# Patient Record
Sex: Female | Born: 1945 | Race: White | Hispanic: No | Marital: Married | Smoking: Never smoker
Health system: Southern US, Community
[De-identification: ages and names within clinical notes are randomized; demographics above are authoritative.]

## PROBLEM LIST (undated history)

## (undated) DIAGNOSIS — Z9289 Personal history of other medical treatment: Secondary | ICD-10-CM

## (undated) DIAGNOSIS — I314 Cardiac tamponade: Secondary | ICD-10-CM

## (undated) DIAGNOSIS — E7800 Pure hypercholesterolemia, unspecified: Secondary | ICD-10-CM

## (undated) DIAGNOSIS — I25700 Atherosclerosis of coronary artery bypass graft(s), unspecified, with unstable angina pectoris: Secondary | ICD-10-CM

## (undated) DIAGNOSIS — L932 Other local lupus erythematosus: Secondary | ICD-10-CM

## (undated) DIAGNOSIS — E119 Type 2 diabetes mellitus without complications: Secondary | ICD-10-CM

## (undated) DIAGNOSIS — M1990 Unspecified osteoarthritis, unspecified site: Secondary | ICD-10-CM

## (undated) DIAGNOSIS — I495 Sick sinus syndrome: Secondary | ICD-10-CM

## (undated) DIAGNOSIS — G459 Transient cerebral ischemic attack, unspecified: Secondary | ICD-10-CM

## (undated) DIAGNOSIS — I10 Essential (primary) hypertension: Secondary | ICD-10-CM

## (undated) DIAGNOSIS — D4959 Neoplasm of unspecified behavior of other genitourinary organ: Secondary | ICD-10-CM

## (undated) DIAGNOSIS — I4892 Unspecified atrial flutter: Secondary | ICD-10-CM

## (undated) DIAGNOSIS — I5030 Unspecified diastolic (congestive) heart failure: Secondary | ICD-10-CM

## (undated) DIAGNOSIS — I5031 Acute diastolic (congestive) heart failure: Secondary | ICD-10-CM

## (undated) DIAGNOSIS — N390 Urinary tract infection, site not specified: Secondary | ICD-10-CM

## (undated) DIAGNOSIS — K219 Gastro-esophageal reflux disease without esophagitis: Secondary | ICD-10-CM

## (undated) DIAGNOSIS — D519 Vitamin B12 deficiency anemia, unspecified: Secondary | ICD-10-CM

## (undated) DIAGNOSIS — I739 Peripheral vascular disease, unspecified: Secondary | ICD-10-CM

## (undated) DIAGNOSIS — I25118 Atherosclerotic heart disease of native coronary artery with other forms of angina pectoris: Secondary | ICD-10-CM

## (undated) DIAGNOSIS — E039 Hypothyroidism, unspecified: Secondary | ICD-10-CM

## (undated) DIAGNOSIS — B369 Superficial mycosis, unspecified: Secondary | ICD-10-CM

## (undated) DIAGNOSIS — I3139 Other pericardial effusion (noninflammatory): Secondary | ICD-10-CM

## (undated) DIAGNOSIS — I459 Conduction disorder, unspecified: Secondary | ICD-10-CM

## (undated) DIAGNOSIS — Z950 Presence of cardiac pacemaker: Secondary | ICD-10-CM

## (undated) DIAGNOSIS — R309 Painful micturition, unspecified: Secondary | ICD-10-CM

## (undated) DIAGNOSIS — I483 Typical atrial flutter: Secondary | ICD-10-CM

## (undated) DIAGNOSIS — I219 Acute myocardial infarction, unspecified: Secondary | ICD-10-CM

## (undated) DIAGNOSIS — I4891 Unspecified atrial fibrillation: Secondary | ICD-10-CM

## (undated) DIAGNOSIS — I509 Heart failure, unspecified: Secondary | ICD-10-CM

## (undated) DIAGNOSIS — I2510 Atherosclerotic heart disease of native coronary artery without angina pectoris: Secondary | ICD-10-CM

## (undated) DIAGNOSIS — I313 Pericardial effusion (noninflammatory): Secondary | ICD-10-CM

## (undated) HISTORY — DX: Atherosclerotic heart disease of native coronary artery without angina pectoris: I25.10

## (undated) HISTORY — DX: Atherosclerosis of coronary artery bypass graft(s), unspecified, with unstable angina pectoris: I25.700

## (undated) HISTORY — DX: Peripheral vascular disease, unspecified: I73.9

## (undated) HISTORY — PX: EYE SURGERY: SHX253

## (undated) HISTORY — DX: Painful micturition, unspecified: R30.9

## (undated) HISTORY — DX: Unspecified diastolic (congestive) heart failure: I50.30

## (undated) HISTORY — DX: Vitamin B12 deficiency anemia, unspecified: D51.9

## (undated) HISTORY — DX: Essential (primary) hypertension: I10

## (undated) HISTORY — DX: Urinary tract infection, site not specified: N39.0

## (undated) HISTORY — DX: Superficial mycosis, unspecified: B36.9

## (undated) HISTORY — DX: Hypothyroidism, unspecified: E03.9

## (undated) HISTORY — DX: Typical atrial flutter: I48.3

## (undated) HISTORY — DX: Conduction disorder, unspecified: I45.9

## (undated) HISTORY — PX: OTHER SURGICAL HISTORY: SHX169

## (undated) HISTORY — DX: Atherosclerotic heart disease of native coronary artery with other forms of angina pectoris: I25.118

## (undated) HISTORY — DX: Unspecified atrial flutter: I48.92

## (undated) HISTORY — DX: Neoplasm of unspecified behavior of other genitourinary organ: D49.59

## (undated) HISTORY — DX: Acute diastolic (congestive) heart failure: I50.31

## (undated) HISTORY — DX: Cardiac tamponade: I31.4

## (undated) HISTORY — DX: Unspecified atrial fibrillation: I48.91

---

## 1969-02-20 HISTORY — PX: APPENDECTOMY: SHX54

## 1969-02-20 HISTORY — PX: EXCISIONAL HEMORRHOIDECTOMY: SHX1541

## 1969-02-20 HISTORY — PX: RIGHT OOPHORECTOMY: SHX2359

## 1969-02-20 HISTORY — PX: PARTIAL HYSTERECTOMY: SHX80

## 1970-06-22 HISTORY — PX: ABDOMINAL HYSTERECTOMY: SHX81

## 1979-02-21 DIAGNOSIS — Z9289 Personal history of other medical treatment: Secondary | ICD-10-CM

## 1979-02-21 HISTORY — DX: Personal history of other medical treatment: Z92.89

## 1979-05-23 HISTORY — PX: COLOSTOMY: SHX63

## 1979-05-23 HISTORY — PX: LEFT OOPHORECTOMY: SHX1961

## 1979-11-21 HISTORY — PX: COLOSTOMY REVERSAL: SHX5782

## 1989-02-20 HISTORY — PX: CHOLECYSTECTOMY OPEN: SUR202

## 1998-06-22 HISTORY — PX: EYE SURGERY: SHX253

## 1999-09-08 ENCOUNTER — Encounter: Payer: Self-pay | Admitting: Ophthalmology

## 1999-09-08 ENCOUNTER — Ambulatory Visit (HOSPITAL_COMMUNITY): Admission: RE | Admit: 1999-09-08 | Discharge: 1999-09-09 | Payer: Self-pay | Admitting: Ophthalmology

## 2001-04-05 ENCOUNTER — Ambulatory Visit (HOSPITAL_COMMUNITY): Admission: RE | Admit: 2001-04-05 | Discharge: 2001-04-05 | Payer: Self-pay | Admitting: Family Medicine

## 2001-04-05 ENCOUNTER — Encounter: Payer: Self-pay | Admitting: Family Medicine

## 2001-04-08 ENCOUNTER — Ambulatory Visit (HOSPITAL_COMMUNITY): Admission: RE | Admit: 2001-04-08 | Discharge: 2001-04-08 | Payer: Self-pay | Admitting: Family Medicine

## 2001-04-08 ENCOUNTER — Encounter: Payer: Self-pay | Admitting: Family Medicine

## 2001-08-26 ENCOUNTER — Encounter: Payer: Self-pay | Admitting: Obstetrics and Gynecology

## 2001-08-26 ENCOUNTER — Ambulatory Visit (HOSPITAL_COMMUNITY): Admission: RE | Admit: 2001-08-26 | Discharge: 2001-08-26 | Payer: Self-pay | Admitting: Obstetrics and Gynecology

## 2001-09-02 ENCOUNTER — Other Ambulatory Visit: Admission: RE | Admit: 2001-09-02 | Discharge: 2001-09-02 | Payer: Self-pay | Admitting: Obstetrics and Gynecology

## 2001-09-17 ENCOUNTER — Inpatient Hospital Stay (HOSPITAL_COMMUNITY): Admission: EM | Admit: 2001-09-17 | Discharge: 2001-09-22 | Payer: Self-pay | Admitting: Emergency Medicine

## 2001-09-17 ENCOUNTER — Encounter: Payer: Self-pay | Admitting: Emergency Medicine

## 2001-09-19 ENCOUNTER — Encounter: Payer: Self-pay | Admitting: Neurology

## 2001-09-19 ENCOUNTER — Encounter: Payer: Self-pay | Admitting: Family Medicine

## 2001-09-21 ENCOUNTER — Encounter: Payer: Self-pay | Admitting: Internal Medicine

## 2001-09-29 ENCOUNTER — Ambulatory Visit (HOSPITAL_COMMUNITY): Admission: RE | Admit: 2001-09-29 | Discharge: 2001-09-29 | Payer: Self-pay | Admitting: Cardiology

## 2001-09-29 ENCOUNTER — Encounter: Payer: Self-pay | Admitting: Cardiology

## 2002-09-01 ENCOUNTER — Ambulatory Visit (HOSPITAL_COMMUNITY): Admission: RE | Admit: 2002-09-01 | Discharge: 2002-09-01 | Payer: Self-pay | Admitting: Obstetrics and Gynecology

## 2002-09-01 ENCOUNTER — Encounter: Payer: Self-pay | Admitting: Obstetrics and Gynecology

## 2003-06-23 HISTORY — PX: COLONOSCOPY: SHX174

## 2003-08-16 ENCOUNTER — Emergency Department (HOSPITAL_COMMUNITY): Admission: EM | Admit: 2003-08-16 | Discharge: 2003-08-16 | Payer: Self-pay | Admitting: Emergency Medicine

## 2003-09-11 ENCOUNTER — Ambulatory Visit (HOSPITAL_COMMUNITY): Admission: RE | Admit: 2003-09-11 | Discharge: 2003-09-11 | Payer: Self-pay | Admitting: Obstetrics and Gynecology

## 2004-01-22 ENCOUNTER — Ambulatory Visit (HOSPITAL_COMMUNITY): Admission: RE | Admit: 2004-01-22 | Discharge: 2004-01-22 | Payer: Self-pay | Admitting: Internal Medicine

## 2004-11-20 ENCOUNTER — Ambulatory Visit (HOSPITAL_COMMUNITY): Admission: RE | Admit: 2004-11-20 | Discharge: 2004-11-20 | Payer: Self-pay | Admitting: Obstetrics and Gynecology

## 2004-11-28 ENCOUNTER — Ambulatory Visit (HOSPITAL_COMMUNITY): Admission: RE | Admit: 2004-11-28 | Discharge: 2004-11-28 | Payer: Self-pay | Admitting: Obstetrics & Gynecology

## 2005-12-11 ENCOUNTER — Ambulatory Visit (HOSPITAL_COMMUNITY): Admission: RE | Admit: 2005-12-11 | Discharge: 2005-12-11 | Payer: Self-pay | Admitting: Obstetrics and Gynecology

## 2006-06-22 HISTORY — PX: CARDIAC CATHETERIZATION: SHX172

## 2006-07-13 ENCOUNTER — Ambulatory Visit: Payer: Self-pay | Admitting: Cardiology

## 2006-07-21 ENCOUNTER — Ambulatory Visit: Payer: Self-pay

## 2006-07-27 ENCOUNTER — Ambulatory Visit: Payer: Self-pay | Admitting: Cardiology

## 2006-07-27 LAB — CONVERTED CEMR LAB
BUN: 19 mg/dL (ref 6–23)
Basophils Relative: 0.8 % (ref 0.0–1.0)
CO2: 29 meq/L (ref 19–32)
Calcium: 9.5 mg/dL (ref 8.4–10.5)
Chloride: 113 meq/L — ABNORMAL HIGH (ref 96–112)
Eosinophils Relative: 2.2 % (ref 0.0–5.0)
GFR calc Af Amer: 82 mL/min
Glucose, Bld: 68 mg/dL — ABNORMAL LOW (ref 70–99)
INR: 1 (ref 0.9–2.0)
Lymphocytes Relative: 24.3 % (ref 12.0–46.0)
Monocytes Relative: 7.2 % (ref 3.0–11.0)
Neutro Abs: 4.2 10*3/uL (ref 1.4–7.7)
Platelets: 290 10*3/uL (ref 150–400)
Prothrombin Time: 12.8 s (ref 10.0–14.0)
RBC: 4.08 M/uL (ref 3.87–5.11)
RDW: 13.2 % (ref 11.5–14.6)
WBC: 6.2 10*3/uL (ref 4.5–10.5)

## 2006-07-29 ENCOUNTER — Ambulatory Visit: Payer: Self-pay | Admitting: Internal Medicine

## 2006-07-29 ENCOUNTER — Inpatient Hospital Stay (HOSPITAL_BASED_OUTPATIENT_CLINIC_OR_DEPARTMENT_OTHER): Admission: RE | Admit: 2006-07-29 | Discharge: 2006-07-29 | Payer: Self-pay | Admitting: Internal Medicine

## 2006-08-12 ENCOUNTER — Ambulatory Visit: Payer: Self-pay | Admitting: Cardiology

## 2006-10-26 ENCOUNTER — Ambulatory Visit: Payer: Self-pay | Admitting: Cardiology

## 2006-12-17 ENCOUNTER — Ambulatory Visit (HOSPITAL_COMMUNITY): Admission: RE | Admit: 2006-12-17 | Discharge: 2006-12-17 | Payer: Self-pay | Admitting: Obstetrics and Gynecology

## 2007-04-27 ENCOUNTER — Ambulatory Visit: Payer: Self-pay | Admitting: Cardiology

## 2007-11-11 ENCOUNTER — Ambulatory Visit: Payer: Self-pay | Admitting: Cardiology

## 2007-12-05 ENCOUNTER — Encounter: Payer: Self-pay | Admitting: Orthopedic Surgery

## 2007-12-05 ENCOUNTER — Emergency Department (HOSPITAL_COMMUNITY): Admission: EM | Admit: 2007-12-05 | Discharge: 2007-12-05 | Payer: Self-pay | Admitting: Emergency Medicine

## 2007-12-06 ENCOUNTER — Ambulatory Visit: Payer: Self-pay | Admitting: Orthopedic Surgery

## 2007-12-06 ENCOUNTER — Encounter: Payer: Self-pay | Admitting: Infectious Diseases

## 2007-12-06 DIAGNOSIS — S92919A Unspecified fracture of unspecified toe(s), initial encounter for closed fracture: Secondary | ICD-10-CM

## 2007-12-06 HISTORY — DX: Unspecified fracture of unspecified toe(s), initial encounter for closed fracture: S92.919A

## 2008-01-13 ENCOUNTER — Ambulatory Visit (HOSPITAL_COMMUNITY): Admission: RE | Admit: 2008-01-13 | Discharge: 2008-01-13 | Payer: Self-pay | Admitting: Obstetrics and Gynecology

## 2008-02-24 ENCOUNTER — Other Ambulatory Visit: Admission: RE | Admit: 2008-02-24 | Discharge: 2008-02-24 | Payer: Self-pay | Admitting: Obstetrics and Gynecology

## 2008-03-01 ENCOUNTER — Encounter: Payer: Self-pay | Admitting: Orthopedic Surgery

## 2008-04-25 ENCOUNTER — Ambulatory Visit: Payer: Self-pay | Admitting: Cardiology

## 2008-05-06 ENCOUNTER — Emergency Department (HOSPITAL_COMMUNITY): Admission: EM | Admit: 2008-05-06 | Discharge: 2008-05-06 | Payer: Self-pay | Admitting: Emergency Medicine

## 2008-05-16 ENCOUNTER — Ambulatory Visit (HOSPITAL_COMMUNITY): Admission: RE | Admit: 2008-05-16 | Discharge: 2008-05-16 | Payer: Self-pay | Admitting: Family Medicine

## 2008-05-23 ENCOUNTER — Ambulatory Visit: Payer: Self-pay

## 2008-05-23 ENCOUNTER — Encounter (INDEPENDENT_AMBULATORY_CARE_PROVIDER_SITE_OTHER): Payer: Self-pay | Admitting: Family Medicine

## 2008-05-29 ENCOUNTER — Ambulatory Visit: Payer: Self-pay | Admitting: Cardiology

## 2008-06-28 ENCOUNTER — Ambulatory Visit: Payer: Self-pay | Admitting: Cardiology

## 2008-09-27 ENCOUNTER — Ambulatory Visit: Payer: Self-pay | Admitting: Cardiology

## 2009-05-17 ENCOUNTER — Ambulatory Visit: Payer: Self-pay | Admitting: Cardiology

## 2009-05-17 DIAGNOSIS — R002 Palpitations: Secondary | ICD-10-CM | POA: Insufficient documentation

## 2009-06-03 ENCOUNTER — Ambulatory Visit (HOSPITAL_COMMUNITY): Admission: RE | Admit: 2009-06-03 | Discharge: 2009-06-03 | Payer: Self-pay | Admitting: Obstetrics and Gynecology

## 2009-06-26 ENCOUNTER — Other Ambulatory Visit: Admission: RE | Admit: 2009-06-26 | Discharge: 2009-06-26 | Payer: Self-pay | Admitting: Obstetrics and Gynecology

## 2009-12-27 ENCOUNTER — Ambulatory Visit: Payer: Self-pay | Admitting: Cardiology

## 2009-12-27 DIAGNOSIS — E782 Mixed hyperlipidemia: Secondary | ICD-10-CM | POA: Insufficient documentation

## 2009-12-30 ENCOUNTER — Encounter: Payer: Self-pay | Admitting: Cardiology

## 2010-02-13 ENCOUNTER — Encounter: Payer: Self-pay | Admitting: Cardiology

## 2010-02-17 ENCOUNTER — Encounter: Payer: Self-pay | Admitting: Cardiology

## 2010-02-17 LAB — CONVERTED CEMR LAB
ALT: 12 units/L (ref 0–35)
Albumin: 4.5 g/dL (ref 3.5–5.2)
Alkaline Phosphatase: 63 units/L (ref 39–117)
Cholesterol: 165 mg/dL (ref 0–200)
HDL: 32 mg/dL — ABNORMAL LOW (ref >39)
Indirect Bilirubin: 0.3 mg/dL (ref 0.0–0.9)
Total Protein: 6.2 g/dL (ref 6.0–8.3)
Triglycerides: 264 mg/dL — ABNORMAL HIGH (ref <150)
VLDL: 53 mg/dL — ABNORMAL HIGH (ref 0–40)

## 2010-03-12 ENCOUNTER — Encounter (INDEPENDENT_AMBULATORY_CARE_PROVIDER_SITE_OTHER): Payer: Self-pay | Admitting: *Deleted

## 2010-06-03 ENCOUNTER — Encounter: Payer: Self-pay | Admitting: Cardiology

## 2010-06-03 ENCOUNTER — Ambulatory Visit: Payer: Self-pay | Admitting: Cardiology

## 2010-06-03 DIAGNOSIS — I10 Essential (primary) hypertension: Secondary | ICD-10-CM

## 2010-06-03 DIAGNOSIS — E663 Overweight: Secondary | ICD-10-CM | POA: Insufficient documentation

## 2010-06-22 HISTORY — PX: COLONOSCOPY: SHX174

## 2010-07-22 NOTE — Letter (Signed)
Summary: [place] [facility], general practice at [place]. [address], [place] [address]   Phone: [phone]  Fax: [phone]      February 17, 2010 MRN: [id number]   [facility] [address] [place], [place]  [address]   Dear Ms. Rathbun,  Your test ordered by Rande Lawman has been reviewed by your physician (or physician assistant) and was found to be normal or stable. Your physician (or physician assistant) felt no changes were needed at this time.  ____ Echocardiogram  ____ Cardiac Stress Test  __X__ Lab Work- Please decrease weight and the amount of carbs in your diet.  ____ Peripheral vascular study of arms, legs or neck  ____ CT scan or X-ray  ____ Lung or Breathing test  ____ Other: Continue on current medical treatment.  Thank you.   Jenell Milliner, MD, F.A.C.C

## 2010-07-22 NOTE — Letter (Signed)
Summary: [place] [facility] Work Doctor, general practice at [place]. [address], [place] [address]   Phone: [phone]  Fax: [phone]     December 27, 2009 MRN: [id number]   [name] [address] [place], [place]  [address]      YOUR LAB WORK IS DUE  March 03, 2010 _________________________________________  Please go to [facility], located across the street from [facility] on the second floor.  Hours are Monday - Friday 7am until 7:30pm         Saturday 8am until 12noon    __  DO NOT EAT OR DRINK AFTER MIDNIGHT EVENING PRIOR TO LABWORK  __ YOUR LABWORK IS NOT FASTING --YOU MAY EAT PRIOR TO LABWORK

## 2010-07-22 NOTE — Miscellaneous (Signed)
  Clinical Lists Changes  Medications: Changed medication from LABETALOL HCL 200 MG TABS (LABETALOL HCL) Take 2 tablets by mouth twice a day to LABETALOL HCL 200 MG TABS (LABETALOL HCL) Take 1 tablet by mouth two times a day - Signed Rx of LABETALOL HCL 200 MG TABS (LABETALOL HCL) Take 1 tablet by mouth two times a day;  #60 x 3;  Signed;  Entered by: Tye Savoy RN;  Authorized by: Renella Cunas, MD, [facility];  Method used: Electronically to [facility]*, [id number] [place], [place], [place]  [address], Ph: [phone] or [phone], Fax: [phone]    Prescriptions: LABETALOL HCL 200 MG TABS (LABETALOL HCL) Take 1 tablet by mouth two times a day  #60 x 3   Entered by:   Tye Savoy RN   Authorized by:   Renella Cunas, MD, [facility]   Signed by:   Tye Savoy RN on 03/12/2010   Method used:   Electronically to        [facility] (retail)       [address]       [place], [place]  [address]       Ph: [phone] or [phone]       Fax: [phone]   RxID:   [id number]

## 2010-07-22 NOTE — Assessment & Plan Note (Signed)
Summary: Virginia Huber  Medications Added LANTUS 100 UNIT/ML SOLN (INSULIN GLARGINE) 75 units METFORMIN HCL 500 MG TABS (METFORMIN HCL) 2 tabs in the morning LABETALOL HCL 200 MG TABS (LABETALOL HCL) Take 2 tablets by mouth twice a day LEVOTHROID 137 MCG TABS (LEVOTHYROXINE SODIUM) take 1 tab daily PRAVASTATIN SODIUM 80 MG TABS (PRAVASTATIN SODIUM) take 1 tablet by mouth at bedtime LOSARTAN POTASSIUM 100 MG TABS (LOSARTAN POTASSIUM) take 1 tab daily      Allergies Added:   Visit Type:  Follow-up Primary Provider:  Caron Presume  CC:  pt taking some nitro.  History of Present Illness: Virginia Huber comes in today for evaluation and management of her small vessel coronary artery disease. She has normal left ventricular function.  She taken about 3 or 4 nitroglycerin, one at a time over the past several months since I last saw her.  She is now on insulin and has gained about 3 pounds. She is very discouraged. Her she denies palpitations, orthopnea, or PND. She has had no significant edema.  I note she is on simvastatin and amlodipine. We'll make appropriate changes.  Current Medications (verified): 1)  Lantus 100 Unit/ml Soln (Insulin Glargine) .... 75 Units 2)  Metformin Hcl 500 Mg Tabs (Metformin Hcl) .... 2 Tabs in The Morning 3)  Actos 15 Mg Tabs (Pioglitazone Hcl) .... Once Daily 4)  Labetalol Hcl 200 Mg Tabs (Labetalol Hcl) .... Take 2 Tablets By Mouth Twice A Day 5)  Levothroid 137 Mcg Tabs (Levothyroxine Sodium) .... Take 1 Tab Daily 6)  Amlodipine Besylate 10 Mg Tabs (Amlodipine Besylate) .... Take One Tablet By Mouth Daily 7)  Diovan 320 Mg Tabs (Valsartan) .... Take One Tablet By Mouth Daily 8)  Aspirin Ec 325 Mg Tbec (Aspirin) .... Take One Tablet By Mouth Daily 9)  Pravastatin Sodium 80 Mg Tabs (Pravastatin Sodium) .... Take 1 Tablet By Mouth At Bedtime 10)  Vitamin D 2000 Unit Tabs (Cholecalciferol) .... Once Daily 11)  Vitamin D (Ergocalciferol) 50000 Unit Caps (Ergocalciferol)  .... Weekly 12)  Losartan Potassium 100 Mg Tabs (Losartan Potassium) .... Take 1 Tab Daily  Allergies (verified): 1)  ! Pcn  Past History:  Past Medical History: Last updated: 12/06/2007 htn diabetes heart blockage thyroid  Past Surgical History: Last updated: 12/06/2007 gallbladder hysterectomy ovaries removed colostomy reversed colostomy  Family History: Last updated: 12/06/2007 FH of Cancer:  Family History of Diabetes Family History Coronary Heart Disease female < 70  Social History: Last updated: 12/06/2007 Patient is married.  retired  Risk Factors: Caffeine Use: 1 (12/06/2007)  Risk Factors: Smoking Status: never (12/06/2007)  Review of Systems       negative other than history of present illness  Vital Signs:  Patient profile:   65 year old female Weight:      162 pounds Pulse rate:   76 / minute BP sitting:   134 / 70  (right arm)  Vitals Entered ByJeani Hawking Via LPN (July  8, [id number] [id number] PM)  Physical Exam  General:  obese.   Head:  normocephalic and atraumatic Eyes:  PERRLA/EOM intact; conjunctiva and lids normal. Neck:  Neck supple, no JVD. No masses, thyromegaly or abnormal cervical nodes. Chest Anahlia Iseminger:  no deformities or breast masses noted Lungs:  Clear bilaterally to auscultation and percussion. Heart:  Non-displaced PMI, chest non-tender; regular rate and rhythm, S1, S2 without murmurs, rubs or gallops. Carotid upstroke normal, no bruit. Normal abdominal aortic size, no bruits. Femorals normal pulses, no bruits. Pedals normal pulses. No  edema, no varicosities. Msk:  Back normal, normal gait. Muscle strength and tone normal. Pulses:  pulses normal in all 4 extremities Extremities:  No clubbing or cyanosis. Neurologic:  Alert and oriented x 3. Skin:  Intact without lesions or rashes. Psych:  Normal affect.   Problems:  Medical Problems Added: 1)  Dx of Hyperlipidemia-mixed  (ICD-272.4) 2)  Dx of Hyperlipidemia-mixed   (ICD-272.4)  EKG  Procedure date:  12/27/2009  Findings:      normal sinus rhythm, left anterior fascicular block, LVH with strain, no significant change.  Impression & Recommendations:  Problem # 1:  CAD, NATIVE VESSEL (ICD-414.01) Assessment Unchanged  Her updated medication list for this problem includes:    Labetalol Hcl 200 Mg Tabs (Labetalol hcl) .[name]... Take 2 tablets by mouth twice a day    Amlodipine Besylate 10 Mg Tabs (Amlodipine besylate) .[name]... Take one tablet by mouth daily    Aspirin Ec 325 Mg Tbec (Aspirin) .[name]... Take one tablet by mouth daily  Problem # 2:  PALPITATIONS (ICD-785.1) Assessment: Improved  Her updated medication list for this problem includes:    Labetalol Hcl 200 Mg Tabs (Labetalol hcl) .[name]... Take 2 tablets by mouth twice a day    Amlodipine Besylate 10 Mg Tabs (Amlodipine besylate) .[name]... Take one tablet by mouth daily    Aspirin Ec 325 Mg Tbec (Aspirin) .[name]... Take one tablet by mouth daily  Her updated medication list for this problem includes:    Labetalol Hcl 200 Mg Tabs (Labetalol hcl) .[name]... Take 2 tablets by mouth twice a day    Amlodipine Besylate 10 Mg Tabs (Amlodipine besylate) .[name]... Take one tablet by mouth daily    Aspirin Ec 325 Mg Tbec (Aspirin) .[name]... Take one tablet by mouth daily  Problem # 3:  HYPERLIPIDEMIA-MIXED (ICD-272.4)  per recent guidelines, will discontinue simvastatin and place on pravastatin 80 mg per day. She'll continue with amlodipine. She'll need lipids and LFTs in about 6-8 weeks after the change. Her updated medication list for this problem includes:    Simvastatin 40 Mg Tabs (Simvastatin) .[name]... Take one tablet by mouth daily at bedtime  Her updated medication list for this problem includes:    Pravastatin Sodium 80 Mg Tabs (Pravastatin sodium) .[name]... Take 1 tablet by mouth at bedtime  Future Orders: T-Lipid Profile [id number]) ... 03/03/2010 T-Hepatic Function [id number]) ... 03/03/2010  Patient  Instructions: 1)  Your physician recommends that you schedule a follow-up appointment in: 6 months 2)  Your physician recommends that you return for lab work in: 6 to 8 weeks after starting Pravastatin 3)  Your physician has recommended you make the following change in your medication: Stop taking Simvastatin and start taking Pravastatin 80mg  by mouth at bedtime  Prescriptions: PRAVASTATIN SODIUM 80 MG TABS (PRAVASTATIN SODIUM) take 1 tablet by mouth at bedtime  #90 x 1   Entered by:   Jeani Hawking Via LPN   Authorized by:   Renella Cunas, MD, [facility]   Signed by:   Jeani Hawking Via LPN on [id number]   Method used:   Print then Give to Patient   [id number]   Prevention & Chronic Care Immunizations   Influenza vaccine: Not documented    Tetanus booster: Not documented    Pneumococcal vaccine: Not documented    H. zoster vaccine: Not documented  Colorectal Screening   Hemoccult: Not documented    Colonoscopy: Not documented  Other Screening   Pap smear: Not documented    Mammogram: Not documented  DXA bone density scan: Not documented   Smoking status: never  (12/06/2007)  Lipids   Total Cholesterol: Not documented   LDL: Not documented   LDL Direct: Not documented   HDL: Not documented   Triglycerides: Not documented    SGOT (AST): Not documented   SGPT (ALT): Not documented   Alkaline phosphatase: Not documented   Total bilirubin: Not documented  Self-Management Support :    Lipid self-management support: Not documented

## 2010-07-24 NOTE — Assessment & Plan Note (Signed)
Summary: Virginia Huber  Medications Added LANTUS 100 UNIT/ML SOLN (INSULIN GLARGINE) 35 units METFORMIN HCL 500 MG TABS (METFORMIN HCL) 2 tabs in the morning 1 tab at night AMLODIPINE BESYLATE 5 MG TABS (AMLODIPINE BESYLATE) take 1 tab daily BYETTA 5 MCG PEN 5 MCG/0.02ML SOLN (EXENATIDE) 1 injection two times a day        Visit Type:  Follow-up Primary Aidan Caloca:  Dr.Golding  CC:  no cardiology complaints.  History of Present Illness: Virginia Huber comes in today for evaluation and management of her small vessel coronary artery disease, hypertension, hyperlipidemia, diabetes, and obesity.  She went home [place] for blood sugar control. She has lost 23 pounds down from 160-139. Her hemoglobin A1c was 6.2%. She feels remarkably better and her stamina and exercise tolerance has improved. She exercises on a regular basis. Her blood pressure runs about 130/70. He is up today in the office at 150/70.  She denies any angina or ischemia. In fact she has not taken any nitroglycerin.  Her cholesterol was 158, treated with draws 188, LDL 91, HDL 29. She is on maximum dose of pravastatin. She has not tolerated the other statins as well.  She's very concerned about being able to pay for her medications now she is on Medicare. He is certainly not listed and adding more medications.  She denies orthopnea, PND or edema.   Current Medications (verified): 1)  Lantus 100 Unit/ml Soln (Insulin Glargine) .... 35 Units 2)  Metformin Hcl 500 Mg Tabs (Metformin Hcl) .... 2 Tabs in The Morning 1 Tab At Night 3)  Actos 15 Mg Tabs (Pioglitazone Hcl) .... Once Daily 4)  Labetalol Hcl 200 Mg Tabs (Labetalol Hcl) .... Take 1 Tablet By Mouth Two Times A Day 5)  Levothroid 137 Mcg Tabs (Levothyroxine Sodium) .... Take 1 Tab Daily 6)  Amlodipine Besylate 5 Mg Tabs (Amlodipine Besylate) .... Take 1 Tab Daily 7)  Aspirin Ec 325 Mg Tbec (Aspirin) .... Take One Tablet By Mouth Daily 8)  Pravastatin Sodium 80 Mg Tabs (Pravastatin  Sodium) .... Take 1 Tablet By Mouth At Bedtime 9)  Vitamin D 2000 Unit Tabs (Cholecalciferol) .... Once Daily 10)  Losartan Potassium 100 Mg Tabs (Losartan Potassium) .... Take 1 Tab Daily 11)  [place] 5 Mcg Pen 5 Mcg/0.67ml Soln (Exenatide) .[name].. 1 Injection Two Times A Day  Allergies: 1)  ! Pcn  Comments:  Nurse/Medical Assistant: patient brought med list the changes are diovan stopped amlodipine downed to 5 mg daily and byetta started 5 micrograms two times a day [facility] patients drug store  Past History:  Past Medical History: Last updated: 12/06/2007 htn diabetes heart blockage thyroid  Past Surgical History: Last updated: 12/06/2007 gallbladder hysterectomy ovaries removed colostomy reversed colostomy  Family History: Last updated: 12/06/2007 FH of Cancer:  Family History of Diabetes Family History Coronary Heart Disease female < 55  Social History: Last updated: 12/06/2007 Patient is married.  retired  Risk Factors: Caffeine Use: 1 (12/06/2007)  Risk Factors: Smoking Status: never (12/06/2007)  Clinical Reports Reviewed:  Cardiac Cath:  07/29/2006: Cardiac Cath Findings:   PLAN/DISCUSSION:  Ms. Delcour does have a focal high-grade lesion in  the ostium of her OM-2, however, this is a small vessel and there is  some disease in the left circumflex at the takeoff of the vessel and  thus I think any attempts at intervening on this would compromise the  circulation to the much larger OM-3 branch.  Her OM-2 vessel __________  a very,  very tiny amount of myocardium.  Thus will continue with medical  therapy.  We will add Imdur 30 mg a day.  If she continues to have chest  pain, we could consider cardiac rehab for chronic stable angina.   Shaune Pascal. Bensimhon, MD  Electronically Signed   DRB/MEDQ  D:  07/29/2006  T:  07/29/2006  Job:  [id number]   Review of Systems       negative other than history of present illness  Vital  Signs:  Patient profile:   65 year old female Weight:      139 pounds BMI:     24.71 O2 Sat:      95 % on Room air Pulse rate:   66 / minute BP sitting:   181 / 79  (right arm)  Vitals Entered By: Doretha Sou, CNA (June 03, 2010 9:27 AM)  O2 Flow:  Room air  Physical Exam  General:  no acute distress, she is only slightly overweight. Head:  normocephalic and atraumatic Eyes:  PERRLA/EOM intact; conjunctiva and lids normal. Neck:  Neck supple, no JVD. No masses, thyromegaly or abnormal cervical nodes. Chest Wall:  no deformities or breast masses noted Lungs:  Clear bilaterally to auscultation and percussion. Heart:  PMI not displaced, normal S1-S2, regular rate and rhythm, no carotid bruits Abdomen:  soft, good bowel sounds, nontender Msk:  Back normal, normal gait. Muscle strength and tone normal. Pulses:  pulses normal in all 4 extremities Extremities:  No clubbing or cyanosis. Neurologic:  Alert and oriented x 3. Skin:  Intact without lesions or rashes. Psych:  Normal affect.   Problems:  Medical Problems Added: 1)  Dx of Hypertension, Unspecified  (ICD-401.9) 2)  Dx of Overweight/obesity  (ICD-278.02)  Impression & Recommendations:  Problem # 1:  CAD, NATIVE VESSEL (ICD-414.01) Assessment Unchanged  Her updated medication list for this problem includes:    Labetalol Hcl 200 Mg Tabs (Labetalol hcl) .[name]... Take 1 tablet by mouth two times a day    Amlodipine Besylate 5 Mg Tabs (Amlodipine besylate) .[name]... Take 1 tab daily    Aspirin Ec 325 Mg Tbec (Aspirin) .[name]... Take one tablet by mouth daily  Problem # 2:  HYPERLIPIDEMIA-MIXED (ICD-272.4) She Is not at goal on maximum pravastatin. She is not answer and adding any other medications because of cost. Perhaps he'll continue to improve and she gets her weight down further. Her updated medication list for this problem includes:    Pravastatin Sodium 80 Mg Tabs (Pravastatin sodium) .[name]... Take 1 tablet by mouth at  bedtime  Problem # 3:  OVERWEIGHT/OBESITY (ICD-278.02) Assessment: Improved  Problem # 4:  HYPERTENSION, UNSPECIFIED (ICD-401.9) Assessment: Unchanged  The following medications were removed from the medication list:    Diovan 320 Mg Tabs (Valsartan) .[name]... Take one tablet by mouth daily Her updated medication list for this problem includes:    Labetalol Hcl 200 Mg Tabs (Labetalol hcl) .[name]... Take 1 tablet by mouth two times a day    Amlodipine Besylate 5 Mg Tabs (Amlodipine besylate) .[name]... Take 1 tab daily    Aspirin Ec 325 Mg Tbec (Aspirin) .[name]... Take one tablet by mouth daily    Losartan Potassium 100 Mg Tabs (Losartan potassium) .[name]... Take 1 tab daily  Patient Instructions: 1)  Your physician recommends that you schedule a follow-up appointment in: 6 months 2)  Your physician recommends that you continue on your current medications as directed. Please refer to the Current Medication list given to you today.  Prescriptions: LOSARTAN POTASSIUM 100 MG TABS (LOSARTAN POTASSIUM) take 1 tab daily  #90 x 3   Entered by:   Tye Savoy RN   Authorized by:   Renella Cunas, MD, [facility]   Signed by:   Tye Savoy RN on 06/03/2010   Method used:   Electronically to        [facility] (retail)       [address]       [place], [place]  [address]       Ph: [phone] or [phone]       Fax: [phone]   RxID:   [id number]

## 2010-11-04 NOTE — Assessment & Plan Note (Signed)
[place] OFFICE NOTE   NAME:Dobransky, CANICE BALTHAZAR                      MRN:          [id number]  DATE:04/27/2007                            DOB:          10/29/1945    Ms. Cartagena comes in today for followup of her nonobstructive disease  other than a 90% very small obtuse marginal 2.   She has gained back 4 out of her 5 pounds that she lost on a cruise to  the [place].  She had a great time.   She feels well except for some occasional restless legs and cramps at  night.  She has still not increased her Neurontin, even though Dr.  Caron Presume and I have asked her to do that.   MEDICATIONS:  Otherwise unchanged.   Her blood pressure today is 140/76, her pulse is 64 and regular, weight  is 153 up 4.  HEENT:  Normocephalic/atraumatic, PERRLA, extraocular movements intact,  sclerae are clear, facial symmetry is normal.  Carotids are full without  bruits, there is no JVD, thyroid is not enlarged, trachea is midline.  LUNGS:  Clear to auscultation.  HEART:  Regular rate and rhythm, normal S1-S2.  ABDOMINAL:  Soft with good bowel sounds, no midline bruit.  EXTREMITIES:  Reveal no edema, pulses are intact.   EKG today shows sinus rhythm with left ventricular hypertrophy  repolarization changes.  There has been no significant change from  previous ECG.   Ms. Digenova is stable from a cardiac standpoint.  I am concerned about  her weight gain and suspect that her hemoglobin A1c has increased as  well.  I have advised her to increased her Neurontin as previously  outlined.  We will see her back again in 6 months.     Thomas C. Verl Blalock, MD, [facility]  Electronically Signed    TCW/MedQ  DD: 04/27/2007  DT: 04/28/2007  Job #: [id number]   cc:   Bonne Dolores, M.D.

## 2010-11-04 NOTE — Assessment & Plan Note (Signed)
[place] OFFICE NOTE   NAME:Huber, Virginia ATHERHOLT                      MRN:          [id number]  DATE:06/28/2008                            DOB:          [birth year]    Virginia Huber returns today for followup of her blood pressure.   Please see my note from May 29, 2008.   We increased her Diovan to 320 mg a day and labetalol to 400 b.i.d.  I  also increased her aspirin back to 325 mg a day.  She had an episode  which was concerning for a TIA.   Since that time, she has had no further spells.  She is really having a  difficult time controlling her blood sugar.  She is trying to lose  weight.   She is very compliant and very knowledgeable.   I did retrieve her carotid Dopplers on May 16, 2008, which showed  nonobstructive plaque and antegrade flow in both vertebrals.   Her rest of the medications were unchanged.   Her blood pressure today is 120/64, pulse is 68 and regular, weight is  157, down 2.  She is in no acute distress.  HEENT is unchanged.  Carotids are full.  No bruits.  Thyroid is not enlarged.  Lungs are  clear.  Heart reveals a regular rate and rhythm.  Extremities reveal no  edema.  Pulses are intact.   ASSESSMENT AND PLAN:  Mr. Bibbins's blood pressure is under much better  control.  I have advised her to try to tighten up her blood sugar and  not changing her other medical program.  If she has no other spells and  is doing well, I will see her back again in 6 months.     Thomas C. Verl Blalock, MD, [facility]  Electronically Signed    TCW/MedQ  DD: 06/28/2008  DT: 06/29/2008  Job #: [id number]   cc:   Bonne Dolores, M.D.

## 2010-11-04 NOTE — Assessment & Plan Note (Signed)
[place] OFFICE NOTE   NAME:Huether, KLEE BREIDING                      MRN:          [id number]  DATE:04/25/2008                            DOB:          1945-07-28    Ms. Goeser comes in today for followup.  She has had very little  angina off of her Imdur.  Please refer to my note on Nov 11, 2007.   She has nonobstructive disease for a very small 90% obtuse marginal II  branch.  She has normal left ventricular systolic function.   She has gained a little bit of weight on her recent trip out [place] with  her husband.   Her medications are unchanged otherwise other than the deletion of  isosorbide.   PHYSICAL EXAMINATION:  VITAL SIGNS:  Her blood pressure today was  elevated at 168/70.  She states at home, it runs around 140/60-70.  Her  weight is 155.  HEENT:  Normal.  NECK:  Carotid upstrokes are equal bilaterally without bruits.  No JVD.  Thyroid is not enlarged.  Trachea is midline.  LUNGS:  Clear.  HEART:  Soft S1 and S2.  PMI could not be appreciated.  ABDOMEN:  Soft and good bowel sounds.  EXTREMITIES:  No cyanosis, clubbing, or edema.  Pulses are intact.  NEUROLOGIC:  Intact.   EKG shows sinus rhythm with ST-segment depression laterally and slightly  inferiorly.  This is unchanged.   Ms. Heinke is doing well.  I have encouraged her to lose weight.  I  have given her prescription for sublingual nitroglycerin.  I will see  her back in a year.     Thomas C. Verl Blalock, MD, [facility]  Electronically Signed    TCW/MedQ  DD: 04/25/2008  DT: 04/26/2008  Job #: [id number]   cc:   Bonne Dolores, M.D.

## 2010-11-04 NOTE — Assessment & Plan Note (Signed)
[place] OFFICE NOTE   NAME:Huber, Virginia SKOK                      MRN:          [id number]  DATE:11/11/2007                            DOB:          [birth year]    Virginia Huber comes in today for further management of her coronary  artery disease.  She says she has nonobstructive disease except for a  90%, very small obtuse marginal II branch.   She is feeling well with no angina or ischemic symptoms.  She would like  to come off some of her medications.  Her blood pressure has been under  good control at home and her diabetes is doing better.   Her meds are unchanged since her last visit, except her aspirin is now  81 mg a day.   PHYSICAL EXAMINATION:  VITAL SIGNS:  Her blood pressure is 138/72, her  pulse is 70 and regular, weight is 153.  HEENT:  Unchanged.  NECK:  Supple without tenderness.  Carotid upstrokes are equal  bilaterally without bruits.  No JVD.  Thyroid is not enlarged.  LUNGS:  Clear.  HEART:  Reveals a regular rate and rhythm without murmur or gallop.  ABDOMEN:  Soft.  Good bowel sounds.  EXTREMITIES:  No edema.  Pulses are intact.  NEURO:  Intact.   I had a nice chat with Virginia Huber today.  She really wants to come  off of some of her medications.  I think with her minimal coronary  disease and this only very small obtuse marginal branch blockage, we can  stop her isosorbide mononitrate.  She is only on 30 mg a day.  She will  continue with her Norvasc, her aspirin and her labetalol.   I will plan on seeing her back in 6 months.     Thomas C. Verl Blalock, MD, [facility]  Electronically Signed    TCW/MedQ  DD: 11/11/2007  DT: 11/11/2007  Job #: [id number]   cc:   Bonne Dolores, M.D.

## 2010-11-04 NOTE — Assessment & Plan Note (Signed)
[facility] OFFICE NOTE   NAME:Pruett, SHAWNTAL CONGO                      MRN:          [id number]  DATE:09/27/2008                            DOB:          02-11-1946    Ms. Milani comes in today for followup.  She really comes in  complaining of an irregular heartbeat or palpitations that are  intermittent and feel like they go into her throat.  They are associated  with a little chest discomfort.  It does not sound like angina.  They  are exertion related.  She has had no syncope.  She has had no sustained  irregularity or palpitations.   She also complains of aching in both lateral thighs at the end of the  day.  She is having trouble with leg aches at night and cannot sleep.  She was told in the past, she had neuralgia or neuropathy and had been  on gabapentin.  She has stopped this.   She has known coronary artery disease that we were treating at this  point medically.  Please see my previous notes as well as a cath report  from July 29, 2006.   She unfortunately has not lost any weight as I have suggested.  Her  weight is up to 163 from 157.   Her meds are outlined in the chart and include Zocor 40 mg at bedtime.  That is the only medication I could see that potentially could be the  cause of her arthralgias and her aches.   PHYSICAL EXAMINATION:  VITAL SIGNS:  Her blood pressure today is 140/79,  pulse is 58, and her EKG shows sinus rhythm with PVCs.  Her weight is up  163.  Respiratory rate is 18 and unlabored.  GENERAL:  Alert and oriented x3.  HEENT:  Normal.  SKIN:  Warm and dry.  HEART:  Regular rate and rhythm with occasional PAC.  LUNGS:  Clear to auscultation and percussion.  ABDOMEN:  Soft.  EXTREMITIES:  Good strength, good gait, good pulses.  There is no edema.  There is no sign of DVT.  There is no effusions or arthritic changes.   I have had a long talk with Mr. Perlberg today.   I suspect her  palpitations are from PACs.  The aching in her legs could be Zocor, but  doubtful.  She really needs to lose some weight because much of this may  just be orthopedic.  She denies any back pain.   PLAN:  1. Hold Zocor for 3 weeks to see if this gets better.  If it does, she      will call back and we will place her on probably some Crestor at      lower dose.  2. Increase activity as much as possible.  3. Reassurance on the palpitations.  4. Weight loss.     Thomas C. Verl Blalock, MD, [facility]  Electronically Signed    TCW/MedQ  DD: 09/27/2008  DT: 09/27/2008  Job #: [id number]

## 2010-11-04 NOTE — Assessment & Plan Note (Signed)
[place] OFFICE NOTE   NAME:Maniaci, RIGLEY PREM                      MRN:          [id number]  DATE:05/29/2008                            DOB:          April 26, 1946    Ms. Elkind comes in today for followup.   She had an experience a couple of weeks ago where she began to have  numbness on her chin and around her mouth and her tongue, then into her  left shoulder and arm.  By the time she got to [facility], it  resolved.  It last by 45 minutes.  She had a CT scan and was told that  she did not have a stroke, but that these symptoms were similar to her  previous TIA.   Her blood pressures has been up and down.  She came in today for further  management of this problem.   I saw her on April 25, 2008, at that time, she was doing well.  I  encouraged her to lose weight.  I stopped her Imdur in the past as well.   She has had no chest pain or angina.  She has had no tachypalpitations.  No syncope or presyncope.   PHYSICAL EXAMINATION:  VITAL SIGNS:  Her blood pressure today is 150/70.  Her pulse is 72 and regular.  Her weight is 159.  HEENT:  Nonfocal and normal.  NECK:  Carotid upstrokes were equal bilaterally without bruits.  No JVD.  Thyroid is not enlarged.  Trachea is midline.  LUNGS:  Clear.  HEART:  Regular rate and rhythm.  No gallop.  ABDOMEN:  Soft.  EXTREMITIES:  No edema.  Pulses are intact.  NEUROLOGIC:  Intact.  Her gait is normal.   A 2-D echocardiogram on May 23, 2008, showed moderate left  ventricular hypertrophy with mild left atrial enlargement and diastolic  dysfunction.  She had carotid Dopplers on May 16, 2008, which  showed nonobstructive plaque in both internal carotid arteries with  normal flow in both vertebrals.   I reviewed her CT scan report and it did not show any acute  abnormalities in emergency department.   Blood pressures control is of absolute  paramount importance in Ms.  Kuck.  She really needs to drop her weight, start walking on a  regular basis and watch her sodium.  I am going to  increase her Diovan to 320 mg a day and her labetalol to 400 mg p.o.  b.i.d.  I will get her back in about 6 weeks for close followup.  I also  increased her aspirin to 325 a day.     Thomas C. Verl Blalock, MD, [facility]  Electronically Signed    TCW/MedQ  DD: 05/29/2008  DT: 05/30/2008  Job #: [id number]   cc:   Bonne Dolores, M.D.

## 2010-11-07 NOTE — Assessment & Plan Note (Signed)
[place] OFFICE NOTE   NAME:Figg, SHIZUKA EIKE                      MRN:          [id number]  DATE:10/26/2006                            DOB:          [birth year]    Ms. Fortes returns today for followup of her nonobstructive coronary  disease, except for 90% in a very small obtuse marginal II.  Please see  my previous note from August 12, 2006.   She is doing well with no angina.  She has really gotten serious about  her health, and her hemoglobin A1C has dropped from 8.6 to 6.8.  Her  weight has dropped about 5 pounds.  She is having a hard time losing  more weight.   The biggest problem has been restless legs and cramps at night.  She is  on Neurontin 300 b.i.d., which I have told her to increase.  Dr.  Caron Presume told her the same thing.   Otherwise, her medicines are unchanged.   PHYSICAL EXAMINATION:  VITAL SIGNS:  Her blood pressure today is 136/66,  her pulse 70 and regular.  Weight is 149.  SKIN:  Slightly pale.  HEENT:  Sclerae are clear.  Extraocular movements are intact.  PERRLA.  Facial symmetry is normal.  NECK:  Carotids are full without bruits.  There is no JVD.  Thyroid is  not enlarged.  Trachea is midline.  LUNGS:  Clear.  HEART:  Regular rate and rhythm.  ABDOMEN:  Soft with good bowel sounds.  There is no midline bruit.  EXTREMITIES:  No edema.  Pulses are intact.   Ms. Jardon is doing well.  I will reschedule her in November before  the holidays to reinforce a good, careful diet.  I will plan on seeing  her back at that time unless there are problems in the interim.     Thomas C. Verl Blalock, MD, [facility]  Electronically Signed    TCW/MedQ  DD: 10/26/2006  DT: 10/27/2006  Job #: [id number]   cc:   Bonne Dolores, M.D.

## 2010-11-07 NOTE — Assessment & Plan Note (Signed)
[place]                            CARDIOLOGY OFFICE NOTE   NAME:Virginia Huber, Virginia Huber                      MRN:          [id number]  DATE:07/13/2006                            DOB:          07-Mar-1946    I was asked by Dr. Bonne Dolores to evaluate Virginia Huber, a high-risk  patient for premature coronary disease or cerebrovascular disease.   HISTORY OF PRESENT ILLNESS:  She is 65 years of age, married white  female, wife of a patient of mine.  We had seen her in the past for  exertional chest pain.  Cardiac catheterization at that time was normal,  which was dated March 21, 1996.   She subsequently had what sounds like a TIA or migraine in 1997.  She  had similar symptoms of  TIA and had carotid Dopplers in March of 2003.  This showed intimal thickening, but no major plaque, and antegrade flow  in both vertebral.   Her risk factors include hypertension, type 2 diabetes 10+ years, and  hyperlipidemia.   She has exertional chest tightness and pressure and has had this for  years when she gets off her bike.  This is why she had the  catheterization in 1997.   She is having no symptoms of TIAs at present.   PAST MEDICAL HISTORY:  She has dye reaction history.  She is intolerant  of PENICILLIN.  She does not smoke, drink, or use any recreational  drugs.  She does try to exercise on a regular basis.  Other than at  Christmas, she has been very strict with her diet.  Unfortunately, had  blood work on June 24, 2006, which showed a hemoglobin A1C of 8.6%!  Her total cholesterol on Zocor 20 showed a total cholesterol of 157,  triglycerides of 202, HDL of 35, LDL of 82.  Her comprehensive metabolic  panel was normal, except for a blood sugar of 173, CBC was normal, and  her micro-albumin was high at 7.52 with the upper limits of normal being  1.89.   She has had previous hysterectomy in 1974, right ovariectomy in 1975,  left in 1976.  Colostomy  and reverse colostomy after her ovariectomy in  1976, and a cholecystectomy around 10 years ago.   FAMILY HISTORY:  Full of premature coronary disease, including her  mother and her brother.   CURRENT MEDICATIONS:  1. Labetalol 200 mg b.i.d.  2. Diovan 160 daily.  3. Aspirin 81 mg a day.  4. Levothyroxine 150 mcg a day.  5. Diazepam 5 mg nightly.  6. Zocor 20 mg nightly.  7. Metformin 1000 mg p.o. b.i.d.  8. Actos 15 mg a day.  9. Neurontin 300 mg b.i.d.  10.Lantus as directed p.r.n.   She was just taken off of Norvasc 5 mg when Dr. Caron Presume increased her  Diovan from 80 to 160.   SOCIAL HISTORY:  She is a Architect.  She is married and has 2  children.  She lives outside [place] on [address].  Her husband  came with her today.   REVIEW OF  SYSTEMS:  Other than the HPI, is really negative.  All systems  were reviewed.   EXAM:  Blood pressure is 189/84 today.  I checked it again with a large  cuff after she had been sitting stationary for 20 minutes and it was  180/ about 70.  Her pulse is 66 and regular.  She is 5 feet 3 inches and  weighs 150 pounds.  HEENT:  Normocephalic, atraumatic.  PERRLA.  Extraocular muscles are  intact.  Sclerae clear.  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  AV nicking, but no hemorrhages.  Her disks are flat.  Facial  symmetry is normal.  Dentition is satisfactory.  Carotid upstrokes are equal bilaterally without bruits.  There is no  JVD, thyroid is not enlarged, trachea is midline.  LUNGS:  Clear.  HEART:  Nondisplaced PMI.  She has a normal S1, S2 without murmur, rub,  or gallop.  ABDOMEN:  Soft with good bowel sounds.  There is no midline or flank  bruits.  EXTREMITIES:  No cyanosis, clubbing, or edema.  Pulses are brisk, 2+/4+,  both dorsalis pedis and posterior tibial.  NEURO:  Intact.  SKIN:  Intact.  MUSCULOSKELETAL:  Intact.   ELECTROCARDIOGRAM:  Essentially normal, except for some LVH with strain.   This is unchanged since 2003.   ASSESSMENT AND PLAN:  Virginia Huber is clearly a high-risk patient for  vascular disease, including coronary, cerebrovascular, and even  peripheral vascular.  Her blood pressure is clearly under poor control,  as was her hemoglobin A1C recently from the holidays.   I spent about 30 minutes talking to her and her husband about secondary  risk factor modification and the importance thereof.  I have added back  amlodipine at 5 mg a day, but it will probably need to be increased to  10 mg a day for a systolic blood pressure of less than 135.   She has had exertional angina for years.  She had a normal cath in 1997  for this, seemed to be the same symptoms.  However, I think a repeat  stress test is in order.  We have set her up for an adenosine Myoview.  If this shows any significant ischemia, she will need coronary  angiography.   If this is negative, I will see her back in a year.     Thomas C. Verl Blalock, MD, [facility]  Electronically Signed    TCW/MedQ  DD: 07/13/2006  DT: 07/13/2006  Job #: [id number]   cc:   Bonne Dolores, M.D.

## 2010-11-07 NOTE — Cardiovascular Report (Signed)
Virginia Huber               ACCOUNT NO.:  [id number]   MEDICAL RECORD NO.:  [id number]          PATIENT TYPE:  OIB   LOCATION:  1963                         FACILITY:  [facility]   PHYSICIAN:  Shaune Pascal. Bensimhon, MDDATE OF BIRTH:  June 18, 1946   DATE OF PROCEDURE:  07/29/2006  DATE OF DISCHARGE:                            CARDIAC CATHETERIZATION   PRIMARY CARE PHYSICIAN:  Bonne Dolores, M.D., [place], [place].   CARDIOLOGIST:  Marijo Conception. Wall, MD, [facility].   Virginia Huber is a 65 year old woman with multiple cardiac risk factors  including a long history of diabetes, hypertension and hyperlipidemia.  She has had a long history of chronic chest pain after riding her bike.  She had a cardiac catheterization in 1997 for this reason that showed  normal coronary arteries.  Recently she said the pain has gotten worse.  She saw Dr. Verl Blalock and got a Myoview stress test.  Reportedly this showed  some areas of ischemia with normal LV function.  She is referred for  diagnostic angiography in the outpatient catheterization lab.   PROCEDURES PERFORMED:  1. Selective coronary angiography.  2. Left heart cath.  3. Left ventriculogram.   DESCRIPTION OF PROCEDURE:  The patient reported possible contrast dye  reaction many years ago.  Due to this she was given 80 mg of Solu-Medrol  prior to the case.  The right groin area was prepped and draped in  routine sterile fashion.  A 4-French arterial sheath was placed in the  right femoral artery using a modified Seldinger technique.  Standard  catheters including JL-4, JR-4 and angled pigtail were used procedure.  All catheter exchanges made over a wire.  There were no apparent  complications.   Central aortic pressure was 101/57 with mean of 75.  LV pressure is  121/8 with an EDP of 18.  There was  no aortic stenosis.   Left main was normal.   LAD was a long vessel coursing to the apex.  It gave off three small  diagonals.  The LAD had  moderate diffuse narrowing throughout its entire  course about 30-40% consistent with diabetic vasculopathy.  There were  no high-grade focal lesions.   Left circumflex gave off a very tiny OM-1, a small OM-2, a large  branching OM-3.  There was a 40% proximal lesion in the AV groove  circumflex and a 40% lesion in the proximal to mid AV groove circumflex  spanning the takeoff of the OM-2.  In the ostium of the small OM-2 there  was a 90% focal lesion.   Right coronary artery was a large dominant vessel that gave off an RV  branch, moderate-sized PDA and two posterolaterals.  It was  angiographically normal.   Left ventriculogram done in RAO position showed an EF of about 55% with  normal wall motion abnormalities.  No significant mitral regurgitation.  On panning down over the abdominal aorta after the V-gram, there was no  evidence of aneurysmal dilatation.   ASSESSMENT:  1. Moderate coronary artery disease as described above.  2. There is a high-grade lesion of the ostium  of a small second obtuse      marginal.  3. Normal left ventricular function.   PLAN/DISCUSSION:  Virginia Huber does have a focal high-grade lesion in  the ostium of her OM-2, however, this is a small vessel and there is  some disease in the left circumflex at the takeoff of the vessel and  thus I think any attempts at intervening on this would compromise the  circulation to the much larger OM-3 branch.  Her OM-2 vessel __________  a very, very tiny amount of myocardium.  Thus will continue with medical  therapy.  We will add Imdur 30 mg a day.  If she continues to have chest  pain, we could consider cardiac rehab for chronic stable angina.      Shaune Pascal. Bensimhon, MD  Electronically Signed     DRB/MEDQ  D:  07/29/2006  T:  07/29/2006  Job:  [id number]   cc:   Bonne Dolores, M.D.  Thomas C. Wall, MD, [facility]

## 2010-11-07 NOTE — Op Note (Signed)
NAME:  Virginia Huber, Virginia Huber                         ACCOUNT NO.:  [id number]   MEDICAL RECORD NO.:  [id number]                   PATIENT TYPE:  AMB   LOCATION:  DAY                                  FACILITY:  [facility]   PHYSICIAN:  Hildred Laser, M.D.                 DATE OF BIRTH:  [birth year]   DATE OF PROCEDURE:  DATE OF DISCHARGE:                                 OPERATIVE REPORT   PROCEDURE:  Total colonoscopy.   INDICATION:  Virginia Huber is a 65 year old Caucasian female who is here for  screening colonoscopy.  Procedure is reviewed with the patient and informed  consent was obtained.  Family history is negative for CSE.   PREOPERATIVE MEDICATIONS:  Demerol 25 mg IV, Versed 4 mg IV.   FINDINGS:  Procedure performed in the endoscopy suite.  The patients vital  signs and O2 saturation were monitored during the procedure and remained  stable.  The patient was placed in the left lateral position.  Rectal  examination performed.  No abnormality noted on external or digital exam.  The Limbus videoscope was placed in the rectum and advanced into the sigmoid  colon.  Anastomosis was noted at 20-22 cm from the anal margin.  Please note  that she had part of her sigmoid colon taken out in 1980 for colonic injury  at laparoscopy.  The scope was passed into proximal sigmoid and beyond.  Preparation was satisfactory.  She has scattered few diverticula throughout  the colon.  Scope was advanced to the cecum which was identified by  ileocecal valve and appendiceal stump.  Pictures taken for the record.  As  the scope was withdrawn, colonic mucosa was once again carefully examined.  There was a tiny polyp at descending colon which was repaired by cold  biopsy.  Mucosa of the rest of the colon was normal.  Rectal mucosa  similarly was normal.  Scope was retroflexed and examined, which is  unremarkable.  Endoscope withdrawn.  The patient tolerated the procedure.   FINAL DIAGNOSES:  1. Pancolonic diverticulosis.   However, she has only a few scattered     diverticula toward the colon.  2. Small polyp repaired by cold biopsy from descending colon.  3. Wide open colonic anastomosis at 20 cm from the anal margin.   RECOMMENDATIONS:  1. High fiber diet.  2. Sugar-free Citrucel one tablespoonful daily.  3. I will be contacting the patient with biopsy results and further     recommendations.      ___________________________________________                                            Hildred Laser, M.D.   NR/MEDQ  D:  01/22/2004  T:  01/22/2004  Job:  [id number]   cc:   Mertie Clause.  Elonda Husky, M.D.  [address] [place]  [place] [address]  Fax: [phone]   Bonne Dolores, M.D.  [address] [place]  [place] [address]  Fax: [phone]

## 2010-11-07 NOTE — Procedures (Signed)
[facility]  Patient:    EDONA, RINEY Visit Number: [id number] MRN: [id number]          Service Type: MED Location: 2A A203 01 Attending Physician:  Prentiss Bells Dictated by:   Phillips Odor, M.D. Proc. Date: 09/21/01 Admit Date:  09/17/2001                            EEG Interpretations  BRIEF HISTORY:  This is a lady who presents with a history of confusion.  EEG REPORT:  This is a 16-channel recording for confusion for approximately 20 minutes.  There is posterior rhythm of 9 hertz which attenuates with eye opening. Beta activity is seen throughout the frontal leads.  There is occasional beta activity seen in the posterior rhythm of about 13 hertz bilaterally.  Awake and drowsy architecture is seen.  Photostimulation does not elicit any abnormal responses.  There is a rare left frontal central slowing seen.  No epileptiform activity is seen.  No lateralized slowing is seen.  IMPRESSION:  Somewhat abnormal recording due to a rare left frontotemporal slowing.  This could indicate focal cerebral disturbance and/or epileptic focus.  Clinical correlation is advised. Dictated by:   Phillips Odor, M.D. Attending Physician:  Prentiss Bells DD:  09/21/01 TD:  09/22/01 Job: [id number]

## 2010-11-07 NOTE — H&P (Signed)
[facility]  Patient:    Virginia Huber, Virginia Huber Visit Number: [id number] MRN: [id number]          Service Type: MED Location: 2A A203 01 Attending Physician:  Prentiss Bells Dictated by:   Purvis Kilts, M.D. Admit Date:  09/17/2001                           History and Physical  ADMISSION DIAGNOSIS:  Transient ischemic attack.  ADDITIONAL DIAGNOSES: 1. Diabetes. 2. Hypertension.  PRIMARY PHYSICIAN:  Dr. Caron Presume.  HISTORY OF PRESENT ILLNESS:  The patient is a 65 year old female who was in her usual state of health and working in her office when she began to not be able to put words together.  She was working on a newsletter and could not place words well.  She telephoned her daughter, who arrived and found her where she could not talk properly.  She was speaking words properly but could not put them together well.  She had no associated weakness whatsoever.  No associated visual changes.  She just felt some overall tingling around her perioral area.  She came in the emergency department, and this had resolved in the emergency department.  She was found to be quite hypertensive in the emergency department, and this was brought down by clonidine as well.  The symptoms resolved completely, and the patient felt quite well.  It was decided to admit her with heparinization after the CT scan was found to be negative for any hemorrhagic bleed.  PAST MEDICAL HISTORY: 1. Diabetes. 2. Hypertension. 3. Hypothyroidism. 4. Chronic back pain. 5. Chronic foot pain.  PAST SURGICAL HISTORY: 1. Total abdominal hysterectomy and oophorectomy at separate times. 2. History of colostomy, status post intestinal laceration.  ALLERGIES:  PENICILLIN.  MEDICATIONS: 1. Synthroid 150 mcg q.d. 2. Humalog 75/25, 30 units q.a.m. and 30 units q.p.m. 3. Altace 10 mg q.d. 4. Amaryl 10 mg q.d. 5. Os-Cal with vitamin D 600 b.i.d. 6. Oxycodone p.r.n.  SOCIAL HISTORY:   Works at [employer].  Does not drink or smoke. Lives with her husband.  FAMILY HISTORY:  History of CVAs, diabetes, and hypertension.  PHYSICAL EXAMINATION:  VITAL SIGNS:  Temperature 98.2, pulse 90, respirations 28, blood pressure 142/77.  GENERAL:  When I saw the patient she was a pleasant, talkative female in no acute distress.  HEENT:  Normocephalic, atraumatic.  Pupils are equal, round, and reactive to light.  Extraocular muscles intact.  Nasal and oropharynx clear, with moist mucous membranes.  NECK:  Supple.  No lymphadenopathy, no JVD, no carotid bruits.  LUNGS:  Clear to auscultation bilaterally.  No rhonchi, no rales, no wheezes.  CARDIOVASCULAR:  Regular rhythm.  Normal S1, S2.  No murmurs, gallops, or rubs.  ABDOMEN:  Bowel sounds positive.  Soft, nontender, nondistended.  No hepatosplenomegaly, no masses.  EXTREMITIES:  No cyanosis, clubbing, or edema.  Pulses +2 throughout.  NEUROLOGIC:  Cranial nerves II-XII intact.  Sensation is intact throughout. Strength is 5/5 throughout.  Reflexes +2 throughout.  LABORATORY DATA:  Head CT negative for any intracranial abnormalities per the emergency room physician.  No acute findings.  Sodium 134, potassium 3.8, chloride 99, bicarbonate 30, glucose 218, BUN 17, creatinine 0.9.  Liver functions normal.  White blood cell count 8.8, hematocrit 43.8, platelets 211.  ASSESSMENT:  The patient is a 65 year old female with diabetes, hypertension, question of hyperlipidemia, who presents with a transient ischemic attack.  PLAN: 1. Admit for heparinization. 2. Echocardiogram. 3. Carotid Dopplers. 4. MRI of the brain. 5. Check lipids. 6. Consult neurology. 7. Will continue to follow closely. Dictated by:   Purvis Kilts, M.D. Attending Physician:  Prentiss Bells DD:  09/18/01 TD:  09/18/01 Job: [id number]

## 2010-11-07 NOTE — Assessment & Plan Note (Signed)
[facility] OFFICE NOTE   NAME:Virginia Huber, Virginia Huber                      MRN:          [id number]  DATE:08/12/2006                            DOB:          11/29/1945    Ms. Bowens returns today after having outpatient cardiac  catheterization.  She has had exertional angina for years, and had a  positive stress Myoview which showed a very small area of anterior  septal ischemia.   Please see complete note from July 13, 2006.   She underwent cardiac catheterization by Dr. Haroldine Laws.  She was found  to have 30%-40% diffuse disease in the LAD, a 40% in an AV groove  circumflex, 90% in a very small OM2 osteum, and a normal right coronary  artery that was large.  She had an EF of 55% with no wall motion  abnormalities.   It was felt that this was possibly causing angina, and it could have  caused a defect on her Myoview.  However, it is not something that can  be intervened upon.  It also involves a very small area of myocardium.  We started her on isosorbide mononitrate 30 mg a day.  Her angina has  improved.   She has gotten really serious about her diet, and her hemoglobin A1c has  dropped over a percent since we saw her.  She is due blood work with Dr.  Caron Presume again in April.  She is trying to lose weight, but it is very  difficult.   Her meds are listed on the maintenance medication record.  There are the  same, except isosorbide has been added.   Her blood pressure today is 124/65, her pulse is 70 and regular, weight  is 150.  HEENT:  Normocephalic, atraumatic.  PERRLA.  Extraocular movements  intact.  Sclerae clear.  LUNGS:  Clear.  HEART:  Reveals a regular rate and rhythm.  Cath site is stable.  EXTREMITIES:  Reveal no edema.  Pulses are intact.   ASSESSMENT AND PLAN:  Ms. Rockholt seems to be getting serious about her  cardiovascular health.  I have spent about 20 minutes today reinforcing  the importance of this, and preventing future plaque progression or  plaque rupture.  Will plan on seeing her back again in 3 months for  further accountability and reinforcement.     Thomas C. Verl Blalock, MD, [facility]  Electronically Signed   TCW/MedQ  DD: 08/12/2006  DT: 08/12/2006  Job #: [id number]   cc:   Bonne Dolores, M.D.

## 2010-11-07 NOTE — Discharge Summary (Signed)
   NAMESHEYLY, BAYONA                           ACCOUNT NO.:  [id number]   MEDICAL RECORD NO.:  [id number]                  PATIENT TYPE:   LOCATION:                                       FACILITY:   PHYSICIAN:  Halford Chessman, M.D.               DATE OF BIRTH:   DATE OF ADMISSION:  09/17/2001  DATE OF DISCHARGE:  09/22/2001                                 DISCHARGE SUMMARY   DISCHARGE DIAGNOSIS:  Transient ischemic attack.   HISTORY OF PRESENT ILLNESS:  For the history of presenting illness and past  medical history please see admission H&P.   HOSPITAL COURSE:  A 65 year old female with diabetes, hypertension, and  hyperlipidemia presenting with TIA.  She was admitted for heparinization,  echocardiogram, carotid Dopplers, MRI of the brain, and neurology consult.  Neurology was consulted and made appropriate recommendations.  Please see  his note for details.   She felt well the day after admission.  No neurologic symptoms.  No  complaints.  The day after admission, heparin was discontinued.  Aspirin and  anti-lipid profile gone.  Echocardiogram and carotid Dopplers obtained.  EEG  and MRA.   On September 20, 2001, the patient again felt well with no neurological changes  or symptomatology.  [facility] also consulted and set up for an  event monitor.  Please see both cardiology and [facility] medical notes from  April 2 and April 3 for further details and discharge medications.   DISCHARGE PHYSICAL EXAMINATION:  See progress note on day of discharge.   DISCHARGE MEDICATIONS:  1. Aspirin 325 q.d.  2. Labetalol 200 b.i.d.  3. Pravachol 40 mg p.o. q.d.  4. Niaspan 500 q.h.s.  5. Synthroid 175 mcg q.d.  6. Lotrel 10/20 q.d.  7. Os-Cal with vitamin D q.d.  8. Amaryl 2 mg q.d.    LABORATORY DATA:  Renal artery MRA set up as an outpatient.  Carotid  Dopplers showed bilateral intimal  thickening without plaque formation.  MRI  of the brain showed minimal small vessel  disease, otherwise normal.   FOLLOW UP:  Follow up as scheduled with neurology, cardiology, and [facility].                                                Halford Chessman, M.D.    Katheren Shams  D:  02/09/2002  T:  02/09/2002  Job:  [id number]

## 2010-11-07 NOTE — Consult Note (Signed)
[facility]  Patient:    Virginia Huber, GANNAWAY Visit Number: [id number] MRN: [id number]          Service Type: MED Location: 2A A203 01 Attending Physician:  Prentiss Bells Dictated by:   Phillips Odor, M.D. Admit Date:  09/17/2001                            Consultation Report  IMPRESSION:  Left hemispheric small cortical infarct versus small subcortical infarct on the left.  RECOMMENDATIONS: 1. Aspirin. 2. Discontinue heparin as this has not been proven to be more effective than    aspirin. 3. Fasting lipid profile and homocystine level. 4. Echocardiogram and also obtain a carotid doppler. 5. Over-the-head MRA of the brain. 6. EEG.  HISTORY OF PRESENT ILLNESS:  This 65 year old right-handed Caucasian lady, who has a history of hypertension and diabetes, developed an acute onset of talking difficulty.  She apparently was working on the computer and had a problem with expression of things that she wanted to write down.  She particularly had problems with numbers.  She then called her daughter and asked for help.  Her daughter apparently reported coming to the office an hour later and the patient was working on the very same numbers when she arrived. It was noted that she had problems essentially saying what she wanted to say and expressing what she wanted to say.  She again had problems specifically with numbers.  She was confused.  She had nonsensical language and speech. The patient knew what she wanted to say but just had a hard time with expressing it.  She also reported some numbness in the left and the right leg and right arm. There is also right periorbital numbness.  No double vision.  No dizziness. No light-headedness.  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  The symptoms initially started mild but got worse and lasted for approximately three hours.  After that, the symptoms  improved significantly; however, the family reports that she has not been completely back to baseline.  She still has some problems with apparent paraphasic areas.  PAST MEDICAL HISTORY:  She has had diabetes for about 10 years.  She has also been treated for hypertension.  There is a history of hypothyroidism.  She has recently had fracture of the right ankle and has a procedure for this.  She apparently has had some problems with the right leg on account of this.  MEDICATIONS:  Insulin, Altace, Synthroid, oxycodone, Amaryl.  SOCIAL HISTORY:  She is married and has a supportive family.  No reports of habits.  REVIEW OF SYSTEMS:  As stated above.  Apparently was not taking any antiplatelet agent prior to this event.  Apparently, she did have her blood pressure checked by her husband before coming in and it was 160/80.  It has been higher, particularly in the emergency room, of 210/110.  Apparently, her blood sugars were checked and they were fine.  She denies any headaches.  PHYSICAL EXAMINATION:  GENERAL:  Physical examination shows a pleasant lady.  She is in no acute distress.  VITAL SIGNS:  Blood pressure 170/80.  She is afebrile.  Respirations 20. Pulse in the 90s to low 100s.  NEUROLOGICAL:  She is awake and alert.  Speech is normal.  Language was tested at the bedside but she did relatively well.  Fluency was intact.  Naming was unremarkable including more complicated naming which did not reveal any impairment at bedside.  Comprehension was intact.  Repetition was also intact. General cognition was unremarkable.  She was oriented x 3.  Cranial nerve report that pupils were 6 mm and briskly reactive.  Extraocular movements were intact.  Visual fields in the right eye were also intact.  Face muscle strength was unremarkable.  There was some weakness of the orbicularis oculi muscles bilaterally.  Facial muscle strength otherwise was unremarkable. Tongue and uvula were  midline.  Shoulder shrugs are normal.  Motor examination shows normal tone, bulk, and strength in both upper and lower extremities both proximally and distally.  Reflexes were +1.  Toes were downgoing.  Sensory examination normal to temperature, light touch, and bilateral simultaneous stimulation.  Coordination is intact.  Gait is unremarkable.  LABORATORY DATA:  CT scan of the brain shows no acute findings.  Glucose and Accu-Cheks in the low 200s.  Sodium 134, potassium 3.8, calcium 9.3.  WBC 8, hemoglobin 15, platelets gone up to 221,000. Dictated by:   Phillips Odor, M.D. Attending Physician:  Prentiss Bells DD:  09/19/01 TD:  09/19/01 Job: [id number]

## 2010-11-24 ENCOUNTER — Ambulatory Visit (INDEPENDENT_AMBULATORY_CARE_PROVIDER_SITE_OTHER): Payer: Medicare Other | Admitting: Gastroenterology

## 2010-11-24 ENCOUNTER — Encounter: Payer: Self-pay | Admitting: Gastroenterology

## 2010-11-24 VITALS — BP 168/71 | HR 64 | Temp 97.4°F | Ht 63.0 in | Wt 136.2 lb

## 2010-11-24 DIAGNOSIS — R1031 Right lower quadrant pain: Secondary | ICD-10-CM

## 2010-11-24 HISTORY — DX: Right lower quadrant pain: R10.31

## 2010-11-24 NOTE — Progress Notes (Signed)
Primary Care Physician:  Purvis Kilts, MD Primary Gastroenterologist:  Dr. Oneida Alar  Chief Complaint  Patient presents with  . Abdominal Pain    HPI:  Virginia Huber is a 65 y.o. female here as a new patient for evaluation of abdominal pain. Complains of RLQ pain for a few months, extending down to groin. Feels like it did when had tumor on ovary. An ache. Intermittent. Now has been hurting for a few weeks, constant. Nothing relieves it. Movement/exertion worsens discomfort. Will have BM 3-4X per day. Sometimes formed, usually soft. No blood in stool. No fever/chills. No lack of appetite. Has lost 30 lbs ON PURPOSE due to diagnosis of diabetes. Really watching diet. Has had nausea since starting Byetta. Last colonoscopy in 2005, pancolonic diverticula, polyp with path unknown at this time. Requesting reports.   Past Medical History  Diagnosis Date  . HTN (hypertension)   . DM (diabetes mellitus)   . Heart block   . Hypothyroidism   . Ovarian tumor   . Lupus   . S/P colonoscopy 2005    Dr. Laural Golden: pancolonic divericula, polyp, path unknown currently    Past Surgical History  Procedure Date  . Cholecystectomy   . Colostomy     then reversed  . Partial hysterectomy     left ovaries, then ovaries removed later due tumors   . Oophorectomy     1980, nicked bowel, peritonitis, colostomy, now colostomy reversed   . Eye surgeries     Current Outpatient Prescriptions  Medication Sig Dispense Refill  . amLODipine (NORVASC) 5 MG tablet Take 10 mg by mouth daily.       [name] aspirin 325 MG EC tablet Take 325 mg by mouth daily.        . Cholecalciferol (VITAMIN D) 2000 UNITS tablet Take 2,000 Units by mouth daily.        [name] exenatide (BYETTA 5 MCG PEN) 5 MCG/0.02ML SOLN Inject into the skin 2 (two) times daily with a meal.        . insulin glargine (LANTUS) 100 UNIT/ML injection Inject 30 Units into the skin at bedtime.       [name] labetalol (NORMODYNE) 200 MG tablet Take 200 mg by mouth 2  (two) times daily.        [name] levothyroxine (SYNTHROID, LEVOTHROID) 125 MCG tablet Take 125 mcg by mouth daily.        [name] losartan (COZAAR) 100 MG tablet Take 100 mg by mouth daily.        . metFORMIN (GLUCOPHAGE) 500 MG tablet Take 500 mg by mouth 3 (three) times daily.       . pioglitazone (ACTOS) 15 MG tablet Take 15 mg by mouth daily.        . pravastatin (PRAVACHOL) 80 MG tablet Take 80 mg by mouth daily.                Allergies as of 11/24/2010 - Review Complete 11/24/2010  Allergen Reaction Noted  . Penicillins      Family History  Problem Relation Age of Onset  . Heart disease Mother     deceased  . Heart disease Father     deceased, heart disease  . Colon cancer Neg Hx     History   Social History  . Marital Status: Married    Spouse Name: N/A    Number of Children: N/A  . Years of Education: N/A   Occupational History  . Retired  insurance billing   Social History Main Topics  . Smoking status: Never Smoker   . Smokeless tobacco: Not on file  . Alcohol Use: No  . Drug Use: No  . Sexually Active: Not on file      Review of Systems: Gen: Denies any fever, chills, sweats, anorexia, fatigue, weakness, malaise, weight loss, and sleep disorder CV: Denies chest pain, angina, palpitations, syncope, orthopnea, PND, peripheral edema, and claudication. Resp: Denies dyspnea at rest, dyspnea with exercise, cough, sputum, wheezing, coughing up blood, and pleurisy. GI: Denies vomiting blood, jaundice, and fecal incontinence.   Denies dysphagia or odynophagia. GU : Denies urinary burning, blood in urine, urinary frequency, urinary hesitancy, nocturnal urination, and urinary incontinence. MS: Denies joint pain, limitation of movement, and swelling, stiffness, low back pain, extremity pain. Denies muscle weakness, cramps, atrophy.  Derm: Denies rash, itching, dry skin, hives, moles, warts, or unhealing ulcers.  Psych: Denies depression, anxiety, memory loss, suicidal  ideation, hallucinations, paranoia, and confusion. Heme: Denies bruising, bleeding, and enlarged lymph nodes.  Physical Exam: BP 168/71  Pulse 64  Temp(Src) 97.4 F (36.3 C) (Temporal)  Ht 5\' 3"  (1.6 m)  Wt 136 lb 3.2 oz (61.78 kg)  BMI 24.13 kg/m2 General:   Alert,  Well-developed, well-nourished, pleasant and cooperative in NAD Head:  Normocephalic and atraumatic. Eyes:  Sclera clear, no icterus.   Conjunctiva pink. Ears:  Normal auditory acuity. Nose:  No deformity, discharge,  or lesions. Mouth:  No deformity or lesions, dentition normal. Neck:  Supple; no masses or thyromegaly. Lungs:  Clear throughout to auscultation.   No wheezes, crackles, or rhonchi. No acute distress. Heart:  Regular rate and rhythm; no murmurs, clicks, rubs,  or gallops. Abdomen:  Soft, nontender and nondistended. No masses, hepatosplenomegaly or hernias noted. Normal bowel sounds, without guarding, and without rebound.   Rectal:  Deferred until time of colonoscopy.   Msk:  Symmetrical without gross deformities. Normal posture. Extremities:  Without clubbing or edema. Neurologic:  Alert and  oriented x4;  grossly normal neurologically. Skin:  Intact without significant lesions or rashes. Cervical Nodes:  No significant cervical adenopathy. Psych:  Alert and cooperative. Normal mood and affect.

## 2010-11-24 NOTE — Assessment & Plan Note (Addendum)
65 year old female with several month hx of RLQ pain, achy, intermittent in past, now constant for past several weeks. Not relieved by anything. Exacerbated by movement. No change in bowel habits. Has BM 3-4X per day. No melena or brbpr. Afebrile, no chills. No lack of appetite; does have purposeful wt loss secondary to diagnosis of diabetes. Has had multiple abdominal surgeries in past: see PMH/PSH. 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 Unknown path report at this time; requesting records.  Likely needs updated colonoscopy, but we will proceed with CT abd/pelvis to assess for other etiology prior to invasive procedure. Differentials include adhesive disease, less likely diverticular origin, functional abdominal pain.   Proceed with CT abd/pelvis Creatinine prior; hold metformin for 48 hours after scan Obtain path from polyp in 2005 Likely needs updated colonoscopy; will review CT first The R/B/A have been discussed in detail with pt regarding a colonoscopy; she states understanding and is willing to proceed if necessary.   ADDENDUM: ifobt +, CBC nl. Pt scheduled for 6/15 colonoscopy

## 2010-11-24 NOTE — Patient Instructions (Signed)
You have been set up for a CT scan. Please have labs drawn prior to this scan to check your kidney function.  Do NOT take metformin the morning of the CT scan. Do not take again for 48 hours after scan. Then, you may resume at your normal dosing.  We have requested the reports from your last colonoscopy. We will schedule a colonoscopy most likely after your CT scan is reviewed.  We will be calling you with the results when available.

## 2010-11-25 ENCOUNTER — Ambulatory Visit (HOSPITAL_COMMUNITY)
Admission: RE | Admit: 2010-11-25 | Discharge: 2010-11-25 | Disposition: A | Discharge status: Home / Self Care | Payer: Medicare Other | Source: Ambulatory Visit | Attending: Gastroenterology | Admitting: Gastroenterology

## 2010-11-25 ENCOUNTER — Other Ambulatory Visit: Payer: Self-pay | Admitting: Gastroenterology

## 2010-11-25 ENCOUNTER — Ambulatory Visit (INDEPENDENT_AMBULATORY_CARE_PROVIDER_SITE_OTHER): Payer: Medicare Other | Admitting: Cardiology

## 2010-11-25 ENCOUNTER — Encounter: Payer: Self-pay | Admitting: Cardiology

## 2010-11-25 DIAGNOSIS — R1031 Right lower quadrant pain: Secondary | ICD-10-CM | POA: Insufficient documentation

## 2010-11-25 DIAGNOSIS — Z9049 Acquired absence of other specified parts of digestive tract: Secondary | ICD-10-CM | POA: Insufficient documentation

## 2010-11-25 DIAGNOSIS — K869 Disease of pancreas, unspecified: Secondary | ICD-10-CM | POA: Insufficient documentation

## 2010-11-25 DIAGNOSIS — E663 Overweight: Secondary | ICD-10-CM

## 2010-11-25 DIAGNOSIS — I2510 Atherosclerotic heart disease of native coronary artery without angina pectoris: Secondary | ICD-10-CM

## 2010-11-25 MED ORDER — PRAVASTATIN SODIUM 80 MG PO TABS: 80.0000 mg | ORAL_TABLET | Freq: Every day | ORAL | Status: DC

## 2010-11-25 MED ORDER — IOHEXOL 300 MG/ML  SOLN
100.0000 mL | Freq: Once | INTRAMUSCULAR | Status: AC | PRN
  Administered 2010-11-25: 100 mL via INTRAVENOUS

## 2010-11-25 NOTE — Progress Notes (Signed)
Cc to PCP 

## 2010-11-25 NOTE — Patient Instructions (Signed)
**Note De-identified  Obfuscation** Your physician recommends that you continue on your current medications as directed. Please refer to the Current Medication list given to you today.  Your physician recommends that you schedule a follow-up appointment in: 6 months  

## 2010-11-25 NOTE — Assessment & Plan Note (Signed)
Stable, no change in treatment.

## 2010-11-25 NOTE — Progress Notes (Signed)
HPI Virginia Huber comes in today for followup and management of her coronary disease. She continues to lose weight and is now down to 136. She feels remarkably better. She's having no angina or palpitations. Blood work is being followed by a [facility] geologist.  She's very compliant with her medications. Blood work is being followed by primary care. There's been having some abdominal pain is having a CT scan done this afternoon through gastroenterology.  Past Medical History  Diagnosis Date  . HTN (hypertension)   . DM (diabetes mellitus)   . Heart block   . Hypothyroidism   . Ovarian tumor   . Lupus   . S/P colonoscopy 2005    Dr. Laural Golden: pancolonic divericula, polyp, path unknown currently    Past Surgical History  Procedure Date  . Cholecystectomy   . Colostomy     then reversed  . Partial hysterectomy     left ovaries, then ovaries removed later due tumors   . Oophorectomy     1980, nicked bowel, peritonitis, colostomy, now colostomy reversed   . Eye surgeries     Family History  Problem Relation Age of Onset  . Heart disease Mother     deceased  . Heart disease Father     deceased, heart disease  . Colon cancer Neg Hx     History   Social History  . Marital Status: Married    Spouse Name: N/A    Number of Children: N/A  . Years of Education: N/A   Occupational History  . Retired     Research officer, political party   Social History Main Topics  . Smoking status: Never Smoker   . Smokeless tobacco: Not on file  . Alcohol Use: No  . Drug Use: No  . Sexually Active: Not on file   Other Topics Concern  . Not on file   Social History Narrative  . No narrative on file    Allergies  Allergen Reactions  . Penicillins     Current Outpatient Prescriptions  Medication Sig Dispense Refill  . amLODipine (NORVASC) 5 MG tablet Take 10 mg by mouth daily.       [name] aspirin 325 MG EC tablet Take 325 mg by mouth daily.        . Cholecalciferol (VITAMIN D) 2000 UNITS tablet Take  2,000 Units by mouth daily.        [name] exenatide (BYETTA 5 MCG PEN) 5 MCG/0.02ML SOLN Inject into the skin 2 (two) times daily with a meal.        . insulin glargine (LANTUS) 100 UNIT/ML injection Inject 30 Units into the skin at bedtime.       [name] labetalol (NORMODYNE) 200 MG tablet Take 200 mg by mouth 2 (two) times daily.        [name] levothyroxine (SYNTHROID, LEVOTHROID) 125 MCG tablet Take 125 mcg by mouth daily.        [name] losartan (COZAAR) 100 MG tablet Take 100 mg by mouth daily.        . metFORMIN (GLUCOPHAGE) 500 MG tablet Take 500 mg by mouth 3 (three) times daily.       . pravastatin (PRAVACHOL) 80 MG tablet Take 80 mg by mouth daily.        [name] DISCONTD: pioglitazone (ACTOS) 15 MG tablet Take 15 mg by mouth daily.          ROS Negative other than HPI.   PE General Appearance: well developed, well nourished in no acute distress HEENT:  symmetrical face, PERRLA, good dentition  Neck: no JVD, thyromegaly, or adenopathy, trachea midline Chest: symmetric without deformity Cardiac: PMI non-displaced, RRR, normal S1, S2, no gallop or murmur Lung: clear to ausculation and percussion Vascular: all pulses full without bruits  Abdominal: nondistended, nontender, good bowel sounds, no HSM, no bruits Extremities: no cyanosis, clubbing or edema, no sign of DVT, no varicosities  Skin: normal color, no rashes Neuro: alert and oriented x 3, non-focal Pysch: normal affect Filed Vitals:   11/25/10 1414  BP: 131/63  Pulse: 64  Height: 5\' 3"  (1.6 m)  Weight: 136 lb (61.689 kg)    EKG  Labs and Studies Reviewed.   Lab Results  Component Value Date   WBC 6.2 07/27/2006   HGB 12.4 07/27/2006   HCT 37.3 07/27/2006   MCV 91.5 07/27/2006   PLT 290 07/27/2006      Chemistry      Component Value Date/Time   NA 144 07/27/2006 0735   K 4.0 07/27/2006 0735   CL 113* 07/27/2006 0735   CO2 29 07/27/2006 0735   BUN 19 07/27/2006 0735   CREATININE 0.72 11/24/2010 1013   CREATININE 0.9 07/27/2006 0735      Component  Value Date/Time   CALCIUM 9.5 07/27/2006 0735   ALKPHOS 63 02/13/2010 1744   AST 10 02/13/2010 1744   ALT 12 02/13/2010 1744   BILITOT 0.4 02/13/2010 1744       Lab Results  Component Value Date   CHOL 165 02/13/2010   Lab Results  Component Value Date   HDL 32* 02/13/2010   Lab Results  Component Value Date   LDLCALC 80 02/13/2010   Lab Results  Component Value Date   TRIG 264* 02/13/2010   Lab Results  Component Value Date   CHOLHDL 5.2 Ratio 02/13/2010   No results found for this basename: HGBA1C   Lab Results  Component Value Date   ALT 12 02/13/2010   AST 10 02/13/2010   ALKPHOS 63 02/13/2010   BILITOT 0.4 02/13/2010   No results found for this basename: TSH

## 2010-11-28 ENCOUNTER — Other Ambulatory Visit: Payer: Self-pay

## 2010-11-29 ENCOUNTER — Other Ambulatory Visit: Payer: Self-pay | Admitting: Gastroenterology

## 2010-11-30 LAB — CBC WITH DIFFERENTIAL/PLATELET
Basophils Absolute: 0.1 10*3/uL (ref 0.0–0.1)
Basophils Relative: 1 % (ref 0–1)
Eosinophils Absolute: 0.2 10*3/uL (ref 0.0–0.7)
Eosinophils Relative: 2 % (ref 0–5)
MCH: 29.9 pg (ref 26.0–34.0)
MCHC: 32.9 g/dL (ref 30.0–36.0)
MCV: 90.9 fL (ref 78.0–100.0)
Neutrophils Relative %: 73 % (ref 43–77)
Platelets: 259 10*3/uL (ref 150–400)
RDW: 14 % (ref 11.5–15.5)

## 2010-12-01 ENCOUNTER — Ambulatory Visit: Payer: Medicare Other | Admitting: Gastroenterology

## 2010-12-01 DIAGNOSIS — R109 Unspecified abdominal pain: Secondary | ICD-10-CM

## 2010-12-03 ENCOUNTER — Telehealth: Payer: Self-pay

## 2010-12-03 NOTE — Telephone Encounter (Signed)
TCS 6/15

## 2010-12-03 NOTE — Telephone Encounter (Signed)
Crystal:  Diabetes meds instructions: Hold Byetta evening prior 1/2 dose metformin evening prior, none morning of If takes Actos in evening, take 1/2 dose of that. None in morning.   Thanks.

## 2010-12-03 NOTE — Telephone Encounter (Signed)
Pt left VM that she would like a call from [place] as soon as possible. Said she would like to know results of labs/ iFOBT. Also, she had a rough night last night with her side pain and diarrhea. She wants to discuss with Vicente Males.

## 2010-12-03 NOTE — Telephone Encounter (Addendum)
CBC looked good. No anemia or elevated white count. ifobt was positive. Her CT, as reviewed earlier, showed no etiology to pain.  Her last colonoscopy was in 2005 with Dr. Laural Golden; a polyp was removed. For some reason, medical records can't find this. Due to change in bowel habits, let's set her up for a colonoscopy.  Also, find out if her pain goes along with diarrhea. Is she having constant diarrhea, or does it come and go?   Needs to be eating high fiber diet. Avoid dairy products for now. Proceed with colonoscopy.

## 2010-12-03 NOTE — Telephone Encounter (Signed)
Schedule pt June 15

## 2010-12-03 NOTE — Telephone Encounter (Signed)
Called and informed pt. She said her right side aches each morning when she gets up after she has been lying at night. The diarrhea is very much constant. She is trying to do high fiber diet, and does not have dairy products very often, occasionally a bowl of cereal or some sugar free ice cream. She is diabetic and would like to have as early an appt for TCS as possible. (PLEASE GIVE INSTRUCTIONS FOR DIABETIC MEDS FOR COLONOSCOPY PREP AND FORWARD TO [facility])

## 2010-12-04 NOTE — Telephone Encounter (Signed)
The earliest time on 12/05/10 will be 12:30. Please advise if pt can have clear liquids the morning of procedure and also does she need to still wait to take her diabetic meds that morning.

## 2010-12-04 NOTE — Progress Notes (Signed)
CT A/P NAIAP. Agree with iFOBT and CBC. No TCS unless heme pos or pt has anemia. OPV in 4-6 weeks w/ SLF E:30 visit.

## 2010-12-04 NOTE — Telephone Encounter (Signed)
Pt is scheduled for TCS on 06/15. Instructions went over in the office

## 2010-12-04 NOTE — Telephone Encounter (Signed)
Pt may have clear liquid breakfast. She may continue Metformin dose. Follow prior instructions for diabetes meds from [place].

## 2010-12-05 ENCOUNTER — Encounter: Payer: Medicare Other | Admitting: Gastroenterology

## 2010-12-05 ENCOUNTER — Other Ambulatory Visit: Payer: Self-pay | Admitting: Gastroenterology

## 2010-12-05 ENCOUNTER — Ambulatory Visit (HOSPITAL_COMMUNITY)
Admission: RE | Admit: 2010-12-05 | Discharge: 2010-12-05 | Disposition: A | Discharge status: Home / Self Care | Payer: Medicare Other | Source: Ambulatory Visit | Attending: Gastroenterology | Admitting: Gastroenterology

## 2010-12-05 DIAGNOSIS — E119 Type 2 diabetes mellitus without complications: Secondary | ICD-10-CM | POA: Insufficient documentation

## 2010-12-05 DIAGNOSIS — E785 Hyperlipidemia, unspecified: Secondary | ICD-10-CM | POA: Insufficient documentation

## 2010-12-05 DIAGNOSIS — K648 Other hemorrhoids: Secondary | ICD-10-CM | POA: Insufficient documentation

## 2010-12-05 DIAGNOSIS — Z794 Long term (current) use of insulin: Secondary | ICD-10-CM | POA: Insufficient documentation

## 2010-12-05 DIAGNOSIS — K5730 Diverticulosis of large intestine without perforation or abscess without bleeding: Secondary | ICD-10-CM

## 2010-12-05 DIAGNOSIS — Z7982 Long term (current) use of aspirin: Secondary | ICD-10-CM | POA: Insufficient documentation

## 2010-12-05 DIAGNOSIS — R1031 Right lower quadrant pain: Secondary | ICD-10-CM | POA: Insufficient documentation

## 2010-12-05 DIAGNOSIS — Z79899 Other long term (current) drug therapy: Secondary | ICD-10-CM | POA: Insufficient documentation

## 2010-12-05 DIAGNOSIS — I10 Essential (primary) hypertension: Secondary | ICD-10-CM | POA: Insufficient documentation

## 2010-12-05 DIAGNOSIS — R634 Abnormal weight loss: Secondary | ICD-10-CM | POA: Insufficient documentation

## 2010-12-11 NOTE — Progress Notes (Signed)
Cc path to PCP

## 2010-12-17 NOTE — Op Note (Signed)
Virginia Huber, Virginia Huber               ACCOUNT NO.:  [id number]  MEDICAL RECORD NO.:  [id number]  LOCATION:  [facility]                          FACILITY:  [facility]  PHYSICIAN:  Barney Drain, M.D.     DATE OF BIRTH:  [birth year]  DATE OF PROCEDURE:  12/05/2010 DATE OF DISCHARGE:                               PROCEDURE NOTE   REFERRING PHYSICIAN:  Halford Chessman, MD  PROCEDURE:  Ileocolonoscopy with random cold forceps biopsy of the colonic mucosa.  INDICATION FOR EXAM:  [name] is a 65 year old female who has had loose stools since 2000, when she had a gallbladder taken out.  She was seen and evaluated by Dr. Laural Golden in 2005, for screening colonoscopy. She reports that she has had an achy right lower quadrant pain over the last 2 months.  She was doing exercise via senior aerobics 3 times a day.  She has had an intentional 40-pound weight loss.  She had nausea after starting Byetta.  On a bad day, she is 7-8 stools, on a good day, she has 2-3.  The pain is not improved after bowel movements and passed gas.  She tried ibuprofen for the pain and it helped.  She does not remember pulling the muscle.  Rarely that she have indigestion.  She denies any dysuria or hematuria.  Dr. Laural Golden, mentioned that she might have irritable bowel in 2005.  FINDINGS: 1. Normal terminal ileum approximately 20-30 cm visualized. 2. Scattered diverticula throughout the entire colon.  Otherwise no     polyps, masses, inflammatory changes, or AVMs seen.  Random     biopsies obtained via cold forceps to evaluate for microscopic     colitis as an etiology for chronic loose stool. 3. Small internal hemorrhoids.  Otherwise normal retroflex view of the     rectum.  DIAGNOSES: 1. No obvious source for loose stools and abdominal pain identified.     She most likely has musculoskeletal etiology for her chronic     abdominal pain.  The differential diagnosis for her chronic loose     stool includes lactose intolerance,  bile salt-induced diarrhea,     irritable bowel syndrome, and less likely small bowel bacterial     overgrowth, or giardiasis.  RECOMMENDATIONS: 1. She should TAkE Prilosec daily while taking ibuprofen.  Recommend     ibuprofen 400 mg 2 by mouth 3 times a day for 10 days.  She can use     Tylenol as needed for pain. 2. Screening colonoscopy in 5-10 years.  Will call with the results     of her biopsies. 3. She should follow a high-fiber lactose-free diet.  She has given     handout on high-fiber diet, lactose-free diet, diverticulosis, and     hemorrhoids. 4. Follow up in 6 weeks with Dr. Oneida Alar, regarding her diarrhea.  MEDICATIONS: 1. Demerol 50 mg IV. 2. Versed 6 mg IV.  PROCEDURE TECHNIQUE:  Physical exam was performed.  Informed consent was obtained from the patient after explaining benefits, risks, and alternatives to the procedure.  The patient was connected to monitor and placed in left lateral position.  Continuous oxygen was provided by nasal cannula  and IV medicine was administered through an indwelling cannula.  After administration of sedation and rectal exam, the patient's rectum was intubated and the scope advanced under direct visualization to the distal terminal ileum.  The scope was removed slowly by carefully examining the color, texture, anatomy, and integrity of the mucosa on the way out.  The patient was recovered in endoscopy and discharged home in satisfactory condition.  PATH: NL COLON Bx   Barney Drain, M.D.     SF/MEDQ  D:  12/05/2010  T:  12/06/2010  Job:  [id number]  cc:   Halford Chessman, M.D. [phone]  Electronically Signed by Barney Drain M.D. on 12/17/2010 02:56:48 PM

## 2010-12-25 ENCOUNTER — Telehealth: Payer: Self-pay | Admitting: Cardiology

## 2010-12-25 MED ORDER — LABETALOL HCL 200 MG PO TABS: 200.0000 mg | ORAL_TABLET | Freq: Two times a day (BID) | ORAL | Status: DC

## 2010-12-25 NOTE — Telephone Encounter (Signed)
.   Requested Prescriptions   Signed Prescriptions Disp Refills  . labetalol (NORMODYNE) 200 MG tablet 60 tablet 11    Sig: Take 1 tablet (200 mg total) by mouth 2 (two) times daily.    Authorizing Provider: Jenell Milliner    Ordering User: Laurence Compton

## 2011-01-14 ENCOUNTER — Ambulatory Visit: Payer: Medicare Other | Admitting: Gastroenterology

## 2011-01-19 ENCOUNTER — Other Ambulatory Visit: Payer: Self-pay

## 2011-01-19 MED ORDER — AMLODIPINE BESYLATE 10 MG PO TABS: 10.0000 mg | ORAL_TABLET | Freq: Every day | ORAL | Status: DC

## 2011-01-21 ENCOUNTER — Other Ambulatory Visit: Payer: Self-pay

## 2011-01-21 MED ORDER — OMEPRAZOLE 20 MG PO CPDR: 20.0000 mg | DELAYED_RELEASE_CAPSULE | Freq: Every day | ORAL | Status: DC

## 2011-03-24 LAB — BASIC METABOLIC PANEL
BUN: 18
CO2: 26
Chloride: 109
Creatinine, Ser: 0.77
GFR calc Af Amer: >60
Glucose, Bld: 139 — ABNORMAL HIGH

## 2011-03-24 LAB — CBC
MCHC: 33.5
MCV: 90.9
RBC: 4.38
RDW: 13.5

## 2011-04-07 ENCOUNTER — Other Ambulatory Visit (HOSPITAL_COMMUNITY): Payer: Self-pay | Admitting: Endocrinology

## 2011-04-07 DIAGNOSIS — Z780 Asymptomatic menopausal state: Secondary | ICD-10-CM

## 2011-04-08 ENCOUNTER — Other Ambulatory Visit: Payer: Self-pay | Admitting: Obstetrics and Gynecology

## 2011-04-08 DIAGNOSIS — Z139 Encounter for screening, unspecified: Secondary | ICD-10-CM

## 2011-04-14 ENCOUNTER — Ambulatory Visit (HOSPITAL_COMMUNITY)
Admission: RE | Admit: 2011-04-14 | Discharge: 2011-04-14 | Disposition: A | Discharge status: Home / Self Care | Payer: Medicare Other | Source: Ambulatory Visit | Attending: Endocrinology | Admitting: Endocrinology

## 2011-04-14 ENCOUNTER — Ambulatory Visit (HOSPITAL_COMMUNITY)
Admission: RE | Admit: 2011-04-14 | Discharge: 2011-04-14 | Disposition: A | Discharge status: Home / Self Care | Payer: Medicare Other | Source: Ambulatory Visit | Attending: Obstetrics and Gynecology | Admitting: Obstetrics and Gynecology

## 2011-04-14 DIAGNOSIS — Z1231 Encounter for screening mammogram for malignant neoplasm of breast: Secondary | ICD-10-CM | POA: Insufficient documentation

## 2011-04-14 DIAGNOSIS — Z780 Asymptomatic menopausal state: Secondary | ICD-10-CM | POA: Insufficient documentation

## 2011-04-14 DIAGNOSIS — Z139 Encounter for screening, unspecified: Secondary | ICD-10-CM

## 2011-04-14 DIAGNOSIS — M949 Disorder of cartilage, unspecified: Secondary | ICD-10-CM | POA: Insufficient documentation

## 2011-04-14 DIAGNOSIS — M899 Disorder of bone, unspecified: Secondary | ICD-10-CM | POA: Insufficient documentation

## 2011-05-05 ENCOUNTER — Ambulatory Visit (INDEPENDENT_AMBULATORY_CARE_PROVIDER_SITE_OTHER): Payer: Medicare Other | Admitting: Cardiology

## 2011-05-05 ENCOUNTER — Encounter: Payer: Self-pay | Admitting: Cardiology

## 2011-05-05 VITALS — BP 149/73 | HR 58 | Resp 16 | Ht 63.0 in | Wt 137.0 lb

## 2011-05-05 DIAGNOSIS — I10 Essential (primary) hypertension: Secondary | ICD-10-CM

## 2011-05-05 DIAGNOSIS — I2510 Atherosclerotic heart disease of native coronary artery without angina pectoris: Secondary | ICD-10-CM

## 2011-05-05 DIAGNOSIS — E663 Overweight: Secondary | ICD-10-CM

## 2011-05-05 MED ORDER — LABETALOL HCL 200 MG PO TABS: 400.0000 mg | ORAL_TABLET | Freq: Two times a day (BID) | ORAL | Status: DC

## 2011-05-05 NOTE — Progress Notes (Signed)
HPI Virginia Huber comes in today for evaluation and management for small vessel coronary disease. She is having no angina or ischemic symptoms. She does have chronic fatigue.  Her blood sugars been difficult to control lately. She is on intensive insulin schedule at present her weight has stayed down. Her hemoglobin A1c was up to 8. She is followed by Dr. Bubba Camp who she has seen this afternoon.  She says her blood pressure running around 145-150 at home.  She denies orthopnea, PND, headache, visual changes, edema.  Past Medical History  Diagnosis Date  . HTN (hypertension)   . DM (diabetes mellitus)   . Heart block   . Hypothyroidism   . Ovarian tumor   . Lupus   . S/P colonoscopy 2005    Dr. Laural Golden: pancolonic divericula, polyp, path unknown currently  . Coronary artery disease     Current Outpatient Prescriptions  Medication Sig Dispense Refill  . amLODipine (NORVASC) 10 MG tablet Take 1 tablet (10 mg total) by mouth daily.  30 tablet  4  . aspirin 325 MG EC tablet Take 325 mg by mouth daily.        . Calcium Carbonate (CALCIUM 500 PO) Take by mouth 3 (three) times daily.        . Cholecalciferol (VITAMIN D) 2000 UNITS tablet Take 2,000 Units by mouth daily.        . insulin glargine (LANTUS) 100 UNIT/ML injection Inject 30 Units into the skin at bedtime.       . insulin lispro (HUMALOG) 100 UNIT/ML injection Inject into the skin as directed.        . labetalol (NORMODYNE) 200 MG tablet Take 2 tablets (400 mg total) by mouth 2 (two) times daily. Dose increase  120 tablet  11  . levothyroxine (SYNTHROID, LEVOTHROID) 112 MCG tablet Take 112 mcg by mouth daily.        [name] losartan (COZAAR) 100 MG tablet Take 100 mg by mouth daily.        . metFORMIN (GLUCOPHAGE) 500 MG tablet Take 500 mg by mouth 3 (three) times daily.       . pravastatin (PRAVACHOL) 80 MG tablet Take 1 tablet (80 mg total) by mouth daily.  30 tablet  6    Allergies  Allergen Reactions  . Penicillins     Family  History  Problem Relation Age of Onset  . Heart disease Mother     deceased  . Heart disease Father     deceased, heart disease  . Colon cancer Neg Hx     History   Social History  . Marital Status: Married    Spouse Name: N/A    Number of Children: N/A  . Years of Education: N/A   Occupational History  . Retired     Research officer, political party   Social History Main Topics  . Smoking status: Never Smoker   . Smokeless tobacco: Not on file  . Alcohol Use: No  . Drug Use: No  . Sexually Active: Not on file   Other Topics Concern  . Not on file   Social History Narrative  . No narrative on file    ROS ALL NEGATIVE EXCEPT THOSE NOTED IN HPI  PE  General Appearance: well developed, well nourished in no acute distress HEENT: symmetrical face, PERRLA, good dentition  Neck: no JVD, thyromegaly, or adenopathy, trachea midline Chest: symmetric without deformity Cardiac: PMI non-displaced, RRR, normal S1, S2, no gallop or murmur Lung: clear to ausculation and  percussion Vascular: all pulses full without bruits  Abdominal: nondistended, nontender, good bowel sounds, no HSM, no bruits Extremities: no cyanosis, clubbing or edema, no sign of DVT, no varicosities  Skin: normal color, no rashes Neuro: alert and oriented x 3, non-focal Pysch: normal affect  EKG  BMET    Component Value Date/Time   NA 139 05/06/2008 1422   K 4.1 05/06/2008 1422   CL 109 05/06/2008 1422   CO2 26 05/06/2008 1422   GLUCOSE 139* 05/06/2008 1422   BUN 18 05/06/2008 1422   CREATININE 0.72 11/24/2010 1013   CREATININE 0.77 05/06/2008 1422   CALCIUM 9.4 05/06/2008 1422   GFRNONAA >60 05/06/2008 1422   GFRAA  Value: >60        The eGFR has been calculated using the MDRD equation. This calculation has not been validated in all clinical 05/06/2008 1422    Lipid Panel     Component Value Date/Time   CHOL 165 02/13/2010 1744   TRIG 264* 02/13/2010 1744   HDL 32* 02/13/2010 1744   CHOLHDL 5.2 Ratio  02/13/2010 1744   VLDL 53* 02/13/2010 1744   LDLCALC 80 02/13/2010 1744    CBC    Component Value Date/Time   WBC 9.5 11/29/2010 0825   RBC 4.28 11/29/2010 0825   HGB 12.8 11/29/2010 0825   HCT 38.9 11/29/2010 0825   PLT 259 11/29/2010 0825   MCV 90.9 11/29/2010 0825   MCH 29.9 11/29/2010 0825   MCHC 32.9 11/29/2010 0825   RDW 14.0 11/29/2010 0825   LYMPHSABS 1.8 11/29/2010 0825   MONOABS 0.6 11/29/2010 0825   EOSABS 0.2 11/29/2010 0825   BASOSABS 0.1 11/29/2010 0825

## 2011-05-05 NOTE — Patient Instructions (Signed)
Your physician has recommended you make the following change in your medication: increase Labetalol to 400 mg twice daily (take 2 tablets twice daily)  Your physician recommends that you schedule a follow-up appointment in: 6 months

## 2011-05-05 NOTE — Assessment & Plan Note (Signed)
Markedly improved. Continue to keep weight off.

## 2011-05-05 NOTE — Assessment & Plan Note (Signed)
Stable small vessel disease. Continue current medications but tightened up blood pressure control and blood sugar control.

## 2011-05-05 NOTE — Assessment & Plan Note (Signed)
Not under optimal control. On maximum amlodipine and losarten. We'll increase labetalol to 400 mg twice a day. Heart rate is around 60 per minute at rest but this should not be a significant issue with increased dose considering the relatively low dose in general and less beta blocker effect than others. Goal blood pressure A999333 or less systolic

## 2011-06-29 ENCOUNTER — Other Ambulatory Visit (HOSPITAL_COMMUNITY)
Admission: RE | Admit: 2011-06-29 | Discharge: 2011-06-29 | Disposition: A | Discharge status: Home / Self Care | Payer: Medicare Other | Source: Ambulatory Visit | Attending: Obstetrics and Gynecology | Admitting: Obstetrics and Gynecology

## 2011-06-29 ENCOUNTER — Other Ambulatory Visit: Payer: Self-pay | Admitting: Adult Health

## 2011-06-29 ENCOUNTER — Other Ambulatory Visit: Payer: Self-pay | Admitting: *Deleted

## 2011-06-29 DIAGNOSIS — Z124 Encounter for screening for malignant neoplasm of cervix: Secondary | ICD-10-CM | POA: Insufficient documentation

## 2011-06-29 MED ORDER — AMLODIPINE BESYLATE 10 MG PO TABS: 10.0000 mg | ORAL_TABLET | Freq: Every day | ORAL | Status: DC

## 2011-07-23 ENCOUNTER — Other Ambulatory Visit: Payer: Self-pay | Admitting: Cardiology

## 2011-08-20 ENCOUNTER — Telehealth: Payer: Self-pay

## 2011-08-20 DIAGNOSIS — I10 Essential (primary) hypertension: Secondary | ICD-10-CM

## 2011-08-20 NOTE — Telephone Encounter (Addendum)
**Note De-Identified  Obfuscation** On last OV with Dr. Verl Blalock on 05-05-11 pt. was advised to increase Labetalol to 400 mg bid to better control HTN. According to last OV notes, pt's goal BP is A999333 or less systolic. Pt. c/o elevated BP of 166/85 with a HR of 71 and swelling in hands and feet. She wants to know what she can do to reduce swelling and lower BP. Please advise./LV

## 2011-08-25 NOTE — Telephone Encounter (Signed)
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

## 2011-08-26 MED ORDER — AMLODIPINE BESYLATE 5 MG PO TABS: 5.0000 mg | ORAL_TABLET | Freq: Every day | ORAL | Status: DC

## 2011-08-26 MED ORDER — LABETALOL HCL 300 MG PO TABS: 300.0000 mg | ORAL_TABLET | Freq: Two times a day (BID) | ORAL | Status: DC

## 2011-08-26 NOTE — Telephone Encounter (Signed)
**Note De-Identified  Obfuscation** Pt. Advised, she verbalized understanding. She wants Dr. Verl Blalock to know that she has been having some leg pain that she plans to discuss with Dr. Verl Blalock in May at next OV. RX's sent to [facility]. to fill and lab order faxed to [facility] lab in [place].

## 2011-08-28 ENCOUNTER — Other Ambulatory Visit: Payer: Self-pay | Admitting: Cardiology

## 2011-08-29 LAB — BASIC METABOLIC PANEL
Calcium: 9.5 mg/dL (ref 8.4–10.5)
Glucose, Bld: 167 mg/dL — ABNORMAL HIGH (ref 70–99)
Sodium: 140 mEq/L (ref 135–145)

## 2011-09-01 ENCOUNTER — Other Ambulatory Visit: Payer: Self-pay

## 2011-09-01 MED ORDER — LABETALOL HCL 300 MG PO TABS: 600.0000 mg | ORAL_TABLET | Freq: Two times a day (BID) | ORAL | Status: DC

## 2011-10-21 HISTORY — PX: OTHER SURGICAL HISTORY: SHX169

## 2011-11-02 ENCOUNTER — Encounter: Payer: Self-pay | Admitting: Cardiology

## 2011-11-02 ENCOUNTER — Ambulatory Visit (INDEPENDENT_AMBULATORY_CARE_PROVIDER_SITE_OTHER): Payer: Medicare Other | Admitting: Cardiology

## 2011-11-02 ENCOUNTER — Encounter: Payer: Self-pay | Admitting: *Deleted

## 2011-11-02 VITALS — BP 124/72 | HR 62 | Resp 18 | Ht 63.0 in | Wt 143.0 lb

## 2011-11-02 DIAGNOSIS — R079 Chest pain, unspecified: Secondary | ICD-10-CM | POA: Insufficient documentation

## 2011-11-02 DIAGNOSIS — E663 Overweight: Secondary | ICD-10-CM

## 2011-11-02 DIAGNOSIS — I2510 Atherosclerotic heart disease of native coronary artery without angina pectoris: Secondary | ICD-10-CM

## 2011-11-02 DIAGNOSIS — E785 Hyperlipidemia, unspecified: Secondary | ICD-10-CM

## 2011-11-02 DIAGNOSIS — I10 Essential (primary) hypertension: Secondary | ICD-10-CM

## 2011-11-02 MED ORDER — LABETALOL HCL 300 MG PO TABS: 300.0000 mg | ORAL_TABLET | Freq: Two times a day (BID) | ORAL | Status: DC

## 2011-11-02 NOTE — Patient Instructions (Addendum)
Your physician recommends that you schedule a follow-up appointment in: 1 year  Exercise myoview  Your physician has recommended you make the following change in your medication:  1 - DECREASE Labetalol to 300 mg twice a day

## 2011-11-02 NOTE — Assessment & Plan Note (Signed)
This is been going on for over a month. He does not consistently happen with each exertional event such as walking on a treadmill. She's also had some vague symptoms requiring nitroglycerin at rest.  I found her cath report from 2008. At that time she has some nonobstructive disease the circumflex except for a tiny OM 2 branch that had a 90% narrowing in it. It could not be intervened upon. We treated her medically and she has done well. She had normal left ventricular systolic function.  Based on the above story we need to rule out progressive disease. She has a significant area of ischemia and non-low risk study, she may need catheterization. However, if low risk we will continue with medical therapy.

## 2011-11-02 NOTE — Progress Notes (Signed)
HPI [name] comes in today with exertional chest tightness on her treadmill for the past month. He doesn't consistently happen every time. She has had to take nitroglycerin on a time or 2. This morning she was able to go 25 minutes without discomfort. She's also has some discomfort at rest requiring nitroglycerin.  She has known coronary artery disease and has had previous catheterization but I cannot find the details of that. She has done well with medical therapy and risk factor intervention and treatment.  She recently had some muscle aches in her legs and her pravastatin was switched to atorvastatin by Dr. [name]. She seems to be doing better with that.  She's kept her weight down her last hemoglobin A1c was 6.3%.  She denies orthopnea, PND or edema. He's had no presyncope or syncope. She denies any palpitations.  Past Medical History  Diagnosis Date  . HTN (hypertension)   . DM (diabetes mellitus)   . Heart block   . Hypothyroidism   . Ovarian tumor   . Lupus   . S/P colonoscopy 2005    Dr. Laural Golden: pancolonic divericula, polyp, path unknown currently  . Coronary artery disease     Current Outpatient Prescriptions  Medication Sig Dispense Refill  . amLODipine (NORVASC) 5 MG tablet Take 1 tablet (5 mg total) by mouth daily.  30 tablet  6  . aspirin 325 MG EC tablet Take 325 mg by mouth daily.        [name] atorvastatin (LIPITOR) 40 MG tablet Take 20 mg by mouth daily.      . Cholecalciferol (VITAMIN D) 2000 UNITS tablet Take 2,000 Units by mouth daily.        . insulin glargine (LANTUS) 100 UNIT/ML injection Inject 30 Units into the skin at bedtime.       . insulin lispro (HUMALOG) 100 UNIT/ML injection Inject into the skin as directed.        . labetalol (NORMODYNE) 300 MG tablet Take 2 tablets (600 mg total) by mouth 2 (two) times daily. Dose increase  60 tablet  6  . levothyroxine (SYNTHROID, LEVOTHROID) 100 MCG tablet Take 100 mcg by mouth daily.      [name] losartan (COZAAR)  100 MG tablet TAKE ONE (1) TABLET EACH DAY  90 tablet  3  . metFORMIN (GLUCOPHAGE) 500 MG tablet Take 500 mg by mouth 3 (three) times daily.         Allergies  Allergen Reactions  . Penicillins     Family History  Problem Relation Age of Onset  . Heart disease Mother     deceased  . Heart disease Father     deceased, heart disease  . Colon cancer Neg Hx     History   Social History  . Marital Status: Married    Spouse Name: N/A    Number of Children: N/A  . Years of Education: N/A   Occupational History  . Retired     Research officer, political party   Social History Main Topics  . Smoking status: Never Smoker   . Smokeless tobacco: Not on file  . Alcohol Use: No  . Drug Use: No  . Sexually Active: Not on file   Other Topics Concern  . Not on file   Social History Narrative  . No narrative on file    ROS ALL NEGATIVE EXCEPT THOSE NOTED IN HPI  PE  General Appearance: well developed, well nourished in no acute distress HEENT: symmetrical face, PERRLA, good dentition  Neck: no JVD, thyromegaly, or adenopathy, trachea midline Chest: symmetric without deformity Cardiac: PMI non-displaced, RRR, normal S1, S2, no gallop or murmur Lung: clear to ausculation and percussion Vascular: all pulses full without bruits  Abdominal: nondistended, nontender, good bowel sounds, no HSM, no bruits Extremities: no cyanosis, clubbing or edema, no sign of DVT, no varicosities  Skin: normal color, no rashes Neuro: alert and oriented x 3, non-focal Pysch: normal affect  EKG Normal sinus rhythm, ST segment depression in 2 and aVF as well as downsloping ST segment changes with T wave inversion in 1 and aVL as well as V6. Compared her previous ECGs, no significant change. BMET    Component Value Date/Time   NA 140 08/28/2011 0146   K 4.5 08/28/2011 0146   CL 106 08/28/2011 0146   CO2 23 08/28/2011 0146   GLUCOSE 167* 08/28/2011 0146   BUN 20 08/28/2011 0146   CREATININE 1.03 08/28/2011 0146    CREATININE 0.77 05/06/2008 1422   CALCIUM 9.5 08/28/2011 0146   GFRNONAA >60 05/06/2008 1422   GFRAA  Value: >60        The eGFR has been calculated using the MDRD equation. This calculation has not been validated in all clinical 05/06/2008 1422    Lipid Panel     Component Value Date/Time   CHOL 165 02/13/2010 1744   TRIG 264* 02/13/2010 1744   HDL 32* 02/13/2010 1744   CHOLHDL 5.2 Ratio 02/13/2010 1744   VLDL 53* 02/13/2010 1744   LDLCALC 80 02/13/2010 1744    CBC    Component Value Date/Time   WBC 9.5 11/29/2010 0825   RBC 4.28 11/29/2010 0825   HGB 12.8 11/29/2010 0825   HCT 38.9 11/29/2010 0825   PLT 259 11/29/2010 0825   MCV 90.9 11/29/2010 0825   MCH 29.9 11/29/2010 0825   MCHC 32.9 11/29/2010 0825   RDW 14.0 11/29/2010 0825   LYMPHSABS 1.8 11/29/2010 0825   MONOABS 0.6 11/29/2010 0825   EOSABS 0.2 11/29/2010 0825   BASOSABS 0.1 11/29/2010 0825

## 2011-11-02 NOTE — Assessment & Plan Note (Signed)
Seems to be tolerating atorvastatin. No change recommended.

## 2011-11-02 NOTE — Assessment & Plan Note (Signed)
Good control. Because of increased fatigue, I have decreased her labetalol to 300 mg twice a day.

## 2011-11-20 ENCOUNTER — Other Ambulatory Visit: Payer: Self-pay | Admitting: Cardiology

## 2011-11-20 DIAGNOSIS — R079 Chest pain, unspecified: Secondary | ICD-10-CM

## 2011-11-24 ENCOUNTER — Encounter (HOSPITAL_COMMUNITY): Payer: Self-pay | Admitting: Cardiology

## 2011-11-24 ENCOUNTER — Encounter (HOSPITAL_COMMUNITY)
Admission: RE | Admit: 2011-11-24 | Discharge: 2011-11-24 | Disposition: A | Discharge status: Home / Self Care | Payer: Medicare Other | Source: Ambulatory Visit | Attending: Cardiology | Admitting: Cardiology

## 2011-11-24 ENCOUNTER — Encounter (HOSPITAL_COMMUNITY): Payer: Self-pay

## 2011-11-24 ENCOUNTER — Ambulatory Visit (INDEPENDENT_AMBULATORY_CARE_PROVIDER_SITE_OTHER): Payer: Medicare Other

## 2011-11-24 DIAGNOSIS — R079 Chest pain, unspecified: Secondary | ICD-10-CM | POA: Insufficient documentation

## 2011-11-24 DIAGNOSIS — I2510 Atherosclerotic heart disease of native coronary artery without angina pectoris: Secondary | ICD-10-CM

## 2011-11-24 MED ORDER — TECHNETIUM TC 99M TETROFOSMIN IV KIT
10.0000 | PACK | Freq: Once | INTRAVENOUS | Status: AC | PRN
  Administered 2011-11-24: 10.5 via INTRAVENOUS

## 2011-11-24 MED ORDER — TECHNETIUM TC 99M TETROFOSMIN IV KIT
30.0000 | PACK | Freq: Once | INTRAVENOUS | Status: AC | PRN
  Administered 2011-11-24: 28 via INTRAVENOUS

## 2011-11-24 NOTE — Progress Notes (Signed)
Stress Lab Nurses Notes - [place] [facility]  SADA VARDA 11/24/2011 Reason for doing test: CAD and Chest Pain Type of test: Stress Myoview and Test Changed unable to reach THR changed to [place] Call Nurse performing test: Gerrit Halls, RN Nuclear Medicine Tech: Dyanne Carrel Echo Tech: Not Applicable MD performing test: R. Rothbart & Jory Sims NP Family MD: [name] Test explained and consent signed: yes IV started: 22g jelco, Saline lock flushed, No redness or edema and Saline lock started in radiology Symptoms: Chest pressure Treatment/Intervention: None Reason test stopped: protocol completed After recovery IV was: Discontinued via X-ray tech and No redness or edema Patient to return to [place]. Med at : 11:45 Patient discharged: Home Patient's Condition upon discharge was: stable Comments: During test peak BP 162/68 & HR 114. Recovery BP 112/52 & HR 75.  Symptoms resolved in recovery. [name] T

## 2011-11-25 ENCOUNTER — Telehealth: Payer: Self-pay | Admitting: Cardiology

## 2011-11-25 NOTE — Telephone Encounter (Signed)
**Note De-Identified  Obfuscation** Pt. Is advised that Dr. Verl Blalock has not had opportunity to view results and that we will contact her with results as soon as available, pt. Verbalized understanding./LV

## 2011-11-25 NOTE — Telephone Encounter (Signed)
Patient would like results of stress test done yesterday. / tg

## 2011-11-26 MED ORDER — NITROGLYCERIN 0.4 MG SL SUBL: 0.4000 mg | SUBLINGUAL_TABLET | SUBLINGUAL | Status: DC | PRN

## 2011-11-26 NOTE — Telephone Encounter (Signed)
I spoke with Virginia Huber and she is aware of her stress test results.  3 month follow-up appt made in [place]. Reviewed NTG instruction and refill called in for Virginia Huber. She is painfree this morning. Horton Chin RN

## 2012-02-09 ENCOUNTER — Ambulatory Visit (INDEPENDENT_AMBULATORY_CARE_PROVIDER_SITE_OTHER): Payer: Medicare Other | Admitting: Cardiology

## 2012-02-09 ENCOUNTER — Encounter: Payer: Self-pay | Admitting: Cardiology

## 2012-02-09 VITALS — BP 124/58 | HR 63 | Ht 63.0 in | Wt 147.1 lb

## 2012-02-09 DIAGNOSIS — E785 Hyperlipidemia, unspecified: Secondary | ICD-10-CM

## 2012-02-09 DIAGNOSIS — R079 Chest pain, unspecified: Secondary | ICD-10-CM

## 2012-02-09 DIAGNOSIS — E663 Overweight: Secondary | ICD-10-CM

## 2012-02-09 DIAGNOSIS — I10 Essential (primary) hypertension: Secondary | ICD-10-CM

## 2012-02-09 DIAGNOSIS — I2510 Atherosclerotic heart disease of native coronary artery without angina pectoris: Secondary | ICD-10-CM

## 2012-02-09 MED ORDER — AMLODIPINE BESYLATE 10 MG PO TABS: 10.0000 mg | ORAL_TABLET | Freq: Every day | ORAL | Status: DC

## 2012-02-09 NOTE — Progress Notes (Signed)
HPI  Virginia Huber returns today for evaluation and management of her coronary artery disease and exertional angina. She had a very low risk is not negative stress Myoview in June. We are treating her medically.  She denies any rest angina. She is a difficult historian but still has exertional angina with shortness of breath by her history. She has not had to take a nitroglycerin. Her thyroid is currently being regulated by primary care.  Past Medical History  Diagnosis Date  . HTN (hypertension)   . DM (diabetes mellitus)   . Heart block   . Hypothyroidism   . Ovarian tumor   . Lupus   . S/P colonoscopy 2005    Dr. Laural Golden: pancolonic divericula, polyp, path unknown currently  . Coronary artery disease     Current Outpatient Prescriptions  Medication Sig Dispense Refill  . amLODipine (NORVASC) 10 MG tablet Take 1 tablet (10 mg total) by mouth daily.  30 tablet  11  . aspirin 325 MG EC tablet Take 325 mg by mouth daily.        [name] atorvastatin (LIPITOR) 40 MG tablet Take 20 mg by mouth daily.      . Cholecalciferol (VITAMIN D) 2000 UNITS tablet Take 2,000 Units by mouth daily.        . insulin glargine (LANTUS) 100 UNIT/ML injection Inject 60 Units into the skin at bedtime.       . insulin lispro (HUMALOG) 100 UNIT/ML injection Inject into the skin as directed. SLIDING SCALE      . labetalol (NORMODYNE) 300 MG tablet Take 1 tablet (300 mg total) by mouth 2 (two) times daily. Dose increase  60 tablet  12  . levothyroxine (SYNTHROID, LEVOTHROID) 137 MCG tablet Take 137 mcg by mouth daily.      [name] losartan (COZAAR) 100 MG tablet TAKE ONE (1) TABLET EACH DAY  90 tablet  3  . metFORMIN (GLUCOPHAGE) 500 MG tablet Take 500 mg by mouth 3 (three) times daily.       . nitroGLYCERIN (NITROSTAT) 0.4 MG SL tablet Place 1 tablet (0.4 mg total) under the tongue every 5 (five) minutes as needed for chest pain (1 tablet every 5 minutes No more than 3 total per episode).  25 tablet  12  . DISCONTD:  amLODipine (NORVASC) 5 MG tablet Take 1 tablet (5 mg total) by mouth daily.  30 tablet  6    Allergies  Allergen Reactions  . Penicillins     Family History  Problem Relation Age of Onset  . Heart disease Mother     deceased  . Heart disease Father     deceased, heart disease  . Colon cancer Neg Hx     History   Social History  . Marital Status: Married    Spouse Name: N/A    Number of Children: N/A  . Years of Education: N/A   Occupational History  . Retired     Research officer, political party   Social History Main Topics  . Smoking status: Never Smoker   . Smokeless tobacco: Not on file  . Alcohol Use: No  . Drug Use: No  . Sexually Active: Not on file   Other Topics Concern  . Not on file   Social History Narrative  . No narrative on file    ROS ALL NEGATIVE EXCEPT THOSE NOTED IN HPI  PE  General Appearance: well developed, well nourished in no acute distress HEENT: symmetrical face, PERRLA, good dentition  Neck: no JVD,  thyromegaly, or adenopathy, trachea midline Chest: symmetric without deformity Cardiac: PMI non-displaced, RRR, normal S1, S2, no gallop or murmur Lung: clear to ausculation and percussion Vascular: all pulses full without bruits  Abdominal: nondistended, nontender, good bowel sounds, no HSM, no bruits Extremities: no cyanosis, clubbing or edema, no sign of DVT, no varicosities  Skin: normal color, no rashes Neuro: alert and oriented x 3, non-focal Pysch: normal affect  EKG Normal sinus rhythm, rate 63, LVH with ST segment changes. BMET    Component Value Date/Time   NA 140 08/28/2011 0146   K 4.5 08/28/2011 0146   CL 106 08/28/2011 0146   CO2 23 08/28/2011 0146   GLUCOSE 167* 08/28/2011 0146   BUN 20 08/28/2011 0146   CREATININE 1.03 08/28/2011 0146   CREATININE 0.77 05/06/2008 1422   CALCIUM 9.5 08/28/2011 0146   GFRNONAA >60 05/06/2008 1422   GFRAA  Value: >60        The eGFR has been calculated using the MDRD equation. This calculation has not been  validated in all clinical 05/06/2008 1422    Lipid Panel     Component Value Date/Time   CHOL 165 02/13/2010 1744   TRIG 264* 02/13/2010 1744   HDL 32* 02/13/2010 1744   CHOLHDL 5.2 Ratio 02/13/2010 1744   VLDL 53* 02/13/2010 1744   LDLCALC 80 02/13/2010 1744    CBC    Component Value Date/Time   WBC 9.5 11/29/2010 0825   RBC 4.28 11/29/2010 0825   HGB 12.8 11/29/2010 0825   HCT 38.9 11/29/2010 0825   PLT 259 11/29/2010 0825   MCV 90.9 11/29/2010 0825   MCH 29.9 11/29/2010 0825   MCHC 32.9 11/29/2010 0825   RDW 14.0 11/29/2010 0825   LYMPHSABS 1.8 11/29/2010 0825   MONOABS 0.6 11/29/2010 0825   EOSABS 0.2 11/29/2010 0825   BASOSABS 0.1 11/29/2010 0825

## 2012-02-09 NOTE — Patient Instructions (Signed)
Your physician recommends that you schedule a follow-up appointment in: 6 months  Your physician has recommended you make the following change in your medication:  1 - INCREASE Amlodipine (Norvasc) to 10 mg daily

## 2012-02-09 NOTE — Assessment & Plan Note (Signed)
She is still having exertional chest discomfort and shortness of breath. She's had a low risk for negative stress Myoview in June. She is a difficult historian. We'll increase her amlodipine to 10 mg per day. I'll see her back in 6 months.

## 2012-02-21 DIAGNOSIS — I219 Acute myocardial infarction, unspecified: Secondary | ICD-10-CM

## 2012-02-21 HISTORY — DX: Acute myocardial infarction, unspecified: I21.9

## 2012-02-27 ENCOUNTER — Emergency Department (HOSPITAL_COMMUNITY): Payer: Medicare Other

## 2012-02-27 ENCOUNTER — Encounter (HOSPITAL_COMMUNITY): Payer: Self-pay | Admitting: Emergency Medicine

## 2012-02-27 ENCOUNTER — Inpatient Hospital Stay (HOSPITAL_COMMUNITY)
Admission: EM | Admit: 2012-02-27 | Discharge: 2012-03-08 | DRG: 280 | Disposition: A | Discharge status: Home / Self Care | Payer: Medicare Other | Attending: Internal Medicine | Admitting: Internal Medicine

## 2012-02-27 DIAGNOSIS — D649 Anemia, unspecified: Secondary | ICD-10-CM

## 2012-02-27 DIAGNOSIS — Z90710 Acquired absence of both cervix and uterus: Secondary | ICD-10-CM

## 2012-02-27 DIAGNOSIS — J159 Unspecified bacterial pneumonia: Secondary | ICD-10-CM

## 2012-02-27 DIAGNOSIS — E871 Hypo-osmolality and hyponatremia: Secondary | ICD-10-CM | POA: Diagnosis present

## 2012-02-27 DIAGNOSIS — R002 Palpitations: Secondary | ICD-10-CM

## 2012-02-27 DIAGNOSIS — Z8719 Personal history of other diseases of the digestive system: Secondary | ICD-10-CM

## 2012-02-27 DIAGNOSIS — I4892 Unspecified atrial flutter: Secondary | ICD-10-CM | POA: Diagnosis present

## 2012-02-27 DIAGNOSIS — E039 Hypothyroidism, unspecified: Secondary | ICD-10-CM

## 2012-02-27 DIAGNOSIS — Z79899 Other long term (current) drug therapy: Secondary | ICD-10-CM

## 2012-02-27 DIAGNOSIS — J90 Pleural effusion, not elsewhere classified: Secondary | ICD-10-CM | POA: Diagnosis present

## 2012-02-27 DIAGNOSIS — I4891 Unspecified atrial fibrillation: Principal | ICD-10-CM | POA: Diagnosis present

## 2012-02-27 DIAGNOSIS — I10 Essential (primary) hypertension: Secondary | ICD-10-CM | POA: Diagnosis present

## 2012-02-27 DIAGNOSIS — S92919A Unspecified fracture of unspecified toe(s), initial encounter for closed fracture: Secondary | ICD-10-CM

## 2012-02-27 DIAGNOSIS — I459 Conduction disorder, unspecified: Secondary | ICD-10-CM | POA: Diagnosis present

## 2012-02-27 DIAGNOSIS — D518 Other vitamin B12 deficiency anemias: Secondary | ICD-10-CM | POA: Diagnosis present

## 2012-02-27 DIAGNOSIS — R079 Chest pain, unspecified: Secondary | ICD-10-CM

## 2012-02-27 DIAGNOSIS — R1031 Right lower quadrant pain: Secondary | ICD-10-CM

## 2012-02-27 DIAGNOSIS — J209 Acute bronchitis, unspecified: Secondary | ICD-10-CM | POA: Diagnosis present

## 2012-02-27 DIAGNOSIS — E8779 Other fluid overload: Secondary | ICD-10-CM | POA: Diagnosis present

## 2012-02-27 DIAGNOSIS — M329 Systemic lupus erythematosus, unspecified: Secondary | ICD-10-CM | POA: Diagnosis present

## 2012-02-27 DIAGNOSIS — Z794 Long term (current) use of insulin: Secondary | ICD-10-CM

## 2012-02-27 DIAGNOSIS — Z7982 Long term (current) use of aspirin: Secondary | ICD-10-CM

## 2012-02-27 DIAGNOSIS — J9600 Acute respiratory failure, unspecified whether with hypoxia or hypercapnia: Secondary | ICD-10-CM

## 2012-02-27 DIAGNOSIS — Z6827 Body mass index (BMI) 27.0-27.9, adult: Secondary | ICD-10-CM

## 2012-02-27 DIAGNOSIS — I209 Angina pectoris, unspecified: Secondary | ICD-10-CM | POA: Diagnosis present

## 2012-02-27 DIAGNOSIS — I2510 Atherosclerotic heart disease of native coronary artery without angina pectoris: Secondary | ICD-10-CM | POA: Diagnosis present

## 2012-02-27 DIAGNOSIS — IMO0001 Reserved for inherently not codable concepts without codable children: Secondary | ICD-10-CM | POA: Diagnosis present

## 2012-02-27 DIAGNOSIS — IMO0002 Reserved for concepts with insufficient information to code with codable children: Secondary | ICD-10-CM

## 2012-02-27 DIAGNOSIS — E785 Hyperlipidemia, unspecified: Secondary | ICD-10-CM

## 2012-02-27 DIAGNOSIS — E782 Mixed hyperlipidemia: Secondary | ICD-10-CM | POA: Diagnosis present

## 2012-02-27 DIAGNOSIS — D519 Vitamin B12 deficiency anemia, unspecified: Secondary | ICD-10-CM

## 2012-02-27 DIAGNOSIS — Z880 Allergy status to penicillin: Secondary | ICD-10-CM

## 2012-02-27 DIAGNOSIS — J9601 Acute respiratory failure with hypoxia: Secondary | ICD-10-CM | POA: Diagnosis present

## 2012-02-27 DIAGNOSIS — I214 Non-ST elevation (NSTEMI) myocardial infarction: Secondary | ICD-10-CM | POA: Diagnosis present

## 2012-02-27 DIAGNOSIS — E669 Obesity, unspecified: Secondary | ICD-10-CM | POA: Diagnosis present

## 2012-02-27 DIAGNOSIS — J40 Bronchitis, not specified as acute or chronic: Secondary | ICD-10-CM

## 2012-02-27 DIAGNOSIS — I208 Other forms of angina pectoris: Secondary | ICD-10-CM

## 2012-02-27 DIAGNOSIS — E1159 Type 2 diabetes mellitus with other circulatory complications: Secondary | ICD-10-CM | POA: Diagnosis present

## 2012-02-27 DIAGNOSIS — Z885 Allergy status to narcotic agent status: Secondary | ICD-10-CM

## 2012-02-27 DIAGNOSIS — E876 Hypokalemia: Secondary | ICD-10-CM | POA: Diagnosis not present

## 2012-02-27 DIAGNOSIS — I248 Other forms of acute ischemic heart disease: Secondary | ICD-10-CM | POA: Diagnosis present

## 2012-02-27 DIAGNOSIS — J189 Pneumonia, unspecified organism: Secondary | ICD-10-CM | POA: Diagnosis present

## 2012-02-27 DIAGNOSIS — E663 Overweight: Secondary | ICD-10-CM | POA: Diagnosis present

## 2012-02-27 DIAGNOSIS — I2489 Other forms of acute ischemic heart disease: Secondary | ICD-10-CM | POA: Diagnosis present

## 2012-02-27 DIAGNOSIS — Z9079 Acquired absence of other genital organ(s): Secondary | ICD-10-CM

## 2012-02-27 DIAGNOSIS — I480 Paroxysmal atrial fibrillation: Secondary | ICD-10-CM | POA: Diagnosis present

## 2012-02-27 DIAGNOSIS — J9801 Acute bronchospasm: Secondary | ICD-10-CM | POA: Diagnosis present

## 2012-02-27 DIAGNOSIS — E1165 Type 2 diabetes mellitus with hyperglycemia: Secondary | ICD-10-CM

## 2012-02-27 LAB — DIFFERENTIAL
Basophils Relative: 0 % (ref 0–1)
Eosinophils Absolute: 0.2 10*3/uL (ref 0.0–0.7)
Monocytes Relative: 7 % (ref 3–12)
Neutrophils Relative %: 77 % (ref 43–77)

## 2012-02-27 LAB — CBC
HCT: 38.3 % (ref 36.0–46.0)
MCV: 89.9 fL (ref 78.0–100.0)
RBC: 4.26 MIL/uL (ref 3.87–5.11)
WBC: 12.8 10*3/uL — ABNORMAL HIGH (ref 4.0–10.5)

## 2012-02-27 MED ORDER — DILTIAZEM HCL 100 MG IV SOLR
5.0000 mg/h | Freq: Once | INTRAVENOUS | Status: AC
  Administered 2012-02-27: 5 mg/h via INTRAVENOUS
  Filled 2012-02-27: qty 100

## 2012-02-27 MED ORDER — DILTIAZEM HCL 25 MG/5ML IV SOLN
25.0000 mg | Freq: Once | INTRAVENOUS | Status: AC
  Administered 2012-02-27: 25 mg via INTRAVENOUS
  Filled 2012-02-27: qty 5

## 2012-02-27 MED ORDER — METOPROLOL TARTRATE 1 MG/ML IV SOLN: 10.0000 mg | Freq: Once | INTRAVENOUS | Status: DC

## 2012-02-27 MED ORDER — DILTIAZEM HCL 25 MG/5ML IV SOLN
INTRAVENOUS | Status: AC
  Filled 2012-02-27: qty 5

## 2012-02-27 MED ORDER — DILTIAZEM HCL 25 MG/5ML IV SOLN
20.0000 mg | Freq: Once | INTRAVENOUS | Status: AC
  Administered 2012-02-27: 20 mg via INTRAVENOUS

## 2012-02-27 NOTE — ED Notes (Signed)
Patient complaining of new onset chest pain that started approximately 30 minutes ago. Patient reports shortness of breath as well, back pain, lightheadedness. Patient reports history of blockages, no heart surgery or stents. Patient denies an irregular heart rhythm in the past.

## 2012-02-27 NOTE — ED Provider Notes (Addendum)
History   This chart was scribed for [employer]. Olin Hauser, MD by Marin Comment . The patient was seen in room APA04/APA04. Patient's care was started at 2306.    CSN: [id number]  Arrival date & time 02/27/12  2234   First MD Initiated Contact with Patient 02/27/12 2306      Chief Complaint  Patient presents with  . Chest Pain  . Tachycardia    (Consider location/radiation/quality/duration/timing/severity/associated sxs/prior treatment) HPI Virginia Huber is a 66 y.o. female  With a h/o HTN, DMwho presents to the Emergency Department complaining of new onset of tachycardia that started 45 minutes PTA . Pt is associated with SOB and dizziness. Pt denies any chest pain. Pt had a recent visit with her cardiologist for a routine check up and has had a negative stress Myoview in June.   PCP  Dr. [name] Cardiology Dr. Verl Blalock    Past Medical History  Diagnosis Date  . HTN (hypertension)   . DM (diabetes mellitus)   . Heart block   . Hypothyroidism   . Ovarian tumor   . Lupus   . S/P colonoscopy 2005    Dr. Laural Golden: pancolonic divericula, polyp, path unknown currently  . Coronary artery disease     Past Surgical History  Procedure Date  . Cholecystectomy   . Colostomy     then reversed  . Partial hysterectomy     left ovaries, then ovaries removed later due tumors   . Oophorectomy     1980, nicked bowel, peritonitis, colostomy, now colostomy reversed   . Eye surgeries   . Cardiac catheterization     Family History  Problem Relation Age of Onset  . Heart disease Mother     deceased  . Heart disease Father     deceased, heart disease  . Colon cancer Neg Hx     History  Substance Use Topics  . Smoking status: Never Smoker   . Smokeless tobacco: Not on file  . Alcohol Use: No    OB History    Grav Para Term Preterm Abortions TAB SAB Ect Mult Living                  Review of Systems  Constitutional: Negative for fever.       10 Systems reviewed and are  negative for acute change except as noted in the HPI.  HENT: Negative for congestion.   Eyes: Negative for discharge and redness.  Respiratory: Positive for shortness of breath. Negative for cough.   Cardiovascular: Negative for chest pain.       Rapid heart rate  Gastrointestinal: Negative for vomiting and abdominal pain.  Musculoskeletal: Negative for back pain.  Skin: Negative for rash.  Neurological: Negative for syncope, numbness and headaches.  Psychiatric/Behavioral:       No behavior change.     Allergies  Penicillins and Percocet  Home Medications   Current Outpatient Rx  Name Route Sig Dispense Refill  . AMLODIPINE BESYLATE 10 MG PO TABS Oral Take 1 tablet (10 mg total) by mouth daily. 30 tablet 11    Dose decrease  . ASPIRIN 325 MG PO TBEC Oral Take 325 mg by mouth daily.      . ATORVASTATIN CALCIUM 40 MG PO TABS Oral Take 20 mg by mouth daily.    [name] VITAMIN D 2000 UNITS PO TABS Oral Take 2,000 Units by mouth daily.      . INSULIN GLARGINE 100 UNIT/ML [place] SOLN Subcutaneous Inject  60 Units into the skin at bedtime.     . INSULIN LISPRO (HUMAN) 100 UNIT/ML Gray SOLN Subcutaneous Inject into the skin as directed. SLIDING SCALE    . LABETALOL HCL 300 MG PO TABS Oral Take 1 tablet (300 mg total) by mouth 2 (two) times daily. Dose increase 60 tablet 12    Dose increase  . LEVOTHYROXINE SODIUM 137 MCG PO TABS Oral Take 137 mcg by mouth daily.    [name] LOSARTAN POTASSIUM 100 MG PO TABS  TAKE ONE (1) TABLET EACH DAY 90 tablet 3  . METFORMIN HCL 500 MG PO TABS Oral Take 500 mg by mouth 3 (three) times daily.     [name] NITROGLYCERIN 0.4 MG SL SUBL Sublingual Place 1 tablet (0.4 mg total) under the tongue every 5 (five) minutes as needed for chest pain (1 tablet every 5 minutes No more than 3 total per episode). 25 tablet 12    BP 158/86  Pulse 163  Resp 20  Ht 5\' 3"  (1.6 m)  Wt 148 lb (67.132 kg)  BMI 26.22 kg/m2  SpO2 96%  Physical Exam  Nursing note and vitals  reviewed. Constitutional: She is oriented to person, place, and time. She appears well-developed and well-nourished. No distress.       Awake, alert, nontoxic appearance.  HENT:  Head: Normocephalic and atraumatic.  Eyes: EOM are normal. Pupils are equal, round, and reactive to light. Right eye exhibits no discharge. Left eye exhibits no discharge.  Neck: Normal range of motion. Neck supple. No tracheal deviation present.  Cardiovascular: Normal heart sounds.  Tachycardia present.        tachycardia  Pulmonary/Chest: Effort normal and breath sounds normal. No respiratory distress. She has no wheezes. She exhibits no tenderness.  Abdominal: Soft. Bowel sounds are normal. She exhibits no distension. There is no tenderness. There is no rebound.  Musculoskeletal: Normal range of motion. She exhibits no edema and no tenderness.       No peripheral edema.   Neurological: She is alert and oriented to person, place, and time. No sensory deficit.       Mental status and motor strength appears baseline for patient and situation.  Skin: Skin is warm and dry. No rash noted.  Psychiatric: She has a normal mood and affect. Her behavior is normal.    ED Course  Procedures (including critical care time) Results for orders placed during the hospital encounter of 02/27/12  CBC      Component Value Range   WBC 12.8 (*) 4.0 - 10.5 K/uL   RBC 4.26  3.87 - 5.11 MIL/uL   Hemoglobin 13.2  12.0 - 15.0 g/dL   HCT 38.3  36.0 - 46.0 %   MCV 89.9  78.0 - 100.0 fL   MCH 31.0  26.0 - 34.0 pg   MCHC 34.5  30.0 - 36.0 g/dL   RDW 13.4  11.5 - 15.5 %   Platelets 244  150 - 400 K/uL  TROPONIN I      Component Value Range   Troponin I <0.30  <0.30 ng/mL  COMPREHENSIVE METABOLIC PANEL      Component Value Range   Sodium 128 (*) 135 - 145 mEq/L   Potassium 3.7  3.5 - 5.1 mEq/L   Chloride 94 (*) 96 - 112 mEq/L   CO2 23  19 - 32 mEq/L   Glucose, Bld 318 (*) 70 - 99 mg/dL   BUN 12  6 - 23 mg/dL   Creatinine, Ser  0.82  0.50 - 1.10 mg/dL   Calcium 9.9  8.4 - 10.5 mg/dL   Total Protein 7.1  6.0 - 8.3 g/dL   Albumin 4.2  3.5 - 5.2 g/dL   AST 9  0 - 37 U/L   ALT 14  0 - 35 U/L   Alkaline Phosphatase 86  39 - 117 U/L   Total Bilirubin 0.3  0.3 - 1.2 mg/dL   GFR calc non Af Amer 73 (*) >90 mL/min   GFR calc Af Amer 85 (*) >90 mL/min  DIFFERENTIAL      Component Value Range   Neutrophils Relative 77  43 - 77 %   Neutro Abs 9.6 (*) 1.7 - 7.7 K/uL   Lymphocytes Relative 14  12 - 46 %   Lymphs Abs 1.8  0.7 - 4.0 K/uL   Monocytes Relative 7  3 - 12 %   Monocytes Absolute 0.9  0.1 - 1.0 K/uL   Eosinophils Relative 1  0 - 5 %   Eosinophils Absolute 0.2  0.0 - 0.7 K/uL   Basophils Relative 0  0 - 1 %   Basophils Absolute 0.1  0.0 - 0.1 K/uL   DIAGNOSTIC STUDIES: Oxygen Saturation is 96% on room air, normal by my interpretation.    COORDINATION OF CARE:  23:25-Discussed planned course of treatment with the patient including a chest x-ray, blood work and 2 rounds of cardizem, who is agreeable at this time. Discussed possible need for admission.      Dg Chest Portable 1 View  02/27/2012  *RADIOLOGY REPORT*  Clinical Data: Chest pain  PORTABLE CHEST - 1 VIEW  Comparison: 11/28/2004  Findings: The heart and pulmonary vascularity are within normal limits.  The lungs are free of acute infiltrate or sizable effusion.  No acute bony abnormality is seen.  IMPRESSION: No acute abnormality.   Original Report Authenticated By: Everlene Farrier, M.D.     Date: 02/27/2012   2239  Rate: 156  Rhythm: atrial fibrillation and with a rapid ventricular response  QRS Axis: normal  Intervals: atrial fib  ST/T Wave abnormalities: ST depressions inferiorly and ST depressions laterally  Conduction Disutrbances:none  Narrative Interpretation:   Old EKG Reviewed: changes noted c/w 05/06/2008 atrial fib is new  #2 After Cardizem 20 mg and 25 mg bolus and cardizen drip begun   Date: 02/28/2012   0008  Rate: 83  Rhythm:  normal sinus rhythm  QRS Axis: normal  Intervals: normal  ST/T Wave abnormalities: normal  Conduction Disutrbances:none  Narrative Interpretation:   Old EKG Reviewed: changes noted, conversion to NSR from atrial fib    No diagnosis found.  2320 First dose of Cardizem slowed heart rate to 120s which was not sustained. 2330 Second dose of Cardizem with slowing of hart rate to 118 with rebound to 130's. Will start Cardizem drip. 0005 Cardizem drip begun with immediate conversion to normal sinus rhythm, rate 88. 1:22 AM:  T/C to Dr. Megan Salon, hospitalist case discussed, including:  HPI, pertinent PM/SHx, VS/PE, dx testing, ED course and treatment.  Agreeable to admission for observation. Requests to write temporary orders, telemetry bed.Asked that I stop the Cardizem drip.  MDM  Patient with new onset atrial fibrillation. Converted to NSR after receiving Cardizem bolus x 2 and drip begun. Spoke with Dr. Megan Salon, hospitalist for admission for observation.Pt stable in ED with no significant deterioration in condition.The patient appears reasonably stabilized for admission considering the current resources, flow, and capabilities available in the ED  at this time, and I doubt any other [facility] requiring further screening and/or treatment in the ED prior to admission.  I personally performed the services described in this documentation, which was scribed in my presence. The recorded information has been reviewed and considered.   MDM Reviewed: nursing note, vitals and previous chart Interpretation: labs, ECG and x-ray Total time providing critical care: 40 minutes.           Gypsy Balsam. Olin Hauser, MD 02/28/12 0125  Gypsy Balsam. Olin Hauser, MD 02/28/12 [phone]

## 2012-02-28 ENCOUNTER — Encounter (HOSPITAL_COMMUNITY): Payer: Self-pay | Admitting: Internal Medicine

## 2012-02-28 DIAGNOSIS — E039 Hypothyroidism, unspecified: Secondary | ICD-10-CM | POA: Diagnosis present

## 2012-02-28 DIAGNOSIS — I208 Other forms of angina pectoris: Secondary | ICD-10-CM | POA: Diagnosis present

## 2012-02-28 DIAGNOSIS — I209 Angina pectoris, unspecified: Secondary | ICD-10-CM

## 2012-02-28 DIAGNOSIS — I10 Essential (primary) hypertension: Secondary | ICD-10-CM

## 2012-02-28 DIAGNOSIS — I480 Paroxysmal atrial fibrillation: Secondary | ICD-10-CM | POA: Diagnosis present

## 2012-02-28 LAB — COMPREHENSIVE METABOLIC PANEL
Alkaline Phosphatase: 86 U/L (ref 39–117)
BUN: 12 mg/dL (ref 6–23)
Chloride: 101 mEq/L (ref 96–112)
GFR calc Af Amer: 85 mL/min — ABNORMAL LOW (ref >90)
Glucose, Bld: 318 mg/dL — ABNORMAL HIGH (ref 70–99)
Potassium: 4 mEq/L (ref 3.5–5.1)
Total Bilirubin: 0.3 mg/dL (ref 0.3–1.2)

## 2012-02-28 LAB — GLUCOSE, CAPILLARY
Glucose-Capillary: 243 mg/dL — ABNORMAL HIGH (ref 70–99)
Glucose-Capillary: 244 mg/dL — ABNORMAL HIGH (ref 70–99)

## 2012-02-28 LAB — BASIC METABOLIC PANEL
BUN: 13 mg/dL (ref 6–23)
Calcium: 8.9 mg/dL (ref 8.4–10.5)
Creatinine, Ser: 0.75 mg/dL (ref 0.50–1.10)
GFR calc Af Amer: >90 mL/min (ref >90)
GFR calc non Af Amer: 86 mL/min — ABNORMAL LOW (ref >90)
Potassium: 3.9 mEq/L (ref 3.5–5.1)

## 2012-02-28 LAB — MAGNESIUM: Magnesium: 1.7 mg/dL (ref 1.5–2.5)

## 2012-02-28 LAB — HEPARIN LEVEL (UNFRACTIONATED)
Heparin Unfractionated: <0.1 IU/mL — ABNORMAL LOW (ref 0.30–0.70)
Heparin Unfractionated: 0.1 IU/mL — ABNORMAL LOW (ref 0.30–0.70)

## 2012-02-28 LAB — URINALYSIS, ROUTINE W REFLEX MICROSCOPIC
Bilirubin Urine: NEGATIVE
Ketones, ur: NEGATIVE mg/dL
Nitrite: NEGATIVE
Protein, ur: NEGATIVE mg/dL
Specific Gravity, Urine: 1.01 (ref 1.005–1.030)
Urobilinogen, UA: 0.2 mg/dL (ref 0.0–1.0)

## 2012-02-28 LAB — CBC
MCH: 30.7 pg (ref 26.0–34.0)
MCHC: 33.1 g/dL (ref 30.0–36.0)
Platelets: 195 10*3/uL (ref 150–400)
RDW: 13.1 % (ref 11.5–15.5)

## 2012-02-28 LAB — T4, FREE: Free T4: 1.35 ng/dL (ref 0.80–1.80)

## 2012-02-28 LAB — TROPONIN I
Troponin I: 0.57 ng/mL (ref <0.30)
Troponin I: 0.55 ng/mL (ref <0.30)
Troponin I: <0.3 ng/mL (ref <0.30)

## 2012-02-28 LAB — PROTIME-INR: Prothrombin Time: 13.7 seconds (ref 11.6–15.2)

## 2012-02-28 LAB — TSH: TSH: 2 u[IU]/mL (ref 0.350–4.500)

## 2012-02-28 MED ORDER — POLYETHYLENE GLYCOL 3350 17 G PO PACK: 17.0000 g | PACK | Freq: Every day | ORAL | Status: DC | PRN

## 2012-02-28 MED ORDER — INSULIN ASPART 100 UNIT/ML ~~LOC~~ SOLN
0.0000 [IU] | Freq: Three times a day (TID) | SUBCUTANEOUS | Status: DC
  Administered 2012-02-28 – 2012-02-29 (×3): 7 [IU] via SUBCUTANEOUS
  Administered 2012-02-29 (×2): 4 [IU] via SUBCUTANEOUS
  Administered 2012-03-01 (×2): 3 [IU] via SUBCUTANEOUS
  Administered 2012-03-01 – 2012-03-02 (×3): 4 [IU] via SUBCUTANEOUS
  Administered 2012-03-03: 3 [IU] via SUBCUTANEOUS
  Administered 2012-03-04: 4 [IU] via SUBCUTANEOUS
  Administered 2012-03-04: 7 [IU] via SUBCUTANEOUS
  Administered 2012-03-05 (×2): 3 [IU] via SUBCUTANEOUS
  Administered 2012-03-05 – 2012-03-06 (×2): 4 [IU] via SUBCUTANEOUS
  Administered 2012-03-07: 3 [IU] via SUBCUTANEOUS
  Administered 2012-03-08: 4 [IU] via SUBCUTANEOUS

## 2012-02-28 MED ORDER — ACETAMINOPHEN 325 MG PO TABS
650.0000 mg | ORAL_TABLET | Freq: Four times a day (QID) | ORAL | Status: DC | PRN
  Administered 2012-02-28 – 2012-03-06 (×5): 650 mg via ORAL
  Filled 2012-02-28 (×6): qty 2

## 2012-02-28 MED ORDER — BISACODYL 5 MG PO TBEC: 5.0000 mg | DELAYED_RELEASE_TABLET | Freq: Every day | ORAL | Status: DC | PRN

## 2012-02-28 MED ORDER — LOSARTAN POTASSIUM 50 MG PO TABS
100.0000 mg | ORAL_TABLET | Freq: Every day | ORAL | Status: DC
  Administered 2012-02-28 – 2012-03-08 (×10): 100 mg via ORAL
  Filled 2012-02-28 (×10): qty 2

## 2012-02-28 MED ORDER — SALINE SPRAY 0.65 % NA SOLN
1.0000 | NASAL | Status: DC | PRN
  Filled 2012-02-28: qty 44

## 2012-02-28 MED ORDER — HEPARIN BOLUS VIA INFUSION
2000.0000 [IU] | Freq: Once | INTRAVENOUS | Status: AC
  Administered 2012-02-28: 2000 [IU] via INTRAVENOUS
  Filled 2012-02-28: qty 2000

## 2012-02-28 MED ORDER — SODIUM CHLORIDE 0.9 % IJ SOLN
INTRAMUSCULAR | Status: AC
  Filled 2012-02-28: qty 3

## 2012-02-28 MED ORDER — LABETALOL HCL 200 MG PO TABS
400.0000 mg | ORAL_TABLET | Freq: Two times a day (BID) | ORAL | Status: DC
  Administered 2012-02-28 – 2012-03-08 (×19): 400 mg via ORAL
  Filled 2012-02-28 (×19): qty 2

## 2012-02-28 MED ORDER — ENOXAPARIN SODIUM 40 MG/0.4ML ~~LOC~~ SOLN: 40.0000 mg | SUBCUTANEOUS | Status: DC

## 2012-02-28 MED ORDER — INSULIN ASPART 100 UNIT/ML ~~LOC~~ SOLN
0.0000 [IU] | Freq: Every day | SUBCUTANEOUS | Status: DC
  Administered 2012-02-28: 2 [IU] via SUBCUTANEOUS

## 2012-02-28 MED ORDER — POTASSIUM CHLORIDE IN NACL 20-0.9 MEQ/L-% IV SOLN
INTRAVENOUS | Status: DC
  Administered 2012-02-28 (×2): via INTRAVENOUS

## 2012-02-28 MED ORDER — LEVOTHYROXINE SODIUM 137 MCG PO TABS
137.0000 ug | ORAL_TABLET | Freq: Every day | ORAL | Status: DC
  Administered 2012-02-29 – 2012-03-08 (×9): 137 ug via ORAL
  Filled 2012-02-28 (×11): qty 1

## 2012-02-28 MED ORDER — LABETALOL HCL 300 MG PO TABS
300.0000 mg | ORAL_TABLET | Freq: Once | ORAL | Status: AC
  Administered 2012-02-28: 300 mg via ORAL
  Filled 2012-02-28: qty 1

## 2012-02-28 MED ORDER — FLEET ENEMA 7-19 GM/118ML RE ENEM
1.0000 | ENEMA | Freq: Once | RECTAL | Status: AC | PRN
Start: 2012-02-28 — End: 2012-02-28

## 2012-02-28 MED ORDER — ONDANSETRON HCL 4 MG/2ML IJ SOLN
4.0000 mg | Freq: Four times a day (QID) | INTRAMUSCULAR | Status: DC | PRN
  Administered 2012-02-28: 4 mg via INTRAVENOUS
  Filled 2012-02-28: qty 2

## 2012-02-28 MED ORDER — LABETALOL HCL 200 MG PO TABS
ORAL_TABLET | ORAL | Status: AC
  Filled 2012-02-28: qty 2

## 2012-02-28 MED ORDER — HEPARIN (PORCINE) IN NACL 100-0.45 UNIT/ML-% IJ SOLN
1750.0000 [IU]/h | INTRAMUSCULAR | Status: DC
  Administered 2012-02-28: 1250 [IU]/h via INTRAVENOUS
  Administered 2012-02-28: 950 [IU]/h via INTRAVENOUS
  Administered 2012-02-29: 1250 [IU]/h via INTRAVENOUS
  Filled 2012-02-28 (×3): qty 250

## 2012-02-28 MED ORDER — PANTOPRAZOLE SODIUM 40 MG PO TBEC
40.0000 mg | DELAYED_RELEASE_TABLET | Freq: Every day | ORAL | Status: DC
  Administered 2012-02-28 – 2012-03-01 (×3): 40 mg via ORAL
  Filled 2012-02-28 (×3): qty 1

## 2012-02-28 MED ORDER — HEPARIN BOLUS VIA INFUSION
4000.0000 [IU] | Freq: Once | INTRAVENOUS | Status: AC
  Administered 2012-02-28: 4000 [IU] via INTRAVENOUS
  Filled 2012-02-28: qty 4000

## 2012-02-28 MED ORDER — INSULIN GLARGINE 100 UNIT/ML ~~LOC~~ SOLN
40.0000 [IU] | Freq: Once | SUBCUTANEOUS | Status: AC
  Administered 2012-02-28: 40 [IU] via SUBCUTANEOUS
  Filled 2012-02-28: qty 1

## 2012-02-28 MED ORDER — ASPIRIN EC 325 MG PO TBEC
325.0000 mg | DELAYED_RELEASE_TABLET | Freq: Every day | ORAL | Status: DC
  Administered 2012-02-28 – 2012-02-29 (×2): 325 mg via ORAL
  Filled 2012-02-28 (×2): qty 1

## 2012-02-28 MED ORDER — GUAIFENESIN-DM 100-10 MG/5ML PO SYRP
5.0000 mL | ORAL_SOLUTION | ORAL | Status: DC | PRN
  Administered 2012-02-28 – 2012-03-03 (×3): 5 mL via ORAL
  Filled 2012-02-28 (×3): qty 5

## 2012-02-28 MED ORDER — AMLODIPINE BESYLATE 5 MG PO TABS
10.0000 mg | ORAL_TABLET | Freq: Every day | ORAL | Status: DC
  Administered 2012-02-28 – 2012-02-29 (×2): 10 mg via ORAL
  Filled 2012-02-28 (×2): qty 2

## 2012-02-28 MED ORDER — NITROGLYCERIN 0.4 MG SL SUBL: 0.4000 mg | SUBLINGUAL_TABLET | SUBLINGUAL | Status: DC | PRN

## 2012-02-28 MED ORDER — INSULIN GLARGINE 100 UNIT/ML ~~LOC~~ SOLN
60.0000 [IU] | Freq: Every day | SUBCUTANEOUS | Status: DC
  Administered 2012-02-28: 60 [IU] via SUBCUTANEOUS

## 2012-02-28 MED ORDER — ATORVASTATIN CALCIUM 20 MG PO TABS
20.0000 mg | ORAL_TABLET | Freq: Every day | ORAL | Status: DC
  Administered 2012-02-28 – 2012-03-07 (×9): 20 mg via ORAL
  Filled 2012-02-28 (×9): qty 1

## 2012-02-28 MED ORDER — METFORMIN HCL 500 MG PO TABS
500.0000 mg | ORAL_TABLET | Freq: Three times a day (TID) | ORAL | Status: DC
  Administered 2012-02-28 – 2012-03-08 (×29): 500 mg via ORAL
  Filled 2012-02-28 (×30): qty 1

## 2012-02-28 MED ORDER — TRAZODONE HCL 50 MG PO TABS
25.0000 mg | ORAL_TABLET | Freq: Every evening | ORAL | Status: DC | PRN
  Administered 2012-03-03 – 2012-03-06 (×4): 25 mg via ORAL
  Filled 2012-02-28 (×4): qty 1

## 2012-02-28 NOTE — Progress Notes (Signed)
[place] for Heparin Indication: chest pain/ACS  Allergies  Allergen Reactions  . Penicillins Hives  . Percocet (Oxycodone-Acetaminophen) Nausea And Vomiting    Patient Measurements: Height: 5\' 3"  (160 cm) Weight: 151 lb 9.6 oz (68.765 kg) IBW/kg (Calculated) : 52.4  Heparin Dosing Weight: 68  Vital Signs: Pulse Rate: 68  (09/08 0739)  Labs:  Basename 02/28/12 1434 02/28/12 0823 02/28/12 0822 02/28/12 0501 02/27/12 2245  HGB -- -- -- 11.1* 13.2  HCT -- -- -- 33.5* 38.3  PLT -- -- -- 195 244  APTT -- -- -- -- --  LABPROT -- -- 13.7 -- --  INR -- -- 1.03 -- --  HEPARINUNFRC <0.10* -- -- -- --  CREATININE -- -- -- 0.75 0.82  CKTOTAL -- -- -- -- --  CKMB -- -- -- -- --  TROPONINI -- 0.55* -- 0.57* <0.30    Estimated Creatinine Clearance: 64.4 ml/min (by C-G formula based on Cr of 0.75).   Medications:  Scheduled:    . amLODipine  10 mg Oral Daily  . aspirin  325 mg Oral Daily  . atorvastatin  20 mg Oral q1800  . diltiazem (CARDIZEM) infusion  5-15 mg/hr Intravenous Once  . diltiazem  20 mg Intravenous Once  . diltiazem  25 mg Intravenous Once  . heparin  2,000 Units Intravenous Once  . heparin  4,000 Units Intravenous Once  . insulin aspart  0-20 Units Subcutaneous TID WC  . insulin aspart  0-5 Units Subcutaneous QHS  . insulin glargine  40 Units Subcutaneous Once  . insulin glargine  60 Units Subcutaneous QHS  . labetalol  300 mg Oral Once  . labetalol  400 mg Oral BID  . levothyroxine  137 mcg Oral QAC breakfast  . losartan  100 mg Oral Daily  . metFORMIN  500 mg Oral TID WC  . pantoprazole  40 mg Oral Q1200  . DISCONTD: enoxaparin (LOVENOX) injection  40 mg Subcutaneous Q24H  . DISCONTD: metoprolol  10 mg Intravenous Once    Assessment: Okay for Protocol 66 yo female with resent Afib and elevated Troponin X 2. Heparin level below goal.  Goal of Therapy:  Heparin level 0.3-0.7 units/ml Monitor platelets by  anticoagulation protocol: Yes   Plan:  Heparin Bolus 2000 units x 1, then increase drip to 1100 unit/hr. Repeat Hep Level in 6 hours.  Pricilla Larsson 02/28/2012,3:48 PM

## 2012-02-28 NOTE — Progress Notes (Addendum)
CRITICAL VALUE ALERT  Critical value received:  Troponin 0.57  Date of notification: 02/28/12  Time of notification: 0610  Critical value read back:yes  Nurse who received alert: Charna Busman, RN  MD notified (1st page):  Dr. Megan Salon  Time of first page:  0610  MD notified (2nd page):  Time of second page:  Responding MD:  Dr. Megan Salon  Time MD [phone]  Dr. Megan Salon on floor in room with patient at this time

## 2012-02-28 NOTE — Progress Notes (Signed)
Pharmacy Note: Heparin level still below goal at 0.1.  Infusing properly with complications per RN.  Plan: Increase to 1250 units/hr. F/U AM labs. Pricilla Larsson, PHARMD.d 02/28/2012 10:20 PM

## 2012-02-28 NOTE — H&P (Signed)
[facility] History and Physical  Virginia Huber  [id number]  DOB: July 31, 1945   DOA: 02/28/2012   PCP:   Purvis Kilts, MD  Cardiologist: Dr. Jenell Milliner M.D. Endocrinologist: Dr. Chalmers Cater M.D.   Chief Complaint:  Chest pain last night    HPI: Virginia Huber is an 66 y.o. female.   Middle-aged lady with diabetes, hypothyroidism, nonobstructive coronary artery disease, and chronic stable angina, relieved by when necessary nitroglycerin. She does 30 minutes on the treadmill every morning.  Patient has been out all day with her husband attending grandchildren's ballgames, a total of 3 games, and after getting in late dressed for bed, then developed severe central pressing chest pain radiating down her left arm, not relieved by sublingual. She eventually presented to the emergency room where she was found to be in atrial fibrillation with a rate of about 160, given a bolus of IV Cardizem started on a Cardizem drip. The atrial fibrillation and rapidly converted back to sinus rhythm, and the hospitalist service was called to assist with management.  The chest pain associated with atrial fibrillation accompanied by, but she denies diaphoresis nausea or vomiting. Patient has not yet taken her bedtime medications including her lebatalol.  Over the past months patient has been having her labetalol adjusted by her cardiologist, and her Synthroid. According to the patient her Synthroid level was found to be too high at 137 mcg, adjusted downward toe 100 mcg, and with repeated blood testing gradually upwardly adjusted back to 137 mcg. In the meantime her labetalol was titrated up  from 300 mg bid, to 600 mg bid, then because of fatigue and bradycardia it was dropped back in needed to to 300 mg twice a day in May.  There is no history of alcohol use, he has no new risk factors for DVT and has had no calf pain or legs. She has a past history of episodic palpitations but she says never like  this evening; and in fact did not notice any palpitations this evening  Morning blood sugars are usually in the 150s.  Rewiew of Systems:   All systems negative except as marked bold or noted in the HPI;  Constitutional: Negative for malaise, fever and chills. ;  Eyes: Negative for eye pain, redness and discharge. ;  ENMT: Negative for ear pain, hoarseness, nasal congestion, sinus pressure and sore throat. ;  Cardiovascular: Negative for  diaphoresis, dyspnea and peripheral edema. ;  Respiratory: Negative for cough, hemoptysis, wheezing and stridor. ;  Gastrointestinal: Negative for nausea, vomiting, diarrhea, constipation, abdominal pain, melena, blood in stool, hematemesis, jaundice and rectal bleeding. unusual weight loss..   Genitourinary: Negative for frequency, dysuria, incontinence,flank pain and hematuria; Musculoskeletal: Negative for back pain and neck pain. Negative for swelling and trauma.;  Skin: . Negative for pruritus, rash, abrasions, bruising and skin lesion.; ulcerations Neuro: Negative for headache,and neck stiffness. Negative for weakness, altered level of consciousness , altered mental status, extremity weakness, burning feet, involuntary movement, seizure and syncope.  Psych: negative for anxiety, depression, insomnia, tearfulness, panic attacks, hallucinations, paranoia, suicidal or homicidal ideation    Past Medical History  Diagnosis Date  . HTN (hypertension)   . DM (diabetes mellitus)   . Heart block   . Hypothyroidism   . Ovarian tumor   . Lupus   . S/P colonoscopy 2005    Dr. Laural Golden: pancolonic divericula, polyp, path unknown currently  . Coronary artery disease     Past Surgical History  Procedure Date  .  Cholecystectomy   . Colostomy     then reversed  . Partial hysterectomy     left ovaries, then ovaries removed later due tumors   . Oophorectomy     1980, nicked bowel, peritonitis, colostomy, now colostomy reversed   . Eye surgeries   .  Cardiac catheterization     Medications:  HOME MEDS: Prior to Admission medications   Medication Sig Start Date End Date Taking? Authorizing Provider  amLODipine (NORVASC) 10 MG tablet Take 1 tablet (10 mg total) by mouth daily. 02/09/12   Renella Cunas, MD  aspirin 325 MG EC tablet Take 325 mg by mouth daily.      Historical Provider, MD  atorvastatin (LIPITOR) 40 MG tablet Take 20 mg by mouth daily.    Historical Provider, MD  Cholecalciferol (VITAMIN D) 2000 UNITS tablet Take 2,000 Units by mouth daily.      Historical Provider, MD  insulin glargine (LANTUS) 100 UNIT/ML injection Inject 60 Units into the skin at bedtime.     Historical Provider, MD  insulin lispro (HUMALOG) 100 UNIT/ML injection Inject into the skin as directed. SLIDING SCALE    Historical Provider, MD  labetalol (NORMODYNE) 300 MG tablet Take 1 tablet (300 mg total) by mouth 2 (two) times daily. Dose increase 11/02/11   Renella Cunas, MD  levothyroxine (SYNTHROID, LEVOTHROID) 137 MCG tablet Take 137 mcg by mouth daily.    Historical Provider, MD  losartan (COZAAR) 100 MG tablet TAKE ONE (1) TABLET EACH DAY 07/23/11   Renella Cunas, MD  metFORMIN (GLUCOPHAGE) 500 MG tablet Take 500 mg by mouth 3 (three) times daily.     Historical Provider, MD  nitroGLYCERIN (NITROSTAT) 0.4 MG SL tablet Place 1 tablet (0.4 mg total) under the tongue every 5 (five) minutes as needed for chest pain (1 tablet every 5 minutes No more than 3 total per episode). 11/26/11 11/25/12  Renella Cunas, MD     Allergies:  Allergies  Allergen Reactions  . Penicillins   . Percocet (Oxycodone-Acetaminophen) Nausea And Vomiting    Social History:   reports that she has never smoked. She does not have any smokeless tobacco history on file. She reports that she does not drink alcohol or use illicit drugs.  Family History: Family History  Problem Relation Age of Onset  . Heart disease Mother     deceased  . Heart disease Father     deceased, heart  disease  . Colon cancer Neg Hx      Physical Exam: Filed Vitals:   02/28/12 0130 02/28/12 0145 02/28/12 0200 02/28/12 0227  BP: 149/60 149/55 141/57 136/52  Pulse: 75 78 73 73  Resp: 28 22 25    Height:      Weight:      SpO2: 98% 98% 98%    Blood pressure 136/52, pulse 73, resp. rate 25, height 5\' 3"  (1.6 m), weight 67.132 kg (148 lb), SpO2 98.00%.  GEN:  Pleasant Caucasian lady lying in the stretcher in no acute distress; cooperative with exam PSYCH:  alert and oriented x4; does not appear anxious or depressed; affect is appropriate. HEENT: Mucous membranes pink and anicteric; PERRLA; EOM intact; no cervical lymphadenopathy nor thyromegaly or carotid bruit; no JVD; Breasts:: Not examined CHEST WALL: No tenderness CHEST: Normal respiration, clear to auscultation bilaterally HEART: Regular rate and rhythm; no murmurs rubs or gallops BACK: No kyphosis mild scoliosis; no CVA tenderness ABDOMEN: Obese, soft non-tender; no masses, no organomegaly, normal abdominal bowel sounds;  no intertriginous candida. Rectal Exam: Not done EXTREMITIES: ; age-appropriate arthropathy of the hands and knees; no edema; no ulcerations. No calf tenderness Genitalia: not examined PULSES: 2+ and symmetric SKIN: Normal hydration no rash or ulceration CNS: Cranial nerves 2-12 grossly intact no focal lateralizing neurologic deficit   Labs on Admission:  Basic Metabolic Panel:  Lab [id number] 2245  NA 128*  K 3.7  CL 94*  CO2 23  GLUCOSE 318*  BUN 12  CREATININE 0.82  CALCIUM 9.9  MG --  PHOS --   Liver Function Tests:  Lab 02/27/12 2245  AST 9  ALT 14  ALKPHOS 86  BILITOT 0.3  PROT 7.1  ALBUMIN 4.2   No results found for this basename: LIPASE:5,AMYLASE:5 in the last 168 hours No results found for this basename: AMMONIA:5 in the last 168 hours CBC:  Lab 02/27/12 2245  WBC 12.8*  NEUTROABS 9.6*  HGB 13.2  HCT 38.3  MCV 89.9  PLT 244   Cardiac Enzymes:  Lab 02/27/12 2245    CKTOTAL --  CKMB --  CKMBINDEX --  TROPONINI <0.30   BNP: No components found with this basename: POCBNP:5 D-dimer: No components found with this basename: D-DIMER:5 CBG: No results found for this basename: GLUCAP:5 in the last 168 hours  Results for orders placed during the hospital encounter of 02/27/12 (from the past 48 hour(s))  CBC     Status: Abnormal   Collection Time   02/27/12 10:45 PM      Component Value Range Comment   WBC 12.8 (*) 4.0 - 10.5 K/uL    RBC 4.26  3.87 - 5.11 MIL/uL    Hemoglobin 13.2  12.0 - 15.0 g/dL    HCT 38.3  36.0 - 46.0 %    MCV 89.9  78.0 - 100.0 fL    MCH 31.0  26.0 - 34.0 pg    MCHC 34.5  30.0 - 36.0 g/dL    RDW 13.4  11.5 - 15.5 %    Platelets 244  150 - 400 K/uL   TROPONIN I     Status: Normal   Collection Time   02/27/12 10:45 PM      Component Value Range Comment   Troponin I <0.30  <0.30 ng/mL   COMPREHENSIVE METABOLIC PANEL     Status: Abnormal   Collection Time   02/27/12 10:45 PM      Component Value Range Comment   Sodium 128 (*) 135 - 145 mEq/L    Potassium 3.7  3.5 - 5.1 mEq/L    Chloride 94 (*) 96 - 112 mEq/L    CO2 23  19 - 32 mEq/L    Glucose, Bld 318 (*) 70 - 99 mg/dL    BUN 12  6 - 23 mg/dL    Creatinine, Ser 0.82  0.50 - 1.10 mg/dL    Calcium 9.9  8.4 - 10.5 mg/dL    Total Protein 7.1  6.0 - 8.3 g/dL    Albumin 4.2  3.5 - 5.2 g/dL    AST 9  0 - 37 U/L    ALT 14  0 - 35 U/L    Alkaline Phosphatase 86  39 - 117 U/L    Total Bilirubin 0.3  0.3 - 1.2 mg/dL    GFR calc non Af Amer 73 (*) >90 mL/min    GFR calc Af Amer 85 (*) >90 mL/min   DIFFERENTIAL     Status: Abnormal   Collection Time   02/27/12 10:45  PM      Component Value Range Comment   Neutrophils Relative 77  43 - 77 %    Neutro Abs 9.6 (*) 1.7 - 7.7 K/uL    Lymphocytes Relative 14  12 - 46 %    Lymphs Abs 1.8  0.7 - 4.0 K/uL    Monocytes Relative 7  3 - 12 %    Monocytes Absolute 0.9  0.1 - 1.0 K/uL    Eosinophils Relative 1  0 - 5 %    Eosinophils  Absolute 0.2  0.0 - 0.7 K/uL    Basophils Relative 0  0 - 1 %    Basophils Absolute 0.1  0.0 - 0.1 K/uL      Radiological Exams on Admission: Dg Chest Portable 1 View  02/27/2012  *RADIOLOGY REPORT*  Clinical Data: Chest pain  PORTABLE CHEST - 1 VIEW  Comparison: 11/28/2004  Findings: The heart and pulmonary vascularity are within normal limits.  The lungs are free of acute infiltrate or sizable effusion.  No acute bony abnormality is seen.  IMPRESSION: No acute abnormality.   Original Report Authenticated By: Everlene Farrier, M.D.     EKG: Independently reviewed. After conversion of atrial fibrillation EKG shows normal sinus rhythm with a rate of 83, left ventricular hypertrophy with repolarization abnormality; no acute ST segment changes compared to old EKG   Assessment/Plan Present on Admission:  Marland KitchenMarland KitchenAtrial fib/flutter, transient .HYPERTENSION, UNSPECIFIED .Hypothyroid Hyponatremia  .CAD, NATIVE VESSEL .Stable angina HYPERLIPIDEMIA-MIXED  .OVERWEIGHT/OBESITY   PLAN: Will discontinue Cardizem drip, and hydrate with normal saline, and since hyponatremia may be caused by she may be somewhat dehydrated because of a long day out in the sun a ball games. Will give her evening dose of labetalol now, and restart her labetalol in the morning at a higher dose of 400 mg twice daily. Her blood pressure appears to be able to tolerate, but we may need to downwardly adjust her amlodipine.  We'll check her TSH and free T4 to be sure the Synthroid dose remains appropriate.  Other plans as per orders.  Code Status: Full Family Communication: Husband present throughout the interview and exam Disposition Plan:  If remains stable, discharge home later today to followup with her cardiologist and endocrinologist.   Virginia Huber Nocturnist [facility] Pager [phone]  02/28/2012, 2:40 AM

## 2012-02-28 NOTE — Progress Notes (Addendum)
  Virginia Huber [id number] DOB: Jul 09, 1945 DOA: 02/28/2012  02/28/2012 6:45 AM  Called on Patient because serum troponin has risen to 0.57 from 0-6 hours Patient remains asymptomatic EKG remains essentially unchanged  Assessment/plan: Likely NSTEMI, Type 2, due to demand ischemia from afib c RVR. Also possible A. fib was triggered by MI. 11 AM troponin will give a clearer picture.  In any event, our plan will be to anticoagulate with heparin drip, continue beta blockers and ARB Since patient remains pain-free, will continue when necessary sublingual nitroglycerin. Await results of thyroid function test, and 11 AM troponin  Cardiology consult later this morning.  Virginia Huber 02/28/2012 6:51 AM   Lab 02/28/12 0501 02/27/12 2245  NA 135 128*  K 3.8 3.7  CL 97 94*  CO2 23 23  GLUCOSE 315* 318*  BUN 13 12  CREATININE 0.75 0.82  CALCIUM 8.9 9.9  MG 1.7 --  PHOS -- --    CBC:  Lab 02/28/12 0501 02/27/12 2245  WBC 9.9 12.8*  NEUTROABS -- 9.6*  HGB 11.1* 13.2  HCT 33.5* 38.3  MCV 92.5 89.9  PLT 195 244   Cardiac Enzymes:  Lab 02/28/12 0501 02/27/12 2245  CKTOTAL -- --  CKMB -- --  CKMBINDEX -- --  TROPONINI 0.57* <0.30    Virginia Huber  [facility]  02/28/2012, 6:40 AM  LOS: 1 day

## 2012-02-28 NOTE — ED Notes (Addendum)
Report to floor

## 2012-02-28 NOTE — Progress Notes (Signed)
The patient is a 66 year old woman who was admitted this morning for chest pain. She has a history of nonobstructive coronary artery disease per cardiac catheterization in 2008 which revealed a tiny OM-2 branch with 90% narrowing. This area could not be intervened. She also had a nuclear medicine stress test in May 2013 which revealed a small region of mild ischemia inferiorly apically. She was noted to have newly diagnosed atrial fibrillation with a heart rate ranging from 130s to the 160s on arrival to the emergency department this morning. Following the diltiazem bolus and drip, her rhythm converted to normal sinus rhythm and her rate became controlled. She has been in normal sinus rhythm for the last 3 hours. Her followup EKG revealed normal sinus rhythm with stable ST and T-wave abnormalities in the inferior lateral leads and new T-wave inversions. Her troponin I was initially within normal limits, but it increased to 0.57. Followup troponin I is 0.55. She is currently chest pain-free. I discussed the findings with cardiologist on call, Dr. Rayann Heman. He recommended ongoing medical treatment here at [facility]. He agreed with continuing IV heparin until the patient is evaluated by cardiology in the morning. Starting one of the newer anticoagulants should be considered per Dr. Rayann Heman.  Protonix will be started because of the patient's mild anemia and while she is on heparin. Sliding scale NovoLog and Synthroid was restarted this morning. Her capillary blood glucose will be monitored before each meal and at bedtime. TSH and free T4 pending. Will order a fasting lipid panel in the morning. Will order a 2-D echocardiogram and consult cardiology in the morning.

## 2012-02-28 NOTE — Progress Notes (Signed)
ANTICOAGULATION CONSULT NOTE - Initial Consult  Pharmacy Consult for Heparin Indication: chest pain/ACS  Allergies  Allergen Reactions  . Penicillins Hives  . Percocet (Oxycodone-Acetaminophen) Nausea And Vomiting    Patient Measurements: Height: 5\' 3"  (160 cm) Weight: 151 lb 9.6 oz (68.765 kg) IBW/kg (Calculated) : 52.4  Heparin Dosing Weight: 68  Vital Signs: Temp: 98.6 F (37 C) (09/08 0250) Temp src: Oral (09/08 0250) BP: 146/77 mmHg (09/08 0250) Pulse Rate: 73  (09/08 0250)  Labs:  Basename 02/28/12 0501 02/27/12 2245  HGB 11.1* 13.2  HCT 33.5* 38.3  PLT 195 244  APTT -- --  LABPROT -- --  INR -- --  HEPARINUNFRC -- --  CREATININE 0.75 0.82  CKTOTAL -- --  CKMB -- --  TROPONINI 0.57* <0.30    Estimated Creatinine Clearance: 64.4 ml/min (by C-G formula based on Cr of 0.75).   Medical History: Past Medical History  Diagnosis Date  . HTN (hypertension)   . DM (diabetes mellitus)   . Heart block   . Hypothyroidism   . Ovarian tumor   . Lupus   . S/P colonoscopy 2005    Dr. Laural Golden: pancolonic divericula, polyp, path unknown currently  . Coronary artery disease     Medications:  Scheduled:    . amLODipine  10 mg Oral Daily  . aspirin  325 mg Oral Daily  . atorvastatin  20 mg Oral q1800  . diltiazem (CARDIZEM) infusion  5-15 mg/hr Intravenous Once  . diltiazem  20 mg Intravenous Once  . diltiazem  25 mg Intravenous Once  . enoxaparin (LOVENOX) injection  40 mg Subcutaneous Q24H  . insulin glargine  40 Units Subcutaneous Once  . insulin glargine  60 Units Subcutaneous QHS  . labetalol  300 mg Oral Once  . labetalol  400 mg Oral BID  . losartan  100 mg Oral Daily  . metFORMIN  500 mg Oral TID WC  . DISCONTD: metoprolol  10 mg Intravenous Once    Assessment: Okay for Protocol 66 yo female with resent Afib and elevated Troponin. Coag labs pending.  Goal of Therapy:  Heparin level 0.3-0.7 units/ml Monitor platelets by anticoagulation  protocol: Yes   Plan:  Give 4000 units bolus x 1 Start heparin infusion at 950 units/hr Check anti-Xa level in 6-8 hours and daily while on heparin Continue to monitor H&H and platelets D/C Lovenox  Pricilla Larsson 02/28/2012,7:29 AM

## 2012-02-29 ENCOUNTER — Inpatient Hospital Stay (HOSPITAL_COMMUNITY): Payer: Medicare Other

## 2012-02-29 ENCOUNTER — Telehealth (HOSPITAL_COMMUNITY): Payer: Self-pay | Admitting: Dietician

## 2012-02-29 DIAGNOSIS — I4891 Unspecified atrial fibrillation: Principal | ICD-10-CM

## 2012-02-29 DIAGNOSIS — E1159 Type 2 diabetes mellitus with other circulatory complications: Secondary | ICD-10-CM | POA: Diagnosis present

## 2012-02-29 DIAGNOSIS — E1121 Type 2 diabetes mellitus with diabetic nephropathy: Secondary | ICD-10-CM

## 2012-02-29 DIAGNOSIS — J40 Bronchitis, not specified as acute or chronic: Secondary | ICD-10-CM | POA: Diagnosis present

## 2012-02-29 DIAGNOSIS — R7989 Other specified abnormal findings of blood chemistry: Secondary | ICD-10-CM

## 2012-02-29 DIAGNOSIS — I214 Non-ST elevation (NSTEMI) myocardial infarction: Secondary | ICD-10-CM | POA: Diagnosis not present

## 2012-02-29 DIAGNOSIS — I219 Acute myocardial infarction, unspecified: Secondary | ICD-10-CM

## 2012-02-29 DIAGNOSIS — R079 Chest pain, unspecified: Secondary | ICD-10-CM

## 2012-02-29 DIAGNOSIS — I517 Cardiomegaly: Secondary | ICD-10-CM

## 2012-02-29 HISTORY — DX: Type 2 diabetes mellitus with diabetic nephropathy: E11.21

## 2012-02-29 LAB — CBC
MCH: 30.8 pg (ref 26.0–34.0)
MCHC: 33.5 g/dL (ref 30.0–36.0)
Platelets: 214 10*3/uL (ref 150–400)
RBC: 3.64 MIL/uL — ABNORMAL LOW (ref 3.87–5.11)

## 2012-02-29 LAB — LIPID PANEL
Cholesterol: 127 mg/dL (ref 0–200)
HDL: 25 mg/dL — ABNORMAL LOW (ref >39)

## 2012-02-29 LAB — GLUCOSE, CAPILLARY
Glucose-Capillary: 250 mg/dL — ABNORMAL HIGH (ref 70–99)
Glucose-Capillary: 171 mg/dL — ABNORMAL HIGH (ref 70–99)

## 2012-02-29 LAB — TROPONIN I: Troponin I: <0.3 ng/mL (ref <0.30)

## 2012-02-29 LAB — BASIC METABOLIC PANEL
CO2: 23 mEq/L (ref 19–32)
Chloride: 104 mEq/L (ref 96–112)
GFR calc Af Amer: 83 mL/min — ABNORMAL LOW (ref >90)
Potassium: 4.3 mEq/L (ref 3.5–5.1)

## 2012-02-29 LAB — BLOOD GAS, ARTERIAL
Bicarbonate: 20.8 mEq/L (ref 20.0–24.0)
O2 Saturation: 88 %
pO2, Arterial: 51.9 mmHg — ABNORMAL LOW (ref 80.0–100.0)

## 2012-02-29 LAB — HEMOGLOBIN A1C
Hgb A1c MFr Bld: 8.5 % — ABNORMAL HIGH (ref <5.7)
Mean Plasma Glucose: 197 mg/dL — ABNORMAL HIGH (ref <117)

## 2012-02-29 MED ORDER — DILTIAZEM HCL 60 MG PO TABS
60.0000 mg | ORAL_TABLET | Freq: Three times a day (TID) | ORAL | Status: DC
  Administered 2012-02-29 – 2012-03-08 (×23): 60 mg via ORAL
  Filled 2012-02-29 (×23): qty 1

## 2012-02-29 MED ORDER — MOXIFLOXACIN HCL IN NACL 400 MG/250ML IV SOLN
400.0000 mg | INTRAVENOUS | Status: DC
  Administered 2012-02-29: 400 mg via INTRAVENOUS
  Filled 2012-02-29 (×2): qty 250

## 2012-02-29 MED ORDER — INSULIN GLARGINE 100 UNIT/ML ~~LOC~~ SOLN
65.0000 [IU] | Freq: Every day | SUBCUTANEOUS | Status: DC
  Administered 2012-02-29 – 2012-03-07 (×8): 65 [IU] via SUBCUTANEOUS

## 2012-02-29 MED ORDER — SODIUM CHLORIDE 0.9 % IJ SOLN
INTRAMUSCULAR | Status: AC
  Administered 2012-02-29: 19:00:00
  Filled 2012-02-29: qty 3

## 2012-02-29 MED ORDER — HEPARIN BOLUS VIA INFUSION
2000.0000 [IU] | Freq: Once | INTRAVENOUS | Status: AC
  Administered 2012-02-29: 2000 [IU] via INTRAVENOUS
  Filled 2012-02-29: qty 2000

## 2012-02-29 MED ORDER — AZITHROMYCIN 250 MG PO TABS: 250.0000 mg | ORAL_TABLET | Freq: Every day | ORAL | Status: DC

## 2012-02-29 MED ORDER — OMEGA-3-ACID ETHYL ESTERS 1 G PO CAPS
1.0000 g | ORAL_CAPSULE | Freq: Two times a day (BID) | ORAL | Status: DC
  Administered 2012-02-29 – 2012-03-08 (×16): 1 g via ORAL
  Filled 2012-02-29 (×17): qty 1

## 2012-02-29 MED ORDER — HEPARIN SODIUM (PORCINE) 5000 UNIT/ML IJ SOLN
5000.0000 [IU] | Freq: Three times a day (TID) | INTRAMUSCULAR | Status: DC
  Administered 2012-02-29 – 2012-03-08 (×23): 5000 [IU] via SUBCUTANEOUS
  Filled 2012-02-29 (×18): qty 1
  Filled 2012-02-29: qty 2
  Filled 2012-02-29 (×4): qty 1

## 2012-02-29 MED ORDER — MOXIFLOXACIN HCL IN NACL 400 MG/250ML IV SOLN
INTRAVENOUS | Status: AC
  Filled 2012-02-29: qty 250

## 2012-02-29 MED ORDER — AZITHROMYCIN 250 MG PO TABS
500.0000 mg | ORAL_TABLET | Freq: Every day | ORAL | Status: AC
  Administered 2012-02-29: 500 mg via ORAL
  Filled 2012-02-29: qty 2

## 2012-02-29 MED ORDER — FUROSEMIDE 10 MG/ML IJ SOLN
20.0000 mg | Freq: Once | INTRAMUSCULAR | Status: AC
  Administered 2012-02-29: 20 mg via INTRAVENOUS
  Filled 2012-02-29: qty 2

## 2012-02-29 NOTE — Telephone Encounter (Signed)
Spoke with Courtney Heys, Inpatient Diabetes Coordinator, on 02/29/12 at 9:30 AM and informed about referral re: outpatient diabetes education. Reports Hgb A1c in the 8 range. Pt is followed by Dr. Chalmers Cater.

## 2012-02-29 NOTE — Consult Note (Signed)
CARDIOLOGY CONSULT NOTE  Patient ID: Virginia Huber MRN: [id number] DOB/AGE: 1945-07-24 66 y.o.  Admit date: 02/27/2012 Referring Physician: PTH Rama Primary 79 CABOT, MD Primary Cardiologist: Jenell Milliner Reason for Consultation: PAF  Principal Problem:  *Atrial fib/flutter, transient Active Problems:  HYPERLIPIDEMIA-MIXED  CAD, NATIVE VESSEL  OVERWEIGHT/OBESITY  HYPERTENSION, UNSPECIFIED  Stable angina  Hypothyroid  Elevated troponin I level  DM (diabetes mellitus), type 2, uncontrolled  Bronchitis  HPI: Virginia Huber is a 66 year old female patient that we follow for history of single vessel CAD, chronic angina, diabetes and hypothyroidism. She was most recently seen by Dr. Verl Blalock in the office on 02/09/2012 and was doing well, noting only occasional episodes of chest discomfort. `Repeat stress test vs. cardiac catheterization considered at that time, but continue medical therapy without additional testing chosen, as she had a low risk nuclear medicine study on 11/24/2011.     On the day of admission the day of admission she was getting ready for bed and had sudden onset of chest discomfort and shortness of breath. He had an extremely busy day that day and was very tired. She states that over the last 3 days prior to this admission she was also having some bronchitis-like symptoms with cough and congestion. She took a nitroglycerin, and aspirin, and rested but symptoms did not subside. Her husband brought her to the emergency room. In the emergency room the patient was found to be in A. fib with RVR, 163 beats per minute. Pressure was 150/86. Blood glucose was found to be elevated at 318, blood cells were elevated at 12.8, TSH 2.0; troponin was found to be 0.57, and 0.55 respectively after admission troponin of less than 0.30.[name] X-ray revealed no acute abnormality. The patient was given a 20 mg dose of diltiazem x1 with a second dose 10 minutes later, started on a Cardizem  drip at 5 mg an hour. With slowing of heart rate, chest discomfort was relieved. She was also started on heparin, insulin and azithromycin 500 mg daily.    The patient is currently admitted on telemetry in normal sinus rhythm with episodes of sinus tachycardia. There has been no recurrence of atrial fibrillation. We are asked for cardiac recommendations. Echocardiogram dated 06/02/2008 revealed an EF of 60% with no wall motion abnormalities and some left atrium dilatation.  Review of systems complete and found to be negative unless listed above   Past Medical History  Diagnosis Date  . HTN (hypertension)   . DM (diabetes mellitus)   . Heart block   . Hypothyroidism   . Ovarian tumor   . Lupus   . S/P colonoscopy 2005    Dr. Laural Golden: pancolonic divericula, polyp, path unknown currently  . Coronary artery disease     Family History  Problem Relation Age of Onset  . Heart disease Mother     deceased  . Heart disease Father     deceased, heart disease  . Colon cancer Neg Hx     History   Social History  . Marital Status: Married    Spouse Name: N/A    Number of Children: N/A  . Years of Education: N/A   Occupational History  . Retired     Research officer, political party   Social History Main Topics  . Smoking status: Never Smoker   . Smokeless tobacco: Not on file  . Alcohol Use: No  . Drug Use: No  . Sexually Active: Not on file   Other Topics Concern  . Not on  file   Social History Narrative  . No narrative on file    Past Surgical History  Procedure Date  . Cholecystectomy   . Colostomy     then reversed  . Partial hysterectomy     left ovaries, then ovaries removed later due tumors   . Oophorectomy     1980, nicked bowel, peritonitis, colostomy, now colostomy reversed   . Eye surgeries   . Cardiac catheterization 2008    Tiny OM-2 with 90% narrowing. Med tx.  . Nuclear med stress test 10/2011    Small area of mild ischemia inferoapically.    Prescriptions prior to  admission  Medication Sig Dispense Refill  . amLODipine (NORVASC) 10 MG tablet Take 1 tablet (10 mg total) by mouth daily.  30 tablet  11  . aspirin 325 MG EC tablet Take 325 mg by mouth daily.        [name] atorvastatin (LIPITOR) 40 MG tablet Take 20 mg by mouth daily.      . Cholecalciferol (VITAMIN D) 2000 UNITS tablet Take 2,000 Units by mouth daily.        . fexofenadine (ALLEGRA) 180 MG tablet Take 180 mg by mouth daily as needed. Allergies      . insulin glargine (LANTUS) 100 UNIT/ML injection Inject 60 Units into the skin at bedtime.       . insulin lispro (HUMALOG) 100 UNIT/ML injection Inject 12-14 Units into the skin 3 (three) times daily before meals. Uses according to sliding scale at home.      . labetalol (NORMODYNE) 300 MG tablet Take 1 tablet (300 mg total) by mouth 2 (two) times daily. Dose increase  60 tablet  12  . levothyroxine (SYNTHROID, LEVOTHROID) 137 MCG tablet Take 137 mcg by mouth daily.      [name] losartan (COZAAR) 100 MG tablet Take 100 mg by mouth daily.      . metFORMIN (GLUCOPHAGE) 500 MG tablet Take 500-1,000 mg by mouth 2 (two) times daily with a meal. Takes 2 tablets in the morning and 1 at night.      . nitroGLYCERIN (NITROSTAT) 0.4 MG SL tablet Place 1 tablet (0.4 mg total) under the tongue every 5 (five) minutes as needed for chest pain (1 tablet every 5 minutes No more than 3 total per episode).  25 tablet  12   Physical Exam: Blood pressure 96/60, pulse 79, temperature 98.6 F (37 C), temperature source Oral, resp. rate 20, height 5\' 3"  (1.6 m), weight 154 lb (69.854 kg), SpO2 91.00%.  General: Well developed, well nourished, in no acute distress Head: Eyes PERRLA, No xanthomas.   Normal cephalic and atramatic  Lungs: Crackles noted in the right lung base. With upper airway wheezing anteriorly, no rales or rhonchi however. Coughing with inspiration but nonproductive Heart: HRRR S1 S2, 1/6 systolic murmur.  Pulses are 2+ & equal.            No carotid bruit. No  JVD.  No abdominal bruits. No femoral bruits. Abdomen: Bowel sounds are positive, abdomen soft and non-tender without masses Msk:  Back normal, normal gait. Normal strength and tone for age. Extremities: No clubbing, cyanosis or edema.  DP +1 Neuro: Alert and oriented X 3. Psych:  Good affect, responds appropriately   Labs:   Lab Results  Component Value Date   WBC 15.2* 02/29/2012   HGB 11.2* 02/29/2012   HCT 33.4* 02/29/2012   MCV 91.8 02/29/2012   PLT 214 02/29/2012    Lab  02/29/12 0506 02/27/12 2245  NA 135 --  K 4.3 --  CL 104 --  CO2 23 --  BUN 16 --  CREATININE 0.83 --  CALCIUM 8.3* --  PROT -- 7.1  BILITOT -- 0.3  ALKPHOS -- 86  ALT -- 14  AST -- 9  GLUCOSE 267* --   Lab Results  Component Value Date   TROPONINI <0.30 02/29/2012   TSH-2.0   Radiology: CXR: 02/28/12-no significant abnormality.  EKG:  Atrial fibrillation with rapid ventricular response rate of 156 bpm Minimal voltage criteria for LVH; marked ST abnormality, possible inferolateral subendocardial injury or LVH. After conversion to sinus rhythm, ST-T wave abnormalities were improved and restricted to the inferolateral leads  ASSESSMENT AND PLAN:   1. PAF: Remains in normal sinus rhythm. She is no longer on Cardizem. She is on Normodyne 400 mg twice a day, heparin infusion. CHADs Score 4 (Hypertension, Diabetes, TIA's). Would recommend anticoagulation, with either Coumadin or novel anticoagulation agent. Episode could be related to acute illness.  2. CAD: Cardiac catheterization completed on 07/29/2006, high-grade lesion of the ostium of the second obtuse marginal branch with normal LV function. OM2- small vessel, supplies a very small amount of myocardium, and attempts at intervening could compromise the circulation of the much larger OM 3 branch.  Most recent stress Myoview and June of 2013 revealed low risk study. We will continue to treat her medically unless she has recurrence of discomfort or EKG changes.  Troponin initially normal, but subsequent minimal elevation to 0.57  EKG does still have a inferior lateral ischemia or LVH noted. Will discuss with Dr. Lattie Haw the need to proceed with repeat cardiac catheterization.  3. Hypertension: Pressure has been low normal since admission. Averaging between 130/71-96/60. She is currently on amlodipine, olmesartan, and labetalol as discussed above. We will continue to monitor this. Creatinine at 0.83.  Phill Myron. Purcell Nails NP [facility] 02/29/2012, 3:47 PM  Cardiology Attending Patient interviewed and examined. Discussed with Jory Sims, NP.  Above note annotated and modified based upon my findings.  Not inclined to commit this woman to lifelong anticoagulation for a single brief and self-limited episode of atrial fibrillation in the setting of concomitant pulmonary problems. Amlodipine will be changed to diltiazem, which will likely reduce ventricular rate if atrial fibrillation recurs. Modest troponin elevation is consistent with a type II myocardial infarction related to the physiologic stress of her arrhythmia. I do not anticipate that additional testing for ischemic heart disease will be necessary.  Jacqulyn Ducking, MD 02/29/2012, 6:27 PM

## 2012-02-29 NOTE — Progress Notes (Signed)
md called and informed of patient being sob with 02 sat of 88-89 and c/o pain on inspiration. Orders given.

## 2012-02-29 NOTE — Progress Notes (Addendum)
Inpatient Diabetes Program Recommendations  AACE/ADA: New Consensus Statement on Inpatient Glycemic Control  Target Ranges:  Prepandial:   less than 140 mg/dL      Peak postprandial:   less than 180 mg/dL (1-2 hours)      Critically ill patients:  140 - 180 mg/dL  Pager:  [phone] Hours:  8 am-10pm   Reason for Visit: Elevated glucose in high 200s  Inpatient Diabetes Program Recommendations Insulin - Meal Coverage: Add home meal coverage:  Patient takes Humalog 12-14 units with each meal. Consider adding Novolog 12 units TID with meals.  Addendum: HgbA1C: 8.4 % Outpatient diabetes education referral placed.  Patient should follow-up with her endocrinologist, Dr. Chalmers Cater as well.  Courtney Heys PhD, RN, BC-ADM Diabetes Coordinator  Office:  [phone] Team Pager:  [phone]

## 2012-02-29 NOTE — Progress Notes (Signed)
UR Chart Review Completed  

## 2012-02-29 NOTE — Progress Notes (Addendum)
Placed patient on venti mask  Due to sats only 88 on 5.5 lpm [place] placed on 40% sats on moved up tp 89% then increased to 50% sats now 94% patients bbs diminshed with wheezes. Advised rn about patient and ask rn to ask md about prn nebs  no nebulizer treatments were ordered on patient.

## 2012-02-29 NOTE — Progress Notes (Addendum)
Right before beginning of shift, patient started complaining of shortness of breath. Day nurse called doctor and got orders. Night RN got results back and notified the doctor. Dr Megan Salon came up to the floor to look at the patient. He put in orders for a chest xray. Results came back for the BNP- 1447 and the chest xray came back as well. RN notified doctor with these results. Patient is on a venti mask 50% and is comfortable. Will continue to monitor.

## 2012-02-29 NOTE — Progress Notes (Signed)
*  PRELIMINARY RESULTS* Echocardiogram 2D Echocardiogram has been performed.  Virginia Huber 02/29/2012, 10:07 AM

## 2012-02-29 NOTE — Progress Notes (Signed)
[place] for Heparin Indication: chest pain/ACS  Allergies  Allergen Reactions  . Penicillins Hives  . Percocet (Oxycodone-Acetaminophen) Nausea And Vomiting   Patient Measurements: Height: 5\' 3"  (160 cm) Weight: 154 lb (69.854 kg) IBW/kg (Calculated) : 52.4  Heparin Dosing Weight: 68  Vital Signs: Temp: 99.2 F (37.3 C) (09/09 0500) Temp src: Oral (09/09 0500) BP: 122/65 mmHg (09/09 0500) Pulse Rate: 82  (09/09 0500)  Labs:  Flo Shanks 02/29/12 0506 02/28/12 2130 02/28/12 1835 02/28/12 1434 02/28/12 0823 02/28/12 0822 02/28/12 0501 02/27/12 2245  HGB 11.2* -- -- -- -- -- 11.1* --  HCT 33.4* -- -- -- -- -- 33.5* 38.3  PLT 214 -- -- -- -- -- 195 244  APTT -- -- -- -- -- -- -- --  LABPROT -- -- -- -- -- 13.7 -- --  INR -- -- -- -- -- 1.03 -- --  HEPARINUNFRC 0.10* 0.10* -- <0.10* -- -- -- --  CREATININE 0.83 -- -- -- -- -- 0.75 0.82  CKTOTAL -- -- -- -- -- -- -- --  CKMB -- -- -- -- -- -- -- --  TROPONINI <0.30 -- <0.30 -- 0.55* -- -- --   Estimated Creatinine Clearance: 62.5 ml/min (by C-G formula based on Cr of 0.83).  Medications:  Scheduled:     . amLODipine  10 mg Oral Daily  . aspirin  325 mg Oral Daily  . atorvastatin  20 mg Oral q1800  . diltiazem  25 mg Intravenous Once  . heparin  2,000 Units Intravenous Once  . heparin  4,000 Units Intravenous Once  . insulin aspart  0-20 Units Subcutaneous TID WC  . insulin aspart  0-5 Units Subcutaneous QHS  . insulin glargine  60 Units Subcutaneous QHS  . labetalol  400 mg Oral BID  . levothyroxine  137 mcg Oral QAC breakfast  . losartan  100 mg Oral Daily  . metFORMIN  500 mg Oral TID WC  . pantoprazole  40 mg Oral Q1200  . sodium chloride       Assessment: 66 yo female with resent Afib and elevated Troponin X 2. Heparin level remains below goal despite rate increase.  Goal of Therapy:  Heparin level 0.3-0.7 units/ml Monitor platelets by anticoagulation protocol: Yes   Plan:  Heparin Bolus 2000 units x 1, increase drip to 1500 units/hr Repeat Hep Level in 6 hours. CBC per protocol  Hart Robinsons A 02/29/2012,8:04 AM

## 2012-02-29 NOTE — Progress Notes (Addendum)
[facility] PROGRESS NOTE  STPHANIE BOLING [id number] DOB: January 29, 1946 DOA: 02/27/2012 PCP: Purvis Kilts, MD  Brief narrative: Virginia Huber is a 66 year old woman with a PMH of DM, hypothyroidism, non-obstructive CAD, and chronic stable angina who was admitted 02/28/12 with chest pressure unrelieved by home administration of NTG.  Upon initial evaluation in the ER, she was found to be in atrial fibrillation with RVR.  She quickly converted back to NSR after being given a bolus of Cardizem and being started on a Cardizem drip.  Assessment/Plan: Principal Problem:  *Atrial fib/flutter, transient  Converted back to NSR on Cardizem drip, which has been weaned off.  Home dose of Labetalol subsequently increased to 400 mg BID.  Trigger may have been an acute bronchitis.    On empiric IV heparin.  With a CHADS 2 score of at least 2 (1 point for HTN and 1 point for DM, ? More points for possible TIAs), will likely need coumadin, but if this was transient and induced by bronchitis, may not need coumadin, will await cardiology recommendations.  Cardiology consult pending.  F/U 2 D Echo results. Active Problems:  Bronchitis  Complains of fever and cough.  CXR clear.  Continue anti-tussives.  Add Azithromycin given documented fever.  DM (diabetes mellitus), type 2, uncontrolled  DM coordinator consulted for hemoglobin A1c of 8.4%, indicating sub-optimal outpatient control.  Patient states sugars have been high for several months.  CBGs 223-250 over past 24 hours.  Continue Glucophage, increase Lantus to 65 units, continue SSI.  Diabetes coordinator consulted.  HYPERLIPIDEMIA-MIXED  Continue Lipitor.  Triglyceride elevation noted on FLP despite statin therapy.  Add fish oil.  CAD, NATIVE VESSEL  Continue ASA, statin, beta blocker.  Suspect troponin elevation from demand ischemia, not ACS.  Chest pain free this morning.  OVERWEIGHT/OBESITY  Continue carbohydrate  modified diet.  HYPERTENSION, UNSPECIFIED  BP controlled on Norvasc, Labetalol and Cozaar.  Stable angina  Continue PRN NTG.  Cardiology consult pending.  Hypothyroid  Continue current dose Synthroid, TSH WNL.  Elevated troponin I level  Likely demand ischemia from rapid atrial fibrillation.   F/U 2 D Echo results and cardiology recommendations.  On empiric heparin.   Code Status: Full Family Communication: Husband updated at bedside. Disposition Plan: Home, when stable.   Medical Consultants:  Velora Heckler cardiology  Other Consultants:  Diabetes Coordinator.  Procedures:  2 D Echocardiogram 02/29/12: Pending.  Antibiotics:  Azithromycin 02/29/12--->  HPI/Subjective: Mrs. Larosa states she was short of breath this morning and has a cough productive of clear mucous.  She has some nasal congestion as well.  Fever noted to 102.1 last p.m.  No current chest pain.    Objective: Filed Vitals:   02/28/12 0250 02/28/12 0739 02/28/12 2140 02/29/12 0500  BP: 146/77  152/63 122/65  Pulse: 73 68 95 82  Temp: 98.6 F (37 C)  102.1 F (38.9 C) 99.2 F (37.3 C)  TempSrc: Oral  Oral Oral  Resp: 20  20 20   Height: 5\' 3"  (1.6 m)     Weight: 68.765 kg (151 lb 9.6 oz)   69.854 kg (154 lb)  SpO2: 96% 96% 96% 95%    Intake/Output Summary (Last 24 hours) at 02/29/12 1057 Last data filed at 02/29/12 0600  Gross per 24 hour  Intake 1329.58 ml  Output   2450 ml  Net -1120.42 ml    Exam: Gen:  NAD Cardiovascular:  RRR, No M/R/G Respiratory: Lungs CTAB Gastrointestinal: Abdomen soft, NT/ND with normal active  bowel sounds. Extremities: No C/E/C  Data Reviewed: Basic Metabolic Panel:  Lab 0000000 0506 02/28/12 0501 02/27/12 2245  NA 135 141 138  K 4.3 3.9 --  CL 104 106 101  CO2 23 23 23   GLUCOSE 267* 315* 318*  BUN 16 13 12   CREATININE 0.83 0.75 0.82  CALCIUM 8.3* 8.9 9.9  MG -- 1.7 --  PHOS -- -- --   GFR Estimated Creatinine Clearance: 62.5 ml/min (by C-G  formula based on Cr of 0.83). Liver Function Tests:  Lab 02/27/12 2245  AST 9  ALT 14  ALKPHOS 86  BILITOT 0.3  PROT 7.1  ALBUMIN 4.2   Coagulation profile  Lab 02/28/12 0822  INR 1.03  PROTIME --    CBC:  Lab 02/29/12 0506 02/28/12 0501 02/27/12 2245  WBC 15.2* 9.9 12.8*  NEUTROABS -- -- 9.6*  HGB 11.2* 11.1* 13.2  HCT 33.4* 33.5* 38.3  MCV 91.8 92.5 89.9  PLT 214 195 244   Cardiac Enzymes:  Lab 02/29/12 0506 02/28/12 1835 02/28/12 0823 02/28/12 0501 02/27/12 2245  CKTOTAL -- -- -- -- --  CKMB -- -- -- -- --  CKMBINDEX -- -- -- -- --  TROPONINI <0.30 <0.30 0.55* 0.57* <0.30   CBG:  Lab 02/29/12 0724 02/28/12 2150 02/28/12 1639 02/28/12 1152 02/28/12 0830  GLUCAP 250* 227* 244* 223* 243*   Hgb A1c  Basename 02/28/12 0823  HGBA1C 8.4*   Lipid Profile  Basename 02/29/12 0510  CHOL 127  HDL 25*  LDLCALC 24  TRIG 392*  CHOLHDL 5.1  LDLDIRECT --   Thyroid function studies  Basename 02/28/12 0501  TSH 2.000  T4TOTAL --  T3FREE --  THYROIDAB --   Microbiology Recent Results (from the past 240 hour(s))  CULTURE, BLOOD (ROUTINE X 2)     Status: Normal (Preliminary result)   Collection Time   02/28/12 10:35 PM      Component Value Range Status Comment   Specimen Description BLOOD LEFT ANTECUBITAL   Final    Special Requests     Final    Value: BOTTLES DRAWN AEROBIC AND ANAEROBIC AEB [place] ANA 10CC   Culture NO GROWTH 1 DAY   Final    Report Status PENDING   Incomplete   CULTURE, BLOOD (ROUTINE X 2)     Status: Normal (Preliminary result)   Collection Time   02/28/12 10:39 PM      Component Value Range Status Comment   Specimen Description BLOOD LEFT HAND   Final    Special Requests     Final    Value: BOTTLES DRAWN AEROBIC AND ANAEROBIC AEB [place] ANA 12CC   Culture NO GROWTH 1 DAY   Final    Report Status PENDING   Incomplete      Studies: Dg Chest Portable 1 View  02/27/2012  *RADIOLOGY REPORT*  Clinical Data: Chest pain  PORTABLE CHEST - 1  VIEW  Comparison: 11/28/2004  Findings: The heart and pulmonary vascularity are within normal limits.  The lungs are free of acute infiltrate or sizable effusion.  No acute bony abnormality is seen.  IMPRESSION: No acute abnormality.   Original Report Authenticated By: Everlene Farrier, M.D.     Scheduled Meds:   . amLODipine  10 mg Oral Daily  . aspirin  325 mg Oral Daily  . atorvastatin  20 mg Oral q1800  . heparin  2,000 Units Intravenous Once  . heparin  2,000 Units Intravenous Once  . insulin aspart  0-20  Units Subcutaneous TID WC  . insulin aspart  0-5 Units Subcutaneous QHS  . insulin glargine  60 Units Subcutaneous QHS  . labetalol  400 mg Oral BID  . levothyroxine  137 mcg Oral QAC breakfast  . losartan  100 mg Oral Daily  . metFORMIN  500 mg Oral TID WC  . pantoprazole  40 mg Oral Q1200  . sodium chloride       Continuous Infusions:   . 0.9 % NaCl with KCl 20 mEq / L 50 mL/hr at 02/28/12 1843  . heparin 1,500 Units/hr (02/29/12 0855)    Time spent: 35 minutes.   LOS: 2 days   RAMA,CHRISTINA  [facility] Pager [phone].  If 8PM-8AM, please contact night-coverage at [email or web address], password [facility] 02/29/2012, 10:57 AM

## 2012-02-29 NOTE — Progress Notes (Signed)
[place] for Heparin Indication: chest pain/ACS  Allergies  Allergen Reactions  . Penicillins Hives  . Percocet (Oxycodone-Acetaminophen) Nausea And Vomiting   Patient Measurements: Height: 5\' 3"  (160 cm) Weight: 154 lb (69.854 kg) IBW/kg (Calculated) : 52.4  Heparin Dosing Weight: 68  Vital Signs: Temp: 98.6 F (37 C) (09/09 1419) Temp src: Oral (09/09 1419) BP: 96/60 mmHg (09/09 1419) Pulse Rate: 79  (09/09 1419)  Labs:  Basename 02/29/12 1529 02/29/12 0506 02/28/12 2130 02/28/12 1835 02/28/12 0823 02/28/12 0822 02/28/12 0501 02/27/12 2245  HGB -- 11.2* -- -- -- -- 11.1* --  HCT -- 33.4* -- -- -- -- 33.5* 38.3  PLT -- 214 -- -- -- -- 195 244  APTT -- -- -- -- -- -- -- --  LABPROT -- -- -- -- -- 13.7 -- --  INR -- -- -- -- -- 1.03 -- --  HEPARINUNFRC 0.18* 0.10* 0.10* -- -- -- -- --  CREATININE -- 0.83 -- -- -- -- 0.75 0.82  CKTOTAL -- -- -- -- -- -- -- --  CKMB -- -- -- -- -- -- -- --  TROPONINI -- <0.30 -- <0.30 0.55* -- -- --   Estimated Creatinine Clearance: 62.5 ml/min (by C-G formula based on Cr of 0.83).  Medications:  Scheduled:     . amLODipine  10 mg Oral Daily  . aspirin  325 mg Oral Daily  . atorvastatin  20 mg Oral q1800  . azithromycin  500 mg Oral Daily   Followed by  . azithromycin  250 mg Oral Daily  . heparin  2,000 Units Intravenous Once  . heparin  2,000 Units Intravenous Once  . insulin aspart  0-20 Units Subcutaneous TID WC  . insulin aspart  0-5 Units Subcutaneous QHS  . insulin glargine  65 Units Subcutaneous QHS  . labetalol  400 mg Oral BID  . levothyroxine  137 mcg Oral QAC breakfast  . losartan  100 mg Oral Daily  . metFORMIN  500 mg Oral TID WC  . omega-3 acid ethyl esters  1 g Oral BID  . pantoprazole  40 mg Oral Q1200  . sodium chloride      . DISCONTD: insulin glargine  60 Units Subcutaneous QHS   Assessment: 66 yo female with resent Afib and elevated Troponin X 2. Heparin level  remains below goal despite multiple rate increases.  Goal of Therapy:  Heparin level 0.3-0.7 units/ml Monitor platelets by anticoagulation protocol: Yes   Plan:  Heparin Bolus 2000 units x 1, increase drip to 1750 units/hr Repeat Hep Level in am. CBC per protocol  Hart Robinsons A 02/29/2012,4:28 PM

## 2012-03-01 ENCOUNTER — Inpatient Hospital Stay (HOSPITAL_COMMUNITY): Payer: Medicare Other

## 2012-03-01 DIAGNOSIS — J9601 Acute respiratory failure with hypoxia: Secondary | ICD-10-CM | POA: Diagnosis present

## 2012-03-01 DIAGNOSIS — J159 Unspecified bacterial pneumonia: Secondary | ICD-10-CM | POA: Diagnosis present

## 2012-03-01 DIAGNOSIS — J189 Pneumonia, unspecified organism: Secondary | ICD-10-CM | POA: Diagnosis present

## 2012-03-01 HISTORY — DX: Acute respiratory failure with hypoxia: J96.01

## 2012-03-01 LAB — CBC WITH DIFFERENTIAL/PLATELET
Basophils Absolute: 0 10*3/uL (ref 0.0–0.1)
Basophils Relative: 0 % (ref 0–1)
HCT: 29 % — ABNORMAL LOW (ref 36.0–46.0)
Hemoglobin: 9.8 g/dL — ABNORMAL LOW (ref 12.0–15.0)
Lymphocytes Relative: 11 % — ABNORMAL LOW (ref 12–46)
Monocytes Absolute: 0.9 10*3/uL (ref 0.1–1.0)
Monocytes Relative: 8 % (ref 3–12)
Neutro Abs: 8.6 10*3/uL — ABNORMAL HIGH (ref 1.7–7.7)
Neutrophils Relative %: 80 % — ABNORMAL HIGH (ref 43–77)
WBC: 10.8 10*3/uL — ABNORMAL HIGH (ref 4.0–10.5)

## 2012-03-01 LAB — BLOOD GAS, ARTERIAL
Bicarbonate: 20.4 mEq/L (ref 20.0–24.0)
Drawn by: 22223
Patient temperature: 37
TCO2: 18.8 mmol/L (ref 0–100)
pCO2 arterial: 32.4 mmHg — ABNORMAL LOW (ref 35.0–45.0)
pH, Arterial: 7.415 (ref 7.350–7.450)

## 2012-03-01 LAB — HEPARIN LEVEL (UNFRACTIONATED): Heparin Unfractionated: <0.1 IU/mL — ABNORMAL LOW (ref 0.30–0.70)

## 2012-03-01 LAB — CBC
HCT: 29.5 % — ABNORMAL LOW (ref 36.0–46.0)
Hemoglobin: 9.8 g/dL — ABNORMAL LOW (ref 12.0–15.0)
MCH: 30.4 pg (ref 26.0–34.0)
MCV: 91.6 fL (ref 78.0–100.0)
RBC: 3.22 MIL/uL — ABNORMAL LOW (ref 3.87–5.11)

## 2012-03-01 LAB — GLUCOSE, CAPILLARY
Glucose-Capillary: 124 mg/dL — ABNORMAL HIGH (ref 70–99)
Glucose-Capillary: 140 mg/dL — ABNORMAL HIGH (ref 70–99)
Glucose-Capillary: 171 mg/dL — ABNORMAL HIGH (ref 70–99)
Glucose-Capillary: 153 mg/dL — ABNORMAL HIGH (ref 70–99)

## 2012-03-01 LAB — EXPECTORATED SPUTUM ASSESSMENT W GRAM STAIN, RFLX TO RESP C

## 2012-03-01 LAB — BASIC METABOLIC PANEL
Chloride: 100 mEq/L (ref 96–112)
GFR calc Af Amer: 59 mL/min — ABNORMAL LOW (ref >90)
GFR calc non Af Amer: 51 mL/min — ABNORMAL LOW (ref >90)
Potassium: 3.8 mEq/L (ref 3.5–5.1)
Sodium: 131 mEq/L — ABNORMAL LOW (ref 135–145)

## 2012-03-01 LAB — STREP PNEUMONIAE URINARY ANTIGEN: Strep Pneumo Urinary Antigen: NEGATIVE

## 2012-03-01 MED ORDER — SODIUM CHLORIDE 0.9 % IJ SOLN
INTRAMUSCULAR | Status: AC
  Administered 2012-03-01: 12:00:00
  Filled 2012-03-01: qty 3

## 2012-03-01 MED ORDER — SODIUM CHLORIDE 0.9 % IN NEBU
INHALATION_SOLUTION | RESPIRATORY_TRACT | Status: AC
  Administered 2012-03-01: 3 mL
  Filled 2012-03-01: qty 3

## 2012-03-01 MED ORDER — ALBUTEROL SULFATE (5 MG/ML) 0.5% IN NEBU
2.5000 mg | INHALATION_SOLUTION | RESPIRATORY_TRACT | Status: DC
  Administered 2012-03-01 – 2012-03-02 (×6): 2.5 mg via RESPIRATORY_TRACT
  Filled 2012-03-01 (×6): qty 0.5

## 2012-03-01 NOTE — Progress Notes (Addendum)
SUBJECTIVE: I have pneumonia.  Trouble breathing overnight requiring breathing treatments.  Principal Problem:  *Atrial fib/flutter, transient Active Problems:  HYPERLIPIDEMIA-MIXED  CAD, NATIVE VESSEL  OVERWEIGHT/OBESITY  HYPERTENSION, UNSPECIFIED  Stable angina  Hypothyroid  Elevated troponin I level  DM (diabetes mellitus), type 2, uncontrolled  Bronchitis  Acute non-ST segment elevation myocardial infarction  Community acquired bacterial pneumonia  LABS: Basic Metabolic Panel:  Basename 02/29/12 0506 02/28/12 0501  NA 135 141  K 4.3 3.9  CL 104 106  CO2 23 23  GLUCOSE 267* 315*  BUN 16 13  CREATININE 0.83 0.75  CALCIUM 8.3* 8.9  MG -- 1.7  PHOS -- --   Liver Function Tests:  Basename 02/27/12 2245  AST 9  ALT 14  ALKPHOS 86  BILITOT 0.3  PROT 7.1  ALBUMIN 4.2   CBC:  Basename 03/01/12 0503 02/29/12 0506 02/27/12 2245  WBC 11.1* 15.2* --  NEUTROABS -- -- 9.6*  HGB 9.8* 11.2* --  HCT 29.5* 33.4* --  MCV 91.6 91.8 --  PLT 199 214 --   Cardiac Enzymes:  Basename 02/29/12 0506 02/28/12 1835 02/28/12 0823  CKTOTAL -- -- --  CKMB -- -- --  CKMBINDEX -- -- --  TROPONINI <0.30 <0.30 0.55*   Hemoglobin A1C:  Basename 02/29/12 0506  HGBA1C 8.5*   Fasting Lipid Panel:  Basename 02/29/12 0510  CHOL 127  HDL 25*  LDLCALC 24  TRIG 392*  CHOLHDL 5.1  LDLDIRECT --   Thyroid Function Tests:  Basename 02/28/12 0501  TSH 2.000  T4TOTAL --  T3FREE --  THYROIDAB --    RADIOLOGY: Dg Chest Port 1 View  02/29/2012  *RADIOLOGY REPORT*  Clinical Data: Shortness of breath.  PORTABLE CHEST - 1 VIEW 02/29/2012 2011 hours:  Comparison: Portable chest x-ray 02/27/2012.  Findings: Interval development of airspace consolidation in both lung bases, left greater than right.  Cardiac silhouette upper normal in size to slightly enlarged but stable.  Pulmonary vascularity normal without evidence of pulmonary edema.  IMPRESSION: Acute pneumonia involving the lung  bases, left greater than right.   Original Report Authenticated By: Deniece Portela, M.D.      PHYSICAL EXAM BP 108/54  Pulse 73  Temp 99.5 F (37.5 C) (Oral)  Resp 20  Ht 5\' 3"  (1.6 m)  Wt 155 lb 11.2 oz (70.625 kg)  BMI 27.58 kg/m2  SpO2 90% General: Well developed, well nourished, in no acute distress Head: Eyes PERRLA, No xanthomas.   Normal cephalic and atramatic  Lungs: Inspiratory wheezes, with crackles noted in the LLL, Heart: HRRR S1 S2, No MRG .  Pulses are 2+ & equal.            No carotid bruit. No JVD.  No abdominal bruits. No femoral bruits. Abdomen: Bowel sounds are positive, abdomen soft and non-tender without masses or                  Hernia's noted. Msk:  Back normal, normal gait. Normal strength and tone for age. Extremities: No clubbing, cyanosis or edema.  DP +1 Neuro: Alert and oriented X 3. Psych:  Good affect, responds appropriately  TELEMETRY: Reviewed telemetry pt in NSR. T-wave flattening,  ASSESSMENT AND PLAN:  1. PAF: Staying in NSR. Remains on cardiazem in divided doses. Will change to long acting in am.  2. CAD: Cardiac catheterization completed on 07/29/2006, high-grade lesion of the ostium of the second obtuse marginal branch with normal LV function. OM2- small vessel, supplies a very  small amount of myocardium, and attempts at intervening could compromise the circulation of the much larger OM 3 branch. Most recent stress Myoview and June of 2013 revealed low risk study..No plans to send for cath at this time.  3. Pneumonia: Currently being treated with IV antibiotics and breathing treatments. Slow improvement.    Phill Myron. Purcell Nails NP [facility] 03/01/2012, 3:25 PM  Cardiology Attending Patient interviewed and examined. Discussed with Jory Sims, NP.  Above note annotated and modified based upon my findings.  Atrial arrhythmia likely related to pneumonia and hopefully will not recur when she recovers from her respiratory  infection.  Hypertension is adequately controlled. Please call if we can provide further assistance during this hospitalization. We will arrange outpatient Cardiology followup.  Jacqulyn Ducking, MD 03/01/2012, 5:11 PM

## 2012-03-01 NOTE — Progress Notes (Signed)
Patient transferred to step down unit. Report given to [facility]. Patient transferred without any difficulty.

## 2012-03-01 NOTE — Progress Notes (Signed)
[facility] PROGRESS NOTE  Virginia Huber [id number] DOB: 09-30-1945 DOA: 02/27/2012 PCP: Purvis Kilts, MD  Brief narrative:   66 year old woman with a PMH of DM, hypothyroidism, non-obstructive CAD, and chronic stable angina who was admitted on  02/28/12 with chest pressure unrelieved by s/l nitrate  at home. Upon initial evaluation in the ER, she was found to be in atrial fibrillation with RVR. She quickly converted back to NSR after being given a bolus of Cardizem and being started on a Cardizem drip.   Assessment/Plan:  Principal Problem:   *Atrial fib/flutter, transient  Converted back to NSR on Cardizem drip, which has been weaned off. Home dose of Labetalol subsequently increased to 400 mg BID.  Symptoms likely triggered by acute bronchitis/ pneumonia.  He was placed on  empiric IV heparin given a CHADS 2 score of at least 2. However since her chads 2 score is not that high and her symptoms off A. fib is transient related to acute underlying infection cardiology is not recommended for long-term Coumadin. 2-D echo is normal. She is now placed on Cardizem 60 mg 3 times a day. Discontinued amlodipine. Her heart rate is currently stable with sinus rhythm on telemetry. If remains stable we can switch her to long acting Cardizem.   Active Problems:  Community-acquired pneumonia Patient complained of cough with fever at home and was placed on azithromycin with concern for bronchitis on admission. However she  developed acute onset shortness of breath overnight and a chest x-ray oh obtained showed bilateral lower lobe infiltrate which was not evident on admission x-ray. She has been switched to IV Avelox and will continue with it (started on 03/01/12). Stop azithromycin. Add scheduled nebs. Check o2 sats and titrate face mask to [place].   DM (diabetes mellitus), type 2, uncontrolled  DM coordinator consulted for hemoglobin A1c of 8.5%,        Continue Glucophage, increased Lantus  to 65 units, continue SSI.  CBGs more stable past 24 hrs   HYPERLIPIDEMIA-MIXED  Continue Lipitor.  Triglyceride elevated and fish oil was added    CAD, NATIVE VESSEL  Continue ASA, statin, beta blocker.  Suspect troponin elevation from demand ischemia, not ACS. Chest pain free . As per cardiology , no need for further test and recommend no need for long term for anticoagulation.  OVERWEIGHT/OBESITY  Continue carbohydrate modified diet.   HYPERTENSION, UNSPECIFIED  BP controlled on cardizem, Labetalol and Cozaar.  Stable angina  Continue PRN NTG. Cardiology consult appreciated   Hypothyroid  Continue current dose of  Synthroid, TSH WNL.   Elevated troponin I level  Likely demand ischemia from rapid atrial fibrillation.  2-D echo within normal limits. Patient asymptomatic and no further workup needed as per cardiology.   Code Status: Full   Family Communication: Husband updated at bedside.   Disposition Plan: Home once stable.   Medical Consultants:  [facility] Heckler cardiology   Other Consultants:  Diabetes Coordinator.  Procedures:  2 D Echocardiogram 02/29/12 Antibiotics:  IV Avelox 03/01/12>>   Consultants:  Dr. Simonne Maffucci [facility] cardiology)  Procedures:  2-D echo on 02/29/12  Antibiotics:  On IV Avelox since 03/01/12  HPI/Subjective: And was acutely short of breath overnight and transition to facemask. Her chest x-ray OP and showed bilateral pneumonia left greater than right and was placed on IV Avelox. She informs her breathing to be better this morning but has cough.  Objective: Filed Vitals:   03/01/12 0755 03/01/12 0918 03/01/12 1009 03/01/12 1106  BP:  126/60 91/52   Pulse:   68   Temp:   98.7 F (37.1 C)   TempSrc:      Resp:   20   Height:      Weight:      SpO2: 93%  95% 94%    Intake/Output Summary (Last 24 hours) at 03/01/12 1213 Last data filed at 03/01/12 0800  Gross per 24 hour  Intake   1170 ml  Output   1925 ml  Net   -755 ml     Filed Weights   02/28/12 0250 02/29/12 0500 03/01/12 0515  Weight: 68.765 kg (151 lb 9.6 oz) 69.854 kg (154 lb) 70.625 kg (155 lb 11.2 oz)    Exam:   General:  Elderly female lying in bed on face mask, in no acute distress  HEENT: No pallor moist oral mucosa, no JVD  Cardiovascular: Normal S1 and S2 with regular rhythm, no murmurs rub or gallop  Respiratory: Bilateral scattered rhonchi with few basal crackles  Abdomen: Soft, nontender, nondistended, bowel sounds present  Extremities: Warm, no edema  CNS: AAO x3, nonfocal  Data Reviewed: Basic Metabolic Panel:  Lab 0000000 0506 02/28/12 0501 02/27/12 2245  NA 135 141 138  K 4.3 3.9 4.0  CL 104 106 101  CO2 23 23 23   GLUCOSE 267* 315* 318*  BUN 16 13 12   CREATININE 0.83 0.75 0.82  CALCIUM 8.3* 8.9 9.9  MG -- 1.7 --  PHOS -- -- --   Liver Function Tests:  Lab 02/27/12 2245  AST 9  ALT 14  ALKPHOS 86  BILITOT 0.3  PROT 7.1  ALBUMIN 4.2   No results found for this basename: LIPASE:5,AMYLASE:5 in the last 168 hours No results found for this basename: AMMONIA:5 in the last 168 hours CBC:  Lab 03/01/12 0503 02/29/12 0506 02/28/12 0501 02/27/12 2245  WBC 11.1* 15.2* 9.9 12.8*  NEUTROABS -- -- -- 9.6*  HGB 9.8* 11.2* 11.1* 13.2  HCT 29.5* 33.4* 33.5* 38.3  MCV 91.6 91.8 92.5 89.9  PLT 199 214 195 244   Cardiac Enzymes:  Lab 02/29/12 0506 02/28/12 1835 02/28/12 0823 02/28/12 0501 02/27/12 2245  CKTOTAL -- -- -- -- --  CKMB -- -- -- -- --  CKMBINDEX -- -- -- -- --  TROPONINI <0.30 <0.30 0.55* 0.57* <0.30   BNP (last 3 results)  Basename 02/29/12 1945  PROBNP 1447.0*   CBG:  Lab 03/01/12 1142 03/01/12 0723 02/29/12 2059 02/29/12 1643 02/29/12 1146  GLUCAP 140* 124* 171* 199* 197*    Recent Results (from the past 240 hour(s))  CULTURE, BLOOD (ROUTINE X 2)     Status: Normal (Preliminary result)   Collection Time   02/28/12 10:35 PM      Component Value Range Status Comment   Specimen Description  BLOOD LEFT ANTECUBITAL   Final    Special Requests     Final    Value: BOTTLES DRAWN AEROBIC AND ANAEROBIC AEB=15CC ANA=10CC   Culture NO GROWTH 2 DAYS   Final    Report Status PENDING   Incomplete   CULTURE, BLOOD (ROUTINE X 2)     Status: Normal (Preliminary result)   Collection Time   02/28/12 10:39 PM      Component Value Range Status Comment   Specimen Description BLOOD LEFT HAND   Final    Special Requests     Final    Value: BOTTLES DRAWN AEROBIC AND ANAEROBIC AEB=15CC ANA=12CC   Culture NO GROWTH 2 DAYS  Final    Report Status PENDING   Incomplete      Studies: Dg Chest Port 1 View  02/29/2012  *RADIOLOGY REPORT*  Clinical Data: Shortness of breath.  PORTABLE CHEST - 1 VIEW 02/29/2012 2011 hours:  Comparison: Portable chest x-ray 02/27/2012.  Findings: Interval development of airspace consolidation in both lung bases, left greater than right.  Cardiac silhouette upper normal in size to slightly enlarged but stable.  Pulmonary vascularity normal without evidence of pulmonary edema.  IMPRESSION: Acute pneumonia involving the lung bases, left greater than right.   Original Report Authenticated By: Deniece Portela, M.D.     Scheduled Meds:   . albuterol  2.5 mg Nebulization Q4H  . atorvastatin  20 mg Oral q1800  . azithromycin  500 mg Oral Daily  . diltiazem  60 mg Oral TID  . furosemide  20 mg Intravenous Once  . heparin  2,000 Units Intravenous Once  . heparin  5,000 Units Subcutaneous Q8H  . insulin aspart  0-20 Units Subcutaneous TID WC  . insulin aspart  0-5 Units Subcutaneous QHS  . insulin glargine  65 Units Subcutaneous QHS  . labetalol  400 mg Oral BID  . levothyroxine  137 mcg Oral QAC breakfast  . losartan  100 mg Oral Daily  . metFORMIN  500 mg Oral TID WC  . moxifloxacin  400 mg Intravenous Q24H  . omega-3 acid ethyl esters  1 g Oral BID  . pantoprazole  40 mg Oral Q1200  . sodium chloride      . sodium chloride      . sodium chloride      . DISCONTD:  amLODipine  10 mg Oral Daily  . DISCONTD: aspirin  325 mg Oral Daily  . DISCONTD: azithromycin  250 mg Oral Daily   Continuous Infusions:   . DISCONTD: 0.9 % NaCl with KCl 20 mEq / L 20 mL/hr at 02/29/12 1139  . DISCONTD: heparin 1,750 Units/hr (02/29/12 1641)      Time spent: 30 minutes    Aamir Mclinden, [place]  [facility] Pager [phone] If 8PM-8AM, please contact night-coverage at [email or web address], password [facility] 03/01/2012, 12:13 PM  LOS: 3 days

## 2012-03-01 NOTE — Progress Notes (Signed)
Pt called for nurse, nurse arrived to room and pt states that she is having difficulty breathing.  O2 sats reading 80-82% on 3L nasal canula.  Respiratory notified and stated they would come to the floor to give breathing treatment.  Pt oxygen increased to 5L nasal canula and O2 saturation 88%. Respiratory rate 36 at this time. Giving robitussin for cough. Will continue to monitor.

## 2012-03-02 DIAGNOSIS — D519 Vitamin B12 deficiency anemia, unspecified: Secondary | ICD-10-CM | POA: Diagnosis present

## 2012-03-02 DIAGNOSIS — D649 Anemia, unspecified: Secondary | ICD-10-CM

## 2012-03-02 DIAGNOSIS — J9801 Acute bronchospasm: Secondary | ICD-10-CM

## 2012-03-02 HISTORY — DX: Acute bronchospasm: J98.01

## 2012-03-02 LAB — CBC
MCHC: 33.3 g/dL (ref 30.0–36.0)
Platelets: 214 10*3/uL (ref 150–400)
RDW: 13.4 % (ref 11.5–15.5)
WBC: 9.3 10*3/uL (ref 4.0–10.5)

## 2012-03-02 LAB — LEGIONELLA ANTIGEN, URINE

## 2012-03-02 LAB — GLUCOSE, CAPILLARY
Glucose-Capillary: 99 mg/dL (ref 70–99)
Glucose-Capillary: 156 mg/dL — ABNORMAL HIGH (ref 70–99)

## 2012-03-02 LAB — IRON AND TIBC
Iron: 16 ug/dL — ABNORMAL LOW (ref 42–135)
Saturation Ratios: 8 % — ABNORMAL LOW (ref 20–55)
TIBC: 211 ug/dL — ABNORMAL LOW (ref 250–470)
UIBC: 195 ug/dL (ref 125–400)

## 2012-03-02 LAB — FERRITIN: Ferritin: 433 ng/mL — ABNORMAL HIGH (ref 10–291)

## 2012-03-02 MED ORDER — SODIUM CHLORIDE 0.9 % IN NEBU
INHALATION_SOLUTION | RESPIRATORY_TRACT | Status: AC
  Administered 2012-03-02: 3 mL
  Filled 2012-03-02: qty 3

## 2012-03-02 MED ORDER — LORAZEPAM 2 MG/ML IJ SOLN
1.0000 mg | Freq: Once | INTRAMUSCULAR | Status: AC | PRN
  Administered 2012-03-02: 1 mg via INTRAVENOUS
  Filled 2012-03-02: qty 1

## 2012-03-02 MED ORDER — LEVALBUTEROL HCL 0.63 MG/3ML IN NEBU
0.6300 mg | INHALATION_SOLUTION | Freq: Four times a day (QID) | RESPIRATORY_TRACT | Status: DC
  Administered 2012-03-02 – 2012-03-06 (×14): 0.63 mg via RESPIRATORY_TRACT
  Filled 2012-03-02 (×15): qty 3

## 2012-03-02 MED ORDER — SODIUM CHLORIDE 0.9 % IJ SOLN
INTRAMUSCULAR | Status: AC
  Administered 2012-03-02: 10 mL
  Filled 2012-03-02: qty 3

## 2012-03-02 MED ORDER — FUROSEMIDE 10 MG/ML IJ SOLN
40.0000 mg | Freq: Once | INTRAMUSCULAR | Status: AC
  Administered 2012-03-02: 40 mg via INTRAVENOUS
  Filled 2012-03-02: qty 4

## 2012-03-02 MED ORDER — LORAZEPAM 2 MG/ML IJ SOLN
0.5000 mg | Freq: Once | INTRAMUSCULAR | Status: AC
  Administered 2012-03-02: 0.5 mg via INTRAVENOUS
  Filled 2012-03-02: qty 1

## 2012-03-02 MED ORDER — LEVALBUTEROL HCL 0.63 MG/3ML IN NEBU
0.6300 mg | INHALATION_SOLUTION | Freq: Four times a day (QID) | RESPIRATORY_TRACT | Status: DC
  Filled 2012-03-02: qty 3

## 2012-03-02 MED ORDER — MOXIFLOXACIN HCL 400 MG PO TABS
400.0000 mg | ORAL_TABLET | Freq: Every day | ORAL | Status: DC
  Administered 2012-03-02 – 2012-03-03 (×2): 400 mg via ORAL
  Filled 2012-03-02 (×2): qty 1

## 2012-03-02 NOTE — Progress Notes (Signed)
Subjective:  Tired, doesn't feel well. No cardiac complaints. Reviewed echo results with patient and husband.  Objective:  Vital Signs in the last 24 hours: Temp:  [98.1 F (36.7 C)-101.5 F (38.6 C)] 98.4 F (36.9 C) (09/11 0400) Pulse Rate:  [58-80] 70  (09/11 0801) Resp:  [19-36] 25  (09/11 0801) BP: (91-144)/(41-73) 104/41 mmHg (09/11 0600) SpO2:  [85 %-99 %] 98 % (09/11 0801) FiO2 (%):  [50 %] 50 % (09/11 0300)  Intake/Output from previous day: 09/10 0701 - 09/11 0700 In: 840 [P.O.:840] Out: 750 [Urine:750]  Physical Exam: NECK: Without JVD, HJR, or bruit LUNGS: Decreased breath sounds with inspiratory and expiratory wheezing. HEART: Regular rate and rhythm, no murmur, gallop, rub, bruit, thrill, or heave EXTREMITIES: Without cyanosis, clubbing, or edema  Lab Results:  Basename 03/02/12 0422 03/01/12 2126  WBC 9.3 10.8*  HGB 9.2* 9.8*  PLT 214 207    Basename 03/01/12 2126 02/29/12 0506  NA 131* 135  K 3.8 4.3  CL 100 104  CO2 20 23  GLUCOSE 151* 267*  BUN 19 16  CREATININE 1.11* 0.83    Echocardiogram  - Left ventricle: The cavity size was normal. There was moderate concentric hypertrophy. Systolic function was normal. The estimated ejection fraction was in the range of 55% to 60%. Wall motion was normal; there were no regional wall motion abnormalities. - Left atrium: The atrium was mildly dilated. - Atrial septum: No defect or patent foramen ovale was identified. Impressions: No interval change   CXR-some improvement in pulmonary infiltrates.   Assessment/Plan:  1. PAF: maintaining NSR and now on Diltiazem. Hold off on anticoagulation with brief episode of atrial fib in setting of pulmonary problems. Normal LV function on 2Decho. Follow up with Dr. Verl Blalock after discharge.  2. CAD: Continue medical therapy. Cardiac catheterization completed on 07/29/2006, high-grade lesion of the ostium of the second obtuse marginal branch with normal LV function. OM2-  small vessel, supplies a very small amount of myocardium, and attempts at intervening could compromise the circulation of the much larger OM 3 branch.  Most recent stress Myoview and June of 2013 revealed low risk study. We will continue to treat her medically unless she has recurrence of discomfort or EKG changes. Troponin initially normal, but subsequent minimal elevation to 0.57  EKG: inferiolateral ischemia vs LVH  3. Hypertension: stable.  LOS: 4 days   Ermalinda Barrios 03/02/2012, 8:34 AM  Cardiology Attending Patient interviewed and examined. Discussed with Ermalinda Barrios, PA-C.  Above note annotated and modified based upon my findings.  Patient generally improved and maintaining sinus rhythm.  Possible LVH-continue diuresis as tolerated.  Hypertension is well-controlled.  Jacqulyn Ducking, MD 03/02/2012, 5:57 PM

## 2012-03-02 NOTE — Progress Notes (Signed)
Chart reviewed.  Discussed with Dr. Megan Salon and RN.  Overnight, patient was transferred to [facility] unit because of worsening hypoxia, tachypnea, and respiratory distress. Head BiPAP briefly.  Subjective: Feels a little bit better. Does have orthopnea. Wheezing a bit. Wet cough without sputum production. No chest pain. No palpitations. Felt claustrophobic on BiPAP, but better after IV Ativan given.  Objective: Vital signs in last 24 hours: Filed Vitals:   03/02/12 0500 03/02/12 0600 03/02/12 0753 03/02/12 0801  BP: 104/41 104/41    Pulse: 58 58  70  Temp:      TempSrc:      Resp: 22 23  25   Height:      Weight:      SpO2: 97% 98% 95% 98%   Weight change:   Intake/Output Summary (Last 24 hours) at 03/02/12 0831 Last data filed at 03/01/12 1700  Gross per 24 hour  Intake    480 ml  Output    350 ml  Net    130 ml   Telemetry: Normal sinus rhythm  General: Eating breakfast. No labored breathing noted. Lungs: Rales at the bases which clear with cough. Mild expiratory wheeze, 8. No rhonchi. Cardiovascular regular rate rhythm without murmurs gallops rubs Abdomen soft nontender nondistended Extremities no clubbing cyanosis or edema  Lab Results: Basic Metabolic Panel:  Lab [id number] 2126 02/29/12 0506 02/28/12 0501  NA 131* 135 --  K 3.8 4.3 --  CL 100 104 --  CO2 20 23 --  GLUCOSE 151* 267* --  BUN 19 16 --  CREATININE 1.11* 0.83 --  CALCIUM 8.6 8.3* --  MG -- -- 1.7  PHOS -- -- --   Liver Function Tests:  Lab 02/27/12 2245  AST 9  ALT 14  ALKPHOS 86  BILITOT 0.3  PROT 7.1  ALBUMIN 4.2   No results found for this basename: LIPASE:2,AMYLASE:2 in the last 168 hours No results found for this basename: AMMONIA:2 in the last 168 hours CBC:  Lab 03/02/12 0422 03/01/12 2126 02/27/12 2245  WBC 9.3 10.8* --  NEUTROABS -- 8.6* 9.6*  HGB 9.2* 9.8* --  HCT 27.6* 29.0* --  MCV 91.1 91.2 --  PLT 214 207 --   Cardiac Enzymes:  Lab 02/29/12 0506 02/28/12 1835  02/28/12 0823  CKTOTAL -- -- --  CKMB -- -- --  CKMBINDEX -- -- --  TROPONINI <0.30 <0.30 0.55*   BNP:  Lab 02/29/12 1945  PROBNP 1447.0*   D-Dimer: No results found for this basename: DDIMER:2 in the last 168 hours CBG:  Lab 03/02/12 0734 03/01/12 2128 03/01/12 1633 03/01/12 1142 03/01/12 0723 02/29/12 2059  GLUCAP 99 153* 171* 140* 124* 171*   Hemoglobin A1C:  Lab 02/29/12 0506  HGBA1C 8.5*   Fasting Lipid Panel:  Lab 02/29/12 0510  CHOL 127  HDL 25*  LDLCALC 24  TRIG 392*  CHOLHDL 5.1  LDLDIRECT --   Thyroid Function Tests:  Lab 02/28/12 0501  TSH 2.000  T4TOTAL --  FREET4 1.35  T3FREE --  THYROIDAB --   Coagulation:  Lab 02/28/12 0822  LABPROT 13.7  INR 1.03   Anemia Panel: No results found for this basename: VITAMINB12,FOLATE,FERRITIN,TIBC,IRON,RETICCTPCT in the last 168 hours Urine Drug Screen: Drugs of Abuse  No results found for this basename: labopia, cocainscrnur, labbenz, amphetmu, thcu, labbarb    Alcohol Level: No results found for this basename: ETH:2 in the last 168 hours Urinalysis:  Lab 02/28/12 Whitney 1.010  PHURINE 5.5  GLUCOSEU >1000*  HGBUR NEGATIVE  BILIRUBINUR NEGATIVE  KETONESUR NEGATIVE  PROTEINUR NEGATIVE  UROBILINOGEN 0.2  NITRITE NEGATIVE  LEUKOCYTESUR NEGATIVE   Micro Results: Recent Results (from the past 240 hour(s))  CULTURE, BLOOD (ROUTINE X 2)     Status: Normal (Preliminary result)   Collection Time   02/28/12 10:35 PM      Component Value Range Status Comment   Specimen Description BLOOD LEFT ANTECUBITAL   Final    Special Requests     Final    Value: BOTTLES DRAWN AEROBIC AND ANAEROBIC AEB=15CC ANA=10CC   Culture NO GROWTH 2 DAYS   Final    Report Status PENDING   Incomplete   CULTURE, BLOOD (ROUTINE X 2)     Status: Normal (Preliminary result)   Collection Time   02/28/12 10:39 PM      Component Value Range Status Comment   Specimen Description BLOOD LEFT HAND   Final     Special Requests     Final    Value: BOTTLES DRAWN AEROBIC AND ANAEROBIC AEB=15CC ANA=12CC   Culture NO GROWTH 2 DAYS   Final    Report Status PENDING   Incomplete   CULTURE, EXPECTORATED SPUTUM-ASSESSMENT     Status: Normal   Collection Time   03/01/12  7:54 AM      Component Value Range Status Comment   Specimen Description SPUTUM EXPECTORATED   Final    Special Requests NONE   Final    Sputum evaluation     Final    Value: THIS SPECIMEN IS ACCEPTABLE. RESPIRATORY CULTURE REPORT TO FOLLOW.     Performed at [facility]   Report Status 03/01/2012 FINAL   Final   CULTURE, RESPIRATORY     Status: Normal (Preliminary result)   Collection Time   03/01/12  7:54 AM      Component Value Range Status Comment   Specimen Description SPUTUM EXPECTORATED   Final    Special Requests NONE   Final    Gram Stain     Final    Value: MODERATE WBC PRESENT,BOTH PMN AND MONONUCLEAR     RARE SQUAMOUS EPITHELIAL CELLS PRESENT     FEW GRAM POSITIVE COCCI     IN PAIRS FEW GRAM NEGATIVE RODS   Culture PENDING   Incomplete    Report Status PENDING   Incomplete   CLOSTRIDIUM DIFFICILE BY PCR     Status: Normal   Collection Time   03/01/12  1:07 PM      Component Value Range Status Comment   C difficile by pcr NEGATIVE  NEGATIVE Final   MRSA PCR SCREENING     Status: Normal   Collection Time   03/01/12 11:06 PM      Component Value Range Status Comment   MRSA by PCR NEGATIVE  NEGATIVE Final    Studies/Results: Dg Chest Port 1 View  03/01/2012  *RADIOLOGY REPORT*  Clinical Data: Fever and shortness of breath.  Follow-up pneumonia.  PORTABLE CHEST - 1 VIEW  Comparison: Earlier today.  Findings: The cardiac silhouette remains borderline enlarged.  No significant change in previously demonstrated bilateral airspace opacity with no visible pleural fluid other than a tiny amount of fluid in the minor fissure on the right.  There could be some fluid at the left lung base, obscured by the airspace opacity.   Thoracic spine degenerative changes.  IMPRESSION: Stable bilateral pneumonia.   Original Report Authenticated By: Gerald Stabs, M.D.    Dg Chest  Port 1 View  02/29/2012  *RADIOLOGY REPORT*  Clinical Data: Shortness of breath.  PORTABLE CHEST - 1 VIEW 02/29/2012 2011 hours:  Comparison: Portable chest x-ray 02/27/2012.  Findings: Interval development of airspace consolidation in both lung bases, left greater than right.  Cardiac silhouette upper normal in size to slightly enlarged but stable.  Pulmonary vascularity normal without evidence of pulmonary edema.  IMPRESSION: Acute pneumonia involving the lung bases, left greater than right.   Original Report Authenticated By: Deniece Portela, M.D.    Scheduled Meds:   . atorvastatin  20 mg Oral q1800  . diltiazem  60 mg Oral TID  . furosemide  40 mg Intravenous Once  . heparin  5,000 Units Subcutaneous Q8H  . insulin aspart  0-20 Units Subcutaneous TID WC  . insulin aspart  0-5 Units Subcutaneous QHS  . insulin glargine  65 Units Subcutaneous QHS  . labetalol  400 mg Oral BID  . levalbuterol  0.63 mg Nebulization QID  . levothyroxine  137 mcg Oral QAC breakfast  . LORazepam  0.5 mg Intravenous Once  . losartan  100 mg Oral Daily  . metFORMIN  500 mg Oral TID WC  . moxifloxacin  400 mg Oral q1800  . omega-3 acid ethyl esters  1 g Oral BID  . sodium chloride      . sodium chloride      . sodium chloride      . sodium chloride      . DISCONTD: albuterol  2.5 mg Nebulization Q4H  . DISCONTD: azithromycin  250 mg Oral Daily  . DISCONTD: moxifloxacin  400 mg Intravenous Q24H  . DISCONTD: pantoprazole  40 mg Oral Q1200   Continuous Infusions:  PRN Meds:.acetaminophen, bisacodyl, guaiFENesin-dextromethorphan, nitroGLYCERIN, ondansetron (ZOFRAN) IV, polyethylene glycol, sodium chloride, traZODone Assessment/Plan: Principal Problem:  *Acute respiratory failure: Likely secondary to pneumonia with bronchospasm. However, pro BNP 2 days ago was  over 1000. Also, patient's weight has increased by 10 pounds since admission. Will try a dose of Lasix. Ejection fraction normal. Changed low sodium diet. Limit fluid intake. Change to bronchodilator to Xopenex to avoid return of atrial fibrillation. Change to Avelox to by mouth. Seems to be doing well off BiPAP. Continue step down monitoring. Active Problems:  Community acquired bacterial pneumonia  CAD, NATIVE VESSEL  HYPERTENSION, UNSPECIFIED  Atrial fib/flutter, transient  DM (diabetes mellitus), type 2, improved  Acute non-ST segment elevation myocardial infarction secondary to pneumonia and atrial fibrillation  Anemia is worsening. Will check anemia panel and Hemoccult stools. monitor.  Bronchospasm  HYPERLIPIDEMIA-MIXED  OVERWEIGHT/OBESITY  Stable angina  Hypothyroid. TSH normal.   LOS: 4 days   Riley Hallum L 03/02/2012, 8:31 AM

## 2012-03-03 LAB — CULTURE, RESPIRATORY W GRAM STAIN

## 2012-03-03 LAB — CBC
HCT: 27.9 % — ABNORMAL LOW (ref 36.0–46.0)
MCH: 30.4 pg (ref 26.0–34.0)
MCHC: 33.3 g/dL (ref 30.0–36.0)
MCV: 91.2 fL (ref 78.0–100.0)
Platelets: 253 10*3/uL (ref 150–400)
RDW: 13.5 % (ref 11.5–15.5)
WBC: 9.3 10*3/uL (ref 4.0–10.5)

## 2012-03-03 LAB — BASIC METABOLIC PANEL
BUN: 20 mg/dL (ref 6–23)
Calcium: 9 mg/dL (ref 8.4–10.5)
Creatinine, Ser: 1.09 mg/dL (ref 0.50–1.10)
GFR calc Af Amer: 60 mL/min — ABNORMAL LOW (ref >90)

## 2012-03-03 LAB — GLUCOSE, CAPILLARY
Glucose-Capillary: 101 mg/dL — ABNORMAL HIGH (ref 70–99)
Glucose-Capillary: 120 mg/dL — ABNORMAL HIGH (ref 70–99)

## 2012-03-03 MED ORDER — LEVALBUTEROL HCL 0.63 MG/3ML IN NEBU
0.6300 mg | INHALATION_SOLUTION | RESPIRATORY_TRACT | Status: DC | PRN
  Administered 2012-03-03: 0.63 mg via RESPIRATORY_TRACT
  Filled 2012-03-03: qty 3

## 2012-03-03 MED ORDER — LEVALBUTEROL HCL 0.63 MG/3ML IN NEBU
INHALATION_SOLUTION | RESPIRATORY_TRACT | Status: AC
  Administered 2012-03-03: 06:00:00
  Filled 2012-03-03: qty 3

## 2012-03-03 MED ORDER — FUROSEMIDE 10 MG/ML IJ SOLN
80.0000 mg | Freq: Two times a day (BID) | INTRAMUSCULAR | Status: AC
  Administered 2012-03-03 – 2012-03-04 (×3): 80 mg via INTRAVENOUS
  Filled 2012-03-03 (×3): qty 8

## 2012-03-03 MED ORDER — CYANOCOBALAMIN 1000 MCG/ML IJ SOLN
1000.0000 ug | Freq: Every day | INTRAMUSCULAR | Status: DC
  Administered 2012-03-03 – 2012-03-08 (×6): 1000 ug via INTRAMUSCULAR
  Filled 2012-03-03 (×6): qty 1

## 2012-03-03 NOTE — Progress Notes (Signed)
Patient wore her BIPAP for 6 hrs until she woke up feeling hot and uncomfortable. Patient was placed back her [place] by nursing staff;will continue to monitor.

## 2012-03-03 NOTE — Progress Notes (Signed)
Able to stay off BiPAP during the day, but required BiPAP in the evening and overnight.  Subjective: Felt very short of breath last night at about 7. Did not notice much diuresis after Lasix. Still with some cough. Bronchodilators help her shortness of breath. Feels weak. Has had loose stools which is not uncommon for her. No bleeding noted.  Objective: Vital signs in last 24 hours: Filed Vitals:   03/03/12 0500 03/03/12 0557 03/03/12 0734 03/03/12 0800  BP: 118/50     Pulse: 67     Temp:    99.9 F (37.7 C)  TempSrc:    Oral  Resp: 32     Height:      Weight: 70.4 kg (155 lb 3.3 oz)     SpO2: 94% 94% 93%    Weight change:   Intake/Output Summary (Last 24 hours) at 03/03/12 0937 Last data filed at 03/03/12 0500  Gross per 24 hour  Intake   1930 ml  Output   1100 ml  Net    830 ml   Telemetry: Normal sinus rhythm  General: Alert, oriented, appears tired. No respiratory distress noted Lungs: No rales. No rhonchi. Slight wheeze. Good air movement. Cardiovascular regular rate rhythm without murmurs gallops rubs Abdomen soft nontender nondistended Extremities no clubbing cyanosis or edema  Lab Results: Basic Metabolic Panel:  Lab [id number] 03/01/12 2126 02/28/12 0501  NA 136 131* --  K 3.8 3.8 --  CL 105 100 --  CO2 21 20 --  GLUCOSE 113* 151* --  BUN 20 19 --  CREATININE 1.09 1.11* --  CALCIUM 9.0 8.6 --  MG -- -- 1.7  PHOS -- -- --   Liver Function Tests:  Lab 02/27/12 2245  AST 9  ALT 14  ALKPHOS 86  BILITOT 0.3  PROT 7.1  ALBUMIN 4.2   No results found for this basename: LIPASE:2,AMYLASE:2 in the last 168 hours No results found for this basename: AMMONIA:2 in the last 168 hours CBC:  Lab 03/03/12 0436 03/02/12 0422 03/01/12 2126 02/27/12 2245  WBC 9.3 9.3 -- --  NEUTROABS -- -- 8.6* 9.6*  HGB 9.3* 9.2* -- --  HCT 27.9* 27.6* -- --  MCV 91.2 91.1 -- --  PLT 253 214 -- --   Cardiac Enzymes:  Lab 02/29/12 0506 02/28/12 1835 02/28/12 0823    CKTOTAL -- -- --  CKMB -- -- --  CKMBINDEX -- -- --  TROPONINI <0.30 <0.30 0.55*   BNP:  Lab 03/02/12 0854 02/29/12 1945  PROBNP 1823.0* 1447.0*   D-Dimer: No results found for this basename: DDIMER:2 in the last 168 hours CBG:  Lab 03/03/12 0716 03/02/12 2121 03/02/12 1609 03/02/12 1118 03/02/12 0734 03/01/12 2128  GLUCAP 101* 156* 163* 151* 99 153*   Hemoglobin A1C:  Lab 02/29/12 0506  HGBA1C 8.5*   Fasting Lipid Panel:  Lab 02/29/12 0510  CHOL 127  HDL 25*  LDLCALC 24  TRIG 392*  CHOLHDL 5.1  LDLDIRECT --   Thyroid Function Tests:  Lab 02/28/12 0501  TSH 2.000  T4TOTAL --  FREET4 1.35  T3FREE --  THYROIDAB --   Coagulation:  Lab 02/28/12 0822  LABPROT 13.7  INR 1.03   Anemia Panel:  Lab 03/02/12 0854  VITAMINB12 194*  FOLATE >20.0  FERRITIN 433*  TIBC 211*  IRON 16*  RETICCTPCT --   Urine Drug Screen: Drugs of Abuse  No results found for this basename: labopia,  cocainscrnur,  labbenz,  amphetmu,  thcu,  labbarb  Alcohol Level: No results found for this basename: ETH:2 in the last 168 hours Urinalysis:  Lab 02/28/12 0352  COLORURINE YELLOW  LABSPEC 1.010  PHURINE 5.5  GLUCOSEU >1000*  HGBUR NEGATIVE  BILIRUBINUR NEGATIVE  KETONESUR NEGATIVE  PROTEINUR NEGATIVE  UROBILINOGEN 0.2  NITRITE NEGATIVE  LEUKOCYTESUR NEGATIVE   Micro Results: Recent Results (from the past 240 hour(s))  CULTURE, BLOOD (ROUTINE X 2)     Status: Normal (Preliminary result)   Collection Time   02/28/12 10:35 PM      Component Value Range Status Comment   Specimen Description BLOOD LEFT ANTECUBITAL   Final    Special Requests     Final    Value: BOTTLES DRAWN AEROBIC AND ANAEROBIC AEB=15CC ANA=10CC   Culture NO GROWTH 3 DAYS   Final    Report Status PENDING   Incomplete   CULTURE, BLOOD (ROUTINE X 2)     Status: Normal (Preliminary result)   Collection Time   02/28/12 10:39 PM      Component Value Range Status Comment   Specimen Description BLOOD LEFT  HAND   Final    Special Requests     Final    Value: BOTTLES DRAWN AEROBIC AND ANAEROBIC AEB=15CC ANA=12CC   Culture NO GROWTH 3 DAYS   Final    Report Status PENDING   Incomplete   CULTURE, EXPECTORATED SPUTUM-ASSESSMENT     Status: Normal   Collection Time   03/01/12  7:54 AM      Component Value Range Status Comment   Specimen Description SPUTUM EXPECTORATED   Final    Special Requests NONE   Final    Sputum evaluation     Final    Value: THIS SPECIMEN IS ACCEPTABLE. RESPIRATORY CULTURE REPORT TO FOLLOW.     Performed at [facility]   Report Status 03/01/2012 FINAL   Final   CULTURE, RESPIRATORY     Status: Normal (Preliminary result)   Collection Time   03/01/12  7:54 AM      Component Value Range Status Comment   Specimen Description SPUTUM EXPECTORATED   Final    Special Requests NONE   Final    Gram Stain     Final    Value: MODERATE WBC PRESENT,BOTH PMN AND MONONUCLEAR     RARE SQUAMOUS EPITHELIAL CELLS PRESENT     FEW GRAM POSITIVE COCCI     IN PAIRS FEW GRAM NEGATIVE RODS   Culture Culture reincubated for better growth   Final    Report Status PENDING   Incomplete   CLOSTRIDIUM DIFFICILE BY PCR     Status: Normal   Collection Time   03/01/12  1:07 PM      Component Value Range Status Comment   C difficile by pcr NEGATIVE  NEGATIVE Final   MRSA PCR SCREENING     Status: Normal   Collection Time   03/01/12 11:06 PM      Component Value Range Status Comment   MRSA by PCR NEGATIVE  NEGATIVE Final    Studies/Results: Dg Chest Port 1 View  03/01/2012  *RADIOLOGY REPORT*  Clinical Data: Fever and shortness of breath.  Follow-up pneumonia.  PORTABLE CHEST - 1 VIEW  Comparison: Earlier today.  Findings: The cardiac silhouette remains borderline enlarged.  No significant change in previously demonstrated bilateral airspace opacity with no visible pleural fluid other than a tiny amount of fluid in the minor fissure on the right.  There could be some fluid at the left  lung  base, obscured by the airspace opacity.  Thoracic spine degenerative changes.  IMPRESSION: Stable bilateral pneumonia.   Original Report Authenticated By: Gerald Stabs, M.D.    Scheduled Meds:    . atorvastatin  20 mg Oral q1800  . cyanocobalamin  1,000 mcg Intramuscular Daily  . diltiazem  60 mg Oral TID  . furosemide  40 mg Intravenous Once  . furosemide  80 mg Intravenous BID  . heparin  5,000 Units Subcutaneous Q8H  . insulin aspart  0-20 Units Subcutaneous TID WC  . insulin aspart  0-5 Units Subcutaneous QHS  . insulin glargine  65 Units Subcutaneous QHS  . labetalol  400 mg Oral BID  . levalbuterol      . levalbuterol  0.63 mg Nebulization QID  . levothyroxine  137 mcg Oral QAC breakfast  . losartan  100 mg Oral Daily  . metFORMIN  500 mg Oral TID WC  . moxifloxacin  400 mg Oral q1800  . omega-3 acid ethyl esters  1 g Oral BID  . DISCONTD: levalbuterol  0.63 mg Nebulization QID   Continuous Infusions:  PRN Meds:.acetaminophen, guaiFENesin-dextromethorphan, levalbuterol, LORazepam, nitroGLYCERIN, ondansetron (ZOFRAN) IV, polyethylene glycol, sodium chloride, traZODone, DISCONTD: bisacodyl Assessment/Plan: Principal Problem:  *Acute respiratory failure: Likely secondary to pneumonia with bronchospasm. However, pro BNP yesterday 1800 which is slightly higher than previous. Will give Lasix 80 mg twice daily for 3 doses and reassess tomorrow.. Also, patient's weight has increased by 10 pounds since admission.  Ejection fraction normal.  Required BiPAP last night. Will monitor and step down again today. Active Problems:  Community acquired bacterial pneumonia  CAD, NATIVE VESSEL  HYPERTENSION, UNSPECIFIED  Atrial fib/flutter, transient  DM (diabetes mellitus), type 2, improved  Acute non-ST segment elevation myocardial infarction secondary to pneumonia and atrial fibrillation Anemia: No bleeding noted. B12 level low. We'll start B12 injections. I see no report of Hemoccults.  Will reinforce order with nursing staff.  Bronchospasm  HYPERLIPIDEMIA-MIXED  OVERWEIGHT/OBESITY  Stable angina  Hypothyroid. TSH normal.   LOS: 5 days   Virginia Huber L 03/03/2012, 9:37 AM

## 2012-03-04 LAB — CULTURE, BLOOD (ROUTINE X 2): Culture: NO GROWTH

## 2012-03-04 LAB — GLUCOSE, CAPILLARY
Glucose-Capillary: 110 mg/dL — ABNORMAL HIGH (ref 70–99)
Glucose-Capillary: 235 mg/dL — ABNORMAL HIGH (ref 70–99)

## 2012-03-04 MED ORDER — GLUCERNA SHAKE PO LIQD
237.0000 mL | Freq: Three times a day (TID) | ORAL | Status: DC
  Administered 2012-03-04: 237 mL via ORAL

## 2012-03-04 MED ORDER — SODIUM CHLORIDE 0.9 % IJ SOLN
INTRAMUSCULAR | Status: AC
  Administered 2012-03-04: 3 mL
  Filled 2012-03-04: qty 3

## 2012-03-04 MED ORDER — SODIUM CHLORIDE 0.9 % IJ SOLN
INTRAMUSCULAR | Status: AC
  Filled 2012-03-04: qty 6

## 2012-03-04 MED ORDER — VANCOMYCIN HCL 1000 MG IV SOLR
1250.0000 mg | INTRAVENOUS | Status: AC
  Administered 2012-03-04 – 2012-03-07 (×4): 1250 mg via INTRAVENOUS
  Filled 2012-03-04 (×5): qty 1250

## 2012-03-04 MED ORDER — CEFEPIME HCL 1 G IJ SOLR
1.0000 g | Freq: Two times a day (BID) | INTRAMUSCULAR | Status: DC
  Administered 2012-03-04 – 2012-03-08 (×9): 1 g via INTRAVENOUS
  Filled 2012-03-04 (×11): qty 1

## 2012-03-04 NOTE — Telephone Encounter (Signed)
Pt remains an inpatient at [facility]. Will f/u with pt after discharge.

## 2012-03-04 NOTE — Evaluation (Signed)
Physical Therapy Evaluation Patient Details Name: Virginia Huber MRN: [id number] DOB: 01-06-1946 Today's Date: 03/04/2012 Time: 0927-1001 PT Time Calculation (min): 34 min  PT Assessment / Plan / Recommendation Clinical Impression  Pt is seen for eval and found to be mildly deconditioned.  She is able to ambulate with no assistive device for functional distance but has occasional wavering gait.  she might benefit from gait with a cane a t next visit.  We will plan on instruction in gait with a cane and home ex program of closed chain strengthening ex at next visit.  Would recommend outpatient cardiopulmonary rehab  when medically cleared.    PT Assessment  Patient needs continued PT services    Follow Up Recommendations  Supervision - Intermittent;Other (comment) (outpatient cardiopulmonary rehab)    Barriers to Discharge None      Equipment Recommendations  Cane    Recommendations for Other Services     Frequency Min 3X/week    Precautions / Restrictions Precautions Precautions: None Restrictions Weight Bearing Restrictions: No   Pertinent Vitals/Pain       Mobility  Bed Mobility Bed Mobility: Sit to Supine;Supine to Sit Supine to Sit: 6: Modified independent (Device/Increase time) Sit to Supine: 6: Modified independent (Device/Increase time) Transfers Transfers: Sit to Stand;Stand to Sit Sit to Stand: 5: Supervision Stand to Sit: 4: Min guard Ambulation/Gait Ambulation/Gait Assistance: 5: Supervision Ambulation Distance (Feet): 200 Feet Assistive device: None Ambulation/Gait Assistance Details: pt had no LOB but has occasional wavering gait...might benefit from the use of a cane Gait Pattern: Within Functional Limits Gait velocity: WNL Stairs: No Wheelchair Mobility Wheelchair Mobility: No    Exercises Other Exercises Other Exercises: sit to stand Other Exercises: toe raises   PT Diagnosis: Difficulty walking;Generalized weakness  PT Problem List:  Decreased strength;Decreased activity tolerance;Decreased mobility;Decreased balance PT Treatment Interventions: DME instruction;Gait training;Stair training;Therapeutic exercise;Patient/family education   PT Goals Acute Rehab PT Goals PT Goal Formulation: With patient Time For Goal Achievement: 03/18/12 Potential to Achieve Goals: Good Pt will Ambulate: >150 feet;with modified independence;with least restrictive assistive device PT Goal: Ambulate - Progress: Goal set today Pt will Go Up / Down Stairs: 3-5 stairs;with supervision;with rail(s);with cane PT Goal: Up/Down Stairs - Progress: Goal set today Pt will Perform Home Exercise Program: Independently PT Goal: Perform Home Exercise Program - Progress: Goal set today  Visit Information  Last PT Received On: 03/04/12    Subjective Data  Subjective: I'm feeling better Patient Stated Goal: go home   Prior Functioning  Home Living Lives With: Spouse Available Help at Discharge: Family Type of Home: House Home Access: Stairs to enter Technical brewer of Steps: 3 Entrance Stairs-Rails: Right Home Layout: One level Biochemist, clinical: Standard Home Adaptive Equipment: None Prior Function Level of Independence: Independent Able to Take Stairs?: Yes Driving: Yes Vocation: Retired Corporate investment banker: No difficulties    Cognition  Overall Cognitive Status: Appears within functional limits for tasks assessed/performed Arousal/Alertness: Awake/alert Orientation Level: Appears intact for tasks assessed Behavior During Session: [facility] for tasks performed    Extremity/Trunk Assessment Right Upper Extremity Assessment RUE ROM/Strength/Tone: Within functional levels Left Upper Extremity Assessment LUE ROM/Strength/Tone: Within functional levels Right Lower Extremity Assessment RLE ROM/Strength/Tone: WFL for tasks assessed Left Lower Extremity Assessment LLE ROM/Strength/Tone: WFL for tasks assessed   Balance  Balance Balance Assessed: Yes Dynamic Standing Balance Dynamic Standing - Balance Support: No upper extremity supported Dynamic Standing - Level of Assistance: 5: Stand by assistance  End of Session PT - End  of Session Equipment Utilized During Treatment: Gait belt Activity Tolerance: Patient tolerated treatment well Patient left: in bed;with call bell/phone within reach;with nursing in room;with family/visitor present Nurse Communication: Mobility status  GP     Sable Feil 03/04/2012, 10:09 AM

## 2012-03-04 NOTE — Progress Notes (Signed)
Subjective: Was placed on bipap last night, but not clear why.  Patient reports that she did not feel terribly short of breath. Patient however still has a lot of wheezing and chest congestion. She is weak. Poor appetite.  Objective: Vital signs in last 24 hours: Filed Vitals:   03/04/12 0458 03/04/12 0500 03/04/12 0600 03/04/12 0824  BP:  123/52 132/53   Pulse:  69 71   Temp: 98.7 F (37.1 C)   97.5 F (36.4 C)  TempSrc: Oral   Oral  Resp:  29 27   Height:      Weight: 68.4 kg (150 lb 12.7 oz)     SpO2:  94% 94%    Weight change: -2 kg (-4 lb 6.6 oz)  Intake/Output Summary (Last 24 hours) at 03/04/12 0840 Last data filed at 03/04/12 0400  Gross per 24 hour  Intake    856 ml  Output   1104 ml  Net   -248 ml   Telemetry: Normal sinus rhythm  General: Alert, oriented, appears tired. No respiratory distress noted Lungs: No rales. No rhonchi. Slight wheeze. Good air movement. Cardiovascular regular rate rhythm without murmurs gallops rubs Abdomen soft nontender nondistended Extremities no clubbing cyanosis or edema  Lab Results: Basic Metabolic Panel:  Lab [id number] 03/01/12 2126 02/28/12 0501  NA 136 131* --  K 3.8 3.8 --  CL 105 100 --  CO2 21 20 --  GLUCOSE 113* 151* --  BUN 20 19 --  CREATININE 1.09 1.11* --  CALCIUM 9.0 8.6 --  MG -- -- 1.7  PHOS -- -- --   Liver Function Tests:  Lab 02/27/12 2245  AST 9  ALT 14  ALKPHOS 86  BILITOT 0.3  PROT 7.1  ALBUMIN 4.2   No results found for this basename: LIPASE:2,AMYLASE:2 in the last 168 hours No results found for this basename: AMMONIA:2 in the last 168 hours CBC:  Lab 03/03/12 0436 03/02/12 0422 03/01/12 2126 02/27/12 2245  WBC 9.3 9.3 -- --  NEUTROABS -- -- 8.6* 9.6*  HGB 9.3* 9.2* -- --  HCT 27.9* 27.6* -- --  MCV 91.2 91.1 -- --  PLT 253 214 -- --   Cardiac Enzymes:  Lab 02/29/12 0506 02/28/12 1835 02/28/12 0823  CKTOTAL -- -- --  CKMB -- -- --  CKMBINDEX -- -- --  TROPONINI <0.30  <0.30 0.55*   BNP:  Lab 03/02/12 0854 02/29/12 1945  PROBNP 1823.0* 1447.0*   D-Dimer: No results found for this basename: DDIMER:2 in the last 168 hours CBG:  Lab 03/04/12 0802 03/03/12 2056 03/03/12 1617 03/03/12 1116 03/03/12 0716 03/02/12 2121  GLUCAP 110* 147* 120* 134* 101* 156*   Hemoglobin A1C:  Lab 02/29/12 0506  HGBA1C 8.5*   Fasting Lipid Panel:  Lab 02/29/12 0510  CHOL 127  HDL 25*  LDLCALC 24  TRIG 392*  CHOLHDL 5.1  LDLDIRECT --   Thyroid Function Tests:  Lab 02/28/12 0501  TSH 2.000  T4TOTAL --  FREET4 1.35  T3FREE --  THYROIDAB --   Coagulation:  Lab 02/28/12 0822  LABPROT 13.7  INR 1.03   Anemia Panel:  Lab 03/02/12 0854  VITAMINB12 194*  FOLATE >20.0  FERRITIN 433*  TIBC 211*  IRON 16*  RETICCTPCT --   Urine Drug Screen: Drugs of Abuse  No results found for this basename: labopia,  cocainscrnur,  labbenz,  amphetmu,  thcu,  labbarb    Alcohol Level: No results found for this basename: ETH:2 in  the last 168 hours Urinalysis:  Lab 02/28/12 0352  COLORURINE YELLOW  LABSPEC 1.010  PHURINE 5.5  GLUCOSEU >1000*  HGBUR NEGATIVE  BILIRUBINUR NEGATIVE  KETONESUR NEGATIVE  PROTEINUR NEGATIVE  UROBILINOGEN 0.2  NITRITE NEGATIVE  LEUKOCYTESUR NEGATIVE   Micro Results: Recent Results (from the past 240 hour(s))  CULTURE, BLOOD (ROUTINE X 2)     Status: Normal (Preliminary result)   Collection Time   02/28/12 10:35 PM      Component Value Range Status Comment   Specimen Description BLOOD LEFT ANTECUBITAL   Final    Special Requests     Final    Value: BOTTLES DRAWN AEROBIC AND ANAEROBIC AEB=15CC ANA=10CC   Culture NO GROWTH 4 DAYS   Final    Report Status PENDING   Incomplete   CULTURE, BLOOD (ROUTINE X 2)     Status: Normal (Preliminary result)   Collection Time   02/28/12 10:39 PM      Component Value Range Status Comment   Specimen Description BLOOD LEFT HAND   Final    Special Requests     Final    Value: BOTTLES DRAWN  AEROBIC AND ANAEROBIC AEB=15CC ANA=12CC   Culture NO GROWTH 4 DAYS   Final    Report Status PENDING   Incomplete   CULTURE, EXPECTORATED SPUTUM-ASSESSMENT     Status: Normal   Collection Time   03/01/12  7:54 AM      Component Value Range Status Comment   Specimen Description SPUTUM EXPECTORATED   Final    Special Requests NONE   Final    Sputum evaluation     Final    Value: THIS SPECIMEN IS ACCEPTABLE. RESPIRATORY CULTURE REPORT TO FOLLOW.     Performed at [facility]   Report Status 03/01/2012 FINAL   Final   CULTURE, RESPIRATORY     Status: Normal   Collection Time   03/01/12  7:54 AM      Component Value Range Status Comment   Specimen Description SPUTUM EXPECTORATED   Final    Special Requests NONE   Final    Gram Stain     Final    Value: MODERATE WBC PRESENT,BOTH PMN AND MONONUCLEAR     RARE SQUAMOUS EPITHELIAL CELLS PRESENT     FEW GRAM POSITIVE COCCI     IN PAIRS FEW GRAM NEGATIVE RODS   Culture NORMAL OROPHARYNGEAL FLORA   Final    Report Status 03/03/2012 FINAL   Final   CLOSTRIDIUM DIFFICILE BY PCR     Status: Normal   Collection Time   03/01/12  1:07 PM      Component Value Range Status Comment   C difficile by pcr NEGATIVE  NEGATIVE Final   MRSA PCR SCREENING     Status: Normal   Collection Time   03/01/12 11:06 PM      Component Value Range Status Comment   MRSA by PCR NEGATIVE  NEGATIVE Final    Studies/Results: No results found. Scheduled Meds:    . atorvastatin  20 mg Oral q1800  . ceFEPime (MAXIPIME) IV  1 g Intravenous Q12H  . cyanocobalamin  1,000 mcg Intramuscular Daily  . diltiazem  60 mg Oral TID  . feeding supplement  237 mL Oral TID BM  . furosemide  80 mg Intravenous BID  . heparin  5,000 Units Subcutaneous Q8H  . insulin aspart  0-20 Units Subcutaneous TID WC  . insulin aspart  0-5 Units Subcutaneous QHS  . insulin glargine  65 Units Subcutaneous QHS  . labetalol  400 mg Oral BID  . levalbuterol  0.63 mg Nebulization QID  .  levothyroxine  137 mcg Oral QAC breakfast  . losartan  100 mg Oral Daily  . metFORMIN  500 mg Oral TID WC  . omega-3 acid ethyl esters  1 g Oral BID  . sodium chloride      . DISCONTD: moxifloxacin  400 mg Oral q1800   Continuous Infusions:  PRN Meds:.acetaminophen, guaiFENesin-dextromethorphan, levalbuterol, nitroGLYCERIN, ondansetron (ZOFRAN) IV, polyethylene glycol, sodium chloride, traZODone, DISCONTD: bisacodyl Assessment/Plan: Principal Problem:  *Acute respiratory failure: Likely multifactorial, secondary to pneumonia with bronchospasm as well as fluid overload. Intake and output put do not appear to be complete. Patient's weight has decreased by 5 pounds since yesterday. She reports good urine output on the higher dose of Lasix. Will get another dose this morning then reassess further need tomorrow. Will recheck pro BNP tomorrow.   Ejection fraction normal.  Discontinue BiPAP order, as it's not clear what the indication was for it last night. Will monitor and step down again today. Active Problems:  Community acquired bacterial pneumonia, slow to improve. Patient has had Avelox for 5 days which should more than cover atypicals. Urine strep pneumonia and urine Legionella negative. Will change to cefepime and vancomycin in case she has a MRSA or Pseudomonas infection. PT evaluation pending. Patient needs to get up and mobilize.  CAD, NATIVE VESSEL  HYPERTENSION, UNSPECIFIED  Atrial fib/flutter, transient, likely related to pulmonary issues. Ejection fraction normal. No anti-coagulation needed.  DM (diabetes mellitus), type 2, improved  Acute non-ST segment elevation myocardial infarction secondary to pneumonia and atrial fibrillation Anemia: No bleeding noted. B12 level low. We'll start B12 injections. I see no report of Hemoccults. Will reinforce order with nursing staff.  Bronchospasm, continue bronchodilators. Would hold off on steroids, do to her diabetes and fluid overload.   HYPERLIPIDEMIA-MIXED  OVERWEIGHT/OBESITY  Stable angina  Hypothyroid. TSH normal.   LOS: 6 days   Delorus Langwell L 03/04/2012, 8:40 AM

## 2012-03-04 NOTE — Progress Notes (Signed)
ANTIBIOTIC CONSULT NOTE - INITIAL  Pharmacy Consult for Vancomycin Indication: rule out pneumonia  Allergies  Allergen Reactions  . Penicillins Hives  . Percocet (Oxycodone-Acetaminophen) Nausea And Vomiting    Patient Measurements: Height: 5\' 3"  (160 cm) Weight: 150 lb 12.7 oz (68.4 kg) IBW/kg (Calculated) : 52.4   Vital Signs: Temp: 97.5 F (36.4 C) (09/13 0824) Temp src: Oral (09/13 0824) BP: 132/53 mmHg (09/13 0600) Pulse Rate: 71  (09/13 0600) Intake/Output from previous day: 09/12 0701 - 09/13 0700 In: 1336 [P.O.:1320; IV Piggyback:16] Out: 1104 [Urine:1100; Stool:4] Intake/Output from this shift:    Labs:  [facility] 03/03/12 0436 03/02/12 0422 03/01/12 2126  WBC 9.3 9.3 10.8*  HGB 9.3* 9.2* 9.8*  PLT 253 214 207  LABCREA -- -- --  CREATININE 1.09 -- 1.11*   Estimated Creatinine Clearance: 47.1 ml/min (by C-G formula based on Cr of 1.09). No results found for this basename: VANCOTROUGH:2,VANCOPEAK:2,VANCORANDOM:2,GENTTROUGH:2,GENTPEAK:2,GENTRANDOM:2,TOBRATROUGH:2,TOBRAPEAK:2,TOBRARND:2,AMIKACINPEAK:2,AMIKACINTROU:2,AMIKACIN:2, in the last 72 hours   Microbiology: Recent Results (from the past 720 hour(s))  CULTURE, BLOOD (ROUTINE X 2)     Status: Normal (Preliminary result)   Collection Time   02/28/12 10:35 PM      Component Value Range Status Comment   Specimen Description BLOOD LEFT ANTECUBITAL   Final    Special Requests     Final    Value: BOTTLES DRAWN AEROBIC AND ANAEROBIC AEB=15CC ANA=10CC   Culture NO GROWTH 4 DAYS   Final    Report Status PENDING   Incomplete   CULTURE, BLOOD (ROUTINE X 2)     Status: Normal (Preliminary result)   Collection Time   02/28/12 10:39 PM      Component Value Range Status Comment   Specimen Description BLOOD LEFT HAND   Final    Special Requests     Final    Value: BOTTLES DRAWN AEROBIC AND ANAEROBIC AEB=15CC ANA=12CC   Culture NO GROWTH 4 DAYS   Final    Report Status PENDING   Incomplete   CULTURE, EXPECTORATED  SPUTUM-ASSESSMENT     Status: Normal   Collection Time   03/01/12  7:54 AM      Component Value Range Status Comment   Specimen Description SPUTUM EXPECTORATED   Final    Special Requests NONE   Final    Sputum evaluation     Final    Value: THIS SPECIMEN IS ACCEPTABLE. RESPIRATORY CULTURE REPORT TO FOLLOW.     Performed at [facility]   Report Status 03/01/2012 FINAL   Final   CULTURE, RESPIRATORY     Status: Normal   Collection Time   03/01/12  7:54 AM      Component Value Range Status Comment   Specimen Description SPUTUM EXPECTORATED   Final    Special Requests NONE   Final    Gram Stain     Final    Value: MODERATE WBC PRESENT,BOTH PMN AND MONONUCLEAR     RARE SQUAMOUS EPITHELIAL CELLS PRESENT     FEW GRAM POSITIVE COCCI     IN PAIRS FEW GRAM NEGATIVE RODS   Culture NORMAL OROPHARYNGEAL FLORA   Final    Report Status 03/03/2012 FINAL   Final   CLOSTRIDIUM DIFFICILE BY PCR     Status: Normal   Collection Time   03/01/12  1:07 PM      Component Value Range Status Comment   C difficile by pcr NEGATIVE  NEGATIVE Final   MRSA PCR SCREENING     Status: Normal  Collection Time   03/01/12 11:06 PM      Component Value Range Status Comment   MRSA by PCR NEGATIVE  NEGATIVE Final    Medical History: Past Medical History  Diagnosis Date  . HTN (hypertension)   . DM (diabetes mellitus)   . Heart block   . Hypothyroidism   . Ovarian tumor   . Lupus   . S/P colonoscopy 2005    Dr. Laural Golden: pancolonic divericula, polyp, path unknown currently  . Coronary artery disease    Medications:  Scheduled:    . atorvastatin  20 mg Oral q1800  . ceFEPime (MAXIPIME) IV  1 g Intravenous Q12H  . cyanocobalamin  1,000 mcg Intramuscular Daily  . diltiazem  60 mg Oral TID  . feeding supplement  237 mL Oral TID BM  . furosemide  80 mg Intravenous BID  . heparin  5,000 Units Subcutaneous Q8H  . insulin aspart  0-20 Units Subcutaneous TID WC  . insulin aspart  0-5 Units Subcutaneous  QHS  . insulin glargine  65 Units Subcutaneous QHS  . labetalol  400 mg Oral BID  . levalbuterol  0.63 mg Nebulization QID  . levothyroxine  137 mcg Oral QAC breakfast  . losartan  100 mg Oral Daily  . metFORMIN  500 mg Oral TID WC  . omega-3 acid ethyl esters  1 g Oral BID  . sodium chloride      . vancomycin  1,250 mg Intravenous Q24H  . DISCONTD: moxifloxacin  400 mg Oral q1800   Assessment: 66yo female with SOB, rule out pna.  Estimated Creatinine Clearance: 47.1 ml/min (by C-G formula based on Cr of 1.09).  Goal of Therapy:  Vancomycin trough level 15-20 mcg/ml Eradicate infection.  Plan: Vancomycin 1250mg  iv q24hrs Check trough at steady state.  Monitor renal fxn and cultures.  Hart Robinsons A 03/04/2012,8:46 AM

## 2012-03-05 ENCOUNTER — Inpatient Hospital Stay (HOSPITAL_COMMUNITY): Payer: Medicare Other

## 2012-03-05 DIAGNOSIS — J159 Unspecified bacterial pneumonia: Secondary | ICD-10-CM

## 2012-03-05 DIAGNOSIS — J9600 Acute respiratory failure, unspecified whether with hypoxia or hypercapnia: Secondary | ICD-10-CM

## 2012-03-05 DIAGNOSIS — E876 Hypokalemia: Secondary | ICD-10-CM

## 2012-03-05 DIAGNOSIS — D518 Other vitamin B12 deficiency anemias: Secondary | ICD-10-CM

## 2012-03-05 HISTORY — DX: Hypokalemia: E87.6

## 2012-03-05 LAB — BASIC METABOLIC PANEL
BUN: 14 mg/dL (ref 6–23)
Creatinine, Ser: 0.86 mg/dL (ref 0.50–1.10)
GFR calc Af Amer: 80 mL/min — ABNORMAL LOW (ref >90)
GFR calc non Af Amer: 69 mL/min — ABNORMAL LOW (ref >90)
Potassium: 3.3 mEq/L — ABNORMAL LOW (ref 3.5–5.1)

## 2012-03-05 LAB — GLUCOSE, CAPILLARY: Glucose-Capillary: 195 mg/dL — ABNORMAL HIGH (ref 70–99)

## 2012-03-05 LAB — PRO B NATRIURETIC PEPTIDE: Pro B Natriuretic peptide (BNP): 1047 pg/mL — ABNORMAL HIGH (ref 0–125)

## 2012-03-05 MED ORDER — POTASSIUM CHLORIDE CRYS ER 20 MEQ PO TBCR
20.0000 meq | EXTENDED_RELEASE_TABLET | Freq: Two times a day (BID) | ORAL | Status: AC
  Administered 2012-03-05 (×2): 20 meq via ORAL
  Filled 2012-03-05 (×2): qty 1

## 2012-03-05 MED ORDER — ASPIRIN 81 MG PO CHEW
162.0000 mg | CHEWABLE_TABLET | Freq: Every day | ORAL | Status: DC
  Administered 2012-03-05 – 2012-03-08 (×4): 162 mg via ORAL
  Filled 2012-03-05 (×4): qty 2

## 2012-03-05 NOTE — Progress Notes (Signed)
-*  PT TRANSFERRED TO ROOM 202 ON TELEMETRY. ALERT AND ORIENTED. O2 AT 4L/MIN VIA [place]. NO SOB OR RESPIRATORY DISTRESS. LT FOREARM IV IS NSL AND PATENT. FAMILY MEMBER AT BEDSIDE. THIS NURSE WILL CONTINUE TO PROVIDE NURSING CARE FOR THIS PT FOR THE REST OF THIS SHIFT.

## 2012-03-05 NOTE — Progress Notes (Signed)
Subjective: The patient says that she's feeling a lot better this morning. No complaints of chest pain or chest congestion at rest.  Objective: Vital signs in last 24 hours: Filed Vitals:   03/05/12 0300 03/05/12 0400 03/05/12 0500 03/05/12 0600  BP: 113/44 122/49 127/59 131/70  Pulse: 55 57 59 60  Temp:  98 F (36.7 C)    TempSrc:  Oral    Resp: 22 25 23 22   Height:      Weight:   69.4 kg (153 lb)   SpO2: 96% 96% 96% 96%    Intake/Output Summary (Last 24 hours) at 03/05/12 0756 Last data filed at 03/05/12 0600  Gross per 24 hour  Intake   1310 ml  Output   3150 ml  Net  -1840 ml    Weight change: 1 kg (2 lb 3.3 oz)  Physical exam: General: Pleasant 66 year old Caucasian woman sitting up in bed, in no acute distress. Lungs: Occasional wheezes, no crackles or rhonchi. Breathing is nonlabored. Heart: S1, S2, no murmurs rubs or gallops. Abdomen: Positive bowel sounds, soft, nontender, nondistended. Extremities: No pedal edema. Neurologic: She is alert and oriented x3. Cranial nerves II through XII are intact.  Lab Results: Basic Metabolic Panel:  Basename 03/05/12 0502 03/03/12 0436  NA 137 136  K 3.3* 3.8  CL 102 105  CO2 25 21  GLUCOSE 154* 113*  BUN 14 20  CREATININE 0.86 1.09  CALCIUM 8.6 9.0  MG -- --  PHOS -- --   Liver Function Tests: No results found for this basename: AST:2,ALT:2,ALKPHOS:2,BILITOT:2,PROT:2,ALBUMIN:2 in the last 72 hours No results found for this basename: LIPASE:2,AMYLASE:2 in the last 72 hours No results found for this basename: AMMONIA:2 in the last 72 hours CBC:  Basename 03/03/12 0436  WBC 9.3  NEUTROABS --  HGB 9.3*  HCT 27.9*  MCV 91.2  PLT 253   Cardiac Enzymes: No results found for this basename: CKTOTAL:3,CKMB:3,CKMBINDEX:3,TROPONINI:3 in the last 72 hours BNP:  Basename 03/05/12 0502 03/02/12 0854  PROBNP 1047.0* 1823.0*   D-Dimer: No results found for this basename: DDIMER:2 in the last 72  hours CBG:  Basename 03/05/12 0723 03/04/12 2048 03/04/12 1631 03/04/12 1137 03/04/12 0802 03/03/12 2056  GLUCAP 129* 191* 169* 235* 110* 147*   Hemoglobin A1C: No results found for this basename: HGBA1C in the last 72 hours Fasting Lipid Panel: No results found for this basename: CHOL,HDL,LDLCALC,TRIG,CHOLHDL,LDLDIRECT in the last 72 hours Thyroid Function Tests: No results found for this basename: TSH,T4TOTAL,FREET4,T3FREE,THYROIDAB in the last 72 hours Anemia Panel:  Basename 03/02/12 0854  VITAMINB12 194*  FOLATE >20.0  FERRITIN 433*  TIBC 211*  IRON 16*  RETICCTPCT --   Coagulation: No results found for this basename: LABPROT:2,INR:2 in the last 72 hours Urine Drug Screen: Drugs of Abuse  No results found for this basename: labopia, cocainscrnur, labbenz, amphetmu, thcu, labbarb    Alcohol Level: No results found for this basename: ETH:2 in the last 72 hours Urinalysis: No results found for this basename: COLORURINE:2,APPERANCEUR:2,LABSPEC:2,PHURINE:2,GLUCOSEU:2,HGBUR:2,BILIRUBINUR:2,KETONESUR:2,PROTEINUR:2,UROBILINOGEN:2,NITRITE:2,LEUKOCYTESUR:2 in the last 72 hours Misc. Labs:  2-D echocardiogram:Study Conclusions  - Left ventricle: The cavity size was normal. There was moderate concentric hypertrophy. Systolic function was normal. The estimated ejection fraction was in the range of 55% to 60%. Wall motion was normal; there were no regional wall motion abnormalities. - Left atrium: The atrium was mildly dilated. - Atrial septum: No defect or patent foramen ovale was identified. Impressions:  - Compared to the prior study 05/23/08, there has been no  significant interval change. Transthoracic echocardiography. M-mode, complete 2D, spectral Doppler, and color Doppler. Height: Height: 160cm. Height: 63in. Weight: Weight: 69.9kg. Weight: 153.7lb. Body mass index: BMI: 27.3kg/m^2. Body surface area: BSA: 1.48m^2. Patient status: Inpatient. Location:  Bedside.      Micro: Recent Results (from the past 240 hour(s))  CULTURE, BLOOD (ROUTINE X 2)     Status: Normal   Collection Time   02/28/12 10:35 PM      Component Value Range Status Comment   Specimen Description BLOOD LEFT ANTECUBITAL   Final    Special Requests     Final    Value: BOTTLES DRAWN AEROBIC AND ANAEROBIC AEB=15CC ANA=10CC   Culture NO GROWTH 5 DAYS   Final    Report Status 03/04/2012 FINAL   Final   CULTURE, BLOOD (ROUTINE X 2)     Status: Normal   Collection Time   02/28/12 10:39 PM      Component Value Range Status Comment   Specimen Description BLOOD LEFT HAND   Final    Special Requests     Final    Value: BOTTLES DRAWN AEROBIC AND ANAEROBIC AEB=15CC ANA=12CC   Culture NO GROWTH 5 DAYS   Final    Report Status 03/04/2012 FINAL   Final   CULTURE, EXPECTORATED SPUTUM-ASSESSMENT     Status: Normal   Collection Time   03/01/12  7:54 AM      Component Value Range Status Comment   Specimen Description SPUTUM EXPECTORATED   Final    Special Requests NONE   Final    Sputum evaluation     Final    Value: THIS SPECIMEN IS ACCEPTABLE. RESPIRATORY CULTURE REPORT TO FOLLOW.     Performed at [facility]   Report Status 03/01/2012 FINAL   Final   CULTURE, RESPIRATORY     Status: Normal   Collection Time   03/01/12  7:54 AM      Component Value Range Status Comment   Specimen Description SPUTUM EXPECTORATED   Final    Special Requests NONE   Final    Gram Stain     Final    Value: MODERATE WBC PRESENT,BOTH PMN AND MONONUCLEAR     RARE SQUAMOUS EPITHELIAL CELLS PRESENT     FEW GRAM POSITIVE COCCI     IN PAIRS FEW GRAM NEGATIVE RODS   Culture NORMAL OROPHARYNGEAL FLORA   Final    Report Status 03/03/2012 FINAL   Final   CLOSTRIDIUM DIFFICILE BY PCR     Status: Normal   Collection Time   03/01/12  1:07 PM      Component Value Range Status Comment   C difficile by pcr NEGATIVE  NEGATIVE Final   MRSA PCR SCREENING     Status: Normal   Collection Time   03/01/12  11:06 PM      Component Value Range Status Comment   MRSA by PCR NEGATIVE  NEGATIVE Final     Studies/Results: No results found.  Medications:  Scheduled:   . aspirin  162 mg Oral Daily  . atorvastatin  20 mg Oral q1800  . ceFEPime (MAXIPIME) IV  1 g Intravenous Q12H  . cyanocobalamin  1,000 mcg Intramuscular Daily  . diltiazem  60 mg Oral TID  . feeding supplement  237 mL Oral TID BM  . furosemide  80 mg Intravenous BID  . heparin  5,000 Units Subcutaneous Q8H  . insulin aspart  0-20 Units Subcutaneous TID WC  . insulin aspart  0-5  Units Subcutaneous QHS  . insulin glargine  65 Units Subcutaneous QHS  . labetalol  400 mg Oral BID  . levalbuterol  0.63 mg Nebulization QID  . levothyroxine  137 mcg Oral QAC breakfast  . losartan  100 mg Oral Daily  . metFORMIN  500 mg Oral TID WC  . omega-3 acid ethyl esters  1 g Oral BID  . potassium chloride  20 mEq Oral BID  . sodium chloride      . vancomycin  1,250 mg Intravenous Q24H  . DISCONTD: moxifloxacin  400 mg Oral q1800   Continuous:  [id number], guaiFENesin-dextromethorphan, levalbuterol, nitroGLYCERIN, ondansetron (ZOFRAN) IV, polyethylene glycol, sodium chloride, traZODone  Assessment: Principal Problem:  *Acute respiratory failure Active Problems:  HYPERLIPIDEMIA-MIXED  CAD, NATIVE VESSEL  OVERWEIGHT/OBESITY  HYPERTENSION, UNSPECIFIED  Atrial fib/flutter, transient  Stable angina  Hypothyroid  DM (diabetes mellitus), type 2, uncontrolled  Acute non-ST segment elevation myocardial infarction  Community acquired bacterial pneumonia  B12 deficiency anemia  Bronchospasm  Hypokalemia   1. Acute respiratory failure with hypoxia, secondary to pneumonia with concomitant acute bronchitis and possible fluid overload, however, no new pulmonary edema was seen on the followup chest x-ray. She was given IV Lasix x3 doses. She is symptomatically improved.  Pneumonia/acute bronchitis. Improving clinically. We'll  continue vancomycin, cefepime, and Xopenex. Steroids are being avoided because of recent myocardial ischemia and volume overload.  Acute non-ST elevation myocardial infarction versus elevation of troponin I from demand ischemia/in the setting of known CAD. Cardiology's assessment and recommendations noted and appreciated. We'll continue cardiac medications. Status post IV heparin.   Transient atrial fibrillation/atrial flutter. Resolved. Likely secondary to pulmonary process. Per cardiology, anticoagulation is not indicated in the setting of acute pulmonary process and normal LV function. She has been maintaining normal sinus rhythm.  Vitamin B12 deficiency anemia. Also concomitant iron deficiency anemia. Vitamin B12 injections have been started. We'll hold off on iron supplementation until discharge. Of note, she has a history of diverticulosis and colon polyps. No active GI bleeding during the hospitalization.  Type 2 diabetes mellitus. And delivery blood glucose has been reasonable here. Her hemoglobin A1c is 8.5.  Hypokalemia. Likely secondary to Lasix. Will supplement.  Plan:  1. Start potassium chloride supplementation. 2. Will recheck another chest x-ray to assess for interval findings. 3. Will hold off on starting oral Lasix. 4. Will restart aspirin. 5. We'll transfer to telemetry.   LOS: 7 days   Saahil Herbster 03/05/2012, 7:56 AM

## 2012-03-06 LAB — BASIC METABOLIC PANEL
CO2: 26 mEq/L (ref 19–32)
Chloride: 103 mEq/L (ref 96–112)
Sodium: 137 mEq/L (ref 135–145)

## 2012-03-06 LAB — CBC
HCT: 30.5 % — ABNORMAL LOW (ref 36.0–46.0)
Hemoglobin: 10.3 g/dL — ABNORMAL LOW (ref 12.0–15.0)
MCV: 90.8 fL (ref 78.0–100.0)
RBC: 3.36 MIL/uL — ABNORMAL LOW (ref 3.87–5.11)
WBC: 10.8 10*3/uL — ABNORMAL HIGH (ref 4.0–10.5)

## 2012-03-06 LAB — GLUCOSE, CAPILLARY: Glucose-Capillary: 183 mg/dL — ABNORMAL HIGH (ref 70–99)

## 2012-03-06 MED ORDER — HYDROCOD POLST-CHLORPHEN POLST 10-8 MG/5ML PO LQCR
5.0000 mL | Freq: Every day | ORAL | Status: DC
  Administered 2012-03-06 – 2012-03-07 (×2): 5 mL via ORAL
  Filled 2012-03-06 (×2): qty 5

## 2012-03-06 MED ORDER — BENZONATATE 100 MG PO CAPS
100.0000 mg | ORAL_CAPSULE | Freq: Three times a day (TID) | ORAL | Status: DC
  Administered 2012-03-06 – 2012-03-08 (×7): 100 mg via ORAL
  Filled 2012-03-06 (×7): qty 1

## 2012-03-06 MED ORDER — LEVALBUTEROL HCL 0.63 MG/3ML IN NEBU
0.6300 mg | INHALATION_SOLUTION | RESPIRATORY_TRACT | Status: DC
  Administered 2012-03-06 – 2012-03-08 (×12): 0.63 mg via RESPIRATORY_TRACT
  Filled 2012-03-06 (×12): qty 3

## 2012-03-06 NOTE — Plan of Care (Signed)
Problem: Phase III Progression Outcomes Goal: Activity at appropriate level-compared to baseline (UP IN CHAIR FOR HEMODIALYSIS)  Outcome: Completed/Met Date Met:  03/06/12 Up to chair in room today.  Ambulated around in room and to bathroom to get washed up.

## 2012-03-06 NOTE — Progress Notes (Signed)
Subjective: The patient says that she doesn't feel quite as well as she did yesterday. She was coughing all night. Her cough has been wet but nonproductive.  Objective: Vital signs in last 24 hours: Filed Vitals:   03/05/12 1814 03/05/12 2139 03/06/12 0646 03/06/12 0743  BP:  151/77 152/80   Pulse:  69 68   Temp:  98.5 F (36.9 C) 98.4 F (36.9 C)   TempSrc:  Oral Oral   Resp:  20 22   Height:      Weight:   69.219 kg (152 lb 9.6 oz)   SpO2: 95% 94% 94% 98%    Intake/Output Summary (Last 24 hours) at 03/06/12 0943 Last data filed at 03/05/12 2350  Gross per 24 hour  Intake   1260 ml  Output      0 ml  Net   1260 ml    Weight change: -0.181 kg (-6.4 oz)  Physical exam: General: Pleasant 66 year old Caucasian woman sitting up in chair, in no acute distress. Lungs: Occasional wheezes and crackles. Breathing is nonlabored. Heart: S1, S2, no murmurs rubs or gallops. Abdomen: Positive bowel sounds, soft, nontender, nondistended. Extremities: No pedal edema. Neurologic: She is alert and oriented x3. Cranial nerves II through XII are intact.  Lab Results: Basic Metabolic Panel:  Basename 03/06/12 0640 03/05/12 0502  NA 137 137  K 3.8 3.3*  CL 103 102  CO2 26 25  GLUCOSE 112* 154*  BUN 10 14  CREATININE 0.81 0.86  CALCIUM 9.3 8.6  MG -- --  PHOS -- --   Liver Function Tests: No results found for this basename: AST:2,ALT:2,ALKPHOS:2,BILITOT:2,PROT:2,ALBUMIN:2 in the last 72 hours No results found for this basename: LIPASE:2,AMYLASE:2 in the last 72 hours No results found for this basename: AMMONIA:2 in the last 72 hours CBC:  Basename 03/06/12 0640  WBC 10.8*  NEUTROABS --  HGB 10.3*  HCT 30.5*  MCV 90.8  PLT 327   Cardiac Enzymes: No results found for this basename: CKTOTAL:3,CKMB:3,CKMBINDEX:3,TROPONINI:3 in the last 72 hours BNP:  [facility] 03/05/12 0502  PROBNP 1047.0*   D-Dimer: No results found for this basename: DDIMER:2 in the last 72  hours CBG:  Basename 03/06/12 0722 03/05/12 2308 03/05/12 1637 03/05/12 1122 03/05/12 0723 03/04/12 2048  GLUCAP 108* 126* 150* 195* 129* 191*   Hemoglobin A1C: No results found for this basename: HGBA1C in the last 72 hours Fasting Lipid Panel: No results found for this basename: CHOL,HDL,LDLCALC,TRIG,CHOLHDL,LDLDIRECT in the last 72 hours Thyroid Function Tests: No results found for this basename: TSH,T4TOTAL,FREET4,T3FREE,THYROIDAB in the last 72 hours Anemia Panel: No results found for this basename: VITAMINB12,FOLATE,FERRITIN,TIBC,IRON,RETICCTPCT in the last 72 hours Coagulation: No results found for this basename: LABPROT:2,INR:2 in the last 72 hours Urine Drug Screen: Drugs of Abuse  No results found for this basename: labopia,  cocainscrnur,  labbenz,  amphetmu,  thcu,  labbarb    Alcohol Level: No results found for this basename: ETH:2 in the last 72 hours Urinalysis: No results found for this basename: COLORURINE:2,APPERANCEUR:2,LABSPEC:2,PHURINE:2,GLUCOSEU:2,HGBUR:2,BILIRUBINUR:2,KETONESUR:2,PROTEINUR:2,UROBILINOGEN:2,NITRITE:2,LEUKOCYTESUR:2 in the last 72 hours Misc. Labs:  2-D echocardiogram:Study Conclusions:   - Left ventricle: The cavity size was normal. There was moderate concentric hypertrophy. Systolic function was normal. The estimated ejection fraction was in the range of 55% to 60%. Wall motion was normal; there were no regional wall motion abnormalities. - Left atrium: The atrium was mildly dilated. - Atrial septum: No defect or patent foramen ovale was identified. Impressions:  - Compared to the prior study 05/23/08, there has been  no significant interval change. Transthoracic echocardiography. M-mode, complete 2D, spectral Doppler, and color Doppler. Height: Height: 160cm. Height: 63in. Weight: Weight: 69.9kg. Weight: 153.7lb. Body mass index: BMI: 27.3kg/m^2. Body surface area: BSA: 1.14m^2. Patient status: Inpatient. Location:  Bedside.      Micro: Recent Results (from the past 240 hour(s))  CULTURE, BLOOD (ROUTINE X 2)     Status: Normal   Collection Time   02/28/12 10:35 PM      Component Value Range Status Comment   Specimen Description BLOOD LEFT ANTECUBITAL   Final    Special Requests     Final    Value: BOTTLES DRAWN AEROBIC AND ANAEROBIC AEB=15CC ANA=10CC   Culture NO GROWTH 5 DAYS   Final    Report Status 03/04/2012 FINAL   Final   CULTURE, BLOOD (ROUTINE X 2)     Status: Normal   Collection Time   02/28/12 10:39 PM      Component Value Range Status Comment   Specimen Description BLOOD LEFT HAND   Final    Special Requests     Final    Value: BOTTLES DRAWN AEROBIC AND ANAEROBIC AEB=15CC ANA=12CC   Culture NO GROWTH 5 DAYS   Final    Report Status 03/04/2012 FINAL   Final   CULTURE, EXPECTORATED SPUTUM-ASSESSMENT     Status: Normal   Collection Time   03/01/12  7:54 AM      Component Value Range Status Comment   Specimen Description SPUTUM EXPECTORATED   Final    Special Requests NONE   Final    Sputum evaluation     Final    Value: THIS SPECIMEN IS ACCEPTABLE. RESPIRATORY CULTURE REPORT TO FOLLOW.     Performed at [facility]   Report Status 03/01/2012 FINAL   Final   CULTURE, RESPIRATORY     Status: Normal   Collection Time   03/01/12  7:54 AM      Component Value Range Status Comment   Specimen Description SPUTUM EXPECTORATED   Final    Special Requests NONE   Final    Gram Stain     Final    Value: MODERATE WBC PRESENT,BOTH PMN AND MONONUCLEAR     RARE SQUAMOUS EPITHELIAL CELLS PRESENT     FEW GRAM POSITIVE COCCI     IN PAIRS FEW GRAM NEGATIVE RODS   Culture NORMAL OROPHARYNGEAL FLORA   Final    Report Status 03/03/2012 FINAL   Final   CLOSTRIDIUM DIFFICILE BY PCR     Status: Normal   Collection Time   03/01/12  1:07 PM      Component Value Range Status Comment   C difficile by pcr NEGATIVE  NEGATIVE Final   MRSA PCR SCREENING     Status: Normal   Collection Time   03/01/12  11:06 PM      Component Value Range Status Comment   MRSA by PCR NEGATIVE  NEGATIVE Final     Studies/Results: Dg Chest 2 View  03/05/2012  *RADIOLOGY REPORT*  Clinical Data: 66 year old female with edema, pneumonia.  CHEST - 2 VIEW  Comparison: 03/01/2012 and earlier.  Findings: Continued bilateral lower lobe airspace disease.  Small superimposed pleural effusions.  Mild increased right upper lobe perihilar opacity is new.  No pneumothorax or edema.  Cardiac size and mediastinal contours are within normal limits.  Visualized tracheal air column is within normal limits.  No acute osseous abnormality identified.  Surgical clips and suture material in the abdomen.  IMPRESSION:  Small bilateral pleural effusions and bilateral lower lobe airspace disease.  New subtle right perihilar airspace disease.  Findings suspicious for bilateral pneumonia with some progression.   Original Report Authenticated By: Randall An, M.D.     Medications:  Scheduled:    . aspirin  162 mg Oral Daily  . atorvastatin  20 mg Oral q1800  . ceFEPime (MAXIPIME) IV  1 g Intravenous Q12H  . cyanocobalamin  1,000 mcg Intramuscular Daily  . diltiazem  60 mg Oral TID  . feeding supplement  237 mL Oral TID BM  . heparin  5,000 Units Subcutaneous Q8H  . insulin aspart  0-20 Units Subcutaneous TID WC  . insulin aspart  0-5 Units Subcutaneous QHS  . insulin glargine  65 Units Subcutaneous QHS  . labetalol  400 mg Oral BID  . levalbuterol  0.63 mg Nebulization QID  . levothyroxine  137 mcg Oral QAC breakfast  . losartan  100 mg Oral Daily  . metFORMIN  500 mg Oral TID WC  . omega-3 acid ethyl esters  1 g Oral BID  . potassium chloride  20 mEq Oral BID  . sodium chloride      . vancomycin  1,250 mg Intravenous Q24H   Continuous:  [id number], guaiFENesin-dextromethorphan, levalbuterol, nitroGLYCERIN, ondansetron (ZOFRAN) IV, polyethylene glycol, sodium chloride, traZODone  Assessment: Principal Problem:   *Acute respiratory failure Active Problems:  HYPERLIPIDEMIA-MIXED  CAD, NATIVE VESSEL  OVERWEIGHT/OBESITY  HYPERTENSION, UNSPECIFIED  Atrial fib/flutter, transient  Stable angina  Hypothyroid  DM (diabetes mellitus), type 2, uncontrolled  Acute non-ST segment elevation myocardial infarction  Community acquired bacterial pneumonia  B12 deficiency anemia  Bronchospasm  Hypokalemia   1. Acute respiratory failure with hypoxia, secondary to pneumonia with concomitant acute bronchitis and possible fluid overload, however, no new pulmonary edema was seen on the followup chest x-ray. She was given IV Lasix x3 doses. She is symptomatically improved.  Pneumonia/acute bronchitis. Improving clinically, but has developed more of a cough. Her white blood cell count is up just a little today. Her followup chest x-ray on 03/05/12 was more or less stable with bilateral pneumonia. We'll continue vancomycin, cefepime, and Xopenex. Steroids are being avoided because of recent myocardial ischemia and volume overload.  Acute non-ST elevation myocardial infarction versus elevation of troponin I from demand ischemia/in the setting of known CAD. Cardiology's assessment and recommendations noted and appreciated. We'll continue cardiac medications. Aspirin restarted. Status post IV heparin.   Transient atrial fibrillation/atrial flutter. Resolved. Likely secondary to pulmonary process. Per cardiology, anticoagulation is not indicated in the setting of acute pulmonary process and normal LV function. She has been maintaining normal sinus rhythm.  Vitamin B12 deficiency anemia. Also concomitant iron deficiency anemia. Vitamin B12 injections have been started. We'll hold off on iron supplementation until discharge. Of note, she has a history of diverticulosis and colon polyps. No active GI bleeding during the hospitalization.  Type 2 diabetes mellitus. And delivery blood glucose has been reasonable here. Her hemoglobin  A1c is 8.5.  Hypokalemia. Likely secondary to Lasix. Supplemented and repleted.  Plan:  1. Add Tessalon Perles 3 times a day and each bedtime Tussionex. 2. Continue supportive treatment and antibiotics.   LOS: 8 days   Virginia Huber 03/06/2012, 9:43 AM

## 2012-03-06 NOTE — Progress Notes (Signed)
ANTIBIOTIC CONSULT NOTE   Pharmacy Consult for Vancomycin Indication: rule out pneumonia  Allergies  Allergen Reactions  . Penicillins Hives  . Percocet (Oxycodone-Acetaminophen) Nausea And Vomiting   Patient Measurements: Height: 5\' 3"  (160 cm) Weight: 152 lb 9.6 oz (69.219 kg) IBW/kg (Calculated) : 52.4   Vital Signs: Temp: 98.4 F (36.9 C) (09/15 0646) Temp src: Oral (09/15 0646) BP: 152/80 mmHg (09/15 0646) Pulse Rate: 68  (09/15 0646) Intake/Output from previous day: 09/14 0701 - 09/15 0700 In: 1620 [P.O.:1320; IV Piggyback:300] Out: -  Intake/Output from this shift:    Labs:  [facility] 03/06/12 0640 03/05/12 0502  WBC 10.8* --  HGB 10.3* --  PLT 327 --  LABCREA -- --  CREATININE 0.81 0.86   Estimated Creatinine Clearance: 63.7 ml/min (by C-G formula based on Cr of 0.81). No results found for this basename: VANCOTROUGH:2,VANCOPEAK:2,VANCORANDOM:2,GENTTROUGH:2,GENTPEAK:2,GENTRANDOM:2,TOBRATROUGH:2,TOBRAPEAK:2,TOBRARND:2,AMIKACINPEAK:2,AMIKACINTROU:2,AMIKACIN:2, in the last 72 hours   Microbiology: Recent Results (from the past 720 hour(s))  CULTURE, BLOOD (ROUTINE X 2)     Status: Normal   Collection Time   02/28/12 10:35 PM      Component Value Range Status Comment   Specimen Description BLOOD LEFT ANTECUBITAL   Final    Special Requests     Final    Value: BOTTLES DRAWN AEROBIC AND ANAEROBIC AEB=15CC ANA=10CC   Culture NO GROWTH 5 DAYS   Final    Report Status 03/04/2012 FINAL   Final   CULTURE, BLOOD (ROUTINE X 2)     Status: Normal   Collection Time   02/28/12 10:39 PM      Component Value Range Status Comment   Specimen Description BLOOD LEFT HAND   Final    Special Requests     Final    Value: BOTTLES DRAWN AEROBIC AND ANAEROBIC AEB=15CC ANA=12CC   Culture NO GROWTH 5 DAYS   Final    Report Status 03/04/2012 FINAL   Final   CULTURE, EXPECTORATED SPUTUM-ASSESSMENT     Status: Normal   Collection Time   03/01/12  7:54 AM      Component Value Range  Status Comment   Specimen Description SPUTUM EXPECTORATED   Final    Special Requests NONE   Final    Sputum evaluation     Final    Value: THIS SPECIMEN IS ACCEPTABLE. RESPIRATORY CULTURE REPORT TO FOLLOW.     Performed at [facility]   Report Status 03/01/2012 FINAL   Final   CULTURE, RESPIRATORY     Status: Normal   Collection Time   03/01/12  7:54 AM      Component Value Range Status Comment   Specimen Description SPUTUM EXPECTORATED   Final    Special Requests NONE   Final    Gram Stain     Final    Value: MODERATE WBC PRESENT,BOTH PMN AND MONONUCLEAR     RARE SQUAMOUS EPITHELIAL CELLS PRESENT     FEW GRAM POSITIVE COCCI     IN PAIRS FEW GRAM NEGATIVE RODS   Culture NORMAL OROPHARYNGEAL FLORA   Final    Report Status 03/03/2012 FINAL   Final   CLOSTRIDIUM DIFFICILE BY PCR     Status: Normal   Collection Time   03/01/12  1:07 PM      Component Value Range Status Comment   C difficile by pcr NEGATIVE  NEGATIVE Final   MRSA PCR SCREENING     Status: Normal   Collection Time   03/01/12 11:06 PM  Component Value Range Status Comment   MRSA by PCR NEGATIVE  NEGATIVE Final    Medical History: Past Medical History  Diagnosis Date  . HTN (hypertension)   . DM (diabetes mellitus)   . Heart block   . Hypothyroidism   . Ovarian tumor   . Lupus   . S/P colonoscopy 2005    Dr. Laural Golden: pancolonic divericula, polyp, path unknown currently  . Coronary artery disease    Medications:  Scheduled:     . aspirin  162 mg Oral Daily  . atorvastatin  20 mg Oral q1800  . ceFEPime (MAXIPIME) IV  1 g Intravenous Q12H  . cyanocobalamin  1,000 mcg Intramuscular Daily  . diltiazem  60 mg Oral TID  . feeding supplement  237 mL Oral TID BM  . heparin  5,000 Units Subcutaneous Q8H  . insulin aspart  0-20 Units Subcutaneous TID WC  . insulin aspart  0-5 Units Subcutaneous QHS  . insulin glargine  65 Units Subcutaneous QHS  . labetalol  400 mg Oral BID  . levalbuterol  0.63 mg  Nebulization QID  . levothyroxine  137 mcg Oral QAC breakfast  . losartan  100 mg Oral Daily  . metFORMIN  500 mg Oral TID WC  . omega-3 acid ethyl esters  1 g Oral BID  . potassium chloride  20 mEq Oral BID  . sodium chloride      . vancomycin  1,250 mg Intravenous Q24H   Assessment: 66yo female with SOB, rule out pna.  Estimated Creatinine Clearance: 63.7 ml/min (by C-G formula based on Cr of 0.81).  Goal of Therapy:  Vancomycin trough level 15-20 mcg/ml Eradicate infection.  Plan: Vancomycin 1250mg  iv q24hrs Check trough tomorrow am Monitor renal fxn and cultures.  Hart Robinsons A 03/06/2012,9:34 AM

## 2012-03-06 NOTE — Plan of Care (Signed)
Problem: Phase III Progression Outcomes Goal: Tolerating diet Outcome: Completed/Met Date Met:  03/06/12 Pt tolerating Carb Modified diet without difficulty.

## 2012-03-07 DIAGNOSIS — E876 Hypokalemia: Secondary | ICD-10-CM

## 2012-03-07 LAB — GLUCOSE, CAPILLARY

## 2012-03-07 LAB — VANCOMYCIN, TROUGH: Vancomycin Tr: 7.3 ug/mL — ABNORMAL LOW (ref 10.0–20.0)

## 2012-03-07 MED ORDER — SODIUM CHLORIDE 0.9 % IJ SOLN
INTRAMUSCULAR | Status: AC
  Administered 2012-03-07: 10 mL
  Filled 2012-03-07: qty 3

## 2012-03-07 MED ORDER — VANCOMYCIN HCL IN DEXTROSE 1-5 GM/200ML-% IV SOLN
1000.0000 mg | Freq: Two times a day (BID) | INTRAVENOUS | Status: DC
  Administered 2012-03-07: 1000 mg via INTRAVENOUS
  Filled 2012-03-07 (×4): qty 200

## 2012-03-07 NOTE — Progress Notes (Signed)
ANTIBIOTIC CONSULT NOTE   Pharmacy Consult for Vancomycin Indication: rule out pneumonia  Allergies  Allergen Reactions  . Penicillins Hives  . Percocet (Oxycodone-Acetaminophen) Nausea And Vomiting   Patient Measurements: Height: 5\' 3"  (160 cm) Weight: 152 lb 9.6 oz (69.219 kg) IBW/kg (Calculated) : 52.4   Vital Signs: Temp: 98.4 F (36.9 C) (09/16 0520) Temp src: Oral (09/16 0520) BP: 124/66 mmHg (09/16 0520) Pulse Rate: 68  (09/16 0520) Intake/Output from previous day: 09/15 0701 - 09/16 0700 In: 840 [P.O.:840] Out: -  Intake/Output from this shift:    Labs:  Basename 03/06/12 0640 03/05/12 0502  WBC 10.8* --  HGB 10.3* --  PLT 327 --  LABCREA -- --  CREATININE 0.81 0.86   Estimated Creatinine Clearance: 63.7 ml/min (by C-G formula based on Cr of 0.81).  Basename 03/07/12 0839  VANCOTROUGH 7.3*  VANCOPEAK --  Jake Michaelis --  GENTTROUGH --  GENTPEAK --  GENTRANDOM --  TOBRATROUGH --  TOBRAPEAK --  TOBRARND --  AMIKACINPEAK --  AMIKACINTROU --  AMIKACIN --    Microbiology: Recent Results (from the past 720 hour(s))  CULTURE, BLOOD (ROUTINE X 2)     Status: Normal   Collection Time   02/28/12 10:35 PM      Component Value Range Status Comment   Specimen Description BLOOD LEFT ANTECUBITAL   Final    Special Requests     Final    Value: BOTTLES DRAWN AEROBIC AND ANAEROBIC AEB=15CC ANA=10CC   Culture NO GROWTH 5 DAYS   Final    Report Status 03/04/2012 FINAL   Final   CULTURE, BLOOD (ROUTINE X 2)     Status: Normal   Collection Time   02/28/12 10:39 PM      Component Value Range Status Comment   Specimen Description BLOOD LEFT HAND   Final    Special Requests     Final    Value: BOTTLES DRAWN AEROBIC AND ANAEROBIC AEB=15CC ANA=12CC   Culture NO GROWTH 5 DAYS   Final    Report Status 03/04/2012 FINAL   Final   CULTURE, EXPECTORATED SPUTUM-ASSESSMENT     Status: Normal   Collection Time   03/01/12  7:54 AM      Component Value Range Status Comment   Specimen Description SPUTUM EXPECTORATED   Final    Special Requests NONE   Final    Sputum evaluation     Final    Value: THIS SPECIMEN IS ACCEPTABLE. RESPIRATORY CULTURE REPORT TO FOLLOW.     Performed at [facility]   Report Status 03/01/2012 FINAL   Final   CULTURE, RESPIRATORY     Status: Normal   Collection Time   03/01/12  7:54 AM      Component Value Range Status Comment   Specimen Description SPUTUM EXPECTORATED   Final    Special Requests NONE   Final    Gram Stain     Final    Value: MODERATE WBC PRESENT,BOTH PMN AND MONONUCLEAR     RARE SQUAMOUS EPITHELIAL CELLS PRESENT     FEW GRAM POSITIVE COCCI     IN PAIRS FEW GRAM NEGATIVE RODS   Culture NORMAL OROPHARYNGEAL FLORA   Final    Report Status 03/03/2012 FINAL   Final   CLOSTRIDIUM DIFFICILE BY PCR     Status: Normal   Collection Time   03/01/12  1:07 PM      Component Value Range Status Comment   C difficile by pcr NEGATIVE  NEGATIVE Final   MRSA PCR SCREENING     Status: Normal   Collection Time   03/01/12 11:06 PM      Component Value Range Status Comment   MRSA by PCR NEGATIVE  NEGATIVE Final    Medical History: Past Medical History  Diagnosis Date  . HTN (hypertension)   . DM (diabetes mellitus)   . Heart block   . Hypothyroidism   . Ovarian tumor   . Lupus   . S/P colonoscopy 2005    Dr. Laural Golden: pancolonic divericula, polyp, path unknown currently  . Coronary artery disease    Medications:  Scheduled:     . aspirin  162 mg Oral Daily  . atorvastatin  20 mg Oral q1800  . benzonatate  100 mg Oral TID  . ceFEPime (MAXIPIME) IV  1 g Intravenous Q12H  . chlorpheniramine-HYDROcodone  5 mL Oral QHS  . cyanocobalamin  1,000 mcg Intramuscular Daily  . diltiazem  60 mg Oral TID  . feeding supplement  237 mL Oral TID BM  . heparin  5,000 Units Subcutaneous Q8H  . insulin aspart  0-20 Units Subcutaneous TID WC  . insulin aspart  0-5 Units Subcutaneous QHS  . insulin glargine  65 Units Subcutaneous  QHS  . labetalol  400 mg Oral BID  . levalbuterol  0.63 mg Nebulization Q4H  . levothyroxine  137 mcg Oral QAC breakfast  . losartan  100 mg Oral Daily  . metFORMIN  500 mg Oral TID WC  . omega-3 acid ethyl esters  1 g Oral BID  . vancomycin  1,250 mg Intravenous Q24H  . vancomycin  1,000 mg Intravenous Q12H   Assessment: 66yo female with SOB, rule out pna.  Estimated Creatinine Clearance: 63.7 ml/min (by C-G formula based on Cr of 0.81).  Renal fxn stable.  Trough below goal.  Goal of Therapy:  Vancomycin trough level 15-20 mcg/ml Eradicate infection.  Plan: Change Vancomycin to 1gm iv q12hrs Check trough at steady state Monitor renal fxn and cultures. Length of therapy per MD  Hart Robinsons A 03/07/2012,10:15 AM

## 2012-03-07 NOTE — Progress Notes (Signed)
Subjective: The patient feels better today. The cough medicine has helped her cough. Overall, she slept better. She still feels a little weak.  Objective: Vital signs in last 24 hours: Filed Vitals:   03/07/12 0025 03/07/12 0520 03/07/12 0741 03/07/12 1129  BP:  124/66    Pulse:  68    Temp:  98.4 F (36.9 C)    TempSrc:  Oral    Resp:  20    Height:      Weight:      SpO2: 93% 92% 92% 93%    Intake/Output Summary (Last 24 hours) at 03/07/12 1354 Last data filed at 03/06/12 1700  Gross per 24 hour  Intake    240 ml  Output      0 ml  Net    240 ml    Weight change:   Physical exam: General: Pleasant 66 year old Caucasian woman sitting up in chair, in no acute distress. Lungs: Decreased wheezing and crackles. Breathing is nonlabored. Heart: S1, S2, no murmurs rubs or gallops. Abdomen: Positive bowel sounds, soft, nontender, nondistended. Extremities: No pedal edema. Neurologic: She is alert and oriented x3. Cranial nerves II through XII are intact.  Lab Results: Basic Metabolic Panel:  Basename 03/06/12 0640 03/05/12 0502  NA 137 137  K 3.8 3.3*  CL 103 102  CO2 26 25  GLUCOSE 112* 154*  BUN 10 14  CREATININE 0.81 0.86  CALCIUM 9.3 8.6  MG -- --  PHOS -- --   Liver Function Tests: No results found for this basename: AST:2,ALT:2,ALKPHOS:2,BILITOT:2,PROT:2,ALBUMIN:2 in the last 72 hours No results found for this basename: LIPASE:2,AMYLASE:2 in the last 72 hours No results found for this basename: AMMONIA:2 in the last 72 hours CBC:  Basename 03/06/12 0640  WBC 10.8*  NEUTROABS --  HGB 10.3*  HCT 30.5*  MCV 90.8  PLT 327   Cardiac Enzymes: No results found for this basename: CKTOTAL:3,CKMB:3,CKMBINDEX:3,TROPONINI:3 in the last 72 hours BNP:  [facility] 03/05/12 0502  PROBNP 1047.0*   D-Dimer: No results found for this basename: DDIMER:2 in the last 72 hours CBG:  Basename 03/07/12 1151 03/07/12 0735 03/06/12 2119 03/06/12 1643 03/06/12 1112  03/06/12 0722  GLUCAP 103* 90 136* 114* 183* 108*   Hemoglobin A1C: No results found for this basename: HGBA1C in the last 72 hours Fasting Lipid Panel: No results found for this basename: CHOL,HDL,LDLCALC,TRIG,CHOLHDL,LDLDIRECT in the last 72 hours Thyroid Function Tests: No results found for this basename: TSH,T4TOTAL,FREET4,T3FREE,THYROIDAB in the last 72 hours Anemia Panel: No results found for this basename: VITAMINB12,FOLATE,FERRITIN,TIBC,IRON,RETICCTPCT in the last 72 hours Coagulation: No results found for this basename: LABPROT:2,INR:2 in the last 72 hours Urine Drug Screen: Drugs of Abuse  No results found for this basename: labopia,  cocainscrnur,  labbenz,  amphetmu,  thcu,  labbarb    Alcohol Level: No results found for this basename: ETH:2 in the last 72 hours Urinalysis: No results found for this basename: COLORURINE:2,APPERANCEUR:2,LABSPEC:2,PHURINE:2,GLUCOSEU:2,HGBUR:2,BILIRUBINUR:2,KETONESUR:2,PROTEINUR:2,UROBILINOGEN:2,NITRITE:2,LEUKOCYTESUR:2 in the last 72 hours Misc. Labs:  2-D echocardiogram:Study Conclusions:   - Left ventricle: The cavity size was normal. There was moderate concentric hypertrophy. Systolic function was normal. The estimated ejection fraction was in the range of 55% to 60%. Wall motion was normal; there were no regional wall motion abnormalities. - Left atrium: The atrium was mildly dilated. - Atrial septum: No defect or patent foramen ovale was identified. Impressions:  - Compared to the prior study 05/23/08, there has been no significant interval change. Transthoracic echocardiography. M-mode, complete 2D, spectral Doppler, and color  Doppler. Height: Height: 160cm. Height: 63in. Weight: Weight: 69.9kg. Weight: 153.7lb. Body mass index: BMI: 27.3kg/m^2. Body surface area: BSA: 1.21m^2. Patient status: Inpatient. Location: Bedside.      Micro: Recent Results (from the past 240 hour(s))  CULTURE, BLOOD (ROUTINE X 2)     Status:  Normal   Collection Time   02/28/12 10:35 PM      Component Value Range Status Comment   Specimen Description BLOOD LEFT ANTECUBITAL   Final    Special Requests     Final    Value: BOTTLES DRAWN AEROBIC AND ANAEROBIC AEB=15CC ANA=10CC   Culture NO GROWTH 5 DAYS   Final    Report Status 03/04/2012 FINAL   Final   CULTURE, BLOOD (ROUTINE X 2)     Status: Normal   Collection Time   02/28/12 10:39 PM      Component Value Range Status Comment   Specimen Description BLOOD LEFT HAND   Final    Special Requests     Final    Value: BOTTLES DRAWN AEROBIC AND ANAEROBIC AEB=15CC ANA=12CC   Culture NO GROWTH 5 DAYS   Final    Report Status 03/04/2012 FINAL   Final   CULTURE, EXPECTORATED SPUTUM-ASSESSMENT     Status: Normal   Collection Time   03/01/12  7:54 AM      Component Value Range Status Comment   Specimen Description SPUTUM EXPECTORATED   Final    Special Requests NONE   Final    Sputum evaluation     Final    Value: THIS SPECIMEN IS ACCEPTABLE. RESPIRATORY CULTURE REPORT TO FOLLOW.     Performed at [facility]   Report Status 03/01/2012 FINAL   Final   CULTURE, RESPIRATORY     Status: Normal   Collection Time   03/01/12  7:54 AM      Component Value Range Status Comment   Specimen Description SPUTUM EXPECTORATED   Final    Special Requests NONE   Final    Gram Stain     Final    Value: MODERATE WBC PRESENT,BOTH PMN AND MONONUCLEAR     RARE SQUAMOUS EPITHELIAL CELLS PRESENT     FEW GRAM POSITIVE COCCI     IN PAIRS FEW GRAM NEGATIVE RODS   Culture NORMAL OROPHARYNGEAL FLORA   Final    Report Status 03/03/2012 FINAL   Final   CLOSTRIDIUM DIFFICILE BY PCR     Status: Normal   Collection Time   03/01/12  1:07 PM      Component Value Range Status Comment   C difficile by pcr NEGATIVE  NEGATIVE Final   MRSA PCR SCREENING     Status: Normal   Collection Time   03/01/12 11:06 PM      Component Value Range Status Comment   MRSA by PCR NEGATIVE  NEGATIVE Final      Studies/Results: No results found.  Medications:  Scheduled:    . aspirin  162 mg Oral Daily  . atorvastatin  20 mg Oral q1800  . benzonatate  100 mg Oral TID  . ceFEPime (MAXIPIME) IV  1 g Intravenous Q12H  . chlorpheniramine-HYDROcodone  5 mL Oral QHS  . cyanocobalamin  1,000 mcg Intramuscular Daily  . diltiazem  60 mg Oral TID  . feeding supplement  237 mL Oral TID BM  . heparin  5,000 Units Subcutaneous Q8H  . insulin aspart  0-20 Units Subcutaneous TID WC  . insulin aspart  0-5 Units Subcutaneous QHS  .  insulin glargine  65 Units Subcutaneous QHS  . labetalol  400 mg Oral BID  . levalbuterol  0.63 mg Nebulization Q4H  . levothyroxine  137 mcg Oral QAC breakfast  . losartan  100 mg Oral Daily  . metFORMIN  500 mg Oral TID WC  . omega-3 acid ethyl esters  1 g Oral BID  . vancomycin  1,250 mg Intravenous Q24H  . vancomycin  1,000 mg Intravenous Q12H   Continuous:  [id number], guaiFENesin-dextromethorphan, levalbuterol, nitroGLYCERIN, ondansetron (ZOFRAN) IV, polyethylene glycol, sodium chloride, traZODone  Assessment: Principal Problem:  *Acute respiratory failure Active Problems:  HYPERLIPIDEMIA-MIXED  CAD, NATIVE VESSEL  OVERWEIGHT/OBESITY  HYPERTENSION, UNSPECIFIED  Atrial fib/flutter, transient  Stable angina  Hypothyroid  DM (diabetes mellitus), type 2, uncontrolled  Acute non-ST segment elevation myocardial infarction  Community acquired bacterial pneumonia  B12 deficiency anemia  Bronchospasm  Hypokalemia   1. Acute respiratory failure with hypoxia, secondary to pneumonia with concomitant acute bronchitis and possible fluid overload, however, no new pulmonary edema was seen on the followup chest x-ray. She was given IV Lasix x3 doses. She is symptomatically improved.  Pneumonia/acute bronchitis. Improving clinically, but has developed more of a cough. Her white blood cell count is up just a little today. Her followup chest x-ray on 03/05/12 was  more or less stable with bilateral pneumonia. We'll continue vancomycin, cefepime, and Xopenex. Steroids are being avoided because of recent myocardial ischemia and volume overload. Her cough is less on Tussionex and Tessalon Perles.  Acute non-ST elevation myocardial infarction versus elevation of troponin I from demand ischemia/in the setting of known CAD. Cardiology's assessment and recommendations noted and appreciated. We'll continue cardiac medications. Aspirin restarted. Status post IV heparin.   Transient atrial fibrillation/atrial flutter. Resolved. Likely secondary to pulmonary process. Per cardiology, anticoagulation is not indicated in the setting of acute pulmonary process and normal LV function. She has been maintaining normal sinus rhythm.  Vitamin B12 deficiency anemia. Also concomitant iron deficiency anemia. Vitamin B12 injections have been started. We'll hold off on iron supplementation until discharge. Of note, she has a history of diverticulosis and colon polyps. No active GI bleeding during the hospitalization.  Type 2 diabetes mellitus. And delivery blood glucose has been reasonable here. Her hemoglobin A1c is 8.5.  Hypokalemia. Likely secondary to Lasix. Supplemented and repleted.  Plan:  1. Increase activity as tolerated. 2. Anticipate discharge tomorrow.   LOS: 9 days   Flynn Gwyn 03/07/2012, 1:54 PM

## 2012-03-07 NOTE — Care Management Note (Signed)
    Page 1 of 1   03/08/2012     1:13:18 PM   CARE MANAGEMENT NOTE 03/08/2012  Patient:  Virginia Huber, Virginia Huber   Account Number:  [id number]  Date Initiated:  03/02/2012  Documentation initiated by:  Claretha Cooper  Subjective/Objective Assessment:   Pt admitted with AF RVR and PNA. PTA lived with spouse in their home. Was on Med Surg floor and became increasing hypoxic requiring BIPAP and transfered to the unit overnight     Action/Plan:   CM to follow pt's progress to identify disposition and DC needs.   Anticipated DC Date:  03/08/2012   Anticipated DC Plan:  [place]  CM consult      Choice offered to / List presented to:             Status of service:  Completed, signed off Medicare Important Message given?  YES (If response is "NO", the following Medicare IM given date fields will be blank) Date Medicare IM given:  03/08/2012 Date Additional Medicare IM given:    Discharge Disposition:  HOME/SELF CARE  Per UR Regulation:    If discussed at Long Length of Stay Meetings, dates discussed:   03/08/2012    Comments:  03/07/12 [place] BSN CM Pt now out of ICU. Spoke to pt at bedside. States she plans to go home with her spouse and he will take gpod care of her. No HH/DME needs identified. Anticipate DC tomorrow 03/02/12 Claretha Cooper RN BSN CM

## 2012-03-08 LAB — BASIC METABOLIC PANEL
CO2: 27 mEq/L (ref 19–32)
Calcium: 9.4 mg/dL (ref 8.4–10.5)
Glucose, Bld: 97 mg/dL (ref 70–99)
Sodium: 139 mEq/L (ref 135–145)

## 2012-03-08 LAB — CBC
MCH: 30.4 pg (ref 26.0–34.0)
MCV: 91.8 fL (ref 78.0–100.0)
Platelets: 356 10*3/uL (ref 150–400)
RBC: 3.29 MIL/uL — ABNORMAL LOW (ref 3.87–5.11)

## 2012-03-08 LAB — GLUCOSE, CAPILLARY: Glucose-Capillary: 83 mg/dL (ref 70–99)

## 2012-03-08 MED ORDER — BENZONATATE 100 MG PO CAPS: 100.0000 mg | ORAL_CAPSULE | Freq: Three times a day (TID) | ORAL | Status: DC

## 2012-03-08 MED ORDER — OMEGA-3-ACID ETHYL ESTERS 1 G PO CAPS: 1.0000 g | ORAL_CAPSULE | Freq: Two times a day (BID) | ORAL | Status: DC

## 2012-03-08 MED ORDER — VITAMIN B-12 1000 MCG PO TABS: 1000.0000 ug | ORAL_TABLET | Freq: Every day | ORAL | Status: DC

## 2012-03-08 MED ORDER — CEFUROXIME AXETIL 500 MG PO TABS: 500.0000 mg | ORAL_TABLET | Freq: Two times a day (BID) | ORAL | Status: DC

## 2012-03-08 MED ORDER — LABETALOL HCL 100 MG PO TABS: 100.0000 mg | ORAL_TABLET | Freq: Two times a day (BID) | ORAL | Status: DC

## 2012-03-08 MED ORDER — HYDROCOD POLST-CHLORPHEN POLST 10-8 MG/5ML PO LQCR: 5.0000 mL | Freq: Every evening | ORAL | Status: DC | PRN

## 2012-03-08 MED ORDER — LEVALBUTEROL TARTRATE 45 MCG/ACT IN AERO: 1.0000 | INHALATION_SPRAY | RESPIRATORY_TRACT | Status: DC | PRN

## 2012-03-08 NOTE — Plan of Care (Signed)
Problem: Discharge Progression Outcomes Goal: Activity appropriate for discharge plan Outcome: Completed/Met Date Met:  03/08/12 ambulatory Goal: Other Discharge Outcomes/Goals Outcome: Completed/Met Date Met:  03/08/12 Discharge instructions read to pt and her spouse Both verbalized understanding of all instructions

## 2012-03-08 NOTE — Discharge Summary (Addendum)
Physician Discharge Summary  Virginia Huber [id number] DOB: 08-Dec-1945 DOA: 02/27/2012  PCP: Purvis Kilts, MD  Admit date: 02/27/2012 Discharge date: 03/08/2012  Recommendations for Outpatient Follow-up:  1. The patient was discharged home in improved condition. She will followup with her primary care physician Dr. [name] in her primary cardiologist Dr. wall as scheduled.  Discharge Diagnoses:   1. Acute respiratory failure secondary to treat your acquired pneumonia, bronchospasms with acute bronchitis, and subsequent flash pulmonary edema. 2. Elevated troponin I. secondary to demand ischemia versus non-ST elevation myocardial infarction. 3. Moderate LVH and ejection fraction of 60-65% per 2-D echocardiogram on 02/29/2012. 4. Transient atrial fib/atrial flutter. 5. History of coronary artery disease and stable angina. 6. Anemia, secondary to vitamin B12 deficiency. The patient's anemia panel revealed a total iron of 16, TIBC of 211, ferritin of 433, folate of greater than 20, and vitamin B12 of 194. 7. Hypothyroidism. 8. Hypokalemia. Repleted. 9. Hypertension. 10. Mixed hyperlipidemia. The patient's total cholesterol was 127, triglycerides 392, HDL cholesterol 25, and LDL cholesterol 24. 11. Type 2 diabetes mellitus.  Discharge Condition: Improved.  Diet recommendation: Heart healthy and carbohydrate modified.  Filed Weights   03/04/12 0458 03/05/12 0500 03/06/12 0646  Weight: 68.4 kg (150 lb 12.7 oz) 69.4 kg (153 lb) 69.219 kg (152 lb 9.6 oz)    History of present illness:  The patient is a 66 year old woman with a history significant for diabetes mellitus, nonobstructive coronary artery disease, and hyperlipidemia, who presented to the emergency department on 02/28/2012 with a chief complaint of chest pain and shortness of breath. In the emergency department, she was afebrile and tachycardic. She was oxygenating 98% on nasal cannula oxygen. Her lab data were significant for a  serum sodium 128, glucose of 318, WBC of 12.8, and normal troponin I. Her chest x-ray revealed no acute cardiopulmonary abnormalities. Her EKG revealed atrial fibrillation. She was admitted for further evaluation and management.  Hospital Course:  The patient was started on a Cardizem drip in the emergency department. Because of hyponatremia and relative volume depletion, IV fluids were started. When her heart rate improved, and subsequently converted to normal sinus rhythm, the Cardizem drip was discontinued. The dose of her labetalol was increased to 400 mg twice a day. Amlodipine was discontinued in favor of small dosing of oral diltiazem. The following day, her ABG on 5 L of oxygen revealed a pH of 7.4, PCO2 of 32, and PO2 of 60. Oxygen was started and titrated to keep her oxygen saturations greater than 90%. Her followup chest x-ray revealed bilateral lower lobe airspace opacities and small bilateral pleural effusions. Given her symptomatology, it was believed that pneumonia fluffed out on the chest x-ray after she was hydrated. Therefore, she was started on cefepime and vancomycin. Later, Avelox was added to cover atypical bacteria. Due to to her bronchospasms, she was started on bronchodilator therapy with Xopenex. Followup laboratory studies revealed an elevated troponin I of 0.55. The on call cardiologist at [facility] was notified. Following our conversation, the patient was started on intravenous heparin. A 2-D echocardiogram was ordered. It revealed LVH but per preserved LV function. She remained on heparin for at least 24-48 hours. The followup cardiology assessment was provided by Dr. Lattie Haw and colleagues. Because she had preserved LV function and had converted to normal sinus rhythm, long-term anticoagulation was not recommended. The patient's pleural effusions were likely a consequence of pneumonia or flash pulmonary edema from the rapid atrial fibrillation. However, she did not appear  to have decompensated congestive heart failure. Several doses of IV Lasix was given because of general volume overload from IV fluid hydration and flash pulmonary edema. Following the Lasix, she diuresed well.  Her potassium was supplemented following Lasix. IV fluids were tapered off after her serum sodium normalized. Her followup troponin I  completely normalized. Her TSH was within normal limits. Her Legionella antigen and strep pneumo antigen were both negative. Her hemoglobin ranged from 9.3-10.3. She has a history of diverticulosis and small internal hemorrhoids per colonoscopy in 2012. She was started on Pepcid prophylactically during the hospitalization. There was no gross GI or GU bleeding. Her lipid panel revealed hypertriglyceridemia. She was maintained on statin therapy, but fish oil treatment was started.  The patient's symptoms waxed and waned. She was briefly placed on BiPAP when she became acutely short of breath. This was short lived.  The patient's diabetes remained more or less stable. She remained in normal sinus rhythm after the initial conversion. She remained hemodynamically stable. Her oxygen saturation improved progressively. At the time of hospital discharge, she was oxygenating in the mid 90s on room air. She became less bronchospasm and less dyspneic. Once [employer] and Tussionex were added to her medication regimen, her cough subsided.   The patient improved clinically and symptomatically. At the time of discharge, she had no wheezes or crackles on exam. Diltiazem was eventually discontinued and Norvasc was restarted. The dose of labetalol remained elevated at 400 mg twice a day rather than 300 mg twice a day. She was discharged on several more days of antibiotic therapy.   Procedures: 2-D echocardiogram:Study Conclusions  - Left ventricle: The cavity size was normal. There was moderate concentric hypertrophy. Systolic function was normal. The estimated ejection  fraction was in the range of 55% to 60%. Wall motion was normal; there were no regional wall motion abnormalities. - Left atrium: The atrium was mildly dilated. - Atrial septum: No defect or patent foramen ovale was identified. Impressions:  - Compared to the prior study 05/23/08, there has been no significant interval change. Transthoracic echocardiography. M-mode, complete 2D, spectral Doppler, and color Doppler. Height: Height: 160cm. Height: 63in. Weight: Weight: 69.9kg. Weight: 153.7lb. Body mass index: BMI: 27.3kg/m^2. Body surface area: BSA: 1.78m^2. Patient status: Inpatient. Location: Bedside.      Consultations:  Jacqulyn Ducking, M.D.  Discharge Exam: Filed Vitals:   03/08/12 0500 03/08/12 0726 03/08/12 0900 03/08/12 1055  BP: 148/71  148/69   Pulse: 65  67   Temp: 98.1 F (36.7 C)     TempSrc: Oral     Resp: 18  18   Height:      Weight:      SpO2: 92% 95%  96%    General: No acute distress Cardiovascular: S1, S2, with a soft systolic murmur Respiratory: Clear to auscultation bilaterally  Discharge Instructions  Discharge Orders    Future Appointments: Provider: Department: Dept Phone: Center:   04/07/2012 11:30 AM Renella Cunas, MD [facility] [phone] [id number]     Future Orders Please Complete By Expires   Ambulatory referral to Nutrition and Diabetic Education      Comments:   Elevated HgbA1C: 8.4%   Diet - low sodium heart healthy      Diet Carb Modified      Increase activity slowly          Medication List     As of 03/08/2012  6:02 PM    STOP taking these medications  fexofenadine 180 MG tablet   Commonly known as: ALLEGRA      TAKE these medications         amLODipine 10 MG tablet   Commonly known as: NORVASC   Take 1 tablet (10 mg total) by mouth daily.      aspirin 325 MG EC tablet   Take 325 mg by mouth daily.      atorvastatin 40 MG tablet   Commonly known as: LIPITOR   Take 20 mg by mouth  daily.      benzonatate 100 MG capsule   Commonly known as: TESSALON   Take 1 capsule (100 mg total) by mouth 3 (three) times daily.      cefUROXime 500 MG tablet   Commonly known as: CEFTIN   Take 1 tablet (500 mg total) by mouth 2 (two) times daily. ANTIBIOTIC.      chlorpheniramine-HYDROcodone 10-8 MG/5ML Lqcr   Commonly known as: TUSSIONEX   Take 5 mLs by mouth at bedtime as needed (For cough).      insulin glargine 100 UNIT/ML injection   Commonly known as: LANTUS   Inject 60 Units into the skin at bedtime.      insulin lispro 100 UNIT/ML injection   Commonly known as: HUMALOG   Inject 12-14 Units into the skin 3 (three) times daily before meals. Uses according to sliding scale at home.      labetalol 300 MG tablet   Commonly known as: NORMODYNE   Take 1 tablet (300 mg total) by mouth 2 (two) times daily. Dose increase      labetalol 100 MG tablet   Commonly known as: NORMODYNE   Take 1 tablet (100 mg total) by mouth 2 (two) times daily. Take with your 300 mg tablet.      levalbuterol 45 MCG/ACT inhaler   Commonly known as: XOPENEX HFA   Inhale 1-2 puffs into the lungs every 4 (four) hours as needed for wheezing.      levothyroxine 137 MCG tablet   Commonly known as: SYNTHROID, LEVOTHROID   Take 137 mcg by mouth daily.      losartan 100 MG tablet   Commonly known as: COZAAR   Take 100 mg by mouth daily.      metFORMIN 500 MG tablet   Commonly known as: GLUCOPHAGE   Take 500-1,000 mg by mouth 2 (two) times daily with a meal. Takes 2 tablets in the morning and 1 at night.      nitroGLYCERIN 0.4 MG SL tablet   Commonly known as: NITROSTAT   Place 1 tablet (0.4 mg total) under the tongue every 5 (five) minutes as needed for chest pain (1 tablet every 5 minutes No more than 3 total per episode).      omega-3 acid ethyl esters 1 G capsule   Commonly known as: LOVAZA   Take 1 capsule (1 g total) by mouth 2 (two) times daily. For treatment of your elevated  triglyceride level.      vitamin B-12 1000 MCG tablet   Commonly known as: CYANOCOBALAMIN   Take 1 tablet (1,000 mcg total) by mouth daily.      Vitamin D 2000 UNITS tablet   Take 2,000 Units by mouth daily.           Follow-up Information    Follow up with Jenell Milliner, MD. On 04/07/2012. (At 11:30 am in the [place].)    Contact information:   [id number]  [place] [address] [phone]           The results of significant diagnostics from this hospitalization (including imaging, microbiology, ancillary and laboratory) are listed below for reference.    Significant Diagnostic Studies: Dg Chest 2 View  03/05/2012  *RADIOLOGY REPORT*  Clinical Data: 66 year old female with edema, pneumonia.  CHEST - 2 VIEW  Comparison: 03/01/2012 and earlier.  Findings: Continued bilateral lower lobe airspace disease.  Small superimposed pleural effusions.  Mild increased right upper lobe perihilar opacity is new.  No pneumothorax or edema.  Cardiac size and mediastinal contours are within normal limits.  Visualized tracheal air column is within normal limits.  No acute osseous abnormality identified.  Surgical clips and suture material in the abdomen.  IMPRESSION: Small bilateral pleural effusions and bilateral lower lobe airspace disease.  New subtle right perihilar airspace disease.  Findings suspicious for bilateral pneumonia with some progression.   Original Report Authenticated By: Randall An, M.D.    Dg Chest Port 1 View  03/01/2012  *RADIOLOGY REPORT*  Clinical Data: Fever and shortness of breath.  Follow-up pneumonia.  PORTABLE CHEST - 1 VIEW  Comparison: Earlier today.  Findings: The cardiac silhouette remains borderline enlarged.  No significant change in previously demonstrated bilateral airspace opacity with no visible pleural fluid other than a tiny amount of fluid in the minor fissure on the right.  There could be some fluid at the left  lung base, obscured by the airspace opacity.  Thoracic spine degenerative changes.  IMPRESSION: Stable bilateral pneumonia.   Original Report Authenticated By: Gerald Stabs, M.D.    Dg Chest Port 1 View  02/29/2012  *RADIOLOGY REPORT*  Clinical Data: Shortness of breath.  PORTABLE CHEST - 1 VIEW 02/29/2012 2011 hours:  Comparison: Portable chest x-ray 02/27/2012.  Findings: Interval development of airspace consolidation in both lung bases, left greater than right.  Cardiac silhouette upper normal in size to slightly enlarged but stable.  Pulmonary vascularity normal without evidence of pulmonary edema.  IMPRESSION: Acute pneumonia involving the lung bases, left greater than right.   Original Report Authenticated By: Deniece Portela, M.D.    Dg Chest Portable 1 View  02/27/2012  *RADIOLOGY REPORT*  Clinical Data: Chest pain  PORTABLE CHEST - 1 VIEW  Comparison: 11/28/2004  Findings: The heart and pulmonary vascularity are within normal limits.  The lungs are free of acute infiltrate or sizable effusion.  No acute bony abnormality is seen.  IMPRESSION: No acute abnormality.   Original Report Authenticated By: Everlene Farrier, M.D.     Microbiology: Recent Results (from the past 240 hour(s))  CULTURE, BLOOD (ROUTINE X 2)     Status: Normal   Collection Time   02/28/12 10:35 PM      Component Value Range Status Comment   Specimen Description BLOOD LEFT ANTECUBITAL   Final    Special Requests     Final    Value: BOTTLES DRAWN AEROBIC AND ANAEROBIC AEB=15CC ANA=10CC   Culture NO GROWTH 5 DAYS   Final    Report Status 03/04/2012 FINAL   Final   CULTURE, BLOOD (ROUTINE X 2)     Status: Normal   Collection Time   02/28/12 10:39 PM      Component Value Range Status Comment   Specimen Description BLOOD LEFT HAND   Final    Special Requests     Final    Value: BOTTLES DRAWN AEROBIC AND ANAEROBIC AEB=15CC ANA=12CC   Culture  NO GROWTH 5 DAYS   Final    Report Status 03/04/2012 FINAL   Final   CULTURE,  EXPECTORATED SPUTUM-ASSESSMENT     Status: Normal   Collection Time   03/01/12  7:54 AM      Component Value Range Status Comment   Specimen Description SPUTUM EXPECTORATED   Final    Special Requests NONE   Final    Sputum evaluation     Final    Value: THIS SPECIMEN IS ACCEPTABLE. RESPIRATORY CULTURE REPORT TO FOLLOW.     Performed at [facility]   Report Status 03/01/2012 FINAL   Final   CULTURE, RESPIRATORY     Status: Normal   Collection Time   03/01/12  7:54 AM      Component Value Range Status Comment   Specimen Description SPUTUM EXPECTORATED   Final    Special Requests NONE   Final    Gram Stain     Final    Value: MODERATE WBC PRESENT,BOTH PMN AND MONONUCLEAR     RARE SQUAMOUS EPITHELIAL CELLS PRESENT     FEW GRAM POSITIVE COCCI     IN PAIRS FEW GRAM NEGATIVE RODS   Culture NORMAL OROPHARYNGEAL FLORA   Final    Report Status 03/03/2012 FINAL   Final   CLOSTRIDIUM DIFFICILE BY PCR     Status: Normal   Collection Time   03/01/12  1:07 PM      Component Value Range Status Comment   C difficile by pcr NEGATIVE  NEGATIVE Final   MRSA PCR SCREENING     Status: Normal   Collection Time   03/01/12 11:06 PM      Component Value Range Status Comment   MRSA by PCR NEGATIVE  NEGATIVE Final      Labs: Basic Metabolic Panel:  Lab [id number] 03/06/12 0640 03/05/12 0502 03/03/12 0436 03/01/12 2126  NA 139 137 137 136 131*  K 4.1 3.8 3.3* 3.8 3.8  CL 103 103 102 105 100  CO2 27 26 25 21 20   GLUCOSE 97 112* 154* 113* 151*  BUN 13 10 14 20 19   CREATININE 0.82 0.81 0.86 1.09 1.11*  CALCIUM 9.4 9.3 8.6 9.0 8.6  MG -- -- -- -- --  PHOS -- -- -- -- --   Liver Function Tests: No results found for this basename: AST:5,ALT:5,ALKPHOS:5,BILITOT:5,PROT:5,ALBUMIN:5 in the last 168 hours No results found for this basename: LIPASE:5,AMYLASE:5 in the last 168 hours No results found for this basename: AMMONIA:5 in the last 168 hours CBC:  Lab 03/08/12 0512 03/06/12 0640  03/03/12 0436 03/02/12 0422 03/01/12 2126  WBC 10.6* 10.8* 9.3 9.3 10.8*  NEUTROABS -- -- -- -- 8.6*  HGB 10.0* 10.3* 9.3* 9.2* 9.8*  HCT 30.2* 30.5* 27.9* 27.6* 29.0*  MCV 91.8 90.8 91.2 91.1 91.2  PLT 356 327 253 214 207   Cardiac Enzymes: No results found for this basename: CKTOTAL:5,CKMB:5,CKMBINDEX:5,TROPONINI:5 in the last 168 hours BNP: BNP (last 3 results)  Basename 03/05/12 0502 03/02/12 0854 02/29/12 1945  PROBNP 1047.0* 1823.0* 1447.0*   CBG:  Lab 03/08/12 1125 03/08/12 0735 03/07/12 2107 03/07/12 1648 03/07/12 1151  GLUCAP 176* 83 137* 126* 103*    Time coordinating discharge: Greater than 30 minutes  Signed:  Florencio Hollibaugh  [facility] 03/08/2012, 6:02 PM

## 2012-03-08 NOTE — Telephone Encounter (Signed)
Pt discharged from [facility] today.

## 2012-03-08 NOTE — Telephone Encounter (Signed)
Sent letter to pt home via [place] Mail in attempt to contact pt to schedule appointment.  

## 2012-03-14 NOTE — Telephone Encounter (Signed)
Sent letter to pt home via [place] Mail in attempt to contact pt to schedule appointment.  

## 2012-03-16 ENCOUNTER — Ambulatory Visit (HOSPITAL_COMMUNITY)
Admission: RE | Admit: 2012-03-16 | Discharge: 2012-03-16 | Disposition: A | Discharge status: Home / Self Care | Payer: Medicare Other | Source: Ambulatory Visit | Attending: Physician Assistant | Admitting: Physician Assistant

## 2012-03-16 ENCOUNTER — Other Ambulatory Visit (HOSPITAL_COMMUNITY): Payer: Self-pay | Admitting: Physician Assistant

## 2012-03-16 DIAGNOSIS — J159 Unspecified bacterial pneumonia: Secondary | ICD-10-CM

## 2012-03-16 DIAGNOSIS — J189 Pneumonia, unspecified organism: Secondary | ICD-10-CM | POA: Insufficient documentation

## 2012-03-16 DIAGNOSIS — I4891 Unspecified atrial fibrillation: Secondary | ICD-10-CM

## 2012-03-16 DIAGNOSIS — Z09 Encounter for follow-up examination after completed treatment for conditions other than malignant neoplasm: Secondary | ICD-10-CM | POA: Insufficient documentation

## 2012-03-21 NOTE — Telephone Encounter (Signed)
Sent letter to pt home via [place] Mail in attempt to contact pt to schedule appointment.  

## 2012-03-23 NOTE — Telephone Encounter (Signed)
Appointment scheduled for 04/04/12 at 10:00 AM.

## 2012-03-31 ENCOUNTER — Telehealth (HOSPITAL_COMMUNITY): Payer: Self-pay | Admitting: Dietician

## 2012-03-31 NOTE — Telephone Encounter (Signed)
Mailed appointment confirmation letter and instructions for appointment scheduled 04/04/12 at 10:00 AM via [place] Mail.

## 2012-04-01 ENCOUNTER — Other Ambulatory Visit: Payer: Self-pay | Admitting: Obstetrics and Gynecology

## 2012-04-01 DIAGNOSIS — Z139 Encounter for screening, unspecified: Secondary | ICD-10-CM

## 2012-04-04 ENCOUNTER — Encounter (HOSPITAL_COMMUNITY): Payer: Self-pay | Admitting: Dietician

## 2012-04-04 NOTE — Progress Notes (Signed)
Outpatient Initial Nutrition Assessment  Date:04/04/2012   Appt Start Time: 0953  Referring Physician: Inpatient Diabetes Coordinator Reason for Visit: diabetes  Last appointment was 05/21/09 for diabetes class.   Nutrition Assessment:  Height: 5\' 3"  (160 cm)   Weight: 149 lb (67.586 kg)   IBW: 115# %IBW: 129% UBW: 149# %UBW: 100% Body mass index is 26.39 kg/(m^2). Classified as overweight.  Goal Weight: maintainence Weight hx: Stable  Estimated nutritional needs: 1418-1547 kcals daily, 54-68 grams protein daily, 1.4-1.5 L fluid daily  PMH:  Past Medical History  Diagnosis Date  . HTN (hypertension)   . DM (diabetes mellitus)   . Heart block   . Hypothyroidism   . Ovarian tumor   . Lupus   . S/P colonoscopy 2005    Dr. Laural Golden: pancolonic divericula, polyp, path unknown currently  . Coronary artery disease     Medications:  Current Outpatient Rx  Name Route Sig Dispense Refill  . AMLODIPINE BESYLATE 10 MG PO TABS Oral Take 1 tablet (10 mg total) by mouth daily. 30 tablet 11    Dose decrease  . ASPIRIN 325 MG PO TBEC Oral Take 325 mg by mouth daily.      . ATORVASTATIN CALCIUM 40 MG PO TABS Oral Take 20 mg by mouth daily.    [name] BENZONATATE 100 MG PO CAPS Oral Take 1 capsule (100 mg total) by mouth 3 (three) times daily. 20 capsule 0  . CEFUROXIME AXETIL 500 MG PO TABS Oral Take 1 tablet (500 mg total) by mouth 2 (two) times daily. ANTIBIOTIC. 6 tablet 0  . HYDROCOD POLST-CPM POLST ER 10-8 MG/5ML PO LQCR Oral Take 5 mLs by mouth at bedtime as needed (For cough). 115 mL 0  . VITAMIN D 2000 UNITS PO TABS Oral Take 2,000 Units by mouth daily.      . INSULIN GLARGINE 100 UNIT/ML [place] SOLN Subcutaneous Inject 60 Units into the skin at bedtime.     . INSULIN LISPRO (HUMAN) 100 UNIT/ML [place] SOLN Subcutaneous Inject 12-14 Units into the skin 3 (three) times daily before meals. Uses according to sliding scale at home.    [name] LABETALOL HCL 100 MG PO TABS Oral Take 1 tablet (100 mg total) by  mouth 2 (two) times daily. Take with your 400 mg tablet. 60 tablet 3  . LABETALOL HCL 300 MG PO TABS Oral Take 1 tablet (300 mg total) by mouth 2 (two) times daily. Dose increase 60 tablet 12    Dose increase  . LEVALBUTEROL TARTRATE 45 MCG/ACT IN AERO Inhalation Inhale 1-2 puffs into the lungs every 4 (four) hours as needed for wheezing. 1 Inhaler 12  . LEVOTHYROXINE SODIUM 137 MCG PO TABS Oral Take 137 mcg by mouth daily.    [name] LOSARTAN POTASSIUM 100 MG PO TABS Oral Take 100 mg by mouth daily.    [name] METFORMIN HCL 500 MG PO TABS Oral Take 500-1,000 mg by mouth 2 (two) times daily with a meal. Takes 2 tablets in the morning and 1 at night.    [name] NITROGLYCERIN 0.4 MG SL SUBL Sublingual Place 1 tablet (0.4 mg total) under the tongue every 5 (five) minutes as needed for chest pain (1 tablet every 5 minutes No more than 3 total per episode). 25 tablet 12  . OMEGA-3-ACID ETHYL ESTERS 1 G PO CAPS Oral Take 1 capsule (1 g total) by mouth 2 (two) times daily. For treatment of your elevated triglyceride level. 60 capsule 3  . VITAMIN B-12 1000 MCG  PO TABS Oral Take 1 tablet (1,000 mcg total) by mouth daily.      Labs: CMP     Component Value Date/Time   NA 139 03/08/2012 0512   K 4.1 03/08/2012 0512   CL 103 03/08/2012 0512   CO2 27 03/08/2012 0512   GLUCOSE 97 03/08/2012 0512   BUN 13 03/08/2012 0512   CREATININE 0.82 03/08/2012 0512   CREATININE 1.03 08/28/2011 0146   CALCIUM 9.4 03/08/2012 0512   PROT 7.1 02/27/2012 2245   ALBUMIN 4.2 02/27/2012 2245   AST 9 02/27/2012 2245   ALT 14 02/27/2012 2245   ALKPHOS 86 02/27/2012 2245   BILITOT 0.3 02/27/2012 2245   GFRNONAA 73* 03/08/2012 0512   GFRAA 85* 03/08/2012 0512    Lipid Panel     Component Value Date/Time   CHOL 127 02/29/2012 0510   TRIG 392* 02/29/2012 0510   HDL 25* 02/29/2012 0510   CHOLHDL 5.1 02/29/2012 0510   VLDL 78* 02/29/2012 0510   LDLCALC 24 02/29/2012 0510     Lab Results  Component Value Date   HGBA1C 8.5* 02/29/2012   HGBA1C 8.4* 02/28/2012   Lab  Results  Component Value Date   LDLCALC 24 02/29/2012   CREATININE 0.82 03/08/2012     Lifestyle/ social habits: Ms. Patsy lives in [place] with her husband. She reports a stress level "below 5" out of 10, but admits stress level inscreased during recent hospitalization. She exercises 45 minutes 3 times a week in her home gym, where she has a treadmill, bike, and a "low rider". She also walks daily. She has been increasing her exercise for the past 2 weeks.   Nutrition hx/habits: Ms. Winans reports no changes her in diet. She tries to "watch carbs". She reports she has always struggled with glycemic control; she reports she particularly has a "bad time" this year, as her CBG's continue to go "up and down". She checks her CBGs 2-3 times daily and readings typically run in the 170's or higher. She reports that her most recent Hgb A1c at Dr. Almetta Lovely was 7.7, which has improved. She does not consume fried foods, meats are grilled or baked. She consumes water and sugar free beverages. She follows are structured meal schedule. She tries to eat fish 2 x per week. She bakes with splenda; she reports she made an apple pie this weekend, but will consume a very small serving.   Diet recall: Breakfast (8 AM): 1 cup of Special K (cinnamon and pecan flavored), skim or 2% milk, black coffee; 10 AM: diet soda; Lunch (12 PM): sandwich (2 slices whole wheat bread, ham or [place], mayonnaise, tomato), tea sweetened with splenda or diet soda; Snack: pack of crackers; Dinner (6 PM): grilled chicken or grilled salmon/cod; green beans/squash/cabbage/slaw, potaotes  Nutrition Diagnosis: Inconsistent carbohydrate intake r/t diet recall AEB Hgb A1c: 7.7.   Nutrition Intervention: Nutrition rx: 1500 kcal heart healthy, diabetic diet; 3 meals per day (45-60 grams carbohydrate per meal); 1 snack (15-30 grams carbohydrate per snack); low calorie beverages only; 2.5 hours physical activity per week  Education/Counseling  Provided :Educated pt on principles of diabetic diet. Discussed carbohydrate metabolism in relation to diabetes. Educated pt on plate method, portion sizes, and sources of carbohydrate. Discussed importance of regular meal pattern. Discussed importance of adding sources of whole grains to diet to improve glycemic control. Also encouraged to choose low fat dairy, lean meats, and whole fruits and vegetables more often. Educated on food label reading. Discussed nutritional  content of foods commonly eaten and discussed healthier alternatives. Discussed importance of compliance to prevent further complications of disease. Educated pt on importance of physical activity (goal of at least 30 minutes 5 times per week) along with a healthy diet to achieve weight maintainence and glycemic goals. Encouraged weight maintainence. Provided plate method handout and "10 Things You Should Know About Meal Planning" handouts.   Understanding, Motivation, Ability to Follow Recommendations: Expect fair to good compliance.   Monitoring and Evaluation: Goals: 1) Weight maintenance; 2) Hgb A1c < 7.0; 3) 2.5 hours physical activity per week  Recommendations: 1) Break exercise up into smaller, more frequent sessions; 2) Add another carbohydrate source to lunch (ex. Yogurt or piece of fruit) to meet carbohydrate goal for meals; 3) Use measuring cups to ensure proper portions  F/U: PRN. Provided RD contact information.   Joaquim Lai, RD, LDN 04/04/2012  Appt EndTime: [id number]

## 2012-04-07 ENCOUNTER — Encounter: Payer: Self-pay | Admitting: Cardiology

## 2012-04-07 ENCOUNTER — Ambulatory Visit (INDEPENDENT_AMBULATORY_CARE_PROVIDER_SITE_OTHER): Payer: Medicare Other | Admitting: Cardiology

## 2012-04-07 VITALS — BP 138/70 | HR 68 | Ht 63.0 in | Wt 148.0 lb

## 2012-04-07 DIAGNOSIS — I4891 Unspecified atrial fibrillation: Secondary | ICD-10-CM

## 2012-04-07 DIAGNOSIS — E1165 Type 2 diabetes mellitus with hyperglycemia: Secondary | ICD-10-CM

## 2012-04-07 DIAGNOSIS — R7989 Other specified abnormal findings of blood chemistry: Secondary | ICD-10-CM

## 2012-04-07 DIAGNOSIS — I214 Non-ST elevation (NSTEMI) myocardial infarction: Secondary | ICD-10-CM

## 2012-04-07 DIAGNOSIS — I219 Acute myocardial infarction, unspecified: Secondary | ICD-10-CM

## 2012-04-07 DIAGNOSIS — E663 Overweight: Secondary | ICD-10-CM

## 2012-04-07 DIAGNOSIS — I208 Other forms of angina pectoris: Secondary | ICD-10-CM

## 2012-04-07 DIAGNOSIS — I480 Paroxysmal atrial fibrillation: Secondary | ICD-10-CM

## 2012-04-07 DIAGNOSIS — I209 Angina pectoris, unspecified: Secondary | ICD-10-CM

## 2012-04-07 DIAGNOSIS — I2510 Atherosclerotic heart disease of native coronary artery without angina pectoris: Secondary | ICD-10-CM

## 2012-04-07 DIAGNOSIS — I10 Essential (primary) hypertension: Secondary | ICD-10-CM

## 2012-04-07 NOTE — Assessment & Plan Note (Signed)
Stable. Continue current medications. Reinforced the need to continue to lose weight and keep her hemoglobin A1c down.

## 2012-04-07 NOTE — Assessment & Plan Note (Signed)
This was secondary to demand ischemia from rapid A. fib and pneumonia. Clinically stable. Reviewed at length with patient and husband.

## 2012-04-07 NOTE — Assessment & Plan Note (Signed)
Stable. No recurrence. Patient and husband advised that this could occur again with her history of coronary artery disease or could occur in the setting of acute illness.

## 2012-04-07 NOTE — Patient Instructions (Addendum)
Your physician recommends that you schedule a follow-up appointment in: 6 months  

## 2012-04-07 NOTE — Assessment & Plan Note (Signed)
Stable. No change in secondary preventative therapy. Return the office again in 6 months.

## 2012-04-07 NOTE — Progress Notes (Signed)
HPI Virginia Huber returns today for post hospital visit. She developed bronchitis and several days later went into atrial fibrillation. She a rapid ventricular rate. She converted with adenosine in the emergency room. She was admitted and subsequently her chest x-ray showed double pneumonia with bilateral infiltrates. She was in the hospital for 11 days.  She has not been in atrial fib since discharge. She denies any angina.  She and her husband had multiple questions all which I have answered a day. This took about 20 minutes.  Medications renewed. She is compliant. Her hemoglobin A1c since hospitalization has dropped down to 7-1/2% range. She seems to be doing better with her diet. During her hospitalization was 9.4%.  Past Medical History  Diagnosis Date  . HTN (hypertension)   . DM (diabetes mellitus)   . Heart block   . Hypothyroidism   . Ovarian tumor   . Lupus   . S/P colonoscopy 2005    Dr. Laural Golden: pancolonic divericula, polyp, path unknown currently  . Coronary artery disease     Current Outpatient Prescriptions  Medication Sig Dispense Refill  . amLODipine (NORVASC) 10 MG tablet Take 1 tablet (10 mg total) by mouth daily.  30 tablet  11  . aspirin 325 MG EC tablet Take 325 mg by mouth daily.        [name] atorvastatin (LIPITOR) 40 MG tablet Take 20 mg by mouth daily.      . Cholecalciferol (VITAMIN D) 2000 UNITS tablet Take 2,000 Units by mouth daily.        . insulin glargine (LANTUS) 100 UNIT/ML injection Inject 60 Units into the skin at bedtime.       . insulin lispro (HUMALOG) 100 UNIT/ML injection Inject 12-14 Units into the skin 3 (three) times daily before meals. Uses according to sliding scale at home.      . labetalol (NORMODYNE) 100 MG tablet Take 100 mg by mouth 2 (two) times daily. Take with your 300 mg tablet.      [name] labetalol (NORMODYNE) 300 MG tablet Take 1 tablet (300 mg total) by mouth 2 (two) times daily. Dose increase  60 tablet  12  . levothyroxine (SYNTHROID,  LEVOTHROID) 137 MCG tablet Take 137 mcg by mouth daily.      [name] losartan (COZAAR) 100 MG tablet Take 100 mg by mouth daily.      . metFORMIN (GLUCOPHAGE) 500 MG tablet Take 500-1,000 mg by mouth 2 (two) times daily with a meal. Takes 2 tablets in the morning and 1 at night.      . nitroGLYCERIN (NITROSTAT) 0.4 MG SL tablet Place 1 tablet (0.4 mg total) under the tongue every 5 (five) minutes as needed for chest pain (1 tablet every 5 minutes No more than 3 total per episode).  25 tablet  12  . omega-3 acid ethyl esters (LOVAZA) 1 G capsule Take 1 capsule (1 g total) by mouth 2 (two) times daily. For treatment of your elevated triglyceride level.  60 capsule  3  . vitamin B-12 (CYANOCOBALAMIN) 1000 MCG tablet Take 1 tablet (1,000 mcg total) by mouth daily.      [name] DISCONTD: labetalol (NORMODYNE) 100 MG tablet Take 1 tablet (100 mg total) by mouth 2 (two) times daily. Take with your 400 mg tablet.  60 tablet  3    Allergies  Allergen Reactions  . Penicillins Hives  . Percocet (Oxycodone-Acetaminophen) Nausea And Vomiting    Family History  Problem Relation Age of Onset  . Heart  disease Mother     deceased  . Heart disease Father     deceased, heart disease  . Colon cancer Neg Hx     History   Social History  . Marital Status: Married    Spouse Name: N/A    Number of Children: N/A  . Years of Education: N/A   Occupational History  . Retired     Research officer, political party   Social History Main Topics  . Smoking status: Never Smoker   . Smokeless tobacco: Not on file  . Alcohol Use: No  . Drug Use: No  . Sexually Active: Not on file   Other Topics Concern  . Not on file   Social History Narrative  . No narrative on file    ROS ALL NEGATIVE EXCEPT THOSE NOTED IN HPI  PE  General Appearance: well developed, well nourished in no acute distress, overweight HEENT: symmetrical face, PERRLA, good dentition  Neck: no JVD, thyromegaly, or adenopathy, trachea midline Chest: symmetric  without deformity Cardiac: PMI non-displaced, RRR, normal S1, S2, no gallop or murmur Lung: clear to ausculation and percussion Vascular: all pulses full without bruits  Abdominal: nondistended, nontender, good bowel sounds, no HSM, no bruits Extremities: no cyanosis, clubbing or edema, no sign of DVT, no varicosities  Skin: normal color, no rashes Neuro: alert and oriented x 3, non-focal Pysch: normal affect  EKG  BMET    Component Value Date/Time   NA 139 03/08/2012 0512   K 4.1 03/08/2012 0512   CL 103 03/08/2012 0512   CO2 27 03/08/2012 0512   GLUCOSE 97 03/08/2012 0512   BUN 13 03/08/2012 0512   CREATININE 0.82 03/08/2012 0512   CREATININE 1.03 08/28/2011 0146   CALCIUM 9.4 03/08/2012 0512   GFRNONAA 73* 03/08/2012 0512   GFRAA 85* 03/08/2012 0512    Lipid Panel     Component Value Date/Time   CHOL 127 02/29/2012 0510   TRIG 392* 02/29/2012 0510   HDL 25* 02/29/2012 0510   CHOLHDL 5.1 02/29/2012 0510   VLDL 78* 02/29/2012 0510   LDLCALC 24 02/29/2012 0510    CBC    Component Value Date/Time   WBC 10.6* 03/08/2012 0512   RBC 3.29* 03/08/2012 0512   HGB 10.0* 03/08/2012 0512   HCT 30.2* 03/08/2012 0512   PLT 356 03/08/2012 0512   MCV 91.8 03/08/2012 0512   MCH 30.4 03/08/2012 0512   MCHC 33.1 03/08/2012 0512   RDW 13.3 03/08/2012 0512   LYMPHSABS 1.2 03/01/2012 2126   MONOABS 0.9 03/01/2012 2126   EOSABS 0.1 03/01/2012 2126   BASOSABS 0.0 03/01/2012 2126

## 2012-04-14 ENCOUNTER — Ambulatory Visit (HOSPITAL_COMMUNITY)
Admission: RE | Admit: 2012-04-14 | Discharge: 2012-04-14 | Disposition: A | Discharge status: Home / Self Care | Payer: Medicare Other | Source: Ambulatory Visit | Attending: Obstetrics and Gynecology | Admitting: Obstetrics and Gynecology

## 2012-04-14 DIAGNOSIS — Z1231 Encounter for screening mammogram for malignant neoplasm of breast: Secondary | ICD-10-CM | POA: Insufficient documentation

## 2012-04-14 DIAGNOSIS — Z139 Encounter for screening, unspecified: Secondary | ICD-10-CM

## 2012-06-20 ENCOUNTER — Other Ambulatory Visit: Payer: Self-pay | Admitting: Cardiology

## 2012-06-20 NOTE — Telephone Encounter (Signed)
PT NEEDS ALL MEDS CALLED IN TO MAIL ORDER FOR 3 MONTH SUPPLY.  SHE DID NOT LEAVE WHAT PHARMACY SHE IS GOING TO BE USING.   I HAVE ALREADY CALLED AND LEFT MESSAGE FOR HER TO CALL BACK WITH THIS INFORMATION

## 2012-06-21 MED ORDER — LABETALOL HCL 300 MG PO TABS: 300.0000 mg | ORAL_TABLET | Freq: Two times a day (BID) | ORAL | Status: DC

## 2012-06-21 MED ORDER — AMLODIPINE BESYLATE 10 MG PO TABS: 10.0000 mg | ORAL_TABLET | Freq: Every day | ORAL | Status: DC

## 2012-06-21 MED ORDER — LOSARTAN POTASSIUM 100 MG PO TABS: 100.0000 mg | ORAL_TABLET | Freq: Every day | ORAL | Status: DC

## 2012-06-21 MED ORDER — LABETALOL HCL 100 MG PO TABS: 100.0000 mg | ORAL_TABLET | Freq: Two times a day (BID) | ORAL | Status: DC

## 2012-06-21 MED ORDER — ATORVASTATIN CALCIUM 20 MG PO TABS: 20.0000 mg | ORAL_TABLET | Freq: Every day | ORAL | Status: DC

## 2012-06-21 MED ORDER — NITROGLYCERIN 0.4 MG SL SUBL: 0.4000 mg | SUBLINGUAL_TABLET | SUBLINGUAL | Status: DC | PRN

## 2012-06-21 NOTE — Telephone Encounter (Signed)
Received written authorization to have pt medications printed to be signed by [facility] NP and pt will pick up at front desk to have to mail to new mail order pharmacy that she will have to change to mail order beginning in January, pt clarified that she is taking 20mg  of lipitor daily and thus advised she will need to take the 20mg  tablets going forward instead of halfing the 40mg  tabs as before, pt understood, printed refills for 90 day supplies on norvasc,laetalol 300mg  and 100mg  tablet forms, cozaar,nitro and lipitor, pt understood all instructions

## 2012-08-06 ENCOUNTER — Other Ambulatory Visit: Payer: Self-pay

## 2012-11-01 ENCOUNTER — Encounter: Payer: Self-pay | Admitting: Cardiology

## 2012-11-01 ENCOUNTER — Ambulatory Visit (INDEPENDENT_AMBULATORY_CARE_PROVIDER_SITE_OTHER): Payer: Medicare Other | Admitting: Cardiology

## 2012-11-01 VITALS — BP 122/70 | HR 57 | Ht 63.0 in | Wt 146.8 lb

## 2012-11-01 DIAGNOSIS — I2510 Atherosclerotic heart disease of native coronary artery without angina pectoris: Secondary | ICD-10-CM

## 2012-11-01 DIAGNOSIS — I480 Paroxysmal atrial fibrillation: Secondary | ICD-10-CM

## 2012-11-01 DIAGNOSIS — I10 Essential (primary) hypertension: Secondary | ICD-10-CM

## 2012-11-01 DIAGNOSIS — R079 Chest pain, unspecified: Secondary | ICD-10-CM

## 2012-11-01 DIAGNOSIS — I4891 Unspecified atrial fibrillation: Secondary | ICD-10-CM

## 2012-11-01 DIAGNOSIS — I2089 Other forms of angina pectoris: Secondary | ICD-10-CM

## 2012-11-01 DIAGNOSIS — I209 Angina pectoris, unspecified: Secondary | ICD-10-CM

## 2012-11-01 DIAGNOSIS — E785 Hyperlipidemia, unspecified: Secondary | ICD-10-CM

## 2012-11-01 DIAGNOSIS — I208 Other forms of angina pectoris: Secondary | ICD-10-CM

## 2012-11-01 DIAGNOSIS — E663 Overweight: Secondary | ICD-10-CM

## 2012-11-01 NOTE — Assessment & Plan Note (Signed)
No clinical recurrence.

## 2012-11-01 NOTE — Assessment & Plan Note (Signed)
Stable. Continue current medical therapy and secondary prevention. Return the office in one year.

## 2012-11-01 NOTE — Progress Notes (Signed)
HPI Virginia Huber returns today for evaluation and management history of coronary artery disease, one episode of paroxysmal A. fib without recurrence of acute bronchitis, history hypertension hyperlipidemia, and type 2 diabetes mellitus.  She is stable angina. Her blood sugars been under much better control with adjustment of her regimen adding Byetta. Her weight is stable or decreased. She's very compliant with her meds.  Her last Myoview was in 2013 which showed minimal inferior Mima [place] ischemia. She has circumflex disease. Medical therapy continued.  Past Medical History  Diagnosis Date  . HTN (hypertension)   . DM (diabetes mellitus)   . Heart block   . Hypothyroidism   . Ovarian tumor   . Lupus   . S/P colonoscopy 2005    Dr. Laural Golden: pancolonic divericula, polyp, path unknown currently  . Coronary artery disease     Current Outpatient Prescriptions  Medication Sig Dispense Refill  . amLODipine (NORVASC) 10 MG tablet Take 1 tablet (10 mg total) by mouth daily.  90 tablet  1  . aspirin 325 MG EC tablet Take 325 mg by mouth daily.        [name] atorvastatin (LIPITOR) 20 MG tablet Take 1 tablet (20 mg total) by mouth daily.  90 tablet  1  . Cholecalciferol (VITAMIN D) 2000 UNITS tablet Take 2,000 Units by mouth daily.        [name] exenatide (BYETTA) 5 MCG/0.02ML SOPN Inject 5 mcg into the skin 2 (two) times daily with a meal.      . insulin NPH-regular (NOVOLIN 70/30) (70-30) 100 UNIT/ML injection Inject 30 Units into the skin 2 (two) times daily with a meal.      . labetalol (NORMODYNE) 100 MG tablet Take 1 tablet (100 mg total) by mouth 2 (two) times daily. Take with your 300 mg tablet.  180 tablet  1  . labetalol (NORMODYNE) 300 MG tablet Take 1 tablet (300 mg total) by mouth 2 (two) times daily. Dose increase  180 tablet  1  . levothyroxine (SYNTHROID, LEVOTHROID) 150 MCG tablet Take 150 mcg by mouth daily before breakfast.      . losartan (COZAAR) 100 MG tablet Take 1 tablet (100 mg total) by  mouth daily.  90 tablet  1  . metFORMIN (GLUCOPHAGE) 500 MG tablet Take 500-1,000 mg by mouth 2 (two) times daily with a meal. Takes 2 tablets in the morning and 1 at night.      . nitroGLYCERIN (NITROSTAT) 0.4 MG SL tablet Place 1 tablet (0.4 mg total) under the tongue every 5 (five) minutes as needed for chest pain (1 tablet every 5 minutes No more than 3 total per episode).  25 tablet  3   No current facility-administered medications for this visit.    Allergies  Allergen Reactions  . Penicillins Hives  . Percocet (Oxycodone-Acetaminophen) Nausea And Vomiting    Family History  Problem Relation Age of Onset  . Heart disease Mother     deceased  . Heart disease Father     deceased, heart disease  . Colon cancer Neg Hx     History   Social History  . Marital Status: Married    Spouse Name: N/A    Number of Children: N/A  . Years of Education: N/A   Occupational History  . Retired     Research officer, political party   Social History Main Topics  . Smoking status: Never Smoker   . Smokeless tobacco: Not on file  . Alcohol Use: No  . Drug  Use: No  . Sexually Active: Not on file   Other Topics Concern  . Not on file   Social History Narrative  . No narrative on file    ROS ALL NEGATIVE EXCEPT THOSE NOTED IN HPI  PE  General Appearance: well developed, well nourished in no acute distress HEENT: symmetrical face, PERRLA, good dentition  Neck: no JVD, thyromegaly, or adenopathy, trachea midline Chest: symmetric without deformity Cardiac: PMI non-displaced, RRR, normal S1, S2, no gallop or murmur Lung: clear to ausculation and percussion Vascular: all pulses full without bruits  Abdominal: nondistended, nontender, good bowel sounds, no HSM, no bruits Extremities: no cyanosis, clubbing or edema, no sign of DVT, no varicosities  Skin: normal color, no rashes Neuro: alert and oriented x 3, non-focal Pysch: normal affect  EKG Normal sinus rhythm, LVH with ST changes  laterally. No change from previous EKG. BMET    Component Value Date/Time   NA 139 03/08/2012 0512   K 4.1 03/08/2012 0512   CL 103 03/08/2012 0512   CO2 27 03/08/2012 0512   GLUCOSE 97 03/08/2012 0512   BUN 13 03/08/2012 0512   CREATININE 0.82 03/08/2012 0512   CREATININE 1.03 08/28/2011 0146   CALCIUM 9.4 03/08/2012 0512   GFRNONAA 73* 03/08/2012 0512   GFRAA 85* 03/08/2012 0512    Lipid Panel     Component Value Date/Time   CHOL 127 02/29/2012 0510   TRIG 392* 02/29/2012 0510   HDL 25* 02/29/2012 0510   CHOLHDL 5.1 02/29/2012 0510   VLDL 78* 02/29/2012 0510   LDLCALC 24 02/29/2012 0510    CBC    Component Value Date/Time   WBC 10.6* 03/08/2012 0512   RBC 3.29* 03/08/2012 0512   HGB 10.0* 03/08/2012 0512   HCT 30.2* 03/08/2012 0512   PLT 356 03/08/2012 0512   MCV 91.8 03/08/2012 0512   MCH 30.4 03/08/2012 0512   MCHC 33.1 03/08/2012 0512   RDW 13.3 03/08/2012 0512   LYMPHSABS 1.2 03/01/2012 2126   MONOABS 0.9 03/01/2012 2126   EOSABS 0.1 03/01/2012 2126   BASOSABS 0.0 03/01/2012 2126

## 2012-11-01 NOTE — Patient Instructions (Signed)
Your physician wants you to follow-up in: 1 year with Dr. Harl Bowie. You will receive a reminder letter in the mail two months in advance. If you don't receive a letter, please call our office to schedule the follow-up appointment.

## 2012-12-26 ENCOUNTER — Other Ambulatory Visit: Payer: Self-pay | Admitting: *Deleted

## 2012-12-26 ENCOUNTER — Telehealth: Payer: Self-pay | Admitting: Cardiology

## 2012-12-26 MED ORDER — AMLODIPINE BESYLATE 10 MG PO TABS: 10.0000 mg | ORAL_TABLET | Freq: Every day | ORAL | Status: DC

## 2012-12-26 MED ORDER — LABETALOL HCL 100 MG PO TABS: 100.0000 mg | ORAL_TABLET | Freq: Two times a day (BID) | ORAL | Status: DC

## 2012-12-26 NOTE — Telephone Encounter (Signed)
Needs Amlodopine 10mg  and Labetalol 100mg  sent to [facility]. / tgs

## 2012-12-26 NOTE — Telephone Encounter (Signed)
Medication sent via escribe.  

## 2013-01-25 ENCOUNTER — Other Ambulatory Visit: Payer: Self-pay

## 2013-02-03 ENCOUNTER — Other Ambulatory Visit: Payer: Self-pay | Admitting: *Deleted

## 2013-02-03 MED ORDER — LOSARTAN POTASSIUM 100 MG PO TABS: 100.0000 mg | ORAL_TABLET | Freq: Every day | ORAL | Status: DC

## 2013-02-23 ENCOUNTER — Other Ambulatory Visit: Payer: Self-pay | Admitting: Cardiology

## 2013-02-23 ENCOUNTER — Telehealth: Payer: Self-pay | Admitting: *Deleted

## 2013-02-23 MED ORDER — LABETALOL HCL 300 MG PO TABS: 300.0000 mg | ORAL_TABLET | Freq: Two times a day (BID) | ORAL | Status: DC

## 2013-02-23 NOTE — Telephone Encounter (Signed)
PT NEEDS LABETALOL 300MG  CALLED IN TO [facility]

## 2013-04-18 IMAGING — CR DG CHEST 2V
2 series · 2 of 2 positions shown · non-contrast
Comparison: 03/05/2012

CLINICAL DATA: Follow up pneumonia, cough

CHEST - 2 VIEW

[view not recorded (1 of 2)]
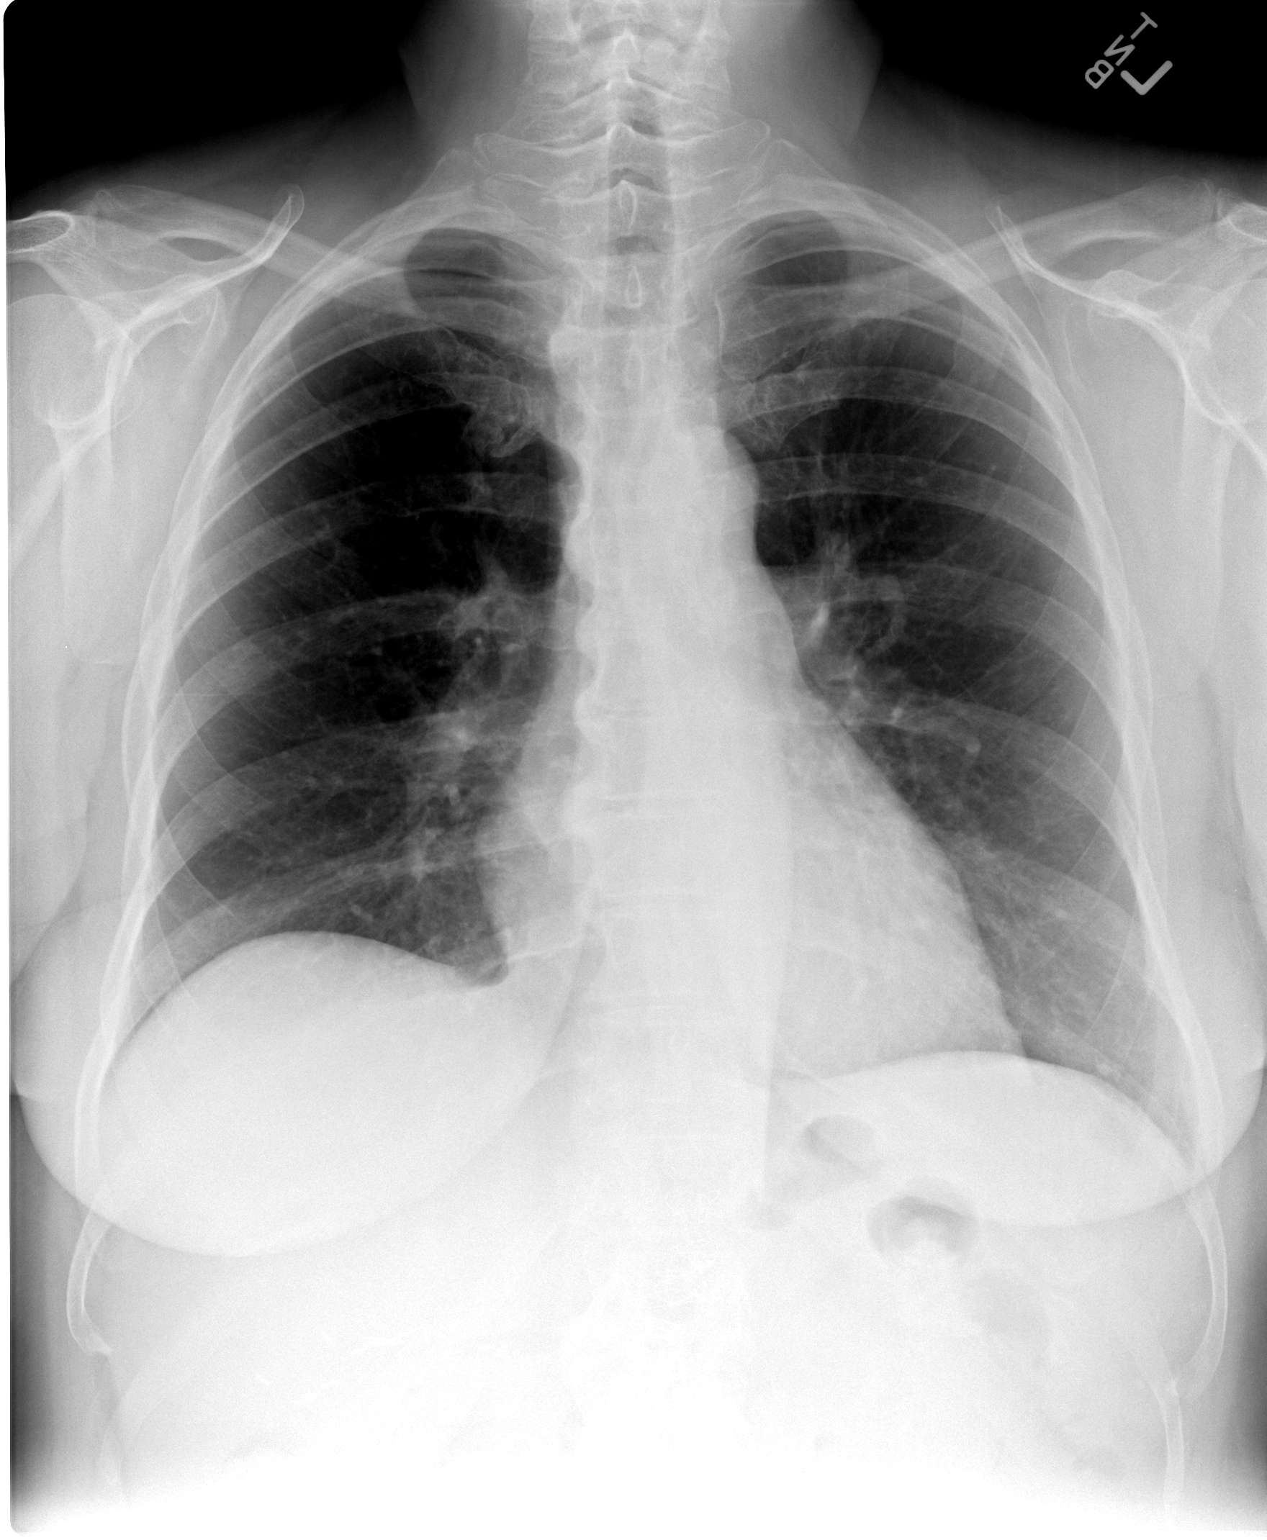

[view not recorded (2 of 2)]
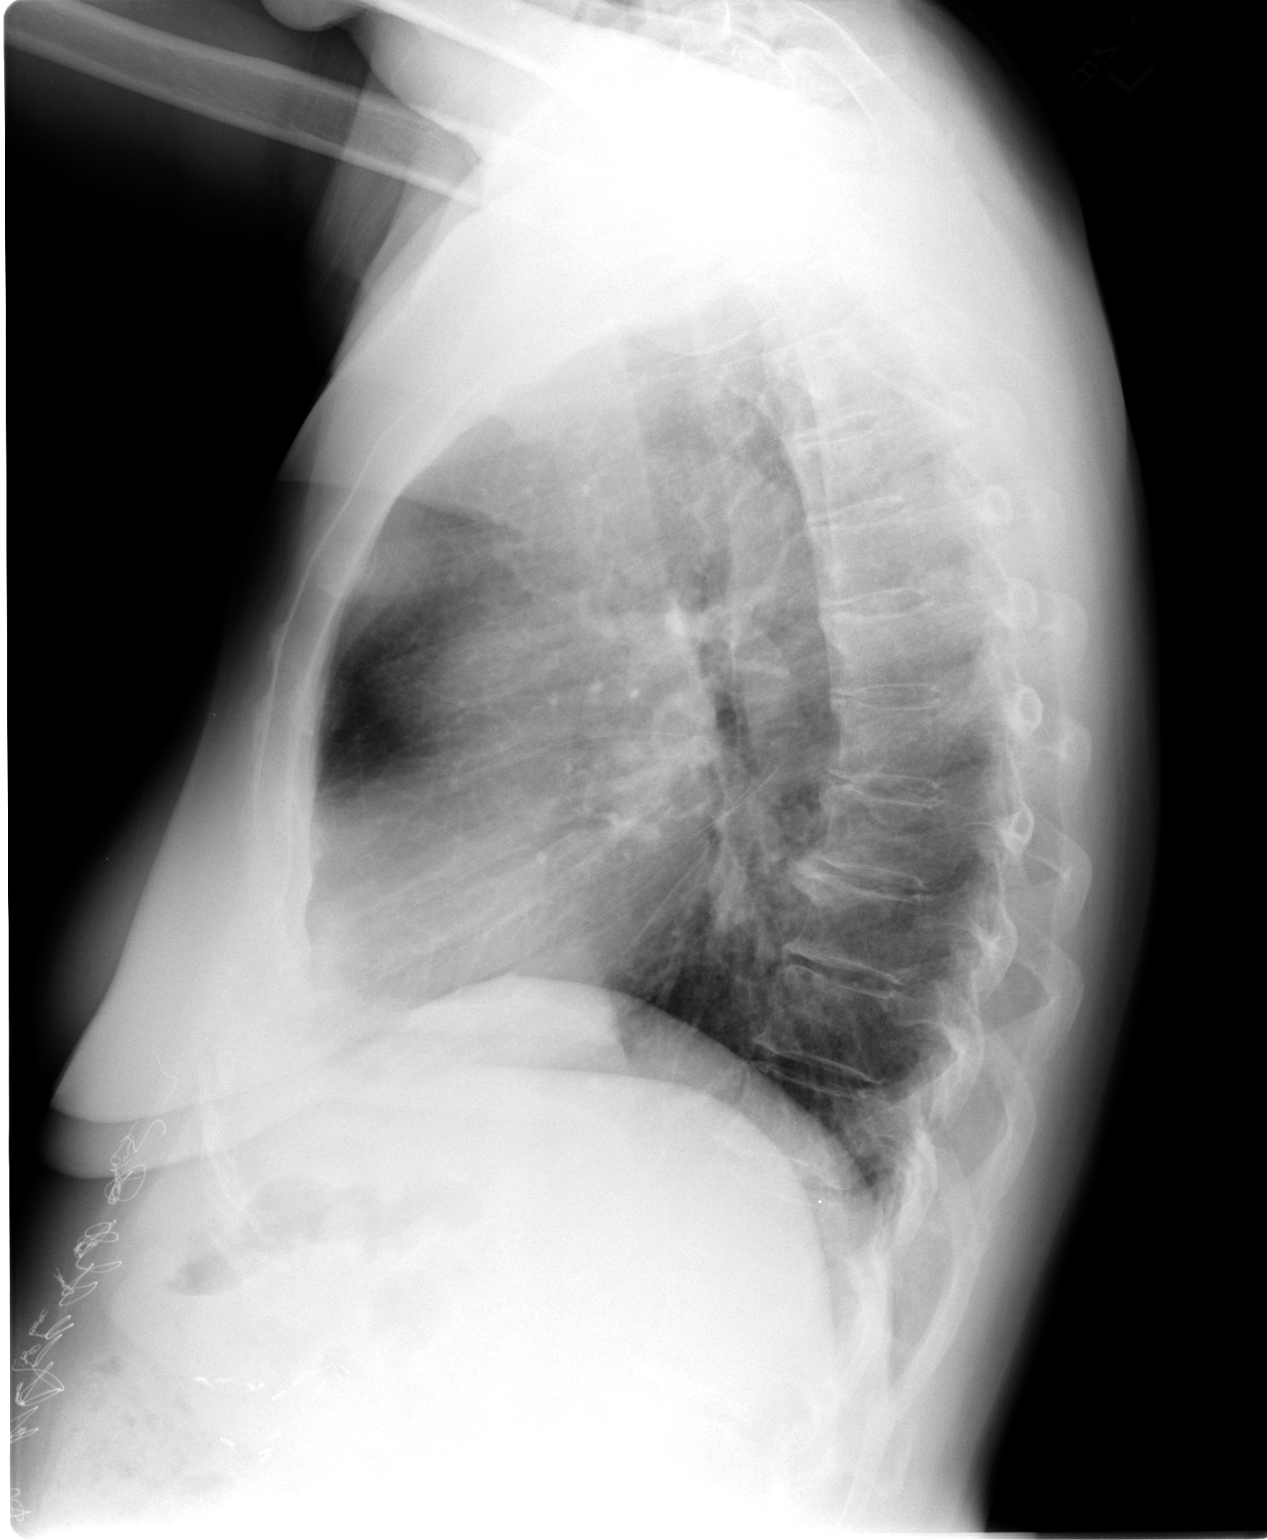

[2 of 2 positions shown; findings below may reference images not displayed]

FINDINGS: Upper-normal size of cardiac silhouette.
Mediastinal contours and pulmonary vascularity normal.
Lungs clear.
Minimal peribronchial thickening.
No pleural effusion or pneumothorax.
Scattered end plate spur formation thoracic spine.
IMPRESSION: Resolution of previously identified pulmonary infiltrates.

## 2013-04-26 ENCOUNTER — Telehealth: Payer: Self-pay | Admitting: Cardiology

## 2013-04-26 NOTE — Telephone Encounter (Signed)
Received fax refill request  Rx # [id number] Medication:  Labetalol HCL 300mg  tab Qty 60 Sig:  Take one tablet twice daily Physician:  Harl Bowie

## 2013-04-27 ENCOUNTER — Other Ambulatory Visit: Payer: Self-pay

## 2013-04-27 ENCOUNTER — Telehealth: Payer: Self-pay | Admitting: Cardiology

## 2013-04-27 MED ORDER — LABETALOL HCL 300 MG PO TABS: 300.0000 mg | ORAL_TABLET | Freq: Two times a day (BID) | ORAL | Status: DC

## 2013-04-27 NOTE — Telephone Encounter (Signed)
Medication sent via escribe.  

## 2013-04-27 NOTE — Telephone Encounter (Signed)
Received fax refill request  Rx # [id number] Medication:  Labetalol HCL 300mg  tab Qty 60 Sig:  Take one tablet twice daily Physician:  Harl Bowie

## 2013-05-08 ENCOUNTER — Telehealth: Payer: Self-pay | Admitting: Adult Health

## 2013-05-08 ENCOUNTER — Encounter: Payer: Self-pay | Admitting: Adult Health

## 2013-05-08 ENCOUNTER — Ambulatory Visit (INDEPENDENT_AMBULATORY_CARE_PROVIDER_SITE_OTHER): Payer: Medicare Other | Admitting: Adult Health

## 2013-05-08 VITALS — BP 120/70 | Ht 63.0 in | Wt 148.0 lb

## 2013-05-08 DIAGNOSIS — Z1389 Encounter for screening for other disorder: Secondary | ICD-10-CM

## 2013-05-08 DIAGNOSIS — N390 Urinary tract infection, site not specified: Secondary | ICD-10-CM

## 2013-05-08 HISTORY — DX: Urinary tract infection, site not specified: N39.0

## 2013-05-08 LAB — POCT URINALYSIS DIPSTICK
Glucose, UA: NEGATIVE
Ketones, UA: NEGATIVE
Leukocytes, UA: NEGATIVE

## 2013-05-08 MED ORDER — CIPROFLOXACIN HCL 500 MG PO TABS: 500.0000 mg | ORAL_TABLET | Freq: Two times a day (BID) | ORAL | Status: DC

## 2013-05-08 NOTE — Progress Notes (Signed)
Subjective:     Patient ID: Virginia Huber, female   DOB: Jun 05, 1946, 67 y.o.   MRN: [id number]  HPI Virginia Huber is a 67 year old white female in complaining of urinary frequency and burning.She says she has had about 7 UTIs this year.She was treated last by Dr Virginia Huber with sulfa drug.She noticed odor last night.   Review of Systems See HPI Reviewed past medical,surgical, social and family history. Reviewed medications and allergies.     Objective:   Physical Exam BP 120/70  Ht 5\' 3"  (1.6 m)  Wt 148 lb (67.132 kg)  BMI 26.22 kg/m2   Urine has trace protein, No CVAT, Skin warm and dry.Pelvic: external genitalia is normal in appearance, vagina: atrophic, cervix and uterus are absent, adnexa: no masses or tenderness noted. But she is tender over bladder.  Assessment:    UTI     Plan:    Urine sent for C&S Rx Cipro 500 mg #14 1 bid x 7 days Increase fluids Follow up in 2 weeks, if not better will refer to Dr Virginia Huber

## 2013-05-08 NOTE — Patient Instructions (Signed)
Urinary Tract Infection Urinary tract infections (UTIs) can develop anywhere along your urinary tract. Your urinary tract is your body's drainage system for removing wastes and extra water. Your urinary tract includes two kidneys, two ureters, a bladder, and a urethra. Your kidneys are a pair of bean-shaped organs. Each kidney is about the size of your fist. They are located below your ribs, one on each side of your spine. CAUSES Infections are caused by microbes, which are microscopic organisms, including fungi, viruses, and bacteria. These organisms are so small that they can only be seen through a microscope. Bacteria are the microbes that most commonly cause UTIs. SYMPTOMS  Symptoms of UTIs may vary by age and gender of the patient and by the location of the infection. Symptoms in young women typically include a frequent and intense urge to urinate and a painful, burning feeling in the bladder or urethra during urination. Older women and men are more likely to be tired, shaky, and weak and have muscle aches and abdominal pain. A fever may mean the infection is in your kidneys. Other symptoms of a kidney infection include pain in your back or sides below the ribs, nausea, and vomiting. DIAGNOSIS To diagnose a UTI, your caregiver will ask you about your symptoms. Your caregiver also will ask to provide a urine sample. The urine sample will be tested for bacteria and white blood cells. White blood cells are made by your body to help fight infection. TREATMENT  Typically, UTIs can be treated with medication. Because most UTIs are caused by a bacterial infection, they usually can be treated with the use of antibiotics. The choice of antibiotic and length of treatment depend on your symptoms and the type of bacteria causing your infection. HOME CARE INSTRUCTIONS  If you were prescribed antibiotics, take them exactly as your caregiver instructs you. Finish the medication even if you feel better after you  have only taken some of the medication.  Drink enough water and fluids to keep your urine clear or pale yellow.  Avoid caffeine, tea, and carbonated beverages. They tend to irritate your bladder.  Empty your bladder often. Avoid holding urine for long periods of time.  Empty your bladder before and after sexual intercourse.  After a bowel movement, women should cleanse from front to back. Use each tissue only once. SEEK MEDICAL CARE IF:   You have back pain.  You develop a fever.  Your symptoms do not begin to resolve within 3 days. SEEK IMMEDIATE MEDICAL CARE IF:   You have severe back pain or lower abdominal pain.  You develop chills.  You have nausea or vomiting.  You have continued burning or discomfort with urination. MAKE SURE YOU:   Understand these instructions.  Will watch your condition.  Will get help right away if you are not doing well or get worse. Document Released: 03/18/2005 Document Revised: 12/08/2011 Document Reviewed: 07/17/2011 [facility] Patient Information 2014 [place]. Increase fluids Take cirpo 1 bid x 7 days

## 2013-05-08 NOTE — Telephone Encounter (Signed)
Pt states having a lot of UTI this past year, been on several abx. Pt states thinks has another UTI now. Call transferred to front staff for appt with Derrek Monaco, NP to be scheduled today or tomorrow.

## 2013-05-10 ENCOUNTER — Telehealth: Payer: Self-pay | Admitting: Adult Health

## 2013-05-10 LAB — URINE CULTURE: Colony Count: >=100000

## 2013-05-10 NOTE — Telephone Encounter (Signed)
Left message that UA culture back and is sensitive to Cipro

## 2013-05-22 ENCOUNTER — Encounter: Payer: Self-pay | Admitting: Adult Health

## 2013-05-22 ENCOUNTER — Ambulatory Visit (INDEPENDENT_AMBULATORY_CARE_PROVIDER_SITE_OTHER): Payer: Medicare Other | Admitting: Adult Health

## 2013-05-22 ENCOUNTER — Ambulatory Visit: Payer: Medicare Other | Admitting: Adult Health

## 2013-05-22 VITALS — BP 146/72 | Ht 63.0 in | Wt 147.0 lb

## 2013-05-22 DIAGNOSIS — R809 Proteinuria, unspecified: Secondary | ICD-10-CM

## 2013-05-22 DIAGNOSIS — N390 Urinary tract infection, site not specified: Secondary | ICD-10-CM

## 2013-05-22 NOTE — Progress Notes (Signed)
Subjective:     Patient ID: Virginia Huber, female   DOB: [birth year], 67 y.o.   MRN: [id number]  HPI Virginia Huber is back in follow up of recent UTI that showed Klebsiella and was treated with Cipro which it was sensitive. But she says she has several UTIs this year.She feels better,but not 100%.  Review of Systems See HPI Reviewed past medical,surgical, social and family history. Reviewed medications and allergies.     Objective:   Physical Exam BP 146/72  Ht 5\' 3"  (1.6 m)  Wt 147 lb (66.679 kg)  BMI 26.05 kg/m2Urine trace protein and glucose.    Assessment:     Proteinuria Recent UTI    Plan:     Continue with increased fluids Urine sent for culture Follow up by phone later this week, if persists refer to Dr Jeffie Pollock

## 2013-05-22 NOTE — Addendum Note (Signed)
Addended by: Derrek Monaco A on: 05/22/2013 01:30 PM   Modules accepted: Orders

## 2013-05-22 NOTE — Patient Instructions (Signed)
Continue with fluids Will follow up by phone

## 2013-05-23 ENCOUNTER — Other Ambulatory Visit: Payer: Self-pay | Admitting: Obstetrics and Gynecology

## 2013-05-23 DIAGNOSIS — Z139 Encounter for screening, unspecified: Secondary | ICD-10-CM

## 2013-05-24 ENCOUNTER — Other Ambulatory Visit: Payer: Self-pay | Admitting: Adult Health

## 2013-05-24 ENCOUNTER — Telehealth: Payer: Self-pay | Admitting: Adult Health

## 2013-05-24 LAB — URINE CULTURE: Colony Count: >=100000

## 2013-05-24 MED ORDER — SULFAMETHOXAZOLE-TMP DS 800-160 MG PO TABS: 1.0000 | ORAL_TABLET | Freq: Two times a day (BID) | ORAL | Status: DC

## 2013-05-24 NOTE — Telephone Encounter (Signed)
Pt aware of urine +Klebsiella still will rx septra ds 1 bid x 14 days reculture after treatment and I made appt for 06/30/13 with Dr Jeffie Pollock in [place] at 10:30 am.

## 2013-05-25 ENCOUNTER — Ambulatory Visit (HOSPITAL_COMMUNITY)
Admission: RE | Admit: 2013-05-25 | Discharge: 2013-05-25 | Disposition: A | Discharge status: Home / Self Care | Payer: Medicare Other | Source: Ambulatory Visit | Attending: Obstetrics and Gynecology | Admitting: Obstetrics and Gynecology

## 2013-05-25 DIAGNOSIS — Z1231 Encounter for screening mammogram for malignant neoplasm of breast: Secondary | ICD-10-CM | POA: Insufficient documentation

## 2013-05-25 DIAGNOSIS — Z139 Encounter for screening, unspecified: Secondary | ICD-10-CM

## 2013-06-12 ENCOUNTER — Other Ambulatory Visit: Payer: Medicare Other

## 2013-06-17 ENCOUNTER — Telehealth: Payer: Self-pay | Admitting: Adult Health

## 2013-06-17 NOTE — Telephone Encounter (Signed)
Left message that urine culture was negative.

## 2013-06-22 ENCOUNTER — Encounter: Payer: Self-pay | Admitting: Adult Health

## 2013-06-29 ENCOUNTER — Ambulatory Visit (INDEPENDENT_AMBULATORY_CARE_PROVIDER_SITE_OTHER): Payer: Medicare Other | Admitting: Adult Health

## 2013-06-29 ENCOUNTER — Encounter: Payer: Self-pay | Admitting: Adult Health

## 2013-06-29 VITALS — BP 122/56 | HR 72 | Ht 62.5 in | Wt 147.5 lb

## 2013-06-29 DIAGNOSIS — Z87440 Personal history of urinary (tract) infections: Secondary | ICD-10-CM

## 2013-06-29 DIAGNOSIS — B369 Superficial mycosis, unspecified: Secondary | ICD-10-CM

## 2013-06-29 DIAGNOSIS — Z1389 Encounter for screening for other disorder: Secondary | ICD-10-CM

## 2013-06-29 DIAGNOSIS — B379 Candidiasis, unspecified: Secondary | ICD-10-CM

## 2013-06-29 DIAGNOSIS — Z1272 Encounter for screening for malignant neoplasm of vagina: Secondary | ICD-10-CM

## 2013-06-29 DIAGNOSIS — Z01419 Encounter for gynecological examination (general) (routine) without abnormal findings: Secondary | ICD-10-CM

## 2013-06-29 HISTORY — DX: Superficial mycosis, unspecified: B36.9

## 2013-06-29 LAB — HEMOCCULT GUIAC POC 1CARD (OFFICE): FECAL OCCULT BLD: NEGATIVE

## 2013-06-29 LAB — POCT URINALYSIS DIPSTICK
Glucose, UA: NEGATIVE
KETONES UA: NEGATIVE
Leukocytes, UA: NEGATIVE
Nitrite, UA: NEGATIVE
RBC UA: NEGATIVE

## 2013-06-29 MED ORDER — NYSTATIN-TRIAMCINOLONE 100000-0.1 UNIT/GM-% EX OINT: 1.0000 "application " | TOPICAL_OINTMENT | Freq: Two times a day (BID) | CUTANEOUS | Status: DC

## 2013-06-29 NOTE — Patient Instructions (Signed)
Physical in 2 years Mammogram yearly Colonoscopy per GI Labs with PCP See urologist in am Call prn problems or concerns Yeast Infection of the Skin Some yeast on the skin is normal, but sometimes it causes an infection. If you have a yeast infection, it shows up as white or light brown patches on brown skin. You can see it better in the summer on tan skin. It causes light-colored holes in your suntan. It can happen on any area of the body. This cannot be passed from person to person. HOME CARE  Scrub your skin daily with a dandruff shampoo. Your rash may take a couple weeks to get well.  Do not scratch or itch the rash. GET HELP RIGHT AWAY IF:   You get another infection from scratching. The skin may get warm, red, and may ooze fluid.  The infection does not seem to be getting better. MAKE SURE YOU:  Understand these instructions.  Will watch your condition.  Will get help right away if you are not doing well or get worse. Document Released: 05/21/2008 Document Revised: 08/31/2011 Document Reviewed: 05/21/2008 [facility] Patient Information 2014 [place].

## 2013-06-29 NOTE — Progress Notes (Signed)
Patient ID: Virginia Huber, female   DOB: 06-06-1946, 68 y.o.   MRN: [id number] History of Present Illness: Hugh is a 68 year old white female, married in for physical.No complaints.Has appt with urologist in am, has had several UTIs last year, last urine showed no growth after treatment.Got flu shot at diabetic doctor, and has had pneumonia shot in past.   Current Medications, Allergies, Past Medical History, Past Surgical History, Family History and Social History were reviewed in [employer] record.   Past Medical History  Diagnosis Date  . HTN (hypertension)   . DM (diabetes mellitus)   . Heart block   . Hypothyroidism   . Ovarian tumor   . Lupus   . S/P colonoscopy 2005    Dr. Laural Golden: pancolonic divericula, polyp, path unknown currently  . Coronary artery disease   . UTI (urinary tract infection) 05/08/2013  . Superficial fungus infection of skin 06/29/2013   Past Surgical History  Procedure Laterality Date  . Cholecystectomy    . Colostomy      then reversed  . Partial hysterectomy      left ovaries, then ovaries removed later due tumors   . Oophorectomy      1980, nicked bowel, peritonitis, colostomy, now colostomy reversed   . Eye surgeries    . Cardiac catheterization  2008    Tiny OM-2 with 90% narrowing. Med tx.  . Nuclear med stress test  10/2011    Small area of mild ischemia inferoapically.  Current outpatient prescriptions:amLODipine (NORVASC) 10 MG tablet, Take 1 tablet (10 mg total) by mouth daily., Disp: 90 tablet, Rfl: 1;  aspirin 325 MG EC tablet, Take 325 mg by mouth daily.  , Disp: , Rfl: ;  atorvastatin (LIPITOR) 20 MG tablet, Take 1 tablet (20 mg total) by mouth daily., Disp: 90 tablet, Rfl: 1;  Cholecalciferol (VITAMIN D) 2000 UNITS tablet, Take 2,000 Units by mouth daily.  , Disp: , Rfl:  insulin NPH-regular (NOVOLIN 70/30) (70-30) 100 UNIT/ML injection, Inject 30 Units into the skin 2 (two) times daily with a meal., Disp: , Rfl: ;   labetalol (NORMODYNE) 300 MG tablet, Take 1 tablet (300 mg total) by mouth 2 (two) times daily. Dose increase, Disp: 180 tablet, Rfl: 1;  levothyroxine (SYNTHROID, LEVOTHROID) 137 MCG tablet, Take 137 mcg by mouth daily before breakfast., Disp: , Rfl:  losartan (COZAAR) 100 MG tablet, Take 1 tablet (100 mg total) by mouth daily., Disp: 90 tablet, Rfl: 1;  metFORMIN (GLUCOPHAGE) 500 MG tablet, Take 500-1,000 mg by mouth 2 (two) times daily with a meal. Takes 2 tablets in the morning and 1 at night., Disp: , Rfl: ;  exenatide (BYETTA) 5 MCG/0.02ML SOPN, Inject 5 mcg into the skin 2 (two) times daily with a meal., Disp: , Rfl:  levothyroxine (SYNTHROID, LEVOTHROID) 150 MCG tablet, Take 150 mcg by mouth daily before breakfast., Disp: , Rfl: ;  nitroGLYCERIN (NITROSTAT) 0.4 MG SL tablet, Place 1 tablet (0.4 mg total) under the tongue every 5 (five) minutes as needed for chest pain (1 tablet every 5 minutes No more than 3 total per episode)., Disp: 25 tablet, Rfl: 3 nystatin-triamcinolone ointment (MYCOLOG), Apply 1 application topically 2 (two) times daily., Disp: 30 g, Rfl: 1;  sulfamethoxazole-trimethoprim (BACTRIM DS) 800-160 MG per tablet, Take 1 tablet by mouth 2 (two) times daily., Disp: 28 tablet, Rfl: 0  Review of Systems: Patient denies any headaches, blurred vision, shortness of breath, chest pain, abdominal pain, problems with bowel movements,  urination, or intercourse.No joint pain or mood changes.     Physical Exam:BP 122/56  Pulse 72  Ht 5' 2.5" (1.588 m)  Wt 147 lb 8 oz (66.906 kg)  BMI 26.53 kg/m2urine trace protein General:  Well developed, well nourished, no acute distress Skin:  Warm and dry Neck:  Midline trachea, normal thyroid, no carotid bruits heard Lungs; Clear to auscultation bilaterally Breast:  No dominant palpable mass, retraction, or nipple discharge Cardiovascular: Regular rate and rhythm Abdomen:  Soft, non tender, no hepatosplenomegaly, has skin fungus at old scar,  painted with gentian violet Pelvic:  External genitalia is normal in appearance.  The vagina is normal in appearance for age.               The cervix  And uterus are absent.  No adnexal masses or tenderness noted. Rectal: Good sphincter tone, no polyps,small interanl hemorrhoids felt.  Hemoccult negative. Extremities:  No swelling noted Psych:  No mood changes, alert and cooperative, seems happy, husband had shoulder surgery and is doing well   Impression: Yearly gyn exam no pap Recent UTIs Skin fungus     Plan: Rx mytrex ointment bid prn with 1 refill Physical in 2 years Mammogram yearly  Labs with PCP Colonoscopy per GI appt in am with urologist Call prn problems or concerns Review handout on skin fungus

## 2013-06-30 ENCOUNTER — Ambulatory Visit (INDEPENDENT_AMBULATORY_CARE_PROVIDER_SITE_OTHER): Payer: Medicare Other | Admitting: Urology

## 2013-06-30 DIAGNOSIS — N3020 Other chronic cystitis without hematuria: Secondary | ICD-10-CM

## 2013-06-30 DIAGNOSIS — N952 Postmenopausal atrophic vaginitis: Secondary | ICD-10-CM

## 2013-07-11 ENCOUNTER — Telehealth: Payer: Self-pay | Admitting: Cardiovascular Disease

## 2013-07-11 MED ORDER — AMLODIPINE BESYLATE 10 MG PO TABS: 10.0000 mg | ORAL_TABLET | Freq: Every day | ORAL | Status: DC

## 2013-07-11 NOTE — Telephone Encounter (Signed)
rx refilled.

## 2013-07-11 NOTE — Telephone Encounter (Signed)
Received fax refill request  Rx # [id number] Medication:  Amlodipine Besylate 10 mg tab Qty 90 Sig:  Take one tablet by mouth every day Physician:  Bronson Ing

## 2013-08-04 ENCOUNTER — Ambulatory Visit (INDEPENDENT_AMBULATORY_CARE_PROVIDER_SITE_OTHER): Payer: Medicare Other | Admitting: Cardiovascular Disease

## 2013-08-04 ENCOUNTER — Encounter: Payer: Self-pay | Admitting: Cardiovascular Disease

## 2013-08-04 VITALS — BP 137/56 | HR 77 | Ht 63.0 in | Wt 146.0 lb

## 2013-08-04 DIAGNOSIS — I10 Essential (primary) hypertension: Secondary | ICD-10-CM

## 2013-08-04 DIAGNOSIS — R002 Palpitations: Secondary | ICD-10-CM

## 2013-08-04 DIAGNOSIS — R0989 Other specified symptoms and signs involving the circulatory and respiratory systems: Secondary | ICD-10-CM

## 2013-08-04 DIAGNOSIS — I480 Paroxysmal atrial fibrillation: Secondary | ICD-10-CM

## 2013-08-04 DIAGNOSIS — R42 Dizziness and giddiness: Secondary | ICD-10-CM

## 2013-08-04 DIAGNOSIS — I2510 Atherosclerotic heart disease of native coronary artery without angina pectoris: Secondary | ICD-10-CM

## 2013-08-04 DIAGNOSIS — R0609 Other forms of dyspnea: Secondary | ICD-10-CM

## 2013-08-04 DIAGNOSIS — I252 Old myocardial infarction: Secondary | ICD-10-CM

## 2013-08-04 DIAGNOSIS — E785 Hyperlipidemia, unspecified: Secondary | ICD-10-CM

## 2013-08-04 DIAGNOSIS — I4891 Unspecified atrial fibrillation: Secondary | ICD-10-CM

## 2013-08-04 MED ORDER — ATORVASTATIN CALCIUM 20 MG PO TABS: 20.0000 mg | ORAL_TABLET | Freq: Every day | ORAL | Status: DC

## 2013-08-04 MED ORDER — ISOSORBIDE DINITRATE 10 MG PO TABS: 10.0000 mg | ORAL_TABLET | Freq: Three times a day (TID) | ORAL | Status: DC

## 2013-08-04 NOTE — Progress Notes (Signed)
Patient ID: Virginia Huber, female   DOB: 1946-03-29, 68 y.o.   MRN: [id number]      SUBJECTIVE: Virginia Huber returns today for the evaluation and management of coronary artery disease, one episode of paroxysmal atrial fibrillation without recurrence, hypertension, hyperlipidemia, and type 2 diabetes mellitus.  Her last Myoview was in 2013 which showed a possible area of minimal inferoapical wall ischemia. She has known OM2 disease and had an MI 18 months ago. She has been experiencing dyspnea with minimal exertion. She has had dizzy spells, but denies falls and syncope. She very seldom experiences chest tightness. She tries to walk frequently with her husband, who has a h/o CABG, and whom she is concerned about as she feels his health has declined recently. She does have palpitations, but denies orthopnea, leg swelling, and paroxysmal nocturnal dyspnea.     Allergies  Allergen Reactions  . Penicillins Hives  . Percocet [Oxycodone-Acetaminophen] Nausea And Vomiting    Current Outpatient Prescriptions  Medication Sig Dispense Refill  . amLODipine (NORVASC) 10 MG tablet Take 1 tablet (10 mg total) by mouth daily.  90 tablet  6  . aspirin 325 MG EC tablet Take 325 mg by mouth daily.        [name] atorvastatin (LIPITOR) 20 MG tablet Take 1 tablet (20 mg total) by mouth daily.  90 tablet  1  . Cholecalciferol (VITAMIN D) 2000 UNITS tablet Take 2,000 Units by mouth daily.        . insulin NPH-regular (NOVOLIN 70/30) (70-30) 100 UNIT/ML injection Inject 30 Units into the skin 2 (two) times daily with a meal.      . labetalol (NORMODYNE) 100 MG tablet Take 100 mg by mouth 2 (two) times daily.      [name] labetalol (NORMODYNE) 300 MG tablet Take 1 tablet (300 mg total) by mouth 2 (two) times daily. Dose increase  180 tablet  1  . levothyroxine (SYNTHROID, LEVOTHROID) 150 MCG tablet Take 150 mcg by mouth daily before breakfast.      . losartan (COZAAR) 100 MG tablet Take 1 tablet (100 mg total) by mouth  daily.  90 tablet  1  . metFORMIN (GLUCOPHAGE) 500 MG tablet Take 500-1,000 mg by mouth 2 (two) times daily with a meal. Takes 2 tablets in the morning and 1 at night.      . nystatin-triamcinolone ointment (MYCOLOG) Apply 1 application topically 2 (two) times daily.  30 g  1  . nitroGLYCERIN (NITROSTAT) 0.4 MG SL tablet Place 1 tablet (0.4 mg total) under the tongue every 5 (five) minutes as needed for chest pain (1 tablet every 5 minutes No more than 3 total per episode).  25 tablet  3   No current facility-administered medications for this visit.    Past Medical History  Diagnosis Date  . HTN (hypertension)   . DM (diabetes mellitus)   . Heart block   . Hypothyroidism   . Ovarian tumor   . Lupus   . S/P colonoscopy 2005    Dr. Laural Golden: pancolonic divericula, polyp, path unknown currently  . Coronary artery disease   . UTI (urinary tract infection) 05/08/2013  . Superficial fungus infection of skin 06/29/2013    Past Surgical History  Procedure Laterality Date  . Cholecystectomy    . Colostomy      then reversed  . Partial hysterectomy      left ovaries, then ovaries removed later due tumors   . Oophorectomy      1980, nicked bowel,  peritonitis, colostomy, now colostomy reversed   . Eye surgeries    . Cardiac catheterization  2008    Tiny OM-2 with 90% narrowing. Med tx.  . Nuclear med stress test  10/2011    Small area of mild ischemia inferoapically.    History   Social History  . Marital Status: Married    Spouse Name: N/A    Number of Children: N/A  . Years of Education: N/A   Occupational History  . Retired     Research officer, political party   Social History Main Topics  . Smoking status: Never Smoker   . Smokeless tobacco: Never Used  . Alcohol Use: No  . Drug Use: No  . Sexual Activity: Not Currently    Birth Control/ Protection: Surgical   Other Topics Concern  . Not on file   Social History Narrative  . No narrative on file     Filed Vitals:   08/04/13  1011  BP: 137/56  Pulse: 77  Height: 5\' 3"  (1.6 m)  Weight: 146 lb (66.225 kg)    PHYSICAL EXAM General: NAD Neck: No JVD, no thyromegaly or thyroid nodule.  Lungs: Clear to auscultation bilaterally with normal respiratory effort. CV: Nondisplaced PMI.  Heart regular S1/S2, no S3/S4, no murmur.  No peripheral edema.  No carotid bruit.  Normal pedal pulses.  Abdomen: Soft, nontender, no hepatosplenomegaly, no distention.  Neurologic: Alert and oriented x 3.  Psych: Normal affect. Extremities: No clubbing or cyanosis.   ECG: reviewed and available in electronic records.      ASSESSMENT AND PLAN: 1. Exertional dyspnea and palpitations with known CAD and h/o MI: given that her symptoms have progressed in frequency and severity, I will start isosorbide dinitrate 10 mg tid. I will also obtain an echocardiogram to evaluate systolic and diastolic function, as well as to assess regional wall motion. I will obtain a Lexiscan Cardiolite to evaluate for inducible ischemia as a potential etiology of her symptoms. 2. HTN: controlled on present therapy. 3. Hyperlipidemia: she tells me her lipids were checked 2 months ago by her PCP. I will try and obtain these results. She says her triglycerides were elevated, and she was started on fish oil.  Dispo: f/u 1 month.  Kate Sable, M.D., F.A.C.C.

## 2013-08-04 NOTE — Patient Instructions (Addendum)
Your physician recommends that you schedule a follow-up appointment in: 1 month   Your physician has recommended you make the following change in your medication:    Start Isosorbide dinitrate 10 mg three times a day   Your physician has requested that you have an echocardiogram. Echocardiography is a painless test that uses sound waves to create images of your heart. It provides your doctor with information about the size and shape of your heart and how well your heart's chambers and valves are working. This procedure takes approximately one hour. There are no restrictions for this procedure.  Your physician has requested that you have a lexiscan myoview. For further information please visit [email or web address]. Please follow instruction sheet, as given.

## 2013-08-11 ENCOUNTER — Ambulatory Visit (HOSPITAL_COMMUNITY)
Admission: RE | Admit: 2013-08-11 | Discharge: 2013-08-11 | Disposition: A | Discharge status: Home / Self Care | Payer: Medicare Other | Source: Ambulatory Visit | Attending: Cardiovascular Disease | Admitting: Cardiovascular Disease

## 2013-08-11 DIAGNOSIS — E119 Type 2 diabetes mellitus without complications: Secondary | ICD-10-CM | POA: Insufficient documentation

## 2013-08-11 DIAGNOSIS — R0609 Other forms of dyspnea: Secondary | ICD-10-CM | POA: Insufficient documentation

## 2013-08-11 DIAGNOSIS — I517 Cardiomegaly: Secondary | ICD-10-CM

## 2013-08-11 DIAGNOSIS — I4891 Unspecified atrial fibrillation: Secondary | ICD-10-CM | POA: Insufficient documentation

## 2013-08-11 DIAGNOSIS — R0989 Other specified symptoms and signs involving the circulatory and respiratory systems: Principal | ICD-10-CM | POA: Insufficient documentation

## 2013-08-11 DIAGNOSIS — I2510 Atherosclerotic heart disease of native coronary artery without angina pectoris: Secondary | ICD-10-CM | POA: Insufficient documentation

## 2013-08-11 DIAGNOSIS — R42 Dizziness and giddiness: Secondary | ICD-10-CM | POA: Insufficient documentation

## 2013-08-11 DIAGNOSIS — E785 Hyperlipidemia, unspecified: Secondary | ICD-10-CM | POA: Insufficient documentation

## 2013-08-11 DIAGNOSIS — I10 Essential (primary) hypertension: Secondary | ICD-10-CM | POA: Insufficient documentation

## 2013-08-11 DIAGNOSIS — R002 Palpitations: Secondary | ICD-10-CM | POA: Insufficient documentation

## 2013-08-11 NOTE — Progress Notes (Signed)
*  PRELIMINARY RESULTS* Echocardiogram 2D Echocardiogram has been performed.  Virginia Huber, [place] 08/11/2013, 12:22 PM

## 2013-08-17 ENCOUNTER — Encounter (HOSPITAL_COMMUNITY): Payer: Self-pay

## 2013-08-17 ENCOUNTER — Encounter (HOSPITAL_COMMUNITY)
Admission: RE | Admit: 2013-08-17 | Discharge: 2013-08-17 | Disposition: A | Discharge status: Home / Self Care | Payer: Medicare Other | Source: Ambulatory Visit | Attending: Cardiovascular Disease | Admitting: Cardiovascular Disease

## 2013-08-17 DIAGNOSIS — R0602 Shortness of breath: Secondary | ICD-10-CM

## 2013-08-17 DIAGNOSIS — R002 Palpitations: Secondary | ICD-10-CM

## 2013-08-17 DIAGNOSIS — I2510 Atherosclerotic heart disease of native coronary artery without angina pectoris: Secondary | ICD-10-CM

## 2013-08-17 DIAGNOSIS — R0609 Other forms of dyspnea: Principal | ICD-10-CM

## 2013-08-17 DIAGNOSIS — R0989 Other specified symptoms and signs involving the circulatory and respiratory systems: Principal | ICD-10-CM | POA: Insufficient documentation

## 2013-08-17 MED ORDER — SODIUM CHLORIDE 0.9 % IJ SOLN
INTRAMUSCULAR | Status: AC
  Administered 2013-08-17: 10 mL via INTRAVENOUS
  Filled 2013-08-17: qty 10

## 2013-08-17 MED ORDER — REGADENOSON 0.4 MG/5ML IV SOLN
INTRAVENOUS | Status: AC
  Administered 2013-08-17: 0.4 mg via INTRAVENOUS
  Filled 2013-08-17: qty 5

## 2013-08-17 MED ORDER — TECHNETIUM TC 99M SESTAMIBI - CARDIOLITE
10.0000 | Freq: Once | INTRAVENOUS | Status: AC | PRN
  Administered 2013-08-17: 08:00:00 10 via INTRAVENOUS

## 2013-08-17 MED ORDER — TECHNETIUM TC 99M SESTAMIBI GENERIC - CARDIOLITE
30.0000 | Freq: Once | INTRAVENOUS | Status: AC | PRN
  Administered 2013-08-17: 30 via INTRAVENOUS

## 2013-08-17 NOTE — Progress Notes (Signed)
Stress Lab Nurses Notes - Virginia Huber  Virginia Huber 08/17/2013 Reason for doing test: Palpitation, dizziness & dyspnea Type of test: Wille Glaser Nurse performing test: Gerrit Halls, RN Nuclear Medicine Tech: Melburn Hake Echo Tech: Not Applicable MD performing test: Koneswaran/K.Lawrence NP Family MD: [name] Test explained and consent signed: yes IV started: 22g jelco, Saline lock flushed, No redness or edema and Saline lock started in radiology Symptoms: chest tightness Treatment/Intervention: None Reason test stopped: protocol completed After recovery IV was: Discontinued via X-ray tech and No redness or edema Patient to return to [place]. Med at : 10:50 Patient discharged: Home Patient's Condition upon discharge was: stable Comments: During test BP 170/56 & HR 87.  Recovery BP 159/64 & HR 78.  Symptoms resolved in recovery. [name] T

## 2013-08-24 ENCOUNTER — Other Ambulatory Visit: Payer: Self-pay | Admitting: Cardiology

## 2013-08-25 ENCOUNTER — Ambulatory Visit (INDEPENDENT_AMBULATORY_CARE_PROVIDER_SITE_OTHER): Payer: Medicare Other | Admitting: Urology

## 2013-08-25 DIAGNOSIS — N952 Postmenopausal atrophic vaginitis: Secondary | ICD-10-CM

## 2013-08-25 DIAGNOSIS — N3020 Other chronic cystitis without hematuria: Secondary | ICD-10-CM

## 2013-09-01 ENCOUNTER — Ambulatory Visit (INDEPENDENT_AMBULATORY_CARE_PROVIDER_SITE_OTHER): Payer: Medicare Other | Admitting: Cardiovascular Disease

## 2013-09-01 ENCOUNTER — Encounter: Payer: Self-pay | Admitting: Cardiovascular Disease

## 2013-09-01 VITALS — BP 114/53 | HR 59 | Ht 63.0 in | Wt 146.0 lb

## 2013-09-01 DIAGNOSIS — I4891 Unspecified atrial fibrillation: Secondary | ICD-10-CM

## 2013-09-01 DIAGNOSIS — R42 Dizziness and giddiness: Secondary | ICD-10-CM

## 2013-09-01 DIAGNOSIS — I10 Essential (primary) hypertension: Secondary | ICD-10-CM

## 2013-09-01 DIAGNOSIS — R0989 Other specified symptoms and signs involving the circulatory and respiratory systems: Secondary | ICD-10-CM

## 2013-09-01 DIAGNOSIS — I480 Paroxysmal atrial fibrillation: Secondary | ICD-10-CM

## 2013-09-01 DIAGNOSIS — I2510 Atherosclerotic heart disease of native coronary artery without angina pectoris: Secondary | ICD-10-CM

## 2013-09-01 DIAGNOSIS — I252 Old myocardial infarction: Secondary | ICD-10-CM

## 2013-09-01 DIAGNOSIS — E785 Hyperlipidemia, unspecified: Secondary | ICD-10-CM

## 2013-09-01 DIAGNOSIS — R0609 Other forms of dyspnea: Secondary | ICD-10-CM

## 2013-09-01 NOTE — Patient Instructions (Addendum)
Your physician wants you to follow-up in:  3 months   Your physician has recommended you make the following change in your medication:     1. STOP Labetolol 100mg  twice a day   2.You have been given a blood pressure monitoring sheet to track your BP and heart rate   Thank you for choosing [facility] !

## 2013-09-01 NOTE — Progress Notes (Signed)
Patient ID: Virginia Huber, female   DOB: [birth year], 68 y.o.   MRN: [id number]      SUBJECTIVE: The patient is here to followup on the results of cardiovascular testing, performed for the evaluation of dyspnea on exertion. Her echocardiogram showed normal left ventricular systolic function, EF 123456, severe LVH, and grade 2 diastolic dysfunction. Her nuclear MPI study revealed normal myocardial perfusion. I started her on isosorbide dinitrate at her last visit. She has been checking her BP and pulse at home, and her HR is in the high 50 bpm range and her BP is in the 110/50 range. She has been feeling dizzy but denies syncope.    Allergies  Allergen Reactions  . Penicillins Hives  . Percocet [Oxycodone-Acetaminophen] Nausea And Vomiting    Current Outpatient Prescriptions  Medication Sig Dispense Refill  . amLODipine (NORVASC) 10 MG tablet Take 1 tablet (10 mg total) by mouth daily.  90 tablet  6  . aspirin 325 MG EC tablet Take 325 mg by mouth daily.        [name] atorvastatin (LIPITOR) 20 MG tablet Take 1 tablet (20 mg total) by mouth daily.  90 tablet  3  . Cholecalciferol (VITAMIN D) 2000 UNITS tablet Take 2,000 Units by mouth daily.        . ciprofloxacin (CIPRO) 250 MG tablet       . insulin NPH-regular (NOVOLIN 70/30) (70-30) 100 UNIT/ML injection Inject 30 Units into the skin 2 (two) times daily with a meal.      . isosorbide dinitrate (ISORDIL) 10 MG tablet Take 1 tablet (10 mg total) by mouth 3 (three) times daily.  90 tablet  6  . labetalol (NORMODYNE) 100 MG tablet Take 100 mg by mouth 2 (two) times daily.      [name] labetalol (NORMODYNE) 300 MG tablet Take 1 tablet (300 mg total) by mouth 2 (two) times daily. Dose increase  180 tablet  1  . levothyroxine (SYNTHROID, LEVOTHROID) 150 MCG tablet Take 150 mcg by mouth daily before breakfast.      . losartan (COZAAR) 100 MG tablet TAKE ONE (1) TABLET BY MOUTH EVERY DAY  90 tablet  2  . metFORMIN (GLUCOPHAGE) 500 MG tablet Take 500-1,000  mg by mouth 2 (two) times daily with a meal. Takes 2 tablets in the morning and 1 at night.      . nystatin-triamcinolone ointment (MYCOLOG) Apply 1 application topically 2 (two) times daily.  30 g  1  . Omega-3 Fatty Acids (FISH OIL) 1000 MG CAPS Take by mouth.      . nitroGLYCERIN (NITROSTAT) 0.4 MG SL tablet Place 1 tablet (0.4 mg total) under the tongue every 5 (five) minutes as needed for chest pain (1 tablet every 5 minutes No more than 3 total per episode).  25 tablet  3   No current facility-administered medications for this visit.    Past Medical History  Diagnosis Date  . HTN (hypertension)   . DM (diabetes mellitus)   . Heart block   . Hypothyroidism   . Ovarian tumor   . Lupus   . S/P colonoscopy 2005    Dr. Laural Golden: pancolonic divericula, polyp, path unknown currently  . Coronary artery disease   . UTI (urinary tract infection) 05/08/2013  . Superficial fungus infection of skin 06/29/2013    Past Surgical History  Procedure Laterality Date  . Cholecystectomy    . Colostomy      then reversed  . Partial hysterectomy  left ovaries, then ovaries removed later due tumors   . Oophorectomy      1980, nicked bowel, peritonitis, colostomy, now colostomy reversed   . Eye surgeries    . Cardiac catheterization  2008    Tiny OM-2 with 90% narrowing. Med tx.  . Nuclear med stress test  10/2011    Small area of mild ischemia inferoapically.    History   Social History  . Marital Status: Married    Spouse Name: N/A    Number of Children: N/A  . Years of Education: N/A   Occupational History  . Retired     Research officer, political party   Social History Main Topics  . Smoking status: Never Smoker   . Smokeless tobacco: Never Used  . Alcohol Use: No  . Drug Use: No  . Sexual Activity: Not Currently    Birth Control/ Protection: Surgical   Other Topics Concern  . Not on file   Social History Narrative  . No narrative on file     Filed Vitals:   09/01/13 0840  BP:  114/53  Pulse: 59  Height: 5\' 3"  (1.6 m)  Weight: 146 lb (66.225 kg)    PHYSICAL EXAM General: NAD Neck: No JVD, no thyromegaly. Lungs: Clear to auscultation bilaterally with normal respiratory effort. CV: Nondisplaced PMI.  Regular rate and rhythm, normal S1/S2, no S3/S4, no murmur. No pretibial or periankle edema.  No carotid bruit.  Normal pedal pulses.  Abdomen: Soft, nontender, no hepatosplenomegaly, no distention.  Neurologic: Alert and oriented x 3.  Psych: Normal affect. Extremities: No clubbing or cyanosis.   ECG: reviewed and available in electronic records.  Lexiscan: IMPRESSION: 1. Nonspecific ST-T abnormalities seen, with non-limiting chest pain reported.  2. Normal myocardial perfusion, with normal regional wall motion.  3. No evidence of ischemia or scar.  4. Normal left ventricular systolic function, LVEF 123456.   Echo: - Study data: Technically adequate study. - Left ventricle: The cavity size was normal. Wall thickness was increased in a pattern of severe LVH. Systolic function was normal. The estimated ejection fraction was in the range of 60% to 65%. Wall motion was normal; there were no regional wall motion abnormalities. Features are consistent with a pseudonormal left ventricular filling pattern, with concomitant abnormal relaxation and increased filling pressure (grade 2 diastolic dysfunction). - Aortic valve: Valve area: 2cm^2(VTI). Valve area: 1.82cm^2 (Vmax). - Left atrium: The atrium was moderately dilated.    ASSESSMENT AND PLAN: 1. Exertional dyspnea and palpitations with known CAD and h/o MI: I will continue isosorbide dinitrate 10 mg tid. No ischemia on nuclear MPI. 2. HTN: controlled on present therapy. However, given her symptoms of dizziness with relative hypotension, I will reduce labetalol from 400 mg bid to 300 mg bid. I've asked her to monitor her BP and HR over the next month, to see if further titration is warranted. 3.  Hyperlipidemia: her lipids were checked 2 months ago by her PCP. I will try and obtain these results. She says her triglycerides were elevated, and she was started on fish oil.   Dispo: f/u 3 months.     Kate Sable, M.D., F.A.C.C.

## 2013-09-11 ENCOUNTER — Ambulatory Visit: Payer: Medicare Other | Admitting: Cardiovascular Disease

## 2013-10-04 ENCOUNTER — Telehealth: Payer: Self-pay

## 2013-10-04 NOTE — Telephone Encounter (Signed)
Spoke with husband,relayed that BP tracking sheet show BP was primarily controlled per Dr.Koneswaran ,no changes in medical therapy

## 2013-10-31 ENCOUNTER — Telehealth: Payer: Self-pay | Admitting: *Deleted

## 2013-10-31 ENCOUNTER — Other Ambulatory Visit: Payer: Self-pay

## 2013-10-31 MED ORDER — LABETALOL HCL 300 MG PO TABS: 300.0000 mg | ORAL_TABLET | Freq: Two times a day (BID) | ORAL | Status: DC

## 2013-10-31 NOTE — Telephone Encounter (Signed)
Pharmacy states that they have been trying since last week to get   labetalol (NORMODYNE) 300 MG tablet filled

## 2013-11-03 ENCOUNTER — Ambulatory Visit (INDEPENDENT_AMBULATORY_CARE_PROVIDER_SITE_OTHER): Payer: Medicare Other | Admitting: Urology

## 2013-11-03 DIAGNOSIS — N952 Postmenopausal atrophic vaginitis: Secondary | ICD-10-CM

## 2013-11-03 DIAGNOSIS — N3020 Other chronic cystitis without hematuria: Secondary | ICD-10-CM

## 2013-11-20 ENCOUNTER — Encounter: Payer: Self-pay | Admitting: Cardiovascular Disease

## 2013-11-20 ENCOUNTER — Ambulatory Visit (INDEPENDENT_AMBULATORY_CARE_PROVIDER_SITE_OTHER): Payer: Medicare Other | Admitting: Cardiovascular Disease

## 2013-11-20 VITALS — BP 126/60 | HR 64 | Ht 63.0 in | Wt 148.0 lb

## 2013-11-20 DIAGNOSIS — E1165 Type 2 diabetes mellitus with hyperglycemia: Secondary | ICD-10-CM

## 2013-11-20 DIAGNOSIS — IMO0002 Reserved for concepts with insufficient information to code with codable children: Secondary | ICD-10-CM

## 2013-11-20 DIAGNOSIS — I10 Essential (primary) hypertension: Secondary | ICD-10-CM

## 2013-11-20 DIAGNOSIS — R0609 Other forms of dyspnea: Secondary | ICD-10-CM

## 2013-11-20 DIAGNOSIS — I2510 Atherosclerotic heart disease of native coronary artery without angina pectoris: Secondary | ICD-10-CM

## 2013-11-20 DIAGNOSIS — E785 Hyperlipidemia, unspecified: Secondary | ICD-10-CM

## 2013-11-20 DIAGNOSIS — R0989 Other specified symptoms and signs involving the circulatory and respiratory systems: Secondary | ICD-10-CM

## 2013-11-20 DIAGNOSIS — IMO0001 Reserved for inherently not codable concepts without codable children: Secondary | ICD-10-CM

## 2013-11-20 DIAGNOSIS — G4730 Sleep apnea, unspecified: Secondary | ICD-10-CM

## 2013-11-20 DIAGNOSIS — R252 Cramp and spasm: Secondary | ICD-10-CM

## 2013-11-20 NOTE — Progress Notes (Signed)
Patient ID: Virginia Huber, female   DOB: 08-Sep-1945, 68 y.o.   MRN: [id number]      SUBJECTIVE: The patient is here to followup for coronary artery disease, hypertension, and hyperlipidemia. Prior echocardiogram demonstrated normal left ventricular systolic function, EF 123456, severe LVH, and grade 2 diastolic dysfunction.  Labs performed on 09/26/2013 demonstrate GFR 61 mL per minute, HbA1c 8.6%, LDL 64, HDL 25, total cholesterol 133, triglycerides 222.  She denies chest pain and exertional dyspnea. She has been having cramping in her quadriceps, hamstrings, calves, and feet. Her blood pressure has been well controlled at home. She does feel fatigued throughout the day and her husband attests to the fact that she snores a lot and she has had witnessed apneic episodes.  She and her husband just celebrated their 13th wedding anniversary and are planning to go on an [place] cruise.   Allergies  Allergen Reactions  . Penicillins Hives  . Percocet [Oxycodone-Acetaminophen] Nausea And Vomiting    Current Outpatient Prescriptions  Medication Sig Dispense Refill  . amLODipine (NORVASC) 10 MG tablet Take 1 tablet (10 mg total) by mouth daily.  90 tablet  6  . aspirin 325 MG EC tablet Take 325 mg by mouth daily.        [name] atorvastatin (LIPITOR) 20 MG tablet Take 1 tablet (20 mg total) by mouth daily.  90 tablet  3  . Cholecalciferol (VITAMIN D) 2000 UNITS tablet Take 2,000 Units by mouth daily.        . ciprofloxacin (CIPRO) 250 MG tablet       . insulin NPH-regular (NOVOLIN 70/30) (70-30) 100 UNIT/ML injection Inject 30 Units into the skin 2 (two) times daily with a meal.      . isosorbide dinitrate (ISORDIL) 10 MG tablet Take 1 tablet (10 mg total) by mouth 3 (three) times daily.  90 tablet  6  . labetalol (NORMODYNE) 300 MG tablet Take 1 tablet (300 mg total) by mouth 2 (two) times daily. Dose increase  180 tablet  1  . levothyroxine (SYNTHROID, LEVOTHROID) 150 MCG tablet Take 150 mcg by mouth  daily before breakfast.      . losartan (COZAAR) 100 MG tablet TAKE ONE (1) TABLET BY MOUTH EVERY DAY  90 tablet  2  . metFORMIN (GLUCOPHAGE) 500 MG tablet Take 500-1,000 mg by mouth 2 (two) times daily with a meal. Takes 2 tablets in the morning and 1 at night.      . nitroGLYCERIN (NITROSTAT) 0.4 MG SL tablet Place 1 tablet (0.4 mg total) under the tongue every 5 (five) minutes as needed for chest pain (1 tablet every 5 minutes No more than 3 total per episode).  25 tablet  3  . nystatin-triamcinolone ointment (MYCOLOG) Apply 1 application topically 2 (two) times daily.  30 g  1  . Omega-3 Fatty Acids (FISH OIL) 1000 MG CAPS Take by mouth.       No current facility-administered medications for this visit.    Past Medical History  Diagnosis Date  . HTN (hypertension)   . DM (diabetes mellitus)   . Heart block   . Hypothyroidism   . Ovarian tumor   . Lupus   . S/P colonoscopy 2005    Dr. Laural Golden: pancolonic divericula, polyp, path unknown currently  . Coronary artery disease   . UTI (urinary tract infection) 05/08/2013  . Superficial fungus infection of skin 06/29/2013    Past Surgical History  Procedure Laterality Date  . Cholecystectomy    .  Colostomy      then reversed  . Partial hysterectomy      left ovaries, then ovaries removed later due tumors   . Oophorectomy      1980, nicked bowel, peritonitis, colostomy, now colostomy reversed   . Eye surgeries    . Cardiac catheterization  2008    Tiny OM-2 with 90% narrowing. Med tx.  . Nuclear med stress test  10/2011    Small area of mild ischemia inferoapically.    History   Social History  . Marital Status: Married    Spouse Name: N/A    Number of Children: N/A  . Years of Education: N/A   Occupational History  . Retired     Research officer, political party   Social History Main Topics  . Smoking status: Never Smoker   . Smokeless tobacco: Never Used  . Alcohol Use: No  . Drug Use: No  . Sexual Activity: Not Currently     Birth Control/ Protection: Surgical   Other Topics Concern  . Not on file   Social History Narrative  . No narrative on file     Filed Vitals:   11/20/13 0849  Height: 5\' 3"  (1.6 m)  Weight: 148 lb (67.132 kg)   BP 126/60  Pulse 64   PHYSICAL EXAM General: NAD Neck: No JVD, no thyromegaly. Lungs: Clear to auscultation bilaterally with normal respiratory effort. CV: Nondisplaced PMI.  Regular rate and rhythm, normal S1/S2, no S3/S4, no murmur. No pretibial or periankle edema.  No carotid bruit. Diminished pedal pulses.  Abdomen: Soft, nontender, no hepatosplenomegaly, no distention.  Neurologic: Alert and oriented x 3.  Psych: Normal affect. Extremities: No clubbing or cyanosis.   ECG: reviewed and available in electronic records.      ASSESSMENT AND PLAN: 1. Exertional dyspnea and palpitations with known CAD and h/o MI: Symptomatically stable. I will continue isosorbide dinitrate 10 mg tid. No ischemia on prior nuclear MPI.  2. HTN: Controlled on present therapy. Continue labetalol 300 mg bid. 3. Hyperlipidemia: Lipid results as noted above. Continue Lipitor 20 mg daily and fish oil. 4. Daytime somnolence and fatigue: She appears to have fairly suggestive evidence of sleep apnea. I will schedule a sleep study. 5. Lower extremity cramping: Given her history of coronary artery disease and insulin-dependent diabetes mellitus, she has clear risk factors for peripheral vascular disease. I will order lower extremity ABIs.  Dispo: f/u 6 months.     Kate Sable, M.D., F.A.C.C.

## 2013-11-20 NOTE — Patient Instructions (Addendum)
Your physician wants you to follow-up in: 6 MONTHS with Dr.Koneswaran  You will receive a reminder letter in the mail two months in advance. If you don't receive a letter, please call our office to schedule the follow-up appointment.   Your physician recommends that you continue on your current medications as directed. Please refer to the Current Medication list given to you today.    You have been referred for a sleep study done here at [facility] has requested that you have an ankle brachial index (ABI).Schedule at your convenience   During this test an ultrasound and blood pressure cuff are used to evaluate the arteries that supply the arms and legs with blood. Allow thirty minutes for this exam. There are no restrictions or special instructions.     Thank you for choosing [facility] !

## 2013-11-22 ENCOUNTER — Ambulatory Visit (HOSPITAL_COMMUNITY)
Admission: RE | Admit: 2013-11-22 | Discharge: 2013-11-22 | Disposition: A | Discharge status: Home / Self Care | Payer: Medicare Other | Source: Ambulatory Visit | Attending: Cardiovascular Disease | Admitting: Cardiovascular Disease

## 2013-11-22 DIAGNOSIS — I739 Peripheral vascular disease, unspecified: Secondary | ICD-10-CM | POA: Insufficient documentation

## 2013-11-22 DIAGNOSIS — I10 Essential (primary) hypertension: Secondary | ICD-10-CM | POA: Insufficient documentation

## 2013-11-22 DIAGNOSIS — I2510 Atherosclerotic heart disease of native coronary artery without angina pectoris: Secondary | ICD-10-CM

## 2013-11-22 DIAGNOSIS — R252 Cramp and spasm: Secondary | ICD-10-CM

## 2013-11-22 DIAGNOSIS — E785 Hyperlipidemia, unspecified: Secondary | ICD-10-CM | POA: Insufficient documentation

## 2013-11-22 DIAGNOSIS — E119 Type 2 diabetes mellitus without complications: Secondary | ICD-10-CM | POA: Insufficient documentation

## 2013-11-22 DIAGNOSIS — R0989 Other specified symptoms and signs involving the circulatory and respiratory systems: Secondary | ICD-10-CM

## 2013-11-23 ENCOUNTER — Telehealth: Payer: Self-pay

## 2013-11-23 NOTE — Telephone Encounter (Signed)
Please make apt for pt with Dr. Fletcher Anon for consult for PVD at [address]  Per [place] notify pt-   ty

## 2013-11-23 NOTE — Telephone Encounter (Signed)
Message copied by Bernita Raisin on Thu Nov 23, 2013 10:25 AM ------      Message from: Kate Sable A      Created: Thu Nov 23, 2013  9:17 AM       Please schedule an appt with Dr. Fletcher Anon for peripheral vascular disease. ------

## 2013-11-23 NOTE — Telephone Encounter (Signed)
Message copied by Bernita Raisin on Thu Nov 23, 2013 10:13 AM ------      Message from: Kate Sable A      Created: Thu Nov 23, 2013  9:17 AM       Please schedule an appt with Dr. Fletcher Anon for peripheral vascular disease. ------

## 2013-12-26 ENCOUNTER — Encounter: Payer: Self-pay | Admitting: Cardiovascular Disease

## 2013-12-26 ENCOUNTER — Ambulatory Visit (INDEPENDENT_AMBULATORY_CARE_PROVIDER_SITE_OTHER): Payer: Medicare Other | Admitting: Cardiovascular Disease

## 2013-12-26 ENCOUNTER — Encounter: Payer: Self-pay | Admitting: *Deleted

## 2013-12-26 VITALS — BP 126/64 | HR 58 | Ht 63.0 in | Wt 151.0 lb

## 2013-12-26 DIAGNOSIS — I739 Peripheral vascular disease, unspecified: Secondary | ICD-10-CM

## 2013-12-26 LAB — CBC WITH DIFFERENTIAL/PLATELET
Basophils Absolute: 0.1 10*3/uL (ref 0.0–0.1)
Basophils Relative: 0.6 % (ref 0.0–3.0)
EOS PCT: 2.5 % (ref 0.0–5.0)
Eosinophils Absolute: 0.2 10*3/uL (ref 0.0–0.7)
HEMATOCRIT: 36.4 % (ref 36.0–46.0)
HEMOGLOBIN: 12.1 g/dL (ref 12.0–15.0)
LYMPHS ABS: 1.4 10*3/uL (ref 0.7–4.0)
Lymphocytes Relative: 16.5 % (ref 12.0–46.0)
MCHC: 33.2 g/dL (ref 30.0–36.0)
MCV: 90.9 fl (ref 78.0–100.0)
Monocytes Absolute: 0.6 10*3/uL (ref 0.1–1.0)
Monocytes Relative: 7.2 % (ref 3.0–12.0)
NEUTROS ABS: 6.4 10*3/uL (ref 1.4–7.7)
Neutrophils Relative %: 73.2 % (ref 43.0–77.0)
Platelets: 246 10*3/uL (ref 150.0–400.0)
RBC: 4.01 Mil/uL (ref 3.87–5.11)
RDW: 14.8 % (ref 11.5–15.5)
WBC: 8.7 10*3/uL (ref 4.0–10.5)

## 2013-12-26 LAB — BASIC METABOLIC PANEL
BUN: 20 mg/dL (ref 6–23)
CALCIUM: 9.5 mg/dL (ref 8.4–10.5)
CO2: 26 meq/L (ref 19–32)
CREATININE: 1 mg/dL (ref 0.4–1.2)
Chloride: 105 mEq/L (ref 96–112)
GFR: 56.55 mL/min — ABNORMAL LOW (ref >60.00)
GLUCOSE: 194 mg/dL — AB (ref 70–99)
Potassium: 4.2 mEq/L (ref 3.5–5.1)
Sodium: 138 mEq/L (ref 135–145)

## 2013-12-26 LAB — PROTIME-INR
INR: 0.9 ratio (ref 0.8–1.0)
Prothrombin Time: 10.4 s (ref 9.6–13.1)

## 2013-12-26 NOTE — Patient Instructions (Addendum)
Your physician has requested that you have a Abdominal aorta angiogram. Lower extremity w/ possible angioplasty. This exam is performed at the [facility] hospital Please review the information sheet given for details.01/03/14 AT 8:30 AM    Your physician recommends that you have lab work today: [place] recommends that you continue on your current medications as directed. Please refer to the Current Medication list given to you today.

## 2013-12-26 NOTE — Progress Notes (Signed)
Primary cardiologist: Dr. Bronson Ing  HPI  This is a pleasant 68 year old who was referred for evaluation and management of PAD. She has known history of coronary artery disease, diabetes,  hypertension, and hyperlipidemia. Prior echocardiogram demonstrated normal left ventricular systolic function, EF 123456, severe LVH, and grade 2 diastolic dysfunction.   She denies chest pain and exertional dyspnea. She has been having cramping in her quadriceps, hamstrings, calves, and feet mostly affecting the left leg. This started few years ago but was initially mild. It became significantly worse over last few months. She and her husband just celebrated their 50th wedding anniversary and went on an [place] cruise. She felt very limited during the trip due to left leg claudication. The discomfort is happening after walking 80 feet and forces her to stop and rest for few minutes before she can resume. She also has severe left leg cramps at night.     Allergies  Allergen Reactions  . Penicillins Hives  . Percocet [Oxycodone-Acetaminophen] Nausea And Vomiting     Current Outpatient Prescriptions on File Prior to Visit  Medication Sig Dispense Refill  . amLODipine (NORVASC) 10 MG tablet Take 1 tablet (10 mg total) by mouth daily.  90 tablet  6  . aspirin 325 MG EC tablet Take 325 mg by mouth daily.        [name] atorvastatin (LIPITOR) 20 MG tablet Take 1 tablet (20 mg total) by mouth daily.  90 tablet  3  . Cholecalciferol (VITAMIN D) 2000 UNITS tablet Take 2,000 Units by mouth daily.        [name] glimepiride (AMARYL) 2 MG tablet       . insulin NPH-regular (NOVOLIN 70/30) (70-30) 100 UNIT/ML injection Inject 30 Units into the skin 2 (two) times daily with a meal.      . isosorbide dinitrate (ISORDIL) 10 MG tablet Take 1 tablet (10 mg total) by mouth 3 (three) times daily.  90 tablet  6  . labetalol (NORMODYNE) 300 MG tablet Take 1 tablet (300 mg total) by mouth 2 (two) times daily. Dose increase  180 tablet  1    . losartan (COZAAR) 100 MG tablet TAKE ONE (1) TABLET BY MOUTH EVERY DAY  90 tablet  2  . metFORMIN (GLUCOPHAGE) 500 MG tablet Takes 2 tablets in the morning and 1 at night.      . nitroGLYCERIN (NITROSTAT) 0.4 MG SL tablet Place 1 tablet (0.4 mg total) under the tongue every 5 (five) minutes as needed for chest pain (1 tablet every 5 minutes No more than 3 total per episode).  25 tablet  3  . Omega-3 Fatty Acids (FISH OIL) 1000 MG CAPS Take by mouth.       No current facility-administered medications on file prior to visit.     Past Medical History  Diagnosis Date  . HTN (hypertension)   . DM (diabetes mellitus)   . Heart block   . Hypothyroidism   . Ovarian tumor   . Lupus   . S/P colonoscopy 2005    Dr. Laural Golden: pancolonic divericula, polyp, path unknown currently  . Coronary artery disease   . UTI (urinary tract infection) 05/08/2013  . Superficial fungus infection of skin 06/29/2013     Past Surgical History  Procedure Laterality Date  . Cholecystectomy    . Colostomy      then reversed  . Partial hysterectomy      left ovaries, then ovaries removed later due tumors   . Oophorectomy  1980, nicked bowel, peritonitis, colostomy, now colostomy reversed   . Eye surgeries    . Cardiac catheterization  2008    Tiny OM-2 with 90% narrowing. Med tx.  . Nuclear med stress test  10/2011    Small area of mild ischemia inferoapically.     Family History  Problem Relation Age of Onset  . Heart disease Mother     deceased  . Heart disease Father     deceased, heart disease  . Colon cancer Neg Hx   . Diabetes Brother   . Heart disease Brother   . Thyroid disease Brother   . Heart disease Sister   . Heart disease Brother   . Thyroid disease Brother      History   Social History  . Marital Status: Married    Spouse Name: N/A    Number of Children: N/A  . Years of Education: N/A   Occupational History  . Retired     Research officer, political party   Social History Main  Topics  . Smoking status: Never Smoker   . Smokeless tobacco: Never Used  . Alcohol Use: No  . Drug Use: No  . Sexual Activity: Not Currently    Birth Control/ Protection: Surgical   Other Topics Concern  . Not on file   Social History Narrative  . No narrative on file     ROS A 10 point review of system was performed. It is negative other than that mentioned in the history of present illness.   PHYSICAL EXAM   BP 126/64  Pulse 58  Ht 5\' 3"  (1.6 m)  Wt 151 lb (68.493 kg)  BMI 26.76 kg/m2 Constitutional: She is oriented to person, place, and time. She appears well-developed and well-nourished. No distress.  HENT: No nasal discharge.  Head: Normocephalic and atraumatic.  Eyes: Pupils are equal and round. No discharge.  Neck: Normal range of motion. Neck supple. No JVD present. No thyromegaly present.  Cardiovascular: Normal rate, regular rhythm, normal heart sounds. Exam reveals no gallop and no friction rub. No murmur heard.  Pulmonary/Chest: Effort normal and breath sounds normal. No stridor. No respiratory distress. She has no wheezes. She has no rales. She exhibits no tenderness.  Abdominal: Soft. Bowel sounds are normal. She exhibits no distension. There is no tenderness. There is no rebound and no guarding.  Musculoskeletal: Normal range of motion. She exhibits no edema and no tenderness.  Neurological: She is alert and oriented to person, place, and time. Coordination normal.  Skin: Skin is warm and dry. No rash noted. She is not diaphoretic. No erythema. No pallor.  Psychiatric: She has a normal mood and affect. Her behavior is normal. Judgment and thought content normal.  Vascular: No carotid bruit. Radial: normal bilaterally, Femoral: normal bilaterally. DP: +2 on right and absent on left. PT: +1 on right and absent on left     ASSESSMENT AND PLAN

## 2013-12-26 NOTE — Assessment & Plan Note (Addendum)
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 ABI was 0.66 on left and 0.95 on right. The symptoms are significantly lifestyle limiting.  I disccussed with her and husband the natural history and management options of PAD including initial medical therapy vs. Angiography and possible endovascular intervention. She has been trying to exercise but has been very limited by left leg pain.  Due to that, the plan is to proceed with abdominal aortogram, LE run off and possible angioplasty. Risks were discussed.  She is already on optimal medical therapy.

## 2013-12-27 ENCOUNTER — Encounter (HOSPITAL_COMMUNITY): Payer: Self-pay | Admitting: Pharmacy Technician

## 2014-01-03 ENCOUNTER — Ambulatory Visit (HOSPITAL_COMMUNITY)
Admission: RE | Admit: 2014-01-03 | Discharge: 2014-01-03 | Disposition: A | Discharge status: Home / Self Care | Payer: Medicare Other | Source: Ambulatory Visit | Attending: Cardiovascular Disease | Admitting: Cardiovascular Disease

## 2014-01-03 ENCOUNTER — Encounter (HOSPITAL_COMMUNITY)
Admission: RE | Disposition: A | Discharge status: Home / Self Care | Payer: Self-pay | Source: Ambulatory Visit | Attending: Cardiovascular Disease

## 2014-01-03 ENCOUNTER — Other Ambulatory Visit: Payer: Self-pay | Admitting: Cardiovascular Disease

## 2014-01-03 DIAGNOSIS — I2510 Atherosclerotic heart disease of native coronary artery without angina pectoris: Secondary | ICD-10-CM | POA: Insufficient documentation

## 2014-01-03 DIAGNOSIS — Z7982 Long term (current) use of aspirin: Secondary | ICD-10-CM | POA: Insufficient documentation

## 2014-01-03 DIAGNOSIS — I70219 Atherosclerosis of native arteries of extremities with intermittent claudication, unspecified extremity: Secondary | ICD-10-CM

## 2014-01-03 DIAGNOSIS — Z794 Long term (current) use of insulin: Secondary | ICD-10-CM | POA: Insufficient documentation

## 2014-01-03 DIAGNOSIS — E039 Hypothyroidism, unspecified: Secondary | ICD-10-CM | POA: Insufficient documentation

## 2014-01-03 DIAGNOSIS — I739 Peripheral vascular disease, unspecified: Secondary | ICD-10-CM

## 2014-01-03 DIAGNOSIS — M329 Systemic lupus erythematosus, unspecified: Secondary | ICD-10-CM | POA: Insufficient documentation

## 2014-01-03 DIAGNOSIS — I10 Essential (primary) hypertension: Secondary | ICD-10-CM | POA: Insufficient documentation

## 2014-01-03 DIAGNOSIS — Z880 Allergy status to penicillin: Secondary | ICD-10-CM | POA: Insufficient documentation

## 2014-01-03 DIAGNOSIS — E785 Hyperlipidemia, unspecified: Secondary | ICD-10-CM | POA: Insufficient documentation

## 2014-01-03 DIAGNOSIS — E119 Type 2 diabetes mellitus without complications: Secondary | ICD-10-CM | POA: Insufficient documentation

## 2014-01-03 HISTORY — PX: ABDOMINAL AORTAGRAM: SHX5454

## 2014-01-03 HISTORY — PX: LOWER EXTREMITY ANGIOGRAM: SHX5508

## 2014-01-03 LAB — GLUCOSE, CAPILLARY
GLUCOSE-CAPILLARY: 172 mg/dL — AB (ref 70–99)
Glucose-Capillary: 179 mg/dL — ABNORMAL HIGH (ref 70–99)

## 2014-01-03 SURGERY — ABDOMINAL AORTAGRAM
Anesthesia: LOCAL

## 2014-01-03 MED ORDER — SODIUM CHLORIDE 0.9 % IV SOLN: 250.0000 mL | INTRAVENOUS | Status: DC | PRN

## 2014-01-03 MED ORDER — SODIUM CHLORIDE 0.9 % IV SOLN
INTRAVENOUS | Status: DC
  Administered 2014-01-03: 07:00:00 via INTRAVENOUS

## 2014-01-03 MED ORDER — ASPIRIN 81 MG PO CHEW
81.0000 mg | CHEWABLE_TABLET | ORAL | Status: AC
  Administered 2014-01-03: 81 mg via ORAL

## 2014-01-03 MED ORDER — HEPARIN (PORCINE) IN NACL 2-0.9 UNIT/ML-% IJ SOLN
INTRAMUSCULAR | Status: AC
  Filled 2014-01-03: qty 1000

## 2014-01-03 MED ORDER — FENTANYL CITRATE 0.05 MG/ML IJ SOLN
INTRAMUSCULAR | Status: AC
  Filled 2014-01-03: qty 2

## 2014-01-03 MED ORDER — LIDOCAINE HCL (PF) 1 % IJ SOLN
INTRAMUSCULAR | Status: AC
  Filled 2014-01-03: qty 30

## 2014-01-03 MED ORDER — CILOSTAZOL 50 MG PO TABS: 50.0000 mg | ORAL_TABLET | Freq: Two times a day (BID) | ORAL | Status: DC

## 2014-01-03 MED ORDER — SODIUM CHLORIDE 0.9 % IJ SOLN: 3.0000 mL | INTRAMUSCULAR | Status: DC | PRN

## 2014-01-03 MED ORDER — ASPIRIN 81 MG PO CHEW
CHEWABLE_TABLET | ORAL | Status: AC
  Administered 2014-01-03: 81 mg via ORAL
  Filled 2014-01-03: qty 1

## 2014-01-03 MED ORDER — ASPIRIN EC 81 MG PO TBEC: 81.0000 mg | DELAYED_RELEASE_TABLET | Freq: Every day | ORAL | Status: DC

## 2014-01-03 MED ORDER — SODIUM CHLORIDE 0.9 % IJ SOLN: 3.0000 mL | Freq: Two times a day (BID) | INTRAMUSCULAR | Status: DC

## 2014-01-03 MED ORDER — SODIUM CHLORIDE 0.9 % IV SOLN: INTRAVENOUS | Status: DC

## 2014-01-03 MED ORDER — MIDAZOLAM HCL 2 MG/2ML IJ SOLN
INTRAMUSCULAR | Status: AC
  Filled 2014-01-03: qty 2

## 2014-01-03 NOTE — Discharge Instructions (Signed)
° ° °  HOLD METFORMIN FOR 48 HOURS.   Arteriogram Care After These instructions give you information on caring for yourself after your procedure. Your doctor may also give you more specific instructions. Call your doctor if you have any problems or questions after your procedure. HOME CARE  Keep your leg straight for at least 6 hours.  Do not bathe, swim, or use a hot tub until directed by your doctor. You can shower.  Do not lift anything heavier than 10 pounds (about a gallon of milk) for 2 days.  Do not walk a lot, run, or drive for 2 days.  Return to normal activities in 2 days or as told by your doctor. Finding out the results of your test Ask when your test results will be ready. Make sure you get your test results. GET HELP RIGHT AWAY IF:   You have fever.  You have more pain in your leg.  The leg that was cut is:  Bleeding.  Puffy (swollen) or red.  Cold.  Pale or changes color.  Weak.  Tingly or numb. If you go to the Emergency Room, tell your nurse that you have had an arteriogram. Take this paper with you to show the nurse. MAKE SURE YOU:  Understand these instructions.  Will watch your condition.  Will get help right away if you are not doing well or get worse. Document Released: 09/04/2008 Document Revised: 06/13/2013 Document Reviewed: 09/04/2008 [facility] Patient Information 2015 [place], [place]. This information is not intended to replace advice given to you by your health care provider. Make sure you discuss any questions you have with your health care provider.

## 2014-01-03 NOTE — Interval H&P Note (Signed)
History and Physical Interval Note:  01/03/2014 8:40 AM  Virginia Huber  has presented today for surgery, with the diagnosis of pad  The various methods of treatment have been discussed with the patient and family. After consideration of risks, benefits and other options for treatment, the patient has consented to  Procedure(s): ABDOMINAL AORTAGRAM (N/A) LOWER EXTREMITY ANGIOGRAM (N/A) as a surgical intervention .  The patient's history has been reviewed, patient examined, no change in status, stable for surgery.  I have reviewed the patient's chart and labs.  Questions were answered to the patient's satisfaction.     Kathlyn Sacramento

## 2014-01-03 NOTE — H&P (View-Only) (Signed)
Primary cardiologist: Dr. Bronson Huber  HPI  This is a pleasant 67 year old who was referred for evaluation and management of PAD. She has known history of coronary artery disease, diabetes,  hypertension, and hyperlipidemia. Prior echocardiogram demonstrated normal left ventricular systolic function, EF 123456, severe LVH, and grade 2 diastolic dysfunction.   She denies chest pain and exertional dyspnea. She has been having cramping in her quadriceps, hamstrings, calves, and feet mostly affecting the left leg. This started few years ago but was initially mild. It became significantly worse over last few months. She and her husband just celebrated their 50th wedding anniversary and went on an [place] cruise. She felt very limited during the trip due to left leg claudication. The discomfort is happening after walking 80 feet and forces her to stop and rest for few minutes before she can resume. She also has severe left leg cramps at night.     Allergies  Allergen Reactions  . Penicillins Hives  . Percocet [Oxycodone-Acetaminophen] Nausea And Vomiting     Current Outpatient Prescriptions on File Prior to Visit  Medication Sig Dispense Refill  . amLODipine (NORVASC) 10 MG tablet Take 1 tablet (10 mg total) by mouth daily.  90 tablet  6  . aspirin 325 MG EC tablet Take 325 mg by mouth daily.        [name] atorvastatin (LIPITOR) 20 MG tablet Take 1 tablet (20 mg total) by mouth daily.  90 tablet  3  . Cholecalciferol (VITAMIN D) 2000 UNITS tablet Take 2,000 Units by mouth daily.        [name] glimepiride (AMARYL) 2 MG tablet       . insulin NPH-regular (NOVOLIN 70/30) (70-30) 100 UNIT/ML injection Inject 30 Units into the skin 2 (two) times daily with a meal.      . isosorbide dinitrate (ISORDIL) 10 MG tablet Take 1 tablet (10 mg total) by mouth 3 (three) times daily.  90 tablet  6  . labetalol (NORMODYNE) 300 MG tablet Take 1 tablet (300 mg total) by mouth 2 (two) times daily. Dose increase  180 tablet  1    . losartan (COZAAR) 100 MG tablet TAKE ONE (1) TABLET BY MOUTH EVERY DAY  90 tablet  2  . metFORMIN (GLUCOPHAGE) 500 MG tablet Takes 2 tablets in the morning and 1 at night.      . nitroGLYCERIN (NITROSTAT) 0.4 MG SL tablet Place 1 tablet (0.4 mg total) under the tongue every 5 (five) minutes as needed for chest pain (1 tablet every 5 minutes No more than 3 total per episode).  25 tablet  3  . Omega-3 Fatty Acids (FISH OIL) 1000 MG CAPS Take by mouth.       No current facility-administered medications on file prior to visit.     Past Medical History  Diagnosis Date  . HTN (hypertension)   . DM (diabetes mellitus)   . Heart block   . Hypothyroidism   . Ovarian tumor   . Lupus   . S/P colonoscopy 2005    Dr. Laural Golden: pancolonic divericula, polyp, path unknown currently  . Coronary artery disease   . UTI (urinary tract infection) 05/08/2013  . Superficial fungus infection of skin 06/29/2013     Past Surgical History  Procedure Laterality Date  . Cholecystectomy    . Colostomy      then reversed  . Partial hysterectomy      left ovaries, then ovaries removed later due tumors   . Oophorectomy  1980, nicked bowel, peritonitis, colostomy, now colostomy reversed   . Eye surgeries    . Cardiac catheterization  2008    Tiny OM-2 with 90% narrowing. Med tx.  . Nuclear med stress test  10/2011    Small area of mild ischemia inferoapically.     Family History  Problem Relation Age of Onset  . Heart disease Mother     deceased  . Heart disease Father     deceased, heart disease  . Colon cancer Neg Hx   . Diabetes Brother   . Heart disease Brother   . Thyroid disease Brother   . Heart disease Sister   . Heart disease Brother   . Thyroid disease Brother      History   Social History  . Marital Status: Married    Spouse Name: N/A    Number of Children: N/A  . Years of Education: N/A   Occupational History  . Retired     Research officer, political party   Social History Main  Topics  . Smoking status: Never Smoker   . Smokeless tobacco: Never Used  . Alcohol Use: No  . Drug Use: No  . Sexual Activity: Not Currently    Birth Control/ Protection: Surgical   Other Topics Concern  . Not on file   Social History Narrative  . No narrative on file     ROS A 10 point review of system was performed. It is negative other than that mentioned in the history of present illness.   PHYSICAL EXAM   BP 126/64  Pulse 58  Ht 5\' 3"  (1.6 m)  Wt 151 lb (68.493 kg)  BMI 26.76 kg/m2 Constitutional: She is oriented to person, place, and time. She appears well-developed and well-nourished. No distress.  HENT: No nasal discharge.  Head: Normocephalic and atraumatic.  Eyes: Pupils are equal and round. No discharge.  Neck: Normal range of motion. Neck supple. No JVD present. No thyromegaly present.  Cardiovascular: Normal rate, regular rhythm, normal heart sounds. Exam reveals no gallop and no friction rub. No murmur heard.  Pulmonary/Chest: Effort normal and breath sounds normal. No stridor. No respiratory distress. She has no wheezes. She has no rales. She exhibits no tenderness.  Abdominal: Soft. Bowel sounds are normal. She exhibits no distension. There is no tenderness. There is no rebound and no guarding.  Musculoskeletal: Normal range of motion. She exhibits no edema and no tenderness.  Neurological: She is alert and oriented to person, place, and time. Coordination normal.  Skin: Skin is warm and dry. No rash noted. She is not diaphoretic. No erythema. No pallor.  Psychiatric: She has a normal mood and affect. Her behavior is normal. Judgment and thought content normal.  Vascular: No carotid bruit. Radial: normal bilaterally, Femoral: normal bilaterally. DP: +2 on right and absent on left. PT: +1 on right and absent on left     ASSESSMENT AND PLAN

## 2014-01-03 NOTE — CV Procedure (Signed)
PERIPHERAL VASCULAR PROCEDURE  NAME:  Virginia Huber   MRN: [id number] DOB:  Aug 21, 1945   ADMIT DATE: 01/03/2014  Performing Cardiologist: Kathlyn Sacramento Primary Physician: Purvis Kilts, MD Primary Cardiologist:  Dr. Bronson Ing  Procedures Performed:  Abdominal Aortic Angiogram with Bi-Iliofemoral Runoff  Bilateral Lower Extremity Angiography (1st Order)    Indication(s):   Claudication    Consent: The procedure with Risks/Benefits/Alternatives and Indications was reviewed with the patient .  All questions were answered.  Medications:  Sedation:  1 mg IV Versed, 75 mcg IV Fentanyl  Contrast:  175 ml  Visipaque   Procedural details: The right groin was prepped, draped, and anesthetized with 1% lidocaine. Using modified Seldinger technique, a 5 French sheath was introduced into the right common femoral artery. A 5 Fr Short Pigtail Catheter was advanced of over a  Versicore wire into the descending Aorta to a level just above the renal arteries. A power injection of 72ml/sec contrast over 1 sec was performed for Abdominal Aortic Angiography.  The catheter was then pulled back to a level just above the Aortic bifurcation, and a second power injection was performed to evaluate the iliac arteries.   Bilateral lower extremity arterial run off was then performed via power injection of 7 ml / sec contrast for a total of 77 ml. Extra diagnostic pictures were taken below the knee.  the patient had significant discomfort during dye injection. This was treated with fentanyl..   The patient tolerated the procedure well with no immediate complications.   Hemodynamics:  Central Aortic Pressure / Mean Aortic Pressure: 156/64  Findings:  Abdominal aorta:  Normal in size with no evidence of aneurysm or significant stenosis.  Left renal artery:  Normal  Right renal artery:  Normal  Celiac artery:  Patient  Superior mesenteric artery:  Patent  Right common iliac artery:  Minor  irregularities.  Right internal iliac artery:  Normal  Right external iliac artery:  Normal  Right common femoral artery:  Normal  Right profunda femoral artery:  Normal  Right superficial femoral artery:  Mildly calcified with diffuse mild disease. There is a 30% mid stenosis. Her  Right popliteal artery:  30% mid stenosis.  Right tibial peroneal trunk:  70% disease.  Right anterior tibial artery:  Patent with diffuse 70% disease throughout its course.  Right peroneal artery:  Patent but overall small with diffuse 70% disease.  Right posterior tibial artery:  Occluded proximally.  Left common iliac artery:  Normal  Left internal iliac artery:  Normal  Left external iliac artery:  Normal  Left common femoral artery:  Normal   Left profunda femoral artery:  Normal   Left superficial femoral artery:  Mildly calcified with mild diffuse atherosclerosis without obstructive disease.  Left popliteal artery:  Focal 95% stenosis in the midsegment behind the knee.  Left tibial peroneal trunk:  80% proximal disease.  Left anterior tibial artery:  Occluded proximally shortly after its origin with reconstitution via collaterals. This is the only patent vessel to the foot.  Left peroneal artery:  Occluded proximally.  Left posterior tibial artery:  Occluded proximally.   Conclusions:  1. No significant aortoiliac disease. 2. Severe focal left popliteal artery stenosis with occluded tibioperoneal  Vessels. The anterior tibial is occluded proximally with reconstitution. This represents the only patent vessel to the foot. 3. Two-vessel runoff on the right side with diffuse disease affecting the right anterior tibial and peroneal arteries.   Recommendations:  Attempt medical therapy and  a walking program. No good options for endovascular intervention. I started the patient on cilostazol. If no significant improvement, the plan is to proceed with left anterior tibial and popliteal  artery angioplasty.   Kathlyn Sacramento, MD, [facility] 01/03/2014 9:13 AM

## 2014-01-03 NOTE — Progress Notes (Signed)
Site area: right femoral  Site Prior to Removal:  Level 0  Pressure Applied For 20 MINUTES , removed by Jodette briley,RN   Manual:   yes  Patient Status During Pull:  stable Post Pull Groin Site:  Level 0  Post Pull Instructions Given:  yes  Post Pull Pulses Present: yes  Dressing Applied:  tegaderm dressing   Bedrest [id number] Comments VSS. No complications

## 2014-01-03 NOTE — Progress Notes (Signed)
JBriley,RN removed rt femoral arterial sheath

## 2014-01-30 ENCOUNTER — Ambulatory Visit (INDEPENDENT_AMBULATORY_CARE_PROVIDER_SITE_OTHER): Payer: Medicare Other | Admitting: Cardiovascular Disease

## 2014-01-30 ENCOUNTER — Encounter: Payer: Self-pay | Admitting: Cardiovascular Disease

## 2014-01-30 VITALS — BP 130/62 | HR 63 | Ht 63.0 in | Wt 151.8 lb

## 2014-01-30 DIAGNOSIS — I739 Peripheral vascular disease, unspecified: Secondary | ICD-10-CM

## 2014-01-30 DIAGNOSIS — E785 Hyperlipidemia, unspecified: Secondary | ICD-10-CM

## 2014-01-30 DIAGNOSIS — I10 Essential (primary) hypertension: Secondary | ICD-10-CM

## 2014-01-30 DIAGNOSIS — I2510 Atherosclerotic heart disease of native coronary artery without angina pectoris: Secondary | ICD-10-CM

## 2014-01-30 MED ORDER — CILOSTAZOL 100 MG PO TABS: 100.0000 mg | ORAL_TABLET | Freq: Two times a day (BID) | ORAL | Status: DC

## 2014-01-30 MED ORDER — ASPIRIN EC 81 MG PO TBEC: 81.0000 mg | DELAYED_RELEASE_TABLET | Freq: Every day | ORAL | Status: DC

## 2014-01-30 NOTE — Progress Notes (Signed)
Primary cardiologist: Dr. Bronson Ing  HPI  This is a pleasant 68 year old who is here today for a followup visit regarding  PAD. She has known history of coronary artery disease, diabetes,  hypertension, and hyperlipidemia. Prior echocardiogram demonstrated normal left ventricular systolic function, EF 123456, severe LVH, and grade 2 diastolic dysfunction.   She was seen recently for cramping in her quadriceps, hamstrings, calves, and feet mostly affecting the left leg. The symptoms were consistent with claudication. Right ABI was 0.95 and left was 0.66. I proceeded with angiography which showed:  1. No significant aortoiliac disease.  2. Severe focal left popliteal artery stenosis with occluded tibioperoneal Vessels. The anterior tibial is occluded proximally with reconstitution. This represents the only patent vessel to the foot.  3. Two-vessel runoff on the right side with diffuse disease affecting the right anterior tibial and peroneal arteries.   She was started on Pletal 50 mg twice daily and instructed her to start a walking program. She has been doing that daily since her angiogram with gradual improvement in symptoms. There is no rest pain or lower extremity ulceration. She is walking 2 miles every day but usually has to stop after about 0.5 miles.   Allergies  Allergen Reactions  . Penicillins Hives  . Percocet [Oxycodone-Acetaminophen] Nausea And Vomiting     Current Outpatient Prescriptions on File Prior to Visit  Medication Sig Dispense Refill  . amLODipine (NORVASC) 10 MG tablet Take 10 mg by mouth daily.      [name] atorvastatin (LIPITOR) 20 MG tablet Take 20 mg by mouth every other day.      . cholecalciferol (VITAMIN D) 1000 UNITS tablet Take 1,000 Units by mouth daily.      . cilostazol (PLETAL) 50 MG tablet Take 1 tablet (50 mg total) by mouth 2 (two) times daily.  60 tablet  3  . glimepiride (AMARYL) 2 MG tablet Take 2 mg by mouth daily with breakfast.      . insulin NPH  Human (HUMULIN N,NOVOLIN N) 100 UNIT/ML injection Inject 30-35 Units into the skin 2 (two) times daily before a meal. *per sliding scale*      . isosorbide dinitrate (ISORDIL) 10 MG tablet Take 10 mg by mouth 3 (three) times daily.      [name] labetalol (NORMODYNE) 300 MG tablet Take 300 mg by mouth 2 (two) times daily.      [name] levothyroxine (SYNTHROID, LEVOTHROID) 137 MCG tablet Take 137 mcg by mouth daily before breakfast.      . losartan (COZAAR) 100 MG tablet Take 100 mg by mouth daily.      . metFORMIN (GLUCOPHAGE) 500 MG tablet Take 500-1,000 mg by mouth 2 (two) times daily with a meal. Takes 1000mg  in the morning and 500mg  at night      . nitroGLYCERIN (NITROSTAT) 0.4 MG SL tablet Place 0.4 mg under the tongue every 5 (five) minutes as needed for chest pain.      . Omega-3 Fatty Acids (FISH OIL) 1000 MG CAPS Take 2,000 mg by mouth daily.        No current facility-administered medications on file prior to visit.     Past Medical History  Diagnosis Date  . HTN (hypertension)   . DM (diabetes mellitus)   . Heart block   . Hypothyroidism   . Ovarian tumor   . Lupus   . S/P colonoscopy 2005    Dr. Laural Golden: pancolonic divericula, polyp, path unknown currently  . Coronary artery disease   .  UTI (urinary tract infection) 05/08/2013  . Superficial fungus infection of skin 06/29/2013     Past Surgical History  Procedure Laterality Date  . Cholecystectomy    . Colostomy      then reversed  . Partial hysterectomy      left ovaries, then ovaries removed later due tumors   . Oophorectomy      1980, nicked bowel, peritonitis, colostomy, now colostomy reversed   . Eye surgeries    . Cardiac catheterization  2008    Tiny OM-2 with 90% narrowing. Med tx.  . Nuclear med stress test  10/2011    Small area of mild ischemia inferoapically.     Family History  Problem Relation Age of Onset  . Heart disease Mother     deceased  . Heart disease Father     deceased, heart disease  . Colon  cancer Neg Hx   . Diabetes Brother   . Heart disease Brother   . Thyroid disease Brother   . Heart disease Sister   . Heart disease Brother   . Thyroid disease Brother      History   Social History  . Marital Status: Married    Spouse Name: N/A    Number of Children: N/A  . Years of Education: N/A   Occupational History  . Retired     Research officer, political party   Social History Main Topics  . Smoking status: Never Smoker   . Smokeless tobacco: Never Used  . Alcohol Use: No  . Drug Use: No  . Sexual Activity: Not Currently    Birth Control/ Protection: Surgical   Other Topics Concern  . Not on file   Social History Narrative  . No narrative on file     ROS A 10 point review of system was performed. It is negative other than that mentioned in the history of present illness.   PHYSICAL EXAM   BP 130/62  Pulse 63  Ht 5\' 3"  (1.6 m)  Wt 151 lb 12.8 oz (68.856 kg)  BMI 26.90 kg/m2 Constitutional: She is oriented to person, place, and time. She appears well-developed and well-nourished. No distress.  HENT: No nasal discharge.  Head: Normocephalic and atraumatic.  Eyes: Pupils are equal and round. No discharge.  Neck: Normal range of motion. Neck supple. No JVD present. No thyromegaly present.  Cardiovascular: Normal rate, regular rhythm, normal heart sounds. Exam reveals no gallop and no friction rub. No murmur heard.  Pulmonary/Chest: Effort normal and breath sounds normal. No stridor. No respiratory distress. She has no wheezes. She has no rales. She exhibits no tenderness.  Abdominal: Soft. Bowel sounds are normal. She exhibits no distension. There is no tenderness. There is no rebound and no guarding.  Musculoskeletal: Normal range of motion. She exhibits no edema and no tenderness.  Neurological: She is alert and oriented to person, place, and time. Coordination normal.  Skin: Skin is warm and dry. No rash noted. She is not diaphoretic. No erythema. No pallor.    Psychiatric: She has a normal mood and affect. Her behavior is normal. Judgment and thought content normal.  Vascular: No carotid bruit. Radial: normal bilaterally, Femoral: normal bilaterally. DP: +2 on right and absent on left. PT: +1 on right and absent on left No groin hematoma.    ASSESSMENT AND PLAN

## 2014-01-30 NOTE — Patient Instructions (Signed)
Your physician has recommended you make the following change in your medication:  1. DECREASE Aspirin to 81mg  take one by mouth daily 2. INCREASE Pletal to 100mg  take one by mouth twice a day  Your physician recommends that you schedule a follow-up appointment in: 3 MONTHS with Dr Fletcher Anon

## 2014-01-31 ENCOUNTER — Encounter: Payer: Self-pay | Admitting: Cardiovascular Disease

## 2014-01-31 NOTE — Assessment & Plan Note (Signed)
Lab Results  Component Value Date   CHOL 127 02/29/2012   HDL 25* 02/29/2012   LDLCALC 24 02/29/2012   TRIG 392* 02/29/2012   CHOLHDL 5.1 02/29/2012   Continue treatment with atorvastatin with a target LDL of less than 70.

## 2014-01-31 NOTE — Assessment & Plan Note (Signed)
Blood pressure is reasonably controlled on current medications. 

## 2014-01-31 NOTE — Assessment & Plan Note (Signed)
The patient has moderate to severe left leg claudication due to popliteal and tibial vessel disease. No easy options for endovascular intervention. She reports gradual improvement with Pletal and walking program. I increased the dose of Pletal to 100 mg twice daily and decrease the dose of aspirin to 81 mg once daily. Continue current walking program and reevaluate symptoms in 3 months.

## 2014-01-31 NOTE — Assessment & Plan Note (Signed)
She reports one episode of chest pain with walking recently but there has been no recurrent symptoms even with exercise. Continue to monitor.

## 2014-04-06 ENCOUNTER — Other Ambulatory Visit: Payer: Self-pay

## 2014-04-23 ENCOUNTER — Encounter: Payer: Self-pay | Admitting: Cardiovascular Disease

## 2014-04-23 ENCOUNTER — Other Ambulatory Visit: Payer: Self-pay | Admitting: Cardiovascular Disease

## 2014-05-08 ENCOUNTER — Encounter: Payer: Self-pay | Admitting: Cardiovascular Disease

## 2014-05-08 ENCOUNTER — Ambulatory Visit (INDEPENDENT_AMBULATORY_CARE_PROVIDER_SITE_OTHER): Payer: Medicare Other | Admitting: Cardiovascular Disease

## 2014-05-08 VITALS — BP 150/68 | HR 77 | Ht 63.0 in | Wt 155.1 lb

## 2014-05-08 DIAGNOSIS — E785 Hyperlipidemia, unspecified: Secondary | ICD-10-CM

## 2014-05-08 DIAGNOSIS — I10 Essential (primary) hypertension: Secondary | ICD-10-CM

## 2014-05-08 DIAGNOSIS — I739 Peripheral vascular disease, unspecified: Secondary | ICD-10-CM

## 2014-05-08 NOTE — Assessment & Plan Note (Signed)
The patient has moderate to severe left leg claudication due to popliteal and tibial vessel disease. No easy options for endovascular intervention. She reports gradual improvement with Pletal and walking program.  Surgical revascularization below the knee is limited by decreased long-term patency. Thus, I recommend reserving revascularization for critical limb ischemia. Continue current management.

## 2014-05-08 NOTE — Assessment & Plan Note (Signed)
Continue treatment with atorvastatin with a target LDL of less than 70. 

## 2014-05-08 NOTE — Progress Notes (Signed)
Primary cardiologist: Dr. Bronson Ing  HPI  This is a pleasant 68 year old who is here today for a followup visit regarding PAD. She has known history of coronary artery disease, diabetes, hypertension, and hyperlipidemia. Prior echocardiogram demonstrated normal left ventricular systolic function, EF 123456, severe LVH, and grade 2 diastolic dysfunction.   She was seen  for cramping in her quadriceps, hamstrings, calves, and feet mostly affecting the left leg. The symptoms were consistent with claudication. Right ABI was 0.95 and left was 0.66. I proceeded with angiography in 12/2013 which showed:  1. No significant aortoiliac disease.  2. Severe focal left popliteal artery stenosis with occluded tibioperoneal Vessels. The anterior tibial is occluded proximally with reconstitution. This represents the only patent vessel to the foot.  3. Two-vessel runoff on the right side with diffuse disease affecting the right anterior tibial and peroneal arteries.   She was started on Pletal with some improvement in claudication. She is able to walk for one and a half miles that she has to stop every 15 minutes. There is no rest pain or lower extremity ulceration.    Allergies  Allergen Reactions  . Penicillins Hives  . Percocet [Oxycodone-Acetaminophen] Nausea And Vomiting     Current Outpatient Prescriptions on File Prior to Visit  Medication Sig Dispense Refill  . amLODipine (NORVASC) 10 MG tablet Take 10 mg by mouth daily.    [name] aspirin EC 81 MG tablet Take 1 tablet (81 mg total) by mouth daily.    [name] atorvastatin (LIPITOR) 20 MG tablet Take 20 mg by mouth every other day.    . cholecalciferol (VITAMIN D) 1000 UNITS tablet Take 1,000 Units by mouth daily.    . cilostazol (PLETAL) 100 MG tablet Take 1 tablet (100 mg total) by mouth 2 (two) times daily. 180 tablet 3  . glimepiride (AMARYL) 2 MG tablet Take 2 mg by mouth daily with breakfast.    . insulin NPH Human (HUMULIN N,NOVOLIN N) 100 UNIT/ML  injection Inject 30-35 Units into the skin 2 (two) times daily before a meal. *per sliding scale*    . isosorbide dinitrate (ISORDIL) 10 MG tablet Take 10 mg by mouth 3 (three) times daily.    [name] labetalol (NORMODYNE) 300 MG tablet TAKE ONE TABLET TWICE DAILY 180 tablet 3  . levothyroxine (SYNTHROID, LEVOTHROID) 137 MCG tablet Take 137 mcg by mouth daily before breakfast.    . losartan (COZAAR) 100 MG tablet Take 100 mg by mouth daily.    . metFORMIN (GLUCOPHAGE) 500 MG tablet Take 500-1,000 mg by mouth 2 (two) times daily with a meal. Takes 1000mg  in the morning and 500mg  at night    . nitroGLYCERIN (NITROSTAT) 0.4 MG SL tablet Place 0.4 mg under the tongue every 5 (five) minutes as needed for chest pain.    . Omega-3 Fatty Acids (FISH OIL) 1000 MG CAPS Take 2,000 mg by mouth daily.      No current facility-administered medications on file prior to visit.     Past Medical History  Diagnosis Date  . HTN (hypertension)   . DM (diabetes mellitus)   . Heart block   . Hypothyroidism   . Ovarian tumor   . Lupus   . S/P colonoscopy 2005    Dr. Laural Golden: pancolonic divericula, polyp, path unknown currently  . Coronary artery disease   . UTI (urinary tract infection) 05/08/2013  . Superficial fungus infection of skin 06/29/2013     Past Surgical History  Procedure Laterality Date  .  Cholecystectomy    . Colostomy      then reversed  . Partial hysterectomy      left ovaries, then ovaries removed later due tumors   . Oophorectomy      1980, nicked bowel, peritonitis, colostomy, now colostomy reversed   . Eye surgeries    . Cardiac catheterization  2008    Tiny OM-2 with 90% narrowing. Med tx.  . Nuclear med stress test  10/2011    Small area of mild ischemia inferoapically.     Family History  Problem Relation Age of Onset  . Heart disease Mother     deceased  . Heart disease Father     deceased, heart disease  . Colon cancer Neg Hx   . Diabetes Brother   . Heart disease Brother    . Thyroid disease Brother   . Heart disease Sister   . Heart disease Brother   . Thyroid disease Brother      History   Social History  . Marital Status: Married    Spouse Name: N/A    Number of Children: N/A  . Years of Education: N/A   Occupational History  . Retired     Research officer, political party   Social History Main Topics  . Smoking status: Never Smoker   . Smokeless tobacco: Never Used  . Alcohol Use: No  . Drug Use: No  . Sexual Activity: Not Currently    Birth Control/ Protection: Surgical   Other Topics Concern  . Not on file   Social History Narrative     ROS A 10 point review of system was performed. It is negative other than that mentioned in the history of present illness.   PHYSICAL EXAM   BP 150/68 mmHg  Pulse 77  Ht 5\' 3"  (1.6 m)  Wt 155 lb 1.9 oz (70.362 kg)  BMI 27.49 kg/m2 Constitutional: She is oriented to person, place, and time. She appears well-developed and well-nourished. No distress.  HENT: No nasal discharge.  Head: Normocephalic and atraumatic.  Eyes: Pupils are equal and round. No discharge.  Neck: Normal range of motion. Neck supple. No JVD present. No thyromegaly present.  Cardiovascular: Normal rate, regular rhythm, normal heart sounds. Exam reveals no gallop and no friction rub. No murmur heard.  Pulmonary/Chest: Effort normal and breath sounds normal. No stridor. No respiratory distress. She has no wheezes. She has no rales. She exhibits no tenderness.  Abdominal: Soft. Bowel sounds are normal. She exhibits no distension. There is no tenderness. There is no rebound and no guarding.  Musculoskeletal: Normal range of motion. She exhibits no edema and no tenderness.  Neurological: She is alert and oriented to person, place, and time. Coordination normal.  Skin: Skin is warm and dry. No rash noted. She is not diaphoretic. No erythema. No pallor.  Psychiatric: She has a normal mood and affect. Her behavior is normal. Judgment and  thought content normal.  Vascular: No carotid bruit. Radial: normal bilaterally, Femoral: normal bilaterally. DP: +2 on right and absent on left. PT: +1 on right and absent on left    ASSESSMENT AND PLAN

## 2014-05-08 NOTE — Assessment & Plan Note (Signed)
Blood pressure is reasonably controlled on current medications. 

## 2014-05-08 NOTE — Patient Instructions (Signed)
Your physician wants you to follow-up in: 6 MONTHS with Dr Arida.  You will receive a reminder letter in the mail two months in advance. If you don't receive a letter, please call our office to schedule the follow-up appointment.  Your physician recommends that you continue on your current medications as directed. Please refer to the Current Medication list given to you today.  

## 2014-05-21 ENCOUNTER — Ambulatory Visit: Payer: Medicare Other | Admitting: Cardiovascular Disease

## 2014-05-21 ENCOUNTER — Other Ambulatory Visit: Payer: Self-pay | Admitting: Adult Health

## 2014-05-21 DIAGNOSIS — Z1231 Encounter for screening mammogram for malignant neoplasm of breast: Secondary | ICD-10-CM

## 2014-05-30 ENCOUNTER — Ambulatory Visit (HOSPITAL_COMMUNITY)
Admission: RE | Admit: 2014-05-30 | Discharge: 2014-05-30 | Disposition: A | Discharge status: Home / Self Care | Payer: Medicare Other | Source: Ambulatory Visit | Attending: Adult Health | Admitting: Adult Health

## 2014-05-30 DIAGNOSIS — Z1231 Encounter for screening mammogram for malignant neoplasm of breast: Secondary | ICD-10-CM | POA: Diagnosis present

## 2014-05-31 ENCOUNTER — Ambulatory Visit (INDEPENDENT_AMBULATORY_CARE_PROVIDER_SITE_OTHER): Payer: Medicare Other | Admitting: Cardiovascular Disease

## 2014-05-31 ENCOUNTER — Encounter (HOSPITAL_COMMUNITY): Payer: Self-pay | Admitting: Cardiovascular Disease

## 2014-05-31 VITALS — BP 138/72 | HR 68 | Ht 63.0 in | Wt 153.0 lb

## 2014-05-31 DIAGNOSIS — I10 Essential (primary) hypertension: Secondary | ICD-10-CM

## 2014-05-31 DIAGNOSIS — I25118 Atherosclerotic heart disease of native coronary artery with other forms of angina pectoris: Secondary | ICD-10-CM

## 2014-05-31 DIAGNOSIS — R079 Chest pain, unspecified: Secondary | ICD-10-CM

## 2014-05-31 DIAGNOSIS — E785 Hyperlipidemia, unspecified: Secondary | ICD-10-CM

## 2014-05-31 DIAGNOSIS — G4730 Sleep apnea, unspecified: Secondary | ICD-10-CM

## 2014-05-31 DIAGNOSIS — I739 Peripheral vascular disease, unspecified: Secondary | ICD-10-CM

## 2014-05-31 MED ORDER — ISOSORBIDE DINITRATE 20 MG PO TABS: 20.0000 mg | ORAL_TABLET | Freq: Three times a day (TID) | ORAL | Status: DC

## 2014-05-31 NOTE — Progress Notes (Signed)
Patient ID: Virginia Huber, female   DOB: [birth year], 68 y.o.   MRN: [id number]      SUBJECTIVE: The patient is here to followup for coronary artery disease, hypertension, PVD, and hyperlipidemia. Prior echocardiogram demonstrated normal left ventricular systolic function, EF 123456, severe LVH, and grade 2 diastolic dysfunction.  Lipids performed on 09/26/2013 demonstrated LDL 64, HDL 25, total cholesterol 133, triglycerides 222. She continues to have claudication and feels the Pletal has only helped to a mild degree. She has chest pains which she describes as indigestion, occurring once a week, and usually relieved with 1-2 SL nitro tablets. She and her husband celebrated their 50th wedding anniversary by going on an [place] cruise, and they had a tremendous experience.   Review of Systems: As per "subjective", otherwise negative.  Allergies  Allergen Reactions  . Penicillins Hives  . Percocet [Oxycodone-Acetaminophen] Nausea And Vomiting    Current Outpatient Prescriptions  Medication Sig Dispense Refill  . amLODipine (NORVASC) 10 MG tablet Take 10 mg by mouth daily.    [name] aspirin EC 81 MG tablet Take 1 tablet (81 mg total) by mouth daily.    [name] atorvastatin (LIPITOR) 20 MG tablet Take 20 mg by mouth every other day.    . cholecalciferol (VITAMIN D) 1000 UNITS tablet Take 1,000 Units by mouth daily.    . cilostazol (PLETAL) 100 MG tablet Take 1 tablet (100 mg total) by mouth 2 (two) times daily. 180 tablet 3  . glimepiride (AMARYL) 2 MG tablet Take 2 mg by mouth daily with breakfast.    . insulin NPH Human (HUMULIN N,NOVOLIN N) 100 UNIT/ML injection Inject 30-35 Units into the skin 2 (two) times daily before a meal. *per sliding scale*    . isosorbide dinitrate (ISORDIL) 10 MG tablet Take 10 mg by mouth 3 (three) times daily.    [name] labetalol (NORMODYNE) 300 MG tablet TAKE ONE TABLET TWICE DAILY 180 tablet 3  . levothyroxine (SYNTHROID, LEVOTHROID) 137 MCG tablet Take 137 mcg by mouth  daily before breakfast.    . losartan (COZAAR) 100 MG tablet Take 100 mg by mouth daily.    . metFORMIN (GLUCOPHAGE) 500 MG tablet Take 500-1,000 mg by mouth 2 (two) times daily with a meal. Takes 1000mg  in the morning and 500mg  at night    . nitroGLYCERIN (NITROSTAT) 0.4 MG SL tablet Place 0.4 mg under the tongue every 5 (five) minutes as needed for chest pain.    . Omega-3 Fatty Acids (FISH OIL) 1000 MG CAPS Take 2,000 mg by mouth daily.     [name] rOPINIRole (REQUIP) 0.5 MG tablet Take 0.5 mg by mouth at bedtime.   0   No current facility-administered medications for this visit.    Past Medical History  Diagnosis Date  . HTN (hypertension)   . DM (diabetes mellitus)   . Heart block   . Hypothyroidism   . Ovarian tumor   . Lupus   . S/P colonoscopy 2005    Dr. Laural Golden: pancolonic divericula, polyp, path unknown currently  . Coronary artery disease   . UTI (urinary tract infection) 05/08/2013  . Superficial fungus infection of skin 06/29/2013    Past Surgical History  Procedure Laterality Date  . Cholecystectomy    . Colostomy      then reversed  . Partial hysterectomy      left ovaries, then ovaries removed later due tumors   . Oophorectomy      1980, nicked bowel, peritonitis, colostomy, now colostomy reversed   .  Eye surgeries    . Cardiac catheterization  2008    Tiny OM-2 with 90% narrowing. Med tx.  . Nuclear med stress test  10/2011    Small area of mild ischemia inferoapically.  . Abdominal aortagram N/A 01/03/2014    Procedure: ABDOMINAL [name];  Surgeon: Wellington Hampshire, MD;  Location: [facility] CATH LAB;  Service: Cardiovascular;  Laterality: N/A;  . Lower extremity angiogram N/A 01/03/2014    Procedure: LOWER EXTREMITY ANGIOGRAM;  Surgeon: Wellington Hampshire, MD;  Location: [place] CATH LAB;  Service: Cardiovascular;  Laterality: N/A;    History   Social History  . Marital Status: Married    Spouse Name: N/A    Number of Children: N/A  . Years of Education: N/A    Occupational History  . Retired     Research officer, political party   Social History Main Topics  . Smoking status: Never Smoker   . Smokeless tobacco: Never Used  . Alcohol Use: No  . Drug Use: No  . Sexual Activity: Not Currently    Birth Control/ Protection: Surgical   Other Topics Concern  . Not on file   Social History Narrative     Filed Vitals:   05/31/14 1314  BP: 138/72  Pulse: 68  Height: 5\' 3"  (1.6 m)  Weight: 153 lb (69.4 kg)    PHYSICAL EXAM General: NAD HEENT: Normal. Neck: No JVD, no thyromegaly. Lungs: Clear to auscultation bilaterally with normal respiratory effort. CV: Nondisplaced PMI.  Regular rate and rhythm, normal S1/S2, no S3/S4, no murmur. No pretibial or periankle edema.  No carotid bruit. Diminished pedal pulses.  Abdomen: Soft, nontender, no hepatosplenomegaly, no distention.  Neurologic: Alert and oriented x 3.  Psych: Normal affect. Skin: Normal. Musculoskeletal: Normal range of motion, no gross deformities. Extremities: No clubbing or cyanosis.   ECG: Most recent ECG reviewed.      ASSESSMENT AND PLAN: 1. CAD and h/o MI: Relatively stable ischemic heart disease. I will continue increase isosorbide dinitrate to 20 mg tid. No ischemia on prior nuclear MPI.  2. Essential HTN: Controlled on present therapy. Continue labetalol 300 mg bid. 3. Hyperlipidemia: Lipid results as noted above. Continue Lipitor 20 mg daily and fish oil. 4. Daytime somnolence and fatigue: She was scheduled by me to have a sleep study but did not have it. 5. PVD: Continue Pletal. Saw Dr. Fletcher Anon last month, who recommended reserving revascularization for critical limb ischemia. I suggested aqua therapy.  Dispo: f/u 6 months.    Kate Sable, M.D., F.A.C.C.

## 2014-05-31 NOTE — Patient Instructions (Signed)
Your physician wants you to follow-up in: 6 months with Dr. Bronson Ing. You will receive a reminder letter in the mail two months in advance. If you don't receive a letter, please call our office to schedule the follow-up appointment.  Your physician has recommended you make the following change in your medication:   INCREASE: Isosorbide to 20mg  three times a day   Thank you for choosing [facility]!

## 2014-06-04 ENCOUNTER — Other Ambulatory Visit: Payer: Self-pay | Admitting: Cardiovascular Disease

## 2014-07-24 ENCOUNTER — Other Ambulatory Visit: Payer: Self-pay | Admitting: Cardiology

## 2014-10-22 ENCOUNTER — Other Ambulatory Visit (HOSPITAL_COMMUNITY): Payer: Self-pay | Admitting: Family Medicine

## 2014-10-22 ENCOUNTER — Ambulatory Visit (HOSPITAL_COMMUNITY)
Admission: RE | Admit: 2014-10-22 | Discharge: 2014-10-22 | Disposition: A | Discharge status: Home / Self Care | Payer: PPO | Source: Ambulatory Visit | Attending: Family Medicine | Admitting: Family Medicine

## 2014-10-22 DIAGNOSIS — I739 Peripheral vascular disease, unspecified: Secondary | ICD-10-CM | POA: Insufficient documentation

## 2014-10-22 DIAGNOSIS — M25552 Pain in left hip: Secondary | ICD-10-CM | POA: Diagnosis present

## 2014-10-23 ENCOUNTER — Other Ambulatory Visit (HOSPITAL_COMMUNITY): Payer: Self-pay | Admitting: Family Medicine

## 2014-10-23 DIAGNOSIS — M1612 Unilateral primary osteoarthritis, left hip: Secondary | ICD-10-CM

## 2014-10-31 ENCOUNTER — Ambulatory Visit (HOSPITAL_COMMUNITY)
Admission: RE | Admit: 2014-10-31 | Discharge: 2014-10-31 | Disposition: A | Discharge status: Home / Self Care | Payer: PPO | Source: Ambulatory Visit | Attending: Family Medicine | Admitting: Family Medicine

## 2014-10-31 DIAGNOSIS — M1612 Unilateral primary osteoarthritis, left hip: Secondary | ICD-10-CM

## 2014-11-06 ENCOUNTER — Encounter: Payer: Self-pay | Admitting: Cardiovascular Disease

## 2014-11-06 ENCOUNTER — Ambulatory Visit (INDEPENDENT_AMBULATORY_CARE_PROVIDER_SITE_OTHER): Payer: PPO | Admitting: Cardiovascular Disease

## 2014-11-06 VITALS — BP 122/54 | HR 75 | Ht 63.0 in | Wt 149.0 lb

## 2014-11-06 DIAGNOSIS — I739 Peripheral vascular disease, unspecified: Secondary | ICD-10-CM | POA: Diagnosis not present

## 2014-11-06 DIAGNOSIS — E785 Hyperlipidemia, unspecified: Secondary | ICD-10-CM | POA: Diagnosis not present

## 2014-11-06 DIAGNOSIS — I2510 Atherosclerotic heart disease of native coronary artery without angina pectoris: Secondary | ICD-10-CM

## 2014-11-06 DIAGNOSIS — I10 Essential (primary) hypertension: Secondary | ICD-10-CM

## 2014-11-06 NOTE — Assessment & Plan Note (Signed)
Continue treatment with atorvastatin with a target LDL of less than 70. 

## 2014-11-06 NOTE — Assessment & Plan Note (Signed)
Blood pressure is well controlled on current medication.

## 2014-11-06 NOTE — Assessment & Plan Note (Signed)
She has no symptoms suggestive of angina. Continue medical therapy.

## 2014-11-06 NOTE — Progress Notes (Signed)
Primary cardiologist: Dr. Bronson Ing  HPI  This is a pleasant 69 year old who is here today for a followup visit regarding PAD. She has known history of coronary artery disease, diabetes, hypertension, and hyperlipidemia. Prior echocardiogram demonstrated normal left ventricular systolic function, EF 123456, severe LVH, and grade 2 diastolic dysfunction.   She was seen  for cramping in her quadriceps, hamstrings, calves, and feet mostly affecting the left leg. The symptoms were consistent with claudication. Right ABI was 0.95 and left was 0.66. I proceeded with angiography in 12/2013 which showed:  1. No significant aortoiliac disease.  2. Severe focal left popliteal artery stenosis with occluded tibioperoneal Vessels. The anterior tibial is occluded proximally with reconstitution. This represents the only patent vessel to the foot.  3. Two-vessel runoff on the right side with diffuse disease affecting the right anterior tibial and peroneal arteries.   She improved significantly with cilostazol and a walking program. Her symptoms currently not lifestyle limiting. She has been suffering from left hip pain which is thought to be due to bursitis over the last few months. This has limited her physical activities.   Allergies  Allergen Reactions  . Penicillins Hives  . Percocet [Oxycodone-Acetaminophen] Nausea And Vomiting     Current Outpatient Prescriptions on File Prior to Visit  Medication Sig Dispense Refill  . amLODipine (NORVASC) 10 MG tablet Take 10 mg by mouth daily.    [name] amLODipine (NORVASC) 10 MG tablet TAKE ONE (1) TABLET EACH DAY 90 tablet 3  . aspirin EC 81 MG tablet Take 1 tablet (81 mg total) by mouth daily.    [name] atorvastatin (LIPITOR) 20 MG tablet Take 20 mg by mouth every other day.    . cholecalciferol (VITAMIN D) 1000 UNITS tablet Take 1,000 Units by mouth daily.    . cilostazol (PLETAL) 100 MG tablet Take 1 tablet (100 mg total) by mouth 2 (two) times daily. 180 tablet  3  . glimepiride (AMARYL) 2 MG tablet Take 2 mg by mouth daily with breakfast.    . insulin NPH Human (HUMULIN N,NOVOLIN N) 100 UNIT/ML injection Inject 30-35 Units into the skin 2 (two) times daily before a meal. *per sliding scale*    . isosorbide dinitrate (ISORDIL) 20 MG tablet Take 1 tablet (20 mg total) by mouth 3 (three) times daily. 90 tablet 6  . labetalol (NORMODYNE) 300 MG tablet TAKE ONE TABLET TWICE DAILY 180 tablet 3  . levothyroxine (SYNTHROID, LEVOTHROID) 137 MCG tablet Take 137 mcg by mouth daily before breakfast.    . losartan (COZAAR) 100 MG tablet Take 100 mg by mouth daily.    [name] losartan (COZAAR) 100 MG tablet TAKE ONE (1) TABLET BY MOUTH EVERY DAY 90 tablet 3  . metFORMIN (GLUCOPHAGE) 500 MG tablet Take 500-1,000 mg by mouth 2 (two) times daily with a meal. Takes 1000mg  in the morning and 500mg  at night    . nitroGLYCERIN (NITROSTAT) 0.4 MG SL tablet Place 0.4 mg under the tongue every 5 (five) minutes as needed for chest pain.    . Omega-3 Fatty Acids (FISH OIL) 1000 MG CAPS Take 2,000 mg by mouth daily.     [name] rOPINIRole (REQUIP) 0.5 MG tablet Take 0.5 mg by mouth at bedtime.   0   No current facility-administered medications on file prior to visit.     Past Medical History  Diagnosis Date  . HTN (hypertension)   . DM (diabetes mellitus)   . Heart block   . Hypothyroidism   .  Ovarian tumor   . Lupus   . S/P colonoscopy 2005    Dr. Laural Golden: pancolonic divericula, polyp, path unknown currently  . Coronary artery disease   . UTI (urinary tract infection) 05/08/2013  . Superficial fungus infection of skin 06/29/2013     Past Surgical History  Procedure Laterality Date  . Cholecystectomy    . Colostomy      then reversed  . Partial hysterectomy      left ovaries, then ovaries removed later due tumors   . Oophorectomy      1980, nicked bowel, peritonitis, colostomy, now colostomy reversed   . Eye surgeries    . Cardiac catheterization  2008    Tiny OM-2 with  90% narrowing. Med tx.  . Nuclear med stress test  10/2011    Small area of mild ischemia inferoapically.  . Abdominal aortagram N/A 01/03/2014    Procedure: ABDOMINAL [name];  Surgeon: Wellington Hampshire, MD;  Location: [facility] CATH LAB;  Service: Cardiovascular;  Laterality: N/A;  . Lower extremity angiogram N/A 01/03/2014    Procedure: LOWER EXTREMITY ANGIOGRAM;  Surgeon: Wellington Hampshire, MD;  Location: [place] CATH LAB;  Service: Cardiovascular;  Laterality: N/A;     Family History  Problem Relation Age of Onset  . Heart disease Mother     deceased  . Heart disease Father     deceased, heart disease  . Colon cancer Neg Hx   . Diabetes Brother   . Heart disease Brother   . Thyroid disease Brother   . Heart disease Sister   . Heart disease Brother   . Thyroid disease Brother      History   Social History  . Marital Status: Married    Spouse Name: N/A  . Number of Children: N/A  . Years of Education: N/A   Occupational History  . Retired     Research officer, political party   Social History Main Topics  . Smoking status: Never Smoker   . Smokeless tobacco: Never Used  . Alcohol Use: No  . Drug Use: No  . Sexual Activity: Not Currently    Birth Control/ Protection: Surgical   Other Topics Concern  . Not on file   Social History Narrative     ROS A 10 point review of system was performed. It is negative other than that mentioned in the history of present illness.   PHYSICAL EXAM   BP 122/54 mmHg  Pulse 75  Ht 5\' 3"  (1.6 m)  Wt 149 lb (67.586 kg)  BMI 26.40 kg/m2 Constitutional: She is oriented to person, place, and time. She appears well-developed and well-nourished. No distress.  HENT: No nasal discharge.  Head: Normocephalic and atraumatic.  Eyes: Pupils are equal and round. No discharge.  Neck: Normal range of motion. Neck supple. No JVD present. No thyromegaly present.  Cardiovascular: Normal rate, regular rhythm, normal heart sounds. Exam reveals no gallop and no  friction rub. No murmur heard.  Pulmonary/Chest: Effort normal and breath sounds normal. No stridor. No respiratory distress. She has no wheezes. She has no rales. She exhibits no tenderness.  Abdominal: Soft. Bowel sounds are normal. She exhibits no distension. There is no tenderness. There is no rebound and no guarding.  Musculoskeletal: Normal range of motion. She exhibits no edema and no tenderness.  Neurological: She is alert and oriented to person, place, and time. Coordination normal.  Skin: Skin is warm and dry. No rash noted. She is not diaphoretic. No erythema. No pallor.  Psychiatric: She has a normal mood and affect. Her behavior is normal. Judgment and thought content normal.  Vascular: No carotid bruit. Radial: normal bilaterally, Femoral: normal bilaterally. DP: +2 on right and absent on left. PT: +1 on right and absent on left    ASSESSMENT AND PLAN

## 2014-11-06 NOTE — Patient Instructions (Signed)
Medication Instructions:  Your physician recommends that you continue on your current medications as directed. Please refer to the Current Medication list given to you today.  Labwork: No new orders.   Testing/Procedures: No new orders.   Follow-Up: Your physician wants you to follow-up in: 1 YEAR with Dr Arida.  You will receive a reminder letter in the mail two months in advance. If you don't receive a letter, please call our office to schedule the follow-up appointment.   Any Other Special Instructions Will Be Listed Below (If Applicable).   

## 2014-11-06 NOTE — Assessment & Plan Note (Signed)
Claudication overall improved with cilostazol and a walking program. Current hip pain seems to be due to bursitis and not PAD. There was no evidence of aortoiliac disease.

## 2014-11-29 ENCOUNTER — Other Ambulatory Visit: Payer: Self-pay | Admitting: Cardiovascular Disease

## 2014-11-29 ENCOUNTER — Encounter: Payer: Self-pay | Admitting: Cardiovascular Disease

## 2014-11-29 ENCOUNTER — Ambulatory Visit (INDEPENDENT_AMBULATORY_CARE_PROVIDER_SITE_OTHER): Payer: PPO | Admitting: Cardiovascular Disease

## 2014-11-29 ENCOUNTER — Ambulatory Visit (HOSPITAL_COMMUNITY)
Admission: RE | Admit: 2014-11-29 | Discharge: 2014-11-29 | Disposition: A | Discharge status: Home / Self Care | Payer: PPO | Source: Ambulatory Visit | Attending: Cardiovascular Disease | Admitting: Cardiovascular Disease

## 2014-11-29 ENCOUNTER — Other Ambulatory Visit (HOSPITAL_COMMUNITY)
Admission: RE | Admit: 2014-11-29 | Discharge: 2014-11-29 | Disposition: A | Discharge status: Home / Self Care | Payer: PPO | Source: Ambulatory Visit | Attending: Cardiovascular Disease | Admitting: Cardiovascular Disease

## 2014-11-29 VITALS — BP 132/64 | HR 72 | Ht 63.0 in | Wt 151.0 lb

## 2014-11-29 DIAGNOSIS — I25118 Atherosclerotic heart disease of native coronary artery with other forms of angina pectoris: Secondary | ICD-10-CM

## 2014-11-29 DIAGNOSIS — I209 Angina pectoris, unspecified: Secondary | ICD-10-CM

## 2014-11-29 DIAGNOSIS — Z136 Encounter for screening for cardiovascular disorders: Secondary | ICD-10-CM | POA: Diagnosis not present

## 2014-11-29 DIAGNOSIS — I739 Peripheral vascular disease, unspecified: Secondary | ICD-10-CM | POA: Diagnosis not present

## 2014-11-29 DIAGNOSIS — R778 Other specified abnormalities of plasma proteins: Secondary | ICD-10-CM

## 2014-11-29 DIAGNOSIS — R079 Chest pain, unspecified: Secondary | ICD-10-CM | POA: Diagnosis present

## 2014-11-29 DIAGNOSIS — Z01818 Encounter for other preprocedural examination: Secondary | ICD-10-CM

## 2014-11-29 DIAGNOSIS — I252 Old myocardial infarction: Secondary | ICD-10-CM | POA: Insufficient documentation

## 2014-11-29 DIAGNOSIS — Z79899 Other long term (current) drug therapy: Secondary | ICD-10-CM | POA: Diagnosis not present

## 2014-11-29 DIAGNOSIS — R7989 Other specified abnormal findings of blood chemistry: Secondary | ICD-10-CM

## 2014-11-29 DIAGNOSIS — E785 Hyperlipidemia, unspecified: Secondary | ICD-10-CM | POA: Diagnosis not present

## 2014-11-29 DIAGNOSIS — M7989 Other specified soft tissue disorders: Secondary | ICD-10-CM

## 2014-11-29 DIAGNOSIS — I10 Essential (primary) hypertension: Secondary | ICD-10-CM | POA: Diagnosis not present

## 2014-11-29 DIAGNOSIS — R072 Precordial pain: Secondary | ICD-10-CM | POA: Diagnosis not present

## 2014-11-29 DIAGNOSIS — Z7982 Long term (current) use of aspirin: Secondary | ICD-10-CM | POA: Diagnosis not present

## 2014-11-29 DIAGNOSIS — E119 Type 2 diabetes mellitus without complications: Secondary | ICD-10-CM | POA: Insufficient documentation

## 2014-11-29 DIAGNOSIS — I2510 Atherosclerotic heart disease of native coronary artery without angina pectoris: Secondary | ICD-10-CM | POA: Diagnosis not present

## 2014-11-29 LAB — TROPONIN I: TROPONIN I: 0.04 ng/mL — AB (ref <0.031)

## 2014-11-29 MED ORDER — ISOSORBIDE DINITRATE 30 MG PO TABS: 30.0000 mg | ORAL_TABLET | Freq: Three times a day (TID) | ORAL | Status: DC

## 2014-11-29 NOTE — Progress Notes (Addendum)
Patient ID: Virginia Huber, female   DOB: February 07, 1946, 69 y.o.   MRN: [id number]      SUBJECTIVE: The patient is here to followup for coronary artery disease, hypertension, PVD, and hyperlipidemia. Prior echocardiogram demonstrated normal left ventricular systolic function, EF 123456, severe LVH, and grade 2 diastolic dysfunction.   She has PVD and follows with Dr. Fletcher Anon whom she last saw in May.   Last Saturday and Sunday, she experienced significant chest pain and throat pain with any type of exertion including walking and going to the bathroom. Her heart rate was 104-110 bpm. Since that time she has not had as much chest pain but she still doesn't feel quite well.  She has also had mild leg and ankle swelling which is unusual for her.  ECG performed in the office today demonstrates sinus rhythm with a nonspecific ST segment abnormality and T-wave inversions in high lateral leads and V6, not markedly different from an ECG performed in August 2015.   She had a normal nuclear stress test on 08/17/13. Echocardiogram on 08/11/13 demonstrated normal left ventricular systolic function and regional wall motion with grade 2 diastolic dysfunction.  Soc: She and her husband celebrated their 29th wedding anniversary last year by going on an [place] cruise.   Review of Systems: As per "subjective", otherwise negative.  Allergies  Allergen Reactions  . Penicillins Hives  . Percocet [Oxycodone-Acetaminophen] Nausea And Vomiting    Current Outpatient Prescriptions  Medication Sig Dispense Refill  . amLODipine (NORVASC) 10 MG tablet TAKE ONE (1) TABLET EACH DAY 90 tablet 3  . aspirin EC 81 MG tablet Take 1 tablet (81 mg total) by mouth daily.    [name] atorvastatin (LIPITOR) 20 MG tablet Take 20 mg by mouth every other day.    . cholecalciferol (VITAMIN D) 1000 UNITS tablet Take 1,000 Units by mouth daily.    . cilostazol (PLETAL) 100 MG tablet Take 1 tablet (100 mg total) by mouth 2 (two) times daily.  180 tablet 3  . glimepiride (AMARYL) 2 MG tablet Take 2 mg by mouth daily with breakfast.    . insulin NPH Human (HUMULIN N,NOVOLIN N) 100 UNIT/ML injection Inject 30-35 Units into the skin 2 (two) times daily before a meal. *per sliding scale*    . isosorbide dinitrate (ISORDIL) 20 MG tablet Take 1 tablet (20 mg total) by mouth 3 (three) times daily. 90 tablet 6  . labetalol (NORMODYNE) 300 MG tablet TAKE ONE TABLET TWICE DAILY 180 tablet 3  . levothyroxine (SYNTHROID, LEVOTHROID) 137 MCG tablet Take 137 mcg by mouth daily before breakfast.    . losartan (COZAAR) 100 MG tablet TAKE ONE (1) TABLET BY MOUTH EVERY DAY 90 tablet 3  . metFORMIN (GLUCOPHAGE) 500 MG tablet Take 500-1,000 mg by mouth 2 (two) times daily with a meal. Takes 1000mg  in the morning and 500mg  at night    . nitroGLYCERIN (NITROSTAT) 0.4 MG SL tablet Place 0.4 mg under the tongue every 5 (five) minutes as needed for chest pain.    . Omega-3 Fatty Acids (FISH OIL) 1000 MG CAPS Take 2,000 mg by mouth daily.     [name] rOPINIRole (REQUIP) 0.5 MG tablet Take 0.5 mg by mouth at bedtime.   0   No current facility-administered medications for this visit.    Past Medical History  Diagnosis Date  . HTN (hypertension)   . DM (diabetes mellitus)   . Heart block   . Hypothyroidism   . Ovarian tumor   .  Lupus   . S/P colonoscopy 2005    Dr. Laural Golden: pancolonic divericula, polyp, path unknown currently  . Coronary artery disease   . UTI (urinary tract infection) 05/08/2013  . Superficial fungus infection of skin 06/29/2013    Past Surgical History  Procedure Laterality Date  . Cholecystectomy    . Colostomy      then reversed  . Partial hysterectomy      left ovaries, then ovaries removed later due tumors   . Oophorectomy      1980, nicked bowel, peritonitis, colostomy, now colostomy reversed   . Eye surgeries    . Cardiac catheterization  2008    Tiny OM-2 with 90% narrowing. Med tx.  . Nuclear med stress test  10/2011     Small area of mild ischemia inferoapically.  . Abdominal aortagram N/A 01/03/2014    Procedure: ABDOMINAL [name];  Surgeon: Wellington Hampshire, MD;  Location: [facility] CATH LAB;  Service: Cardiovascular;  Laterality: N/A;  . Lower extremity angiogram N/A 01/03/2014    Procedure: LOWER EXTREMITY ANGIOGRAM;  Surgeon: Wellington Hampshire, MD;  Location: [facility] CATH LAB;  Service: Cardiovascular;  Laterality: N/A;    History   Social History  . Marital Status: Married    Spouse Name: N/A  . Number of Children: N/A  . Years of Education: N/A   Occupational History  . Retired     Research officer, political party   Social History Main Topics  . Smoking status: Never Smoker   . Smokeless tobacco: Never Used  . Alcohol Use: No  . Drug Use: No  . Sexual Activity: Not Currently    Birth Control/ Protection: Surgical   Other Topics Concern  . Not on file   Social History Narrative     Filed Vitals:   11/29/14 0851  BP: 132/64  Pulse: 72  Height: 5\' 3"  (1.6 m)  Weight: 151 lb (68.493 kg)  SpO2: 98%    PHYSICAL EXAM General: NAD HEENT: Normal. Neck: No JVD, no thyromegaly. Lungs: Clear to auscultation bilaterally with normal respiratory effort. CV: Nondisplaced PMI.  Regular rate and rhythm, normal S1/S2, no S3/S4, no murmur. Trace pretibial and periankle edema.  Abdomen: Soft, nontender, no hepatosplenomegaly, no distention.  Neurologic: Alert and oriented x 3.  Psych: Normal affect. Skin: Normal. Musculoskeletal: Normal range of motion, no gross deformities. Extremities: No clubbing or cyanosis.   ECG: Most recent ECG reviewed.      ASSESSMENT AND PLAN: 1. Chest pain in the context of CAD and h/o MI: More recent episodes consistent with accelerated angina. I will increase isosorbide dinitrate to 30 mg tid. No ischemia on prior nuclear MPI in 07/2013. Will check serum troponin and obtain echocardiogram. If symptoms recur or there is a change in LV systolic function and/or regional wall motion, I  would proceed with coronary angiography.   2. Essential HTN: Controlled on present therapy. Continue labetalol 300 mg bid.  3. Hyperlipidemia: Continue Lipitor 20 mg daily and fish oil.  4. Daytime somnolence and fatigue: She was scheduled by me to have a sleep study but did not have it last year.  5. PVD: Continue Pletal. Saw Dr. Fletcher Anon last month.  6. Leg/ankle swelling: Obtain echocardiogram to assess for interval change in LV function.  Dispo: f/u 2 weeks.  Time spent: 40 minutes, of which greater than 50% was spent reviewing symptoms, relevant blood tests and studies, and discussing management plan with the patient.   Kate Sable, M.D., F.A.C.C.  ADDENDUM: Troponin minimally  elevated at 0.04, thus unclear if this represents a downward trend from last weekend when symptoms were most severe. She still does not feel entirely well with continued chest tightness. Will arrange for coronary angiography within next 24-36 hours.

## 2014-11-29 NOTE — Addendum Note (Signed)
Addended by: Kate Sable A on: 11/29/2014 10:44 AM   Modules accepted: Level of Service

## 2014-11-29 NOTE — Patient Instructions (Signed)
Your physician recommends that you schedule a follow-up appointment in: 2 weeks with Dr Bronson Ing  Your physician has requested that you have an echocardiogram. Echocardiography is a painless test that uses sound waves to create images of your heart. It provides your doctor with information about the size and shape of your heart and how well your heart's chambers and valves are working. This procedure takes approximately one hour. There are no restrictions for this procedure.   INCREASE Isordil to 30 mg three times a day    Thank you for choosing [facility] !

## 2014-11-29 NOTE — Addendum Note (Signed)
Addended by: Barbarann Ehlers A on: 11/29/2014 10:59 AM   Modules accepted: Orders, Level of Service

## 2014-11-30 ENCOUNTER — Encounter (HOSPITAL_COMMUNITY): Payer: Self-pay | Admitting: Cardiovascular Disease

## 2014-11-30 ENCOUNTER — Encounter (HOSPITAL_COMMUNITY)
Admission: RE | Disposition: A | Discharge status: Home / Self Care | Payer: PPO | Source: Ambulatory Visit | Attending: Cardiovascular Disease

## 2014-11-30 ENCOUNTER — Ambulatory Visit (HOSPITAL_COMMUNITY)
Admission: RE | Admit: 2014-11-30 | Discharge: 2014-12-01 | Disposition: A | Discharge status: Home / Self Care | Payer: PPO | Source: Ambulatory Visit | Attending: Cardiovascular Disease | Admitting: Cardiovascular Disease

## 2014-11-30 ENCOUNTER — Ambulatory Visit (HOSPITAL_COMMUNITY): Admission: RE | Admit: 2014-11-30 | Payer: PPO | Source: Ambulatory Visit

## 2014-11-30 DIAGNOSIS — Z794 Long term (current) use of insulin: Secondary | ICD-10-CM | POA: Diagnosis not present

## 2014-11-30 DIAGNOSIS — I252 Old myocardial infarction: Secondary | ICD-10-CM | POA: Insufficient documentation

## 2014-11-30 DIAGNOSIS — I25110 Atherosclerotic heart disease of native coronary artery with unstable angina pectoris: Secondary | ICD-10-CM | POA: Insufficient documentation

## 2014-11-30 DIAGNOSIS — M25472 Effusion, left ankle: Secondary | ICD-10-CM | POA: Diagnosis not present

## 2014-11-30 DIAGNOSIS — I739 Peripheral vascular disease, unspecified: Secondary | ICD-10-CM | POA: Diagnosis not present

## 2014-11-30 DIAGNOSIS — I2510 Atherosclerotic heart disease of native coronary artery without angina pectoris: Secondary | ICD-10-CM | POA: Diagnosis present

## 2014-11-30 DIAGNOSIS — E785 Hyperlipidemia, unspecified: Secondary | ICD-10-CM | POA: Diagnosis not present

## 2014-11-30 DIAGNOSIS — E119 Type 2 diabetes mellitus without complications: Secondary | ICD-10-CM | POA: Diagnosis not present

## 2014-11-30 DIAGNOSIS — E1165 Type 2 diabetes mellitus with hyperglycemia: Secondary | ICD-10-CM

## 2014-11-30 DIAGNOSIS — I10 Essential (primary) hypertension: Secondary | ICD-10-CM | POA: Insufficient documentation

## 2014-11-30 DIAGNOSIS — Z7982 Long term (current) use of aspirin: Secondary | ICD-10-CM | POA: Diagnosis not present

## 2014-11-30 DIAGNOSIS — E039 Hypothyroidism, unspecified: Secondary | ICD-10-CM | POA: Insufficient documentation

## 2014-11-30 DIAGNOSIS — R7989 Other specified abnormal findings of blood chemistry: Secondary | ICD-10-CM | POA: Diagnosis present

## 2014-11-30 DIAGNOSIS — I200 Unstable angina: Secondary | ICD-10-CM | POA: Diagnosis present

## 2014-11-30 DIAGNOSIS — Z9861 Coronary angioplasty status: Secondary | ICD-10-CM

## 2014-11-30 DIAGNOSIS — I25118 Atherosclerotic heart disease of native coronary artery with other forms of angina pectoris: Secondary | ICD-10-CM | POA: Diagnosis present

## 2014-11-30 DIAGNOSIS — I209 Angina pectoris, unspecified: Secondary | ICD-10-CM

## 2014-11-30 DIAGNOSIS — IMO0002 Reserved for concepts with insufficient information to code with codable children: Secondary | ICD-10-CM

## 2014-11-30 DIAGNOSIS — R778 Other specified abnormalities of plasma proteins: Secondary | ICD-10-CM | POA: Diagnosis present

## 2014-11-30 HISTORY — DX: Gastro-esophageal reflux disease without esophagitis: K21.9

## 2014-11-30 HISTORY — DX: Transient cerebral ischemic attack, unspecified: G45.9

## 2014-11-30 HISTORY — DX: Unspecified osteoarthritis, unspecified site: M19.90

## 2014-11-30 HISTORY — PX: CARDIAC CATHETERIZATION: SHX172

## 2014-11-30 HISTORY — DX: Atherosclerotic heart disease of native coronary artery without angina pectoris: I25.10

## 2014-11-30 HISTORY — DX: Acute myocardial infarction, unspecified: I21.9

## 2014-11-30 LAB — PROTIME-INR
INR: 0.99 (ref 0.00–1.49)
Prothrombin Time: 13.3 seconds (ref 11.6–15.2)

## 2014-11-30 LAB — BASIC METABOLIC PANEL
Anion gap: 11 (ref 5–15)
BUN: 13 mg/dL (ref 6–20)
CALCIUM: 9.3 mg/dL (ref 8.9–10.3)
CHLORIDE: 104 mmol/L (ref 101–111)
CO2: 22 mmol/L (ref 22–32)
Creatinine, Ser: 0.9 mg/dL (ref 0.44–1.00)
GFR calc Af Amer: >60 mL/min (ref >60)
Glucose, Bld: 251 mg/dL — ABNORMAL HIGH (ref 65–99)
Potassium: 4.1 mmol/L (ref 3.5–5.1)
SODIUM: 137 mmol/L (ref 135–145)

## 2014-11-30 LAB — CBC
HCT: 39 % (ref 36.0–46.0)
HEMOGLOBIN: 12.9 g/dL (ref 12.0–15.0)
MCH: 29.4 pg (ref 26.0–34.0)
MCHC: 33.1 g/dL (ref 30.0–36.0)
MCV: 88.8 fL (ref 78.0–100.0)
Platelets: 271 10*3/uL (ref 150–400)
RBC: 4.39 MIL/uL (ref 3.87–5.11)
RDW: 13.6 % (ref 11.5–15.5)
WBC: 9.8 10*3/uL (ref 4.0–10.5)

## 2014-11-30 LAB — GLUCOSE, CAPILLARY
GLUCOSE-CAPILLARY: 189 mg/dL — AB (ref 65–99)
GLUCOSE-CAPILLARY: 201 mg/dL — AB (ref 65–99)
Glucose-Capillary: 216 mg/dL — ABNORMAL HIGH (ref 65–99)
Glucose-Capillary: 278 mg/dL — ABNORMAL HIGH (ref 65–99)

## 2014-11-30 LAB — POCT ACTIVATED CLOTTING TIME: ACTIVATED CLOTTING TIME: 589 s

## 2014-11-30 SURGERY — LEFT HEART CATH AND CORONARY ANGIOGRAPHY

## 2014-11-30 MED ORDER — SODIUM CHLORIDE 0.9 % WEIGHT BASED INFUSION
3.0000 mL/kg/h | INTRAVENOUS | Status: DC
  Administered 2014-11-30: 3 mL/kg/h via INTRAVENOUS

## 2014-11-30 MED ORDER — ASPIRIN 81 MG PO CHEW: 81.0000 mg | CHEWABLE_TABLET | ORAL | Status: DC

## 2014-11-30 MED ORDER — NITROGLYCERIN 1 MG/10 ML FOR IR/CATH LAB
INTRA_ARTERIAL | Status: AC
  Filled 2014-11-30: qty 10

## 2014-11-30 MED ORDER — AMLODIPINE BESYLATE 10 MG PO TABS
10.0000 mg | ORAL_TABLET | Freq: Every day | ORAL | Status: DC
  Administered 2014-12-01: 08:00:00 10 mg via ORAL
  Filled 2014-11-30: qty 1

## 2014-11-30 MED ORDER — ASPIRIN EC 81 MG PO TBEC
81.0000 mg | DELAYED_RELEASE_TABLET | Freq: Every day | ORAL | Status: DC
  Administered 2014-12-01: 81 mg via ORAL
  Filled 2014-11-30: qty 1

## 2014-11-30 MED ORDER — LOSARTAN POTASSIUM 50 MG PO TABS
100.0000 mg | ORAL_TABLET | Freq: Every day | ORAL | Status: DC
  Administered 2014-12-01: 100 mg via ORAL
  Filled 2014-11-30: qty 2

## 2014-11-30 MED ORDER — HEPARIN (PORCINE) IN NACL 2-0.9 UNIT/ML-% IJ SOLN
INTRAMUSCULAR | Status: AC
  Filled 2014-11-30: qty 1500

## 2014-11-30 MED ORDER — TICAGRELOR 90 MG PO TABS
ORAL_TABLET | ORAL | Status: AC
  Filled 2014-11-30: qty 1

## 2014-11-30 MED ORDER — LIDOCAINE HCL (PF) 1 % IJ SOLN
INTRAMUSCULAR | Status: DC | PRN
  Administered 2014-11-30: 2 mL

## 2014-11-30 MED ORDER — SODIUM CHLORIDE 0.9 % IJ SOLN: 3.0000 mL | Freq: Two times a day (BID) | INTRAMUSCULAR | Status: DC

## 2014-11-30 MED ORDER — LIDOCAINE HCL (PF) 1 % IJ SOLN
INTRAMUSCULAR | Status: AC
  Filled 2014-11-30: qty 30

## 2014-11-30 MED ORDER — NITROGLYCERIN 1 MG/10 ML FOR IR/CATH LAB
INTRA_ARTERIAL | Status: DC | PRN
  Administered 2014-11-30: 100 ug
  Administered 2014-11-30: 200 ug

## 2014-11-30 MED ORDER — SODIUM CHLORIDE 0.9 % IJ SOLN: 3.0000 mL | INTRAMUSCULAR | Status: DC | PRN

## 2014-11-30 MED ORDER — HEPARIN SODIUM (PORCINE) 1000 UNIT/ML IJ SOLN
INTRAMUSCULAR | Status: DC | PRN
  Administered 2014-11-30: 3400 [IU] via INTRAVENOUS

## 2014-11-30 MED ORDER — SODIUM CHLORIDE 0.9 % IV SOLN
250.0000 mg | INTRAVENOUS | Status: DC | PRN
  Administered 2014-11-30: 1.75 mg/kg/h via INTRAVENOUS

## 2014-11-30 MED ORDER — IOHEXOL 350 MG/ML SOLN
INTRAVENOUS | Status: DC | PRN
  Administered 2014-11-30: 100 mL via INTRAVENOUS
  Administered 2014-11-30: 50 mL via INTRAVENOUS
  Administered 2014-11-30: 100 mL via INTRAVENOUS

## 2014-11-30 MED ORDER — HEPARIN SODIUM (PORCINE) 1000 UNIT/ML IJ SOLN
INTRAMUSCULAR | Status: AC
  Filled 2014-11-30: qty 1

## 2014-11-30 MED ORDER — SODIUM CHLORIDE 0.9 % IV SOLN
INTRAVENOUS | Status: DC | PRN
  Administered 2014-11-30: 200 mL/h via INTRAVENOUS

## 2014-11-30 MED ORDER — FENTANYL CITRATE (PF) 100 MCG/2ML IJ SOLN
INTRAMUSCULAR | Status: DC | PRN
  Administered 2014-11-30 (×3): 25 ug via INTRAVENOUS

## 2014-11-30 MED ORDER — BIVALIRUDIN 250 MG IV SOLR
INTRAVENOUS | Status: AC
  Filled 2014-11-30: qty 250

## 2014-11-30 MED ORDER — MIDAZOLAM HCL 2 MG/2ML IJ SOLN
INTRAMUSCULAR | Status: DC | PRN
  Administered 2014-11-30 (×3): 1 mg via INTRAVENOUS

## 2014-11-30 MED ORDER — FENTANYL CITRATE (PF) 100 MCG/2ML IJ SOLN
INTRAMUSCULAR | Status: AC
Start: 2014-11-30 — End: 2014-11-30
  Filled 2014-11-30: qty 2

## 2014-11-30 MED ORDER — ATORVASTATIN CALCIUM 80 MG PO TABS: 80.0000 mg | ORAL_TABLET | Freq: Every day | ORAL | Status: DC

## 2014-11-30 MED ORDER — INSULIN NPH (HUMAN) (ISOPHANE) 100 UNIT/ML ~~LOC~~ SUSP
25.0000 [IU] | Freq: Once | SUBCUTANEOUS | Status: AC
  Administered 2014-11-30: 22:00:00 25 [IU] via SUBCUTANEOUS
  Filled 2014-11-30: qty 10

## 2014-11-30 MED ORDER — SODIUM CHLORIDE 0.9 % IV SOLN: 250.0000 mL | INTRAVENOUS | Status: DC | PRN

## 2014-11-30 MED ORDER — VERAPAMIL HCL 2.5 MG/ML IV SOLN
INTRAVENOUS | Status: AC
  Filled 2014-11-30: qty 2

## 2014-11-30 MED ORDER — VERAPAMIL HCL 2.5 MG/ML IV SOLN
INTRAVENOUS | Status: DC | PRN
  Administered 2014-11-30: 09:00:00 via INTRA_ARTERIAL

## 2014-11-30 MED ORDER — ONDANSETRON HCL 4 MG/2ML IJ SOLN: 4.0000 mg | Freq: Four times a day (QID) | INTRAMUSCULAR | Status: DC | PRN

## 2014-11-30 MED ORDER — INSULIN ASPART 100 UNIT/ML ~~LOC~~ SOLN
0.0000 [IU] | Freq: Three times a day (TID) | SUBCUTANEOUS | Status: DC
  Administered 2014-11-30: 13:00:00 2 [IU] via SUBCUTANEOUS
  Administered 2014-11-30 – 2014-12-01 (×2): 3 [IU] via SUBCUTANEOUS

## 2014-11-30 MED ORDER — SODIUM CHLORIDE 0.9 % IV SOLN
INTRAVENOUS | Status: AC
  Administered 2014-11-30: 11:00:00 via INTRAVENOUS

## 2014-11-30 MED ORDER — MIDAZOLAM HCL 2 MG/2ML IJ SOLN
INTRAMUSCULAR | Status: AC
  Filled 2014-11-30: qty 2

## 2014-11-30 MED ORDER — LABETALOL HCL 300 MG PO TABS
300.0000 mg | ORAL_TABLET | Freq: Two times a day (BID) | ORAL | Status: DC
  Administered 2014-11-30 – 2014-12-01 (×2): 300 mg via ORAL
  Filled 2014-11-30 (×5): qty 1

## 2014-11-30 MED ORDER — BIVALIRUDIN BOLUS VIA INFUSION - CUPID
INTRAVENOUS | Status: DC | PRN
  Administered 2014-11-30: 51.375 mg via INTRAVENOUS

## 2014-11-30 MED ORDER — TICAGRELOR 90 MG PO TABS
ORAL_TABLET | ORAL | Status: DC | PRN
Start: 2014-11-30 — End: 2014-11-30
  Administered 2014-11-30: 180 mg via ORAL

## 2014-11-30 MED ORDER — SODIUM CHLORIDE 0.9 % WEIGHT BASED INFUSION: 1.0000 mL/kg/h | INTRAVENOUS | Status: DC

## 2014-11-30 MED ORDER — TICAGRELOR 90 MG PO TABS
90.0000 mg | ORAL_TABLET | Freq: Two times a day (BID) | ORAL | Status: DC
  Administered 2014-11-30 – 2014-12-01 (×3): 90 mg via ORAL
  Filled 2014-11-30 (×2): qty 1

## 2014-11-30 SURGICAL SUPPLY — 19 items
BALLN ANGIOSCULPT RX 2.0X10 (BALLOONS) ×3
BALLN MINITREK RX 2.0X12 (BALLOONS) ×3
BALLOON ANGIOSCULPT RX 2.0X10 (BALLOONS) IMPLANT
BALLOON MINITREK RX 2.0X12 (BALLOONS) IMPLANT
CATH INFINITI 5 FR JL3.5 (CATHETERS) ×2 IMPLANT
CATH INFINITI 5FR ANG PIGTAIL (CATHETERS) ×3 IMPLANT
CATH OPTITORQUE TIG 4.0 5F (CATHETERS) ×3 IMPLANT
CATH VISTA GUIDE 6FR XBLAD3.5 (CATHETERS) ×2 IMPLANT
DEVICE RAD COMP TR BAND LRG (VASCULAR PRODUCTS) ×3 IMPLANT
GLIDESHEATH SLEND A-KIT 6F 22G (SHEATH) ×3 IMPLANT
KIT ENCORE 26 ADVANTAGE (KITS) ×2 IMPLANT
KIT HEART LEFT (KITS) ×3 IMPLANT
PACK CARDIAC CATHETERIZATION (CUSTOM PROCEDURE TRAY) ×3 IMPLANT
SYR MEDRAD MARK V 150ML (SYRINGE) ×3 IMPLANT
TRANSDUCER W/STOPCOCK (MISCELLANEOUS) ×3 IMPLANT
TUBING CIL FLEX 10 FLL-RA (TUBING) ×3 IMPLANT
WIRE ASAHI PROWATER 180CM (WIRE) ×2 IMPLANT
WIRE HI TORQ VERSACORE-J 145CM (WIRE) ×2 IMPLANT
WIRE SAFE-T 1.5MM-J .035X260CM (WIRE) ×3 IMPLANT

## 2014-11-30 NOTE — H&P (View-Only) (Signed)
Patient ID: Virginia Huber, female   DOB: 1945-10-28, 69 y.o.   MRN: [id number]      SUBJECTIVE: The patient is here to followup for coronary artery disease, hypertension, PVD, and hyperlipidemia. Prior echocardiogram demonstrated normal left ventricular systolic function, EF 123456, severe LVH, and grade 2 diastolic dysfunction.   She has PVD and follows with Dr. Fletcher Anon whom she last saw in May.   Last Saturday and Sunday, she experienced significant chest pain and throat pain with any type of exertion including walking and going to the bathroom. Her heart rate was 104-110 bpm. Since that time she has not had as much chest pain but she still doesn't feel quite well.  She has also had mild leg and ankle swelling which is unusual for her.  ECG performed in the office today demonstrates sinus rhythm with a nonspecific ST segment abnormality and T-wave inversions in high lateral leads and V6, not markedly different from an ECG performed in August 2015.   She had a normal nuclear stress test on 08/17/13. Echocardiogram on 08/11/13 demonstrated normal left ventricular systolic function and regional wall motion with grade 2 diastolic dysfunction.  Soc: She and her husband celebrated their 39th wedding anniversary last year by going on an [place] cruise.   Review of Systems: As per "subjective", otherwise negative.  Allergies  Allergen Reactions  . Penicillins Hives  . Percocet [Oxycodone-Acetaminophen] Nausea And Vomiting    Current Outpatient Prescriptions  Medication Sig Dispense Refill  . amLODipine (NORVASC) 10 MG tablet TAKE ONE (1) TABLET EACH DAY 90 tablet 3  . aspirin EC 81 MG tablet Take 1 tablet (81 mg total) by mouth daily.    [name] atorvastatin (LIPITOR) 20 MG tablet Take 20 mg by mouth every other day.    . cholecalciferol (VITAMIN D) 1000 UNITS tablet Take 1,000 Units by mouth daily.    . cilostazol (PLETAL) 100 MG tablet Take 1 tablet (100 mg total) by mouth 2 (two) times daily.  180 tablet 3  . glimepiride (AMARYL) 2 MG tablet Take 2 mg by mouth daily with breakfast.    . insulin NPH Human (HUMULIN N,NOVOLIN N) 100 UNIT/ML injection Inject 30-35 Units into the skin 2 (two) times daily before a meal. *per sliding scale*    . isosorbide dinitrate (ISORDIL) 20 MG tablet Take 1 tablet (20 mg total) by mouth 3 (three) times daily. 90 tablet 6  . labetalol (NORMODYNE) 300 MG tablet TAKE ONE TABLET TWICE DAILY 180 tablet 3  . levothyroxine (SYNTHROID, LEVOTHROID) 137 MCG tablet Take 137 mcg by mouth daily before breakfast.    . losartan (COZAAR) 100 MG tablet TAKE ONE (1) TABLET BY MOUTH EVERY DAY 90 tablet 3  . metFORMIN (GLUCOPHAGE) 500 MG tablet Take 500-1,000 mg by mouth 2 (two) times daily with a meal. Takes 1000mg  in the morning and 500mg  at night    . nitroGLYCERIN (NITROSTAT) 0.4 MG SL tablet Place 0.4 mg under the tongue every 5 (five) minutes as needed for chest pain.    . Omega-3 Fatty Acids (FISH OIL) 1000 MG CAPS Take 2,000 mg by mouth daily.     [name] rOPINIRole (REQUIP) 0.5 MG tablet Take 0.5 mg by mouth at bedtime.   0   No current facility-administered medications for this visit.    Past Medical History  Diagnosis Date  . HTN (hypertension)   . DM (diabetes mellitus)   . Heart block   . Hypothyroidism   . Ovarian tumor   .  Lupus   . S/P colonoscopy 2005    Dr. Laural Golden: pancolonic divericula, polyp, path unknown currently  . Coronary artery disease   . UTI (urinary tract infection) 05/08/2013  . Superficial fungus infection of skin 06/29/2013    Past Surgical History  Procedure Laterality Date  . Cholecystectomy    . Colostomy      then reversed  . Partial hysterectomy      left ovaries, then ovaries removed later due tumors   . Oophorectomy      1980, nicked bowel, peritonitis, colostomy, now colostomy reversed   . Eye surgeries    . Cardiac catheterization  2008    Tiny OM-2 with 90% narrowing. Med tx.  . Nuclear med stress test  10/2011     Small area of mild ischemia inferoapically.  . Abdominal aortagram N/A 01/03/2014    Procedure: ABDOMINAL [name];  Surgeon: Wellington Hampshire, MD;  Location: [facility] CATH LAB;  Service: Cardiovascular;  Laterality: N/A;  . Lower extremity angiogram N/A 01/03/2014    Procedure: LOWER EXTREMITY ANGIOGRAM;  Surgeon: Wellington Hampshire, MD;  Location: [facility] CATH LAB;  Service: Cardiovascular;  Laterality: N/A;    History   Social History  . Marital Status: Married    Spouse Name: N/A  . Number of Children: N/A  . Years of Education: N/A   Occupational History  . Retired     Research officer, political party   Social History Main Topics  . Smoking status: Never Smoker   . Smokeless tobacco: Never Used  . Alcohol Use: No  . Drug Use: No  . Sexual Activity: Not Currently    Birth Control/ Protection: Surgical   Other Topics Concern  . Not on file   Social History Narrative     Filed Vitals:   11/29/14 0851  BP: 132/64  Pulse: 72  Height: 5\' 3"  (1.6 m)  Weight: 151 lb (68.493 kg)  SpO2: 98%    PHYSICAL EXAM General: NAD HEENT: Normal. Neck: No JVD, no thyromegaly. Lungs: Clear to auscultation bilaterally with normal respiratory effort. CV: Nondisplaced PMI.  Regular rate and rhythm, normal S1/S2, no S3/S4, no murmur. Trace pretibial and periankle edema.  Abdomen: Soft, nontender, no hepatosplenomegaly, no distention.  Neurologic: Alert and oriented x 3.  Psych: Normal affect. Skin: Normal. Musculoskeletal: Normal range of motion, no gross deformities. Extremities: No clubbing or cyanosis.   ECG: Most recent ECG reviewed.      ASSESSMENT AND PLAN: 1. Chest pain in the context of CAD and h/o MI: More recent episodes consistent with accelerated angina. I will increase isosorbide dinitrate to 30 mg tid. No ischemia on prior nuclear MPI in 07/2013. Will check serum troponin and obtain echocardiogram. If symptoms recur or there is a change in LV systolic function and/or regional wall motion, I  would proceed with coronary angiography.   2. Essential HTN: Controlled on present therapy. Continue labetalol 300 mg bid.  3. Hyperlipidemia: Continue Lipitor 20 mg daily and fish oil.  4. Daytime somnolence and fatigue: She was scheduled by me to have a sleep study but did not have it last year.  5. PVD: Continue Pletal. Saw Dr. Fletcher Anon last month.  6. Leg/ankle swelling: Obtain echocardiogram to assess for interval change in LV function.  Dispo: f/u 2 weeks.  Time spent: 40 minutes, of which greater than 50% was spent reviewing symptoms, relevant blood tests and studies, and discussing management plan with the patient.   Kate Sable, M.D., F.A.C.C.  ADDENDUM: Troponin minimally  elevated at 0.04, thus unclear if this represents a downward trend from last weekend when symptoms were most severe. She still does not feel entirely well with continued chest tightness. Will arrange for coronary angiography within next 24-36 hours.

## 2014-11-30 NOTE — Interval H&P Note (Signed)
Cath Lab Visit (complete for each Cath Lab visit)  Clinical Evaluation Leading to the Procedure:   ACS: No.  Non-ACS:    Anginal Classification: CCS IV  Anti-ischemic medical therapy: Maximal Therapy (2 or more classes of medications)  Non-Invasive Test Results: No non-invasive testing performed  Prior CABG: No previous CABG      History and Physical Interval Note:  11/30/2014 8:44 AM  Virginia Huber  has presented today for surgery, with the diagnosis of cp  The various methods of treatment have been discussed with the patient and family. After consideration of risks, benefits and other options for treatment, the patient has consented to  Procedure(s): Left Heart Cath and Coronary Angiography (N/A) as a surgical intervention .  The patient's history has been reviewed, patient examined, no change in status, stable for surgery.  I have reviewed the patient's chart and labs.  Questions were answered to the patient's satisfaction.     Algie Westry A

## 2014-11-30 NOTE — Progress Notes (Signed)
TR BAND REMOVAL  LOCATION:  right radial  DEFLATED PER PROTOCOL:  Yes.    TIME BAND OFF / DRESSING APPLIED:   1600   SITE UPON ARRIVAL:   Level 0  SITE AFTER BAND REMOVAL:  Level 0  REVERSE ALLEN'S TEST:    positive  CIRCULATION SENSATION AND MOVEMENT:  Within Normal Limits  Yes.    COMMENTS:

## 2014-12-01 ENCOUNTER — Encounter (HOSPITAL_COMMUNITY): Payer: Self-pay | Admitting: Physician Assistant

## 2014-12-01 DIAGNOSIS — E785 Hyperlipidemia, unspecified: Secondary | ICD-10-CM | POA: Diagnosis not present

## 2014-12-01 DIAGNOSIS — I25110 Atherosclerotic heart disease of native coronary artery with unstable angina pectoris: Secondary | ICD-10-CM | POA: Diagnosis not present

## 2014-12-01 DIAGNOSIS — I10 Essential (primary) hypertension: Secondary | ICD-10-CM | POA: Diagnosis not present

## 2014-12-01 DIAGNOSIS — I739 Peripheral vascular disease, unspecified: Secondary | ICD-10-CM | POA: Diagnosis not present

## 2014-12-01 LAB — BASIC METABOLIC PANEL
Anion gap: 9 (ref 5–15)
BUN: 12 mg/dL (ref 6–20)
CHLORIDE: 105 mmol/L (ref 101–111)
CO2: 24 mmol/L (ref 22–32)
Calcium: 8.9 mg/dL (ref 8.9–10.3)
Creatinine, Ser: 0.84 mg/dL (ref 0.44–1.00)
GFR calc Af Amer: >60 mL/min (ref >60)
GFR calc non Af Amer: >60 mL/min (ref >60)
Glucose, Bld: 231 mg/dL — ABNORMAL HIGH (ref 65–99)
Potassium: 3.7 mmol/L (ref 3.5–5.1)
Sodium: 138 mmol/L (ref 135–145)

## 2014-12-01 LAB — CBC
HCT: 36.8 % (ref 36.0–46.0)
Hemoglobin: 12.4 g/dL (ref 12.0–15.0)
MCH: 29.7 pg (ref 26.0–34.0)
MCHC: 33.7 g/dL (ref 30.0–36.0)
MCV: 88 fL (ref 78.0–100.0)
Platelets: 270 10*3/uL (ref 150–400)
RBC: 4.18 MIL/uL (ref 3.87–5.11)
RDW: 13.5 % (ref 11.5–15.5)
WBC: 9.3 10*3/uL (ref 4.0–10.5)

## 2014-12-01 LAB — GLUCOSE, CAPILLARY: Glucose-Capillary: 211 mg/dL — ABNORMAL HIGH (ref 65–99)

## 2014-12-01 MED ORDER — TICAGRELOR 90 MG PO TABS: 90.0000 mg | ORAL_TABLET | Freq: Two times a day (BID) | ORAL | Status: DC

## 2014-12-01 MED ORDER — ISOSORBIDE DINITRATE 20 MG PO TABS: 20.0000 mg | ORAL_TABLET | Freq: Three times a day (TID) | ORAL | Status: DC

## 2014-12-01 NOTE — Progress Notes (Signed)
CARDIAC REHAB PHASE I   PRE:  Rate/Rhythm: 82 SR  BP:  Supine: 166/60  Sitting:   Standing:    SaO2:   MODE:  Ambulation: 1000 ft   POST:  Rate/Rhythm: 85 SR  BP:  Supine:   Sitting: 132/76  Standing:    SaO2:  0805-0900 Pt tolerated ambulation well without c/o of cp or SOB. VS stable. Pt to side of bed after walk. Completed discharge education with pt and her husband. She voices understanding. Pt agrees to [facility]. CRP in [place], will send referral.  Rodney Langton RN 12/01/2014 9:00 AM

## 2014-12-01 NOTE — Discharge Instructions (Signed)
PLEASE REMEMBER TO BRING ALL OF YOUR MEDICATIONS TO EACH OF YOUR FOLLOW-UP OFFICE VISITS. ° °PLEASE ATTEND ALL SCHEDULED FOLLOW-UP APPOINTMENTS.  ° °Activity: Increase activity slowly as tolerated. You may shower, but no soaking baths (or swimming) for 1 week. No driving for 2 days. No lifting over 5 lbs for 1 week. No sexual activity for 1 week.  ° °You May Return to Work: in 1 week (if applicable) ° °Wound Care: You may wash cath site gently with soap and water. Keep cath site clean and dry. If you notice pain, swelling, bleeding or pus at your cath site, please call [phone]. ° ° ° °Cardiac Cath Site Care °Refer to this sheet in the next few weeks. These instructions provide you with information on caring for yourself after your procedure. Your caregiver may also give you more specific instructions. Your treatment has been planned according to current medical practices, but problems sometimes occur. Call your caregiver if you have any problems or questions after your procedure. °HOME CARE INSTRUCTIONS °· You may shower 24 hours after the procedure. Remove the bandage (dressing) and gently wash the site with plain soap and water. Gently pat the site dry.  °· Do not apply powder or lotion to the site.  °· Do not sit in a bathtub, swimming pool, or whirlpool for 5 to 7 days.  °· No bending, squatting, or lifting anything over 10 pounds (4.5 kg) as directed by your caregiver.  °· Inspect the site at least twice daily.  °· Do not drive home if you are discharged the same day of the procedure. Have someone else drive you.  °· You may drive 24 hours after the procedure unless otherwise instructed by your caregiver.  °What to expect: °· Any bruising will usually fade within 1 to 2 weeks.  °· Blood that collects in the tissue (hematoma) may be painful to the touch. It should usually decrease in size and tenderness within 1 to 2 weeks.  °SEEK IMMEDIATE MEDICAL CARE IF: °· You have unusual pain at the site or down the  affected limb.  °· You have redness, warmth, swelling, or pain at the site.  °· You have drainage (other than a small amount of blood on the dressing).  °· You have chills.  °· You have a fever or persistent symptoms for more than 72 hours.  °· You have a fever and your symptoms suddenly get worse.  °· Your leg becomes pale, cool, tingly, or numb.  °· You have heavy bleeding from the site. Hold pressure on the site.  °Document Released: 07/11/2010 Document Revised: 05/28/2011 Document Reviewed: 07/11/2010 °ExitCare® Patient Information ©2012 [employer]. ° °

## 2014-12-01 NOTE — Discharge Summary (Signed)
CARDIOLOGY DISCHARGE SUMMARY   Patient ID: Virginia Huber MRN: [id number] DOB/AGE: 1946-04-26 69 y.o.  Admit date: 11/30/2014 Discharge date: 12/01/2014  PCP: Purvis Kilts, MD Primary Cardiologist: Dr. Bronson Ing  Primary Discharge Diagnosis:  Progressive angina Secondary Discharge Diagnosis:    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   Elevated troponin   CAD (coronary artery disease), native coronary artery  Procedures: Cardiac catheterization, coronary arteriogram, left ventriculogram, PTCA with Angiosculpt scoring balloon to the [facility] Course: Virginia Huber is a 69 y.o. female with a history of coronary artery disease, hypertension, PVD, and hyperlipidemia. Prior echocardiogram demonstrated normal left ventricular systolic function, EF 123456, severe LVH, and grade 2 diastolic dysfunction. She had symptoms consistent with progressive angina and was scheduled for outpatient cardiac catheterization. She came to the hospital for the procedure on 11/30/2014.  She had a cardiac catheterization with percutaneous intervention, procedure results are below. She tolerated the procedure well.  On 12/01/2014 she was seen by Dr. Claiborne Billings and by cardiac rehabilitation. She was ambulating without chest pain or shortness of breath and her cath site was without ecchymosis or hematoma. Her post-procedure labs were stable. No further inpatient workup was indicated and she is considered stable for discharge, to follow-up as an outpatient.  **Of note, her last lipid profile and liver function testing in this system was 2013. She is to continue her current lipid-lowering therapy and follow-up as an outpatient.**  **For anti-platelet therapy, she will be on aspirin and Brilinta for one month, no Pletal. At the end of 30 days Dr. Darrel Hoover. Fletcher Anon to decide on changing Brilinta to Plavix and/or resuming Pletal.**  Labs:   Lab  Results  Component Value Date   WBC 9.3 12/01/2014   HGB 12.4 12/01/2014   HCT 36.8 12/01/2014   MCV 88.0 12/01/2014   PLT 270 12/01/2014     Recent Labs Lab 12/01/14 0300  NA 138  K 3.7  CL 105  CO2 24  BUN 12  CREATININE 0.84  CALCIUM 8.9  GLUCOSE 231*    Recent Labs  11/29/14 0955  TROPONINI 0.04*    Recent Labs  11/30/14 0742  INR 0.99   Radiology: Dg Chest 2 View  11/29/2014   CLINICAL DATA:  Chest pain.  Elevated troponin.  EXAM: CHEST  2 VIEW  COMPARISON:  Chest x-ray dated 03/16/2012  FINDINGS: Heart size and pulmonary vascularity are normal and the lungs are clear. Osteophytes fuse much of the mid thoracic spine. No acute osseous abnormality. No effusions.  IMPRESSION: No active cardiopulmonary disease.   Electronically Signed   By: Lorriane Shire M.D.   On: 11/29/2014 12:32    Cardiac cath: 11/30/2014 Conclusion     Dist LAD lesion, 20% stenosed.  2nd RPLB lesion, 30% stenosed.  Prox Cx to Mid Cx lesion, 50% stenosed.  Ost 1st Mrg lesion, 95% stenosed. There is a 10% residual stenosis post intervention. Normal LV function. Coronary obstructive disease with smooth 20% narrowing in the mid LAD; 50% stenosis in the AV groove circumflex with 95% ostial stenosis with an upward takeoff of the OM1 vessel arising from this 50% region of narrowing; and dominant RCA with 30% PLA1 stenosis. Successful PCI to the left circumflex OM1 vessel treated with PTCA/Angiosculpt scoring balloon with the 95% stenosis being reduced to less than 10%. There is brisk TIMI-3 flow. There is no evidence for dissection. RECOMMENDATION:  Medical therapy for concomitant CAD.  EKG: 12/01/2014 Normal sinus rhythm ST & T wave abnormality, consider lateral ischemia - no significant change from preprocedure ECG Prolonged QT Vent. rate 72 BPM PR interval 184 ms QRS duration 98 ms QT/QTc 436/477 ms P-R-T axes 67 2 143  FOLLOW UP PLANS AND APPOINTMENTS Allergies    Allergen Reactions  . Penicillins Hives  . Percocet [Oxycodone-Acetaminophen] Nausea And Vomiting     Medication List    TAKE these medications        amLODipine 10 MG tablet  Commonly known as:  NORVASC  TAKE ONE (1) TABLET EACH DAY     aspirin EC 81 MG tablet  Take 1 tablet (81 mg total) by mouth daily.     atorvastatin 20 MG tablet  Commonly known as:  LIPITOR  Take 20 mg by mouth every other day.     cholecalciferol 1000 UNITS tablet  Commonly known as:  VITAMIN D  Take 1,000 Units by mouth daily.     glimepiride 2 MG tablet  Commonly known as:  AMARYL  Take 2 mg by mouth daily with breakfast.     insulin NPH Human 100 UNIT/ML injection  Commonly known as:  HUMULIN N,NOVOLIN N  Inject 30-35 Units into the skin 2 (two) times daily before a meal. *per sliding scale*     isosorbide dinitrate 20 MG tablet  Commonly known as:  ISORDIL  Take 20 mg by mouth 3 (three) times daily.     isosorbide dinitrate 20 MG tablet  Commonly known as:  ISORDIL  Take 1 tablet (20 mg total) by mouth 3 (three) times daily.     labetalol 300 MG tablet  Commonly known as:  NORMODYNE  TAKE ONE TABLET TWICE DAILY     levothyroxine 137 MCG tablet  Commonly known as:  SYNTHROID, LEVOTHROID  Take 137 mcg by mouth daily before breakfast.     losartan 100 MG tablet  Commonly known as:  COZAAR  TAKE ONE (1) TABLET BY MOUTH EVERY DAY     metFORMIN 500 MG tablet  Commonly known as:  GLUCOPHAGE  Take 500-1,000 mg by mouth 2 (two) times daily with a meal. Takes 1000mg  in the morning and 500mg  at night     naproxen sodium 220 MG tablet  Commonly known as:  ANAPROX  Take 220 mg by mouth daily as needed (pain).     nitroGLYCERIN 0.4 MG SL tablet  Commonly known as:  NITROSTAT  Place 0.4 mg under the tongue every 5 (five) minutes as needed for chest pain.     rOPINIRole 0.5 MG tablet  Commonly known as:  REQUIP  Take 0.5 mg by mouth at bedtime.     ticagrelor 90 MG Tabs tablet   Commonly known as:  BRILINTA  Take 1 tablet (90 mg total) by mouth 2 (two) times daily.        Discharge Instructions    Diet - low sodium heart healthy    Complete by:  As directed      Diet Carb Modified    Complete by:  As directed      Increase activity slowly    Complete by:  As directed           Follow-up Information    Follow up with Herminio Commons, MD.   Specialty:  Cardiology   Why:  The office will call.   Contact information:   [place] [address] [phone]       BRING  ALL MEDICATIONS WITH YOU TO FOLLOW UP APPOINTMENTS  Time spent with patient to include physician time: 43 min Signed: Rosaria Ferries, PA-C 12/01/2014, 9:25 AM Co-Sign MD

## 2014-12-01 NOTE — Progress Notes (Signed)
Patient Name: Virginia Huber Date of Encounter: 12/01/2014  Principal Problem:   Progressive angina Active Problems:   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   Elevated troponin   CAD (coronary artery disease), native coronary artery   Primary Cardiologist: Dr Bronson Ing  Patient Profile: 69 yo female w/ hx coronary artery disease, hypertension, PVD, and hyperlipidemia. Prior echocardiogram demonstrated normal left ventricular systolic function, EF 123456, severe LVH, and grade 2 diastolic dysfunction. She had symptoms consistent with progressive angina and was scheduled for outpatient cardiac catheterization.  SUBJECTIVE: No chest pain or SOB  OBJECTIVE Filed Vitals:   11/30/14 1930 12/01/14 0000 12/01/14 0001 12/01/14 0354  BP: 171/65 133/54 133/54 159/54  Pulse: 86 75 74 69  Temp: 98.2 F (36.8 C)  98.1 F (36.7 C) 98.3 F (36.8 C)  TempSrc: Oral  Oral Oral  Resp: 21  17 18   Height:      Weight:   162 lb 14.7 oz (73.9 kg)   SpO2: 95% 96% 96% 98%    Intake/Output Summary (Last 24 hours) at 12/01/14 0729 Last data filed at 12/01/14 [id number]  Gross per 24 hour  Intake 3617.5 ml  Output   6050 ml  Net -2432.5 ml   Filed Weights   11/30/14 0725 12/01/14 0001  Weight: 151 lb (68.493 kg) 162 lb 14.7 oz (73.9 kg)    PHYSICAL EXAM General: Well developed, well nourished, female in no acute distress. Head: Normocephalic, atraumatic.  Neck: Supple without bruits, JVD not elevated. Lungs:  Resp regular and unlabored, CTA. Heart: RRR, S1, S2, no S3, S4, or murmur; no rub. Abdomen: Soft, non-tender, non-distended, BS + x 4.  Extremities: No clubbing, cyanosis, no edema.  Neuro: Alert and oriented X 3. Moves all extremities spontaneously. Psych: Normal affect.  LABS: CBC:  Recent Labs  11/30/14 0742 12/01/14 0300  WBC 9.8 9.3  HGB 12.9 12.4  HCT 39.0 36.8  MCV 88.8 88.0  PLT 271 270   INR:  Recent  Labs  11/30/14 0742  INR [id number]   Basic Metabolic Panel:  Recent Labs  11/30/14 0742 12/01/14 0300  NA 137 138  K 4.1 3.7  CL 104 105  CO2 22 24  GLUCOSE 251* 231*  BUN 13 12  CREATININE 0.90 0.84  CALCIUM 9.3 8.9   Cardiac Enzymes:  Recent Labs  11/29/14 0955  TROPONINI 0.04*   TELE:  SR      Cardiac cath: 11/30/2014 Conclusion     Dist LAD lesion, 20% stenosed.  2nd RPLB lesion, 30% stenosed.  Prox Cx to Mid Cx lesion, 50% stenosed.  Ost 1st Mrg lesion, 95% stenosed. There is a 10% residual stenosis post intervention. Normal LV function. Coronary obstructive disease with smooth 20% narrowing in the mid LAD; 50% stenosis in the AV groove circumflex with 95% ostial stenosis with an upward takeoff of the OM1 vessel arising from this 50% region of narrowing; and dominant RCA with 30% PLA1 stenosis. Successful PCI to the left circumflex OM1 vessel treated with PTCA/Angiosculpt scoring balloon with the 95% stenosis being reduced to less than 10%. There is brisk TIMI-3 flow. There is no evidence for dissection. RECOMMENDATION:  Medical therapy for concomitant CAD.    Radiology/Studies: Dg Chest 2 View 11/29/2014   CLINICAL DATA:  Chest pain.  Elevated troponin.  EXAM: CHEST  2 VIEW  COMPARISON:  Chest x-ray dated 03/16/2012  FINDINGS: Heart size and pulmonary vascularity are normal and  the lungs are clear. Osteophytes fuse much of the mid thoracic spine. No acute osseous abnormality. No effusions.  IMPRESSION: No active cardiopulmonary disease.   Electronically Signed   By: Lorriane Shire M.D.   On: 11/29/2014 12:32     Current Medications:  . amLODipine  10 mg Oral Daily  . aspirin EC  81 mg Oral Daily  . atorvastatin  80 mg Oral q1800  . insulin aspart  0-9 Units Subcutaneous TID WC  . labetalol  300 mg Oral BID  . losartan  100 mg Oral Daily  . ticagrelor  90 mg Oral BID      ASSESSMENT AND PLAN: Principal Problem:   Progressive angina - s/p cath w/  PCI of the OM - Otherwise medical therapy - Patient wonders if she should continue Pletal since she is on Brilinta - Continue aspirin, statin, beta blocker and ARB  Otherwise doing well, cardiac rehabilitation to see in discharge today. Active Problems:   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   Elevated troponin   CAD (coronary artery disease), native coronary artery   Signed, Lenoard Aden 7:29 AM 12/01/2014   Patient seen and examined. Agree with assessment and plan. Feels well; no chest pain. S/P PCI with PTCA/Angiosculp to ostium of OM. Would continue ASA/brillinta for 1 month. DC pletal then consider ASA/Plavix with PVD.  F/U Dr. Buren Kos.   Troy Sine, MD, [facility] 12/01/2014 8:07 AM

## 2014-12-01 NOTE — Care Management Note (Signed)
Case Management Note  Patient Details  Name: JORDY HEWINS MRN: [id number] Date of Birth: [birth year]  Subjective/Objective:                   chest pain and throat pain Action/Plan:  Discharge planning Expected Discharge Date:  12/01/14               Expected Discharge Plan:  Home/Self Care  In-House Referral:     Discharge planning Services  CM Consult, Medication Assistance  Post Acute Care Choice:    Choice offered to:     DME Arranged:    DME Agency:     HH Arranged:    [place] Agency:     Status of Service:     Medicare Important Message Given:    Date Medicare IM Given:    Medicare IM give by:    Date Additional Medicare IM Given:    Additional Medicare Important Message give by:     If discussed at [place] of Stay Meetings, dates discussed:    Additional Comments: CM met with pt who has a Brilintal book. Pt verbalizes understanding 30 day card will cover today's prescription and give PCP enough time to have the medication authorized and insurance will defray cost of refills.  No other CM needs were communicated. Dellie Catholic, RN 12/01/2014, 9:06 AM

## 2014-12-03 ENCOUNTER — Telehealth: Payer: Self-pay | Admitting: Adult Health

## 2014-12-03 MED FILL — Heparin Sodium (Porcine) 2 Unit/ML in Sodium Chloride 0.9%: INTRAMUSCULAR | Qty: 1500 | Status: AC

## 2014-12-03 NOTE — Telephone Encounter (Signed)
Patient has had two episodes of dizziness,sweating ,low BP (93/54, 87/51) after taking Isordil 20 mg BID as instructed by hospital after cath on 11/30/14.Dose was increased to 30 mg TID last week by Dr Bronson Ing but patient was unable to fill (pharmacy issue) Per K lawrence NP,patient  should stop Isordil,patient made aware

## 2014-12-03 NOTE — Telephone Encounter (Signed)
Pt had a heart cath/balloon done on Friday 6/10 --pt states her blood pressure is dropping

## 2014-12-13 ENCOUNTER — Ambulatory Visit (INDEPENDENT_AMBULATORY_CARE_PROVIDER_SITE_OTHER): Payer: PPO | Admitting: Cardiovascular Disease

## 2014-12-13 ENCOUNTER — Encounter: Payer: Self-pay | Admitting: Cardiovascular Disease

## 2014-12-13 VITALS — BP 130/70 | HR 68 | Ht 63.0 in | Wt 148.0 lb

## 2014-12-13 DIAGNOSIS — I10 Essential (primary) hypertension: Secondary | ICD-10-CM

## 2014-12-13 DIAGNOSIS — I739 Peripheral vascular disease, unspecified: Secondary | ICD-10-CM | POA: Diagnosis not present

## 2014-12-13 DIAGNOSIS — I25708 Atherosclerosis of coronary artery bypass graft(s), unspecified, with other forms of angina pectoris: Secondary | ICD-10-CM | POA: Diagnosis not present

## 2014-12-13 DIAGNOSIS — E785 Hyperlipidemia, unspecified: Secondary | ICD-10-CM

## 2014-12-13 DIAGNOSIS — Z9861 Coronary angioplasty status: Secondary | ICD-10-CM

## 2014-12-13 NOTE — Progress Notes (Signed)
Patient ID: Virginia Huber, female   DOB: 06/17/1946, 69 y.o.   MRN: [id number]      SUBJECTIVE: The patient returns after undergoing coronary angiography and PCI for chest pain in the context of CAD and prior history of MI. She underwent successful PCI to the first obtuse marginal branch of the circumflex which was 95% stenosed treated with PTCA/and juice sculpt scoring balloon with the stenosis reduced to less than 10%. She had 50% proximal to mid circumflex stenosis as well.  She was instructed to take ASA and Brilinta for 1 month and afterwards could be switched to either Plavix or resuming Pletal, the latter currently on hold as she is on dual antiplatelet therapy.  She is doing well today and denies chest pain and shortness of breath. She feels somewhat fatigued but has just begun physical activity by walking in her pool yesterday for 30 minutes.  She is scheduled to start cardiac rehabilitation in the near future.    Review of Systems: As per "subjective", otherwise negative.  Allergies  Allergen Reactions  . Penicillins Hives  . Percocet [Oxycodone-Acetaminophen] Nausea And Vomiting    Current Outpatient Prescriptions  Medication Sig Dispense Refill  . amLODipine (NORVASC) 10 MG tablet TAKE ONE (1) TABLET EACH DAY 90 tablet 3  . aspirin EC 81 MG tablet Take 1 tablet (81 mg total) by mouth daily.    [name] atorvastatin (LIPITOR) 20 MG tablet Take 20 mg by mouth every other day.    . cholecalciferol (VITAMIN D) 1000 UNITS tablet Take 1,000 Units by mouth daily.    [name] glimepiride (AMARYL) 2 MG tablet Take 2 mg by mouth daily with breakfast.    . insulin NPH Human (HUMULIN N,NOVOLIN N) 100 UNIT/ML injection Inject 30-35 Units into the skin 2 (two) times daily before a meal. *per sliding scale*    . labetalol (NORMODYNE) 300 MG tablet TAKE ONE TABLET TWICE DAILY 180 tablet 3  . levothyroxine (SYNTHROID, LEVOTHROID) 137 MCG tablet Take 137 mcg by mouth daily before breakfast.    .  losartan (COZAAR) 100 MG tablet TAKE ONE (1) TABLET BY MOUTH EVERY DAY 90 tablet 3  . metFORMIN (GLUCOPHAGE) 500 MG tablet Take 500-1,000 mg by mouth 2 (two) times daily with a meal. Takes 1000mg  in the morning and 500mg  at night    . naproxen sodium (ANAPROX) 220 MG tablet Take 220 mg by mouth daily as needed (pain).    . nitroGLYCERIN (NITROSTAT) 0.4 MG SL tablet Place 0.4 mg under the tongue every 5 (five) minutes as needed for chest pain.    [name] rOPINIRole (REQUIP) 0.5 MG tablet Take 0.5 mg by mouth at bedtime.   0  . ticagrelor (BRILINTA) 90 MG TABS tablet Take 1 tablet (90 mg total) by mouth 2 (two) times daily. 60 tablet 11   No current facility-administered medications for this visit.    Past Medical History  Diagnosis Date  . HTN (hypertension)   . DM (diabetes mellitus)   . Heart block   . Hypothyroidism   . Ovarian tumor   . Lupus   . S/P colonoscopy 2005    Dr. Laural Golden: pancolonic divericula, polyp, path unknown currently  . Coronary artery disease 11/30/2014    PCI to the OM1  . UTI (urinary tract infection) 05/08/2013  . Superficial fungus infection of skin 06/29/2013  . Myocardial infarction 02/2012  . TIA (transient ischemic attack)   . GERD (gastroesophageal reflux disease)   . Arthritis  Past Surgical History  Procedure Laterality Date  . Cholecystectomy    . Colostomy      then reversed  . Partial hysterectomy      left ovaries, then ovaries removed later due tumors   . Oophorectomy      1980, nicked bowel, peritonitis, colostomy, now colostomy reversed   . Eye surgeries    . Cardiac catheterization  2008    Tiny OM-2 with 90% narrowing. Med tx.  . Nuclear med stress test  10/2011    Small area of mild ischemia inferoapically.  . Abdominal aortagram N/A 01/03/2014    Procedure: ABDOMINAL [name];  Surgeon: Wellington Hampshire, MD;  Location: [facility] CATH LAB;  Service: Cardiovascular;  Laterality: N/A;  . Lower extremity angiogram N/A 01/03/2014    Procedure:  LOWER EXTREMITY ANGIOGRAM;  Surgeon: Wellington Hampshire, MD;  Location: [facility] CATH LAB;  Service: Cardiovascular;  Laterality: N/A;  . Cardiac catheterization N/A 11/30/2014    Procedure: Left Heart Cath and Coronary Angiography;  Surgeon: Troy Sine, MD; LAD 20%, CFX 50%, OM1 95%, right PLB 30%, LV normal   . Cardiac catheterization N/A 11/30/2014    Procedure: Coronary Balloon Angioplasty;  Surgeon: Troy Sine, MD;  Angiosculpt scoring balloon and PTCA to the OM1 reducing stenosis from 95% to less than 10%  . Colostomy reversal  1981    History   Social History  . Marital Status: Married    Spouse Name: N/A  . Number of Children: N/A  . Years of Education: N/A   Occupational History  . Retired     Research officer, political party   Social History Main Topics  . Smoking status: Never Smoker   . Smokeless tobacco: Never Used  . Alcohol Use: No  . Drug Use: No  . Sexual Activity: Not Currently    Birth Control/ Protection: Surgical   Other Topics Concern  . Not on file   Social History Narrative     Filed Vitals:   12/13/14 1353  BP: 130/70  Pulse: 68  Height: 5\' 3"  (1.6 m)  Weight: 148 lb (67.132 kg)  SpO2: 98%    PHYSICAL EXAM General: NAD HEENT: Normal. Neck: No JVD, no thyromegaly. Lungs: Clear to auscultation bilaterally with normal respiratory effort. CV: Nondisplaced PMI.  Regular rate and rhythm, normal S1/S2, no S3/S4, no murmur. No pretibial or periankle edema.   Abdomen: Soft, nontender, no hepatosplenomegaly, no distention.  Neurologic: Alert and oriented x 3.  Psych: Normal affect. Skin: Normal. Musculoskeletal: Normal range of motion, no gross deformities. Extremities: No clubbing or cyanosis.   ECG: Most recent ECG reviewed.      ASSESSMENT AND PLAN: 1. CAD and h/o MI s/p PTCA of OM1: Stable ischemic heart disease. Scheduled to start cardiac rehabilitation in the near future. Continue dual antiplatelet therapy with ASA and Brilinta (possibly longer than  one month). Will obtain echocardiogram to assess for interval change in LV systolic function and/or regional wall motion as prior symptoms and troponin elevation were concerning for MI. Continue Lipitor, labetalol, and losartan.  2. Essential HTN: Controlled on present therapy. Continue labetalol, losartan, and amlodipine.  3. Hyperlipidemia: Continue Lipitor 20 mg daily and fish oil.  4. Daytime somnolence and fatigue: She was scheduled by me to have a sleep study but did not have it last year.  5. PVD: Saw Dr. Fletcher Anon last month. May consider Pletal after Brilinta is stopped.  Dispo: f/u 3 months.   Kate Sable, M.D., F.A.C.C.

## 2014-12-13 NOTE — Patient Instructions (Signed)
Your physician recommends that you schedule a follow-up appointment in: 3 months with Dr. Bronson Ing  Your physician has requested that you have an echocardiogram. Echocardiography is a painless test that uses sound waves to create images of your heart. It provides your doctor with information about the size and shape of your heart and how well your heart's chambers and valves are working. This procedure takes approximately one hour. There are no restrictions for this procedure.  Your physician recommends that you continue on your current medications as directed. Please refer to the Current Medication list given to you today.   Thanks for choosing [facility]!!!

## 2014-12-14 ENCOUNTER — Ambulatory Visit (HOSPITAL_COMMUNITY)
Admission: RE | Admit: 2014-12-14 | Discharge: 2014-12-14 | Disposition: A | Discharge status: Home / Self Care | Payer: PPO | Source: Ambulatory Visit | Attending: Cardiovascular Disease | Admitting: Cardiovascular Disease

## 2014-12-14 DIAGNOSIS — I10 Essential (primary) hypertension: Secondary | ICD-10-CM | POA: Diagnosis not present

## 2014-12-14 DIAGNOSIS — I25708 Atherosclerosis of coronary artery bypass graft(s), unspecified, with other forms of angina pectoris: Secondary | ICD-10-CM | POA: Diagnosis not present

## 2014-12-14 DIAGNOSIS — I2510 Atherosclerotic heart disease of native coronary artery without angina pectoris: Secondary | ICD-10-CM | POA: Insufficient documentation

## 2014-12-14 NOTE — Progress Notes (Signed)
*  PRELIMINARY RESULTS* Echocardiogram 2D Echocardiogram has been performed.  Leavy Cella 12/14/2014, 11:18 AM

## 2014-12-17 ENCOUNTER — Other Ambulatory Visit: Payer: Self-pay

## 2014-12-20 ENCOUNTER — Encounter (HOSPITAL_COMMUNITY)
Admission: RE | Admit: 2014-12-20 | Discharge: 2014-12-20 | Disposition: A | Discharge status: Home / Self Care | Payer: PPO | Source: Ambulatory Visit | Attending: Cardiovascular Disease | Admitting: Cardiovascular Disease

## 2014-12-20 VITALS — BP 122/58 | HR 64 | Ht 63.0 in | Wt 151.1 lb

## 2014-12-20 DIAGNOSIS — Z9862 Peripheral vascular angioplasty status: Secondary | ICD-10-CM

## 2014-12-20 DIAGNOSIS — I2510 Atherosclerotic heart disease of native coronary artery without angina pectoris: Secondary | ICD-10-CM | POA: Diagnosis not present

## 2014-12-20 DIAGNOSIS — I213 ST elevation (STEMI) myocardial infarction of unspecified site: Secondary | ICD-10-CM | POA: Diagnosis not present

## 2014-12-20 DIAGNOSIS — Z9861 Coronary angioplasty status: Secondary | ICD-10-CM | POA: Diagnosis not present

## 2014-12-20 NOTE — Progress Notes (Signed)
Patient arrived at 8:00am. Patient was referred to [facility] by Dr. Bronson Ing post Balloon Angioplasty [phone]. During orientation advised patient on arrival and appointment times what to wear, what to do before, during and after exercise. Reviewed attendance and class policy. Talked about inclement weather and class consultation policy. Patient first orientation/education visit was 12/20/14. Pt is scheduled to start Cardiac Rehab on 12/26/14 at 9:30am. Pt was advised to come to class 5 minutes before class starts. She was also given instructions on meeting with the dietician and attending the Family Structure classes. Pt is eager to get started. Patient was able to finish 6 minute walk test. Patient was shown the gym and instructed on equipment safety. She was measured for the equipment. Patients pre-body measurements were taken. Patient 1st orientation/education visit ended at 10:20am.

## 2014-12-20 NOTE — Progress Notes (Signed)
Cardiac/Pulmonary Rehab Medication Review by a Pharmacist  Does the patient  feel that his/her medications are working for him/her?  yes  Has the patient been experiencing any side effects to the medications prescribed?  No (Diarrhea potentially from metformin)  Does the patient measure his/her own blood pressure or blood glucose at home?  yes  (both)  Does the patient have any problems obtaining medications due to transportation or finances?   no  Understanding of regimen: excellent Understanding of indications: excellent Potential of compliance: excellent  Pharmacist comments: Understands medications well.  Answered question regarding potential diarrhea as a side effect of metformin.  Pricilla Larsson 12/20/2014 9:09 AM

## 2014-12-26 ENCOUNTER — Encounter (HOSPITAL_COMMUNITY)
Admission: RE | Admit: 2014-12-26 | Discharge: 2014-12-26 | Disposition: A | Discharge status: Home / Self Care | Payer: PPO | Source: Ambulatory Visit | Attending: Cardiovascular Disease | Admitting: Cardiovascular Disease

## 2014-12-26 DIAGNOSIS — Z9861 Coronary angioplasty status: Secondary | ICD-10-CM | POA: Insufficient documentation

## 2014-12-26 DIAGNOSIS — I213 ST elevation (STEMI) myocardial infarction of unspecified site: Secondary | ICD-10-CM | POA: Insufficient documentation

## 2014-12-26 DIAGNOSIS — I2510 Atherosclerotic heart disease of native coronary artery without angina pectoris: Secondary | ICD-10-CM | POA: Insufficient documentation

## 2014-12-28 ENCOUNTER — Encounter (HOSPITAL_COMMUNITY)
Admission: RE | Admit: 2014-12-28 | Discharge: 2014-12-28 | Disposition: A | Discharge status: Home / Self Care | Payer: PPO | Source: Ambulatory Visit | Attending: Cardiovascular Disease | Admitting: Cardiovascular Disease

## 2014-12-28 DIAGNOSIS — I213 ST elevation (STEMI) myocardial infarction of unspecified site: Secondary | ICD-10-CM | POA: Diagnosis not present

## 2014-12-31 ENCOUNTER — Encounter (HOSPITAL_COMMUNITY)
Admission: RE | Admit: 2014-12-31 | Discharge: 2014-12-31 | Disposition: A | Discharge status: Home / Self Care | Payer: PPO | Source: Ambulatory Visit | Attending: Cardiovascular Disease | Admitting: Cardiovascular Disease

## 2014-12-31 DIAGNOSIS — I213 ST elevation (STEMI) myocardial infarction of unspecified site: Secondary | ICD-10-CM | POA: Diagnosis not present

## 2014-12-31 NOTE — Progress Notes (Signed)
Cardiac Rehabilitation Program Outcomes Report   Orientation:  12/20/14 Graduate Date:  tbd Discharge Date:  tbd # of sessions completed: 3  Cardiologist: Bronson Ing Family MD:  Bethann Berkshire Time:  0930  A.  Exercise Program:  Tolerates exercise @ 3.45  METS for 15 minutes and Walk Test Results:  Pre: 2.45 mets  B.  Mental Health:  Good mental attitude  C.  Education/Instruction/Skills  Accurately checks own pulse.  Rest:  93  Exercise:  85  Uses Perceived Exertion Scale and/or Dyspnea Scale  D.  Nutrition/Weight Control/Body Composition:  Adherence to prescribed nutrition program: fair    E.  Blood Lipids    Lab Results  Component Value Date   CHOL 127 02/29/2012   HDL 25* 02/29/2012   LDLCALC 24 02/29/2012   TRIG 392* 02/29/2012   CHOLHDL 5.1 02/29/2012    F.  Lifestyle Changes:  Making positive lifestyle changes  G.  Symptoms noted with exercise:  Asymptomatic  Report Completed By:  Stevphen Rochester RN   Comments:  This is patients first week progress note for [facility].

## 2015-01-02 ENCOUNTER — Encounter (HOSPITAL_COMMUNITY)
Admission: RE | Admit: 2015-01-02 | Discharge: 2015-01-02 | Disposition: A | Discharge status: Home / Self Care | Payer: PPO | Source: Ambulatory Visit | Attending: Cardiovascular Disease | Admitting: Cardiovascular Disease

## 2015-01-02 DIAGNOSIS — I213 ST elevation (STEMI) myocardial infarction of unspecified site: Secondary | ICD-10-CM | POA: Diagnosis not present

## 2015-01-04 ENCOUNTER — Encounter (HOSPITAL_COMMUNITY)
Admission: RE | Admit: 2015-01-04 | Discharge: 2015-01-04 | Disposition: A | Discharge status: Home / Self Care | Payer: PPO | Source: Ambulatory Visit | Attending: Cardiovascular Disease | Admitting: Cardiovascular Disease

## 2015-01-04 DIAGNOSIS — I213 ST elevation (STEMI) myocardial infarction of unspecified site: Secondary | ICD-10-CM | POA: Diagnosis not present

## 2015-01-07 ENCOUNTER — Encounter (HOSPITAL_COMMUNITY)
Admission: RE | Admit: 2015-01-07 | Discharge: 2015-01-07 | Disposition: A | Discharge status: Home / Self Care | Payer: PPO | Source: Ambulatory Visit | Attending: Cardiovascular Disease | Admitting: Cardiovascular Disease

## 2015-01-07 DIAGNOSIS — I213 ST elevation (STEMI) myocardial infarction of unspecified site: Secondary | ICD-10-CM | POA: Diagnosis not present

## 2015-01-09 ENCOUNTER — Encounter (HOSPITAL_COMMUNITY)
Admission: RE | Admit: 2015-01-09 | Discharge: 2015-01-09 | Disposition: A | Discharge status: Home / Self Care | Payer: PPO | Source: Ambulatory Visit | Attending: Cardiovascular Disease | Admitting: Cardiovascular Disease

## 2015-01-09 DIAGNOSIS — I213 ST elevation (STEMI) myocardial infarction of unspecified site: Secondary | ICD-10-CM | POA: Diagnosis not present

## 2015-01-11 ENCOUNTER — Encounter (HOSPITAL_COMMUNITY)
Admission: RE | Admit: 2015-01-11 | Discharge: 2015-01-11 | Disposition: A | Discharge status: Home / Self Care | Payer: PPO | Source: Ambulatory Visit | Attending: Cardiovascular Disease | Admitting: Cardiovascular Disease

## 2015-01-11 DIAGNOSIS — I213 ST elevation (STEMI) myocardial infarction of unspecified site: Secondary | ICD-10-CM | POA: Diagnosis not present

## 2015-01-14 ENCOUNTER — Encounter (HOSPITAL_COMMUNITY)
Admission: RE | Admit: 2015-01-14 | Discharge: 2015-01-14 | Disposition: A | Discharge status: Home / Self Care | Payer: PPO | Source: Ambulatory Visit | Attending: Cardiovascular Disease | Admitting: Cardiovascular Disease

## 2015-01-14 DIAGNOSIS — I213 ST elevation (STEMI) myocardial infarction of unspecified site: Secondary | ICD-10-CM | POA: Diagnosis not present

## 2015-01-14 NOTE — Progress Notes (Signed)
Patient was given individual home exercise plan today, 01/14/15. Handout was reviewed and discussed. Patient verbalized an understanding.

## 2015-01-16 ENCOUNTER — Encounter (HOSPITAL_COMMUNITY)
Admission: RE | Admit: 2015-01-16 | Discharge: 2015-01-16 | Disposition: A | Discharge status: Home / Self Care | Payer: PPO | Source: Ambulatory Visit | Attending: Cardiovascular Disease | Admitting: Cardiovascular Disease

## 2015-01-16 DIAGNOSIS — I213 ST elevation (STEMI) myocardial infarction of unspecified site: Secondary | ICD-10-CM | POA: Diagnosis not present

## 2015-01-18 ENCOUNTER — Encounter (HOSPITAL_COMMUNITY)
Admission: RE | Admit: 2015-01-18 | Discharge: 2015-01-18 | Disposition: A | Discharge status: Home / Self Care | Payer: PPO | Source: Ambulatory Visit | Attending: Cardiovascular Disease | Admitting: Cardiovascular Disease

## 2015-01-18 DIAGNOSIS — I213 ST elevation (STEMI) myocardial infarction of unspecified site: Secondary | ICD-10-CM | POA: Diagnosis not present

## 2015-01-21 ENCOUNTER — Encounter (HOSPITAL_COMMUNITY)
Admission: RE | Admit: 2015-01-21 | Discharge: 2015-01-21 | Disposition: A | Discharge status: Home / Self Care | Payer: PPO | Source: Ambulatory Visit | Attending: Cardiovascular Disease | Admitting: Cardiovascular Disease

## 2015-01-21 DIAGNOSIS — I2510 Atherosclerotic heart disease of native coronary artery without angina pectoris: Secondary | ICD-10-CM | POA: Diagnosis not present

## 2015-01-21 DIAGNOSIS — I213 ST elevation (STEMI) myocardial infarction of unspecified site: Secondary | ICD-10-CM | POA: Insufficient documentation

## 2015-01-21 DIAGNOSIS — Z9861 Coronary angioplasty status: Secondary | ICD-10-CM | POA: Insufficient documentation

## 2015-01-23 ENCOUNTER — Encounter (HOSPITAL_COMMUNITY)
Admission: RE | Admit: 2015-01-23 | Discharge: 2015-01-23 | Disposition: A | Discharge status: Home / Self Care | Payer: PPO | Source: Ambulatory Visit | Attending: Cardiovascular Disease | Admitting: Cardiovascular Disease

## 2015-01-23 DIAGNOSIS — I213 ST elevation (STEMI) myocardial infarction of unspecified site: Secondary | ICD-10-CM | POA: Diagnosis not present

## 2015-01-25 ENCOUNTER — Encounter (HOSPITAL_COMMUNITY)
Admission: RE | Admit: 2015-01-25 | Discharge: 2015-01-25 | Disposition: A | Discharge status: Home / Self Care | Payer: PPO | Source: Ambulatory Visit | Attending: Cardiovascular Disease | Admitting: Cardiovascular Disease

## 2015-01-25 DIAGNOSIS — I213 ST elevation (STEMI) myocardial infarction of unspecified site: Secondary | ICD-10-CM | POA: Diagnosis not present

## 2015-01-28 ENCOUNTER — Encounter (HOSPITAL_COMMUNITY)
Admission: RE | Admit: 2015-01-28 | Discharge: 2015-01-28 | Disposition: A | Discharge status: Home / Self Care | Payer: PPO | Source: Ambulatory Visit | Attending: Cardiovascular Disease | Admitting: Cardiovascular Disease

## 2015-01-28 DIAGNOSIS — I213 ST elevation (STEMI) myocardial infarction of unspecified site: Secondary | ICD-10-CM | POA: Diagnosis not present

## 2015-01-30 ENCOUNTER — Encounter (HOSPITAL_COMMUNITY)
Admission: RE | Admit: 2015-01-30 | Discharge: 2015-01-30 | Disposition: A | Discharge status: Home / Self Care | Payer: PPO | Source: Ambulatory Visit | Attending: Cardiovascular Disease | Admitting: Cardiovascular Disease

## 2015-01-30 DIAGNOSIS — I213 ST elevation (STEMI) myocardial infarction of unspecified site: Secondary | ICD-10-CM | POA: Diagnosis not present

## 2015-02-01 ENCOUNTER — Encounter (HOSPITAL_COMMUNITY)
Admission: RE | Admit: 2015-02-01 | Discharge: 2015-02-01 | Disposition: A | Discharge status: Home / Self Care | Payer: PPO | Source: Ambulatory Visit | Attending: Cardiovascular Disease | Admitting: Cardiovascular Disease

## 2015-02-01 DIAGNOSIS — I213 ST elevation (STEMI) myocardial infarction of unspecified site: Secondary | ICD-10-CM | POA: Diagnosis not present

## 2015-02-04 ENCOUNTER — Encounter (HOSPITAL_COMMUNITY)
Admission: RE | Admit: 2015-02-04 | Discharge: 2015-02-04 | Disposition: A | Discharge status: Home / Self Care | Payer: PPO | Source: Ambulatory Visit | Attending: Cardiovascular Disease | Admitting: Cardiovascular Disease

## 2015-02-04 DIAGNOSIS — I213 ST elevation (STEMI) myocardial infarction of unspecified site: Secondary | ICD-10-CM | POA: Diagnosis not present

## 2015-02-06 ENCOUNTER — Encounter (HOSPITAL_COMMUNITY): Payer: PPO

## 2015-02-06 NOTE — Progress Notes (Signed)
Cardiac Rehabilitation Program Outcomes Report   Orientation:  12/20/14 Graduate Date:  tbd Discharge Date:  tbd # of sessions completed: 18  Cardiologist: Gaynelle Cage MD:  Bethann Berkshire Time:  0930  A.  Exercise Program:  Tolerates exercise @ 3.80 METS for 15 minutes  B.  Mental Health:  Good mental attitude  C.  Education/Instruction/Skills  Accurately checks own pulse.  Rest:  67  Exercise:  89 and Knows THR for exercise  Uses Perceived Exertion Scale and/or Dyspnea Scale  D.  Nutrition/Weight Control/Body Composition:  Adherence to prescribed nutrition program: good    E.  Blood Lipids    Lab Results  Component Value Date   CHOL 127 02/29/2012   HDL 25* 02/29/2012   LDLCALC 24 02/29/2012   TRIG 392* 02/29/2012   CHOLHDL 5.1 02/29/2012    F.  Lifestyle Changes:  Making positive lifestyle changes  G.  Symptoms noted with exercise:  Asymptomatic  Report Completed By:  Stevphen Rochester RN   Comments:  This is the patients halfway progress note for [facility].  Patient is progressing well in the program.

## 2015-02-08 ENCOUNTER — Encounter (HOSPITAL_COMMUNITY)
Admission: RE | Admit: 2015-02-08 | Discharge: 2015-02-08 | Disposition: A | Discharge status: Home / Self Care | Payer: PPO | Source: Ambulatory Visit | Attending: Cardiovascular Disease | Admitting: Cardiovascular Disease

## 2015-02-08 DIAGNOSIS — I213 ST elevation (STEMI) myocardial infarction of unspecified site: Secondary | ICD-10-CM | POA: Diagnosis not present

## 2015-02-11 ENCOUNTER — Encounter: Payer: Self-pay | Admitting: Cardiovascular Disease

## 2015-02-11 ENCOUNTER — Encounter (HOSPITAL_COMMUNITY)
Admission: RE | Admit: 2015-02-11 | Discharge: 2015-02-11 | Disposition: A | Discharge status: Home / Self Care | Payer: PPO | Source: Ambulatory Visit | Attending: Cardiovascular Disease | Admitting: Cardiovascular Disease

## 2015-02-11 DIAGNOSIS — I213 ST elevation (STEMI) myocardial infarction of unspecified site: Secondary | ICD-10-CM | POA: Diagnosis not present

## 2015-02-13 ENCOUNTER — Ambulatory Visit (INDEPENDENT_AMBULATORY_CARE_PROVIDER_SITE_OTHER): Payer: PPO | Admitting: Cardiovascular Disease

## 2015-02-13 ENCOUNTER — Encounter: Payer: Self-pay | Admitting: Cardiovascular Disease

## 2015-02-13 ENCOUNTER — Encounter (HOSPITAL_COMMUNITY)
Admission: RE | Admit: 2015-02-13 | Discharge: 2015-02-13 | Disposition: A | Discharge status: Home / Self Care | Payer: PPO | Source: Ambulatory Visit | Attending: Cardiovascular Disease | Admitting: Cardiovascular Disease

## 2015-02-13 VITALS — BP 134/68 | HR 63 | Ht 63.0 in | Wt 151.0 lb

## 2015-02-13 DIAGNOSIS — I739 Peripheral vascular disease, unspecified: Secondary | ICD-10-CM

## 2015-02-13 DIAGNOSIS — M7989 Other specified soft tissue disorders: Secondary | ICD-10-CM

## 2015-02-13 DIAGNOSIS — E785 Hyperlipidemia, unspecified: Secondary | ICD-10-CM

## 2015-02-13 DIAGNOSIS — I213 ST elevation (STEMI) myocardial infarction of unspecified site: Secondary | ICD-10-CM | POA: Diagnosis not present

## 2015-02-13 DIAGNOSIS — I252 Old myocardial infarction: Secondary | ICD-10-CM

## 2015-02-13 DIAGNOSIS — R0602 Shortness of breath: Secondary | ICD-10-CM

## 2015-02-13 DIAGNOSIS — G4730 Sleep apnea, unspecified: Secondary | ICD-10-CM

## 2015-02-13 DIAGNOSIS — Z9861 Coronary angioplasty status: Secondary | ICD-10-CM

## 2015-02-13 DIAGNOSIS — I10 Essential (primary) hypertension: Secondary | ICD-10-CM

## 2015-02-13 DIAGNOSIS — R079 Chest pain, unspecified: Secondary | ICD-10-CM

## 2015-02-13 DIAGNOSIS — I25708 Atherosclerosis of coronary artery bypass graft(s), unspecified, with other forms of angina pectoris: Secondary | ICD-10-CM | POA: Diagnosis not present

## 2015-02-13 MED ORDER — FUROSEMIDE 20 MG PO TABS: 20.0000 mg | ORAL_TABLET | ORAL | Status: DC | PRN

## 2015-02-13 MED ORDER — ATORVASTATIN CALCIUM 10 MG PO TABS: 10.0000 mg | ORAL_TABLET | Freq: Every day | ORAL | Status: DC

## 2015-02-13 MED ORDER — POTASSIUM CHLORIDE ER 10 MEQ PO TBCR: 10.0000 meq | EXTENDED_RELEASE_TABLET | ORAL | Status: DC | PRN

## 2015-02-13 MED ORDER — CLOPIDOGREL BISULFATE 75 MG PO TABS: 75.0000 mg | ORAL_TABLET | Freq: Every day | ORAL | Status: DC

## 2015-02-13 MED ORDER — FISH OIL 1200 MG PO CAPS
1.0000 | ORAL_CAPSULE | Freq: Every day | ORAL | Status: DC
Start: 2015-02-13 — End: 2016-04-27

## 2015-02-13 NOTE — Patient Instructions (Addendum)
   Stop Brilanta.  Begin Plavix 75mg  daily.  Begin Lasix 20mg  as needed for leg swelling.  Begin Potassium 18meq - take as needed on the days that you take your Lasix.  Refill sent in for Lipitor also.  Instead of doing 20mg  every other day, will change to 10mg  daily.  New prescriptions have been sent to [facility] today.    Fish Oil - take one capsule by mouth daily - may buy OTC brand.  Continue all other medications.   Follow up in  3 months.

## 2015-02-13 NOTE — Progress Notes (Signed)
Patient ID: Virginia Huber, female   DOB: [birth year], 69 y.o.   MRN: [id number]      SUBJECTIVE: The patient returns for routine follow up. She has a history of CAD and MI. She underwent successful PCI to the first obtuse marginal branch of the circumflex which was 95% stenosed treated with PTCA/and juice sculpt scoring balloon with the stenosis reduced to less than 10% on 11/30/14. She had 50% proximal to mid circumflex stenosis as well.  She was instructed to take ASA and Brilinta for 1 month and afterwards could be switched to either Plavix or resuming Pletal, the latter currently on hold as she is on dual antiplatelet therapy.  She has had infrequent chest pain and has only taken sublingual nitroglycerin on one occasion, but was uncertain if it was due to GERD. She has been more short of breath lately and has occasional leg swelling.   Lipid panel on 02/07/15 showed total cholesterol 113, trig 382, LDL 10, HDL 27. She has completed more than one half of the cardiac rehab program.  Echocardiogram on 12/14/14 demonstrated normal left ventricular systolic function and regional wall motion, LVEF 55-60%.   Review of Systems: As per "subjective", otherwise negative.  Allergies  Allergen Reactions  . Penicillins Hives  . Percocet [Oxycodone-Acetaminophen] Nausea And Vomiting    Current Outpatient Prescriptions  Medication Sig Dispense Refill  . amLODipine (NORVASC) 10 MG tablet TAKE ONE (1) TABLET EACH DAY 90 tablet 3  . aspirin EC 81 MG tablet Take 1 tablet (81 mg total) by mouth daily.    [name] atorvastatin (LIPITOR) 20 MG tablet Take 20 mg by mouth every other day.    . cholecalciferol (VITAMIN D) 1000 UNITS tablet Take 1,000 Units by mouth daily.    . insulin NPH Human (HUMULIN N,NOVOLIN N) 100 UNIT/ML injection Inject 30-35 Units into the skin 2 (two) times daily before a meal. *per sliding scale*    . labetalol (NORMODYNE) 300 MG tablet TAKE ONE TABLET TWICE DAILY 180 tablet 3  .  levothyroxine (SYNTHROID, LEVOTHROID) 137 MCG tablet Take 137 mcg by mouth daily before breakfast.    . losartan (COZAAR) 100 MG tablet TAKE ONE (1) TABLET BY MOUTH EVERY DAY 90 tablet 3  . metFORMIN (GLUCOPHAGE) 500 MG tablet Take 500-1,000 mg by mouth 2 (two) times daily with a meal. Takes 1000mg  in the morning and 500mg  at night    . nitroGLYCERIN (NITROSTAT) 0.4 MG SL tablet Place 0.4 mg under the tongue every 5 (five) minutes as needed for chest pain.    [name] rOPINIRole (REQUIP) 0.5 MG tablet Take 0.5 mg by mouth at bedtime.   0  . ticagrelor (BRILINTA) 90 MG TABS tablet Take 1 tablet (90 mg total) by mouth 2 (two) times daily. 60 tablet 11   No current facility-administered medications for this visit.    Past Medical History  Diagnosis Date  . HTN (hypertension)   . DM (diabetes mellitus)   . Heart block   . Hypothyroidism   . Ovarian tumor   . Lupus   . S/P colonoscopy 2005    Dr. Laural Golden: pancolonic divericula, polyp, path unknown currently  . Coronary artery disease 11/30/2014    PCI to the OM1  . UTI (urinary tract infection) 05/08/2013  . Superficial fungus infection of skin 06/29/2013  . Myocardial infarction 02/2012  . TIA (transient ischemic attack)   . GERD (gastroesophageal reflux disease)   . Arthritis     Past Surgical History  Procedure  Laterality Date  . Cholecystectomy    . Colostomy      then reversed  . Partial hysterectomy      left ovaries, then ovaries removed later due tumors   . Oophorectomy      1980, nicked bowel, peritonitis, colostomy, now colostomy reversed   . Eye surgeries    . Cardiac catheterization  2008    Tiny OM-2 with 90% narrowing. Med tx.  . Nuclear med stress test  10/2011    Small area of mild ischemia inferoapically.  . Abdominal aortagram N/A 01/03/2014    Procedure: ABDOMINAL [name];  Surgeon: Wellington Hampshire, MD;  Location: [facility] CATH LAB;  Service: Cardiovascular;  Laterality: N/A;  . Lower extremity angiogram N/A 01/03/2014     Procedure: LOWER EXTREMITY ANGIOGRAM;  Surgeon: Wellington Hampshire, MD;  Location: [facility] CATH LAB;  Service: Cardiovascular;  Laterality: N/A;  . Cardiac catheterization N/A 11/30/2014    Procedure: Left Heart Cath and Coronary Angiography;  Surgeon: Troy Sine, MD; LAD 20%, CFX 50%, OM1 95%, right PLB 30%, LV normal   . Cardiac catheterization N/A 11/30/2014    Procedure: Coronary Balloon Angioplasty;  Surgeon: Troy Sine, MD;  Angiosculpt scoring balloon and PTCA to the OM1 reducing stenosis from 95% to less than 10%  . Colostomy reversal  1981    Social History   Social History  . Marital Status: Married    Spouse Name: N/A  . Number of Children: N/A  . Years of Education: N/A   Occupational History  . Retired     Research officer, political party   Social History Main Topics  . Smoking status: Never Smoker   . Smokeless tobacco: Never Used  . Alcohol Use: No  . Drug Use: No  . Sexual Activity: Not Currently    Birth Control/ Protection: Surgical   Other Topics Concern  . Not on file   Social History Narrative     Filed Vitals:   02/13/15 1443  BP: 134/68  Pulse: 63  Height: 5\' 3"  (1.6 m)  Weight: 151 lb (68.493 kg)    PHYSICAL EXAM General: NAD HEENT: Normal. Neck: No JVD, no thyromegaly. Lungs: Clear to auscultation bilaterally with normal respiratory effort. CV: Nondisplaced PMI.  Regular rate and rhythm, normal S1/S2, no [id number], 1/6 systolic murmur at lower left sternal border. No pretibial or periankle edema.  No carotid bruit.  Normal pedal pulses.  Abdomen: Soft, nontender, no hepatosplenomegaly, no distention.  Neurologic: Alert and oriented x 3.  Psych: Normal affect. Skin: Normal. Musculoskeletal: Normal range of motion, no gross deformities. Extremities: No clubbing or cyanosis.   ECG: Most recent ECG reviewed.      ASSESSMENT AND PLAN: 1. Shortness of breath in context of CAD and h/o MI s/p PTCA of OM1: Stable ischemic heart disease. Due to shortness of  breath, will d/c Brilinta and switch to Plavix. Continue ASA, Lipitor, labetalol, and losartan.  2. Essential HTN: Controlled on present therapy. Continue labetalol, losartan, and amlodipine.  3. Hyperlipidemia: Continue Lipitor 20 mg daily and will add fish oil for elevated triglycerides.  4. Daytime somnolence and fatigue: She was scheduled by me to have a sleep study but did not have it last year.  5. PVD: Stable.  6. Leg swelling: Will prescribe 20 mg Lasix and 10 meq KCl prn.  Dispo: f/u 3 months.   Kate Sable, M.D., F.A.C.C.

## 2015-02-15 ENCOUNTER — Emergency Department (HOSPITAL_COMMUNITY): Payer: PPO

## 2015-02-15 ENCOUNTER — Encounter (HOSPITAL_COMMUNITY)
Admission: RE | Admit: 2015-02-15 | Discharge: 2015-02-15 | Disposition: A | Discharge status: Home / Self Care | Payer: PPO | Source: Ambulatory Visit | Attending: Cardiovascular Disease | Admitting: Cardiovascular Disease

## 2015-02-15 ENCOUNTER — Encounter (HOSPITAL_COMMUNITY): Payer: Self-pay | Admitting: *Deleted

## 2015-02-15 ENCOUNTER — Other Ambulatory Visit: Payer: Self-pay

## 2015-02-15 ENCOUNTER — Emergency Department (HOSPITAL_COMMUNITY)
Admission: EM | Admit: 2015-02-15 | Discharge: 2015-02-15 | Disposition: A | Discharge status: Home / Self Care | Payer: PPO | Attending: Emergency Medicine | Admitting: Emergency Medicine

## 2015-02-15 DIAGNOSIS — Z8719 Personal history of other diseases of the digestive system: Secondary | ICD-10-CM | POA: Insufficient documentation

## 2015-02-15 DIAGNOSIS — R202 Paresthesia of skin: Secondary | ICD-10-CM | POA: Insufficient documentation

## 2015-02-15 DIAGNOSIS — Z8619 Personal history of other infectious and parasitic diseases: Secondary | ICD-10-CM | POA: Insufficient documentation

## 2015-02-15 DIAGNOSIS — E119 Type 2 diabetes mellitus without complications: Secondary | ICD-10-CM | POA: Insufficient documentation

## 2015-02-15 DIAGNOSIS — Z87440 Personal history of urinary (tract) infections: Secondary | ICD-10-CM | POA: Insufficient documentation

## 2015-02-15 DIAGNOSIS — Z880 Allergy status to penicillin: Secondary | ICD-10-CM | POA: Diagnosis not present

## 2015-02-15 DIAGNOSIS — I252 Old myocardial infarction: Secondary | ICD-10-CM | POA: Diagnosis not present

## 2015-02-15 DIAGNOSIS — Z86018 Personal history of other benign neoplasm: Secondary | ICD-10-CM | POA: Insufficient documentation

## 2015-02-15 DIAGNOSIS — Z9861 Coronary angioplasty status: Secondary | ICD-10-CM | POA: Insufficient documentation

## 2015-02-15 DIAGNOSIS — H538 Other visual disturbances: Secondary | ICD-10-CM | POA: Insufficient documentation

## 2015-02-15 DIAGNOSIS — I10 Essential (primary) hypertension: Secondary | ICD-10-CM | POA: Insufficient documentation

## 2015-02-15 DIAGNOSIS — Z8739 Personal history of other diseases of the musculoskeletal system and connective tissue: Secondary | ICD-10-CM | POA: Insufficient documentation

## 2015-02-15 DIAGNOSIS — Z794 Long term (current) use of insulin: Secondary | ICD-10-CM | POA: Insufficient documentation

## 2015-02-15 DIAGNOSIS — E039 Hypothyroidism, unspecified: Secondary | ICD-10-CM | POA: Insufficient documentation

## 2015-02-15 DIAGNOSIS — R200 Anesthesia of skin: Secondary | ICD-10-CM | POA: Diagnosis present

## 2015-02-15 DIAGNOSIS — Z9889 Other specified postprocedural states: Secondary | ICD-10-CM | POA: Diagnosis not present

## 2015-02-15 DIAGNOSIS — Z7902 Long term (current) use of antithrombotics/antiplatelets: Secondary | ICD-10-CM | POA: Diagnosis not present

## 2015-02-15 DIAGNOSIS — Z7982 Long term (current) use of aspirin: Secondary | ICD-10-CM | POA: Diagnosis not present

## 2015-02-15 DIAGNOSIS — I213 ST elevation (STEMI) myocardial infarction of unspecified site: Secondary | ICD-10-CM | POA: Diagnosis not present

## 2015-02-15 DIAGNOSIS — I2510 Atherosclerotic heart disease of native coronary artery without angina pectoris: Secondary | ICD-10-CM | POA: Insufficient documentation

## 2015-02-15 DIAGNOSIS — Z8673 Personal history of transient ischemic attack (TIA), and cerebral infarction without residual deficits: Secondary | ICD-10-CM | POA: Insufficient documentation

## 2015-02-15 LAB — ETHANOL: Alcohol, Ethyl (B): <5 mg/dL (ref <5)

## 2015-02-15 LAB — APTT: APTT: 30 s (ref 24–37)

## 2015-02-15 LAB — DIFFERENTIAL
BASOS ABS: 0 10*3/uL (ref 0.0–0.1)
Basophils Relative: 0 % (ref 0–1)
Eosinophils Absolute: 0.2 10*3/uL (ref 0.0–0.7)
Eosinophils Relative: 2 % (ref 0–5)
LYMPHS ABS: 1.6 10*3/uL (ref 0.7–4.0)
LYMPHS PCT: 17 % (ref 12–46)
MONOS PCT: 6 % (ref 3–12)
Monocytes Absolute: 0.6 10*3/uL (ref 0.1–1.0)
NEUTROS ABS: 6.9 10*3/uL (ref 1.7–7.7)
Neutrophils Relative %: 75 % (ref 43–77)

## 2015-02-15 LAB — RAPID URINE DRUG SCREEN, HOSP PERFORMED
Amphetamines: NOT DETECTED
BARBITURATES: NOT DETECTED
BENZODIAZEPINES: NOT DETECTED
Cocaine: NOT DETECTED
Opiates: NOT DETECTED
TETRAHYDROCANNABINOL: NOT DETECTED

## 2015-02-15 LAB — URINALYSIS, ROUTINE W REFLEX MICROSCOPIC
BILIRUBIN URINE: NEGATIVE
GLUCOSE, UA: NEGATIVE mg/dL
Hgb urine dipstick: NEGATIVE
KETONES UR: NEGATIVE mg/dL
NITRITE: NEGATIVE
Specific Gravity, Urine: 1.01 (ref 1.005–1.030)
Urobilinogen, UA: 0.2 mg/dL (ref 0.0–1.0)
pH: 6 (ref 5.0–8.0)

## 2015-02-15 LAB — COMPREHENSIVE METABOLIC PANEL
ALK PHOS: 65 U/L (ref 38–126)
ALT: 16 U/L (ref 14–54)
AST: 14 U/L — AB (ref 15–41)
Albumin: 4 g/dL (ref 3.5–5.0)
Anion gap: 7 (ref 5–15)
BILIRUBIN TOTAL: 0.6 mg/dL (ref 0.3–1.2)
BUN: 14 mg/dL (ref 6–20)
CO2: 23 mmol/L (ref 22–32)
CREATININE: 0.93 mg/dL (ref 0.44–1.00)
Calcium: 8.9 mg/dL (ref 8.9–10.3)
Chloride: 109 mmol/L (ref 101–111)
GFR calc Af Amer: >60 mL/min (ref >60)
GLUCOSE: 137 mg/dL — AB (ref 65–99)
Potassium: 4.5 mmol/L (ref 3.5–5.1)
Sodium: 139 mmol/L (ref 135–145)
TOTAL PROTEIN: 6.4 g/dL — AB (ref 6.5–8.1)

## 2015-02-15 LAB — CBC
HEMATOCRIT: 34.9 % — AB (ref 36.0–46.0)
HEMOGLOBIN: 11.8 g/dL — AB (ref 12.0–15.0)
MCH: 30.5 pg (ref 26.0–34.0)
MCHC: 33.8 g/dL (ref 30.0–36.0)
MCV: 90.2 fL (ref 78.0–100.0)
Platelets: 212 10*3/uL (ref 150–400)
RBC: 3.87 MIL/uL (ref 3.87–5.11)
RDW: 14 % (ref 11.5–15.5)
WBC: 9.3 10*3/uL (ref 4.0–10.5)

## 2015-02-15 LAB — PROTIME-INR
INR: 0.97 (ref 0.00–1.49)
Prothrombin Time: 13.1 seconds (ref 11.6–15.2)

## 2015-02-15 LAB — I-STAT CHEM 8, ED
BUN: 13 mg/dL (ref 6–20)
CREATININE: 0.9 mg/dL (ref 0.44–1.00)
Calcium, Ion: 1.21 mmol/L (ref 1.13–1.30)
Chloride: 107 mmol/L (ref 101–111)
Glucose, Bld: 131 mg/dL — ABNORMAL HIGH (ref 65–99)
HCT: 35 % — ABNORMAL LOW (ref 36.0–46.0)
HEMOGLOBIN: 11.9 g/dL — AB (ref 12.0–15.0)
POTASSIUM: 4.4 mmol/L (ref 3.5–5.1)
Sodium: 140 mmol/L (ref 135–145)
TCO2: 22 mmol/L (ref 0–100)

## 2015-02-15 LAB — I-STAT TROPONIN, ED: TROPONIN I, POC: 0.01 ng/mL (ref 0.00–0.08)

## 2015-02-15 LAB — CBG MONITORING, ED: Glucose-Capillary: 151 mg/dL — ABNORMAL HIGH (ref 65–99)

## 2015-02-15 LAB — URINE MICROSCOPIC-ADD ON

## 2015-02-15 MED ORDER — CEPHALEXIN 500 MG PO CAPS: 500.0000 mg | ORAL_CAPSULE | Freq: Two times a day (BID) | ORAL | Status: DC

## 2015-02-15 MED ORDER — IOHEXOL 350 MG/ML SOLN
100.0000 mL | Freq: Once | INTRAVENOUS | Status: AC | PRN
  Administered 2015-02-15: 75 mL via INTRAVENOUS

## 2015-02-15 MED ORDER — CEPHALEXIN 500 MG PO CAPS
500.0000 mg | ORAL_CAPSULE | Freq: Once | ORAL | Status: AC
  Administered 2015-02-15: 500 mg via ORAL
  Filled 2015-02-15: qty 1

## 2015-02-15 NOTE — ED Notes (Signed)
Pt with numbness to left side of face about 30 minutes ago, denies numbness at this time since here, pt denies any pain, pt denies trouble swallowing, states numbness had radiated down left arm

## 2015-02-15 NOTE — ED Provider Notes (Signed)
I received this patient in signout from Dr. Dayna Barker. Per his report, patient presented w/ transient episode of blurry vision in her right eye later followed by left facial numbness. All symptoms have resolved. Workup for TIA was negative and awaiting results of CTA of head and neck.  IMPRESSION: No acute infarct. Small chronic lacunar infarction left thalamus.  Intracranial atherosclerotic disease. 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 Mild to moderate stenosis right M1 bifurcation. Moderate stenosis right posterior cerebral artery.  Mild atherosclerotic disease in the carotid and vertebral arteries in the neck without flow limiting stenosis. 25% right internal carotid artery stenosis.   Electronically Signed By: Franchot Gallo M.D. On: 02/15/2015 17:10   CTA shows scattered mild to moderate stenosis and 25% right ICA stenosis with no acute infarcts or intervene of the lesions. I discussed the results with Dr. Merlene Laughter, who agreed that patient was stable for d/c home given resolution of sx. She is currently on optimal medical therapy w/ statin, ASA, and plavix. Discussed the results with the patient and her husband as well as importance of follow-up w/ neurology. I reviewed return precautions including any reappearance of neurologic symptoms. They voiced understanding and patient discharged in satisfactory condition.  Sharlett Iles, MD 02/15/15 [phone]

## 2015-02-15 NOTE — ED Provider Notes (Signed)
CSN: [id number]     Arrival date & time 02/15/15  1239 History   First MD Initiated Contact with Patient 02/15/15 1243     Chief Complaint  Patient presents with  . Numbness     (Consider location/radiation/quality/duration/timing/severity/associated sxs/prior Treatment) HPI  69 year old female here with a transient paresthesia or numbness to her left cheek happened just prior to arrival has resolved now. No history of the same. Does have a recent history of cardiac catheterization and balloon angioplasty. Happened after cardiac rehabilitation she had some short-lived right eye blurry vision. This went away for 10-15 minutes and then had onset of left face numbness and paresthesia. No motor weakness during that time no change in vision during that time. Resolved quickly prior to arriving here.  Past Medical History  Diagnosis Date  . HTN (hypertension)   . DM (diabetes mellitus)   . Heart block   . Hypothyroidism   . Ovarian tumor   . Lupus   . S/P colonoscopy 2005    Dr. Laural Golden: pancolonic divericula, polyp, path unknown currently  . Coronary artery disease 11/30/2014    PCI to the OM1  . UTI (urinary tract infection) 05/08/2013  . Superficial fungus infection of skin 06/29/2013  . Myocardial infarction 02/2012  . TIA (transient ischemic attack)   . GERD (gastroesophageal reflux disease)   . Arthritis    Past Surgical History  Procedure Laterality Date  . Cholecystectomy    . Colostomy      then reversed  . Partial hysterectomy      left ovaries, then ovaries removed later due tumors   . Oophorectomy      1980, nicked bowel, peritonitis, colostomy, now colostomy reversed   . Eye surgeries    . Cardiac catheterization  2008    Tiny OM-2 with 90% narrowing. Med tx.  . Nuclear med stress test  10/2011    Small area of mild ischemia inferoapically.  . Abdominal aortagram N/A 01/03/2014    Procedure: ABDOMINAL [name];  Surgeon: Wellington Hampshire, MD;  Location: [facility] CATH LAB;   Service: Cardiovascular;  Laterality: N/A;  . Lower extremity angiogram N/A 01/03/2014    Procedure: LOWER EXTREMITY ANGIOGRAM;  Surgeon: Wellington Hampshire, MD;  Location: [facility] CATH LAB;  Service: Cardiovascular;  Laterality: N/A;  . Cardiac catheterization N/A 11/30/2014    Procedure: Left Heart Cath and Coronary Angiography;  Surgeon: Troy Sine, MD; LAD 20%, CFX 50%, OM1 95%, right PLB 30%, LV normal   . Cardiac catheterization N/A 11/30/2014    Procedure: Coronary Balloon Angioplasty;  Surgeon: Troy Sine, MD;  Angiosculpt scoring balloon and PTCA to the OM1 reducing stenosis from 95% to less than 10%  . Colostomy reversal  1981   Family History  Problem Relation Age of Onset  . Heart disease Mother     deceased  . Heart disease Father     deceased, heart disease  . Colon cancer Neg Hx   . Diabetes Brother   . Heart disease Brother   . Thyroid disease Brother   . Heart disease Sister   . Heart disease Brother   . Thyroid disease Brother    Social History  Substance Use Topics  . Smoking status: Never Smoker   . Smokeless tobacco: Never Used  . Alcohol Use: No   OB History    Gravida Para Term Preterm AB TAB SAB Ectopic Multiple Living   3 2   1  1    2  Review of Systems  Constitutional: Negative for fever and diaphoresis.  Eyes: Positive for visual disturbance.  Respiratory: Negative for shortness of breath.   Cardiovascular: Negative for chest pain.  Gastrointestinal: Negative for nausea and vomiting.  Skin: Negative for wound.  Neurological: Positive for numbness. Negative for facial asymmetry, speech difficulty, weakness and light-headedness.      Allergies  Penicillins and Percocet  Home Medications   Prior to Admission medications   Medication Sig Start Date End Date Taking? Authorizing Provider  amLODipine (NORVASC) 10 MG tablet TAKE ONE (1) TABLET EACH DAY 07/24/14   Herminio Commons, MD  aspirin EC 81 MG tablet Take 1 tablet (81 mg total) by  mouth daily. 01/30/14   Wellington Hampshire, MD  atorvastatin (LIPITOR) 10 MG tablet Take 1 tablet (10 mg total) by mouth daily. 02/13/15   Herminio Commons, MD  cholecalciferol (VITAMIN D) 1000 UNITS tablet Take 1,000 Units by mouth daily.    Historical Provider, MD  clopidogrel (PLAVIX) 75 MG tablet Take 1 tablet (75 mg total) by mouth daily. 02/13/15   Herminio Commons, MD  furosemide (LASIX) 20 MG tablet Take 1 tablet (20 mg total) by mouth as needed for edema (leg swelling). 02/13/15   Herminio Commons, MD  insulin NPH Human (HUMULIN N,NOVOLIN N) 100 UNIT/ML injection Inject 30-35 Units into the skin 2 (two) times daily before a meal. *per sliding scale*    Historical Provider, MD  labetalol (NORMODYNE) 300 MG tablet TAKE ONE TABLET TWICE DAILY 04/23/14   Herminio Commons, MD  levothyroxine (SYNTHROID, LEVOTHROID) 137 MCG tablet Take 137 mcg by mouth daily before breakfast.    Historical Provider, MD  losartan (COZAAR) 100 MG tablet TAKE ONE (1) TABLET BY MOUTH EVERY DAY 06/04/14   Herminio Commons, MD  metFORMIN (GLUCOPHAGE) 500 MG tablet Take 500-1,000 mg by mouth 2 (two) times daily with a meal. Takes 1000mg  in the morning and 500mg  at night    Historical Provider, MD  nitroGLYCERIN (NITROSTAT) 0.4 MG SL tablet Place 0.4 mg under the tongue every 5 (five) minutes as needed for chest pain.    Historical Provider, MD  Omega-3 Fatty Acids (FISH OIL) 1200 MG CAPS Take 1 capsule (1,200 mg total) by mouth daily. 02/13/15   Herminio Commons, MD  potassium chloride (K-DUR) 10 MEQ tablet Take 1 tablet (10 mEq total) by mouth as needed (on days that you take your Lasix). 02/13/15   Herminio Commons, MD  rOPINIRole (REQUIP) 0.5 MG tablet Take 0.5 mg by mouth at bedtime.  03/05/14   Historical Provider, MD   BP 156/78 mmHg  Pulse 75  Temp(Src) 97.7 F (36.5 C) (Oral)  Resp 18  Ht 5\' 3"  (1.6 m)  Wt 151 lb (68.493 kg)  BMI 26.76 kg/m2  SpO2 99% Physical Exam  Constitutional: She is  oriented to person, place, and time. She appears well-developed and well-nourished.  HENT:  Head: Normocephalic and atraumatic.  Eyes: Conjunctivae and EOM are normal. Right eye exhibits no discharge. Left eye exhibits no discharge.  Cardiovascular: Normal rate and regular rhythm.   Pulmonary/Chest: [place] normal and breath sounds normal. No respiratory distress.  Abdominal: Soft. She exhibits no distension. There is no tenderness. There is no rebound.  Musculoskeletal: Normal range of motion. She exhibits no edema or tenderness.  Neurological: She is alert and oriented to person, place, and time.  No altered mental status, able to give full seemingly accurate history.  Face is symmetric,  EOM's intact but has chronic strabismus, pupils equal and reactive, vision intact, tongue and uvula midline without deviation Upper and Lower extremity motor 5/5, intact pain perception in distal extremities, 2+ reflexes in biceps, patella and achilles tendons. Finger to nose normal, heel to shin normal. Walks without assistance or evident ataxia but does have a limp (baseline per husband).   Skin: Skin is warm and dry.  Nursing note and vitals reviewed.   ED Course  Procedures (including critical care time) Labs Review Labs Reviewed  ETHANOL  PROTIME-INR  APTT  CBC  DIFFERENTIAL  COMPREHENSIVE METABOLIC PANEL  URINE RAPID DRUG SCREEN, HOSP PERFORMED  URINALYSIS, ROUTINE W REFLEX MICROSCOPIC (NOT AT [facility])  CBG MONITORING, ED  I-STAT CHEM 8, ED  I-STAT TROPOININ, ED    Imaging Review No results found. I have personally reviewed and evaluated these images and lab results as part of my medical decision-making.   EKG Interpretation None      MDM   Final diagnoses:  None   69 year old female transient numbness of her left cheek. Possibly TIA. Also had episode of blurry vision or right eye, I doubt this was amaurosis fugax as she denies visual loss, however is still concern for possible  ischemic episode. So labs revealed a urinary tract infection and everything else was normal without any evidence of head bleed. I discussed the case with neurology they're requesting a CTA of the head and neck to evaluate for any carotid or vascular disease. Will disposition based on Neurology recommendations after CT scan.     Merrily Pew, MD 02/15/15 [phone]

## 2015-02-15 NOTE — ED Notes (Signed)
MD at bedside. 

## 2015-02-15 NOTE — ED Notes (Signed)
Ambulatory to restroom at this time without difficulty.

## 2015-02-18 ENCOUNTER — Encounter (HOSPITAL_COMMUNITY)
Admission: RE | Admit: 2015-02-18 | Discharge: 2015-02-18 | Disposition: A | Discharge status: Home / Self Care | Payer: PPO | Source: Ambulatory Visit | Attending: Cardiovascular Disease | Admitting: Cardiovascular Disease

## 2015-02-18 DIAGNOSIS — I213 ST elevation (STEMI) myocardial infarction of unspecified site: Secondary | ICD-10-CM | POA: Diagnosis not present

## 2015-02-20 ENCOUNTER — Encounter (HOSPITAL_COMMUNITY)
Admission: RE | Admit: 2015-02-20 | Discharge: 2015-02-20 | Disposition: A | Discharge status: Home / Self Care | Payer: PPO | Source: Ambulatory Visit | Attending: Cardiovascular Disease | Admitting: Cardiovascular Disease

## 2015-02-20 DIAGNOSIS — I213 ST elevation (STEMI) myocardial infarction of unspecified site: Secondary | ICD-10-CM | POA: Diagnosis not present

## 2015-02-22 ENCOUNTER — Encounter (HOSPITAL_COMMUNITY)
Admission: RE | Admit: 2015-02-22 | Discharge: 2015-02-22 | Disposition: A | Discharge status: Home / Self Care | Payer: PPO | Source: Ambulatory Visit | Attending: Cardiovascular Disease | Admitting: Cardiovascular Disease

## 2015-02-22 ENCOUNTER — Ambulatory Visit: Payer: PPO | Admitting: Cardiovascular Disease

## 2015-02-22 DIAGNOSIS — Z9861 Coronary angioplasty status: Secondary | ICD-10-CM | POA: Insufficient documentation

## 2015-02-22 DIAGNOSIS — I213 ST elevation (STEMI) myocardial infarction of unspecified site: Secondary | ICD-10-CM | POA: Insufficient documentation

## 2015-02-22 DIAGNOSIS — I2510 Atherosclerotic heart disease of native coronary artery without angina pectoris: Secondary | ICD-10-CM | POA: Diagnosis not present

## 2015-02-25 ENCOUNTER — Encounter (HOSPITAL_COMMUNITY): Payer: PPO

## 2015-02-27 ENCOUNTER — Encounter (HOSPITAL_COMMUNITY): Payer: PPO

## 2015-03-01 ENCOUNTER — Encounter (HOSPITAL_COMMUNITY)
Admission: RE | Admit: 2015-03-01 | Discharge: 2015-03-01 | Disposition: A | Discharge status: Home / Self Care | Payer: PPO | Source: Ambulatory Visit | Attending: Cardiovascular Disease | Admitting: Cardiovascular Disease

## 2015-03-01 DIAGNOSIS — I213 ST elevation (STEMI) myocardial infarction of unspecified site: Secondary | ICD-10-CM | POA: Diagnosis not present

## 2015-03-04 ENCOUNTER — Encounter (HOSPITAL_COMMUNITY)
Admission: RE | Admit: 2015-03-04 | Discharge: 2015-03-04 | Disposition: A | Discharge status: Home / Self Care | Payer: PPO | Source: Ambulatory Visit | Attending: Cardiovascular Disease | Admitting: Cardiovascular Disease

## 2015-03-04 DIAGNOSIS — I213 ST elevation (STEMI) myocardial infarction of unspecified site: Secondary | ICD-10-CM | POA: Diagnosis not present

## 2015-03-06 ENCOUNTER — Encounter (HOSPITAL_COMMUNITY): Payer: PPO

## 2015-03-08 ENCOUNTER — Encounter (HOSPITAL_COMMUNITY)
Admission: RE | Admit: 2015-03-08 | Discharge: 2015-03-08 | Disposition: A | Discharge status: Home / Self Care | Payer: PPO | Source: Ambulatory Visit | Attending: Cardiovascular Disease | Admitting: Cardiovascular Disease

## 2015-03-08 DIAGNOSIS — I213 ST elevation (STEMI) myocardial infarction of unspecified site: Secondary | ICD-10-CM | POA: Diagnosis not present

## 2015-03-11 ENCOUNTER — Encounter (HOSPITAL_COMMUNITY)
Admission: RE | Admit: 2015-03-11 | Discharge: 2015-03-11 | Disposition: A | Discharge status: Home / Self Care | Payer: PPO | Source: Ambulatory Visit | Attending: Cardiovascular Disease | Admitting: Cardiovascular Disease

## 2015-03-11 DIAGNOSIS — I213 ST elevation (STEMI) myocardial infarction of unspecified site: Secondary | ICD-10-CM | POA: Diagnosis not present

## 2015-03-13 ENCOUNTER — Encounter (HOSPITAL_COMMUNITY)
Admission: RE | Admit: 2015-03-13 | Discharge: 2015-03-13 | Disposition: A | Discharge status: Home / Self Care | Payer: PPO | Source: Ambulatory Visit | Attending: Cardiovascular Disease | Admitting: Cardiovascular Disease

## 2015-03-13 DIAGNOSIS — I213 ST elevation (STEMI) myocardial infarction of unspecified site: Secondary | ICD-10-CM | POA: Diagnosis not present

## 2015-03-14 ENCOUNTER — Ambulatory Visit: Payer: PPO | Admitting: Cardiovascular Disease

## 2015-03-15 ENCOUNTER — Encounter (HOSPITAL_COMMUNITY)
Admission: RE | Admit: 2015-03-15 | Discharge: 2015-03-15 | Disposition: A | Discharge status: Home / Self Care | Payer: PPO | Source: Ambulatory Visit | Attending: Cardiovascular Disease | Admitting: Cardiovascular Disease

## 2015-03-15 DIAGNOSIS — I213 ST elevation (STEMI) myocardial infarction of unspecified site: Secondary | ICD-10-CM | POA: Diagnosis not present

## 2015-03-18 ENCOUNTER — Encounter (HOSPITAL_COMMUNITY): Payer: PPO

## 2015-03-20 ENCOUNTER — Encounter (HOSPITAL_COMMUNITY): Payer: PPO

## 2015-03-22 ENCOUNTER — Encounter (HOSPITAL_COMMUNITY): Payer: PPO

## 2015-03-25 ENCOUNTER — Encounter (HOSPITAL_COMMUNITY)
Admission: RE | Admit: 2015-03-25 | Discharge: 2015-03-25 | Disposition: A | Discharge status: Home / Self Care | Payer: PPO | Source: Ambulatory Visit | Attending: Cardiovascular Disease | Admitting: Cardiovascular Disease

## 2015-03-25 DIAGNOSIS — Z9861 Coronary angioplasty status: Secondary | ICD-10-CM | POA: Insufficient documentation

## 2015-03-25 DIAGNOSIS — I2510 Atherosclerotic heart disease of native coronary artery without angina pectoris: Secondary | ICD-10-CM | POA: Insufficient documentation

## 2015-03-25 DIAGNOSIS — I213 ST elevation (STEMI) myocardial infarction of unspecified site: Secondary | ICD-10-CM | POA: Diagnosis not present

## 2015-03-27 ENCOUNTER — Encounter (HOSPITAL_COMMUNITY)
Admission: RE | Admit: 2015-03-27 | Discharge: 2015-03-27 | Disposition: A | Discharge status: Home / Self Care | Payer: PPO | Source: Ambulatory Visit | Attending: Cardiovascular Disease | Admitting: Cardiovascular Disease

## 2015-03-27 DIAGNOSIS — I213 ST elevation (STEMI) myocardial infarction of unspecified site: Secondary | ICD-10-CM | POA: Diagnosis not present

## 2015-03-29 ENCOUNTER — Encounter: Payer: Self-pay | Admitting: Adult Health

## 2015-03-29 ENCOUNTER — Encounter (HOSPITAL_COMMUNITY)
Admission: RE | Admit: 2015-03-29 | Discharge: 2015-03-29 | Disposition: A | Discharge status: Home / Self Care | Payer: PPO | Source: Ambulatory Visit | Attending: Cardiovascular Disease | Admitting: Cardiovascular Disease

## 2015-03-29 ENCOUNTER — Ambulatory Visit (INDEPENDENT_AMBULATORY_CARE_PROVIDER_SITE_OTHER): Payer: PPO | Admitting: Adult Health

## 2015-03-29 VITALS — BP 144/78 | HR 70 | Ht 63.0 in | Wt 151.0 lb

## 2015-03-29 DIAGNOSIS — R079 Chest pain, unspecified: Secondary | ICD-10-CM | POA: Diagnosis not present

## 2015-03-29 DIAGNOSIS — I213 ST elevation (STEMI) myocardial infarction of unspecified site: Secondary | ICD-10-CM | POA: Diagnosis not present

## 2015-03-29 DIAGNOSIS — R072 Precordial pain: Secondary | ICD-10-CM

## 2015-03-29 DIAGNOSIS — I10 Essential (primary) hypertension: Secondary | ICD-10-CM

## 2015-03-29 DIAGNOSIS — I2510 Atherosclerotic heart disease of native coronary artery without angina pectoris: Secondary | ICD-10-CM | POA: Diagnosis not present

## 2015-03-29 MED ORDER — PANTOPRAZOLE SODIUM 40 MG PO TBEC: 40.0000 mg | DELAYED_RELEASE_TABLET | Freq: Every day | ORAL | Status: DC

## 2015-03-29 NOTE — Patient Instructions (Signed)
Your physician recommends that you schedule a follow-up appointment in: 2 Weeks with Jory Sims, NP.  Your physician has recommended you make the following change in your medication:   Start: Protonix 40 mg Daily  You have been given samples of Ranexa 500 mg   Thank you for choosing [facility]!

## 2015-03-29 NOTE — Progress Notes (Signed)
Cardiology Office Note   Date:  03/29/2015   ID:  Luma, [place] [birth year], MRN [id number]  PCP:  Purvis Kilts, MD  Cardiologist:  Woodroe Chen, NP   Chief Complaint  Patient presents with  . Chest Pain  . Hypertension      History of Present Illness: Virginia Huber is a 69 y.o. female who presents for ongoing assessment and management of CAD and MI. She underwent successful PCI to the first obtuse marginal branch of the circumflex which was 95% stenosed treated with PTCA/and juice sculpt scoring balloon with the stenosis reduced to less than 10% on 11/30/14. She had 50% proximal to mid circumflex stenosis as well.  Atenolol at cardiac rehabilitation.  The patient began to have chest pain, substernal burning.  This was occurring during exercise.  She was given nitroglycerin sublingual, and pain dissipated.  The patient described it as also, achy pain.  She states that the pain was similar to the pain.  She had prior to her angioplasty.  During the event.  She was mildly hypertensive with a blood pressure of 145/85.  After nitroglycerin.  Blood pressure dropped into 120/70 range and she felt better.  On further discussion, the patient, states she has been having burning, pain, after eating to 3 times this week.  Most often in the evening.  She notices it most when she lies down.  She has not been seen by GI specialist, and while and has not had an EGD.   Past Medical History  Diagnosis Date  . HTN (hypertension)   . DM (diabetes mellitus) ([place])   . Heart block   . Hypothyroidism   . Ovarian tumor   . Lupus ([place])   . S/P colonoscopy 2005    Dr. Laural Golden: pancolonic divericula, polyp, path unknown currently  . Coronary artery disease 11/30/2014    PCI to the OM1  . UTI (urinary tract infection) 05/08/2013  . Superficial fungus infection of skin 06/29/2013  . Myocardial infarction ([place]) 02/2012  . TIA (transient ischemic attack)   . GERD (gastroesophageal  reflux disease)   . Arthritis     Past Surgical History  Procedure Laterality Date  . Cholecystectomy    . Colostomy      then reversed  . Partial hysterectomy      left ovaries, then ovaries removed later due tumors   . Oophorectomy      1980, nicked bowel, peritonitis, colostomy, now colostomy reversed   . Eye surgeries    . Cardiac catheterization  2008    Tiny OM-2 with 90% narrowing. Med tx.  . Nuclear med stress test  10/2011    Small area of mild ischemia inferoapically.  . Abdominal aortagram N/A 01/03/2014    Procedure: ABDOMINAL [name];  Surgeon: Wellington Hampshire, MD;  Location: [facility] CATH LAB;  Service: Cardiovascular;  Laterality: N/A;  . Lower extremity angiogram N/A 01/03/2014    Procedure: LOWER EXTREMITY ANGIOGRAM;  Surgeon: Wellington Hampshire, MD;  Location: [facility] CATH LAB;  Service: Cardiovascular;  Laterality: N/A;  . Cardiac catheterization N/A 11/30/2014    Procedure: Left Heart Cath and Coronary Angiography;  Surgeon: Troy Sine, MD; LAD 20%, CFX 50%, OM1 95%, right PLB 30%, LV normal   . Cardiac catheterization N/A 11/30/2014    Procedure: Coronary Balloon Angioplasty;  Surgeon: Troy Sine, MD;  Angiosculpt scoring balloon and PTCA to the OM1 reducing stenosis from 95% to less than 10%  . Colostomy reversal  1981     Current Outpatient Prescriptions  Medication Sig Dispense Refill  . amLODipine (NORVASC) 10 MG tablet TAKE ONE (1) TABLET EACH DAY 90 tablet 3  . aspirin EC 81 MG tablet Take 1 tablet (81 mg total) by mouth daily.    [name] atorvastatin (LIPITOR) 10 MG tablet Take 1 tablet (10 mg total) by mouth daily. 90 tablet 3  . cephALEXin (KEFLEX) 500 MG capsule Take 1 capsule (500 mg total) by mouth 2 (two) times daily. 14 capsule 0  . cholecalciferol (VITAMIN D) 1000 UNITS tablet Take 1,000 Units by mouth daily.    . clopidogrel (PLAVIX) 75 MG tablet Take 1 tablet (75 mg total) by mouth daily. 30 tablet 6  . furosemide (LASIX) 20 MG tablet Take 1 tablet (20  mg total) by mouth as needed for edema (leg swelling). 30 tablet 3  . insulin NPH Human (HUMULIN N,NOVOLIN N) 100 UNIT/ML injection Inject 30-35 Units into the skin 2 (two) times daily before a meal. *per sliding scale*    . labetalol (NORMODYNE) 300 MG tablet TAKE ONE TABLET TWICE DAILY 180 tablet 3  . levothyroxine (SYNTHROID, LEVOTHROID) 137 MCG tablet Take 137 mcg by mouth daily before breakfast.    . losartan (COZAAR) 100 MG tablet TAKE ONE (1) TABLET BY MOUTH EVERY DAY 90 tablet 3  . metFORMIN (GLUCOPHAGE) 500 MG tablet Take 500-1,000 mg by mouth 2 (two) times daily with a meal. Takes 1000mg  in the morning and 500mg  at night    . nitroGLYCERIN (NITROSTAT) 0.4 MG SL tablet Place 0.4 mg under the tongue every 5 (five) minutes as needed for chest pain.    . Omega-3 Fatty Acids (FISH OIL) 1200 MG CAPS Take 1 capsule (1,200 mg total) by mouth daily.    . potassium chloride (K-DUR) 10 MEQ tablet Take 1 tablet (10 mEq total) by mouth as needed (on days that you take your Lasix). 30 tablet 3  . rOPINIRole (REQUIP) 0.5 MG tablet Take 0.5 mg by mouth at bedtime.   0  . pantoprazole (PROTONIX) 40 MG tablet Take 1 tablet (40 mg total) by mouth daily. 30 tablet 11   No current facility-administered medications for this visit.    Allergies:   Penicillins and Percocet    Social History:  The patient  reports that she has never smoked. She has never used smokeless tobacco. She reports that she does not drink alcohol or use illicit drugs.   Family History:  The patient's family history includes Diabetes in her brother; Heart disease in her brother, brother, father, mother, and sister; Thyroid disease in her brother and brother. There is no history of Colon cancer.    ROS: All other systems are reviewed and negative. Unless otherwise mentioned in H&P    PHYSICAL EXAM: VS:  BP 144/78 mmHg  Pulse 70  Ht 5\' 3"  (1.6 m)  Wt 151 lb (68.493 kg)  BMI 26.76 kg/m2  SpO2 98% , BMI Body mass index is 26.76  kg/(m^2). GEN: Well nourished, well developed, in no acute distress HEENT: normal Neck: no JVD, carotid bruits, or masses Cardiac: RRR; no murmurs, rubs, or gallops,no edema  Respiratory:  clear to auscultation bilaterally, normal work of breathing GI: soft, nontender, nondistended, + BS MS: no deformity or atrophy Skin: warm and dry, no rash Neuro:  Strength and sensation are intact Psych: euthymic mood, full affect   EKG:  The ekg ordered today demonstrates sinus rhythm, with T-wave inversion in V5, V6, and leads 12  and aVF.  Rate of 64 beats per minute.   Recent Labs: 02/15/2015: ALT 16; BUN 13; Creatinine, Ser 0.90; Hemoglobin 11.9*; Platelets 212; Potassium 4.4; Sodium 140    Lipid Panel    Component Value Date/Time   CHOL 127 02/29/2012 0510   TRIG 392* 02/29/2012 0510   HDL 25* 02/29/2012 0510   CHOLHDL 5.1 02/29/2012 0510   VLDL 78* 02/29/2012 0510   LDLCALC 24 02/29/2012 0510      Wt Readings from Last 3 Encounters:  03/29/15 151 lb (68.493 kg)  02/15/15 151 lb (68.493 kg)  02/13/15 151 lb (68.493 kg)      Other studies Reviewed: Additional studies/ records that were reviewed today include: cardiac catheterization completed June 2016. Review of the above records demonstrates:   Dist LAD lesion, 20% stenosed.  2nd RPLB lesion, 30% stenosed.  Prox Cx to Mid Cx lesion, 50% stenosed.  Ost 1st Mrg lesion, 95% stenosed. There is a 10% residual stenosis post intervention.  Normal LV function.  Coronary obstructive disease with smooth 20% narrowing in the mid LAD; 50% stenosis in the AV groove circumflex with 95% ostial stenosis with an upward takeoff of the OM1 vessel arising from this 50% region of narrowing; and dominant RCA with 30% PLA1 stenosis.  Successful PCI to the left circumflex OM1 vessel treated with PTCA/Angiosculpt scoring balloon with the 95% stenosis being reduced to less than 10%. There is brisk TIMI-3 flow. There is no evidence for  dissection.   ASSESSMENT AND PLAN:  1. Chest pain: typical and atypical features.  She describes it as pain, similar to the pain.  She had prior to having her angioplasty.  It is also described as burning, usually after eating, especially when she lies down.  I discussed this with Dr. Bronson Ing on-site.  We have reviewed her recent catheterization.  It is his recommendation that we put her on antianginal medication.  I suggested isosorbide, but she states that it made her blood pressure dropped too low and she felt very bad.  Instead, I will start her on Ranexa 500 mg twice a day, and I have given her samples to try.  I also started her on a low dose PPI, Protonix 40 mg daily.  She has been taking Zantac, but states that it stopped working.  She will see [place] again in 2 weeks to evaluate her status.  She continues to go to cardiac rehabilitation and should she have recurrent chest pain, she will be seen sooner.  2. CAD: I reviewed above.cardiac catheterization with the patient, we will continue current medical management to include atorvastatin, labetalol, (no additional beta blocker,), losartan, and aspirin.  3. Hypertension: The patient will continue on amlodipine, Lasix, and potassium supplement.    Current medicines are reviewed at length with the patient today.    Labs/ tests ordered today include: none.  Orders Placed This Encounter  Procedures  . EKG 12-Lead     Disposition:   FU with  2 weeks. Signed, Jory Sims, NP  03/29/2015 4:17 PM    [facility] [address], [place], [place] [address] Phone: [phone]; Fax: [phone]

## 2015-03-29 NOTE — Progress Notes (Deleted)
Name: Virginia Huber    DOB: March 12, 1946  Age: 69 y.o.  MR#: [id number]       PCP:  Purvis Kilts, MD      Insurance: Payor: [employer] / Plan: Tenn[employer] Must / Product Type: *No Product type* /   CC:   No chief complaint on file.   VS Filed Vitals:   03/29/15 1455  BP: 144/78  Pulse: 70  Height: 5\' 3"  (1.6 m)  Weight: 151 lb (68.493 kg)  SpO2: 98%    Weights Current Weight  03/29/15 151 lb (68.493 kg)  02/15/15 151 lb (68.493 kg)  02/13/15 151 lb (68.493 kg)    Blood Pressure  BP Readings from Last 3 Encounters:  03/29/15 144/78  02/15/15 156/65  02/13/15 134/68     Admit date:  (Not on file) Last encounter with RMR:  12/03/2014   Allergy Penicillins and Percocet  Current Outpatient Prescriptions  Medication Sig Dispense Refill  . amLODipine (NORVASC) 10 MG tablet TAKE ONE (1) TABLET EACH DAY 90 tablet 3  . aspirin EC 81 MG tablet Take 1 tablet (81 mg total) by mouth daily.    [name] atorvastatin (LIPITOR) 10 MG tablet Take 1 tablet (10 mg total) by mouth daily. 90 tablet 3  . cephALEXin (KEFLEX) 500 MG capsule Take 1 capsule (500 mg total) by mouth 2 (two) times daily. 14 capsule 0  . cholecalciferol (VITAMIN D) 1000 UNITS tablet Take 1,000 Units by mouth daily.    . clopidogrel (PLAVIX) 75 MG tablet Take 1 tablet (75 mg total) by mouth daily. 30 tablet 6  . furosemide (LASIX) 20 MG tablet Take 1 tablet (20 mg total) by mouth as needed for edema (leg swelling). 30 tablet 3  . insulin NPH Human (HUMULIN N,NOVOLIN N) 100 UNIT/ML injection Inject 30-35 Units into the skin 2 (two) times daily before a meal. *per sliding scale*    . labetalol (NORMODYNE) 300 MG tablet TAKE ONE TABLET TWICE DAILY 180 tablet 3  . levothyroxine (SYNTHROID, LEVOTHROID) 137 MCG tablet Take 137 mcg by mouth daily before breakfast.    . losartan (COZAAR) 100 MG tablet TAKE ONE (1) TABLET BY MOUTH EVERY DAY 90 tablet 3  . metFORMIN (GLUCOPHAGE) 500 MG tablet Take 500-1,000 mg by  mouth 2 (two) times daily with a meal. Takes 1000mg  in the morning and 500mg  at night    . nitroGLYCERIN (NITROSTAT) 0.4 MG SL tablet Place 0.4 mg under the tongue every 5 (five) minutes as needed for chest pain.    . Omega-3 Fatty Acids (FISH OIL) 1200 MG CAPS Take 1 capsule (1,200 mg total) by mouth daily.    . potassium chloride (K-DUR) 10 MEQ tablet Take 1 tablet (10 mEq total) by mouth as needed (on days that you take your Lasix). 30 tablet 3  . rOPINIRole (REQUIP) 0.5 MG tablet Take 0.5 mg by mouth at bedtime.   0   No current facility-administered medications for this visit.    Discontinued Meds:   There are no discontinued medications.  Patient Active Problem List   Diagnosis Date Noted  . CAD (coronary artery disease), native coronary artery 11/30/2014  . Progressive angina ([place])   . 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 ([place])   . Elevated troponin   . PAD (peripheral artery disease) ([place]) 12/26/2013  . Superficial fungus infection of skin 06/29/2013  . UTI (urinary tract infection) 05/08/2013  . Hypokalemia 03/05/2012  . B12 deficiency  anemia 03/02/2012  . Bronchospasm 03/02/2012  . Community acquired bacterial pneumonia 03/01/2012  . Acute respiratory failure ([place]) 03/01/2012  . DM (diabetes mellitus), type 2, uncontrolled ([place]) 02/29/2012  . Acute non-ST segment elevation myocardial infarction ([place]) 02/29/2012  . Paroxysmal atrial fibrillation ([place]) 02/28/2012  . Stable angina ([place]) 02/28/2012  . Hypothyroid 02/28/2012  . Chest pain on exertion 11/02/2011  . RLQ abdominal pain 11/24/2010  . OVERWEIGHT/OBESITY 06/03/2010  . Essential hypertension 06/03/2010  . Hyperlipidemia 12/27/2009  . CAD, NATIVE VESSEL 05/17/2009  . PALPITATIONS 05/17/2009  . FRACTURE, TOE 12/06/2007    LABS    Component Value Date/Time   NA 140 02/15/2015 1344   NA 139 02/15/2015 1335   NA 138 12/01/2014 0300   K 4.4 02/15/2015 1344    K 4.5 02/15/2015 1335   K 3.7 12/01/2014 0300   CL 107 02/15/2015 1344   CL 109 02/15/2015 1335   CL 105 12/01/2014 0300   CO2 23 02/15/2015 1335   CO2 24 12/01/2014 0300   CO2 22 11/30/2014 0742   GLUCOSE 131* 02/15/2015 1344   GLUCOSE 137* 02/15/2015 1335   GLUCOSE 231* 12/01/2014 0300   BUN 13 02/15/2015 1344   BUN 14 02/15/2015 1335   BUN 12 12/01/2014 0300   CREATININE 0.90 02/15/2015 1344   CREATININE 0.93 02/15/2015 1335   CREATININE 0.84 12/01/2014 0300   CREATININE 1.03 08/28/2011 0146   CREATININE 0.72 11/24/2010 1013   CALCIUM 8.9 02/15/2015 1335   CALCIUM 8.9 12/01/2014 0300   CALCIUM 9.3 11/30/2014 0742   GFRNONAA >60 02/15/2015 1335   GFRNONAA >60 12/01/2014 0300   GFRNONAA >60 11/30/2014 0742   GFRAA >60 02/15/2015 1335   GFRAA >60 12/01/2014 0300   GFRAA >60 11/30/2014 0742   CMP     Component Value Date/Time   NA 140 02/15/2015 1344   K 4.4 02/15/2015 1344   CL 107 02/15/2015 1344   CO2 23 02/15/2015 1335   GLUCOSE 131* 02/15/2015 1344   BUN 13 02/15/2015 1344   CREATININE 0.90 02/15/2015 1344   CREATININE 1.03 08/28/2011 0146   CALCIUM 8.9 02/15/2015 1335   PROT 6.4* 02/15/2015 1335   ALBUMIN 4.0 02/15/2015 1335   AST 14* 02/15/2015 1335   ALT 16 02/15/2015 1335   ALKPHOS 65 02/15/2015 1335   BILITOT 0.6 02/15/2015 1335   GFRNONAA >60 02/15/2015 1335   GFRAA >60 02/15/2015 1335       Component Value Date/Time   WBC 9.3 02/15/2015 1335   WBC 9.3 12/01/2014 0300   WBC 9.8 11/30/2014 0742   HGB 11.9* 02/15/2015 1344   HGB 11.8* 02/15/2015 1335   HGB 12.4 12/01/2014 0300   HCT 35.0* 02/15/2015 1344   HCT 34.9* 02/15/2015 1335   HCT 36.8 12/01/2014 0300   MCV 90.2 02/15/2015 1335   MCV 88.0 12/01/2014 0300   MCV 88.8 11/30/2014 0742    Lipid Panel     Component Value Date/Time   CHOL 127 02/29/2012 0510   TRIG 392* 02/29/2012 0510   HDL 25* 02/29/2012 0510   CHOLHDL 5.1 02/29/2012 0510   VLDL 78* 02/29/2012 0510   LDLCALC 24  02/29/2012 0510    ABG    Component Value Date/Time   PHART 7.415 03/01/2012 2125   PCO2ART 32.4* 03/01/2012 2125   PO2ART 59.8* 03/01/2012 2125   HCO3 20.4 03/01/2012 2125   TCO2 22 02/15/2015 1344   ACIDBASEDEF 3.4* 03/01/2012 2125   O2SAT 93.1 03/01/2012 2125     Lab  Results  Component Value Date   TSH 2.000 02/28/2012   BNP (last 3 results) No results for input(s): BNP in the last 8760 hours.  ProBNP (last 3 results) No results for input(s): PROBNP in the last 8760 hours.  Cardiac Panel (last 3 results) No results for input(s): CKTOTAL, CKMB, TROPONINI, RELINDX in the last 72 hours.  Iron/TIBC/Ferritin/ %Sat    Component Value Date/Time   IRON 16* 03/02/2012 0854   TIBC 211* 03/02/2012 0854   FERRITIN 433* 03/02/2012 0854   IRONPCTSAT 8* 03/02/2012 0854     EKG Orders placed or performed in visit on 03/29/15  . EKG 12-Lead     Prior Assessment and Plan Problem List as of 03/29/2015      Cardiovascular and Mediastinum   CAD, NATIVE VESSEL   Last Assessment & Plan 11/06/2014 Office Visit Written 11/06/2014  5:15 PM by Wellington Hampshire, MD    She has no symptoms suggestive of angina. Continue medical therapy.      Essential hypertension   Last Assessment & Plan 11/06/2014 Office Visit Written 11/06/2014  5:15 PM by Wellington Hampshire, MD    Blood pressure is well controlled on current medication.      Paroxysmal atrial fibrillation [facility])   Last Assessment & Plan 11/01/2012 Office Visit Written 11/01/2012 10:46 AM by Renella Cunas, MD    No clinical recurrence.      Stable angina [facility])   Last Assessment & Plan 04/07/2012 Office Visit Written 04/07/2012 12:13 PM by Renella Cunas, MD    Stable. Continue current medications. Reinforced the need to continue to lose weight and keep her hemoglobin A1c down.      Acute non-ST segment elevation myocardial infarction [facility])   Last Assessment & Plan 04/07/2012 Office Visit Written 04/07/2012 12:14 PM by Renella Cunas, MD     This was secondary to demand ischemia from rapid A. fib and pneumonia. Clinically stable. Reviewed at length with patient and husband.      PAD (peripheral artery disease) [facility])   Last Assessment & Plan 11/06/2014 Office Visit Written 11/06/2014  5:15 PM by Wellington Hampshire, MD    Claudication overall improved with cilostazol and a walking program. Current hip pain seems to be due to bursitis and not PAD. There was no evidence of aortoiliac disease.      Progressive angina (HCC)   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 (HCC)   CAD (coronary artery disease), native coronary artery     Respiratory   Community acquired bacterial pneumonia   Acute respiratory failure (HCC)   Bronchospasm     Endocrine   Hypothyroid   DM (diabetes mellitus), type 2, uncontrolled ([place])     Musculoskeletal and Integument   FRACTURE, TOE     Genitourinary   UTI (urinary tract infection)     Other   Hyperlipidemia   Last Assessment & Plan 11/06/2014 Office Visit Written 11/06/2014  5:16 PM by Wellington Hampshire, MD    Continue treatment with atorvastatin with a target LDL of less than 70.       PALPITATIONS   OVERWEIGHT/OBESITY   Last Assessment & Plan 05/05/2011 Office Visit Written 05/05/2011 11:37 AM by Renella Cunas, MD    Markedly improved. Continue to keep weight off.      RLQ abdominal pain   Last Assessment & Plan 11/24/2010 Office Visit Edited 12/04/2010  2:20 PM by Orvil Feil, NP  69 year old female with several month hx of RLQ pain, achy, intermittent in past, now constant for past several weeks. Not relieved by anything. Exacerbated by movement. No change in bowel habits. Has BM 3-4X per day. No melena or brbpr. Afebrile, no chills. No lack of appetite; does have purposeful wt loss secondary to diagnosis of diabetes. Has had multiple abdominal surgeries in past: see PMH/PSH. 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  Unknown path report at this time; requesting records.  Likely needs updated colonoscopy, but we will proceed with CT abd/pelvis to assess for other etiology prior to invasive procedure. Differentials include adhesive disease, less likely diverticular origin, functional abdominal pain.   Proceed with CT abd/pelvis Creatinine prior; hold metformin for 48 hours after scan Obtain path from polyp in 2005 Likely needs updated colonoscopy; will review CT first The R/B/A have been discussed in detail with pt regarding a colonoscopy; she states understanding and is willing to proceed if necessary.   ADDENDUM: ifobt +, CBC nl. Pt scheduled for 6/15 colonoscopy      Chest pain on exertion   Last Assessment & Plan 11/02/2011 Office Visit Written 11/02/2011 12:36 PM by Renella Cunas, MD    This is been going on for over a month. He does not consistently happen with each exertional event such as walking on a treadmill. She's also had some vague symptoms requiring nitroglycerin at rest.  I found her cath report from 2008. At that time she has some nonobstructive disease the circumflex except for a tiny OM 2 branch that had a 90% narrowing in it. It could not be intervened upon. We treated her medically and she has done well. She had normal left ventricular systolic function.  Based on the above story we need to rule out progressive disease. She has a significant area of ischemia and non-low risk study, she may need catheterization. However, if low risk we will continue with medical therapy.        B12 deficiency anemia   Hypokalemia   Superficial fungus infection of skin   Elevated troponin       Imaging: No results found.

## 2015-04-01 ENCOUNTER — Encounter (HOSPITAL_COMMUNITY)
Admission: RE | Admit: 2015-04-01 | Discharge: 2015-04-01 | Disposition: A | Discharge status: Home / Self Care | Payer: PPO | Source: Ambulatory Visit | Attending: Cardiovascular Disease | Admitting: Cardiovascular Disease

## 2015-04-01 DIAGNOSIS — R079 Chest pain, unspecified: Secondary | ICD-10-CM | POA: Diagnosis not present

## 2015-04-01 DIAGNOSIS — I214 Non-ST elevation (NSTEMI) myocardial infarction: Secondary | ICD-10-CM | POA: Diagnosis not present

## 2015-04-02 ENCOUNTER — Encounter: Payer: Self-pay | Admitting: Physician Assistant

## 2015-04-02 ENCOUNTER — Encounter (HOSPITAL_COMMUNITY): Payer: Self-pay | Admitting: General Practice

## 2015-04-02 ENCOUNTER — Telehealth: Payer: Self-pay

## 2015-04-02 ENCOUNTER — Inpatient Hospital Stay (HOSPITAL_COMMUNITY)
Admission: AD | Admit: 2015-04-02 | Discharge: 2015-04-04 | DRG: 247 | Disposition: A | Discharge status: Home / Self Care | Payer: PPO | Source: Ambulatory Visit | Attending: Interventional Cardiology | Admitting: Interventional Cardiology

## 2015-04-02 ENCOUNTER — Other Ambulatory Visit (HOSPITAL_COMMUNITY)
Admission: RE | Admit: 2015-04-02 | Discharge: 2015-04-02 | Disposition: A | Discharge status: Home / Self Care | Payer: PPO | Source: Ambulatory Visit | Attending: Physician Assistant | Admitting: Physician Assistant

## 2015-04-02 ENCOUNTER — Ambulatory Visit (INDEPENDENT_AMBULATORY_CARE_PROVIDER_SITE_OTHER): Payer: PPO | Admitting: Physician Assistant

## 2015-04-02 VITALS — BP 122/54 | HR 61 | Ht 63.0 in | Wt 151.6 lb

## 2015-04-02 DIAGNOSIS — I25110 Atherosclerotic heart disease of native coronary artery with unstable angina pectoris: Secondary | ICD-10-CM | POA: Diagnosis present

## 2015-04-02 DIAGNOSIS — E1159 Type 2 diabetes mellitus with other circulatory complications: Secondary | ICD-10-CM

## 2015-04-02 DIAGNOSIS — E039 Hypothyroidism, unspecified: Secondary | ICD-10-CM | POA: Diagnosis not present

## 2015-04-02 DIAGNOSIS — Z7982 Long term (current) use of aspirin: Secondary | ICD-10-CM | POA: Diagnosis not present

## 2015-04-02 DIAGNOSIS — E1122 Type 2 diabetes mellitus with diabetic chronic kidney disease: Secondary | ICD-10-CM

## 2015-04-02 DIAGNOSIS — E1165 Type 2 diabetes mellitus with hyperglycemia: Secondary | ICD-10-CM | POA: Diagnosis present

## 2015-04-02 DIAGNOSIS — E785 Hyperlipidemia, unspecified: Secondary | ICD-10-CM | POA: Diagnosis not present

## 2015-04-02 DIAGNOSIS — I214 Non-ST elevation (NSTEMI) myocardial infarction: Principal | ICD-10-CM | POA: Diagnosis present

## 2015-04-02 DIAGNOSIS — N182 Chronic kidney disease, stage 2 (mild): Secondary | ICD-10-CM

## 2015-04-02 DIAGNOSIS — E1121 Type 2 diabetes mellitus with diabetic nephropathy: Secondary | ICD-10-CM | POA: Diagnosis present

## 2015-04-02 DIAGNOSIS — I10 Essential (primary) hypertension: Secondary | ICD-10-CM | POA: Diagnosis present

## 2015-04-02 DIAGNOSIS — Z794 Long term (current) use of insulin: Secondary | ICD-10-CM | POA: Diagnosis not present

## 2015-04-02 DIAGNOSIS — I2510 Atherosclerotic heart disease of native coronary artery without angina pectoris: Secondary | ICD-10-CM | POA: Diagnosis not present

## 2015-04-02 DIAGNOSIS — K219 Gastro-esophageal reflux disease without esophagitis: Secondary | ICD-10-CM | POA: Diagnosis not present

## 2015-04-02 DIAGNOSIS — Z8673 Personal history of transient ischemic attack (TIA), and cerebral infarction without residual deficits: Secondary | ICD-10-CM

## 2015-04-02 DIAGNOSIS — R079 Chest pain, unspecified: Secondary | ICD-10-CM | POA: Diagnosis present

## 2015-04-02 DIAGNOSIS — R072 Precordial pain: Secondary | ICD-10-CM

## 2015-04-02 DIAGNOSIS — I252 Old myocardial infarction: Secondary | ICD-10-CM | POA: Diagnosis not present

## 2015-04-02 DIAGNOSIS — IMO0002 Reserved for concepts with insufficient information to code with codable children: Secondary | ICD-10-CM

## 2015-04-02 DIAGNOSIS — Z9861 Coronary angioplasty status: Secondary | ICD-10-CM

## 2015-04-02 DIAGNOSIS — Z955 Presence of coronary angioplasty implant and graft: Secondary | ICD-10-CM

## 2015-04-02 DIAGNOSIS — Z7902 Long term (current) use of antithrombotics/antiplatelets: Secondary | ICD-10-CM | POA: Diagnosis not present

## 2015-04-02 DIAGNOSIS — M1990 Unspecified osteoarthritis, unspecified site: Secondary | ICD-10-CM | POA: Diagnosis not present

## 2015-04-02 HISTORY — DX: Non-ST elevation (NSTEMI) myocardial infarction: I21.4

## 2015-04-02 HISTORY — DX: Other local lupus erythematosus: L93.2

## 2015-04-02 HISTORY — DX: Pure hypercholesterolemia, unspecified: E78.00

## 2015-04-02 HISTORY — DX: Type 2 diabetes mellitus without complications: E11.9

## 2015-04-02 HISTORY — DX: Personal history of other medical treatment: Z92.89

## 2015-04-02 LAB — CBC WITH DIFFERENTIAL/PLATELET
Basophils Absolute: 0 10*3/uL (ref 0.0–0.1)
Basophils Relative: 0 %
EOS ABS: 0.2 10*3/uL (ref 0.0–0.7)
EOS PCT: 2 %
HCT: 36.5 % (ref 36.0–46.0)
Hemoglobin: 12 g/dL (ref 12.0–15.0)
LYMPHS ABS: 1.4 10*3/uL (ref 0.7–4.0)
Lymphocytes Relative: 19 %
MCH: 29.8 pg (ref 26.0–34.0)
MCHC: 32.9 g/dL (ref 30.0–36.0)
MCV: 90.6 fL (ref 78.0–100.0)
MONOS PCT: 6 %
Monocytes Absolute: 0.5 10*3/uL (ref 0.1–1.0)
Neutro Abs: 5.6 10*3/uL (ref 1.7–7.7)
Neutrophils Relative %: 73 %
PLATELETS: 222 10*3/uL (ref 150–400)
RBC: 4.03 MIL/uL (ref 3.87–5.11)
RDW: 13.2 % (ref 11.5–15.5)
WBC: 7.7 10*3/uL (ref 4.0–10.5)

## 2015-04-02 LAB — COMPREHENSIVE METABOLIC PANEL
ALBUMIN: 3.4 g/dL — AB (ref 3.5–5.0)
ALK PHOS: 58 U/L (ref 38–126)
ALT: 15 U/L (ref 14–54)
ANION GAP: 7 (ref 5–15)
AST: 17 U/L (ref 15–41)
BILIRUBIN TOTAL: 0.4 mg/dL (ref 0.3–1.2)
BUN: 16 mg/dL (ref 6–20)
CALCIUM: 9.1 mg/dL (ref 8.9–10.3)
CO2: 23 mmol/L (ref 22–32)
Chloride: 109 mmol/L (ref 101–111)
Creatinine, Ser: 1.2 mg/dL — ABNORMAL HIGH (ref 0.44–1.00)
GFR calc Af Amer: 52 mL/min — ABNORMAL LOW (ref >60)
GFR, EST NON AFRICAN AMERICAN: 45 mL/min — AB (ref >60)
GLUCOSE: 178 mg/dL — AB (ref 65–99)
Potassium: 4.3 mmol/L (ref 3.5–5.1)
Sodium: 139 mmol/L (ref 135–145)
TOTAL PROTEIN: 5.9 g/dL — AB (ref 6.5–8.1)

## 2015-04-02 LAB — TROPONIN I
TROPONIN I: 0.07 ng/mL — AB (ref <0.031)
Troponin I: 0.09 ng/mL — ABNORMAL HIGH (ref <0.031)

## 2015-04-02 LAB — GLUCOSE, CAPILLARY
Glucose-Capillary: 159 mg/dL — ABNORMAL HIGH (ref 65–99)
Glucose-Capillary: 183 mg/dL — ABNORMAL HIGH (ref 65–99)

## 2015-04-02 LAB — PROTIME-INR
INR: 1.08 (ref 0.00–1.49)
PROTHROMBIN TIME: 14.2 s (ref 11.6–15.2)

## 2015-04-02 LAB — APTT: aPTT: 30 seconds (ref 24–37)

## 2015-04-02 LAB — PLATELET INHIBITION P2Y12: PLATELET FUNCTION P2Y12: 218 [PRU] (ref 194–418)

## 2015-04-02 MED ORDER — ASPIRIN 81 MG PO CHEW
81.0000 mg | CHEWABLE_TABLET | ORAL | Status: AC
  Administered 2015-04-03: 81 mg via ORAL
  Filled 2015-04-02: qty 1

## 2015-04-02 MED ORDER — SODIUM CHLORIDE 0.9 % WEIGHT BASED INFUSION
3.0000 mL/kg/h | INTRAVENOUS | Status: DC
  Administered 2015-04-03: 3 mL/kg/h via INTRAVENOUS

## 2015-04-02 MED ORDER — SODIUM CHLORIDE 0.9 % IV SOLN: 250.0000 mL | INTRAVENOUS | Status: DC | PRN

## 2015-04-02 MED ORDER — ASPIRIN EC 81 MG PO TBEC
81.0000 mg | DELAYED_RELEASE_TABLET | Freq: Every day | ORAL | Status: DC
  Administered 2015-04-04: 81 mg via ORAL
  Filled 2015-04-02: qty 1

## 2015-04-02 MED ORDER — NITROGLYCERIN 0.4 MG SL SUBL: 0.4000 mg | SUBLINGUAL_TABLET | SUBLINGUAL | Status: DC | PRN

## 2015-04-02 MED ORDER — ZOLPIDEM TARTRATE 5 MG PO TABS: 5.0000 mg | ORAL_TABLET | Freq: Every evening | ORAL | Status: DC | PRN

## 2015-04-02 MED ORDER — LOSARTAN POTASSIUM 50 MG PO TABS: 100.0000 mg | ORAL_TABLET | Freq: Every day | ORAL | Status: DC

## 2015-04-02 MED ORDER — HEPARIN (PORCINE) IN NACL 100-0.45 UNIT/ML-% IJ SOLN
950.0000 [IU]/h | INTRAMUSCULAR | Status: DC
  Administered 2015-04-02: 800 [IU]/h via INTRAVENOUS
  Filled 2015-04-02: qty 250

## 2015-04-02 MED ORDER — ACETAMINOPHEN 325 MG PO TABS
650.0000 mg | ORAL_TABLET | ORAL | Status: DC | PRN
  Administered 2015-04-04: 08:00:00 650 mg via ORAL
  Filled 2015-04-02: qty 2

## 2015-04-02 MED ORDER — CLOPIDOGREL BISULFATE 75 MG PO TABS
75.0000 mg | ORAL_TABLET | Freq: Every day | ORAL | Status: DC
  Administered 2015-04-04: 08:00:00 75 mg via ORAL
  Filled 2015-04-02: qty 1

## 2015-04-02 MED ORDER — SODIUM CHLORIDE 0.9 % IJ SOLN
3.0000 mL | Freq: Two times a day (BID) | INTRAMUSCULAR | Status: DC
  Administered 2015-04-02 – 2015-04-03 (×2): 3 mL via INTRAVENOUS

## 2015-04-02 MED ORDER — ONDANSETRON HCL 4 MG/2ML IJ SOLN: 4.0000 mg | Freq: Four times a day (QID) | INTRAMUSCULAR | Status: DC | PRN

## 2015-04-02 MED ORDER — INSULIN NPH (HUMAN) (ISOPHANE) 100 UNIT/ML ~~LOC~~ SUSP
20.0000 [IU] | Freq: Two times a day (BID) | SUBCUTANEOUS | Status: DC
  Administered 2015-04-02 – 2015-04-04 (×2): 20 [IU] via SUBCUTANEOUS
  Filled 2015-04-02 (×3): qty 10

## 2015-04-02 MED ORDER — VITAMIN D 1000 UNITS PO TABS
1000.0000 [IU] | ORAL_TABLET | Freq: Every day | ORAL | Status: DC
  Administered 2015-04-02 – 2015-04-04 (×2): 1000 [IU] via ORAL
  Filled 2015-04-02 (×2): qty 1

## 2015-04-02 MED ORDER — ROPINIROLE HCL 0.5 MG PO TABS
0.5000 mg | ORAL_TABLET | Freq: Every day | ORAL | Status: DC
  Administered 2015-04-02 – 2015-04-03 (×2): 0.5 mg via ORAL
  Filled 2015-04-02 (×3): qty 1

## 2015-04-02 MED ORDER — INSULIN ASPART 100 UNIT/ML ~~LOC~~ SOLN
0.0000 [IU] | Freq: Three times a day (TID) | SUBCUTANEOUS | Status: DC
  Administered 2015-04-02: 3 [IU] via SUBCUTANEOUS
  Administered 2015-04-03 – 2015-04-04 (×2): 8 [IU] via SUBCUTANEOUS

## 2015-04-02 MED ORDER — HEPARIN BOLUS VIA INFUSION
4000.0000 [IU] | Freq: Once | INTRAVENOUS | Status: AC
  Administered 2015-04-02: 4000 [IU] via INTRAVENOUS
  Filled 2015-04-02: qty 4000

## 2015-04-02 MED ORDER — INSULIN ASPART 100 UNIT/ML ~~LOC~~ SOLN
0.0000 [IU] | Freq: Every day | SUBCUTANEOUS | Status: DC
  Administered 2015-04-03: 3 [IU] via SUBCUTANEOUS

## 2015-04-02 MED ORDER — ALPRAZOLAM 0.25 MG PO TABS: 0.2500 mg | ORAL_TABLET | Freq: Two times a day (BID) | ORAL | Status: DC | PRN

## 2015-04-02 MED ORDER — LEVOTHYROXINE SODIUM 137 MCG PO TABS
137.0000 ug | ORAL_TABLET | Freq: Every day | ORAL | Status: DC
  Filled 2015-04-02 (×2): qty 1

## 2015-04-02 MED ORDER — AMLODIPINE BESYLATE 10 MG PO TABS
10.0000 mg | ORAL_TABLET | Freq: Every day | ORAL | Status: DC
  Administered 2015-04-04: 10 mg via ORAL
  Filled 2015-04-02: qty 1

## 2015-04-02 MED ORDER — LABETALOL HCL 300 MG PO TABS
300.0000 mg | ORAL_TABLET | Freq: Two times a day (BID) | ORAL | Status: DC
  Administered 2015-04-02 – 2015-04-04 (×3): 300 mg via ORAL
  Filled 2015-04-02 (×5): qty 1

## 2015-04-02 MED ORDER — SODIUM CHLORIDE 0.9 % IJ SOLN: 3.0000 mL | INTRAMUSCULAR | Status: DC | PRN

## 2015-04-02 MED ORDER — ASPIRIN 81 MG PO CHEW
324.0000 mg | CHEWABLE_TABLET | ORAL | Status: AC
  Administered 2015-04-02: 324 mg via ORAL
  Filled 2015-04-02: qty 4

## 2015-04-02 MED ORDER — PANTOPRAZOLE SODIUM 40 MG PO TBEC
40.0000 mg | DELAYED_RELEASE_TABLET | Freq: Every day | ORAL | Status: DC
  Administered 2015-04-04: 40 mg via ORAL
  Filled 2015-04-02: qty 1

## 2015-04-02 MED ORDER — SODIUM CHLORIDE 0.9 % IJ SOLN: 3.0000 mL | Freq: Two times a day (BID) | INTRAMUSCULAR | Status: DC

## 2015-04-02 MED ORDER — ATORVASTATIN CALCIUM 10 MG PO TABS
10.0000 mg | ORAL_TABLET | Freq: Every day | ORAL | Status: DC
  Administered 2015-04-02 – 2015-04-03 (×2): 10 mg via ORAL
  Filled 2015-04-02 (×2): qty 1

## 2015-04-02 MED ORDER — ASPIRIN 300 MG RE SUPP: 300.0000 mg | RECTAL | Status: AC

## 2015-04-02 MED ORDER — SODIUM CHLORIDE 0.9 % WEIGHT BASED INFUSION: 1.0000 mL/kg/h | INTRAVENOUS | Status: DC

## 2015-04-02 MED ORDER — OMEGA-3-ACID ETHYL ESTERS 1 G PO CAPS
1.0000 g | ORAL_CAPSULE | Freq: Every day | ORAL | Status: DC
  Administered 2015-04-04: 1 g via ORAL
  Filled 2015-04-02: qty 1

## 2015-04-02 NOTE — Telephone Encounter (Signed)
Pt called to say she an another episode of cp last nite,per note from [facility] note,pt to be seen today

## 2015-04-02 NOTE — Progress Notes (Signed)
Cardiology Office Note   Date:  04/02/2015   ID:  Virginia Huber, DOB 1945/10/17, MRN [id number]  PCP:  Purvis Kilts, MD  Cardiologist:  Dr Consuela Mimes, PA-C   Chief Complaint  Patient presents with  . Chest Pain    History of Present Illness: Virginia Huber is a 69 y.o. female with a history of CAD/MI, s/p PCI of the OM 11/2014. Seen by Craig Staggers 10/07 for chest pain at cardiac rehab, started on Ranexa and Protonix.  Virginia Huber presents for evaluation of recurrent chest pain.   She was walking into a football game last p.m., when she had onset of substernal chest pain, her usual angina. She took a sublingual nitroglycerin and it resolved. After the football game, she was walking a and had another, more severe, episode of chest pain. He reached a 9/10. She felt short of breath with it and was a little diaphoretic. She had no nausea or vomiting. She took sublingual nitroglycerin x2 and the pain eventually resolved. It lasted probably close to a half-hour but she is not sure.   She called the office today and came in to be seen. She is currently pain-free. The only episodes of chest pain she has had recently were at cardiac rehabilitation towards the end of her exercise session, and walking to and from a football game. During ambulation last p.m., she admits that she was in a hurry and walking faster and for longer distance than usual. She has had no chest pain today.   Past Medical History  Diagnosis Date  . HTN (hypertension)   . DM (diabetes mellitus) ([place])   . Heart block   . Hypothyroidism   . Ovarian tumor   . Lupus ([place])   . S/P colonoscopy 2005    Dr. Laural Golden: pancolonic divericula, polyp, path unknown currently  . Coronary artery disease 11/30/2014    PCI to the OM1  . UTI (urinary tract infection) 05/08/2013  . Superficial fungus infection of skin 06/29/2013  . Myocardial infarction ([place]) 02/2012  . TIA (transient ischemic attack)   . GERD  (gastroesophageal reflux disease)   . Arthritis     Past Surgical History  Procedure Laterality Date  . Cholecystectomy    . Colostomy      then reversed  . Partial hysterectomy      left ovaries, then ovaries removed later due tumors   . Oophorectomy      1980, nicked bowel, peritonitis, colostomy, now colostomy reversed   . Eye surgeries    . Cardiac catheterization  2008    Tiny OM-2 with 90% narrowing. Med tx.  . Nuclear med stress test  10/2011    Small area of mild ischemia inferoapically.  . Abdominal aortagram N/A 01/03/2014    Procedure: ABDOMINAL [name];  Surgeon: Wellington Hampshire, MD;  [facility] C[facility] LAB;  Service: Cardiovascular;  Laterality: N/A;  . Lower extremity angiogram N/A 01/03/2014    Procedure: LOWER EXTREMITY ANGIOGRAM;  Surgeon: Wellington Hampshire, MD;  Location: [facility] CATH LAB;  Service: Cardiovascular;  Laterality: N/A;  . Cardiac catheterization N/A 11/30/2014    Procedure: Left Heart Cath and Coronary Angiography;  Surgeon: [place] Sine, MD; LAD 20%, CFX 50%, OM1 95%, right PLB 30%, LV normal   . Cardiac catheterization N/A 11/30/2014    Procedure: Coronary Balloon Angioplasty;  Surgeon: Troy Sine, MD;  Angiosculpt scoring balloon and PTCA to the OM1 reducing stenosis from 95% to  less than 10%  . Colostomy reversal  1981    Current Outpatient Prescriptions  Medication Sig Dispense Refill  . amLODipine (NORVASC) 10 MG tablet TAKE ONE (1) TABLET EACH DAY 90 tablet 3  . aspirin EC 81 MG tablet Take 1 tablet (81 mg total) by mouth daily.    [name] atorvastatin (LIPITOR) 10 MG tablet Take 1 tablet (10 mg total) by mouth daily. 90 tablet 3  . cephALEXin (KEFLEX) 500 MG capsule Take 1 capsule (500 mg total) by mouth 2 (two) times daily. 14 capsule 0  . cholecalciferol (VITAMIN D) 1000 UNITS tablet Take 1,000 Units by mouth daily.    . clopidogrel (PLAVIX) 75 MG tablet Take 1 tablet (75 mg total) by mouth daily. 30 tablet 6  . furosemide (LASIX) 20 MG tablet  Take 1 tablet (20 mg total) by mouth as needed for edema (leg swelling). 30 tablet 3  . insulin NPH Human (HUMULIN N,NOVOLIN N) 100 UNIT/ML injection Inject 30-35 Units into the skin 2 (two) times daily before a meal. *per sliding scale*    . labetalol (NORMODYNE) 300 MG tablet TAKE ONE TABLET TWICE DAILY 180 tablet 3  . levothyroxine (SYNTHROID, LEVOTHROID) 137 MCG tablet Take 137 mcg by mouth daily before breakfast.    . losartan (COZAAR) 100 MG tablet TAKE ONE (1) TABLET BY MOUTH EVERY DAY 90 tablet 3  . metFORMIN (GLUCOPHAGE) 500 MG tablet Take 500-1,000 mg by mouth 2 (two) times daily with a meal. Takes 1000mg  in the morning and 500mg  at night    . nitroGLYCERIN (NITROSTAT) 0.4 MG SL tablet Place 0.4 mg under the tongue every 5 (five) minutes as needed for chest pain.    . Omega-3 Fatty Acids (FISH OIL) 1200 MG CAPS Take 1 capsule (1,200 mg total) by mouth daily.    . pantoprazole (PROTONIX) 40 MG tablet Take 1 tablet (40 mg total) by mouth daily. 30 tablet 11  . potassium chloride (K-DUR) 10 MEQ tablet Take 1 tablet (10 mEq total) by mouth as needed (on days that you take your Lasix). 30 tablet 3  . rOPINIRole (REQUIP) 0.5 MG tablet Take 0.5 mg by mouth at bedtime.   0   No current facility-administered medications for this visit.    Allergies:   Penicillins and Percocet    Social History:  The patient  reports that she has never smoked. She has never used smokeless tobacco. She reports that she does not drink alcohol or use illicit drugs.   Family History:  The patient's family history includes Diabetes in her brother; Heart disease in her brother, brother, father, mother, and sister; Thyroid disease in her brother and brother. There is no history of Colon cancer.    ROS:  Please see the history of present illness. All other systems are reviewed and negative.    PHYSICAL EXAM: VS:  BP 122/54 mmHg  Pulse 61  Ht 5\' 3"  (1.6 m)  Wt 151 lb 9.6 oz (68.765 kg)  BMI 26.86 kg/m2 , BMI  Body mass index is 26.86 kg/(m^2). GEN: Well nourished, well developed, female in no acute distress HEENT: normal for age  Neck: no JVD, no carotid bruit, no masses Cardiac: RRR; no murmur, no rubs, or gallops Respiratory:  clear to auscultation bilaterally, normal work of breathing GI: soft, nontender, nondistended, + BS MS: no deformity or atrophy; no edema; distal pulses are 2+ in all 4 extremities  Skin: warm and dry, no rash Neuro:  Strength and sensation are intact Psych:  euthymic mood, full affect   EKG:  EKG is ordered today. The ekg ordered today demonstrates lateral T-wave inversions that are more intense than on previous ECGs   Recent Labs: Lab Results  Component Value Date   TROPONINI 0.09* 04/02/2015    02/15/2015: ALT 16; BUN 13; Creatinine, Ser 0.90; Hemoglobin 11.9*; Platelets 212; Potassium 4.4; Sodium 140    Lipid Panel    Component Value Date/Time   CHOL 127 02/29/2012 0510   TRIG 392* 02/29/2012 0510   HDL 25* 02/29/2012 0510   CHOLHDL 5.1 02/29/2012 0510   VLDL 78* 02/29/2012 0510   LDLCALC 24 02/29/2012 0510     Wt Readings from Last 3 Encounters:  04/02/15 151 lb 9.6 oz (68.765 kg)  03/29/15 151 lb (68.493 kg)  02/15/15 151 lb (68.493 kg)     Other studies Reviewed: Additional studies/ records that were reviewed today include: previous office notes, cath reports and labs.  ASSESSMENT AND PLAN:  1.  Anginal chest pain: ECGs and history were reviewed with Dr. Domenic Polite. The initial plan was for a troponin check and we also ordered a P2Y12 since she is on Plavix. If the troponin was negative, she was to get a Myoview in a.m.  However, the troponin came back elevated. Therefore, the plan will be to do a direct admission to the hospital, add heparin and when necessary nitrates. Continue to cycle enzymes and plan cardiac catheterization in a.m.  Current medicines are reviewed at length with the patient today.  The patient noconcerns regarding  medicines.  The following changes have been made:  no change for now  Labs/ tests ordered today include:   Orders Placed This Encounter  Procedures  . Platelet inhibition p2y12  . Troponin I  . EKG 12-Lead   Disposition:   FU with Dr. Bronson Ing after cath  Trish and Dr Tamala Julian have been contacted and are aware of the admission.  Augusto Garbe  04/02/2015 4:15 PM    [facility] [address], [place], [place]  [address] Phone: [phone]; Fax: [phone]

## 2015-04-02 NOTE — Progress Notes (Signed)
ANTICOAGULATION CONSULT NOTE - Initial Consult  Pharmacy Consult for Heparin Indication: chest pain/ACS  Allergies  Allergen Reactions  . Penicillins Hives  . Percocet [Oxycodone-Acetaminophen] Nausea And Vomiting    Patient Measurements:   Heparin Dosing Weight: 67 kg  Vital Signs: BP: 122/54 mmHg (10/11 1322) Pulse Rate: 61 (10/11 1322)  Labs:  Recent Labs  04/02/15 1427  TROPONINI 0.09*    CrCl cannot be calculated (Patient has no serum creatinine result on file.).   Medical History: Past Medical History  Diagnosis Date  . HTN (hypertension)   . DM (diabetes mellitus) ([place])   . Heart block   . Hypothyroidism   . Ovarian tumor   . Lupus ([place])   . S/P colonoscopy 2005    Dr. Laural Golden: pancolonic divericula, polyp, path unknown currently  . Coronary artery disease 11/30/2014    PCI to the OM1  . UTI (urinary tract infection) 05/08/2013  . Superficial fungus infection of skin 06/29/2013  . Myocardial infarction ([place]) 02/2012  . TIA (transient ischemic attack)   . GERD (gastroesophageal reflux disease)   . Arthritis     Medications:  Prescriptions prior to admission  Medication Sig Dispense Refill Last Dose  . amLODipine (NORVASC) 10 MG tablet TAKE ONE (1) TABLET EACH DAY 90 tablet 3 Taking  . aspirin EC 81 MG tablet Take 1 tablet (81 mg total) by mouth daily.   Taking  . atorvastatin (LIPITOR) 10 MG tablet Take 1 tablet (10 mg total) by mouth daily. 90 tablet 3 Taking  . cephALEXin (KEFLEX) 500 MG capsule Take 1 capsule (500 mg total) by mouth 2 (two) times daily. 14 capsule 0 Taking  . cholecalciferol (VITAMIN D) 1000 UNITS tablet Take 1,000 Units by mouth daily.   Taking  . clopidogrel (PLAVIX) 75 MG tablet Take 1 tablet (75 mg total) by mouth daily. 30 tablet 6 Taking  . furosemide (LASIX) 20 MG tablet Take 1 tablet (20 mg total) by mouth as needed for edema (leg swelling). 30 tablet 3 Taking  . insulin NPH Human (HUMULIN N,NOVOLIN N) 100 UNIT/ML injection  Inject 30-35 Units into the skin 2 (two) times daily before a meal. *per sliding scale*   Taking  . labetalol (NORMODYNE) 300 MG tablet TAKE ONE TABLET TWICE DAILY 180 tablet 3 Taking  . levothyroxine (SYNTHROID, LEVOTHROID) 137 MCG tablet Take 137 mcg by mouth daily before breakfast.   Taking  . losartan (COZAAR) 100 MG tablet TAKE ONE (1) TABLET BY MOUTH EVERY DAY 90 tablet 3 Taking  . metFORMIN (GLUCOPHAGE) 500 MG tablet Take 500-1,000 mg by mouth 2 (two) times daily with a meal. Takes 1000mg  in the morning and 500mg  at night   Taking  . nitroGLYCERIN (NITROSTAT) 0.4 MG SL tablet Place 0.4 mg under the tongue every 5 (five) minutes as needed for chest pain.   Taking  . Omega-3 Fatty Acids (FISH OIL) 1200 MG CAPS Take 1 capsule (1,200 mg total) by mouth daily.   Taking  . pantoprazole (PROTONIX) 40 MG tablet Take 1 tablet (40 mg total) by mouth daily. 30 tablet 11 Taking  . potassium chloride (K-DUR) 10 MEQ tablet Take 1 tablet (10 mEq total) by mouth as needed (on days that you take your Lasix). 30 tablet 3 Taking  . rOPINIRole (REQUIP) 0.5 MG tablet Take 0.5 mg by mouth at bedtime.   0 Taking   Scheduled:  . aspirin  324 mg Oral NOW   Or  . aspirin  300 mg Rectal NOW  . [  START ON 04/03/2015] aspirin  81 mg Oral Pre-Cath  . [START ON 04/03/2015] insulin aspart  0-15 Units Subcutaneous TID WC  . insulin aspart  0-5 Units Subcutaneous QHS  . sodium chloride  3 mL Intravenous Q12H  . sodium chloride  3 mL Intravenous Q12H   Infusions:  . [START ON 04/03/2015] sodium chloride     Followed by  . [START ON 04/03/2015] sodium chloride      Assessment: 69yo female with history of CAD/MI s/p PCI to OM1, HTN, DM2, TIA, GERD and hypothyroid presents with substernal CP on exertion. Pharmacy is consulted to dose heparin for ACS/chest pain. Trop 0.09, other labs pending.  Pt is scheduled for cath lab at 1430.  Goal of Therapy:  Heparin level 0.3-0.7 units/ml Monitor platelets by  anticoagulation protocol: Yes   Plan:  Give 4000 units bolus x 1 Start heparin infusion at 800 units/hr Check anti-Xa level in 6 hours and daily while on heparin Continue to monitor H&H and platelets  F/u after cath tomorrow PM  Andrey Cota. Diona Foley, PharmD Clinical Pharmacist Pager [phone] 04/02/2015,5:26 PM

## 2015-04-02 NOTE — H&P (Signed)
History and Physical   Patient ID: Virginia Huber MRN: [id number], DOB/AGE: 1945/11/13 69 y.o. Date of Encounter: 04/02/2015  Primary Physician: Purvis Kilts, MD Primary Cardiologist: Dr. Bronson Ing  Chief Complaint:  Chest pain  HPI: Virginia Huber is a 69 y.o. female with a history of CAD/MI, s/p PCI of the OM 11/2014. Seen by Craig Staggers 10/07 for chest pain that had started at cardiac rehab, started on Ranexa and Protonix. She was having some exertional chest pain but was also having postprandial chest pain.  She was compliant with the medications and has been taking them as directed.  She was walking into a football game last p.m., when she had onset of substernal chest pain, her usual angina. She took a sublingual nitroglycerin and it resolved. After the football game, she was walking a and had another, more severe, episode of chest pain. It reached a 9/10. She felt short of breath with it and was a little diaphoretic. She had no nausea or vomiting. She took sublingual nitroglycerin x2 and the pain eventually resolved. It lasted probably close to a half-hour but she is not sure.   She called the office today and came in to be seen. She pain-free in the office. The only episodes of chest pain she has had recently were at cardiac rehabilitation towards the end of her exercise session, and walking to and from a football game. During ambulation last p.m., she admits that she was in a hurry and walking faster and for a longer distance than usual. She has had no chest pain today.  Past Medical History  Diagnosis Date  . HTN (hypertension)   . DM (diabetes mellitus) ([place])   . Heart block   . Hypothyroidism   . Ovarian tumor   . Lupus ([place])   . S/P colonoscopy 2005    Dr. Laural Golden: pancolonic divericula, polyp, path unknown currently  . Coronary artery disease 11/30/2014    PCI to the OM1  . UTI (urinary tract infection) 05/08/2013  . Superficial fungus infection of skin  06/29/2013  . Myocardial infarction ([place]) 02/2012  . TIA (transient ischemic attack)   . GERD (gastroesophageal reflux disease)   . Arthritis     Surgical History:  Past Surgical History  Procedure Laterality Date  . Cholecystectomy    . Colostomy      then reversed  . Partial hysterectomy      left ovaries, then ovaries removed later due tumors   . Oophorectomy      1980, nicked bowel, peritonitis, colostomy, now colostomy reversed   . Eye surgeries    . Cardiac catheterization  2008    Tiny OM-2 with 90% narrowing. Med tx.  . Nuclear med stress test  10/2011    Small area of mild ischemia inferoapically.  . Abdominal aortagram N/A 01/03/2014    Procedure: ABDOMINAL [name];  Surgeon: Wellington Hampshire, MD;  Location: [facility] CATH LAB;  Service: Cardiovascular;  Laterality: N/A;  . Lower extremity angiogram N/A 01/03/2014    Procedure: LOWER EXTREMITY ANGIOGRAM;  Surgeon: Wellington Hampshire, MD;  Location: [facility] CATH LAB;  Service: Cardiovascular;  Laterality: N/A;  . Cardiac catheterization N/A 11/30/2014    Procedure: Left Heart Cath and Coronary Angiography;  Surgeon: Troy Sine, MD; LAD 20%, CFX 50%, OM1 95%, right PLB 30%, LV normal   . Cardiac catheterization N/A 11/30/2014    Procedure: Coronary Balloon Angioplasty;  Surgeon: Troy Sine, MD;  Angiosculpt scoring balloon  and PTCA to the OM1 reducing stenosis from 95% to less than 10%  . Colostomy reversal  1981     I have reviewed the patient's current medications. Medication Sig  amLODipine (NORVASC) 10 MG tablet TAKE ONE (1) TABLET EACH DAY  aspirin EC 81 MG tablet Take 1 tablet (81 mg total) by mouth daily.  atorvastatin (LIPITOR) 10 MG tablet Take 1 tablet (10 mg total) by mouth daily.  cephALEXin (KEFLEX) 500 MG capsule Take 1 capsule (500 mg total) by mouth 2 (two) times daily.  cholecalciferol (VITAMIN D) 1000 UNITS tablet Take 1,000 Units by mouth daily.  clopidogrel (PLAVIX) 75 MG tablet Take 1 tablet (75 mg total) by  mouth daily.  furosemide (LASIX) 20 MG tablet Take 1 tablet (20 mg total) by mouth as needed for edema (leg swelling).  insulin NPH Human (HUMULIN N,NOVOLIN N) 100 UNIT/ML injection Inject 30-35 Units into the skin 2 (two) times daily before a meal. *per sliding scale*  labetalol (NORMODYNE) 300 MG tablet TAKE ONE TABLET TWICE DAILY  levothyroxine (SYNTHROID, LEVOTHROID) 137 MCG tablet Take 137 mcg by mouth daily before breakfast.  losartan (COZAAR) 100 MG tablet TAKE ONE (1) TABLET BY MOUTH EVERY DAY  metFORMIN (GLUCOPHAGE) 500 MG tablet Take 500-1,000 mg by mouth 2 (two) times daily with a meal. Takes 1000mg  in the morning and 500mg  at night  nitroGLYCERIN (NITROSTAT) 0.4 MG SL tablet Place 0.4 mg under the tongue every 5 (five) minutes as needed for chest pain.  Omega-3 Fatty Acids (FISH OIL) 1200 MG CAPS Take 1 capsule (1,200 mg total) by mouth daily.  pantoprazole (PROTONIX) 40 MG tablet Take 1 tablet (40 mg total) by mouth daily.  potassium chloride (K-DUR) 10 MEQ tablet Take 1 tablet (10 mEq total) by mouth as needed (on days that you take your Lasix).  rOPINIRole (REQUIP) 0.5 MG tablet Take 0.5 mg by mouth at bedtime.    Allergies:  Allergies  Allergen Reactions  . Penicillins Hives  . Percocet [Oxycodone-Acetaminophen] Nausea And Vomiting    Social History   Social History  . Marital Status: Married    Spouse Name: N/A  . Number of Children: N/A  . Years of Education: N/A   Occupational History  . Retired     Research officer, political party   Social History Main Topics  . Smoking status: Never Smoker   . Smokeless tobacco: Never Used  . Alcohol Use: No  . Drug Use: No  . Sexual Activity: Not Currently    Birth Control/ Protection: Surgical   Other Topics Concern  . Not on file   Social History Narrative    Family History  Problem Relation Age of Onset  . Heart disease Mother     deceased  . Heart disease Father     deceased, heart disease  . Colon cancer Neg Hx   .  Diabetes Brother   . Heart disease Brother   . Thyroid disease Brother   . Heart disease Sister   . Heart disease Brother   . Thyroid disease Brother    Family Status  Relation Status Death Age  . Mother Deceased   . Father Deceased   . Paternal Grandfather Deceased   . Paternal Grandmother Deceased   . Maternal Grandmother Deceased   . Maternal Grandfather Deceased   . Brother Alive   . Sister Alive   . Brother Alive   . Son Alive   . Daughter Alive     Review of Systems:  Full 14-point review of systems otherwise negative except as noted above.  Physical Exam: VS performed in the office: BP 122/54 mmHg  Pulse 61  Ht 5\' 3"  (1.6 m)  Wt 151 lb 9.6 oz (68.765 kg)  BMI 26.86 kg/m2 , BMI Body mass index is 26.86 kg/(m^2). General: Well developed, well nourished,female in no acute distress. Head: Normocephalic, atraumatic, sclera non-icteric, no xanthomas, nares are without discharge. Dentition: good Neck: No carotid bruits. JVD not elevated. No thyromegally Lungs: Good expansion bilaterally. without wheezes or rhonchi.  Heart: Regular rate and rhythm with S1 S2.  No S3 or S4.  No murmur, no rubs, or gallops appreciated. Abdomen: Soft, non-tender, non-distended with normoactive bowel sounds. No hepatomegaly. No rebound/guarding. No obvious abdominal masses. Msk:  Strength and tone appear normal for age. No joint deformities or effusions, no spine or costo-vertebral angle tenderness. Extremities: No clubbing or cyanosis. No edema.  Distal pedal pulses are 2+ in 4 extrem Neuro: Alert and oriented X 3. Moves all extremities spontaneously. No focal deficits noted. Psych:  Responds to questions appropriately with a normal affect. Skin: No rashes or lesions noted  Labs: admission labs pending    Recent Labs  04/02/15 1427  TROPONINI 0.09*    ECG: 04/02/2015 Sinus rhythm, rate 62 Anterolateral T wave changes, more severe than on ECG dated 03/29/2015  ASSESSMENT AND PLAN:    Principal Problem:   NSTEMI (non-ST elevated myocardial infarction) ([place]) - admit, cycle cardiac enzymes, add heparin and continue home cardiac medications. - Cardiac catheterization in the a.m. The risks and benefits of a cardiac catheterization including, but not limited to, death, stroke, MI, kidney damage and bleeding were discussed with the patient who indicates understanding and agrees to proceed.   As she is on Plavix, P2Y12 has been sent, results pending.  Otherwise, Continue home medications and use sliding scale insulin Active Problems:   Essential hypertension   DM (diabetes mellitus), type 2, uncontrolled ([place])  Signed, Rosaria Ferries, PA-C 04/02/2015 4:23 PM Beeper [phone]

## 2015-04-02 NOTE — Patient Instructions (Addendum)
Your physician recommends that you schedule a follow-up appointment in: as planned     STAT labs NOW   Troponin, P2Y 12    Go direct to admitting in the [place] tower at [facility] for admission       Thank you for choosing [facility] !

## 2015-04-02 NOTE — Telephone Encounter (Signed)
Pt admitted for elevated troponin and unstable angina to [facility], admitting Dr McDowell,accepting Dr.Smith.Pt in route via car with spouse

## 2015-04-02 NOTE — Progress Notes (Signed)
Virginia Huber seen by Ms. Barrett PA-C for an office visit today arranged by the patient. Recent records indicate that she was just seen on October 7 by Ms. Lawrence NP. Cardiac history includes PTCA to the OM1 in June of this year with otherwise mild to moderate residual CAD, most significant lesion being a 50% mid circumflex stenosis, all of which were managed medically. She participated in cardiac rehabilitation, did have some recurrent chest pain at the end of the session. At the visit on October 7, case was discussed with Dr. Bronson Ing, plan at that time was to add Protonix in case there was a GI component of chest pain symptoms, and she was also started on Ranexa for antianginal benefit. She presented back to the office stating that she has continued to have recurring episodes of chest pain concerning for angina, she took nitroglycerin with improvement. On assessment the office she was chest pain-free, ECG showed sinus rhythm with more prominent ST segment abnormalities in the lateral leads, although her prior tracing on October 7 was similar but less intense. She reports compliance with her medications including new use of Ranexa.  Ms. Ahmed Prima PA-C discussed the case with me today as hospital provider. We discussed obtaining a troponin I level with plan being that if this was normal we would consider arranging a Cardiolite study for tomorrow morning to reassess ischemic burden, versus if abnormal proceeding directly to a cardiac catheterization.  Troponin I level returned at 0.09, and therefore arrangements were made for her to be directly admitted to [facility] with plan for a cardiac catheterization tomorrow. I was not able to see and examine the patient prior to her leaving the office however, and she will be assessed by the team at [facility].  Satira Sark, M.D., F.A.C.C.

## 2015-04-03 ENCOUNTER — Encounter (HOSPITAL_COMMUNITY): Payer: PPO

## 2015-04-03 ENCOUNTER — Other Ambulatory Visit: Payer: Self-pay

## 2015-04-03 ENCOUNTER — Encounter (HOSPITAL_COMMUNITY)
Admission: AD | Disposition: A | Discharge status: Home / Self Care | Payer: Self-pay | Source: Ambulatory Visit | Attending: Interventional Cardiology

## 2015-04-03 ENCOUNTER — Ambulatory Visit (HOSPITAL_COMMUNITY): Admission: RE | Admit: 2015-04-03 | Payer: PPO | Source: Ambulatory Visit | Admitting: Cardiovascular Disease

## 2015-04-03 ENCOUNTER — Encounter (HOSPITAL_COMMUNITY): Payer: Self-pay | Admitting: Internal Medicine

## 2015-04-03 DIAGNOSIS — I2510 Atherosclerotic heart disease of native coronary artery without angina pectoris: Secondary | ICD-10-CM

## 2015-04-03 HISTORY — PX: CARDIAC CATHETERIZATION: SHX172

## 2015-04-03 LAB — CBC
HCT: 34.7 % — ABNORMAL LOW (ref 36.0–46.0)
Hemoglobin: 11.5 g/dL — ABNORMAL LOW (ref 12.0–15.0)
MCH: 30.3 pg (ref 26.0–34.0)
MCHC: 33.1 g/dL (ref 30.0–36.0)
MCV: 91.6 fL (ref 78.0–100.0)
PLATELETS: 210 10*3/uL (ref 150–400)
RBC: 3.79 MIL/uL — ABNORMAL LOW (ref 3.87–5.11)
RDW: 13.3 % (ref 11.5–15.5)
WBC: 6.9 10*3/uL (ref 4.0–10.5)

## 2015-04-03 LAB — GLUCOSE, CAPILLARY
GLUCOSE-CAPILLARY: 209 mg/dL — AB (ref 65–99)
Glucose-Capillary: 139 mg/dL — ABNORMAL HIGH (ref 65–99)
Glucose-Capillary: 263 mg/dL — ABNORMAL HIGH (ref 65–99)
Glucose-Capillary: 292 mg/dL — ABNORMAL HIGH (ref 65–99)

## 2015-04-03 LAB — POCT ACTIVATED CLOTTING TIME: ACTIVATED CLOTTING TIME: 509 s

## 2015-04-03 LAB — LIPID PANEL
CHOLESTEROL: 133 mg/dL (ref 0–200)
HDL: 23 mg/dL — AB (ref >40)
LDL CALC: UNDETERMINED mg/dL (ref 0–99)
TRIGLYCERIDES: 617 mg/dL — AB (ref <150)
Total CHOL/HDL Ratio: 5.8 RATIO
VLDL: UNDETERMINED mg/dL (ref 0–40)

## 2015-04-03 LAB — BASIC METABOLIC PANEL
Anion gap: 7 (ref 5–15)
BUN: 13 mg/dL (ref 6–20)
CHLORIDE: 104 mmol/L (ref 101–111)
CO2: 25 mmol/L (ref 22–32)
CREATININE: 1.02 mg/dL — AB (ref 0.44–1.00)
Calcium: 8.6 mg/dL — ABNORMAL LOW (ref 8.9–10.3)
GFR calc Af Amer: >60 mL/min (ref >60)
GFR calc non Af Amer: 55 mL/min — ABNORMAL LOW (ref >60)
Glucose, Bld: 170 mg/dL — ABNORMAL HIGH (ref 65–99)
POTASSIUM: 3.8 mmol/L (ref 3.5–5.1)
SODIUM: 136 mmol/L (ref 135–145)

## 2015-04-03 LAB — HEPARIN LEVEL (UNFRACTIONATED): Heparin Unfractionated: <0.1 IU/mL — ABNORMAL LOW (ref 0.30–0.70)

## 2015-04-03 LAB — HEMOGLOBIN A1C
HEMOGLOBIN A1C: 8.6 % — AB (ref 4.8–5.6)
MEAN PLASMA GLUCOSE: 200 mg/dL

## 2015-04-03 LAB — TROPONIN I
Troponin I: 0.05 ng/mL — ABNORMAL HIGH (ref <0.031)
Troponin I: 0.05 ng/mL — ABNORMAL HIGH (ref <0.031)

## 2015-04-03 SURGERY — LEFT HEART CATH AND CORONARY ANGIOGRAPHY
Anesthesia: LOCAL

## 2015-04-03 MED ORDER — NITROGLYCERIN 1 MG/10 ML FOR IR/CATH LAB
INTRA_ARTERIAL | Status: DC | PRN
  Administered 2015-04-03: 11:00:00

## 2015-04-03 MED ORDER — SODIUM CHLORIDE 0.9 % IV SOLN: INTRAVENOUS | Status: AC

## 2015-04-03 MED ORDER — SODIUM CHLORIDE 0.9 % IV SOLN
250.0000 mg | INTRAVENOUS | Status: DC | PRN
  Administered 2015-04-03: 1.75 mg/kg/h via INTRAVENOUS

## 2015-04-03 MED ORDER — NITROGLYCERIN 1 MG/10 ML FOR IR/CATH LAB
INTRA_ARTERIAL | Status: DC | PRN
  Administered 2015-04-03 (×3): 200 ug via INTRA_ARTERIAL

## 2015-04-03 MED ORDER — NITROGLYCERIN 1 MG/10 ML FOR IR/CATH LAB
INTRA_ARTERIAL | Status: AC
Start: 2015-04-03 — End: 2015-04-03
  Filled 2015-04-03: qty 10

## 2015-04-03 MED ORDER — MIDAZOLAM HCL 2 MG/2ML IJ SOLN
INTRAMUSCULAR | Status: DC | PRN
  Administered 2015-04-03 (×3): 1 mg via INTRAVENOUS

## 2015-04-03 MED ORDER — RANOLAZINE ER 500 MG PO TB12
500.0000 mg | ORAL_TABLET | Freq: Two times a day (BID) | ORAL | Status: DC
  Administered 2015-04-03 – 2015-04-04 (×2): 500 mg via ORAL
  Filled 2015-04-03 (×2): qty 1

## 2015-04-03 MED ORDER — HEPARIN SODIUM (PORCINE) 1000 UNIT/ML IJ SOLN
INTRAMUSCULAR | Status: DC | PRN
  Administered 2015-04-03: 3500 [IU] via INTRAVENOUS

## 2015-04-03 MED ORDER — HEPARIN (PORCINE) IN NACL 2-0.9 UNIT/ML-% IJ SOLN
INTRAMUSCULAR | Status: AC
  Filled 2015-04-03: qty 1000

## 2015-04-03 MED ORDER — FENTANYL CITRATE (PF) 100 MCG/2ML IJ SOLN
INTRAMUSCULAR | Status: DC | PRN
  Administered 2015-04-03: 50 ug via INTRAVENOUS

## 2015-04-03 MED ORDER — VERAPAMIL HCL 2.5 MG/ML IV SOLN
INTRAVENOUS | Status: AC
  Filled 2015-04-03: qty 2

## 2015-04-03 MED ORDER — SODIUM CHLORIDE 0.9 % IV SOLN: 250.0000 mL | INTRAVENOUS | Status: DC | PRN

## 2015-04-03 MED ORDER — CLOPIDOGREL BISULFATE 75 MG PO TABS
ORAL_TABLET | ORAL | Status: DC | PRN
  Administered 2015-04-03: 75 mg via ORAL

## 2015-04-03 MED ORDER — SODIUM CHLORIDE 0.9 % IJ SOLN: 3.0000 mL | INTRAMUSCULAR | Status: DC | PRN

## 2015-04-03 MED ORDER — IOHEXOL 350 MG/ML SOLN
INTRAVENOUS | Status: DC | PRN
  Administered 2015-04-03: 115 mL via INTRAVENOUS

## 2015-04-03 MED ORDER — BIVALIRUDIN BOLUS VIA INFUSION - CUPID
INTRAVENOUS | Status: DC | PRN
  Administered 2015-04-03: 51.9 mg via INTRAVENOUS

## 2015-04-03 MED ORDER — LIDOCAINE HCL (PF) 1 % IJ SOLN
INTRAMUSCULAR | Status: AC
  Filled 2015-04-03: qty 30

## 2015-04-03 MED ORDER — FENTANYL CITRATE (PF) 100 MCG/2ML IJ SOLN
INTRAMUSCULAR | Status: AC
Start: 2015-04-03 — End: 2015-04-03
  Filled 2015-04-03: qty 4

## 2015-04-03 MED ORDER — MIDAZOLAM HCL 2 MG/2ML IJ SOLN
INTRAMUSCULAR | Status: DC | PRN
  Administered 2015-04-03: 1 mg via INTRAVENOUS

## 2015-04-03 MED ORDER — CLOPIDOGREL BISULFATE 75 MG PO TABS
ORAL_TABLET | ORAL | Status: AC
  Filled 2015-04-03: qty 1

## 2015-04-03 MED ORDER — HEPARIN SODIUM (PORCINE) 1000 UNIT/ML IJ SOLN
INTRAMUSCULAR | Status: AC
  Filled 2015-04-03: qty 1

## 2015-04-03 MED ORDER — HEPARIN (PORCINE) IN NACL 2-0.9 UNIT/ML-% IJ SOLN
INTRAMUSCULAR | Status: DC | PRN
  Administered 2015-04-03: 10:00:00 via INTRA_ARTERIAL

## 2015-04-03 MED ORDER — NITROGLYCERIN 1 MG/10 ML FOR IR/CATH LAB
INTRA_ARTERIAL | Status: DC | PRN
  Administered 2015-04-03: 100 ug via INTRACORONARY
  Administered 2015-04-03: 200 ug via INTRA_ARTERIAL
  Administered 2015-04-03: 150 ug via INTRACORONARY

## 2015-04-03 MED ORDER — HEPARIN BOLUS VIA INFUSION
1500.0000 [IU] | Freq: Once | INTRAVENOUS | Status: AC
  Administered 2015-04-03: 1500 [IU] via INTRAVENOUS
  Filled 2015-04-03: qty 1500

## 2015-04-03 MED ORDER — MIDAZOLAM HCL 2 MG/2ML IJ SOLN
INTRAMUSCULAR | Status: AC
  Filled 2015-04-03: qty 4

## 2015-04-03 MED ORDER — FENTANYL CITRATE (PF) 100 MCG/2ML IJ SOLN
INTRAMUSCULAR | Status: DC | PRN
  Administered 2015-04-03 (×2): 25 ug via INTRAVENOUS

## 2015-04-03 MED ORDER — LIDOCAINE HCL (PF) 1 % IJ SOLN
INTRAMUSCULAR | Status: DC | PRN
  Administered 2015-04-03: 5 mL

## 2015-04-03 MED ORDER — NITROGLYCERIN 1 MG/10 ML FOR IR/CATH LAB
INTRA_ARTERIAL | Status: AC
  Filled 2015-04-03: qty 10

## 2015-04-03 MED ORDER — SODIUM CHLORIDE 0.9 % IJ SOLN
3.0000 mL | Freq: Two times a day (BID) | INTRAMUSCULAR | Status: DC
  Administered 2015-04-03: 22:00:00 3 mL via INTRAVENOUS

## 2015-04-03 MED ORDER — BIVALIRUDIN 250 MG IV SOLR
INTRAVENOUS | Status: AC
Start: 2015-04-03 — End: 2015-04-03
  Filled 2015-04-03: qty 250

## 2015-04-03 SURGICAL SUPPLY — 19 items
BAG SNAP BAND KOVER 36X36 (MISCELLANEOUS) ×1 IMPLANT
BALLN EMERGE MR 2.5X15 (BALLOONS) ×2
BALLN ~~LOC~~ TREK RX 3.25X15 (BALLOONS) ×1 IMPLANT
BALLOON EMERGE MR 2.5X15 (BALLOONS) IMPLANT
CATH INFINITI 5 FR JL3.5 (CATHETERS) ×2 IMPLANT
CATH INFINITI 5FR ANG PIGTAIL (CATHETERS) ×2 IMPLANT
CATH INFINITI JR4 5F (CATHETERS) ×2 IMPLANT
CATH VISTA GUIDE 6FR XBLAD3.5 (CATHETERS) ×1 IMPLANT
DEVICE RAD COMP TR BAND LRG (VASCULAR PRODUCTS) ×2 IMPLANT
GLIDESHEATH SLEND SS 6F .021 (SHEATH) ×2 IMPLANT
KIT ENCORE 26 ADVANTAGE (KITS) ×1 IMPLANT
KIT HEART LEFT (KITS) ×2 IMPLANT
PACK CARDIAC CATHETERIZATION (CUSTOM PROCEDURE TRAY) ×2 IMPLANT
STENT XIENCE ALPINE RX 3.0X18 (Permanent Stent) ×1 IMPLANT
SYR MEDRAD MARK V 150ML (SYRINGE) ×1 IMPLANT
TRANSDUCER W/STOPCOCK (MISCELLANEOUS) ×2 IMPLANT
TUBING CIL FLEX 10 FLL-RA (TUBING) ×2 IMPLANT
WIRE COUGAR XT STRL 190CM (WIRE) ×1 IMPLANT
WIRE SAFE-T 1.5MM-J .035X260CM (WIRE) ×2 IMPLANT

## 2015-04-03 NOTE — Progress Notes (Signed)
TR BAND REMOVAL  LOCATION:    right radial  DEFLATED PER PROTOCOL:    Yes.    TIME BAND OFF / DRESSING APPLIED:    1430   SITE UPON ARRIVAL:    Level 0  SITE AFTER BAND REMOVAL:    Level 0  CIRCULATION SENSATION AND MOVEMENT:    Within Normal Limits   Yes.    COMMENTS:   Rechecked at 1500 and frequently during remainder of shift with no change in assessment

## 2015-04-03 NOTE — Progress Notes (Signed)
UR Completed Clayton Jarmon Graves-Bigelow, RN,BSN [phone]  

## 2015-04-03 NOTE — Progress Notes (Signed)
ANTICOAGULATION CONSULT NOTE - Follow Up Consult  Pharmacy Consult for Heparin  Indication: chest pain/ACS  Patient Measurements: Height: 5\' 3"  (160 cm) Weight: 153 lb (69.4 kg) IBW/kg (Calculated) : 52.4  Vital Signs: Temp: 98.6 F (37 C) (10/11 2045) Temp Source: Oral (10/11 2045) BP: 141/60 mmHg (10/11 2045) Pulse Rate: 73 (10/11 2045)  Labs:  Recent Labs  04/02/15 1427 04/02/15 1857 04/02/15 2318 04/03/15 0013  HGB  --  12.0  --   --   HCT  --  36.5  --   --   PLT  --  222  --   --   APTT  --  30  --   --   LABPROT  --  14.2  --   --   INR  --  1.08  --   --   HEPARINUNFRC  --   --   --  <0.10*  CREATININE  --  1.20*  --   --   TROPONINI 0.09* 0.07* 0.05*  --     Estimated Creatinine Clearance: 41.4 mL/min (by C-G formula based on Cr of 1.2).  Assessment: Undetectable heparin level, no issues per RN.   Goal of Therapy:  Heparin level 0.3-0.7 units/ml Monitor platelets by anticoagulation protocol: Yes   Plan:  -Heparin 1500 units BOLUS -Increase heparin drip to 950 units/hr -1100 HL, pending cath -Daily CBC/HL -Monitor for bleeding  Narda Bonds 04/03/2015,2:48 AM

## 2015-04-03 NOTE — Interval H&P Note (Signed)
History and Physical Interval Note:  04/03/2015 9:07 AM  [name]  has presented today for surgery, with the diagnosis of NSTEMI  The various methods of treatment have been discussed with the patient and family. After consideration of risks, benefits and other options for treatment, the patient has consented to  Procedure(s): Left Heart Cath and Coronary Angiography (N/A) and possible coronary angioplasty as a surgical intervention .  The patient's history has been reviewed, patient examined, no change in status, stable for surgery.  I have reviewed the patient's chart and labs.  Questions were answered to the patient's satisfaction.     Avarae Zwart, Quillian Quince

## 2015-04-03 NOTE — Care Management Note (Signed)
Case Management Note  Patient Details  Name: MAYNARD HOESE MRN: [id number] Date of Birth: [birth year]  Subjective/Objective:    Pt admitted for cp-Nstemi. Post cath.                 Action/Plan: CM to monitor for disposition needs.    Expected Discharge Date:                  Expected Discharge Plan:  Home/Self Care  In-House Referral:     Discharge planning Services  CM Consult  Post Acute Care Choice:    Choice offered to:     DME Arranged:    DME Agency:     HH Arranged:    HH Agency:     Status of Service:  In process, will continue to follow  Medicare Important Message Given:    Date Medicare IM Given:    Medicare IM give by:    Date Additional Medicare IM Given:    Additional Medicare Important Message give by:     If discussed at [place] of Stay Meetings, dates discussed:    Additional Comments:  Bethena Roys, RN 04/03/2015, 1:18 PM

## 2015-04-04 ENCOUNTER — Other Ambulatory Visit: Payer: Self-pay

## 2015-04-04 ENCOUNTER — Encounter (HOSPITAL_COMMUNITY): Payer: Self-pay | Admitting: Physician Assistant

## 2015-04-04 LAB — BASIC METABOLIC PANEL
ANION GAP: 7 (ref 5–15)
BUN: 11 mg/dL (ref 6–20)
CO2: 26 mmol/L (ref 22–32)
Calcium: 8.7 mg/dL — ABNORMAL LOW (ref 8.9–10.3)
Chloride: 103 mmol/L (ref 101–111)
Creatinine, Ser: 0.93 mg/dL (ref 0.44–1.00)
GFR calc Af Amer: >60 mL/min (ref >60)
GFR calc non Af Amer: >60 mL/min (ref >60)
GLUCOSE: 211 mg/dL — AB (ref 65–99)
Potassium: 3.9 mmol/L (ref 3.5–5.1)
Sodium: 136 mmol/L (ref 135–145)

## 2015-04-04 LAB — CBC
HEMATOCRIT: 35.6 % — AB (ref 36.0–46.0)
HEMOGLOBIN: 11.7 g/dL — AB (ref 12.0–15.0)
MCH: 29.8 pg (ref 26.0–34.0)
MCHC: 32.9 g/dL (ref 30.0–36.0)
MCV: 90.8 fL (ref 78.0–100.0)
Platelets: 206 10*3/uL (ref 150–400)
RBC: 3.92 MIL/uL (ref 3.87–5.11)
RDW: 13.2 % (ref 11.5–15.5)
WBC: 6.6 10*3/uL (ref 4.0–10.5)

## 2015-04-04 LAB — GLUCOSE, CAPILLARY: Glucose-Capillary: 265 mg/dL — ABNORMAL HIGH (ref 65–99)

## 2015-04-04 MED ORDER — PANTOPRAZOLE SODIUM 40 MG PO TBEC: 40.0000 mg | DELAYED_RELEASE_TABLET | Freq: Every day | ORAL | Status: DC

## 2015-04-04 MED ORDER — NITROGLYCERIN 0.4 MG SL SUBL: 0.4000 mg | SUBLINGUAL_TABLET | SUBLINGUAL | Status: DC | PRN

## 2015-04-04 MED ORDER — ATORVASTATIN CALCIUM 10 MG PO TABS: 10.0000 mg | ORAL_TABLET | Freq: Every day | ORAL | Status: DC

## 2015-04-04 NOTE — Progress Notes (Signed)
SUBJECTIVE:  No further chest pain.  Mild wrist soreness   PHYSICAL EXAM Filed Vitals:   04/03/15 1109 04/03/15 1523 04/03/15 2131 04/04/15 0315  BP: 131/46 138/47 146/47 138/47  Pulse: 63 69 74 71  Temp: 98.7 F (37.1 C) 98.7 F (37.1 C) 98.8 F (37.1 C) 98.1 F (36.7 C)  TempSrc: Oral Oral Oral Oral  Resp: 16 20 23 18   Height:      Weight:    154 lb 1.6 oz (69.9 kg)  SpO2: 93% 93% 95% 96%   General:  No distress Lungs:  Clear Heart:  regular rate and rhythm, no murmur Abdomen:  Positive bowel sounds, no rebound no guarding Extremities:  Right wrist with slight swelling but no bleeding or bruising.    LABS: Lab Results  Component Value Date   TROPONINI 0.05* 04/03/2015   Results for orders placed or performed during the hospital encounter of 04/02/15 (from the past 24 hour(s))  POCT Activated clotting time     Status: None   Collection Time: 04/03/15 10:13 AM  Result Value Ref Range   Activated Clotting Time 509 seconds  Glucose, capillary     Status: Abnormal   Collection Time: 04/03/15 11:11 AM  Result Value Ref Range   Glucose-Capillary 209 (H) 65 - 99 mg/dL  Glucose, capillary     Status: Abnormal   Collection Time: 04/03/15  4:59 PM  Result Value Ref Range   Glucose-Capillary 263 (H) 65 - 99 mg/dL  Glucose, capillary     Status: Abnormal   Collection Time: 04/03/15  9:33 PM  Result Value Ref Range   Glucose-Capillary 292 (H) 65 - 99 mg/dL   Comment 1 Notify RN    Comment 2 Document in Chart   Basic metabolic panel     Status: Abnormal   Collection Time: 04/04/15  4:00 AM  Result Value Ref Range   Sodium 136 135 - 145 mmol/L   Potassium 3.9 3.5 - 5.1 mmol/L   Chloride 103 101 - 111 mmol/L   CO2 26 22 - 32 mmol/L   Glucose, Bld 211 (H) 65 - 99 mg/dL   BUN 11 6 - 20 mg/dL   Creatinine, Ser 0.93 0.44 - 1.00 mg/dL   Calcium 8.7 (L) 8.9 - 10.3 mg/dL   GFR calc non Af Amer >60 >60 mL/min   GFR calc Af Amer >60 >60 mL/min   Anion gap 7 5 - 15  CBC      Status: Abnormal   Collection Time: 04/04/15  4:00 AM  Result Value Ref Range   WBC 6.6 4.0 - 10.5 K/uL   RBC 3.92 3.87 - 5.11 MIL/uL   Hemoglobin 11.7 (L) 12.0 - 15.0 g/dL   HCT 35.6 (L) 36.0 - 46.0 %   MCV 90.8 78.0 - 100.0 fL   MCH 29.8 26.0 - 34.0 pg   MCHC 32.9 30.0 - 36.0 g/dL   RDW 13.2 11.5 - 15.5 %   Platelets 206 150 - 400 K/uL  Glucose, capillary     Status: Abnormal   Collection Time: 04/04/15  6:48 AM  Result Value Ref Range   Glucose-Capillary 265 (H) 65 - 99 mg/dL   Comment 1 Notify RN    Comment 2 Document in Chart     Intake/Output Summary (Last 24 hours) at 04/04/15 0808 Last data filed at 04/03/15 2134  Gross per 24 hour  Intake   1120 ml  Output      0 ml  Net   1120 ml     ASSESSMENT AND PLAN:  UNSTABLE ANGINA/NSTEMI:  Status PCI/stent to 90% circ stenosis.  Interval occlusion of the OM.  This was left alone.  On ASA and Plavix.  Note there is p2y12 that was 218 but this was before Plavix and not reflective.   DES stent Plavix and ASA x  12 months.    OK to discharge.     Jeneen Rinks [facility] 04/04/2015 8:08 AM

## 2015-04-04 NOTE — Discharge Instructions (Signed)

## 2015-04-04 NOTE — Discharge Summary (Signed)
Discharge Summary   Patient ID: ELIOT BERTOLUCCI MRN: [id number], DOB/AGE: 28-Nov-1945 69 y.o. Admit date: 04/02/2015 D/C date:     04/04/2015  Primary Cardiologist: Dr Bronson Ing  Discharge Diagnosis:  Principal Problem:   NSTEMI (non-ST elevated myocardial infarction) [facility]) Active Problems:   Essential hypertension   DM (diabetes mellitus), type 2, uncontrolled ([place])   Admission Dates: 04/02/2015  HPI: Virginia Huber is a 69 y.o. female with a history of  CAD/MI, s/p PCI of the OM 11/2014, DM, dyslipidemia, HTN. She was seen in the office for exertional chest pain. Her troponin was elevated, so she was admitted to [facility] for further evaluation and cath.  Hospital Course She remained pain-free on medical therapy. Cardiac catheterization was performed, results below. She had an occluded OM (previous PCI site) and her CFX disease had progressed to 90%. This was treated with PTCA and a DES.   The patient has had an uncomplicated hospital course and is recovering well. The radial catheter site is stable He / She has been seen by Dr. Percival Spanish today and deemed ready for discharge home. She was seen by cardiac rehab and will follow up with them as an outpatient. All follow-up appointments have been scheduled. Discharge medications are listed below.   Discharge Vitals: Blood pressure 146/71, pulse 71, temperature 98.1 F (36.7 C), temperature source Oral, resp. rate 16, height 5\' 3"  (1.6 m), weight 154 lb 1.6 oz (69.9 kg), SpO2 97 %.  Labs: Lab Results  Component Value Date   WBC 6.6 04/04/2015   HGB 11.7* 04/04/2015   HCT 35.6* 04/04/2015   MCV 90.8 04/04/2015   PLT 206 04/04/2015    Recent Labs Lab 04/02/15 1857  04/04/15 0400  NA 139  < > 136  K 4.3  < > 3.9  CL 109  < > 103  CO2 23  < > 26  BUN 16  < > 11  CREATININE 1.20*  < > 0.93  CALCIUM 9.1  < > 8.7*  PROT 5.9*  --   --   BILITOT 0.4  --   --   ALKPHOS 58  --   --   ALT 15  --   --   AST 17  --   --   GLUCOSE  178*  < > 211*  < > = values in this interval not displayed.  Recent Labs  04/02/15 1427 04/02/15 1857 04/02/15 2318 04/03/15 0548  TROPONINI 0.09* 0.07* 0.05* 0.05*   Lab Results  Component Value Date   CHOL 133 04/03/2015   HDL 23* 04/03/2015   LDLCALC UNABLE TO CALCULATE IF TRIGLYCERIDE OVER 400 mg/dL 04/03/2015   TRIG 617* 04/03/2015   Lab Results  Component Value Date   HGBA1C 8.6* 04/02/2015    Cardiac Studies/Procedures   Post Atrio lesion, 15% stenosed.  1st Mrg lesion, 100% stenosed.  Prox Cx to Mid Cx lesion, 90% stenosed.  Dist Cx lesion, 30% stenosed.  Dist LAD lesion, 50% stenosed. Assessment: 1) Interval occlusion of previously OM1 PTCA site 2) CAD with high grade lesion in proximal to mid LCX 3) Otherwise non-obstructive CAD 4) LVEDP = 13 Plan:PCI of LCX with Dr. Burt Knack.   PCI Note  Mid Cx lesion, 95% stenosed. Post intervention, there is a 0% residual stenosis. Successful single vessel PCI of the left circumflex using a 3.0 x 18 mm Xience DES Recommend: DAPT with ASA and plavix x one year minimum, then indefinite ASA 81 mg daily.  Discharge Medications  Medication List    STOP taking these medications        cephALEXin 500 MG capsule  Commonly known as:  KEFLEX      TAKE these medications        amLODipine 10 MG tablet  Commonly known as:  NORVASC  TAKE ONE (1) TABLET EACH DAY  Notes to Patient:  Blood pressure     aspirin EC 81 MG tablet  Take 1 tablet (81 mg total) by mouth daily.  Notes to Patient:  Prevents clotting in stent and heart attack     atorvastatin 10 MG tablet  Commonly known as:  LIPITOR  Take 1 tablet (10 mg total) by mouth daily at 6 PM.  Notes to Patient:  Cholesterol     cholecalciferol 1000 UNITS tablet  Commonly known as:  VITAMIN D  Take 1,000 Units by mouth at bedtime.     clopidogrel 75 MG tablet  Commonly known as:  PLAVIX  Take 1 tablet (75 mg total) by mouth daily.  Notes to Patient:   Prevents clotting in stent and heart attack     Fish Oil 1200 MG Caps  Take 1 capsule (1,200 mg total) by mouth daily.  Notes to Patient:  Cholesterol      furosemide 20 MG tablet  Commonly known as:  LASIX  Take 1 tablet (20 mg total) by mouth as needed for edema (leg swelling).  Notes to Patient:  Fluid pill     insulin NPH Human 100 UNIT/ML injection  Commonly known as:  HUMULIN N,NOVOLIN N  Inject 45 Units into the skin 2 (two) times daily before a meal.     labetalol 300 MG tablet  Commonly known as:  NORMODYNE  TAKE ONE TABLET TWICE DAILY     levothyroxine 137 MCG tablet  Commonly known as:  SYNTHROID, LEVOTHROID  Take 137 mcg by mouth daily before breakfast.     losartan 100 MG tablet  Commonly known as:  COZAAR  TAKE ONE (1) TABLET BY MOUTH EVERY DAY  Notes to Patient:  HOLD for 48 hours, restart on 04/07/2015     metFORMIN 500 MG tablet  Commonly known as:  GLUCOPHAGE  Take 500-1,000 mg by mouth 2 (two) times daily with a meal. Takes 1000mg  in the morning and 500mg  at night     nitroGLYCERIN 0.4 MG SL tablet  Commonly known as:  NITROSTAT  Place 1 tablet (0.4 mg total) under the tongue every 5 (five) minutes as needed for chest pain.     pantoprazole 40 MG tablet  Commonly known as:  PROTONIX  Take 1 tablet (40 mg total) by mouth at bedtime.     potassium chloride 10 MEQ tablet  Commonly known as:  K-DUR  Take 1 tablet (10 mEq total) by mouth as needed (on days that you take your Lasix).     ranolazine 500 MG 12 hr tablet  Commonly known as:  RANEXA  Take 500 mg by mouth 2 (two) times daily.     rOPINIRole 0.5 MG tablet  Commonly known as:  REQUIP  Take 0.5 mg by mouth at bedtime.        Disposition   The patient will be discharged in stable condition to home. Discharge Instructions    Amb Referral to Cardiac Rehabilitation    Complete by:  As directed   Congestive Heart Failure: If diagnosis is Heart Failure, patient MUST meet each of the CMS  criteria: 1. Left Ventricular Ejection Fraction </=  35% 2. NYHA class II-IV symptoms despite being on optimal heart failure therapy for at least 6 weeks. 3. Stable = have not had a recent (<6 weeks) or planned (<6 months) major cardiovascular hospitalization or procedure  Program Details: - Physician supervised classes - 1-3 classes per week over a 12-18 week period, generally for a total of 36 sessions  Physician Certification: I certify that the above Cardiac Rehabilitation treatment is medically necessary and is medically approved by me for treatment of this patient. The patient is willing and cooperative, able to ambulate and medically stable to participate in exercise rehabilitation. The participant's progress and Individualized Treatment Plan will be reviewed by the Medical Director, Cardiac Rehab staff and as indicated by the Referring/Ordering Physician.  Diagnosis:   PCI Myocardial Infarction       Diet - low sodium heart healthy    Complete by:  As directed      Diet Carb Modified    Complete by:  As directed      Increase activity slowly    Complete by:  As directed           Follow-up Information    Follow up with Jory Sims, NP On 04/12/2015.   Specialties:  Nurse Practitioner, Radiology, Cardiology   Why:  See provider at 1:30 pm, please arrive 15 minutes early for paperwork.    Contact information:   [place] [address] [phone]         Duration of Discharge Encounter: Greater than 30 minutes including physician and PA time.  Signed, Lenoard Aden 04/04/2015, 11:47 AM

## 2015-04-04 NOTE — Progress Notes (Signed)
CARDIAC REHAB PHASE I   PRE:  Rate/Rhythm: 62 SR    BP: sitting 142/48    SaO2:   MODE:  Ambulation: 1000 ft   POST:  Rate/Rhythm: 80 SR    BP: sitting 146/71     SaO2:   Tolerated well with long distance. Tired toward end but no angina. Ed reviewed and pt is interested in doing [facility] again (just graduated). Will send referral to [place]. Understands importance of Plavix/ASA. [id number]   Darrick Meigs CES, ACSM 04/04/2015 8:16 AM

## 2015-04-05 ENCOUNTER — Encounter (HOSPITAL_COMMUNITY): Payer: PPO

## 2015-04-08 ENCOUNTER — Encounter (HOSPITAL_COMMUNITY): Payer: PPO

## 2015-04-08 NOTE — Progress Notes (Signed)
Cardiac Rehabilitation Program Outcomes Report   Orientation:  12/20/14 Graduate Date:  04/01/15 Discharge Date:  04/01/15 # of sessions completed: 36  Cardiologist: Bronson Ing Family MD:  Bethann Berkshire Time:  0930  A.  Exercise Program:  Tolerates exercise @ 4.00 METS for 15 minutes, Walk Test Results:  Post: 2.82 mets, Improved functional capacity  15.1 %, Improved  muscular strength  7.95 % and Improved education score 100 %  B.  Mental Health:  Good mental attitude and Quality of Life (QOL)  improvements:  Overall  15.32 %, Health/Functioning 47.5 %, Socioeconomics -2.6 change %, Psych/Spiritual 7.69 %, Family -17.39 change %   PHQ9 score at d/c was 0  C.  Education/Instruction/Skills  Accurately checks own pulse.  Rest:  61  Exercise:  84, Knows THR for exercise, Uses Perceived Exertion Scale and/or Dyspnea Scale and Attended 13 education classes  Home exercise given: 04/01/15  D.  Nutrition/Weight Control/Body Composition:  Adherence to prescribed nutrition program: good    E.  Blood Lipids    Lab Results  Component Value Date   CHOL 133 04/03/2015   HDL 23* 04/03/2015   LDLCALC UNABLE TO CALCULATE IF TRIGLYCERIDE OVER 400 mg/dL 04/03/2015   TRIG 617* 04/03/2015   CHOLHDL 5.8 04/03/2015    F.  Lifestyle Changes:  Making positive lifestyle changes   Never smoker  G.  Symptoms noted with exercise:  Patient experienced cp with exercising 03/29/15 at session 35.  Cardiologist appointment made by staff prior to leaving [facility].   Report Completed By:  Stevphen Rochester RN   Comments:  This is the patients graduation progress note for [facility].  Patient done very well in the program.

## 2015-04-08 NOTE — Progress Notes (Signed)
Patient is discharged from [facility] program today, April 01, 2015 with 36 sessions.  She achieved LTG of 30 minutes of aerobic exercise at max met level of 4.00.  All patient vitals are WNL.  Patient has met with dietician.  Discharge instructions have been reviewed in detail and patient expressed an understanding of material given.  Patient plans to exercise at home. Cardiac Rehab will make 1 month, 6 month and 1 year call backs.  Patient had no complaints of any abnormal S/S or pain on their exit visit.  Patient finished post walk test.

## 2015-04-10 ENCOUNTER — Encounter (HOSPITAL_COMMUNITY): Payer: PPO

## 2015-04-12 ENCOUNTER — Encounter: Payer: PPO | Admitting: Adult Health

## 2015-04-12 ENCOUNTER — Encounter: Payer: Self-pay | Admitting: Adult Health

## 2015-04-12 ENCOUNTER — Encounter (HOSPITAL_COMMUNITY): Payer: PPO

## 2015-04-12 NOTE — Patient Instructions (Signed)
Your physician recommends that you schedule a follow-up appointment with Dr. Bronson Ing  Your physician recommends that you continue on your current medications as directed. Please refer to the Current Medication list given to you today.  If you need a refill on your cardiac medications before your next appointment, please call your pharmacy.  Thank you for choosing [facility]!

## 2015-04-12 NOTE — Progress Notes (Signed)
Cardiology Office Note   Date:  04/12/2015   ID:  Virginia Huber, Virginia Huber Dec 18, 1945, MRN [id number]  PCP:  Purvis Kilts, MD  Cardiologist: [place]/  Jory Sims, NP   Chief Complaint  Patient presents with  . Coronary Artery Disease      History of Present Illness: Virginia Huber is a 69 y.o. female who presents for ongoing assessment and management of CAD/MI, s/p PCI of the OM 11/2014. Seen by Craig Staggers 10/07 for chest pain that had started at cardiac rehab, started on Ranexa and Protonix. She was admitted to [facility] for NSTEMI and underwent cardiac cath She had an occluded OM (previous PCI site) and her CFX disease had progressed to 90%. This was treated with PTCA and a DES.    Post Atrio lesion, 15% stenosed.  1st Mrg lesion, 100% stenosed.  Prox Cx to Mid Cx lesion, 90% stenosed.  Dist Cx lesion, 30% stenosed.  Dist LAD lesion, 50% stenosed. Assessment: 1) Interval occlusion of previously OM1 PTCA site 2) CAD with high grade lesion in proximal to mid LCX 3) Otherwise non-obstructive CAD 4) LVEDP = 13 Plan:PCI of LCX with Dr. Burt Knack.   PCI Note  Mid Cx lesion, 95% stenosed. Post intervention, there is a 0% residual stenosis. Successful single vessel PCI of the left circumflex using a 3.0 x 18 mm Xience DES Recommend: DAPT with ASA and plavix x one year minimum, then indefinite ASA 81 mg daily.  She is feeling much better. Completely asymptomatic now. She is walking 15 minutes daily and is planning on going back to cardiac rehab. She would like to stop Ranexa.    Past Medical History  Diagnosis Date  . HTN (hypertension)   . Heart block   . Hypothyroidism   . Ovarian tumor   . Coronary artery disease 11/30/2014    PCI to the OM1  . UTI (urinary tract infection) 05/08/2013  . Superficial fungus infection of skin 06/29/2013  . TIA (transient ischemic attack) 08/2001; ~ 2006  . GERD (gastroesophageal reflux disease)   . Arthritis   . Hypercholesteremia   .  Anginal pain ([place])   . Myocardial infarction ([place]) 02/2012  . NSTEMI (non-ST elevated myocardial infarction) ([place]) 04/02/2015    3.0x18 mm Xience DES to the CFX  . Pneumonia 02/2012  . Type II diabetes mellitus ([place])   . Cutaneous lupus erythematosus   . History of blood transfusion 1980's    2nd surgical procedures  . Progressive angina ([place]) 11/2014    PTCA w/ scoring balloon to the OM1    Past Surgical History  Procedure Laterality Date  . 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 Partial hysterectomy  1970's    left ovaries, then ovaries removed later due tumors   . Left oophorectomy  05/1979    nicked bowel, peritonitis, colostomy; colostomy reversed 1981   . Nuclear med stress test  10/2011    Small area of mild ischemia inferoapically.  . Abdominal aortagram N/A 01/03/2014    Procedure: ABDOMINAL [name];  Surgeon: Wellington Hampshire, MD;  Location: [facility] CATH LAB;  Service: Cardiovascular;  Laterality: N/A;  . Lower extremity angiogram N/A 01/03/2014    Procedure: LOWER EXTREMITY ANGIOGRAM;  Surgeon: Wellington Hampshire, MD;  Location: [facility] CATH LAB;  Service: Cardiovascular;  Laterality: N/A;  . Colostomy reversal  11/1979  . Colonoscopy  2005    Dr. Laural Golden: pancolonic divericula, polyp, path unknown currently  . Appendectomy  1970's  . Cholecystectomy open  1990's  .  Excisional hemorrhoidectomy  1970's  . Abdominal hysterectomy  1972    "partial"  . 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 Eye surgery Left ~ 2001    "smoothed out wrinkle"  . Cardiac catheterization  2008    Tiny OM-2 with 90% narrowing. Med tx.  . Cardiac catheterization N/A 11/30/2014    Procedure: Left Heart Cath and Coronary Angiography;  Surgeon: Troy Sine, MD; LAD 20%, CFX 50%, OM1 95%, right PLB 30%, LV normal   . Cardiac catheterization N/A 11/30/2014    Procedure: Coronary Balloon Angioplasty;  Surgeon: Troy Sine, MD;  Angiosculpt scoring balloon and PTCA to the OM1 reducing stenosis from 95% to less  than 10%  . Right oophorectomy  1970's  . Cardiac catheterization N/A 04/03/2015    Procedure: Left Heart Cath and Coronary Angiography;  Surgeon: Jolaine Artist, MD; dLAD 50%, CFX 90%, OM1 100%, PLA 15%, LVEDP 13    . Cardiac catheterization N/A 04/03/2015    Procedure: Coronary Stent Intervention;  Surgeon: Sherren Mocha, MD; 3.0x18 mm Xience DES to the CFX       Current Outpatient Prescriptions  Medication Sig Dispense Refill  . amLODipine (NORVASC) 10 MG tablet TAKE ONE (1) TABLET EACH DAY 90 tablet 3  . aspirin EC 81 MG tablet Take 1 tablet (81 mg total) by mouth daily.    [name] atorvastatin (LIPITOR) 10 MG tablet Take 1 tablet (10 mg total) by mouth daily at 6 PM. 90 tablet 3  . cholecalciferol (VITAMIN D) 1000 UNITS tablet Take 1,000 Units by mouth at bedtime.     . clopidogrel (PLAVIX) 75 MG tablet Take 1 tablet (75 mg total) by mouth daily. 30 tablet 6  . furosemide (LASIX) 20 MG tablet Take 1 tablet (20 mg total) by mouth as needed for edema (leg swelling). 30 tablet 3  . insulin NPH Human (HUMULIN N,NOVOLIN N) 100 UNIT/ML injection Inject 45 Units into the skin 2 (two) times daily before a meal.     . labetalol (NORMODYNE) 300 MG tablet TAKE ONE TABLET TWICE DAILY 180 tablet 3  . levothyroxine (SYNTHROID, LEVOTHROID) 137 MCG tablet Take 137 mcg by mouth daily before breakfast.    . losartan (COZAAR) 100 MG tablet TAKE ONE (1) TABLET BY MOUTH EVERY DAY 90 tablet 3  . metFORMIN (GLUCOPHAGE) 500 MG tablet Take 500-1,000 mg by mouth 2 (two) times daily with a meal. Takes 1000mg  in the morning and 500mg  at night    . nitroGLYCERIN (NITROSTAT) 0.4 MG SL tablet Place 1 tablet (0.4 mg total) under the tongue every 5 (five) minutes as needed for chest pain. 25 tablet 12  . Omega-3 Fatty Acids (FISH OIL) 1200 MG CAPS Take 1 capsule (1,200 mg total) by mouth daily.    . pantoprazole (PROTONIX) 40 MG tablet Take 1 tablet (40 mg total) by mouth at bedtime. 30 tablet 11  . potassium chloride  (K-DUR) 10 MEQ tablet Take 1 tablet (10 mEq total) by mouth as needed (on days that you take your Lasix). 30 tablet 3  . ranolazine (RANEXA) 500 MG 12 hr tablet Take 500 mg by mouth 2 (two) times daily.    [name] rOPINIRole (REQUIP) 0.5 MG tablet Take 0.5 mg by mouth at bedtime.   0   No current facility-administered medications for this visit.    Allergies:   Penicillins and Percocet    Social History:  The patient  reports that she has never smoked. She  has never used smokeless tobacco. She reports that she does not drink alcohol or use illicit drugs.   Family History:  The patient's family history includes Diabetes in her brother; Heart disease in her brother, brother, father, mother, and sister; Thyroid disease in her brother and brother. There is no history of Colon cancer.    ROS: All other systems are reviewed and negative. Unless otherwise mentioned in H&P    PHYSICAL EXAM: VS:  Pulse 65  Ht 5\' 2"  (1.575 m)  Wt 151 lb (68.493 kg)  BMI 27.61 kg/m2  SpO2 98% , BMI Body mass index is 27.61 kg/(m^2). GEN: Well nourished, well developed, in no acute distress HEENT: normal Neck: no JVD, carotid bruits, or masses Cardiac: RRR; no murmurs, rubs, or gallops,no edema  Respiratory:  clear to auscultation bilaterally, normal work of breathing GI: soft, nontender, nondistended, + BS MS: no deformity or atrophyBruising noted at the catheter insertion site  Skin: warm and dry, no rash Neuro:  Strength and sensation are intact Psych: euthymic mood, full affect   Recent Labs: 04/02/2015: ALT 15 04/04/2015: BUN 11; Creatinine, Ser 0.93; Hemoglobin 11.7*; Platelets 206; Potassium 3.9; Sodium 136    Lipid Panel    Component Value Date/Time   CHOL 133 04/03/2015 0548   TRIG 617* 04/03/2015 0548   HDL 23* 04/03/2015 0548   CHOLHDL 5.8 04/03/2015 0548   VLDL UNABLE TO CALCULATE IF TRIGLYCERIDE OVER 400 mg/dL 04/03/2015 0548   LDLCALC UNABLE TO CALCULATE IF TRIGLYCERIDE OVER 400 mg/dL  04/03/2015 0548      Wt Readings from Last 3 Encounters:  04/12/15 151 lb (68.493 kg)  04/04/15 154 lb 1.6 oz (69.9 kg)  04/02/15 151 lb 9.6 oz (68.765 kg)     ASSESSMENT AND PLAN:  1.  CAD: She had an angioplasty of the Cx on 11/30/2014 but had recurrent chest pain. She was treated with Ranexa but continued to have heartburn pain. Was sent for cath on 04/03/2015 which found restenosis of Cx lesion. She now has DES to the Cx. She is completely without symptoms. She will continue on DAPT with ASA and Plavix. I have counseled her on lifting over 20 lbs for the next month and to pace herself concerning exercise until she restarts cardiac rehab. She will see Dr. Bronson Ing in one month on a previously schedule appt.   2. Hypertension: Excellent control of BP at this time. No changes in her medication regimen.   3. Angina: She wishes to stop taking Ranexa as she is now completely symptom free. Will hold Ranexa at this time and evaluate her response.    Current medicines are reviewed at length with the patient today.    Labs/ tests ordered today include:  No orders of the defined types were placed in this encounter.     Disposition:   FU with Dr. Bronson Ing in one month on previously scheduled appointment.    Signed, Jory Sims, NP  04/12/2015 1:12 PM    [facility] [address], [place], [place] [address] Phone: [phone]; Fax: [phone]

## 2015-04-12 NOTE — Progress Notes (Signed)
Name: Virginia Huber    DOB: Dec 26, 1945  Age: 69 y.o.  MR#: [id number]       PCP:  Purvis Kilts, MD      Insurance: Payor: Tennis Must / Plan: Tennis Must / Product Type: *No Product type* /   CC:    Chief Complaint  Patient presents with  . Coronary Artery Disease    VS Filed Vitals:   04/12/15 1311  BP: 122/74  Pulse: 65  Height: 5\' 2"  (1.575 m)  Weight: 151 lb (68.493 kg)  SpO2: 98%    Weights Current Weight  04/12/15 151 lb (68.493 kg)  04/04/15 154 lb 1.6 oz (69.9 kg)  04/02/15 151 lb 9.6 oz (68.765 kg)    Blood Pressure  BP Readings from Last 3 Encounters:  04/12/15 122/74  04/04/15 146/71  04/02/15 122/54     Admit date:  (Not on file) Last encounter with RMR:  03/29/2015   Allergy Penicillins and Percocet  Current Outpatient Prescriptions  Medication Sig Dispense Refill  . amLODipine (NORVASC) 10 MG tablet TAKE ONE (1) TABLET EACH DAY 90 tablet 3  . aspirin EC 81 MG tablet Take 1 tablet (81 mg total) by mouth daily.    [name] atorvastatin (LIPITOR) 10 MG tablet Take 1 tablet (10 mg total) by mouth daily at 6 PM. 90 tablet 3  . cholecalciferol (VITAMIN D) 1000 UNITS tablet Take 1,000 Units by mouth at bedtime.     . clopidogrel (PLAVIX) 75 MG tablet Take 1 tablet (75 mg total) by mouth daily. 30 tablet 6  . furosemide (LASIX) 20 MG tablet Take 1 tablet (20 mg total) by mouth as needed for edema (leg swelling). 30 tablet 3  . insulin NPH Human (HUMULIN N,NOVOLIN N) 100 UNIT/ML injection Inject 45 Units into the skin 2 (two) times daily before a meal.     . labetalol (NORMODYNE) 300 MG tablet TAKE ONE TABLET TWICE DAILY 180 tablet 3  . levothyroxine (SYNTHROID, LEVOTHROID) 137 MCG tablet Take 137 mcg by mouth daily before breakfast.    . losartan (COZAAR) 100 MG tablet TAKE ONE (1) TABLET BY MOUTH EVERY DAY 90 tablet 3  . metFORMIN (GLUCOPHAGE) 500 MG tablet Take 500-1,000 mg by mouth 2 (two) times daily with a meal. Takes 1000mg  in the  morning and 500mg  at night    . nitroGLYCERIN (NITROSTAT) 0.4 MG SL tablet Place 1 tablet (0.4 mg total) under the tongue every 5 (five) minutes as needed for chest pain. 25 tablet 12  . Omega-3 Fatty Acids (FISH OIL) 1200 MG CAPS Take 1 capsule (1,200 mg total) by mouth daily.    . pantoprazole (PROTONIX) 40 MG tablet Take 1 tablet (40 mg total) by mouth at bedtime. 30 tablet 11  . potassium chloride (K-DUR) 10 MEQ tablet Take 1 tablet (10 mEq total) by mouth as needed (on days that you take your Lasix). 30 tablet 3  . ranolazine (RANEXA) 500 MG 12 hr tablet Take 500 mg by mouth 2 (two) times daily.    [name] rOPINIRole (REQUIP) 0.5 MG tablet Take 0.5 mg by mouth at bedtime.   0   No current facility-administered medications for this visit.    Discontinued Meds:   There are no discontinued medications.  Patient Active Problem List   Diagnosis Date Noted  . NSTEMI (non-ST elevated myocardial infarction) ([place]) 04/02/2015  . CAD (coronary artery disease), native coronary artery 11/30/2014  . 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 PAD (peripheral artery disease) ([place]) 12/26/2013  . Superficial fungus infection of skin 06/29/2013  . UTI (urinary tract infection) 05/08/2013  . Hypokalemia 03/05/2012  . B12 deficiency anemia 03/02/2012  . Bronchospasm 03/02/2012  . Community acquired bacterial pneumonia 03/01/2012  . Acute respiratory failure ([place]) 03/01/2012  . DM (diabetes mellitus), type 2, uncontrolled ([place]) 02/29/2012  . Paroxysmal atrial fibrillation ([place]) 02/28/2012  . Hypothyroid 02/28/2012  . RLQ abdominal pain 11/24/2010  . OVERWEIGHT/OBESITY 06/03/2010  . Essential hypertension 06/03/2010  . Hyperlipidemia 12/27/2009  . CAD, NATIVE VESSEL 05/17/2009  . PALPITATIONS 05/17/2009  . FRACTURE, TOE 12/06/2007    LABS    Component Value Date/Time   NA 136 04/04/2015 0400   NA 136 04/03/2015 0548   NA 139 04/02/2015  1857   K 3.9 04/04/2015 0400   K 3.8 04/03/2015 0548   K 4.3 04/02/2015 1857   CL 103 04/04/2015 0400   CL 104 04/03/2015 0548   CL 109 04/02/2015 1857   CO2 26 04/04/2015 0400   CO2 25 04/03/2015 0548   CO2 23 04/02/2015 1857   GLUCOSE 211* 04/04/2015 0400   GLUCOSE 170* 04/03/2015 0548   GLUCOSE 178* 04/02/2015 1857   BUN 11 04/04/2015 0400   BUN 13 04/03/2015 0548   BUN 16 04/02/2015 1857   CREATININE 0.93 04/04/2015 0400   CREATININE 1.02* 04/03/2015 0548   CREATININE 1.20* 04/02/2015 1857   CREATININE 1.03 08/28/2011 0146   CREATININE 0.72 11/24/2010 1013   CALCIUM 8.7* 04/04/2015 0400   CALCIUM 8.6* 04/03/2015 0548   CALCIUM 9.1 04/02/2015 1857   GFRNONAA >60 04/04/2015 0400   GFRNONAA 55* 04/03/2015 0548   GFRNONAA 45* 04/02/2015 1857   GFRAA >60 04/04/2015 0400   GFRAA >60 04/03/2015 0548   GFRAA 52* 04/02/2015 1857   CMP     Component Value Date/Time   NA 136 04/04/2015 0400   K 3.9 04/04/2015 0400   CL 103 04/04/2015 0400   CO2 26 04/04/2015 0400   GLUCOSE 211* 04/04/2015 0400   BUN 11 04/04/2015 0400   CREATININE 0.93 04/04/2015 0400   CREATININE 1.03 08/28/2011 0146   CALCIUM 8.7* 04/04/2015 0400   PROT 5.9* 04/02/2015 1857   ALBUMIN 3.4* 04/02/2015 1857   AST 17 04/02/2015 1857   ALT 15 04/02/2015 1857   ALKPHOS 58 04/02/2015 1857   BILITOT 0.4 04/02/2015 1857   GFRNONAA >60 04/04/2015 0400   GFRAA >60 04/04/2015 0400       Component Value Date/Time   WBC 6.6 04/04/2015 0400   WBC 6.9 04/03/2015 0548   WBC 7.7 04/02/2015 1857   HGB 11.7* 04/04/2015 0400   HGB 11.5* 04/03/2015 0548   HGB 12.0 04/02/2015 1857   HCT 35.6* 04/04/2015 0400   HCT 34.7* 04/03/2015 0548   HCT 36.5 04/02/2015 1857   MCV 90.8 04/04/2015 0400   MCV 91.6 04/03/2015 0548   MCV 90.6 04/02/2015 1857    Lipid Panel     Component Value Date/Time   CHOL 133 04/03/2015 0548   TRIG 617* 04/03/2015 0548   HDL 23* 04/03/2015 0548   CHOLHDL 5.8 04/03/2015 0548   VLDL  UNABLE TO CALCULATE IF TRIGLYCERIDE OVER 400 mg/dL 04/03/2015 0548   LDLCALC UNABLE TO CALCULATE IF TRIGLYCERIDE OVER 400 mg/dL 04/03/2015 0548    ABG    Component Value Date/Time   PHART 7.415 03/01/2012 2125   PCO2ART 32.4* 03/01/2012 2125   PO2ART 59.8* 03/01/2012 2125   HCO3  20.4 03/01/2012 2125   TCO2 22 02/15/2015 1344   ACIDBASEDEF 3.4* 03/01/2012 2125   O2SAT 93.1 03/01/2012 2125     Lab Results  Component Value Date   TSH 2.000 02/28/2012   BNP (last 3 results) No results for input(s): BNP in the last 8760 hours.  ProBNP (last 3 results) No results for input(s): PROBNP in the last 8760 hours.  Cardiac Panel (last 3 results) No results for input(s): CKTOTAL, CKMB, TROPONINI, RELINDX in the last 72 hours.  Iron/TIBC/Ferritin/ %Sat    Component Value Date/Time   IRON 16* 03/02/2012 0854   TIBC 211* 03/02/2012 0854   FERRITIN 433* 03/02/2012 0854   IRONPCTSAT 8* 03/02/2012 0854     EKG Orders placed or performed during the hospital encounter of 04/02/15  . EKG 12-Lead  . EKG 12-Lead  . EKG 12-Lead  . EKG 12-Lead  . EKG 12-Lead  . EKG     Prior Assessment and Plan Problem List as of 04/12/2015      Cardiovascular and Mediastinum   CAD, NATIVE VESSEL   Last Assessment & Plan 11/06/2014 Office Visit Written 11/06/2014  5:15 PM by Wellington Hampshire, MD    She has no symptoms suggestive of angina. Continue medical therapy.      Essential hypertension   Last Assessment & Plan 11/06/2014 Office Visit Written 11/06/2014  5:15 PM by Wellington Hampshire, MD    Blood pressure is well controlled on current medication.      Paroxysmal atrial fibrillation [facility])   Last Assessment & Plan 11/01/2012 Office Visit Written 11/01/2012 10:46 AM by Renella Cunas, MD    No clinical recurrence.      PAD (peripheral artery disease) [facility])   Last Assessment & Plan 11/06/2014 Office Visit Written 11/06/2014  5:15 PM by Wellington Hampshire, MD    Claudication overall improved with  cilostazol and a walking program. Current hip pain seems to be due to bursitis and not PAD. There was no evidence of aortoiliac disease.      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 ([facility])   CAD (coronary artery disease), native coronary artery   NSTEMI (non-ST elevated myocardial infarction) ([place])     Respiratory   Community acquired bacterial pneumonia   Acute respiratory failure ([facility])   Bronchospasm     Endocrine   Hypothyroid   DM (diabetes mellitus), type 2, uncontrolled ([place])     Musculoskeletal and Integument   FRACTURE, TOE     Genitourinary   UTI (urinary tract infection)     Other   Hyperlipidemia   Last Assessment & Plan 11/06/2014 Office Visit Written 11/06/2014  5:16 PM by Wellington Hampshire, MD    Continue treatment with atorvastatin with a target LDL of less than 70.       PALPITATIONS   OVERWEIGHT/OBESITY   Last Assessment & Plan 05/05/2011 Office Visit Written 05/05/2011 11:37 AM by Renella Cunas, MD    Markedly improved. Continue to keep weight off.      RLQ abdominal pain   Last Assessment & Plan 11/24/2010 Office Visit Edited 12/04/2010  2:20 PM by Orvil Feil, NP    69 year old female with several month hx of RLQ pain, achy, intermittent in past, now constant for past several weeks. Not relieved by anything. Exacerbated by movement. No change in bowel habits. Has BM 3-4X per day. No melena or brbpr. Afebrile, no chills. No lack of appetite; does have  purposeful wt loss secondary to diagnosis of diabetes. Has had multiple abdominal surgeries in past: see PMH/PSH. 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 Unknown path report at this time; requesting records.  Likely needs updated colonoscopy, but we will proceed with CT abd/pelvis to assess for other etiology prior to invasive procedure. Differentials include adhesive disease, less likely diverticular origin, functional abdominal pain.   Proceed  with CT abd/pelvis Creatinine prior; hold metformin for 48 hours after scan Obtain path from polyp in 2005 Likely needs updated colonoscopy; will review CT first The R/B/A have been discussed in detail with pt regarding a colonoscopy; she states understanding and is willing to proceed if necessary.   ADDENDUM: ifobt +, CBC nl. Pt scheduled for 6/15 colonoscopy      B12 deficiency anemia   Hypokalemia   Superficial fungus infection of skin       Imaging: No results found.

## 2015-04-15 ENCOUNTER — Encounter (HOSPITAL_COMMUNITY): Payer: PPO

## 2015-04-17 ENCOUNTER — Encounter (HOSPITAL_COMMUNITY): Payer: PPO

## 2015-04-19 ENCOUNTER — Encounter (HOSPITAL_COMMUNITY): Payer: PPO

## 2015-04-22 ENCOUNTER — Encounter (HOSPITAL_COMMUNITY): Payer: PPO

## 2015-04-23 ENCOUNTER — Encounter (HOSPITAL_COMMUNITY)
Admission: RE | Admit: 2015-04-23 | Discharge: 2015-04-23 | Disposition: A | Discharge status: Home / Self Care | Payer: PPO | Source: Ambulatory Visit | Attending: Cardiovascular Disease | Admitting: Cardiovascular Disease

## 2015-04-23 VITALS — BP 128/50 | HR 65 | Ht 63.0 in | Wt 152.5 lb

## 2015-04-23 DIAGNOSIS — Z955 Presence of coronary angioplasty implant and graft: Secondary | ICD-10-CM | POA: Diagnosis not present

## 2015-04-23 DIAGNOSIS — I2510 Atherosclerotic heart disease of native coronary artery without angina pectoris: Secondary | ICD-10-CM | POA: Insufficient documentation

## 2015-04-23 DIAGNOSIS — I214 Non-ST elevation (NSTEMI) myocardial infarction: Secondary | ICD-10-CM

## 2015-04-23 NOTE — Patient Instructions (Signed)
Pt has finished orientation/education and is scheduled to return CR on 04/24/15 at 0930. Pt has been instructed to arrive to class 15 minutes early for scheduled class. Pt has been instructed to wear comfortable clothing and shoes with rubber soles. Pt has been told to take their medications 1 hour prior to coming to class.  If the patient is not going to attend class, he/she has been instructed to call.

## 2015-04-23 NOTE — Progress Notes (Signed)
Patient arrived for 1st visit/orientation/education at 1330. Patient was referred to CR by Dr. Bronson Ing due to NSTEMI I21.4 and Stent Placement Z95.5. During orientation advised patient on arrival and appointment times what to wear, what to do before, during and after exercise. Reviewed attendance and class policy. Talked about inclement weather and class consultation policy. Pt is scheduled to return Cardiac Rehab on 04/24/15 at 0930. Pt was advised to come to class 5 minutes before class starts. She was also given instructions on meeting with the dietician and attending the Family Structure classes. Pt is eager to get started. Patient was able to complete 6 minute walk test. Patient was measured for the equipment. Discussed equipment safety with patient. Took patient pre-anthropometric measurements. Patient scored 0 on PHQ-2 and 2 on PHQ-9. Patient does not need counseling at this time. Patient finished visit at 1545.

## 2015-04-23 NOTE — Progress Notes (Signed)
Cardiac/Pulmonary Rehab Medication Review by a Pharmacist  Does the patient  feel that his/her medications are working for him/her?  yes  Has the patient been experiencing any side effects to the medications prescribed?  no  Does the patient measure his/her own blood pressure or blood glucose at home?  yes   Does the patient have any problems obtaining medications due to transportation or finances?   no  Understanding of regimen: good Understanding of indications: good Potential of compliance: good  Questions asked to Determine Patient Understanding of Medication Regimen:  1. What is the name of the medication?  2. What is the medication used for?  3. When should it be taken?  4. How much should be taken?  5. How will you take it?  6. What side effects should you report?  Understanding Defined as: Excellent: All questions above are correct Good: Questions 1-4 are correct Fair: Questions 1-2 are correct  Poor: 1 or none of the above questions are correct   Pharmacist comments: Pt is not having any side effects from medications.  No problems obtaining medications.  Pt does check BP and blood sugar at home.    Hart Robinsons A 04/23/2015 2:24 PM

## 2015-04-24 ENCOUNTER — Encounter (HOSPITAL_COMMUNITY): Payer: PPO

## 2015-04-24 ENCOUNTER — Encounter (HOSPITAL_COMMUNITY)
Admission: RE | Admit: 2015-04-24 | Discharge: 2015-04-24 | Disposition: A | Discharge status: Home / Self Care | Payer: PPO | Source: Ambulatory Visit | Attending: Cardiovascular Disease | Admitting: Cardiovascular Disease

## 2015-04-24 DIAGNOSIS — Z9861 Coronary angioplasty status: Secondary | ICD-10-CM | POA: Diagnosis not present

## 2015-04-24 DIAGNOSIS — I2510 Atherosclerotic heart disease of native coronary artery without angina pectoris: Secondary | ICD-10-CM | POA: Insufficient documentation

## 2015-04-24 DIAGNOSIS — I213 ST elevation (STEMI) myocardial infarction of unspecified site: Secondary | ICD-10-CM | POA: Diagnosis present

## 2015-04-26 ENCOUNTER — Encounter (HOSPITAL_COMMUNITY): Payer: PPO

## 2015-04-26 ENCOUNTER — Encounter (HOSPITAL_COMMUNITY)
Admission: RE | Admit: 2015-04-26 | Discharge: 2015-04-26 | Disposition: A | Discharge status: Home / Self Care | Payer: PPO | Source: Ambulatory Visit | Attending: Cardiovascular Disease | Admitting: Cardiovascular Disease

## 2015-04-26 DIAGNOSIS — I213 ST elevation (STEMI) myocardial infarction of unspecified site: Secondary | ICD-10-CM | POA: Diagnosis not present

## 2015-04-29 ENCOUNTER — Encounter (HOSPITAL_COMMUNITY)
Admission: RE | Admit: 2015-04-29 | Discharge: 2015-04-29 | Disposition: A | Discharge status: Home / Self Care | Payer: PPO | Source: Ambulatory Visit | Attending: Cardiovascular Disease | Admitting: Cardiovascular Disease

## 2015-04-29 ENCOUNTER — Encounter (HOSPITAL_COMMUNITY): Payer: PPO

## 2015-04-29 DIAGNOSIS — I213 ST elevation (STEMI) myocardial infarction of unspecified site: Secondary | ICD-10-CM | POA: Diagnosis not present

## 2015-04-29 NOTE — Progress Notes (Signed)
Cardiac Rehabilitation Program Outcomes Report   Orientation:  04/23/15 Graduate Date:  tbd Discharge Date:  tbd # of sessions completed: 3  Cardiologist: Bronson Ing Family MD:  golding Class Time:  0930  A.  Exercise Program:  Tolerates exercise at 2.53 mets for 15 minutes.  Pre Walk Test 2.59 mets  B.  Mental Health:  Good mental attitude and PHQ-9: 2  C.  Education/Instruction/Skills  Accurately checks own pulse.  Rest:  60  Exercise:  70, Knows THR for exercise and Uses Perceived Exertion Scale and/or Dyspnea Scale  Demonstrates accurate pursed lip breathing  D.  Nutrition/Weight Control/Body Composition:  Adherence to prescribed nutrition program: good    E.  Blood Lipids    Lab Results  Component Value Date   CHOL 133 04/03/2015   HDL 23* 04/03/2015   LDLCALC UNABLE TO CALCULATE IF TRIGLYCERIDE OVER 400 mg/dL 04/03/2015   TRIG 617* 04/03/2015   CHOLHDL 5.8 04/03/2015    F.  Lifestyle Changes:  Making positive lifestyle changes  G.  Symptoms noted with exercise:  Asymptomatic  Report Completed By:  Stevphen Rochester RN   Comments:  This is the first week progress note for [facility] CR.

## 2015-05-01 ENCOUNTER — Other Ambulatory Visit: Payer: Self-pay | Admitting: Cardiovascular Disease

## 2015-05-01 ENCOUNTER — Encounter (HOSPITAL_COMMUNITY)
Admission: RE | Admit: 2015-05-01 | Discharge: 2015-05-01 | Disposition: A | Discharge status: Home / Self Care | Payer: PPO | Source: Ambulatory Visit | Attending: Cardiovascular Disease | Admitting: Cardiovascular Disease

## 2015-05-01 DIAGNOSIS — I213 ST elevation (STEMI) myocardial infarction of unspecified site: Secondary | ICD-10-CM | POA: Diagnosis not present

## 2015-05-03 ENCOUNTER — Encounter (HOSPITAL_COMMUNITY)
Admission: RE | Admit: 2015-05-03 | Discharge: 2015-05-03 | Disposition: A | Discharge status: Home / Self Care | Payer: PPO | Source: Ambulatory Visit | Attending: Cardiovascular Disease | Admitting: Cardiovascular Disease

## 2015-05-03 DIAGNOSIS — I213 ST elevation (STEMI) myocardial infarction of unspecified site: Secondary | ICD-10-CM | POA: Diagnosis not present

## 2015-05-06 ENCOUNTER — Encounter (HOSPITAL_COMMUNITY)
Admission: RE | Admit: 2015-05-06 | Discharge: 2015-05-06 | Disposition: A | Discharge status: Home / Self Care | Payer: PPO | Source: Ambulatory Visit | Attending: Cardiovascular Disease | Admitting: Cardiovascular Disease

## 2015-05-06 DIAGNOSIS — I213 ST elevation (STEMI) myocardial infarction of unspecified site: Secondary | ICD-10-CM | POA: Diagnosis not present

## 2015-05-08 ENCOUNTER — Encounter (HOSPITAL_COMMUNITY)
Admission: RE | Admit: 2015-05-08 | Discharge: 2015-05-08 | Disposition: A | Discharge status: Home / Self Care | Payer: PPO | Source: Ambulatory Visit | Attending: Cardiovascular Disease | Admitting: Cardiovascular Disease

## 2015-05-08 ENCOUNTER — Encounter: Payer: Self-pay | Admitting: Adult Health

## 2015-05-08 ENCOUNTER — Ambulatory Visit (INDEPENDENT_AMBULATORY_CARE_PROVIDER_SITE_OTHER): Payer: PPO | Admitting: Adult Health

## 2015-05-08 VITALS — BP 160/60 | HR 66 | Ht 63.0 in | Wt 154.0 lb

## 2015-05-08 DIAGNOSIS — N390 Urinary tract infection, site not specified: Secondary | ICD-10-CM

## 2015-05-08 DIAGNOSIS — R309 Painful micturition, unspecified: Secondary | ICD-10-CM

## 2015-05-08 DIAGNOSIS — I213 ST elevation (STEMI) myocardial infarction of unspecified site: Secondary | ICD-10-CM | POA: Diagnosis not present

## 2015-05-08 HISTORY — DX: Painful micturition, unspecified: R30.9

## 2015-05-08 LAB — POCT URINALYSIS DIPSTICK
GLUCOSE UA: NEGATIVE
NITRITE UA: POSITIVE
RBC UA: NEGATIVE

## 2015-05-08 MED ORDER — SULFAMETHOXAZOLE-TRIMETHOPRIM 800-160 MG PO TABS: 1.0000 | ORAL_TABLET | Freq: Two times a day (BID) | ORAL | Status: DC

## 2015-05-08 NOTE — Progress Notes (Signed)
Subjective:     Patient ID: Virginia Huber, female   DOB: [birth year], 69 y.o.   MRN: [id number]  HPI Virginia Huber is a 69 year old white female,married in complaining of pain with urination and urine has odor.She had MI in October and had stent put in.  Review of Systems Pain with urination and odor,all other systems negative Reviewed past medical,surgical, social and family history. Reviewed medications and allergies.     Objective:   Physical Exam BP 160/60 mmHg  Pulse 66  Ht 5\' 3"  (1.6 m)  Wt 154 lb (69.854 kg)  BMI 27.29 kg/m2urine 1+ leuks,1+ protein and + nitrates, Skin warm and dry.Abdomen soft and non tender,Pelvic: external genitalia is normal in appearance no lesions, vagina: atrophic,urethra has no lesions or masses noted, cervix and uterus are absent, adnexa: no masses or tenderness noted. Bladder is mildly tender and no masses felt. No CVAT    Assessment:     Pain with urination UTI    Plan:    Push fluids UA C&S sent Rx septra ds 1 bid x 7 days #14   Follow up prn Review handout on UTI

## 2015-05-08 NOTE — Patient Instructions (Signed)
Push fluids Take septra ds Urinary Tract Infection Urinary tract infections (UTIs) can develop anywhere along your urinary tract. Your urinary tract is your body's drainage system for removing wastes and extra water. Your urinary tract includes two kidneys, two ureters, a bladder, and a urethra. Your kidneys are a pair of bean-shaped organs. Each kidney is about the size of your fist. They are located below your ribs, one on each side of your spine. CAUSES Infections are caused by microbes, which are microscopic organisms, including fungi, viruses, and bacteria. These organisms are so small that they can only be seen through a microscope. Bacteria are the microbes that most commonly cause UTIs. SYMPTOMS  Symptoms of UTIs may vary by age and gender of the patient and by the location of the infection. Symptoms in young women typically include a frequent and intense urge to urinate and a painful, burning feeling in the bladder or urethra during urination. Older women and men are more likely to be tired, shaky, and weak and have muscle aches and abdominal pain. A fever may mean the infection is in your kidneys. Other symptoms of a kidney infection include pain in your back or sides below the ribs, nausea, and vomiting. DIAGNOSIS To diagnose a UTI, your caregiver will ask you about your symptoms. Your caregiver will also ask you to provide a urine sample. The urine sample will be tested for bacteria and white blood cells. White blood cells are made by your body to help fight infection. TREATMENT  Typically, UTIs can be treated with medication. Because most UTIs are caused by a bacterial infection, they usually can be treated with the use of antibiotics. The choice of antibiotic and length of treatment depend on your symptoms and the type of bacteria causing your infection. HOME CARE INSTRUCTIONS  If you were prescribed antibiotics, take them exactly as your caregiver instructs you. Finish the medication  even if you feel better after you have only taken some of the medication.  Drink enough water and fluids to keep your urine clear or pale yellow.  Avoid caffeine, tea, and carbonated beverages. They tend to irritate your bladder.  Empty your bladder often. Avoid holding urine for long periods of time.  Empty your bladder before and after sexual intercourse.  After a bowel movement, women should cleanse from front to back. Use each tissue only once. SEEK MEDICAL CARE IF:   You have back pain.  You develop a fever.  Your symptoms do not begin to resolve within 3 days. SEEK IMMEDIATE MEDICAL CARE IF:   You have severe back pain or lower abdominal pain.  You develop chills.  You have nausea or vomiting.  You have continued burning or discomfort with urination. MAKE SURE YOU:   Understand these instructions.  Will watch your condition.  Will get help right away if you are not doing well or get worse.   This information is not intended to replace advice given to you by your health care provider. Make sure you discuss any questions you have with your health care provider.   Document Released: 03/18/2005 Document Revised: 02/27/2015 Document Reviewed: 07/17/2011 Elsevier Interactive Patient Education [employer].

## 2015-05-09 LAB — URINALYSIS, ROUTINE W REFLEX MICROSCOPIC
BILIRUBIN UA: NEGATIVE
GLUCOSE, UA: NEGATIVE
KETONES UA: NEGATIVE
Nitrite, UA: NEGATIVE
PH UA: 6 (ref 5.0–7.5)
RBC UA: NEGATIVE
SPEC GRAV UA: 1.017 (ref 1.005–1.030)
Urobilinogen, Ur: 0.2 mg/dL (ref 0.2–1.0)

## 2015-05-09 LAB — MICROSCOPIC EXAMINATION

## 2015-05-09 NOTE — Progress Notes (Signed)
Patient was given individual home exercise plan. Handout was given, reviewed, and discussed. Patient verbalized an understanding.

## 2015-05-10 ENCOUNTER — Encounter (HOSPITAL_COMMUNITY)
Admission: RE | Admit: 2015-05-10 | Discharge: 2015-05-10 | Disposition: A | Discharge status: Home / Self Care | Payer: PPO | Source: Ambulatory Visit | Attending: Cardiovascular Disease | Admitting: Cardiovascular Disease

## 2015-05-10 DIAGNOSIS — I213 ST elevation (STEMI) myocardial infarction of unspecified site: Secondary | ICD-10-CM | POA: Diagnosis not present

## 2015-05-10 LAB — URINE CULTURE

## 2015-05-13 ENCOUNTER — Encounter (HOSPITAL_COMMUNITY)
Admission: RE | Admit: 2015-05-13 | Discharge: 2015-05-13 | Disposition: A | Discharge status: Home / Self Care | Payer: PPO | Source: Ambulatory Visit | Attending: Cardiovascular Disease | Admitting: Cardiovascular Disease

## 2015-05-13 DIAGNOSIS — I213 ST elevation (STEMI) myocardial infarction of unspecified site: Secondary | ICD-10-CM | POA: Diagnosis not present

## 2015-05-15 ENCOUNTER — Encounter (HOSPITAL_COMMUNITY)
Admission: RE | Admit: 2015-05-15 | Discharge: 2015-05-15 | Disposition: A | Discharge status: Home / Self Care | Payer: PPO | Source: Ambulatory Visit | Attending: Cardiovascular Disease | Admitting: Cardiovascular Disease

## 2015-05-15 DIAGNOSIS — I213 ST elevation (STEMI) myocardial infarction of unspecified site: Secondary | ICD-10-CM | POA: Diagnosis not present

## 2015-05-17 ENCOUNTER — Encounter (HOSPITAL_COMMUNITY): Payer: PPO

## 2015-05-20 ENCOUNTER — Encounter (HOSPITAL_COMMUNITY)
Admission: RE | Admit: 2015-05-20 | Discharge: 2015-05-20 | Disposition: A | Discharge status: Home / Self Care | Payer: PPO | Source: Ambulatory Visit | Attending: Cardiovascular Disease | Admitting: Cardiovascular Disease

## 2015-05-20 DIAGNOSIS — I213 ST elevation (STEMI) myocardial infarction of unspecified site: Secondary | ICD-10-CM | POA: Diagnosis not present

## 2015-05-21 ENCOUNTER — Ambulatory Visit (INDEPENDENT_AMBULATORY_CARE_PROVIDER_SITE_OTHER): Payer: PPO | Admitting: Cardiovascular Disease

## 2015-05-21 ENCOUNTER — Encounter: Payer: Self-pay | Admitting: Cardiovascular Disease

## 2015-05-21 VITALS — BP 140/68 | HR 59 | Ht 63.0 in | Wt 150.0 lb

## 2015-05-21 DIAGNOSIS — I214 Non-ST elevation (NSTEMI) myocardial infarction: Secondary | ICD-10-CM | POA: Diagnosis not present

## 2015-05-21 DIAGNOSIS — Z955 Presence of coronary angioplasty implant and graft: Secondary | ICD-10-CM

## 2015-05-21 DIAGNOSIS — R5383 Other fatigue: Secondary | ICD-10-CM | POA: Diagnosis not present

## 2015-05-21 DIAGNOSIS — I739 Peripheral vascular disease, unspecified: Secondary | ICD-10-CM

## 2015-05-21 DIAGNOSIS — Z87898 Personal history of other specified conditions: Secondary | ICD-10-CM

## 2015-05-21 DIAGNOSIS — I25118 Atherosclerotic heart disease of native coronary artery with other forms of angina pectoris: Secondary | ICD-10-CM | POA: Diagnosis not present

## 2015-05-21 DIAGNOSIS — Z9289 Personal history of other medical treatment: Secondary | ICD-10-CM

## 2015-05-21 DIAGNOSIS — E785 Hyperlipidemia, unspecified: Secondary | ICD-10-CM

## 2015-05-21 DIAGNOSIS — I10 Essential (primary) hypertension: Secondary | ICD-10-CM

## 2015-05-21 NOTE — Patient Instructions (Signed)
Medication Instructions:  Your physician recommends that you continue on your current medications as directed. Please refer to the Current Medication list given to you today.   Labwork: NONE  Testing/Procedures: Your physician has requested that you have an echocardiogram. Echocardiography is a painless test that uses sound waves to create images of your heart. It provides your doctor with information about the size and shape of your heart and how well your heart's chambers and valves are working. This procedure takes approximately one hour. There are no restrictions for this procedure.    Follow-Up: Your physician recommends that you schedule a follow-up appointment in: 2-3 months with Dr. Bronson Ing   Any Other Special Instructions Will Be Listed Below (If Applicable).     If you need a refill on your cardiac medications before your next appointment, please call your pharmacy.  Your physician recommends that you continue on your current medications as directed. Please refer to the Current Medication list given to you today. Thanks for choosing [facility]!!!

## 2015-05-21 NOTE — Progress Notes (Signed)
Patient ID: Virginia Huber, female   DOB: May 07, 1946, 69 y.o.   MRN: [id number]      SUBJECTIVE: The patient presents for post-hospitalization follow-up. She underwent intervention on a 90% proximal to mid circumflex lesion. She was found to have interval occlusion of a previous OM1 PTCA site (small vessel) on 04/03/15.  Lipid panel on 02/07/15 showed total cholesterol 113, trig 382, LDL 10, HDL 27.   Echocardiogram on 12/14/14 demonstrated normal left ventricular systolic function and regional wall motion, LVEF 55-60%.  Denies chest pain, leg swelling, and shortness of breath. Does feel like she "gives out" prematurely with household activities.  Also has claudication pain in both legs.   Review of Systems: As per "subjective", otherwise negative.  Allergies: PCN: hives  Percocet: nausea, vomiting  Current Outpatient Prescriptions  Medication Sig Dispense Refill  . amLODipine (NORVASC) 10 MG tablet TAKE ONE (1) TABLET EACH DAY 90 tablet 3  . aspirin EC 81 MG tablet Take 1 tablet (81 mg total) by mouth daily.    [name] atorvastatin (LIPITOR) 10 MG tablet Take 1 tablet (10 mg total) by mouth daily at 6 PM. 90 tablet 3  . cholecalciferol (VITAMIN D) 1000 UNITS tablet Take 1,000 Units by mouth at bedtime.     . clopidogrel (PLAVIX) 75 MG tablet Take 1 tablet (75 mg total) by mouth daily. 30 tablet 6  . furosemide (LASIX) 20 MG tablet Take 1 tablet (20 mg total) by mouth as needed for edema (leg swelling). 30 tablet 3  . insulin NPH Human (HUMULIN N,NOVOLIN N) 100 UNIT/ML injection Inject 45 Units into the skin 2 (two) times daily before a meal.     . labetalol (NORMODYNE) 300 MG tablet Take 1 tablet (300 mg total) by mouth 2 (two) times daily. 180 tablet 2  . levothyroxine (SYNTHROID, LEVOTHROID) 137 MCG tablet Take 137 mcg by mouth daily before breakfast.    . losartan (COZAAR) 100 MG tablet TAKE ONE (1) TABLET BY MOUTH EVERY DAY 90 tablet 3  . metFORMIN (GLUCOPHAGE) 500 MG tablet Take 500  mg by mouth 3 (three) times daily with meals.     . nitroGLYCERIN (NITROSTAT) 0.4 MG SL tablet Place 1 tablet (0.4 mg total) under the tongue every 5 (five) minutes as needed for chest pain. 25 tablet 12  . Omega-3 Fatty Acids (FISH OIL) 1200 MG CAPS Take 1 capsule (1,200 mg total) by mouth daily.    . potassium chloride (K-DUR) 10 MEQ tablet Take 1 tablet (10 mEq total) by mouth as needed (on days that you take your Lasix). 30 tablet 3  . rOPINIRole (REQUIP) 0.5 MG tablet Take 0.5 mg by mouth at bedtime.   0   No current facility-administered medications for this visit.    Past Medical History  Diagnosis Date  . HTN (hypertension)   . Heart block   . Hypothyroidism   . Ovarian tumor   . Coronary artery disease 11/30/2014    PCI to the OM1  . UTI (urinary tract infection) 05/08/2013  . Superficial fungus infection of skin 06/29/2013  . TIA (transient ischemic attack) 08/2001; ~ 2006  . GERD (gastroesophageal reflux disease)   . Arthritis   . Hypercholesteremia   . Anginal pain ([place])   . Myocardial infarction ([place]) 02/2012  . NSTEMI (non-ST elevated myocardial infarction) ([place]) 04/02/2015    3.0x18 mm Xience DES to the CFX  . Pneumonia 02/2012  . Type II diabetes mellitus ([place])   . Cutaneous lupus erythematosus   .  History of blood transfusion 1980's    2nd surgical procedures  . Progressive angina ([place]) 11/2014    PTCA w/ scoring balloon to the OM1  . Pain with urination 05/08/2015    Past Surgical History  Procedure Laterality Date  . 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 Partial hysterectomy  1970's    left ovaries, then ovaries removed later due tumors   . Left oophorectomy  05/1979    nicked bowel, peritonitis, colostomy; colostomy reversed 1981   . Nuclear med stress test  10/2011    Small area of mild ischemia inferoapically.  . Abdominal aortagram N/A 01/03/2014    Procedure: ABDOMINAL [name];  Surgeon: Wellington Hampshire, MD;  Location: [facility] CATH LAB;  Service: Cardiovascular;   Laterality: N/A;  . Lower extremity angiogram N/A 01/03/2014    Procedure: LOWER EXTREMITY ANGIOGRAM;  Surgeon: Wellington Hampshire, MD;  Location: [facility] CATH LAB;  Service: Cardiovascular;  Laterality: N/A;  . Colostomy reversal  11/1979  . Colonoscopy  2005    Dr. Laural Golden: pancolonic divericula, polyp, path unknown currently  . Appendectomy  1970's  . Cholecystectomy open  1990's  . Excisional hemorrhoidectomy  1970's  . Abdominal hysterectomy  1972    "partial"  . 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 Eye surgery Left ~ 2001    "smoothed out wrinkle"  . Cardiac catheterization  2008    Tiny OM-2 with 90% narrowing. Med tx.  . Cardiac catheterization N/A 11/30/2014    Procedure: Left Heart Cath and Coronary Angiography;  Surgeon: Troy Sine, MD; LAD 20%, CFX 50%, OM1 95%, right PLB 30%, LV normal   . Cardiac catheterization N/A 11/30/2014    Procedure: Coronary Balloon Angioplasty;  Surgeon: Troy Sine, MD;  Angiosculpt scoring balloon and PTCA to the OM1 reducing stenosis from 95% to less than 10%  . Right oophorectomy  1970's  . Cardiac catheterization N/A 04/03/2015    Procedure: Left Heart Cath and Coronary Angiography;  Surgeon: Jolaine Artist, MD; dLAD 50%, CFX 90%, OM1 100%, PLA 15%, LVEDP 13    . Cardiac catheterization N/A 04/03/2015    Procedure: Coronary Stent Intervention;  Surgeon: Sherren Mocha, MD; 3.0x18 mm Xience DES to the CFX      Social History   Social History  . Marital Status: Married    Spouse Name: N/A  . Number of Children: N/A  . Years of Education: N/A   Occupational History  . Retired     Research officer, political party   Social History Main Topics  . Smoking status: Never Smoker   . Smokeless tobacco: Never Used  . Alcohol Use: No  . Drug Use: No  . Sexual Activity: No     Comment: hyst   Other Topics Concern  . Not on file   Social History Narrative     Filed Vitals:   05/21/15 1006  BP: 140/68  Pulse: 59  Height: 5\' 3"   (1.6 m)  Weight: 150 lb (68.04 kg)  SpO2: 98%    PHYSICAL EXAM General: NAD HEENT: Normal. Neck: No JVD, no thyromegaly. Lungs: Clear to auscultation bilaterally with normal respiratory effort. CV: Nondisplaced PMI. Regular rate and rhythm, normal S1/S2, no [id number], 1/6 systolic murmur at lower left sternal border. No pretibial or periankle edema.  Abdomen: Soft, nontender, no distention.  Neurologic: Alert and oriented x 3.  Psych: Normal affect. Skin: Normal. Musculoskeletal: Normal range of motion, no gross deformities. Extremities: No clubbing or cyanosis.  ECG: Most recent ECG reviewed.      ASSESSMENT AND PLAN: 1. CAD s/p PCI of LCx: Symptomatically stable. Continue ASA, Lipitor, Plavix, losartan, and labetalol. Participating in cardiac rehab.  2. Essential HTN: Borderline controlled on present therapy. Continue labetalol, losartan, and amlodipine.  3. Hyperlipidemia: Continue Lipitor 20 mg daily and fish oil for elevated triglycerides.  4. Daytime somnolence and fatigue: She was scheduled by me to have a sleep study but did not have it last year.  5. PVD: Has claudication pain. Encouraged to make appt sooner with vascular surgery.  6. Leg swelling: Continue 20 mg Lasix and 10 meq KCl prn.  Dispo: f/u 2-3 months.  Kate Sable, M.D., F.A.C.C.

## 2015-05-22 ENCOUNTER — Encounter (HOSPITAL_COMMUNITY)
Admission: RE | Admit: 2015-05-22 | Discharge: 2015-05-22 | Disposition: A | Discharge status: Home / Self Care | Payer: PPO | Source: Ambulatory Visit | Attending: Cardiovascular Disease | Admitting: Cardiovascular Disease

## 2015-05-22 DIAGNOSIS — I213 ST elevation (STEMI) myocardial infarction of unspecified site: Secondary | ICD-10-CM | POA: Diagnosis not present

## 2015-05-24 ENCOUNTER — Ambulatory Visit (HOSPITAL_COMMUNITY)
Admission: RE | Admit: 2015-05-24 | Discharge: 2015-05-24 | Disposition: A | Discharge status: Home / Self Care | Payer: PPO | Source: Ambulatory Visit | Attending: Cardiovascular Disease | Admitting: Cardiovascular Disease

## 2015-05-24 ENCOUNTER — Other Ambulatory Visit: Payer: Self-pay | Admitting: Obstetrics and Gynecology

## 2015-05-24 ENCOUNTER — Encounter (HOSPITAL_COMMUNITY)
Admission: RE | Admit: 2015-05-24 | Discharge: 2015-05-24 | Disposition: A | Discharge status: Home / Self Care | Payer: PPO | Source: Ambulatory Visit | Attending: Cardiovascular Disease | Admitting: Cardiovascular Disease

## 2015-05-24 DIAGNOSIS — I10 Essential (primary) hypertension: Secondary | ICD-10-CM | POA: Diagnosis not present

## 2015-05-24 DIAGNOSIS — R5383 Other fatigue: Secondary | ICD-10-CM

## 2015-05-24 DIAGNOSIS — I4891 Unspecified atrial fibrillation: Secondary | ICD-10-CM | POA: Insufficient documentation

## 2015-05-24 DIAGNOSIS — I340 Nonrheumatic mitral (valve) insufficiency: Secondary | ICD-10-CM | POA: Diagnosis not present

## 2015-05-24 DIAGNOSIS — I213 ST elevation (STEMI) myocardial infarction of unspecified site: Secondary | ICD-10-CM | POA: Insufficient documentation

## 2015-05-24 DIAGNOSIS — Z9861 Coronary angioplasty status: Secondary | ICD-10-CM | POA: Insufficient documentation

## 2015-05-24 DIAGNOSIS — I25118 Atherosclerotic heart disease of native coronary artery with other forms of angina pectoris: Secondary | ICD-10-CM | POA: Diagnosis not present

## 2015-05-24 DIAGNOSIS — I2510 Atherosclerotic heart disease of native coronary artery without angina pectoris: Secondary | ICD-10-CM | POA: Insufficient documentation

## 2015-05-24 DIAGNOSIS — Z1231 Encounter for screening mammogram for malignant neoplasm of breast: Secondary | ICD-10-CM

## 2015-05-27 ENCOUNTER — Encounter (HOSPITAL_COMMUNITY): Payer: PPO

## 2015-05-29 ENCOUNTER — Encounter (HOSPITAL_COMMUNITY)
Admission: RE | Admit: 2015-05-29 | Discharge: 2015-05-29 | Disposition: A | Discharge status: Home / Self Care | Payer: PPO | Source: Ambulatory Visit | Attending: Cardiovascular Disease | Admitting: Cardiovascular Disease

## 2015-05-29 DIAGNOSIS — I2510 Atherosclerotic heart disease of native coronary artery without angina pectoris: Secondary | ICD-10-CM | POA: Diagnosis not present

## 2015-05-29 DIAGNOSIS — I213 ST elevation (STEMI) myocardial infarction of unspecified site: Secondary | ICD-10-CM | POA: Diagnosis not present

## 2015-05-29 DIAGNOSIS — Z9861 Coronary angioplasty status: Secondary | ICD-10-CM | POA: Diagnosis not present

## 2015-05-31 ENCOUNTER — Encounter (HOSPITAL_COMMUNITY)
Admission: RE | Admit: 2015-05-31 | Discharge: 2015-05-31 | Disposition: A | Discharge status: Home / Self Care | Payer: PPO | Source: Ambulatory Visit | Attending: Cardiovascular Disease | Admitting: Cardiovascular Disease

## 2015-05-31 DIAGNOSIS — I213 ST elevation (STEMI) myocardial infarction of unspecified site: Secondary | ICD-10-CM | POA: Diagnosis not present

## 2015-06-03 ENCOUNTER — Encounter (HOSPITAL_COMMUNITY)
Admission: RE | Admit: 2015-06-03 | Discharge: 2015-06-03 | Disposition: A | Discharge status: Home / Self Care | Payer: PPO | Source: Ambulatory Visit | Attending: Cardiovascular Disease | Admitting: Cardiovascular Disease

## 2015-06-03 DIAGNOSIS — I213 ST elevation (STEMI) myocardial infarction of unspecified site: Secondary | ICD-10-CM | POA: Diagnosis not present

## 2015-06-05 ENCOUNTER — Encounter (HOSPITAL_COMMUNITY)
Admission: RE | Admit: 2015-06-05 | Discharge: 2015-06-05 | Disposition: A | Discharge status: Home / Self Care | Payer: PPO | Source: Ambulatory Visit | Attending: Cardiovascular Disease | Admitting: Cardiovascular Disease

## 2015-06-05 ENCOUNTER — Ambulatory Visit (HOSPITAL_COMMUNITY)
Admission: RE | Admit: 2015-06-05 | Discharge: 2015-06-05 | Disposition: A | Discharge status: Home / Self Care | Payer: PPO | Source: Ambulatory Visit | Attending: Obstetrics and Gynecology | Admitting: Obstetrics and Gynecology

## 2015-06-05 DIAGNOSIS — Z1231 Encounter for screening mammogram for malignant neoplasm of breast: Secondary | ICD-10-CM | POA: Insufficient documentation

## 2015-06-05 DIAGNOSIS — I213 ST elevation (STEMI) myocardial infarction of unspecified site: Secondary | ICD-10-CM | POA: Diagnosis not present

## 2015-06-05 NOTE — Progress Notes (Signed)
Cardiac Rehabilitation Program Outcomes Report   Orientation:  04/23/15 Graduate Date:  tbd Discharge Date:  tbd # of sessions completed: 18  Cardiologist: Gaynelle Cage MD:  Bethann Berkshire Time:  0930  A.  Exercise Program:  Tolerates exercise @ 3.46 METS for 15 minutes  B.  Mental Health:  Good mental attitude  C.  Education/Instruction/Skills  Accurately checks own pulse.  Rest:  77  Exercise:  80 and Knows THR for exercise  Uses Perceived Exertion Scale and/or Dyspnea Scale  D.  Nutrition/Weight Control/Body Composition:  Adherence to prescribed nutrition program: good    E.  Blood Lipids    Lab Results  Component Value Date   CHOL 133 04/03/2015   HDL 23* 04/03/2015   LDLCALC UNABLE TO CALCULATE IF TRIGLYCERIDE OVER 400 mg/dL 04/03/2015   TRIG 617* 04/03/2015   CHOLHDL 5.8 04/03/2015    F.  Lifestyle Changes:  Making positive lifestyle changes  G.  Symptoms noted with exercise:  Asymptomatic  Report Completed By:  Stevphen Rochester RN   Comments:  This is the patients halfway progress note for [facility].

## 2015-06-07 ENCOUNTER — Encounter (HOSPITAL_COMMUNITY)
Admission: RE | Admit: 2015-06-07 | Discharge: 2015-06-07 | Disposition: A | Discharge status: Home / Self Care | Payer: PPO | Source: Ambulatory Visit | Attending: Cardiovascular Disease | Admitting: Cardiovascular Disease

## 2015-06-07 DIAGNOSIS — I213 ST elevation (STEMI) myocardial infarction of unspecified site: Secondary | ICD-10-CM | POA: Diagnosis not present

## 2015-06-10 ENCOUNTER — Encounter (HOSPITAL_COMMUNITY)
Admission: RE | Admit: 2015-06-10 | Discharge: 2015-06-10 | Disposition: A | Discharge status: Home / Self Care | Payer: PPO | Source: Ambulatory Visit | Attending: Cardiovascular Disease | Admitting: Cardiovascular Disease

## 2015-06-10 ENCOUNTER — Other Ambulatory Visit: Payer: Self-pay | Admitting: Cardiovascular Disease

## 2015-06-10 DIAGNOSIS — I213 ST elevation (STEMI) myocardial infarction of unspecified site: Secondary | ICD-10-CM | POA: Diagnosis not present

## 2015-06-12 ENCOUNTER — Encounter (HOSPITAL_COMMUNITY)
Admission: RE | Admit: 2015-06-12 | Discharge: 2015-06-12 | Disposition: A | Discharge status: Home / Self Care | Payer: PPO | Source: Ambulatory Visit | Attending: Cardiovascular Disease | Admitting: Cardiovascular Disease

## 2015-06-12 DIAGNOSIS — I213 ST elevation (STEMI) myocardial infarction of unspecified site: Secondary | ICD-10-CM | POA: Diagnosis not present

## 2015-06-14 ENCOUNTER — Encounter (HOSPITAL_COMMUNITY)
Admission: RE | Admit: 2015-06-14 | Discharge: 2015-06-14 | Disposition: A | Discharge status: Home / Self Care | Payer: PPO | Source: Ambulatory Visit | Attending: Cardiovascular Disease | Admitting: Cardiovascular Disease

## 2015-06-14 DIAGNOSIS — I213 ST elevation (STEMI) myocardial infarction of unspecified site: Secondary | ICD-10-CM | POA: Diagnosis not present

## 2015-06-17 ENCOUNTER — Encounter (HOSPITAL_COMMUNITY): Payer: PPO

## 2015-06-19 ENCOUNTER — Encounter (HOSPITAL_COMMUNITY): Payer: PPO

## 2015-06-21 ENCOUNTER — Encounter (HOSPITAL_COMMUNITY)
Admission: RE | Admit: 2015-06-21 | Discharge: 2015-06-21 | Disposition: A | Discharge status: Home / Self Care | Payer: PPO | Source: Ambulatory Visit | Attending: Cardiovascular Disease | Admitting: Cardiovascular Disease

## 2015-06-21 DIAGNOSIS — I213 ST elevation (STEMI) myocardial infarction of unspecified site: Secondary | ICD-10-CM | POA: Diagnosis not present

## 2015-06-24 ENCOUNTER — Encounter (HOSPITAL_COMMUNITY): Payer: PPO

## 2015-06-26 ENCOUNTER — Encounter (HOSPITAL_COMMUNITY)
Admission: RE | Admit: 2015-06-26 | Discharge: 2015-06-26 | Disposition: A | Discharge status: Home / Self Care | Payer: PPO | Source: Ambulatory Visit | Attending: Cardiovascular Disease | Admitting: Cardiovascular Disease

## 2015-06-26 DIAGNOSIS — I213 ST elevation (STEMI) myocardial infarction of unspecified site: Secondary | ICD-10-CM | POA: Insufficient documentation

## 2015-06-26 DIAGNOSIS — I2510 Atherosclerotic heart disease of native coronary artery without angina pectoris: Secondary | ICD-10-CM | POA: Diagnosis not present

## 2015-06-26 DIAGNOSIS — Z9861 Coronary angioplasty status: Secondary | ICD-10-CM | POA: Insufficient documentation

## 2015-06-28 ENCOUNTER — Encounter (HOSPITAL_COMMUNITY)
Admission: RE | Admit: 2015-06-28 | Discharge: 2015-06-28 | Disposition: A | Discharge status: Home / Self Care | Payer: PPO | Source: Ambulatory Visit | Attending: Cardiovascular Disease | Admitting: Cardiovascular Disease

## 2015-06-28 DIAGNOSIS — H2511 Age-related nuclear cataract, right eye: Secondary | ICD-10-CM | POA: Diagnosis not present

## 2015-06-28 DIAGNOSIS — I213 ST elevation (STEMI) myocardial infarction of unspecified site: Secondary | ICD-10-CM | POA: Diagnosis not present

## 2015-07-01 ENCOUNTER — Encounter (HOSPITAL_COMMUNITY): Payer: PPO

## 2015-07-03 ENCOUNTER — Encounter (HOSPITAL_COMMUNITY)
Admission: RE | Admit: 2015-07-03 | Discharge: 2015-07-03 | Disposition: A | Discharge status: Home / Self Care | Payer: PPO | Source: Ambulatory Visit | Attending: Cardiovascular Disease | Admitting: Cardiovascular Disease

## 2015-07-03 ENCOUNTER — Ambulatory Visit (INDEPENDENT_AMBULATORY_CARE_PROVIDER_SITE_OTHER): Payer: PPO | Admitting: Adult Health

## 2015-07-03 ENCOUNTER — Encounter: Payer: Self-pay | Admitting: Adult Health

## 2015-07-03 VITALS — BP 144/60 | HR 66 | Ht 62.25 in | Wt 155.5 lb

## 2015-07-03 DIAGNOSIS — N390 Urinary tract infection, site not specified: Secondary | ICD-10-CM | POA: Diagnosis not present

## 2015-07-03 DIAGNOSIS — Z01419 Encounter for gynecological examination (general) (routine) without abnormal findings: Secondary | ICD-10-CM

## 2015-07-03 DIAGNOSIS — R319 Hematuria, unspecified: Secondary | ICD-10-CM | POA: Diagnosis not present

## 2015-07-03 DIAGNOSIS — Z1212 Encounter for screening for malignant neoplasm of rectum: Secondary | ICD-10-CM

## 2015-07-03 DIAGNOSIS — I213 ST elevation (STEMI) myocardial infarction of unspecified site: Secondary | ICD-10-CM | POA: Diagnosis not present

## 2015-07-03 LAB — POCT URINALYSIS DIPSTICK
Leukocytes, UA: NEGATIVE
Nitrite, UA: POSITIVE

## 2015-07-03 LAB — HEMOCCULT GUIAC POC 1CARD (OFFICE): Fecal Occult Blood, POC: NEGATIVE

## 2015-07-03 MED ORDER — SULFAMETHOXAZOLE-TRIMETHOPRIM 800-160 MG PO TABS: 1.0000 | ORAL_TABLET | Freq: Two times a day (BID) | ORAL | Status: DC

## 2015-07-03 NOTE — Patient Instructions (Signed)
Physical in 2 year Mammogram yearly Labs with PCP Push water

## 2015-07-03 NOTE — Progress Notes (Signed)
Patient ID: Virginia Huber, female   DOB: [birth year], 70 y.o.   MRN: [id number] History of Present Illness: Virginia Huber is a 70 year old white female, married in for a well woman gyn exam, she is sp hysterectomy.Had UTI in November was Klebsiella and was treated with septra ds.Has seen urologist in past.Wants urine checked.Is having cataract surgery 1/16 in [place]. Got flu shot in October, had 2 MIs last year. PCP is Dr [name].   Current Medications, Allergies, Past Medical History, Past Surgical History, Family History and Social History were reviewed in [employer] record.     Review of Systems: Patient denies any headaches, hearing loss, fatigue, blurred vision, shortness of breath, chest pain, abdominal pain, problems with bowel movements, or intercourse(not having sex). No joint pain or mood swings.    Physical Exam:BP 144/60 mmHg  Pulse 66  Ht 5' 2.25" (1.581 m)  Wt 155 lb 8 oz (70.534 kg)  BMI 28.22 kg/m2 Urine +nitrates,3+ protein, 1+blood. General:  Well developed, well nourished, no acute distress Skin:  Warm and dry Neck:  Midline trachea, normal thyroid, good ROM, no lymphadenopathy, no carotid bruits Lungs; Clear to auscultation bilaterally Breast:  No dominant palpable mass, retraction, or nipple discharge Cardiovascular: Regular rate and rhythm Abdomen:  Soft, non tender, no hepatosplenomegaly Pelvic:  External genitalia is normal in appearance, no lesions.  The vagina has decreased color, moisture and rugae. Urethra has no lesions or masses. The cervix and uterus are absent. No adnexal masses or tenderness noted.Bladder is non tender, no masses felt. Rectal: Good sphincter tone, no polyps, or hemorrhoids felt.  Hemoccult negative. Extremities/musculoskeletal:  No swelling or varicosities noted, no clubbing or cyanosis Psych:  No mood changes, alert and cooperative,seems happy   Impression: Well woman gyn exam no pap UTI with hematuria      Plan: UA C&S sent Rx septra ds 1 bid x 7 days #14 Increase water Physical in 2 years Mammogram yearly Labs with PCP

## 2015-07-04 LAB — URINALYSIS, ROUTINE W REFLEX MICROSCOPIC
BILIRUBIN UA: NEGATIVE
GLUCOSE, UA: NEGATIVE
KETONES UA: NEGATIVE
NITRITE UA: NEGATIVE
RBC UA: NEGATIVE
SPEC GRAV UA: 1.019 (ref 1.005–1.030)
UUROB: 0.2 mg/dL (ref 0.2–1.0)
pH, UA: 5.5 (ref 5.0–7.5)

## 2015-07-04 LAB — MICROSCOPIC EXAMINATION: Casts: NONE SEEN /lpf

## 2015-07-05 ENCOUNTER — Telehealth: Payer: Self-pay | Admitting: Adult Health

## 2015-07-05 ENCOUNTER — Encounter (HOSPITAL_COMMUNITY)
Admission: RE | Admit: 2015-07-05 | Discharge: 2015-07-05 | Disposition: A | Discharge status: Home / Self Care | Payer: PPO | Source: Ambulatory Visit | Attending: Cardiovascular Disease | Admitting: Cardiovascular Disease

## 2015-07-05 DIAGNOSIS — I213 ST elevation (STEMI) myocardial infarction of unspecified site: Secondary | ICD-10-CM | POA: Diagnosis not present

## 2015-07-05 LAB — URINE CULTURE

## 2015-07-05 NOTE — Telephone Encounter (Signed)
Pt aware has UTI and that septa ds should help

## 2015-07-08 ENCOUNTER — Encounter (HOSPITAL_COMMUNITY): Payer: PPO

## 2015-07-08 DIAGNOSIS — H2511 Age-related nuclear cataract, right eye: Secondary | ICD-10-CM | POA: Diagnosis not present

## 2015-07-10 ENCOUNTER — Encounter (HOSPITAL_COMMUNITY)
Admission: RE | Admit: 2015-07-10 | Discharge: 2015-07-10 | Disposition: A | Discharge status: Home / Self Care | Payer: PPO | Source: Ambulatory Visit | Attending: Cardiovascular Disease | Admitting: Cardiovascular Disease

## 2015-07-10 DIAGNOSIS — I213 ST elevation (STEMI) myocardial infarction of unspecified site: Secondary | ICD-10-CM | POA: Diagnosis not present

## 2015-07-12 ENCOUNTER — Encounter (HOSPITAL_COMMUNITY)
Admission: RE | Admit: 2015-07-12 | Discharge: 2015-07-12 | Disposition: A | Discharge status: Home / Self Care | Payer: PPO | Source: Ambulatory Visit | Attending: Cardiovascular Disease | Admitting: Cardiovascular Disease

## 2015-07-12 DIAGNOSIS — I213 ST elevation (STEMI) myocardial infarction of unspecified site: Secondary | ICD-10-CM | POA: Diagnosis not present

## 2015-07-15 ENCOUNTER — Encounter (HOSPITAL_COMMUNITY)
Admission: RE | Admit: 2015-07-15 | Discharge: 2015-07-15 | Disposition: A | Discharge status: Home / Self Care | Payer: PPO | Source: Ambulatory Visit | Attending: Cardiovascular Disease | Admitting: Cardiovascular Disease

## 2015-07-15 DIAGNOSIS — I213 ST elevation (STEMI) myocardial infarction of unspecified site: Secondary | ICD-10-CM | POA: Diagnosis not present

## 2015-07-17 ENCOUNTER — Encounter (HOSPITAL_COMMUNITY)
Admission: RE | Admit: 2015-07-17 | Discharge: 2015-07-17 | Disposition: A | Discharge status: Home / Self Care | Payer: PPO | Source: Ambulatory Visit | Attending: Cardiovascular Disease | Admitting: Cardiovascular Disease

## 2015-07-17 DIAGNOSIS — I213 ST elevation (STEMI) myocardial infarction of unspecified site: Secondary | ICD-10-CM | POA: Diagnosis not present

## 2015-07-19 ENCOUNTER — Encounter (HOSPITAL_COMMUNITY)
Admission: RE | Admit: 2015-07-19 | Discharge: 2015-07-19 | Disposition: A | Discharge status: Home / Self Care | Payer: PPO | Source: Ambulatory Visit | Attending: Cardiovascular Disease | Admitting: Cardiovascular Disease

## 2015-07-19 DIAGNOSIS — I213 ST elevation (STEMI) myocardial infarction of unspecified site: Secondary | ICD-10-CM | POA: Diagnosis not present

## 2015-07-22 ENCOUNTER — Encounter (HOSPITAL_COMMUNITY)
Admission: RE | Admit: 2015-07-22 | Discharge: 2015-07-22 | Disposition: A | Discharge status: Home / Self Care | Payer: PPO | Source: Ambulatory Visit | Attending: Cardiovascular Disease | Admitting: Cardiovascular Disease

## 2015-07-22 DIAGNOSIS — I213 ST elevation (STEMI) myocardial infarction of unspecified site: Secondary | ICD-10-CM | POA: Diagnosis not present

## 2015-07-24 ENCOUNTER — Encounter (HOSPITAL_COMMUNITY)
Admission: RE | Admit: 2015-07-24 | Discharge: 2015-07-24 | Disposition: A | Discharge status: Home / Self Care | Payer: PPO | Source: Ambulatory Visit | Attending: Cardiovascular Disease | Admitting: Cardiovascular Disease

## 2015-07-24 DIAGNOSIS — I2510 Atherosclerotic heart disease of native coronary artery without angina pectoris: Secondary | ICD-10-CM | POA: Insufficient documentation

## 2015-07-24 DIAGNOSIS — I213 ST elevation (STEMI) myocardial infarction of unspecified site: Secondary | ICD-10-CM | POA: Diagnosis not present

## 2015-07-24 DIAGNOSIS — Z9861 Coronary angioplasty status: Secondary | ICD-10-CM | POA: Diagnosis not present

## 2015-07-26 ENCOUNTER — Ambulatory Visit: Payer: PPO | Admitting: Cardiovascular Disease

## 2015-07-26 ENCOUNTER — Ambulatory Visit (INDEPENDENT_AMBULATORY_CARE_PROVIDER_SITE_OTHER): Payer: PPO | Admitting: Cardiovascular Disease

## 2015-07-26 ENCOUNTER — Encounter (HOSPITAL_COMMUNITY)
Admission: RE | Admit: 2015-07-26 | Discharge: 2015-07-26 | Disposition: A | Discharge status: Home / Self Care | Payer: PPO | Source: Ambulatory Visit | Attending: Cardiovascular Disease | Admitting: Cardiovascular Disease

## 2015-07-26 ENCOUNTER — Encounter: Payer: Self-pay | Admitting: Cardiovascular Disease

## 2015-07-26 VITALS — BP 118/66 | HR 63 | Ht 63.0 in | Wt 153.0 lb

## 2015-07-26 DIAGNOSIS — R5383 Other fatigue: Secondary | ICD-10-CM

## 2015-07-26 DIAGNOSIS — I739 Peripheral vascular disease, unspecified: Secondary | ICD-10-CM

## 2015-07-26 DIAGNOSIS — I213 ST elevation (STEMI) myocardial infarction of unspecified site: Secondary | ICD-10-CM | POA: Diagnosis not present

## 2015-07-26 DIAGNOSIS — I25118 Atherosclerotic heart disease of native coronary artery with other forms of angina pectoris: Secondary | ICD-10-CM | POA: Diagnosis not present

## 2015-07-26 DIAGNOSIS — M7989 Other specified soft tissue disorders: Secondary | ICD-10-CM | POA: Diagnosis not present

## 2015-07-26 DIAGNOSIS — I10 Essential (primary) hypertension: Secondary | ICD-10-CM

## 2015-07-26 DIAGNOSIS — E785 Hyperlipidemia, unspecified: Secondary | ICD-10-CM

## 2015-07-26 MED ORDER — AMLODIPINE BESYLATE 10 MG PO TABS: ORAL_TABLET | ORAL | Status: DC

## 2015-07-26 NOTE — Patient Instructions (Signed)

## 2015-07-26 NOTE — Progress Notes (Signed)
Patient ID: Virginia Huber, female   DOB: Mar 19, 1946, 70 y.o.   MRN: [id number]      SUBJECTIVE: the patient presents for routine follow-up of coronary artery disease , hypertension, and hyperlipidemia. She underwent intervention on a 90% proximal to mid circumflex lesion. She was found to have interval occlusion of a previous OM1 PTCA site (small vessel) on 04/03/15. Lipid panel on 02/07/15 showed total cholesterol 113, trig 382, LDL 10, HDL 27.   Echocardiogram on 05/24/15 demonstrated normal left ventricular systolic function and regional wall motion, LVEF 0000000, grade 2 diastolic dysfunction, and severe left atrial enlargement.[name]  Has had one episode of chest pain since her last visit requiring one SL nitro tablet. Denies shortness of breath and leg swelling.  Doesn't sleep much. Cancelled sleep study in 2015. Struggles with hip bursitis.  Review of Systems: As per "subjective", otherwise negative.  Allergies: PCN.  Current Outpatient Prescriptions  Medication Sig Dispense Refill  . amLODipine (NORVASC) 10 MG tablet TAKE ONE (1) TABLET EACH DAY 90 tablet 3  . aspirin EC 81 MG tablet Take 1 tablet (81 mg total) by mouth daily.    [name] atorvastatin (LIPITOR) 10 MG tablet Take 1 tablet (10 mg total) by mouth daily at 6 PM. 90 tablet 3  . cholecalciferol (VITAMIN D) 1000 UNITS tablet Take 1,000 Units by mouth at bedtime.     . clopidogrel (PLAVIX) 75 MG tablet Take 1 tablet (75 mg total) by mouth daily. 30 tablet 6  . furosemide (LASIX) 20 MG tablet Take 1 tablet (20 mg total) by mouth as needed for edema (leg swelling). 30 tablet 3  . insulin NPH Human (HUMULIN N,NOVOLIN N) 100 UNIT/ML injection Inject 45 Units into the skin 2 (two) times daily before a meal.     . labetalol (NORMODYNE) 300 MG tablet Take 1 tablet (300 mg total) by mouth 2 (two) times daily. 180 tablet 2  . levothyroxine (SYNTHROID, LEVOTHROID) 137 MCG tablet Take 137 mcg by mouth daily before breakfast.    . losartan  (COZAAR) 100 MG tablet TAKE ONE (1) TABLET BY MOUTH EVERY DAY 90 tablet 3  . metFORMIN (GLUCOPHAGE) 500 MG tablet Take 500 mg by mouth 3 (three) times daily with meals.     . nitroGLYCERIN (NITROSTAT) 0.4 MG SL tablet Place 1 tablet (0.4 mg total) under the tongue every 5 (five) minutes as needed for chest pain. 25 tablet 12  . Omega-3 Fatty Acids (FISH OIL) 1200 MG CAPS Take 1 capsule (1,200 mg total) by mouth daily.    . potassium chloride (K-DUR) 10 MEQ tablet Take 1 tablet (10 mEq total) by mouth as needed (on days that you take your Lasix). 30 tablet 3  . rOPINIRole (REQUIP) 0.5 MG tablet Take 0.5 mg by mouth at bedtime.   0  . sulfamethoxazole-trimethoprim (BACTRIM DS,SEPTRA DS) 800-160 MG tablet Take 1 tablet by mouth 2 (two) times daily. 14 tablet 0   No current facility-administered medications for this visit.    Past Medical History  Diagnosis Date  . HTN (hypertension)   . Heart block   . Hypothyroidism   . Ovarian tumor   . Coronary artery disease 11/30/2014    PCI to the OM1  . UTI (urinary tract infection) 05/08/2013  . Superficial fungus infection of skin 06/29/2013  . TIA (transient ischemic attack) 08/2001; ~ 2006  . GERD (gastroesophageal reflux disease)   . Arthritis   . Hypercholesteremia   . Anginal pain ([place])   . Myocardial  infarction ([place]) 02/2012  . NSTEMI (non-ST elevated myocardial infarction) ([place]) 04/02/2015    3.0x18 mm Xience DES to the CFX  . Pneumonia 02/2012  . Type II diabetes mellitus ([place])   . Cutaneous lupus erythematosus   . History of blood transfusion 1980's    2nd surgical procedures  . Progressive angina ([place]) 11/2014    PTCA w/ scoring balloon to the OM1  . Pain with urination 05/08/2015    Past Surgical History  Procedure Laterality Date  . 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 Partial hysterectomy  1970's    left ovaries, then ovaries removed later due tumors   . Left oophorectomy  05/1979    nicked bowel, peritonitis, colostomy; colostomy reversed  1981   . Nuclear med stress test  10/2011    Small area of mild ischemia inferoapically.  . Abdominal aortagram N/A 01/03/2014    Procedure: ABDOMINAL [name];  Surgeon: Wellington Hampshire, MD;  Location: [facility] CATH LAB;  Service: Cardiovascular;  Laterality: N/A;  . Lower extremity angiogram N/A 01/03/2014    Procedure: LOWER EXTREMITY ANGIOGRAM;  Surgeon: Wellington Hampshire, MD;  Location: [facility] CATH LAB;  Service: Cardiovascular;  Laterality: N/A;  . Colostomy reversal  11/1979  . Colonoscopy  2005    Dr. Laural Golden: pancolonic divericula, polyp, path unknown currently  . Appendectomy  1970's  . Cholecystectomy open  1990's  . Excisional hemorrhoidectomy  1970's  . Abdominal hysterectomy  1972    "partial"  . 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 Eye surgery Left ~ 2001    "smoothed out wrinkle"  . Cardiac catheterization  2008    Tiny OM-2 with 90% narrowing. Med tx.  . Cardiac catheterization N/A 11/30/2014    Procedure: Left Heart Cath and Coronary Angiography;  Surgeon: Troy Sine, MD; LAD 20%, CFX 50%, OM1 95%, right PLB 30%, LV normal   . Cardiac catheterization N/A 11/30/2014    Procedure: Coronary Balloon Angioplasty;  Surgeon: Troy Sine, MD;  Angiosculpt scoring balloon and PTCA to the OM1 reducing stenosis from 95% to less than 10%  . Right oophorectomy  1970's  . Cardiac catheterization N/A 04/03/2015    Procedure: Left Heart Cath and Coronary Angiography;  Surgeon: Jolaine Artist, MD; dLAD 50%, CFX 90%, OM1 100%, PLA 15%, LVEDP 13    . Cardiac catheterization N/A 04/03/2015    Procedure: Coronary Stent Intervention;  Surgeon: Sherren Mocha, MD; 3.0x18 mm Xience DES to the CFX      Social History   Social History  . Marital Status: Married    Spouse Name: N/A  . Number of Children: N/A  . Years of Education: N/A   Occupational History  . Retired     Research officer, political party   Social History Main Topics  . Smoking status: Never Smoker   . Smokeless  tobacco: Never Used  . Alcohol Use: No  . Drug Use: No  . Sexual Activity: No     Comment: hyst   Other Topics Concern  . Not on file   Social History Narrative     Filed Vitals:   07/26/15 1035  BP: 118/66  Pulse: 63  Height: 5\' 3"  (1.6 m)  Weight: 153 lb (69.4 kg)  SpO2: 99%    PHYSICAL EXAM General: NAD HEENT: Normal. Neck: No JVD, no thyromegaly. Lungs: Clear to auscultation bilaterally with normal respiratory effort. CV: Nondisplaced PMI.  Regular rate and rhythm, normal S1/S2, no S3/S4, no murmur. No  pretibial or periankle edema.  No carotid bruit.   Abdomen: Soft, nontender, no distention.  Neurologic: Alert and oriented.  Psych: Normal affect. Skin: Normal. Musculoskeletal: No gross deformities.  ECG: Most recent ECG reviewed.      ASSESSMENT AND PLAN: 1. CAD s/p PCI of LCx: Symptomatically stable. Continue ASA, Lipitor, Plavix, losartan, and labetalol. Normal LVEF as noted above.  2. Essential HTN: Controlled on present therapy. Continue labetalol, losartan, and amlodipine.  3. Hyperlipidemia: Lipids reviewed above. Continue Lipitor 20 mg daily and fish oil for elevated triglycerides.  4. Daytime somnolence and fatigue: I again talked to her about a sleep study. She will again consider it.  5. PVD: Previously described claudication pain and I encouraged to make appt soon with vascular surgery.  6. Leg swelling: Continue 20 mg Lasix and 10 meq KCl prn.  Dispo: f/u 6 months.   Kate Sable, M.D., F.A.C.C.

## 2015-07-29 ENCOUNTER — Encounter (HOSPITAL_COMMUNITY)
Admission: RE | Admit: 2015-07-29 | Discharge: 2015-07-29 | Disposition: A | Discharge status: Home / Self Care | Payer: PPO | Source: Ambulatory Visit | Attending: Cardiovascular Disease | Admitting: Cardiovascular Disease

## 2015-07-29 DIAGNOSIS — I213 ST elevation (STEMI) myocardial infarction of unspecified site: Secondary | ICD-10-CM | POA: Diagnosis not present

## 2015-07-31 ENCOUNTER — Encounter (HOSPITAL_COMMUNITY): Payer: PPO

## 2015-08-02 ENCOUNTER — Emergency Department (HOSPITAL_COMMUNITY): Payer: PPO

## 2015-08-02 ENCOUNTER — Encounter (HOSPITAL_COMMUNITY): Payer: Self-pay

## 2015-08-02 ENCOUNTER — Encounter (HOSPITAL_COMMUNITY)
Admission: EM | Disposition: A | Discharge status: Home / Self Care | Payer: Self-pay | Source: Home / Self Care | Attending: Cardiovascular Disease

## 2015-08-02 ENCOUNTER — Inpatient Hospital Stay (HOSPITAL_COMMUNITY)
Admission: EM | Admit: 2015-08-02 | Discharge: 2015-08-03 | DRG: 247 | Disposition: A | Discharge status: Home / Self Care | Payer: PPO | Attending: Cardiovascular Disease | Admitting: Cardiovascular Disease

## 2015-08-02 DIAGNOSIS — I4891 Unspecified atrial fibrillation: Secondary | ICD-10-CM | POA: Diagnosis not present

## 2015-08-02 DIAGNOSIS — E039 Hypothyroidism, unspecified: Secondary | ICD-10-CM | POA: Diagnosis present

## 2015-08-02 DIAGNOSIS — E785 Hyperlipidemia, unspecified: Secondary | ICD-10-CM | POA: Diagnosis present

## 2015-08-02 DIAGNOSIS — E1159 Type 2 diabetes mellitus with other circulatory complications: Secondary | ICD-10-CM | POA: Diagnosis present

## 2015-08-02 DIAGNOSIS — I209 Angina pectoris, unspecified: Secondary | ICD-10-CM

## 2015-08-02 DIAGNOSIS — I480 Paroxysmal atrial fibrillation: Secondary | ICD-10-CM | POA: Diagnosis present

## 2015-08-02 DIAGNOSIS — I25110 Atherosclerotic heart disease of native coronary artery with unstable angina pectoris: Secondary | ICD-10-CM

## 2015-08-02 DIAGNOSIS — J9811 Atelectasis: Secondary | ICD-10-CM | POA: Diagnosis not present

## 2015-08-02 DIAGNOSIS — Z8673 Personal history of transient ischemic attack (TIA), and cerebral infarction without residual deficits: Secondary | ICD-10-CM | POA: Diagnosis not present

## 2015-08-02 DIAGNOSIS — I10 Essential (primary) hypertension: Secondary | ICD-10-CM | POA: Diagnosis present

## 2015-08-02 DIAGNOSIS — Z7982 Long term (current) use of aspirin: Secondary | ICD-10-CM | POA: Diagnosis not present

## 2015-08-02 DIAGNOSIS — E1122 Type 2 diabetes mellitus with diabetic chronic kidney disease: Secondary | ICD-10-CM

## 2015-08-02 DIAGNOSIS — Z955 Presence of coronary angioplasty implant and graft: Secondary | ICD-10-CM | POA: Diagnosis not present

## 2015-08-02 DIAGNOSIS — E1165 Type 2 diabetes mellitus with hyperglycemia: Secondary | ICD-10-CM | POA: Diagnosis not present

## 2015-08-02 DIAGNOSIS — Z7902 Long term (current) use of antithrombotics/antiplatelets: Secondary | ICD-10-CM

## 2015-08-02 DIAGNOSIS — Z794 Long term (current) use of insulin: Secondary | ICD-10-CM | POA: Diagnosis not present

## 2015-08-02 DIAGNOSIS — N182 Chronic kidney disease, stage 2 (mild): Secondary | ICD-10-CM

## 2015-08-02 DIAGNOSIS — R079 Chest pain, unspecified: Secondary | ICD-10-CM | POA: Diagnosis not present

## 2015-08-02 DIAGNOSIS — I2510 Atherosclerotic heart disease of native coronary artery without angina pectoris: Secondary | ICD-10-CM | POA: Diagnosis not present

## 2015-08-02 DIAGNOSIS — I214 Non-ST elevation (NSTEMI) myocardial infarction: Secondary | ICD-10-CM | POA: Diagnosis not present

## 2015-08-02 DIAGNOSIS — E1121 Type 2 diabetes mellitus with diabetic nephropathy: Secondary | ICD-10-CM | POA: Diagnosis present

## 2015-08-02 DIAGNOSIS — R072 Precordial pain: Secondary | ICD-10-CM | POA: Diagnosis not present

## 2015-08-02 DIAGNOSIS — I252 Old myocardial infarction: Secondary | ICD-10-CM

## 2015-08-02 DIAGNOSIS — I25118 Atherosclerotic heart disease of native coronary artery with other forms of angina pectoris: Secondary | ICD-10-CM | POA: Diagnosis not present

## 2015-08-02 HISTORY — PX: CARDIAC CATHETERIZATION: SHX172

## 2015-08-02 LAB — CBC
HEMATOCRIT: 34.4 % — AB (ref 36.0–46.0)
HEMATOCRIT: 33.2 % — AB (ref 36.0–46.0)
HEMOGLOBIN: 11.4 g/dL — AB (ref 12.0–15.0)
Hemoglobin: 11.1 g/dL — ABNORMAL LOW (ref 12.0–15.0)
MCH: 30.3 pg (ref 26.0–34.0)
MCH: 30.5 pg (ref 26.0–34.0)
MCHC: 33.1 g/dL (ref 30.0–36.0)
MCHC: 33.4 g/dL (ref 30.0–36.0)
MCV: 91.5 fL (ref 78.0–100.0)
MCV: 91.2 fL (ref 78.0–100.0)
Platelets: 216 10*3/uL (ref 150–400)
Platelets: 192 10*3/uL (ref 150–400)
RBC: 3.76 MIL/uL — ABNORMAL LOW (ref 3.87–5.11)
RBC: 3.64 MIL/uL — ABNORMAL LOW (ref 3.87–5.11)
RDW: 13.9 % (ref 11.5–15.5)
RDW: 13.9 % (ref 11.5–15.5)
WBC: 9.5 10*3/uL (ref 4.0–10.5)
WBC: 7.6 10*3/uL (ref 4.0–10.5)

## 2015-08-02 LAB — CBC WITH DIFFERENTIAL/PLATELET
BASOS ABS: 0.1 10*3/uL (ref 0.0–0.1)
Basophils Relative: 0 %
Eosinophils Absolute: 0.2 10*3/uL (ref 0.0–0.7)
Eosinophils Relative: 2 %
HEMATOCRIT: 36.8 % (ref 36.0–46.0)
Hemoglobin: 12.5 g/dL (ref 12.0–15.0)
LYMPHS ABS: 2.4 10*3/uL (ref 0.7–4.0)
LYMPHS PCT: 21 %
MCH: 30.7 pg (ref 26.0–34.0)
MCHC: 34 g/dL (ref 30.0–36.0)
MCV: 90.4 fL (ref 78.0–100.0)
MONO ABS: 0.7 10*3/uL (ref 0.1–1.0)
MONOS PCT: 6 %
NEUTROS ABS: 7.9 10*3/uL — AB (ref 1.7–7.7)
Neutrophils Relative %: 71 %
Platelets: 227 10*3/uL (ref 150–400)
RBC: 4.07 MIL/uL (ref 3.87–5.11)
RDW: 13.8 % (ref 11.5–15.5)
WBC: 11.2 10*3/uL — ABNORMAL HIGH (ref 4.0–10.5)

## 2015-08-02 LAB — COMPREHENSIVE METABOLIC PANEL
ALT: 56 U/L — ABNORMAL HIGH (ref 14–54)
ANION GAP: 10 (ref 5–15)
AST: 106 U/L — ABNORMAL HIGH (ref 15–41)
Albumin: 3.6 g/dL (ref 3.5–5.0)
Alkaline Phosphatase: 74 U/L (ref 38–126)
BUN: 16 mg/dL (ref 6–20)
CHLORIDE: 106 mmol/L (ref 101–111)
CO2: 25 mmol/L (ref 22–32)
CREATININE: 0.87 mg/dL (ref 0.44–1.00)
Calcium: 9 mg/dL (ref 8.9–10.3)
Glucose, Bld: 240 mg/dL — ABNORMAL HIGH (ref 65–99)
POTASSIUM: 4.1 mmol/L (ref 3.5–5.1)
SODIUM: 141 mmol/L (ref 135–145)
Total Bilirubin: 0.4 mg/dL (ref 0.3–1.2)
Total Protein: 5.7 g/dL — ABNORMAL LOW (ref 6.5–8.1)

## 2015-08-02 LAB — TROPONIN I
TROPONIN I: 0.26 ng/mL — AB (ref <0.031)
TROPONIN I: 2.09 ng/mL — AB (ref <0.031)
Troponin I: <0.03 ng/mL (ref <0.031)
Troponin I: 2.67 ng/mL (ref <0.031)

## 2015-08-02 LAB — POCT ACTIVATED CLOTTING TIME: ACTIVATED CLOTTING TIME: 394 s

## 2015-08-02 LAB — APTT: APTT: 32 s (ref 24–37)

## 2015-08-02 LAB — BASIC METABOLIC PANEL
Anion gap: 13 (ref 5–15)
BUN: 17 mg/dL (ref 6–20)
CHLORIDE: 102 mmol/L (ref 101–111)
CO2: 24 mmol/L (ref 22–32)
Calcium: 9.7 mg/dL (ref 8.9–10.3)
Creatinine, Ser: 1 mg/dL (ref 0.44–1.00)
GFR calc Af Amer: >60 mL/min (ref >60)
GFR calc non Af Amer: 56 mL/min — ABNORMAL LOW (ref >60)
GLUCOSE: 304 mg/dL — AB (ref 65–99)
POTASSIUM: 3.8 mmol/L (ref 3.5–5.1)
Sodium: 139 mmol/L (ref 135–145)

## 2015-08-02 LAB — GLUCOSE, CAPILLARY
GLUCOSE-CAPILLARY: 212 mg/dL — AB (ref 65–99)
GLUCOSE-CAPILLARY: 206 mg/dL — AB (ref 65–99)
Glucose-Capillary: 174 mg/dL — ABNORMAL HIGH (ref 65–99)

## 2015-08-02 LAB — MRSA PCR SCREENING: MRSA by PCR: NEGATIVE

## 2015-08-02 LAB — I-STAT TROPONIN, ED: Troponin i, poc: 0.02 ng/mL (ref 0.00–0.08)

## 2015-08-02 LAB — HEPARIN LEVEL (UNFRACTIONATED)

## 2015-08-02 LAB — PROTIME-INR
INR: 0.97 (ref 0.00–1.49)
PROTHROMBIN TIME: 13.1 s (ref 11.6–15.2)

## 2015-08-02 SURGERY — LEFT HEART CATH AND CORONARY ANGIOGRAPHY
Anesthesia: LOCAL

## 2015-08-02 MED ORDER — ATROPINE SULFATE 0.1 MG/ML IJ SOLN
INTRAMUSCULAR | Status: AC
  Filled 2015-08-02: qty 10

## 2015-08-02 MED ORDER — SODIUM CHLORIDE 0.9 % IV SOLN
INTRAVENOUS | Status: DC
  Administered 2015-08-02 (×2): via INTRAVENOUS

## 2015-08-02 MED ORDER — LIDOCAINE HCL (PF) 1 % IJ SOLN
INTRAMUSCULAR | Status: DC | PRN
  Administered 2015-08-02: 2 mL
  Administered 2015-08-02: 15 mL

## 2015-08-02 MED ORDER — LEVALBUTEROL HCL 0.63 MG/3ML IN NEBU
0.6300 mg | INHALATION_SOLUTION | Freq: Once | RESPIRATORY_TRACT | Status: AC
  Administered 2015-08-02: 0.63 mg via RESPIRATORY_TRACT
  Filled 2015-08-02: qty 3

## 2015-08-02 MED ORDER — SODIUM CHLORIDE 0.9 % IV SOLN: INTRAVENOUS | Status: DC

## 2015-08-02 MED ORDER — ASPIRIN 81 MG PO CHEW
324.0000 mg | CHEWABLE_TABLET | Freq: Once | ORAL | Status: AC
  Administered 2015-08-02: 324 mg via ORAL
  Filled 2015-08-02: qty 4

## 2015-08-02 MED ORDER — MIDAZOLAM HCL 2 MG/2ML IJ SOLN
INTRAMUSCULAR | Status: AC
  Filled 2015-08-02: qty 2

## 2015-08-02 MED ORDER — ONDANSETRON HCL 4 MG/2ML IJ SOLN: 4.0000 mg | Freq: Four times a day (QID) | INTRAMUSCULAR | Status: DC | PRN

## 2015-08-02 MED ORDER — HEPARIN (PORCINE) IN NACL 100-0.45 UNIT/ML-% IJ SOLN
900.0000 [IU]/h | INTRAMUSCULAR | Status: DC
  Administered 2015-08-02: 900 [IU]/h via INTRAVENOUS
  Filled 2015-08-02: qty 250

## 2015-08-02 MED ORDER — MORPHINE SULFATE (PF) 4 MG/ML IV SOLN
4.0000 mg | Freq: Once | INTRAVENOUS | Status: AC
  Administered 2015-08-02: 4 mg via INTRAVENOUS
  Filled 2015-08-02: qty 1

## 2015-08-02 MED ORDER — HEPARIN (PORCINE) IN NACL 100-0.45 UNIT/ML-% IJ SOLN
1150.0000 [IU]/h | INTRAMUSCULAR | Status: DC
Start: 2015-08-02 — End: 2015-08-02

## 2015-08-02 MED ORDER — HEPARIN BOLUS VIA INFUSION
2000.0000 [IU] | Freq: Once | INTRAVENOUS | Status: DC
  Filled 2015-08-02: qty 2000

## 2015-08-02 MED ORDER — BIVALIRUDIN 250 MG IV SOLR
INTRAVENOUS | Status: AC
  Filled 2015-08-02: qty 250

## 2015-08-02 MED ORDER — ATORVASTATIN CALCIUM 80 MG PO TABS
80.0000 mg | ORAL_TABLET | Freq: Every day | ORAL | Status: DC
  Administered 2015-08-02: 18:00:00 80 mg via ORAL
  Filled 2015-08-02: qty 1

## 2015-08-02 MED ORDER — SODIUM CHLORIDE 0.9 % IV SOLN
250.0000 mg | INTRAVENOUS | Status: DC | PRN
  Administered 2015-08-02: 1.75 mg/kg/h via INTRAVENOUS
  Administered 2015-08-02: 250 mg

## 2015-08-02 MED ORDER — NITROGLYCERIN 1 MG/10 ML FOR IR/CATH LAB
INTRA_ARTERIAL | Status: DC | PRN
  Administered 2015-08-02 (×3): 200 ug via INTRACORONARY

## 2015-08-02 MED ORDER — SODIUM CHLORIDE 0.9% FLUSH
3.0000 mL | Freq: Two times a day (BID) | INTRAVENOUS | Status: DC
  Administered 2015-08-03: 3 mL via INTRAVENOUS

## 2015-08-02 MED ORDER — HYDRALAZINE HCL 20 MG/ML IJ SOLN
10.0000 mg | Freq: Once | INTRAMUSCULAR | Status: AC
  Administered 2015-08-02: 10 mg via INTRAVENOUS
  Filled 2015-08-02: qty 1

## 2015-08-02 MED ORDER — INSULIN NPH (HUMAN) (ISOPHANE) 100 UNIT/ML ~~LOC~~ SUSP
45.0000 [IU] | Freq: Two times a day (BID) | SUBCUTANEOUS | Status: DC
  Administered 2015-08-02 – 2015-08-03 (×2): 45 [IU] via SUBCUTANEOUS
  Filled 2015-08-02 (×2): qty 10

## 2015-08-02 MED ORDER — IOHEXOL 350 MG/ML SOLN
INTRAVENOUS | Status: DC | PRN
  Administered 2015-08-02: 350 mL via INTRA_ARTERIAL

## 2015-08-02 MED ORDER — FENTANYL CITRATE (PF) 100 MCG/2ML IJ SOLN
INTRAMUSCULAR | Status: AC
  Filled 2015-08-02: qty 2

## 2015-08-02 MED ORDER — SODIUM CHLORIDE 0.9 % IV SOLN: 250.0000 mL | INTRAVENOUS | Status: DC | PRN

## 2015-08-02 MED ORDER — TICAGRELOR 90 MG PO TABS
90.0000 mg | ORAL_TABLET | Freq: Two times a day (BID) | ORAL | Status: DC
  Administered 2015-08-03 (×2): 90 mg via ORAL
  Filled 2015-08-02: qty 1

## 2015-08-02 MED ORDER — ISOSORBIDE MONONITRATE ER 30 MG PO TB24: 15.0000 mg | ORAL_TABLET | Freq: Every day | ORAL | Status: DC

## 2015-08-02 MED ORDER — DILTIAZEM HCL ER COATED BEADS 120 MG PO TB24
120.0000 mg | ORAL_TABLET | Freq: Every day | ORAL | Status: DC
Start: 2015-08-02 — End: 2015-08-03
  Administered 2015-08-02: 23:00:00 120 mg via ORAL
  Filled 2015-08-02 (×3): qty 1

## 2015-08-02 MED ORDER — HEPARIN (PORCINE) IN NACL 2-0.9 UNIT/ML-% IJ SOLN
INTRAMUSCULAR | Status: AC
  Filled 2015-08-02: qty 500

## 2015-08-02 MED ORDER — DILTIAZEM HCL 100 MG IV SOLR
5.0000 mg/h | INTRAVENOUS | Status: DC
  Administered 2015-08-02 (×2): 5 mg/h via INTRAVENOUS
  Filled 2015-08-02 (×3): qty 100

## 2015-08-02 MED ORDER — INSULIN ASPART 100 UNIT/ML ~~LOC~~ SOLN
0.0000 [IU] | Freq: Three times a day (TID) | SUBCUTANEOUS | Status: DC
  Administered 2015-08-02 (×2): 3 [IU] via SUBCUTANEOUS
  Administered 2015-08-03: 06:00:00 2 [IU] via SUBCUTANEOUS

## 2015-08-02 MED ORDER — VERAPAMIL HCL 2.5 MG/ML IV SOLN
INTRAVENOUS | Status: AC
  Filled 2015-08-02: qty 2

## 2015-08-02 MED ORDER — ONDANSETRON HCL 4 MG/2ML IJ SOLN
4.0000 mg | Freq: Once | INTRAMUSCULAR | Status: AC
  Administered 2015-08-02: 4 mg via INTRAVENOUS
  Filled 2015-08-02: qty 2

## 2015-08-02 MED ORDER — ATORVASTATIN CALCIUM 10 MG PO TABS: 10.0000 mg | ORAL_TABLET | Freq: Every day | ORAL | Status: DC

## 2015-08-02 MED ORDER — ROPINIROLE HCL 0.5 MG PO TABS
0.5000 mg | ORAL_TABLET | Freq: Every day | ORAL | Status: DC
  Administered 2015-08-02: 0.5 mg via ORAL
  Filled 2015-08-02 (×4): qty 1

## 2015-08-02 MED ORDER — HEPARIN SODIUM (PORCINE) 1000 UNIT/ML IJ SOLN
INTRAMUSCULAR | Status: AC
  Filled 2015-08-02: qty 1

## 2015-08-02 MED ORDER — LIDOCAINE HCL (PF) 1 % IJ SOLN
INTRAMUSCULAR | Status: AC
  Filled 2015-08-02: qty 30

## 2015-08-02 MED ORDER — ASPIRIN EC 81 MG PO TBEC
81.0000 mg | DELAYED_RELEASE_TABLET | Freq: Every day | ORAL | Status: DC
  Administered 2015-08-02: 81 mg via ORAL
  Filled 2015-08-02: qty 1

## 2015-08-02 MED ORDER — TICAGRELOR 90 MG PO TABS
ORAL_TABLET | ORAL | Status: DC | PRN
  Administered 2015-08-02: 180 mg via ORAL

## 2015-08-02 MED ORDER — CLOPIDOGREL BISULFATE 75 MG PO TABS
75.0000 mg | ORAL_TABLET | Freq: Every day | ORAL | Status: DC
  Administered 2015-08-02: 75 mg via ORAL
  Filled 2015-08-02: qty 1

## 2015-08-02 MED ORDER — FENTANYL CITRATE (PF) 100 MCG/2ML IJ SOLN
INTRAMUSCULAR | Status: DC | PRN
  Administered 2015-08-02 (×4): 25 ug via INTRAVENOUS

## 2015-08-02 MED ORDER — SODIUM CHLORIDE 0.9% FLUSH: 3.0000 mL | INTRAVENOUS | Status: DC | PRN

## 2015-08-02 MED ORDER — NITROGLYCERIN 2 % TD OINT
1.0000 [in_us] | TOPICAL_OINTMENT | Freq: Four times a day (QID) | TRANSDERMAL | Status: DC
  Administered 2015-08-02: 1 [in_us] via TOPICAL
  Filled 2015-08-02: qty 1

## 2015-08-02 MED ORDER — ACETAMINOPHEN 325 MG PO TABS: 650.0000 mg | ORAL_TABLET | ORAL | Status: DC | PRN

## 2015-08-02 MED ORDER — TICAGRELOR 90 MG PO TABS
ORAL_TABLET | ORAL | Status: AC
  Filled 2015-08-02: qty 1

## 2015-08-02 MED ORDER — HEPARIN BOLUS VIA INFUSION
3000.0000 [IU] | Freq: Once | INTRAVENOUS | Status: AC
  Administered 2015-08-02: 3000 [IU] via INTRAVENOUS

## 2015-08-02 MED ORDER — HEPARIN (PORCINE) IN NACL 2-0.9 UNIT/ML-% IJ SOLN
INTRAMUSCULAR | Status: DC | PRN
  Administered 2015-08-02: 2000 mL

## 2015-08-02 MED ORDER — BIVALIRUDIN BOLUS VIA INFUSION - CUPID
INTRAVENOUS | Status: DC | PRN
  Administered 2015-08-02: 53.925 mg via INTRAVENOUS

## 2015-08-02 MED ORDER — LEVOTHYROXINE SODIUM 137 MCG PO TABS
137.0000 ug | ORAL_TABLET | Freq: Every day | ORAL | Status: DC
  Administered 2015-08-02 – 2015-08-03 (×2): 137 ug via ORAL
  Filled 2015-08-02 (×3): qty 1

## 2015-08-02 MED ORDER — MORPHINE SULFATE (PF) 4 MG/ML IV SOLN
4.0000 mg | Freq: Once | INTRAVENOUS | Status: AC
Start: 2015-08-02 — End: 2015-08-02
  Administered 2015-08-02: 4 mg via INTRAVENOUS
  Filled 2015-08-02: qty 1

## 2015-08-02 MED ORDER — HEPARIN (PORCINE) IN NACL 2-0.9 UNIT/ML-% IJ SOLN
INTRAMUSCULAR | Status: AC
  Filled 2015-08-02: qty 1000

## 2015-08-02 MED ORDER — NITROGLYCERIN 1 MG/10 ML FOR IR/CATH LAB
INTRA_ARTERIAL | Status: AC
  Filled 2015-08-02: qty 10

## 2015-08-02 MED ORDER — DIAZEPAM 5 MG PO TABS
5.0000 mg | ORAL_TABLET | ORAL | Status: DC | PRN
  Administered 2015-08-02: 5 mg via ORAL
  Filled 2015-08-02: qty 1

## 2015-08-02 MED ORDER — ASPIRIN EC 81 MG PO TBEC
81.0000 mg | DELAYED_RELEASE_TABLET | Freq: Every day | ORAL | Status: DC
  Administered 2015-08-03: 81 mg via ORAL
  Filled 2015-08-02: qty 1

## 2015-08-02 MED ORDER — ANGIOPLASTY BOOK
Freq: Once | Status: AC
  Administered 2015-08-02: 21:00:00
  Filled 2015-08-02: qty 1

## 2015-08-02 MED ORDER — DILTIAZEM LOAD VIA INFUSION
15.0000 mg | Freq: Once | INTRAVENOUS | Status: AC
  Administered 2015-08-02: 15 mg via INTRAVENOUS
  Filled 2015-08-02: qty 15

## 2015-08-02 MED ORDER — MIDAZOLAM HCL 2 MG/2ML IJ SOLN
INTRAMUSCULAR | Status: DC | PRN
  Administered 2015-08-02 (×4): 1 mg via INTRAVENOUS

## 2015-08-02 MED ORDER — ONDANSETRON HCL 4 MG PO TABS: 4.0000 mg | ORAL_TABLET | Freq: Four times a day (QID) | ORAL | Status: DC | PRN

## 2015-08-02 MED ORDER — HEART ATTACK BOUNCING BOOK
Freq: Once | Status: AC
  Administered 2015-08-02: 21:00:00
  Filled 2015-08-02: qty 1

## 2015-08-02 SURGICAL SUPPLY — 24 items
BALLN ANGIOSCULPT RX 2.5X6 (BALLOONS) ×2
BALLN TREK RX 2.5X8 (BALLOONS) ×2
BALLN ~~LOC~~ EMERGE MR 2.5X6 (BALLOONS) ×2
BALLOON ANGIOSCULPT RX 2.5X6 (BALLOONS) IMPLANT
BALLOON TREK RX 2.5X8 (BALLOONS) IMPLANT
BALLOON ~~LOC~~ EMERGE MR 2.5X6 (BALLOONS) IMPLANT
CATH INFINITI 5FR MULTPACK ANG (CATHETERS) ×1 IMPLANT
CATH OPTITORQUE TIG 4.0 5F (CATHETERS) ×1 IMPLANT
CATH VISTA GUIDE 6FR JR4 SH (CATHETERS) ×1 IMPLANT
GLIDESHEATH SLEND A-KIT 6F 22G (SHEATH) ×2 IMPLANT
GLIDESHEATH SLEND SS 6F .021 (SHEATH) ×1 IMPLANT
KIT ENCORE 26 ADVANTAGE (KITS) ×1 IMPLANT
KIT HEART LEFT (KITS) ×2 IMPLANT
PACK CARDIAC CATHETERIZATION (CUSTOM PROCEDURE TRAY) ×2 IMPLANT
SHEATH PINNACLE 5F 10CM (SHEATH) ×1 IMPLANT
SHEATH PINNACLE 6F 10CM (SHEATH) ×1 IMPLANT
STENT XIENCE ALPINE RX 2.5X8 (Permanent Stent) ×1 IMPLANT
SYR MEDRAD MARK V 150ML (SYRINGE) ×2 IMPLANT
TRANSDUCER W/STOPCOCK (MISCELLANEOUS) ×2 IMPLANT
TUBING CIL FLEX 10 FLL-RA (TUBING) ×2 IMPLANT
WIRE COUGAR XT STRL 190CM (WIRE) ×1 IMPLANT
WIRE EMERALD 3MM-J .035X150CM (WIRE) ×1 IMPLANT
WIRE PT2 MS 185 (WIRE) ×1 IMPLANT
WIRE SAFE-T 1.5MM-J .035X260CM (WIRE) ×2 IMPLANT

## 2015-08-02 NOTE — Progress Notes (Addendum)
CRITICAL VALUE ALERT  Critical value received:  Troponin 2.67   Date of notification:  08/02/15   Time of notification:  [id number]  Critical value read back:Yes.    Nurse who received alert:  Chaney Born RN  MD notified (1st page):  Nell Range PA  Time of first page:  [phone]  MD notified (2nd page):  Time of second page:  Responding MD:  Nell Range PA  Time MD responded:  [phone]

## 2015-08-02 NOTE — H&P (View-Only) (Signed)
CARDIOLOGY CONSULT NOTE   Patient ID: Virginia Huber MRN: [id number] DOB/AGE: 1945-10-23 70 y.o.  Admit Date: 08/02/2015 Referring Physician: TRH-Hernandez MD Primary Physician: Purvis Kilts, MD Consulting Cardiologist: Kate Sable MD Primary Cardiologist: Kate Sable MD Reason for Consultation: Chest Pain with known CAD  Clinical Summary Virginia Huber is a 70 y.o.female with known history of CAD, Hypertension, Hyperlipidemia, with PCI using  DES  of the proximal and mid circumflex lesion, on 04/03/2015.   She was in her usual state of health when she was awakened by chest pressure/heart burn. She states that they had eaten late that evening and thought it was related to that. She states she took a tums, but the chest burning and pain increased. She took one NTG and got clammy and very nauseated. She states that this was exactly how she felt with her last MI, in October, to include brief episode of atrial fib.  Of noted, she graduated from cardiac rehab 4 days ago.   She presented to the ER on 08/02/2015 with complaints of chest pain. On arrival BP 131/77 HR 122, O2 Sat 97%. Initial troponin 0.02, with repeat troponin 0.26.  Glucose of 304, WBC 11.2, CXR mild vascular congestion and borderline cardiomegaly. EKG, demonstrated atrial fivb with RVR, rate of 129 bpm with inferior lateral ST depression. She was treated with ASA, diltiazem IV bolus, and started on gtt. She was given IV heparin and started on a gtt, and treated also with morphine.   She converted to NSR in ER on diltiazem gtt and chest pain was immediately relieved. She feels tired and continues to have low grade nausea.     Medications Scheduled Medications: . aspirin EC  81 mg Oral Daily  . atorvastatin  10 mg Oral q1800  . clopidogrel  75 mg Oral Daily  . insulin aspart  0-9 Units Subcutaneous TID WC  . insulin NPH Human  45 Units Subcutaneous BID AC  . levothyroxine  137 mcg Oral QAC breakfast    . nitroGLYCERIN  1 inch Topical 4 times per day  . rOPINIRole  0.5 mg Oral QHS    Infusions: . sodium chloride 10 mL/hr at 08/02/15 0328  . diltiazem (CARDIZEM) infusion 5 mg/hr (08/02/15 0304)  . heparin 900 Units/hr (08/02/15 0225)      Past Medical History  Diagnosis Date  . HTN (hypertension)   . Heart block   . Hypothyroidism   . Ovarian tumor   . Coronary artery disease 11/30/2014    PCI to the OM1  . UTI (urinary tract infection) 05/08/2013  . Superficial fungus infection of skin 06/29/2013  . TIA (transient ischemic attack) 08/2001; ~ 2006  . GERD (gastroesophageal reflux disease)   . Arthritis   . Hypercholesteremia   . Anginal pain ([place])   . Myocardial infarction ([place]) 02/2012  . NSTEMI (non-ST elevated myocardial infarction) ([place]) 04/02/2015    3.0x18 mm Xience DES to the CFX  . Pneumonia 02/2012  . Type II diabetes mellitus ([place])   . Cutaneous lupus erythematosus   . History of blood transfusion 1980's    2nd surgical procedures  . Progressive angina ([place]) 11/2014    PTCA w/ scoring balloon to the OM1  . Pain with urination 05/08/2015    Past Surgical History  Procedure Laterality Date  . 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 Partial hysterectomy  1970's    left ovaries, then ovaries removed later due tumors   . Left oophorectomy  05/1979  nicked bowel, peritonitis, colostomy; colostomy reversed 1981   . Nuclear med stress test  10/2011    Small area of mild ischemia inferoapically.  . Abdominal aortagram N/A 01/03/2014    Procedure: ABDOMINAL [name];  Surgeon: Wellington Hampshire, MD;  Location: [facility] CATH LAB;  Service: Cardiovascular;  Laterality: N/A;  . Lower extremity angiogram N/A 01/03/2014    Procedure: LOWER EXTREMITY ANGIOGRAM;  Surgeon: Wellington Hampshire, MD;  Location: [facility] CATH LAB;  Service: Cardiovascular;  Laterality: N/A;  . Colostomy reversal  11/1979  . Colonoscopy  2005    Dr. Laural Golden: pancolonic divericula, polyp, path unknown currently  . Appendectomy   1970's  . Cholecystectomy open  1990's  . Excisional hemorrhoidectomy  1970's  . Abdominal hysterectomy  1972    "partial"  . 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 Eye surgery Left ~ 2001    "smoothed out wrinkle"  . Cardiac catheterization  2008    Tiny OM-2 with 90% narrowing. Med tx.  . Cardiac catheterization N/A 11/30/2014    Procedure: Left Heart Cath and Coronary Angiography;  Surgeon: Troy Sine, MD; LAD 20%, CFX 50%, OM1 95%, right PLB 30%, LV normal   . Cardiac catheterization N/A 11/30/2014    Procedure: Coronary Balloon Angioplasty;  Surgeon: Troy Sine, MD;  Angiosculpt scoring balloon and PTCA to the OM1 reducing stenosis from 95% to less than 10%  . Right oophorectomy  1970's  . Cardiac catheterization N/A 04/03/2015    Procedure: Left Heart Cath and Coronary Angiography;  Surgeon: Jolaine Artist, MD; dLAD 50%, CFX 90%, OM1 100%, PLA 15%, LVEDP 13    . Cardiac catheterization N/A 04/03/2015    Procedure: Coronary Stent Intervention;  Surgeon: Sherren Mocha, MD; 3.0x18 mm Xience DES to the CFX      Family History  Problem Relation Age of Onset  . Heart disease Mother     deceased  . Heart disease Father     deceased, heart disease  . Colon cancer Neg Hx   . Diabetes Brother   . Heart disease Brother   . Thyroid disease Brother   . Heart disease Sister   . Heart disease Brother   . Thyroid disease Brother   . Lupus Daughter     Social History Virginia Huber reports that she has never smoked. She has never used smokeless tobacco. Virginia Huber reports that she does not drink alcohol.  Review of Systems Complete review of systems are found to be negative unless outlined in H&P above.  Physical Examination Blood pressure 113/57, pulse 60, temperature 97.7 F (36.5 C), temperature source Oral, resp. rate 16, height _0  (1.6 m), weight 158 lb 8.2 oz (71.9 kg), SpO2 95 %.  Intake/Output Summary (Last 24 hours) at 08/02/15 0808 Last data  filed at 08/02/15 0323  Gross per 24 hour  Intake   20.7 ml  Output      0 ml  Net   20.7 ml    Telemetry: NSR with non-specific T-wave abnormalities.   GEN: No acute distress  HEENT: Conjunctiva and lids normal, oropharynx clear with moist mucosa. Neck: Supple, no elevated JVP or carotid bruits, no thyromegaly. Lungs: Clear to auscultation, nonlabored breathing at rest. Cardiac: Regular rate and rhythm, no S3 or significant systolic murmur, no pericardial rub. Abdomen: Soft, nontender, no hepatomegaly, bowel sounds present, no guarding or rebound. Extremities: No pitting edema, distal pulses 2+. Skin: Warm and dry. Musculoskeletal: No kyphosis. Neuropsychiatric:  Alert and oriented x3, affect grossly appropriate.  Prior Cardiac Testing/Procedures 1. Echocardiogram 05/24/2015 Left ventricle: The cavity size was normal. Wall thickness was increased in a pattern of mild LVH. Systolic function was normal. The estimated ejection fraction was in the range of 55% to 60%. Wall motion was normal; there were no regional wall motion abnormalities. Features are consistent with a pseudonormal left ventricular filling pattern, with concomitant abnormal relaxation and increased filling pressure (grade 2 diastolic dysfunction). - Aortic valve: Mildly calcified annulus. Trileaflet; mildly thickened leaflets. Valve area (VTI): 2.07 cm^2. Valve area (Vmax): 1.98 cm^2. - Mitral valve: Mildly calcified annulus. Mildly thickened leaflets .Left atrium: The atrium was severely dilated. - Atrial septum: No defect or patent foramen ovale was identified. - Technically adequate study.  3. Cardiac Cath 04/03/2015  Post Atrio lesion, 15% stenosed.  1st Mrg lesion, 100% stenosed.  Prox Cx to Mid Cx lesion, 90% stenosed.  Dist Cx lesion, 30% stenosed.  Dist LAD lesion, 50% stenosed.  Assessment:  1) Interval occlusion of previously OM1 PTCA site 2) CAD with high grade lesion in  proximal to mid LCX 3) Otherwise non-obstructive CAD 4) LVEDP = 13  Lab Results  Basic Metabolic Panel:  Recent Labs Lab 08/02/15 0108 08/02/15 0356  NA 139 141  K 3.8 4.1  CL 102 106  CO2 24 25  GLUCOSE 304* 240*  BUN 17 16  CREATININE 1.00 0.87  CALCIUM 9.7 9.0    Liver Function Tests:  Recent Labs Lab 08/02/15 0356  AST 106*  ALT 56*  ALKPHOS 74  BILITOT 0.4  PROT 5.7*  ALBUMIN 3.6    CBC:  Recent Labs Lab 08/02/15 0108 08/02/15 0356  WBC 11.2* 9.5  NEUTROABS 7.9*  --   HGB 12.5 11.4*  HCT 36.8 34.4*  MCV 90.4 91.5  PLT 227 216    Cardiac Enzymes:  Recent Labs Lab 08/02/15 0108 08/02/15 0356  TROPONINI <0.03 0.26*    Radiology: Dg Chest Port 1 View  08/02/2015  CLINICAL DATA:  Acute onset of generalized chest pain. Initial encounter. EXAM: PORTABLE CHEST 1 VIEW COMPARISON:  Chest radiograph performed 11/29/2014 FINDINGS: The lungs are well-aerated. There is elevation of the right hemidiaphragm, with mild bibasilar atelectasis. Mild vascular congestion is seen. There is no evidence of pleural effusion or pneumothorax. The cardiomediastinal silhouette is borderline enlarged. No acute osseous abnormalities are seen. IMPRESSION: Mild vascular congestion and borderline cardiomegaly. Elevation of the right hemidiaphragm, with mild bibasilar atelectasis. Electronically Signed   By: Garald Balding M.D.   On: 08/02/2015 01:31     ECG: Competed this am. NSR with ST inversion Lead I and V4, V5.     Impression and Recommendations  1 CAD: Known history of disease of the CX with PTCA and DES to the proximal and mid Cx (90%) stenosis in October of 2016, with distal 30%, Distal LAD 50%, and lst Marginal 100%. (nterval occlusion of previously OM1 PTCA site). She is currently pain free with NTG paste topically. Troponin slightly elevated this am at 0.26 which could be potentially demand ischemia in the setting of Afib RVR. She continues on heparin gtt.    Recommend transfer to [facility] for repeat cardiac cath with ischemic changes per EKG which have progressed since initial EKG from EKG demonstrating conversion to NSR. Will discuss with Dr. Bronson Ing.  Continue Plavix, ASA, heparin, statin. Echo has been ordered. Consider changing diltiazem to BB.   2. PAF: Now in NSR with diltiazem gtt at 5 mg/hour. Symptomatic  with rapid HR, for chest pain, nausea and diaphoresis. Now asymptomatic with return to NSR. CHDAS VASC Score 5.   3. Hypertension: Currently controlled.   4. Diabetes: Hyperglycemic on admission. She states that she has had difficulty controlling BP. She is being referred to endocrinologist as OP, but has not had the appointment yet.    Signed: Phill Myron. Lawrence NP Mona  08/02/2015, 8:08 AM Co-Sign MD  The patient was seen and examined, and I agree with the assessment and plan as documented above, with modifications as noted below. Mrs. Couchman is well known to me. I most recently evaluated her in the clinic one week ago at which time she was doing well. She was admitted with chest pain in the context of rapid atrial fibrillation and has since converted to sinus rhythm on IV diltiazem. She was started on heparin by the admitting hospitalist. She is already on ASA, Plavix, labetalol, losartan, and Lipitor as an outpatient.  She has some nausea but denies chest pain and has a nitroglycerin patch. Denies shortness of breath. Said symptoms last night were similar to what she experienced in 2013, and had atrial fibrillation at that time.  Troponins: 0.03-->0.26-->2.09.  While she denies chest pain at present, I think it would be reasonable to proceed with coronary angiography as her troponin elevation is higher than I would have expected for simply rapid atrial fibrillation, and is indicative of a non-STEMI.  Will transfer to [facility]. She will eventually need initiation of a direct oral anticoagulant for paroxysmal atrial  fibrillation (CHA2DS2Vasc score of 5), and I suspect dual antiplatelet therapy course will have to be shortened for this reason.  I have discussed this with the patient and her husband, and they are in agreement with this plan.   Kate Sable, MD, [facility]  08/02/2015 9:48 AM

## 2015-08-02 NOTE — Progress Notes (Signed)
Pt left the floor with [facility] staff.  Belongings given to her husband.  Cardiology D/C'ed nitro patch.  [facility] dispatch notified me that the patient did not have a room assignment at the time of transport.  If receiving nurse needs report, see the sticky note for contact info.  Vitals signs as follows:  Temp: 97.7 F (36.5 C) (02/10 0323) Temp Source: Oral (02/10 0323) BP: 113/57 mmHg (02/10 0645) Pulse Rate: 60 (02/10 0645) Respirations: 16

## 2015-08-02 NOTE — Progress Notes (Signed)
Received call from Leonia Reader, NP. Patient with NSTEMI and will be transferred to [facility] under cardiology service for a cath. Will sign off at this time. Please reconsult the hospitalist service if we can be of further assistance.  Domingo Mend, MD [facility] Pager: [phone]

## 2015-08-02 NOTE — Interval H&P Note (Signed)
Cath Lab Visit (complete for each Cath Lab visit)  Clinical Evaluation Leading to the Procedure:   ACS: Yes.    Non-ACS:    Anginal Classification: CCS IV  Anti-ischemic medical therapy: Maximal Therapy (2 or more classes of medications)  Non-Invasive Test Results: No non-invasive testing performed  Prior CABG: No previous CABG      History and Physical Interval Note:  08/02/2015 12:22 PM  [name]  has presented today for surgery, with the diagnosis of cp  The various methods of treatment have been discussed with the patient and family. After consideration of risks, benefits and other options for treatment, the patient has consented to  Procedure(s): Left Heart Cath and Coronary Angiography (N/A) as a surgical intervention .  The patient's history has been reviewed, patient examined, no change in status, stable for surgery.  I have reviewed the patient's chart and labs.  Questions were answered to the patient's satisfaction.     KELLY,THOMAS A

## 2015-08-02 NOTE — Consult Note (Signed)
  CARDIOLOGY CONSULT NOTE   Patient ID: Virginia Huber MRN: [id number] DOB/AGE: 01/29/1946 69 y.o.  Admit Date: 08/02/2015 Referring Physician: TRH-Hernandez MD Primary Physician: GOLDING, JOHN CABOT, MD Consulting Cardiologist: Tritia Endo MD Primary Cardiologist: Heidi Maclin MD Reason for Consultation: Chest Pain with known CAD  Clinical Summary Virginia Huber is a 69 y.o.female with known history of CAD, Hypertension, Hyperlipidemia, with PCI using  DES  of the proximal and mid circumflex lesion, on 04/03/2015.   She was in her usual state of health when she was awakened by chest pressure/heart burn. She states that they had eaten late that evening and thought it was related to that. She states she took a tums, but the chest burning and pain increased. She took one NTG and got clammy and very nauseated. She states that this was exactly how she felt with her last MI, in October, to include brief episode of atrial fib.  Of noted, she graduated from cardiac rehab 4 days ago.   She presented to the ER on 08/02/2015 with complaints of chest pain. On arrival BP 131/77 HR 122, O2 Sat 97%. Initial troponin 0.02, with repeat troponin 0.26.  Glucose of 304, WBC 11.2, CXR mild vascular congestion and borderline cardiomegaly. EKG, demonstrated atrial fivb with RVR, rate of 129 bpm with inferior lateral ST depression. She was treated with ASA, diltiazem IV bolus, and started on gtt. She was given IV heparin and started on a gtt, and treated also with morphine.   She converted to NSR in ER on diltiazem gtt and chest pain was immediately relieved. She feels tired and continues to have low grade nausea.     Medications Scheduled Medications: . aspirin EC  81 mg Oral Daily  . atorvastatin  10 mg Oral q1800  . clopidogrel  75 mg Oral Daily  . insulin aspart  0-9 Units Subcutaneous TID WC  . insulin NPH Human  45 Units Subcutaneous BID AC  . levothyroxine  137 mcg Oral QAC breakfast    . nitroGLYCERIN  1 inch Topical 4 times per day  . rOPINIRole  0.5 mg Oral QHS    Infusions: . sodium chloride 10 mL/hr at 08/02/15 0328  . diltiazem (CARDIZEM) infusion 5 mg/hr (08/02/15 0304)  . heparin 900 Units/hr (08/02/15 0225)      Past Medical History  Diagnosis Date  . HTN (hypertension)   . Heart block   . Hypothyroidism   . Ovarian tumor   . Coronary artery disease 11/30/2014    PCI to the OM1  . UTI (urinary tract infection) 05/08/2013  . Superficial fungus infection of skin 06/29/2013  . TIA (transient ischemic attack) 08/2001; ~ 2006  . GERD (gastroesophageal reflux disease)   . Arthritis   . Hypercholesteremia   . Anginal pain (HCC)   . Myocardial infarction (HCC) 02/2012  . NSTEMI (non-ST elevated myocardial infarction) (HCC) 04/02/2015    3.0x18 mm Xience DES to the CFX  . Pneumonia 02/2012  . Type II diabetes mellitus (HCC)   . Cutaneous lupus erythematosus   . History of blood transfusion 1980's    2nd surgical procedures  . Progressive angina (HCC) 11/2014    PTCA w/ scoring balloon to the OM1  . Pain with urination 05/08/2015    Past Surgical History  Procedure Laterality Date  . 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 Partial hysterectomy  1970's    left ovaries, then ovaries removed later due tumors   . Left oophorectomy  05/1979      nicked bowel, peritonitis, colostomy; colostomy reversed 1981   . Nuclear med stress test  10/2011    Small area of mild ischemia inferoapically.  . Abdominal aortagram N/A 01/03/2014    Procedure: ABDOMINAL AORTAGRAM;  Surgeon: Muhammad A Arida, MD;  Location: [facility] CATH [facility];  Service: Cardiovascular;  Laterality: N/A;  . Lower extremity angiogram N/A 01/03/2014    Procedure: LOWER EXTREMITY ANGIOGRAM;  Surgeon: Muhammad A Arida, MD;  Location: [facility] CATH [facility];  Service: Cardiovascular;  Laterality: N/A;  . Colostomy reversal  11/1979  . Colonoscopy  2005    Dr. Rehman: pancolonic divericula, polyp, path unknown currently  . Appendectomy   1970's  . Cholecystectomy open  1990's  . Excisional hemorrhoidectomy  1970's  . Abdominal hysterectomy  1972    "partial"  . 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 Eye surgery Left ~ 2001    "smoothed out wrinkle"  . Cardiac catheterization  2008    Tiny OM-2 with 90% narrowing. Med tx.  . Cardiac catheterization N/A 11/30/2014    Procedure: Left Heart Cath and Coronary Angiography;  Surgeon: Thomas A Kelly, MD; LAD 20%, CFX 50%, OM1 95%, right PLB 30%, LV normal   . Cardiac catheterization N/A 11/30/2014    Procedure: Coronary Balloon Angioplasty;  Surgeon: Thomas A Kelly, MD;  Angiosculpt scoring balloon and PTCA to the OM1 reducing stenosis from 95% to less than 10%  . Right oophorectomy  1970's  . Cardiac catheterization N/A 04/03/2015    Procedure: Left Heart Cath and Coronary Angiography;  Surgeon: Daniel R Bensimhon, MD; dLAD 50%, CFX 90%, OM1 100%, PLA 15%, LVEDP 13    . Cardiac catheterization N/A 04/03/2015    Procedure: Coronary Stent Intervention;  Surgeon: Michael Cooper, MD; 3.0x18 mm Xience DES to the CFX      Family History  Problem Relation Age of Onset  . Heart disease Mother     deceased  . Heart disease Father     deceased, heart disease  . Colon cancer Neg Hx   . Diabetes Brother   . Heart disease Brother   . Thyroid disease Brother   . Heart disease Sister   . Heart disease Brother   . Thyroid disease Brother   . Lupus Daughter     Social History Ms. Lopez reports that she has never smoked. She has never used smokeless tobacco. Ms. Everding reports that she does not drink alcohol.  Review of Systems Complete review of systems are found to be negative unless outlined in H&P above.  Physical Examination Blood pressure 113/57, pulse 60, temperature 97.7 F (36.5 C), temperature source Oral, resp. rate 16, height 5' 3" (1.6 m), weight 158 lb 8.2 oz (71.9 kg), SpO2 95 %.  Intake/Output Summary (Last 24 hours) at 08/02/15 0808 Last data  filed at 08/02/15 0323  Gross per 24 hour  Intake   20.7 ml  Output      0 ml  Net   20.7 ml    Telemetry: NSR with non-specific T-wave abnormalities.   GEN: No acute distress  HEENT: Conjunctiva and lids normal, oropharynx clear with moist mucosa. Neck: Supple, no elevated JVP or carotid bruits, no thyromegaly. Lungs: Clear to auscultation, nonlabored breathing at rest. Cardiac: Regular rate and rhythm, no S3 or significant systolic murmur, no pericardial rub. Abdomen: Soft, nontender, no hepatomegaly, bowel sounds present, no guarding or rebound. Extremities: No pitting edema, distal pulses 2+. Skin: Warm and dry. Musculoskeletal: No kyphosis. Neuropsychiatric:   Alert and oriented x3, affect grossly appropriate.  Prior Cardiac Testing/Procedures 1. Echocardiogram 05/24/2015 Left ventricle: The cavity size was normal. Wall thickness was increased in a pattern of mild LVH. Systolic function was normal. The estimated ejection fraction was in the range of 55% to 60%. Wall motion was normal; there were no regional wall motion abnormalities. Features are consistent with a pseudonormal left ventricular filling pattern, with concomitant abnormal relaxation and increased filling pressure (grade 2 diastolic dysfunction). - Aortic valve: Mildly calcified annulus. Trileaflet; mildly thickened leaflets. Valve area (VTI): 2.07 cm^2. Valve area (Vmax): 1.98 cm^2. - Mitral valve: Mildly calcified annulus. Mildly thickened leaflets .Left atrium: The atrium was severely dilated. - Atrial septum: No defect or patent foramen ovale was identified. - Technically adequate study.  3. Cardiac Cath 04/03/2015  Post Atrio lesion, 15% stenosed.  1st Mrg lesion, 100% stenosed.  Prox Cx to Mid Cx lesion, 90% stenosed.  Dist Cx lesion, 30% stenosed.  Dist LAD lesion, 50% stenosed.  Assessment:  1) Interval occlusion of previously OM1 PTCA site 2) CAD with high grade lesion in  proximal to mid LCX 3) Otherwise non-obstructive CAD 4) LVEDP = 13  Lab Results  Basic Metabolic Panel:  Recent Labs Lab 08/02/15 0108 08/02/15 0356  NA 139 141  K 3.8 4.1  CL 102 106  CO2 24 25  GLUCOSE 304* 240*  BUN 17 16  CREATININE 1.00 0.87  CALCIUM 9.7 9.0    Liver Function Tests:  Recent Labs Lab 08/02/15 0356  AST 106*  ALT 56*  ALKPHOS 74  BILITOT 0.4  PROT 5.7*  ALBUMIN 3.6    CBC:  Recent Labs Lab 08/02/15 0108 08/02/15 0356  WBC 11.2* 9.5  NEUTROABS 7.9*  --   HGB 12.5 11.4*  HCT 36.8 34.4*  MCV 90.4 91.5  PLT 227 216    Cardiac Enzymes:  Recent Labs Lab 08/02/15 0108 08/02/15 0356  TROPONINI <0.03 0.26*    Radiology: Dg Chest Port 1 View  08/02/2015  CLINICAL DATA:  Acute onset of generalized chest pain. Initial encounter. EXAM: PORTABLE CHEST 1 VIEW COMPARISON:  Chest radiograph performed 11/29/2014 FINDINGS: The lungs are well-aerated. There is elevation of the right hemidiaphragm, with mild bibasilar atelectasis. Mild vascular congestion is seen. There is no evidence of pleural effusion or pneumothorax. The cardiomediastinal silhouette is borderline enlarged. No acute osseous abnormalities are seen. IMPRESSION: Mild vascular congestion and borderline cardiomegaly. Elevation of the right hemidiaphragm, with mild bibasilar atelectasis. Electronically Signed   By: Jeffery  Chang M.D.   On: 08/02/2015 01:31     ECG: Competed this am. NSR with ST inversion Lead I and V4, V5.     Impression and Recommendations  1 CAD: Known history of disease of the CX with PTCA and DES to the proximal and mid Cx (90%) stenosis in October of 2016, with distal 30%, Distal LAD 50%, and lst Marginal 100%. (nterval occlusion of previously OM1 PTCA site). She is currently pain free with NTG paste topically. Troponin slightly elevated this am at 0.26 which could be potentially demand ischemia in the setting of Afib RVR. She continues on heparin gtt.    Recommend transfer to [facility] for repeat cardiac cath with ischemic changes per EKG which have progressed since initial EKG from EKG demonstrating conversion to NSR. Will discuss with Dr. Vandora Jaskulski.  Continue Plavix, ASA, heparin, statin. Echo has been ordered. Consider changing diltiazem to BB.   2. PAF: Now in NSR with diltiazem gtt at 5 mg/hour. Symptomatic   with rapid HR, for chest pain, nausea and diaphoresis. Now asymptomatic with return to NSR. CHDAS VASC Score 5.   3. Hypertension: Currently controlled.   4. Diabetes: Hyperglycemic on admission. She states that she has had difficulty controlling BP. She is being referred to endocrinologist as OP, but has not had the appointment yet.    Signed: Kathryn M. Lawrence NP AACC  08/02/2015, 8:08 AM Co-Sign MD  The patient was seen and examined, and I agree with the assessment and plan as documented above, with modifications as noted below. Virginia Huber is well known to me. I most recently evaluated her in the clinic one week ago at which time she was doing well. She was admitted with chest pain in the context of rapid atrial fibrillation and has since converted to sinus rhythm on IV diltiazem. She was started on heparin by the admitting hospitalist. She is already on ASA, Plavix, labetalol, losartan, and Lipitor as an outpatient.  She has some nausea but denies chest pain and has a nitroglycerin patch. Denies shortness of breath. Said symptoms last night were similar to what she experienced in 2013, and had atrial fibrillation at that time.  Troponins: 0.03-->0.26-->2.09.  While she denies chest pain at present, I think it would be reasonable to proceed with coronary angiography as her troponin elevation is higher than I would have expected for simply rapid atrial fibrillation, and is indicative of a non-STEMI.  Will transfer to [facility]. She will eventually need initiation of a direct oral anticoagulant for paroxysmal atrial  fibrillation (CHA2DS2Vasc score of 5), and I suspect dual antiplatelet therapy course will have to be shortened for this reason.  I have discussed this with the patient and her husband, and they are in agreement with this plan.   Layton Naves, MD, FACC  08/02/2015 9:48 AM   

## 2015-08-02 NOTE — ED Notes (Signed)
Pt states she has a tightness in the center of her chest that woke her from sleep, states she tried some tums without relief and then took 2 nitro without change.  Pt states this feels the same as when she had an MI in October.

## 2015-08-02 NOTE — Progress Notes (Addendum)
[place] for Heparin Indication: chest pain/ACS and atrial fibrillation  Allergies  Allergen Reactions  . Penicillins Hives    Has patient had a PCN reaction causing immediate rash, facial/tongue/throat swelling, SOB or lightheadedness with hypotension: No Has patient had a PCN reaction causing severe rash involving mucus membranes or skin necrosis: No Has patient had a PCN reaction that required hospitalization No Has patient had a PCN reaction occurring within the last 10 years: No If all of the above answers are "NO", then may proceed with Cephalosporin use.  [name] Percocet [Oxycodone-Acetaminophen] Nausea And Vomiting   Patient Measurements: Height: 5\' 3"  (160 cm) Weight: 158 lb 8.2 oz (71.9 kg) IBW/kg (Calculated) : 52.4 HEPARIN DW (KG): 67.4  Vital Signs: Temp: 97.7 F (36.5 C) (02/10 0323) Temp Source: Oral (02/10 0323) BP: 113/57 mmHg (02/10 0645) Pulse Rate: 60 (02/10 0645)  Labs:  Recent Labs  08/02/15 0108 08/02/15 0356 08/02/15 0903  HGB 12.5 11.4* 11.1*  HCT 36.8 34.4* 33.2*  PLT 227 216 192  APTT 32  --   --   LABPROT 13.1  --   --   INR 0.97  --   --   HEPARINUNFRC  --   --  <0.10*  CREATININE 1.00 0.87  --   TROPONINI <0.03 0.26* 2.09*    Estimated Creatinine Clearance: 57.2 mL/min (by C-G formula based on Cr of 0.87).   Medical History: Past Medical History  Diagnosis Date  . HTN (hypertension)   . Heart block   . Hypothyroidism   . Ovarian tumor   . Coronary artery disease 11/30/2014    PCI to the OM1  . UTI (urinary tract infection) 05/08/2013  . Superficial fungus infection of skin 06/29/2013  . TIA (transient ischemic attack) 08/2001; ~ 2006  . GERD (gastroesophageal reflux disease)   . Arthritis   . Hypercholesteremia   . Anginal pain ([place])   . Myocardial infarction ([place]) 02/2012  . NSTEMI (non-ST elevated myocardial infarction) ([place]) 04/02/2015    3.0x18 mm Xience DES to the CFX  . Pneumonia  02/2012  . Type II diabetes mellitus ([place])   . Cutaneous lupus erythematosus   . History of blood transfusion 1980's    2nd surgical procedures  . Progressive angina ([place]) 11/2014    PTCA w/ scoring balloon to the OM1  . Pain with urination 05/08/2015    Medications:  Prescriptions prior to admission  Medication Sig Dispense Refill Last Dose  . amLODipine (NORVASC) 10 MG tablet TAKE ONE (1) TABLET EACH DAY 90 tablet 3 08/01/2015 at Unknown time  . aspirin EC 81 MG tablet Take 1 tablet (81 mg total) by mouth daily.   08/01/2015 at Unknown time  . atorvastatin (LIPITOR) 10 MG tablet Take 1 tablet (10 mg total) by mouth daily at 6 PM. 90 tablet 3 08/01/2015 at Unknown time  . cholecalciferol (VITAMIN D) 1000 UNITS tablet Take 1,000 Units by mouth at bedtime.    08/01/2015 at Unknown time  . clopidogrel (PLAVIX) 75 MG tablet Take 1 tablet (75 mg total) by mouth daily. 30 tablet 6 08/01/2015 at Unknown time  . furosemide (LASIX) 20 MG tablet Take 1 tablet (20 mg total) by mouth as needed for edema (leg swelling). 30 tablet 3 08/01/2015 at Unknown time  . insulin NPH Human (HUMULIN N,NOVOLIN N) 100 UNIT/ML injection Inject 45 Units into the skin 2 (two) times daily before a meal.    08/01/2015 at Unknown time  .  labetalol (NORMODYNE) 300 MG tablet Take 1 tablet (300 mg total) by mouth 2 (two) times daily. 180 tablet 2 08/01/2015 at Unknown time  . levothyroxine (SYNTHROID, LEVOTHROID) 137 MCG tablet Take 137 mcg by mouth daily before breakfast.   08/01/2015 at Unknown time  . losartan (COZAAR) 100 MG tablet TAKE ONE (1) TABLET BY MOUTH EVERY DAY 90 tablet 3 08/01/2015 at Unknown time  . metFORMIN (GLUCOPHAGE) 500 MG tablet Take 500 mg by mouth 3 (three) times daily with meals.    08/01/2015 at Unknown time  . nitroGLYCERIN (NITROSTAT) 0.4 MG SL tablet Place 1 tablet (0.4 mg total) under the tongue every 5 (five) minutes as needed for chest pain. 25 tablet 12 08/01/2015 at Unknown time  . Omega-3 Fatty Acids (FISH OIL)  1200 MG CAPS Take 1 capsule (1,200 mg total) by mouth daily.   08/01/2015 at Unknown time  . potassium chloride (K-DUR) 10 MEQ tablet Take 1 tablet (10 mEq total) by mouth as needed (on days that you take your Lasix). 30 tablet 3 08/01/2015 at Unknown time  . rOPINIRole (REQUIP) 0.5 MG tablet Take 0.5 mg by mouth at bedtime.   0 08/01/2015 at Unknown time    Home meds reviewed.  Assessment: Okay for Protocol.  No bleeding complications noted per RN.  Goal of Therapy:  Heparin level 0.3-0.7 units/ml Monitor platelets by anticoagulation protocol: Yes   Plan:  Give 2000 unit bolus x 1, then increase infusion to 1150 units/hr Check anti-Xa in 6-8 hours and daily while on heparin Continue to monitor platelets and H&H  Isac Sarna, BS Pharm D, BCPS Clinical Pharmacist Pager [phone] 08/02/2015,11:50 AM  Addendum: [facility] prior to changes in heparin infusion.

## 2015-08-02 NOTE — ED Provider Notes (Signed)
CSN: [id number]     Arrival date & time 08/02/15  0058 History   First MD Initiated Contact with Patient 08/02/15 0103     Chief Complaint  Patient presents with  . Chest Pain     (Consider location/radiation/quality/duration/timing/severity/associated sxs/prior Treatment) Patient is a 70 y.o. female presenting with chest pain. The history is provided by the patient.  Chest Pain She had onset at about midnight of midsternal chest pain which she describes as sharp and tight and severe and she rates at 9/10. Pain is similar to what she had with a recent heart attack. She did take nitroglycerin without any relief. There is no associated dyspnea but she has had nausea and diaphoresis and lightheadedness. She denies palpitations. She took a dose of aspirin 81 mg this evening. Nothing makes her pain better nothing makes it worse.  Past Medical History  Diagnosis Date  . HTN (hypertension)   . Heart block   . Hypothyroidism   . Ovarian tumor   . Coronary artery disease 11/30/2014    PCI to the OM1  . UTI (urinary tract infection) 05/08/2013  . Superficial fungus infection of skin 06/29/2013  . TIA (transient ischemic attack) 08/2001; ~ 2006  . GERD (gastroesophageal reflux disease)   . Arthritis   . Hypercholesteremia   . Anginal pain ([place])   . Myocardial infarction ([place]) 02/2012  . NSTEMI (non-ST elevated myocardial infarction) ([place]) 04/02/2015    3.0x18 mm Xience DES to the CFX  . Pneumonia 02/2012  . Type II diabetes mellitus ([place])   . Cutaneous lupus erythematosus   . History of blood transfusion 1980's    2nd surgical procedures  . Progressive angina ([place]) 11/2014    PTCA w/ scoring balloon to the OM1  . Pain with urination 05/08/2015   Past Surgical History  Procedure Laterality Date  . 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 Partial hysterectomy  1970's    left ovaries, then ovaries removed later due tumors   . Left oophorectomy  05/1979    nicked bowel, peritonitis, colostomy; colostomy  reversed 1981   . Nuclear med stress test  10/2011    Small area of mild ischemia inferoapically.  . Abdominal aortagram N/A 01/03/2014    Procedure: ABDOMINAL [name];  Surgeon: Wellington Hampshire, MD;  Location: [facility] CATH LAB;  Service: Cardiovascular;  Laterality: N/A;  . Lower extremity angiogram N/A 01/03/2014    Procedure: LOWER EXTREMITY ANGIOGRAM;  Surgeon: Wellington Hampshire, MD;  Location: [place] CATH LAB;  Service: Cardiovascular;  Laterality: N/A;  . Colostomy reversal  11/1979  . Colonoscopy  2005    Dr. Laural Golden: pancolonic divericula, polyp, path unknown currently  . Appendectomy  1970's  . Cholecystectomy open  1990's  . Excisional hemorrhoidectomy  1970's  . Abdominal hysterectomy  1972    "partial"  . 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 Eye surgery Left ~ 2001    "smoothed out wrinkle"  . Cardiac catheterization  2008    Tiny OM-2 with 90% narrowing. Med tx.  . Cardiac catheterization N/A 11/30/2014    Procedure: Left Heart Cath and Coronary Angiography;  Surgeon: Troy Sine, MD; LAD 20%, CFX 50%, OM1 95%, right PLB 30%, LV normal   . Cardiac catheterization N/A 11/30/2014    Procedure: Coronary Balloon Angioplasty;  Surgeon: Troy Sine, MD;  Angiosculpt scoring balloon and PTCA to the OM1 reducing stenosis from 95% to less than 10%  . Right oophorectomy  1970's  .  Cardiac catheterization N/A 04/03/2015    Procedure: Left Heart Cath and Coronary Angiography;  Surgeon: Jolaine Artist, MD; dLAD 50%, CFX 90%, OM1 100%, PLA 15%, LVEDP 13    . Cardiac catheterization N/A 04/03/2015    Procedure: Coronary Stent Intervention;  Surgeon: Sherren Mocha, MD; 3.0x18 mm Xience DES to the CFX     Family History  Problem Relation Age of Onset  . Heart disease Mother     deceased  . Heart disease Father     deceased, heart disease  . Colon cancer Neg Hx   . Diabetes Brother   . Heart disease Brother   . Thyroid disease Brother   . Heart disease Sister   .  Heart disease Brother   . Thyroid disease Brother   . Lupus Daughter    Social History  Substance Use Topics  . Smoking status: Never Smoker   . Smokeless tobacco: Never Used  . Alcohol Use: No   OB History    Gravida Para Term Preterm AB TAB SAB Ectopic Multiple Living   3 2   1  1   2      Review of Systems  Cardiovascular: Positive for chest pain.  All other systems reviewed and are negative.     Allergies  Penicillins and Percocet  Home Medications   Prior to Admission medications   Medication Sig Start Date End Date Taking? Authorizing Provider  amLODipine (NORVASC) 10 MG tablet TAKE ONE (1) TABLET EACH DAY 07/26/15  Yes Herminio Commons, MD  aspirin EC 81 MG tablet Take 1 tablet (81 mg total) by mouth daily. 01/30/14  Yes Wellington Hampshire, MD  atorvastatin (LIPITOR) 10 MG tablet Take 1 tablet (10 mg total) by mouth daily at 6 PM. 04/04/15  Yes Rhonda G Barrett, PA-C  cholecalciferol (VITAMIN D) 1000 UNITS tablet Take 1,000 Units by mouth at bedtime.    Yes Historical Provider, MD  clopidogrel (PLAVIX) 75 MG tablet Take 1 tablet (75 mg total) by mouth daily. 02/13/15  Yes Herminio Commons, MD  furosemide (LASIX) 20 MG tablet Take 1 tablet (20 mg total) by mouth as needed for edema (leg swelling). 02/13/15  Yes Herminio Commons, MD  insulin NPH Human (HUMULIN N,NOVOLIN N) 100 UNIT/ML injection Inject 45 Units into the skin 2 (two) times daily before a meal.    Yes Historical Provider, MD  labetalol (NORMODYNE) 300 MG tablet Take 1 tablet (300 mg total) by mouth 2 (two) times daily. 05/02/15  Yes Herminio Commons, MD  levothyroxine (SYNTHROID, LEVOTHROID) 137 MCG tablet Take 137 mcg by mouth daily before breakfast.   Yes Historical Provider, MD  losartan (COZAAR) 100 MG tablet TAKE ONE (1) TABLET BY MOUTH EVERY DAY 06/10/15  Yes Herminio Commons, MD  metFORMIN (GLUCOPHAGE) 500 MG tablet Take 500 mg by mouth 3 (three) times daily with meals.    Yes Historical  Provider, MD  nitroGLYCERIN (NITROSTAT) 0.4 MG SL tablet Place 1 tablet (0.4 mg total) under the tongue every 5 (five) minutes as needed for chest pain. 04/04/15  Yes Rhonda G Barrett, PA-C  Omega-3 Fatty Acids (FISH OIL) 1200 MG CAPS Take 1 capsule (1,200 mg total) by mouth daily. 02/13/15  Yes Herminio Commons, MD  potassium chloride (K-DUR) 10 MEQ tablet Take 1 tablet (10 mEq total) by mouth as needed (on days that you take your Lasix). 02/13/15  Yes Herminio Commons, MD  rOPINIRole (REQUIP) 0.5 MG tablet Take 0.5 mg by  mouth at bedtime.  03/05/14  Yes Historical Provider, MD  sulfamethoxazole-trimethoprim (BACTRIM DS,SEPTRA DS) 800-160 MG tablet Take 1 tablet by mouth 2 (two) times daily. 07/03/15   Estill Dooms, NP   BP 131/77 mmHg  Pulse 122  Temp(Src) 97.7 F (36.5 C) (Oral)  Resp 20  Ht 5\' 3"  (1.6 m)  Wt 154 lb (69.854 kg)  BMI 27.29 kg/m2  SpO2 97% Physical Exam  Nursing note and vitals reviewed.  70 year old female, resting comfortably and in no acute distress. Vital signs are significant for tachycardia. Oxygen saturation is 97%, which is normal. Head is normocephalic and atraumatic. PERRLA, EOMI. Oropharynx is clear. Neck is nontender and supple without adenopathy or JVD. Back is nontender and there is no CVA tenderness. Lungs are clear without rales, wheezes, or rhonchi. Chest is nontender. Heart is tachycardic and irregular without murmur. Abdomen is soft, flat, nontender without masses or hepatosplenomegaly and peristalsis is normoactive. Extremities have no cyanosis or edema, full range of motion is present. Skin is warm and dry without rash. Neurologic: Mental status is normal, cranial nerves are intact, there are no motor or sensory deficits.  ED Course  Procedures (including critical care time) Labs Review Results for orders placed or performed during the hospital encounter of [id number]  Basic metabolic panel  Result Value Ref Range   Sodium 139 135 -  145 mmol/L   Potassium 3.8 3.5 - 5.1 mmol/L   Chloride 102 101 - 111 mmol/L   CO2 24 22 - 32 mmol/L   Glucose, Bld 304 (H) 65 - 99 mg/dL   BUN 17 6 - 20 mg/dL   Creatinine, Ser 1.00 0.44 - 1.00 mg/dL   Calcium 9.7 8.9 - 10.3 mg/dL   GFR calc non Af Amer 56 (L) >60 mL/min   GFR calc Af Amer >60 >60 mL/min   Anion gap 13 5 - 15  Troponin I  Result Value Ref Range   Troponin I <0.03 <0.031 ng/mL  CBC with Differential  Result Value Ref Range   WBC 11.2 (H) 4.0 - 10.5 K/uL   RBC 4.07 3.87 - 5.11 MIL/uL   Hemoglobin 12.5 12.0 - 15.0 g/dL   HCT 36.8 36.0 - 46.0 %   MCV 90.4 78.0 - 100.0 fL   MCH 30.7 26.0 - 34.0 pg   MCHC 34.0 30.0 - 36.0 g/dL   RDW 13.8 11.5 - 15.5 %   Platelets 227 150 - 400 K/uL   Neutrophils Relative % 71 %   Neutro Abs 7.9 (H) 1.7 - 7.7 K/uL   Lymphocytes Relative 21 %   Lymphs Abs 2.4 0.7 - 4.0 K/uL   Monocytes Relative 6 %   Monocytes Absolute 0.7 0.1 - 1.0 K/uL   Eosinophils Relative 2 %   Eosinophils Absolute 0.2 0.0 - 0.7 K/uL   Basophils Relative 0 %   Basophils Absolute 0.1 0.0 - 0.1 K/uL  Protime-INR  Result Value Ref Range   Prothrombin Time 13.1 11.6 - 15.2 seconds   INR 0.97 0.00 - 1.49  APTT  Result Value Ref Range   aPTT 32 24 - 37 seconds  I-stat troponin, ED  Result Value Ref Range   Troponin i, poc 0.02 0.00 - 0.08 ng/mL   Comment 3           Imaging Review Dg Chest Port 1 View  08/02/2015  CLINICAL DATA:  Acute onset of generalized chest pain. Initial encounter. EXAM: PORTABLE CHEST 1 VIEW COMPARISON:  Chest  radiograph performed 11/29/2014 FINDINGS: The lungs are well-aerated. There is elevation of the right hemidiaphragm, with mild bibasilar atelectasis. Mild vascular congestion is seen. There is no evidence of pleural effusion or pneumothorax. The cardiomediastinal silhouette is borderline enlarged. No acute osseous abnormalities are seen. IMPRESSION: Mild vascular congestion and borderline cardiomegaly. Elevation of the right  hemidiaphragm, with mild bibasilar atelectasis. Electronically Signed   By: Garald Balding M.D.   On: 08/02/2015 01:31   I have personally reviewed and evaluated these images and lab results as part of my medical decision-making.   EKG Interpretation   Date/Time:  Friday August 02 2015 01:04:39 EST Ventricular Rate:  129 PR Interval:  86 QRS Duration: 92 QT Interval:  362 QTC Calculation: 530 R Axis:   3 Text Interpretation:  Atrial fibrillation with rapid ventricular response  Abnormal R-wave progression, late transition LVH with secondary  repolarization abnormality ST depr, consider ischemia, inferior leads  Prolonged QT interval Baseline wander in lead(s) V1 When compared with ECG  of 04/04/2015, Atrial fibrillation Inferior injury pattern suggests right  ventricular involvement, recommend adding leads V3r and V4r to confirm has  replaced Sinus rhythm QT has lengthened Reconfirmed by [facility]  MD, Dajahnae Vondra  ([id number]) on 08/02/2015 1:14:52 AM      CRITICAL CARE Performed by: [id number] Total critical care time: 40 minutes Critical care time was exclusive of separately billable procedures and treating other patients. Critical care was necessary to treat or prevent imminent or life-threatening deterioration. Critical care was time spent personally by me on the following activities: development of treatment plan with patient and/or surrogate as well as nursing, discussions with consultants, evaluation of patient's response to treatment, examination of patient, obtaining history from patient or surrogate, ordering and performing treatments and interventions, ordering and review of laboratory studies, ordering and review of radiographic studies, pulse oximetry and re-evaluation of patient's condition.  MDM   Final diagnoses:  Chest pain, unspecified chest pain type  Atrial fibrillation with rapid ventricular response (HCC)    Chest pain worrisome for angina or myocardial infarction.  New-onset atrial fibrillation. Patient is not aware of her rapid heartbeat so time she has been in atrial fibrillation is uncertain. Old records are reviewed and she was seen in her cardiologist's office 1 week ago and had irregular rhythm with rate of 63. She had a stent placed last October and had no other myocardium at risk. She is given additional aspirin instead placed on heparin. She'll be given diltiazem for rate control. Because of uncertainty of time of onset of atrial fibrillation, she is not a candidate for immediate cardioversion.  Pain improved somewhat with morphine and she is given additional doses. Heart rate has come down with diltiazem. Case is discussed with Dr. Darrick Meigs of [facility] who agrees to admit the patient.  Delora Fuel, MD [id number]

## 2015-08-02 NOTE — Progress Notes (Signed)
Site area: right groin  Site Prior to Removal:  Level 0  Pressure Applied For 20 MINUTES    Minutes Beginning at 20:57  Manual:   Yes.    Patient Status During Pull:  WNL  Post Pull Groin Site:  Level 0  Post Pull Instructions Given:  Yes.    Post Pull Pulses Present:  Yes.    Dressing Applied:  Yes.    Comments:

## 2015-08-02 NOTE — Progress Notes (Signed)
ANTICOAGULATION CONSULT NOTE - Initial Consult  Pharmacy Consult for Heparin Indication: chest pain/ACS and atrial fibrillation  Allergies  Allergen Reactions  . Penicillins Hives    Has patient had a PCN reaction causing immediate rash, facial/tongue/throat swelling, SOB or lightheadedness with hypotension: No Has patient had a PCN reaction causing severe rash involving mucus membranes or skin necrosis: No Has patient had a PCN reaction that required hospitalization No Has patient had a PCN reaction occurring within the last 10 years: No If all of the above answers are "NO", then may proceed with Cephalosporin use.  [name] Percocet [Oxycodone-Acetaminophen] Nausea And Vomiting   Patient Measurements: Height: 5\' 3"  (160 cm) Weight: 154 lb (69.854 kg) IBW/kg (Calculated) : 52.4 HEPARIN DW (KG): 66.8  Vital Signs: Temp: 97.7 F (36.5 C) (02/10 0106) Temp Source: Oral (02/10 0106) BP: 128/81 mmHg (02/10 0130) Pulse Rate: 124 (02/10 0130)  Labs:  Recent Labs  08/02/15 0108  HGB 12.5  HCT 36.8  PLT 227  CREATININE 1.00  TROPONINI <0.03    Estimated Creatinine Clearance: 49.1 mL/min (by C-G formula based on Cr of 1).   Medical History: Past Medical History  Diagnosis Date  . HTN (hypertension)   . Heart block   . Hypothyroidism   . Ovarian tumor   . Coronary artery disease 11/30/2014    PCI to the OM1  . UTI (urinary tract infection) 05/08/2013  . Superficial fungus infection of skin 06/29/2013  . TIA (transient ischemic attack) 08/2001; ~ 2006  . GERD (gastroesophageal reflux disease)   . Arthritis   . Hypercholesteremia   . Anginal pain ([place])   . Myocardial infarction ([place]) 02/2012  . NSTEMI (non-ST elevated myocardial infarction) ([place]) 04/02/2015    3.0x18 mm Xience DES to the CFX  . Pneumonia 02/2012  . Type II diabetes mellitus ([place])   . Cutaneous lupus erythematosus   . History of blood transfusion 1980's    2nd surgical procedures  . Progressive angina ([place])  11/2014    PTCA w/ scoring balloon to the OM1  . Pain with urination 05/08/2015    Medications:   (Not in a hospital admission)  Home meds reviewed.  Assessment: Okay for Protocol.  No bleeding complications noted per RN.  Goal of Therapy:  Heparin level 0.3-0.7 units/ml Monitor platelets by anticoagulation protocol: Yes   Plan:  Give 3000 units bolus x 1 Start heparin infusion at 900 units/hr Check anti-Xa level in 6-8 hours and daily while on heparin Continue to monitor H&H and platelets  Biagio Quint R 08/02/2015,1:54 AM

## 2015-08-02 NOTE — H&P (Signed)
PCP:   Purvis Kilts, MD   Chief Complaint:  Chest pain  HPI:  70 yr old  female who  has a past medical history of HTN (hypertension); Heart block; Hypothyroidism; Ovarian tumor; Coronary artery disease (11/30/2014); UTI (urinary tract infection) (05/08/2013); Superficial fungus infection of skin (06/29/2013); TIA (transient ischemic attack) (08/2001; ~ 2006); GERD (gastroesophageal reflux disease); Arthritis; Hypercholesteremia; Anginal pain ([place]); Myocardial infarction [facility]) (02/2012); NSTEMI (non-ST elevated myocardial infarction) ([place]) (04/02/2015); Pneumonia (02/2012); Type II diabetes mellitus ([place]); Cutaneous lupus erythematosus; History of blood transfusion (1980's); Progressive angina ([place]) (11/2014); and Pain with urination (05/08/2015). Patient underwent stent placement for 90% proximal to mid circumflex lesion in October 2016. Today came to the hospital for chest pain at midnight while patient was about to sleep. Pain has been constant, denies shortness of breath. Had nausea and diaphoresis. Denies palpitations. Did not pass out. Patient takes aspirin and Plavix at home. She also took nitroglycerin without much relief. In the ED patient was found to be in A. fib with RVR, started on Cardizem drip along with IV heparin. Cardiac enzymes are negative. Patient has ongoing 6/10 intensity chest pain, which transiently improved after she received dose of nitroglycerin.  I called and discussed with cardiology on call Dr. Percival Spanish at [facility], who recommended to keep the patient at [place]. And transfer in case she has positive cardiac enzymes. Allergies:   Allergies  Allergen Reactions  . Penicillins Hives      . Percocet [Oxycodone-Acetaminophen]       Past Medical History  Diagnosis Date  . HTN (hypertension)   . Heart block   . Hypothyroidism   . Ovarian tumor   . Coronary artery disease 11/30/2014    PCI to the OM1  . UTI (urinary tract infection)  05/08/2013  . Superficial fungus infection of skin 06/29/2013  . TIA (transient ischemic attack) 08/2001; ~ 2006  . GERD (gastroesophageal reflux disease)   . Arthritis   . Hypercholesteremia   . Anginal pain ([place])   . Myocardial infarction ([place]) 02/2012  . NSTEMI (non-ST elevated myocardial infarction) ([place]) 04/02/2015    3.0x18 mm Xience DES to the CFX  . Pneumonia 02/2012  . Type II diabetes mellitus ([place])   . Cutaneous lupus erythematosus   . History of blood transfusion 1980's    2nd surgical procedures  . Progressive angina ([place]) 11/2014    PTCA w/ scoring balloon to the OM1  . Pain with urination 05/08/2015    Past Surgical History  Procedure Laterality Date  . 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 Partial hysterectomy  1970's    left ovaries, then ovaries removed later due tumors   . Left oophorectomy  05/1979    nicked bowel, peritonitis, colostomy; colostomy reversed 1981   . Nuclear med stress test  10/2011    Small area of mild ischemia inferoapically.  . Abdominal aortagram N/A 01/03/2014    Procedure: ABDOMINAL [name];  Surgeon: Wellington Hampshire, MD;  Location: [facility] CATH LAB;  Service: Cardiovascular;  Laterality: N/A;  . Lower extremity angiogram N/A 01/03/2014    Procedure: LOWER EXTREMITY ANGIOGRAM;  Surgeon: Wellington Hampshire, MD;  Location: [facility] CATH LAB;  Service: Cardiovascular;  Laterality: N/A;  . Colostomy reversal  11/1979  . Colonoscopy  2005    Dr. Laural Golden: pancolonic divericula, polyp, path unknown currently  . Appendectomy  1970's  . Cholecystectomy open  1990's  . Excisional hemorrhoidectomy  1970's  . Abdominal hysterectomy  1972    "  partial"  . 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 Eye surgery Left ~ 2001    "smoothed out wrinkle"  . Cardiac catheterization  2008    Tiny OM-2 with 90% narrowing. Med tx.  . Cardiac catheterization N/A 11/30/2014    Procedure: Left Heart Cath and Coronary Angiography;  Surgeon: Troy Sine, MD; LAD 20%, CFX 50%, OM1  95%, right PLB 30%, LV normal   . Cardiac catheterization N/A 11/30/2014    Procedure: Coronary Balloon Angioplasty;  Surgeon: Troy Sine, MD;  Angiosculpt scoring balloon and PTCA to the OM1 reducing stenosis from 95% to less than 10%  . Right oophorectomy  1970's  . Cardiac catheterization N/A 04/03/2015    Procedure: Left Heart Cath and Coronary Angiography;  Surgeon: Jolaine Artist, MD; dLAD 50%, CFX 90%, OM1 100%, PLA 15%, LVEDP 13    . Cardiac catheterization N/A 04/03/2015    Procedure: Coronary Stent Intervention;  Surgeon: Sherren Mocha, MD; 3.0x18 mm Xience DES to the CFX      Prior to Admission medications   Medication Sig Start Date End Date Taking? Authorizing Provider  amLODipine (NORVASC) 10 MG tablet TAKE ONE (1) TABLET EACH DAY 07/26/15  Yes Herminio Commons, MD  aspirin EC 81 MG tablet Take 1 tablet (81 mg total) by mouth daily. 01/30/14  Yes Wellington Hampshire, MD  atorvastatin (LIPITOR) 10 MG tablet Take 1 tablet (10 mg total) by mouth daily at 6 PM. 04/04/15  Yes Rhonda G Barrett, PA-C  cholecalciferol (VITAMIN D) 1000 UNITS tablet Take 1,000 Units by mouth at bedtime.    Yes Historical Provider, MD  clopidogrel (PLAVIX) 75 MG tablet Take 1 tablet (75 mg total) by mouth daily. 02/13/15  Yes Herminio Commons, MD  furosemide (LASIX) 20 MG tablet Take 1 tablet (20 mg total) by mouth as needed for edema (leg swelling). 02/13/15  Yes Herminio Commons, MD  insulin NPH Human (HUMULIN N,NOVOLIN N) 100 UNIT/ML injection Inject 45 Units into the skin 2 (two) times daily before a meal.    Yes Historical Provider, MD  labetalol (NORMODYNE) 300 MG tablet Take 1 tablet (300 mg total) by mouth 2 (two) times daily. 05/02/15  Yes Herminio Commons, MD  levothyroxine (SYNTHROID, LEVOTHROID) 137 MCG tablet Take 137 mcg by mouth daily before breakfast.   Yes Historical Provider, MD  losartan (COZAAR) 100 MG tablet TAKE ONE (1) TABLET BY MOUTH EVERY DAY 06/10/15  Yes Herminio Commons, MD  metFORMIN (GLUCOPHAGE) 500 MG tablet Take 500 mg by mouth 3 (three) times daily with meals.    Yes Historical Provider, MD  nitroGLYCERIN (NITROSTAT) 0.4 MG SL tablet Place 1 tablet (0.4 mg total) under the tongue every 5 (five) minutes as needed for chest pain. 04/04/15  Yes Rhonda G Barrett, PA-C  Omega-3 Fatty Acids (FISH OIL) 1200 MG CAPS Take 1 capsule (1,200 mg total) by mouth daily. 02/13/15  Yes Herminio Commons, MD  potassium chloride (K-DUR) 10 MEQ tablet Take 1 tablet (10 mEq total) by mouth as needed (on days that you take your Lasix). 02/13/15  Yes Herminio Commons, MD  rOPINIRole (REQUIP) 0.5 MG tablet Take 0.5 mg by mouth at bedtime.  03/05/14  Yes Historical Provider, MD  sulfamethoxazole-trimethoprim (BACTRIM DS,SEPTRA DS) 800-160 MG tablet Take 1 tablet by mouth 2 (two) times daily. 07/03/15   Estill Dooms, NP    Social History:  reports that she has never smoked.  She has never used smokeless tobacco. She reports that she does not drink alcohol or use illicit drugs.  Family History  Problem Relation Age of Onset  . Heart disease Mother     deceased  . Heart disease Father     deceased, heart disease  . Colon cancer Neg Hx   . Diabetes Brother   . Heart disease Brother   . Thyroid disease Brother   . Heart disease Sister   . Heart disease Brother   . Thyroid disease Brother   . Lupus Daughter     Danley Danker Weights   08/02/15 0106  Weight: 69.854 kg (154 lb)    All the positives are listed in BOLD  Review of Systems:  HEENT: Headache, blurred vision, runny nose, sore throat Neck: Hypothyroidism, hyperthyroidism,,lymphadenopathy Chest : Shortness of breath, history of COPD, Asthma Heart : Chest pain, history of coronary arterey disease GI:  Nausea, vomiting, diarrhea, constipation, GERD GU: Dysuria, urgency, frequency of urination, hematuria Neuro: Stroke, seizures, syncope Psych: Depression, anxiety, hallucinations   Physical  Exam: Blood pressure 130/89, pulse 126, temperature 97.7 F (36.5 C), temperature source Oral, resp. rate 13, height 5\' 3"  (1.6 m), weight 69.854 kg (154 lb), SpO2 96 %. Constitutional:   Patient is a well-developed and well-nourished female in no acute distress and cooperative with exam. Head: Normocephalic and atraumatic Mouth: Mucus membranes moist Eyes: PERRL, EOMI, conjunctivae normal Neck: Supple, No Thyromegaly Cardiovascular: RRR, S1 normal, S2 normal Pulmonary/Chest: CTAB, no wheezes, rales, or rhonchi Abdominal: Soft. Non-tender, non-distended, bowel sounds are normal, no masses, organomegaly, or guarding present.  Neurological: A&O x3, Strength is normal and symmetric bilaterally, cranial nerve II-XII are grossly intact, no focal motor deficit, sensory intact to light touch bilaterally.  Extremities : No Cyanosis, Clubbing, trace edema of the lower extremities  Labs on Admission:  Basic Metabolic Panel:  Recent Labs Lab 08/02/15 0108  NA 139  K 3.8  CL 102  CO2 24  GLUCOSE 304*  BUN 17  CREATININE 1.00  CALCIUM 9.7   CBC:  Recent Labs Lab 08/02/15 0108  WBC 11.2*  NEUTROABS 7.9*  HGB 12.5  HCT 36.8  MCV 90.4  PLT 227   Cardiac Enzymes:  Recent Labs Lab 08/02/15 0108  TROPONINI <0.03     Radiological Exams on Admission: Dg Chest Port 1 View  08/02/2015  CLINICAL DATA:  Acute onset of generalized chest pain. Initial encounter. EXAM: PORTABLE CHEST 1 VIEW COMPARISON:  Chest radiograph performed 11/29/2014 FINDINGS: The lungs are well-aerated. There is elevation of the right hemidiaphragm, with mild bibasilar atelectasis. Mild vascular congestion is seen. There is no evidence of pleural effusion or pneumothorax. The cardiomediastinal silhouette is borderline enlarged. No acute osseous abnormalities are seen. IMPRESSION: Mild vascular congestion and borderline cardiomegaly. Elevation of the right hemidiaphragm, with mild bibasilar atelectasis. Electronically  Signed   By: Garald Balding M.D.   On: 08/02/2015 01:31    EKG: Independently reviewed. Atrial fibrillation with rapid response   Assessment/Plan Active Problems:   Essential hypertension   Paroxysmal atrial fibrillation (HCC)   DM (diabetes mellitus), type 2, uncontrolled (HCC)   CAD (coronary artery disease), native coronary artery   Chest pain   Chest pain We'll admit the patient to stepdown unit, she has been started on heparin infusion per pharmacy. Will start Nitropaste 1 inch every 6 hours. Continue aspirin and Plavix. Cycle the cardiac enzymes.  Atrial fibrillation with RVR CHADS2Vasc score is 6 We'll continue Cardizem infusion, anticoagulation started with  heparin Consult cardiology in a.m.  Diabetes mellitus Start the patient on sliding-scale insulin with NovoLog, continue NPH insulin.  Hypertension Blood pressure is controlled, will hold antihypertensive medications at this time. As patient is currently on Cardizem.  Hypothyroidism Continue Synthroid   Code status: Full code   Family discussion: Admission, patients condition and plan of care including tests being ordered have been discussed with the patient and her husband at bedside who indicate understanding and agree with the plan and Code Status.   Time Spent on Admission: 60 min  [facility] Pager: [phone] 08/02/2015, 2:34 AM  If 7PM-7AM, please contact night-coverage  [email or web address]  Password [email or web address]

## 2015-08-03 ENCOUNTER — Other Ambulatory Visit: Payer: Self-pay

## 2015-08-03 DIAGNOSIS — I480 Paroxysmal atrial fibrillation: Secondary | ICD-10-CM | POA: Diagnosis not present

## 2015-08-03 DIAGNOSIS — I10 Essential (primary) hypertension: Secondary | ICD-10-CM | POA: Diagnosis not present

## 2015-08-03 DIAGNOSIS — E1165 Type 2 diabetes mellitus with hyperglycemia: Secondary | ICD-10-CM | POA: Diagnosis not present

## 2015-08-03 DIAGNOSIS — I214 Non-ST elevation (NSTEMI) myocardial infarction: Secondary | ICD-10-CM | POA: Diagnosis not present

## 2015-08-03 LAB — CBC
HEMATOCRIT: 35.4 % — AB (ref 36.0–46.0)
HEMOGLOBIN: 11.4 g/dL — AB (ref 12.0–15.0)
MCH: 29.1 pg (ref 26.0–34.0)
MCHC: 32.2 g/dL (ref 30.0–36.0)
MCV: 90.3 fL (ref 78.0–100.0)
Platelets: 213 10*3/uL (ref 150–400)
RBC: 3.92 MIL/uL (ref 3.87–5.11)
RDW: 14 % (ref 11.5–15.5)
WBC: 11.6 10*3/uL — ABNORMAL HIGH (ref 4.0–10.5)

## 2015-08-03 LAB — BASIC METABOLIC PANEL
Anion gap: 13 (ref 5–15)
BUN: 14 mg/dL (ref 6–20)
CHLORIDE: 105 mmol/L (ref 101–111)
CO2: 21 mmol/L — AB (ref 22–32)
Calcium: 9.1 mg/dL (ref 8.9–10.3)
Creatinine, Ser: 0.99 mg/dL (ref 0.44–1.00)
GFR calc non Af Amer: 56 mL/min — ABNORMAL LOW (ref >60)
Glucose, Bld: 267 mg/dL — ABNORMAL HIGH (ref 65–99)
Potassium: 4.3 mmol/L (ref 3.5–5.1)
Sodium: 139 mmol/L (ref 135–145)

## 2015-08-03 LAB — HEMOGLOBIN A1C
Hgb A1c MFr Bld: 8.7 % — ABNORMAL HIGH (ref 4.8–5.6)
Mean Plasma Glucose: 203 mg/dL

## 2015-08-03 LAB — GLUCOSE, CAPILLARY: Glucose-Capillary: 245 mg/dL — ABNORMAL HIGH (ref 65–99)

## 2015-08-03 MED ORDER — TICAGRELOR 90 MG PO TABS: 90.0000 mg | ORAL_TABLET | Freq: Two times a day (BID) | ORAL | Status: DC

## 2015-08-03 MED ORDER — ATORVASTATIN CALCIUM 80 MG PO TABS: 80.0000 mg | ORAL_TABLET | Freq: Every day | ORAL | Status: DC

## 2015-08-03 MED ORDER — LOSARTAN POTASSIUM 50 MG PO TABS
100.0000 mg | ORAL_TABLET | Freq: Every day | ORAL | Status: DC
  Administered 2015-08-03: 100 mg via ORAL
  Filled 2015-08-03: qty 2

## 2015-08-03 MED ORDER — LABETALOL HCL 300 MG PO TABS
300.0000 mg | ORAL_TABLET | Freq: Two times a day (BID) | ORAL | Status: DC
  Administered 2015-08-03: 10:00:00 300 mg via ORAL
  Filled 2015-08-03 (×2): qty 1

## 2015-08-03 MED ORDER — AMLODIPINE BESYLATE 10 MG PO TABS
10.0000 mg | ORAL_TABLET | Freq: Every day | ORAL | Status: DC
  Administered 2015-08-03: 10 mg via ORAL
  Filled 2015-08-03: qty 1

## 2015-08-03 NOTE — Progress Notes (Signed)
Patient Name: Virginia Huber Date of Encounter: 08/03/2015  Pt. Profile Virginia Huber is a 70 y.o.female with known history of CAD, Hypertension, Hyperlipidemia, with PCI using DES of the proximal and mid circumflex lesion, on 04/03/2015 who transferred to [facility] from [place] with NSEMI.   SUBJECTIVE  Complains of SOB. No chest pain.   CURRENT MEDS . aspirin EC  81 mg Oral Daily  . atorvastatin  80 mg Oral q1800  . clopidogrel  75 mg Oral Daily  . diltiazem  120 mg Oral Daily  . insulin aspart  0-9 Units Subcutaneous TID WC  . insulin NPH Human  45 Units Subcutaneous BID AC  . levothyroxine  137 mcg Oral QAC breakfast  . rOPINIRole  0.5 mg Oral QHS  . sodium chloride flush  3 mL Intravenous Q12H  . ticagrelor  90 mg Oral BID    OBJECTIVE  Filed Vitals:   08/02/15 2115 08/02/15 2120 08/02/15 2200 08/03/15 0357  BP: 152/56 136/82 153/34 165/58  Pulse: 68 70 73 72  Temp:    97.5 F (36.4 C)  TempSrc:    Oral  Resp: 20 23 16 22   Height:      Weight:    167 lb 15.9 oz (76.2 kg)  SpO2: 96% 98% 97% 95%    Intake/Output Summary (Last 24 hours) at 08/03/15 0734 Last data filed at 08/02/15 2300  Gross per 24 hour  Intake 6245.58 ml  Output    850 ml  Net 5395.58 ml   Filed Weights   08/02/15 0106 08/02/15 0323 08/03/15 0357  Weight: 154 lb (69.854 kg) 158 lb 8.2 oz (71.9 kg) 167 lb 15.9 oz (76.2 kg)    PHYSICAL EXAM  General: Pleasant, NAD. Neuro: Alert and oriented X 3. Moves all extremities spontaneously. Psych: Normal affect. HEENT:  Normal  Neck: Supple without bruits or JVD. Lungs:  Resp regular and unlabored, CTA. Heart: RRR no s3, s4, or murmurs. Abdomen: Soft, non-tender, non-distended, BS + x 4.  Extremities: No clubbing, cyanosis or edema. DP/PT/Radials 2+ and equal bilaterally. R groin cath site without hematoma.   Accessory Clinical Findings  CBC  Recent Labs  08/02/15 0108  08/02/15 0903 08/03/15 0318  WBC 11.2*  < > 7.6 11.6*  NEUTROABS 7.9*   --   --   --   HGB 12.5  < > 11.1* 11.4*  HCT 36.8  < > 33.2* 35.4*  MCV 90.4  < > 91.2 90.3  PLT 227  < > 192 213  < > = values in this interval not displayed. Basic Metabolic Panel  Recent Labs  08/02/15 0356 08/03/15 0318  NA 141 139  K 4.1 4.3  CL 106 105  CO2 25 21*  GLUCOSE 240* 267*  BUN 16 14  CREATININE 0.87 0.99  CALCIUM 9.0 9.1   Liver Function Tests  Recent Labs  08/02/15 0356  AST 106*  ALT 56*  ALKPHOS 74  BILITOT 0.4  PROT 5.7*  ALBUMIN 3.6   No results for input(s): LIPASE, AMYLASE in the last 72 hours. Cardiac Enzymes  Recent Labs  08/02/15 0356 08/02/15 0903 08/02/15 1643  TROPONINI 0.26* 2.09* 2.67*   BNP Invalid input(s): POCBNP D-Dimer No results for input(s): DDIMER in the last 72 hours. Hemoglobin A1C  Recent Labs  08/02/15 0108  HGBA1C 8.7*    TELE  NSR  Radiology/Studies  Dg Chest Port 1 View  08/02/2015  CLINICAL DATA:  Acute onset of generalized chest pain. Initial encounter.  EXAM: PORTABLE CHEST 1 VIEW COMPARISON:  Chest radiograph performed 11/29/2014 FINDINGS: The lungs are well-aerated. There is elevation of the right hemidiaphragm, with mild bibasilar atelectasis. Mild vascular congestion is seen. There is no evidence of pleural effusion or pneumothorax. The cardiomediastinal silhouette is borderline enlarged. No acute osseous abnormalities are seen. IMPRESSION: Mild vascular congestion and borderline cardiomegaly. Elevation of the right hemidiaphragm, with mild bibasilar atelectasis. Electronically Signed   By: Garald Balding M.D.   On: 08/02/2015 01:31    ASSESSMENT AND PLAN  1. NSTEMI (non-ST elevated myocardial infarction) ([place]) -s/p Successful percutaneous cardiac intervention to the distal RCA treated with Angiosculpt scoring balloon, PTCA, and ultimate stenting with a 2.58 mm Xience Alpine DES stent postdilated to 2.51 mm with a 90% stenosis reduced to 0% and no change in the ostial PDA narrowin. -Now on  Brillinta, Plavix and ASA. If anticoagulation is necessary for afib, Brilinta will need to be discontinued and changed back to Plavix due to potential bleed risk if triple therapy is needed short-term. MD to review. Will D/C plavix for now.  - Complains of severe SOB. Likely side effect of brillinta. Given Coffee however no improvement.   2. Atrial fibrillation with rapid ventricular response ([place])  - She converted to NSR in ER on diltiazem gtt. CHDAS VASC Score 5. Anticoagulation per MD.  - Seems she had brief episode of afib during last MI as well.   3. Essential hypertension - Elevated this morning. Will resume home meds and discontinue  cardizem CD 120 that was started at [facility].   4. DM (diabetes mellitus), type 2, uncontrolled (HCC) - A1c of 8.7. F/u with PCP  CAD (coronary artery disease), native coronary artery   Signed, Bhagat,Bhavinkumar PA-C Pager [phone]  I have seen and examined the patient along with Bhagat,Bhavinkumar PA-C.  I have reviewed the chart, notes and new data.  I agree with PA's note.  Key new complaints: breathless from Brilinta - a side effect she has had before. BP high - did not get her usual BP meds yesterday Key examination changes: no hematoma at right groin site or right radial failed access site, no wheezes or rales Key new findings / data: troponin seems to have peaked at 2.7. No further PAF.  PLAN: DC home on ASA + Brilinta (she was able to tolerate the breathless sensation for 3 months last time). After 30 days, switch to clopidogrel + Eliquis long term. Early f/u.  Sanda Klein, MD, [place] [phone] 08/03/2015, 9:28 AM

## 2015-08-03 NOTE — Progress Notes (Signed)
CARDIAC REHAB PHASE I   PRE:  Rate/Rhythm: 78 SR  BP:  Sitting: 183/58      SaO2: 95 RA  MODE:  Ambulation: 700 ft   POST:  Rate/Rhythm: 68 SR  BP:  Sitting: 179/58     SaO2: 95 RA [id number] Patient ambulated in hallway independently. Steady gait noted. Complained of shortness of breath that she related to [place]. Her lowest o2 sat during ambulation on RA was 93. BP elevated, but she has not received BP medication since admission. Will be restarted today. States she had the same SOB the last time she was on Brillinta, and was then switched to Plavix. Discharge education completed. Patient verbalizes understanding. She is somewhat discouraged because each time she graduates from card rehab, she has had a MI within a week. She is unsure if she will do cardiac rehab for a 3rd time, but request that order be placed in order to give her time to decide. Order will be placed for out patient card rehab at [facility].  Santina Evans, BSN 08/03/2015 8:56 AM

## 2015-08-03 NOTE — Discharge Summary (Signed)
Discharge Summary    Patient ID: Virginia Huber,  MRN: [id number], DOB/AGE: [birth year] 70 y.o.  Admit date: 08/02/2015 Discharge date: 08/03/2015  Primary Care Provider: Sharilyn Sites CABOT Primary Cardiologist: Kate Sable MD  Discharge Diagnoses    Principal Problem:   NSTEMI (non-ST elevated myocardial infarction) [facility]) Active Problems:   Essential hypertension   Paroxysmal atrial fibrillation ([place])   DM (diabetes mellitus), type 2, uncontrolled ([place])   CAD (coronary artery disease), native coronary artery   Chest pain   Atrial fibrillation with rapid ventricular response (HCC)   Hypothyroidism   Allergies Penicillins - Hives  Diagnostic Studies/Procedures   Coronary Stent Intervention    Left Heart Cath and Coronary Angiography    Conclusion     1st RPLB lesion, 50% stenosed.  Dist RCA lesion, 30% stenosed.  Ost 1st Mrg to 1st Mrg lesion, 100% stenosed.  Ost LAD lesion, 40% stenosed.  Mid LAD lesion, 40% stenosed.  Post Atrio lesion, 90% stenosed. Post intervention, there is a 0% residual stenosis.  The left ventricular systolic function is normal.  Mid RCA lesion, 20% stenosed.  Normal LV function without focal segmental wall motion abnormalities and ejection fraction of 55%.  Significant coronary obstructive disease with diffuse mild luminal narrowing of the LAD with 40% proximal and mid stenoses; widely patent stent in the proximal circumflex with old occlusion of the marginal branch which had arisen in the region of the stented segment but with evidence for very faint collateralization to this distal marginal vessel from the the LAD; a large dominant RCA with 20%, mild mid narrowing, 30% narrowing after the acute margin and focal 90% stenosis distally prior to a PDA 2 vessel with 50% narrowing at the ostium of this PDA prior to a bend in the vessel with the RCA ending in the PLA vessel.  Successful percutaneous cardiac intervention to the  distal RCA treated with Angiosculpt scoring balloon, PTCA, and ultimate stenting with a 2.58 mm Xience Alpine DES stent postdilated to 2.51 mm with a 90% stenosis reduced to 0% and no change in the ostial PDA narrowing.  RECOMMENDATION: Since the patient suffered a non-ST segment elevation myocardial infarction on Plavix, Brilinta was administered for oral antiplatelet therapy. It was felt most likely the patient's atrial fibrillation was ischemic mediated. If anticoagulation is necessary, Brilinta will need to be discontinued and changed back to Plavix due to potential bleed risk if triple therapy is needed short-term.       History of Present Illness     [name] is a 70 y.o.female with known history of CAD, Hypertension, Hyperlipidemia, with PCI using DES of the proximal and mid circumflex lesio  on 04/03/2015 who transferred 08/02/15 to [facility] from [place] with NSEMI.   She was in her usual state of health when she was awakened by chest pressure/heart burn. She states that they had eaten late that evening and thought it was related to that. She states she took a tums, but the chest burning and pain increased. She took one NTG and got clammy and very nauseated. She states that this was exactly how she felt with her last MI, in October, to include brief episode of atrial fib.  Of noted, she graduated from cardiac rehab 4 days ago.   She presented to the [facility] ER on 08/02/2015 with complaints of chest pain. On arrival BP 131/77 HR 122, O2 Sat 97%. Initial troponin 0.02, with repeat troponin 0.26. Glucose of 304, WBC 11.2, CXR mild vascular  congestion and borderline cardiomegaly. EKG, demonstrated atrial fivb with RVR, rate of 129 bpm with inferior lateral ST depression. She was treated with ASA, diltiazem IV bolus, and started on gtt. She was given IV heparin and started on a gtt, and treated also with morphine.   She converted to NSR in ER on diltiazem gtt and chest pain was immediately  relieved. She feels tired and continues to have low grade nausea. She was seen by Dr.Koneswaran and transferred for repeat cardiac cath with ischemic changes per EKG which have progressed since initial EKG from EKG demonstrating conversion to NSR. Troponin of 2.09.    Hospital Course     Consultants: None  The patient was taken directly to cath lab for NSTMI. S/p Successful percutaneous cardiac intervention to the distal RCA treated with Angiosculpt scoring balloon, PTCA, and ultimate stenting with a 2.58 mm Xience Alpine DES stent postdilated to 2.51 mm with a 90% stenosis reduced to 0% and no change in the ostial PDA narrowin. Her plavix was stopped and stated on Brilinta. However, had SOB as it was previously. Peak of troponin was 2.67. Her BP was elevated post op due to held of home antihypertensive medication. It was improved after initiation of home regimen.  Hold metformin 48 hours post cath.   The patient has been seen by Dr. Sallyanne Kuster  today and deemed ready for discharge home. All follow-up appointments have been scheduled. Discharge medications are listed below.   04/03/2015: Cholesterol 133; HDL 23*; LDL Cholesterol UNABLE TO CALCULATE IF TRIGLYCERIDE OVER 400 mg/dL; Triglycerides 617*; VLDL UNABLE TO CALCULATE IF TRIGLYCERIDE OVER 400 mg/dL Will need repeat lab during TCM.   Discharge home on ASA + Brilinta (she was able to tolerate the breathless sensation for 3 months last time). After 30 days, switch to clopidogrel + Eliquis long term.   Discharge Vitals Blood pressure 183/58, pulse 76, temperature 97.9 F (36.6 C), temperature source Oral, resp. rate 20, height 5\' 3"  (1.6 m), weight 167 lb 15.9 oz (76.2 kg), SpO2 97 %.  Filed Weights   08/02/15 0106 08/02/15 0323 08/03/15 0357  Weight: 154 lb (69.854 kg) 158 lb 8.2 oz (71.9 kg) 167 lb 15.9 oz (76.2 kg)    Labs & Radiologic Studies     CBC  Recent Labs  08/02/15 0108  08/02/15 0903 08/03/15 0318  WBC 11.2*  < > 7.6  11.6*  NEUTROABS 7.9*  --   --   --   HGB 12.5  < > 11.1* 11.4*  HCT 36.8  < > 33.2* 35.4*  MCV 90.4  < > 91.2 90.3  PLT 227  < > 192 213  < > = values in this interval not displayed. Basic Metabolic Panel  Recent Labs  08/02/15 0356 08/03/15 0318  NA 141 139  K 4.1 4.3  CL 106 105  CO2 25 21*  GLUCOSE 240* 267*  BUN 16 14  CREATININE 0.87 0.99  CALCIUM 9.0 9.1   Liver Function Tests  Recent Labs  08/02/15 0356  AST 106*  ALT 56*  ALKPHOS 74  BILITOT 0.4  PROT 5.7*  ALBUMIN 3.6   No results for input(s): LIPASE, AMYLASE in the last 72 hours. Cardiac Enzymes  Recent Labs  08/02/15 0356 08/02/15 0903 08/02/15 1643  TROPONINI 0.26* 2.09* 2.67*   Hemoglobin A1C  Recent Labs  08/02/15 0108  HGBA1C 8.7*     Dg Chest Port 1 View  08/02/2015  CLINICAL DATA:  Acute onset of generalized chest pain.  Initial encounter. EXAM: PORTABLE CHEST 1 VIEW COMPARISON:  Chest radiograph performed 11/29/2014 FINDINGS: The lungs are well-aerated. There is elevation of the right hemidiaphragm, with mild bibasilar atelectasis. Mild vascular congestion is seen. There is no evidence of pleural effusion or pneumothorax. The cardiomediastinal silhouette is borderline enlarged. No acute osseous abnormalities are seen. IMPRESSION: Mild vascular congestion and borderline cardiomegaly. Elevation of the right hemidiaphragm, with mild bibasilar atelectasis. Electronically Signed   By: Garald Balding M.D.   On: 08/02/2015 01:31    Disposition   Pt is being discharged home today in good condition.  Follow-up Plans & Appointments    Follow-up Information    Follow up with Purvis Kilts, MD. Schedule an appointment as soon as possible for a visit in 1 week.   Specialty:  Family Medicine   Why:  for DM A1c of 8.7. IT was 8.6 3 months ago.    Contact information:   [address] [place] [id number] [phone]       Follow up with Herminio Commons, MD.   Specialty:   Cardiology   Why:  office will call with appoinment   Contact information:   [place] [address] [phone]      Discharge Instructions    Amb Referral to Cardiac Rehabilitation    Complete by:  As directed   Diagnosis:   Myocardial Infarction PCI       Diet - low sodium heart healthy    Complete by:  As directed      Discharge instructions    Complete by:  As directed   NO HEAVY LIFTING (>10lbs) X 2 WEEKS. NO SEXUAL ACTIVITY X 2 WEEKS. NO DRIVING X 1 WEEK. NO SOAKING BATHS, HOT TUBS, POOLS, ETC., X 7 DAYS.  Hold metformin until 08/04/15. Resume 08/05/15.     Increase activity slowly    Complete by:  As directed            Discharge Medications   Current Discharge Medication List    START taking these medications   Details  ticagrelor (BRILINTA) 90 MG TABS tablet Take 1 tablet (90 mg total) by mouth 2 (two) times daily. Qty: 60 tablet, Refills: 11      CONTINUE these medications which have CHANGED   Details  atorvastatin (LIPITOR) 80 MG tablet Take 1 tablet (80 mg total) by mouth daily at 6 PM. Qty: 30 tablet, Refills: 6      CONTINUE these medications which have NOT CHANGED   Details  amLODipine (NORVASC) 10 MG tablet TAKE ONE (1) TABLET EACH DAY Qty: 90 tablet, Refills: 3    aspirin EC 81 MG tablet Take 1 tablet (81 mg total) by mouth daily.   Associated Diagnoses: PAD (peripheral artery disease) ([place]); Atherosclerosis of native coronary artery of native heart without angina pectoris    cholecalciferol (VITAMIN D) 1000 UNITS tablet Take 1,000 Units by mouth at bedtime.     furosemide (LASIX) 20 MG tablet Take 1 tablet (20 mg total) by mouth as needed for edema (leg swelling). Qty: 30 tablet, Refills: 3    insulin NPH Human (HUMULIN N,NOVOLIN N) 100 UNIT/ML injection Inject 45 Units into the skin 2 (two) times daily before a meal.     labetalol (NORMODYNE) 300 MG tablet Take 1 tablet (300 mg total) by mouth 2 (two) times daily. Qty: 180  tablet, Refills: 2    levothyroxine (SYNTHROID, LEVOTHROID) 137 MCG tablet Take 137 mcg by mouth daily before breakfast.  losartan (COZAAR) 100 MG tablet TAKE ONE (1) TABLET BY MOUTH EVERY DAY Qty: 90 tablet, Refills: 3    metFORMIN (GLUCOPHAGE) 500 MG tablet Take 500 mg by mouth 3 (three) times daily with meals.     nitroGLYCERIN (NITROSTAT) 0.4 MG SL tablet Place 1 tablet (0.4 mg total) under the tongue every 5 (five) minutes as needed for chest pain. Qty: 25 tablet, Refills: 12    Omega-3 Fatty Acids (FISH OIL) 1200 MG CAPS Take 1 capsule (1,200 mg total) by mouth daily.    potassium chloride (K-DUR) 10 MEQ tablet Take 1 tablet (10 mEq total) by mouth as needed (on days that you take your Lasix). Qty: 30 tablet, Refills: 3    rOPINIRole (REQUIP) 0.5 MG tablet Take 0.5 mg by mouth at bedtime.  Refills: 0      STOP taking these medications     clopidogrel (PLAVIX) 75 MG tablet          Aspirin prescribed at discharge?  Yes High Intensity Statin Prescribed? (Lipitor 40-80mg  or Crestor 20-40mg ): Yes Beta Blocker Prescribed? Yes For EF 45% or less, Was ACEI/ARB Prescribed? N/A. EF of 55% by cath. On ARB ADP Receptor Inhibitor Prescribed? (i.e. Plavix etc.-Includes Medically Managed Patients): Yes For EF <40%, Aldosterone Inhibitor Prescribed? N/A Was EF assessed during THIS hospitalization? Yes Was Cardiac Rehab II ordered? (Included Medically managed Patients): Yes   Outstanding Labs/Studies   Lipid panel  Duration of Discharge Encounter   Greater than 30 minutes including physician time.  Signed, Beautiful Pensyl PA-C 08/03/2015, 9:41 AM

## 2015-08-05 ENCOUNTER — Encounter (HOSPITAL_COMMUNITY): Payer: Self-pay | Admitting: Cardiovascular Disease

## 2015-08-05 LAB — GLUCOSE, CAPILLARY: Glucose-Capillary: 184 mg/dL — ABNORMAL HIGH (ref 65–99)

## 2015-08-05 MED FILL — Verapamil HCl IV Soln 2.5 MG/ML: INTRAVENOUS | Qty: 2 | Status: AC

## 2015-08-06 DIAGNOSIS — E119 Type 2 diabetes mellitus without complications: Secondary | ICD-10-CM | POA: Diagnosis not present

## 2015-08-07 ENCOUNTER — Encounter: Payer: Self-pay | Admitting: Physician Assistant

## 2015-08-08 DIAGNOSIS — I10 Essential (primary) hypertension: Secondary | ICD-10-CM | POA: Diagnosis not present

## 2015-08-08 DIAGNOSIS — E039 Hypothyroidism, unspecified: Secondary | ICD-10-CM | POA: Diagnosis not present

## 2015-08-08 DIAGNOSIS — E7800 Pure hypercholesterolemia, unspecified: Secondary | ICD-10-CM | POA: Diagnosis not present

## 2015-08-08 DIAGNOSIS — E1165 Type 2 diabetes mellitus with hyperglycemia: Secondary | ICD-10-CM | POA: Diagnosis not present

## 2015-08-08 NOTE — Progress Notes (Signed)
Cardiology Office Note Date:  08/09/2015  Patient ID:  [name], [name] 06/02/1946, MRN [id number] PCP:  Purvis Kilts, MD  Cardiologist:  Bronson Ing   Chief Complaint: f/u NSTEMI  History of Present Illness: Virginia Huber is a 70 y.o. female with history of CAD (remote MI; sp PTCA with scoring balloon to OM1 11/2014, NSTEMI 03/2015 s/p DES to prox-mid Cx, recent NSTEMI 07/2015 s/p scoring balloon/PTCA/DES to dRCA with PAF during that admission), remote history of transient atrial fib/flutter in 2013 during admission for PNA, LE PAD (followed expectantly by Dr. Fletcher Anon), HTN, HLD, TIAs, DM, heart block (?details - listed in [place] by [place] in 2012, no mention of this from cardiology notes), B12 deficiency anemia, hypothyroidism, who presents for post-hospital f/u.   She was admitted 07/2015 with chest pain and ruled in for NSTEMI. She was also found to be in rapid atrial fib and was treated with IV dilitazem and heparin. She converted to NSR in the ER on diltiazem drip. She underwent cath showing diffuse CAD (see report) with subsequent PTCA/DES to distal RCA, EF 55%. Plavix was stopped and she was changed to Brilinta with plans to switch to Plavix + Eliquis after 30 days long term. Labs notable for Hgb 11.4 (c/w prior), uncontrolled DM A1C 8.7, AST 106, ALT 56, peak troponin 2.67. CHADSVASC = 7.   She presents back to follow-up overall doing well. She occasionally feels "weaker" but thinks this may be due to multiple medication changes recently. She saw her PCP yesterday and insulin was adjusted. No falls, chest pain, SOB, dizziness, pre-syncope or syncope. No bleeding reported. She spoke with Diane at cardiac rehab who told her her insurance may not cover cardiac rehab for another round, so she plans to increase her activity on her own. She says she remains committed to staying active and eating right.   Past Medical History  Diagnosis Date  . HTN (hypertension)   . Heart block   .  Hypothyroidism   . Ovarian tumor   . Coronary artery disease 11/30/2014    a. remote MI. b. h/o PTCA with scoring balloon to OM1 11/2014. c. NSTEMI 03/2015 s/p DES to prox-mid Cx. d. NSTEMI 07/2015 s/p scoring balloon/PTCA/DES to dRCA with PAF during that admission  . UTI (urinary tract infection) 05/08/2013  . Superficial fungus infection of skin 06/29/2013  . TIA (transient ischemic attack) 08/2001; ~ 2006  . GERD (gastroesophageal reflux disease)   . Arthritis   . Hypercholesteremia   . Myocardial infarction ([place]) 02/2012  . Type II diabetes mellitus ([place])   . Cutaneous lupus erythematosus   . History of blood transfusion 1980's    2nd surgical procedures  . Pain with urination 05/08/2015  . Atrial fibrillation and flutter ([place])     a. h/o PAF/flutter during admission in 2013 for PNA. b. PAF during adm for NSTEMI 07/2015.  [name] PAD (peripheral artery disease) ([place])     a. s/p LE angio 2015; followed by Dr. Fletcher Anon - managed medically.  . B12 deficiency anemia     Past Surgical History  Procedure Laterality Date  . 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 Partial hysterectomy  1970's    left ovaries, then ovaries removed later due tumors   . Left oophorectomy  05/1979    nicked bowel, peritonitis, colostomy; colostomy reversed 1981   . Nuclear med stress test  10/2011    Small area of mild ischemia inferoapically.  . Abdominal aortagram N/A 01/03/2014    Procedure:  ABDOMINAL AORTAGRAM;  Surgeon: Wellington Hampshire, MD;  Location: [facility] CATH LAB;  Service: Cardiovascular;  Laterality: N/A;  . Lower extremity angiogram N/A 01/03/2014    Procedure: LOWER EXTREMITY ANGIOGRAM;  Surgeon: Wellington Hampshire, MD;  Location: [facility] CATH LAB;  Service: Cardiovascular;  Laterality: N/A;  . Colostomy reversal  11/1979  . Colonoscopy  2005    Dr. Laural Golden: pancolonic divericula, polyp, path unknown currently  . Appendectomy  1970's  . Cholecystectomy open  1990's  . Excisional hemorrhoidectomy  1970's  . Abdominal hysterectomy  1972     "partial"  . 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 Eye surgery Left ~ 2001    "smoothed out wrinkle"  . Cardiac catheterization  2008    Tiny OM-2 with 90% narrowing. Med tx.  . Cardiac catheterization N/A 11/30/2014    Procedure: Left Heart Cath and Coronary Angiography;  Surgeon: Troy Sine, MD; LAD 20%, CFX 50%, OM1 95%, right PLB 30%, LV normal   . Cardiac catheterization N/A 11/30/2014    Procedure: Coronary Balloon Angioplasty;  Surgeon: Troy Sine, MD;  Angiosculpt scoring balloon and PTCA to the OM1 reducing stenosis from 95% to less than 10%  . Right oophorectomy  1970's  . Cardiac catheterization N/A 04/03/2015    Procedure: Left Heart Cath and Coronary Angiography;  Surgeon: Jolaine Artist, MD; dLAD 50%, CFX 90%, OM1 100%, PLA 15%, LVEDP 13    . Cardiac catheterization N/A 04/03/2015    Procedure: Coronary Stent Intervention;  Surgeon: Sherren Mocha, MD; 3.0x18 mm Xience DES to the CFX    . Cardiac catheterization N/A 08/02/2015    Procedure: Left Heart Cath and Coronary Angiography;  Surgeon: Troy Sine, MD;  Location: [place] [facility] CV LAB;  Service: Cardiovascular;  Laterality: N/A;  . Cardiac catheterization N/A 08/02/2015    Procedure: Coronary Stent Intervention;  Surgeon: Troy Sine, MD;  Location: [facility] CV LAB;  Service: Cardiovascular;  Laterality: N/A;    Current Outpatient Prescriptions  Medication Sig Dispense Refill  . amLODipine (NORVASC) 10 MG tablet TAKE ONE (1) TABLET EACH DAY 90 tablet 3  . aspirin EC 81 MG tablet Take 1 tablet (81 mg total) by mouth daily.    [name] atorvastatin (LIPITOR) 80 MG tablet Take 1 tablet (80 mg total) by mouth daily at 6 PM. 30 tablet 6  . cholecalciferol (VITAMIN D) 1000 UNITS tablet Take 1,000 Units by mouth at bedtime.     . furosemide (LASIX) 20 MG tablet Take 1 tablet (20 mg total) by mouth as needed for edema (leg swelling). 30 tablet 3  . insulin NPH Human (HUMULIN N,NOVOLIN N) 100 UNIT/ML  injection Inject 100 Units into the skin 2 (two) times daily before a meal.     . labetalol (NORMODYNE) 300 MG tablet Take 1 tablet (300 mg total) by mouth 2 (two) times daily. 180 tablet 2  . levothyroxine (SYNTHROID, LEVOTHROID) 137 MCG tablet Take 137 mcg by mouth daily before breakfast.    . losartan (COZAAR) 100 MG tablet TAKE ONE (1) TABLET BY MOUTH EVERY DAY 90 tablet 3  . metFORMIN (GLUCOPHAGE) 500 MG tablet Take 500 mg by mouth 3 (three) times daily with meals.     . nitroGLYCERIN (NITROSTAT) 0.4 MG SL tablet Place 1 tablet (0.4 mg total) under the tongue every 5 (five) minutes as needed for chest pain. 25 tablet 12  . Omega-3 Fatty Acids (FISH OIL) 1200 MG CAPS Take 1  capsule (1,200 mg total) by mouth daily.    . potassium chloride (K-DUR) 10 MEQ tablet Take 1 tablet (10 mEq total) by mouth as needed (on days that you take your Lasix). 30 tablet 3  . rOPINIRole (REQUIP) 0.5 MG tablet Take 0.5 mg by mouth at bedtime.   0  . ticagrelor (BRILINTA) 90 MG TABS tablet Take 1 tablet (90 mg total) by mouth 2 (two) times daily. 60 tablet 11   No current facility-administered medications for this visit.    Allergies:   Penicillins and Percocet   Social History:  The patient  reports that she has never smoked. She has never used smokeless tobacco. She reports that she does not drink alcohol or use illicit drugs.   Family History:  The patient's family history includes Diabetes in her brother; Heart disease in her brother, brother, father, mother, and sister; Lupus in her daughter; Thyroid disease in her brother and brother. There is no history of Colon cancer.  ROS:  Please see the history of present illness.  All other systems are reviewed and otherwise negative.   PHYSICAL EXAM:  VS:  BP 110/62 mmHg  Pulse 81  Ht 5\' 3"  (1.6 m)  Wt 152 lb (68.947 kg)  BMI 26.93 kg/m2  SpO2 99% BMI: Body mass index is 26.93 kg/(m^2). Well nourished, well developed WF, in no acute distress HEENT:  normocephalic, atraumatic. Strabismus noted Neck: no JVD, carotid bruits or masses Cardiac:  normal S1, S2; RRR; no murmurs, rubs, or gallops Lungs:  clear to auscultation bilaterally, no wheezing, rhonchi or rales Abd: soft, nontender, no hepatomegaly, + BS MS: no deformity or atrophy Ext: no edema. Right groin cath site without hematoma, ecchymosis, or bruit. Skin: warm and dry, no rash Neuro:  moves all extremities spontaneously, no focal abnormalities noted, follows commands Psych: euthymic mood, full affect  EKG:  Done today shows NSR 63bpm, nonsepcific St-T changes with diffuse TWI I, avL, II, III, avF, V4-V6. Grossly c/w prior.  Recent Labs: 08/02/2015: ALT 56* 08/03/2015: BUN 14; Creatinine, Ser 0.99; Hemoglobin 11.4*; Platelets 213; Potassium 4.3; Sodium 139  04/03/2015: Cholesterol 133; HDL 23*; LDL Cholesterol UNABLE TO CALCULATE IF TRIGLYCERIDE OVER 400 mg/dL; Total CHOL/HDL Ratio 5.8; Triglycerides 617*; VLDL UNABLE TO CALCULATE IF TRIGLYCERIDE OVER 400 mg/dL   Estimated Creatinine Clearance: 49.2 mL/min (by C-G formula based on Cr of 0.99).   Wt Readings from Last 3 Encounters:  08/09/15 152 lb (68.947 kg)  08/03/15 167 lb 15.9 oz (76.2 kg)  07/26/15 153 lb (69.4 kg)     Other studies reviewed: Additional studies/records reviewed today include: summarized above  ASSESSMENT AND PLAN:  1. CAD with recent NSTEMI - doing well post-PCI. Per plan, will continue ASA + Briilinta for 30 days (last dose 09/01/15) then switch to Plavix + Eliquis (first dose 09/02/15). She plans to recreate what she's previously learned in cardiac rehab with gradual increase of physical activity at home.  2. Paroxysmal atrial fib - maintaining NSR. Since we are repeating her LFTs today, will also check thyroid function and Mg since these were not recently obtained. Per records she had an episode in 2013 during PNA, again with prior MI, and now most recently during recent NSTEMI as well. Given CHADSVASC  score of 7 I agree anticoagulation is warranted. I discussed the indications, risks, and benefits with the patient today who is agreeable. I reviewed the timetable suggested in the hospital for switching with Dr. Bronson Ing who agrees. See above regarding swap to  Plavix/Eliquis to begin 09/02/15. She qualifies for Eliquis 5mg  BID dose. Will check CBC at next OV to ensure stability.  3. Transaminitis - recheck CMET today. Given statin titration, will recheck in 6 weeks with lipid panel. 4. Essential HTN - controlled on present regimen. 5. Hyperlipidemia - she says her PCP was questioning why she was changed from Lipitor 10 to 80mg . I explained the rationale. Her husband noted she's had some joint aches in the past but that doesn't seem to be a big issue at present time. The patient requests to stay on present dose of 80mg  and will let us know if this becomes a problem. I would not be opposed to lowering to 40mg  daily if she develops myalgias. F/u CMET/lipids as above.  Disposition: F/u with Dr. Bronson Ing in 6 weeks.  Current medicines are reviewed at length with the patient today.  The patient did not have any concerns regarding medicines. Did not charge TOC visit because no phone call.  Signed, Melina Copa PA-C 08/09/2015 2:11 PM     [place] Location [address] [place], [place] [address] [phone]

## 2015-08-09 ENCOUNTER — Ambulatory Visit (INDEPENDENT_AMBULATORY_CARE_PROVIDER_SITE_OTHER): Payer: PPO | Admitting: Physician Assistant

## 2015-08-09 ENCOUNTER — Encounter: Payer: Self-pay | Admitting: Physician Assistant

## 2015-08-09 VITALS — BP 110/62 | HR 81 | Ht 63.0 in | Wt 152.0 lb

## 2015-08-09 DIAGNOSIS — E663 Overweight: Secondary | ICD-10-CM | POA: Diagnosis not present

## 2015-08-09 DIAGNOSIS — I214 Non-ST elevation (NSTEMI) myocardial infarction: Secondary | ICD-10-CM

## 2015-08-09 DIAGNOSIS — E119 Type 2 diabetes mellitus without complications: Secondary | ICD-10-CM | POA: Diagnosis not present

## 2015-08-09 DIAGNOSIS — I2510 Atherosclerotic heart disease of native coronary artery without angina pectoris: Secondary | ICD-10-CM | POA: Diagnosis not present

## 2015-08-09 DIAGNOSIS — Z1389 Encounter for screening for other disorder: Secondary | ICD-10-CM | POA: Diagnosis not present

## 2015-08-09 DIAGNOSIS — Z6827 Body mass index (BMI) 27.0-27.9, adult: Secondary | ICD-10-CM | POA: Diagnosis not present

## 2015-08-09 DIAGNOSIS — I10 Essential (primary) hypertension: Secondary | ICD-10-CM

## 2015-08-09 DIAGNOSIS — I480 Paroxysmal atrial fibrillation: Secondary | ICD-10-CM

## 2015-08-09 DIAGNOSIS — R740 Nonspecific elevation of levels of transaminase and lactic acid dehydrogenase [LDH]: Secondary | ICD-10-CM

## 2015-08-09 DIAGNOSIS — E785 Hyperlipidemia, unspecified: Secondary | ICD-10-CM

## 2015-08-09 DIAGNOSIS — I4891 Unspecified atrial fibrillation: Secondary | ICD-10-CM | POA: Diagnosis not present

## 2015-08-09 DIAGNOSIS — R7401 Elevation of levels of liver transaminase levels: Secondary | ICD-10-CM

## 2015-08-09 MED ORDER — APIXABAN 5 MG PO TABS: 5.0000 mg | ORAL_TABLET | Freq: Two times a day (BID) | ORAL | Status: DC

## 2015-08-09 MED ORDER — CLOPIDOGREL BISULFATE 75 MG PO TABS: 75.0000 mg | ORAL_TABLET | Freq: Every day | ORAL | Status: DC

## 2015-08-09 NOTE — Patient Instructions (Signed)
Medication Instructions:  Take aspirin & brilinta until 09/01/15 On 09/02/15 START TAKING ELIQUIS 5 MG TWO TIMES DAILY & PLAVIX 75 MG DAILY  Labwork: Your physician recommends that you return for lab work in: TODAY CMET TSH FREE T 4  MAGNESIUM   Your physician recommends that you return for lab work in:6 WEEKS  CMET LIPID PANEL CBC    Testing/Procedures: NONE  Follow-Up: Your physician recommends that you schedule a follow-up appointment in: [place] DR. Bronson Ing   Any Other Special Instructions Will Be Listed Below (If Applicable).     If you need a refill on your cardiac medications before your next appointment, please call your pharmacy.

## 2015-08-10 LAB — COMPREHENSIVE METABOLIC PANEL
ALK PHOS: 69 U/L (ref 33–130)
ALT: 16 U/L (ref 6–29)
AST: 10 U/L (ref 10–35)
Albumin: 4.1 g/dL (ref 3.6–5.1)
BILIRUBIN TOTAL: 0.4 mg/dL (ref 0.2–1.2)
BUN: 18 mg/dL (ref 7–25)
CO2: 21 mmol/L (ref 20–31)
Calcium: 9.1 mg/dL (ref 8.6–10.4)
Chloride: 108 mmol/L (ref 98–110)
Creat: 1.03 mg/dL — ABNORMAL HIGH (ref 0.60–0.93)
GLUCOSE: 92 mg/dL (ref 65–99)
POTASSIUM: 4.4 mmol/L (ref 3.5–5.3)
Sodium: 140 mmol/L (ref 135–146)
Total Protein: 6.1 g/dL (ref 6.1–8.1)

## 2015-08-10 LAB — MAGNESIUM: MAGNESIUM: 1.7 mg/dL (ref 1.5–2.5)

## 2015-08-10 LAB — TSH: TSH: 6.37 mIU/L — ABNORMAL HIGH

## 2015-08-10 LAB — T4, FREE: FREE T4: 1.5 ng/dL (ref 0.8–1.8)

## 2015-08-12 ENCOUNTER — Telehealth: Payer: Self-pay

## 2015-08-12 DIAGNOSIS — Z79899 Other long term (current) drug therapy: Secondary | ICD-10-CM

## 2015-08-12 NOTE — Telephone Encounter (Signed)
Pt notified, she voiced understanding. She agrees to discuss TSH with her primary doctor & also repeat lab in one week.

## 2015-08-12 NOTE — Telephone Encounter (Signed)
-----   Message from Bernita Raisin, RN sent at 08/12/2015  8:46 AM EST -----   ----- Message -----    From: Charlie Pitter, PA-C    Sent: 08/12/2015   7:56 AM      To: Bernita Raisin, RN  Please call patient. Labs are stable except: - magnesium remains a little low - given her atrial fib, would like it closer to 2. Start MagOx 400mg  daily and recheck in 1 week. Also increase dietary intake of healthy sources of magnesium including leafy greens, nuts, seeds, fish, beans, whole grains, avocados, yogurt, and bananas. - f/u PCP for elevated TSH. Note free T4 is normal so they may just want to recheck this in a couple weeks.  Dayna Dunn PA-C

## 2015-08-19 DIAGNOSIS — Z79899 Other long term (current) drug therapy: Secondary | ICD-10-CM | POA: Diagnosis not present

## 2015-08-19 LAB — MAGNESIUM: Magnesium: 1.9 mg/dL (ref 1.5–2.5)

## 2015-08-19 NOTE — Progress Notes (Signed)
Patient is discharged from [facility] program today, 07/29/15 with 36 sessions.  She achieved LTG of 30 minutes of aerobic exercise at max met level of 3.50.  Discharge instructions have been reviewed in detail and patient expressed an understanding of material given.  Patient plans to exercise at home. Cardiac Rehab will make 1 month, 6 month and 1 year call backs.  Patient had no complaints of any abnormal S/S or pain on their exit visit.  Patient finished post walk test.

## 2015-08-19 NOTE — Progress Notes (Signed)
Cardiac Rehabilitation Program Outcomes Report   Orientation:  04/23/15 Graduate Date:  07/29/15 Discharge Date:  07/29/15 # of sessions completed: 36 (2nd time in program)  Cardiologist: Gaynelle Cage MD:  Bethann Berkshire Time:  0930  A.  Exercise Program:  Tolerates exercise @ 3.50 METS for 15 minutes, Walk Test Results:  Post: 2.82, Improved functional capacity  13.64 %, No Change  muscular strength  0 % and Improved  flexibility 5.36 %  B.  Mental Health:  Good mental attitude, Quality of Life (QOL)  improvements:  Overall  -2.19 %, Health/Functioning 3.39 %, Socioeconomics -7.69 %, Psych/Spiritual 0 %, Family -12.5 %   and PHQ-9: 2  C.  Education/Instruction/Skills  Accurately checks own pulse.  Rest:  77  Exercise:  97, Knows THR for exercise and Uses Perceived Exertion Scale and/or Dyspnea Scale  Demonstrates accurate pursed lip breathing  Patient attended all education classes during the first time she was in program one month prior to starting this time.  D.  Nutrition/Weight Control/Body Composition:  Adherence to prescribed nutrition program: good to fair   E.  Blood Lipids    Lab Results  Component Value Date   CHOL 133 04/03/2015   HDL 23* 04/03/2015   LDLCALC UNABLE TO CALCULATE IF TRIGLYCERIDE OVER 400 mg/dL 04/03/2015   TRIG 617* 04/03/2015   CHOLHDL 5.8 04/03/2015    F.  Lifestyle Changes:  Making positive lifestyle changes  G.  Symptoms noted with exercise:  Hypoglycemic-self treated. No emergency needed  Report Completed By:  Stevphen Rochester RN   Comments:  This is the patients graduation note for [facility].  Patient has done well in the program and plans to exercise at home for follow up.

## 2015-08-19 NOTE — Addendum Note (Signed)
Encounter addended by: Cathie Olden, RN on: 08/19/2015  3:11 PM<BR>     Documentation filed: Notes Section

## 2015-08-22 ENCOUNTER — Telehealth: Payer: Self-pay | Admitting: Cardiovascular Disease

## 2015-08-22 DIAGNOSIS — I10 Essential (primary) hypertension: Secondary | ICD-10-CM | POA: Diagnosis not present

## 2015-08-22 DIAGNOSIS — E7800 Pure hypercholesterolemia, unspecified: Secondary | ICD-10-CM | POA: Diagnosis not present

## 2015-08-22 DIAGNOSIS — E039 Hypothyroidism, unspecified: Secondary | ICD-10-CM | POA: Diagnosis not present

## 2015-08-22 DIAGNOSIS — E1165 Type 2 diabetes mellitus with hyperglycemia: Secondary | ICD-10-CM | POA: Diagnosis not present

## 2015-08-22 NOTE — Telephone Encounter (Signed)
Lab work results / tg

## 2015-08-22 NOTE — Telephone Encounter (Signed)
Pt notified magnesium 1.9, states she will continue to take magnesium daily

## 2015-09-16 ENCOUNTER — Encounter (HOSPITAL_COMMUNITY): Payer: Self-pay | Admitting: Emergency Medicine

## 2015-09-16 ENCOUNTER — Emergency Department (HOSPITAL_COMMUNITY): Payer: PPO

## 2015-09-16 ENCOUNTER — Inpatient Hospital Stay (HOSPITAL_COMMUNITY)
Admission: EM | Admit: 2015-09-16 | Discharge: 2015-09-18 | DRG: 309 | Disposition: A | Discharge status: Home / Self Care | Payer: PPO | Attending: Internal Medicine | Admitting: Internal Medicine

## 2015-09-16 DIAGNOSIS — Z833 Family history of diabetes mellitus: Secondary | ICD-10-CM | POA: Diagnosis not present

## 2015-09-16 DIAGNOSIS — I480 Paroxysmal atrial fibrillation: Secondary | ICD-10-CM | POA: Diagnosis not present

## 2015-09-16 DIAGNOSIS — I4891 Unspecified atrial fibrillation: Secondary | ICD-10-CM | POA: Diagnosis present

## 2015-09-16 DIAGNOSIS — I2510 Atherosclerotic heart disease of native coronary artery without angina pectoris: Secondary | ICD-10-CM | POA: Diagnosis present

## 2015-09-16 DIAGNOSIS — I248 Other forms of acute ischemic heart disease: Secondary | ICD-10-CM | POA: Diagnosis not present

## 2015-09-16 DIAGNOSIS — Z7901 Long term (current) use of anticoagulants: Secondary | ICD-10-CM | POA: Diagnosis not present

## 2015-09-16 DIAGNOSIS — K219 Gastro-esophageal reflux disease without esophagitis: Secondary | ICD-10-CM | POA: Diagnosis present

## 2015-09-16 DIAGNOSIS — I10 Essential (primary) hypertension: Secondary | ICD-10-CM | POA: Diagnosis not present

## 2015-09-16 DIAGNOSIS — Z90710 Acquired absence of both cervix and uterus: Secondary | ICD-10-CM | POA: Diagnosis not present

## 2015-09-16 DIAGNOSIS — R079 Chest pain, unspecified: Secondary | ICD-10-CM | POA: Diagnosis present

## 2015-09-16 DIAGNOSIS — Z955 Presence of coronary angioplasty implant and graft: Secondary | ICD-10-CM | POA: Diagnosis not present

## 2015-09-16 DIAGNOSIS — I4819 Other persistent atrial fibrillation: Secondary | ICD-10-CM | POA: Diagnosis present

## 2015-09-16 DIAGNOSIS — I252 Old myocardial infarction: Secondary | ICD-10-CM

## 2015-09-16 DIAGNOSIS — Z79899 Other long term (current) drug therapy: Secondary | ICD-10-CM

## 2015-09-16 DIAGNOSIS — E7800 Pure hypercholesterolemia, unspecified: Secondary | ICD-10-CM | POA: Diagnosis present

## 2015-09-16 DIAGNOSIS — Z8673 Personal history of transient ischemic attack (TIA), and cerebral infarction without residual deficits: Secondary | ICD-10-CM | POA: Diagnosis not present

## 2015-09-16 DIAGNOSIS — E785 Hyperlipidemia, unspecified: Secondary | ICD-10-CM | POA: Diagnosis present

## 2015-09-16 DIAGNOSIS — Z7902 Long term (current) use of antithrombotics/antiplatelets: Secondary | ICD-10-CM | POA: Diagnosis not present

## 2015-09-16 DIAGNOSIS — R0602 Shortness of breath: Secondary | ICD-10-CM | POA: Diagnosis not present

## 2015-09-16 DIAGNOSIS — Z794 Long term (current) use of insulin: Secondary | ICD-10-CM

## 2015-09-16 DIAGNOSIS — Z933 Colostomy status: Secondary | ICD-10-CM | POA: Diagnosis not present

## 2015-09-16 DIAGNOSIS — I739 Peripheral vascular disease, unspecified: Secondary | ICD-10-CM | POA: Diagnosis present

## 2015-09-16 DIAGNOSIS — R309 Painful micturition, unspecified: Secondary | ICD-10-CM | POA: Diagnosis present

## 2015-09-16 DIAGNOSIS — E039 Hypothyroidism, unspecified: Secondary | ICD-10-CM | POA: Diagnosis present

## 2015-09-16 DIAGNOSIS — Z8249 Family history of ischemic heart disease and other diseases of the circulatory system: Secondary | ICD-10-CM | POA: Diagnosis not present

## 2015-09-16 DIAGNOSIS — E1122 Type 2 diabetes mellitus with diabetic chronic kidney disease: Secondary | ICD-10-CM | POA: Diagnosis not present

## 2015-09-16 DIAGNOSIS — E1159 Type 2 diabetes mellitus with other circulatory complications: Secondary | ICD-10-CM | POA: Diagnosis present

## 2015-09-16 DIAGNOSIS — E1165 Type 2 diabetes mellitus with hyperglycemia: Secondary | ICD-10-CM | POA: Diagnosis not present

## 2015-09-16 DIAGNOSIS — R002 Palpitations: Secondary | ICD-10-CM | POA: Diagnosis not present

## 2015-09-16 DIAGNOSIS — E782 Mixed hyperlipidemia: Secondary | ICD-10-CM | POA: Diagnosis present

## 2015-09-16 DIAGNOSIS — I25118 Atherosclerotic heart disease of native coronary artery with other forms of angina pectoris: Secondary | ICD-10-CM | POA: Diagnosis not present

## 2015-09-16 DIAGNOSIS — E1121 Type 2 diabetes mellitus with diabetic nephropathy: Secondary | ICD-10-CM | POA: Diagnosis present

## 2015-09-16 LAB — CBC WITH DIFFERENTIAL/PLATELET
Basophils Absolute: 0 10*3/uL (ref 0.0–0.1)
Basophils Relative: 0 %
Eosinophils Absolute: 0.2 10*3/uL (ref 0.0–0.7)
Eosinophils Relative: 2 %
HEMATOCRIT: 36.7 % (ref 36.0–46.0)
Hemoglobin: 12.2 g/dL (ref 12.0–15.0)
LYMPHS PCT: 15 %
Lymphs Abs: 1.4 10*3/uL (ref 0.7–4.0)
MCH: 30 pg (ref 26.0–34.0)
MCHC: 33.2 g/dL (ref 30.0–36.0)
MCV: 90.4 fL (ref 78.0–100.0)
MONO ABS: 0.6 10*3/uL (ref 0.1–1.0)
MONOS PCT: 6 %
NEUTROS ABS: 7.1 10*3/uL (ref 1.7–7.7)
Neutrophils Relative %: 77 %
Platelets: 230 10*3/uL (ref 150–400)
RBC: 4.06 MIL/uL (ref 3.87–5.11)
RDW: 14.1 % (ref 11.5–15.5)
WBC: 9.3 10*3/uL (ref 4.0–10.5)

## 2015-09-16 LAB — BASIC METABOLIC PANEL
Anion gap: 9 (ref 5–15)
BUN: 21 mg/dL — AB (ref 6–20)
CHLORIDE: 106 mmol/L (ref 101–111)
CO2: 23 mmol/L (ref 22–32)
CREATININE: 1 mg/dL (ref 0.44–1.00)
Calcium: 8.8 mg/dL — ABNORMAL LOW (ref 8.9–10.3)
GFR calc Af Amer: >60 mL/min (ref >60)
GFR calc non Af Amer: 56 mL/min — ABNORMAL LOW (ref >60)
Glucose, Bld: 347 mg/dL — ABNORMAL HIGH (ref 65–99)
Potassium: 4.2 mmol/L (ref 3.5–5.1)
SODIUM: 138 mmol/L (ref 135–145)

## 2015-09-16 LAB — TROPONIN I: Troponin I: <0.03 ng/mL (ref <0.031)

## 2015-09-16 LAB — BRAIN NATRIURETIC PEPTIDE: B Natriuretic Peptide: 123 pg/mL — ABNORMAL HIGH (ref 0.0–100.0)

## 2015-09-16 MED ORDER — DILTIAZEM LOAD VIA INFUSION
10.0000 mg | Freq: Once | INTRAVENOUS | Status: AC
  Administered 2015-09-16: 10 mg via INTRAVENOUS
  Filled 2015-09-16: qty 10

## 2015-09-16 MED ORDER — MORPHINE SULFATE (PF) 4 MG/ML IV SOLN
4.0000 mg | Freq: Once | INTRAVENOUS | Status: AC
  Administered 2015-09-16: 4 mg via INTRAVENOUS
  Filled 2015-09-16: qty 1

## 2015-09-16 MED ORDER — NITROGLYCERIN 2 % TD OINT
1.0000 [in_us] | TOPICAL_OINTMENT | Freq: Once | TRANSDERMAL | Status: AC
  Administered 2015-09-16: 1 [in_us] via TOPICAL
  Filled 2015-09-16: qty 1

## 2015-09-16 MED ORDER — ASPIRIN 325 MG PO TABS
325.0000 mg | ORAL_TABLET | Freq: Once | ORAL | Status: AC
  Administered 2015-09-16: 325 mg via ORAL
  Filled 2015-09-16: qty 1

## 2015-09-16 MED ORDER — DILTIAZEM HCL 100 MG IV SOLR
INTRAVENOUS | Status: AC
  Filled 2015-09-16: qty 100

## 2015-09-16 MED ORDER — DILTIAZEM HCL 100 MG IV SOLR
5.0000 mg/h | INTRAVENOUS | Status: DC
  Administered 2015-09-16: 5 mg/h via INTRAVENOUS
  Filled 2015-09-16: qty 100

## 2015-09-16 NOTE — ED Notes (Signed)
PT WHOSE CARIOLOGIST IS dR kELLY PRESENTS AFTER WATCHING TV AND FINDING SUDDEN ONSET OF CHEST PAIN- SHE DESCRIBES PAIN OF AT LEAST A 9/10 AND REPORTS THIS WAS LIKE HER LAST MI

## 2015-09-16 NOTE — ED Notes (Signed)
PHYSICIAN IN TO ASSESS

## 2015-09-16 NOTE — ED Provider Notes (Signed)
MSE was initiated and I personally evaluated the patient at 2220 and placed orders (if any) at  2224 on September 16, 2015.  Pt c/o sudden onset of chest "pressure," palpitations, SOB that began approximately 2100/2130 PTA. EKG afib/RVR, rate 149, NS STTW changes. Pt endorses hx of same symptoms last month "that started when I was just laying there getting ready to fall asleep;" states she received IV cardizem at that time with improvement in her symptoms. BP stable. Monitor afib/RVR, rate 130's. A&O, resps easy, speaking full sentences with ease, neuro non-focal. Labs, PCXR, IV cardizem bolus and gtt ordered.   Will continue to monitor closely until the remainder of the MSE can be completed by another provider.    Francine Graven, DO 09/17/15 0012

## 2015-09-16 NOTE — ED Notes (Signed)
Pt was in afib 6 weeks ago and stent place on blood thinners, tonight pt was sitting and had sudden chest pain, pressure and SOB

## 2015-09-17 DIAGNOSIS — I10 Essential (primary) hypertension: Secondary | ICD-10-CM | POA: Diagnosis not present

## 2015-09-17 DIAGNOSIS — I4891 Unspecified atrial fibrillation: Secondary | ICD-10-CM | POA: Diagnosis not present

## 2015-09-17 DIAGNOSIS — R079 Chest pain, unspecified: Secondary | ICD-10-CM | POA: Diagnosis not present

## 2015-09-17 DIAGNOSIS — K219 Gastro-esophageal reflux disease without esophagitis: Secondary | ICD-10-CM | POA: Diagnosis not present

## 2015-09-17 DIAGNOSIS — Z794 Long term (current) use of insulin: Secondary | ICD-10-CM

## 2015-09-17 DIAGNOSIS — I2510 Atherosclerotic heart disease of native coronary artery without angina pectoris: Secondary | ICD-10-CM | POA: Diagnosis not present

## 2015-09-17 DIAGNOSIS — E1165 Type 2 diabetes mellitus with hyperglycemia: Secondary | ICD-10-CM | POA: Diagnosis not present

## 2015-09-17 DIAGNOSIS — Z7901 Long term (current) use of anticoagulants: Secondary | ICD-10-CM | POA: Diagnosis not present

## 2015-09-17 DIAGNOSIS — Z955 Presence of coronary angioplasty implant and graft: Secondary | ICD-10-CM | POA: Diagnosis not present

## 2015-09-17 DIAGNOSIS — E7800 Pure hypercholesterolemia, unspecified: Secondary | ICD-10-CM | POA: Diagnosis not present

## 2015-09-17 DIAGNOSIS — I739 Peripheral vascular disease, unspecified: Secondary | ICD-10-CM | POA: Diagnosis not present

## 2015-09-17 DIAGNOSIS — Z833 Family history of diabetes mellitus: Secondary | ICD-10-CM | POA: Diagnosis not present

## 2015-09-17 DIAGNOSIS — Z90710 Acquired absence of both cervix and uterus: Secondary | ICD-10-CM | POA: Diagnosis not present

## 2015-09-17 DIAGNOSIS — E039 Hypothyroidism, unspecified: Secondary | ICD-10-CM | POA: Diagnosis not present

## 2015-09-17 DIAGNOSIS — I480 Paroxysmal atrial fibrillation: Principal | ICD-10-CM

## 2015-09-17 DIAGNOSIS — I4819 Other persistent atrial fibrillation: Secondary | ICD-10-CM | POA: Diagnosis present

## 2015-09-17 DIAGNOSIS — Z933 Colostomy status: Secondary | ICD-10-CM | POA: Diagnosis not present

## 2015-09-17 DIAGNOSIS — Z8673 Personal history of transient ischemic attack (TIA), and cerebral infarction without residual deficits: Secondary | ICD-10-CM | POA: Diagnosis not present

## 2015-09-17 DIAGNOSIS — R0602 Shortness of breath: Secondary | ICD-10-CM | POA: Diagnosis not present

## 2015-09-17 DIAGNOSIS — I25118 Atherosclerotic heart disease of native coronary artery with other forms of angina pectoris: Secondary | ICD-10-CM | POA: Diagnosis not present

## 2015-09-17 DIAGNOSIS — E785 Hyperlipidemia, unspecified: Secondary | ICD-10-CM | POA: Diagnosis not present

## 2015-09-17 DIAGNOSIS — Z79899 Other long term (current) drug therapy: Secondary | ICD-10-CM | POA: Diagnosis not present

## 2015-09-17 DIAGNOSIS — E1122 Type 2 diabetes mellitus with diabetic chronic kidney disease: Secondary | ICD-10-CM | POA: Diagnosis not present

## 2015-09-17 DIAGNOSIS — I252 Old myocardial infarction: Secondary | ICD-10-CM | POA: Diagnosis not present

## 2015-09-17 DIAGNOSIS — Z8249 Family history of ischemic heart disease and other diseases of the circulatory system: Secondary | ICD-10-CM | POA: Diagnosis not present

## 2015-09-17 DIAGNOSIS — R002 Palpitations: Secondary | ICD-10-CM | POA: Diagnosis not present

## 2015-09-17 DIAGNOSIS — I248 Other forms of acute ischemic heart disease: Secondary | ICD-10-CM | POA: Diagnosis not present

## 2015-09-17 DIAGNOSIS — Z7902 Long term (current) use of antithrombotics/antiplatelets: Secondary | ICD-10-CM | POA: Diagnosis not present

## 2015-09-17 LAB — URINALYSIS, ROUTINE W REFLEX MICROSCOPIC
BILIRUBIN URINE: NEGATIVE
HGB URINE DIPSTICK: NEGATIVE
Ketones, ur: NEGATIVE mg/dL
Leukocytes, UA: NEGATIVE
Nitrite: NEGATIVE
PROTEIN: NEGATIVE mg/dL
Specific Gravity, Urine: 1.01 (ref 1.005–1.030)
pH: 5 (ref 5.0–8.0)

## 2015-09-17 LAB — URINE MICROSCOPIC-ADD ON

## 2015-09-17 LAB — LIPID PANEL
Cholesterol: 91 mg/dL (ref 0–200)
HDL: 26 mg/dL — ABNORMAL LOW (ref >40)
LDL CALC: 16 mg/dL (ref 0–99)
Total CHOL/HDL Ratio: 3.5 RATIO
Triglycerides: 247 mg/dL — ABNORMAL HIGH (ref <150)
VLDL: 49 mg/dL — AB (ref 0–40)

## 2015-09-17 LAB — TROPONIN I
TROPONIN I: 0.13 ng/mL — AB (ref <0.031)
Troponin I: 0.44 ng/mL — ABNORMAL HIGH (ref <0.031)
Troponin I: 0.38 ng/mL — ABNORMAL HIGH (ref <0.031)
Troponin I: 0.33 ng/mL — ABNORMAL HIGH (ref <0.031)

## 2015-09-17 LAB — GLUCOSE, CAPILLARY
GLUCOSE-CAPILLARY: 247 mg/dL — AB (ref 65–99)
Glucose-Capillary: 298 mg/dL — ABNORMAL HIGH (ref 65–99)

## 2015-09-17 LAB — TSH: TSH: 0.263 u[IU]/mL — AB (ref 0.350–4.500)

## 2015-09-17 LAB — T4, FREE: FREE T4: 1.08 ng/dL (ref 0.61–1.12)

## 2015-09-17 LAB — CBG MONITORING, ED
Glucose-Capillary: 330 mg/dL — ABNORMAL HIGH (ref 65–99)
Glucose-Capillary: 341 mg/dL — ABNORMAL HIGH (ref 65–99)

## 2015-09-17 MED ORDER — LEVOTHYROXINE SODIUM 75 MCG PO TABS
150.0000 ug | ORAL_TABLET | Freq: Every day | ORAL | Status: DC
  Administered 2015-09-18: 150 ug via ORAL
  Filled 2015-09-17: qty 2

## 2015-09-17 MED ORDER — LEVOTHYROXINE SODIUM 75 MCG PO TABS
150.0000 ug | ORAL_TABLET | Freq: Every morning | ORAL | Status: DC
  Administered 2015-09-17: 150 ug via ORAL
  Filled 2015-09-17: qty 3

## 2015-09-17 MED ORDER — ROPINIROLE HCL 0.25 MG PO TABS
ORAL_TABLET | ORAL | Status: AC
  Filled 2015-09-17: qty 2

## 2015-09-17 MED ORDER — LABETALOL HCL 200 MG PO TABS
300.0000 mg | ORAL_TABLET | Freq: Two times a day (BID) | ORAL | Status: DC
  Administered 2015-09-17 – 2015-09-18 (×3): 300 mg via ORAL
  Filled 2015-09-17 (×3): qty 2

## 2015-09-17 MED ORDER — CLOPIDOGREL BISULFATE 75 MG PO TABS
75.0000 mg | ORAL_TABLET | Freq: Every day | ORAL | Status: DC
  Administered 2015-09-17 – 2015-09-18 (×2): 75 mg via ORAL
  Filled 2015-09-17 (×2): qty 1

## 2015-09-17 MED ORDER — INSULIN ASPART 100 UNIT/ML ~~LOC~~ SOLN
0.0000 [IU] | Freq: Three times a day (TID) | SUBCUTANEOUS | Status: DC
  Administered 2015-09-17: 5 [IU] via SUBCUTANEOUS
  Administered 2015-09-17 (×2): 7 [IU] via SUBCUTANEOUS
  Administered 2015-09-18: 5 [IU] via SUBCUTANEOUS
  Administered 2015-09-18: 3 [IU] via SUBCUTANEOUS
  Filled 2015-09-17 (×2): qty 1

## 2015-09-17 MED ORDER — MORPHINE SULFATE (PF) 4 MG/ML IV SOLN
4.0000 mg | Freq: Once | INTRAVENOUS | Status: AC
  Administered 2015-09-17: 4 mg via INTRAVENOUS
  Filled 2015-09-17: qty 1

## 2015-09-17 MED ORDER — FUROSEMIDE 20 MG PO TABS: 20.0000 mg | ORAL_TABLET | ORAL | Status: DC | PRN

## 2015-09-17 MED ORDER — ONDANSETRON HCL 4 MG/2ML IJ SOLN: 4.0000 mg | Freq: Four times a day (QID) | INTRAMUSCULAR | Status: DC | PRN

## 2015-09-17 MED ORDER — APIXABAN 5 MG PO TABS
5.0000 mg | ORAL_TABLET | Freq: Two times a day (BID) | ORAL | Status: DC
  Administered 2015-09-17 – 2015-09-18 (×3): 5 mg via ORAL
  Filled 2015-09-17 (×5): qty 1

## 2015-09-17 MED ORDER — INSULIN ASPART PROT & ASPART (70-30 MIX) 100 UNIT/ML ~~LOC~~ SUSP
45.0000 [IU] | Freq: Two times a day (BID) | SUBCUTANEOUS | Status: DC
  Administered 2015-09-17 – 2015-09-18 (×3): 45 [IU] via SUBCUTANEOUS
  Filled 2015-09-17 (×2): qty 10

## 2015-09-17 MED ORDER — ROPINIROLE HCL 0.25 MG PO TABS
0.5000 mg | ORAL_TABLET | Freq: Every day | ORAL | Status: DC
  Administered 2015-09-17: 0.5 mg via ORAL
  Filled 2015-09-17: qty 2
  Filled 2015-09-17: qty 1
  Filled 2015-09-17: qty 2

## 2015-09-17 MED ORDER — POTASSIUM CHLORIDE CRYS ER 10 MEQ PO TBCR
10.0000 meq | EXTENDED_RELEASE_TABLET | ORAL | Status: DC | PRN
  Filled 2015-09-17: qty 1

## 2015-09-17 MED ORDER — ATORVASTATIN CALCIUM 40 MG PO TABS
80.0000 mg | ORAL_TABLET | Freq: Every day | ORAL | Status: DC
  Administered 2015-09-17: 80 mg via ORAL
  Filled 2015-09-17 (×2): qty 2

## 2015-09-17 MED ORDER — METOPROLOL TARTRATE 1 MG/ML IV SOLN
5.0000 mg | Freq: Once | INTRAVENOUS | Status: AC
  Administered 2015-09-17: 5 mg via INTRAVENOUS
  Filled 2015-09-17: qty 5

## 2015-09-17 MED ORDER — ACETAMINOPHEN 325 MG PO TABS: 650.0000 mg | ORAL_TABLET | ORAL | Status: DC | PRN

## 2015-09-17 NOTE — ED Notes (Signed)
Placed on a bed pan. Told patient to call out when she was done.

## 2015-09-17 NOTE — ED Notes (Signed)
Resting quietly, with family nearby- denies pain, iv infusing, with even, unlabored respirations

## 2015-09-17 NOTE — H&P (Signed)
PCP:   Purvis Kilts, MD   Chief Complaint:  Palpitations and chest pain  HPI: 70 yo female h/o CAD stent last month, hypothroidism, iddm, pafib on xaralto comes in with palpitations since 9pm this evening with associated sscp that is typical for when her "heart goes out of wack".  She denies any n/v.  Some mild sob, no le edema or swelling.  No fevers.  Pt found to be in afib with rvr on arrival given cardizem drip and her chest pain has now resolved once she converted to NSR on cardizem gtt.  Pt says the only thing she has been experiencing differently is dysuria.  Pt referred for admission for her chest pain and uncontrolled heart rate.   Review of Systems:  Positive and negative as per HPI otherwise all other systems are negative  Past Medical History: Past Medical History  Diagnosis Date  . HTN (hypertension)   . Heart block   . Hypothyroidism   . Ovarian tumor   . Coronary artery disease 11/30/2014    a. remote MI. b. h/o PTCA with scoring balloon to OM1 11/2014. c. NSTEMI 03/2015 s/p DES to prox-mid Cx. d. NSTEMI 07/2015 s/p scoring balloon/PTCA/DES to dRCA with PAF during that admission  . UTI (urinary tract infection) 05/08/2013  . Superficial fungus infection of skin 06/29/2013  . TIA (transient ischemic attack) 08/2001; ~ 2006  . GERD (gastroesophageal reflux disease)   . Arthritis   . Hypercholesteremia   . Myocardial infarction ([place]) 02/2012  . Type II diabetes mellitus ([place])   . Cutaneous lupus erythematosus   . History of blood transfusion 1980's    2nd surgical procedures  . Pain with urination 05/08/2015  . Atrial fibrillation and flutter ([place])     a. h/o PAF/flutter during admission in 2013 for PNA. b. PAF during adm for NSTEMI 07/2015.  [name] PAD (peripheral artery disease) ([place])     a. s/p LE angio 2015; followed by Dr. Fletcher Anon - managed medically.  . B12 deficiency anemia    Past Surgical History  Procedure Laterality Date  . 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 Partial  hysterectomy  1970's    left ovaries, then ovaries removed later due tumors   . Left oophorectomy  05/1979    nicked bowel, peritonitis, colostomy; colostomy reversed 1981   . Nuclear med stress test  10/2011    Small area of mild ischemia inferoapically.  . Abdominal aortagram N/A 01/03/2014    Procedure: ABDOMINAL [name];  Surgeon: Wellington Hampshire, MD;  Location: [facility] CATH LAB;  Service: Cardiovascular;  Laterality: N/A;  . Lower extremity angiogram N/A 01/03/2014    Procedure: LOWER EXTREMITY ANGIOGRAM;  Surgeon: Wellington Hampshire, MD;  Location: [facility] CATH LAB;  Service: Cardiovascular;  Laterality: N/A;  . Colostomy reversal  11/1979  . Colonoscopy  2005    Dr. Laural Golden: pancolonic divericula, polyp, path unknown currently  . Appendectomy  1970's  . Cholecystectomy open  1990's  . Excisional hemorrhoidectomy  1970's  . Abdominal hysterectomy  1972    "partial"  . 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 Eye surgery Left ~ 2001    "smoothed out wrinkle"  . Cardiac catheterization  2008    Tiny OM-2 with 90% narrowing. Med tx.  . Cardiac catheterization N/A 11/30/2014    Procedure: Left Heart Cath and Coronary Angiography;  Surgeon: Troy Sine, MD; LAD 20%, CFX 50%, OM1 95%, right PLB 30%, LV normal   .  Cardiac catheterization N/A 11/30/2014    Procedure: Coronary Balloon Angioplasty;  Surgeon: Troy Sine, MD;  Angiosculpt scoring balloon and PTCA to the OM1 reducing stenosis from 95% to less than 10%  . Right oophorectomy  1970's  . Cardiac catheterization N/A 04/03/2015    Procedure: Left Heart Cath and Coronary Angiography;  Surgeon: Jolaine Artist, MD; dLAD 50%, CFX 90%, OM1 100%, PLA 15%, LVEDP 13    . Cardiac catheterization N/A 04/03/2015    Procedure: Coronary Stent Intervention;  Surgeon: Sherren Mocha, MD; 3.0x18 mm Xience DES to the CFX    . Cardiac catheterization N/A 08/02/2015    Procedure: Left Heart Cath and Coronary Angiography;  Surgeon: Troy Sine, MD;  Location: [facility] CV LAB;  Service: Cardiovascular;  Laterality: N/A;  . Cardiac catheterization N/A 08/02/2015    Procedure: Coronary Stent Intervention;  Surgeon: Troy Sine, MD;  Location: [facility] CV LAB;  Service: Cardiovascular;  Laterality: N/A;  . Cardiac stents      Medications: Prior to Admission medications   Medication Sig Start Date End Date Taking? Authorizing Provider  amLODipine (NORVASC) 10 MG tablet TAKE ONE (1) TABLET EACH DAY 07/26/15  Yes Herminio Commons, MD  apixaban (ELIQUIS) 5 MG TABS tablet Take 1 tablet (5 mg total) by mouth 2 (two) times daily. 08/09/15  Yes Dayna N Dunn, PA-C  atorvastatin (LIPITOR) 80 MG tablet Take 1 tablet (80 mg total) by mouth daily at 6 PM. 08/03/15  Yes Bhavinkumar Bhagat, PA  cholecalciferol (VITAMIN D) 1000 UNITS tablet Take 1,000 Units by mouth at bedtime.    Yes Historical Provider, MD  clopidogrel (PLAVIX) 75 MG tablet Take 1 tablet (75 mg total) by mouth daily. 08/09/15  Yes Dayna N Dunn, PA-C  furosemide (LASIX) 20 MG tablet Take 1 tablet (20 mg total) by mouth as needed for edema (leg swelling). 02/13/15  Yes Herminio Commons, MD  insulin NPH-regular Human (NOVOLIN 70/30) (70-30) 100 UNIT/ML injection Inject 45 Units into the skin 2 (two) times daily.   Yes Historical Provider, MD  labetalol (NORMODYNE) 300 MG tablet Take 1 tablet (300 mg total) by mouth 2 (two) times daily. 05/02/15  Yes Herminio Commons, MD  levothyroxine (SYNTHROID, LEVOTHROID) 150 MCG tablet Take 150 mcg by mouth every morning.   Yes Historical Provider, MD  losartan (COZAAR) 100 MG tablet TAKE ONE (1) TABLET BY MOUTH EVERY DAY 06/10/15  Yes Herminio Commons, MD  metFORMIN (GLUCOPHAGE) 500 MG tablet Take 500 mg by mouth 3 (three) times daily with meals.    Yes Historical Provider, MD  nitroGLYCERIN (NITROSTAT) 0.4 MG SL tablet Place 1 tablet (0.4 mg total) under the tongue every 5 (five) minutes as needed for chest pain. 04/04/15  Yes  Rhonda G Barrett, PA-C  Omega-3 Fatty Acids (FISH OIL) 1200 MG CAPS Take 1 capsule (1,200 mg total) by mouth daily. 02/13/15  Yes Herminio Commons, MD  potassium chloride (K-DUR) 10 MEQ tablet Take 1 tablet (10 mEq total) by mouth as needed (on days that you take your Lasix). 02/13/15  Yes Herminio Commons, MD  rOPINIRole (REQUIP) 0.5 MG tablet Take 0.5 mg by mouth at bedtime.  03/05/14  Yes Historical Provider, MD  ticagrelor (BRILINTA) 90 MG TABS tablet Take 1 tablet (90 mg total) by mouth 2 (two) times daily. Patient not taking: Reported on 09/16/2015 08/03/15   Leanor Kail, PA    Allergies:   Allergies  Allergen Reactions  . Penicillins  Hives    Has patient had a PCN reaction causing immediate rash, facial/tongue/throat swelling, SOB or lightheadedness with hypotension:  Has patient had a PCN reaction causing severe rash involving mucus membranes or skin necrosis:  Has patient had a PCN reaction that required hospitalization  Has patient had a PCN reaction occurring within the last 10 years:  If all of the above answers are "NO", then may proceed with Cephalosporin use.  [name] Percocet [Oxycodone-Acetaminophen] Nausea And Vomiting    Social History:  reports that she has never smoked. She has never used smokeless tobacco. She reports that she does not drink alcohol or use illicit drugs.  Family History: Family History  Problem Relation Age of Onset  . Heart disease Mother     deceased  . Heart disease Father     deceased, heart disease  . Colon cancer Neg Hx   . Diabetes Brother   . Heart disease Brother   . Thyroid disease Brother   . Heart disease Sister   . Heart disease Brother   . Thyroid disease Brother   . Lupus Daughter     Physical Exam: Filed Vitals:   09/17/15 0230 09/17/15 0300 09/17/15 0330 09/17/15 0400  BP: 123/70 120/52 121/49 126/55  Pulse: 127 61 58 63  Temp:      Resp: 30 20 21 15   Height:      Weight:      SpO2: 92% 94% 95% 96%   General  appearance: alert, cooperative and no distress Head: Normocephalic, without obvious abnormality, atraumatic Eyes: negative Nose: Nares normal. Septum midline. Mucosa normal. No drainage or sinus tenderness. Neck: no JVD and supple, symmetrical, trachea midline Lungs: clear to auscultation bilaterally Heart: regular rate and rhythm, S1, S2 normal, no murmur, click, rub or gallop Abdomen: soft, non-tender; bowel sounds normal; no masses,  no organomegaly Extremities: extremities normal, atraumatic, no cyanosis or edema Pulses: 2+ and symmetric Skin: Skin color, texture, turgor normal. No rashes or lesions Neurologic: Grossly normal    Labs on Admission:   Recent Labs  09/16/15 2228  NA 138  K 4.2  CL 106  CO2 23  GLUCOSE 347*  BUN 21*  CREATININE 1.00  CALCIUM 8.8*    Recent Labs  09/16/15 2228  WBC 9.3  NEUTROABS 7.1  HGB 12.2  HCT 36.7  MCV 90.4  PLT 230    Recent Labs  09/16/15 2228 09/17/15 0316  TROPONINI <0.03 0.13*   Radiological Exams on Admission: Dg Chest Port 1 View  09/16/2015  CLINICAL DATA:  70 year old female with shortness of breath and palpitation. EXAM: PORTABLE CHEST 1 VIEW COMPARISON:  Radiograph dated 08/02/2015 FINDINGS: The heart size and mediastinal contours are within normal limits. Both lungs are clear. The visualized skeletal structures are unremarkable. IMPRESSION: No active disease. Electronically Signed   By: Anner Crete M.D.   On: 09/16/2015 23:11   ekg reviewed, now nsr no acute issues, previous afib with rvr cxr reviewed no edema or infiltrate  Assessment/Plan  70 yo female with afib with rvr and anginal symptoms  Principal Problem:   Atrial fibrillation with rapid ventricular response ([place])- she is now converted to NSR on cardizem gtt and her chest pain has resolved.  Also has ntg paste on.  Initial trop normal and repeat is marginally eleveated.  Check thyroid studies.  Cont dilt gtt.  Cardiology consult here placed.     Active Problems:   Chest pain- likely related to uncontrolled rate.  Serial trop.  Pt had cath last month and says taking meds.  Obtain cards consult.   Hyperlipidemia- check flp in am   CAD, NATIVE VESSEL- noted   Palpitations- due to afib   Essential hypertension- noted   Paroxysmal atrial fibrillation ([place])- as above, anticoagulted   Hypothyroid- check thyroid studies   DM (diabetes mellitus), type 2, uncontrolled ([place])- cont her 70/30, place on ssi.  Check hga1c   Pain with urination- check ua  obs on stepdown.  Full code.  Brisha Mccabe A 09/17/2015, 4:28 AM

## 2015-09-17 NOTE — ED Notes (Signed)
Physician in to speak with pt

## 2015-09-17 NOTE — ED Notes (Signed)
Report to Lauren, RN

## 2015-09-17 NOTE — Progress Notes (Signed)
Patient seen and examined, database reviewed. Discussed with husband at bedside updated on plan of care and all questions answered. Patient with history of coronary artery disease status post stent placement last month, paroxysmal atrial fibrillation on xarelto, hypothyroidism and diabetes who came into the hospital overnight with complaints of palpitations and substernal chest pain that is typical with her A. fib. On admission she was found to be in A. fib with RVR with heart rates in the 60s and was started on a Cardizem drip. She has subsequently converted back to sinus rhythm with heart rates in the 60s, has been titrated off Cardizem drip and placed back on her home dose of labetalol. Initial troponin was 0.13 and has increased to 0.44. EKG shows lateral T-wave inversions, however these appear improved as per EKG in February 2017. Continue plan to cycle troponins repeat EKG in a.m. If troponins continue to rise may want to consider cardiology consultation, for now I believe that her minimally elevated troponin can easily be explained by demand ischemia from A. fib with RVR. We'll continue to follow closely.  Domingo Mend, MD [facility] Pager: [phone]

## 2015-09-17 NOTE — ED Notes (Signed)
Pt in NSR between 55-66. Dr. Jerilee Hoh made aware.

## 2015-09-17 NOTE — ED Notes (Signed)
hospitalist in to assess  

## 2015-09-17 NOTE — ED Notes (Signed)
Patient care reps at bedside.

## 2015-09-17 NOTE — ED Provider Notes (Signed)
CSN: [id number]     Arrival date & time 09/16/15  2153 History   First MD Initiated Contact with Patient 09/16/15 2301     Chief Complaint  Patient presents with  . Chest Pain     (Consider location/radiation/quality/duration/timing/severity/associated sxs/prior Treatment) HPI  This is a 70 year old female with history of hypertension, coronary artery disease status post stent placement in October and February, paroxysmal atrial fibrillation, diabetes who presents with chest pain and palpitations. Patient states onset of symptoms were approximately 9:30 PM. She had sudden onset of chest pressure and palpitations. There was associated shortness of breath. Patient reports similar history. Her initial diagnosis of atrial fibrillation was February. She is currently on Xarelto.  Started on Xarelto on March 13. When she presented in February she was in atrial fibrillation with RVR. Her initial troponin was negative but cycled enzymes increased. She had a repeat cath and stent placement at that time for an NSTEMI.  Currently her pain is 9 out of 10. She's not taken aspirin today.  Past Medical History  Diagnosis Date  . HTN (hypertension)   . Heart block   . Hypothyroidism   . Ovarian tumor   . Coronary artery disease 11/30/2014    a. remote MI. b. h/o PTCA with scoring balloon to OM1 11/2014. c. NSTEMI 03/2015 s/p DES to prox-mid Cx. d. NSTEMI 07/2015 s/p scoring balloon/PTCA/DES to dRCA with PAF during that admission  . UTI (urinary tract infection) 05/08/2013  . Superficial fungus infection of skin 06/29/2013  . TIA (transient ischemic attack) 08/2001; ~ 2006  . GERD (gastroesophageal reflux disease)   . Arthritis   . Hypercholesteremia   . Myocardial infarction ([place]) 02/2012  . Type II diabetes mellitus ([place])   . Cutaneous lupus erythematosus   . History of blood transfusion 1980's    2nd surgical procedures  . Pain with urination 05/08/2015  . Atrial fibrillation and flutter ([place])     a.  h/o PAF/flutter during admission in 2013 for PNA. b. PAF during adm for NSTEMI 07/2015.  [name] PAD (peripheral artery disease) ([place])     a. s/p LE angio 2015; followed by Dr. Fletcher Anon - managed medically.  . B12 deficiency anemia    Past Surgical History  Procedure Laterality Date  . 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 Partial hysterectomy  1970's    left ovaries, then ovaries removed later due tumors   . Left oophorectomy  05/1979    nicked bowel, peritonitis, colostomy; colostomy reversed 1981   . Nuclear med stress test  10/2011    Small area of mild ischemia inferoapically.  . Abdominal aortagram N/A 01/03/2014    Procedure: ABDOMINAL [name];  Surgeon: Wellington Hampshire, MD;  Location: [facility] CATH LAB;  Service: Cardiovascular;  Laterality: N/A;  . Lower extremity angiogram N/A 01/03/2014    Procedure: LOWER EXTREMITY ANGIOGRAM;  Surgeon: Wellington Hampshire, MD;  Location: [facility] CATH LAB;  Service: Cardiovascular;  Laterality: N/A;  . Colostomy reversal  11/1979  . Colonoscopy  2005    Dr. Laural Golden: pancolonic divericula, polyp, path unknown currently  . Appendectomy  1970's  . Cholecystectomy open  1990's  . Excisional hemorrhoidectomy  1970's  . Abdominal hysterectomy  1972    "partial"  . 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 Eye surgery Left ~ 2001    "smoothed out wrinkle"  . Cardiac catheterization  2008    Tiny OM-2 with 90% narrowing. Med tx.  . Cardiac catheterization N/A  11/30/2014    Procedure: Left Heart Cath and Coronary Angiography;  Surgeon: Troy Sine, MD; LAD 20%, CFX 50%, OM1 95%, right PLB 30%, LV normal   . Cardiac catheterization N/A 11/30/2014    Procedure: Coronary Balloon Angioplasty;  Surgeon: Troy Sine, MD;  Angiosculpt scoring balloon and PTCA to the OM1 reducing stenosis from 95% to less than 10%  . Right oophorectomy  1970's  . Cardiac catheterization N/A 04/03/2015    Procedure: Left Heart Cath and Coronary Angiography;  Surgeon: Jolaine Artist, MD; dLAD  50%, CFX 90%, OM1 100%, PLA 15%, LVEDP 13    . Cardiac catheterization N/A 04/03/2015    Procedure: Coronary Stent Intervention;  Surgeon: Sherren Mocha, MD; 3.0x18 mm Xience DES to the CFX    . Cardiac catheterization N/A 08/02/2015    Procedure: Left Heart Cath and Coronary Angiography;  Surgeon: Troy Sine, MD;  Location: [facility] CV LAB;  Service: Cardiovascular;  Laterality: N/A;  . Cardiac catheterization N/A 08/02/2015    Procedure: Coronary Stent Intervention;  Surgeon: Troy Sine, MD;  Location: [facility] CV LAB;  Service: Cardiovascular;  Laterality: N/A;  . Cardiac stents     Family History  Problem Relation Age of Onset  . Heart disease Mother     deceased  . Heart disease Father     deceased, heart disease  . Colon cancer Neg Hx   . Diabetes Brother   . Heart disease Brother   . Thyroid disease Brother   . Heart disease Sister   . Heart disease Brother   . Thyroid disease Brother   . Lupus Daughter    Social History  Substance Use Topics  . Smoking status: Never Smoker   . Smokeless tobacco: Never Used  . Alcohol Use: No   OB History    Gravida Para Term Preterm AB TAB SAB Ectopic Multiple Living   3 2   1  1   2      Review of Systems  Constitutional: Negative for fever.  Respiratory: Positive for shortness of breath.   Cardiovascular: Positive for chest pain and palpitations.  Gastrointestinal: Negative for nausea, vomiting and abdominal pain.  Neurological: Negative for dizziness.  All other systems reviewed and are negative.     Allergies  Penicillins and Percocet  Home Medications   Prior to Admission medications   Medication Sig Start Date End Date Taking? Authorizing Provider  amLODipine (NORVASC) 10 MG tablet TAKE ONE (1) TABLET EACH DAY 07/26/15  Yes Herminio Commons, MD  apixaban (ELIQUIS) 5 MG TABS tablet Take 1 tablet (5 mg total) by mouth 2 (two) times daily. 08/09/15  Yes Dayna N Dunn, PA-C  atorvastatin (LIPITOR) 80 MG  tablet Take 1 tablet (80 mg total) by mouth daily at 6 PM. 08/03/15  Yes Bhavinkumar Bhagat, PA  cholecalciferol (VITAMIN D) 1000 UNITS tablet Take 1,000 Units by mouth at bedtime.    Yes Historical Provider, MD  clopidogrel (PLAVIX) 75 MG tablet Take 1 tablet (75 mg total) by mouth daily. 08/09/15  Yes Dayna N Dunn, PA-C  furosemide (LASIX) 20 MG tablet Take 1 tablet (20 mg total) by mouth as needed for edema (leg swelling). 02/13/15  Yes Herminio Commons, MD  insulin NPH-regular Human (NOVOLIN 70/30) (70-30) 100 UNIT/ML injection Inject 45 Units into the skin 2 (two) times daily.   Yes Historical Provider, MD  labetalol (NORMODYNE) 300 MG tablet Take 1 tablet (300 mg total) by mouth 2 (two) times  daily. 05/02/15  Yes Herminio Commons, MD  levothyroxine (SYNTHROID, LEVOTHROID) 150 MCG tablet Take 150 mcg by mouth every morning.   Yes Historical Provider, MD  losartan (COZAAR) 100 MG tablet TAKE ONE (1) TABLET BY MOUTH EVERY DAY 06/10/15  Yes Herminio Commons, MD  metFORMIN (GLUCOPHAGE) 500 MG tablet Take 500 mg by mouth 3 (three) times daily with meals.    Yes Historical Provider, MD  nitroGLYCERIN (NITROSTAT) 0.4 MG SL tablet Place 1 tablet (0.4 mg total) under the tongue every 5 (five) minutes as needed for chest pain. 04/04/15  Yes Rhonda G Barrett, PA-C  Omega-3 Fatty Acids (FISH OIL) 1200 MG CAPS Take 1 capsule (1,200 mg total) by mouth daily. 02/13/15  Yes Herminio Commons, MD  potassium chloride (K-DUR) 10 MEQ tablet Take 1 tablet (10 mEq total) by mouth as needed (on days that you take your Lasix). 02/13/15  Yes Herminio Commons, MD  rOPINIRole (REQUIP) 0.5 MG tablet Take 0.5 mg by mouth at bedtime.  03/05/14  Yes Historical Provider, MD  ticagrelor (BRILINTA) 90 MG TABS tablet Take 1 tablet (90 mg total) by mouth 2 (two) times daily. Patient not taking: Reported on 09/16/2015 08/03/15   Bhavinkumar Bhagat, PA   BP 124/74 mmHg  Pulse 128  Temp(Src) 98 F (36.7 C)  Resp 23  Ht 5'  3" (1.6 m)  Wt 150 lb (68.04 kg)  BMI 26.58 kg/m2  SpO2 96% Physical Exam  Constitutional: She is oriented to person, place, and time. She appears well-developed and well-nourished. No distress.  HENT:  Head: Normocephalic and atraumatic.  Cardiovascular: Normal heart sounds.   Tachycardia, irregular rhythm  Pulmonary/Chest: Effort normal and breath sounds normal. No respiratory distress. She has no wheezes.  Abdominal: Soft. Bowel sounds are normal. There is no tenderness. There is no rebound.  Musculoskeletal: She exhibits no edema.  Neurological: She is alert and oriented to person, place, and time.  Skin: Skin is warm and dry.  Psychiatric: She has a normal mood and affect.  Nursing note and vitals reviewed.   ED Course  Procedures (including critical care time)  CRITICAL CARE Performed by: Merryl Hacker   Total critical care time: 45 minutes  Critical care time was exclusive of separately billable procedures and treating other patients.  Critical care was necessary to treat or prevent imminent or life-threatening deterioration.  Critical care was time spent personally by me on the following activities: development of treatment plan with patient and/or surrogate as well as nursing, discussions with consultants, evaluation of patient's response to treatment, examination of patient, obtaining history from patient or surrogate, ordering and performing treatments and interventions, ordering and review of laboratory studies, ordering and review of radiographic studies, pulse oximetry and re-evaluation of patient's condition.  Labs Review Labs Reviewed  BASIC METABOLIC PANEL - Abnormal; Notable for the following:    Glucose, Bld 347 (*)    BUN 21 (*)    Calcium 8.8 (*)    GFR calc non Af Amer 56 (*)    All other components within normal limits  BRAIN NATRIURETIC PEPTIDE - Abnormal; Notable for the following:    B Natriuretic Peptide 123.0 (*)    All other components  within normal limits  TROPONIN I  CBC WITH DIFFERENTIAL/PLATELET  TROPONIN I    Imaging Review Dg Chest Port 1 View  09/16/2015  CLINICAL DATA:  70 year old female with shortness of breath and palpitation. EXAM: PORTABLE CHEST 1 VIEW COMPARISON:  Radiograph dated  08/02/2015 FINDINGS: The heart size and mediastinal contours are within normal limits. Both lungs are clear. The visualized skeletal structures are unremarkable. IMPRESSION: No active disease. Electronically Signed   By: Anner Crete M.D.   On: 09/16/2015 23:11   I have personally reviewed and evaluated these images and lab results as part of my medical decision-making.   EKG Interpretation   Date/Time:  Monday September 16 2015 21:58:17 EDT Ventricular Rate:  149 PR Interval:    QRS Duration: 92 QT Interval:  332 QTC Calculation: 522 R Axis:   -10 Text Interpretation:  Atrial fibrillation with rapid ventricular response  Marked ST abnormality, possible inferior subendocardial injury Abnormal  ECG Confirmed by HORTON  MD, COURTNEY ([id number]) on 09/16/2015 11:01:14 PM      MDM   Final diagnoses:  Atrial fibrillation with RVR ([place])    Patient presents with atrial fibrillation with RVR. She was started on a diltiazem drip by my colleague prior to my arrival. She is still symptomatic with heart rates in the 130s. I have instructed nursing to increase the tilt drip. Patient was given a full dose aspirin. No other ischemic changes on EKG.  Basic lab work and chest x-ray obtained. Patient with a glucose of 347. Troponin is negative.  On multiple rechecks, patient has ongoing pain. She's given morphine and nitroglycerin ointment was applied. Hesitate to start on a nitroglycerin drip given the need for titration of medications will likely affect her blood pressure. Feel her pain is rate related. Her last catheterization had multiple lesions of 30 and 40%.    1:04 AM Discussed with Dr. Philbert Riser.  Discussion regarding further rate  control. Patient is high risk for cardioversion given her high chadsvasc eveng on Xarelto. She is currently hemodynamically stable Recommendation for further rate control. Titrate the drip as high as 20 and intermittent doses of metoprolol. On recheck, patient does report some improvement of pain. Heart rate is now 125. Dilt at 15.  We'll discuss with the admitting hospitalist.  Discussed with Dr. Shanon Brow.  Repeat Trop for 3 AM.  Merryl Hacker, MD 09/17/15 [phone]

## 2015-09-18 DIAGNOSIS — I2510 Atherosclerotic heart disease of native coronary artery without angina pectoris: Secondary | ICD-10-CM

## 2015-09-18 DIAGNOSIS — R079 Chest pain, unspecified: Secondary | ICD-10-CM

## 2015-09-18 DIAGNOSIS — E039 Hypothyroidism, unspecified: Secondary | ICD-10-CM

## 2015-09-18 DIAGNOSIS — I4891 Unspecified atrial fibrillation: Secondary | ICD-10-CM

## 2015-09-18 DIAGNOSIS — E1165 Type 2 diabetes mellitus with hyperglycemia: Secondary | ICD-10-CM

## 2015-09-18 DIAGNOSIS — E785 Hyperlipidemia, unspecified: Secondary | ICD-10-CM

## 2015-09-18 DIAGNOSIS — E1122 Type 2 diabetes mellitus with diabetic chronic kidney disease: Secondary | ICD-10-CM

## 2015-09-18 DIAGNOSIS — I25118 Atherosclerotic heart disease of native coronary artery with other forms of angina pectoris: Secondary | ICD-10-CM

## 2015-09-18 DIAGNOSIS — N182 Chronic kidney disease, stage 2 (mild): Secondary | ICD-10-CM

## 2015-09-18 DIAGNOSIS — I10 Essential (primary) hypertension: Secondary | ICD-10-CM

## 2015-09-18 LAB — BASIC METABOLIC PANEL
Anion gap: 7 (ref 5–15)
BUN: 13 mg/dL (ref 6–20)
CALCIUM: 8.3 mg/dL — AB (ref 8.9–10.3)
CO2: 24 mmol/L (ref 22–32)
CREATININE: 0.74 mg/dL (ref 0.44–1.00)
Chloride: 109 mmol/L (ref 101–111)
Glucose, Bld: 194 mg/dL — ABNORMAL HIGH (ref 65–99)
Potassium: 4 mmol/L (ref 3.5–5.1)
SODIUM: 140 mmol/L (ref 135–145)

## 2015-09-18 LAB — CBC
HCT: 33.7 % — ABNORMAL LOW (ref 36.0–46.0)
Hemoglobin: 11.2 g/dL — ABNORMAL LOW (ref 12.0–15.0)
MCH: 30.2 pg (ref 26.0–34.0)
MCHC: 33.2 g/dL (ref 30.0–36.0)
MCV: 90.8 fL (ref 78.0–100.0)
PLATELETS: 225 10*3/uL (ref 150–400)
RBC: 3.71 MIL/uL — ABNORMAL LOW (ref 3.87–5.11)
RDW: 14.2 % (ref 11.5–15.5)
WBC: 9.9 10*3/uL (ref 4.0–10.5)

## 2015-09-18 LAB — HEMOGLOBIN A1C
Hgb A1c MFr Bld: 7.5 % — ABNORMAL HIGH (ref 4.8–5.6)
MEAN PLASMA GLUCOSE: 169 mg/dL

## 2015-09-18 LAB — MAGNESIUM: Magnesium: 1.6 mg/dL — ABNORMAL LOW (ref 1.7–2.4)

## 2015-09-18 LAB — TROPONIN I: TROPONIN I: 0.2 ng/mL — AB (ref <0.031)

## 2015-09-18 LAB — GLUCOSE, CAPILLARY
Glucose-Capillary: 206 mg/dL — ABNORMAL HIGH (ref 65–99)
Glucose-Capillary: 297 mg/dL — ABNORMAL HIGH (ref 65–99)

## 2015-09-18 MED ORDER — MAGNESIUM OXIDE 400 (241.3 MG) MG PO TABS
800.0000 mg | ORAL_TABLET | Freq: Once | ORAL | Status: AC
  Administered 2015-09-18: 800 mg via ORAL
  Filled 2015-09-18: qty 2

## 2015-09-18 MED ORDER — AMIODARONE HCL 200 MG PO TABS: 200.0000 mg | ORAL_TABLET | Freq: Every day | ORAL | Status: DC

## 2015-09-18 MED ORDER — LOSARTAN POTASSIUM 50 MG PO TABS
100.0000 mg | ORAL_TABLET | Freq: Every day | ORAL | Status: DC
  Administered 2015-09-18: 100 mg via ORAL
  Filled 2015-09-18: qty 2

## 2015-09-18 MED ORDER — AMLODIPINE BESYLATE 5 MG PO TABS
10.0000 mg | ORAL_TABLET | Freq: Every day | ORAL | Status: DC
  Administered 2015-09-18: 10 mg via ORAL
  Filled 2015-09-18: qty 2

## 2015-09-18 MED ORDER — INSULIN ASPART PROT & ASPART (70-30 MIX) 100 UNIT/ML ~~LOC~~ SUSP: 50.0000 [IU] | Freq: Two times a day (BID) | SUBCUTANEOUS | Status: DC

## 2015-09-18 MED ORDER — AMIODARONE HCL 200 MG PO TABS
200.0000 mg | ORAL_TABLET | Freq: Every day | ORAL | Status: DC
  Administered 2015-09-18: 200 mg via ORAL
  Filled 2015-09-18: qty 1

## 2015-09-18 NOTE — Progress Notes (Signed)
Pt discharged home today per Dr. Wyline Copas.  Pt's IV site D/C'd and WDL.  Pt's VSS.  Pt provided with home medication list, discharge instructions and prescriptions.  Verbalized understanding.  Pt leaving floor in stable condition via WC accompanied by nursing staff.

## 2015-09-18 NOTE — Care Management Note (Signed)
Case Management Note  Patient Details  Name: Virginia Huber MRN: [id number] Date of Birth: 11-22-1945  Subjective/Objective:                  Pt is from home, lives with her husband and is ind with ADL's. Pt has no HH services PTA. Pt has PCP, transportation, family support and no difficulty obtaining medications. Pt plans on [place] home with self care.   Action/Plan: Pt discharged home today with self care. Husband at bedside. No CM needs.   Expected Discharge Date:      09/18/2015            Expected Discharge Plan:  Home/Self Care  In-House Referral:  NA  Discharge planning Services  CM Consult  Post Acute Care Choice:  NA Choice offered to:  NA  DME Arranged:    DME Agency:     HH Arranged:    HH Agency:     Status of Service:  Completed, signed off  Medicare Important Message Given:    Date Medicare IM Given:    Medicare IM give by:    Date Additional Medicare IM Given:    Additional Medicare Important Message give by:     If discussed at [place] of Stay Meetings, dates discussed:    Additional Comments:  Sherald Barge, RN 09/18/2015, 1:57 PM

## 2015-09-18 NOTE — Consult Note (Signed)
Reason for Consult: Atrial fibrillation and positive troponins Referring Physician: PTH Cardiologist:Dr. [name] is an 70 y.o. female.  HPI: Virginia Huber is a 70 y.o. female with history of CAD (remote MI; sp PTCA with scoring balloon to OM1 11/2014, NSTEMI 03/2015 s/p DES to prox-mid Cx, recent NSTEMI 07/2015 s/p scoring balloon/PTCA/DES to dRCA with PAF during that admission),  remote history of transient atrial fib/flutter in 2013 during admission for PNA, LE PAD (followed expectantly by Dr. Fletcher Anon), HTN, HLD, TIAs, DM, heart block (?details - listed in [place] by [place] in 2012, no mention of this from cardiology notes),  B12 deficiency anemia, hypothyroidism, who presents for post-hospital f/u.   She was admitted 07/2015 with chest pain and ruled in for NSTEMI. She was also found to be in rapid atrial fib and was treated with IV dilitazem and heparin. She converted to NSR in the ER on diltiazem drip. She underwent cath showing diffuse CAD (see report) with subsequent PTCA/DES to distal RCA, EF 55%. Plavix was stopped and she was changed to Brilinta for 30 days and on 09/02/15 switched to Plavix + Eliquis.  CHADSVASC = 7.   Patient now returns with rapid atrial fibrillation and chest pain. She converted to normal sinus rhythm with IV diltiazem after 6 hrs. Her troponins are positive 0.13, 0.44, 0.38, 0.33, 0.20. EKG shows normal sinus rhythm with nonspecific ST-T wave changes actually improved since 07/2015. Patient was sitting in her recliner when she felt her heart skipping and racing up to 159/m. She then developed chest tightness into her neck with shortness of breath. She hasn't missed any meds and has been walking 15 min twice a day without difficulty.    Past Medical History  Diagnosis Date  . HTN (hypertension)   . Heart block   . Hypothyroidism   . Ovarian tumor   . Coronary artery disease 11/30/2014    a. remote MI. b. h/o PTCA with scoring balloon to OM1 11/2014. c. NSTEMI  03/2015 s/p DES to prox-mid Cx. d. NSTEMI 07/2015 s/p scoring balloon/PTCA/DES to dRCA with PAF during that admission  . UTI (urinary tract infection) 05/08/2013  . Superficial fungus infection of skin 06/29/2013  . TIA (transient ischemic attack) 08/2001; ~ 2006  . GERD (gastroesophageal reflux disease)   . Arthritis   . Hypercholesteremia   . Myocardial infarction ([place]) 02/2012  . Type II diabetes mellitus ([place])   . Cutaneous lupus erythematosus   . History of blood transfusion 1980's    2nd surgical procedures  . Pain with urination 05/08/2015  . Atrial fibrillation and flutter ([place])     a. h/o PAF/flutter during admission in 2013 for PNA. b. PAF during adm for NSTEMI 07/2015.  [name] PAD (peripheral artery disease) ([place])     a. s/p LE angio 2015; followed by Dr. Fletcher Anon - managed medically.  . B12 deficiency anemia     Past Surgical History  Procedure Laterality Date  . 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 Partial hysterectomy  1970's    left ovaries, then ovaries removed later due tumors   . Left oophorectomy  05/1979    nicked bowel, peritonitis, colostomy; colostomy reversed 1981   . Nuclear med stress test  10/2011    Small area of mild ischemia inferoapically.  . Abdominal aortagram N/A 01/03/2014    Procedure: ABDOMINAL [name];  Surgeon: Wellington Hampshire, MD;  Location: [facility] CATH LAB;  Service: Cardiovascular;  Laterality: N/A;  . Lower extremity angiogram N/A 01/03/2014  Procedure: LOWER EXTREMITY ANGIOGRAM;  Surgeon: Wellington Hampshire, MD;  Location: [facility] CATH LAB;  Service: Cardiovascular;  Laterality: N/A;  . Colostomy reversal  11/1979  . Colonoscopy  2005    Dr. Laural Golden: pancolonic divericula, polyp, path unknown currently  . Appendectomy  1970's  . Cholecystectomy open  1990's  . Excisional hemorrhoidectomy  1970's  . Abdominal hysterectomy  1972    "partial"  . 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 Eye surgery Left ~ 2001    "smoothed out wrinkle"  . Cardiac  catheterization  2008    Tiny OM-2 with 90% narrowing. Med tx.  . Cardiac catheterization N/A 11/30/2014    Procedure: Left Heart Cath and Coronary Angiography;  Surgeon: Troy Sine, MD; LAD 20%, CFX 50%, OM1 95%, right PLB 30%, LV normal   . Cardiac catheterization N/A 11/30/2014    Procedure: Coronary Balloon Angioplasty;  Surgeon: Troy Sine, MD;  Angiosculpt scoring balloon and PTCA to the OM1 reducing stenosis from 95% to less than 10%  . Right oophorectomy  1970's  . Cardiac catheterization N/A 04/03/2015    Procedure: Left Heart Cath and Coronary Angiography;  Surgeon: Jolaine Artist, MD; dLAD 50%, CFX 90%, OM1 100%, PLA 15%, LVEDP 13    . Cardiac catheterization N/A 04/03/2015    Procedure: Coronary Stent Intervention;  Surgeon: Sherren Mocha, MD; 3.0x18 mm Xience DES to the CFX    . Cardiac catheterization N/A 08/02/2015    Procedure: Left Heart Cath and Coronary Angiography;  Surgeon: Troy Sine, MD;  Location: [facility] CV LAB;  Service: Cardiovascular;  Laterality: N/A;  . Cardiac catheterization N/A 08/02/2015    Procedure: Coronary Stent Intervention;  Surgeon: Troy Sine, MD;  Location: [facility] CV LAB;  Service: Cardiovascular;  Laterality: N/A;  . Cardiac stents      Family History  Problem Relation Age of Onset  . Heart disease Mother     deceased  . Heart disease Father     deceased, heart disease  . Colon cancer Neg Hx   . Diabetes Brother   . Heart disease Brother   . Thyroid disease Brother   . Heart disease Sister   . Heart disease Brother   . Thyroid disease Brother   . Lupus Daughter     Social History:  reports that she has never smoked. She has never used smokeless tobacco. She reports that she does not drink alcohol or use illicit drugs.   Allergies: PCN and percocet    Medications: Scheduled Meds: . apixaban  5 mg Oral BID  . atorvastatin  80 mg Oral q1800  . clopidogrel  75 mg Oral Daily  . insulin aspart  0-9 Units  Subcutaneous TID WC  . insulin aspart protamine- aspart  45 Units Subcutaneous BID WC  . labetalol  300 mg Oral BID  . levothyroxine  150 mcg Oral QAC breakfast  . rOPINIRole  0.5 mg Oral QHS   Continuous Infusions:  PRN Meds:.acetaminophen, furosemide, ondansetron (ZOFRAN) IV, potassium chloride   Results for orders placed or performed during the hospital encounter of 09/16/15 (from the past 48 hour(s))  Basic metabolic panel     Status: Abnormal   Collection Time: 09/16/15 10:28 PM  Result Value Ref Range   Sodium 138 135 - 145 mmol/L   Potassium 4.2 3.5 - 5.1 mmol/L   Chloride 106 101 - 111 mmol/L   CO2 23 22 - 32 mmol/L  Glucose, Bld 347 (H) 65 - 99 mg/dL   BUN 21 (H) 6 - 20 mg/dL   Creatinine, Ser 1.00 0.44 - 1.00 mg/dL   Calcium 8.8 (L) 8.9 - 10.3 mg/dL   GFR calc non Af Amer 56 (L) >60 mL/min   GFR calc Af Amer >60 >60 mL/min    Comment: (NOTE) The eGFR has been calculated using the CKD EPI equation. This calculation has not been validated in all clinical situations. eGFR's persistently <60 mL/min signify possible Chronic Kidney Disease.    Anion gap 9 5 - 15  Brain natriuretic peptide     Status: Abnormal   Collection Time: 09/16/15 10:28 PM  Result Value Ref Range   B Natriuretic Peptide 123.0 (H) 0.0 - 100.0 pg/mL  Troponin I     Status: None   Collection Time: 09/16/15 10:28 PM  Result Value Ref Range   Troponin I <0.03 <0.031 ng/mL    Comment:        NO INDICATION OF MYOCARDIAL INJURY.   CBC with Differential     Status: None   Collection Time: 09/16/15 10:28 PM  Result Value Ref Range   WBC 9.3 4.0 - 10.5 K/uL   RBC 4.06 3.87 - 5.11 MIL/uL   Hemoglobin 12.2 12.0 - 15.0 g/dL   HCT 36.7 36.0 - 46.0 %   MCV 90.4 78.0 - 100.0 fL   MCH 30.0 26.0 - 34.0 pg   MCHC 33.2 30.0 - 36.0 g/dL   RDW 14.1 11.5 - 15.5 %   Platelets 230 150 - 400 K/uL   Neutrophils Relative % 77 %   Neutro Abs 7.1 1.7 - 7.7 K/uL   Lymphocytes Relative 15 %   Lymphs Abs 1.4 0.7 -  4.0 K/uL   Monocytes Relative 6 %   Monocytes Absolute 0.6 0.1 - 1.0 K/uL   Eosinophils Relative 2 %   Eosinophils Absolute 0.2 0.0 - 0.7 K/uL   Basophils Relative 0 %   Basophils Absolute 0.0 0.0 - 0.1 K/uL  Urinalysis, Routine w reflex microscopic (not at [facility])     Status: Abnormal   Collection Time: 09/17/15 12:01 AM  Result Value Ref Range   Color, Urine STRAW (A) YELLOW   APPearance HAZY (A) CLEAR   Specific Gravity, Urine 1.010 1.005 - 1.030   pH 5.0 5.0 - 8.0   Glucose, UA >1000 (A) NEGATIVE mg/dL   Hgb urine dipstick NEGATIVE NEGATIVE   Bilirubin Urine NEGATIVE NEGATIVE   Ketones, ur NEGATIVE NEGATIVE mg/dL   Protein, ur NEGATIVE NEGATIVE mg/dL   Nitrite NEGATIVE NEGATIVE   Leukocytes, UA NEGATIVE NEGATIVE  Urine microscopic-add on     Status: Abnormal   Collection Time: 09/17/15 12:01 AM  Result Value Ref Range   Squamous Epithelial / LPF 0-5 (A) NONE SEEN   WBC, UA 0-5 0 - 5 WBC/hpf   RBC / HPF 0-5 0 - 5 RBC/hpf   Bacteria, UA MANY (A) NONE SEEN  Troponin I     Status: Abnormal   Collection Time: 09/17/15  3:16 AM  Result Value Ref Range   Troponin I 0.13 (H) <0.031 ng/mL    Comment:        PERSISTENTLY INCREASED TROPONIN VALUES IN THE RANGE OF 0.04-0.49 ng/mL CAN BE SEEN IN:       -UNSTABLE ANGINA       -CONGESTIVE HEART FAILURE       -MYOCARDITIS       -CHEST TRAUMA       -  ARRYHTHMIAS       -LATE PRESENTING MYOCARDIAL INFARCTION       -COPD   CLINICAL FOLLOW-UP RECOMMENDED.   Hemoglobin A1c     Status: Abnormal   Collection Time: 09/17/15  3:16 AM  Result Value Ref Range   Hgb A1c MFr Bld 7.5 (H) 4.8 - 5.6 %    Comment: (NOTE)         Pre-diabetes: 5.7 - 6.4         Diabetes: >6.4         Glycemic control for adults with diabetes: <7.0    Mean Plasma Glucose 169 mg/dL    Comment: (NOTE) Performed At: [facility] [place], [place] [id number] Lindon Romp MD [id number]   Lipid panel     Status: Abnormal   Collection  Time: 09/17/15  3:16 AM  Result Value Ref Range   Cholesterol 91 0 - 200 mg/dL   Triglycerides 247 (H) <150 mg/dL   HDL 26 (L) >40 mg/dL   Total CHOL/HDL Ratio 3.5 RATIO   VLDL 49 (H) 0 - 40 mg/dL   LDL Cholesterol 16 0 - 99 mg/dL    Comment:        Total Cholesterol/HDL:CHD Risk Coronary Heart Disease Risk Table                     Men   Women  1/2 Average Risk   3.4   3.3  Average Risk       5.0   4.4  2 X Average Risk   9.6   7.1  3 X Average Risk  23.4   11.0        Use the calculated Patient Ratio above and the CHD Risk Table to determine the patient's CHD Risk.        ATP III CLASSIFICATION (LDL):  <100     mg/dL   Optimal  100-129  mg/dL   Near or Above                    Optimal  130-159  mg/dL   Borderline  160-189  mg/dL   High  >190     mg/dL   Very High   CBG monitoring, ED     Status: Abnormal   Collection Time: 09/17/15  7:31 AM  Result Value Ref Range   Glucose-Capillary 330 (H) 65 - 99 mg/dL  TSH     Status: Abnormal   Collection Time: 09/17/15 10:40 AM  Result Value Ref Range   TSH 0.263 (L) 0.350 - 4.500 uIU/mL  T4, free     Status: None   Collection Time: 09/17/15 10:40 AM  Result Value Ref Range   Free T4 1.08 0.61 - 1.12 ng/dL    Comment: Performed at [facility]  Troponin I     Status: Abnormal   Collection Time: 09/17/15 10:40 AM  Result Value Ref Range   Troponin I 0.44 (H) <0.031 ng/mL    Comment:        PERSISTENTLY INCREASED TROPONIN VALUES IN THE RANGE OF 0.04-0.49 ng/mL CAN BE SEEN IN:       -UNSTABLE ANGINA       -CONGESTIVE HEART FAILURE       -MYOCARDITIS       -CHEST TRAUMA       -ARRYHTHMIAS       -LATE PRESENTING MYOCARDIAL INFARCTION       -COPD   CLINICAL  FOLLOW-UP RECOMMENDED.   CBG monitoring, ED     Status: Abnormal   Collection Time: 09/17/15 11:50 AM  Result Value Ref Range   Glucose-Capillary 341 (H) 65 - 99 mg/dL  Glucose, capillary     Status: Abnormal   Collection Time: 09/17/15  4:40 PM  Result  Value Ref Range   Glucose-Capillary 298 (H) 65 - 99 mg/dL   Comment 1 Notify RN    Comment 2 Document in Chart   Troponin I     Status: Abnormal   Collection Time: 09/17/15  4:47 PM  Result Value Ref Range   Troponin I 0.38 (H) <0.031 ng/mL    Comment:        PERSISTENTLY INCREASED TROPONIN VALUES IN THE RANGE OF 0.04-0.49 ng/mL CAN BE SEEN IN:       -UNSTABLE ANGINA       -CONGESTIVE HEART FAILURE       -MYOCARDITIS       -CHEST TRAUMA       -ARRYHTHMIAS       -LATE PRESENTING MYOCARDIAL INFARCTION       -COPD   CLINICAL FOLLOW-UP RECOMMENDED.   Glucose, capillary     Status: Abnormal   Collection Time: 09/17/15  9:02 PM  Result Value Ref Range   Glucose-Capillary 247 (H) 65 - 99 mg/dL   Comment 1 Notify RN    Comment 2 Document in Chart   Troponin I (q 6hr x 3)     Status: Abnormal   Collection Time: 09/17/15 10:01 PM  Result Value Ref Range   Troponin I 0.33 (H) <0.031 ng/mL    Comment:        PERSISTENTLY INCREASED TROPONIN VALUES IN THE RANGE OF 0.04-0.49 ng/mL CAN BE SEEN IN:       -UNSTABLE ANGINA       -CONGESTIVE HEART FAILURE       -MYOCARDITIS       -CHEST TRAUMA       -ARRYHTHMIAS       -LATE PRESENTING MYOCARDIAL INFARCTION       -COPD   CLINICAL FOLLOW-UP RECOMMENDED.   Troponin I (q 6hr x 3)     Status: Abnormal   Collection Time: 09/18/15  5:43 AM  Result Value Ref Range   Troponin I 0.20 (H) <0.031 ng/mL    Comment:        PERSISTENTLY INCREASED TROPONIN VALUES IN THE RANGE OF 0.04-0.49 ng/mL CAN BE SEEN IN:       -UNSTABLE ANGINA       -CONGESTIVE HEART FAILURE       -MYOCARDITIS       -CHEST TRAUMA       -ARRYHTHMIAS       -LATE PRESENTING MYOCARDIAL INFARCTION       -COPD   CLINICAL FOLLOW-UP RECOMMENDED.     Dg Chest Port 1 View  09/16/2015  CLINICAL DATA:  69 year old female with shortness of breath and palpitation. EXAM: PORTABLE CHEST 1 VIEW COMPARISON:  Radiograph dated 08/02/2015 FINDINGS: The heart size and mediastinal  contours are within normal limits. Both lungs are clear. The visualized skeletal structures are unremarkable. IMPRESSION: No active disease. Electronically Signed   By: Anner Crete M.D.   On: 09/16/2015 23:11    ROS  See HPI Eyes: Negative Ears:Negative for hearing loss, tinnitus Cardiovascular: Negative for chest pain, palpitations,irregular heartbeat, dyspnea, dyspnea on exertion, near-syncope, orthopnea, paroxysmal nocturnal dyspnea and syncope,edema, claudication, cyanosis,.  Respiratory:   Negative for cough, hemoptysis, shortness  of breath, sleep disturbances due to breathing, sputum production and wheezing.   Endocrine: Negative for cold intolerance and heat intolerance.  Hematologic/Lymphatic: Negative for adenopathy and bleeding problem. Does not bruise/bleed easily.  Musculoskeletal: Negative.   Gastrointestinal: Negative for nausea, vomiting, reflux, abdominal pain, diarrhea, constipation.   Genitourinary: Negative for bladder incontinence, dysuria, flank pain, frequency, hematuria, hesitancy, nocturia and urgency.  Neurological: Negative.  Allergic/Immunologic: Negative for environmental allergies.  Blood pressure 131/55, pulse 71, temperature 97.5 F (36.4 C), temperature source Oral, resp. rate 18, height 5' 3"  (1.6 m), weight 150 lb 8 oz (68.266 kg), SpO2 95 %. Physical Exam PHYSICAL EXAM: Well-nournished, in no acute distress. Neck: No JVD, HJR, Bruit, or thyroid enlargement Lungs: No tachypnea, clear without wheezing, rales, or rhonchi Cardiovascular: RRR, PMI not displaced, heart sounds normal, no murmurs, gallops, bruit, thrill, or heave. Abdomen: BS normal. Soft without organomegaly, masses, lesions or tenderness. Extremities: without cyanosis, clubbing or edema. Good distal pulses bilateral SKin: Warm, no lesions or rashes  Musculoskeletal: No deformities Neuro: no focal signs    Assessment/Plan: Rapid atrial fibrillation converted to normal sinus rhythm  with IV diltiazem. CHADSVASC=7. Patient on Plavix and Eliquis. Patient on Normodyne 300 mg twice daily and HR 55-70 in NSR.Will discuss adding another agent or watching on current dose with MD  Positive troponins most likely secondary to demand ischemia from rapid atrial fibrillation, EKG without acute change. Will resume Cozaar and Norvasc.Would ambulate and see how she does.  CAD with recent NSTEMI status post scoring balloon/PTCA/DES to the distal RCA with PAF that admission,NSTMEI 03/2015 status post DES to the proximal mid circumflex, PTCA of the OM1 in 11/2014. Continue Plavix and Eliquis.  Essential hypertension controlled resume Cozaar and Norvasc  Hyperlipidemia on Lipitor  Abnormal thyroid started on synthroid.    Virginia Huber 09/18/2015, 7:47 AM   The patient was seen and examined, and I agree with the history, physical exam, assessment and plan as documented above which has been discussed with Virginia Scale PA-C, with modifications as noted below. Pt well known to me admitted with rapid atrial fibrillation and demand ischemia with CAD and h/o NSTEMI. Currently symptomatically stable and in sinus rhythm. Denies chest pain. Has been walking or doing the exercise bicycle 15 minutes once to twice daily. Mg 1.6 (slightly low today)  I will give a one time dose of magnesium oxide 800 mg. She had been taking Mg Oxide every other day at home, and I've encouraged her to take it daily. I'm not inclined to increase labetalol. I do not want to keep her on longterm amiodarone given her thyroid disease. That being said, I will start 200 mg daily x 2 weeks. She has a f/u appt with me on 4/14, at which time I will reevaluate. I think she is stable for discharge.  Kate Sable, MD, [facility]  09/18/2015 9:14 AM

## 2015-09-18 NOTE — Progress Notes (Signed)
Inpatient Diabetes Program Recommendations  AACE/ADA: New Consensus Statement on Inpatient Glycemic Control (2015)  Target Ranges:  Prepandial:   less than 140 mg/dL      Peak postprandial:   less than 180 mg/dL (1-2 hours)      Critically ill patients:  140 - 180 mg/dL  Results for Virginia Huber, Virginia Huber (MRN [id number]) as of 09/18/2015 09:15  Ref. Range 09/17/2015 07:31 09/17/2015 11:50 09/17/2015 16:40 09/17/2015 21:02 09/18/2015 07:45  Glucose-Capillary Latest Ref Range: 65-99 mg/dL 330 (H) 341 (H) 298 (H) 247 (H) 206 (H)   Review of Glycemic Control  Diabetes history: DM2 Current orders for Inpatient glycemic control: 70/30 45 units BID, Novolog 0-9 units TID with meals  Inpatient Diabetes Program Recommendations: Insulin - Basal: Please consider increasing 70/30 to 50 units BID. Correction (SSI): Please consider ordering Novolog bedtime correction scale.  Thanks, Barnie Alderman, RN, MSN, CDE Diabetes Coordinator Inpatient Diabetes Program [phone] (Team Pager from [place] to [place]) [phone] ([facility] office) [phone] [facility] office) [phone] [facility] office)

## 2015-09-18 NOTE — Discharge Summary (Signed)
Physician Discharge Summary  Virginia Huber [id number] DOB: April 30, 1946 DOA: 09/16/2015  PCP: Purvis Kilts, MD  Admit date: 09/16/2015 Discharge date: 09/18/2015  Time spent: 20 minutes  Recommendations for Outpatient Follow-up:  1. Follow up with PCP in 2-3 weeks 2. Follow up with Cardiology as scheduled on 4/14   Discharge Diagnoses:  Principal Problem:   Atrial fibrillation with rapid ventricular response ([place]) Active Problems:   Hyperlipidemia   CAD, NATIVE VESSEL   Palpitations   Essential hypertension   Paroxysmal atrial fibrillation (HCC)   Hypothyroid   DM (diabetes mellitus), type 2, uncontrolled ([place])   Pain with urination   Chest pain   Atrial fibrillation with tachycardic ventricular rate [facility])   Discharge Condition: Improved  Diet recommendation: Heart healthy, diabetic  Filed Weights   09/16/15 2159 09/18/15 0558  Weight: 68.04 kg (150 lb) 68.266 kg (150 lb 8 oz)    History of present illness:  Please review dictated H and P from 3/28 for details. Briefly, 70yo with hx of afib, CAD, HTN who presented with palpitations, admitted for afib with RVR.  Hospital Course:   Atrial fibrillation with rapid ventricular response ([place])-  Patient was initially started on cardizem gtt. With improvement in HR.Cardiology was consulted. Patient was recommended to start amiodarone PO on discharge . Electrolytes were corrected. Patient was cleared from Cardiology for discharge with close follow up. Patient was continued on Eliquis for secondary stroke prevention   Chest pain- likely related to demand mismatch from tachycardia. Serial trop trended down. Pt had cath last month and says taking meds. Seen by Cardiology   Hyperlipidemia- LDL 16 with TG of 247   CAD, NATIVE VESSEL- followed by Cardiology   Essential hypertension- Remained stable   Hypothyroid- TSH noted to be mildly low at 0.263, however Free T4 normal   DM (diabetes mellitus), type 2,  uncontrolled ([place])- cont her 70/30, place on ssi. Ha1c of 7.5   Presenting pain with urination- UA was normal  Consultations:  Cardiology  Discharge Exam: Filed Vitals:   09/17/15 2015 09/17/15 2104 09/18/15 0558 09/18/15 1040  BP:  152/58 131/55 138/55  Pulse:  70 71 60  Temp:  98 F (36.7 C) 97.5 F (36.4 C)   TempSrc:  Oral Oral   Resp:  20 18   Height:      Weight:   68.266 kg (150 lb 8 oz)   SpO2: 95% 97% 95%     General: Awake, in nad Cardiovascular: regular, s1, s2 Respiratory: normal resp effort, no wheezing  Discharge Instructions     Medication List    STOP taking these medications        ticagrelor 90 MG Tabs tablet  Commonly known as:  BRILINTA      TAKE these medications        amiodarone 200 MG tablet  Commonly known as:  PACERONE  Take 1 tablet (200 mg total) by mouth daily.     amLODipine 10 MG tablet  Commonly known as:  NORVASC  TAKE ONE (1) TABLET EACH DAY     apixaban 5 MG Tabs tablet  Commonly known as:  ELIQUIS  Take 1 tablet (5 mg total) by mouth 2 (two) times daily.     atorvastatin 80 MG tablet  Commonly known as:  LIPITOR  Take 1 tablet (80 mg total) by mouth daily at 6 PM.     cholecalciferol 1000 units tablet  Commonly known as:  VITAMIN D  Take 1,000 Units  by mouth at bedtime.     clopidogrel 75 MG tablet  Commonly known as:  PLAVIX  Take 1 tablet (75 mg total) by mouth daily.     Fish Oil 1200 MG Caps  Take 1 capsule (1,200 mg total) by mouth daily.     furosemide 20 MG tablet  Commonly known as:  LASIX  Take 1 tablet (20 mg total) by mouth as needed for edema (leg swelling).     insulin NPH-regular Human (70-30) 100 UNIT/ML injection  Commonly known as:  NOVOLIN 70/30  Inject 45 Units into the skin 2 (two) times daily.     labetalol 300 MG tablet  Commonly known as:  NORMODYNE  Take 1 tablet (300 mg total) by mouth 2 (two) times daily.     levothyroxine 150 MCG tablet  Commonly known as:  SYNTHROID,  LEVOTHROID  Take 150 mcg by mouth every morning.     losartan 100 MG tablet  Commonly known as:  COZAAR  TAKE ONE (1) TABLET BY MOUTH EVERY DAY     metFORMIN 500 MG tablet  Commonly known as:  GLUCOPHAGE  Take 500 mg by mouth 3 (three) times daily with meals.     nitroGLYCERIN 0.4 MG SL tablet  Commonly known as:  NITROSTAT  Place 1 tablet (0.4 mg total) under the tongue every 5 (five) minutes as needed for chest pain.     potassium chloride 10 MEQ tablet  Commonly known as:  K-DUR  Take 1 tablet (10 mEq total) by mouth as needed (on days that you take your Lasix).     rOPINIRole 0.5 MG tablet  Commonly known as:  REQUIP  Take 0.5 mg by mouth at bedtime.          The results of significant diagnostics from this hospitalization (including imaging, microbiology, ancillary and laboratory) are listed below for reference.    Significant Diagnostic Studies: Dg Chest Port 1 View  09/16/2015  CLINICAL DATA:  70 year old female with shortness of breath and palpitation. EXAM: PORTABLE CHEST 1 VIEW COMPARISON:  Radiograph dated 08/02/2015 FINDINGS: The heart size and mediastinal contours are within normal limits. Both lungs are clear. The visualized skeletal structures are unremarkable. IMPRESSION: No active disease. Electronically Signed   By: Anner Crete M.D.   On: 09/16/2015 23:11    Microbiology: No results found for this or any previous visit (from the past 240 hour(s)).   Labs: Basic Metabolic Panel:  Recent Labs Lab 09/16/15 2228 09/18/15 0645  NA 138 140  K 4.2 4.0  CL 106 109  CO2 23 24  GLUCOSE 347* 194*  BUN 21* 13  CREATININE 1.00 0.74  CALCIUM 8.8* 8.3*  MG  --  1.6*   Liver Function Tests: No results for input(s): AST, ALT, ALKPHOS, BILITOT, PROT, ALBUMIN in the last 168 hours. No results for input(s): LIPASE, AMYLASE in the last 168 hours. No results for input(s): AMMONIA in the last 168 hours. CBC:  Recent Labs Lab 09/16/15 2228 09/18/15 0645   WBC 9.3 9.9  NEUTROABS 7.1  --   HGB 12.2 11.2*  HCT 36.7 33.7*  MCV 90.4 90.8  PLT 230 225   Cardiac Enzymes:  Recent Labs Lab 09/17/15 0316 09/17/15 1040 09/17/15 1647 09/17/15 2201 09/18/15 0543  TROPONINI 0.13* 0.44* 0.38* 0.33* 0.20*   BNP: BNP (last 3 results)  Recent Labs  09/16/15 2228  BNP 123.0*    ProBNP (last 3 results) No results for input(s): PROBNP in the last 8760  hours.  CBG:  Recent Labs Lab 09/17/15 0731 09/17/15 1150 09/17/15 1640 09/17/15 2102 09/18/15 0745  GLUCAP 330* 341* 298* 247* 206*   Signed:  CHIU, STEPHEN K  [facility] 09/18/2015, 10:58 AM

## 2015-09-18 NOTE — Discharge Instructions (Signed)
Amiodarone tablets °What is this medicine? °AMIODARONE (a MEE oh da rone) is an antiarrhythmic drug. It helps make your heart beat regularly. Because of the side effects caused by this medicine, it is only used when other medicines have not worked. It is usually used for heartbeat problems that may be life threatening. °This medicine may be used for other purposes; ask your health care provider or pharmacist if you have questions. °What should I tell my health care provider before I take this medicine? °They need to know if you have any of these conditions: °-liver disease °-lung disease °-other heart problems °-thyroid disease °-an unusual or allergic reaction to amiodarone, iodine, other medicines, foods, dyes, or preservatives °-pregnant or trying to get pregnant °-breast-feeding °How should I use this medicine? °Take this medicine by mouth with a glass of water. Follow the directions on the prescription label. You can take this medicine with or without food. However, you should always take it the same way each time. Take your doses at regular intervals. Do not take your medicine more often than directed. Do not stop taking except on the advice of your doctor or health care professional. °A special MedGuide will be given to you by the pharmacist with each prescription and refill. Be sure to read this information carefully each time. °Talk to your pediatrician regarding the use of this medicine in children. Special care may be needed. °Overdosage: If you think you have taken too much of this medicine contact a poison control center or emergency room at once. °NOTE: This medicine is only for you. Do not share this medicine with others. °What if I miss a dose? °If you miss a dose, take it as soon as you can. If it is almost time for your next dose, take only that dose. Do not take double or extra doses. °What may interact with this medicine? °Do not take this medicine with any of the following  medications: °-abarelix °-apomorphine °-arsenic trioxide °-certain antibiotics like erythromycin, gemifloxacin, levofloxacin, pentamidine °-certain medicines for depression like amoxapine, tricyclic antidepressants °-certain medicines for fungal infections like fluconazole, itraconazole, ketoconazole, posaconazole, voriconazole °-certain medicines for irregular heart beat like disopyramide, dofetilide, dronedarone, ibutilide, propafenone, sotalol °-certain medicines for malaria like chloroquine, halofantrine °-cisapride °-droperidol °-haloperidol °-hawthorn °-maprotiline °-methadone °-phenothiazines like chlorpromazine, mesoridazine, thioridazine °-pimozide °-ranolazine °-red yeast rice °-vardenafil °-ziprasidone °This medicine may also interact with the following medications: °-antiviral medicines for HIV or AIDS °-certain medicines for blood pressure, heart disease, irregular heart beat °-certain medicines for cholesterol like atorvastatin, cerivastatin, lovastatin, simvastatin °-certain medicines for hepatitis C like sofosbuvir and ledipasvir; sofosbuvir °-certain medicines for seizures like phenytoin °-certain medicines for thyroid problems °-certain medicines that treat or prevent blood clots like warfarin °-cholestyramine °-cimetidine °-clopidogrel °-cyclosporine °-dextromethorphan °-diuretics °-fentanyl °-general anesthetics °-grapefruit juice °-lidocaine °-loratadine °-methotrexate °-other medicines that prolong the QT interval (cause an abnormal heart rhythm) °-procainamide °-quinidine °-rifabutin, rifampin, or rifapentine °-St. John's Wort °-trazodone °This list may not describe all possible interactions. Give your health care provider a list of all the medicines, herbs, non-prescription drugs, or dietary supplements you use. Also tell them if you smoke, drink alcohol, or use illegal drugs. Some items may interact with your medicine. °What should I watch for while using this medicine? °Your condition will  be monitored closely when you first begin therapy. Often, this drug is first started in a hospital or other monitored health care setting. Once you are on maintenance therapy, visit your doctor or health care professional for regular   checks on your progress. Because your condition and use of this medicine carry some risk, it is a good idea to carry an identification card, necklace or bracelet with details of your condition, medications, and doctor or health care professional. °You may get drowsy or dizzy. Do not drive, use machinery, or do anything that needs mental alertness until you know how this medicine affects you. Do not stand or sit up quickly, especially if you are an older patient. This reduces the risk of dizzy or fainting spells. °This medicine can make you more sensitive to the sun. Keep out of the sun. If you cannot avoid being in the sun, wear protective clothing and use sunscreen. Do not use sun lamps or tanning beds/booths. °You should have regular eye exams before and during treatment. Call your doctor if you have blurred vision, see halos, or your eyes become sensitive to light. Your eyes may get dry. It may be helpful to use a lubricating eye solution or artificial tears solution. °If you are going to have surgery or a procedure that requires contrast dyes, tell your doctor or health care professional that you are taking this medicine. °What side effects may I notice from receiving this medicine? °Side effects that you should report to your doctor or health care professional as soon as possible: °-allergic reactions like skin rash, itching or hives, swelling of the face, lips, or tongue °-blue-gray coloring of the skin °-blurred vision, seeing blue green halos, increased sensitivity of the eyes to light °-breathing problems °-chest pain °-dark urine °-fast, irregular heartbeat °-feeling faint or light-headed °-intolerance to heat or cold °-nausea or vomiting °-pain and swelling of the  scrotum °-pain, tingling, numbness in feet, hands °-redness, blistering, peeling or loosening of the skin, including inside the mouth °-spitting up blood °-stomach pain °-sweating °-unusual or uncontrolled movements of body °-unusually weak or tired °-weight gain or loss °-yellowing of the eyes or skin °Side effects that usually do not require medical attention (report to your doctor or health care professional if they continue or are bothersome): °-change in sex drive or performance °-constipation °-dizziness °-headache °-loss of appetite °-trouble sleeping °This list may not describe all possible side effects. Call your doctor for medical advice about side effects. You may report side effects to FDA at 1-800-FDA-1088. °Where should I keep my medicine? °Keep out of the reach of children. °Store at room temperature between 20 and 25 degrees C (68 and 77 degrees F). Protect from light. Keep container tightly closed. Throw away any unused medicine after the expiration date. °NOTE: This sheet is a summary. It may not cover all possible information. If you have questions about this medicine, talk to your doctor, pharmacist, or health care provider. °  °© 2016, [employer]. (2013-09-11 19:48:11) ° °

## 2015-09-24 DIAGNOSIS — Z1389 Encounter for screening for other disorder: Secondary | ICD-10-CM | POA: Diagnosis not present

## 2015-09-24 DIAGNOSIS — I4891 Unspecified atrial fibrillation: Secondary | ICD-10-CM | POA: Diagnosis not present

## 2015-09-24 DIAGNOSIS — E663 Overweight: Secondary | ICD-10-CM | POA: Diagnosis not present

## 2015-09-24 DIAGNOSIS — R350 Frequency of micturition: Secondary | ICD-10-CM | POA: Diagnosis not present

## 2015-09-24 DIAGNOSIS — Z6827 Body mass index (BMI) 27.0-27.9, adult: Secondary | ICD-10-CM | POA: Diagnosis not present

## 2015-09-25 ENCOUNTER — Telehealth: Payer: Self-pay | Admitting: *Deleted

## 2015-09-25 NOTE — Telephone Encounter (Signed)
[place] called Dr. [name] has sent Cipro for pt for UTI and pt is concerned with taking this with amiodarone. Will forward to Dr Bronson Ing

## 2015-09-26 NOTE — Telephone Encounter (Signed)
[place] informed.

## 2015-09-26 NOTE — Telephone Encounter (Signed)
Ok to take for short duration.

## 2015-10-02 ENCOUNTER — Ambulatory Visit (HOSPITAL_COMMUNITY)
Admission: RE | Admit: 2015-10-02 | Discharge: 2015-10-02 | Disposition: A | Discharge status: Home / Self Care | Payer: PPO | Source: Ambulatory Visit | Attending: Cardiology | Admitting: Cardiology

## 2015-10-02 ENCOUNTER — Ambulatory Visit (INDEPENDENT_AMBULATORY_CARE_PROVIDER_SITE_OTHER): Payer: PPO | Admitting: Cardiology

## 2015-10-02 ENCOUNTER — Telehealth: Payer: Self-pay | Admitting: *Deleted

## 2015-10-02 ENCOUNTER — Telehealth: Payer: Self-pay | Admitting: Cardiovascular Disease

## 2015-10-02 ENCOUNTER — Other Ambulatory Visit (HOSPITAL_COMMUNITY)
Admission: RE | Admit: 2015-10-02 | Discharge: 2015-10-02 | Disposition: A | Discharge status: Home / Self Care | Payer: PPO | Source: Ambulatory Visit | Attending: Cardiology | Admitting: Cardiology

## 2015-10-02 ENCOUNTER — Encounter: Payer: Self-pay | Admitting: Cardiology

## 2015-10-02 VITALS — BP 118/50 | HR 59 | Wt 156.0 lb

## 2015-10-02 DIAGNOSIS — R0602 Shortness of breath: Secondary | ICD-10-CM | POA: Diagnosis not present

## 2015-10-02 DIAGNOSIS — Z9889 Other specified postprocedural states: Secondary | ICD-10-CM | POA: Diagnosis not present

## 2015-10-02 DIAGNOSIS — J810 Acute pulmonary edema: Secondary | ICD-10-CM | POA: Diagnosis not present

## 2015-10-02 DIAGNOSIS — I4891 Unspecified atrial fibrillation: Secondary | ICD-10-CM | POA: Diagnosis not present

## 2015-10-02 DIAGNOSIS — E785 Hyperlipidemia, unspecified: Secondary | ICD-10-CM

## 2015-10-02 DIAGNOSIS — I5031 Acute diastolic (congestive) heart failure: Secondary | ICD-10-CM | POA: Diagnosis not present

## 2015-10-02 DIAGNOSIS — I10 Essential (primary) hypertension: Secondary | ICD-10-CM

## 2015-10-02 DIAGNOSIS — I480 Paroxysmal atrial fibrillation: Secondary | ICD-10-CM

## 2015-10-02 DIAGNOSIS — J90 Pleural effusion, not elsewhere classified: Secondary | ICD-10-CM | POA: Diagnosis not present

## 2015-10-02 DIAGNOSIS — I2510 Atherosclerotic heart disease of native coronary artery without angina pectoris: Secondary | ICD-10-CM

## 2015-10-02 LAB — CBC
HCT: 32.4 % — ABNORMAL LOW (ref 36.0–46.0)
HEMOGLOBIN: 10.8 g/dL — AB (ref 12.0–15.0)
MCH: 30.8 pg (ref 26.0–34.0)
MCHC: 33.3 g/dL (ref 30.0–36.0)
MCV: 92.3 fL (ref 78.0–100.0)
Platelets: 239 10*3/uL (ref 150–400)
RBC: 3.51 MIL/uL — AB (ref 3.87–5.11)
RDW: 14.7 % (ref 11.5–15.5)
WBC: 14.6 10*3/uL — AB (ref 4.0–10.5)

## 2015-10-02 LAB — BASIC METABOLIC PANEL
ANION GAP: 10 (ref 5–15)
BUN: 21 mg/dL — ABNORMAL HIGH (ref 6–20)
CHLORIDE: 106 mmol/L (ref 101–111)
CO2: 22 mmol/L (ref 22–32)
Calcium: 8.7 mg/dL — ABNORMAL LOW (ref 8.9–10.3)
Creatinine, Ser: 1.2 mg/dL — ABNORMAL HIGH (ref 0.44–1.00)
GFR calc non Af Amer: 45 mL/min — ABNORMAL LOW (ref >60)
GFR, EST AFRICAN AMERICAN: 52 mL/min — AB (ref >60)
Glucose, Bld: 106 mg/dL — ABNORMAL HIGH (ref 65–99)
POTASSIUM: 4.4 mmol/L (ref 3.5–5.1)
SODIUM: 138 mmol/L (ref 135–145)

## 2015-10-02 LAB — BRAIN NATRIURETIC PEPTIDE: B NATRIURETIC PEPTIDE 5: 459 pg/mL — AB (ref 0.0–100.0)

## 2015-10-02 MED ORDER — POTASSIUM CHLORIDE CRYS ER 20 MEQ PO TBCR: 20.0000 meq | EXTENDED_RELEASE_TABLET | Freq: Every day | ORAL | Status: DC

## 2015-10-02 MED ORDER — FUROSEMIDE 20 MG PO TABS: 60.0000 mg | ORAL_TABLET | Freq: Every day | ORAL | Status: DC

## 2015-10-02 NOTE — Telephone Encounter (Signed)
Called patient with test results. No answer. Left message to call back.  

## 2015-10-02 NOTE — Telephone Encounter (Signed)
Apt made for today,states wt is up 7 lbs this week

## 2015-10-02 NOTE — Telephone Encounter (Signed)
Pt called stating she's having a little trouble breathing & she has put on a few lbs and is not sure if it's fluid or not. She's wanting someone to call her & let her know if she should be seen prior to Friday

## 2015-10-02 NOTE — Patient Instructions (Signed)
Your physician recommends that you schedule a follow-up appointment in: as scheduled with Dr Bronson Ing 10/04/15 at 11:30    Get Chest x-ray now  Get blood work now   INCREASE Lasix to 60 mg daily  INCREASE Potassium to 20 meq daily

## 2015-10-02 NOTE — Telephone Encounter (Signed)
-----   Message from Isaiah Serge, NP sent at 10/02/2015  4:15 PM EDT ----- Please let pt now her CXR shows fluid.  No pneumonia, lasix should help this.

## 2015-10-02 NOTE — Progress Notes (Signed)
Cardiology Office Note   Date:  10/02/2015   ID:  Virginia Huber, DOB 1946-01-01, MRN [id number]  PCP:  Purvis Kilts, MD  Cardiologist:  Dr. Jacinta Shoe    Chief Complaint  Patient presents with  . Shortness of Breath    edema, pain with deep breath       History of Present Illness: Virginia Huber is a 70 y.o. female who presents for 7 lb wt gain.    Sh has a history of CAD (remote MI; sp PTCA with scoring balloon to OM1 11/2014, NSTEMI 03/2015 s/p DES to prox-mid Cx, recent NSTEMI 07/2015 s/p scoring balloon/PTCA/DES to dRCA with PAF during that admission), remote history of transient atrial fib/flutter in 2013 during admission for PNA, LE PAD (followed expectantly by Dr. Fletcher Anon), HTN, HLD, TIAs, DM, heart block (?details - listed in [place] by [place] in 2012, no mention of this from cardiology notes), B12 deficiency anemia, hypothyroidism,.  Recently hospitalized  3/27 to 3/29 2017 for a fib with RVR.  She was placed on IV dilt then amiodarone.  Pt on Eliquis. CHADSVASC = 7, troponin was elevated secondary to demand ischemia from PAF.  She was placed on amiodarone po with plan to stop on 10/01/15 which she did..  With eliquis her brilinta was changed to Plavix.   Today she reports she [place] and she became SOB.  + edema of both legs.  They came home early because she felt bad.  She has to prop up to sleep at night.  No chest pain.  She has continued her Eliquis.  Her wt is up 7 lbs and she denies any chest pain.  No  Afib that she is aware of.   Her appetite is decreased my be due to the amiodarone that she has now stopped.   Past Medical History  Diagnosis Date  . HTN (hypertension)   . Heart block   . Hypothyroidism   . Ovarian tumor   . Coronary artery disease 11/30/2014    a. remote MI. b. h/o PTCA with scoring balloon to OM1 11/2014. c. NSTEMI 03/2015 s/p DES to prox-mid Cx. d. NSTEMI 07/2015 s/p scoring balloon/PTCA/DES to dRCA with PAF during  that admission  . UTI (urinary tract infection) 05/08/2013  . Superficial fungus infection of skin 06/29/2013  . TIA (transient ischemic attack) 08/2001; ~ 2006  . GERD (gastroesophageal reflux disease)   . Arthritis   . Hypercholesteremia   . Myocardial infarction ([place]) 02/2012  . Type II diabetes mellitus ([place])   . Cutaneous lupus erythematosus   . History of blood transfusion 1980's    2nd surgical procedures  . Pain with urination 05/08/2015  . Atrial fibrillation and flutter ([place])     a. h/o PAF/flutter during admission in 2013 for PNA. b. PAF during adm for NSTEMI 07/2015.  [name] PAD (peripheral artery disease) ([place])     a. s/p LE angio 2015; followed by Dr. Fletcher Anon - managed medically.  . B12 deficiency anemia     Past Surgical History  Procedure Laterality Date  . 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 Partial hysterectomy  1970's    left ovaries, then ovaries removed later due tumors   . Left oophorectomy  05/1979    nicked bowel, peritonitis, colostomy; colostomy reversed 1981   . Nuclear med stress test  10/2011    Small area of mild ischemia inferoapically.  . Abdominal aortagram N/A 01/03/2014    Procedure: ABDOMINAL AORTAGRAM;  Surgeon: Wellington Hampshire, MD;  Location: [facility] CATH LAB;  Service: Cardiovascular;  Laterality: N/A;  . Lower extremity angiogram N/A 01/03/2014    Procedure: LOWER EXTREMITY ANGIOGRAM;  Surgeon: Wellington Hampshire, MD;  Location: [facility] CATH LAB;  Service: Cardiovascular;  Laterality: N/A;  . Colostomy reversal  11/1979  . Colonoscopy  2005    Dr. Laural Golden: pancolonic divericula, polyp, path unknown currently  . Appendectomy  1970's  . Cholecystectomy open  1990's  . Excisional hemorrhoidectomy  1970's  . Abdominal hysterectomy  1972    "partial"  . 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 Eye surgery Left ~ 2001    "smoothed out wrinkle"  . Cardiac catheterization  2008    Tiny OM-2 with 90% narrowing. Med tx.  . Cardiac catheterization N/A 11/30/2014     Procedure: Left Heart Cath and Coronary Angiography;  Surgeon: Troy Sine, MD; LAD 20%, CFX 50%, OM1 95%, right PLB 30%, LV normal   . Cardiac catheterization N/A 11/30/2014    Procedure: Coronary Balloon Angioplasty;  Surgeon: Troy Sine, MD;  Angiosculpt scoring balloon and PTCA to the OM1 reducing stenosis from 95% to less than 10%  . Right oophorectomy  1970's  . Cardiac catheterization N/A 04/03/2015    Procedure: Left Heart Cath and Coronary Angiography;  Surgeon: Jolaine Artist, MD; dLAD 50%, CFX 90%, OM1 100%, PLA 15%, LVEDP 13    . Cardiac catheterization N/A 04/03/2015    Procedure: Coronary Stent Intervention;  Surgeon: Sherren Mocha, MD; 3.0x18 mm Xience DES to the CFX    . Cardiac catheterization N/A 08/02/2015    Procedure: Left Heart Cath and Coronary Angiography;  Surgeon: Troy Sine, MD;  Location: [facility] CV LAB;  Service: Cardiovascular;  Laterality: N/A;  . Cardiac catheterization N/A 08/02/2015    Procedure: Coronary Stent Intervention;  Surgeon: Troy Sine, MD;  Location: [facility] CV LAB;  Service: Cardiovascular;  Laterality: N/A;  . Cardiac stents       Current Outpatient Prescriptions  Medication Sig Dispense Refill  . amLODipine (NORVASC) 10 MG tablet TAKE ONE (1) TABLET EACH DAY 90 tablet 3  . apixaban (ELIQUIS) 5 MG TABS tablet Take 1 tablet (5 mg total) by mouth 2 (two) times daily. 60 tablet 3  . atorvastatin (LIPITOR) 80 MG tablet Take 1 tablet (80 mg total) by mouth daily at 6 PM. 30 tablet 6  . cholecalciferol (VITAMIN D) 1000 UNITS tablet Take 1,000 Units by mouth at bedtime.     . clopidogrel (PLAVIX) 75 MG tablet Take 1 tablet (75 mg total) by mouth daily. 90 tablet 3  . insulin NPH-regular Human (NOVOLIN 70/30) (70-30) 100 UNIT/ML injection Inject 45 Units into the skin 2 (two) times daily.    [name] labetalol (NORMODYNE) 300 MG tablet Take 1 tablet (300 mg total) by mouth 2 (two) times daily. 180 tablet 2  . levothyroxine (SYNTHROID,  LEVOTHROID) 150 MCG tablet Take 150 mcg by mouth every morning.    [name] losartan (COZAAR) 100 MG tablet TAKE ONE (1) TABLET BY MOUTH EVERY DAY 90 tablet 3  . metFORMIN (GLUCOPHAGE) 500 MG tablet Take 500 mg by mouth 3 (three) times daily with meals.     . nitroGLYCERIN (NITROSTAT) 0.4 MG SL tablet Place 1 tablet (0.4 mg total) under the tongue every 5 (five) minutes as needed for chest pain. 25 tablet 12  . Omega-3 Fatty Acids (FISH OIL) 1200 MG CAPS Take 1 capsule (  1,200 mg total) by mouth daily.    [name] rOPINIRole (REQUIP) 0.5 MG tablet Take 0.5 mg by mouth at bedtime.   0  . furosemide (LASIX) 20 MG tablet Take 3 tablets (60 mg total) by mouth daily. 90 tablet 3  . potassium chloride SA (KLOR-CON M20) 20 MEQ tablet Take 1 tablet (20 mEq total) by mouth daily. 90 tablet 3   No current facility-administered medications for this visit.    Allergies:   Penicillins and Percocet    Social History:  The patient  reports that she has never smoked. She has never used smokeless tobacco. She reports that she does not drink alcohol or use illicit drugs.   Family History:  The patient's family history includes Diabetes in her brother; Heart disease in her brother, brother, father, mother, and sister; Lupus in her daughter; Thyroid disease in her brother and brother. There is no history of Colon cancer.    ROS:  General:no colds or fevers, no weight changes Skin:no rashes or ulcers HEENT:no blurred vision, no congestion CV:see HPI PUL:see HPI GI:no diarrhea constipation or melena, no indigestion GU:no hematuria, no dysuria MS:no joint pain, no claudication Neuro:no syncope, no lightheadedness Endo:+ diabetes, no thyroid disease  Wt Readings from Last 3 Encounters:  10/02/15 156 lb (70.761 kg)  09/18/15 150 lb 8 oz (68.266 kg)  08/09/15 152 lb (68.947 kg)     PHYSICAL EXAM: VS:  BP 118/50 mmHg  Pulse 59  Wt 156 lb (70.761 kg)  SpO2 94% , BMI Body mass index is 27.64 kg/(m^2). General:Pleasant  affect, NAD but SOB and edematous.  Skin:Warm and dry, brisk capillary refill HEENT:normocephalic, sclera clear, mucus membranes moist Neck:supple, no JVD, no bruits  Heart:S1S2 RRR without murmur, gallup, rub or click Lungs:diminished without rales, occ rhonchi, no current wheezes [id number], non tender, + BS, do not palpate liver spleen or masses Ext:2-3+ lower ext edema to her knees, 2+ pedal pulses, 2+ radial pulses Neuro:alert and oriented X 3, MAE, follows commands, + facial symmetry    EKG:  EKG is ordered today. The ekg ordered today demonstrates SR at 66, no acute changes- reviewed with Dr. Harl Bowie.     Recent Labs: 08/09/2015: ALT 16 09/16/2015: B Natriuretic Peptide 123.0* 09/17/2015: TSH 0.263* 09/18/2015: BUN 13; Creatinine, Ser 0.74; Hemoglobin 11.2*; Magnesium 1.6*; Platelets 225; Potassium 4.0; Sodium 140    Lipid Panel    Component Value Date/Time   CHOL 91 09/17/2015 0316   TRIG 247* 09/17/2015 0316   HDL 26* 09/17/2015 0316   CHOLHDL 3.5 09/17/2015 0316   VLDL 49* 09/17/2015 0316   LDLCALC 16 09/17/2015 0316       Other studies Reviewed: Additional studies/ records that were reviewed today include: . Echo 05/2015 Study Conclusions  - Left ventricle: The cavity size was normal. Wall thickness was  increased in a pattern of mild LVH. Systolic function was normal.  The estimated ejection fraction was in the range of 55% to 60%.  Wall motion was normal; there were no regional wall motion  abnormalities. Features are consistent with a pseudonormal left  ventricular filling pattern, with concomitant abnormal relaxation  and increased filling pressure (grade 2 diastolic dysfunction). - Aortic valve: Mildly calcified annulus. Trileaflet; mildly  thickened leaflets. Valve area (VTI): 2.07 cm^2. Valve area  (Vmax): 1.98 cm^2. - Mitral valve: Mildly calcified annulus. Mildly thickened leaflets  . - Left atrium: The atrium was severely dilated. - Atrial  septum: No defect or patent foramen ovale was identified. -  Technically adequate study.  Last cath:  1st RPLB lesion, 50% stenosed.  Dist RCA lesion, 30% stenosed.  Ost 1st Mrg to 1st Mrg lesion, 100% stenosed.  Ost LAD lesion, 40% stenosed.  Mid LAD lesion, 40% stenosed.  Post Atrio lesion, 90% stenosed. Post intervention, there is a 0% residual stenosis.  The left ventricular systolic function is normal.  Mid RCA lesion, 20% stenosed.  Normal LV function without focal segmental wall motion abnormalities and ejection fraction of 55%.  Significant coronary obstructive disease with diffuse mild luminal narrowing of the LAD with 40% proximal and mid stenoses; widely patent stent in the proximal circumflex with old occlusion of the marginal branch which had arisen in the region of the stented segment but with evidence for very faint collateralization to this distal marginal vessel from the the LAD; a large dominant RCA with 20%, mild mid narrowing, 30% narrowing after the acute margin and focal 90% stenosis distally prior to a PDA 2 vessel with 50% narrowing at the ostium of this PDA prior to a bend in the vessel with the RCA ending in the PLA vessel.  Successful percutaneous cardiac intervention to the distal RCA treated with Angiosculpt scoring balloon, PTCA, and ultimate stenting with a 2.58 mm Xience Alpine DES stent postdilated to 2.51 mm with a 90% stenosis reduced to 0% and no change in the ostial PDA narrowing.  RECOMMENDATION: Since the patient suffered a non-ST segment elevation myocardial infarction on Plavix, Brilinta was administered for oral antiplatelet therapy. It was felt most likely the patient's atrial fibrillation was ischemic mediated. If anticoagulation is necessary, Brilinta will need to be discontinued and changed back to Plavix due to potential bleed risk if triple therapy is needed short-term.  ASSESSMENT AND PLAN:  1.   Acute on chronic diastolic HF.   Increasing SOB at rest and exertion.  7 lb wt gain.  Needing to sit up at night at times.  Some wheezing.  She has taken 20 of lasix for last 2 days with K+.  Will check BNP and BMP and CBC.  Also 2 v CXR.  - with HF and stable EF there may be viral component as well.  --will increase lasix to 60 daily for now and increase K+ to 20 meq daily will check K+ level.   -- She will follow up with Dr. Jacinta Shoe on the 14th as planned.  He will decide diuretics at that point.   --also discussed if she felt she could manage this at home and she preferred to try increasing lasix. For now.   2. Recent hx of rapid atrial fibrillation converted to normal sinus rhythm with IV diltiazem 09/17/15. CHADSVASC=7. Patient on Plavix and Eliquis. Patient on Normodyne 300 mg twice daily and HR 55-70 in NSR amiodarone was added short term last dose was yesterday.   3.  Recent positive troponins most likely secondary to demand ischemia from rapid atrial fibrillation, EKG without acute change in hospital and today.   4. CAD with recent NSTEMI status post scoring balloon/PTCA/DES to the distal RCA with PAF that admission 07/2015. ,NSTMEI 03/2015 status post DES to the proximal mid circumflex, PTCA of the OM1 in 11/2014. Continue Plavix and Eliquis.  5.  Essential hypertension controlled   6.  Hyperlipidemia on Lipitor  7.  Abnormal thyroid started on synthroid during hospitalization.  8. ? Viral syndrome as well.  She will continue to weigh daily and if no improvement she will call.  If any chest pain she will  go to ER.     Current medicines are reviewed with the patient today.  The patient Has no concerns regarding medicines.  The following changes have been made:  See above Labs/ tests ordered today include:see above  Disposition:   FU:  see above  Lennie Muckle, NP  10/02/2015 3:14 PM    [facility], [place], [place], [place] Phone: [phone]; Fax: [phone]

## 2015-10-03 LAB — COMPREHENSIVE METABOLIC PANEL
ALBUMIN: 3.4 g/dL — AB (ref 3.6–5.1)
ALK PHOS: 53 U/L (ref 33–130)
ALT: 9 U/L (ref 6–29)
AST: 7 U/L — AB (ref 10–35)
BILIRUBIN TOTAL: 1.1 mg/dL (ref 0.2–1.2)
BUN: 17 mg/dL (ref 7–25)
CALCIUM: 8.6 mg/dL (ref 8.6–10.4)
CO2: 23 mmol/L (ref 20–31)
CREATININE: 0.93 mg/dL (ref 0.60–0.93)
Chloride: 108 mmol/L (ref 98–110)
Glucose, Bld: 114 mg/dL — ABNORMAL HIGH (ref 65–99)
Potassium: 4.2 mmol/L (ref 3.5–5.3)
SODIUM: 141 mmol/L (ref 135–146)
TOTAL PROTEIN: 5.3 g/dL — AB (ref 6.1–8.1)

## 2015-10-03 LAB — CBC
HEMATOCRIT: 33.7 % — AB (ref 35.0–45.0)
HEMOGLOBIN: 10.6 g/dL — AB (ref 11.7–15.5)
MCH: 29 pg (ref 27.0–33.0)
MCHC: 31.5 g/dL — ABNORMAL LOW (ref 32.0–36.0)
MCV: 92.3 fL (ref 80.0–100.0)
MPV: 10.2 fL (ref 7.5–12.5)
Platelets: 245 10*3/uL (ref 140–400)
RBC: 3.65 MIL/uL — AB (ref 3.80–5.10)
RDW: 15.7 % — ABNORMAL HIGH (ref 11.0–15.0)
WBC: 13 10*3/uL — AB (ref 3.8–10.8)

## 2015-10-03 LAB — LIPID PANEL
Cholesterol: 97 mg/dL — ABNORMAL LOW (ref 125–200)
HDL: 36 mg/dL — AB (ref >=46)
LDL Cholesterol: 38 mg/dL (ref <130)
Total CHOL/HDL Ratio: 2.7 Ratio (ref <=5.0)
Triglycerides: 117 mg/dL (ref <150)
VLDL: 23 mg/dL (ref <30)

## 2015-10-04 ENCOUNTER — Ambulatory Visit (INDEPENDENT_AMBULATORY_CARE_PROVIDER_SITE_OTHER): Payer: PPO | Admitting: Cardiovascular Disease

## 2015-10-04 ENCOUNTER — Encounter: Payer: Self-pay | Admitting: Cardiovascular Disease

## 2015-10-04 VITALS — BP 124/58 | HR 61 | Ht 63.0 in | Wt 153.2 lb

## 2015-10-04 DIAGNOSIS — I252 Old myocardial infarction: Secondary | ICD-10-CM

## 2015-10-04 DIAGNOSIS — I10 Essential (primary) hypertension: Secondary | ICD-10-CM

## 2015-10-04 DIAGNOSIS — Z9289 Personal history of other medical treatment: Secondary | ICD-10-CM

## 2015-10-04 DIAGNOSIS — R0602 Shortness of breath: Secondary | ICD-10-CM | POA: Diagnosis not present

## 2015-10-04 DIAGNOSIS — I25118 Atherosclerotic heart disease of native coronary artery with other forms of angina pectoris: Secondary | ICD-10-CM

## 2015-10-04 DIAGNOSIS — E785 Hyperlipidemia, unspecified: Secondary | ICD-10-CM

## 2015-10-04 DIAGNOSIS — I5031 Acute diastolic (congestive) heart failure: Secondary | ICD-10-CM | POA: Diagnosis not present

## 2015-10-04 DIAGNOSIS — Z79899 Other long term (current) drug therapy: Secondary | ICD-10-CM

## 2015-10-04 DIAGNOSIS — G4730 Sleep apnea, unspecified: Secondary | ICD-10-CM

## 2015-10-04 DIAGNOSIS — Z87898 Personal history of other specified conditions: Secondary | ICD-10-CM

## 2015-10-04 DIAGNOSIS — I480 Paroxysmal atrial fibrillation: Secondary | ICD-10-CM | POA: Diagnosis not present

## 2015-10-04 DIAGNOSIS — Z955 Presence of coronary angioplasty implant and graft: Secondary | ICD-10-CM

## 2015-10-04 DIAGNOSIS — I739 Peripheral vascular disease, unspecified: Secondary | ICD-10-CM

## 2015-10-04 MED ORDER — METOLAZONE 2.5 MG PO TABS: 2.5000 mg | ORAL_TABLET | Freq: Every day | ORAL | Status: DC

## 2015-10-04 NOTE — Patient Instructions (Signed)
Medication Instructions:  Start lasix 40 mg two times daily ( FRI.,SAT.,SUN., & MON.) then resume original dose Start metolazone 2.5 mg daily ( FRI., SAT., SUN., & MON.) then stop    Labwork: Your physician recommends that you return for lab work in: Monday BMET  Testing/Procedures: NONE  Follow-Up: Your physician recommends that you schedule a follow-up appointment in: NEXT WEEK    Any Other Special Instructions Will Be Listed Below (If Applicable).     If you need a refill on your cardiac medications before your next appointment, please call your pharmacy.

## 2015-10-04 NOTE — Progress Notes (Signed)
Patient ID: Virginia Huber, female   DOB: 01/23/1946, 70 y.o.   MRN: [id number]      SUBJECTIVE: Virginia Huber presents for follow-up of acute on chronic diastolic heart failure. She also has a history of coronary artery disease, atrial fibrillation, hypertension, and hyperlipidemia.  Echocardiogram on 05/24/15 demonstrated normal left ventricular systolic function and regional wall motion, LVEF [id number], grade 2 diastolic dysfunction, and severe left atrial enlargement.  She has had numerous cardiovascular issues in the past several months. She underwent coronary angiography for a non-STEMI on 08/02/15 and underwent successful percutaneous intervention to the distal RCA with drug-eluting stent placement for 90% stenosis.  She was then hospitalized for rapid atrial fibrillation in March at which time I consulted on her. Brilinta was switched to Plavix. She was also on Eliquis and amiodarone, the latter of which was recently stopped.   She then saw L. Ingold PA-C for a 7 pound weight gain on 4/12. Lasix was increased to 60 mg daily.  Chest x-ray on 4/12 showed possible mild pulmonary edema and new small bilateral pleural effusions.  Lipids on 4/12 showed LDL 38, HDL 36, cholesterol 97, triglycerides 117.  CBC 10/02/15 showed hemoglobin 10.6, platelets 245.  Basic metabolic panel [id number] showed BUN 17, creatinine 0.93, potassium 4.2.  BNP elevated 459.  She says her baseline weight at home is between 150 and 151 pounds. Yesterday she weighed 156.5 pounds and today she weighed 155.5 pounds. Her shortness of breath has only mildly improved. She has no appetite. Leg swelling has decreased to some degree.  All these symptoms began when she climbed [facility].    Review of Systems: As per "subjective", otherwise negative.    Current Outpatient Prescriptions  Medication Sig Dispense Refill  . amLODipine (NORVASC) 10 MG tablet TAKE ONE (1) TABLET EACH DAY 90 tablet 3  . apixaban (ELIQUIS)  5 MG TABS tablet Take 1 tablet (5 mg total) by mouth 2 (two) times daily. 60 tablet 3  . atorvastatin (LIPITOR) 80 MG tablet Take 1 tablet (80 mg total) by mouth daily at 6 PM. 30 tablet 6  . cholecalciferol (VITAMIN D) 1000 UNITS tablet Take 1,000 Units by mouth at bedtime.     . clopidogrel (PLAVIX) 75 MG tablet Take 1 tablet (75 mg total) by mouth daily. 90 tablet 3  . furosemide (LASIX) 20 MG tablet Take 3 tablets (60 mg total) by mouth daily. 90 tablet 3  . insulin NPH-regular Human (NOVOLIN 70/30) (70-30) 100 UNIT/ML injection Inject 45 Units into the skin 2 (two) times daily.    [name] labetalol (NORMODYNE) 300 MG tablet Take 1 tablet (300 mg total) by mouth 2 (two) times daily. 180 tablet 2  . levothyroxine (SYNTHROID, LEVOTHROID) 150 MCG tablet Take 150 mcg by mouth every morning.    [name] losartan (COZAAR) 100 MG tablet TAKE ONE (1) TABLET BY MOUTH EVERY DAY 90 tablet 3  . metFORMIN (GLUCOPHAGE) 500 MG tablet Take 500 mg by mouth 3 (three) times daily with meals.     . nitroGLYCERIN (NITROSTAT) 0.4 MG SL tablet Place 1 tablet (0.4 mg total) under the tongue every 5 (five) minutes as needed for chest pain. 25 tablet 12  . Omega-3 Fatty Acids (FISH OIL) 1200 MG CAPS Take 1 capsule (1,200 mg total) by mouth daily.    . potassium chloride SA (KLOR-CON M20) 20 MEQ tablet Take 1 tablet (20 mEq total) by mouth daily. 90 tablet 3  . rOPINIRole (REQUIP) 0.5 MG tablet Take 0.5 mg  by mouth at bedtime.   0   No current facility-administered medications for this visit.    Past Medical History  Diagnosis Date  . HTN (hypertension)   . Heart block   . Hypothyroidism   . Ovarian tumor   . Coronary artery disease 11/30/2014    a. remote MI. b. h/o PTCA with scoring balloon to OM1 11/2014. c. NSTEMI 03/2015 s/p DES to prox-mid Cx. d. NSTEMI 07/2015 s/p scoring balloon/PTCA/DES to dRCA with PAF during that admission  . UTI (urinary tract infection) 05/08/2013  . Superficial fungus infection of skin 06/29/2013    . TIA (transient ischemic attack) 08/2001; ~ 2006  . GERD (gastroesophageal reflux disease)   . Arthritis   . Hypercholesteremia   . Myocardial infarction ([place]) 02/2012  . Type II diabetes mellitus ([place])   . Cutaneous lupus erythematosus   . History of blood transfusion 1980's    2nd surgical procedures  . Pain with urination 05/08/2015  . Atrial fibrillation and flutter ([place])     a. h/o PAF/flutter during admission in 2013 for PNA. b. PAF during adm for NSTEMI 07/2015.  [name] PAD (peripheral artery disease) ([place])     a. s/p LE angio 2015; followed by Dr. Fletcher Anon - managed medically.  . B12 deficiency anemia     Past Surgical History  Procedure Laterality Date  . 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 Partial hysterectomy  1970's    left ovaries, then ovaries removed later due tumors   . Left oophorectomy  05/1979    nicked bowel, peritonitis, colostomy; colostomy reversed 1981   . Nuclear med stress test  10/2011    Small area of mild ischemia inferoapically.  . Abdominal aortagram N/A 01/03/2014    Procedure: ABDOMINAL [name];  Surgeon: Wellington Hampshire, MD;  Location: [facility] CATH LAB;  Service: Cardiovascular;  Laterality: N/A;  . Lower extremity angiogram N/A 01/03/2014    Procedure: LOWER EXTREMITY ANGIOGRAM;  Surgeon: Wellington Hampshire, MD;  Location: [place] CATH LAB;  Service: Cardiovascular;  Laterality: N/A;  . Colostomy reversal  11/1979  . Colonoscopy  2005    Dr. Laural Golden: pancolonic divericula, polyp, path unknown currently  . Appendectomy  1970's  . Cholecystectomy open  1990's  . Excisional hemorrhoidectomy  1970's  . Abdominal hysterectomy  1972    "partial"  . 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 Eye surgery Left ~ 2001    "smoothed out wrinkle"  . Cardiac catheterization  2008    Tiny OM-2 with 90% narrowing. Med tx.  . Cardiac catheterization N/A 11/30/2014    Procedure: Left Heart Cath and Coronary Angiography;  Surgeon: Troy Sine, MD; LAD 20%, CFX 50%, OM1 95%, right  PLB 30%, LV normal   . Cardiac catheterization N/A 11/30/2014    Procedure: Coronary Balloon Angioplasty;  Surgeon: Troy Sine, MD;  Angiosculpt scoring balloon and PTCA to the OM1 reducing stenosis from 95% to less than 10%  . Right oophorectomy  1970's  . Cardiac catheterization N/A 04/03/2015    Procedure: Left Heart Cath and Coronary Angiography;  Surgeon: Jolaine Artist, MD; dLAD 50%, CFX 90%, OM1 100%, PLA 15%, LVEDP 13    . Cardiac catheterization N/A 04/03/2015    Procedure: Coronary Stent Intervention;  Surgeon: Sherren Mocha, MD; 3.0x18 mm Xience DES to the CFX    . Cardiac catheterization N/A 08/02/2015    Procedure: Left Heart Cath and Coronary Angiography;  Surgeon: Troy Sine, MD;  Location: [place] CV LAB;  Service: Cardiovascular;  Laterality: N/A;  . Cardiac catheterization N/A 08/02/2015    Procedure: Coronary Stent Intervention;  Surgeon: Troy Sine, MD;  Location: [place] CV LAB;  Service: Cardiovascular;  Laterality: N/A;  . Cardiac stents      Social History   Social History  . Marital Status: Married    Spouse Name: N/A  . Number of Children: N/A  . Years of Education: N/A   Occupational History  . Retired     Research officer, political party   Social History Main Topics  . Smoking status: Never Smoker   . Smokeless tobacco: Never Used  . Alcohol Use: No  . Drug Use: No  . Sexual Activity: No     Comment: hyst   Other Topics Concern  . Not on file   Social History Narrative     Filed Vitals:   10/04/15 1138  BP: 124/58  Pulse: 61  Height: 5\' 3"  (1.6 m)  Weight: 153 lb 3.2 oz (69.491 kg)  SpO2: 93%    PHYSICAL EXAM General: NAD HEENT: Normal. Neck: +JVD, no thyromegaly. Lungs: Clear to auscultation bilaterally. CV: Nondisplaced PMI.  Regular rate and rhythm, normal S1/S2, no S3/S4, no murmur. Trace to 1+pretibial and periankle edema.   Abdomen: Soft, nontender.  Neurologic: Alert and oriented.  Psych: Normal affect. Skin:  Normal.  ECG: Most recent ECG reviewed.      ASSESSMENT AND PLAN: 1. CAD s/p PCI of LCx: Symptomatically stable. Continue Lipitor, Plavix, losartan, and labetalol. Normal LVEF as noted above.  2. Essential HTN: Controlled on present therapy. Continue labetalol, losartan, and amlodipine.  3. Hyperlipidemia: Lipids from 10/02/15 reviewed above. Continue Lipitor 20 mg daily and fish oil for elevated triglycerides.  4. Daytime somnolence and fatigue: I previously talked to her about a sleep study. She is willing to consider it.  5. PVD: Previously described claudication pain and I encouraged to make appt soon with vascular surgery.  6. Acute on chronic diastolic heart failure: I will increase Lasix to 40 mg twice daily for the next 4 days and also add metolazone 2.5 mg daily for 4 days. I will then reduce Lasix to 40 mg daily. She will continue with potassium supplementation. Check BMET on Monday. I will see her next week.  7. Atrial fibrillation: Currently in a regular rhythm. Continue Eliquis and labetalol.  Dispo: fu next week.  Time spent: 40 minutes, of which greater than 50% was spent reviewing symptoms, relevant blood tests and studies, and discussing management plan with the patient.   Kate Sable, M.D., F.A.C.C.

## 2015-10-07 ENCOUNTER — Other Ambulatory Visit: Payer: Self-pay | Admitting: Cardiovascular Disease

## 2015-10-07 DIAGNOSIS — Z79899 Other long term (current) drug therapy: Secondary | ICD-10-CM | POA: Diagnosis not present

## 2015-10-07 LAB — BASIC METABOLIC PANEL
BUN: 30 mg/dL — ABNORMAL HIGH (ref 7–25)
CALCIUM: 9.3 mg/dL (ref 8.6–10.4)
CO2: 25 mmol/L (ref 20–31)
CREATININE: 1.33 mg/dL — AB (ref 0.60–0.93)
Chloride: 96 mmol/L — ABNORMAL LOW (ref 98–110)
GLUCOSE: 174 mg/dL — AB (ref 65–99)
Potassium: 4 mmol/L (ref 3.5–5.3)
SODIUM: 137 mmol/L (ref 135–146)

## 2015-10-08 ENCOUNTER — Telehealth: Payer: Self-pay

## 2015-10-08 ENCOUNTER — Encounter: Payer: Self-pay | Admitting: Cardiovascular Disease

## 2015-10-08 ENCOUNTER — Ambulatory Visit (INDEPENDENT_AMBULATORY_CARE_PROVIDER_SITE_OTHER): Payer: PPO | Admitting: Cardiovascular Disease

## 2015-10-08 VITALS — BP 102/52 | HR 54 | Ht 63.0 in | Wt 144.0 lb

## 2015-10-08 DIAGNOSIS — Z79899 Other long term (current) drug therapy: Secondary | ICD-10-CM

## 2015-10-08 DIAGNOSIS — I480 Paroxysmal atrial fibrillation: Secondary | ICD-10-CM

## 2015-10-08 DIAGNOSIS — I25118 Atherosclerotic heart disease of native coronary artery with other forms of angina pectoris: Secondary | ICD-10-CM

## 2015-10-08 DIAGNOSIS — I252 Old myocardial infarction: Secondary | ICD-10-CM

## 2015-10-08 DIAGNOSIS — I739 Peripheral vascular disease, unspecified: Secondary | ICD-10-CM

## 2015-10-08 DIAGNOSIS — E785 Hyperlipidemia, unspecified: Secondary | ICD-10-CM

## 2015-10-08 DIAGNOSIS — I10 Essential (primary) hypertension: Secondary | ICD-10-CM | POA: Diagnosis not present

## 2015-10-08 DIAGNOSIS — I5031 Acute diastolic (congestive) heart failure: Secondary | ICD-10-CM

## 2015-10-08 MED ORDER — FUROSEMIDE 20 MG PO TABS
40.0000 mg | ORAL_TABLET | Freq: Every day | ORAL | Status: DC
Start: 2015-10-08 — End: 2016-02-20

## 2015-10-08 NOTE — Telephone Encounter (Signed)
Repeat lab work in 1 week due to reduction in diuretic. ( 10/14/15) Mailed pt a lab slip.

## 2015-10-08 NOTE — Patient Instructions (Signed)
   Decrease Lasix to 40mg  daily  Continue all other medications.   Follow up in  2 months

## 2015-10-08 NOTE — Progress Notes (Signed)
Patient ID: Virginia Huber, female   DOB: [birth year], 70 y.o.   MRN: [id number]      SUBJECTIVE: The patient presents for follow-up of acute on chronic diastolic heart failure. Labs on 4/17 showed BUN 30, creatinine 1.33. On 4/12, BUN was 21, creatinine 1.20. I ordered a repeat basic metabolic panel for next week.  Office scale 144 lbs (153 4/14). Home scale 144.5 lbs.  Her breathing has normalized and she no longer has leg swelling. Today she feels somewhat weak. She has avoided diet soft drinks until now.    Review of Systems: As per "subjective", otherwise negative.    Current Outpatient Prescriptions  Medication Sig Dispense Refill  . amLODipine (NORVASC) 10 MG tablet TAKE ONE (1) TABLET EACH DAY 90 tablet 3  . apixaban (ELIQUIS) 5 MG TABS tablet Take 1 tablet (5 mg total) by mouth 2 (two) times daily. 60 tablet 3  . atorvastatin (LIPITOR) 80 MG tablet Take 1 tablet (80 mg total) by mouth daily at 6 PM. 30 tablet 6  . cholecalciferol (VITAMIN D) 1000 UNITS tablet Take 1,000 Units by mouth at bedtime.     . clopidogrel (PLAVIX) 75 MG tablet Take 1 tablet (75 mg total) by mouth daily. 90 tablet 3  . furosemide (LASIX) 20 MG tablet Take 3 tablets (60 mg total) by mouth daily. 90 tablet 3  . insulin NPH-regular Human (NOVOLIN 70/30) (70-30) 100 UNIT/ML injection Inject 45 Units into the skin 2 (two) times daily.    [name] labetalol (NORMODYNE) 300 MG tablet Take 1 tablet (300 mg total) by mouth 2 (two) times daily. 180 tablet 2  . levothyroxine (SYNTHROID, LEVOTHROID) 150 MCG tablet Take 150 mcg by mouth every morning.    [name] losartan (COZAAR) 100 MG tablet TAKE ONE (1) TABLET BY MOUTH EVERY DAY 90 tablet 3  . metFORMIN (GLUCOPHAGE) 500 MG tablet Take 500 mg by mouth 3 (three) times daily with meals.     . nitroGLYCERIN (NITROSTAT) 0.4 MG SL tablet Place 1 tablet (0.4 mg total) under the tongue every 5 (five) minutes as needed for chest pain. 25 tablet 12  . Omega-3 Fatty Acids (FISH OIL)  1200 MG CAPS Take 1 capsule (1,200 mg total) by mouth daily.    . potassium chloride SA (KLOR-CON M20) 20 MEQ tablet Take 1 tablet (20 mEq total) by mouth daily. 90 tablet 3  . rOPINIRole (REQUIP) 0.5 MG tablet Take 0.5 mg by mouth at bedtime.   0   No current facility-administered medications for this visit.    Past Medical History  Diagnosis Date  . HTN (hypertension)   . Heart block   . Hypothyroidism   . Ovarian tumor   . Coronary artery disease 11/30/2014    a. remote MI. b. h/o PTCA with scoring balloon to OM1 11/2014. c. NSTEMI 03/2015 s/p DES to prox-mid Cx. d. NSTEMI 07/2015 s/p scoring balloon/PTCA/DES to dRCA with PAF during that admission  . UTI (urinary tract infection) 05/08/2013  . Superficial fungus infection of skin 06/29/2013  . TIA (transient ischemic attack) 08/2001; ~ 2006  . GERD (gastroesophageal reflux disease)   . Arthritis   . Hypercholesteremia   . Myocardial infarction ([place]) 02/2012  . Type II diabetes mellitus ([place])   . Cutaneous lupus erythematosus   . History of blood transfusion 1980's    2nd surgical procedures  . Pain with urination 05/08/2015  . Atrial fibrillation and flutter ([place])     a. h/o PAF/flutter during admission in 2013  for PNA. b. PAF during adm for NSTEMI 07/2015.  [name] PAD (peripheral artery disease) ([place])     a. s/p LE angio 2015; followed by Dr. Fletcher Anon - managed medically.  . B12 deficiency anemia     Past Surgical History  Procedure Laterality Date  . 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 Partial hysterectomy  1970's    left ovaries, then ovaries removed later due tumors   . Left oophorectomy  05/1979    nicked bowel, peritonitis, colostomy; colostomy reversed 1981   . Nuclear med stress test  10/2011    Small area of mild ischemia inferoapically.  . Abdominal aortagram N/A 01/03/2014    Procedure: ABDOMINAL [name];  Surgeon: Wellington Hampshire, MD;  Location[facility] CATH LAB;  Service: Cardiovascular;  Laterality: N/A;  . Lower extremity angiogram N/A  01/03/2014    Procedure: LOWER EXTREMITY ANGIOGRAM;  Surgeon: Wellington Hampshire, MD;  Location: [place] CATH LAB;  Service: Cardiovascular;  Laterality: N/A;  . Colostomy reversal  11/1979  . Colonoscopy  2005    Dr. Laural Golden: pancolonic divericula, polyp, path unknown currently  . Appendectomy  1970's  . Cholecystectomy open  1990's  . Excisional hemorrhoidectomy  1970's  . Abdominal hysterectomy  1972    "partial"  . 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 Eye surgery Left ~ 2001    "smoothed out wrinkle"  . Cardiac catheterization  2008    Tiny OM-2 with 90% narrowing. Med tx.  . Cardiac catheterization N/A 11/30/2014    Procedure: Left Heart Cath and Coronary Angiography;  Surgeon: Troy Sine, MD; LAD 20%, CFX 50%, OM1 95%, right PLB 30%, LV normal   . Cardiac catheterization N/A 11/30/2014    Procedure: Coronary Balloon Angioplasty;  Surgeon: Troy Sine, MD;  Angiosculpt scoring balloon and PTCA to the OM1 reducing stenosis from 95% to less than 10%  . Right oophorectomy  1970's  . Cardiac catheterization N/A 04/03/2015    Procedure: Left Heart Cath and Coronary Angiography;  Surgeon: Jolaine Artist, MD; dLAD 50%, CFX 90%, OM1 100%, PLA 15%, LVEDP 13    . Cardiac catheterization N/A 04/03/2015    Procedure: Coronary Stent Intervention;  Surgeon: Sherren Mocha, MD; 3.0x18 mm Xience DES to the CFX    . Cardiac catheterization N/A 08/02/2015    Procedure: Left Heart Cath and Coronary Angiography;  Surgeon: Troy Sine, MD;  Location: [place] CV LAB;  Service: Cardiovascular;  Laterality: N/A;  . Cardiac catheterization N/A 08/02/2015    Procedure: Coronary Stent Intervention;  Surgeon: Troy Sine, MD;  Location: [place] CV LAB;  Service: Cardiovascular;  Laterality: N/A;  . Cardiac stents      Social History   Social History  . Marital Status: Married    Spouse Name: N/A  . Number of Children: N/A  . Years of Education: N/A   Occupational History    . Retired     Research officer, political party   Social History Main Topics  . Smoking status: Never Smoker   . Smokeless tobacco: Never Used  . Alcohol Use: No  . Drug Use: No  . Sexual Activity: No     Comment: hyst   Other Topics Concern  . Not on file   Social History Narrative     Filed Vitals:   10/08/15 1301  BP: 102/52  Pulse: 54  Height: 5\' 3"  (1.6 m)  Weight: 144 lb (65.318 kg)  SpO2: 98%  PHYSICAL EXAM General: NAD HEENT: Normal. Neck: No JVD, no thyromegaly. Lungs: Clear to auscultation bilaterally with normal respiratory effort. CV: Nondisplaced PMI.  Regular rate and rhythm, normal S1/S2, no S3/S4, no murmur. No pretibial or periankle edema.     Abdomen: Soft, nontender, no distention.  Neurologic: Alert and oriented.  Psych: Normal affect. Skin: Normal. Musculoskeletal: No gross deformities.  ECG: Most recent ECG reviewed.      ASSESSMENT AND PLAN: 1. CAD s/p PCI of LCx: Symptomatically stable. Continue Lipitor, Plavix, losartan, and labetalol. Normal LVEF as noted above.  2. Essential HTN: Controlled on present therapy. Continue labetalol, losartan, and amlodipine.  3. Hyperlipidemia: Lipids from 10/02/15 previously reviewed. Continue Lipitor 20 mg daily and fish oil for elevated triglycerides.  4. Daytime somnolence and fatigue: I previously talked to her about a sleep study. She is willing to consider it.  5. PVD: Previously described claudication pain and I encouraged to make appt soon with vascular surgery.  6. Acute on chronic diastolic heart failure: Wt down on office scales 9 lbs. Continue Lasix 40 mg daily. She will continue with potassium supplementation. Check BMET next week. Encouraged to avoid diet soft drinks and sodium rich foods.  7. Atrial fibrillation: Currently in a regular rhythm. Continue Eliquis and labetalol.  Dispo: fu 2 months.   Kate Sable, M.D., F.A.C.C.

## 2015-10-08 NOTE — Telephone Encounter (Signed)
-----   Message from Merlene Laughter, LPN sent at [id number]  9:43 AM EDT ----- Regarding: Linna Hoff patient   ----- Message -----    From: Herminio Commons, MD    Sent: 10/08/2015   9:39 AM      To: Merlene Laughter, LPN  Repeat in 1 week given diuretic dose reduction.

## 2015-10-08 NOTE — Addendum Note (Signed)
Addended by: Debbora Lacrosse R on: 10/08/2015 10:34 AM   Modules accepted: Orders

## 2015-10-14 ENCOUNTER — Other Ambulatory Visit: Payer: Self-pay | Admitting: Cardiovascular Disease

## 2015-10-14 DIAGNOSIS — Z79899 Other long term (current) drug therapy: Secondary | ICD-10-CM | POA: Diagnosis not present

## 2015-10-14 LAB — BASIC METABOLIC PANEL
BUN: 18 mg/dL (ref 7–25)
CHLORIDE: 106 mmol/L (ref 98–110)
CO2: 24 mmol/L (ref 20–31)
CREATININE: 0.96 mg/dL — AB (ref 0.60–0.93)
Calcium: 8.2 mg/dL — ABNORMAL LOW (ref 8.6–10.4)
Glucose, Bld: 197 mg/dL — ABNORMAL HIGH (ref 65–99)
POTASSIUM: 4.4 mmol/L (ref 3.5–5.3)
Sodium: 139 mmol/L (ref 135–146)

## 2015-10-16 ENCOUNTER — Telehealth: Payer: Self-pay | Admitting: *Deleted

## 2015-10-16 NOTE — Telephone Encounter (Signed)
Notes Recorded by Laurine Blazer, LPN on [id number] at [id number] AM Patient notified. Copy to pmd.

## 2015-10-16 NOTE — Telephone Encounter (Signed)
-----   Message from Dexter sent at 10/15/2015  8:57 AM EDT -----   ----- Message -----    From: Herminio Commons, MD    Sent: 10/15/2015   8:52 AM      To: Massie Maroon, CMA  Renal function improved.

## 2015-10-28 ENCOUNTER — Encounter (HOSPITAL_COMMUNITY): Payer: Self-pay

## 2015-10-28 ENCOUNTER — Emergency Department (HOSPITAL_COMMUNITY): Payer: PPO

## 2015-10-28 ENCOUNTER — Inpatient Hospital Stay (HOSPITAL_COMMUNITY)
Admission: EM | Admit: 2015-10-28 | Discharge: 2015-10-29 | DRG: 309 | Disposition: A | Discharge status: Home / Self Care | Payer: PPO | Attending: Internal Medicine | Admitting: Internal Medicine

## 2015-10-28 DIAGNOSIS — E1121 Type 2 diabetes mellitus with diabetic nephropathy: Secondary | ICD-10-CM | POA: Diagnosis present

## 2015-10-28 DIAGNOSIS — Z7902 Long term (current) use of antithrombotics/antiplatelets: Secondary | ICD-10-CM

## 2015-10-28 DIAGNOSIS — Z90710 Acquired absence of both cervix and uterus: Secondary | ICD-10-CM | POA: Diagnosis not present

## 2015-10-28 DIAGNOSIS — N182 Chronic kidney disease, stage 2 (mild): Secondary | ICD-10-CM

## 2015-10-28 DIAGNOSIS — I2510 Atherosclerotic heart disease of native coronary artery without angina pectoris: Secondary | ICD-10-CM | POA: Diagnosis not present

## 2015-10-28 DIAGNOSIS — R079 Chest pain, unspecified: Secondary | ICD-10-CM

## 2015-10-28 DIAGNOSIS — Z79899 Other long term (current) drug therapy: Secondary | ICD-10-CM

## 2015-10-28 DIAGNOSIS — Z794 Long term (current) use of insulin: Secondary | ICD-10-CM

## 2015-10-28 DIAGNOSIS — R0600 Dyspnea, unspecified: Secondary | ICD-10-CM | POA: Diagnosis not present

## 2015-10-28 DIAGNOSIS — E785 Hyperlipidemia, unspecified: Secondary | ICD-10-CM | POA: Diagnosis not present

## 2015-10-28 DIAGNOSIS — I739 Peripheral vascular disease, unspecified: Secondary | ICD-10-CM | POA: Diagnosis present

## 2015-10-28 DIAGNOSIS — I110 Hypertensive heart disease with heart failure: Secondary | ICD-10-CM | POA: Diagnosis present

## 2015-10-28 DIAGNOSIS — I5032 Chronic diastolic (congestive) heart failure: Secondary | ICD-10-CM | POA: Diagnosis present

## 2015-10-28 DIAGNOSIS — E1165 Type 2 diabetes mellitus with hyperglycemia: Secondary | ICD-10-CM

## 2015-10-28 DIAGNOSIS — R0789 Other chest pain: Secondary | ICD-10-CM | POA: Diagnosis not present

## 2015-10-28 DIAGNOSIS — I10 Essential (primary) hypertension: Secondary | ICD-10-CM | POA: Diagnosis present

## 2015-10-28 DIAGNOSIS — E1122 Type 2 diabetes mellitus with diabetic chronic kidney disease: Secondary | ICD-10-CM

## 2015-10-28 DIAGNOSIS — Z833 Family history of diabetes mellitus: Secondary | ICD-10-CM

## 2015-10-28 DIAGNOSIS — I252 Old myocardial infarction: Secondary | ICD-10-CM | POA: Diagnosis not present

## 2015-10-28 DIAGNOSIS — L930 Discoid lupus erythematosus: Secondary | ICD-10-CM | POA: Diagnosis present

## 2015-10-28 DIAGNOSIS — Z8249 Family history of ischemic heart disease and other diseases of the circulatory system: Secondary | ICD-10-CM | POA: Diagnosis not present

## 2015-10-28 DIAGNOSIS — E039 Hypothyroidism, unspecified: Secondary | ICD-10-CM | POA: Diagnosis present

## 2015-10-28 DIAGNOSIS — E7800 Pure hypercholesterolemia, unspecified: Secondary | ICD-10-CM | POA: Diagnosis not present

## 2015-10-28 DIAGNOSIS — K219 Gastro-esophageal reflux disease without esophagitis: Secondary | ICD-10-CM | POA: Diagnosis present

## 2015-10-28 DIAGNOSIS — I480 Paroxysmal atrial fibrillation: Secondary | ICD-10-CM | POA: Diagnosis not present

## 2015-10-28 DIAGNOSIS — Z8673 Personal history of transient ischemic attack (TIA), and cerebral infarction without residual deficits: Secondary | ICD-10-CM | POA: Diagnosis not present

## 2015-10-28 DIAGNOSIS — Z7901 Long term (current) use of anticoagulants: Secondary | ICD-10-CM | POA: Diagnosis not present

## 2015-10-28 DIAGNOSIS — Z933 Colostomy status: Secondary | ICD-10-CM

## 2015-10-28 DIAGNOSIS — I4891 Unspecified atrial fibrillation: Secondary | ICD-10-CM | POA: Diagnosis present

## 2015-10-28 DIAGNOSIS — E782 Mixed hyperlipidemia: Secondary | ICD-10-CM | POA: Diagnosis present

## 2015-10-28 DIAGNOSIS — E1159 Type 2 diabetes mellitus with other circulatory complications: Secondary | ICD-10-CM | POA: Diagnosis present

## 2015-10-28 LAB — GLUCOSE, CAPILLARY
Glucose-Capillary: 286 mg/dL — ABNORMAL HIGH (ref 65–99)
Glucose-Capillary: 154 mg/dL — ABNORMAL HIGH (ref 65–99)

## 2015-10-28 LAB — TROPONIN I: Troponin I: <0.03 ng/mL (ref <0.031)

## 2015-10-28 LAB — BASIC METABOLIC PANEL
ANION GAP: 10 (ref 5–15)
BUN: 16 mg/dL (ref 6–20)
CHLORIDE: 108 mmol/L (ref 101–111)
CO2: 23 mmol/L (ref 22–32)
CREATININE: 0.85 mg/dL (ref 0.44–1.00)
Calcium: 8.9 mg/dL (ref 8.9–10.3)
GFR calc non Af Amer: >60 mL/min (ref >60)
Glucose, Bld: 236 mg/dL — ABNORMAL HIGH (ref 65–99)
POTASSIUM: 3.8 mmol/L (ref 3.5–5.1)
SODIUM: 141 mmol/L (ref 135–145)

## 2015-10-28 LAB — PROTIME-INR
INR: 1.2 (ref 0.00–1.49)
PROTHROMBIN TIME: 15.3 s — AB (ref 11.6–15.2)

## 2015-10-28 LAB — CBC
HCT: 33.9 % — ABNORMAL LOW (ref 36.0–46.0)
HEMOGLOBIN: 11.1 g/dL — AB (ref 12.0–15.0)
MCH: 30.2 pg (ref 26.0–34.0)
MCHC: 32.7 g/dL (ref 30.0–36.0)
MCV: 92.4 fL (ref 78.0–100.0)
PLATELETS: 213 10*3/uL (ref 150–400)
RBC: 3.67 MIL/uL — AB (ref 3.87–5.11)
RDW: 14.7 % (ref 11.5–15.5)
WBC: 7.9 10*3/uL (ref 4.0–10.5)

## 2015-10-28 LAB — TSH: TSH: 0.2 u[IU]/mL — AB (ref 0.350–4.500)

## 2015-10-28 LAB — MRSA PCR SCREENING: MRSA BY PCR: NEGATIVE

## 2015-10-28 MED ORDER — DILTIAZEM HCL 100 MG IV SOLR
5.0000 mg/h | INTRAVENOUS | Status: DC
  Administered 2015-10-28: 5 mg/h via INTRAVENOUS
  Administered 2015-10-28: 10 mg/h via INTRAVENOUS
  Filled 2015-10-28 (×2): qty 100

## 2015-10-28 MED ORDER — INSULIN ASPART 100 UNIT/ML ~~LOC~~ SOLN: 0.0000 [IU] | Freq: Every day | SUBCUTANEOUS | Status: DC

## 2015-10-28 MED ORDER — FUROSEMIDE 40 MG PO TABS
40.0000 mg | ORAL_TABLET | Freq: Every day | ORAL | Status: DC
  Administered 2015-10-29: 40 mg via ORAL
  Filled 2015-10-28: qty 1

## 2015-10-28 MED ORDER — LABETALOL HCL 200 MG PO TABS
300.0000 mg | ORAL_TABLET | Freq: Two times a day (BID) | ORAL | Status: DC
Start: 2015-10-28 — End: 2015-10-28

## 2015-10-28 MED ORDER — FENTANYL CITRATE (PF) 100 MCG/2ML IJ SOLN
25.0000 ug | Freq: Once | INTRAMUSCULAR | Status: AC
  Administered 2015-10-28: 25 ug via INTRAVENOUS
  Filled 2015-10-28: qty 2

## 2015-10-28 MED ORDER — SODIUM CHLORIDE 0.9% FLUSH: 3.0000 mL | Freq: Two times a day (BID) | INTRAVENOUS | Status: DC

## 2015-10-28 MED ORDER — ROPINIROLE HCL 0.25 MG PO TABS
0.5000 mg | ORAL_TABLET | Freq: Every day | ORAL | Status: DC
  Administered 2015-10-28: 0.5 mg via ORAL
  Filled 2015-10-28 (×2): qty 2

## 2015-10-28 MED ORDER — POTASSIUM CHLORIDE CRYS ER 20 MEQ PO TBCR
20.0000 meq | EXTENDED_RELEASE_TABLET | Freq: Every day | ORAL | Status: DC
  Administered 2015-10-29: 20 meq via ORAL
  Filled 2015-10-28: qty 1

## 2015-10-28 MED ORDER — ASPIRIN 81 MG PO CHEW
81.0000 mg | CHEWABLE_TABLET | ORAL | Status: AC
  Administered 2015-10-29: 81 mg via ORAL
  Filled 2015-10-28: qty 1

## 2015-10-28 MED ORDER — INSULIN ASPART 100 UNIT/ML ~~LOC~~ SOLN
0.0000 [IU] | Freq: Three times a day (TID) | SUBCUTANEOUS | Status: DC
  Administered 2015-10-28: 5 [IU] via SUBCUTANEOUS
  Administered 2015-10-29: 2 [IU] via SUBCUTANEOUS

## 2015-10-28 MED ORDER — ATORVASTATIN CALCIUM 40 MG PO TABS
80.0000 mg | ORAL_TABLET | Freq: Every day | ORAL | Status: DC
  Administered 2015-10-28: 80 mg via ORAL
  Filled 2015-10-28: qty 2

## 2015-10-28 MED ORDER — ACETAMINOPHEN 325 MG PO TABS
650.0000 mg | ORAL_TABLET | ORAL | Status: DC | PRN
  Administered 2015-10-28: 650 mg via ORAL
  Filled 2015-10-28: qty 2

## 2015-10-28 MED ORDER — CLOPIDOGREL BISULFATE 75 MG PO TABS
75.0000 mg | ORAL_TABLET | Freq: Every day | ORAL | Status: DC
  Administered 2015-10-29: 75 mg via ORAL
  Filled 2015-10-28: qty 1

## 2015-10-28 MED ORDER — DILTIAZEM LOAD VIA INFUSION
10.0000 mg | Freq: Once | INTRAVENOUS | Status: AC
  Administered 2015-10-28: 10 mg via INTRAVENOUS
  Filled 2015-10-28: qty 10

## 2015-10-28 MED ORDER — INSULIN ASPART PROT & ASPART (70-30 MIX) 100 UNIT/ML ~~LOC~~ SUSP
45.0000 [IU] | Freq: Two times a day (BID) | SUBCUTANEOUS | Status: DC
  Administered 2015-10-28 – 2015-10-29 (×2): 45 [IU] via SUBCUTANEOUS
  Filled 2015-10-28: qty 10

## 2015-10-28 MED ORDER — NITROGLYCERIN 2 % TD OINT
1.0000 [in_us] | TOPICAL_OINTMENT | Freq: Once | TRANSDERMAL | Status: AC
  Administered 2015-10-28: 1 [in_us] via TOPICAL

## 2015-10-28 MED ORDER — LABETALOL HCL 200 MG PO TABS
400.0000 mg | ORAL_TABLET | Freq: Two times a day (BID) | ORAL | Status: DC
  Administered 2015-10-28 – 2015-10-29 (×2): 400 mg via ORAL
  Filled 2015-10-28 (×3): qty 2

## 2015-10-28 MED ORDER — SODIUM CHLORIDE 0.9 % IV SOLN: INTRAVENOUS | Status: DC

## 2015-10-28 MED ORDER — SODIUM CHLORIDE 0.9 % IV SOLN: 250.0000 mL | INTRAVENOUS | Status: DC | PRN

## 2015-10-28 MED ORDER — LEVOTHYROXINE SODIUM 75 MCG PO TABS
150.0000 ug | ORAL_TABLET | Freq: Every morning | ORAL | Status: DC
  Administered 2015-10-29: 150 ug via ORAL
  Filled 2015-10-28: qty 2

## 2015-10-28 MED ORDER — APIXABAN 5 MG PO TABS
5.0000 mg | ORAL_TABLET | Freq: Two times a day (BID) | ORAL | Status: DC
  Administered 2015-10-28 – 2015-10-29 (×2): 5 mg via ORAL
  Filled 2015-10-28 (×2): qty 1

## 2015-10-28 MED ORDER — NITROGLYCERIN 2 % TD OINT
TOPICAL_OINTMENT | TRANSDERMAL | Status: AC
  Filled 2015-10-28: qty 1

## 2015-10-28 MED ORDER — ONDANSETRON HCL 4 MG/2ML IJ SOLN: 4.0000 mg | Freq: Four times a day (QID) | INTRAMUSCULAR | Status: DC | PRN

## 2015-10-28 MED ORDER — SODIUM CHLORIDE 0.9% FLUSH: 3.0000 mL | INTRAVENOUS | Status: DC | PRN

## 2015-10-28 NOTE — ED Notes (Signed)
Pt continues to c/o 9/10 chest pain; EDP made aware and orders given to give fentanyl

## 2015-10-28 NOTE — ED Notes (Signed)
Pt rating chest pain 9/10, EDP made aware and orders given for nitro paste

## 2015-10-28 NOTE — ED Notes (Signed)
Woke up around 0330 and I was in A-fib.  My hear rate was 128 per pt. I was having chest pain and took two nitro per pt.   Nitro at 0355 and 0410. Having tightness and chest pain at this time.

## 2015-10-28 NOTE — Progress Notes (Signed)
Patient's 02 saturations dropped to 86% while sleeping. Patient placed on [facility].

## 2015-10-28 NOTE — ED Provider Notes (Signed)
CSN: [id number]     Arrival date & time 10/28/15  0426 History   First MD Initiated Contact with Patient 10/28/15 [phone]     Chief Complaint  Patient presents with  . Atrial Fibrillation     (Consider location/radiation/quality/duration/timing/severity/associated sxs/prior Treatment) HPI patient reports about 3:30 this morning she woke up with chest pain and feeling like her heart was pounding fast. She relates she had pain in her upper central chest. She states she's had this pain before when she has atrial fibrillation and she has had MI 3 before. She reports showed a cardiac stent put in October and in February. She states she has some blockages that are not reachable to be stented and several other blockage that are too small to stent. She denies nausea or vomiting but did have some diaphoresis. She states she's had atrial fibrillation several times in the past and they normally convert her with medications. She has never had to be cardioverted. She is currently on Eliquis and Plavix.  PCP Dr [name] Cardiology Dr Jacinta Shoe  Past Medical History  Diagnosis Date  . HTN (hypertension)   . Heart block   . Hypothyroidism   . Ovarian tumor   . Coronary artery disease 11/30/2014    a. remote MI. b. h/o PTCA with scoring balloon to OM1 11/2014. c. NSTEMI 03/2015 s/p DES to prox-mid Cx. d. NSTEMI 07/2015 s/p scoring balloon/PTCA/DES to dRCA with PAF during that admission  . UTI (urinary tract infection) 05/08/2013  . Superficial fungus infection of skin 06/29/2013  . TIA (transient ischemic attack) 08/2001; ~ 2006  . GERD (gastroesophageal reflux disease)   . Arthritis   . Hypercholesteremia   . Myocardial infarction ([place]) 02/2012  . Type II diabetes mellitus ([place])   . Cutaneous lupus erythematosus   . History of blood transfusion 1980's    2nd surgical procedures  . Pain with urination 05/08/2015  . Atrial fibrillation and flutter ([place])     a. h/o PAF/flutter during admission in 2013 for  PNA. b. PAF during adm for NSTEMI 07/2015.  [name] PAD (peripheral artery disease) ([place])     a. s/p LE angio 2015; followed by Dr. Fletcher Anon - managed medically.  . B12 deficiency anemia    Past Surgical History  Procedure Laterality Date  . 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 Partial hysterectomy  1970's    left ovaries, then ovaries removed later due tumors   . Left oophorectomy  05/1979    nicked bowel, peritonitis, colostomy; colostomy reversed 1981   . Nuclear med stress test  10/2011    Small area of mild ischemia inferoapically.  . Abdominal aortagram N/A 01/03/2014    Procedure: ABDOMINAL [name];  Surgeon: Wellington Hampshire, MD;  Location: [facility] CATH LAB;  Service: Cardiovascular;  Laterality: N/A;  . Lower extremity angiogram N/A 01/03/2014    Procedure: LOWER EXTREMITY ANGIOGRAM;  Surgeon: Wellington Hampshire, MD;  Location: [place] CATH LAB;  Service: Cardiovascular;  Laterality: N/A;  . Colostomy reversal  11/1979  . Colonoscopy  2005    Dr. Laural Golden: pancolonic divericula, polyp, path unknown currently  . Appendectomy  1970's  . Cholecystectomy open  1990's  . Excisional hemorrhoidectomy  1970's  . Abdominal hysterectomy  1972    "partial"  . 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 Eye surgery Left ~ 2001    "smoothed out wrinkle"  . Cardiac catheterization  2008    Tiny OM-2 with 90% narrowing. Med tx.  [name]  Cardiac catheterization N/A 11/30/2014    Procedure: Left Heart Cath and Coronary Angiography;  Surgeon: Troy Sine, MD; LAD 20%, CFX 50%, OM1 95%, right PLB 30%, LV normal   . Cardiac catheterization N/A 11/30/2014    Procedure: Coronary Balloon Angioplasty;  Surgeon: Troy Sine, MD;  Angiosculpt scoring balloon and PTCA to the OM1 reducing stenosis from 95% to less than 10%  . Right oophorectomy  1970's  . Cardiac catheterization N/A 04/03/2015    Procedure: Left Heart Cath and Coronary Angiography;  Surgeon: Jolaine Artist, MD; dLAD 50%, CFX 90%, OM1 100%, PLA 15%, LVEDP 13     . Cardiac catheterization N/A 04/03/2015    Procedure: Coronary Stent Intervention;  Surgeon: Sherren Mocha, MD; 3.0x18 mm Xience DES to the CFX    . Cardiac catheterization N/A 08/02/2015    Procedure: Left Heart Cath and Coronary Angiography;  Surgeon: Troy Sine, MD;  Location: [place] CV LAB;  Service: Cardiovascular;  Laterality: N/A;  . Cardiac catheterization N/A 08/02/2015    Procedure: Coronary Stent Intervention;  Surgeon: Troy Sine, MD;  Location: [place] CV LAB;  Service: Cardiovascular;  Laterality: N/A;  . Cardiac stents     Family History  Problem Relation Age of Onset  . Heart disease Mother     deceased  . Heart disease Father     deceased, heart disease  . Colon cancer Neg Hx   . Diabetes Brother   . Heart disease Brother   . Thyroid disease Brother   . Heart disease Sister   . Heart disease Brother   . Thyroid disease Brother   . Lupus Daughter    Social History  Substance Use Topics  . Smoking status: Never Smoker   . Smokeless tobacco: Never Used  . Alcohol Use: No  lives at home Lives with spouse  OB History    Gravida Para Term Preterm AB TAB SAB Ectopic Multiple Living   3 2   1  1   2      Review of Systems  All other systems reviewed and are negative.   Allergies  Penicillins and Percocet  Home Medications   Prior to Admission medications   Medication Sig Start Date End Date Taking? Authorizing Provider  amLODipine (NORVASC) 10 MG tablet TAKE ONE (1) TABLET EACH DAY 07/26/15  Yes Herminio Commons, MD  apixaban (ELIQUIS) 5 MG TABS tablet Take 1 tablet (5 mg total) by mouth 2 (two) times daily. 08/09/15  Yes Dayna N Dunn, PA-C  atorvastatin (LIPITOR) 80 MG tablet Take 1 tablet (80 mg total) by mouth daily at 6 PM. 08/03/15  Yes Bhavinkumar Bhagat, PA  cholecalciferol (VITAMIN D) 1000 UNITS tablet Take 1,000 Units by mouth at bedtime.    Yes Historical Provider, MD  clopidogrel (PLAVIX) 75 MG tablet Take 1 tablet (75 mg total)  by mouth daily. 08/09/15  Yes Dayna N Dunn, PA-C  furosemide (LASIX) 20 MG tablet Take 2 tablets (40 mg total) by mouth daily. 10/08/15  Yes Herminio Commons, MD  insulin NPH-regular Human (NOVOLIN 70/30) (70-30) 100 UNIT/ML injection Inject 45 Units into the skin 2 (two) times daily.   Yes Historical Provider, MD  labetalol (NORMODYNE) 300 MG tablet Take 1 tablet (300 mg total) by mouth 2 (two) times daily. 05/02/15  Yes Herminio Commons, MD  levothyroxine (SYNTHROID, LEVOTHROID) 150 MCG tablet Take 150 mcg by mouth every morning.   Yes Historical Provider, MD  losartan (COZAAR) 100 MG  tablet TAKE ONE (1) TABLET BY MOUTH EVERY DAY 06/10/15  Yes Herminio Commons, MD  metFORMIN (GLUCOPHAGE) 500 MG tablet Take 500 mg by mouth 3 (three) times daily with meals.    Yes Historical Provider, MD  nitroGLYCERIN (NITROSTAT) 0.4 MG SL tablet Place 1 tablet (0.4 mg total) under the tongue every 5 (five) minutes as needed for chest pain. 04/04/15  Yes Rhonda G Barrett, PA-C  Omega-3 Fatty Acids (FISH OIL) 1200 MG CAPS Take 1 capsule (1,200 mg total) by mouth daily. 02/13/15  Yes Herminio Commons, MD  potassium chloride SA (KLOR-CON M20) 20 MEQ tablet Take 1 tablet (20 mEq total) by mouth daily. 10/02/15  Yes Isaiah Serge, NP  rOPINIRole (REQUIP) 0.5 MG tablet Take 0.5 mg by mouth at bedtime.  03/05/14  Yes Historical Provider, MD   BP 121/74 mmHg  Pulse 118  Temp(Src) 97.9 F (36.6 C) (Oral)  Resp 19  Ht 5\' 3"  (1.6 m)  Wt 150 lb (68.04 kg)  BMI 26.58 kg/m2  SpO2 98%  Vital signs normal except tachycardia   Physical Exam  Constitutional: She is oriented to person, place, and time. She appears well-developed and well-nourished.  Non-toxic appearance. She does not appear ill. No distress.  HENT:  Head: Normocephalic and atraumatic.  Right Ear: External ear normal.  Left Ear: External ear normal.  Nose: Nose normal. No mucosal edema or rhinorrhea.  Mouth/Throat: Oropharynx is clear and moist  and mucous membranes are normal. No dental abscesses or uvula swelling.  Eyes: Conjunctivae and EOM are normal. Pupils are equal, round, and reactive to light.  Neck: Normal range of motion and full passive range of motion without pain. Neck supple.  Cardiovascular: Normal heart sounds.  An irregularly irregular rhythm present. Tachycardia present.  Exam reveals no gallop and no friction rub.   No murmur heard. Pulmonary/Chest: Effort normal and breath sounds normal. No respiratory distress. She has no wheezes. She has no rhonchi. She has no rales. She exhibits no tenderness and no crepitus.  Abdominal: Soft. Normal appearance and bowel sounds are normal. She exhibits no distension. There is no tenderness. There is no rebound and no guarding.  Musculoskeletal: Normal range of motion. She exhibits no edema or tenderness.  Moves all extremities well.   Neurological: She is alert and oriented to person, place, and time. She has normal strength. No cranial nerve deficit.  Skin: Skin is warm, dry and intact. No rash noted. No erythema. No pallor.  Psychiatric: She has a normal mood and affect. Her speech is normal and behavior is normal. Her mood appears not anxious.  Nursing note and vitals reviewed.   ED Course  Procedures (including critical care time)  Medications  diltiazem (CARDIZEM) 1 mg/mL load via infusion 10 mg (10 mg Intravenous Given 10/28/15 0528)    And  diltiazem (CARDIZEM) 100 mg in dextrose 5 % 100 mL (1 mg/mL) infusion (15 mg/hr Intravenous Rate/Dose Change 10/28/15 0608)  nitroGLYCERIN (NITROGLYN) 2 % ointment 1 inch (1 inch Topical Given 10/28/15 0533)  fentaNYL (SUBLIMAZE) injection 25 mcg (25 mcg Intravenous Given 10/28/15 0604)    I reviewed patient's chart and according to her last cardiology note she converted on cardizem after about 6 hours. She was given a Cardizem bolus and started on a Cardizem drip. She continued to complain of pain. I gave her a bolus of 500 mL of normal  saline and then gave her nitroglycerin paste. I was afraid to start both at the same  time for fear of causing hypotension. She continued to complain of pain and then was given fentanyl 25 micrograms IV.  6:20 AM patient is currently on Cardizem at 15 mg per hour. Her heart rate is still around 120. In going to speak to the hospitalist about admission. She states last time she was admitted she stayed at [facility]. We discussed that she would  need to beadmitted again today and she is agreeable.  06:34 Dr Darrick Meigs, admit to step down   Labs Review Results for orders placed or performed during the hospital encounter of [id number]  Basic metabolic panel  Result Value Ref Range   Sodium 141 135 - 145 mmol/L   Potassium 3.8 3.5 - 5.1 mmol/L   Chloride 108 101 - 111 mmol/L   CO2 23 22 - 32 mmol/L   Glucose, Bld 236 (H) 65 - 99 mg/dL   BUN 16 6 - 20 mg/dL   Creatinine, Ser 0.85 0.44 - 1.00 mg/dL   Calcium 8.9 8.9 - 10.3 mg/dL   GFR calc non Af Amer >60 >60 mL/min   GFR calc Af Amer >60 >60 mL/min   Anion gap 10 5 - 15  CBC  Result Value Ref Range   WBC 7.9 4.0 - 10.5 K/uL   RBC 3.67 (L) 3.87 - 5.11 MIL/uL   Hemoglobin 11.1 (L) 12.0 - 15.0 g/dL   HCT 33.9 (L) 36.0 - 46.0 %   MCV 92.4 78.0 - 100.0 fL   MCH 30.2 26.0 - 34.0 pg   MCHC 32.7 30.0 - 36.0 g/dL   RDW 14.7 11.5 - 15.5 %   Platelets 213 150 - 400 K/uL  Protime-INR - (order if Patient is taking Coumadin / Warfarin)  Result Value Ref Range   Prothrombin Time 15.3 (H) 11.6 - 15.2 seconds   INR 1.20 0.00 - 1.49  Troponin I  Result Value Ref Range   Troponin I <0.03 <0.031 ng/mL   Laboratory interpretation all normal except mild anemia, hyperglycemia   Imaging Review Dg Chest Portable 1 View  10/28/2015  CLINICAL DATA:  Chest pain and midsternal pressure. Dyspnea. Onset tonight. EXAM: PORTABLE CHEST 1 VIEW COMPARISON:  10/02/2015. FINDINGS: A single AP portable view of the chest demonstrates no focal airspace consolidation or  alveolar edema. The lungs are grossly clear. There is no large effusion or pneumothorax. Cardiac and mediastinal contours appear unremarkable. IMPRESSION: No active disease. Electronically Signed   By: Andreas Newport M.D.   On: 10/28/2015 05:15   I have personally reviewed and evaluated these images and lab results as part of my medical decision-making.   EKG Interpretation   Date/Time:  Monday Oct 28 2015 04:41:01 EDT Ventricular Rate:  117 PR Interval:    QRS Duration: 96 QT Interval:  339 QTC Calculation: 473 R Axis:   32 Text Interpretation:  Atrial fibrillation LVH with secondary  repolarization abnormality Since last tracing 17 Sep 2015 Atrial  fibrillation has replaced Normal sinus rhythm Confirmed by Ariane Ditullio  MD-I,  Kiran Carline ([id number]) on 10/28/2015 4:47:44 AM      MDM   patient presents with history of coronary artery disease and paroxysmal atrial fibrillation. She had acute onset atrial fibrillation and chest pain at 1:30 this morning. She was started on Cardizem which is what they have used in the past to cardiovert her. She continues to have atrial fibrillation with rapid ventricular response. Her initial cardiac enzymes are negative. She was admitted.    Final diagnoses:  Atrial fibrillation with  rapid ventricular response (HCC)  Chest pain, unspecified chest pain type   Plan admission  Rolland Porter, MD, [place] Performed by: Nichlas Pitera L Chief Walkup Total critical care time: 40 minutes Critical care time was exclusive of separately billable procedures and treating other patients. Critical care was necessary to treat or prevent imminent or life-threatening deterioration. Critical care was time spent personally by me on the following activities: development of treatment plan with patient and/or surrogate as well as nursing, discussions with consultants, evaluation of patient's response to treatment, examination of patient, obtaining history from patient or surrogate, ordering and  performing treatments and interventions, ordering and review of laboratory studies, ordering and review of radiographic studies, pulse oximetry and re-evaluation of patient's condition.     Rolland Porter, MD 10/28/15 [phone]

## 2015-10-28 NOTE — Progress Notes (Signed)
Patient on dilt gtt already back in NSR, we will increase her labetolol to 400mg  bid and wean dilt gtt overnight. Full consult to follow tomorrow AM.  Zandra Abts MD

## 2015-10-28 NOTE — Progress Notes (Signed)
Cardiologist ordered to wean parameters for cardizem.  Notified MD that gtt had been off since 1235. New orders received and carried out.

## 2015-10-28 NOTE — Progress Notes (Signed)
Patient had a 20 second pause at 1227 and converted to sinus bradycardia in the 40s. Turned Cardizem gtt off. Called MD. EKG ordered. Results reviewed with hospitalist stated to hold all meds for now [facility] cardiology reviewed patient. Patient made aware of physician orders.

## 2015-10-28 NOTE — Progress Notes (Signed)
70 year old female with a history of intermittent A. fib came with chest pain was found to be in A. fib with RVR. Patient has significant history of CAD status post cardiac stent placement. Currently patient on IV Cardizem drip. She will be admitted to the stepdown unit.

## 2015-10-28 NOTE — H&P (Signed)
History and Physical    Virginia Huber [id number] DOB: 1946-03-09 DOA: 10/28/2015  Referring MD/NP/PA: Dr. Rolland Porter PCP: Purvis Kilts, MD  Outpatient Specialists: Gastroenterology: Lynelle Smoke; Cardiology: Dr. Bronson Ing  Patient coming from: Home  Chief Complaint: atrial fibrillation  HPI: Virginia Huber is a 70 y.o. female with medical history significant of HLD, CAD with native vessel, HTN, DM type 2, PAD NSTEMI, and atrial fibrillation anticoagulated with Eliquis and Plavix. Presented with complaints of chest pain and palpations that onset about 4am. Chest pain was located in upper central area and did not radiate. Pain described as feeling like her heart was pounding fast and woke her out of her sleep. Associated SOB and mild diaphoresis. Denies vomiting, diarrhea, noncompliance with medications, or new medications. She was in normal health before this occurred. Pain is similar to past episodes of atrial fibrillation and she reports 3 MIs in the past. Stent was placed Oct and Feb with reports of some blockage that are too small for stent placement.    ED Course: Patient was found to be in atrial fibrillation and started on Cardizem drip, which has been noted to convert her in the past. She was noted to have continued afib with RVR and admitted to SDU for further management.   Review of Systems: As per HPI otherwise 10 point review of systems negative.    Past Medical History  Diagnosis Date  . HTN (hypertension)   . Heart block   . Hypothyroidism   . Ovarian tumor   . Coronary artery disease 11/30/2014    a. remote MI. b. h/o PTCA with scoring balloon to OM1 11/2014. c. NSTEMI 03/2015 s/p DES to prox-mid Cx. d. NSTEMI 07/2015 s/p scoring balloon/PTCA/DES to dRCA with PAF during that admission  . UTI (urinary tract infection) 05/08/2013  . Superficial fungus infection of skin 06/29/2013  . TIA (transient ischemic attack) 08/2001; ~ 2006  . GERD (gastroesophageal reflux  disease)   . Arthritis   . Hypercholesteremia   . Myocardial infarction ([place]) 02/2012  . Type II diabetes mellitus ([place])   . Cutaneous lupus erythematosus   . History of blood transfusion 1980's    2nd surgical procedures  . Pain with urination 05/08/2015  . Atrial fibrillation and flutter ([place])     a. h/o PAF/flutter during admission in 2013 for PNA. b. PAF during adm for NSTEMI 07/2015.  [name] PAD (peripheral artery disease) ([place])     a. s/p LE angio 2015; followed by Dr. Fletcher Anon - managed medically.  . B12 deficiency anemia     Past Surgical History  Procedure Laterality Date  . 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 Partial hysterectomy  1970's    left ovaries, then ovaries removed later due tumors   . Left oophorectomy  05/1979    nicked bowel, peritonitis, colostomy; colostomy reversed 1981   . Nuclear med stress test  10/2011    Small area of mild ischemia inferoapically.  . Abdominal aortagram N/A 01/03/2014    Procedure: ABDOMINAL [name];  Surgeon: Wellington Hampshire, MD;  Location: [facility] CATH LAB;  Service: Cardiovascular;  Laterality: N/A;  . Lower extremity angiogram N/A 01/03/2014    Procedure: LOWER EXTREMITY ANGIOGRAM;  Surgeon: Wellington Hampshire, MD;  Location: [facility] CATH LAB;  Service: Cardiovascular;  Laterality: N/A;  . Colostomy reversal  11/1979  . Colonoscopy  2005    Dr. Laural Golden: pancolonic divericula, polyp, path unknown currently  . Appendectomy  1970's  . Cholecystectomy open  1990's  . Excisional hemorrhoidectomy  1970's  . Abdominal hysterectomy  1972    "partial"  . 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 Eye surgery Left ~ 2001    "smoothed out wrinkle"  . Cardiac catheterization  2008    Tiny OM-2 with 90% narrowing. Med tx.  . Cardiac catheterization N/A 11/30/2014    Procedure: Left Heart Cath and Coronary Angiography;  Surgeon: Troy Sine, MD; LAD 20%, CFX 50%, OM1 95%, right PLB 30%, LV normal   . Cardiac catheterization N/A 11/30/2014    Procedure: Coronary  Balloon Angioplasty;  Surgeon: Troy Sine, MD;  Angiosculpt scoring balloon and PTCA to the OM1 reducing stenosis from 95% to less than 10%  . Right oophorectomy  1970's  . Cardiac catheterization N/A 04/03/2015    Procedure: Left Heart Cath and Coronary Angiography;  Surgeon: Jolaine Artist, MD; dLAD 50%, CFX 90%, OM1 100%, PLA 15%, LVEDP 13    . Cardiac catheterization N/A 04/03/2015    Procedure: Coronary Stent Intervention;  Surgeon: Sherren Mocha, MD; 3.0x18 mm Xience DES to the CFX    . Cardiac catheterization N/A 08/02/2015    Procedure: Left Heart Cath and Coronary Angiography;  Surgeon: Troy Sine, MD;  Location: [facility] CV LAB;  Service: Cardiovascular;  Laterality: N/A;  . Cardiac catheterization N/A 08/02/2015    Procedure: Coronary Stent Intervention;  Surgeon: Troy Sine, MD;  Location: [facility] CV LAB;  Service: Cardiovascular;  Laterality: N/A;  . Cardiac stents       reports that she has never smoked. She has never used smokeless tobacco. She reports that she does not drink alcohol or use illicit drugs.    Family History  Problem Relation Age of Onset  . Heart disease Mother     deceased  . Heart disease Father     deceased, heart disease  . Colon cancer Neg Hx   . Diabetes Brother   . Heart disease Brother   . Thyroid disease Brother   . Heart disease Sister   . Heart disease Brother   . Thyroid disease Brother   . Lupus Daughter      Prior to Admission medications   Medication Sig Start Date End Date Taking? Authorizing Provider  amLODipine (NORVASC) 10 MG tablet TAKE ONE (1) TABLET EACH DAY 07/26/15  Yes Herminio Commons, MD  apixaban (ELIQUIS) 5 MG TABS tablet Take 1 tablet (5 mg total) by mouth 2 (two) times daily. 08/09/15  Yes Dayna N Dunn, PA-C  atorvastatin (LIPITOR) 80 MG tablet Take 1 tablet (80 mg total) by mouth daily at 6 PM. 08/03/15  Yes Bhavinkumar Bhagat, PA  cholecalciferol (VITAMIN D) 1000 UNITS tablet Take 1,000 Units  by mouth at bedtime.    Yes Historical Provider, MD  clopidogrel (PLAVIX) 75 MG tablet Take 1 tablet (75 mg total) by mouth daily. 08/09/15  Yes Dayna N Dunn, PA-C  furosemide (LASIX) 20 MG tablet Take 2 tablets (40 mg total) by mouth daily. 10/08/15  Yes Herminio Commons, MD  insulin NPH-regular Human (NOVOLIN 70/30) (70-30) 100 UNIT/ML injection Inject 45 Units into the skin 2 (two) times daily.   Yes Historical Provider, MD  labetalol (NORMODYNE) 300 MG tablet Take 1 tablet (300 mg total) by mouth 2 (two) times daily. 05/02/15  Yes Herminio Commons, MD  levothyroxine (SYNTHROID, LEVOTHROID) 150 MCG tablet Take 150 mcg by mouth every morning.   Yes Historical Provider, MD  losartan (COZAAR) 100 MG tablet TAKE ONE (1) TABLET BY MOUTH EVERY DAY 06/10/15  Yes Herminio Commons, MD  metFORMIN (GLUCOPHAGE) 500 MG tablet Take 500 mg by mouth 3 (three) times daily with meals.    Yes Historical Provider, MD  nitroGLYCERIN (NITROSTAT) 0.4 MG SL tablet Place 1 tablet (0.4 mg total) under the tongue every 5 (five) minutes as needed for chest pain. 04/04/15  Yes Rhonda G Barrett, PA-C  Omega-3 Fatty Acids (FISH OIL) 1200 MG CAPS Take 1 capsule (1,200 mg total) by mouth daily. 02/13/15  Yes Herminio Commons, MD  potassium chloride SA (KLOR-CON M20) 20 MEQ tablet Take 1 tablet (20 mEq total) by mouth daily. 10/02/15  Yes Isaiah Serge, NP  rOPINIRole (REQUIP) 0.5 MG tablet Take 0.5 mg by mouth at bedtime.  03/05/14  Yes Historical Provider, MD    Physical Exam: Filed Vitals:   10/28/15 0600 10/28/15 0630 10/28/15 0700 10/28/15 0820  BP: 121/74 109/68 120/72   Pulse: 118 119 118   Temp:      TempSrc:      Resp: 19 16 23    Height:    5\' 3"  (1.6 m)  Weight:    69.6 kg (153 lb 7 oz)  SpO2: 98% 97% 94%       Constitutional: NAD, calm, comfortable Filed Vitals:   10/28/15 0600 10/28/15 0630 10/28/15 0700 10/28/15 0820  BP: 121/74 109/68 120/72   Pulse: 118 119 118   Temp:      TempSrc:       Resp: 19 16 23    Height:    5\' 3"  (1.6 m)  Weight:    69.6 kg (153 lb 7 oz)  SpO2: 98% 97% 94%    Eyes: PERRL, lids and conjunctivae normal ENMT: Mucous membranes are moist. Posterior pharynx clear of any exudate or lesions.Normal dentition.  Neck: normal, supple, no masses, no thyromegaly Respiratory: clear to auscultation bilaterally, no wheezing, no crackles. Normal respiratory effort. No accessory muscle use.  Cardiovascular: Iregular rate, no murmurs / rubs / gallops. . 2+ pedal pulses. No carotid bruits. Trace edema bilaterally Abdomen: no tenderness, no masses palpated. No hepatosplenomegaly. Bowel sounds positive.  Musculoskeletal: no clubbing / cyanosis. No joint deformity upper and lower extremities. Good ROM, no contractures. Normal muscle tone.  Skin: no rashes, lesions, ulcers. No induration Neurologic: CN 2-12 grossly intact. Sensation intact, DTR normal. Strength 5/5 in all 4.  Psychiatric: Normal judgment and insight. Alert and oriented x 3. Normal mood.   Labs on Admission: I have personally reviewed following labs and imaging studies  CBC:  Recent Labs Lab 10/28/15 0448  WBC 7.9  HGB 11.1*  HCT 33.9*  MCV 92.4  PLT 123456   Basic Metabolic Panel:  Recent Labs Lab 10/28/15 0448  NA 141  K 3.8  CL 108  CO2 23  GLUCOSE 236*  BUN 16  CREATININE 0.85  CALCIUM 8.9   Coagulation Profile:  Recent Labs Lab 10/28/15 0448  INR 1.20   Cardiac Enzymes:  Recent Labs Lab 10/28/15 0448  TROPONINI <0.03   Urine analysis:    Component Value Date/Time   COLORURINE STRAW* 09/17/2015 0001   APPEARANCEUR HAZY* 09/17/2015 0001   APPEARANCEUR Cloudy* 07/03/2015 1530   LABSPEC 1.010 09/17/2015 0001   PHURINE 5.0 09/17/2015 0001   GLUCOSEU >1000* 09/17/2015 0001   HGBUR NEGATIVE 09/17/2015 0001   BILIRUBINUR NEGATIVE 09/17/2015 0001   BILIRUBINUR Negative 07/03/2015 1530   KETONESUR NEGATIVE 09/17/2015 0001   PROTEINUR  NEGATIVE 09/17/2015 0001   PROTEINUR  2+* 07/03/2015 1530   PROTEINUR 3+ 07/03/2015 1358   UROBILINOGEN 0.2 02/15/2015 1335   NITRITE NEGATIVE 09/17/2015 0001   NITRITE Negative 07/03/2015 1530   NITRITE pos 07/03/2015 1358   LEUKOCYTESUR NEGATIVE 09/17/2015 0001   LEUKOCYTESUR 1+* 07/03/2015 1530     Radiological Exams on Admission: Dg Chest Portable 1 View  10/28/2015  CLINICAL DATA:  Chest pain and midsternal pressure. Dyspnea. Onset tonight. EXAM: PORTABLE CHEST 1 VIEW COMPARISON:  10/02/2015. FINDINGS: A single AP portable view of the chest demonstrates no focal airspace consolidation or alveolar edema. The lungs are grossly clear. There is no large effusion or pneumothorax. Cardiac and mediastinal contours appear unremarkable. IMPRESSION: No active disease. Electronically Signed   By: Andreas Newport M.D.   On: 10/28/2015 05:15    EKG: Independently reviewed. Rapid afib.  Assessment/Plan Active Problems:   Atrial fibrillation with rapid ventricular response ([place])  1. Atrial fibrillation with RVR, on admission started on Cardizem drip. Patient states she typically converts into SR in past hospitalization. Cardiology consult requested. Will continue Cardizem drip and start on home dose of Labetalol. Check TSH. She is anticoagulated with Eliquis.  2. HLD. Continue statin.  3. DM Type 2. Started on SSI. Will continue 70/30 insulin. Follow up Hgb A1C.  4. CAD. Will continue plavix and BB. Cycle cardiac markers.  5. Essential HTN.Currently on Cardizem infusion. Will restart Labetalol. Will hold losartan to allow more space for rate controlled medications to be adjusted.   DVT prophylaxis: Eliquis  Code Status: Full Family Communication: Husband bedside.  Disposition Plan: Anticipate discharge in 2-3 days.  Consults called: Cardiology  Admission status: Inpatient, admitted to SDU.    Kathie Dike, MD [facility] Pager [phone] If 7PM-7AM, please contact night-coverage [email or web address] Password  [email or web address]  10/28/2015, 9:07 AM    By signing my name below, I, Rennis Harding, attest that this documentation has been prepared under the direction and in the presence of Kathie Dike, MD. Electronically signed: Rennis Harding, Scribe. 10/28/2015 11:50am  I, Dr. Kathie Dike, personally performed the services described in this documentaiton. All medical record entries made by the scribe were at my direction and in my presence. I have reviewed the chart and agree that the record reflects my personal performance and is accurate and complete  Kathie Dike, MD, 10/28/2015 12:12 PM

## 2015-10-28 NOTE — ED Notes (Signed)
Patient in gown, placed on cardiac monitoring and placed on gown. EKG done and given to EDP.

## 2015-10-28 NOTE — ED Notes (Signed)
Report given to Jeneen Rinks RN in ICU, staff to call when pt may be transferred

## 2015-10-29 DIAGNOSIS — I4891 Unspecified atrial fibrillation: Principal | ICD-10-CM

## 2015-10-29 LAB — BASIC METABOLIC PANEL
Anion gap: 6 (ref 5–15)
BUN: 16 mg/dL (ref 6–20)
CALCIUM: 8.6 mg/dL — AB (ref 8.9–10.3)
CHLORIDE: 112 mmol/L — AB (ref 101–111)
CO2: 24 mmol/L (ref 22–32)
CREATININE: 0.85 mg/dL (ref 0.44–1.00)
GFR calc non Af Amer: >60 mL/min (ref >60)
GLUCOSE: 140 mg/dL — AB (ref 65–99)
Potassium: 4.1 mmol/L (ref 3.5–5.1)
Sodium: 142 mmol/L (ref 135–145)

## 2015-10-29 LAB — CBC
HCT: 34 % — ABNORMAL LOW (ref 36.0–46.0)
Hemoglobin: 11 g/dL — ABNORMAL LOW (ref 12.0–15.0)
MCH: 29.7 pg (ref 26.0–34.0)
MCHC: 32.4 g/dL (ref 30.0–36.0)
MCV: 91.9 fL (ref 78.0–100.0)
PLATELETS: 208 10*3/uL (ref 150–400)
RBC: 3.7 MIL/uL — ABNORMAL LOW (ref 3.87–5.11)
RDW: 14.6 % (ref 11.5–15.5)
WBC: 6.8 10*3/uL (ref 4.0–10.5)

## 2015-10-29 LAB — GLUCOSE, CAPILLARY: GLUCOSE-CAPILLARY: 155 mg/dL — AB (ref 65–99)

## 2015-10-29 LAB — TROPONIN I

## 2015-10-29 LAB — T4, FREE: FREE T4: 1.36 ng/dL — AB (ref 0.61–1.12)

## 2015-10-29 MED ORDER — DILTIAZEM HCL 30 MG PO TABS: 30.0000 mg | ORAL_TABLET | Freq: Two times a day (BID) | ORAL | Status: DC | PRN

## 2015-10-29 MED ORDER — LABETALOL HCL 200 MG PO TABS: 400.0000 mg | ORAL_TABLET | Freq: Two times a day (BID) | ORAL | Status: DC

## 2015-10-29 NOTE — Progress Notes (Signed)
Discharge instructions and prescriptions given, verbalized understanding.

## 2015-10-29 NOTE — Consult Note (Signed)
Primary cardiologist: Dr Kate Sable Consulting cardiologist: Dr Carlyle Dolly Requesting phsyician: Dr Jay Schlichter Indication for consult: Afib RVR, chest pain  Clinical Summary Virginia Huber is a 70 y.o.female history of chronic diastolic heart failure, CAD with prior interventions including DES to LCX 03/2015 and DES to RCA in 07/2015, PAF, history of TIA, HL, DM2 admitted with palpitations and chest pain. She was started on dilt gtt and converted to NSR with resolution of symptoms.   Hgb 11.1, Plt 213, K 3.8, Cr 0.85, TSH 0.200, trop neg x 3 CXR no acute process EKG afib RVR, chronic lateral ST/T changes 05/2015 echo: LVEF 55-60%, no WMAs, grade II diastolic dysfunction    Allergies 1. PCN  2. Percocet  Medications Scheduled Medications: . apixaban  5 mg Oral BID  . atorvastatin  80 mg Oral q1800  . clopidogrel  75 mg Oral Daily  . furosemide  40 mg Oral Daily  . insulin aspart  0-5 Units Subcutaneous QHS  . insulin aspart  0-9 Units Subcutaneous TID WC  . insulin aspart protamine- aspart  45 Units Subcutaneous BID WC  . labetalol  400 mg Oral BID  . levothyroxine  150 mcg Oral q morning - 10a  . potassium chloride SA  20 mEq Oral Daily  . rOPINIRole  0.5 mg Oral QHS     Infusions: . diltiazem (CARDIZEM) infusion Stopped (10/28/15 1235)     PRN Medications:  acetaminophen, ondansetron (ZOFRAN) IV   Past Medical History  Diagnosis Date  . HTN (hypertension)   . Heart block   . Hypothyroidism   . Ovarian tumor   . Coronary artery disease 11/30/2014    a. remote MI. b. h/o PTCA with scoring balloon to OM1 11/2014. c. NSTEMI 03/2015 s/p DES to prox-mid Cx. d. NSTEMI 07/2015 s/p scoring balloon/PTCA/DES to dRCA with PAF during that admission  . UTI (urinary tract infection) 05/08/2013  . Superficial fungus infection of skin 06/29/2013  . TIA (transient ischemic attack) 08/2001; ~ 2006  . GERD (gastroesophageal reflux disease)   . Arthritis   .  Hypercholesteremia   . Myocardial infarction ([place]) 02/2012  . Type II diabetes mellitus ([place])   . Cutaneous lupus erythematosus   . History of blood transfusion 1980's    2nd surgical procedures  . Pain with urination 05/08/2015  . Atrial fibrillation and flutter ([place])     a. h/o PAF/flutter during admission in 2013 for PNA. b. PAF during adm for NSTEMI 07/2015.  [name] PAD (peripheral artery disease) ([place])     a. s/p LE angio 2015; followed by Dr. Fletcher Anon - managed medically.  . B12 deficiency anemia     Past Surgical History  Procedure Laterality Date  . 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 Partial hysterectomy  1970's    left ovaries, then ovaries removed later due tumors   . Left oophorectomy  05/1979    nicked bowel, peritonitis, colostomy; colostomy reversed 1981   . Nuclear med stress test  10/2011    Small area of mild ischemia inferoapically.  . Abdominal aortagram N/A 01/03/2014    Procedure: ABDOMINAL [name];  Surgeon: Wellington Hampshire, MD;  Location: [facility] CATH LAB;  Service: Cardiovascular;  Laterality: N/A;  . Lower extremity angiogram N/A 01/03/2014    Procedure: LOWER EXTREMITY ANGIOGRAM;  Surgeon: Wellington Hampshire, MD;  Location: [place] CATH LAB;  Service: Cardiovascular;  Laterality: N/A;  . Colostomy reversal  11/1979  . Colonoscopy  2005    Dr. Laural Golden: pancolonic  divericula, polyp, path unknown currently  . Appendectomy  1970's  . Cholecystectomy open  1990's  . Excisional hemorrhoidectomy  1970's  . Abdominal hysterectomy  1972    "partial"  . Eye surgery Left 2000    "[place] vein occlusion"  . Eye surgery Left ~ 2001    "smoothed out wrinkle"  . Cardiac catheterization  2008    Tiny OM-2 with 90% narrowing. Med tx.  . Cardiac catheterization N/A 11/30/2014    Procedure: Left Heart Cath and Coronary Angiography;  Surgeon: Troy Sine, MD; LAD 20%, CFX 50%, OM1 95%, right PLB 30%, LV normal   . Cardiac catheterization N/A 11/30/2014    Procedure: Coronary Balloon Angioplasty;   Surgeon: Troy Sine, MD;  Angiosculpt scoring balloon and PTCA to the OM1 reducing stenosis from 95% to less than 10%  . Right oophorectomy  1970's  . Cardiac catheterization N/A 04/03/2015    Procedure: Left Heart Cath and Coronary Angiography;  Surgeon: Jolaine Artist, MD; dLAD 50%, CFX 90%, OM1 100%, PLA 15%, LVEDP 13    . Cardiac catheterization N/A 04/03/2015    Procedure: Coronary Stent Intervention;  Surgeon: Sherren Mocha, MD; 3.0x18 mm Xience DES to the CFX    . Cardiac catheterization N/A 08/02/2015    Procedure: Left Heart Cath and Coronary Angiography;  Surgeon: Troy Sine, MD;  Location: [place] CV LAB;  Service: Cardiovascular;  Laterality: N/A;  . Cardiac catheterization N/A 08/02/2015    Procedure: Coronary Stent Intervention;  Surgeon: Troy Sine, MD;  Location: [facility] CV LAB;  Service: Cardiovascular;  Laterality: N/A;  . Cardiac stents      Family History  Problem Relation Age of Onset  . Heart disease Mother     deceased  . Heart disease Father     deceased, heart disease  . Colon cancer Neg Hx   . Diabetes Brother   . Heart disease Brother   . Thyroid disease Brother   . Heart disease Sister   . Heart disease Brother   . Thyroid disease Brother   . Lupus Daughter     Social History Virginia Huber reports that she has never smoked. She has never used smokeless tobacco. Virginia Huber reports that she does not drink alcohol.  Review of Systems CONSTITUTIONAL: No weight loss, fever, chills, weakness or fatigue.  HEENT: Eyes: No visual loss, blurred vision, double vision or yellow sclerae. No hearing loss, sneezing, congestion, runny nose or sore throat.  SKIN: No rash or itching.  CARDIOVASCULAR: No chest pain, chest pressure or chest discomfort. No palpitations or edema.  RESPIRATORY: No shortness of breath, cough or sputum.  GASTROINTESTINAL: No anorexia, nausea, vomiting or diarrhea. No abdominal pain or blood.  GENITOURINARY: no  polyuria, no dysuria NEUROLOGICAL: No headache, dizziness, syncope, paralysis, ataxia, numbness or tingling in the extremities. No change in bowel or bladder control.  MUSCULOSKELETAL: No muscle, back pain, joint pain or stiffness.  HEMATOLOGIC: No anemia, bleeding or bruising.  LYMPHATICS: No enlarged nodes. No history of splenectomy.  PSYCHIATRIC: No history of depression or anxiety.      Physical Examination Blood pressure 132/61, pulse 64, temperature 97.3 F (36.3 C), temperature source Oral, resp. rate 17, height 5\' 3"  (1.6 m), weight 153 lb 3.5 oz (69.5 kg), SpO2 99 %.  Intake/Output Summary (Last 24 hours) at 10/29/15 1015 Last data filed at 10/29/15 0829  Gross per 24 hour  Intake   1200 ml  Output  0 ml  Net   1200 ml    HEENT: sclera clear  Cardiovascular: RRR, no m/r/g, no jvd  Respiratory: CTAB  GI: abdomen soft, NT, ND  MSK: no LE edema  Neuro: no focal deficits  Psych: appropriate affect   Lab Results  Basic Metabolic Panel:  Recent Labs Lab 10/28/15 0448 10/29/15 0508  NA 141 142  K 3.8 4.1  CL 108 112*  CO2 23 24  GLUCOSE 236* 140*  BUN 16 16  CREATININE 0.85 0.85  CALCIUM 8.9 8.6*    Liver Function Tests: No results for input(s): AST, ALT, ALKPHOS, BILITOT, PROT, ALBUMIN in the last 168 hours.  CBC:  Recent Labs Lab 10/28/15 0448 10/29/15 0508  WBC 7.9 6.8  HGB 11.1* 11.0*  HCT 33.9* 34.0*  MCV 92.4 91.9  PLT 213 208    Cardiac Enzymes:  Recent Labs Lab 10/28/15 0448 10/28/15 1220 10/28/15 1825 10/29/15 0004  TROPONINI <0.03 <0.03 <0.03 <0.03    BNP: Invalid input(s): POCBNP     Impression/Recommendations 1. Afib with RVR - history of PAF, admitted with symptomatic afib with RVR - started on dilt gtt and converted to NSR shortly after, labetolol was increased to 400mg  bid. She did have a postconversion pause followed by junctional bradycardia after coming out of afib but shortly her sinus rhythm  reestabslished.  - she is on anticoag with eliquis 5mg  bid. CHADS2Vasc of 15 (age, gender, CAD, TIA,DM2) - continue higher dose of labetolol, please add short acting dilt 30mg  bid prn palpitations. She is ok for discharge.   2. CAD - troponins negative - remains on plavix due to recent stent 07/2015, no ASA since also on eliquis to avoid triple therapy and bleeing risk - troponins negative, EKG with chronic lateral ST/T changes  3. Hyperthyroidism - low TSH, free T4 and T3 added. WIll need to be followed up at her outpatient appointment   We will sign off. [place] for discharge today. I have contacted our office about arranging f/u in 2 weeks.   Carlyle Dolly, M.D.

## 2015-10-29 NOTE — Discharge Summary (Addendum)
Physician Discharge Summary  Virginia Huber [id number] DOB: Dec 06, 1945 DOA: 10/28/2015  PCP: Purvis Kilts, MD  Admit date: 10/28/2015 Discharge date: 10/29/2015  Time spent: 35 minutes  Recommendations for Outpatient Follow-up:  1. Follow up with PCP in 1-2 weeks 2. Follow up with Cardiology in 1-2 weeks 3. Follow up T3 and T4 studies. If abnormal, she will need to be referred back to her endocrinologist. 4. She will be given Cardizem to take PRN palpitations    Discharge Diagnoses:  Principal Problem:   Atrial fibrillation with rapid ventricular response (HCC) Active Problems:   Hyperlipidemia   Essential hypertension   Hypothyroid   DM (diabetes mellitus), type 2, uncontrolled ([place])   Atrial fibrillation with RVR ([place]) Chronic diastolic CHF   Discharge Condition: improved  Diet recommendation: Carb modified, heart healthy  Filed Weights   10/28/15 0439 10/28/15 0820 10/29/15 0500  Weight: 68.04 kg (150 lb) 69.6 kg (153 lb 7 oz) 69.5 kg (153 lb 3.5 oz)    History of present illness:  70 y.o. female with medical history significant of HLD, CAD with native vessel, HTN, DM type 2, PAD NSTEMI, and atrial fibrillation anticoagulated with Eliquis and Plavix presented with complaints of chest pain and palpations. Chest pain was located in upper central area and did not radiate. Pain described as feeling like her heart was pounding fast and woke her out of her sleep. She reported associated SOB and mild diaphoresis, but denied vomiting, diarrhea, noncompliance with medications, or new medications. She was in normal health before this occurred. Pain is similar to past episodes of atrial fibrillation and she reports 3 MIs in the past. Stent was placed Oct and Feb with reports of some blockage that are too small for stent placement.   Hospital Course:  Patient was found to be in atrial fibrillation with RVR, and was started on a Cardizem drip on admission. She converted back into  normal sinus rhythm. She was seen by cardiology who recommended increasing her labetalol and using Cardizem PRN palpitations. She has remained in SR since yesterday, and is felt stable to discharge home. TSH was noted to be mildly low. T3 and T4 are in process and can be followed up as an outpatient. If abnormal, she will need to be referred back to her endocrinologist.  She will be discharged with cardizem to take PRN and will need to follow up with Dr. Harl Bowie as an outpatient. Continue anticoagulation with eliquis.  1. HLD. Continue statin.  2. DM Type 2. Continue SSI. Will continue 70/30 insulin. Follow up Hgb A1C.  3. CAD. Will continue plavix and BB. Cycle cardiac markers.  4. Essential HTN. Continue outpatient regimen. 5. Hypothyroidism. She is followed by an endocrinologist in [place]. Continue synthroid.  6. Chronic diastolic CHF, appears compensated.  Procedures:  none  Consultations:  Cardiology  Discharge Exam: Filed Vitals:   10/29/15 0900 10/29/15 1000  BP: 138/59 118/51  Pulse: 65 61  Temp:    Resp: 16 22    Examination:  General exam: Appears calm and comfortable  Respiratory system: Clear to auscultation. Respiratory effort normal. Cardiovascular system: S1 & S2 heard, RRR. No JVD, murmurs, rubs, gallops or clicks. No pedal edema. Gastrointestinal system: Abdomen is nondistended, soft and nontender. No organomegaly or masses felt. Normal bowel sounds heard. Central nervous system: Alert and oriented. No focal neurological deficits. Extremities: Symmetric 5 x 5 power. Skin: No rashes, lesions or ulcers Psychiatry: Judgement and insight appear normal. Mood & affect appropriate.  Discharge Instructions   Discharge Instructions    Diet - low sodium heart healthy    Complete by:  As directed      Increase activity slowly    Complete by:  As directed           Discharge Medication List as of 10/29/2015 11:28 AM    START taking these medications    Details  diltiazem (CARDIZEM) 30 MG tablet Take 1 tablet (30 mg total) by mouth 2 (two) times daily as needed (palpitations)., Starting 10/29/2015, Until Discontinued, Print      CONTINUE these medications which have CHANGED   Details  labetalol (NORMODYNE) 200 MG tablet Take 2 tablets (400 mg total) by mouth 2 (two) times daily., Starting 10/29/2015, Until Discontinued, Print      CONTINUE these medications which have NOT CHANGED   Details  amLODipine (NORVASC) 10 MG tablet TAKE ONE (1) TABLET EACH DAY, Normal    apixaban (ELIQUIS) 5 MG TABS tablet Take 1 tablet (5 mg total) by mouth 2 (two) times daily., Starting 08/09/2015, Until Discontinued, Normal    atorvastatin (LIPITOR) 80 MG tablet Take 1 tablet (80 mg total) by mouth daily at 6 PM., Starting 08/03/2015, Until Discontinued, Normal    cholecalciferol (VITAMIN D) 1000 UNITS tablet Take 1,000 Units by mouth at bedtime. , Until Discontinued, Historical Med    clopidogrel (PLAVIX) 75 MG tablet Take 1 tablet (75 mg total) by mouth daily., Starting 08/09/2015, Until Discontinued, Normal    furosemide (LASIX) 20 MG tablet Take 2 tablets (40 mg total) by mouth daily., Starting 10/08/2015, Until Discontinued, No Print    insulin NPH-regular Human (NOVOLIN 70/30) (70-30) 100 UNIT/ML injection Inject 50 Units into the skin 2 (two) times daily with a meal. , Until Discontinued, Historical Med    levothyroxine (SYNTHROID, LEVOTHROID) 150 MCG tablet Take 150 mcg by mouth every morning., Until Discontinued, Historical Med    losartan (COZAAR) 100 MG tablet TAKE ONE (1) TABLET BY MOUTH EVERY DAY, Normal    magnesium oxide (MAG-OX) 400 MG tablet Take 400 mg by mouth daily., Until Discontinued, Historical Med    metFORMIN (GLUCOPHAGE) 500 MG tablet Take 500 mg by mouth 3 (three) times daily with meals. , Until Discontinued, Historical Med    nitroGLYCERIN (NITROSTAT) 0.4 MG SL tablet Place 1 tablet (0.4 mg total) under the tongue every 5 (five)  minutes as needed for chest pain., Starting 04/04/2015, Until Discontinued, Normal    Omega-3 Fatty Acids (FISH OIL) 1200 MG CAPS Take 1 capsule (1,200 mg total) by mouth daily., Starting 02/13/2015, Until Discontinued, OTC    potassium chloride SA (KLOR-CON M20) 20 MEQ tablet Take 1 tablet (20 mEq total) by mouth daily., Starting 10/02/2015, Until Discontinued, Normal    rOPINIRole (REQUIP) 0.5 MG tablet Take 0.5 mg by mouth at bedtime. , Starting 03/05/2014, Until Discontinued, Historical Med      STOP taking these medications     naproxen sodium (ALEVE) 220 MG tablet         Follow-up Information    Follow up with Purvis Kilts, MD On 11/04/2015.   Specialty:  Family Medicine   Why:  At [facility] information:   [address] [place] [id number] [phone]        The results of significant diagnostics from this hospitalization (including imaging, microbiology, ancillary and laboratory) are listed below for reference.    Significant Diagnostic Studies: Dg Chest 2 View  10/02/2015  CLINICAL DATA:  Shortness  of breath. Inspirational chest pain with fluid retention. History of atrial fibrillation. EXAM: CHEST  2 VIEW COMPARISON:  09/16/2015 and 08/02/2015. FINDINGS: The heart size and mediastinal contours are stable. There are new small bilateral pleural effusions with associated bibasilar atelectasis. There is underlying vascular congestion and possible mild edema. No confluent airspace opacity or pneumothorax. Postsurgical changes are present within the upper abdomen. No acute osseous findings are seen. IMPRESSION: Vascular congestion, possible mild edema and new small bilateral pleural effusions suspicious for congestive heart failure. Electronically Signed   By: Richardean Sale M.D.   On: 10/02/2015 15:42   Dg Chest Portable 1 View  10/28/2015  CLINICAL DATA:  Chest pain and midsternal pressure. Dyspnea. Onset tonight. EXAM: PORTABLE CHEST 1 VIEW COMPARISON:   10/02/2015. FINDINGS: A single AP portable view of the chest demonstrates no focal airspace consolidation or alveolar edema. The lungs are grossly clear. There is no large effusion or pneumothorax. Cardiac and mediastinal contours appear unremarkable. IMPRESSION: No active disease. Electronically Signed   By: Andreas Newport M.D.   On: 10/28/2015 05:15    Microbiology: Recent Results (from the past 240 hour(s))  MRSA PCR Screening     Status: None   Collection Time: 10/28/15  1:26 PM  Result Value Ref Range Status   MRSA by PCR NEGATIVE NEGATIVE Final    Comment:        The GeneXpert MRSA Assay (FDA approved for NASAL specimens only), is one component of a comprehensive MRSA colonization surveillance program. It is not intended to diagnose MRSA infection nor to guide or monitor treatment for MRSA infections.      Labs: Basic Metabolic Panel:  Recent Labs Lab 10/28/15 0448 10/29/15 0508  NA 141 142  K 3.8 4.1  CL 108 112*  CO2 23 24  GLUCOSE 236* 140*  BUN 16 16  CREATININE 0.85 0.85  CALCIUM 8.9 8.6*   Liver Function Tests: No results for input(s): AST, ALT, ALKPHOS, BILITOT, PROT, ALBUMIN in the last 168 hours. No results for input(s): LIPASE, AMYLASE in the last 168 hours. No results for input(s): AMMONIA in the last 168 hours. CBC:  Recent Labs Lab 10/28/15 0448 10/29/15 0508  WBC 7.9 6.8  HGB 11.1* 11.0*  HCT 33.9* 34.0*  MCV 92.4 91.9  PLT 213 208   Cardiac Enzymes:  Recent Labs Lab 10/28/15 0448 10/28/15 1220 10/28/15 1825 10/29/15 0004  TROPONINI <0.03 <0.03 <0.03 <0.03   BNP: BNP (last 3 results)  Recent Labs  09/16/15 2228 10/02/15 1550  BNP 123.0* 459.0*    ProBNP (last 3 results) No results for input(s): PROBNP in the last 8760 hours.  CBG:  Recent Labs Lab 10/28/15 1558 10/28/15 2052 10/29/15 0749  GLUCAP 286* 154* 155*    Signed:  Kathie Dike, MD.  [facility] 10/29/2015, 12:48 PM  By signing my name  below, I, Delene Ruffini, attest that this documentation has been prepared under the direction and in the presence of Kathie Dike, MD. Electronically Signed: Delene Ruffini 10/29/2015 1045am  I, Dr. Kathie Dike, personally performed the services described in this documentaiton. All medical record entries made by the scribe were at my direction and in my presence. I have reviewed the chart and agree that the record reflects my personal performance and is accurate and complete  Kathie Dike, MD, 10/29/2015 12:48 PM

## 2015-10-30 ENCOUNTER — Telehealth: Payer: Self-pay | Admitting: *Deleted

## 2015-10-30 LAB — T3: T3 TOTAL: 100 ng/dL (ref 71–180)

## 2015-10-30 LAB — HEMOGLOBIN A1C
HEMOGLOBIN A1C: 7.5 % — AB (ref 4.8–5.6)
Mean Plasma Glucose: 169 mg/dL

## 2015-10-30 NOTE — Telephone Encounter (Signed)
-----   Message from Arnoldo Lenis, MD sent at 10/30/2015 12:49 PM EDT ----- Labs show thyroid level is high, please forward to pcp, they may consider adjusting her meds  J BrancH MD

## 2015-10-30 NOTE — Telephone Encounter (Signed)
Called patient with test results. No answer. Left message to call back.  

## 2015-10-31 ENCOUNTER — Encounter: Payer: Self-pay | Admitting: Gastroenterology

## 2015-11-04 DIAGNOSIS — I4891 Unspecified atrial fibrillation: Secondary | ICD-10-CM | POA: Diagnosis not present

## 2015-11-04 DIAGNOSIS — E039 Hypothyroidism, unspecified: Secondary | ICD-10-CM | POA: Diagnosis not present

## 2015-11-04 DIAGNOSIS — Z6827 Body mass index (BMI) 27.0-27.9, adult: Secondary | ICD-10-CM | POA: Diagnosis not present

## 2015-11-04 DIAGNOSIS — Z1389 Encounter for screening for other disorder: Secondary | ICD-10-CM | POA: Diagnosis not present

## 2015-11-11 ENCOUNTER — Ambulatory Visit (INDEPENDENT_AMBULATORY_CARE_PROVIDER_SITE_OTHER): Payer: PPO | Admitting: Adult Health

## 2015-11-11 ENCOUNTER — Encounter: Payer: Self-pay | Admitting: Adult Health

## 2015-11-11 VITALS — BP 106/52 | HR 67 | Ht 63.0 in | Wt 150.0 lb

## 2015-11-11 DIAGNOSIS — I10 Essential (primary) hypertension: Secondary | ICD-10-CM

## 2015-11-11 DIAGNOSIS — I2510 Atherosclerotic heart disease of native coronary artery without angina pectoris: Secondary | ICD-10-CM | POA: Diagnosis not present

## 2015-11-11 DIAGNOSIS — I480 Paroxysmal atrial fibrillation: Secondary | ICD-10-CM | POA: Diagnosis not present

## 2015-11-11 NOTE — Progress Notes (Signed)
Cardiology Office Note   Date:  11/11/2015   ID:  Virginia Huber, Virginia Huber [birth year], MRN [id number]  PCP:  Purvis Kilts, MD  Cardiologist: Woodroe Chen, NP   No chief complaint on file.     History of Present Illness: Virginia Huber is a 70 y.o. female who presents for ongoing assessment and management of chronic diastolic heart failure, CAD, atrial fibrillation, hypertension, and hyperlipidemia. The patient was last seen by Dr. Bronson Ing on 10/04/2015.  On that office visit Lasix was increased to 40 mg twice a day for 4 days and then add metolazone 2.5 mg for 4 days, then reduce Lasix to 40 mg daily. She was continued on potassium supplement. BMET to follow. She was continued on ELIQUIS and labetalol for management and prophylaxis in atrial fibrillation.she followed up one week later on 10/08/2015 and was euvolemic with weight 144 pounds. She was to continue Lasix 40 mg daily with potassium supplement. She was to followup in 2 months.  Unfortunately, the patient was admitted to the hospital on 10/29/2015 in the setting of atrial fibrillation with RVR and chest pain.she was started on diltiazem drip and converted to normal sinus rhythm shortly after, labetalol was increased to 400 mg twice a day. She did have a postconversion pause followed by junctional bradycardia after coming out of atrial fibrillation and remained in normal sinus rhythm. She was to continue higher dose of labetalol, and short acting diltiazem 30 mg twice a day was added when necessary palpitations.  She is anxious but has not had to take any of the diltiazem when necessary. She is doing well and is without complaints of rapid heart rhythm dizziness or chest pain. Blood pressure is soft today that she is tolerating it. Her only concern is being able to afford the [facility] as she is "in the donot hole."  Past Medical History  Diagnosis Date  . HTN (hypertension)   . Heart block   . Hypothyroidism    . Ovarian tumor   . Coronary artery disease 11/30/2014    a. remote MI. b. h/o PTCA with scoring balloon to OM1 11/2014. c. NSTEMI 03/2015 s/p DES to prox-mid Cx. d. NSTEMI 07/2015 s/p scoring balloon/PTCA/DES to dRCA with PAF during that admission  . UTI (urinary tract infection) 05/08/2013  . Superficial fungus infection of skin 06/29/2013  . TIA (transient ischemic attack) 08/2001; ~ 2006  . GERD (gastroesophageal reflux disease)   . Arthritis   . Hypercholesteremia   . Myocardial infarction ([place]) 02/2012  . Type II diabetes mellitus ([place])   . Cutaneous lupus erythematosus   . History of blood transfusion 1980's    2nd surgical procedures  . Pain with urination 05/08/2015  . Atrial fibrillation and flutter ([place])     a. h/o PAF/flutter during admission in 2013 for PNA. b. PAF during adm for NSTEMI 07/2015.  [name] PAD (peripheral artery disease) ([place])     a. s/p LE angio 2015; followed by Dr. Fletcher Anon - managed medically.  . B12 deficiency anemia     Past Surgical History  Procedure Laterality Date  . 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 Partial hysterectomy  1970's    left ovaries, then ovaries removed later due tumors   . Left oophorectomy  05/1979    nicked bowel, peritonitis, colostomy; colostomy reversed 1981   . Nuclear med stress test  10/2011    Small area of mild ischemia inferoapically.  . Abdominal aortagram N/A 01/03/2014    Procedure: ABDOMINAL  [name];  Surgeon: Wellington Hampshire, MD;  [facility] C[facility] LAB;  Service: Cardiovascular;  Laterality: N/A;  . Lower extremity angiogram N/A 01/03/2014    Procedure: LOWER EXTREMITY ANGIOGRAM;  Surgeon: Wellington Hampshire, MD;  Location: [facility] CATH LAB;  Service: Cardiovascular;  Laterality: N/A;  . Colostomy reversal  11/1979  . Colonoscopy  2005    Dr. Laural Golden: pancolonic divericula, polyp, path unknown currently  . Appendectomy  1970's  . Cholecystectomy open  1990's  . Excisional hemorrhoidectomy  1970's  . Abdominal hysterectomy  1972    "partial"   . 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 Eye surgery Left ~ 2001    "smoothed out wrinkle"  . Cardiac catheterization  2008    Tiny OM-2 with 90% narrowing. Med tx.  . Cardiac catheterization N/A 11/30/2014    Procedure: Left Heart Cath and Coronary Angiography;  Surgeon: Troy Sine, MD; LAD 20%, CFX 50%, OM1 95%, right PLB 30%, LV normal   . Cardiac catheterization N/A 11/30/2014    Procedure: Coronary Balloon Angioplasty;  Surgeon: Troy Sine, MD;  Angiosculpt scoring balloon and PTCA to the OM1 reducing stenosis from 95% to less than 10%  . Right oophorectomy  1970's  . Cardiac catheterization N/A 04/03/2015    Procedure: Left Heart Cath and Coronary Angiography;  Surgeon: Jolaine Artist, MD; dLAD 50%, CFX 90%, OM1 100%, PLA 15%, LVEDP 13    . Cardiac catheterization N/A 04/03/2015    Procedure: Coronary Stent Intervention;  Surgeon: Sherren Mocha, MD; 3.0x18 mm Xience DES to the CFX    . Cardiac catheterization N/A 08/02/2015    Procedure: Left Heart Cath and Coronary Angiography;  Surgeon: Troy Sine, MD;  Location: [place] CV LAB;  Service: Cardiovascular;  Laterality: N/A;  . Cardiac catheterization N/A 08/02/2015    Procedure: Coronary Stent Intervention;  Surgeon: Troy Sine, MD;  Location: [place] CV LAB;  Service: Cardiovascular;  Laterality: N/A;  . Cardiac stents       Current Outpatient Prescriptions  Medication Sig Dispense Refill  . amLODipine (NORVASC) 10 MG tablet TAKE ONE (1) TABLET EACH DAY 90 tablet 3  . apixaban (ELIQUIS) 5 MG TABS tablet Take 1 tablet (5 mg total) by mouth 2 (two) times daily. 60 tablet 3  . atorvastatin (LIPITOR) 80 MG tablet Take 1 tablet (80 mg total) by mouth daily at 6 PM. 30 tablet 6  . cholecalciferol (VITAMIN D) 1000 UNITS tablet Take 1,000 Units by mouth at bedtime.     . clopidogrel (PLAVIX) 75 MG tablet Take 1 tablet (75 mg total) by mouth daily. 90 tablet 3  . diltiazem (CARDIZEM) 30 MG tablet  Take 1 tablet (30 mg total) by mouth 2 (two) times daily as needed (palpitations). 30 tablet 1  . furosemide (LASIX) 20 MG tablet Take 2 tablets (40 mg total) by mouth daily.    . insulin NPH-regular Human (NOVOLIN 70/30) (70-30) 100 UNIT/ML injection Inject 50 Units into the skin 2 (two) times daily with a meal.     . labetalol (NORMODYNE) 200 MG tablet Take 2 tablets (400 mg total) by mouth 2 (two) times daily. 120 tablet 1  . levothyroxine (SYNTHROID, LEVOTHROID) 150 MCG tablet Take 150 mcg by mouth every morning.    [name] losartan (COZAAR) 100 MG tablet TAKE ONE (1) TABLET BY MOUTH EVERY DAY 90 tablet 3  . magnesium oxide (MAG-OX) 400 MG tablet Take 400 mg by mouth daily.    [name]  metFORMIN (GLUCOPHAGE) 500 MG tablet Take 500 mg by mouth 3 (three) times daily with meals.     . nitroGLYCERIN (NITROSTAT) 0.4 MG SL tablet Place 1 tablet (0.4 mg total) under the tongue every 5 (five) minutes as needed for chest pain. 25 tablet 12  . Omega-3 Fatty Acids (FISH OIL) 1200 MG CAPS Take 1 capsule (1,200 mg total) by mouth daily.    . potassium chloride SA (KLOR-CON M20) 20 MEQ tablet Take 1 tablet (20 mEq total) by mouth daily. 90 tablet 3  . rOPINIRole (REQUIP) 0.5 MG tablet Take 0.5 mg by mouth at bedtime.   0   No current facility-administered medications for this visit.    Allergies:   Penicillins and Percocet    Social History:  The patient  reports that she has never smoked. She has never used smokeless tobacco. She reports that she does not drink alcohol or use illicit drugs.   Family History:  The patient's family history includes Diabetes in her brother; Heart disease in her brother, brother, father, mother, and sister; Lupus in her daughter; Thyroid disease in her brother and brother. There is no history of Colon cancer.    ROS: All other systems are reviewed and negative. Unless otherwise mentioned in H&P    PHYSICAL EXAM: VS:  Pulse 67  Ht 5\' 3"  (1.6 m)  Wt 150 lb (68.04 kg)  BMI 26.58  kg/m2  SpO2 98% , BMI Body mass index is 26.58 kg/(m^2). GEN: Well nourished, well developed, in no acute distress HEENT: normal Neck: no JVD, carotid bruits, or masses Cardiac: RRR, bradycardic, 1/6 systolic murmur,; no murmurs, rubs, or gallops,no edema  Respiratory:  Clear to auscultation bilaterally, normal work of breathing GI: soft, nontender, nondistended, + BS MS: no deformity or atrophy Skin: warm and dry, no rash Neuro:  Strength and sensation are intact Psych: euthymic mood, full affect   Recent Labs: 09/18/2015: Magnesium 1.6* 10/02/2015: ALT 9; B Natriuretic Peptide 459.0* 10/28/2015: TSH 0.200* 10/29/2015: BUN 16; Creatinine, Ser 0.85; Hemoglobin 11.0*; Platelets 208; Potassium 4.1; Sodium 142    Lipid Panel    Component Value Date/Time   CHOL 97* 10/02/2015 0850   TRIG 117 10/02/2015 0850   HDL 36* 10/02/2015 0850   CHOLHDL 2.7 10/02/2015 0850   VLDL 23 10/02/2015 0850   LDLCALC 38 10/02/2015 0850      Wt Readings from Last 3 Encounters:  11/11/15 150 lb (68.04 kg)  10/29/15 153 lb 3.5 oz (69.5 kg)  10/08/15 144 lb (65.318 kg)     ASSESSMENT AND PLAN:  1.  Paroxysmal atrial fibrillation: She is feeling better. She has not had to take any when necessary diltiazem since being seen last. She continues to tolerate ELIQUIS. There is no bleeding or bruising or hemoptysis. Her concern is being able to afford the anticoagulant. We're providing her with four weeks of samples, and giving her paperwork to fill out for medical assistance.she has an appointment with Dr. Bronson Ing on 12/16/2015 which she would like to keep it she states she has not seen him in a while.  2. Coronary artery disease:most recent cardiac catheterization in February 2017, completed in the setting of non-ST elevation MI. This revealed diffuse mild luminal narrowing of the LAD with a 40% proximal and mid stenosis, widely patent stent in the proximal circumflex with an old occlusion of the marginal  branch, which had arisen in the region of the stented segment but with evidence of very faint collateralization to the distal  marginal vessel from the LAD;; a large dominant RCA with 20%, mild mid narrowing, 30% narrowing after the acute margin and focal 90% stenosis distally prior to a PDA 2 vessel with 50% narrowing at the ostium of this PDA prior to a bend in the vessel with the RCA ending in the PLA vessel.  She had successful PCI and intervention to the distal right coronary artery, with PTCA, and drug-eluting stent placement. She was unable to tolerate Brilinta but is tolerating Plavix. She will continue atorvastatin, and Plavix. She is no longer on aspirin in the setting of ELIQUIS and Plavix.  3. Hypertension: blood pressure is soft currently. Labetalol was increased to 400 mg twice a day. She does not have any dizziness or position related dizziness. She appears to be tolerating all of her medications at this time. She will followup with Dr. Bronson Ing in approximately one month. If she is symptomatic due to hypotension she may need to have medication adjustments.  Current medicines are reviewed at length with the patient today.    Labs/ tests ordered today include:  No orders of the defined types were placed in this encounter.     Disposition:   FU with Dr. Bronson Ing a previously scheduled appointment.  Signed, Jory Sims, NP  11/11/2015 2:51 PM    [facility] [address], [place], [place] [address] Phone: [phone]; Fax: [phone]

## 2015-11-11 NOTE — Patient Instructions (Signed)
Medication Instructions:  Your physician recommends that you continue on your current medications as directed. Please refer to the Current Medication list given to you today.   Labwork: none  Testing/Procedures: none  Follow-Up: Your physician recommends that you schedule a follow-up appointment in: June 26 @ 2:20 with Dr. Bronson Ing    Any Other Special Instructions Will Be Listed Below (If Applicable).     If you need a refill on your cardiac medications before your next appointment, please call your pharmacy.

## 2015-11-11 NOTE — Progress Notes (Signed)
Name: Virginia Huber    DOB: Oct 24, 1945  Age: 70 y.o.  MR#: [id number]       PCP:  Purvis Kilts, MD      Insurance: Payor: Tenn[employer] Must / Plan: Tennis Must / Product Type: *No Product type* /   CC:   No chief complaint on file.   VS Filed Vitals:   11/11/15 1450  BP: 106/52  Pulse: 67  Height: 5\' 3"  (1.6 m)  Weight: 150 lb (68.04 kg)  SpO2: 98%    Weights Current Weight  11/11/15 150 lb (68.04 kg)  10/29/15 153 lb 3.5 oz (69.5 kg)  10/08/15 144 lb (65.318 kg)    Blood Pressure  BP Readings from Last 3 Encounters:  11/11/15 106/52  10/29/15 118/51  10/08/15 102/52     Admit date:  (Not on file) Last encounter with RMR:  Visit date not found   Allergy Penicillins and Percocet  Current Outpatient Prescriptions  Medication Sig Dispense Refill  . amLODipine (NORVASC) 10 MG tablet TAKE ONE (1) TABLET EACH DAY 90 tablet 3  . apixaban (ELIQUIS) 5 MG TABS tablet Take 1 tablet (5 mg total) by mouth 2 (two) times daily. 60 tablet 3  . atorvastatin (LIPITOR) 80 MG tablet Take 1 tablet (80 mg total) by mouth daily at 6 PM. 30 tablet 6  . cholecalciferol (VITAMIN D) 1000 UNITS tablet Take 1,000 Units by mouth at bedtime.     . clopidogrel (PLAVIX) 75 MG tablet Take 1 tablet (75 mg total) by mouth daily. 90 tablet 3  . diltiazem (CARDIZEM) 30 MG tablet Take 1 tablet (30 mg total) by mouth 2 (two) times daily as needed (palpitations). 30 tablet 1  . furosemide (LASIX) 20 MG tablet Take 2 tablets (40 mg total) by mouth daily.    . insulin NPH-regular Human (NOVOLIN 70/30) (70-30) 100 UNIT/ML injection Inject 50 Units into the skin 2 (two) times daily with a meal.     . labetalol (NORMODYNE) 200 MG tablet Take 2 tablets (400 mg total) by mouth 2 (two) times daily. 120 tablet 1  . levothyroxine (SYNTHROID, LEVOTHROID) 125 MCG tablet Take 125 mcg by mouth daily.    [name] losartan (COZAAR) 100 MG tablet TAKE ONE (1) TABLET BY MOUTH EVERY DAY 90 tablet 3  . magnesium  oxide (MAG-OX) 400 MG tablet Take 400 mg by mouth daily.    . metFORMIN (GLUCOPHAGE) 500 MG tablet Take 500 mg by mouth 3 (three) times daily with meals.     . nitroGLYCERIN (NITROSTAT) 0.4 MG SL tablet Place 1 tablet (0.4 mg total) under the tongue every 5 (five) minutes as needed for chest pain. 25 tablet 12  . Omega-3 Fatty Acids (FISH OIL) 1200 MG CAPS Take 1 capsule (1,200 mg total) by mouth daily.    . potassium chloride SA (KLOR-CON M20) 20 MEQ tablet Take 1 tablet (20 mEq total) by mouth daily. 90 tablet 3  . rOPINIRole (REQUIP) 0.5 MG tablet Take 0.5 mg by mouth at bedtime.   0   No current facility-administered medications for this visit.    Discontinued Meds:    Medications Discontinued During This Encounter  Medication Reason  . levothyroxine (SYNTHROID, LEVOTHROID) 150 MCG tablet Error    Patient Active Problem List   Diagnosis Date Noted  . Atrial fibrillation with RVR ([place]) 10/28/2015  . Atrial fibrillation with tachycardic ventricular rate ([place]) 09/17/2015  . Chest pain 08/02/2015  . Atrial fibrillation with rapid ventricular response ([place])   .  Pain with urination 05/08/2015  . NSTEMI (non-ST elevated myocardial infarction) ([place]) 04/02/2015  . CAD (coronary artery disease), native coronary artery 11/30/2014  . 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 ([place])   . PAD (peripheral artery disease) ([place]) 12/26/2013  . Superficial fungus infection of skin 06/29/2013  . UTI (urinary tract infection) 05/08/2013  . Hypokalemia 03/05/2012  . B12 deficiency anemia 03/02/2012  . Bronchospasm 03/02/2012  . Community acquired bacterial pneumonia 03/01/2012  . Acute respiratory failure ([place]) 03/01/2012  . DM (diabetes mellitus), type 2, uncontrolled ([place]) 02/29/2012  . Paroxysmal atrial fibrillation ([place]) 02/28/2012  . Hypothyroid 02/28/2012  . RLQ abdominal pain 11/24/2010  . OVERWEIGHT/OBESITY 06/03/2010  . Essential  hypertension 06/03/2010  . Hyperlipidemia 12/27/2009  . CAD, NATIVE VESSEL 05/17/2009  . Palpitations 05/17/2009  . FRACTURE, TOE 12/06/2007    LABS    Component Value Date/Time   NA 142 10/29/2015 0508   NA 141 10/28/2015 0448   NA 139 10/14/2015 0931   K 4.1 10/29/2015 0508   K 3.8 10/28/2015 0448   K 4.4 10/14/2015 0931   CL 112* 10/29/2015 0508   CL 108 10/28/2015 0448   CL 106 10/14/2015 0931   CO2 24 10/29/2015 0508   CO2 23 10/28/2015 0448   CO2 24 10/14/2015 0931   GLUCOSE 140* 10/29/2015 0508   GLUCOSE 236* 10/28/2015 0448   GLUCOSE 197* 10/14/2015 0931   BUN 16 10/29/2015 0508   BUN 16 10/28/2015 0448   BUN 18 10/14/2015 0931   CREATININE 0.85 10/29/2015 0508   CREATININE 0.85 10/28/2015 0448   CREATININE 0.96* 10/14/2015 0931   CREATININE 1.33* 10/07/2015 0857   CREATININE 1.20* 10/02/2015 1550   CREATININE 0.93 10/02/2015 0917   CALCIUM 8.6* 10/29/2015 0508   CALCIUM 8.9 10/28/2015 0448   CALCIUM 8.2* 10/14/2015 0931   GFRNONAA >60 10/29/2015 0508   GFRNONAA >60 10/28/2015 0448   GFRNONAA 45* 10/02/2015 1550   GFRAA >60 10/29/2015 0508   GFRAA >60 10/28/2015 0448   GFRAA 52* 10/02/2015 1550   CMP     Component Value Date/Time   NA 142 10/29/2015 0508   K 4.1 10/29/2015 0508   CL 112* 10/29/2015 0508   CO2 24 10/29/2015 0508   GLUCOSE 140* 10/29/2015 0508   BUN 16 10/29/2015 0508   CREATININE 0.85 10/29/2015 0508   CREATININE 0.96* 10/14/2015 0931   CALCIUM 8.6* 10/29/2015 0508   PROT 5.3* 10/02/2015 0917   ALBUMIN 3.4* 10/02/2015 0917   AST 7* 10/02/2015 0917   ALT 9 10/02/2015 0917   ALKPHOS 53 10/02/2015 0917   BILITOT 1.1 10/02/2015 0917   GFRNONAA >60 10/29/2015 0508   GFRAA >60 10/29/2015 0508       Component Value Date/Time   WBC 6.8 10/29/2015 0508   WBC 7.9 10/28/2015 0448   WBC 14.6* 10/02/2015 1550   HGB 11.0* 10/29/2015 0508   HGB 11.1* 10/28/2015 0448   HGB 10.8* 10/02/2015 1550   HCT 34.0* 10/29/2015 0508   HCT 33.9*  10/28/2015 0448   HCT 32.4* 10/02/2015 1550   MCV 91.9 10/29/2015 0508   MCV 92.4 10/28/2015 0448   MCV 92.3 10/02/2015 1550    Lipid Panel     Component Value Date/Time   CHOL 97* 10/02/2015 0850   TRIG 117 10/02/2015 0850   HDL 36* 10/02/2015 0850   CHOLHDL 2.7 10/02/2015 0850   VLDL 23 10/02/2015 0850   LDLCALC 38 10/02/2015 0850  ABG    Component Value Date/Time   PHART 7.415 03/01/2012 2125   PCO2ART 32.4* 03/01/2012 2125   PO2ART 59.8* 03/01/2012 2125   HCO3 20.4 03/01/2012 2125   TCO2 22 02/15/2015 1344   ACIDBASEDEF 3.4* 03/01/2012 2125   O2SAT 93.1 03/01/2012 2125     Lab Results  Component Value Date   TSH 0.200* 10/28/2015   BNP (last 3 results)  Recent Labs  09/16/15 2228 10/02/15 1550  BNP 123.0* 459.0*    ProBNP (last 3 results) No results for input(s): PROBNP in the last 8760 hours.  Cardiac Panel (last 3 results) No results for input(s): CKTOTAL, CKMB, TROPONINI, RELINDX in the last 72 hours.  Iron/TIBC/Ferritin/ %Sat    Component Value Date/Time   IRON 16* 03/02/2012 0854   TIBC 211* 03/02/2012 0854   FERRITIN 433* 03/02/2012 0854   IRONPCTSAT 8* 03/02/2012 0854     EKG Orders placed or performed during the hospital encounter of 10/28/15  . EKG 12-Lead  . EKG 12-Lead  . ED EKG within 10 minutes  . ED EKG within 10 minutes  . EKG  . EKG 12-Lead  . EKG 12-Lead     Prior Assessment and Plan Problem List as of 11/11/2015      Cardiovascular and Mediastinum   CAD, NATIVE VESSEL   Last Assessment & Plan 11/06/2014 Office Visit Written 11/06/2014  5:15 PM by Wellington Hampshire, MD    She has no symptoms suggestive of angina. Continue medical therapy.      Essential hypertension   Last Assessment & Plan 11/06/2014 Office Visit Written 11/06/2014  5:15 PM by Wellington Hampshire, MD    Blood pressure is well controlled on current medication.      Paroxysmal atrial fibrillation [facility])   Last Assessment & Plan 11/01/2012 Office Visit  Written 11/01/2012 10:46 AM by Renella Cunas, MD    No clinical recurrence.      PAD (peripheral artery disease) [facility])   Last Assessment & Plan 11/06/2014 Office Visit Written 11/06/2014  5:15 PM by Wellington Hampshire, MD    Claudication overall improved with cilostazol and a walking program. Current hip pain seems to be due to bursitis and not PAD. There was no evidence of aortoiliac disease.      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 (HCC)   CAD (coronary artery disease), native coronary artery   NSTEMI (non-ST elevated myocardial infarction) ([place])   Atrial fibrillation with rapid ventricular response (HCC)   Atrial fibrillation with tachycardic ventricular rate (HCC)   Atrial fibrillation with RVR (HCC)     Respiratory   Community acquired bacterial pneumonia   Acute respiratory failure (HCC)   Bronchospasm     Endocrine   Hypothyroid   DM (diabetes mellitus), type 2, uncontrolled (HCC)     Musculoskeletal and Integument   FRACTURE, TOE     Genitourinary   UTI (urinary tract infection)     Other   Hyperlipidemia   Last Assessment & Plan 11/06/2014 Office Visit Written 11/06/2014  5:16 PM by Wellington Hampshire, MD    Continue treatment with atorvastatin with a target LDL of less than 70.       Palpitations   OVERWEIGHT/OBESITY   Last Assessment & Plan 05/05/2011 Office Visit Written 05/05/2011 11:37 AM by Renella Cunas, MD    Markedly improved. Continue to keep weight off.      RLQ abdominal pain   Last Assessment &  Plan 11/24/2010 Office Visit Edited 12/04/2010  2:20 PM by Orvil Feil, NP    70 year old female with several month hx of RLQ pain, achy, intermittent in past, now constant for past several weeks. Not relieved by anything. Exacerbated by movement. No change in bowel habits. Has BM 3-4X per day. No melena or brbpr. Afebrile, no chills. No lack of appetite; does have purposeful wt loss secondary to diagnosis of  diabetes. Has had multiple abdominal surgeries in past: see PMH/PSH. 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 Unknown path report at this time; requesting records.  Likely needs updated colonoscopy, but we will proceed with CT abd/pelvis to assess for other etiology prior to invasive procedure. Differentials include adhesive disease, less likely diverticular origin, functional abdominal pain.   Proceed with CT abd/pelvis Creatinine prior; hold metformin for 48 hours after scan Obtain path from polyp in 2005 Likely needs updated colonoscopy; will review CT first The R/B/A have been discussed in detail with pt regarding a colonoscopy; she states understanding and is willing to proceed if necessary.   ADDENDUM: ifobt +, CBC nl. Pt scheduled for 6/15 colonoscopy      B12 deficiency anemia   Hypokalemia   Superficial fungus infection of skin   Pain with urination   Chest pain       Imaging: Dg Chest Portable 1 View  10/28/2015  CLINICAL DATA:  Chest pain and midsternal pressure. Dyspnea. Onset tonight. EXAM: PORTABLE CHEST 1 VIEW COMPARISON:  10/02/2015. FINDINGS: A single AP portable view of the chest demonstrates no focal airspace consolidation or alveolar edema. The lungs are grossly clear. There is no large effusion or pneumothorax. Cardiac and mediastinal contours appear unremarkable. IMPRESSION: No active disease. Electronically Signed   By: Andreas Newport M.D.   On: 10/28/2015 05:15

## 2015-12-06 ENCOUNTER — Ambulatory Visit: Payer: PPO | Admitting: Cardiovascular Disease

## 2015-12-09 DIAGNOSIS — E039 Hypothyroidism, unspecified: Secondary | ICD-10-CM | POA: Diagnosis not present

## 2015-12-09 DIAGNOSIS — I4891 Unspecified atrial fibrillation: Secondary | ICD-10-CM | POA: Diagnosis not present

## 2015-12-09 DIAGNOSIS — Z6827 Body mass index (BMI) 27.0-27.9, adult: Secondary | ICD-10-CM | POA: Diagnosis not present

## 2015-12-09 DIAGNOSIS — Z1389 Encounter for screening for other disorder: Secondary | ICD-10-CM | POA: Diagnosis not present

## 2015-12-16 ENCOUNTER — Encounter: Payer: Self-pay | Admitting: Cardiovascular Disease

## 2015-12-16 ENCOUNTER — Ambulatory Visit (INDEPENDENT_AMBULATORY_CARE_PROVIDER_SITE_OTHER): Payer: PPO | Admitting: Cardiovascular Disease

## 2015-12-16 VITALS — BP 118/62 | HR 57 | Ht 63.0 in | Wt 151.0 lb

## 2015-12-16 DIAGNOSIS — I739 Peripheral vascular disease, unspecified: Secondary | ICD-10-CM

## 2015-12-16 DIAGNOSIS — E785 Hyperlipidemia, unspecified: Secondary | ICD-10-CM

## 2015-12-16 DIAGNOSIS — I480 Paroxysmal atrial fibrillation: Secondary | ICD-10-CM | POA: Diagnosis not present

## 2015-12-16 DIAGNOSIS — I10 Essential (primary) hypertension: Secondary | ICD-10-CM | POA: Diagnosis not present

## 2015-12-16 DIAGNOSIS — Z87898 Personal history of other specified conditions: Secondary | ICD-10-CM

## 2015-12-16 DIAGNOSIS — I25118 Atherosclerotic heart disease of native coronary artery with other forms of angina pectoris: Secondary | ICD-10-CM

## 2015-12-16 DIAGNOSIS — Z9289 Personal history of other medical treatment: Secondary | ICD-10-CM

## 2015-12-16 DIAGNOSIS — I252 Old myocardial infarction: Secondary | ICD-10-CM

## 2015-12-16 DIAGNOSIS — I5032 Chronic diastolic (congestive) heart failure: Secondary | ICD-10-CM | POA: Diagnosis not present

## 2015-12-16 MED ORDER — APIXABAN 5 MG PO TABS: 5.0000 mg | ORAL_TABLET | Freq: Two times a day (BID) | ORAL | Status: DC

## 2015-12-16 NOTE — Patient Instructions (Signed)
Medication Instructions:   Your physician recommends that you continue on your current medications as directed. Please refer to the Current Medication list given to you today.  Labwork:  NONE  Testing/Procedures:  NONE  Follow-Up:  Your physician recommends that you schedule a follow-up appointment in: 3 months.  Any Other Special Instructions Will Be Listed Below (If Applicable).  If you need a refill on your cardiac medications before your next appointment, please call your pharmacy. 

## 2015-12-16 NOTE — Progress Notes (Signed)
Patient ID: Virginia Huber, female   DOB: 24-Mar-1946, 70 y.o.   MRN: [id number]      SUBJECTIVE: The patient presents for routine follow-up. She has coronary artery disease, atrial fibrillation, and chronic diastolic heart failure. She was hospitalized for rapid atrial fibrillation in May.  She was doing well and denies shortness of breath, chest pain, and leg swelling. She seldom has palpitations and has not had to take diltiazem in the past several weeks.  Review of Systems: As per "subjective", otherwise negative.    Current Outpatient Prescriptions  Medication Sig Dispense Refill  . amLODipine (NORVASC) 10 MG tablet TAKE ONE (1) TABLET EACH DAY 90 tablet 3  . apixaban (ELIQUIS) 5 MG TABS tablet Take 1 tablet (5 mg total) by mouth 2 (two) times daily. 60 tablet 3  . atorvastatin (LIPITOR) 80 MG tablet Take 1 tablet (80 mg total) by mouth daily at 6 PM. 30 tablet 6  . cholecalciferol (VITAMIN D) 1000 UNITS tablet Take 1,000 Units by mouth at bedtime.     . clopidogrel (PLAVIX) 75 MG tablet Take 1 tablet (75 mg total) by mouth daily. 90 tablet 3  . diltiazem (CARDIZEM) 30 MG tablet Take 1 tablet (30 mg total) by mouth 2 (two) times daily as needed (palpitations). 30 tablet 1  . furosemide (LASIX) 20 MG tablet Take 2 tablets (40 mg total) by mouth daily.    . insulin NPH-regular Human (NOVOLIN 70/30) (70-30) 100 UNIT/ML injection Inject 50 Units into the skin 2 (two) times daily with a meal.     . labetalol (NORMODYNE) 200 MG tablet Take 2 tablets (400 mg total) by mouth 2 (two) times daily. 120 tablet 1  . levothyroxine (SYNTHROID, LEVOTHROID) 125 MCG tablet Take 125 mcg by mouth daily.    [name] losartan (COZAAR) 100 MG tablet TAKE ONE (1) TABLET BY MOUTH EVERY DAY 90 tablet 3  . magnesium oxide (MAG-OX) 400 MG tablet Take 400 mg by mouth daily.    . metFORMIN (GLUCOPHAGE) 500 MG tablet Take 500 mg by mouth 3 (three) times daily with meals.     . nitroGLYCERIN (NITROSTAT) 0.4 MG SL tablet  Place 1 tablet (0.4 mg total) under the tongue every 5 (five) minutes as needed for chest pain. 25 tablet 12  . Omega-3 Fatty Acids (FISH OIL) 1200 MG CAPS Take 1 capsule (1,200 mg total) by mouth daily.    . potassium chloride SA (KLOR-CON M20) 20 MEQ tablet Take 1 tablet (20 mEq total) by mouth daily. 90 tablet 3  . rOPINIRole (REQUIP) 0.5 MG tablet Take 0.5 mg by mouth at bedtime.   0   No current facility-administered medications for this visit.    Past Medical History  Diagnosis Date  . HTN (hypertension)   . Heart block   . Hypothyroidism   . Ovarian tumor   . Coronary artery disease 11/30/2014    a. remote MI. b. h/o PTCA with scoring balloon to OM1 11/2014. c. NSTEMI 03/2015 s/p DES to prox-mid Cx. d. NSTEMI 07/2015 s/p scoring balloon/PTCA/DES to dRCA with PAF during that admission  . UTI (urinary tract infection) 05/08/2013  . Superficial fungus infection of skin 06/29/2013  . TIA (transient ischemic attack) 08/2001; ~ 2006  . GERD (gastroesophageal reflux disease)   . Arthritis   . Hypercholesteremia   . Myocardial infarction ([place]) 02/2012  . Type II diabetes mellitus ([place])   . Cutaneous lupus erythematosus   . History of blood transfusion 1980's    2nd  surgical procedures  . Pain with urination 05/08/2015  . Atrial fibrillation and flutter ([place])     a. h/o PAF/flutter during admission in 2013 for PNA. b. PAF during adm for NSTEMI 07/2015.  [name] PAD (peripheral artery disease) ([place])     a. s/p LE angio 2015; followed by Dr. Fletcher Anon - managed medically.  . B12 deficiency anemia     Past Surgical History  Procedure Laterality Date  . 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 Partial hysterectomy  1970's    left ovaries, then ovaries removed later due tumors   . Left oophorectomy  05/1979    nicked bowel, peritonitis, colostomy; colostomy reversed 1981   . Nuclear med stress test  10/2011    Small area of mild ischemia inferoapically.  . Abdominal aortagram N/A 01/03/2014    Procedure: ABDOMINAL  [name];  Surgeon: Wellington Hampshire, MD;  Location: [facility] CATH LAB;  Service: Cardiovascular;  Laterality: N/A;  . Lower extremity angiogram N/A 01/03/2014    Procedure: LOWER EXTREMITY ANGIOGRAM;  Surgeon: Wellington Hampshire, MD;  Location: [facility] CATH LAB;  Service: Cardiovascular;  Laterality: N/A;  . Colostomy reversal  11/1979  . Colonoscopy  2005    Dr. Laural Golden: pancolonic divericula, polyp, path unknown currently  . Appendectomy  1970's  . Cholecystectomy open  1990's  . Excisional hemorrhoidectomy  1970's  . Abdominal hysterectomy  1972    "partial"  . 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 Eye surgery Left ~ 2001    "smoothed out wrinkle"  . Cardiac catheterization  2008    Tiny OM-2 with 90% narrowing. Med tx.  . Cardiac catheterization N/A 11/30/2014    Procedure: Left Heart Cath and Coronary Angiography;  Surgeon: Troy Sine, MD; LAD 20%, CFX 50%, OM1 95%, right PLB 30%, LV normal   . Cardiac catheterization N/A 11/30/2014    Procedure: Coronary Balloon Angioplasty;  Surgeon: Troy Sine, MD;  Angiosculpt scoring balloon and PTCA to the OM1 reducing stenosis from 95% to less than 10%  . Right oophorectomy  1970's  . Cardiac catheterization N/A 04/03/2015    Procedure: Left Heart Cath and Coronary Angiography;  Surgeon: Jolaine Artist, MD; dLAD 50%, CFX 90%, OM1 100%, PLA 15%, LVEDP 13    . Cardiac catheterization N/A 04/03/2015    Procedure: Coronary Stent Intervention;  Surgeon: Sherren Mocha, MD; 3.0x18 mm Xience DES to the CFX    . Cardiac catheterization N/A 08/02/2015    Procedure: Left Heart Cath and Coronary Angiography;  Surgeon: Troy Sine, MD;  Location: [facility] CV LAB;  Service: Cardiovascular;  Laterality: N/A;  . Cardiac catheterization N/A 08/02/2015    Procedure: Coronary Stent Intervention;  Surgeon: Troy Sine, MD;  Location: [place] CV LAB;  Service: Cardiovascular;  Laterality: N/A;  . Cardiac stents      Social History    Social History  . Marital Status: Married    Spouse Name: N/A  . Number of Children: N/A  . Years of Education: N/A   Occupational History  . Retired     Research officer, political party   Social History Main Topics  . Smoking status: Never Smoker   . Smokeless tobacco: Never Used  . Alcohol Use: No  . Drug Use: No  . Sexual Activity: No     Comment: hyst   Other Topics Concern  . Not on file   Social History Narrative     Filed Vitals:   12/16/15  1406  BP: 118/62  Pulse: 57  Height: 5\' 3"  (1.6 m)  Weight: 151 lb (68.493 kg)    PHYSICAL EXAM General: NAD HEENT: Normal. Neck: No JVD, no thyromegaly. Lungs: Clear to auscultation bilaterally with normal respiratory effort. CV: Nondisplaced PMI.  Regular rate and rhythm, normal S1/S2, no S3/S4, no murmur. No pretibial or periankle edema.     Abdomen: Soft, nontender, no distention.  Neurologic: Alert and oriented.  Psych: Normal affect. Skin: Normal. Musculoskeletal: No gross deformities.    ECG: Most recent ECG reviewed.      ASSESSMENT AND PLAN: 1. CAD s/p PCI of LCx: Symptomatically stable. Continue Lipitor, Plavix, losartan, and labetalol.   2. Essential HTN: Controlled on present therapy. Continue labetalol, losartan, and amlodipine.  3. Hyperlipidemia: Lipids from 10/02/15 previously reviewed. Continue Lipitor 20 mg daily and fish oil for elevated triglycerides.  4. PVD: Previously described claudication pain and I encouraged to make appt soon with vascular surgery.  5. Chronic diastolic heart failure: Euvolemic. No changes.  6. Atrial fibrillation: Currently in a regular rhythm. Continue Eliquis and labetalol and diltiazem as needed.  Dispo: fu 3 months.   Kate Sable, M.D., F.A.C.C.

## 2015-12-23 DIAGNOSIS — E1165 Type 2 diabetes mellitus with hyperglycemia: Secondary | ICD-10-CM | POA: Diagnosis not present

## 2015-12-23 DIAGNOSIS — E7800 Pure hypercholesterolemia, unspecified: Secondary | ICD-10-CM | POA: Diagnosis not present

## 2015-12-23 DIAGNOSIS — E039 Hypothyroidism, unspecified: Secondary | ICD-10-CM | POA: Diagnosis not present

## 2015-12-25 ENCOUNTER — Ambulatory Visit (INDEPENDENT_AMBULATORY_CARE_PROVIDER_SITE_OTHER): Payer: PPO | Admitting: Gastroenterology

## 2015-12-25 ENCOUNTER — Encounter: Payer: Self-pay | Admitting: Gastroenterology

## 2015-12-25 VITALS — BP 135/54 | HR 60 | Temp 97.3°F | Ht 63.0 in | Wt 151.8 lb

## 2015-12-25 DIAGNOSIS — K529 Noninfective gastroenteritis and colitis, unspecified: Secondary | ICD-10-CM | POA: Diagnosis not present

## 2015-12-25 NOTE — Patient Instructions (Signed)
We will wait on a colonoscopy for right now.  Start taking the Zenpep 2 capsules with each meal. This is to see if it helps with the chronic diarrhea. Give it about 10 days. Email me on my chart with an update in about 1 week.   I will be on vacation starting July 13th, so email me July 11th or 12th at the latest so I can know how you are doing. Otherwise, you can call the office and another provider can help. I will be back in the office on July 25th.   We will see you in 3 months!

## 2015-12-25 NOTE — Progress Notes (Signed)
Primary Care Physician:  Purvis Kilts, MD Primary Gastroenterologist:  Dr. Oneida Alar   Chief Complaint  Patient presents with  . Colonoscopy    on Eliquis    HPI:   Virginia Huber is a 70 y.o. female presenting today with a remote history of colon polyp in 2005, last colonoscopy by Dr. Oneida Alar in 2012 with normal TI and normal colonic biospies, returning to discuss need for routine screening colonoscopy. Recommended in 5-10 years. Has a history of chronic diarrhea. States it is not every day but sometimes has no control over it. Sometimes happens at night. Not a lot of abdominal pain. No weight loss. Stool at times will look greasy. "Doesn't look normal". Watery. Denies feeling bloated. Denies history of smoking. Postprandial. No rectal bleeding.   She tells me that she has had multiple cardiac issues over the past 12 months. History of remote MI, then NSTEMI in Oct 2016, then NSTEMI Feb 2017, stent placements, hospitalizations for afib. Heme negative Jan 2017.   Past Medical History  Diagnosis Date  . HTN (hypertension)   . Heart block   . Hypothyroidism   . Ovarian tumor   . Coronary artery disease 11/30/2014    a. remote MI. b. h/o PTCA with scoring balloon to OM1 11/2014. c. NSTEMI 03/2015 s/p DES to prox-mid Cx. d. NSTEMI 07/2015 s/p scoring balloon/PTCA/DES to dRCA with PAF during that admission  . UTI (urinary tract infection) 05/08/2013  . Superficial fungus infection of skin 06/29/2013  . TIA (transient ischemic attack) 08/2001; ~ 2006  . GERD (gastroesophageal reflux disease)   . Arthritis   . Hypercholesteremia   . Myocardial infarction ([place]) 02/2012  . Type II diabetes mellitus ([place])   . Cutaneous lupus erythematosus   . History of blood transfusion 1980's    2nd surgical procedures  . Pain with urination 05/08/2015  . Atrial fibrillation and flutter ([place])     a. h/o PAF/flutter during admission in 2013 for PNA. b. PAF during adm for NSTEMI 07/2015.  [name] PAD  (peripheral artery disease) ([place])     a. s/p LE angio 2015; followed by Dr. Fletcher Anon - managed medically.  . B12 deficiency anemia     Past Surgical History  Procedure Laterality Date  . 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 Partial hysterectomy  1970's    left ovaries, then ovaries removed later due tumors   . Left oophorectomy  05/1979    nicked bowel, peritonitis, colostomy; colostomy reversed 1981   . Nuclear med stress test  10/2011    Small area of mild ischemia inferoapically.  . Abdominal aortagram N/A 01/03/2014    Procedure: ABDOMINAL [name];  Surgeon: Wellington Hampshire, MD;  Location: [facility] CATH LAB;  Service: Cardiovascular;  Laterality: N/A;  . Lower extremity angiogram N/A 01/03/2014    Procedure: LOWER EXTREMITY ANGIOGRAM;  Surgeon: Wellington Hampshire, MD;  Location: [place] CATH LAB;  Service: Cardiovascular;  Laterality: N/A;  . Colostomy reversal  11/1979  . Colonoscopy  2005    Dr. Laural Golden: pancolonic divericula, polyp, path unknown currently  . Appendectomy  1970's  . Cholecystectomy open  1990's  . Excisional hemorrhoidectomy  1970's  . Abdominal hysterectomy  1972    "partial"  . 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 Eye surgery Left ~ 2001    "smoothed out wrinkle"  . Cardiac catheterization  2008    Tiny OM-2 with 90% narrowing. Med tx.  . Cardiac  catheterization N/A 11/30/2014    Procedure: Left Heart Cath and Coronary Angiography;  Surgeon: Troy Sine, MD; LAD 20%, CFX 50%, OM1 95%, right PLB 30%, LV normal   . Cardiac catheterization N/A 11/30/2014    Procedure: Coronary Balloon Angioplasty;  Surgeon: Troy Sine, MD;  Angiosculpt scoring balloon and PTCA to the OM1 reducing stenosis from 95% to less than 10%  . Right oophorectomy  1970's  . Cardiac catheterization N/A 04/03/2015    Procedure: Left Heart Cath and Coronary Angiography;  Surgeon: Jolaine Artist, MD; dLAD 50%, CFX 90%, OM1 100%, PLA 15%, LVEDP 13    . Cardiac catheterization N/A 04/03/2015     Procedure: Coronary Stent Intervention;  Surgeon: Sherren Mocha, MD; 3.0x18 mm Xience DES to the CFX    . Cardiac catheterization N/A 08/02/2015    Procedure: Left Heart Cath and Coronary Angiography;  Surgeon: Troy Sine, MD;  Location: [facility] CV LAB;  Service: Cardiovascular;  Laterality: N/A;  . Cardiac catheterization N/A 08/02/2015    Procedure: Coronary Stent Intervention;  Surgeon: Troy Sine, MD;  Location: [facility] CV LAB;  Service: Cardiovascular;  Laterality: N/A;  . Cardiac stents    . Colonoscopy  2012    Dr. Oneida Alar: Normal TI, scattered diverticula in entire colon, small internal hemorrhoids, normal colon biopsies. Colonoscopy in 5-10 years.     Current Outpatient Prescriptions  Medication Sig Dispense Refill  . amLODipine (NORVASC) 10 MG tablet TAKE ONE (1) TABLET EACH DAY 90 tablet 3  . apixaban (ELIQUIS) 5 MG TABS tablet Take 1 tablet (5 mg total) by mouth 2 (two) times daily. 56 tablet 0  . atorvastatin (LIPITOR) 80 MG tablet Take 1 tablet (80 mg total) by mouth daily at 6 PM. 30 tablet 6  . cholecalciferol (VITAMIN D) 1000 UNITS tablet Take 1,000 Units by mouth at bedtime.     . clopidogrel (PLAVIX) 75 MG tablet Take 1 tablet (75 mg total) by mouth daily. 90 tablet 3  . diltiazem (CARDIZEM) 30 MG tablet Take 1 tablet (30 mg total) by mouth 2 (two) times daily as needed (palpitations). 30 tablet 1  . furosemide (LASIX) 20 MG tablet Take 2 tablets (40 mg total) by mouth daily.    . insulin NPH-regular Human (NOVOLIN 70/30) (70-30) 100 UNIT/ML injection Inject 50 Units into the skin 2 (two) times daily with a meal.     . labetalol (NORMODYNE) 200 MG tablet Take 2 tablets (400 mg total) by mouth 2 (two) times daily. 120 tablet 1  . levothyroxine (SYNTHROID, LEVOTHROID) 125 MCG tablet Take 125 mcg by mouth daily.    [name] losartan (COZAAR) 100 MG tablet TAKE ONE (1) TABLET BY MOUTH EVERY DAY 90 tablet 3  . magnesium oxide (MAG-OX) 400 MG tablet Take 400 mg by mouth  daily.    . metFORMIN (GLUCOPHAGE) 500 MG tablet Take 500 mg by mouth 3 (three) times daily with meals.     . Omega-3 Fatty Acids (FISH OIL) 1200 MG CAPS Take 1 capsule (1,200 mg total) by mouth daily.    . potassium chloride SA (KLOR-CON M20) 20 MEQ tablet Take 1 tablet (20 mEq total) by mouth daily. 90 tablet 3  . rOPINIRole (REQUIP) 0.5 MG tablet Take 0.5 mg by mouth at bedtime.   0  . nitroGLYCERIN (NITROSTAT) 0.4 MG SL tablet Place 1 tablet (0.4 mg total) under the tongue every 5 (five) minutes as needed for chest pain. (Patient not taking: Reported on 12/25/2015) 25  tablet 12   No current facility-administered medications for this visit.    Allergies as of 12/25/2015 - Review Complete 12/25/2015  Allergen Reaction Noted  . Penicillins Hives   . Percocet [oxycodone-acetaminophen] Nausea And Vomiting 02/27/2012    Family History  Problem Relation Age of Onset  . Heart disease Mother     deceased  . Heart disease Father     deceased, heart disease  . Colon cancer Neg Hx   . Diabetes Brother   . Heart disease Brother   . Thyroid disease Brother   . Heart disease Sister   . Heart disease Brother   . Thyroid disease Brother   . Lupus Daughter     Social History   Social History  . Marital Status: Married    Spouse Name: N/A  . Number of Children: N/A  . Years of Education: N/A   Occupational History  . Retired     Research officer, political party   Social History Main Topics  . Smoking status: Never Smoker   . Smokeless tobacco: Never Used     Comment: Never smoked  . Alcohol Use: No  . Drug Use: No  . Sexual Activity: No     Comment: hyst   Other Topics Concern  . Not on file   Social History Narrative    Review of Systems: Gen: Denies any fever, chills, fatigue, weight loss, lack of appetite.  CV: +palpitations, +chest pain with afib  Resp: +SOB  GI: see HPI  GU : Denies urinary burning, urinary frequency, urinary hesitancy MS: +joint pain   Derm: Denies rash,  itching, dry skin Psych: +anxiety  Heme: Denies bruising, bleeding, and enlarged lymph nodes.  Physical Exam: BP 135/54 mmHg  Pulse 60  Temp(Src) 97.3 F (36.3 C) (Oral)  Ht 5\' 3"  (1.6 m)  Wt 151 lb 12.8 oz (68.856 kg)  BMI 26.90 kg/m2 General:   Alert and oriented. Pleasant and cooperative. Well-nourished and well-developed.  Head:  Normocephalic and atraumatic. Eyes:  Without icterus, sclera clear and conjunctiva pink.  Ears:  Normal auditory acuity. Nose:  No deformity, discharge,  or lesions. Lungs:  Clear to auscultation bilaterally. No wheezes, rales, or rhonchi. No distress.  Heart:  S1, S2 present irregularly irregularly  Abdomen:  +BS, soft, non-tender and non-distended. No HSM noted. No guarding or rebound. No masses appreciated.  Rectal:  Deferred  Msk:  Symmetrical without gross deformities. Normal posture. Extremities:  Without edema. Neurologic:  Alert and  oriented x4;  grossly normal neurologically. Psych:  Alert and cooperative. Normal mood and affect.  Lab Results  Component Value Date   WBC 6.8 10/29/2015   HGB 11.0* 10/29/2015   HCT 34.0* 10/29/2015   MCV 91.9 10/29/2015   PLT 208 10/29/2015

## 2015-12-30 ENCOUNTER — Other Ambulatory Visit: Payer: Self-pay

## 2015-12-30 DIAGNOSIS — E039 Hypothyroidism, unspecified: Secondary | ICD-10-CM | POA: Diagnosis not present

## 2015-12-30 DIAGNOSIS — I10 Essential (primary) hypertension: Secondary | ICD-10-CM | POA: Diagnosis not present

## 2015-12-30 DIAGNOSIS — E7800 Pure hypercholesterolemia, unspecified: Secondary | ICD-10-CM | POA: Diagnosis not present

## 2015-12-30 DIAGNOSIS — E1165 Type 2 diabetes mellitus with hyperglycemia: Secondary | ICD-10-CM | POA: Diagnosis not present

## 2015-12-30 MED ORDER — LABETALOL HCL 200 MG PO TABS: 400.0000 mg | ORAL_TABLET | Freq: Two times a day (BID) | ORAL | Status: DC

## 2015-12-30 NOTE — Telephone Encounter (Signed)
Refill complete 

## 2015-12-30 NOTE — Assessment & Plan Note (Signed)
70 year old female with history of chronic diarrhea but without concerning features. Last colonoscopy 2012 with normal TI and normal colonic biopsies. Heme negative in Jan 2017. As she has had numerous cardiac issues over the past 12 months, will hold off on elective colonoscopy as it was recommended in 2012 to be repeated in 5-10 years. For now, we will trial samples of Zenpep (40,000 units take 2 with meals). She is to let us know how this works. If this does well, we can send in a prescription. If not, we will trial Colestid. Return in 3 months.

## 2015-12-31 ENCOUNTER — Encounter: Payer: Self-pay | Admitting: Gastroenterology

## 2015-12-31 ENCOUNTER — Other Ambulatory Visit: Payer: Self-pay | Admitting: Gastroenterology

## 2015-12-31 MED ORDER — COLESTIPOL HCL 1 G PO TABS: 1.0000 g | ORAL_TABLET | Freq: Two times a day (BID) | ORAL | Status: DC

## 2015-12-31 NOTE — Progress Notes (Signed)
cc'ed to pcp °

## 2016-01-06 DIAGNOSIS — E113291 Type 2 diabetes mellitus with mild nonproliferative diabetic retinopathy without macular edema, right eye: Secondary | ICD-10-CM | POA: Diagnosis not present

## 2016-01-06 DIAGNOSIS — H4720 Unspecified optic atrophy: Secondary | ICD-10-CM | POA: Diagnosis not present

## 2016-01-06 DIAGNOSIS — H35033 Hypertensive retinopathy, bilateral: Secondary | ICD-10-CM | POA: Diagnosis not present

## 2016-01-06 DIAGNOSIS — H348322 Tributary (branch) retinal vein occlusion, left eye, stable: Secondary | ICD-10-CM | POA: Diagnosis not present

## 2016-02-05 ENCOUNTER — Telehealth: Payer: Self-pay | Admitting: *Deleted

## 2016-02-05 NOTE — Telephone Encounter (Signed)
Letter received from [employer] stating that the patient is not eligible to receive Eliquis because the product is covered by insurance.

## 2016-02-07 ENCOUNTER — Telehealth: Payer: Self-pay

## 2016-02-07 NOTE — Telephone Encounter (Signed)
[employer] Patient assistance denied help for Eliqis.I called [phone] ,hit option 1 and talked with rep who said they need patient to call and state specifically she "cannot her medications" and then they could assist her.   I left message on both home and mobile phones

## 2016-02-12 ENCOUNTER — Telehealth: Payer: Self-pay | Admitting: *Deleted

## 2016-02-12 DIAGNOSIS — Z0001 Encounter for general adult medical examination with abnormal findings: Secondary | ICD-10-CM | POA: Diagnosis not present

## 2016-02-12 DIAGNOSIS — Z6827 Body mass index (BMI) 27.0-27.9, adult: Secondary | ICD-10-CM | POA: Diagnosis not present

## 2016-02-12 DIAGNOSIS — E663 Overweight: Secondary | ICD-10-CM | POA: Diagnosis not present

## 2016-02-12 NOTE — Telephone Encounter (Signed)
Patient notified that she has been approved to receive Eliquis free of charge from 02/11/16 through 06/21/16

## 2016-02-14 ENCOUNTER — Telehealth: Payer: Self-pay

## 2016-02-14 NOTE — Telephone Encounter (Signed)
90 day supply eliquis  5mg  arrived for pt,lot [id number], exp 08/2018

## 2016-02-20 ENCOUNTER — Other Ambulatory Visit: Payer: Self-pay

## 2016-02-20 MED ORDER — FUROSEMIDE 20 MG PO TABS: 40.0000 mg | ORAL_TABLET | Freq: Two times a day (BID) | ORAL | 6 refills | Status: DC

## 2016-02-20 NOTE — Telephone Encounter (Signed)
Confirmed with pt she takes lasix 20 mg BID not TID

## 2016-03-13 ENCOUNTER — Encounter: Payer: Self-pay | Admitting: Cardiovascular Disease

## 2016-03-13 ENCOUNTER — Ambulatory Visit (INDEPENDENT_AMBULATORY_CARE_PROVIDER_SITE_OTHER): Payer: PPO | Admitting: Cardiovascular Disease

## 2016-03-13 VITALS — BP 116/58 | HR 59 | Ht 63.0 in | Wt 153.0 lb

## 2016-03-13 DIAGNOSIS — I493 Ventricular premature depolarization: Secondary | ICD-10-CM | POA: Diagnosis not present

## 2016-03-13 DIAGNOSIS — I480 Paroxysmal atrial fibrillation: Secondary | ICD-10-CM | POA: Diagnosis not present

## 2016-03-13 DIAGNOSIS — E785 Hyperlipidemia, unspecified: Secondary | ICD-10-CM

## 2016-03-13 DIAGNOSIS — R002 Palpitations: Secondary | ICD-10-CM

## 2016-03-13 DIAGNOSIS — G4730 Sleep apnea, unspecified: Secondary | ICD-10-CM

## 2016-03-13 DIAGNOSIS — I739 Peripheral vascular disease, unspecified: Secondary | ICD-10-CM

## 2016-03-13 DIAGNOSIS — I5032 Chronic diastolic (congestive) heart failure: Secondary | ICD-10-CM

## 2016-03-13 DIAGNOSIS — Z79899 Other long term (current) drug therapy: Secondary | ICD-10-CM

## 2016-03-13 DIAGNOSIS — I10 Essential (primary) hypertension: Secondary | ICD-10-CM

## 2016-03-13 DIAGNOSIS — I252 Old myocardial infarction: Secondary | ICD-10-CM

## 2016-03-13 DIAGNOSIS — I25118 Atherosclerotic heart disease of native coronary artery with other forms of angina pectoris: Secondary | ICD-10-CM

## 2016-03-13 NOTE — Patient Instructions (Signed)
Your physician wants you to follow-up in: 6 months You will receive a reminder letter in the mail two months in advance. If you don't receive a letter, please call our office to schedule the follow-up appointment.    Get lab work today    Your physician has recommended that you have a sleep study. This test records several body functions during sleep, including: brain activity, eye movement, oxygen and carbon dioxide blood levels, heart rate and rhythm, breathing rate and rhythm, the flow of air through your mouth and nose, snoring, body muscle movements, and chest and belly movement.    Watch your caffeine intake !!        Thank you for choosing [facility] !

## 2016-03-13 NOTE — Progress Notes (Signed)
SUBJECTIVE: The patient presents for routine follow-up. She has coronary artery disease, atrial fibrillation, and chronic diastolic heart failure.  ECG performed in the office today which I personally interpreted demonstrated sinus bradycardia with a diffuse nonspecific ST segment and T-wave abnormalities.  She denies chest pain and shortness of breath. Her husband says she snores a lot and may have sleep apnea. She has had more palpitations recently sometimes awakening her at night. She does drink a lot of caffeinated beverages.  Review of Systems: As per "subjective", otherwise negative.    Current Outpatient Prescriptions  Medication Sig Dispense Refill  . amLODipine (NORVASC) 10 MG tablet TAKE ONE (1) TABLET EACH DAY 90 tablet 3  . apixaban (ELIQUIS) 5 MG TABS tablet Take 1 tablet (5 mg total) by mouth 2 (two) times daily. 56 tablet 0  . atorvastatin (LIPITOR) 80 MG tablet Take 1 tablet (80 mg total) by mouth daily at 6 PM. 30 tablet 6  . cholecalciferol (VITAMIN D) 1000 UNITS tablet Take 1,000 Units by mouth at bedtime.     . clopidogrel (PLAVIX) 75 MG tablet Take 1 tablet (75 mg total) by mouth daily. 90 tablet 3  . diltiazem (CARDIZEM) 30 MG tablet Take 1 tablet (30 mg total) by mouth 2 (two) times daily as needed (palpitations). 30 tablet 1  . furosemide (LASIX) 20 MG tablet Take 2 tablets (40 mg total) by mouth 2 (two) times daily. 60 tablet 6  . insulin NPH-regular Human (NOVOLIN 70/30) (70-30) 100 UNIT/ML injection Inject 50 Units into the skin 2 (two) times daily with a meal.     . labetalol (NORMODYNE) 200 MG tablet Take 2 tablets (400 mg total) by mouth 2 (two) times daily. 120 tablet 6  . levothyroxine (SYNTHROID, LEVOTHROID) 150 MCG tablet Take 150 mcg by mouth daily before breakfast.    . losartan (COZAAR) 100 MG tablet TAKE ONE (1) TABLET BY MOUTH EVERY DAY 90 tablet 3  . magnesium oxide (MAG-OX) 400 MG tablet Take 400 mg by mouth daily.    . metFORMIN (GLUCOPHAGE)  500 MG tablet Take 500 mg by mouth 3 (three) times daily with meals.     . nitroGLYCERIN (NITROSTAT) 0.4 MG SL tablet Place 1 tablet (0.4 mg total) under the tongue every 5 (five) minutes as needed for chest pain. 25 tablet 12  . Omega-3 Fatty Acids (FISH OIL) 1200 MG CAPS Take 1 capsule (1,200 mg total) by mouth daily.    . potassium chloride SA (KLOR-CON M20) 20 MEQ tablet Take 1 tablet (20 mEq total) by mouth daily. 90 tablet 3  . rOPINIRole (REQUIP) 0.5 MG tablet Take 0.5 mg by mouth at bedtime.   0   No current facility-administered medications for this visit.     Past Medical History:  Diagnosis Date  . Arthritis   . Atrial fibrillation and flutter ([place])    a. h/o PAF/flutter during admission in 2013 for PNA. b. PAF during adm for NSTEMI 07/2015.  [name] B12 deficiency anemia   . Coronary artery disease 11/30/2014   a. remote MI. b. h/o PTCA with scoring balloon to OM1 11/2014. c. NSTEMI 03/2015 s/p DES to prox-mid Cx. d. NSTEMI 07/2015 s/p scoring balloon/PTCA/DES to dRCA with PAF during that admission  . Cutaneous lupus erythematosus   . GERD (gastroesophageal reflux disease)   . Heart block   . History of blood transfusion 1980's   2nd surgical procedures  . HTN (hypertension)   . Hypercholesteremia   .  Hypothyroidism   . Myocardial infarction ([place]) 02/2012  . Ovarian tumor   . PAD (peripheral artery disease) ([place])    a. s/p LE angio 2015; followed by Dr. Fletcher Anon - managed medically.  . Pain with urination 05/08/2015  . Superficial fungus infection of skin 06/29/2013  . TIA (transient ischemic attack) 08/2001; ~ 2006  . Type II diabetes mellitus ([place])   . UTI (urinary tract infection) 05/08/2013    Past Surgical History:  Procedure Laterality Date  . ABDOMINAL AORTAGRAM N/A 01/03/2014   Procedure: ABDOMINAL [name];  Surgeon: Wellington Hampshire, MD;  Location: [facility] CATH LAB;  Service: Cardiovascular;  Laterality: N/A;  . ABDOMINAL HYSTERECTOMY  1972   "partial"  . APPENDECTOMY   1970's  . CARDIAC CATHETERIZATION  2008   Tiny OM-2 with 90% narrowing. Med tx.  [name] CARDIAC CATHETERIZATION N/A 11/30/2014   Procedure: Left Heart Cath and Coronary Angiography;  Surgeon: Troy Sine, MD; LAD 20%, CFX 50%, OM1 95%, right PLB 30%, LV normal   . CARDIAC CATHETERIZATION N/A 11/30/2014   Procedure: Coronary Balloon Angioplasty;  Surgeon: Troy Sine, MD;  Angiosculpt scoring balloon and PTCA to the OM1 reducing stenosis from 95% to less than 10%  . CARDIAC CATHETERIZATION N/A 04/03/2015   Procedure: Left Heart Cath and Coronary Angiography;  Surgeon: Jolaine Artist, MD; dLAD 50%, CFX 90%, OM1 100%, PLA 15%, LVEDP 13    . CARDIAC CATHETERIZATION N/A 04/03/2015   Procedure: Coronary Stent Intervention;  Surgeon: Sherren Mocha, MD; 3.0x18 mm Xience DES to the CFX    . CARDIAC CATHETERIZATION N/A 08/02/2015   Procedure: Left Heart Cath and Coronary Angiography;  Surgeon: Troy Sine, MD;  Location: [facility] CV LAB;  Service: Cardiovascular;  Laterality: N/A;  . CARDIAC CATHETERIZATION N/A 08/02/2015   Procedure: Coronary Stent Intervention;  Surgeon: Troy Sine, MD;  Location: [facility] CV LAB;  Service: Cardiovascular;  Laterality: N/A;  . cardiac stents    . CHOLECYSTECTOMY OPEN  1990's  . COLONOSCOPY  2005   Dr. Laural Golden: pancolonic divericula, polyp, path unknown currently  . COLONOSCOPY  2012   Dr. Oneida Alar: Normal TI, scattered diverticula in entire colon, small internal hemorrhoids, normal colon biopsies. Colonoscopy in 5-10 years.   . COLOSTOMY  05/1979  . COLOSTOMY REVERSAL  11/1979  . EXCISIONAL HEMORRHOIDECTOMY  1970's  . EYE SURGERY Left 2000   "branch vein occlusion"  . EYE SURGERY Left ~ 2001   "smoothed out wrinkle"  . LEFT OOPHORECTOMY  05/1979   nicked bowel, peritonitis, colostomy; colostomy reversed 1981   . LOWER EXTREMITY ANGIOGRAM N/A 01/03/2014   Procedure: LOWER EXTREMITY ANGIOGRAM;  Surgeon: Wellington Hampshire, MD;  Location: [facility] CATH LAB;   Service: Cardiovascular;  Laterality: N/A;  . Nuclear med stress test  10/2011   Small area of mild ischemia inferoapically.  [name] PARTIAL HYSTERECTOMY  1970's   left ovaries, then ovaries removed later due tumors   . RIGHT OOPHORECTOMY  1970's    Social History   Social History  . Marital status: Married    Spouse name: N/A  . Number of children: N/A  . Years of education: N/A   Occupational History  . Retired Retired    Research officer, political party   Social History Main Topics  . Smoking status: Never Smoker  . Smokeless tobacco: Never Used     Comment: Never smoked  . Alcohol use No  . Drug use: No  . Sexual activity: No  Comment: hyst   Other Topics Concern  . Not on file   Social History Narrative  . No narrative on file     Vitals:   03/13/16 1251  BP: (!) 116/58  Pulse: (!) 59  SpO2: 98%  Weight: 153 lb (69.4 kg)  Height: 5\' 3"  (1.6 m)    PHYSICAL EXAM General: NAD HEENT: Normal. Neck: No JVD, no thyromegaly. Lungs: Clear to auscultation bilaterally with normal respiratory effort. CV: Nondisplaced PMI.  Regular rate and rhythm, normal S1/S2, no S3/S4, no murmur. No pretibial or periankle edema.  No carotid bruit.   Abdomen: Soft, nontender, no distention.  Neurologic: Alert and oriented.  Psych: Normal affect. Skin: Normal. Musculoskeletal: No gross deformities.    ECG: Most recent ECG reviewed.      ASSESSMENT AND PLAN: 1. CAD s/p PCI of LCx: Symptomatically stable. Continue Lipitor, Plavix, losartan, and labetalol.   2. Essential HTN: Controlled on present therapy. Continue labetalol, losartan, and amlodipine.  3. Hyperlipidemia: Lipids from 10/02/15 previously reviewed. Continue Lipitor 20 mg daily and fish oil for elevated triglycerides.  4. PVD: Previously described claudication pain and I encouraged to make appt soon with vascular surgery.  5. Chronic diastolic heart failure: Euvolemic. No changes.  6. Atrial fibrillation: Currently in  sinus rhythm. Continue Eliquis and labetalol and diltiazem as needed.  7. Palpitations: Will check K and Mg. Instructed to decrease caffeinated beverage consumption.  8. Snoring/sleep apnea: Will obtain sleep study.  Dispo: fu 6 months.   Kate Sable, M.D., F.A.C.C.

## 2016-03-14 LAB — POTASSIUM: Potassium: 4.7 mmol/L (ref 3.5–5.3)

## 2016-03-14 LAB — MAGNESIUM: Magnesium: 1.9 mg/dL (ref 1.5–2.5)

## 2016-03-17 ENCOUNTER — Telehealth: Payer: Self-pay | Admitting: *Deleted

## 2016-03-17 NOTE — Telephone Encounter (Signed)
-----   Message from Laurine Blazer, LPN sent at 07/21/2907  9:19 AM EDT ----- Virginia Huber patient.  ----- Message ----- From: Herminio Commons, MD Sent: 03/16/2016   8:59 AM To: Laurine Blazer, LPN  ok

## 2016-03-17 NOTE — Telephone Encounter (Signed)
Called patient with test results. No answer. Left message to call back.  

## 2016-04-01 ENCOUNTER — Encounter: Payer: Self-pay | Admitting: Gastroenterology

## 2016-04-01 ENCOUNTER — Ambulatory Visit (INDEPENDENT_AMBULATORY_CARE_PROVIDER_SITE_OTHER): Payer: PPO | Admitting: Gastroenterology

## 2016-04-01 VITALS — BP 139/64 | HR 64 | Temp 97.8°F | Ht 63.0 in | Wt 153.4 lb

## 2016-04-01 DIAGNOSIS — K529 Noninfective gastroenteritis and colitis, unspecified: Secondary | ICD-10-CM

## 2016-04-01 NOTE — Assessment & Plan Note (Signed)
Chronic diarrhea without alarm symptoms. Last colonoscopy 2012 with normal TI and normal colonic biopsies, heme negative Jan 2017. Recent numerous cardiac events earlier this year. Appears to be metformin-related. Will be seeing Dr. Dorris Fetch soon. I have asked her to discuss this with him, as there are multiple different agents for diabetes management that may be better for her. Hopefully, she can find a better option for her. If not, she has a prescription for Colestid that we will trial. Follow-up in 3 months.

## 2016-04-01 NOTE — Patient Instructions (Signed)
Let me know how the appointment with Dr. Dorris Fetch goes.   We will see you in 3 months!

## 2016-04-01 NOTE — Progress Notes (Signed)
cc'ed to pcp °

## 2016-04-01 NOTE — Progress Notes (Signed)
Referring Provider: Sharilyn Sites, MD Primary Care Physician:  Purvis Kilts, MD  Primary GI: Dr. Oneida Alar   Chief Complaint  Patient presents with  . Follow-up    still having diarrhea    HPI:   Virginia Huber is a 70 y.o. female presenting today with a remote history of colon polyp in 2005, last colonoscopy by Dr. Oneida Alar in 2012 with normal TI and normal colonic biospies, recommended in 5-10 years. Has a history of chronic diarrhea. States it is not every day but sometimes has no control over it. Sometimes happens at night. No rectal bleeding. Multiple cardiac issues over the past 12 months. History of remote MI, then NSTEMI Oct 2016, NSTEMI Feb 2017, stent placements, hospitalizations for afib. Heme negative Jan 2017. Given Zenpep 40,000 units (take 2 with meals) samples to try at last visit. Next step would be Colestid. Zenpep only helped for a few days. Has not tried Colestid yet, but she did pick up prescription.   Went to extended release metformin for a few days and diarrhea was much better. Sugar went sky high however. Had to go back to regular metformin. Has appt with Dr. Dorris Fetch upcoming for diabetes management. Would like to see what he says first before trying anything different.   Past Medical History:  Diagnosis Date  . Arthritis   . Atrial fibrillation and flutter ([place])    a. h/o PAF/flutter during admission in 2013 for PNA. b. PAF during adm for NSTEMI 07/2015.  [name] B12 deficiency anemia   . Coronary artery disease 11/30/2014   a. remote MI. b. h/o PTCA with scoring balloon to OM1 11/2014. c. NSTEMI 03/2015 s/p DES to prox-mid Cx. d. NSTEMI 07/2015 s/p scoring balloon/PTCA/DES to dRCA with PAF during that admission  . Cutaneous lupus erythematosus   . GERD (gastroesophageal reflux disease)   . Heart block   . History of blood transfusion 1980's   2nd surgical procedures  . HTN (hypertension)   . Hypercholesteremia   . Hypothyroidism   . Myocardial infarction 02/2012    . Ovarian tumor   . PAD (peripheral artery disease) ([place])    a. s/p LE angio 2015; followed by Dr. Fletcher Anon - managed medically.  . Pain with urination 05/08/2015  . Superficial fungus infection of skin 06/29/2013  . TIA (transient ischemic attack) 08/2001; ~ 2006  . Type II diabetes mellitus ([place])   . UTI (urinary tract infection) 05/08/2013    Past Surgical History:  Procedure Laterality Date  . ABDOMINAL AORTAGRAM N/A 01/03/2014   Procedure: ABDOMINAL [name];  Surgeon: Wellington Hampshire, MD;  Location: [facility];  Service: Cardiovascular;  Laterality: N/A;  . ABDOMINAL HYSTERECTOMY  1972   "partial"  . APPENDECTOMY  1970's  . CARDIAC CATHETERIZATION  2008   Tiny OM-2 with 90% narrowing. Med tx.  [name] CARDIAC CATHETERIZATION N/A 11/30/2014   Procedure: Left Heart Cath and Coronary Angiography;  Surgeon: Troy Sine, MD; LAD 20%, CFX 50%, OM1 95%, right PLB 30%, LV normal   . CARDIAC CATHETERIZATION N/A 11/30/2014   Procedure: Coronary Balloon Angioplasty;  Surgeon: Troy Sine, MD;  Angiosculpt scoring balloon and PTCA to the OM1 reducing stenosis from 95% to less than 10%  . CARDIAC CATHETERIZATION N/A 04/03/2015   Procedure: Left Heart Cath and Coronary Angiography;  Surgeon: Jolaine Artist, MD; dLAD 50%, CFX 90%, OM1 100%, PLA 15%, LVEDP 13    . CARDIAC CATHETERIZATION N/A 04/03/2015   Procedure: Coronary Stent Intervention;  Surgeon: Sherren Mocha, MD; 3.0x18 mm Xience DES to the CFX    . CARDIAC CATHETERIZATION N/A 08/02/2015   Procedure: Left Heart Cath and Coronary Angiography;  Surgeon: Troy Sine, MD;  Location: [facility];  Service: Cardiovascular;  Laterality: N/A;  . CARDIAC CATHETERIZATION N/A 08/02/2015   Procedure: Coronary Stent Intervention;  Surgeon: Troy Sine, MD;  Location: [facility];  Service: Cardiovascular;  Laterality: N/A;  . cardiac stents    . CHOLECYSTECTOMY OPEN  1990's  . COLONOSCOPY  2005   Dr. Laural Golden: pancolonic  divericula, polyp, path unknown currently  . COLONOSCOPY  2012   Dr. Oneida Alar: Normal TI, scattered diverticula in entire colon, small internal hemorrhoids, normal colon biopsies. Colonoscopy in 5-10 years.   . COLOSTOMY  05/1979  . COLOSTOMY REVERSAL  11/1979  . EXCISIONAL HEMORRHOIDECTOMY  1970's  . EYE SURGERY Left 2000   "branch vein occlusion"  . EYE SURGERY Left ~ 2001   "smoothed out wrinkle"  . LEFT OOPHORECTOMY  05/1979   nicked bowel, peritonitis, colostomy; colostomy reversed 1981   . LOWER EXTREMITY ANGIOGRAM N/A 01/03/2014   Procedure: LOWER EXTREMITY ANGIOGRAM;  Surgeon: Wellington Hampshire, MD;  Location: [facility];  Service: Cardiovascular;  Laterality: N/A;  . Nuclear med stress test  10/2011   Small area of mild ischemia inferoapically.  [name] PARTIAL HYSTERECTOMY  1970's   left ovaries, then ovaries removed later due tumors   . RIGHT OOPHORECTOMY  1970's    Current Outpatient Prescriptions  Medication Sig Dispense Refill  . amLODipine (NORVASC) 10 MG tablet TAKE ONE (1) TABLET EACH DAY 90 tablet 3  . apixaban (ELIQUIS) 5 MG TABS tablet Take 1 tablet (5 mg total) by mouth 2 (two) times daily. 56 tablet 0  . atorvastatin (LIPITOR) 80 MG tablet Take 1 tablet (80 mg total) by mouth daily at 6 PM. 30 tablet 6  . cholecalciferol (VITAMIN D) 1000 UNITS tablet Take 1,000 Units by mouth at bedtime.     . clopidogrel (PLAVIX) 75 MG tablet Take 1 tablet (75 mg total) by mouth daily. 90 tablet 3  . furosemide (LASIX) 20 MG tablet Take 2 tablets (40 mg total) by mouth 2 (two) times daily. 60 tablet 6  . insulin NPH-regular Human (NOVOLIN 70/30) (70-30) 100 UNIT/ML injection Inject 60 Units into the skin 2 (two) times daily with a meal.     . labetalol (NORMODYNE) 200 MG tablet Take 2 tablets (400 mg total) by mouth 2 (two) times daily. 120 tablet 6  . levothyroxine (SYNTHROID, LEVOTHROID) 150 MCG tablet Take 150 mcg by mouth daily before breakfast.    . losartan (COZAAR) 100 MG tablet  TAKE ONE (1) TABLET BY MOUTH EVERY DAY 90 tablet 3  . magnesium oxide (MAG-OX) 400 MG tablet Take 400 mg by mouth daily.    . metFORMIN (GLUCOPHAGE) 500 MG tablet Take 500 mg by mouth 3 (three) times daily with meals.     . nitroGLYCERIN (NITROSTAT) 0.4 MG SL tablet Place 1 tablet (0.4 mg total) under the tongue every 5 (five) minutes as needed for chest pain. 25 tablet 12  . Omega-3 Fatty Acids (FISH OIL) 1200 MG CAPS Take 1 capsule (1,200 mg total) by mouth daily.    . potassium chloride SA (KLOR-CON M20) 20 MEQ tablet Take 1 tablet (20 mEq total) by mouth daily. 90 tablet 3  . rOPINIRole (REQUIP) 0.5 MG tablet Take 0.5 mg by mouth at bedtime.   0  No current facility-administered medications for this visit.     Allergies as of 04/01/2016 - Review Complete 04/01/2016  Allergen Reaction Noted  . Penicillins Hives   . Percocet [oxycodone-acetaminophen] Nausea And Vomiting 02/27/2012    Family History  Problem Relation Age of Onset  . Heart disease Mother     deceased  . Heart disease Father     deceased, heart disease  . Diabetes Brother   . Heart disease Brother   . Thyroid disease Brother   . Heart disease Sister   . Heart disease Brother   . Thyroid disease Brother   . Lupus Daughter   . Colon cancer Neg Hx     Social History   Social History  . Marital status: Married    Spouse name: N/A  . Number of children: N/A  . Years of education: N/A   Occupational History  . Retired Retired    Research officer, political party   Social History Main Topics  . Smoking status: Never Smoker  . Smokeless tobacco: Never Used     Comment: Never smoked  . Alcohol use No  . Drug use: No  . Sexual activity: No     Comment: hyst   Other Topics Concern  . None   Social History Narrative  . None    Review of Systems: As mentioned in HPI   Physical Exam: BP 139/64   Pulse 64   Temp 97.8 F (36.6 C) (Oral)   Ht 5\' 3"  (1.6 m)   Wt 153 lb 6.4 oz (69.6 kg)   BMI 27.17 kg/m    General:   Alert and oriented. No distress noted. Pleasant and cooperative.  Head:  Normocephalic and atraumatic. Eyes:  Conjuctiva clear without scleral icterus. Mouth:  Oral mucosa pink and moist. Good dentition. No lesions. Abdomen:  +BS, soft, non-tender and non-distended. No rebound or guarding. No HSM or masses noted. Msk:  Symmetrical without gross deformities. Normal posture. Extremities:  Without edema. Neurologic:  Alert and  oriented x4;  grossly normal neurologically. Psych:  Alert and cooperative. Normal mood and affect.

## 2016-04-12 ENCOUNTER — Ambulatory Visit: Payer: PPO | Attending: Cardiovascular Disease | Admitting: Neurology

## 2016-04-12 DIAGNOSIS — Z79899 Other long term (current) drug therapy: Secondary | ICD-10-CM | POA: Insufficient documentation

## 2016-04-12 DIAGNOSIS — G4730 Sleep apnea, unspecified: Secondary | ICD-10-CM

## 2016-04-12 DIAGNOSIS — G4733 Obstructive sleep apnea (adult) (pediatric): Secondary | ICD-10-CM | POA: Diagnosis not present

## 2016-04-12 DIAGNOSIS — Z7901 Long term (current) use of anticoagulants: Secondary | ICD-10-CM | POA: Insufficient documentation

## 2016-04-12 DIAGNOSIS — R0683 Snoring: Secondary | ICD-10-CM | POA: Diagnosis not present

## 2016-04-12 DIAGNOSIS — Z794 Long term (current) use of insulin: Secondary | ICD-10-CM | POA: Diagnosis not present

## 2016-04-14 ENCOUNTER — Encounter: Payer: Self-pay | Admitting: "Endocrinology

## 2016-04-14 ENCOUNTER — Ambulatory Visit (INDEPENDENT_AMBULATORY_CARE_PROVIDER_SITE_OTHER): Payer: PPO | Admitting: "Endocrinology

## 2016-04-14 VITALS — BP 141/79 | HR 67 | Ht 63.0 in | Wt 154.0 lb

## 2016-04-14 DIAGNOSIS — E038 Other specified hypothyroidism: Secondary | ICD-10-CM

## 2016-04-14 DIAGNOSIS — E1159 Type 2 diabetes mellitus with other circulatory complications: Secondary | ICD-10-CM | POA: Diagnosis not present

## 2016-04-14 DIAGNOSIS — E782 Mixed hyperlipidemia: Secondary | ICD-10-CM

## 2016-04-14 DIAGNOSIS — Z23 Encounter for immunization: Secondary | ICD-10-CM | POA: Diagnosis not present

## 2016-04-14 DIAGNOSIS — I10 Essential (primary) hypertension: Secondary | ICD-10-CM | POA: Diagnosis not present

## 2016-04-14 LAB — LIPID PANEL
CHOL/HDL RATIO: 4.8 ratio (ref <=5.0)
Cholesterol: 116 mg/dL — ABNORMAL LOW (ref 125–200)
HDL: 24 mg/dL — AB (ref >=46)
Triglycerides: 439 mg/dL — ABNORMAL HIGH (ref <150)

## 2016-04-14 LAB — COMPREHENSIVE METABOLIC PANEL
ALBUMIN: 4.1 g/dL (ref 3.6–5.1)
ALT: 13 U/L (ref 6–29)
AST: 9 U/L — AB (ref 10–35)
Alkaline Phosphatase: 80 U/L (ref 33–130)
BILIRUBIN TOTAL: 0.5 mg/dL (ref 0.2–1.2)
BUN: 13 mg/dL (ref 7–25)
CHLORIDE: 105 mmol/L (ref 98–110)
CO2: 24 mmol/L (ref 20–31)
CREATININE: 0.88 mg/dL (ref 0.60–0.93)
Calcium: 9.2 mg/dL (ref 8.6–10.4)
Glucose, Bld: 254 mg/dL — ABNORMAL HIGH (ref 65–99)
Potassium: 4.4 mmol/L (ref 3.5–5.3)
SODIUM: 139 mmol/L (ref 135–146)
Total Protein: 6 g/dL — ABNORMAL LOW (ref 6.1–8.1)

## 2016-04-14 LAB — TSH: TSH: 2.36 m[IU]/L

## 2016-04-14 LAB — T4, FREE: Free T4: 1.4 ng/dL (ref 0.8–1.8)

## 2016-04-14 NOTE — Patient Instructions (Signed)

## 2016-04-14 NOTE — Progress Notes (Signed)
Subjective:    Patient ID: Virginia Huber, female    DOB: 10-Jun-1946. Patient is being seen in consultation for management of diabetes requested by  Purvis Kilts, MD  Past Medical History:  Diagnosis Date  . Arthritis   . Atrial fibrillation and flutter ([place])    a. h/o PAF/flutter during admission in 2013 for PNA. b. PAF during adm for NSTEMI 07/2015.  [name] B12 deficiency anemia   . Coronary artery disease 11/30/2014   a. remote MI. b. h/o PTCA with scoring balloon to OM1 11/2014. c. NSTEMI 03/2015 s/p DES to prox-mid Cx. d. NSTEMI 07/2015 s/p scoring balloon/PTCA/DES to dRCA with PAF during that admission  . Cutaneous lupus erythematosus   . GERD (gastroesophageal reflux disease)   . Heart block   . History of blood transfusion 1980's   2nd surgical procedures  . HTN (hypertension)   . Hypercholesteremia   . Hypothyroidism   . Myocardial infarction 02/2012  . Ovarian tumor   . PAD (peripheral artery disease) ([place])    a. s/p LE angio 2015; followed by Dr. Fletcher Anon - managed medically.  . Pain with urination 05/08/2015  . Superficial fungus infection of skin 06/29/2013  . TIA (transient ischemic attack) 08/2001; ~ 2006  . Type II diabetes mellitus ([place])   . UTI (urinary tract infection) 05/08/2013   Past Surgical History:  Procedure Laterality Date  . ABDOMINAL AORTAGRAM N/A 01/03/2014   Procedure: ABDOMINAL [name];  Surgeon: Wellington Hampshire, MD;  Location: [facility];  Service: Cardiovascular;  Laterality: N/A;  . ABDOMINAL HYSTERECTOMY  1972   "partial"  . APPENDECTOMY  1970's  . CARDIAC CATHETERIZATION  2008   Tiny OM-2 with 90% narrowing. Med tx.  [name] CARDIAC CATHETERIZATION N/A 11/30/2014   Procedure: Left Heart Cath and Coronary Angiography;  Surgeon: Troy Sine, MD; LAD 20%, CFX 50%, OM1 95%, right PLB 30%, LV normal   . CARDIAC CATHETERIZATION N/A 11/30/2014   Procedure: Coronary Balloon Angioplasty;  Surgeon: Troy Sine, MD;  Angiosculpt scoring balloon and  PTCA to the OM1 reducing stenosis from 95% to less than 10%  . CARDIAC CATHETERIZATION N/A 04/03/2015   Procedure: Left Heart Cath and Coronary Angiography;  Surgeon: Jolaine Artist, MD; dLAD 50%, CFX 90%, OM1 100%, PLA 15%, LVEDP 13    . CARDIAC CATHETERIZATION N/A 04/03/2015   Procedure: Coronary Stent Intervention;  Surgeon: Sherren Mocha, MD; 3.0x18 mm Xience DES to the CFX    . CARDIAC CATHETERIZATION N/A 08/02/2015   Procedure: Left Heart Cath and Coronary Angiography;  Surgeon: Troy Sine, MD;  Location: [facility];  Service: Cardiovascular;  Laterality: N/A;  . CARDIAC CATHETERIZATION N/A 08/02/2015   Procedure: Coronary Stent Intervention;  Surgeon: Troy Sine, MD;  Location: [facility];  Service: Cardiovascular;  Laterality: N/A;  . cardiac stents    . CHOLECYSTECTOMY OPEN  1990's  . COLONOSCOPY  2005   Dr. Laural Golden: pancolonic divericula, polyp, path unknown currently  . COLONOSCOPY  2012   Dr. Oneida Alar: Normal TI, scattered diverticula in entire colon, small internal hemorrhoids, normal colon biopsies. Colonoscopy in 5-10 years.   . COLOSTOMY  05/1979  . COLOSTOMY REVERSAL  11/1979  . EXCISIONAL HEMORRHOIDECTOMY  1970's  . EYE SURGERY Left 2000   "branch vein occlusion"  . EYE SURGERY Left ~ 2001   "smoothed out wrinkle"  . LEFT OOPHORECTOMY  05/1979   nicked bowel, peritonitis, colostomy; colostomy reversed 1981   .  LOWER EXTREMITY ANGIOGRAM N/A 01/03/2014   Procedure: LOWER EXTREMITY ANGIOGRAM;  Surgeon: Wellington Hampshire, MD;  Location: [facility];  Service: Cardiovascular;  Laterality: N/A;  . Nuclear med stress test  10/2011   Small area of mild ischemia inferoapically.  [name] PARTIAL HYSTERECTOMY  1970's   left ovaries, then ovaries removed later due tumors   . RIGHT OOPHORECTOMY  1970's   Social History   Social History  . Marital status: Married    Spouse name: N/A  . Number of children: N/A  . Years of education: N/A   Occupational History   . Retired Retired    Research officer, political party   Social History Main Topics  . Smoking status: Never Smoker  . Smokeless tobacco: Never Used     Comment: Never smoked  . Alcohol use No  . Drug use: No  . Sexual activity: No     Comment: hyst   Other Topics Concern  . None   Social History Narrative  . None   Outpatient Encounter Prescriptions as of 04/14/2016  Medication Sig  . amLODipine (NORVASC) 10 MG tablet TAKE ONE (1) TABLET EACH DAY  . apixaban (ELIQUIS) 5 MG TABS tablet Take 1 tablet (5 mg total) by mouth 2 (two) times daily.  [name] atorvastatin (LIPITOR) 80 MG tablet Take 1 tablet (80 mg total) by mouth daily at 6 PM.  . cholecalciferol (VITAMIN D) 1000 UNITS tablet Take 1,000 Units by mouth at bedtime.   . clopidogrel (PLAVIX) 75 MG tablet Take 1 tablet (75 mg total) by mouth daily.  . furosemide (LASIX) 20 MG tablet Take 2 tablets (40 mg total) by mouth 2 (two) times daily.  . insulin NPH-regular Human (NOVOLIN 70/30) (70-30) 100 UNIT/ML injection Inject 40 Units into the skin 2 (two) times daily with a meal.  . labetalol (NORMODYNE) 200 MG tablet Take 2 tablets (400 mg total) by mouth 2 (two) times daily.  [name] levothyroxine (SYNTHROID, LEVOTHROID) 150 MCG tablet Take 150 mcg by mouth daily before breakfast.  . losartan (COZAAR) 100 MG tablet TAKE ONE (1) TABLET BY MOUTH EVERY DAY  . magnesium oxide (MAG-OX) 400 MG tablet Take 400 mg by mouth daily.  . metFORMIN (GLUCOPHAGE) 500 MG tablet Take 500 mg by mouth 2 (two) times daily with a meal.  . nitroGLYCERIN (NITROSTAT) 0.4 MG SL tablet Place 1 tablet (0.4 mg total) under the tongue every 5 (five) minutes as needed for chest pain.  . Omega-3 Fatty Acids (FISH OIL) 1200 MG CAPS Take 1 capsule (1,200 mg total) by mouth daily.  . potassium chloride SA (KLOR-CON M20) 20 MEQ tablet Take 1 tablet (20 mEq total) by mouth daily.  [name] rOPINIRole (REQUIP) 0.5 MG tablet Take 0.5 mg by mouth at bedtime.    No facility-administered encounter  medications on file as of 04/14/2016.    ALLERGIES: VACCINATION STATUS: Immunization History  Administered Date(s) Administered  . Influenza-Unspecified 03/27/2015  . Pneumococcal-Unspecified 02/21/2012    Diabetes  She presents for her initial diabetic visit. She has type 2 diabetes mellitus. Onset time: She was diagnosed at approximate age of 41 years. Her disease course has been stable. There are no hypoglycemic associated symptoms. Pertinent negatives for hypoglycemia include no confusion, headaches, pallor, seizures or tremors. Associated symptoms include fatigue, polydipsia and polyuria. Pertinent negatives for diabetes include no chest pain and no polyphagia. There are no hypoglycemic complications. Symptoms are stable. Diabetic complications include a CVA and heart disease. Risk factors for coronary artery disease include  diabetes mellitus, dyslipidemia, hypertension and sedentary lifestyle. Current diabetic treatments: She is on Novolin 70/30 60 units twice a day, metformin 500 mg 3 times a day. Her weight is stable. She is following a generally unhealthy diet. When asked about meal planning, she reported none. She has not had a previous visit with a dietitian. She participates in exercise intermittently. Home blood sugar record trend: She did not bring the meter to review today. She mentions that she is monitoring randomly and regular.. Her overall blood glucose range is 180-200 mg/dl. An ACE inhibitor/angiotensin II receptor blocker is being taken. Eye exam is current.  Hyperlipidemia  This is a chronic problem. The current episode started more than 1 year ago. The problem is uncontrolled. Recent lipid tests were reviewed and are high. Exacerbating diseases include diabetes and hypothyroidism. Pertinent negatives include no chest pain, myalgias or shortness of breath. Current antihyperlipidemic treatment includes statins, fibric acid derivatives and bile acid squestrants. The current  treatment provides moderate improvement of lipids.  Hypertension  This is a chronic problem. The current episode started more than 1 year ago. The problem is uncontrolled. Pertinent negatives include no chest pain, headaches, palpitations or shortness of breath. Risk factors for coronary artery disease include dyslipidemia, diabetes mellitus and sedentary lifestyle. Past treatments include angiotensin blockers. Hypertensive end-organ damage includes CAD/MI and CVA.     Review of Systems  Constitutional: Positive for fatigue. Negative for chills, fever and unexpected weight change.  HENT: Negative for trouble swallowing and voice change.   Eyes: Negative for visual disturbance.  Respiratory: Negative for cough, shortness of breath and wheezing.   Cardiovascular: Negative for chest pain, palpitations and leg swelling.       No Shortness of breath  Gastrointestinal: Negative for abdominal pain, diarrhea, nausea and vomiting.  Endocrine: Positive for polydipsia and polyuria. Negative for cold intolerance, heat intolerance and polyphagia.  Genitourinary: Negative for frequency, hematuria and urgency.  Musculoskeletal: Negative for arthralgias and myalgias.  Skin: Negative for color change, pallor, rash and wound.  Neurological: Negative for tremors, seizures and headaches.  Hematological: Does not bruise/bleed easily.  Psychiatric/Behavioral: Negative for confusion, hallucinations and suicidal ideas.    Objective:    BP (!) 141/79   Pulse 67   Ht 5\' 3"  (1.6 m)   Wt 154 lb (69.9 kg)   BMI 27.28 kg/m   Wt Readings from Last 3 Encounters:  04/14/16 154 lb (69.9 kg)  04/01/16 153 lb 6.4 oz (69.6 kg)  03/13/16 153 lb (69.4 kg)    Physical Exam  Constitutional: She is oriented to person, place, and time. She appears well-developed.  HENT:  Head: Normocephalic and atraumatic.  Eyes: EOM are normal.  Neck: Normal range of motion. Neck supple. No tracheal deviation present. No thyromegaly  present.  Cardiovascular: Normal rate and regular rhythm.   Pulmonary/Chest: Effort normal and breath sounds normal.  Abdominal: Soft. Bowel sounds are normal. There is no tenderness. There is no guarding.  Musculoskeletal: Normal range of motion. She exhibits no edema.  Neurological: She is alert and oriented to person, place, and time. She has normal reflexes. No cranial nerve deficit. Coordination normal.  Skin: Skin is warm and dry. No rash noted. No erythema. No pallor.  Psychiatric: She has a normal mood and affect. Judgment normal.    CMP     Component Value Date/Time   NA 142 10/29/2015 0508   K 4.7 03/13/2016 1356   CL 112 (H) 10/29/2015 0508   CO2 24 10/29/2015  3419   GLUCOSE 140 (H) 10/29/2015 0508   BUN 16 10/29/2015 0508   CREATININE 0.85 10/29/2015 0508   CREATININE 0.96 (H) 10/14/2015 0931   CALCIUM 8.6 (L) 10/29/2015 0508   PROT 5.3 (L) 10/02/2015 0917   ALBUMIN 3.4 (L) 10/02/2015 0917   AST 7 (L) 10/02/2015 0917   ALT 9 10/02/2015 0917   ALKPHOS 53 10/02/2015 0917   BILITOT 1.1 10/02/2015 0917   GFRNONAA >60 10/29/2015 0508   GFRAA >60 10/29/2015 0508     Diabetic Labs (most recent): Lab Results  Component Value Date   HGBA1C 7.5 (H) 10/28/2015   HGBA1C 7.5 (H) 09/17/2015   HGBA1C 8.7 (H) 08/02/2015     Lipid Panel ( most recent) Lipid Panel     Component Value Date/Time   CHOL 97 (L) 10/02/2015 0850   TRIG 117 10/02/2015 0850   HDL 36 (L) 10/02/2015 0850   CHOLHDL 2.7 10/02/2015 0850   VLDL 23 10/02/2015 0850   LDLCALC 38 10/02/2015 0850       Assessment & Plan:   1. DM type 2 causing vascular disease ([place]) - Patient has currently uncontrolled symptomatic type 2 DM since  70 years of age,  with most recent A1c of 7.5 %.  - She has no recent labs to review, I will send her to get fresh set of labs.  -Her diabetes is complicated by coronary artery disease which is recurrent requiring stent placement , CVA, and patient remains at a high risk  for more acute and chronic complications of diabetes which include CAD, CVA, CKD, retinopathy, and neuropathy. These are all discussed in detail with the patient.  - I have counseled the patient on diet management and weight loss, by adopting a carbohydrate restricted/protein rich diet.  - Suggestion is made for patient to avoid simple carbohydrates   from their diet including Cakes , Desserts, Ice Cream,  Soda (  diet and regular) , Sweet Tea , Candies,  Chips, Cookies, Artificial Sweeteners,   and "Sugar-free" Products . This will help patient to have stable blood glucose profile and potentially avoid unintended weight gain.  - I encouraged the patient to switch to  unprocessed or minimally processed complex starch and increased protein intake (animal or plant source), fruits, and vegetables.  - Patient is advised to stick to a routine mealtimes to eat 3 meals  a day and avoid unnecessary snacks ( to snack only to correct hypoglycemia).  - The patient will be scheduled with Jearld Fenton, RDN, CDE for individualized DM education.  - I have approached patient with the following individualized plan to manage diabetes and patient agrees:   - Due to cost reasons she is on Novolin 70/30 and she has some supplies left. - I will allow her to use up her available supplies of Novolin 70/30 using 40 units with breakfast and 40 units with supper when pre-meal blood glucose is above 90 mg/dL. - She is advised to start strict monitoring of glucose  AC and HS. - If her blood glucose profile is significantly abnormal, she will be considered for insulin analogs. - Patient is warned not to take insulin without proper monitoring per orders.  -Patient is encouraged to call clinic for blood glucose levels less than 70 or above 300 mg /dl. - I will lower metformin to 500 mg by mouth twice a day due to some GI complaints ,  Still therapeutically suitable for patient.   - Patient specific target  A1c;  LDL, HDL,  Triglycerides, and  Waist Circumference were discussed in detail.  2) BP/HTN: Uncontrolled. Continue current medications including ACEI/ARB. 3) Lipids/HPL:  In throat with LDL at 38, continue statins. 4)  Weight/Diet: CDE Consult will be initiated , exercise, and detailed carbohydrates information provided.   5) hypothyroidism: Long-standing for at least 20 years. She is on levothyroxine 150 g by mouth every morning. She does not have recent thyroid function test to review. I advised her to continue on the same dose for now.  - We discussed about correct intake of levothyroxine, at fasting, with water, separated by at least 30 minutes from breakfast, and separated by more than 4 hours from calcium, iron, multivitamins, acid reflux medications (PPIs). -Patient is made aware of the fact that thyroid hormone replacement is needed for life, dose to be adjusted by periodic monitoring of thyroid function tests.  6) Chronic Care/Health Maintenance:  -Patient is on ACEI/ARB and Statin medications and encouraged to continue to follow up with Ophthalmology, Podiatrist at least yearly or according to recommendations, and advised to  stay away from smoking. I have recommended yearly flu vaccine and pneumonia vaccination at least every 5 years; moderate intensity exercise for up to 150 minutes weekly; and  sleep for at least 7 hours a day.  - 60 minutes of time was spent on the care of this patient , 50% of which was applied for counseling on diabetes complications and their preventions.  - Patient to bring meter and  blood glucose logs during their next visit.   - I advised patient to maintain close follow up with Purvis Kilts, MD for primary care needs.  Follow up plan: - Return in about 1 week (around 04/21/2016) for follow up with pre-visit labs, meter, and logs, labs today.  Glade Lloyd, MD Phone: [phone]  Fax: [phone]   04/14/2016, 9:34 AM

## 2016-04-15 LAB — MICROALBUMIN / CREATININE URINE RATIO
CREATININE, URINE: 80 mg/dL (ref 20–320)
MICROALB UR: 35 mg/dL
Microalb Creat Ratio: 438 mcg/mg creat — ABNORMAL HIGH (ref <30)

## 2016-04-15 LAB — HEMOGLOBIN A1C
Hgb A1c MFr Bld: 7.7 % — ABNORMAL HIGH (ref <5.7)
MEAN PLASMA GLUCOSE: 174 mg/dL

## 2016-04-16 ENCOUNTER — Encounter: Payer: PPO | Attending: "Endocrinology | Admitting: Nutrition

## 2016-04-16 ENCOUNTER — Encounter: Payer: Self-pay | Admitting: Nutrition

## 2016-04-16 VITALS — Ht 63.0 in | Wt 154.0 lb

## 2016-04-16 DIAGNOSIS — Z713 Dietary counseling and surveillance: Secondary | ICD-10-CM | POA: Diagnosis not present

## 2016-04-16 DIAGNOSIS — E1159 Type 2 diabetes mellitus with other circulatory complications: Secondary | ICD-10-CM | POA: Diagnosis not present

## 2016-04-16 DIAGNOSIS — Z794 Long term (current) use of insulin: Secondary | ICD-10-CM

## 2016-04-16 DIAGNOSIS — IMO0002 Reserved for concepts with insufficient information to code with codable children: Secondary | ICD-10-CM

## 2016-04-16 DIAGNOSIS — E1165 Type 2 diabetes mellitus with hyperglycemia: Secondary | ICD-10-CM

## 2016-04-16 DIAGNOSIS — E118 Type 2 diabetes mellitus with unspecified complications: Secondary | ICD-10-CM

## 2016-04-16 NOTE — Patient Instructions (Signed)
Goals  1. Follow My Plate 2. Increase protein with breakfast 3. Eat 2-3 carb choices per meal 4. Increase exercise 30 minutes 4 days per week 5. Test blood sugars as instructed 6. Keep food journal and bring at next visit Bring meter and log sheet to all visits Get A1C to 7% or lower  Take Meds as prescribed.

## 2016-04-16 NOTE — Progress Notes (Signed)
Diabetes Self-Management Education  Visit Type: First/Initial  Appt. Start Time: 800 Appt. End Time: 900  04/16/2016  Virginia Huber, identified by name and date of birth, is a 70 y.o. female with a diagnosis of Diabetes: Type 2.  Assessment:  Primary concerns today: Diabetes Type 2. LIves with her husband. She does the cooking and shopping in the home. Eats three meal per day. Physical activity: 64minutes a day of walking or riding exercise bike.  Testing blood sugars 4 times per day.  BS 150-300's. 40 units of 70/30 BID and Metformin 500 mg BID. Lab Results  Component Value Date   HGBA1C 7.7 (H) 04/14/2016    ASSESSMENT  Height 5\' 3"  (1.6 m), weight 154 lb (69.9 kg). Body mass index is 27.28 kg/m.      Diabetes Self-Management Education - 04/16/16 1829      Visit Information   Visit Type First/Initial     Initial Visit   Diabetes Type Type 2   Are you currently following a meal plan? Yes   What type of meal plan do you follow? diabetic   Are you taking your medications as prescribed? Yes   Date Diagnosed 1990     Health Coping   How would you rate your overall health? Fair     Psychosocial Assessment   Patient Belief/Attitude about Diabetes Motivated to manage diabetes   Self-care barriers None   Self-management support Friends;Family;Doctor's office   Other persons present Patient   Patient Concerns Nutrition/Meal planning;Medication;Monitoring;Healthy Lifestyle;Problem Solving;Glycemic Control;Weight Control   Special Needs None   Preferred Learning Style Visual   Learning Readiness Change in progress   How often do you need to have someone help you when you read instructions, pamphlets, or other written materials from your doctor or pharmacy? 1 - Never   What is the last grade level you completed in school? 12     Pre-Education Assessment   Patient understands the diabetes disease and treatment process. Needs Review   Patient understands incorporating  nutritional management into lifestyle. Needs Review   Patient undertands incorporating physical activity into lifestyle. Needs Review   Patient understands using medications safely. Needs Review   Patient understands monitoring blood glucose, interpreting and using results Needs Review   Patient understands prevention, detection, and treatment of acute complications. Demonstrates understanding / competency   Patient understands prevention, detection, and treatment of chronic complications. Demonstrates understanding / competency   Patient understands how to develop strategies to address psychosocial issues. Demonstrates understanding / competency   Patient understands how to develop strategies to promote health/change behavior. Demonstrates understanding / competency     Complications   Last HgB A1C per patient/outside source 7.7 %   How often do you check your blood sugar? 3-4 times/day   Fasting Blood glucose range (mg/dL) 130-179   Postprandial Blood glucose range (mg/dL) >200   Number of hypoglycemic episodes per month 0   Number of hyperglycemic episodes per week 15   Can you tell when your blood sugar is high? Yes   What do you do if your blood sugar is high? water   Have you had a dilated eye exam in the past 12 months? Yes   Have you had a dental exam in the past 12 months? Yes   Are you checking your feet? Yes   How many days per week are you checking your feet? 7     Dietary Intake   Breakfast Special K cereal and 1/2 banana and  coffe   Lunch Grilled chicken sandwich on ww bread and water   Dinner Toss salad and chicken with onions and peppers and 1/2 slice bread, water   Beverage(s) water     Exercise   Exercise Type ADL's   How many days per week to you exercise? 20   How many minutes per day do you exercise? 3   Total minutes per week of exercise 60     Patient Education   Disease state  Explored patient's options for treatment of their diabetes   Nutrition  management  Role of diet in the treatment of diabetes and the relationship between the three main macronutrients and blood glucose level;Food label reading, portion sizes and measuring food.;Carbohydrate counting;Reviewed blood glucose goals for pre and post meals and how to evaluate the patients' food intake on their blood glucose level.;Meal timing in regards to the patients' current diabetes medication.;Information on hints to eating out and maintain blood glucose control.   Physical activity and exercise  Role of exercise on diabetes management, blood pressure control and cardiac health.;Identified with patient nutritional and/or medication changes necessary with exercise.;Helped patient identify appropriate exercises in relation to his/her diabetes, diabetes complications and other health issue.   Medications Reviewed patients medication for diabetes, action, purpose, timing of dose and side effects.   Monitoring Purpose and frequency of SMBG.;Interpreting lab values - A1C, lipid, urine microalbumina.;Identified appropriate SMBG and/or A1C goals.   Acute complications Taught treatment of hypoglycemia - the 15 rule.;Discussed and identified patients' treatment of hyperglycemia.   Chronic complications Relationship between chronic complications and blood glucose control;Assessed and discussed foot care and prevention of foot problems;Identified and discussed with patient  current chronic complications;Lipid levels, blood glucose control and heart disease;Retinopathy and reason for yearly dilated eye exams;Reviewed with patient heart disease, higher risk of, and prevention   Psychosocial adjustment Worked with patient to identify barriers to care and solutions;Role of stress on diabetes;Travel strategies;Identified and addressed patients feelings and concerns about diabetes   Personal strategies to promote health Review risk of smoking and offered smoking cessation;Helped patient develop diabetes management  plan for (enter comment)     Individualized Goals (developed by patient)   Nutrition Follow meal plan discussed;General guidelines for healthy choices and portions discussed;Adjust meds/carbs with exercise as discussed   Physical Activity Exercise 3-5 times per week;45 minutes per day   Medications take my medication as prescribed   Monitoring  test my blood glucose as discussed;test blood glucose pre and post meals as discussed   Reducing Risk get labs drawn;do foot checks daily;increase portions of nuts and seeds;increase portions of olive oil in diet;increase portions of healthy fats     Post-Education Assessment   Patient understands the diabetes disease and treatment process. Demonstrates understanding / competency   Patient understands incorporating nutritional management into lifestyle. Needs Review   Patient undertands incorporating physical activity into lifestyle. Needs Review   Patient understands using medications safely. Demonstrates understanding / competency   Patient understands monitoring blood glucose, interpreting and using results Demonstrates understanding / competency   Patient understands prevention, detection, and treatment of acute complications. Demonstrates understanding / competency   Patient understands prevention, detection, and treatment of chronic complications. Demonstrates understanding / competency   Patient understands how to develop strategies to address psychosocial issues. Demonstrates understanding / competency   Patient understands how to develop strategies to promote health/change behavior. Demonstrates understanding / competency     Outcomes   Expected Outcomes Demonstrated interest in learning.  Expect positive outcomes   Future DMSE 2 wks   Program Status Completed      Individualized Plan for Diabetes Self-Management Training:   Learning Objective:  Patient will have a greater understanding of diabetes self-management. Patient education plan  is to attend individual and/or group sessions per assessed needs and concerns.   Plan:   There are no Patient Instructions on file for this visit.  Expected Outcomes:  Demonstrated interest in learning. Expect positive outcomes  Education material provided: Living Well with Diabetes, Food label handouts, A1C conversion sheet, Meal plan card, My Plate and Carbohydrate counting sheet  If problems or questions, patient to contact team via:  Phone and Email  Future DSME appointment: 2 wks

## 2016-04-17 NOTE — Procedures (Signed)
Lerna A. Merlene Laughter, MD     [email or web address]             NOCTURNAL POLYSOMNOGRAPHY   LOCATION: [facility]  Patient Name: Virginia Huber, Virginia Huber Date: 04/12/2016 Gender: Female D.O.B: Jun 05, 1946 Age (years): 86 Referring Provider: Herminio Commons Height (inches): 63 Interpreting Physician: Phillips Odor MD, ABSM Weight (lbs): 153 RPSGT: Rosebud Poles BMI: 27 MRN: [id number] Neck Size: 15.50 CLINICAL INFORMATION Sleep Study Type: NPSG Indication for sleep study: N/A Epworth Sleepiness Score: 3 SLEEP STUDY TECHNIQUE As per the AASM Manual for the Scoring of Sleep and Associated Events v2.3 (April 2016) with a hypopnea requiring 4% desaturations. The channels recorded and monitored were frontal, central and occipital EEG, electrooculogram (EOG), submentalis EMG (chin), nasal and oral airflow, thoracic and abdominal wall motion, anterior tibialis EMG, snore microphone, electrocardiogram, and pulse oximetry. MEDICATIONS Medications self-administered by patient taken the night of the study : N/A  Current Outpatient Prescriptions:  .  amLODipine (NORVASC) 10 MG tablet, TAKE ONE (1) TABLET EACH DAY, Disp: 90 tablet, Rfl: 3 .  apixaban (ELIQUIS) 5 MG TABS tablet, Take 1 tablet (5 mg total) by mouth 2 (two) times daily., Disp: 56 tablet, Rfl: 0 .  atorvastatin (LIPITOR) 80 MG tablet, Take 1 tablet (80 mg total) by mouth daily at 6 PM., Disp: 30 tablet, Rfl: 6 .  cholecalciferol (VITAMIN D) 1000 UNITS tablet, Take 1,000 Units by mouth at bedtime. , Disp: , Rfl:  .  clopidogrel (PLAVIX) 75 MG tablet, Take 1 tablet (75 mg total) by mouth daily., Disp: 90 tablet, Rfl: 3 .  furosemide (LASIX) 20 MG tablet, Take 2 tablets (40 mg total) by mouth 2 (two) times daily., Disp: 60 tablet, Rfl: 6 .  insulin NPH-regular Human (NOVOLIN 70/30) (70-30) 100 UNIT/ML injection, Inject 40 Units into the skin 2 (two) times daily with a meal., Disp: , Rfl:  .  labetalol  (NORMODYNE) 200 MG tablet, Take 2 tablets (400 mg total) by mouth 2 (two) times daily., Disp: 120 tablet, Rfl: 6 .  levothyroxine (SYNTHROID, LEVOTHROID) 150 MCG tablet, Take 150 mcg by mouth daily before breakfast., Disp: , Rfl:  .  losartan (COZAAR) 100 MG tablet, TAKE ONE (1) TABLET BY MOUTH EVERY DAY, Disp: 90 tablet, Rfl: 3 .  magnesium oxide (MAG-OX) 400 MG tablet, Take 400 mg by mouth daily., Disp: , Rfl:  .  metFORMIN (GLUCOPHAGE) 500 MG tablet, Take 500 mg by mouth 2 (two) times daily with a meal., Disp: , Rfl:  .  nitroGLYCERIN (NITROSTAT) 0.4 MG SL tablet, Place 1 tablet (0.4 mg total) under the tongue every 5 (five) minutes as needed for chest pain., Disp: 25 tablet, Rfl: 12 .  Omega-3 Fatty Acids (FISH OIL) 1200 MG CAPS, Take 1 capsule (1,200 mg total) by mouth daily., Disp: , Rfl:  .  potassium chloride SA (KLOR-CON M20) 20 MEQ tablet, Take 1 tablet (20 mEq total) by mouth daily., Disp: 90 tablet, Rfl: 3 .  rOPINIRole (REQUIP) 0.5 MG tablet, Take 0.5 mg by mouth at bedtime. , Disp: , Rfl: 0  SLEEP ARCHITECTURE The study was initiated at 11:00:17 PM and ended at 4:59:06 AM. Sleep onset time was 20.2 minutes and the sleep efficiency was 77.1%. The total sleep time was 276.5 minutes. Stage REM latency was 99.5 minutes. The patient spent 2.35% of the night in stage N1 sleep, 70.71% in stage N2 sleep, 12.12% in stage N3 and 14.83% in REM. Alpha intrusion was absent. Supine sleep was 50.11%. RESPIRATORY  PARAMETERS The overall apnea/hypopnea index (AHI) was 10.0 per hour. There were 33 total apneas, including 31 obstructive, 2 central and 0 mixed apneas. There were 13 hypopneas and 0 RERAs. The AHI during Stage REM sleep was 29.3 per hour. AHI while supine was 14.3 per hour. The mean oxygen saturation was 92.65%. The minimum SpO2 during sleep was 86.00%. Loud snoring was noted during this study. CARDIAC DATA The 2 lead EKG demonstrated sinus rhythm. The mean heart rate was N/A beats  per minute. Other EKG findings include: None. LEG MOVEMENT DATA The total PLMS were 0 with a resulting PLMS index of 0.00. Associated arousal with leg movement index was 0.0.    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 Consider formal CPAP titration recording   Delano Metz, MD Diplomate, American Board of Sleep Medicine.

## 2016-04-27 ENCOUNTER — Encounter: Payer: Self-pay | Admitting: "Endocrinology

## 2016-04-27 ENCOUNTER — Ambulatory Visit (INDEPENDENT_AMBULATORY_CARE_PROVIDER_SITE_OTHER): Payer: PPO | Admitting: "Endocrinology

## 2016-04-27 ENCOUNTER — Encounter: Payer: PPO | Attending: "Endocrinology | Admitting: Nutrition

## 2016-04-27 ENCOUNTER — Encounter: Payer: Self-pay | Admitting: Nutrition

## 2016-04-27 ENCOUNTER — Other Ambulatory Visit: Payer: Self-pay

## 2016-04-27 VITALS — BP 119/68 | HR 65 | Ht 63.0 in | Wt 152.0 lb

## 2016-04-27 VITALS — Ht 63.0 in | Wt 152.0 lb

## 2016-04-27 DIAGNOSIS — E118 Type 2 diabetes mellitus with unspecified complications: Secondary | ICD-10-CM

## 2016-04-27 DIAGNOSIS — IMO0002 Reserved for concepts with insufficient information to code with codable children: Secondary | ICD-10-CM

## 2016-04-27 DIAGNOSIS — E782 Mixed hyperlipidemia: Secondary | ICD-10-CM

## 2016-04-27 DIAGNOSIS — E1159 Type 2 diabetes mellitus with other circulatory complications: Secondary | ICD-10-CM | POA: Insufficient documentation

## 2016-04-27 DIAGNOSIS — E1165 Type 2 diabetes mellitus with hyperglycemia: Secondary | ICD-10-CM

## 2016-04-27 DIAGNOSIS — Z713 Dietary counseling and surveillance: Secondary | ICD-10-CM | POA: Diagnosis not present

## 2016-04-27 DIAGNOSIS — Z794 Long term (current) use of insulin: Secondary | ICD-10-CM

## 2016-04-27 DIAGNOSIS — E038 Other specified hypothyroidism: Secondary | ICD-10-CM

## 2016-04-27 DIAGNOSIS — I10 Essential (primary) hypertension: Secondary | ICD-10-CM | POA: Diagnosis not present

## 2016-04-27 MED ORDER — ATORVASTATIN CALCIUM 80 MG PO TABS: 80.0000 mg | ORAL_TABLET | Freq: Every day | ORAL | 6 refills | Status: DC

## 2016-04-27 MED ORDER — FISH OIL 1200 MG PO CAPS: 2.0000 | ORAL_CAPSULE | Freq: Two times a day (BID) | ORAL | 3 refills | Status: DC

## 2016-04-27 NOTE — Telephone Encounter (Signed)
Refill complete 

## 2016-04-27 NOTE — Patient Instructions (Signed)
Goals 1. Lose 5 lbs  2. Continue riding exercise bike  3. Continue to eat 45 grams of carbs per meal 4. Get 2 oz of protein with breakfast Keep up the good work!!

## 2016-04-27 NOTE — Patient Instructions (Signed)

## 2016-04-27 NOTE — Progress Notes (Signed)
Diabetes Self-Management Education  Visit Type:  Follow-up  Appt. Start Time: 1100 Appt. End Time: 1130  04/27/2016  [name], identified by name and date of birth, is a 70 y.o. female with a diagnosis of Diabetes: Type 2.   ASSESSMENT 04/27/2016  [name], identified by name and date of birth, is a 70 y.o. female with a diagnosis of Diabetes:  .   Feel better over all. Keep food journal and counting carbs.  Was on 70/30 40 units BID. Had 2 low blood sugars,; 67 and 72 mg/dl.Lost 2 lbs. Metformin 500 mg BID. Saw Dr. Dorris Fetch today. Gong to reduce 70/30  to 30 units in am and evening to 40 units at night. Lab Results  Component Value Date   HGBA1C 7.7 (H) 04/14/2016    Height 5\' 3"  (1.6 m), weight 152 lb (68.9 kg). Body mass index is 26.93 kg/m.       Diabetes Self-Management Education - 04/27/16 1051      Health Coping   How would you rate your overall health? Good     Pre-Education Assessment   Patient understands the diabetes disease and treatment process. Demonstrates understanding / competency   Patient understands incorporating nutritional management into lifestyle. Demonstrates understanding / competency   Patient undertands incorporating physical activity into lifestyle. Demonstrates understanding / competency   Patient understands using medications safely. Demonstrates understanding / competency   Patient understands monitoring blood glucose, interpreting and using results Demonstrates understanding / competency   Patient understands prevention, detection, and treatment of acute complications. Demonstrates understanding / competency   Patient understands prevention, detection, and treatment of chronic complications. Demonstrates understanding / competency   Patient understands how to develop strategies to address psychosocial issues. Demonstrates understanding / competency   Patient understands how to develop strategies to promote health/change behavior.  Demonstrates understanding / competency     Complications   Last HgB A1C per patient/outside source 7.7 %   How often do you check your blood sugar? 3-4 times/day   Fasting Blood glucose range (mg/dL) 130-179;180-200   Postprandial Blood glucose range (mg/dL) 180-200   Number of hypoglycemic episodes per month 2   Are you checking your feet? Yes   How many days per week are you checking your feet? 7     Dietary Intake   Breakfast PB toast, and slices strawberries. water   Lunch  Chicken, green beans, turnip greens, potato salad and fruit   Dinner  Chicken, green beans and fruit   Beverage(s) water     Exercise   Exercise Type Light (walking / raking leaves)   How many days per week to you exercise? 30   How many minutes per day do you exercise? 4   Total minutes per week of exercise 120     Patient Education   Nutrition management  Role of diet in the treatment of diabetes and the relationship between the three main macronutrients and blood glucose level;Food label reading, portion sizes and measuring food.;Carbohydrate counting;Reviewed blood glucose goals for pre and post meals and how to evaluate the patients' food intake on their blood glucose level.;Information on hints to eating out and maintain blood glucose control.   Physical activity and exercise  Role of exercise on diabetes management, blood pressure control and cardiac health.   Medications Taught/reviewed insulin injection, site rotation, insulin storage and needle disposal.;Reviewed patients medication for diabetes, action, purpose, timing of dose and side effects.   Monitoring Purpose and frequency of SMBG.;Interpreting  lab values - A1C, lipid, urine microalbumina.   Acute complications Taught treatment of hypoglycemia - the 15 rule.   Chronic complications Relationship between chronic complications and blood glucose control;Assessed and discussed foot care and prevention of foot problems;Identified and discussed with  patient  current chronic complications;Retinopathy and reason for yearly dilated eye exams;Nephropathy, what it is, prevention of, the use of ACE, ARB's and early detection of through urine microalbumia.;Dental care;Lipid levels, blood glucose control and heart disease   Psychosocial adjustment Worked with patient to identify barriers to care and solutions;Travel strategies;Role of stress on diabetes   Personal strategies to promote health Lifestyle issues that need to be addressed for better diabetes care     Individualized Goals (developed by patient)   Nutrition Follow meal plan discussed;General guidelines for healthy choices and portions discussed;Adjust meds/carbs with exercise as discussed   Physical Activity Exercise 3-5 times per week;30 minutes per day   Medications take my medication as prescribed   Monitoring  test my blood glucose as discussed;test blood glucose pre and post meals as discussed     Patient Self-Evaluation of Goals - Patient rates self as meeting previously set goals (% of time)   Nutrition >75%   Physical Activity >75%   Medications >75%   Monitoring >75%   Problem Solving >75%   Health Coping >75%     Post-Education Assessment   Patient understands the diabetes disease and treatment process. Demonstrates understanding / competency   Patient understands incorporating nutritional management into lifestyle. Demonstrates understanding / competency   Patient undertands incorporating physical activity into lifestyle. Demonstrates understanding / competency   Patient understands using medications safely. Demonstrates understanding / competency   Patient understands monitoring blood glucose, interpreting and using results Demonstrates understanding / competency   Patient understands prevention, detection, and treatment of acute complications. Demonstrates understanding / competency   Patient understands prevention, detection, and treatment of chronic complications.  Demonstrates understanding / competency   Patient understands how to develop strategies to address psychosocial issues. Demonstrates understanding / competency   Patient understands how to develop strategies to promote health/change behavior. Demonstrates understanding / competency     Outcomes   Program Status Completed      Learning Objective:  Patient will have a greater understanding of diabetes self-management. Patient education plan is to attend individual and/or group sessions per assessed needs and concerns.   Plan:   Patient Instructions  Goals 1. Lose 5 lbs  2. Continue riding exercise bike  3. Continue to eat 45 grams of carbs per meal 4. Get 2 oz of protein with breakfast Keep up the good work!!    Expected Outcomes:  Demonstrated interest in learning. Expect positive outcomes  Education material provided: Meal plan card and My Plate  If problems or questions, patient to contact team via:  Phone and Email  Future DSME appointment: - 3-4 months

## 2016-04-27 NOTE — Progress Notes (Signed)
Subjective:    Patient ID: Virginia Huber, female    DOB: [birth year]. Patient is being seen in f/u for management of diabetes requested by  Purvis Kilts, MD  Past Medical History:  Diagnosis Date  . Arthritis   . Atrial fibrillation and flutter ([place])    a. h/o PAF/flutter during admission in 2013 for PNA. b. PAF during adm for NSTEMI 07/2015.  [name] B12 deficiency anemia   . Coronary artery disease 11/30/2014   a. remote MI. b. h/o PTCA with scoring balloon to OM1 11/2014. c. NSTEMI 03/2015 s/p DES to prox-mid Cx. d. NSTEMI 07/2015 s/p scoring balloon/PTCA/DES to dRCA with PAF during that admission  . Cutaneous lupus erythematosus   . GERD (gastroesophageal reflux disease)   . Heart block   . History of blood transfusion 1980's   2nd surgical procedures  . HTN (hypertension)   . Hypercholesteremia   . Hypothyroidism   . Myocardial infarction 02/2012  . Ovarian tumor   . PAD (peripheral artery disease) ([place])    a. s/p LE angio 2015; followed by Dr. Fletcher Anon - managed medically.  . Pain with urination 05/08/2015  . Superficial fungus infection of skin 06/29/2013  . TIA (transient ischemic attack) 08/2001; ~ 2006  . Type II diabetes mellitus ([place])   . UTI (urinary tract infection) 05/08/2013   Past Surgical History:  Procedure Laterality Date  . ABDOMINAL AORTAGRAM N/A 01/03/2014   Procedure: ABDOMINAL [name];  Surgeon: Wellington Hampshire, MD;  Location: [facility];  Service: Cardiovascular;  Laterality: N/A;  . ABDOMINAL HYSTERECTOMY  1972   "partial"  . APPENDECTOMY  1970's  . CARDIAC CATHETERIZATION  2008   Tiny OM-2 with 90% narrowing. Med tx.  [name] CARDIAC CATHETERIZATION N/A 11/30/2014   Procedure: Left Heart Cath and Coronary Angiography;  Surgeon: Troy Sine, MD; LAD 20%, CFX 50%, OM1 95%, right PLB 30%, LV normal   . CARDIAC CATHETERIZATION N/A 11/30/2014   Procedure: Coronary Balloon Angioplasty;  Surgeon: Troy Sine, MD;  Angiosculpt scoring balloon and PTCA to  the OM1 reducing stenosis from 95% to less than 10%  . CARDIAC CATHETERIZATION N/A 04/03/2015   Procedure: Left Heart Cath and Coronary Angiography;  Surgeon: Jolaine Artist, MD; dLAD 50%, CFX 90%, OM1 100%, PLA 15%, LVEDP 13    . CARDIAC CATHETERIZATION N/A 04/03/2015   Procedure: Coronary Stent Intervention;  Surgeon: Sherren Mocha, MD; 3.0x18 mm Xience DES to the CFX    . CARDIAC CATHETERIZATION N/A 08/02/2015   Procedure: Left Heart Cath and Coronary Angiography;  Surgeon: Troy Sine, MD;  Location: [facility];  Service: Cardiovascular;  Laterality: N/A;  . CARDIAC CATHETERIZATION N/A 08/02/2015   Procedure: Coronary Stent Intervention;  Surgeon: Troy Sine, MD;  Location: [facility];  Service: Cardiovascular;  Laterality: N/A;  . cardiac stents    . CHOLECYSTECTOMY OPEN  1990's  . COLONOSCOPY  2005   Dr. Laural Golden: pancolonic divericula, polyp, path unknown currently  . COLONOSCOPY  2012   Dr. Oneida Alar: Normal TI, scattered diverticula in entire colon, small internal hemorrhoids, normal colon biopsies. Colonoscopy in 5-10 years.   . COLOSTOMY  05/1979  . COLOSTOMY REVERSAL  11/1979  . EXCISIONAL HEMORRHOIDECTOMY  1970's  . EYE SURGERY Left 2000   "branch vein occlusion"  . EYE SURGERY Left ~ 2001   "smoothed out wrinkle"  . LEFT OOPHORECTOMY  05/1979   nicked bowel, peritonitis, colostomy; colostomy reversed 1981   .  LOWER EXTREMITY ANGIOGRAM N/A 01/03/2014   Procedure: LOWER EXTREMITY ANGIOGRAM;  Surgeon: Wellington Hampshire, MD;  Location: [facility];  Service: Cardiovascular;  Laterality: N/A;  . Nuclear med stress test  10/2011   Small area of mild ischemia inferoapically.  [name] PARTIAL HYSTERECTOMY  1970's   left ovaries, then ovaries removed later due tumors   . RIGHT OOPHORECTOMY  1970's   Social History   Social History  . Marital status: Married    Spouse name: N/A  . Number of children: N/A  . Years of education: N/A   Occupational History  .  Retired Retired    Research officer, political party   Social History Main Topics  . Smoking status: Never Smoker  . Smokeless tobacco: Never Used     Comment: Never smoked  . Alcohol use No  . Drug use: No  . Sexual activity: No     Comment: hyst   Other Topics Concern  . None   Social History Narrative  . None   Outpatient Encounter Prescriptions as of 04/27/2016  Medication Sig  . amLODipine (NORVASC) 10 MG tablet TAKE ONE (1) TABLET EACH DAY  . apixaban (ELIQUIS) 5 MG TABS tablet Take 1 tablet (5 mg total) by mouth 2 (two) times daily.  [name] atorvastatin (LIPITOR) 80 MG tablet Take 1 tablet (80 mg total) by mouth daily at 6 PM.  . cholecalciferol (VITAMIN D) 1000 UNITS tablet Take 1,000 Units by mouth at bedtime.   . clopidogrel (PLAVIX) 75 MG tablet Take 1 tablet (75 mg total) by mouth daily.  . furosemide (LASIX) 20 MG tablet Take 2 tablets (40 mg total) by mouth 2 (two) times daily.  . insulin NPH-regular Human (NOVOLIN 70/30) (70-30) 100 UNIT/ML injection Inject 30-40 Units into the skin 2 (two) times daily with a meal. Inject 30 units with breakfast and 40 units with supper only if pre-meal blood glucose is above 90 mg/dL.  [name] labetalol (NORMODYNE) 200 MG tablet Take 2 tablets (400 mg total) by mouth 2 (two) times daily.  [name] levothyroxine (SYNTHROID, LEVOTHROID) 150 MCG tablet Take 150 mcg by mouth daily before breakfast.  . losartan (COZAAR) 100 MG tablet TAKE ONE (1) TABLET BY MOUTH EVERY DAY  . magnesium oxide (MAG-OX) 400 MG tablet Take 400 mg by mouth daily.  . metFORMIN (GLUCOPHAGE) 500 MG tablet Take 500 mg by mouth 2 (two) times daily with a meal.  . nitroGLYCERIN (NITROSTAT) 0.4 MG SL tablet Place 1 tablet (0.4 mg total) under the tongue every 5 (five) minutes as needed for chest pain.  . Omega-3 Fatty Acids (FISH OIL) 1200 MG CAPS Take 2 capsules (2,400 mg total) by mouth 2 (two) times daily.  . potassium chloride SA (KLOR-CON M20) 20 MEQ tablet Take 1 tablet (20 mEq total) by mouth  daily.  [name] rOPINIRole (REQUIP) 0.5 MG tablet Take 0.5 mg by mouth at bedtime.   . [DISCONTINUED] Omega-3 Fatty Acids (FISH OIL) 1200 MG CAPS Take 1 capsule (1,200 mg total) by mouth daily.   No facility-administered encounter medications on file as of 04/27/2016.    ALLERGIES: VACCINATION STATUS: Immunization History  Administered Date(s) Administered  . Influenza-Unspecified 03/27/2015  . Pneumococcal-Unspecified 02/21/2012    Diabetes  She presents for her follow-up diabetic visit. She has type 2 diabetes mellitus. Onset time: She was diagnosed at approximate age of 37 years. Her disease course has been improving. There are no hypoglycemic associated symptoms. Pertinent negatives for hypoglycemia include no confusion, headaches, pallor, seizures or  tremors. Associated symptoms include fatigue. Pertinent negatives for diabetes include no chest pain, no polydipsia, no polyphagia and no polyuria. There are no hypoglycemic complications. Symptoms are improving. Diabetic complications include a CVA and heart disease. Risk factors for coronary artery disease include diabetes mellitus, dyslipidemia, hypertension and sedentary lifestyle. Current diabetic treatments: She is on Novolin 70/30 60 units twice a day, metformin 500 mg 3 times a day. Her weight is stable. She is following a generally unhealthy diet. When asked about meal planning, she reported none. She has not had a previous visit with a dietitian. She participates in exercise intermittently. Home blood sugar record trend: She did not bring the meter to review today. She mentions that she is monitoring randomly and regular.[name] Her breakfast blood glucose range is generally 140-180 mg/dl. Her lunch blood glucose range is generally 110-130 mg/dl. Her overall blood glucose range is 140-180 mg/dl. An ACE inhibitor/angiotensin II receptor blocker is being taken. Eye exam is current.  Hyperlipidemia  This is a chronic problem. The current episode started  more than 1 year ago. The problem is uncontrolled. Recent lipid tests were reviewed and are high. Exacerbating diseases include diabetes and hypothyroidism. Pertinent negatives include no chest pain, myalgias or shortness of breath. Current antihyperlipidemic treatment includes statins, fibric acid derivatives and bile acid squestrants. The current treatment provides moderate improvement of lipids.  Hypertension  This is a chronic problem. The current episode started more than 1 year ago. The problem is uncontrolled. Pertinent negatives include no chest pain, headaches, palpitations or shortness of breath. Risk factors for coronary artery disease include dyslipidemia, diabetes mellitus and sedentary lifestyle. Past treatments include angiotensin blockers. Hypertensive end-organ damage includes CAD/MI and CVA.     Review of Systems  Constitutional: Positive for fatigue. Negative for chills, fever and unexpected weight change.  HENT: Negative for trouble swallowing and voice change.   Eyes: Negative for visual disturbance.  Respiratory: Negative for cough, shortness of breath and wheezing.   Cardiovascular: Negative for chest pain, palpitations and leg swelling.       No Shortness of breath  Gastrointestinal: Negative for abdominal pain, diarrhea, nausea and vomiting.  Endocrine: Negative for cold intolerance, heat intolerance, polydipsia, polyphagia and polyuria.  Genitourinary: Negative for frequency, hematuria and urgency.  Musculoskeletal: Negative for arthralgias and myalgias.  Skin: Negative for color change, pallor, rash and wound.  Neurological: Negative for tremors, seizures and headaches.  Hematological: Does not bruise/bleed easily.  Psychiatric/Behavioral: Negative for confusion, hallucinations and suicidal ideas.    Objective:    BP 119/68   Pulse 65   Ht 5\' 3"  (1.6 m)   Wt 152 lb (68.9 kg)   BMI 26.93 kg/m   Wt Readings from Last 3 Encounters:  04/27/16 152 lb (68.9 kg)   04/16/16 154 lb (69.9 kg)  04/14/16 154 lb (69.9 kg)    Physical Exam  Constitutional: She is oriented to person, place, and time. She appears well-developed.  HENT:  Head: Normocephalic and atraumatic.  Eyes: EOM are normal.  Neck: Normal range of motion. Neck supple. No tracheal deviation present. No thyromegaly present.  Cardiovascular: Normal rate and regular rhythm.   Pulmonary/Chest: Effort normal and breath sounds normal.  Abdominal: Soft. Bowel sounds are normal. There is no tenderness. There is no guarding.  Musculoskeletal: Normal range of motion. She exhibits no edema.  Neurological: She is alert and oriented to person, place, and time. She has normal reflexes. No cranial nerve deficit. Coordination normal.  Skin: Skin is warm  and dry. No rash noted. No erythema. No pallor.  Psychiatric: She has a normal mood and affect. Judgment normal.    CMP     Component Value Date/Time   NA 139 04/14/2016 0910   K 4.4 04/14/2016 0910   CL 105 04/14/2016 0910   CO2 24 04/14/2016 0910   GLUCOSE 254 (H) 04/14/2016 0910   BUN 13 04/14/2016 0910   CREATININE 0.88 04/14/2016 0910   CALCIUM 9.2 04/14/2016 0910   PROT 6.0 (L) 04/14/2016 0910   ALBUMIN 4.1 04/14/2016 0910   AST 9 (L) 04/14/2016 0910   ALT 13 04/14/2016 0910   ALKPHOS 80 04/14/2016 0910   BILITOT 0.5 04/14/2016 0910   GFRNONAA >60 10/29/2015 0508   GFRAA >60 10/29/2015 0508     Diabetic Labs (most recent): Lab Results  Component Value Date   HGBA1C 7.7 (H) 04/14/2016   HGBA1C 7.5 (H) 10/28/2015   HGBA1C 7.5 (H) 09/17/2015     Lipid Panel ( most recent) Lipid Panel     Component Value Date/Time   CHOL 116 (L) 04/14/2016 0910   TRIG 439 (H) 04/14/2016 0910   HDL 24 (L) 04/14/2016 0910   CHOLHDL 4.8 04/14/2016 0910   VLDL NOT CALC 04/14/2016 0910   LDLCALC NOT CALC 04/14/2016 0910       Assessment & Plan:   1. DM type 2 causing vascular disease ([place]) - Patient has currently uncontrolled  symptomatic type 2 DM since  70 years of age,  with most recent A1c of  7.7% stable from last visit  when it was 7.5 %.  - She has no recent labs to review, I will send her to get fresh set of labs.  -Her diabetes is complicated by coronary artery disease which is recurrent requiring stent placement , CVA, and patient remains at a high risk for more acute and chronic complications of diabetes which include CAD, CVA, CKD, retinopathy, and neuropathy. These are all discussed in detail with the patient.  - I have counseled the patient on diet management and weight loss, by adopting a carbohydrate restricted/protein rich diet.  - Suggestion is made for patient to avoid simple carbohydrates   from their diet including Cakes , Desserts, Ice Cream,  Soda (  diet and regular) , Sweet Tea , Candies,  Chips, Cookies, Artificial Sweeteners,   and "Sugar-free" Products . This will help patient to have stable blood glucose profile and potentially avoid unintended weight gain.  - I encouraged the patient to switch to  unprocessed or minimally processed complex starch and increased protein intake (animal or plant source), fruits, and vegetables.  - Patient is advised to stick to a routine mealtimes to eat 3 meals  a day and avoid unnecessary snacks ( to snack only to correct hypoglycemia).  - The patient will be scheduled with Jearld Fenton, RDN, CDE for individualized DM education.  - I have approached patient with the following individualized plan to manage diabetes and patient agrees:   - Due to cost reasons , and because of the fact that she is responding appropriately, I will keep her on  Novolin 70/30. - I will adjust Novolin 70/30 to 30 units with breakfast and 40 units with supper when pre-meal blood glucose is above 90 mg/dL. - She is advised to start strict monitoring of glucose  AC and HS. - If her blood glucose profile is significantly abnormal, she will be considered for insulin analogs. - Patient  is warned not to take  insulin without proper monitoring per orders.  -Patient is encouraged to call clinic for blood glucose levels less than 70 or above 300 mg /dl. - I will and tinea metformin  500 mg by mouth twice a day due to some GI complaints ,  Still therapeutically suitable for patient.   - Patient specific target  A1c;  LDL, HDL, Triglycerides, and  Waist Circumference were discussed in detail.  2) BP/HTN: Uncontrolled. Continue current medications including ACEI/ARB. 3) Lipids/HPL:  Uncontrolled with triglycerides of 439, LDL was not calculated. I advised her to continue statins and increase her fish oil to 2 capsules twice a day. 4)  Weight/Diet: CDE Consult has been initiated , exercise, and detailed carbohydrates information provided.   5) hypothyroidism: Long-standing for at least 20 years.  - Her labs are consistent with appropriate replacement. I will continue levothyroxine 150 g by mouth every morning.  - We discussed about correct intake of levothyroxine, at fasting, with water, separated by at least 30 minutes from breakfast, and separated by more than 4 hours from calcium, iron, multivitamins, acid reflux medications (PPIs). -Patient is made aware of the fact that thyroid hormone replacement is needed for life, dose to be adjusted by periodic monitoring of thyroid function tests.  6) Chronic Care/Health Maintenance:  -Patient is on ACEI/ARB and Statin medications and encouraged to continue to follow up with Ophthalmology, Podiatrist at least yearly or according to recommendations, and advised to  stay away from smoking. I have recommended yearly flu vaccine and pneumonia vaccination at least every 5 years; moderate intensity exercise for up to 150 minutes weekly; and  sleep for at least 7 hours a day.  - 30 minutes of time was spent on the care of this patient , 50% of which was applied for counseling on diabetes complications and their preventions.  - Patient to bring  meter and  blood glucose logs during their next visit.   - I advised patient to maintain close follow up with Purvis Kilts, MD for primary care needs.  Follow up plan: - Return in about 3 months (around 07/28/2016) for follow up with pre-visit labs, meter, and logs.  Glade Lloyd, MD Phone: [phone]  Fax: [phone]   04/27/2016, 10:44 AM

## 2016-05-11 ENCOUNTER — Telehealth: Payer: Self-pay | Admitting: Adult Health

## 2016-05-11 DIAGNOSIS — G4730 Sleep apnea, unspecified: Secondary | ICD-10-CM

## 2016-05-11 NOTE — Telephone Encounter (Signed)
Results of sleep study / tg

## 2016-05-12 NOTE — Addendum Note (Signed)
Addended by: Barbarann Ehlers A on: 05/12/2016 10:39 AM   Modules accepted: Orders

## 2016-05-12 NOTE — Telephone Encounter (Signed)
Pt declines apt with Dr Hanley Seamen refer to [facility] Dr Asencion Partridge Dohmeier

## 2016-05-12 NOTE — Telephone Encounter (Addendum)
Spoke with Geneticist, molecular, Raquel Sarna v=bates with Dr Merlene Laughter regarding test results. Results found under "Notes" dated 04/12/16

## 2016-05-12 NOTE — Telephone Encounter (Signed)
Will forward to Dr. Koneswaran  

## 2016-05-12 NOTE — Telephone Encounter (Signed)
This recording reveals mostly REM related mild obstructive sleep apnea. Consider formal CPAP titration recording (these are neurologist's recommendation). Would refer to him. In any case, appears to be only mild.

## 2016-05-12 NOTE — Telephone Encounter (Signed)
There is no final summary report, just data and patient's question/answers.

## 2016-05-13 ENCOUNTER — Ambulatory Visit (INDEPENDENT_AMBULATORY_CARE_PROVIDER_SITE_OTHER): Payer: PPO | Admitting: Neurology

## 2016-05-13 ENCOUNTER — Encounter: Payer: Self-pay | Admitting: Neurology

## 2016-05-13 VITALS — BP 115/58 | HR 59 | Resp 20 | Ht 63.0 in | Wt 151.0 lb

## 2016-05-13 DIAGNOSIS — I5020 Unspecified systolic (congestive) heart failure: Secondary | ICD-10-CM

## 2016-05-13 DIAGNOSIS — G452 Multiple and bilateral precerebral artery syndromes: Secondary | ICD-10-CM

## 2016-05-13 DIAGNOSIS — J301 Allergic rhinitis due to pollen: Secondary | ICD-10-CM

## 2016-05-13 DIAGNOSIS — G4733 Obstructive sleep apnea (adult) (pediatric): Secondary | ICD-10-CM

## 2016-05-13 DIAGNOSIS — I480 Paroxysmal atrial fibrillation: Secondary | ICD-10-CM

## 2016-05-13 DIAGNOSIS — M25472 Effusion, left ankle: Secondary | ICD-10-CM | POA: Diagnosis not present

## 2016-05-13 DIAGNOSIS — I481 Persistent atrial fibrillation: Secondary | ICD-10-CM

## 2016-05-13 DIAGNOSIS — M25471 Effusion, right ankle: Secondary | ICD-10-CM

## 2016-05-13 DIAGNOSIS — I2510 Atherosclerotic heart disease of native coronary artery without angina pectoris: Secondary | ICD-10-CM

## 2016-05-13 DIAGNOSIS — I4819 Other persistent atrial fibrillation: Secondary | ICD-10-CM

## 2016-05-13 HISTORY — DX: Multiple and bilateral precerebral artery syndromes: G45.2

## 2016-05-13 HISTORY — DX: Obstructive sleep apnea (adult) (pediatric): G47.33

## 2016-05-13 HISTORY — DX: Unspecified systolic (congestive) heart failure: I50.20

## 2016-05-13 MED ORDER — SALINE SPRAY 0.65 % NA SOLN: 1.0000 | NASAL | 0 refills | Status: DC | PRN

## 2016-05-13 NOTE — Progress Notes (Signed)
SLEEP MEDICINE CLINIC   Provider:  Larey Seat, M D  Referring Provider: Sharilyn Sites, MD Primary Care Physician:  Purvis Kilts, MD  Chief Complaint  Patient presents with  . New Patient (Initial Visit)    has had sleep study, has not gotten results    HPI:  Virginia Huber is a 70 y.o. female , seen here as a referral from Dr. Kate Sable,  [name] was referred by her cardiologist for a sleep study to the any [facility] location, she completed the sleep study but then was never given the results, a phone call or a follow-up appointment. She stated that she had similar experiences with the physician before when she had suffered a TIA and her follow-up appointments were not properly made and results not discussed. I was surprised to see a patient who just had a sleep study on 04/12/2016 30 days later in my office to follow-up on a study that I have not performed. Based on the report the patient has a body mass index of 34, is 70 years of age, and went to the sleep lab 04/12/2016. Her AHI was only 10 per hour which constitutes mild sleep apnea. In supine sleep her AHI was 14.3, the lowest oxygen saturation at night was 86% and the technologist noted loud snoring. She slept about half of the night in supine sleep position she reached REM sleep with an AHI of 29.3 per hour.  Between June 2016 and May 2017 this patient had been admitted 5 times to hospital with atrial fibrillation and related to diastolic output failure. As she is on high embolic risk by A. fib and the related congestive heart failure, she remains on Eloquis and Plavix. Given the constellation of mild apnea with underlying  CAD , MI 4 years ago, double pneumonia in 2015, atrial fibrillation and previous stroke-TIA, congestive heart failure and REM dependent apnea, she needs to have an urgent CPAP titration.   Sleep habits are as follows: Usual bedtime for the couple is between 12 and 1 AM, usually but  neither of them has trouble to fall asleep and Mrs. Tagliaferro reports that she rises usually at 8 or 9 AM. Since she is retired she has time to sleep in. She sleeps almost equal times in supine and lateral position, she sleeps on 2 pillows for support. The bedroom is described as core, quiet and dark. Her husband uses a CPAP machine, she has nocturia frequent up to 3 times at night, fragmenting her sleep. She feels that her sleep is not restorative and refreshing and that she is a light sleeper, thus  easily awoken. Her husband has noted her to snore very loudly as a Merchant [employer] officer had reported. She often feels she hasn't slept at all because she lacks the sleeps refreshing and restorative qualities. So when she wakes up in the morning she is not necessarily feeling ready to go. She wakes up frequently with a headache in the morning indicating that she may have hypoxemia or hypercapnia. She was not checked for hypercapnia in her recent sleep study.  Sleep medical history and family sleep history:  Non family history of OSA   Social history: married, patient is a nontobacco user, has never smoked. She does not drink alcohol. She is using only decaffeinated coffees and teas. No shift work history.   Review of Systems: Out of a complete 14 system review, the patient complains of only the following symptoms, and all other reviewed systems are  negative. Loud snoring, dependent odema, atrial fib, CHF, witnessed apnea, fatigue, TIA.    Epworth score 6 , Fatigue severity score 37  , depression score 2/15    Social History   Social History  . Marital status: Married    Spouse name: N/A  . Number of children: N/A  . Years of education: N/A   Occupational History  . Retired Retired    Research officer, political party   Social History Main Topics  . Smoking status: Never Smoker  . Smokeless tobacco: Never Used     Comment: Never smoked  . Alcohol use No  . Drug use: No  . Sexual activity: No     Comment: hyst    Other Topics Concern  . Not on file   Social History Narrative  . No narrative on file    Family History  Problem Relation Age of Onset  . Heart disease Mother     deceased  . Heart disease Father     deceased, heart disease  . Diabetes Brother   . Heart disease Brother   . Thyroid disease Brother   . Heart disease Sister   . Heart disease Brother   . Thyroid disease Brother   . Lupus Daughter   . Colon cancer Neg Hx     Past Medical History:  Diagnosis Date  . Arthritis   . Atrial fibrillation and flutter ([place])    a. h/o PAF/flutter during admission in 2013 for PNA. b. PAF during adm for NSTEMI 07/2015.  [name] B12 deficiency anemia   . Coronary artery disease 11/30/2014   a. remote MI. b. h/o PTCA with scoring balloon to OM1 11/2014. c. NSTEMI 03/2015 s/p DES to prox-mid Cx. d. NSTEMI 07/2015 s/p scoring balloon/PTCA/DES to dRCA with PAF during that admission  . Cutaneous lupus erythematosus   . GERD (gastroesophageal reflux disease)   . Heart block   . History of blood transfusion 1980's   2nd surgical procedures  . HTN (hypertension)   . Hypercholesteremia   . Hypothyroidism   . Myocardial infarction 02/2012  . Ovarian tumor   . PAD (peripheral artery disease) ([place])    a. s/p LE angio 2015; followed by Dr. Fletcher Anon - managed medically.  . Pain with urination 05/08/2015  . Superficial fungus infection of skin 06/29/2013  . TIA (transient ischemic attack) 08/2001; ~ 2006  . Type II diabetes mellitus ([place])   . UTI (urinary tract infection) 05/08/2013    Past Surgical History:  Procedure Laterality Date  . ABDOMINAL AORTAGRAM N/A 01/03/2014   Procedure: ABDOMINAL [name];  Surgeon: Wellington Hampshire, MD;  Location: [facility];  Service: Cardiovascular;  Laterality: N/A;  . ABDOMINAL HYSTERECTOMY  1972   "partial"  . APPENDECTOMY  1970's  . CARDIAC CATHETERIZATION  2008   Tiny OM-2 with 90% narrowing. Med tx.  [name] CARDIAC CATHETERIZATION N/A 11/30/2014   Procedure: Left  Heart Cath and Coronary Angiography;  Surgeon: Troy Sine, MD; LAD 20%, CFX 50%, OM1 95%, right PLB 30%, LV normal   . CARDIAC CATHETERIZATION N/A 11/30/2014   Procedure: Coronary Balloon Angioplasty;  Surgeon: Troy Sine, MD;  Angiosculpt scoring balloon and PTCA to the OM1 reducing stenosis from 95% to less than 10%  . CARDIAC CATHETERIZATION N/A 04/03/2015   Procedure: Left Heart Cath and Coronary Angiography;  Surgeon: Jolaine Artist, MD; dLAD 50%, CFX 90%, OM1 100%, PLA 15%, LVEDP 13    . CARDIAC CATHETERIZATION N/A 04/03/2015   Procedure: Coronary Stent  Intervention;  Surgeon: Sherren Mocha, MD; 3.0x18 mm Xience DES to the CFX    . CARDIAC CATHETERIZATION N/A 08/02/2015   Procedure: Left Heart Cath and Coronary Angiography;  Surgeon: Troy Sine, MD;  Location: [facility];  Service: Cardiovascular;  Laterality: N/A;  . CARDIAC CATHETERIZATION N/A 08/02/2015   Procedure: Coronary Stent Intervention;  Surgeon: Troy Sine, MD;  Location: [facility];  Service: Cardiovascular;  Laterality: N/A;  . cardiac stents    . CHOLECYSTECTOMY OPEN  1990's  . COLONOSCOPY  2005   Dr. Laural Golden: pancolonic divericula, polyp, path unknown currently  . COLONOSCOPY  2012   Dr. Oneida Alar: Normal TI, scattered diverticula in entire colon, small internal hemorrhoids, normal colon biopsies. Colonoscopy in 5-10 years.   . COLOSTOMY  05/1979  . COLOSTOMY REVERSAL  11/1979  . EXCISIONAL HEMORRHOIDECTOMY  1970's  . EYE SURGERY Left 2000   "branch vein occlusion"  . EYE SURGERY Left ~ 2001   "smoothed out wrinkle"  . LEFT OOPHORECTOMY  05/1979   nicked bowel, peritonitis, colostomy; colostomy reversed 1981   . LOWER EXTREMITY ANGIOGRAM N/A 01/03/2014   Procedure: LOWER EXTREMITY ANGIOGRAM;  Surgeon: Wellington Hampshire, MD;  Location: [facility];  Service: Cardiovascular;  Laterality: N/A;  . Nuclear med stress test  10/2011   Small area of mild ischemia inferoapically.  [name] PARTIAL  HYSTERECTOMY  1970's   left ovaries, then ovaries removed later due tumors   . RIGHT OOPHORECTOMY  1970's    Current Outpatient Prescriptions  Medication Sig Dispense Refill  . amLODipine (NORVASC) 10 MG tablet TAKE ONE (1) TABLET EACH DAY 90 tablet 3  . apixaban (ELIQUIS) 5 MG TABS tablet Take 1 tablet (5 mg total) by mouth 2 (two) times daily. 56 tablet 0  . atorvastatin (LIPITOR) 80 MG tablet Take 1 tablet (80 mg total) by mouth daily at 6 PM. 30 tablet 6  . cholecalciferol (VITAMIN D) 1000 UNITS tablet Take 1,000 Units by mouth at bedtime.     . clopidogrel (PLAVIX) 75 MG tablet Take 1 tablet (75 mg total) by mouth daily. 90 tablet 3  . furosemide (LASIX) 20 MG tablet Take 2 tablets (40 mg total) by mouth 2 (two) times daily. 60 tablet 6  . insulin NPH-regular Human (NOVOLIN 70/30) (70-30) 100 UNIT/ML injection Inject 30-40 Units into the skin 2 (two) times daily with a meal. Inject 30 units with breakfast and 40 units with supper only if pre-meal blood glucose is above 90 mg/dL.    [name] labetalol (NORMODYNE) 200 MG tablet Take 2 tablets (400 mg total) by mouth 2 (two) times daily. 120 tablet 6  . levothyroxine (SYNTHROID, LEVOTHROID) 150 MCG tablet Take 150 mcg by mouth daily before breakfast.    . losartan (COZAAR) 100 MG tablet TAKE ONE (1) TABLET BY MOUTH EVERY DAY 90 tablet 3  . magnesium oxide (MAG-OX) 400 MG tablet Take 400 mg by mouth daily.    . metFORMIN (GLUCOPHAGE) 500 MG tablet Take 500 mg by mouth 2 (two) times daily with a meal.    . nitroGLYCERIN (NITROSTAT) 0.4 MG SL tablet Place 1 tablet (0.4 mg total) under the tongue every 5 (five) minutes as needed for chest pain. 25 tablet 12  . Omega-3 Fatty Acids (FISH OIL) 1200 MG CAPS Take 2 capsules (2,400 mg total) by mouth 2 (two) times daily. 120 each 3  . potassium chloride SA (KLOR-CON M20) 20 MEQ tablet Take 1 tablet (20 mEq total) by  mouth daily. 90 tablet 3  . rOPINIRole (REQUIP) 0.5 MG tablet Take 0.5 mg by mouth at bedtime.    0   No current facility-administered medications for this visit.     Allergies as of 05/13/2016 - Review Complete 05/13/2016  Allergen Reaction Noted  . Penicillins Hives   . Percocet [oxycodone-acetaminophen] Nausea And Vomiting 02/27/2012    Vitals: BP (!) 115/58   Pulse (!) 59   Resp 20   Ht 5\' 3"  (1.6 m)   Wt 151 lb (68.5 kg)   BMI 26.75 kg/m  Last Weight:  Wt Readings from Last 1 Encounters:  05/13/16 151 lb (68.5 kg)   NLZ:JQBH mass index is 26.75 kg/m.     Last Height:   Ht Readings from Last 1 Encounters:  05/13/16 5\' 3"  (1.6 m)    Physical exam:  General: The patient is awake, alert and appears not in acute distress. The patient is well groomed. Head: Normocephalic, atraumatic. Neck is supple. Mallampati 3  neck circumference: 15.25 . Nasal airflow congested,  TMJ click not  evident . Retrognathia is not seen.  Cardiovascular:  Regular rate and rhythm, without  murmurs or carotid bruit, and without distended neck veins. Respiratory: Lungs are clear to auscultation. Skin:  Without evidence of edema, or rash Trunk: BMI is 27.   Neurologic exam : The patient is awake and alert, oriented to place and time.    Attention span & concentration ability appears normal.  Speech is fluent,  without  dysarthria, dysphonia or aphasia.  Mood and affect are appropriate.  Cranial nerves: Pupils are equal and briskly reactive to light. Funduscopic exam without  evidence of pallor or edema. Amblyopia left eye, branch vein occlusion. ( eye stroke )  Hearing to finger rub intact.   Facial sensation intact to fine touch.  Facial motor strength is symmetric and tongue and uvula move midline. Shoulder shrug was symmetrical.   Motor exam:  Normal tone, muscle bulk and symmetric strength in all extremities. Right hand grip strength is stronger than left, bilateral equal strength, no cogwheeling and no rigor Sensory:  Fine touch, pinprick and vibration were reduced below the knee  level, loss of primary modalities at the ankle level. Patient reports she has peripheral arterial peripheral vascular disease Coordination: Rapid alternating movements in the fingers/hands was normal. Finger-to-nose maneuver  normal without evidence of ataxia, dysmetria or tremor. Gait and station: Patient walks without assistive device and is able unassisted to climb up to the exam table. Strength within normal limits.  Stance is stable and normal.   Deep tendon reflexes: in the  upper and lower extremities are symmetric and intact.   The patient was advised of the nature of the diagnosed sleep disorder , the treatment options and risks for general a health and wellness arising from not treating the condition.  I spent more than 45  minutes of face to face time with the patient. Greater than 50% of time was spent in counseling and coordination of care. We have discussed the diagnosis and differential and I answered the patient's questions.     Assessment:  After physical and neurologic examination, review of laboratory studies,  Personal review of imaging studies, reports of other /same  Imaging studies ,  Results of polysomnography/ neurophysiology testing and pre-existing records as far as provided in visit., my assessment is   1)  patient is post peripheral arterial disease, ankle edema,  atrial fibrillation, paroxysmal atrial fibrillation with high embolism risk, on  chronic anticoagulation, with a history of 5 admissions in 12 months for atrial fibrillation related to congestive heart failure, in addition history of TIA and stroke, retinal vein occlusion, coronary artery disease and myocardial infarction. This patient has high risk of recurrent atrial fibrillation if her obstructive sleep apnea would not be sufficiently treated. Given the comorbidities and her diagnosis as established and Dr. Freddie Apley , I will invite her for an urgent CPAP titration. If she should prove to have hypoxemia or  hypercapnia I would also add oxygen supplementation if needed. The patient has frequently stuffy nose congested nasal passage, and is concerned about not tolerating nasal CPAP. We will use of course heated humidification, she may do better with a saline nasal spray to just clear the nasal passage each night. I would not recommend Astelin, as it can cause higher blood pressures and vasoconstriction. Nasonex on the other hand would be allowed.     Plan:  Treatment plan and additional workup :  Urgent CPAP titration, if hypoxemia noted, start oxygen. Patient may need a sleep aid, a tylenol  PM from home. I will advise her to use saline-saltwater nasal spray to keep the nasal passages open at night. We will place her CPAP titration order as urgent. Report to Dr. Samule Ohm.      Asencion Partridge Lilyann Gravelle MD  05/13/2016   CC: Sharilyn Sites, [place], [place] [address]

## 2016-05-13 NOTE — Patient Instructions (Signed)

## 2016-05-28 ENCOUNTER — Telehealth: Payer: Self-pay

## 2016-05-28 NOTE — Telephone Encounter (Signed)
Pt at gym, I spoke with husband, asked that she bring documents for [facility] Pt asistance program

## 2016-06-02 ENCOUNTER — Other Ambulatory Visit: Payer: Self-pay | Admitting: Adult Health

## 2016-06-02 DIAGNOSIS — Z1231 Encounter for screening mammogram for malignant neoplasm of breast: Secondary | ICD-10-CM

## 2016-06-07 ENCOUNTER — Emergency Department (HOSPITAL_COMMUNITY): Payer: PPO

## 2016-06-07 ENCOUNTER — Encounter (HOSPITAL_COMMUNITY): Payer: Self-pay | Admitting: Emergency Medicine

## 2016-06-07 ENCOUNTER — Observation Stay (HOSPITAL_COMMUNITY)
Admission: EM | Admit: 2016-06-07 | Discharge: 2016-06-07 | Disposition: A | Discharge status: Home / Self Care | Payer: PPO | Attending: Internal Medicine | Admitting: Internal Medicine

## 2016-06-07 DIAGNOSIS — I480 Paroxysmal atrial fibrillation: Secondary | ICD-10-CM | POA: Diagnosis not present

## 2016-06-07 DIAGNOSIS — R079 Chest pain, unspecified: Secondary | ICD-10-CM | POA: Diagnosis present

## 2016-06-07 DIAGNOSIS — I2510 Atherosclerotic heart disease of native coronary artery without angina pectoris: Secondary | ICD-10-CM | POA: Diagnosis not present

## 2016-06-07 DIAGNOSIS — Z79899 Other long term (current) drug therapy: Secondary | ICD-10-CM | POA: Insufficient documentation

## 2016-06-07 DIAGNOSIS — Z794 Long term (current) use of insulin: Secondary | ICD-10-CM | POA: Insufficient documentation

## 2016-06-07 DIAGNOSIS — E039 Hypothyroidism, unspecified: Secondary | ICD-10-CM | POA: Diagnosis not present

## 2016-06-07 DIAGNOSIS — R0602 Shortness of breath: Secondary | ICD-10-CM | POA: Insufficient documentation

## 2016-06-07 DIAGNOSIS — I4891 Unspecified atrial fibrillation: Principal | ICD-10-CM | POA: Insufficient documentation

## 2016-06-07 DIAGNOSIS — E119 Type 2 diabetes mellitus without complications: Secondary | ICD-10-CM | POA: Diagnosis not present

## 2016-06-07 DIAGNOSIS — I10 Essential (primary) hypertension: Secondary | ICD-10-CM | POA: Diagnosis not present

## 2016-06-07 DIAGNOSIS — R0789 Other chest pain: Secondary | ICD-10-CM | POA: Diagnosis not present

## 2016-06-07 LAB — CREATININE, SERUM
CREATININE: 0.98 mg/dL (ref 0.44–1.00)
GFR calc Af Amer: >60 mL/min (ref >60)
GFR, EST NON AFRICAN AMERICAN: 57 mL/min — AB (ref >60)

## 2016-06-07 LAB — BASIC METABOLIC PANEL
Anion gap: 9 (ref 5–15)
BUN: 26 mg/dL — ABNORMAL HIGH (ref 6–20)
CHLORIDE: 106 mmol/L (ref 101–111)
CO2: 24 mmol/L (ref 22–32)
Calcium: 9.7 mg/dL (ref 8.9–10.3)
Creatinine, Ser: 1.03 mg/dL — ABNORMAL HIGH (ref 0.44–1.00)
GFR calc non Af Amer: 54 mL/min — ABNORMAL LOW (ref >60)
Glucose, Bld: 225 mg/dL — ABNORMAL HIGH (ref 65–99)
Potassium: 4 mmol/L (ref 3.5–5.1)
SODIUM: 139 mmol/L (ref 135–145)

## 2016-06-07 LAB — CBC WITH DIFFERENTIAL/PLATELET
BASOS PCT: 0 %
Basophils Absolute: 0 10*3/uL (ref 0.0–0.1)
EOS ABS: 0.2 10*3/uL (ref 0.0–0.7)
Eosinophils Relative: 1 %
HEMATOCRIT: 37 % (ref 36.0–46.0)
HEMOGLOBIN: 12.1 g/dL (ref 12.0–15.0)
Lymphocytes Relative: 20 %
Lymphs Abs: 2.3 10*3/uL (ref 0.7–4.0)
MCH: 30.1 pg (ref 26.0–34.0)
MCHC: 32.7 g/dL (ref 30.0–36.0)
MCV: 92 fL (ref 78.0–100.0)
Monocytes Absolute: 0.7 10*3/uL (ref 0.1–1.0)
Monocytes Relative: 7 %
NEUTROS ABS: 8 10*3/uL — AB (ref 1.7–7.7)
NEUTROS PCT: 72 %
Platelets: 256 10*3/uL (ref 150–400)
RBC: 4.02 MIL/uL (ref 3.87–5.11)
RDW: 13.8 % (ref 11.5–15.5)
WBC: 11.2 10*3/uL — AB (ref 4.0–10.5)

## 2016-06-07 LAB — GLUCOSE, CAPILLARY
Glucose-Capillary: 181 mg/dL — ABNORMAL HIGH (ref 65–99)
Glucose-Capillary: 226 mg/dL — ABNORMAL HIGH (ref 65–99)

## 2016-06-07 LAB — TROPONIN I
Troponin I: <0.03 ng/mL (ref <0.03)
Troponin I: <0.03 ng/mL (ref <0.03)

## 2016-06-07 LAB — CBC
HCT: 35.4 % — ABNORMAL LOW (ref 36.0–46.0)
Hemoglobin: 11.7 g/dL — ABNORMAL LOW (ref 12.0–15.0)
MCH: 30.5 pg (ref 26.0–34.0)
MCHC: 33.1 g/dL (ref 30.0–36.0)
MCV: 92.2 fL (ref 78.0–100.0)
PLATELETS: 235 10*3/uL (ref 150–400)
RBC: 3.84 MIL/uL — AB (ref 3.87–5.11)
RDW: 13.8 % (ref 11.5–15.5)
WBC: 10.6 10*3/uL — AB (ref 4.0–10.5)

## 2016-06-07 LAB — BRAIN NATRIURETIC PEPTIDE: B NATRIURETIC PEPTIDE 5: 155 pg/mL — AB (ref 0.0–100.0)

## 2016-06-07 MED ORDER — INSULIN ASPART PROT & ASPART (70-30 MIX) 100 UNIT/ML ~~LOC~~ SUSP
30.0000 [IU] | Freq: Two times a day (BID) | SUBCUTANEOUS | Status: DC
  Administered 2016-06-07: 30 [IU] via SUBCUTANEOUS
  Filled 2016-06-07: qty 10

## 2016-06-07 MED ORDER — ATORVASTATIN CALCIUM 40 MG PO TABS: 80.0000 mg | ORAL_TABLET | Freq: Every day | ORAL | Status: DC

## 2016-06-07 MED ORDER — LOSARTAN POTASSIUM 50 MG PO TABS
100.0000 mg | ORAL_TABLET | Freq: Every day | ORAL | Status: DC
  Administered 2016-06-07: 100 mg via ORAL
  Filled 2016-06-07: qty 2

## 2016-06-07 MED ORDER — MAGNESIUM OXIDE 400 (241.3 MG) MG PO TABS
400.0000 mg | ORAL_TABLET | Freq: Every day | ORAL | Status: DC
  Administered 2016-06-07: 400 mg via ORAL
  Filled 2016-06-07: qty 1

## 2016-06-07 MED ORDER — AMLODIPINE BESYLATE 5 MG PO TABS
10.0000 mg | ORAL_TABLET | Freq: Every day | ORAL | Status: DC
  Administered 2016-06-07: 10 mg via ORAL
  Filled 2016-06-07: qty 2

## 2016-06-07 MED ORDER — APIXABAN 5 MG PO TABS
5.0000 mg | ORAL_TABLET | Freq: Two times a day (BID) | ORAL | Status: DC
  Administered 2016-06-07: 5 mg via ORAL
  Filled 2016-06-07: qty 1

## 2016-06-07 MED ORDER — ASPIRIN 81 MG PO CHEW
324.0000 mg | CHEWABLE_TABLET | Freq: Once | ORAL | Status: AC
  Administered 2016-06-07: 324 mg via ORAL
  Filled 2016-06-07: qty 4

## 2016-06-07 MED ORDER — CLOPIDOGREL BISULFATE 75 MG PO TABS
75.0000 mg | ORAL_TABLET | Freq: Every day | ORAL | Status: DC
  Administered 2016-06-07: 75 mg via ORAL
  Filled 2016-06-07: qty 1

## 2016-06-07 MED ORDER — ONDANSETRON HCL 4 MG/2ML IJ SOLN: 4.0000 mg | Freq: Four times a day (QID) | INTRAMUSCULAR | Status: DC | PRN

## 2016-06-07 MED ORDER — NITROGLYCERIN 0.4 MG SL SUBL
0.4000 mg | SUBLINGUAL_TABLET | SUBLINGUAL | Status: DC | PRN
  Administered 2016-06-07: 0.4 mg via SUBLINGUAL
  Filled 2016-06-07: qty 1

## 2016-06-07 MED ORDER — ROPINIROLE HCL 1 MG PO TABS: 0.5000 mg | ORAL_TABLET | Freq: Every day | ORAL | Status: DC

## 2016-06-07 MED ORDER — LABETALOL HCL 200 MG PO TABS
400.0000 mg | ORAL_TABLET | Freq: Two times a day (BID) | ORAL | Status: DC
  Administered 2016-06-07: 400 mg via ORAL
  Filled 2016-06-07: qty 2

## 2016-06-07 MED ORDER — FUROSEMIDE 40 MG PO TABS
40.0000 mg | ORAL_TABLET | Freq: Two times a day (BID) | ORAL | Status: DC
  Administered 2016-06-07: 40 mg via ORAL
  Filled 2016-06-07: qty 1

## 2016-06-07 MED ORDER — INSULIN ASPART 100 UNIT/ML ~~LOC~~ SOLN
0.0000 [IU] | Freq: Three times a day (TID) | SUBCUTANEOUS | Status: DC
  Administered 2016-06-07: 2 [IU] via SUBCUTANEOUS

## 2016-06-07 MED ORDER — OMEGA-3-ACID ETHYL ESTERS 1 G PO CAPS
2.0000 | ORAL_CAPSULE | Freq: Two times a day (BID) | ORAL | Status: DC
  Administered 2016-06-07: 2 g via ORAL
  Filled 2016-06-07: qty 2

## 2016-06-07 MED ORDER — LEVOTHYROXINE SODIUM 75 MCG PO TABS
150.0000 ug | ORAL_TABLET | Freq: Every day | ORAL | Status: DC
  Administered 2016-06-07: 150 ug via ORAL
  Filled 2016-06-07: qty 2

## 2016-06-07 MED ORDER — POTASSIUM CHLORIDE CRYS ER 20 MEQ PO TBCR
20.0000 meq | EXTENDED_RELEASE_TABLET | Freq: Every day | ORAL | Status: DC
  Administered 2016-06-07: 20 meq via ORAL
  Filled 2016-06-07: qty 1

## 2016-06-07 MED ORDER — ACETAMINOPHEN 325 MG PO TABS: 650.0000 mg | ORAL_TABLET | ORAL | Status: DC | PRN

## 2016-06-07 NOTE — Discharge Instructions (Signed)
Continue taking oral Cardizem for A. fib as needed unchanged as before.    Follow with Primary MD Purvis Kilts, MD in 7 days   Get CBC, CMP, 2 view Chest X ray checked  by Primary MD or SNF MD in 5-7 days ( we routinely change or add medications that can affect your baseline labs and fluid status, therefore we recommend that you get the mentioned basic workup next visit with your PCP, your PCP may decide not to get them or add new tests based on their clinical decision)   Activity: As tolerated with Full fall precautions use walker/cane & assistance as needed   Disposition Home    Diet:   Heart Healthy   For Heart failure patients - Check your Weight same time everyday, if you gain over 2 pounds, or you develop in leg swelling, experience more shortness of breath or chest pain, call your Primary MD immediately. Follow Cardiac Low Salt Diet and 1.5 lit/day fluid restriction.   On your next visit with your primary care physician please Get Medicines reviewed and adjusted.   Please request your Prim.MD to go over all Hospital Tests and Procedure/Radiological results at the follow up, please get all Hospital records sent to your Prim MD by signing hospital release before you go home.   If you experience worsening of your admission symptoms, develop shortness of breath, life threatening emergency, suicidal or homicidal thoughts you must seek medical attention immediately by calling 911 or calling your MD immediately  if symptoms less severe.  You Must read complete instructions/literature along with all the possible adverse reactions/side effects for all the Medicines you take and that have been prescribed to you. Take any new Medicines after you have completely understood and accpet all the possible adverse reactions/side effects.   Do not drive, operate heavy machinery, perform activities at heights, swimming or participation in water activities or provide baby sitting services if  your were admitted for syncope or siezures until you have seen by Primary MD or a Neurologist and advised to do so again.  Do not drive when taking Pain medications.    Do not take more than prescribed Pain, Sleep and Anxiety Medications  Special Instructions: If you have smoked or chewed Tobacco  in the last 2 yrs please stop smoking, stop any regular Alcohol  and or any Recreational drug use.  Wear Seat belts while driving.   Please note  You were cared for by a hospitalist during your hospital stay. If you have any questions about your discharge medications or the care you received while you were in the hospital after you are discharged, you can call the unit and asked to speak with the hospitalist on call if the hospitalist that took care of you is not available. Once you are discharged, your primary care physician will handle any further medical issues. Please note that NO REFILLS for any discharge medications will be authorized once you are discharged, as it is imperative that you return to your primary care physician (or establish a relationship with a primary care physician if you do not have one) for your aftercare needs so that they can reassess your need for medications and monitor your lab values.

## 2016-06-07 NOTE — H&P (Signed)
TRH H&P    Patient Demographics:    Virginia Huber, is a 70 y.o. female  MRN: [id number]  DOB - 1945/07/13  Admit Date - 06/07/2016  Referring MD/NP/PA: Dr Leonides Schanz  Outpatient Primary MD for the patient is Purvis Kilts, MD  Patient coming from: Home  Chief Complaint  Patient presents with  . Chest Pain      HPI:    Virginia Huber  is a 70 y.o. female, With history of A. fib on eliquis, coronary artery disease on Plavix, hypertension, diabetes mellitus who came to the hospital after patient started having chest pain associated with palpitations. Patient says that usually she gets into A. fib and gets chest pain with that. She has been given diltiazem by her cardiologist to take on a when necessary basis. Last night patient went into A. fib and started having chest pain at that time she took 1 sublingual nitroglycerin with no relief and 30 mg diltiazem. Patient says that the palpitations improved but she continued to have chest pain so she came to the hospital. The pain was radiating to left shoulder. She denies nausea vomiting but had associated shortness of breath. No abdominal pain no fever or chills. No dysuria.  Patient had left heart cath in 08/02/2015 which showed significant obstructive disease with diffuse mild luminal narrowing of LAD with 40% proximal and mid stenosis. She had placement of drug-eluting stent to distal RCA.  In the ED cardiac enzymes are negative. Patient's chest pain has resolved. And she is back in normal sinus rhythm    Review of systems:    In addition to the HPI above,  No Fever-chills, No Headache, No changes with Vision or hearing, No problems swallowing food or Liquids, No Abdominal pain, No Nausea or Vomiting, bowel movements are regular, No Blood in stool or Urine, No dysuria, No new skin rashes or bruises, No new joints pains-aches,  No new weakness, tingling,  numbness in any extremity, No recent weight gain or loss, No polyuria, polydypsia or polyphagia, No significant Mental Stressors.  A full 10 point Review of Systems was done, except as stated above, all other Review of Systems were negative.   With Past History of the following :    Past Medical History:  Diagnosis Date  . Arthritis   . Atrial fibrillation and flutter ([place])    a. h/o PAF/flutter during admission in 2013 for PNA. b. PAF during adm for NSTEMI 07/2015.  [name] B12 deficiency anemia   . Coronary artery disease 11/30/2014   a. remote MI. b. h/o PTCA with scoring balloon to OM1 11/2014. c. NSTEMI 03/2015 s/p DES to prox-mid Cx. d. NSTEMI 07/2015 s/p scoring balloon/PTCA/DES to dRCA with PAF during that admission  . Cutaneous lupus erythematosus   . GERD (gastroesophageal reflux disease)   . Heart block   . History of blood transfusion 1980's   2nd surgical procedures  . HTN (hypertension)   . Hypercholesteremia   . Hypothyroidism   . Myocardial infarction 02/2012  . Ovarian tumor   . PAD (  peripheral artery disease) ([place])    a. s/p LE angio 2015; followed by Dr. Fletcher Anon - managed medically.  . Pain with urination 05/08/2015  . Superficial fungus infection of skin 06/29/2013  . TIA (transient ischemic attack) 08/2001; ~ 2006  . Type II diabetes mellitus ([place])   . UTI (urinary tract infection) 05/08/2013      Past Surgical History:  Procedure Laterality Date  . ABDOMINAL AORTAGRAM N/A 01/03/2014   Procedure: ABDOMINAL [name];  Surgeon: Wellington Hampshire, MD;  Location: [facility];  Service: Cardiovascular;  Laterality: N/A;  . ABDOMINAL HYSTERECTOMY  1972   "partial"  . APPENDECTOMY  1970's  . CARDIAC CATHETERIZATION  2008   Tiny OM-2 with 90% narrowing. Med tx.  [name] CARDIAC CATHETERIZATION N/A 11/30/2014   Procedure: Left Heart Cath and Coronary Angiography;  Surgeon: Troy Sine, MD; LAD 20%, CFX 50%, OM1 95%, right PLB 30%, LV normal   . CARDIAC CATHETERIZATION N/A  11/30/2014   Procedure: Coronary Balloon Angioplasty;  Surgeon: Troy Sine, MD;  Angiosculpt scoring balloon and PTCA to the OM1 reducing stenosis from 95% to less than 10%  . CARDIAC CATHETERIZATION N/A 04/03/2015   Procedure: Left Heart Cath and Coronary Angiography;  Surgeon: Jolaine Artist, MD; dLAD 50%, CFX 90%, OM1 100%, PLA 15%, LVEDP 13    . CARDIAC CATHETERIZATION N/A 04/03/2015   Procedure: Coronary Stent Intervention;  Surgeon: Sherren Mocha, MD; 3.0x18 mm Xience DES to the CFX    . CARDIAC CATHETERIZATION N/A 08/02/2015   Procedure: Left Heart Cath and Coronary Angiography;  Surgeon: Troy Sine, MD;  Location: [facility];  Service: Cardiovascular;  Laterality: N/A;  . CARDIAC CATHETERIZATION N/A 08/02/2015   Procedure: Coronary Stent Intervention;  Surgeon: Troy Sine, MD;  Location: [facility];  Service: Cardiovascular;  Laterality: N/A;  . cardiac stents    . CHOLECYSTECTOMY OPEN  1990's  . COLONOSCOPY  2005   Dr. Laural Golden: pancolonic divericula, polyp, path unknown currently  . COLONOSCOPY  2012   Dr. Oneida Alar: Normal TI, scattered diverticula in entire colon, small internal hemorrhoids, normal colon biopsies. Colonoscopy in 5-10 years.   . COLOSTOMY  05/1979  . COLOSTOMY REVERSAL  11/1979  . EXCISIONAL HEMORRHOIDECTOMY  1970's  . EYE SURGERY Left 2000   "branch vein occlusion"  . EYE SURGERY Left ~ 2001   "smoothed out wrinkle"  . LEFT OOPHORECTOMY  05/1979   nicked bowel, peritonitis, colostomy; colostomy reversed 1981   . LOWER EXTREMITY ANGIOGRAM N/A 01/03/2014   Procedure: LOWER EXTREMITY ANGIOGRAM;  Surgeon: Wellington Hampshire, MD;  Location: [place] [facility];  Service: Cardiovascular;  Laterality: N/A;  . Nuclear med stress test  10/2011   Small area of mild ischemia inferoapically.  [name] PARTIAL HYSTERECTOMY  1970's   left ovaries, then ovaries removed later due tumors   . RIGHT OOPHORECTOMY  1970's      Social History:      Social History    Substance Use Topics  . Smoking status: Never Smoker  . Smokeless tobacco: Never Used     Comment: Never smoked  . Alcohol use No       Family History :     Family History  Problem Relation Age of Onset  . Heart disease Mother     deceased  . Heart disease Father     deceased, heart disease  . Diabetes Brother   . Heart disease Brother   . Thyroid disease Brother   .  Heart disease Sister   . Heart disease Brother   . Thyroid disease Brother   . Lupus Daughter   . Colon cancer Neg Hx       Home Medications:   Prior to Admission medications   Medication Sig Start Date End Date Taking? Authorizing Provider  amLODipine (NORVASC) 10 MG tablet TAKE ONE (1) TABLET EACH DAY 07/26/15   Herminio Commons, MD  apixaban (ELIQUIS) 5 MG TABS tablet Take 1 tablet (5 mg total) by mouth 2 (two) times daily. 12/16/15   Herminio Commons, MD  atorvastatin (LIPITOR) 80 MG tablet Take 1 tablet (80 mg total) by mouth daily at 6 PM. 04/27/16   Herminio Commons, MD  cholecalciferol (VITAMIN D) 1000 UNITS tablet Take 1,000 Units by mouth at bedtime.     Historical Provider, MD  clopidogrel (PLAVIX) 75 MG tablet Take 1 tablet (75 mg total) by mouth daily. 08/09/15   Dayna N Dunn, PA-C  furosemide (LASIX) 20 MG tablet Take 2 tablets (40 mg total) by mouth 2 (two) times daily. 02/20/16   Herminio Commons, MD  insulin NPH-regular Human (NOVOLIN 70/30) (70-30) 100 UNIT/ML injection Inject 30-40 Units into the skin 2 (two) times daily with a meal. Inject 30 units with breakfast and 40 units with supper only if pre-meal blood glucose is above 90 mg/dL.    Historical Provider, MD  labetalol (NORMODYNE) 200 MG tablet Take 2 tablets (400 mg total) by mouth 2 (two) times daily. 12/30/15   Herminio Commons, MD  levothyroxine (SYNTHROID, LEVOTHROID) 150 MCG tablet Take 150 mcg by mouth daily before breakfast.    Historical Provider, MD  losartan (COZAAR) 100 MG tablet TAKE ONE (1) TABLET BY MOUTH EVERY  DAY 06/10/15   Herminio Commons, MD  magnesium oxide (MAG-OX) 400 MG tablet Take 400 mg by mouth daily.    Historical Provider, MD  metFORMIN (GLUCOPHAGE) 500 MG tablet Take 500 mg by mouth 2 (two) times daily with a meal.    Historical Provider, MD  nitroGLYCERIN (NITROSTAT) 0.4 MG SL tablet Place 1 tablet (0.4 mg total) under the tongue every 5 (five) minutes as needed for chest pain. 04/04/15   Evelene Croon Barrett, PA-C  Omega-3 Fatty Acids (FISH OIL) 1200 MG CAPS Take 2 capsules (2,400 mg total) by mouth 2 (two) times daily. 04/27/16   Cassandria Anger, MD  potassium chloride SA (KLOR-CON M20) 20 MEQ tablet Take 1 tablet (20 mEq total) by mouth daily. 10/02/15   Isaiah Serge, NP  rOPINIRole (REQUIP) 0.5 MG tablet Take 0.5 mg by mouth at bedtime.  03/05/14   Historical Provider, MD  sodium chloride (OCEAN) 0.65 % SOLN nasal spray Place 1 spray into both nostrils as needed for congestion. 05/13/16   Asencion Partridge Dohmeier, MD     Allergies:     Penicillin   Physical Exam:   Vitals  Blood pressure 147/66, pulse 78, temperature 98.1 F (36.7 C), temperature source Oral, resp. rate 21, height 5\' 3"  (1.6 m), weight 67.1 kg (148 lb), SpO2 94 %.  1.  General: Appears in no acute distress  2. Psychiatric:  Intact judgement and  insight, awake alert, oriented x 3.  3. Neurologic: No focal neurological deficits, all cranial nerves intact.Strength 5/5 all 4 extremities, sensation intact all 4 extremities, plantars down going.  4. Eyes :  anicteric sclerae, moist conjunctivae with no lid lag. PERRLA.  5. ENMT:  Oropharynx clear with moist mucous membranes and good dentition  6. Neck:  supple, no cervical lymphadenopathy appriciated, No thyromegaly  7. Respiratory : Normal respiratory effort, good air movement bilaterally,clear to  auscultation bilaterally  8. Cardiovascular : RRR, no gallops, rubs or murmurs, no leg edema  9. Gastrointestinal:  Positive bowel sounds, abdomen soft,  non-tender to palpation,no hepatosplenomegaly, no rigidity or guarding       10. Skin:  No cyanosis, normal texture and turgor, no rash, lesions or ulcers  11.Musculoskeletal:  Good muscle tone,  joints appear normal , no effusions,  normal range of motion    Data Review:    CBC  Recent Labs Lab 06/07/16 0039  WBC 11.2*  HGB 12.1  HCT 37.0  PLT 256  MCV 92.0  MCH 30.1  MCHC 32.7  RDW 13.8  LYMPHSABS 2.3  MONOABS 0.7  EOSABS 0.2  BASOSABS 0.0   ------------------------------------------------------------------------------------------------------------------  Chemistries   Recent Labs Lab 06/07/16 0039  NA 139  K 4.0  CL 106  CO2 24  GLUCOSE 225*  BUN 26*  CREATININE 1.03*  CALCIUM 9.7   ------------------------------------------------------------------------------------------------------------------  Cardiac Enzymes:  Recent Labs Lab 06/07/16 0039  TROPONINI <0.03       Imaging Results:    Dg Chest 2 View  Result Date: 06/07/2016 CLINICAL DATA:  Initial evaluation for acute chest pain, shortness of breath. EXAM: CHEST  2 VIEW COMPARISON:  Prior radiograph from 10/28/2015. FINDINGS: The cardiac and mediastinal silhouettes are stable in size and contour, and remain within normal limits. Mild elevation the right hemidiaphragm, stable. No airspace consolidation, pleural effusion, or pulmonary edema is identified. There is no pneumothorax. No acute osseous abnormality identified. IMPRESSION: No active cardiopulmonary disease. Electronically Signed   By: Jeannine Boga M.D.   On: 06/07/2016 02:05    My personal review of EKG: Rhythm NSR   Assessment & Plan:    Active Problems:   CAD, NATIVE VESSEL   Paroxysmal atrial fibrillation with RVR (HCC)   Chest pain   1. Chest pain- we'll place under observation, obtain serial cardiac enzymes. Chest pain has resolved. Will closely monitor patient on telemetry as she has history of A.  Fib. 2. Paroxysmal A. Fib- CHA2DS2VASc score is 6 , patient is back in normal sinus rhythm, she is on eliquis for anticoagulation. Continue labetalol 400 mg twice a day. 3. Hypothyroidism-continue Synthroid. 4. Diabetes mellitus-continue insulin 70/30, 30 units twice a day. Hold metformin, start sliding scale insulin with NovoLog. 5. Hypertension- continue labetalol, Norvasc, losartan.   DVT Prophylaxis-   Patient on Eliquis  AM Labs Ordered, also please review Full Orders  Family Communication: Admission, patients condition and plan of care including tests being ordered have been discussed with the patient and her husband at bedside who indicate understanding and agree with the plan and Code Status.  Code Status:  Full code  Admission status: Observation  Time spent in minutes : *60 minutes   Sirenia Whitis S M.D on 06/07/2016 at 2:52 AM  Between 7am to 7pm - Pager - [phone]. After 7pm go to [email or web address] - password [facility]  [facility] - Office  [phone]

## 2016-06-07 NOTE — ED Provider Notes (Signed)
By signing my name below, I, Georgette Shell, attest that this documentation has been prepared under the direction and in the presence of [place], DO. Electronically Signed: Georgette Shell, ED Scribe. 06/07/16. 12:43 AM.  TIME SEEN: 12:34 AM  CHIEF COMPLAINT:  Chief Complaint  Patient presents with  . Chest Pain   HPI:  Virginia Huber is a 70 y.o. female with h/o A-fib on Eliquis, CAD on Plavix, HTN, TIA and DM who presents to the Emergency Department complaining of tight, Pressure-like, central chest pain onset ~12 am today. Pain initially radiated down her left arm but no radiation currently. Pt has h/o A-fib and notes that her pain at this time feels similar. She has taken 1 SL nitro with no relief, and 30mg  Diltiazem. She has been compliant with her medications. Patient has had associated shortness of breath and recent dry cough. Denies nausea, vomiting, diarrhea, hematochezia, melena, diaphoresis, fever, or any other associated symptoms.  States this is the first time that she has taken diltiazem as needed for her atrial fibrillation. While in the room patient converted back to a sinus rhythm. Pain improved but did not completely resolve.  LHC 08/02/15:  Conclusion    1st RPLB lesion, 50% stenosed.  Dist RCA lesion, 30% stenosed.  Ost 1st Mrg to 1st Mrg lesion, 100% stenosed.  Ost LAD lesion, 40% stenosed.  Mid LAD lesion, 40% stenosed.  Post Atrio lesion, 90% stenosed. Post intervention, there is a 0% residual stenosis.  The left ventricular systolic function is normal.  Mid RCA lesion, 20% stenosed.   Normal LV function without focal segmental wall motion abnormalities and ejection fraction of 55%.  Significant coronary obstructive disease with diffuse mild luminal narrowing of the LAD with 40% proximal and mid stenoses; widely patent stent in the proximal circumflex with old occlusion of the marginal branch which had arisen in the region of the stented segment but with  evidence for very faint collateralization to this distal marginal vessel from the the LAD; a large dominant RCA with 20%, mild mid narrowing, 30% narrowing after the acute margin and focal 90% stenosis distally prior to a PDA 2 vessel with 50% narrowing at the ostium of this PDA prior to a bend in the vessel with the RCA ending in the PLA vessel.  Successful percutaneous cardiac intervention to the distal RCA treated with Angiosculpt scoring balloon, PTCA, and ultimate stenting with a 2.58 mm Xience Alpine DES stent postdilated to 2.51 mm with a 90% stenosis reduced to 0% and no change in the ostial PDA narrowing.  RECOMMENDATION: Since the patient suffered a non-ST segment elevation myocardial infarction on Plavix, Brilinta was administered for oral antiplatelet therapy.  It was felt most likely the patient's atrial fibrillation was ischemic mediated.  If anticoagulation is necessary, Brilinta will need to be discontinued and changed back to Plavix due to potential bleed risk if  triple therapy is needed short-term.      ROS: See HPI Constitutional: no fever  Eyes: no drainage  ENT: no runny nose   Cardiovascular:  chest pain  Resp: no SOB  GI: no vomiting GU: no dysuria Integumentary: no rash  Allergy: no hives  Musculoskeletal: no leg swelling  Neurological: no slurred speech ROS otherwise negative  PAST MEDICAL HISTORY/PAST SURGICAL HISTORY:  Past Medical History:  Diagnosis Date  . Arthritis   . Atrial fibrillation and flutter ([place])    a. h/o PAF/flutter during admission in 2013 for PNA. b. PAF during adm for  NSTEMI 07/2015.  [name] B12 deficiency anemia   . Coronary artery disease 11/30/2014   a. remote MI. b. h/o PTCA with scoring balloon to OM1 11/2014. c. NSTEMI 03/2015 s/p DES to prox-mid Cx. d. NSTEMI 07/2015 s/p scoring balloon/PTCA/DES to dRCA with PAF during that admission  . Cutaneous lupus erythematosus   . GERD (gastroesophageal reflux disease)   . Heart block   .  History of blood transfusion 1980's   2nd surgical procedures  . HTN (hypertension)   . Hypercholesteremia   . Hypothyroidism   . Myocardial infarction 02/2012  . Ovarian tumor   . PAD (peripheral artery disease) ([place])    a. s/p LE angio 2015; followed by Dr. Fletcher Anon - managed medically.  . Pain with urination 05/08/2015  . Superficial fungus infection of skin 06/29/2013  . TIA (transient ischemic attack) 08/2001; ~ 2006  . Type II diabetes mellitus ([place])   . UTI (urinary tract infection) 05/08/2013    MEDICATIONS:  Prior to Admission medications   Medication Sig Start Date End Date Taking? Authorizing Provider  amLODipine (NORVASC) 10 MG tablet TAKE ONE (1) TABLET EACH DAY 07/26/15   Herminio Commons, MD  apixaban (ELIQUIS) 5 MG TABS tablet Take 1 tablet (5 mg total) by mouth 2 (two) times daily. 12/16/15   Herminio Commons, MD  atorvastatin (LIPITOR) 80 MG tablet Take 1 tablet (80 mg total) by mouth daily at 6 PM. 04/27/16   Herminio Commons, MD  cholecalciferol (VITAMIN D) 1000 UNITS tablet Take 1,000 Units by mouth at bedtime.     Historical Provider, MD  clopidogrel (PLAVIX) 75 MG tablet Take 1 tablet (75 mg total) by mouth daily. 08/09/15   Dayna N Dunn, PA-C  furosemide (LASIX) 20 MG tablet Take 2 tablets (40 mg total) by mouth 2 (two) times daily. 02/20/16   Herminio Commons, MD  insulin NPH-regular Human (NOVOLIN 70/30) (70-30) 100 UNIT/ML injection Inject 30-40 Units into the skin 2 (two) times daily with a meal. Inject 30 units with breakfast and 40 units with supper only if pre-meal blood glucose is above 90 mg/dL.    Historical Provider, MD  labetalol (NORMODYNE) 200 MG tablet Take 2 tablets (400 mg total) by mouth 2 (two) times daily. 12/30/15   Herminio Commons, MD  levothyroxine (SYNTHROID, LEVOTHROID) 150 MCG tablet Take 150 mcg by mouth daily before breakfast.    Historical Provider, MD  losartan (COZAAR) 100 MG tablet TAKE ONE (1) TABLET BY MOUTH EVERY DAY 06/10/15    Herminio Commons, MD  magnesium oxide (MAG-OX) 400 MG tablet Take 400 mg by mouth daily.    Historical Provider, MD  metFORMIN (GLUCOPHAGE) 500 MG tablet Take 500 mg by mouth 2 (two) times daily with a meal.    Historical Provider, MD  nitroGLYCERIN (NITROSTAT) 0.4 MG SL tablet Place 1 tablet (0.4 mg total) under the tongue every 5 (five) minutes as needed for chest pain. 04/04/15   Evelene Croon Barrett, PA-C  Omega-3 Fatty Acids (FISH OIL) 1200 MG CAPS Take 2 capsules (2,400 mg total) by mouth 2 (two) times daily. 04/27/16   Cassandria Anger, MD  potassium chloride SA (KLOR-CON M20) 20 MEQ tablet Take 1 tablet (20 mEq total) by mouth daily. 10/02/15   Isaiah Serge, NP  rOPINIRole (REQUIP) 0.5 MG tablet Take 0.5 mg by mouth at bedtime.  03/05/14   Historical Provider, MD  sodium chloride (OCEAN) 0.65 % SOLN nasal spray Place 1 spray into both nostrils  as needed for congestion. 05/13/16   Larey Seat, MD    ALLERGIES:  Allergies  Allergen Reactions  . Penicillins Hives    Has patient had a PCN reaction causing immediate rash, facial/tongue/throat swelling, SOB or lightheadedness with hypotension: No Has patient had a PCN reaction causing severe rash involving mucus membranes or skin necrosis: No Has patient had a PCN reaction that required hospitalization No Has patient had a PCN reaction occurring within the last 10 years: No If all of the above answers are "NO", then may proceed with Cephalosporin use.  [name] Percocet [Oxycodone-Acetaminophen] Nausea And Vomiting    SOCIAL HISTORY:  Social History  Substance Use Topics  . Smoking status: Never Smoker  . Smokeless tobacco: Never Used     Comment: Never smoked  . Alcohol use No    FAMILY HISTORY: Family History  Problem Relation Age of Onset  . Heart disease Mother     deceased  . Heart disease Father     deceased, heart disease  . Diabetes Brother   . Heart disease Brother   . Thyroid disease Brother   . Heart disease  Sister   . Heart disease Brother   . Thyroid disease Brother   . Lupus Daughter   . Colon cancer Neg Hx     EXAM: BP 131/78 (BP Location: Left Arm)   Pulse (!) 129   Temp 98.1 F (36.7 C) (Oral)   Resp 17   Ht 5\' 3"  (1.6 m)   Wt 148 lb (67.1 kg)   SpO2 98%   BMI 26.22 kg/m  CONSTITUTIONAL: Alert and oriented and responds appropriately to questions. Well-appearing;  well-nourished. Elderly. HEAD: Normocephalic EYES: Conjunctivae clear, PERRL, EOMI ENT: normal nose; no rhinorrhea; moist mucous membranes NECK: Supple, no meningismus, no nuchal rigidity, no LAD  CARD: Initially tachycardic and irregularly irregular and then patient became RRR; S1 and S2 appreciated; no murmurs, no clicks, no rubs, no gallops RESP: Normal chest excursion without splinting or tachypnea; breath sounds clear and equal bilaterally; no wheezes, no rhonchi, no rales, no hypoxia or respiratory distress, speaking full sentences ABD/GI: Normal bowel sounds; non-distended; soft, non-tender, no rebound, no guarding, no peritoneal signs, no hepatosplenomegaly BACK:  The back appears normal and is non-tender to palpation, there is no CVA tenderness EXT: Normal ROM in all joints; non-tender to palpation; no edema; normal capillary refill; no cyanosis, no calf tenderness or swelling    SKIN: Normal color for age and race; warm; no rash NEURO: Moves all extremities equally, sensation to light touch intact diffusely, cranial nerves II through XII intact, normal speech PSYCH: The patient's mood and manner are appropriate. Grooming and personal hygiene are appropriate.  MEDICAL DECISION MAKING: Patient here with chest pain that radiated down her left arm, shortness of breath. Symptoms similar to when she has had atrial fibrillation with RVR. She initially in atrial fibrillation with RVR while talking to her she converts to sinus rhythm with a heart rate in the 80s. She did take a 30 mg diltiazem tablet prior to arrival.  Chest pain however did not completely resolved. She still having some pressure. Will give aspirin, nitroglycerin. Does have a significant history of coronary artery disease with last cath in February. See report above. Will obtain cardiac labs, chest x-ray but I feel she will likely need to be admitted for observation for a chest pain rule out. Patient is comfortable with this plan. PCP is Dr. [name].  Cardiologist is Dr. Bronson Ing.  ED PROGRESS:  1:30 AM  Pt pain-free after 1 nitroglycerin tablet.   2:05 AM  Pt's labs are unremarkable. Troponin negative. Chest x-ray clear. She is still in a normal sinus rhythm and chest pain-free. Will discuss with medicine for admission for chest pain rule out.  2:20 AM  Discussed patient's case with hospitalist, Dr. Darrick Meigs.  Recommend admission to tele, obs bed.  I will place holding orders per their request. Patient and family (if present) updated with plan. Care transferred to hospitalist service.  I reviewed all nursing notes, vitals, pertinent old records, EKGs, labs, imaging (as available).    EKG Interpretation  Date/Time:  Sunday June 07 2016 00:28:50 EST Ventricular Rate:  134 PR Interval:    QRS Duration: 92 QT Interval:  317 QTC Calculation: 474 R Axis:   -12 Text Interpretation:  Atrial fibrillation LVH with secondary repolarization abnormality Baseline wander in lead(s) V1 Confirmed by WARD,  DO, KRISTEN [id number]) on 06/07/2016 12:31:45 AM        EKG Interpretation  Date/Time:  Sunday June 07 2016 00:37:27 EST Ventricular Rate:  78 PR Interval:    QRS Duration: 98 QT Interval:  401 QTC Calculation: 457 R Axis:   -3 Text Interpretation:  Sinus rhythm LVH with secondary repolarization abnormality No significant change since last tracing in 2001 Confirmed by WARD,  DO, KRISTEN [phone]) on 06/07/2016 12:48:18 AM          I personally performed the services described in this documentation, which was scribed in my presence. The  recorded information has been reviewed and is accurate.    [place], DO 06/07/16 0221

## 2016-06-07 NOTE — Discharge Summary (Signed)
MERELIN HUMAN GQB:[id number] DOB: 08/07/1945 DOA: 06/07/2016  PCP: Purvis Kilts, MD  Admit date: 06/07/2016  Discharge date: 06/07/2016  Admitted From: Home   Disposition:  Home   Recommendations for Outpatient Follow-up:   Follow up with PCP in 1-2 weeks  PCP Please obtain BMP/CBC, 2 view CXR in 1week,  (see Discharge instructions)   PCP Please follow up on the following pending results: None   Home Health: None   Equipment/Devices: None  Consultations: None Discharge Condition: Stable   CODE STATUS: Full   Diet Recommendation:  Heart Healthy    Chief Complaint  Patient presents with  . Chest Pain     Brief history of present illness from the day of admission and additional interim summary      Virginia Huber  is a 70 y.o. female, With history of A. fib on eliquis, coronary artery disease on Plavix, hypertension, diabetes mellitus who came to the hospital after patient started having chest pain associated with palpitations.   In the ER she was found to be in A. fib with RVR, she had already taken her when necessary Cardizem before coming in soon thereafter she converted into sinus rhythm, her pain was atypical and lasted 15-20 minutes maximum. It did not radiate to her left arm or jaw, I did not make her short of breath or sweats, no nausea. Once in sinus rhythm and rate controlled she is completely symptom free.  Patient had left heart cath in 08/02/2015 which showed significant obstructive disease with diffuse mild luminal narrowing of LAD with 40% proximal and mid stenosis. She had placement of drug-eluting stent to distal RCA.   Hospital issues addressed     1. Chest pain- Brought on by paroxysmal A. fib, when she came to the ER her heart rate was in mid 130s, her chest pain lasted for  15-20 minutes, per patient she is completely pain-free, I ambulated her in the hallway for over 200 feet without any chest discomfort. She is eager to go home and currently in sinus, she will be discharged home on her home medications which include when necessary oral Cardizem, her EKG now is nonacute and troponin negative, she is completely symptom free, she will follow with PCP and a primary cardiologist tomorrow. 2. Paroxysmal A. Fib- CHA2DS2VASc score is 6 , patient is back in normal sinus rhythm, she is on eliquis for anticoagulation. Continue labetalol 400 mg twice a day. With as needed oral Cardizem. 3. Hypothyroidism-continue Synthroid. 4. Diabetes mellitus-continue home regimen. 5. Hypertension- continue labetalol, Norvasc, losartan.   Discharge diagnosis     Active Problems:   CAD, NATIVE VESSEL   Paroxysmal atrial fibrillation with RVR (H[facility])   Chest pain    Discharge instructions    Discharge Instructions    Diet - low sodium heart healthy    Complete by:  As directed    Discharge instructions    Complete by:  As directed    Continue taking oral Cardizem for A. fib as needed unchanged as  before.   Follow with Primary MD Purvis Kilts, MD in 7 days   Get CBC, CMP, 2 view Chest X ray checked  by Primary MD or SNF MD in 5-7 days ( we routinely change or add medications that can affect your baseline labs and fluid status, therefore we recommend that you get the mentioned basic workup next visit with your PCP, your PCP may decide not to get them or add new tests based on their clinical decision)   Activity: As tolerated with Full fall precautions use walker/cane & assistance as needed   Disposition Home    Diet:   Heart Healthy   For Heart failure patients - Check your Weight same time everyday, if you gain over 2 pounds, or you develop in leg swelling, experience more shortness of breath or chest pain, call your Primary MD immediately. Follow Cardiac Low Salt Diet  and 1.5 lit/day fluid restriction.   On your next visit with your primary care physician please Get Medicines reviewed and adjusted.   Please request your Prim.MD to go over all Hospital Tests and Procedure/Radiological results at the follow up, please get all Hospital records sent to your Prim MD by signing hospital release before you go home.   If you experience worsening of your admission symptoms, develop shortness of breath, life threatening emergency, suicidal or homicidal thoughts you must seek medical attention immediately by calling 911 or calling your MD immediately  if symptoms less severe.  You Must read complete instructions/literature along with all the possible adverse reactions/side effects for all the Medicines you take and that have been prescribed to you. Take any new Medicines after you have completely understood and accpet all the possible adverse reactions/side effects.   Do not drive, operate heavy machinery, perform activities at heights, swimming or participation in water activities or provide baby sitting services if your were admitted for syncope or siezures until you have seen by Primary MD or a Neurologist and advised to do so again.  Do not drive when taking Pain medications.    Do not take more than prescribed Pain, Sleep and Anxiety Medications  Special Instructions: If you have smoked or chewed Tobacco  in the last 2 yrs please stop smoking, stop any regular Alcohol  and or any Recreational drug use.  Wear Seat belts while driving.   Please note  You were cared for by a hospitalist during your hospital stay. If you have any questions about your discharge medications or the care you received while you were in the hospital after you are discharged, you can call the unit and asked to speak with the hospitalist on call if the hospitalist that took care of you is not available. Once you are discharged, your primary care physician will handle any further medical  issues. Please note that NO REFILLS for any discharge medications will be authorized once you are discharged, as it is imperative that you return to your primary care physician (or establish a relationship with a primary care physician if you do not have one) for your aftercare needs so that they can reassess your need for medications and monitor your lab values.   Increase activity slowly    Complete by:  As directed        Discharge Medications         Medication List    TAKE these medications   amLODipine 10 MG tablet Commonly known as:  NORVASC TAKE ONE (1) TABLET EACH DAY  apixaban 5 MG Tabs tablet Commonly known as:  ELIQUIS Take 1 tablet (5 mg total) by mouth 2 (two) times daily.   atorvastatin 80 MG tablet Commonly known as:  LIPITOR Take 1 tablet (80 mg total) by mouth daily at 6 PM.   cholecalciferol 1000 units tablet Commonly known as:  VITAMIN D Take 1,000 Units by mouth at bedtime.   clopidogrel 75 MG tablet Commonly known as:  PLAVIX Take 1 tablet (75 mg total) by mouth daily.   Fish Oil 1200 MG Caps Take 2 capsules (2,400 mg total) by mouth 2 (two) times daily.   furosemide 20 MG tablet Commonly known as:  LASIX Take 2 tablets (40 mg total) by mouth 2 (two) times daily.   insulin NPH-regular Human (70-30) 100 UNIT/ML injection Commonly known as:  NOVOLIN 70/30 Inject 30-40 Units into the skin 2 (two) times daily with a meal. Inject 30 units with breakfast and 40 units with supper only if pre-meal blood glucose is above 90 mg/dL.   labetalol 200 MG tablet Commonly known as:  NORMODYNE Take 2 tablets (400 mg total) by mouth 2 (two) times daily.   levothyroxine 150 MCG tablet Commonly known as:  SYNTHROID, LEVOTHROID Take 150 mcg by mouth daily before breakfast.   losartan 100 MG tablet Commonly known as:  COZAAR TAKE ONE (1) TABLET BY MOUTH EVERY DAY   magnesium oxide 400 MG tablet Commonly known as:  MAG-OX Take 400 mg by mouth daily.     metFORMIN 500 MG tablet Commonly known as:  GLUCOPHAGE Take 500 mg by mouth 2 (two) times daily with a meal.   nitroGLYCERIN 0.4 MG SL tablet Commonly known as:  NITROSTAT Place 1 tablet (0.4 mg total) under the tongue every 5 (five) minutes as needed for chest pain.   potassium chloride SA 20 MEQ tablet Commonly known as:  KLOR-CON M20 Take 1 tablet (20 mEq total) by mouth daily.   rOPINIRole 0.5 MG tablet Commonly known as:  REQUIP Take 0.5 mg by mouth at bedtime.   sodium chloride 0.65 % Soln nasal spray Commonly known as:  OCEAN Place 1 spray into both nostrils as needed for congestion.       Follow-up Information    Purvis Kilts, MD. Schedule an appointment as soon as possible for a visit in 1 day(s).   Specialty:  Family Medicine Contact information: [address] [place] [address] [phone]        Kate Sable, MD. Schedule an appointment as soon as possible for a visit in 1 day(s).   Specialty:  Cardiology Contact information: [place] [address] [phone]           Major procedures and Radiology Reports - PLEASE review detailed and final reports thoroughly  -         Dg Chest 2 View  Result Date: 06/07/2016 CLINICAL DATA:  Initial evaluation for acute chest pain, shortness of breath. EXAM: CHEST  2 VIEW COMPARISON:  Prior radiograph from 10/28/2015. FINDINGS: The cardiac and mediastinal silhouettes are stable in size and contour, and remain within normal limits. Mild elevation the right hemidiaphragm, stable. No airspace consolidation, pleural effusion, or pulmonary edema is identified. There is no pneumothorax. No acute osseous abnormality identified. IMPRESSION: No active cardiopulmonary disease. Electronically Signed   By: Jeannine Boga M.D.   On: 06/07/2016 02:05    Micro Results     No results found for this or any previous visit (from  the past 240 hour(s)).  Today   Subjective     Rexene Agent today has no headache,no chest abdominal pain,no new weakness tingling or numbness, feels much better wants to go home today.     Objective   Blood pressure (!) 147/53, pulse 77, temperature 97.8 F (36.6 C), temperature source Oral, resp. rate 21, height 5\' 3"  (1.6 m), weight 67.7 kg (149 lb 5 oz), SpO2 98 %.   Intake/Output Summary (Last 24 hours) at 06/07/16 1000 Last data filed at 06/07/16 0900  Gross per 24 hour  Intake              360 ml  Output                0 ml  Net              360 ml    Exam Awake Alert, Oriented x 3, No new F.N deficits, Normal affect [place].AT,PERRAL Supple Neck,No JVD, No cervical lymphadenopathy appriciated.  Symmetrical Chest wall movement, Good air movement bilaterally, CTAB RRR,No Gallops,Rubs or new Murmurs, No Parasternal Heave +ve B.Sounds, Abd Soft, Non tender, No organomegaly appriciated, No rebound -guarding or rigidity. No Cyanosis, Clubbing or edema, No new Rash or bruise   Data Review   CBC w Diff: Lab Results  Component Value Date   WBC 10.6 (H) 06/07/2016   HGB 11.7 (L) 06/07/2016   HCT 35.4 (L) 06/07/2016   PLT 235 06/07/2016   LYMPHOPCT 20 06/07/2016   MONOPCT 7 06/07/2016   EOSPCT 1 06/07/2016   BASOPCT 0 06/07/2016    CMP: Lab Results  Component Value Date   NA 139 06/07/2016   K 4.0 06/07/2016   CL 106 06/07/2016   CO2 24 06/07/2016   BUN 26 (H) 06/07/2016   CREATININE 0.98 06/07/2016   CREATININE 0.88 04/14/2016   PROT 6.0 (L) 04/14/2016   ALBUMIN 4.1 04/14/2016   BILITOT 0.5 04/14/2016   ALKPHOS 80 04/14/2016   AST 9 (L) 04/14/2016   ALT 13 04/14/2016     Total Time in preparing paper work, data evaluation and todays exam - 35 minutes  Lala Lund K M.D on 06/07/2016 at 10:00 AM  [facility]   Office  [phone]

## 2016-06-07 NOTE — Progress Notes (Signed)
Patient received discharge instructions and verbalized understanding of instructions. Patient was escorted by staff via wheelchair to vehicle. Patient discharged to home in stable condition.

## 2016-06-07 NOTE — ED Triage Notes (Signed)
PT has hx of 3 MIs.  Started having chest pain around 12.  Took 1 SL nitro and 30mg  diltiazem

## 2016-06-07 NOTE — ED Notes (Signed)
Patient to radiology.

## 2016-06-08 ENCOUNTER — Ambulatory Visit (INDEPENDENT_AMBULATORY_CARE_PROVIDER_SITE_OTHER): Payer: PPO | Admitting: Neurology

## 2016-06-08 DIAGNOSIS — G4733 Obstructive sleep apnea (adult) (pediatric): Secondary | ICD-10-CM | POA: Diagnosis not present

## 2016-06-08 DIAGNOSIS — I5020 Unspecified systolic (congestive) heart failure: Secondary | ICD-10-CM

## 2016-06-08 DIAGNOSIS — M25472 Effusion, left ankle: Secondary | ICD-10-CM

## 2016-06-08 DIAGNOSIS — I2510 Atherosclerotic heart disease of native coronary artery without angina pectoris: Secondary | ICD-10-CM

## 2016-06-08 DIAGNOSIS — I480 Paroxysmal atrial fibrillation: Secondary | ICD-10-CM

## 2016-06-08 DIAGNOSIS — G452 Multiple and bilateral precerebral artery syndromes: Secondary | ICD-10-CM

## 2016-06-08 DIAGNOSIS — J301 Allergic rhinitis due to pollen: Secondary | ICD-10-CM

## 2016-06-08 DIAGNOSIS — M25471 Effusion, right ankle: Secondary | ICD-10-CM

## 2016-06-08 LAB — HEMOGLOBIN A1C
Hgb A1c MFr Bld: 7.2 % — ABNORMAL HIGH (ref 4.8–5.6)
MEAN PLASMA GLUCOSE: 160 mg/dL

## 2016-06-10 ENCOUNTER — Ambulatory Visit (INDEPENDENT_AMBULATORY_CARE_PROVIDER_SITE_OTHER): Payer: PPO | Admitting: Physician Assistant

## 2016-06-10 ENCOUNTER — Encounter: Payer: Self-pay | Admitting: Physician Assistant

## 2016-06-10 ENCOUNTER — Other Ambulatory Visit: Payer: Self-pay

## 2016-06-10 VITALS — BP 126/52 | HR 64 | Ht 63.0 in | Wt 149.0 lb

## 2016-06-10 DIAGNOSIS — I2510 Atherosclerotic heart disease of native coronary artery without angina pectoris: Secondary | ICD-10-CM

## 2016-06-10 DIAGNOSIS — I4891 Unspecified atrial fibrillation: Secondary | ICD-10-CM

## 2016-06-10 DIAGNOSIS — I10 Essential (primary) hypertension: Secondary | ICD-10-CM | POA: Diagnosis not present

## 2016-06-10 MED ORDER — DILTIAZEM HCL ER COATED BEADS 120 MG PO CP24: 120.0000 mg | ORAL_CAPSULE | Freq: Every day | ORAL | 3 refills | Status: DC

## 2016-06-10 MED ORDER — LOSARTAN POTASSIUM 100 MG PO TABS: ORAL_TABLET | ORAL | 3 refills | Status: DC

## 2016-06-10 NOTE — Patient Instructions (Addendum)
Your physician recommends that you schedule a follow-up appointment with Dr. Bronson Ing in February   Your physician has recommended you make the following change in your medication: Start Taking Cardizem 120 mg Daily   If you need a refill on your cardiac medications before your next appointment, please call your pharmacy.  Thank you for choosing [facility]!

## 2016-06-10 NOTE — Progress Notes (Signed)
Cardiology Office Note    Date:  06/10/2016   ID:  Virginia Huber, DOB 10/10/1945, MRN [id number]  PCP:  Purvis Kilts, MD  Cardiologist: Dr. Bronson Ing  Chief Complaint  Patient presents with  . Follow-up    History of Present Illness:  Virginia Huber is a 70 y.o. female with history of CAD status post remote MI and PTCA with scoring balloon to OM1 11/2014.NSTEMI 03/2015 status post DES to the proximal mid circumflex. NSTEMI 07/2015 status post scoring balloon/PTCA/DES of the distal RCA with PAF during the admission. She is maintained on Eliquis and was in NSR OV 03/13/16. Sleep study ordered and she has mild obstructive sleep apnea. Referral for CPap was made.  Patient was admitted to the hospital 06/07/16 with chest pain. In the ER she was found to be in A. fib with RVR. She had already taken her when necessary Cardizem before coming to the ER so she converted to sinus rhythm shortly after arrival.Troponins were negative. Her chest pain was atypical and she was discharged home to follow-up with [place].CHADSVASC=6.  Patient comes in today for follow-up from her ER visit. She said she had a lot of palpitations last night and when she checked her blood pressure was 157/110. She sat in her recliner and when she took her blood pressure again it was normal. She says she was not in atrial fibrillation. She has had no further chest pain. She says she usually only has chest pain when she goes into A. fib.      Past Medical History:  Diagnosis Date  . Arthritis   . Atrial fibrillation and flutter ([place])    a. h/o PAF/flutter during admission in 2013 for PNA. b. PAF during adm for NSTEMI 07/2015.  [name] B12 deficiency anemia   . Coronary artery disease 11/30/2014   a. remote MI. b. h/o PTCA with scoring balloon to OM1 11/2014. c. NSTEMI 03/2015 s/p DES to prox-mid Cx. d. NSTEMI 07/2015 s/p scoring balloon/PTCA/DES to dRCA with PAF during that admission  . Cutaneous lupus erythematosus   . GERD  (gastroesophageal reflux disease)   . Heart block   . History of blood transfusion 1980's   2nd surgical procedures  . HTN (hypertension)   . Hypercholesteremia   . Hypothyroidism   . Myocardial infarction 02/2012  . Ovarian tumor   . PAD (peripheral artery disease) ([place])    a. s/p LE angio 2015; followed by Dr. Fletcher Anon - managed medically.  . Pain with urination 05/08/2015  . Superficial fungus infection of skin 06/29/2013  . TIA (transient ischemic attack) 08/2001; ~ 2006  . Type II diabetes mellitus ([place])   . UTI (urinary tract infection) 05/08/2013    Past Surgical History:  Procedure Laterality Date  . ABDOMINAL AORTAGRAM N/A 01/03/2014   Procedure: ABDOMINAL [name];  Surgeon: Wellington Hampshire, MD;  Location: [facility];  Service: Cardiovascular;  Laterality: N/A;  . ABDOMINAL HYSTERECTOMY  1972   "partial"  . APPENDECTOMY  1970's  . CARDIAC CATHETERIZATION  2008   Tiny OM-2 with 90% narrowing. Med tx.  [name] CARDIAC CATHETERIZATION N/A 11/30/2014   Procedure: Left Heart Cath and Coronary Angiography;  Surgeon: Troy Sine, MD; LAD 20%, CFX 50%, OM1 95%, right PLB 30%, LV normal   . CARDIAC CATHETERIZATION N/A 11/30/2014   Procedure: Coronary Balloon Angioplasty;  Surgeon: Troy Sine, MD;  Angiosculpt scoring balloon and PTCA to the OM1 reducing stenosis from 95% to less than 10%  .  CARDIAC CATHETERIZATION N/A 04/03/2015   Procedure: Left Heart Cath and Coronary Angiography;  Surgeon: Jolaine Artist, MD; dLAD 50%, CFX 90%, OM1 100%, PLA 15%, LVEDP 13    . CARDIAC CATHETERIZATION N/A 04/03/2015   Procedure: Coronary Stent Intervention;  Surgeon: Sherren Mocha, MD; 3.0x18 mm Xience DES to the CFX    . CARDIAC CATHETERIZATION N/A 08/02/2015   Procedure: Left Heart Cath and Coronary Angiography;  Surgeon: Troy Sine, MD;  Location: [facility];  Service: Cardiovascular;  Laterality: N/A;  . CARDIAC CATHETERIZATION N/A 08/02/2015   Procedure: Coronary Stent  Intervention;  Surgeon: Troy Sine, MD;  Location: [facility];  Service: Cardiovascular;  Laterality: N/A;  . cardiac stents    . CHOLECYSTECTOMY OPEN  1990's  . COLONOSCOPY  2005   Dr. Laural Golden: pancolonic divericula, polyp, path unknown currently  . COLONOSCOPY  2012   Dr. Oneida Alar: Normal TI, scattered diverticula in entire colon, small internal hemorrhoids, normal colon biopsies. Colonoscopy in 5-10 years.   . COLOSTOMY  05/1979  . COLOSTOMY REVERSAL  11/1979  . EXCISIONAL HEMORRHOIDECTOMY  1970's  . EYE SURGERY Left 2000   "branch vein occlusion"  . EYE SURGERY Left ~ 2001   "smoothed out wrinkle"  . LEFT OOPHORECTOMY  05/1979   nicked bowel, peritonitis, colostomy; colostomy reversed 1981   . LOWER EXTREMITY ANGIOGRAM N/A 01/03/2014   Procedure: LOWER EXTREMITY ANGIOGRAM;  Surgeon: Wellington Hampshire, MD;  Location: [facility];  Service: Cardiovascular;  Laterality: N/A;  . Nuclear med stress test  10/2011   Small area of mild ischemia inferoapically.  [name] PARTIAL HYSTERECTOMY  1970's   left ovaries, then ovaries removed later due tumors   . RIGHT OOPHORECTOMY  1970's    Current Medications: Outpatient Medications Prior to Visit  Medication Sig Dispense Refill  . amLODipine (NORVASC) 10 MG tablet TAKE ONE (1) TABLET EACH DAY 90 tablet 3  . apixaban (ELIQUIS) 5 MG TABS tablet Take 1 tablet (5 mg total) by mouth 2 (two) times daily. 56 tablet 0  . atorvastatin (LIPITOR) 80 MG tablet Take 1 tablet (80 mg total) by mouth daily at 6 PM. 30 tablet 6  . cholecalciferol (VITAMIN D) 1000 UNITS tablet Take 1,000 Units by mouth at bedtime.     . clopidogrel (PLAVIX) 75 MG tablet Take 1 tablet (75 mg total) by mouth daily. 90 tablet 3  . furosemide (LASIX) 20 MG tablet Take 2 tablets (40 mg total) by mouth 2 (two) times daily. 60 tablet 6  . insulin NPH-regular Human (NOVOLIN 70/30) (70-30) 100 UNIT/ML injection Inject 30-40 Units into the skin 2 (two) times daily with a meal. Inject 30  units with breakfast and 40 units with supper only if pre-meal blood glucose is above 90 mg/dL.    [name] labetalol (NORMODYNE) 200 MG tablet Take 2 tablets (400 mg total) by mouth 2 (two) times daily. 120 tablet 6  . levothyroxine (SYNTHROID, LEVOTHROID) 150 MCG tablet Take 150 mcg by mouth daily before breakfast.    . losartan (COZAAR) 100 MG tablet TAKE ONE (1) TABLET BY MOUTH EVERY DAY 90 tablet 3  . magnesium oxide (MAG-OX) 400 MG tablet Take 400 mg by mouth daily.    . metFORMIN (GLUCOPHAGE) 500 MG tablet Take 500 mg by mouth 2 (two) times daily with a meal.    . nitroGLYCERIN (NITROSTAT) 0.4 MG SL tablet Place 1 tablet (0.4 mg total) under the tongue every 5 (five) minutes as needed for  chest pain. 25 tablet 12  . Omega-3 Fatty Acids (FISH OIL) 1200 MG CAPS Take 2 capsules (2,400 mg total) by mouth 2 (two) times daily. 120 each 3  . potassium chloride SA (KLOR-CON M20) 20 MEQ tablet Take 1 tablet (20 mEq total) by mouth daily. 90 tablet 3  . rOPINIRole (REQUIP) 0.5 MG tablet Take 0.5 mg by mouth at bedtime.   0  . sodium chloride (OCEAN) 0.65 % SOLN nasal spray Place 1 spray into both nostrils as needed for congestion. 1 Bottle 0   No facility-administered medications prior to visit.      Allergies:   Penicillins and Percocet [oxycodone-acetaminophen]   Social History   Social History  . Marital status: Married    Spouse name: N/A  . Number of children: N/A  . Years of education: N/A   Occupational History  . Retired Retired    Research officer, political party   Social History Main Topics  . Smoking status: Never Smoker  . Smokeless tobacco: Never Used     Comment: Never smoked  . Alcohol use No  . Drug use: No  . Sexual activity: No     Comment: hyst   Other Topics Concern  . None   Social History Narrative  . None     Family History:  The patient's   family history includes Diabetes in her brother; Heart disease in her brother, brother, father, mother, and sister; Lupus in her  daughter; Thyroid disease in her brother and brother.   ROS:   Please see the history of present illness.    Review of Systems  Constitution: Negative.  HENT: Negative.   Eyes: Negative.   Cardiovascular: Positive for chest pain, irregular heartbeat and palpitations.  Respiratory: Positive for shortness of breath.   Hematologic/Lymphatic: Negative.   Musculoskeletal: Negative.  Negative for joint pain.  Gastrointestinal: Negative.   Genitourinary: Negative.   Neurological: Negative.    All other systems reviewed and are negative.   PHYSICAL EXAM:   VS:  BP (!) 126/52   Pulse 64   Ht 5\' 3"  (1.6 m)   Wt 149 lb (67.6 kg)   SpO2 97%   BMI 26.39 kg/m   Physical Exam  GEN: Well nourished, well developed, in no acute distress  Neck: no JVD, carotid bruits, or masses Cardiac:RRR; 2/6 systolic murmur at the left sternal border Respiratory:  clear to auscultation bilaterally, normal work of breathing GI: soft, nontender, nondistended, + BS Ext: without cyanosis, clubbing, or edema, Good distal pulses bilaterally MS: no deformity or atrophy  Skin: warm and dry, no rash Psych: euthymic mood, full affect  Wt Readings from Last 3 Encounters:  06/10/16 149 lb (67.6 kg)  06/07/16 149 lb 5 oz (67.7 kg)  05/13/16 151 lb (68.5 kg)      Studies/Labs Reviewed:   EKG:  EKG is  ordered today.  The ekg ordered today demonstrates Normal sinus rhythm with nonspecific ST-T wave changes inferolaterally, unchanged from prior tracings  Recent Labs: 03/13/2016: Magnesium 1.9 04/14/2016: ALT 13; TSH 2.36 06/07/2016: B Natriuretic Peptide 155.0; BUN 26; Creatinine, Ser 0.98; Hemoglobin 11.7; Platelets 235; Potassium 4.0; Sodium 139   Lipid Panel    Component Value Date/Time   CHOL 116 (L) 04/14/2016 0910   TRIG 439 (H) 04/14/2016 0910   HDL 24 (L) 04/14/2016 0910   CHOLHDL 4.8 04/14/2016 0910   VLDL NOT CALC 04/14/2016 0910   LDLCALC NOT CALC 04/14/2016 0910    Additional studies/  records that  were reviewed today include:  Conclusion   1st RPLB lesion, 50% stenosed.  Dist RCA lesion, 30% stenosed.  Ost 1st Mrg to 1st Mrg lesion, 100% stenosed.  Ost LAD lesion, 40% stenosed.  Mid LAD lesion, 40% stenosed.  Post Atrio lesion, 90% stenosed. Post intervention, there is a 0% residual stenosis.  The left ventricular systolic function is normal.  Mid RCA lesion, 20% stenosed.   Normal LV function without focal segmental wall motion abnormalities and ejection fraction of 55%.   Significant coronary obstructive disease with diffuse mild luminal narrowing of the LAD with 40% proximal and mid stenoses; widely patent stent in the proximal circumflex with old occlusion of the marginal branch which had arisen in the region of the stented segment but with evidence for very faint collateralization to this distal marginal vessel from the the LAD; a large dominant RCA with 20%, mild mid narrowing, 30% narrowing after the acute margin and focal 90% stenosis distally prior to a PDA 2 vessel with 50% narrowing at the ostium of this PDA prior to a bend in the vessel with the RCA ending in the PLA vessel.   Successful percutaneous cardiac intervention to the distal RCA treated with Angiosculpt scoring balloon, PTCA, and ultimate stenting with a 2.58 mm Xience Alpine DES stent postdilated to 2.51 mm with a 90% stenosis reduced to 0% and no change in the ostial PDA narrowing.   RECOMMENDATION: Since the patient suffered a non-ST segment elevation myocardial infarction on Plavix, Brilinta was administered for oral antiplatelet therapy.  It was felt most likely the patient's atrial fibrillation was ischemic mediated.  If anticoagulation is necessary, Brilinta will need to be discontinued and changed back to Plavix due to potential bleed risk if  triple therapy is needed short-term.        ASSESSMENT:    1. Atrial fibrillation, unspecified type ([place])   2. Atherosclerosis of native  coronary artery of native heart without angina pectoris   3. Essential hypertension      PLAN:  In order of problems listed above:  PAF with recent emergency room visit where she converted to normal sinus rhythm after taking her when necessary diltiazem. Patient continues to have palpitations but doesn't think she's having atrial fibrillation. Will place her on daily Cardizem 120 mg once a day in addition to her labetalol 400 mg twice a day. She does monitor her heart rate and blood pressure daily. She is to call if her heart rate drops below 50. And went to consider amiodarone in the future. She was on previously but stopped 09/2015.  Discussed with Dr.Koneswaran who concurs and will consider amiodarone in the future if diltiazem does not help.  CAD status post remote MI and PTCA with scoring balloon to OM1 11/2014.NSTEMI 03/2015 status post DES to the proximal mid circumflex. NSTEMI 07/2015 status post scoring balloon/PTCA/DES of the distal RCA with PAF during the admission. Patient had chest pain when she was in atrial fibrillation relieved with one sublingual nitroglycerin. No further chest pain since she's been in normal sinus rhythm. She has nonspecific ST-T wave changes on EKG inferior lateral that are unchanged from prior tracings and troponins were negative in the emergency room. Continue medical treatment.  Hypertension patient's blood pressure was elevated last evening when she was having palpitations. Hopefully with the addition of Cardizem her blood pressure will be better controlled.     Medication Adjustments/Labs and Tests Ordered: Current medicines are reviewed at length with the patient today.  Concerns  regarding medicines are outlined above.  Medication changes, Labs and Tests ordered today are listed in the Patient Instructions below. Patient Instructions  Your physician recommends that you schedule a follow-up appointment with Dr. Bronson Ing in February   Your physician has  recommended you make the following change in your medication: Start Taking Cardizem 120 mg Daily   If you need a refill on your cardiac medications before your next appointment, please call your pharmacy.  Thank you for choosing [facility]!         Sumner Boast, PA-C  06/10/2016 11:35 AM    [facility] [address], [place], [place]  [address] Phone: [phone]; Fax: [phone]

## 2016-06-11 ENCOUNTER — Ambulatory Visit (HOSPITAL_COMMUNITY)
Admission: RE | Admit: 2016-06-11 | Discharge: 2016-06-11 | Disposition: A | Discharge status: Home / Self Care | Payer: PPO | Source: Ambulatory Visit | Attending: Adult Health | Admitting: Adult Health

## 2016-06-11 ENCOUNTER — Other Ambulatory Visit: Payer: Self-pay | Admitting: Adult Health

## 2016-06-11 ENCOUNTER — Encounter (HOSPITAL_COMMUNITY): Payer: Self-pay

## 2016-06-11 DIAGNOSIS — Z1231 Encounter for screening mammogram for malignant neoplasm of breast: Secondary | ICD-10-CM

## 2016-06-13 ENCOUNTER — Emergency Department (HOSPITAL_COMMUNITY): Payer: PPO

## 2016-06-13 ENCOUNTER — Encounter (HOSPITAL_COMMUNITY): Payer: Self-pay | Admitting: *Deleted

## 2016-06-13 ENCOUNTER — Inpatient Hospital Stay (HOSPITAL_COMMUNITY)
Admission: EM | Admit: 2016-06-13 | Discharge: 2016-06-15 | DRG: 308 | Disposition: A | Discharge status: Home / Self Care | Payer: PPO | Attending: Internal Medicine | Admitting: Internal Medicine

## 2016-06-13 DIAGNOSIS — I482 Chronic atrial fibrillation, unspecified: Secondary | ICD-10-CM

## 2016-06-13 DIAGNOSIS — I110 Hypertensive heart disease with heart failure: Secondary | ICD-10-CM | POA: Diagnosis not present

## 2016-06-13 DIAGNOSIS — E1151 Type 2 diabetes mellitus with diabetic peripheral angiopathy without gangrene: Secondary | ICD-10-CM | POA: Diagnosis not present

## 2016-06-13 DIAGNOSIS — R079 Chest pain, unspecified: Secondary | ICD-10-CM | POA: Diagnosis not present

## 2016-06-13 DIAGNOSIS — E785 Hyperlipidemia, unspecified: Secondary | ICD-10-CM | POA: Diagnosis not present

## 2016-06-13 DIAGNOSIS — L930 Discoid lupus erythematosus: Secondary | ICD-10-CM | POA: Diagnosis present

## 2016-06-13 DIAGNOSIS — Z8249 Family history of ischemic heart disease and other diseases of the circulatory system: Secondary | ICD-10-CM | POA: Diagnosis not present

## 2016-06-13 DIAGNOSIS — Z794 Long term (current) use of insulin: Secondary | ICD-10-CM | POA: Diagnosis not present

## 2016-06-13 DIAGNOSIS — Z955 Presence of coronary angioplasty implant and graft: Secondary | ICD-10-CM | POA: Diagnosis not present

## 2016-06-13 DIAGNOSIS — Z79899 Other long term (current) drug therapy: Secondary | ICD-10-CM | POA: Diagnosis not present

## 2016-06-13 DIAGNOSIS — Z90722 Acquired absence of ovaries, bilateral: Secondary | ICD-10-CM

## 2016-06-13 DIAGNOSIS — I252 Old myocardial infarction: Secondary | ICD-10-CM

## 2016-06-13 DIAGNOSIS — I4892 Unspecified atrial flutter: Secondary | ICD-10-CM | POA: Diagnosis present

## 2016-06-13 DIAGNOSIS — E039 Hypothyroidism, unspecified: Secondary | ICD-10-CM | POA: Diagnosis present

## 2016-06-13 DIAGNOSIS — Z8673 Personal history of transient ischemic attack (TIA), and cerebral infarction without residual deficits: Secondary | ICD-10-CM | POA: Diagnosis not present

## 2016-06-13 DIAGNOSIS — I25700 Atherosclerosis of coronary artery bypass graft(s), unspecified, with unstable angina pectoris: Secondary | ICD-10-CM | POA: Diagnosis not present

## 2016-06-13 DIAGNOSIS — Z7902 Long term (current) use of antithrombotics/antiplatelets: Secondary | ICD-10-CM | POA: Diagnosis not present

## 2016-06-13 DIAGNOSIS — I4891 Unspecified atrial fibrillation: Secondary | ICD-10-CM | POA: Diagnosis present

## 2016-06-13 DIAGNOSIS — K219 Gastro-esophageal reflux disease without esophagitis: Secondary | ICD-10-CM | POA: Diagnosis present

## 2016-06-13 DIAGNOSIS — Z7901 Long term (current) use of anticoagulants: Secondary | ICD-10-CM | POA: Diagnosis not present

## 2016-06-13 DIAGNOSIS — I2510 Atherosclerotic heart disease of native coronary artery without angina pectoris: Secondary | ICD-10-CM | POA: Diagnosis not present

## 2016-06-13 DIAGNOSIS — Z885 Allergy status to narcotic agent status: Secondary | ICD-10-CM

## 2016-06-13 DIAGNOSIS — R0789 Other chest pain: Secondary | ICD-10-CM | POA: Diagnosis not present

## 2016-06-13 DIAGNOSIS — Z880 Allergy status to penicillin: Secondary | ICD-10-CM

## 2016-06-13 DIAGNOSIS — R0602 Shortness of breath: Secondary | ICD-10-CM | POA: Diagnosis not present

## 2016-06-13 DIAGNOSIS — R001 Bradycardia, unspecified: Secondary | ICD-10-CM | POA: Diagnosis not present

## 2016-06-13 DIAGNOSIS — I480 Paroxysmal atrial fibrillation: Secondary | ICD-10-CM | POA: Diagnosis not present

## 2016-06-13 DIAGNOSIS — I5031 Acute diastolic (congestive) heart failure: Secondary | ICD-10-CM | POA: Diagnosis not present

## 2016-06-13 LAB — PROTIME-INR
INR: 1.05
Prothrombin Time: 13.8 seconds (ref 11.4–15.2)

## 2016-06-13 LAB — APTT: aPTT: 34 seconds (ref 24–36)

## 2016-06-13 LAB — CBC
HCT: 37.4 % (ref 36.0–46.0)
HEMOGLOBIN: 12.6 g/dL (ref 12.0–15.0)
MCH: 31 pg (ref 26.0–34.0)
MCHC: 33.7 g/dL (ref 30.0–36.0)
MCV: 92.1 fL (ref 78.0–100.0)
Platelets: 282 10*3/uL (ref 150–400)
RBC: 4.06 MIL/uL (ref 3.87–5.11)
RDW: 13.9 % (ref 11.5–15.5)
WBC: 10.6 10*3/uL — ABNORMAL HIGH (ref 4.0–10.5)

## 2016-06-13 MED ORDER — DILTIAZEM HCL 100 MG IV SOLR
5.0000 mg/h | INTRAVENOUS | Status: DC
  Administered 2016-06-14: 5 mg/h via INTRAVENOUS

## 2016-06-13 MED ORDER — DILTIAZEM HCL 100 MG IV SOLR
INTRAVENOUS | Status: AC
  Filled 2016-06-13: qty 100

## 2016-06-13 MED ORDER — DILTIAZEM LOAD VIA INFUSION
10.0000 mg | Freq: Once | INTRAVENOUS | Status: AC
  Administered 2016-06-14: 10 mg via INTRAVENOUS
  Filled 2016-06-13: qty 10

## 2016-06-13 MED ORDER — DILTIAZEM HCL 25 MG/5ML IV SOLN
INTRAVENOUS | Status: AC
  Filled 2016-06-13: qty 5

## 2016-06-13 NOTE — ED Triage Notes (Signed)
Pt states her heart began racing at about 11 pm. Pt states she was just here last Saturday for the same. Pt also c/o centralized chest pain with nausea. Pt states her heart rate at home was 138.

## 2016-06-13 NOTE — ED Provider Notes (Signed)
[facility]-EMERGENCY DEPT Provider Note    By signing my name below, I, Bea Graff, attest that this documentation has been prepared under the direction and in the presence of Rolland Porter, MD. Electronically Signed: Bea Graff, ED Scribe. 06/14/16. 12:41 AM.    History   Chief Complaint Chief Complaint  Patient presents with  . Palpitations   The history is provided by the patient and medical records. No language interpreter was used.    HPI Comments:  Virginia Huber is a 70 y.o. female with PMHx of atrial fibrillation, CAD, HTN, HLD, T2DM, TIA and hypothyroidism, who presents to the Emergency Department complaining of palpitations that began about one hour ago while lying in bed. She reports associated centralized CP, dizziness and nausea. She states she has had these symptoms about 7 times in the past 18 months. She was recently started on Diltiazem CR 120mg  (4 days ago) and has been on Diltiazem 30 mg as needed. She has not taken anything to treat her symptoms. She denies modifying factors. She denies leg swelling, vomiting, diaphoresis. Cardiologist is Dr. Jacinta Shoe. She has had stent placement. She is currently on Eliquis and Plavix.   Past Medical History:  Diagnosis Date  . Arthritis   . Atrial fibrillation and flutter ([place])    a. h/o PAF/flutter during admission in 2013 for PNA. b. PAF during adm for NSTEMI 07/2015.  [name] B12 deficiency anemia   . Coronary artery disease 11/30/2014   a. remote MI. b. h/o PTCA with scoring balloon to OM1 11/2014. c. NSTEMI 03/2015 s/p DES to prox-mid Cx. d. NSTEMI 07/2015 s/p scoring balloon/PTCA/DES to dRCA with PAF during that admission  . Cutaneous lupus erythematosus   . GERD (gastroesophageal reflux disease)   . Heart block   . History of blood transfusion 1980's   2nd surgical procedures  . HTN (hypertension)   . Hypercholesteremia   . Hypothyroidism   . Myocardial infarction 02/2012  . Ovarian tumor   . PAD (peripheral artery  disease) ([place])    a. s/p LE angio 2015; followed by Dr. Fletcher Anon - managed medically.  . Pain with urination 05/08/2015  . Superficial fungus infection of skin 06/29/2013  . TIA (transient ischemic attack) 08/2001; ~ 2006  . Type II diabetes mellitus ([place])   . UTI (urinary tract infection) 05/08/2013    Patient Active Problem List   Diagnosis Date Noted  . Bradycardia 06/14/2016  . Systolic congestive heart failure ([place]) 05/13/2016  . Multiple and bilateral precerebral artery syndromes 05/13/2016  . OSA (obstructive sleep apnea) 05/13/2016  . Chronic diarrhea 12/25/2015  . Atrial fibrillation with RVR ([place]) 10/28/2015  . Atrial fibrillation with tachycardic ventricular rate ([place]) 09/17/2015  . Chest pain 08/02/2015  . Atrial fibrillation with rapid ventricular response ([place])   . Pain with urination 05/08/2015  . NSTEMI (non-ST elevated myocardial infarction) ([place]) 04/02/2015  . CAD in native artery 11/30/2014  . 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   . PAD (peripheral artery disease) ([place]) 12/26/2013  . Superficial fungus infection of skin 06/29/2013  . UTI (urinary tract infection) 05/08/2013  . Hypokalemia 03/05/2012  . B12 deficiency anemia 03/02/2012  . Bronchospasm 03/02/2012  . Community acquired bacterial pneumonia 03/01/2012  . Acute respiratory failure ([place]) 03/01/2012  . DM type 2 causing vascular disease ([place]) 02/29/2012  . Paroxysmal atrial fibrillation with RVR ([place]) 02/28/2012  . Hypothyroidism 02/28/2012  . RLQ abdominal pain 11/24/2010  . OVERWEIGHT/OBESITY 06/03/2010  .  Essential hypertension 06/03/2010  . Hyperlipidemia 12/27/2009  . CAD, NATIVE VESSEL 05/17/2009  . Palpitations 05/17/2009  . FRACTURE, TOE 12/06/2007    Past Surgical History:  Procedure Laterality Date  . ABDOMINAL AORTAGRAM N/A 01/03/2014   Procedure: ABDOMINAL [name];  Surgeon: Wellington Hampshire, MD;  Location: [facility]ATH [facility];  Service:  Cardiovascular;  Laterality: N/A;  . ABDOMINAL HYSTERECTOMY  1972   "partial"  . APPENDECTOMY  1970's  . CARDIAC CATHETERIZATION  2008   Tiny OM-2 with 90% narrowing. Med tx.  [name] CARDIAC CATHETERIZATION N/A 11/30/2014   Procedure: Left Heart Cath and Coronary Angiography;  Surgeon: Troy Sine, MD; LAD 20%, CFX 50%, OM1 95%, right PLB 30%, LV normal   . CARDIAC CATHETERIZATION N/A 11/30/2014   Procedure: Coronary Balloon Angioplasty;  Surgeon: Troy Sine, MD;  Angiosculpt scoring balloon and PTCA to the OM1 reducing stenosis from 95% to less than 10%  . CARDIAC CATHETERIZATION N/A 04/03/2015   Procedure: Left Heart Cath and Coronary Angiography;  Surgeon: Jolaine Artist, MD; dLAD 50%, CFX 90%, OM1 100%, PLA 15%, LVEDP 13    . CARDIAC CATHETERIZATION N/A 04/03/2015   Procedure: Coronary Stent Intervention;  Surgeon: Sherren Mocha, MD; 3.0x18 mm Xience DES to the CFX    . CARDIAC CATHETERIZATION N/A 08/02/2015   Procedure: Left Heart Cath and Coronary Angiography;  Surgeon: Troy Sine, MD;  Location: [facility];  Service: Cardiovascular;  Laterality: N/A;  . CARDIAC CATHETERIZATION N/A 08/02/2015   Procedure: Coronary Stent Intervention;  Surgeon: Troy Sine, MD;  Location: [facility];  Service: Cardiovascular;  Laterality: N/A;  . cardiac stents    . CHOLECYSTECTOMY OPEN  1990's  . COLONOSCOPY  2005   Dr. Laural Golden: pancolonic divericula, polyp, path unknown currently  . COLONOSCOPY  2012   Dr. Oneida Alar: Normal TI, scattered diverticula in entire colon, small internal hemorrhoids, normal colon biopsies. Colonoscopy in 5-10 years.   . COLOSTOMY  05/1979  . COLOSTOMY REVERSAL  11/1979  . EXCISIONAL HEMORRHOIDECTOMY  1970's  . EYE SURGERY Left 2000   "branch vein occlusion"  . EYE SURGERY Left ~ 2001   "smoothed out wrinkle"  . LEFT OOPHORECTOMY  05/1979   nicked bowel, peritonitis, colostomy; colostomy reversed 1981   . LOWER EXTREMITY ANGIOGRAM N/A 01/03/2014    Procedure: LOWER EXTREMITY ANGIOGRAM;  Surgeon: Wellington Hampshire, MD;  Location: [facility] CATH [facility];  Service: Cardiovascular;  Laterality: N/A;  . Nuclear med stress test  10/2011   Small area of mild ischemia inferoapically.  [name] PARTIAL HYSTERECTOMY  1970's   left ovaries, then ovaries removed later due tumors   . RIGHT OOPHORECTOMY  1970's    OB History    Gravida Para Term Preterm AB Living   3 2     1 2    SAB TAB Ectopic Multiple Live Births   1       2       Home Medications    Prior to Admission medications   Medication Sig Start Date End Date Taking? Authorizing Provider  amLODipine (NORVASC) 10 MG tablet TAKE ONE (1) TABLET EACH DAY 07/26/15   Herminio Commons, MD  apixaban (ELIQUIS) 5 MG TABS tablet Take 1 tablet (5 mg total) by mouth 2 (two) times daily. 12/16/15   Herminio Commons, MD  atorvastatin (LIPITOR) 80 MG tablet Take 1 tablet (80 mg total) by mouth daily at 6 PM. 04/27/16   Herminio Commons, MD  cholecalciferol (  VITAMIN D) 1000 UNITS tablet Take 1,000 Units by mouth at bedtime.     Historical Provider, MD  clopidogrel (PLAVIX) 75 MG tablet Take 1 tablet (75 mg total) by mouth daily. 08/09/15   Dayna N Dunn, PA-C  diltiazem (CARDIZEM CD) 120 MG 24 hr capsule Take 1 capsule (120 mg total) by mouth daily. 06/10/16 09/08/16  Imogene Burn, PA-C  furosemide (LASIX) 20 MG tablet Take 2 tablets (40 mg total) by mouth 2 (two) times daily. 02/20/16   Herminio Commons, MD  insulin NPH-regular Human (NOVOLIN 70/30) (70-30) 100 UNIT/ML injection Inject 30-40 Units into the skin 2 (two) times daily with a meal. Inject 30 units with breakfast and 40 units with supper only if pre-meal blood glucose is above 90 mg/dL.    Historical Provider, MD  labetalol (NORMODYNE) 200 MG tablet Take 2 tablets (400 mg total) by mouth 2 (two) times daily. 12/30/15   Herminio Commons, MD  levothyroxine (SYNTHROID, LEVOTHROID) 150 MCG tablet Take 150 mcg by mouth daily before breakfast.     Historical Provider, MD  losartan (COZAAR) 100 MG tablet TAKE ONE (1) TABLET BY MOUTH EVERY DAY 06/10/16   Herminio Commons, MD  magnesium oxide (MAG-OX) 400 MG tablet Take 400 mg by mouth daily.    Historical Provider, MD  metFORMIN (GLUCOPHAGE) 500 MG tablet Take 500 mg by mouth 2 (two) times daily with a meal.    Historical Provider, MD  nitroGLYCERIN (NITROSTAT) 0.4 MG SL tablet Place 1 tablet (0.4 mg total) under the tongue every 5 (five) minutes as needed for chest pain. 04/04/15   Evelene Croon Barrett, PA-C  Omega-3 Fatty Acids (FISH OIL) 1200 MG CAPS Take 2 capsules (2,400 mg total) by mouth 2 (two) times daily. 04/27/16   Cassandria Anger, MD  potassium chloride SA (KLOR-CON M20) 20 MEQ tablet Take 1 tablet (20 mEq total) by mouth daily. 10/02/15   Isaiah Serge, NP  rOPINIRole (REQUIP) 0.5 MG tablet Take 0.5 mg by mouth at bedtime.  03/05/14   Historical Provider, MD  sodium chloride (OCEAN) 0.65 % SOLN nasal spray Place 1 spray into both nostrils as needed for congestion. 05/13/16   Larey Seat, MD    Family History Family History  Problem Relation Age of Onset  . Heart disease Mother     deceased  . Heart disease Father     deceased, heart disease  . Diabetes Brother   . Heart disease Brother   . Thyroid disease Brother   . Heart disease Sister   . Heart disease Brother   . Thyroid disease Brother   . Lupus Daughter   . Colon cancer Neg Hx     Social History Social History  Substance Use Topics  . Smoking status: Never Smoker  . Smokeless tobacco: Never Used     Comment: Never smoked  . Alcohol use No     Allergies   Penicillins and Percocet [oxycodone-acetaminophen]   Review of Systems Review of Systems  Constitutional: Negative for diaphoresis.  Cardiovascular: Positive for chest pain and palpitations. Negative for leg swelling.  Gastrointestinal: Positive for nausea. Negative for vomiting.  Neurological: Positive for dizziness.  All other systems  reviewed and are negative.    Physical Exam Updated Vital Signs BP 140/82 (BP Location: Left Arm)   Pulse (!) 130   Temp 97.9 F (36.6 C) (Oral)   Resp 20   Ht 5\' 3"  (1.6 m)   Wt 149 lb (67.6 kg)  SpO2 100%   BMI 26.39 kg/m   Physical Exam  Constitutional: She is oriented to person, place, and time. She appears well-developed and well-nourished.  Non-toxic appearance. She does not appear ill. No distress.  HENT:  Head: Normocephalic and atraumatic.  Right Ear: External ear normal.  Left Ear: External ear normal.  Nose: Nose normal. No mucosal edema or rhinorrhea.  Mouth/Throat: Oropharynx is clear and moist and mucous membranes are normal. No dental abscesses or uvula swelling.  Eyes: Conjunctivae and EOM are normal. Pupils are equal, round, and reactive to light.  Neck: Normal range of motion and full passive range of motion without pain. Neck supple.  Cardiovascular: Normal heart sounds.  An irregularly irregular rhythm present. Tachycardia present.  Exam reveals no gallop and no friction rub.   No murmur heard. Pulmonary/Chest: Effort normal and breath sounds normal. No respiratory distress. She has no wheezes. She has no rhonchi. She has no rales. She exhibits no tenderness and no crepitus.  Abdominal: Soft. Normal appearance and bowel sounds are normal. She exhibits no distension. There is no tenderness. There is no rebound and no guarding.  Musculoskeletal: Normal range of motion. She exhibits no edema or tenderness.  Moves all extremities well.   Neurological: She is alert and oriented to person, place, and time. She has normal strength. No cranial nerve deficit.  Skin: Skin is warm, dry and intact. No rash noted. No erythema. No pallor.  Psychiatric: She has a normal mood and affect. Her speech is normal and behavior is normal. Her mood appears not anxious.  Nursing note and vitals reviewed.    ED Treatments / Results  DIAGNOSTIC STUDIES: Oxygen Saturation is 100%  on RA, normal by my interpretation.   COORDINATION OF CARE: 11:57 PM- Will order Cardizem drip. Pt verbalizes understanding and agrees to plan.  Medications  diltiazem (CARDIZEM) 1 mg/mL load via infusion 10 mg (10 mg Intravenous Bolus from Bag 06/14/16 0005)    And  diltiazem (CARDIZEM) 100 mg in dextrose 5 % 100 mL (1 mg/mL) infusion (0 mg/hr Intravenous Stopped 06/14/16 0321)  diltiazem (CARDIZEM) 25 MG/5ML injection (not administered)  nitroGLYCERIN (NITROGLYN) 2 % ointment 1 inch (1 inch Topical Not Given 06/14/16 0321)  atropine 1 MG/ML injection (not administered)  atropine injection 0.5 mg (not administered)  atropine injection 1 mg (1 mg Intravenous Given 06/14/16 0324)  atropine injection 0.5 mg (0.5 mg Intravenous Given 06/14/16 0325)    Labs (all labs ordered are listed, but only abnormal results are displayed) Results for orders placed or performed during the hospital encounter of 38/10/17  Basic metabolic panel  Result Value Ref Range   Sodium 139 135 - 145 mmol/L   Potassium 4.0 3.5 - 5.1 mmol/L   Chloride 106 101 - 111 mmol/L   CO2 23 22 - 32 mmol/L   Glucose, Bld 269 (H) 65 - 99 mg/dL   BUN 24 (H) 6 - 20 mg/dL   Creatinine, Ser 1.02 (H) 0.44 - 1.00 mg/dL   Calcium 9.5 8.9 - 10.3 mg/dL   GFR calc non Af Amer 54 (L) >60 mL/min   GFR calc Af Amer >60 >60 mL/min   Anion gap 10 5 - 15  Troponin I  Result Value Ref Range   Troponin I <0.03 <0.03 ng/mL  CBC  Result Value Ref Range   WBC 10.6 (H) 4.0 - 10.5 K/uL   RBC 4.06 3.87 - 5.11 MIL/uL   Hemoglobin 12.6 12.0 - 15.0 g/dL   HCT  37.4 36.0 - 46.0 %   MCV 92.1 78.0 - 100.0 fL   MCH 31.0 26.0 - 34.0 pg   MCHC 33.7 30.0 - 36.0 g/dL   RDW 13.9 11.5 - 15.5 %   Platelets 282 150 - 400 K/uL  APTT  (IF patient is taking Pradaxa)  Result Value Ref Range   aPTT 34 24 - 36 seconds  Protime-INR (if pt is taking coumadin)  Result Value Ref Range   Prothrombin Time 13.8 11.4 - 15.2 seconds   INR 1.05    Laboratory  interpretation all normal except Hyperglycemia    EKG  EKG Interpretation  Date/Time:  Saturday June 13 2016 23:40:17 EST Ventricular Rate:  135 PR Interval:    QRS Duration: 85 QT Interval:  338 QTC Calculation: 507 R Axis:   28 Text Interpretation:  Atrial fibrillation Probable LVH with secondary repol abnrm ST depression, consider ischemia, diffuse lds Prolonged QT interval Baseline wander in lead(s) II III aVR aVL aVF V3 V4 Since last tracing rate faster 07 Jun 2016 Atrial fibrillation has replaced Normal sinus rhythm Confirmed by Kamsiyochukwu Spickler  MD-I, Aleem Elza ([id number]) on 06/13/2016 11:47:12 PM       EKG Interpretation  Date/Time:  Saturday June 13 2016 23:40:17 EST Ventricular Rate:  135 PR Interval:    QRS Duration: 85 QT Interval:  338 QTC Calculation: 507 R Axis:   28 Text Interpretation:  Atrial fibrillation Probable LVH with secondary repol abnrm ST depression, consider ischemia, diffuse lds Prolonged QT interval Baseline wander in lead(s) II III aVR aVL aVF V3 V4 Since last tracing rate faster 07 Jun 2016 Atrial fibrillation has replaced Normal sinus rhythm Confirmed by Kienan Doublin  MD-I, Kamari Bilek ([id number]) on 06/13/2016 11:47:12 PM       EKG Interpretation  Date/Time:  Sunday June 14 2016 03:10:12 EST Ventricular Rate:  38 PR Interval:    QRS Duration: 95 QT Interval:  486 QTC Calculation: 387 R Axis:   35 Text Interpretation:  Junctional rhythm Repol abnrm, severe global ischemia (LM/MVD) Baseline wander in lead(s) II aVF V1 Since last tracing of earlier today Junctional bradycardia has replaced atrial flutter Confirmed by Lessa Huge  MD-I, Wilma Wuthrich ([id number]) on 06/14/2016 3:27:40 AM       EKG Interpretation  Date/Time:  Sunday June 14 2016 03:15:32 EST Ventricular Rate:  51 PR Interval:    QRS Duration: 91 QT Interval:  471 QTC Calculation: 434 R Axis:   35 Text Interpretation:  Sinus rhythm Atrial premature complex Repol abnrm suggests ischemia, anterolateral Baseline  wander in lead(s) V3 Since last tracing Sinus bradycardia has replaced Junctional bradycardia Confirmed by Lovey Crupi  MD-I, Zelina Jimerson ([id number]) on 06/14/2016 3:29:19 AM        Radiology Dg Chest Port 1 View  Result Date: 06/14/2016 CLINICAL DATA:  Chest pain and shortness of breath EXAM: PORTABLE CHEST 1 VIEW COMPARISON:  Chest radiograph 06/07/2016 FINDINGS: Cardiomediastinal silhouette is mildly enlarged. There are bilateral peribronchial opacities, greatest near the right hilum. No pleural effusion or pneumothorax. IMPRESSION: Mild cardiomegaly and mild pulmonary edema. Electronically Signed   By: Ulyses Jarred M.D.   On: 06/14/2016 00:08    Procedures Procedures (including critical care time)   CRITICAL CARE Performed by: Charlet Harr L Taimane Stimmel Total critical care time: 48  minutes Critical care time was exclusive of separately billable procedures and treating other patients. Critical care was necessary to treat or prevent imminent or life-threatening deterioration. Critical care was time spent personally by me on the following activities: development of  treatment plan with patient and/or surrogate as well as nursing, discussions with consultants, evaluation of patient's response to treatment, examination of patient, obtaining history from patient or surrogate, ordering and performing treatments and interventions, ordering and review of laboratory studies, ordering and review of radiographic studies, pulse oximetry and re-evaluation of patient's condition.   Medications Ordered in ED Medications  diltiazem (CARDIZEM) 1 mg/mL load via infusion 10 mg (10 mg Intravenous Bolus from Bag 06/14/16 0005)    And  diltiazem (CARDIZEM) 100 mg in dextrose 5 % 100 mL (1 mg/mL) infusion (0 mg/hr Intravenous Stopped 06/14/16 0321)  diltiazem (CARDIZEM) 25 MG/5ML injection (not administered)  nitroGLYCERIN (NITROGLYN) 2 % ointment 1 inch (1 inch Topical Not Given 06/14/16 0321)  atropine 1 MG/ML injection (not  administered)  atropine injection 1 mg (1 mg Intravenous Given 06/14/16 0324)  atropine injection 0.5 mg (0.5 mg Intravenous Given 06/14/16 0325)  atropine injection 0.5 mg (0.5 mg Intravenous Given 06/14/16 0329)      Initial Impression / Assessment and Plan / ED Course  I have reviewed the triage vital signs and the nursing notes.  Pertinent labs & imaging results that were available during my care of the patient were reviewed by me and considered in my medical decision making (see chart for details).  Clinical Course    Patient was started on a Cardizem bolus and drip for her atrial fibrillation fast ventricular response. At 15 mg per hour patient still had a heart rate in the 120s. Her Cardizem was titrated up to 30 mg per hour.   01:15 HR 063, BP 016 systolic  0:10 AM patient noted to be in atrial flutter with 4-1 block. She states her chest pain is improving. Her blood pressure is normal.  I had talked to Dr. Olevia Bowens at 3 AM who related there was no cardiology coverage is weakend.  3:05 AM I spoke to Dr. Baruch Merl, cardiologist on-call, he states that patient is stable this point and he feels like she could be admitted by the hospitalist service and they can consult as needed.  As soon as I hung up the phone with Dr. Aline Brochure I was called into the patient's room again. Nurses report she had acute onset of bradycardia with lowest heart rate in the 20s on the monitor. The Cardizem drip was stopped. The nitroglycerin paste was removed. Patient is very pale, she states she has a very uneasy feeling in her upper chest. The monitor appears to show a junctional bradycardia in the 30's. She was given atropine 1 mg IV and her heart rate improved to 55. Her lowest blood pressure was 99 systolic. . At 318 she was given a atropine 0.5 mg IV when her heart rate dropped to 49. External pacemaker pads were placed on the patient.  3:20 AM Dr. Baruch Merl, cardiologist has accepted patient in  transfer to [facility] to the ICU.   4 AM blood pressure 123/57 heart rate 68. Patient denies chest pain, shortness of breath, lightheadedness or dizziness, nausea.  4:10 AM EMS is here to transport patient to [facility]. Report given on her ED course.  Final Clinical Impressions(s) / ED Diagnoses   Final diagnoses:  Chronic atrial fibrillation with rapid ventricular response (HCC)  Junctional bradycardia  Chest pain, unspecified type    Plan patient transferred to [facility] for admission  Rolland Porter, MD, FACEP    I personally performed the services described in this documentation, which was scribed in my presence. The recorded  information has been reviewed and considered.  Rolland Porter, MD, Barbette Or, MD 06/14/16 [phone]

## 2016-06-14 DIAGNOSIS — I2510 Atherosclerotic heart disease of native coronary artery without angina pectoris: Secondary | ICD-10-CM

## 2016-06-14 DIAGNOSIS — Z794 Long term (current) use of insulin: Secondary | ICD-10-CM | POA: Diagnosis not present

## 2016-06-14 DIAGNOSIS — E1151 Type 2 diabetes mellitus with diabetic peripheral angiopathy without gangrene: Secondary | ICD-10-CM | POA: Diagnosis not present

## 2016-06-14 DIAGNOSIS — R001 Bradycardia, unspecified: Secondary | ICD-10-CM

## 2016-06-14 DIAGNOSIS — I252 Old myocardial infarction: Secondary | ICD-10-CM | POA: Diagnosis not present

## 2016-06-14 DIAGNOSIS — I110 Hypertensive heart disease with heart failure: Secondary | ICD-10-CM | POA: Diagnosis not present

## 2016-06-14 DIAGNOSIS — R279 Unspecified lack of coordination: Secondary | ICD-10-CM | POA: Diagnosis not present

## 2016-06-14 DIAGNOSIS — Z90722 Acquired absence of ovaries, bilateral: Secondary | ICD-10-CM | POA: Diagnosis not present

## 2016-06-14 DIAGNOSIS — Z955 Presence of coronary angioplasty implant and graft: Secondary | ICD-10-CM | POA: Diagnosis not present

## 2016-06-14 DIAGNOSIS — I25700 Atherosclerosis of coronary artery bypass graft(s), unspecified, with unstable angina pectoris: Secondary | ICD-10-CM | POA: Diagnosis not present

## 2016-06-14 DIAGNOSIS — R079 Chest pain, unspecified: Secondary | ICD-10-CM | POA: Diagnosis not present

## 2016-06-14 DIAGNOSIS — Z7401 Bed confinement status: Secondary | ICD-10-CM | POA: Diagnosis not present

## 2016-06-14 DIAGNOSIS — E039 Hypothyroidism, unspecified: Secondary | ICD-10-CM | POA: Diagnosis not present

## 2016-06-14 DIAGNOSIS — K219 Gastro-esophageal reflux disease without esophagitis: Secondary | ICD-10-CM | POA: Diagnosis not present

## 2016-06-14 DIAGNOSIS — Z8673 Personal history of transient ischemic attack (TIA), and cerebral infarction without residual deficits: Secondary | ICD-10-CM | POA: Diagnosis not present

## 2016-06-14 DIAGNOSIS — L930 Discoid lupus erythematosus: Secondary | ICD-10-CM | POA: Diagnosis not present

## 2016-06-14 DIAGNOSIS — I4891 Unspecified atrial fibrillation: Secondary | ICD-10-CM

## 2016-06-14 DIAGNOSIS — Z8249 Family history of ischemic heart disease and other diseases of the circulatory system: Secondary | ICD-10-CM | POA: Diagnosis not present

## 2016-06-14 DIAGNOSIS — Z880 Allergy status to penicillin: Secondary | ICD-10-CM | POA: Diagnosis not present

## 2016-06-14 DIAGNOSIS — I480 Paroxysmal atrial fibrillation: Secondary | ICD-10-CM | POA: Diagnosis not present

## 2016-06-14 DIAGNOSIS — Z885 Allergy status to narcotic agent status: Secondary | ICD-10-CM | POA: Diagnosis not present

## 2016-06-14 DIAGNOSIS — E785 Hyperlipidemia, unspecified: Secondary | ICD-10-CM | POA: Diagnosis not present

## 2016-06-14 DIAGNOSIS — Z79899 Other long term (current) drug therapy: Secondary | ICD-10-CM | POA: Diagnosis not present

## 2016-06-14 DIAGNOSIS — Z7902 Long term (current) use of antithrombotics/antiplatelets: Secondary | ICD-10-CM | POA: Diagnosis not present

## 2016-06-14 DIAGNOSIS — I4892 Unspecified atrial flutter: Secondary | ICD-10-CM | POA: Diagnosis not present

## 2016-06-14 DIAGNOSIS — I5031 Acute diastolic (congestive) heart failure: Secondary | ICD-10-CM | POA: Diagnosis not present

## 2016-06-14 DIAGNOSIS — Z7901 Long term (current) use of anticoagulants: Secondary | ICD-10-CM | POA: Diagnosis not present

## 2016-06-14 HISTORY — DX: Bradycardia, unspecified: R00.1

## 2016-06-14 LAB — GLUCOSE, CAPILLARY
GLUCOSE-CAPILLARY: 314 mg/dL — AB (ref 65–99)
GLUCOSE-CAPILLARY: 291 mg/dL — AB (ref 65–99)
GLUCOSE-CAPILLARY: 288 mg/dL — AB (ref 65–99)
Glucose-Capillary: 229 mg/dL — ABNORMAL HIGH (ref 65–99)

## 2016-06-14 LAB — BASIC METABOLIC PANEL
ANION GAP: 10 (ref 5–15)
BUN: 24 mg/dL — ABNORMAL HIGH (ref 6–20)
CO2: 23 mmol/L (ref 22–32)
Calcium: 9.5 mg/dL (ref 8.9–10.3)
Chloride: 106 mmol/L (ref 101–111)
Creatinine, Ser: 1.02 mg/dL — ABNORMAL HIGH (ref 0.44–1.00)
GFR calc Af Amer: >60 mL/min (ref >60)
GFR calc non Af Amer: 54 mL/min — ABNORMAL LOW (ref >60)
Glucose, Bld: 269 mg/dL — ABNORMAL HIGH (ref 65–99)
Potassium: 4 mmol/L (ref 3.5–5.1)
Sodium: 139 mmol/L (ref 135–145)

## 2016-06-14 LAB — HEPATIC FUNCTION PANEL
ALBUMIN: 3.5 g/dL (ref 3.5–5.0)
ALT: 15 U/L (ref 14–54)
AST: 14 U/L — ABNORMAL LOW (ref 15–41)
Alkaline Phosphatase: 77 U/L (ref 38–126)
BILIRUBIN DIRECT: 0.1 mg/dL (ref 0.1–0.5)
BILIRUBIN INDIRECT: 0.4 mg/dL (ref 0.3–0.9)
BILIRUBIN TOTAL: 0.5 mg/dL (ref 0.3–1.2)
Total Protein: 5.3 g/dL — ABNORMAL LOW (ref 6.5–8.1)

## 2016-06-14 LAB — TROPONIN I

## 2016-06-14 LAB — MRSA PCR SCREENING: MRSA BY PCR: NEGATIVE

## 2016-06-14 MED ORDER — ATORVASTATIN CALCIUM 80 MG PO TABS
80.0000 mg | ORAL_TABLET | Freq: Every day | ORAL | Status: DC
  Administered 2016-06-14: 80 mg via ORAL
  Filled 2016-06-14: qty 1

## 2016-06-14 MED ORDER — ONDANSETRON HCL 4 MG/2ML IJ SOLN: 4.0000 mg | Freq: Four times a day (QID) | INTRAMUSCULAR | Status: DC | PRN

## 2016-06-14 MED ORDER — ROPINIROLE HCL 0.5 MG PO TABS
0.5000 mg | ORAL_TABLET | Freq: Every day | ORAL | Status: DC
  Administered 2016-06-14: 0.5 mg via ORAL
  Filled 2016-06-14: qty 1

## 2016-06-14 MED ORDER — INSULIN ASPART 100 UNIT/ML ~~LOC~~ SOLN
0.0000 [IU] | Freq: Three times a day (TID) | SUBCUTANEOUS | Status: DC
  Administered 2016-06-14: 8 [IU] via SUBCUTANEOUS
  Administered 2016-06-14: 11 [IU] via SUBCUTANEOUS
  Administered 2016-06-14: 5 [IU] via SUBCUTANEOUS
  Administered 2016-06-15: 8 [IU] via SUBCUTANEOUS

## 2016-06-14 MED ORDER — CLOPIDOGREL BISULFATE 75 MG PO TABS
75.0000 mg | ORAL_TABLET | Freq: Every day | ORAL | Status: DC
  Administered 2016-06-14 – 2016-06-15 (×2): 75 mg via ORAL
  Filled 2016-06-14 (×2): qty 1

## 2016-06-14 MED ORDER — POTASSIUM CHLORIDE CRYS ER 20 MEQ PO TBCR
20.0000 meq | EXTENDED_RELEASE_TABLET | Freq: Every day | ORAL | Status: DC
  Administered 2016-06-14 – 2016-06-15 (×2): 20 meq via ORAL
  Filled 2016-06-14 (×2): qty 1

## 2016-06-14 MED ORDER — AMLODIPINE BESYLATE 10 MG PO TABS
10.0000 mg | ORAL_TABLET | Freq: Every day | ORAL | Status: DC
Start: 2016-06-14 — End: 2016-06-15
  Administered 2016-06-14 – 2016-06-15 (×2): 10 mg via ORAL
  Filled 2016-06-14 (×2): qty 1

## 2016-06-14 MED ORDER — LABETALOL HCL 200 MG PO TABS
400.0000 mg | ORAL_TABLET | Freq: Two times a day (BID) | ORAL | Status: DC
  Administered 2016-06-14 – 2016-06-15 (×2): 400 mg via ORAL
  Filled 2016-06-14 (×3): qty 2

## 2016-06-14 MED ORDER — NITROGLYCERIN 2 % TD OINT
TOPICAL_OINTMENT | TRANSDERMAL | Status: AC
  Administered 2016-06-14: 1 [in_us] via TOPICAL
  Filled 2016-06-14: qty 1

## 2016-06-14 MED ORDER — LOSARTAN POTASSIUM 50 MG PO TABS
100.0000 mg | ORAL_TABLET | Freq: Every day | ORAL | Status: DC
  Administered 2016-06-14 – 2016-06-15 (×2): 100 mg via ORAL
  Filled 2016-06-14 (×2): qty 2

## 2016-06-14 MED ORDER — ATROPINE SULFATE 1 MG/ML IJ SOLN
0.5000 mg | Freq: Once | INTRAMUSCULAR | Status: AC
  Administered 2016-06-14: 0.5 mg via INTRAVENOUS

## 2016-06-14 MED ORDER — INSULIN GLARGINE 100 UNIT/ML ~~LOC~~ SOLN
25.0000 [IU] | Freq: Every day | SUBCUTANEOUS | Status: DC
  Administered 2016-06-14: 25 [IU] via SUBCUTANEOUS
  Filled 2016-06-14: qty 0.25

## 2016-06-14 MED ORDER — ATROPINE SULFATE 1 MG/ML IJ SOLN
1.0000 mg | Freq: Once | INTRAMUSCULAR | Status: AC
  Administered 2016-06-14: 1 mg via INTRAVENOUS

## 2016-06-14 MED ORDER — APIXABAN 5 MG PO TABS
5.0000 mg | ORAL_TABLET | Freq: Two times a day (BID) | ORAL | Status: DC
  Administered 2016-06-14 – 2016-06-15 (×3): 5 mg via ORAL
  Filled 2016-06-14 (×3): qty 1

## 2016-06-14 MED ORDER — LEVOTHYROXINE SODIUM 75 MCG PO TABS
150.0000 ug | ORAL_TABLET | Freq: Every day | ORAL | Status: DC
  Administered 2016-06-14 – 2016-06-15 (×2): 150 ug via ORAL
  Filled 2016-06-14 (×2): qty 2

## 2016-06-14 MED ORDER — FUROSEMIDE 10 MG/ML IJ SOLN
40.0000 mg | Freq: Two times a day (BID) | INTRAMUSCULAR | Status: DC
  Administered 2016-06-14 – 2016-06-15 (×3): 40 mg via INTRAVENOUS
  Filled 2016-06-14 (×3): qty 4

## 2016-06-14 MED ORDER — NITROGLYCERIN 2 % TD OINT
1.0000 [in_us] | TOPICAL_OINTMENT | Freq: Four times a day (QID) | TRANSDERMAL | Status: DC
  Administered 2016-06-14: 1 [in_us] via TOPICAL

## 2016-06-14 MED ORDER — INSULIN ASPART 100 UNIT/ML ~~LOC~~ SOLN
4.0000 [IU] | Freq: Three times a day (TID) | SUBCUTANEOUS | Status: DC
  Administered 2016-06-14 – 2016-06-15 (×3): 4 [IU] via SUBCUTANEOUS

## 2016-06-14 MED ORDER — FUROSEMIDE 40 MG PO TABS
40.0000 mg | ORAL_TABLET | Freq: Two times a day (BID) | ORAL | Status: DC
  Administered 2016-06-14: 40 mg via ORAL
  Filled 2016-06-14: qty 1

## 2016-06-14 MED ORDER — INSULIN ASPART 100 UNIT/ML ~~LOC~~ SOLN
0.0000 [IU] | Freq: Every day | SUBCUTANEOUS | Status: DC
  Administered 2016-06-14: 3 [IU] via SUBCUTANEOUS

## 2016-06-14 MED ORDER — MAGNESIUM OXIDE 400 (241.3 MG) MG PO TABS
400.0000 mg | ORAL_TABLET | Freq: Every day | ORAL | Status: DC
  Administered 2016-06-14 – 2016-06-15 (×2): 400 mg via ORAL
  Filled 2016-06-14 (×2): qty 1

## 2016-06-14 MED ORDER — ATROPINE SULFATE 1 MG/ML IJ SOLN
INTRAMUSCULAR | Status: AC
  Filled 2016-06-14: qty 1

## 2016-06-14 NOTE — Progress Notes (Addendum)
Unk Lightning, Case Mgmt called regarding need of RW for pt. Message left. Will await return call.

## 2016-06-14 NOTE — Evaluation (Signed)
Physical Therapy Evaluation & Discharge Patient Details Name: Virginia Huber MRN: [id number] DOB: 27-Aug-1945 Today's Date: 06/14/2016   History of Present Illness   Virginia Huber  is a 70 y.o. female, With history of A. fib on eliquis, coronary artery disease on Plavix, hypertension, diabetes mellitus who came to the hospital after patient started having chest pain associated with palpitations.  Found to have junctional rhythm but converted to SR.  Clinical Impression  Patient presents with decreased balance/stability during ambulation.  Feel more stable, confident with RW, but initially running into obstacles due to L side vision loss.  Feel she will progress on her own at d/c and no current follow up PT needs at this time.  Will need a walker for home.     Follow Up Recommendations No PT follow up    Equipment Recommendations  Rolling walker with 5" wheels    Recommendations for Other Services       Precautions / Restrictions Precautions Precautions: Fall      Mobility  Bed Mobility Overal bed mobility: Needs Assistance Bed Mobility: Supine to Sit;Sit to Supine     Supine to sit: HOB elevated;Modified independent (Device/Increase time) Sit to supine: Min guard   General bed mobility comments: assist due to lines, for positioning  Transfers Overall transfer level: Needs assistance Equipment used: None Transfers: Sit to/from Stand Sit to Stand: Supervision         General transfer comment: no physical assist, S for safety with lines  Ambulation/Gait Ambulation/Gait assistance: Min guard;Supervision Ambulation Distance (Feet): 225 Feet Assistive device: None;Rolling walker (2 wheeled) Gait Pattern/deviations: Step-through pattern;Drifts right/left;Decreased stride length     General Gait Details: initially no device, noted veering to sides esp with head turns, improved safety and confidence with RW remainder of walk, cues to prevent running into obstacles with  RW  Stairs Stairs: Yes Stairs assistance: Min guard Stair Management: Step to pattern;One rail Right;Forwards Number of Stairs: 2 General stair comments: assist for safety/stability  Wheelchair Mobility    Modified Rankin (Stroke Patients Only)       Balance Overall balance assessment: Needs assistance   Sitting balance-Leahy Scale: Good       Standing balance-Leahy Scale: Good Standing balance comment: turns to look over shoulders with S, step taps forward with S to minguard                             Pertinent Vitals/Pain Pain Assessment: No/denies pain    Home Living Family/patient expects to be discharged to:: Private residence Living Arrangements: Spouse/significant other Available Help at Discharge: Family Type of Home: House Home Access: Stairs to enter Entrance Stairs-Rails: Psychiatric nurse of Steps: 2 Home Layout: Two level Home Equipment: Shower seat - built in      Prior Function Level of Independence: Independent               Journalist, newspaper        Extremity/Trunk Assessment   Upper Extremity Assessment Upper Extremity Assessment: Overall WFL for tasks assessed    Lower Extremity Assessment Lower Extremity Assessment: Generalized weakness       Communication   Communication: No difficulties  Cognition Arousal/Alertness: Awake/alert Behavior During Therapy: WFL for tasks assessed/performed Overall Cognitive Status: Within Functional Limits for tasks assessed                      General Comments General comments (skin  integrity, edema, etc.): Educated on fall prevention with lighting, footwear, removing throw rugs; pt relates she lost vision in L eye     Exercises     Assessment/Plan    PT Assessment Patent does not need any further PT services  PT Problem List            PT Treatment Interventions      PT Goals (Current goals can be found in the Care Plan section)  Acute Rehab  PT Goals PT Goal Formulation: All assessment and education complete, DC therapy    Frequency     Barriers to discharge        Co-evaluation               End of Session Equipment Utilized During Treatment: Gait belt Activity Tolerance: Patient tolerated treatment well Patient left: in bed;with call bell/phone within reach;with family/visitor present;with nursing/sitter in room      Functional Assessment Tool Used: Clinical Judgement Functional Limitation: Mobility: Walking and moving around Mobility: Walking and Moving Around Current Status (P2162): At least 1 percent but less than 20 percent impaired, limited or restricted Mobility: Walking and Moving Around Goal Status [phone]): At least 1 percent but less than 20 percent impaired, limited or restricted Mobility: Walking and Moving Around Discharge Status [phone]): At least 1 percent but less than 20 percent impaired, limited or restricted    Time: 7505-1833 PT Time Calculation (min) (ACUTE ONLY): 24 min   Charges:   PT Evaluation $PT Eval Moderate Complexity: 1 Procedure PT Treatments $Gait Training: 8-22 mins   PT G Codes:   PT G-Codes **NOT FOR INPATIENT CLASS** Functional Assessment Tool Used: Clinical Judgement Functional Limitation: Mobility: Walking and moving around Mobility: Walking and Moving Around Current Status (P8251): At least 1 percent but less than 20 percent impaired, limited or restricted Mobility: Walking and Moving Around Goal Status [phone]): At least 1 percent but less than 20 percent impaired, limited or restricted Mobility: Walking and Moving Around Discharge Status [phone]): At least 1 percent but less than 20 percent impaired, limited or restricted    Reginia Naas 06/14/2016, 3:47 PM  Magda Kiel, [place] 06/14/2016

## 2016-06-14 NOTE — ED Notes (Signed)
PT became acutely bradycardic with dizziness, diaphoresis. EKG captured,  Cardizem stopped, NS bolus and 1 mg of atropine Given per Dr. Tomi Bamberger. RN and Dr. Tomi Bamberger at bedside.

## 2016-06-14 NOTE — H&P (Addendum)
CARDIOLOGY HISTORY AND PHYSICAL     Date: 06/14/2016 Admitting Physician: Lowella Dandy, MD  Chief Complaint:  Palpitations ____________________________________________________________________ History of Present Illness:  Virginia Huber is a 70 y.o. old female with medical history noted below originally presented to the OSH with complaints of chest pain and palpitations.  She has a history of atrial fibrillation and she is quite symptomatic with chest discomfort and palpitations when this occurs.  She states she used to have these episodes every 6-8 months, but recently has become more frequent.  While in the ED she her ventricular rate was noted to be 135.  She was started on a diltiazem infusion and shortly after converted to typical flutter with 4:1 conduction.  At this point her chest discomfort had resolved.  She then suddenly became bradycardic with rates in the 30s.  ECG performed show junctional rhythm.  During this time she was lightheaded and dizzy and overall "felt terrible".  She was given atropine and her heart rate gradually increased back to a sinus rate in the 60s.  She was transferred here for further evaluation.  She has never had a episode like this before.  No previous syncope.  On anticoagulation.    Review of Systems:   Review of Systems:  GEN: no fever, chills, nausea, vomiting, weight change  HEENT: no vision or hearing changes  PULM: no coughing, SOB  CV: no +chest pain, +palpitations, PND, orthopnea  GI: no abdominal pain  GU: no dysuria  EXT: no swelling  SKIN: no rashes  NEURO: no numbness or tingling  HEME: no bleeding or bruising  GYN: none  --12 point review systems- otherwise negative.  All other systems reviewed and are negative  Past Medical History:  Diagnosis Date  . Arthritis   . Atrial fibrillation and flutter ([place])    a. h/o PAF/flutter during admission in 2013 for PNA. b. PAF during adm for NSTEMI 07/2015.  [name] B12 deficiency anemia   .  Coronary artery disease 11/30/2014   a. remote MI. b. h/o PTCA with scoring balloon to OM1 11/2014. c. NSTEMI 03/2015 s/p DES to prox-mid Cx. d. NSTEMI 07/2015 s/p scoring balloon/PTCA/DES to dRCA with PAF during that admission  . Cutaneous lupus erythematosus   . GERD (gastroesophageal reflux disease)   . Heart block   . History of blood transfusion 1980's   2nd surgical procedures  . HTN (hypertension)   . Hypercholesteremia   . Hypothyroidism   . Myocardial infarction 02/2012  . Ovarian tumor   . PAD (peripheral artery disease) ([place])    a. s/p LE angio 2015; followed by Dr. Fletcher Anon - managed medically.  . Pain with urination 05/08/2015  . Superficial fungus infection of skin 06/29/2013  . TIA (transient ischemic attack) 08/2001; ~ 2006  . Type II diabetes mellitus ([place])   . UTI (urinary tract infection) 05/08/2013    Past Surgical History:  Procedure Laterality Date  . ABDOMINAL AORTAGRAM N/A 01/03/2014   Procedure: ABDOMINAL [name];  Surgeon: Wellington Hampshire, MD;  Location: [facility] CATH LAB;  Service: Cardiovascular;  Laterality: N/A;  . ABDOMINAL HYSTERECTOMY  1972   "partial"  . APPENDECTOMY  1970's  . CARDIAC CATHETERIZATION  2008   Tiny OM-2 with 90% narrowing. Med tx.  [name] CARDIAC CATHETERIZATION N/A 11/30/2014   Procedure: Left Heart Cath and Coronary Angiography;  Surgeon: Troy Sine, MD; LAD 20%, CFX 50%, OM1 95%, right PLB 30%, LV normal   . CARDIAC CATHETERIZATION N/A 11/30/2014  Procedure: Coronary Balloon Angioplasty;  Surgeon: Troy Sine, MD;  Angiosculpt scoring balloon and PTCA to the OM1 reducing stenosis from 95% to less than 10%  . CARDIAC CATHETERIZATION N/A 04/03/2015   Procedure: Left Heart Cath and Coronary Angiography;  Surgeon: Jolaine Artist, MD; dLAD 50%, CFX 90%, OM1 100%, PLA 15%, LVEDP 13    . CARDIAC CATHETERIZATION N/A 04/03/2015   Procedure: Coronary Stent Intervention;  Surgeon: Sherren Mocha, MD; 3.0x18 mm Xience DES to the CFX    . CARDIAC  CATHETERIZATION N/A 08/02/2015   Procedure: Left Heart Cath and Coronary Angiography;  Surgeon: Troy Sine, MD;  Location: [facility] CV [facility];  Service: Cardiovascular;  Laterality: N/A;  . CARDIAC CATHETERIZATION N/A 08/02/2015   Procedure: Coronary Stent Intervention;  Surgeon: Troy Sine, MD;  Location: [facility] CV LAB;  Service: Cardiovascular;  Laterality: N/A;  . cardiac stents    . CHOLECYSTECTOMY OPEN  1990's  . COLONOSCOPY  2005   Dr. Laural Golden: pancolonic divericula, polyp, path unknown currently  . COLONOSCOPY  2012   Dr. Oneida Alar: Normal TI, scattered diverticula in entire colon, small internal hemorrhoids, normal colon biopsies. Colonoscopy in 5-10 years.   . COLOSTOMY  05/1979  . COLOSTOMY REVERSAL  11/1979  . EXCISIONAL HEMORRHOIDECTOMY  1970's  . EYE SURGERY Left 2000   "branch vein occlusion"  . EYE SURGERY Left ~ 2001   "smoothed out wrinkle"  . LEFT OOPHORECTOMY  05/1979   nicked bowel, peritonitis, colostomy; colostomy reversed 1981   . LOWER EXTREMITY ANGIOGRAM N/A 01/03/2014   Procedure: LOWER EXTREMITY ANGIOGRAM;  Surgeon: Wellington Hampshire, MD;  Location: [facility] CATH LAB;  Service: Cardiovascular;  Laterality: N/A;  . Nuclear med stress test  10/2011   Small area of mild ischemia inferoapically.  [name] PARTIAL HYSTERECTOMY  1970's   left ovaries, then ovaries removed later due tumors   . RIGHT OOPHORECTOMY  1970's    Past Cardiovascular History:  +CAD - No documented h/o MI +CHF - No documented h/o PVD - No documented h/o AAA - No documented h/o valvular heart disease - No documented h/o CVA - No documented h/o Arrhythmias +A-fib  - No documented h/o congenital heart disease - No documented h/o CABG +PCI - No documented h/o cardiac devices (Pacer/ICD/CRT) - No documented h/o cardiac surgery       Most recent stress test:  None  Most recent echocardiography:   05/24/15 - Left ventricle: The cavity size was normal. Wall thickness was   increased in a  pattern of mild LVH. Systolic function was normal.   The estimated ejection fraction was in the range of 55% to 60%.   Wall motion was normal; there were no regional wall motion   abnormalities. Features are consistent with a pseudonormal left   ventricular filling pattern, with concomitant abnormal relaxation   and increased filling pressure (grade 2 diastolic dysfunction). - Aortic valve: Mildly calcified annulus. Trileaflet; mildly   thickened leaflets. Valve area (VTI): 2.07 cm^2. Valve area   (Vmax): 1.98 cm^2. - Mitral valve: Mildly calcified annulus. Mildly thickened leaflets   . - Left atrium: The atrium was severely dilated. - Atrial septum: No defect or patent foramen ovale was identified. - Technically adequate study.  Most recent left heart catheterization:   08/02/15  1st RPLB lesion, 50% stenosed.  Dist RCA lesion, 30% stenosed.  Ost 1st Mrg to 1st Mrg lesion, 100% stenosed.  Ost LAD lesion, 40% stenosed.  Mid LAD lesion, 40% stenosed.  Post Atrio lesion, 90% stenosed. Post intervention, there is a 0% residual stenosis.  The left ventricular systolic function is normal.  Mid RCA lesion, 20% stenosed.  CABG:  Date/ Physician: None  Device history:  None  Prior to Admission medications   Medication Sig Start Date End Date Taking? Authorizing Provider  amLODipine (NORVASC) 10 MG tablet TAKE ONE (1) TABLET EACH DAY 07/26/15   Herminio Commons, MD  apixaban (ELIQUIS) 5 MG TABS tablet Take 1 tablet (5 mg total) by mouth 2 (two) times daily. 12/16/15   Herminio Commons, MD  atorvastatin (LIPITOR) 80 MG tablet Take 1 tablet (80 mg total) by mouth daily at 6 PM. 04/27/16   Herminio Commons, MD  cholecalciferol (VITAMIN D) 1000 UNITS tablet Take 1,000 Units by mouth at bedtime.     Historical Provider, MD  clopidogrel (PLAVIX) 75 MG tablet Take 1 tablet (75 mg total) by mouth daily. 08/09/15   Dayna N Dunn, PA-C  diltiazem (CARDIZEM CD) 120 MG 24 hr capsule Take 1  capsule (120 mg total) by mouth daily. 06/10/16 09/08/16  Imogene Burn, PA-C  furosemide (LASIX) 20 MG tablet Take 2 tablets (40 mg total) by mouth 2 (two) times daily. 02/20/16   Herminio Commons, MD  insulin NPH-regular Human (NOVOLIN 70/30) (70-30) 100 UNIT/ML injection Inject 30-40 Units into the skin 2 (two) times daily with a meal. Inject 30 units with breakfast and 40 units with supper only if pre-meal blood glucose is above 90 mg/dL.    Historical Provider, MD  labetalol (NORMODYNE) 200 MG tablet Take 2 tablets (400 mg total) by mouth 2 (two) times daily. 12/30/15   Herminio Commons, MD  levothyroxine (SYNTHROID, LEVOTHROID) 150 MCG tablet Take 150 mcg by mouth daily before breakfast.    Historical Provider, MD  losartan (COZAAR) 100 MG tablet TAKE ONE (1) TABLET BY MOUTH EVERY DAY 06/10/16   Herminio Commons, MD  magnesium oxide (MAG-OX) 400 MG tablet Take 400 mg by mouth daily.    Historical Provider, MD  metFORMIN (GLUCOPHAGE) 500 MG tablet Take 500 mg by mouth 2 (two) times daily with a meal.    Historical Provider, MD  nitroGLYCERIN (NITROSTAT) 0.4 MG SL tablet Place 1 tablet (0.4 mg total) under the tongue every 5 (five) minutes as needed for chest pain. 04/04/15   Evelene Croon Barrett, PA-C  Omega-3 Fatty Acids (FISH OIL) 1200 MG CAPS Take 2 capsules (2,400 mg total) by mouth 2 (two) times daily. 04/27/16   Cassandria Anger, MD  potassium chloride SA (KLOR-CON M20) 20 MEQ tablet Take 1 tablet (20 mEq total) by mouth daily. 10/02/15   Isaiah Serge, NP  rOPINIRole (REQUIP) 0.5 MG tablet Take 0.5 mg by mouth at bedtime.  03/05/14   Historical Provider, MD  sodium chloride (OCEAN) 0.65 % SOLN nasal spray Place 1 spray into both nostrils as needed for congestion. 05/13/16   Larey Seat, MD    Allergies  Allergen Reactions  . Penicillins Hives    Has patient had a PCN reaction causing immediate rash, facial/tongue/throat swelling, SOB or lightheadedness with hypotension:  No Has patient had a PCN reaction causing severe rash involving mucus membranes or skin necrosis: No Has patient had a PCN reaction that required hospitalization No Has patient had a PCN reaction occurring within the last 10 years: No If all of the above answers are "NO", then may proceed with Cephalosporin use.  [name] Percocet [Oxycodone-Acetaminophen] Nausea And Vomiting  Social History:   Social History  Substance Use Topics  . Smoking status: Never Smoker  . Smokeless tobacco: Never Used     Comment: Never smoked  . Alcohol use No    Family History  Problem Relation Age of Onset  . Heart disease Mother     deceased  . Heart disease Father     deceased, heart disease  . Diabetes Brother   . Heart disease Brother   . Thyroid disease Brother   . Heart disease Sister   . Heart disease Brother   . Thyroid disease Brother   . Lupus Daughter   . Colon cancer Neg Hx     Physical Examination: Blood pressure (!) 117/41, pulse 64, temperature 98.3 F (36.8 C), temperature source Oral, resp. rate (!) 24, height 5\' 3"  (1.6 m), weight 67.6 kg (149 lb), SpO2 99 %. General:  AAOX 4.  NAD.  NRD.  HENT: Normocephalic. Atraumatic.  No acute abnom. EYES: PERRL EOMI  Neck: Supple.  No JVD.  No bruits. Cardiovascular:  Nl S1. Nl S2. No S3. No S4. Nl PMI. No m/r/c. RRR  Pulmonary/Chest: CTA B. No rales. No wheezing.  Abdomen: Soft, NT, no masses, no organomegaly. Neuro: CN intact, no motor/sensory deficit.  Ext: Warm. No edema.  SKIN- intact  No intake or output data in the 24 hours ending 06/14/16 0540  Troponin (Point of Care Test) No results for input(s): TROPIPOC in the last 72 hours. ____________________________________________________________________ Assessment/Plan  Atrial fibrillation   Assessment:  Patient with history of atrial fib now with likely post conversion sinus arrest resulting in junctional rhythm and near syncope.  Currently in sinus and feeling much better.   Unfortunately strips of the termination of flutter where not sent.  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  She should also be considered for rhythm control via antiarrythmics vs ablation.  CHADS2-Vasc score of 5 giving her an annual stroke risk of 6.7%.     Plan  -  Will watch overnight  -  No need for temp wire for now  -  Continue beta blockade, but will hold her home cardizem  -  Continue eliquis  -  Needs referral to EP as an outpatient  -  If she has further pauses while here, will need to stay for pacemaker placement  CAD   Assessment:  Currently pain free now that her heart rates are under control.  Will continue to monitor.  Will elect to not check troponins given her pain was associated with RVR.  If she develops chest pain while in sinus, will reconsider a rule out.   Plan  -  Continue to monitor  -  ECHO  Diabetes   Assessment:  Continue diabetes management with lantus and SSI   Plan  -  Lantus 25 units QHS  -  SSI  Electronically signed by Lowella Dandy 06/14/2016 Baruch Merl, MD, PhD Cardiology

## 2016-06-14 NOTE — Progress Notes (Signed)
Patient Name: Virginia Huber Date of Encounter: 06/14/2016  Active Problems:   Atrial fibrillation with RVR ([place])   Bradycardia   Length of Stay: 0  SUBJECTIVE  The patient feels better, still tired, chest pain has resolved.   CURRENT MEDS . amLODipine  10 mg Oral Daily  . apixaban  5 mg Oral BID  . atorvastatin  80 mg Oral q1800  . atropine      . clopidogrel  75 mg Oral Daily  . diltiazem      . furosemide  40 mg Oral BID  . insulin aspart  0-15 Units Subcutaneous TID WC  . insulin aspart  0-5 Units Subcutaneous QHS  . insulin aspart  4 Units Subcutaneous TID WC  . insulin glargine  25 Units Subcutaneous QHS  . labetalol  400 mg Oral BID  . levothyroxine  150 mcg Oral QAC breakfast  . losartan  100 mg Oral Daily  . magnesium oxide  400 mg Oral Daily  . potassium chloride SA  20 mEq Oral Daily  . rOPINIRole  0.5 mg Oral QHS    OBJECTIVE  Vitals:   06/14/16 0630 06/14/16 0700 06/14/16 0800 06/14/16 0820  BP:  (!) 130/53 (!) 115/46   Pulse: 65 65 62   Resp: (!) 21 (!) 25 (!) 25   Temp:    98.3 F (36.8 C)  TempSrc:    Oral  SpO2: 99% 97% 92%   Weight:      Height:        Intake/Output Summary (Last 24 hours) at 06/14/16 0900 Last data filed at 06/14/16 0800  Gross per 24 hour  Intake              640 ml  Output              700 ml  Net              -60 ml   Filed Weights   06/13/16 2336 06/14/16 0545  Weight: 149 lb (67.6 kg) 156 lb 8.4 oz (71 kg)    PHYSICAL EXAM  General: Pleasant, NAD. Neuro: Alert and oriented X 3. Moves all extremities spontaneously. Psych: Normal affect. HEENT:  Normal  Neck: Supple without bruits or JVD. Lungs:  Resp regular and unlabored, crackles at the basis. Heart: RRR no s3, s4, or murmurs. Abdomen: Soft, non-tender, non-distended, BS + x 4.  Extremities: No clubbing, cyanosis or edema. DP/PT/Radials 2+ and equal bilaterally.  Accessory Clinical Findings  CBC  Recent Labs  06/13/16 2339  WBC 10.6*  HGB  12.6  HCT 37.4  MCV 92.1  PLT 035   Basic Metabolic Panel  Recent Labs  06/13/16 2339  NA 139  K 4.0  CL 106  CO2 23  GLUCOSE 269*  BUN 24*  CREATININE 1.02*  CALCIUM 9.5    Recent Labs  06/13/16 2339 06/14/16 0300  TROPONINI <0.03 <0.03   Radiology/Studies  Dg Chest 2 View  Result Date: 06/07/2016 CLINICAL DATA:  Initial evaluation for acute chest pain, shortness of breath. EXAM: CHEST  2 VIEW COMPARISON:  Prior radiograph from 10/28/2015. FINDINGS: The cardiac and mediastinal silhouettes are stable in size and contour, and remain within normal limits. Mild elevation the right hemidiaphragm, stable. No airspace consolidation, pleural effusion, or pulmonary edema is identified. There is no pneumothorax. No acute osseous abnormality identified. IMPRESSION: No active cardiopulmonary disease. Electronically Signed   By: Jeannine Boga M.D.   On: 06/07/2016 02:05  Dg Chest Port 1 View  Result Date: 06/14/2016 CLINICAL DATA:  Chest pain and shortness of breath EXAM: PORTABLE CHEST 1 VIEW COMPARISON:  Chest radiograph 06/07/2016 FINDINGS: Cardiomediastinal silhouette is mildly enlarged. There are bilateral peribronchial opacities, greatest near the right hilum. No pleural effusion or pneumothorax. IMPRESSION: Mild cardiomegaly and mild pulmonary edema. Electronically Signed   By: Ulyses Jarred M.D.   On: 06/14/2016 00:08   TELE: a-fib --> SR  TTE: 05/24/2015 - Left ventricle: The cavity size was normal. Wall thickness was   increased in a pattern of mild LVH. Systolic function was normal.   The estimated ejection fraction was in the range of 55% to 60%.   Wall motion was normal; there were no regional wall motion   abnormalities. Features are consistent with a pseudonormal left   ventricular filling pattern, with concomitant abnormal relaxation   and increased filling pressure (grade 2 diastolic dysfunction). - Aortic valve: Mildly calcified annulus. Trileaflet;  mildly   thickened leaflets. Valve area (VTI): 2.07 cm^2. Valve area   (Vmax): 1.98 cm^2. - Mitral valve: Mildly calcified annulus. Mildly thickened leaflets   . - Left atrium: The atrium was severely dilated. - Atrial septum: No defect or patent foramen ovale was identified. - Technically adequate study.   ASSESSMENT AND PLAN  Atrial fibrillation - paroxysmal   Assessment:  Patient with history of atrial fib now with likely post conversion sinus arrest resulting in junctional rhythm and near syncope.  Currently in sinus and feeling much better.   She was given iv Cardizem and went from a-fib with RVR to atrial flutter to postconversion pause of 5.6 seconds, to junctional rhythm, currently in SR, feeling whole lot better but not to baseline. She has been on Eliquis, we will continue. - she has had several very symptomatic episodes, we will refer to EP clinic at discharge  2. Chest pain - only while in a-fib with RVR, now resolved, troponin negative x 3, the last cath in 07/2015, no ischemia workup indicated at this point  3. Acute diastolic CHF - sec to a-fib with RVR - she is mildly fluid overloaded on physical exam, baseline weight 150 lbs, today 156 lbs, I will give one dose of lasix 40 mg iv, she is encourage to start walking, if she feels better this afternoon, she could go home with an early follow up, otherwise we will plan on discharge in the am.  The patient was seen by a fellow on call, however required extra time for a critical decision making, total time spent with the patient 35 minutes.   Signed, Ena Dawley MD, [facility] 06/14/2016

## 2016-06-14 NOTE — Progress Notes (Addendum)
Patient ambulated in halls with contact assist. Patient shaky on her feet, veering off to one side. States having trouble at home ambulating and feeling week. HR 60-70s, mid 90s on RA. No complaints of dizziness, SOB, or light headedness.  PT consult placed.

## 2016-06-15 LAB — BASIC METABOLIC PANEL
Anion gap: 8 (ref 5–15)
BUN: 17 mg/dL (ref 6–20)
CO2: 26 mmol/L (ref 22–32)
Calcium: 9 mg/dL (ref 8.9–10.3)
Chloride: 107 mmol/L (ref 101–111)
Creatinine, Ser: 0.9 mg/dL (ref 0.44–1.00)
GFR calc Af Amer: >60 mL/min (ref >60)
GLUCOSE: 236 mg/dL — AB (ref 65–99)
POTASSIUM: 3.7 mmol/L (ref 3.5–5.1)
Sodium: 141 mmol/L (ref 135–145)

## 2016-06-15 LAB — GLUCOSE, CAPILLARY: Glucose-Capillary: 272 mg/dL — ABNORMAL HIGH (ref 65–99)

## 2016-06-15 LAB — CBC
HCT: 36.4 % (ref 36.0–46.0)
HEMOGLOBIN: 11.8 g/dL — AB (ref 12.0–15.0)
MCH: 29.6 pg (ref 26.0–34.0)
MCHC: 32.4 g/dL (ref 30.0–36.0)
MCV: 91.5 fL (ref 78.0–100.0)
Platelets: 252 10*3/uL (ref 150–400)
RBC: 3.98 MIL/uL (ref 3.87–5.11)
RDW: 14.1 % (ref 11.5–15.5)
WBC: 9.9 10*3/uL (ref 4.0–10.5)

## 2016-06-15 MED ORDER — FUROSEMIDE 40 MG PO TABS: 40.0000 mg | ORAL_TABLET | Freq: Every day | ORAL | Status: DC

## 2016-06-15 NOTE — Discharge Summary (Signed)
Discharge Summary    Patient ID: Virginia Huber,  MRN: [id number], DOB/AGE: 71-24-1947 71 y.o.  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 Penicillins Hives    Has patient had a PCN reaction causing immediate rash, facial/tongue/throat swelling, SOB or lightheadedness with hypotension: Yes Has patient had a PCN reaction causing severe rash involving mucus membranes or skin necrosis: No Has patient had a PCN reaction that required hospitalization No Has patient had a PCN reaction occurring within the last 10 years: No If all of the above answers are "NO", then may proceed with Cephalosporin use.  [name] Percocet [Oxycodone-Acetaminophen] Nausea And Vomiting    Diagnostic Studies/Procedures   None _____________   History of Present Illness     70-year-old female with medical history of atrial fibrillation and flutter, paroxysmally, coronary artery disease, GERD, heart block, hypertension, hypercholesterolemia, peripheral arterial disease followed by Dr. Fletcher Anon, and type 2 diabetes. The patient was admitted with atrial fibrillation with RVR, given IV diltiazem. Admitted for evaluation and response to treatment.  Hospital Course     Consultants:  Arrival the patient was in atrial flutter with RVR, likely post cardi aversion sinus arrest resulting in junctional rhythm and near syncope. The patient is return to normal sinus rhythm and is feeling much better after given IV diltiazem. She changed from atrial flutter from RVR to post-cardioversion  positive of 5.6 seconds to junctional rhythm to normal sinus rhythm. She's been placed on ELIQUIS. She is very symptomatic with these episodes. The patient continued to have chest pain, only during rapid heart rhythm however. Her troponin was negative 3.   The patient was also found to have mild diastolic CHF with baseline weight of 150 pounds. Her weight on admission was 156 pounds. She was given one dose of IV Lasix. She diuresed 5.1 L her in hospitalization. The patient became asymptomatic and was anxious to return home as today is her birthday.  Per Dr. Meda Coffee, she has evidence of tachybradycardia syndrome and may need to be assessed for permanent pacemaker implantation. She is to follow up with electrophysiology for their evaluation and need to proceed with implantation if clinically warranted. The patient is been seen and examined by Dr. Meda Coffee and found to be stable for discharge. She will continue current medication regimen with the exception of amlodipine which was discontinued and she will continue diltiazem instead. She will remain on anticoagulation therapy. _____________  Discharge Vitals Blood pressure (!) 134/57, pulse 63, temperature 98.3 F (36.8 C), temperature source Oral, resp. rate 13, height 5\' 3"  (1.6 m), weight 156 lb 8.4 oz (71 kg), SpO2 97 %.  Filed Weights   06/13/16 2336 06/14/16 0545  Weight: 149 lb (67.6 kg) 156 lb 8.4 oz (71 kg)    Labs & Radiologic Studies     CBC  Recent Labs  06/13/16 2339 06/15/16 0345  WBC 10.6* 9.9  HGB 12.6 11.8*  HCT 37.4 36.4  MCV 92.1 91.5  PLT 282 196   Basic Metabolic Panel  Recent Labs  06/13/16 2339 06/15/16 0345  NA 139 141  K 4.0 3.7  CL 106 107  CO2 23 26  GLUCOSE 269* 236*  BUN 24* 17  CREATININE 1.02* 0.90  CALCIUM 9.5 9.0   Liver Function Tests  Recent Labs  06/14/16 0922  AST 14*  ALT 15  ALKPHOS 77  BILITOT 0.5  PROT 5.3*  ALBUMIN 3.5   No results for input(s): LIPASE, AMYLASE in the last  72 hours. Cardiac Enzymes  Recent Labs  06/13/16 2339 06/14/16 0300  TROPONINI <0.03 <0.03    Dg Chest 2 View  Result Date: 06/07/2016 CLINICAL DATA:  Initial evaluation for acute chest pain, shortness of breath. EXAM: CHEST  2 VIEW COMPARISON:  Prior radiograph from 10/28/2015. FINDINGS: The cardiac and mediastinal silhouettes are stable in size and contour, and remain within normal limits. Mild elevation the right hemidiaphragm, stable. No airspace consolidation, pleural effusion, or pulmonary edema is identified. There is no pneumothorax. No acute osseous abnormality identified. IMPRESSION: No active cardiopulmonary disease. Electronically Signed   By: Jeannine Boga M.D.   On: 06/07/2016 02:05   Dg Chest Port 1 View  Result Date: 06/14/2016 CLINICAL DATA:  Chest pain and shortness of breath EXAM: PORTABLE CHEST 1 VIEW COMPARISON:  Chest radiograph 06/07/2016 FINDINGS: Cardiomediastinal silhouette is mildly enlarged. There are bilateral peribronchial opacities, greatest near the right hilum. No pleural effusion or pneumothorax. IMPRESSION: Mild cardiomegaly and mild pulmonary edema. Electronically Signed   By: Ulyses Jarred M.D.   On: 06/14/2016 00:08    Disposition   Pt is being discharged home today in good condition.  Follow-up Plans & Appointments    Follow-up [place], MD Follow up.   Specialty:  Cardiology Why:  Our office will call you for appointment You will also be contacted by electrophysiology for appointment Contact information: [place] [address] [phone]        Will Meredith Leeds, MD Follow up.   Specialty:  Cardiology Why:  Our office will call your for follow up appoitnment with electrophysiology.  Contact information: [address] [place] [address] [phone]          Discharge Instructions    Amb referral to AFIB Clinic    Complete by:  As directed    Call MD for:     Complete by:  As directed    Call MD for:  difficulty breathing, headache or visual disturbances    Complete by:  As directed    Call MD for:  extreme fatigue    Complete by:  As directed    Call MD for:  persistant dizziness or light-headedness    Complete by:  As directed    Call MD for:  persistant nausea and vomiting    Complete by:  As directed    Call MD for:  severe uncontrolled pain    Complete by:  As directed    Call MD for:  temperature >100.4    Complete by:  As directed    Diet - low sodium heart healthy    Complete by:  As directed    Increase activity slowly    Complete by:  As directed       Discharge Medications   Current Discharge Medication List    CONTINUE these medications which have NOT CHANGED   Details  acetaminophen (TYLENOL) 325 MG tablet Take 650 mg by mouth every 6 (six) hours as needed for headache.    apixaban (ELIQUIS) 5 MG TABS tablet  Take 1 tablet (5 mg total) by mouth 2 (two) times daily. Qty: 56 tablet, Refills: 0    atorvastatin (LIPITOR) 80 MG tablet Take 1 tablet (80 mg total) by mouth daily at 6 PM. Qty: 30 tablet, Refills: 6    cholecalciferol (VITAMIN D) 1000 UNITS tablet Take 1,000 Units by mouth at bedtime.     clopidogrel (PLAVIX) 75 MG tablet Take 1 tablet (75 mg total) by mouth daily. Qty: 90 tablet, Refills: 3    diltiazem (CARDIZEM CD) 120 MG 24 hr capsule Take 1 capsule (120 mg total) by mouth daily. Qty: 90 capsule, Refills: 3    furosemide (LASIX) 20 MG tablet Take 2 tablets (40 mg total) by mouth 2 (two) times daily. Qty: 60 tablet, Refills: 6    insulin NPH-regular Human (NOVOLIN 70/30) (70-30) 100 UNIT/ML injection Inject 30-40 Units into the skin See admin instructions. Inject 30 units before breakfast and 40 units before supper  - only if pre-meal blood glucose is above 90 mg/dL.    labetalol (NORMODYNE) 200 MG tablet Take 2 tablets (400 mg total) by mouth 2 (two) times daily. Qty: 120 tablet, Refills: 6      levothyroxine (SYNTHROID, LEVOTHROID) 150 MCG tablet Take 150 mcg by mouth daily before breakfast.    losartan (COZAAR) 100 MG tablet TAKE ONE (1) TABLET BY MOUTH EVERY DAY Qty: 90 tablet, Refills: 3    magnesium oxide (MAG-OX) 400 MG tablet Take 400 mg by mouth daily.    metFORMIN (GLUCOPHAGE) 500 MG tablet Take 500 mg by mouth 2 (two) times daily with a meal.    nitroGLYCERIN (NITROSTAT) 0.4 MG SL tablet Place 1 tablet (0.4 mg total) under the tongue every 5 (five) minutes as needed for chest pain. Qty: 25 tablet, Refills: 12    Omega-3 Fatty Acids (FISH OIL) 1200 MG CAPS Take 2 capsules (2,400 mg total) by mouth 2 (two) times daily. Qty: 120 each, Refills: 3    potassium chloride SA (KLOR-CON M20) 20 MEQ tablet Take 1 tablet (20 mEq total) by mouth daily. Qty: 90 tablet, Refills: 3    rOPINIRole (REQUIP) 0.5 MG tablet Take 0.5 mg by mouth at bedtime.  Refills: 0    sodium chloride (OCEAN) 0.65 % SOLN nasal spray Place 1 spray into both nostrils as needed for congestion. Qty: 1 Bottle, Refills: 0   Associated Diagnoses: Paroxysmal atrial fibrillation with RVR ([place]); Systolic congestive heart failure, unspecified congestive heart failure chronicity ([place]); Edema of both ankles; Multiple and bilateral precerebral artery syndromes; CAD in native artery; OSA (obstructive sleep apnea)      STOP taking these medications     amLODipine (NORVASC) 10 MG tablet            Outstanding Labs/Studies  None  Duration of Discharge Encounter   Greater than 30 minutes including physician time.  Signed, Jory Sims NP 06/15/2016, 10:23 AM

## 2016-06-15 NOTE — Progress Notes (Signed)
Pt discharge teaching completed at bedside with husband present.  Both parities verbalized understanding no questions at this time.  Assessment unchanged at discharge.  Warden Fillers RN CCRN

## 2016-06-15 NOTE — Progress Notes (Signed)
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   CURRENT MEDS . amLODipine  10 mg Oral Daily  . apixaban  5 mg Oral BID  . atorvastatin  80 mg Oral q1800  . clopidogrel  75 mg Oral Daily  . furosemide  40 mg Intravenous BID  . insulin aspart  0-15 Units Subcutaneous TID WC  . insulin aspart  0-5 Units Subcutaneous QHS  . insulin aspart  4 Units Subcutaneous TID WC  . insulin glargine  25 Units Subcutaneous QHS  . labetalol  400 mg Oral BID  . levothyroxine  150 mcg Oral QAC breakfast  . losartan  100 mg Oral Daily  . magnesium oxide  400 mg Oral Daily  . potassium chloride SA  20 mEq Oral Daily  . rOPINIRole  0.5 mg Oral QHS    OBJECTIVE  Vitals:   06/15/16 0500 06/15/16 0600 06/15/16 0700 06/15/16 0750  BP: (!) 133/56 (!) 134/56 (!) 134/57   Pulse: 65 71 63   Resp: 17 19 13    Temp:    98.3 F (36.8 C)  TempSrc:    Oral  SpO2: 93% 99% 97%   Weight:      Height:        Intake/Output Summary (Last 24 hours) at 06/15/16 0900 Last data filed at 06/15/16 0400  Gross per 24 hour  Intake             1320 ml  Output             5950 ml  Net            -4630 ml   Filed Weights   06/13/16 2336 06/14/16 0545  Weight: 149 lb (67.6 kg) 156 lb 8.4 oz (71 kg)    PHYSICAL EXAM  General: Pleasant, NAD. Neuro: Alert and oriented X 3. Moves all extremities spontaneously. Psych: Normal affect. HEENT:  Normal  Neck: Supple without bruits or JVD. Lungs:  Resp regular and unlabored, crackles at the basis. Heart: RRR no s3, s4, or murmurs. Abdomen: Soft, non-tender, non-distended, BS + x 4.    Extremities: No clubbing, cyanosis or edema. DP/PT/Radials 2+ and equal bilaterally.  Accessory Clinical Findings  CBC  Recent Labs  06/13/16 2339 06/15/16 0345  WBC 10.6* 9.9  HGB 12.6 11.8*  HCT 37.4 36.4  MCV 92.1 91.5  PLT 282 619   Basic Metabolic Panel  Recent Labs  06/13/16 2339 06/15/16 0345  NA 139 141  K 4.0 3.7  CL 106 107  CO2 23 26  GLUCOSE 269* 236*  BUN 24* 17  CREATININE 1.02* 0.90  CALCIUM 9.5 9.0    Recent Labs  06/13/16 2339 06/14/16 0300  TROPONINI <0.03 <0.03   Radiology/Studies  Dg Chest 2 View  Result Date: 06/07/2016 CLINICAL DATA:  Initial evaluation for acute chest pain, shortness of breath. EXAM: CHEST  2 VIEW COMPARISON:  Prior radiograph from 10/28/2015. FINDINGS: The cardiac and mediastinal silhouettes are stable in size and contour, and remain within normal limits. Mild elevation the right hemidiaphragm, stable. No airspace consolidation, pleural effusion, or pulmonary edema is  identified. There is no pneumothorax. No acute osseous abnormality identified. IMPRESSION: No active cardiopulmonary disease. Electronically Signed   By: Jeannine Boga M.D.   On: 06/07/2016 02:05   Dg Chest Port 1 View  Result Date: 06/14/2016 CLINICAL DATA:  Chest pain and shortness of breath EXAM: PORTABLE CHEST 1 VIEW COMPARISON:  Chest radiograph 06/07/2016 FINDINGS: Cardiomediastinal silhouette is mildly enlarged. There are bilateral peribronchial opacities, greatest near the right hilum. No pleural effusion or pneumothorax. IMPRESSION: Mild cardiomegaly and mild pulmonary edema. Electronically Signed   By: Ulyses Jarred M.D.   On: 06/14/2016 00:08   TELE: a-fib --> SR  TTE: 05/24/2015 - Left ventricle: The cavity size was normal. Wall thickness was   increased in a pattern of mild LVH. Systolic function was normal.   The estimated ejection fraction was in the range of 55% to 60%.   Wall motion was normal; there were no regional wall motion    abnormalities. Features are consistent with a pseudonormal left   ventricular filling pattern, with concomitant abnormal relaxation   and increased filling pressure (grade 2 diastolic dysfunction). - Aortic valve: Mildly calcified annulus. Trileaflet; mildly   thickened leaflets. Valve area (VTI): 2.07 cm^2. Valve area   (Vmax): 1.98 cm^2. - Mitral valve: Mildly calcified annulus. Mildly thickened leaflets   . - Left atrium: The atrium was severely dilated. - Atrial septum: No defect or patent foramen ovale was identified. - Technically adequate study.   ASSESSMENT AND PLAN  Atrial fibrillation - paroxysmal   Assessment:  Patient with history of atrial fib now with likely post conversion sinus arrest resulting in junctional rhythm and near syncope.  Currently in sinus and feeling much better.   She was given iv Cardizem and went from a-fib with RVR to atrial flutter to postconversion pause of 5.6 seconds, to junctional rhythm, currently in SR, feeling whole lot better but not to baseline. She has been on Eliquis, we will continue. - she has had several very symptomatic episodes, we will refer to EP clinic at discharge  2. Chest pain - only while in a-fib with RVR, now resolved, troponin negative x 3, the last cath in 07/2015, no ischemia workup indicated at this point  3. Acute diastolic CHF - sec to a-fib with RVR - she is mildly fluid overloaded on physical exam, baseline weight 150 lbs, - 4.6 L since yesterday, now euvolemic, switch to lasix 40 mg po daily.  Discharge home today, follow up within 7-10 days, please arrange an appointment at the EP clinic.   Signed, Ena Dawley MD, [facility] 06/15/2016

## 2016-06-16 DIAGNOSIS — R001 Bradycardia, unspecified: Secondary | ICD-10-CM | POA: Diagnosis not present

## 2016-06-16 DIAGNOSIS — I482 Chronic atrial fibrillation: Secondary | ICD-10-CM | POA: Diagnosis not present

## 2016-06-16 DIAGNOSIS — Z6827 Body mass index (BMI) 27.0-27.9, adult: Secondary | ICD-10-CM | POA: Diagnosis not present

## 2016-06-16 DIAGNOSIS — Z1389 Encounter for screening for other disorder: Secondary | ICD-10-CM | POA: Diagnosis not present

## 2016-06-16 DIAGNOSIS — E663 Overweight: Secondary | ICD-10-CM | POA: Diagnosis not present

## 2016-06-17 MED FILL — Medication: Qty: 1 | Status: AC

## 2016-06-18 ENCOUNTER — Telehealth: Payer: Self-pay | Admitting: Neurology

## 2016-06-18 DIAGNOSIS — G4733 Obstructive sleep apnea (adult) (pediatric): Secondary | ICD-10-CM

## 2016-06-18 NOTE — Telephone Encounter (Signed)
PATIENT'S NAME:  Virginia Huber, Virginia Huber DOB:      14-Apr-1946      MR#:    [id number]     DATE OF RECORDING: 06/08/2016 REFERRING M.D.:  Kate Sable, MD Study Performed:   CPAP  Titration HISTORY: Virginia Huber is a 70 y.o. female , seen here as a referral from Dr. Kate Sable,   Virginia Huber was referred by her cardiologist for a sleep study to [facility], where she completed a sleep study but then was never given the results, a phone call or a follow-up appointment. She stated that she had similar experiences when she had suffered a TIA.  Based on the report we obtained the patient has a body mass index of 72, is 70 years of age, and went to the sleep lab 04/12/2016. Her AHI was only 10 per hour which constitutes mild sleep apnea. In supine sleep her AHI was 14.3, the lowest oxygen saturation at night was 86% and the technologist noted loud snoring. She slept about half of the night in supine sleep position she reached REM sleep with an AHI of 29.3 per hour. Between June 2016 and May 2017 this patient had been admitted 5 times to hospital with atrial fibrillation and related to diastolic output failure. As she is on high embolic risk by A. fib and the related congestive heart failure, she remains on Eloquis and Plavix. Given the constellation of mild apnea with underlying  CAD , MI 4 years ago, double pneumonia in 2015, atrial fibrillation and previous stroke-TIA, congestive heart failure and REM dependent apnea, she needs to have an urgent CPAP titration.    Arthritis, Atrial Fibrillation, CAD, MI, GERD, Heart block, Hypertension, PAD, TIA, Type II diabetes mellitus., ovarian tumor,   The patient endorsed the Epworth Sleepiness Scale at 6/24 points and the Fatigue Score at 37 points.   The patient's weight 151 pounds with a height of 63 (inches), resulting in a BMI of 26.6 kg/m2. The patient's neck circumference measured 15.2 inches.  CURRENT MEDICATIONS: Norvasc, Eliquis,  Lipitor, Vitamin D, Plavix, Lasix, Novolin, Normodyne, Synthroid, Cozaar, Mag-Ox, Glucophage, Nitrostat, Fish Oil, Klor-Con, Requip.    PROCEDURE:  This is a multichannel digital polysomnogram utilizing the SomnoStar 11.2 system.  Electrodes and sensors were applied and monitored per AASM Specifications.   EEG, EOG, Chin and Limb EMG, were sampled at 200 Hz.  ECG, Snore and Nasal Pressure, Thermal Airflow, Respiratory Effort, CPAP Flow and Pressure, Oximetry was sampled at 50 Hz. Digital video and audio were recorded.       CPAP was initiated at 0 cmH20 with heated humidity per AASM split night standards and pressure was advanced to 0/0cmH20 because of hypopneas, apneas and desaturations.  At a PAP pressure of 0 cmH20, there was a reduction of the AHI to 0 with improvement of the above symptoms of obstructive sleep apnea.   Lights Out was at 22:43 and Lights On at 04:52. Total recording time (TRT) was 369 minutes, with a total sleep time (TST) of 246.5 minutes. The patient's sleep latency was 55 minutes with 28 minutes of wake time after sleep onset. REM latency was 103 minutes.  The sleep efficiency was 66.8 %.    SLEEP ARCHITECTURE: WASO (Wake after sleep onset)  was 39.5 minutes.  There were 17.5 minutes in Stage N1, 169 minutes Stage N2, 41 minutes Stage N3 and 19 minutes in Stage REM.  The percentage of Stage N1 was 7.1%, Stage N2 was 68.6%, Stage N3  was 16.6% and Stage R (REM sleep) was 7.7%.   The arousals were noted as: 29 were spontaneous, 0 were associated with PLMs, 0 were associated with respiratory events. EKG was in keeping with normal sinus rhythm (NSR).   RESPIRATORY ANALYSIS:  There were 9 respiratory events: 0 obstructive apneas, 0 central apneas and 0 mixed apneas with a total of 0 apneas and an apnea index (AI) of 0 /hour. There were 9 hypopneas with a hypopnea index of 2.2/hour. The patient also had 0 respiratory event related arousals (RERAs).     The total APNEA/HYPOPNEA INDEX   (AHI) was 2.2 /hour and the total RESPIRATORY DISTURBANCE INDEX was 2.2 .hour  4 events occurred in REM sleep and 5 events in NREM. The REM AHI was 12.6 /hour versus a non-REM AHI of 1.3 /hour.  The patient spent 164 minutes of total sleep time in the supine position and 83 minutes in non-supine. The supine AHI was 3.3, versus a non-supine AHI of 0.0.  OXYGEN SATURATION & C02:  The baseline 02 saturation was 96%, with the lowest being 88%. Time spent below 89% saturation equaled 0 minutes.  PERIODIC LIMB MOVEMENTS:    The patient had a total of 0 Periodic Limb Movements.   DIAGNOSIS . Obstructive Sleep Apnea with good response to CPAP on FFM, 10 cm water pressure. The patient was fitted with a -FFM, [employer] SIMPLUS in small size apparatus. 1.    PLANS/RECOMMENDATIONS: CPAP is ordered for this patient, and we will meet in 30-60 days for a compliance follow up.   A follow up appointment will be scheduled in the Sleep Clinic at [facility].   Please call [phone] with any questions.      I certify that I have reviewed the entire raw data recording prior to the issuance of this report in accordance with the Standards of Accreditation of the American Academy of Sleep Medicine (AASM)   Larey Seat, M.D. Diplomat, Tax adviser of Psychiatry and Neurology  Diplomat, Tax adviser of Sleep Medicine Market researcher of [employer] at [employer], an [employer] accredited facility

## 2016-06-18 NOTE — Procedures (Signed)
PATIENT'S NAME:  Virginia Huber, Virginia Huber DOB:      Aug 29, 1945      MR#:    [id number]     DATE OF RECORDING: 06/08/2016 REFERRING M.D.:  Kate Sable, MD Study Performed:   CPAP  Titration HISTORY: Virginia Huber is a 70 y.o. female , seen here as a referral from Dr. Kate Sable,   Virginia Huber was referred by her cardiologist for a sleep study to [facility], where she completed a sleep study but then was never given the results, a phone call or a follow-up appointment. She stated that she had similar experiences when she had suffered a TIA.  Based on the report we obtained the patient has a body mass index of 67, is 70 years of age, and went to the sleep lab 04/12/2016. Her AHI was only 10 per hour which constitutes mild sleep apnea. In supine sleep her AHI was 14.3, the lowest oxygen saturation at night was 86% and the technologist noted loud snoring. She slept about half of the night in supine sleep position she reached REM sleep with an AHI of 29.3 per hour. Between June 2016 and May 2017 this patient had been admitted 5 times to hospital with atrial fibrillation and related to diastolic output failure. As she is on high embolic risk by A. fib and the related congestive heart failure, she remains on Eloquis and Plavix. Given the constellation of mild apnea with underlying  CAD , MI 4 years ago, double pneumonia in 2015, atrial fibrillation and previous stroke-TIA, congestive heart failure and REM dependent apnea, she needs to have an urgent CPAP titration.    Arthritis, Atrial Fibrillation, CAD, MI, GERD, Heart block, Hypertension, PAD, TIA, Type II diabetes mellitus., ovarian tumor,   The patient endorsed the Epworth Sleepiness Scale at 6/24 points and the Fatigue Score at 37 points.   The patient's weight 151 pounds with a height of 63 (inches), resulting in a BMI of 26.6 kg/m2. The patient's neck circumference measured 15.2 inches.  CURRENT MEDICATIONS: Norvasc, Eliquis,  Lipitor, Vitamin D, Plavix, Lasix, Novolin, Normodyne, Synthroid, Cozaar, Mag-Ox, Glucophage, Nitrostat, Fish Oil, Klor-Con, Requip.    PROCEDURE:  This is a multichannel digital polysomnogram utilizing the SomnoStar 11.2 system.  Electrodes and sensors were applied and monitored per AASM Specifications.   EEG, EOG, Chin and Limb EMG, were sampled at 200 Hz.  ECG, Snore and Nasal Pressure, Thermal Airflow, Respiratory Effort, CPAP Flow and Pressure, Oximetry was sampled at 50 Hz. Digital video and audio were recorded.       CPAP was initiated at 0 cmH20 with heated humidity per AASM split night standards and pressure was advanced to 0/0cmH20 because of hypopneas, apneas and desaturations.  At a PAP pressure of 0 cmH20, there was a reduction of the AHI to 0 with improvement of the above symptoms of obstructive sleep apnea.   Lights Out was at 22:43 and Lights On at 04:52. Total recording time (TRT) was 369 minutes, with a total sleep time (TST) of 246.5 minutes. The patient's sleep latency was 55 minutes with 28 minutes of wake time after sleep onset. REM latency was 103 minutes.  The sleep efficiency was 66.8 %.    SLEEP ARCHITECTURE: WASO (Wake after sleep onset)  was 39.5 minutes.  There were 17.5 minutes in Stage N1, 169 minutes Stage N2, 41 minutes Stage N3 and 19 minutes in Stage REM.  The percentage of Stage N1 was 7.1%, Stage N2 was 68.6%, Stage N3  was 16.6% and Stage R (REM sleep) was 7.7%.   The arousals were noted as: 29 were spontaneous, 0 were associated with PLMs, 0 were associated with respiratory events. EKG was in keeping with normal sinus rhythm (NSR).   RESPIRATORY ANALYSIS:  There were 9 respiratory events: 0 obstructive apneas, 0 central apneas and 0 mixed apneas with a total of 0 apneas and an apnea index (AI) of 0 /hour. There were 9 hypopneas with a hypopnea index of 2.2/hour. The patient also had 0 respiratory event related arousals (RERAs).     The total APNEA/HYPOPNEA INDEX   (AHI) was 2.2 /hour and the total RESPIRATORY DISTURBANCE INDEX was 2.2 .hour  4 events occurred in REM sleep and 5 events in NREM. The REM AHI was 12.6 /hour versus a non-REM AHI of 1.3 /hour.  The patient spent 164 minutes of total sleep time in the supine position and 83 minutes in non-supine. The supine AHI was 3.3, versus a non-supine AHI of 0.0.  OXYGEN SATURATION & C02:  The baseline 02 saturation was 96%, with the lowest being 88%. Time spent below 89% saturation equaled 0 minutes.  PERIODIC LIMB MOVEMENTS:    The patient had a total of 0 Periodic Limb Movements.   DIAGNOSIS . Obstructive Sleep Apnea with good response to CPAP on FFM, 10 cm water pressure. The patient was fitted with a -FFM, [employer] SIMPLUS in small size apparatus. 1.    PLANS/RECOMMENDATIONS: CPAP is ordered for this patient, and we will meet in 30-60 days for a compliance follow up.   A follow up appointment will be scheduled in the Sleep Clinic at [facility].   Please call [phone] with any questions.      I certify that I have reviewed the entire raw data recording prior to the issuance of this report in accordance with the Standards of Accreditation of the American Academy of Sleep Medicine (AASM)   Larey Seat, M.D. Diplomat, Tax adviser of Psychiatry and Neurology  Diplomat, Tax adviser of Sleep Medicine Market researcher of [employer] at [employer], an [employer] accredited facility

## 2016-06-18 NOTE — Telephone Encounter (Signed)
I spoke to patient and she is aware of results and recommendations. SHe is willing to start treatment and would like to go to [place]. Patient will get a letter reminding her to make f/u appt and stress the importance of compliance. PCP will get copy of study.

## 2016-06-19 DIAGNOSIS — G4733 Obstructive sleep apnea (adult) (pediatric): Secondary | ICD-10-CM | POA: Diagnosis not present

## 2016-06-23 ENCOUNTER — Ambulatory Visit (INDEPENDENT_AMBULATORY_CARE_PROVIDER_SITE_OTHER): Payer: PPO | Admitting: Cardiology

## 2016-06-23 ENCOUNTER — Other Ambulatory Visit: Payer: Self-pay | Admitting: Cardiology

## 2016-06-23 ENCOUNTER — Encounter: Payer: Self-pay | Admitting: *Deleted

## 2016-06-23 ENCOUNTER — Encounter: Payer: Self-pay | Admitting: Gastroenterology

## 2016-06-23 ENCOUNTER — Encounter: Payer: Self-pay | Admitting: Cardiology

## 2016-06-23 VITALS — BP 152/56 | HR 64 | Ht 63.0 in | Wt 149.8 lb

## 2016-06-23 DIAGNOSIS — Z01812 Encounter for preprocedural laboratory examination: Secondary | ICD-10-CM

## 2016-06-23 DIAGNOSIS — I495 Sick sinus syndrome: Secondary | ICD-10-CM

## 2016-06-23 NOTE — Patient Instructions (Signed)
Medication Instructions:    Your physician recommends that you continue on your current medications as directed. Please refer to the Current Medication list given to you today.  --- If you need a refill on your cardiac medications before your next appointment, please call your pharmacy. ---  Labwork:  Pre procedure labs today: BMET & CBC w/ diff  Testing/Procedures: Your physician has recommended that you have a pacemaker inserted. A pacemaker is a small device that is placed under the skin of your chest or abdomen to help control abnormal heart rhythms. This device uses electrical pulses to prompt the heart to beat at a normal rate. Pacemakers are used to treat heart rhythms that are too slow. Wire (leads) are attached to the pacemaker that goes into the chambers of you heart. This is done in the hospital and usually requires and overnight stay. Please see the instruction sheet given to you today for more information.  Follow-Up:  Your physician recommends that you schedule a wound check appointment, after your procedure on 06/25/16, with the device clinic.   Your physician recommends that you schedule a follow up appointment in 3 months, after your procedure on 06/25/16, with Dr. Curt Bears.  Thank you for choosing [facility]!!   Trinidad Curet, RN [phone]  Any Other Special Instructions Will Be Listed Below (If Applicable).    Pacemaker Implantation, Adult Pacemaker implantation is a procedure to place a pacemaker inside your chest. A pacemaker is a small computer that sends electrical signals to the heart and helps your heart beat normally. A pacemaker also stores information about your heart rhythms. You may need pacemaker implantation if you:  Have a slow heartbeat (bradycardia).  Faint (syncope).  Have shortness of breath (dyspnea) due to heart problems. The pacemaker attaches to your heart through a wire, called a lead. Sometimes just one lead is needed. Other times,  there will be two leads. There are two types of pacemakers:  Transvenous pacemaker. This type is placed under the skin or muscle of your chest. The lead goes through a vein in the chest area to reach the inside of the heart.  Epicardial pacemaker. This type is placed under the skin or muscle of your chest or belly. The lead goes through your chest to the outside of the heart. Tell a health care provider about:  Any allergies you have.  All medicines you are taking, including vitamins, herbs, eye drops, creams, and over-the-counter medicines.  Any problems you or family members have had with anesthetic medicines.  Any blood or bone disorders you have.  Any surgeries you have had.  Any medical conditions you have.  Whether you are pregnant or may be pregnant. What are the risks? Generally, this is a safe procedure. However, problems may occur, including:  Infection.  Bleeding.  Failure of the pacemaker or the lead.  Collapse of a lung or bleeding into a lung.  Blood clot inside a blood vessel with a lead.  Damage to the heart.  Infection inside the heart (endocarditis).  Allergic reactions to medicines. What happens before the procedure? Staying hydrated  Follow instructions from your health care provider about hydration, which may include:  Up to 2 hours before the procedure - you may continue to drink clear liquids, such as water, clear fruit juice, black coffee, and plain tea. Eating and drinking restrictions  Follow instructions from your health care provider about eating and drinking, which may include:  8 hours before the procedure - stop eating  heavy meals or foods such as meat, fried foods, or fatty foods.  6 hours before the procedure - stop eating light meals or foods, such as toast or cereal.  6 hours before the procedure - stop drinking milk or drinks that contain milk.  2 hours before the procedure - stop drinking clear liquids. Medicines  Ask your  health care provider about:  Changing or stopping your regular medicines. This is especially important if you are taking diabetes medicines or blood thinners.  Taking medicines such as aspirin and ibuprofen. These medicines can thin your blood. Do not take these medicines before your procedure if your health care provider instructs you not to.  You may be given antibiotic medicine to help prevent infection. General instructions  You will have a heart evaluation. This may include an electrocardiogram (ECG), chest X-ray, and heart imaging (echocardiogram,  or echo) tests.  You will have blood tests.  Do not use any products that contain nicotine or tobacco, such as cigarettes and e-cigarettes. If you need help quitting, ask your health care provider.  Plan to have someone take you home from the hospital or clinic.  If you will be going home right after the procedure, plan to have someone with you for 24 hours.  Ask your health care provider how your surgical site will be marked or identified. What happens during the procedure?  To reduce your risk of infection:  Your health care team will wash or sanitize their hands.  Your skin will be washed with soap.  Hair may be removed from the surgical area.  An IV tube will be inserted into one of your veins.  You will be given one or more of the following:  A medicine to help you relax (sedative).  A medicine to numb the area (local anesthetic).  A medicine to make you fall asleep (general anesthetic).  If you are getting a transvenous pacemaker:  An incision will be made in your upper chest.  A pocket will be made for the pacemaker. It may be placed under the skin or between layers of muscle.  The lead will be inserted into a blood vessel that returns to the heart.  While X-rays are taken by an imaging machine (fluoroscopy), the lead will be advanced through the vein to the inside of your heart.  The other end of the lead  will be tunneled under the skin and attached to the pacemaker.  If you are getting an epicardial pacemaker:  An incision will be made near your ribs or breastbone (sternum) for the lead.  The lead will be attached to the outside of your heart.  Another incision will be made in your chest or upper belly to create a pocket for the pacemaker.  The free end of the lead will be tunneled under the skin and attached to the pacemaker.  The transvenous or epicardial pacemaker will be tested. Imaging studies may be done to check the lead position.  The incisions will be closed with stitches (sutures), adhesive strips, or skin glue.  Bandages (dressing) will be placed over the incisions. The procedure may vary among health care providers and hospitals. What happens after the procedure?  Your blood pressure, heart rate, breathing rate, and blood oxygen level will be monitored until the medicines you were given have worn off.  You will be given antibiotics and pain medicine.  ECG and chest x-rays will be done.  You will wear a continuous type of ECG (Holter monitor)  to check your heart rhythm.  Your health care provider willprogram the pacemaker.  Do not drive for 24 hours if you received a sedative. This information is not intended to replace advice given to you by your health care provider. Make sure you discuss any questions you have with your health care provider. Document Released: 05/29/2002 Document Revised: 12/27/2015 Document Reviewed: 11/20/2015 Elsevier Interactive Patient Education  2017 [employer]  Amiodarone tablets What is this medicine? AMIODARONE (a MEE oh da rone) is an antiarrhythmic drug. It helps make your heart beat regularly. Because of the side effects caused by this medicine, it is only used when other medicines have not worked. It is usually used for heartbeat problems that may be life threatening. This medicine may be used for other purposes; ask your health  care provider or pharmacist if you have questions. COMMON BRAND NAME(S): Cordarone, Pacerone What should I tell my health care provider before I take this medicine? They need to know if you have any of these conditions: -liver disease -lung disease -other heart problems -thyroid disease -an unusual or allergic reaction to amiodarone, iodine, other medicines, foods, dyes, or preservatives -pregnant or trying to get pregnant -breast-feeding How should I use this medicine? Take this medicine by mouth with a glass of water. Follow the directions on the prescription label. You can take this medicine with or without food. However, you should always take it the same way each time. Take your doses at regular intervals. Do not take your medicine more often than directed. Do not stop taking except on the advice of your doctor or health care professional. A special MedGuide will be given to you by the pharmacist with each prescription and refill. Be sure to read this information carefully each time. Talk to your pediatrician regarding the use of this medicine in children. Special care may be needed. Overdosage: If you think you have taken too much of this medicine contact a poison control center or emergency room at once. NOTE: This medicine is only for you. Do not share this medicine with others. What if I miss a dose? If you miss a dose, take it as soon as you can. If it is almost time for your next dose, take only that dose. Do not take double or extra doses. What may interact with this medicine? Do not take this medicine with any of the following medications: -abarelix -apomorphine -arsenic trioxide -certain antibiotics like erythromycin, gemifloxacin, levofloxacin, pentamidine -certain medicines for depression like amoxapine, tricyclic antidepressants -certain medicines for fungal infections like fluconazole, itraconazole, ketoconazole, posaconazole, voriconazole -certain medicines for irregular  heart beat like disopyramide, dofetilide, dronedarone, ibutilide, propafenone, sotalol -certain medicines for malaria like chloroquine, halofantrine -cisapride -droperidol -haloperidol -hawthorn -maprotiline -methadone -phenothiazines like chlorpromazine, mesoridazine, thioridazine -pimozide -ranolazine -red yeast rice -vardenafil -ziprasidone This medicine may also interact with the following medications: -antiviral medicines for HIV or AIDS -certain medicines for blood pressure, heart disease, irregular heart beat -certain medicines for cholesterol like atorvastatin, cerivastatin, lovastatin, simvastatin -certain medicines for hepatitis C like sofosbuvir and ledipasvir; sofosbuvir -certain medicines for seizures like phenytoin -certain medicines for thyroid problems -certain medicines that treat or prevent blood clots like warfarin -cholestyramine -cimetidine -clopidogrel -cyclosporine -dextromethorphan -diuretics -fentanyl -general anesthetics -grapefruit juice -lidocaine -loratadine -methotrexate -other medicines that prolong the QT interval (cause an abnormal heart rhythm) -procainamide -quinidine -rifabutin, rifampin, or rifapentine -St. John's Wort -trazodone This list may not describe all possible interactions. Give your health care provider a list of all the medicines,  herbs, non-prescription drugs, or dietary supplements you use. Also tell them if you smoke, drink alcohol, or use illegal drugs. Some items may interact with your medicine. What should I watch for while using this medicine? Your condition will be monitored closely when you first begin therapy. Often, this drug is first started in a hospital or other monitored health care setting. Once you are on maintenance therapy, visit your doctor or health care professional for regular checks on your progress. Because your condition and use of this medicine carry some risk, it is a good idea to carry an  identification card, necklace or bracelet with details of your condition, medications, and doctor or health care professional. Dennis Bast may get drowsy or dizzy. Do not drive, use machinery, or do anything that needs mental alertness until you know how this medicine affects you. Do not stand or sit up quickly, especially if you are an older patient. This reduces the risk of dizzy or fainting spells. This medicine can make you more sensitive to the sun. Keep out of the sun. If you cannot avoid being in the sun, wear protective clothing and use sunscreen. Do not use sun lamps or tanning beds/booths. You should have regular eye exams before and during treatment. Call your doctor if you have blurred vision, see halos, or your eyes become sensitive to light. Your eyes may get dry. It may be helpful to use a lubricating eye solution or artificial tears solution. If you are going to have surgery or a procedure that requires contrast dyes, tell your doctor or health care professional that you are taking this medicine. What side effects may I notice from receiving this medicine? Side effects that you should report to your doctor or health care professional as soon as possible: -allergic reactions like skin rash, itching or hives, swelling of the face, lips, or tongue -blue-gray coloring of the skin -blurred vision, seeing blue green halos, increased sensitivity of the eyes to light -breathing problems -chest pain -dark urine -fast, irregular heartbeat -feeling faint or light-headed -intolerance to heat or cold -nausea or vomiting -pain and swelling of the scrotum -pain, tingling, numbness in feet, hands -redness, blistering, peeling or loosening of the skin, including inside the mouth -spitting up blood -stomach pain -sweating -unusual or uncontrolled movements of body -unusually weak or tired -weight gain or loss -yellowing of the eyes or skin Side effects that usually do not require medical attention  (report to your doctor or health care professional if they continue or are bothersome): -change in sex drive or performance -constipation -dizziness -headache -loss of appetite -trouble sleeping This list may not describe all possible side effects. Call your doctor for medical advice about side effects. You may report side effects to FDA at 1-800-FDA-1088. Where should I keep my medicine? Keep out of the reach of children. Store at room temperature between 20 and 25 degrees C (68 and 77 degrees F). Protect from light. Keep container tightly closed. Throw away any unused medicine after the expiration date. NOTE: This sheet is a summary. It may not cover all possible information. If you have questions about this medicine, talk to your doctor, pharmacist, or health care provider.  2017 Elsevier/Gold Standard (2013-09-11 19:48:11)   Dofetilide capsules What is this medicine? DOFETILIDE (doe FET il ide) is an antiarrhythmic drug. It helps make your heart beat regularly. This medicine also helps to slow rapid heartbeats. This medicine may be used for other purposes; ask your health care provider or pharmacist if  you have questions. COMMON BRAND NAME(S): Tikosyn What should I tell my health care provider before I take this medicine? They need to know if you have any of these conditions: -heart disease -history of low levels of potassium or magnesium -kidney disease -liver disease -an unusual or allergic reaction to dofetilide, other medicines, foods, dyes, or preservatives -pregnant or trying to get pregnant -breast-feeding How should I use this medicine? Take this medicine by mouth with a glass of water. Follow the directions on the prescription label. You can take this medicine with or without food. Do not drink grapefruit juice with this medicine. Take your doses at regular intervals. Do not take your medicine more often than directed. Do not stop taking this medicine suddenly. This may  cause serious, heart-related side effects. Your doctor will tell you how much medicine to take. If your doctor wants you to stop the medicine, the dose will be slowly lowered over time to avoid any side effects. Talk to your pediatrician regarding the use of this medicine in children. Special care may be needed. Overdosage: If you think you have taken too much of this medicine contact a poison control center or emergency room at once. NOTE: This medicine is only for you. Do not share this medicine with others. What if I miss a dose? If you miss a dose, take it as soon as you can. If it is almost time for your next dose, take only that dose. Do not take double or extra doses. What may interact with this medicine? Do not take this medicine with any of the following medications: -cimetidine -dolutegravir -hydrochlorothiazide alone or in combination with other medicines -ketoconazole -megestrol -other medicines that prolong the QT interval (cause an abnormal heart rhythm) -prochlorperazine -trimethoprim alone or in combination with sulfamethoxazole -verapamil This medicine may also interact with the following medications: -amiloride -certain antidepressants like fluvoxamine or paroxetine -certain antiviral medicines for HIV or AIDS like atazanavir or darunavir -certain medicines for fungal infections like clotrimazole or miconazole -digoxin -diltiazem -dronabinol, THC -grapefruit juice -metformin -nefazodone -triamterene -zafirlukast This list may not describe all possible interactions. Give your health care provider a list of all the medicines, herbs, non-prescription drugs, or dietary supplements you use. Also tell them if you smoke, drink alcohol, or use illegal drugs. Some items may interact with your medicine. What should I watch for while using this medicine? Visit your doctor or health care professional for regular checks on your progress. Wear a medical ID bracelet or chain, and  carry a card that describes your disease and details of your medicine and dosage times. Check your heart rate and blood pressure regularly while you are taking this medicine. Ask your doctor or health care professional what your heart rate and blood pressure should be, and when you should contact him or her. Your doctor or health care professional also may schedule regular tests to check your progress. You will be started on this medicine in a specialized facility for at least three days. You will be monitored to find the right dose of medicine for you. It is very important that you take your medicine exactly as prescribed when you leave the hospital. The correct dosing of this medicine is very important to treat your condition and prevent possible serious side effects. What side effects may I notice from receiving this medicine? Side effects that you should report to your doctor or health care professional as soon as possible: -allergic reactions like skin rash, itching or hives, swelling  of the face, lips, or tongue -breathing problems -dizziness -fast or rapid beating of the heart -feeling faint or lightheaded -swelling of the ankles -unusually weak or tired -vomiting Side effects that usually do not require medical attention (report to your doctor or health care professional if they continue or are bothersome): -cough -diarrhea -difficulty sleeping -headache -nausea -stomach pain This list may not describe all possible side effects. Call your doctor for medical advice about side effects. You may report side effects to FDA at 1-800-FDA-1088. Where should I keep my medicine? Keep out of the reach of children. Store at room temperature between 15 and 30 degrees C (59 and 86 degrees F). Protect the medicine from moisture or humidity. Keep container tightly closed. Throw away any unused medicine after the expiration date. NOTE: This sheet is a summary. It may not cover all possible  information. If you have questions about this medicine, talk to your doctor, pharmacist, or health care provider.  2017 Elsevier/Gold Standard (2014-01-29 16:21:18)

## 2016-06-23 NOTE — Progress Notes (Signed)
Electrophysiology Office Note   Date:  06/23/2016   ID:  Virginia Huber, Virginia Huber [birth year], MRN [id number]  PCP:  Purvis Kilts, MD  Cardiologist:  Bronson Ing Primary Electrophysiologist:  Constance Haw, MD    No chief complaint on file.    History of Present Illness: Virginia Huber is a 71 y.o. female who presents today for electrophysiology evaluation.   She has a history of CAD status post MI and PCI of the OM1, with an an STEMI 03/2015 status post DES to the circumflex, an STEMI 07/2015 with PCI to the RCA and paroxysmal atrial fibrillation during that admission. She is on Eliquis. Sleep study showed mild sleep apnea. He was in the hospital on 12/17 and was found to be in A. fib with RVR. She converted to sinus rhythm after arrival. He was readmitted to the hospital on 12/23 and was noted to be in atrial flutter with rapid rates at that time. She had junctional rhythm and near-syncope after conversion to sinus rhythm. She had a postconversion pause of 5.6 seconds.   Today, she denies symptoms of palpitations, chest pain, shortness of breath, orthopnea, PND, lower extremity edema, claudication, dizziness, presyncope, syncope, bleeding, or neurologic sequela. The patient is tolerating medications without difficulties and is otherwise without complaint today.    Past Medical History:  Diagnosis Date  . Arthritis   . Atrial fibrillation and flutter ([place])    a. h/o PAF/flutter during admission in 2013 for PNA. b. PAF during adm for NSTEMI 07/2015.  [name] B12 deficiency anemia   . Coronary artery disease 11/30/2014   a. remote MI. b. h/o PTCA with scoring balloon to OM1 11/2014. c. NSTEMI 03/2015 s/p DES to prox-mid Cx. d. NSTEMI 07/2015 s/p scoring balloon/PTCA/DES to dRCA with PAF during that admission  . Cutaneous lupus erythematosus   . GERD (gastroesophageal reflux disease)   . Heart block   . History of blood transfusion 1980's   2nd surgical procedures  . HTN  (hypertension)   . Hypercholesteremia   . Hypothyroidism   . Myocardial infarction 02/2012  . Ovarian tumor   . PAD (peripheral artery disease) ([place])    a. s/p LE angio 2015; followed by Dr. Fletcher Anon - managed medically.  . Pain with urination 05/08/2015  . Superficial fungus infection of skin 06/29/2013  . TIA (transient ischemic attack) 08/2001; ~ 2006  . Type II diabetes mellitus ([place])   . UTI (urinary tract infection) 05/08/2013   Past Surgical History:  Procedure Laterality Date  . ABDOMINAL AORTAGRAM N/A 01/03/2014   Procedure: ABDOMINAL [name];  Surgeon: Wellington Hampshire, MD;  Location: [facility];  Service: Cardiovascular;  Laterality: N/A;  . ABDOMINAL HYSTERECTOMY  1972   "partial"  . APPENDECTOMY  1970's  . CARDIAC CATHETERIZATION  2008   Tiny OM-2 with 90% narrowing. Med tx.  [name] CARDIAC CATHETERIZATION N/A 11/30/2014   Procedure: Left Heart Cath and Coronary Angiography;  Surgeon: Troy Sine, MD; LAD 20%, CFX 50%, OM1 95%, right PLB 30%, LV normal   . CARDIAC CATHETERIZATION N/A 11/30/2014   Procedure: Coronary Balloon Angioplasty;  Surgeon: Troy Sine, MD;  Angiosculpt scoring balloon and PTCA to the OM1 reducing stenosis from 95% to less than 10%  . CARDIAC CATHETERIZATION N/A 04/03/2015   Procedure: Left Heart Cath and Coronary Angiography;  Surgeon: Jolaine Artist, MD; dLAD 50%, CFX 90%, OM1 100%, PLA 15%, LVEDP 13    . CARDIAC CATHETERIZATION N/A 04/03/2015   Procedure: Coronary  Stent Intervention;  Surgeon: Sherren Mocha, MD; 3.0x18 mm Xience DES to the CFX    . CARDIAC CATHETERIZATION N/A 08/02/2015   Procedure: Left Heart Cath and Coronary Angiography;  Surgeon: Troy Sine, MD;  Location: [facility];  Service: Cardiovascular;  Laterality: N/A;  . CARDIAC CATHETERIZATION N/A 08/02/2015   Procedure: Coronary Stent Intervention;  Surgeon: Troy Sine, MD;  Location: [facility];  Service: Cardiovascular;  Laterality: N/A;  . cardiac stents     . CHOLECYSTECTOMY OPEN  1990's  . COLONOSCOPY  2005   Dr. Laural Golden: pancolonic divericula, polyp, path unknown currently  . COLONOSCOPY  2012   Dr. Oneida Alar: Normal TI, scattered diverticula in entire colon, small internal hemorrhoids, normal colon biopsies. Colonoscopy in 5-10 years.   . COLOSTOMY  05/1979  . COLOSTOMY REVERSAL  11/1979  . EXCISIONAL HEMORRHOIDECTOMY  1970's  . EYE SURGERY Left 2000   "branch vein occlusion"  . EYE SURGERY Left ~ 2001   "smoothed out wrinkle"  . LEFT OOPHORECTOMY  05/1979   nicked bowel, peritonitis, colostomy; colostomy reversed 1981   . LOWER EXTREMITY ANGIOGRAM N/A 01/03/2014   Procedure: LOWER EXTREMITY ANGIOGRAM;  Surgeon: Wellington Hampshire, MD;  Location: [facility];  Service: Cardiovascular;  Laterality: N/A;  . Nuclear med stress test  10/2011   Small area of mild ischemia inferoapically.  [name] PARTIAL HYSTERECTOMY  1970's   left ovaries, then ovaries removed later due tumors   . RIGHT OOPHORECTOMY  1970's     Current Outpatient Prescriptions  Medication Sig Dispense Refill  . acetaminophen (TYLENOL) 325 MG tablet Take 650 mg by mouth every 6 (six) hours as needed for headache.    [name] apixaban (ELIQUIS) 5 MG TABS tablet Take 1 tablet (5 mg total) by mouth 2 (two) times daily. 56 tablet 0  . atorvastatin (LIPITOR) 80 MG tablet Take 1 tablet (80 mg total) by mouth daily at 6 PM. (Patient taking differently: Take 80 mg by mouth daily after supper. ) 30 tablet 6  . cholecalciferol (VITAMIN D) 1000 UNITS tablet Take 1,000 Units by mouth at bedtime.     . clopidogrel (PLAVIX) 75 MG tablet Take 1 tablet (75 mg total) by mouth daily. 90 tablet 3  . diltiazem (CARDIZEM CD) 120 MG 24 hr capsule Take 1 capsule (120 mg total) by mouth daily. (Patient taking differently: Take 120 mg by mouth daily after supper. ) 90 capsule 3  . furosemide (LASIX) 20 MG tablet Take 2 tablets (40 mg total) by mouth 2 (two) times daily. (Patient taking differently: Take 40 mg by  mouth daily. ) 60 tablet 6  . insulin NPH-regular Human (NOVOLIN 70/30) (70-30) 100 UNIT/ML injection Inject 30-40 Units into the skin See admin instructions. Inject 30 units before breakfast and 40 units before supper  - only if pre-meal blood glucose is above 90 mg/dL.    [name] labetalol (NORMODYNE) 200 MG tablet Take 2 tablets (400 mg total) by mouth 2 (two) times daily. 120 tablet 6  . levothyroxine (SYNTHROID, LEVOTHROID) 150 MCG tablet Take 150 mcg by mouth daily before breakfast.    . losartan (COZAAR) 100 MG tablet TAKE ONE (1) TABLET BY MOUTH EVERY DAY 90 tablet 3  . magnesium oxide (MAG-OX) 400 MG tablet Take 400 mg by mouth daily.    . metFORMIN (GLUCOPHAGE) 500 MG tablet Take 500 mg by mouth 2 (two) times daily with a meal.    . nitroGLYCERIN (NITROSTAT) 0.4 MG SL tablet  Place 1 tablet (0.4 mg total) under the tongue every 5 (five) minutes as needed for chest pain. 25 tablet 12  . Omega-3 Fatty Acids (FISH OIL) 1200 MG CAPS Take 2 capsules (2,400 mg total) by mouth 2 (two) times daily. 120 each 3  . potassium chloride SA (KLOR-CON M20) 20 MEQ tablet Take 1 tablet (20 mEq total) by mouth daily. (Patient taking differently: Take 20 mEq by mouth daily after supper. ) 90 tablet 3  . rOPINIRole (REQUIP) 0.5 MG tablet Take 0.5 mg by mouth at bedtime.   0  . sodium chloride (OCEAN) 0.65 % SOLN nasal spray Place 1 spray into both nostrils as needed for congestion. (Patient taking differently: Place 1 spray into both nostrils at bedtime as needed for congestion. ) 1 Bottle 0   No current facility-administered medications for this visit.     Allergies:   Penicillins and Percocet [oxycodone-acetaminophen]   Social History:  The patient  reports that she has never smoked. She has never used smokeless tobacco. She reports that she does not drink alcohol or use drugs.   Family History:  The patient's family history includes Diabetes in her brother; Heart disease in her brother, brother, father, mother,  and sister; Lupus in her daughter; Thyroid disease in her brother and brother.    ROS:  Please see the history of present illness.   Otherwise, review of systems is positive for Chest pain, palpitations, dyspnea on exertion, dizziness.   All other systems are reviewed and negative.    PHYSICAL EXAM: VS:  BP (!) 152/56   Pulse 64   Ht 5\' 3"  (1.6 m)   Wt 149 lb 12.8 oz (67.9 kg)   SpO2 98%   BMI 26.54 kg/m  , BMI Body mass index is 26.54 kg/m. GEN: Well nourished, well developed, in no acute distress  HEENT: normal  Neck: no JVD, carotid bruits, or masses Cardiac: RRR; no murmurs, rubs, or gallops,no edema  Respiratory:  clear to auscultation bilaterally, normal work of breathing GI: soft, nontender, nondistended, + BS MS: no deformity or atrophy  Skin: warm and dry Neuro:  Strength and sensation are intact Psych: euthymic mood, full affect  EKG:  EKG is ordered today. Personal review of the ekg ordered shows sinus rhythm, LVH with repolarization abnormality, rate 52  Recent Labs: 03/13/2016: Magnesium 1.9 04/14/2016: TSH 2.36 06/07/2016: B Natriuretic Peptide 155.0 06/14/2016: ALT 15 06/15/2016: BUN 17; Creatinine, Ser 0.90; Hemoglobin 11.8; Platelets 252; Potassium 3.7; Sodium 141    Lipid Panel     Component Value Date/Time   CHOL 116 (L) 04/14/2016 0910   TRIG 439 (H) 04/14/2016 0910   HDL 24 (L) 04/14/2016 0910   CHOLHDL 4.8 04/14/2016 0910   VLDL NOT CALC 04/14/2016 0910   LDLCALC NOT CALC 04/14/2016 0910     Wt Readings from Last 3 Encounters:  06/23/16 149 lb 12.8 oz (67.9 kg)  06/14/16 156 lb 8.4 oz (71 kg)  06/10/16 149 lb (67.6 kg)      Other studies Reviewed: Additional studies/ records that were reviewed today include: TTE 05/24/15, Cath  08/02/15 Review of the above records today demonstrates:  - Left ventricle: The cavity size was normal. Wall thickness was   increased in a pattern of mild LVH. Systolic function was normal.   The estimated  ejection fraction was in the range of 55% to 60%.   Wall motion was normal; there were no regional wall motion   abnormalities. Features are consistent with  a pseudonormal left   ventricular filling pattern, with concomitant abnormal relaxation   and increased filling pressure (grade 2 diastolic dysfunction). - Aortic valve: Mildly calcified annulus. Trileaflet; mildly   thickened leaflets. Valve area (VTI): 2.07 cm^2. Valve area   (Vmax): 1.98 cm^2. - Mitral valve: Mildly calcified annulus. Mildly thickened leaflets - Left atrium: The atrium was severely dilated. - Atrial septum: No defect or patent foramen ovale was identified.    1st RPLB lesion, 50% stenosed.  Dist RCA lesion, 30% stenosed.  Ost 1st Mrg to 1st Mrg lesion, 100% stenosed.  Ost LAD lesion, 40% stenosed.  Mid LAD lesion, 40% stenosed.  Post Atrio lesion, 90% stenosed. Post intervention, there is a 0% residual stenosis.  The left ventricular systolic function is normal.  Mid RCA lesion, 20% stenosed.   Normal LV function without focal segmental wall motion abnormalities and ejection fraction of 55%.  Significant coronary obstructive disease with diffuse mild luminal narrowing of the LAD with 40% proximal and mid stenoses; widely patent stent in the proximal circumflex with old occlusion of the marginal branch which had arisen in the region of the stented segment but with evidence for very faint collateralization to this distal marginal vessel from the the LAD; a large dominant RCA with 20%, mild mid narrowing, 30% narrowing after the acute margin and focal 90% stenosis distally prior to a PDA 2 vessel with 50% narrowing at the ostium of this PDA prior to a bend in the vessel with the RCA ending in the PLA vessel.  Successful percutaneous cardiac intervention to the distal RCA treated with Angiosculpt scoring balloon, PTCA, and ultimate stenting with a 2.58 mm Xience Alpine DES stent postdilated to 2.51 mm with a  90% stenosis reduced to 0% and no change in the ostial PDA narrowing.  ASSESSMENT AND PLAN:  1.  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 Due to that, we have discussed the option of pacemaker placement. Risks and benefits were discussed. Risks include bleeding, tamponade, infection, and pneumothorax. She understands these risks and has agreed to the procedure. We also discussed the possibility of rhythm control for her atrial fibrillation. We Virginia Huber give him information on both amiodarone and dofetilide. They Virginia Huber discuss this and get back to [place] with the decision on whether or not she would like either of these medications.  This patients CHA2DS2-VASc Score and unadjusted Ischemic Stroke Rate (% per year) is equal to 11.2 % stroke rate/year from a score of 7  Above score calculated as 1 point each if present [CHF, HTN, DM, Vascular=MI/PAD/Aortic Plaque, Age if 65-74, or Female] Above score calculated as 2 points each if present [Age > 75, or Stroke/TIA/TE]  2. Coronary artery disease: Currently no chest pain  3. Hypertension: elevated today. We'll restart her amlodipine 10 mg after pacemaker placement.  Current medicines are reviewed at length with the patient today.   The patient does not have concerns regarding her medicines.  The following changes were made today:  none  Labs/ tests ordered today include:  Orders Placed This Encounter  Procedures  . Basic Metabolic Panel (BMET)  . CBC w/Diff  . EKG 12-Lead     Disposition:   FU with Virginia Huber 3 months  Signed, Virginia Huber Meredith Leeds, MD  06/23/2016 3:14 PM     [facility] [address] [place] [address] [phone] (  office) [phone] (fax)

## 2016-06-24 LAB — CBC WITH DIFFERENTIAL/PLATELET
BASOS ABS: 0.1 10*3/uL (ref 0.0–0.2)
Basos: 1 %
EOS (ABSOLUTE): 0.2 10*3/uL (ref 0.0–0.4)
Eos: 3 %
Hematocrit: 37.9 % (ref 34.0–46.6)
Hemoglobin: 12.4 g/dL (ref 11.1–15.9)
Immature Grans (Abs): 0 10*3/uL (ref 0.0–0.1)
Immature Granulocytes: 0 %
LYMPHS ABS: 1.4 10*3/uL (ref 0.7–3.1)
Lymphs: 14 %
MCH: 29.8 pg (ref 26.6–33.0)
MCHC: 32.7 g/dL (ref 31.5–35.7)
MCV: 91 fL (ref 79–97)
MONOS ABS: 0.7 10*3/uL (ref 0.1–0.9)
Monocytes: 8 %
Neutrophils Absolute: 7 10*3/uL (ref 1.4–7.0)
Neutrophils: 74 %
Platelets: 299 10*3/uL (ref 150–379)
RBC: 4.16 x10E6/uL (ref 3.77–5.28)
RDW: 14.9 % (ref 12.3–15.4)
WBC: 9.4 10*3/uL (ref 3.4–10.8)

## 2016-06-24 LAB — BASIC METABOLIC PANEL
BUN / CREAT RATIO: 21 (ref 12–28)
BUN: 21 mg/dL (ref 8–27)
CHLORIDE: 101 mmol/L (ref 96–106)
CO2: 23 mmol/L (ref 18–29)
Calcium: 9.5 mg/dL (ref 8.7–10.3)
Creatinine, Ser: 0.99 mg/dL (ref 0.57–1.00)
GFR calc Af Amer: 66 mL/min/{1.73_m2} (ref >59)
GFR calc non Af Amer: 58 mL/min/{1.73_m2} — ABNORMAL LOW (ref >59)
GLUCOSE: 219 mg/dL — AB (ref 65–99)
POTASSIUM: 4.7 mmol/L (ref 3.5–5.2)
SODIUM: 140 mmol/L (ref 134–144)

## 2016-06-25 ENCOUNTER — Encounter (HOSPITAL_COMMUNITY)
Admission: RE | Disposition: A | Discharge status: Home / Self Care | Payer: Self-pay | Source: Ambulatory Visit | Attending: Cardiology

## 2016-06-25 ENCOUNTER — Inpatient Hospital Stay (HOSPITAL_COMMUNITY)
Admission: RE | Admit: 2016-06-25 | Discharge: 2016-06-28 | DRG: 243 | Disposition: A | Discharge status: Home / Self Care | Payer: PPO | Source: Ambulatory Visit | Attending: Cardiology | Admitting: Cardiology

## 2016-06-25 DIAGNOSIS — I495 Sick sinus syndrome: Principal | ICD-10-CM | POA: Diagnosis present

## 2016-06-25 DIAGNOSIS — T82190A Other mechanical complication of cardiac electrode, initial encounter: Secondary | ICD-10-CM | POA: Diagnosis not present

## 2016-06-25 DIAGNOSIS — E039 Hypothyroidism, unspecified: Secondary | ICD-10-CM | POA: Diagnosis not present

## 2016-06-25 DIAGNOSIS — D519 Vitamin B12 deficiency anemia, unspecified: Secondary | ICD-10-CM | POA: Diagnosis present

## 2016-06-25 DIAGNOSIS — R001 Bradycardia, unspecified: Secondary | ICD-10-CM | POA: Diagnosis present

## 2016-06-25 DIAGNOSIS — Z01810 Encounter for preprocedural cardiovascular examination: Secondary | ICD-10-CM | POA: Diagnosis not present

## 2016-06-25 DIAGNOSIS — I314 Cardiac tamponade: Secondary | ICD-10-CM | POA: Diagnosis not present

## 2016-06-25 DIAGNOSIS — E785 Hyperlipidemia, unspecified: Secondary | ICD-10-CM | POA: Diagnosis present

## 2016-06-25 DIAGNOSIS — I480 Paroxysmal atrial fibrillation: Secondary | ICD-10-CM | POA: Diagnosis not present

## 2016-06-25 DIAGNOSIS — E119 Type 2 diabetes mellitus without complications: Secondary | ICD-10-CM | POA: Diagnosis not present

## 2016-06-25 DIAGNOSIS — I3139 Other pericardial effusion (noninflammatory): Secondary | ICD-10-CM

## 2016-06-25 DIAGNOSIS — I2510 Atherosclerotic heart disease of native coronary artery without angina pectoris: Secondary | ICD-10-CM | POA: Diagnosis not present

## 2016-06-25 DIAGNOSIS — Y831 Surgical operation with implant of artificial internal device as the cause of abnormal reaction of the patient, or of later complication, without mention of misadventure at the time of the procedure: Secondary | ICD-10-CM | POA: Diagnosis not present

## 2016-06-25 DIAGNOSIS — I313 Pericardial effusion (noninflammatory): Secondary | ICD-10-CM

## 2016-06-25 DIAGNOSIS — I4892 Unspecified atrial flutter: Secondary | ICD-10-CM | POA: Diagnosis not present

## 2016-06-25 DIAGNOSIS — I252 Old myocardial infarction: Secondary | ICD-10-CM | POA: Diagnosis not present

## 2016-06-25 DIAGNOSIS — Z955 Presence of coronary angioplasty implant and graft: Secondary | ICD-10-CM

## 2016-06-25 DIAGNOSIS — I10 Essential (primary) hypertension: Secondary | ICD-10-CM | POA: Diagnosis present

## 2016-06-25 DIAGNOSIS — Z8673 Personal history of transient ischemic attack (TIA), and cerebral infarction without residual deficits: Secondary | ICD-10-CM

## 2016-06-25 DIAGNOSIS — I959 Hypotension, unspecified: Secondary | ICD-10-CM | POA: Diagnosis not present

## 2016-06-25 DIAGNOSIS — D62 Acute posthemorrhagic anemia: Secondary | ICD-10-CM | POA: Diagnosis not present

## 2016-06-25 DIAGNOSIS — Z9889 Other specified postprocedural states: Secondary | ICD-10-CM

## 2016-06-25 HISTORY — PX: EP IMPLANTABLE DEVICE: SHX172B

## 2016-06-25 HISTORY — DX: Unspecified atrial flutter: I48.92

## 2016-06-25 HISTORY — DX: Sick sinus syndrome: I49.5

## 2016-06-25 HISTORY — DX: Other pericardial effusion (noninflammatory): I31.39

## 2016-06-25 HISTORY — DX: Cardiac tamponade: I31.4

## 2016-06-25 HISTORY — DX: Pericardial effusion (noninflammatory): I31.3

## 2016-06-25 HISTORY — PX: CARDIAC CATHETERIZATION: SHX172

## 2016-06-25 LAB — PROTIME-INR
INR: 1.25
Prothrombin Time: 15.8 seconds — ABNORMAL HIGH (ref 11.4–15.2)

## 2016-06-25 LAB — GLUCOSE, CAPILLARY
GLUCOSE-CAPILLARY: 224 mg/dL — AB (ref 65–99)
Glucose-Capillary: 220 mg/dL — ABNORMAL HIGH (ref 65–99)
Glucose-Capillary: 243 mg/dL — ABNORMAL HIGH (ref 65–99)
Glucose-Capillary: 340 mg/dL — ABNORMAL HIGH (ref 65–99)

## 2016-06-25 LAB — SURGICAL PCR SCREEN
MRSA, PCR: NEGATIVE
STAPHYLOCOCCUS AUREUS: NEGATIVE

## 2016-06-25 SURGERY — LEAD REVISION/REPAIR

## 2016-06-25 SURGERY — PACEMAKER IMPLANT

## 2016-06-25 MED ORDER — FENTANYL CITRATE (PF) 100 MCG/2ML IJ SOLN
INTRAMUSCULAR | Status: AC
  Filled 2016-06-25: qty 2

## 2016-06-25 MED ORDER — LEVOTHYROXINE SODIUM 75 MCG PO TABS
150.0000 ug | ORAL_TABLET | Freq: Every day | ORAL | Status: DC
  Administered 2016-06-26 – 2016-06-28 (×3): 150 ug via ORAL
  Filled 2016-06-25 (×3): qty 2

## 2016-06-25 MED ORDER — INSULIN ASPART PROT & ASPART (70-30 MIX) 100 UNIT/ML ~~LOC~~ SUSP
30.0000 [IU] | Freq: Two times a day (BID) | SUBCUTANEOUS | Status: DC
  Filled 2016-06-25: qty 10

## 2016-06-25 MED ORDER — VANCOMYCIN HCL IN DEXTROSE 1-5 GM/200ML-% IV SOLN
1000.0000 mg | Freq: Two times a day (BID) | INTRAVENOUS | Status: AC
  Administered 2016-06-25: 1000 mg via INTRAVENOUS
  Filled 2016-06-25: qty 200

## 2016-06-25 MED ORDER — VITAMIN D 1000 UNITS PO TABS
1000.0000 [IU] | ORAL_TABLET | Freq: Every day | ORAL | Status: DC
  Administered 2016-06-25 – 2016-06-27 (×3): 1000 [IU] via ORAL
  Filled 2016-06-25 (×3): qty 1

## 2016-06-25 MED ORDER — SODIUM CHLORIDE 0.9 % IV SOLN
INTRAVENOUS | Status: DC
  Administered 2016-06-25: 08:00:00 via INTRAVENOUS

## 2016-06-25 MED ORDER — ROPINIROLE HCL 1 MG PO TABS
0.5000 mg | ORAL_TABLET | Freq: Every day | ORAL | Status: DC
  Administered 2016-06-25 – 2016-06-27 (×3): 0.5 mg via ORAL
  Filled 2016-06-25 (×3): qty 1

## 2016-06-25 MED ORDER — LIDOCAINE HCL (PF) 1 % IJ SOLN
INTRAMUSCULAR | Status: AC
  Filled 2016-06-25: qty 60

## 2016-06-25 MED ORDER — LABETALOL HCL 200 MG PO TABS
400.0000 mg | ORAL_TABLET | Freq: Two times a day (BID) | ORAL | Status: DC
  Administered 2016-06-25 – 2016-06-26 (×2): 400 mg via ORAL
  Filled 2016-06-25 (×2): qty 2

## 2016-06-25 MED ORDER — LIDOCAINE HCL (PF) 1 % IJ SOLN
INTRAMUSCULAR | Status: DC | PRN
  Administered 2016-06-25: 35 mL via INTRADERMAL

## 2016-06-25 MED ORDER — SODIUM CHLORIDE 0.9 % IR SOLN
Status: AC
  Filled 2016-06-25: qty 2

## 2016-06-25 MED ORDER — AMIODARONE HCL 200 MG PO TABS
400.0000 mg | ORAL_TABLET | Freq: Two times a day (BID) | ORAL | Status: DC
  Administered 2016-06-25 – 2016-06-26 (×2): 400 mg via ORAL
  Filled 2016-06-25 (×2): qty 2

## 2016-06-25 MED ORDER — PROTHROMBIN COMPLEX CONC HUMAN 500 UNITS IV KIT
3258.0000 [IU] | PACK | Status: AC
  Administered 2016-06-25: 3258 [IU] via INTRAVENOUS
  Filled 2016-06-25: qty 130

## 2016-06-25 MED ORDER — SODIUM CHLORIDE 0.9 % IV SOLN
INTRAVENOUS | Status: DC | PRN
  Administered 2016-06-25: 250 mL
  Administered 2016-06-25: 50 mL/h via INTRAVENOUS
  Administered 2016-06-25: 250 mL
  Administered 2016-06-25: 500 mL

## 2016-06-25 MED ORDER — GENTAMICIN SULFATE 40 MG/ML IJ SOLN
80.0000 mg | INTRAMUSCULAR | Status: AC
  Administered 2016-06-25: 80 mg

## 2016-06-25 MED ORDER — LIDOCAINE HCL (PF) 1 % IJ SOLN
INTRAMUSCULAR | Status: DC | PRN
  Administered 2016-06-25: 40 mL via INTRADERMAL

## 2016-06-25 MED ORDER — POTASSIUM CHLORIDE CRYS ER 20 MEQ PO TBCR
20.0000 meq | EXTENDED_RELEASE_TABLET | Freq: Every day | ORAL | Status: DC
  Administered 2016-06-26 – 2016-06-27 (×2): 20 meq via ORAL
  Filled 2016-06-25 (×2): qty 1

## 2016-06-25 MED ORDER — MIDAZOLAM HCL 5 MG/5ML IJ SOLN
INTRAMUSCULAR | Status: DC | PRN
  Administered 2016-06-25 (×4): 1 mg via INTRAVENOUS

## 2016-06-25 MED ORDER — MUPIROCIN 2 % EX OINT
TOPICAL_OINTMENT | CUTANEOUS | Status: AC
  Administered 2016-06-25: 1
  Filled 2016-06-25: qty 22

## 2016-06-25 MED ORDER — HEPARIN (PORCINE) IN NACL 2-0.9 UNIT/ML-% IJ SOLN
INTRAMUSCULAR | Status: DC | PRN
  Administered 2016-06-25: 15:00:00

## 2016-06-25 MED ORDER — COLCHICINE 0.6 MG PO TABS
0.6000 mg | ORAL_TABLET | Freq: Two times a day (BID) | ORAL | Status: DC
  Administered 2016-06-25 – 2016-06-28 (×6): 0.6 mg via ORAL
  Filled 2016-06-25 (×7): qty 1

## 2016-06-25 MED ORDER — NITROGLYCERIN 0.4 MG SL SUBL: 0.4000 mg | SUBLINGUAL_TABLET | SUBLINGUAL | Status: DC | PRN

## 2016-06-25 MED ORDER — HYDROMORPHONE HCL 1 MG/ML IJ SOLN: 0.5000 mg | INTRAMUSCULAR | Status: DC | PRN

## 2016-06-25 MED ORDER — VANCOMYCIN HCL IN DEXTROSE 1-5 GM/200ML-% IV SOLN
INTRAVENOUS | Status: AC
  Filled 2016-06-25: qty 200

## 2016-06-25 MED ORDER — APIXABAN 5 MG PO TABS: 5.0000 mg | ORAL_TABLET | Freq: Two times a day (BID) | ORAL | Status: DC

## 2016-06-25 MED ORDER — FENTANYL CITRATE (PF) 100 MCG/2ML IJ SOLN
INTRAMUSCULAR | Status: DC | PRN
  Administered 2016-06-25 (×2): 25 ug via INTRAVENOUS

## 2016-06-25 MED ORDER — DILTIAZEM HCL ER COATED BEADS 120 MG PO CP24
120.0000 mg | ORAL_CAPSULE | Freq: Every day | ORAL | Status: DC
  Administered 2016-06-26: 120 mg via ORAL
  Filled 2016-06-25: qty 1

## 2016-06-25 MED ORDER — SODIUM CHLORIDE 0.9 % IR SOLN
Status: DC | PRN
  Administered 2016-06-25: 14:00:00

## 2016-06-25 MED ORDER — MAGNESIUM OXIDE 400 (241.3 MG) MG PO TABS
400.0000 mg | ORAL_TABLET | Freq: Every day | ORAL | Status: DC
  Administered 2016-06-25 – 2016-06-28 (×4): 400 mg via ORAL
  Filled 2016-06-25 (×4): qty 1

## 2016-06-25 MED ORDER — ACETAMINOPHEN 325 MG PO TABS
650.0000 mg | ORAL_TABLET | Freq: Four times a day (QID) | ORAL | Status: DC | PRN
  Administered 2016-06-26: 650 mg via ORAL
  Filled 2016-06-25: qty 2

## 2016-06-25 MED ORDER — HEPARIN (PORCINE) IN NACL 2-0.9 UNIT/ML-% IJ SOLN
INTRAMUSCULAR | Status: DC | PRN
  Administered 2016-06-25: 09:00:00

## 2016-06-25 MED ORDER — ATORVASTATIN CALCIUM 80 MG PO TABS
80.0000 mg | ORAL_TABLET | Freq: Every day | ORAL | Status: DC
  Administered 2016-06-26 – 2016-06-27 (×2): 80 mg via ORAL
  Filled 2016-06-25 (×2): qty 1

## 2016-06-25 MED ORDER — FENTANYL CITRATE (PF) 100 MCG/2ML IJ SOLN
25.0000 ug | Freq: Once | INTRAMUSCULAR | Status: AC
  Administered 2016-06-25: 25 ug via INTRAVENOUS

## 2016-06-25 MED ORDER — DOPAMINE-DEXTROSE 3.2-5 MG/ML-% IV SOLN
INTRAVENOUS | Status: AC
  Filled 2016-06-25: qty 250

## 2016-06-25 MED ORDER — FENTANYL CITRATE (PF) 100 MCG/2ML IJ SOLN
25.0000 ug | INTRAMUSCULAR | Status: DC | PRN
  Administered 2016-06-25: 25 ug via INTRAVENOUS

## 2016-06-25 MED ORDER — FUROSEMIDE 40 MG PO TABS
40.0000 mg | ORAL_TABLET | Freq: Every day | ORAL | Status: DC
  Administered 2016-06-25 – 2016-06-28 (×4): 40 mg via ORAL
  Filled 2016-06-25 (×4): qty 1

## 2016-06-25 MED ORDER — METFORMIN HCL 500 MG PO TABS
500.0000 mg | ORAL_TABLET | Freq: Two times a day (BID) | ORAL | Status: DC
  Administered 2016-06-26 – 2016-06-28 (×5): 500 mg via ORAL
  Filled 2016-06-25 (×5): qty 1

## 2016-06-25 MED ORDER — INSULIN ASPART PROT & ASPART (70-30 MIX) 100 UNIT/ML ~~LOC~~ SUSP
30.0000 [IU] | Freq: Every day | SUBCUTANEOUS | Status: DC
  Administered 2016-06-26 – 2016-06-28 (×3): 30 [IU] via SUBCUTANEOUS
  Filled 2016-06-25: qty 10

## 2016-06-25 MED ORDER — KETOROLAC TROMETHAMINE 15 MG/ML IJ SOLN
15.0000 mg | Freq: Once | INTRAMUSCULAR | Status: AC
  Administered 2016-06-25: 15 mg via INTRAVENOUS
  Filled 2016-06-25: qty 1

## 2016-06-25 MED ORDER — ONDANSETRON HCL 4 MG/2ML IJ SOLN: 4.0000 mg | Freq: Four times a day (QID) | INTRAMUSCULAR | Status: DC | PRN

## 2016-06-25 MED ORDER — FENTANYL CITRATE (PF) 100 MCG/2ML IJ SOLN
INTRAMUSCULAR | Status: DC | PRN
  Administered 2016-06-25 (×2): 12.5 ug via INTRAVENOUS

## 2016-06-25 MED ORDER — VANCOMYCIN HCL IN DEXTROSE 1-5 GM/200ML-% IV SOLN
1000.0000 mg | INTRAVENOUS | Status: AC
  Administered 2016-06-25: 1000 mg via INTRAVENOUS

## 2016-06-25 MED ORDER — INSULIN ASPART PROT & ASPART (70-30 MIX) 100 UNIT/ML ~~LOC~~ SUSP
40.0000 [IU] | Freq: Every day | SUBCUTANEOUS | Status: DC
  Administered 2016-06-25 – 2016-06-27 (×3): 40 [IU] via SUBCUTANEOUS
  Filled 2016-06-25: qty 10

## 2016-06-25 MED ORDER — HYDROMORPHONE HCL 1 MG/ML IJ SOLN
INTRAMUSCULAR | Status: AC
  Filled 2016-06-25: qty 1

## 2016-06-25 MED ORDER — HYDROMORPHONE HCL 1 MG/ML IJ SOLN
INTRAMUSCULAR | Status: DC | PRN
  Administered 2016-06-25: 1 mg via INTRAVENOUS

## 2016-06-25 MED ORDER — HEPARIN (PORCINE) IN NACL 2-0.9 UNIT/ML-% IJ SOLN
INTRAMUSCULAR | Status: AC
  Filled 2016-06-25: qty 500

## 2016-06-25 MED ORDER — LIDOCAINE HCL (PF) 1 % IJ SOLN
INTRAMUSCULAR | Status: AC
Start: 2016-06-25 — End: 2016-06-25
  Filled 2016-06-25: qty 30

## 2016-06-25 MED ORDER — PHENYLEPHRINE HCL 10 MG/ML IJ SOLN
0.0000 ug/min | INTRAMUSCULAR | Status: DC
  Filled 2016-06-25: qty 1

## 2016-06-25 MED ORDER — LIDOCAINE HCL (PF) 1 % IJ SOLN
INTRAMUSCULAR | Status: DC | PRN
  Administered 2016-06-25: 45 mL

## 2016-06-25 MED ORDER — LOSARTAN POTASSIUM 50 MG PO TABS
100.0000 mg | ORAL_TABLET | Freq: Every day | ORAL | Status: DC
  Administered 2016-06-26 – 2016-06-28 (×3): 100 mg via ORAL
  Filled 2016-06-25 (×3): qty 2

## 2016-06-25 MED ORDER — EMPTY CONTAINERS FLEXIBLE MISC
50.0000 [IU]/kg | Status: DC
  Filled 2016-06-25: qty 136

## 2016-06-25 MED ORDER — LIDOCAINE HCL (PF) 1 % IJ SOLN
INTRAMUSCULAR | Status: AC
  Filled 2016-06-25: qty 30

## 2016-06-25 MED ORDER — ACETAMINOPHEN 325 MG PO TABS
325.0000 mg | ORAL_TABLET | ORAL | Status: DC | PRN
  Administered 2016-06-27: 650 mg via ORAL
  Filled 2016-06-25: qty 2

## 2016-06-25 MED ORDER — HEPARIN (PORCINE) IN NACL 2-0.9 UNIT/ML-% IJ SOLN
INTRAMUSCULAR | Status: DC | PRN
  Administered 2016-06-25: 14:00:00

## 2016-06-25 MED ORDER — PHENYLEPHRINE 40 MCG/ML (10ML) SYRINGE FOR IV PUSH (FOR BLOOD PRESSURE SUPPORT)
PREFILLED_SYRINGE | INTRAVENOUS | Status: AC
  Filled 2016-06-25: qty 10

## 2016-06-25 MED ORDER — MIDAZOLAM HCL 5 MG/5ML IJ SOLN
INTRAMUSCULAR | Status: AC
  Filled 2016-06-25: qty 5

## 2016-06-25 MED ORDER — IOPAMIDOL (ISOVUE-370) INJECTION 76%
INTRAVENOUS | Status: AC
  Filled 2016-06-25: qty 50

## 2016-06-25 MED ORDER — CLOPIDOGREL BISULFATE 75 MG PO TABS
75.0000 mg | ORAL_TABLET | Freq: Every day | ORAL | Status: DC
  Administered 2016-06-26 – 2016-06-28 (×3): 75 mg via ORAL
  Filled 2016-06-25 (×3): qty 1

## 2016-06-25 SURGICAL SUPPLY — 7 items
CABLE SURGICAL S-101-97-12 (CABLE) ×2 IMPLANT
LEAD CAPSURE NOVUS 45CM (Lead) ×2 IMPLANT
LEAD CAPSURE NOVUS 5076-52CM (Lead) ×2 IMPLANT
PAD DEFIB LIFELINK (PAD) ×2 IMPLANT
PPM ADVISA MRI DR A2DR01 (Pacemaker) ×2 IMPLANT
SHEATH CLASSIC 7F (SHEATH) ×4 IMPLANT
TRAY PACEMAKER INSERTION (PACKS) ×2 IMPLANT

## 2016-06-25 SURGICAL SUPPLY — 8 items
CABLE SURGICAL S-101-97-12 (CABLE) ×2 IMPLANT
EVACUATOR 1/8 PVC DRAIN (DRAIN) ×2 IMPLANT
PACK CARDIAC CATHETERIZATION (CUSTOM PROCEDURE TRAY) ×2 IMPLANT
PAD DEFIB LIFELINK (PAD) ×2 IMPLANT
PERIVAC PERICARDIOCENTESIS 8.3 (TRAY / TRAY PROCEDURE) ×2 IMPLANT
SHEATH PINNACLE 5F 10CM (SHEATH) IMPLANT
SHEATH PINNACLE 6F 10CM (SHEATH) ×2 IMPLANT
TRAY PACEMAKER INSERTION (PACKS) ×2 IMPLANT

## 2016-06-25 NOTE — H&P (Signed)
Virginia Huber presents to the hospital with a history of atrial fibrillation.  She has had multiple hospitalizations in the past for AF with RVR. She has also had post conversion pauses with near syncope.  She presents today for pacemaker placement.  On exam, regular rhythm, no murmurs, lungs clear.  Risks and benefits of the procedure were explained.  Risks include but not limited to bleeding, infection, tamponade, pneumothorax.  The patient understands the risks and has agreed to the procedure.  Will Curt Bears, MD 06/25/2016 8:16 AM

## 2016-06-25 NOTE — Progress Notes (Signed)
Site area: right groin venous sheath Site Prior to Removal:  Level  0 Pressure Applied For: 15 minutes Manual:   yes Patient Status During Pull:  stable Post Pull Site:  Level  0 Post Pull Instructions Given:  yes Post Pull Pulses Present: yes Dressing Applied:  Gauze/tegaderm Bedrest begins @ 6433 Comments:

## 2016-06-25 NOTE — Progress Notes (Signed)
Pt resting comfortably with family at bedside.  Taking liquids without difficulty.

## 2016-06-25 NOTE — Progress Notes (Signed)
Arrived back from lunch to find staff members attending to Virginia Huber.  I big change was evident. She appeared apprehensive face flushed and gripping chest.  C/O chest pain 10/10.  Orders received for iv fentanyl 60mcg iv.  Meds given and IV NS restarted to lock in Rt hand @ 75/hr.  She will return to the lab to evaluate lead placement asap.

## 2016-06-25 NOTE — Progress Notes (Signed)
[name] was seen in the cath lab holding area for chest pain. Her pain was worse with inspiration, improved with shallow breathing, worse with laying flat.  Due to the character of her pain, it was elected to bring her back to the lab for lead revision. Risks and benefits explained to her family.  Risks include but not limied to bleeding, infection, tamponade.  She understands the risks and has agreed to the lead revision procedure.  Allegra Lai, MD

## 2016-06-25 NOTE — Progress Notes (Signed)
Pt taken back to EP lab for possible lead revision repair for chest pain

## 2016-06-25 NOTE — CV Procedure (Signed)
EP procedure Note  Surgeon:  Thompson Grayer MD Assistant:  Dr Burt Knack  Pre procedure Diagnosis:  Pericardial effusion/ tamponade Post procedure Diagnosis:  Same  Procedures:  Emergent pericardiocentesis  Introduction:  LIAHNA BRICKNER is a 71 y.o. female s/p dual chamber permanent pacemaker implanted earlier today by Dr Curt Bears.  She was brought back to EP lab by Dr Curt Bears for lead revision after developing pleuritic chest pain.  Both atrial and ventricular leads repositioned successfully.   She continued to complaint of chest pain and became progressively hypotensive.  Emergent pericardiocentesis is therefore performed.  Description:   Adequate IV acces and airway support were assured.  Limited bedside echo was performed by me which revealed a large pericardial effusion with early tamponade.  The patient was urgently prepped and draped in a standard fashion.  A 6 French hemostasis sheath was placed into the right common femoral vein using a modified Seldinger technique.  K Centra was requested stat to the EP lab for reversal of anticoagulation. A standard subxiphoid approach was used with fluoroscopic visualization in order to achieve access to the pericardial space.  A pericardial drain was placed into the pericardium.  Pericardial drain access was confirmed with fluoroscopy.  500 cc of serosanguinous fluid was immediately removed from the pericardial space.  The patient has immediate improvement in hemodynamics.  A Vacutainer was connected to the pericardial drain and the drain was sutured into a stable position.  Groin sheath was also sutured in place.  The patient was transfer to CCU for close observation.  Repeat limited echo by me confirmed pericardial drain placement to be stable and the effusion to be successfully drained.   Conclusions:  1.  Successful pericardial drain placement with successful pericardiocentesis   Lara Palinkas,MD 5:03 PM 06/25/2016

## 2016-06-26 ENCOUNTER — Ambulatory Visit (HOSPITAL_COMMUNITY): Payer: PPO

## 2016-06-26 ENCOUNTER — Encounter (HOSPITAL_COMMUNITY): Payer: Self-pay | Admitting: Cardiology

## 2016-06-26 ENCOUNTER — Ambulatory Visit (HOSPITAL_BASED_OUTPATIENT_CLINIC_OR_DEPARTMENT_OTHER): Payer: PPO

## 2016-06-26 ENCOUNTER — Inpatient Hospital Stay (HOSPITAL_COMMUNITY): Payer: PPO

## 2016-06-26 DIAGNOSIS — E119 Type 2 diabetes mellitus without complications: Secondary | ICD-10-CM | POA: Diagnosis not present

## 2016-06-26 DIAGNOSIS — I4892 Unspecified atrial flutter: Secondary | ICD-10-CM | POA: Diagnosis not present

## 2016-06-26 DIAGNOSIS — I313 Pericardial effusion (noninflammatory): Secondary | ICD-10-CM | POA: Diagnosis present

## 2016-06-26 DIAGNOSIS — Z01810 Encounter for preprocedural cardiovascular examination: Secondary | ICD-10-CM | POA: Diagnosis not present

## 2016-06-26 DIAGNOSIS — Z955 Presence of coronary angioplasty implant and graft: Secondary | ICD-10-CM | POA: Diagnosis not present

## 2016-06-26 DIAGNOSIS — I252 Old myocardial infarction: Secondary | ICD-10-CM | POA: Diagnosis not present

## 2016-06-26 DIAGNOSIS — E785 Hyperlipidemia, unspecified: Secondary | ICD-10-CM | POA: Diagnosis not present

## 2016-06-26 DIAGNOSIS — I480 Paroxysmal atrial fibrillation: Secondary | ICD-10-CM | POA: Diagnosis not present

## 2016-06-26 DIAGNOSIS — Z950 Presence of cardiac pacemaker: Secondary | ICD-10-CM | POA: Diagnosis not present

## 2016-06-26 DIAGNOSIS — J90 Pleural effusion, not elsewhere classified: Secondary | ICD-10-CM | POA: Diagnosis not present

## 2016-06-26 DIAGNOSIS — I959 Hypotension, unspecified: Secondary | ICD-10-CM | POA: Diagnosis not present

## 2016-06-26 DIAGNOSIS — I2510 Atherosclerotic heart disease of native coronary artery without angina pectoris: Secondary | ICD-10-CM | POA: Diagnosis not present

## 2016-06-26 DIAGNOSIS — D519 Vitamin B12 deficiency anemia, unspecified: Secondary | ICD-10-CM | POA: Diagnosis not present

## 2016-06-26 DIAGNOSIS — I10 Essential (primary) hypertension: Secondary | ICD-10-CM | POA: Diagnosis not present

## 2016-06-26 DIAGNOSIS — Z8673 Personal history of transient ischemic attack (TIA), and cerebral infarction without residual deficits: Secondary | ICD-10-CM | POA: Diagnosis not present

## 2016-06-26 DIAGNOSIS — T82190A Other mechanical complication of cardiac electrode, initial encounter: Secondary | ICD-10-CM | POA: Diagnosis not present

## 2016-06-26 DIAGNOSIS — I495 Sick sinus syndrome: Secondary | ICD-10-CM | POA: Diagnosis not present

## 2016-06-26 DIAGNOSIS — I314 Cardiac tamponade: Secondary | ICD-10-CM | POA: Diagnosis not present

## 2016-06-26 DIAGNOSIS — E039 Hypothyroidism, unspecified: Secondary | ICD-10-CM | POA: Diagnosis not present

## 2016-06-26 DIAGNOSIS — D62 Acute posthemorrhagic anemia: Secondary | ICD-10-CM | POA: Diagnosis not present

## 2016-06-26 DIAGNOSIS — I3139 Other pericardial effusion (noninflammatory): Secondary | ICD-10-CM | POA: Diagnosis present

## 2016-06-26 DIAGNOSIS — Y831 Surgical operation with implant of artificial internal device as the cause of abnormal reaction of the patient, or of later complication, without mention of misadventure at the time of the procedure: Secondary | ICD-10-CM | POA: Diagnosis not present

## 2016-06-26 LAB — CBC
HEMATOCRIT: 29.1 % — AB (ref 36.0–46.0)
Hemoglobin: 9.5 g/dL — ABNORMAL LOW (ref 12.0–15.0)
MCH: 29.6 pg (ref 26.0–34.0)
MCHC: 32.6 g/dL (ref 30.0–36.0)
MCV: 90.7 fL (ref 78.0–100.0)
PLATELETS: 212 10*3/uL (ref 150–400)
RBC: 3.21 MIL/uL — ABNORMAL LOW (ref 3.87–5.11)
RDW: 14 % (ref 11.5–15.5)
WBC: 10.5 10*3/uL (ref 4.0–10.5)

## 2016-06-26 LAB — GLUCOSE, CAPILLARY
GLUCOSE-CAPILLARY: 185 mg/dL — AB (ref 65–99)
GLUCOSE-CAPILLARY: 227 mg/dL — AB (ref 65–99)
Glucose-Capillary: 219 mg/dL — ABNORMAL HIGH (ref 65–99)

## 2016-06-26 LAB — PROTIME-INR
INR: 1.22
Prothrombin Time: 15.5 seconds — ABNORMAL HIGH (ref 11.4–15.2)

## 2016-06-26 MED ORDER — ORAL CARE MOUTH RINSE
15.0000 mL | Freq: Two times a day (BID) | OROMUCOSAL | Status: DC
  Administered 2016-06-26: 15 mL via OROMUCOSAL

## 2016-06-26 MED ORDER — KETOROLAC TROMETHAMINE 15 MG/ML IJ SOLN
15.0000 mg | Freq: Once | INTRAMUSCULAR | Status: AC
  Administered 2016-06-26: 15 mg via INTRAVENOUS
  Filled 2016-06-26: qty 1

## 2016-06-26 MED ORDER — KETOROLAC TROMETHAMINE 30 MG/ML IJ SOLN
INTRAMUSCULAR | Status: AC
  Filled 2016-06-26: qty 1

## 2016-06-26 MED ORDER — METOPROLOL TARTRATE 50 MG PO TABS
50.0000 mg | ORAL_TABLET | Freq: Four times a day (QID) | ORAL | Status: DC
  Administered 2016-06-26 – 2016-06-27 (×4): 50 mg via ORAL
  Filled 2016-06-26 (×4): qty 1

## 2016-06-26 NOTE — Progress Notes (Signed)
Pericardial drain pulled. Bedside echo both before and after with minimal pericardial effusion. Dressed with 4x4 and tegaderm. Plan for CXR today and limited echo in the AM to recheck effusion. If feeling well Dustan Hyams plan for discharge tomorrow.  Chantay Whitelock Curt Bears, MD 06/26/2016 2:13 PM

## 2016-06-26 NOTE — Progress Notes (Signed)
Orthopedic Tech Progress Note Patient Details:  Virginia Huber [birth year] [id number]  Ortho Devices Type of Ortho Device: Arm sling   [name] 06/26/2016, 10:36 AM

## 2016-06-26 NOTE — Progress Notes (Signed)
SUBJECTIVE: The patient is doing well today.  At this time, she denies chest pain, shortness of breath, or any new concerns.  [name] amiodarone  400 mg Oral BID  . apixaban  5 mg Oral BID  . atorvastatin  80 mg Oral QPC supper  . cholecalciferol  1,000 Units Oral QHS  . clopidogrel  75 mg Oral Daily  . colchicine  0.6 mg Oral BID  . diltiazem  120 mg Oral QPC supper  . furosemide  40 mg Oral Daily  . insulin aspart protamine- aspart  40 Units Subcutaneous Q supper   And  . insulin aspart protamine- aspart  30 Units Subcutaneous Q breakfast  . labetalol  400 mg Oral BID  . levothyroxine  150 mcg Oral QAC breakfast  . losartan  100 mg Oral Daily  . magnesium oxide  400 mg Oral Daily  . metFORMIN  500 mg Oral BID WC  . potassium chloride SA  20 mEq Oral QPC supper  . rOPINIRole  0.5 mg Oral QHS   . phenylephrine (NEO-SYNEPHRINE) Adult infusion      OBJECTIVE: Physical Exam: Vitals:   06/26/16 0400 06/26/16 0500 06/26/16 0600 06/26/16 0700  BP: (!) 134/53 (!) 130/48 (!) 117/48 (!) 120/51  Pulse: (!) 59 (!) 59 60 (!) 59  Resp: 19 19 (!) 21 19  Temp: 97.9 F (36.6 C)     TempSrc: Oral     SpO2: 99% 98% 98% 97%  Weight:      Height:        Intake/Output Summary (Last 24 hours) at 06/26/16 1607 Last data filed at 06/26/16 0600  Gross per 24 hour  Intake            267.5 ml  Output             1810 ml  Net          -1542.5 ml    Telemetry is reviewed by myself is A paced, V sensed  GEN- The patient is well appearing, alert and oriented x 3 today.   Head- normocephalic, atraumatic Eyes-  Sclera clear, conjunctiva pink Ears- hearing intact Oropharynx- clear Neck- supple, no JVP Lungs- Clear to ausculation bilaterally, normal work of breathing Heart- Regular rate and rhythm, no significant murmurs, no rubs or gallops GI- soft, NT, ND Extremities- no clubbing, cyanosis, or edema Skin- no rash or lesion Psych- euthymic mood, full affect Neuro- no gross deficits  appreciated  PPM implant site is stable, dry, no hematoma Drain site is dry, stable  LABS: Basic Metabolic Panel:  Recent Labs  06/23/16 1550  NA 140  K 4.7  CL 101  CO2 23  GLUCOSE 219*  BUN 21  CREATININE 0.99  CALCIUM 9.5   CBC:  Recent Labs  06/23/16 1550 06/26/16 0204  WBC 9.4 10.5  NEUTROABS 7.0  --   HGB  --  9.5*  HCT 37.9 29.1*  MCV 91 90.7  PLT 299 212    RADIOLOGY: Dg Chest 2 View Result Date: 06/26/2016 CLINICAL DATA:  Pacemaker placement EXAM: CHEST  2 VIEW COMPARISON:  06/13/2016 chest radiograph. FINDINGS: Interval placement of 2 lead left subclavian pacemaker with lead tips overlying the right atrium and right ventricular apex. Inferior approach catheter terminates over the left mediastinum. Stable cardiomediastinal silhouette with mild cardiomegaly. No pneumothorax. Trace bilateral pleural effusions. No pulmonary edema. Mild bibasilar atelectasis. IMPRESSION: 1. Well-positioned 2 lead left subclavian pacemaker. No pneumothorax. 2. Mild cardiomegaly without pulmonary edema. 3.  Trace bilateral pleural effusions with mild bibasilar atelectasis. Electronically Signed   By: Ilona Sorrel M.D.   On: 06/26/2016 07:34      ASSESSMENT AND PLAN:   1. Tachy-brady syndrome      S/p PPM implant yesterday      Lead perforation w/tamponade      S/p pericardial tap/drain yesterday post procedure Bedside echo in process, remains with small pericardial effusion, 150ml drained since initial tap Rahshawn Remo follow this morning, if no significant drainage Kaelyn Innocent remove this afternoon.  CXR without pneumothorax Device interrogation this morning with normal function  2. Paroxysmal AFib     Hold Eliquis this morning with plans to remove drain     A paced, amio started last evening  3. HTN     BP stable  4. CAD     PCI w/DES  to distal RCA Feb 2017     PCI w/DESto mid LCx Oct 2016     PCI to 1st OM PTCA/angiosculpt June 2016     On Plavix   Tommye Standard, PA-C 06/26/2016  8:21 AM  I have seen and examined this patient with Tommye Standard.  Agree with above, note added to reflect my findings.  On exam, regular rhythm, no murmurs, lungs clear. Feeling much improved this AM with no chest pain, just discomfort at the tube site. TTE with small effusion today, and minimal drainage from the tube.  Walt Geathers check a bedside TTE again this afternoon and potentially pull the tube if drainage has slowed.  Has had 60cc out since 22:00.  Continue to hold eliquis.  Have started amiodarone for rhythm control of AF.      Verlin Duke M. Stephenia Vogan MD 06/26/2016 9:25 AM

## 2016-06-26 NOTE — Progress Notes (Signed)
Paged Fudim, Cards Fellow, regarding patient conversion to Afib with HR in the 120s-130s and current mid chest pain that did not subside with pain medication, lopressor, and 2L [place].   Verbal orders to give midnight lopressor at 2300, get EKG if HR increases and continue to monitor patient. No new orders regarding chest pain.

## 2016-06-26 NOTE — Progress Notes (Signed)
  Echocardiogram 2D Echocardiogram has been performed.  Virginia Huber 06/26/2016, 9:08 AM

## 2016-06-27 ENCOUNTER — Inpatient Hospital Stay (HOSPITAL_COMMUNITY): Payer: PPO

## 2016-06-27 DIAGNOSIS — Z01810 Encounter for preprocedural cardiovascular examination: Secondary | ICD-10-CM

## 2016-06-27 DIAGNOSIS — I313 Pericardial effusion (noninflammatory): Secondary | ICD-10-CM

## 2016-06-27 LAB — ECHOCARDIOGRAM LIMITED
CHL CUP DOP CALC LVOT VTI: 16.4 cm
FS: 29 % (ref 28–44)
Height: 63 in
IV/PV OW: 1.26
LA ID, A-P, ES: 41 mm
LA diam index: 2.37 cm/m2
LA vol A4C: 40.2 ml
LDCA: 3.46 cm2
LEFT ATRIUM END SYS DIAM: 41 mm
LVOT diameter: 21 mm
LVOTPV: 99.8 cm/s
LVOTSV: 57 mL
PW: 12.5 mm — AB (ref 0.6–1.1)
RV LATERAL S' VELOCITY: 9.46 cm/s
TAPSE: 14.1 mm
WEIGHTICAEL: 2451.52 [oz_av]

## 2016-06-27 LAB — GLUCOSE, CAPILLARY
GLUCOSE-CAPILLARY: 165 mg/dL — AB (ref 65–99)
Glucose-Capillary: 169 mg/dL — ABNORMAL HIGH (ref 65–99)
Glucose-Capillary: 229 mg/dL — ABNORMAL HIGH (ref 65–99)

## 2016-06-27 MED ORDER — METOPROLOL TARTRATE 50 MG PO TABS
100.0000 mg | ORAL_TABLET | Freq: Two times a day (BID) | ORAL | Status: DC
  Administered 2016-06-27 – 2016-06-28 (×2): 100 mg via ORAL
  Filled 2016-06-27 (×2): qty 2

## 2016-06-27 MED ORDER — DILTIAZEM HCL ER COATED BEADS 240 MG PO CP24
240.0000 mg | ORAL_CAPSULE | Freq: Every day | ORAL | Status: DC
  Administered 2016-06-27: 240 mg via ORAL
  Filled 2016-06-27 (×2): qty 1

## 2016-06-27 MED ORDER — DILTIAZEM HCL ER 60 MG PO CP12
60.0000 mg | ORAL_CAPSULE | Freq: Once | ORAL | Status: AC
  Administered 2016-06-27: 60 mg via ORAL
  Filled 2016-06-27: qty 1

## 2016-06-27 NOTE — Progress Notes (Addendum)
SUBJECTIVE: The patient is doing well today.  At this time, she denies chest pain, shortness of breath, or any new concerns. Feeling much improved since the drain was pulled.  Did go into AF with RVR and had chest discomfort with that rhythm. Has since converted to atrial flutter and feeling much improved with rates around 115.  [name] atorvastatin  80 mg Oral QPC supper  . cholecalciferol  1,000 Units Oral QHS  . clopidogrel  75 mg Oral Daily  . colchicine  0.6 mg Oral BID  . diltiazem  120 mg Oral QPC supper  . furosemide  40 mg Oral Daily  . insulin aspart protamine- aspart  40 Units Subcutaneous Q supper   And  . insulin aspart protamine- aspart  30 Units Subcutaneous Q breakfast  . levothyroxine  150 mcg Oral QAC breakfast  . losartan  100 mg Oral Daily  . magnesium oxide  400 mg Oral Daily  . mouth rinse  15 mL Mouth Rinse BID  . metFORMIN  500 mg Oral BID WC  . metoprolol tartrate  50 mg Oral Q6H  . potassium chloride SA  20 mEq Oral QPC supper  . rOPINIRole  0.5 mg Oral QHS   . phenylephrine (NEO-SYNEPHRINE) Adult infusion      OBJECTIVE: Physical Exam: Vitals:   06/27/16 0500 06/27/16 0600 06/27/16 0700 06/27/16 0735  BP: 120/67 (!) 117/56 118/64 118/64  Pulse: (!) 121 (!) 119 (!) 136 (!) 117  Resp: (!) 28 (!) 28 (!) 25   Temp:      TempSrc:      SpO2: 97% 96% 98%   Weight:      Height:        Intake/Output Summary (Last 24 hours) at 06/27/16 0816 Last data filed at 06/27/16 0400  Gross per 24 hour  Intake              720 ml  Output             1685 ml  Net             -965 ml    Telemetry is reviewed by myself is A paced, V sensed  GEN- The patient is well appearing, alert and oriented x 3 today.   Head- normocephalic, atraumatic Eyes-  Sclera clear, conjunctiva pink Ears- hearing intact Oropharynx- clear Neck- supple, no JVP Lungs- Clear to ausculation bilaterally, normal work of breathing Heart- Tachycardic rate and rhythm, no significant murmurs,  no rubs or gallops GI- soft, NT, ND Extremities- no clubbing, cyanosis, or edema Skin- no rash or lesion Psych- euthymic mood, full affect Neuro- no gross deficits appreciated  PPM implant site is stable, dry, no hematoma Drain site is dry, stable  LABS: Basic Metabolic Panel: No results for input(s): NA, K, CL, CO2, GLUCOSE, BUN, CREATININE, CALCIUM, MG, PHOS in the last 72 hours. CBC:  Recent Labs  06/26/16 0204  WBC 10.5  HGB 9.5*  HCT 29.1*  MCV 90.7  PLT 212    RADIOLOGY: Dg Chest 2 View Result Date: 06/26/2016 CLINICAL DATA:  Pacemaker placement EXAM: CHEST  2 VIEW COMPARISON:  06/13/2016 chest radiograph. FINDINGS: Interval placement of 2 lead left subclavian pacemaker with lead tips overlying the right atrium and right ventricular apex. Inferior approach catheter terminates over the left mediastinum. Stable cardiomediastinal silhouette with mild cardiomegaly. No pneumothorax. Trace bilateral pleural effusions. No pulmonary edema. Mild bibasilar atelectasis. IMPRESSION: 1. Well-positioned 2 lead left subclavian pacemaker. No pneumothorax. 2. Mild  cardiomegaly without pulmonary edema. 3. Trace bilateral pleural effusions with mild bibasilar atelectasis. Electronically Signed   By: Ilona Sorrel M.D.   On: 06/26/2016 07:34      ASSESSMENT AND PLAN:   1. Tachy-brady syndrome      S/p PPM implant yesterday      Lead perforation w/tamponade      S/p pericardial tap/drain yesterday post procedure Bedside TTE pending for improvement in effusion Sandara Tyree plan to transfer to floor  2. Paroxysmal AFib/flutter     Hold Eliquis this morning with plans to remove drain. Keontay Vora restart Eliquis Monday with plans for TEE/CV on Wednesday if she remains in atrial flutter.     Holding amiodarone today as she is not anticoagulated. Ezrah Dembeck adjust rate control. Once anticoagulated x3 weeks vs TEE rules out LAA clot, Valine Drozdowski plan to start amiodarone.   3. HTN     BP stable  4. CAD     PCI w/DES   to distal RCA Feb 2017     PCI w/DESto mid LCx Oct 2016     PCI to 1st OM PTCA/angiosculpt June 2016     On Plavix   Anden Bartolo M. Daril Warga MD 06/27/2016 8:16 AM

## 2016-06-27 NOTE — Plan of Care (Signed)
Problem: Pain Managment: Goal: General experience of comfort will improve Outcome: Progressing Pt will able to indicate need for pain medicine and when to notify if having chest discomfort or pain in any other areas. Will continue to monitor.

## 2016-06-28 ENCOUNTER — Encounter (HOSPITAL_COMMUNITY): Payer: Self-pay | Admitting: Physician Assistant

## 2016-06-28 DIAGNOSIS — I4892 Unspecified atrial flutter: Secondary | ICD-10-CM

## 2016-06-28 DIAGNOSIS — Z9889 Other specified postprocedural states: Secondary | ICD-10-CM

## 2016-06-28 DIAGNOSIS — D62 Acute posthemorrhagic anemia: Secondary | ICD-10-CM

## 2016-06-28 HISTORY — DX: Other specified postprocedural states: Z98.890

## 2016-06-28 HISTORY — DX: Acute posthemorrhagic anemia: D62

## 2016-06-28 LAB — CBC
HCT: 26.1 % — ABNORMAL LOW (ref 36.0–46.0)
Hemoglobin: 8.6 g/dL — ABNORMAL LOW (ref 12.0–15.0)
MCH: 29.9 pg (ref 26.0–34.0)
MCHC: 33 g/dL (ref 30.0–36.0)
MCV: 90.6 fL (ref 78.0–100.0)
PLATELETS: 208 10*3/uL (ref 150–400)
RBC: 2.88 MIL/uL — AB (ref 3.87–5.11)
RDW: 14.1 % (ref 11.5–15.5)
WBC: 9.2 10*3/uL (ref 4.0–10.5)

## 2016-06-28 LAB — GLUCOSE, CAPILLARY: Glucose-Capillary: 164 mg/dL — ABNORMAL HIGH (ref 65–99)

## 2016-06-28 MED ORDER — COLCHICINE 0.6 MG PO TABS: 0.6000 mg | ORAL_TABLET | Freq: Every day | ORAL | 0 refills | Status: DC

## 2016-06-28 MED ORDER — METOPROLOL TARTRATE 100 MG PO TABS: 100.0000 mg | ORAL_TABLET | Freq: Two times a day (BID) | ORAL | 3 refills | Status: DC

## 2016-06-28 MED ORDER — FUROSEMIDE 40 MG PO TABS: 40.0000 mg | ORAL_TABLET | Freq: Every day | ORAL | 3 refills | Status: DC

## 2016-06-28 MED ORDER — DILTIAZEM HCL ER COATED BEADS 240 MG PO CP24: 240.0000 mg | ORAL_CAPSULE | Freq: Every day | ORAL | 3 refills | Status: DC

## 2016-06-28 NOTE — Progress Notes (Signed)
DC orders received.  Patient stable with no S/S of distress.  Medication, discharge information & pacemaker site care reviewed with patient and husband.  Patient DC home with husband. [place], Ardeth Sportsman

## 2016-06-28 NOTE — Discharge Instructions (Signed)
° ° °  Supplemental Discharge Instructions for  Pacemaker Patients  Activity No heavy lifting or vigorous activity with your left/right arm for 6 to 8 weeks.  Do not raise your left/right arm above your head for one week.  Gradually raise your affected arm as drawn below.             06/29/16                       06/30/16                      07/01/16                 07/02/16 __  NO DRIVING until cleared by your doctor  WOUND CARE - Keep the wound area clean and dry.  Do not get this area wet for one week. No showers for one week; you may shower on 07/02/16    . - The tape/steri-strips on your wound will fall off; do not pull them off.  No bandage is needed on the site.  DO  NOT apply any creams, oils, or ointments to the wound area. - If you notice any drainage or discharge from the wound, any swelling or bruising at the site, or you develop a fever > 101? F after you are discharged home, call the office at once.  Special Instructions - You are still able to use cellular telephones; use the ear opposite the side where you have your pacemaker/defibrillator.  Avoid carrying your cellular phone near your device. - When traveling through airports, show security personnel your identification card to avoid being screened in the metal detectors.  Ask the security personnel to use the hand wand. - Avoid arc welding equipment, MRI testing (magnetic resonance imaging), TENS units (transcutaneous nerve stimulators).  Call the office for questions about other devices. - Avoid electrical appliances that are in poor condition or are not properly grounded. - Microwave ovens are safe to be near or to operate.

## 2016-06-28 NOTE — Discharge Summary (Signed)
Discharge Summary    Patient ID: Virginia Huber,  MRN: [id number], DOB/AGE: 72-Feb-1947 72 y.o.  Admit date: 06/25/2016 Discharge date: 06/28/2016  Primary Care Provider: Sharilyn Sites CABOT Primary Cardiologist: Koneswaran/Camnitz  Discharge Diagnoses    Principal Problem:   Tachy-brady syndrome [facility]) Active Problems:   Tamponade   Pericardial effusion   Essential hypertension   Paroxysmal atrial fibrillation with RVR ([place])   CAD in native artery   Junctional bradycardia   Paroxysmal atrial flutter ([facility])   S/P pericardiocentesis   Acute blood loss anemia    Diagnostic Studies/Procedures    1. PPM insertion with subsequent pericardiocentesis, see full report for details. 2. Multiple echocardiograms this admission as summarized below. _____________     History of Present Illness     Virginia Huber is a 71 y.o. female with history of CAD (remote MI; sp PTCA with scoring balloon to OM1 11/2014, NSTEMI 03/2015 s/p DES to prox-mid Cx, NSTEMI 07/2015 s/p scoring balloon/PTCA/DES to dRCA with PAF during that admission), remote history of transient atrial fib/flutter in 2013 during admission for PNA with subsequent recurrences, LE PAD (followed expectantly by Dr. Fletcher Anon), HTN, HLD, TIAs, DM, heart block, B12 deficiency anemia, hypothyroidism, who presented to [facility] for planned pacemaker implantation.    Hospital Course    She had recently been admitted 05/2016 for near-syncope in the setting of atrial flutter RVR with post-conversion pauses and junctional bradycardia. She was felt to benefit from [facility] implantation for tachy-brady syndrome. On 06/26/15 she underwent  implantation of a Medtronic Advisa DR MRI SureScan model [id number] (serial number [id number]) pacemaker. Post-procedurally she developed pleuritic chest discomfort and hypotension. She was found to have pericardial effusion with tamponade with lead perforation. She underwent emergent pericardiocentesis later that afternoon  with subsequent pericardial drain placed. She was noted to have paroxysms of atrial fib and atrial flutter thereafter. F/u limited echo 06/28/15 showed EF 55%, small free-flowing pericardial effusion. Dr. Curt Bears recommended to increase rate control, and to continue to hold amiodarone given that there had been an interruption in her anticoagulation. He recommends to resume Eliquis tomorrow evening (06/29/16) with plans for TEE/CV on Thursday 07/02/16 if she remains in atrial fibrillation. It was recommended to continue colchicine 0.6mg  daily for the next 2 weeks. Dr. Curt Bears has seen and examined the patient today and feels he is stable for discharge. Per our discussion, he will be speaking with his nurse tomorrow to help arrange early follow-up with TEE/DCCV on Thursday. He may consider doing a CBC prior to procedure to ensure stable. She had anemia this admission felt likely ABL in setting of pericardial effusion. HER AMIODARONE will need to be restarted if no clot on TEE.  Tentatively she has wound check f/u on 07/08/16 at 2pm - I have sent a message to Dr. Curt Bears nurse to help find out at time of TEE/DCCV arrangements when Dr. Curt Bears decides the patient should have continued OP follow-up aside from wound check. She has been asked not to drive until cleared by her cardiologist.  Consultants: N/A  _____________  Discharge Vitals Blood pressure 114/81, pulse (!) 42, temperature 98.6 F (37 C), resp. rate (!) 21, height 5\' 3"  (1.6 m), weight 153 lb 3.5 oz (69.5 kg), SpO2 94 %.  Filed Weights   06/25/16 0743 06/25/16 2000  Weight: 150 lb (68 kg) 153 lb 3.5 oz (69.5 kg)    Labs & Radiologic Studies    CBC  Recent Labs  06/26/16 0204 06/28/16 0246  WBC 10.5 9.2  HGB 9.5* 8.6*  HCT 29.1* 26.1*  MCV 90.7 90.6  PLT 212 208  _____________  Dg Chest 2 View  Result Date: 06/26/2016 CLINICAL DATA:  Pacemaker placement EXAM: CHEST  2 VIEW COMPARISON:  06/13/2016 chest radiograph. FINDINGS: Interval  placement of 2 lead left subclavian pacemaker with lead tips overlying the right atrium and right ventricular apex. Inferior approach catheter terminates over the left mediastinum. Stable cardiomediastinal silhouette with mild cardiomegaly. No pneumothorax. Trace bilateral pleural effusions. No pulmonary edema. Mild bibasilar atelectasis. IMPRESSION: 1. Well-positioned 2 lead left subclavian pacemaker. No pneumothorax. 2. Mild cardiomegaly without pulmonary edema. 3. Trace bilateral pleural effusions with mild bibasilar atelectasis. Electronically Signed   By: Ilona Sorrel M.D.   On: 06/26/2016 07:34   Dg Chest 2 View  Result Date: 06/07/2016 CLINICAL DATA:  Initial evaluation for acute chest pain, shortness of breath. EXAM: CHEST  2 VIEW COMPARISON:  Prior radiograph from 10/28/2015. FINDINGS: The cardiac and mediastinal silhouettes are stable in size and contour, and remain within normal limits. Mild elevation the right hemidiaphragm, stable. No airspace consolidation, pleural effusion, or pulmonary edema is identified. There is no pneumothorax. No acute osseous abnormality identified. IMPRESSION: No active cardiopulmonary disease. Electronically Signed   By: Jeannine Boga M.D.   On: 06/07/2016 02:05   Dg Chest Port 1 View  Result Date: 06/26/2016 CLINICAL DATA:  Pericardial effusion, post pacemaker placement EXAM: PORTABLE CHEST 1 VIEW COMPARISON:  06/26/2016 FINDINGS: Cardiomegaly again noted. The mediastinal catheter has been removed. No infiltrate or pulmonary edema. Duo lead left subclavian approach pacemaker with leads in right atrium and right ventricle is unchanged in position. There is no pneumothorax. IMPRESSION: No active disease. Stable dual lead cardiac pacemaker position. No pneumothorax. Electronically Signed   By: Lahoma Crocker M.D.   On: 06/26/2016 15:50   Dg Chest Port 1 View  Result Date: 06/14/2016 CLINICAL DATA:  Chest pain and shortness of breath EXAM: PORTABLE CHEST 1 VIEW  COMPARISON:  Chest radiograph 06/07/2016 FINDINGS: Cardiomediastinal silhouette is mildly enlarged. There are bilateral peribronchial opacities, greatest near the right hilum. No pleural effusion or pneumothorax. IMPRESSION: Mild cardiomegaly and mild pulmonary edema. Electronically Signed   By: Ulyses Jarred M.D.   On: 06/14/2016 00:08   Mm Screening Breast Tomo Bilateral  Result Date: 06/16/2016 CLINICAL DATA:  Screening. EXAM: 2D DIGITAL SCREENING BILATERAL MAMMOGRAM WITH CAD AND ADJUNCT TOMO COMPARISON:  Previous exam(s). ACR Breast Density Category b: There are scattered areas of fibroglandular density. FINDINGS: There are no findings suspicious for malignancy. Images were processed with CAD. IMPRESSION: No mammographic evidence of malignancy. A result letter of this screening mammogram will be mailed directly to the patient. RECOMMENDATION: Screening mammogram in one year. (Code:SM-B-01Y) BI-RADS CATEGORY  1: Negative. Electronically Signed   By: Lajean Manes M.D.   On: 06/16/2016 14:05   Disposition   Pt is being discharged home today in good condition.  Follow-up Plans & Appointments    Follow-up Information    Will Meredith Leeds, MD Follow up on 07/08/2016.   Specialty:  Cardiology Why:  2:00PM, wound check/nursing visit only. Dr. Macky Lower nurse will call you for further follow-up information on your TEE/cardioversion for Thursday. Contact information: [address] [place] [address] [phone]          Discharge Instructions    Diet - low sodium heart healthy    Complete by:  As directed    IMPORTANT: You may restart your Eliquis  TOMORROW NIGHT.   Several of your medications have been adjusted. Please pay attention to the above changes in your heart rate and fluid medications. Your labetalol was stopped. You were given a 2-week prescription of colchicine as well.  Keep procedure site clean & dry. If you notice increased pain, swelling, bleeding or pus,  call/return! Please see attached sheet at the end of your After-Visit Summary for instructions on wound care, activity, and bathing.      Discharge Medications   Allergies as of 06/28/2016      Reactions   Penicillins Hives   Has patient had a PCN reaction causing immediate rash, facial/tongue/throat swelling, SOB or lightheadedness with hypotension: Yes Has patient had a PCN reaction causing severe rash involving mucus membranes or skin necrosis: No Has patient had a PCN reaction that required hospitalization No Has patient had a PCN reaction occurring within the last 10 years: No If all of the above answers are "NO", then may proceed with Cephalosporin use.   Percocet [oxycodone-acetaminophen] Nausea And Vomiting      Medication List    STOP taking these medications   labetalol 200 MG tablet Commonly known as:  NORMODYNE     TAKE these medications   acetaminophen 325 MG tablet Commonly known as:  TYLENOL Take 650 mg by mouth every 6 (six) hours as needed for headache.   apixaban 5 MG Tabs tablet Commonly known as:  ELIQUIS Take 1 tablet (5 mg total) by mouth 2 (two) times daily. Notes to patient:  You may restart this TOMORROW NIGHT (06/29/16).   atorvastatin 80 MG tablet Commonly known as:  LIPITOR Take 1 tablet (80 mg total) by mouth daily at 6 PM. What changed:  when to take this   cholecalciferol 1000 units tablet Commonly known as:  VITAMIN D Take 1,000 Units by mouth at bedtime.   clopidogrel 75 MG tablet Commonly known as:  PLAVIX Take 1 tablet (75 mg total) by mouth daily.   colchicine 0.6 MG tablet Take 1 tablet (0.6 mg total) by mouth daily. Notes to patient:  For 2 weeks   diltiazem 240 MG 24 hr capsule Commonly known as:  CARDIZEM CD Take 1 capsule (240 mg total) by mouth daily. What changed:  medication strength  how much to take   Fish Oil 1200 MG Caps Take 2 capsules (2,400 mg total) by mouth 2 (two) times daily.   furosemide 40 MG  tablet Commonly known as:  LASIX Take 1 tablet (40 mg total) by mouth daily. What changed:  medication strength  when to take this   insulin NPH-regular Human (70-30) 100 UNIT/ML injection Commonly known as:  NOVOLIN 70/30 Inject 30-40 Units into the skin See admin instructions. Inject 30 units before breakfast and 40 units before supper  - only if pre-meal blood glucose is above 90 mg/dL.   levothyroxine 150 MCG tablet Commonly known as:  SYNTHROID, LEVOTHROID Take 150 mcg by mouth daily before breakfast.   losartan 100 MG tablet Commonly known as:  COZAAR TAKE ONE (1) TABLET BY MOUTH EVERY DAY   magnesium oxide 400 MG tablet Commonly known as:  MAG-OX Take 400 mg by mouth daily.   metFORMIN 500 MG tablet Commonly known as:  GLUCOPHAGE Take 500 mg by mouth 2 (two) times daily with a meal.   metoprolol 100 MG tablet Commonly known as:  LOPRESSOR Take 1 tablet (100 mg total) by mouth 2 (two) times daily.   nitroGLYCERIN 0.4 MG SL tablet Commonly known  as:  NITROSTAT Place 1 tablet (0.4 mg total) under the tongue every 5 (five) minutes as needed for chest pain.   potassium chloride SA 20 MEQ tablet Commonly known as:  KLOR-CON M20 Take 1 tablet (20 mEq total) by mouth daily. What changed:  when to take this   rOPINIRole 0.5 MG tablet Commonly known as:  REQUIP Take 0.5 mg by mouth at bedtime.   sodium chloride 0.65 % Soln nasal spray Commonly known as:  OCEAN Place 1 spray into both nostrils as needed for congestion. What changed:  when to take this        Allergies:  Allergies  Allergen Reactions  . Penicillins Hives    Has patient had a PCN reaction causing immediate rash, facial/tongue/throat swelling, SOB or lightheadedness with hypotension: Yes Has patient had a PCN reaction causing severe rash involving mucus membranes or skin necrosis: No Has patient had a PCN reaction that required hospitalization No Has patient had a PCN reaction occurring within  the last 10 years: No If all of the above answers are "NO", then may proceed with Cephalosporin use.  [name] Percocet [Oxycodone-Acetaminophen] Nausea And Vomiting      Outstanding Labs/Studies   See above  Duration of Discharge Encounter   Greater than 30 minutes including physician time.  Signed, Nedra Hai Dunn PA-C 06/28/2016, 10:20 AM  I have seen and examined this patient with Melina Copa.  Agree with above, note added to reflect my findings.  On exam, irregular, tachycardic, no murmurs, lungs clear. Had dual chamber pacemaker placed 1/4 complicated by pericardial effusion and tamponade requiring pericardiocentesis. Drain pulled the next day with stable echos over the course of the hospital stay. Went into atrial fibrillation/flutter on 1/5. Rate control with metoprolol and diltiazem. Plan for discharge today with re-initiation of Eliquis on 1/8. Plan for TEE/CV if not in sinus rhythm during the week with initiation of amiodarone.  Will M. Camnitz MD 06/28/2016 5:29 PM

## 2016-06-28 NOTE — Progress Notes (Signed)
SUBJECTIVE: The patient is doing well today.  At this time, she denies chest pain, shortness of breath, or any new concerns. Sitting up in a chair this AM with out major complaint. Has been up and walking around her room.  [name] atorvastatin  80 mg Oral QPC supper  . cholecalciferol  1,000 Units Oral QHS  . clopidogrel  75 mg Oral Daily  . colchicine  0.6 mg Oral BID  . diltiazem  240 mg Oral QPC supper  . furosemide  40 mg Oral Daily  . insulin aspart protamine- aspart  40 Units Subcutaneous Q supper   And  . insulin aspart protamine- aspart  30 Units Subcutaneous Q breakfast  . levothyroxine  150 mcg Oral QAC breakfast  . losartan  100 mg Oral Daily  . magnesium oxide  400 mg Oral Daily  . mouth rinse  15 mL Mouth Rinse BID  . metFORMIN  500 mg Oral BID WC  . metoprolol tartrate  100 mg Oral BID  . potassium chloride SA  20 mEq Oral QPC supper  . rOPINIRole  0.5 mg Oral QHS   . phenylephrine (NEO-SYNEPHRINE) Adult infusion      OBJECTIVE: Physical Exam: Vitals:   06/28/16 0003 06/28/16 0400 06/28/16 0500 06/28/16 0700  BP: (!) 106/48  116/73   Pulse: 73 100 (!) 110   Resp: (!) 26 (!) 26 (!) 22   Temp: 98.6 F (37 C)  98.7 F (37.1 C) 98.6 F (37 C)  TempSrc: Oral  Oral   SpO2: 94% 97% 96%   Weight:      Height:        Intake/Output Summary (Last 24 hours) at 06/28/16 0834 Last data filed at 06/28/16 3291  Gross per 24 hour  Intake              720 ml  Output              950 ml  Net             -230 ml    Telemetry is reviewed by myself is A paced, V sensed  GEN- The patient is well appearing, alert and oriented x 3 today.   Head- normocephalic, atraumatic Eyes-  Sclera clear, conjunctiva pink Ears- hearing intact Oropharynx- clear Neck- supple, no JVP Lungs- Clear to ausculation bilaterally, normal work of breathing Heart- iRRR, no significant murmurs, no rubs or gallops GI- soft, NT, ND Extremities- no clubbing, cyanosis, or edema Skin- no rash or  lesion Psych- euthymic mood, full affect Neuro- no gross deficits appreciated  PPM implant site is stable, dry, no hematoma Drain site is dry, stable  LABS: Basic Metabolic Panel: No results for input(s): NA, K, CL, CO2, GLUCOSE, BUN, CREATININE, CALCIUM, MG, PHOS in the last 72 hours. CBC:  Recent Labs  06/26/16 0204 06/28/16 0246  WBC 10.5 9.2  HGB 9.5* 8.6*  HCT 29.1* 26.1*  MCV 90.7 90.6  PLT 212 208    RADIOLOGY: Dg Chest 2 View Result Date: 06/26/2016 CLINICAL DATA:  Pacemaker placement EXAM: CHEST  2 VIEW COMPARISON:  06/13/2016 chest radiograph. FINDINGS: Interval placement of 2 lead left subclavian pacemaker with lead tips overlying the right atrium and right ventricular apex. Inferior approach catheter terminates over the left mediastinum. Stable cardiomediastinal silhouette with mild cardiomegaly. No pneumothorax. Trace bilateral pleural effusions. No pulmonary edema. Mild bibasilar atelectasis. IMPRESSION: 1. Well-positioned 2 lead left subclavian pacemaker. No pneumothorax. 2. Mild cardiomegaly without pulmonary edema. 3. Trace  bilateral pleural effusions with mild bibasilar atelectasis. Electronically Signed   By: Ilona Sorrel M.D.   On: 06/26/2016 07:34      ASSESSMENT AND PLAN:   1. Tachy-brady syndrome      S/p PPM implant Thursday      Lead perforation w/tamponade      S/p pericardial tap/drain post procedure      No reaccumulation of fluid on repeat TTE yesterday  2. Paroxysmal AFib/flutter     Will restart Eliquis Monday evening with plans for TEE/CV on Thursday if she remains in atrial fibrillation.     Holding amiodarone today as she is not anticoagulated, will need to be started if no clot on TEE. Will adjust rate control.      Increased metoprolol to 100mg  BID and diltiazem to 240 mg daily for rate control  3. HTN     BP stable  4. CAD     PCI w/DES  to distal RCA Feb 2017     PCI w/DESto mid LCx Oct 2016     PCI to 1st OM PTCA/angiosculpt June  2016     On Plavix  5. Pericardial tamponade:     S/p drainage with 610cc bloody fluid removed over 24 hours, TTE remained stable yesterday.  Continue colchicine 0.6mg  daily for the next 2 weeks. BP stable since drain removed.  Will M. Camnitz MD 06/28/2016 8:34 AM

## 2016-06-29 ENCOUNTER — Telehealth: Payer: Self-pay | Admitting: Cardiology

## 2016-06-29 NOTE — Telephone Encounter (Signed)
Called the patient date follow-up from her hospital course. She says that she is feeling well with just a little bit of fatigue. She says that her heart rates have been in the 60s over the last few times that she is checked since she is been out of the hospital. I have told her to continue to hold her Eliquis as it appears that she has gone back into sinus rhythm. I Lorry Anastasi have her get an EKG checked at her husband's cardiologist office on Wednesday to confirm. I told her to restart the Eliquis on Thursday, January 11.  Kace Hartje Curt Bears, MD 06/29/2016  3:42 PM

## 2016-06-30 NOTE — Telephone Encounter (Signed)
Spoke with [place] office to arrange for EKG while she is there w/ her husband tomorrow (he sees [place]).  The office agreed to perform EKG and results will be available through [facility]. Pt made aware.  She thanks me for helping.

## 2016-07-01 ENCOUNTER — Ambulatory Visit (INDEPENDENT_AMBULATORY_CARE_PROVIDER_SITE_OTHER): Payer: PPO

## 2016-07-01 DIAGNOSIS — I480 Paroxysmal atrial fibrillation: Secondary | ICD-10-CM | POA: Diagnosis not present

## 2016-07-01 DIAGNOSIS — I4891 Unspecified atrial fibrillation: Secondary | ICD-10-CM | POA: Diagnosis not present

## 2016-07-01 NOTE — Progress Notes (Signed)
Pt came in today to have an EKG to see if she was still in a-fib. Will scan EKG in pt's chart for Dr. Curt Bears to see. Pt will wait for a phone call from [place] for further instructions.

## 2016-07-01 NOTE — Patient Instructions (Signed)
Medication Instructions:  Your physician recommends that you continue on your current medications as directed. Please refer to the Current Medication list given to you today.   Labwork: none  Testing/Procedures: none  Follow-Up: Your physician recommends that you schedule a follow-up appointment in: to be determned   Any Other Special Instructions Will Be Listed Below (If Applicable). I will fax a copy of EKG to Dr. Shonna Chock as well as scan into chart.     If you need a refill on your cardiac medications before your next appointment, please call your pharmacy.

## 2016-07-02 ENCOUNTER — Telehealth: Payer: Self-pay | Admitting: Cardiology

## 2016-07-02 ENCOUNTER — Ambulatory Visit: Payer: PPO | Admitting: Gastroenterology

## 2016-07-02 ENCOUNTER — Other Ambulatory Visit: Payer: Self-pay | Admitting: Cardiology

## 2016-07-02 DIAGNOSIS — I4892 Unspecified atrial flutter: Secondary | ICD-10-CM

## 2016-07-02 NOTE — Telephone Encounter (Signed)
New Message    Pt requesting a call back from nurse. Has some questions about visit.

## 2016-07-02 NOTE — Telephone Encounter (Signed)
Instructed patient, per Dr. Curt Bears, that we need to schedule DCCV secondary to AFlutter on EKG yesterday. Pt is agreeable and understands I will call her back once scheduled and we will review instructions.

## 2016-07-03 NOTE — Telephone Encounter (Signed)
TEE/DCCV scheduled for 07/06/16. Pt aware:  NPO after MN & hold Lasix, Metformin and Insulin the morning of procedure. She is aware we will start Amiodarone after DCCV. She and I agreed to speak next week after procedure.

## 2016-07-06 ENCOUNTER — Encounter (HOSPITAL_COMMUNITY): Payer: Self-pay | Admitting: *Deleted

## 2016-07-06 ENCOUNTER — Ambulatory Visit (HOSPITAL_COMMUNITY): Payer: PPO

## 2016-07-06 ENCOUNTER — Encounter (HOSPITAL_COMMUNITY)
Admission: RE | Disposition: A | Discharge status: Home / Self Care | Payer: Self-pay | Source: Ambulatory Visit | Attending: Cardiovascular Disease

## 2016-07-06 ENCOUNTER — Ambulatory Visit (HOSPITAL_COMMUNITY)
Admission: RE | Admit: 2016-07-06 | Discharge: 2016-07-06 | Disposition: A | Discharge status: Home / Self Care | Payer: PPO | Source: Ambulatory Visit | Attending: Cardiovascular Disease | Admitting: Cardiovascular Disease

## 2016-07-06 ENCOUNTER — Encounter (HOSPITAL_COMMUNITY): Payer: Self-pay | Admitting: Anesthesiology

## 2016-07-06 DIAGNOSIS — I4892 Unspecified atrial flutter: Secondary | ICD-10-CM

## 2016-07-06 DIAGNOSIS — Z539 Procedure and treatment not carried out, unspecified reason: Secondary | ICD-10-CM | POA: Insufficient documentation

## 2016-07-06 SURGERY — CANCELLED PROCEDURE

## 2016-07-06 MED ORDER — AMIODARONE HCL 200 MG PO TABS: ORAL_TABLET | ORAL | 0 refills | Status: DC

## 2016-07-06 MED ORDER — SODIUM CHLORIDE 0.9 % IV SOLN: INTRAVENOUS | Status: DC

## 2016-07-06 MED ORDER — AMIODARONE HCL 200 MG PO TABS: 200.0000 mg | ORAL_TABLET | Freq: Every day | ORAL | 6 refills | Status: DC

## 2016-07-06 NOTE — Telephone Encounter (Signed)
Instructed to begin Amiodarone, per Dr. Curt Bears. Reviewed instructions w/ patient. Instructions: - take 400 mg BID x 2 weeks - take 400 mg once daily x 2 weeks - take 200 mg once daily Rx sent to [facility] per pt request. Patient verbalized understanding and agreeable to plan.

## 2016-07-06 NOTE — Anesthesia Preprocedure Evaluation (Deleted)
Anesthesia Evaluation  Patient identified by MRN, date of birth, ID band Patient awake    Reviewed: Allergy & Precautions, H&P , NPO status , Patient's Chart, lab work & pertinent test results, reviewed documented beta blocker date and time   Airway        Dental no notable dental hx.    Pulmonary sleep apnea ,    Pulmonary exam normal        Cardiovascular hypertension, Pt. on medications and Pt. on home beta blockers + CAD, + Past MI, + Cardiac Stents, + Peripheral Vascular Disease and +CHF  + dysrhythmias Atrial Fibrillation      Neuro/Psych TIAnegative psych ROS   GI/Hepatic Neg liver ROS, GERD  Medicated and Controlled,  Endo/Other  diabetes, Oral Hypoglycemic Agents, Insulin DependentHypothyroidism   Renal/GU negative Renal ROS  negative genitourinary   Musculoskeletal  (+) Arthritis , Osteoarthritis,    Abdominal   Peds  Hematology  (+) anemia ,   Anesthesia Other Findings   Reproductive/Obstetrics negative OB ROS                             Anesthesia Physical Anesthesia Plan  ASA: III  Anesthesia Plan: General   Post-op Pain Management:    Induction: Intravenous  Airway Management Planned: Nasal Cannula  Additional Equipment:   Intra-op Plan:   Post-operative Plan:   Informed Consent: I have reviewed the patients History and Physical, chart, labs and discussed the procedure including the risks, benefits and alternatives for the proposed anesthesia with the patient or authorized representative who has indicated his/her understanding and acceptance.   Dental advisory given  Plan Discussed with: CRNA  Anesthesia Plan Comments:         Anesthesia Quick Evaluation

## 2016-07-06 NOTE — Addendum Note (Signed)
Addended by: Stanton Kidney on: 07/06/2016 05:06 PM   Modules accepted: Orders

## 2016-07-06 NOTE — Interval H&P Note (Signed)
History and Physical Interval Note:  07/06/2016 8:54 AM  Virginia Huber  has presented today for surgery, with the diagnosis of AFIB  The various methods of treatment have been discussed with the patient and family. After consideration of risks, benefits and other options for treatment, the patient has consented to  Procedure(s): TRANSESOPHAGEAL ECHOCARDIOGRAM (TEE) (N/A) CARDIOVERSION (N/A) as a surgical intervention .  The patient's history has been reviewed, patient examined, no change in status, stable for surgery.  I have reviewed the patient's chart and labs.  Questions were answered to the patient's satisfaction.     Jenkins Rouge

## 2016-07-06 NOTE — Progress Notes (Signed)
Patient presented for outpatient TEE/Cardioversion today. Patient A-paced in sinus rhythm on monitor. [employer] rep here to interrogate confirms same. Procedure cancelled per Dr. Johnsie Cancel. Dr. Johnsie Cancel discussed this with patient. She expresses understanding and agrees.

## 2016-07-06 NOTE — H&P (View-Only) (Signed)
SUBJECTIVE: The patient is doing well today.  At this time, she denies chest pain, shortness of breath, or any new concerns. Sitting up in a chair this AM with out major complaint. Has been up and walking around her room.  [name] atorvastatin  80 mg Oral QPC supper  . cholecalciferol  1,000 Units Oral QHS  . clopidogrel  75 mg Oral Daily  . colchicine  0.6 mg Oral BID  . diltiazem  240 mg Oral QPC supper  . furosemide  40 mg Oral Daily  . insulin aspart protamine- aspart  40 Units Subcutaneous Q supper   And  . insulin aspart protamine- aspart  30 Units Subcutaneous Q breakfast  . levothyroxine  150 mcg Oral QAC breakfast  . losartan  100 mg Oral Daily  . magnesium oxide  400 mg Oral Daily  . mouth rinse  15 mL Mouth Rinse BID  . metFORMIN  500 mg Oral BID WC  . metoprolol tartrate  100 mg Oral BID  . potassium chloride SA  20 mEq Oral QPC supper  . rOPINIRole  0.5 mg Oral QHS   . phenylephrine (NEO-SYNEPHRINE) Adult infusion      OBJECTIVE: Physical Exam: Vitals:   06/28/16 0003 06/28/16 0400 06/28/16 0500 06/28/16 0700  BP: (!) 106/48  116/73   Pulse: 73 100 (!) 110   Resp: (!) 26 (!) 26 (!) 22   Temp: 98.6 F (37 C)  98.7 F (37.1 C) 98.6 F (37 C)  TempSrc: Oral  Oral   SpO2: 94% 97% 96%   Weight:      Height:        Intake/Output Summary (Last 24 hours) at 06/28/16 0834 Last data filed at 06/28/16 4431  Gross per 24 hour  Intake              720 ml  Output              950 ml  Net             -230 ml    Telemetry is reviewed by myself is A paced, V sensed  GEN- The patient is well appearing, alert and oriented x 3 today.   Head- normocephalic, atraumatic Eyes-  Sclera clear, conjunctiva pink Ears- hearing intact Oropharynx- clear Neck- supple, no JVP Lungs- Clear to ausculation bilaterally, normal work of breathing Heart- iRRR, no significant murmurs, no rubs or gallops GI- soft, NT, ND Extremities- no clubbing, cyanosis, or edema Skin- no rash or  lesion Psych- euthymic mood, full affect Neuro- no gross deficits appreciated  PPM implant site is stable, dry, no hematoma Drain site is dry, stable  LABS: Basic Metabolic Panel: No results for input(s): NA, K, CL, CO2, GLUCOSE, BUN, CREATININE, CALCIUM, MG, PHOS in the last 72 hours. CBC:  Recent Labs  06/26/16 0204 06/28/16 0246  WBC 10.5 9.2  HGB 9.5* 8.6*  HCT 29.1* 26.1*  MCV 90.7 90.6  PLT 212 208    RADIOLOGY: Dg Chest 2 View Result Date: 06/26/2016 CLINICAL DATA:  Pacemaker placement EXAM: CHEST  2 VIEW COMPARISON:  06/13/2016 chest radiograph. FINDINGS: Interval placement of 2 lead left subclavian pacemaker with lead tips overlying the right atrium and right ventricular apex. Inferior approach catheter terminates over the left mediastinum. Stable cardiomediastinal silhouette with mild cardiomegaly. No pneumothorax. Trace bilateral pleural effusions. No pulmonary edema. Mild bibasilar atelectasis. IMPRESSION: 1. Well-positioned 2 lead left subclavian pacemaker. No pneumothorax. 2. Mild cardiomegaly without pulmonary edema. 3. Trace  bilateral pleural effusions with mild bibasilar atelectasis. Electronically Signed   By: Ilona Sorrel M.D.   On: 06/26/2016 07:34      ASSESSMENT AND PLAN:   1. Tachy-brady syndrome      S/p PPM implant Thursday      Lead perforation w/tamponade      S/p pericardial tap/drain post procedure      No reaccumulation of fluid on repeat TTE yesterday  2. Paroxysmal AFib/flutter     Michaeljohn Biss restart Eliquis Monday evening with plans for TEE/CV on Thursday if she remains in atrial fibrillation.     Holding amiodarone today as she is not anticoagulated, Georg Ang need to be started if no clot on TEE. Dearies Meikle adjust rate control.      Increased metoprolol to 100mg  BID and diltiazem to 240 mg daily for rate control  3. HTN     BP stable  4. CAD     PCI w/DES  to distal RCA Feb 2017     PCI w/DESto mid LCx Oct 2016     PCI to 1st OM PTCA/angiosculpt June  2016     On Plavix  5. Pericardial tamponade:     S/p drainage with 610cc bloody fluid removed over 24 hours, TTE remained stable yesterday.  Continue colchicine 0.6mg  daily for the next 2 weeks. BP stable since drain removed.  Ekaterini Capitano M. Cheryl Chay MD 06/28/2016 8:34 AM

## 2016-07-07 MED FILL — Dopamine in Dextrose 5% Inj 3.2 MG/ML: INTRAVENOUS | Qty: 250 | Status: AC

## 2016-07-08 ENCOUNTER — Ambulatory Visit: Payer: PPO

## 2016-07-20 DIAGNOSIS — G4733 Obstructive sleep apnea (adult) (pediatric): Secondary | ICD-10-CM | POA: Diagnosis not present

## 2016-07-21 ENCOUNTER — Other Ambulatory Visit: Payer: Self-pay | Admitting: "Endocrinology

## 2016-07-21 DIAGNOSIS — E1159 Type 2 diabetes mellitus with other circulatory complications: Secondary | ICD-10-CM | POA: Diagnosis not present

## 2016-07-21 LAB — COMPREHENSIVE METABOLIC PANEL
ALBUMIN: 3.6 g/dL (ref 3.6–5.1)
ALT: 8 U/L (ref 6–29)
AST: 9 U/L — ABNORMAL LOW (ref 10–35)
Alkaline Phosphatase: 77 U/L (ref 33–130)
BUN: 23 mg/dL (ref 7–25)
CALCIUM: 9 mg/dL (ref 8.6–10.4)
CHLORIDE: 106 mmol/L (ref 98–110)
CO2: 25 mmol/L (ref 20–31)
CREATININE: 1.31 mg/dL — AB (ref 0.60–0.93)
Glucose, Bld: 104 mg/dL — ABNORMAL HIGH (ref 65–99)
POTASSIUM: 4.5 mmol/L (ref 3.5–5.3)
SODIUM: 141 mmol/L (ref 135–146)
TOTAL PROTEIN: 5.8 g/dL — AB (ref 6.1–8.1)
Total Bilirubin: 0.5 mg/dL (ref 0.2–1.2)

## 2016-07-22 ENCOUNTER — Ambulatory Visit (INDEPENDENT_AMBULATORY_CARE_PROVIDER_SITE_OTHER): Payer: PPO | Admitting: *Deleted

## 2016-07-22 DIAGNOSIS — I480 Paroxysmal atrial fibrillation: Secondary | ICD-10-CM

## 2016-07-22 DIAGNOSIS — Z950 Presence of cardiac pacemaker: Secondary | ICD-10-CM | POA: Diagnosis not present

## 2016-07-22 DIAGNOSIS — I495 Sick sinus syndrome: Secondary | ICD-10-CM

## 2016-07-22 LAB — CUP PACEART INCLINIC DEVICE CHECK
Brady Statistic AP VS Percent: 82.73 %
Brady Statistic AS VP Percent: 1.75 %
Brady Statistic AS VS Percent: 15.37 %
Brady Statistic RA Percent Paced: 77.96 %
Implantable Lead Implant Date: 20180104
Implantable Lead Location: 753859
Lead Channel Impedance Value: 361 Ohm
Lead Channel Impedance Value: 494 Ohm
Lead Channel Pacing Threshold Amplitude: 0.75 V
Lead Channel Pacing Threshold Amplitude: 1.25 V
Lead Channel Pacing Threshold Pulse Width: 0.4 ms
Lead Channel Setting Pacing Amplitude: 3.5 V
Lead Channel Setting Pacing Pulse Width: 0.4 ms
Lead Channel Setting Sensing Sensitivity: 2.8 mV
MDC IDC LEAD IMPLANT DT: 20180104
MDC IDC LEAD LOCATION: 753860
MDC IDC MSMT BATTERY REMAINING LONGEVITY: 132 mo
MDC IDC MSMT BATTERY VOLTAGE: 3.08 V
MDC IDC MSMT LEADCHNL RA IMPEDANCE VALUE: 380 Ohm
MDC IDC MSMT LEADCHNL RA PACING THRESHOLD PULSEWIDTH: 0.4 ms
MDC IDC MSMT LEADCHNL RA SENSING INTR AMPL: 1.375 mV
MDC IDC MSMT LEADCHNL RV IMPEDANCE VALUE: 475 Ohm
MDC IDC MSMT LEADCHNL RV SENSING INTR AMPL: 12.375 mV
MDC IDC PG IMPLANT DT: 20180104
MDC IDC SESS DTM: 20180131155427
MDC IDC SET LEADCHNL RA PACING AMPLITUDE: 3.5 V
MDC IDC STAT BRADY AP VP PERCENT: 0.14 %
MDC IDC STAT BRADY RV PERCENT PACED: 1.77 %

## 2016-07-22 LAB — HEMOGLOBIN A1C
HEMOGLOBIN A1C: 6.3 % — AB (ref <5.7)
MEAN PLASMA GLUCOSE: 134 mg/dL

## 2016-07-22 NOTE — Patient Instructions (Addendum)
Medication Instructions:  - Decrease your amiodarone to 200mg  (1 tablet) daily.  - Increase your furosemide (Lasix) to 40mg  twice daily for 3 days.  After 3 days, reduce your dose back to 40mg  once daily.  Labwork: None ordered  Testing/Procedures: None ordered  Follow-Up: Call Dr. Court Joy office if your shortness of breath does not improve.  Continue to weigh yourself daily.    Any Other Special Instructions Will Be Listed Below (If Applicable).     If you need a refill on your cardiac medications before your next appointment, please call your pharmacy.

## 2016-07-22 NOTE — Progress Notes (Signed)
Wound check appointment. Steri-strips removed. Wound without redness or edema. Incision edges approximated, wound well healed. Normal device function. Thresholds, sensing, and impedances consistent with implant measurements. Device programmed at 3.5V with auto capture programmed on for extra safety margin until 3 month visit. Histogram distribution appropriate for patient and level of activity. 2 AT/AF episodes (14.5%), AF +Eliquis and amiodarone, longest 29hrs, no episodes since 07/11/16. No high ventricular rates noted. Patient educated about wound care, arm mobility, lifting restrictions. ROV with WC on 09/25/16.  Patient reports increased dyspnea--particularly with exertion--since PPM placed on 06/25/16. She also notes that she has been "wide awake all night," but reports it is uncomfortable to lie down to sleep due to ShOB. She denies chest discomfort, dyspnea sitting up or at rest, dizziness, palpitations, or other symptoms. Last week, she noted weight gain of ~5lbs and increased her furosemide to 80mg  daily "for a few days". This helped and her weight is now only up by ~2lbs. She has since returned to 40mg  dose of furosemide.  Discussed with Dr. Rayann Heman, who recommended decreasing amiodarone to 200mg  daily and increasing furosemide to 40mg  BID x3 days. Patient and husband instructed to contact Dr. Court Joy office for further instructions if dyspnea is not improved by Friday. Patient instructed to seek emergency medical attention at the ED if her symptoms are acutely worsening overnight or on the weekend. Patient and husband verbalize understanding of instructions and deny additional questions or concerns at this time.

## 2016-07-27 DIAGNOSIS — E113391 Type 2 diabetes mellitus with moderate nonproliferative diabetic retinopathy without macular edema, right eye: Secondary | ICD-10-CM | POA: Diagnosis not present

## 2016-07-27 DIAGNOSIS — H35033 Hypertensive retinopathy, bilateral: Secondary | ICD-10-CM | POA: Diagnosis not present

## 2016-07-27 DIAGNOSIS — H43811 Vitreous degeneration, right eye: Secondary | ICD-10-CM | POA: Diagnosis not present

## 2016-07-27 DIAGNOSIS — H348322 Tributary (branch) retinal vein occlusion, left eye, stable: Secondary | ICD-10-CM | POA: Diagnosis not present

## 2016-07-28 ENCOUNTER — Ambulatory Visit (INDEPENDENT_AMBULATORY_CARE_PROVIDER_SITE_OTHER): Payer: PPO | Admitting: "Endocrinology

## 2016-07-28 ENCOUNTER — Encounter: Payer: Self-pay | Admitting: "Endocrinology

## 2016-07-28 ENCOUNTER — Encounter: Payer: PPO | Attending: Family Medicine | Admitting: Nutrition

## 2016-07-28 VITALS — BP 131/73 | HR 76 | Ht 63.0 in | Wt 146.0 lb

## 2016-07-28 VITALS — Wt 146.0 lb

## 2016-07-28 DIAGNOSIS — E038 Other specified hypothyroidism: Secondary | ICD-10-CM | POA: Diagnosis not present

## 2016-07-28 DIAGNOSIS — I10 Essential (primary) hypertension: Secondary | ICD-10-CM

## 2016-07-28 DIAGNOSIS — Z794 Long term (current) use of insulin: Secondary | ICD-10-CM

## 2016-07-28 DIAGNOSIS — E782 Mixed hyperlipidemia: Secondary | ICD-10-CM

## 2016-07-28 DIAGNOSIS — Z713 Dietary counseling and surveillance: Secondary | ICD-10-CM | POA: Diagnosis not present

## 2016-07-28 DIAGNOSIS — E118 Type 2 diabetes mellitus with unspecified complications: Secondary | ICD-10-CM

## 2016-07-28 DIAGNOSIS — E1159 Type 2 diabetes mellitus with other circulatory complications: Secondary | ICD-10-CM | POA: Insufficient documentation

## 2016-07-28 DIAGNOSIS — IMO0002 Reserved for concepts with insufficient information to code with codable children: Secondary | ICD-10-CM

## 2016-07-28 DIAGNOSIS — E1165 Type 2 diabetes mellitus with hyperglycemia: Secondary | ICD-10-CM

## 2016-07-28 NOTE — Progress Notes (Signed)
  Diabetes Self-Management Education  Visit Type:   Follow up  Appt. Start Time: 1030 Appt. End Time: 1100 07/28/2016 :   [name], identified by name and date of birth, is a 71 y.o. female with a diagnosis of Diabetes:  .  Had a pacemaker revised this past month and couldn't eat like she had been.  Eating three meals per day. Not able to exercise for 2 more week.due to pacemaker issues and AFib. Lost about 6 lbs since last visit.  FBS: 100's. A1C 6.3% down from 7.7%. Saw Dr. Dorris Fetch today. Reduced 70/30 to 30 BID instead of 30 units in am and 40  Units at night.  Had been having some low blood sugars. Diet is doing very well. Will start exercising again when released by MD for her heart.    Lab Results  Component Value Date   HGBA1C 7.7 (H) 04/14/2016     B) Scrambled egg, toast and fruit, water L) Soup and sandwich, veggies,  water  D) Grilled shrimp, coleslaw and hush puppies, water, potatoes, pineapple  Drinks water only   Learning Objective:  Patient will have a greater understanding of diabetes self-management. Patient education plan is to attend individual and/or group sessions per assessed needs and concerns.   Plan:  Goals 1. Maintain weight 2 Stick to Plate Method-- eat 2-3 carb choices per meal. 3. Avoid snacks unless having a low blood sugar 4.Exdercise when allowed. 5. Make sure protein with all meals 30 units of 70/30 with breakfast and dinner. Call Dr. Dorris Fetch if BS are in the 70's or less 2-3 times in a row.   Keep up the great job!!!   Expected Outcomes:   Increased education and improved blood sugars and reduced complications.   Education material provided: Meal plan card and My Plate  If problems or questions, patient to contact team via:  Phone and Email  Future DSME appointment: -   3 months.

## 2016-07-28 NOTE — Patient Instructions (Signed)
Goals 1. Maintain weight 2 Stick to Plate Method-- eat 2-3 carb choices per meal. 3. Avoid snacks unless having a low blood sugar 4.Exdercise when allowed. 5. Make sure protein with all meals 30 units of 70/30 with breakfast and dinner. Call Dr. Dorris Fetch if BS are in the 70's or less 2-3 times in a row.   Keep up the great job!!!

## 2016-07-28 NOTE — Progress Notes (Signed)
Subjective:    Patient ID: Virginia Huber, female    DOB: 15-Feb-1946. Patient is being seen in f/u for management of diabetes requested by  Purvis Kilts, MD  Past Medical History:  Diagnosis Date  . Arthritis   . Atrial fibrillation and flutter ([place])    a. h/o PAF/flutter during admission in 2013 for PNA. b. PAF during adm for NSTEMI 07/2015, subsequent paroxysms since then.  . B12 deficiency anemia   . Cardiac tamponade 06/2016  . Coronary artery disease 11/30/2014   a. remote MI. b. h/o PTCA with scoring balloon to OM1 11/2014. c. NSTEMI 03/2015 s/p DES to prox-mid Cx. d. NSTEMI 07/2015 s/p scoring balloon/PTCA/DES to dRCA with PAF during that admission  . Cutaneous lupus erythematosus   . GERD (gastroesophageal reflux disease)   . Heart block   . History of blood transfusion 1980's   2nd surgical procedures  . HTN (hypertension)   . Hypercholesteremia   . Hypothyroidism   . Myocardial infarction 02/2012  . Ovarian tumor   . PAD (peripheral artery disease) ([place])    a. s/p LE angio 2015; followed by Dr. Fletcher Anon - managed medically.  . Pain with urination 05/08/2015  . Paroxysmal atrial flutter ([place])   . Pericardial effusion    a. 06/2016 after ppm - s/p pericardiocentesis.  . Superficial fungus infection of skin 06/29/2013  . Tachy-brady syndrome ([place])    a. s/p Medtronic PPM 06/2016, c/b lead perf/pericardial effusion.  [name] TIA (transient ischemic attack) 08/2001; ~ 2006  . Type II diabetes mellitus ([place])   . UTI (urinary tract infection) 05/08/2013   Past Surgical History:  Procedure Laterality Date  . ABDOMINAL AORTAGRAM N/A 01/03/2014   Procedure: ABDOMINAL [name];  Surgeon: Wellington Hampshire, MD;  Location: [facility];  Service: Cardiovascular;  Laterality: N/A;  . ABDOMINAL HYSTERECTOMY  1972   "partial"  . APPENDECTOMY  1970's  . CARDIAC CATHETERIZATION  2008   Tiny OM-2 with 90% narrowing. Med tx.  [name] CARDIAC CATHETERIZATION N/A 11/30/2014   Procedure: Left  Heart Cath and Coronary Angiography;  Surgeon: Troy Sine, MD; LAD 20%, CFX 50%, OM1 95%, right PLB 30%, LV normal   . CARDIAC CATHETERIZATION N/A 11/30/2014   Procedure: Coronary Balloon Angioplasty;  Surgeon: Troy Sine, MD;  Angiosculpt scoring balloon and PTCA to the OM1 reducing stenosis from 95% to less than 10%  . CARDIAC CATHETERIZATION N/A 04/03/2015   Procedure: Left Heart Cath and Coronary Angiography;  Surgeon: Jolaine Artist, MD; dLAD 50%, CFX 90%, OM1 100%, PLA 15%, LVEDP 13    . CARDIAC CATHETERIZATION N/A 04/03/2015   Procedure: Coronary Stent Intervention;  Surgeon: Sherren Mocha, MD; 3.0x18 mm Xience DES to the CFX    . CARDIAC CATHETERIZATION N/A 08/02/2015   Procedure: Left Heart Cath and Coronary Angiography;  Surgeon: Troy Sine, MD;  Location: [facility];  Service: Cardiovascular;  Laterality: N/A;  . CARDIAC CATHETERIZATION N/A 08/02/2015   Procedure: Coronary Stent Intervention;  Surgeon: Troy Sine, MD;  Location: [facility];  Service: Cardiovascular;  Laterality: N/A;  . CARDIAC CATHETERIZATION N/A 06/25/2016   Procedure: Pericardiocentesis;  Surgeon: Will Meredith Leeds, MD;  Location: [facility];  Service: Cardiovascular;  Laterality: N/A;  . cardiac stents    . CHOLECYSTECTOMY OPEN  1990's  . COLONOSCOPY  2005   Dr. Laural Golden: pancolonic divericula, polyp, path unknown currently  . COLONOSCOPY  2012   Dr. Oneida Alar: Normal  TI, scattered diverticula in entire colon, small internal hemorrhoids, normal colon biopsies. Colonoscopy in 5-10 years.   . COLOSTOMY  05/1979  . COLOSTOMY REVERSAL  11/1979  . EP IMPLANTABLE DEVICE N/A 06/25/2016   Procedure: Lead Revision/Repair;  Surgeon: Will Meredith Leeds, MD;  Location: [facility];  Service: Cardiovascular;  Laterality: N/A;  . EP IMPLANTABLE DEVICE N/A 06/25/2016   Procedure: Pacemaker Implant;  Surgeon: Will Meredith Leeds, MD;  Location: [facility];  Service:  Cardiovascular;  Laterality: N/A;  . EXCISIONAL HEMORRHOIDECTOMY  1970's  . EYE SURGERY Left 2000   "branch vein occlusion"  . EYE SURGERY Left ~ 2001   "smoothed out wrinkle"  . LEFT OOPHORECTOMY  05/1979   nicked bowel, peritonitis, colostomy; colostomy reversed 1981   . LOWER EXTREMITY ANGIOGRAM N/A 01/03/2014   Procedure: LOWER EXTREMITY ANGIOGRAM;  Surgeon: Wellington Hampshire, MD;  Location: [facility];  Service: Cardiovascular;  Laterality: N/A;  . Nuclear med stress test  10/2011   Small area of mild ischemia inferoapically.  [name] PARTIAL HYSTERECTOMY  1970's   left ovaries, then ovaries removed later due tumors   . RIGHT OOPHORECTOMY  1970's   Social History   Social History  . Marital status: Married    Spouse name: N/A  . Number of children: N/A  . Years of education: N/A   Occupational History  . Retired Retired    Research officer, political party   Social History Main Topics  . Smoking status: Never Smoker  . Smokeless tobacco: Never Used     Comment: Never smoked  . Alcohol use No  . Drug use: No  . Sexual activity: No     Comment: hyst   Other Topics Concern  . None   Social History Narrative  . None   Outpatient Encounter Prescriptions as of 07/28/2016  Medication Sig  . acetaminophen (TYLENOL) 325 MG tablet Take 650 mg by mouth every 6 (six) hours as needed for headache.  [name] amiodarone (PACERONE) 200 MG tablet Take 1 tablet (200 mg total) by mouth daily.  [name] apixaban (ELIQUIS) 5 MG TABS tablet Take 1 tablet (5 mg total) by mouth 2 (two) times daily.  [name] atorvastatin (LIPITOR) 80 MG tablet Take 1 tablet (80 mg total) by mouth daily at 6 PM.  . cholecalciferol (VITAMIN D) 1000 UNITS tablet Take 1,000 Units by mouth at bedtime.   . clopidogrel (PLAVIX) 75 MG tablet Take 1 tablet (75 mg total) by mouth daily.  [name] diltiazem (CARDIZEM CD) 240 MG 24 hr capsule Take 1 capsule (240 mg total) by mouth daily.  . diphenhydramine-acetaminophen (TYLENOL PM EXTRA STRENGTH) 25-500 MG TABS  tablet Take 1 tablet by mouth at bedtime as needed.  . furosemide (LASIX) 40 MG tablet Take 1 tablet (40 mg total) by mouth daily.  . insulin NPH-regular Human (NOVOLIN 70/30) (70-30) 100 UNIT/ML injection Inject 30 Units into the skin See admin instructions. Inject 30 units before breakfast and 30 units before supper  - only if pre-meal blood glucose is above 90 mg/dL.  [name] levothyroxine (SYNTHROID, LEVOTHROID) 150 MCG tablet Take 150 mcg by mouth daily before breakfast.  . losartan (COZAAR) 100 MG tablet TAKE ONE (1) TABLET BY MOUTH EVERY DAY  . magnesium oxide (MAG-OX) 400 MG tablet Take 400 mg by mouth daily.  . metFORMIN (GLUCOPHAGE) 500 MG tablet Take 500 mg by mouth 2 (two) times daily with a meal.  . metoprolol (LOPRESSOR) 100 MG tablet Take 1 tablet (100 mg  total) by mouth 2 (two) times daily.  . nitroGLYCERIN (NITROSTAT) 0.4 MG SL tablet Place 1 tablet (0.4 mg total) under the tongue every 5 (five) minutes as needed for chest pain.  . Omega-3 Fatty Acids (FISH OIL) 1200 MG CAPS Take 2 capsules (2,400 mg total) by mouth 2 (two) times daily.  . potassium chloride SA (KLOR-CON M20) 20 MEQ tablet Take 1 tablet (20 mEq total) by mouth daily.  [name] rOPINIRole (REQUIP) 0.5 MG tablet Take 0.5 mg by mouth at bedtime.   . sodium chloride (OCEAN) 0.65 % SOLN nasal spray Place 1 spray into both nostrils as needed for congestion. (Patient taking differently: Place 1 spray into both nostrils at bedtime as needed for congestion. )   No facility-administered encounter medications on file as of 07/28/2016.    ALLERGIES: VACCINATION STATUS: Immunization History  Administered Date(s) Administered  . Influenza-Unspecified 03/27/2015  . Pneumococcal-Unspecified 02/21/2012    Diabetes  She presents for her follow-up diabetic visit. She has type 2 diabetes mellitus. Onset time: She was diagnosed at approximate age of 64 years. Her disease course has been improving. There are no hypoglycemic associated symptoms.  Pertinent negatives for hypoglycemia include no confusion, headaches, pallor, seizures or tremors. Associated symptoms include fatigue. Pertinent negatives for diabetes include no chest pain, no polydipsia, no polyphagia and no polyuria. There are no hypoglycemic complications. Symptoms are improving. Diabetic complications include a CVA and heart disease. Risk factors for coronary artery disease include diabetes mellitus, dyslipidemia, hypertension and sedentary lifestyle. Current diabetic treatments: She is on Novolin 70/30 60 units twice a day, metformin 500 mg 3 times a day. Her weight is decreasing steadily. She is following a generally unhealthy diet. When asked about meal planning, she reported none. She has had a previous visit with a dietitian. She participates in exercise intermittently. Home blood sugar record trend: She did not bring the meter to review today. She mentions that she is monitoring randomly and regular.[name] Her breakfast blood glucose range is generally 130-140 mg/dl. Her highest blood glucose is 130-140 mg/dl. Her overall blood glucose range is 130-140 mg/dl. An ACE inhibitor/angiotensin II receptor blocker is being taken. Eye exam is current.  Hyperlipidemia  This is a chronic problem. The current episode started more than 1 year ago. The problem is uncontrolled. Recent lipid tests were reviewed and are high. Exacerbating diseases include diabetes and hypothyroidism. Pertinent negatives include no chest pain, myalgias or shortness of breath. Current antihyperlipidemic treatment includes statins, fibric acid derivatives and bile acid squestrants. The current treatment provides moderate improvement of lipids.  Hypertension  This is a chronic problem. The current episode started more than 1 year ago. The problem is uncontrolled. Pertinent negatives include no chest pain, headaches, palpitations or shortness of breath. Risk factors for coronary artery disease include dyslipidemia, diabetes  mellitus and sedentary lifestyle. Past treatments include angiotensin blockers. Hypertensive end-organ damage includes CAD/MI and CVA.     Review of Systems  Constitutional: Positive for fatigue. Negative for chills, fever and unexpected weight change.  HENT: Negative for trouble swallowing and voice change.   Eyes: Negative for visual disturbance.  Respiratory: Negative for cough, shortness of breath and wheezing.   Cardiovascular: Negative for chest pain, palpitations and leg swelling.       No Shortness of breath  Gastrointestinal: Negative for abdominal pain, diarrhea, nausea and vomiting.  Endocrine: Negative for cold intolerance, heat intolerance, polydipsia, polyphagia and polyuria.  Genitourinary: Negative for frequency, hematuria and urgency.  Musculoskeletal: Negative for arthralgias  and myalgias.  Skin: Negative for color change, pallor, rash and wound.  Neurological: Negative for tremors, seizures and headaches.  Hematological: Does not bruise/bleed easily.  Psychiatric/Behavioral: Negative for confusion, hallucinations and suicidal ideas.    Objective:    BP 131/73   Pulse 76   Ht 5\' 3"  (1.6 m)   Wt 146 lb (66.2 kg)   BMI 25.86 kg/m   Wt Readings from Last 3 Encounters:  07/28/16 146 lb (66.2 kg)  07/28/16 146 lb (66.2 kg)  06/25/16 153 lb 3.5 oz (69.5 kg)    Physical Exam  Constitutional: She is oriented to person, place, and time. She appears well-developed.  HENT:  Head: Normocephalic and atraumatic.  Eyes: EOM are normal.  Neck: Normal range of motion. Neck supple. No tracheal deviation present. No thyromegaly present.  Cardiovascular: Normal rate and regular rhythm.   Pulmonary/Chest: Effort normal and breath sounds normal.  Abdominal: Soft. Bowel sounds are normal. There is no tenderness. There is no guarding.  Musculoskeletal: Normal range of motion. She exhibits no edema.  Neurological: She is alert and oriented to person, place, and time. She has  normal reflexes. No cranial nerve deficit. Coordination normal.  Skin: Skin is warm and dry. No rash noted. No erythema. No pallor.  Psychiatric: She has a normal mood and affect. Judgment normal.    CMP     Component Value Date/Time   NA 141 07/21/2016 0903   NA 140 06/23/2016 1550   K 4.5 07/21/2016 0903   CL 106 07/21/2016 0903   CO2 25 07/21/2016 0903   GLUCOSE 104 (H) 07/21/2016 0903   BUN 23 07/21/2016 0903   BUN 21 06/23/2016 1550   CREATININE 1.31 (H) 07/21/2016 0903   CALCIUM 9.0 07/21/2016 0903   PROT 5.8 (L) 07/21/2016 0903   ALBUMIN 3.6 07/21/2016 0903   AST 9 (L) 07/21/2016 0903   ALT 8 07/21/2016 0903   ALKPHOS 77 07/21/2016 0903   BILITOT 0.5 07/21/2016 0903   GFRNONAA 58 (L) 06/23/2016 1550   GFRAA 66 06/23/2016 1550     Diabetic Labs (most recent): Lab Results  Component Value Date   HGBA1C 6.3 (H) 07/21/2016   HGBA1C 7.2 (H) 06/07/2016   HGBA1C 7.7 (H) 04/14/2016     Lipid Panel ( most recent) Lipid Panel     Component Value Date/Time   CHOL 116 (L) 04/14/2016 0910   TRIG 439 (H) 04/14/2016 0910   HDL 24 (L) 04/14/2016 0910   CHOLHDL 4.8 04/14/2016 0910   VLDL NOT CALC 04/14/2016 0910   LDLCALC NOT CALC 04/14/2016 0910       Assessment & Plan:   1. DM type 2 causing vascular disease ([place]) - Patient has currently uncontrolled symptomatic type 2 DM since  71 years of age. - She came with significant improvement in her glucose profile and her A1c improved to 6.3% from 7.7%.  - I have reviewed her most recent labs.  -Her diabetes is complicated by coronary artery disease which is recurrent requiring stent placement , CVA, and patient remains at a high risk for more acute and chronic complications of diabetes which include CAD, CVA, CKD, retinopathy, and neuropathy. These are all discussed in detail with the patient.  - I have counseled the patient on diet management and weight loss, by adopting a carbohydrate restricted/protein rich diet.  -  Suggestion is made for patient to avoid simple carbohydrates   from their diet including Cakes , Desserts, Ice Cream,  Soda (  diet and regular) , Sweet Tea , Candies,  Chips, Cookies, Artificial Sweeteners,   and "Sugar-free" Products . This will help patient to have stable blood glucose profile and potentially avoid unintended weight gain.  - I encouraged the patient to switch to  unprocessed or minimally processed complex starch and increased protein intake (animal or plant source), fruits, and vegetables.  - Patient is advised to stick to a routine mealtimes to eat 3 meals  a day and avoid unnecessary snacks ( to snack only to correct hypoglycemia).  - The patient will be scheduled with Jearld Fenton, RDN, CDE for individualized DM education.  - I have approached patient with the following individualized plan to manage diabetes and patient agrees:   - Due to cost reasons , and because of the fact that she is responding appropriately, I will keep her on  Novolin 70/30. - I will adjust Novolin 70/30 to 30 units with breakfast and 30 units with supper when pre-meal blood glucose is above 90 mg/dL. - She is advised to start strict monitoring of glucose  AC and HS. - If her blood glucose profile is significantly abnormal, she will be considered for insulin analogs. - Patient is warned not to take insulin without proper monitoring per orders.  -Patient is encouraged to call clinic for blood glucose levels less than 70 or above 300 mg /dl. - I will continue metformin  500 mg by mouth twice a day  ,  Still therapeutically suitable for patient.   - Patient specific target  A1c;  LDL, HDL, Triglycerides, and  Waist Circumference were discussed in detail.  2) BP/HTN: Uncontrolled. Continue current medications including ACEI/ARB. 3) Lipids/HPL:  Uncontrolled with triglycerides of 439, LDL was not calculated. I advised her to continue statins and increase her fish oil to 2 capsules twice a day. 4)   Weight/Diet: CDE Consult has been initiated , exercise, and detailed carbohydrates information provided.   5) hypothyroidism: Long-standing for at least 20 years.  - Her labs are consistent with appropriate replacement. I will continue levothyroxine 150 g by mouth every morning.  - We discussed about correct intake of levothyroxine, at fasting, with water, separated by at least 30 minutes from breakfast, and separated by more than 4 hours from calcium, iron, multivitamins, acid reflux medications (PPIs). -Patient is made aware of the fact that thyroid hormone replacement is needed for life, dose to be adjusted by periodic monitoring of thyroid function tests.  6) Chronic Care/Health Maintenance:  -Patient is on ACEI/ARB and Statin medications and encouraged to continue to follow up with Ophthalmology, Podiatrist at least yearly or according to recommendations, and advised to  stay away from smoking. I have recommended yearly flu vaccine and pneumonia vaccination at least every 5 years; moderate intensity exercise for up to 150 minutes weekly; and  sleep for at least 7 hours a day.  - 30 minutes of time was spent on the care of this patient , 50% of which was applied for counseling on diabetes complications and their preventions.  - Patient to bring meter and  blood glucose logs during their next visit.   - I advised patient to maintain close follow up with Purvis Kilts, MD for primary care needs.  Follow up plan: - Return in about 3 months (around 10/25/2016) for follow up with pre-visit labs, meter, and logs.  Glade Lloyd, MD Phone: [phone]  Fax: [phone]   07/28/2016, 11:00 AM

## 2016-07-30 ENCOUNTER — Ambulatory Visit (INDEPENDENT_AMBULATORY_CARE_PROVIDER_SITE_OTHER): Payer: PPO | Admitting: Adult Health

## 2016-07-30 ENCOUNTER — Encounter: Payer: Self-pay | Admitting: Adult Health

## 2016-07-30 VITALS — BP 162/80 | HR 77 | Ht 63.0 in | Wt 146.0 lb

## 2016-07-30 DIAGNOSIS — Z950 Presence of cardiac pacemaker: Secondary | ICD-10-CM

## 2016-07-30 DIAGNOSIS — I313 Pericardial effusion (noninflammatory): Secondary | ICD-10-CM | POA: Diagnosis not present

## 2016-07-30 DIAGNOSIS — I4891 Unspecified atrial fibrillation: Secondary | ICD-10-CM | POA: Diagnosis not present

## 2016-07-30 DIAGNOSIS — I3139 Other pericardial effusion (noninflammatory): Secondary | ICD-10-CM

## 2016-07-30 MED ORDER — DILTIAZEM HCL ER COATED BEADS 300 MG PO TB24: 300.0000 mg | ORAL_TABLET | Freq: Every day | ORAL | 6 refills | Status: DC

## 2016-07-30 NOTE — Progress Notes (Signed)
Name: Virginia Huber    DOB: 10-Jun-1946  Age: 71 y.o.  MR#: [id number]       PCP:  Purvis Kilts, MD      Insurance: Payor: [employer] / Plan: Tenn[employer] Must / Product Type: *No Product type* /   CC:    Chief Complaint  Patient presents with  . Coronary Artery Disease  . Atrial Fibrillation    VS Vitals:   07/30/16 1538  Weight: 146 lb (66.2 kg)  Height: 5\' 3"  (1.6 m)    Weights Current Weight  07/30/16 146 lb (66.2 kg)  07/28/16 146 lb (66.2 kg)  07/28/16 146 lb (66.2 kg)    Blood Pressure  BP Readings from Last 3 Encounters:  07/28/16 131/73  07/06/16 124/61  06/28/16 114/81     Admit date:  (Not on file) Last encounter with RMR:  05/11/2016   Allergy Penicillins and Percocet [oxycodone-acetaminophen]  Current Outpatient Prescriptions  Medication Sig Dispense Refill  . acetaminophen (TYLENOL) 325 MG tablet Take 650 mg by mouth every 6 (six) hours as needed for headache.    [name] amiodarone (PACERONE) 200 MG tablet Take 1 tablet (200 mg total) by mouth daily. 30 tablet 6  . apixaban (ELIQUIS) 5 MG TABS tablet Take 1 tablet (5 mg total) by mouth 2 (two) times daily. 56 tablet 0  . atorvastatin (LIPITOR) 80 MG tablet Take 1 tablet (80 mg total) by mouth daily at 6 PM. 30 tablet 6  . cholecalciferol (VITAMIN D) 1000 UNITS tablet Take 1,000 Units by mouth at bedtime.     . clopidogrel (PLAVIX) 75 MG tablet Take 1 tablet (75 mg total) by mouth daily. 90 tablet 3  . diltiazem (CARDIZEM CD) 240 MG 24 hr capsule Take 1 capsule (240 mg total) by mouth daily. 30 capsule 3  . diphenhydramine-acetaminophen (TYLENOL PM EXTRA STRENGTH) 25-500 MG TABS tablet Take 1 tablet by mouth at bedtime as needed.    . furosemide (LASIX) 40 MG tablet Take 1 tablet (40 mg total) by mouth daily. 30 tablet 3  . insulin NPH-regular Human (NOVOLIN 70/30) (70-30) 100 UNIT/ML injection Inject 30 Units into the skin See admin instructions. Inject 30 units before breakfast and 30 units  before supper  - only if pre-meal blood glucose is above 90 mg/dL.    [name] levothyroxine (SYNTHROID, LEVOTHROID) 150 MCG tablet Take 150 mcg by mouth daily before breakfast.    . losartan (COZAAR) 100 MG tablet TAKE ONE (1) TABLET BY MOUTH EVERY DAY 90 tablet 3  . magnesium oxide (MAG-OX) 400 MG tablet Take 400 mg by mouth daily.    . metFORMIN (GLUCOPHAGE) 500 MG tablet Take 500 mg by mouth 2 (two) times daily with a meal.    . metoprolol (LOPRESSOR) 100 MG tablet Take 1 tablet (100 mg total) by mouth 2 (two) times daily. 60 tablet 3  . nitroGLYCERIN (NITROSTAT) 0.4 MG SL tablet Place 1 tablet (0.4 mg total) under the tongue every 5 (five) minutes as needed for chest pain. 25 tablet 12  . Omega-3 Fatty Acids (FISH OIL) 1200 MG CAPS Take 2 capsules (2,400 mg total) by mouth 2 (two) times daily. 120 each 3  . potassium chloride SA (KLOR-CON M20) 20 MEQ tablet Take 1 tablet (20 mEq total) by mouth daily. 90 tablet 3  . rOPINIRole (REQUIP) 0.5 MG tablet Take 0.5 mg by mouth at bedtime.   0  . sodium chloride (OCEAN) 0.65 % SOLN nasal spray Place 1 spray into both  nostrils as needed for congestion. (Patient taking differently: Place 1 spray into both nostrils at bedtime as needed for congestion. ) 1 Bottle 0   No current facility-administered medications for this visit.     Discontinued Meds:   There are no discontinued medications.  Patient Active Problem List   Diagnosis Date Noted  . Paroxysmal atrial flutter ([place]) 06/28/2016  . S/P pericardiocentesis 06/28/2016  . Acute blood loss anemia 06/28/2016  . Pericardial effusion 06/26/2016  . Tachy-brady syndrome ([place]) 06/25/2016  . Tamponade   . Bradycardia 06/14/2016  . Junctional bradycardia   . Coronary artery disease involving coronary bypass graft of native heart with unstable angina pectoris ([place])   . Acute diastolic CHF (congestive heart failure), NYHA class 3 ([place])   . Systolic congestive heart failure ([place]) 05/13/2016  . Multiple and  bilateral precerebral artery syndromes 05/13/2016  . OSA (obstructive sleep apnea) 05/13/2016  . Chronic diarrhea 12/25/2015  . Atrial fibrillation with RVR ([place]) 10/28/2015  . Atrial fibrillation with tachycardic ventricular rate ([place]) 09/17/2015  . Chest pain 08/02/2015  . Atrial fibrillation with rapid ventricular response ([place])   . Pain with urination 05/08/2015  . NSTEMI (non-ST elevated myocardial infarction) ([place]) 04/02/2015  . CAD in native artery 11/30/2014  . 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   . PAD (peripheral artery disease) ([place]) 12/26/2013  . Superficial fungus infection of skin 06/29/2013  . UTI (urinary tract infection) 05/08/2013  . Hypokalemia 03/05/2012  . B12 deficiency anemia 03/02/2012  . Bronchospasm 03/02/2012  . Community acquired bacterial pneumonia 03/01/2012  . Acute respiratory failure ([place]) 03/01/2012  . DM type 2 causing vascular disease ([place]) 02/29/2012  . Paroxysmal atrial fibrillation with RVR ([place]) 02/28/2012  . Hypothyroidism 02/28/2012  . RLQ abdominal pain 11/24/2010  . OVERWEIGHT/OBESITY 06/03/2010  . Essential hypertension 06/03/2010  . Hyperlipidemia 12/27/2009  . Palpitations 05/17/2009  . FRACTURE, TOE 12/06/2007    LABS    Component Value Date/Time   NA 141 07/21/2016 0903   NA 140 06/23/2016 1550   NA 141 06/15/2016 0345   NA 139 06/13/2016 2339   K 4.5 07/21/2016 0903   K 4.7 06/23/2016 1550   K 3.7 06/15/2016 0345   CL 106 07/21/2016 0903   CL 101 06/23/2016 1550   CL 107 06/15/2016 0345   CO2 25 07/21/2016 0903   CO2 23 06/23/2016 1550   CO2 26 06/15/2016 0345   GLUCOSE 104 (H) 07/21/2016 0903   GLUCOSE 219 (H) 06/23/2016 1550   GLUCOSE 236 (H) 06/15/2016 0345   GLUCOSE 269 (H) 06/13/2016 2339   BUN 23 07/21/2016 0903   BUN 21 06/23/2016 1550   BUN 17 06/15/2016 0345   BUN 24 (H) 06/13/2016 2339   CREATININE 1.31 (H) 07/21/2016 0903   CREATININE 0.99  06/23/2016 1550   CREATININE 0.90 06/15/2016 0345   CREATININE 1.02 (H) 06/13/2016 2339   CREATININE 0.88 04/14/2016 0910   CREATININE 0.96 (H) 10/14/2015 0931   CALCIUM 9.0 07/21/2016 0903   CALCIUM 9.5 06/23/2016 1550   CALCIUM 9.0 06/15/2016 0345   GFRNONAA 58 (L) 06/23/2016 1550   GFRNONAA >60 06/15/2016 0345   GFRNONAA 54 (L) 06/13/2016 2339   GFRAA 66 06/23/2016 1550   GFRAA >60 06/15/2016 0345   GFRAA >60 06/13/2016 2339   CMP     Component Value Date/Time   NA 141 07/21/2016 0903   NA 140 06/23/2016 1550   K 4.5 07/21/2016 5956  CL 106 07/21/2016 0903   CO2 25 07/21/2016 0903   GLUCOSE 104 (H) 07/21/2016 0903   BUN 23 07/21/2016 0903   BUN 21 06/23/2016 1550   CREATININE 1.31 (H) 07/21/2016 0903   CALCIUM 9.0 07/21/2016 0903   PROT 5.8 (L) 07/21/2016 0903   ALBUMIN 3.6 07/21/2016 0903   AST 9 (L) 07/21/2016 0903   ALT 8 07/21/2016 0903   ALKPHOS 77 07/21/2016 0903   BILITOT 0.5 07/21/2016 0903   GFRNONAA 58 (L) 06/23/2016 1550   GFRAA 66 06/23/2016 1550       Component Value Date/Time   WBC 9.2 06/28/2016 0246   WBC 10.5 06/26/2016 0204   WBC 9.4 06/23/2016 1550   WBC 9.9 06/15/2016 0345   HGB 8.6 (L) 06/28/2016 0246   HGB 9.5 (L) 06/26/2016 0204   HGB 11.8 (L) 06/15/2016 0345   HCT 26.1 (L) 06/28/2016 0246   HCT 29.1 (L) 06/26/2016 0204   HCT 37.9 06/23/2016 1550   HCT 36.4 06/15/2016 0345   MCV 90.6 06/28/2016 0246   MCV 90.7 06/26/2016 0204   MCV 91 06/23/2016 1550   MCV 91.5 06/15/2016 0345    Lipid Panel     Component Value Date/Time   CHOL 116 (L) 04/14/2016 0910   TRIG 439 (H) 04/14/2016 0910   HDL 24 (L) 04/14/2016 0910   CHOLHDL 4.8 04/14/2016 0910   VLDL NOT CALC 04/14/2016 0910   LDLCALC NOT CALC 04/14/2016 0910    ABG    Component Value Date/Time   PHART 7.415 03/01/2012 2125   PCO2ART 32.4 (L) 03/01/2012 2125   PO2ART 59.8 (L) 03/01/2012 2125   HCO3 20.4 03/01/2012 2125   TCO2 22 02/15/2015 1344   ACIDBASEDEF 3.4 (H)  03/01/2012 2125   O2SAT 93.1 03/01/2012 2125     Lab Results  Component Value Date   TSH 2.36 04/14/2016   BNP (last 3 results)  Recent Labs  09/16/15 2228 10/02/15 1550 06/07/16 0039  BNP 123.0* 459.0* 155.0*    ProBNP (last 3 results) No results for input(s): PROBNP in the last 8760 hours.  Cardiac Panel (last 3 results) No results for input(s): CKTOTAL, CKMB, TROPONINI, RELINDX in the last 72 hours.  Iron/TIBC/Ferritin/ %Sat    Component Value Date/Time   IRON 16 (L) 03/02/2012 0854   TIBC 211 (L) 03/02/2012 0854   FERRITIN 433 (H) 03/02/2012 0854   IRONPCTSAT 8 (L) 03/02/2012 0854     EKG Orders placed or performed in visit on 07/01/16  . EKG 12-Lead     Prior Assessment and Plan Problem List as of 07/30/2016 Reviewed: 07/28/2016 10:37 AM by Glade Lloyd, MD     Cardiovascular and Mediastinum   Essential hypertension   Last Assessment & Plan 11/06/2014 Office Visit Written 11/06/2014  5:15 PM by Wellington Hampshire, MD    Blood pressure is well controlled on current medication.      Paroxysmal atrial fibrillation with RVR [facility])   Last Assessment & Plan 11/01/2012 Office Visit Written 11/01/2012 10:46 AM by Renella Cunas, MD    No clinical recurrence.      DM type 2 causing vascular disease ([place])   PAD (peripheral artery disease) [facility])   Last Assessment & Plan 11/06/2014 Office Visit Written 11/06/2014  5:15 PM by Wellington Hampshire, MD    Claudication overall improved with cilostazol and a walking program. Current hip pain seems to be due to bursitis and not PAD. There was no evidence of aortoiliac disease.  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   CAD in native artery   NSTEMI (non-ST elevated myocardial infarction) ([place])   Atrial fibrillation with rapid ventricular response ([facility])   Atrial fibrillation with tachycardic ventricular rate ([facility])   Atrial fibrillation with RVR ([facility])   Systolic congestive heart  failure ([facility])   Multiple and bilateral precerebral artery syndromes   Junctional bradycardia   Coronary artery disease involving coronary bypass graft of native heart with unstable angina pectoris ([facility])   Acute diastolic CHF (congestive heart failure), NYHA class 3 ([facility])   Tamponade   Tachy-brady syndrome ([facility])   Pericardial effusion   Paroxysmal atrial flutter ([facility])     Respiratory   Community acquired bacterial pneumonia   Acute respiratory failure ([facility])   OSA (obstructive sleep apnea)     Digestive   Chronic diarrhea   Last Assessment & Plan 04/01/2016 Office Visit Written 04/01/2016  1:31 PM by Annitta Needs, NP    Chronic diarrhea without alarm symptoms. Last colonoscopy 2012 with normal TI and normal colonic biopsies, heme negative Jan 2017. Recent numerous cardiac events earlier this year. Appears to be metformin-related. Will be seeing Dr. Dorris Fetch soon. I have asked her to discuss this with him, as there are multiple different agents for diabetes management that may be better for her. Hopefully, she can find a better option for her. If not, she has a prescription for Colestid that we will trial. Follow-up in 3 months.         Endocrine   Hypothyroidism     Musculoskeletal and Integument   FRACTURE, TOE     Genitourinary   UTI (urinary tract infection)     Other   Hyperlipidemia   Last Assessment & Plan 11/06/2014 Office Visit Written 11/06/2014  5:16 PM by Wellington Hampshire, MD    Continue treatment with atorvastatin with a target LDL of less than 70.       Palpitations   OVERWEIGHT/OBESITY   Last Assessment & Plan 05/05/2011 Office Visit Written 05/05/2011 11:37 AM by Renella Cunas, MD    Markedly improved. Continue to keep weight off.      RLQ abdominal pain   Last Assessment & Plan 11/24/2010 Office Visit Edited 12/04/2010  2:20 PM by Orvil Feil, NP    71 year old female with several month hx of RLQ pain, achy, intermittent in past, now constant for past several weeks.  Not relieved by anything. Exacerbated by movement. No change in bowel habits. Has BM 3-4X per day. No melena or brbpr. Afebrile, no chills. No lack of appetite; does have purposeful wt loss secondary to diagnosis of diabetes. Has had multiple abdominal surgeries in past: see PMH/PSH. 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 Unknown path report at this time; requesting records.  Likely needs updated colonoscopy, but we will proceed with CT abd/pelvis to assess for other etiology prior to invasive procedure. Differentials include adhesive disease, less likely diverticular origin, functional abdominal pain.   Proceed with CT abd/pelvis Creatinine prior; hold metformin for 48 hours after scan Obtain path from polyp in 2005 Likely needs updated colonoscopy; will review CT first The R/B/A have been discussed in detail with pt regarding a colonoscopy; she states understanding and is willing to proceed if necessary.   ADDENDUM: ifobt +, CBC nl. Pt scheduled for 6/15 colonoscopy      B12 deficiency anemia   Bronchospasm   Hypokalemia   Superficial fungus infection of  skin   Pain with urination   Chest pain   Bradycardia   S/P pericardiocentesis   Acute blood loss anemia       Imaging: No results found.

## 2016-07-30 NOTE — Progress Notes (Signed)
Cardiology Office Note   Date:  07/30/2016   ID:  Takyra, Cantrall [birth year], MRN [id number]  PCP:  Purvis Kilts, MD  Cardiologist: Woodroe Chen, NP   Chief Complaint  Patient presents with  . Coronary Artery Disease  . Atrial Fibrillation  . Pericardial Effusion      History of Present Illness: Virginia Huber is a 71 y.o. female who presents for ongoing assessment and management of coronary artery disease, status post PTCA with scoring balloon to the OM1 and on 6 2016, history of non-ST elevation MI in 10 2016 status post drug-eluting stent to the proximal mid circumflex, non-ST elevation MI in 2 2017 status post scoring balloon and PTCA/DES to the distal right coronary artery with PAF during that admission. Other history includes transient atrial flutter in the setting of pneumonia, lower extremity PAD followed by Dr.Arida, hypertension, hyperlipidemia, with other history to include diabetes, TIAs, heart block, and other multiple medical issues.  The patient was last seen by Dr. Curt Bears, on 06/28/2016 after admission for pacemaker implantation in the setting of tachybradycardia syndrome. On 06/26/15 she underwent implantation of a Medtronic Advisa DR MRI SureScan model [id number] (serial number [id number]) pacemaker. Post-procedurally she developed pleuritic chest discomfort and hypotension. She was found to have pericardial effusion with tamponade with lead perforation. This led to an emergent pericardialcentesis with subsequent pericardial drain. She was to restart on ELIQUIS, with plans for TEE cardioversion on 07/02/2016 if she remained in atrial fibrillation. Also to restart amiodarone if she was not found to have thrombus.  Echocardiogram 06/27/2016 Left ventricle: The cavity size was normal. There was moderate   focal basal and mild concentric hypertrophy. Systolic function   was normal. The estimated ejection fraction was 55%. Wall motion   was normal;  there were no regional wall motion abnormalities. Due   to tachycardia, there was fusion of early and atrial   contributions to ventricular filling. - Aortic valve: Trileaflet; mildly thickened, mildly calcified   leaflets. - Left atrium: The atrium was moderately dilated. - Right ventricle: Systolic function was mildly reduced. - Pulmonary arteries: Systolic pressure could not be accurately   estimated. - Pericardium, extracardiac: A small, free-flowing pericardial   effusion was identified posterior to the heart. The fluid had no   internal echoes.There was no evidence of hemodynamic compromise.  She states that she did not have a TEE cardioversion as she converted to normal sinus rhythm with medication. This was canceled when she was evaluated prior to the procedure.  She comes today feeling well with the exception of noticing that her blood pressure is remaining elevated since having had a pacemaker placed. She also is having some dependent edema. She will occasionally take extra doses of Lasix which is helpful to her. She denies rapid heart rhythm, bleeding issues, or shortness of breath. She is having some trouble sleeping.  Past Medical History:  Diagnosis Date  . Arthritis   . Atrial fibrillation and flutter ([place])    a. h/o PAF/flutter during admission in 2013 for PNA. b. PAF during adm for NSTEMI 07/2015, subsequent paroxysms since then.  . B12 deficiency anemia   . Cardiac tamponade 06/2016  . Coronary artery disease 11/30/2014   a. remote MI. b. h/o PTCA with scoring balloon to OM1 11/2014. c. NSTEMI 03/2015 s/p DES to prox-mid Cx. d. NSTEMI 07/2015 s/p scoring balloon/PTCA/DES to dRCA with PAF during that admission  . Cutaneous lupus erythematosus   . GERD (  gastroesophageal reflux disease)   . Heart block   . History of blood transfusion 1980's   2nd surgical procedures  . HTN (hypertension)   . Hypercholesteremia   . Hypothyroidism   . Myocardial infarction 02/2012  .  Ovarian tumor   . PAD (peripheral artery disease) ([place])    a. s/p LE angio 2015; followed by Dr. Fletcher Anon - managed medically.  . Pain with urination 05/08/2015  . Paroxysmal atrial flutter ([place])   . Pericardial effusion    a. 06/2016 after ppm - s/p pericardiocentesis.  . Superficial fungus infection of skin 06/29/2013  . Tachy-brady syndrome ([place])    a. s/p Medtronic PPM 06/2016, c/b lead perf/pericardial effusion.  [name] TIA (transient ischemic attack) 08/2001; ~ 2006  . Type II diabetes mellitus ([place])   . UTI (urinary tract infection) 05/08/2013    Past Surgical History:  Procedure Laterality Date  . ABDOMINAL AORTAGRAM N/A 01/03/2014   Procedure: ABDOMINAL [name];  Surgeon: Wellington Hampshire, MD;  Location: [facility];  Service: Cardiovascular;  Laterality: N/A;  . ABDOMINAL HYSTERECTOMY  1972   "partial"  . APPENDECTOMY  1970's  . CARDIAC CATHETERIZATION  2008   Tiny OM-2 with 90% narrowing. Med tx.  [name] CARDIAC CATHETERIZATION N/A 11/30/2014   Procedure: Left Heart Cath and Coronary Angiography;  Surgeon: Troy Sine, MD; LAD 20%, CFX 50%, OM1 95%, right PLB 30%, LV normal   . CARDIAC CATHETERIZATION N/A 11/30/2014   Procedure: Coronary Balloon Angioplasty;  Surgeon: Troy Sine, MD;  Angiosculpt scoring balloon and PTCA to the OM1 reducing stenosis from 95% to less than 10%  . CARDIAC CATHETERIZATION N/A 04/03/2015   Procedure: Left Heart Cath and Coronary Angiography;  Surgeon: Jolaine Artist, MD; dLAD 50%, CFX 90%, OM1 100%, PLA 15%, LVEDP 13    . CARDIAC CATHETERIZATION N/A 04/03/2015   Procedure: Coronary Stent Intervention;  Surgeon: Sherren Mocha, MD; 3.0x18 mm Xience DES to the CFX    . CARDIAC CATHETERIZATION N/A 08/02/2015   Procedure: Left Heart Cath and Coronary Angiography;  Surgeon: Troy Sine, MD;  Location: [facility];  Service: Cardiovascular;  Laterality: N/A;  . CARDIAC CATHETERIZATION N/A 08/02/2015   Procedure: Coronary Stent Intervention;   Surgeon: Troy Sine, MD;  Location: [facility];  Service: Cardiovascular;  Laterality: N/A;  . CARDIAC CATHETERIZATION N/A 06/25/2016   Procedure: Pericardiocentesis;  Surgeon: Will Meredith Leeds, MD;  Location: [facility];  Service: Cardiovascular;  Laterality: N/A;  . cardiac stents    . CHOLECYSTECTOMY OPEN  1990's  . COLONOSCOPY  2005   Dr. Laural Golden: pancolonic divericula, polyp, path unknown currently  . COLONOSCOPY  2012   Dr. Oneida Alar: Normal TI, scattered diverticula in entire colon, small internal hemorrhoids, normal colon biopsies. Colonoscopy in 5-10 years.   . COLOSTOMY  05/1979  . COLOSTOMY REVERSAL  11/1979  . EP IMPLANTABLE DEVICE N/A 06/25/2016   Procedure: Lead Revision/Repair;  Surgeon: Will Meredith Leeds, MD;  Location: [facility];  Service: Cardiovascular;  Laterality: N/A;  . EP IMPLANTABLE DEVICE N/A 06/25/2016   Procedure: Pacemaker Implant;  Surgeon: Will Meredith Leeds, MD;  Location: [facility];  Service: Cardiovascular;  Laterality: N/A;  . EXCISIONAL HEMORRHOIDECTOMY  1970's  . EYE SURGERY Left 2000   "branch vein occlusion"  . EYE SURGERY Left ~ 2001   "smoothed out wrinkle"  . LEFT OOPHORECTOMY  05/1979   nicked bowel, peritonitis, colostomy; colostomy reversed 1981   .  LOWER EXTREMITY ANGIOGRAM N/A 01/03/2014   Procedure: LOWER EXTREMITY ANGIOGRAM;  Surgeon: Wellington Hampshire, MD;  Location: [facility];  Service: Cardiovascular;  Laterality: N/A;  . Nuclear med stress test  10/2011   Small area of mild ischemia inferoapically.  [name] PARTIAL HYSTERECTOMY  1970's   left ovaries, then ovaries removed later due tumors   . RIGHT OOPHORECTOMY  1970's     Current Outpatient Prescriptions  Medication Sig Dispense Refill  . acetaminophen (TYLENOL) 325 MG tablet Take 650 mg by mouth every 6 (six) hours as needed for headache.    [name] amiodarone (PACERONE) 200 MG tablet Take 1 tablet (200 mg total) by mouth daily. 30 tablet 6  . apixaban  (ELIQUIS) 5 MG TABS tablet Take 1 tablet (5 mg total) by mouth 2 (two) times daily. 56 tablet 0  . atorvastatin (LIPITOR) 80 MG tablet Take 1 tablet (80 mg total) by mouth daily at 6 PM. 30 tablet 6  . cholecalciferol (VITAMIN D) 1000 UNITS tablet Take 1,000 Units by mouth at bedtime.     . clopidogrel (PLAVIX) 75 MG tablet Take 1 tablet (75 mg total) by mouth daily. 90 tablet 3  . diphenhydramine-acetaminophen (TYLENOL PM EXTRA STRENGTH) 25-500 MG TABS tablet Take 1 tablet by mouth at bedtime as needed.    . furosemide (LASIX) 40 MG tablet Take 1 tablet (40 mg total) by mouth daily. 30 tablet 3  . insulin NPH-regular Human (NOVOLIN 70/30) (70-30) 100 UNIT/ML injection Inject 30 Units into the skin See admin instructions. Inject 30 units before breakfast and 30 units before supper  - only if pre-meal blood glucose is above 90 mg/dL.    [name] levothyroxine (SYNTHROID, LEVOTHROID) 150 MCG tablet Take 150 mcg by mouth daily before breakfast.    . losartan (COZAAR) 100 MG tablet TAKE ONE (1) TABLET BY MOUTH EVERY DAY 90 tablet 3  . magnesium oxide (MAG-OX) 400 MG tablet Take 400 mg by mouth daily.    . metFORMIN (GLUCOPHAGE) 500 MG tablet Take 500 mg by mouth 2 (two) times daily with a meal.    . metoprolol (LOPRESSOR) 100 MG tablet Take 1 tablet (100 mg total) by mouth 2 (two) times daily. 60 tablet 3  . nitroGLYCERIN (NITROSTAT) 0.4 MG SL tablet Place 1 tablet (0.4 mg total) under the tongue every 5 (five) minutes as needed for chest pain. 25 tablet 12  . Omega-3 Fatty Acids (FISH OIL) 1200 MG CAPS Take 2 capsules (2,400 mg total) by mouth 2 (two) times daily. 120 each 3  . potassium chloride SA (KLOR-CON M20) 20 MEQ tablet Take 1 tablet (20 mEq total) by mouth daily. 90 tablet 3  . rOPINIRole (REQUIP) 0.5 MG tablet Take 0.5 mg by mouth at bedtime.   0  . sodium chloride (OCEAN) 0.65 % SOLN nasal spray Place 1 spray into both nostrils as needed for congestion. (Patient taking differently: Place 1 spray  into both nostrils at bedtime as needed for congestion. ) 1 Bottle 0  . diltiazem (CARDIZEM LA) 300 MG 24 hr tablet Take 1 tablet (300 mg total) by mouth daily. 30 tablet 6   No current facility-administered medications for this visit.     Allergies:   Penicillins and Percocet [oxycodone-acetaminophen]    Social History:  The patient  reports that she has never smoked. She has never used smokeless tobacco. She reports that she does not drink alcohol or use drugs.   Family History:  The patient's family history includes Diabetes  in her brother; Heart disease in her brother, brother, father, mother, and sister; Lupus in her daughter; Thyroid disease in her brother and brother.    ROS: All other systems are reviewed and negative. Unless otherwise mentioned in H&P    PHYSICAL EXAM: VS:  BP (!) 162/80   Pulse 77   Ht 5\' 3"  (1.6 m)   Wt 146 lb (66.2 kg)   SpO2 98%   BMI 25.86 kg/m  , BMI Body mass index is 25.86 kg/m. GEN: Well nourished, well developed, in no acute distress  HEENT: normal  Neck: no JVD, carotid bruits, or masses Cardiac: RRR; no murmurs, rubs, or gallops,no edema  Respiratory:  Clear to auscultation bilaterally, normal work of breathing GI: soft, nontender, nondistended, + BS MS: no deformity or atrophy pacemaker insertion site is well-healed without evidence of infection. Skin: warm and dry, no rash Neuro:  Strength and sensation are intact Psych: euthymic mood, full affect   EKG:  The ekg ordered today demonstrates paced   Recent Labs: 03/13/2016: Magnesium 1.9 04/14/2016: TSH 2.36 06/07/2016: B Natriuretic Peptide 155.0 06/28/2016: Hemoglobin 8.6; Platelets 208 07/21/2016: ALT 8; BUN 23; Creat 1.31; Potassium 4.5; Sodium 141    Lipid Panel    Component Value Date/Time   CHOL 116 (L) 04/14/2016 0910   TRIG 439 (H) 04/14/2016 0910   HDL 24 (L) 04/14/2016 0910   CHOLHDL 4.8 04/14/2016 0910   VLDL NOT CALC 04/14/2016 0910   LDLCALC NOT CALC 04/14/2016  0910      Wt Readings from Last 3 Encounters:  07/30/16 146 lb (66.2 kg)  07/28/16 146 lb (66.2 kg)  07/28/16 146 lb (66.2 kg)      Other studies Reviewed:   ASSESSMENT AND PLAN:  1.  Atrial fibrillation: Currently in paced rhythm without underlying atrial fib. She remains on amiodarone 200 mg daily along with diltiazem 240 mg daily and metoprolol 100 mg twice a day. She is also on ELIQUIS 5 mg twice a day and is not complaining of any bleeding issues excessive bruising or melena. She remains on Plavix 75 mg daily. She is without any complaints of rapid heart rhythm.  2. Hypertension: Patient's blood pressure is not well controlled. I rechecked it manually and found it still to be elevated at 168/88. She states that it is elevated also at home. I will increase her diltiazem from 240 mg to 300 mg daily. She will come back in one week for a blood pressure check, and take her blood pressure at home and record on the sheet provided. The patient does have some complaints of lower extremity edema which worsens on occasion. For which she takes extra doses of Lasix.  3. Pericardial effusion: Repeat echocardiogram for evaluation of improvement.   4. Pacemaker in situ: Is due to follow-up with EP in 3 months. She is is not been set up for remote checks at this time. She had complications post-pacemaker insertion with cardiac tamponade. Repeat echo for evaluation of pericardial effusion is planned.  5. Coronary artery disease: Multiple coronary artery stents with PTCA and scoring balloon angioplasty. She remains on Plavix, beta blocker, and statin therapy.   Current medicines are reviewed at length with the patient today.    Labs/ tests ordered today include:   Orders Placed This Encounter  Procedures  . EKG 12-Lead  . ECHOCARDIOGRAM COMPLETE     Disposition:   FU with One month with Dr. Bronson Ing, blood pressure check in one week, and repeat echocardiogram to  be read  Signed, Jory Sims, NP  07/30/2016 4:28 PM    [facility] [address] [place], [place] [address] Phone: [phone]; Fax: [phone]

## 2016-07-30 NOTE — Patient Instructions (Addendum)
Your physician recommends that you schedule a follow-up appointment in: March with Dr. Bronson Ing  Your physician recommends that you schedule a follow-up appointment for a Blood Pressure Check   Your physician has recommended you make the following change in your medication: Increase Cardizem to 300 mg Daily   Your physician has requested that you have an echocardiogram. Echocardiography is a painless test that uses sound waves to create images of your heart. It provides your doctor with information about the size and shape of your heart and how well your heart's chambers and valves are working. This procedure takes approximately one hour. There are no restrictions for this procedure.  If you need a refill on your cardiac medications before your next appointment, please call your pharmacy.  Thank you for choosing [facility]!

## 2016-07-31 ENCOUNTER — Telehealth: Payer: Self-pay | Admitting: Adult Health

## 2016-07-31 ENCOUNTER — Ambulatory Visit: Payer: PPO | Admitting: Cardiovascular Disease

## 2016-07-31 MED ORDER — DILTIAZEM HCL ER COATED BEADS 360 MG PO CP24: 360.0000 mg | ORAL_CAPSULE | Freq: Every day | ORAL | 11 refills | Status: DC

## 2016-07-31 NOTE — Telephone Encounter (Signed)
Please contact [facility] regarding RX for Diltiazem. Stated that they could not fill it because it was not time per insurance. / tg

## 2016-07-31 NOTE — Addendum Note (Signed)
Addended by: Barbarann Ehlers A on: 07/31/2016 09:54 AM   Modules accepted: Orders

## 2016-07-31 NOTE — Telephone Encounter (Signed)
Per Arnold Long NP, change Diltiazem ER to 360 mg from 240 mg as insurance will not cover 300 mg dose as formulary.    Patient aware

## 2016-07-31 NOTE — Telephone Encounter (Signed)
Per pharmacy,cardizem 300 mg is not covered on formulary,needs PA,they will fax form to me.Pt made aware

## 2016-08-03 ENCOUNTER — Ambulatory Visit: Payer: PPO | Admitting: *Deleted

## 2016-08-03 ENCOUNTER — Ambulatory Visit (HOSPITAL_COMMUNITY)
Admission: RE | Admit: 2016-08-03 | Discharge: 2016-08-03 | Disposition: A | Discharge status: Home / Self Care | Payer: PPO | Source: Ambulatory Visit | Attending: Adult Health | Admitting: Adult Health

## 2016-08-03 VITALS — BP 162/68 | HR 70

## 2016-08-03 DIAGNOSIS — I252 Old myocardial infarction: Secondary | ICD-10-CM | POA: Insufficient documentation

## 2016-08-03 DIAGNOSIS — I313 Pericardial effusion (noninflammatory): Secondary | ICD-10-CM

## 2016-08-03 DIAGNOSIS — I3139 Other pericardial effusion (noninflammatory): Secondary | ICD-10-CM

## 2016-08-03 DIAGNOSIS — I340 Nonrheumatic mitral (valve) insufficiency: Secondary | ICD-10-CM | POA: Insufficient documentation

## 2016-08-03 DIAGNOSIS — I119 Hypertensive heart disease without heart failure: Secondary | ICD-10-CM | POA: Diagnosis not present

## 2016-08-03 DIAGNOSIS — I358 Other nonrheumatic aortic valve disorders: Secondary | ICD-10-CM | POA: Insufficient documentation

## 2016-08-03 DIAGNOSIS — E785 Hyperlipidemia, unspecified: Secondary | ICD-10-CM | POA: Insufficient documentation

## 2016-08-03 DIAGNOSIS — I2510 Atherosclerotic heart disease of native coronary artery without angina pectoris: Secondary | ICD-10-CM | POA: Diagnosis not present

## 2016-08-03 DIAGNOSIS — E119 Type 2 diabetes mellitus without complications: Secondary | ICD-10-CM | POA: Diagnosis not present

## 2016-08-03 DIAGNOSIS — I10 Essential (primary) hypertension: Secondary | ICD-10-CM

## 2016-08-03 NOTE — Progress Notes (Signed)
Patient has no complaints at this time. Pt BP on her monitor is 172/90 with HR of 61. She states that edema has decreased. Manual BP today is 168/68 with HR of 70. Will forward to K.Purcell Nails, NP for review.

## 2016-08-03 NOTE — Progress Notes (Signed)
*  PRELIMINARY RESULTS* Echocardiogram 2D Echocardiogram has been performed.  Leavy Cella 08/03/2016, 2:40 PM

## 2016-08-04 ENCOUNTER — Telehealth: Payer: Self-pay | Admitting: *Deleted

## 2016-08-04 MED ORDER — HYDRALAZINE HCL 10 MG PO TABS: 10.0000 mg | ORAL_TABLET | Freq: Three times a day (TID) | ORAL | 11 refills | Status: DC

## 2016-08-04 NOTE — Telephone Encounter (Signed)
-----   Message from Lendon Colonel, NP sent at 08/03/2016  4:50 PM EST ----- Begin hydralazine 10 mg TID  Have her keep a check on her BP at home.

## 2016-08-11 DIAGNOSIS — Z1389 Encounter for screening for other disorder: Secondary | ICD-10-CM | POA: Diagnosis not present

## 2016-08-11 DIAGNOSIS — E119 Type 2 diabetes mellitus without complications: Secondary | ICD-10-CM | POA: Diagnosis not present

## 2016-08-11 DIAGNOSIS — I482 Chronic atrial fibrillation: Secondary | ICD-10-CM | POA: Diagnosis not present

## 2016-08-11 DIAGNOSIS — Z6826 Body mass index (BMI) 26.0-26.9, adult: Secondary | ICD-10-CM | POA: Diagnosis not present

## 2016-08-11 DIAGNOSIS — R070 Pain in throat: Secondary | ICD-10-CM | POA: Diagnosis not present

## 2016-08-11 DIAGNOSIS — J209 Acute bronchitis, unspecified: Secondary | ICD-10-CM | POA: Diagnosis not present

## 2016-08-11 DIAGNOSIS — E663 Overweight: Secondary | ICD-10-CM | POA: Diagnosis not present

## 2016-08-11 DIAGNOSIS — J343 Hypertrophy of nasal turbinates: Secondary | ICD-10-CM | POA: Diagnosis not present

## 2016-08-11 DIAGNOSIS — J069 Acute upper respiratory infection, unspecified: Secondary | ICD-10-CM | POA: Diagnosis not present

## 2016-08-19 DIAGNOSIS — G4733 Obstructive sleep apnea (adult) (pediatric): Secondary | ICD-10-CM | POA: Diagnosis not present

## 2016-08-21 ENCOUNTER — Ambulatory Visit: Payer: PPO | Admitting: Cardiovascular Disease

## 2016-08-31 ENCOUNTER — Encounter: Payer: Self-pay | Admitting: Neurology

## 2016-09-01 ENCOUNTER — Ambulatory Visit (INDEPENDENT_AMBULATORY_CARE_PROVIDER_SITE_OTHER): Payer: PPO | Admitting: Neurology

## 2016-09-01 ENCOUNTER — Encounter: Payer: Self-pay | Admitting: Neurology

## 2016-09-01 VITALS — BP 130/65 | HR 90 | Resp 20 | Ht 63.0 in | Wt 145.0 lb

## 2016-09-01 DIAGNOSIS — I480 Paroxysmal atrial fibrillation: Secondary | ICD-10-CM | POA: Diagnosis not present

## 2016-09-01 DIAGNOSIS — G4733 Obstructive sleep apnea (adult) (pediatric): Secondary | ICD-10-CM

## 2016-09-01 DIAGNOSIS — Z9989 Dependence on other enabling machines and devices: Secondary | ICD-10-CM

## 2016-09-01 DIAGNOSIS — I5030 Unspecified diastolic (congestive) heart failure: Secondary | ICD-10-CM

## 2016-09-01 NOTE — Progress Notes (Signed)
SLEEP MEDICINE CLINIC   Provider:  Larey Seat, M D  Referring Provider: Sharilyn Sites, MD Primary Care Physician:  Purvis Kilts, MD  Chief Complaint  Patient presents with  . Follow-up    cpap    HPI:  Virginia Huber is a 71 y.o. female , seen here as a referral from Dr. Kate Sable,  Virginia Huber was referred by her cardiologist for a sleep study to the any [facility] location, she completed the sleep study but then was never given the results, a phone call or a follow-up appointment. She stated that she had similar experiences with the physician before when she had suffered a TIA and her follow-up appointments were not properly made and results not discussed. I was surprised to see a patient who just had a sleep study on 04/12/2016 30 days later in my office to follow-up on a study that I have not performed. Based on the report the patient has a body mass index of 24, is 71 years of age, and went to the sleep lab 04/12/2016. Her AHI was only 10 per hour which constitutes mild sleep apnea. In supine sleep her AHI was 14.3, the lowest oxygen saturation at night was 86% and the technologist noted loud snoring. She slept about half of the night in supine sleep position she reached REM sleep with an AHI of 29.3 per hour.  Between June 2016 and May 2017 this patient had been admitted 5 times to hospital with atrial fibrillation and related to diastolic output failure. As she is on high embolic risk by A. fib and the related congestive heart failure, she remains on Eloquis and Plavix. Given the constellation of mild apnea with underlying  CAD , MI 4 years ago, double pneumonia in 2015, atrial fibrillation and previous stroke-TIA, congestive heart failure and REM dependent apnea, she needs to have an urgent CPAP titration.   Sleep habits are as follows:Usual bedtime for the couple is between 12 and 1 AM, usually but neither of them has trouble to fall asleep and Virginia Huber  reports that she rises usually at 8 or 9 AM. Since she is retired she has time to sleep in. She sleeps almost equal times in supine and lateral position, she sleeps on 2 pillows for support. The bedroom is described as core, quiet and dark. Her husband uses a CPAP machine, she has nocturia frequent up to 3 times at night, fragmenting her sleep. She feels that her sleep is not restorative and refreshing and that she is a light sleeper, thus  easily awoken. Her husband has noted her to snore very loudly as a [employer] officer had reported. She often feels she hasn't slept at all because she lacks the sleeps refreshing and restorative qualities. So when she wakes up in the morning she is not necessarily feeling ready to go. She wakes up frequently with a headache in the morning indicating that she may have hypoxemia or hypercapnia. She was not checked for hypercapnia in her recent sleep study.  Sleep medical history and family sleep history:  Non family history of OSA  Social history: married, patient is a nontobacco user, has never smoked. She does not drink alcohol. She is using only decaffeinated coffees and teas. No shift work history.   Interval history from the 13's of March 2018, The patient underwent a sleep study on 06/08/2016, designated CPAP titration following an outside baseline sleep study. The  total AHI was reduced to only 2.2 / hr  under CPAP titration , SpO2  saturation  at or below 88% was 0.0 minutes .  There were no periodic limb movements. We ordered a CPAP for this patient with the set pressure of 10 cm water, she had a rather poor sleep efficiency under CPAP of 67% but has improved at home dramatically.  She continues now to use CPAP at 10 cm water pressure with 3 cm EPR and has reached  100% compliance for 30 days, 97% compliance for over 4 hours of consecutive use - she was hospitalized for 3 days within this time to receive a pacemaker. Her average user time is 8 hours and 4 minutes at night  and her residual AHI is 2.1 -given her complicated cardiac medical history, I think is an excellent result. In addition she reports an Epworth sleepiness score of only 6 points fatigue severity score of only 21 points and the geriatric depression score endorsed at only 1 out of 15 points. She noticed much more daytime alertness, she wakes up not as frequently as before CPAP, she reports no morning headaches. Only once in a while but she still have a bathroom break. Altogether this has been a good result. She was also given a higher dose of diuretics and her ankle edema has markedly improved.  Review of Systems: Out of a complete 14 system review, the patient complains of only the following symptoms, and all other reviewed systems are negative. Loud snoring, dependent odema, atrial fib, CHF, witnessed apnea, fatigue, TIA.    Epworth score 6 , Fatigue severity score  21 from 37  , depression score 1 from  2/15    Social History   Social History  . Marital status: Married    Spouse name: N/A  . Number of children: N/A  . Years of education: N/A   Occupational History  . Retired Retired    Research officer, political party   Social History Main Topics  . Smoking status: Never Smoker  . Smokeless tobacco: Never Used     Comment: Never smoked  . Alcohol use No  . Drug use: No  . Sexual activity: No     Comment: hyst   Other Topics Concern  . Not on file   Social History Narrative  . No narrative on file    Family History  Problem Relation Age of Onset  . Heart disease Mother     deceased  . Heart disease Father     deceased, heart disease  . Diabetes Brother   . Heart disease Brother   . Thyroid disease Brother   . Heart disease Sister   . Heart disease Brother   . Thyroid disease Brother   . Lupus Daughter   . Colon cancer Neg Hx     Past Medical History:  Diagnosis Date  . Arthritis   . Atrial fibrillation and flutter ([place])    a. h/o PAF/flutter during admission in 2013 for  PNA. b. PAF during adm for NSTEMI 07/2015, subsequent paroxysms since then.  . B12 deficiency anemia   . Cardiac tamponade 06/2016  . Coronary artery disease 11/30/2014   a. remote MI. b. h/o PTCA with scoring balloon to OM1 11/2014. c. NSTEMI 03/2015 s/p DES to prox-mid Cx. d. NSTEMI 07/2015 s/p scoring balloon/PTCA/DES to dRCA with PAF during that admission  . Cutaneous lupus erythematosus   . GERD (gastroesophageal reflux disease)   . Heart block   . History of blood transfusion 1980's   2nd surgical procedures  .  HTN (hypertension)   . Hypercholesteremia   . Hypothyroidism   . Myocardial infarction 02/2012  . Ovarian tumor   . PAD (peripheral artery disease) ([place])    a. s/p LE angio 2015; followed by Dr. Fletcher Anon - managed medically.  . Pain with urination 05/08/2015  . Paroxysmal atrial flutter ([place])   . Pericardial effusion    a. 06/2016 after ppm - s/p pericardiocentesis.  . Superficial fungus infection of skin 06/29/2013  . Tachy-brady syndrome ([place])    a. s/p Medtronic PPM 06/2016, c/b lead perf/pericardial effusion.  [name] TIA (transient ischemic attack) 08/2001; ~ 2006  . Type II diabetes mellitus ([place])   . UTI (urinary tract infection) 05/08/2013    Past Surgical History:  Procedure Laterality Date  . ABDOMINAL AORTAGRAM N/A 01/03/2014   Procedure: ABDOMINAL [name];  Surgeon: Wellington Hampshire, MD;  Location: [facility] CATH LAB;  Service: Cardiovascular;  Laterality: N/A;  . ABDOMINAL HYSTERECTOMY  1972   "partial"  . APPENDECTOMY  1970's  . CARDIAC CATHETERIZATION  2008   Tiny OM-2 with 90% narrowing. Med tx.  [name] CARDIAC CATHETERIZATION N/A 11/30/2014   Procedure: Left Heart Cath and Coronary Angiography;  Surgeon: Troy Sine, MD; LAD 20%, CFX 50%, OM1 95%, right PLB 30%, LV normal   . CARDIAC CATHETERIZATION N/A 11/30/2014   Procedure: Coronary Balloon Angioplasty;  Surgeon: Troy Sine, MD;  Angiosculpt scoring balloon and PTCA to the OM1 reducing stenosis from 95% to less than  10%  . CARDIAC CATHETERIZATION N/A 04/03/2015   Procedure: Left Heart Cath and Coronary Angiography;  Surgeon: Jolaine Artist, MD; dLAD 50%, CFX 90%, OM1 100%, PLA 15%, LVEDP 13    . CARDIAC CATHETERIZATION N/A 04/03/2015   Procedure: Coronary Stent Intervention;  Surgeon: Sherren Mocha, MD; 3.0x18 mm Xience DES to the CFX    . CARDIAC CATHETERIZATION N/A 08/02/2015   Procedure: Left Heart Cath and Coronary Angiography;  Surgeon: Troy Sine, MD;  Location: [facility];  Service: Cardiovascular;  Laterality: N/A;  . CARDIAC CATHETERIZATION N/A 08/02/2015   Procedure: Coronary Stent Intervention;  Surgeon: Troy Sine, MD;  Location: [facility];  Service: Cardiovascular;  Laterality: N/A;  . CARDIAC CATHETERIZATION N/A 06/25/2016   Procedure: Pericardiocentesis;  Surgeon: Will Meredith Leeds, MD;  Location: [facility];  Service: Cardiovascular;  Laterality: N/A;  . cardiac stents    . CHOLECYSTECTOMY OPEN  1990's  . COLONOSCOPY  2005   Dr. Laural Golden: pancolonic divericula, polyp, path unknown currently  . COLONOSCOPY  2012   Dr. Oneida Alar: Normal TI, scattered diverticula in entire colon, small internal hemorrhoids, normal colon biopsies. Colonoscopy in 5-10 years.   . COLOSTOMY  05/1979  . COLOSTOMY REVERSAL  11/1979  . EP IMPLANTABLE DEVICE N/A 06/25/2016   Procedure: Lead Revision/Repair;  Surgeon: Will Meredith Leeds, MD;  Location: [facility];  Service: Cardiovascular;  Laterality: N/A;  . EP IMPLANTABLE DEVICE N/A 06/25/2016   Procedure: Pacemaker Implant;  Surgeon: Will Meredith Leeds, MD;  Location: [facility];  Service: Cardiovascular;  Laterality: N/A;  . EXCISIONAL HEMORRHOIDECTOMY  1970's  . EYE SURGERY Left 2000   "branch vein occlusion"  . EYE SURGERY Left ~ 2001   "smoothed out wrinkle"  . LEFT OOPHORECTOMY  05/1979   nicked bowel, peritonitis, colostomy; colostomy reversed 1981   . LOWER EXTREMITY ANGIOGRAM N/A 01/03/2014   Procedure:  LOWER EXTREMITY ANGIOGRAM;  Surgeon: Wellington Hampshire, MD;  Location: [facility];  Service: Cardiovascular;  Laterality: N/A;  . Nuclear med stress test  10/2011   Small area of mild ischemia inferoapically.  [name] PARTIAL HYSTERECTOMY  1970's   left ovaries, then ovaries removed later due tumors   . RIGHT OOPHORECTOMY  1970's    Current Outpatient Prescriptions  Medication Sig Dispense Refill  . acetaminophen (TYLENOL) 325 MG tablet Take 650 mg by mouth every 6 (six) hours as needed for headache.    [name] amiodarone (PACERONE) 200 MG tablet Take 1 tablet (200 mg total) by mouth daily. 30 tablet 6  . apixaban (ELIQUIS) 5 MG TABS tablet Take 1 tablet (5 mg total) by mouth 2 (two) times daily. 56 tablet 0  . atorvastatin (LIPITOR) 80 MG tablet Take 1 tablet (80 mg total) by mouth daily at 6 PM. 30 tablet 6  . cholecalciferol (VITAMIN D) 1000 UNITS tablet Take 1,000 Units by mouth at bedtime.     . clopidogrel (PLAVIX) 75 MG tablet Take 1 tablet (75 mg total) by mouth daily. 90 tablet 3  . diltiazem (CARDIZEM CD) 360 MG 24 hr capsule Take 1 capsule (360 mg total) by mouth daily. 30 capsule 11  . diphenhydramine-acetaminophen (TYLENOL PM EXTRA STRENGTH) 25-500 MG TABS tablet Take 1 tablet by mouth at bedtime as needed.    . furosemide (LASIX) 40 MG tablet Take 1 tablet (40 mg total) by mouth daily. 30 tablet 3  . hydrALAZINE (APRESOLINE) 10 MG tablet Take 1 tablet (10 mg total) by mouth 3 (three) times daily. 90 tablet 11  . insulin NPH-regular Human (NOVOLIN 70/30) (70-30) 100 UNIT/ML injection Inject 30 Units into the skin See admin instructions. Inject 30 units before breakfast and 30 units before supper  - only if pre-meal blood glucose is above 90 mg/dL.    [name] levothyroxine (SYNTHROID, LEVOTHROID) 150 MCG tablet Take 150 mcg by mouth daily before breakfast.    . losartan (COZAAR) 100 MG tablet TAKE ONE (1) TABLET BY MOUTH EVERY DAY 90 tablet 3  . magnesium oxide (MAG-OX) 400 MG tablet Take 400 mg by  mouth daily.    . metFORMIN (GLUCOPHAGE) 500 MG tablet Take 500 mg by mouth 2 (two) times daily with a meal.    . metoprolol (LOPRESSOR) 100 MG tablet Take 1 tablet (100 mg total) by mouth 2 (two) times daily. 60 tablet 3  . nitroGLYCERIN (NITROSTAT) 0.4 MG SL tablet Place 1 tablet (0.4 mg total) under the tongue every 5 (five) minutes as needed for chest pain. 25 tablet 12  . Omega-3 Fatty Acids (FISH OIL) 1200 MG CAPS Take 2 capsules (2,400 mg total) by mouth 2 (two) times daily. 120 each 3  . potassium chloride SA (KLOR-CON M20) 20 MEQ tablet Take 1 tablet (20 mEq total) by mouth daily. 90 tablet 3  . rOPINIRole (REQUIP) 0.5 MG tablet Take 0.5 mg by mouth at bedtime.   0  . sodium chloride (OCEAN) 0.65 % SOLN nasal spray Place 1 spray into both nostrils as needed for congestion. (Patient taking differently: Place 1 spray into both nostrils at bedtime as needed for congestion. ) 1 Bottle 0   No current facility-administered medications for this visit.     Allergies as of 09/01/2016 - Review Complete 09/01/2016  Allergen Reaction Noted  . Penicillins Hives   . Percocet [oxycodone-acetaminophen] Nausea And Vomiting 02/27/2012    Vitals: BP 130/65   Pulse 90   Resp 20   Ht 5\' 3"  (1.6 m)   Wt 145 lb (65.8 kg)  BMI 25.69 kg/m  Last Weight:  Wt Readings from Last 1 Encounters:  09/01/16 145 lb (65.8 kg)   QQP:YPPJ mass index is 25.69 kg/m.     Last Height:   Ht Readings from Last 1 Encounters:  09/01/16 5\' 3"  (1.6 m)    Physical exam:  General: The patient is awake, alert and appears not in acute distress. The patient is well groomed. Head: Normocephalic, atraumatic. Neck is supple. Mallampati 3  neck circumference: 15.25 . Nasal airflow congested,  TMJ click not  evident . Retrognathia is not seen.  Cardiovascular:  Regular rate and rhythm, without  murmurs or carotid bruit, and without distended neck veins. Respiratory: Lungs are clear to auscultation. Skin:  Without  evidence of edema, or rash Trunk: BMI is 27.   Neurologic exam : The patient is awake and alert, oriented to place and time.    Attention span & concentration ability appears normal.  Speech is fluent,  without  dysarthria, dysphonia or aphasia.  Mood and affect are appropriate.  Cranial nerves: Pupils are equal and briskly reactive to light. Funduscopic exam without  evidence of pallor or edema. Amblyopia left eye, branch vein occlusion. ( eye stroke )  Hearing to finger rub intact.   Facial sensation intact to fine touch.  Facial motor strength is symmetric and tongue and uvula move midline. Shoulder shrug was symmetrical.   Motor exam:  Normal tone, muscle bulk and symmetric strength in all extremities. Right hand grip strength is stronger than left, bilateral equal strength, no cogwheeling and no rigor Sensory:  Fine touch, pinprick and vibration were reduced below the knee level, loss of primary modalities at the ankle level. Patient reports she has peripheral arterial peripheral vascular disease Coordination: Rapid alternating movements in the fingers/hands was normal. Finger-to-nose maneuver  normal without evidence of ataxia, dysmetria or tremor. Gait and station: Patient walks without assistive device and is able unassisted to climb up to the exam table. Strength within normal limits.  Stance is stable and normal.   Deep tendon reflexes: in the  upper and lower extremities are symmetric and intact.   The patient was advised of the nature of the diagnosed disorder , the treatment options and risks for general a health and wellness arising from not treating the condition.  I spent more than 25   minutes of face to face time with the patient. Greater than 50% of time was spent in counseling and coordination of care. We have discussed the diagnosis and differential and I answered the patient's questions.     Assessment:  After physical and neurologic examination, review of laboratory  studies,  Personal review of imaging studies, reports of other /same  Imaging studies ,  Results of polysomnography/ neurophysiology testing and pre-existing records as far as provided in visit., my assessment is   1)  patient has peripheral arterial disease, ankle edema,  atrial fibrillation, paroxysmal atrial fibrillation with high embolism risk, is on chronic anticoagulation, with a history of 5 admissions in 12 months for atrial fibrillation related to congestive heart failure, in addition history of TIA and stroke, retinal vein occlusion, coronary artery disease and myocardial infarction.  2) OSA treated at 10 cm water pressure CPAP with FFM , [employer] SIMPLUS in small size. She reports not having tried a nasal pillow or nasal interface before this  Mask was issued.     Plan:  Treatment plan and additional workup :I would like for her to continue using the  CPAP at the settings I would like to follow with her for 15 minute visit in 6 months and from thereon yearly.   She is allowed to try different interfaces with her DME.    Asencion Partridge Briannah Lona MD  09/01/2016   CC: Sharilyn Sites, [place], [place] [address]

## 2016-09-01 NOTE — Patient Instructions (Signed)

## 2016-09-08 ENCOUNTER — Encounter: Payer: Self-pay | Admitting: Cardiovascular Disease

## 2016-09-08 ENCOUNTER — Ambulatory Visit (INDEPENDENT_AMBULATORY_CARE_PROVIDER_SITE_OTHER): Payer: PPO | Admitting: Cardiovascular Disease

## 2016-09-08 ENCOUNTER — Other Ambulatory Visit: Payer: Self-pay

## 2016-09-08 VITALS — BP 146/62 | HR 65 | Ht 63.0 in | Wt 143.0 lb

## 2016-09-08 DIAGNOSIS — I4891 Unspecified atrial fibrillation: Secondary | ICD-10-CM

## 2016-09-08 DIAGNOSIS — I313 Pericardial effusion (noninflammatory): Secondary | ICD-10-CM

## 2016-09-08 DIAGNOSIS — E785 Hyperlipidemia, unspecified: Secondary | ICD-10-CM

## 2016-09-08 DIAGNOSIS — R5383 Other fatigue: Secondary | ICD-10-CM

## 2016-09-08 DIAGNOSIS — I3139 Other pericardial effusion (noninflammatory): Secondary | ICD-10-CM

## 2016-09-08 DIAGNOSIS — I10 Essential (primary) hypertension: Secondary | ICD-10-CM | POA: Diagnosis not present

## 2016-09-08 DIAGNOSIS — Z950 Presence of cardiac pacemaker: Secondary | ICD-10-CM

## 2016-09-08 DIAGNOSIS — I25118 Atherosclerotic heart disease of native coronary artery with other forms of angina pectoris: Secondary | ICD-10-CM

## 2016-09-08 DIAGNOSIS — G4733 Obstructive sleep apnea (adult) (pediatric): Secondary | ICD-10-CM | POA: Diagnosis not present

## 2016-09-08 MED ORDER — GLUCOSE BLOOD VI STRP: ORAL_STRIP | 5 refills | Status: DC

## 2016-09-08 MED ORDER — METOPROLOL TARTRATE 50 MG PO TABS: 50.0000 mg | ORAL_TABLET | Freq: Two times a day (BID) | ORAL | 3 refills | Status: DC

## 2016-09-08 MED ORDER — HYDRALAZINE HCL 25 MG PO TABS: 25.0000 mg | ORAL_TABLET | Freq: Three times a day (TID) | ORAL | 3 refills | Status: DC

## 2016-09-08 NOTE — Progress Notes (Signed)
SUBJECTIVE: The patient presents for routine follow-up. She has coronary artery disease, atrial fibrillation, and chronic diastolic heart failure. She underwent pacemaker placement for symptomatic bradycardia in January 2018. This led to pericardial tamponade and she underwent pericardiocentesis with removal of 450 mL of bloody fluid.  Echocardiogram 08/03/16 demonstrated normal left ventricular systolic function, LVEF 70-01%, ischemic wall motion abnormalities, grade 2 diastolic dysfunction with elevated filling pressures, and a trivial pericardial effusion.  She has occasional chest tightness associated with palpitations. She has not had to use nitroglycerin. She had been markedly short of breath on higher doses of amiodarone but this has significantly resolved. Overall, she feels weak and tired and wonders if she can come off some of her medications. She has occasional headaches. She has recorded her blood pressure daily with systolic readings as high as 182 mmHg.   Review of Systems: As per "subjective", otherwise negative.  Allergies  Allergen Reactions  . Penicillins Hives    Has patient had a PCN reaction causing immediate rash, facial/tongue/throat swelling, SOB or lightheadedness with hypotension: Yes Has patient had a PCN reaction causing severe rash involving mucus membranes or skin necrosis: No Has patient had a PCN reaction that required hospitalization No Has patient had a PCN reaction occurring within the last 10 years: No If all of the above answers are "NO", then may proceed with Cephalosporin use.  [name] Percocet [Oxycodone-Acetaminophen] Nausea And Vomiting    Current Outpatient Prescriptions  Medication Sig Dispense Refill  . acetaminophen (TYLENOL) 325 MG tablet Take 650 mg by mouth every 6 (six) hours as needed for headache.    [name] amiodarone (PACERONE) 200 MG tablet Take 1 tablet (200 mg total) by mouth daily. 30 tablet 6  . apixaban (ELIQUIS) 5 MG TABS tablet Take  1 tablet (5 mg total) by mouth 2 (two) times daily. 56 tablet 0  . atorvastatin (LIPITOR) 80 MG tablet Take 1 tablet (80 mg total) by mouth daily at 6 PM. 30 tablet 6  . cholecalciferol (VITAMIN D) 1000 UNITS tablet Take 1,000 Units by mouth at bedtime.     . clopidogrel (PLAVIX) 75 MG tablet Take 1 tablet (75 mg total) by mouth daily. 90 tablet 3  . diltiazem (CARDIZEM CD) 360 MG 24 hr capsule Take 1 capsule (360 mg total) by mouth daily. 30 capsule 11  . diphenhydramine-acetaminophen (TYLENOL PM EXTRA STRENGTH) 25-500 MG TABS tablet Take 1 tablet by mouth at bedtime as needed.    . furosemide (LASIX) 40 MG tablet Take 1 tablet (40 mg total) by mouth daily. 30 tablet 3  . hydrALAZINE (APRESOLINE) 10 MG tablet Take 1 tablet (10 mg total) by mouth 3 (three) times daily. 90 tablet 11  . insulin NPH-regular Human (NOVOLIN 70/30) (70-30) 100 UNIT/ML injection Inject 30 Units into the skin See admin instructions. Inject 30 units before breakfast and 30 units before supper  - only if pre-meal blood glucose is above 90 mg/dL.    [name] levothyroxine (SYNTHROID, LEVOTHROID) 150 MCG tablet Take 150 mcg by mouth daily before breakfast.    . losartan (COZAAR) 100 MG tablet TAKE ONE (1) TABLET BY MOUTH EVERY DAY 90 tablet 3  . magnesium oxide (MAG-OX) 400 MG tablet Take 400 mg by mouth daily.    . metFORMIN (GLUCOPHAGE) 500 MG tablet Take 500 mg by mouth 2 (two) times daily with a meal.    . metoprolol (LOPRESSOR) 100 MG tablet Take 1 tablet (100 mg total) by mouth 2 (two) times  daily. 60 tablet 3  . nitroGLYCERIN (NITROSTAT) 0.4 MG SL tablet Place 1 tablet (0.4 mg total) under the tongue every 5 (five) minutes as needed for chest pain. 25 tablet 12  . Omega-3 Fatty Acids (FISH OIL) 1200 MG CAPS Take 2 capsules (2,400 mg total) by mouth 2 (two) times daily. 120 each 3  . potassium chloride SA (KLOR-CON M20) 20 MEQ tablet Take 1 tablet (20 mEq total) by mouth daily. 90 tablet 3  . rOPINIRole (REQUIP) 0.5 MG tablet  Take 0.5 mg by mouth at bedtime.   0  . sodium chloride (OCEAN) 0.65 % SOLN nasal spray Place 1 spray into both nostrils as needed for congestion. (Patient taking differently: Place 1 spray into both nostrils at bedtime as needed for congestion. ) 1 Bottle 0   No current facility-administered medications for this visit.     Past Medical History:  Diagnosis Date  . Arthritis   . Atrial fibrillation and flutter ([place])    a. h/o PAF/flutter during admission in 2013 for PNA. b. PAF during adm for NSTEMI 07/2015, subsequent paroxysms since then.  . B12 deficiency anemia   . Cardiac tamponade 06/2016  . Coronary artery disease 11/30/2014   a. remote MI. b. h/o PTCA with scoring balloon to OM1 11/2014. c. NSTEMI 03/2015 s/p DES to prox-mid Cx. d. NSTEMI 07/2015 s/p scoring balloon/PTCA/DES to dRCA with PAF during that admission  . Cutaneous lupus erythematosus   . GERD (gastroesophageal reflux disease)   . Heart block   . History of blood transfusion 1980's   2nd surgical procedures  . HTN (hypertension)   . Hypercholesteremia   . Hypothyroidism   . Myocardial infarction 02/2012  . Ovarian tumor   . PAD (peripheral artery disease) ([place])    a. s/p LE angio 2015; followed by Dr. Fletcher Anon - managed medically.  . Pain with urination 05/08/2015  . Paroxysmal atrial flutter ([place])   . Pericardial effusion    a. 06/2016 after ppm - s/p pericardiocentesis.  . Superficial fungus infection of skin 06/29/2013  . Tachy-brady syndrome ([employer])    a. s/p Medtronic PPM 06/2016, c/b lead perf/pericardial effusion.  [name] TIA (transient ischemic attack) 08/2001; ~ 2006  . Type II diabetes mellitus ([place])   . UTI (urinary tract infection) 05/08/2013    Past Surgical History:  Procedure Laterality Date  . ABDOMINAL AORTAGRAM N/A 01/03/2014   Procedure: ABDOMINAL [name];  Surgeon: Wellington Hampshire, MD;  Location: [facility];  Service: Cardiovascular;  Laterality: N/A;  . ABDOMINAL HYSTERECTOMY  1972   "partial"  .  APPENDECTOMY  1970's  . CARDIAC CATHETERIZATION  2008   Tiny OM-2 with 90% narrowing. Med tx.  [name] CARDIAC CATHETERIZATION N/A 11/30/2014   Procedure: Left Heart Cath and Coronary Angiography;  Surgeon: Troy Sine, MD; LAD 20%, CFX 50%, OM1 95%, right PLB 30%, LV normal   . CARDIAC CATHETERIZATION N/A 11/30/2014   Procedure: Coronary Balloon Angioplasty;  Surgeon: Troy Sine, MD;  Angiosculpt scoring balloon and PTCA to the OM1 reducing stenosis from 95% to less than 10%  . CARDIAC CATHETERIZATION N/A 04/03/2015   Procedure: Left Heart Cath and Coronary Angiography;  Surgeon: Jolaine Artist, MD; dLAD 50%, CFX 90%, OM1 100%, PLA 15%, LVEDP 13    . CARDIAC CATHETERIZATION N/A 04/03/2015   Procedure: Coronary Stent Intervention;  Surgeon: Sherren Mocha, MD; 3.0x18 mm Xience DES to the CFX    . CARDIAC CATHETERIZATION N/A 08/02/2015   Procedure: Left Heart Cath  and Coronary Angiography;  Surgeon: Troy Sine, MD;  Location: [facility];  Service: Cardiovascular;  Laterality: N/A;  . CARDIAC CATHETERIZATION N/A 08/02/2015   Procedure: Coronary Stent Intervention;  Surgeon: Troy Sine, MD;  Location: [facility];  Service: Cardiovascular;  Laterality: N/A;  . CARDIAC CATHETERIZATION N/A 06/25/2016   Procedure: Pericardiocentesis;  Surgeon: Will Meredith Leeds, MD;  Location: [facility];  Service: Cardiovascular;  Laterality: N/A;  . cardiac stents    . CHOLECYSTECTOMY OPEN  1990's  . COLONOSCOPY  2005   Dr. Laural Golden: pancolonic divericula, polyp, path unknown currently  . COLONOSCOPY  2012   Dr. Oneida Alar: Normal TI, scattered diverticula in entire colon, small internal hemorrhoids, normal colon biopsies. Colonoscopy in 5-10 years.   . COLOSTOMY  05/1979  . COLOSTOMY REVERSAL  11/1979  . EP IMPLANTABLE DEVICE N/A 06/25/2016   Procedure: Lead Revision/Repair;  Surgeon: Will Meredith Leeds, MD;  Location: [facility];  Service: Cardiovascular;  Laterality: N/A;  .  EP IMPLANTABLE DEVICE N/A 06/25/2016   Procedure: Pacemaker Implant;  Surgeon: Will Meredith Leeds, MD;  Location: [facility];  Service: Cardiovascular;  Laterality: N/A;  . EXCISIONAL HEMORRHOIDECTOMY  1970's  . EYE SURGERY Left 2000   "branch vein occlusion"  . EYE SURGERY Left ~ 2001   "smoothed out wrinkle"  . LEFT OOPHORECTOMY  05/1979   nicked bowel, peritonitis, colostomy; colostomy reversed 1981   . LOWER EXTREMITY ANGIOGRAM N/A 01/03/2014   Procedure: LOWER EXTREMITY ANGIOGRAM;  Surgeon: Wellington Hampshire, MD;  Location: [facility];  Service: Cardiovascular;  Laterality: N/A;  . Nuclear med stress test  10/2011   Small area of mild ischemia inferoapically.  [name] PARTIAL HYSTERECTOMY  1970's   left ovaries, then ovaries removed later due tumors   . RIGHT OOPHORECTOMY  1970's    Social History   Social History  . Marital status: Married    Spouse name: N/A  . Number of children: N/A  . Years of education: N/A   Occupational History  . Retired Retired    Research officer, political party   Social History Main Topics  . Smoking status: Never Smoker  . Smokeless tobacco: Never Used     Comment: Never smoked  . Alcohol use No  . Drug use: No  . Sexual activity: No     Comment: hyst   Other Topics Concern  . Not on file   Social History Narrative  . No narrative on file     Vitals:   09/08/16 1028  BP: (!) 146/62  Pulse: 65  SpO2: 98%  Weight: 143 lb (64.9 kg)  Height: 5\' 3"  (1.6 m)    PHYSICAL EXAM General: NAD HEENT: Normal. Neck: No JVD, no thyromegaly. Lungs: Clear to auscultation bilaterally with normal respiratory effort. CV: Nondisplaced PMI.  Regular rate and rhythm, normal S1/S2, no S3/S4, no murmur. No pretibial or periankle edema.  No carotid bruit.   Abdomen: Soft, nontender, no distention.  Neurologic: Alert and oriented.  Psych: Normal affect. Skin: Normal. Musculoskeletal: No gross deformities.    ECG: Most recent ECG  reviewed.      ASSESSMENT AND PLAN:  1. CAD with prior PCI's: Symptomatically stable. Continue Lipitor, metoprolol (will reduce dose to 50 mg bid due to complaints of fatigue), and Plavix.  2. Essential HTN: Elevated. Increase hydralazine to 25 mg tid. I have asked the patient to check blood pressure readings 4-5 times per week, at different times  throughout the day, in order to get a better approximation of mean BP values. These results will be provided to me at the end of that period so that I can determine if antihypertensive medication titration is indicated.  3. Hyperlipidemia: Lipids from 04/14/16 reviewed (TC 116, TG 439, HDL 24, unable to calculate LDL) . Continue Lipitor 80 mg daily. Also takes fish oil.  4. Chronic diastolic heart failure: Euvolemic. No changes.Continue Lasix 40 mg daily.  5. Atrial fibrillation: Currently in sinus rhythm. Continue amiodarone and Eliquis as well as long-acting diltiazem.  6. Pacemaker: Normal function.  7. Pericardial effusion: Resolved. Echo reviewed above.  8. Fatigue: Will reduce metoprolol from 100 mg twice daily to 50 mg twice daily to see if this improves her symptoms.   Dispo: fu 3 months.   Kate Sable, M.D., F.A.C.C.

## 2016-09-08 NOTE — Patient Instructions (Signed)
Your physician recommends that you schedule a follow-up appointment in: 3 months with Dr Bronson Ing    INCREASE Hydralazine to 25 mg three times a day    DECREASE Metoprolol to 50 mg twice a day       Thank you for choosing [facility] !

## 2016-09-09 DIAGNOSIS — Z1389 Encounter for screening for other disorder: Secondary | ICD-10-CM | POA: Diagnosis not present

## 2016-09-09 DIAGNOSIS — I213 ST elevation (STEMI) myocardial infarction of unspecified site: Secondary | ICD-10-CM | POA: Diagnosis not present

## 2016-09-09 DIAGNOSIS — I4891 Unspecified atrial fibrillation: Secondary | ICD-10-CM | POA: Diagnosis not present

## 2016-09-09 DIAGNOSIS — E782 Mixed hyperlipidemia: Secondary | ICD-10-CM | POA: Diagnosis not present

## 2016-09-09 DIAGNOSIS — Z6826 Body mass index (BMI) 26.0-26.9, adult: Secondary | ICD-10-CM | POA: Diagnosis not present

## 2016-09-09 DIAGNOSIS — M1612 Unilateral primary osteoarthritis, left hip: Secondary | ICD-10-CM | POA: Diagnosis not present

## 2016-09-09 DIAGNOSIS — E539 Vitamin B deficiency, unspecified: Secondary | ICD-10-CM | POA: Diagnosis not present

## 2016-09-09 DIAGNOSIS — I10 Essential (primary) hypertension: Secondary | ICD-10-CM | POA: Diagnosis not present

## 2016-09-09 DIAGNOSIS — Z0001 Encounter for general adult medical examination with abnormal findings: Secondary | ICD-10-CM | POA: Diagnosis not present

## 2016-09-09 DIAGNOSIS — E039 Hypothyroidism, unspecified: Secondary | ICD-10-CM | POA: Diagnosis not present

## 2016-09-09 DIAGNOSIS — I2510 Atherosclerotic heart disease of native coronary artery without angina pectoris: Secondary | ICD-10-CM | POA: Diagnosis not present

## 2016-09-09 DIAGNOSIS — E119 Type 2 diabetes mellitus without complications: Secondary | ICD-10-CM | POA: Diagnosis not present

## 2016-09-10 ENCOUNTER — Ambulatory Visit: Payer: PPO | Admitting: Cardiovascular Disease

## 2016-09-11 DIAGNOSIS — E539 Vitamin B deficiency, unspecified: Secondary | ICD-10-CM | POA: Diagnosis not present

## 2016-09-14 DIAGNOSIS — E113311 Type 2 diabetes mellitus with moderate nonproliferative diabetic retinopathy with macular edema, right eye: Secondary | ICD-10-CM | POA: Diagnosis not present

## 2016-09-14 DIAGNOSIS — H35033 Hypertensive retinopathy, bilateral: Secondary | ICD-10-CM | POA: Diagnosis not present

## 2016-09-14 DIAGNOSIS — H43811 Vitreous degeneration, right eye: Secondary | ICD-10-CM | POA: Diagnosis not present

## 2016-09-14 DIAGNOSIS — H348322 Tributary (branch) retinal vein occlusion, left eye, stable: Secondary | ICD-10-CM | POA: Diagnosis not present

## 2016-09-15 ENCOUNTER — Other Ambulatory Visit: Payer: Self-pay | Admitting: Cardiovascular Disease

## 2016-09-17 ENCOUNTER — Telehealth: Payer: Self-pay | Admitting: Cardiovascular Disease

## 2016-09-17 ENCOUNTER — Other Ambulatory Visit: Payer: Self-pay | Admitting: "Endocrinology

## 2016-09-17 NOTE — Telephone Encounter (Signed)
Patient would like to speak with nurse regarding BP. / tg

## 2016-09-18 MED ORDER — HYDRALAZINE HCL 50 MG PO TABS: 50.0000 mg | ORAL_TABLET | Freq: Three times a day (TID) | ORAL | 3 refills | Status: DC

## 2016-09-18 NOTE — Telephone Encounter (Signed)
Pt made aware, new script entered,will call with BP readings

## 2016-09-18 NOTE — Telephone Encounter (Signed)
Please increase to 50 mg tid.

## 2016-09-18 NOTE — Telephone Encounter (Signed)
Patient says even with increase with hydralazine and metoprolol she still has BP high.Opthomologist says she have fluid in right eye and it could HTN or DM.BP 176/96, 175/85   * I need to call cell*

## 2016-09-19 DIAGNOSIS — G4733 Obstructive sleep apnea (adult) (pediatric): Secondary | ICD-10-CM | POA: Diagnosis not present

## 2016-09-21 ENCOUNTER — Encounter (HOSPITAL_COMMUNITY): Payer: Self-pay | Admitting: Anesthesiology

## 2016-09-21 DIAGNOSIS — E113311 Type 2 diabetes mellitus with moderate nonproliferative diabetic retinopathy with macular edema, right eye: Secondary | ICD-10-CM | POA: Diagnosis not present

## 2016-09-22 ENCOUNTER — Other Ambulatory Visit: Payer: Self-pay

## 2016-09-22 MED ORDER — GLUCOSE BLOOD VI STRP: ORAL_STRIP | 3 refills | Status: DC

## 2016-09-25 ENCOUNTER — Ambulatory Visit (INDEPENDENT_AMBULATORY_CARE_PROVIDER_SITE_OTHER): Payer: PPO | Admitting: Cardiology

## 2016-09-25 ENCOUNTER — Encounter: Payer: Self-pay | Admitting: Cardiology

## 2016-09-25 VITALS — BP 148/78 | HR 78 | Ht 63.0 in | Wt 148.2 lb

## 2016-09-25 DIAGNOSIS — I480 Paroxysmal atrial fibrillation: Secondary | ICD-10-CM

## 2016-09-25 DIAGNOSIS — Z45018 Encounter for adjustment and management of other part of cardiac pacemaker: Secondary | ICD-10-CM

## 2016-09-25 DIAGNOSIS — I495 Sick sinus syndrome: Secondary | ICD-10-CM

## 2016-09-25 DIAGNOSIS — Z79899 Other long term (current) drug therapy: Secondary | ICD-10-CM | POA: Diagnosis not present

## 2016-09-25 LAB — CUP PACEART INCLINIC DEVICE CHECK
Brady Statistic AP VS Percent: 96.25 %
Brady Statistic AS VP Percent: 0.55 %
Brady Statistic RA Percent Paced: 94.82 %
Date Time Interrogation Session: 20180406121957
Implantable Lead Implant Date: 20180104
Implantable Lead Implant Date: 20180104
Implantable Lead Location: 753860
Lead Channel Impedance Value: 342 Ohm
Lead Channel Impedance Value: 475 Ohm
Lead Channel Pacing Threshold Amplitude: 0.5 V
Lead Channel Pacing Threshold Pulse Width: 0.4 ms
Lead Channel Pacing Threshold Pulse Width: 0.4 ms
Lead Channel Setting Pacing Amplitude: 2 V
Lead Channel Setting Sensing Sensitivity: 2.8 mV
MDC IDC LEAD LOCATION: 753859
MDC IDC MSMT BATTERY REMAINING LONGEVITY: 119 mo
MDC IDC MSMT BATTERY VOLTAGE: 3.05 V
MDC IDC MSMT LEADCHNL RA IMPEDANCE VALUE: 342 Ohm
MDC IDC MSMT LEADCHNL RA SENSING INTR AMPL: 1.125 mV
MDC IDC MSMT LEADCHNL RV IMPEDANCE VALUE: 437 Ohm
MDC IDC MSMT LEADCHNL RV PACING THRESHOLD AMPLITUDE: 1.25 V
MDC IDC MSMT LEADCHNL RV SENSING INTR AMPL: 12.25 mV
MDC IDC PG IMPLANT DT: 20180104
MDC IDC SET LEADCHNL RV PACING AMPLITUDE: 3 V
MDC IDC SET LEADCHNL RV PACING PULSEWIDTH: 0.4 ms
MDC IDC STAT BRADY AP VP PERCENT: 0.65 %
MDC IDC STAT BRADY AS VS PERCENT: 2.56 %
MDC IDC STAT BRADY RV PERCENT PACED: 1.46 %

## 2016-09-25 MED ORDER — CARVEDILOL 12.5 MG PO TABS: 12.5000 mg | ORAL_TABLET | Freq: Two times a day (BID) | ORAL | 3 refills | Status: DC

## 2016-09-25 NOTE — Progress Notes (Signed)
Electrophysiology Office Note   Date:  09/25/2016   ID:  Virginia Huber, DOB [birth year], MRN [id number]  PCP:  Purvis Kilts, MD  Cardiologist:  Bronson Ing Primary Electrophysiologist:  Constantina Laseter Meredith Leeds, MD    Chief Complaint  Huber presents with  . Pacemaker Check     History of Present Illness: [place] Huber is a 71 y.o. female who presents Huber for electrophysiology evaluation.   [place]ia has a history of CAD status post MI and PCI of [place] OM1, with an an STEMI 03/2015 status post DES to [place] circumflex, an STEMI 07/2015 with PCI to [place] RCA and paroxysmal atrial fibrillation during that admission. Virginia is on Eliquis. Sleep study showed mild sleep apnea. [place] Huber on 12/17 and was found to be in A. fib with RVR. [place] converted to sinus rhythm after arrival. [place] was readmitted to [place] Huber on 12/23 and was noted to be in atrial flutter with rapid rates at that time. [place] had junctional rhythm and near-syncope after conversion to sinus rhythm. [place] had a postconversion pause of 5.6 seconds. Pacemaker placed 06/25/16 with lead revision 06/26/16. Device implant was complicated by pericardial effusion and tamponade requiring emergent pericardiocentesis.  [place]ia Huber with heart rate and blood pressure monitors for Virginia last 2 weeks. [place] Huber blood pressure is been in [place] 170s to 180s down as low as [place] 932T systolic. [place] Huber heart rate has been well-controlled in [place] 60s to 80s.  Huber, [place] denies symptoms of palpitations, chest pain, shortness of breath, orthopnea, PND, lower extremity edema, claudication, dizziness, presyncope, syncope, bleeding, or neurologic sequela. [place] Huber is tolerating medications without difficulties and is otherwise without complaint Huber.    Past Medical History:  Diagnosis Date  . Arthritis   . Atrial fibrillation and flutter ([place])    a. h/o PAF/flutter during admission in 2013 for PNA. b. PAF during adm for NSTEMI 07/2015, subsequent paroxysms  since then.  . B12 deficiency anemia   . Cardiac tamponade 06/2016  . Coronary artery disease 11/30/2014   a. remote MI. b. h/o PTCA with scoring balloon to OM1 11/2014. c. NSTEMI 03/2015 s/p DES to prox-mid Cx. d. NSTEMI 07/2015 s/p scoring balloon/PTCA/DES to dRCA with PAF during that admission  . Cutaneous lupus erythematosus   . GERD (gastroesophageal reflux disease)   . Heart block   . History of blood transfusion 1980's   2nd surgical procedures  . HTN (hypertension)   . Hypercholesteremia   . Hypothyroidism   . Myocardial infarction 02/2012  . Ovarian tumor   . PAD (peripheral artery disease) ([place])    a. s/p LE angio 2015; followed by Dr. Fletcher Anon - managed medically.  . Pain with urination 05/08/2015  . Paroxysmal atrial flutter ([place])   . Pericardial effusion    a. 06/2016 after ppm - s/p pericardiocentesis.  . Superficial fungus infection of skin 06/29/2013  . Tachy-brady syndrome ([place])    a. s/p Medtronic PPM 06/2016, c/b lead perf/pericardial effusion.  [name] TIA (transient ischemic attack) 08/2001; ~ 2006  . Type II diabetes mellitus ([place])   . UTI (urinary tract infection) 05/08/2013   Past Surgical History:  Procedure Laterality Date  . ABDOMINAL AORTAGRAM N/A 01/03/2014   Procedure: ABDOMINAL [name];  Surgeon: Wellington Hampshire, MD;  Location: [place];  Service: Cardiovascular;  Laterality: N/A;  . ABDOMINAL HYSTERECTOMY  1972   "partial"  . APPENDECTOMY  1970's  . CARDIAC CATHETERIZATION  2008   Tiny OM-2 with  90% narrowing. Med tx.  [name] CARDIAC CATHETERIZATION N/A 11/30/2014   Procedure: Left Heart Cath and Coronary Angiography;  Surgeon: Troy Sine, MD; LAD 20%, CFX 50%, OM1 95%, right PLB 30%, LV normal   . CARDIAC CATHETERIZATION N/A 11/30/2014   Procedure: Coronary Balloon Angioplasty;  Surgeon: Troy Sine, MD;  Angiosculpt scoring balloon and PTCA to [place] OM1 reducing stenosis from 95% to less than 10%  . CARDIAC CATHETERIZATION N/A 04/03/2015   Procedure: Left  Heart Cath and Coronary Angiography;  Surgeon: Jolaine Artist, MD; dLAD 50%, CFX 90%, OM1 100%, PLA 15%, LVEDP 13    . CARDIAC CATHETERIZATION N/A 04/03/2015   Procedure: Coronary Stent Intervention;  Surgeon: Sherren Mocha, MD; 3.0x18 mm Xience DES to [place] CFX    . CARDIAC CATHETERIZATION N/A 08/02/2015   Procedure: Left Heart Cath and Coronary Angiography;  Surgeon: Troy Sine, MD;  Location: [facility];  Service: Cardiovascular;  Laterality: N/A;  . CARDIAC CATHETERIZATION N/A 08/02/2015   Procedure: Coronary Stent Intervention;  Surgeon: Troy Sine, MD;  Location: [facility];  Service: Cardiovascular;  Laterality: N/A;  . CARDIAC CATHETERIZATION N/A 06/25/2016   Procedure: Pericardiocentesis;  Surgeon: Gemini Bunte Meredith Leeds, MD;  Location: [facility];  Service: Cardiovascular;  Laterality: N/A;  . cardiac stents    . CHOLECYSTECTOMY OPEN  1990's  . COLONOSCOPY  2005   Dr. Laural Golden: pancolonic divericula, polyp, path unknown currently  . COLONOSCOPY  2012   Dr. Oneida Alar: Normal TI, scattered diverticula in entire colon, small internal hemorrhoids, normal colon biopsies. Colonoscopy in 5-10 years.   . COLOSTOMY  05/1979  . COLOSTOMY REVERSAL  11/1979  . EP IMPLANTABLE DEVICE N/A 06/25/2016   Procedure: Lead Revision/Repair;  Surgeon: Arjuna Doeden Meredith Leeds, MD;  Location: [facility];  Service: Cardiovascular;  Laterality: N/A;  . EP IMPLANTABLE DEVICE N/A 06/25/2016   Procedure: Pacemaker Implant;  Surgeon: Stein Windhorst Meredith Leeds, MD;  Location: [facility];  Service: Cardiovascular;  Laterality: N/A;  . EXCISIONAL HEMORRHOIDECTOMY  1970's  . EYE SURGERY Left 2000   "branch vein occlusion"  . EYE SURGERY Left ~ 2001   "smoothed out wrinkle"  . LEFT OOPHORECTOMY  05/1979   nicked bowel, peritonitis, colostomy; colostomy reversed 1981   . LOWER EXTREMITY ANGIOGRAM N/A 01/03/2014   Procedure: LOWER EXTREMITY ANGIOGRAM;  Surgeon: Wellington Hampshire, MD;   Location: [facility];  Service: Cardiovascular;  Laterality: N/A;  . Nuclear med stress test  10/2011   Small area of mild ischemia inferoapically.  [name] PARTIAL HYSTERECTOMY  1970's   left ovaries, then ovaries removed later due tumors   . RIGHT OOPHORECTOMY  1970's     Current Outpatient Prescriptions  Medication Sig Dispense Refill  . acetaminophen (TYLENOL) 325 MG tablet Take 650 mg by mouth every 6 (six) hours as needed for headache.    [name] amiodarone (PACERONE) 200 MG tablet Take 1 tablet (200 mg total) by mouth daily. 30 tablet 6  . apixaban (ELIQUIS) 5 MG TABS tablet Take 1 tablet (5 mg total) by mouth 2 (two) times daily. 56 tablet 0  . atorvastatin (LIPITOR) 80 MG tablet Take 1 tablet (80 mg total) by mouth daily at 6 PM. 30 tablet 6  . cholecalciferol (VITAMIN D) 1000 UNITS tablet Take 1,000 Units by mouth at bedtime.     . clopidogrel (PLAVIX) 75 MG tablet TAKE ONE (1) TABLET BY MOUTH EVERY DAY 90 tablet 3  . diltiazem (  CARDIZEM CD) 360 MG 24 hr capsule Take 1 capsule (360 mg total) by mouth daily. 30 capsule 11  . diphenhydramine-acetaminophen (TYLENOL PM EXTRA STRENGTH) 25-500 MG TABS tablet Take 1 tablet by mouth at bedtime as needed (Take as directed).     . furosemide (LASIX) 40 MG tablet Take 1 tablet (40 mg total) by mouth daily. 30 tablet 3  . glucose blood (FREESTYLE LITE) test strip As directed bid. E11.65 100 each 3  . hydrALAZINE (APRESOLINE) 50 MG tablet Take 100 mg by mouth 3 (three) times daily.    . insulin NPH-regular Human (NOVOLIN 70/30) (70-30) 100 UNIT/ML injection Inject 30 Units into [place] skin See admin instructions. Inject 30 units before breakfast and 30 units before supper  - only if pre-meal blood glucose is above 90 mg/dL.    [name] levothyroxine (SYNTHROID, LEVOTHROID) 150 MCG tablet Take 150 mcg by mouth daily before breakfast.    . losartan (COZAAR) 100 MG tablet TAKE ONE (1) TABLET BY MOUTH EVERY DAY 90 tablet 3  . magnesium oxide (MAG-OX) 400 MG tablet Take  400 mg by mouth daily.    . metFORMIN (GLUCOPHAGE) 500 MG tablet Take 500 mg by mouth 2 (two) times daily with a meal.    . metoprolol (LOPRESSOR) 50 MG tablet Take 1 tablet (50 mg total) by mouth 2 (two) times daily. 180 tablet 3  . nitroGLYCERIN (NITROSTAT) 0.4 MG SL tablet Place 1 tablet (0.4 mg total) under [place] tongue every 5 (five) minutes as needed for chest pain. 25 tablet 12  . Omega-3 Fatty Acids (FISH OIL) 1200 MG CAPS Take 2 capsules (2,400 mg total) by mouth 2 (two) times daily. 120 each 3  . potassium chloride SA (KLOR-CON M20) 20 MEQ tablet Take 1 tablet (20 mEq total) by mouth daily. 90 tablet 3  . rOPINIRole (REQUIP) 0.5 MG tablet Take 0.5 mg by mouth at bedtime.   0   No current facility-administered medications for this visit.     Allergies:   Penicillins and Percocet [oxycodone-acetaminophen]   Social History:  Virginia Huber  reports that Virginia has never smoked. Virginia has never used smokeless tobacco. Virginia reports that Virginia does not drink alcohol or use drugs.   Family History:  Virginia Huber's family history includes Diabetes in [place] Huber brother; Heart disease in [place] Huber brother, brother, father, mother, and sister; Lupus in [place] Huber daughter; Thyroid disease in [place] Huber brother and brother.    ROS:  Please see Virginia history of present illness.   Otherwise, review of systems is positive for palpitations, visual changes.   All other systems are reviewed and negative.    PHYSICAL EXAM: VS:  BP (!) 148/78   Pulse 78   Ht 5\' 3"  (1.6 m)   Wt 148 lb 3.2 oz (67.2 kg)   SpO2 97%   BMI 26.25 kg/m  , BMI Body mass index is 26.25 kg/m. GEN: Well nourished, well developed, in no acute distress  HEENT: normal  Neck: no JVD, carotid bruits, or masses Cardiac: RRR; no murmurs, rubs, or gallops,no edema  Respiratory:  clear to auscultation bilaterally, normal work of breathing GI: soft, nontender, nondistended, + BS MS: no deformity or atrophy  Skin: warm and dry Neuro:  Strength and sensation are  intact Psych: euthymic mood, full affect  EKG:  EKG is ordered Huber. Personal review of [place] ekg ordered shows A paced, 1 degree AV block, LVH, rate 78  Recent Labs: 03/13/2016: Magnesium 1.9 04/14/2016: TSH 2.36 06/07/2016: B Natriuretic Peptide  155.0 06/28/2016: Hemoglobin 8.6; Platelets 208 07/21/2016: ALT 8; BUN 23; Creat 1.31; Potassium 4.5; Sodium 141    Lipid Panel     Component Value Date/Time   CHOL 116 (L) 04/14/2016 0910   TRIG 439 (H) 04/14/2016 0910   HDL 24 (L) 04/14/2016 0910   CHOLHDL 4.8 04/14/2016 0910   VLDL NOT CALC 04/14/2016 0910   LDLCALC NOT CALC 04/14/2016 0910     Wt Readings from Last 3 Encounters:  09/25/16 148 lb 3.2 oz (67.2 kg)  09/08/16 143 lb (64.9 kg)  09/01/16 145 lb (65.8 kg)      Other studies Reviewed: Additional studies/ records that were reviewed Huber include: TTE 08/03/16, Cath  08/02/15 Review of [place] above records Huber demonstrates:  - Left ventricle: [place] cavity size was normal. Wall thickness was   increased in a pattern of severe LVH. Systolic function was   normal. [place] estimated ejection fraction was in [place] range of 55%   to 60%. Features are consistent with a pseudonormal left   ventricular filling pattern, with concomitant abnormal relaxation   and increased filling pressure (grade 2 diastolic dysfunction).   Doppler parameters are consistent with high ventricular filling   pressure. - Regional wall motion abnormality: Hypokinesis of [place] basal-mid   anteroseptal myocardium. - Aortic valve: Mildly calcified annulus. Trileaflet; mildly   thickened leaflets. Valve area (VTI): 1.93 cm^2. Valve area   (Vmax): 2.1 cm^2. - Mitral valve: Mildly calcified annulus. Mildly thickened leaflets   . There was mild regurgitation. - Left atrium: [place] atrium was mildly dilated. - Right ventricle: [place] cavity size was mildly dilated. - Atrial septum: No defect or patent foramen ovale was identified.   1st RPLB lesion, 50% stenosed.  Dist  RCA lesion, 30% stenosed.  Ost 1st Mrg to 1st Mrg lesion, 100% stenosed.  Ost LAD lesion, 40% stenosed.  Mid LAD lesion, 40% stenosed.  Post Atrio lesion, 90% stenosed. Post intervention, there is a 0% residual stenosis.  [place] left ventricular systolic function is normal.  Mid RCA lesion, 20% stenosed.   Normal LV function without focal segmental wall motion abnormalities and ejection fraction of 55%.  Significant coronary obstructive disease with diffuse mild luminal narrowing of [place] LAD with 40% proximal and mid stenoses; widely patent stent in [place] proximal circumflex with old occlusion of [place] marginal branch which had arisen in [place] region of [place] stented segment but with evidence for very faint collateralization to this distal marginal vessel from [place] LAD; a large dominant RCA with 20%, mild mid narrowing, 30% narrowing after [place] acute margin and focal 90% stenosis distally prior to a PDA 2 vessel with 50% narrowing at [place] ostium of this PDA prior to a bend in [place] vessel with [place] RCA ending in [place] PLA vessel.  Successful percutaneous cardiac intervention to [place] distal RCA treated with Angiosculpt scoring balloon, PTCA, and ultimate stenting with a 2.58 mm Xience Alpine DES stent postdilated to 2.51 mm with a 90% stenosis reduced to 0% and no change in [place] ostial PDA narrowing.  ASSESSMENT AND PLAN:  1.  Paroxysmal atrial fibrillation with tachybradycardia syndrome:  Pacemaker placed 06/25/16 with lead revision 06/26/16. Right functioning appropriately. No changes made Huber.  This patients CHA2DS2-VASc Score and unadjusted Ischemic Stroke Rate (% per year) is equal to 11.2 % stroke rate/year from a score of 7  Above score calculated as 1 point each if present [CHF, HTN, DM, Vascular=MI/PAD/Aortic Plaque, Age if 65-74, or Female] Above score calculated as  2 points each if present [Age > 75, or Stroke/TIA/TE]  2. Coronary artery disease: Currently no chest pain  3. Hypertension:  Blood pressure continues to be elevated. [place] has had some high issues and [place] Huber ophthalmologist is that Virginia Huber blood pressure needs to be much better controlled. Switch [place] Huber metoprolol to carvedilol 12.5 mg Huber. We Anabia Weatherwax call Virginia Huber back in 2 weeks to get a report of [place] Huber blood pressures and make further changes at that time.  Current medicines are reviewed at length with Virginia Huber Huber.   Virginia Huber does not have concerns regarding [place] Huber medicines.  [place] following changes were made Huber:  Metoprolol start carvedilol  Labs/ tests ordered Huber include:  Orders Placed This Encounter  Procedures  . EKG 12-Lead     Disposition:   FU with Aquanetta Schwarz 6 months  Signed, Ragnar Waas Meredith Leeds, MD  09/25/2016 11:47 AM     [facility] [address] [place] [address] [phone] (office) [phone] (fax)

## 2016-09-25 NOTE — Patient Instructions (Addendum)
Medication Instructions:    Your physician has recommended you make the following change in your medication:  1) STOP Metoprolol 2) START Carvedilol 12.5 mg twice a day  --- If you need a refill on your cardiac medications before your next appointment, please call your pharmacy. ---  Labwork:  TSH today  Testing/Procedures:  None ordered  Follow-Up: Remote monitoring is used to monitor your Pacemaker of ICD from home. This monitoring reduces the number of office visits required to check your device to one time per year. It allows [place] to keep an eye on the functioning of your device to ensure it is working properly. You are scheduled for a device check from home on 12/28/2016. You may send your transmission at any time that day. If you have a wireless device, the transmission will be sent automatically. After your physician reviews your transmission, you will receive a postcard with your next transmission date.   Your physician wants you to follow-up in: 6 months with Dr. Curt Bears.  You will receive a reminder letter in the mail two months in advance. If you don't receive a letter, please call our office to schedule the follow-up appointment.  Thank you for choosing [facility]!!   Trinidad Curet, RN [phone]  Any Other Special Instructions Will Be Listed Below (If Applicable). Please keep track of your blood pressure over the next several weeks.  Please call Jamirra Curnow, RN in several weeks to report how your blood pressure is doing.  Carvedilol tablets What is this medicine? CARVEDILOL (KAR ve dil ol) is a beta-blocker. Beta-blockers reduce the workload on the heart and help it to beat more regularly. This medicine is used to treat high blood pressure and heart failure. This medicine may be used for other purposes; ask your health care provider or pharmacist if you have questions. COMMON BRAND NAME(S): Coreg What should I tell my health care provider before I take this  medicine? They need to know if you have any of these conditions: -circulation problems -diabetes -history of heart attack or heart disease -liver disease -lung or breathing disease, like asthma or emphysema -pheochromocytoma -slow or irregular heartbeat -thyroid disease -an unusual or allergic reaction to carvedilol, other beta-blockers, medicines, foods, dyes, or preservatives -pregnant or trying to get pregnant -breast-feeding How should I use this medicine? Take this medicine by mouth with a glass of water. Follow the directions on the prescription label. It is best to take the tablets with food. Take your doses at regular intervals. Do not take your medicine more often than directed. Do not stop taking except on the advice of your doctor or health care professional. Talk to your pediatrician regarding the use of this medicine in children. Special care may be needed. Overdosage: If you think you have taken too much of this medicine contact a poison control center or emergency room at once. NOTE: This medicine is only for you. Do not share this medicine with others. What if I miss a dose? If you miss a dose, take it as soon as you can. If it is almost time for your next dose, take only that dose. Do not take double or extra doses. What may interact with this medicine? This medicine may interact with the following medications: -certain medicines for blood pressure, heart disease, irregular heart beat -certain medicines for depression, like fluoxetine or paroxetine -certain medicines for diabetes, like glipizide or glyburide -cimetidine -clonidine -cyclosporine -digoxin -MAOIs like Carbex, Eldepryl, Marplan, Nardil, and Parnate -reserpine -rifampin This  list may not describe all possible interactions. Give your health care provider a list of all the medicines, herbs, non-prescription drugs, or dietary supplements you use. Also tell them if you smoke, drink alcohol, or use illegal  drugs. Some items may interact with your medicine. What should I watch for while using this medicine? Check your heart rate and blood pressure regularly while you are taking this medicine. Ask your doctor or health care professional what your heart rate and blood pressure should be, and when you should contact him or her. Do not stop taking this medicine suddenly. This could lead to serious heart-related effects. Contact your doctor or health care professional if you have difficulty breathing while taking this drug. Check your weight daily. Ask your doctor or health care professional when you should notify him/her of any weight gain. You may get drowsy or dizzy. Do not drive, use machinery, or do anything that requires mental alertness until you know how this medicine affects you. To reduce the risk of dizzy or fainting spells, do not sit or stand up quickly. Alcohol can make you more drowsy, and increase flushing and rapid heartbeats. Avoid alcoholic drinks. If you have diabetes, check your blood sugar as directed. Tell your doctor if you have changes in your blood sugar while you are taking this medicine. If you are going to have surgery, tell your doctor or health care professional that you are taking this medicine. What side effects may I notice from receiving this medicine? Side effects that you should report to your doctor or health care professional as soon as possible: -allergic reactions like skin rash, itching or hives, swelling of the face, lips, or tongue -breathing problems -dark urine -irregular heartbeat -swollen legs or ankles -vomiting -yellowing of the eyes or skin Side effects that usually do not require medical attention (report to your doctor or health care professional if they continue or are bothersome): -change in sex drive or performance -diarrhea -dry eyes (especially if wearing contact lenses) -dry, itching skin -headache -nausea -unusually tired This list may not  describe all possible side effects. Call your doctor for medical advice about side effects. You may report side effects to FDA at 1-800-FDA-1088. Where should I keep my medicine? Keep out of the reach of children. Store at room temperature below 30 degrees C (86 degrees F). Protect from moisture. Keep container tightly closed. Throw away any unused medicine after the expiration date. NOTE: This sheet is a summary. It may not cover all possible information. If you have questions about this medicine, talk to your doctor, pharmacist, or health care provider.  2018 Elsevier/Gold Standard (2013-02-12 14:12:02)

## 2016-09-26 LAB — TSH: TSH: 2.68 u[IU]/mL (ref 0.450–4.500)

## 2016-10-08 DIAGNOSIS — D510 Vitamin B12 deficiency anemia due to intrinsic factor deficiency: Secondary | ICD-10-CM | POA: Diagnosis not present

## 2016-10-16 ENCOUNTER — Other Ambulatory Visit: Payer: Self-pay | Admitting: Cardiovascular Disease

## 2016-10-19 DIAGNOSIS — G4733 Obstructive sleep apnea (adult) (pediatric): Secondary | ICD-10-CM | POA: Diagnosis not present

## 2016-10-22 ENCOUNTER — Other Ambulatory Visit: Payer: Self-pay | Admitting: "Endocrinology

## 2016-10-22 ENCOUNTER — Other Ambulatory Visit: Payer: Self-pay | Admitting: Physician Assistant

## 2016-10-22 DIAGNOSIS — E1159 Type 2 diabetes mellitus with other circulatory complications: Secondary | ICD-10-CM | POA: Diagnosis not present

## 2016-10-22 DIAGNOSIS — E038 Other specified hypothyroidism: Secondary | ICD-10-CM | POA: Diagnosis not present

## 2016-10-22 LAB — COMPREHENSIVE METABOLIC PANEL
ALBUMIN: 3.9 g/dL (ref 3.6–5.1)
ALT: 18 U/L (ref 6–29)
AST: 11 U/L (ref 10–35)
Alkaline Phosphatase: 48 U/L (ref 33–130)
BUN: 21 mg/dL (ref 7–25)
CHLORIDE: 105 mmol/L (ref 98–110)
CO2: 26 mmol/L (ref 20–31)
CREATININE: 1.08 mg/dL — AB (ref 0.60–0.93)
Calcium: 9.4 mg/dL (ref 8.6–10.4)
Glucose, Bld: 126 mg/dL — ABNORMAL HIGH (ref 65–99)
POTASSIUM: 4.1 mmol/L (ref 3.5–5.3)
SODIUM: 140 mmol/L (ref 135–146)
Total Bilirubin: 0.4 mg/dL (ref 0.2–1.2)
Total Protein: 6.4 g/dL (ref 6.1–8.1)

## 2016-10-22 LAB — TSH: TSH: 1.53 mIU/L

## 2016-10-22 LAB — T4, FREE: FREE T4: 2.1 ng/dL — AB (ref 0.8–1.8)

## 2016-10-23 LAB — HEMOGLOBIN A1C
HEMOGLOBIN A1C: 7.1 % — AB (ref <5.7)
MEAN PLASMA GLUCOSE: 157 mg/dL

## 2016-10-26 DIAGNOSIS — E113311 Type 2 diabetes mellitus with moderate nonproliferative diabetic retinopathy with macular edema, right eye: Secondary | ICD-10-CM | POA: Diagnosis not present

## 2016-10-26 DIAGNOSIS — E113511 Type 2 diabetes mellitus with proliferative diabetic retinopathy with macular edema, right eye: Secondary | ICD-10-CM | POA: Diagnosis not present

## 2016-10-26 DIAGNOSIS — H348322 Tributary (branch) retinal vein occlusion, left eye, stable: Secondary | ICD-10-CM | POA: Diagnosis not present

## 2016-10-27 ENCOUNTER — Other Ambulatory Visit: Payer: Self-pay | Admitting: Cardiovascular Disease

## 2016-10-27 MED ORDER — APIXABAN 5 MG PO TABS: 5.0000 mg | ORAL_TABLET | Freq: Two times a day (BID) | ORAL | 3 refills | Status: DC

## 2016-10-27 MED ORDER — HYDRALAZINE HCL 50 MG PO TABS: 100.0000 mg | ORAL_TABLET | Freq: Three times a day (TID) | ORAL | 6 refills | Status: DC

## 2016-10-27 NOTE — Telephone Encounter (Signed)
REFILLED AS REQUESTED ?

## 2016-10-27 NOTE — Telephone Encounter (Signed)
° °  1. Which medications need to be refilled? (please list name of each medication and dose if known)   apixaban (ELIQUIS) 5 MG TABS tablet [[id number]]   hydrALAZINE (APRESOLINE) 50 MG tablet [[id number]]     2. Which pharmacy/location (including street and city if local pharmacy) is [facility] to [facility]  3. Do they need a 30 day or 90 day supply?   0 day

## 2016-10-29 ENCOUNTER — Ambulatory Visit (INDEPENDENT_AMBULATORY_CARE_PROVIDER_SITE_OTHER): Payer: PPO | Admitting: "Endocrinology

## 2016-10-29 ENCOUNTER — Other Ambulatory Visit: Payer: Self-pay | Admitting: *Deleted

## 2016-10-29 ENCOUNTER — Encounter: Payer: Self-pay | Admitting: "Endocrinology

## 2016-10-29 ENCOUNTER — Encounter: Payer: PPO | Attending: "Endocrinology | Admitting: Nutrition

## 2016-10-29 VITALS — Ht 63.0 in | Wt 149.0 lb

## 2016-10-29 VITALS — BP 124/74 | HR 82 | Ht 63.0 in | Wt 149.0 lb

## 2016-10-29 DIAGNOSIS — I10 Essential (primary) hypertension: Secondary | ICD-10-CM

## 2016-10-29 DIAGNOSIS — E038 Other specified hypothyroidism: Secondary | ICD-10-CM

## 2016-10-29 DIAGNOSIS — E1165 Type 2 diabetes mellitus with hyperglycemia: Secondary | ICD-10-CM

## 2016-10-29 DIAGNOSIS — Z794 Long term (current) use of insulin: Principal | ICD-10-CM

## 2016-10-29 DIAGNOSIS — IMO0002 Reserved for concepts with insufficient information to code with codable children: Secondary | ICD-10-CM

## 2016-10-29 DIAGNOSIS — E782 Mixed hyperlipidemia: Secondary | ICD-10-CM

## 2016-10-29 DIAGNOSIS — E1159 Type 2 diabetes mellitus with other circulatory complications: Secondary | ICD-10-CM

## 2016-10-29 DIAGNOSIS — E118 Type 2 diabetes mellitus with unspecified complications: Principal | ICD-10-CM

## 2016-10-29 MED ORDER — HYDRALAZINE HCL 50 MG PO TABS: 100.0000 mg | ORAL_TABLET | Freq: Three times a day (TID) | ORAL | 3 refills | Status: DC

## 2016-10-29 MED ORDER — LEVOTHYROXINE SODIUM 137 MCG PO TABS: 137.0000 ug | ORAL_TABLET | Freq: Every day | ORAL | 3 refills | Status: DC

## 2016-10-29 MED ORDER — APIXABAN 5 MG PO TABS: 5.0000 mg | ORAL_TABLET | Freq: Two times a day (BID) | ORAL | 3 refills | Status: DC

## 2016-10-29 MED ORDER — APIXABAN 5 MG PO TABS: 5.0000 mg | ORAL_TABLET | Freq: Two times a day (BID) | ORAL | 6 refills | Status: DC

## 2016-10-29 NOTE — Patient Instructions (Signed)
Advice for weight management -For most of us the best way to lose weight is by diet management. Generally speaking, diet management means restricting carbohydrate consumption to minimum possible (and to unprocessed or minimally processed complex starch) and increasing protein intake (animal or plant source), fruits, and vegetables.  -Sticking to a routine mealtime to eat 3 meals a day and avoiding unnecessary snacks is shown to have a big role in weight control.  -It is better to avoid simple carbohydrates including: Cakes, Desserts, Ice Cream, Soda (diet and regular), Sweet Tea, Candies, Chips, Cookies, Artificial Sweeteners, and "Sugar-free" Products.   -Exercise: 30 minutes a day 3-4 days a week, or 150 minutes a week. Combine stretch, strength, and aerobic activities. You may seek evaluation by your heart doctor prior to initiating exercise if you have high risk for heart disease.  -If you are interested, we can schedule a visit with [place] Huber, RDN, CDE for individualized nutrition education.  

## 2016-10-29 NOTE — Progress Notes (Signed)
Diabetes Self-Management Education  Visit Type:  Follow-up  Appt. Start Time: 1100 Appt. End Time: 1130  10/29/2016  Virginia Huber, identified by name and date of birth, is a 71 y.o. female with a diagnosis of Diabetes:  .     Has been watchinig portions, timings of meals and taking insulin as prescribed. Was on 40 units 70/30 in am and 40 units in pm but saw Dr .Dorris Fetch today and it's reduced to 30 units BID.  FBS 104 mg/dl. A1C 7.1%.One low blood sugar of 53 before lunch the other day. She ate lunch immediately and her BS returned to normal.  ASSESSMENT  Height 5\' 3"  (1.6 m), weight 149 lb (67.6 kg). Body mass index is 26.39 kg/m. Wt Readings from Last 3 Encounters:  10/29/16 149 lb (67.6 kg)  10/29/16 149 lb (67.6 kg)  09/25/16 148 lb 3.2 oz (67.2 kg)   Ht Readings from Last 3 Encounters:  10/29/16 5\' 3"  (1.6 m)  10/29/16 5\' 3"  (1.6 m)  09/25/16 5\' 3"  (1.6 m)   Body mass index is 26.39 kg/m.      Diabetes Self-Management Education - 10/29/16 1115      Health Coping   How would you rate your overall health? Good     Pre-Education Assessment   Patient understands the diabetes disease and treatment process. Demonstrates understanding / competency   Patient understands incorporating nutritional management into lifestyle. Demonstrates understanding / competency   Patient undertands incorporating physical activity into lifestyle. Demonstrates understanding / competency   Patient understands using medications safely. Demonstrates understanding / competency   Patient understands monitoring blood glucose, interpreting and using results Demonstrates understanding / competency   Patient understands prevention, detection, and treatment of acute complications. Demonstrates understanding / competency   Patient understands prevention, detection, and treatment of chronic complications. Demonstrates understanding / competency   Patient understands how to develop strategies to address  psychosocial issues. Demonstrates understanding / competency   Patient understands how to develop strategies to promote health/change behavior. Demonstrates understanding / competency     Complications   Last HgB A1C per patient/outside source 7.1 %   How often do you check your blood sugar? 1-2 times/day   Fasting Blood glucose range (mg/dL) 130-179   Number of hypoglycemic episodes per month 1   Can you tell when your blood sugar is low? Yes   What do you do if your blood sugar is low? eat   Number of hyperglycemic episodes per week 0   Have you had a dilated eye exam in the past 12 months? Yes   Have you had a dental exam in the past 12 months? Yes   Are you checking your feet? Yes   How many days per week are you checking your feet? 7     Dietary Intake   Breakfast Eggs, toast, fruit, and coffee   Lunch Banana sandich, Unsweet tea   Dinner Green beans, squash baked chicken and corn bread muffin. water, unsweet tea   Beverage(s) water or unsweet team     Exercise   Exercise Type Light (walking / raking leaves)   How many days per week to you exercise? 30   How many minutes per day do you exercise? 4   Total minutes per week of exercise 120     Patient Education   Nutrition management  Carbohydrate counting;Meal timing in regards to the patients' current diabetes medication.;Meal options for control of blood glucose level and chronic complications.;Reviewed blood glucose  goals for pre and post meals and how to evaluate the patients' food intake on their blood glucose level.   Medications Reviewed medication adjustment guidelines for hyperglycemia and sick days.     Individualized Goals (developed by patient)   Nutrition Follow meal plan discussed;General guidelines for healthy choices and portions discussed;Adjust meds/carbs with exercise as discussed   Physical Activity Exercise 3-5 times per week;30 minutes per day   Medications take my medication as prescribed   Monitoring   test my blood glucose as discussed;send in my blood glucose log as discussed;test blood glucose pre and post meals as discussed   Reducing Risk examine blood glucose patterns;get labs drawn;do foot checks daily;treat hypoglycemia with 15 grams of carbs if blood glucose less than 70mg /dL;increase portions of olive oil in diet;increase portions of healthy fats     Patient Self-Evaluation of Goals - Patient rates self as meeting previously set goals (% of time)   Nutrition >75%   Physical Activity >75%   Medications >75%   Monitoring >75%   Problem Solving >75%   Reducing Risk >75%   Health Coping >75%     Post-Education Assessment   Patient understands the diabetes disease and treatment process. Demonstrates understanding / competency   Patient understands incorporating nutritional management into lifestyle. Demonstrates understanding / competency   Patient undertands incorporating physical activity into lifestyle. Demonstrates understanding / competency   Patient understands using medications safely. Demonstrates understanding / competency   Patient understands monitoring blood glucose, interpreting and using results Demonstrates understanding / competency   Patient understands prevention, detection, and treatment of acute complications. Demonstrates understanding / competency   Patient understands prevention, detection, and treatment of chronic complications. Demonstrates understanding / competency   Patient understands how to develop strategies to address psychosocial issues. Demonstrates understanding / competency   Patient understands how to develop strategies to promote health/change behavior. Demonstrates understanding / competency     Outcomes   Program Status Completed      Learning Objective:  Patient will have a greater understanding of diabetes self-management. Patient education plan is to attend individual and/or group sessions per assessed needs and concerns.   Plan:    Patient Instructions  Goals 1. Follow Plate Method 2. Continue eat 2-3 choices per meal 3. Prevent low blood sugars 4. Eating protein at each meal Walk or stationary bike daily. Take 70/30 30 units twice a day. Increase low carb veggies with lunch    Expected Outcomes:  Demonstrated interest in learning. Expect positive outcomes  Education material provided: My Plate and Carbohydrate counting sheet  If problems or questions, patient to contact team via:  Phone and Email  Future DSME appointment: - 3-4 months

## 2016-10-29 NOTE — Progress Notes (Signed)
Subjective:    Patient ID: Virginia Huber, female    DOB: 1946/04/01. Patient is being seen in f/u for management of diabetes requested by  Sharilyn Sites, MD  Past Medical History:  Diagnosis Date  . Arthritis   . Atrial fibrillation and flutter ([place])    a. h/o PAF/flutter during admission in 2013 for PNA. b. PAF during adm for NSTEMI 07/2015, subsequent paroxysms since then.  . B12 deficiency anemia   . Cardiac tamponade 06/2016  . Coronary artery disease 11/30/2014   a. remote MI. b. h/o PTCA with scoring balloon to OM1 11/2014. c. NSTEMI 03/2015 s/p DES to prox-mid Cx. d. NSTEMI 07/2015 s/p scoring balloon/PTCA/DES to dRCA with PAF during that admission  . Cutaneous lupus erythematosus   . GERD (gastroesophageal reflux disease)   . Heart block   . History of blood transfusion 1980's   2nd surgical procedures  . HTN (hypertension)   . Hypercholesteremia   . Hypothyroidism   . Myocardial infarction ([place]) 02/2012  . Ovarian tumor   . PAD (peripheral artery disease) ([place])    a. s/p LE angio 2015; followed by Dr. Fletcher Anon - managed medically.  . Pain with urination 05/08/2015  . Paroxysmal atrial flutter ([place])   . Pericardial effusion    a. 06/2016 after ppm - s/p pericardiocentesis.  . Superficial fungus infection of skin 06/29/2013  . Tachy-brady syndrome ([place])    a. s/p Medtronic PPM 06/2016, c/b lead perf/pericardial effusion.  [name] TIA (transient ischemic attack) 08/2001; ~ 2006  . Type II diabetes mellitus ([place])   . UTI (urinary tract infection) 05/08/2013   Past Surgical History:  Procedure Laterality Date  . ABDOMINAL AORTAGRAM N/A 01/03/2014   Procedure: ABDOMINAL [name];  Surgeon: Wellington Hampshire, MD;  Location: [facility];  Service: Cardiovascular;  Laterality: N/A;  . ABDOMINAL HYSTERECTOMY  1972   "partial"  . APPENDECTOMY  1970's  . CARDIAC CATHETERIZATION  2008   Tiny OM-2 with 90% narrowing. Med tx.  [name] CARDIAC CATHETERIZATION N/A 11/30/2014   Procedure: Left  Heart Cath and Coronary Angiography;  Surgeon: Troy Sine, MD; LAD 20%, CFX 50%, OM1 95%, right PLB 30%, LV normal   . CARDIAC CATHETERIZATION N/A 11/30/2014   Procedure: Coronary Balloon Angioplasty;  Surgeon: Troy Sine, MD;  Angiosculpt scoring balloon and PTCA to the OM1 reducing stenosis from 95% to less than 10%  . CARDIAC CATHETERIZATION N/A 04/03/2015   Procedure: Left Heart Cath and Coronary Angiography;  Surgeon: Jolaine Artist, MD; dLAD 50%, CFX 90%, OM1 100%, PLA 15%, LVEDP 13    . CARDIAC CATHETERIZATION N/A 04/03/2015   Procedure: Coronary Stent Intervention;  Surgeon: Sherren Mocha, MD; 3.0x18 mm Xience DES to the CFX    . CARDIAC CATHETERIZATION N/A 08/02/2015   Procedure: Left Heart Cath and Coronary Angiography;  Surgeon: Troy Sine, MD;  Location: [facility];  Service: Cardiovascular;  Laterality: N/A;  . CARDIAC CATHETERIZATION N/A 08/02/2015   Procedure: Coronary Stent Intervention;  Surgeon: Troy Sine, MD;  Location: [facility];  Service: Cardiovascular;  Laterality: N/A;  . CARDIAC CATHETERIZATION N/A 06/25/2016   Procedure: Pericardiocentesis;  Surgeon: Will Meredith Leeds, MD;  Location: [facility];  Service: Cardiovascular;  Laterality: N/A;  . cardiac stents    . CHOLECYSTECTOMY OPEN  1990's  . COLONOSCOPY  2005   Dr. Laural Golden: pancolonic divericula, polyp, path unknown currently  . COLONOSCOPY  2012   Dr. Oneida Alar: Normal  TI, scattered diverticula in entire colon, small internal hemorrhoids, normal colon biopsies. Colonoscopy in 5-10 years.   . COLOSTOMY  05/1979  . COLOSTOMY REVERSAL  11/1979  . EP IMPLANTABLE DEVICE N/A 06/25/2016   Procedure: Lead Revision/Repair;  Surgeon: Will Meredith Leeds, MD;  Location: [facility];  Service: Cardiovascular;  Laterality: N/A;  . EP IMPLANTABLE DEVICE N/A 06/25/2016   Procedure: Pacemaker Implant;  Surgeon: Will Meredith Leeds, MD;  Location: [facility];  Service:  Cardiovascular;  Laterality: N/A;  . EXCISIONAL HEMORRHOIDECTOMY  1970's  . EYE SURGERY Left 2000   "branch vein occlusion"  . EYE SURGERY Left ~ 2001   "smoothed out wrinkle"  . LEFT OOPHORECTOMY  05/1979   nicked bowel, peritonitis, colostomy; colostomy reversed 1981   . LOWER EXTREMITY ANGIOGRAM N/A 01/03/2014   Procedure: LOWER EXTREMITY ANGIOGRAM;  Surgeon: Wellington Hampshire, MD;  Location: [facility];  Service: Cardiovascular;  Laterality: N/A;  . Nuclear med stress test  10/2011   Small area of mild ischemia inferoapically.  [name] PARTIAL HYSTERECTOMY  1970's   left ovaries, then ovaries removed later due tumors   . RIGHT OOPHORECTOMY  1970's   Social History   Social History  . Marital status: Married    Spouse name: N/A  . Number of children: N/A  . Years of education: N/A   Occupational History  . Retired Retired    Research officer, political party   Social History Main Topics  . Smoking status: Never Smoker  . Smokeless tobacco: Never Used     Comment: Never smoked  . Alcohol use No  . Drug use: No  . Sexual activity: No     Comment: hyst   Other Topics Concern  . None   Social History Narrative  . None   Outpatient Encounter Prescriptions as of 10/29/2016  Medication Sig  . acetaminophen (TYLENOL) 325 MG tablet Take 650 mg by mouth every 6 (six) hours as needed for headache.  [name] amiodarone (PACERONE) 200 MG tablet Take 1 tablet (200 mg total) by mouth daily.  [name] apixaban (ELIQUIS) 5 MG TABS tablet Take 1 tablet (5 mg total) by mouth 2 (two) times daily.  [name] atorvastatin (LIPITOR) 80 MG tablet Take 1 tablet (80 mg total) by mouth daily at 6 PM.  . carvedilol (COREG) 12.5 MG tablet Take 1 tablet (12.5 mg total) by mouth 2 (two) times daily.  . cholecalciferol (VITAMIN D) 1000 UNITS tablet Take 1,000 Units by mouth at bedtime.   . clopidogrel (PLAVIX) 75 MG tablet TAKE ONE (1) TABLET BY MOUTH EVERY DAY  . diltiazem (CARDIZEM CD) 360 MG 24 hr capsule Take 1 capsule (360 mg total)  by mouth daily.  . diphenhydramine-acetaminophen (TYLENOL PM EXTRA STRENGTH) 25-500 MG TABS tablet Take 1 tablet by mouth at bedtime as needed (Take as directed).   . furosemide (LASIX) 40 MG tablet TAKE ONE TABLET BY MOUTH ONCE DAILY  . glucose blood (FREESTYLE LITE) test strip As directed bid. E11.65  . hydrALAZINE (APRESOLINE) 50 MG tablet Take 2 tablets (100 mg total) by mouth 3 (three) times daily.  . insulin NPH-regular Human (NOVOLIN 70/30) (70-30) 100 UNIT/ML injection Inject 30 Units into the skin See admin instructions. Inject 30 units before breakfast and 30 units before supper  - only if pre-meal blood glucose is above 90 mg/dL.  [name] levothyroxine (SYNTHROID, LEVOTHROID) 137 MCG tablet Take 1 tablet (137 mcg total) by mouth daily before breakfast.  . losartan (COZAAR) 100 MG  tablet TAKE ONE (1) TABLET BY MOUTH EVERY DAY  . magnesium oxide (MAG-OX) 400 MG tablet Take 400 mg by mouth daily.  . metFORMIN (GLUCOPHAGE) 500 MG tablet Take 500 mg by mouth 2 (two) times daily with a meal.  . nitroGLYCERIN (NITROSTAT) 0.4 MG SL tablet Place 1 tablet (0.4 mg total) under the tongue every 5 (five) minutes as needed for chest pain.  . Omega-3 Fatty Acids (FISH OIL) 1200 MG CAPS Take 2 capsules (2,400 mg total) by mouth 2 (two) times daily.  . potassium chloride SA (K-DUR,KLOR-CON) 20 MEQ tablet TAKE ONE (1) TABLET BY MOUTH EVERY DAY  . rOPINIRole (REQUIP) 0.5 MG tablet Take 0.5 mg by mouth at bedtime.   . [DISCONTINUED] levothyroxine (SYNTHROID, LEVOTHROID) 150 MCG tablet Take 150 mcg by mouth daily before breakfast.   No facility-administered encounter medications on file as of 10/29/2016.    ALLERGIES: VACCINATION STATUS: Immunization History  Administered Date(s) Administered  . Influenza-Unspecified 03/27/2015  . Pneumococcal-Unspecified 02/21/2012    Diabetes  She presents for her follow-up diabetic visit. She has type 2 diabetes mellitus. Onset time: She was diagnosed at approximate age  of 31 years. Her disease course has been stable. There are no hypoglycemic associated symptoms. Pertinent negatives for hypoglycemia include no confusion, headaches, pallor, seizures or tremors. Associated symptoms include fatigue. Pertinent negatives for diabetes include no chest pain, no polydipsia, no polyphagia and no polyuria. There are no hypoglycemic complications. Symptoms are stable. Diabetic complications include a CVA and heart disease. Risk factors for coronary artery disease include diabetes mellitus, dyslipidemia, hypertension and sedentary lifestyle. Current diabetic treatments: She is on Novolin 70/30 60 units twice a day, metformin 500 mg 3 times a day. She is compliant with treatment most of the time. Her weight is increasing steadily. She is following a generally unhealthy diet. When asked about meal planning, she reported none. She has had a previous visit with a dietitian. She participates in exercise intermittently. Home blood sugar record trend: She did not bring the meter to review today. She mentions that she is monitoring randomly and regular.[name] Her breakfast blood glucose range is generally 130-140 mg/dl. Her overall blood glucose range is 130-140 mg/dl. An ACE inhibitor/angiotensin II receptor blocker is being taken. Eye exam is current.  Hyperlipidemia  This is a chronic problem. The current episode started more than 1 year ago. The problem is uncontrolled. Recent lipid tests were reviewed and are high. Exacerbating diseases include diabetes and hypothyroidism. Pertinent negatives include no chest pain, myalgias or shortness of breath. Current antihyperlipidemic treatment includes statins, fibric acid derivatives and bile acid squestrants. The current treatment provides moderate improvement of lipids.  Hypertension  This is a chronic problem. The current episode started more than 1 year ago. The problem is uncontrolled. Pertinent negatives include no chest pain, headaches,  palpitations or shortness of breath. Risk factors for coronary artery disease include dyslipidemia, diabetes mellitus and sedentary lifestyle. Past treatments include angiotensin blockers. Hypertensive end-organ damage includes CAD/MI and CVA.     Review of Systems  Constitutional: Positive for fatigue. Negative for chills, fever and unexpected weight change.  HENT: Negative for trouble swallowing and voice change.   Eyes: Negative for visual disturbance.  Respiratory: Negative for cough, shortness of breath and wheezing.   Cardiovascular: Negative for chest pain, palpitations and leg swelling.       No Shortness of breath  Gastrointestinal: Negative for abdominal pain, diarrhea, nausea and vomiting.  Endocrine: Negative for cold intolerance, heat intolerance, polydipsia,  polyphagia and polyuria.  Genitourinary: Negative for frequency, hematuria and urgency.  Musculoskeletal: Negative for arthralgias and myalgias.  Skin: Negative for color change, pallor, rash and wound.  Neurological: Negative for tremors, seizures and headaches.  Hematological: Does not bruise/bleed easily.  Psychiatric/Behavioral: Negative for confusion, hallucinations and suicidal ideas.    Objective:    BP 124/74   Pulse 82   Ht 5\' 3"  (1.6 m)   Wt 149 lb (67.6 kg)   BMI 26.39 kg/m   Wt Readings from Last 3 Encounters:  10/29/16 149 lb (67.6 kg)  09/25/16 148 lb 3.2 oz (67.2 kg)  09/08/16 143 lb (64.9 kg)    Physical Exam  Constitutional: She is oriented to person, place, and time. She appears well-developed.  HENT:  Head: Normocephalic and atraumatic.  Eyes: EOM are normal.  Neck: Normal range of motion. Neck supple. No tracheal deviation present. No thyromegaly present.  Cardiovascular: Normal rate and regular rhythm.   Pulmonary/Chest: Effort normal and breath sounds normal.  Abdominal: Soft. Bowel sounds are normal. There is no tenderness. There is no guarding.  Musculoskeletal: Normal range of  motion. She exhibits no edema.  Neurological: She is alert and oriented to person, place, and time. She has normal reflexes. No cranial nerve deficit. Coordination normal.  Skin: Skin is warm and dry. No rash noted. No erythema. No pallor.  Psychiatric: She has a normal mood and affect. Judgment normal.    CMP     Component Value Date/Time   NA 140 10/22/2016 0922   NA 140 06/23/2016 1550   K 4.1 10/22/2016 0922   CL 105 10/22/2016 0922   CO2 26 10/22/2016 0922   GLUCOSE 126 (H) 10/22/2016 0922   BUN 21 10/22/2016 0922   BUN 21 06/23/2016 1550   CREATININE 1.08 (H) 10/22/2016 0922   CALCIUM 9.4 10/22/2016 0922   PROT 6.4 10/22/2016 0922   ALBUMIN 3.9 10/22/2016 0922   AST 11 10/22/2016 0922   ALT 18 10/22/2016 0922   ALKPHOS 48 10/22/2016 0922   BILITOT 0.4 10/22/2016 0922   GFRNONAA 58 (L) 06/23/2016 1550   GFRAA 66 06/23/2016 1550     Diabetic Labs (most recent): Lab Results  Component Value Date   HGBA1C 7.1 (H) 10/22/2016   HGBA1C 6.3 (H) 07/21/2016   HGBA1C 7.2 (H) 06/07/2016     Lipid Panel ( most recent) Lipid Panel     Component Value Date/Time   CHOL 116 (L) 04/14/2016 0910   TRIG 439 (H) 04/14/2016 0910   HDL 24 (L) 04/14/2016 0910   CHOLHDL 4.8 04/14/2016 0910   VLDL NOT CALC 04/14/2016 0910   LDLCALC NOT CALC 04/14/2016 0910       Assessment & Plan:   1. DM type 2 causing vascular disease ([place]) - Patient has currently uncontrolled symptomatic type 2 DM since  71 years of age. - She came with Continued improvement in her glucose profile and her A1c Stable at 7.1%.  - I have reviewed her most recent labs.  -Her diabetes is complicated by coronary artery disease which is recurrent requiring stent placement , CVA, and patient remains at a high risk for more acute and chronic complications of diabetes which include CAD, CVA, CKD, retinopathy, and neuropathy. These are all discussed in detail with the patient.  - I have counseled the patient on diet  management and weight loss, by adopting a carbohydrate restricted/protein rich diet.  - Suggestion is made for patient to avoid simple carbohydrates  from their diet including Cakes , Desserts, Ice Cream,  Soda (  diet and regular) , Sweet Tea , Candies,  Chips, Cookies, Artificial Sweeteners,   and "Sugar-free" Products . This will help patient to have stable blood glucose profile and potentially avoid unintended weight gain.  - I encouraged the patient to switch to  unprocessed or minimally processed complex starch and increased protein intake (animal or plant source), fruits, and vegetables.  - Patient is advised to stick to a routine mealtimes to eat 3 meals  a day and avoid unnecessary snacks ( to snack only to correct hypoglycemia).  - The patient will be scheduled with Jearld Fenton, RDN, CDE for individualized DM education.  - I have approached patient with the following individualized plan to manage diabetes and patient agrees:   - Due to cost reasons , and because of the fact that she is responding appropriately, I will keep her on  Novolin 70/30. - I will continue Novolin 70/30  30 units with breakfast and 30 units with supper when pre-meal blood glucose is above 90 mg/dL. - She is advised to start strict monitoring of glucose  AC and HS. - If her blood glucose profile is significantly abnormal, she will be considered for insulin analogs. - Patient is warned not to take insulin without proper monitoring per orders.  -Patient is encouraged to call clinic for blood glucose levels less than 70 or above 300 mg /dl. - I will continue metformin  500 mg by mouth twice a day  ,  Still therapeutically suitable for patient.   - Patient specific target  A1c;  LDL, HDL, Triglycerides, and  Waist Circumference were discussed in detail.  2) BP/HTN: Uncontrolled. Continue current medications including ACEI/ARB. 3) Lipids/HPL:  Uncontrolled with triglycerides of 439, LDL was not calculated. I  advised her to continue statins and increase her fish oil to 2 capsules twice a day. 4)  Weight/Diet: CDE Consult has been initiated , exercise, and detailed carbohydrates information provided.   5) hypothyroidism: Long-standing for at least 20 years.  - Her labs are consistent with over- replacement. I will lower her levothyroxine to 137 g by mouth every morning.   - We discussed about correct intake of levothyroxine, at fasting, with water, separated by at least 30 minutes from breakfast, and separated by more than 4 hours from calcium, iron, multivitamins, acid reflux medications (PPIs). -Patient is made aware of the fact that thyroid hormone replacement is needed for life, dose to be adjusted by periodic monitoring of thyroid function tests.  6) Chronic Care/Health Maintenance:  -Patient is on ACEI/ARB and Statin medications and encouraged to continue to follow up with Ophthalmology, Podiatrist at least yearly or according to recommendations, and advised to  stay away from smoking. I have recommended yearly flu vaccine and pneumonia vaccination at least every 5 years; moderate intensity exercise for up to 150 minutes weekly; and  sleep for at least 7 hours a day.  - 30 minutes of time was spent on the care of this patient , 50% of which was applied for counseling on diabetes complications and their preventions.  - Patient to bring meter and  blood glucose logs during their next visit.   - I advised patient to maintain close follow up with Sharilyn Sites, MD for primary care needs.  Follow up plan: - Return in about 3 months (around 01/29/2017) for follow up with pre-visit labs, meter, and logs.  Glade Lloyd, MD Phone: [phone]  Fax:  [phone]   10/29/2016, 11:07 AM

## 2016-10-29 NOTE — Patient Instructions (Signed)
Goals 1. Follow Plate Method 2. Continue eat 2-3 choices per meal 3. Prevent low blood sugars 4. Eating protein at each meal Walk or stationary bike daily. Take 70/30 30 units twice a day. Increase low carb veggies with lunch

## 2016-10-30 ENCOUNTER — Telehealth: Payer: Self-pay | Admitting: Adult Health

## 2016-10-30 NOTE — Telephone Encounter (Signed)
[facility] pharmacy line busy x 2

## 2016-10-30 NOTE — Telephone Encounter (Signed)
Please call [facility] concerning pt's Rx [phone]

## 2016-11-02 NOTE — Telephone Encounter (Signed)
[place] wanted to clarify hydralazine dose.

## 2016-11-11 DIAGNOSIS — D510 Vitamin B12 deficiency anemia due to intrinsic factor deficiency: Secondary | ICD-10-CM | POA: Diagnosis not present

## 2016-11-19 DIAGNOSIS — G4733 Obstructive sleep apnea (adult) (pediatric): Secondary | ICD-10-CM | POA: Diagnosis not present

## 2016-11-30 DIAGNOSIS — E113411 Type 2 diabetes mellitus with severe nonproliferative diabetic retinopathy with macular edema, right eye: Secondary | ICD-10-CM | POA: Diagnosis not present

## 2016-11-30 DIAGNOSIS — H43811 Vitreous degeneration, right eye: Secondary | ICD-10-CM | POA: Diagnosis not present

## 2016-11-30 DIAGNOSIS — H348322 Tributary (branch) retinal vein occlusion, left eye, stable: Secondary | ICD-10-CM | POA: Diagnosis not present

## 2016-11-30 DIAGNOSIS — H33301 Unspecified retinal break, right eye: Secondary | ICD-10-CM | POA: Diagnosis not present

## 2016-11-30 DIAGNOSIS — H4311 Vitreous hemorrhage, right eye: Secondary | ICD-10-CM | POA: Diagnosis not present

## 2016-12-10 DIAGNOSIS — E539 Vitamin B deficiency, unspecified: Secondary | ICD-10-CM | POA: Diagnosis not present

## 2016-12-15 DIAGNOSIS — H34831 Tributary (branch) retinal vein occlusion, right eye, with macular edema: Secondary | ICD-10-CM | POA: Diagnosis not present

## 2016-12-15 DIAGNOSIS — E113411 Type 2 diabetes mellitus with severe nonproliferative diabetic retinopathy with macular edema, right eye: Secondary | ICD-10-CM | POA: Diagnosis not present

## 2016-12-15 DIAGNOSIS — H348322 Tributary (branch) retinal vein occlusion, left eye, stable: Secondary | ICD-10-CM | POA: Diagnosis not present

## 2016-12-17 ENCOUNTER — Encounter: Payer: Self-pay | Admitting: Cardiovascular Disease

## 2016-12-17 ENCOUNTER — Ambulatory Visit (INDEPENDENT_AMBULATORY_CARE_PROVIDER_SITE_OTHER): Payer: PPO | Admitting: Cardiovascular Disease

## 2016-12-17 VITALS — BP 173/79 | HR 76 | Ht 63.0 in | Wt 151.6 lb

## 2016-12-17 DIAGNOSIS — I480 Paroxysmal atrial fibrillation: Secondary | ICD-10-CM

## 2016-12-17 DIAGNOSIS — Z950 Presence of cardiac pacemaker: Secondary | ICD-10-CM | POA: Diagnosis not present

## 2016-12-17 DIAGNOSIS — I25118 Atherosclerotic heart disease of native coronary artery with other forms of angina pectoris: Secondary | ICD-10-CM

## 2016-12-17 DIAGNOSIS — I3139 Other pericardial effusion (noninflammatory): Secondary | ICD-10-CM

## 2016-12-17 DIAGNOSIS — Z79899 Other long term (current) drug therapy: Secondary | ICD-10-CM

## 2016-12-17 DIAGNOSIS — I313 Pericardial effusion (noninflammatory): Secondary | ICD-10-CM | POA: Diagnosis not present

## 2016-12-17 DIAGNOSIS — I4891 Unspecified atrial fibrillation: Secondary | ICD-10-CM

## 2016-12-17 DIAGNOSIS — I10 Essential (primary) hypertension: Secondary | ICD-10-CM | POA: Diagnosis not present

## 2016-12-17 DIAGNOSIS — G4733 Obstructive sleep apnea (adult) (pediatric): Secondary | ICD-10-CM

## 2016-12-17 DIAGNOSIS — E785 Hyperlipidemia, unspecified: Secondary | ICD-10-CM

## 2016-12-17 MED ORDER — CARVEDILOL 25 MG PO TABS: 25.0000 mg | ORAL_TABLET | Freq: Two times a day (BID) | ORAL | 3 refills | Status: DC

## 2016-12-17 NOTE — Patient Instructions (Signed)
Your physician wants you to follow-up in:  6 months with Dr Virgina Jock will receive a reminder letter in the mail two months in advance. If you don't receive a letter, please call our office to schedule the follow-up appointment.    INCREASE Coreg to 25 mg twice a day   No testing ordered for  this visit.       Thank you for choosing [facility] !

## 2016-12-17 NOTE — Progress Notes (Signed)
SUBJECTIVE: The patient presents for routine follow-up. She has coronary artery disease, atrial fibrillation, and chronic diastolic heart failure. She underwent pacemaker placement for symptomatic bradycardia in January 2018. This led to pericardial tamponade and she underwent pericardiocentesis with removal of 450 mL of bloody fluid.  Echocardiogram 08/03/16 demonstrated normal left ventricular systolic function, LVEF 69-48%, ischemic wall motion abnormalities, grade 2 diastolic dysfunction with elevated filling pressures, and a trivial pericardial effusion.  She denies chest pain. She very seldom has palpitations. She was not able to tolerate CPAP for sleep apnea.   Review of Systems: As per "subjective", otherwise negative.  Allergies  Allergen Reactions  . Penicillins Hives    Has patient had a PCN reaction causing immediate rash, facial/tongue/throat swelling, SOB or lightheadedness with hypotension: Yes Has patient had a PCN reaction causing severe rash involving mucus membranes or skin necrosis: No Has patient had a PCN reaction that required hospitalization No Has patient had a PCN reaction occurring within the last 10 years: No If all of the above answers are "NO", then may proceed with Cephalosporin use.  [name] Percocet [Oxycodone-Acetaminophen] Nausea And Vomiting    Current Outpatient Prescriptions  Medication Sig Dispense Refill  . acetaminophen (TYLENOL) 325 MG tablet Take 650 mg by mouth every 6 (six) hours as needed for headache.    [name] amiodarone (PACERONE) 200 MG tablet Take 1 tablet (200 mg total) by mouth daily. 30 tablet 6  . apixaban (ELIQUIS) 5 MG TABS tablet Take 1 tablet (5 mg total) by mouth 2 (two) times daily. 60 tablet 6  . atorvastatin (LIPITOR) 80 MG tablet Take 1 tablet (80 mg total) by mouth daily at 6 PM. 30 tablet 6  . carvedilol (COREG) 12.5 MG tablet Take 1 tablet (12.5 mg total) by mouth 2 (two) times daily. 180 tablet 3  . cholecalciferol (VITAMIN  D) 1000 UNITS tablet Take 1,000 Units by mouth at bedtime.     . clopidogrel (PLAVIX) 75 MG tablet TAKE ONE (1) TABLET BY MOUTH EVERY DAY 90 tablet 3  . diltiazem (CARDIZEM CD) 360 MG 24 hr capsule Take 1 capsule (360 mg total) by mouth daily. 30 capsule 11  . diphenhydramine-acetaminophen (TYLENOL PM EXTRA STRENGTH) 25-500 MG TABS tablet Take 1 tablet by mouth at bedtime as needed (Take as directed).     . furosemide (LASIX) 40 MG tablet TAKE ONE TABLET BY MOUTH ONCE DAILY 90 tablet 3  . glucose blood (FREESTYLE LITE) test strip As directed bid. E11.65 100 each 3  . hydrALAZINE (APRESOLINE) 50 MG tablet Take 2 tablets (100 mg total) by mouth 3 (three) times daily. 180 tablet 3  . insulin NPH-regular Human (NOVOLIN 70/30) (70-30) 100 UNIT/ML injection Inject 30 Units into the skin See admin instructions. Inject 30 units before breakfast and 30 units before supper  - only if pre-meal blood glucose is above 90 mg/dL.    [name] levothyroxine (SYNTHROID, LEVOTHROID) 137 MCG tablet Take 1 tablet (137 mcg total) by mouth daily before breakfast. 30 tablet 3  . losartan (COZAAR) 100 MG tablet TAKE ONE (1) TABLET BY MOUTH EVERY DAY 90 tablet 3  . magnesium oxide (MAG-OX) 400 MG tablet Take 400 mg by mouth daily.    . metFORMIN (GLUCOPHAGE) 500 MG tablet Take 500 mg by mouth 2 (two) times daily with a meal.    . nitroGLYCERIN (NITROSTAT) 0.4 MG SL tablet Place 1 tablet (0.4 mg total) under the tongue every 5 (five) minutes as needed for chest  pain. 25 tablet 12  . Omega-3 Fatty Acids (FISH OIL) 1200 MG CAPS Take 2 capsules (2,400 mg total) by mouth 2 (two) times daily. 120 each 3  . potassium chloride SA (K-DUR,KLOR-CON) 20 MEQ tablet TAKE ONE (1) TABLET BY MOUTH EVERY DAY 90 tablet 3  . rOPINIRole (REQUIP) 0.5 MG tablet Take 0.5 mg by mouth at bedtime.   0   No current facility-administered medications for this visit.     Past Medical History:  Diagnosis Date  . Arthritis   . Atrial fibrillation and flutter  ([place])    a. h/o PAF/flutter during admission in 2013 for PNA. b. PAF during adm for NSTEMI 07/2015, subsequent paroxysms since then.  . B12 deficiency anemia   . Cardiac tamponade 06/2016  . Coronary artery disease 11/30/2014   a. remote MI. b. h/o PTCA with scoring balloon to OM1 11/2014. c. NSTEMI 03/2015 s/p DES to prox-mid Cx. d. NSTEMI 07/2015 s/p scoring balloon/PTCA/DES to dRCA with PAF during that admission  . Cutaneous lupus erythematosus   . GERD (gastroesophageal reflux disease)   . Heart block   . History of blood transfusion 1980's   2nd surgical procedures  . HTN (hypertension)   . Hypercholesteremia   . Hypothyroidism   . Myocardial infarction ([place]) 02/2012  . Ovarian tumor   . PAD (peripheral artery disease) ([place])    a. s/p LE angio 2015; followed by Dr. Fletcher Anon - managed medically.  . Pain with urination 05/08/2015  . Paroxysmal atrial flutter ([place])   . Pericardial effusion    a. 06/2016 after ppm - s/p pericardiocentesis.  . Superficial fungus infection of skin 06/29/2013  . Tachy-brady syndrome ([place])    a. s/p Medtronic PPM 06/2016, c/b lead perf/pericardial effusion.  [name] TIA (transient ischemic attack) 08/2001; ~ 2006  . Type II diabetes mellitus ([place])   . UTI (urinary tract infection) 05/08/2013    Past Surgical History:  Procedure Laterality Date  . ABDOMINAL AORTAGRAM N/A 01/03/2014   Procedure: ABDOMINAL [name];  Surgeon: Wellington Hampshire, MD;  Location: [facility] CATH [facility];  Service: Cardiovascular;  Laterality: N/A;  . ABDOMINAL HYSTERECTOMY  1972   "partial"  . APPENDECTOMY  1970's  . CARDIAC CATHETERIZATION  2008   Tiny OM-2 with 90% narrowing. Med tx.  [name] CARDIAC CATHETERIZATION N/A 11/30/2014   Procedure: Left Heart Cath and Coronary Angiography;  Surgeon: Troy Sine, MD; LAD 20%, CFX 50%, OM1 95%, right PLB 30%, LV normal   . CARDIAC CATHETERIZATION N/A 11/30/2014   Procedure: Coronary Balloon Angioplasty;  Surgeon: Troy Sine, MD;  Angiosculpt scoring  balloon and PTCA to the OM1 reducing stenosis from 95% to less than 10%  . CARDIAC CATHETERIZATION N/A 04/03/2015   Procedure: Left Heart Cath and Coronary Angiography;  Surgeon: Jolaine Artist, MD; dLAD 50%, CFX 90%, OM1 100%, PLA 15%, LVEDP 13    . CARDIAC CATHETERIZATION N/A 04/03/2015   Procedure: Coronary Stent Intervention;  Surgeon: Sherren Mocha, MD; 3.0x18 mm Xience DES to the CFX    . CARDIAC CATHETERIZATION N/A 08/02/2015   Procedure: Left Heart Cath and Coronary Angiography;  Surgeon: Troy Sine, MD;  Location: [facility];  Service: Cardiovascular;  Laterality: N/A;  . CARDIAC CATHETERIZATION N/A 08/02/2015   Procedure: Coronary Stent Intervention;  Surgeon: Troy Sine, MD;  Location: [facility] CV [facility];  Service: Cardiovascular;  Laterality: N/A;  . CARDIAC CATHETERIZATION N/A 06/25/2016   Procedure: Pericardiocentesis;  Surgeon: Will Meredith Leeds, MD;  Location:  [facility] IN[facility]AS[facility];  Service: Cardiovascular;  Laterality: N/A;  . cardiac stents    . CHOLECYSTECTOMY OPEN  1990's  . COLONOSCOPY  2005   Dr. Laural Golden: pancolonic divericula, polyp, path unknown currently  . COLONOSCOPY  2012   Dr. Oneida Alar: Normal TI, scattered diverticula in entire colon, small internal hemorrhoids, normal colon biopsies. Colonoscopy in 5-10 years.   . COLOSTOMY  05/1979  . COLOSTOMY REVERSAL  11/1979  . EP IMPLANTABLE DEVICE N/A 06/25/2016   Procedure: Lead Revision/Repair;  Surgeon: Will Meredith Leeds, MD;  Location: [facility] CV [facility];  Service: Cardiovascular;  Laterality: N/A;  . EP IMPLANTABLE DEVICE N/A 06/25/2016   Procedure: Pacemaker Implant;  Surgeon: Will Meredith Leeds, MD;  Location: [facility] CV [facility];  Service: Cardiovascular;  Laterality: N/A;  . EXCISIONAL HEMORRHOIDECTOMY  1970's  . EYE SURGERY Left 2000   "branch vein occlusion"  . EYE SURGERY Left ~ 2001   "smoothed out wrinkle"  . LEFT OOPHORECTOMY  05/1979   nicked bowel, peritonitis, colostomy; colostomy  reversed 1981   . LOWER EXTREMITY ANGIOGRAM N/A 01/03/2014   Procedure: LOWER EXTREMITY ANGIOGRAM;  Surgeon: Wellington Hampshire, MD;  Location: [facility] CATH LAB;  Service: Cardiovascular;  Laterality: N/A;  . Nuclear med stress test  10/2011   Small area of mild ischemia inferoapically.  [name] PARTIAL HYSTERECTOMY  1970's   left ovaries, then ovaries removed later due tumors   . RIGHT OOPHORECTOMY  1970's    Social History   Social History  . Marital status: Married    Spouse name: N/A  . Number of children: N/A  . Years of education: N/A   Occupational History  . Retired Retired    Research officer, political party   Social History Main Topics  . Smoking status: Never Smoker  . Smokeless tobacco: Never Used     Comment: Never smoked  . Alcohol use No  . Drug use: No  . Sexual activity: No     Comment: hyst   Other Topics Concern  . Not on file   Social History Narrative  . No narrative on file     Vitals:   12/17/16 1015  BP: (!) 173/79  Pulse: 76  Weight: 151 lb 9.6 oz (68.8 kg)  Height: 5\' 3"  (1.6 m)    Wt Readings from Last 3 Encounters:  12/17/16 151 lb 9.6 oz (68.8 kg)  10/29/16 149 lb (67.6 kg)  10/29/16 149 lb (67.6 kg)     PHYSICAL EXAM General: NAD HEENT: Normal. Neck: No JVD, no thyromegaly. Lungs: Clear to auscultation bilaterally with normal respiratory effort. CV: Nondisplaced PMI.  Regular rate and rhythm, normal S1/S2, no S3/S4, no murmur. No pretibial or periankle edema.    Abdomen: Soft, nontender, no distention.  Neurologic: Alert and oriented.  Psych: Normal affect. Skin: Normal. Musculoskeletal: No gross deformities.    ECG: Most recent ECG reviewed.   Labs: Lab Results  Component Value Date/Time   K 4.1 10/22/2016 09:22 AM   BUN 21 10/22/2016 09:22 AM   BUN 21 06/23/2016 03:50 PM   CREATININE 1.08 (H) 10/22/2016 09:22 AM   ALT 18 10/22/2016 09:22 AM   TSH 1.53 10/22/2016 09:22 AM   HGB 8.6 (L) 06/28/2016 02:46 AM   HGB 12.4 06/23/2016 03:50 PM      Lipids: Lab Results  Component Value Date/Time   LDLCALC NOT CALC 04/14/2016 09:10 AM   CHOL 116 (L) 04/14/2016 09:10 AM   TRIG 439 (H) 04/14/2016 09:10 AM  HDL 24 (L) 04/14/2016 09:10 AM       ASSESSMENT AND PLAN: 1. CAD with prior PCI's: Symptomatically stable. Continue Lipitor, metoprolol, and Plavix.  2. Malignant HTN: Elevated. Increase Coreg to 25 mg bid. I suspect uncontrolled sleep apnea is driving this. She was not able to tolerate CPAP. I have asked her to speak with her sleep specialist about obtaining an oral mandibular advancement appliance.   3. Hyperlipidemia: Lipids from 04/14/16 reviewed (TC 116, TG 439, HDL 24, unable to calculate LDL) . Continue Lipitor 80 mg daily. Also takes fish oil.  4. Chronic diastolic heart failure: Euvolemic. Aim to control BP with increase of Coreg. Continue Lasix 40 mg daily.  5. Atrial fibrillation: Currently in a regularrhythm. Continue amiodarone and Eliquis as well as long-acting diltiazem.  6. Pacemaker: Normal function.  7. Pericardial effusion: Resolved. Echo reviewed above.  8. OSA: Likely driving hypertension. She was not able to tolerate CPAP. I have asked her to speak with her sleep specialist about obtaining an oral mandibular advancement appliance.     Disposition: Follow up 6 months.  Kate Sable, M.D., F.A.C.C.

## 2016-12-19 DIAGNOSIS — G4733 Obstructive sleep apnea (adult) (pediatric): Secondary | ICD-10-CM | POA: Diagnosis not present

## 2016-12-30 ENCOUNTER — Telehealth: Payer: Self-pay | Admitting: Cardiology

## 2016-12-30 ENCOUNTER — Ambulatory Visit (INDEPENDENT_AMBULATORY_CARE_PROVIDER_SITE_OTHER): Payer: PPO | Admitting: *Deleted

## 2016-12-30 DIAGNOSIS — I495 Sick sinus syndrome: Secondary | ICD-10-CM | POA: Diagnosis not present

## 2016-12-30 NOTE — Telephone Encounter (Signed)
Spoke with pt and reminded pt of remote transmission that is due today. Pt verbalized understanding.   

## 2016-12-30 NOTE — Progress Notes (Signed)
Remote pacemaker transmission.   

## 2017-01-05 ENCOUNTER — Encounter: Payer: Self-pay | Admitting: Cardiology

## 2017-01-07 ENCOUNTER — Telehealth: Payer: Self-pay | Admitting: "Endocrinology

## 2017-01-07 MED ORDER — METFORMIN HCL 500 MG PO TABS: 500.0000 mg | ORAL_TABLET | Freq: Two times a day (BID) | ORAL | 2 refills | Status: DC

## 2017-01-07 NOTE — Telephone Encounter (Signed)
Virginia Huber is calling is requesting a refill on metFORMIN (GLUCOPHAGE) 500 MG tablet please advise?

## 2017-01-11 DIAGNOSIS — E539 Vitamin B deficiency, unspecified: Secondary | ICD-10-CM | POA: Diagnosis not present

## 2017-01-13 LAB — CUP PACEART REMOTE DEVICE CHECK
Battery Remaining Longevity: 121 mo
Battery Voltage: 3.03 V
Brady Statistic AP VP Percent: 0.29 %
Brady Statistic AP VS Percent: 77.46 %
Brady Statistic AS VS Percent: 21.79 %
Implantable Lead Implant Date: 20180104
Implantable Lead Model: 5076
Implantable Pulse Generator Implant Date: 20180104
Lead Channel Impedance Value: 323 Ohm
Lead Channel Impedance Value: 418 Ohm
Lead Channel Pacing Threshold Amplitude: 0.625 V
Lead Channel Pacing Threshold Amplitude: 1.125 V
Lead Channel Pacing Threshold Pulse Width: 0.4 ms
Lead Channel Sensing Intrinsic Amplitude: 1.125 mV
Lead Channel Sensing Intrinsic Amplitude: 11.125 mV
Lead Channel Sensing Intrinsic Amplitude: 11.125 mV
MDC IDC LEAD IMPLANT DT: 20180104
MDC IDC LEAD LOCATION: 753859
MDC IDC LEAD LOCATION: 753860
MDC IDC MSMT LEADCHNL RA PACING THRESHOLD PULSEWIDTH: 0.4 ms
MDC IDC MSMT LEADCHNL RA SENSING INTR AMPL: 1.125 mV
MDC IDC MSMT LEADCHNL RV IMPEDANCE VALUE: 323 Ohm
MDC IDC MSMT LEADCHNL RV IMPEDANCE VALUE: 399 Ohm
MDC IDC SESS DTM: 20180711161613
MDC IDC SET LEADCHNL RA PACING AMPLITUDE: 2 V
MDC IDC SET LEADCHNL RV PACING AMPLITUDE: 2.5 V
MDC IDC SET LEADCHNL RV PACING PULSEWIDTH: 0.4 ms
MDC IDC SET LEADCHNL RV SENSING SENSITIVITY: 2.8 mV
MDC IDC STAT BRADY AS VP PERCENT: 0.45 %
MDC IDC STAT BRADY RA PERCENT PACED: 75.9 %
MDC IDC STAT BRADY RV PERCENT PACED: 0.87 %

## 2017-01-14 DIAGNOSIS — H34831 Tributary (branch) retinal vein occlusion, right eye, with macular edema: Secondary | ICD-10-CM | POA: Diagnosis not present

## 2017-01-19 ENCOUNTER — Encounter: Payer: Self-pay | Admitting: Cardiology

## 2017-01-19 DIAGNOSIS — G4733 Obstructive sleep apnea (adult) (pediatric): Secondary | ICD-10-CM | POA: Diagnosis not present

## 2017-01-25 ENCOUNTER — Telehealth: Payer: Self-pay | Admitting: *Deleted

## 2017-01-25 DIAGNOSIS — E039 Hypothyroidism, unspecified: Secondary | ICD-10-CM

## 2017-01-25 LAB — COMPLETE METABOLIC PANEL WITH GFR
ALK PHOS: 62 U/L (ref 33–130)
ALT: 17 U/L (ref 6–29)
AST: 12 U/L (ref 10–35)
Albumin: 4.4 g/dL (ref 3.6–5.1)
BUN: 16 mg/dL (ref 7–25)
CHLORIDE: 103 mmol/L (ref 98–110)
CO2: 25 mmol/L (ref 20–32)
Calcium: 9.3 mg/dL (ref 8.6–10.4)
Creat: 1.07 mg/dL — ABNORMAL HIGH (ref 0.60–0.93)
GFR, EST NON AFRICAN AMERICAN: 52 mL/min — AB (ref >=60)
GFR, Est African American: 60 mL/min (ref >=60)
GLUCOSE: 178 mg/dL — AB (ref 65–99)
POTASSIUM: 4.6 mmol/L (ref 3.5–5.3)
SODIUM: 139 mmol/L (ref 135–146)
Total Bilirubin: 0.6 mg/dL (ref 0.2–1.2)
Total Protein: 6.4 g/dL (ref 6.1–8.1)

## 2017-01-25 LAB — TSH: TSH: 5.64 m[IU]/L — AB

## 2017-01-25 LAB — T4, FREE: Free T4: 2.1 ng/dL — ABNORMAL HIGH (ref 0.8–1.8)

## 2017-01-25 NOTE — Telephone Encounter (Signed)
Lab called patient is there to get lab drawn & labs not in. Placing orders.

## 2017-01-26 LAB — HEMOGLOBIN A1C
HEMOGLOBIN A1C: 6.9 % — AB (ref <5.7)
MEAN PLASMA GLUCOSE: 151 mg/dL

## 2017-02-01 ENCOUNTER — Encounter: Payer: Self-pay | Admitting: "Endocrinology

## 2017-02-01 ENCOUNTER — Encounter: Payer: Self-pay | Admitting: Cardiology

## 2017-02-01 ENCOUNTER — Ambulatory Visit (INDEPENDENT_AMBULATORY_CARE_PROVIDER_SITE_OTHER): Payer: PPO | Admitting: "Endocrinology

## 2017-02-01 ENCOUNTER — Encounter: Payer: PPO | Attending: Family Medicine | Admitting: Nutrition

## 2017-02-01 VITALS — Ht 63.0 in | Wt 157.0 lb

## 2017-02-01 VITALS — BP 128/66 | HR 83 | Ht 63.0 in | Wt 157.0 lb

## 2017-02-01 DIAGNOSIS — Z713 Dietary counseling and surveillance: Secondary | ICD-10-CM | POA: Diagnosis not present

## 2017-02-01 DIAGNOSIS — E1159 Type 2 diabetes mellitus with other circulatory complications: Secondary | ICD-10-CM

## 2017-02-01 DIAGNOSIS — E669 Obesity, unspecified: Secondary | ICD-10-CM

## 2017-02-01 DIAGNOSIS — E118 Type 2 diabetes mellitus with unspecified complications: Secondary | ICD-10-CM

## 2017-02-01 DIAGNOSIS — E039 Hypothyroidism, unspecified: Secondary | ICD-10-CM

## 2017-02-01 DIAGNOSIS — E1165 Type 2 diabetes mellitus with hyperglycemia: Secondary | ICD-10-CM

## 2017-02-01 DIAGNOSIS — I10 Essential (primary) hypertension: Secondary | ICD-10-CM

## 2017-02-01 DIAGNOSIS — E782 Mixed hyperlipidemia: Secondary | ICD-10-CM | POA: Diagnosis not present

## 2017-02-01 DIAGNOSIS — IMO0002 Reserved for concepts with insufficient information to code with codable children: Secondary | ICD-10-CM

## 2017-02-01 DIAGNOSIS — Z794 Long term (current) use of insulin: Secondary | ICD-10-CM

## 2017-02-01 NOTE — Progress Notes (Signed)
Subjective:    Patient ID: Virginia Huber, female    DOB: 29-Mar-1946. Patient is being seen in f/u for management of diabetes requested by  Sharilyn Sites, MD  Past Medical History:  Diagnosis Date  . Arthritis   . Atrial fibrillation and flutter ([place])    a. h/o PAF/flutter during admission in 2013 for PNA. b. PAF during adm for NSTEMI 07/2015, subsequent paroxysms since then.  . B12 deficiency anemia   . Cardiac tamponade 06/2016  . Coronary artery disease 11/30/2014   a. remote MI. b. h/o PTCA with scoring balloon to OM1 11/2014. c. NSTEMI 03/2015 s/p DES to prox-mid Cx. d. NSTEMI 07/2015 s/p scoring balloon/PTCA/DES to dRCA with PAF during that admission  . Cutaneous lupus erythematosus   . GERD (gastroesophageal reflux disease)   . Heart block   . History of blood transfusion 1980's   2nd surgical procedures  . HTN (hypertension)   . Hypercholesteremia   . Hypothyroidism   . Myocardial infarction ([place]) 02/2012  . Ovarian tumor   . PAD (peripheral artery disease) ([place])    a. s/p LE angio 2015; followed by Dr. Fletcher Anon - managed medically.  . Pain with urination 05/08/2015  . Paroxysmal atrial flutter ([place])   . Pericardial effusion    a. 06/2016 after ppm - s/p pericardiocentesis.  . Superficial fungus infection of skin 06/29/2013  . Tachy-brady syndrome ([place])    a. s/p Medtronic PPM 06/2016, c/b lead perf/pericardial effusion.  [name] TIA (transient ischemic attack) 08/2001; ~ 2006  . Type II diabetes mellitus ([place])   . UTI (urinary tract infection) 05/08/2013   Past Surgical History:  Procedure Laterality Date  . ABDOMINAL AORTAGRAM N/A 01/03/2014   Procedure: ABDOMINAL [name];  Surgeon: Wellington Hampshire, MD;  Location: [facility];  Service: Cardiovascular;  Laterality: N/A;  . ABDOMINAL HYSTERECTOMY  1972   "partial"  . APPENDECTOMY  1970's  . CARDIAC CATHETERIZATION  2008   Tiny OM-2 with 90% narrowing. Med tx.  [name] CARDIAC CATHETERIZATION N/A 11/30/2014   Procedure: Left  Heart Cath and Coronary Angiography;  Surgeon: Troy Sine, MD; LAD 20%, CFX 50%, OM1 95%, right PLB 30%, LV normal   . CARDIAC CATHETERIZATION N/A 11/30/2014   Procedure: Coronary Balloon Angioplasty;  Surgeon: Troy Sine, MD;  Angiosculpt scoring balloon and PTCA to the OM1 reducing stenosis from 95% to less than 10%  . CARDIAC CATHETERIZATION N/A 04/03/2015   Procedure: Left Heart Cath and Coronary Angiography;  Surgeon: Jolaine Artist, MD; dLAD 50%, CFX 90%, OM1 100%, PLA 15%, LVEDP 13    . CARDIAC CATHETERIZATION N/A 04/03/2015   Procedure: Coronary Stent Intervention;  Surgeon: Sherren Mocha, MD; 3.0x18 mm Xience DES to the CFX    . CARDIAC CATHETERIZATION N/A 08/02/2015   Procedure: Left Heart Cath and Coronary Angiography;  Surgeon: Troy Sine, MD;  Location: [facility];  Service: Cardiovascular;  Laterality: N/A;  . CARDIAC CATHETERIZATION N/A 08/02/2015   Procedure: Coronary Stent Intervention;  Surgeon: Troy Sine, MD;  Location: [facility];  Service: Cardiovascular;  Laterality: N/A;  . CARDIAC CATHETERIZATION N/A 06/25/2016   Procedure: Pericardiocentesis;  Surgeon: Will Meredith Leeds, MD;  Location: [facility];  Service: Cardiovascular;  Laterality: N/A;  . cardiac stents    . CHOLECYSTECTOMY OPEN  1990's  . COLONOSCOPY  2005   Dr. Laural Golden: pancolonic divericula, polyp, path unknown currently  . COLONOSCOPY  2012   Dr. Oneida Alar: Normal  TI, scattered diverticula in entire colon, small internal hemorrhoids, normal colon biopsies. Colonoscopy in 5-10 years.   . COLOSTOMY  05/1979  . COLOSTOMY REVERSAL  11/1979  . EP IMPLANTABLE DEVICE N/A 06/25/2016   Procedure: Lead Revision/Repair;  Surgeon: Will Meredith Leeds, MD;  Location: [facility];  Service: Cardiovascular;  Laterality: N/A;  . EP IMPLANTABLE DEVICE N/A 06/25/2016   Procedure: Pacemaker Implant;  Surgeon: Will Meredith Leeds, MD;  Location: [facility];  Service:  Cardiovascular;  Laterality: N/A;  . EXCISIONAL HEMORRHOIDECTOMY  1970's  . EYE SURGERY Left 2000   "branch vein occlusion"  . EYE SURGERY Left ~ 2001   "smoothed out wrinkle"  . LEFT OOPHORECTOMY  05/1979   nicked bowel, peritonitis, colostomy; colostomy reversed 1981   . LOWER EXTREMITY ANGIOGRAM N/A 01/03/2014   Procedure: LOWER EXTREMITY ANGIOGRAM;  Surgeon: Wellington Hampshire, MD;  Location: [facility];  Service: Cardiovascular;  Laterality: N/A;  . Nuclear med stress test  10/2011   Small area of mild ischemia inferoapically.  [name] PARTIAL HYSTERECTOMY  1970's   left ovaries, then ovaries removed later due tumors   . RIGHT OOPHORECTOMY  1970's   Social History   Social History  . Marital status: Married    Spouse name: N/A  . Number of children: N/A  . Years of education: N/A   Occupational History  . Retired Retired    Research officer, political party   Social History Main Topics  . Smoking status: Never Smoker  . Smokeless tobacco: Never Used     Comment: Never smoked  . Alcohol use No  . Drug use: No  . Sexual activity: No     Comment: hyst   Other Topics Concern  . None   Social History Narrative  . None   Outpatient Encounter Prescriptions as of 02/01/2017  Medication Sig  . acetaminophen (TYLENOL) 325 MG tablet Take 650 mg by mouth every 6 (six) hours as needed for headache.  [name] amiodarone (PACERONE) 200 MG tablet Take 1 tablet (200 mg total) by mouth daily.  [name] apixaban (ELIQUIS) 5 MG TABS tablet Take 1 tablet (5 mg total) by mouth 2 (two) times daily.  [name] atorvastatin (LIPITOR) 80 MG tablet Take 1 tablet (80 mg total) by mouth daily at 6 PM.  . carvedilol (COREG) 25 MG tablet Take 1 tablet (25 mg total) by mouth 2 (two) times daily.  . cholecalciferol (VITAMIN D) 1000 UNITS tablet Take 1,000 Units by mouth at bedtime.   . clopidogrel (PLAVIX) 75 MG tablet TAKE ONE (1) TABLET BY MOUTH EVERY DAY  . diltiazem (CARDIZEM CD) 360 MG 24 hr capsule Take 1 capsule (360 mg total) by  mouth daily.  . diphenhydramine-acetaminophen (TYLENOL PM EXTRA STRENGTH) 25-500 MG TABS tablet Take 1 tablet by mouth at bedtime as needed (Take as directed).   . furosemide (LASIX) 40 MG tablet TAKE ONE TABLET BY MOUTH ONCE DAILY  . glucose blood (FREESTYLE LITE) test strip As directed bid. E11.65  . hydrALAZINE (APRESOLINE) 50 MG tablet Take 2 tablets (100 mg total) by mouth 3 (three) times daily.  . insulin NPH-regular Human (NOVOLIN 70/30) (70-30) 100 UNIT/ML injection Inject 30 Units into the skin See admin instructions. Inject 30 units before breakfast and 30 units before supper  - only if pre-meal blood glucose is above 90 mg/dL.  [name] levothyroxine (SYNTHROID, LEVOTHROID) 137 MCG tablet Take 1 tablet (137 mcg total) by mouth daily before breakfast.  . losartan (COZAAR) 100 MG  tablet TAKE ONE (1) TABLET BY MOUTH EVERY DAY  . magnesium oxide (MAG-OX) 400 MG tablet Take 400 mg by mouth daily.  . metFORMIN (GLUCOPHAGE) 500 MG tablet Take 1 tablet (500 mg total) by mouth 2 (two) times daily with a meal.  . nitroGLYCERIN (NITROSTAT) 0.4 MG SL tablet Place 1 tablet (0.4 mg total) under the tongue every 5 (five) minutes as needed for chest pain.  . Omega-3 Fatty Acids (FISH OIL) 1200 MG CAPS Take 2 capsules (2,400 mg total) by mouth 2 (two) times daily.  . potassium chloride SA (K-DUR,KLOR-CON) 20 MEQ tablet TAKE ONE (1) TABLET BY MOUTH EVERY DAY  . rOPINIRole (REQUIP) 0.5 MG tablet Take 0.5 mg by mouth at bedtime.    No facility-administered encounter medications on file as of 02/01/2017.    ALLERGIES: VACCINATION STATUS: Immunization History  Administered Date(s) Administered  . Influenza-Unspecified 03/27/2015  . Pneumococcal-Unspecified 02/21/2012    Diabetes  She presents for her follow-up diabetic visit. She has type 2 diabetes mellitus. Onset time: She was diagnosed at approximate age of 3 years. Her disease course has been improving. There are no hypoglycemic associated symptoms.  Pertinent negatives for hypoglycemia include no confusion, headaches, pallor, seizures or tremors. Associated symptoms include fatigue. Pertinent negatives for diabetes include no chest pain, no polydipsia, no polyphagia and no polyuria. There are no hypoglycemic complications. Symptoms are improving. Diabetic complications include a CVA and heart disease. Risk factors for coronary artery disease include diabetes mellitus, dyslipidemia, hypertension and sedentary lifestyle. Current diabetic treatments: She is on Novolin 70/30 60 units twice a day, metformin 500 mg 3 times a day. She is compliant with treatment most of the time. Her weight is increasing steadily. She is following a generally unhealthy diet. When asked about meal planning, she reported none. She has had a previous visit with a dietitian. She participates in exercise intermittently. Home blood sugar record trend: She did not bring the meter to review today. She mentions that she is monitoring randomly and regular.[name] Her breakfast blood glucose range is generally 130-140 mg/dl. Her dinner blood glucose range is generally 110-130 mg/dl. Her overall blood glucose range is 130-140 mg/dl. An ACE inhibitor/angiotensin II receptor blocker is being taken. Eye exam is current.  Hyperlipidemia  This is a chronic problem. The current episode started more than 1 year ago. The problem is uncontrolled. Recent lipid tests were reviewed and are high. Exacerbating diseases include diabetes and hypothyroidism. Pertinent negatives include no chest pain, myalgias or shortness of breath. Current antihyperlipidemic treatment includes statins, fibric acid derivatives and bile acid squestrants. The current treatment provides moderate improvement of lipids.  Hypertension  This is a chronic problem. The current episode started more than 1 year ago. The problem is uncontrolled. Pertinent negatives include no chest pain, headaches, palpitations or shortness of breath. Risk  factors for coronary artery disease include dyslipidemia, diabetes mellitus and sedentary lifestyle. Past treatments include angiotensin blockers. Hypertensive end-organ damage includes CAD/MI and CVA.     Review of Systems  Constitutional: Positive for fatigue. Negative for chills, fever and unexpected weight change.  HENT: Negative for trouble swallowing and voice change.   Eyes: Negative for visual disturbance.  Respiratory: Negative for cough, shortness of breath and wheezing.   Cardiovascular: Negative for chest pain, palpitations and leg swelling.       No Shortness of breath  Gastrointestinal: Negative for abdominal pain, diarrhea, nausea and vomiting.  Endocrine: Negative for cold intolerance, heat intolerance, polydipsia, polyphagia and polyuria.  Genitourinary:  Negative for frequency, hematuria and urgency.  Musculoskeletal: Negative for arthralgias and myalgias.  Skin: Negative for color change, pallor, rash and wound.  Neurological: Negative for tremors, seizures and headaches.  Hematological: Does not bruise/bleed easily.  Psychiatric/Behavioral: Negative for confusion, hallucinations and suicidal ideas.    Objective:    BP 128/66   Pulse 83   Ht 5\' 3"  (1.6 m)   Wt 157 lb (71.2 kg)   BMI 27.81 kg/m   Wt Readings from Last 3 Encounters:  02/01/17 157 lb (71.2 kg)  12/17/16 151 lb 9.6 oz (68.8 kg)  10/29/16 149 lb (67.6 kg)    Physical Exam  Constitutional: She is oriented to person, place, and time. She appears well-developed.  HENT:  Head: Normocephalic and atraumatic.  Eyes: EOM are normal.  Neck: Normal range of motion. Neck supple. No tracheal deviation present. No thyromegaly present.  Cardiovascular: Normal rate and regular rhythm.   Pulmonary/Chest: Effort normal and breath sounds normal.  Abdominal: Soft. Bowel sounds are normal. There is no tenderness. There is no guarding.  Musculoskeletal: Normal range of motion. She exhibits no edema.  Neurological:  She is alert and oriented to person, place, and time. She has normal reflexes. No cranial nerve deficit. Coordination normal.  Skin: Skin is warm and dry. No rash noted. No erythema. No pallor.  Psychiatric: She has a normal mood and affect. Judgment normal.    CMP     Component Value Date/Time   NA 139 01/25/2017 0922   NA 140 06/23/2016 1550   K 4.6 01/25/2017 0922   CL 103 01/25/2017 0922   CO2 25 01/25/2017 0922   GLUCOSE 178 (H) 01/25/2017 0922   BUN 16 01/25/2017 0922   BUN 21 06/23/2016 1550   CREATININE 1.07 (H) 01/25/2017 0922   CALCIUM 9.3 01/25/2017 0922   PROT 6.4 01/25/2017 0922   ALBUMIN 4.4 01/25/2017 0922   AST 12 01/25/2017 0922   ALT 17 01/25/2017 0922   ALKPHOS 62 01/25/2017 0922   BILITOT 0.6 01/25/2017 0922   GFRNONAA 52 (L) 01/25/2017 0922   GFRAA 60 01/25/2017 0922     Diabetic Labs (most recent): Lab Results  Component Value Date   HGBA1C 6.9 (H) 01/25/2017   HGBA1C 7.1 (H) 10/22/2016   HGBA1C 6.3 (H) 07/21/2016     Lipid Panel ( most recent) Lipid Panel     Component Value Date/Time   CHOL 116 (L) 04/14/2016 0910   TRIG 439 (H) 04/14/2016 0910   HDL 24 (L) 04/14/2016 0910   CHOLHDL 4.8 04/14/2016 0910   VLDL NOT CALC 04/14/2016 0910   LDLCALC NOT CALC 04/14/2016 0910      Assessment & Plan:   1. DM type 2 causing vascular disease ([place]) - Patient has currently uncontrolled symptomatic type 2 DM since  71 years of age. - She came with Continued improvement in her glucose profile and her A1c  Is improved to 6.9% from  7.1%.  - I have reviewed her most recent labs.  -Her diabetes is complicated by coronary artery disease which is recurrent requiring stent placement , CVA, and patient remains at a high risk for more acute and chronic complications of diabetes which include CAD, CVA, CKD, retinopathy, and neuropathy. These are all discussed in detail with the patient.  - I have counseled the patient on diet management and weight loss, by  adopting a carbohydrate restricted/protein rich diet.  - Suggestion is made for patient to avoid simple carbohydrates   from  her diet including Cakes , Desserts, Ice Cream,  Soda (  diet and regular) , Sweet Tea , Candies,  Chips, Cookies, Artificial Sweeteners,   and "Sugar-free" Products . This will help patient to have stable blood glucose profile and potentially avoid unintended weight gain.  - I encouraged the patient to switch to  unprocessed or minimally processed complex starch and increased protein intake (animal or plant source), fruits, and vegetables.  - Patient is advised to stick to a routine mealtimes to eat 3 meals  a day and avoid unnecessary snacks ( to snack only to correct hypoglycemia).   - I have approached patient with the following individualized plan to manage diabetes and patient agrees:   - Due to cost reasons , and because of the fact that she is responding appropriately, I will keep her on  Novolin 70/30. - I will lower Novolin 70/30  to  25  units with breakfast and 25 units with supper when pre-meal blood glucose is above 90 mg/dL. - She is advised to start strict monitoring of glucose   2 times a day-before breakfast and at bedtime and as needed.  - If her blood glucose profile is significantly abnormal, she will be considered for insulin analogs. - Patient is warned not to take insulin without proper monitoring per orders.  -Patient is encouraged to call clinic for blood glucose levels less than 70 or above 300 mg /dl. - I will continue metformin  500 mg by mouth twice a day  ,  still therapeutically suitable for patient.   - Patient specific target  A1c;  LDL, HDL, Triglycerides, and  Waist Circumference were discussed in detail.  2) BP/HTN: Uncontrolled. Continue current medications including ACEI/ARB. 3) Lipids/HPL:  Uncontrolled with triglycerides of 439, LDL was not calculated. I advised her to continue statins and increase her fish oil to 2 capsules twice  a day. 4)  Weight/Diet: CDE Consult has been initiated , exercise, and detailed carbohydrates information provided.   5) hypothyroidism: Long-standing for at least 20 years.  - Her thyroid function tests are conflicting with high free T4 of 2.1 (unchanged from last visit) and higher TSH of 5.64.  - Her levothyroxine was adjusted last visit to 137 g by mouth every morning.  - I advised her to continue on the same dose for now.    - We discussed about correct intake of levothyroxine, at fasting, with water, separated by at least 30 minutes from breakfast, and separated by more than 4 hours from calcium, iron, multivitamins, acid reflux medications (PPIs). -Patient is made aware of the fact that thyroid hormone replacement is needed for life, dose to be adjusted by periodic monitoring of thyroid function tests.  6) Chronic Care/Health Maintenance:  -Patient is on ACEI/ARB and Statin medications and encouraged to continue to follow up with Ophthalmology, Podiatrist at least yearly or according to recommendations, and advised to  stay away from smoking. I have recommended yearly flu vaccine and pneumonia vaccination at least every 5 years; moderate intensity exercise for up to 150 minutes weekly; and  sleep for at least 7 hours a day.  - Patient to bring meter and  blood glucose logs during their next visit.  - I advised patient to maintain close follow up with Sharilyn Sites, MD for primary care needs.  Follow up plan: - Return in about 3 months (around 05/04/2017) for follow up with pre-visit labs, meter, and logs.  Glade Lloyd, MD Phone: [phone]  Fax: [phone]  02/01/2017, 11:20 AM

## 2017-02-01 NOTE — Progress Notes (Signed)
  Diabetes Self-Management Education  Visit Type:  Follow up.  Appt. Start Time: 1100 Appt. End Time: 1130  02/01/2017  [name], identified by name and date of birth, is a 71 y.o. female with a diagnosis of Diabetes: Type 2.  A1C down to 6.9% from 7.1%.  Taking 25 units of 70/30 insulin twice a day down from 30 units and Metformin 500 mg BID. Had a few low blood sugars these past few weeks before lunch mostly.     Eating much better and watching portions. Feels better. Gained a few pounds. Has been stressed over losing her vision. Gets on exercise bike for exercise and rides for about a mile/6 mins twice a day.   Drinking lots of water and unsweet tea. Lab Results  Component Value Date   HGBA1C 6.9 (H) 01/25/2017    ASSESSMENT  Height 5\' 3"  (1.6 m), weight 157 lb (71.2 kg). Body mass index is 27.81 kg/m. Wt Readings from Last 3 Encounters:  02/01/17 157 lb (71.2 kg)  02/01/17 157 lb (71.2 kg)  12/17/16 151 lb 9.6 oz (68.8 kg)   Ht Readings from Last 3 Encounters:  02/01/17 5\' 3"  (1.6 m)  02/01/17 5\' 3"  (1.6 m)  12/17/16 5\' 3"  (1.6 m)   Body mass index is 27.81 kg/m.    Learning Objective:  Patient will have a greater understanding of diabetes self-management. Patient education plan is to attend individual and/or group sessions per assessed needs and concerns.   Plan:   Goals 1. Eat 30-45 grams of carbs at each meal with protein to prevent low blood sugars 2. Keep riding exercise bike 1 mile twice a day or more if tolerated Stress eat on veggies   Expected Outcomes:     Education material provided: My Plate and Carbohydrate counting sheet  If problems or questions, patient to contact team via:  Phone and Email  Future DSME appointment: -   78months

## 2017-02-01 NOTE — Patient Instructions (Signed)

## 2017-02-01 NOTE — Patient Instructions (Signed)
Goals 1. Eat 30-45 grams of carbs at each meal with protein to prevent low blood sugars 2. Keep riding exercise bike 1 mile twice a day or more if tolerated Stress eat on veggies

## 2017-02-10 ENCOUNTER — Other Ambulatory Visit: Payer: Self-pay

## 2017-02-10 DIAGNOSIS — E539 Vitamin B deficiency, unspecified: Secondary | ICD-10-CM | POA: Diagnosis not present

## 2017-02-10 MED ORDER — METFORMIN HCL 500 MG PO TABS: 500.0000 mg | ORAL_TABLET | Freq: Two times a day (BID) | ORAL | 0 refills | Status: DC

## 2017-02-11 DIAGNOSIS — E539 Vitamin B deficiency, unspecified: Secondary | ICD-10-CM | POA: Diagnosis not present

## 2017-02-18 DIAGNOSIS — E113411 Type 2 diabetes mellitus with severe nonproliferative diabetic retinopathy with macular edema, right eye: Secondary | ICD-10-CM | POA: Diagnosis not present

## 2017-02-18 DIAGNOSIS — H348322 Tributary (branch) retinal vein occlusion, left eye, stable: Secondary | ICD-10-CM | POA: Diagnosis not present

## 2017-02-18 DIAGNOSIS — H34831 Tributary (branch) retinal vein occlusion, right eye, with macular edema: Secondary | ICD-10-CM | POA: Diagnosis not present

## 2017-02-18 DIAGNOSIS — H43811 Vitreous degeneration, right eye: Secondary | ICD-10-CM | POA: Diagnosis not present

## 2017-02-19 DIAGNOSIS — G4733 Obstructive sleep apnea (adult) (pediatric): Secondary | ICD-10-CM | POA: Diagnosis not present

## 2017-02-23 DIAGNOSIS — E113411 Type 2 diabetes mellitus with severe nonproliferative diabetic retinopathy with macular edema, right eye: Secondary | ICD-10-CM | POA: Diagnosis not present

## 2017-02-24 ENCOUNTER — Other Ambulatory Visit: Payer: Self-pay | Admitting: Cardiovascular Disease

## 2017-02-24 ENCOUNTER — Other Ambulatory Visit: Payer: Self-pay | Admitting: "Endocrinology

## 2017-03-04 ENCOUNTER — Ambulatory Visit: Payer: PPO | Admitting: Adult Health

## 2017-03-05 DIAGNOSIS — I2510 Atherosclerotic heart disease of native coronary artery without angina pectoris: Secondary | ICD-10-CM | POA: Diagnosis not present

## 2017-03-05 DIAGNOSIS — E539 Vitamin B deficiency, unspecified: Secondary | ICD-10-CM | POA: Diagnosis not present

## 2017-03-05 DIAGNOSIS — E782 Mixed hyperlipidemia: Secondary | ICD-10-CM | POA: Diagnosis not present

## 2017-03-05 DIAGNOSIS — E119 Type 2 diabetes mellitus without complications: Secondary | ICD-10-CM | POA: Diagnosis not present

## 2017-03-05 DIAGNOSIS — I10 Essential (primary) hypertension: Secondary | ICD-10-CM | POA: Diagnosis not present

## 2017-03-05 DIAGNOSIS — E039 Hypothyroidism, unspecified: Secondary | ICD-10-CM | POA: Diagnosis not present

## 2017-03-05 DIAGNOSIS — Z6828 Body mass index (BMI) 28.0-28.9, adult: Secondary | ICD-10-CM | POA: Diagnosis not present

## 2017-03-05 DIAGNOSIS — Z23 Encounter for immunization: Secondary | ICD-10-CM | POA: Diagnosis not present

## 2017-03-12 DIAGNOSIS — D510 Vitamin B12 deficiency anemia due to intrinsic factor deficiency: Secondary | ICD-10-CM | POA: Diagnosis not present

## 2017-03-21 DIAGNOSIS — G4733 Obstructive sleep apnea (adult) (pediatric): Secondary | ICD-10-CM | POA: Diagnosis not present

## 2017-03-22 DIAGNOSIS — H33301 Unspecified retinal break, right eye: Secondary | ICD-10-CM | POA: Diagnosis not present

## 2017-03-22 DIAGNOSIS — H348322 Tributary (branch) retinal vein occlusion, left eye, stable: Secondary | ICD-10-CM | POA: Diagnosis not present

## 2017-03-22 DIAGNOSIS — H34831 Tributary (branch) retinal vein occlusion, right eye, with macular edema: Secondary | ICD-10-CM | POA: Diagnosis not present

## 2017-03-22 DIAGNOSIS — E113411 Type 2 diabetes mellitus with severe nonproliferative diabetic retinopathy with macular edema, right eye: Secondary | ICD-10-CM | POA: Diagnosis not present

## 2017-03-24 ENCOUNTER — Encounter: Payer: PPO | Admitting: Cardiology

## 2017-03-25 ENCOUNTER — Encounter: Payer: Self-pay | Admitting: Cardiology

## 2017-03-25 ENCOUNTER — Ambulatory Visit (INDEPENDENT_AMBULATORY_CARE_PROVIDER_SITE_OTHER): Payer: PPO | Admitting: Cardiology

## 2017-03-25 VITALS — BP 116/56 | HR 79 | Ht 63.0 in | Wt 158.0 lb

## 2017-03-25 DIAGNOSIS — R001 Bradycardia, unspecified: Secondary | ICD-10-CM | POA: Diagnosis not present

## 2017-03-25 DIAGNOSIS — I4891 Unspecified atrial fibrillation: Secondary | ICD-10-CM

## 2017-03-25 DIAGNOSIS — I2510 Atherosclerotic heart disease of native coronary artery without angina pectoris: Secondary | ICD-10-CM | POA: Diagnosis not present

## 2017-03-25 LAB — CUP PACEART INCLINIC DEVICE CHECK
Brady Statistic AP VP Percent: 0.27 %
Brady Statistic AS VP Percent: 0.25 %
Brady Statistic RA Percent Paced: 84.17 %
Brady Statistic RV Percent Paced: 0.64 %
Date Time Interrogation Session: 20181004122603
Implantable Lead Implant Date: 20180104
Implantable Lead Location: 753859
Implantable Lead Location: 753860
Implantable Lead Model: 5076
Implantable Pulse Generator Implant Date: 20180104
Lead Channel Impedance Value: 323 Ohm
Lead Channel Impedance Value: 399 Ohm
Lead Channel Pacing Threshold Pulse Width: 0.4 ms
Lead Channel Sensing Intrinsic Amplitude: 12.375 mV
Lead Channel Setting Pacing Amplitude: 2 V
Lead Channel Setting Pacing Amplitude: 2.5 V
Lead Channel Setting Pacing Pulse Width: 0.4 ms
Lead Channel Setting Sensing Sensitivity: 2.8 mV
MDC IDC LEAD IMPLANT DT: 20180104
MDC IDC MSMT BATTERY REMAINING LONGEVITY: 107 mo
MDC IDC MSMT BATTERY VOLTAGE: 3.02 V
MDC IDC MSMT LEADCHNL RA IMPEDANCE VALUE: 323 Ohm
MDC IDC MSMT LEADCHNL RA IMPEDANCE VALUE: 456 Ohm
MDC IDC MSMT LEADCHNL RA PACING THRESHOLD AMPLITUDE: 0.625 V
MDC IDC MSMT LEADCHNL RA SENSING INTR AMPL: 0.875 mV
MDC IDC MSMT LEADCHNL RA SENSING INTR AMPL: 2 mV
MDC IDC MSMT LEADCHNL RV PACING THRESHOLD AMPLITUDE: 1 V
MDC IDC MSMT LEADCHNL RV PACING THRESHOLD PULSEWIDTH: 0.4 ms
MDC IDC MSMT LEADCHNL RV SENSING INTR AMPL: 11 mV
MDC IDC STAT BRADY AP VS PERCENT: 85.4 %
MDC IDC STAT BRADY AS VS PERCENT: 14.09 %

## 2017-03-25 MED ORDER — CARVEDILOL 12.5 MG PO TABS: 12.5000 mg | ORAL_TABLET | Freq: Two times a day (BID) | ORAL | 3 refills | Status: DC

## 2017-03-25 NOTE — Patient Instructions (Addendum)
Medication Instructions:  Your physician has recommended you make the following change in your medication:   1.) Decrease carvedilol to 12.5mg  bid.  -- If you need a refill on your cardiac medications before your next appointment, please call your pharmacy. --  Labwork: None ordered  Testing/Procedures: None ordered  Follow-Up: Remote monitoring is used to monitor your Pacemaker of ICD from home. This monitoring reduces the number of office visits required to check your device to one time per year. It allows [place] to keep an eye on the functioning of your device to ensure it is working properly. You are scheduled for a device check from home on 06/24/2017. You may send your transmission at any time that day. If you have a wireless device, the transmission will be sent automatically. After your physician reviews your transmission, you will receive a postcard with your next transmission date.    Your physician wants you to follow-up in: 1 year with Dr. Curt Bears.  You will receive a reminder letter in the mail two months in advance. If you don't receive a letter, please call our office to schedule the follow-up appointment.  Thank you for choosing [facility]!!   Frederik Schmidt, RN [phone]  Any Other Special Instructions Will Be Listed Below (If Applicable).

## 2017-03-25 NOTE — Progress Notes (Signed)
Electrophysiology Office Note   Date:  03/25/2017   ID:  Aahna, Rossa 01/23/1946, MRN [id number]  PCP:  Sharilyn Sites, MD  Cardiologist:  Bronson Ing Primary Electrophysiologist:  Babbette Dalesandro Meredith Leeds, MD    Chief Complaint  Patient presents with  . Pacemaker Check    PAF/Tachy-Brady syndrome     History of Present Illness: Virginia Huber is a 71 y.o. female who presents today for electrophysiology evaluation.   She has a history of CAD status post MI and PCI of the OM1, with an an STEMI 03/2015 status post DES to the circumflex, an STEMI 07/2015 with PCI to the RCA and paroxysmal atrial fibrillation during that admission. She is on Eliquis. Sleep study showed mild sleep apnea. He was in the hospital on 12/17 and was found to be in A. fib with RVR. She converted to sinus rhythm after arrival. He was readmitted to the hospital on 12/23 and was noted to be in atrial flutter with rapid rates at that time. She had junctional rhythm and near-syncope after conversion to sinus rhythm. She had a postconversion pause of 5.6 seconds. Pacemaker placed 06/25/16 with lead revision 06/26/16. Device implant was complicated by pericardial effusion and tamponade requiring emergent pericardiocentesis.  She has been having some fatigue and shortness of breath over the last she is not sleeping well at night. She does take short naps during the day. Her shortness of breath is with exertion and also when she lies flat.  Today, denies symptoms of palpitations, chest pain, lower extremity edema, claudication, dizziness, presyncope, syncope, bleeding, or neurologic sequela. The patient is tolerating medications without difficulties.     Past Medical History:  Diagnosis Date  . Arthritis   . Atrial fibrillation and flutter ([place])    a. h/o PAF/flutter during admission in 2013 for PNA. b. PAF during adm for NSTEMI 07/2015, subsequent paroxysms since then.  . B12 deficiency anemia   . Cardiac tamponade 06/2016   . Coronary artery disease 11/30/2014   a. remote MI. b. h/o PTCA with scoring balloon to OM1 11/2014. c. NSTEMI 03/2015 s/p DES to prox-mid Cx. d. NSTEMI 07/2015 s/p scoring balloon/PTCA/DES to dRCA with PAF during that admission  . Cutaneous lupus erythematosus   . GERD (gastroesophageal reflux disease)   . Heart block   . History of blood transfusion 1980's   2nd surgical procedures  . HTN (hypertension)   . Hypercholesteremia   . Hypothyroidism   . Myocardial infarction ([place]) 02/2012  . Ovarian tumor   . PAD (peripheral artery disease) ([place])    a. s/p LE angio 2015; followed by Dr. Fletcher Anon - managed medically.  . Pain with urination 05/08/2015  . Paroxysmal atrial flutter ([place])   . Pericardial effusion    a. 06/2016 after ppm - s/p pericardiocentesis.  . Superficial fungus infection of skin 06/29/2013  . Tachy-brady syndrome ([place])    a. s/p Medtronic PPM 06/2016, c/b lead perf/pericardial effusion.  [name] TIA (transient ischemic attack) 08/2001; ~ 2006  . Type II diabetes mellitus ([place])   . UTI (urinary tract infection) 05/08/2013   Past Surgical History:  Procedure Laterality Date  . ABDOMINAL AORTAGRAM N/A 01/03/2014   Procedure: ABDOMINAL [name];  Surgeon: Wellington Hampshire, MD;  Location: [facility] CATH [facility];  Service: Cardiovascular;  Laterality: N/A;  . ABDOMINAL HYSTERECTOMY  1972   "partial"  . APPENDECTOMY  1970's  . CARDIAC CATHETERIZATION  2008   Tiny OM-2 with 90% narrowing. Med tx.  [name] CARDIAC CATHETERIZATION  N/A 11/30/2014   Procedure: Left Heart Cath and Coronary Angiography;  Surgeon: Troy Sine, MD; LAD 20%, CFX 50%, OM1 95%, right PLB 30%, LV normal   . CARDIAC CATHETERIZATION N/A 11/30/2014   Procedure: Coronary Balloon Angioplasty;  Surgeon: Troy Sine, MD;  Angiosculpt scoring balloon and PTCA to the OM1 reducing stenosis from 95% to less than 10%  . CARDIAC CATHETERIZATION N/A 04/03/2015   Procedure: Left Heart Cath and Coronary Angiography;  Surgeon: Jolaine Artist, MD; dLAD 50%, CFX 90%, OM1 100%, PLA 15%, LVEDP 13    . CARDIAC CATHETERIZATION N/A 04/03/2015   Procedure: Coronary Stent Intervention;  Surgeon: Sherren Mocha, MD; 3.0x18 mm Xience DES to the CFX    . CARDIAC CATHETERIZATION N/A 08/02/2015   Procedure: Left Heart Cath and Coronary Angiography;  Surgeon: Troy Sine, MD;  Location: [place] [facility];  Service: Cardiovascular;  Laterality: N/A;  . CARDIAC CATHETERIZATION N/A 08/02/2015   Procedure: Coronary Stent Intervention;  Surgeon: Troy Sine, MD;  Location: [place];  Service: Cardiovascular;  Laterality: N/A;  . CARDIAC CATHETERIZATION N/A 06/25/2016   Procedure: Pericardiocentesis;  Surgeon: Ksean Vale Meredith Leeds, MD;  Location: [place];  Service: Cardiovascular;  Laterality: N/A;  . cardiac stents    . CHOLECYSTECTOMY OPEN  1990's  . COLONOSCOPY  2005   Dr. Laural [place]: pancolonic divericula, polyp, path unknown currently  . COLONOSCOPY  2012   Dr. Oneida Alar: Normal TI, scattered diverticula in entire colon, small internal hemorrhoids, normal colon biopsies. Colonoscopy in 5-10 years.   . COLOSTOMY  05/1979  . COLOSTOMY REVERSAL  11/1979  . EP IMPLANTABLE DEVICE N/A 06/25/2016   Procedure: Lead Revision/Repair;  Surgeon: Kedra Mcglade Meredith Leeds, MD;  Location: [place] [facility];  Service: Cardiovascular;  Laterality: N/A;  . EP IMPLANTABLE DEVICE N/A 06/25/2016   Procedure: Pacemaker Implant;  Surgeon: Ethelmae Ringel Meredith Leeds, MD;  Location: [facility];  Service: Cardiovascular;  Laterality: N/A;  . EXCISIONAL HEMORRHOIDECTOMY  1970's  . EYE SURGERY Left 2000   "branch vein occlusion"  . EYE SURGERY Left ~ 2001   "smoothed out wrinkle"  . LEFT OOPHORECTOMY  05/1979   nicked bowel, peritonitis, colostomy; colostomy reversed 1981   . LOWER EXTREMITY ANGIOGRAM N/A 01/03/2014   Procedure: LOWER EXTREMITY ANGIOGRAM;  Surgeon: Wellington Hampshire, MD;  Location: [facility] CATH [facility];  Service: Cardiovascular;  Laterality:  N/A;  . Nuclear med stress test  10/2011   Small area of mild ischemia inferoapically.  [name] PARTIAL HYSTERECTOMY  1970's   left ovaries, then ovaries removed later due tumors   . RIGHT OOPHORECTOMY  1970's     Current Outpatient Prescriptions  Medication Sig Dispense Refill  . acetaminophen (TYLENOL) 325 MG tablet Take 650 mg by mouth every 6 (six) hours as needed for headache.    [name] amiodarone (PACERONE) 200 MG tablet Take 1 tablet (200 mg total) by mouth daily. 30 tablet 6  . atorvastatin (LIPITOR) 80 MG tablet Take 1 tablet (80 mg total) by mouth daily at 6 PM. 30 tablet 6  . cholecalciferol (VITAMIN D) 1000 UNITS tablet Take 1,000 Units by mouth at bedtime.     . clopidogrel (PLAVIX) 75 MG tablet TAKE ONE (1) TABLET BY MOUTH EVERY DAY 90 tablet 3  . diltiazem (CARDIZEM CD) 360 MG 24 hr capsule Take 1 capsule (360 mg total) by mouth daily. 30 capsule 11  . diphenhydramine-acetaminophen (TYLENOL PM EXTRA STRENGTH) 25-500 MG TABS tablet Take  1 tablet by mouth at bedtime as needed (Take as directed).     [name] ELIQUIS 5 MG TABS tablet TAKE (1) TABLET BY MOUTH TWICE DAILY. 180 tablet 0  . furosemide (LASIX) 40 MG tablet TAKE ONE TABLET BY MOUTH ONCE DAILY 90 tablet 3  . glucose blood (FREESTYLE LITE) test strip As directed bid. E11.65 100 each 3  . hydrALAZINE (APRESOLINE) 50 MG tablet Take 2 tablets (100 mg total) by mouth 3 (three) times daily. 180 tablet 3  . insulin NPH-regular Human (NOVOLIN 70/30) (70-30) 100 UNIT/ML injection Inject 25 Units into the skin See admin instructions. Inject 25 units before breakfast and 25 units before supper  - only if pre-meal blood glucose is above 90 mg/dL.    [name] levothyroxine (SYNTHROID, LEVOTHROID) 137 MCG tablet TAKE 1 TABLET DAILY BEFORE BREAKFAST. 90 tablet 0  . losartan (COZAAR) 100 MG tablet TAKE ONE (1) TABLET BY MOUTH EVERY DAY 90 tablet 3  . magnesium oxide (MAG-OX) 400 MG tablet Take 400 mg by mouth daily.    . metFORMIN (GLUCOPHAGE) 500 MG tablet  Take 1 tablet (500 mg total) by mouth 2 (two) times daily with a meal. 180 tablet 0  . nitroGLYCERIN (NITROSTAT) 0.4 MG SL tablet Place 1 tablet (0.4 mg total) under the tongue every 5 (five) minutes as needed for chest pain. 25 tablet 12  . Omega-3 Fatty Acids (FISH OIL) 1200 MG CAPS Take 2 capsules (2,400 mg total) by mouth 2 (two) times daily. 120 each 3  . potassium chloride SA (K-DUR,KLOR-CON) 20 MEQ tablet TAKE ONE (1) TABLET BY MOUTH EVERY DAY 90 tablet 3  . rOPINIRole (REQUIP) 0.5 MG tablet Take 0.5 mg by mouth at bedtime.   0  . carvedilol (COREG) 12.5 MG tablet Take 1 tablet (12.5 mg total) by mouth 2 (two) times daily. 180 tablet 3   No current facility-administered medications for this visit.     Allergies:   Penicillins and Percocet [oxycodone-acetaminophen]   Social History:  The patient  reports that she has never smoked. She has never used smokeless tobacco. She reports that she does not drink alcohol or use drugs.   Family History:  The patient's family history includes Diabetes in her brother; Heart disease in her brother, brother, father, mother, and sister; Lupus in her daughter; Thyroid disease in her brother and brother.    ROS:  Please see the history of present illness.   Otherwise, review of systems is positive for Shortness of breath, visual changes,.   All other systems are reviewed and negative.   PHYSICAL EXAM: VS:  BP (!) 116/56   Pulse 79   Ht 5\' 3"  (1.6 m)   Wt 158 lb (71.7 kg)   BMI 27.99 kg/m  , BMI Body mass index is 27.99 kg/m. GEN: Well nourished, well developed, in no acute distress  HEENT: normal  Neck: no JVD, carotid bruits, or masses Cardiac: RRR; no murmurs, rubs, or gallops,no edema  Respiratory:  clear to auscultation bilaterally, normal work of breathing GI: soft, nontender, nondistended, + BS MS: no deformity or atrophy  Skin: warm and dry Neuro:  Strength and sensation are intact Psych: euthymic mood, full affect  EKG:  EKG is  ordered today. Personal review of the ekg ordered shows A paced, 1 degree AV block, lateral TWI    Recent Labs: 06/07/2016: B Natriuretic Peptide 155.0 06/28/2016: Hemoglobin 8.6; Platelets 208 01/25/2017: ALT 17; BUN 16; Creat 1.07; Potassium 4.6; Sodium 139; TSH 5.64  Lipid Panel     Component Value Date/Time   CHOL 116 (L) 04/14/2016 0910   TRIG 439 (H) 04/14/2016 0910   HDL 24 (L) 04/14/2016 0910   CHOLHDL 4.8 04/14/2016 0910   VLDL NOT CALC 04/14/2016 0910   LDLCALC NOT CALC 04/14/2016 0910     Wt Readings from Last 3 Encounters:  03/25/17 158 lb (71.7 kg)  02/01/17 157 lb (71.2 kg)  02/01/17 157 lb (71.2 kg)      Other studies Reviewed: Additional studies/ records that were reviewed today include: TTE 08/03/16, Cath  08/02/15 Review of the above records today demonstrates:  - Left ventricle: The cavity size was normal. Wall thickness was   increased in a pattern of severe LVH. Systolic function was   normal. The estimated ejection fraction was in the range of 55%   to 60%. Features are consistent with a pseudonormal left   ventricular filling pattern, with concomitant abnormal relaxation   and increased filling pressure (grade 2 diastolic dysfunction).   Doppler parameters are consistent with high ventricular filling   pressure. - Regional wall motion abnormality: Hypokinesis of the basal-mid   anteroseptal myocardium. - Aortic valve: Mildly calcified annulus. Trileaflet; mildly   thickened leaflets. Valve area (VTI): 1.93 cm^2. Valve area   (Vmax): 2.1 cm^2. - Mitral valve: Mildly calcified annulus. Mildly thickened leaflets   . There was mild regurgitation. - Left atrium: The atrium was mildly dilated. - Right ventricle: The cavity size was mildly dilated. - Atrial septum: No defect or patent foramen ovale was identified.   1st RPLB lesion, 50% stenosed.  Dist RCA lesion, 30% stenosed.  Ost 1st Mrg to 1st Mrg lesion, 100% stenosed.  Ost LAD lesion, 40%  stenosed.  Mid LAD lesion, 40% stenosed.  Post Atrio lesion, 90% stenosed. Post intervention, there is a 0% residual stenosis.  The left ventricular systolic function is normal.  Mid RCA lesion, 20% stenosed.   Normal LV function without focal segmental wall motion abnormalities and ejection fraction of 55%.  Significant coronary obstructive disease with diffuse mild luminal narrowing of the LAD with 40% proximal and mid stenoses; widely patent stent in the proximal circumflex with old occlusion of the marginal branch which had arisen in the region of the stented segment but with evidence for very faint collateralization to this distal marginal vessel from the the LAD; a large dominant RCA with 20%, mild mid narrowing, 30% narrowing after the acute margin and focal 90% stenosis distally prior to a PDA 2 vessel with 50% narrowing at the ostium of this PDA prior to a bend in the vessel with the RCA ending in the PLA vessel.  Successful percutaneous cardiac intervention to the distal RCA treated with Angiosculpt scoring balloon, PTCA, and ultimate stenting with a 2.58 mm Xience Alpine DES stent postdilated to 2.51 mm with a 90% stenosis reduced to 0% and no change in the ostial PDA narrowing.  ASSESSMENT AND PLAN:  1.  Paroxysmal atrial fibrillation with tachybradycardia syndrome:  Status post Medtronic dual chamber pacemaker. Device is functioning appropriately. Is in atrial fibrillation 0.7% of the time. Continue anticoagulation.  This patients CHA2DS2-VASc Score and unadjusted Ischemic Stroke Rate (% per year) is equal to 11.2 % stroke rate/year from a score of 7  Above score calculated as 1 point each if present [CHF, HTN, DM, Vascular=MI/PAD/Aortic Plaque, Age if 65-74, or Female] Above score calculated as 2 points each if present [Age > 75, or Stroke/TIA/TE]  2. Coronary artery disease: Currently  no chest pain  3. Hypertension: Blood pressure is well-controlled today. Unfortunately  she has had quite a bit of fatigue and shortness of breath. OPlan to decrease carvedilol to 12.5 mg, which may help improve her fatigue and shortness of breath.  Current medicines are reviewed at length with the patient today.   The patient does not have concerns regarding her medicines.  The following changes were made today:  Decrease carvedilol  Labs/ tests ordered today include:  Orders Placed This Encounter  Procedures  . EKG 12-Lead     Disposition:   FU with Paytience Bures  12 months  Signed, Abdulwahab Demelo Meredith Leeds, MD  03/25/2017 12:06 PM     [place] [address] [phone] (office) [phone] (fax)

## 2017-04-12 DIAGNOSIS — D510 Vitamin B12 deficiency anemia due to intrinsic factor deficiency: Secondary | ICD-10-CM | POA: Diagnosis not present

## 2017-04-20 ENCOUNTER — Encounter: Payer: Self-pay | Admitting: Cardiology

## 2017-04-21 DIAGNOSIS — G4733 Obstructive sleep apnea (adult) (pediatric): Secondary | ICD-10-CM | POA: Diagnosis not present

## 2017-04-26 DIAGNOSIS — H34831 Tributary (branch) retinal vein occlusion, right eye, with macular edema: Secondary | ICD-10-CM | POA: Diagnosis not present

## 2017-04-26 DIAGNOSIS — H35041 Retinal micro-aneurysms, unspecified, right eye: Secondary | ICD-10-CM | POA: Diagnosis not present

## 2017-04-26 DIAGNOSIS — E113411 Type 2 diabetes mellitus with severe nonproliferative diabetic retinopathy with macular edema, right eye: Secondary | ICD-10-CM | POA: Diagnosis not present

## 2017-04-30 DIAGNOSIS — E039 Hypothyroidism, unspecified: Secondary | ICD-10-CM | POA: Diagnosis not present

## 2017-04-30 DIAGNOSIS — E1159 Type 2 diabetes mellitus with other circulatory complications: Secondary | ICD-10-CM | POA: Diagnosis not present

## 2017-05-01 LAB — RENAL FUNCTION PANEL
ALBUMIN MSPROF: 4.1 g/dL (ref 3.6–5.1)
BUN / CREAT RATIO: 15 (calc) (ref 6–22)
BUN: 17 mg/dL (ref 7–25)
CO2: 28 mmol/L (ref 20–32)
CREATININE: 1.12 mg/dL — AB (ref 0.60–0.93)
Calcium: 9.1 mg/dL (ref 8.6–10.4)
Chloride: 105 mmol/L (ref 98–110)
Glucose, Bld: 210 mg/dL — ABNORMAL HIGH (ref 65–99)
PHOSPHORUS: 2.9 mg/dL (ref 2.1–4.3)
Potassium: 4.5 mmol/L (ref 3.5–5.3)
Sodium: 140 mmol/L (ref 135–146)

## 2017-05-01 LAB — HEMOGLOBIN A1C
EAG (MMOL/L): 8.4 (calc)
Hgb A1c MFr Bld: 6.9 % of total Hgb — ABNORMAL HIGH (ref <5.7)
Mean Plasma Glucose: 151 (calc)

## 2017-05-01 LAB — T4, FREE: Free T4: 2 ng/dL — ABNORMAL HIGH (ref 0.8–1.8)

## 2017-05-01 LAB — TSH: TSH: 4.97 mIU/L — ABNORMAL HIGH (ref 0.40–4.50)

## 2017-05-04 ENCOUNTER — Other Ambulatory Visit: Payer: Self-pay | Admitting: "Endocrinology

## 2017-05-10 ENCOUNTER — Encounter: Payer: Self-pay | Admitting: Nutrition

## 2017-05-10 ENCOUNTER — Ambulatory Visit (INDEPENDENT_AMBULATORY_CARE_PROVIDER_SITE_OTHER): Payer: PPO | Admitting: "Endocrinology

## 2017-05-10 ENCOUNTER — Other Ambulatory Visit: Payer: Self-pay | Admitting: "Endocrinology

## 2017-05-10 ENCOUNTER — Encounter: Payer: Self-pay | Admitting: "Endocrinology

## 2017-05-10 ENCOUNTER — Encounter: Payer: PPO | Attending: Family Medicine | Admitting: Nutrition

## 2017-05-10 VITALS — BP 127/74 | Ht 63.0 in | Wt 157.0 lb

## 2017-05-10 DIAGNOSIS — I10 Essential (primary) hypertension: Secondary | ICD-10-CM

## 2017-05-10 DIAGNOSIS — E118 Type 2 diabetes mellitus with unspecified complications: Secondary | ICD-10-CM

## 2017-05-10 DIAGNOSIS — E782 Mixed hyperlipidemia: Secondary | ICD-10-CM

## 2017-05-10 DIAGNOSIS — E669 Obesity, unspecified: Secondary | ICD-10-CM

## 2017-05-10 DIAGNOSIS — E1165 Type 2 diabetes mellitus with hyperglycemia: Secondary | ICD-10-CM | POA: Insufficient documentation

## 2017-05-10 DIAGNOSIS — E039 Hypothyroidism, unspecified: Secondary | ICD-10-CM | POA: Diagnosis not present

## 2017-05-10 DIAGNOSIS — Z713 Dietary counseling and surveillance: Secondary | ICD-10-CM | POA: Diagnosis not present

## 2017-05-10 DIAGNOSIS — IMO0002 Reserved for concepts with insufficient information to code with codable children: Secondary | ICD-10-CM

## 2017-05-10 DIAGNOSIS — E1159 Type 2 diabetes mellitus with other circulatory complications: Secondary | ICD-10-CM | POA: Diagnosis not present

## 2017-05-10 MED ORDER — LEVOTHYROXINE SODIUM 125 MCG PO TABS: 125.0000 ug | ORAL_TABLET | Freq: Every day | ORAL | 3 refills | Status: DC

## 2017-05-10 NOTE — Progress Notes (Signed)
  Diabetes Self-Management Education  Visit Type:  Follow-up  Appt. Start Time: 1000 Appt. End Time: 1030  05/10/2017  [name], identified by name and date of birth, is a 71 y.o. female with a diagnosis of Diabetes:  .   ASSESSMENT  Wt Readings from Last 3 Encounters:  05/10/17 157 lb (71.2 kg)  03/25/17 158 lb (71.7 kg)  02/01/17 157 lb (71.2 kg)   Ht Readings from Last 3 Encounters:  05/10/17 5\' 3"  (1.6 m)  03/25/17 5\' 3"  (1.6 m)  02/01/17 5\' 3"  (1.6 m)   Lab Results  Component Value Date   HGBA1C 6.9 (H) 04/30/2017    There is no height or weight on file to calculate BMI. @BMIFA @ Facility age limit for growth percentiles is 20 years. Facility age limit for growth percentiles is 20 years.   Diabetes Self-Management Education - 75/64/33 2951      Complications   How often do you check your blood sugar?  1-2 times/day    Fasting Blood glucose range (mg/dL)  130-179    Number of hypoglycemic episodes per month  1       Learning Objective:  Patient will have a greater understanding of diabetes self-management. Patient education plan is to attend individual and/or group sessions per assessed needs and concerns.   Plan:   Patient Instructions  Goals 1. Lose 5 lbs in the next 3-4 months 2. Keep drinking water 3. Exercicise 15-20 minutes. Kekep A1C 6.9 or below.     Expected Outcomes:    Lower blood sugars and improved health.  Education material provided: My Plate  If problems or questions, patient to contact team via:  Phone and Email  Future DSME appointment: -   3 months.

## 2017-05-10 NOTE — Patient Instructions (Addendum)
Goals 1. Lose 5 lbs in the next 3-4 months 2. Keep drinking water 3. Exercicise 15-20 minutes. Kekep A1C 6.9 or below.

## 2017-05-10 NOTE — Patient Instructions (Signed)

## 2017-05-10 NOTE — Progress Notes (Signed)
Subjective:    Patient ID: Virginia Huber, female    DOB: 02/04/1946. Patient is being seen in f/u for management of diabetes requested by  Sharilyn Sites, MD  Past Medical History:  Diagnosis Date  . Arthritis   . Atrial fibrillation and flutter ([place])    a. h/o PAF/flutter during admission in 2013 for PNA. b. PAF during adm for NSTEMI 07/2015, subsequent paroxysms since then.  . B12 deficiency anemia   . Cardiac tamponade 06/2016  . Coronary artery disease 11/30/2014   a. remote MI. b. h/o PTCA with scoring balloon to OM1 11/2014. c. NSTEMI 03/2015 s/p DES to prox-mid Cx. d. NSTEMI 07/2015 s/p scoring balloon/PTCA/DES to dRCA with PAF during that admission  . Cutaneous lupus erythematosus   . GERD (gastroesophageal reflux disease)   . Heart block   . History of blood transfusion 1980's   2nd surgical procedures  . HTN (hypertension)   . Hypercholesteremia   . Hypothyroidism   . Myocardial infarction ([place]) 02/2012  . Ovarian tumor   . PAD (peripheral artery disease) ([place])    a. s/p LE angio 2015; followed by Dr. Fletcher Anon - managed medically.  . Pain with urination 05/08/2015  . Paroxysmal atrial flutter ([place])   . Pericardial effusion    a. 06/2016 after ppm - s/p pericardiocentesis.  . Superficial fungus infection of skin 06/29/2013  . Tachy-brady syndrome ([place])    a. s/p Medtronic PPM 06/2016, c/b lead perf/pericardial effusion.  [name] TIA (transient ischemic attack) 08/2001; ~ 2006  . Type II diabetes mellitus ([place])   . UTI (urinary tract infection) 05/08/2013   Past Surgical History:  Procedure Laterality Date  . ABDOMINAL AORTAGRAM N/A 01/03/2014   Performed by Wellington Hampshire, MD at [facility] CATH LAB  . ABDOMINAL HYSTERECTOMY  1972   "partial"  . APPENDECTOMY  1970's  . CANCELLED PROCEDURE  07/06/2016   Performed by Josue Hector, MD at [facility] ENDOSCOPY  . CARDIAC CATHETERIZATION  2008   Tiny OM-2 with 90% narrowing. Med tx.  . cardiac stents    . CHOLECYSTECTOMY OPEN  1990's  .  COLONOSCOPY  2005   Dr. Laural Golden: pancolonic divericula, polyp, path unknown currently  . COLONOSCOPY  2012   Dr. Oneida Alar: Normal TI, scattered diverticula in entire colon, small internal hemorrhoids, normal colon biopsies. Colonoscopy in 5-10 years.   . COLOSTOMY  05/1979  . COLOSTOMY REVERSAL  11/1979  . Coronary Balloon Angioplasty N/A 11/30/2014   Performed by Troy Sine, MD at [place] CV LAB  . Coronary Stent Intervention N/A 08/02/2015   Performed by Troy Sine, MD at [place] CV LAB  . Coronary Stent Intervention N/A 04/03/2015   Performed by Sherren Mocha, MD at [place] CV LAB  . EXCISIONAL HEMORRHOIDECTOMY  1970's  . EYE SURGERY Left 2000   "branch vein occlusion"  . EYE SURGERY Left ~ 2001   "smoothed out wrinkle"  . Lead Revision/Repair N/A 06/25/2016   Performed by Constance Haw, MD at [place] CV LAB  . Left Heart Cath and Coronary Angiography N/A 08/02/2015   Performed by Troy Sine, MD at [place] CV LAB  . Left Heart Cath and Coronary Angiography N/A 04/03/2015   Performed by Jolaine Artist, MD at [place] [facility] LAB  . Left Heart Cath and Coronary Angiography N/A 11/30/2014   Performed by Troy Sine, MD at [place] [facility] LAB  . LEFT OOPHORECTOMY  05/1979  nicked bowel, peritonitis, colostomy; colostomy reversed 1981   . LOWER EXTREMITY ANGIOGRAM N/A 01/03/2014   Performed by Wellington Hampshire, MD at [facility] C[facility] LAB  . Nuclear med stress test  10/2011   Small area of mild ischemia inferoapically.  . Pacemaker Implant N/A 06/25/2016   Performed by Constance Haw, MD at [place] [facility] LAB  . PARTIAL HYSTERECTOMY  1970's   left ovaries, then ovaries removed later due tumors   . Pericardiocentesis N/A 06/25/2016   Performed by Constance Haw, MD at [place] [facility]  . RIGHT OOPHORECTOMY  1970's   Social History   Socioeconomic History  . Marital status: Married    Spouse name: None  . Number of children: None  .  Years of education: None  . Highest education level: None  Social Needs  . Financial resource strain: None  . Food insecurity - worry: None  . Food insecurity - inability: None  . Transportation needs - medical: None  . Transportation needs - non-medical: None  Occupational History  . Occupation: Retired    Fish farm manager: RETIRED    Comment: Research officer, political party  Tobacco Use  . Smoking status: Never Smoker  . Smokeless tobacco: Never Used  . Tobacco comment: Never smoked  Substance and Sexual Activity  . Alcohol use: No    Alcohol/week: 0.0 oz  . Drug use: No  . Sexual activity: No    Birth control/protection: Surgical    Comment: hyst  Other Topics Concern  . None  Social History Narrative  . None   Outpatient Encounter Medications as of 05/10/2017  Medication Sig  . acetaminophen (TYLENOL) 325 MG tablet Take 650 mg by mouth every 6 (six) hours as needed for headache.  [name] amiodarone (PACERONE) 200 MG tablet Take 1 tablet (200 mg total) by mouth daily.  [name] atorvastatin (LIPITOR) 80 MG tablet Take 1 tablet (80 mg total) by mouth daily at 6 PM.  . carvedilol (COREG) 12.5 MG tablet Take 1 tablet (12.5 mg total) by mouth 2 (two) times daily.  . cholecalciferol (VITAMIN D) 1000 UNITS tablet Take 1,000 Units by mouth at bedtime.   . clopidogrel (PLAVIX) 75 MG tablet TAKE ONE (1) TABLET BY MOUTH EVERY DAY  . diltiazem (CARDIZEM CD) 360 MG 24 hr capsule Take 1 capsule (360 mg total) by mouth daily.  . diphenhydramine-acetaminophen (TYLENOL PM EXTRA STRENGTH) 25-500 MG TABS tablet Take 1 tablet by mouth at bedtime as needed (Take as directed).   [name] ELIQUIS 5 MG TABS tablet TAKE (1) TABLET BY MOUTH TWICE DAILY.  . furosemide (LASIX) 40 MG tablet TAKE ONE TABLET BY MOUTH ONCE DAILY  . glucose blood (FREESTYLE LITE) test strip USE AS DIRECTED TWICE DAILY.  . hydrALAZINE (APRESOLINE) 50 MG tablet Take 2 tablets (100 mg total) by mouth 3 (three) times daily.  . insulin NPH-regular Human (NOVOLIN  70/30) (70-30) 100 UNIT/ML injection Inject 25 Units into the skin See admin instructions. Inject 25 units before breakfast and 25 units before supper  - only if pre-meal blood glucose is above 90 mg/dL.  [name] levothyroxine (SYNTHROID, LEVOTHROID) 125 MCG tablet Take 1 tablet (125 mcg total) daily before breakfast by mouth.  . losartan (COZAAR) 100 MG tablet TAKE ONE (1) TABLET BY MOUTH EVERY DAY  . magnesium oxide (MAG-OX) 400 MG tablet Take 400 mg by mouth daily.  . metFORMIN (GLUCOPHAGE) 500 MG tablet Take 1 tablet (500 mg total) by mouth 2 (two) times daily with a meal.  .  nitroGLYCERIN (NITROSTAT) 0.4 MG SL tablet Place 1 tablet (0.4 mg total) under the tongue every 5 (five) minutes as needed for chest pain.  . Omega-3 Fatty Acids (FISH OIL) 1200 MG CAPS Take 2 capsules (2,400 mg total) by mouth 2 (two) times daily.  . potassium chloride SA (K-DUR,KLOR-CON) 20 MEQ tablet TAKE ONE (1) TABLET BY MOUTH EVERY DAY  . rOPINIRole (REQUIP) 0.5 MG tablet Take 0.5 mg by mouth at bedtime.   . [DISCONTINUED] levothyroxine (SYNTHROID, LEVOTHROID) 137 MCG tablet TAKE 1 TABLET DAILY BEFORE BREAKFAST.   No facility-administered encounter medications on file as of 05/10/2017.    ALLERGIES: VACCINATION STATUS: Immunization History  Administered Date(s) Administered  . Influenza-Unspecified 03/27/2015  . Pneumococcal-Unspecified 02/21/2012    Diabetes  She presents for her follow-up diabetic visit. She has type 2 diabetes mellitus. Onset time: She was diagnosed at approximate age of 1 years. Her disease course has been stable. There are no hypoglycemic associated symptoms. Pertinent negatives for hypoglycemia include no confusion, headaches, pallor, seizures or tremors. Associated symptoms include fatigue. Pertinent negatives for diabetes include no chest pain, no polydipsia, no polyphagia and no polyuria. There are no hypoglycemic complications. Symptoms are stable. Diabetic complications include a CVA and  heart disease. Risk factors for coronary artery disease include diabetes mellitus, dyslipidemia, hypertension and sedentary lifestyle. Current diabetic treatments: She is on Novolin 70/30 60 units twice a day, metformin 500 mg 3 times a day. She is compliant with treatment most of the time. Her weight is stable. She is following a generally unhealthy diet. When asked about meal planning, she reported none. She has had a previous visit with a dietitian. She rarely participates in exercise. Home blood sugar record trend: She did not bring the meter to review today. She mentions that she is monitoring randomly and regular.[name] Her breakfast blood glucose range is generally 130-140 mg/dl. Her dinner blood glucose range is generally 130-140 mg/dl. Her overall blood glucose range is 130-140 mg/dl. An ACE inhibitor/angiotensin II receptor blocker is being taken. Eye exam is current.  Hyperlipidemia  This is a chronic problem. The current episode started more than 1 year ago. The problem is uncontrolled. Recent lipid tests were reviewed and are high. Exacerbating diseases include diabetes and hypothyroidism. Pertinent negatives include no chest pain, myalgias or shortness of breath. Current antihyperlipidemic treatment includes statins, fibric acid derivatives and bile acid squestrants. The current treatment provides moderate improvement of lipids.  Hypertension  This is a chronic problem. The current episode started more than 1 year ago. The problem is uncontrolled. Pertinent negatives include no chest pain, headaches, palpitations or shortness of breath. Risk factors for coronary artery disease include dyslipidemia, diabetes mellitus and sedentary lifestyle. Past treatments include angiotensin blockers. Hypertensive end-organ damage includes CAD/MI and CVA.     Review of Systems  Constitutional: Positive for fatigue. Negative for chills, fever and unexpected weight change.  HENT: Negative for trouble swallowing and  voice change.   Eyes: Negative for visual disturbance.  Respiratory: Negative for cough, shortness of breath and wheezing.   Cardiovascular: Negative for chest pain, palpitations and leg swelling.       No Shortness of breath  Gastrointestinal: Negative for abdominal pain, diarrhea, nausea and vomiting.  Endocrine: Negative for cold intolerance, heat intolerance, polydipsia, polyphagia and polyuria.  Genitourinary: Negative for frequency, hematuria and urgency.  Musculoskeletal: Negative for arthralgias and myalgias.  Skin: Negative for color change, pallor, rash and wound.  Neurological: Negative for tremors, seizures and headaches.  Hematological:  Does not bruise/bleed easily.  Psychiatric/Behavioral: Negative for confusion, hallucinations and suicidal ideas.    Objective:    BP 127/74   Ht 5\' 3"  (1.6 m)   Wt 157 lb (71.2 kg)   BMI 27.81 kg/m   Wt Readings from Last 3 Encounters:  05/10/17 157 lb (71.2 kg)  03/25/17 158 lb (71.7 kg)  02/01/17 157 lb (71.2 kg)    Physical Exam  Constitutional: She is oriented to person, place, and time. She appears well-developed.  HENT:  Head: Normocephalic and atraumatic.  Eyes: EOM are normal.  Neck: Normal range of motion. Neck supple. No tracheal deviation present. No thyromegaly present.  Cardiovascular: Normal rate and regular rhythm.  Pulmonary/Chest: Effort normal and breath sounds normal.  Abdominal: Soft. Bowel sounds are normal. There is no tenderness. There is no guarding.  Musculoskeletal: Normal range of motion. She exhibits no edema.  Neurological: She is alert and oriented to person, place, and time. She has normal reflexes. No cranial nerve deficit. Coordination normal.  Skin: Skin is warm and dry. No rash noted. No erythema. No pallor.  Psychiatric: She has a normal mood and affect. Judgment normal.    CMP     Component Value Date/Time   NA 140 04/30/2017 0807   NA 140 06/23/2016 1550   K 4.5 04/30/2017 0807   CL  105 04/30/2017 0807   CO2 28 04/30/2017 0807   GLUCOSE 210 (H) 04/30/2017 0807   BUN 17 04/30/2017 0807   BUN 21 06/23/2016 1550   CREATININE 1.12 (H) 04/30/2017 0807   CALCIUM 9.1 04/30/2017 0807   PROT 6.4 01/25/2017 0922   ALBUMIN 4.4 01/25/2017 0922   AST 12 01/25/2017 0922   ALT 17 01/25/2017 0922   ALKPHOS 62 01/25/2017 0922   BILITOT 0.6 01/25/2017 0922   GFRNONAA 52 (L) 01/25/2017 0922   GFRAA 60 01/25/2017 0922     Diabetic Labs (most recent): Lab Results  Component Value Date   HGBA1C 6.9 (H) 04/30/2017   HGBA1C 6.9 (H) 01/25/2017   HGBA1C 7.1 (H) 10/22/2016     Lipid Panel ( most recent) Lipid Panel     Component Value Date/Time   CHOL 116 (L) 04/14/2016 0910   TRIG 439 (H) 04/14/2016 0910   HDL 24 (L) 04/14/2016 0910   CHOLHDL 4.8 04/14/2016 0910   VLDL NOT CALC 04/14/2016 0910   LDLCALC NOT CALC 04/14/2016 0910      Assessment & Plan:   1. DM type 2 causing vascular disease ([place]) - Patient has currently uncontrolled symptomatic type 2 DM since  71 years of age. - She came with stable and controlled glycemic profile, A1c remains stable at 6.9%. - I have reviewed her most recent labs.  -Her diabetes is complicated by coronary artery disease which is recurrent requiring stent placement , CVA, and patient remains at a high risk for more acute and chronic complications of diabetes which include CAD, CVA, CKD, retinopathy, and neuropathy. These are all discussed in detail with the patient.  - I have counseled the patient on diet management and weight loss, by adopting a carbohydrate restricted/protein rich diet.  -  Suggestion is made for her to avoid simple carbohydrates  from her diet including Cakes, Sweet Desserts / Pastries, Ice Cream, Soda (diet and regular), Sweet Tea, Candies, Chips, Cookies, Store Bought Juices, Alcohol in Excess of  1-2 drinks a day, Artificial Sweeteners, and "Sugar-free" Products. This will help patient to have stable blood glucose  profile and potentially  avoid unintended weight gain.  - I encouraged the patient to switch to  unprocessed or minimally processed complex starch and increased protein intake (animal or plant source), fruits, and vegetables.  - Patient is advised to stick to a routine mealtimes to eat 3 meals  a day and avoid unnecessary snacks ( to snack only to correct hypoglycemia).   - I have approached patient with the following individualized plan to manage diabetes and patient agrees:   - Due to cost reasons , and because of the fact that she is responding appropriately, I will keep her on  Novolin 70/30. - I will continue Novolin 70/30  25  units with breakfast and 25 units with supper when pre-meal blood glucose is above 90 mg/dL. - She is advised to continue strict monitoring of glucose   2 times a day-before breakfast and at bedtime and as needed.  - If her blood glucose profile is significantly abnormal, she will be considered for insulin analogs. - Patient is warned not to take insulin without proper monitoring per orders. -Patient is encouraged to call clinic for blood glucose levels less than 70 or above 300 mg /dl. - I will continue metformin  500 mg by mouth twice a day  ,  still therapeutically suitable for patient.  - Patient specific target  A1c;  LDL, HDL, Triglycerides, and  Waist Circumference were discussed in detail.  2) BP/HTN: controlled. Continue current medications including ACEI/ARB. 3) Lipids/HPL:  Uncontrolled with triglycerides of 439, LDL was not calculated. I advised her to continue statins and increase her fish oil to 2 capsules twice a day. She will have fasting lipid panel before her next visit. 4)  Weight/Diet: CDE Consult in progress, exercise, and detailed carbohydrates information provided.   5) hypothyroidism: Long-standing for at least 20 years.  - Her thyroid function tests are consistent with over replacement (still conflicting with high free T4 and high TSH  although TSH is improving).  - I advised her to lower her levothyroxine to 125 g by mouth every morning.  - We discussed about correct intake of levothyroxine, at fasting, with water, separated by at least 30 minutes from breakfast, and separated by more than 4 hours from calcium, iron, multivitamins, acid reflux medications (PPIs). -Patient is made aware of the fact that thyroid hormone replacement is needed for life, dose to be adjusted by periodic monitoring of thyroid function tests.  6) Chronic Care/Health Maintenance:  -Patient is on ACEI/ARB and Statin medications and encouraged to continue to follow up with Ophthalmology, Podiatrist at least yearly or according to recommendations, and advised to  stay away from smoking. I have recommended yearly flu vaccine and pneumonia vaccination at least every 5 years; moderate intensity exercise for up to 150 minutes weekly; and  sleep for at least 7 hours a day.  - I advised patient to maintain close follow up with Sharilyn Sites, MD for primary care needs.  - Time spent with the patient: 25 min, of which >50% was spent in reviewing her sugar logs , discussing her hypo- and hyper-glycemic episodes, reviewing her current and  previous labs and insulin doses and developing a plan to avoid hypo- and hyper-glycemia.    Follow up plan: - Return in about 3 months (around 08/10/2017) for follow up with pre-visit labs, meter, and logs.  Glade Lloyd, MD Phone: [phone]  Fax: [phone]  -  This note was partially dictated with voice recognition software. Similar sounding words can be transcribed inadequately or  may not  be corrected upon review.  05/10/2017, 11:25 AM

## 2017-05-12 DIAGNOSIS — D510 Vitamin B12 deficiency anemia due to intrinsic factor deficiency: Secondary | ICD-10-CM | POA: Diagnosis not present

## 2017-05-21 DIAGNOSIS — G4733 Obstructive sleep apnea (adult) (pediatric): Secondary | ICD-10-CM | POA: Diagnosis not present

## 2017-06-02 ENCOUNTER — Telehealth: Payer: Self-pay | Admitting: Cardiovascular Disease

## 2017-06-02 DIAGNOSIS — H43811 Vitreous degeneration, right eye: Secondary | ICD-10-CM | POA: Diagnosis not present

## 2017-06-02 DIAGNOSIS — E113411 Type 2 diabetes mellitus with severe nonproliferative diabetic retinopathy with macular edema, right eye: Secondary | ICD-10-CM | POA: Diagnosis not present

## 2017-06-02 DIAGNOSIS — H33301 Unspecified retinal break, right eye: Secondary | ICD-10-CM | POA: Diagnosis not present

## 2017-06-02 DIAGNOSIS — H34831 Tributary (branch) retinal vein occlusion, right eye, with macular edema: Secondary | ICD-10-CM | POA: Diagnosis not present

## 2017-06-02 DIAGNOSIS — H348322 Tributary (branch) retinal vein occlusion, left eye, stable: Secondary | ICD-10-CM | POA: Diagnosis not present

## 2017-06-02 MED ORDER — HYDRALAZINE HCL 50 MG PO TABS: 100.0000 mg | ORAL_TABLET | Freq: Three times a day (TID) | ORAL | 3 refills | Status: DC

## 2017-06-02 NOTE — Telephone Encounter (Signed)
°*  STAT* If patient is at the pharmacy, call can be transferred to refill team.   1. Which medications need to be refilled? (please list name of each medication and dose if known)  hydrALAZINE (APRESOLINE) 50 MG tablet [[id number]]   2. Which pharmacy/location (including street and city if local pharmacy) is medication to be sent to? [place]  3. Do they need a 30 day or 90 day supply?  90 day

## 2017-06-04 ENCOUNTER — Other Ambulatory Visit: Payer: Self-pay | Admitting: "Endocrinology

## 2017-06-04 ENCOUNTER — Other Ambulatory Visit: Payer: Self-pay | Admitting: Obstetrics and Gynecology

## 2017-06-04 DIAGNOSIS — Z1231 Encounter for screening mammogram for malignant neoplasm of breast: Secondary | ICD-10-CM

## 2017-06-17 ENCOUNTER — Other Ambulatory Visit: Payer: Self-pay | Admitting: Cardiovascular Disease

## 2017-06-21 ENCOUNTER — Ambulatory Visit (HOSPITAL_COMMUNITY)
Admission: RE | Admit: 2017-06-21 | Discharge: 2017-06-21 | Disposition: A | Discharge status: Home / Self Care | Payer: PPO | Source: Ambulatory Visit | Attending: Obstetrics and Gynecology | Admitting: Obstetrics and Gynecology

## 2017-06-21 DIAGNOSIS — Z1231 Encounter for screening mammogram for malignant neoplasm of breast: Secondary | ICD-10-CM | POA: Diagnosis not present

## 2017-06-21 DIAGNOSIS — E538 Deficiency of other specified B group vitamins: Secondary | ICD-10-CM | POA: Diagnosis not present

## 2017-06-24 ENCOUNTER — Ambulatory Visit (INDEPENDENT_AMBULATORY_CARE_PROVIDER_SITE_OTHER): Payer: PPO | Admitting: *Deleted

## 2017-06-24 DIAGNOSIS — I495 Sick sinus syndrome: Secondary | ICD-10-CM | POA: Diagnosis not present

## 2017-06-25 ENCOUNTER — Ambulatory Visit: Payer: PPO | Admitting: Cardiovascular Disease

## 2017-06-25 ENCOUNTER — Encounter: Payer: Self-pay | Admitting: Cardiology

## 2017-06-25 NOTE — Progress Notes (Signed)
Remote pacemaker transmission.   

## 2017-07-01 LAB — CUP PACEART REMOTE DEVICE CHECK
Battery Remaining Longevity: 109 mo
Battery Voltage: 3.02 V
Brady Statistic AP VP Percent: 0.05 %
Brady Statistic RA Percent Paced: 94.54 %
Brady Statistic RV Percent Paced: 0.06 %
Date Time Interrogation Session: 20190103140754
Implantable Lead Implant Date: 20180104
Implantable Lead Location: 753860
Implantable Pulse Generator Implant Date: 20180104
Lead Channel Impedance Value: 323 Ohm
Lead Channel Impedance Value: 399 Ohm
Lead Channel Pacing Threshold Amplitude: 0.625 V
Lead Channel Pacing Threshold Pulse Width: 0.4 ms
Lead Channel Pacing Threshold Pulse Width: 0.4 ms
Lead Channel Sensing Intrinsic Amplitude: 2.5 mV
Lead Channel Setting Pacing Amplitude: 2 V
Lead Channel Setting Sensing Sensitivity: 2.8 mV
MDC IDC LEAD IMPLANT DT: 20180104
MDC IDC LEAD LOCATION: 753859
MDC IDC MSMT LEADCHNL RA IMPEDANCE VALUE: 342 Ohm
MDC IDC MSMT LEADCHNL RA IMPEDANCE VALUE: 456 Ohm
MDC IDC MSMT LEADCHNL RA SENSING INTR AMPL: 2.5 mV
MDC IDC MSMT LEADCHNL RV PACING THRESHOLD AMPLITUDE: 1 V
MDC IDC MSMT LEADCHNL RV SENSING INTR AMPL: 10.75 mV
MDC IDC MSMT LEADCHNL RV SENSING INTR AMPL: 10.75 mV
MDC IDC SET LEADCHNL RV PACING AMPLITUDE: 2.5 V
MDC IDC SET LEADCHNL RV PACING PULSEWIDTH: 0.4 ms
MDC IDC STAT BRADY AP VS PERCENT: 94.49 %
MDC IDC STAT BRADY AS VP PERCENT: 0.01 %
MDC IDC STAT BRADY AS VS PERCENT: 5.45 %

## 2017-07-05 ENCOUNTER — Encounter: Payer: Self-pay | Admitting: Cardiology

## 2017-07-07 DIAGNOSIS — H34831 Tributary (branch) retinal vein occlusion, right eye, with macular edema: Secondary | ICD-10-CM | POA: Diagnosis not present

## 2017-07-07 DIAGNOSIS — H33301 Unspecified retinal break, right eye: Secondary | ICD-10-CM | POA: Diagnosis not present

## 2017-07-07 DIAGNOSIS — H43811 Vitreous degeneration, right eye: Secondary | ICD-10-CM | POA: Diagnosis not present

## 2017-07-07 DIAGNOSIS — E113411 Type 2 diabetes mellitus with severe nonproliferative diabetic retinopathy with macular edema, right eye: Secondary | ICD-10-CM | POA: Diagnosis not present

## 2017-07-14 ENCOUNTER — Other Ambulatory Visit: Payer: Self-pay | Admitting: Cardiovascular Disease

## 2017-07-15 ENCOUNTER — Other Ambulatory Visit: Payer: Self-pay | Admitting: Cardiovascular Disease

## 2017-07-15 MED ORDER — CARVEDILOL 12.5 MG PO TABS: 12.5000 mg | ORAL_TABLET | Freq: Two times a day (BID) | ORAL | 3 refills | Status: DC

## 2017-07-15 MED ORDER — CARVEDILOL 25 MG PO TABS: 25.0000 mg | ORAL_TABLET | Freq: Two times a day (BID) | ORAL | 3 refills | Status: DC

## 2017-07-15 NOTE — Telephone Encounter (Addendum)
Refilled coreg 25 mg bid per request to [facility] pharmacy,see pt email from 03/2018 stating pt may increase coreg to 25 mg BID

## 2017-07-15 NOTE — Telephone Encounter (Signed)
Please call [facility] concerning the pt's carvedilol (COREG) 12.5 MG tablet [[id number]] ENDED

## 2017-07-15 NOTE — Addendum Note (Signed)
Addended by: Barbarann Ehlers A on: 07/15/2017 12:02 PM   Modules accepted: Orders

## 2017-07-16 ENCOUNTER — Ambulatory Visit: Payer: PPO | Admitting: Cardiovascular Disease

## 2017-07-16 ENCOUNTER — Encounter: Payer: Self-pay | Admitting: Cardiovascular Disease

## 2017-07-16 ENCOUNTER — Other Ambulatory Visit: Payer: Self-pay

## 2017-07-16 VITALS — BP 128/62 | HR 82 | Ht 63.0 in | Wt 161.4 lb

## 2017-07-16 DIAGNOSIS — I10 Essential (primary) hypertension: Secondary | ICD-10-CM | POA: Diagnosis not present

## 2017-07-16 DIAGNOSIS — R0609 Other forms of dyspnea: Secondary | ICD-10-CM

## 2017-07-16 DIAGNOSIS — I313 Pericardial effusion (noninflammatory): Secondary | ICD-10-CM

## 2017-07-16 DIAGNOSIS — Z955 Presence of coronary angioplasty implant and graft: Secondary | ICD-10-CM | POA: Diagnosis not present

## 2017-07-16 DIAGNOSIS — I25118 Atherosclerotic heart disease of native coronary artery with other forms of angina pectoris: Secondary | ICD-10-CM | POA: Diagnosis not present

## 2017-07-16 DIAGNOSIS — G4733 Obstructive sleep apnea (adult) (pediatric): Secondary | ICD-10-CM

## 2017-07-16 DIAGNOSIS — Z79899 Other long term (current) drug therapy: Secondary | ICD-10-CM | POA: Diagnosis not present

## 2017-07-16 DIAGNOSIS — I3139 Other pericardial effusion (noninflammatory): Secondary | ICD-10-CM

## 2017-07-16 DIAGNOSIS — Z950 Presence of cardiac pacemaker: Secondary | ICD-10-CM | POA: Diagnosis not present

## 2017-07-16 DIAGNOSIS — I4891 Unspecified atrial fibrillation: Secondary | ICD-10-CM | POA: Diagnosis not present

## 2017-07-16 NOTE — Patient Instructions (Signed)
Your physician recommends that you schedule a follow-up appointment in:  3 months with [facility]     Your physician recommends that you continue on your current medications as directed. Please refer to the Current Medication list given to you today.    If you need a refill on your cardiac medications before your next appointment, please call your pharmacy.    Your physician has recommended that you have a pulmonary function test. Pulmonary Function Tests are a group of tests that measure how well air moves in and out of your lungs.     Thank you for choosing [facility] !

## 2017-07-16 NOTE — Progress Notes (Signed)
SUBJECTIVE: The patient presents for routine follow-up. She has coronary artery disease, atrial fibrillation, and chronic diastolic heart failure.She underwent pacemaker placement for symptomatic bradycardia in January 2018. This led to pericardial tamponadeand she underwent pericardiocentesis with removal of 450 mL of bloody fluid.  Echocardiogram 08/03/16 demonstrated normal left ventricular systolic function, LVEF 81-44%, ischemic wall motion abnormalities, grade 2 diastolic dysfunction with elevated filling pressures, and a trivial pericardial effusion.  She had some fatigue and shortness of breath when she saw Dr. Curt Bears and he reduced the carvedilol dose to improve symptoms.  However, her blood pressure began to rise and carvedilol had to be increased.  She denies exertional chest pain but she has been expressing progressive exertional dyspnea.  She gets short of breath when walking from her house to her mailbox.  She has had 3 or 4 "spells "where she has some chest related symptoms which she says is not pain or pressure.  She then has dizziness.  She denies syncope but at times feels like she may pass out.  She has checked her blood pressure during these spells they have been normal.      Review of Systems: As per "subjective", otherwise negative.  Allergies  Allergen Reactions  . Penicillins Hives    Has patient had a PCN reaction causing immediate rash, facial/tongue/throat swelling, SOB or lightheadedness with hypotension: Yes Has patient had a PCN reaction causing severe rash involving mucus membranes or skin necrosis: No Has patient had a PCN reaction that required hospitalization No Has patient had a PCN reaction occurring within the last 10 years: No If all of the above answers are "NO", then may proceed with Cephalosporin use.  [name] Percocet [Oxycodone-Acetaminophen] Nausea And Vomiting    Current Outpatient Medications  Medication Sig Dispense Refill  .  acetaminophen (TYLENOL) 325 MG tablet Take 650 mg by mouth every 6 (six) hours as needed for headache.    [name] amiodarone (PACERONE) 200 MG tablet Take 1 tablet (200 mg total) by mouth daily. 30 tablet 6  . atorvastatin (LIPITOR) 80 MG tablet Take 1 tablet (80 mg total) by mouth daily at 6 PM. 30 tablet 6  . carvedilol (COREG) 25 MG tablet Take 1 tablet (25 mg total) by mouth 2 (two) times daily. 180 tablet 3  . cholecalciferol (VITAMIN D) 1000 UNITS tablet Take 1,000 Units by mouth at bedtime.     . clopidogrel (PLAVIX) 75 MG tablet TAKE ONE (1) TABLET BY MOUTH EVERY DAY 90 tablet 3  . diltiazem (CARDIZEM CD) 360 MG 24 hr capsule Take 1 capsule (360 mg total) by mouth daily. 30 capsule 11  . diphenhydramine-acetaminophen (TYLENOL PM EXTRA STRENGTH) 25-500 MG TABS tablet Take 1 tablet by mouth at bedtime as needed (Take as directed).     [name] ELIQUIS 5 MG TABS tablet TAKE (1) TABLET BY MOUTH TWICE DAILY. 180 tablet 0  . furosemide (LASIX) 40 MG tablet TAKE ONE TABLET BY MOUTH ONCE DAILY 90 tablet 3  . glucose blood (FREESTYLE LITE) test strip USE AS DIRECTED TWICE DAILY. 100 each 5  . hydrALAZINE (APRESOLINE) 50 MG tablet Take 2 tablets (100 mg total) by mouth 3 (three) times daily. 270 tablet 3  . insulin NPH-regular Human (NOVOLIN 70/30) (70-30) 100 UNIT/ML injection Inject 25 Units into the skin See admin instructions. Inject 25 units before breakfast and 25 units before supper  - only if pre-meal blood glucose is above 90 mg/dL.    [name] levothyroxine (SYNTHROID, LEVOTHROID) 125  MCG tablet Take 1 tablet (125 mcg total) daily before breakfast by mouth. 30 tablet 3  . losartan (COZAAR) 100 MG tablet TAKE ONE (1) TABLET EACH DAY 90 tablet 3  . magnesium oxide (MAG-OX) 400 MG tablet Take 400 mg by mouth daily.    . metFORMIN (GLUCOPHAGE) 500 MG tablet TAKE ONE TABLET BY MOUTH TWICE A DAY WITH A MEAL 180 tablet 0  . nitroGLYCERIN (NITROSTAT) 0.4 MG SL tablet Place 1 tablet (0.4 mg total) under the tongue every  5 (five) minutes as needed for chest pain. 25 tablet 12  . Omega-3 Fatty Acids (FISH OIL) 1200 MG CAPS Take 2 capsules (2,400 mg total) by mouth 2 (two) times daily. 120 each 3  . potassium chloride SA (K-DUR,KLOR-CON) 20 MEQ tablet TAKE ONE (1) TABLET BY MOUTH EVERY DAY 90 tablet 3  . rOPINIRole (REQUIP) 0.5 MG tablet Take 0.5 mg by mouth at bedtime.   0   No current facility-administered medications for this visit.     Past Medical History:  Diagnosis Date  . Arthritis   . Atrial fibrillation and flutter ([place])    a. h/o PAF/flutter during admission in 2013 for PNA. b. PAF during adm for NSTEMI 07/2015, subsequent paroxysms since then.  . B12 deficiency anemia   . Cardiac tamponade 06/2016  . Coronary artery disease 11/30/2014   a. remote MI. b. h/o PTCA with scoring balloon to OM1 11/2014. c. NSTEMI 03/2015 s/p DES to prox-mid Cx. d. NSTEMI 07/2015 s/p scoring balloon/PTCA/DES to dRCA with PAF during that admission  . Cutaneous lupus erythematosus   . GERD (gastroesophageal reflux disease)   . Heart block   . History of blood transfusion 1980's   2nd surgical procedures  . HTN (hypertension)   . Hypercholesteremia   . Hypothyroidism   . Myocardial infarction ([place]) 02/2012  . Ovarian tumor   . PAD (peripheral artery disease) ([place])    a. s/p LE angio 2015; followed by Dr. Fletcher Anon - managed medically.  . Pain with urination 05/08/2015  . Paroxysmal atrial flutter ([place])   . Pericardial effusion    a. 06/2016 after ppm - s/p pericardiocentesis.  . Superficial fungus infection of skin 06/29/2013  . Tachy-brady syndrome ([place])    a. s/p Medtronic PPM 06/2016, c/b lead perf/pericardial effusion.  [name] TIA (transient ischemic attack) 08/2001; ~ 2006  . Type II diabetes mellitus ([place])   . UTI (urinary tract infection) 05/08/2013    Past Surgical History:  Procedure Laterality Date  . ABDOMINAL AORTAGRAM N/A 01/03/2014   Procedure: ABDOMINAL [name];  Surgeon: Wellington Hampshire, MD;  Location: [facility]  CATH [facility];  Service: Cardiovascular;  Laterality: N/A;  . ABDOMINAL HYSTERECTOMY  1972   "partial"  . APPENDECTOMY  1970's  . CARDIAC CATHETERIZATION  2008   Tiny OM-2 with 90% narrowing. Med tx.  [name] CARDIAC CATHETERIZATION N/A 11/30/2014   Procedure: Left Heart Cath and Coronary Angiography;  Surgeon: Troy Sine, MD; LAD 20%, CFX 50%, OM1 95%, right PLB 30%, LV normal   . CARDIAC CATHETERIZATION N/A 11/30/2014   Procedure: Coronary Balloon Angioplasty;  Surgeon: Troy Sine, MD;  Angiosculpt scoring balloon and PTCA to the OM1 reducing stenosis from 95% to less than 10%  . CARDIAC CATHETERIZATION N/A 04/03/2015   Procedure: Left Heart Cath and Coronary Angiography;  Surgeon: Jolaine Artist, MD; dLAD 50%, CFX 90%, OM1 100%, PLA 15%, LVEDP 13    . CARDIAC CATHETERIZATION N/A 04/03/2015   Procedure: Coronary Stent Intervention;  Surgeon: Sherren Mocha, MD; 3.0x18 mm Xience DES to the CFX    . CARDIAC CATHETERIZATION N/A 08/02/2015   Procedure: Left Heart Cath and Coronary Angiography;  Surgeon: Troy Sine, MD;  Location: [facility];  Service: Cardiovascular;  Laterality: N/A;  . CARDIAC CATHETERIZATION N/A 08/02/2015   Procedure: Coronary Stent Intervention;  Surgeon: Troy Sine, MD;  Location: [facility];  Service: Cardiovascular;  Laterality: N/A;  . CARDIAC CATHETERIZATION N/A 06/25/2016   Procedure: Pericardiocentesis;  Surgeon: Will Meredith Leeds, MD;  Location: [facility];  Service: Cardiovascular;  Laterality: N/A;  . cardiac stents    . CHOLECYSTECTOMY OPEN  1990's  . COLONOSCOPY  2005   Dr. Laural Golden: pancolonic divericula, polyp, path unknown currently  . COLONOSCOPY  2012   Dr. Oneida Alar: Normal TI, scattered diverticula in entire colon, small internal hemorrhoids, normal colon biopsies. Colonoscopy in 5-10 years.   . COLOSTOMY  05/1979  . COLOSTOMY REVERSAL  11/1979  . EP IMPLANTABLE DEVICE N/A 06/25/2016   Procedure: Lead Revision/Repair;  Surgeon:  Will Meredith Leeds, MD;  Location: [facility];  Service: Cardiovascular;  Laterality: N/A;  . EP IMPLANTABLE DEVICE N/A 06/25/2016   Procedure: Pacemaker Implant;  Surgeon: Will Meredith Leeds, MD;  Location: [facility];  Service: Cardiovascular;  Laterality: N/A;  . EXCISIONAL HEMORRHOIDECTOMY  1970's  . EYE SURGERY Left 2000   "branch vein occlusion"  . EYE SURGERY Left ~ 2001   "smoothed out wrinkle"  . LEFT OOPHORECTOMY  05/1979   nicked bowel, peritonitis, colostomy; colostomy reversed 1981   . LOWER EXTREMITY ANGIOGRAM N/A 01/03/2014   Procedure: LOWER EXTREMITY ANGIOGRAM;  Surgeon: Wellington Hampshire, MD;  Location: [facility];  Service: Cardiovascular;  Laterality: N/A;  . Nuclear med stress test  10/2011   Small area of mild ischemia inferoapically.  [name] PARTIAL HYSTERECTOMY  1970's   left ovaries, then ovaries removed later due tumors   . RIGHT OOPHORECTOMY  1970's    Social History   Socioeconomic History  . Marital status: Married    Spouse name: Not on file  . Number of children: Not on file  . Years of education: Not on file  . Highest education level: Not on file  Social Needs  . Financial resource strain: Not on file  . Food insecurity - worry: Not on file  . Food insecurity - inability: Not on file  . Transportation needs - medical: Not on file  . Transportation needs - non-medical: Not on file  Occupational History  . Occupation: Retired    Fish farm manager: RETIRED    Comment: Research officer, political party  Tobacco Use  . Smoking status: Never Smoker  . Smokeless tobacco: Never Used  . Tobacco comment: Never smoked  Substance and Sexual Activity  . Alcohol use: No    Alcohol/week: 0.0 oz  . Drug use: No  . Sexual activity: No    Birth control/protection: Surgical    Comment: hyst  Other Topics Concern  . Not on file  Social History Narrative  . Not on file     Vitals:   07/16/17 1055  BP: 128/62  Pulse: 82  SpO2: 97%  Weight: 161 lb 6.4 oz (73.2  kg)  Height: 5\' 3"  (1.6 m)    Wt Readings from Last 3 Encounters:  07/16/17 161 lb 6.4 oz (73.2 kg)  05/10/17 157 lb (71.2 kg)  03/25/17 158 lb (71.7 kg)     PHYSICAL EXAM General:  NAD HEENT: Normal. Neck: No JVD, no thyromegaly. Lungs: Clear to auscultation bilaterally with normal respiratory effort. CV: Regular rate and rhythm, normal S1/S2, no S3/S4, no murmur. No pretibial or periankle edema.  No carotid bruit.   Abdomen: Soft, nontender, no distention.  Neurologic: Alert and oriented.  Psych: Normal affect. Skin: Normal. Musculoskeletal: No gross deformities.    ECG: Most recent ECG reviewed.   Labs: Lab Results  Component Value Date/Time   K 4.5 04/30/2017 08:07 AM   BUN 17 04/30/2017 08:07 AM   BUN 21 06/23/2016 03:50 PM   CREATININE 1.12 (H) 04/30/2017 08:07 AM   ALT 17 01/25/2017 09:22 AM   TSH 4.97 (H) 04/30/2017 08:07 AM   HGB 8.6 (L) 06/28/2016 02:46 AM   HGB 12.4 06/23/2016 03:50 PM     Lipids: Lab Results  Component Value Date/Time   LDLCALC NOT CALC 04/14/2016 09:10 AM   CHOL 116 (L) 04/14/2016 09:10 AM   TRIG 439 (H) 04/14/2016 09:10 AM   HDL 24 (L) 04/14/2016 09:10 AM       ASSESSMENT AND PLAN: 1. CAD with prior PCI's: Symptomatically stable. Continue Lipitor, carvedilol, andPlavix.  2. Malignant HTN: Controlled.  No changes to therapy.  3. Hyperlipidemia: I will obtain a copy of lipids from PCP.  Continue Lipitor 80 mg daily. Also takes fish oil.  4. Chronic diastolic heart failure: Euvolemic. Continue Lasix 40 mg daily.  5. Atrial fibrillation: Currently in a regularrhythm. Continue amiodarone and Eliquis as well as long-acting diltiazem.  TSH 4.97 on 04/30/17.  Liver transaminases normal on 01/25/17.  6. Pacemaker: Normal function when checked earlier this month.  7. Pericardial effusion: Resolved. Echo reviewed above.  8. OSA: Likely driving hypertension. She was not able to tolerate CPAP. I previously asked her to speak  with her sleep specialist about obtaining an oral mandibular advancement appliance.   9.  Progressive exertional dyspnea: She is euvolemic with no pulmonary edema, leg swelling, or abdominal distention.  Her weight is only up 3 pounds since March 25, 2017.  She said her appetite has not increased.   I reviewed her coronary angiogram from February 2017 and there were no lesions which were close to being hemodynamically significant other than the stented lesion. She is on amiodarone.  I will obtain pulmonary function tests.    Disposition: Follow up 3 months   Kate Sable, M.D., F.A.C.C.

## 2017-07-23 DIAGNOSIS — E539 Vitamin B deficiency, unspecified: Secondary | ICD-10-CM | POA: Diagnosis not present

## 2017-07-27 ENCOUNTER — Ambulatory Visit (HOSPITAL_COMMUNITY)
Admission: RE | Admit: 2017-07-27 | Discharge: 2017-07-27 | Disposition: A | Discharge status: Home / Self Care | Payer: PPO | Source: Ambulatory Visit | Attending: Cardiovascular Disease | Admitting: Cardiovascular Disease

## 2017-07-27 ENCOUNTER — Telehealth: Payer: Self-pay | Admitting: Cardiovascular Disease

## 2017-07-27 DIAGNOSIS — R0609 Other forms of dyspnea: Secondary | ICD-10-CM | POA: Diagnosis not present

## 2017-07-27 DIAGNOSIS — Z79899 Other long term (current) drug therapy: Secondary | ICD-10-CM | POA: Diagnosis not present

## 2017-07-27 LAB — PULMONARY FUNCTION TEST
DL/VA % pred: 74 %
DL/VA: 3.49 ml/min/mmHg/L
DLCO UNC: 12.92 ml/min/mmHg
DLCO unc % pred: 56 %
FEF 25-75 Post: 2.5 L/sec
FEF 25-75 Pre: 1.68 L/sec
FEF2575-%Change-Post: 48 %
FEF2575-%PRED-POST: 143 %
FEF2575-%Pred-Pre: 96 %
FEV1-%CHANGE-POST: 12 %
FEV1-%PRED-PRE: 79 %
FEV1-%Pred-Post: 89 %
FEV1-POST: 1.88 L
FEV1-PRE: 1.68 L
FEV1FVC-%CHANGE-POST: 2 %
FEV1FVC-%Pred-Pre: 105 %
FEV6-%Change-Post: 8 %
FEV6-%PRED-PRE: 79 %
FEV6-%Pred-Post: 85 %
FEV6-POST: 2.29 L
FEV6-Pre: 2.11 L
FEV6FVC-%PRED-POST: 105 %
FEV6FVC-%Pred-Pre: 105 %
FVC-%Change-Post: 9 %
FVC-%PRED-POST: 82 %
FVC-%PRED-PRE: 75 %
FVC-POST: 2.3 L
FVC-PRE: 2.11 L
POST FEV6/FVC RATIO: 100 %
PRE FEV6/FVC RATIO: 100 %
Post FEV1/FVC ratio: 82 %
Pre FEV1/FVC ratio: 79 %
RV % PRED: 86 %
RV: 1.89 L
TLC % PRED: 88 %
TLC: 4.33 L

## 2017-07-27 MED ORDER — DILTIAZEM HCL ER COATED BEADS 360 MG PO CP24: 360.0000 mg | ORAL_CAPSULE | Freq: Every day | ORAL | 11 refills | Status: DC

## 2017-07-27 MED ORDER — ALBUTEROL SULFATE (2.5 MG/3ML) 0.083% IN NEBU
2.5000 mg | INHALATION_SOLUTION | Freq: Once | RESPIRATORY_TRACT | Status: AC
  Administered 2017-07-27: 2.5 mg via RESPIRATORY_TRACT

## 2017-07-27 NOTE — Telephone Encounter (Signed)
Please call in Diltiazem 360mg  QD to [facility]/tg

## 2017-07-27 NOTE — Telephone Encounter (Signed)
Refill complete 

## 2017-07-29 ENCOUNTER — Telehealth: Payer: Self-pay

## 2017-07-29 DIAGNOSIS — R0602 Shortness of breath: Secondary | ICD-10-CM

## 2017-07-29 MED ORDER — ALBUTEROL SULFATE HFA 108 (90 BASE) MCG/ACT IN AERS: 2.0000 | INHALATION_SPRAY | Freq: Four times a day (QID) | RESPIRATORY_TRACT | 2 refills | Status: DC | PRN

## 2017-07-29 NOTE — Telephone Encounter (Signed)
-----   Message from Herminio Commons, MD sent at 07/28/2017  9:46 AM EST ----- Only mild ventilatory defect (no significant obstructive lung disease). Please provide albuterol inhaler 1-2 puffs q 6 hours prn. Let's also repeat echocardiogram to assess for interval changes given her shortness of breath.

## 2017-07-29 NOTE — Telephone Encounter (Signed)
Pt given pft results, will start albuterol inhaler,ordered echo

## 2017-08-04 ENCOUNTER — Other Ambulatory Visit: Payer: Self-pay | Admitting: Cardiovascular Disease

## 2017-08-04 ENCOUNTER — Ambulatory Visit (HOSPITAL_COMMUNITY)
Admission: RE | Admit: 2017-08-04 | Discharge: 2017-08-04 | Disposition: A | Discharge status: Home / Self Care | Payer: PPO | Source: Ambulatory Visit | Attending: Cardiovascular Disease | Admitting: Cardiovascular Disease

## 2017-08-04 DIAGNOSIS — I119 Hypertensive heart disease without heart failure: Secondary | ICD-10-CM | POA: Insufficient documentation

## 2017-08-04 DIAGNOSIS — I4891 Unspecified atrial fibrillation: Secondary | ICD-10-CM | POA: Insufficient documentation

## 2017-08-04 DIAGNOSIS — I4892 Unspecified atrial flutter: Secondary | ICD-10-CM | POA: Diagnosis not present

## 2017-08-04 DIAGNOSIS — I209 Angina pectoris, unspecified: Secondary | ICD-10-CM | POA: Insufficient documentation

## 2017-08-04 DIAGNOSIS — R0602 Shortness of breath: Secondary | ICD-10-CM

## 2017-08-04 DIAGNOSIS — E119 Type 2 diabetes mellitus without complications: Secondary | ICD-10-CM | POA: Diagnosis not present

## 2017-08-04 NOTE — Progress Notes (Signed)
*  PRELIMINARY RESULTS* Echocardiogram 2D Echocardiogram has been performed.  Virginia Huber 08/04/2017, 11:10 AM

## 2017-08-05 DIAGNOSIS — E039 Hypothyroidism, unspecified: Secondary | ICD-10-CM | POA: Diagnosis not present

## 2017-08-05 DIAGNOSIS — E782 Mixed hyperlipidemia: Secondary | ICD-10-CM | POA: Diagnosis not present

## 2017-08-05 DIAGNOSIS — E119 Type 2 diabetes mellitus without complications: Secondary | ICD-10-CM | POA: Diagnosis not present

## 2017-08-05 DIAGNOSIS — E1159 Type 2 diabetes mellitus with other circulatory complications: Secondary | ICD-10-CM | POA: Diagnosis not present

## 2017-08-06 LAB — COMPLETE METABOLIC PANEL WITH GFR
AG RATIO: 2.2 (calc) (ref 1.0–2.5)
ALBUMIN MSPROF: 4.4 g/dL (ref 3.6–5.1)
ALT: 27 U/L (ref 6–29)
AST: 16 U/L (ref 10–35)
Alkaline phosphatase (APISO): 85 U/L (ref 33–130)
BILIRUBIN TOTAL: 0.3 mg/dL (ref 0.2–1.2)
BUN / CREAT RATIO: 16 (calc) (ref 6–22)
BUN: 20 mg/dL (ref 7–25)
CHLORIDE: 104 mmol/L (ref 98–110)
CO2: 27 mmol/L (ref 20–32)
Calcium: 9.3 mg/dL (ref 8.6–10.4)
Creat: 1.24 mg/dL — ABNORMAL HIGH (ref 0.60–0.93)
GFR, EST AFRICAN AMERICAN: 50 mL/min/{1.73_m2} — AB (ref >=60)
GFR, Est Non African American: 43 mL/min/{1.73_m2} — ABNORMAL LOW (ref >=60)
GLOBULIN: 2 g/dL (ref 1.9–3.7)
GLUCOSE: 139 mg/dL — AB (ref 65–99)
POTASSIUM: 3.8 mmol/L (ref 3.5–5.3)
SODIUM: 141 mmol/L (ref 135–146)
Total Protein: 6.4 g/dL (ref 6.1–8.1)

## 2017-08-06 LAB — LIPID PANEL
Cholesterol: 166 mg/dL (ref <200)
HDL: 43 mg/dL — ABNORMAL LOW (ref >50)
LDL CHOLESTEROL (CALC): 96 mg/dL
Non-HDL Cholesterol (Calc): 123 mg/dL (calc) (ref <130)
Total CHOL/HDL Ratio: 3.9 (calc) (ref <5.0)
Triglycerides: 160 mg/dL — ABNORMAL HIGH (ref <150)

## 2017-08-06 LAB — VITAMIN D 25 HYDROXY (VIT D DEFICIENCY, FRACTURES): VIT D 25 HYDROXY: 22 ng/mL — AB (ref 30–100)

## 2017-08-06 LAB — TSH: TSH: 12.65 m[IU]/L — AB (ref 0.40–4.50)

## 2017-08-06 LAB — HEMOGLOBIN A1C
Hgb A1c MFr Bld: 6.6 % of total Hgb — ABNORMAL HIGH (ref <5.7)
MEAN PLASMA GLUCOSE: 143 (calc)
eAG (mmol/L): 7.9 (calc)

## 2017-08-06 LAB — T4, FREE: Free T4: 1.8 ng/dL (ref 0.8–1.8)

## 2017-08-07 ENCOUNTER — Other Ambulatory Visit: Payer: Self-pay | Admitting: "Endocrinology

## 2017-08-09 ENCOUNTER — Other Ambulatory Visit: Payer: Self-pay

## 2017-08-09 MED ORDER — DILTIAZEM HCL ER COATED BEADS 360 MG PO CP24: 360.0000 mg | ORAL_CAPSULE | Freq: Every day | ORAL | 3 refills | Status: DC

## 2017-08-10 ENCOUNTER — Other Ambulatory Visit: Payer: Self-pay

## 2017-08-10 MED ORDER — LEVOTHYROXINE SODIUM 125 MCG PO TABS: ORAL_TABLET | ORAL | 1 refills | Status: DC

## 2017-08-11 ENCOUNTER — Other Ambulatory Visit: Payer: Self-pay | Admitting: "Endocrinology

## 2017-08-11 DIAGNOSIS — H43811 Vitreous degeneration, right eye: Secondary | ICD-10-CM | POA: Diagnosis not present

## 2017-08-11 DIAGNOSIS — H34831 Tributary (branch) retinal vein occlusion, right eye, with macular edema: Secondary | ICD-10-CM | POA: Diagnosis not present

## 2017-08-11 DIAGNOSIS — E113411 Type 2 diabetes mellitus with severe nonproliferative diabetic retinopathy with macular edema, right eye: Secondary | ICD-10-CM | POA: Diagnosis not present

## 2017-08-12 ENCOUNTER — Encounter: Payer: Self-pay | Admitting: Nutrition

## 2017-08-12 ENCOUNTER — Ambulatory Visit (INDEPENDENT_AMBULATORY_CARE_PROVIDER_SITE_OTHER): Payer: PPO | Admitting: "Endocrinology

## 2017-08-12 ENCOUNTER — Encounter: Payer: Self-pay | Admitting: "Endocrinology

## 2017-08-12 ENCOUNTER — Encounter: Payer: PPO | Attending: Family Medicine | Admitting: Nutrition

## 2017-08-12 VITALS — BP 139/76 | HR 83 | Ht 63.0 in | Wt 158.0 lb

## 2017-08-12 VITALS — Ht 63.0 in | Wt 158.0 lb

## 2017-08-12 DIAGNOSIS — E782 Mixed hyperlipidemia: Secondary | ICD-10-CM | POA: Diagnosis not present

## 2017-08-12 DIAGNOSIS — IMO0002 Reserved for concepts with insufficient information to code with codable children: Secondary | ICD-10-CM

## 2017-08-12 DIAGNOSIS — E1165 Type 2 diabetes mellitus with hyperglycemia: Secondary | ICD-10-CM | POA: Diagnosis not present

## 2017-08-12 DIAGNOSIS — I10 Essential (primary) hypertension: Secondary | ICD-10-CM | POA: Diagnosis not present

## 2017-08-12 DIAGNOSIS — E038 Other specified hypothyroidism: Secondary | ICD-10-CM

## 2017-08-12 DIAGNOSIS — E118 Type 2 diabetes mellitus with unspecified complications: Secondary | ICD-10-CM

## 2017-08-12 DIAGNOSIS — E1159 Type 2 diabetes mellitus with other circulatory complications: Secondary | ICD-10-CM | POA: Diagnosis not present

## 2017-08-12 DIAGNOSIS — Z713 Dietary counseling and surveillance: Secondary | ICD-10-CM | POA: Diagnosis not present

## 2017-08-12 NOTE — Patient Instructions (Addendum)
Goals1 Increase time riding bike Continue to watch diet Cut back on cheese and higher salter foods. Lose 2 lbs per month

## 2017-08-12 NOTE — Progress Notes (Signed)
  Diabetes Self-Management Education  Visit Type:     Appt. Start Time: 1000 Appt. End Time: 1030  08/12/2017  [name], identified by name and date of birth, is a 72 y.o. female with a diagnosis of Diabetes:  .  She reports following the meal plan much better. She is riding her exercise bike 1-2 miles per day most days. FBS 90-130's. Taking 25 units of 70/30 insulin before breakfast and supper daily. Lost about 3-4 lbs in the last 2 months. A1C 6.6, Down from 6.8%.    ASSESSMENT  Wt Readings from Last 3 Encounters:  08/12/17 158 lb (71.7 kg)  08/12/17 158 lb (71.7 kg)  07/16/17 161 lb 6.4 oz (73.2 kg)   Ht Readings from Last 3 Encounters:  08/12/17 5\' 3"  (1.6 m)  08/12/17 5\' 3"  (1.6 m)  07/16/17 5\' 3"  (1.6 m)   Lab Results  Component Value Date   HGBA1C 6.6 (H) 08/05/2017    Body mass index is 27.99 kg/m.     CMP Latest Ref Rng & Units 08/05/2017 04/30/2017 01/25/2017  Glucose 65 - 99 mg/dL 139(H) 210(H) 178(H)  BUN 7 - 25 mg/dL 20 17 16   Creatinine 0.60 - 0.93 mg/dL 1.24(H) 1.12(H) 1.07(H)  Sodium 135 - 146 mmol/L 141 140 139  Potassium 3.5 - 5.3 mmol/L 3.8 4.5 4.6  Chloride 98 - 110 mmol/L 104 105 103  CO2 20 - 32 mmol/L 27 28 25   Calcium 8.6 - 10.4 mg/dL 9.3 9.1 9.3  Total Protein 6.1 - 8.1 g/dL 6.4 - 6.4  Total Bilirubin 0.2 - 1.2 mg/dL 0.3 - 0.6  Alkaline Phos 33 - 130 U/L - - 62  AST 10 - 35 U/L 16 - 12  ALT 6 - 29 U/L 27 - 17   Lipid Panel     Component Value Date/Time   CHOL 166 08/05/2017 0807   TRIG 160 (H) 08/05/2017 0807   HDL 43 (L) 08/05/2017 0807   CHOLHDL 3.9 08/05/2017 0807   VLDL NOT CALC 04/14/2016 0910   LDLCALC NOT CALC 04/14/2016 0910      Learning Objective:  Patient will have a greater understanding of diabetes self-management. Patient education plan is to attend individual and/or group sessions per assessed needs and concerns.   Plan:  Goals1 Increase time riding bike Continue to watch diet Cut back on cheese and  higher salter foods. Lose 2 lbs per month    Expected Outcomes:    Lower blood sugars and improved health.  Education material provided: My Plate  If problems or questions, patient to contact team via:  Phone and Email  Future DSME appointment: -   3 months.

## 2017-08-12 NOTE — Patient Instructions (Signed)

## 2017-08-12 NOTE — Progress Notes (Signed)
Subjective:    Patient ID: Virginia Huber, female    DOB: 1946-05-11. Patient is being seen in follow-up for management of diabetes requested by  Sharilyn Sites, MD  Past Medical History:  Diagnosis Date  . Arthritis   . Atrial fibrillation and flutter ([place])    a. h/o PAF/flutter during admission in 2013 for PNA. b. PAF during adm for NSTEMI 07/2015, subsequent paroxysms since then.  . B12 deficiency anemia   . Cardiac tamponade 06/2016  . Coronary artery disease 11/30/2014   a. remote MI. b. h/o PTCA with scoring balloon to OM1 11/2014. c. NSTEMI 03/2015 s/p DES to prox-mid Cx. d. NSTEMI 07/2015 s/p scoring balloon/PTCA/DES to dRCA with PAF during that admission  . Cutaneous lupus erythematosus   . GERD (gastroesophageal reflux disease)   . Heart block   . History of blood transfusion 1980's   2nd surgical procedures  . HTN (hypertension)   . Hypercholesteremia   . Hypothyroidism   . Myocardial infarction ([place]) 02/2012  . Ovarian tumor   . PAD (peripheral artery disease) ([place])    a. s/p LE angio 2015; followed by Dr. Fletcher Anon - managed medically.  . Pain with urination 05/08/2015  . Paroxysmal atrial flutter ([place])   . Pericardial effusion    a. 06/2016 after ppm - s/p pericardiocentesis.  . Superficial fungus infection of skin 06/29/2013  . Tachy-brady syndrome ([place])    a. s/p Medtronic PPM 06/2016, c/b lead perf/pericardial effusion.  [name] TIA (transient ischemic attack) 08/2001; ~ 2006  . Type II diabetes mellitus ([place])   . UTI (urinary tract infection) 05/08/2013   Past Surgical History:  Procedure Laterality Date  . ABDOMINAL AORTAGRAM N/A 01/03/2014   Procedure: ABDOMINAL [name];  Surgeon: Wellington Hampshire, MD;  Location: [facility];  Service: Cardiovascular;  Laterality: N/A;  . ABDOMINAL HYSTERECTOMY  1972   "partial"  . APPENDECTOMY  1970's  . CARDIAC CATHETERIZATION  2008   Tiny OM-2 with 90% narrowing. Med tx.  [name] CARDIAC CATHETERIZATION N/A 11/30/2014   Procedure:  Left Heart Cath and Coronary Angiography;  Surgeon: Troy Sine, MD; LAD 20%, CFX 50%, OM1 95%, right PLB 30%, LV normal   . CARDIAC CATHETERIZATION N/A 11/30/2014   Procedure: Coronary Balloon Angioplasty;  Surgeon: Troy Sine, MD;  Angiosculpt scoring balloon and PTCA to the OM1 reducing stenosis from 95% to less than 10%  . CARDIAC CATHETERIZATION N/A 04/03/2015   Procedure: Left Heart Cath and Coronary Angiography;  Surgeon: Jolaine Artist, MD; dLAD 50%, CFX 90%, OM1 100%, PLA 15%, LVEDP 13    . CARDIAC CATHETERIZATION N/A 04/03/2015   Procedure: Coronary Stent Intervention;  Surgeon: Sherren Mocha, MD; 3.0x18 mm Xience DES to the CFX    . CARDIAC CATHETERIZATION N/A 08/02/2015   Procedure: Left Heart Cath and Coronary Angiography;  Surgeon: Troy Sine, MD;  Location: [facility];  Service: Cardiovascular;  Laterality: N/A;  . CARDIAC CATHETERIZATION N/A 08/02/2015   Procedure: Coronary Stent Intervention;  Surgeon: Troy Sine, MD;  Location: [facility];  Service: Cardiovascular;  Laterality: N/A;  . CARDIAC CATHETERIZATION N/A 06/25/2016   Procedure: Pericardiocentesis;  Surgeon: Will Meredith Leeds, MD;  Location: [facility];  Service: Cardiovascular;  Laterality: N/A;  . cardiac stents    . CHOLECYSTECTOMY OPEN  1990's  . COLONOSCOPY  2005   Dr. Laural Golden: pancolonic divericula, polyp, path unknown currently  . COLONOSCOPY  2012   Dr. Oneida Alar: Normal  TI, scattered diverticula in entire colon, small internal hemorrhoids, normal colon biopsies. Colonoscopy in 5-10 years.   . COLOSTOMY  05/1979  . COLOSTOMY REVERSAL  11/1979  . EP IMPLANTABLE DEVICE N/A 06/25/2016   Procedure: Lead Revision/Repair;  Surgeon: Will Meredith Leeds, MD;  Location: [facility];  Service: Cardiovascular;  Laterality: N/A;  . EP IMPLANTABLE DEVICE N/A 06/25/2016   Procedure: Pacemaker Implant;  Surgeon: Will Meredith Leeds, MD;  Location: [facility];  Service:  Cardiovascular;  Laterality: N/A;  . EXCISIONAL HEMORRHOIDECTOMY  1970's  . EYE SURGERY Left 2000   "branch vein occlusion"  . EYE SURGERY Left ~ 2001   "smoothed out wrinkle"  . LEFT OOPHORECTOMY  05/1979   nicked bowel, peritonitis, colostomy; colostomy reversed 1981   . LOWER EXTREMITY ANGIOGRAM N/A 01/03/2014   Procedure: LOWER EXTREMITY ANGIOGRAM;  Surgeon: Wellington Hampshire, MD;  Location: [facility];  Service: Cardiovascular;  Laterality: N/A;  . Nuclear med stress test  10/2011   Small area of mild ischemia inferoapically.  [name] PARTIAL HYSTERECTOMY  1970's   left ovaries, then ovaries removed later due tumors   . RIGHT OOPHORECTOMY  1970's   Social History   Socioeconomic History  . Marital status: Married    Spouse name: None  . Number of children: None  . Years of education: None  . Highest education level: None  Social Needs  . Financial resource strain: None  . Food insecurity - worry: None  . Food insecurity - inability: None  . Transportation needs - medical: None  . Transportation needs - non-medical: None  Occupational History  . Occupation: Retired    Fish farm manager: RETIRED    Comment: Research officer, political party  Tobacco Use  . Smoking status: Never Smoker  . Smokeless tobacco: Never Used  . Tobacco comment: Never smoked  Substance and Sexual Activity  . Alcohol use: No    Alcohol/week: 0.0 oz  . Drug use: No  . Sexual activity: No    Birth control/protection: Surgical    Comment: hyst  Other Topics Concern  . None  Social History Narrative  . None   Outpatient Encounter Medications as of 08/12/2017  Medication Sig  . acetaminophen (TYLENOL) 325 MG tablet Take 650 mg by mouth every 6 (six) hours as needed for headache.  . albuterol (PROVENTIL HFA;VENTOLIN HFA) 108 (90 Base) MCG/ACT inhaler Inhale 2 puffs into the lungs every 6 (six) hours as needed for wheezing or shortness of breath.  [name] amiodarone (PACERONE) 200 MG tablet Take 1 tablet (200 mg total) by mouth  daily.  [name] atorvastatin (LIPITOR) 80 MG tablet Take 1 tablet (80 mg total) by mouth daily at 6 PM.  . carvedilol (COREG) 25 MG tablet Take 1 tablet (25 mg total) by mouth 2 (two) times daily.  . cholecalciferol (VITAMIN D) 1000 UNITS tablet Take 1,000 Units by mouth at bedtime.   . clopidogrel (PLAVIX) 75 MG tablet TAKE ONE (1) TABLET BY MOUTH EVERY DAY  . diltiazem (CARDIZEM CD) 360 MG 24 hr capsule Take 1 capsule (360 mg total) by mouth daily.  . diphenhydramine-acetaminophen (TYLENOL PM EXTRA STRENGTH) 25-500 MG TABS tablet Take 1 tablet by mouth at bedtime as needed (Take as directed).   [name] ELIQUIS 5 MG TABS tablet TAKE (1) TABLET BY MOUTH TWICE DAILY.  . furosemide (LASIX) 40 MG tablet TAKE ONE TABLET BY MOUTH ONCE DAILY  . glucose blood (FREESTYLE LITE) test strip USE AS DIRECTED TWICE DAILY.  . hydrALAZINE (  APRESOLINE) 50 MG tablet TAKE 2 TABLETS (100MG  TOTAL) BY MOUTH THREE TIMES DAILY  . insulin NPH-regular Human (NOVOLIN 70/30) (70-30) 100 UNIT/ML injection Inject 25 Units into the skin See admin instructions. Inject 25 units before breakfast and 25 units before supper  - only if pre-meal blood glucose is above 90 mg/dL.  [name] levothyroxine (SYNTHROID, LEVOTHROID) 125 MCG tablet TAKE ONE TABLET (125MCG TOTAL) BY MOUTH DAILY BEFORE BREAKFAST  . losartan (COZAAR) 100 MG tablet TAKE ONE (1) TABLET EACH DAY  . magnesium oxide (MAG-OX) 400 MG tablet Take 400 mg by mouth daily.  . metFORMIN (GLUCOPHAGE) 500 MG tablet TAKE ONE TABLET BY MOUTH TWICE A DAY WITH A MEAL  . nitroGLYCERIN (NITROSTAT) 0.4 MG SL tablet Place 1 tablet (0.4 mg total) under the tongue every 5 (five) minutes as needed for chest pain.  . Omega-3 Fatty Acids (FISH OIL) 1200 MG CAPS Take 2 capsules (2,400 mg total) by mouth 2 (two) times daily.  . potassium chloride SA (K-DUR,KLOR-CON) 20 MEQ tablet TAKE ONE (1) TABLET BY MOUTH EVERY DAY  . rOPINIRole (REQUIP) 0.5 MG tablet Take 0.5 mg by mouth at bedtime.   . [DISCONTINUED]  levothyroxine (SYNTHROID, LEVOTHROID) 125 MCG tablet TAKE ONE TABLET (125MCG TOTAL) BY MOUTH DAILY BEFORE BREAKFAST   No facility-administered encounter medications on file as of 08/12/2017.    ALLERGIES: VACCINATION STATUS: Immunization History  Administered Date(s) Administered  . Influenza-Unspecified 03/27/2015  . Pneumococcal-Unspecified 02/21/2012    Diabetes  She presents for her follow-up diabetic visit. She has type 2 diabetes mellitus. Onset time: She was diagnosed at approximate age of 35 years. Her disease course has been stable. There are no hypoglycemic associated symptoms. Pertinent negatives for hypoglycemia include no confusion, headaches, pallor, seizures or tremors. Associated symptoms include fatigue. Pertinent negatives for diabetes include no chest pain, no polydipsia, no polyphagia and no polyuria. There are no hypoglycemic complications. Symptoms are stable. Diabetic complications include a CVA and heart disease. Risk factors for coronary artery disease include diabetes mellitus, dyslipidemia, hypertension and sedentary lifestyle. Current diabetic treatments: She is on Novolin 70/30 60 units twice a day, metformin 500 mg 3 times a day. She is compliant with treatment most of the time. Her weight is stable. She is following a generally unhealthy diet. When asked about meal planning, she reported none. She has had a previous visit with a dietitian. She rarely participates in exercise. Home blood sugar record trend: She did not bring the meter to review today. She mentions that she is monitoring randomly and regular.[name] Her breakfast blood glucose range is generally 130-140 mg/dl. Her dinner blood glucose range is generally 130-140 mg/dl. Her overall blood glucose range is 130-140 mg/dl. An ACE inhibitor/angiotensin II receptor blocker is being taken. Eye exam is current.  Hyperlipidemia  This is a chronic problem. The current episode started more than 1 year ago. The problem is  uncontrolled. Recent lipid tests were reviewed and are high. Exacerbating diseases include diabetes and hypothyroidism. Pertinent negatives include no chest pain, myalgias or shortness of breath. Current antihyperlipidemic treatment includes statins, fibric acid derivatives and bile acid squestrants. The current treatment provides moderate improvement of lipids.  Hypertension  This is a chronic problem. The current episode started more than 1 year ago. The problem is uncontrolled. Pertinent negatives include no chest pain, headaches, palpitations or shortness of breath. Risk factors for coronary artery disease include dyslipidemia, diabetes mellitus and sedentary lifestyle. Past treatments include angiotensin blockers. Hypertensive end-organ damage includes CAD/MI and CVA.  Review of Systems  Constitutional: Positive for fatigue. Negative for chills, fever and unexpected weight change.  HENT: Negative for trouble swallowing and voice change.   Eyes: Negative for visual disturbance.  Respiratory: Negative for cough, shortness of breath and wheezing.   Cardiovascular: Negative for chest pain, palpitations and leg swelling.       No Shortness of breath  Gastrointestinal: Negative for abdominal pain, diarrhea, nausea and vomiting.  Endocrine: Negative for cold intolerance, heat intolerance, polydipsia, polyphagia and polyuria.  Genitourinary: Negative for frequency, hematuria and urgency.  Musculoskeletal: Negative for arthralgias and myalgias.  Skin: Negative for color change, pallor, rash and wound.  Neurological: Negative for tremors, seizures and headaches.  Hematological: Does not bruise/bleed easily.  Psychiatric/Behavioral: Negative for confusion, hallucinations and suicidal ideas.    Objective:    BP 139/76   Pulse 83   Ht 5\' 3"  (1.6 m)   Wt 158 lb (71.7 kg)   BMI 27.99 kg/m   Wt Readings from Last 3 Encounters:  08/12/17 158 lb (71.7 kg)  08/12/17 158 lb (71.7 kg)  07/16/17  161 lb 6.4 oz (73.2 kg)    Physical Exam  Constitutional: She is oriented to person, place, and time. She appears well-developed.  HENT:  Head: Normocephalic and atraumatic.  Eyes: EOM are normal.  Neck: Normal range of motion. Neck supple. No tracheal deviation present. No thyromegaly present.  Cardiovascular: Normal rate and regular rhythm.  Pulmonary/Chest: Effort normal and breath sounds normal.  Abdominal: Soft. Bowel sounds are normal. There is no tenderness. There is no guarding.  Musculoskeletal: Normal range of motion. She exhibits no edema.  Neurological: She is alert and oriented to person, place, and time. She has normal reflexes. No cranial nerve deficit. Coordination normal.  Skin: Skin is warm and dry. No rash noted. No erythema. No pallor.  Psychiatric: She has a normal mood and affect. Judgment normal.    CMP     Component Value Date/Time   NA 141 08/05/2017 0807   NA 140 06/23/2016 1550   K 3.8 08/05/2017 0807   CL 104 08/05/2017 0807   CO2 27 08/05/2017 0807   GLUCOSE 139 (H) 08/05/2017 0807   BUN 20 08/05/2017 0807   BUN 21 06/23/2016 1550   CREATININE 1.24 (H) 08/05/2017 0807   CALCIUM 9.3 08/05/2017 0807   PROT 6.4 08/05/2017 0807   ALBUMIN 4.4 01/25/2017 0922   AST 16 08/05/2017 0807   ALT 27 08/05/2017 0807   ALKPHOS 62 01/25/2017 0922   BILITOT 0.3 08/05/2017 0807   GFRNONAA 43 (L) 08/05/2017 0807   GFRAA 50 (L) 08/05/2017 0807     Diabetic Labs (most recent): Lab Results  Component Value Date   HGBA1C 6.6 (H) 08/05/2017   HGBA1C 6.9 (H) 04/30/2017   HGBA1C 6.9 (H) 01/25/2017     Lipid Panel ( most recent) Lipid Panel     Component Value Date/Time   CHOL 166 08/05/2017 0807   TRIG 160 (H) 08/05/2017 0807   HDL 43 (L) 08/05/2017 0807   CHOLHDL 3.9 08/05/2017 0807   VLDL NOT CALC 04/14/2016 0910   LDLCALC NOT CALC 04/14/2016 0910      Assessment & Plan:   1. DM type 2 causing vascular disease ([place]) - Patient has currently  uncontrolled symptomatic type 2 DM since  72 years of age. - She came with stable and controlled glycemic profile, A1c improving to 6.6%.  - I have reviewed her most recent labs, showing stage 3 renal insufficiency. -  Her diabetes is complicated by CKD,  coronary artery disease which is recurrent requiring stent placement , CVA, and patient remains at a high risk for more acute and chronic complications of diabetes which include CAD, CVA, CKD, retinopathy, and neuropathy. These are all discussed in detail with the patient.  - I have counseled the patient on diet management and weight loss, by adopting a carbohydrate restricted/protein rich diet.  -  Suggestion is made for her to avoid simple carbohydrates  from her diet including Cakes, Sweet Desserts / Pastries, Ice Cream, Soda (diet and regular), Sweet Tea, Candies, Chips, Cookies, Store Bought Juices, Alcohol in Excess of  1-2 drinks a day, Artificial Sweeteners, and "Sugar-free" Products. This will help patient to have stable blood glucose profile and potentially avoid unintended weight gain.   - I encouraged the patient to switch to  unprocessed or minimally processed complex starch and increased protein intake (animal or plant source), fruits, and vegetables.  - Patient is advised to stick to a routine mealtimes to eat 3 meals  a day and avoid unnecessary snacks ( to snack only to correct hypoglycemia).   - I have approached patient with the following individualized plan to manage diabetes and patient agrees:   - Due to cost reasons , and because of the fact that she is responding appropriately, I will keep her on  Novolin 70/30. - I will continue Novolin 70/30  25  units with breakfast and 25 units with supper when pre-meal blood glucose is above 90 mg/dL. - She is advised to continue strict monitoring of glucose   2 times a day-before breakfast and at bedtime and as needed.  - If her blood glucose profile is significantly abnormal, she will  be considered for insulin analogs. - Patient is warned not to take insulin without proper monitoring per orders. -Patient is encouraged to call clinic for blood glucose levels less than 70 or above 300 mg /dl. - I will continue metformin  500 mg by mouth twice a day  ,  still therapeutically suitable for patient.  - Patient specific target  A1c;  LDL, HDL, Triglycerides, and  Waist Circumference were discussed in detail.  2) BP/HTN: Her blood pressure is controlled to target.  She is advised to continue diltiazem, hydralazine, losartan at current doses.    3) Lipids/HPL:  Uncontrolled but significantly improving.  Her triglycerides have improved from 439-160, LDL still high at 96.   I advised her to continue statins and  fish oil  2 capsules twice a day.   4)  Weight/Diet: CDE Consult in progress, exercise, and detailed carbohydrates information provided.   5) hypothyroidism: Long-standing for at least 20 years.  - Her thyroid function tests are such that her TSH above target, however her free T4 is on target.   - I advised her to continue levothyroxine 125 g by mouth every morning.   - We discussed about correct intake of levothyroxine, at fasting, with water, separated by at least 30 minutes from breakfast, and separated by more than 4 hours from calcium, iron, multivitamins, acid reflux medications (PPIs). -Patient is made aware of the fact that thyroid hormone replacement is needed for life, dose to be adjusted by periodic monitoring of thyroid function tests.  6) Chronic Care/Health Maintenance:  -Patient is on ACEI/ARB and Statin medications and encouraged to continue to follow up with Ophthalmology, Podiatrist at least yearly or according to recommendations, and advised to  stay away from smoking. I have recommended  yearly flu vaccine and pneumonia vaccination at least every 5 years; moderate intensity exercise for up to 150 minutes weekly; and  sleep for at least 7 hours a day.  -  I advised patient to maintain close follow up with Sharilyn Sites, MD for primary care needs.  - Time spent with the patient: 25 min, of which >50% was spent in reviewing her blood glucose logs , discussing her hypo- and hyper-glycemic episodes, reviewing her current and  previous labs and insulin doses and developing a plan to avoid hypo- and hyper-glycemia. Please refer to Patient Instructions for Blood Glucose Monitoring and Insulin/Medications Dosing Guide"  in media tab for additional information.     Follow up plan: - Return in about 3 months (around 11/09/2017) for follow up with pre-visit labs, meter, and logs.  Glade Lloyd, MD Phone: [phone]  Fax: [phone]  -  This note was partially dictated with voice recognition software. Similar sounding words can be transcribed inadequately or may not  be corrected upon review.  08/12/2017, 9:17 AM

## 2017-08-24 DIAGNOSIS — E539 Vitamin B deficiency, unspecified: Secondary | ICD-10-CM | POA: Diagnosis not present

## 2017-08-25 ENCOUNTER — Encounter: Payer: Self-pay | Admitting: Cardiovascular Disease

## 2017-08-26 ENCOUNTER — Other Ambulatory Visit: Payer: Self-pay | Admitting: "Endocrinology

## 2017-08-27 ENCOUNTER — Other Ambulatory Visit: Payer: Self-pay

## 2017-08-27 MED ORDER — APIXABAN 5 MG PO TABS: ORAL_TABLET | ORAL | 3 refills | Status: DC

## 2017-08-27 NOTE — Telephone Encounter (Signed)
Refilled Eliquis to [facility] per pt e-mail request

## 2017-09-07 ENCOUNTER — Other Ambulatory Visit: Payer: Self-pay | Admitting: *Deleted

## 2017-09-07 MED ORDER — CLOPIDOGREL BISULFATE 75 MG PO TABS: ORAL_TABLET | ORAL | 3 refills | Status: DC

## 2017-09-08 DIAGNOSIS — H43811 Vitreous degeneration, right eye: Secondary | ICD-10-CM | POA: Diagnosis not present

## 2017-09-08 DIAGNOSIS — E113411 Type 2 diabetes mellitus with severe nonproliferative diabetic retinopathy with macular edema, right eye: Secondary | ICD-10-CM | POA: Diagnosis not present

## 2017-09-08 DIAGNOSIS — H34831 Tributary (branch) retinal vein occlusion, right eye, with macular edema: Secondary | ICD-10-CM | POA: Diagnosis not present

## 2017-09-16 DIAGNOSIS — S92414A Nondisplaced fracture of proximal phalanx of right great toe, initial encounter for closed fracture: Secondary | ICD-10-CM | POA: Diagnosis not present

## 2017-09-16 DIAGNOSIS — M79671 Pain in right foot: Secondary | ICD-10-CM | POA: Diagnosis not present

## 2017-09-21 ENCOUNTER — Other Ambulatory Visit (HOSPITAL_COMMUNITY): Payer: Self-pay | Admitting: Family Medicine

## 2017-09-21 ENCOUNTER — Ambulatory Visit (HOSPITAL_COMMUNITY)
Admission: RE | Admit: 2017-09-21 | Discharge: 2017-09-21 | Disposition: A | Discharge status: Home / Self Care | Payer: PPO | Source: Ambulatory Visit | Attending: Family Medicine | Admitting: Family Medicine

## 2017-09-21 DIAGNOSIS — Z0001 Encounter for general adult medical examination with abnormal findings: Secondary | ICD-10-CM | POA: Diagnosis not present

## 2017-09-21 DIAGNOSIS — E1165 Type 2 diabetes mellitus with hyperglycemia: Secondary | ICD-10-CM | POA: Diagnosis not present

## 2017-09-21 DIAGNOSIS — Z8673 Personal history of transient ischemic attack (TIA), and cerebral infarction without residual deficits: Secondary | ICD-10-CM | POA: Diagnosis not present

## 2017-09-21 DIAGNOSIS — Z7902 Long term (current) use of antithrombotics/antiplatelets: Secondary | ICD-10-CM | POA: Diagnosis not present

## 2017-09-21 DIAGNOSIS — J90 Pleural effusion, not elsewhere classified: Secondary | ICD-10-CM | POA: Insufficient documentation

## 2017-09-21 DIAGNOSIS — F410 Panic disorder [episodic paroxysmal anxiety] without agoraphobia: Secondary | ICD-10-CM | POA: Diagnosis not present

## 2017-09-21 DIAGNOSIS — I495 Sick sinus syndrome: Secondary | ICD-10-CM | POA: Diagnosis not present

## 2017-09-21 DIAGNOSIS — Z6830 Body mass index (BMI) 30.0-30.9, adult: Secondary | ICD-10-CM | POA: Diagnosis not present

## 2017-09-21 DIAGNOSIS — J9601 Acute respiratory failure with hypoxia: Secondary | ICD-10-CM | POA: Diagnosis not present

## 2017-09-21 DIAGNOSIS — I10 Essential (primary) hypertension: Secondary | ICD-10-CM | POA: Diagnosis not present

## 2017-09-21 DIAGNOSIS — R0902 Hypoxemia: Secondary | ICD-10-CM | POA: Diagnosis not present

## 2017-09-21 DIAGNOSIS — Z95811 Presence of heart assist device: Secondary | ICD-10-CM | POA: Insufficient documentation

## 2017-09-21 DIAGNOSIS — E1151 Type 2 diabetes mellitus with diabetic peripheral angiopathy without gangrene: Secondary | ICD-10-CM | POA: Diagnosis not present

## 2017-09-21 DIAGNOSIS — E663 Overweight: Secondary | ICD-10-CM | POA: Diagnosis not present

## 2017-09-21 DIAGNOSIS — J209 Acute bronchitis, unspecified: Secondary | ICD-10-CM | POA: Diagnosis not present

## 2017-09-21 DIAGNOSIS — R531 Weakness: Secondary | ICD-10-CM | POA: Diagnosis not present

## 2017-09-21 DIAGNOSIS — I5031 Acute diastolic (congestive) heart failure: Secondary | ICD-10-CM | POA: Diagnosis not present

## 2017-09-21 DIAGNOSIS — E7800 Pure hypercholesterolemia, unspecified: Secondary | ICD-10-CM | POA: Diagnosis not present

## 2017-09-21 DIAGNOSIS — R0600 Dyspnea, unspecified: Secondary | ICD-10-CM | POA: Diagnosis not present

## 2017-09-21 DIAGNOSIS — Z950 Presence of cardiac pacemaker: Secondary | ICD-10-CM | POA: Diagnosis not present

## 2017-09-21 DIAGNOSIS — R079 Chest pain, unspecified: Secondary | ICD-10-CM | POA: Diagnosis not present

## 2017-09-21 DIAGNOSIS — Z794 Long term (current) use of insulin: Secondary | ICD-10-CM | POA: Diagnosis not present

## 2017-09-21 DIAGNOSIS — I2510 Atherosclerotic heart disease of native coronary artery without angina pectoris: Secondary | ICD-10-CM | POA: Diagnosis not present

## 2017-09-21 DIAGNOSIS — Z955 Presence of coronary angioplasty implant and graft: Secondary | ICD-10-CM | POA: Diagnosis not present

## 2017-09-21 DIAGNOSIS — T380X5A Adverse effect of glucocorticoids and synthetic analogues, initial encounter: Secondary | ICD-10-CM | POA: Diagnosis not present

## 2017-09-21 DIAGNOSIS — J159 Unspecified bacterial pneumonia: Secondary | ICD-10-CM | POA: Diagnosis not present

## 2017-09-21 DIAGNOSIS — E785 Hyperlipidemia, unspecified: Secondary | ICD-10-CM | POA: Diagnosis not present

## 2017-09-21 DIAGNOSIS — E539 Vitamin B deficiency, unspecified: Secondary | ICD-10-CM | POA: Diagnosis not present

## 2017-09-21 DIAGNOSIS — N183 Chronic kidney disease, stage 3 (moderate): Secondary | ICD-10-CM | POA: Diagnosis not present

## 2017-09-21 DIAGNOSIS — I130 Hypertensive heart and chronic kidney disease with heart failure and stage 1 through stage 4 chronic kidney disease, or unspecified chronic kidney disease: Secondary | ICD-10-CM | POA: Diagnosis not present

## 2017-09-21 DIAGNOSIS — Z7901 Long term (current) use of anticoagulants: Secondary | ICD-10-CM | POA: Diagnosis not present

## 2017-09-21 DIAGNOSIS — G4733 Obstructive sleep apnea (adult) (pediatric): Secondary | ICD-10-CM | POA: Diagnosis not present

## 2017-09-21 DIAGNOSIS — I5033 Acute on chronic diastolic (congestive) heart failure: Secondary | ICD-10-CM | POA: Diagnosis not present

## 2017-09-21 DIAGNOSIS — R0602 Shortness of breath: Secondary | ICD-10-CM

## 2017-09-21 DIAGNOSIS — E038 Other specified hypothyroidism: Secondary | ICD-10-CM | POA: Diagnosis not present

## 2017-09-21 DIAGNOSIS — K219 Gastro-esophageal reflux disease without esophagitis: Secondary | ICD-10-CM | POA: Diagnosis not present

## 2017-09-21 DIAGNOSIS — E039 Hypothyroidism, unspecified: Secondary | ICD-10-CM | POA: Diagnosis not present

## 2017-09-21 DIAGNOSIS — E1122 Type 2 diabetes mellitus with diabetic chronic kidney disease: Secondary | ICD-10-CM | POA: Diagnosis not present

## 2017-09-21 DIAGNOSIS — I480 Paroxysmal atrial fibrillation: Secondary | ICD-10-CM | POA: Diagnosis not present

## 2017-09-21 DIAGNOSIS — Z1389 Encounter for screening for other disorder: Secondary | ICD-10-CM | POA: Diagnosis not present

## 2017-09-21 DIAGNOSIS — E1159 Type 2 diabetes mellitus with other circulatory complications: Secondary | ICD-10-CM | POA: Diagnosis not present

## 2017-09-21 DIAGNOSIS — Z79899 Other long term (current) drug therapy: Secondary | ICD-10-CM | POA: Diagnosis not present

## 2017-09-21 DIAGNOSIS — I252 Old myocardial infarction: Secondary | ICD-10-CM | POA: Diagnosis not present

## 2017-09-23 ENCOUNTER — Telehealth: Payer: Self-pay | Admitting: Cardiovascular Disease

## 2017-09-23 ENCOUNTER — Emergency Department (HOSPITAL_COMMUNITY): Payer: PPO

## 2017-09-23 ENCOUNTER — Inpatient Hospital Stay (HOSPITAL_COMMUNITY)
Admission: EM | Admit: 2017-09-23 | Discharge: 2017-09-30 | DRG: 291 | Disposition: A | Discharge status: Home Health | Payer: PPO | Attending: Internal Medicine | Admitting: Internal Medicine

## 2017-09-23 ENCOUNTER — Ambulatory Visit (INDEPENDENT_AMBULATORY_CARE_PROVIDER_SITE_OTHER): Payer: PPO | Admitting: *Deleted

## 2017-09-23 ENCOUNTER — Other Ambulatory Visit: Payer: Self-pay

## 2017-09-23 ENCOUNTER — Encounter (HOSPITAL_COMMUNITY): Payer: Self-pay | Admitting: Emergency Medicine

## 2017-09-23 DIAGNOSIS — Z955 Presence of coronary angioplasty implant and graft: Secondary | ICD-10-CM

## 2017-09-23 DIAGNOSIS — E1122 Type 2 diabetes mellitus with diabetic chronic kidney disease: Secondary | ICD-10-CM | POA: Diagnosis present

## 2017-09-23 DIAGNOSIS — Z794 Long term (current) use of insulin: Secondary | ICD-10-CM

## 2017-09-23 DIAGNOSIS — I5031 Acute diastolic (congestive) heart failure: Secondary | ICD-10-CM | POA: Diagnosis not present

## 2017-09-23 DIAGNOSIS — Z79899 Other long term (current) drug therapy: Secondary | ICD-10-CM

## 2017-09-23 DIAGNOSIS — E1121 Type 2 diabetes mellitus with diabetic nephropathy: Secondary | ICD-10-CM | POA: Diagnosis present

## 2017-09-23 DIAGNOSIS — I5033 Acute on chronic diastolic (congestive) heart failure: Secondary | ICD-10-CM | POA: Diagnosis present

## 2017-09-23 DIAGNOSIS — I2510 Atherosclerotic heart disease of native coronary artery without angina pectoris: Secondary | ICD-10-CM | POA: Diagnosis present

## 2017-09-23 DIAGNOSIS — N1832 Chronic kidney disease, stage 3b: Secondary | ICD-10-CM

## 2017-09-23 DIAGNOSIS — N183 Chronic kidney disease, stage 3 (moderate): Secondary | ICD-10-CM | POA: Diagnosis present

## 2017-09-23 DIAGNOSIS — I130 Hypertensive heart and chronic kidney disease with heart failure and stage 1 through stage 4 chronic kidney disease, or unspecified chronic kidney disease: Principal | ICD-10-CM | POA: Diagnosis present

## 2017-09-23 DIAGNOSIS — Z950 Presence of cardiac pacemaker: Secondary | ICD-10-CM

## 2017-09-23 DIAGNOSIS — E1151 Type 2 diabetes mellitus with diabetic peripheral angiopathy without gangrene: Secondary | ICD-10-CM | POA: Diagnosis present

## 2017-09-23 DIAGNOSIS — E1165 Type 2 diabetes mellitus with hyperglycemia: Secondary | ICD-10-CM | POA: Diagnosis present

## 2017-09-23 DIAGNOSIS — E038 Other specified hypothyroidism: Secondary | ICD-10-CM | POA: Diagnosis not present

## 2017-09-23 DIAGNOSIS — I480 Paroxysmal atrial fibrillation: Secondary | ICD-10-CM | POA: Diagnosis present

## 2017-09-23 DIAGNOSIS — Z8673 Personal history of transient ischemic attack (TIA), and cerebral infarction without residual deficits: Secondary | ICD-10-CM | POA: Diagnosis not present

## 2017-09-23 DIAGNOSIS — R0602 Shortness of breath: Secondary | ICD-10-CM | POA: Diagnosis present

## 2017-09-23 DIAGNOSIS — T380X5A Adverse effect of glucocorticoids and synthetic analogues, initial encounter: Secondary | ICD-10-CM | POA: Diagnosis present

## 2017-09-23 DIAGNOSIS — E039 Hypothyroidism, unspecified: Secondary | ICD-10-CM | POA: Diagnosis present

## 2017-09-23 DIAGNOSIS — J9601 Acute respiratory failure with hypoxia: Secondary | ICD-10-CM | POA: Diagnosis present

## 2017-09-23 DIAGNOSIS — Z7902 Long term (current) use of antithrombotics/antiplatelets: Secondary | ICD-10-CM | POA: Diagnosis not present

## 2017-09-23 DIAGNOSIS — I252 Old myocardial infarction: Secondary | ICD-10-CM

## 2017-09-23 DIAGNOSIS — J209 Acute bronchitis, unspecified: Secondary | ICD-10-CM | POA: Diagnosis present

## 2017-09-23 DIAGNOSIS — Z7901 Long term (current) use of anticoagulants: Secondary | ICD-10-CM | POA: Diagnosis not present

## 2017-09-23 DIAGNOSIS — E7800 Pure hypercholesterolemia, unspecified: Secondary | ICD-10-CM | POA: Diagnosis present

## 2017-09-23 DIAGNOSIS — R509 Fever, unspecified: Secondary | ICD-10-CM | POA: Diagnosis present

## 2017-09-23 DIAGNOSIS — E1159 Type 2 diabetes mellitus with other circulatory complications: Secondary | ICD-10-CM | POA: Diagnosis not present

## 2017-09-23 DIAGNOSIS — R0600 Dyspnea, unspecified: Secondary | ICD-10-CM | POA: Diagnosis present

## 2017-09-23 DIAGNOSIS — I10 Essential (primary) hypertension: Secondary | ICD-10-CM | POA: Diagnosis not present

## 2017-09-23 DIAGNOSIS — F410 Panic disorder [episodic paroxysmal anxiety] without agoraphobia: Secondary | ICD-10-CM | POA: Diagnosis not present

## 2017-09-23 DIAGNOSIS — I495 Sick sinus syndrome: Secondary | ICD-10-CM

## 2017-09-23 DIAGNOSIS — K219 Gastro-esophageal reflux disease without esophagitis: Secondary | ICD-10-CM | POA: Diagnosis present

## 2017-09-23 DIAGNOSIS — R0902 Hypoxemia: Secondary | ICD-10-CM

## 2017-09-23 DIAGNOSIS — J9801 Acute bronchospasm: Secondary | ICD-10-CM

## 2017-09-23 HISTORY — DX: Chronic kidney disease, stage 3b: N18.32

## 2017-09-23 LAB — CBC WITH DIFFERENTIAL/PLATELET
BASOS ABS: 0 10*3/uL (ref 0.0–0.1)
BASOS PCT: 0 %
EOS ABS: 0 10*3/uL (ref 0.0–0.7)
Eosinophils Relative: 0 %
HCT: 29.9 % — ABNORMAL LOW (ref 36.0–46.0)
Hemoglobin: 9.7 g/dL — ABNORMAL LOW (ref 12.0–15.0)
LYMPHS ABS: 0.3 10*3/uL — AB (ref 0.7–4.0)
Lymphocytes Relative: 2 %
MCH: 29.9 pg (ref 26.0–34.0)
MCHC: 32.4 g/dL (ref 30.0–36.0)
MCV: 92.3 fL (ref 78.0–100.0)
Monocytes Absolute: 1.4 10*3/uL — ABNORMAL HIGH (ref 0.1–1.0)
Monocytes Relative: 9 %
NEUTROS PCT: 89 %
Neutro Abs: 14.1 10*3/uL — ABNORMAL HIGH (ref 1.7–7.7)
PLATELETS: 309 10*3/uL (ref 150–400)
RBC: 3.24 MIL/uL — AB (ref 3.87–5.11)
RDW: 15.7 % — ABNORMAL HIGH (ref 11.5–15.5)
WBC: 15.8 10*3/uL — AB (ref 4.0–10.5)

## 2017-09-23 LAB — COMPREHENSIVE METABOLIC PANEL
ALT: 65 U/L — ABNORMAL HIGH (ref 14–54)
ANION GAP: 14 (ref 5–15)
AST: 35 U/L (ref 15–41)
Albumin: 3.3 g/dL — ABNORMAL LOW (ref 3.5–5.0)
Alkaline Phosphatase: 64 U/L (ref 38–126)
BUN: 26 mg/dL — ABNORMAL HIGH (ref 6–20)
CALCIUM: 9.1 mg/dL (ref 8.9–10.3)
CO2: 20 mmol/L — ABNORMAL LOW (ref 22–32)
Chloride: 101 mmol/L (ref 101–111)
Creatinine, Ser: 1.3 mg/dL — ABNORMAL HIGH (ref 0.44–1.00)
GFR, EST AFRICAN AMERICAN: 46 mL/min — AB (ref >60)
GFR, EST NON AFRICAN AMERICAN: 40 mL/min — AB (ref >60)
Glucose, Bld: 174 mg/dL — ABNORMAL HIGH (ref 65–99)
Potassium: 3.4 mmol/L — ABNORMAL LOW (ref 3.5–5.1)
Sodium: 135 mmol/L (ref 135–145)
TOTAL PROTEIN: 6.7 g/dL (ref 6.5–8.1)
Total Bilirubin: 1.6 mg/dL — ABNORMAL HIGH (ref 0.3–1.2)

## 2017-09-23 LAB — CG4 I-STAT (LACTIC ACID): LACTIC ACID, VENOUS: 0.82 mmol/L (ref 0.5–1.9)

## 2017-09-23 LAB — INFLUENZA PANEL BY PCR (TYPE A & B)
INFLBPCR: NEGATIVE
Influenza A By PCR: NEGATIVE

## 2017-09-23 LAB — BRAIN NATRIURETIC PEPTIDE: B NATRIURETIC PEPTIDE 5: 841 pg/mL — AB (ref 0.0–100.0)

## 2017-09-23 LAB — I-STAT TROPONIN, ED: Troponin i, poc: 0.01 ng/mL (ref 0.00–0.08)

## 2017-09-23 LAB — TSH: TSH: 11.574 u[IU]/mL — AB (ref 0.350–4.500)

## 2017-09-23 LAB — I-STAT CG4 LACTIC ACID, ED: Lactic Acid, Venous: 0.95 mmol/L (ref 0.5–1.9)

## 2017-09-23 MED ORDER — LEVOFLOXACIN IN D5W 750 MG/150ML IV SOLN
750.0000 mg | Freq: Once | INTRAVENOUS | Status: AC
  Administered 2017-09-23: 750 mg via INTRAVENOUS
  Filled 2017-09-23: qty 150

## 2017-09-23 MED ORDER — SODIUM CHLORIDE 0.9 % IV SOLN: 250.0000 mL | INTRAVENOUS | Status: DC | PRN

## 2017-09-23 MED ORDER — ONDANSETRON HCL 4 MG/2ML IJ SOLN
4.0000 mg | Freq: Once | INTRAMUSCULAR | Status: AC
  Administered 2017-09-23: 4 mg via INTRAVENOUS
  Filled 2017-09-23: qty 2

## 2017-09-23 MED ORDER — ACETAMINOPHEN 325 MG PO TABS: 650.0000 mg | ORAL_TABLET | ORAL | Status: DC | PRN

## 2017-09-23 MED ORDER — INSULIN ASPART 100 UNIT/ML ~~LOC~~ SOLN
0.0000 [IU] | Freq: Three times a day (TID) | SUBCUTANEOUS | Status: DC
  Administered 2017-09-24 (×2): 1 [IU] via SUBCUTANEOUS
  Administered 2017-09-24: 2 [IU] via SUBCUTANEOUS
  Administered 2017-09-25: 9 [IU] via SUBCUTANEOUS
  Administered 2017-09-25: 7 [IU] via SUBCUTANEOUS
  Administered 2017-09-25 – 2017-09-26 (×2): 5 [IU] via SUBCUTANEOUS

## 2017-09-23 MED ORDER — AMIODARONE HCL 200 MG PO TABS
200.0000 mg | ORAL_TABLET | Freq: Every day | ORAL | Status: DC
  Administered 2017-09-24 – 2017-09-30 (×7): 200 mg via ORAL
  Filled 2017-09-23 (×7): qty 1

## 2017-09-23 MED ORDER — LOSARTAN POTASSIUM 50 MG PO TABS
100.0000 mg | ORAL_TABLET | Freq: Every day | ORAL | Status: DC
  Administered 2017-09-24: 100 mg via ORAL
  Filled 2017-09-23: qty 2

## 2017-09-23 MED ORDER — HYDRALAZINE HCL 25 MG PO TABS
100.0000 mg | ORAL_TABLET | Freq: Three times a day (TID) | ORAL | Status: DC
  Administered 2017-09-23 – 2017-09-24 (×2): 100 mg via ORAL
  Filled 2017-09-23 (×3): qty 4

## 2017-09-23 MED ORDER — CLOPIDOGREL BISULFATE 75 MG PO TABS
75.0000 mg | ORAL_TABLET | Freq: Every day | ORAL | Status: DC
  Administered 2017-09-24 – 2017-09-30 (×7): 75 mg via ORAL
  Filled 2017-09-23 (×7): qty 1

## 2017-09-23 MED ORDER — FUROSEMIDE 10 MG/ML IJ SOLN
40.0000 mg | Freq: Two times a day (BID) | INTRAMUSCULAR | Status: DC
  Administered 2017-09-24 – 2017-09-25 (×3): 40 mg via INTRAVENOUS
  Filled 2017-09-23 (×3): qty 4

## 2017-09-23 MED ORDER — CARVEDILOL 12.5 MG PO TABS
25.0000 mg | ORAL_TABLET | Freq: Two times a day (BID) | ORAL | Status: DC
  Administered 2017-09-23 – 2017-09-30 (×14): 25 mg via ORAL
  Filled 2017-09-23 (×14): qty 2

## 2017-09-23 MED ORDER — SODIUM CHLORIDE 0.9% FLUSH
3.0000 mL | Freq: Two times a day (BID) | INTRAVENOUS | Status: DC
  Administered 2017-09-23 – 2017-09-30 (×13): 3 mL via INTRAVENOUS

## 2017-09-23 MED ORDER — ONDANSETRON HCL 4 MG/2ML IJ SOLN
4.0000 mg | Freq: Four times a day (QID) | INTRAMUSCULAR | Status: DC | PRN
  Administered 2017-09-24 – 2017-09-25 (×2): 4 mg via INTRAVENOUS
  Filled 2017-09-23 (×2): qty 2

## 2017-09-23 MED ORDER — DILTIAZEM HCL ER COATED BEADS 180 MG PO CP24
360.0000 mg | ORAL_CAPSULE | Freq: Every day | ORAL | Status: DC
  Administered 2017-09-24 – 2017-09-30 (×7): 360 mg via ORAL
  Filled 2017-09-23 (×7): qty 2

## 2017-09-23 MED ORDER — INSULIN ASPART PROT & ASPART (70-30 MIX) 100 UNIT/ML ~~LOC~~ SUSP
25.0000 [IU] | Freq: Two times a day (BID) | SUBCUTANEOUS | Status: DC
  Administered 2017-09-24 – 2017-09-26 (×5): 25 [IU] via SUBCUTANEOUS
  Filled 2017-09-23: qty 10

## 2017-09-23 MED ORDER — IPRATROPIUM-ALBUTEROL 0.5-2.5 (3) MG/3ML IN SOLN
3.0000 mL | Freq: Once | RESPIRATORY_TRACT | Status: AC
Start: 2017-09-23 — End: 2017-09-23
  Administered 2017-09-23: 3 mL via RESPIRATORY_TRACT
  Filled 2017-09-23: qty 3

## 2017-09-23 MED ORDER — FUROSEMIDE 10 MG/ML IJ SOLN
60.0000 mg | Freq: Once | INTRAMUSCULAR | Status: AC
  Administered 2017-09-23: 60 mg via INTRAVENOUS
  Filled 2017-09-23: qty 6

## 2017-09-23 MED ORDER — ATORVASTATIN CALCIUM 40 MG PO TABS
80.0000 mg | ORAL_TABLET | Freq: Every day | ORAL | Status: DC
  Administered 2017-09-24 – 2017-09-29 (×6): 80 mg via ORAL
  Filled 2017-09-23 (×6): qty 2

## 2017-09-23 MED ORDER — SODIUM CHLORIDE 0.9% FLUSH: 3.0000 mL | INTRAVENOUS | Status: DC | PRN

## 2017-09-23 MED ORDER — POTASSIUM CHLORIDE CRYS ER 20 MEQ PO TBCR
20.0000 meq | EXTENDED_RELEASE_TABLET | Freq: Every day | ORAL | Status: DC
  Administered 2017-09-24 – 2017-09-30 (×7): 20 meq via ORAL
  Filled 2017-09-23 (×7): qty 1

## 2017-09-23 MED ORDER — ROPINIROLE HCL 0.25 MG PO TABS
0.5000 mg | ORAL_TABLET | Freq: Every day | ORAL | Status: DC
  Administered 2017-09-23 – 2017-09-29 (×7): 0.5 mg via ORAL
  Filled 2017-09-23 (×7): qty 2

## 2017-09-23 MED ORDER — ASPIRIN EC 81 MG PO TBEC
81.0000 mg | DELAYED_RELEASE_TABLET | Freq: Every day | ORAL | Status: DC
  Administered 2017-09-26: 81 mg via ORAL
  Filled 2017-09-23 (×4): qty 1

## 2017-09-23 MED ORDER — ACETAMINOPHEN 500 MG PO TABS
1000.0000 mg | ORAL_TABLET | Freq: Once | ORAL | Status: AC
  Administered 2017-09-23: 1000 mg via ORAL
  Filled 2017-09-23: qty 2

## 2017-09-23 MED ORDER — ALBUTEROL SULFATE (2.5 MG/3ML) 0.083% IN NEBU
2.5000 mg | INHALATION_SOLUTION | RESPIRATORY_TRACT | Status: DC | PRN
  Administered 2017-09-24: 2.5 mg via RESPIRATORY_TRACT
  Filled 2017-09-23: qty 3

## 2017-09-23 MED ORDER — APIXABAN 5 MG PO TABS
5.0000 mg | ORAL_TABLET | Freq: Two times a day (BID) | ORAL | Status: DC
  Administered 2017-09-23 – 2017-09-30 (×14): 5 mg via ORAL
  Filled 2017-09-23 (×14): qty 1

## 2017-09-23 MED ORDER — LEVOFLOXACIN IN D5W 750 MG/150ML IV SOLN
750.0000 mg | INTRAVENOUS | Status: DC
  Administered 2017-09-25: 750 mg via INTRAVENOUS
  Filled 2017-09-23 (×2): qty 150

## 2017-09-23 MED ORDER — LEVOTHYROXINE SODIUM 75 MCG PO TABS
175.0000 ug | ORAL_TABLET | Freq: Every day | ORAL | Status: DC
  Administered 2017-09-24: 09:00:00 175 ug via ORAL
  Filled 2017-09-23: qty 1

## 2017-09-23 NOTE — Telephone Encounter (Signed)
Patient does not have swollen feet, notes SOB.Saw pcp and had cxr, showed small left sided pleural effusion.PCP made no changes to medications.States she broke her foot and is wearing a boot.Does not weight self. Her biggest complaint is her SOB is limiting her lifestyle.

## 2017-09-23 NOTE — ED Notes (Signed)
Report to  [employer]

## 2017-09-23 NOTE — H&P (Signed)
History and Physical    [name]:[id number] DOB: January 12, 1946 DOA: 09/23/2017  PCP: Sharilyn Sites, MD Consultants:  Bronson Ing - cardiology; [place]; Dorris Fetch - endocrinology; Glo Herring - GYN; [place] - eye Patient coming from:  Home - lives with husband; NOK: husband, [phone]  Chief Complaint: SOB  HPI: Virginia Huber is a 72 y.o. female with medical history significant of DM, afib, PAD, CAD, hypothyroidism, HLD, and HTN presenting with fever and SOB.  She went to Dr. [name] Tuesday for a checkup and noticed SOB - he thought it was a URI and he gave her a steroid injection and a Z-pack and a breathing treatment.  It has continued to progress since and today she couldn't really breathe at all.  SOB is constant this week, not related to exertion.  Cough productive of "crud" - gray-looking.  Fever to 101.4 tonight, this was the first.  She has 2 more doses of azithromycin left to complete the course.  She was unable to lie flat at home.  She does feel better with the O2.  +LE edema.  She has not been sleeping because she couldn't breathe.  Mild chest discomfort that she attributes to being so short of breath.   ED Course:  SOB, fevers, hypoxia.  +LE edema but also flu-like symptoms.  She called cardiology - doubled Lasix to 80 mg today but ongoing symptoms.  On 2L O2 now.  CXR with edema.  Given antibiotics given productive cough.  Neb helped.  Review of Systems: As per HPI; otherwise review of systems reviewed and negative.   Ambulatory Status:  Ambulates without assistance, wearing a CAM walker  Past Medical History:  Diagnosis Date  . Arthritis   . Atrial fibrillation and flutter ([place])    a. h/o PAF/flutter during admission in 2013 for PNA. b. PAF during adm for NSTEMI 07/2015, subsequent paroxysms since then.  . B12 deficiency anemia   . Cardiac tamponade 06/2016  . Coronary artery disease 11/30/2014   a. remote MI. b. h/o PTCA with scoring balloon to OM1 11/2014. c. NSTEMI  03/2015 s/p DES to prox-mid Cx. d. NSTEMI 07/2015 s/p scoring balloon/PTCA/DES to dRCA with PAF during that admission  . Cutaneous lupus erythematosus   . GERD (gastroesophageal reflux disease)   . Heart block   . History of blood transfusion 1980's   2nd surgical procedures  . HTN (hypertension)   . Hypercholesteremia   . Hypothyroidism   . Myocardial infarction ([place]) 02/2012  . Ovarian tumor   . PAD (peripheral artery disease) ([place])    a. s/p LE angio 2015; followed by Dr. Fletcher Anon - managed medically.  . Pain with urination 05/08/2015  . Paroxysmal atrial flutter ([place])   . Pericardial effusion    a. 06/2016 after ppm - s/p pericardiocentesis.  . Superficial fungus infection of skin 06/29/2013  . Tachy-brady syndrome ([place])    a. s/p Medtronic PPM 06/2016, c/b lead perf/pericardial effusion.  [name] TIA (transient ischemic attack) 08/2001; ~ 2006  . Type II diabetes mellitus ([place])   . UTI (urinary tract infection) 05/08/2013    Past Surgical History:  Procedure Laterality Date  . ABDOMINAL AORTAGRAM N/A 01/03/2014   Procedure: ABDOMINAL [name];  Surgeon: Wellington Hampshire, MD;  Location: [facility]ATH [facility];  Service: Cardiovascular;  Laterality: N/A;  . ABDOMINAL HYSTERECTOMY  1972   "partial"  . APPENDECTOMY  1970's  . CARDIAC CATHETERIZATION  2008   Tiny OM-2 with 90% narrowing. Med tx.  [name] CARDIAC  CATHETERIZATION N/A 11/30/2014   Procedure: Left Heart Cath and Coronary Angiography;  Surgeon: Troy Sine, MD; LAD 20%, CFX 50%, OM1 95%, right PLB 30%, LV normal   . CARDIAC CATHETERIZATION N/A 11/30/2014   Procedure: Coronary Balloon Angioplasty;  Surgeon: Troy Sine, MD;  Angiosculpt scoring balloon and PTCA to the OM1 reducing stenosis from 95% to less than 10%  . CARDIAC CATHETERIZATION N/A 04/03/2015   Procedure: Left Heart Cath and Coronary Angiography;  Surgeon: Jolaine Artist, MD; dLAD 50%, CFX 90%, OM1 100%, PLA 15%, LVEDP 13    . CARDIAC CATHETERIZATION N/A 04/03/2015    Procedure: Coronary Stent Intervention;  Surgeon: Sherren Mocha, MD; 3.0x18 mm Xience DES to the CFX    . CARDIAC CATHETERIZATION N/A 08/02/2015   Procedure: Left Heart Cath and Coronary Angiography;  Surgeon: Troy Sine, MD;  Location: [name];  Service: Cardiovascular;  Laterality: N/A;  . CARDIAC CATHETERIZATION N/A 08/02/2015   Procedure: Coronary Stent Intervention;  Surgeon: Troy Sine, MD;  Location: [facility];  Service: Cardiovascular;  Laterality: N/A;  . CARDIAC CATHETERIZATION N/A 06/25/2016   Procedure: Pericardiocentesis;  Surgeon: Will Meredith Leeds, MD;  Location: [facility];  Service: Cardiovascular;  Laterality: N/A;  . cardiac stents    . CHOLECYSTECTOMY OPEN  1990's  . COLONOSCOPY  2005   Dr. Laural Golden: pancolonic divericula, polyp, path unknown currently  . COLONOSCOPY  2012   Dr. Oneida Alar: Normal TI, scattered diverticula in entire colon, small internal hemorrhoids, normal colon biopsies. Colonoscopy in 5-10 years.   . COLOSTOMY  05/1979  . COLOSTOMY REVERSAL  11/1979  . EP IMPLANTABLE DEVICE N/A 06/25/2016   Procedure: Lead Revision/Repair;  Surgeon: Will Meredith Leeds, MD;  Location: [facility];  Service: Cardiovascular;  Laterality: N/A;  . EP IMPLANTABLE DEVICE N/A 06/25/2016   Procedure: Pacemaker Implant;  Surgeon: Will Meredith Leeds, MD;  Location: [facility];  Service: Cardiovascular;  Laterality: N/A;  . EXCISIONAL HEMORRHOIDECTOMY  1970's  . EYE SURGERY Left 2000   "branch vein occlusion"  . EYE SURGERY Left ~ 2001   "smoothed out wrinkle"  . LEFT OOPHORECTOMY  05/1979   nicked bowel, peritonitis, colostomy; colostomy reversed 1981   . LOWER EXTREMITY ANGIOGRAM N/A 01/03/2014   Procedure: LOWER EXTREMITY ANGIOGRAM;  Surgeon: Wellington Hampshire, MD;  Location: [facility];  Service: Cardiovascular;  Laterality: N/A;  . Nuclear med stress test  10/2011   Small area of mild ischemia inferoapically.  [name] PARTIAL HYSTERECTOMY   1970's   left ovaries, then ovaries removed later due tumors   . RIGHT OOPHORECTOMY  1970's    Social History   Socioeconomic History  . Marital status: Married    Spouse name: Not on file  . Number of children: Not on file  . Years of education: Not on file  . Highest education level: Not on file  Occupational History  . Occupation: Retired    Fish farm manager: RETIRED    Comment: Research officer, political party  Social Needs  . Financial resource strain: Not on file  . Food insecurity:    Worry: Not on file    Inability: Not on file  . Transportation needs:    Medical: Not on file    Non-medical: Not on file  Tobacco Use  . Smoking status: Never Smoker  . Smokeless tobacco: Never Used  . Tobacco comment: Never smoked  Substance and Sexual Activity  . Alcohol use: No  Alcohol/week: 0.0 oz  . Drug use: No  . Sexual activity: Never    Birth control/protection: Surgical    Comment: hyst  Lifestyle  . Physical activity:    Days per week: Not on file    Minutes per session: Not on file  . Stress: Not on file  Relationships  . Social connections:    Talks on phone: Not on file    Gets together: Not on file    Attends religious service: Not on file    Active member of club or organization: Not on file    Attends meetings of clubs or organizations: Not on file    Relationship status: Not on file  . Intimate partner violence:    Fear of current or ex partner: Not on file    Emotionally abused: Not on file    Physically abused: Not on file    Forced sexual activity: Not on file  Other Topics Concern  . Not on file  Social History Narrative  . Not on file    Allergies  Allergen Reactions  . Penicillins Hives    Has patient had a PCN reaction causing immediate rash, facial/tongue/throat swelling, SOB or lightheadedness with hypotension: Yes Has patient had a PCN reaction causing severe rash involving mucus membranes or skin necrosis: No Has patient had a PCN reaction that required  hospitalization No Has patient had a PCN reaction occurring within the last 10 years: No If all of the above answers are "NO", then may proceed with Cephalosporin use.  [name] Percocet [Oxycodone-Acetaminophen] Nausea And Vomiting    Family History  Problem Relation Age of Onset  . Heart disease Mother        deceased  . Heart disease Father        deceased, heart disease  . Diabetes Brother   . Heart disease Brother   . Thyroid disease Brother   . Heart disease Sister   . Heart disease Brother   . Thyroid disease Brother   . Lupus Daughter   . Colon cancer Neg Hx     Prior to Admission medications   Medication Sig Start Date End Date Taking? Authorizing Provider  acetaminophen (TYLENOL) 325 MG tablet Take 650 mg by mouth every 6 (six) hours as needed for headache.   Yes [provider]  albuterol (PROVENTIL HFA;VENTOLIN HFA) 108 (90 Base) MCG/ACT inhaler Inhale 2 puffs into the lungs every 6 (six) hours as needed for wheezing or shortness of breath. 07/29/17  Yes Herminio Commons, MD  amiodarone (PACERONE) 200 MG tablet Take 1 tablet (200 mg total) by mouth daily. 07/06/16  Yes Camnitz, Will Hassell Done, MD  apixaban (ELIQUIS) 5 MG TABS tablet TAKE (1) TABLET BY MOUTH TWICE DAILY. 08/27/17  Yes Herminio Commons, MD  atorvastatin (LIPITOR) 80 MG tablet Take 1 tablet (80 mg total) by mouth daily at 6 PM. 04/27/16  Yes Herminio Commons, MD  azithromycin (ZITHROMAX) 250 MG tablet Take 1 tablet by mouth daily. 09/21/17  Yes [provider]  carvedilol (COREG) 25 MG tablet Take 1 tablet (25 mg total) by mouth 2 (two) times daily. 07/15/17 10/13/17 Yes Camnitz, Ocie Doyne, MD  cholecalciferol (VITAMIN D) 1000 UNITS tablet Take 50,000 Units by mouth once a week.    Yes [provider]  clopidogrel (PLAVIX) 75 MG tablet TAKE ONE (1) TABLET BY MOUTH EVERY DAY 09/07/17  Yes Herminio Commons, MD  diltiazem (CARDIZEM CD) 360 MG 24 hr capsule Take 1 capsule (  360 mg total)  by mouth daily. 08/09/17  Yes Herminio Commons, MD  diphenhydramine-acetaminophen (TYLENOL PM EXTRA STRENGTH) 25-500 MG TABS tablet Take 1 tablet by mouth at bedtime as needed (Take as directed).    Yes [provider]  furosemide (LASIX) 40 MG tablet TAKE ONE TABLET BY MOUTH ONCE DAILY 10/23/16  Yes Herminio Commons, MD  glucose blood (FREESTYLE LITE) test strip USE AS DIRECTED TWICE DAILY. 05/05/17  Yes Nida, Marella Chimes, MD  hydrALAZINE (APRESOLINE) 50 MG tablet TAKE 2 TABLETS (100MG  TOTAL) BY MOUTH THREE TIMES DAILY 08/04/17  Yes Herminio Commons, MD  insulin NPH-regular Human (NOVOLIN 70/30) (70-30) 100 UNIT/ML injection Inject 25 Units into the skin See admin instructions. Inject 25 units before breakfast and 25 units before supper  - only if pre-meal blood glucose is above 90 mg/dL.   Yes [provider]  losartan (COZAAR) 100 MG tablet TAKE ONE (1) TABLET EACH DAY 06/17/17  Yes Herminio Commons, MD  metFORMIN (GLUCOPHAGE) 500 MG tablet TAKE ONE TABLET BY MOUTH TWICE A DAY WITH A MEAL 08/26/17  Yes Nida, Marella Chimes, MD  potassium chloride SA (K-DUR,KLOR-CON) 20 MEQ tablet TAKE ONE (1) TABLET BY MOUTH EVERY DAY 10/16/16  Yes Herminio Commons, MD  rOPINIRole (REQUIP) 0.5 MG tablet Take 0.5 mg by mouth at bedtime.  03/05/14  Yes [provider]  levothyroxine (SYNTHROID, LEVOTHROID) 175 MCG tablet Take 175 mcg by mouth daily. brfore breakfast 09/22/17   [provider]  nitroGLYCERIN (NITROSTAT) 0.4 MG SL tablet Place 1 tablet (0.4 mg total) under the tongue every 5 (five) minutes as needed for chest pain. 04/04/15   Lonn Georgia, PA-C    Physical Exam: Vitals:   09/23/17 1954 09/23/17 2100 09/23/17 2130 09/23/17 2156  BP:  (!) 141/56 (!) 139/57 (!) 139/57  Pulse:  64 61 65  Resp:  20 16 18   Temp:    98.6 F (37 C)  TempSrc:    Oral  SpO2: 94% 94% 95% 96%  Weight:         General:  Appears calm and comfortable and is  NAD Eyes:  PERRL, EOMI, normal lids, iris ENT:  grossly normal hearing, lips & tongue, mmm Neck:  no LAD, masses or thyromegaly Cardiovascular:  RRR, no m/r/g. 2+ LE edema.  Respiratory:   CTA bilaterally with no wheezes/rales/rhonchi.  Normal respiratory effort. Abdomen:  soft, NT, ND, NABS Back:   normal alignment, no CVAT Skin:  no rash or induration seen on limited exam Musculoskeletal:  grossly normal tone BUE/BLE, good ROM, no bony abnormality other than right foot in CAM walker Psychiatric:  grossly normal mood and affect, speech fluent and appropriate, AOx3 Neurologic:  CN 2-12 grossly intact, moves all extremities in coordinated fashion, sensation intact    Radiological Exams on Admission: Dg Chest 2 View  Result Date: 09/23/2017 CLINICAL DATA:  Chest pain short of breath EXAM: CHEST - 2 VIEW COMPARISON:  09/21/2017, 06/26/2016 FINDINGS: Left-sided pacing device as before. Small bilateral pleural effusions. Cardiomegaly with worsened vascular congestion and development of moderate interstitial opacity consistent with edema. Aortic atherosclerosis. No pneumothorax. IMPRESSION: 1. Cardiomegaly with development of vascular congestion and moderate diffuse interstitial opacity consistent with pulmonary edema. Small pleural effusions. Electronically Signed   By: Donavan Foil M.D.   On: 09/23/2017 20:49    EKG: Independently reviewed.  NSR with rate 69; prolonged QT 495;  nonspecific ST changes with no evidence of acute ischemia   Labs  on Admission: I have personally reviewed the available labs and imaging studies at the time of the admission.  Pertinent labs:   BNP 841 Troponin 0.02 Lactate 0.95 CO2 20 Glucose 174 BUN 26/Creatinine 1.30/GFR 46 - stable WBC 15.8 Hgb 9.7 Flu negative  Assessment/Plan Principal Problem:   Acute respiratory failure with hypoxia (HCC) Active Problems:   Essential hypertension   Hypothyroidism   DM type 2 causing vascular disease (HCC)    Acute diastolic CHF (congestive heart failure), NYHA class 3 (HCC)   CKD (chronic kidney disease) stage 3, GFR 30-59 ml/min (HCC)   Fever   Acute respiratory failure with hypoxia -Patient presenting with classic signs of CHF but also with fever and leukocytosis -Hypoxia into the 80s on presentation -Suspect that this is a mixed picture  Acute on chronic CHF exacerbation -Patient presenting with worsening SOB and hypoxia despite recent antibiotics -CXR consistent with pulmonary edema -Elevated BNP - currently 841, prior was 155 in 12/17 -Prior Echo in 2/19 with preserved EF but grade 2 diastolic dysfunction -With elevated BNP and abnl CXR, CHF exacerbation seems to be her primary diagnosis -Will admit with telemetry -Will not plan to repeat Echo at this time -Will continue Cozaar -Will continue Coreg -CHF order set utilized -Was given Lasix 60 mg x 1 in ER and will repeat with 40 mg IV BID -Continue [place] O2 for now -Stable kidney function at this time, will follow -Repeat EKG in AM -Will r/o with serial troponins although doubt ACS based on symptoms  Fever -Patient reports having been to her PCP recently with the onset of symptoms and told she had a cold; she reports being given a steroid injection and prescribed Z-pack -CXR is currently negative for PNA -She did have a fever in the ER -She was given Levaquin in the ER -Will check procalcitonin and viral respiratory panel; based on these results, it may be reasonable to discontinue antibiotics -She does not appear to meet sepsis criteria at this time  Afib on AC -Rate controlled on Amio, Dilt, and Coreg -On Eliquis  HTN -Continue Coreg, Dilt, Hydralazine, Cozaar  DM -Recent A1c was 6.6 -hold Glucophage -Continue 70/30 insulin -Cover with sensitive-scale SSI and hold qhs coverage given that patient is on 70/30  Hypothyroidism -Check TSH and free T4; when there were last checked in 2/19, the TSH was quite elevated with a  normal free T4 -Continue Synthroid at current dose for now  CKD -Appears to be stable at this time -Will follow during diuresis  DVT prophylaxis: Eliquis Code Status:  Full - confirmed with patient/family Family Communication: Husband present throughout evaluation  Disposition Plan:  Home once clinically improved Consults called: None  Admission status: Admit - It is my clinical opinion that admission to INPATIENT is reasonable and necessary because of the expectation that this patient will require hospital care that crosses at least 2 midnights to treat this condition based on the medical complexity of the problems presented.  Given the aforementioned information, the predictability of an adverse outcome is felt to be significant.    Karmen Bongo MD [facility]  If note is complete, please contact covering daytime or nighttime physician. [email or web address] Password [email or web address]  09/23/2017, 10:00 PM

## 2017-09-23 NOTE — ED Provider Notes (Signed)
Emergency Department Provider Note   I have reviewed the triage vital signs and the nursing notes.   HISTORY  Chief Complaint Shortness of Breath   HPI Virginia Huber is a 72 y.o. female with PMH of PAF, CAD, GERD, HTN, HLD, and DM to the emergency department for evaluation of shortness of breath with cough, fatigue, decreased appetite over the past 4 days.  The patient saw her primary care physician on Tuesday who gave a steroid shot in the office and started a Z-Pak and breathing treatment.  The patient has no prior history of wheezing.  She does not smoke.  She noticed her legs were becoming more swollen so called her cardiologist to advise she double her Lasix dose for the next 4 days which she began today with no relief.  She is developed fever and her cough has become productive.  She has some abdominal soreness which is worse with coughing.  Denies any diarrhea but states her stools have been somewhat loose. No vomiting. No CP or palpitations.    Past Medical History:  Diagnosis Date  . Arthritis   . Atrial fibrillation and flutter ([place])    a. h/o PAF/flutter during admission in 2013 for PNA. b. PAF during adm for NSTEMI 07/2015, subsequent paroxysms since then.  . B12 deficiency anemia   . Cardiac tamponade 06/2016  . Coronary artery disease 11/30/2014   a. remote MI. b. h/o PTCA with scoring balloon to OM1 11/2014. c. NSTEMI 03/2015 s/p DES to prox-mid Cx. d. NSTEMI 07/2015 s/p scoring balloon/PTCA/DES to dRCA with PAF during that admission  . Cutaneous lupus erythematosus   . GERD (gastroesophageal reflux disease)   . Heart block   . History of blood transfusion 1980's   2nd surgical procedures  . HTN (hypertension)   . Hypercholesteremia   . Hypothyroidism   . Myocardial infarction ([place]) 02/2012  . Ovarian tumor   . PAD (peripheral artery disease) ([place])    a. s/p LE angio 2015; followed by Dr. Fletcher Anon - managed medically.  . Pain with urination 05/08/2015  . Paroxysmal  atrial flutter ([place])   . Pericardial effusion    a. 06/2016 after ppm - s/p pericardiocentesis.  . Superficial fungus infection of skin 06/29/2013  . Tachy-brady syndrome ([place])    a. s/p Medtronic PPM 06/2016, c/b lead perf/pericardial effusion.  [name] TIA (transient ischemic attack) 08/2001; ~ 2006  . Type II diabetes mellitus ([place])   . UTI (urinary tract infection) 05/08/2013    Patient Active Problem List   Diagnosis Date Noted  . CKD (chronic kidney disease) stage 3, GFR 30-59 ml/min (HCC) 09/23/2017  . Fever 09/23/2017  . S/P pericardiocentesis 06/28/2016  . Acute blood loss anemia 06/28/2016  . Pericardial effusion 06/26/2016  . Tachy-brady syndrome ([place]) 06/25/2016  . Tamponade   . Bradycardia 06/14/2016  . Junctional bradycardia   . Coronary artery disease involving coronary bypass graft of native heart with unstable angina pectoris ([place])   . Acute diastolic CHF (congestive heart failure), NYHA class 3 ([place])   . Systolic congestive heart failure ([place]) 05/13/2016  . Multiple and bilateral precerebral artery syndromes 05/13/2016  . OSA (obstructive sleep apnea) 05/13/2016  . Chronic diarrhea 12/25/2015  . Chest pain 08/02/2015  . Atrial fibrillation with rapid ventricular response ([place])   . Pain with urination 05/08/2015  . NSTEMI (non-ST elevated myocardial infarction) ([place]) 04/02/2015  . CAD in native artery 11/30/2014  . Coronary artery disease involving native coronary artery with other  forms of angina pectoris   . PAD (peripheral artery disease) ([place]) 12/26/2013  . Superficial fungus infection of skin 06/29/2013  . UTI (urinary tract infection) 05/08/2013  . Hypokalemia 03/05/2012  . B12 deficiency anemia 03/02/2012  . Bronchospasm 03/02/2012  . Community acquired bacterial pneumonia 03/01/2012  . Acute respiratory failure with hypoxia ([place]) 03/01/2012  . DM type 2 causing vascular disease ([place]) 02/29/2012  . Hypothyroidism 02/28/2012  . RLQ abdominal pain 11/24/2010  .  OVERWEIGHT/OBESITY 06/03/2010  . Essential hypertension 06/03/2010  . Mixed hyperlipidemia 12/27/2009  . Palpitations 05/17/2009  . FRACTURE, TOE 12/06/2007    Past Surgical History:  Procedure Laterality Date  . ABDOMINAL AORTAGRAM N/A 01/03/2014   Procedure: ABDOMINAL [name];  Surgeon: Wellington Hampshire, MD;  Location: [facility];  Service: Cardiovascular;  Laterality: N/A;  . ABDOMINAL HYSTERECTOMY  1972   "partial"  . APPENDECTOMY  1970's  . CARDIAC CATHETERIZATION  2008   Tiny OM-2 with 90% narrowing. Med tx.  [name] CARDIAC CATHETERIZATION N/A 11/30/2014   Procedure: Left Heart Cath and Coronary Angiography;  Surgeon: Troy Sine, MD; LAD 20%, CFX 50%, OM1 95%, right PLB 30%, LV normal   . CARDIAC CATHETERIZATION N/A 11/30/2014   Procedure: Coronary Balloon Angioplasty;  Surgeon: Troy Sine, MD;  Angiosculpt scoring balloon and PTCA to the OM1 reducing stenosis from 95% to less than 10%  . CARDIAC CATHETERIZATION N/A 04/03/2015   Procedure: Left Heart Cath and Coronary Angiography;  Surgeon: Jolaine Artist, MD; dLAD 50%, CFX 90%, OM1 100%, PLA 15%, LVEDP 13    . CARDIAC CATHETERIZATION N/A 04/03/2015   Procedure: Coronary Stent Intervention;  Surgeon: Sherren Mocha, MD; 3.0x18 mm Xience DES to the CFX    . CARDIAC CATHETERIZATION N/A 08/02/2015   Procedure: Left Heart Cath and Coronary Angiography;  Surgeon: Troy Sine, MD;  Location: [facility];  Service: Cardiovascular;  Laterality: N/A;  . CARDIAC CATHETERIZATION N/A 08/02/2015   Procedure: Coronary Stent Intervention;  Surgeon: Troy Sine, MD;  Location: [facility];  Service: Cardiovascular;  Laterality: N/A;  . CARDIAC CATHETERIZATION N/A 06/25/2016   Procedure: Pericardiocentesis;  Surgeon: Will Meredith Leeds, MD;  Location: [facility];  Service: Cardiovascular;  Laterality: N/A;  . cardiac stents    . CHOLECYSTECTOMY OPEN  1990's  . COLONOSCOPY  2005   Dr. Laural Golden: pancolonic  divericula, polyp, path unknown currently  . COLONOSCOPY  2012   Dr. Oneida Alar: Normal TI, scattered diverticula in entire colon, small internal hemorrhoids, normal colon biopsies. Colonoscopy in 5-10 years.   . COLOSTOMY  05/1979  . COLOSTOMY REVERSAL  11/1979  . EP IMPLANTABLE DEVICE N/A 06/25/2016   Procedure: Lead Revision/Repair;  Surgeon: Will Meredith Leeds, MD;  Location: [facility];  Service: Cardiovascular;  Laterality: N/A;  . EP IMPLANTABLE DEVICE N/A 06/25/2016   Procedure: Pacemaker Implant;  Surgeon: Will Meredith Leeds, MD;  Location: [facility];  Service: Cardiovascular;  Laterality: N/A;  . EXCISIONAL HEMORRHOIDECTOMY  1970's  . EYE SURGERY Left 2000   "branch vein occlusion"  . EYE SURGERY Left ~ 2001   "smoothed out wrinkle"  . LEFT OOPHORECTOMY  05/1979   nicked bowel, peritonitis, colostomy; colostomy reversed 1981   . LOWER EXTREMITY ANGIOGRAM N/A 01/03/2014   Procedure: LOWER EXTREMITY ANGIOGRAM;  Surgeon: Wellington Hampshire, MD;  Location: [facility];  Service: Cardiovascular;  Laterality: N/A;  . Nuclear med stress test  10/2011   Small area  of mild ischemia inferoapically.  [name] PARTIAL HYSTERECTOMY  1970's   left ovaries, then ovaries removed later due tumors   . RIGHT OOPHORECTOMY  1970's      Allergies Penicillins and Percocet [oxycodone-acetaminophen]  Family History  Problem Relation Age of [place]set  . Heart disease Mother        deceased  . Heart disease Father        deceased, heart disease  . Diabetes Brother   . Heart disease Brother   . Thyroid disease Brother   . Heart disease Sister   . Heart disease Brother   . Thyroid disease Brother   . Lupus Daughter   . Colon cancer Neg Hx     Social History Social History   Tobacco Use  . Smoking status: Never Smoker  . Smokeless tobacco: Never Used  . Tobacco comment: Never smoked  Substance Use Topics  . Alcohol use: No    Alcohol/week: 0.0 oz  . Drug use: No    Review of  Systems  Constitutional: Positive fever/chills and generalized weakness.  Eyes: No visual changes. ENT: No sore throat. Cardiovascular: Denies chest pain. Respiratory: Positive shortness of breath and productive cough.  Gastrointestinal: No abdominal pain but "soreness" with coughing. Positive nausea, no vomiting.  No diarrhea.  No constipation. Genitourinary: Negative for dysuria. Musculoskeletal: Negative for back pain. Skin: Negative for rash. Neurological: Negative for headaches, focal weakness or numbness.  10-point ROS otherwise negative.  ____________________________________________   PHYSICAL EXAM:  VITAL SIGNS: ED Triage Vitals  Enc Vitals Group     BP 09/23/17 1910 (!) 154/50     Pulse Rate 09/23/17 1910 70     Resp 09/23/17 1910 16     Temp 09/23/17 1910 (!) 101.4 F (38.6 C)     Temp Source 09/23/17 1910 Oral     SpO2 09/23/17 1910 (!) 86 %     Weight 09/23/17 1913 158 lb (71.7 kg)     Pain Score 09/23/17 1912 5   Constitutional: Alert and oriented. Well appearing and in no acute distress. Eyes: Conjunctivae are normal.  Head: Atraumatic. Nose: No congestion/rhinnorhea. Mouth/Throat: Mucous membranes are moist.  Oropharynx non-erythematous. Neck: No stridor.  Cardiovascular: Normal rate, regular rhythm. Good peripheral circulation. Grossly normal heart sounds.   Respiratory: Increased respiratory effort.  No retractions. Lungs with end-expiratory wheezing bilaterally.  Gastrointestinal: Soft and nontender. No distention.  Musculoskeletal: No lower extremity tenderness with 3+ pitting edema bilaterally. No gross deformities of extremities. Neurologic:  Normal speech and language. No gross focal neurologic deficits are appreciated.  Skin:  Skin is warm, dry and intact. No rash noted.  ____________________________________________   LABS (all labs ordered are listed, but only abnormal results are displayed)  Labs Reviewed  COMPREHENSIVE METABOLIC PANEL -  Abnormal; Notable for the following components:      Result Value   Potassium 3.4 (*)    CO2 20 (*)    Glucose, Bld 174 (*)    BUN 26 (*)    Creatinine, Ser 1.30 (*)    Albumin 3.3 (*)    ALT 65 (*)    Total Bilirubin 1.6 (*)    GFR calc non Af Amer 40 (*)    GFR calc Af Amer 46 (*)    All other components within normal limits  BRAIN NATRIURETIC PEPTIDE - Abnormal; Notable for the following components:   B Natriuretic Peptide 841.0 (*)    All other components within normal limits  CBC WITH DIFFERENTIAL/PLATELET -  Abnormal; Notable for the following components:   WBC 15.8 (*)    RBC 3.24 (*)    Hemoglobin 9.7 (*)    HCT 29.9 (*)    RDW 15.7 (*)    Neutro Abs 14.1 (*)    Lymphs Abs 0.3 (*)    Monocytes Absolute 1.4 (*)    All other components within normal limits  CULTURE, BLOOD (ROUTINE X 2)  CULTURE, BLOOD (ROUTINE X 2)  RESPIRATORY PANEL BY PCR  INFLUENZA PANEL BY PCR (TYPE A & B)  TROPONIN I  TROPONIN I  TSH  T4, FREE  CBC WITH DIFFERENTIAL/PLATELET  BASIC METABOLIC PANEL  I-STAT TROPONIN, ED  I-STAT CG4 LACTIC ACID, ED  I-STAT CG4 LACTIC ACID, ED  CG4 I-STAT (LACTIC ACID)   ____________________________________________  EKG   EKG Interpretation  Date/Time:  Thursday September 23 2017 19:12:11 EDT Ventricular Rate:  69 PR Interval:  176 QRS Duration: 94 QT Interval:  462 QTC Calculation: 495 R Axis:   21 Text Interpretation:  Normal sinus rhythm ST & T wave abnormality, consider lateral ischemia Prolonged QT Abnormal ECG No STEMI.  Confirmed by Nanda Quinton [phone]) on 09/23/2017 7:24:43 PM       ____________________________________________  RADIOLOGY  Dg Chest 2 View  Result Date: 09/23/2017 CLINICAL DATA:  Chest pain short of breath EXAM: CHEST - 2 VIEW COMPARISON:  09/21/2017, 06/26/2016 FINDINGS: Left-sided pacing device as before. Small bilateral pleural effusions. Cardiomegaly with worsened vascular congestion and development of moderate interstitial  opacity consistent with edema. Aortic atherosclerosis. No pneumothorax. IMPRESSION: 1. Cardiomegaly with development of vascular congestion and moderate diffuse interstitial opacity consistent with pulmonary edema. Small pleural effusions. Electronically Signed   By: Donavan Foil M.D.   On: 09/23/2017 20:49    ____________________________________________   PROCEDURES  Procedure(s) performed:   Procedures  None    EKG Interpretation  Date/Time:  Thursday September 23 2017 19:12:11 EDT Ventricular Rate:  69 PR Interval:  176 QRS Duration: 94 QT Interval:  462 QTC Calculation: 495 R Axis:   21 Text Interpretation:  Normal sinus rhythm ST & T wave abnormality, consider lateral ischemia Prolonged QT Abnormal ECG No STEMI.  Confirmed by Nanda Quinton [phone]) on 09/23/2017 7:24:43 PM       ____________________________________________   INITIAL IMPRESSION / ASSESSMENT AND PLAN / ED COURSE  Pertinent labs & imaging results that were available during my care of the patient were reviewed by me and considered in my medical decision making (see chart for details).  With worsening shortness of breath with fever and wheezing on exam.  These are most consistent with a pneumonia.  Doubt PE.  Patient does have 2+ pitting edema bilaterally and a boot on the right foot from a broken toe.  No clinical signs or symptoms to suggest DVT/PE as edema appears to be bilateral.  Vision started increasing her Lasix today with no signs of acute volume overload other than leg swelling. Following labs, CXR, and will reduce fever and reassess after nebs. Patient is requiring 2L [place] O2.   Patient labs reviewed with elevated BNP and leukocytosis. Patient currently requiring O2. CXR shows mainly edema without focal infiltrate but given fever and negative flu will cover with abx. Plan for admission for further treatment.   Discussed patient's case with Hospitalist to request admission. Patient and family (if present)  updated with plan. Care transferred to Hospitalist service.  I reviewed all nursing notes, vitals, pertinent old records, EKGs, labs, imaging (as available).  ____________________________________________  FINAL CLINICAL IMPRESSION(S) / ED DIAGNOSES  Final diagnoses:  Hypoxia  SOB (shortness of breath)     MEDICATIONS GIVEN DURING THIS VISIT:  Medications  levofloxacin (LEVAQUIN) IVPB 750 mg (has no administration in time range)  amiodarone (PACERONE) tablet 200 mg (has no administration in time range)  apixaban (ELIQUIS) tablet 5 mg (has no administration in time range)  albuterol (PROVENTIL) (2.5 MG/3ML) 0.083% nebulizer solution 2.5 mg (has no administration in time range)  atorvastatin (LIPITOR) tablet 80 mg (has no administration in time range)  carvedilol (COREG) tablet 25 mg (has no administration in time range)  clopidogrel (PLAVIX) tablet 75 mg (has no administration in time range)  diltiazem (CARDIZEM CD) 24 hr capsule 360 mg (has no administration in time range)  hydrALAZINE (APRESOLINE) tablet 100 mg (has no administration in time range)  insulin aspart protamine- aspart (NOVOLOG MIX 70/30) injection 25 Units (has no administration in time range)  levothyroxine (SYNTHROID, LEVOTHROID) tablet 175 mcg (has no administration in time range)  losartan (COZAAR) tablet 100 mg (has no administration in time range)  potassium chloride SA (K-DUR,KLOR-CON) CR tablet 20 mEq (has no administration in time range)  rOPINIRole (REQUIP) tablet 0.5 mg (has no administration in time range)  sodium chloride flush (NS) 0.9 % injection 3 mL (has no administration in time range)  sodium chloride flush (NS) 0.9 % injection 3 mL (has no administration in time range)  0.9 %  sodium chloride infusion (has no administration in time range)  aspirin EC tablet 81 mg (has no administration in time range)  acetaminophen (TYLENOL) tablet 650 mg (has no administration in time range)  ondansetron (ZOFRAN)  injection 4 mg (has no administration in time range)  furosemide (LASIX) injection 40 mg (has no administration in time range)  insulin aspart (novoLOG) injection 0-9 Units (has no administration in time range)  acetaminophen (TYLENOL) tablet 1,000 mg (1,000 mg Oral Given 09/23/17 1949)  ondansetron (ZOFRAN) injection 4 mg (4 mg Intravenous Given 09/23/17 1950)  ipratropium-albuterol (DUONEB) 0.5-2.5 (3) MG/3ML nebulizer solution 3 mL (3 mLs Nebulization Given 09/23/17 1954)  furosemide (LASIX) injection 60 mg (60 mg Intravenous Given 09/23/17 2120)  levofloxacin (LEVAQUIN) IVPB 750 mg (750 mg Intravenous Transfusing/Transfer 09/23/17 2226)    Note:  This document was prepared using Dragon voice recognition software and may include unintentional dictation errors.  Nanda Quinton, MD Emergency Medicine    Marcus Groll, [facility], MD 09/23/17 [phone]

## 2017-09-23 NOTE — Telephone Encounter (Signed)
Starting today, 09/23/17, pt will increase lasix and potassium doses as directed.She will get bmet on Sat 4/6 at [facility]

## 2017-09-23 NOTE — Telephone Encounter (Signed)
Can increase Lasix to 40 mg bid x 4 days and double KCl dose during this time as well. Check BMET in 3 days. Call her back to see if symptoms improve with this change.

## 2017-09-23 NOTE — Progress Notes (Signed)
Remote pacemaker transmission.   

## 2017-09-23 NOTE — Telephone Encounter (Signed)
Patient is asking to be seen today due to feet swelling and SOB. Advised patient there is no availability. Please call patient. / tg

## 2017-09-23 NOTE — ED Triage Notes (Signed)
Pt was seen at Dr. [name] office Tuesday for SOB. Pt received breathing treatment, z pack, and steroid shot. Pt also C/O chest pain that started Tuesday, pt took one nitro around 1330 with relief.

## 2017-09-23 NOTE — Progress Notes (Signed)
Pharmacy Antibiotic Note  Virginia Huber is a 72 y.o. female admitted on 09/23/2017 with CAP.  Pharmacy has been consulted for levaquin dosing.  Plan: Levaquin 750 mg IV q48 hours Monitor labs, c/s, and patient improvement  Weight: 158 lb (71.7 kg)  Temp (24hrs), Avg:101.4 F (38.6 C), Min:101.4 F (38.6 C), Max:101.4 F (38.6 C)  Recent Labs  Lab 09/23/17 1940 09/23/17 1947  WBC 15.8*  --   CREATININE 1.30*  --   LATICACIDVEN  --  0.95    Estimated Creatinine Clearance: 37.1 mL/min (A) (by C-G formula based on SCr of 1.3 mg/dL (H)).    Allergies  Allergen Reactions  . Penicillins Hives    Has patient had a PCN reaction causing immediate rash, facial/tongue/throat swelling, SOB or lightheadedness with hypotension: Yes Has patient had a PCN reaction causing severe rash involving mucus membranes or skin necrosis: No Has patient had a PCN reaction that required hospitalization No Has patient had a PCN reaction occurring within the last 10 years: No If all of the above answers are "NO", then may proceed with Cephalosporin use.  [name] Percocet [Oxycodone-Acetaminophen] Nausea And Vomiting    Antimicrobials this admission: Levaquin 4/4 >>     Dose adjustments this admission: Levaquin to q48 hours for CrCl of 37  Microbiology results: 4/4 BCx: pending   Thank you for allowing pharmacy to be a part of this patient's care.  Ramond Craver 09/23/2017 9:18 PM

## 2017-09-24 DIAGNOSIS — J9601 Acute respiratory failure with hypoxia: Secondary | ICD-10-CM

## 2017-09-24 DIAGNOSIS — E038 Other specified hypothyroidism: Secondary | ICD-10-CM

## 2017-09-24 DIAGNOSIS — I5031 Acute diastolic (congestive) heart failure: Secondary | ICD-10-CM

## 2017-09-24 DIAGNOSIS — E1159 Type 2 diabetes mellitus with other circulatory complications: Secondary | ICD-10-CM

## 2017-09-24 DIAGNOSIS — N183 Chronic kidney disease, stage 3 (moderate): Secondary | ICD-10-CM

## 2017-09-24 DIAGNOSIS — I10 Essential (primary) hypertension: Secondary | ICD-10-CM

## 2017-09-24 LAB — CBC WITH DIFFERENTIAL/PLATELET
BASOS ABS: 0 10*3/uL (ref 0.0–0.1)
Basophils Relative: 0 %
Eosinophils Absolute: 0 10*3/uL (ref 0.0–0.7)
Eosinophils Relative: 0 %
HEMATOCRIT: 28.2 % — AB (ref 36.0–46.0)
HEMOGLOBIN: 9.1 g/dL — AB (ref 12.0–15.0)
LYMPHS PCT: 3 %
Lymphs Abs: 0.5 10*3/uL — ABNORMAL LOW (ref 0.7–4.0)
MCH: 29.8 pg (ref 26.0–34.0)
MCHC: 32.3 g/dL (ref 30.0–36.0)
MCV: 92.5 fL (ref 78.0–100.0)
Monocytes Absolute: 1.3 10*3/uL — ABNORMAL HIGH (ref 0.1–1.0)
Monocytes Relative: 9 %
NEUTROS ABS: 12.9 10*3/uL — AB (ref 1.7–7.7)
Neutrophils Relative %: 88 %
Platelets: 302 10*3/uL (ref 150–400)
RBC: 3.05 MIL/uL — AB (ref 3.87–5.11)
RDW: 15.9 % — ABNORMAL HIGH (ref 11.5–15.5)
WBC: 14.7 10*3/uL — AB (ref 4.0–10.5)

## 2017-09-24 LAB — GLUCOSE, CAPILLARY
GLUCOSE-CAPILLARY: 124 mg/dL — AB (ref 65–99)
Glucose-Capillary: 176 mg/dL — ABNORMAL HIGH (ref 65–99)
Glucose-Capillary: 125 mg/dL — ABNORMAL HIGH (ref 65–99)
Glucose-Capillary: 178 mg/dL — ABNORMAL HIGH (ref 65–99)

## 2017-09-24 LAB — BASIC METABOLIC PANEL
ANION GAP: 12 (ref 5–15)
BUN: 28 mg/dL — ABNORMAL HIGH (ref 6–20)
CHLORIDE: 102 mmol/L (ref 101–111)
CO2: 24 mmol/L (ref 22–32)
Calcium: 8.4 mg/dL — ABNORMAL LOW (ref 8.9–10.3)
Creatinine, Ser: 1.35 mg/dL — ABNORMAL HIGH (ref 0.44–1.00)
GFR calc Af Amer: 44 mL/min — ABNORMAL LOW (ref >60)
GFR, EST NON AFRICAN AMERICAN: 38 mL/min — AB (ref >60)
GLUCOSE: 155 mg/dL — AB (ref 65–99)
Potassium: 4 mmol/L (ref 3.5–5.1)
Sodium: 138 mmol/L (ref 135–145)

## 2017-09-24 LAB — TROPONIN I
Troponin I: 0.03 ng/mL (ref <0.03)
Troponin I: 0.03 ng/mL (ref <0.03)

## 2017-09-24 LAB — T4, FREE: FREE T4: 1.8 ng/dL — AB (ref 0.61–1.12)

## 2017-09-24 MED ORDER — GUAIFENESIN-DM 100-10 MG/5ML PO SYRP
5.0000 mL | ORAL_SOLUTION | ORAL | Status: DC | PRN
  Administered 2017-09-24 – 2017-09-26 (×4): 5 mL via ORAL
  Filled 2017-09-24 (×4): qty 5

## 2017-09-24 MED ORDER — LEVOTHYROXINE SODIUM 100 MCG PO TABS
200.0000 ug | ORAL_TABLET | Freq: Every day | ORAL | Status: DC
  Administered 2017-09-25 – 2017-09-30 (×6): 200 ug via ORAL
  Filled 2017-09-24 (×6): qty 2

## 2017-09-24 MED ORDER — ALBUTEROL SULFATE (2.5 MG/3ML) 0.083% IN NEBU
2.5000 mg | INHALATION_SOLUTION | Freq: Four times a day (QID) | RESPIRATORY_TRACT | Status: DC
  Administered 2017-09-24 (×2): 2.5 mg via RESPIRATORY_TRACT
  Filled 2017-09-24 (×2): qty 3

## 2017-09-24 MED ORDER — IPRATROPIUM-ALBUTEROL 0.5-2.5 (3) MG/3ML IN SOLN
3.0000 mL | Freq: Four times a day (QID) | RESPIRATORY_TRACT | Status: DC
  Administered 2017-09-24 – 2017-09-30 (×24): 3 mL via RESPIRATORY_TRACT
  Filled 2017-09-24 (×24): qty 3

## 2017-09-24 MED ORDER — ENSURE ENLIVE PO LIQD
237.0000 mL | ORAL | Status: DC
  Administered 2017-09-24: 237 mL via ORAL

## 2017-09-24 MED ORDER — METHYLPREDNISOLONE SODIUM SUCC 125 MG IJ SOLR
60.0000 mg | Freq: Two times a day (BID) | INTRAMUSCULAR | Status: DC
  Administered 2017-09-24 – 2017-09-25 (×2): 60 mg via INTRAVENOUS
  Filled 2017-09-24 (×2): qty 2

## 2017-09-24 NOTE — Progress Notes (Signed)
Initial Nutrition Assessment  INTERVENTION:  Heart Healthy / CHO modified diet  NUTRITION DIAGNOSIS:  Inadequate oral intake related to acute illness, chronic illness(Acute on chronic HF, shortness of breath) as evidenced by estimated needs, per patient/family report(Meal intake 50-60% ).  GOAL:   Patient will meet greater than or equal to 90% of their needs   MONITOR:   PO intake, Supplement acceptance, Labs, Weight trends  REASON FOR ASSESSMENT:   Consult - assess nutrition status  ASSESSMENT:  Patient presents form home with hx of CKD-3, DM, CAD, HTN. Febrile on admission and complaining of shortness of breath. Acute on chronic- HF (BNP-841).   Patient consumes a regular diet at home. She is consuming 50-60% of meals which is meeting approximately 70% of est needs.  Patient weight hx is stable between 69-72 kg the past 10 months. She has gained ~ 10 lbs over the past year. Mild pitting lower extremity edema.   Labs: BMP Latest Ref Rng & Units 09/24/2017 09/23/2017 08/05/2017  Glucose 65 - 99 mg/dL 155(H) 174(H) 139(H)  BUN 6 - 20 mg/dL 28(H) 26(H) 20  Creatinine 0.44 - 1.00 mg/dL 1.35(H) 1.30(H) 1.24(H)  BUN/Creat Ratio 6 - 22 (calc) - - 16  Sodium 135 - 145 mmol/L 138 135 141  Potassium 3.5 - 5.1 mmol/L 4.0 3.4(L) 3.8  Chloride 101 - 111 mmol/L 102 101 104  CO2 22 - 32 mmol/L 24 20(L) 27  Calcium 8.9 - 10.3 mg/dL 8.4(L) 9.1 9.3     Meds:   . albuterol  2.5 mg Nebulization Q6H  . amiodarone  200 mg Oral Daily  . apixaban  5 mg Oral BID  . aspirin EC  81 mg Oral Daily  . atorvastatin  80 mg Oral q1800  . carvedilol  25 mg Oral BID WC  . clopidogrel  75 mg Oral Daily  . diltiazem  360 mg Oral Daily  . furosemide  40 mg Intravenous Q12H  . hydrALAZINE  100 mg Oral Q8H  . insulin aspart  0-9 Units Subcutaneous TID WC  . insulin aspart protamine- aspart  25 Units Subcutaneous BID WC  . levothyroxine  175 mcg Oral QAC breakfast  . losartan  100 mg Oral Daily  .  potassium chloride SA  20 mEq Oral Daily  . rOPINIRole  0.5 mg Oral QHS  . sodium chloride flush  3 mL Intravenous Q12H    NUTRITION - FOCUSED PHYSICAL EXAM:  Deferred -unable to perform today  Diet Order:  Diet heart healthy/carb modified Room service appropriate? Yes; Fluid consistency: Thin  EDUCATION NEEDS:   Education needs have been addressed(encourage low sodium diet and tracking of daily weights) Skin:  Skin Assessment: Reviewed RN Assessment  Last BM:  4/5 large  Height:   Ht Readings from Last 1 Encounters:  09/23/17 5\' 3"  (1.6 m)    Weight:   Wt Readings from Last 1 Encounters:  09/24/17 162 lb 1.6 oz (73.5 kg)    Ideal Body Weight:  52.2 kg  BMI:  Body mass index is 28.71 kg/m.  Estimated Nutritional Needs:   Kcal:  9735-3299   Protein:  65-70 gr  Fluid:  < 2 liters daily   Colman Cater MS,RD,CSG,LDN Office: [phone] Pager: [phone]

## 2017-09-24 NOTE — Progress Notes (Signed)
PROGRESS NOTE    Virginia Huber  VWU:[id number] DOB: 02-Aug-1945 DOA: 09/23/2017 PCP: Sharilyn Sites, MD    Brief Narrative:  72 y/o female admitted to the hospital with shortness of breath and respiratory failure. Found to have acute on chronic diastolic chf and likely acute bronchitis. Started on IV diuresis, neb treatments and IV steroids. Discharge home once respiratory status has improved.   Assessment & Plan:   Principal Problem:   Acute respiratory failure with hypoxia (HCC) Active Problems:   Essential hypertension   Hypothyroidism   DM type 2 causing vascular disease (HCC)   Acute diastolic CHF (congestive heart failure), NYHA class 3 (HCC)   CKD (chronic kidney disease) stage 3, GFR 30-59 ml/min (HCC)   Fever   1. Acute respiratory failure with hypoxia.  Patient presented with oxygen saturations in the 80s.  Supplemental oxygen has been applied.  Possibly combination of CHF exacerbation with acute bronchitis.  Wean off oxygen as tolerated. 2. Acute on chronic diastolic congestive heart failure.  BNP elevated at 841 on admission.  Recent echocardiogram from 07/2017 showed grade 2 diastolic dysfunction.  She is on intravenous Lasix.  We will hold off on further losartan since blood pressures are running low making diuresis challenging.  She still has evidence of volume overload.  Continue current treatments and monitor renal function. 3. Acute bronchitis.  Respiratory viral panel in process.  Currently on Levaquin.  Since she is still short of breath and wheezing, will add bronchodilators and intravenous steroids. 4. Atrial fibrillation.  Rate controlled on amiodarone, diltiazem and Coreg.  Anticoagulation with apixaban 5. Hypertension.  Blood pressure running on the lower side.  Currently on Coreg, diltiazem.  Hydralazine and losartan currently on hold since patient is being actively diuresed. 6. Diabetes.  Continue on 70/30 insulin.  Metformin currently on hold.  Monitor blood  sugars especially since being started on steroids. 7. Hypothyroidism.  TSH elevated as it was in 07/2017.  Free T4 is also in similar range.  Patient reports compliance of medications.  We will increase Synthroid dose from 160mcg to 261mcg daily  8. Chronic kidney disease stage 3. Creatinine is currently at baseline. Continue to monitor with diuresis   DVT prophylaxis: Apixaban Code Status: full code Family Communication: Discussed with husband at the bedside Disposition Plan: Discharge home once improved   Consultants:     Procedures:     Antimicrobials:   Levofloxacin 4/4>    Subjective: Still short of breath, has cough  Objective: Vitals:   09/24/17 1048 09/24/17 1354 09/24/17 1400 09/24/17 1442  BP: 108/87 (!) 120/37 (!) 103/38   Pulse: 61 61 61   Resp:  18 20   Temp:      TempSrc:      SpO2:  90%  94%  Weight:      Height:        Intake/Output Summary (Last 24 hours) at 09/24/2017 1757 Last data filed at 09/24/2017 1300 Gross per 24 hour  Intake 480 ml  Output 1000 ml  Net -520 ml   Filed Weights   09/23/17 1913 09/23/17 2257 09/24/17 0500  Weight: 71.7 kg (158 lb) 74.1 kg (163 lb 6.4 oz) 73.5 kg (162 lb 1.6 oz)    Examination:  General exam: Appears calm and comfortable  Respiratory system: bilateral wheezes, crackles at right base. Respiratory effort normal. Cardiovascular system: S1 & S2 heard, RRR. No JVD, murmurs, rubs, gallops or clicks. 1+ pedal edema. Gastrointestinal system: Abdomen is nondistended, soft and  nontender. No organomegaly or masses felt. Normal bowel sounds heard. Central nervous system: Alert and oriented. No focal neurological deficits. Extremities: Symmetric 5 x 5 power. Skin: No rashes, lesions or ulcers Psychiatry: Judgement and insight appear normal. Mood & affect appropriate.     Data Reviewed: I have personally reviewed following labs and imaging studies  CBC: Recent Labs  Lab 09/23/17 1940 09/24/17 0007  WBC 15.8*  14.7*  NEUTROABS 14.1* 12.9*  HGB 9.7* 9.1*  HCT 29.9* 28.2*  MCV 92.3 92.5  PLT 309 852   Basic Metabolic Panel: Recent Labs  Lab 09/23/17 1940 09/24/17 0559  NA 135 138  K 3.4* 4.0  CL 101 102  CO2 20* 24  GLUCOSE 174* 155*  BUN 26* 28*  CREATININE 1.30* 1.35*  CALCIUM 9.1 8.4*   GFR: Estimated Creatinine Clearance: 36.2 mL/min (A) (by C-G formula based on SCr of 1.35 mg/dL (H)). Liver Function Tests: Recent Labs  Lab 09/23/17 1940  AST 35  ALT 65*  ALKPHOS 64  BILITOT 1.6*  PROT 6.7  ALBUMIN 3.3*   No results for input(s): LIPASE, AMYLASE in the last 168 hours. No results for input(s): AMMONIA in the last 168 hours. Coagulation Profile: No results for input(s): INR, PROTIME in the last 168 hours. Cardiac Enzymes: Recent Labs  Lab 09/24/17 0007 09/24/17 0559  TROPONINI 0.03* 0.03*   BNP (last 3 results) No results for input(s): PROBNP in the last 8760 hours. HbA1C: No results for input(s): HGBA1C in the last 72 hours. CBG: Recent Labs  Lab 09/24/17 0757 09/24/17 1123 09/24/17 1705  GLUCAP 176* 124* 125*   Lipid Profile: No results for input(s): CHOL, HDL, LDLCALC, TRIG, CHOLHDL, LDLDIRECT in the last 72 hours. Thyroid Function Tests: Recent Labs    09/23/17 2210 09/23/17 2212  TSH  --  11.574*  FREET4 1.80*  --    Anemia Panel: No results for input(s): VITAMINB12, FOLATE, FERRITIN, TIBC, IRON, RETICCTPCT in the last 72 hours. Sepsis Labs: Recent Labs  Lab 09/23/17 1947 09/23/17 2232  LATICACIDVEN 0.95 0.82    Recent Results (from the past 240 hour(s))  Culture, blood (routine x 2)     Status: None (Preliminary result)   Collection Time: 09/23/17  7:33 PM  Result Value Ref Range Status   Specimen Description LEFT ANTECUBITAL DRAWN BY RN  Final   Special Requests   Final    BOTTLES DRAWN AEROBIC AND ANAEROBIC Blood Culture adequate volume   Culture   Final    NO GROWTH < 12 HOURS Performed at [facility], [address],  [place], [place] [address]    Report Status PENDING  Incomplete  Culture, blood (routine x 2)     Status: None (Preliminary result)   Collection Time: 09/23/17  7:41 PM  Result Value Ref Range Status   Specimen Description RIGHT ANTECUBITAL  Final   Special Requests   Final    BOTTLES DRAWN AEROBIC AND ANAEROBIC Blood Culture adequate volume   Culture   Final    NO GROWTH < 12 HOURS Performed at [facility], [address], [place], [place] [address]    Report Status PENDING  Incomplete  Respiratory Panel by PCR     Status: Abnormal (Preliminary result)   Collection Time: 09/23/17 11:20 PM  Result Value Ref Range Status   Adenovirus NOT DETECTED NOT DETECTED Final   Coronavirus 229E NOT DETECTED NOT DETECTED Final   Coronavirus HKU1 NOT DETECTED NOT DETECTED Final   Coronavirus NL63 NOT DETECTED  NOT DETECTED Final   Coronavirus OC43 DETECTED (A) NOT DETECTED Final   Metapneumovirus NOT DETECTED NOT DETECTED Final   Rhinovirus / Enterovirus NOT DETECTED NOT DETECTED Final   Influenza A NOT DETECTED NOT DETECTED Final   Influenza B NOT DETECTED NOT DETECTED Final   Parainfluenza Virus 1 NOT DETECTED NOT DETECTED Final   Parainfluenza Virus 2 NOT DETECTED NOT DETECTED Final   Parainfluenza Virus 3 NOT DETECTED NOT DETECTED Final   Parainfluenza Virus 4 NOT DETECTED NOT DETECTED Final   Respiratory Syncytial Virus NOT DETECTED NOT DETECTED Final   Bordetella pertussis NOT DETECTED NOT DETECTED Final   Chlamydophila pneumoniae NOT DETECTED NOT DETECTED Final    Comment: Performed at [facility], [place] [address], [place], [place] [address]   Mycoplasma pneumoniae PENDING NOT DETECTED Incomplete         Radiology Studies: Dg Chest 2 View  Result Date: 09/23/2017 CLINICAL DATA:  Chest pain short of breath EXAM: CHEST - 2 VIEW COMPARISON:  09/21/2017, 06/26/2016 FINDINGS: Left-sided pacing device as before. Small bilateral pleural effusions. Cardiomegaly with worsened vascular  congestion and development of moderate interstitial opacity consistent with edema. Aortic atherosclerosis. No pneumothorax. IMPRESSION: 1. Cardiomegaly with development of vascular congestion and moderate diffuse interstitial opacity consistent with pulmonary edema. Small pleural effusions. Electronically Signed   By: Donavan Foil M.D.   On: 09/23/2017 20:49        Scheduled Meds: . amiodarone  200 mg Oral Daily  . apixaban  5 mg Oral BID  . aspirin EC  81 mg Oral Daily  . atorvastatin  80 mg Oral q1800  . carvedilol  25 mg Oral BID WC  . clopidogrel  75 mg Oral Daily  . diltiazem  360 mg Oral Daily  . feeding supplement (ENSURE ENLIVE)  237 mL Oral Q24H  . furosemide  40 mg Intravenous Q12H  . hydrALAZINE  100 mg Oral Q8H  . insulin aspart  0-9 Units Subcutaneous TID WC  . insulin aspart protamine- aspart  25 Units Subcutaneous BID WC  . ipratropium-albuterol  3 mL Nebulization Q6H  . levothyroxine  175 mcg Oral QAC breakfast  . losartan  100 mg Oral Daily  . methylPREDNISolone (SOLU-MEDROL) injection  60 mg Intravenous Q12H  . potassium chloride SA  20 mEq Oral Daily  . rOPINIRole  0.5 mg Oral QHS  . sodium chloride flush  3 mL Intravenous Q12H   Continuous Infusions: . sodium chloride    . [START ON 09/25/2017] levofloxacin (LEVAQUIN) IV       LOS: 1 day    Time spent: 43mins    Kathie Dike, MD [facility] Pager [phone]  If 7PM-7AM, please contact night-coverage [email or web address] Password [facility] 09/24/2017, 5:57 PM

## 2017-09-24 NOTE — Plan of Care (Signed)
  Problem: Acute Rehab PT Goals(only PT should resolve) Goal: Patient Will Transfer Sit To/From Stand Outcome: Progressing Flowsheets (Taken 09/24/2017 1227) Patient will transfer sit to/from stand: with supervision Goal: Pt Will Transfer Bed To Chair/Chair To Bed Outcome: Progressing Flowsheets (Taken 09/24/2017 1227) Pt will Transfer Bed to Chair/Chair to Bed: with supervision Goal: Pt Will Ambulate Outcome: Progressing Flowsheets (Taken 09/24/2017 1227) Pt will Ambulate: with supervision;75 feet;with rolling walker   12:27 PM, 09/24/17 Lonell Grandchild, MPT Physical Therapist with [facility] [phone] office [phone] mobile phone

## 2017-09-24 NOTE — Progress Notes (Signed)
CRITICAL VALUE ALERT  Critical Value: Troponin 0.03  Date & Time Notied:  09/24/2017 0130  Provider Notified: Georgiann Mohs  Orders Received/Actions taken: no new orders at this time

## 2017-09-24 NOTE — Care Management Note (Signed)
Case Management Note  Patient Details  Name: Virginia Huber MRN: [id number] Date of Birth: 1946-02-14  Subjective/Objective:  CHF. From home with husband. Acutely on oxygen. Recommended for [facility] PT and RW. Discussed with patient and husband. Both agreeable. Offered choice.                  Action/Plan:[facility] notified and will obtain orders via Epic. RW will be delivered to room today for possible discharge over the weekend. Patient will need to be weaned / and have home O2 eval.    Expected Discharge Date:      unk            Expected Discharge Plan:  [place]  In-House Referral:     Discharge planning Services  CM Consult  Post Acute Care Choice:  Home Health Choice offered to:  Patient  DME Arranged:  Walker rolling DME Agency:  [place]:  PT [facility] Agency:  [place]  Status of Service:  Completed, signed off  If discussed at [place] of Stay Meetings, dates discussed:    Additional Comments:  Kristofor Michalowski, Chauncey [place], RN 09/24/2017, 2:41 PM

## 2017-09-24 NOTE — Care Management Important Message (Signed)
Important Message  Patient Details  Name: Virginia Huber MRN: [id number] Date of Birth: 1946-03-19   Medicare Important Message Given:  Yes    Shelda Altes 09/24/2017, 10:09 AM

## 2017-09-24 NOTE — Evaluation (Signed)
Physical Therapy Evaluation Patient Details Name: Virginia Huber MRN: [id number] DOB: 11-16-1945 Today's Date: 09/24/2017   History of Present Illness  Virginia Huber is a 72 y.o. female with medical history significant of DM, afib, PAD, CAD, hypothyroidism, HLD, and HTN presenting with fever and SOB.  She went to Dr. [name] Tuesday for a checkup and noticed SOB - he thought it was a URI and he gave her a steroid injection and a Z-pack and a breathing treatment.  It has continued to progress since and today she couldn't really breathe at all.  SOB is constant this week, not related to exertion.  Cough productive of "crud" - gray-looking.  Fever to 101.4 tonight, this was the first.  She has 2 more doses of azithromycin left to complete the course.  She was unable to lie flat at home.  She does feel better with the O2.  +LE edema.  She has not been sleeping because she couldn't breathe.  Mild chest discomfort that she attributes to being so short of breath.    Clinical Impression  Patient limited for functional mobility as stated below secondary to BLE weakness, fatigue and fair/poor standing balance.  Patient desaturated to 85% while on room air while walking.   Patient will benefit from continued physical therapy in hospital and recommended venue below to increase strength, balance, endurance for safe ADLs and gait.    Follow Up Recommendations Home health PT;Supervision/Assistance - 24 hour    Equipment Recommendations  Rolling walker with 5" wheels    Recommendations for Other Services       Precautions / Restrictions Precautions Precautions: Fall Restrictions Weight Bearing Restrictions: No      Mobility  Bed Mobility Overal bed mobility: Modified Independent                Transfers Overall transfer level: Needs assistance Equipment used: 1 person hand held assist Transfers: Sit to/from Stand;Stand Pivot Transfers Sit to Stand: Min assist Stand pivot transfers: Min  assist       General transfer comment: tendency to lean on nearby objects for support, required hand held assist  Ambulation/Gait Ambulation/Gait assistance: Min assist Ambulation Distance (Feet): 40 Feet Assistive device: 1 person hand held assist Gait Pattern/deviations: Decreased step length - left;Decreased stance time - right;Decreased stride length   Gait velocity interpretation: Below normal speed for age/gender General Gait Details: tendency to lean on nearby objects for support, unsteady with VC's to let arms swing, on room air with O2 saturation dropping to 85%  Stairs            Wheelchair Mobility    Modified Rankin (Stroke Patients Only)       Balance Overall balance assessment: Needs assistance Sitting-balance support: Feet supported;No upper extremity supported Sitting balance-Leahy Scale: Good     Standing balance support: During functional activity;No upper extremity supported Standing balance-Leahy Scale: Poor Standing balance comment: fair with hand held assist                             Pertinent Vitals/Pain Pain Assessment: No/denies pain    Home Living Family/patient expects to be discharged to:: Private residence Living Arrangements: Spouse/significant other   Type of Home: House Home Access: Stairs to enter Entrance Stairs-Rails: Right;Left;Can reach both Entrance Stairs-Number of Steps: 3 steps into house, 3 steps into den with bilateral siderails for both Home Layout: One level Home Equipment: Shower seat - built in  Additional Comments: patient had fall 2 weeks ago with right foot fracture and has been wearing CAM boot since    Prior Function Level of Independence: Independent         Comments: household distances wearing cam boot     Hand Dominance        Extremity/Trunk Assessment   Upper Extremity Assessment Upper Extremity Assessment: Generalized weakness    Lower Extremity Assessment Lower Extremity  Assessment: Generalized weakness    Cervical / Trunk Assessment Cervical / Trunk Assessment: Normal  Communication   Communication: No difficulties  Cognition Arousal/Alertness: Awake/alert Behavior During Therapy: WFL for tasks assessed/performed Overall Cognitive Status: Within Functional Limits for tasks assessed                                        General Comments      Exercises     Assessment/Plan    PT Assessment Patient needs continued PT services  PT Problem List Decreased strength;Decreased activity tolerance;Decreased balance;Decreased mobility       PT Treatment Interventions Gait training;Functional mobility training;Stair training;Therapeutic activities;Patient/family education    PT Goals (Current goals can be found in the Care Plan section)  Acute Rehab PT Goals Patient Stated Goal: return home PT Goal Formulation: With patient/family Time For Goal Achievement: 10/01/17 Potential to Achieve Goals: Good    Frequency Min 3X/week   Barriers to discharge        Co-evaluation               AM-PAC PT "6 Clicks" Daily Activity  Outcome Measure Difficulty turning over in bed (including adjusting bedclothes, sheets and blankets)?: None Difficulty moving from lying on back to sitting on the side of the bed? : None Difficulty sitting down on and standing up from a chair with arms (e.g., wheelchair, bedside commode, etc,.)?: A Little Help needed moving to and from a bed to chair (including a wheelchair)?: A Little Help needed walking in hospital room?: A Little Help needed climbing 3-5 steps with a railing? : A Lot 6 Click Score: 19    End of Session Equipment Utilized During Treatment: Gait belt Activity Tolerance: Patient tolerated treatment well;Patient limited by fatigue(Patient limited by SOB) Patient left: in chair;with call bell/phone within reach;with family/visitor present Nurse Communication: Mobility status;Other  (comment)(RN notified that patient left up in chair and desaturations with exertion) PT Visit Diagnosis: Unsteadiness on feet (R26.81);Other abnormalities of gait and mobility (R26.89);Muscle weakness (generalized) (M62.81)    Time: 5520-8022 PT Time Calculation (min) (ACUTE ONLY): 31 min   Charges:   PT Evaluation $PT Eval Moderate Complexity: 1 Mod PT Treatments $Therapeutic Activity: 23-37 mins   PT G Codes:        12:25 PM, 2017-10-14 Lonell Grandchild, MPT Physical Therapist with [facility] [phone] office [phone] mobile phone

## 2017-09-25 DIAGNOSIS — R0600 Dyspnea, unspecified: Secondary | ICD-10-CM

## 2017-09-25 LAB — BASIC METABOLIC PANEL
Anion gap: 14 (ref 5–15)
BUN: 38 mg/dL — ABNORMAL HIGH (ref 6–20)
CALCIUM: 8.7 mg/dL — AB (ref 8.9–10.3)
CHLORIDE: 100 mmol/L — AB (ref 101–111)
CO2: 21 mmol/L — AB (ref 22–32)
Creatinine, Ser: 1.44 mg/dL — ABNORMAL HIGH (ref 0.44–1.00)
GFR calc non Af Amer: 35 mL/min — ABNORMAL LOW (ref >60)
GFR, EST AFRICAN AMERICAN: 41 mL/min — AB (ref >60)
Glucose, Bld: 262 mg/dL — ABNORMAL HIGH (ref 65–99)
POTASSIUM: 4.3 mmol/L (ref 3.5–5.1)
Sodium: 135 mmol/L (ref 135–145)

## 2017-09-25 LAB — RESPIRATORY PANEL BY PCR
Adenovirus: NOT DETECTED
Bordetella pertussis: NOT DETECTED
CORONAVIRUS 229E-RVPPCR: NOT DETECTED
CORONAVIRUS NL63-RVPPCR: NOT DETECTED
CORONAVIRUS OC43-RVPPCR: DETECTED — AB
Chlamydophila pneumoniae: NOT DETECTED
Coronavirus HKU1: NOT DETECTED
INFLUENZA B-RVPPCR: NOT DETECTED
Influenza A: NOT DETECTED
METAPNEUMOVIRUS-RVPPCR: NOT DETECTED
Mycoplasma pneumoniae: NOT DETECTED
PARAINFLUENZA VIRUS 1-RVPPCR: NOT DETECTED
PARAINFLUENZA VIRUS 2-RVPPCR: NOT DETECTED
Parainfluenza Virus 3: NOT DETECTED
Parainfluenza Virus 4: NOT DETECTED
RESPIRATORY SYNCYTIAL VIRUS-RVPPCR: NOT DETECTED
Rhinovirus / Enterovirus: NOT DETECTED

## 2017-09-25 LAB — GLUCOSE, CAPILLARY
GLUCOSE-CAPILLARY: 255 mg/dL — AB (ref 65–99)
Glucose-Capillary: 351 mg/dL — ABNORMAL HIGH (ref 65–99)
Glucose-Capillary: 336 mg/dL — ABNORMAL HIGH (ref 65–99)
Glucose-Capillary: 381 mg/dL — ABNORMAL HIGH (ref 65–99)
Glucose-Capillary: 320 mg/dL — ABNORMAL HIGH (ref 65–99)
Glucose-Capillary: 382 mg/dL — ABNORMAL HIGH (ref 65–99)

## 2017-09-25 LAB — CBC
HCT: 27.9 % — ABNORMAL LOW (ref 36.0–46.0)
HEMOGLOBIN: 8.9 g/dL — AB (ref 12.0–15.0)
MCH: 29.6 pg (ref 26.0–34.0)
MCHC: 31.9 g/dL (ref 30.0–36.0)
MCV: 92.7 fL (ref 78.0–100.0)
Platelets: 301 10*3/uL (ref 150–400)
RBC: 3.01 MIL/uL — ABNORMAL LOW (ref 3.87–5.11)
RDW: 16 % — ABNORMAL HIGH (ref 11.5–15.5)
WBC: 12.2 10*3/uL — ABNORMAL HIGH (ref 4.0–10.5)

## 2017-09-25 MED ORDER — METHYLPREDNISOLONE SODIUM SUCC 40 MG IJ SOLR
30.0000 mg | Freq: Two times a day (BID) | INTRAMUSCULAR | Status: DC
  Administered 2017-09-25 – 2017-09-26 (×2): 30 mg via INTRAVENOUS
  Filled 2017-09-25 (×2): qty 1

## 2017-09-25 MED ORDER — INSULIN ASPART 100 UNIT/ML ~~LOC~~ SOLN
8.0000 [IU] | Freq: Once | SUBCUTANEOUS | Status: AC
  Administered 2017-09-25: 8 [IU] via SUBCUTANEOUS

## 2017-09-25 MED ORDER — FUROSEMIDE 40 MG PO TABS
40.0000 mg | ORAL_TABLET | Freq: Every day | ORAL | Status: DC
  Administered 2017-09-26 – 2017-09-30 (×5): 40 mg via ORAL
  Filled 2017-09-25 (×5): qty 1

## 2017-09-25 NOTE — Progress Notes (Signed)
PROGRESS NOTE    Virginia Huber  KDX:[id number] DOB: 1946/04/12 DOA: 09/23/2017 PCP: Sharilyn Sites, MD    Brief Narrative:  72 y/o female admitted to the hospital with shortness of breath and respiratory failure. Found to have acute on chronic diastolic chf and likely acute bronchitis. Started on IV diuresis, neb treatments and IV steroids. Discharge home once respiratory status has improved.  Assessment & Plan:   Principal Problem:   Acute respiratory failure with hypoxia (HCC) Active Problems:   Essential hypertension   Hypothyroidism   DM type 2 causing vascular disease (HCC)   Acute diastolic CHF (congestive heart failure), NYHA class 3 (HCC)   CKD (chronic kidney disease) stage 3, GFR 30-59 ml/min (HCC)   Fever   1. Acute respiratory failure with hypoxia.  Patient presented with oxygen saturations in the 80s.  Supplemental oxygen has been applied.  Possibly combination of CHF exacerbation with acute bronchitis.  Wean off oxygen as tolerated. 2. Acute on chronic diastolic congestive heart failure.  BNP elevated at 841 on admission.  Recent echocardiogram from 07/2017 showed grade 2 diastolic dysfunction.  She was given intravenous Lasix.  Losartan on hold, with bump in creatinine will DC IV lasix and start back on home oral lasix dose for 4/7.  She reports good diuresis so far.  3. Acute bronchitis.  Respiratory viral panel positive for coronavirus.  Continue supportive care.  4. Atrial fibrillation.  Rate controlled on amiodarone, diltiazem and Coreg.  Anticoagulation with apixaban 5. Hypertension.  Blood pressure running on the lower side.  Currently on Coreg, diltiazem.  Hydralazine and losartan currently on hold since patient is being actively diuresed. 6. Diabetes.  Continue on 70/30 insulin.  Metformin currently on hold.  Monitor blood sugars especially since being started on steroids. 7. Hypothyroidism.  TSH elevated as it was in 07/2017.  Free T4 is also in similar range.   Patient reports compliance of medications.  We will increase Synthroid dose from 158mcg to 267mcg daily. She needs to follow up with endocrinologist after discharge.  8. Chronic kidney disease stage 3. Creatinine is currently at baseline. Continue to monitor with diuresis  DVT prophylaxis: Apixaban Code Status: full code Family Communication: Discussed with husband at the bedside Disposition Plan: Discharge home hopefully tomorrow if continues to improve   Antimicrobials:   Levofloxacin 4/4>    Subjective: Pt reporting significant improvement, less coughing, less wheezing.   Objective: Vitals:   09/24/17 2100 09/25/17 0117 09/25/17 0500 09/25/17 0722  BP: (!) 117/45  115/89   Pulse: 60  60   Resp: 19  18   Temp: 98.5 F (36.9 C)  97.8 F (36.6 C)   TempSrc: Oral  Oral   SpO2: 95% 95% 95% 91%  Weight:   74.8 kg (164 lb 14.5 oz)   Height:        Intake/Output Summary (Last 24 hours) at 09/25/2017 1138 Last data filed at 09/25/2017 1130 Gross per 24 hour  Intake 243 ml  Output 1000 ml  Net -757 ml   Filed Weights   09/23/17 2257 09/24/17 0500 09/25/17 0500  Weight: 74.1 kg (163 lb 6.4 oz) 73.5 kg (162 lb 1.6 oz) 74.8 kg (164 lb 14.5 oz)   Examination:  General exam: Appears calm and comfortable  Respiratory system: bilateral wheezes, crackles at right base. Respiratory effort normal. Cardiovascular system: S1 & S2 heard, RRR. No JVD, murmurs, rubs, gallops or clicks. 1+ pedal edema. Gastrointestinal system: Abdomen is nondistended, soft and nontender. No  organomegaly or masses felt. Normal bowel sounds heard. Central nervous system: Alert and oriented. No focal neurological deficits. Extremities: Symmetric 5 x 5 power. Skin: No rashes, lesions or ulcers Psychiatry: Judgement and insight appear normal. Mood & affect appropriate.   Data Reviewed: I have personally reviewed following labs and imaging studies  CBC: Recent Labs  Lab 09/23/17 1940 09/24/17 0007  09/25/17 0627  WBC 15.8* 14.7* 12.2*  NEUTROABS 14.1* 12.9*  --   HGB 9.7* 9.1* 8.9*  HCT 29.9* 28.2* 27.9*  MCV 92.3 92.5 92.7  PLT 309 302 235   Basic Metabolic Panel: Recent Labs  Lab 09/23/17 1940 09/24/17 0559 09/25/17 0627  NA 135 138 135  K 3.4* 4.0 4.3  CL 101 102 100*  CO2 20* 24 21*  GLUCOSE 174* 155* 262*  BUN 26* 28* 38*  CREATININE 1.30* 1.35* 1.44*  CALCIUM 9.1 8.4* 8.7*   GFR: Estimated Creatinine Clearance: 34.2 mL/min (A) (by C-G formula based on SCr of 1.44 mg/dL (H)). Liver Function Tests: Recent Labs  Lab 09/23/17 1940  AST 35  ALT 65*  ALKPHOS 64  BILITOT 1.6*  PROT 6.7  ALBUMIN 3.3*   No results for input(s): LIPASE, AMYLASE in the last 168 hours. No results for input(s): AMMONIA in the last 168 hours. Coagulation Profile: No results for input(s): INR, PROTIME in the last 168 hours. Cardiac Enzymes: Recent Labs  Lab 09/24/17 0007 09/24/17 0559  TROPONINI 0.03* 0.03*   BNP (last 3 results) No results for input(s): PROBNP in the last 8760 hours. HbA1C: No results for input(s): HGBA1C in the last 72 hours. CBG: Recent Labs  Lab 09/24/17 1123 09/24/17 1705 09/24/17 2039 09/25/17 0735 09/25/17 1057  GLUCAP 124* 125* 178* 255* 351*   Lipid Profile: No results for input(s): CHOL, HDL, LDLCALC, TRIG, CHOLHDL, LDLDIRECT in the last 72 hours. Thyroid Function Tests: Recent Labs    09/23/17 2210 09/23/17 2212  TSH  --  11.574*  FREET4 1.80*  --    Anemia Panel: No results for input(s): VITAMINB12, FOLATE, FERRITIN, TIBC, IRON, RETICCTPCT in the last 72 hours. Sepsis Labs: Recent Labs  Lab 09/23/17 1947 09/23/17 2232  LATICACIDVEN 0.95 0.82    Recent Results (from the past 240 hour(s))  Culture, blood (routine x 2)     Status: None (Preliminary result)   Collection Time: 09/23/17  7:33 PM  Result Value Ref Range Status   Specimen Description LEFT ANTECUBITAL DRAWN BY RN  Final   Special Requests   Final    BOTTLES DRAWN  AEROBIC AND ANAEROBIC Blood Culture adequate volume   Culture   Final    NO GROWTH < 12 HOURS Performed at [facility], [address], [place], [place] [address]    Report Status PENDING  Incomplete  Culture, blood (routine x 2)     Status: None (Preliminary result)   Collection Time: 09/23/17  7:41 PM  Result Value Ref Range Status   Specimen Description RIGHT ANTECUBITAL  Final   Special Requests   Final    BOTTLES DRAWN AEROBIC AND ANAEROBIC Blood Culture adequate volume   Culture   Final    NO GROWTH < 12 HOURS Performed at [facility], [address] [place], [place] [address]    Report Status PENDING  Incomplete  Respiratory Panel by PCR     Status: Abnormal   Collection Time: 09/23/17 11:20 PM  Result Value Ref Range Status   Adenovirus NOT DETECTED NOT DETECTED Final   Coronavirus 229E  NOT DETECTED NOT DETECTED Final   Coronavirus HKU1 NOT DETECTED NOT DETECTED Final   Coronavirus NL63 NOT DETECTED NOT DETECTED Final   Coronavirus OC43 DETECTED (A) NOT DETECTED Final   Metapneumovirus NOT DETECTED NOT DETECTED Final   Rhinovirus / Enterovirus NOT DETECTED NOT DETECTED Final   Influenza A NOT DETECTED NOT DETECTED Final   Influenza B NOT DETECTED NOT DETECTED Final   Parainfluenza Virus 1 NOT DETECTED NOT DETECTED Final   Parainfluenza Virus 2 NOT DETECTED NOT DETECTED Final   Parainfluenza Virus 3 NOT DETECTED NOT DETECTED Final   Parainfluenza Virus 4 NOT DETECTED NOT DETECTED Final   Respiratory Syncytial Virus NOT DETECTED NOT DETECTED Final   Bordetella pertussis NOT DETECTED NOT DETECTED Final   Chlamydophila pneumoniae NOT DETECTED NOT DETECTED Final   Mycoplasma pneumoniae NOT DETECTED NOT DETECTED Final    Comment: Performed at [facility], [place] [address], [place], [place] [address]    Radiology Studies: Dg Chest 2 View  Result Date: 09/23/2017 CLINICAL DATA:  Chest pain short of breath EXAM: CHEST - 2 VIEW COMPARISON:  09/21/2017, 06/26/2016  FINDINGS: Left-sided pacing device as before. Small bilateral pleural effusions. Cardiomegaly with worsened vascular congestion and development of moderate interstitial opacity consistent with edema. Aortic atherosclerosis. No pneumothorax. IMPRESSION: 1. Cardiomegaly with development of vascular congestion and moderate diffuse interstitial opacity consistent with pulmonary edema. Small pleural effusions. Electronically Signed   By: Donavan Foil M.D.   On: 09/23/2017 20:49   Scheduled Meds: . amiodarone  200 mg Oral Daily  . apixaban  5 mg Oral BID  . aspirin EC  81 mg Oral Daily  . atorvastatin  80 mg Oral q1800  . carvedilol  25 mg Oral BID WC  . clopidogrel  75 mg Oral Daily  . diltiazem  360 mg Oral Daily  . feeding supplement (ENSURE ENLIVE)  237 mL Oral Q24H  . [START ON 09/26/2017] furosemide  40 mg Oral Daily  . insulin aspart  0-9 Units Subcutaneous TID WC  . insulin aspart protamine- aspart  25 Units Subcutaneous BID WC  . ipratropium-albuterol  3 mL Nebulization Q6H  . levothyroxine  200 mcg Oral QAC breakfast  . methylPREDNISolone (SOLU-MEDROL) injection  30 mg Intravenous Q12H  . potassium chloride SA  20 mEq Oral Daily  . rOPINIRole  0.5 mg Oral QHS  . sodium chloride flush  3 mL Intravenous Q12H   Continuous Infusions: . sodium chloride    . levofloxacin (LEVAQUIN) IV       LOS: 2 days   Time spent: 41mins  Irwin Brakeman, MD [facility] Pager [phone]  If 7PM-7AM, please contact night-coverage [email or web address] Password [facility] 09/25/2017, 11:38 AM

## 2017-09-25 NOTE — Progress Notes (Signed)
SATURATION QUALIFICATIONS: (This note is used to comply with regulatory documentation for home oxygen)  Patient Saturations on Room Air at Rest = 95%  Patient Saturations on Room Air while Ambulating = 87%  Patient Saturations on 2 Liters of oxygen while Ambulating = 95%  Please briefly explain why patient needs home oxygen:

## 2017-09-26 LAB — GLUCOSE, CAPILLARY
GLUCOSE-CAPILLARY: 343 mg/dL — AB (ref 65–99)
GLUCOSE-CAPILLARY: 206 mg/dL — AB (ref 65–99)
Glucose-Capillary: 280 mg/dL — ABNORMAL HIGH (ref 65–99)
Glucose-Capillary: 346 mg/dL — ABNORMAL HIGH (ref 65–99)
Glucose-Capillary: 255 mg/dL — ABNORMAL HIGH (ref 65–99)

## 2017-09-26 LAB — BASIC METABOLIC PANEL
Anion gap: 12 (ref 5–15)
BUN: 41 mg/dL — ABNORMAL HIGH (ref 6–20)
CALCIUM: 9.2 mg/dL (ref 8.9–10.3)
CO2: 22 mmol/L (ref 22–32)
CREATININE: 1.27 mg/dL — AB (ref 0.44–1.00)
Chloride: 97 mmol/L — ABNORMAL LOW (ref 101–111)
GFR calc Af Amer: 48 mL/min — ABNORMAL LOW (ref >60)
GFR, EST NON AFRICAN AMERICAN: 41 mL/min — AB (ref >60)
GLUCOSE: 269 mg/dL — AB (ref 65–99)
Potassium: 4.6 mmol/L (ref 3.5–5.1)
Sodium: 131 mmol/L — ABNORMAL LOW (ref 135–145)

## 2017-09-26 MED ORDER — METHYLPREDNISOLONE SODIUM SUCC 40 MG IJ SOLR
40.0000 mg | Freq: Two times a day (BID) | INTRAMUSCULAR | Status: DC
Start: 2017-09-26 — End: 2017-09-29
  Administered 2017-09-26 – 2017-09-29 (×6): 40 mg via INTRAVENOUS
  Filled 2017-09-26 (×6): qty 1

## 2017-09-26 MED ORDER — LORAZEPAM 0.5 MG PO TABS
0.5000 mg | ORAL_TABLET | ORAL | Status: DC | PRN
  Administered 2017-09-26 – 2017-09-29 (×8): 0.5 mg via ORAL
  Filled 2017-09-26 (×8): qty 1

## 2017-09-26 MED ORDER — TRAZODONE HCL 50 MG PO TABS: 50.0000 mg | ORAL_TABLET | Freq: Once | ORAL | Status: DC

## 2017-09-26 MED ORDER — INSULIN ASPART PROT & ASPART (70-30 MIX) 100 UNIT/ML ~~LOC~~ SUSP
30.0000 [IU] | Freq: Two times a day (BID) | SUBCUTANEOUS | Status: DC
  Administered 2017-09-26 – 2017-09-27 (×3): 30 [IU] via SUBCUTANEOUS
  Filled 2017-09-26: qty 10

## 2017-09-26 MED ORDER — INSULIN ASPART PROT & ASPART (70-30 MIX) 100 UNIT/ML ~~LOC~~ SUSP: 30.0000 [IU] | Freq: Two times a day (BID) | SUBCUTANEOUS | Status: DC

## 2017-09-26 MED ORDER — INSULIN ASPART 100 UNIT/ML ~~LOC~~ SOLN
0.0000 [IU] | Freq: Every day | SUBCUTANEOUS | Status: DC
  Administered 2017-09-26: 2 [IU] via SUBCUTANEOUS
  Administered 2017-09-27: 4 [IU] via SUBCUTANEOUS
  Administered 2017-09-28: 3 [IU] via SUBCUTANEOUS
  Administered 2017-09-29: 4 [IU] via SUBCUTANEOUS

## 2017-09-26 MED ORDER — INSULIN ASPART 100 UNIT/ML ~~LOC~~ SOLN
0.0000 [IU] | Freq: Three times a day (TID) | SUBCUTANEOUS | Status: DC
  Administered 2017-09-26 – 2017-09-27 (×3): 11 [IU] via SUBCUTANEOUS
  Administered 2017-09-27: 15 [IU] via SUBCUTANEOUS
  Administered 2017-09-27: 11 [IU] via SUBCUTANEOUS
  Administered 2017-09-28: 20 [IU] via SUBCUTANEOUS
  Administered 2017-09-28: 15 [IU] via SUBCUTANEOUS
  Administered 2017-09-28 – 2017-09-29 (×3): 20 [IU] via SUBCUTANEOUS
  Administered 2017-09-29 – 2017-09-30 (×2): 15 [IU] via SUBCUTANEOUS
  Administered 2017-09-30: 4 [IU] via SUBCUTANEOUS

## 2017-09-26 NOTE — Progress Notes (Signed)
PROGRESS NOTE    Virginia Huber  YFV:[id number] DOB: 09-22-1945 DOA: 09/23/2017 PCP: Sharilyn Sites, MD    Brief Narrative:  72 y/o female admitted to the hospital with shortness of breath and respiratory failure. Found to have acute on chronic diastolic chf and likely acute bronchitis. Started on IV diuresis, neb treatments and IV steroids. Discharge home once respiratory status has improved.  Assessment & Plan:   Principal Problem:   Acute respiratory failure with hypoxia (HCC) Active Problems:   Essential hypertension   Hypothyroidism   DM type 2 causing vascular disease (HCC)   Acute diastolic CHF (congestive heart failure), NYHA class 3 (HCC)   CKD (chronic kidney disease) stage 3, GFR 30-59 ml/min (HCC)   Fever   1. Acute respiratory failure with hypoxia.  Patient presented with oxygen saturations in the 80s.  Supplemental oxygen has been applied.  Suspect this was a combination of CHF exacerbation with acute bronchitis.  Wean off oxygen as tolerated. May need to go home on oxygen.  2. Acute on chronic diastolic congestive heart failure.  BNP elevated at 841 on admission.  Recent echocardiogram from 07/2017 showed grade 2 diastolic dysfunction.  She was given intravenous Lasix.  Losartan on hold, with bump in creatinine will DC IV lasix and start back on home oral lasix dose for 4/7.  She reports good diuresis so far. (-1.8L) 3. Acute bronchitis.  Respiratory viral panel positive for coronavirus.  Continue supportive care.  She continues to wheeze and cough and will need ongoing hospital care with nebs, IV steroids, antibiotics, oxygen.  4. Generalized anxiety disorder - exacerbated by steroids, added lorazepam as needed.  5. Atrial fibrillation.  Rate controlled on amiodarone, diltiazem and Coreg.  Anticoagulation with apixaban 6. Hypertension.  Blood pressure running on the lower side.  Currently on Coreg, diltiazem.  Hydralazine and losartan currently on hold since patient is  being actively diuresed. 7. Type 2 Diabetes with hyperglycemia - steroid induced.  Continue on 70/30 insulin.  Increased dose to 30 units.  Metformin currently on hold.  Monitor closely. 8. Hypothyroidism.  TSH elevated as it was in 07/2017.  Free T4 is also in similar range.  Patient reports compliance of medications.  She needs to follow up with endocrinologist after discharge.  9. Chronic kidney disease stage 3. Creatinine is currently at baseline. Continue to monitor with diuresis.   DVT prophylaxis: Apixaban Code Status: full code Family Communication: Discussed with husband at the bedside Disposition Plan: needs ongoing hospital care as noted above  Antimicrobials:   Levofloxacin 4/4>   Subjective: Pt had a bad night, she has been anxious, her wheezing has come back and she is SOB and not able to get off oxygen.  She had a panic attack last night.    Objective: Vitals:   09/25/17 2100 09/26/17 0110 09/26/17 0500 09/26/17 0742  BP: (!) 142/56  (!) 159/62   Pulse: 62  60   Resp: 18  17   Temp: 98.1 F (36.7 C)  98.2 F (36.8 C)   TempSrc: Oral  Oral   SpO2: 96% (!) 89% 97% (S) (!) 88%  Weight:   75.6 kg (166 lb 10.7 oz)   Height:        Intake/Output Summary (Last 24 hours) at 09/26/2017 1044 Last data filed at 09/26/2017 0300 Gross per 24 hour  Intake 393 ml  Output 1200 ml  Net -807 ml   Filed Weights   09/24/17 0500 09/25/17 0500 09/26/17 0500  Weight: 73.5 kg (162 lb 1.6 oz) 74.8 kg (164 lb 14.5 oz) 75.6 kg (166 lb 10.7 oz)   Examination:  General exam: Appears calm and comfortable  Respiratory system: diffuse wheezes bilateral. Respiratory effort normal. Cardiovascular system: S1 & S2 heard, RRR. No JVD, murmurs, rubs, gallops or clicks. 1+ pedal edema. Gastrointestinal system: Abdomen is nondistended, soft and nontender. No organomegaly or masses felt. Normal bowel sounds heard. Central nervous system: Alert and oriented. No focal neurological  deficits. Extremities: Symmetric 5 x 5 power. Skin: No rashes, lesions or ulcers Psychiatry: Judgement and insight appear normal. Mood & affect appropriate.   Data Reviewed: I have personally reviewed following labs and imaging studies  CBC: Recent Labs  Lab 09/23/17 1940 09/24/17 0007 09/25/17 0627  WBC 15.8* 14.7* 12.2*  NEUTROABS 14.1* 12.9*  --   HGB 9.7* 9.1* 8.9*  HCT 29.9* 28.2* 27.9*  MCV 92.3 92.5 92.7  PLT 309 302 751   Basic Metabolic Panel: Recent Labs  Lab 09/23/17 1940 09/24/17 0559 09/25/17 0627 09/26/17 0616  NA 135 138 135 131*  K 3.4* 4.0 4.3 4.6  CL 101 102 100* 97*  CO2 20* 24 21* 22  GLUCOSE 174* 155* 262* 269*  BUN 26* 28* 38* 41*  CREATININE 1.30* 1.35* 1.44* 1.27*  CALCIUM 9.1 8.4* 8.7* 9.2   GFR: Estimated Creatinine Clearance: 39 mL/min (A) (by C-G formula based on SCr of 1.27 mg/dL (H)). Liver Function Tests: Recent Labs  Lab 09/23/17 1940  AST 35  ALT 65*  ALKPHOS 64  BILITOT 1.6*  PROT 6.7  ALBUMIN 3.3*   No results for input(s): LIPASE, AMYLASE in the last 168 hours. No results for input(s): AMMONIA in the last 168 hours. Coagulation Profile: No results for input(s): INR, PROTIME in the last 168 hours. Cardiac Enzymes: Recent Labs  Lab 09/24/17 0007 09/24/17 0559  TROPONINI 0.03* 0.03*   BNP (last 3 results) No results for input(s): PROBNP in the last 8760 hours. HbA1C: No results for input(s): HGBA1C in the last 72 hours. CBG: Recent Labs  Lab 09/25/17 1342 09/25/17 1705 09/25/17 2115 09/26/17 0729 09/26/17 1043  GLUCAP 381* 320* 382* 280* 346*   Lipid Profile: No results for input(s): CHOL, HDL, LDLCALC, TRIG, CHOLHDL, LDLDIRECT in the last 72 hours. Thyroid Function Tests: Recent Labs    09/23/17 2210 09/23/17 2212  TSH  --  11.574*  FREET4 1.80*  --    Anemia Panel: No results for input(s): VITAMINB12, FOLATE, FERRITIN, TIBC, IRON, RETICCTPCT in the last 72 hours. Sepsis Labs: Recent Labs  Lab  09/23/17 1947 09/23/17 2232  LATICACIDVEN 0.95 0.82    Recent Results (from the past 240 hour(s))  Culture, blood (routine x 2)     Status: None (Preliminary result)   Collection Time: 09/23/17  7:33 PM  Result Value Ref Range Status   Specimen Description LEFT ANTECUBITAL DRAWN BY RN  Final   Special Requests   Final    BOTTLES DRAWN AEROBIC AND ANAEROBIC Blood Culture adequate volume   Culture   Final    NO GROWTH 3 DAYS Performed at [facility], [address] [place], [place] [address]    Report Status PENDING  Incomplete  Culture, blood (routine x 2)     Status: None (Preliminary result)   Collection Time: 09/23/17  7:41 PM  Result Value Ref Range Status   Specimen Description RIGHT ANTECUBITAL  Final   Special Requests   Final    BOTTLES DRAWN AEROBIC AND ANAEROBIC  Blood Culture adequate volume   Culture   Final    NO GROWTH 3 DAYS Performed at [facility], [address] [place], [place] [address]    Report Status PENDING  Incomplete  Respiratory Panel by PCR     Status: Abnormal   Collection Time: 09/23/17 11:20 PM  Result Value Ref Range Status   Adenovirus NOT DETECTED NOT DETECTED Final   Coronavirus 229E NOT DETECTED NOT DETECTED Final   Coronavirus HKU1 NOT DETECTED NOT DETECTED Final   Coronavirus NL63 NOT DETECTED NOT DETECTED Final   Coronavirus OC43 DETECTED (A) NOT DETECTED Final   Metapneumovirus NOT DETECTED NOT DETECTED Final   Rhinovirus / Enterovirus NOT DETECTED NOT DETECTED Final   Influenza A NOT DETECTED NOT DETECTED Final   Influenza B NOT DETECTED NOT DETECTED Final   Parainfluenza Virus 1 NOT DETECTED NOT DETECTED Final   Parainfluenza Virus 2 NOT DETECTED NOT DETECTED Final   Parainfluenza Virus 3 NOT DETECTED NOT DETECTED Final   Parainfluenza Virus 4 NOT DETECTED NOT DETECTED Final   Respiratory Syncytial Virus NOT DETECTED NOT DETECTED Final   Bordetella pertussis NOT DETECTED NOT DETECTED Final   Chlamydophila pneumoniae NOT DETECTED  NOT DETECTED Final   Mycoplasma pneumoniae NOT DETECTED NOT DETECTED Final    Comment: Performed at [facility], [place] [address] [place], [place] [address]    Radiology Studies: No results found. Scheduled Meds: . amiodarone  200 mg Oral Daily  . apixaban  5 mg Oral BID  . aspirin EC  81 mg Oral Daily  . atorvastatin  80 mg Oral q1800  . carvedilol  25 mg Oral BID WC  . clopidogrel  75 mg Oral Daily  . diltiazem  360 mg Oral Daily  . feeding supplement (ENSURE ENLIVE)  237 mL Oral Q24H  . furosemide  40 mg Oral Daily  . insulin aspart  0-20 Units Subcutaneous TID WC  . insulin aspart  0-5 Units Subcutaneous QHS  . insulin aspart protamine- aspart  30 Units Subcutaneous BID WC  . ipratropium-albuterol  3 mL Nebulization Q6H  . levothyroxine  200 mcg Oral QAC breakfast  . methylPREDNISolone (SOLU-MEDROL) injection  40 mg Intravenous Q12H  . potassium chloride SA  20 mEq Oral Daily  . rOPINIRole  0.5 mg Oral QHS  . sodium chloride flush  3 mL Intravenous Q12H   Continuous Infusions: . sodium chloride    . levofloxacin (LEVAQUIN) IV Stopped (09/26/17 0031)     LOS: 3 days   Time spent: 4mins  Irwin Brakeman, MD [facility] Pager [phone]  If 7PM-7AM, please contact night-coverage [email or web address] Password [facility] 09/26/2017, 10:44 AM

## 2017-09-27 LAB — GLUCOSE, CAPILLARY
GLUCOSE-CAPILLARY: 257 mg/dL — AB (ref 65–99)
GLUCOSE-CAPILLARY: 305 mg/dL — AB (ref 65–99)
Glucose-Capillary: 265 mg/dL — ABNORMAL HIGH (ref 65–99)
Glucose-Capillary: 286 mg/dL — ABNORMAL HIGH (ref 65–99)
Glucose-Capillary: 345 mg/dL — ABNORMAL HIGH (ref 65–99)

## 2017-09-27 MED ORDER — LEVOFLOXACIN 750 MG PO TABS
750.0000 mg | ORAL_TABLET | ORAL | Status: DC
  Administered 2017-09-27: 750 mg via ORAL
  Filled 2017-09-27: qty 1

## 2017-09-27 MED ORDER — INSULIN ASPART PROT & ASPART (70-30 MIX) 100 UNIT/ML ~~LOC~~ SUSP
35.0000 [IU] | Freq: Two times a day (BID) | SUBCUTANEOUS | Status: DC
  Administered 2017-09-27 – 2017-09-28 (×3): 35 [IU] via SUBCUTANEOUS
  Filled 2017-09-27: qty 10

## 2017-09-27 NOTE — Progress Notes (Signed)
PROGRESS NOTE    Virginia Huber  NOM:[id number] DOB: 24-May-1946 DOA: 09/23/2017 PCP: Sharilyn Sites, MD    Brief Narrative:  72 y/o female admitted to the hospital with shortness of breath and respiratory failure. Found to have acute on chronic diastolic chf and likely acute bronchitis. Started on IV diuresis, neb treatments and IV steroids. Discharge home once respiratory status has improved.  Assessment & Plan:   Principal Problem:   Acute respiratory failure with hypoxia (HCC) Active Problems:   Essential hypertension   Hypothyroidism   DM type 2 causing vascular disease (HCC)   Acute diastolic CHF (congestive heart failure), NYHA class 3 (HCC)   CKD (chronic kidney disease) stage 3, GFR 30-59 ml/min (HCC)   Fever   1. Acute respiratory failure with hypoxia.  Patient presented with oxygen saturations in the 80s.  Supplemental oxygen has been applied.  Suspect this was a combination of CHF exacerbation with acute bronchitis.  Wean off oxygen as tolerated. May need to go home on oxygen.  2. Acute on chronic diastolic congestive heart failure.  BNP elevated at 841 on admission.  Recent echocardiogram from 07/2017 showed grade 2 diastolic dysfunction.  She was initially given intravenous Lasix.  Losartan on hold, with bump in creatinine will DC IV lasix and start back on home oral lasix dose for 4/7.  She reports good diuresis so far. (-2.6L) 3. Acute bronchitis.  Respiratory viral panel positive for coronavirus.  Continue supportive care.  She continues to wheeze and cough and will need ongoing hospital care with nebs, IV steroids, antibiotics, oxygen.  4. Generalized anxiety disorder with panic attacks- exacerbated by steroids, added lorazepam as needed with good improvement.  5. Atrial fibrillation.  Rate controlled on amiodarone, diltiazem and Coreg.  Anticoagulation with apixaban.   6. Hypertension.  Continue Coreg, diltiazem.  Hydralazine and losartan currently on hold since patient  is being actively diuresed. 7. Type 2 Diabetes with hyperglycemia - steroid induced.  Continue on 70/30 insulin.  Increased dose to 35 units.  Metformin currently on hold.  Monitor closely. 8. Hypothyroidism.  TSH elevated as it was in 07/2017.  Free T4 is also in similar range.  Patient reports compliance of medications.  She needs to follow up with endocrinologist after discharge.  9. Chronic kidney disease stage 3. Creatinine is currently at baseline. Continue to monitor with diuresis.   DVT prophylaxis: Apixaban Code Status: full code Family Communication: Discussed with husband at the bedside Disposition Plan: needs ongoing hospital care as noted above  Antimicrobials:   Levofloxacin 4/4>   Subjective: Pt reporting that she is feeling much better today, she was able to sleep well yesterday and the anxiety is much better.    Objective: Vitals:   09/26/17 2159 09/27/17 0221 09/27/17 0500 09/27/17 0557  BP: (!) 144/68   (!) 142/60  Pulse: 67   (!) 59  Resp: 18   19  Temp: 97.9 F (36.6 C)   97.9 F (36.6 C)  TempSrc: Oral   Oral  SpO2: 98% 92%  98%  Weight:   76.8 kg (169 lb 4.8 oz)   Height:        Intake/Output Summary (Last 24 hours) at 09/27/2017 0901 Last data filed at 09/26/2017 2300 Gross per 24 hour  Intake 340 ml  Output 1400 ml  Net -1060 ml   Filed Weights   09/25/17 0500 09/26/17 0500 09/27/17 0500  Weight: 74.8 kg (164 lb 14.5 oz) 75.6 kg (166 lb 10.7 oz) 76.8  kg (169 lb 4.8 oz)   Examination:  General exam: Appears calm and comfortable  Respiratory system: diffuse wheezes bilateral. Respiratory effort normal. Cardiovascular system: S1 & S2 heard, RRR. No JVD, murmurs, rubs, gallops or clicks. 1+ pedal edema. Gastrointestinal system: Abdomen is nondistended, soft and nontender. No organomegaly or masses felt. Normal bowel sounds heard. Central nervous system: Alert and oriented. No focal neurological deficits. Extremities: Symmetric 5 x 5 power. Skin: No  rashes, lesions or ulcers Psychiatry: Judgement and insight appear normal. Mood & affect appropriate.   Data Reviewed: I have personally reviewed following labs and imaging studies  CBC: Recent Labs  Lab 09/23/17 1940 09/24/17 0007 09/25/17 0627  WBC 15.8* 14.7* 12.2*  NEUTROABS 14.1* 12.9*  --   HGB 9.7* 9.1* 8.9*  HCT 29.9* 28.2* 27.9*  MCV 92.3 92.5 92.7  PLT 309 302 740   Basic Metabolic Panel: Recent Labs  Lab 09/23/17 1940 09/24/17 0559 09/25/17 0627 09/26/17 0616  NA 135 138 135 131*  K 3.4* 4.0 4.3 4.6  CL 101 102 100* 97*  CO2 20* 24 21* 22  GLUCOSE 174* 155* 262* 269*  BUN 26* 28* 38* 41*  CREATININE 1.30* 1.35* 1.44* 1.27*  CALCIUM 9.1 8.4* 8.7* 9.2   GFR: Estimated Creatinine Clearance: 39.3 mL/min (A) (by C-G formula based on SCr of 1.27 mg/dL (H)). Liver Function Tests: Recent Labs  Lab 09/23/17 1940  AST 35  ALT 65*  ALKPHOS 64  BILITOT 1.6*  PROT 6.7  ALBUMIN 3.3*   No results for input(s): LIPASE, AMYLASE in the last 168 hours. No results for input(s): AMMONIA in the last 168 hours. Coagulation Profile: No results for input(s): INR, PROTIME in the last 168 hours. Cardiac Enzymes: Recent Labs  Lab 09/24/17 0007 09/24/17 0559  TROPONINI 0.03* 0.03*   BNP (last 3 results) No results for input(s): PROBNP in the last 8760 hours. HbA1C: No results for input(s): HGBA1C in the last 72 hours. CBG: Recent Labs  Lab 09/26/17 1122 09/26/17 1636 09/26/17 2201 09/27/17 0515 09/27/17 0740  GLUCAP 343* 255* 206* 265* 286*   Lipid Profile: No results for input(s): CHOL, HDL, LDLCALC, TRIG, CHOLHDL, LDLDIRECT in the last 72 hours. Thyroid Function Tests: No results for input(s): TSH, T4TOTAL, FREET4, T3FREE, THYROIDAB in the last 72 hours. Anemia Panel: No results for input(s): VITAMINB12, FOLATE, FERRITIN, TIBC, IRON, RETICCTPCT in the last 72 hours. Sepsis Labs: Recent Labs  Lab 09/23/17 1947 09/23/17 2232  LATICACIDVEN 0.95 0.82     Recent Results (from the past 240 hour(s))  Culture, blood (routine x 2)     Status: None (Preliminary result)   Collection Time: 09/23/17  7:33 PM  Result Value Ref Range Status   Specimen Description LEFT ANTECUBITAL DRAWN BY RN  Final   Special Requests   Final    BOTTLES DRAWN AEROBIC AND ANAEROBIC Blood Culture adequate volume   Culture   Final    NO GROWTH 4 DAYS Performed at [facility], [address], [place], [place] [address]    Report Status PENDING  Incomplete  Culture, blood (routine x 2)     Status: None (Preliminary result)   Collection Time: 09/23/17  7:41 PM  Result Value Ref Range Status   Specimen Description RIGHT ANTECUBITAL  Final   Special Requests   Final    BOTTLES DRAWN AEROBIC AND ANAEROBIC Blood Culture adequate volume   Culture   Final    NO GROWTH 4 DAYS Performed at [facility], [address] [place], [place] [address]    Report Status PENDING  Incomplete  Respiratory Panel by PCR     Status: Abnormal   Collection Time: 09/23/17 11:20 PM  Result Value Ref Range Status   Adenovirus NOT DETECTED NOT DETECTED Final   Coronavirus 229E NOT DETECTED NOT DETECTED Final   Coronavirus HKU1 NOT DETECTED NOT DETECTED Final   Coronavirus NL63 NOT DETECTED NOT DETECTED Final   Coronavirus OC43 DETECTED (A) NOT DETECTED Final   Metapneumovirus NOT DETECTED NOT DETECTED Final   Rhinovirus / Enterovirus NOT DETECTED NOT DETECTED Final   Influenza A NOT DETECTED NOT DETECTED Final   Influenza B NOT DETECTED NOT DETECTED Final   Parainfluenza Virus 1 NOT DETECTED NOT DETECTED Final   Parainfluenza Virus 2 NOT DETECTED NOT DETECTED Final   Parainfluenza Virus 3 NOT DETECTED NOT DETECTED Final   Parainfluenza Virus 4 NOT DETECTED NOT DETECTED Final   Respiratory Syncytial Virus NOT DETECTED NOT DETECTED Final   Bordetella pertussis NOT DETECTED NOT DETECTED Final   Chlamydophila pneumoniae NOT DETECTED NOT DETECTED Final   Mycoplasma pneumoniae NOT  DETECTED NOT DETECTED Final    Comment: Performed at [facility], [place] [address], [place], [place] [address]    Radiology Studies: No results found. Scheduled Meds: . amiodarone  200 mg Oral Daily  . apixaban  5 mg Oral BID  . aspirin EC  81 mg Oral Daily  . atorvastatin  80 mg Oral q1800  . carvedilol  25 mg Oral BID WC  . clopidogrel  75 mg Oral Daily  . diltiazem  360 mg Oral Daily  . feeding supplement (ENSURE ENLIVE)  237 mL Oral Q24H  . furosemide  40 mg Oral Daily  . insulin aspart  0-20 Units Subcutaneous TID WC  . insulin aspart  0-5 Units Subcutaneous QHS  . insulin aspart protamine- aspart  35 Units Subcutaneous BID WC  . ipratropium-albuterol  3 mL Nebulization Q6H  . levothyroxine  200 mcg Oral QAC breakfast  . methylPREDNISolone (SOLU-MEDROL) injection  40 mg Intravenous Q12H  . potassium chloride SA  20 mEq Oral Daily  . rOPINIRole  0.5 mg Oral QHS  . sodium chloride flush  3 mL Intravenous Q12H   Continuous Infusions: . sodium chloride    . levofloxacin (LEVAQUIN) IV Stopped (09/26/17 0031)     LOS: 4 days   Time spent: 27mins  Irwin Brakeman, MD [facility] Pager [phone]  If 7PM-7AM, please contact night-coverage [email or web address] Password [email or web address] 09/27/2017, 9:01 AM

## 2017-09-27 NOTE — Progress Notes (Signed)
Pharmacy Antibiotic Note  Virginia Huber is a 72 y.o. female admitted on 09/23/2017 with CAP.  Pharmacy has been consulted for levaquin dosing. Patient improving, afebrile. Still wheezing. D/W Dr. Wynetta Emery, continue IV steroids, transition IV abx to PO Plan: Levaquin 750 mg PO q48 hours Monitor labs, c/s, and patient improvement  Height: 5\' 3"  (160 cm) Weight: 169 lb 4.8 oz (76.8 kg) IBW/kg (Calculated) : 52.4  Temp (24hrs), Avg:97.9 F (36.6 C), Min:97.9 F (36.6 C), Max:97.9 F (36.6 C)  Recent Labs  Lab 09/23/17 1940 09/23/17 1947 09/23/17 2232 09/24/17 0007 09/24/17 0559 09/25/17 0627 09/26/17 0616  WBC 15.8*  --   --  14.7*  --  12.2*  --   CREATININE 1.30*  --   --   --  1.35* 1.44* 1.27*  LATICACIDVEN  --  0.95 0.82  --   --   --   --     Estimated Creatinine Clearance: 39.3 mL/min (A) (by C-G formula based on SCr of 1.27 mg/dL (H)).    Allergies  Allergen Reactions  . Penicillins Hives    Has patient had a PCN reaction causing immediate rash, facial/tongue/throat swelling, SOB or lightheadedness with hypotension: Yes Has patient had a PCN reaction causing severe rash involving mucus membranes or skin necrosis: No Has patient had a PCN reaction that required hospitalization No Has patient had a PCN reaction occurring within the last 10 years: No If all of the above answers are "NO", then may proceed with Cephalosporin use.  [name] Percocet [Oxycodone-Acetaminophen] Nausea And Vomiting    Antimicrobials this admission: Levaquin 4/4 >>    Dose adjustments this admission: Levaquin to q48 hours for CrCl of 37  Microbiology results: 4/4 BCx: ngtd  Thank you for allowing pharmacy to be a part of this patient's care.  Isac Sarna, BS Pharm D, BCPS Clinical Pharmacist Pager [phone] 09/27/2017 10:10 AM

## 2017-09-27 NOTE — Care Management Important Message (Signed)
Important Message  Patient Details  Name: Virginia Huber MRN: [id number] Date of Birth: August 06, 1945   Medicare Important Message Given:  Yes    Shelda Altes 09/27/2017, 12:44 PM

## 2017-09-27 NOTE — Progress Notes (Addendum)
Physical Therapy Treatment Patient Details Name: Virginia Huber MRN: [id number] DOB: 28-Aug-1945 Today's Date: 09/27/2017    History of Present Illness BRAE SCHAAFSMA is a 72 y.o. female with medical history significant of DM, afib, PAD, CAD, hypothyroidism, HLD, and HTN presenting with fever and SOB.  She went to Dr. [name] Tuesday for a checkup and noticed SOB - he thought it was a URI and he gave her a steroid injection and a Z-pack and a breathing treatment.  It has continued to progress since and today she couldn't really breathe at all.  SOB is constant this week, not related to exertion.  Cough productive of "crud" - gray-looking.  Fever to 101.4 tonight, this was the first.  She has 2 more doses of azithromycin left to complete the course.  She was unable to lie flat at home.  She does feel better with the O2.  +LE edema.  She has not been sleeping because she couldn't breathe.  Mild chest discomfort that she attributes to being so short of breath.    PT Comments    Patient presents seated in chair and agreeable for therapy.  Patient able to ambulate without using RW, but frequently has to lean on walls, side rails for support to maintain balance, (see below).  Patient on 2 LPM O2 during gait training with O2 saturation between 91-94%.  Patient able to continue sitting up in chair after therapy.  Patient will benefit from continued physical therapy in hospital and recommended venue below to increase strength, balance, endurance for safe ADLs and gait.   Follow Up Recommendations  Home health PT;Supervision for mobility/OOB     Equipment Recommendations  Rolling walker with 5" wheels    Recommendations for Other Services       Precautions / Restrictions Precautions Precautions: Fall Required Braces or Orthoses: Other Brace/Splint Other Brace/Splint: CAM Boot right foot Restrictions Weight Bearing Restrictions: No    Mobility  Bed Mobility               General bed  mobility comments: Patient presents up in chair (assisted by nursing staff)  Transfers Overall transfer level: Modified independent Equipment used: None Transfers: Sit to/from [facility] Sit to Stand: Modified independent (Device/Increase time) Stand pivot transfers: Modified independent (Device/Increase time)       General transfer comment: leans on near by objects for support without loss of balance  Ambulation/Gait Ambulation/Gait assistance: Supervision Ambulation Distance (Feet): 120 Feet Assistive device: None;1 person hand held assist Gait Pattern/deviations: Decreased step length - right;Decreased step length - left;Decreased stride length;Drifts right/left   Gait velocity interpretation: Below normal speed for age/gender General Gait Details: frequent leaning on walls, side rails in hallway for support, able to ambulate up to 15-20 feet without support, no loss of balance, on 2 LPM O2 with saturation between 91-94%   Stairs            Wheelchair Mobility    Modified Rankin (Stroke Patients Only)       Balance Overall balance assessment: Needs assistance Sitting-balance support: Feet supported;No upper extremity supported Sitting balance-Leahy Scale: Good     Standing balance support: No upper extremity supported;During functional activity Standing balance-Leahy Scale: Fair Standing balance comment: frequent lean on walls/sider rails for support                            Cognition Arousal/Alertness: Awake/alert Behavior During Therapy: [facility] for tasks assessed/performed  Overall Cognitive Status: Within Functional Limits for tasks assessed                                        Exercises General Exercises - Lower Extremity Long Arc Quad: Seated;AROM;Strengthening;Both;10 reps Hip Flexion/Marching: Seated;AROM;Strengthening;Both;10 reps Toe Raises: Seated;AROM;Strengthening;Both;10 reps Heel Raises:  Seated;AROM;Strengthening;Both;10 reps    General Comments        Pertinent Vitals/Pain Pain Assessment: Faces Faces Pain Scale: Hurts a little bit Pain Location: right foot when doing heel raises Pain Descriptors / Indicators: Discomfort    Home Living                      Prior Function            PT Goals (current goals can now be found in the care plan section) Acute Rehab PT Goals Patient Stated Goal: return home PT Goal Formulation: With patient Time For Goal Achievement: 10/01/17 Potential to Achieve Goals: Good Progress towards PT goals: Progressing toward goals    Frequency    Min 5X/week      PT Plan Current plan remains appropriate    Co-evaluation              AM-PAC PT "6 Clicks" Daily Activity  Outcome Measure  Difficulty turning over in bed (including adjusting bedclothes, sheets and blankets)?: None Difficulty moving from lying on back to sitting on the side of the bed? : None Difficulty sitting down on and standing up from a chair with arms (e.g., wheelchair, bedside commode, etc,.)?: None Help needed moving to and from a bed to chair (including a wheelchair)?: None Help needed walking in hospital room?: A Little Help needed climbing 3-5 steps with a railing? : A Little 6 Click Score: 22    End of Session   Activity Tolerance: Patient tolerated treatment well;Patient limited by fatigue Patient left: in chair;with call bell/phone within reach;with chair alarm set Nurse Communication: Mobility status PT Visit Diagnosis: Unsteadiness on feet (R26.81);Other abnormalities of gait and mobility (R26.89);Muscle weakness (generalized) (M62.81)     Time: 1450-1520 PT Time Calculation (min) (ACUTE ONLY): 30 min  Charges:  $Gait Training: 8-22 mins $Therapeutic Exercise: 8-22 mins                    G Codes:       3:33 PM, 10-18-2017 Lonell Grandchild, MPT Physical Therapist with [facility] [phone]  office [phone] mobile phone

## 2017-09-28 ENCOUNTER — Inpatient Hospital Stay (HOSPITAL_COMMUNITY): Payer: PPO

## 2017-09-28 LAB — BASIC METABOLIC PANEL
Anion gap: 12 (ref 5–15)
BUN: 41 mg/dL — ABNORMAL HIGH (ref 6–20)
CHLORIDE: 98 mmol/L — AB (ref 101–111)
CO2: 24 mmol/L (ref 22–32)
Calcium: 8.7 mg/dL — ABNORMAL LOW (ref 8.9–10.3)
Creatinine, Ser: 1.39 mg/dL — ABNORMAL HIGH (ref 0.44–1.00)
GFR calc non Af Amer: 37 mL/min — ABNORMAL LOW (ref >60)
GFR, EST AFRICAN AMERICAN: 43 mL/min — AB (ref >60)
GLUCOSE: 340 mg/dL — AB (ref 65–99)
Potassium: 4.8 mmol/L (ref 3.5–5.1)
Sodium: 134 mmol/L — ABNORMAL LOW (ref 135–145)

## 2017-09-28 LAB — CULTURE, BLOOD (ROUTINE X 2)
Culture: NO GROWTH
Culture: NO GROWTH
SPECIAL REQUESTS: ADEQUATE
Special Requests: ADEQUATE

## 2017-09-28 LAB — GLUCOSE, CAPILLARY
GLUCOSE-CAPILLARY: 412 mg/dL — AB (ref 65–99)
Glucose-Capillary: 327 mg/dL — ABNORMAL HIGH (ref 65–99)
Glucose-Capillary: 323 mg/dL — ABNORMAL HIGH (ref 65–99)
Glucose-Capillary: 436 mg/dL — ABNORMAL HIGH (ref 65–99)
Glucose-Capillary: 354 mg/dL — ABNORMAL HIGH (ref 65–99)
Glucose-Capillary: 287 mg/dL — ABNORMAL HIGH (ref 65–99)

## 2017-09-28 MED ORDER — FUROSEMIDE 10 MG/ML IJ SOLN
40.0000 mg | Freq: Once | INTRAMUSCULAR | Status: AC
  Administered 2017-09-28: 40 mg via INTRAVENOUS
  Filled 2017-09-28: qty 4

## 2017-09-28 MED ORDER — INSULIN ASPART PROT & ASPART (70-30 MIX) 100 UNIT/ML ~~LOC~~ SUSP
40.0000 [IU] | Freq: Two times a day (BID) | SUBCUTANEOUS | Status: DC
  Administered 2017-09-29: 40 [IU] via SUBCUTANEOUS
  Filled 2017-09-28: qty 10

## 2017-09-28 MED ORDER — INSULIN ASPART 100 UNIT/ML ~~LOC~~ SOLN
20.0000 [IU] | Freq: Once | SUBCUTANEOUS | Status: AC
  Administered 2017-09-28: 20 [IU] via SUBCUTANEOUS

## 2017-09-28 MED ORDER — BUDESONIDE 0.25 MG/2ML IN SUSP
0.2500 mg | Freq: Two times a day (BID) | RESPIRATORY_TRACT | Status: DC
  Administered 2017-09-28 – 2017-09-30 (×4): 0.25 mg via RESPIRATORY_TRACT
  Filled 2017-09-28 (×4): qty 2

## 2017-09-28 NOTE — Progress Notes (Signed)
PROGRESS NOTE    Virginia Huber  PZW:[id number] DOB: December 12, 1945 DOA: 09/23/2017 PCP: Sharilyn Sites, MD    Brief Narrative:  72 y/o female admitted to the hospital with shortness of breath and respiratory failure. Found to have acute on chronic diastolic chf and likely acute bronchitis. Started on IV diuresis, neb treatments and IV steroids. Discharge home once respiratory status has improved.  Assessment & Plan:   Principal Problem:   Acute respiratory failure with hypoxia (HCC) Active Problems:   Essential hypertension   Hypothyroidism   DM type 2 causing vascular disease (HCC)   Acute diastolic CHF (congestive heart failure), NYHA class 3 (HCC)   CKD (chronic kidney disease) stage 3, GFR 30-59 ml/min (HCC)   Fever   1. Acute respiratory failure with hypoxia.  Patient presented with oxygen saturations in the 80s.  Supplemental oxygen has been applied.  Suspect this was a combination of CHF exacerbation with acute bronchitis.  Wean off oxygen as tolerated. May need to go home on oxygen.  2. Acute on chronic diastolic congestive heart failure.  BNP elevated at 841 on admission.  Recent echocardiogram from 07/2017 showed grade 2 diastolic dysfunction.  She was initially given intravenous Lasix.  Losartan on hold.  She was transitioned back to oral Lasix on 4/7, but since she continues to have evidence of volume overload, will give her another dose of IV Lasix today.  Overall net diuresis thus far has been unimpressive (-2.8L) 3. Acute bronchitis.  Respiratory viral panel positive for coronavirus.  Continue supportive care.  She continues to wheeze and cough and will need ongoing hospital care with nebs, IV steroids, antibiotics, oxygen.  We will also add an inhaled steroids 4. Generalized anxiety disorder with panic attacks- exacerbated by steroids, added lorazepam as needed with good improvement.  5. Atrial fibrillation.  Rate controlled on amiodarone, diltiazem and Coreg.  Anticoagulation  with apixaban.   6. Hypertension.  Continue Coreg, diltiazem.  Hydralazine and losartan currently on hold since patient is being actively diuresed. 7. Type 2 Diabetes with hyperglycemia - steroid induced.  Continue on 70/30 insulin.  Since she remains hyperglycemic, maintenance dose increased to 40 units twice daily.  Metformin currently on hold.  Monitor closely. 8. Hypothyroidism.  TSH elevated as it was in 07/2017.  Free T4 is also in similar range.  Patient reports compliance of medications.  She needs to follow up with endocrinologist after discharge.  9. Chronic kidney disease stage 3. Creatinine is currently at baseline. Continue to monitor with diuresis.   DVT prophylaxis: Apixaban Code Status: full code Family Communication: No family present today Disposition Plan: needs ongoing hospital care as noted above  Antimicrobials:   Levofloxacin 4/4>   Subjective: Still feels short of breath.  Has nonproductive cough.  Feels generally weak.  Objective: Vitals:   09/28/17 0828 09/28/17 1144 09/28/17 1329 09/28/17 1428  BP:   (!) 143/60   Pulse:   63   Resp:   18   Temp:   98.2 F (36.8 C)   TempSrc:   Oral   SpO2: 98% 97% 95% 96%  Weight:      Height:        Intake/Output Summary (Last 24 hours) at 09/28/2017 1854 Last data filed at 09/28/2017 1800 Gross per 24 hour  Intake 960 ml  Output 650 ml  Net 310 ml   Filed Weights   09/26/17 0500 09/27/17 0500 09/28/17 0404  Weight: 75.6 kg (166 lb 10.7 oz) 76.8 kg (169  lb 4.8 oz) 74.6 kg (164 lb 8 oz)   Examination:  General exam: Alert, awake, oriented x 3 Respiratory system: Bilateral wheezing. Respiratory effort normal. Cardiovascular system:RRR. No murmurs, rubs, gallops. Gastrointestinal system: Abdomen is nondistended, soft and nontender. No organomegaly or masses felt. Normal bowel sounds heard. Central nervous system: Alert and oriented. No focal neurological deficits. Extremities: 1+ edema bilaterally Skin: No  rashes, lesions or ulcers Psychiatry: Judgement and insight appear normal. Mood & affect appropriate.    Data Reviewed: I have personally reviewed following labs and imaging studies  CBC: Recent Labs  Lab 09/23/17 1940 09/24/17 0007 09/25/17 0627  WBC 15.8* 14.7* 12.2*  NEUTROABS 14.1* 12.9*  --   HGB 9.7* 9.1* 8.9*  HCT 29.9* 28.2* 27.9*  MCV 92.3 92.5 92.7  PLT 309 302 962   Basic Metabolic Panel: Recent Labs  Lab 09/23/17 1940 09/24/17 0559 09/25/17 0627 09/26/17 0616 09/28/17 0441  NA 135 138 135 131* 134*  K 3.4* 4.0 4.3 4.6 4.8  CL 101 102 100* 97* 98*  CO2 20* 24 21* 22 24  GLUCOSE 174* 155* 262* 269* 340*  BUN 26* 28* 38* 41* 41*  CREATININE 1.30* 1.35* 1.44* 1.27* 1.39*  CALCIUM 9.1 8.4* 8.7* 9.2 8.7*   GFR: Estimated Creatinine Clearance: 35.4 mL/min (A) (by C-G formula based on SCr of 1.39 mg/dL (H)). Liver Function Tests: Recent Labs  Lab 09/23/17 1940  AST 35  ALT 65*  ALKPHOS 64  BILITOT 1.6*  PROT 6.7  ALBUMIN 3.3*   No results for input(s): LIPASE, AMYLASE in the last 168 hours. No results for input(s): AMMONIA in the last 168 hours. Coagulation Profile: No results for input(s): INR, PROTIME in the last 168 hours. Cardiac Enzymes: Recent Labs  Lab 09/24/17 0007 09/24/17 0559  TROPONINI 0.03* 0.03*   BNP (last 3 results) No results for input(s): PROBNP in the last 8760 hours. HbA1C: No results for input(s): HGBA1C in the last 72 hours. CBG: Recent Labs  Lab 09/28/17 0402 09/28/17 0710 09/28/17 1112 09/28/17 1516 09/28/17 1627  GLUCAP 327* 323* 436* 412* 354*   Lipid Profile: No results for input(s): CHOL, HDL, LDLCALC, TRIG, CHOLHDL, LDLDIRECT in the last 72 hours. Thyroid Function Tests: No results for input(s): TSH, T4TOTAL, FREET4, T3FREE, THYROIDAB in the last 72 hours. Anemia Panel: No results for input(s): VITAMINB12, FOLATE, FERRITIN, TIBC, IRON, RETICCTPCT in the last 72 hours. Sepsis Labs: Recent Labs  Lab  09/23/17 1947 09/23/17 2232  LATICACIDVEN 0.95 0.82    Recent Results (from the past 240 hour(s))  Culture, blood (routine x 2)     Status: None   Collection Time: 09/23/17  7:33 PM  Result Value Ref Range Status   Specimen Description LEFT ANTECUBITAL DRAWN BY RN  Final   Special Requests   Final    BOTTLES DRAWN AEROBIC AND ANAEROBIC Blood Culture adequate volume   Culture   Final    NO GROWTH 5 DAYS Performed at [facility], [address], [place], [place] [address]    Report Status 09/28/2017 FINAL  Final  Culture, blood (routine x 2)     Status: None   Collection Time: 09/23/17  7:41 PM  Result Value Ref Range Status   Specimen Description RIGHT ANTECUBITAL  Final   Special Requests   Final    BOTTLES DRAWN AEROBIC AND ANAEROBIC Blood Culture adequate volume   Culture   Final    NO GROWTH 5 DAYS Performed at [facility], [address]  [place], [place] [address]    Report Status 09/28/2017 FINAL  Final  Respiratory Panel by PCR     Status: Abnormal   Collection Time: 09/23/17 11:20 PM  Result Value Ref Range Status   Adenovirus NOT DETECTED NOT DETECTED Final   Coronavirus 229E NOT DETECTED NOT DETECTED Final   Coronavirus HKU1 NOT DETECTED NOT DETECTED Final   Coronavirus NL63 NOT DETECTED NOT DETECTED Final   Coronavirus OC43 DETECTED (A) NOT DETECTED Final   Metapneumovirus NOT DETECTED NOT DETECTED Final   Rhinovirus / Enterovirus NOT DETECTED NOT DETECTED Final   Influenza A NOT DETECTED NOT DETECTED Final   Influenza B NOT DETECTED NOT DETECTED Final   Parainfluenza Virus 1 NOT DETECTED NOT DETECTED Final   Parainfluenza Virus 2 NOT DETECTED NOT DETECTED Final   Parainfluenza Virus 3 NOT DETECTED NOT DETECTED Final   Parainfluenza Virus 4 NOT DETECTED NOT DETECTED Final   Respiratory Syncytial Virus NOT DETECTED NOT DETECTED Final   Bordetella pertussis NOT DETECTED NOT DETECTED Final   Chlamydophila pneumoniae NOT DETECTED NOT DETECTED Final   Mycoplasma  pneumoniae NOT DETECTED NOT DETECTED Final    Comment: Performed at [facility], [place] [address], [place], [place] [address]    Radiology Studies: Dg Chest 2 View  Result Date: 09/28/2017 CLINICAL DATA:  Shortness of Breath EXAM: CHEST - 2 VIEW COMPARISON:  09/23/2017 FINDINGS: Improving aeration in the lungs. Mild residual interstitial prominence may reflect residual interstitial edema. Heart is borderline in size. Left pacer is unchanged. Small left effusion. No acute bony abnormality. IMPRESSION: Improving aeration and decreasing bilateral airspace opacities. Mild residual interstitial prominence may reflect residual interstitial edema. Small left effusion. Electronically Signed   By: Rolm Baptise M.D.   On: 09/28/2017 10:52   Scheduled Meds: . amiodarone  200 mg Oral Daily  . apixaban  5 mg Oral BID  . atorvastatin  80 mg Oral q1800  . carvedilol  25 mg Oral BID WC  . clopidogrel  75 mg Oral Daily  . diltiazem  360 mg Oral Daily  . feeding supplement (ENSURE ENLIVE)  237 mL Oral Q24H  . furosemide  40 mg Oral Daily  . insulin aspart  0-20 Units Subcutaneous TID WC  . insulin aspart  0-5 Units Subcutaneous QHS  . insulin aspart protamine- aspart  35 Units Subcutaneous BID WC  . ipratropium-albuterol  3 mL Nebulization Q6H  . levofloxacin  750 mg Oral Q48H  . levothyroxine  200 mcg Oral QAC breakfast  . methylPREDNISolone (SOLU-MEDROL) injection  40 mg Intravenous Q12H  . potassium chloride SA  20 mEq Oral Daily  . rOPINIRole  0.5 mg Oral QHS  . sodium chloride flush  3 mL Intravenous Q12H   Continuous Infusions: . sodium chloride       LOS: 5 days   Kathie Dike, MD [facility] Pager [phone]  If 7PM-7AM, please contact night-coverage [email or web address] Password [facility] 09/28/2017, 6:54 PM

## 2017-09-28 NOTE — Progress Notes (Signed)
Notified dr that pt's CBG was 436.

## 2017-09-28 NOTE — Progress Notes (Addendum)
Notified dr that CBG was 412.  Dr. Roderic Palau ordered 20 units of Novolog and to recheck CBG in 1 hour

## 2017-09-28 NOTE — Progress Notes (Signed)
Physical Therapy Treatment Patient Details Name: Virginia Huber MRN: [id number] DOB: 1946-05-31 Today's Date: 09/28/2017    History of Present Illness MARK BENECKE is a 72 y.o. female with medical history significant of DM, afib, PAD, CAD, hypothyroidism, HLD, and HTN presenting with fever and SOB.  She went to Dr. [name] Tuesday for a checkup and noticed SOB - he thought it was a URI and he gave her a steroid injection and a Z-pack and a breathing treatment.  It has continued to progress since and today she couldn't really breathe at all.  SOB is constant this week, not related to exertion.  Cough productive of "crud" - gray-looking.  Fever to 101.4 tonight, this was the first.  She has 2 more doses of azithromycin left to complete the course.  She was unable to lie flat at home.  She does feel better with the O2.  +LE edema.  She has not been sleeping because she couldn't breathe.  Mild chest discomfort that she attributes to being so short of breath.    PT Comments     Pt pleasant and willing to participate with therapist today.  Pt sitting in chair upon arrival with 2L O2 and saturation at 98% with assistance.  Trial with gait without O2 assistance.  Decrease in saturation to 87% following 12-15 feet of gait.  Educated on diaphragmatic breathing techniques with ability to increase to 92% while standing.  Pt unable to keep O2 saturation above 93% during gait so resumed O2 assistance for rest of gait training.  Unable to keep with 1L so resumed to 2L O2.  EOS pt left in chair with call bell within reach and chair alarm set.    Follow Up Recommendations        Equipment Recommendations       Recommendations for Other Services       Precautions / Restrictions Precautions Precautions: Fall Required Braces or Orthoses: Other Brace/Splint Other Brace/Splint: CAM Boot right foot Restrictions Weight Bearing Restrictions: No    Mobility  Bed Mobility               General bed  mobility comments: Patient presents up in chair (assisted by nursing staff)  Transfers Overall transfer level: Modified independent Equipment used: None Transfers: Sit to/from Stand Sit to Stand: Min guard         General transfer comment: cueing for HHA with STS  Ambulation/Gait Ambulation/Gait assistance: Supervision Ambulation Distance (Feet): 120 Feet Assistive device: Rolling walker (2 wheeled) Gait Pattern/deviations: Decreased step length - right;Decreased step length - left;Decreased stride length;Drifts right/left   Gait velocity interpretation: Below normal speed for age/gender General Gait Details: Good stability with standing and gait, use of RW no LOB episodes.  Initially without O2 A, decreased to 87% ~12-15 feet into gait training so added 2L O2 with saturation between 91-96%   Stairs            Wheelchair Mobility    Modified Rankin (Stroke Patients Only)       Balance                                            Cognition Arousal/Alertness: Awake/alert Behavior During Therapy: WFL for tasks assessed/performed Overall Cognitive Status: Within Functional Limits for tasks assessed  Exercises      General Comments        Pertinent Vitals/Pain Pain Assessment: No/denies pain    Home Living                      Prior Function            PT Goals (current goals can now be found in the care plan section) Progress towards PT goals: Progressing toward goals    Frequency    Min 5X/week      PT Plan Current plan remains appropriate    Co-evaluation              AM-PAC PT "6 Clicks" Daily Activity  Outcome Measure  Difficulty turning over in bed (including adjusting bedclothes, sheets and blankets)?: None Difficulty moving from lying on back to sitting on the side of the bed? : None Difficulty sitting down on and standing up from a chair with  arms (e.g., wheelchair, bedside commode, etc,.)?: None Help needed moving to and from a bed to chair (including a wheelchair)?: None Help needed walking in hospital room?: A Little Help needed climbing 3-5 steps with a railing? : A Little 6 Click Score: 22    End of Session Equipment Utilized During Treatment: Gait belt Activity Tolerance: Patient tolerated treatment well;Patient limited by fatigue Patient left: in chair;with call bell/phone within reach;with chair alarm set Nurse Communication: Mobility status PT Visit Diagnosis: Unsteadiness on feet (R26.81);Other abnormalities of gait and mobility (R26.89);Muscle weakness (generalized) (M62.81)     Time: 2536-6440 PT Time Calculation (min) (ACUTE ONLY): 17 min  Charges:  $Therapeutic Activity: 8-22 mins                    G Codes:     Ihor Austin, LPTA; CBIS [phone]   Aldona Lento 09/28/2017, 11:49 AM

## 2017-09-28 NOTE — Plan of Care (Signed)
progressing 

## 2017-09-28 NOTE — Care Management Note (Signed)
Case Management Note  Patient Details  Name: Virginia Huber MRN: [id number] Date of Birth: 12-16-1945  If discussed at Long Length of Stay Meetings, dates discussed:  09/28/2017  Additional Comments:   Idamay Hosein, Chauncey Reading, RN 09/28/2017, 12:03 PM

## 2017-09-29 ENCOUNTER — Encounter: Payer: Self-pay | Admitting: Cardiology

## 2017-09-29 LAB — BASIC METABOLIC PANEL
Anion gap: 10 (ref 5–15)
BUN: 33 mg/dL — AB (ref 6–20)
CALCIUM: 8.6 mg/dL — AB (ref 8.9–10.3)
CHLORIDE: 96 mmol/L — AB (ref 101–111)
CO2: 30 mmol/L (ref 22–32)
CREATININE: 1.12 mg/dL — AB (ref 0.44–1.00)
GFR, EST AFRICAN AMERICAN: 55 mL/min — AB (ref >60)
GFR, EST NON AFRICAN AMERICAN: 48 mL/min — AB (ref >60)
Glucose, Bld: 289 mg/dL — ABNORMAL HIGH (ref 65–99)
Potassium: 4.2 mmol/L (ref 3.5–5.1)
SODIUM: 136 mmol/L (ref 135–145)

## 2017-09-29 LAB — GLUCOSE, CAPILLARY
GLUCOSE-CAPILLARY: 277 mg/dL — AB (ref 65–99)
GLUCOSE-CAPILLARY: 312 mg/dL — AB (ref 65–99)
GLUCOSE-CAPILLARY: 407 mg/dL — AB (ref 65–99)
GLUCOSE-CAPILLARY: 366 mg/dL — AB (ref 65–99)
Glucose-Capillary: 410 mg/dL — ABNORMAL HIGH (ref 65–99)
Glucose-Capillary: 395 mg/dL — ABNORMAL HIGH (ref 65–99)
Glucose-Capillary: 326 mg/dL — ABNORMAL HIGH (ref 65–99)

## 2017-09-29 LAB — CBC
HCT: 30.5 % — ABNORMAL LOW (ref 36.0–46.0)
Hemoglobin: 9.6 g/dL — ABNORMAL LOW (ref 12.0–15.0)
MCH: 29.1 pg (ref 26.0–34.0)
MCHC: 31.5 g/dL (ref 30.0–36.0)
MCV: 92.4 fL (ref 78.0–100.0)
PLATELETS: 377 10*3/uL (ref 150–400)
RBC: 3.3 MIL/uL — AB (ref 3.87–5.11)
RDW: 15.5 % (ref 11.5–15.5)
WBC: 14.9 10*3/uL — AB (ref 4.0–10.5)

## 2017-09-29 LAB — GLUCOSE, RANDOM: GLUCOSE: 387 mg/dL — AB (ref 65–99)

## 2017-09-29 MED ORDER — PREDNISONE 20 MG PO TABS
40.0000 mg | ORAL_TABLET | Freq: Every day | ORAL | Status: DC
  Administered 2017-09-30: 40 mg via ORAL
  Filled 2017-09-29: qty 2

## 2017-09-29 MED ORDER — FUROSEMIDE 10 MG/ML IJ SOLN
40.0000 mg | Freq: Once | INTRAMUSCULAR | Status: AC
  Administered 2017-09-29: 40 mg via INTRAVENOUS
  Filled 2017-09-29: qty 4

## 2017-09-29 MED ORDER — INSULIN ASPART 100 UNIT/ML ~~LOC~~ SOLN
20.0000 [IU] | Freq: Once | SUBCUTANEOUS | Status: AC
  Administered 2017-09-29: 20 [IU] via SUBCUTANEOUS

## 2017-09-29 MED ORDER — INSULIN ASPART PROT & ASPART (70-30 MIX) 100 UNIT/ML ~~LOC~~ SUSP
45.0000 [IU] | Freq: Two times a day (BID) | SUBCUTANEOUS | Status: DC
  Administered 2017-09-29 – 2017-09-30 (×2): 45 [IU] via SUBCUTANEOUS
  Filled 2017-09-29: qty 10

## 2017-09-29 MED ORDER — GLUCERNA SHAKE PO LIQD
237.0000 mL | Freq: Three times a day (TID) | ORAL | Status: DC
Start: 2017-09-29 — End: 2017-09-30
  Administered 2017-09-30: 237 mL via ORAL

## 2017-09-29 MED ORDER — HYDRALAZINE HCL 25 MG PO TABS
25.0000 mg | ORAL_TABLET | Freq: Four times a day (QID) | ORAL | Status: DC | PRN
  Administered 2017-09-29: 25 mg via ORAL
  Filled 2017-09-29: qty 1

## 2017-09-29 NOTE — Care Management Important Message (Signed)
Important Message  Patient Details  Name: Virginia Huber MRN: [id number] Date of Birth: [birth year]   Medicare Important Message Given:  Yes    Shelda Altes 09/29/2017, 11:56 AM

## 2017-09-29 NOTE — Progress Notes (Signed)
MD gave verbal order to give patient in 312 a one time dose of 20 units of Novolog and to recheck patient's CBG in 1 hour after administration due to patient's hyperglycemia. Will continue to monitor.

## 2017-09-29 NOTE — Progress Notes (Signed)
Patient BP was 164/61 at 0430 I rechecked with manual and was 168/78. Paged G. Lama and got an order for PRN hydralazine for BP >160. Gave PRN dose at 0559.  Will continue to monitor patient.

## 2017-09-29 NOTE — Progress Notes (Signed)
Called MD with CBG of 407. MD gave verbal order to give patient 20 units of Novolog and recheck CBG in one hour. I also put in order for STAT lab draw per hyperglycemia protocol. Will continue to monitor.

## 2017-09-29 NOTE — Progress Notes (Addendum)
SATURATION QUALIFICATIONS: (This note is used to comply with regulatory documentation for home oxygen)  Patient Saturations on Room Air at Rest = 96%  Patient Saturations on Room Air while Ambulating = 84%  Patient Saturations on 2L oxygen at rest = 98%

## 2017-09-29 NOTE — Progress Notes (Signed)
PROGRESS NOTE    Virginia Huber  YKD:[id number] DOB: 1946-03-31 DOA: 09/23/2017 PCP: Sharilyn Sites, MD    Brief Narrative:  72 y/o female admitted to the hospital with shortness of breath and respiratory failure. Found to have acute on chronic diastolic chf and likely acute bronchitis. Started on IV diuresis, neb treatments and IV steroids. Discharge home once respiratory status has improved.  Assessment & Plan:   Principal Problem:   Acute respiratory failure with hypoxia (HCC) Active Problems:   Essential hypertension   Hypothyroidism   DM type 2 causing vascular disease (HCC)   Acute diastolic CHF (congestive heart failure), NYHA class 3 (HCC)   CKD (chronic kidney disease) stage 3, GFR 30-59 ml/min (HCC)   Fever   1. Acute respiratory failure with hypoxia.  Patient presented with oxygen saturations in the 80s.  Supplemental oxygen has been applied.  Suspect this was a combination of CHF exacerbation with acute bronchitis.  She would likely need to go home on oxygen, since  she desaturates on ambulation. 2. Acute on chronic diastolic congestive heart failure.  BNP elevated at 841 on admission.  Recent echocardiogram from 07/2017 showed grade 2 diastolic dysfunction.  She was initially given intravenous Lasix. She was transitioned back to oral Lasix on 4/7, but since she continues to have evidence of volume overload, she was restarted on IV Lasix on 4/9.  She has had a good response to IV Lasix and will receive another dose today. Net fluid status is -3.2 L.   3. Acute bronchitis.  Respiratory viral panel positive for coronavirus.  Continue supportive care.  Overall wheezing has improved.  She is completed a course of antibiotics.  Will transition steroids to prednisone taper.  Continue bronchodilators. 4. Generalized anxiety disorder with panic attacks- exacerbated by steroids, added lorazepam as needed with good improvement.  5. Atrial fibrillation.  Rate controlled on amiodarone,  diltiazem and Coreg.  Anticoagulation with apixaban.   6. Hypertension.  Continue Coreg, diltiazem.  Hydralazine and losartan currently on hold since patient is being actively diuresed. 7. Type 2 Diabetes with hyperglycemia - steroid induced.  Continue on 70/30 insulin.  Since she remains hyperglycemic, maintenance dose increased to 45 units twice daily.  Metformin currently on hold.  Continue sliding scale insulin.  Monitor closely. 8. Hypothyroidism.  TSH elevated as it was in 07/2017.  Free T4 is also in similar range.  Patient reports compliance of medications.  She needs to follow up with endocrinologist after discharge.  9. Chronic kidney disease stage 3. Creatinine is currently at baseline. Continue to monitor with diuresis.   DVT prophylaxis: Apixaban Code Status: full code Family Communication: No family present today Disposition Plan: needs ongoing hospital care as noted above  Antimicrobials:   Levofloxacin 4/4>   Subjective: Continues to feel weak, but overall shortness of breath is improving.  Objective: Vitals:   09/29/17 0751 09/29/17 0757 09/29/17 1418 09/29/17 1449  BP:   (!) 120/52   Pulse:   64   Resp:   20   Temp:   98 F (36.7 C)   TempSrc:   Oral   SpO2: 99% 100% 94% 96%  Weight:      Height:        Intake/Output Summary (Last 24 hours) at 09/29/2017 1639 Last data filed at 09/29/2017 1304 Gross per 24 hour  Intake 960 ml  Output 1400 ml  Net -440 ml   Filed Weights   09/27/17 0500 09/28/17 0404 09/29/17 0500  Weight:  76.8 kg (169 lb 4.8 oz) 74.6 kg (164 lb 8 oz) 72.9 kg (160 lb 12.8 oz)   Examination:  General exam: Alert, awake, oriented x 3 Respiratory system: mild wheeze bilaterally. Respiratory effort normal. Cardiovascular system:RRR. No murmurs, rubs, gallops. Gastrointestinal system: Abdomen is nondistended, soft and nontender. No organomegaly or masses felt. Normal bowel sounds heard. Central nervous system: Alert and oriented. No focal  neurological deficits. Extremities: 1+ edema bilaterally Skin: No rashes, lesions or ulcers Psychiatry: Judgement and insight appear normal. Mood & affect appropriate.     Data Reviewed: I have personally reviewed following labs and imaging studies  CBC: Recent Labs  Lab 09/23/17 1940 09/24/17 0007 09/25/17 0627 09/29/17 0552  WBC 15.8* 14.7* 12.2* 14.9*  NEUTROABS 14.1* 12.9*  --   --   HGB 9.7* 9.1* 8.9* 9.6*  HCT 29.9* 28.2* 27.9* 30.5*  MCV 92.3 92.5 92.7 92.4  PLT 309 302 301 174   Basic Metabolic Panel: Recent Labs  Lab 09/24/17 0559 09/25/17 0627 09/26/17 0616 09/28/17 0441 09/29/17 0552 09/29/17 1233  NA 138 135 131* 134* 136  --   K 4.0 4.3 4.6 4.8 4.2  --   CL 102 100* 97* 98* 96*  --   CO2 24 21* 22 24 30   --   GLUCOSE 155* 262* 269* 340* 289* 387*  BUN 28* 38* 41* 41* 33*  --   CREATININE 1.35* 1.44* 1.27* 1.39* 1.12*  --   CALCIUM 8.4* 8.7* 9.2 8.7* 8.6*  --    GFR: Estimated Creatinine Clearance: 43.4 mL/min (A) (by C-G formula based on SCr of 1.12 mg/dL (H)). Liver Function Tests: Recent Labs  Lab 09/23/17 1940  AST 35  ALT 65*  ALKPHOS 64  BILITOT 1.6*  PROT 6.7  ALBUMIN 3.3*   No results for input(s): LIPASE, AMYLASE in the last 168 hours. No results for input(s): AMMONIA in the last 168 hours. Coagulation Profile: No results for input(s): INR, PROTIME in the last 168 hours. Cardiac Enzymes: Recent Labs  Lab 09/24/17 0007 09/24/17 0559  TROPONINI 0.03* 0.03*   BNP (last 3 results) No results for input(s): PROBNP in the last 8760 hours. HbA1C: No results for input(s): HGBA1C in the last 72 hours. CBG: Recent Labs  Lab 09/29/17 0428 09/29/17 0728 09/29/17 1103 09/29/17 1434 09/29/17 1620  GLUCAP 277* 312* 407* 410* 395*   Lipid Profile: No results for input(s): CHOL, HDL, LDLCALC, TRIG, CHOLHDL, LDLDIRECT in the last 72 hours. Thyroid Function Tests: No results for input(s): TSH, T4TOTAL, FREET4, T3FREE, THYROIDAB in the  last 72 hours. Anemia Panel: No results for input(s): VITAMINB12, FOLATE, FERRITIN, TIBC, IRON, RETICCTPCT in the last 72 hours. Sepsis Labs: Recent Labs  Lab 09/23/17 1947 09/23/17 2232  LATICACIDVEN 0.95 0.82    Recent Results (from the past 240 hour(s))  Culture, blood (routine x 2)     Status: None   Collection Time: 09/23/17  7:33 PM  Result Value Ref Range Status   Specimen Description LEFT ANTECUBITAL DRAWN BY RN  Final   Special Requests   Final    BOTTLES DRAWN AEROBIC AND ANAEROBIC Blood Culture adequate volume   Culture   Final    NO GROWTH 5 DAYS Performed at [facility], [address] [place], [place] [address]    Report Status 09/28/2017 FINAL  Final  Culture, blood (routine x 2)     Status: None   Collection Time: 09/23/17  7:41 PM  Result Value Ref Range Status   Specimen  Description RIGHT ANTECUBITAL  Final   Special Requests   Final    BOTTLES DRAWN AEROBIC AND ANAEROBIC Blood Culture adequate volume   Culture   Final    NO GROWTH 5 DAYS Performed at [facility], [address], [place], [place] [address]    Report Status 09/28/2017 FINAL  Final  Respiratory Panel by PCR     Status: Abnormal   Collection Time: 09/23/17 11:20 PM  Result Value Ref Range Status   Adenovirus NOT DETECTED NOT DETECTED Final   Coronavirus 229E NOT DETECTED NOT DETECTED Final   Coronavirus HKU1 NOT DETECTED NOT DETECTED Final   Coronavirus NL63 NOT DETECTED NOT DETECTED Final   Coronavirus OC43 DETECTED (A) NOT DETECTED Final   Metapneumovirus NOT DETECTED NOT DETECTED Final   Rhinovirus / Enterovirus NOT DETECTED NOT DETECTED Final   Influenza A NOT DETECTED NOT DETECTED Final   Influenza B NOT DETECTED NOT DETECTED Final   Parainfluenza Virus 1 NOT DETECTED NOT DETECTED Final   Parainfluenza Virus 2 NOT DETECTED NOT DETECTED Final   Parainfluenza Virus 3 NOT DETECTED NOT DETECTED Final   Parainfluenza Virus 4 NOT DETECTED NOT DETECTED Final   Respiratory Syncytial  Virus NOT DETECTED NOT DETECTED Final   Bordetella pertussis NOT DETECTED NOT DETECTED Final   Chlamydophila pneumoniae NOT DETECTED NOT DETECTED Final   Mycoplasma pneumoniae NOT DETECTED NOT DETECTED Final    Comment: Performed at [facility], [place] [address], [place], [place] [address]    Radiology Studies: Dg Chest 2 View  Result Date: 09/28/2017 CLINICAL DATA:  Shortness of Breath EXAM: CHEST - 2 VIEW COMPARISON:  09/23/2017 FINDINGS: Improving aeration in the lungs. Mild residual interstitial prominence may reflect residual interstitial edema. Heart is borderline in size. Left pacer is unchanged. Small left effusion. No acute bony abnormality. IMPRESSION: Improving aeration and decreasing bilateral airspace opacities. Mild residual interstitial prominence may reflect residual interstitial edema. Small left effusion. Electronically Signed   By: Rolm Baptise M.D.   On: 09/28/2017 10:52   Scheduled Meds: . amiodarone  200 mg Oral Daily  . apixaban  5 mg Oral BID  . atorvastatin  80 mg Oral q1800  . budesonide (PULMICORT) nebulizer solution  0.25 mg Nebulization BID  . carvedilol  25 mg Oral BID WC  . clopidogrel  75 mg Oral Daily  . diltiazem  360 mg Oral Daily  . feeding supplement (ENSURE ENLIVE)  237 mL Oral Q24H  . furosemide  40 mg Oral Daily  . insulin aspart  0-20 Units Subcutaneous TID WC  . insulin aspart  0-5 Units Subcutaneous QHS  . insulin aspart protamine- aspart  45 Units Subcutaneous BID WC  . ipratropium-albuterol  3 mL Nebulization Q6H  . levofloxacin  750 mg Oral Q48H  . levothyroxine  200 mcg Oral QAC breakfast  . potassium chloride SA  20 mEq Oral Daily  . [START ON 09/30/2017] predniSONE  40 mg Oral Q breakfast  . rOPINIRole  0.5 mg Oral QHS  . sodium chloride flush  3 mL Intravenous Q12H   Continuous Infusions: . sodium chloride       LOS: 6 days   Kathie Dike, MD [facility] Pager [phone]  If 7PM-7AM, please contact  night-coverage [email or web address] Password [facility] 09/29/2017, 4:39 PM

## 2017-09-30 LAB — BASIC METABOLIC PANEL
ANION GAP: 9 (ref 5–15)
BUN: 38 mg/dL — ABNORMAL HIGH (ref 6–20)
CALCIUM: 8.2 mg/dL — AB (ref 8.9–10.3)
CO2: 30 mmol/L (ref 22–32)
Chloride: 99 mmol/L — ABNORMAL LOW (ref 101–111)
Creatinine, Ser: 1.26 mg/dL — ABNORMAL HIGH (ref 0.44–1.00)
GFR calc non Af Amer: 42 mL/min — ABNORMAL LOW (ref >60)
GFR, EST AFRICAN AMERICAN: 48 mL/min — AB (ref >60)
Glucose, Bld: 183 mg/dL — ABNORMAL HIGH (ref 65–99)
Potassium: 3.9 mmol/L (ref 3.5–5.1)
SODIUM: 138 mmol/L (ref 135–145)

## 2017-09-30 LAB — GLUCOSE, CAPILLARY
GLUCOSE-CAPILLARY: 205 mg/dL — AB (ref 65–99)
GLUCOSE-CAPILLARY: 170 mg/dL — AB (ref 65–99)
GLUCOSE-CAPILLARY: 322 mg/dL — AB (ref 65–99)

## 2017-09-30 MED ORDER — FUROSEMIDE 40 MG PO TABS: 60.0000 mg | ORAL_TABLET | Freq: Every day | ORAL | 3 refills | Status: DC

## 2017-09-30 MED ORDER — TRAZODONE HCL 50 MG PO TABS: 50.0000 mg | ORAL_TABLET | Freq: Every evening | ORAL | 0 refills | Status: DC | PRN

## 2017-09-30 MED ORDER — LEVOTHYROXINE SODIUM 200 MCG PO TABS: 200.0000 ug | ORAL_TABLET | Freq: Every day | ORAL | 0 refills | Status: DC

## 2017-09-30 MED ORDER — PREDNISONE 10 MG PO TABS: ORAL_TABLET | ORAL | 0 refills | Status: DC

## 2017-09-30 MED ORDER — INSULIN NPH ISOPHANE & REGULAR (70-30) 100 UNIT/ML ~~LOC~~ SUSP: 40.0000 [IU] | SUBCUTANEOUS | 11 refills | Status: DC

## 2017-09-30 MED ORDER — ALBUTEROL SULFATE (2.5 MG/3ML) 0.083% IN NEBU: 2.5000 mg | INHALATION_SOLUTION | Freq: Four times a day (QID) | RESPIRATORY_TRACT | 12 refills | Status: DC | PRN

## 2017-09-30 NOTE — Progress Notes (Signed)
Discharge instructions reviewed and IV removed.  Follow up appts made and scripts sent to pharmacy. Husband to drive home

## 2017-09-30 NOTE — Progress Notes (Signed)
Physical Therapy Treatment Patient Details Name: ABAGAYLE KLUTTS MRN: [id number] DOB: 1945-11-21 Today's Date: 09/30/2017    History of Present Illness ALEAYA LATONA is a 72 y.o. female with medical history significant of DM, afib, PAD, CAD, hypothyroidism, HLD, and HTN presenting with fever and SOB.  She went to Dr. [name] Tuesday for a checkup and noticed SOB - he thought it was a URI and he gave her a steroid injection and a Z-pack and a breathing treatment.  It has continued to progress since and today she couldn't really breathe at all.  SOB is constant this week, not related to exertion.  Cough productive of "crud" - gray-looking.  Fever to 101.4 tonight, this was the first.  She has 2 more doses of azithromycin left to complete the course.  She was unable to lie flat at home.  She does feel better with the O2.  +LE edema.  She has not been sleeping because she couldn't breathe.  Mild chest discomfort that she attributes to being so short of breath.    PT Comments    Pt progressing well, encouraged RW use to improve safety and activity tolerance. AMB up to 366ft on room air this date with SpO2: 93%. Pt continues to feel stronger and better tolerate basic mobility. Pt making excellent progress toward goals.  Position/Activity  SpO2  Flow Rate   seated, resting 95% 2L/min  AMB 132ft 91% room air  AMB 226ft 92% room air  AMB 361ft 93% room air  60sec seated recovery  91% room air      Follow Up Recommendations  Home health PT;Supervision for mobility/OOB     Equipment Recommendations  Rolling walker with 5" wheels    Recommendations for Other Services       Precautions / Restrictions Precautions Precautions: Fall Required Braces or Orthoses: Other Brace/Splint Other Brace/Splint: CAM Boot right foot Restrictions Weight Bearing Restrictions: No    Mobility  Bed Mobility               General bed mobility comments: Patient presents up in chair (assisted by nursing  staff)  Transfers Overall transfer level: Modified independent Equipment used: None Transfers: Sit to/from Stand Sit to Stand: Modified independent (Device/Increase time)            Ambulation/Gait Ambulation/Gait assistance: Supervision Ambulation Distance (Feet): 370 Feet Assistive device: Rolling walker (2 wheeled)       General Gait Details: Good stability with standing and gait, use of RW no LOB episodes.  All performed on room air, with terminal SpO2: 93%   Stairs            Wheelchair Mobility    Modified Rankin (Stroke Patients Only)       Balance           Standing balance support: During functional activity;Bilateral upper extremity supported Standing balance-Leahy Scale: Fair                              Cognition Arousal/Alertness: Awake/alert Behavior During Therapy: WFL for tasks assessed/performed Overall Cognitive Status: Within Functional Limits for tasks assessed                                        Exercises      General Comments        Pertinent Vitals/Pain  Pain Assessment: No/denies pain    Home Living                      Prior Function            PT Goals (current goals can now be found in the care plan section) Acute Rehab PT Goals Patient Stated Goal: return home PT Goal Formulation: With patient Time For Goal Achievement: 10/01/17 Potential to Achieve Goals: Good Progress towards PT goals: Progressing toward goals    Frequency    Min 3X/week      PT Plan Current plan remains appropriate    Co-evaluation              AM-PAC PT "6 Clicks" Daily Activity  Outcome Measure  Difficulty turning over in bed (including adjusting bedclothes, sheets and blankets)?: None Difficulty moving from lying on back to sitting on the side of the bed? : None Difficulty sitting down on and standing up from a chair with arms (e.g., wheelchair, bedside commode, etc,.)?:  None Help needed moving to and from a bed to chair (including a wheelchair)?: None Help needed walking in hospital room?: None Help needed climbing 3-5 steps with a railing? : A Little 6 Click Score: 23    End of Session Equipment Utilized During Treatment: Oxygen Activity Tolerance: Patient tolerated treatment well Patient left: in chair;with call bell/phone within reach;with chair alarm set;with family/visitor present Nurse Communication: Mobility status PT Visit Diagnosis: Unsteadiness on feet (R26.81);Other abnormalities of gait and mobility (R26.89);Muscle weakness (generalized) (M62.81)     Time: 5974-1638 PT Time Calculation (min) (ACUTE ONLY): 13 min  Charges:  $Therapeutic Activity: 8-22 mins                    G Codes:       12:30 PM, 10-03-2017 Etta Grandchild, PT, DPT Physical Therapist - [place] [phone] (Office)     Bejamin Hackbart C 10/03/2017, 12:28 PM

## 2017-09-30 NOTE — Discharge Summary (Signed)
Physician Discharge Summary  Virginia Huber CLE:[id number] DOB: 02/09/1946 DOA: 09/23/2017  PCP: Sharilyn Sites, MD  Admit date: 09/23/2017 Discharge date: 09/30/2017  Admitted From: home Disposition:  home  Recommendations for Outpatient Follow-up:  1. Follow up with PCP in 1-2 weeks 2. Please obtain BMP/CBC in one week 3. Cardiology follow-up arranged on 4/17 4. Repeat thyroid function tests in 4 weeks, levothyroxine dose has been increased to 223mcg daily 5. Continue to monitor blood sugars for hypoglycemia as steroids are being tapered.  Insulin may need to be adjusted.  Home Health:HHPT Equipment/Devices: oxygen 2L, nebulizer  Discharge Condition: Stable CODE STATUS: Full code Diet recommendation: Heart Healthy / Carb Modified   Brief/Interim Summary: 72 y/o female admitted to the hospital with shortness of breath and respiratory failure. Found to have acute on chronic diastolic chf and likely acute bronchitis. Started on IV diuresis, neb treatments and IV steroids  Discharge Diagnoses:  Principal Problem:   Acute respiratory failure with hypoxia (HCC) Active Problems:   Essential hypertension   Hypothyroidism   DM type 2 causing vascular disease (HCC)   Acute diastolic CHF (congestive heart failure), NYHA class 3 (HCC)   CKD (chronic kidney disease) stage 3, GFR 30-59 ml/min (HCC)   Fever  1. Acute respiratory failure with hypoxia.  Patient presented with oxygen saturations in the 80s.  Suspect this was a combination of CHF exacerbation with acute bronchitis.    Since admission, she has improved.  She is able to maintain saturations on room air while at rest, but does desaturate on ambulation.  She will be discharged home with supplemental oxygen.. 2. Acute on chronic diastolic congestive heart failure.  BNP elevated at 841 on admission.  Recent echocardiogram from 07/2017 showed grade 2 diastolic dysfunction.  Patient was treated with intravenous Lasix and overall volume  status has improved.  Lungs are now clear and lower extremity edema has resolved.  She is chronically on Lasix 40 mg daily.  Will increase to 60 mg daily.  Follow-up with cardiology has been arranged for next week.  She has been advised to follow her weights daily..   3. Acute bronchitis.  Respiratory viral panel positive for coronavirus.    Patient was treated with a course of antibiotics, intravenous steroids and bronchodilators.  Overall respiratory status has improved.  Wheezing has resolved.  She was placed on a prednisone taper.  She will also be discharged home with bronchodilators and mucolytic's.  She is completed her course of antibiotics.. 4. Generalized anxiety disorder with panic attacks- exacerbated by steroids, added lorazepam as needed with good improvement.  5. Atrial fibrillation.  Rate controlled on amiodarone, diltiazem and Coreg.  Anticoagulation with apixaban.   6. Hypertension.  Continue Coreg, diltiazem.    Will restart losartan on discharge.  Continue to hold hydralazine since she is currently normotensive.  This can be resumed as an outpatient as felt appropriate.. 7. Type 2 Diabetes with hyperglycemia - steroid induced.  Continued on 70/30 insulin.  Insulin was adjusted in the hospital for better blood glucose control.  Anticipate that her sugars will continue to improve as steroids are tapered.  She is been advised to follow her blood sugars at home so as to not have any hypoglycemia as steroids are tapered.   8. Hypothyroidism.  TSH elevated as it was in 07/2017.  Free T4 is also in similar range.  Patient reports compliance of medications.    Levothyroxine dose has been increased to  250mcg daily.  She needs  to follow up with endocrinologist after discharge.  9. Chronic kidney disease stage 3. Creatinine is currently at baseline. Continue to monitor with diuresis.   Discharge Instructions  Discharge Instructions    DME Nebulizer machine   Complete by:  As directed     Patient needs a nebulizer to treat with the following condition:  Bronchitis   Diet - low sodium heart healthy   Complete by:  As directed    Increase activity slowly   Complete by:  As directed      Allergies as of 09/30/2017      Reactions   Penicillins Hives   Has patient had a PCN reaction causing immediate rash, facial/tongue/throat swelling, SOB or lightheadedness with hypotension: Yes Has patient had a PCN reaction causing severe rash involving mucus membranes or skin necrosis: No Has patient had a PCN reaction that required hospitalization No Has patient had a PCN reaction occurring within the last 10 years: No If all of the above answers are "NO", then may proceed with Cephalosporin use.   Percocet [oxycodone-acetaminophen] Nausea And Vomiting      Medication List    STOP taking these medications   hydrALAZINE 50 MG tablet Commonly known as:  APRESOLINE     TAKE these medications   acetaminophen 325 MG tablet Commonly known as:  TYLENOL Take 650 mg by mouth every 6 (six) hours as needed for headache.   albuterol 108 (90 Base) MCG/ACT inhaler Commonly known as:  PROVENTIL HFA;VENTOLIN HFA Inhale 2 puffs into the lungs every 6 (six) hours as needed for wheezing or shortness of breath. What changed:  Another medication with the same name was added. Make sure you understand how and when to take each.   albuterol (2.5 MG/3ML) 0.083% nebulizer solution Commonly known as:  PROVENTIL Take 3 mLs (2.5 mg total) by nebulization every 6 (six) hours as needed for wheezing or shortness of breath. What changed:  You were already taking a medication with the same name, and this prescription was added. Make sure you understand how and when to take each.   amiodarone 200 MG tablet Commonly known as:  PACERONE Take 1 tablet (200 mg total) by mouth daily.   apixaban 5 MG Tabs tablet Commonly known as:  ELIQUIS TAKE (1) TABLET BY MOUTH TWICE DAILY.   atorvastatin 80 MG  tablet Commonly known as:  LIPITOR Take 1 tablet (80 mg total) by mouth daily at 6 PM.   carvedilol 25 MG tablet Commonly known as:  COREG Take 1 tablet (25 mg total) by mouth 2 (two) times daily.   cholecalciferol 1000 units tablet Commonly known as:  VITAMIN D Take 50,000 Units by mouth once a week.   clopidogrel 75 MG tablet Commonly known as:  PLAVIX TAKE ONE (1) TABLET BY MOUTH EVERY DAY   diltiazem 360 MG 24 hr capsule Commonly known as:  CARDIZEM CD Take 1 capsule (360 mg total) by mouth daily.   furosemide 40 MG tablet Commonly known as:  LASIX Take 1.5 tablets (60 mg total) by mouth daily. What changed:  how much to take   glucose blood test strip Commonly known as:  FREESTYLE LITE USE AS DIRECTED TWICE DAILY.   insulin NPH-regular Human (70-30) 100 UNIT/ML injection Commonly known as:  NOVOLIN 70/30 Inject 40 Units into the skin See admin instructions. Inject 25 units before breakfast and 25 units before supper  - only if pre-meal blood glucose is above 90 mg/dL. What changed:  how much to  take   levothyroxine 200 MCG tablet Commonly known as:  SYNTHROID, LEVOTHROID Take 1 tablet (200 mcg total) by mouth daily. brfore breakfast What changed:    medication strength  how much to take   losartan 100 MG tablet Commonly known as:  COZAAR TAKE ONE (1) TABLET EACH DAY   metFORMIN 500 MG tablet Commonly known as:  GLUCOPHAGE TAKE ONE TABLET BY MOUTH TWICE A DAY WITH A MEAL   nitroGLYCERIN 0.4 MG SL tablet Commonly known as:  NITROSTAT Place 1 tablet (0.4 mg total) under the tongue every 5 (five) minutes as needed for chest pain.   potassium chloride SA 20 MEQ tablet Commonly known as:  K-DUR,KLOR-CON TAKE ONE (1) TABLET BY MOUTH EVERY DAY   predniSONE 10 MG tablet Commonly known as:  DELTASONE Take 40mg  po daily for 2 days then 30mg  daily for 2 days then 20mg  daily for 2 days then 10mg  daily for 2 days then stop   rOPINIRole 0.5 MG tablet Commonly  known as:  REQUIP Take 0.5 mg by mouth at bedtime.   traZODone 50 MG tablet Commonly known as:  DESYREL Take 1 tablet (50 mg total) by mouth at bedtime as needed for sleep.   TYLENOL PM EXTRA STRENGTH 25-500 MG Tabs tablet Generic drug:  diphenhydramine-acetaminophen Take 1 tablet by mouth at bedtime as needed (Take as directed).            Durable Medical Equipment  (From admission, onward)        Start     Ordered   09/30/17 0829  For home use only DME oxygen  Once    Question Answer Comment  Mode or (Route) Nasal cannula   Liters per Minute 2   Frequency Continuous (stationary and portable oxygen unit needed)   Oxygen conserving device Yes   Oxygen delivery system Gas      09/30/17 0830   09/30/17 0000  DME Nebulizer machine    Question:  Patient needs a nebulizer to treat with the following condition  Answer:  Bronchitis   09/30/17 1250   09/24/17 1606  For home use only DME Walker rolling  Once    Question:  Patient needs a walker to treat with the following condition  Answer:  CHF (congestive heart failure) ([place])   09/24/17 1605      Allergies  Allergen Reactions  . Penicillins Hives    Has patient had a PCN reaction causing immediate rash, facial/tongue/throat swelling, SOB or lightheadedness with hypotension: Yes Has patient had a PCN reaction causing severe rash involving mucus membranes or skin necrosis: No Has patient had a PCN reaction that required hospitalization No Has patient had a PCN reaction occurring within the last 10 years: No If all of the above answers are "NO", then may proceed with Cephalosporin use.  [name] Percocet [Oxycodone-Acetaminophen] Nausea And Vomiting    Consultations:     Procedures/Studies: Dg Chest 2 View  Result Date: 09/28/2017 CLINICAL DATA:  Shortness of Breath EXAM: CHEST - 2 VIEW COMPARISON:  09/23/2017 FINDINGS: Improving aeration in the lungs. Mild residual interstitial prominence may reflect residual interstitial  edema. Heart is borderline in size. Left pacer is unchanged. Small left effusion. No acute bony abnormality. IMPRESSION: Improving aeration and decreasing bilateral airspace opacities. Mild residual interstitial prominence may reflect residual interstitial edema. Small left effusion. Electronically Signed   By: Rolm Baptise M.D.   On: 09/28/2017 10:52   Dg Chest 2 View  Result Date: 09/23/2017 CLINICAL DATA:  Chest pain short of breath EXAM: CHEST - 2 VIEW COMPARISON:  09/21/2017, 06/26/2016 FINDINGS: Left-sided pacing device as before. Small bilateral pleural effusions. Cardiomegaly with worsened vascular congestion and development of moderate interstitial opacity consistent with edema. Aortic atherosclerosis. No pneumothorax. IMPRESSION: 1. Cardiomegaly with development of vascular congestion and moderate diffuse interstitial opacity consistent with pulmonary edema. Small pleural effusions. Electronically Signed   By: Donavan Foil M.D.   On: 09/23/2017 20:49   Dg Chest 2 View  Result Date: 09/21/2017 CLINICAL DATA:  72 year old female with a history of shortness of breath EXAM: CHEST - 2 VIEW COMPARISON:  06/26/2016, 06/26/2016 FINDINGS: Cardiomediastinal silhouette unchanged in size and contour. No evidence of central vascular congestion. Similar appearance of asymmetric elevation of the right hemidiaphragm. No pneumothorax. Blunting of the left costophrenic angle with suspicion of meniscus on the lateral view compatible with small pleural fluid. Unchanged left chest wall cardiac pacing device with 2 leads in place. No confluent airspace disease. Degenerative changes of the spine. IMPRESSION: Small left-sided pleural effusion. Unchanged cardiac pacing device. Electronically Signed   By: Corrie Mckusick D.O.   On: 09/21/2017 16:59       Subjective: Feeling better.  Shortness of breath improving cough is nonproductive.  Discharge Exam: Vitals:   09/30/17 0740 09/30/17 1224  BP:    Pulse:  69   Resp:    Temp:    SpO2: 99% 93%   Vitals:   09/30/17 0451 09/30/17 0735 09/30/17 0740 09/30/17 1224  BP: (!) 139/57     Pulse: 63   69  Resp:      Temp: 98.5 F (36.9 C)     TempSrc: Oral     SpO2: 93% 99% 99% 93%  Weight: 72.6 kg (160 lb 0.9 oz)     Height:        General: Pt is alert, awake, not in acute distress Cardiovascular: RRR, S1/S2 +, no rubs, no gallops Respiratory: CTA bilaterally, no wheezing, no rhonchi Abdominal: Soft, NT, ND, bowel sounds + Extremities: no edema, no cyanosis    The results of significant diagnostics from this hospitalization (including imaging, microbiology, ancillary and laboratory) are listed below for reference.     Microbiology: Recent Results (from the past 240 hour(s))  Culture, blood (routine x 2)     Status: None   Collection Time: 09/23/17  7:33 PM  Result Value Ref Range Status   Specimen Description LEFT ANTECUBITAL DRAWN BY RN  Final   Special Requests   Final    BOTTLES DRAWN AEROBIC AND ANAEROBIC Blood Culture adequate volume   Culture   Final    NO GROWTH 5 DAYS Performed at [facility], [address] [place], [place] [address]    Report Status 09/28/2017 FINAL  Final  Culture, blood (routine x 2)     Status: None   Collection Time: 09/23/17  7:41 PM  Result Value Ref Range Status   Specimen Description RIGHT ANTECUBITAL  Final   Special Requests   Final    BOTTLES DRAWN AEROBIC AND ANAEROBIC Blood Culture adequate volume   Culture   Final    NO GROWTH 5 DAYS Performed at [facility], [address], [place], [place] [address]    Report Status 09/28/2017 FINAL  Final  Respiratory Panel by PCR     Status: Abnormal   Collection Time: 09/23/17 11:20 PM  Result Value Ref Range Status   Adenovirus NOT DETECTED NOT DETECTED Final   Coronavirus 229E NOT DETECTED NOT  DETECTED Final   Coronavirus HKU1 NOT DETECTED NOT DETECTED Final   Coronavirus NL63 NOT DETECTED NOT DETECTED Final   Coronavirus OC43 DETECTED (A)  NOT DETECTED Final   Metapneumovirus NOT DETECTED NOT DETECTED Final   Rhinovirus / Enterovirus NOT DETECTED NOT DETECTED Final   Influenza A NOT DETECTED NOT DETECTED Final   Influenza B NOT DETECTED NOT DETECTED Final   Parainfluenza Virus 1 NOT DETECTED NOT DETECTED Final   Parainfluenza Virus 2 NOT DETECTED NOT DETECTED Final   Parainfluenza Virus 3 NOT DETECTED NOT DETECTED Final   Parainfluenza Virus 4 NOT DETECTED NOT DETECTED Final   Respiratory Syncytial Virus NOT DETECTED NOT DETECTED Final   Bordetella pertussis NOT DETECTED NOT DETECTED Final   Chlamydophila pneumoniae NOT DETECTED NOT DETECTED Final   Mycoplasma pneumoniae NOT DETECTED NOT DETECTED Final    Comment: Performed at [facility], [place] [address] [place], [place] [address]     Labs: BNP (last 3 results) Recent Labs    09/23/17 1941  BNP 621.3*   Basic Metabolic Panel: Recent Labs  Lab 09/25/17 0627 09/26/17 0616 09/28/17 0441 09/29/17 0552 09/29/17 1233 09/30/17 0707  NA 135 131* 134* 136  --  138  K 4.3 4.6 4.8 4.2  --  3.9  CL 100* 97* 98* 96*  --  99*  CO2 21* 22 24 30   --  30  GLUCOSE 262* 269* 340* 289* 387* 183*  BUN 38* 41* 41* 33*  --  38*  CREATININE 1.44* 1.27* 1.39* 1.12*  --  1.26*  CALCIUM 8.7* 9.2 8.7* 8.6*  --  8.2*   Liver Function Tests: Recent Labs  Lab 09/23/17 1940  AST 35  ALT 65*  ALKPHOS 64  BILITOT 1.6*  PROT 6.7  ALBUMIN 3.3*   No results for input(s): LIPASE, AMYLASE in the last 168 hours. No results for input(s): AMMONIA in the last 168 hours. CBC: Recent Labs  Lab 09/23/17 1940 09/24/17 0007 09/25/17 0627 09/29/17 0552  WBC 15.8* 14.7* 12.2* 14.9*  NEUTROABS 14.1* 12.9*  --   --   HGB 9.7* 9.1* 8.9* 9.6*  HCT 29.9* 28.2* 27.9* 30.5*  MCV 92.3 92.5 92.7 92.4  PLT 309 302 301 377   Cardiac Enzymes: Recent Labs  Lab 09/24/17 0007 09/24/17 0559  TROPONINI 0.03* 0.03*   BNP: Invalid input(s): POCBNP CBG: Recent Labs  Lab 09/29/17 1656  09/29/17 2131 09/30/17 0302 09/30/17 0733 09/30/17 1113  GLUCAP 366* 326* 205* 170* 322*   D-Dimer No results for input(s): DDIMER in the last 72 hours. Hgb A1c No results for input(s): HGBA1C in the last 72 hours. Lipid Profile No results for input(s): CHOL, HDL, LDLCALC, TRIG, CHOLHDL, LDLDIRECT in the last 72 hours. Thyroid function studies No results for input(s): TSH, T4TOTAL, T3FREE, THYROIDAB in the last 72 hours.  Invalid input(s): FREET3 Anemia work up No results for input(s): VITAMINB12, FOLATE, FERRITIN, TIBC, IRON, RETICCTPCT in the last 72 hours. Urinalysis    Component Value Date/Time   COLORURINE STRAW (A) 09/17/2015 0001   APPEARANCEUR HAZY (A) 09/17/2015 0001   APPEARANCEUR Cloudy (A) 07/03/2015 1530   LABSPEC 1.010 09/17/2015 0001   PHURINE 5.0 09/17/2015 0001   GLUCOSEU >1000 (A) 09/17/2015 0001   HGBUR NEGATIVE 09/17/2015 0001   BILIRUBINUR NEGATIVE 09/17/2015 0001   BILIRUBINUR Negative 07/03/2015 1530   KETONESUR NEGATIVE 09/17/2015 0001   PROTEINUR NEGATIVE 09/17/2015 0001   UROBILINOGEN 0.2 02/15/2015 1335   NITRITE NEGATIVE 09/17/2015 0001   LEUKOCYTESUR  NEGATIVE 09/17/2015 0001   LEUKOCYTESUR 1+ (A) 07/03/2015 1530   Sepsis Labs Invalid input(s): PROCALCITONIN,  WBC,  LACTICIDVEN Microbiology Recent Results (from the past 240 hour(s))  Culture, blood (routine x 2)     Status: None   Collection Time: 09/23/17  7:33 PM  Result Value Ref Range Status   Specimen Description LEFT ANTECUBITAL DRAWN BY RN  Final   Special Requests   Final    BOTTLES DRAWN AEROBIC AND ANAEROBIC Blood Culture adequate volume   Culture   Final    NO GROWTH 5 DAYS Performed at [facility], [address] [place], [place] [address]    Report Status 09/28/2017 FINAL  Final  Culture, blood (routine x 2)     Status: None   Collection Time: 09/23/17  7:41 PM  Result Value Ref Range Status   Specimen Description RIGHT ANTECUBITAL  Final   Special Requests   Final     BOTTLES DRAWN AEROBIC AND ANAEROBIC Blood Culture adequate volume   Culture   Final    NO GROWTH 5 DAYS Performed at [facility], [address] [place], [place] [address]    Report Status 09/28/2017 FINAL  Final  Respiratory Panel by PCR     Status: Abnormal   Collection Time: 09/23/17 11:20 PM  Result Value Ref Range Status   Adenovirus NOT DETECTED NOT DETECTED Final   Coronavirus 229E NOT DETECTED NOT DETECTED Final   Coronavirus HKU1 NOT DETECTED NOT DETECTED Final   Coronavirus NL63 NOT DETECTED NOT DETECTED Final   Coronavirus OC43 DETECTED (A) NOT DETECTED Final   Metapneumovirus NOT DETECTED NOT DETECTED Final   Rhinovirus / Enterovirus NOT DETECTED NOT DETECTED Final   Influenza A NOT DETECTED NOT DETECTED Final   Influenza B NOT DETECTED NOT DETECTED Final   Parainfluenza Virus 1 NOT DETECTED NOT DETECTED Final   Parainfluenza Virus 2 NOT DETECTED NOT DETECTED Final   Parainfluenza Virus 3 NOT DETECTED NOT DETECTED Final   Parainfluenza Virus 4 NOT DETECTED NOT DETECTED Final   Respiratory Syncytial Virus NOT DETECTED NOT DETECTED Final   Bordetella pertussis NOT DETECTED NOT DETECTED Final   Chlamydophila pneumoniae NOT DETECTED NOT DETECTED Final   Mycoplasma pneumoniae NOT DETECTED NOT DETECTED Final    Comment: Performed at [facility], [place] [address] [place],  [address]     Time coordinating discharge: 76mins  SIGNED:   Kathie Dike, MD  [facility] 09/30/2017, 12:51 PM Pager   If 7PM-7AM, please contact night-coverage [email or web address] Password [email or web address]

## 2017-09-30 NOTE — Care Management Note (Signed)
Case Management Note  Patient Details  Name: Virginia Huber MRN: [id number] Date of Birth: [birth year]   If discussed at Long Length of Stay Meetings, dates discussed:  09/30/2017  Additional Comments:  [place] Brue, Chauncey Reading, RN 09/30/2017, 12:03 PM

## 2017-09-30 NOTE — Care Management (Signed)
Patient qualifies for home oxygen. Offered choice. She has received RW from [facility] and will have [place] [facility] with [facility]. She would like her oxygen with [facility]. Juliann Pulse of [facility] notified and will obtain orders and deliver portable tank to room today.

## 2017-10-01 DIAGNOSIS — E039 Hypothyroidism, unspecified: Secondary | ICD-10-CM | POA: Diagnosis not present

## 2017-10-01 DIAGNOSIS — L931 Subacute cutaneous lupus erythematosus: Secondary | ICD-10-CM | POA: Diagnosis not present

## 2017-10-01 DIAGNOSIS — N183 Chronic kidney disease, stage 3 (moderate): Secondary | ICD-10-CM | POA: Diagnosis not present

## 2017-10-01 DIAGNOSIS — J159 Unspecified bacterial pneumonia: Secondary | ICD-10-CM | POA: Diagnosis not present

## 2017-10-01 DIAGNOSIS — E1165 Type 2 diabetes mellitus with hyperglycemia: Secondary | ICD-10-CM | POA: Diagnosis not present

## 2017-10-01 DIAGNOSIS — I480 Paroxysmal atrial fibrillation: Secondary | ICD-10-CM | POA: Diagnosis not present

## 2017-10-01 DIAGNOSIS — Z7901 Long term (current) use of anticoagulants: Secondary | ICD-10-CM | POA: Diagnosis not present

## 2017-10-01 DIAGNOSIS — K219 Gastro-esophageal reflux disease without esophagitis: Secondary | ICD-10-CM | POA: Diagnosis not present

## 2017-10-01 DIAGNOSIS — I2510 Atherosclerotic heart disease of native coronary artery without angina pectoris: Secondary | ICD-10-CM | POA: Diagnosis not present

## 2017-10-01 DIAGNOSIS — J208 Acute bronchitis due to other specified organisms: Secondary | ICD-10-CM | POA: Diagnosis not present

## 2017-10-01 DIAGNOSIS — Z8673 Personal history of transient ischemic attack (TIA), and cerebral infarction without residual deficits: Secondary | ICD-10-CM | POA: Diagnosis not present

## 2017-10-01 DIAGNOSIS — Z794 Long term (current) use of insulin: Secondary | ICD-10-CM | POA: Diagnosis not present

## 2017-10-01 DIAGNOSIS — J9601 Acute respiratory failure with hypoxia: Secondary | ICD-10-CM | POA: Diagnosis not present

## 2017-10-01 DIAGNOSIS — I252 Old myocardial infarction: Secondary | ICD-10-CM | POA: Diagnosis not present

## 2017-10-01 DIAGNOSIS — E663 Overweight: Secondary | ICD-10-CM | POA: Diagnosis not present

## 2017-10-01 DIAGNOSIS — Z7902 Long term (current) use of antithrombotics/antiplatelets: Secondary | ICD-10-CM | POA: Diagnosis not present

## 2017-10-01 DIAGNOSIS — I5033 Acute on chronic diastolic (congestive) heart failure: Secondary | ICD-10-CM | POA: Diagnosis not present

## 2017-10-01 DIAGNOSIS — I130 Hypertensive heart and chronic kidney disease with heart failure and stage 1 through stage 4 chronic kidney disease, or unspecified chronic kidney disease: Secondary | ICD-10-CM | POA: Diagnosis not present

## 2017-10-01 DIAGNOSIS — G4733 Obstructive sleep apnea (adult) (pediatric): Secondary | ICD-10-CM | POA: Diagnosis not present

## 2017-10-01 DIAGNOSIS — E1122 Type 2 diabetes mellitus with diabetic chronic kidney disease: Secondary | ICD-10-CM | POA: Diagnosis not present

## 2017-10-01 DIAGNOSIS — Z950 Presence of cardiac pacemaker: Secondary | ICD-10-CM | POA: Diagnosis not present

## 2017-10-01 DIAGNOSIS — E1151 Type 2 diabetes mellitus with diabetic peripheral angiopathy without gangrene: Secondary | ICD-10-CM | POA: Diagnosis not present

## 2017-10-04 ENCOUNTER — Other Ambulatory Visit: Payer: Self-pay

## 2017-10-04 DIAGNOSIS — S92414A Nondisplaced fracture of proximal phalanx of right great toe, initial encounter for closed fracture: Secondary | ICD-10-CM | POA: Diagnosis not present

## 2017-10-04 NOTE — Patient Outreach (Signed)
[place] [facility]) Care Management  10/04/2017  Virginia Huber December 08, 1945 [id number]  EMMI:  General discharge Referral date: 10/04/17 Referral reason: Got discharge paper: NO Day # 1  Telephone call to patient regarding EMMI general discharge. Unable to reach. HIPAA compliant voice message left with call back phone number.   PLAN: RNCM will attempt 2nd telephone call to patient within 4 business days.  RNCM will send patient outreach letter to attempt contact.   Quinn Plowman RN,BSN,CCM [facility] Telephonic  [phone]

## 2017-10-05 DIAGNOSIS — J9601 Acute respiratory failure with hypoxia: Secondary | ICD-10-CM | POA: Diagnosis not present

## 2017-10-05 DIAGNOSIS — I509 Heart failure, unspecified: Secondary | ICD-10-CM | POA: Diagnosis not present

## 2017-10-05 DIAGNOSIS — E663 Overweight: Secondary | ICD-10-CM | POA: Diagnosis not present

## 2017-10-05 DIAGNOSIS — Z6829 Body mass index (BMI) 29.0-29.9, adult: Secondary | ICD-10-CM | POA: Diagnosis not present

## 2017-10-05 DIAGNOSIS — I2510 Atherosclerotic heart disease of native coronary artery without angina pectoris: Secondary | ICD-10-CM | POA: Diagnosis not present

## 2017-10-05 DIAGNOSIS — E039 Hypothyroidism, unspecified: Secondary | ICD-10-CM | POA: Diagnosis not present

## 2017-10-05 DIAGNOSIS — I10 Essential (primary) hypertension: Secondary | ICD-10-CM | POA: Diagnosis not present

## 2017-10-05 DIAGNOSIS — E119 Type 2 diabetes mellitus without complications: Secondary | ICD-10-CM | POA: Diagnosis not present

## 2017-10-05 DIAGNOSIS — I4891 Unspecified atrial fibrillation: Secondary | ICD-10-CM | POA: Diagnosis not present

## 2017-10-05 LAB — CUP PACEART REMOTE DEVICE CHECK
Brady Statistic AP VS Percent: 96.82 %
Brady Statistic AS VP Percent: 0 %
Brady Statistic AS VS Percent: 3.12 %
Date Time Interrogation Session: 20190404131227
Implantable Lead Implant Date: 20180104
Implantable Lead Location: 753860
Implantable Lead Model: 5076
Implantable Lead Model: 5076
Implantable Pulse Generator Implant Date: 20180104
Lead Channel Impedance Value: 323 Ohm
Lead Channel Impedance Value: 475 Ohm
Lead Channel Pacing Threshold Amplitude: 1.125 V
Lead Channel Pacing Threshold Pulse Width: 0.4 ms
Lead Channel Sensing Intrinsic Amplitude: 1 mV
Lead Channel Sensing Intrinsic Amplitude: 9.75 mV
Lead Channel Sensing Intrinsic Amplitude: 9.75 mV
Lead Channel Setting Pacing Amplitude: 2.5 V
Lead Channel Setting Pacing Pulse Width: 0.4 ms
MDC IDC LEAD IMPLANT DT: 20180104
MDC IDC LEAD LOCATION: 753859
MDC IDC MSMT BATTERY REMAINING LONGEVITY: 109 mo
MDC IDC MSMT BATTERY VOLTAGE: 3.02 V
MDC IDC MSMT LEADCHNL RA PACING THRESHOLD AMPLITUDE: 0.75 V
MDC IDC MSMT LEADCHNL RA PACING THRESHOLD PULSEWIDTH: 0.4 ms
MDC IDC MSMT LEADCHNL RA SENSING INTR AMPL: 1 mV
MDC IDC MSMT LEADCHNL RV IMPEDANCE VALUE: 323 Ohm
MDC IDC MSMT LEADCHNL RV IMPEDANCE VALUE: 399 Ohm
MDC IDC SET LEADCHNL RA PACING AMPLITUDE: 2 V
MDC IDC SET LEADCHNL RV SENSING SENSITIVITY: 2.8 mV
MDC IDC STAT BRADY AP VP PERCENT: 0.06 %
MDC IDC STAT BRADY RA PERCENT PACED: 96.87 %
MDC IDC STAT BRADY RV PERCENT PACED: 0.06 %

## 2017-10-06 ENCOUNTER — Ambulatory Visit: Payer: PPO | Admitting: Cardiovascular Disease

## 2017-10-06 ENCOUNTER — Encounter: Payer: Self-pay | Admitting: Cardiovascular Disease

## 2017-10-06 VITALS — BP 122/58 | HR 74 | Ht 63.0 in | Wt 161.0 lb

## 2017-10-06 DIAGNOSIS — I25118 Atherosclerotic heart disease of native coronary artery with other forms of angina pectoris: Secondary | ICD-10-CM | POA: Diagnosis not present

## 2017-10-06 DIAGNOSIS — E785 Hyperlipidemia, unspecified: Secondary | ICD-10-CM | POA: Diagnosis not present

## 2017-10-06 DIAGNOSIS — E119 Type 2 diabetes mellitus without complications: Secondary | ICD-10-CM | POA: Diagnosis not present

## 2017-10-06 DIAGNOSIS — I5032 Chronic diastolic (congestive) heart failure: Secondary | ICD-10-CM

## 2017-10-06 DIAGNOSIS — E113411 Type 2 diabetes mellitus with severe nonproliferative diabetic retinopathy with macular edema, right eye: Secondary | ICD-10-CM | POA: Diagnosis not present

## 2017-10-06 DIAGNOSIS — H33301 Unspecified retinal break, right eye: Secondary | ICD-10-CM | POA: Diagnosis not present

## 2017-10-06 DIAGNOSIS — Z79899 Other long term (current) drug therapy: Secondary | ICD-10-CM

## 2017-10-06 DIAGNOSIS — G4733 Obstructive sleep apnea (adult) (pediatric): Secondary | ICD-10-CM | POA: Diagnosis not present

## 2017-10-06 DIAGNOSIS — H34831 Tributary (branch) retinal vein occlusion, right eye, with macular edema: Secondary | ICD-10-CM | POA: Diagnosis not present

## 2017-10-06 DIAGNOSIS — Z955 Presence of coronary angioplasty implant and graft: Secondary | ICD-10-CM | POA: Diagnosis not present

## 2017-10-06 DIAGNOSIS — Z950 Presence of cardiac pacemaker: Secondary | ICD-10-CM

## 2017-10-06 DIAGNOSIS — H43811 Vitreous degeneration, right eye: Secondary | ICD-10-CM | POA: Diagnosis not present

## 2017-10-06 DIAGNOSIS — I10 Essential (primary) hypertension: Secondary | ICD-10-CM

## 2017-10-06 NOTE — Progress Notes (Signed)
SUBJECTIVE: The patient presents for posthospitalization follow-up.  She was discharged on 09/30/17 for acute on chronic diastolic heart failure and probable acute bronchitis.  Lasix was increased from 40 to 60 mg daily. Respiratory viral panel was positive for coronavirus.  She was treated with antibiotics, intravenous steroids and bronchodilators.  She underwent pacemaker placement for symptomatic bradycardia in January 2018. This led to pericardial tamponadeand she underwent pericardiocentesis with removal of 450 mL of bloody fluid.  Echocardiogram 08/04/17 demonstrated normal left ventricular systolic function and regional wall motion, LVEF 55-60%, moderate LVH, grade 2 diastolic dysfunction with high ventricular filling pressures, and severe left atrial dilatation.  Pulmonary function testing on 07/27/17 showed no significant obstructive lung disease with only a mild ventilatory defect.  She feels weak but she is less short of breath and is no longer wheezing.  She sleeps with oxygen.  Her O2 saturations are 96% on room air in our office.  Her discharge weight was 160 pounds in the hospital and she weighs 161 pounds on our office scales.  She denies chest pain and palpitations.    Review of Systems: As per "subjective", otherwise negative.  Allergies  Allergen Reactions  . Penicillins Hives    Has patient had a PCN reaction causing immediate rash, facial/tongue/throat swelling, SOB or lightheadedness with hypotension: Yes Has patient had a PCN reaction causing severe rash involving mucus membranes or skin necrosis: No Has patient had a PCN reaction that required hospitalization No Has patient had a PCN reaction occurring within the last 10 years: No If all of the above answers are "NO", then may proceed with Cephalosporin use.  [name] Percocet [Oxycodone-Acetaminophen] Nausea And Vomiting    Current Outpatient Medications  Medication Sig Dispense Refill  . acetaminophen (TYLENOL)  325 MG tablet Take 650 mg by mouth every 6 (six) hours as needed for headache.    . albuterol (PROVENTIL HFA;VENTOLIN HFA) 108 (90 Base) MCG/ACT inhaler Inhale 2 puffs into the lungs every 6 (six) hours as needed for wheezing or shortness of breath. 1 Inhaler 2  . albuterol (PROVENTIL) (2.5 MG/3ML) 0.083% nebulizer solution Take 3 mLs (2.5 mg total) by nebulization every 6 (six) hours as needed for wheezing or shortness of breath. 75 mL 12  . amiodarone (PACERONE) 200 MG tablet Take 1 tablet (200 mg total) by mouth daily. 30 tablet 6  . apixaban (ELIQUIS) 5 MG TABS tablet TAKE (1) TABLET BY MOUTH TWICE DAILY. 180 tablet 3  . atorvastatin (LIPITOR) 80 MG tablet Take 1 tablet (80 mg total) by mouth daily at 6 PM. 30 tablet 6  . carvedilol (COREG) 25 MG tablet Take 1 tablet (25 mg total) by mouth 2 (two) times daily. 180 tablet 3  . cholecalciferol (VITAMIN D) 1000 UNITS tablet Take 50,000 Units by mouth once a week.     . clopidogrel (PLAVIX) 75 MG tablet TAKE ONE (1) TABLET BY MOUTH EVERY DAY 90 tablet 3  . diltiazem (CARDIZEM CD) 360 MG 24 hr capsule Take 1 capsule (360 mg total) by mouth daily. 90 capsule 3  . diphenhydramine-acetaminophen (TYLENOL PM EXTRA STRENGTH) 25-500 MG TABS tablet Take 1 tablet by mouth at bedtime as needed (Take as directed).     . furosemide (LASIX) 40 MG tablet Take 1.5 tablets (60 mg total) by mouth daily. 90 tablet 3  . glucose blood (FREESTYLE LITE) test strip USE AS DIRECTED TWICE DAILY. 100 each 5  . insulin NPH-regular Human (NOVOLIN 70/30) (70-30) 100 UNIT/ML injection  Inject 40 Units into the skin See admin instructions. Inject 25 units before breakfast and 25 units before supper  - only if pre-meal blood glucose is above 90 mg/dL. 10 mL 11  . levothyroxine (SYNTHROID, LEVOTHROID) 200 MCG tablet Take 1 tablet (200 mcg total) by mouth daily. brfore breakfast 30 tablet 0  . losartan (COZAAR) 100 MG tablet TAKE ONE (1) TABLET EACH DAY 90 tablet 3  . metFORMIN  (GLUCOPHAGE) 500 MG tablet TAKE ONE TABLET BY MOUTH TWICE A DAY WITH A MEAL 180 tablet 0  . nitroGLYCERIN (NITROSTAT) 0.4 MG SL tablet Place 1 tablet (0.4 mg total) under the tongue every 5 (five) minutes as needed for chest pain. 25 tablet 12  . potassium chloride SA (K-DUR,KLOR-CON) 20 MEQ tablet TAKE ONE (1) TABLET BY MOUTH EVERY DAY 90 tablet 3  . predniSONE (DELTASONE) 10 MG tablet Take 40mg  po daily for 2 days then 30mg  daily for 2 days then 20mg  daily for 2 days then 10mg  daily for 2 days then stop 20 tablet 0  . rOPINIRole (REQUIP) 0.5 MG tablet Take 0.5 mg by mouth at bedtime.   0  . traZODone (DESYREL) 50 MG tablet Take 1 tablet (50 mg total) by mouth at bedtime as needed for sleep. 30 tablet 0   No current facility-administered medications for this visit.     Past Medical History:  Diagnosis Date  . Arthritis   . Atrial fibrillation and flutter ([place])    a. h/o PAF/flutter during admission in 2013 for PNA. b. PAF during adm for NSTEMI 07/2015, subsequent paroxysms since then.  . B12 deficiency anemia   . Cardiac tamponade 06/2016  . Coronary artery disease 11/30/2014   a. remote MI. b. h/o PTCA with scoring balloon to OM1 11/2014. c. NSTEMI 03/2015 s/p DES to prox-mid Cx. d. NSTEMI 07/2015 s/p scoring balloon/PTCA/DES to dRCA with PAF during that admission  . Cutaneous lupus erythematosus   . GERD (gastroesophageal reflux disease)   . Heart block   . History of blood transfusion 1980's   2nd surgical procedures  . HTN (hypertension)   . Hypercholesteremia   . Hypothyroidism   . Myocardial infarction ([place]) 02/2012  . Ovarian tumor   . PAD (peripheral artery disease) ([place])    a. s/p LE angio 2015; followed by Dr. Fletcher Anon - managed medically.  . Pain with urination 05/08/2015  . Paroxysmal atrial flutter ([place])   . Pericardial effusion    a. 06/2016 after ppm - s/p pericardiocentesis.  . Superficial fungus infection of skin 06/29/2013  . Tachy-brady syndrome ([place])    a. s/p  Medtronic PPM 06/2016, c/b lead perf/pericardial effusion.  [name] TIA (transient ischemic attack) 08/2001; ~ 2006  . Type II diabetes mellitus ([place])   . UTI (urinary tract infection) 05/08/2013    Past Surgical History:  Procedure Laterality Date  . ABDOMINAL AORTAGRAM N/A 01/03/2014   Procedure: ABDOMINAL [name];  Surgeon: Wellington Hampshire, MD;  Location: [facility] CATH [facility];  Service: Cardiovascular;  Laterality: N/A;  . ABDOMINAL HYSTERECTOMY  1972   "partial"  . APPENDECTOMY  1970's  . CARDIAC CATHETERIZATION  2008   Tiny OM-2 with 90% narrowing. Med tx.  [name] CARDIAC CATHETERIZATION N/A 11/30/2014   Procedure: Left Heart Cath and Coronary Angiography;  Surgeon: [place] Sine, MD; LAD 20%, CFX 50%, OM1 95%, right PLB 30%, LV normal   . CARDIAC CATHETERIZATION N/A 11/30/2014   Procedure: Coronary Balloon Angioplasty;  Surgeon: [place] Sine, MD;  Angiosculpt scoring balloon  and PTCA to the OM1 reducing stenosis from 95% to less than 10%  . CARDIAC CATHETERIZATION N/A 04/03/2015   Procedure: Left Heart Cath and Coronary Angiography;  Surgeon: Jolaine Artist, MD; dLAD 50%, CFX 90%, OM1 100%, PLA 15%, LVEDP 13    . CARDIAC CATHETERIZATION N/A 04/03/2015   Procedure: Coronary Stent Intervention;  Surgeon: Sherren Mocha, MD; 3.0x18 mm Xience DES to the CFX    . CARDIAC CATHETERIZATION N/A 08/02/2015   Procedure: Left Heart Cath and Coronary Angiography;  Surgeon: [place] Sine, MD;  Location: [facility];  Service: Cardiovascular;  Laterality: N/A;  . CARDIAC CATHETERIZATION N/A 08/02/2015   Procedure: Coronary Stent Intervention;  Surgeon: [place] Sine, MD;  Location: [facility];  Service: Cardiovascular;  Laterality: N/A;  . CARDIAC CATHETERIZATION N/A 06/25/2016   Procedure: Pericardiocentesis;  Surgeon: Will Meredith Leeds, MD;  Location: [facility];  Service: Cardiovascular;  Laterality: N/A;  . cardiac stents    . CHOLECYSTECTOMY OPEN  1990's  . COLONOSCOPY  2005   Dr.  Laural Golden: pancolonic divericula, polyp, path unknown currently  . COLONOSCOPY  2012   Dr. Oneida Alar: Normal TI, scattered diverticula in entire colon, small internal hemorrhoids, normal colon biopsies. Colonoscopy in 5-10 years.   . COLOSTOMY  05/1979  . COLOSTOMY REVERSAL  11/1979  . EP IMPLANTABLE DEVICE N/A 06/25/2016   Procedure: Lead Revision/Repair;  Surgeon: Will Meredith Leeds, MD;  Location: [facility] CV [facility];  Service: Cardiovascular;  Laterality: N/A;  . EP IMPLANTABLE DEVICE N/A 06/25/2016   Procedure: Pacemaker Implant;  Surgeon: Will Meredith Leeds, MD;  Location: [facility];  Service: Cardiovascular;  Laterality: N/A;  . EXCISIONAL HEMORRHOIDECTOMY  1970's  . EYE SURGERY Left 2000   "branch vein occlusion"  . EYE SURGERY Left ~ 2001   "smoothed out wrinkle"  . LEFT OOPHORECTOMY  05/1979   nicked bowel, peritonitis, colostomy; colostomy reversed 1981   . LOWER EXTREMITY ANGIOGRAM N/A 01/03/2014   Procedure: LOWER EXTREMITY ANGIOGRAM;  Surgeon: Wellington Hampshire, MD;  Location: [facility];  Service: Cardiovascular;  Laterality: N/A;  . Nuclear med stress test  10/2011   Small area of mild ischemia inferoapically.  [name] PARTIAL HYSTERECTOMY  1970's   left ovaries, then ovaries removed later due tumors   . RIGHT OOPHORECTOMY  1970's    Social History   Socioeconomic History  . Marital status: Married    Spouse name: Not on file  . Number of children: Not on file  . Years of education: Not on file  . Highest education level: Not on file  Occupational History  . Occupation: Retired    Fish farm manager: RETIRED    Comment: Research officer, political party  Social Needs  . Financial resource strain: Not on file  . Food insecurity:    Worry: Not on file    Inability: Not on file  . Transportation needs:    Medical: Not on file    Non-medical: Not on file  Tobacco Use  . Smoking status: Never Smoker  . Smokeless tobacco: Never Used  . Tobacco comment: Never smoked  Substance and Sexual  Activity  . Alcohol use: No    Alcohol/week: 0.0 oz  . Drug use: No  . Sexual activity: Never    Birth control/protection: Surgical    Comment: hyst  Lifestyle  . Physical activity:    Days per week: Not on file    Minutes per session: Not on file  . Stress:  Not on file  Relationships  . Social connections:    Talks on phone: Not on file    Gets together: Not on file    Attends religious service: Not on file    Active member of club or organization: Not on file    Attends meetings of clubs or organizations: Not on file    Relationship status: Not on file  . Intimate partner violence:    Fear of current or ex partner: Not on file    Emotionally abused: Not on file    Physically abused: Not on file    Forced sexual activity: Not on file  Other Topics Concern  . Not on file  Social History Narrative  . Not on file     Vitals:   10/06/17 1252  BP: (!) 122/58  Pulse: 74  SpO2: 96%  Weight: 161 lb (73 kg)  Height: 5\' 3"  (1.6 m)    Wt Readings from Last 3 Encounters:  10/06/17 161 lb (73 kg)  09/30/17 160 lb 0.9 oz (72.6 kg)  08/12/17 158 lb (71.7 kg)     PHYSICAL EXAM General: NAD HEENT: Normal. Neck: No JVD, no thyromegaly. Lungs: Clear to auscultation bilaterally with normal respiratory effort. CV: Regular rate and rhythm, normal S1/S2, no S3/S4, no murmur. Trivial bilateral periankle edema.    Abdomen: Soft, nontender, no distention.  Neurologic: Alert and oriented.  Psych: Normal affect. Skin: Normal. Musculoskeletal: No gross deformities.    ECG: Most recent ECG reviewed.   Labs: Lab Results  Component Value Date/Time   K 3.9 09/30/2017 07:07 AM   BUN 38 (H) 09/30/2017 07:07 AM   BUN 21 06/23/2016 03:50 PM   CREATININE 1.26 (H) 09/30/2017 07:07 AM   CREATININE 1.24 (H) 08/05/2017 08:07 AM   ALT 65 (H) 09/23/2017 07:40 PM   TSH 11.574 (H) 09/23/2017 10:12 PM   TSH 12.65 (H) 08/05/2017 08:07 AM   HGB 9.6 (L) 09/29/2017 05:52 AM   HGB 12.4  06/23/2016 03:50 PM     Lipids: Lab Results  Component Value Date/Time   LDLCALC 96 08/05/2017 08:07 AM   CHOL 166 08/05/2017 08:07 AM   TRIG 160 (H) 08/05/2017 08:07 AM   HDL 43 (L) 08/05/2017 08:07 AM       ASSESSMENT AND PLAN:  1. CAD with prior PCI's: Symptomatically stable. Continue Lipitor, carvedilol,andPlavix.  2.MalignantHTN: Controlled.  No changes to therapy.  3. Hyperlipidemia: Continue Lipitor 80 mg daily. Also takes fish oil.  4. Chronic diastolic heart failure: Euvolemic. Continue Lasix 60 mg daily. I instructed her to take an extra 40 mg Lasix if weight should increase by 3 pounds in 24 hours or 5 pounds in a week.  Also instructed her to increase potassium on those days as well.  5. Atrial fibrillation: Currently ina regularrhythm. Continue amiodarone and Eliquis as well as long-acting diltiazem.  TSH up to 11.5 on 09/23/17.  Levothyroxine was increased while hospitalized.   AST 35 and ALT elevated up to 65 on 09/23/17.  6. Pacemaker: Normal function.  7. TLX:BWIOMB driving hypertension.She was not able to tolerate CPAP. I previously asked her to speak with her sleep specialist about obtaining an oralmandibular advancement appliance.     Disposition: Follow up 3 months   Kate Sable, M.D., F.A.C.C.

## 2017-10-06 NOTE — Patient Instructions (Addendum)
Your physician wants you to follow-up in:3 months  with Dr Bronson Ing       Your physician recommends that you continue on your current medications as directed. Please refer to the Current Medication list given to you today.    If you need a refill on your cardiac medications before your next appointment, please call your pharmacy.    No lab work or tests ordered today.      Thank you for choosing [facility] !

## 2017-10-07 ENCOUNTER — Other Ambulatory Visit: Payer: Self-pay

## 2017-10-07 DIAGNOSIS — J9601 Acute respiratory failure with hypoxia: Secondary | ICD-10-CM | POA: Diagnosis not present

## 2017-10-07 NOTE — Patient Outreach (Signed)
[facility]) Care Management  10/07/2017  ORELIA BRANDSTETTER 19-Dec-1945 [id number]    EMMI:  General discharge Referral date: 10/04/17 Referral reason: Got discharge paper: NO Day # 1  Telephone call to patient regarding EMMI general discharge. Unable to reach. HIPAA compliant voice message left with call back phone number.   PLAN: RNCM will attempt 3rd  telephone call to patient within 4 business days.    Quinn Plowman RN,BSN,CCM [facility] Telephonic  [phone]

## 2017-10-07 NOTE — Patient Outreach (Signed)
[facility]) Care Management  10/07/2017  KONSTANTINA NACHREINER 1946/05/18 [id number]   EMMI:  General discharge Referral date: 10/04/17 Referral reason: Got discharge paper: NO Day # 1  Telephone call to patient regarding EMMI general discharge.  HIPAA verified.  Explained reason for call.  Patient states she was in the hospital due to congestive heart failure.   Patient reports she received her discharge papers once  discharged from the hospital and is able to read and understand them.  Patient states she has home health services following up with her for 6 visits.   RNCM discussed and offered [facility] care management services. Patient verbally agreed to having health coach follow up. Patient states she saw her primary MD on 10/05/17 and saw her cardiologist on yesterday. Patient states she is weighing daily and recording and adhering to a low/no salt diet.   Patient reports she has had heart problems for the past 2-3 years. States she has had a couple of heart attacks, 2 stents and now has a pacemaker.  RNCM advised patient to notify MD of any changes in condition prior to scheduled appointment. RNCM provided contact name and number: [phone] or main office number [phone] and 24 hour nurse advise line [phone].  RNCM verified patient aware of 911 services for urgent/ emergent needs.   PLAN:  RNCM will refer patient to health coach.   Quinn Plowman RN,BSN,CCM [facility] Telephonic  [phone]

## 2017-10-14 ENCOUNTER — Encounter (INDEPENDENT_AMBULATORY_CARE_PROVIDER_SITE_OTHER): Payer: Self-pay

## 2017-10-14 ENCOUNTER — Ambulatory Visit: Payer: Self-pay

## 2017-10-21 ENCOUNTER — Encounter: Payer: Self-pay | Admitting: *Deleted

## 2017-10-21 ENCOUNTER — Other Ambulatory Visit: Payer: Self-pay | Admitting: *Deleted

## 2017-10-21 DIAGNOSIS — E039 Hypothyroidism, unspecified: Secondary | ICD-10-CM | POA: Diagnosis not present

## 2017-10-21 DIAGNOSIS — Z6828 Body mass index (BMI) 28.0-28.9, adult: Secondary | ICD-10-CM | POA: Diagnosis not present

## 2017-10-21 DIAGNOSIS — E663 Overweight: Secondary | ICD-10-CM | POA: Diagnosis not present

## 2017-10-21 DIAGNOSIS — E539 Vitamin B deficiency, unspecified: Secondary | ICD-10-CM | POA: Diagnosis not present

## 2017-10-21 DIAGNOSIS — I2510 Atherosclerotic heart disease of native coronary artery without angina pectoris: Secondary | ICD-10-CM | POA: Diagnosis not present

## 2017-10-21 NOTE — Patient Outreach (Signed)
[facility]) Care Management  10/21/2017  SATOMI BUDA [birth year] [id number]  1st outreach attempt to the patient for initial assessment on home number.  No answer. HIPAA compliant voicemail left with contact information.  Plan: : RN Health Coach will send unsuccessful outreach letter. RN Health Coach will make another attempt to the patient within four business days.  Barrington Ellison RN,CCM,CDE [place] Management Coordinator Office Phone [phone] Office Fax [phone]

## 2017-10-25 ENCOUNTER — Ambulatory Visit: Payer: PPO | Admitting: Cardiovascular Disease

## 2017-10-26 ENCOUNTER — Other Ambulatory Visit: Payer: Self-pay | Admitting: *Deleted

## 2017-10-26 ENCOUNTER — Ambulatory Visit: Payer: Self-pay | Admitting: *Deleted

## 2017-10-26 DIAGNOSIS — S92414A Nondisplaced fracture of proximal phalanx of right great toe, initial encounter for closed fracture: Secondary | ICD-10-CM | POA: Diagnosis not present

## 2017-10-26 NOTE — Telephone Encounter (Signed)
This encounter was created in error - please disregard.

## 2017-10-26 NOTE — Patient Outreach (Signed)
[facility]) Care Management  10/26/2017  BUNNIE REHBERG 09/26/1945 [id number]   2nd outreach attempt to the patient for initial assessment on home number. No answer. HIPAA compliant voicemail left with contact information.  Plan:: RN Health Coach mailed unsuccessful outreach letter on 10/21/17 requesting patient contact this RNCM to establish health coach for congestive heart  failure self management assistance. RN Health Coach will make another attempt to the patientwithinfour business days.  Barrington Ellison RN,CCM,CDE [place] Management Coordinator Office Phone [phone] Office Fax [phone]

## 2017-10-31 DIAGNOSIS — E663 Overweight: Secondary | ICD-10-CM | POA: Diagnosis not present

## 2017-10-31 DIAGNOSIS — J9601 Acute respiratory failure with hypoxia: Secondary | ICD-10-CM | POA: Diagnosis not present

## 2017-10-31 DIAGNOSIS — G4733 Obstructive sleep apnea (adult) (pediatric): Secondary | ICD-10-CM | POA: Diagnosis not present

## 2017-10-31 DIAGNOSIS — J159 Unspecified bacterial pneumonia: Secondary | ICD-10-CM | POA: Diagnosis not present

## 2017-11-01 ENCOUNTER — Other Ambulatory Visit: Payer: Self-pay | Admitting: *Deleted

## 2017-11-01 NOTE — Patient Outreach (Addendum)
[facility]) Care Management  11/01/2017  Virginia Huber [birth year] [id number]   3rd outreach attempt to the patient for initial assessmenton home number. No answer. HIPAA compliant voicemail left with contact information.  Plan: RN Health Coach mailed unsuccessful outreach letter on 10/21/17 requesting patient contact this RNCM to establish health coach for congestive heart  failure self management assistance.  If no response from member within 10 business days, will close case to [place] Management services.  Barrington Ellison RN,CCM,CDE [place] Management Coordinator Office Phone [phone] Office Fax [phone]

## 2017-11-08 ENCOUNTER — Other Ambulatory Visit: Payer: Self-pay | Admitting: *Deleted

## 2017-11-08 ENCOUNTER — Encounter: Payer: Self-pay | Admitting: "Endocrinology

## 2017-11-08 NOTE — Patient Outreach (Signed)
[facility]) Care Management  11/08/2017  Virginia Huber 1946/05/09 [id number]   Unable to contact patient to establish health coach prgram with her so will closecase and send unsuccessful outreach letter to patient and her provider.  Barrington Ellison RN,CCM,CDE [place] Management Coordinator Office Phone [phone] Office Fax [phone]

## 2017-11-11 DIAGNOSIS — E539 Vitamin B deficiency, unspecified: Secondary | ICD-10-CM | POA: Diagnosis not present

## 2017-11-11 DIAGNOSIS — D649 Anemia, unspecified: Secondary | ICD-10-CM | POA: Diagnosis not present

## 2017-11-11 DIAGNOSIS — E663 Overweight: Secondary | ICD-10-CM | POA: Diagnosis not present

## 2017-11-11 DIAGNOSIS — M25572 Pain in left ankle and joints of left foot: Secondary | ICD-10-CM | POA: Diagnosis not present

## 2017-11-11 DIAGNOSIS — S93402A Sprain of unspecified ligament of left ankle, initial encounter: Secondary | ICD-10-CM | POA: Diagnosis not present

## 2017-11-11 DIAGNOSIS — M79672 Pain in left foot: Secondary | ICD-10-CM | POA: Diagnosis not present

## 2017-11-11 DIAGNOSIS — I2510 Atherosclerotic heart disease of native coronary artery without angina pectoris: Secondary | ICD-10-CM | POA: Diagnosis not present

## 2017-11-11 DIAGNOSIS — E039 Hypothyroidism, unspecified: Secondary | ICD-10-CM | POA: Diagnosis not present

## 2017-11-11 DIAGNOSIS — Z6828 Body mass index (BMI) 28.0-28.9, adult: Secondary | ICD-10-CM | POA: Diagnosis not present

## 2017-11-11 DIAGNOSIS — E119 Type 2 diabetes mellitus without complications: Secondary | ICD-10-CM | POA: Diagnosis not present

## 2017-11-11 DIAGNOSIS — S92414A Nondisplaced fracture of proximal phalanx of right great toe, initial encounter for closed fracture: Secondary | ICD-10-CM | POA: Diagnosis not present

## 2017-11-11 DIAGNOSIS — S93602A Unspecified sprain of left foot, initial encounter: Secondary | ICD-10-CM | POA: Diagnosis not present

## 2017-11-12 ENCOUNTER — Ambulatory Visit: Payer: PPO | Admitting: "Endocrinology

## 2017-11-17 DIAGNOSIS — H43811 Vitreous degeneration, right eye: Secondary | ICD-10-CM | POA: Diagnosis not present

## 2017-11-17 DIAGNOSIS — H33321 Round hole, right eye: Secondary | ICD-10-CM | POA: Diagnosis not present

## 2017-11-17 DIAGNOSIS — H33301 Unspecified retinal break, right eye: Secondary | ICD-10-CM | POA: Diagnosis not present

## 2017-11-17 DIAGNOSIS — H34831 Tributary (branch) retinal vein occlusion, right eye, with macular edema: Secondary | ICD-10-CM | POA: Diagnosis not present

## 2017-11-23 DIAGNOSIS — M25572 Pain in left ankle and joints of left foot: Secondary | ICD-10-CM | POA: Diagnosis not present

## 2017-11-23 DIAGNOSIS — M25571 Pain in right ankle and joints of right foot: Secondary | ICD-10-CM | POA: Diagnosis not present

## 2017-11-24 ENCOUNTER — Ambulatory Visit: Payer: PPO | Admitting: Nutrition

## 2017-11-25 ENCOUNTER — Other Ambulatory Visit: Payer: Self-pay

## 2017-11-25 ENCOUNTER — Emergency Department (HOSPITAL_COMMUNITY)
Admission: EM | Admit: 2017-11-25 | Discharge: 2017-11-25 | Disposition: A | Discharge status: Home / Self Care | Payer: PPO | Attending: Emergency Medicine | Admitting: Emergency Medicine

## 2017-11-25 ENCOUNTER — Emergency Department (HOSPITAL_COMMUNITY): Payer: PPO

## 2017-11-25 ENCOUNTER — Encounter (HOSPITAL_COMMUNITY): Payer: Self-pay

## 2017-11-25 DIAGNOSIS — E1122 Type 2 diabetes mellitus with diabetic chronic kidney disease: Secondary | ICD-10-CM | POA: Insufficient documentation

## 2017-11-25 DIAGNOSIS — Z794 Long term (current) use of insulin: Secondary | ICD-10-CM | POA: Diagnosis not present

## 2017-11-25 DIAGNOSIS — I2510 Atherosclerotic heart disease of native coronary artery without angina pectoris: Secondary | ICD-10-CM | POA: Diagnosis not present

## 2017-11-25 DIAGNOSIS — R0789 Other chest pain: Secondary | ICD-10-CM | POA: Insufficient documentation

## 2017-11-25 DIAGNOSIS — Z79899 Other long term (current) drug therapy: Secondary | ICD-10-CM | POA: Insufficient documentation

## 2017-11-25 DIAGNOSIS — N183 Chronic kidney disease, stage 3 (moderate): Secondary | ICD-10-CM | POA: Insufficient documentation

## 2017-11-25 DIAGNOSIS — E039 Hypothyroidism, unspecified: Secondary | ICD-10-CM | POA: Insufficient documentation

## 2017-11-25 DIAGNOSIS — I5031 Acute diastolic (congestive) heart failure: Secondary | ICD-10-CM | POA: Insufficient documentation

## 2017-11-25 DIAGNOSIS — Z7902 Long term (current) use of antithrombotics/antiplatelets: Secondary | ICD-10-CM | POA: Diagnosis not present

## 2017-11-25 DIAGNOSIS — R0602 Shortness of breath: Secondary | ICD-10-CM | POA: Diagnosis not present

## 2017-11-25 DIAGNOSIS — I130 Hypertensive heart and chronic kidney disease with heart failure and stage 1 through stage 4 chronic kidney disease, or unspecified chronic kidney disease: Secondary | ICD-10-CM | POA: Insufficient documentation

## 2017-11-25 DIAGNOSIS — R079 Chest pain, unspecified: Secondary | ICD-10-CM | POA: Diagnosis not present

## 2017-11-25 LAB — CBC WITH DIFFERENTIAL/PLATELET
Basophils Absolute: 0.1 10*3/uL (ref 0.0–0.1)
Basophils Relative: 1 %
EOS ABS: 0.3 10*3/uL (ref 0.0–0.7)
Eosinophils Relative: 3 %
HCT: 38.5 % (ref 36.0–46.0)
Hemoglobin: 12.3 g/dL (ref 12.0–15.0)
Lymphocytes Relative: 11 %
Lymphs Abs: 1.1 10*3/uL (ref 0.7–4.0)
MCH: 29 pg (ref 26.0–34.0)
MCHC: 31.9 g/dL (ref 30.0–36.0)
MCV: 90.8 fL (ref 78.0–100.0)
MONO ABS: 0.8 10*3/uL (ref 0.1–1.0)
MONOS PCT: 8 %
Neutro Abs: 7.7 10*3/uL (ref 1.7–7.7)
Neutrophils Relative %: 77 %
PLATELETS: 305 10*3/uL (ref 150–400)
RBC: 4.24 MIL/uL (ref 3.87–5.11)
RDW: 15.1 % (ref 11.5–15.5)
WBC: 9.9 10*3/uL (ref 4.0–10.5)

## 2017-11-25 LAB — COMPREHENSIVE METABOLIC PANEL
ALBUMIN: 3.7 g/dL (ref 3.5–5.0)
ALK PHOS: 85 U/L (ref 38–126)
ALT: 165 U/L — ABNORMAL HIGH (ref 14–54)
ANION GAP: 12 (ref 5–15)
AST: 64 U/L — ABNORMAL HIGH (ref 15–41)
BILIRUBIN TOTAL: 0.5 mg/dL (ref 0.3–1.2)
BUN: 16 mg/dL (ref 6–20)
CALCIUM: 9 mg/dL (ref 8.9–10.3)
CO2: 24 mmol/L (ref 22–32)
Chloride: 105 mmol/L (ref 101–111)
Creatinine, Ser: 1.01 mg/dL — ABNORMAL HIGH (ref 0.44–1.00)
GFR calc Af Amer: >60 mL/min (ref >60)
GFR calc non Af Amer: 54 mL/min — ABNORMAL LOW (ref >60)
Glucose, Bld: 139 mg/dL — ABNORMAL HIGH (ref 65–99)
Potassium: 3.1 mmol/L — ABNORMAL LOW (ref 3.5–5.1)
Sodium: 141 mmol/L (ref 135–145)
TOTAL PROTEIN: 6.2 g/dL — AB (ref 6.5–8.1)

## 2017-11-25 LAB — TROPONIN I
TROPONIN I: 0.03 ng/mL — AB (ref <0.03)
Troponin I: 0.03 ng/mL (ref <0.03)

## 2017-11-25 LAB — BRAIN NATRIURETIC PEPTIDE: B NATRIURETIC PEPTIDE 5: 264 pg/mL — AB (ref 0.0–100.0)

## 2017-11-25 MED ORDER — NITROGLYCERIN 0.4 MG SL SUBL: 0.4000 mg | SUBLINGUAL_TABLET | SUBLINGUAL | 0 refills | Status: DC | PRN

## 2017-11-25 MED ORDER — POTASSIUM CHLORIDE CRYS ER 20 MEQ PO TBCR
40.0000 meq | EXTENDED_RELEASE_TABLET | Freq: Once | ORAL | Status: AC
  Administered 2017-11-25: 40 meq via ORAL
  Filled 2017-11-25: qty 2

## 2017-11-25 MED ORDER — FUROSEMIDE 10 MG/ML IJ SOLN
40.0000 mg | Freq: Once | INTRAMUSCULAR | Status: AC
  Administered 2017-11-25: 40 mg via INTRAVENOUS
  Filled 2017-11-25: qty 4

## 2017-11-25 MED ORDER — NITROGLYCERIN 2 % TD OINT
1.0000 [in_us] | TOPICAL_OINTMENT | Freq: Once | TRANSDERMAL | Status: AC
  Administered 2017-11-25: 1 [in_us] via TOPICAL
  Filled 2017-11-25: qty 1

## 2017-11-25 NOTE — ED Provider Notes (Signed)
[facility] EMERGENCY DEPARTMENT Provider Note   CSN: [id number] Arrival date & time: 11/25/17  0120  Time seen 1:35 AM   History   Chief Complaint Chief Complaint  Patient presents with  . Chest Pain    HPI Virginia Huber is a 72 y.o. female.  HPI patient states she was feeling dizzy and lightheaded earlier in the day.  At about 11 PM she was laying in bed and started having central chest pain.  She thought it was indigestion but denies a burning feeling or reflux such as burning fluid in her throat.  She felt short of breath especially when she tried to lay down.  She states she checked her heart rate and it was 107-115.  She took 2 nitroglycerin which did not help immediately but later on seem to have helped because her chest pain is easing up.  Her shortness of breath is improved also.  She states she was diaphoretic.  She denies nausea or vomiting.  She states she felt like her heart was skipping and racing tonight but cannot tell me how long it lasted.  She denies any swelling of her extremities.  She states she had a pacemaker placed in January 2018 because of her atrial fibrillation and she has not had any feelings of palpitations since then until tonight.  She also states she has not had chest pain since she had a pacemaker inserted.  She denies any recent medication changes.  Patient states she has 2 cardiac stents.  PCP Sharilyn Sites, MD Cardiology Dr Bronson Ing  Past Medical History:  Diagnosis Date  . Arthritis   . Atrial fibrillation and flutter ([place])    a. h/o PAF/flutter during admission in 2013 for PNA. b. PAF during adm for NSTEMI 07/2015, subsequent paroxysms since then.  . B12 deficiency anemia   . Cardiac tamponade 06/2016  . Coronary artery disease 11/30/2014   a. remote MI. b. h/o PTCA with scoring balloon to OM1 11/2014. c. NSTEMI 03/2015 s/p DES to prox-mid Cx. d. NSTEMI 07/2015 s/p scoring balloon/PTCA/DES to dRCA with PAF during that admission  . Cutaneous  lupus erythematosus   . GERD (gastroesophageal reflux disease)   . Heart block   . History of blood transfusion 1980's   2nd surgical procedures  . HTN (hypertension)   . Hypercholesteremia   . Hypothyroidism   . Myocardial infarction ([place]) 02/2012  . Ovarian tumor   . PAD (peripheral artery disease) ([place])    a. s/p LE angio 2015; followed by Dr. Fletcher Anon - managed medically.  . Pain with urination 05/08/2015  . Paroxysmal atrial flutter ([place])   . Pericardial effusion    a. 06/2016 after ppm - s/p pericardiocentesis.  . Superficial fungus infection of skin 06/29/2013  . Tachy-brady syndrome ([place])    a. s/p [employer] PPM 06/2016, c/b lead perf/pericardial effusion.  [name] TIA (transient ischemic attack) 08/2001; ~ 2006  . Type II diabetes mellitus ([place])   . UTI (urinary tract infection) 05/08/2013    Patient Active Problem List   Diagnosis Date Noted  . CKD (chronic kidney disease) stage 3, GFR 30-59 ml/min (HCC) 09/23/2017  . Fever 09/23/2017  . S/P pericardiocentesis 06/28/2016  . Acute blood loss anemia 06/28/2016  . Pericardial effusion 06/26/2016  . Tachy-brady syndrome ([place]) 06/25/2016  . Tamponade   . Bradycardia 06/14/2016  . Junctional bradycardia   . Coronary artery disease involving coronary bypass graft of native heart with unstable angina pectoris ([place])   . Acute diastolic  CHF (congestive heart failure), NYHA class 3 ([place])   . Systolic congestive heart failure ([place]) 05/13/2016  . Multiple and bilateral precerebral artery syndromes 05/13/2016  . OSA (obstructive sleep apnea) 05/13/2016  . Chronic diarrhea 12/25/2015  . Chest pain 08/02/2015  . Atrial fibrillation with rapid ventricular response ([place])   . Pain with urination 05/08/2015  . NSTEMI (non-ST elevated myocardial infarction) ([place]) 04/02/2015  . CAD in native artery 11/30/2014  . 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   . PAD (peripheral artery disease) ([place])  12/26/2013  . Superficial fungus infection of skin 06/29/2013  . UTI (urinary tract infection) 05/08/2013  . Hypokalemia 03/05/2012  . B12 deficiency anemia 03/02/2012  . Bronchospasm 03/02/2012  . Community acquired bacterial pneumonia 03/01/2012  . Acute respiratory failure with hypoxia ([place]) 03/01/2012  . DM type 2 causing vascular disease ([place]) 02/29/2012  . Hypothyroidism 02/28/2012  . RLQ abdominal pain 11/24/2010  . OVERWEIGHT/OBESITY 06/03/2010  . Essential hypertension 06/03/2010  . Mixed hyperlipidemia 12/27/2009  . Palpitations 05/17/2009  . FRACTURE, TOE 12/06/2007    Past Surgical History:  Procedure Laterality Date  . ABDOMINAL AORTAGRAM N/A 01/03/2014   Procedure: ABDOMINAL [name];  Surgeon: Wellington Hampshire, MD;  Location: [place] CATH LAB;  Service: Cardiovascular;  Laterality: N/A;  . ABDOMINAL HYSTERECTOMY  1972   "partial"  . APPENDECTOMY  1970's  . CARDIAC CATHETERIZATION  2008   Tiny OM-2 with 90% narrowing. Med tx.  [name] CARDIAC CATHETERIZATION N/A 11/30/2014   Procedure: Left Heart Cath and Coronary Angiography;  Surgeon: Troy Sine, MD; LAD 20%, CFX 50%, OM1 95%, right PLB 30%, LV normal   . CARDIAC CATHETERIZATION N/A 11/30/2014   Procedure: Coronary Balloon Angioplasty;  Surgeon: Troy Sine, MD;  Angiosculpt scoring balloon and PTCA to the OM1 reducing stenosis from 95% to less than 10%  . CARDIAC CATHETERIZATION N/A 04/03/2015   Procedure: Left Heart Cath and Coronary Angiography;  Surgeon: Jolaine Artist, MD; dLAD 50%, CFX 90%, OM1 100%, PLA 15%, LVEDP 13    . CARDIAC CATHETERIZATION N/A 04/03/2015   Procedure: Coronary Stent Intervention;  Surgeon: Sherren Mocha, MD; 3.0x18 mm Xience DES to the CFX    . CARDIAC CATHETERIZATION N/A 08/02/2015   Procedure: Left Heart Cath and Coronary Angiography;  Surgeon: Troy Sine, MD;  Location: [place] CV LAB;  Service: Cardiovascular;  Laterality: N/A;  . CARDIAC CATHETERIZATION N/A 08/02/2015    Procedure: Coronary Stent Intervention;  Surgeon: Troy Sine, MD;  Location: [place] CV LAB;  Service: Cardiovascular;  Laterality: N/A;  . CARDIAC CATHETERIZATION N/A 06/25/2016   Procedure: Pericardiocentesis;  Surgeon: Will Meredith Leeds, MD;  Location: [place] CV LAB;  Service: Cardiovascular;  Laterality: N/A;  . cardiac stents    . CHOLECYSTECTOMY OPEN  1990's  . COLONOSCOPY  2005   Dr. Laural Golden: pancolonic divericula, polyp, path unknown currently  . COLONOSCOPY  2012   Dr. Oneida Alar: Normal TI, scattered diverticula in entire colon, small internal hemorrhoids, normal colon biopsies. Colonoscopy in 5-10 years.   . COLOSTOMY  05/1979  . COLOSTOMY REVERSAL  11/1979  . EP IMPLANTABLE DEVICE N/A 06/25/2016   Procedure: Lead Revision/Repair;  Surgeon: Will Meredith Leeds, MD;  Location: [place] CV LAB;  Service: Cardiovascular;  Laterality: N/A;  . EP IMPLANTABLE DEVICE N/A 06/25/2016   Procedure: Pacemaker Implant;  Surgeon: Will Meredith Leeds, MD;  Location: [place] CV LAB;  Service: Cardiovascular;  Laterality: N/A;  .  EXCISIONAL HEMORRHOIDECTOMY  1970's  . EYE SURGERY Left 2000   "branch vein occlusion"  . EYE SURGERY Left ~ 2001   "smoothed out wrinkle"  . LEFT OOPHORECTOMY  05/1979   nicked bowel, peritonitis, colostomy; colostomy reversed 1981   . LOWER EXTREMITY ANGIOGRAM N/A 01/03/2014   Procedure: LOWER EXTREMITY ANGIOGRAM;  Surgeon: Wellington Hampshire, MD;  Location: [place] CATH LAB;  Service: Cardiovascular;  Laterality: N/A;  . Nuclear med stress test  10/2011   Small area of mild ischemia inferoapically.  [name] PARTIAL HYSTERECTOMY  1970's   left ovaries, then ovaries removed later due tumors   . RIGHT OOPHORECTOMY  1970's     OB History    Gravida  3   Para  2   Term      Preterm      AB  1   Living  2     SAB  1   TAB      Ectopic      Multiple      Live Births  2            Home Medications    Prior to Admission medications   Medication  Sig Start Date End Date Taking? Authorizing Provider  acetaminophen (TYLENOL) 325 MG tablet Take 650 mg by mouth every 6 (six) hours as needed for headache.    [provider]  albuterol (PROVENTIL HFA;VENTOLIN HFA) 108 (90 Base) MCG/ACT inhaler Inhale 2 puffs into the lungs every 6 (six) hours as needed for wheezing or shortness of breath. 07/29/17   Herminio Commons, MD  albuterol (PROVENTIL) (2.5 MG/3ML) 0.083% nebulizer solution Take 3 mLs (2.5 mg total) by nebulization every 6 (six) hours as needed for wheezing or shortness of breath. 09/30/17   Kathie Dike, MD  amiodarone (PACERONE) 200 MG tablet Take 1 tablet (200 mg total) by mouth daily. 07/06/16   Camnitz, Ocie Doyne, MD  apixaban (ELIQUIS) 5 MG TABS tablet TAKE (1) TABLET BY MOUTH TWICE DAILY. 08/27/17   Herminio Commons, MD  atorvastatin (LIPITOR) 80 MG tablet Take 1 tablet (80 mg total) by mouth daily at 6 PM. 04/27/16   Herminio Commons, MD  carvedilol (COREG) 25 MG tablet Take 1 tablet (25 mg total) by mouth 2 (two) times daily. 07/15/17 10/13/17  Camnitz, Ocie Doyne, MD  cholecalciferol (VITAMIN D) 1000 UNITS tablet Take 50,000 Units by mouth once a week.     [provider]  clopidogrel (PLAVIX) 75 MG tablet TAKE ONE (1) TABLET BY MOUTH EVERY DAY 09/07/17   Herminio Commons, MD  diltiazem (CARDIZEM CD) 360 MG 24 hr capsule Take 1 capsule (360 mg total) by mouth daily. 08/09/17   Herminio Commons, MD  diphenhydramine-acetaminophen (TYLENOL PM EXTRA STRENGTH) 25-500 MG TABS tablet Take 1 tablet by mouth at bedtime as needed (Take as directed).     [provider]  furosemide (LASIX) 40 MG tablet Take 1.5 tablets (60 mg total) by mouth daily. 09/30/17   Kathie Dike, MD  glucose blood (FREESTYLE LITE) test strip USE AS DIRECTED TWICE DAILY. 05/05/17   Cassandria Anger, MD  insulin NPH-regular Human (NOVOLIN 70/30) (70-30) 100 UNIT/ML injection Inject 40 Units into the skin See admin  instructions. Inject 25 units before breakfast and 25 units before supper  - only if pre-meal blood glucose is above 90 mg/dL. 09/30/17   Kathie Dike, MD  levothyroxine (SYNTHROID, LEVOTHROID) 200 MCG tablet Take 1 tablet (200 mcg total) by mouth  daily. brfore breakfast 09/30/17   Kathie Dike, MD  losartan (COZAAR) 100 MG tablet TAKE ONE (1) TABLET EACH DAY 06/17/17   Herminio Commons, MD  metFORMIN (GLUCOPHAGE) 500 MG tablet TAKE ONE TABLET BY MOUTH TWICE A DAY WITH A MEAL 08/26/17   Nida, Marella Chimes, MD  nitroGLYCERIN (NITROSTAT) 0.4 MG SL tablet Place 1 tablet (0.4 mg total) under the tongue every 5 (five) minutes as needed for chest pain. 11/25/17   Rolland Porter, MD  potassium chloride SA (K-DUR,KLOR-CON) 20 MEQ tablet TAKE ONE (1) TABLET BY MOUTH EVERY DAY 10/16/16   Herminio Commons, MD  predniSONE (DELTASONE) 10 MG tablet Take 40mg  po daily for 2 days then 30mg  daily for 2 days then 20mg  daily for 2 days then 10mg  daily for 2 days then stop 09/30/17   Kathie Dike, MD  rOPINIRole (REQUIP) 0.5 MG tablet Take 0.5 mg by mouth at bedtime.  03/05/14   [provider]  traZODone (DESYREL) 50 MG tablet Take 1 tablet (50 mg total) by mouth at bedtime as needed for sleep. 09/30/17   Kathie Dike, MD    Family History Family History  Problem Relation Age of Onset  . Heart disease Mother        deceased  . Heart disease Father        deceased, heart disease  . Diabetes Brother   . Heart disease Brother   . Thyroid disease Brother   . Heart disease Sister   . Heart disease Brother   . Thyroid disease Brother   . Lupus Daughter   . Colon cancer Neg Hx     Social History Social History   Tobacco Use  . Smoking status: Never Smoker  . Smokeless tobacco: Never Used  . Tobacco comment: Never smoked  Substance Use Topics  . Alcohol use: No    Alcohol/week: 0.0 oz  . Drug use: No  lives at home   Allergies   Penicillins and Percocet  [oxycodone-acetaminophen]   Review of Systems Review of Systems  All other systems reviewed and are negative.    Physical Exam Updated Vital Signs BP (!) 147/72 (BP Location: Right Arm)   Pulse (!) 101   Temp 97.8 F (36.6 C) (Oral)   Resp 20   Ht 5\' 3"  (1.6 m)   Wt 68 kg (150 lb)   SpO2 96%   BMI 26.57 kg/m   Vital signs normal except borderline tachycardia   Physical Exam  Constitutional: She is oriented to person, place, and time. She appears well-developed and well-nourished.  Non-toxic appearance. She does not appear ill. No distress.  HENT:  Head: Normocephalic and atraumatic.  Right Ear: External ear normal.  Left Ear: External ear normal.  Nose: Nose normal. No mucosal edema or rhinorrhea.  Mouth/Throat: Oropharynx is clear and moist and mucous membranes are normal. No dental abscesses or uvula swelling.  Eyes: Pupils are equal, round, and reactive to light. Conjunctivae and EOM are normal.  Neck: Normal range of motion and full passive range of motion without pain. Neck supple.  Cardiovascular: Normal rate, regular rhythm and normal heart sounds. Exam reveals no gallop and no friction rub.  No murmur heard. Pulmonary/Chest: Effort normal and breath sounds normal. No respiratory distress. She has no wheezes. She has no rhonchi. She has no rales. She exhibits no tenderness and no crepitus.  Abdominal: Soft. Normal appearance and bowel sounds are normal. She exhibits no distension. There is no tenderness. There is no rebound  and no guarding.  Musculoskeletal: Normal range of motion. She exhibits edema. She exhibits no tenderness.  Moves all extremities well.  Patient does have some mild swelling of both of her ankles.  Neurological: She is alert and oriented to person, place, and time. She has normal strength. No cranial nerve deficit.  Skin: Skin is warm, dry and intact. No rash noted. No erythema. No pallor.  Psychiatric: She has a normal mood and affect. Her speech  is normal and behavior is normal. Her mood appears not anxious.  Nursing note and vitals reviewed.    ED Treatments / Results  Labs (all labs ordered are listed, but only abnormal results are displayed)  Results for orders placed or performed during the hospital encounter of 11/25/17  Comprehensive metabolic panel  Result Value Ref Range   Sodium 141 135 - 145 mmol/L   Potassium 3.1 (L) 3.5 - 5.1 mmol/L   Chloride 105 101 - 111 mmol/L   CO2 24 22 - 32 mmol/L   Glucose, Bld 139 (H) 65 - 99 mg/dL   BUN 16 6 - 20 mg/dL   Creatinine, Ser 1.01 (H) 0.44 - 1.00 mg/dL   Calcium 9.0 8.9 - 10.3 mg/dL   Total Protein 6.2 (L) 6.5 - 8.1 g/dL   Albumin 3.7 3.5 - 5.0 g/dL   AST 64 (H) 15 - 41 U/L   ALT 165 (H) 14 - 54 U/L   Alkaline Phosphatase 85 38 - 126 U/L   Total Bilirubin 0.5 0.3 - 1.2 mg/dL   GFR calc non Af Amer 54 (L) >60 mL/min   GFR calc Af Amer >60 >60 mL/min   Anion gap 12 5 - 15  Troponin I  Result Value Ref Range   Troponin I 0.03 (HH) <0.03 ng/mL  CBC with Differential  Result Value Ref Range   WBC 9.9 4.0 - 10.5 K/uL   RBC 4.24 3.87 - 5.11 MIL/uL   Hemoglobin 12.3 12.0 - 15.0 g/dL   HCT 38.5 36.0 - 46.0 %   MCV 90.8 78.0 - 100.0 fL   MCH 29.0 26.0 - 34.0 pg   MCHC 31.9 30.0 - 36.0 g/dL   RDW 15.1 11.5 - 15.5 %   Platelets 305 150 - 400 K/uL   Neutrophils Relative % 77 %   Neutro Abs 7.7 1.7 - 7.7 K/uL   Lymphocytes Relative 11 %   Lymphs Abs 1.1 0.7 - 4.0 K/uL   Monocytes Relative 8 %   Monocytes Absolute 0.8 0.1 - 1.0 K/uL   Eosinophils Relative 3 %   Eosinophils Absolute 0.3 0.0 - 0.7 K/uL   Basophils Relative 1 %   Basophils Absolute 0.1 0.0 - 0.1 K/uL  Brain natriuretic peptide  Result Value Ref Range   B Natriuretic Peptide 264.0 (H) 0.0 - 100.0 pg/mL  Troponin I  Result Value Ref Range   Troponin I 0.03 (HH) <0.03 ng/mL      Laboratory interpretation all normal except hypokalemia, borderline + troponin, renal insufficiency, positive troponin  however her delta troponin is negative, the second troponin is 6 hours after onset of her discomfort.   EKG EKG Interpretation  Date/Time:  Thursday November 25 2017 01:27:45 EDT Ventricular Rate:  100 PR Interval:    QRS Duration: 98 QT Interval:  393 QTC Calculation: 507 R Axis:   8 Text Interpretation:  Sinus tachycardia LVH with secondary repolarization abnormality Prolonged QT interval Baseline wander in lead(s) V2 Since last tracing 24 Sep 2017 Sinus tachycardia has  replaced ATRIAL PACED RHYTHM Confirmed by Rolland Porter [phone]) on 11/25/2017 1:32:40 AM   Radiology Dg Chest Port 1 View  Result Date: 11/25/2017 CLINICAL DATA:  Mid chest pain and shortness of breath. EXAM: PORTABLE CHEST 1 VIEW COMPARISON:  Radiographs 09/28/2017 FINDINGS: Left-sided pacemaker remains in place. Mild cardiomegaly. Vascular congestion without overt pulmonary edema. Resolved left pleural effusion from prior exam. No focal airspace disease or pneumothorax. No acute osseous abnormalities. IMPRESSION: Mild cardiomegaly with vascular congestion. Resolved left pleural effusion from prior exam. Electronically Signed   By: Jeb Levering M.D.   On: 11/25/2017 02:13    Procedures Procedures (including critical care time)  Medications Ordered in ED Medications  nitroGLYCERIN (NITROGLYN) 2 % ointment 1 inch (1 inch Topical Given 11/25/17 0201)  potassium chloride SA (K-DUR,KLOR-CON) CR tablet 40 mEq (40 mEq Oral Given 11/25/17 0241)  furosemide (LASIX) injection 40 mg (40 mg Intravenous Given 11/25/17 0312)     Initial Impression / Assessment and Plan / ED Course  I have reviewed the triage vital signs and the nursing notes.  Pertinent labs & imaging results that were available during my care of the patient were reviewed by me and considered in my medical decision making (see chart for details).     When I look at the monitor patient's heart rate is 98 and regular, there are P waves before the QRS complexes.   Laboratory testing and chest x-ray ordered.  Nitroglycerin paste was placed on her chest for her hypertension and to see if that would help with the rest of her chest pain.    Review of her chart shows she was admitted from April 4 through April 11 with acute exacerbation of congestive heart failure and bronchitis.  She was noted to have acute respiratory failure with hypoxia.  Her BNP was 841 and her respiratory viral panel was positive for coronavirus.  Also at that time her Synthroid dose was increased because her TSH was elevated.  6:30 AM patient's delta troponin is negative.  Patient states this is the first time she has had chest pain since she had her pacemaker inserted in January 2018.  Nurses report when they ambulate her to the bathroom her pulse ox is 96%.  Her heart rate is in the 60s and is normal sinus rhythm.  Her chest pain is a 1.  I am going to talk to cardiology and see what they recommend.  06:47 AM Dr Sallyanne Kuster, cardiology, feels she can be discharged home and follow up in the office. He will leave at message at Dr Court Joy office to make sure she gets a follow up appointment.   Final Clinical Impressions(s) / ED Diagnoses   Final diagnoses:  Atypical chest pain    ED Discharge Orders        Ordered    nitroGLYCERIN (NITROSTAT) 0.4 MG SL tablet  Every 5 min PRN     11/25/17 0649      Plan discharge  Rolland Porter, MD, Barbette Or, MD 11/25/17 [phone]

## 2017-11-25 NOTE — ED Notes (Signed)
EKG done and seen by Dr Rolland Porter. Patient on 12 lead cardiac monitor at this time.

## 2017-11-25 NOTE — ED Notes (Signed)
Date and time results received: 11/25/17 2:34 AM  Test: troponin Critical Value: 0.03  Name of Provider Notified: Dr Tomi Bamberger  Orders Received? Or Actions Taken?: no new orders at this time.

## 2017-11-25 NOTE — ED Notes (Signed)
Pt ambulatory with standby assist from this nurse. Steady, slow limping gait due to recent left ankle fx. No dizziness reported.

## 2017-11-25 NOTE — ED Triage Notes (Signed)
Pt reports mid sternal chest pain that started approx 11 pm, has taken 2 nitro with minimal relief.  Pt reports pain as tightness, and some dizziness thru the day.

## 2017-11-25 NOTE — ED Notes (Signed)
Patient ambulated to restroom with standby assistance. Back in room

## 2017-11-25 NOTE — Discharge Instructions (Addendum)
Please call Dr Kristian Covey office to get an appointment to see him in the office soon. Take the nitroglycerin sublingual tablets if you get chest pain again. Return to the ED if you feel worse.

## 2017-11-25 NOTE — ED Notes (Addendum)
Pt walked to bathroom- standby assist by this nurse- pt had steady gait, no dizziness, no SOB observed or reported. Pt back to room, connected to cardiac monitor and O2 sensor.  O2 sensor reads 96% after ambulating back from bathroom. Dr Tomi Bamberger made aware.

## 2017-11-29 ENCOUNTER — Ambulatory Visit: Payer: PPO | Admitting: Cardiovascular Disease

## 2017-12-01 ENCOUNTER — Other Ambulatory Visit: Payer: Self-pay | Admitting: Cardiovascular Disease

## 2017-12-01 ENCOUNTER — Telehealth: Payer: Self-pay | Admitting: Cardiovascular Disease

## 2017-12-01 DIAGNOSIS — E663 Overweight: Secondary | ICD-10-CM | POA: Diagnosis not present

## 2017-12-01 DIAGNOSIS — J159 Unspecified bacterial pneumonia: Secondary | ICD-10-CM | POA: Diagnosis not present

## 2017-12-01 DIAGNOSIS — G4733 Obstructive sleep apnea (adult) (pediatric): Secondary | ICD-10-CM | POA: Diagnosis not present

## 2017-12-01 DIAGNOSIS — J9601 Acute respiratory failure with hypoxia: Secondary | ICD-10-CM | POA: Diagnosis not present

## 2017-12-01 NOTE — Telephone Encounter (Signed)
Pt reports going to the hospital last Thursday for "my heart was beating fast and I was filling up with fluid". She has been taking an extra Lasix since Monday  (she states Dr. Bronson Ing told her to if needed).  Fluid is not coming off. She also reports HR elevated since last night around supper.  States that it is going up and down. Advised pt to continue to monitor, elevate legs and we will discuss further in clinic tomorrow.  Currently BP 150/85, HR 114. Advised I would send this to Dr. Curt Bears to advise on. Will ask if he is ok with her taking a extra half of a Carvedilol (12.5 mg) to see if this helps w/ HR until she can be seen in the office tomorrow. Patient verbalized understanding and knows I will call her back once advised upon by physician.

## 2017-12-01 NOTE — Telephone Encounter (Signed)
Patient states that she has appointment tomorrow w/ PA but is having issues with feet swelling and elevated HR. Patient is asking for advice as to whether wait for appointment tomorrow or go to ED. / tg

## 2017-12-02 ENCOUNTER — Ambulatory Visit: Payer: PPO | Admitting: Student

## 2017-12-02 ENCOUNTER — Encounter: Payer: Self-pay | Admitting: Student

## 2017-12-02 VITALS — BP 160/70 | HR 70 | Ht 63.0 in | Wt 157.4 lb

## 2017-12-02 DIAGNOSIS — I10 Essential (primary) hypertension: Secondary | ICD-10-CM | POA: Diagnosis not present

## 2017-12-02 DIAGNOSIS — I2510 Atherosclerotic heart disease of native coronary artery without angina pectoris: Secondary | ICD-10-CM

## 2017-12-02 DIAGNOSIS — I495 Sick sinus syndrome: Secondary | ICD-10-CM | POA: Diagnosis not present

## 2017-12-02 DIAGNOSIS — I480 Paroxysmal atrial fibrillation: Secondary | ICD-10-CM | POA: Diagnosis not present

## 2017-12-02 DIAGNOSIS — I5032 Chronic diastolic (congestive) heart failure: Secondary | ICD-10-CM | POA: Diagnosis not present

## 2017-12-02 DIAGNOSIS — E782 Mixed hyperlipidemia: Secondary | ICD-10-CM | POA: Diagnosis not present

## 2017-12-02 DIAGNOSIS — E785 Hyperlipidemia, unspecified: Secondary | ICD-10-CM | POA: Diagnosis not present

## 2017-12-02 MED ORDER — METOLAZONE 2.5 MG PO TABS: ORAL_TABLET | ORAL | 0 refills | Status: DC

## 2017-12-02 NOTE — Progress Notes (Signed)
Cardiology Office Note    Date:  12/02/2017   ID:  Virginia Huber, DOB [birth year], MRN [id number]  PCP:  Sharilyn Sites, MD  Cardiologist: Kate Sable, MD   EP: Dr. Curt Bears  Chief Complaint  Patient presents with  . Follow-up    recent Emergency Dept visit    History of Present Illness:    Virginia Huber is a 72 y.o. female with past medical history of CAD(s/p PTCA of OM1 in 2016, DES to LCx in 2016, DES to RCA in 2017), chronic diastolic CHF, PAF (on Eliquis), tachy-brady syndrome (s/p Medtronic PPM placement in 2018), HTN, HLD, and IDDM who presents to the office today from a recent Emergency Department visit.   She was last examined by Dr. Bronson Ing on 10/06/2017 following a recent admission for an acute CHF exacerbation and bronchitis (respiratory panel was positive for coronavirus during admission). Reported feeling weak at the time of her visit and had noticed improvement in her breathing and weight was overall stable with less than a 1 lb change since hospital discharge (161 lbs on office scales). She was continued on Lasix 60mg  daily at that time and instructed to take an additional 40mg  tablet for weight gain > 3 lbs overnight.   In the interim, she was evaluated in the ED on 11/25/2017 for chest discomfort which it started earlier that day.  She took 2 SL NTG with gradual improvement in her symptoms but the pain did not completely resolve, therefore she came to the ED. initial and delta troponin values were flat at 0.03 and BNP was slightly elevated at 264 but this was improved from her hospitalization 2 months prior. CXR showed mild cardiomegaly with vascular congestion and a resolved left pleural effusion from prior examination. EKG showed no acute ST changes and she was discharged home and informed to follow-up with Cardiology in the outpatient setting.  In talking with the patient today, she reports that she did not have specific chest discomfort last week but overall  felt her heart racing and was short of breath at that time. Upon her heart rate improving, symptoms resolved.  She has noted intermittent palpitations since and says heart rate was elevated into the low-100's 2 days ago.  She called the office and was informed to take an extra half tablet of Carvedilol if needed but has not had to utilize this yet. Heart rate is well-controlled in the 70's during today's visit.  She has experienced worsening dyspnea on exertion and lower extremity edema over the past several days with an associated 5 pound weight gain on her home scales. She increased her Lasix and has been taking 100mg  daily with no improvement in her symptoms.   Past Medical History:  Diagnosis Date  . Arthritis   . Atrial fibrillation and flutter ([place])    a. h/o PAF/flutter during admission in 2013 for PNA. b. PAF during adm for NSTEMI 07/2015, subsequent paroxysms since then.  . B12 deficiency anemia   . Cardiac tamponade 06/2016  . Coronary artery disease 11/30/2014   a. remote MI. b. h/o PTCA with scoring balloon to OM1 11/2014. c. NSTEMI 03/2015 s/p DES to prox-mid Cx. d. NSTEMI 07/2015 s/p scoring balloon/PTCA/DES to dRCA with PAF during that admission  . Cutaneous lupus erythematosus   . GERD (gastroesophageal reflux disease)   . Heart block   . History of blood transfusion 1980's   2nd surgical procedures  . HTN (hypertension)   . Hypercholesteremia   . Hypothyroidism   .  Myocardial infarction ([place]) 02/2012  . Ovarian tumor   . PAD (peripheral artery disease) ([place])    a. s/p LE angio 2015; followed by Dr. Fletcher Anon - managed medically.  . Pain with urination 05/08/2015  . Paroxysmal atrial flutter ([place])   . Pericardial effusion    a. 06/2016 after ppm - s/p pericardiocentesis.  . Superficial fungus infection of skin 06/29/2013  . Tachy-brady syndrome ([place])    a. s/p Medtronic PPM 06/2016, c/b lead perf/pericardial effusion.  [name] TIA (transient ischemic attack) 08/2001; ~ 2006  . Type II  diabetes mellitus ([place])   . UTI (urinary tract infection) 05/08/2013    Past Surgical History:  Procedure Laterality Date  . ABDOMINAL AORTAGRAM N/A 01/03/2014   Procedure: ABDOMINAL [name];  Surgeon: Wellington Hampshire, MD;  Location: [facility];  Service: Cardiovascular;  Laterality: N/A;  . ABDOMINAL HYSTERECTOMY  1972   "partial"  . APPENDECTOMY  1970's  . CARDIAC CATHETERIZATION  2008   Tiny OM-2 with 90% narrowing. Med tx.  [name] CARDIAC CATHETERIZATION N/A 11/30/2014   Procedure: Left Heart Cath and Coronary Angiography;  Surgeon: Troy Sine, MD; LAD 20%, CFX 50%, OM1 95%, right PLB 30%, LV normal   . CARDIAC CATHETERIZATION N/A 11/30/2014   Procedure: Coronary Balloon Angioplasty;  Surgeon: Troy Sine, MD;  Angiosculpt scoring balloon and PTCA to the OM1 reducing stenosis from 95% to less than 10%  . CARDIAC CATHETERIZATION N/A 04/03/2015   Procedure: Left Heart Cath and Coronary Angiography;  Surgeon: Jolaine Artist, MD; dLAD 50%, CFX 90%, OM1 100%, PLA 15%, LVEDP 13    . CARDIAC CATHETERIZATION N/A 04/03/2015   Procedure: Coronary Stent Intervention;  Surgeon: Sherren Mocha, MD; 3.0x18 mm Xience DES to the CFX    . CARDIAC CATHETERIZATION N/A 08/02/2015   Procedure: Left Heart Cath and Coronary Angiography;  Surgeon: Troy Sine, MD;  Location: [facility];  Service: Cardiovascular;  Laterality: N/A;  . CARDIAC CATHETERIZATION N/A 08/02/2015   Procedure: Coronary Stent Intervention;  Surgeon: Troy Sine, MD;  Location: [facility];  Service: Cardiovascular;  Laterality: N/A;  . CARDIAC CATHETERIZATION N/A 06/25/2016   Procedure: Pericardiocentesis;  Surgeon: Will Meredith Leeds, MD;  Location: [facility];  Service: Cardiovascular;  Laterality: N/A;  . cardiac stents    . CHOLECYSTECTOMY OPEN  1990's  . COLONOSCOPY  2005   Dr. Laural Golden: pancolonic divericula, polyp, path unknown currently  . COLONOSCOPY  2012   Dr. Oneida Alar: Normal TI, scattered  diverticula in entire colon, small internal hemorrhoids, normal colon biopsies. Colonoscopy in 5-10 years.   . COLOSTOMY  05/1979  . COLOSTOMY REVERSAL  11/1979  . EP IMPLANTABLE DEVICE N/A 06/25/2016   Procedure: Lead Revision/Repair;  Surgeon: Will Meredith Leeds, MD;  Location: [facility];  Service: Cardiovascular;  Laterality: N/A;  . EP IMPLANTABLE DEVICE N/A 06/25/2016   Procedure: Pacemaker Implant;  Surgeon: Will Meredith Leeds, MD;  Location: [facility];  Service: Cardiovascular;  Laterality: N/A;  . EXCISIONAL HEMORRHOIDECTOMY  1970's  . EYE SURGERY Left 2000   "branch vein occlusion"  . EYE SURGERY Left ~ 2001   "smoothed out wrinkle"  . LEFT OOPHORECTOMY  05/1979   nicked bowel, peritonitis, colostomy; colostomy reversed 1981   . LOWER EXTREMITY ANGIOGRAM N/A 01/03/2014   Procedure: LOWER EXTREMITY ANGIOGRAM;  Surgeon: Wellington Hampshire, MD;  Location: [facility];  Service: Cardiovascular;  Laterality: N/A;  . Nuclear med stress test  10/2011   Small area of mild ischemia inferoapically.  [name] PARTIAL HYSTERECTOMY  1970's   left ovaries, then ovaries removed later due tumors   . RIGHT OOPHORECTOMY  1970's    Current Medications: Outpatient Medications Prior to Visit  Medication Sig Dispense Refill  . acetaminophen (TYLENOL) 325 MG tablet Take 650 mg by mouth every 6 (six) hours as needed for headache.    [name] amiodarone (PACERONE) 200 MG tablet Take 1 tablet (200 mg total) by mouth daily. 30 tablet 6  . apixaban (ELIQUIS) 5 MG TABS tablet TAKE (1) TABLET BY MOUTH TWICE DAILY. 180 tablet 3  . atorvastatin (LIPITOR) 80 MG tablet Take 1 tablet (80 mg total) by mouth daily at 6 PM. 30 tablet 6  . cholecalciferol (VITAMIN D) 1000 UNITS tablet Take 50,000 Units by mouth once a week.     . clopidogrel (PLAVIX) 75 MG tablet TAKE ONE (1) TABLET BY MOUTH EVERY DAY 90 tablet 3  . diltiazem (CARDIZEM CD) 360 MG 24 hr capsule Take 1 capsule (360 mg total) by mouth daily. 90 capsule  3  . diphenhydramine-acetaminophen (TYLENOL PM EXTRA STRENGTH) 25-500 MG TABS tablet Take 1 tablet by mouth at bedtime as needed (Take as directed).     . furosemide (LASIX) 40 MG tablet Take 1.5 tablets (60 mg total) by mouth daily. 90 tablet 3  . glucose blood (FREESTYLE LITE) test strip USE AS DIRECTED TWICE DAILY. 100 each 5  . insulin NPH-regular Human (NOVOLIN 70/30) (70-30) 100 UNIT/ML injection Inject 40 Units into the skin See admin instructions. Inject 25 units before breakfast and 25 units before supper  - only if pre-meal blood glucose is above 90 mg/dL. 10 mL 11  . levothyroxine (SYNTHROID, LEVOTHROID) 175 MCG tablet Take 175 mcg by mouth daily before breakfast.    . losartan (COZAAR) 100 MG tablet TAKE ONE (1) TABLET EACH DAY 90 tablet 3  . metFORMIN (GLUCOPHAGE) 500 MG tablet TAKE ONE TABLET BY MOUTH TWICE A DAY WITH A MEAL 180 tablet 0  . nitroGLYCERIN (NITROSTAT) 0.4 MG SL tablet Place 1 tablet (0.4 mg total) under the tongue every 5 (five) minutes as needed for chest pain. 25 tablet 0  . potassium chloride SA (K-DUR,KLOR-CON) 20 MEQ tablet TAKE ONE (1) TABLET BY MOUTH EVERY DAY 90 tablet 3  . rOPINIRole (REQUIP) 0.5 MG tablet Take 0.5 mg by mouth at bedtime.   0  . carvedilol (COREG) 25 MG tablet Take 1 tablet (25 mg total) by mouth 2 (two) times daily. 180 tablet 3  . albuterol (PROVENTIL HFA;VENTOLIN HFA) 108 (90 Base) MCG/ACT inhaler Inhale 2 puffs into the lungs every 6 (six) hours as needed for wheezing or shortness of breath. 1 Inhaler 2  . albuterol (PROVENTIL) (2.5 MG/3ML) 0.083% nebulizer solution Take 3 mLs (2.5 mg total) by nebulization every 6 (six) hours as needed for wheezing or shortness of breath. 75 mL 12  . levothyroxine (SYNTHROID, LEVOTHROID) 200 MCG tablet Take 1 tablet (200 mcg total) by mouth daily. brfore breakfast 30 tablet 0  . predniSONE (DELTASONE) 10 MG tablet Take 40mg  po daily for 2 days then 30mg  daily for 2 days then 20mg  daily for 2 days then 10mg   daily for 2 days then stop 20 tablet 0  . traZODone (DESYREL) 50 MG tablet Take 1 tablet (50 mg total) by mouth at bedtime as needed for sleep. 30 tablet 0   No facility-administered medications prior to visit.      Allergies:  Penicillins and Percocet [oxycodone-acetaminophen]   Social History   Socioeconomic History  . Marital status: Married    Spouse name: Not on file  . Number of children: Not on file  . Years of education: Not on file  . Highest education level: Not on file  Occupational History  . Occupation: Retired    Fish farm manager: RETIRED    Comment: Research officer, political party  Social Needs  . Financial resource strain: Not on file  . Food insecurity:    Worry: Not on file    Inability: Not on file  . Transportation needs:    Medical: Not on file    Non-medical: Not on file  Tobacco Use  . Smoking status: Never Smoker  . Smokeless tobacco: Never Used  . Tobacco comment: Never smoked  Substance and Sexual Activity  . Alcohol use: No    Alcohol/week: 0.0 oz  . Drug use: No  . Sexual activity: Never    Birth control/protection: Surgical    Comment: hyst  Lifestyle  . Physical activity:    Days per week: Not on file    Minutes per session: Not on file  . Stress: Not on file  Relationships  . Social connections:    Talks on phone: Not on file    Gets together: Not on file    Attends religious service: Not on file    Active member of club or organization: Not on file    Attends meetings of clubs or organizations: Not on file    Relationship status: Not on file  Other Topics Concern  . Not on file  Social History Narrative  . Not on file     Family History:  The patient's family history includes Diabetes in her brother; Heart disease in her brother, brother, father, mother, and sister; Lupus in her daughter; Thyroid disease in her brother and brother.   Review of Systems:   Please see the history of present illness.     General:  No chills, fever, night sweats or  weight changes.  Cardiovascular:  No chest pain, orthopnea, paroxysmal nocturnal dyspnea. Positive for palpitations, dyspnea, and edema.  Dermatological: No rash, lesions/masses Respiratory: No cough, dyspnea Urologic: No hematuria, dysuria Abdominal:   No nausea, vomiting, diarrhea, bright red blood per rectum, melena, or hematemesis Neurologic:  No visual changes, wkns, changes in mental status. All other systems reviewed and are otherwise negative except as noted above.   Physical Exam:    VS:  BP (!) 160/70   Pulse 70   Ht 5\' 3"  (1.6 m)   Wt 157 lb 6.4 oz (71.4 kg)   SpO2 97%   BMI 27.88 kg/m    General: Well developed, well nourished Caucasian female appearing in no acute distress. Head: Normocephalic, atraumatic, sclera non-icteric, no xanthomas, nares are without discharge.  Neck: No carotid bruits. JVD not elevated.  Lungs: Respirations regular and unlabored, without wheezes or rales.  Heart: Regular rate and rhythm. No S3 or S4.  No murmur, no rubs, or gallops appreciated. Abdomen: Soft, non-tender, non-distended with normoactive bowel sounds. No hepatomegaly. No rebound/guarding. No obvious abdominal masses. Msk:  Strength and tone appear normal for age. No joint deformities or effusions. Extremities: No clubbing or cyanosis. 1+ pitting edema up to mid-shins bilaterally.  Distal pedal pulses are 2+ bilaterally. Neuro: Alert and oriented X 3. Moves all extremities spontaneously. No focal deficits noted. Psych:  Responds to questions appropriately with a normal affect. Skin: No rashes or lesions noted  Wt  Readings from Last 3 Encounters:  12/02/17 157 lb 6.4 oz (71.4 kg)  11/25/17 150 lb (68 kg)  10/06/17 161 lb (73 kg)     Studies/Labs Reviewed:   EKG:  EKG is not ordered today. Recent ED EKG reviewed.   Recent Labs: 09/23/2017: TSH 11.574 11/25/2017: ALT 165; B Natriuretic Peptide 264.0; BUN 16; Creatinine, Ser 1.01; Hemoglobin 12.3; Platelets 305; Potassium 3.1;  Sodium 141   Lipid Panel    Component Value Date/Time   CHOL 166 08/05/2017 0807   TRIG 160 (H) 08/05/2017 0807   HDL 43 (L) 08/05/2017 0807   CHOLHDL 3.9 08/05/2017 0807   VLDL NOT CALC 04/14/2016 0910   LDLCALC 96 08/05/2017 0807    Additional studies/ records that were reviewed today include:   Echocardiogram: 07/2017 Study Conclusions  - Left ventricle: The cavity size was normal. Wall thickness was   increased in a pattern of moderate LVH. Systolic function was   normal. The estimated ejection fraction was in the range of 55%   to 60%. Wall motion was normal; there were no regional wall   motion abnormalities. Features are consistent with a pseudonormal   left ventricular filling pattern, with concomitant abnormal   relaxation and increased filling pressure (grade 2 diastolic   dysfunction). Doppler parameters are consistent with high   ventricular filling pressure. - Left atrium: The atrium was severely dilated. - Right ventricle: Pacer wire or catheter noted in right ventricle. - Right atrium: Pacer wire or catheter noted in right atrium.  Assessment:    1. Coronary artery disease involving native coronary artery of native heart without angina pectoris   2. Chronic diastolic heart failure ([place])   3. PAF (paroxysmal atrial fibrillation) ([place])   4. Tachy-brady syndrome ([place])   5. Essential hypertension   6. Hyperlipidemia LDL goal <70      Plan:   In order of problems listed above:  1. CAD - s/p PTCA of OM1 in 2016, DES to LCx in 2016, and DES to RCA in 2017. Reports initially said that she had experienced chest discomfort last week but in talking with the patient, she says she was feeling her heart racing and symptoms resolved with improvement in her heart rate. She denies any recent episodes of chest discomfort. - EKG at that time showed no acute ischemic changes and cyclic troponin values remained flat at 0.03. With her atypical symptoms, would not pursue  further ischemic evaluation at this time. Can consider a NST in the future if she develops anginal symptoms. - continue BB and statin therapy. Not on ASA given the need for anticoagulation.   2. Chronic Diastolic CHF - reports worsening dyspnea on exertion and lower extremity edema over the past several days with an associated 5 pound weight gain on her home scales. She increased her Lasix from 60mg  daily to 100mg  daily with no improvement in her symptoms. Recent CXR showed vascular congestion and BNP was elevated at 264 which was actually improved compared to prior lab values. - With her having increased Lasix and not having improvement in her symptoms, will give an Rx for Metolazone 2.5mg  daily for 3 days. Will need a repeat BMET next week. I have asked her to take an extra K+ tablet while taking Metolazone. If no improvement, can consider switching to Torsemide in the future for improved bioavailability.   3. PAF - She does report intermittent palpitations over the past several days but symptoms have now improved. Heart rate is well controlled  in the 70's during today's visit. Will continue on Carvedilol 25 mg twice daily along with Cardizem CD 360 mg daily for rate control. She has been instructed to take an extra half tablet of Carvedilol if needed for palpitations. Continue Amiodarone 200 mg daily. Will plan to recheck her TSH at the time of lab work next week. - on Eliquis for anticoagulation. Denies any evidence of active bleeding.  4. Tachy-brady Syndrome - s/p Medtronic PPM placement in 06/2016. - followed by Dr. Curt Bears. Scheduled for a remote pacer check in 3 weeks.   5. HTN - BP is elevated at 160/70 during today's visit.  She reports this is well controlled when checked at home. - Continue Carvedilol 25 mg daily, Cardizem CD 360 mg daily, and Losartan 100 mg daily.  6. HLD - Followed by her PCP. Goal LDL is less than 70 in the setting of known CAD. Remains on Atorvastatin 80 mg  daily.   Medication Adjustments/Labs and Tests Ordered: Current medicines are reviewed at length with the patient today.  Concerns regarding medicines are outlined above.  Medication changes, Labs and Tests ordered today are listed in the Patient Instructions below. Patient Instructions  Medication Instructions:   TAKE METOLAZONE 2.5 MG ONCE DAILY FOR THE NEXT 3 DAYS  TAKE AN EXTRA POTASSIUM WITH THE METOLAZONE  Labwork:  Your physician recommends that you return for lab work Monday OR Tuesday   Follow-Up:  Your physician recommends that you schedule a follow-up appointment in: AS SCHEDULED       Signed, Erma Heritage, PA-C  12/02/2017 5:24 PM    [facility] [address] [place], [place] [address] Phone: [phone]

## 2017-12-02 NOTE — Patient Instructions (Signed)
Medication Instructions:   TAKE METOLAZONE 2.5 MG ONCE DAILY FOR THE NEXT 3 DAYS  TAKE AN EXTRA POTASSIUM WITH THE METOLAZONE  Labwork:  Your physician recommends that you return for lab work Monday OR Tuesday   Follow-Up:  Your physician recommends that you schedule a follow-up appointment in: AS SCHEDULED

## 2017-12-02 NOTE — Telephone Encounter (Signed)
Apologized to patient for not calling her back yesterday. Informed patient she may take an extra 1/2 tablet of Carvedilol once daily PRN for elevated HRs w/ AFib, per Dr. Curt Bears. Advised to contact office if she begins to need an extra tablet on a regular basis.  Explained rationale for this. Patient verbalized understanding and agreeable to plan.

## 2017-12-06 DIAGNOSIS — I495 Sick sinus syndrome: Secondary | ICD-10-CM | POA: Diagnosis not present

## 2017-12-06 LAB — BASIC METABOLIC PANEL
BUN/Creatinine Ratio: 14 (calc) (ref 6–22)
BUN: 21 mg/dL (ref 7–25)
CALCIUM: 8.9 mg/dL (ref 8.6–10.4)
CO2: 29 mmol/L (ref 20–32)
Chloride: 101 mmol/L (ref 98–110)
Creat: 1.47 mg/dL — ABNORMAL HIGH (ref 0.60–0.93)
GLUCOSE: 182 mg/dL — AB (ref 65–99)
POTASSIUM: 4.1 mmol/L (ref 3.5–5.3)
SODIUM: 139 mmol/L (ref 135–146)

## 2017-12-06 LAB — TSH: TSH: 0.05 m[IU]/L — AB (ref 0.40–4.50)

## 2017-12-07 ENCOUNTER — Encounter: Payer: Self-pay | Admitting: Cardiovascular Disease

## 2017-12-07 ENCOUNTER — Encounter: Payer: Self-pay | Admitting: Neurology

## 2017-12-08 ENCOUNTER — Encounter: Payer: Self-pay | Admitting: Neurology

## 2017-12-08 ENCOUNTER — Ambulatory Visit: Payer: PPO | Admitting: Neurology

## 2017-12-08 VITALS — BP 116/73 | HR 95 | Ht 63.0 in | Wt 154.0 lb

## 2017-12-08 DIAGNOSIS — I480 Paroxysmal atrial fibrillation: Secondary | ICD-10-CM | POA: Diagnosis not present

## 2017-12-08 DIAGNOSIS — G4733 Obstructive sleep apnea (adult) (pediatric): Secondary | ICD-10-CM

## 2017-12-08 DIAGNOSIS — G620 Drug-induced polyneuropathy: Secondary | ICD-10-CM | POA: Diagnosis not present

## 2017-12-08 DIAGNOSIS — Z9989 Dependence on other enabling machines and devices: Secondary | ICD-10-CM

## 2017-12-08 DIAGNOSIS — G212 Secondary parkinsonism due to other external agents: Secondary | ICD-10-CM | POA: Diagnosis not present

## 2017-12-08 DIAGNOSIS — T462X5A Adverse effect of other antidysrhythmic drugs, initial encounter: Principal | ICD-10-CM

## 2017-12-08 HISTORY — DX: Secondary parkinsonism due to other external agents: G21.2

## 2017-12-08 HISTORY — DX: Paroxysmal atrial fibrillation: I48.0

## 2017-12-08 HISTORY — DX: Drug-induced polyneuropathy: G62.0

## 2017-12-08 NOTE — Progress Notes (Signed)
SLEEP MEDICINE CLINIC   Provider:  Larey Seat, M D  Referring Provider: Sharilyn Sites, MD Primary Care Physician:  Sharilyn Sites, MD  Chief Complaint  Patient presents with  . New Patient (Initial Visit)    pt with husband, rm 71. pt has tremors that comes and goes in bilateral hands. she states there is generalized weakness all over but the tremors are more located in the arms and hands bilaterally. states the tremors have been going on for about a year and worsening. states its intermittent, unknown as to what causes it. she states cold or nervousness makes it way worse. pt is on CPAP and states this is going well. D[employer].    HPI:  Virginia Huber is a 72 y.o. female , seen here as a referral from Dr. Kate Sable, Virginia Huber was originally  referred by her cardiologist for a sleep study to the [facility] location, she completed the sleep study but then was never given the results, a phone call or a follow-up appointment. She stated that she had similar experiences with the physician before when she had suffered a TIA and her follow-up appointments were not properly made and results not discussed. I was surprised to see a patient who just had a sleep study on 04/12/2016 30 days later in my office to follow-up on a study that I have not performed. Based on the report the patient has a body mass index of 11, is 72 years of age, and went to the sleep lab 04/12/2016. Her AHI was only 10 per hour which constitutes mild sleep apnea. In supine sleep her AHI was 14.3, the lowest oxygen saturation at night was 86% and the technologist noted loud snoring. She slept about half of the night in supine sleep position she reached REM sleep with an AHI of 29.3 per hour.  Between June 2016 and May 2017 this patient had been admitted 5 times to hospital with atrial fibrillation and related to diastolic output failure. As she is on high embolic risk by A. fib and the  related congestive heart failure, she remains on Eloquis and Plavix. Given the constellation of mild apnea with underlying  CAD , MI 4 years ago, double pneumonia in 2015, atrial fibrillation and previous stroke-TIA, congestive heart failure and REM dependent apnea, she needs to have an urgent CPAP titration.   Sleep habits are as follows:Usual bedtime for the couple is between 12 and 1 AM, usually but neither of them has trouble to fall asleep and [name] reports that she rises usually at 8 or 9 AM. Since she is retired she has time to sleep in. She sleeps almost equal times in supine and lateral position, she sleeps on 2 pillows for support. The bedroom is described as core, quiet and dark. Her husband uses a CPAP machine, she has nocturia frequent up to 3 times at night, fragmenting her sleep. She feels that her sleep is not restorative and refreshing and that she is a light sleeper, thus  easily awoken. Her husband has noted her to snore very loudly as a [employer] officer had reported. She often feels she hasn't slept at all because she lacks the sleeps refreshing and restorative qualities. So when she wakes up in the morning she is not necessarily feeling ready to go. She wakes up frequently with a headache in the morning indicating that she may have hypoxemia or hypercapnia. She was not checked for hypercapnia in her recent sleep study.  Sleep medical history and family sleep history:  Non family history of OSA  Social history: married, patient is a nontobacco user, has never smoked. She does not drink alcohol. She is using only decaffeinated coffees and teas. No shift work history.   Interval history from the 13's of March 2018, The patient underwent a sleep study on 06/08/2016, designated CPAP titration following an outside baseline sleep study. The  total AHI was reduced to only 2.2 / hr under CPAP titration , SpO2  saturation  at or below 88% was 0.0 minutes .  There were no periodic limb  movements. We ordered a CPAP for this patient with the set pressure of 10 cm water, she had a rather poor sleep efficiency under CPAP of 67% but has improved at home dramatically.  She continues now to use CPAP at 10 cm water pressure with 3 cm EPR and has reached  100% compliance for 30 days, 97% compliance for over 4 hours of consecutive use - she was hospitalized for 3 days within this time to receive a pacemaker. Her average user time is 8 hours and 4 minutes at night and her residual AHI is 2.1 -given her complicated cardiac medical history, I think is an excellent result. In addition she reports an Epworth sleepiness score of only 6 points fatigue severity score of only 21 points and the geriatric depression score endorsed at only 1 out of 15 points. She noticed much more daytime alertness, she wakes up not as frequently as before CPAP, she reports no morning headaches. Only once in a while but she still have a bathroom break. Altogether this has been a good result. She was also given a higher dose of diuretics and her ankle edema has markedly improved.    [name] is a 72 y.o. female , is seen here for a new problem on 12-08-2017 .  Virginia Huber has a difficult cardiac history, she had bradycardia with near syncope's as a pacemaker patient has been treated for atrial fibrillation which has paroxysmally occurred on and off and just recently had another bout of rapid ventricular response.  She is on hydralazine, potassium chloride, Plavix, fish oil ropinirole Lasix ipratropium bromide, levothyroxine, vitamin D Eliquis, diltiazem, Nitrostat, albuterol sulfate metformin, and was started on amiodarone.  Her main concern today is tremor unsteadiness frequent falls she also feels not quite in control of her limbs.  2 falls have led to a fracture of her hallux on the right foot and to an ankle fracture or talus fracture left foot.  Her CPAP compliance is excellent, but this is a side line of today's  visit.  CPAP compliance is 93%, 90% for time, average use of 7 hours 43 minutes, CPAP is set at 10 cmH2O with 3 cm expiratory pressure relief and a residual AHI of 2.6 there are very few minor air leaks.  There needs to be no adjustments made.  Last date of download was 12/07/2017.   Review of Systems: Out of a complete 14 system review, the patient complains of only the following symptoms, and all other reviewed systems are negative. Loud snoring, dependent odema, atrial fib, CHF, witnessed apnea, fatigue, TIA.   Epworth score 6 , Fatigue severity score  21 from 37  , depression score 1 from  2/15    Social History   Socioeconomic History  . Marital status: Married    Spouse name: Not on file  . Number of children: Not on file  . Years  of education: Not on file  . Highest education level: Not on file  Occupational History  . Occupation: Retired    Fish farm manager: RETIRED    Comment: Research officer, political party  Social Needs  . Financial resource strain: Not on file  . Food insecurity:    Worry: Not on file    Inability: Not on file  . Transportation needs:    Medical: Not on file    Non-medical: Not on file  Tobacco Use  . Smoking status: Never Smoker  . Smokeless tobacco: Never Used  . Tobacco comment: Never smoked  Substance and Sexual Activity  . Alcohol use: No    Alcohol/week: 0.0 oz  . Drug use: No  . Sexual activity: Never    Birth control/protection: Surgical    Comment: hyst  Lifestyle  . Physical activity:    Days per week: Not on file    Minutes per session: Not on file  . Stress: Not on file  Relationships  . Social connections:    Talks on phone: Not on file    Gets together: Not on file    Attends religious service: Not on file    Active member of club or organization: Not on file    Attends meetings of clubs or organizations: Not on file    Relationship status: Not on file  . Intimate partner violence:    Fear of current or ex partner: Not on file    Emotionally  abused: Not on file    Physically abused: Not on file    Forced sexual activity: Not on file  Other Topics Concern  . Not on file  Social History Narrative  . Not on file    Family History  Problem Relation Age of Onset  . Heart disease Mother        deceased  . Heart disease Father        deceased, heart disease  . Diabetes Brother   . Heart disease Brother   . Thyroid disease Brother   . Heart disease Sister   . Heart disease Brother   . Thyroid disease Brother   . Lupus Daughter   . Colon cancer Neg Hx     Past Medical History:  Diagnosis Date  . Arthritis   . Atrial fibrillation and flutter ([place])    a. h/o PAF/flutter during admission in 2013 for PNA. b. PAF during adm for NSTEMI 07/2015, subsequent paroxysms since then.  . B12 deficiency anemia   . Cardiac tamponade 06/2016  . Coronary artery disease 11/30/2014   a. remote MI. b. h/o PTCA with scoring balloon to OM1 11/2014. c. NSTEMI 03/2015 s/p DES to prox-mid Cx. d. NSTEMI 07/2015 s/p scoring balloon/PTCA/DES to dRCA with PAF during that admission  . Cutaneous lupus erythematosus   . GERD (gastroesophageal reflux disease)   . Heart block   . History of blood transfusion 1980's   2nd surgical procedures  . HTN (hypertension)   . Hypercholesteremia   . Hypothyroidism   . Myocardial infarction ([place]) 02/2012  . Ovarian tumor   . PAD (peripheral artery disease) ([place])    a. s/p LE angio 2015; followed by Dr. Fletcher Anon - managed medically.  . Pain with urination 05/08/2015  . Paroxysmal atrial flutter ([place])   . Pericardial effusion    a. 06/2016 after ppm - s/p pericardiocentesis.  . Superficial fungus infection of skin 06/29/2013  . Tachy-brady syndrome ([place])    a. s/p Medtronic PPM 06/2016, c/b lead perf/pericardial effusion.  [name]  TIA (transient ischemic attack) 08/2001; ~ 2006  . Type II diabetes mellitus ([place])   . UTI (urinary tract infection) 05/08/2013    Past Surgical History:  Procedure Laterality Date  .  ABDOMINAL AORTAGRAM N/A 01/03/2014   Procedure: ABDOMINAL [name];  Surgeon: Wellington Hampshire, MD;  Location: [facility] CATH LAB;  Service: Cardiovascular;  Laterality: N/A;  . ABDOMINAL HYSTERECTOMY  1972   "partial"  . APPENDECTOMY  1970's  . CARDIAC CATHETERIZATION  2008   Tiny OM-2 with 90% narrowing. Med tx.  [name] CARDIAC CATHETERIZATION N/A 11/30/2014   Procedure: Left Heart Cath and Coronary Angiography;  Surgeon: Troy Sine, MD; LAD 20%, CFX 50%, OM1 95%, right PLB 30%, LV normal   . CARDIAC CATHETERIZATION N/A 11/30/2014   Procedure: Coronary Balloon Angioplasty;  Surgeon: Troy Sine, MD;  Angiosculpt scoring balloon and PTCA to the OM1 reducing stenosis from 95% to less than 10%  . CARDIAC CATHETERIZATION N/A 04/03/2015   Procedure: Left Heart Cath and Coronary Angiography;  Surgeon: Jolaine Artist, MD; dLAD 50%, CFX 90%, OM1 100%, PLA 15%, LVEDP 13    . CARDIAC CATHETERIZATION N/A 04/03/2015   Procedure: Coronary Stent Intervention;  Surgeon: Sherren Mocha, MD; 3.0x18 mm Xience DES to the CFX    . CARDIAC CATHETERIZATION N/A 08/02/2015   Procedure: Left Heart Cath and Coronary Angiography;  Surgeon: Troy Sine, MD;  Location: [place] CV LAB;  Service: Cardiovascular;  Laterality: N/A;  . CARDIAC CATHETERIZATION N/A 08/02/2015   Procedure: Coronary Stent Intervention;  Surgeon: Troy Sine, MD;  Location: [place] CV LAB;  Service: Cardiovascular;  Laterality: N/A;  . CARDIAC CATHETERIZATION N/A 06/25/2016   Procedure: Pericardiocentesis;  Surgeon: Will Meredith Leeds, MD;  Location: [place] CV LAB;  Service: Cardiovascular;  Laterality: N/A;  . cardiac stents    . CHOLECYSTECTOMY OPEN  1990's  . COLONOSCOPY  2005   Dr. Laural Golden: pancolonic divericula, polyp, path unknown currently  . COLONOSCOPY  2012   Dr. Oneida Alar: Normal TI, scattered diverticula in entire colon, small internal hemorrhoids, normal colon biopsies. Colonoscopy in 5-10 years.   . COLOSTOMY  05/1979    . COLOSTOMY REVERSAL  11/1979  . EP IMPLANTABLE DEVICE N/A 06/25/2016   Procedure: Lead Revision/Repair;  Surgeon: Will Meredith Leeds, MD;  Location: [place] CV LAB;  Service: Cardiovascular;  Laterality: N/A;  . EP IMPLANTABLE DEVICE N/A 06/25/2016   Procedure: Pacemaker Implant;  Surgeon: Will Meredith Leeds, MD;  Location: [place] CV LAB;  Service: Cardiovascular;  Laterality: N/A;  . EXCISIONAL HEMORRHOIDECTOMY  1970's  . EYE SURGERY Left 2000   "branch vein occlusion"  . EYE SURGERY Left ~ 2001   "smoothed out wrinkle"  . LEFT OOPHORECTOMY  05/1979   nicked bowel, peritonitis, colostomy; colostomy reversed 1981   . LOWER EXTREMITY ANGIOGRAM N/A 01/03/2014   Procedure: LOWER EXTREMITY ANGIOGRAM;  Surgeon: Wellington Hampshire, MD;  Location: [facility] CATH LAB;  Service: Cardiovascular;  Laterality: N/A;  . Nuclear med stress test  10/2011   Small area of mild ischemia inferoapically.  [name] PARTIAL HYSTERECTOMY  1970's   left ovaries, then ovaries removed later due tumors   . RIGHT OOPHORECTOMY  1970's    Current Outpatient Medications  Medication Sig Dispense Refill  . acetaminophen (TYLENOL) 325 MG tablet Take 650 mg by mouth every 6 (six) hours as needed for headache.    [name] amiodarone (PACERONE) 200 MG tablet Take 1 tablet (200 mg total) by mouth daily. 30 tablet  6  . apixaban (ELIQUIS) 5 MG TABS tablet TAKE (1) TABLET BY MOUTH TWICE DAILY. 180 tablet 3  . atorvastatin (LIPITOR) 80 MG tablet Take 1 tablet (80 mg total) by mouth daily at 6 PM. 30 tablet 6  . cholecalciferol (VITAMIN D) 1000 UNITS tablet Take 50,000 Units by mouth once a week.     . clopidogrel (PLAVIX) 75 MG tablet TAKE ONE (1) TABLET BY MOUTH EVERY DAY 90 tablet 3  . diltiazem (CARDIZEM CD) 360 MG 24 hr capsule Take 1 capsule (360 mg total) by mouth daily. 90 capsule 3  . diphenhydramine-acetaminophen (TYLENOL PM EXTRA STRENGTH) 25-500 MG TABS tablet Take 1 tablet by mouth at bedtime as needed (Take as directed).     .  furosemide (LASIX) 40 MG tablet Take 1.5 tablets (60 mg total) by mouth daily. 90 tablet 3  . glucose blood (FREESTYLE LITE) test strip USE AS DIRECTED TWICE DAILY. 100 each 5  . insulin NPH-regular Human (NOVOLIN 70/30) (70-30) 100 UNIT/ML injection Inject 40 Units into the skin See admin instructions. Inject 25 units before breakfast and 25 units before supper  - only if pre-meal blood glucose is above 90 mg/dL. 10 mL 11  . levothyroxine (SYNTHROID, LEVOTHROID) 175 MCG tablet Take 175 mcg by mouth daily before breakfast.    . losartan (COZAAR) 100 MG tablet TAKE ONE (1) TABLET EACH DAY 90 tablet 3  . metFORMIN (GLUCOPHAGE) 500 MG tablet TAKE ONE TABLET BY MOUTH TWICE A DAY WITH A MEAL 180 tablet 0  . nitroGLYCERIN (NITROSTAT) 0.4 MG SL tablet Place 1 tablet (0.4 mg total) under the tongue every 5 (five) minutes as needed for chest pain. 25 tablet 0  . potassium chloride SA (K-DUR,KLOR-CON) 20 MEQ tablet TAKE ONE (1) TABLET BY MOUTH EVERY DAY 90 tablet 3  . rOPINIRole (REQUIP) 0.5 MG tablet Take 0.5 mg by mouth at bedtime.   0  . carvedilol (COREG) 25 MG tablet Take 1 tablet (25 mg total) by mouth 2 (two) times daily. 180 tablet 3   No current facility-administered medications for this visit.     Allergies as of 12/08/2017 - Review Complete 12/08/2017  Allergen Reaction Noted  . Penicillins Hives   . Percocet [oxycodone-acetaminophen] Nausea And Vomiting 02/27/2012    Vitals: BP 116/73   Pulse 95   Ht 5\' 3"  (1.6 m)   Wt 154 lb (69.9 kg)   BMI 27.28 kg/m  Last Weight:  Wt Readings from Last 1 Encounters:  12/08/17 154 lb (69.9 kg)   PFX:TKWI mass index is 27.28 kg/m.     Last Height:   Ht Readings from Last 1 Encounters:  12/08/17 5\' 3"  (1.6 m)    Physical exam:  General: The patient is awake, alert and appears not in acute distress. The patient is well groomed. Head: Normocephalic, atraumatic. Neck is supple. Mallampati 3  neck circumference: 15.25 . Nasal airflow congested,    TMJ click not  evident . Retrognathia is not seen.  Cardiovascular:  Regular rate and rhythm, without  murmurs or carotid bruit, and without distended neck veins. Respiratory: Lungs are clear to auscultation. Skin:  Without evidence of edema, or rash Trunk: BMI is 27.   Neurologic exam : The patient is awake and alert, oriented to place and time.    Attention span & concentration ability appears normal.  Speech is fluent,  without  dysarthria, dysphonia or aphasia.  Mood and affect are appropriate.  Cranial nerves: Pupils are equal and briskly reactive  to light. Funduscopic exam without  evidence of pallor or edema. Amblyopia left eye, branch vein occlusion. ( eye stroke )  Hearing to finger rub intact.  Facial sensation intact to fine touch. Facial motor strength is symmetric and tongue and uvula move midline. Shoulder shrug was symmetrical.   Motor exam: she has new onset rigor and cog-wheeling in both biceps and at the wrists. Her hand have a minor tremor, not at rest, she was able to walk unassisted and shows normal arm swing, no pill rolling tremor, not stooped.  . Right hand grip strength is stronger than left, bilateral equal strength, no cogwheeling and no rigorSensory:  Fine touch, pinprick and vibration were reduced below the knee level, loss of primary modalities at the ankle level. Patient reports she has peripheral arterial peripheral vascular disease Coordination: Rapid alternating movements in the fingers/hands was normal. Finger-to-nose maneuver  normal without evidence of ataxia, dysmetria or tremor. Gait and station: Patient walks without assistive device- she reports propulsive tendencies when bending- she fell into the dishwasher. Strength within normal limits.  Stance is stable and normal.   Deep tendon reflexes: in the upper and lower extremities are attenuated.The patient was advised of the nature of the diagnosed disorder , the treatment options and risks for general a  health and wellness arising from not treating the condition.  I spent more than 25   minutes of face to face time with the patient. Greater than 50% of time was spent in counseling and coordination of care. We have discussed the diagnosis and differential and I answered the patient's questions.    Assessment:  After physical and neurologic examination, review of laboratory studies,  Personal review of imaging studies, reports of other /same  Imaging studies ,  Results of polysomnography/ neurophysiology testing and pre-existing records as far as provided in visit., my assessment is   1) Patient has peripheral arterial disease, ankle edema,  atrial fibrillation, paroxysmal atrial fibrillation with high embolism risk, is on chronic anticoagulation, with a history of 5 admissions in 12 months for atrial fibrillation related to congestive heart failure, in addition history of TIA and stroke, retinal vein occlusion, coronary artery disease and myocardial infarction.  She has parkinsonian tremor and rigor bilaterally- over both biceps and wrists. No associted romberg, no pill rolling tremor, no loss of facial mimik or rare blink reflex.   2) OSA treated at 10 cm water pressure CPAP with FFM , [employer] SIMPLUS in small size. She is doing well.   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    Asencion Partridge Billiejean Schimek MD  12/08/2017   CC: Sharilyn Sites, [place], [place] [address]

## 2017-12-08 NOTE — Patient Instructions (Signed)
Amiodarone tablets What is this medicine? AMIODARONE (a MEE oh da rone) is an antiarrhythmic drug. It helps make your heart beat regularly. Because of the side effects caused by this medicine, it is only used when other medicines have not worked. It is usually used for heartbeat problems that may be life threatening. This medicine may be used for other purposes; ask your health care provider or pharmacist if you have questions. COMMON BRAND NAME(S): Cordarone, Pacerone What should I tell my health care provider before I take this medicine? They need to know if you have any of these conditions: -liver disease -lung disease -other heart problems -thyroid disease -an unusual or allergic reaction to amiodarone, iodine, other medicines, foods, dyes, or preservatives -pregnant or trying to get pregnant -breast-feeding How should I use this medicine? Take this medicine by mouth with a glass of water. Follow the directions on the prescription label. You can take this medicine with or without food. However, you should always take it the same way each time. Take your doses at regular intervals. Do not take your medicine more often than directed. Do not stop taking except on the advice of your doctor or health care professional. A special MedGuide will be given to you by the pharmacist with each prescription and refill. Be sure to read this information carefully each time. Talk to your pediatrician regarding the use of this medicine in children. Special care may be needed. Overdosage: If you think you have taken too much of this medicine contact a poison control center or emergency room at once. NOTE: This medicine is only for you. Do not share this medicine with others. What if I miss a dose? If you miss a dose, take it as soon as you can. If it is almost time for your next dose, take only that dose. Do not take double or extra doses. What may interact with this medicine? Do not take this medicine with  any of the following medications: -abarelix -apomorphine -arsenic trioxide -certain antibiotics like erythromycin, gemifloxacin, levofloxacin, pentamidine -certain medicines for depression like amoxapine, tricyclic antidepressants -certain medicines for fungal infections like fluconazole, itraconazole, ketoconazole, posaconazole, voriconazole -certain medicines for irregular heart beat like disopyramide, dofetilide, dronedarone, ibutilide, propafenone, sotalol -certain medicines for malaria like chloroquine, halofantrine -cisapride -droperidol -haloperidol -hawthorn -maprotiline -methadone -phenothiazines like chlorpromazine, mesoridazine, thioridazine -pimozide -ranolazine -red yeast rice -vardenafil -ziprasidone This medicine may also interact with the following medications: -antiviral medicines for HIV or AIDS -certain medicines for blood pressure, heart disease, irregular heart beat -certain medicines for cholesterol like atorvastatin, cerivastatin, lovastatin, simvastatin -certain medicines for hepatitis C like sofosbuvir and ledipasvir; sofosbuvir -certain medicines for seizures like phenytoin -certain medicines for thyroid problems -certain medicines that treat or prevent blood clots like warfarin -cholestyramine -cimetidine -clopidogrel -cyclosporine -dextromethorphan -diuretics -fentanyl -general anesthetics -grapefruit juice -lidocaine -loratadine -methotrexate -other medicines that prolong the QT interval (cause an abnormal heart rhythm) -procainamide -quinidine -rifabutin, rifampin, or rifapentine -St. John's Wort -trazodone This list may not describe all possible interactions. Give your health care provider a list of all the medicines, herbs, non-prescription drugs, or dietary supplements you use. Also tell them if you smoke, drink alcohol, or use illegal drugs. Some items may interact with your medicine. What should I watch for while using this  medicine? Your condition will be monitored closely when you first begin therapy. Often, this drug is first started in a hospital or other monitored health care setting. Once you are on maintenance therapy, visit your doctor or  health care professional for regular checks on your progress. Because your condition and use of this medicine carry some risk, it is a good idea to carry an identification card, necklace or bracelet with details of your condition, medications, and doctor or health care professional. Dennis Bast may get drowsy or dizzy. Do not drive, use machinery, or do anything that needs mental alertness until you know how this medicine affects you. Do not stand or sit up quickly, especially if you are an older patient. This reduces the risk of dizzy or fainting spells. This medicine can make you more sensitive to the sun. Keep out of the sun. If you cannot avoid being in the sun, wear protective clothing and use sunscreen. Do not use sun lamps or tanning beds/booths. You should have regular eye exams before and during treatment. Call your doctor if you have blurred vision, see halos, or your eyes become sensitive to light. Your eyes may get dry. It may be helpful to use a lubricating eye solution or artificial tears solution. If you are going to have surgery or a procedure that requires contrast dyes, tell your doctor or health care professional that you are taking this medicine. What side effects may I notice from receiving this medicine? Side effects that you should report to your doctor or health care professional as soon as possible: -allergic reactions like skin rash, itching or hives, swelling of the face, lips, or tongue -blue-gray coloring of the skin -blurred vision, seeing blue green halos, increased sensitivity of the eyes to light -breathing problems -chest pain -dark urine -fast, irregular heartbeat -feeling faint or light-headed -intolerance to heat or cold -nausea or vomiting -pain  and swelling of the scrotum -pain, tingling, numbness in feet, hands -redness, blistering, peeling or loosening of the skin, including inside the mouth -spitting up blood -stomach pain -sweating -unusual or uncontrolled movements of body -unusually weak or tired -weight gain or loss -yellowing of the eyes or skin Side effects that usually do not require medical attention (report to your doctor or health care professional if they continue or are bothersome): -change in sex drive or performance -constipation -dizziness -headache -loss of appetite -trouble sleeping This list may not describe all possible side effects. Call your doctor for medical advice about side effects. You may report side effects to FDA at 1-800-FDA-1088. Where should I keep my medicine? Keep out of the reach of children. Store at room temperature between 20 and 25 degrees C (68 and 77 degrees F). Protect from light. Keep container tightly closed. Throw away any unused medicine after the expiration date. NOTE: This sheet is a summary. It may not cover all possible information. If you have questions about this medicine, talk to your doctor, pharmacist, or health care provider.  2018 Elsevier/Gold Standard (2013-09-11 19:48:11)

## 2017-12-10 ENCOUNTER — Other Ambulatory Visit: Payer: Self-pay

## 2017-12-10 DIAGNOSIS — Z79899 Other long term (current) drug therapy: Secondary | ICD-10-CM

## 2017-12-13 ENCOUNTER — Encounter: Payer: Self-pay | Admitting: Cardiology

## 2017-12-13 ENCOUNTER — Telehealth: Payer: Self-pay | Admitting: Cardiology

## 2017-12-13 NOTE — Telephone Encounter (Signed)
Follow up   Pt returning call for [facility]

## 2017-12-13 NOTE — Telephone Encounter (Signed)
lmtcb

## 2017-12-13 NOTE — Telephone Encounter (Signed)
Pt reports having more AFib than usual.  States that it goes out of rhythm daily. Pt scheduled to see Dr. Curt Bears tomorrow to evaluate and check PPM and update care plan. Pt appreciates our help with this and glad she can be seen quickly. She is agreeable to appt tomorrow.

## 2017-12-14 ENCOUNTER — Ambulatory Visit: Payer: PPO | Admitting: Cardiology

## 2017-12-14 ENCOUNTER — Encounter: Payer: Self-pay | Admitting: Cardiology

## 2017-12-14 VITALS — BP 130/78 | HR 79 | Ht 63.0 in | Wt 157.4 lb

## 2017-12-14 DIAGNOSIS — I10 Essential (primary) hypertension: Secondary | ICD-10-CM | POA: Diagnosis not present

## 2017-12-14 DIAGNOSIS — I495 Sick sinus syndrome: Secondary | ICD-10-CM

## 2017-12-14 DIAGNOSIS — Z01812 Encounter for preprocedural laboratory examination: Secondary | ICD-10-CM

## 2017-12-14 DIAGNOSIS — I2510 Atherosclerotic heart disease of native coronary artery without angina pectoris: Secondary | ICD-10-CM

## 2017-12-14 DIAGNOSIS — I480 Paroxysmal atrial fibrillation: Secondary | ICD-10-CM | POA: Diagnosis not present

## 2017-12-14 DIAGNOSIS — I484 Atypical atrial flutter: Secondary | ICD-10-CM | POA: Diagnosis not present

## 2017-12-14 DIAGNOSIS — S93402A Sprain of unspecified ligament of left ankle, initial encounter: Secondary | ICD-10-CM | POA: Diagnosis not present

## 2017-12-14 NOTE — Patient Instructions (Addendum)
Medication Instructions:  Your physician recommends that you continue on your current medications as directed. Please refer to the Current Medication list given to you today.  Labwork: BMET & CBC today     *We will only notify you of abnormal results, otherwise continue current treatment plan.  Testing/Procedures: Your physician has recommended that you have a Cardioversion (DCCV). Electrical Cardioversion uses a jolt of electricity to your heart either through paddles or wired patches attached to your chest. This is a controlled, usually prescheduled, procedure. Defibrillation is done under light anesthesia in the hospital, and you usually go home the day of the procedure. This is done to get your heart back into a normal rhythm. You are not awake for the procedure.  CARDIOVERSION INSTRUCTIONS: Please arrive at the [facility] main entrance of [facility] hospital on 12/15/2017 at 8:00 a.m. Do not eat or drink after midnight prior to procedure Do not take your Lasix, Metformin or Insulin the morning of this procedure. Take your remaining medications as normal with a sip of water. You will need someone to drive you home at discharge   Follow-Up: Your physician recommends that you schedule a follow-up appointment in: 3 months with Dr. Curt Bears.   * If you need a refill on your cardiac medications before your next appointment, please call your pharmacy.   *Please note that any paperwork needing to be filled out by the provider will need to be addressed at the front desk prior to seeing the provider. Please note that any FMLA, disability or other documents regarding health condition is subject to a $25.00 charge that must be received prior to completion of paperwork in the form of a money order or check.  Thank you for choosing [facility]!!   Trinidad Curet, RN [phone]  Any Other Special Instructions Will Be Listed Below (If Applicable).   Electrical  Cardioversion  Electrical cardioversion is the delivery of a jolt of electricity to restore a normal rhythm to the heart. A rhythm that is too fast or is not regular keeps the heart from pumping well. In this procedure, sticky patches or metal paddles are placed on the chest to deliver electricity to the heart from a device. This procedure may be done in an emergency if:  There is low or no blood pressure as a result of the heart rhythm.  Normal rhythm must be restored as fast as possible to protect the brain and heart from further damage.  It may save a life.  This procedure may also be done for irregular or fast heart rhythms that are not immediately life-threatening. Tell a health care provider about:  Any allergies you have.  All medicines you are taking, including vitamins, herbs, eye drops, creams, and over-the-counter medicines.  Any problems you or family members have had with anesthetic medicines.  Any blood disorders you have.  Any surgeries you have had.  Any medical conditions you have.  Whether you are pregnant or may be pregnant. What are the risks? Generally, this is a safe procedure. However, problems may occur, including:  Allergic reactions to medicines.  A blood clot that breaks free and travels to other parts of your body.  The possible return of an abnormal heart rhythm within hours or days after the procedure.  Your heart stopping (cardiac arrest). This is rare.  What happens before the procedure? Medicines  Your health care provider may have you start taking: ? Blood-thinning medicines (anticoagulants) so your blood does not clot as  easily. ? Medicines may be given to help stabilize your heart rate and rhythm.  Ask your health care provider about changing or stopping your regular medicines. This is especially important if you are taking diabetes medicines or blood thinners. General instructions  Plan to have someone take you home from the  hospital or clinic.  If you will be going home right after the procedure, plan to have someone with you for 24 hours.  Follow instructions from your health care provider about eating or drinking restrictions. What happens during the procedure?  To lower your risk of infection: ? Your health care team will wash or sanitize their hands. ? Your skin will be washed with soap.  An IV tube will be inserted into one of your veins.  You will be given a medicine to help you relax (sedative).  Sticky patches (electrodes) or metal paddles may be placed on your chest.  An electrical shock will be delivered. The procedure may vary among health care providers and hospitals. What happens after the procedure?  Your blood pressure, heart rate, breathing rate, and blood oxygen level will be monitored until the medicines you were given have worn off.  Do not drive for 24 hours if you were given a sedative.     Your heart rhythm will be watched to make sure it does not change. This information is not intended to replace advice given to you by your health care provider. Make sure you discuss any questions you have with your health care provider. Document Released: 05/29/2002 Document Revised: 02/05/2016 Document Reviewed: 12/13/2015 Elsevier Interactive Patient Education  2017 [employer].

## 2017-12-14 NOTE — Progress Notes (Signed)
Electrophysiology Office Note   Date:  12/14/2017   ID:  Virginia Huber, Virginia Huber [birth year], MRN [id number]  PCP:  Sharilyn Sites, MD  Cardiologist:  Bronson Ing Primary Electrophysiologist:  Will Meredith Leeds, MD    Chief Complaint  Patient presents with  . Pacemaker Check    PAF     History of Present Illness: Virginia Huber is a 72 y.o. female who presents today for electrophysiology evaluation.   She has a history of CAD status post MI and PCI of the OM1, with an an STEMI 03/2015 status post DES to the circumflex, an STEMI 07/2015 with PCI to the RCA and paroxysmal atrial fibrillation during that admission. She is on Eliquis. Sleep study showed mild sleep apnea. He was in the hospital on 12/17 and was found to be in A. fib with RVR. She converted to sinus rhythm after arrival. He was readmitted to the hospital on 12/23 and was noted to be in atrial flutter with rapid rates at that time. She had junctional rhythm and near-syncope after conversion to sinus rhythm. She had a postconversion pause of 5.6 seconds. Pacemaker placed 06/25/16 with lead revision 06/26/16. Device implant was complicated by pericardial effusion and tamponade requiring emergent pericardiocentesis.  Today, denies symptoms of palpitations, chest pain, shortness of breath, orthopnea, PND, lower extremity edema, claudication, dizziness, presyncope, syncope, bleeding, or neurologic sequela. The patient is tolerating medications without difficulties.  Symptoms today are weakness, fatigue, and some mild chest discomfort.  This started 2 weeks ago.  Device interrogation shows that that is the point when she went into atrial flutter.  We did try to pace her out of atrial flutter which did not convert her to sinus rhythm in clinic today.  Past Medical History:  Diagnosis Date  . Arthritis   . Atrial fibrillation and flutter ([place])    a. h/o PAF/flutter during admission in 2013 for PNA. b. PAF during adm for NSTEMI 07/2015,  subsequent paroxysms since then.  . B12 deficiency anemia   . Cardiac tamponade 06/2016  . Coronary artery disease 11/30/2014   a. remote MI. b. h/o PTCA with scoring balloon to OM1 11/2014. c. NSTEMI 03/2015 s/p DES to prox-mid Cx. d. NSTEMI 07/2015 s/p scoring balloon/PTCA/DES to dRCA with PAF during that admission  . Cutaneous lupus erythematosus   . GERD (gastroesophageal reflux disease)   . Heart block   . History of blood transfusion 1980's   2nd surgical procedures  . HTN (hypertension)   . Hypercholesteremia   . Hypothyroidism   . Myocardial infarction ([place]) 02/2012  . Ovarian tumor   . PAD (peripheral artery disease) ([place])    a. s/p LE angio 2015; followed by Dr. Fletcher Anon - managed medically.  . Pain with urination 05/08/2015  . Paroxysmal atrial flutter ([place])   . Pericardial effusion    a. 06/2016 after ppm - s/p pericardiocentesis.  . Superficial fungus infection of skin 06/29/2013  . Tachy-brady syndrome ([place])    a. s/p Medtronic PPM 06/2016, c/b lead perf/pericardial effusion.  [name] TIA (transient ischemic attack) 08/2001; ~ 2006  . Type II diabetes mellitus ([place])   . UTI (urinary tract infection) 05/08/2013   Past Surgical History:  Procedure Laterality Date  . ABDOMINAL AORTAGRAM N/A 01/03/2014   Procedure: ABDOMINAL [name];  Surgeon: Wellington Hampshire, MD;  Location: [facility] CATH LAB;  Service: Cardiovascular;  Laterality: N/A;  . ABDOMINAL HYSTERECTOMY  1972   "partial"  . APPENDECTOMY  1970's  . CARDIAC CATHETERIZATION  2008  Tiny OM-2 with 90% narrowing. Med tx.  [name] CARDIAC CATHETERIZATION N/A 11/30/2014   Procedure: Left Heart Cath and Coronary Angiography;  Surgeon: Troy Sine, MD; LAD 20%, CFX 50%, OM1 95%, right PLB 30%, LV normal   . CARDIAC CATHETERIZATION N/A 11/30/2014   Procedure: Coronary Balloon Angioplasty;  Surgeon: Troy Sine, MD;  Angiosculpt scoring balloon and PTCA to the OM1 reducing stenosis from 95% to less than 10%  . CARDIAC CATHETERIZATION N/A  04/03/2015   Procedure: Left Heart Cath and Coronary Angiography;  Surgeon: Jolaine Artist, MD; dLAD 50%, CFX 90%, OM1 100%, PLA 15%, LVEDP 13    . CARDIAC CATHETERIZATION N/A 04/03/2015   Procedure: Coronary Stent Intervention;  Surgeon: Sherren Mocha, MD; 3.0x18 mm Xience DES to the CFX    . CARDIAC CATHETERIZATION N/A 08/02/2015   Procedure: Left Heart Cath and Coronary Angiography;  Surgeon: Troy Sine, MD;  Location: [facility];  Service: Cardiovascular;  Laterality: N/A;  . CARDIAC CATHETERIZATION N/A 08/02/2015   Procedure: Coronary Stent Intervention;  Surgeon: Troy Sine, MD;  Location: [facility];  Service: Cardiovascular;  Laterality: N/A;  . CARDIAC CATHETERIZATION N/A 06/25/2016   Procedure: Pericardiocentesis;  Surgeon: Will Meredith Leeds, MD;  Location: [facility];  Service: Cardiovascular;  Laterality: N/A;  . cardiac stents    . CHOLECYSTECTOMY OPEN  1990's  . COLONOSCOPY  2005   Dr. Laural Golden: pancolonic divericula, polyp, path unknown currently  . COLONOSCOPY  2012   Dr. Oneida Alar: Normal TI, scattered diverticula in entire colon, small internal hemorrhoids, normal colon biopsies. Colonoscopy in 5-10 years.   . COLOSTOMY  05/1979  . COLOSTOMY REVERSAL  11/1979  . EP IMPLANTABLE DEVICE N/A 06/25/2016   Procedure: Lead Revision/Repair;  Surgeon: Will Meredith Leeds, MD;  Location: [facility] CV LAB;  Service: Cardiovascular;  Laterality: N/A;  . EP IMPLANTABLE DEVICE N/A 06/25/2016   Procedure: Pacemaker Implant;  Surgeon: Will Meredith Leeds, MD;  Location: [facility] CV LAB;  Service: Cardiovascular;  Laterality: N/A;  . EXCISIONAL HEMORRHOIDECTOMY  1970's  . EYE SURGERY Left 2000   "branch vein occlusion"  . EYE SURGERY Left ~ 2001   "smoothed out wrinkle"  . LEFT OOPHORECTOMY  05/1979   nicked bowel, peritonitis, colostomy; colostomy reversed 1981   . LOWER EXTREMITY ANGIOGRAM N/A 01/03/2014   Procedure: LOWER EXTREMITY ANGIOGRAM;  Surgeon:  Wellington Hampshire, MD;  Location: [facility] CATH LAB;  Service: Cardiovascular;  Laterality: N/A;  . Nuclear med stress test  10/2011   Small area of mild ischemia inferoapically.  [name] PARTIAL HYSTERECTOMY  1970's   left ovaries, then ovaries removed later due tumors   . RIGHT OOPHORECTOMY  1970's     Current Outpatient Medications  Medication Sig Dispense Refill  . acetaminophen (TYLENOL) 325 MG tablet Take 650 mg by mouth every 6 (six) hours as needed for headache.    [name] amiodarone (PACERONE) 200 MG tablet Take 1 tablet (200 mg total) by mouth daily. 30 tablet 6  . apixaban (ELIQUIS) 5 MG TABS tablet TAKE (1) TABLET BY MOUTH TWICE DAILY. 180 tablet 3  . atorvastatin (LIPITOR) 80 MG tablet Take 1 tablet (80 mg total) by mouth daily at 6 PM. 30 tablet 6  . cholecalciferol (VITAMIN D) 1000 UNITS tablet Take 50,000 Units by mouth once a week.     . clopidogrel (PLAVIX) 75 MG tablet TAKE ONE (1) TABLET BY MOUTH EVERY DAY 90 tablet 3  . diltiazem (CARDIZEM  CD) 360 MG 24 hr capsule Take 1 capsule (360 mg total) by mouth daily. 90 capsule 3  . diphenhydramine-acetaminophen (TYLENOL PM EXTRA STRENGTH) 25-500 MG TABS tablet Take 1 tablet by mouth at bedtime as needed (Take as directed).     . furosemide (LASIX) 40 MG tablet Take 1.5 tablets (60 mg total) by mouth daily. 90 tablet 3  . glucose blood (FREESTYLE LITE) test strip USE AS DIRECTED TWICE DAILY. 100 each 5  . insulin NPH-regular Human (NOVOLIN 70/30) (70-30) 100 UNIT/ML injection Inject 40 Units into the skin See admin instructions. Inject 25 units before breakfast and 25 units before supper  - only if pre-meal blood glucose is above 90 mg/dL. 10 mL 11  . levothyroxine (SYNTHROID, LEVOTHROID) 175 MCG tablet Take 175 mcg by mouth daily before breakfast.    . losartan (COZAAR) 100 MG tablet TAKE ONE (1) TABLET EACH DAY 90 tablet 3  . metFORMIN (GLUCOPHAGE) 500 MG tablet TAKE ONE TABLET BY MOUTH TWICE A DAY WITH A MEAL 180 tablet 0  . nitroGLYCERIN  (NITROSTAT) 0.4 MG SL tablet Place 1 tablet (0.4 mg total) under the tongue every 5 (five) minutes as needed for chest pain. 25 tablet 0  . potassium chloride SA (K-DUR,KLOR-CON) 20 MEQ tablet TAKE ONE (1) TABLET BY MOUTH EVERY DAY 90 tablet 3  . rOPINIRole (REQUIP) 0.5 MG tablet Take 0.5 mg by mouth at bedtime.   0  . carvedilol (COREG) 25 MG tablet Take 1 tablet (25 mg total) by mouth 2 (two) times daily. 180 tablet 3   No current facility-administered medications for this visit.     Allergies:   Penicillins and Percocet [oxycodone-acetaminophen]   Social History:  The patient  reports that she has never smoked. She has never used smokeless tobacco. She reports that she does not drink alcohol or use drugs.   Family History:  The patient's family history includes Diabetes in her brother; Heart disease in her brother, brother, father, mother, and sister; Lupus in her daughter; Thyroid disease in her brother and brother.    ROS:  Please see the history of present illness.   Otherwise, review of systems is positive for chest pain, leg swelling, shortness of breath, palpitations.   All other systems are reviewed and negative.   PHYSICAL EXAM: VS:  BP 130/78   Pulse 79   Ht 5\' 3"  (1.6 m)   Wt 157 lb 6.4 oz (71.4 kg)   SpO2 96%   BMI 27.88 kg/m  , BMI Body mass index is 27.88 kg/m. GEN: Well nourished, well developed, in no acute distress  HEENT: normal  Neck: no JVD, carotid bruits, or masses Cardiac: iRRR; no murmurs, rubs, or gallops,no edema  Respiratory:  clear to auscultation bilaterally, normal work of breathing GI: soft, nontender, nondistended, + BS MS: no deformity or atrophy  Skin: warm and dry, device site well healed Neuro:  Strength and sensation are intact Psych: euthymic mood, full affect  EKG:  EKG is ordered today. Personal review of the ekg ordered shows atrial flutter, rate 79  Personal review of the device interrogation today. Results in [facility]: 11/25/2017: ALT 165; B Natriuretic Peptide 264.0; Hemoglobin 12.3; Platelets 305 12/06/2017: BUN 21; Creat 1.47; Potassium 4.1; Sodium 139; TSH 0.05    Lipid Panel     Component Value Date/Time   CHOL 166 08/05/2017 0807   TRIG 160 (H) 08/05/2017 0807   HDL 43 (L) 08/05/2017 0807   CHOLHDL 3.9  08/05/2017 0807   VLDL NOT CALC 04/14/2016 0910   LDLCALC 96 08/05/2017 0807     Wt Readings from Last 3 Encounters:  12/14/17 157 lb 6.4 oz (71.4 kg)  12/08/17 154 lb (69.9 kg)  12/02/17 157 lb 6.4 oz (71.4 kg)      Other studies Reviewed: Additional studies/ records that were reviewed today include: TTE 08/04/17, Cath  08/02/15 Review of the above records today demonstrates:  - Left ventricle: The cavity size was normal. Wall thickness was   increased in a pattern of moderate LVH. Systolic function was   normal. The estimated ejection fraction was in the range of 55%   to 60%. Wall motion was normal; there were no regional wall   motion abnormalities. Features are consistent with a pseudonormal   left ventricular filling pattern, with concomitant abnormal   relaxation and increased filling pressure (grade 2 diastolic   dysfunction). Doppler parameters are consistent with high   ventricular filling pressure. - Left atrium: The atrium was severely dilated. - Right ventricle: Pacer wire or catheter noted in right ventricle. - Right atrium: Pacer wire or catheter noted in right atrium.   1st RPLB lesion, 50% stenosed.  Dist RCA lesion, 30% stenosed.  Ost 1st Mrg to 1st Mrg lesion, 100% stenosed.  Ost LAD lesion, 40% stenosed.  Mid LAD lesion, 40% stenosed.  Post Atrio lesion, 90% stenosed. Post intervention, there is a 0% residual stenosis.  The left ventricular systolic function is normal.  Mid RCA lesion, 20% stenosed.   Normal LV function without focal segmental wall motion abnormalities and ejection fraction of 55%.  Significant coronary obstructive disease with  diffuse mild luminal narrowing of the LAD with 40% proximal and mid stenoses; widely patent stent in the proximal circumflex with old occlusion of the marginal branch which had arisen in the region of the stented segment but with evidence for very faint collateralization to this distal marginal vessel from the the LAD; a large dominant RCA with 20%, mild mid narrowing, 30% narrowing after the acute margin and focal 90% stenosis distally prior to a PDA 2 vessel with 50% narrowing at the ostium of this PDA prior to a bend in the vessel with the RCA ending in the PLA vessel.  Successful percutaneous cardiac intervention to the distal RCA treated with Angiosculpt scoring balloon, PTCA, and ultimate stenting with a 2.58 mm Xience Alpine DES stent postdilated to 2.51 mm with a 90% stenosis reduced to 0% and no change in the ostial PDA narrowing.  ASSESSMENT AND PLAN:  1.  Paroxysmal atrial fibrillation with tachybradycardia syndrome: That is post Medtronic dual-chamber pacemaker.  Device functioning appropriately.  She is unfortunately in atrial flutter today.  We will plan for cardioversion.  She is currently on amiodarone.  Would prefer to have her on a medication without such serious side effects.  We will plan to potentially switch her to Multaq at the next visit.  This patients CHA2DS2-VASc Score and unadjusted Ischemic Stroke Rate (% per year) is equal to 11.2 % stroke rate/year from a score of 7  Above score calculated as 1 point each if present [CHF, HTN, DM, Vascular=MI/PAD/Aortic Plaque, Age if 65-74, or Female] Above score calculated as 2 points each if present [Age > 75, or Stroke/TIA/TE]   2. Coronary artery disease: Minimal chest pain.  No changes.  3. Hypertension: Blood pressure is well-controlled today. Unfortunately sh blood pressure well controlled.  No changes.  Current medicines are reviewed at length with the  patient today.   The patient does not have concerns regarding her  medicines.  The following changes were made today: None  Labs/ tests ordered today include:  Orders Placed This Encounter  Procedures  . Basic Metabolic Panel (BMET)  . CBC  . EKG 12-Lead     Disposition:   FU with Will Camnitz 3 months  Signed, Will Meredith Leeds, MD  12/14/2017 12:48 PM     [place] [address] [place] [address] [phone] (office) [phone] (fax)

## 2017-12-14 NOTE — H&P (View-Only) (Signed)
Electrophysiology Office Note   Date:  12/14/2017   ID:  Virginia Huber, Virginia Huber [birth year], MRN [id number]  PCP:  Sharilyn Sites, MD  Cardiologist:  Bronson Ing Primary Electrophysiologist:  Will Meredith Leeds, MD    Chief Complaint  Patient presents with  . Pacemaker Check    PAF     History of Present Illness: Virginia Huber is a 72 y.o. female who presents today for electrophysiology evaluation.   She has a history of CAD status post MI and PCI of the OM1, with an an STEMI 03/2015 status post DES to the circumflex, an STEMI 07/2015 with PCI to the RCA and paroxysmal atrial fibrillation during that admission. She is on Eliquis. Sleep study showed mild sleep apnea. He was in the hospital on 12/17 and was found to be in A. fib with RVR. She converted to sinus rhythm after arrival. He was readmitted to the hospital on 12/23 and was noted to be in atrial flutter with rapid rates at that time. She had junctional rhythm and near-syncope after conversion to sinus rhythm. She had a postconversion pause of 5.6 seconds. Pacemaker placed 06/25/16 with lead revision 06/26/16. Device implant was complicated by pericardial effusion and tamponade requiring emergent pericardiocentesis.  Today, denies symptoms of palpitations, chest pain, shortness of breath, orthopnea, PND, lower extremity edema, claudication, dizziness, presyncope, syncope, bleeding, or neurologic sequela. The patient is tolerating medications without difficulties.  Symptoms today are weakness, fatigue, and some mild chest discomfort.  This started 2 weeks ago.  Device interrogation shows that that is the point when she went into atrial flutter.  We did try to pace her out of atrial flutter which did not convert her to sinus rhythm in clinic today.  Past Medical History:  Diagnosis Date  . Arthritis   . Atrial fibrillation and flutter ([place])    a. h/o PAF/flutter during admission in 2013 for PNA. b. PAF during adm for NSTEMI 07/2015,  subsequent paroxysms since then.  . B12 deficiency anemia   . Cardiac tamponade 06/2016  . Coronary artery disease 11/30/2014   a. remote MI. b. h/o PTCA with scoring balloon to OM1 11/2014. c. NSTEMI 03/2015 s/p DES to prox-mid Cx. d. NSTEMI 07/2015 s/p scoring balloon/PTCA/DES to dRCA with PAF during that admission  . Cutaneous lupus erythematosus   . GERD (gastroesophageal reflux disease)   . Heart block   . History of blood transfusion 1980's   2nd surgical procedures  . HTN (hypertension)   . Hypercholesteremia   . Hypothyroidism   . Myocardial infarction ([place]) 02/2012  . Ovarian tumor   . PAD (peripheral artery disease) ([place])    a. s/p LE angio 2015; followed by Dr. Fletcher Anon - managed medically.  . Pain with urination 05/08/2015  . Paroxysmal atrial flutter ([place])   . Pericardial effusion    a. 06/2016 after ppm - s/p pericardiocentesis.  . Superficial fungus infection of skin 06/29/2013  . Tachy-brady syndrome ([place])    a. s/p Medtronic PPM 06/2016, c/b lead perf/pericardial effusion.  [name] TIA (transient ischemic attack) 08/2001; ~ 2006  . Type II diabetes mellitus ([place])   . UTI (urinary tract infection) 05/08/2013   Past Surgical History:  Procedure Laterality Date  . ABDOMINAL AORTAGRAM N/A 01/03/2014   Procedure: ABDOMINAL [name];  Surgeon: Wellington Hampshire, MD;  Location: [facility];  Service: Cardiovascular;  Laterality: N/A;  . ABDOMINAL HYSTERECTOMY  1972   "partial"  . APPENDECTOMY  1970's  . CARDIAC CATHETERIZATION  2008  Tiny OM-2 with 90% narrowing. Med tx.  [name] CARDIAC CATHETERIZATION N/A 11/30/2014   Procedure: Left Heart Cath and Coronary Angiography;  Surgeon: Troy Sine, MD; LAD 20%, CFX 50%, OM1 95%, right PLB 30%, LV normal   . CARDIAC CATHETERIZATION N/A 11/30/2014   Procedure: Coronary Balloon Angioplasty;  Surgeon: Troy Sine, MD;  Angiosculpt scoring balloon and PTCA to the OM1 reducing stenosis from 95% to less than 10%  . CARDIAC CATHETERIZATION N/A  04/03/2015   Procedure: Left Heart Cath and Coronary Angiography;  Surgeon: Jolaine Artist, MD; dLAD 50%, CFX 90%, OM1 100%, PLA 15%, LVEDP 13    . CARDIAC CATHETERIZATION N/A 04/03/2015   Procedure: Coronary Stent Intervention;  Surgeon: Sherren Mocha, MD; 3.0x18 mm Xience DES to the CFX    . CARDIAC CATHETERIZATION N/A 08/02/2015   Procedure: Left Heart Cath and Coronary Angiography;  Surgeon: Troy Sine, MD;  Location: [facility];  Service: Cardiovascular;  Laterality: N/A;  . CARDIAC CATHETERIZATION N/A 08/02/2015   Procedure: Coronary Stent Intervention;  Surgeon: Troy Sine, MD;  Location: [facility];  Service: Cardiovascular;  Laterality: N/A;  . CARDIAC CATHETERIZATION N/A 06/25/2016   Procedure: Pericardiocentesis;  Surgeon: Will Meredith Leeds, MD;  Location: [facility];  Service: Cardiovascular;  Laterality: N/A;  . cardiac stents    . CHOLECYSTECTOMY OPEN  1990's  . COLONOSCOPY  2005   Dr. Laural Golden: pancolonic divericula, polyp, path unknown currently  . COLONOSCOPY  2012   Dr. Oneida Alar: Normal TI, scattered diverticula in entire colon, small internal hemorrhoids, normal colon biopsies. Colonoscopy in 5-10 years.   . COLOSTOMY  05/1979  . COLOSTOMY REVERSAL  11/1979  . EP IMPLANTABLE DEVICE N/A 06/25/2016   Procedure: Lead Revision/Repair;  Surgeon: Will Meredith Leeds, MD;  Location: [facility];  Service: Cardiovascular;  Laterality: N/A;  . EP IMPLANTABLE DEVICE N/A 06/25/2016   Procedure: Pacemaker Implant;  Surgeon: Will Meredith Leeds, MD;  Location: [facility];  Service: Cardiovascular;  Laterality: N/A;  . EXCISIONAL HEMORRHOIDECTOMY  1970's  . EYE SURGERY Left 2000   "branch vein occlusion"  . EYE SURGERY Left ~ 2001   "smoothed out wrinkle"  . LEFT OOPHORECTOMY  05/1979   nicked bowel, peritonitis, colostomy; colostomy reversed 1981   . LOWER EXTREMITY ANGIOGRAM N/A 01/03/2014   Procedure: LOWER EXTREMITY ANGIOGRAM;  Surgeon:  Wellington Hampshire, MD;  Location: [facility];  Service: Cardiovascular;  Laterality: N/A;  . Nuclear med stress test  10/2011   Small area of mild ischemia inferoapically.  [name] PARTIAL HYSTERECTOMY  1970's   left ovaries, then ovaries removed later due tumors   . RIGHT OOPHORECTOMY  1970's     Current Outpatient Medications  Medication Sig Dispense Refill  . acetaminophen (TYLENOL) 325 MG tablet Take 650 mg by mouth every 6 (six) hours as needed for headache.    [name] amiodarone (PACERONE) 200 MG tablet Take 1 tablet (200 mg total) by mouth daily. 30 tablet 6  . apixaban (ELIQUIS) 5 MG TABS tablet TAKE (1) TABLET BY MOUTH TWICE DAILY. 180 tablet 3  . atorvastatin (LIPITOR) 80 MG tablet Take 1 tablet (80 mg total) by mouth daily at 6 PM. 30 tablet 6  . cholecalciferol (VITAMIN D) 1000 UNITS tablet Take 50,000 Units by mouth once a week.     . clopidogrel (PLAVIX) 75 MG tablet TAKE ONE (1) TABLET BY MOUTH EVERY DAY 90 tablet 3  . diltiazem (CARDIZEM  CD) 360 MG 24 hr capsule Take 1 capsule (360 mg total) by mouth daily. 90 capsule 3  . diphenhydramine-acetaminophen (TYLENOL PM EXTRA STRENGTH) 25-500 MG TABS tablet Take 1 tablet by mouth at bedtime as needed (Take as directed).     . furosemide (LASIX) 40 MG tablet Take 1.5 tablets (60 mg total) by mouth daily. 90 tablet 3  . glucose blood (FREESTYLE LITE) test strip USE AS DIRECTED TWICE DAILY. 100 each 5  . insulin NPH-regular Human (NOVOLIN 70/30) (70-30) 100 UNIT/ML injection Inject 40 Units into the skin See admin instructions. Inject 25 units before breakfast and 25 units before supper  - only if pre-meal blood glucose is above 90 mg/dL. 10 mL 11  . levothyroxine (SYNTHROID, LEVOTHROID) 175 MCG tablet Take 175 mcg by mouth daily before breakfast.    . losartan (COZAAR) 100 MG tablet TAKE ONE (1) TABLET EACH DAY 90 tablet 3  . metFORMIN (GLUCOPHAGE) 500 MG tablet TAKE ONE TABLET BY MOUTH TWICE A DAY WITH A MEAL 180 tablet 0  . nitroGLYCERIN  (NITROSTAT) 0.4 MG SL tablet Place 1 tablet (0.4 mg total) under the tongue every 5 (five) minutes as needed for chest pain. 25 tablet 0  . potassium chloride SA (K-DUR,KLOR-CON) 20 MEQ tablet TAKE ONE (1) TABLET BY MOUTH EVERY DAY 90 tablet 3  . rOPINIRole (REQUIP) 0.5 MG tablet Take 0.5 mg by mouth at bedtime.   0  . carvedilol (COREG) 25 MG tablet Take 1 tablet (25 mg total) by mouth 2 (two) times daily. 180 tablet 3   No current facility-administered medications for this visit.     Allergies:   Penicillins and Percocet [oxycodone-acetaminophen]   Social History:  The patient  reports that she has never smoked. She has never used smokeless tobacco. She reports that she does not drink alcohol or use drugs.   Family History:  The patient's family history includes Diabetes in her brother; Heart disease in her brother, brother, father, mother, and sister; Lupus in her daughter; Thyroid disease in her brother and brother.    ROS:  Please see the history of present illness.   Otherwise, review of systems is positive for chest pain, leg swelling, shortness of breath, palpitations.   All other systems are reviewed and negative.   PHYSICAL EXAM: VS:  BP 130/78   Pulse 79   Ht 5\' 3"  (1.6 m)   Wt 157 lb 6.4 oz (71.4 kg)   SpO2 96%   BMI 27.88 kg/m  , BMI Body mass index is 27.88 kg/m. GEN: Well nourished, well developed, in no acute distress  HEENT: normal  Neck: no JVD, carotid bruits, or masses Cardiac: iRRR; no murmurs, rubs, or gallops,no edema  Respiratory:  clear to auscultation bilaterally, normal work of breathing GI: soft, nontender, nondistended, + BS MS: no deformity or atrophy  Skin: warm and dry, device site well healed Neuro:  Strength and sensation are intact Psych: euthymic mood, full affect  EKG:  EKG is ordered today. Personal review of the ekg ordered shows atrial flutter, rate 79  Personal review of the device interrogation today. Results in [place]: 11/25/2017: ALT 165; B Natriuretic Peptide 264.0; Hemoglobin 12.3; Platelets 305 12/06/2017: BUN 21; Creat 1.47; Potassium 4.1; Sodium 139; TSH 0.05    Lipid Panel     Component Value Date/Time   CHOL 166 08/05/2017 0807   TRIG 160 (H) 08/05/2017 0807   HDL 43 (L) 08/05/2017 0807   CHOLHDL 3.9  08/05/2017 0807   VLDL NOT CALC 04/14/2016 0910   LDLCALC 96 08/05/2017 0807     Wt Readings from Last 3 Encounters:  12/14/17 157 lb 6.4 oz (71.4 kg)  12/08/17 154 lb (69.9 kg)  12/02/17 157 lb 6.4 oz (71.4 kg)      Other studies Reviewed: Additional studies/ records that were reviewed today include: TTE 08/04/17, Cath  08/02/15 Review of the above records today demonstrates:  - Left ventricle: The cavity size was normal. Wall thickness was   increased in a pattern of moderate LVH. Systolic function was   normal. The estimated ejection fraction was in the range of 55%   to 60%. Wall motion was normal; there were no regional wall   motion abnormalities. Features are consistent with a pseudonormal   left ventricular filling pattern, with concomitant abnormal   relaxation and increased filling pressure (grade 2 diastolic   dysfunction). Doppler parameters are consistent with high   ventricular filling pressure. - Left atrium: The atrium was severely dilated. - Right ventricle: Pacer wire or catheter noted in right ventricle. - Right atrium: Pacer wire or catheter noted in right atrium.   1st RPLB lesion, 50% stenosed.  Dist RCA lesion, 30% stenosed.  Ost 1st Mrg to 1st Mrg lesion, 100% stenosed.  Ost LAD lesion, 40% stenosed.  Mid LAD lesion, 40% stenosed.  Post Atrio lesion, 90% stenosed. Post intervention, there is a 0% residual stenosis.  The left ventricular systolic function is normal.  Mid RCA lesion, 20% stenosed.   Normal LV function without focal segmental wall motion abnormalities and ejection fraction of 55%.  Significant coronary obstructive disease with  diffuse mild luminal narrowing of the LAD with 40% proximal and mid stenoses; widely patent stent in the proximal circumflex with old occlusion of the marginal branch which had arisen in the region of the stented segment but with evidence for very faint collateralization to this distal marginal vessel from the the LAD; a large dominant RCA with 20%, mild mid narrowing, 30% narrowing after the acute margin and focal 90% stenosis distally prior to a PDA 2 vessel with 50% narrowing at the ostium of this PDA prior to a bend in the vessel with the RCA ending in the PLA vessel.  Successful percutaneous cardiac intervention to the distal RCA treated with Angiosculpt scoring balloon, PTCA, and ultimate stenting with a 2.58 mm Xience Alpine DES stent postdilated to 2.51 mm with a 90% stenosis reduced to 0% and no change in the ostial PDA narrowing.  ASSESSMENT AND PLAN:  1.  Paroxysmal atrial fibrillation with tachybradycardia syndrome: That is post Medtronic dual-chamber pacemaker.  Device functioning appropriately.  She is unfortunately in atrial flutter today.  We will plan for cardioversion.  She is currently on amiodarone.  Would prefer to have her on a medication without such serious side effects.  We will plan to potentially switch her to Multaq at the next visit.  This patients CHA2DS2-VASc Score and unadjusted Ischemic Stroke Rate (% per year) is equal to 11.2 % stroke rate/year from a score of 7  Above score calculated as 1 point each if present [CHF, HTN, DM, Vascular=MI/PAD/Aortic Plaque, Age if 65-74, or Female] Above score calculated as 2 points each if present [Age > 75, or Stroke/TIA/TE]   2. Coronary artery disease: Minimal chest pain.  No changes.  3. Hypertension: Blood pressure is well-controlled today. Unfortunately sh blood pressure well controlled.  No changes.  Current medicines are reviewed at length with the  patient today.   The patient does not have concerns regarding her  medicines.  The following changes were made today: None  Labs/ tests ordered today include:  Orders Placed This Encounter  Procedures  . Basic Metabolic Panel (BMET)  . CBC  . EKG 12-Lead     Disposition:   FU with Will Camnitz 3 months  Signed, Will Meredith Leeds, MD  12/14/2017 12:48 PM     [address] [place] [address] [phone] (office) [phone] (fax)

## 2017-12-15 ENCOUNTER — Ambulatory Visit (HOSPITAL_COMMUNITY): Payer: PPO | Admitting: Certified Registered Nurse Anesthetist

## 2017-12-15 ENCOUNTER — Encounter (HOSPITAL_COMMUNITY): Payer: Self-pay | Admitting: *Deleted

## 2017-12-15 ENCOUNTER — Encounter (HOSPITAL_COMMUNITY)
Admission: RE | Disposition: A | Discharge status: Home / Self Care | Payer: Self-pay | Source: Ambulatory Visit | Attending: Cardiovascular Disease

## 2017-12-15 ENCOUNTER — Ambulatory Visit (HOSPITAL_BASED_OUTPATIENT_CLINIC_OR_DEPARTMENT_OTHER)
Admission: RE | Admit: 2017-12-15 | Discharge: 2017-12-15 | Disposition: A | Discharge status: Home / Self Care | Payer: PPO | Source: Ambulatory Visit | Attending: Cardiovascular Disease | Admitting: Cardiovascular Disease

## 2017-12-15 ENCOUNTER — Other Ambulatory Visit: Payer: Self-pay

## 2017-12-15 DIAGNOSIS — I10 Essential (primary) hypertension: Secondary | ICD-10-CM

## 2017-12-15 DIAGNOSIS — K219 Gastro-esophageal reflux disease without esophagitis: Secondary | ICD-10-CM

## 2017-12-15 DIAGNOSIS — I129 Hypertensive chronic kidney disease with stage 1 through stage 4 chronic kidney disease, or unspecified chronic kidney disease: Secondary | ICD-10-CM | POA: Diagnosis not present

## 2017-12-15 DIAGNOSIS — Z7901 Long term (current) use of anticoagulants: Secondary | ICD-10-CM | POA: Diagnosis not present

## 2017-12-15 DIAGNOSIS — Z79899 Other long term (current) drug therapy: Secondary | ICD-10-CM | POA: Diagnosis not present

## 2017-12-15 DIAGNOSIS — E7800 Pure hypercholesterolemia, unspecified: Secondary | ICD-10-CM

## 2017-12-15 DIAGNOSIS — M1990 Unspecified osteoarthritis, unspecified site: Secondary | ICD-10-CM | POA: Insufficient documentation

## 2017-12-15 DIAGNOSIS — I4892 Unspecified atrial flutter: Secondary | ICD-10-CM

## 2017-12-15 DIAGNOSIS — Z950 Presence of cardiac pacemaker: Secondary | ICD-10-CM | POA: Diagnosis not present

## 2017-12-15 DIAGNOSIS — D519 Vitamin B12 deficiency anemia, unspecified: Secondary | ICD-10-CM

## 2017-12-15 DIAGNOSIS — Z794 Long term (current) use of insulin: Secondary | ICD-10-CM | POA: Diagnosis not present

## 2017-12-15 DIAGNOSIS — Z6827 Body mass index (BMI) 27.0-27.9, adult: Secondary | ICD-10-CM | POA: Diagnosis not present

## 2017-12-15 DIAGNOSIS — I110 Hypertensive heart disease with heart failure: Secondary | ICD-10-CM | POA: Diagnosis not present

## 2017-12-15 DIAGNOSIS — G4733 Obstructive sleep apnea (adult) (pediatric): Secondary | ICD-10-CM | POA: Diagnosis present

## 2017-12-15 DIAGNOSIS — I5031 Acute diastolic (congestive) heart failure: Secondary | ICD-10-CM | POA: Diagnosis not present

## 2017-12-15 DIAGNOSIS — Z880 Allergy status to penicillin: Secondary | ICD-10-CM | POA: Diagnosis not present

## 2017-12-15 DIAGNOSIS — E038 Other specified hypothyroidism: Secondary | ICD-10-CM | POA: Diagnosis not present

## 2017-12-15 DIAGNOSIS — E1151 Type 2 diabetes mellitus with diabetic peripheral angiopathy without gangrene: Secondary | ICD-10-CM

## 2017-12-15 DIAGNOSIS — I483 Typical atrial flutter: Secondary | ICD-10-CM | POA: Diagnosis not present

## 2017-12-15 DIAGNOSIS — Z8673 Personal history of transient ischemic attack (TIA), and cerebral infarction without residual deficits: Secondary | ICD-10-CM

## 2017-12-15 DIAGNOSIS — E1159 Type 2 diabetes mellitus with other circulatory complications: Secondary | ICD-10-CM | POA: Diagnosis not present

## 2017-12-15 DIAGNOSIS — I5033 Acute on chronic diastolic (congestive) heart failure: Secondary | ICD-10-CM | POA: Diagnosis present

## 2017-12-15 DIAGNOSIS — E782 Mixed hyperlipidemia: Secondary | ICD-10-CM | POA: Diagnosis present

## 2017-12-15 DIAGNOSIS — Z955 Presence of coronary angioplasty implant and graft: Secondary | ICD-10-CM | POA: Diagnosis not present

## 2017-12-15 DIAGNOSIS — I480 Paroxysmal atrial fibrillation: Secondary | ICD-10-CM | POA: Diagnosis present

## 2017-12-15 DIAGNOSIS — I495 Sick sinus syndrome: Secondary | ICD-10-CM | POA: Diagnosis present

## 2017-12-15 DIAGNOSIS — I2510 Atherosclerotic heart disease of native coronary artery without angina pectoris: Secondary | ICD-10-CM

## 2017-12-15 DIAGNOSIS — I25110 Atherosclerotic heart disease of native coronary artery with unstable angina pectoris: Secondary | ICD-10-CM | POA: Diagnosis not present

## 2017-12-15 DIAGNOSIS — R0602 Shortness of breath: Secondary | ICD-10-CM | POA: Diagnosis not present

## 2017-12-15 DIAGNOSIS — N183 Chronic kidney disease, stage 3 (moderate): Secondary | ICD-10-CM | POA: Diagnosis present

## 2017-12-15 DIAGNOSIS — E039 Hypothyroidism, unspecified: Secondary | ICD-10-CM | POA: Diagnosis present

## 2017-12-15 DIAGNOSIS — I252 Old myocardial infarction: Secondary | ICD-10-CM

## 2017-12-15 DIAGNOSIS — E663 Overweight: Secondary | ICD-10-CM | POA: Diagnosis present

## 2017-12-15 DIAGNOSIS — I130 Hypertensive heart and chronic kidney disease with heart failure and stage 1 through stage 4 chronic kidney disease, or unspecified chronic kidney disease: Secondary | ICD-10-CM | POA: Diagnosis present

## 2017-12-15 DIAGNOSIS — I25118 Atherosclerotic heart disease of native coronary artery with other forms of angina pectoris: Secondary | ICD-10-CM | POA: Diagnosis not present

## 2017-12-15 DIAGNOSIS — L932 Other local lupus erythematosus: Secondary | ICD-10-CM | POA: Insufficient documentation

## 2017-12-15 DIAGNOSIS — Z7902 Long term (current) use of antithrombotics/antiplatelets: Secondary | ICD-10-CM | POA: Diagnosis not present

## 2017-12-15 DIAGNOSIS — R0902 Hypoxemia: Secondary | ICD-10-CM | POA: Diagnosis present

## 2017-12-15 DIAGNOSIS — E1122 Type 2 diabetes mellitus with diabetic chronic kidney disease: Secondary | ICD-10-CM | POA: Diagnosis present

## 2017-12-15 HISTORY — PX: CARDIOVERSION: SHX1299

## 2017-12-15 LAB — CBC
HEMATOCRIT: 36.6 % (ref 34.0–46.6)
HEMOGLOBIN: 11.8 g/dL (ref 11.1–15.9)
MCH: 29.2 pg (ref 26.6–33.0)
MCHC: 32.2 g/dL (ref 31.5–35.7)
MCV: 91 fL (ref 79–97)
Platelets: 259 10*3/uL (ref 150–450)
RBC: 4.04 x10E6/uL (ref 3.77–5.28)
RDW: 16 % — AB (ref 12.3–15.4)
WBC: 9.1 10*3/uL (ref 3.4–10.8)

## 2017-12-15 LAB — BASIC METABOLIC PANEL
BUN / CREAT RATIO: 12 (ref 12–28)
BUN: 14 mg/dL (ref 8–27)
CALCIUM: 9.2 mg/dL (ref 8.7–10.3)
CHLORIDE: 104 mmol/L (ref 96–106)
CO2: 23 mmol/L (ref 20–29)
CREATININE: 1.15 mg/dL — AB (ref 0.57–1.00)
GFR calc Af Amer: 55 mL/min/{1.73_m2} — ABNORMAL LOW (ref >59)
GFR calc non Af Amer: 48 mL/min/{1.73_m2} — ABNORMAL LOW (ref >59)
Glucose: 109 mg/dL — ABNORMAL HIGH (ref 65–99)
Potassium: 4.3 mmol/L (ref 3.5–5.2)
Sodium: 143 mmol/L (ref 134–144)

## 2017-12-15 LAB — GLUCOSE, CAPILLARY: Glucose-Capillary: 122 mg/dL — ABNORMAL HIGH (ref 70–99)

## 2017-12-15 SURGERY — CARDIOVERSION
Anesthesia: General

## 2017-12-15 MED ORDER — PROPOFOL 10 MG/ML IV BOLUS
INTRAVENOUS | Status: DC | PRN
  Administered 2017-12-15: 45 mg via INTRAVENOUS

## 2017-12-15 MED ORDER — SODIUM CHLORIDE 0.9 % IV SOLN
INTRAVENOUS | Status: DC | PRN
  Administered 2017-12-15: 09:00:00 via INTRAVENOUS

## 2017-12-15 MED ORDER — LIDOCAINE 2% (20 MG/ML) 5 ML SYRINGE
INTRAMUSCULAR | Status: DC | PRN
  Administered 2017-12-15: 100 mg via INTRAVENOUS

## 2017-12-15 NOTE — Transfer of Care (Signed)
Immediate Anesthesia Transfer of Care Note  Patient: Virginia Huber  Procedure(s) Performed: CARDIOVERSION (N/A )  Patient Location: Endoscopy Unit  Anesthesia Type:General  Level of Consciousness: drowsy and patient cooperative  Airway & Oxygen Therapy: Patient Spontanous Breathing  Post-op Assessment: Report given to RN, Post -op Vital signs reviewed and stable and Patient moving all extremities X 4  Post vital signs: Reviewed and stable  Last Vitals:  Vitals Value Taken Time  BP    Temp    Pulse    Resp    SpO2      Last Pain:  Vitals:   12/15/17 0826  TempSrc: Oral  PainSc: 0-No pain         Complications: No apparent anesthesia complications

## 2017-12-15 NOTE — Anesthesia Postprocedure Evaluation (Signed)
Anesthesia Post Note  Patient: Virginia Huber  Procedure(s) Performed: CARDIOVERSION (N/A )     Patient location during evaluation: Endoscopy Anesthesia Type: General Level of consciousness: awake and alert Pain management: pain level controlled Vital Signs Assessment: post-procedure vital signs reviewed and stable Respiratory status: spontaneous breathing, nonlabored ventilation, respiratory function stable and patient connected to nasal cannula oxygen Cardiovascular status: blood pressure returned to baseline and stable Postop Assessment: no apparent nausea or vomiting Anesthetic complications: no    Last Vitals:  Vitals:   12/15/17 0915 12/15/17 0922  BP: (!) 142/57 (!) 157/58  Pulse: 66 67  Resp: (!) 22 (!) 27  Temp:    SpO2: 94% 94%    Last Pain:  Vitals:   12/15/17 0922  TempSrc:   PainSc: 0-No pain                 Montez Hageman

## 2017-12-15 NOTE — CV Procedure (Signed)
    Cardioversion Note  Virginia Huber [id number] [birth year]  Procedure: DC Cardioversion Indications: Atrial flutter   Procedure Details Consent: Obtained Time Out: Verified patient identification, verified procedure, site/side was marked, verified correct patient position, special equipment/implants available, Radiology Safety Procedures followed,  medications/allergies/relevent history reviewed, required imaging and test results available.  Performed  The patient has been on adequate anticoagulation.  The patient received IV Lidocaine 100 mg followed by Propofol 45 mg IV  for sedation.  Synchronous cardioversion was performed at 120  joules.  The cardioversion was successful Rhythm was verified before and after cardioversion by Medtronic Rep , Dannial Monarch.  Pacer settings unchanged    Complications: No apparent complications Patient did tolerate procedure well.   Thayer Headings, Brooke Bonito., MD, [facility] 12/15/2017, 9:02 AM

## 2017-12-15 NOTE — Interval H&P Note (Signed)
History and Physical Interval Note:  12/15/2017 8:36 AM  Virginia Huber  has presented today for surgery, with the diagnosis of AFIB  The various methods of treatment have been discussed with the patient and family. After consideration of risks, benefits and other options for treatment, the patient has consented to  Procedure(s): CARDIOVERSION (N/A) as a surgical intervention .  The patient's history has been reviewed, patient examined, no change in status, stable for surgery.  I have reviewed the patient's chart and labs.  Questions were answered to the patient's satisfaction.     Mertie Moores

## 2017-12-15 NOTE — Discharge Instructions (Signed)
Electrical Cardioversion, Care After °This sheet gives you information about how to care for yourself after your procedure. Your health care provider may also give you more specific instructions. If you have problems or questions, contact your health care provider. °What can I expect after the procedure? °After the procedure, it is common to have: °· Some redness on the skin where the shocks were given. ° °Follow these instructions at home: °· Do not drive for 24 hours if you were given a medicine to help you relax (sedative). °· Take over-the-counter and prescription medicines only as told by your health care provider. °· Ask your health care provider how to check your pulse. Check it often. °· Rest for 48 hours after the procedure or as told by your health care provider. °· Avoid or limit your caffeine use as told by your health care provider. °Contact a health care provider if: °· You feel like your heart is beating too quickly or your pulse is not regular. °· You have a serious muscle cramp that does not go away. °Get help right away if: °· You have discomfort in your chest. °· You are dizzy or you feel faint. °· You have trouble breathing or you are short of breath. °· Your speech is slurred. °· You have trouble moving an arm or leg on one side of your body. °· Your fingers or toes turn cold or blue. °This information is not intended to replace advice given to you by your health care provider. Make sure you discuss any questions you have with your health care provider. °Document Released: 03/29/2013 Document Revised: 01/10/2016 Document Reviewed: 12/13/2015 °Elsevier Interactive Patient Education © 2018 [employer] ° °

## 2017-12-15 NOTE — Anesthesia Preprocedure Evaluation (Signed)
Anesthesia Evaluation  Patient identified by MRN, date of birth, ID band Patient awake    Reviewed: Allergy & Precautions, NPO status , Patient's Chart, lab work & pertinent test results  Airway Mallampati: II  TM Distance: >3 FB Neck ROM: Full    Dental no notable dental hx.    Pulmonary neg pulmonary ROS,    Pulmonary exam normal breath sounds clear to auscultation       Cardiovascular hypertension, Pt. on medications + CAD, + Past MI and + Cardiac Stents  Normal cardiovascular exam+ dysrhythmias Atrial Fibrillation + pacemaker  Rhythm:Regular Rate:Normal     Neuro/Psych TIAnegative psych ROS   GI/Hepatic negative GI ROS, Neg liver ROS,   Endo/Other  negative endocrine ROSdiabetes  Renal/GU negative Renal ROS  negative genitourinary   Musculoskeletal negative musculoskeletal ROS (+)   Abdominal   Peds negative pediatric ROS (+)  Hematology negative hematology ROS (+)   Anesthesia Other Findings   Reproductive/Obstetrics negative OB ROS                             Anesthesia Physical Anesthesia Plan  ASA: III  Anesthesia Plan: General   Post-op Pain Management:    Induction: Intravenous  PONV Risk Score and Plan: 3 and Treatment may vary due to age or medical condition  Airway Management Planned: Mask  Additional Equipment:   Intra-op Plan:   Post-operative Plan: Extubation in OR  Informed Consent: I have reviewed the patients History and Physical, chart, labs and discussed the procedure including the risks, benefits and alternatives for the proposed anesthesia with the patient or authorized representative who has indicated his/her understanding and acceptance.   Dental advisory given  Plan Discussed with: CRNA  Anesthesia Plan Comments:         Anesthesia Quick Evaluation

## 2017-12-17 ENCOUNTER — Emergency Department (HOSPITAL_COMMUNITY): Payer: PPO

## 2017-12-17 ENCOUNTER — Inpatient Hospital Stay (HOSPITAL_COMMUNITY)
Admission: EM | Admit: 2017-12-17 | Discharge: 2017-12-21 | DRG: 291 | Disposition: A | Discharge status: Home / Self Care | Payer: PPO | Attending: Internal Medicine | Admitting: Internal Medicine

## 2017-12-17 ENCOUNTER — Encounter (HOSPITAL_COMMUNITY): Payer: Self-pay | Admitting: Emergency Medicine

## 2017-12-17 ENCOUNTER — Other Ambulatory Visit: Payer: Self-pay

## 2017-12-17 DIAGNOSIS — Z955 Presence of coronary angioplasty implant and graft: Secondary | ICD-10-CM | POA: Diagnosis not present

## 2017-12-17 DIAGNOSIS — Z7901 Long term (current) use of anticoagulants: Secondary | ICD-10-CM

## 2017-12-17 DIAGNOSIS — Z79899 Other long term (current) drug therapy: Secondary | ICD-10-CM

## 2017-12-17 DIAGNOSIS — I495 Sick sinus syndrome: Secondary | ICD-10-CM | POA: Diagnosis present

## 2017-12-17 DIAGNOSIS — R0902 Hypoxemia: Secondary | ICD-10-CM | POA: Diagnosis present

## 2017-12-17 DIAGNOSIS — I130 Hypertensive heart and chronic kidney disease with heart failure and stage 1 through stage 4 chronic kidney disease, or unspecified chronic kidney disease: Secondary | ICD-10-CM | POA: Diagnosis present

## 2017-12-17 DIAGNOSIS — I480 Paroxysmal atrial fibrillation: Secondary | ICD-10-CM | POA: Diagnosis present

## 2017-12-17 DIAGNOSIS — Z6827 Body mass index (BMI) 27.0-27.9, adult: Secondary | ICD-10-CM | POA: Diagnosis not present

## 2017-12-17 DIAGNOSIS — I252 Old myocardial infarction: Secondary | ICD-10-CM

## 2017-12-17 DIAGNOSIS — I10 Essential (primary) hypertension: Secondary | ICD-10-CM | POA: Diagnosis present

## 2017-12-17 DIAGNOSIS — Z880 Allergy status to penicillin: Secondary | ICD-10-CM

## 2017-12-17 DIAGNOSIS — I2510 Atherosclerotic heart disease of native coronary artery without angina pectoris: Secondary | ICD-10-CM | POA: Diagnosis present

## 2017-12-17 DIAGNOSIS — E039 Hypothyroidism, unspecified: Secondary | ICD-10-CM | POA: Diagnosis present

## 2017-12-17 DIAGNOSIS — K219 Gastro-esophageal reflux disease without esophagitis: Secondary | ICD-10-CM | POA: Diagnosis present

## 2017-12-17 DIAGNOSIS — E782 Mixed hyperlipidemia: Secondary | ICD-10-CM | POA: Diagnosis present

## 2017-12-17 DIAGNOSIS — I5033 Acute on chronic diastolic (congestive) heart failure: Secondary | ICD-10-CM | POA: Diagnosis present

## 2017-12-17 DIAGNOSIS — Z794 Long term (current) use of insulin: Secondary | ICD-10-CM | POA: Diagnosis not present

## 2017-12-17 DIAGNOSIS — N183 Chronic kidney disease, stage 3 (moderate): Secondary | ICD-10-CM | POA: Diagnosis present

## 2017-12-17 DIAGNOSIS — E1151 Type 2 diabetes mellitus with diabetic peripheral angiopathy without gangrene: Secondary | ICD-10-CM | POA: Diagnosis present

## 2017-12-17 DIAGNOSIS — Z950 Presence of cardiac pacemaker: Secondary | ICD-10-CM

## 2017-12-17 DIAGNOSIS — G4733 Obstructive sleep apnea (adult) (pediatric): Secondary | ICD-10-CM | POA: Diagnosis present

## 2017-12-17 DIAGNOSIS — Z7902 Long term (current) use of antithrombotics/antiplatelets: Secondary | ICD-10-CM

## 2017-12-17 DIAGNOSIS — I4892 Unspecified atrial flutter: Secondary | ICD-10-CM | POA: Diagnosis present

## 2017-12-17 DIAGNOSIS — E1159 Type 2 diabetes mellitus with other circulatory complications: Secondary | ICD-10-CM | POA: Diagnosis present

## 2017-12-17 DIAGNOSIS — E1122 Type 2 diabetes mellitus with diabetic chronic kidney disease: Secondary | ICD-10-CM | POA: Diagnosis present

## 2017-12-17 DIAGNOSIS — E1121 Type 2 diabetes mellitus with diabetic nephropathy: Secondary | ICD-10-CM | POA: Diagnosis present

## 2017-12-17 DIAGNOSIS — R0602 Shortness of breath: Secondary | ICD-10-CM

## 2017-12-17 DIAGNOSIS — I25118 Atherosclerotic heart disease of native coronary artery with other forms of angina pectoris: Secondary | ICD-10-CM | POA: Diagnosis not present

## 2017-12-17 DIAGNOSIS — I5031 Acute diastolic (congestive) heart failure: Secondary | ICD-10-CM

## 2017-12-17 DIAGNOSIS — N1832 Chronic kidney disease, stage 3b: Secondary | ICD-10-CM | POA: Diagnosis present

## 2017-12-17 DIAGNOSIS — E038 Other specified hypothyroidism: Secondary | ICD-10-CM | POA: Diagnosis not present

## 2017-12-17 DIAGNOSIS — Z8673 Personal history of transient ischemic attack (TIA), and cerebral infarction without residual deficits: Secondary | ICD-10-CM | POA: Diagnosis not present

## 2017-12-17 DIAGNOSIS — E663 Overweight: Secondary | ICD-10-CM | POA: Diagnosis present

## 2017-12-17 LAB — BASIC METABOLIC PANEL
Anion gap: 9 (ref 5–15)
BUN: 14 mg/dL (ref 8–23)
CHLORIDE: 104 mmol/L (ref 98–111)
CO2: 25 mmol/L (ref 22–32)
Calcium: 8.5 mg/dL — ABNORMAL LOW (ref 8.9–10.3)
Creatinine, Ser: 1.06 mg/dL — ABNORMAL HIGH (ref 0.44–1.00)
GFR calc non Af Amer: 51 mL/min — ABNORMAL LOW (ref >60)
GFR, EST AFRICAN AMERICAN: 59 mL/min — AB (ref >60)
GLUCOSE: 143 mg/dL — AB (ref 70–99)
Potassium: 3.5 mmol/L (ref 3.5–5.1)
Sodium: 138 mmol/L (ref 135–145)

## 2017-12-17 LAB — HEPATIC FUNCTION PANEL
ALK PHOS: 65 U/L (ref 38–126)
ALT: 107 U/L — ABNORMAL HIGH (ref 0–44)
AST: 58 U/L — ABNORMAL HIGH (ref 15–41)
Albumin: 3.4 g/dL — ABNORMAL LOW (ref 3.5–5.0)
BILIRUBIN DIRECT: 0.3 mg/dL — AB (ref 0.0–0.2)
BILIRUBIN INDIRECT: 0.9 mg/dL (ref 0.3–0.9)
BILIRUBIN TOTAL: 1.2 mg/dL (ref 0.3–1.2)
Total Protein: 6.3 g/dL — ABNORMAL LOW (ref 6.5–8.1)

## 2017-12-17 LAB — BRAIN NATRIURETIC PEPTIDE: B Natriuretic Peptide: 677 pg/mL — ABNORMAL HIGH (ref 0.0–100.0)

## 2017-12-17 LAB — HEMOGLOBIN A1C
HEMOGLOBIN A1C: 6.8 % — AB (ref 4.8–5.6)
MEAN PLASMA GLUCOSE: 148.46 mg/dL

## 2017-12-17 LAB — CBC WITH DIFFERENTIAL/PLATELET
Basophils Absolute: 0 10*3/uL (ref 0.0–0.1)
Basophils Relative: 0 %
Eosinophils Absolute: 0.1 10*3/uL (ref 0.0–0.7)
Eosinophils Relative: 1 %
HEMATOCRIT: 35.5 % — AB (ref 36.0–46.0)
HEMOGLOBIN: 10.9 g/dL — AB (ref 12.0–15.0)
LYMPHS ABS: 0.6 10*3/uL — AB (ref 0.7–4.0)
Lymphocytes Relative: 5 %
MCH: 28.2 pg (ref 26.0–34.0)
MCHC: 30.7 g/dL (ref 30.0–36.0)
MCV: 92 fL (ref 78.0–100.0)
MONO ABS: 1.2 10*3/uL — AB (ref 0.1–1.0)
MONOS PCT: 10 %
NEUTROS ABS: 10.3 10*3/uL — AB (ref 1.7–7.7)
NEUTROS PCT: 84 %
Platelets: 234 10*3/uL (ref 150–400)
RBC: 3.86 MIL/uL — ABNORMAL LOW (ref 3.87–5.11)
RDW: 15.3 % (ref 11.5–15.5)
WBC: 12.1 10*3/uL — ABNORMAL HIGH (ref 4.0–10.5)

## 2017-12-17 LAB — GLUCOSE, CAPILLARY
Glucose-Capillary: 114 mg/dL — ABNORMAL HIGH (ref 70–99)
Glucose-Capillary: 244 mg/dL — ABNORMAL HIGH (ref 70–99)

## 2017-12-17 LAB — TROPONIN I: Troponin I: <0.03 ng/mL (ref <0.03)

## 2017-12-17 LAB — TSH: TSH: 0.059 u[IU]/mL — ABNORMAL LOW (ref 0.350–4.500)

## 2017-12-17 MED ORDER — ACETAMINOPHEN 325 MG PO TABS
650.0000 mg | ORAL_TABLET | ORAL | Status: DC | PRN
  Administered 2017-12-18: 650 mg via ORAL
  Filled 2017-12-17: qty 2

## 2017-12-17 MED ORDER — ROPINIROLE HCL 0.25 MG PO TABS
0.5000 mg | ORAL_TABLET | Freq: Every day | ORAL | Status: DC
  Administered 2017-12-17 – 2017-12-20 (×4): 0.5 mg via ORAL
  Filled 2017-12-17 (×4): qty 2

## 2017-12-17 MED ORDER — APIXABAN 5 MG PO TABS
5.0000 mg | ORAL_TABLET | Freq: Two times a day (BID) | ORAL | Status: DC
  Administered 2017-12-17 – 2017-12-21 (×8): 5 mg via ORAL
  Filled 2017-12-17 (×8): qty 1

## 2017-12-17 MED ORDER — FUROSEMIDE 10 MG/ML IJ SOLN
40.0000 mg | Freq: Once | INTRAMUSCULAR | Status: AC
  Administered 2017-12-17: 40 mg via INTRAVENOUS
  Filled 2017-12-17: qty 4

## 2017-12-17 MED ORDER — SODIUM CHLORIDE 0.9 % IV SOLN: 250.0000 mL | INTRAVENOUS | Status: DC | PRN

## 2017-12-17 MED ORDER — LEVOTHYROXINE SODIUM 75 MCG PO TABS
175.0000 ug | ORAL_TABLET | Freq: Every day | ORAL | Status: DC
  Administered 2017-12-18: 175 ug via ORAL
  Filled 2017-12-17: qty 1

## 2017-12-17 MED ORDER — FUROSEMIDE 10 MG/ML IJ SOLN
40.0000 mg | Freq: Two times a day (BID) | INTRAMUSCULAR | Status: DC
  Administered 2017-12-17 – 2017-12-21 (×8): 40 mg via INTRAVENOUS
  Filled 2017-12-17 (×8): qty 4

## 2017-12-17 MED ORDER — AMIODARONE HCL 200 MG PO TABS
200.0000 mg | ORAL_TABLET | Freq: Every day | ORAL | Status: DC
  Administered 2017-12-18 – 2017-12-21 (×4): 200 mg via ORAL
  Filled 2017-12-17 (×4): qty 1

## 2017-12-17 MED ORDER — ACETAMINOPHEN 325 MG PO TABS: 650.0000 mg | ORAL_TABLET | Freq: Four times a day (QID) | ORAL | Status: DC | PRN

## 2017-12-17 MED ORDER — INSULIN DETEMIR 100 UNIT/ML ~~LOC~~ SOLN
7.0000 [IU] | Freq: Every day | SUBCUTANEOUS | Status: DC
  Administered 2017-12-17 – 2017-12-20 (×4): 7 [IU] via SUBCUTANEOUS
  Filled 2017-12-17 (×5): qty 0.07

## 2017-12-17 MED ORDER — ONDANSETRON HCL 4 MG/2ML IJ SOLN: 4.0000 mg | Freq: Four times a day (QID) | INTRAMUSCULAR | Status: DC | PRN

## 2017-12-17 MED ORDER — SODIUM CHLORIDE 0.9% FLUSH
3.0000 mL | Freq: Two times a day (BID) | INTRAVENOUS | Status: DC
  Administered 2017-12-17 – 2017-12-20 (×7): 3 mL via INTRAVENOUS

## 2017-12-17 MED ORDER — ATORVASTATIN CALCIUM 40 MG PO TABS
80.0000 mg | ORAL_TABLET | Freq: Every day | ORAL | Status: DC
  Administered 2017-12-17 – 2017-12-20 (×4): 80 mg via ORAL
  Filled 2017-12-17 (×4): qty 2

## 2017-12-17 MED ORDER — CLOPIDOGREL BISULFATE 75 MG PO TABS
75.0000 mg | ORAL_TABLET | Freq: Every day | ORAL | Status: DC
  Administered 2017-12-18 – 2017-12-21 (×4): 75 mg via ORAL
  Filled 2017-12-17 (×4): qty 1

## 2017-12-17 MED ORDER — DILTIAZEM HCL ER COATED BEADS 180 MG PO CP24
360.0000 mg | ORAL_CAPSULE | Freq: Every day | ORAL | Status: DC
  Administered 2017-12-18 – 2017-12-21 (×4): 360 mg via ORAL
  Filled 2017-12-17 (×4): qty 2

## 2017-12-17 MED ORDER — PANTOPRAZOLE SODIUM 40 MG PO TBEC
40.0000 mg | DELAYED_RELEASE_TABLET | Freq: Every day | ORAL | Status: DC
  Administered 2017-12-17 – 2017-12-21 (×5): 40 mg via ORAL
  Filled 2017-12-17 (×5): qty 1

## 2017-12-17 MED ORDER — NITROGLYCERIN 2 % TD OINT
1.0000 [in_us] | TOPICAL_OINTMENT | Freq: Once | TRANSDERMAL | Status: AC
  Administered 2017-12-17: 1 [in_us] via TOPICAL
  Filled 2017-12-17: qty 1

## 2017-12-17 MED ORDER — INSULIN ASPART 100 UNIT/ML ~~LOC~~ SOLN
0.0000 [IU] | Freq: Three times a day (TID) | SUBCUTANEOUS | Status: DC
  Administered 2017-12-18: 1 [IU] via SUBCUTANEOUS
  Administered 2017-12-18: 3 [IU] via SUBCUTANEOUS
  Administered 2017-12-18: 2 [IU] via SUBCUTANEOUS
  Administered 2017-12-19: 7 [IU] via SUBCUTANEOUS
  Administered 2017-12-19: 3 [IU] via SUBCUTANEOUS
  Administered 2017-12-19 – 2017-12-20 (×2): 2 [IU] via SUBCUTANEOUS
  Administered 2017-12-20 – 2017-12-21 (×3): 5 [IU] via SUBCUTANEOUS
  Administered 2017-12-21: 3 [IU] via SUBCUTANEOUS

## 2017-12-17 MED ORDER — VITAMIN D 1000 UNITS PO TABS
2000.0000 [IU] | ORAL_TABLET | Freq: Every day | ORAL | Status: DC
  Administered 2017-12-17 – 2017-12-21 (×5): 2000 [IU] via ORAL
  Filled 2017-12-17 (×5): qty 2

## 2017-12-17 MED ORDER — CARVEDILOL 12.5 MG PO TABS
25.0000 mg | ORAL_TABLET | Freq: Two times a day (BID) | ORAL | Status: DC
  Administered 2017-12-17 – 2017-12-21 (×8): 25 mg via ORAL
  Filled 2017-12-17 (×8): qty 2

## 2017-12-17 MED ORDER — TRAZODONE HCL 50 MG PO TABS
50.0000 mg | ORAL_TABLET | Freq: Every evening | ORAL | Status: DC | PRN
  Administered 2017-12-17 – 2017-12-20 (×4): 50 mg via ORAL
  Filled 2017-12-17 (×4): qty 1

## 2017-12-17 MED ORDER — POTASSIUM CHLORIDE CRYS ER 20 MEQ PO TBCR
20.0000 meq | EXTENDED_RELEASE_TABLET | Freq: Every day | ORAL | Status: DC
  Administered 2017-12-17 – 2017-12-19 (×3): 20 meq via ORAL
  Filled 2017-12-17 (×3): qty 1

## 2017-12-17 MED ORDER — SODIUM CHLORIDE 0.9% FLUSH: 3.0000 mL | INTRAVENOUS | Status: DC | PRN

## 2017-12-17 MED ORDER — LOSARTAN POTASSIUM 50 MG PO TABS
50.0000 mg | ORAL_TABLET | Freq: Every day | ORAL | Status: DC
  Administered 2017-12-18 – 2017-12-21 (×4): 50 mg via ORAL
  Filled 2017-12-17 (×4): qty 1

## 2017-12-17 NOTE — ED Notes (Signed)
Patient returned from Radiology. 

## 2017-12-17 NOTE — ED Notes (Signed)
Report given to 300 RN. 

## 2017-12-17 NOTE — ED Notes (Signed)
Patient transported to radiology

## 2017-12-17 NOTE — ED Notes (Signed)
EDP at bedside  

## 2017-12-17 NOTE — ED Provider Notes (Signed)
[facility] EMERGENCY DEPARTMENT Provider Note   CSN: [id number] Arrival date & time: 12/17/17  1011     History   Chief Complaint Chief Complaint  Patient presents with  . Shortness of Breath    HPI Virginia Huber is a 72 y.o. female.  Patient with history of atrial fibrillation, recent cardioversion on Wednesday, high blood pressure, heart failure, heart block with pacemaker presents with worsening shortness of breath and feeling bloated since Wednesday evening.  Worse with lying flat.  Patient does not normally use oxygen at home.  Symptoms worse with exertion.  No fevers, mild cough.     Past Medical History:  Diagnosis Date  . Arthritis   . Atrial fibrillation and flutter ([place])    a. h/o PAF/flutter during admission in 2013 for PNA. b. PAF during adm for NSTEMI 07/2015, subsequent paroxysms since then.  . B12 deficiency anemia   . Cardiac tamponade 06/2016  . Coronary artery disease 11/30/2014   a. remote MI. b. h/o PTCA with scoring balloon to OM1 11/2014. c. NSTEMI 03/2015 s/p DES to prox-mid Cx. d. NSTEMI 07/2015 s/p scoring balloon/PTCA/DES to dRCA with PAF during that admission  . Cutaneous lupus erythematosus   . GERD (gastroesophageal reflux disease)   . Heart block   . History of blood transfusion 1980's   2nd surgical procedures  . HTN (hypertension)   . Hypercholesteremia   . Hypothyroidism   . Myocardial infarction ([place]) 02/2012  . Ovarian tumor   . PAD (peripheral artery disease) ([place])    a. s/p LE angio 2015; followed by Dr. Fletcher Anon - managed medically.  . Pain with urination 05/08/2015  . Paroxysmal atrial flutter ([place])   . Pericardial effusion    a. 06/2016 after ppm - s/p pericardiocentesis.  . Superficial fungus infection of skin 06/29/2013  . Tachy-brady syndrome ([place])    a. s/p Medtronic PPM 06/2016, c/b lead perf/pericardial effusion.  [name] TIA (transient ischemic attack) 08/2001; ~ 2006  . Type II diabetes mellitus ([place])   . UTI (urinary tract  infection) 05/08/2013    Patient Active Problem List   Diagnosis Date Noted  . Typical atrial flutter ([place])   . 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 Paroxysmal atrial fibrillation ([place]) 12/08/2017  . 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 CKD (chronic kidney disease) stage 3, GFR 30-59 ml/min (HCC) 09/23/2017  . Fever 09/23/2017  . S/P pericardiocentesis 06/28/2016  . Acute blood loss anemia 06/28/2016  . Pericardial effusion 06/26/2016  . Tachy-brady syndrome ([place]) 06/25/2016  . Tamponade   . Bradycardia 06/14/2016  . Junctional bradycardia   . Coronary artery disease involving coronary bypass graft of native heart with unstable angina pectoris ([place])   . Acute diastolic CHF (congestive heart failure), NYHA class 3 ([place])   . Systolic congestive heart failure ([place]) 05/13/2016  . Multiple and bilateral precerebral artery syndromes 05/13/2016  . OSA (obstructive sleep apnea) 05/13/2016  . Chronic diarrhea 12/25/2015  . Chest pain 08/02/2015  . Atrial fibrillation with rapid ventricular response ([place])   . Pain with urination 05/08/2015  . NSTEMI (non-ST elevated myocardial infarction) ([place]) 04/02/2015  . CAD in native artery 11/30/2014  . 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   . PAD (peripheral artery disease) ([place]) 12/26/2013  . Superficial fungus infection of skin 06/29/2013  . UTI (urinary tract infection) 05/08/2013  . Hypokalemia 03/05/2012  . B12 deficiency anemia 03/02/2012  . Bronchospasm  03/02/2012  . Community acquired bacterial pneumonia 03/01/2012  . Acute respiratory failure with hypoxia ([place]) 03/01/2012  . DM type 2 causing vascular disease ([place]) 02/29/2012  . Hypothyroidism 02/28/2012  . RLQ abdominal pain 11/24/2010  . OVERWEIGHT/OBESITY 06/03/2010  . Essential hypertension 06/03/2010  . Mixed hyperlipidemia 12/27/2009  . Palpitations 05/17/2009  . FRACTURE,  TOE 12/06/2007    Past Surgical History:  Procedure Laterality Date  . ABDOMINAL AORTAGRAM N/A 01/03/2014   Procedure: ABDOMINAL [name];  Surgeon: Wellington Hampshire, MD;  Location: [place] CATH LAB;  Service: Cardiovascular;  Laterality: N/A;  . ABDOMINAL HYSTERECTOMY  1972   "partial"  . APPENDECTOMY  1970's  . CARDIAC CATHETERIZATION  2008   Tiny OM-2 with 90% narrowing. Med tx.  [name] CARDIAC CATHETERIZATION N/A 11/30/2014   Procedure: Left Heart Cath and Coronary Angiography;  Surgeon: Troy Sine, MD; LAD 20%, CFX 50%, OM1 95%, right PLB 30%, LV normal   . CARDIAC CATHETERIZATION N/A 11/30/2014   Procedure: Coronary Balloon Angioplasty;  Surgeon: Troy Sine, MD;  Angiosculpt scoring balloon and PTCA to the OM1 reducing stenosis from 95% to less than 10%  . CARDIAC CATHETERIZATION N/A 04/03/2015   Procedure: Left Heart Cath and Coronary Angiography;  Surgeon: Jolaine Artist, MD; dLAD 50%, CFX 90%, OM1 100%, PLA 15%, LVEDP 13    . CARDIAC CATHETERIZATION N/A 04/03/2015   Procedure: Coronary Stent Intervention;  Surgeon: Sherren Mocha, MD; 3.0x18 mm Xience DES to the CFX    . CARDIAC CATHETERIZATION N/A 08/02/2015   Procedure: Left Heart Cath and Coronary Angiography;  Surgeon: Troy Sine, MD;  Location: [place] CV LAB;  Service: Cardiovascular;  Laterality: N/A;  . CARDIAC CATHETERIZATION N/A 08/02/2015   Procedure: Coronary Stent Intervention;  Surgeon: Troy Sine, MD;  Location: [place] CV LAB;  Service: Cardiovascular;  Laterality: N/A;  . CARDIAC CATHETERIZATION N/A 06/25/2016   Procedure: Pericardiocentesis;  Surgeon: Will Meredith Leeds, MD;  Location: [place] CV LAB;  Service: Cardiovascular;  Laterality: N/A;  . cardiac stents    . CARDIOVERSION N/A 12/15/2017   Procedure: CARDIOVERSION;  Surgeon: Acie Fredrickson Wonda Cheng, MD;  Location: [facility] ENDOSCOPY;  Service: Cardiovascular;  Laterality: N/A;  . CHOLECYSTECTOMY OPEN  1990's  . COLONOSCOPY  2005   Dr. Laural Golden:  pancolonic divericula, polyp, path unknown currently  . COLONOSCOPY  2012   Dr. Oneida Alar: Normal TI, scattered diverticula in entire colon, small internal hemorrhoids, normal colon biopsies. Colonoscopy in 5-10 years.   . COLOSTOMY  05/1979  . COLOSTOMY REVERSAL  11/1979  . EP IMPLANTABLE DEVICE N/A 06/25/2016   Procedure: Lead Revision/Repair;  Surgeon: Will Meredith Leeds, MD;  Location: [place] CV LAB;  Service: Cardiovascular;  Laterality: N/A;  . EP IMPLANTABLE DEVICE N/A 06/25/2016   Procedure: Pacemaker Implant;  Surgeon: Will Meredith Leeds, MD;  Location: [place] CV LAB;  Service: Cardiovascular;  Laterality: N/A;  . EXCISIONAL HEMORRHOIDECTOMY  1970's  . EYE SURGERY Left 2000   "branch vein occlusion"  . EYE SURGERY Left ~ 2001   "smoothed out wrinkle"  . LEFT OOPHORECTOMY  05/1979   nicked bowel, peritonitis, colostomy; colostomy reversed 1981   . LOWER EXTREMITY ANGIOGRAM N/A 01/03/2014   Procedure: LOWER EXTREMITY ANGIOGRAM;  Surgeon: Wellington Hampshire, MD;  Location: [place] CATH LAB;  Service: Cardiovascular;  Laterality: N/A;  . Nuclear med stress test  10/2011   Small area of mild ischemia inferoapically.  [name] PARTIAL HYSTERECTOMY  1970's   left ovaries, then ovaries  removed later due tumors   . RIGHT OOPHORECTOMY  1970's     OB History    Gravida  3   Para  2   Term      Preterm      AB  1   Living  2     SAB  1   TAB      Ectopic      Multiple      Live Births  2            Home Medications    Prior to Admission medications   Medication Sig Start Date End Date Taking? Authorizing Provider  acetaminophen (TYLENOL) 325 MG tablet Take 650 mg by mouth every 6 (six) hours as needed for headache.   Yes [provider]  amiodarone (PACERONE) 200 MG tablet Take 1 tablet (200 mg total) by mouth daily. 07/06/16  Yes Camnitz, Will Hassell Done, MD  apixaban (ELIQUIS) 5 MG TABS tablet TAKE (1) TABLET BY MOUTH TWICE DAILY. 08/27/17  Yes Herminio Commons,  MD  atorvastatin (LIPITOR) 80 MG tablet Take 1 tablet (80 mg total) by mouth daily at 6 PM. 04/27/16  Yes Herminio Commons, MD  carvedilol (COREG) 25 MG tablet Take 1 tablet (25 mg total) by mouth 2 (two) times daily. 07/15/17 12/17/17 Yes Camnitz, Will Hassell Done, MD  Cholecalciferol (VITAMIN D) 2000 units tablet Take 2,000 Units by mouth daily.    Yes [provider]  clopidogrel (PLAVIX) 75 MG tablet TAKE ONE (1) TABLET BY MOUTH EVERY DAY 09/07/17  Yes Herminio Commons, MD  diltiazem (CARDIZEM CD) 360 MG 24 hr capsule Take 1 capsule (360 mg total) by mouth daily. 08/09/17  Yes Herminio Commons, MD  diphenhydramine-acetaminophen (TYLENOL PM EXTRA STRENGTH) 25-500 MG TABS tablet Take 1 tablet by mouth at bedtime as needed (Take as directed).    Yes [provider]  furosemide (LASIX) 40 MG tablet Take 1.5 tablets (60 mg total) by mouth daily. 09/30/17  Yes Memon, Jolaine Artist, MD  glucose blood (FREESTYLE LITE) test strip USE AS DIRECTED TWICE DAILY. 05/05/17  Yes Nida, Marella Chimes, MD  insulin NPH-regular Human (NOVOLIN 70/30) (70-30) 100 UNIT/ML injection Inject 40 Units into the skin See admin instructions. Inject 25 units before breakfast and 25 units before supper  - only if pre-meal blood glucose is above 90 mg/dL. 09/30/17  Yes Kathie Dike, MD  levothyroxine (SYNTHROID, LEVOTHROID) 175 MCG tablet Take 175 mcg by mouth daily before breakfast.   Yes [provider]  losartan (COZAAR) 100 MG tablet TAKE ONE (1) TABLET EACH DAY 06/17/17  Yes Herminio Commons, MD  metFORMIN (GLUCOPHAGE) 500 MG tablet TAKE ONE TABLET BY MOUTH TWICE A DAY WITH A MEAL 08/26/17  Yes Nida, Marella Chimes, MD  nitroGLYCERIN (NITROSTAT) 0.4 MG SL tablet Place 1 tablet (0.4 mg total) under the tongue every 5 (five) minutes as needed for chest pain. 11/25/17  Yes Rolland Porter, MD  potassium chloride SA (K-DUR,KLOR-CON) 20 MEQ tablet TAKE ONE (1) TABLET BY MOUTH EVERY DAY 12/01/17  Yes  Herminio Commons, MD  rOPINIRole (REQUIP) 0.5 MG tablet Take 0.5 mg by mouth at bedtime.  03/05/14  Yes [provider]    Family History Family History  Problem Relation Age of Onset  . Heart disease Mother        deceased  . Heart disease Father        deceased, heart disease  . Diabetes Brother   . Heart  disease Brother   . Thyroid disease Brother   . Heart disease Sister   . Heart disease Brother   . Thyroid disease Brother   . Lupus Daughter   . Colon cancer Neg Hx     Social History Social History   Tobacco Use  . Smoking status: Never Smoker  . Smokeless tobacco: Never Used  . Tobacco comment: Never smoked  Substance Use Topics  . Alcohol use: No    Alcohol/week: 0.0 oz  . Drug use: No     Allergies   Penicillins and Percocet [oxycodone-acetaminophen]   Review of Systems Review of Systems  Constitutional: Positive for fatigue. Negative for chills and fever.  HENT: Negative for congestion.   Eyes: Negative for visual disturbance.  Respiratory: Positive for choking and shortness of breath.   Cardiovascular: Negative for chest pain.  Gastrointestinal: Negative for abdominal pain and vomiting.  Genitourinary: Negative for dysuria and flank pain.  Musculoskeletal: Negative for back pain, neck pain and neck stiffness.  Skin: Negative for rash.  Neurological: Negative for light-headedness and headaches.     Physical Exam Updated Vital Signs BP (!) 151/62   Pulse 69   Temp 98.8 F (37.1 C) (Oral)   Resp (!) 26   Ht 5\' 3"  (1.6 m)   Wt 71.2 kg (157 lb)   SpO2 97%   BMI 27.81 kg/m   Physical Exam  Constitutional: She is oriented to person, place, and time. She appears well-developed and well-nourished.  HENT:  Head: Normocephalic and atraumatic.  Eyes: Conjunctivae are normal. Right eye exhibits no discharge. Left eye exhibits no discharge.  Neck: Normal range of motion. Neck supple. No tracheal deviation present.  Cardiovascular:  Normal rate and regular rhythm.  Pulmonary/Chest: Tachypnea noted. She has rhonchi. She has rales.  Abdominal: Soft. She exhibits no distension. There is no tenderness. There is no guarding.  Musculoskeletal:       Right lower leg: She exhibits edema.       Left lower leg: She exhibits edema.  Neurological: She is alert and oriented to person, place, and time.  Skin: Skin is warm. No rash noted.  Psychiatric: She has a normal mood and affect.  Nursing note and vitals reviewed.    ED Treatments / Results  Labs (all labs ordered are listed, but only abnormal results are displayed) Labs Reviewed  BASIC METABOLIC PANEL - Abnormal; Notable for the following components:      Result Value   Glucose, Bld 143 (*)    Creatinine, Ser 1.06 (*)    Calcium 8.5 (*)    GFR calc non Af Amer 51 (*)    GFR calc Af Amer 59 (*)    All other components within normal limits  CBC WITH DIFFERENTIAL/PLATELET - Abnormal; Notable for the following components:   WBC 12.1 (*)    RBC 3.86 (*)    Hemoglobin 10.9 (*)    HCT 35.5 (*)    Neutro Abs 10.3 (*)    Lymphs Abs 0.6 (*)    Monocytes Absolute 1.2 (*)    All other components within normal limits  HEPATIC FUNCTION PANEL - Abnormal; Notable for the following components:   Total Protein 6.3 (*)    Albumin 3.4 (*)    AST 58 (*)    ALT 107 (*)    Bilirubin, Direct 0.3 (*)    All other components within normal limits  BRAIN NATRIURETIC PEPTIDE - Abnormal; Notable for the following components:   B Natriuretic  Peptide 677.0 (*)    All other components within normal limits  TROPONIN I    EKG EKG Interpretation  Date/Time:  Friday December 17 2017 10:29:24 EDT Ventricular Rate:  68 PR Interval:    QRS Duration: 102 QT Interval:  441 QTC Calculation: 469 R Axis:   32 Text Interpretation:  Sinus rhythm LVH with secondary repolarization abnormality overall simialr previous Confirmed by Elnora Morrison [phone]) on 12/17/2017 10:34:00 AM   Radiology Dg  Chest 2 View  Result Date: 12/17/2017 CLINICAL DATA:  Shortness of breath. EXAM: CHEST - 2 VIEW COMPARISON:  11/25/2017. FINDINGS: Cardiac pacer with lead tips over the right atrium right ventricle. Heart size normal. Pulmonary vascularity normal. Diffuse bilateral pulmonary interstitial prominence suggesting pneumonitis. CHF cannot be excluded. Bibasilar atelectasis. Small left pleural effusion. No pneumothorax. IMPRESSION: 1. Cardiac pacer with lead tips over the right atrium right ventricle. Heart size stable. 2. Diffuse bilateral pulmonary interstitial prominence suggesting pneumonitis. CHF cannot be excluded. Bibasilar atelectasis. Small left pleural effusion. Electronically Signed   By: Marcello Moores  Register   On: 12/17/2017 11:28    Procedures Procedures (including critical care time)  Medications Ordered in ED Medications  furosemide (LASIX) injection 40 mg (has no administration in time range)  nitroGLYCERIN (NITROGLYN) 2 % ointment 1 inch (has no administration in time range)     Initial Impression / Assessment and Plan / ED Course  I have reviewed the triage vital signs and the nursing notes.  Pertinent labs & imaging results that were available during my care of the patient were reviewed by me and considered in my medical decision making (see chart for details).    Patient presents with clinically acute CHF exacerbation. Blood work reviewed, BNP elevated, troponin negative.  Chest x-ray consistent with mild pulmonary edema.  Patient require 2 L nasal cannula.  IV Lasix and nitro ordered.  Plan for telemetry admission to the hospital for diuresis and further management.  The patients results and plan were reviewed and discussed.   Any x-rays performed were independently reviewed by myself.   Differential diagnosis were considered with the presenting HPI.  Medications  furosemide (LASIX) injection 40 mg (has no administration in time range)  nitroGLYCERIN (NITROGLYN) 2 % ointment  1 inch (has no administration in time range)    Vitals:   12/17/17 1100 12/17/17 1130 12/17/17 1200 12/17/17 1230  BP: (!) 149/64 (!) 159/63 (!) 151/62 (!) 142/61  Pulse: 67 68 69 66  Resp: (!) 21 (!) 28 (!) 26 (!) 25  Temp:      TempSrc:      SpO2: 95% 97% 97% 95%  Weight:      Height:        Final diagnoses:  Acute diastolic congestive heart failure (HCC)  Hypoxia    Admission/ observation were discussed with the admitting physician, patient and/or family and they are comfortable with the plan.   Final Clinical Impressions(s) / ED Diagnoses   Final diagnoses:  Acute diastolic congestive heart failure [facility])  Hypoxia    ED Discharge Orders    None       Elnora Morrison, MD 12/18/17 [phone]

## 2017-12-17 NOTE — H&P (Signed)
History and Physical    Virginia Huber LXB:[id number] DOB: 07-Feb-1946 DOA: 12/17/2017  Referring MD/NP/PA: Dr. Reather Converse. PCP: Sharilyn Sites, MD  Patient coming from: Home   Chief Complaint: worsening SOB, feeling torso congested.  HPI: Virginia Huber is a 72 y.o. female with PMH significant for tachy-brady syndrome, recent cardioversion, diastolic HF, PAF (chronically on Eliquis), overweight, GERD, hypothyroidism and type 2 diabetes; who came to ED complaining of SOB and feeling congested since Wednesday evening. Patient had cardioversion on that day, everything uneventful, but with subsequent progression of SOB. She reported dry non productive cough, expressed medication compliance and denies hemoptysis, CP, nausea, vomiting, abd pain, HA's, focal weakness, fever, chills, sick contacts or any other complaints.  Of note; she expressed no using salt, but is no following sodium intake. In ED was found with elevated BNP, normal WBC's, no fever, sinus rhythm and no ischemia on EKD. Troponin was neg. CXR with vascular congestion and interstitial edema. Patient received one dose of Iv lasix, started on oxygen supplementation for comfort and [facility] called to admit patient for further evaluation and management of CHF exacerbation.  Past Medical/Surgical History: Past Medical History:  Diagnosis Date  . Arthritis   . Atrial fibrillation and flutter ([place])    a. h/o PAF/flutter during admission in 2013 for PNA. b. PAF during adm for NSTEMI 07/2015, subsequent paroxysms since then.  . B12 deficiency anemia   . Cardiac tamponade 06/2016  . Coronary artery disease 11/30/2014   a. remote MI. b. h/o PTCA with scoring balloon to OM1 11/2014. c. NSTEMI 03/2015 s/p DES to prox-mid Cx. d. NSTEMI 07/2015 s/p scoring balloon/PTCA/DES to dRCA with PAF during that admission  . Cutaneous lupus erythematosus   . GERD (gastroesophageal reflux disease)   . Heart block   . History of blood transfusion 1980's   2nd  surgical procedures  . HTN (hypertension)   . Hypercholesteremia   . Hypothyroidism   . Myocardial infarction ([place]) 02/2012  . Ovarian tumor   . PAD (peripheral artery disease) ([place])    a. s/p LE angio 2015; followed by Dr. Fletcher Anon - managed medically.  . Pain with urination 05/08/2015  . Paroxysmal atrial flutter ([place])   . Pericardial effusion    a. 06/2016 after ppm - s/p pericardiocentesis.  . Superficial fungus infection of skin 06/29/2013  . Tachy-brady syndrome ([place])    a. s/p [employer] PPM 06/2016, c/b lead perf/pericardial effusion.  [name] TIA (transient ischemic attack) 08/2001; ~ 2006  . Type II diabetes mellitus ([place])   . UTI (urinary tract infection) 05/08/2013   Past Surgical History:  Procedure Laterality Date  . ABDOMINAL AORTAGRAM N/A 01/03/2014   Procedure: ABDOMINAL [name];  Surgeon: Wellington Hampshire, MD;  Location: [place] CATH LAB;  Service: Cardiovascular;  Laterality: N/A;  . ABDOMINAL HYSTERECTOMY  1972   "partial"  . APPENDECTOMY  1970's  . CARDIAC CATHETERIZATION  2008   Tiny OM-2 with 90% narrowing. Med tx.  [name] CARDIAC CATHETERIZATION N/A 11/30/2014   Procedure: Left Heart Cath and Coronary Angiography;  Surgeon: Troy Sine, MD; LAD 20%, CFX 50%, OM1 95%, right PLB 30%, LV normal   . CARDIAC CATHETERIZATION N/A 11/30/2014   Procedure: Coronary Balloon Angioplasty;  Surgeon: Troy Sine, MD;  Angiosculpt scoring balloon and PTCA to the OM1 reducing stenosis from 95% to less than 10%  . CARDIAC CATHETERIZATION N/A 04/03/2015   Procedure: Left Heart Cath and Coronary Angiography;  Surgeon: Jolaine Artist, MD; dLAD 50%, CFX 90%,  OM1 100%, PLA 15%, LVEDP 13    . CARDIAC CATHETERIZATION N/A 04/03/2015   Procedure: Coronary Stent Intervention;  Surgeon: Sherren Mocha, MD; 3.0x18 mm Xience DES to the CFX    . CARDIAC CATHETERIZATION N/A 08/02/2015   Procedure: Left Heart Cath and Coronary Angiography;  Surgeon: Troy Sine, MD;  Location: [place] CV LAB;   Service: Cardiovascular;  Laterality: N/A;  . CARDIAC CATHETERIZATION N/A 08/02/2015   Procedure: Coronary Stent Intervention;  Surgeon: Troy Sine, MD;  Location: [place] CV LAB;  Service: Cardiovascular;  Laterality: N/A;  . CARDIAC CATHETERIZATION N/A 06/25/2016   Procedure: Pericardiocentesis;  Surgeon: Will Meredith Leeds, MD;  Location: [place] [facility];  Service: Cardiovascular;  Laterality: N/A;  . cardiac stents    . CARDIOVERSION N/A 12/15/2017   Procedure: CARDIOVERSION;  Surgeon: Acie Fredrickson Wonda Cheng, MD;  Location: [facility] ENDOSCOPY;  Service: Cardiovascular;  Laterality: N/A;  . CHOLECYSTECTOMY OPEN  1990's  . COLONOSCOPY  2005   Dr. Laural Golden: pancolonic divericula, polyp, path unknown currently  . COLONOSCOPY  2012   Dr. Oneida Alar: Normal TI, scattered diverticula in entire colon, small internal hemorrhoids, normal colon biopsies. Colonoscopy in 5-10 years.   . COLOSTOMY  05/1979  . COLOSTOMY REVERSAL  11/1979  . EP IMPLANTABLE DEVICE N/A 06/25/2016   Procedure: Lead Revision/Repair;  Surgeon: Will Meredith Leeds, MD;  Location: [place] CV LAB;  Service: Cardiovascular;  Laterality: N/A;  . EP IMPLANTABLE DEVICE N/A 06/25/2016   Procedure: Pacemaker Implant;  Surgeon: Will Meredith Leeds, MD;  Location: [place] CV LAB;  Service: Cardiovascular;  Laterality: N/A;  . EXCISIONAL HEMORRHOIDECTOMY  1970's  . EYE SURGERY Left 2000   "branch vein occlusion"  . EYE SURGERY Left ~ 2001   "smoothed out wrinkle"  . LEFT OOPHORECTOMY  05/1979   nicked bowel, peritonitis, colostomy; colostomy reversed 1981   . LOWER EXTREMITY ANGIOGRAM N/A 01/03/2014   Procedure: LOWER EXTREMITY ANGIOGRAM;  Surgeon: Wellington Hampshire, MD;  Location: [place] CATH LAB;  Service: Cardiovascular;  Laterality: N/A;  . Nuclear med stress test  10/2011   Small area of mild ischemia inferoapically.  [name] PARTIAL HYSTERECTOMY  1970's   left ovaries, then ovaries removed later due tumors   . RIGHT OOPHORECTOMY  1970's    Social History:  reports that she has never smoked. She has never used smokeless tobacco. She reports that she does not drink alcohol or use drugs.  Allergies: Allergies  Allergen Reactions  . Penicillins Hives    Has patient had a PCN reaction causing immediate rash, facial/tongue/throat swelling, SOB or lightheadedness with hypotension: Yes Has patient had a PCN reaction causing severe rash involving mucus membranes or skin necrosis: No Has patient had a PCN reaction that required hospitalization No Has patient had a PCN reaction occurring within the last 10 years: No If all of the above answers are "NO", then may proceed with Cephalosporin use.  [name] Percocet [Oxycodone-Acetaminophen] Nausea And Vomiting    Family History:  Family History  Problem Relation Age of Onset  . Heart disease Mother        deceased  . Heart disease Father        deceased, heart disease  . Diabetes Brother   . Heart disease Brother   . Thyroid disease Brother   . Heart disease Sister   . Heart disease Brother   . Thyroid disease Brother   . Lupus Daughter   . Colon cancer Neg Hx  Prior to Admission medications   Medication Sig Start Date End Date Taking? Authorizing Provider  acetaminophen (TYLENOL) 325 MG tablet Take 650 mg by mouth every 6 (six) hours as needed for headache.   Yes [provider]  amiodarone (PACERONE) 200 MG tablet Take 1 tablet (200 mg total) by mouth daily. 07/06/16  Yes Camnitz, Will Hassell Done, MD  apixaban (ELIQUIS) 5 MG TABS tablet TAKE (1) TABLET BY MOUTH TWICE DAILY. 08/27/17  Yes Herminio Commons, MD  atorvastatin (LIPITOR) 80 MG tablet Take 1 tablet (80 mg total) by mouth daily at 6 PM. 04/27/16  Yes Herminio Commons, MD  carvedilol (COREG) 25 MG tablet Take 1 tablet (25 mg total) by mouth 2 (two) times daily. 07/15/17 12/17/17 Yes Camnitz, Will Hassell Done, MD  Cholecalciferol (VITAMIN D) 2000 units tablet Take 2,000 Units by mouth daily.    Yes [provider]  clopidogrel (PLAVIX) 75 MG tablet TAKE ONE (1) TABLET BY MOUTH EVERY DAY 09/07/17  Yes Herminio Commons, MD  diltiazem (CARDIZEM CD) 360 MG 24 hr capsule Take 1 capsule (360 mg total) by mouth daily. 08/09/17  Yes Herminio Commons, MD  diphenhydramine-acetaminophen (TYLENOL PM EXTRA STRENGTH) 25-500 MG TABS tablet Take 1 tablet by mouth at bedtime as needed (Take as directed).    Yes [provider]  furosemide (LASIX) 40 MG tablet Take 1.5 tablets (60 mg total) by mouth daily. 09/30/17  Yes Memon, Jolaine Artist, MD  glucose blood (FREESTYLE LITE) test strip USE AS DIRECTED TWICE DAILY. 05/05/17  Yes Nida, Marella Chimes, MD  insulin NPH-regular Human (NOVOLIN 70/30) (70-30) 100 UNIT/ML injection Inject 40 Units into the skin See admin instructions. Inject 25 units before breakfast and 25 units before supper  - only if pre-meal blood glucose is above 90 mg/dL. 09/30/17  Yes Kathie Dike, MD  levothyroxine (SYNTHROID, LEVOTHROID) 175 MCG tablet Take 175 mcg by mouth daily before breakfast.   Yes [provider]  losartan (COZAAR) 100 MG tablet TAKE ONE (1) TABLET EACH DAY 06/17/17  Yes Herminio Commons, MD  metFORMIN (GLUCOPHAGE) 500 MG tablet TAKE ONE TABLET BY MOUTH TWICE A DAY WITH A MEAL 08/26/17  Yes Nida, Marella Chimes, MD  nitroGLYCERIN (NITROSTAT) 0.4 MG SL tablet Place 1 tablet (0.4 mg total) under the tongue every 5 (five) minutes as needed for chest pain. 11/25/17  Yes Rolland Porter, MD  potassium chloride SA (K-DUR,KLOR-CON) 20 MEQ tablet TAKE ONE (1) TABLET BY MOUTH EVERY DAY 12/01/17  Yes Herminio Commons, MD  rOPINIRole (REQUIP) 0.5 MG tablet Take 0.5 mg by mouth at bedtime.  03/05/14  Yes [provider]    Review of Systems:  Constitutional: Denies fever, chills, diaphoresis, appetite change and fatigue.  HEENT: Denies photophobia, eye pain, redness, hearing loss, ear pain, congestion, sore throat, rhinorrhea, sneezing, mouth sores,  trouble swallowing, neck pain, neck stiffness and tinnitus.     Cardiovascular: Denies chest pain, palpitations and leg swelling.  Gastrointestinal: Denies nausea, vomiting, abdominal pain, diarrhea, constipation, blood in stool and abdominal distention.  Genitourinary: Denies dysuria, urgency, frequency, hematuria, flank pain and difficulty urinating.  Endocrine: Denies: hot or cold intolerance, sweats, changes in hair or nails, polyuria, polydipsia. Musculoskeletal: Denies myalgias, back pain, joint swelling, arthralgias and gait problem.  Skin: Denies pallor, rash and wound.  Neurological: Denies dizziness, seizures, syncope, weakness, light-headedness, numbness and headaches.  Hematological: Denies adenopathy. Easy bruising, personal or family bleeding history  Psychiatric/Behavioral: Denies suicidal ideation, mood changes, confusion, nervousness, sleep  disturbance and agitation  Physical Exam: Vitals:   12/17/17 1330 12/17/17 1400 12/17/17 1430 12/17/17 1454  BP: (!) 146/58 (!) 154/64 (!) 144/60 (!) 141/53  Pulse:  66 63 65  Resp: (!) 31 (!) 22 19 18   Temp:    98.8 F (37.1 C)  TempSrc:    Oral  SpO2: 97% 95% 93% 96%  Weight:    71.2 kg (157 lb)  Height:    5\' 3"  (1.6 m)   Constitutional: NAD, afebrile, no complaining of CP. Reports SOB with exertion and mild orthopnea. Eyes: PERRL, lids and conjunctivae normal, no icterus. ENMT: Mucous membranes are moist. Posterior pharynx clear of any exudate or lesions. Neck: normal, supple, no masses, no thyromegaly Respiratory: no wheezing; positive rhonchi and fine crackles at the bases. No accessory muscle use. Using 2L [place] supplementation. Cardiovascular: Regular rate, S1 and S2, no murmurs / rubs / gallops. No extremity edema. 2+ pedal pulses. No carotid bruits.  Abdomen: no tenderness, no masses palpated. No hepatosplenomegaly. Bowel sounds positive.  Musculoskeletal: no clubbing / cyanosis. No joint deformity upper and lower  extremities. Good ROM, no contractures. Normal muscle tone.  Skin: no rashes, lesions, ulcers. No induration Neurologic: CN 2-12 grossly intact. Sensation intact, DTR normal. Strength 5/5 in all 4.  Psychiatric: Normal judgment and insight. Alert and oriented x 3. Normal mood.   Labs on Admission: I have personally reviewed the following labs and imaging studies  CBC: Recent Labs  Lab 12/14/17 1316 12/17/17 1035  WBC 9.1 12.1*  NEUTROABS  --  10.3*  HGB 11.8 10.9*  HCT 36.6 35.5*  MCV 91 92.0  PLT 259 626   Basic Metabolic Panel: Recent Labs  Lab 12/14/17 1316 12/17/17 1035  NA 143 138  K 4.3 3.5  CL 104 104  CO2 23 25  GLUCOSE 109* 143*  BUN 14 14  CREATININE 1.15* 1.06*  CALCIUM 9.2 8.5*   GFR: Estimated Creatinine Clearance: 45.4 mL/min (A) (by C-G formula based on SCr of 1.06 mg/dL (H)). Liver Function Tests: Recent Labs  Lab 12/17/17 1043  AST 58*  ALT 107*  ALKPHOS 65  BILITOT 1.2  PROT 6.3*  ALBUMIN 3.4*   Cardiac Enzymes: Recent Labs  Lab 12/17/17 1035  TROPONINI <0.03   CBG: Recent Labs  Lab 12/15/17 0836 12/17/17 1620  GLUCAP 122* 114*   Urine analysis:    Component Value Date/Time   COLORURINE STRAW (A) 09/17/2015 0001   APPEARANCEUR HAZY (A) 09/17/2015 0001   APPEARANCEUR Cloudy (A) 07/03/2015 1530   LABSPEC 1.010 09/17/2015 0001   PHURINE 5.0 09/17/2015 0001   GLUCOSEU >1000 (A) 09/17/2015 0001   HGBUR NEGATIVE 09/17/2015 0001   BILIRUBINUR NEGATIVE 09/17/2015 0001   BILIRUBINUR Negative 07/03/2015 1530   KETONESUR NEGATIVE 09/17/2015 0001   PROTEINUR NEGATIVE 09/17/2015 0001   UROBILINOGEN 0.2 02/15/2015 1335   NITRITE NEGATIVE 09/17/2015 0001   LEUKOCYTESUR NEGATIVE 09/17/2015 0001   LEUKOCYTESUR 1+ (A) 07/03/2015 1530    Radiological Exams on Admission: Dg Chest 2 View  Result Date: 12/17/2017 CLINICAL DATA:  Shortness of breath. EXAM: CHEST - 2 VIEW COMPARISON:  11/25/2017. FINDINGS: Cardiac pacer with lead tips over  the right atrium right ventricle. Heart size normal. Pulmonary vascularity normal. Diffuse bilateral pulmonary interstitial prominence suggesting pneumonitis. CHF cannot be excluded. Bibasilar atelectasis. Small left pleural effusion. No pneumothorax. IMPRESSION: 1. Cardiac pacer with lead tips over the right atrium right ventricle. Heart size stable. 2. Diffuse bilateral pulmonary interstitial prominence suggesting pneumonitis. CHF cannot  be excluded. Bibasilar atelectasis. Small left pleural effusion. Electronically Signed   By: Marcello Moores  Register   On: 12/17/2017 11:28    EKG: Independently reviewed.  Sinus rhythm, normal rate, no acute ischemic changes appreciated on EKG.  Assessment/Plan 1-Acute on chronic diastolic HF (heart failure) (HCC) -Admit to telemetry -Start IV Lasix, follow daily weights and strict intake and output -Patient with recent 2D echo after receiving cardioversion demonstrating preserved ejection fraction and just diastolic heart failure. -Presumably exacerbation secondary to diet indiscretion. -Education regarding importance about maintaining her sodium intake in the 2 g range on daily basis was provided. -Continue beta-blocker and losartan. -Follow clinical response. -Check troponin.  2-Mixed hyperlipidemia -Continue statins  3-Essential hypertension -Blood pressure stable and well-controlled -Continue current antihypertensive regimen and follow vital signs. -Heart healthy diet has been ordered.  4-Hypothyroidism -Check TSH -Continue Synthroid  5-DM type 2 causing vascular disease (HCC) -Check A1c -Hold oral hypoglycemic regimen -Will use low-dose Levemir and sliding scale insulin.  6-OSA (obstructive sleep apnea) -Patient intermittently using oxygen; not on CPAP at home.  7-overweight -Body mass index is 27.81 kg/m. -low calorie and portion control education provided.  8-GERFD -continue PPI  9-Tachy-brady syndrome ([place]) -Status post cardioversion  on 12/15/2017 -Continue pacerone, Cardizem and beta-blocker -Continue Eliquis -Outpatient follow-up with EP. -CHADsVASC score 4  10-CKD (chronic kidney disease) stage 3, GFR 30-59 ml/min (HCC) -Appears to be stable and at baseline -Will follow closely electrolytes and renal function trend while receiving acute diuresis.   DVT prophylaxis: Chronically on Eliquis. Code Status: Full code Family Communication:  husband and daughter at bedside.   Disposition Plan:  Home once breathing/volume status stabilized. Consults called: None Admission status: Inpatient, length of stay more than 2 midnights; telemetry bed.   Time Spent: 70 minutes  Barton Dubois MD [facility] Pager [phone]  If 7PM-7AM, please contact night-coverage [email or web address] Password [facility]  12/17/2017, 5:57 PM

## 2017-12-17 NOTE — ED Triage Notes (Signed)
Pt states she had a cardioversion on Wednesday and since has been having SOB. This morning her O2 sat was in the 70s, used a breathing tx with O2 improving to 80s. Pt is on two blood thinners.

## 2017-12-18 LAB — GLUCOSE, CAPILLARY
GLUCOSE-CAPILLARY: 310 mg/dL — AB (ref 70–99)
Glucose-Capillary: 140 mg/dL — ABNORMAL HIGH (ref 70–99)
Glucose-Capillary: 205 mg/dL — ABNORMAL HIGH (ref 70–99)
Glucose-Capillary: 190 mg/dL — ABNORMAL HIGH (ref 70–99)
Glucose-Capillary: 313 mg/dL — ABNORMAL HIGH (ref 70–99)

## 2017-12-18 LAB — BASIC METABOLIC PANEL
Anion gap: 9 (ref 5–15)
BUN: 14 mg/dL (ref 8–23)
CO2: 28 mmol/L (ref 22–32)
CREATININE: 1.04 mg/dL — AB (ref 0.44–1.00)
Calcium: 8.1 mg/dL — ABNORMAL LOW (ref 8.9–10.3)
Chloride: 103 mmol/L (ref 98–111)
GFR calc Af Amer: >60 mL/min (ref >60)
GFR calc non Af Amer: 52 mL/min — ABNORMAL LOW (ref >60)
GLUCOSE: 143 mg/dL — AB (ref 70–99)
Potassium: 3.2 mmol/L — ABNORMAL LOW (ref 3.5–5.1)
Sodium: 140 mmol/L (ref 135–145)

## 2017-12-18 LAB — TROPONIN I
Troponin I: <0.03 ng/mL (ref <0.03)
Troponin I: <0.03 ng/mL (ref <0.03)

## 2017-12-18 MED ORDER — LEVOTHYROXINE SODIUM 75 MCG PO TABS
150.0000 ug | ORAL_TABLET | Freq: Every day | ORAL | Status: DC
  Administered 2017-12-19 – 2017-12-21 (×3): 150 ug via ORAL
  Filled 2017-12-18 (×3): qty 2

## 2017-12-18 NOTE — Progress Notes (Signed)
PROGRESS NOTE    Virginia Huber  URK:[id number] DOB: January 21, 1946 DOA: 12/17/2017 PCP: Sharilyn Sites, MD   Brief Narrative:  72 y.o. female with PMH significant for tachy-brady syndrome, recent cardioversion, diastolic HF, PAF (chronically on Eliquis), overweight, GERD, hypothyroidism and type 2 diabetes; who came to ED complaining of SOB and feeling congested since Wednesday evening. Patient had cardioversion on that day, everything uneventful, but with subsequent progression of SOB. She reported dry non productive cough, expressed medication compliance and denies hemoptysis, CP, nausea, vomiting, abd pain, HA's, focal weakness, fever, chills, sick contacts or any other complaints.  Of note; she expressed no using salt, but is no following sodium intake.  Assessment & Plan: 1-acute on chronic diastolic heart failure -Continue monitoring on telemetry -Patient diuresing appropriately -Continue IV Lasix -Follow daily weights, strict intake and output and low-sodium diet. -Troponin has remained stable and patient denies chest pain.  2-mixed hyperlipidemia -Continue statins  3-essential hypertension -Blood pressure stable and well-controlled -Continue current antihypertensive regimen possible her vital signs.  4-hypothyroidism -TSH slightly low -continue Synthroid; but dose adjusted to 150 mcg daily.  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 -A1C 6.8 -continue levemir and SSI  6-OSA -not on CPAP -continue intermittent O2 supplementation   7-GERD -continue PPI  8-CKD stage 3 -Cr has remained stable and at baseline -continue to monitor trend  -electrolytes stable  9-tachy-brady syndrome/PAF -status post cardioversion on 12/15/17 -has remained on sinus rhythm  -rate well controlled -continue Pacerone, Cardizem and b-blocker -continue eliquis -monitoring on telemetry  -CHADsVASC score 4 -continue outpatient follow up with EP   DVT prophylaxis:  eliquis  Code Status: Full code Family Communication: Husband at bedside Disposition Plan: Remains inpatient, continue IV diuresis, follow clinical improvement; discharge home once respiratory status at baseline.  Consultants:   None  Procedures:   See below for x-ray reports.  Antimicrobials:  Anti-infectives (From admission, onward)   None     Subjective: Pain, reports her breathing continued to improve; patient having increased urine output.  Objective: Vitals:   12/18/17 0400 12/18/17 0525 12/18/17 1331 12/18/17 1637  BP:  (!) 158/59 (!) 149/57   Pulse:  63 65 63  Resp:  19 19   Temp:  98.1 F (36.7 C) 98.7 F (37.1 C) 98.4 F (36.9 C)  TempSrc:  Oral Oral Oral  SpO2:  95% 95% 97%  Weight: 70.7 kg (155 lb 13.8 oz)     Height:        Intake/Output Summary (Last 24 hours) at 12/18/2017 1840 Last data filed at 12/18/2017 1800 Gross per 24 hour  Intake 600 ml  Output 1500 ml  Net -900 ml   Filed Weights   12/17/17 1026 12/17/17 1454 12/18/17 0400  Weight: 71.2 kg (157 lb) 71.2 kg (157 lb) 70.7 kg (155 lb 13.8 oz)    Examination: General exam: Alert, awake, oriented x 3; reports feeling better and breathing easier; Even is still having some orthopnea feeling and having to catch her breath with exertion. Respiratory system: No frank crackles, decreased breath sounds at the bases, no wheezing, no using accessory muscles.   Cardiovascular system:Rate controlled, no rubs, no gallops. S1 and S2; no murmurs.  Gastrointestinal system: Abdomen is nondistended, soft and nontender. No organomegaly or masses felt. Normal bowel sounds heard. Central nervous system: Alert and oriented. No focal neurological deficits.  Cranial nerves grossly intact. Extremities: Edema bilaterally, no cyanosis, no clubbing, no joint deformity. Skin: No rashes, lesions or ulcers Psychiatry:  Judgement and insight appear normal. Mood & affect appropriate.    Data Reviewed: I have personally  reviewed following labs and imaging studies  CBC: Recent Labs  Lab 12/14/17 1316 12/17/17 1035  WBC 9.1 12.1*  NEUTROABS  --  10.3*  HGB 11.8 10.9*  HCT 36.6 35.5*  MCV 91 92.0  PLT 259 993   Basic Metabolic Panel: Recent Labs  Lab 12/14/17 1316 12/17/17 1035 12/18/17 0635  NA 143 138 140  K 4.3 3.5 3.2*  CL 104 104 103  CO2 23 25 28   GLUCOSE 109* 143* 143*  BUN 14 14 14   CREATININE 1.15* 1.06* 1.04*  CALCIUM 9.2 8.5* 8.1*   GFR: Estimated Creatinine Clearance: 46.1 mL/min (A) (by C-G formula based on SCr of 1.04 mg/dL (H)).   Liver Function Tests: Recent Labs  Lab 12/17/17 1043  AST 58*  ALT 107*  ALKPHOS 65  BILITOT 1.2  PROT 6.3*  ALBUMIN 3.4*   Cardiac Enzymes: Recent Labs  Lab 12/17/17 1035 12/17/17 1827 12/18/17 0016 12/18/17 0635  TROPONINI <0.03 <0.03 <0.03 <0.03   HbA1C: Recent Labs    12/17/17 1827  HGBA1C 6.8*   CBG: Recent Labs  Lab 12/17/17 1620 12/17/17 2153 12/18/17 0729 12/18/17 1108 12/18/17 1628  GLUCAP 114* 244* 140* 205* 190*   Thyroid Function Tests: Recent Labs    12/17/17 1827  TSH 0.059*   Urine analysis:    Component Value Date/Time   COLORURINE STRAW (A) 09/17/2015 0001   APPEARANCEUR HAZY (A) 09/17/2015 0001   APPEARANCEUR Cloudy (A) 07/03/2015 1530   LABSPEC 1.010 09/17/2015 0001   PHURINE 5.0 09/17/2015 0001   GLUCOSEU >1000 (A) 09/17/2015 0001   HGBUR NEGATIVE 09/17/2015 0001   BILIRUBINUR NEGATIVE 09/17/2015 0001   BILIRUBINUR Negative 07/03/2015 1530   KETONESUR NEGATIVE 09/17/2015 0001   PROTEINUR NEGATIVE 09/17/2015 0001   UROBILINOGEN 0.2 02/15/2015 1335   NITRITE NEGATIVE 09/17/2015 0001   LEUKOCYTESUR NEGATIVE 09/17/2015 0001   LEUKOCYTESUR 1+ (A) 07/03/2015 1530   Radiology Studies: Dg Chest 2 View  Result Date: 12/17/2017 CLINICAL DATA:  Shortness of breath. EXAM: CHEST - 2 VIEW COMPARISON:  11/25/2017. FINDINGS: Cardiac pacer with lead tips over the right atrium right ventricle.  Heart size normal. Pulmonary vascularity normal. Diffuse bilateral pulmonary interstitial prominence suggesting pneumonitis. CHF cannot be excluded. Bibasilar atelectasis. Small left pleural effusion. No pneumothorax. IMPRESSION: 1. Cardiac pacer with lead tips over the right atrium right ventricle. Heart size stable. 2. Diffuse bilateral pulmonary interstitial prominence suggesting pneumonitis. CHF cannot be excluded. Bibasilar atelectasis. Small left pleural effusion. Electronically Signed   By: Marcello Moores  Register   On: 12/17/2017 11:28    Scheduled Meds: . amiodarone  200 mg Oral Daily  . apixaban  5 mg Oral BID  . atorvastatin  80 mg Oral q1800  . carvedilol  25 mg Oral BID WC  . cholecalciferol  2,000 Units Oral Daily  . clopidogrel  75 mg Oral Daily  . diltiazem  360 mg Oral Daily  . furosemide  40 mg Intravenous Q12H  . insulin aspart  0-9 Units Subcutaneous TID WC  . insulin detemir  7 Units Subcutaneous QHS  . levothyroxine  175 mcg Oral QAC breakfast  . losartan  50 mg Oral Daily  . pantoprazole  40 mg Oral Daily  . potassium chloride SA  20 mEq Oral Daily  . rOPINIRole  0.5 mg Oral QHS  . sodium chloride flush  3 mL Intravenous Q12H   Continuous Infusions: . sodium  chloride       LOS: 1 day    Time spent: 30 minutes   Barton Dubois, MD [facility] Pager [phone]  If 7PM-7AM, please contact night-coverage [email or web address] Password [facility] 12/18/2017, 6:40 PM

## 2017-12-19 LAB — BASIC METABOLIC PANEL
Anion gap: 8 (ref 5–15)
BUN: 15 mg/dL (ref 8–23)
CO2: 29 mmol/L (ref 22–32)
CREATININE: 1.03 mg/dL — AB (ref 0.44–1.00)
Calcium: 8.4 mg/dL — ABNORMAL LOW (ref 8.9–10.3)
Chloride: 104 mmol/L (ref 98–111)
GFR calc Af Amer: >60 mL/min (ref >60)
GFR, EST NON AFRICAN AMERICAN: 53 mL/min — AB (ref >60)
GLUCOSE: 186 mg/dL — AB (ref 70–99)
Potassium: 3.2 mmol/L — ABNORMAL LOW (ref 3.5–5.1)
SODIUM: 141 mmol/L (ref 135–145)

## 2017-12-19 LAB — GLUCOSE, CAPILLARY
GLUCOSE-CAPILLARY: 175 mg/dL — AB (ref 70–99)
GLUCOSE-CAPILLARY: 314 mg/dL — AB (ref 70–99)
Glucose-Capillary: 202 mg/dL — ABNORMAL HIGH (ref 70–99)
Glucose-Capillary: 293 mg/dL — ABNORMAL HIGH (ref 70–99)

## 2017-12-19 MED ORDER — POTASSIUM CHLORIDE CRYS ER 20 MEQ PO TBCR
40.0000 meq | EXTENDED_RELEASE_TABLET | Freq: Every day | ORAL | Status: DC
  Administered 2017-12-20 – 2017-12-21 (×2): 40 meq via ORAL
  Filled 2017-12-19 (×2): qty 2

## 2017-12-19 MED ORDER — POTASSIUM CHLORIDE CRYS ER 20 MEQ PO TBCR
20.0000 meq | EXTENDED_RELEASE_TABLET | Freq: Once | ORAL | Status: AC
  Administered 2017-12-19: 20 meq via ORAL
  Filled 2017-12-19: qty 1

## 2017-12-19 NOTE — Progress Notes (Signed)
PROGRESS NOTE    Virginia Huber  BTD:[id number] DOB: [birth year] DOA: 12/17/2017 PCP: Sharilyn Sites, MD   Brief Narrative:  72 y.o. female with PMH significant for tachy-brady syndrome, recent cardioversion, diastolic HF, PAF (chronically on Eliquis), overweight, GERD, hypothyroidism and type 2 diabetes; who came to ED complaining of SOB and feeling congested since Wednesday evening. Patient had cardioversion on that day, everything uneventful, but with subsequent progression of SOB. She reported dry non productive cough, expressed medication compliance and denies hemoptysis, CP, nausea, vomiting, abd pain, HA's, focal weakness, fever, chills, sick contacts or any other complaints.  Of note; she expressed no using salt, but is no following sodium intake.  Assessment & Plan: 1-acute on chronic diastolic heart failure -Continue monitoring on telemetry -Patient diuresing appropriately -Continue IV Lasix -Follow daily weights, strict intake and output and low-sodium diet. -Troponin has remained stable and patient denies chest pain. -repeat CXR in am -cardiology consulted  -since admission 4.7 L out  2-mixed hyperlipidemia -Continue statins  3-essential hypertension -Blood pressure stable and well-controlled -Continue current antihypertensive regimen possible her vital signs.  4-hypothyroidism -TSH slightly low -continue Synthroid; dose adjusted to 14mcg daily.  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 -A1C 6.8 -continue levemir and SSI  6-OSA -not on CPAP -continue intermittent O2 supplementation   7-GERD -continue PPI  8-CKD stage 3 -Cr has remained stable and at baseline -continue to monitor trend  -electrolytes stable  9-tachy-brady syndrome/PAF -status post cardioversion on 12/15/17 -has remained on sinus rhythm  -rate well controlled -continue Pacerone, Cardizem and b-blocker -continue eliquis -monitoring on telemetry  -CHADsVASC score  4 -continue outpatient follow up with EP   DVT prophylaxis: eliquis  Code Status: Full code Family Communication: Husband at bedside Disposition Plan: Remains inpatient, continue IV diuresis, follow clinical improvement; discharge home once respiratory status at baseline.  Consultants:   Cardiology   Procedures:   See below for x-ray reports.  Antimicrobials:  Anti-infectives (From admission, onward)   None     Subjective: Still SOB on exertion and reporting orthopnea. No CP. Patient is afebrile. Reported good urine output.   Objective: Vitals:   12/18/17 2055 12/19/17 0500 12/19/17 0633 12/19/17 1443  BP: (!) 138/55  (!) 135/57 (!) 141/54  Pulse: (!) 59  (!) 59 (!) 58  Resp: 19  18 16   Temp: 98.4 F (36.9 C)  98.2 F (36.8 C) 98.9 F (37.2 C)  TempSrc: Oral  Oral Oral  SpO2: 98%  97% 96%  Weight:  69.7 kg (153 lb 9 oz)    Height:        Intake/Output Summary (Last 24 hours) at 12/19/2017 1519 Last data filed at 12/19/2017 1024 Gross per 24 hour  Intake 802 ml  Output 2150 ml  Net -1348 ml   Filed Weights   12/17/17 1454 12/18/17 0400 12/19/17 0500  Weight: 71.2 kg (157 lb) 70.7 kg (155 lb 13.8 oz) 69.7 kg (153 lb 9 oz)    Examination: General exam: Alert, awake, oriented x 3; reports feeling SOB with exertion. No CP; expressed positive orthopnea.  Respiratory system: good air movement bilaterally, no wheezing; decrease BS at bases. Cardiovascular system:RRR. No murmurs, rubs, gallops. Gastrointestinal system: Abdomen is nondistended, soft and nontender. No organomegaly or masses felt. Normal bowel sounds heard. Central nervous system: Alert and oriented. No focal neurological deficits. Extremities: No Cyanosis, no clubbing, no edema.  Skin: No rashes, lesions or ulcers Psychiatry: Judgement and insight appear normal. Mood & affect appropriate.  Data Reviewed: I have personally reviewed following labs and imaging studies  CBC: Recent Labs  Lab  12/14/17 1316 12/17/17 1035  WBC 9.1 12.1*  NEUTROABS  --  10.3*  HGB 11.8 10.9*  HCT 36.6 35.5*  MCV 91 92.0  PLT 259 426   Basic Metabolic Panel: Recent Labs  Lab 12/14/17 1316 12/17/17 1035 12/18/17 0635 12/19/17 0620  NA 143 138 140 141  K 4.3 3.5 3.2* 3.2*  CL 104 104 103 104  CO2 23 25 28 29   GLUCOSE 109* 143* 143* 186*  BUN 14 14 14 15   CREATININE 1.15* 1.06* 1.04* 1.03*  CALCIUM 9.2 8.5* 8.1* 8.4*   GFR: Estimated Creatinine Clearance: 46.2 mL/min (A) (by C-G formula based on SCr of 1.03 mg/dL (H)).   Liver Function Tests: Recent Labs  Lab 12/17/17 1043  AST 58*  ALT 107*  ALKPHOS 65  BILITOT 1.2  PROT 6.3*  ALBUMIN 3.4*   Cardiac Enzymes: Recent Labs  Lab 12/17/17 1035 12/17/17 1827 12/18/17 0016 12/18/17 0635  TROPONINI <0.03 <0.03 <0.03 <0.03   HbA1C: Recent Labs    12/17/17 1827  HGBA1C 6.8*   CBG: Recent Labs  Lab 12/18/17 1628 12/18/17 2056 12/18/17 2057 12/19/17 0737 12/19/17 1143  GLUCAP 190* 313* 310* 175* 314*   Thyroid Function Tests: Recent Labs    12/17/17 1827  TSH 0.059*   Urine analysis:    Component Value Date/Time   COLORURINE STRAW (A) 09/17/2015 0001   APPEARANCEUR HAZY (A) 09/17/2015 0001   APPEARANCEUR Cloudy (A) 07/03/2015 1530   LABSPEC 1.010 09/17/2015 0001   PHURINE 5.0 09/17/2015 0001   GLUCOSEU >1000 (A) 09/17/2015 0001   HGBUR NEGATIVE 09/17/2015 0001   BILIRUBINUR NEGATIVE 09/17/2015 0001   BILIRUBINUR Negative 07/03/2015 1530   KETONESUR NEGATIVE 09/17/2015 0001   PROTEINUR NEGATIVE 09/17/2015 0001   UROBILINOGEN 0.2 02/15/2015 1335   NITRITE NEGATIVE 09/17/2015 0001   LEUKOCYTESUR NEGATIVE 09/17/2015 0001   LEUKOCYTESUR 1+ (A) 07/03/2015 1530   Radiology Studies: No results found.  Scheduled Meds: . amiodarone  200 mg Oral Daily  . apixaban  5 mg Oral BID  . atorvastatin  80 mg Oral q1800  . carvedilol  25 mg Oral BID WC  . cholecalciferol  2,000 Units Oral Daily  . clopidogrel   75 mg Oral Daily  . diltiazem  360 mg Oral Daily  . furosemide  40 mg Intravenous Q12H  . insulin aspart  0-9 Units Subcutaneous TID WC  . insulin detemir  7 Units Subcutaneous QHS  . levothyroxine  150 mcg Oral QAC breakfast  . losartan  50 mg Oral Daily  . pantoprazole  40 mg Oral Daily  . [START ON 12/20/2017] potassium chloride SA  40 mEq Oral Daily  . rOPINIRole  0.5 mg Oral QHS  . sodium chloride flush  3 mL Intravenous Q12H   Continuous Infusions: . sodium chloride       LOS: 2 days    Time spent: 30 minutes   Barton Dubois, MD [facility] Pager [phone]  If 7PM-7AM, please contact night-coverage [email or web address] Password [facility] 12/19/2017, 3:19 PM

## 2017-12-20 ENCOUNTER — Inpatient Hospital Stay (HOSPITAL_COMMUNITY): Payer: PPO

## 2017-12-20 LAB — GLUCOSE, CAPILLARY
GLUCOSE-CAPILLARY: 267 mg/dL — AB (ref 70–99)
Glucose-Capillary: 183 mg/dL — ABNORMAL HIGH (ref 70–99)
Glucose-Capillary: 273 mg/dL — ABNORMAL HIGH (ref 70–99)
Glucose-Capillary: 304 mg/dL — ABNORMAL HIGH (ref 70–99)

## 2017-12-20 LAB — BASIC METABOLIC PANEL
ANION GAP: 7 (ref 5–15)
BUN: 15 mg/dL (ref 8–23)
CHLORIDE: 102 mmol/L (ref 98–111)
CO2: 30 mmol/L (ref 22–32)
Calcium: 8.7 mg/dL — ABNORMAL LOW (ref 8.9–10.3)
Creatinine, Ser: 1.01 mg/dL — ABNORMAL HIGH (ref 0.44–1.00)
GFR calc Af Amer: >60 mL/min (ref >60)
GFR calc non Af Amer: 54 mL/min — ABNORMAL LOW (ref >60)
GLUCOSE: 190 mg/dL — AB (ref 70–99)
POTASSIUM: 3.8 mmol/L (ref 3.5–5.1)
SODIUM: 139 mmol/L (ref 135–145)

## 2017-12-20 NOTE — Progress Notes (Signed)
PROGRESS NOTE    Virginia Huber  BDZ:[id number] DOB: July 08, 1945 DOA: 12/17/2017 PCP: Sharilyn Sites, MD   Brief Narrative:  72 y.o. female with PMH significant for tachy-brady syndrome, recent cardioversion, diastolic HF, PAF (chronically on Eliquis), overweight, GERD, hypothyroidism and type 2 diabetes; who came to ED complaining of SOB and feeling congested since Wednesday evening. Patient had cardioversion on that day, everything uneventful, but with subsequent progression of SOB. She reported dry non productive cough, expressed medication compliance and denies hemoptysis, CP, nausea, vomiting, abd pain, HA's, focal weakness, fever, chills, sick contacts or any other complaints.  Of note; she expressed no using salt, but is no following sodium intake.  Assessment & Plan: 1-acute on chronic diastolic heart failure -Continue monitoring on telemetry -Patient diuresing appropriately -Continue IV Lasix for another 24 hours; anticipate transition to PO regimen and discharge tomorrow. -Follow daily weights, strict intake and output and low-sodium diet. -Troponin has remained stable and patient denies chest pain. -repeat CXR in am -cardiology consulted, rec's pending -since admission approx 8.5 L out -CXR demonstrated improvement in aeration and congestion; just mild pleural effusion seen.  2-mixed hyperlipidemia -Continue statins  3-essential hypertension -Blood pressure stable and well-controlled -Continue current antihypertensive regimen possible her vital signs.  4-hypothyroidism -TSH slightly low -continue Synthroid; dose adjusted to 128mcg daily.  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   7-GERD -continue PPI  8-CKD stage 3 -Cr has remained stable and at baseline -continue to monitor trend    -electrolytes stable  9-tachy-brady syndrome/PAF -status post cardioversion on 12/15/17 -has remained on sinus rhythm  -rate well controlled -continue Pacerone, Cardizem and b-blocker -continue eliquis -monitoring on telemetry  -CHADsVASC score 4 -continue outpatient follow up with EP   DVT prophylaxis: eliquis  Code Status: Full code Family Communication: Husband at bedside Disposition Plan: Remains inpatient, continue IV diuresis, follow clinical improvement; discharge home once respiratory status at baseline.  Consultants:   Cardiology   Procedures:   See below for x-ray reports.  Antimicrobials:  Anti-infectives (From admission, onward)   None     Subjective: Feeling much better; no CP, no palpitations. Patient reported just mild orthopnea, but overall improvement in her breathing. Continue to have good urine output.  Objective: Vitals:   12/20/17 0531 12/20/17 1349 12/20/17 1956 12/20/17 2106  BP: (!) 161/50 (!) 135/54  (!) 156/58  Pulse: (!) 59 (!) 59  60  Resp: 19 16  18   Temp: 98.4 F (36.9 C)   98.1 F (36.7 C)  TempSrc: Oral   Oral  SpO2: 97% 94% 94% 94%  Weight:      Height:        Intake/Output Summary (Last 24 hours) at 12/20/2017 2238 Last data filed at 12/20/2017 2120 Gross per 24 hour  Intake 240 ml  Output 2350 ml  Net -2110 ml   Filed Weights   12/18/17 0400 12/19/17 0500 12/20/17 0500  Weight: 70.7 kg (155 lb 13.8 oz) 69.7 kg (153 lb 9 oz) 69.9 kg (154 lb 3 oz)    Examination: General exam: Alert, awake, oriented x 3; feeling better and breathing easier. Just mild orthopnea reported today. Respiratory system: good air movement, no frank crackles.  Cardiovascular system:RRR. No murmurs, rubs, gallops. Gastrointestinal system: Abdomen is nondistended, soft and nontender. No organomegaly or masses felt. Normal bowel sounds heard. Central  nervous system: Alert and oriented. No focal neurological deficits. Extremities: No edema, no  cyanosis, no clubbing. Skin: No rashes, lesions or ulcers Psychiatry: Judgement and insight appear normal. Mood & affect appropriate.    Data Reviewed: I have personally reviewed following labs and imaging studies  CBC: Recent Labs  Lab 12/14/17 1316 12/17/17 1035  WBC 9.1 12.1*  NEUTROABS  --  10.3*  HGB 11.8 10.9*  HCT 36.6 35.5*  MCV 91 92.0  PLT 259 428   Basic Metabolic Panel: Recent Labs  Lab 12/14/17 1316 12/17/17 1035 12/18/17 0635 12/19/17 0620 12/20/17 0551  NA 143 138 140 141 139  K 4.3 3.5 3.2* 3.2* 3.8  CL 104 104 103 104 102  CO2 23 25 28 29 30   GLUCOSE 109* 143* 143* 186* 190*  BUN 14 14 14 15 15   CREATININE 1.15* 1.06* 1.04* 1.03* 1.01*  CALCIUM 9.2 8.5* 8.1* 8.4* 8.7*   GFR: Estimated Creatinine Clearance: 47.2 mL/min (A) (by C-G formula based on SCr of 1.01 mg/dL (H)).   Liver Function Tests: Recent Labs  Lab 12/17/17 1043  AST 58*  ALT 107*  ALKPHOS 65  BILITOT 1.2  PROT 6.3*  ALBUMIN 3.4*   Cardiac Enzymes: Recent Labs  Lab 12/17/17 1035 12/17/17 1827 12/18/17 0016 12/18/17 0635  TROPONINI <0.03 <0.03 <0.03 <0.03   CBG: Recent Labs  Lab 12/19/17 2102 12/20/17 0718 12/20/17 1132 12/20/17 1601 12/20/17 2102  GLUCAP 293* 183* 273* 267* 304*   Urine analysis:    Component Value Date/Time   COLORURINE STRAW (A) 09/17/2015 0001   APPEARANCEUR HAZY (A) 09/17/2015 0001   APPEARANCEUR Cloudy (A) 07/03/2015 1530   LABSPEC 1.010 09/17/2015 0001   PHURINE 5.0 09/17/2015 0001   GLUCOSEU >1000 (A) 09/17/2015 0001   HGBUR NEGATIVE 09/17/2015 0001   BILIRUBINUR NEGATIVE 09/17/2015 0001   BILIRUBINUR Negative 07/03/2015 1530   KETONESUR NEGATIVE 09/17/2015 0001   PROTEINUR NEGATIVE 09/17/2015 0001   UROBILINOGEN 0.2 02/15/2015 1335   NITRITE NEGATIVE 09/17/2015 0001   LEUKOCYTESUR NEGATIVE 09/17/2015 0001   LEUKOCYTESUR 1+ (A) 07/03/2015 1530   Radiology Studies: Dg Chest 2 View  Result Date: 12/20/2017 CLINICAL DATA:   Shortness of breath. EXAM: CHEST - 2 VIEW COMPARISON:  Radiographs 12/17/2017 and 11/25/2017. FINDINGS: Left subclavian pacemaker leads appear unchanged within the right atrium and right ventricle. The heart size and mediastinal contours are stable. There is interval improved aeration of the lung bases with mild residual patchy left lower lobe airspace disease and small bilateral pleural effusions. There is no pneumothorax. The bones appear unchanged with mild thoracic spine degenerative changes. IMPRESSION: Improving bibasilar airspace opacities with persistent small pleural effusions. Findings may reflect resolving pneumonitis or edema. Electronically Signed   By: Richardean Sale M.D.   On: 12/20/2017 15:06    Scheduled Meds: . amiodarone  200 mg Oral Daily  . apixaban  5 mg Oral BID  . atorvastatin  80 mg Oral q1800  . carvedilol  25 mg Oral BID WC  . cholecalciferol  2,000 Units Oral Daily  . clopidogrel  75 mg Oral Daily  . diltiazem  360 mg Oral Daily  . furosemide  40 mg Intravenous Q12H  . insulin aspart  0-9 Units Subcutaneous TID WC  . insulin detemir  7 Units Subcutaneous QHS  . levothyroxine  150 mcg Oral QAC breakfast  . losartan  50 mg Oral Daily  . pantoprazole  40 mg Oral Daily  . potassium chloride SA  40 mEq Oral Daily  .  rOPINIRole  0.5 mg Oral QHS  . sodium chloride flush  3 mL Intravenous Q12H   Continuous Infusions: . sodium chloride       LOS: 3 days    Time spent: 30 minutes   Barton Dubois, MD [facility] Pager [phone]  If 7PM-7AM, please contact night-coverage [email or web address] Password [facility] 12/20/2017, 10:38 PM

## 2017-12-20 NOTE — Care Management Important Message (Signed)
Important Message  Patient Details  Name: ALAZIA CROCKET MRN: [id number] Date of Birth: 1946-05-03   Medicare Important Message Given:  Yes    Shelda Altes 12/20/2017, 11:57 AM

## 2017-12-20 NOTE — Care Management Note (Signed)
Case Management Note  Patient Details  Name: Virginia Huber MRN: [id number] Date of Birth: 1945-09-23  Subjective/Objective:  Adm with CHF. From home with husband. Has RW if needed. Has had [facility] in the past. Ind with ADL's.  2 admission and 1 ER visit in 6 months. She is open to [facility] referral. She reports she has had phone calls in the past.  Currently on room air.                   Action/Plan: DC home. [facility] referral.    Expected Discharge Date:    12/21/2017              Expected Discharge Plan:  Home/Self Care  In-House Referral:     Discharge planning Services  CM Consult  Post Acute Care Choice:  NA Choice offered to:  NA  DME Arranged:    DME Agency:     HH Arranged:    HH Agency:     Status of Service:  Completed, signed off  If discussed at [employer] of Stay Meetings, dates discussed:    Additional Comments:  Guila Owensby, Chauncey Reading, RN 12/20/2017, 2:29 PM

## 2017-12-20 NOTE — Progress Notes (Signed)
Patient has taken oxygen off and her O2 Sats are 96-98% on room air with no complaints of shortness of breath. Patient walks the hall with nursing staff and sats are between 90-93% on room air.

## 2017-12-20 NOTE — Progress Notes (Signed)
Inpatient Diabetes Program Recommendations  AACE/ADA: New Consensus Statement on Inpatient Glycemic Control (2015)  Target Ranges:  Prepandial:   less than 140 mg/dL      Peak postprandial:   less than 180 mg/dL (1-2 hours)      Critically ill patients:  140 - 180 mg/dL   Results for Virginia Huber, Virginia Huber (MRN [id number]) as of 12/20/2017 10:23  Ref. Range 12/19/2017 07:37 12/19/2017 11:43 12/19/2017 16:33 12/19/2017 21:02 12/20/2017 07:18  Glucose-Capillary Latest Ref Range: 70 - 99 mg/dL 175 (H) 314 (H) 202 (H) 293 (H) 183 (H)   Review of Glycemic Control  Diabetes history: DM 2 Outpatient Diabetes medications: 70/30 25 units BID, Metformin 500 mg BID Current orders for Inpatient glycemic control: Levemir 7 units qhs, Novolog 0-9 units tid  Inpatient Diabetes Program Recommendations:    Glucose increases after meals. The 70/30 insulin covers meal at home. Please consider Novolog 4 units tid if patient consumes at least 50% of meals.  A1c 6.8% this admission  Thanks,  Tama Headings RN, MSN, BC-ADM, [facility] Inpatient Diabetes Coordinator Team Pager [phone] (8a-5p)

## 2017-12-20 NOTE — Consult Note (Signed)
   [facility] Inpatient Consult   12/20/2017  Virginia Huber 03/13/1946 [id number]   Received a referral for [facility] services from inpatient case manager. Collaborated with in patient case manager and will follow up with patient in person when at the facility tomorrow.   Rutherford Limerick RN, [facility] Liaison  [phone]) Business Mobile [phone]) Toll free office

## 2017-12-21 ENCOUNTER — Other Ambulatory Visit: Payer: Self-pay | Admitting: Student

## 2017-12-21 DIAGNOSIS — I10 Essential (primary) hypertension: Secondary | ICD-10-CM

## 2017-12-21 DIAGNOSIS — Z955 Presence of coronary angioplasty implant and graft: Secondary | ICD-10-CM

## 2017-12-21 DIAGNOSIS — I495 Sick sinus syndrome: Secondary | ICD-10-CM

## 2017-12-21 DIAGNOSIS — I4892 Unspecified atrial flutter: Secondary | ICD-10-CM

## 2017-12-21 DIAGNOSIS — I5032 Chronic diastolic (congestive) heart failure: Secondary | ICD-10-CM

## 2017-12-21 DIAGNOSIS — I25118 Atherosclerotic heart disease of native coronary artery with other forms of angina pectoris: Secondary | ICD-10-CM

## 2017-12-21 DIAGNOSIS — I5033 Acute on chronic diastolic (congestive) heart failure: Secondary | ICD-10-CM

## 2017-12-21 DIAGNOSIS — Z79899 Other long term (current) drug therapy: Secondary | ICD-10-CM

## 2017-12-21 DIAGNOSIS — E782 Mixed hyperlipidemia: Secondary | ICD-10-CM

## 2017-12-21 DIAGNOSIS — E1159 Type 2 diabetes mellitus with other circulatory complications: Secondary | ICD-10-CM

## 2017-12-21 DIAGNOSIS — N183 Chronic kidney disease, stage 3 (moderate): Secondary | ICD-10-CM

## 2017-12-21 DIAGNOSIS — Z950 Presence of cardiac pacemaker: Secondary | ICD-10-CM

## 2017-12-21 LAB — BASIC METABOLIC PANEL
ANION GAP: 8 (ref 5–15)
BUN: 19 mg/dL (ref 8–23)
CALCIUM: 8.8 mg/dL — AB (ref 8.9–10.3)
CO2: 30 mmol/L (ref 22–32)
CREATININE: 1.04 mg/dL — AB (ref 0.44–1.00)
Chloride: 103 mmol/L (ref 98–111)
GFR calc Af Amer: >60 mL/min (ref >60)
GFR, EST NON AFRICAN AMERICAN: 52 mL/min — AB (ref >60)
GLUCOSE: 238 mg/dL — AB (ref 70–99)
Potassium: 3.8 mmol/L (ref 3.5–5.1)
Sodium: 141 mmol/L (ref 135–145)

## 2017-12-21 LAB — GLUCOSE, CAPILLARY
Glucose-Capillary: 238 mg/dL — ABNORMAL HIGH (ref 70–99)
Glucose-Capillary: 296 mg/dL — ABNORMAL HIGH (ref 70–99)

## 2017-12-21 MED ORDER — LEVOTHYROXINE SODIUM 150 MCG PO TABS
150.0000 ug | ORAL_TABLET | Freq: Every day | ORAL | 1 refills | Status: DC
Start: 2017-12-21 — End: 2019-02-14

## 2017-12-21 MED ORDER — FUROSEMIDE 40 MG PO TABS: 40.0000 mg | ORAL_TABLET | Freq: Two times a day (BID) | ORAL | 3 refills | Status: DC

## 2017-12-21 MED ORDER — POTASSIUM CHLORIDE CRYS ER 20 MEQ PO TBCR: 40.0000 meq | EXTENDED_RELEASE_TABLET | Freq: Every day | ORAL | 1 refills | Status: DC

## 2017-12-21 MED ORDER — LOSARTAN POTASSIUM 50 MG PO TABS: 50.0000 mg | ORAL_TABLET | Freq: Every day | ORAL | Status: DC

## 2017-12-21 NOTE — Progress Notes (Signed)
Patient discharged home with IV removed and site intact. Patient discharged with personal belongings and prescriptions.  

## 2017-12-21 NOTE — Discharge Summary (Signed)
Physician Discharge Summary  Virginia Huber YSA:[id number] DOB: [birth year] DOA: 12/17/2017  PCP: Sharilyn Sites, MD  Admit date: 12/17/2017 Discharge date: 12/21/2017  Time spent: 35 minutes  Recommendations for Outpatient Follow-up:  1. Repeat basic metabolic panel to follow electrolytes and renal function 2. Reassess blood pressure and further adjust antihypertensive treatment as needed. 3. Repeat thyroid panel in 6 weeks and further adjust Synthroid as needed.   Discharge Diagnoses:  Principal Problem:   Acute on chronic diastolic HF (heart failure) (HCC) Active Problems:   Mixed hyperlipidemia   Essential hypertension   Hypothyroidism   DM type 2 causing vascular disease (HCC)   OSA (obstructive sleep apnea)   Tachy-brady syndrome (HCC)   CKD (chronic kidney disease) stage 3, GFR 30-59 ml/min (HCC)   GERD (gastroesophageal reflux disease)   Discharge Condition: Stable and improved.  Patient has been discharged home with instruction to follow-up with PCP and cardiology as an outpatient.  No need for oxygen supplementation found at the patient volume status back to her baseline at discharge.  Diet recommendation: Healthy/low-sodium diet and modify carbohydrates.  Filed Weights   12/19/17 0500 12/20/17 0500 12/21/17 0634  Weight: 69.7 kg (153 lb 9 oz) 69.9 kg (154 lb 3 oz) 68.8 kg (151 lb 10.8 oz)    History of present illness:  72 y.o.femalewith PMH significant for tachy-brady syndrome, recent cardioversion, diastolic HF, PAF (chronically on Eliquis), overweight, GERD, hypothyroidism and type 2 diabetes; who came to ED complaining of SOB and feeling congested since Wednesday evening. Patient had cardioversion on that day, everything uneventful, but with subsequent progression of SOB. She reported dry non productive cough, expressed medication compliance and denies hemoptysis, CP, nausea, vomiting, abd pain, HA's, focal weakness, fever, chills, sick contacts or any other  complaints.  Of note; she expressed no using salt, but was not tracking her sodium intake.  Hospital Course:  1-acute on chronic diastolic heart failure -will discharge home with adjusted dose of lasix as recommended by cardiology service.  -patient with excellent diuresis (approx 8 L ). -Follow daily weights and low sodium diet. -Troponin has remained stable and patient denies chest pain. -repeat CXR demonstrated improvement in aeration and congestion; just mild pleural effusion seen.  2-mixed hyperlipidemia -Continue statins  3-essential hypertension -Blood pressure stable and well-controlled -Continue current antihypertensive regimen and follow VS.  4-hypothyroidism -TSH slightly low during this admission. -continue Synthroid; dose adjusted to 157mcg daily. -will recommend repeat thyroid panel in 6 weeks.  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  6-OSA -not on CPAP -no need for oxygen after volume corrected. -continue follow up as an outpatient.   7-GERD -continue PPI  8-CKD stage 3 -Cr has remained stable and at baseline -continue to monitor trend closely.  9-tachy-brady syndrome/PAF -status post cardioversion on 12/15/17 -has remained on sinus rhythm  -rate well controlled -continue Pacerone, Cardizem and b-blocker -continue eliquis -CHADsVASC score 4 -continue outpatient follow up with EP  Procedures:  See below for x-ray reports.  Consultations:  Cardiology service.  Discharge Exam: Vitals:   12/20/17 2106 12/21/17 0608  BP: (!) 156/58 (!) 174/52  Pulse: 60 60  Resp: 18   Temp: 98.1 F (36.7 C) 97.8 F (36.6 C)  SpO2: 94% 99%    General exam: Alert, awake, oriented x 3; feeling much better and breathing a lot easier. No orthopnea and no needing oxygen supplementation. Respiratory system: good air movement, no frank crackles.  Cardiovascular system:RRR. No  murmurs,  rubs, gallops. Gastrointestinal system: Abdomen is nondistended, soft and nontender. No organomegaly or masses felt. Normal bowel sounds heard. Central nervous system: Alert and oriented. No focal neurological deficits. Extremities: No edema, no cyanosis, no clubbing. Skin: No rashes, lesions or ulcers Psychiatry: Judgement and insight appear normal. Mood & affect appropriate.   Discharge Instructions   Discharge Instructions    (HEART FAILURE PATIENTS) Call MD:  Anytime you have any of the following symptoms: 1) 3 pound weight gain in 24 hours or 5 pounds in 1 week 2) shortness of breath, with or without a dry hacking cough 3) swelling in the hands, feet or stomach 4) if you have to sleep on extra pillows at night in order to breathe.   Complete by:  As directed    AMB Referral to [facility]   Complete by:  As directed    Please assign patient for community nurse to engage and evaluate for monthly home visits. Please assign [facility] pharmacist for patient expressed need of medication affordability assistance. PCP office is listed as completing their own transition of care calls. For questions please contact:   Janci Minor RN, [facility] Liaison [phone])   Reason for consult:  post hospital discharge follow up with [facility] Pharmacist   Diagnoses of:   Heart Failure Diabetes     Expected date of contact:  1-3 days (reserved for hospital discharges)   Diet - low sodium heart healthy   Complete by:  As directed    Discharge instructions   Complete by:  As directed    Take medications as prescribed Maintain adequate hydration Follow low-sodium diet (less than 2 g on daily basis). Follow-up with cardiology service as instructed and arrange follow-up with PCP in about 10 days.     Allergies as of 12/21/2017      Reactions   Penicillins Hives   Has patient had a PCN reaction causing immediate rash, facial/tongue/throat swelling, SOB or  lightheadedness with hypotension: Yes Has patient had a PCN reaction causing severe rash involving mucus membranes or skin necrosis: No Has patient had a PCN reaction that required hospitalization No Has patient had a PCN reaction occurring within the last 10 years: No If all of the above answers are "NO", then may proceed with Cephalosporin use.   Percocet [oxycodone-acetaminophen] Nausea And Vomiting      Medication List    TAKE these medications   acetaminophen 325 MG tablet Commonly known as:  TYLENOL Take 650 mg by mouth every 6 (six) hours as needed for headache.   amiodarone 200 MG tablet Commonly known as:  PACERONE Take 1 tablet (200 mg total) by mouth daily.   apixaban 5 MG Tabs tablet Commonly known as:  ELIQUIS TAKE (1) TABLET BY MOUTH TWICE DAILY.   atorvastatin 80 MG tablet Commonly known as:  LIPITOR Take 1 tablet (80 mg total) by mouth daily at 6 PM.   carvedilol 25 MG tablet Commonly known as:  COREG Take 1 tablet (25 mg total) by mouth 2 (two) times daily.   clopidogrel 75 MG tablet Commonly known as:  PLAVIX TAKE ONE (1) TABLET BY MOUTH EVERY DAY   diltiazem 360 MG 24 hr capsule Commonly known as:  CARDIZEM CD Take 1 capsule (360 mg total) by mouth daily.   furosemide 40 MG tablet Commonly known as:  LASIX Take 1 tablet (40 mg total) by mouth 2 (two) times daily. What changed:  how much to take  when to take this   glucose blood test strip Commonly known as:  FREESTYLE LITE USE AS DIRECTED TWICE DAILY.   insulin NPH-regular Human (70-30) 100 UNIT/ML injection Commonly known as:  NOVOLIN 70/30 Inject 40 Units into the skin See admin instructions. Inject 25 units before breakfast and 25 units before supper  - only if pre-meal blood glucose is above 90 mg/dL.   levothyroxine 150 MCG tablet Commonly known as:  SYNTHROID, LEVOTHROID Take 1 tablet (150 mcg total) by mouth daily before breakfast. What changed:    medication strength  how  much to take   losartan 50 MG tablet Commonly known as:  COZAAR Take 1 tablet (50 mg total) by mouth daily. TAKE ONE (1) TABLET EACH DAY What changed:    medication strength  how much to take  how to take this  when to take this   metFORMIN 500 MG tablet Commonly known as:  GLUCOPHAGE TAKE ONE TABLET BY MOUTH TWICE A DAY WITH A MEAL   nitroGLYCERIN 0.4 MG SL tablet Commonly known as:  NITROSTAT Place 1 tablet (0.4 mg total) under the tongue every 5 (five) minutes as needed for chest pain.   potassium chloride SA 20 MEQ tablet Commonly known as:  K-DUR,KLOR-CON Take 2 tablets (40 mEq total) by mouth daily. Start taking on:  12/22/2017 What changed:  See the new instructions.   rOPINIRole 0.5 MG tablet Commonly known as:  REQUIP Take 0.5 mg by mouth at bedtime.   TYLENOL PM EXTRA STRENGTH 25-500 MG Tabs tablet Generic drug:  diphenhydramine-acetaminophen Take 1 tablet by mouth at bedtime as needed (Take as directed).   Vitamin D 2000 units tablet Take 2,000 Units by mouth daily.      Allergies  Allergen Reactions  . Penicillins Hives    Has patient had a PCN reaction causing immediate rash, facial/tongue/throat swelling, SOB or lightheadedness with hypotension: Yes Has patient had a PCN reaction causing severe rash involving mucus membranes or skin necrosis: No Has patient had a PCN reaction that required hospitalization No Has patient had a PCN reaction occurring within the last 10 years: No If all of the above answers are "NO", then may proceed with Cephalosporin use.  [name] Percocet [Oxycodone-Acetaminophen] Nausea And Vomiting   Follow-up Information    Herminio Commons, MD Follow up on 01/18/2018.   Specialty:  Cardiology Why:  Cardiology Hospital Follow-Up on 01/18/2018 at 1:00PM. You will need repeat labs next week and the orders have been entered for these to be performed at [facility] unless your PCP can obatin these in the interim.  Contact  information: [place] [address] [phone]        Sharilyn Sites, MD. Schedule an appointment as soon as possible for a visit in 10 day(s).   Specialty:  Family Medicine Contact information: [address] [place] [address] [phone]           The results of significant diagnostics from this hospitalization (including imaging, microbiology, ancillary and laboratory) are listed below for reference.    Significant Diagnostic Studies: Dg Chest 2 View  Result Date: 12/20/2017 CLINICAL DATA:  Shortness of breath. EXAM: CHEST - 2 VIEW COMPARISON:  Radiographs 12/17/2017 and 11/25/2017. FINDINGS: Left subclavian pacemaker leads appear unchanged within the right atrium and right ventricle. The heart size and mediastinal contours are stable. There is interval improved aeration of the lung bases with mild residual patchy left lower lobe airspace disease  and small bilateral pleural effusions. There is no pneumothorax. The bones appear unchanged with mild thoracic spine degenerative changes. IMPRESSION: Improving bibasilar airspace opacities with persistent small pleural effusions. Findings may reflect resolving pneumonitis or edema. Electronically Signed   By: Richardean Sale M.D.   On: 12/20/2017 15:06   Dg Chest 2 View  Result Date: 12/17/2017 CLINICAL DATA:  Shortness of breath. EXAM: CHEST - 2 VIEW COMPARISON:  11/25/2017. FINDINGS: Cardiac pacer with lead tips over the right atrium right ventricle. Heart size normal. Pulmonary vascularity normal. Diffuse bilateral pulmonary interstitial prominence suggesting pneumonitis. CHF cannot be excluded. Bibasilar atelectasis. Small left pleural effusion. No pneumothorax. IMPRESSION: 1. Cardiac pacer with lead tips over the right atrium right ventricle. Heart size stable. 2. Diffuse bilateral pulmonary interstitial prominence suggesting pneumonitis. CHF cannot be excluded. Bibasilar atelectasis. Small left pleural effusion.  Electronically Signed   By: Marcello Moores  Register   On: 12/17/2017 11:28   Dg Chest Port 1 View  Result Date: 11/25/2017 CLINICAL DATA:  Mid chest pain and shortness of breath. EXAM: PORTABLE CHEST 1 VIEW COMPARISON:  Radiographs 09/28/2017 FINDINGS: Left-sided pacemaker remains in place. Mild cardiomegaly. Vascular congestion without overt pulmonary edema. Resolved left pleural effusion from prior exam. No focal airspace disease or pneumothorax. No acute osseous abnormalities. IMPRESSION: Mild cardiomegaly with vascular congestion. Resolved left pleural effusion from prior exam. Electronically Signed   By: Jeb Levering M.D.   On: 11/25/2017 02:13    Microbiology: No results found for this or any previous visit (from the past 240 hour(s)).   Labs: Basic Metabolic Panel: Recent Labs  Lab 12/17/17 1035 12/18/17 0635 12/19/17 0620 12/20/17 0551 12/21/17 0614  NA 138 140 141 139 141  K 3.5 3.2* 3.2* 3.8 3.8  CL 104 103 104 102 103  CO2 25 28 29 30 30   GLUCOSE 143* 143* 186* 190* 238*  BUN 14 14 15 15 19   CREATININE 1.06* 1.04* 1.03* 1.01* 1.04*  CALCIUM 8.5* 8.1* 8.4* 8.7* 8.8*   Liver Function Tests: Recent Labs  Lab 12/17/17 1043  AST 58*  ALT 107*  ALKPHOS 65  BILITOT 1.2  PROT 6.3*  ALBUMIN 3.4*   CBC: Recent Labs  Lab 12/14/17 1316 12/17/17 1035  WBC 9.1 12.1*  NEUTROABS  --  10.3*  HGB 11.8 10.9*  HCT 36.6 35.5*  MCV 91 92.0  PLT 259 234   Cardiac Enzymes: Recent Labs  Lab 12/17/17 1035 12/17/17 1827 12/18/17 0016 12/18/17 0635  TROPONINI <0.03 <0.03 <0.03 <0.03   BNP: BNP (last 3 results) Recent Labs    09/23/17 1941 11/25/17 0145 12/17/17 1044  BNP 841.0* 264.0* 677.0*   CBG: Recent Labs  Lab 12/20/17 1132 12/20/17 1601 12/20/17 2102 12/21/17 0747 12/21/17 1135  GLUCAP 273* 267* 304* 238* 296*    Signed:  Barton Dubois MD.  [facility] 12/21/2017, 1:05 PM

## 2017-12-21 NOTE — Consult Note (Signed)
[facility] CM Inpatient Consult   12/21/2017  Virginia Huber 04/08/1946 [id number]   Referral received by inpatient case manager for [facility] services and post hospital discharge follow up related to a diagnosis of HF and 2 admits in the last 6 months. Patient was evaluated for community based chronic disease management services with [facility] care Management Program as a benefit of patient's [employer]. Met with the patient and her spouse at the bedside to explain [facility] services. Patient was familiar with the program and stated she was active with [facility] in the past. Patient endorses her primary care provider to be Dr. [name] whose office completes their own transition of care calls.  Patient states she has transportation to all of her appointments. Patient states her medication costs are high especially her insulin and her Eliquis.   Verbal consent received for [facility] services. Patient gave [phone] as the best number to reach her. Patient will receive post hospital discharge calls from [facility] Pharmacist.  [facility] services do not interfere with or replace any services arranged by the inpatient care management team. RNCM left contact information and [facility] literature at the bedside. Made inpatient RNCM aware  [facility] will be following for care management. For additional questions please contact:   Rolanda Campa RN, [facility] Liaison  [phone]) Business Mobile [phone]) Toll free office

## 2017-12-21 NOTE — Consult Note (Addendum)
Cardiology Consult    Patient ID: Virginia Huber; [id number]; 17-Oct-1945   Admit date: 12/17/2017 Date of Consult: 12/21/2017  Primary Care Provider: Sharilyn Sites, MD Primary Cardiologist: Kate Sable, MD  Primary Electrophysiologist:  Dr. Curt Bears  Patient Profile    Virginia Huber is a 72 y.o. female with past medical history of CAD (s/p PTCA of OM1 in 2016, DES to LCx in 2016, DES to RCA in 2017), chronic diastolic CHF, PAF (on Eliquis, s/p recent DCCV on 12/15/2017), tachy-brady syndrome (s/p Medtronic PPM placement in 2018), HTN, HLD, and IDDM who is being seen today for the evaluation of CHF at the request of Dr. Dyann Kief.   History of Present Illness    Virginia Huber was recently evaluated by Dr. Curt Bears on 12/14/2017 and reported worsening weakness and fatigue over the past 2 weeks despite titration of her diuretic regimen at her office visit on 6/13. Her device was interrogated and showed that was around the timeframe during which she went into atrial flutter. An attempt was made to pace her out of atrial flutter during her office visit but was unsuccessful. Therefore, a DCCV was recommended as an outpatient and she was continued on Amiodarone at that time with consideration of switching to Multaq at the time of her next office visit.  She underwent DCCV on 12/15/2017 by Dr. Acie Fredrickson with successful conversion to normal sinus rhythm which was verified after the procedure by device interrogation.  She presented back to [facility] ED on 12/17/2017 for worsening dyspnea following her recent DCCV. Oxygen saturations had been in the 70's by her report. Labs showed WBC 12.1, Hgb 10.9, platelets 234, Na+ 138, K+ 3.5, and creatinine 1.06. Initial and cyclic troponin values have been negative. BNP was elevated to 677. CXR showed diffuse bilateral pulmonary interstitial prominence suggestion pneumonitis but CHF cannot be excluded.  Was noted to have a small left pleural effusion. EKG shows  normal sinus rhythm, heart rate 68, and LVH with nonspecific ST abnormalities along inferior and lateral leads which is similar to prior tracings.   She was admitted and started on IV Lasix 40 mg twice daily for diuresis. Repeat CXR on 12/20/2017 showed improving bibasilar airspace opacities with persistent small pleural effusions which was reflective of resolving edema as compared to prior images. Weight has declined by 6 lbs and she is overall - 5.0 L this admission.  In talking with the patient and her husband today, she reports feeling back to baseline.  Weight has improved to 151 lbs and her baseline is around 150 - 151 lbs on her home scales. Has been ambulating around the room without recurrent dyspnea. Oxygen saturations have remained appropriate on room air. Denies any chest pain or palpitations.  Past Medical History:  Diagnosis Date  . Arthritis   . Atrial fibrillation and flutter ([place])    a. h/o PAF/flutter during admission in 2013 for PNA. b. PAF during adm for NSTEMI 07/2015, subsequent paroxysms since then.  . B12 deficiency anemia   . Cardiac tamponade 06/2016  . Coronary artery disease 11/30/2014   a. remote MI. b. h/o PTCA with scoring balloon to OM1 11/2014. c. NSTEMI 03/2015 s/p DES to prox-mid Cx. d. NSTEMI 07/2015 s/p scoring balloon/PTCA/DES to dRCA with PAF during that admission  . Cutaneous lupus erythematosus   . GERD (gastroesophageal reflux disease)   . Heart block   . History of blood transfusion 1980's   2nd surgical procedures  . HTN (hypertension)   .  Hypercholesteremia   . Hypothyroidism   . Myocardial infarction ([place]) 02/2012  . Ovarian tumor   . PAD (peripheral artery disease) ([place])    a. s/p LE angio 2015; followed by Dr. Fletcher Anon - managed medically.  . Pain with urination 05/08/2015  . Paroxysmal atrial flutter ([place])   . Pericardial effusion    a. 06/2016 after ppm - s/p pericardiocentesis.  . Superficial fungus infection of skin 06/29/2013  . Tachy-brady  syndrome ([place])    a. s/p Medtronic PPM 06/2016, c/b lead perf/pericardial effusion.  [name] TIA (transient ischemic attack) 08/2001; ~ 2006  . Type II diabetes mellitus ([place])   . UTI (urinary tract infection) 05/08/2013    Past Surgical History:  Procedure Laterality Date  . ABDOMINAL AORTAGRAM N/A 01/03/2014   Procedure: ABDOMINAL [name];  Surgeon: Wellington Hampshire, MD;  Location: [place] CATH LAB;  Service: Cardiovascular;  Laterality: N/A;  . ABDOMINAL HYSTERECTOMY  1972   "partial"  . APPENDECTOMY  1970's  . CARDIAC CATHETERIZATION  2008   Tiny OM-2 with 90% narrowing. Med tx.  [name] CARDIAC CATHETERIZATION N/A 11/30/2014   Procedure: Left Heart Cath and Coronary Angiography;  Surgeon: Troy Sine, MD; LAD 20%, CFX 50%, OM1 95%, right PLB 30%, LV normal   . CARDIAC CATHETERIZATION N/A 11/30/2014   Procedure: Coronary Balloon Angioplasty;  Surgeon: Troy Sine, MD;  Angiosculpt scoring balloon and PTCA to the OM1 reducing stenosis from 95% to less than 10%  . CARDIAC CATHETERIZATION N/A 04/03/2015   Procedure: Left Heart Cath and Coronary Angiography;  Surgeon: Jolaine Artist, MD; dLAD 50%, CFX 90%, OM1 100%, PLA 15%, LVEDP 13    . CARDIAC CATHETERIZATION N/A 04/03/2015   Procedure: Coronary Stent Intervention;  Surgeon: Sherren Mocha, MD; 3.0x18 mm Xience DES to the CFX    . CARDIAC CATHETERIZATION N/A 08/02/2015   Procedure: Left Heart Cath and Coronary Angiography;  Surgeon: Troy Sine, MD;  Location: [place] CV LAB;  Service: Cardiovascular;  Laterality: N/A;  . CARDIAC CATHETERIZATION N/A 08/02/2015   Procedure: Coronary Stent Intervention;  Surgeon: Troy Sine, MD;  Location: [place] CV LAB;  Service: Cardiovascular;  Laterality: N/A;  . CARDIAC CATHETERIZATION N/A 06/25/2016   Procedure: Pericardiocentesis;  Surgeon: Will Meredith Leeds, MD;  Location: [place] CV LAB;  Service: Cardiovascular;  Laterality: N/A;  . cardiac stents    . CARDIOVERSION N/A 12/15/2017    Procedure: CARDIOVERSION;  Surgeon: Acie Fredrickson Wonda Cheng, MD;  Location: [facility] ENDOSCOPY;  Service: Cardiovascular;  Laterality: N/A;  . CHOLECYSTECTOMY OPEN  1990's  . COLONOSCOPY  2005   Dr. Laural Golden: pancolonic divericula, polyp, path unknown currently  . COLONOSCOPY  2012   Dr. Oneida Alar: Normal TI, scattered diverticula in entire colon, small internal hemorrhoids, normal colon biopsies. Colonoscopy in 5-10 years.   . COLOSTOMY  05/1979  . COLOSTOMY REVERSAL  11/1979  . EP IMPLANTABLE DEVICE N/A 06/25/2016   Procedure: Lead Revision/Repair;  Surgeon: Will Meredith Leeds, MD;  Location: [place] CV LAB;  Service: Cardiovascular;  Laterality: N/A;  . EP IMPLANTABLE DEVICE N/A 06/25/2016   Procedure: Pacemaker Implant;  Surgeon: Will Meredith Leeds, MD;  Location: [place] CV LAB;  Service: Cardiovascular;  Laterality: N/A;  . EXCISIONAL HEMORRHOIDECTOMY  1970's  . EYE SURGERY Left 2000   "branch vein occlusion"  . EYE SURGERY Left ~ 2001   "smoothed out wrinkle"  . LEFT OOPHORECTOMY  05/1979   nicked bowel, peritonitis, colostomy; colostomy reversed 1981   . LOWER  EXTREMITY ANGIOGRAM N/A 01/03/2014   Procedure: LOWER EXTREMITY ANGIOGRAM;  Surgeon: Wellington Hampshire, MD;  Location: [place] CATH LAB;  Service: Cardiovascular;  Laterality: N/A;  . Nuclear med stress test  10/2011   Small area of mild ischemia inferoapically.  [name] PARTIAL HYSTERECTOMY  1970's   left ovaries, then ovaries removed later due tumors   . RIGHT OOPHORECTOMY  1970's     Home Medications:  Prior to Admission medications   Medication Sig Start Date End Date Taking? Authorizing Provider  acetaminophen (TYLENOL) 325 MG tablet Take 650 mg by mouth every 6 (six) hours as needed for headache.   Yes [provider]  amiodarone (PACERONE) 200 MG tablet Take 1 tablet (200 mg total) by mouth daily. 07/06/16  Yes Camnitz, Will Hassell Done, MD  apixaban (ELIQUIS) 5 MG TABS tablet TAKE (1) TABLET BY MOUTH TWICE DAILY. 08/27/17  Yes  Herminio Commons, MD  atorvastatin (LIPITOR) 80 MG tablet Take 1 tablet (80 mg total) by mouth daily at 6 PM. 04/27/16  Yes Herminio Commons, MD  carvedilol (COREG) 25 MG tablet Take 1 tablet (25 mg total) by mouth 2 (two) times daily. 07/15/17 12/17/17 Yes Camnitz, Will Hassell Done, MD  Cholecalciferol (VITAMIN D) 2000 units tablet Take 2,000 Units by mouth daily.    Yes [provider]  clopidogrel (PLAVIX) 75 MG tablet TAKE ONE (1) TABLET BY MOUTH EVERY DAY 09/07/17  Yes Herminio Commons, MD  diltiazem (CARDIZEM CD) 360 MG 24 hr capsule Take 1 capsule (360 mg total) by mouth daily. 08/09/17  Yes Herminio Commons, MD  diphenhydramine-acetaminophen (TYLENOL PM EXTRA STRENGTH) 25-500 MG TABS tablet Take 1 tablet by mouth at bedtime as needed (Take as directed).    Yes [provider]  furosemide (LASIX) 40 MG tablet Take 1.5 tablets (60 mg total) by mouth daily. 09/30/17  Yes Memon, Jolaine Artist, MD  glucose blood (FREESTYLE LITE) test strip USE AS DIRECTED TWICE DAILY. 05/05/17  Yes Nida, Marella Chimes, MD  insulin NPH-regular Human (NOVOLIN 70/30) (70-30) 100 UNIT/ML injection Inject 40 Units into the skin See admin instructions. Inject 25 units before breakfast and 25 units before supper  - only if pre-meal blood glucose is above 90 mg/dL. 09/30/17  Yes Kathie Dike, MD  levothyroxine (SYNTHROID, LEVOTHROID) 175 MCG tablet Take 175 mcg by mouth daily before breakfast.   Yes [provider]  losartan (COZAAR) 100 MG tablet TAKE ONE (1) TABLET EACH DAY 06/17/17  Yes Herminio Commons, MD  metFORMIN (GLUCOPHAGE) 500 MG tablet TAKE ONE TABLET BY MOUTH TWICE A DAY WITH A MEAL 08/26/17  Yes Nida, Marella Chimes, MD  nitroGLYCERIN (NITROSTAT) 0.4 MG SL tablet Place 1 tablet (0.4 mg total) under the tongue every 5 (five) minutes as needed for chest pain. 11/25/17  Yes Rolland Porter, MD  potassium chloride SA (K-DUR,KLOR-CON) 20 MEQ tablet TAKE ONE (1) TABLET BY MOUTH EVERY DAY  12/01/17  Yes Herminio Commons, MD  rOPINIRole (REQUIP) 0.5 MG tablet Take 0.5 mg by mouth at bedtime.  03/05/14  Yes [provider]    Inpatient Medications: Scheduled Meds: . amiodarone  200 mg Oral Daily  . apixaban  5 mg Oral BID  . atorvastatin  80 mg Oral q1800  . carvedilol  25 mg Oral BID WC  . cholecalciferol  2,000 Units Oral Daily  . clopidogrel  75 mg Oral Daily  . diltiazem  360 mg Oral Daily  . furosemide  40 mg Intravenous Q12H  .  insulin aspart  0-9 Units Subcutaneous TID WC  . insulin detemir  7 Units Subcutaneous QHS  . levothyroxine  150 mcg Oral QAC breakfast  . losartan  50 mg Oral Daily  . pantoprazole  40 mg Oral Daily  . potassium chloride SA  40 mEq Oral Daily  . rOPINIRole  0.5 mg Oral QHS  . sodium chloride flush  3 mL Intravenous Q12H   Continuous Infusions: . sodium chloride     PRN Meds: sodium chloride, acetaminophen, ondansetron (ZOFRAN) IV, sodium chloride flush, traZODone  Allergies:    Allergies  Allergen Reactions  . Penicillins Hives    Has patient had a PCN reaction causing immediate rash, facial/tongue/throat swelling, SOB or lightheadedness with hypotension: Yes Has patient had a PCN reaction causing severe rash involving mucus membranes or skin necrosis: No Has patient had a PCN reaction that required hospitalization No Has patient had a PCN reaction occurring within the last 10 years: No If all of the above answers are "NO", then may proceed with Cephalosporin use.  [name] Percocet [Oxycodone-Acetaminophen] Nausea And Vomiting    Social History:   Social History   Socioeconomic History  . Marital status: Married    Spouse name: Not on file  . Number of children: Not on file  . Years of education: Not on file  . Highest education level: Not on file  Occupational History  . Occupation: Retired    Fish farm manager: RETIRED    Comment: Research officer, political party  Social Needs  . Financial resource strain: Not on file  . Food  insecurity:    Worry: Not on file    Inability: Not on file  . Transportation needs:    Medical: Not on file    Non-medical: Not on file  Tobacco Use  . Smoking status: Never Smoker  . Smokeless tobacco: Never Used  . Tobacco comment: Never smoked  Substance and Sexual Activity  . Alcohol use: No    Alcohol/week: 0.0 oz  . Drug use: No  . Sexual activity: Never    Birth control/protection: Surgical    Comment: hyst  Lifestyle  . Physical activity:    Days per week: Not on file    Minutes per session: Not on file  . Stress: Not on file  Relationships  . Social connections:    Talks on phone: Not on file    Gets together: Not on file    Attends religious service: Not on file    Active member of club or organization: Not on file    Attends meetings of clubs or organizations: Not on file    Relationship status: Not on file  . Intimate partner violence:    Fear of current or ex partner: Not on file    Emotionally abused: Not on file    Physically abused: Not on file    Forced sexual activity: Not on file  Other Topics Concern  . Not on file  Social History Narrative  . Not on file     Family History:    Family History  Problem Relation Age of Onset  . Heart disease Mother        deceased  . Heart disease Father        deceased, heart disease  . Diabetes Brother   . Heart disease Brother   . Thyroid disease Brother   . Heart disease Sister   . Heart disease Brother   . Thyroid disease Brother   . Lupus Daughter   .  Colon cancer Neg Hx       Review of Systems    General:  No chills, fever, night sweats or weight changes.  Cardiovascular:  No chest pain, edema,  palpitations, paroxysmal nocturnal dyspnea. Positive for dyspnea on exertion and orthopnea.  Dermatological: No rash, lesions/masses Respiratory: No cough, dyspnea Urologic: No hematuria, dysuria Abdominal:   No nausea, vomiting, diarrhea, bright red blood per rectum, melena, or  hematemesis Neurologic:  No visual changes, wkns, changes in mental status. All other systems reviewed and are otherwise negative except as noted above.  Physical Exam/Data    Vitals:   12/20/17 1956 12/20/17 2106 12/21/17 0608 12/21/17 0634  BP:  (!) 156/58 (!) 174/52   Pulse:  60 60   Resp:  18    Temp:  98.1 F (36.7 C) 97.8 F (36.6 C)   TempSrc:  Oral Oral   SpO2: 94% 94% 99%   Weight:    151 lb 10.8 oz (68.8 kg)  Height:        Intake/Output Summary (Last 24 hours) at 12/21/2017 1028 Last data filed at 12/21/2017 0635 Gross per 24 hour  Intake 240 ml  Output 1500 ml  Net -1260 ml   Filed Weights   12/19/17 0500 12/20/17 0500 12/21/17 0634  Weight: 153 lb 9 oz (69.7 kg) 154 lb 3 oz (69.9 kg) 151 lb 10.8 oz (68.8 kg)   Body mass index is 26.87 kg/m.   General: Pleasant, Caucasian female appearing in NAD Psych: Normal affect. Neuro: Alert and oriented X 3. Moves all extremities spontaneously. HEENT: Normal  Neck: Supple without bruits or JVD. Lungs:  Resp regular and unlabored, CTA without wheezing or rales. Heart: RRR no s3, s4, or murmurs. Abdomen: Soft, non-tender, non-distended, BS + x 4.  Extremities: No clubbing, cyanosis or lower extremity edema. DP/PT/Radials 2+ and equal bilaterally.   EKG:  The EKG was personally reviewed and demonstrates: Normal sinus rhythm, heart rate 68, and LVH with nonspecific ST abnormalities along inferior and lateral leads which is similar to prior tracings.   Labs/Studies     Relevant CV Studies:  Echocardiogram: 07/2017 Study Conclusions  - Left ventricle: The cavity size was normal. Wall thickness was   increased in a pattern of moderate LVH. Systolic function was   normal. The estimated ejection fraction was in the range of 55%   to 60%. Wall motion was normal; there were no regional wall   motion abnormalities. Features are consistent with a pseudonormal   left ventricular filling pattern, with concomitant abnormal    relaxation and increased filling pressure (grade 2 diastolic   dysfunction). Doppler parameters are consistent with high   ventricular filling pressure. - Left atrium: The atrium was severely dilated. - Right ventricle: Pacer wire or catheter noted in right ventricle. - Right atrium: Pacer wire or catheter noted in right atrium.   Laboratory Data:  Chemistry Recent Labs  Lab 12/19/17 0620 12/20/17 0551 12/21/17 0614  NA 141 139 141  K 3.2* 3.8 3.8  CL 104 102 103  CO2 29 30 30   GLUCOSE 186* 190* 238*  BUN 15 15 19   CREATININE 1.03* 1.01* 1.04*  CALCIUM 8.4* 8.7* 8.8*  GFRNONAA 53* 54* 52*  GFRAA >60 >60 >60  ANIONGAP 8 7 8     Recent Labs  Lab 12/17/17 1043  PROT 6.3*  ALBUMIN 3.4*  AST 58*  ALT 107*  ALKPHOS 65  BILITOT 1.2   Hematology Recent Labs  Lab 12/14/17 1316 12/17/17 1035  WBC 9.1 12.1*  RBC 4.04 3.86*  HGB 11.8 10.9*  HCT 36.6 35.5*  MCV 91 92.0  MCH 29.2 28.2  MCHC 32.2 30.7  RDW 16.0* 15.3  PLT 259 234   Cardiac Enzymes Recent Labs  Lab 12/17/17 1035 12/17/17 1827 12/18/17 0016 12/18/17 0635  TROPONINI <0.03 <0.03 <0.03 <0.03   No results for input(s): TROPIPOC in the last 168 hours.  BNP Recent Labs  Lab 12/17/17 1044  BNP 677.0*    DDimer No results for input(s): DDIMER in the last 168 hours.  Radiology/Studies:  Dg Chest 2 View  Result Date: 12/20/2017 CLINICAL DATA:  Shortness of breath. EXAM: CHEST - 2 VIEW COMPARISON:  Radiographs 12/17/2017 and 11/25/2017. FINDINGS: Left subclavian pacemaker leads appear unchanged within the right atrium and right ventricle. The heart size and mediastinal contours are stable. There is interval improved aeration of the lung bases with mild residual patchy left lower lobe airspace disease and small bilateral pleural effusions. There is no pneumothorax. The bones appear unchanged with mild thoracic spine degenerative changes. IMPRESSION: Improving bibasilar airspace opacities with persistent  small pleural effusions. Findings may reflect resolving pneumonitis or edema. Electronically Signed   By: Richardean Sale M.D.   On: 12/20/2017 15:06   Dg Chest 2 View  Result Date: 12/17/2017 CLINICAL DATA:  Shortness of breath. EXAM: CHEST - 2 VIEW COMPARISON:  11/25/2017. FINDINGS: Cardiac pacer with lead tips over the right atrium right ventricle. Heart size normal. Pulmonary vascularity normal. Diffuse bilateral pulmonary interstitial prominence suggesting pneumonitis. CHF cannot be excluded. Bibasilar atelectasis. Small left pleural effusion. No pneumothorax. IMPRESSION: 1. Cardiac pacer with lead tips over the right atrium right ventricle. Heart size stable. 2. Diffuse bilateral pulmonary interstitial prominence suggesting pneumonitis. CHF cannot be excluded. Bibasilar atelectasis. Small left pleural effusion. Electronically Signed   By: Marcello Moores  Register   On: 12/17/2017 11:28     Assessment & Plan    1. Acute on Chronic Diastolic CHF - Recent echocardiogram in 07/2017 showed a preserved EF of 55 to 60% with grade 2 diastolic dysfunction. She presented with worsening dyspnea and orthopnea which started the evening following her DCCV. She reports oxygen saturations were in the 70's the following day which prompted her to come to the ED for further evaluation. BNP was elevated to 677 and CXR was consistent with CHF. - she has diuresed well with IV Lasix 40 mg twice daily as weight has declined by 6 lbs (currently 151 lbs) and she is overall - 5.0 L this admission. She was on Lasix 60 mg daily as an outpatient and was taking an extra tablet as needed for worsening symptoms. Would recommend titration to 40 mg twice daily with associated increase of K-dur from 20 mEq daily to 40 mEq daily. Would need a repeat BMET in 1 week to assess kidney function and K+ levels (creatinine stable at 1.04 this AM with K+ at 3.8).   2. Paroxysmal Atrial Flutter - s/p recent DCCV on 12/15/2017. Maintaining NSR this  admission. - remains on Amiodarone 200mg  daily, Cardizem CD 360mg  daily, and Coreg 25mg  BID. - denies any evidence of active bleeding. Continue Eliquis for anticoagulation.   3. Tachy-Brady Syndrome - s/p Medtronic PPM placement in 06/2016. Followed by Dr. Curt Bears.   4. CAD - s/p PTCA of OM1 in 2016, DES to LCx in 2016, DES to RCA in 2017. - she denies any recent chest pain. Dyspnea has improved with diuresis.  - EKG this admission shows no acute ischemic  changes and cyclic troponin values have been negative. No indication for further ischemic testing at this time.   5. HTN - BP has been variable at 135/52 - 174/58 within the past 24 hours. Has not been rechecked since receiving her AM medications. BP was well controlled at 130/78 at the time of her office visit on 12/14/2017. Would continue to follow in the outpatient setting and titrate medications if needed.    For questions or updates, please contact [place] Please consult [email or web address] for contact info under Cardiology/STEMI.  Signed, Erma Heritage, PA-C 12/21/2017, 10:28 AM Pager: [phone]  The patient was seen and examined, and I agree with the history, physical exam, assessment and plan as documented above, with modifications as noted below. I have also personally reviewed all relevant documentation, old records, labs, and both radiographic and cardiovascular studies. I have also independently interpreted old and new ECG's.  Briefly, this is a 72 year old woman who is well-known to me from the outpatient setting.  She has a history of coronary artery disease with multivessel PCI, paroxysmal atrial fibrillation, chronic diastolic heart failure, and tachycardia-bradycardia syndrome with pacemaker implantation in 2018.  She recently underwent direct-current cardioversion for atrial flutter.  She is currently on amiodarone with the thought of perhaps switching to Multaq at the time of her next visit with EP.  She has  been hospitalized for progressive shortness of breath/acute on chronic diastolic heart failure.  She has been on IV Lasix 40 mg twice daily with approximately 5 L of output.  She is feeling well today and she is at her baseline weight.  She has been maintaining sinus rhythm.  She and her husband do not cook with salt nor add salt to their foods.  She enjoys hot dogs but does not eat them regularly. She had questions about how to quantify sodium intake and we had a long discussion regarding this.  I also spoke with Dr. Dyann Kief.  I recommend increasing outpatient Lasix to 40 mg twice daily with a basic metabolic panel to be checked within the next several days.  We will arrange for close outpatient follow-up.  She may benefit from atrial flutter ablation but I will defer to EP regarding this.  She is stable for discharge.   Kate Sable, MD, [facility]  12/21/2017 11:42 AM

## 2017-12-22 ENCOUNTER — Other Ambulatory Visit: Payer: Self-pay

## 2017-12-22 NOTE — Patient Outreach (Signed)
[facility]) Care Management  12/22/2017   TAELAR GRONEWOLD [birth year] [id number]  Subjective:  RN CM contacted member to complete initial telephonic assessment. Outreach successful. HIPAA verifiers obtained.  Objective:   Current Medications:  Current Outpatient Medications  Medication Sig Dispense Refill  . acetaminophen (TYLENOL) 325 MG tablet Take 650 mg by mouth every 6 (six) hours as needed for headache.    [name] amiodarone (PACERONE) 200 MG tablet Take 1 tablet (200 mg total) by mouth daily. 30 tablet 6  . apixaban (ELIQUIS) 5 MG TABS tablet TAKE (1) TABLET BY MOUTH TWICE DAILY. 180 tablet 3  . atorvastatin (LIPITOR) 80 MG tablet Take 1 tablet (80 mg total) by mouth daily at 6 PM. 30 tablet 6  . carvedilol (COREG) 25 MG tablet Take 1 tablet (25 mg total) by mouth 2 (two) times daily. 180 tablet 3  . carvedilol (COREG) 25 MG tablet Take 25 mg by mouth 2 (two) times daily with a meal.    . Cholecalciferol (VITAMIN D) 2000 units tablet Take 2,000 Units by mouth daily.     . clopidogrel (PLAVIX) 75 MG tablet TAKE ONE (1) TABLET BY MOUTH EVERY DAY 90 tablet 3  . diltiazem (CARDIZEM CD) 360 MG 24 hr capsule Take 1 capsule (360 mg total) by mouth daily. 90 capsule 3  . diphenhydramine-acetaminophen (TYLENOL PM EXTRA STRENGTH) 25-500 MG TABS tablet Take 1 tablet by mouth at bedtime as needed (Take as directed).     . furosemide (LASIX) 40 MG tablet Take 1 tablet (40 mg total) by mouth 2 (two) times daily. 60 tablet 3  . glucose blood (FREESTYLE LITE) test strip USE AS DIRECTED TWICE DAILY. 100 each 5  . insulin NPH-regular Human (NOVOLIN 70/30) (70-30) 100 UNIT/ML injection Inject 40 Units into the skin See admin instructions. Inject 25 units before breakfast and 25 units before supper  - only if pre-meal blood glucose is above 90 mg/dL. (Patient taking differently: Inject 25 Units into the skin See admin instructions. Inject 25 units before breakfast and 25 units before supper   - only if pre-meal blood glucose is above 90 mg/dL.) 10 mL 11  . levothyroxine (SYNTHROID, LEVOTHROID) 150 MCG tablet Take 1 tablet (150 mcg total) by mouth daily before breakfast. 30 tablet 1  . losartan (COZAAR) 50 MG tablet Take 1 tablet (50 mg total) by mouth daily. TAKE ONE (1) TABLET EACH DAY    . metFORMIN (GLUCOPHAGE) 500 MG tablet TAKE ONE TABLET BY MOUTH TWICE A DAY WITH A MEAL 180 tablet 0  . nitroGLYCERIN (NITROSTAT) 0.4 MG SL tablet Place 1 tablet (0.4 mg total) under the tongue every 5 (five) minutes as needed for chest pain. 25 tablet 0  . potassium chloride SA (K-DUR,KLOR-CON) 20 MEQ tablet Take 2 tablets (40 mEq total) by mouth daily. 60 tablet 1  . rOPINIRole (REQUIP) 0.5 MG tablet Take 0.5 mg by mouth at bedtime.   0   No current facility-administered medications for this visit.     Functional Status:  In your present state of health, do you have any difficulty performing the following activities: 12/17/2017 09/23/2017  Hearing? N N  Vision? N N  Difficulty concentrating or making decisions? N N  Walking or climbing stairs? N N  Dressing or bathing? N N  Doing errands, shopping? N N  Some recent data might be hidden    Fall/Depression Screening: Fall Risk  12/08/2017 08/12/2017 05/10/2017  Falls in the past year? Yes No No  Number falls in past yr: 2 or more - -  Injury with Fall? Yes - -  Risk Factor Category  High Fall Risk - -  Follow up Education provided;Falls prevention discussed - -   PHQ 2/9 Scores 08/12/2017 05/10/2017 02/01/2017 02/01/2017 10/29/2016 10/29/2016 07/28/2016  PHQ - 2 Score 0 0 2 0 0 0 0  PHQ- 9 Score - - 5 - - - -  Exception Documentation - - - - - - -    Assessment:  Telephonic assessment complete. Mrs. Salmons denied complaints of pain or shortness of breath.  Member reported compliance with daily weights and daily glucose monitoring. She reported taking medications as prescribed.  RN CM discussed available [facility] services. Mrs. Waddle reported  that her spouse and daughter were available to assist and provide transportation as needed. Member denied urgent concerns and was agreeable to initial home visit and continued outreach. [facility] contact information provided. Member encouraged to contact RN CM with questions as needed.   [facility] CM Care Plan Problem One     Most Recent Value  Care Plan Problem One  Risk for readmission related to CHF management.   Role Documenting the Problem One  Care Management Coordinator  Care Plan for Problem One  Active  [facility] Long Term Goal   Over the next 45 days patient will not have a hospitalization related to CHF.  [facility] Long Term Goal Start Date  12/22/17  Interventions for Problem One Long Term Goal  RN CM discussed signs and symptoms of complications related to CHF.   [facility] CM Short Term Goal #1   Over the next 30 days patient will complete daily weights and take medications as prescribed.  [facility] CM Short Term Goal #1 Start Date  12/22/17  Interventions for Short Term Goal #1  RN CM discussed indications for contacting MD.       PLAN RN CM will complete Initial home visit on 12/31/17.   [place] [phone]

## 2017-12-22 NOTE — Patient Outreach (Signed)
[facility]) Care Management  [place]   12/22/2017  Virginia Huber [birth year] [id number]   72 year old female referred to [place] Management.  [place] services requested for 30 day post discharge medication review and medication assistance.  PMHx includes, but not limited to, hypertension, peripheral artery disease, h/o NSTEMI, atrial fibrillation, heart failure, GERD, hypothyroidism, CKD stage 3, hypothyroidism, and hyperlipidemia.  Subjective: Virginia Huber reports that she is feeling a little tired after her recent hospitalization.  She states that she is taking her daily weights and reports a weight of 147 lbs today.  Hospital discharge weight of 151 lbs.  She reports that her glucose was high in the hospital, but states the it was 106 today.  She says she is back on her regular dose of insulin and metformin.  She states that she is currently buying the Relion brand of insulin at [facility] for $25/vial to save money.  She says she is paying $45/month for her Eliquis and that she is not in the donut hole yet.    Objective:  HgA1c 6.8% on 6/28  SCr 1.04 on 7/2  Current Medications: Current Outpatient Medications  Medication Sig Dispense Refill  . acetaminophen (TYLENOL) 325 MG tablet Take 650 mg by mouth every 6 (six) hours as needed for headache.    [name] amiodarone (PACERONE) 200 MG tablet Take 1 tablet (200 mg total) by mouth daily. 30 tablet 6  . apixaban (ELIQUIS) 5 MG TABS tablet TAKE (1) TABLET BY MOUTH TWICE DAILY. 180 tablet 3  . atorvastatin (LIPITOR) 80 MG tablet Take 1 tablet (80 mg total) by mouth daily at 6 PM. 30 tablet 6  . carvedilol (COREG) 25 MG tablet Take 25 mg by mouth 2 (two) times daily with a meal.    . Cholecalciferol (VITAMIN D) 2000 units tablet Take 2,000 Units by mouth daily.     . clopidogrel (PLAVIX) 75 MG tablet TAKE ONE (1) TABLET BY MOUTH EVERY DAY 90 tablet 3  . diltiazem (CARDIZEM CD) 360 MG 24 hr capsule Take 1 capsule (360  mg total) by mouth daily. 90 capsule 3  . diphenhydramine-acetaminophen (TYLENOL PM EXTRA STRENGTH) 25-500 MG TABS tablet Take 1 tablet by mouth at bedtime as needed (Take as directed).     . furosemide (LASIX) 40 MG tablet Take 1 tablet (40 mg total) by mouth 2 (two) times daily. 60 tablet 3  . glucose blood (FREESTYLE LITE) test strip USE AS DIRECTED TWICE DAILY. 100 each 5  . insulin NPH-regular Human (NOVOLIN 70/30) (70-30) 100 UNIT/ML injection Inject 40 Units into the skin See admin instructions. Inject 25 units before breakfast and 25 units before supper  - only if pre-meal blood glucose is above 90 mg/dL. (Patient taking differently: Inject 25 Units into the skin See admin instructions. Inject 25 units before breakfast and 25 units before supper  - only if pre-meal blood glucose is above 90 mg/dL.) 10 mL 11  . levothyroxine (SYNTHROID, LEVOTHROID) 150 MCG tablet Take 1 tablet (150 mcg total) by mouth daily before breakfast. 30 tablet 1  . losartan (COZAAR) 50 MG tablet Take 1 tablet (50 mg total) by mouth daily. TAKE ONE (1) TABLET EACH DAY    . metFORMIN (GLUCOPHAGE) 500 MG tablet TAKE ONE TABLET BY MOUTH TWICE A DAY WITH A MEAL 180 tablet 0  . nitroGLYCERIN (NITROSTAT) 0.4 MG SL tablet Place 1 tablet (0.4 mg total) under the tongue every 5 (five) minutes as needed for  chest pain. 25 tablet 0  . potassium chloride SA (K-DUR,KLOR-CON) 20 MEQ tablet Take 2 tablets (40 mEq total) by mouth daily. 60 tablet 1  . rOPINIRole (REQUIP) 0.5 MG tablet Take 0.5 mg by mouth at bedtime.   0  . carvedilol (COREG) 25 MG tablet Take 1 tablet (25 mg total) by mouth 2 (two) times daily. 180 tablet 3   No current facility-administered medications for this visit.     Functional Status: In your present state of health, do you have any difficulty performing the following activities: 12/17/2017 09/23/2017  Hearing? N N  Vision? N N  Difficulty concentrating or making decisions? N N  Walking or climbing stairs? N  N  Dressing or bathing? N N  Doing errands, shopping? N N  Some recent data might be hidden    Fall/Depression Screening: Fall Risk  12/08/2017 08/12/2017 05/10/2017  Falls in the past year? Yes No No  Number falls in past yr: 2 or more - -  Injury with Fall? Yes - -  Risk Factor Category  High Fall Risk - -  Follow up Education provided;Falls prevention discussed - -   PHQ 2/9 Scores 08/12/2017 05/10/2017 02/01/2017 02/01/2017 10/29/2016 10/29/2016 07/28/2016  PHQ - 2 Score 0 0 2 0 0 0 0  PHQ- 9 Score - - 5 - - - -  Exception Documentation - - - - - - -   ASSESSMENT: Date Discharged from Hospital: 12/21/17 Date Medication Reconciliation Performed: 12/22/2017   Medication Changes at Discharge:   Furosemide (increased dose)  Losartan ( decreased dose)  Levothyroxine (decreased dose).  Patient was recently discharged from hospital and all medications have been reviewed  Drugs sorted by system:  Neurologic/Psychologic: ropinirole  Cardiovascular: amiodarone, apixaban, atorvastatin, carvedilol, clopidogrel, diltiazem, furosemide, losartan, nitroglycerin, potassium chloride  Endocrine: insulin 70/30, levothyroxine, metformin  Pain: acetaminophen  Vitamins/Minerals: cholealciferol  Miscellaneous: diphenhydramine/acetaminophen   Medications to avoid in the elderly:  Per the [place] List, diphenhydramine is highly anticholinergic and clearance is reduced with advanced age. Risk of confusion, dry mouth, constipation and other anticholinergic effects or toxicity may occur.  There is strong evidence to avoid use in the elderly.  Medication Assistance: Per financial discussion, Virginia Huber does not qualify for Extra Help LIS.  [employer] reports a prescription out of pocket (OOP) expenditure of $507.06.   [place], the manufacturers of Eliquis require an expenditure of 3% of income to be eligible for patient assistance.  Virginia Huber verbalized understanding and said  she would call me back when she is closer to meeting the required 3%.  I informed her to get her insulin processed as a prescription and pay cash, so that money will count towards her 3%.  She states that she has not been doing that.   Informed patient that I will closer her [place] case at this time, as she has no other medication related questions/concerns.  Informed her that she can all me back in the future should medications issues arise.  Plan: Route note and discipline closure letter to PCP, Dr. [name].  Joetta Manners, PharmD Clinical Pharmacist [place] [phone]

## 2017-12-24 ENCOUNTER — Telehealth: Payer: Self-pay

## 2017-12-24 ENCOUNTER — Ambulatory Visit (INDEPENDENT_AMBULATORY_CARE_PROVIDER_SITE_OTHER): Payer: PPO | Admitting: *Deleted

## 2017-12-24 ENCOUNTER — Telehealth: Payer: Self-pay | Admitting: Cardiology

## 2017-12-24 DIAGNOSIS — Z79899 Other long term (current) drug therapy: Secondary | ICD-10-CM

## 2017-12-24 DIAGNOSIS — I480 Paroxysmal atrial fibrillation: Secondary | ICD-10-CM

## 2017-12-24 DIAGNOSIS — I495 Sick sinus syndrome: Secondary | ICD-10-CM

## 2017-12-24 LAB — BASIC METABOLIC PANEL
BUN/Creatinine Ratio: 16 (calc) (ref 6–22)
BUN: 21 mg/dL (ref 7–25)
CHLORIDE: 103 mmol/L (ref 98–110)
CO2: 28 mmol/L (ref 20–32)
CREATININE: 1.29 mg/dL — AB (ref 0.60–0.93)
Calcium: 9.2 mg/dL (ref 8.6–10.4)
Glucose, Bld: 146 mg/dL — ABNORMAL HIGH (ref 65–139)
POTASSIUM: 4.4 mmol/L (ref 3.5–5.3)
SODIUM: 138 mmol/L (ref 135–146)

## 2017-12-24 MED ORDER — FUROSEMIDE 40 MG PO TABS: ORAL_TABLET | ORAL | 3 refills | Status: DC

## 2017-12-24 NOTE — Telephone Encounter (Signed)
Spoke with pt and reminded pt of remote transmission that is due today. Pt verbalized understanding.   

## 2017-12-24 NOTE — Progress Notes (Signed)
Remote pacemaker transmission.   

## 2017-12-24 NOTE — Telephone Encounter (Signed)
Pt will decrease lasix to 40 mg am, 20 mg pm, I mailed lab slip

## 2017-12-24 NOTE — Telephone Encounter (Signed)
-----   Message from Herminio Commons, MD sent at 12/24/2017  3:22 PM EDT ----- Cut back Lasix to 40 mg q am and 20 mg q pm. Repeat BMET in one week. Creatinine slightly elevated.

## 2017-12-27 DIAGNOSIS — H34831 Tributary (branch) retinal vein occlusion, right eye, with macular edema: Secondary | ICD-10-CM | POA: Diagnosis not present

## 2017-12-27 DIAGNOSIS — H43811 Vitreous degeneration, right eye: Secondary | ICD-10-CM | POA: Diagnosis not present

## 2017-12-27 DIAGNOSIS — E113411 Type 2 diabetes mellitus with severe nonproliferative diabetic retinopathy with macular edema, right eye: Secondary | ICD-10-CM | POA: Diagnosis not present

## 2017-12-27 DIAGNOSIS — H348322 Tributary (branch) retinal vein occlusion, left eye, stable: Secondary | ICD-10-CM | POA: Diagnosis not present

## 2017-12-28 ENCOUNTER — Encounter: Payer: Self-pay | Admitting: Cardiology

## 2017-12-28 DIAGNOSIS — E039 Hypothyroidism, unspecified: Secondary | ICD-10-CM | POA: Diagnosis not present

## 2017-12-28 DIAGNOSIS — I509 Heart failure, unspecified: Secondary | ICD-10-CM | POA: Diagnosis not present

## 2017-12-28 DIAGNOSIS — E663 Overweight: Secondary | ICD-10-CM | POA: Diagnosis not present

## 2017-12-28 DIAGNOSIS — Z6827 Body mass index (BMI) 27.0-27.9, adult: Secondary | ICD-10-CM | POA: Diagnosis not present

## 2017-12-28 DIAGNOSIS — Z1389 Encounter for screening for other disorder: Secondary | ICD-10-CM | POA: Diagnosis not present

## 2017-12-31 ENCOUNTER — Other Ambulatory Visit: Payer: Self-pay

## 2017-12-31 DIAGNOSIS — J9601 Acute respiratory failure with hypoxia: Secondary | ICD-10-CM | POA: Diagnosis not present

## 2017-12-31 DIAGNOSIS — Z79899 Other long term (current) drug therapy: Secondary | ICD-10-CM | POA: Diagnosis not present

## 2017-12-31 DIAGNOSIS — G4733 Obstructive sleep apnea (adult) (pediatric): Secondary | ICD-10-CM | POA: Diagnosis not present

## 2017-12-31 DIAGNOSIS — E663 Overweight: Secondary | ICD-10-CM | POA: Diagnosis not present

## 2017-12-31 DIAGNOSIS — J159 Unspecified bacterial pneumonia: Secondary | ICD-10-CM | POA: Diagnosis not present

## 2017-12-31 LAB — BASIC METABOLIC PANEL
BUN / CREAT RATIO: 16 (calc) (ref 6–22)
BUN: 18 mg/dL (ref 7–25)
CALCIUM: 8.9 mg/dL (ref 8.6–10.4)
CHLORIDE: 104 mmol/L (ref 98–110)
CO2: 27 mmol/L (ref 20–32)
Creat: 1.11 mg/dL — ABNORMAL HIGH (ref 0.60–0.93)
GLUCOSE: 141 mg/dL — AB (ref 65–139)
Potassium: 4.6 mmol/L (ref 3.5–5.3)
Sodium: 138 mmol/L (ref 135–146)

## 2017-12-31 NOTE — Patient Outreach (Signed)
[facility] ([facility]) Care Management   12/31/2017  Virginia Huber 12/09/1945 [id number]  Virginia Huber is an 72 y.o. female  Subjective:  Initial home visit complete. Member alert and oriented x 3. Denied complaints of pain or discomfort.   Objective:  BP 120/62 (BP Location: Right Arm, Patient Position: Sitting, Cuff Size: Normal)   Pulse 67   Resp 20   Ht 1.6 m (5' 3")   Wt 149 lb (67.6 kg)   SpO2 96%   BMI 26.39 kg/m    Review of Systems  Constitutional: Negative.   HENT: Negative.   Respiratory: Negative.   Gastrointestinal: Negative.   Genitourinary: Negative.   Skin: Negative.   Neurological: Negative.   Psychiatric/Behavioral: The patient has insomnia.        Patient reported ongoing insomnia. MD aware and will order medication if needed.    Physical Exam  Constitutional: She is oriented to person, place, and time. She appears well-developed and well-nourished.  Neck: Normal range of motion.  Cardiovascular: Normal rate.  Respiratory: Effort normal and breath sounds normal.  GI: Soft. Bowel sounds are normal.  Neurological: She is alert and oriented to person, place, and time.  Skin: Skin is warm.  Psychiatric: She has a normal mood and affect. Her behavior is normal. Thought content normal.    Encounter Medications:   Outpatient Encounter Medications as of 12/31/2017  Medication Sig Note  . acetaminophen (TYLENOL) 325 MG tablet Take 650 mg by mouth every 6 (six) hours as needed for headache.   . amiodarone (PACERONE) 200 MG tablet Take 1 tablet (200 mg total) by mouth daily.   . apixaban (ELIQUIS) 5 MG TABS tablet TAKE (1) TABLET BY MOUTH TWICE DAILY.   . atorvastatin (LIPITOR) 80 MG tablet Take 1 tablet (80 mg total) by mouth daily at 6 PM.   . carvedilol (COREG) 25 MG tablet Take 25 mg by mouth 2 (two) times daily with a meal.   . Cholecalciferol (VITAMIN D) 2000 units tablet Take 2,000 Units by mouth daily.    . clopidogrel (PLAVIX) 75 MG  tablet TAKE ONE (1) TABLET BY MOUTH EVERY DAY   . diltiazem (CARDIZEM CD) 360 MG 24 hr capsule Take 1 capsule (360 mg total) by mouth daily.   . diphenhydramine-acetaminophen (TYLENOL PM EXTRA STRENGTH) 25-500 MG TABS tablet Take 1 tablet by mouth at bedtime as needed (Take as directed).    . furosemide (LASIX) 40 MG tablet Take 40 mg am, and 20 mg pm   . glucose blood (FREESTYLE LITE) test strip USE AS DIRECTED TWICE DAILY.   . insulin NPH-regular Human (NOVOLIN 70/30) (70-30) 100 UNIT/ML injection Inject 40 Units into the skin See admin instructions. Inject 25 units before breakfast and 25 units before supper  - only if pre-meal blood glucose is above 90 mg/dL. (Patient taking differently: Inject 25 Units into the skin See admin instructions. Inject 25 units before breakfast and 25 units before supper  - only if pre-meal blood glucose is above 90 mg/dL.)   . levothyroxine (SYNTHROID, LEVOTHROID) 150 MCG tablet Take 1 tablet (150 mcg total) by mouth daily before breakfast.   . losartan (COZAAR) 50 MG tablet Take 1 tablet (50 mg total) by mouth daily. TAKE ONE (1) TABLET EACH DAY   . metFORMIN (GLUCOPHAGE) 500 MG tablet TAKE ONE TABLET BY MOUTH TWICE A DAY WITH A MEAL   . nitroGLYCERIN (NITROSTAT) 0.4 MG SL tablet Place 1 tablet (0.4 mg total) under the tongue   every 5 (five) minutes as needed for chest pain. 12/15/2017: Found afib  . potassium chloride SA (K-DUR,KLOR-CON) 20 MEQ tablet Take 2 tablets (40 mEq total) by mouth daily.   [name] rOPINIRole (REQUIP) 0.5 MG tablet Take 0.5 mg by mouth at bedtime.  11/30/2014: .  . carvedilol (COREG) 25 MG tablet Take 1 tablet (25 mg total) by mouth 2 (two) times daily.    No facility-administered encounter medications on file as of 12/31/2017.     Functional Status:   In your present state of health, do you have any difficulty performing the following activities: 12/17/2017 09/23/2017  Hearing? N N  Vision? N N  Difficulty concentrating or making decisions? N N   Walking or climbing stairs? N N  Dressing or bathing? N N  Doing errands, shopping? N N  Some recent data might be hidden    Fall/Depression Screening:    Fall Risk  12/31/2017 12/08/2017 08/12/2017  Falls in the past year? Yes Yes No  Number falls in past yr: 2 or more 2 or more -  Injury with Fall? No Yes -  Risk Factor Category  High Fall Risk High Fall Risk -  Risk for fall due to : History of fall(s);Other (Comment) - -  Follow up Falls prevention discussed Education provided;Falls prevention discussed -   PHQ 2/9 Scores 12/31/2017 08/12/2017 05/10/2017 02/01/2017 02/01/2017 10/29/2016 10/29/2016  PHQ - 2 Score 0 0 0 2 0 0 0  PHQ- 9 Score - - - 5 - - -  Exception Documentation - - - - - - -    Assessment:   RN CM met with Virginia Huber to complete initial home visit. Spouse [name] present during the visit. Discussed [facility] Care Management services. Consent obtained. She reported taking medications as prescribed and compliance with daily weights. Todays weight was 149 lbs.  Discussed management of CHF and Diabetes. Patient denied experiencing symptoms related to CHF complications since discharge. Reported compliance with Diabetic diet and glucose monitoring.   [name] reported compliance with attending scheduled follow up visits and denied a current need for transportation assistance. Identified her primary caregivers as Virginia Huber(spouse) and Virginia Huber(daughter). She reported that both were available to assist as needed and denied a current need for [facility] LCSW outreach.  [name] was agreeable to continued outreach from [facility]. Contact information provided. Member advised to contact RN CM with questions and concerns as needed.    [facility] CM Care Plan Problem One     Most Recent Value  Care Plan Problem One  Risk for readmission related to CHF management.  Role Documenting the Problem One  Care Management Coordinator  Care Plan for Problem One  Active  [facility] Long Term Goal   Over the next 45  days patient will not have a hospitalization related to CHF.  [facility] Long Term Goal Start Date  12/31/17  Interventions for Problem One Long Term Goal  RN CM discussed signs and symptoms of complications related to heart failure. Provided CHF calendar log and educated patient regarding heart failure zones.  [facility] CM Short Term Goal #1   Over the next 30 days patient will complete daily weights and take medications as prescribed.  [facility] CM Short Term Goal #1 Start Date  12/31/17  Interventions for Short Term Goal #1  RN CM reviewed heart failure medications and emphasized importance of medication adhere    [facility] CM Care Plan Problem Two     Most Recent Value  Care Plan Problem  Two  Risk for falls as evidenced by history of multiple falls.  Role Documenting the Problem Two  Care Management [place] for Problem Two  Active  [facility] CM Short Term Goal #1   Patient will not fall over the next 30 days.  [facility] CM Short Term Goal #1 Start Date  12/31/17  Interventions for Short Term Goal #2   RN CM discussed fall prevention measures and encouraged patient to ensure walker was available for use when needed.      PLAN RN CM will follow up on next week.    [place] [phone]

## 2018-01-07 ENCOUNTER — Other Ambulatory Visit: Payer: Self-pay

## 2018-01-07 NOTE — Patient Outreach (Signed)
[facility]) Care Management  01/07/2018  Virginia Huber February 22, 1946 [id number]   Outreach call placed to Virginia Huber. Member reported that she was doing well and preparing to travel out of the state. She reported medication compliance and stated that she obtained medication refills in preparation for the trip. Virginia Huber reported today's weight was 149lbs, and a fasting blood glucose reading was 126.   Virginia Huber denied symptoms of Heart Failure complications and stated that she would take breaks at frequent intervals to prevent edema in her lower extremities. No urgent concerns voiced at this time. She agreed to telephonic outreach following her return on next week.     [facility] CM Care Plan Problem One     Most Recent Value  Care Plan Problem One  Risk for readmission related to CHF management.  Role Documenting the Problem One  Care Management Coordinator  Care Plan for Problem One  Active  [facility] Long Term Goal   Over the next 45 days patient will not have a hospitalization related to CHF.  [facility] Long Term Goal Start Date  12/31/17  Interventions for Problem One Long Term Goal  RN CM reviewed signs of CHF complications.   [facility] CM Short Term Goal #1   Over the next 30 days patient will complete daily weights and take medications as prescribed.  [facility] CM Short Term Goal #1 Start Date  12/31/17  Interventions for Short Term Goal #1  RN CM discussed importance of medication adherence. Confirmed that patient had all of her prescribed medications prior to traveling out of town this weekend.     [facility] CM Care Plan Problem Two     Most Recent Value  Care Plan Problem Two  Risk for falls as evidenced by history of multiple falls.  Role Documenting the Problem Two  Care Management [place] for Problem Two  Active  Interventions for Problem Two Long Term Goal   RN CM discussed safety precautions and importance of ambulating with assistive device when needed.   [facility]  Long Term Goal  Over the next 45 days patient will not experience injuries related to falls.  [facility] Long Term Goal Start Date  12/31/17  [facility] CM Short Term Goal #1   Over the next 30 days patient will avoid ambulating when experiencing dizziness or weakness.  [facility] CM Short Term Goal #1 Start Date  12/31/17  Interventions for Short Term Goal #2   Discussed fall prevention measures.  [facility] CM Short Term Goal #2   Over the next 30 days patient will use assistive device when ambulating as needed.  [facility] CM Short Term Goal #2 Start Date  12/31/17  Interventions for Short Term Goal #2  Discussed and encouraged use of assistive device when ambulating.     PLAN RN CM will follow up on next week.    [place] [phone]

## 2018-01-12 ENCOUNTER — Other Ambulatory Visit: Payer: Self-pay

## 2018-01-12 NOTE — Patient Outreach (Addendum)
[place] [facility]) Care Management  01/12/2018  ALESHKA CORNEY Jan 25, 1946 [id number]    Routine outreach call complete. Mrs. Selinger reported doing well after returning home and denied falls or injuries while traveling. She reported medication compliance and denied complaints of chest discomfort or shortness of breath. She reported a daily weight of 151 lbs and a fasting blood glucose level of 102. Mrs. Swatzell voiced no urgent questions or concerns and agreed to notify RN CM as needed.   [facility] CM Care Plan Problem One     Most Recent Value  Care Plan Problem One  Risk for readmission related to CHF management.  Role Documenting the Problem One  Care Management Coordinator  Care Plan for Problem One  Active  [facility] Long Term Goal   Over the next 45 days patient will not have a hospitalization related to CHF.  [facility] Long Term Goal Start Date  12/31/17  Interventions for Problem One Long Term Goal  Discussed symptoms of CHF complications.  [facility] CM Short Term Goal #1   Over the next 30 days patient will complete daily weights and take medications as prescribed.  [facility] CM Short Term Goal #1 Start Date  12/31/17  Interventions for Short Term Goal #1  RN CM discussed patient's compliance with daily weights and taking medications as prescribed.    [facility] CM Care Plan Problem Two     Most Recent Value  Care Plan Problem Two  Risk for falls as evidenced by history of multiple falls.  Role Documenting the Problem Two  Care Management Coordinator  Care Plan for Problem Two  Active  Interventions for Problem Two Long Term Goal   Reviewed fall precautions.  [facility] Long Term Goal  Over the next 45 days patient will not experience injuries related to falls.  [facility] Long Term Goal Start Date  12/31/17  [facility] CM Short Term Goal #1   Over the next 30 days patient will avoid ambulating when experiencing dizziness or weakness.  [facility] CM Short Term Goal #1 Start Date  12/31/17  Interventions for Short Term Goal  #2   Reviewed fall precautions.  [facility] CM Short Term Goal #2   Over the next 30 days patient will use assistive device when ambulating as needed.  [facility] CM Short Term Goal #2 Start Date  12/31/17  Interventions for Short Term Goal #2  Reviewed fall precautions.     PLAN  RN CM will follow up on next week.   [place] [phone]

## 2018-01-14 LAB — CUP PACEART REMOTE DEVICE CHECK
Battery Remaining Longevity: 94 mo
Battery Voltage: 3.01 V
Brady Statistic AP VS Percent: 2.16 %
Brady Statistic RA Percent Paced: 57.74 %
Brady Statistic RV Percent Paced: 62.19 %
Implantable Lead Implant Date: 20180104
Implantable Lead Implant Date: 20180104
Implantable Lead Location: 753859
Implantable Lead Model: 5076
Implantable Pulse Generator Implant Date: 20180104
Lead Channel Impedance Value: 399 Ohm
Lead Channel Impedance Value: 475 Ohm
Lead Channel Sensing Intrinsic Amplitude: 1.5 mV
Lead Channel Setting Pacing Amplitude: 2.5 V
Lead Channel Setting Pacing Pulse Width: 0.4 ms
Lead Channel Setting Sensing Sensitivity: 2.8 mV
MDC IDC LEAD LOCATION: 753860
MDC IDC MSMT LEADCHNL RA IMPEDANCE VALUE: 380 Ohm
MDC IDC MSMT LEADCHNL RA PACING THRESHOLD AMPLITUDE: 0.75 V
MDC IDC MSMT LEADCHNL RA PACING THRESHOLD PULSEWIDTH: 0.4 ms
MDC IDC MSMT LEADCHNL RA SENSING INTR AMPL: 1.5 mV
MDC IDC MSMT LEADCHNL RV IMPEDANCE VALUE: 342 Ohm
MDC IDC MSMT LEADCHNL RV PACING THRESHOLD AMPLITUDE: 1.125 V
MDC IDC MSMT LEADCHNL RV PACING THRESHOLD PULSEWIDTH: 0.4 ms
MDC IDC MSMT LEADCHNL RV SENSING INTR AMPL: 13.25 mV
MDC IDC MSMT LEADCHNL RV SENSING INTR AMPL: 13.25 mV
MDC IDC SESS DTM: 20190705131237
MDC IDC SET LEADCHNL RA PACING AMPLITUDE: 2 V
MDC IDC STAT BRADY AP VP PERCENT: 55.59 %
MDC IDC STAT BRADY AS VP PERCENT: 6.65 %
MDC IDC STAT BRADY AS VS PERCENT: 35.6 %

## 2018-01-17 ENCOUNTER — Ambulatory Visit: Payer: PPO | Admitting: Cardiovascular Disease

## 2018-01-18 ENCOUNTER — Ambulatory Visit: Payer: PPO | Admitting: Cardiovascular Disease

## 2018-01-18 ENCOUNTER — Encounter: Payer: Self-pay | Admitting: Cardiovascular Disease

## 2018-01-18 VITALS — BP 138/58 | HR 60 | Ht 63.0 in | Wt 149.0 lb

## 2018-01-18 DIAGNOSIS — I484 Atypical atrial flutter: Secondary | ICD-10-CM | POA: Diagnosis not present

## 2018-01-18 DIAGNOSIS — E785 Hyperlipidemia, unspecified: Secondary | ICD-10-CM

## 2018-01-18 DIAGNOSIS — I480 Paroxysmal atrial fibrillation: Secondary | ICD-10-CM | POA: Diagnosis not present

## 2018-01-18 DIAGNOSIS — Z950 Presence of cardiac pacemaker: Secondary | ICD-10-CM | POA: Diagnosis not present

## 2018-01-18 DIAGNOSIS — I25118 Atherosclerotic heart disease of native coronary artery with other forms of angina pectoris: Secondary | ICD-10-CM

## 2018-01-18 DIAGNOSIS — I10 Essential (primary) hypertension: Secondary | ICD-10-CM | POA: Diagnosis not present

## 2018-01-18 DIAGNOSIS — I5032 Chronic diastolic (congestive) heart failure: Secondary | ICD-10-CM

## 2018-01-18 DIAGNOSIS — R0609 Other forms of dyspnea: Secondary | ICD-10-CM | POA: Diagnosis not present

## 2018-01-18 DIAGNOSIS — Z79899 Other long term (current) drug therapy: Secondary | ICD-10-CM

## 2018-01-18 MED ORDER — CARVEDILOL 12.5 MG PO TABS: 12.5000 mg | ORAL_TABLET | Freq: Two times a day (BID) | ORAL | 3 refills | Status: DC

## 2018-01-18 NOTE — Patient Instructions (Addendum)
Your physician wants you to follow-up in:  November  with [facility] has requested that you have a lexiscan myoview. For further information please visit [email or web address]. Please follow instruction sheet, as given.      DECREASE Coreg to 12.5 mg twice a day    No labs today     Thank you for choosing [facility] !

## 2018-01-18 NOTE — Progress Notes (Signed)
SUBJECTIVE: The patient presents for follow-up of chronic diastolic heart failure, coronary artery disease, and paroxysmal atrial flutter.  She was hospitalized for acute on chronic diastolic heart failure earlier this month.    She underwent DCCV on 12/15/2017 which successfully cardioverted her from atrial flutter to sinus rhythm.  While she denies chest pain, she has been expensing exertional dyspnea.  She denies palpitations.  She has had to take extra Lasix on 3 or 4 occasions since being discharged.  She is taking Lasix 40 mg in the morning and 20 mg in the evening.    Review of Systems: As per "subjective", otherwise negative.  Allergies  Allergen Reactions  . Penicillins Hives    Has patient had a PCN reaction causing immediate rash, facial/tongue/throat swelling, SOB or lightheadedness with hypotension: Yes Has patient had a PCN reaction causing severe rash involving mucus membranes or skin necrosis: No Has patient had a PCN reaction that required hospitalization No Has patient had a PCN reaction occurring within the last 10 years: No If all of the above answers are "NO", then may proceed with Cephalosporin use.  [name] Percocet [Oxycodone-Acetaminophen] Nausea And Vomiting    Current Outpatient Medications  Medication Sig Dispense Refill  . acetaminophen (TYLENOL) 325 MG tablet Take 650 mg by mouth every 6 (six) hours as needed for headache.    [name] amiodarone (PACERONE) 200 MG tablet Take 1 tablet (200 mg total) by mouth daily. 30 tablet 6  . apixaban (ELIQUIS) 5 MG TABS tablet TAKE (1) TABLET BY MOUTH TWICE DAILY. 180 tablet 3  . atorvastatin (LIPITOR) 80 MG tablet Take 1 tablet (80 mg total) by mouth daily at 6 PM. 30 tablet 6  . carvedilol (COREG) 25 MG tablet Take 1 tablet (25 mg total) by mouth 2 (two) times daily. 180 tablet 3  . carvedilol (COREG) 25 MG tablet Take 25 mg by mouth 2 (two) times daily with a meal.    . Cholecalciferol (VITAMIN D) 2000 units tablet Take  2,000 Units by mouth daily.     . clopidogrel (PLAVIX) 75 MG tablet TAKE ONE (1) TABLET BY MOUTH EVERY DAY 90 tablet 3  . diltiazem (CARDIZEM CD) 360 MG 24 hr capsule Take 1 capsule (360 mg total) by mouth daily. 90 capsule 3  . diphenhydramine-acetaminophen (TYLENOL PM EXTRA STRENGTH) 25-500 MG TABS tablet Take 1 tablet by mouth at bedtime as needed (Take as directed).     . furosemide (LASIX) 40 MG tablet Take 40 mg am, and 20 mg pm 90 tablet 3  . glucose blood (FREESTYLE LITE) test strip USE AS DIRECTED TWICE DAILY. 100 each 5  . insulin NPH-regular Human (NOVOLIN 70/30) (70-30) 100 UNIT/ML injection Inject 40 Units into the skin See admin instructions. Inject 25 units before breakfast and 25 units before supper  - only if pre-meal blood glucose is above 90 mg/dL. (Patient taking differently: Inject 25 Units into the skin See admin instructions. Inject 25 units before breakfast and 25 units before supper  - only if pre-meal blood glucose is above 90 mg/dL.) 10 mL 11  . levothyroxine (SYNTHROID, LEVOTHROID) 150 MCG tablet Take 1 tablet (150 mcg total) by mouth daily before breakfast. 30 tablet 1  . losartan (COZAAR) 50 MG tablet Take 1 tablet (50 mg total) by mouth daily. TAKE ONE (1) TABLET EACH DAY    . metFORMIN (GLUCOPHAGE) 500 MG tablet TAKE ONE TABLET BY MOUTH TWICE A DAY WITH A MEAL 180 tablet 0  .  nitroGLYCERIN (NITROSTAT) 0.4 MG SL tablet Place 1 tablet (0.4 mg total) under the tongue every 5 (five) minutes as needed for chest pain. 25 tablet 0  . potassium chloride SA (K-DUR,KLOR-CON) 20 MEQ tablet Take 2 tablets (40 mEq total) by mouth daily. 60 tablet 1  . rOPINIRole (REQUIP) 0.5 MG tablet Take 0.5 mg by mouth at bedtime.   0   No current facility-administered medications for this visit.     Past Medical History:  Diagnosis Date  . Arthritis   . Atrial fibrillation and flutter ([place])    a. h/o PAF/flutter during admission in 2013 for PNA. b. PAF during adm for NSTEMI 07/2015,  subsequent paroxysms since then.  . B12 deficiency anemia   . Cardiac tamponade 06/2016  . Coronary artery disease 11/30/2014   a. remote MI. b. h/o PTCA with scoring balloon to OM1 11/2014. c. NSTEMI 03/2015 s/p DES to prox-mid Cx. d. NSTEMI 07/2015 s/p scoring balloon/PTCA/DES to dRCA with PAF during that admission  . Cutaneous lupus erythematosus   . GERD (gastroesophageal reflux disease)   . Heart block   . History of blood transfusion 1980's   2nd surgical procedures  . HTN (hypertension)   . Hypercholesteremia   . Hypothyroidism   . Myocardial infarction ([place]) 02/2012  . Ovarian tumor   . PAD (peripheral artery disease) ([place])    a. s/p LE angio 2015; followed by Dr. Fletcher Anon - managed medically.  . Pain with urination 05/08/2015  . Paroxysmal atrial flutter ([place])   . Pericardial effusion    a. 06/2016 after ppm - s/p pericardiocentesis.  . Superficial fungus infection of skin 06/29/2013  . Tachy-brady syndrome ([place])    a. s/p Medtronic PPM 06/2016, c/b lead perf/pericardial effusion.  [name] TIA (transient ischemic attack) 08/2001; ~ 2006  . Type II diabetes mellitus ([place])   . UTI (urinary tract infection) 05/08/2013    Past Surgical History:  Procedure Laterality Date  . ABDOMINAL AORTAGRAM N/A 01/03/2014   Procedure: ABDOMINAL [name];  Surgeon: Wellington Hampshire, MD;  Location: [facility] CATH LAB;  Service: Cardiovascular;  Laterality: N/A;  . ABDOMINAL HYSTERECTOMY  1972   "partial"  . APPENDECTOMY  1970's  . CARDIAC CATHETERIZATION  2008   Tiny OM-2 with 90% narrowing. Med tx.  [name] CARDIAC CATHETERIZATION N/A 11/30/2014   Procedure: Left Heart Cath and Coronary Angiography;  Surgeon: Troy Sine, MD; LAD 20%, CFX 50%, OM1 95%, right PLB 30%, LV normal   . CARDIAC CATHETERIZATION N/A 11/30/2014   Procedure: Coronary Balloon Angioplasty;  Surgeon: Troy Sine, MD;  Angiosculpt scoring balloon and PTCA to the OM1 reducing stenosis from 95% to less than 10%  . CARDIAC CATHETERIZATION N/A  04/03/2015   Procedure: Left Heart Cath and Coronary Angiography;  Surgeon: Jolaine Artist, MD; dLAD 50%, CFX 90%, OM1 100%, PLA 15%, LVEDP 13    . CARDIAC CATHETERIZATION N/A 04/03/2015   Procedure: Coronary Stent Intervention;  Surgeon: Sherren Mocha, MD; 3.0x18 mm Xience DES to the CFX    . CARDIAC CATHETERIZATION N/A 08/02/2015   Procedure: Left Heart Cath and Coronary Angiography;  Surgeon: Troy Sine, MD;  Location: [facility] LAB;  Service: Cardiovascular;  Laterality: N/A;  . CARDIAC CATHETERIZATION N/A 08/02/2015   Procedure: Coronary Stent Intervention;  Surgeon: Troy Sine, MD;  Location: [facility] CV LAB;  Service: Cardiovascular;  Laterality: N/A;  . CARDIAC CATHETERIZATION N/A 06/25/2016   Procedure: Pericardiocentesis;  Surgeon: Will Meredith Leeds, MD;  Location: [facility]  INVASIVE CV LAB;  Service: Cardiovascular;  Laterality: N/A;  . cardiac stents    . CARDIOVERSION N/A 12/15/2017   Procedure: CARDIOVERSION;  Surgeon: Acie Fredrickson Wonda Cheng, MD;  Location[facility] ENDOSCOPY;  Service: Cardiovascular;  Laterality: N/A;  . CHOLECYSTECTOMY OPEN  1990's  . COLONOSCOPY  2005   Dr. Laural Golden: pancolonic divericula, polyp, path unknown currently  . COLONOSCOPY  2012   Dr. Oneida Alar: Normal TI, scattered diverticula in entire colon, small internal hemorrhoids, normal colon biopsies. Colonoscopy in 5-10 years.   . COLOSTOMY  05/1979  . COLOSTOMY REVERSAL  11/1979  . EP IMPLANTABLE DEVICE N/A 06/25/2016   Procedure: Lead Revision/Repair;  Surgeon: Will Meredith Leeds, MD;  Location: [facility] CV LAB;  Service: Cardiovascular;  Laterality: N/A;  . EP IMPLANTABLE DEVICE N/A 06/25/2016   Procedure: Pacemaker Implant;  Surgeon: Will Meredith Leeds, MD;  Location: [facility] CV LAB;  Service: Cardiovascular;  Laterality: N/A;  . EXCISIONAL HEMORRHOIDECTOMY  1970's  . EYE SURGERY Left 2000   "branch vein occlusion"  . EYE SURGERY Left ~ 2001   "smoothed out wrinkle"  . LEFT OOPHORECTOMY  05/1979     nicked bowel, peritonitis, colostomy; colostomy reversed 1981   . LOWER EXTREMITY ANGIOGRAM N/A 01/03/2014   Procedure: LOWER EXTREMITY ANGIOGRAM;  Surgeon: Wellington Hampshire, MD;  Location: [facility] CATH LAB;  Service: Cardiovascular;  Laterality: N/A;  . Nuclear med stress test  10/2011   Small area of mild ischemia inferoapically.  [name] PARTIAL HYSTERECTOMY  1970's   left ovaries, then ovaries removed later due tumors   . RIGHT OOPHORECTOMY  1970's    Social History   Socioeconomic History  . Marital status: Married    Spouse name: Not on file  . Number of children: Not on file  . Years of education: Not on file  . Highest education level: Not on file  Occupational History  . Occupation: Retired    Fish farm manager: RETIRED    Comment: Research officer, political party  Social Needs  . Financial resource strain: Not on file  . Food insecurity:    Worry: Not on file    Inability: Not on file  . Transportation needs:    Medical: Not on file    Non-medical: Not on file  Tobacco Use  . Smoking status: Never Smoker  . Smokeless tobacco: Never Used  . Tobacco comment: Never smoked  Substance and Sexual Activity  . Alcohol use: No    Alcohol/week: 0.0 oz  . Drug use: No  . Sexual activity: Never    Birth control/protection: Surgical    Comment: hyst  Lifestyle  . Physical activity:    Days per week: Not on file    Minutes per session: Not on file  . Stress: Not on file  Relationships  . Social connections:    Talks on phone: Not on file    Gets together: Not on file    Attends religious service: Not on file    Active member of club or organization: Not on file    Attends meetings of clubs or organizations: Not on file    Relationship status: Not on file  . Intimate partner violence:    Fear of current or ex partner: Not on file    Emotionally abused: Not on file    Physically abused: Not on file    Forced sexual activity: Not on file  Other Topics Concern  . Not on file  Social History  Narrative  . Not on file  Vitals:   01/18/18 1256  BP: (!) 138/58  Pulse: 60  SpO2: 96%  Weight: 149 lb (67.6 kg)  Height: 5\' 3"  (1.6 m)    Wt Readings from Last 3 Encounters:  01/18/18 149 lb (67.6 kg)  12/31/17 149 lb (67.6 kg)  12/21/17 151 lb 10.8 oz (68.8 kg)     PHYSICAL EXAM General: NAD HEENT: Normal. Neck: No JVD, no thyromegaly. Lungs: Clear to auscultation bilaterally with normal respiratory effort. CV: Regular rate and rhythm, normal S1/S2, no S3/S4, no murmur. No pretibial or periankle edema.     Abdomen: Soft, nontender, no distention.  Neurologic: Alert and oriented.  Psych: Normal affect. Skin: Normal. Musculoskeletal: No gross deformities.    ECG: Reviewed above under Subjective   Labs: Lab Results  Component Value Date/Time   K 4.6 12/31/2017 08:50 AM   BUN 18 12/31/2017 08:50 AM   BUN 14 12/14/2017 01:16 PM   CREATININE 1.11 (H) 12/31/2017 08:50 AM   ALT 107 (H) 12/17/2017 10:43 AM   TSH 0.059 (L) 12/17/2017 06:27 PM   TSH 0.05 (L) 12/06/2017 08:09 AM   HGB 10.9 (L) 12/17/2017 10:35 AM   HGB 11.8 12/14/2017 01:16 PM     Lipids: Lab Results  Component Value Date/Time   LDLCALC 96 08/05/2017 08:07 AM   CHOL 166 08/05/2017 08:07 AM   TRIG 160 (H) 08/05/2017 08:07 AM   HDL 43 (L) 08/05/2017 08:07 AM       ASSESSMENT AND PLAN: 1. CAD with prior PCI's (s/p PTCA of OM1 in 2016, DES to LCx in 2016, DES to RCA in 2017) with exertional dyspnea: Continue Lipitor, clopidogrel, andcarvedilol (I will reduce the dose to 12.5 mg twice daily to see if it helps with symptoms of fatigue although I suspect it may be due to amiodarone).  I will also obtain a Lexiscan Myoview to evaluate for ischemia.  2. AcceleratedHTN:Controlled.  I am reducing the dose of carvedilol to 12.5 mg twice daily so this will need further monitoring.  3. Hyperlipidemia: Continue Lipitor 80 mg daily.   4. Chronic diastolic heart failure: Euvolemic.  Weight is  stable at 149 pounds.  Continue Lasix  40 mg in the morning and 20 mg in the evening. I instructed her to take an extra 40 mg Lasix if weight should increase by 3 pounds in 24 hours or 5 pounds in a week.  Also instructed her to increase potassium on those days as well.  She has modified her diet considerably and tries to adhere to a low-sodium and low carbohydrate diet.  5. Atrial fibrillation and flutter: Currently ina regularrhythm.  DCCV performed in June 2019.  Continue amiodarone and Eliquis as well as long-acting diltiazem and carvedilol (reduced dose of 12.5 mg twice daily).TSH  0.059 on 12/17/2017. AST 58 and ALT elevated up to 107 on 12/17/17.  She may be switched to Multaq by EP at next visit in late September.  6. Pacemaker: Normal function.  Implanted in January 2018.  7. PVV:ZSMOLM driving hypertension.She was not able to tolerate CPAP. Ipreviouslyasked her to speak with her sleep specialist about obtaining an oralmandibular advancement appliance.     Disposition: Follow up in November 2019   Kate Sable, M.D., F.A.C.C.

## 2018-01-20 ENCOUNTER — Encounter (HOSPITAL_COMMUNITY)
Admission: RE | Admit: 2018-01-20 | Discharge: 2018-01-20 | Disposition: A | Discharge status: Home / Self Care | Payer: PPO | Source: Ambulatory Visit | Attending: Cardiovascular Disease | Admitting: Cardiovascular Disease

## 2018-01-20 ENCOUNTER — Encounter (HOSPITAL_BASED_OUTPATIENT_CLINIC_OR_DEPARTMENT_OTHER)
Admission: RE | Admit: 2018-01-20 | Discharge: 2018-01-20 | Disposition: A | Discharge status: Home / Self Care | Payer: PPO | Source: Ambulatory Visit | Attending: Cardiovascular Disease | Admitting: Cardiovascular Disease

## 2018-01-20 ENCOUNTER — Encounter (HOSPITAL_COMMUNITY): Payer: Self-pay

## 2018-01-20 DIAGNOSIS — R0609 Other forms of dyspnea: Secondary | ICD-10-CM | POA: Insufficient documentation

## 2018-01-20 HISTORY — DX: Heart failure, unspecified: I50.9

## 2018-01-20 LAB — NM MYOCAR MULTI W/SPECT W/WALL MOTION / EF
CHL CUP RESTING HR STRESS: 60 {beats}/min
CSEPPHR: 62 {beats}/min
LV dias vol: 100 mL (ref 46–106)
LV sys vol: 36 mL
NUC STRESS TID: 1.13
RATE: 0.46
SDS: 0
SRS: 3
SSS: 3

## 2018-01-20 MED ORDER — SODIUM CHLORIDE 0.9% FLUSH
INTRAVENOUS | Status: AC
  Administered 2018-01-20: 10 mL via INTRAVENOUS
  Filled 2018-01-20: qty 10

## 2018-01-20 MED ORDER — TECHNETIUM TC 99M TETROFOSMIN IV KIT
10.0000 | PACK | Freq: Once | INTRAVENOUS | Status: AC | PRN
  Administered 2018-01-20: 10 via INTRAVENOUS

## 2018-01-20 MED ORDER — REGADENOSON 0.4 MG/5ML IV SOLN
INTRAVENOUS | Status: AC
  Administered 2018-01-20: 0.4 mg via INTRAVENOUS
  Filled 2018-01-20: qty 5

## 2018-01-20 MED ORDER — TECHNETIUM TC 99M TETROFOSMIN IV KIT
30.0000 | PACK | Freq: Once | INTRAVENOUS | Status: AC | PRN
  Administered 2018-01-20: 33 via INTRAVENOUS

## 2018-01-21 ENCOUNTER — Other Ambulatory Visit: Payer: Self-pay

## 2018-01-24 NOTE — Patient Outreach (Addendum)
[facility]) Care Management  01/21/2018  Virginia Huber 1946/04/26 [id number]    RN CM placed routine outreach call. Member not available. RN CM left HIPAA compliant voice message requesting a return call.    ADDENDUM Member returned called and left voice message acknowledging receipt of RNCM's outreach call. Will continue routine outreach.   [place] [phone]

## 2018-01-31 DIAGNOSIS — H43811 Vitreous degeneration, right eye: Secondary | ICD-10-CM | POA: Diagnosis not present

## 2018-01-31 DIAGNOSIS — G4733 Obstructive sleep apnea (adult) (pediatric): Secondary | ICD-10-CM | POA: Diagnosis not present

## 2018-01-31 DIAGNOSIS — E663 Overweight: Secondary | ICD-10-CM | POA: Diagnosis not present

## 2018-01-31 DIAGNOSIS — H34831 Tributary (branch) retinal vein occlusion, right eye, with macular edema: Secondary | ICD-10-CM | POA: Diagnosis not present

## 2018-01-31 DIAGNOSIS — J159 Unspecified bacterial pneumonia: Secondary | ICD-10-CM | POA: Diagnosis not present

## 2018-01-31 DIAGNOSIS — J9601 Acute respiratory failure with hypoxia: Secondary | ICD-10-CM | POA: Diagnosis not present

## 2018-02-12 ENCOUNTER — Other Ambulatory Visit: Payer: Self-pay | Admitting: Cardiovascular Disease

## 2018-02-14 ENCOUNTER — Other Ambulatory Visit: Payer: Self-pay | Admitting: Cardiology

## 2018-03-03 DIAGNOSIS — J159 Unspecified bacterial pneumonia: Secondary | ICD-10-CM | POA: Diagnosis not present

## 2018-03-03 DIAGNOSIS — E663 Overweight: Secondary | ICD-10-CM | POA: Diagnosis not present

## 2018-03-03 DIAGNOSIS — J9601 Acute respiratory failure with hypoxia: Secondary | ICD-10-CM | POA: Diagnosis not present

## 2018-03-03 DIAGNOSIS — G4733 Obstructive sleep apnea (adult) (pediatric): Secondary | ICD-10-CM | POA: Diagnosis not present

## 2018-03-07 DIAGNOSIS — H33301 Unspecified retinal break, right eye: Secondary | ICD-10-CM | POA: Diagnosis not present

## 2018-03-07 DIAGNOSIS — H34831 Tributary (branch) retinal vein occlusion, right eye, with macular edema: Secondary | ICD-10-CM | POA: Diagnosis not present

## 2018-03-07 DIAGNOSIS — H43811 Vitreous degeneration, right eye: Secondary | ICD-10-CM | POA: Diagnosis not present

## 2018-03-07 DIAGNOSIS — H35041 Retinal micro-aneurysms, unspecified, right eye: Secondary | ICD-10-CM | POA: Diagnosis not present

## 2018-03-07 DIAGNOSIS — E113411 Type 2 diabetes mellitus with severe nonproliferative diabetic retinopathy with macular edema, right eye: Secondary | ICD-10-CM | POA: Diagnosis not present

## 2018-03-16 ENCOUNTER — Encounter: Payer: Self-pay | Admitting: Cardiology

## 2018-03-16 ENCOUNTER — Ambulatory Visit (INDEPENDENT_AMBULATORY_CARE_PROVIDER_SITE_OTHER): Payer: PPO | Admitting: Cardiology

## 2018-03-16 VITALS — BP 124/68 | HR 60 | Ht 63.0 in | Wt 151.0 lb

## 2018-03-16 DIAGNOSIS — I480 Paroxysmal atrial fibrillation: Secondary | ICD-10-CM

## 2018-03-16 DIAGNOSIS — I495 Sick sinus syndrome: Secondary | ICD-10-CM

## 2018-03-16 DIAGNOSIS — I2510 Atherosclerotic heart disease of native coronary artery without angina pectoris: Secondary | ICD-10-CM | POA: Diagnosis not present

## 2018-03-16 DIAGNOSIS — I10 Essential (primary) hypertension: Secondary | ICD-10-CM | POA: Diagnosis not present

## 2018-03-16 DIAGNOSIS — Z950 Presence of cardiac pacemaker: Secondary | ICD-10-CM

## 2018-03-16 DIAGNOSIS — I481 Persistent atrial fibrillation: Secondary | ICD-10-CM

## 2018-03-16 DIAGNOSIS — I4819 Other persistent atrial fibrillation: Secondary | ICD-10-CM

## 2018-03-16 LAB — CUP PACEART INCLINIC DEVICE CHECK
Battery Remaining Longevity: 95 mo
Battery Voltage: 3.01 V
Brady Statistic AP VP Percent: 89.42 %
Brady Statistic AS VS Percent: 6.75 %
Brady Statistic RV Percent Paced: 91.9 %
Implantable Lead Implant Date: 20180104
Implantable Lead Location: 753860
Implantable Lead Model: 5076
Implantable Pulse Generator Implant Date: 20180104
Lead Channel Impedance Value: 380 Ohm
Lead Channel Impedance Value: 380 Ohm
Lead Channel Pacing Threshold Amplitude: 1.25 V
Lead Channel Pacing Threshold Pulse Width: 0.4 ms
Lead Channel Sensing Intrinsic Amplitude: 1.5 mV
MDC IDC LEAD IMPLANT DT: 20180104
MDC IDC LEAD LOCATION: 753859
MDC IDC MSMT LEADCHNL RA IMPEDANCE VALUE: 494 Ohm
MDC IDC MSMT LEADCHNL RA PACING THRESHOLD AMPLITUDE: 0.75 V
MDC IDC MSMT LEADCHNL RV IMPEDANCE VALUE: 342 Ohm
MDC IDC MSMT LEADCHNL RV PACING THRESHOLD PULSEWIDTH: 0.4 ms
MDC IDC MSMT LEADCHNL RV SENSING INTR AMPL: 16.5 mV
MDC IDC SESS DTM: 20190925152358
MDC IDC SET LEADCHNL RA PACING AMPLITUDE: 2 V
MDC IDC SET LEADCHNL RV PACING AMPLITUDE: 2.5 V
MDC IDC SET LEADCHNL RV PACING PULSEWIDTH: 0.4 ms
MDC IDC SET LEADCHNL RV SENSING SENSITIVITY: 2.8 mV
MDC IDC STAT BRADY AP VS PERCENT: 1.31 %
MDC IDC STAT BRADY AS VP PERCENT: 2.52 %
MDC IDC STAT BRADY RA PERCENT PACED: 90.72 %

## 2018-03-16 MED ORDER — CARVEDILOL 6.25 MG PO TABS: 6.2500 mg | ORAL_TABLET | Freq: Two times a day (BID) | ORAL | 1 refills | Status: DC

## 2018-03-16 MED ORDER — DRONEDARONE HCL 400 MG PO TABS: 400.0000 mg | ORAL_TABLET | Freq: Two times a day (BID) | ORAL | 6 refills | Status: DC

## 2018-03-16 NOTE — Addendum Note (Signed)
Addended by: Stanton Kidney on: 03/16/2018 11:42 AM   Modules accepted: Orders

## 2018-03-16 NOTE — Progress Notes (Signed)
Electrophysiology Office Note   Date:  03/16/2018   ID:  Shron, Ozer 1945/11/20, MRN [id number]  PCP:  Sharilyn Sites, MD  Cardiologist:  Bronson Ing Primary Electrophysiologist:  Caelin Rayl Meredith Leeds, MD    No chief complaint on file.    History of Present Illness: Virginia Huber is a 72 y.o. female who presents today for electrophysiology evaluation.   She has a history of CAD status post MI and PCI of the OM1, with an an STEMI 03/2015 status post DES to the circumflex, an STEMI 07/2015 with PCI to the RCA and paroxysmal atrial fibrillation during that admission. She is on Eliquis. Sleep study showed mild sleep apnea. He was in the hospital on 12/17 and was found to be in A. fib with RVR. She converted to sinus rhythm after arrival. He was readmitted to the hospital on 12/23 and was noted to be in atrial flutter with rapid rates at that time. She had junctional rhythm and near-syncope after conversion to sinus rhythm. She had a postconversion pause of 5.6 seconds. Pacemaker placed 06/25/16 with lead revision 06/26/16. Device implant was complicated by pericardial effusion and tamponade requiring emergent pericardiocentesis.  Today, denies symptoms of palpitations, chest pain, shortness of breath, orthopnea, PND, lower extremity edema, claudication, dizziness, presyncope, syncope, bleeding, or neurologic sequela. The patient is tolerating medications without difficulties.  Overall she is feeling well.  Her only complaint is fatigue.  Her carvedilol dose was recently decreased, which had a minimal effect on her level of fatigue.  Otherwise she is able to do all of her daily activities.  Past Medical History:  Diagnosis Date  . Arthritis   . Atrial fibrillation and flutter ([place])    a. h/o PAF/flutter during admission in 2013 for PNA. b. PAF during adm for NSTEMI 07/2015, subsequent paroxysms since then.  . B12 deficiency anemia   . Cardiac tamponade 06/2016  . CHF (congestive heart  failure) ([place])   . Coronary artery disease 11/30/2014   a. remote MI. b. h/o PTCA with scoring balloon to OM1 11/2014. c. NSTEMI 03/2015 s/p DES to prox-mid Cx. d. NSTEMI 07/2015 s/p scoring balloon/PTCA/DES to dRCA with PAF during that admission  . Cutaneous lupus erythematosus   . GERD (gastroesophageal reflux disease)   . Heart block   . History of blood transfusion 1980's   2nd surgical procedures  . HTN (hypertension)   . Hypercholesteremia   . Hypothyroidism   . Myocardial infarction ([place]) 02/2012  . Ovarian tumor   . PAD (peripheral artery disease) ([place])    a. s/p LE angio 2015; followed by Dr. Fletcher Anon - managed medically.  . Pain with urination 05/08/2015  . Paroxysmal atrial flutter ([place])   . Pericardial effusion    a. 06/2016 after ppm - s/p pericardiocentesis.  . Superficial fungus infection of skin 06/29/2013  . Tachy-brady syndrome ([place])    a. s/p Medtronic PPM 06/2016, c/b lead perf/pericardial effusion.  [name] TIA (transient ischemic attack) 08/2001; ~ 2006  . Type II diabetes mellitus ([place])   . UTI (urinary tract infection) 05/08/2013   Past Surgical History:  Procedure Laterality Date  . ABDOMINAL AORTAGRAM N/A 01/03/2014   Procedure: ABDOMINAL [name];  Surgeon: Wellington Hampshire, MD;  Location: [facility] CATH LAB;  Service: Cardiovascular;  Laterality: N/A;  . ABDOMINAL HYSTERECTOMY  1972   "partial"  . APPENDECTOMY  1970's  . CARDIAC CATHETERIZATION  2008   Tiny OM-2 with 90% narrowing. Med tx.  [name] CARDIAC CATHETERIZATION N/A 11/30/2014  Procedure: Left Heart Cath and Coronary Angiography;  Surgeon: Troy Sine, MD; LAD 20%, CFX 50%, OM1 95%, right PLB 30%, LV normal   . CARDIAC CATHETERIZATION N/A 11/30/2014   Procedure: Coronary Balloon Angioplasty;  Surgeon: Troy Sine, MD;  Angiosculpt scoring balloon and PTCA to the OM1 reducing stenosis from 95% to less than 10%  . CARDIAC CATHETERIZATION N/A 04/03/2015   Procedure: Left Heart Cath and Coronary Angiography;  Surgeon:  Jolaine Artist, MD; dLAD 50%, CFX 90%, OM1 100%, PLA 15%, LVEDP 13    . CARDIAC CATHETERIZATION N/A 04/03/2015   Procedure: Coronary Stent Intervention;  Surgeon: Sherren Mocha, MD; 3.0x18 mm Xience DES to the CFX    . CARDIAC CATHETERIZATION N/A 08/02/2015   Procedure: Left Heart Cath and Coronary Angiography;  Surgeon: Troy Sine, MD;  Location: [facility] CV LAB;  Service: Cardiovascular;  Laterality: N/A;  . CARDIAC CATHETERIZATION N/A 08/02/2015   Procedure: Coronary Stent Intervention;  Surgeon: Troy Sine, MD;  Location: [facility] CV LAB;  Service: Cardiovascular;  Laterality: N/A;  . CARDIAC CATHETERIZATION N/A 06/25/2016   Procedure: Pericardiocentesis;  Surgeon: Kalysta Kneisley Meredith Leeds, MD;  Location: [facility] CV LAB;  Service: Cardiovascular;  Laterality: N/A;  . cardiac stents    . CARDIOVERSION N/A 12/15/2017   Procedure: CARDIOVERSION;  Surgeon: Acie Fredrickson Wonda Cheng, MD;  Location: [facility] ENDOSCOPY;  Service: Cardiovascular;  Laterality: N/A;  . CHOLECYSTECTOMY OPEN  1990's  . COLONOSCOPY  2005   Dr. Laural Golden: pancolonic divericula, polyp, path unknown currently  . COLONOSCOPY  2012   Dr. Oneida Alar: Normal TI, scattered diverticula in entire colon, small internal hemorrhoids, normal colon biopsies. Colonoscopy in 5-10 years.   . COLOSTOMY  05/1979  . COLOSTOMY REVERSAL  11/1979  . EP IMPLANTABLE DEVICE N/A 06/25/2016   Procedure: Lead Revision/Repair;  Surgeon: Hjalmer Iovino Meredith Leeds, MD;  Location: [place] [facility] CV LAB;  Service: Cardiovascular;  Laterality: N/A;  . EP IMPLANTABLE DEVICE N/A 06/25/2016   Procedure: Pacemaker Implant;  Surgeon: Oneisha Ammons Meredith Leeds, MD;  Location: [facility] CV LAB;  Service: Cardiovascular;  Laterality: N/A;  . EXCISIONAL HEMORRHOIDECTOMY  1970's  . EYE SURGERY Left 2000   "branch vein occlusion"  . EYE SURGERY Left ~ 2001   "smoothed out wrinkle"  . LEFT OOPHORECTOMY  05/1979   nicked bowel, peritonitis, colostomy; colostomy reversed 1981   . LOWER  EXTREMITY ANGIOGRAM N/A 01/03/2014   Procedure: LOWER EXTREMITY ANGIOGRAM;  Surgeon: Wellington Hampshire, MD;  Location: [facility] CATH LAB;  Service: Cardiovascular;  Laterality: N/A;  . Nuclear med stress test  10/2011   Small area of mild ischemia inferoapically.  [name] PARTIAL HYSTERECTOMY  1970's   left ovaries, then ovaries removed later due tumors   . RIGHT OOPHORECTOMY  1970's     Current Outpatient Medications  Medication Sig Dispense Refill  . acetaminophen (TYLENOL) 325 MG tablet Take 650 mg by mouth every 6 (six) hours as needed for headache.    [name] amiodarone (PACERONE) 200 MG tablet TAKE ONE (1) TABLET BY MOUTH EVERY DAY 90 tablet 3  . apixaban (ELIQUIS) 5 MG TABS tablet TAKE (1) TABLET BY MOUTH TWICE DAILY. 180 tablet 3  . atorvastatin (LIPITOR) 80 MG tablet TAKE ONE TABLET BY MOUTH EVERY DAY AT 6:00PM 90 tablet 1  . carvedilol (COREG) 12.5 MG tablet Take 1 tablet (12.5 mg total) by mouth 2 (two) times daily with a meal. 180 tablet 3  . Cholecalciferol (VITAMIN D) 2000 units tablet Take 2,000  Units by mouth daily.     . clopidogrel (PLAVIX) 75 MG tablet TAKE ONE (1) TABLET BY MOUTH EVERY DAY 90 tablet 3  . diltiazem (CARDIZEM CD) 360 MG 24 hr capsule Take 1 capsule (360 mg total) by mouth daily. 90 capsule 3  . diphenhydramine-acetaminophen (TYLENOL PM EXTRA STRENGTH) 25-500 MG TABS tablet Take 1 tablet by mouth at bedtime as needed (Take as directed).     . furosemide (LASIX) 40 MG tablet Take 40 mg am, and 20 mg pm 90 tablet 3  . glucose blood (FREESTYLE LITE) test strip USE AS DIRECTED TWICE DAILY. 100 each 5  . insulin NPH-regular Human (NOVOLIN 70/30) (70-30) 100 UNIT/ML injection Inject 40 Units into the skin See admin instructions. Inject 25 units before breakfast and 25 units before supper  - only if pre-meal blood glucose is above 90 mg/dL. (Patient taking differently: Inject 25 Units into the skin See admin instructions. Inject 25 units before breakfast and 25 units before supper  -  only if pre-meal blood glucose is above 90 mg/dL.) 10 mL 11  . levothyroxine (SYNTHROID, LEVOTHROID) 150 MCG tablet Take 1 tablet (150 mcg total) by mouth daily before breakfast. 30 tablet 1  . losartan (COZAAR) 50 MG tablet Take 25 mg by mouth daily.    . metFORMIN (GLUCOPHAGE) 500 MG tablet TAKE ONE TABLET BY MOUTH TWICE A DAY WITH A MEAL 180 tablet 0  . nitroGLYCERIN (NITROSTAT) 0.4 MG SL tablet Place 1 tablet (0.4 mg total) under the tongue every 5 (five) minutes as needed for chest pain. 25 tablet 0  . potassium chloride SA (K-DUR,KLOR-CON) 20 MEQ tablet Take 2 tablets (40 mEq total) by mouth daily. 60 tablet 1  . rOPINIRole (REQUIP) 0.5 MG tablet Take 0.5 mg by mouth at bedtime.   0   No current facility-administered medications for this visit.     Allergies:   Penicillins and Percocet [oxycodone-acetaminophen]   Social History:  The patient  reports that she has never smoked. She has never used smokeless tobacco. She reports that she does not drink alcohol or use drugs.   Family History:  The patient's family history includes Diabetes in her brother; Heart disease in her brother, brother, father, mother, and sister; Lupus in her daughter; Thyroid disease in her brother and brother.   ROS:  Please see the history of present illness.   Otherwise, review of systems is positive for leg swelling, muscle pain.   All other systems are reviewed and negative.   PHYSICAL EXAM: VS:  BP 124/68   Pulse 60   Ht 5\' 3"  (1.6 m)   Wt 151 lb (68.5 kg)   SpO2 99%   BMI 26.75 kg/m  , BMI Body mass index is 26.75 kg/m. GEN: Well nourished, well developed, in no acute distress  HEENT: normal  Neck: no JVD, carotid bruits, or masses Cardiac: RRR; no murmurs, rubs, or gallops,no edema  Respiratory:  clear to auscultation bilaterally, normal work of breathing GI: soft, nontender, nondistended, + BS MS: no deformity or atrophy  Skin: warm and dry, device site well healed Neuro:  Strength and  sensation are intact Psych: euthymic mood, full affect  EKG:  EKG is ordered today. Personal review of the ekg ordered shows AV paced  Personal review of the device interrogation today. Results in [place]: 12/17/2017: ALT 107; B Natriuretic Peptide 677.0; Hemoglobin 10.9; Platelets 234; TSH 0.059 12/31/2017: BUN 18; Creat 1.11; Potassium 4.6; Sodium 138  Lipid Panel     Component Value Date/Time   CHOL 166 08/05/2017 0807   TRIG 160 (H) 08/05/2017 0807   HDL 43 (L) 08/05/2017 0807   CHOLHDL 3.9 08/05/2017 0807   VLDL NOT CALC 04/14/2016 0910   LDLCALC 96 08/05/2017 0807     Wt Readings from Last 3 Encounters:  03/16/18 151 lb (68.5 kg)  01/18/18 149 lb (67.6 kg)  12/31/17 149 lb (67.6 kg)      Other studies Reviewed: Additional studies/ records that were reviewed today include: TTE 08/04/17 Review of the above records today demonstrates:  - Left ventricle: The cavity size was normal. Wall thickness was   increased in a pattern of moderate LVH. Systolic function was   normal. The estimated ejection fraction was in the range of 55%   to 60%. Wall motion was normal; there were no regional wall   motion abnormalities. Features are consistent with a pseudonormal   left ventricular filling pattern, with concomitant abnormal   relaxation and increased filling pressure (grade 2 diastolic   dysfunction). Doppler parameters are consistent with high   ventricular filling pressure. - Left atrium: The atrium was severely dilated. - Right ventricle: Pacer wire or catheter noted in right ventricle. - Right atrium: Pacer wire or catheter noted in right atrium.  Cath  08/02/15  1st RPLB lesion, 50% stenosed.  Dist RCA lesion, 30% stenosed.  Ost 1st Mrg to 1st Mrg lesion, 100% stenosed.  Ost LAD lesion, 40% stenosed.  Mid LAD lesion, 40% stenosed.  Post Atrio lesion, 90% stenosed. Post intervention, there is a 0% residual stenosis.  The left ventricular  systolic function is normal.  Mid RCA lesion, 20% stenosed.   Normal LV function without focal segmental wall motion abnormalities and ejection fraction of 55%.  Successful percutaneous cardiac intervention to the distal RCA treated with Angiosculpt scoring balloon, PTCA, and ultimate stenting with a 2.58 mm Xience Alpine DES stent postdilated to 2.51 mm with a 90% stenosis reduced to 0% and no change in the ostial PDA narrowing.  ASSESSMENT AND PLAN:  1.  Paroxysmal atrial fibrillation with tachybradycardia syndrome: Post Medtronic dual-chamber pacemaker functioning appropriately.  She is RV and a pacing most of the time.  We have turned rate response and MVP on.  She has been on amiodarone.  We Amaia Lavallie stop that and start Multitak. This patients CHA2DS2-VASc Score and unadjusted Ischemic Stroke Rate (% per year) is equal to 11.2 % stroke rate/year from a score of 7  Above score calculated as 1 point each if present [CHF, HTN, DM, Vascular=MI/PAD/Aortic Plaque, Age if 65-74, or Female] Above score calculated as 2 points each if present [Age > 75, or Stroke/TIA/TE]  2. Coronary artery disease: Normal chest pain.  No changes.  3. Hypertension: Well-controlled.  Current medicines are reviewed at length with the patient today.   The patient does not have concerns regarding her medicines.  The following changes were made today: Stop amiodarone, start Multitak, decrease Coreg  Labs/ tests ordered today include:  Orders Placed This Encounter  Procedures  . EKG 12-Lead     Disposition:   FU with Jowel Waltner 6 months  Signed, Finley Dinkel Meredith Leeds, MD  03/16/2018 11:34 AM     [facility] [address]  [place] [address] [phone] (office) [phone] (fax)

## 2018-03-16 NOTE — Patient Instructions (Signed)
Medication Instructions:  Your physician has recommended you make the following change in your medication:  1. DECREASE Carvedilol to 6.25 mg twice daily  (the new prescription is for 6.25 mg tablets) 2. STOP Amiodarone 3. START Multaq 400 mg twice daily -- DO NOT START THIS MEDICATION UNTIL 2 WEEKS AFTER STOPPING AMIODARONE  *If you need a refill on your cardiac medications before your next appointment, please call your pharmacy*  Labwork: None ordered  Testing/Procedures: None ordered  Follow-Up: Remote monitoring is used to monitor your Pacemaker or ICD from home. This monitoring reduces the number of office visits required to check your device to one time per year. It allows [place] to keep an eye on the functioning of your device to ensure it is working properly. You are scheduled for a device check from home on 03/28/2018. You may send your transmission at any time that day. If you have a wireless device, the transmission will be sent automatically. After your physician reviews your transmission, you will receive a postcard with your next transmission date.  Your physician wants you to follow-up in: 6 months with Dr. Curt Bears.  You will receive a reminder letter in the mail two months in advance. If you don't receive a letter, please call our office to schedule the follow-up appointment.  Thank you for choosing [facility]!!   Trinidad Curet, RN [phone]  Any Other Special Instructions Will Be Listed Below (If Applicable).  Dronedarone tablets What is this medicine? DRONEDARONE (droe NE da rone) is an antiarrhythmic drug. It helps make your heart beat regularly. This medicine may be used for other purposes; ask your health care provider or pharmacist if you have questions. COMMON BRAND NAME(S): Multaq What should I tell my health care provider before I take this medicine? They need to know if you have any of these conditions: -heart failure -history of irregular  heartbeat -liver disease -liver or lung problems with the past use of amiodarone -low levels of magnesium in the blood -low levels of potassium in the blood -other heart disease -an unusual or allergic reaction to dronedarone, other medicines, foods, dyes, or preservatives -pregnant or trying to get pregnant -breast-feeding How should I use this medicine? Take this medicine by mouth with a glass of water. Follow the directions on the prescription label. Take one tablet with the morning meal and one tablet with the evening meal. Do not take your medicine more often than directed. Do not stop taking except on the advice of your doctor or health care professional. A special MedGuide will be given to you by the pharmacist with each prescription and refill. Be sure to read this information carefully each time. Talk to your pediatrician regarding the use of this medicine in children. Special care may be needed. Overdosage: If you think you have taken too much of this medicine contact a poison control center or emergency room at once. NOTE: This medicine is only for you. Do not share this medicine with others. What if I miss a dose? If you miss a dose, take it as soon as you can. If it is almost time for your next dose, take only that dose. Do not take double or extra doses. What may interact with this medicine? Do not take this medicine with any of the following medications: -arsenic trioxide -certain antibiotics like clarithromycin, erythromycin, pentamidine, telithromycin, troleandomycin -certain medicines for depression like tricyclic antidepressants -certain medicines for fungal infections like fluconazole, itraconazole, ketoconazole, posaconazole, voriconazole -certain medicines for irregular  heart beat like amiodarone, disopyramide, dofetilide, flecainide, ibutilide, quinidine, propafenone, sotalol -certain medicines for malaria like chloroquine,  halofantrine -cisapride -cyclosporine -droperidol -haloperidol -methadone -other medicines that prolong the QT interval (cause an abnormal heart rhythm) -pimozide -nefazodone -phenothiazines like chlorpromazine, mesoridazine, prochlorperazine, thioridazine -ritonavir -ziprasidone This medicine may also interact with the following medications: -certain medicines for blood pressure, heart disease, or irregular heart beat like diltiazem, metoprolol, propranolol, verapamil -certain medicines for cholesterol like atorvastatin, lovastatin, simvastatin -certain medicines for seizures like carbamazepine, phenobarbital, phenytoin -digoxin -grapefruit juice -rifampin -sirolimus -St. John's Wort -tacrolimus This list may not describe all possible interactions. Give your health care provider a list of all the medicines, herbs, non-prescription drugs, or dietary supplements you use. Also tell them if you smoke, drink alcohol, or use illegal drugs. Some items may interact with your medicine. What should I watch for while using this medicine? Your condition will be monitored closely when you first begin therapy. Often, this drug is first started in a hospital or other monitored health care setting. Once you are on maintenance therapy, visit your doctor or health care professional for regular checks on your progress. Because your condition and use of this medicine carry some risk, it is a good idea to carry an identification card, necklace or bracelet with details of your condition, medications, and doctor or health care professional. Dennis Bast may get drowsy or dizzy. Do not drive, use machinery, or do anything that needs mental alertness until you know how this medicine affects you. Do not stand or sit up quickly, especially if you are an older patient. This reduces the risk of dizzy or fainting spells. What side effects may I notice from receiving this medicine? Side effects that you should report to your  doctor or health care professional as soon as possible: -allergic reactions like skin rash, itching or hives, swelling of the face, lips, or tongue -breathing problems -cough -dark urine -fast, irregular heartbeat -general ill feeling or flu-like symptoms -light-colored stools -loss of appetite, nausea -right upper belly pain -slow heartbeat -stomach pain -swelling of the legs or ankles -unusually weak or tired -weight gain -yellowing of the eyes or skin Side effects that usually do not require medical attention (report to your doctor or health care professional if they continue or are bothersome): -nausea -vomiting -stomach pain This list may not describe all possible side effects. Call your doctor for medical advice about side effects. You may report side effects to FDA at 1-800-FDA-1088. Where should I keep my medicine? Keep out of the reach of children. Store at room temperature between 15 and 30 degrees C (59 and 86 degrees F). Throw away any unused medicine after the expiration date. NOTE: This sheet is a summary. It may not cover all possible information. If you have questions about this medicine, talk to your doctor, pharmacist, or health care provider.  2018 Elsevier/Gold Standard (2015-07-11 12:43:06)

## 2018-03-17 ENCOUNTER — Ambulatory Visit: Payer: PPO

## 2018-03-17 ENCOUNTER — Other Ambulatory Visit: Payer: Self-pay

## 2018-03-17 NOTE — Patient Outreach (Signed)
[facility]) Care Management  03/17/2018  IRMALEE RIEMENSCHNEIDER 02/01/1946 [id number]   Successful outreach with Mrs. Geffrard. She reported doing well. No worsening symptoms or ED visits since previous outreach. Remains compliant with plan of care. Reported increased activity tolerance and has taken several small trips since initial outreach. No falls reported. Mrs. Pinard denied urgent concerns and agreeable to follow up assessment and possible case closure.    PLAN Will follow up to schedule final home visit and complete case closure.   [place] [phone]

## 2018-03-18 ENCOUNTER — Telehealth: Payer: Self-pay

## 2018-03-18 NOTE — Telephone Encounter (Signed)
**Note De-Identified [name] Obfuscation** Letter received Virginia Huber fax from [place] stating that they have approved the pts Multaq PA. Approval good from 03/18/2018 until 06/21/2018.  I have notified the pts pharmacy.

## 2018-03-18 NOTE — Telephone Encounter (Signed)
**Note De-Identified  Obfuscation** I have done a Multaq PA through covermymeds Key: [id number]

## 2018-03-22 MED ORDER — AMIODARONE HCL 200 MG PO TABS: 200.0000 mg | ORAL_TABLET | Freq: Every day | ORAL | 3 refills | Status: DC

## 2018-03-22 NOTE — Telephone Encounter (Signed)
Reviewed w/ Dr. Curt Bears. Discussed remaining on Amiodarone another year until Multaq cost could possibly be cheaper after 27yr as generic. Pt is agreeable to plan. Amiodarone added back to medication list. Pt appreciates my help with this.

## 2018-03-28 ENCOUNTER — Ambulatory Visit (INDEPENDENT_AMBULATORY_CARE_PROVIDER_SITE_OTHER): Payer: PPO | Admitting: *Deleted

## 2018-03-28 DIAGNOSIS — E039 Hypothyroidism, unspecified: Secondary | ICD-10-CM | POA: Diagnosis not present

## 2018-03-28 DIAGNOSIS — I10 Essential (primary) hypertension: Secondary | ICD-10-CM | POA: Diagnosis not present

## 2018-03-28 DIAGNOSIS — E785 Hyperlipidemia, unspecified: Secondary | ICD-10-CM | POA: Diagnosis not present

## 2018-03-28 DIAGNOSIS — I495 Sick sinus syndrome: Secondary | ICD-10-CM

## 2018-03-28 DIAGNOSIS — E539 Vitamin B deficiency, unspecified: Secondary | ICD-10-CM | POA: Diagnosis not present

## 2018-03-28 DIAGNOSIS — Z6828 Body mass index (BMI) 28.0-28.9, adult: Secondary | ICD-10-CM | POA: Diagnosis not present

## 2018-03-28 DIAGNOSIS — E663 Overweight: Secondary | ICD-10-CM | POA: Diagnosis not present

## 2018-03-28 DIAGNOSIS — D649 Anemia, unspecified: Secondary | ICD-10-CM | POA: Diagnosis not present

## 2018-03-28 DIAGNOSIS — E119 Type 2 diabetes mellitus without complications: Secondary | ICD-10-CM | POA: Diagnosis not present

## 2018-03-28 NOTE — Progress Notes (Signed)
Remote pacemaker transmission.   

## 2018-04-02 DIAGNOSIS — G4733 Obstructive sleep apnea (adult) (pediatric): Secondary | ICD-10-CM | POA: Diagnosis not present

## 2018-04-02 DIAGNOSIS — J159 Unspecified bacterial pneumonia: Secondary | ICD-10-CM | POA: Diagnosis not present

## 2018-04-02 DIAGNOSIS — J9601 Acute respiratory failure with hypoxia: Secondary | ICD-10-CM | POA: Diagnosis not present

## 2018-04-02 DIAGNOSIS — E663 Overweight: Secondary | ICD-10-CM | POA: Diagnosis not present

## 2018-04-04 DIAGNOSIS — Z23 Encounter for immunization: Secondary | ICD-10-CM | POA: Diagnosis not present

## 2018-04-06 ENCOUNTER — Encounter: Payer: Self-pay | Admitting: Cardiology

## 2018-04-07 DIAGNOSIS — E113411 Type 2 diabetes mellitus with severe nonproliferative diabetic retinopathy with macular edema, right eye: Secondary | ICD-10-CM | POA: Diagnosis not present

## 2018-04-07 DIAGNOSIS — H34831 Tributary (branch) retinal vein occlusion, right eye, with macular edema: Secondary | ICD-10-CM | POA: Diagnosis not present

## 2018-04-07 DIAGNOSIS — H33321 Round hole, right eye: Secondary | ICD-10-CM | POA: Diagnosis not present

## 2018-04-07 DIAGNOSIS — H43811 Vitreous degeneration, right eye: Secondary | ICD-10-CM | POA: Diagnosis not present

## 2018-04-19 LAB — CUP PACEART REMOTE DEVICE CHECK
Battery Voltage: 3.02 V
Brady Statistic AP VP Percent: 0.09 %
Brady Statistic AS VP Percent: 0.01 %
Brady Statistic AS VS Percent: 1 %
Brady Statistic RV Percent Paced: 0.1 %
Implantable Lead Implant Date: 20180104
Implantable Lead Location: 753859
Implantable Lead Model: 5076
Implantable Pulse Generator Implant Date: 20180104
Lead Channel Impedance Value: 323 Ohm
Lead Channel Impedance Value: 361 Ohm
Lead Channel Impedance Value: 380 Ohm
Lead Channel Impedance Value: 475 Ohm
Lead Channel Pacing Threshold Amplitude: 0.75 V
Lead Channel Pacing Threshold Amplitude: 1.125 V
Lead Channel Pacing Threshold Pulse Width: 0.4 ms
Lead Channel Sensing Intrinsic Amplitude: 13.75 mV
Lead Channel Setting Pacing Amplitude: 2 V
Lead Channel Setting Pacing Amplitude: 2.5 V
Lead Channel Setting Pacing Pulse Width: 0.4 ms
Lead Channel Setting Sensing Sensitivity: 2.8 mV
MDC IDC LEAD IMPLANT DT: 20180104
MDC IDC LEAD LOCATION: 753860
MDC IDC MSMT BATTERY REMAINING LONGEVITY: 95 mo
MDC IDC MSMT LEADCHNL RA PACING THRESHOLD PULSEWIDTH: 0.4 ms
MDC IDC MSMT LEADCHNL RA SENSING INTR AMPL: 0.875 mV
MDC IDC MSMT LEADCHNL RA SENSING INTR AMPL: 0.875 mV
MDC IDC MSMT LEADCHNL RV SENSING INTR AMPL: 13.75 mV
MDC IDC SESS DTM: 20191007111327
MDC IDC STAT BRADY AP VS PERCENT: 98.9 %
MDC IDC STAT BRADY RA PERCENT PACED: 98.99 %

## 2018-04-27 ENCOUNTER — Ambulatory Visit (INDEPENDENT_AMBULATORY_CARE_PROVIDER_SITE_OTHER): Payer: PPO | Admitting: Cardiovascular Disease

## 2018-04-27 ENCOUNTER — Encounter: Payer: Self-pay | Admitting: Cardiovascular Disease

## 2018-04-27 VITALS — BP 144/70 | HR 83 | Ht 63.0 in | Wt 153.0 lb

## 2018-04-27 DIAGNOSIS — I480 Paroxysmal atrial fibrillation: Secondary | ICD-10-CM

## 2018-04-27 DIAGNOSIS — I10 Essential (primary) hypertension: Secondary | ICD-10-CM | POA: Diagnosis not present

## 2018-04-27 DIAGNOSIS — Z79899 Other long term (current) drug therapy: Secondary | ICD-10-CM | POA: Diagnosis not present

## 2018-04-27 DIAGNOSIS — I5032 Chronic diastolic (congestive) heart failure: Secondary | ICD-10-CM | POA: Diagnosis not present

## 2018-04-27 DIAGNOSIS — G4733 Obstructive sleep apnea (adult) (pediatric): Secondary | ICD-10-CM | POA: Diagnosis not present

## 2018-04-27 DIAGNOSIS — Z950 Presence of cardiac pacemaker: Secondary | ICD-10-CM

## 2018-04-27 DIAGNOSIS — I495 Sick sinus syndrome: Secondary | ICD-10-CM

## 2018-04-27 DIAGNOSIS — I25118 Atherosclerotic heart disease of native coronary artery with other forms of angina pectoris: Secondary | ICD-10-CM

## 2018-04-27 NOTE — Progress Notes (Signed)
SUBJECTIVE: The patient presents for routine follow-up.  She last saw EP on 03/16/2018.  They initially discontinued amiodarone and started dronedarone but due to cost, she was put back on amiodarone.  Pacemaker adjustments were made due to frequent RV pacing.  She also has chronic diastolic heart failure and coronary artery disease.  Due to exertional fatigue and dyspnea I obtained a nuclear stress test.  This was performed on 01/20/2018 and was a low risk study.  There were no significant ischemic territories.  She is feeling much better.  She denies chest pain, palpitations, leg swelling, and shortness of breath.  She tried using a smaller mask for sleep apnea but could not tolerate that either.  She is scheduled to see neurology in early January.    Review of Systems: As per "subjective", otherwise negative.  Allergies  Allergen Reactions  . Penicillins Hives    Has patient had a PCN reaction causing immediate rash, facial/tongue/throat swelling, SOB or lightheadedness with hypotension: Yes Has patient had a PCN reaction causing severe rash involving mucus membranes or skin necrosis: No Has patient had a PCN reaction that required hospitalization No Has patient had a PCN reaction occurring within the last 10 years: No If all of the above answers are "NO", then may proceed with Cephalosporin use.  [name] Percocet [Oxycodone-Acetaminophen] Nausea And Vomiting    Current Outpatient Medications  Medication Sig Dispense Refill  . acetaminophen (TYLENOL) 325 MG tablet Take 650 mg by mouth every 6 (six) hours as needed for headache.    [name] amiodarone (PACERONE) 200 MG tablet Take 1 tablet (200 mg total) by mouth daily. 90 tablet 3  . apixaban (ELIQUIS) 5 MG TABS tablet TAKE (1) TABLET BY MOUTH TWICE DAILY. 180 tablet 3  . atorvastatin (LIPITOR) 80 MG tablet TAKE ONE TABLET BY MOUTH EVERY DAY AT 6:00PM 90 tablet 1  . carvedilol (COREG) 6.25 MG tablet Take 1 tablet (6.25 mg total) by mouth  2 (two) times daily. 180 tablet 1  . Cholecalciferol (VITAMIN D) 2000 units tablet Take 2,000 Units by mouth daily.     . clopidogrel (PLAVIX) 75 MG tablet TAKE ONE (1) TABLET BY MOUTH EVERY DAY 90 tablet 3  . diltiazem (CARDIZEM CD) 360 MG 24 hr capsule Take 1 capsule (360 mg total) by mouth daily. 90 capsule 3  . diphenhydramine-acetaminophen (TYLENOL PM EXTRA STRENGTH) 25-500 MG TABS tablet Take 1 tablet by mouth at bedtime as needed (Take as directed).     . furosemide (LASIX) 40 MG tablet Take 40 mg am, and 20 mg pm 90 tablet 3  . glucose blood (FREESTYLE LITE) test strip USE AS DIRECTED TWICE DAILY. 100 each 5  . insulin NPH-regular Human (NOVOLIN 70/30) (70-30) 100 UNIT/ML injection Inject 40 Units into the skin See admin instructions. Inject 25 units before breakfast and 25 units before supper  - only if pre-meal blood glucose is above 90 mg/dL. (Patient taking differently: Inject 25 Units into the skin See admin instructions. Inject 25 units before breakfast and 25 units before supper  - only if pre-meal blood glucose is above 90 mg/dL.) 10 mL 11  . levothyroxine (SYNTHROID, LEVOTHROID) 150 MCG tablet Take 1 tablet (150 mcg total) by mouth daily before breakfast. 30 tablet 1  . losartan (COZAAR) 50 MG tablet Take 25 mg by mouth daily.    . metFORMIN (GLUCOPHAGE) 500 MG tablet TAKE ONE TABLET BY MOUTH TWICE A DAY WITH A MEAL 180 tablet 0  .  nitroGLYCERIN (NITROSTAT) 0.4 MG SL tablet Place 1 tablet (0.4 mg total) under the tongue every 5 (five) minutes as needed for chest pain. 25 tablet 0  . potassium chloride SA (K-DUR,KLOR-CON) 20 MEQ tablet Take 2 tablets (40 mEq total) by mouth daily. 60 tablet 1  . rOPINIRole (REQUIP) 0.5 MG tablet Take 0.5 mg by mouth at bedtime.   0   No current facility-administered medications for this visit.     Past Medical History:  Diagnosis Date  . Arthritis   . Atrial fibrillation and flutter ([place])    a. h/o PAF/flutter during admission in 2013 for PNA.  b. PAF during adm for NSTEMI 07/2015, subsequent paroxysms since then.  . B12 deficiency anemia   . Cardiac tamponade 06/2016  . CHF (congestive heart failure) ([place])   . Coronary artery disease 11/30/2014   a. remote MI. b. h/o PTCA with scoring balloon to OM1 11/2014. c. NSTEMI 03/2015 s/p DES to prox-mid Cx. d. NSTEMI 07/2015 s/p scoring balloon/PTCA/DES to dRCA with PAF during that admission  . Cutaneous lupus erythematosus   . GERD (gastroesophageal reflux disease)   . Heart block   . History of blood transfusion 1980's   2nd surgical procedures  . HTN (hypertension)   . Hypercholesteremia   . Hypothyroidism   . Myocardial infarction ([place]) 02/2012  . Ovarian tumor   . PAD (peripheral artery disease) ([place])    a. s/p LE angio 2015; followed by Dr. Fletcher Anon - managed medically.  . Pain with urination 05/08/2015  . Paroxysmal atrial flutter ([place])   . Pericardial effusion    a. 06/2016 after ppm - s/p pericardiocentesis.  . Superficial fungus infection of skin 06/29/2013  . Tachy-brady syndrome ([place])    a. s/p Medtronic PPM 06/2016, c/b lead perf/pericardial effusion.  [name] TIA (transient ischemic attack) 08/2001; ~ 2006  . Type II diabetes mellitus ([place])   . UTI (urinary tract infection) 05/08/2013    Past Surgical History:  Procedure Laterality Date  . ABDOMINAL AORTAGRAM N/A 01/03/2014   Procedure: ABDOMINAL [name];  Surgeon: Wellington Hampshire, MD;  Location: [place] CATH LAB;  Service: Cardiovascular;  Laterality: N/A;  . ABDOMINAL HYSTERECTOMY  1972   "partial"  . APPENDECTOMY  1970's  . CARDIAC CATHETERIZATION  2008   Tiny OM-2 with 90% narrowing. Med tx.  [name] CARDIAC CATHETERIZATION N/A 11/30/2014   Procedure: Left Heart Cath and Coronary Angiography;  Surgeon: Troy Sine, MD; LAD 20%, CFX 50%, OM1 95%, right PLB 30%, LV normal   . CARDIAC CATHETERIZATION N/A 11/30/2014   Procedure: Coronary Balloon Angioplasty;  Surgeon: Troy Sine, MD;  Angiosculpt scoring balloon and PTCA to the  OM1 reducing stenosis from 95% to less than 10%  . CARDIAC CATHETERIZATION N/A 04/03/2015   Procedure: Left Heart Cath and Coronary Angiography;  Surgeon: Jolaine Artist, MD; dLAD 50%, CFX 90%, OM1 100%, PLA 15%, LVEDP 13    . CARDIAC CATHETERIZATION N/A 04/03/2015   Procedure: Coronary Stent Intervention;  Surgeon: Sherren Mocha, MD; 3.0x18 mm Xience DES to the CFX    . CARDIAC CATHETERIZATION N/A 08/02/2015   Procedure: Left Heart Cath and Coronary Angiography;  Surgeon: Troy Sine, MD;  Location: [place] CV LAB;  Service: Cardiovascular;  Laterality: N/A;  . CARDIAC CATHETERIZATION N/A 08/02/2015   Procedure: Coronary Stent Intervention;  Surgeon: Troy Sine, MD;  Location: [place] CV LAB;  Service: Cardiovascular;  Laterality: N/A;  . CARDIAC CATHETERIZATION N/A 06/25/2016   Procedure: Pericardiocentesis;  Surgeon: Will Meredith Leeds, MD;  Location: [place] CV LAB;  Service: Cardiovascular;  Laterality: N/A;  . cardiac stents    . CARDIOVERSION N/A 12/15/2017   Procedure: CARDIOVERSION;  Surgeon: Acie Fredrickson Wonda Cheng, MD;  Location: [facility] ENDOSCOPY;  Service: Cardiovascular;  Laterality: N/A;  . CHOLECYSTECTOMY OPEN  1990's  . COLONOSCOPY  2005   Dr. Laural Golden: pancolonic divericula, polyp, path unknown currently  . COLONOSCOPY  2012   Dr. Oneida Alar: Normal TI, scattered diverticula in entire colon, small internal hemorrhoids, normal colon biopsies. Colonoscopy in 5-10 years.   . COLOSTOMY  05/1979  . COLOSTOMY REVERSAL  11/1979  . EP IMPLANTABLE DEVICE N/A 06/25/2016   Procedure: Lead Revision/Repair;  Surgeon: Will Meredith Leeds, MD;  Location: [place] CV LAB;  Service: Cardiovascular;  Laterality: N/A;  . EP IMPLANTABLE DEVICE N/A 06/25/2016   Procedure: Pacemaker Implant;  Surgeon: Will Meredith Leeds, MD;  Location: [place] CV LAB;  Service: Cardiovascular;  Laterality: N/A;  . EXCISIONAL HEMORRHOIDECTOMY  1970's  . EYE SURGERY Left 2000   "branch vein occlusion"  .  EYE SURGERY Left ~ 2001   "smoothed out wrinkle"  . LEFT OOPHORECTOMY  05/1979   nicked bowel, peritonitis, colostomy; colostomy reversed 1981   . LOWER EXTREMITY ANGIOGRAM N/A 01/03/2014   Procedure: LOWER EXTREMITY ANGIOGRAM;  Surgeon: Wellington Hampshire, MD;  Location: [place] CATH LAB;  Service: Cardiovascular;  Laterality: N/A;  . Nuclear med stress test  10/2011   Small area of mild ischemia inferoapically.  [name] PARTIAL HYSTERECTOMY  1970's   left ovaries, then ovaries removed later due tumors   . RIGHT OOPHORECTOMY  1970's    Social History   Socioeconomic History  . Marital status: Married    Spouse name: Not on file  . Number of children: Not on file  . Years of education: Not on file  . Highest education level: Not on file  Occupational History  . Occupation: Retired    Fish farm manager: RETIRED    Comment: Research officer, political party  Social Needs  . Financial resource strain: Not on file  . Food insecurity:    Worry: Not on file    Inability: Not on file  . Transportation needs:    Medical: Not on file    Non-medical: Not on file  Tobacco Use  . Smoking status: Never Smoker  . Smokeless tobacco: Never Used  . Tobacco comment: Never smoked  Substance and Sexual Activity  . Alcohol use: No    Alcohol/week: 0.0 standard drinks  . Drug use: No  . Sexual activity: Never    Birth control/protection: Surgical    Comment: hyst  Lifestyle  . Physical activity:    Days per week: Not on file    Minutes per session: Not on file  . Stress: Not on file  Relationships  . Social connections:    Talks on phone: Not on file    Gets together: Not on file    Attends religious service: Not on file    Active member of club or organization: Not on file    Attends meetings of clubs or organizations: Not on file    Relationship status: Not on file  . Intimate partner violence:    Fear of current or ex partner: Not on file    Emotionally abused: Not on file    Physically abused: Not on file     Forced sexual activity: Not on file  Other Topics Concern  . Not on file  Social  History Narrative  . Not on file     Vitals:   04/27/18 1127  BP: (!) 144/70  Pulse: 83  SpO2: 97%  Weight: 153 lb (69.4 kg)  Height: 5\' 3"  (1.6 m)    Wt Readings from Last 3 Encounters:  04/27/18 153 lb (69.4 kg)  03/16/18 151 lb (68.5 kg)  01/18/18 149 lb (67.6 kg)     PHYSICAL EXAM General: NAD HEENT: Normal. Neck: No JVD, no thyromegaly. Lungs: Clear to auscultation bilaterally with normal respiratory effort. CV: Regular rate and rhythm, normal S1/S2, no S3/S4, no murmur. No pretibial or periankle edema.  No carotid bruit.   Abdomen: Soft, nontender, no distention.  Neurologic: Alert and oriented.  Psych: Normal affect. Skin: Normal. Musculoskeletal: No gross deformities.    ECG: Reviewed above under Subjective   Labs: Lab Results  Component Value Date/Time   K 4.6 12/31/2017 08:50 AM   BUN 18 12/31/2017 08:50 AM   BUN 14 12/14/2017 01:16 PM   CREATININE 1.11 (H) 12/31/2017 08:50 AM   ALT 107 (H) 12/17/2017 10:43 AM   TSH 0.059 (L) 12/17/2017 06:27 PM   TSH 0.05 (L) 12/06/2017 08:09 AM   HGB 10.9 (L) 12/17/2017 10:35 AM   HGB 11.8 12/14/2017 01:16 PM     Lipids: Lab Results  Component Value Date/Time   LDLCALC 96 08/05/2017 08:07 AM   CHOL 166 08/05/2017 08:07 AM   TRIG 160 (H) 08/05/2017 08:07 AM   HDL 43 (L) 08/05/2017 08:07 AM       ASSESSMENT AND PLAN:  1.  Coronary artery disease: She has a history of prior PCI's (s/p PTCA of OM1 in 2016, DES to LCx in 2016, DES to RCA in 2017). Symptomatically stable.  Continue Lipitor, clopidogrel, andcarvedilol.    Low risk nuclear stress test as noted above.  2. AcceleratedHTN:Mildly elevated.  No changes.  3. Hyperlipidemia: Continue Lipitor 80 mg daily.   4. Chronic diastolic heart failure: Euvolemic.  Weight is 153 pounds.  Continue Lasix 40 mg in the morning and 20 mg in the evening.I instructed her to take  an extra 40 mg Lasix if weightshould increase by 3 pounds in 24 hours or 5 pounds in a week. Also instructed her to increase potassium on those days as well.  She has modified her diet considerably and tries to adhere to a low-sodium and low carbohydrate diet.  5.  Paroxysmal atrial fibrillation and flutter: Currently ina regularrhythm.  DCCV performed in June 2019.  Continue amiodarone and Eliquis as well as long-acting diltiazem and carvedilol.TSH 0.059 on 12/17/2017.AST 58 and ALT elevated up to 107 on 12/17/17.    6. Pacemaker: Normal function.  Implanted in January 2018.  Adjustments recently made in September 2019 by EP.  7. APO:LIDCVU driving hypertension.She was not able to tolerate CPAP nor an oralmandibular advancement appliance. She is scheduled to see neurology in early January.   Disposition: Follow up 6 months   Kate Sable, M.D., F.A.C.C.

## 2018-04-27 NOTE — Patient Instructions (Signed)

## 2018-05-02 ENCOUNTER — Other Ambulatory Visit: Payer: Self-pay

## 2018-05-02 ENCOUNTER — Ambulatory Visit: Payer: Self-pay

## 2018-05-02 NOTE — Patient Outreach (Signed)
[facility]) Care Management  05/02/2018  ANEISHA SKYLES 03-Mar-1946 [id number]   Outreach with Mrs. Knope to confirm availability for follow up assessment. She reported doing well today. No ED visits or hospitalizations since previous discharge. She was agreeable to final assessment and case closure on next week. Encouraged to contact RNCM if needed prior to next week's visit.    PLAN Pending case closure on next week.   [place] [phone]

## 2018-05-03 DIAGNOSIS — E663 Overweight: Secondary | ICD-10-CM | POA: Diagnosis not present

## 2018-05-03 DIAGNOSIS — G4733 Obstructive sleep apnea (adult) (pediatric): Secondary | ICD-10-CM | POA: Diagnosis not present

## 2018-05-03 DIAGNOSIS — J159 Unspecified bacterial pneumonia: Secondary | ICD-10-CM | POA: Diagnosis not present

## 2018-05-03 DIAGNOSIS — J9601 Acute respiratory failure with hypoxia: Secondary | ICD-10-CM | POA: Diagnosis not present

## 2018-05-10 ENCOUNTER — Other Ambulatory Visit: Payer: Self-pay

## 2018-05-10 NOTE — Patient Outreach (Signed)
[place] [facility]) Care Management   05/10/2018  Virginia Huber 07-27-1945 [id number]  RANESHIA DERICK is an 72 y.o. female  Subjective:  Home visit/evaluation for case closure complete. Virginia Huber was alert and oriented x 3. No complaints of shortness of breath, pain or chest discomfort.   Objective:   BP (!) 142/78 (BP Location: Right Arm, Patient Position: Sitting, Cuff Size: Normal)   Pulse 61   Resp 16   Ht 1.6 m (_0 )   Wt 152 lb (68.9 kg)   SpO2 98%   BMI 26.93 kg/m   Review of Systems  Constitutional: Negative.   HENT: Negative.   Respiratory: Positive for cough. Negative for hemoptysis, sputum production, shortness of breath and wheezing.   Gastrointestinal: Negative.  Negative for abdominal pain, heartburn, nausea and vomiting.  Genitourinary: Negative.   Musculoskeletal: Negative.   Skin: Negative.   Neurological: Negative.     Physical Exam  Constitutional: She is oriented to person, place, and time. She appears well-developed.  Cardiovascular: Normal rate.  Respiratory: Effort normal and breath sounds normal.  GI: Soft. Bowel sounds are normal.  Neurological: She is alert and oriented to person, place, and time.  Skin: Skin is warm and dry.  Psychiatric: She has a normal mood and affect. Her behavior is normal. Judgment and thought content normal.    Encounter Medications:   Outpatient Encounter Medications as of 05/10/2018  Medication Sig Note  . acetaminophen (TYLENOL) 325 MG tablet Take 650 mg by mouth every 6 (six) hours as needed for headache.   [name] amiodarone (PACERONE) 200 MG tablet Take 1 tablet (200 mg total) by mouth daily.   [name] apixaban (ELIQUIS) 5 MG TABS tablet TAKE (1) TABLET BY MOUTH TWICE DAILY.   [name] atorvastatin (LIPITOR) 80 MG tablet TAKE ONE TABLET BY MOUTH EVERY DAY AT 6:00PM   . carvedilol (COREG) 6.25 MG tablet Take 1 tablet (6.25 mg total) by mouth 2 (two) times daily.   . Cholecalciferol (VITAMIN D) 2000 units  tablet Take 2,000 Units by mouth daily.    . clopidogrel (PLAVIX) 75 MG tablet TAKE ONE (1) TABLET BY MOUTH EVERY DAY   . diltiazem (CARDIZEM CD) 360 MG 24 hr capsule Take 1 capsule (360 mg total) by mouth daily.   . diphenhydramine-acetaminophen (TYLENOL PM EXTRA STRENGTH) 25-500 MG TABS tablet Take 1 tablet by mouth at bedtime as needed (Take as directed).    . furosemide (LASIX) 40 MG tablet Take 40 mg am, and 20 mg pm   . glucose blood (FREESTYLE LITE) test strip USE AS DIRECTED TWICE DAILY.   [name] insulin NPH-regular Human (NOVOLIN 70/30) (70-30) 100 UNIT/ML injection Inject 40 Units into the skin See admin instructions. Inject 25 units before breakfast and 25 units before supper  - only if pre-meal blood glucose is above 90 mg/dL. (Patient taking differently: Inject 25 Units into the skin See admin instructions. Inject 25 units before breakfast and 25 units before supper  - only if pre-meal blood glucose is above 90 mg/dL.)   . levothyroxine (SYNTHROID, LEVOTHROID) 150 MCG tablet Take 1 tablet (150 mcg total) by mouth daily before breakfast.   . losartan (COZAAR) 50 MG tablet Take 25 mg by mouth daily.   . metFORMIN (GLUCOPHAGE) 500 MG tablet TAKE ONE TABLET BY MOUTH TWICE A DAY WITH A MEAL   . nitroGLYCERIN (NITROSTAT) 0.4 MG SL tablet Place 1 tablet (0.4 mg total) under the tongue every 5 (five) minutes as needed for chest  pain. 12/15/2017: Found afib  . potassium chloride SA (K-DUR,KLOR-CON) 20 MEQ tablet Take 2 tablets (40 mEq total) by mouth daily.   [name] rOPINIRole (REQUIP) 0.5 MG tablet Take 0.5 mg by mouth at bedtime.  11/30/2014: .   No facility-administered encounter medications on file as of 05/10/2018.     Functional Status:   In your present state of health, do you have any difficulty performing the following activities: 12/17/2017 09/23/2017  Hearing? N N  Vision? N N  Difficulty concentrating or making decisions? N N  Walking or climbing stairs? N N  Dressing or bathing? N N  Doing  errands, shopping? N N  Some recent data might be hidden    Fall/Depression Screening:    Fall Risk  03/17/2018 12/31/2017 12/08/2017  Falls in the past year? - Yes Yes  Number falls in past yr: - 2 or more 2 or more  Injury with Fall? - No Yes  Risk Factor Category  (No Data) High Fall Risk High Fall Risk  Comment Patient reported no falls in the past 2 months. - -  Risk for fall due to : - History of fall(s);Other (Comment) -  Follow up - Falls prevention discussed Education provided;Falls prevention discussed   PHQ 2/9 Scores 05/10/2018 03/17/2018 12/31/2017 08/12/2017 05/10/2017 02/01/2017 02/01/2017  PHQ - 2 Score 0 0 0 0 0 2 0  PHQ- 9 Score - - - - - 5 -  Exception Documentation - - - - - - -    Assessment:   Home visit complete. Virginia Huber reported doing very well and has remained compliant with medications, daily glucose monitoring, daily weights and nutrition recommendations. No falls reported. Attends MD appointments as scheduled. No hospitalizations or ED visits since discharge in July. Virginia Huber denied urgent questions or concerns and was agreeable to case closure due to no further case management needs and all goals being met. No further outreach required at this time. Virginia Huber agreed to contact PCP or RNCM if her medical needs change and additional care management services are required.  PLAN Will notify MD and complete case closure.   [place] [phone]

## 2018-05-12 DIAGNOSIS — E113411 Type 2 diabetes mellitus with severe nonproliferative diabetic retinopathy with macular edema, right eye: Secondary | ICD-10-CM | POA: Diagnosis not present

## 2018-05-12 DIAGNOSIS — H34831 Tributary (branch) retinal vein occlusion, right eye, with macular edema: Secondary | ICD-10-CM | POA: Diagnosis not present

## 2018-05-12 DIAGNOSIS — H33301 Unspecified retinal break, right eye: Secondary | ICD-10-CM | POA: Diagnosis not present

## 2018-05-12 DIAGNOSIS — H43811 Vitreous degeneration, right eye: Secondary | ICD-10-CM | POA: Diagnosis not present

## 2018-05-17 ENCOUNTER — Other Ambulatory Visit: Payer: Self-pay

## 2018-05-17 DIAGNOSIS — I495 Sick sinus syndrome: Secondary | ICD-10-CM

## 2018-05-17 DIAGNOSIS — I5031 Acute diastolic (congestive) heart failure: Secondary | ICD-10-CM

## 2018-05-17 DIAGNOSIS — I214 Non-ST elevation (NSTEMI) myocardial infarction: Secondary | ICD-10-CM

## 2018-05-17 DIAGNOSIS — E1159 Type 2 diabetes mellitus with other circulatory complications: Secondary | ICD-10-CM

## 2018-05-17 NOTE — Patient Outreach (Signed)
[place] [facility]) Care Management  05/17/2018  LUARA FAYE 03/10/1946 [id number]    Call received from Mrs. Rexene Agent regarding [employer]. Stated she was aware of the pharmacy's [facility] location and informed that services were available to [facility] [employer] members. She requested assistance with determining her eligibility and is willing to drive to [facility] to obtain prescriptions if she is approved. Agreeable to follow-up discussion with [place] Manager [phone]

## 2018-06-02 DIAGNOSIS — J159 Unspecified bacterial pneumonia: Secondary | ICD-10-CM | POA: Diagnosis not present

## 2018-06-02 DIAGNOSIS — E663 Overweight: Secondary | ICD-10-CM | POA: Diagnosis not present

## 2018-06-02 DIAGNOSIS — J9601 Acute respiratory failure with hypoxia: Secondary | ICD-10-CM | POA: Diagnosis not present

## 2018-06-02 DIAGNOSIS — G4733 Obstructive sleep apnea (adult) (pediatric): Secondary | ICD-10-CM | POA: Diagnosis not present

## 2018-06-09 DIAGNOSIS — H33301 Unspecified retinal break, right eye: Secondary | ICD-10-CM | POA: Diagnosis not present

## 2018-06-09 DIAGNOSIS — H348322 Tributary (branch) retinal vein occlusion, left eye, stable: Secondary | ICD-10-CM | POA: Diagnosis not present

## 2018-06-09 DIAGNOSIS — E119 Type 2 diabetes mellitus without complications: Secondary | ICD-10-CM | POA: Diagnosis not present

## 2018-06-09 DIAGNOSIS — E113411 Type 2 diabetes mellitus with severe nonproliferative diabetic retinopathy with macular edema, right eye: Secondary | ICD-10-CM | POA: Diagnosis not present

## 2018-06-09 DIAGNOSIS — H34831 Tributary (branch) retinal vein occlusion, right eye, with macular edema: Secondary | ICD-10-CM | POA: Diagnosis not present

## 2018-06-09 DIAGNOSIS — H43811 Vitreous degeneration, right eye: Secondary | ICD-10-CM | POA: Diagnosis not present

## 2018-06-10 ENCOUNTER — Other Ambulatory Visit (HOSPITAL_COMMUNITY): Payer: Self-pay | Admitting: Obstetrics and Gynecology

## 2018-06-10 DIAGNOSIS — Z1231 Encounter for screening mammogram for malignant neoplasm of breast: Secondary | ICD-10-CM

## 2018-06-20 ENCOUNTER — Encounter: Payer: Self-pay | Admitting: Neurology

## 2018-06-23 ENCOUNTER — Ambulatory Visit (HOSPITAL_COMMUNITY)
Admission: RE | Admit: 2018-06-23 | Discharge: 2018-06-23 | Disposition: A | Discharge status: Home / Self Care | Payer: PPO | Source: Ambulatory Visit | Attending: Obstetrics and Gynecology | Admitting: Obstetrics and Gynecology

## 2018-06-23 DIAGNOSIS — Z1231 Encounter for screening mammogram for malignant neoplasm of breast: Secondary | ICD-10-CM | POA: Diagnosis not present

## 2018-06-27 ENCOUNTER — Ambulatory Visit: Payer: PPO | Admitting: Neurology

## 2018-06-27 ENCOUNTER — Ambulatory Visit (INDEPENDENT_AMBULATORY_CARE_PROVIDER_SITE_OTHER): Payer: PPO

## 2018-06-27 DIAGNOSIS — I495 Sick sinus syndrome: Secondary | ICD-10-CM

## 2018-06-28 LAB — CUP PACEART REMOTE DEVICE CHECK
Battery Remaining Longevity: 100 mo
Battery Voltage: 3.02 V
Brady Statistic AP VP Percent: 0.05 %
Brady Statistic AS VP Percent: 0.01 %
Brady Statistic AS VS Percent: 2.64 %
Brady Statistic RA Percent Paced: 97.34 %
Brady Statistic RV Percent Paced: 0.06 %
Date Time Interrogation Session: 20200106153720
Implantable Lead Implant Date: 20180104
Implantable Lead Implant Date: 20180104
Implantable Lead Location: 753859
Implantable Lead Location: 753860
Implantable Lead Model: 5076
Implantable Pulse Generator Implant Date: 20180104
Lead Channel Impedance Value: 323 Ohm
Lead Channel Impedance Value: 380 Ohm
Lead Channel Impedance Value: 380 Ohm
Lead Channel Impedance Value: 494 Ohm
Lead Channel Pacing Threshold Amplitude: 0.625 V
Lead Channel Pacing Threshold Amplitude: 1.375 V
Lead Channel Pacing Threshold Pulse Width: 0.4 ms
Lead Channel Pacing Threshold Pulse Width: 0.4 ms
Lead Channel Sensing Intrinsic Amplitude: 1 mV
Lead Channel Sensing Intrinsic Amplitude: 1 mV
Lead Channel Sensing Intrinsic Amplitude: 14.875 mV
Lead Channel Setting Pacing Amplitude: 2 V
Lead Channel Setting Pacing Amplitude: 2.75 V
Lead Channel Setting Pacing Pulse Width: 0.4 ms
Lead Channel Setting Sensing Sensitivity: 2.8 mV
MDC IDC MSMT LEADCHNL RV SENSING INTR AMPL: 14.875 mV
MDC IDC STAT BRADY AP VS PERCENT: 97.3 %

## 2018-06-28 NOTE — Progress Notes (Signed)
Remote pacemaker transmission.   

## 2018-06-29 DIAGNOSIS — E119 Type 2 diabetes mellitus without complications: Secondary | ICD-10-CM | POA: Diagnosis not present

## 2018-06-29 DIAGNOSIS — Z6828 Body mass index (BMI) 28.0-28.9, adult: Secondary | ICD-10-CM | POA: Diagnosis not present

## 2018-06-29 DIAGNOSIS — E663 Overweight: Secondary | ICD-10-CM | POA: Diagnosis not present

## 2018-06-29 DIAGNOSIS — E039 Hypothyroidism, unspecified: Secondary | ICD-10-CM | POA: Diagnosis not present

## 2018-06-29 DIAGNOSIS — Z1389 Encounter for screening for other disorder: Secondary | ICD-10-CM | POA: Diagnosis not present

## 2018-06-29 DIAGNOSIS — E785 Hyperlipidemia, unspecified: Secondary | ICD-10-CM | POA: Diagnosis not present

## 2018-06-29 DIAGNOSIS — I10 Essential (primary) hypertension: Secondary | ICD-10-CM | POA: Diagnosis not present

## 2018-06-29 DIAGNOSIS — I2510 Atherosclerotic heart disease of native coronary artery without angina pectoris: Secondary | ICD-10-CM | POA: Diagnosis not present

## 2018-07-03 DIAGNOSIS — G4733 Obstructive sleep apnea (adult) (pediatric): Secondary | ICD-10-CM | POA: Diagnosis not present

## 2018-07-03 DIAGNOSIS — E663 Overweight: Secondary | ICD-10-CM | POA: Diagnosis not present

## 2018-07-03 DIAGNOSIS — J159 Unspecified bacterial pneumonia: Secondary | ICD-10-CM | POA: Diagnosis not present

## 2018-07-03 DIAGNOSIS — J9601 Acute respiratory failure with hypoxia: Secondary | ICD-10-CM | POA: Diagnosis not present

## 2018-07-05 ENCOUNTER — Other Ambulatory Visit: Payer: Self-pay | Admitting: Cardiovascular Disease

## 2018-07-14 DIAGNOSIS — H3561 Retinal hemorrhage, right eye: Secondary | ICD-10-CM | POA: Diagnosis not present

## 2018-07-14 DIAGNOSIS — H34831 Tributary (branch) retinal vein occlusion, right eye, with macular edema: Secondary | ICD-10-CM | POA: Diagnosis not present

## 2018-07-14 DIAGNOSIS — H33321 Round hole, right eye: Secondary | ICD-10-CM | POA: Diagnosis not present

## 2018-07-14 DIAGNOSIS — H43811 Vitreous degeneration, right eye: Secondary | ICD-10-CM | POA: Diagnosis not present

## 2018-08-03 DIAGNOSIS — G4733 Obstructive sleep apnea (adult) (pediatric): Secondary | ICD-10-CM | POA: Diagnosis not present

## 2018-08-03 DIAGNOSIS — J159 Unspecified bacterial pneumonia: Secondary | ICD-10-CM | POA: Diagnosis not present

## 2018-08-03 DIAGNOSIS — J9601 Acute respiratory failure with hypoxia: Secondary | ICD-10-CM | POA: Diagnosis not present

## 2018-08-03 DIAGNOSIS — E663 Overweight: Secondary | ICD-10-CM | POA: Diagnosis not present

## 2018-08-16 ENCOUNTER — Encounter: Payer: Self-pay | Admitting: Cardiology

## 2018-08-16 ENCOUNTER — Ambulatory Visit: Payer: PPO | Admitting: Cardiology

## 2018-08-16 VITALS — BP 120/62 | HR 65 | Ht 63.0 in | Wt 149.0 lb

## 2018-08-16 DIAGNOSIS — I480 Paroxysmal atrial fibrillation: Secondary | ICD-10-CM

## 2018-08-16 MED ORDER — AMIODARONE HCL 200 MG PO TABS: 100.0000 mg | ORAL_TABLET | Freq: Every day | ORAL | 1 refills | Status: DC

## 2018-08-16 NOTE — Patient Instructions (Addendum)
Medication Instructions:  Your physician has recommended you make the following change in your medication:  1. DECREASE Amiodarone to 100 mg daily  * If you need a refill on your cardiac medications before your next appointment, please call your pharmacy.   Labwork: None ordered  Testing/Procedures: None ordered  Follow-Up: Your physician recommends that you schedule a follow-up appointment in: 2 weeks for nurse visit EKG.  Your physician wants you to follow-up in: 6 months  with Dr. Curt Bears.  You will receive a reminder letter in the mail two months in advance. If you don't receive a letter, please call our office to schedule the follow-up appointment.  Thank you for choosing [facility]!!   Trinidad Curet, RN [phone]

## 2018-08-16 NOTE — Progress Notes (Signed)
Electrophysiology Office Note   Date:  08/16/2018   ID:  Daksha, Koone 07-22-1945, MRN [id number]  PCP:  Sharilyn Sites, MD  Cardiologist:  Bronson Ing Primary Electrophysiologist:  Will Meredith Leeds, MD    No chief complaint on file.    History of Present Illness: Virginia Huber is a 73 y.o. female who presents today for electrophysiology evaluation.   She has a history of CAD status post MI and PCI of the OM1, with an an STEMI 03/2015 status post DES to the circumflex, an STEMI 07/2015 with PCI to the RCA and paroxysmal atrial fibrillation during that admission. She is on Eliquis. Sleep study showed mild sleep apnea. He was in the hospital on 12/17 and was found to be in A. fib with RVR. She converted to sinus rhythm after arrival. He was readmitted to the hospital on 12/23 and was noted to be in atrial flutter with rapid rates at that time. She had junctional rhythm and near-syncope after conversion to sinus rhythm. She had a postconversion pause of 5.6 seconds. Pacemaker placed 06/25/16 with lead revision 06/26/16. Device implant was complicated by pericardial effusion and tamponade requiring emergent pericardiocentesis.  Today, denies symptoms of palpitations, chest pain, shortness of breath, orthopnea, PND, lower extremity edema, claudication, dizziness, presyncope, syncope, bleeding, or neurologic sequela. The patient is tolerating medications without difficulties.  Overall she is doing well.  Her main complaint today is of tremors and shakiness.  She did have a fall.  She attributes this to her amiodarone.  She otherwise has not noted any arrhythmias.  Past Medical History:  Diagnosis Date  . Arthritis   . Atrial fibrillation and flutter ([place])    a. h/o PAF/flutter during admission in 2013 for PNA. b. PAF during adm for NSTEMI 07/2015, subsequent paroxysms since then.  . B12 deficiency anemia   . Cardiac tamponade 06/2016  . CHF (congestive heart failure) ([place])   . Coronary  artery disease 11/30/2014   a. remote MI. b. h/o PTCA with scoring balloon to OM1 11/2014. c. NSTEMI 03/2015 s/p DES to prox-mid Cx. d. NSTEMI 07/2015 s/p scoring balloon/PTCA/DES to dRCA with PAF during that admission  . Cutaneous lupus erythematosus   . GERD (gastroesophageal reflux disease)   . Heart block   . History of blood transfusion 1980's   2nd surgical procedures  . HTN (hypertension)   . Hypercholesteremia   . Hypothyroidism   . Myocardial infarction ([place]) 02/2012  . Ovarian tumor   . PAD (peripheral artery disease) ([place])    a. s/p LE angio 2015; followed by Dr. Fletcher Anon - managed medically.  . Pain with urination 05/08/2015  . Paroxysmal atrial flutter ([place])   . Pericardial effusion    a. 06/2016 after ppm - s/p pericardiocentesis.  . Superficial fungus infection of skin 06/29/2013  . Tachy-brady syndrome ([place])    a. s/p Medtronic PPM 06/2016, c/b lead perf/pericardial effusion.  [name] TIA (transient ischemic attack) 08/2001; ~ 2006  . Type II diabetes mellitus ([place])   . UTI (urinary tract infection) 05/08/2013   Past Surgical History:  Procedure Laterality Date  . ABDOMINAL AORTAGRAM N/A 01/03/2014   Procedure: ABDOMINAL [name];  Surgeon: Wellington Hampshire, MD;  Location: [facility];  Service: Cardiovascular;  Laterality: N/A;  . ABDOMINAL HYSTERECTOMY  1972   "partial"  . APPENDECTOMY  1970's  . CARDIAC CATHETERIZATION  2008   Tiny OM-2 with 90% narrowing. Med tx.  [name] CARDIAC CATHETERIZATION N/A 11/30/2014   Procedure: Left  Heart Cath and Coronary Angiography;  Surgeon: Troy Sine, MD; LAD 20%, CFX 50%, OM1 95%, right PLB 30%, LV normal   . CARDIAC CATHETERIZATION N/A 11/30/2014   Procedure: Coronary Balloon Angioplasty;  Surgeon: Troy Sine, MD;  Angiosculpt scoring balloon and PTCA to the OM1 reducing stenosis from 95% to less than 10%  . CARDIAC CATHETERIZATION N/A 04/03/2015   Procedure: Left Heart Cath and Coronary Angiography;  Surgeon: Jolaine Artist, MD; dLAD  50%, CFX 90%, OM1 100%, PLA 15%, LVEDP 13    . CARDIAC CATHETERIZATION N/A 04/03/2015   Procedure: Coronary Stent Intervention;  Surgeon: Sherren Mocha, MD; 3.0x18 mm Xience DES to the CFX    . CARDIAC CATHETERIZATION N/A 08/02/2015   Procedure: Left Heart Cath and Coronary Angiography;  Surgeon: Troy Sine, MD;  Location: [facility] CV [facility];  Service: Cardiovascular;  Laterality: N/A;  . CARDIAC CATHETERIZATION N/A 08/02/2015   Procedure: Coronary Stent Intervention;  Surgeon: Troy Sine, MD;  Location: [facility] CV [facility];  Service: Cardiovascular;  Laterality: N/A;  . CARDIAC CATHETERIZATION N/A 06/25/2016   Procedure: Pericardiocentesis;  Surgeon: Will Meredith Leeds, MD;  Location: [facility] CV [facility];  Service: Cardiovascular;  Laterality: N/A;  . cardiac stents    . CARDIOVERSION N/A 12/15/2017   Procedure: CARDIOVERSION;  Surgeon: Acie Fredrickson Wonda Cheng, MD;  Location: [facility] ENDOSCOPY;  Service: Cardiovascular;  Laterality: N/A;  . CHOLECYSTECTOMY OPEN  1990's  . COLONOSCOPY  2005   Dr. Laural Golden: pancolonic divericula, polyp, path unknown currently  . COLONOSCOPY  2012   Dr. Oneida Alar: Normal TI, scattered diverticula in entire colon, small internal hemorrhoids, normal colon biopsies. Colonoscopy in 5-10 years.   . COLOSTOMY  05/1979  . COLOSTOMY REVERSAL  11/1979  . EP IMPLANTABLE DEVICE N/A 06/25/2016   Procedure: Lead Revision/Repair;  Surgeon: Will Meredith Leeds, MD;  Location: [facility] CV LAB;  Service: Cardiovascular;  Laterality: N/A;  . EP IMPLANTABLE DEVICE N/A 06/25/2016   Procedure: Pacemaker Implant;  Surgeon: Will Meredith Leeds, MD;  Location: [facility] CV LAB;  Service: Cardiovascular;  Laterality: N/A;  . EXCISIONAL HEMORRHOIDECTOMY  1970's  . EYE SURGERY Left 2000   "branch vein occlusion"  . EYE SURGERY Left ~ 2001   "smoothed out wrinkle"  . LEFT OOPHORECTOMY  05/1979   nicked bowel, peritonitis, colostomy; colostomy reversed 1981   . LOWER EXTREMITY ANGIOGRAM N/A  01/03/2014   Procedure: LOWER EXTREMITY ANGIOGRAM;  Surgeon: Wellington Hampshire, MD;  Location: [facility] CATH LAB;  Service: Cardiovascular;  Laterality: N/A;  . Nuclear med stress test  10/2011   Small area of mild ischemia inferoapically.  [name] PARTIAL HYSTERECTOMY  1970's   left ovaries, then ovaries removed later due tumors   . RIGHT OOPHORECTOMY  1970's     Current Outpatient Medications  Medication Sig Dispense Refill  . acetaminophen (TYLENOL) 325 MG tablet Take 650 mg by mouth every 6 (six) hours as needed for headache.    [name] amiodarone (PACERONE) 200 MG tablet Take 1 tablet (200 mg total) by mouth daily. 90 tablet 3  . apixaban (ELIQUIS) 5 MG TABS tablet TAKE (1) TABLET BY MOUTH TWICE DAILY. 180 tablet 3  . atorvastatin (LIPITOR) 80 MG tablet TAKE ONE TABLET BY MOUTH EVERY DAY AT 6:00PM 90 tablet 1  . carvedilol (COREG) 6.25 MG tablet Take 1 tablet (6.25 mg total) by mouth 2 (two) times daily. 180 tablet 1  . Cholecalciferol (VITAMIN D) 2000 units tablet Take 2,000 Units by mouth daily.     [name]  clopidogrel (PLAVIX) 75 MG tablet TAKE ONE (1) TABLET BY MOUTH EVERY DAY 90 tablet 3  . diltiazem (CARDIZEM CD) 360 MG 24 hr capsule Take 1 capsule (360 mg total) by mouth daily. 90 capsule 3  . diphenhydramine-acetaminophen (TYLENOL PM EXTRA STRENGTH) 25-500 MG TABS tablet Take 1 tablet by mouth at bedtime as needed (Take as directed).     . furosemide (LASIX) 40 MG tablet TAKE ONE TABLET (40MG  TOTAL) BY MOUTH TWO TIMES DAILY 180 tablet 4  . glucose blood (FREESTYLE LITE) test strip USE AS DIRECTED TWICE DAILY. 100 each 5  . insulin NPH-regular Human (NOVOLIN 70/30) (70-30) 100 UNIT/ML injection Inject 40 Units into the skin See admin instructions. Inject 25 units before breakfast and 25 units before supper  - only if pre-meal blood glucose is above 90 mg/dL. (Patient taking differently: Inject 25 Units into the skin See admin instructions. Inject 20 units before breakfast and 10 units before supper  - only  if pre-meal blood glucose is above 90 mg/dL.) 10 mL 11  . levothyroxine (SYNTHROID, LEVOTHROID) 150 MCG tablet Take 1 tablet (150 mcg total) by mouth daily before breakfast. 30 tablet 1  . losartan (COZAAR) 50 MG tablet Take 25 mg by mouth daily.    . metFORMIN (GLUCOPHAGE) 500 MG tablet TAKE ONE TABLET BY MOUTH TWICE A DAY WITH A MEAL 180 tablet 0  . nitroGLYCERIN (NITROSTAT) 0.4 MG SL tablet Place 1 tablet (0.4 mg total) under the tongue every 5 (five) minutes as needed for chest pain. 25 tablet 0  . potassium chloride SA (K-DUR,KLOR-CON) 20 MEQ tablet Take 2 tablets (40 mEq total) by mouth daily. 60 tablet 1  . rOPINIRole (REQUIP) 0.5 MG tablet Take 0.5 mg by mouth at bedtime.   0   No current facility-administered medications for this visit.     Allergies:   Penicillins and Percocet [oxycodone-acetaminophen]   Social History:  The patient  reports that she has never smoked. She has never used smokeless tobacco. She reports that she does not drink alcohol or use drugs.   Family History:  The patient's family history includes Diabetes in her brother; Heart disease in her brother, brother, father, mother, and sister; Lupus in her daughter; Thyroid disease in her brother and brother.   ROS:  Please see the history of present illness.   Otherwise, review of systems is positive for none.   All other systems are reviewed and negative.   PHYSICAL EXAM: VS:  BP 120/62   Pulse 65   Ht 5\' 3"  (1.6 m)   Wt 149 lb (67.6 kg)   BMI 26.39 kg/m  , BMI Body mass index is 26.39 kg/m. GEN: Well nourished, well developed, in no acute distress  HEENT: normal  Neck: no JVD, carotid bruits, or masses Cardiac: RRR; no murmurs, rubs, or gallops,no edema  Respiratory:  clear to auscultation bilaterally, normal work of breathing GI: soft, nontender, nondistended, + BS MS: no deformity or atrophy  Skin: warm and dry, device site well healed Neuro:  Strength and sensation are intact Psych: euthymic mood,  full affect  EKG:  EKG is ordered today. Personal review of the ekg ordered shows atrial paced, prolonged QTC, rate 65  Personal review of the device interrogation today. Results in [facility]: 12/17/2017: ALT 107; B Natriuretic Peptide 677.0; Hemoglobin 10.9; Platelets 234; TSH 0.059 12/31/2017: BUN 18; Creat 1.11; Potassium 4.6; Sodium 138    Lipid Panel     Component Value Date/Time  CHOL 166 08/05/2017 0807   TRIG 160 (H) 08/05/2017 0807   HDL 43 (L) 08/05/2017 0807   CHOLHDL 3.9 08/05/2017 0807   VLDL NOT CALC 04/14/2016 0910   LDLCALC 96 08/05/2017 0807     Wt Readings from Last 3 Encounters:  08/16/18 149 lb (67.6 kg)  05/10/18 152 lb (68.9 kg)  04/27/18 153 lb (69.4 kg)      Other studies Reviewed: Additional studies/ records that were reviewed today include: TTE 08/04/17 Review of the above records today demonstrates:  - Left ventricle: The cavity size was normal. Wall thickness was   increased in a pattern of moderate LVH. Systolic function was   normal. The estimated ejection fraction was in the range of 55%   to 60%. Wall motion was normal; there were no regional wall   motion abnormalities. Features are consistent with a pseudonormal   left ventricular filling pattern, with concomitant abnormal   relaxation and increased filling pressure (grade 2 diastolic   dysfunction). Doppler parameters are consistent with high   ventricular filling pressure. - Left atrium: The atrium was severely dilated. - Right ventricle: Pacer wire or catheter noted in right ventricle. - Right atrium: Pacer wire or catheter noted in right atrium.  Cath  08/02/15  1st RPLB lesion, 50% stenosed.  Dist RCA lesion, 30% stenosed.  Ost 1st Mrg to 1st Mrg lesion, 100% stenosed.  Ost LAD lesion, 40% stenosed.  Mid LAD lesion, 40% stenosed.  Post Atrio lesion, 90% stenosed. Post intervention, there is a 0% residual stenosis.  The left ventricular systolic function is  normal.  Mid RCA lesion, 20% stenosed.   Normal LV function without focal segmental wall motion abnormalities and ejection fraction of 55%.  Successful percutaneous cardiac intervention to the distal RCA treated with Angiosculpt scoring balloon, PTCA, and ultimate stenting with a 2.58 mm Xience Alpine DES stent postdilated to 2.51 mm with a 90% stenosis reduced to 0% and no change in the ostial PDA narrowing.  ASSESSMENT AND PLAN:  1.  Paroxysmal atrial fibrillation with tachybradycardia syndrome: This post Medtronic dual-chamber pacemaker.  Device functioning appropriately.  She is on amiodarone with a prolonged QTC and tremors.  Will decrease amiodarone to 100 mg daily and get an EKG in 2 weeks.  This patients CHA2DS2-VASc Score and unadjusted Ischemic Stroke Rate (% per year) is equal to 11.2 % stroke rate/year from a score of 7  Above score calculated as 1 point each if present [CHF, HTN, DM, Vascular=MI/PAD/Aortic Plaque, Age if 65-74, or Female] Above score calculated as 2 points each if present [Age > 75, or Stroke/TIA/TE]   2. Coronary artery disease: No current chest pain  3. Hypertension: Well-controlled  Current medicines are reviewed at length with the patient today.   The patient does not have concerns regarding her medicines.  The following changes were made today: None  Labs/ tests ordered today include:  Orders Placed This Encounter  Procedures  . EKG 12-Lead     Disposition:   FU with Will Camnitz 6 months  Signed, Will Meredith Leeds, MD  08/16/2018 3:37 PM     [address] [place] [address] [phone] (office) [phone] (fax)

## 2018-08-17 LAB — CUP PACEART INCLINIC DEVICE CHECK
Implantable Lead Implant Date: 20180104
Implantable Lead Location: 753859
Implantable Lead Location: 753860
Implantable Lead Model: 5076
Implantable Lead Model: 5076
Implantable Pulse Generator Implant Date: 20180104
MDC IDC LEAD IMPLANT DT: 20180104
MDC IDC SESS DTM: 20200226080312

## 2018-08-18 DIAGNOSIS — H35041 Retinal micro-aneurysms, unspecified, right eye: Secondary | ICD-10-CM | POA: Diagnosis not present

## 2018-08-18 DIAGNOSIS — H33301 Unspecified retinal break, right eye: Secondary | ICD-10-CM | POA: Diagnosis not present

## 2018-08-18 DIAGNOSIS — E113411 Type 2 diabetes mellitus with severe nonproliferative diabetic retinopathy with macular edema, right eye: Secondary | ICD-10-CM | POA: Diagnosis not present

## 2018-08-18 DIAGNOSIS — H43811 Vitreous degeneration, right eye: Secondary | ICD-10-CM | POA: Diagnosis not present

## 2018-08-23 ENCOUNTER — Encounter: Payer: PPO | Admitting: Cardiology

## 2018-08-24 ENCOUNTER — Encounter (HOSPITAL_COMMUNITY): Payer: Self-pay | Admitting: Emergency Medicine

## 2018-08-24 ENCOUNTER — Emergency Department (HOSPITAL_COMMUNITY)
Admission: EM | Admit: 2018-08-24 | Discharge: 2018-08-25 | Disposition: A | Discharge status: Home / Self Care | Payer: PPO | Attending: Emergency Medicine | Admitting: Emergency Medicine

## 2018-08-24 ENCOUNTER — Other Ambulatory Visit: Payer: Self-pay

## 2018-08-24 ENCOUNTER — Emergency Department (HOSPITAL_COMMUNITY): Payer: PPO

## 2018-08-24 DIAGNOSIS — R05 Cough: Secondary | ICD-10-CM

## 2018-08-24 DIAGNOSIS — J189 Pneumonia, unspecified organism: Secondary | ICD-10-CM | POA: Diagnosis not present

## 2018-08-24 DIAGNOSIS — N183 Chronic kidney disease, stage 3 (moderate): Secondary | ICD-10-CM | POA: Diagnosis not present

## 2018-08-24 DIAGNOSIS — E119 Type 2 diabetes mellitus without complications: Secondary | ICD-10-CM | POA: Insufficient documentation

## 2018-08-24 DIAGNOSIS — Z79899 Other long term (current) drug therapy: Secondary | ICD-10-CM | POA: Insufficient documentation

## 2018-08-24 DIAGNOSIS — R9431 Abnormal electrocardiogram [ECG] [EKG]: Secondary | ICD-10-CM | POA: Diagnosis not present

## 2018-08-24 DIAGNOSIS — I4581 Long QT syndrome: Secondary | ICD-10-CM | POA: Diagnosis not present

## 2018-08-24 DIAGNOSIS — I5020 Unspecified systolic (congestive) heart failure: Secondary | ICD-10-CM | POA: Insufficient documentation

## 2018-08-24 DIAGNOSIS — I130 Hypertensive heart and chronic kidney disease with heart failure and stage 1 through stage 4 chronic kidney disease, or unspecified chronic kidney disease: Secondary | ICD-10-CM | POA: Insufficient documentation

## 2018-08-24 DIAGNOSIS — R059 Cough, unspecified: Secondary | ICD-10-CM

## 2018-08-24 DIAGNOSIS — I25710 Atherosclerosis of autologous vein coronary artery bypass graft(s) with unstable angina pectoris: Secondary | ICD-10-CM | POA: Insufficient documentation

## 2018-08-24 DIAGNOSIS — R0602 Shortness of breath: Secondary | ICD-10-CM | POA: Diagnosis not present

## 2018-08-24 LAB — CBC WITH DIFFERENTIAL/PLATELET
Abs Immature Granulocytes: 0.03 K/uL (ref 0.00–0.07)
Basophils Absolute: 0 K/uL (ref 0.0–0.1)
Basophils Relative: 0 %
Eosinophils Absolute: 0.2 K/uL (ref 0.0–0.5)
Eosinophils Relative: 2 %
HCT: 40.9 % (ref 36.0–46.0)
Hemoglobin: 12.5 g/dL (ref 12.0–15.0)
Immature Granulocytes: 0 %
Lymphocytes Relative: 15 %
Lymphs Abs: 1.1 K/uL (ref 0.7–4.0)
MCH: 28.2 pg (ref 26.0–34.0)
MCHC: 30.6 g/dL (ref 30.0–36.0)
MCV: 92.1 fL (ref 80.0–100.0)
Monocytes Absolute: 0.6 K/uL (ref 0.1–1.0)
Monocytes Relative: 8 %
Neutro Abs: 5.4 K/uL (ref 1.7–7.7)
Neutrophils Relative %: 75 %
Platelets: 241 K/uL (ref 150–400)
RBC: 4.44 MIL/uL (ref 3.87–5.11)
RDW: 14.9 % (ref 11.5–15.5)
WBC: 7.3 K/uL (ref 4.0–10.5)
nRBC: 0 % (ref 0.0–0.2)

## 2018-08-24 LAB — BASIC METABOLIC PANEL
Anion gap: 11 (ref 5–15)
BUN: 25 mg/dL — ABNORMAL HIGH (ref 8–23)
CO2: 25 mmol/L (ref 22–32)
Calcium: 8.7 mg/dL — ABNORMAL LOW (ref 8.9–10.3)
Chloride: 103 mmol/L (ref 98–111)
Creatinine, Ser: 1.66 mg/dL — ABNORMAL HIGH (ref 0.44–1.00)
GFR calc Af Amer: 35 mL/min — ABNORMAL LOW (ref >60)
GFR calc non Af Amer: 30 mL/min — ABNORMAL LOW (ref >60)
Glucose, Bld: 146 mg/dL — ABNORMAL HIGH (ref 70–99)
Potassium: 3.5 mmol/L (ref 3.5–5.1)
Sodium: 139 mmol/L (ref 135–145)

## 2018-08-24 LAB — BRAIN NATRIURETIC PEPTIDE: B NATRIURETIC PEPTIDE 5: 149 pg/mL — AB (ref 0.0–100.0)

## 2018-08-24 LAB — TROPONIN I: Troponin I: <0.03 ng/mL (ref <0.03)

## 2018-08-24 LAB — INFLUENZA PANEL BY PCR (TYPE A & B)
Influenza A By PCR: NEGATIVE
Influenza B By PCR: NEGATIVE

## 2018-08-24 MED ORDER — BENZONATATE 100 MG PO CAPS: 100.0000 mg | ORAL_CAPSULE | Freq: Three times a day (TID) | ORAL | 0 refills | Status: DC

## 2018-08-24 MED ORDER — DOXYCYCLINE HYCLATE 100 MG PO CAPS: 100.0000 mg | ORAL_CAPSULE | Freq: Two times a day (BID) | ORAL | 0 refills | Status: AC

## 2018-08-24 MED ORDER — IPRATROPIUM-ALBUTEROL 0.5-2.5 (3) MG/3ML IN SOLN
3.0000 mL | Freq: Once | RESPIRATORY_TRACT | Status: AC
  Administered 2018-08-24: 3 mL via RESPIRATORY_TRACT
  Filled 2018-08-24: qty 3

## 2018-08-24 NOTE — ED Notes (Signed)
Notified Jamie RRT of neb treatment.

## 2018-08-24 NOTE — Discharge Instructions (Signed)
We believe that your symptoms are caused today by pneumonia, an infection in your lung(s).  Fortunately you should start to improve quickly after taking your antibiotics.  Please take the full course of antibiotics as prescribed and drink plenty of fluids.   ° °Follow up with your doctor within 1-2 days.  If you develop any new or worsening symptoms, including but not limited to fever in spite of taking over-the-counter ibuprofen and/or Tylenol, persistent vomiting, worsening shortness of breath, or other symptoms that concern you, please return to the Emergency Department immediately.  ° ° °Pneumonia °Pneumonia is an infection of the lungs.  °CAUSES °Pneumonia may be caused by bacteria or a virus. Usually, these infections are caused by breathing infectious particles into the lungs (respiratory tract). °SIGNS AND SYMPTOMS  °Cough. °Fever. °Chest pain. °Increased rate of breathing. °Wheezing. °Mucus production. °DIAGNOSIS  °If you have the common symptoms of pneumonia, your health care provider will typically confirm the diagnosis with a chest X-ray. The X-ray will show an abnormality in the lung (pulmonary infiltrate) if you have pneumonia. Other tests of your blood, urine, or sputum may be done to find the specific cause of your pneumonia. Your health care provider may also do tests (blood gases or pulse oximetry) to see how well your lungs are working. °TREATMENT  °Some forms of pneumonia may be spread to other people when you cough or sneeze. You may be asked to wear a mask before and during your exam. Pneumonia that is caused by bacteria is treated with antibiotic medicine. Pneumonia that is caused by the influenza virus may be treated with an antiviral medicine. Most other viral infections must run their course. These infections will not respond to antibiotics.  °HOME CARE INSTRUCTIONS  °Cough suppressants may be used if you are losing too much rest. However, coughing protects you by clearing your lungs. You  should avoid using cough suppressants if you can. °Your health care provider may have prescribed medicine if he or she thinks your pneumonia is caused by bacteria or influenza. Finish your medicine even if you start to feel better. °Your health care provider may also prescribe an expectorant. This loosens the mucus to be coughed up. °Take medicines only as directed by your health care provider. °Do not smoke. Smoking is a common cause of bronchitis and can contribute to pneumonia. If you are a smoker and continue to smoke, your cough may last several weeks after your pneumonia has cleared. °A cold steam vaporizer or humidifier in your room or home may help loosen mucus. °Coughing is often worse at night. Sleeping in a semi-upright position in a recliner or using a couple pillows under your head will help with this. °Get rest as you feel it is needed. Your body will usually let you know when you need to rest. °PREVENTION °A pneumococcal shot (vaccine) is available to prevent a common bacterial cause of pneumonia. This is usually suggested for: °People over 65 years old. °Patients on chemotherapy. °People with chronic lung problems, such as bronchitis or emphysema. °People with immune system problems. °If you are over 65 or have a high risk condition, you may receive the pneumococcal vaccine if you have not received it before. In some countries, a routine influenza vaccine is also recommended. This vaccine can help prevent some cases of pneumonia. You may be offered the influenza vaccine as part of your care. °If you smoke, it is time to quit. You may receive instructions on how to stop smoking. Your   health care provider can provide medicines and counseling to help you quit. °SEEK MEDICAL CARE IF: °You have a fever. °SEEK IMMEDIATE MEDICAL CARE IF:  °Your illness becomes worse. This is especially true if you are elderly or weakened from any other disease. °You cannot control your cough with suppressants and are losing  sleep. °You begin coughing up blood. °You develop pain which is getting worse or is uncontrolled with medicines. °Any of the symptoms which initially brought you in for treatment are getting worse rather than better. °You develop shortness of breath or chest pain. °MAKE SURE YOU:  °Understand these instructions. °Will watch your condition. °Will get help right away if you are not doing well or get worse. °Document Released: 06/08/2005 Document Revised: 10/23/2013 Document Reviewed: 08/28/2010 °[employer]® Patient Information ©2015 [employer]. This information is not intended to replace advice given to you by your health care provider. Make sure you discuss any questions you have with your health care provider. ° ° ° °

## 2018-08-24 NOTE — ED Notes (Signed)
Pt ambulated around nurses station without assistance, pt maintained between 97-99% O2 saturation on RA, pt denies SOB

## 2018-08-24 NOTE — ED Triage Notes (Signed)
Pt states shes had a cough for 2 weeks that's progressively gotten worse. Pt states she has chf and feels like she has fluid on her lung.

## 2018-08-24 NOTE — ED Provider Notes (Signed)
Emergency Department Provider Note   I have reviewed the triage vital signs and the nursing notes.   HISTORY  Chief Complaint Cough   HPI Virginia Huber is a 73 y.o. female with PMH of A-fib, CHF, GERD, HTN, and HLD presents to the emergency department for evaluation of cough and congestion symptoms worsening over the past 2 weeks.  Patient is developed some mild shortness of breath and felt she might have been fluid overloaded.  She states she is not having swelling in her lower extremities.  She denies any chest pain.  No sick contacts or international travel.  Is any fevers or shaking chills.  No hemoptysis.  She has been compliant with her diuretics.   Past Medical History:  Diagnosis Date  . Arthritis   . Atrial fibrillation and flutter ([place])    a. h/o PAF/flutter during admission in 2013 for PNA. b. PAF during adm for NSTEMI 07/2015, subsequent paroxysms since then.  . B12 deficiency anemia   . Cardiac tamponade 06/2016  . CHF (congestive heart failure) ([place])   . Coronary artery disease 11/30/2014   a. remote MI. b. h/o PTCA with scoring balloon to OM1 11/2014. c. NSTEMI 03/2015 s/p DES to prox-mid Cx. d. NSTEMI 07/2015 s/p scoring balloon/PTCA/DES to dRCA with PAF during that admission  . Cutaneous lupus erythematosus   . GERD (gastroesophageal reflux disease)   . Heart block   . History of blood transfusion 1980's   2nd surgical procedures  . HTN (hypertension)   . Hypercholesteremia   . Hypothyroidism   . Myocardial infarction ([place]) 02/2012  . Ovarian tumor   . PAD (peripheral artery disease) ([place])    a. s/p LE angio 2015; followed by Dr. Fletcher Anon - managed medically.  . Pain with urination 05/08/2015  . Paroxysmal atrial flutter ([place])   . Pericardial effusion    a. 06/2016 after ppm - s/p pericardiocentesis.  . Superficial fungus infection of skin 06/29/2013  . Tachy-brady syndrome ([place])    a. s/p [employer] PPM 06/2016, c/b lead perf/pericardial effusion.  [name] TIA  (transient ischemic attack) 08/2001; ~ 2006  . Type II diabetes mellitus ([place])   . UTI (urinary tract infection) 05/08/2013    Patient Active Problem List   Diagnosis Date Noted  . Acute on chronic diastolic HF (heart failure) ([place]) 12/17/2017  . GERD (gastroesophageal reflux disease) 12/17/2017  . Typical atrial flutter ([place])   . 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 Paroxysmal atrial fibrillation ([place]) 12/08/2017  . 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 CKD (chronic kidney disease) stage 3, GFR 30-59 ml/min (H[facility]) 09/23/2017  . Fever 09/23/2017  . S/P pericardiocentesis 06/28/2016  . Acute blood loss anemia 06/28/2016  . Pericardial effusion 06/26/2016  . Tachy-brady syndrome ([place]) 06/25/2016  . Tamponade   . Bradycardia 06/14/2016  . Junctional bradycardia   . Coronary artery disease involving coronary bypass graft of native heart with unstable angina pectoris ([place])   . Acute diastolic CHF (congestive heart failure), NYHA class 3 ([place])   . Systolic congestive heart failure ([place]) 05/13/2016  . Multiple and bilateral precerebral artery syndromes 05/13/2016  . OSA (obstructive sleep apnea) 05/13/2016  . Chronic diarrhea 12/25/2015  . Chest pain 08/02/2015  . Atrial fibrillation with rapid ventricular response ([place])   . Pain with urination 05/08/2015  . NSTEMI (non-ST elevated myocardial infarction) ([place]) 04/02/2015  . CAD in native artery 11/30/2014  . Coronary artery disease involving native coronary artery with  other forms of angina pectoris   . PAD (peripheral artery disease) ([place]) 12/26/2013  . Superficial fungus infection of skin 06/29/2013  . UTI (urinary tract infection) 05/08/2013  . Hypokalemia 03/05/2012  . B12 deficiency anemia 03/02/2012  . Bronchospasm 03/02/2012  . Community acquired bacterial pneumonia 03/01/2012  . Acute respiratory failure with hypoxia ([place]) 03/01/2012  . DM type 2 causing vascular disease ([place])  02/29/2012  . Hypothyroidism 02/28/2012  . RLQ abdominal pain 11/24/2010  . OVERWEIGHT/OBESITY 06/03/2010  . Essential hypertension 06/03/2010  . Mixed hyperlipidemia 12/27/2009  . Palpitations 05/17/2009  . FRACTURE, TOE 12/06/2007    Past Surgical History:  Procedure Laterality Date  . ABDOMINAL AORTAGRAM N/A 01/03/2014   Procedure: ABDOMINAL [name];  Surgeon: Wellington Hampshire, MD;  Location: [facility] CATH [facility];  Service: Cardiovascular;  Laterality: N/A;  . ABDOMINAL HYSTERECTOMY  1972   "partial"  . APPENDECTOMY  1970's  . CARDIAC CATHETERIZATION  2008   Tiny OM-2 with 90% narrowing. Med tx.  [name] CARDIAC CATHETERIZATION N/A 11/30/2014   Procedure: Left Heart Cath and Coronary Angiography;  Surgeon: Troy Sine, MD; LAD 20%, CFX 50%, OM1 95%, right PLB 30%, LV normal   . CARDIAC CATHETERIZATION N/A 11/30/2014   Procedure: Coronary Balloon Angioplasty;  Surgeon: Troy Sine, MD;  Angiosculpt scoring balloon and PTCA to the OM1 reducing stenosis from 95% to less than 10%  . CARDIAC CATHETERIZATION N/A 04/03/2015   Procedure: Left Heart Cath and Coronary Angiography;  Surgeon: Jolaine Artist, MD; dLAD 50%, CFX 90%, OM1 100%, PLA 15%, LVEDP 13    . CARDIAC CATHETERIZATION N/A 04/03/2015   Procedure: Coronary Stent Intervention;  Surgeon: Sherren Mocha, MD; 3.0x18 mm Xience DES to the CFX    . CARDIAC CATHETERIZATION N/A 08/02/2015   Procedure: Left Heart Cath and Coronary Angiography;  Surgeon: Troy Sine, MD;  Location: [facility] CV LAB;  Service: Cardiovascular;  Laterality: N/A;  . CARDIAC CATHETERIZATION N/A 08/02/2015   Procedure: Coronary Stent Intervention;  Surgeon: Troy Sine, MD;  Location: [facility] CV LAB;  Service: Cardiovascular;  Laterality: N/A;  . CARDIAC CATHETERIZATION N/A 06/25/2016   Procedure: Pericardiocentesis;  Surgeon: Will Meredith Leeds, MD;  Location: [facility];  Service: Cardiovascular;  Laterality: N/A;  . cardiac stents    .  CARDIOVERSION N/A 12/15/2017   Procedure: CARDIOVERSION;  Surgeon: Acie Fredrickson Wonda Cheng, MD;  Location: [facility] ENDOSCOPY;  Service: Cardiovascular;  Laterality: N/A;  . CHOLECYSTECTOMY OPEN  1990's  . COLONOSCOPY  2005   Dr. Laural Golden: pancolonic divericula, polyp, path unknown currently  . COLONOSCOPY  2012   Dr. Oneida Alar: Normal TI, scattered diverticula in entire colon, small internal hemorrhoids, normal colon biopsies. Colonoscopy in 5-10 years.   . COLOSTOMY  05/1979  . COLOSTOMY REVERSAL  11/1979  . EP IMPLANTABLE DEVICE N/A 06/25/2016   Procedure: Lead Revision/Repair;  Surgeon: Will Meredith Leeds, MD;  Location: [facility] LAB;  Service: Cardiovascular;  Laterality: N/A;  . EP IMPLANTABLE DEVICE N/A 06/25/2016   Procedure: Pacemaker Implant;  Surgeon: Will Meredith Leeds, MD;  Location: [facility] LAB;  Service: Cardiovascular;  Laterality: N/A;  . EXCISIONAL HEMORRHOIDECTOMY  1970's  . EYE SURGERY Left 2000   "branch vein occlusion"  . EYE SURGERY Left ~ 2001   "smoothed out wrinkle"  . LEFT OOPHORECTOMY  05/1979   nicked bowel, peritonitis, colostomy; colostomy reversed 1981   . LOWER EXTREMITY ANGIOGRAM N/A 01/03/2014   Procedure: LOWER EXTREMITY ANGIOGRAM;  Surgeon: Rogue Jury  Ferne Reus, MD;  Location: [facility] CATH LAB;  Service: Cardiovascular;  Laterality: N/A;  . Nuclear med stress test  10/2011   Small area of mild ischemia inferoapically.  [name] PARTIAL HYSTERECTOMY  1970's   left ovaries, then ovaries removed later due tumors   . RIGHT OOPHORECTOMY  1970's   Allergies Penicillins and Percocet [oxycodone-acetaminophen]  Family History  Problem Relation Age of Onset  . Heart disease Mother        deceased  . Heart disease Father        deceased, heart disease  . Diabetes Brother   . Heart disease Brother   . Thyroid disease Brother   . Heart disease Sister   . Heart disease Brother   . Thyroid disease Brother   . Lupus Daughter   . Colon cancer Neg Hx     Social History Social  History   Tobacco Use  . Smoking status: Never Smoker  . Smokeless tobacco: Never Used  . Tobacco comment: Never smoked  Substance Use Topics  . Alcohol use: No    Alcohol/week: 0.0 standard drinks  . Drug use: No    Review of Systems  Constitutional: No fever/chills Eyes: No visual changes. ENT: No sore throat. Positive congestion.  Cardiovascular: Denies chest pain. Respiratory: Positive mild shortness of breath. Positive cough.  Gastrointestinal: No abdominal pain.  No nausea, no vomiting.  No diarrhea.  No constipation. Genitourinary: Negative for dysuria. Musculoskeletal: Negative for back pain. Skin: Negative for rash. Neurological: Negative for headaches, focal weakness or numbness.  10-point ROS otherwise negative.  ____________________________________________   PHYSICAL EXAM:  VITAL SIGNS: ED Triage Vitals  Enc Vitals Group     BP 08/24/18 2143 (!) 174/77     Pulse Rate 08/24/18 2143 66     Resp 08/24/18 2143 20     Temp 08/24/18 2143 (!) 97.4 F (36.3 C)     Temp Source 08/24/18 2143 Oral     SpO2 08/24/18 2143 96 %     Weight 08/24/18 2142 143 lb 8 oz (65.1 kg)     Height 08/24/18 2142 5\' 3"  (1.6 m)   Constitutional: Alert and oriented. Well appearing and in no acute distress. Eyes: Conjunctivae are normal.  Head: Atraumatic. Nose: No congestion/rhinnorhea. Mouth/Throat: Mucous membranes are moist.  Neck: No stridor.   Cardiovascular: Normal rate, regular rhythm. Good peripheral circulation. Grossly normal heart sounds.   Respiratory: Normal respiratory effort.  No retractions. Lungs with coarse wheezing and rhonchi at the bases. No rales.  Gastrointestinal: Soft and nontender. No distention.  Musculoskeletal: No lower extremity tenderness nor edema. No gross deformities of extremities. Neurologic:  Normal speech and language. No gross focal neurologic deficits are appreciated.  Skin:  Skin is warm, dry and intact. No rash  noted.  ____________________________________________   LABS (all labs ordered are listed, but only abnormal results are displayed)  Labs Reviewed  BASIC METABOLIC PANEL - Abnormal; Notable for the following components:      Result Value   Glucose, Bld 146 (*)    BUN 25 (*)    Creatinine, Ser 1.66 (*)    Calcium 8.7 (*)    GFR calc non Af Amer 30 (*)    GFR calc Af Amer 35 (*)    All other components within normal limits  BRAIN NATRIURETIC PEPTIDE - Abnormal; Notable for the following components:   B Natriuretic Peptide 149.0 (*)    All other components within normal limits  CBC WITH DIFFERENTIAL/PLATELET  TROPONIN I  INFLUENZA PANEL BY PCR (TYPE A & B)   ____________________________________________  EKG   EKG Interpretation  Date/Time:  Wednesday August 24 2018 22:38:39 EST Ventricular Rate:  62 PR Interval:    QRS Duration: 108 QT Interval:  503 QTC Calculation: 511 R Axis:   -11 Text Interpretation:  Sinus rhythm Abnormal R-wave progression, late transition LVH with secondary repolarization abnormality Prolonged QT interval Baseline wander in lead(s) III No STEMI.  Confirmed by Nanda Quinton [phone]) on 08/24/2018 10:57:32 PM       ____________________________________________  RADIOLOGY  Dg Chest 2 View  Result Date: 08/24/2018 CLINICAL DATA:  Cough EXAM: CHEST - 2 VIEW COMPARISON:  12/20/2017 FINDINGS: Left-sided pacing device as before. Streaky atelectasis versus minimal infiltrate in the left lower lung. Normal heart size. No pneumothorax. IMPRESSION: Streaky atelectasis versus minimal infiltrate at the left lung base. Electronically Signed   By: Donavan Foil M.D.   On: 08/24/2018 22:16    ____________________________________________   PROCEDURES  Procedure(s) performed:   Procedures  None  ____________________________________________   INITIAL IMPRESSION / ASSESSMENT AND PLAN / ED COURSE  Pertinent labs & imaging results that were available during my  care of the patient were reviewed by me and considered in my medical decision making (see chart for details).  Patient presents to the emergency department with cough, congestion, mild shortness of breath.  She is in no acute distress and has normal oxygen levels at rest.  Chest x-ray shows possible infiltrate in the left.  No evidence of volume overload either on chest x-ray or clinically.  Plan for ambulation on pulse ox, screening labs given multiple medical comorbidities, flu testing, and reassess.  Patient ambulated without SOB or hypoxemia. FLu negative. Labs without leukocytosis. No anemia. BNP only mildly elevated. Plan to cover with Doxycycline and Tessalon for cough. 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 Patient has follow up EKG schedule by PCP with this being diagnosed recently. Will avoid QT-prolonging medications. Discussed ED return precautions.  ____________________________________________  FINAL CLINICAL IMPRESSION(S) / ED DIAGNOSES  Final diagnoses:  Community acquired pneumonia of left lung, unspecified part of lung  Cough  Prolonged Q-T interval on ECG     MEDICATIONS GIVEN DURING THIS VISIT:  Medications  ipratropium-albuterol (DUONEB) 0.5-2.5 (3) MG/3ML nebulizer solution 3 mL (3 mLs Nebulization Given 08/24/18 2240)     NEW OUTPATIENT MEDICATIONS STARTED DURING THIS VISIT:  Discharge Medication List as of 08/24/2018 11:52 PM    START taking these medications   Details  doxycycline (VIBRAMYCIN) 100 MG capsule Take 1 capsule (100 mg total) by mouth 2 (two) times daily for 7 days., Starting Wed 08/24/2018, Until Wed 08/31/2018, Print        Note:  This document was prepared using Dragon voice recognition software and may include unintentional dictation errors.  Nanda Quinton, MD Emergency Medicine    Long, Wonda Olds, MD 08/25/18 [phone]

## 2018-08-29 DIAGNOSIS — J069 Acute upper respiratory infection, unspecified: Secondary | ICD-10-CM | POA: Diagnosis not present

## 2018-08-29 DIAGNOSIS — E663 Overweight: Secondary | ICD-10-CM | POA: Diagnosis not present

## 2018-08-29 DIAGNOSIS — Z6826 Body mass index (BMI) 26.0-26.9, adult: Secondary | ICD-10-CM | POA: Diagnosis not present

## 2018-08-30 ENCOUNTER — Encounter: Payer: PPO | Admitting: Cardiology

## 2018-08-30 ENCOUNTER — Ambulatory Visit (INDEPENDENT_AMBULATORY_CARE_PROVIDER_SITE_OTHER): Payer: PPO | Admitting: *Deleted

## 2018-08-30 VITALS — HR 62 | Ht 63.0 in

## 2018-08-30 DIAGNOSIS — I480 Paroxysmal atrial fibrillation: Secondary | ICD-10-CM

## 2018-08-30 NOTE — Progress Notes (Signed)
This encounter was created in error - please disregard.

## 2018-08-30 NOTE — Progress Notes (Signed)
1.  Reason for visit: EKG following decreased Amiodarone  (Amiodarone decreased secondary to prolonged QTC on 2/25)  2.  Name of MD requesting visit:  Camnitz 3. H&P:  See epic  4.  ROS related to problem:  See epic     5.  Assessment and plan per MD:   EKG showing SR, HR 62, QT/QTC 478/485 Reviewed with DOD, Dr. Marlou Porch, no orders received.  Will have Camnitz review next week.

## 2018-09-01 DIAGNOSIS — G4733 Obstructive sleep apnea (adult) (pediatric): Secondary | ICD-10-CM | POA: Diagnosis not present

## 2018-09-01 DIAGNOSIS — E663 Overweight: Secondary | ICD-10-CM | POA: Diagnosis not present

## 2018-09-01 DIAGNOSIS — J9601 Acute respiratory failure with hypoxia: Secondary | ICD-10-CM | POA: Diagnosis not present

## 2018-09-01 DIAGNOSIS — J159 Unspecified bacterial pneumonia: Secondary | ICD-10-CM | POA: Diagnosis not present

## 2018-09-07 ENCOUNTER — Telehealth: Payer: Self-pay | Admitting: Cardiology

## 2018-09-07 NOTE — Telephone Encounter (Signed)
New message   Patient would like a call to discuss EKG.

## 2018-09-07 NOTE — Telephone Encounter (Signed)
Pt aware Dr. Curt Bears will review. Aware I will f/u once he has reviewed. She is agreeable to plan.

## 2018-09-09 NOTE — Telephone Encounter (Signed)
Informed Dr. Curt Bears reviewed. Continue Amiodarone 100 mg daily. Patient verbalized understanding and agreeable to plan.

## 2018-09-10 ENCOUNTER — Other Ambulatory Visit: Payer: Self-pay | Admitting: Cardiovascular Disease

## 2018-09-12 ENCOUNTER — Other Ambulatory Visit: Payer: Self-pay | Admitting: Cardiology

## 2018-09-15 DIAGNOSIS — H34831 Tributary (branch) retinal vein occlusion, right eye, with macular edema: Secondary | ICD-10-CM | POA: Diagnosis not present

## 2018-09-15 DIAGNOSIS — H33301 Unspecified retinal break, right eye: Secondary | ICD-10-CM | POA: Diagnosis not present

## 2018-09-15 DIAGNOSIS — H43811 Vitreous degeneration, right eye: Secondary | ICD-10-CM | POA: Diagnosis not present

## 2018-09-22 ENCOUNTER — Other Ambulatory Visit: Payer: Self-pay | Admitting: Cardiovascular Disease

## 2018-09-26 ENCOUNTER — Other Ambulatory Visit: Payer: Self-pay

## 2018-09-26 ENCOUNTER — Ambulatory Visit (INDEPENDENT_AMBULATORY_CARE_PROVIDER_SITE_OTHER): Payer: PPO | Admitting: *Deleted

## 2018-09-26 DIAGNOSIS — I480 Paroxysmal atrial fibrillation: Secondary | ICD-10-CM

## 2018-09-26 DIAGNOSIS — I495 Sick sinus syndrome: Secondary | ICD-10-CM | POA: Diagnosis not present

## 2018-09-26 DIAGNOSIS — Z1231 Encounter for screening mammogram for malignant neoplasm of breast: Secondary | ICD-10-CM | POA: Diagnosis not present

## 2018-09-26 LAB — CUP PACEART REMOTE DEVICE CHECK
Battery Remaining Longevity: 92 mo
Battery Voltage: 3.01 V
Brady Statistic AP VP Percent: 0.06 %
Brady Statistic AP VS Percent: 93.38 %
Brady Statistic AS VP Percent: 0.01 %
Brady Statistic AS VS Percent: 6.55 %
Brady Statistic RA Percent Paced: 93.43 %
Brady Statistic RV Percent Paced: 0.07 %
Date Time Interrogation Session: 20200406131217
Implantable Lead Implant Date: 20180104
Implantable Lead Implant Date: 20180104
Implantable Lead Location: 753859
Implantable Lead Location: 753860
Implantable Lead Model: 5076
Implantable Lead Model: 5076
Implantable Pulse Generator Implant Date: 20180104
Lead Channel Impedance Value: 342 Ohm
Lead Channel Impedance Value: 380 Ohm
Lead Channel Impedance Value: 380 Ohm
Lead Channel Impedance Value: 494 Ohm
Lead Channel Pacing Threshold Amplitude: 0.625 V
Lead Channel Pacing Threshold Amplitude: 1.125 V
Lead Channel Pacing Threshold Pulse Width: 0.4 ms
Lead Channel Pacing Threshold Pulse Width: 0.4 ms
Lead Channel Sensing Intrinsic Amplitude: 1.5 mV
Lead Channel Sensing Intrinsic Amplitude: 1.5 mV
Lead Channel Sensing Intrinsic Amplitude: 14.75 mV
Lead Channel Sensing Intrinsic Amplitude: 14.75 mV
Lead Channel Setting Pacing Amplitude: 2 V
Lead Channel Setting Pacing Amplitude: 2.75 V
Lead Channel Setting Pacing Pulse Width: 0.4 ms
Lead Channel Setting Sensing Sensitivity: 2.8 mV

## 2018-10-02 DIAGNOSIS — J9601 Acute respiratory failure with hypoxia: Secondary | ICD-10-CM | POA: Diagnosis not present

## 2018-10-02 DIAGNOSIS — G4733 Obstructive sleep apnea (adult) (pediatric): Secondary | ICD-10-CM | POA: Diagnosis not present

## 2018-10-02 DIAGNOSIS — E663 Overweight: Secondary | ICD-10-CM | POA: Diagnosis not present

## 2018-10-02 DIAGNOSIS — J159 Unspecified bacterial pneumonia: Secondary | ICD-10-CM | POA: Diagnosis not present

## 2018-10-03 DIAGNOSIS — M19011 Primary osteoarthritis, right shoulder: Secondary | ICD-10-CM | POA: Diagnosis not present

## 2018-10-03 DIAGNOSIS — I5042 Chronic combined systolic (congestive) and diastolic (congestive) heart failure: Secondary | ICD-10-CM | POA: Diagnosis not present

## 2018-10-03 DIAGNOSIS — G43009 Migraine without aura, not intractable, without status migrainosus: Secondary | ICD-10-CM | POA: Diagnosis not present

## 2018-10-03 DIAGNOSIS — G894 Chronic pain syndrome: Secondary | ICD-10-CM | POA: Diagnosis not present

## 2018-10-03 DIAGNOSIS — G40309 Generalized idiopathic epilepsy and epileptic syndromes, not intractable, without status epilepticus: Secondary | ICD-10-CM | POA: Diagnosis not present

## 2018-10-03 DIAGNOSIS — E7800 Pure hypercholesterolemia, unspecified: Secondary | ICD-10-CM | POA: Diagnosis not present

## 2018-10-03 DIAGNOSIS — Z01812 Encounter for preprocedural laboratory examination: Secondary | ICD-10-CM | POA: Diagnosis not present

## 2018-10-03 DIAGNOSIS — E785 Hyperlipidemia, unspecified: Secondary | ICD-10-CM | POA: Diagnosis not present

## 2018-10-03 DIAGNOSIS — M9905 Segmental and somatic dysfunction of pelvic region: Secondary | ICD-10-CM | POA: Diagnosis not present

## 2018-10-03 DIAGNOSIS — M792 Neuralgia and neuritis, unspecified: Secondary | ICD-10-CM | POA: Diagnosis not present

## 2018-10-03 DIAGNOSIS — E039 Hypothyroidism, unspecified: Secondary | ICD-10-CM | POA: Diagnosis not present

## 2018-10-03 DIAGNOSIS — E7849 Other hyperlipidemia: Secondary | ICD-10-CM | POA: Diagnosis not present

## 2018-10-03 DIAGNOSIS — D649 Anemia, unspecified: Secondary | ICD-10-CM | POA: Diagnosis not present

## 2018-10-03 DIAGNOSIS — E119 Type 2 diabetes mellitus without complications: Secondary | ICD-10-CM | POA: Diagnosis not present

## 2018-10-03 DIAGNOSIS — E559 Vitamin D deficiency, unspecified: Secondary | ICD-10-CM | POA: Diagnosis not present

## 2018-10-03 DIAGNOSIS — Z6825 Body mass index (BMI) 25.0-25.9, adult: Secondary | ICD-10-CM | POA: Diagnosis not present

## 2018-10-03 DIAGNOSIS — Z1389 Encounter for screening for other disorder: Secondary | ICD-10-CM | POA: Diagnosis not present

## 2018-10-03 DIAGNOSIS — R079 Chest pain, unspecified: Secondary | ICD-10-CM | POA: Diagnosis not present

## 2018-10-03 DIAGNOSIS — Z0001 Encounter for general adult medical examination with abnormal findings: Secondary | ICD-10-CM | POA: Diagnosis not present

## 2018-10-03 DIAGNOSIS — E663 Overweight: Secondary | ICD-10-CM | POA: Diagnosis not present

## 2018-10-03 DIAGNOSIS — M170 Bilateral primary osteoarthritis of knee: Secondary | ICD-10-CM | POA: Diagnosis not present

## 2018-10-03 DIAGNOSIS — N139 Obstructive and reflux uropathy, unspecified: Secondary | ICD-10-CM | POA: Diagnosis not present

## 2018-10-03 DIAGNOSIS — Z20828 Contact with and (suspected) exposure to other viral communicable diseases: Secondary | ICD-10-CM | POA: Diagnosis not present

## 2018-10-03 DIAGNOSIS — E782 Mixed hyperlipidemia: Secondary | ICD-10-CM | POA: Diagnosis not present

## 2018-10-03 DIAGNOSIS — R05 Cough: Secondary | ICD-10-CM | POA: Diagnosis not present

## 2018-10-03 DIAGNOSIS — I2510 Atherosclerotic heart disease of native coronary artery without angina pectoris: Secondary | ICD-10-CM | POA: Diagnosis not present

## 2018-10-03 DIAGNOSIS — M5136 Other intervertebral disc degeneration, lumbar region: Secondary | ICD-10-CM | POA: Diagnosis not present

## 2018-10-03 DIAGNOSIS — R5381 Other malaise: Secondary | ICD-10-CM | POA: Diagnosis not present

## 2018-10-03 DIAGNOSIS — I509 Heart failure, unspecified: Secondary | ICD-10-CM | POA: Diagnosis not present

## 2018-10-03 DIAGNOSIS — M9903 Segmental and somatic dysfunction of lumbar region: Secondary | ICD-10-CM | POA: Diagnosis not present

## 2018-10-03 DIAGNOSIS — I10 Essential (primary) hypertension: Secondary | ICD-10-CM | POA: Diagnosis not present

## 2018-10-03 DIAGNOSIS — R948 Abnormal results of function studies of other organs and systems: Secondary | ICD-10-CM | POA: Diagnosis not present

## 2018-10-03 DIAGNOSIS — N4883 Acquired buried penis: Secondary | ICD-10-CM | POA: Diagnosis not present

## 2018-10-03 DIAGNOSIS — N3941 Urge incontinence: Secondary | ICD-10-CM | POA: Diagnosis not present

## 2018-10-03 DIAGNOSIS — I428 Other cardiomyopathies: Secondary | ICD-10-CM | POA: Diagnosis not present

## 2018-10-03 DIAGNOSIS — R0602 Shortness of breath: Secondary | ICD-10-CM | POA: Diagnosis not present

## 2018-10-03 DIAGNOSIS — E539 Vitamin B deficiency, unspecified: Secondary | ICD-10-CM | POA: Diagnosis not present

## 2018-10-03 DIAGNOSIS — M9904 Segmental and somatic dysfunction of sacral region: Secondary | ICD-10-CM | POA: Diagnosis not present

## 2018-10-03 DIAGNOSIS — Z13220 Encounter for screening for lipoid disorders: Secondary | ICD-10-CM | POA: Diagnosis not present

## 2018-10-05 NOTE — Progress Notes (Signed)
Remote pacemaker transmission.   

## 2018-10-06 ENCOUNTER — Other Ambulatory Visit: Payer: Self-pay | Admitting: Cardiology

## 2018-10-18 DIAGNOSIS — H3561 Retinal hemorrhage, right eye: Secondary | ICD-10-CM | POA: Diagnosis not present

## 2018-10-18 DIAGNOSIS — H34831 Tributary (branch) retinal vein occlusion, right eye, with macular edema: Secondary | ICD-10-CM | POA: Diagnosis not present

## 2018-10-18 DIAGNOSIS — H35041 Retinal micro-aneurysms, unspecified, right eye: Secondary | ICD-10-CM | POA: Diagnosis not present

## 2018-10-18 DIAGNOSIS — H43811 Vitreous degeneration, right eye: Secondary | ICD-10-CM | POA: Diagnosis not present

## 2018-10-25 ENCOUNTER — Other Ambulatory Visit: Payer: Self-pay | Admitting: Cardiovascular Disease

## 2018-10-31 ENCOUNTER — Ambulatory Visit: Payer: PPO | Admitting: Cardiovascular Disease

## 2018-11-22 DIAGNOSIS — H35041 Retinal micro-aneurysms, unspecified, right eye: Secondary | ICD-10-CM | POA: Diagnosis not present

## 2018-11-22 DIAGNOSIS — H43811 Vitreous degeneration, right eye: Secondary | ICD-10-CM | POA: Diagnosis not present

## 2018-11-22 DIAGNOSIS — H3561 Retinal hemorrhage, right eye: Secondary | ICD-10-CM | POA: Diagnosis not present

## 2018-11-22 DIAGNOSIS — H34831 Tributary (branch) retinal vein occlusion, right eye, with macular edema: Secondary | ICD-10-CM | POA: Diagnosis not present

## 2018-11-23 ENCOUNTER — Other Ambulatory Visit: Payer: Self-pay | Admitting: Cardiovascular Disease

## 2018-12-26 ENCOUNTER — Other Ambulatory Visit: Payer: Self-pay | Admitting: Cardiovascular Disease

## 2018-12-26 ENCOUNTER — Telehealth: Payer: Self-pay | Admitting: *Deleted

## 2018-12-26 NOTE — Telephone Encounter (Signed)
Patient called in to report her home monitor failed and she had to contact [employer] to order her a new one. Should arrive in <10 days. Pt agreeable to rescheduling to 01/05/19, aware to call back if she hasn't received the monitor by this date. She denies questions at this time and thanked me for my call.

## 2018-12-27 DIAGNOSIS — H34831 Tributary (branch) retinal vein occlusion, right eye, with macular edema: Secondary | ICD-10-CM | POA: Diagnosis not present

## 2018-12-27 DIAGNOSIS — E113411 Type 2 diabetes mellitus with severe nonproliferative diabetic retinopathy with macular edema, right eye: Secondary | ICD-10-CM | POA: Diagnosis not present

## 2019-01-04 DIAGNOSIS — I10 Essential (primary) hypertension: Secondary | ICD-10-CM | POA: Diagnosis not present

## 2019-01-04 DIAGNOSIS — E039 Hypothyroidism, unspecified: Secondary | ICD-10-CM | POA: Diagnosis not present

## 2019-01-04 DIAGNOSIS — I2510 Atherosclerotic heart disease of native coronary artery without angina pectoris: Secondary | ICD-10-CM | POA: Diagnosis not present

## 2019-01-04 DIAGNOSIS — Z681 Body mass index (BMI) 19 or less, adult: Secondary | ICD-10-CM | POA: Diagnosis not present

## 2019-01-04 DIAGNOSIS — E119 Type 2 diabetes mellitus without complications: Secondary | ICD-10-CM | POA: Diagnosis not present

## 2019-01-04 DIAGNOSIS — I4891 Unspecified atrial fibrillation: Secondary | ICD-10-CM | POA: Diagnosis not present

## 2019-01-04 DIAGNOSIS — Z1389 Encounter for screening for other disorder: Secondary | ICD-10-CM | POA: Diagnosis not present

## 2019-01-05 ENCOUNTER — Ambulatory Visit (INDEPENDENT_AMBULATORY_CARE_PROVIDER_SITE_OTHER): Payer: PPO | Admitting: *Deleted

## 2019-01-05 DIAGNOSIS — I495 Sick sinus syndrome: Secondary | ICD-10-CM | POA: Diagnosis not present

## 2019-01-06 LAB — CUP PACEART REMOTE DEVICE CHECK
Battery Remaining Longevity: 90 mo
Battery Voltage: 3.01 V
Brady Statistic AP VP Percent: 0.06 %
Brady Statistic AP VS Percent: 88.63 %
Brady Statistic AS VP Percent: 0.02 %
Brady Statistic AS VS Percent: 11.29 %
Brady Statistic RA Percent Paced: 88.69 %
Brady Statistic RV Percent Paced: 0.08 %
Date Time Interrogation Session: 20200716131320
Implantable Lead Implant Date: 20180104
Implantable Lead Implant Date: 20180104
Implantable Lead Location: 753859
Implantable Lead Location: 753860
Implantable Lead Model: 5076
Implantable Lead Model: 5076
Implantable Pulse Generator Implant Date: 20180104
Lead Channel Impedance Value: 342 Ohm
Lead Channel Impedance Value: 380 Ohm
Lead Channel Impedance Value: 399 Ohm
Lead Channel Impedance Value: 494 Ohm
Lead Channel Pacing Threshold Amplitude: 0.5 V
Lead Channel Pacing Threshold Amplitude: 1.375 V
Lead Channel Pacing Threshold Pulse Width: 0.4 ms
Lead Channel Pacing Threshold Pulse Width: 0.4 ms
Lead Channel Sensing Intrinsic Amplitude: 1.625 mV
Lead Channel Sensing Intrinsic Amplitude: 1.625 mV
Lead Channel Sensing Intrinsic Amplitude: 17.75 mV
Lead Channel Sensing Intrinsic Amplitude: 17.75 mV
Lead Channel Setting Pacing Amplitude: 2 V
Lead Channel Setting Pacing Amplitude: 2.75 V
Lead Channel Setting Pacing Pulse Width: 0.4 ms
Lead Channel Setting Sensing Sensitivity: 2.8 mV

## 2019-01-12 ENCOUNTER — Encounter: Payer: Self-pay | Admitting: Cardiology

## 2019-01-12 NOTE — Progress Notes (Signed)
Remote pacemaker transmission.   

## 2019-01-17 ENCOUNTER — Other Ambulatory Visit: Payer: Self-pay

## 2019-01-17 ENCOUNTER — Ambulatory Visit (INDEPENDENT_AMBULATORY_CARE_PROVIDER_SITE_OTHER): Payer: PPO | Admitting: Cardiovascular Disease

## 2019-01-17 ENCOUNTER — Encounter: Payer: Self-pay | Admitting: Cardiovascular Disease

## 2019-01-17 VITALS — BP 122/60 | HR 68 | Temp 98.5°F | Ht 63.0 in | Wt 145.0 lb

## 2019-01-17 DIAGNOSIS — I5032 Chronic diastolic (congestive) heart failure: Secondary | ICD-10-CM | POA: Diagnosis not present

## 2019-01-17 DIAGNOSIS — Z950 Presence of cardiac pacemaker: Secondary | ICD-10-CM

## 2019-01-17 DIAGNOSIS — Z79899 Other long term (current) drug therapy: Secondary | ICD-10-CM | POA: Diagnosis not present

## 2019-01-17 DIAGNOSIS — G4733 Obstructive sleep apnea (adult) (pediatric): Secondary | ICD-10-CM

## 2019-01-17 DIAGNOSIS — E785 Hyperlipidemia, unspecified: Secondary | ICD-10-CM | POA: Diagnosis not present

## 2019-01-17 DIAGNOSIS — I495 Sick sinus syndrome: Secondary | ICD-10-CM | POA: Diagnosis not present

## 2019-01-17 DIAGNOSIS — I480 Paroxysmal atrial fibrillation: Secondary | ICD-10-CM

## 2019-01-17 DIAGNOSIS — I25118 Atherosclerotic heart disease of native coronary artery with other forms of angina pectoris: Secondary | ICD-10-CM

## 2019-01-17 DIAGNOSIS — I10 Essential (primary) hypertension: Secondary | ICD-10-CM

## 2019-01-17 MED ORDER — APIXABAN 5 MG PO TABS: 5.0000 mg | ORAL_TABLET | Freq: Two times a day (BID) | ORAL | 0 refills | Status: DC

## 2019-01-17 NOTE — Patient Instructions (Signed)

## 2019-01-17 NOTE — Addendum Note (Signed)
Addended by: Laurine Blazer on: 01/17/2019 03:10 PM   Modules accepted: Orders

## 2019-01-17 NOTE — Progress Notes (Signed)
SUBJECTIVE: Patient presents for routine follow-up.  She has coronary artery disease and paroxysmal atrial fibrillation with tachybradycardia syndrome and is status post Medtronic dual-chamber pacemaker placement.  She is doing very well and denies chest pain, palpitations, leg swelling, orthopnea, and shortness of breath.  She apparently had blood work at her PCPs office within the past few weeks.    Review of Systems: As per "subjective", otherwise negative.  Allergies  Allergen Reactions  . Penicillins Hives    Has patient had a PCN reaction causing immediate rash, facial/tongue/throat swelling, SOB or lightheadedness with hypotension: Yes Has patient had a PCN reaction causing severe rash involving mucus membranes or skin necrosis: No Has patient had a PCN reaction that required hospitalization No Has patient had a PCN reaction occurring within the last 10 years: No If all of the above answers are "NO", then may proceed with Cephalosporin use.  [name] Percocet [Oxycodone-Acetaminophen] Nausea And Vomiting    Current Outpatient Medications  Medication Sig Dispense Refill  . acetaminophen (TYLENOL) 325 MG tablet Take 650 mg by mouth every 6 (six) hours as needed for headache.    [name] amiodarone (PACERONE) 200 MG tablet Take 0.5 tablets (100 mg total) by mouth daily. 45 tablet 1  . atorvastatin (LIPITOR) 80 MG tablet TAKE ONE TABLET BY MOUTH EVERY DAY AT 6:00PM 90 tablet 3  . benzonatate (TESSALON) 100 MG capsule Take 1 capsule (100 mg total) by mouth every 8 (eight) hours. 21 capsule 0  . carvedilol (COREG) 6.25 MG tablet TAKE (6.25MG  TOTAL) BY MOUTH TWO TIMES DAILY 180 tablet 3  . Chlorpheniramine-APAP (CORICIDIN) 2-325 MG TABS Take 1-2 tablets by mouth daily as needed (for cold/flu).    . Cholecalciferol (VITAMIN D) 2000 units tablet Take 2,000 Units by mouth daily.     . clopidogrel (PLAVIX) 75 MG tablet Take 1 tablet (75 mg total) by mouth daily. 90 tablet 1  . diltiazem  (CARDIZEM CD) 360 MG 24 hr capsule Take 1 capsule (360 mg total) by mouth daily. 90 capsule 3  . diphenhydramine-acetaminophen (TYLENOL PM EXTRA STRENGTH) 25-500 MG TABS tablet Take 1 tablet by mouth at bedtime as needed (Take as directed).     [name] ELIQUIS 5 MG TABS tablet TAKE ONE TABLET BY MOUTH TWICE A DAY 180 tablet 1  . furosemide (LASIX) 40 MG tablet TAKE ONE TABLET (40MG  TOTAL) BY MOUTH TWO TIMES DAILY (Patient taking differently: Take 40 mg by mouth 2 (two) times daily. ) 180 tablet 4  . glucose blood (FREESTYLE LITE) test strip USE AS DIRECTED TWICE DAILY. 100 each 5  . insulin NPH-regular Human (NOVOLIN 70/30) (70-30) 100 UNIT/ML injection Inject 40 Units into the skin See admin instructions. Inject 25 units before breakfast and 25 units before supper  - only if pre-meal blood glucose is above 90 mg/dL. (Patient taking differently: Inject 25 Units into the skin See admin instructions. Inject 20 units before breakfast and 10 units before supper  - only if pre-meal blood glucose is above 90 mg/dL.) 10 mL 11  . levothyroxine (SYNTHROID, LEVOTHROID) 150 MCG tablet Take 1 tablet (150 mcg total) by mouth daily before breakfast. 30 tablet 1  . losartan (COZAAR) 100 MG tablet TAKE ONE (1) TABLET BY MOUTH EVERY DAY 45 tablet 1  . metFORMIN (GLUCOPHAGE) 500 MG tablet TAKE ONE TABLET BY MOUTH TWICE A DAY WITH A MEAL (Patient taking differently: Take 500 mg by mouth 2 (two) times daily with a meal. ) 180 tablet 0  .  nitroGLYCERIN (NITROSTAT) 0.4 MG SL tablet Place 1 tablet (0.4 mg total) under the tongue every 5 (five) minutes as needed for chest pain. 25 tablet 0  . potassium chloride SA (K-DUR) 20 MEQ tablet TAKE ONE (1) TABLET BY MOUTH EVERY DAY 90 tablet 1  . rOPINIRole (REQUIP) 0.5 MG tablet Take 0.5 mg by mouth at bedtime.   0   No current facility-administered medications for this visit.     Past Medical History:  Diagnosis Date  . Arthritis   . Atrial fibrillation and flutter ([place])    a. h/o  PAF/flutter during admission in 2013 for PNA. b. PAF during adm for NSTEMI 07/2015, subsequent paroxysms since then.  . B12 deficiency anemia   . Cardiac tamponade 06/2016  . CHF (congestive heart failure) ([place])   . Coronary artery disease 11/30/2014   a. remote MI. b. h/o PTCA with scoring balloon to OM1 11/2014. c. NSTEMI 03/2015 s/p DES to prox-mid Cx. d. NSTEMI 07/2015 s/p scoring balloon/PTCA/DES to dRCA with PAF during that admission  . Cutaneous lupus erythematosus   . GERD (gastroesophageal reflux disease)   . Heart block   . History of blood transfusion 1980's   2nd surgical procedures  . HTN (hypertension)   . Hypercholesteremia   . Hypothyroidism   . Myocardial infarction ([place]) 02/2012  . Ovarian tumor   . PAD (peripheral artery disease) ([place])    a. s/p LE angio 2015; followed by Dr. Fletcher Anon - managed medically.  . Pain with urination 05/08/2015  . Paroxysmal atrial flutter ([place])   . Pericardial effusion    a. 06/2016 after ppm - s/p pericardiocentesis.  . Superficial fungus infection of skin 06/29/2013  . Tachy-brady syndrome ([place])    a. s/p Medtronic PPM 06/2016, c/b lead perf/pericardial effusion.  [name] TIA (transient ischemic attack) 08/2001; ~ 2006  . Type II diabetes mellitus ([place])   . UTI (urinary tract infection) 05/08/2013    Past Surgical History:  Procedure Laterality Date  . ABDOMINAL AORTAGRAM N/A 01/03/2014   Procedure: ABDOMINAL [name];  Surgeon: Wellington Hampshire, MD;  Location: [place] CATH LAB;  Service: Cardiovascular;  Laterality: N/A;  . ABDOMINAL HYSTERECTOMY  1972   "partial"  . APPENDECTOMY  1970's  . CARDIAC CATHETERIZATION  2008   Tiny OM-2 with 90% narrowing. Med tx.  [name] CARDIAC CATHETERIZATION N/A 11/30/2014   Procedure: Left Heart Cath and Coronary Angiography;  Surgeon: Troy Sine, MD; LAD 20%, CFX 50%, OM1 95%, right PLB 30%, LV normal   . CARDIAC CATHETERIZATION N/A 11/30/2014   Procedure: Coronary Balloon Angioplasty;  Surgeon: Troy Sine, MD;   Angiosculpt scoring balloon and PTCA to the OM1 reducing stenosis from 95% to less than 10%  . CARDIAC CATHETERIZATION N/A 04/03/2015   Procedure: Left Heart Cath and Coronary Angiography;  Surgeon: Jolaine Artist, MD; dLAD 50%, CFX 90%, OM1 100%, PLA 15%, LVEDP 13    . CARDIAC CATHETERIZATION N/A 04/03/2015   Procedure: Coronary Stent Intervention;  Surgeon: Sherren Mocha, MD; 3.0x18 mm Xience DES to the CFX    . CARDIAC CATHETERIZATION N/A 08/02/2015   Procedure: Left Heart Cath and Coronary Angiography;  Surgeon: Troy Sine, MD;  Location: [place] CV LAB;  Service: Cardiovascular;  Laterality: N/A;  . CARDIAC CATHETERIZATION N/A 08/02/2015   Procedure: Coronary Stent Intervention;  Surgeon: Troy Sine, MD;  Location: [place] CV LAB;  Service: Cardiovascular;  Laterality: N/A;  . CARDIAC CATHETERIZATION N/A 06/25/2016   Procedure: Pericardiocentesis;  Surgeon:  Will Meredith Leeds, MD;  Location: [place] [facility];  Service: Cardiovascular;  Laterality: N/A;  . cardiac stents    . CARDIOVERSION N/A 12/15/2017   Procedure: CARDIOVERSION;  Surgeon: Acie Fredrickson Wonda Cheng, MD;  [facility] END[facility];  Service: Cardiovascular;  Laterality: N/A;  . CHOLECYSTECTOMY OPEN  1990's  . COLONOSCOPY  2005   Dr. Laural Golden: pancolonic divericula, polyp, path unknown currently  . COLONOSCOPY  2012   Dr. Oneida Alar: Normal TI, scattered diverticula in entire colon, small internal hemorrhoids, normal colon biopsies. Colonoscopy in 5-10 years.   . COLOSTOMY  05/1979  . COLOSTOMY REVERSAL  11/1979  . EP IMPLANTABLE DEVICE N/A 06/25/2016   Procedure: Lead Revision/Repair;  Surgeon: Will Meredith Leeds, MD;  Location: [place] CV LAB;  Service: Cardiovascular;  Laterality: N/A;  . EP IMPLANTABLE DEVICE N/A 06/25/2016   Procedure: Pacemaker Implant;  Surgeon: Will Meredith Leeds, MD;  Location: [place] CV LAB;  Service: Cardiovascular;  Laterality: N/A;  . EXCISIONAL HEMORRHOIDECTOMY  1970's  . EYE  SURGERY Left 2000   "branch vein occlusion"  . EYE SURGERY Left ~ 2001   "smoothed out wrinkle"  . LEFT OOPHORECTOMY  05/1979   nicked bowel, peritonitis, colostomy; colostomy reversed 1981   . LOWER EXTREMITY ANGIOGRAM N/A 01/03/2014   Procedure: LOWER EXTREMITY ANGIOGRAM;  Surgeon: Wellington Hampshire, MD;  Location: [place] CATH LAB;  Service: Cardiovascular;  Laterality: N/A;  . Nuclear med stress test  10/2011   Small area of mild ischemia inferoapically.  [name] PARTIAL HYSTERECTOMY  1970's   left ovaries, then ovaries removed later due tumors   . RIGHT OOPHORECTOMY  1970's    Social History   Socioeconomic History  . Marital status: Married    Spouse name: Not on file  . Number of children: Not on file  . Years of education: Not on file  . Highest education level: Not on file  Occupational History  . Occupation: Retired    Fish farm manager: RETIRED    Comment: Research officer, political party  Social Needs  . Financial resource strain: Not on file  . Food insecurity    Worry: Not on file    Inability: Not on file  . Transportation needs    Medical: Not on file    Non-medical: Not on file  Tobacco Use  . Smoking status: Never Smoker  . Smokeless tobacco: Never Used  . Tobacco comment: Never smoked  Substance and Sexual Activity  . Alcohol use: No    Alcohol/week: 0.0 standard drinks  . Drug use: No  . Sexual activity: Never    Birth control/protection: Surgical    Comment: hyst  Lifestyle  . Physical activity    Days per week: Not on file    Minutes per session: Not on file  . Stress: Not on file  Relationships  . Social Herbalist on phone: Not on file    Gets together: Not on file    Attends religious service: Not on file    Active member of club or organization: Not on file    Attends meetings of clubs or organizations: Not on file    Relationship status: Not on file  . Intimate partner violence    Fear of current or ex partner: Not on file    Emotionally abused: Not on file     Physically abused: Not on file    Forced sexual activity: Not on file  Other Topics Concern  . Not on file  Social History  Narrative  . Not on file     Vitals:   01/17/19 1358  BP: 122/60  Pulse: 68  Temp: 98.5 F (36.9 C)  SpO2: 97%  Weight: 145 lb (65.8 kg)  Height: 5\' 3"  (1.6 m)    Wt Readings from Last 3 Encounters:  01/17/19 145 lb (65.8 kg)  08/24/18 143 lb 8 oz (65.1 kg)  08/16/18 149 lb (67.6 kg)     PHYSICAL EXAM General: NAD HEENT: Normal. Neck: No JVD, no thyromegaly. Lungs: Clear to auscultation bilaterally with normal respiratory effort. CV: Regular rate and rhythm, normal S1/S2, no S3/S4, no murmur. No pretibial or periankle edema.  No carotid bruit.   Abdomen: Soft, nontender, no distention.  Neurologic: Alert and oriented.  Psych: Normal affect. Skin: Normal. Musculoskeletal: No gross deformities.    ECG: Reviewed above under Subjective   Labs: Lab Results  Component Value Date/Time   K 3.5 08/24/2018 11:14 PM   BUN 25 (H) 08/24/2018 11:14 PM   BUN 14 12/14/2017 01:16 PM   CREATININE 1.66 (H) 08/24/2018 11:14 PM   CREATININE 1.11 (H) 12/31/2017 08:50 AM   ALT 107 (H) 12/17/2017 10:43 AM   TSH 0.059 (L) 12/17/2017 06:27 PM   TSH 0.05 (L) 12/06/2017 08:09 AM   HGB 12.5 08/24/2018 11:14 PM   HGB 11.8 12/14/2017 01:16 PM     Lipids: Lab Results  Component Value Date/Time   LDLCALC 96 08/05/2017 08:07 AM   CHOL 166 08/05/2017 08:07 AM   TRIG 160 (H) 08/05/2017 08:07 AM   HDL 43 (L) 08/05/2017 08:07 AM       ASSESSMENT AND PLAN: 1.  Coronary artery disease: She has a history of prior PCI's(s/p PTCA of OM1 in 2016, DES to LCx in 2016, DES to RCA in 2017). Symptomatically stable.  Continue Lipitor, clopidogrel, andcarvedilol.  Low risk nuclear stress test as noted above.  2.AcceleratedHTN:Normal.  No changes.  3. Hyperlipidemia: Continue Lipitor 80 mg daily.  I will obtain a copy of lipids from PCP.  4. Chronic  diastolic heart failure: Euvolemic.Weight is 145 pounds. Continue Lasix40 mg twice daily.I instructed her to take an extra 40 mg Lasix if weightshould increase by 3 pounds in 24 hours or 5 pounds in a week. Also instructed her to increase potassium on those days as well.She has modified her diet considerably and tries to adhere to a low-sodium and low carbohydrate diet.  5.  Paroxysmal atrial fibrillationand flutter: Currently ina regularrhythm.DCCV performed in June 2019. Continue amiodarone and Eliquis as well as long-acting diltiazemand carvedilol.TSH0.059 on 12/17/2017.AST58and ALT elevated up to 107on 12/17/17.I will obtain a copy of most recent TSH and LFTs from PCP performed in the past few weeks.  6.  Tachybradycardia syndrome status post Medtronic dual-chamber pacemaker: Normal function.Implanted in January 2018.    Follows with EP.  7. OSA:She was not able to tolerate CPAP nor an oralmandibular advancement appliance.     Disposition: Follow up 1 yr   Kate Sable, M.D., F.A.C.C.

## 2019-01-19 IMAGING — DX DG CHEST 2V
2 series · 2 of 2 positions shown · non-contrast
Comparison: 11/25/2017.

CLINICAL DATA: Shortness of breath.

EXAM:
CHEST - 2 VIEW

[chest pa]
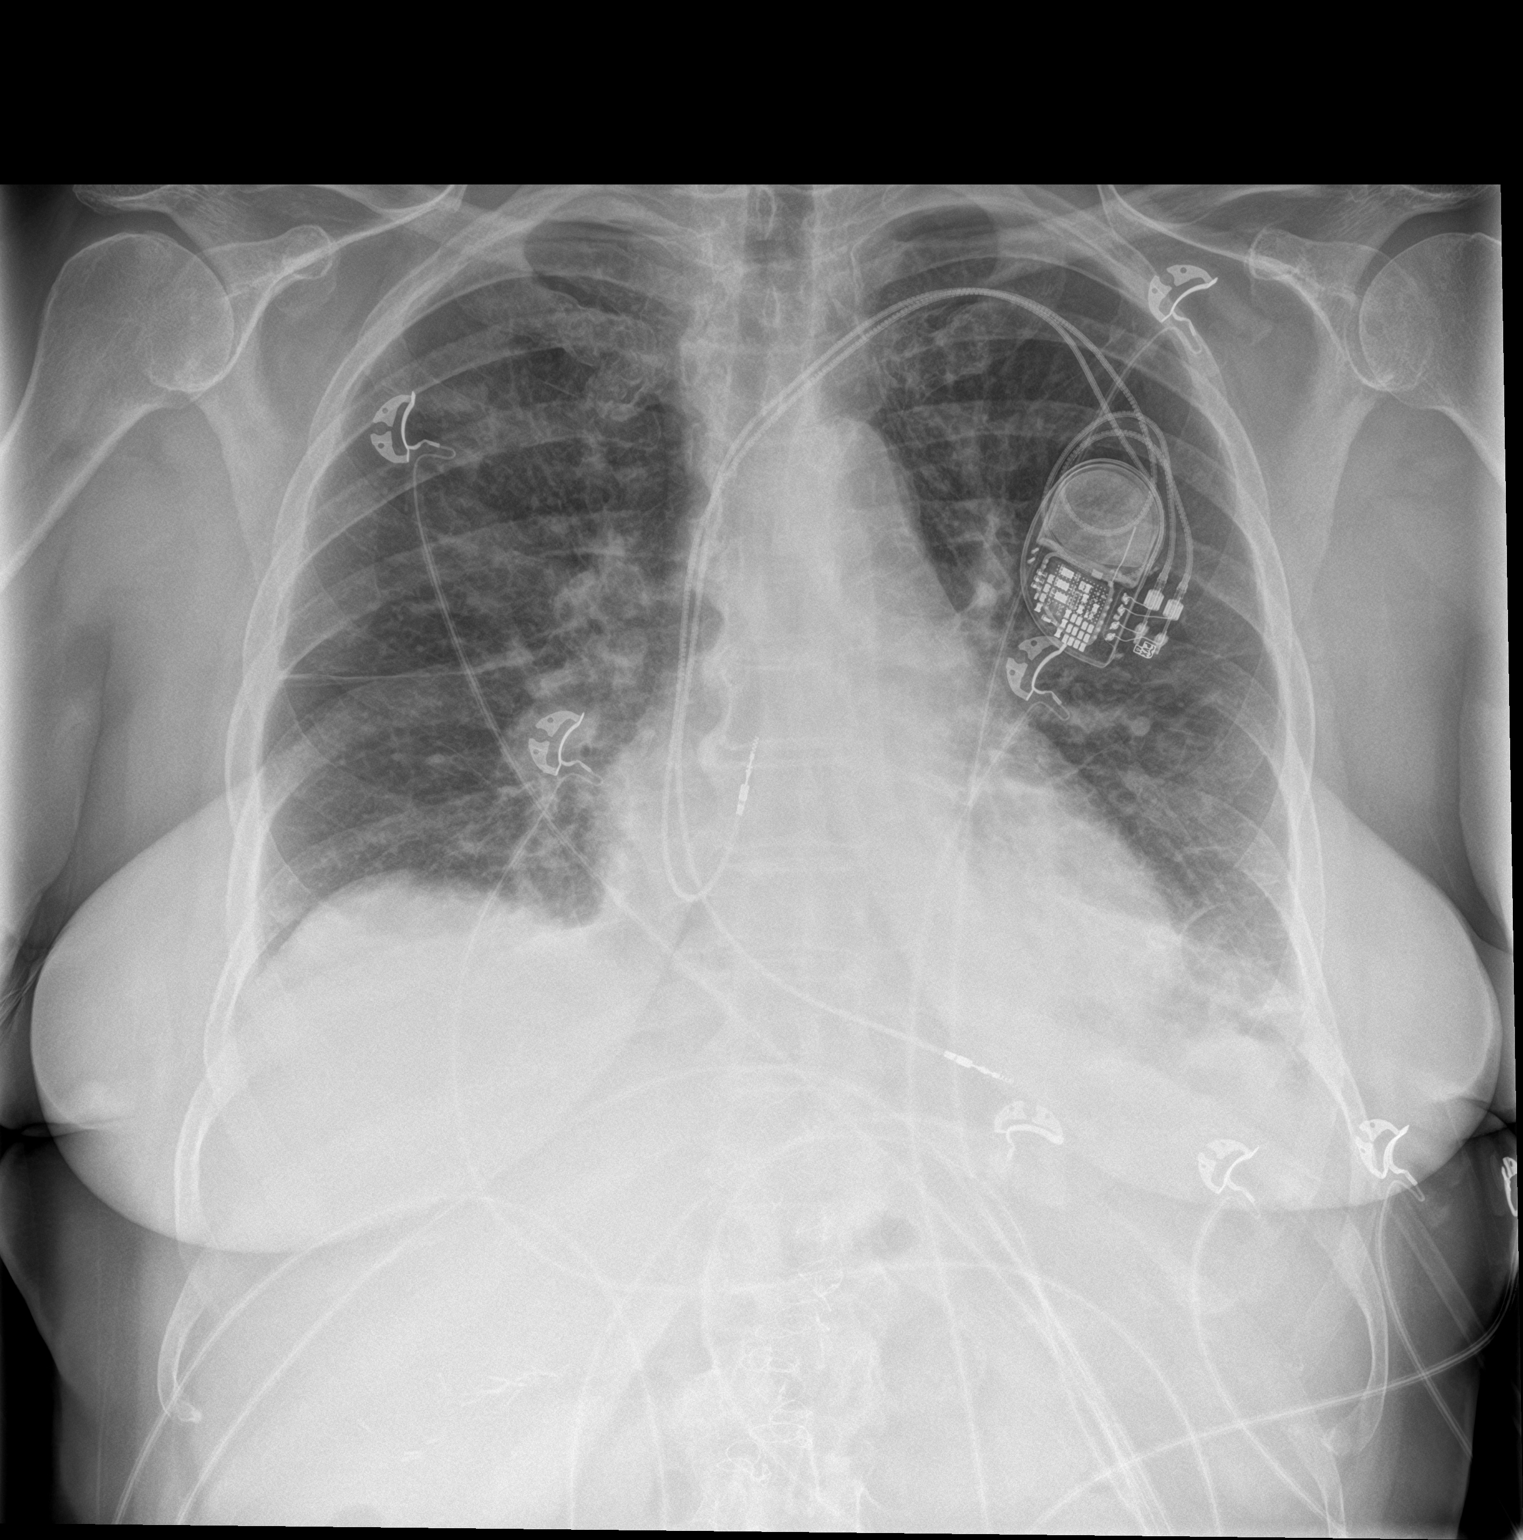

[chest lat]
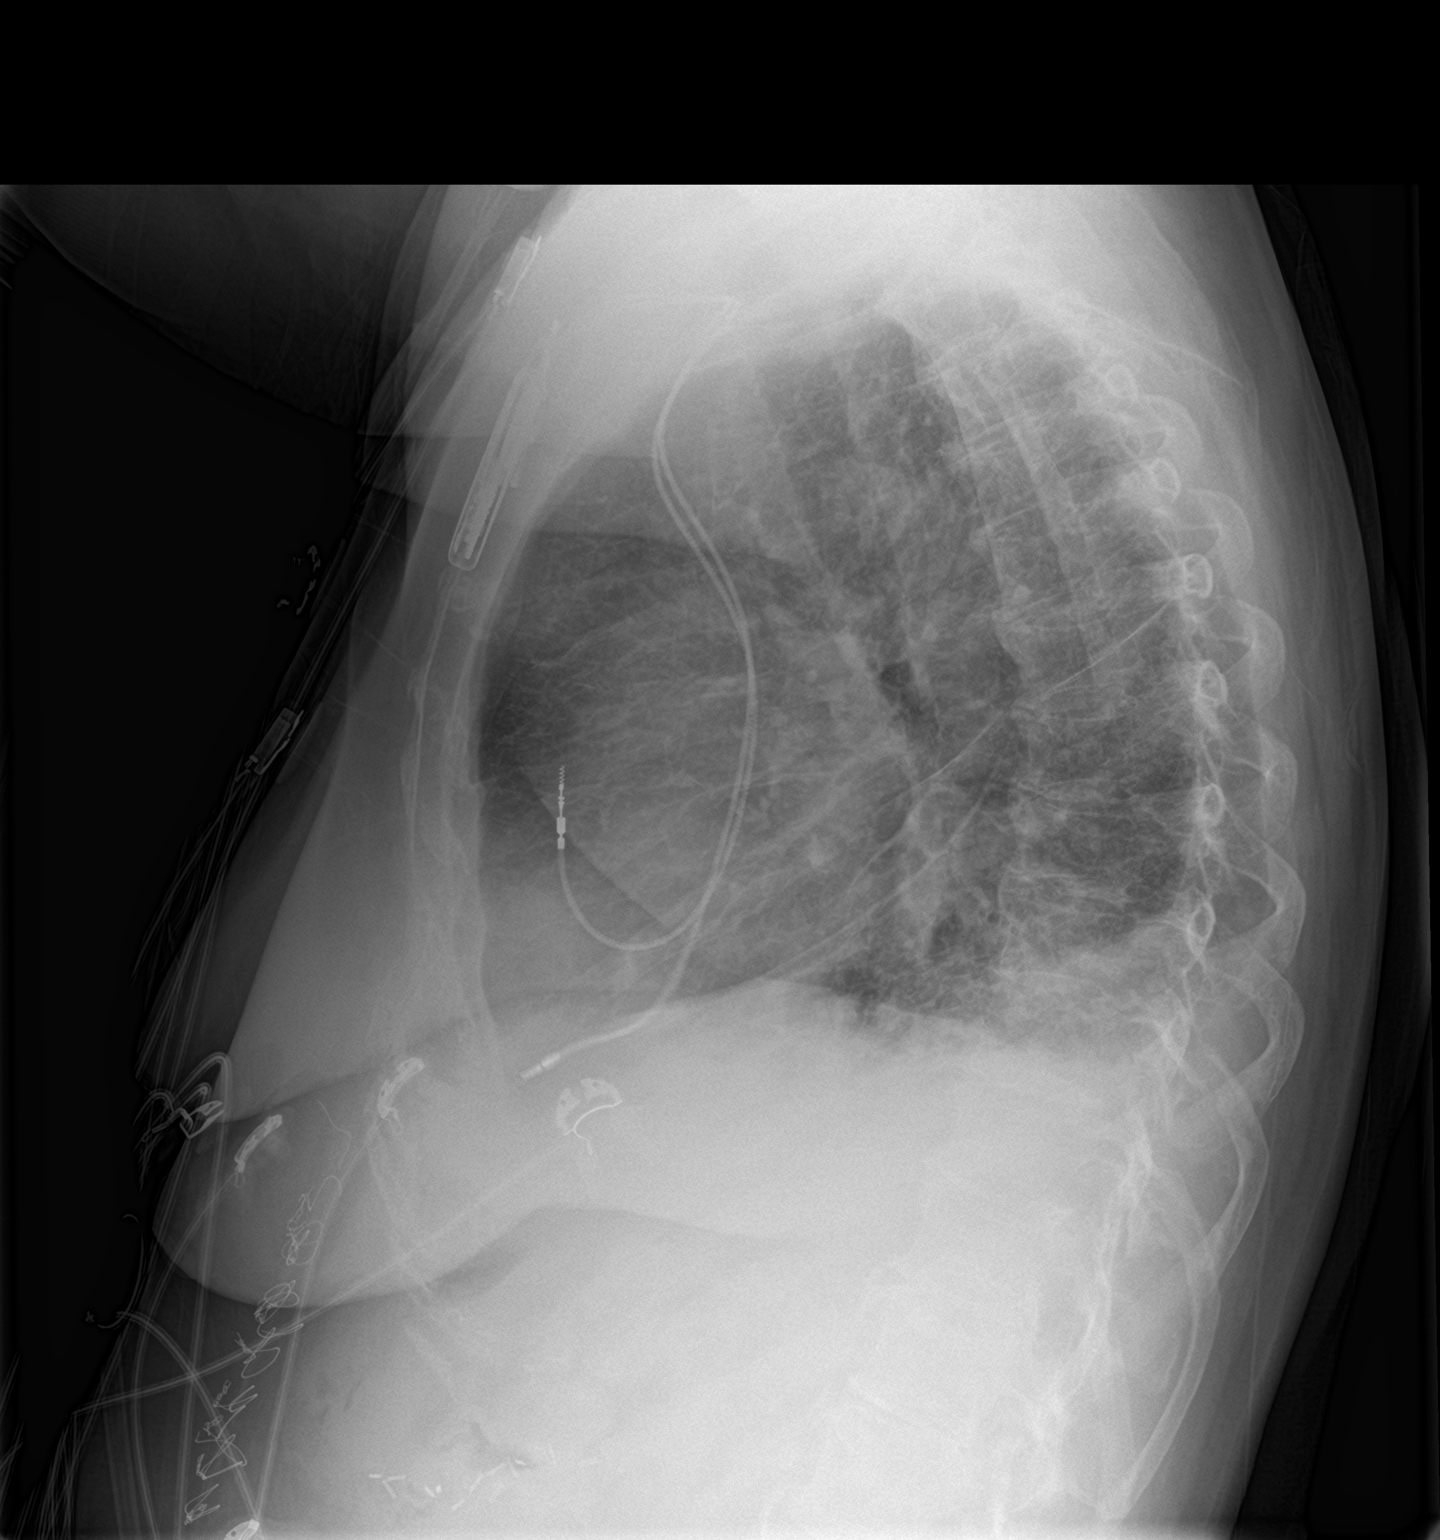

[2 of 2 positions shown; findings below may reference images not displayed]

FINDINGS: Cardiac pacer with lead tips over the right atrium right ventricle.
Heart size normal. Pulmonary vascularity normal. Diffuse bilateral
pulmonary interstitial prominence suggesting pneumonitis. CHF cannot
be excluded. Bibasilar atelectasis. Small left pleural effusion. No
pneumothorax.
IMPRESSION: 1. Cardiac pacer with lead tips over the right atrium right
ventricle. Heart size stable.

2. Diffuse bilateral pulmonary interstitial prominence suggesting
pneumonitis. CHF cannot be excluded. Bibasilar atelectasis. Small
left pleural effusion.

## 2019-01-20 ENCOUNTER — Encounter: Payer: Self-pay | Admitting: *Deleted

## 2019-01-22 IMAGING — DX DG CHEST 2V
2 series · 2 of 2 positions shown · non-contrast
Comparison: Radiographs 12/17/2017 and 11/25/2017.

CLINICAL DATA: Shortness of breath.

EXAM:
CHEST - 2 VIEW

[chest pa]
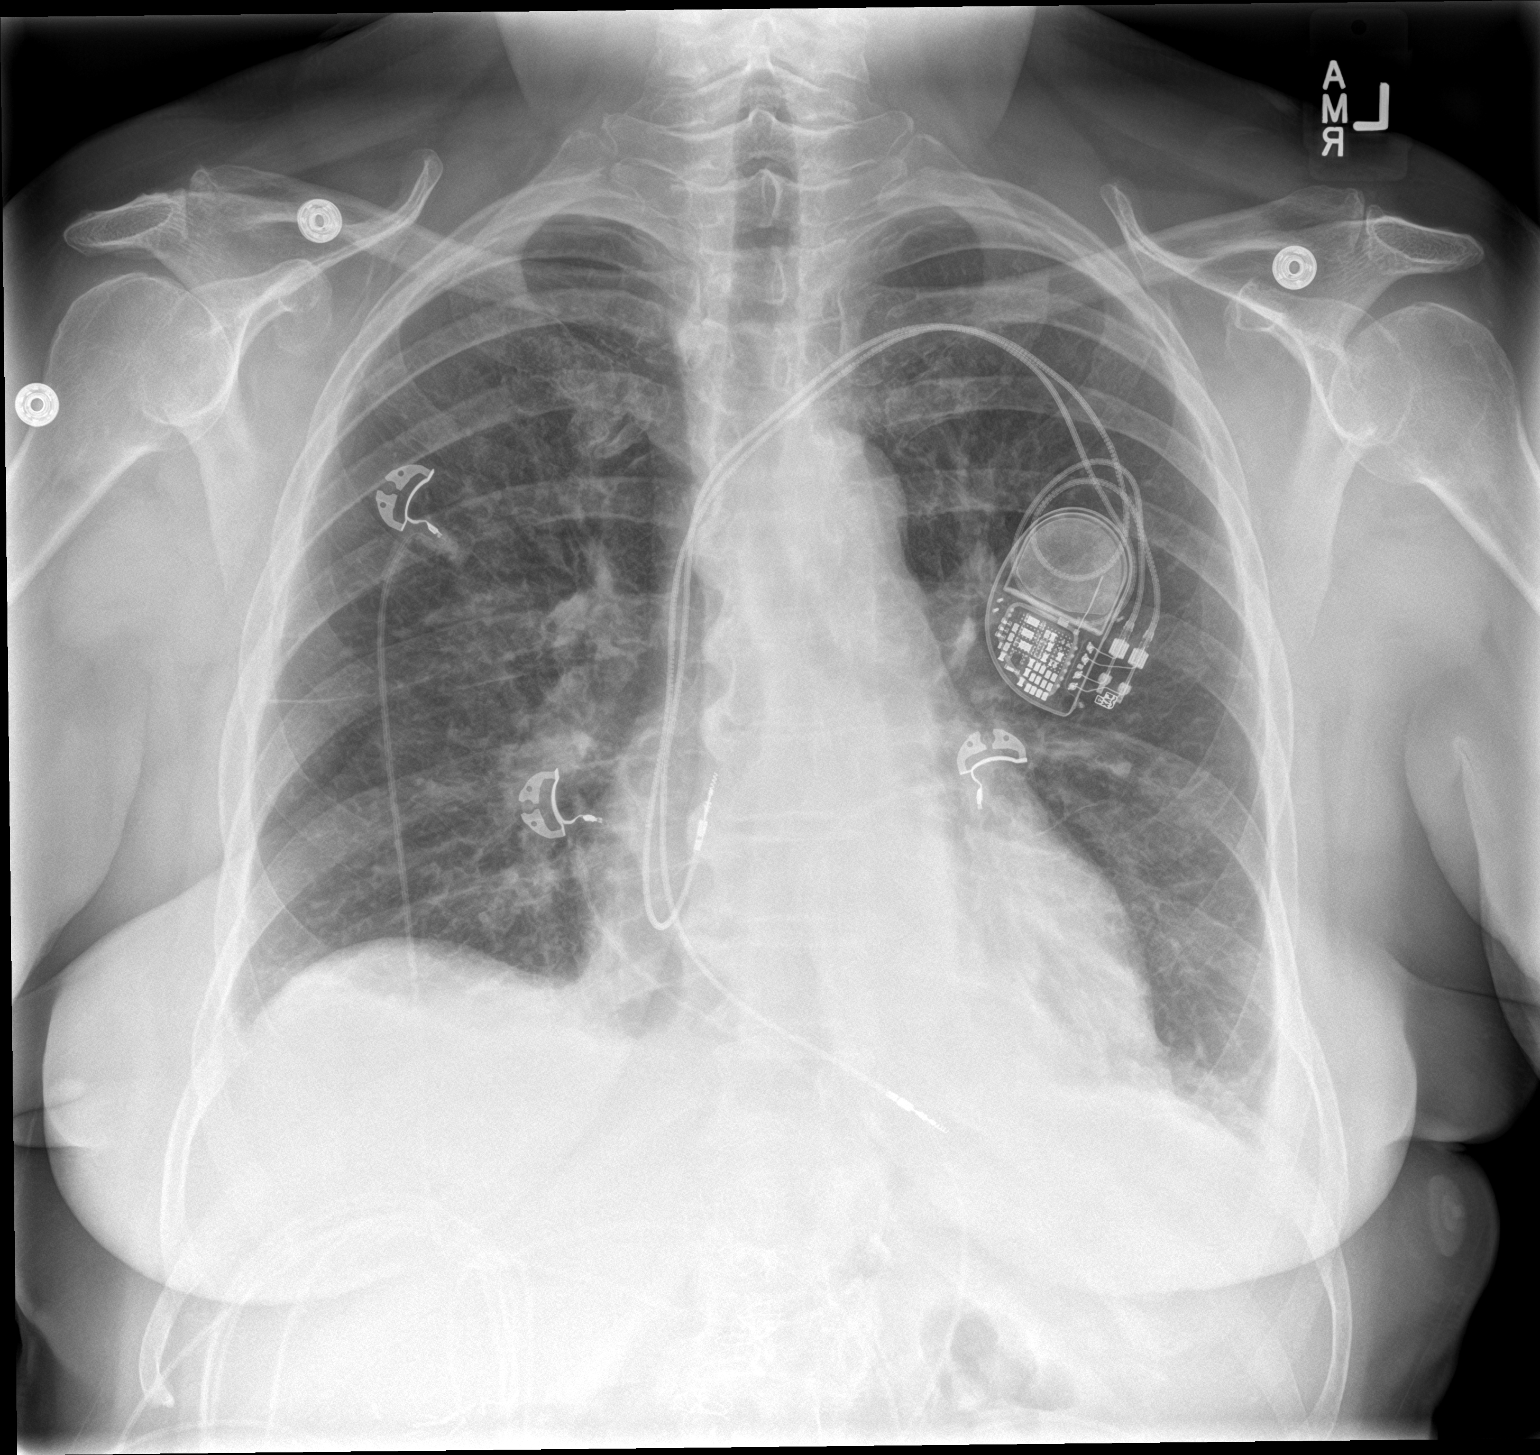

[chest lat]
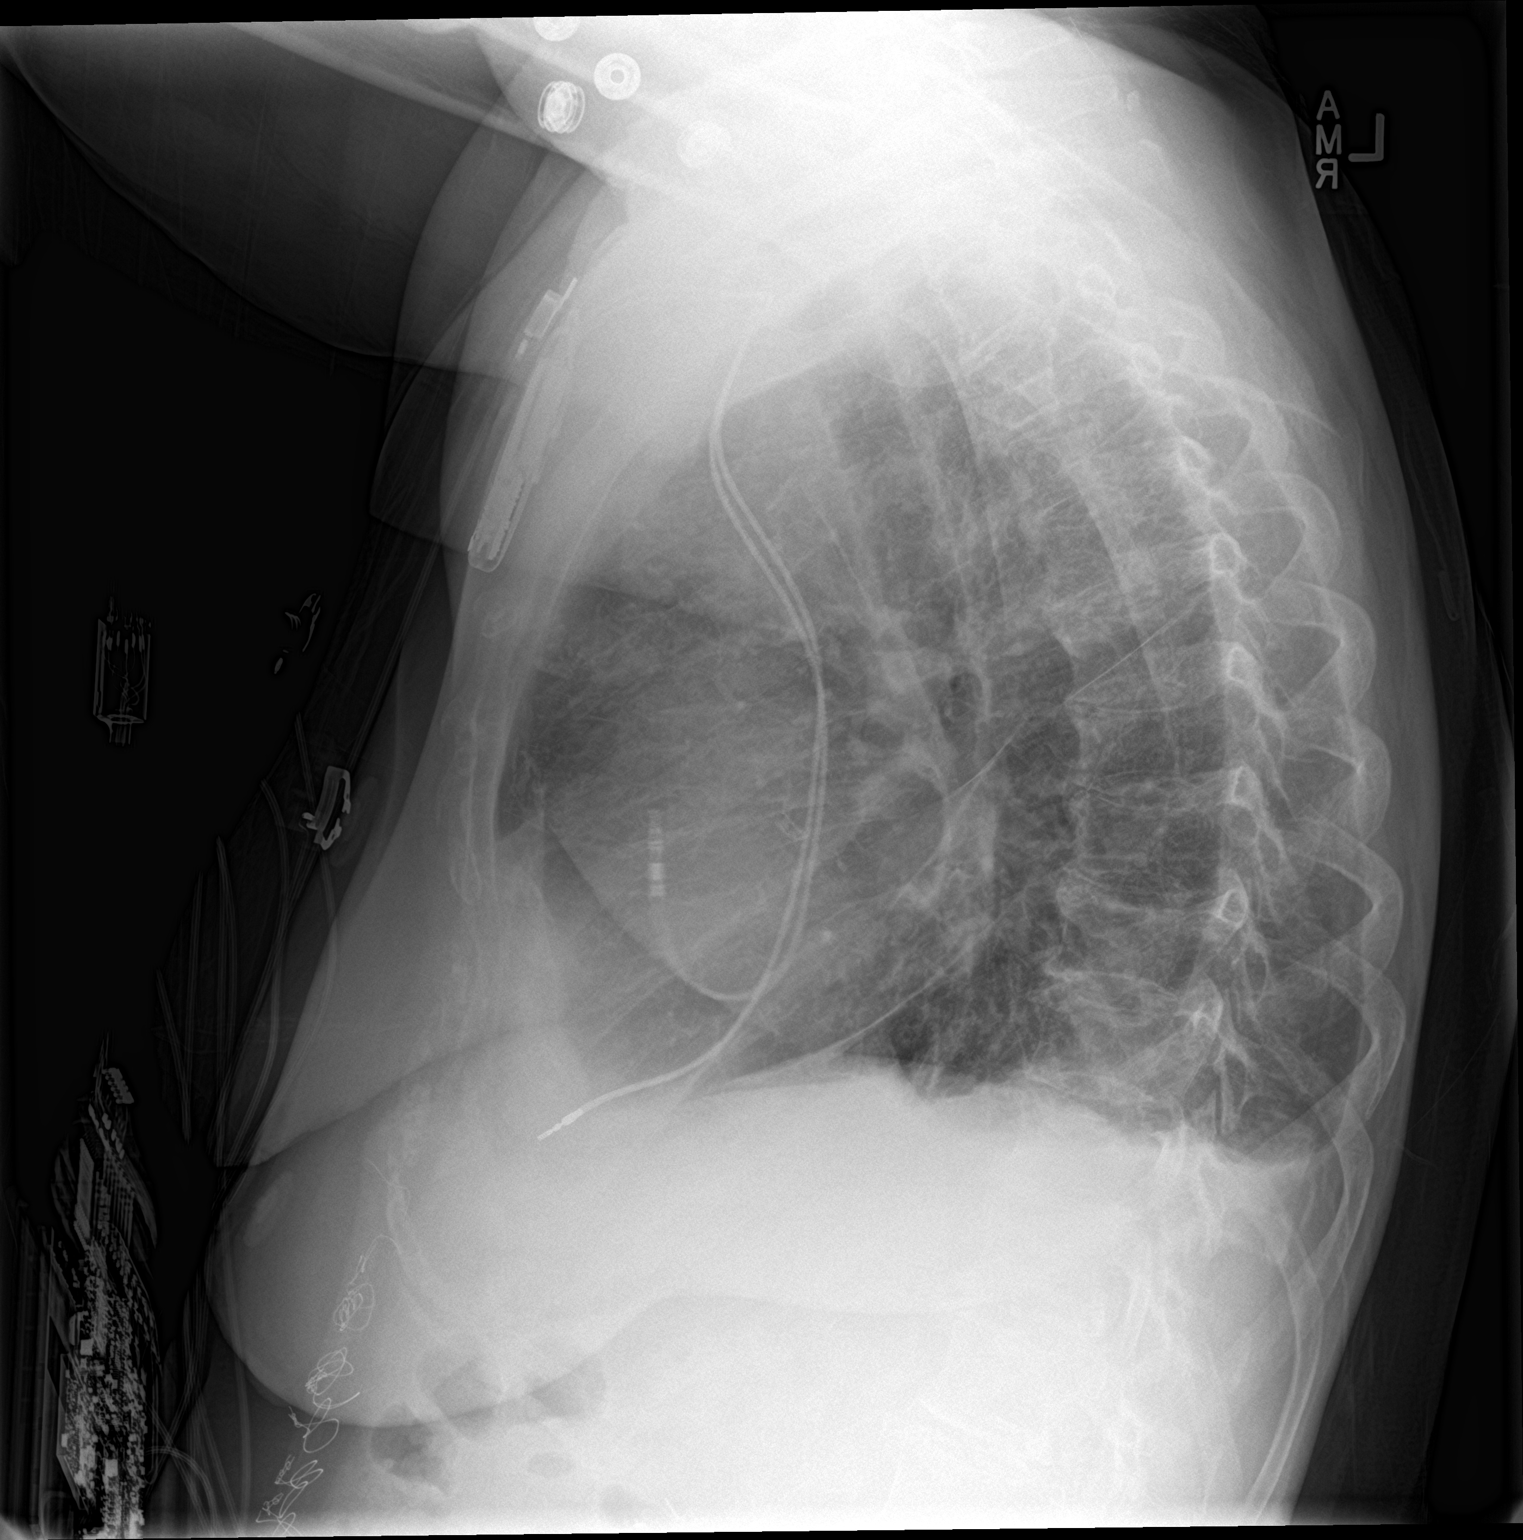

[2 of 2 positions shown; findings below may reference images not displayed]

FINDINGS: Left subclavian pacemaker leads appear unchanged within the right
atrium and right ventricle. The heart size and mediastinal contours
are stable. There is interval improved aeration of the lung bases
with mild residual patchy left lower lobe airspace disease and small
bilateral pleural effusions. There is no pneumothorax. The bones
appear unchanged with mild thoracic spine degenerative changes.
IMPRESSION: Improving bibasilar airspace opacities with persistent small pleural
effusions. Findings may reflect resolving pneumonitis or edema.

## 2019-01-23 ENCOUNTER — Other Ambulatory Visit: Payer: Self-pay | Admitting: Cardiology

## 2019-01-25 ENCOUNTER — Other Ambulatory Visit: Payer: Self-pay

## 2019-01-25 MED ORDER — DILTIAZEM HCL ER COATED BEADS 360 MG PO CP24: 360.0000 mg | ORAL_CAPSULE | Freq: Every day | ORAL | 3 refills | Status: DC

## 2019-02-07 DIAGNOSIS — H34831 Tributary (branch) retinal vein occlusion, right eye, with macular edema: Secondary | ICD-10-CM | POA: Diagnosis not present

## 2019-02-07 DIAGNOSIS — E113411 Type 2 diabetes mellitus with severe nonproliferative diabetic retinopathy with macular edema, right eye: Secondary | ICD-10-CM | POA: Diagnosis not present

## 2019-02-07 DIAGNOSIS — H3561 Retinal hemorrhage, right eye: Secondary | ICD-10-CM | POA: Diagnosis not present

## 2019-02-07 DIAGNOSIS — H348322 Tributary (branch) retinal vein occlusion, left eye, stable: Secondary | ICD-10-CM | POA: Diagnosis not present

## 2019-02-07 DIAGNOSIS — H35041 Retinal micro-aneurysms, unspecified, right eye: Secondary | ICD-10-CM | POA: Diagnosis not present

## 2019-02-08 ENCOUNTER — Other Ambulatory Visit: Payer: Self-pay

## 2019-02-08 ENCOUNTER — Ambulatory Visit (INDEPENDENT_AMBULATORY_CARE_PROVIDER_SITE_OTHER): Payer: PPO | Admitting: Neurology

## 2019-02-08 ENCOUNTER — Encounter: Payer: Self-pay | Admitting: Neurology

## 2019-02-08 VITALS — BP 171/79 | HR 77 | Temp 97.9°F | Ht 63.0 in | Wt 147.0 lb

## 2019-02-08 DIAGNOSIS — G620 Drug-induced polyneuropathy: Secondary | ICD-10-CM | POA: Diagnosis not present

## 2019-02-08 DIAGNOSIS — Z9989 Dependence on other enabling machines and devices: Secondary | ICD-10-CM

## 2019-02-08 DIAGNOSIS — G4733 Obstructive sleep apnea (adult) (pediatric): Secondary | ICD-10-CM | POA: Diagnosis not present

## 2019-02-08 DIAGNOSIS — I480 Paroxysmal atrial fibrillation: Secondary | ICD-10-CM

## 2019-02-08 MED ORDER — TRAZODONE HCL 50 MG PO TABS: 25.0000 mg | ORAL_TABLET | Freq: Every evening | ORAL | 1 refills | Status: DC | PRN

## 2019-02-08 NOTE — Progress Notes (Signed)
SLEEP MEDICINE CLINIC   Provider:  Larey Seat, M D  Referring Provider: Sharilyn Sites, MD Primary Care Physician:  Sharilyn Sites, MD  Chief Complaint  Patient presents with  . Follow-up    pt alone, rm 11, pt states that she is in need of a new mask/supplies. DME [facility]    HPI:  Virginia Huber is a 73 y.o. female patient , seen in a RV after sleep study and for CPAP compliance.  She reports her family is doing well, nobody has fallen sick during this pandemic. Her short term memory has been slowly declining and she has trouble to fall asleep and stay asleep. Her sleep study was performed elsewhere on 04-12-2016.  The patient continues to be a highly compliant CPAP user she has used her machine 28 over the last 30 days ending on 05 February 2019.  93% compliance for time and days with an average user time of 7 hours 27 minutes, she usually sleeps 8 hours or uses the machine 8 hours at night.  The pressure is set at 10 cmH2O her machine is provided by [employer].  She has an expiratory pressure relief setting of 3 cm and uses she has been using a full facemask with very little air leakage the 95th percentile is 1.2 L/min and a residual AHI is 1.0.  However she reports difficulties with falling asleep, staying asleep and with the mask fit by now.  She feels that she would be ready for change maybe trying a nasal mask or nasal cradle. Sometimes she drools at night.  Trazodone- 25 mg , a half tablet, she can increase to a full if needed. I will order a nasal mask, such as the wisp.      She had been originally seen here upon a referral from Dr. Kate Sable, Mrs. Sailer was originally  referred by her cardiologist for a sleep study to the [facility] location, she completed the sleep study but then was never given the results, a phone call or a follow-up appointment. She stated that she had similar experiences with the physician before when she had  suffered a TIA and her follow-up appointments were not properly made and results not discussed. I was surprised to see a patient who just had a sleep study on 04/12/2016 30 days later in my office to follow-up on a study that I have not performed. Based on the report the patient has a body mass index of 27 kg/m5, is 73 years of age, and went to the sleep lab 04/12/2016. Her AHI was only 10 per hour which constitutes mild sleep apnea. In supine sleep her AHI was 14.3, the lowest oxygen saturation at night was 86% and the technologist noted loud snoring. She slept about half of the night in supine sleep position she reached REM sleep with an AHI of 29.3 per hour.  Between June 2016 and May 2017 this patient had been admitted 5 times to hospital with atrial fibrillation and related to diastolic output failure. As she is on high embolic risk by A. fib and the related congestive heart failure, she remains on Eloquis and Plavix. Given the constellation of mild apnea with underlying  CAD , MI 4 years ago, double pneumonia in 2015, atrial fibrillation and previous stroke-TIA, congestive heart failure and REM dependent apnea, she needs to have an urgent CPAP titration.   Sleep habits are as follows:Usual bedtime for the couple is between 12 and 1 AM, usually  but neither of them has trouble to fall asleep and Mrs. Rounds reports that she rises usually at 8 or 9 AM. Since she is retired she has time to sleep in. She sleeps almost equal times in supine and lateral position, she sleeps on 2 pillows for support. The bedroom is described as core, quiet and dark. Her husband uses a CPAP machine, she has nocturia frequent up to 3 times at night, fragmenting her sleep. She feels that her sleep is not restorative and refreshing and that she is a light sleeper, thus  easily awoken. Her husband has noted her to snore very loudly as a [employer]chant [employer] officer had reported. She often feels she hasn't slept at all because she lacks the sleeps  refreshing and restorative qualities. So when she wakes up in the morning she is not necessarily feeling ready to go. She wakes up frequently with a headache in the morning indicating that she may have hypoxemia or hypercapnia. She was not checked for hypercapnia in her recent sleep study.  Sleep medical history and family sleep history:  Non family history of OSA  Social history: married, patient is a nontobacco user, has never smoked. She does not drink alcohol. She is using only decaffeinated coffees and teas. No shift work history.   Interval history from the 13's of March 2018, The patient underwent a sleep study on 06/08/2016, designated CPAP titration following an outside baseline sleep study. The  total AHI was reduced to only 2.2 / hr under CPAP titration , SpO2  saturation  at or below 88% was 0.0 minutes .  There were no periodic limb movements. We ordered a CPAP for this patient with the set pressure of 10 cm water, she had a rather poor sleep efficiency under CPAP of 67% but has improved at home dramatically.  She continues now to use CPAP at 10 cm water pressure with 3 cm EPR and has reached  100% compliance for 30 days, 97% compliance for over 4 hours of consecutive use - she was hospitalized for 3 days within this time to receive a pacemaker. Her average user time is 8 hours and 4 minutes at night and her residual AHI is 2.1 -given her complicated cardiac medical history, I think is an excellent result. In addition she reports an Epworth sleepiness score of only 6 points fatigue severity score of only 21 points and the geriatric depression score endorsed at only 1 out of 15 points. She noticed much more daytime alertness, she wakes up not as frequently as before CPAP, she reports no morning headaches. Only once in a while but she still have a bathroom break. Altogether this has been a good result. She was also given a higher dose of diuretics and her ankle edema has markedly improved.     Virginia Huber is a 73 y.o. female , is seen here for a new problem on 12-08-2017 .  [name] has a difficult cardiac history, she had bradycardia with near syncope's as a pacemaker patient has been treated for atrial fibrillation which has paroxysmally occurred on and off and just recently had another bout of rapid ventricular response.  She is on hydralazine, potassium chloride, Plavix, fish oil ropinirole Lasix ipratropium bromide, levothyroxine, vitamin D Eliquis, diltiazem, Nitrostat, albuterol sulfate metformin, and was started on amiodarone.  Her main concern today is tremor unsteadiness frequent falls she also feels not quite in control of her limbs.  2 falls have led to a fracture of her hallux on  the right foot and to an ankle fracture or talus fracture left foot.  Her CPAP compliance is excellent, but this is a side line of today's visit.  CPAP compliance is 93%, 90% for time, average use of 7 hours 43 minutes, CPAP is set at 10 cmH2O with 3 cm expiratory pressure relief and a residual AHI of 2.6 there are very few minor air leaks.  There needs to be no adjustments made.  Last date of download was 12/07/2017.   Review of Systems: Out of a complete 14 system review, the patient complains of only the following symptoms, and all other reviewed systems are negative. Loud snoring, dependent odema, atrial fib, CHF, witnessed apnea, fatigue, TIA.    Fatigue severity score  21 from 37  , depression score 1 from  2/15 .  No flowsheet data found.  How likely are you to doze in the following situations: 0 = not likely, 1 = slight chance, 2 = moderate chance, 3 = high chance  Sitting and Reading? 1 Watching Television? 1 Sitting inactive in a public place (theater or meeting)? Lying down in the afternoon when circumstances permit? 1 Sitting and talking to someone? Sitting quietly after lunch without alcohol? 1 In a car, while stopped for a few minutes in traffic? As a passenger in a car for  an hour without a break?  Total = 4/ 24      Social History   Socioeconomic History  . Marital status: Married    Spouse name: Not on file  . Number of children: Not on file  . Years of education: Not on file  . Highest education level: Not on file  Occupational History  . Occupation: Retired    Fish farm manager: RETIRED    Comment: Research officer, political party  Social Needs  . Financial resource strain: Not on file  . Food insecurity    Worry: Not on file    Inability: Not on file  . Transportation needs    Medical: Not on file    Non-medical: Not on file  Tobacco Use  . Smoking status: Never Smoker  . Smokeless tobacco: Never Used  . Tobacco comment: Never smoked  Substance and Sexual Activity  . Alcohol use: No    Alcohol/week: 0.0 standard drinks  . Drug use: No  . Sexual activity: Never    Birth control/protection: Surgical    Comment: hyst  Lifestyle  . Physical activity    Days per week: Not on file    Minutes per session: Not on file  . Stress: Not on file  Relationships  . Social Herbalist on phone: Not on file    Gets together: Not on file    Attends religious service: Not on file    Active member of club or organization: Not on file    Attends meetings of clubs or organizations: Not on file    Relationship status: Not on file  . Intimate partner violence    Fear of current or ex partner: Not on file    Emotionally abused: Not on file    Physically abused: Not on file    Forced sexual activity: Not on file  Other Topics Concern  . Not on file  Social History Narrative  . Not on file    Family History  Problem Relation Age of Onset  . Heart disease Mother        deceased  . Heart disease Father  deceased, heart disease  . Diabetes Brother   . Heart disease Brother   . Thyroid disease Brother   . Heart disease Sister   . Heart disease Brother   . Thyroid disease Brother   . Lupus Daughter   . Colon cancer Neg Hx     Past Medical  History:  Diagnosis Date  . Arthritis   . Atrial fibrillation and flutter ([place])    a. h/o PAF/flutter during admission in 2013 for PNA. b. PAF during adm for NSTEMI 07/2015, subsequent paroxysms since then.  . B12 deficiency anemia   . Cardiac tamponade 06/2016  . CHF (congestive heart failure) ([place])   . Coronary artery disease 11/30/2014   a. remote MI. b. h/o PTCA with scoring balloon to OM1 11/2014. c. NSTEMI 03/2015 s/p DES to prox-mid Cx. d. NSTEMI 07/2015 s/p scoring balloon/PTCA/DES to dRCA with PAF during that admission  . Cutaneous lupus erythematosus   . GERD (gastroesophageal reflux disease)   . Heart block   . History of blood transfusion 1980's   2nd surgical procedures  . HTN (hypertension)   . Hypercholesteremia   . Hypothyroidism   . Myocardial infarction ([place]) 02/2012  . Ovarian tumor   . PAD (peripheral artery disease) ([place])    a. s/p LE angio 2015; followed by Dr. Fletcher Anon - managed medically.  . Pain with urination 05/08/2015  . Paroxysmal atrial flutter ([place])   . Pericardial effusion    a. 06/2016 after ppm - s/p pericardiocentesis.  . Superficial fungus infection of skin 06/29/2013  . Tachy-brady syndrome ([place])    a. s/p Medtronic PPM 06/2016, c/b lead perf/pericardial effusion.  [name] TIA (transient ischemic attack) 08/2001; ~ 2006  . Type II diabetes mellitus ([place])   . UTI (urinary tract infection) 05/08/2013    Past Surgical History:  Procedure Laterality Date  . ABDOMINAL AORTAGRAM N/A 01/03/2014   Procedure: ABDOMINAL [name];  Surgeon: Wellington Hampshire, MD;  Location: [facility] CATH LAB;  Service: Cardiovascular;  Laterality: N/A;  . ABDOMINAL HYSTERECTOMY  1972   "partial"  . APPENDECTOMY  1970's  . CARDIAC CATHETERIZATION  2008   Tiny OM-2 with 90% narrowing. Med tx.  [name] CARDIAC CATHETERIZATION N/A 11/30/2014   Procedure: Left Heart Cath and Coronary Angiography;  Surgeon: Troy Sine, MD; LAD 20%, CFX 50%, OM1 95%, right PLB 30%, LV normal   . CARDIAC  CATHETERIZATION N/A 11/30/2014   Procedure: Coronary Balloon Angioplasty;  Surgeon: Troy Sine, MD;  Angiosculpt scoring balloon and PTCA to the OM1 reducing stenosis from 95% to less than 10%  . CARDIAC CATHETERIZATION N/A 04/03/2015   Procedure: Left Heart Cath and Coronary Angiography;  Surgeon: Jolaine Artist, MD; dLAD 50%, CFX 90%, OM1 100%, PLA 15%, LVEDP 13    . CARDIAC CATHETERIZATION N/A 04/03/2015   Procedure: Coronary Stent Intervention;  Surgeon: Sherren Mocha, MD; 3.0x18 mm Xience DES to the CFX    . CARDIAC CATHETERIZATION N/A 08/02/2015   Procedure: Left Heart Cath and Coronary Angiography;  Surgeon: Troy Sine, MD;  Location: [facility] CV LAB;  Service: Cardiovascular;  Laterality: N/A;  . CARDIAC CATHETERIZATION N/A 08/02/2015   Procedure: Coronary Stent Intervention;  Surgeon: Troy Sine, MD;  Location: [facility] CV LAB;  Service: Cardiovascular;  Laterality: N/A;  . CARDIAC CATHETERIZATION N/A 06/25/2016   Procedure: Pericardiocentesis;  Surgeon: Will Meredith Leeds, MD;  Location: [facility] CV LAB;  Service: Cardiovascular;  Laterality: N/A;  . cardiac stents    .  CARDIOVERSION N/A 12/15/2017   Procedure: CARDIOVERSION;  Surgeon: Acie Fredrickson Wonda Cheng, MD;  Location: [facility] ENDOSCOPY;  Service: Cardiovascular;  Laterality: N/A;  . CHOLECYSTECTOMY OPEN  1990's  . COLONOSCOPY  2005   Dr. Laural Golden: pancolonic divericula, polyp, path unknown currently  . COLONOSCOPY  2012   Dr. Oneida Alar: Normal TI, scattered diverticula in entire colon, small internal hemorrhoids, normal colon biopsies. Colonoscopy in 5-10 years.   . COLOSTOMY  05/1979  . COLOSTOMY REVERSAL  11/1979  . EP IMPLANTABLE DEVICE N/A 06/25/2016   Procedure: Lead Revision/Repair;  Surgeon: Will Meredith Leeds, MD;  Location: [facility] CV LAB;  Service: Cardiovascular;  Laterality: N/A;  . EP IMPLANTABLE DEVICE N/A 06/25/2016   Procedure: Pacemaker Implant;  Surgeon: Will Meredith Leeds, MD;  Location: [facility]  CV LAB;  Service: Cardiovascular;  Laterality: N/A;  . EXCISIONAL HEMORRHOIDECTOMY  1970's  . EYE SURGERY Left 2000   "branch vein occlusion"  . EYE SURGERY Left ~ 2001   "smoothed out wrinkle"  . LEFT OOPHORECTOMY  05/1979   nicked bowel, peritonitis, colostomy; colostomy reversed 1981   . LOWER EXTREMITY ANGIOGRAM N/A 01/03/2014   Procedure: LOWER EXTREMITY ANGIOGRAM;  Surgeon: Wellington Hampshire, MD;  Location: [facility] CATH LAB;  Service: Cardiovascular;  Laterality: N/A;  . Nuclear med stress test  10/2011   Small area of mild ischemia inferoapically.  [name] PARTIAL HYSTERECTOMY  1970's   left ovaries, then ovaries removed later due tumors   . RIGHT OOPHORECTOMY  1970's    Current Outpatient Medications  Medication Sig Dispense Refill  . acetaminophen (TYLENOL) 325 MG tablet Take 650 mg by mouth every 6 (six) hours as needed for headache.    [name] amiodarone (PACERONE) 200 MG tablet Take 0.5 tablets (100 mg total) by mouth daily. 45 tablet 1  . apixaban (ELIQUIS) 5 MG TABS tablet Take 1 tablet (5 mg total) by mouth 2 (two) times daily. 28 tablet 0  . atorvastatin (LIPITOR) 80 MG tablet TAKE ONE TABLET BY MOUTH EVERY DAY AT 6:00PM 90 tablet 3  . carvedilol (COREG) 6.25 MG tablet TAKE (6.25MG  TOTAL) BY MOUTH TWO TIMES DAILY 180 tablet 3  . Cholecalciferol (VITAMIN D) 2000 units tablet Take 2,000 Units by mouth daily.     . clopidogrel (PLAVIX) 75 MG tablet Take 1 tablet (75 mg total) by mouth daily. 90 tablet 1  . diltiazem (CARDIZEM CD) 360 MG 24 hr capsule Take 1 capsule (360 mg total) by mouth daily. 90 capsule 3  . diphenhydramine-acetaminophen (TYLENOL PM EXTRA STRENGTH) 25-500 MG TABS tablet Take 1 tablet by mouth at bedtime as needed (Take as directed).     . furosemide (LASIX) 40 MG tablet TAKE ONE TABLET (40MG  TOTAL) BY MOUTH TWO TIMES DAILY (Patient taking differently: Take 40 mg by mouth 2 (two) times daily. ) 180 tablet 4  . glucose blood (FREESTYLE LITE) test strip USE AS DIRECTED TWICE  DAILY. 100 each 5  . insulin NPH-regular Human (NOVOLIN 70/30) (70-30) 100 UNIT/ML injection Inject 40 Units into the skin See admin instructions. Inject 25 units before breakfast and 25 units before supper  - only if pre-meal blood glucose is above 90 mg/dL. (Patient taking differently: Inject 25 Units into the skin See admin instructions. Inject 20 units before breakfast and 10 units before supper  - only if pre-meal blood glucose is above 90 mg/dL.) 10 mL 11  . levothyroxine (SYNTHROID, LEVOTHROID) 150 MCG tablet Take 1 tablet (150 mcg total) by mouth daily before breakfast. 30  tablet 1  . losartan (COZAAR) 100 MG tablet TAKE ONE (1) TABLET BY MOUTH EVERY DAY 45 tablet 1  . metFORMIN (GLUCOPHAGE) 500 MG tablet TAKE ONE TABLET BY MOUTH TWICE A DAY WITH A MEAL (Patient taking differently: Take 500 mg by mouth 2 (two) times daily with a meal. ) 180 tablet 0  . nitroGLYCERIN (NITROSTAT) 0.4 MG SL tablet Place 1 tablet (0.4 mg total) under the tongue every 5 (five) minutes as needed for chest pain. 25 tablet 0  . potassium chloride SA (K-DUR) 20 MEQ tablet TAKE ONE (1) TABLET BY MOUTH EVERY DAY 90 tablet 1  . rOPINIRole (REQUIP) 0.5 MG tablet Take 0.5 mg by mouth at bedtime.   0   No current facility-administered medications for this visit.       Vitals: BP (!) 171/79   Pulse 77   Temp 97.9 F (36.6 C)   Ht 5\' 3"  (1.6 m)   Wt 147 lb (66.7 kg)   BMI 26.04 kg/m  Last Weight:  Wt Readings from Last 1 Encounters:  02/08/19 147 lb (66.7 kg)   DDU:KGUR mass index is 26.04 kg/m.     Last Height:   Ht Readings from Last 1 Encounters:  02/08/19 5\' 3"  (1.6 m)    Physical exam:  General: The patient is awake, alert and appears not in acute distress. The patient is well groomed. Head: Normocephalic, atraumatic. Neck is supple. Mallampati 3  neck circumference: 15.25 . Nasal airflow congested,  TMJ click not  evident . Retrognathia is not seen.  Cardiovascular:  Regular rate and rhythm,  without  murmurs or carotid bruit, and without distended neck veins. Respiratory: Lungs are clear to auscultation. Skin:  Without evidence of edema, or rash Trunk: BMI is 26 kg/m2.   Neurologic exam : The patient is awake and alert, oriented to place and time.    Attention span & concentration ability appears normal.   Speech is fluent,  without  dysarthria, dysphonia or aphasia.  Mood and affect are appropriate. She reports some memory loss- she appears rather a bit anxious and can tell me what she had for breakfast, when her last appointment was and which medications her sister takes...  she has a sister with dementia.   Cranial nerves: no loss of smell or taste.  Pupils are equal and briskly reactive to light.  Facial sensation intact to fine touch. Facial motor strength is symmetric and tongue and uvula move midline. Shoulder shrug was symmetrical.   Motor exam: she has new onset rigor and cog-wheeling in both biceps and at the wrists. Her hand have a minor tremor, not at rest, she was able to walk unassisted and shows normal arm swing, no pill rolling tremor, not stooped.  . Right hand grip strength is stronger than left, bilateral equal strength, no cogwheeling and no rigor. Sensory:  Fine touch, pinprick and vibration were reduced below the knee level, loss of primary modalities at the ankle level. Patient reports she has peripheral arterial peripheral vascular disease. (She is on amiodarone).  Coordination: Rapid alternating movements in the fingers/hands was normal. Finger-to-nose maneuver normal without evidence of ataxia, dysmetria or tremor. Gait and station: Patient walks without assistive device- she reports propulsive tendencies when bending- she fell into the dishwasher. Strength within normal limits.  Stance is stable and normal.   Deep tendon reflexes: in the upper and lower extremities are attenuated.The patient was advised of the nature of the diagnosed disorder , the  treatment options  and risks for general a health and wellness arising from not treating the condition.  I spent more than 25  minutes of face to face time with the patient. Greater than 50% of time was spent in counseling and coordination of care. We have discussed the diagnosis and differential and I answered the patient's questions.    Assessment:  After physical and neurologic examination, review of laboratory studies,  Personal review of imaging studies, reports of other /same  Imaging studies ,  Results of polysomnography/ neurophysiology testing and pre-existing records as far as provided in visit., my assessment is   1) Patient has peripheral arterial disease, ankle edema,  atrial fibrillation, paroxysmal atrial fibrillation with high embolism risk, is on chronic anticoagulation, with a history of 5 admissions in 12 months for atrial fibrillation related to congestive heart failure, in addition history of TIA and stroke, retinal vein occlusion, coronary artery disease and myocardial infarction.   2)She has pseudo-parkinson tremor and rigor bilaterally- over both biceps and wrists. No associted romberg, no pill rolling tremor, no loss of facial mimik or rare blink reflex.  I believe this is a side effect of AMIODARONE and a recent reduction in dose has improved tremor dramatically.   3) OSA treated at 10 cm water pressure CPAP with FFM , [employer] SIMPLUS in small size. She is doing well.    4) Insomnia , discomfort with mask. I like to address her insomnia and pressure marks by prescribing Trazodone and changing her to a wisp nasal mask.   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  Stay on CPAP, machine is only 74 years old.  Trazodone Change mask to wisp.   RV with me or Np in 12 month    Asencion Partridge Elmira Olkowski MD  02/08/2019   CC: Sharilyn Sites,  [address] [place],  [place] [address]

## 2019-02-08 NOTE — Patient Instructions (Signed)
Trazodone tablets What is this medicine? TRAZODONE (TRAZ oh done) is used to treat depression. This medicine may be used for other purposes; ask your health care provider or pharmacist if you have questions. COMMON BRAND NAME(S): Desyrel What should I tell my health care provider before I take this medicine? They need to know if you have any of these conditions:  attempted suicide or thinking about it  bipolar disorder  bleeding problems  glaucoma  heart disease, or previous heart attack  irregular heart beat  kidney or liver disease  low levels of sodium in the blood  an unusual or allergic reaction to trazodone, other medicines, foods, dyes or preservatives  pregnant or trying to get pregnant  breast-feeding How should I use this medicine? Take this medicine by mouth with a glass of water. Follow the directions on the prescription label. Take this medicine shortly after a meal or a light snack. Take your medicine at regular intervals. Do not take your medicine more often than directed. Do not stop taking this medicine suddenly except upon the advice of your doctor. Stopping this medicine too quickly may cause serious side effects or your condition may worsen. A special MedGuide will be given to you by the pharmacist with each prescription and refill. Be sure to read this information carefully each time. Talk to your pediatrician regarding the use of this medicine in children. Special care may be needed. Overdosage: If you think you have taken too much of this medicine contact a poison control center or emergency room at once. NOTE: This medicine is only for you. Do not share this medicine with others. What if I miss a dose? If you miss a dose, take it as soon as you can. If it is almost time for your next dose, take only that dose. Do not take double or extra doses. What may interact with this medicine? Do not take this medicine with any of the following medications:   certain medicines for fungal infections like fluconazole, itraconazole, ketoconazole, posaconazole, voriconazole  cisapride  dronedarone  linezolid  MAOIs like Carbex, Eldepryl, Marplan, Nardil, and Parnate  mesoridazine  methylene blue (injected into a vein)  pimozide  saquinavir  thioridazine This medicine may also interact with the following medications:  alcohol  antiviral medicines for HIV or AIDS  aspirin and aspirin-like medicines  barbiturates like phenobarbital  certain medicines for blood pressure, heart disease, irregular heart beat  certain medicines for depression, anxiety, or psychotic disturbances  certain medicines for migraine headache like almotriptan, eletriptan, frovatriptan, naratriptan, rizatriptan, sumatriptan, zolmitriptan  certain medicines for seizures like carbamazepine and phenytoin  certain medicines for sleep  certain medicines that treat or prevent blood clots like dalteparin, enoxaparin, warfarin  digoxin  fentanyl  lithium  NSAIDS, medicines for pain and inflammation, like ibuprofen or naproxen  other medicines that prolong the QT interval (cause an abnormal heart rhythm) like dofetilide  rasagiline  supplements like St. John's wort, kava kava, valerian  tramadol  tryptophan This list may not describe all possible interactions. Give your health care provider a list of all the medicines, herbs, non-prescription drugs, or dietary supplements you use. Also tell them if you smoke, drink alcohol, or use illegal drugs. Some items may interact with your medicine. What should I watch for while using this medicine? Tell your doctor if your symptoms do not get better or if they get worse. Visit your doctor or health care professional for regular checks on your progress. Because it may take  several weeks to see the full effects of this medicine, it is important to continue your treatment as prescribed by your doctor. Patients and  their families should watch out for new or worsening thoughts of suicide or depression. Also watch out for sudden changes in feelings such as feeling anxious, agitated, panicky, irritable, hostile, aggressive, impulsive, severely restless, overly excited and hyperactive, or not being able to sleep. If this happens, especially at the beginning of treatment or after a change in dose, call your health care professional. Dennis Bast may get drowsy or dizzy. Do not drive, use machinery, or do anything that needs mental alertness until you know how this medicine affects you. Do not stand or sit up quickly, especially if you are an older patient. This reduces the risk of dizzy or fainting spells. Alcohol may interfere with the effect of this medicine. Avoid alcoholic drinks. This medicine may cause dry eyes and blurred vision. If you wear contact lenses you may feel some discomfort. Lubricating drops may help. See your eye doctor if the problem does not go away or is severe. Your mouth may get dry. Chewing sugarless gum, sucking hard candy and drinking plenty of water may help. Contact your doctor if the problem does not go away or is severe. What side effects may I notice from receiving this medicine? Side effects that you should report to your doctor or health care professional as soon as possible:  allergic reactions like skin rash, itching or hives, swelling of the face, lips, or tongue  elevated mood, decreased need for sleep, racing thoughts, impulsive behavior  confusion  fast, irregular heartbeat  feeling faint or lightheaded, falls  feeling agitated, angry, or irritable  loss of balance or coordination  painful or prolonged erections  restlessness, pacing, inability to keep still  suicidal thoughts or other mood changes  tremors  trouble sleeping  seizures  unusual bleeding or bruising Side effects that usually do not require medical attention (report to your doctor or health care  professional if they continue or are bothersome):  change in sex drive or performance  change in appetite or weight  constipation  headache  muscle aches or pains  nausea This list may not describe all possible side effects. Call your doctor for medical advice about side effects. You may report side effects to FDA at 1-800-FDA-1088. Where should I keep my medicine? Keep out of the reach of children. Store at room temperature between 15 and 30 degrees C (59 to 86 degrees F). Protect from light. Keep container tightly closed. Throw away any unused medicine after the expiration date. NOTE: This sheet is a summary. It may not cover all possible information. If you have questions about this medicine, talk to your doctor, pharmacist, or health care provider.  2020 Elsevier/Gold Standard (2018-05-31 11:46:46) Insomnia Insomnia is a sleep disorder that makes it difficult to fall asleep or stay asleep. Insomnia can cause fatigue, low energy, difficulty concentrating, mood swings, and poor performance at work or school. There are three different ways to classify insomnia:  Difficulty falling asleep.  Difficulty staying asleep.  Waking up too early in the morning. Any type of insomnia can be long-term (chronic) or short-term (acute). Both are common. Short-term insomnia usually lasts for three months or less. Chronic insomnia occurs at least three times a week for longer than three months. What are the causes? Insomnia may be caused by another condition, situation, or substance, such as:  Anxiety.  Certain medicines.  Gastroesophageal reflux disease (  GERD) or other gastrointestinal conditions.  Asthma or other breathing conditions.  Restless legs syndrome, sleep apnea, or other sleep disorders.  Chronic pain.  Menopause.  Stroke.  Abuse of alcohol, tobacco, or illegal drugs.  Mental health conditions, such as depression.  Caffeine.  Neurological disorders, such as  Alzheimer's disease.  An overactive thyroid (hyperthyroidism). Sometimes, the cause of insomnia may not be known. What increases the risk? Risk factors for insomnia include:  Gender. Women are affected more often than men.  Age. Insomnia is more common as you get older.  Stress.  Lack of exercise.  Irregular work schedule or working night shifts.  Traveling between different time zones.  Certain medical and mental health conditions. What are the signs or symptoms? If you have insomnia, the main symptom is having trouble falling asleep or having trouble staying asleep. This may lead to other symptoms, such as:  Feeling fatigued or having low energy.  Feeling nervous about going to sleep.  Not feeling rested in the morning.  Having trouble concentrating.  Feeling irritable, anxious, or depressed. How is this diagnosed? This condition may be diagnosed based on:  Your symptoms and medical history. Your health care provider may ask about: ? Your sleep habits. ? Any medical conditions you have. ? Your mental health.  A physical exam. How is this treated? Treatment for insomnia depends on the cause. Treatment may focus on treating an underlying condition that is causing insomnia. Treatment may also include:  Medicines to help you sleep.  Counseling or therapy.  Lifestyle adjustments to help you sleep better. Follow these instructions at home: Eating and drinking   Limit or avoid alcohol, caffeinated beverages, and cigarettes, especially close to bedtime. These can disrupt your sleep.  Do not eat a large meal or eat spicy foods right before bedtime. This can lead to digestive discomfort that can make it hard for you to sleep. Sleep habits   Keep a sleep diary to help you and your health care provider figure out what could be causing your insomnia. Write down: ? When you sleep. ? When you wake up during the night. ? How well you sleep. ? How rested you feel the  next day. ? Any side effects of medicines you are taking. ? What you eat and drink.  Make your bedroom a dark, comfortable place where it is easy to fall asleep. ? Put up shades or blackout curtains to block light from outside. ? Use a white noise machine to block noise. ? Keep the temperature cool.  Limit screen use before bedtime. This includes: ? Watching TV. ? Using your smartphone, tablet, or computer.  Stick to a routine that includes going to bed and waking up at the same times every day and night. This can help you fall asleep faster. Consider making a quiet activity, such as reading, part of your nighttime routine.  Try to avoid taking naps during the day so that you sleep better at night.  Get out of bed if you are still awake after 15 minutes of trying to sleep. Keep the lights down, but try reading or doing a quiet activity. When you feel sleepy, go back to bed. General instructions  Take over-the-counter and prescription medicines only as told by your health care provider.  Exercise regularly, as told by your health care provider. Avoid exercise starting several hours before bedtime.  Use relaxation techniques to manage stress. Ask your health care provider to suggest some techniques that may work well  for you. These may include: ? Breathing exercises. ? Routines to release muscle tension. ? Visualizing peaceful scenes.  Make sure that you drive carefully. Avoid driving if you feel very sleepy.  Keep all follow-up visits as told by your health care provider. This is important. Contact a health care provider if:  You are tired throughout the day.  You have trouble in your daily routine due to sleepiness.  You continue to have sleep problems, or your sleep problems get worse. Get help right away if:  You have serious thoughts about hurting yourself or someone else. If you ever feel like you may hurt yourself or others, or have thoughts about taking your own life,  get help right away. You can go to your nearest emergency department or call:  Your local emergency services (911 in the [place]  A suicide crisis helpline, such as the [place] at 917-401-9953. This is open 24 hours a day. Summary  Insomnia is a sleep disorder that makes it difficult to fall asleep or stay asleep.  Insomnia can be long-term (chronic) or short-term (acute).  Treatment for insomnia depends on the cause. Treatment may focus on treating an underlying condition that is causing insomnia.  Keep a sleep diary to help you and your health care provider figure out what could be causing your insomnia. This information is not intended to replace advice given to you by your health care provider. Make sure you discuss any questions you have with your health care provider. Document Released: 06/05/2000 Document Revised: 05/21/2017 Document Reviewed: 03/18/2017 Elsevier Patient Education  2020 [employer].

## 2019-02-14 ENCOUNTER — Ambulatory Visit (INDEPENDENT_AMBULATORY_CARE_PROVIDER_SITE_OTHER): Payer: PPO | Admitting: Cardiology

## 2019-02-14 ENCOUNTER — Other Ambulatory Visit: Payer: Self-pay

## 2019-02-14 ENCOUNTER — Encounter: Payer: Self-pay | Admitting: Cardiology

## 2019-02-14 VITALS — BP 152/72 | HR 82 | Ht 63.0 in | Wt 143.8 lb

## 2019-02-14 DIAGNOSIS — I480 Paroxysmal atrial fibrillation: Secondary | ICD-10-CM

## 2019-02-14 DIAGNOSIS — I495 Sick sinus syndrome: Secondary | ICD-10-CM

## 2019-02-14 NOTE — Progress Notes (Signed)
Electrophysiology Office Note   Date:  02/14/2019   ID:  Virginia, Huber [birth year], MRN [id number]  PCP:  Sharilyn Sites, MD  Cardiologist:  Bronson Ing Primary Electrophysiologist:  Annesha Delgreco Meredith Leeds, MD    No chief complaint on file.    History of Present Illness: Virginia Huber is a 73 y.o. female who presents today for electrophysiology evaluation.   She has a history of CAD status post MI and PCI of the OM1, with an an STEMI 03/2015 status post DES to the circumflex, an STEMI 07/2015 with PCI to the RCA and paroxysmal atrial fibrillation during that admission. She is on Eliquis. Sleep study showed mild sleep apnea. He was in the hospital on 12/17 and was found to be in A. fib with RVR. She converted to sinus rhythm after arrival. He was readmitted to the hospital on 12/23 and was noted to be in atrial flutter with rapid rates at that time. She had junctional rhythm and near-syncope after conversion to sinus rhythm. She had a postconversion pause of 5.6 seconds. Pacemaker placed 06/25/16 with lead revision 06/26/16. Device implant was complicated by pericardial effusion and tamponade requiring emergent pericardiocentesis.  Today, denies symptoms of palpitations, chest pain, shortness of breath, orthopnea, PND, lower extremity edema, claudication, dizziness, presyncope, syncope, bleeding, or neurologic sequela. The patient is tolerating medications without difficulties.    Past Medical History:  Diagnosis Date   Arthritis    Atrial fibrillation and flutter ([employer])    a. h/o PAF/flutter during admission in 2013 for PNA. b. PAF during adm for NSTEMI 07/2015, subsequent paroxysms since then.   B12 deficiency anemia    Cardiac tamponade 06/2016   CHF (congestive heart failure) (HCC)    Coronary artery disease 11/30/2014   a. remote MI. b. h/o PTCA with scoring balloon to OM1 11/2014. c. NSTEMI 03/2015 s/p DES to prox-mid Cx. d. NSTEMI 07/2015 s/p scoring balloon/PTCA/DES to dRCA with  PAF during that admission   Cutaneous lupus erythematosus    GERD (gastroesophageal reflux disease)    Heart block    History of blood transfusion 1980's   2nd surgical procedures   HTN (hypertension)    Hypercholesteremia    Hypothyroidism    Myocardial infarction ([place]) 02/2012   Ovarian tumor    PAD (peripheral artery disease) ([place])    a. s/p LE angio 2015; followed by Dr. Fletcher Anon - managed medically.   Pain with urination 05/08/2015   Paroxysmal atrial flutter (HCC)    Pericardial effusion    a. 06/2016 after ppm - s/p pericardiocentesis.   Superficial fungus infection of skin 06/29/2013   Tachy-brady syndrome ([place])    a. s/p Medtronic PPM 06/2016, c/b lead perf/pericardial effusion.   TIA (transient ischemic attack) 08/2001; ~ 2006   Type II diabetes mellitus ([place])    UTI (urinary tract infection) 05/08/2013   Past Surgical History:  Procedure Laterality Date   ABDOMINAL AORTAGRAM N/A 01/03/2014   Procedure: ABDOMINAL [name];  Surgeon: Wellington Hampshire, MD;  Location: [facility] CATH LAB;  Service: Cardiovascular;  Laterality: N/A;   ABDOMINAL HYSTERECTOMY  1972   "partial"   APPENDECTOMY  1970's   CARDIAC CATHETERIZATION  2008   Tiny OM-2 with 90% narrowing. Med tx.   CARDIAC CATHETERIZATION N/A 11/30/2014   Procedure: Left Heart Cath and Coronary Angiography;  Surgeon: Troy Sine, MD; LAD 20%, CFX 50%, OM1 95%, right PLB 30%, LV normal    CARDIAC CATHETERIZATION N/A 11/30/2014   Procedure: Coronary Balloon Angioplasty;  Surgeon: Troy Sine, MD;  Angiosculpt scoring balloon and PTCA to the OM1 reducing stenosis from 95% to less than 10%   CARDIAC CATHETERIZATION N/A 04/03/2015   Procedure: Left Heart Cath and Coronary Angiography;  Surgeon: Jolaine Artist, MD; dLAD 50%, CFX 90%, OM1 100%, PLA 15%, LVEDP 13     CARDIAC CATHETERIZATION N/A 04/03/2015   Procedure: Coronary Stent Intervention;  Surgeon: Sherren Mocha, MD; 3.0x18 mm Xience DES to the CFX      CARDIAC CATHETERIZATION N/A 08/02/2015   Procedure: Left Heart Cath and Coronary Angiography;  Surgeon: Troy Sine, MD;  Location: [facility] CV LAB;  Service: Cardiovascular;  Laterality: N/A;   CARDIAC CATHETERIZATION N/A 08/02/2015   Procedure: Coronary Stent Intervention;  Surgeon: Troy Sine, MD;  Location: [facility] CV LAB;  Service: Cardiovascular;  Laterality: N/A;   CARDIAC CATHETERIZATION N/A 06/25/2016   Procedure: Pericardiocentesis;  Surgeon: Shayley Medlin Meredith Leeds, MD;  Location: [facility] CV LAB;  Service: Cardiovascular;  Laterality: N/A;   cardiac stents     CARDIOVERSION N/A 12/15/2017   Procedure: CARDIOVERSION;  Surgeon: Acie Fredrickson Wonda Cheng, MD;  Location: [facility] ENDOSCOPY;  Service: Cardiovascular;  Laterality: N/A;   CHOLECYSTECTOMY OPEN  1990's   COLONOSCOPY  2005   Dr. Laural Golden: pancolonic divericula, polyp, path unknown currently   COLONOSCOPY  2012   Dr. Oneida Alar: Normal TI, scattered diverticula in entire colon, small internal hemorrhoids, normal colon biopsies. Colonoscopy in 5-10 years.    COLOSTOMY  05/1979   COLOSTOMY REVERSAL  11/1979   EP IMPLANTABLE DEVICE N/A 06/25/2016   Procedure: Lead Revision/Repair;  Surgeon: Stelios Kirby Meredith Leeds, MD;  Location: [facility] CV LAB;  Service: Cardiovascular;  Laterality: N/A;   EP IMPLANTABLE DEVICE N/A 06/25/2016   Procedure: Pacemaker Implant;  Surgeon: Sylena Lotter Meredith Leeds, MD;  Location: [facility] CV LAB;  Service: Cardiovascular;  Laterality: N/A;   EXCISIONAL HEMORRHOIDECTOMY  1970's   EYE SURGERY Left 2000   "branch vein occlusion"   EYE SURGERY Left ~ 2001   "smoothed out wrinkle"   LEFT OOPHORECTOMY  05/1979   nicked bowel, peritonitis, colostomy; colostomy reversed 1981    LOWER EXTREMITY ANGIOGRAM N/A 01/03/2014   Procedure: LOWER EXTREMITY ANGIOGRAM;  Surgeon: Wellington Hampshire, MD;  Location: [facility] CATH LAB;  Service: Cardiovascular;  Laterality: N/A;   Nuclear med stress test  10/2011   Small  area of mild ischemia inferoapically.   PARTIAL HYSTERECTOMY  1970's   left ovaries, then ovaries removed later due tumors    RIGHT OOPHORECTOMY  1970's     Current Outpatient Medications  Medication Sig Dispense Refill   acetaminophen (TYLENOL) 325 MG tablet Take 650 mg by mouth every 6 (six) hours as needed for headache.     amiodarone (PACERONE) 200 MG tablet Take 0.5 tablets (100 mg total) by mouth daily. 45 tablet 1   apixaban (ELIQUIS) 5 MG TABS tablet Take 1 tablet (5 mg total) by mouth 2 (two) times daily. 28 tablet 0   atorvastatin (LIPITOR) 80 MG tablet TAKE ONE TABLET BY MOUTH EVERY DAY AT 6:00PM 90 tablet 3   carvedilol (COREG) 6.25 MG tablet TAKE (6.25MG  TOTAL) BY MOUTH TWO TIMES DAILY 180 tablet 3   Cholecalciferol (VITAMIN D) 2000 units tablet Take 2,000 Units by mouth daily.      clopidogrel (PLAVIX) 75 MG tablet Take 1 tablet (75 mg total) by mouth daily. 90 tablet 1   diltiazem (CARDIZEM CD) 360 MG 24 hr capsule Take 1 capsule (  360 mg total) by mouth daily. 90 capsule 3   diphenhydramine-acetaminophen (TYLENOL PM EXTRA STRENGTH) 25-500 MG TABS tablet Take 1 tablet by mouth at bedtime as needed (Take as directed).      furosemide (LASIX) 40 MG tablet TAKE ONE TABLET (40MG  TOTAL) BY MOUTH TWO TIMES DAILY 180 tablet 4   glucose blood (FREESTYLE LITE) test strip USE AS DIRECTED TWICE DAILY. 100 each 5   insulin NPH-regular Human (NOVOLIN 70/30) (70-30) 100 UNIT/ML injection Inject 40 Units into the skin See admin instructions. Inject 25 units before breakfast and 25 units before supper  - only if pre-meal blood glucose is above 90 mg/dL. 10 mL 11   levothyroxine (SYNTHROID) 137 MCG tablet Take 137 mcg by mouth daily before breakfast.     losartan (COZAAR) 100 MG tablet TAKE ONE (1) TABLET BY MOUTH EVERY DAY 45 tablet 1   metFORMIN (GLUCOPHAGE) 500 MG tablet TAKE ONE TABLET BY MOUTH TWICE A DAY WITH A MEAL 180 tablet 0   nitroGLYCERIN (NITROSTAT) 0.4 MG SL  tablet Place 1 tablet (0.4 mg total) under the tongue every 5 (five) minutes as needed for chest pain. 25 tablet 0   potassium chloride SA (K-DUR) 20 MEQ tablet TAKE ONE (1) TABLET BY MOUTH EVERY DAY 90 tablet 1   rOPINIRole (REQUIP) 0.5 MG tablet Take 0.5 mg by mouth at bedtime.   0   traZODone (DESYREL) 50 MG tablet Take 0.5-1 tablets (25-50 mg total) by mouth at bedtime as needed for sleep. 90 tablet 1   No current facility-administered medications for this visit.     Allergies:   Penicillins and Percocet [oxycodone-acetaminophen]   Social History:  The patient  reports that she has never smoked. She has never used smokeless tobacco. She reports that she does not drink alcohol or use drugs.   Family History:  The patient's family history includes Diabetes in her brother; Heart disease in her brother, brother, father, mother, and sister; Lupus in her daughter; Thyroid disease in her brother and brother.   ROS:  Please see the history of present illness.   Otherwise, review of systems is positive for none.   All other systems are reviewed and negative.   PHYSICAL EXAM: VS:  BP (!) 152/72    Pulse 82    Ht 5\' 3"  (1.6 m)    Wt 143 lb 12.8 oz (65.2 kg)    SpO2 95%    BMI 25.47 kg/m  , BMI Body mass index is 25.47 kg/m. GEN: Well nourished, well developed, in no acute distress  HEENT: normal  Neck: no JVD, carotid bruits, or masses Cardiac: RRR; no murmurs, rubs, or gallops,no edema  Respiratory:  clear to auscultation bilaterally, normal work of breathing GI: soft, nontender, nondistended, + BS MS: no deformity or atrophy  Skin: warm and dry, device site well healed Neuro:  Strength and sensation are intact Psych: euthymic mood, full affect  EKG:  EKG is ordered today. Personal review of the ekg ordered shows atrial paced, LVH with repolarization abnormality  Personal review of the device interrogation today. Results in [place]: 08/24/2018: B Natriuretic Peptide  149.0; BUN 25; Creatinine, Ser 1.66; Hemoglobin 12.5; Platelets 241; Potassium 3.5; Sodium 139    Lipid Panel     Component Value Date/Time   CHOL 166 08/05/2017 0807   TRIG 160 (H) 08/05/2017 0807   HDL 43 (L) 08/05/2017 0807   CHOLHDL 3.9 08/05/2017 0807   VLDL NOT CALC 04/14/2016 0910  Center 96 08/05/2017 0807     Wt Readings from Last 3 Encounters:  02/14/19 143 lb 12.8 oz (65.2 kg)  02/08/19 147 lb (66.7 kg)  01/17/19 145 lb (65.8 kg)      Other studies Reviewed: Additional studies/ records that were reviewed today include: TTE 08/04/17 Review of the above records today demonstrates:  - Left ventricle: The cavity size was normal. Wall thickness was   increased in a pattern of moderate LVH. Systolic function was   normal. The estimated ejection fraction was in the range of 55%   to 60%. Wall motion was normal; there were no regional wall   motion abnormalities. Features are consistent with a pseudonormal   left ventricular filling pattern, with concomitant abnormal   relaxation and increased filling pressure (grade 2 diastolic   dysfunction). Doppler parameters are consistent with high   ventricular filling pressure. - Left atrium: The atrium was severely dilated. - Right ventricle: Pacer wire or catheter noted in right ventricle. - Right atrium: Pacer wire or catheter noted in right atrium.  Cath  08/02/15  1st RPLB lesion, 50% stenosed.  Dist RCA lesion, 30% stenosed.  Ost 1st Mrg to 1st Mrg lesion, 100% stenosed.  Ost LAD lesion, 40% stenosed.  Mid LAD lesion, 40% stenosed.  Post Atrio lesion, 90% stenosed. Post intervention, there is a 0% residual stenosis.  The left ventricular systolic function is normal.  Mid RCA lesion, 20% stenosed.   Normal LV function without focal segmental wall motion abnormalities and ejection fraction of 55%.  Successful percutaneous cardiac intervention to the distal RCA treated with Angiosculpt scoring balloon, PTCA,  and ultimate stenting with a 2.58 mm Xience Alpine DES stent postdilated to 2.51 mm with a 90% stenosis reduced to 0% and no change in the ostial PDA narrowing.  ASSESSMENT AND PLAN:  1.  Paroxysmal atrial fibrillation with tachybradycardia syndrome: Status post Medtronic dual-chamber pacemaker.  Functioning appropriately.  Currently on amiodarone.  Check LFTs today.  Her thyroid is being followed by her primary physician.  This patients CHA2DS2-VASc Score and unadjusted Ischemic Stroke Rate (% per year) is equal to 11.2 % stroke rate/year from a score of 7  Above score calculated as 1 point each if present [CHF, HTN, DM, Vascular=MI/PAD/Aortic Plaque, Age if 65-74, or Female] Above score calculated as 2 points each if present [Age > 75, or Stroke/TIA/TE]   2. Coronary artery disease: No current chest pain  3. Hypertension: Elevated today.  She wears a heart rate monitor that gives her a blood pressure which has been normal at home.  We Virginia Huber have her check it on a formal blood pressure cuff when she is at home and she Virginia Huber either call us or her primary physician.  Current medicines are reviewed at length with the patient today.   The patient does not have concerns regarding her medicines.  The following changes were made today: None  Labs/ tests ordered today include:  Orders Placed This Encounter  Procedures   EKG 12-Lead     Disposition:   FU with Virginia Huber 6 months  Signed, Leila Schuff Meredith Leeds, MD  02/14/2019 3:23 PM     [place] [address] [place] [address] [phone] (office) [phone] (fax)

## 2019-02-14 NOTE — Patient Instructions (Addendum)
Medication Instructions:  Your physician recommends that you continue on your current medications as directed. Please refer to the Current Medication list given to you today.  *If you need a refill on your cardiac medications before your next appointment, please call your pharmacy*  Labwork: Get done at your primary doctor tomorrow (handwritten order given to you): LFTs  Testing/Procedures: None ordered  Follow-Up: Remote monitoring is used to monitor your Pacemaker or ICD from home. This monitoring reduces the number of office visits required to check your device to one time per year. It allows [place] to keep an eye on the functioning of your device to ensure it is working properly. You are scheduled for a device check from home on 04/06/19. You may send your transmission at any time that day. If you have a wireless device, the transmission will be sent automatically. After your physician reviews your transmission, you will receive a postcard with your next transmission date.  Your physician wants you to follow-up in: 6 months with Dr. Curt Bears.  You will receive a reminder letter in the mail two months in advance. If you don't receive a letter, please call our office to schedule the follow-up appointment.  Thank you for choosing [facility]!!   Trinidad Curet, RN [phone]

## 2019-02-15 DIAGNOSIS — Z6826 Body mass index (BMI) 26.0-26.9, adult: Secondary | ICD-10-CM | POA: Diagnosis not present

## 2019-02-15 DIAGNOSIS — E663 Overweight: Secondary | ICD-10-CM | POA: Diagnosis not present

## 2019-02-15 DIAGNOSIS — E039 Hypothyroidism, unspecified: Secondary | ICD-10-CM | POA: Diagnosis not present

## 2019-02-15 DIAGNOSIS — R030 Elevated blood-pressure reading, without diagnosis of hypertension: Secondary | ICD-10-CM | POA: Diagnosis not present

## 2019-02-20 NOTE — Telephone Encounter (Signed)
Trazodone was approved by cardiologist, will start taking trazodone for insomnia. CD

## 2019-03-02 DIAGNOSIS — Z23 Encounter for immunization: Secondary | ICD-10-CM | POA: Diagnosis not present

## 2019-03-09 ENCOUNTER — Telehealth: Payer: Self-pay | Admitting: *Deleted

## 2019-03-09 NOTE — Telephone Encounter (Signed)
Pt aware that I left 1 month of Eliquis samples at front desk for her to pick up at her convenience. Pt appreciative of our help.

## 2019-03-14 DIAGNOSIS — H43811 Vitreous degeneration, right eye: Secondary | ICD-10-CM | POA: Diagnosis not present

## 2019-03-14 DIAGNOSIS — H33301 Unspecified retinal break, right eye: Secondary | ICD-10-CM | POA: Diagnosis not present

## 2019-03-14 DIAGNOSIS — E113411 Type 2 diabetes mellitus with severe nonproliferative diabetic retinopathy with macular edema, right eye: Secondary | ICD-10-CM | POA: Diagnosis not present

## 2019-03-14 DIAGNOSIS — H34831 Tributary (branch) retinal vein occlusion, right eye, with macular edema: Secondary | ICD-10-CM | POA: Diagnosis not present

## 2019-03-18 ENCOUNTER — Other Ambulatory Visit: Payer: Self-pay | Admitting: Cardiovascular Disease

## 2019-04-06 ENCOUNTER — Encounter: Payer: PPO | Admitting: *Deleted

## 2019-04-11 ENCOUNTER — Ambulatory Visit (INDEPENDENT_AMBULATORY_CARE_PROVIDER_SITE_OTHER): Payer: PPO | Admitting: *Deleted

## 2019-04-11 DIAGNOSIS — I495 Sick sinus syndrome: Secondary | ICD-10-CM | POA: Diagnosis not present

## 2019-04-11 DIAGNOSIS — I480 Paroxysmal atrial fibrillation: Secondary | ICD-10-CM

## 2019-04-12 LAB — CUP PACEART REMOTE DEVICE CHECK
Battery Remaining Longevity: 88 mo
Battery Voltage: 3.01 V
Brady Statistic AP VP Percent: 0.07 %
Brady Statistic AP VS Percent: 93.5 %
Brady Statistic AS VP Percent: 0.03 %
Brady Statistic AS VS Percent: 6.4 %
Brady Statistic RA Percent Paced: 93.55 %
Brady Statistic RV Percent Paced: 0.1 %
Date Time Interrogation Session: 20201020140230
Implantable Lead Implant Date: 20180104
Implantable Lead Implant Date: 20180104
Implantable Lead Location: 753859
Implantable Lead Location: 753860
Implantable Lead Model: 5076
Implantable Lead Model: 5076
Implantable Pulse Generator Implant Date: 20180104
Lead Channel Impedance Value: 323 Ohm
Lead Channel Impedance Value: 361 Ohm
Lead Channel Impedance Value: 380 Ohm
Lead Channel Impedance Value: 475 Ohm
Lead Channel Pacing Threshold Amplitude: 0.625 V
Lead Channel Pacing Threshold Amplitude: 1.25 V
Lead Channel Pacing Threshold Pulse Width: 0.4 ms
Lead Channel Pacing Threshold Pulse Width: 0.4 ms
Lead Channel Sensing Intrinsic Amplitude: 1.125 mV
Lead Channel Sensing Intrinsic Amplitude: 1.125 mV
Lead Channel Sensing Intrinsic Amplitude: 13.75 mV
Lead Channel Sensing Intrinsic Amplitude: 13.75 mV
Lead Channel Setting Pacing Amplitude: 2 V
Lead Channel Setting Pacing Amplitude: 2.75 V
Lead Channel Setting Pacing Pulse Width: 0.4 ms
Lead Channel Setting Sensing Sensitivity: 2.8 mV

## 2019-04-14 DIAGNOSIS — Z6826 Body mass index (BMI) 26.0-26.9, adult: Secondary | ICD-10-CM | POA: Diagnosis not present

## 2019-04-14 DIAGNOSIS — E663 Overweight: Secondary | ICD-10-CM | POA: Diagnosis not present

## 2019-04-14 DIAGNOSIS — E7849 Other hyperlipidemia: Secondary | ICD-10-CM | POA: Diagnosis not present

## 2019-04-14 DIAGNOSIS — I10 Essential (primary) hypertension: Secondary | ICD-10-CM | POA: Diagnosis not present

## 2019-04-14 DIAGNOSIS — E119 Type 2 diabetes mellitus without complications: Secondary | ICD-10-CM | POA: Diagnosis not present

## 2019-04-20 DIAGNOSIS — E119 Type 2 diabetes mellitus without complications: Secondary | ICD-10-CM | POA: Diagnosis not present

## 2019-04-20 DIAGNOSIS — E113411 Type 2 diabetes mellitus with severe nonproliferative diabetic retinopathy with macular edema, right eye: Secondary | ICD-10-CM | POA: Diagnosis not present

## 2019-04-20 DIAGNOSIS — H34831 Tributary (branch) retinal vein occlusion, right eye, with macular edema: Secondary | ICD-10-CM | POA: Diagnosis not present

## 2019-04-20 DIAGNOSIS — H348322 Tributary (branch) retinal vein occlusion, left eye, stable: Secondary | ICD-10-CM | POA: Diagnosis not present

## 2019-04-20 DIAGNOSIS — H43811 Vitreous degeneration, right eye: Secondary | ICD-10-CM | POA: Diagnosis not present

## 2019-04-26 LAB — CUP PACEART INCLINIC DEVICE CHECK
Battery Remaining Longevity: 89 mo
Battery Voltage: 3.01 V
Brady Statistic AP VP Percent: 0.06 %
Brady Statistic AP VS Percent: 89.62 %
Brady Statistic AS VP Percent: 0.01 %
Brady Statistic AS VS Percent: 10.3 %
Brady Statistic RA Percent Paced: 89.67 %
Brady Statistic RV Percent Paced: 0.08 %
Date Time Interrogation Session: 20200825185039
Implantable Lead Implant Date: 20180104
Implantable Lead Implant Date: 20180104
Implantable Lead Location: 753859
Implantable Lead Location: 753860
Implantable Lead Model: 5076
Implantable Lead Model: 5076
Implantable Pulse Generator Implant Date: 20180104
Lead Channel Impedance Value: 323 Ohm
Lead Channel Impedance Value: 380 Ohm
Lead Channel Impedance Value: 380 Ohm
Lead Channel Impedance Value: 494 Ohm
Lead Channel Pacing Threshold Amplitude: 0.75 V
Lead Channel Pacing Threshold Amplitude: 1.25 V
Lead Channel Pacing Threshold Pulse Width: 0.4 ms
Lead Channel Pacing Threshold Pulse Width: 0.4 ms
Lead Channel Sensing Intrinsic Amplitude: 1.375 mV
Lead Channel Sensing Intrinsic Amplitude: 15.625 mV
Lead Channel Setting Pacing Amplitude: 2 V
Lead Channel Setting Pacing Amplitude: 2.5 V
Lead Channel Setting Pacing Pulse Width: 0.4 ms
Lead Channel Setting Sensing Sensitivity: 2.8 mV

## 2019-04-27 NOTE — Progress Notes (Signed)
Remote pacemaker transmission.   

## 2019-05-10 ENCOUNTER — Other Ambulatory Visit (HOSPITAL_COMMUNITY): Payer: Self-pay | Admitting: Obstetrics and Gynecology

## 2019-05-10 DIAGNOSIS — Z1231 Encounter for screening mammogram for malignant neoplasm of breast: Secondary | ICD-10-CM

## 2019-05-22 ENCOUNTER — Other Ambulatory Visit: Payer: Self-pay | Admitting: Cardiovascular Disease

## 2019-05-25 DIAGNOSIS — E113411 Type 2 diabetes mellitus with severe nonproliferative diabetic retinopathy with macular edema, right eye: Secondary | ICD-10-CM | POA: Diagnosis not present

## 2019-05-25 DIAGNOSIS — H33301 Unspecified retinal break, right eye: Secondary | ICD-10-CM | POA: Diagnosis not present

## 2019-05-25 DIAGNOSIS — H34831 Tributary (branch) retinal vein occlusion, right eye, with macular edema: Secondary | ICD-10-CM | POA: Diagnosis not present

## 2019-06-26 ENCOUNTER — Ambulatory Visit (HOSPITAL_COMMUNITY)
Admission: RE | Admit: 2019-06-26 | Discharge: 2019-06-26 | Disposition: A | Discharge status: Home / Self Care | Payer: PPO | Source: Ambulatory Visit | Attending: Obstetrics and Gynecology | Admitting: Obstetrics and Gynecology

## 2019-06-26 ENCOUNTER — Other Ambulatory Visit: Payer: Self-pay

## 2019-06-26 DIAGNOSIS — Z1231 Encounter for screening mammogram for malignant neoplasm of breast: Secondary | ICD-10-CM | POA: Diagnosis not present

## 2019-06-26 NOTE — Progress Notes (Signed)
Normal mammogram

## 2019-06-27 ENCOUNTER — Encounter: Payer: Self-pay | Admitting: Sports Medicine

## 2019-06-27 ENCOUNTER — Other Ambulatory Visit: Payer: Self-pay

## 2019-06-27 ENCOUNTER — Ambulatory Visit: Payer: PPO | Admitting: Sports Medicine

## 2019-06-27 DIAGNOSIS — B351 Tinea unguium: Secondary | ICD-10-CM | POA: Diagnosis not present

## 2019-06-27 DIAGNOSIS — M79675 Pain in left toe(s): Secondary | ICD-10-CM | POA: Diagnosis not present

## 2019-06-27 DIAGNOSIS — M21619 Bunion of unspecified foot: Secondary | ICD-10-CM | POA: Diagnosis not present

## 2019-06-27 DIAGNOSIS — M79674 Pain in right toe(s): Secondary | ICD-10-CM | POA: Diagnosis not present

## 2019-06-27 DIAGNOSIS — E119 Type 2 diabetes mellitus without complications: Secondary | ICD-10-CM | POA: Diagnosis not present

## 2019-06-27 DIAGNOSIS — M2040 Other hammer toe(s) (acquired), unspecified foot: Secondary | ICD-10-CM | POA: Diagnosis not present

## 2019-06-27 DIAGNOSIS — Z7901 Long term (current) use of anticoagulants: Secondary | ICD-10-CM

## 2019-06-27 NOTE — Progress Notes (Signed)
Subjective: Virginia Huber is a 74 y.o. female patient with history of diabetes who presents to office today complaining of long,mildly painful nails while ambulating in shoes; unable to trim and is unsure if her nail is ingrown at right 1st toe lateral aspect or if she is getting pain due to her toes crossing each other. Patient states that the glucose reading this morning was not recorded. Patient denies any new changes in medication or new problems. Patient denies any new cramping, numbness, burning or tingling in the legs.Admits to a significant history of heart problems on plavix and eliqus.  Review of Systems  All other systems reviewed and are negative.    Patient Active Problem List   Diagnosis Date Noted  . Acute on chronic diastolic HF (heart failure) ([place]) 12/17/2017  . GERD (gastroesophageal reflux disease) 12/17/2017  . Typical atrial flutter ([place])   . 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 Paroxysmal atrial fibrillation ([place]) 12/08/2017  . 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 CKD (chronic kidney disease) stage 3, GFR 30-59 ml/min 09/23/2017  . Fever 09/23/2017  . S/P pericardiocentesis 06/28/2016  . Acute blood loss anemia 06/28/2016  . Pericardial effusion 06/26/2016  . Tachy-brady syndrome ([place]) 06/25/2016  . Tamponade   . Bradycardia 06/14/2016  . Junctional bradycardia   . Coronary artery disease involving coronary bypass graft of native heart with unstable angina pectoris ([place])   . Acute diastolic CHF (congestive heart failure), NYHA class 3 ([place])   . Systolic congestive heart failure ([place]) 05/13/2016  . Multiple and bilateral precerebral artery syndromes 05/13/2016  . OSA (obstructive sleep apnea) 05/13/2016  . Chronic diarrhea 12/25/2015  . Chest pain 08/02/2015  . Atrial fibrillation with rapid ventricular response ([place])   . Pain with urination 05/08/2015  . NSTEMI (non-ST elevated myocardial infarction) ([place])  04/02/2015  . CAD in native artery 11/30/2014  . 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   . PAD (peripheral artery disease) ([place]) 12/26/2013  . Superficial fungus infection of skin 06/29/2013  . UTI (urinary tract infection) 05/08/2013  . Hypokalemia 03/05/2012  . B12 deficiency anemia 03/02/2012  . Bronchospasm 03/02/2012  . Community acquired bacterial pneumonia 03/01/2012  . Acute respiratory failure with hypoxia ([place]) 03/01/2012  . DM type 2 causing vascular disease ([place]) 02/29/2012  . Hypothyroidism 02/28/2012  . RLQ abdominal pain 11/24/2010  . OVERWEIGHT/OBESITY 06/03/2010  . Essential hypertension 06/03/2010  . Mixed hyperlipidemia 12/27/2009  . Palpitations 05/17/2009  . FRACTURE, TOE 12/06/2007   Current Outpatient Medications on File Prior to Visit  Medication Sig Dispense Refill  . acetaminophen (TYLENOL) 325 MG tablet Take 650 mg by mouth every 6 (six) hours as needed for headache.    [name] amiodarone (PACERONE) 200 MG tablet Take 0.5 tablets (100 mg total) by mouth daily. 45 tablet 1  . apixaban (ELIQUIS) 5 MG TABS tablet Take 1 tablet (5 mg total) by mouth 2 (two) times daily. 28 tablet 0  . atorvastatin (LIPITOR) 80 MG tablet TAKE ONE TABLET BY MOUTH EVERY DAY AT 6:00PM 90 tablet 3  . carvedilol (COREG) 6.25 MG tablet TAKE (6.25MG  TOTAL) BY MOUTH TWO TIMES DAILY 180 tablet 3  . Cholecalciferol (VITAMIN D) 2000 units tablet Take 2,000 Units by mouth daily.     . clopidogrel (PLAVIX) 75 MG tablet TAKE ONE TABLET (75MG  TOTAL) BY MOUTH DAILY 90 tablet 3  . diltiazem (CARDIZEM CD) 360 MG 24 hr capsule Take  1 capsule (360 mg total) by mouth daily. 90 capsule 3  . diphenhydramine-acetaminophen (TYLENOL PM EXTRA STRENGTH) 25-500 MG TABS tablet Take 1 tablet by mouth at bedtime as needed (Take as directed).     . furosemide (LASIX) 40 MG tablet TAKE ONE TABLET (40MG  TOTAL) BY MOUTH TWO TIMES DAILY 180 tablet 4  . glucose blood  (FREESTYLE LITE) test strip USE AS DIRECTED TWICE DAILY. 100 each 5  . insulin NPH-regular Human (NOVOLIN 70/30) (70-30) 100 UNIT/ML injection Inject 40 Units into the skin See admin instructions. Inject 25 units before breakfast and 25 units before supper  - only if pre-meal blood glucose is above 90 mg/dL. 10 mL 11  . levothyroxine (SYNTHROID) 137 MCG tablet Take 137 mcg by mouth daily before breakfast.    . losartan (COZAAR) 100 MG tablet TAKE ONE (1) TABLET BY MOUTH EVERY DAY 45 tablet 1  . metFORMIN (GLUCOPHAGE) 500 MG tablet TAKE ONE TABLET BY MOUTH TWICE A DAY WITH A MEAL 180 tablet 0  . nitroGLYCERIN (NITROSTAT) 0.4 MG SL tablet Place 1 tablet (0.4 mg total) under the tongue every 5 (five) minutes as needed for chest pain. 25 tablet 0  . potassium chloride SA (KLOR-CON) 20 MEQ tablet TAKE ONE (1) TABLET BY MOUTH EVERY DAY 90 tablet 1  . rOPINIRole (REQUIP) 0.5 MG tablet Take 0.5 mg by mouth at bedtime.   0  . traZODone (DESYREL) 50 MG tablet Take 0.5-1 tablets (25-50 mg total) by mouth at bedtime as needed for sleep. 90 tablet 1   No current facility-administered medications on file prior to visit.   Allergies  Allergen Reactions  . Penicillins Hives    Has patient had a PCN reaction causing immediate rash, facial/tongue/throat swelling, SOB or lightheadedness with hypotension: Yes Has patient had a PCN reaction causing severe rash involving mucus membranes or skin necrosis: No Has patient had a PCN reaction that required hospitalization No Has patient had a PCN reaction occurring within the last 10 years: No If all of the above answers are "NO", then may proceed with Cephalosporin use.  [name] Percocet [Oxycodone-Acetaminophen] Nausea And Vomiting    Recent Results (from the past 2160 hour(s))  CUP PACEART REMOTE DEVICE CHECK     Status: None   Collection Time: 04/11/19  2:02 PM  Result Value Ref Range   Date Time Interrogation Session [id number]    Pulse Generator Manufacturer  MERM    Pulse Gen Model [id number] Advisa DR MRI    Pulse Gen Serial Number [id number] [facility]plate Clinic Name [facility]    Implantable Pulse Generator Type Implantable Pulse Generator    Implantable Pulse Generator Implant Date [id number]    Implantable Lead Manufacturer MERM    Implantable Lead Model 5076 CapSureFix Novus MRI SureScan    Implantable Lead Serial Number [id number]    Implantable Lead Implant Date [id number]    Implantable Lead Location Detail 1 APPENDAGE    Implantable Lead Location [id number]    Implantable Lead Manufacturer MERM    Implantable Lead Model 5076 CapSureFix Novus MRI SureScan    Implantable Lead Serial Number [id number]    Implantable Lead Implant Date [id number]    Implantable Lead Location Detail 1 APEX    Implantable Lead Location [id number]    Lead Channel Setting Sensing Sensitivity 2.8 mV   Lead Channel Setting Pacing Amplitude 2 V   Lead Channel Setting Pacing Pulse Width 0.4 ms   Lead Channel Setting Pacing Amplitude 2.75 V   Lead  Channel Impedance Value 475 ohm   Lead Channel Impedance Value 361 ohm   Lead Channel Sensing Intrinsic Amplitude 1.125 mV   Lead Channel Sensing Intrinsic Amplitude 1.125 mV   Lead Channel Pacing Threshold Amplitude 0.625 V   Lead Channel Pacing Threshold Pulse Width 0.4 ms   Lead Channel Impedance Value 380 ohm   Lead Channel Impedance Value 323 ohm   Lead Channel Sensing Intrinsic Amplitude 13.75 mV   Lead Channel Sensing Intrinsic Amplitude 13.75 mV   Lead Channel Pacing Threshold Amplitude 1.25 V   Lead Channel Pacing Threshold Pulse Width 0.4 ms   Battery Status OK    Battery Remaining Longevity 88 mo   Battery Voltage 3.01 V   Brady Statistic RA Percent Paced 93.55 %   Brady Statistic RV Percent Paced 0.1 %   Brady Statistic AP VP Percent 0.07 %   Brady Statistic AS VP Percent 0.03 %   Brady Statistic AP VS Percent 93.5 %   Brady Statistic AS VS Percent 6.4 %    Objective: General: Patient is awake, alert, and oriented  x 3 and in no acute distress.  Integument: Skin is warm, dry and supple bilateral. Nails are tender, long, thickened and dystrophic with subungual debris, consistent with onychomycosis, 1-5 bilateral. Mild curvature of bilateral hallux nails, No signs of infection. No open lesions or preulcerative lesions present bilateral. Remaining integument unremarkable.  Vasculature:  Dorsalis Pedis pulse 1/4 bilateral. Posterior Tibial pulse  1/4 bilateral.  Capillary fill time <3 sec 1-5 bilateral. Positive hair growth to the level of the digits. Temperature gradient within normal limits. No varicosities present bilateral. No edema present bilateral.   Neurology: The patient has intact sensation measured with a 5.07/10g Semmes Weinstein Monofilament at all pedal sites bilateral . Vibratory sensation diminished bilateral with tuning fork. No Babinski sign present bilateral.   Musculoskeletal: Asymptomatic bunion and hammertoe pedal deformities noted bilateral. Muscular strength 5/5 in all lower extremity muscular groups bilateral without pain on range of motion . No tenderness with calf compression bilateral.  Assessment and Plan: Problem List Items Addressed This Visit    None    Visit Diagnoses    Pain due to onychomycosis of toenails of both feet    -  Primary   Bunion       Hammer toe, unspecified laterality       Diabetes mellitus without complication ([place])       Anticoagulated         -Examined patient. -Discussed and educated patient on diabetic foot care, especially with  regards to the vascular, neurological and musculoskeletal systems.  -Stressed the importance of good glycemic control and the detriment of not  controlling glucose levels in relation to the foot. -Mechanically debrided all nails 1-5 bilateral using sterile nail nipper and filed with dremel without incident  -Dispensed toe spacer and advised patient that if this works will not need any nail procedure; Patient is not a good  candidate due to medical history -Answered all patient questions -Patient to return  in 3 months for at risk foot care -Patient advised to call the office if any problems or questions arise in the meantime.  Landis Martins, DPM

## 2019-06-29 ENCOUNTER — Telehealth: Payer: Self-pay | Admitting: Cardiovascular Disease

## 2019-06-29 NOTE — Telephone Encounter (Signed)
Pt got up this morning and was in Afib, thinks she's back in rhythm now but her BP was 179/92  Please call pt [phone]

## 2019-06-29 NOTE — Telephone Encounter (Signed)
I spoke to the patient who woke up this morning and noticed that she was in Afib with a HR of 122, BP 179/92, with some discomfort in her chest.  She now feels fine and HR is 67.  She said the episode does not last very long.  This is the first time it has happened in a long time and will continue to monitor.

## 2019-07-04 DIAGNOSIS — H33301 Unspecified retinal break, right eye: Secondary | ICD-10-CM | POA: Diagnosis not present

## 2019-07-04 DIAGNOSIS — H43811 Vitreous degeneration, right eye: Secondary | ICD-10-CM | POA: Diagnosis not present

## 2019-07-04 DIAGNOSIS — E113411 Type 2 diabetes mellitus with severe nonproliferative diabetic retinopathy with macular edema, right eye: Secondary | ICD-10-CM | POA: Diagnosis not present

## 2019-07-04 DIAGNOSIS — H34831 Tributary (branch) retinal vein occlusion, right eye, with macular edema: Secondary | ICD-10-CM | POA: Diagnosis not present

## 2019-07-05 ENCOUNTER — Ambulatory Visit
Admission: EM | Admit: 2019-07-05 | Discharge: 2019-07-05 | Disposition: A | Discharge status: Home / Self Care | Payer: PPO | Attending: Emergency Medicine | Admitting: Emergency Medicine

## 2019-07-05 ENCOUNTER — Other Ambulatory Visit: Payer: Self-pay

## 2019-07-05 DIAGNOSIS — R109 Unspecified abdominal pain: Secondary | ICD-10-CM | POA: Insufficient documentation

## 2019-07-05 DIAGNOSIS — N3001 Acute cystitis with hematuria: Secondary | ICD-10-CM | POA: Diagnosis not present

## 2019-07-05 LAB — POCT URINALYSIS DIP (MANUAL ENTRY)
Bilirubin, UA: NEGATIVE
Glucose, UA: NEGATIVE mg/dL
Ketones, POC UA: NEGATIVE mg/dL
Nitrite, UA: NEGATIVE
Protein Ur, POC: 100 mg/dL — AB
Spec Grav, UA: 1.015 (ref 1.010–1.025)
Urobilinogen, UA: 0.2 E.U./dL
pH, UA: 5.5 (ref 5.0–8.0)

## 2019-07-05 MED ORDER — SULFAMETHOXAZOLE-TRIMETHOPRIM 800-160 MG PO TABS: 1.0000 | ORAL_TABLET | Freq: Two times a day (BID) | ORAL | 0 refills | Status: AC

## 2019-07-05 NOTE — ED Provider Notes (Signed)
[facility]   CC: RT flank pain  SUBJECTIVE:  Virginia Huber is a 74 y.o. female who complains of right flank pain and increased urinary frequency x 10 days.  Patient denies a trauma, precipitating event, recent sexual encounter, excessive caffeine intake, or heavy lifting.  Localizes the pain to the RT flank.  Pain is constant and describes it as burning.  Has tried cold compress without relief.  Symptoms are made worse to the touch.  Denies to similar symptoms in the past.  Denies fever, chills, nausea, vomiting, abdominal pain, changes in bowel habits, melena, hematochezia.    Denies hx of shingles.  Reports hx of diverticulitis on RLQ, but denies flank pain associated with diverticulitis.    LMP: No LMP recorded. Patient has had a hysterectomy.  ROS: As in HPI.  All other pertinent ROS negative.     Past Medical History:  Diagnosis Date  . Arthritis   . Atrial fibrillation and flutter ([place])    a. h/o PAF/flutter during admission in 2013 for PNA. b. PAF during adm for NSTEMI 07/2015, subsequent paroxysms since then.  . B12 deficiency anemia   . Cardiac tamponade 06/2016  . CHF (congestive heart failure) ([place])   . Coronary artery disease 11/30/2014   a. remote MI. b. h/o PTCA with scoring balloon to OM1 11/2014. c. NSTEMI 03/2015 s/p DES to prox-mid Cx. d. NSTEMI 07/2015 s/p scoring balloon/PTCA/DES to dRCA with PAF during that admission  . Cutaneous lupus erythematosus   . GERD (gastroesophageal reflux disease)   . Heart block   . History of blood transfusion 1980's   2nd surgical procedures  . HTN (hypertension)   . Hypercholesteremia   . Hypothyroidism   . Myocardial infarction ([place]) 02/2012  . Ovarian tumor   . PAD (peripheral artery disease) ([place])    a. s/p LE angio 2015; followed by Dr. Fletcher Anon - managed medically.  . Pain with urination 05/08/2015  . Paroxysmal atrial flutter ([place])   . Pericardial effusion    a. 06/2016 after ppm - s/p pericardiocentesis.  .  Superficial fungus infection of skin 06/29/2013  . Tachy-brady syndrome ([place])    a. s/p Medtronic PPM 06/2016, c/b lead perf/pericardial effusion.  [name] TIA (transient ischemic attack) 08/2001; ~ 2006  . Type II diabetes mellitus ([place])   . UTI (urinary tract infection) 05/08/2013   Past Surgical History:  Procedure Laterality Date  . ABDOMINAL AORTAGRAM N/A 01/03/2014   Procedure: ABDOMINAL [name];  Surgeon: Wellington Hampshire, MD;  Location: [facility];  Service: Cardiovascular;  Laterality: N/A;  . ABDOMINAL HYSTERECTOMY  1972   "partial"  . APPENDECTOMY  1970's  . CARDIAC CATHETERIZATION  2008   Tiny OM-2 with 90% narrowing. Med tx.  [name] CARDIAC CATHETERIZATION N/A 11/30/2014   Procedure: Left Heart Cath and Coronary Angiography;  Surgeon: Troy Sine, MD; LAD 20%, CFX 50%, OM1 95%, right PLB 30%, LV normal   . CARDIAC CATHETERIZATION N/A 11/30/2014   Procedure: Coronary Balloon Angioplasty;  Surgeon: Troy Sine, MD;  Angiosculpt scoring balloon and PTCA to the OM1 reducing stenosis from 95% to less than 10%  . CARDIAC CATHETERIZATION N/A 04/03/2015   Procedure: Left Heart Cath and Coronary Angiography;  Surgeon: Jolaine Artist, MD; dLAD 50%, CFX 90%, OM1 100%, PLA 15%, LVEDP 13    . CARDIAC CATHETERIZATION N/A 04/03/2015   Procedure: Coronary Stent Intervention;  Surgeon: Sherren Mocha, MD; 3.0x18 mm Xience DES to the CFX    . CARDIAC  CATHETERIZATION N/A 08/02/2015   Procedure: Left Heart Cath and Coronary Angiography;  Surgeon: Troy Sine, MD;  Location: [facility];  Service: Cardiovascular;  Laterality: N/A;  . CARDIAC CATHETERIZATION N/A 08/02/2015   Procedure: Coronary Stent Intervention;  Surgeon: Troy Sine, MD;  Location: [facility];  Service: Cardiovascular;  Laterality: N/A;  . CARDIAC CATHETERIZATION N/A 06/25/2016   Procedure: Pericardiocentesis;  Surgeon: Will Meredith Leeds, MD;  Location: [facility];  Service: Cardiovascular;  Laterality:  N/A;  . cardiac stents    . CARDIOVERSION N/A 12/15/2017   Procedure: CARDIOVERSION;  Surgeon: Acie Fredrickson Wonda Cheng, MD;  Location: [facility] ENDOS[facility];  Service: Cardiovascular;  Laterality: N/A;  . CHOLECYSTECTOMY OPEN  1990's  . COLONOSCOPY  2005   Dr. Laural Golden: pancolonic divericula, polyp, path unknown currently  . COLONOSCOPY  2012   Dr. Oneida Alar: Normal TI, scattered diverticula in entire colon, small internal hemorrhoids, normal colon biopsies. Colonoscopy in 5-10 years.   . COLOSTOMY  05/1979  . COLOSTOMY REVERSAL  11/1979  . EP IMPLANTABLE DEVICE N/A 06/25/2016   Procedure: Lead Revision/Repair;  Surgeon: Will Meredith Leeds, MD;  Location: [facility];  Service: Cardiovascular;  Laterality: N/A;  . EP IMPLANTABLE DEVICE N/A 06/25/2016   Procedure: Pacemaker Implant;  Surgeon: Will Meredith Leeds, MD;  Location: [facility];  Service: Cardiovascular;  Laterality: N/A;  . EXCISIONAL HEMORRHOIDECTOMY  1970's  . EYE SURGERY Left 2000   "branch vein occlusion"  . EYE SURGERY Left ~ 2001   "smoothed out wrinkle"  . LEFT OOPHORECTOMY  05/1979   nicked bowel, peritonitis, colostomy; colostomy reversed 1981   . LOWER EXTREMITY ANGIOGRAM N/A 01/03/2014   Procedure: LOWER EXTREMITY ANGIOGRAM;  Surgeon: Wellington Hampshire, MD;  Location: [facility];  Service: Cardiovascular;  Laterality: N/A;  . Nuclear med stress test  10/2011   Small area of mild ischemia inferoapically.  [name] PARTIAL HYSTERECTOMY  1970's   left ovaries, then ovaries removed later due tumors   . RIGHT OOPHORECTOMY  1970's   Allergies  Allergen Reactions  . Penicillins Hives    Has patient had a PCN reaction causing immediate rash, facial/tongue/throat swelling, SOB or lightheadedness with hypotension: Yes Has patient had a PCN reaction causing severe rash involving mucus membranes or skin necrosis: No Has patient had a PCN reaction that required hospitalization No Has patient had a PCN reaction occurring within the last 10  years: No If all of the above answers are "NO", then may proceed with Cephalosporin use.  [name] Percocet [Oxycodone-Acetaminophen] Nausea And Vomiting   No current facility-administered medications on file prior to encounter.   Current Outpatient Medications on File Prior to Encounter  Medication Sig Dispense Refill  . acetaminophen (TYLENOL) 325 MG tablet Take 650 mg by mouth every 6 (six) hours as needed for headache.    [name] amiodarone (PACERONE) 200 MG tablet Take 0.5 tablets (100 mg total) by mouth daily. 45 tablet 1  . apixaban (ELIQUIS) 5 MG TABS tablet Take 1 tablet (5 mg total) by mouth 2 (two) times daily. 28 tablet 0  . atorvastatin (LIPITOR) 80 MG tablet TAKE ONE TABLET BY MOUTH EVERY DAY AT 6:00PM 90 tablet 3  . carvedilol (COREG) 6.25 MG tablet TAKE (6.25MG  TOTAL) BY MOUTH TWO TIMES DAILY 180 tablet 3  . Cholecalciferol (VITAMIN D) 2000 units tablet Take 2,000 Units by mouth daily.     . clopidogrel (PLAVIX) 75 MG tablet TAKE ONE TABLET (75MG  TOTAL)  BY MOUTH DAILY 90 tablet 3  . diltiazem (CARDIZEM CD) 360 MG 24 hr capsule Take 1 capsule (360 mg total) by mouth daily. 90 capsule 3  . diphenhydramine-acetaminophen (TYLENOL PM EXTRA STRENGTH) 25-500 MG TABS tablet Take 1 tablet by mouth at bedtime as needed (Take as directed).     . furosemide (LASIX) 40 MG tablet TAKE ONE TABLET (40MG  TOTAL) BY MOUTH TWO TIMES DAILY 180 tablet 4  . glucose blood (FREESTYLE LITE) test strip USE AS DIRECTED TWICE DAILY. 100 each 5  . insulin NPH-regular Human (NOVOLIN 70/30) (70-30) 100 UNIT/ML injection Inject 40 Units into the skin See admin instructions. Inject 25 units before breakfast and 25 units before supper  - only if pre-meal blood glucose is above 90 mg/dL. 10 mL 11  . levothyroxine (SYNTHROID) 137 MCG tablet Take 137 mcg by mouth daily before breakfast.    . losartan (COZAAR) 100 MG tablet TAKE ONE (1) TABLET BY MOUTH EVERY DAY 45 tablet 1  . metFORMIN (GLUCOPHAGE) 500 MG tablet TAKE ONE TABLET  BY MOUTH TWICE A DAY WITH A MEAL 180 tablet 0  . nitroGLYCERIN (NITROSTAT) 0.4 MG SL tablet Place 1 tablet (0.4 mg total) under the tongue every 5 (five) minutes as needed for chest pain. 25 tablet 0  . potassium chloride SA (KLOR-CON) 20 MEQ tablet TAKE ONE (1) TABLET BY MOUTH EVERY DAY 90 tablet 1  . rOPINIRole (REQUIP) 0.5 MG tablet Take 0.5 mg by mouth at bedtime.   0  . traZODone (DESYREL) 50 MG tablet Take 0.5-1 tablets (25-50 mg total) by mouth at bedtime as needed for sleep. 90 tablet 1   Social History   Socioeconomic History  . Marital status: Married    Spouse name: Not on file  . Number of children: Not on file  . Years of education: Not on file  . Highest education level: Not on file  Occupational History  . Occupation: Retired    Fish farm manager: RETIRED    Comment: Research officer, political party  Tobacco Use  . Smoking status: Never Smoker  . Smokeless tobacco: Never Used  . Tobacco comment: Never smoked  Substance and Sexual Activity  . Alcohol use: No    Alcohol/week: 0.0 standard drinks  . Drug use: No  . Sexual activity: Never    Birth control/protection: Surgical    Comment: hyst  Other Topics Concern  . Not on file  Social History Narrative  . Not on file   Social Determinants of Health   Financial Resource Strain:   . Difficulty of Paying Living Expenses: Not on file  Food Insecurity:   . Worried About Charity fundraiser in the Last Year: Not on file  . Ran Out of Food in the Last Year: Not on file  Transportation Needs:   . Lack of Transportation (Medical): Not on file  . Lack of Transportation (Non-Medical): Not on file  Physical Activity:   . Days of Exercise per Week: Not on file  . Minutes of Exercise per Session: Not on file  Stress:   . Feeling of Stress : Not on file  Social Connections:   . Frequency of Communication with Friends and Family: Not on file  . Frequency of Social Gatherings with Friends and Family: Not on file  . Attends Religious Services:  Not on file  . Active Member of Clubs or Organizations: Not on file  . Attends Archivist Meetings: Not on file  . Marital Status: Not on file  Intimate Partner Violence:   . Fear of Current or Ex-Partner: Not on file  . Emotionally Abused: Not on file  . Physically Abused: Not on file  . Sexually Abused: Not on file   Family History  Problem Relation Age of Onset  . Heart disease Mother        deceased  . Heart disease Father        deceased, heart disease  . Diabetes Brother   . Heart disease Brother   . Thyroid disease Brother   . Heart disease Sister   . Heart disease Brother   . Thyroid disease Brother   . Lupus Daughter   . Colon cancer Neg Hx     OBJECTIVE:  Vitals:   07/05/19 1339  BP: (!) 169/69  Pulse: 63  Resp: 18  Temp: 98.1 F (36.7 C)  SpO2: 98%   General appearance: Alert in no acute distress HEENT: NCAT.  Oropharynx clear.  Lungs: clear to auscultation bilaterally without adventitious breath sounds Heart: regular rate and rhythm.   Abdomen: soft; non-distended; no tenderness; bowel sounds present; no guarding Back: TTP over RT flank Extremities: no edema; symmetrical with no gross deformities Skin: warm and dry Neurologic: Ambulates from chair to exam table without difficulty Psychological: alert and cooperative; normal mood and affect  Labs Reviewed  POCT URINALYSIS DIP (MANUAL ENTRY) - Abnormal; Notable for the following components:      Result Value   Clarity, UA cloudy (*)    Blood, UA trace-lysed (*)    Protein Ur, POC =100 (*)    Leukocytes, UA Small (1+) (*)    All other components within normal limits  URINE CULTURE    ASSESSMENT & PLAN:  1. Right flank pain   2. Acute cystitis with hematuria     Meds ordered this encounter  Medications  . sulfamethoxazole-trimethoprim (BACTRIM DS) 800-160 MG tablet    Sig: Take 1 tablet by mouth 2 (two) times daily for 10 days.    Dispense:  20 tablet    Refill:  0    Order  Specific Question:   Supervising Provider    Answer:   Raylene Everts [[id number]]   Urine concerning for UTI.   Urine culture sent.  We will call you with abnormal results.   Push fluids and get plenty of rest.   Take antibiotic as directed and to completion Follow up with PCP next week for recheck Return here or go to ER if you have any new or worsening symptoms such as fever, abdominal pain, nausea/vomiting, worsening flank pain, etc...  Outlined signs and symptoms indicating need for more acute intervention. Patient verbalized understanding. After Visit Summary given.     Lestine Box, PA-C 07/05/19 1417

## 2019-07-05 NOTE — Discharge Instructions (Signed)
Urine concerning for UTI.   Urine culture sent.  We will call you with abnormal results.   Push fluids and get plenty of rest.   Take antibiotic as directed and to completion Follow up with PCP next week for recheck Return here or go to ER if you have any new or worsening symptoms such as fever, abdominal pain, nausea/vomiting, worsening flank pain, etc..[name]

## 2019-07-05 NOTE — ED Triage Notes (Signed)
Pt has c/o right side flank pain, pt describes pain that radiates from right flank down to right abdomen

## 2019-07-06 LAB — URINE CULTURE: Culture: >=100000 — AB

## 2019-07-07 ENCOUNTER — Telehealth (HOSPITAL_COMMUNITY): Payer: Self-pay | Admitting: Emergency Medicine

## 2019-07-07 NOTE — Telephone Encounter (Signed)
Urine culture grew 100,000 colonies, unable to find predominate. Contacted pt to inform her, she will follow up with PCP on Monday for a recheck.

## 2019-07-08 ENCOUNTER — Other Ambulatory Visit: Payer: Self-pay

## 2019-07-08 ENCOUNTER — Encounter (HOSPITAL_COMMUNITY): Payer: Self-pay | Admitting: Emergency Medicine

## 2019-07-08 ENCOUNTER — Emergency Department (HOSPITAL_COMMUNITY): Payer: PPO

## 2019-07-08 ENCOUNTER — Emergency Department (HOSPITAL_COMMUNITY)
Admission: EM | Admit: 2019-07-08 | Discharge: 2019-07-08 | Disposition: A | Discharge status: Home / Self Care | Payer: PPO | Attending: Emergency Medicine | Admitting: Emergency Medicine

## 2019-07-08 DIAGNOSIS — I480 Paroxysmal atrial fibrillation: Secondary | ICD-10-CM | POA: Insufficient documentation

## 2019-07-08 DIAGNOSIS — Z79899 Other long term (current) drug therapy: Secondary | ICD-10-CM | POA: Insufficient documentation

## 2019-07-08 DIAGNOSIS — Z950 Presence of cardiac pacemaker: Secondary | ICD-10-CM | POA: Insufficient documentation

## 2019-07-08 DIAGNOSIS — R1031 Right lower quadrant pain: Secondary | ICD-10-CM | POA: Diagnosis not present

## 2019-07-08 DIAGNOSIS — Z8673 Personal history of transient ischemic attack (TIA), and cerebral infarction without residual deficits: Secondary | ICD-10-CM | POA: Insufficient documentation

## 2019-07-08 DIAGNOSIS — I130 Hypertensive heart and chronic kidney disease with heart failure and stage 1 through stage 4 chronic kidney disease, or unspecified chronic kidney disease: Secondary | ICD-10-CM | POA: Diagnosis not present

## 2019-07-08 DIAGNOSIS — E039 Hypothyroidism, unspecified: Secondary | ICD-10-CM | POA: Diagnosis not present

## 2019-07-08 DIAGNOSIS — R109 Unspecified abdominal pain: Secondary | ICD-10-CM

## 2019-07-08 DIAGNOSIS — I259 Chronic ischemic heart disease, unspecified: Secondary | ICD-10-CM | POA: Diagnosis not present

## 2019-07-08 DIAGNOSIS — R7989 Other specified abnormal findings of blood chemistry: Secondary | ICD-10-CM

## 2019-07-08 DIAGNOSIS — E1122 Type 2 diabetes mellitus with diabetic chronic kidney disease: Secondary | ICD-10-CM | POA: Diagnosis not present

## 2019-07-08 DIAGNOSIS — Z794 Long term (current) use of insulin: Secondary | ICD-10-CM | POA: Insufficient documentation

## 2019-07-08 DIAGNOSIS — N183 Chronic kidney disease, stage 3 unspecified: Secondary | ICD-10-CM | POA: Insufficient documentation

## 2019-07-08 DIAGNOSIS — Z951 Presence of aortocoronary bypass graft: Secondary | ICD-10-CM | POA: Diagnosis not present

## 2019-07-08 DIAGNOSIS — I5033 Acute on chronic diastolic (congestive) heart failure: Secondary | ICD-10-CM | POA: Diagnosis not present

## 2019-07-08 DIAGNOSIS — Z7902 Long term (current) use of antithrombotics/antiplatelets: Secondary | ICD-10-CM | POA: Diagnosis not present

## 2019-07-08 DIAGNOSIS — N200 Calculus of kidney: Secondary | ICD-10-CM | POA: Diagnosis not present

## 2019-07-08 DIAGNOSIS — Z7901 Long term (current) use of anticoagulants: Secondary | ICD-10-CM | POA: Diagnosis not present

## 2019-07-08 DIAGNOSIS — R10A0 Flank pain, unspecified side: Secondary | ICD-10-CM

## 2019-07-08 LAB — BASIC METABOLIC PANEL
Anion gap: 9 (ref 5–15)
BUN: 25 mg/dL — ABNORMAL HIGH (ref 8–23)
CO2: 24 mmol/L (ref 22–32)
Calcium: 9.3 mg/dL (ref 8.9–10.3)
Chloride: 107 mmol/L (ref 98–111)
Creatinine, Ser: 1.71 mg/dL — ABNORMAL HIGH (ref 0.44–1.00)
GFR calc Af Amer: 34 mL/min — ABNORMAL LOW (ref >60)
GFR calc non Af Amer: 29 mL/min — ABNORMAL LOW (ref >60)
Glucose, Bld: 80 mg/dL (ref 70–99)
Potassium: 4 mmol/L (ref 3.5–5.1)
Sodium: 140 mmol/L (ref 135–145)

## 2019-07-08 LAB — HEPATIC FUNCTION PANEL
ALT: 21 U/L (ref 0–44)
AST: 16 U/L (ref 15–41)
Albumin: 4.4 g/dL (ref 3.5–5.0)
Alkaline Phosphatase: 96 U/L (ref 38–126)
Bilirubin, Direct: 0.1 mg/dL (ref 0.0–0.2)
Indirect Bilirubin: 0.4 mg/dL (ref 0.3–0.9)
Total Bilirubin: 0.5 mg/dL (ref 0.3–1.2)
Total Protein: 7.2 g/dL (ref 6.5–8.1)

## 2019-07-08 LAB — CBC
HCT: 43 % (ref 36.0–46.0)
Hemoglobin: 13.7 g/dL (ref 12.0–15.0)
MCH: 30.3 pg (ref 26.0–34.0)
MCHC: 31.9 g/dL (ref 30.0–36.0)
MCV: 95.1 fL (ref 80.0–100.0)
Platelets: 337 10*3/uL (ref 150–400)
RBC: 4.52 MIL/uL (ref 3.87–5.11)
RDW: 14.2 % (ref 11.5–15.5)
WBC: 11.1 10*3/uL — ABNORMAL HIGH (ref 4.0–10.5)
nRBC: 0 % (ref 0.0–0.2)

## 2019-07-08 LAB — URINALYSIS, ROUTINE W REFLEX MICROSCOPIC
Bacteria, UA: NONE SEEN
Bilirubin Urine: NEGATIVE
Glucose, UA: NEGATIVE mg/dL
Hgb urine dipstick: NEGATIVE
Ketones, ur: NEGATIVE mg/dL
Nitrite: NEGATIVE
Protein, ur: 100 mg/dL — AB
Specific Gravity, Urine: 1.012 (ref 1.005–1.030)
pH: 5 (ref 5.0–8.0)

## 2019-07-08 LAB — LIPASE, BLOOD: Lipase: 15 U/L (ref 11–51)

## 2019-07-08 MED ORDER — ACETAMINOPHEN 500 MG PO TABS
1000.0000 mg | ORAL_TABLET | Freq: Once | ORAL | Status: AC
  Administered 2019-07-08: 1000 mg via ORAL
  Filled 2019-07-08: qty 2

## 2019-07-08 NOTE — ED Provider Notes (Signed)
[facility] EMERGENCY DEPARTMENT Provider Note   CSN: [id number] Arrival date & time: 07/08/19  1220     History Chief Complaint  Patient presents with  . Flank Pain    Virginia Huber is a 74 y.o. female.  HPI  Virginia Huber is a 74y/o female with PMH of CHF with pacemaker, CAD, Hx of Mi with 2 stents placed, HTN, T2DM, HLD, CKD stage 3, and Hx of total hystrectomy presenting with two weeks of progressive right sided flank pain that radiates to her groin area. She states it is now constant and feels just like a "gnawing pain" that does not respond to heating pads, tylenol, or changes in position. It has been affecting her sleep. She went to urgent care a few days ago and was told she had a UTI and was started on antibiotics. Since starting antibiotics the pain has gotten worse. She endorses no difficulty with urination, no fever, chills, no vomiting. She states she never had any dysuria, urinary frequency, or urgency. She has no difficulty voiding and endorses no feeling of incomplete voiding. She does endorse some mild nausea with loss of appetite but has been able to eat some foods and no difficulty drinking fluids.    Past Medical History:  Diagnosis Date  . Arthritis   . Atrial fibrillation and flutter ([place])    a. h/o PAF/flutter during admission in 2013 for PNA. b. PAF during adm for NSTEMI 07/2015, subsequent paroxysms since then.  . B12 deficiency anemia   . Cardiac tamponade 06/2016  . CHF (congestive heart failure) ([place])   . Coronary artery disease 11/30/2014   a. remote MI. b. h/o PTCA with scoring balloon to OM1 11/2014. c. NSTEMI 03/2015 s/p DES to prox-mid Cx. d. NSTEMI 07/2015 s/p scoring balloon/PTCA/DES to dRCA with PAF during that admission  . Cutaneous lupus erythematosus   . GERD (gastroesophageal reflux disease)   . Heart block   . History of blood transfusion 1980's   2nd surgical procedures  . HTN (hypertension)   . Hypercholesteremia   . Hypothyroidism   .  Myocardial infarction ([place]) 02/2012  . Ovarian tumor   . PAD (peripheral artery disease) ([place])    a. s/p LE angio 2015; followed by Dr. Fletcher Anon - managed medically.  . Pain with urination 05/08/2015  . Paroxysmal atrial flutter ([place])   . Pericardial effusion    a. 06/2016 after ppm - s/p pericardiocentesis.  . Superficial fungus infection of skin 06/29/2013  . Tachy-brady syndrome ([place])    a. s/p Medtronic PPM 06/2016, c/b lead perf/pericardial effusion.  [name] TIA (transient ischemic attack) 08/2001; ~ 2006  . Type II diabetes mellitus ([place])   . UTI (urinary tract infection) 05/08/2013    Patient Active Problem List   Diagnosis Date Noted  . Acute on chronic diastolic HF (heart failure) ([place]) 12/17/2017  . GERD (gastroesophageal reflux disease) 12/17/2017  . Typical atrial flutter ([place])   . 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 Paroxysmal atrial fibrillation ([place]) 12/08/2017  . 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 CKD (chronic kidney disease) stage 3, GFR 30-59 ml/min 09/23/2017  . Fever 09/23/2017  . S/P pericardiocentesis 06/28/2016  . Acute blood loss anemia 06/28/2016  . Pericardial effusion 06/26/2016  . Tachy-brady syndrome ([place]) 06/25/2016  . Tamponade   . Bradycardia 06/14/2016  . Junctional bradycardia   . Coronary artery disease involving coronary bypass graft of native heart with unstable angina pectoris ([place])   .  Acute diastolic CHF (congestive heart failure), NYHA class 3 ([place])   . Systolic congestive heart failure ([place]) 05/13/2016  . Multiple and bilateral precerebral artery syndromes 05/13/2016  . OSA (obstructive sleep apnea) 05/13/2016  . Chronic diarrhea 12/25/2015  . Chest pain 08/02/2015  . Atrial fibrillation with rapid ventricular response ([place])   . Pain with urination 05/08/2015  . NSTEMI (non-ST elevated myocardial infarction) ([place]) 04/02/2015  . CAD in native artery 11/30/2014  . Coronary artery disease involving  native coronary artery with other forms of angina pectoris   . PAD (peripheral artery disease) ([place]) 12/26/2013  . Superficial fungus infection of skin 06/29/2013  . UTI (urinary tract infection) 05/08/2013  . Hypokalemia 03/05/2012  . B12 deficiency anemia 03/02/2012  . Bronchospasm 03/02/2012  . Community acquired bacterial pneumonia 03/01/2012  . Acute respiratory failure with hypoxia ([place]) 03/01/2012  . DM type 2 causing vascular disease ([place]) 02/29/2012  . Hypothyroidism 02/28/2012  . RLQ abdominal pain 11/24/2010  . OVERWEIGHT/OBESITY 06/03/2010  . Essential hypertension 06/03/2010  . Mixed hyperlipidemia 12/27/2009  . Palpitations 05/17/2009  . FRACTURE, TOE 12/06/2007    Past Surgical History:  Procedure Laterality Date  . ABDOMINAL AORTAGRAM N/A 01/03/2014   Procedure: ABDOMINAL [name];  Surgeon: Wellington Hampshire, MD;  Location: [facility] CATH LAB;  Service: Cardiovascular;  Laterality: N/A;  . ABDOMINAL HYSTERECTOMY  1972   "partial"  . APPENDECTOMY  1970's  . CARDIAC CATHETERIZATION  2008   Tiny OM-2 with 90% narrowing. Med tx.  [name] CARDIAC CATHETERIZATION N/A 11/30/2014   Procedure: Left Heart Cath and Coronary Angiography;  Surgeon: Troy Sine, MD; LAD 20%, CFX 50%, OM1 95%, right PLB 30%, LV normal   . CARDIAC CATHETERIZATION N/A 11/30/2014   Procedure: Coronary Balloon Angioplasty;  Surgeon: Troy Sine, MD;  Angiosculpt scoring balloon and PTCA to the OM1 reducing stenosis from 95% to less than 10%  . CARDIAC CATHETERIZATION N/A 04/03/2015   Procedure: Left Heart Cath and Coronary Angiography;  Surgeon: Jolaine Artist, MD; dLAD 50%, CFX 90%, OM1 100%, PLA 15%, LVEDP 13    . CARDIAC CATHETERIZATION N/A 04/03/2015   Procedure: Coronary Stent Intervention;  Surgeon: Sherren Mocha, MD; 3.0x18 mm Xience DES to the CFX    . CARDIAC CATHETERIZATION N/A 08/02/2015   Procedure: Left Heart Cath and Coronary Angiography;  Surgeon: Troy Sine, MD;  Location: [place]  CV LAB;  Service: Cardiovascular;  Laterality: N/A;  . CARDIAC CATHETERIZATION N/A 08/02/2015   Procedure: Coronary Stent Intervention;  Surgeon: Troy Sine, MD;  Location: [facility] CV LAB;  Service: Cardiovascular;  Laterality: N/A;  . CARDIAC CATHETERIZATION N/A 06/25/2016   Procedure: Pericardiocentesis;  Surgeon: Will Meredith Leeds, MD;  Location: [facility] CV LAB;  Service: Cardiovascular;  Laterality: N/A;  . cardiac stents    . CARDIOVERSION N/A 12/15/2017   Procedure: CARDIOVERSION;  Surgeon: Acie Fredrickson Wonda Cheng, MD;  Location: [facility] ENDOSCOPY;  Service: Cardiovascular;  Laterality: N/A;  . CHOLECYSTECTOMY OPEN  1990's  . COLONOSCOPY  2005   Dr. Laural Golden: pancolonic divericula, polyp, path unknown currently  . COLONOSCOPY  2012   Dr. Oneida Alar: Normal TI, scattered diverticula in entire colon, small internal hemorrhoids, normal colon biopsies. Colonoscopy in 5-10 years.   . COLOSTOMY  05/1979  . COLOSTOMY REVERSAL  11/1979  . EP IMPLANTABLE DEVICE N/A 06/25/2016   Procedure: Lead Revision/Repair;  Surgeon: Will Meredith Leeds, MD;  Location: [facility] CV LAB;  Service: Cardiovascular;  Laterality: N/A;  . EP IMPLANTABLE  DEVICE N/A 06/25/2016   Procedure: Pacemaker Implant;  Surgeon: Will Meredith Leeds, MD;  Location: [facility] CV LAB;  Service: Cardiovascular;  Laterality: N/A;  . EXCISIONAL HEMORRHOIDECTOMY  1970's  . EYE SURGERY Left 2000   "branch vein occlusion"  . EYE SURGERY Left ~ 2001   "smoothed out wrinkle"  . LEFT OOPHORECTOMY  05/1979   nicked bowel, peritonitis, colostomy; colostomy reversed 1981   . LOWER EXTREMITY ANGIOGRAM N/A 01/03/2014   Procedure: LOWER EXTREMITY ANGIOGRAM;  Surgeon: Wellington Hampshire, MD;  Location: [facility] CATH LAB;  Service: Cardiovascular;  Laterality: N/A;  . Nuclear med stress test  10/2011   Small area of mild ischemia inferoapically.  [name] PARTIAL HYSTERECTOMY  1970's   left ovaries, then ovaries removed later due tumors   . RIGHT OOPHORECTOMY   1970's     OB History    Gravida  3   Para  2   Term      Preterm      AB  1   Living  2     SAB  1   TAB      Ectopic      Multiple      Live Births  2           Family History  Problem Relation Age of Onset  . Heart disease Mother        deceased  . Heart disease Father        deceased, heart disease  . Diabetes Brother   . Heart disease Brother   . Thyroid disease Brother   . Heart disease Sister   . Heart disease Brother   . Thyroid disease Brother   . Lupus Daughter   . Colon cancer Neg Hx     Social History   Tobacco Use  . Smoking status: Never Smoker  . Smokeless tobacco: Never Used  . Tobacco comment: Never smoked  Substance Use Topics  . Alcohol use: No    Alcohol/week: 0.0 standard drinks  . Drug use: No    Home Medications Prior to Admission medications   Medication Sig Start Date End Date Taking? Authorizing Provider  acetaminophen (TYLENOL) 325 MG tablet Take 650 mg by mouth every 6 (six) hours as needed for headache.    [provider]  amiodarone (PACERONE) 200 MG tablet Take 0.5 tablets (100 mg total) by mouth daily. 08/16/18   Camnitz, Ocie Doyne, MD  apixaban (ELIQUIS) 5 MG TABS tablet Take 1 tablet (5 mg total) by mouth 2 (two) times daily. 01/17/19   Herminio Commons, MD  atorvastatin (LIPITOR) 80 MG tablet TAKE ONE TABLET BY MOUTH EVERY DAY AT 6:00PM 10/06/18   Herminio Commons, MD  carvedilol (COREG) 6.25 MG tablet TAKE (6.25MG  TOTAL) BY MOUTH TWO TIMES DAILY 09/12/18   Camnitz, Ocie Doyne, MD  Cholecalciferol (VITAMIN D) 2000 units tablet Take 2,000 Units by mouth daily.     [provider]  clopidogrel (PLAVIX) 75 MG tablet TAKE ONE TABLET (75MG  TOTAL) BY MOUTH DAILY 03/20/19   Herminio Commons, MD  diltiazem (CARDIZEM CD) 360 MG 24 hr capsule Take 1 capsule (360 mg total) by mouth daily. 01/25/19   Herminio Commons, MD  diphenhydramine-acetaminophen (TYLENOL PM EXTRA STRENGTH) 25-500 MG TABS  tablet Take 1 tablet by mouth at bedtime as needed (Take as directed).     [provider]  furosemide (LASIX) 40 MG tablet TAKE ONE TABLET (40MG  TOTAL) BY MOUTH TWO TIMES DAILY  07/05/18   Herminio Commons, MD  glucose blood (FREESTYLE LITE) test strip USE AS DIRECTED TWICE DAILY. 05/05/17   Cassandria Anger, MD  insulin NPH-regular Human (NOVOLIN 70/30) (70-30) 100 UNIT/ML injection Inject 40 Units into the skin See admin instructions. Inject 25 units before breakfast and 25 units before supper  - only if pre-meal blood glucose is above 90 mg/dL. 09/30/17   Kathie Dike, MD  levothyroxine (SYNTHROID) 137 MCG tablet Take 137 mcg by mouth daily before breakfast.    [provider]  losartan (COZAAR) 100 MG tablet TAKE ONE (1) TABLET BY MOUTH EVERY DAY 11/23/18   Herminio Commons, MD  metFORMIN (GLUCOPHAGE) 500 MG tablet TAKE ONE TABLET BY MOUTH TWICE A DAY WITH A MEAL 08/26/17   Nida, Marella Chimes, MD  nitroGLYCERIN (NITROSTAT) 0.4 MG SL tablet Place 1 tablet (0.4 mg total) under the tongue every 5 (five) minutes as needed for chest pain. 11/25/17   Rolland Porter, MD  potassium chloride SA (KLOR-CON) 20 MEQ tablet TAKE ONE (1) TABLET BY MOUTH EVERY DAY 05/22/19   Herminio Commons, MD  rOPINIRole (REQUIP) 0.5 MG tablet Take 0.5 mg by mouth at bedtime.  03/05/14   [provider]  sulfamethoxazole-trimethoprim (BACTRIM DS) 800-160 MG tablet Take 1 tablet by mouth 2 (two) times daily for 10 days. 07/05/19 07/15/19  Wurst, Marye Round, PA-C  traZODone (DESYREL) 50 MG tablet Take 0.5-1 tablets (25-50 mg total) by mouth at bedtime as needed for sleep. 02/08/19   Dohmeier, Asencion Partridge, MD    Allergies    Penicillins and Percocet [oxycodone-acetaminophen]  Review of Systems   Review of Systems  Constitutional: Positive for appetite change. Negative for chills, diaphoresis, fatigue and fever.  HENT: Negative for rhinorrhea, sinus pain, sneezing and trouble swallowing.    Respiratory: Negative for cough, chest tightness, shortness of breath and wheezing.   Cardiovascular: Negative for chest pain, palpitations and leg swelling.  Gastrointestinal: Positive for nausea. Negative for abdominal distention, abdominal pain, constipation, diarrhea and vomiting.  Genitourinary: Negative for difficulty urinating, dysuria, frequency and urgency.  Musculoskeletal: Positive for back pain. Negative for arthralgias and joint swelling.  Skin: Negative.     Physical Exam Updated Vital Signs BP (!) 168/94 (BP Location: Right Arm)   Pulse 65   Temp 98 F (36.7 C) (Oral)   Resp 16   Ht 5\' 3"  (1.6 m)   Wt 68 kg   SpO2 100%   BMI 26.57 kg/m   Physical Exam Constitutional:      General: She is not in acute distress.    Appearance: She is not ill-appearing.  HENT:     Head: Normocephalic and atraumatic.  Eyes:     General: No scleral icterus.    Extraocular Movements: Extraocular movements intact.     Pupils: Pupils are equal, round, and reactive to light.  Cardiovascular:     Rate and Rhythm: Normal rate and regular rhythm.     Pulses: Normal pulses.     Heart sounds: Normal heart sounds. No murmur.  Pulmonary:     Effort: Pulmonary effort is normal.     Breath sounds: Normal breath sounds. No wheezing, rhonchi or rales.  Abdominal:     General: Abdomen is flat. Bowel sounds are normal.     Palpations: Abdomen is soft.     Tenderness: There is no abdominal tenderness. There is right CVA tenderness. There is no left CVA tenderness, guarding or rebound.  Skin:    General: Skin  is warm and dry.     Findings: No erythema or rash.  Neurological:     General: No focal deficit present.     Mental Status: She is alert.     ED Results / Procedures / Treatments   Labs (all labs ordered are listed, but only abnormal results are displayed) Labs Reviewed  URINALYSIS, ROUTINE W REFLEX MICROSCOPIC - Abnormal; Notable for the following components:      Result Value    Protein, ur 100 (*)    Leukocytes,Ua TRACE (*)    All other components within normal limits  BASIC METABOLIC PANEL - Abnormal; Notable for the following components:   BUN 25 (*)    Creatinine, Ser 1.71 (*)    GFR calc non Af Amer 29 (*)    GFR calc Af Amer 34 (*)    All other components within normal limits  CBC - Abnormal; Notable for the following components:   WBC 11.1 (*)    All other components within normal limits  URINE CULTURE  HEPATIC FUNCTION PANEL  LIPASE, BLOOD    EKG None  Radiology No results found.  Procedures Procedures (including critical care time)  Medications Ordered in ED Medications  acetaminophen (TYLENOL) tablet 1,000 mg (has no administration in time range)    ED Course  I have reviewed the triage vital signs and the nursing notes.  Pertinent labs & imaging results that were available during my care of the patient were reviewed by me and considered in my medical decision making (see chart for details).  Virginia Huber is a 74y/o female with a complicated PMH with CAD, Hx of NSTEMI s/p 2 stents, CHF with pacemaker, HTN, T2DM, HLD, CKD III, and history of multiple abdominal surgeries who presented to the ED for two weeks of progressively worsening right sided flank pain that radiated to her groin. She was treated outpatient for a UTI and it did not improve with antibiotics. Her workup in the ED was significant for slightly elevated serum creatinine above baseline at 1.71, WBC of 11.1, and a U/A positive for trace Leuks and 100 protein but negative for nitrites and negative for blood. Lipase was normal. Hepatic function panel was also normal. A CT noncontrast abdomen and pelvis was pending at time of sign out.  Virginia Huber vitals remained stable and she had no documented temperature while in the ED. She was given 1g of Tylenol to help with pain to try to avoid NSAIDS given her CKD stage III. She has an allergy to oxycodone so that was also avoided at  this time.   Her differential remains broad and includes UTI with resistance organism, less likely pyelonephritis, nephrolithiasis, or an MSK acute strain of the thoracolumbar musculature.    MDM Rules/Calculators/A&P                     Virginia Huber was evaluated in Emergency Department on 07/08/2019 for the symptoms described in the history of present illness. She was evaluated in the context of the global COVID-19 pandemic, which necessitated consideration that the patient might be at risk for infection with the SARS-CoV-2 virus that causes COVID-19. Institutional protocols and algorithms that pertain to the evaluation of patients at risk for COVID-19 are in a state of rapid change based on information released by regulatory bodies including the CDC and federal and state organizations. These policies and algorithms were followed during the patient's care in the ED.   Final Clinical Impression(s) /  ED Diagnoses Final diagnoses:  Flank pain    Rx / DC Orders ED Discharge Orders    None       Nuala Alpha, DO 07/08/19 1509    Elnora Morrison, MD 07/10/19 1256

## 2019-07-08 NOTE — Discharge Instructions (Signed)
CT scan showed no life-threatening condition.  Creatinine which is a measure of kidney function was slightly elevated at 1.71.  This has been elevated before.  Follow-up with your primary care doctor.

## 2019-07-08 NOTE — ED Triage Notes (Signed)
Pt has c/o right side flank pain, pt describes pain that radiates from right flank down to right abdomen. Patient was treated for a UTI at urgent care on 1/13 and prescribed antibiotics without relief.

## 2019-07-08 NOTE — ED Provider Notes (Signed)
Patient reexamined prior to discharge.  No acute distress.  Discussed test findings.  She will follow-up with her primary care doctor.   Nat Christen, MD 07/08/19 1739

## 2019-07-10 DIAGNOSIS — Z0001 Encounter for general adult medical examination with abnormal findings: Secondary | ICD-10-CM | POA: Diagnosis not present

## 2019-07-10 DIAGNOSIS — E663 Overweight: Secondary | ICD-10-CM | POA: Diagnosis not present

## 2019-07-10 DIAGNOSIS — I10 Essential (primary) hypertension: Secondary | ICD-10-CM | POA: Diagnosis not present

## 2019-07-10 DIAGNOSIS — E039 Hypothyroidism, unspecified: Secondary | ICD-10-CM | POA: Diagnosis not present

## 2019-07-10 DIAGNOSIS — E119 Type 2 diabetes mellitus without complications: Secondary | ICD-10-CM | POA: Diagnosis not present

## 2019-07-10 DIAGNOSIS — Z1389 Encounter for screening for other disorder: Secondary | ICD-10-CM | POA: Diagnosis not present

## 2019-07-10 DIAGNOSIS — Z6826 Body mass index (BMI) 26.0-26.9, adult: Secondary | ICD-10-CM | POA: Diagnosis not present

## 2019-07-10 DIAGNOSIS — Z0000 Encounter for general adult medical examination without abnormal findings: Secondary | ICD-10-CM | POA: Diagnosis not present

## 2019-07-10 LAB — URINE CULTURE: Culture: <10000 — AB

## 2019-07-11 ENCOUNTER — Ambulatory Visit (INDEPENDENT_AMBULATORY_CARE_PROVIDER_SITE_OTHER): Payer: PPO | Admitting: *Deleted

## 2019-07-11 DIAGNOSIS — I495 Sick sinus syndrome: Secondary | ICD-10-CM

## 2019-07-11 LAB — CUP PACEART REMOTE DEVICE CHECK
Battery Remaining Longevity: 86 mo
Battery Voltage: 3.01 V
Brady Statistic AP VP Percent: 0.05 %
Brady Statistic AP VS Percent: 91.46 %
Brady Statistic AS VP Percent: 0.02 %
Brady Statistic AS VS Percent: 8.47 %
Brady Statistic RA Percent Paced: 91.51 %
Brady Statistic RV Percent Paced: 0.07 %
Date Time Interrogation Session: 20210119101745
Implantable Lead Implant Date: 20180104
Implantable Lead Implant Date: 20180104
Implantable Lead Location: 753859
Implantable Lead Location: 753860
Implantable Lead Model: 5076
Implantable Lead Model: 5076
Implantable Pulse Generator Implant Date: 20180104
Lead Channel Impedance Value: 323 Ohm
Lead Channel Impedance Value: 361 Ohm
Lead Channel Impedance Value: 361 Ohm
Lead Channel Impedance Value: 475 Ohm
Lead Channel Pacing Threshold Amplitude: 0.625 V
Lead Channel Pacing Threshold Amplitude: 1.125 V
Lead Channel Pacing Threshold Pulse Width: 0.4 ms
Lead Channel Pacing Threshold Pulse Width: 0.4 ms
Lead Channel Sensing Intrinsic Amplitude: 1.125 mV
Lead Channel Sensing Intrinsic Amplitude: 1.125 mV
Lead Channel Sensing Intrinsic Amplitude: 12.375 mV
Lead Channel Sensing Intrinsic Amplitude: 12.375 mV
Lead Channel Setting Pacing Amplitude: 2 V
Lead Channel Setting Pacing Amplitude: 2.5 V
Lead Channel Setting Pacing Pulse Width: 0.4 ms
Lead Channel Setting Sensing Sensitivity: 2.8 mV

## 2019-07-14 ENCOUNTER — Other Ambulatory Visit (HOSPITAL_COMMUNITY): Payer: Self-pay | Admitting: Nephrology

## 2019-07-14 DIAGNOSIS — N189 Chronic kidney disease, unspecified: Secondary | ICD-10-CM | POA: Diagnosis not present

## 2019-07-14 DIAGNOSIS — N200 Calculus of kidney: Secondary | ICD-10-CM | POA: Diagnosis not present

## 2019-07-14 DIAGNOSIS — E1129 Type 2 diabetes mellitus with other diabetic kidney complication: Secondary | ICD-10-CM | POA: Diagnosis not present

## 2019-07-14 DIAGNOSIS — R809 Proteinuria, unspecified: Secondary | ICD-10-CM | POA: Diagnosis not present

## 2019-07-14 DIAGNOSIS — E559 Vitamin D deficiency, unspecified: Secondary | ICD-10-CM | POA: Diagnosis not present

## 2019-07-14 DIAGNOSIS — I5032 Chronic diastolic (congestive) heart failure: Secondary | ICD-10-CM | POA: Diagnosis not present

## 2019-07-14 DIAGNOSIS — E1122 Type 2 diabetes mellitus with diabetic chronic kidney disease: Secondary | ICD-10-CM | POA: Diagnosis not present

## 2019-07-17 DIAGNOSIS — R809 Proteinuria, unspecified: Secondary | ICD-10-CM | POA: Diagnosis not present

## 2019-07-17 DIAGNOSIS — N200 Calculus of kidney: Secondary | ICD-10-CM | POA: Diagnosis not present

## 2019-07-17 DIAGNOSIS — I5032 Chronic diastolic (congestive) heart failure: Secondary | ICD-10-CM | POA: Diagnosis not present

## 2019-07-17 DIAGNOSIS — E1129 Type 2 diabetes mellitus with other diabetic kidney complication: Secondary | ICD-10-CM | POA: Diagnosis not present

## 2019-07-17 DIAGNOSIS — E559 Vitamin D deficiency, unspecified: Secondary | ICD-10-CM | POA: Diagnosis not present

## 2019-07-17 DIAGNOSIS — E1122 Type 2 diabetes mellitus with diabetic chronic kidney disease: Secondary | ICD-10-CM | POA: Diagnosis not present

## 2019-07-17 DIAGNOSIS — N189 Chronic kidney disease, unspecified: Secondary | ICD-10-CM | POA: Diagnosis not present

## 2019-07-19 ENCOUNTER — Other Ambulatory Visit: Payer: Self-pay

## 2019-07-19 ENCOUNTER — Encounter (HOSPITAL_COMMUNITY)
Admission: RE | Admit: 2019-07-19 | Discharge: 2019-07-19 | Disposition: A | Discharge status: Home / Self Care | Payer: PPO | Source: Ambulatory Visit | Attending: Nephrology | Admitting: Nephrology

## 2019-07-19 DIAGNOSIS — I5032 Chronic diastolic (congestive) heart failure: Secondary | ICD-10-CM | POA: Insufficient documentation

## 2019-07-19 DIAGNOSIS — I509 Heart failure, unspecified: Secondary | ICD-10-CM | POA: Diagnosis not present

## 2019-07-19 DIAGNOSIS — N189 Chronic kidney disease, unspecified: Secondary | ICD-10-CM | POA: Diagnosis not present

## 2019-07-19 MED ORDER — FUROSEMIDE 10 MG/ML IJ SOLN
INTRAMUSCULAR | Status: AC
  Administered 2019-07-19: 34 mg via INTRAVENOUS
  Filled 2019-07-19: qty 4

## 2019-07-19 MED ORDER — TECHNETIUM TC 99M MERTIATIDE
5.0000 | Freq: Once | INTRAVENOUS | Status: AC | PRN
  Administered 2019-07-19: 10:00:00 5.2 via INTRAVENOUS

## 2019-07-19 MED ORDER — FUROSEMIDE 10 MG/ML IJ SOLN: 0.5000 mg/kg | Freq: Once | INTRAMUSCULAR | Status: AC

## 2019-07-28 DIAGNOSIS — R7989 Other specified abnormal findings of blood chemistry: Secondary | ICD-10-CM | POA: Diagnosis not present

## 2019-07-28 DIAGNOSIS — E559 Vitamin D deficiency, unspecified: Secondary | ICD-10-CM | POA: Diagnosis not present

## 2019-07-28 DIAGNOSIS — E1129 Type 2 diabetes mellitus with other diabetic kidney complication: Secondary | ICD-10-CM | POA: Diagnosis not present

## 2019-07-28 DIAGNOSIS — E1122 Type 2 diabetes mellitus with diabetic chronic kidney disease: Secondary | ICD-10-CM | POA: Diagnosis not present

## 2019-07-28 DIAGNOSIS — N189 Chronic kidney disease, unspecified: Secondary | ICD-10-CM | POA: Diagnosis not present

## 2019-07-28 DIAGNOSIS — I5032 Chronic diastolic (congestive) heart failure: Secondary | ICD-10-CM | POA: Diagnosis not present

## 2019-07-28 DIAGNOSIS — E211 Secondary hyperparathyroidism, not elsewhere classified: Secondary | ICD-10-CM | POA: Diagnosis not present

## 2019-07-28 DIAGNOSIS — R809 Proteinuria, unspecified: Secondary | ICD-10-CM | POA: Diagnosis not present

## 2019-07-31 DIAGNOSIS — Z87442 Personal history of urinary calculi: Secondary | ICD-10-CM | POA: Diagnosis not present

## 2019-08-01 DIAGNOSIS — Z6827 Body mass index (BMI) 27.0-27.9, adult: Secondary | ICD-10-CM | POA: Diagnosis not present

## 2019-08-01 DIAGNOSIS — E538 Deficiency of other specified B group vitamins: Secondary | ICD-10-CM | POA: Diagnosis not present

## 2019-08-01 DIAGNOSIS — E663 Overweight: Secondary | ICD-10-CM | POA: Diagnosis not present

## 2019-08-01 DIAGNOSIS — R809 Proteinuria, unspecified: Secondary | ICD-10-CM | POA: Diagnosis not present

## 2019-08-01 DIAGNOSIS — N184 Chronic kidney disease, stage 4 (severe): Secondary | ICD-10-CM | POA: Diagnosis not present

## 2019-08-03 DIAGNOSIS — E113411 Type 2 diabetes mellitus with severe nonproliferative diabetic retinopathy with macular edema, right eye: Secondary | ICD-10-CM | POA: Diagnosis not present

## 2019-08-03 DIAGNOSIS — H43811 Vitreous degeneration, right eye: Secondary | ICD-10-CM | POA: Diagnosis not present

## 2019-08-03 DIAGNOSIS — H34831 Tributary (branch) retinal vein occlusion, right eye, with macular edema: Secondary | ICD-10-CM | POA: Diagnosis not present

## 2019-08-03 DIAGNOSIS — H33301 Unspecified retinal break, right eye: Secondary | ICD-10-CM | POA: Diagnosis not present

## 2019-08-15 ENCOUNTER — Other Ambulatory Visit: Payer: Self-pay

## 2019-08-15 ENCOUNTER — Telehealth: Payer: Self-pay

## 2019-08-15 ENCOUNTER — Ambulatory Visit: Payer: PPO | Admitting: Cardiology

## 2019-08-15 ENCOUNTER — Encounter: Payer: Self-pay | Admitting: Cardiology

## 2019-08-15 VITALS — BP 150/78 | HR 83 | Ht 63.0 in | Wt 152.6 lb

## 2019-08-15 DIAGNOSIS — Z79899 Other long term (current) drug therapy: Secondary | ICD-10-CM | POA: Diagnosis not present

## 2019-08-15 DIAGNOSIS — R001 Bradycardia, unspecified: Secondary | ICD-10-CM | POA: Diagnosis not present

## 2019-08-15 DIAGNOSIS — I480 Paroxysmal atrial fibrillation: Secondary | ICD-10-CM

## 2019-08-15 MED ORDER — CARVEDILOL 25 MG PO TABS: 25.0000 mg | ORAL_TABLET | Freq: Two times a day (BID) | ORAL | 1 refills | Status: DC

## 2019-08-15 MED ORDER — CARVEDILOL 12.5 MG PO TABS: 12.5000 mg | ORAL_TABLET | Freq: Two times a day (BID) | ORAL | 2 refills | Status: DC

## 2019-08-15 MED ORDER — AMIODARONE HCL 200 MG PO TABS: 100.0000 mg | ORAL_TABLET | Freq: Every day | ORAL | 1 refills | Status: DC

## 2019-08-15 NOTE — Telephone Encounter (Signed)
Pharmacy called stating they had some questions about the medications that were sent over for this pt today. They would like a call back at [phone]

## 2019-08-15 NOTE — Progress Notes (Signed)
Electrophysiology Office Note   Date:  08/15/2019   ID:  Virginia, Huber March 16, 1946, MRN [id number]  PCP:  Sharilyn Sites, MD  Cardiologist:  Bronson Ing Primary Electrophysiologist:  Tateanna Bach Meredith Leeds, MD    No chief complaint on file.    History of Present Illness: Virginia Huber is a 74 y.o. female who presents today for electrophysiology evaluation.   She has a history of CAD status post MI and PCI of the OM1, with an an STEMI 03/2015 status post DES to the circumflex, an STEMI 07/2015 with PCI to the RCA and paroxysmal atrial fibrillation during that admission. She is on Eliquis. Sleep study showed mild sleep apnea. He was in the hospital on 12/17 and was found to be in A. fib with RVR. She converted to sinus rhythm after arrival. He was readmitted to the hospital on 12/23 and was noted to be in atrial flutter with rapid rates at that time. She had junctional rhythm and near-syncope after conversion to sinus rhythm. She had a postconversion pause of 5.6 seconds. Pacemaker placed 06/25/16 with lead revision 06/26/16. Device implant was complicated by pericardial effusion and tamponade requiring emergent pericardiocentesis.  Today, denies symptoms of palpitations, chest pain, shortness of breath, orthopnea, PND, lower extremity edema, claudication, dizziness, presyncope, syncope, bleeding, or neurologic sequela. The patient is tolerating medications without difficulties.  Overall she is doing well.  She was recently in the emergency room with a UTI.  She was found to have an elevated creatinine at the time and now has follow-up with nephrology.  She is noted no arrhythmias.  Past Medical History:  Diagnosis Date  . Arthritis   . Atrial fibrillation and flutter ([place])    a. h/o PAF/flutter during admission in 2013 for PNA. b. PAF during adm for NSTEMI 07/2015, subsequent paroxysms since then.  . B12 deficiency anemia   . Cardiac tamponade 06/2016  . CHF (congestive heart failure) ([place])    . Coronary artery disease 11/30/2014   a. remote MI. b. h/o PTCA with scoring balloon to OM1 11/2014. c. NSTEMI 03/2015 s/p DES to prox-mid Cx. d. NSTEMI 07/2015 s/p scoring balloon/PTCA/DES to dRCA with PAF during that admission  . Cutaneous lupus erythematosus   . GERD (gastroesophageal reflux disease)   . Heart block   . History of blood transfusion 1980's   2nd surgical procedures  . HTN (hypertension)   . Hypercholesteremia   . Hypothyroidism   . Myocardial infarction ([place]) 02/2012  . Ovarian tumor   . PAD (peripheral artery disease) ([place])    a. s/p LE angio 2015; followed by Dr. Fletcher Anon - managed medically.  . Pain with urination 05/08/2015  . Paroxysmal atrial flutter ([place])   . Pericardial effusion    a. 06/2016 after ppm - s/p pericardiocentesis.  . Superficial fungus infection of skin 06/29/2013  . Tachy-brady syndrome ([place])    a. s/p Medtronic PPM 06/2016, c/b lead perf/pericardial effusion.  [name] TIA (transient ischemic attack) 08/2001; ~ 2006  . Type II diabetes mellitus ([place])   . UTI (urinary tract infection) 05/08/2013   Past Surgical History:  Procedure Laterality Date  . ABDOMINAL AORTAGRAM N/A 01/03/2014   Procedure: ABDOMINAL [name];  Surgeon: Wellington Hampshire, MD;  Location: [facility] CATH LAB;  Service: Cardiovascular;  Laterality: N/A;  . ABDOMINAL HYSTERECTOMY  1972   "partial"  . APPENDECTOMY  1970's  . CARDIAC CATHETERIZATION  2008   Tiny OM-2 with 90% narrowing. Med tx.  [name] CARDIAC CATHETERIZATION N/A 11/30/2014  Procedure: Left Heart Cath and Coronary Angiography;  Surgeon: Troy Sine, MD; LAD 20%, CFX 50%, OM1 95%, right PLB 30%, LV normal   . CARDIAC CATHETERIZATION N/A 11/30/2014   Procedure: Coronary Balloon Angioplasty;  Surgeon: Troy Sine, MD;  Angiosculpt scoring balloon and PTCA to the OM1 reducing stenosis from 95% to less than 10%  . CARDIAC CATHETERIZATION N/A 04/03/2015   Procedure: Left Heart Cath and Coronary Angiography;  Surgeon: Jolaine Artist, MD; dLAD 50%, CFX 90%, OM1 100%, PLA 15%, LVEDP 13    . CARDIAC CATHETERIZATION N/A 04/03/2015   Procedure: Coronary Stent Intervention;  Surgeon: Sherren Mocha, MD; 3.0x18 mm Xience DES to the CFX    . CARDIAC CATHETERIZATION N/A 08/02/2015   Procedure: Left Heart Cath and Coronary Angiography;  Surgeon: Troy Sine, MD;  Location: [facility] CV LAB;  Service: Cardiovascular;  Laterality: N/A;  . CARDIAC CATHETERIZATION N/A 08/02/2015   Procedure: Coronary Stent Intervention;  Surgeon: Troy Sine, MD;  Location: [facility] CV LAB;  Service: Cardiovascular;  Laterality: N/A;  . CARDIAC CATHETERIZATION N/A 06/25/2016   Procedure: Pericardiocentesis;  Surgeon: Lacreasha Hinds Meredith Leeds, MD;  Location: [facility] CV LAB;  Service: Cardiovascular;  Laterality: N/A;  . cardiac stents    . CARDIOVERSION N/A 12/15/2017   Procedure: CARDIOVERSION;  Surgeon: Acie Fredrickson Wonda Cheng, MD;  Location: [facility] ENDOSCOPY;  Service: Cardiovascular;  Laterality: N/A;  . CHOLECYSTECTOMY OPEN  1990's  . COLONOSCOPY  2005   Dr. Laural Golden: pancolonic divericula, polyp, path unknown currently  . COLONOSCOPY  2012   Dr. Oneida Alar: Normal TI, scattered diverticula in entire colon, small internal hemorrhoids, normal colon biopsies. Colonoscopy in 5-10 years.   . COLOSTOMY  05/1979  . COLOSTOMY REVERSAL  11/1979  . EP IMPLANTABLE DEVICE N/A 06/25/2016   Procedure: Lead Revision/Repair;  Surgeon: Danicia Terhaar Meredith Leeds, MD;  Location: [facility] CV LAB;  Service: Cardiovascular;  Laterality: N/A;  . EP IMPLANTABLE DEVICE N/A 06/25/2016   Procedure: Pacemaker Implant;  Surgeon: Zeke Aker Meredith Leeds, MD;  Location: [facility] CV LAB;  Service: Cardiovascular;  Laterality: N/A;  . EXCISIONAL HEMORRHOIDECTOMY  1970's  . EYE SURGERY Left 2000   "branch vein occlusion"  . EYE SURGERY Left ~ 2001   "smoothed out wrinkle"  . LEFT OOPHORECTOMY  05/1979   nicked bowel, peritonitis, colostomy; colostomy reversed 1981   . LOWER EXTREMITY  ANGIOGRAM N/A 01/03/2014   Procedure: LOWER EXTREMITY ANGIOGRAM;  Surgeon: Wellington Hampshire, MD;  Location: [facility] CATH LAB;  Service: Cardiovascular;  Laterality: N/A;  . Nuclear med stress test  10/2011   Small area of mild ischemia inferoapically.  [name] PARTIAL HYSTERECTOMY  1970's   left ovaries, then ovaries removed later due tumors   . RIGHT OOPHORECTOMY  1970's     Current Outpatient Medications  Medication Sig Dispense Refill  . acetaminophen (TYLENOL) 325 MG tablet Take 650 mg by mouth every 6 (six) hours as needed for headache.    [name] amiodarone (PACERONE) 200 MG tablet Take 0.5 tablets (100 mg total) by mouth daily. 45 tablet 1  . apixaban (ELIQUIS) 5 MG TABS tablet Take 1 tablet (5 mg total) by mouth 2 (two) times daily. 28 tablet 0  . atorvastatin (LIPITOR) 80 MG tablet TAKE ONE TABLET BY MOUTH EVERY DAY AT 6:00PM 90 tablet 3  . Cholecalciferol (VITAMIN D) 2000 units tablet Take 2,000 Units by mouth daily.     . clopidogrel (PLAVIX) 75 MG tablet TAKE ONE TABLET (75MG  TOTAL)  BY MOUTH DAILY 90 tablet 3  . diltiazem (CARDIZEM CD) 360 MG 24 hr capsule Take 1 capsule (360 mg total) by mouth daily. 90 capsule 3  . diphenhydramine-acetaminophen (TYLENOL PM EXTRA STRENGTH) 25-500 MG TABS tablet Take 1 tablet by mouth at bedtime as needed (Take as directed).     . furosemide (LASIX) 40 MG tablet TAKE ONE TABLET (40MG  TOTAL) BY MOUTH TWO TIMES DAILY 180 tablet 4  . glucose blood (FREESTYLE LITE) test strip USE AS DIRECTED TWICE DAILY. 100 each 5  . insulin NPH-regular Human (NOVOLIN 70/30) (70-30) 100 UNIT/ML injection Inject 40 Units into the skin See admin instructions. Inject 25 units before breakfast and 25 units before supper  - only if pre-meal blood glucose is above 90 mg/dL. 10 mL 11  . levothyroxine (SYNTHROID) 137 MCG tablet Take 137 mcg by mouth daily before breakfast.    . nitroGLYCERIN (NITROSTAT) 0.4 MG SL tablet Place 1 tablet (0.4 mg total) under the tongue every 5 (five) minutes as  needed for chest pain. 25 tablet 0  . olmesartan (BENICAR) 40 MG tablet Take 40 mg by mouth daily.    . potassium chloride SA (KLOR-CON) 20 MEQ tablet TAKE ONE (1) TABLET BY MOUTH EVERY DAY 90 tablet 1  . rOPINIRole (REQUIP) 0.5 MG tablet Take 0.5 mg by mouth at bedtime.   0  . traZODone (DESYREL) 50 MG tablet Take 0.5-1 tablets (25-50 mg total) by mouth at bedtime as needed for sleep. 90 tablet 1  . carvedilol (COREG) 25 MG tablet Take 1 tablet (25 mg total) by mouth 2 (two) times daily. 90 tablet 1   No current facility-administered medications for this visit.    Allergies:   Penicillins and Percocet [oxycodone-acetaminophen]   Social History:  The patient  reports that she has never smoked. She has never used smokeless tobacco. She reports that she does not drink alcohol or use drugs.   Family History:  The patient's family history includes Diabetes in her brother; Heart disease in her brother, brother, father, mother, and sister; Lupus in her daughter; Thyroid disease in her brother and brother.   ROS:  Please see the history of present illness.   Otherwise, review of systems is positive for none.   All other systems are reviewed and negative.   PHYSICAL EXAM: VS:  BP (!) 150/78   Pulse 83   Ht 5\' 3"  (1.6 m)   Wt 152 lb 9.6 oz (69.2 kg)   SpO2 97%   BMI 27.03 kg/m  , BMI Body mass index is 27.03 kg/m. GEN: Well nourished, well developed, in no acute distress  HEENT: normal  Neck: no JVD, carotid bruits, or masses Cardiac: RRR; no murmurs, rubs, or gallops,no edema  Respiratory:  clear to auscultation bilaterally, normal work of breathing GI: soft, nontender, nondistended, + BS MS: no deformity or atrophy  Skin: warm and dry, device site well healed Neuro:  Strength and sensation are intact Psych: euthymic mood, full affect  EKG:  EKG is ordered today. Personal review of the ekg ordered shows atrial paced, rate 69  Personal review of the device interrogation today. Results  in [place]: 08/24/2018: B Natriuretic Peptide 149.0 07/08/2019: ALT 21; BUN 25; Creatinine, Ser 1.71; Hemoglobin 13.7; Platelets 337; Potassium 4.0; Sodium 140    Lipid Panel     Component Value Date/Time   CHOL 166 08/05/2017 0807   TRIG 160 (H) 08/05/2017 0807   HDL 43 (L) 08/05/2017 8182  CHOLHDL 3.9 08/05/2017 0807   VLDL NOT CALC 04/14/2016 0910   LDLCALC 96 08/05/2017 0807     Wt Readings from Last 3 Encounters:  08/15/19 152 lb 9.6 oz (69.2 kg)  07/08/19 150 lb (68 kg)  02/14/19 143 lb 12.8 oz (65.2 kg)      Other studies Reviewed: Additional studies/ records that were reviewed today include: TTE 08/04/17 Review of the above records today demonstrates:  - Left ventricle: The cavity size was normal. Wall thickness was   increased in a pattern of moderate LVH. Systolic function was   normal. The estimated ejection fraction was in the range of 55%   to 60%. Wall motion was normal; there were no regional wall   motion abnormalities. Features are consistent with a pseudonormal   left ventricular filling pattern, with concomitant abnormal   relaxation and increased filling pressure (grade 2 diastolic   dysfunction). Doppler parameters are consistent with high   ventricular filling pressure. - Left atrium: The atrium was severely dilated. - Right ventricle: Pacer wire or catheter noted in right ventricle. - Right atrium: Pacer wire or catheter noted in right atrium.  Cath  08/02/15  1st RPLB lesion, 50% stenosed.  Dist RCA lesion, 30% stenosed.  Ost 1st Mrg to 1st Mrg lesion, 100% stenosed.  Ost LAD lesion, 40% stenosed.  Mid LAD lesion, 40% stenosed.  Post Atrio lesion, 90% stenosed. Post intervention, there is a 0% residual stenosis.  The left ventricular systolic function is normal.  Mid RCA lesion, 20% stenosed.   Normal LV function without focal segmental wall motion abnormalities and ejection fraction of 55%.  Successful percutaneous  cardiac intervention to the distal RCA treated with Angiosculpt scoring balloon, PTCA, and ultimate stenting with a 2.58 mm Xience Alpine DES stent postdilated to 2.51 mm with a 90% stenosis reduced to 0% and no change in the ostial PDA narrowing.  ASSESSMENT AND PLAN:  1.  Paroxysmal atrial fibrillation with tachybradycardia syndrome: This post Medtronic dual-chamber pacemaker.  Device functioning appropriately.  She is currently on Eliquis and amiodarone.  CHA2DS2-VASc of 7.    This patients CHA2DS2-VASc Score and unadjusted Ischemic Stroke Rate (% per year) is equal to 11.2 % stroke rate/year from a score of 7  Above score calculated as 1 point each if present [CHF, HTN, DM, Vascular=MI/PAD/Aortic Plaque, Age if 65-74, or Female] Above score calculated as 2 points each if present [Age > 75, or Stroke/TIA/TE]   2. Coronary artery disease: No current chest pain.  3. Hypertension: Blood pressure significantly elevated today.  She was in the emergency room and was found to have a UTI but her creatinine was elevated she now has follow-up with nephrology.  Annaliah Rivenbark increase carvedilol to 25 mg.  Current medicines are reviewed at length with the patient today.   The patient does not have concerns regarding her medicines.  The following changes were made today: Increase carvedilol  Labs/ tests ordered today include:  Orders Placed This Encounter  Procedures  . TSH  . EKG 12-Lead     Disposition:   FU with Malone Vanblarcom 6 months  Signed, Byrne Capek Meredith Leeds, MD  08/15/2019 3:20 PM     [facility] [address] [place] [address] [phone] (office) [phone] (fax)

## 2019-08-15 NOTE — Addendum Note (Signed)
Addended by: Marciano Sequin on: 08/15/2019 03:42 PM   Modules accepted: Orders

## 2019-08-15 NOTE — Patient Instructions (Signed)
Medication Instructions:  Your physician has recommended you make the following change in your medication:  1. INCREASE Carvedilol to 25 mg twice a daily  *If you need a refill on your cardiac medications before your next appointment, please call your pharmacy*  Lab Work: Today: TSH If you have labs (blood work) drawn today and your tests are completely normal, you will receive your results only by: [name] MyChart Message (if you have MyChart) OR . A paper copy in the mail If you have any lab test that is abnormal or we need to change your treatment, we will call you to review the results.  Testing/Procedures: None ordered  Follow-Up: At [facility], you and your health needs are our priority.  As part of our continuing mission to provide you with exceptional heart care, we have created designated Provider Care Teams.  These Care Teams include your primary Cardiologist (physician) and Advanced Practice Providers (APPs -  Physician Assistants and Nurse Practitioners) who all work together to provide you with the care you need, when you need it.  Your next appointment:   6 month(s)  The format for your next appointment:   In Person  Provider:   Allegra Lai, MD  Thank you for choosing [facility]!!   Trinidad Curet, RN [phone]

## 2019-08-15 NOTE — Telephone Encounter (Signed)
Spoke to pharmacy. They were asking about Amiodarone & Atorvastatin interaction.  Explained pt has been on both medication for awhile now with no issues.  PharmD isn't sure why it was flagged when she put it in the system, since pt has been on both. They will refill Amiodarone

## 2019-08-16 DIAGNOSIS — I5032 Chronic diastolic (congestive) heart failure: Secondary | ICD-10-CM | POA: Diagnosis not present

## 2019-08-16 DIAGNOSIS — N189 Chronic kidney disease, unspecified: Secondary | ICD-10-CM | POA: Diagnosis not present

## 2019-08-16 DIAGNOSIS — I129 Hypertensive chronic kidney disease with stage 1 through stage 4 chronic kidney disease, or unspecified chronic kidney disease: Secondary | ICD-10-CM | POA: Diagnosis not present

## 2019-08-16 DIAGNOSIS — Z79899 Other long term (current) drug therapy: Secondary | ICD-10-CM | POA: Diagnosis not present

## 2019-08-16 DIAGNOSIS — E559 Vitamin D deficiency, unspecified: Secondary | ICD-10-CM | POA: Diagnosis not present

## 2019-08-16 DIAGNOSIS — R7989 Other specified abnormal findings of blood chemistry: Secondary | ICD-10-CM | POA: Diagnosis not present

## 2019-08-16 DIAGNOSIS — R809 Proteinuria, unspecified: Secondary | ICD-10-CM | POA: Diagnosis not present

## 2019-08-16 DIAGNOSIS — E1129 Type 2 diabetes mellitus with other diabetic kidney complication: Secondary | ICD-10-CM | POA: Diagnosis not present

## 2019-08-16 DIAGNOSIS — E211 Secondary hyperparathyroidism, not elsewhere classified: Secondary | ICD-10-CM | POA: Diagnosis not present

## 2019-08-16 DIAGNOSIS — E1122 Type 2 diabetes mellitus with diabetic chronic kidney disease: Secondary | ICD-10-CM | POA: Diagnosis not present

## 2019-08-16 DIAGNOSIS — N170 Acute kidney failure with tubular necrosis: Secondary | ICD-10-CM | POA: Diagnosis not present

## 2019-08-16 LAB — TSH: TSH: 0.424 u[IU]/mL — ABNORMAL LOW (ref 0.450–4.500)

## 2019-08-18 ENCOUNTER — Telehealth: Payer: Self-pay

## 2019-08-18 NOTE — Telephone Encounter (Signed)
-----   Message from Will Meredith Leeds, MD sent at 08/16/2019  8:29 AM EST ----- TSH low. Patient needs to discuss with PCP.

## 2019-08-18 NOTE — Telephone Encounter (Signed)
The patient has been notified of the result and verbalized understanding.  All questions (if any) were answered. Aware to f/u with PCP/ labs forwarded to PCP Wilma Flavin, RN 08/18/2019 10:47 AM

## 2019-08-20 DIAGNOSIS — I509 Heart failure, unspecified: Secondary | ICD-10-CM | POA: Diagnosis not present

## 2019-08-20 DIAGNOSIS — N184 Chronic kidney disease, stage 4 (severe): Secondary | ICD-10-CM | POA: Diagnosis not present

## 2019-08-20 DIAGNOSIS — E1122 Type 2 diabetes mellitus with diabetic chronic kidney disease: Secondary | ICD-10-CM | POA: Diagnosis not present

## 2019-08-20 DIAGNOSIS — I130 Hypertensive heart and chronic kidney disease with heart failure and stage 1 through stage 4 chronic kidney disease, or unspecified chronic kidney disease: Secondary | ICD-10-CM | POA: Diagnosis not present

## 2019-08-24 ENCOUNTER — Emergency Department (HOSPITAL_COMMUNITY): Payer: PPO

## 2019-08-24 ENCOUNTER — Telehealth: Payer: Self-pay | Admitting: Cardiovascular Disease

## 2019-08-24 ENCOUNTER — Encounter (HOSPITAL_COMMUNITY): Payer: Self-pay | Admitting: Emergency Medicine

## 2019-08-24 ENCOUNTER — Inpatient Hospital Stay (HOSPITAL_COMMUNITY)
Admission: EM | Admit: 2019-08-24 | Discharge: 2019-08-26 | DRG: 291 | Disposition: A | Discharge status: Home / Self Care | Payer: PPO | Attending: Internal Medicine | Admitting: Internal Medicine

## 2019-08-24 ENCOUNTER — Other Ambulatory Visit: Payer: Self-pay

## 2019-08-24 DIAGNOSIS — I482 Chronic atrial fibrillation, unspecified: Secondary | ICD-10-CM | POA: Diagnosis not present

## 2019-08-24 DIAGNOSIS — Z794 Long term (current) use of insulin: Secondary | ICD-10-CM

## 2019-08-24 DIAGNOSIS — I4892 Unspecified atrial flutter: Secondary | ICD-10-CM | POA: Diagnosis not present

## 2019-08-24 DIAGNOSIS — I459 Conduction disorder, unspecified: Secondary | ICD-10-CM | POA: Diagnosis not present

## 2019-08-24 DIAGNOSIS — I5031 Acute diastolic (congestive) heart failure: Secondary | ICD-10-CM | POA: Diagnosis not present

## 2019-08-24 DIAGNOSIS — Z7901 Long term (current) use of anticoagulants: Secondary | ICD-10-CM

## 2019-08-24 DIAGNOSIS — Z950 Presence of cardiac pacemaker: Secondary | ICD-10-CM

## 2019-08-24 DIAGNOSIS — I2510 Atherosclerotic heart disease of native coronary artery without angina pectoris: Secondary | ICD-10-CM | POA: Diagnosis not present

## 2019-08-24 DIAGNOSIS — E1121 Type 2 diabetes mellitus with diabetic nephropathy: Secondary | ICD-10-CM

## 2019-08-24 DIAGNOSIS — I480 Paroxysmal atrial fibrillation: Secondary | ICD-10-CM | POA: Diagnosis present

## 2019-08-24 DIAGNOSIS — J811 Chronic pulmonary edema: Secondary | ICD-10-CM | POA: Diagnosis not present

## 2019-08-24 DIAGNOSIS — I495 Sick sinus syndrome: Secondary | ICD-10-CM | POA: Diagnosis present

## 2019-08-24 DIAGNOSIS — Z87440 Personal history of urinary (tract) infections: Secondary | ICD-10-CM

## 2019-08-24 DIAGNOSIS — M1990 Unspecified osteoarthritis, unspecified site: Secondary | ICD-10-CM | POA: Diagnosis not present

## 2019-08-24 DIAGNOSIS — E785 Hyperlipidemia, unspecified: Secondary | ICD-10-CM | POA: Diagnosis not present

## 2019-08-24 DIAGNOSIS — E11649 Type 2 diabetes mellitus with hypoglycemia without coma: Secondary | ICD-10-CM | POA: Diagnosis present

## 2019-08-24 DIAGNOSIS — N1832 Chronic kidney disease, stage 3b: Secondary | ICD-10-CM | POA: Diagnosis not present

## 2019-08-24 DIAGNOSIS — E1151 Type 2 diabetes mellitus with diabetic peripheral angiopathy without gangrene: Secondary | ICD-10-CM | POA: Diagnosis not present

## 2019-08-24 DIAGNOSIS — Z880 Allergy status to penicillin: Secondary | ICD-10-CM

## 2019-08-24 DIAGNOSIS — I509 Heart failure, unspecified: Secondary | ICD-10-CM

## 2019-08-24 DIAGNOSIS — I130 Hypertensive heart and chronic kidney disease with heart failure and stage 1 through stage 4 chronic kidney disease, or unspecified chronic kidney disease: Secondary | ICD-10-CM | POA: Diagnosis not present

## 2019-08-24 DIAGNOSIS — K219 Gastro-esophageal reflux disease without esophagitis: Secondary | ICD-10-CM | POA: Diagnosis present

## 2019-08-24 DIAGNOSIS — E039 Hypothyroidism, unspecified: Secondary | ICD-10-CM | POA: Diagnosis present

## 2019-08-24 DIAGNOSIS — D519 Vitamin B12 deficiency anemia, unspecified: Secondary | ICD-10-CM | POA: Diagnosis present

## 2019-08-24 DIAGNOSIS — Z79890 Hormone replacement therapy (postmenopausal): Secondary | ICD-10-CM

## 2019-08-24 DIAGNOSIS — Z8249 Family history of ischemic heart disease and other diseases of the circulatory system: Secondary | ICD-10-CM

## 2019-08-24 DIAGNOSIS — Z955 Presence of coronary angioplasty implant and graft: Secondary | ICD-10-CM | POA: Diagnosis not present

## 2019-08-24 DIAGNOSIS — I5033 Acute on chronic diastolic (congestive) heart failure: Secondary | ICD-10-CM | POA: Diagnosis present

## 2019-08-24 DIAGNOSIS — J90 Pleural effusion, not elsewhere classified: Secondary | ICD-10-CM | POA: Diagnosis not present

## 2019-08-24 DIAGNOSIS — N179 Acute kidney failure, unspecified: Secondary | ICD-10-CM | POA: Diagnosis present

## 2019-08-24 DIAGNOSIS — Z20822 Contact with and (suspected) exposure to covid-19: Secondary | ICD-10-CM | POA: Diagnosis present

## 2019-08-24 DIAGNOSIS — Z79899 Other long term (current) drug therapy: Secondary | ICD-10-CM

## 2019-08-24 DIAGNOSIS — Z885 Allergy status to narcotic agent status: Secondary | ICD-10-CM

## 2019-08-24 DIAGNOSIS — I252 Old myocardial infarction: Secondary | ICD-10-CM | POA: Diagnosis not present

## 2019-08-24 DIAGNOSIS — E1122 Type 2 diabetes mellitus with diabetic chronic kidney disease: Secondary | ICD-10-CM | POA: Diagnosis present

## 2019-08-24 DIAGNOSIS — Z8349 Family history of other endocrine, nutritional and metabolic diseases: Secondary | ICD-10-CM

## 2019-08-24 DIAGNOSIS — Z8701 Personal history of pneumonia (recurrent): Secondary | ICD-10-CM

## 2019-08-24 DIAGNOSIS — Z8673 Personal history of transient ischemic attack (TIA), and cerebral infarction without residual deficits: Secondary | ICD-10-CM

## 2019-08-24 DIAGNOSIS — L930 Discoid lupus erythematosus: Secondary | ICD-10-CM | POA: Diagnosis present

## 2019-08-24 DIAGNOSIS — I10 Essential (primary) hypertension: Secondary | ICD-10-CM | POA: Diagnosis not present

## 2019-08-24 DIAGNOSIS — I081 Rheumatic disorders of both mitral and tricuspid valves: Secondary | ICD-10-CM | POA: Diagnosis present

## 2019-08-24 DIAGNOSIS — I110 Hypertensive heart disease with heart failure: Secondary | ICD-10-CM | POA: Diagnosis not present

## 2019-08-24 DIAGNOSIS — Z833 Family history of diabetes mellitus: Secondary | ICD-10-CM

## 2019-08-24 DIAGNOSIS — Z7902 Long term (current) use of antithrombotics/antiplatelets: Secondary | ICD-10-CM

## 2019-08-24 DIAGNOSIS — R0602 Shortness of breath: Secondary | ICD-10-CM | POA: Diagnosis not present

## 2019-08-24 HISTORY — DX: Heart failure, unspecified: I50.9

## 2019-08-24 LAB — CBC
HCT: 35 % — ABNORMAL LOW (ref 36.0–46.0)
Hemoglobin: 11.2 g/dL — ABNORMAL LOW (ref 12.0–15.0)
MCH: 30.4 pg (ref 26.0–34.0)
MCHC: 32 g/dL (ref 30.0–36.0)
MCV: 95.1 fL (ref 80.0–100.0)
Platelets: 286 10*3/uL (ref 150–400)
RBC: 3.68 MIL/uL — ABNORMAL LOW (ref 3.87–5.11)
RDW: 14.8 % (ref 11.5–15.5)
WBC: 10.7 10*3/uL — ABNORMAL HIGH (ref 4.0–10.5)
nRBC: 0 % (ref 0.0–0.2)

## 2019-08-24 LAB — BASIC METABOLIC PANEL
Anion gap: 9 (ref 5–15)
BUN: 23 mg/dL (ref 8–23)
CO2: 25 mmol/L (ref 22–32)
Calcium: 8.9 mg/dL (ref 8.9–10.3)
Chloride: 106 mmol/L (ref 98–111)
Creatinine, Ser: 1.53 mg/dL — ABNORMAL HIGH (ref 0.44–1.00)
GFR calc Af Amer: 38 mL/min — ABNORMAL LOW (ref >60)
GFR calc non Af Amer: 33 mL/min — ABNORMAL LOW (ref >60)
Glucose, Bld: 119 mg/dL — ABNORMAL HIGH (ref 70–99)
Potassium: 3.3 mmol/L — ABNORMAL LOW (ref 3.5–5.1)
Sodium: 140 mmol/L (ref 135–145)

## 2019-08-24 LAB — CBG MONITORING, ED: Glucose-Capillary: 62 mg/dL — ABNORMAL LOW (ref 70–99)

## 2019-08-24 LAB — BRAIN NATRIURETIC PEPTIDE: B Natriuretic Peptide: 547 pg/mL — ABNORMAL HIGH (ref 0.0–100.0)

## 2019-08-24 LAB — TROPONIN I (HIGH SENSITIVITY): Troponin I (High Sensitivity): 19 ng/L — ABNORMAL HIGH (ref <18)

## 2019-08-24 LAB — POC SARS CORONAVIRUS 2 AG -  ED: SARS Coronavirus 2 Ag: NEGATIVE

## 2019-08-24 MED ORDER — POTASSIUM CHLORIDE CRYS ER 20 MEQ PO TBCR
40.0000 meq | EXTENDED_RELEASE_TABLET | Freq: Once | ORAL | Status: AC
  Administered 2019-08-25: 40 meq via ORAL
  Filled 2019-08-24 (×2): qty 2

## 2019-08-24 MED ORDER — FUROSEMIDE 10 MG/ML IJ SOLN
80.0000 mg | Freq: Once | INTRAMUSCULAR | Status: AC
  Administered 2019-08-24: 80 mg via INTRAVENOUS
  Filled 2019-08-24: qty 8

## 2019-08-24 NOTE — Telephone Encounter (Signed)
If O2 sats are in the 80s she will need a chest x-ray.  Has she been weighing herself daily? I would recommend she go to the ED for further evaluation.  She may need IV Lasix.

## 2019-08-24 NOTE — ED Provider Notes (Signed)
Virginia Huber   Virginia Huber Arrival date & time: 08/24/19  1808     History Chief Complaint  Patient presents with  . Shortness of Breath    Virginia Huber is a 74 y.o. female history of coronary artery disease, congestive heart failure on Lasix 40 mg twice daily, paroxysmal A. fib on Eliquis and Plavix, s/p pacemaker implant for tachy-brady syndrome, hypertension, high cholesterol, presented to the emergency department shortness of breath.  She reports that her symptoms began approximately 3 days ago when she woke up.  She felt like she was short of breath, particularly with exertion.  She also feels like she has worsening shortness of breath when laying flat.  Said her home nurse noted that her O2 sats were dropping in the daytime, as low as 72%.  She has no history of COPD smoking or asthma.  She is concerned she may be having congestive heart failure flareup.  She has been compliant with her Lasix.  She has not missed any of her medications.  She says that this time of the year every year she comes in with a CHF exacerbation.  She says in the past it has been due to pneumonia.  However she currently has no cough, congestion, or fevers.  She denies any chest pain.  She does report that her legs have been swelling somewhat.  She reports a 5 pound weight gain in the past 3 days  HPI     Past Medical History:  Diagnosis Date  . Arthritis   . Atrial fibrillation and flutter ([place])    a. h/o PAF/flutter during admission in 2013 for PNA. b. PAF during adm for NSTEMI 07/2015, subsequent paroxysms since then.  . B12 deficiency anemia   . Cardiac tamponade 06/2016  . CHF (congestive heart failure) ([place])   . Coronary artery disease 11/30/2014   a. remote MI. b. h/o PTCA with scoring balloon to OM1 11/2014. c. NSTEMI 03/2015 s/p DES to prox-mid Cx. d. NSTEMI 07/2015 s/p scoring balloon/PTCA/DES to dRCA with PAF during that admission  . Cutaneous lupus  erythematosus   . GERD (gastroesophageal reflux disease)   . Heart block   . History of blood transfusion 1980's   2nd surgical procedures  . HTN (hypertension)   . Hypercholesteremia   . Hypothyroidism   . Myocardial infarction ([place]) 02/2012  . Ovarian tumor   . PAD (peripheral artery disease) ([place])    a. s/p LE angio 2015; followed by Dr. Fletcher Anon - managed medically.  . Pain with urination 05/08/2015  . Paroxysmal atrial flutter ([place])   . Pericardial effusion    a. 06/2016 after ppm - s/p pericardiocentesis.  . Superficial fungus infection of skin 06/29/2013  . Tachy-brady syndrome ([place])    a. s/p Medtr[employer] PPM 06/2016, c/b lead perf/pericardial effusion.  [name] TIA (transient ischemic attack) 08/2001; ~ 2006  . Type II diabetes mellitus ([place])   . UTI (urinary tract infection) 05/08/2013    Patient Active Problem List   Diagnosis Date Noted  . Acute exacerbation of CHF (congestive heart failure) ([place]) 08/24/2019  . Acute on chronic diastolic HF (heart failure) ([place]) 12/17/2017  . GERD (gastroesophageal reflux disease) 12/17/2017  . Typical atrial flutter ([place])   . 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 Paroxysmal atrial fibrillation ([place]) 12/08/2017  . 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 CKD (chronic kidney disease) stage 3, GFR 30-59 ml/min 09/23/2017  . Fever 09/23/2017  .  S/P pericardiocentesis 06/28/2016  . Acute blood loss anemia 06/28/2016  . Pericardial effusion 06/26/2016  . Tachy-brady syndrome ([place]) 06/25/2016  . Tamponade   . Bradycardia 06/14/2016  . Junctional bradycardia   . Coronary artery disease involving coronary bypass graft of native heart with unstable angina pectoris ([place])   . Acute diastolic CHF (congestive heart failure), NYHA class 3 ([place])   . Systolic congestive heart failure ([place]) 05/13/2016  . Multiple and bilateral precerebral artery syndromes 05/13/2016  . OSA (obstructive sleep apnea) 05/13/2016  .  Chronic diarrhea 12/25/2015  . Chest pain 08/02/2015  . Atrial fibrillation with rapid ventricular response ([place])   . Pain with urination 05/08/2015  . NSTEMI (non-ST elevated myocardial infarction) ([place]) 04/02/2015  . CAD in native artery 11/30/2014  . 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   . PAD (peripheral artery disease) ([place]) 12/26/2013  . Superficial fungus infection of skin 06/29/2013  . UTI (urinary tract infection) 05/08/2013  . Hypokalemia 03/05/2012  . B12 deficiency anemia 03/02/2012  . Bronchospasm 03/02/2012  . Community acquired bacterial pneumonia 03/01/2012  . Acute respiratory failure with hypoxia ([place]) 03/01/2012  . DM type 2 causing vascular disease ([place]) 02/29/2012  . Hypothyroidism 02/28/2012  . RLQ abdominal pain 11/24/2010  . OVERWEIGHT/OBESITY 06/03/2010  . Essential hypertension 06/03/2010  . Mixed hyperlipidemia 12/27/2009  . Palpitations 05/17/2009  . FRACTURE, TOE 12/06/2007    Past Surgical History:  Procedure Laterality Date  . ABDOMINAL AORTAGRAM N/A 01/03/2014   Procedure: ABDOMINAL [name];  Surgeon: Wellington Hampshire, MD;  Location: [facility];  Service: Cardiovascular;  Laterality: N/A;  . ABDOMINAL HYSTERECTOMY  1972   "partial"  . APPENDECTOMY  1970's  . CARDIAC CATHETERIZATION  2008   Tiny OM-2 with 90% narrowing. Med tx.  [name] CARDIAC CATHETERIZATION N/A 11/30/2014   Procedure: Left Heart Cath and Coronary Angiography;  Surgeon: Troy Sine, MD; LAD 20%, CFX 50%, OM1 95%, right PLB 30%, LV normal   . CARDIAC CATHETERIZATION N/A 11/30/2014   Procedure: Coronary Balloon Angioplasty;  Surgeon: Troy Sine, MD;  Angiosculpt scoring balloon and PTCA to the OM1 reducing stenosis from 95% to less than 10%  . CARDIAC CATHETERIZATION N/A 04/03/2015   Procedure: Left Heart Cath and Coronary Angiography;  Surgeon: Jolaine Artist, MD; dLAD 50%, CFX 90%, OM1 100%, PLA 15%, LVEDP 13    .  CARDIAC CATHETERIZATION N/A 04/03/2015   Procedure: Coronary Stent Intervention;  Surgeon: Sherren Mocha, MD; 3.0x18 mm Xience DES to the CFX    . CARDIAC CATHETERIZATION N/A 08/02/2015   Procedure: Left Heart Cath and Coronary Angiography;  Surgeon: Troy Sine, MD;  Location: [facility];  Service: Cardiovascular;  Laterality: N/A;  . CARDIAC CATHETERIZATION N/A 08/02/2015   Procedure: Coronary Stent Intervention;  Surgeon: Troy Sine, MD;  Location: [facility];  Service: Cardiovascular;  Laterality: N/A;  . CARDIAC CATHETERIZATION N/A 06/25/2016   Procedure: Pericardiocentesis;  Surgeon: Will Meredith Leeds, MD;  Location: [facility];  Service: Cardiovascular;  Laterality: N/A;  . cardiac stents    . CARDIOVERSION N/A 12/15/2017   Procedure: CARDIOVERSION;  Surgeon: Acie Fredrickson Wonda Cheng, MD;  Location: [facility] ENDOSCOPY;  Service: Cardiovascular;  Laterality: N/A;  . CHOLECYSTECTOMY OPEN  1990's  . COLONOSCOPY  2005   Dr. Laural Golden: pancolonic divericula, polyp, path unknown currently  . COLONOSCOPY  2012   Dr. Oneida Alar: Normal TI, scattered diverticula in entire colon, small internal  hemorrhoids, normal colon biopsies. Colonoscopy in 5-10 years.   . COLOSTOMY  05/1979  . COLOSTOMY REVERSAL  11/1979  . EP IMPLANTABLE DEVICE N/A 06/25/2016   Procedure: Lead Revision/Repair;  Surgeon: Will Meredith Leeds, MD;  Location: [facility];  Service: Cardiovascular;  Laterality: N/A;  . EP IMPLANTABLE DEVICE N/A 06/25/2016   Procedure: Pacemaker Implant;  Surgeon: Will Meredith Leeds, MD;  Location: [facility];  Service: Cardiovascular;  Laterality: N/A;  . EXCISIONAL HEMORRHOIDECTOMY  1970's  . EYE SURGERY Left 2000   "branch vein occlusion"  . EYE SURGERY Left ~ 2001   "smoothed out wrinkle"  . LEFT OOPHORECTOMY  05/1979   nicked bowel, peritonitis, colostomy; colostomy reversed 1981   . LOWER EXTREMITY ANGIOGRAM N/A 01/03/2014   Procedure: LOWER EXTREMITY ANGIOGRAM;   Surgeon: Wellington Hampshire, MD;  Location: [facility];  Service: Cardiovascular;  Laterality: N/A;  . Nuclear med stress test  10/2011   Small area of mild ischemia inferoapically.  [name] PARTIAL HYSTERECTOMY  1970's   left ovaries, then ovaries removed later due tumors   . RIGHT OOPHORECTOMY  1970's     OB History    Gravida  3   Para  2   Term      Preterm      AB  1   Living  2     SAB  1   TAB      Ectopic      Multiple      Live Births  2           Family History  Problem Relation Age of Onset  . Heart disease Mother        deceased  . Heart disease Father        deceased, heart disease  . Diabetes Brother   . Heart disease Brother   . Thyroid disease Brother   . Heart disease Sister   . Heart disease Brother   . Thyroid disease Brother   . Lupus Daughter   . Colon cancer Neg Hx     Social History   Tobacco Use  . Smoking status: Never Smoker  . Smokeless tobacco: Never Used  . Tobacco comment: Never smoked  Substance Use Topics  . Alcohol use: No    Alcohol/week: 0.0 standard drinks  . Drug use: No    Home Medications Prior to Admission medications   Medication Sig Start Date End Date Taking? Authorizing Provider  acetaminophen (TYLENOL) 325 MG tablet Take 650 mg by mouth every 6 (six) hours as needed for headache.   Yes [provider]  amiodarone (PACERONE) 200 MG tablet Take 0.5 tablets (100 mg total) by mouth daily. 08/15/19  Yes Camnitz, Ocie Doyne, MD  apixaban (ELIQUIS) 5 MG TABS tablet Take 1 tablet (5 mg total) by mouth 2 (two) times daily. 01/17/19  Yes Herminio Commons, MD  atorvastatin (LIPITOR) 80 MG tablet TAKE ONE TABLET BY MOUTH EVERY DAY AT 6:00PM 10/06/18  Yes Herminio Commons, MD  carvedilol (COREG) 25 MG tablet Take 1 tablet (25 mg total) by mouth 2 (two) times daily. 08/15/19  Yes Camnitz, Will Hassell Done, MD  clopidogrel (PLAVIX) 75 MG tablet TAKE ONE TABLET (75MG  TOTAL) BY MOUTH DAILY 03/20/19  Yes Herminio Commons, MD  cyanocobalamin (,VITAMIN B-12,) 1000 MCG/ML injection Inject 1,000 mcg into the muscle every 30 (thirty) days. Last injection was around 08/02/19   Yes [provider]  diltiazem (CARDIZEM CD)  360 MG 24 hr capsule Take 1 capsule (360 mg total) by mouth daily. 01/25/19  Yes Herminio Commons, MD  furosemide (LASIX) 40 MG tablet TAKE ONE TABLET (40MG  TOTAL) BY MOUTH TWO TIMES DAILY 07/05/18  Yes Herminio Commons, MD  insulin NPH-regular Human (NOVOLIN 70/30) (70-30) 100 UNIT/ML injection Inject 40 Units into the skin See admin instructions. Inject 25 units before breakfast and 25 units before supper  - only if pre-meal blood glucose is above 90 mg/dL. Patient taking differently: Inject 40 Units into the skin See admin instructions. Inject 30 units before breakfast and 30 units before supper  - only if pre-meal blood glucose is above 90 mg/dL. 09/30/17  Yes Kathie Dike, MD  levothyroxine (SYNTHROID) 137 MCG tablet Take 137 mcg by mouth daily before breakfast.   Yes [provider]  nitroGLYCERIN (NITROSTAT) 0.4 MG SL tablet Place 1 tablet (0.4 mg total) under the tongue every 5 (five) minutes as needed for chest pain. 11/25/17  Yes Rolland Porter, MD  olmesartan (BENICAR) 40 MG tablet Take 40 mg by mouth daily. 06/17/19  Yes [provider]  potassium chloride SA (KLOR-CON) 20 MEQ tablet TAKE ONE (1) TABLET BY MOUTH EVERY DAY 05/22/19  Yes Herminio Commons, MD  rOPINIRole (REQUIP) 0.5 MG tablet Take 0.5 mg by mouth at bedtime.  03/05/14  Yes [provider]  Vitamin D, Ergocalciferol, (DRISDOL) 1.25 MG (50000 UNIT) CAPS capsule Take 50,000 Units by mouth every 7 (seven) days. On tuesdays   Yes [provider]  glucose blood (FREESTYLE LITE) test strip USE AS DIRECTED TWICE DAILY. Patient not taking: Reported on 08/25/2019 05/05/17   Cassandria Anger, MD  traZODone (DESYREL) 50 MG tablet Take 0.5-1 tablets (25-50 mg total) by mouth at bedtime  as needed for sleep. Patient not taking: Reported on 08/25/2019 02/08/19   Dohmeier, Asencion Partridge, MD    Allergies    Penicillins and Percocet [oxycodone-acetaminophen]  Review of Systems   Review of Systems  Constitutional: Negative for chills and fever.  Respiratory: Positive for shortness of breath. Negative for cough.   Cardiovascular: Positive for leg swelling. Negative for chest pain and palpitations.  Gastrointestinal: Negative for abdominal pain and vomiting.  Genitourinary: Negative for dysuria and hematuria.  Musculoskeletal: Negative for arthralgias and back pain.  Neurological: Negative for syncope and headaches.  All other systems reviewed and are negative.   Physical Exam Updated Vital Signs BP 118/63   Pulse (!) 104   Temp 98.6 F (37 C) (Oral)   Resp 16   Ht 5\' 3"  (1.6 m)   Wt 69.2 kg   SpO2 92%   BMI 27.03 kg/m   Physical Exam Vitals and nursing Huber reviewed.  Constitutional:      General: She is not in acute distress.    Appearance: She is well-developed.  HENT:     Head: Normocephalic and atraumatic.  Eyes:     Conjunctiva/sclera: Conjunctivae normal.  Cardiovascular:     Rate and Rhythm: Normal rate and regular rhythm.  Pulmonary:     Effort: Pulmonary effort is normal.     Comments: Desaturation to mid 80's with movement, otherwise maintains sat at 92-95% while sitting at rest on bed Speaking in full sentences Crackles in bilateral lower lung fields Abdominal:     Palpations: Abdomen is soft.     Tenderness: There is no abdominal tenderness.  Musculoskeletal:     Cervical back: Neck supple.     Right lower leg: Edema present.  Left lower leg: Edema present.  Skin:    General: Skin is warm and dry.  Neurological:     Mental Status: She is alert.     ED Results / Procedures / Treatments   Labs (all labs ordered are listed, but only abnormal results are displayed) Labs Reviewed  BASIC METABOLIC PANEL - Abnormal; Notable for the following  components:      Result Value   Potassium 3.3 (*)    Glucose, Bld 119 (*)    Creatinine, Ser 1.53 (*)    GFR calc non Af Amer 33 (*)    GFR calc Af Amer 38 (*)    All other components within normal limits  CBC - Abnormal; Notable for the following components:   WBC 10.7 (*)    RBC 3.68 (*)    Hemoglobin 11.2 (*)    HCT 35.0 (*)    All other components within normal limits  BRAIN NATRIURETIC PEPTIDE - Abnormal; Notable for the following components:   B Natriuretic Peptide 547.0 (*)    All other components within normal limits  CBC - Abnormal; Notable for the following components:   RBC 3.25 (*)    Hemoglobin 9.8 (*)    HCT 31.0 (*)    All other components within normal limits  COMPREHENSIVE METABOLIC PANEL - Abnormal; Notable for the following components:   Glucose, Bld 131 (*)    BUN 24 (*)    Creatinine, Ser 1.40 (*)    Calcium 8.3 (*)    Total Protein 5.7 (*)    Albumin 3.0 (*)    AST 13 (*)    GFR calc non Af Amer 37 (*)    GFR calc Af Amer 43 (*)    All other components within normal limits  HEMOGLOBIN A1C - Abnormal; Notable for the following components:   Hgb A1c MFr Bld 7.0 (*)    All other components within normal limits  GLUCOSE, CAPILLARY - Abnormal; Notable for the following components:   Glucose-Capillary 102 (*)    All other components within normal limits  GLUCOSE, CAPILLARY - Abnormal; Notable for the following components:   Glucose-Capillary 126 (*)    All other components within normal limits  CBG MONITORING, ED - Abnormal; Notable for the following components:   Glucose-Capillary 62 (*)    All other components within normal limits  TROPONIN I (HIGH SENSITIVITY) - Abnormal; Notable for the following components:   Troponin I (High Sensitivity) 19 (*)    All other components within normal limits  SARS CORONAVIRUS 2 (TAT 6-24 HRS)  POC SARS CORONAVIRUS 2 AG -  ED    EKG EKG Interpretation  Date/Time:  Thursday August 24 2019 18:26:34  EST Ventricular Rate:  62 PR Interval:  264 QRS Duration: 98 QT Interval:  460 QTC Calculation: 466 R Axis:   -43 Text Interpretation: Atrial-paced rhythm with prolonged AV conduction Left axis deviation Left ventricular hypertrophy with repolarization abnormality ( Cornell product ) Cannot rule out Septal infarct , age undetermined Abnormal ECG No STEMI Confirmed by Octaviano Glow [id number]) on 08/24/2019 6:34:07 PM   Radiology DG Chest 2 View  Result Date: 08/24/2019 CLINICAL DATA:  Dyspnea. EXAM: CHEST - 2 VIEW COMPARISON:  August 24, 2018 FINDINGS: There is stable mild cardiomegaly and atrioventricular cardiac pacemaker with left-sided battery pack. Mild pulmonary edema and trace bilateral pleural effusions have developed. A small amount of pleural fluid layers along the minor fissure. Streaky consolidation in the medial lung bases is likely  atelectatic. There are moderate skeletal degenerative changes and upper abdominal surgical clips and diffuse bone demineralization. IMPRESSION: Stable mild cardiomegaly and pacemaker with mild pulmonary edema and bibasilar trace pleural effusion and compressive atelectasis. Electronically Signed   By: Revonda Humphrey   On: 08/24/2019 19:27    Procedures Procedures (including critical care time)  Medications Ordered in ED Medications  acetaminophen (TYLENOL) tablet 650 mg (has no administration in time range)  amiodarone (PACERONE) tablet 100 mg (100 mg Oral Given 08/25/19 0852)  atorvastatin (LIPITOR) tablet 80 mg (has no administration in time range)  carvedilol (COREG) tablet 25 mg (25 mg Oral Given 08/25/19 0856)  diltiazem (CARDIZEM CD) 24 hr capsule 360 mg (360 mg Oral Given 08/25/19 0853)  furosemide (LASIX) tablet 40 mg (40 mg Oral Given 08/25/19 0849)  irbesartan (AVAPRO) tablet 300 mg (300 mg Oral Given 08/25/19 0851)  insulin aspart protamine- aspart (NOVOLOG MIX 70/30) injection 30 Units (30 Units Subcutaneous Given 08/25/19 0848)  levothyroxine  (SYNTHROID) tablet 137 mcg (137 mcg Oral Given 08/25/19 0546)  apixaban (ELIQUIS) tablet 5 mg (5 mg Oral Given 08/25/19 0851)  clopidogrel (PLAVIX) tablet 75 mg (75 mg Oral Given 08/25/19 0849)  potassium chloride SA (KLOR-CON) CR tablet 20 mEq (20 mEq Oral Given 08/25/19 0850)  rOPINIRole (REQUIP) tablet 0.5 mg (0.5 mg Oral Given 08/25/19 0059)  sodium chloride flush (NS) 0.9 % injection 3 mL (3 mLs Intravenous Given 08/25/19 0854)  sodium chloride flush (NS) 0.9 % injection 3 mL (has no administration in time range)  0.9 %  sodium chloride infusion (has no administration in time range)  ondansetron (ZOFRAN) tablet 4 mg (has no administration in time range)    Or  ondansetron (ZOFRAN) injection 4 mg (has no administration in time range)  insulin aspart (novoLOG) injection 0-9 Units (1 Units Subcutaneous Given 08/25/19 1209)  furosemide (LASIX) injection 80 mg (80 mg Intravenous Given 08/24/19 2046)  potassium chloride SA (KLOR-CON) CR tablet 40 mEq (40 mEq Oral Given 08/25/19 0102)    ED Course  I have reviewed the triage vital signs and the nursing notes.  Pertinent labs & imaging results that were available during my care of the patient were reviewed by me and considered in my medical decision making (see chart for details).  74 yo female presenting to ED with suspected CHF exacerbation.  Crackles on pulm exam, and some symmetrical LE edema.  She desaturates with movement, but otherwise is stable on room air.  She sounds wet.  She'll need IV diuresis and admission for echocardiogram.  It's not clear what the inciting event is - she has been compliant with all of her meds.  Her arrythmia appears controlled with A-paced rhythm.  With no chest pain, and trop of only 19, I doubt this is ACS.   PE seems less likely as she is on eliquis and plavix.    She is agreeable to staying for further management.  Clinical Course as of Aug 25 1227  Thu Aug 24, 2019  2148 B Natriuretic Peptide(!): 547.0 [MT]  2149 Cr  near baseline   [MT]  2212 Signed out to hospitalist   [MT]    Clinical Course User Index [MT] Langston Masker, Carola Rhine, MD    Final Clinical Impression(s) / ED Diagnoses Final diagnoses:  Acute on chronic congestive heart failure, unspecified heart failure type [facility])    Rx / DC Orders ED Discharge Orders    None       Abundio Teuscher, Carola Rhine, MD 08/25/19  1229  

## 2019-08-24 NOTE — Telephone Encounter (Signed)
Corene Cornea from home health called. Informed him of Dr. Court Joy recommendations. He stated he will get in touch with pt and advise her.

## 2019-08-24 NOTE — H&P (Signed)
TRH H&P    Patient Demographics:    Virginia Huber, is a 74 y.o. female  MRN: [id number]  DOB - February 04, 1946  Admit Date - 08/24/2019  Referring MD/NP/PA: Octaviano Glow  Outpatient Primary MD for the patient is Sharilyn Sites, MD  Patient coming from: Home  Chief complaint-shortness of breath   HPI:    Virginia Huber  is a 74 y.o. female, with history of diastolic heart failure, paroxysmal atrial fibrillation on Eliquis, CAD on Plavix, s/p pacemaker implant for tachybradycardia syndrome, hypertension, hyperlipidemia came to ED with complaints of shortness of breath.  Patient states that symptoms began 3 days ago when she becomes short of breath on exertion.  She checked her O2 sats at home and was dropped to 72%.  Patient has no history of COPD stable.  She has been taking her medications.  Today she took extra dose of Lasix as recommended by her physical therapist. She denies fever or chills. Denies nausea vomiting or diarrhea. Denies abdominal pain or dysuria.    Review of systems:    In addition to the HPI above,    All other systems reviewed and are negative.    Past History of the following :    Past Medical History:  Diagnosis Date  . Arthritis   . Atrial fibrillation and flutter ([place])    a. h/o PAF/flutter during admission in 2013 for PNA. b. PAF during adm for NSTEMI 07/2015, subsequent paroxysms since then.  . B12 deficiency anemia   . Cardiac tamponade 06/2016  . CHF (congestive heart failure) ([place])   . Coronary artery disease 11/30/2014   a. remote MI. b. h/o PTCA with scoring balloon to OM1 11/2014. c. NSTEMI 03/2015 s/p DES to prox-mid Cx. d. NSTEMI 07/2015 s/p scoring balloon/PTCA/DES to dRCA with PAF during that admission  . Cutaneous lupus erythematosus   . GERD (gastroesophageal reflux disease)   . Heart block   . History of blood transfusion 1980's   2nd surgical procedures  . HTN  (hypertension)   . Hypercholesteremia   . Hypothyroidism   . Myocardial infarction ([place]) 02/2012  . Ovarian tumor   . PAD (peripheral artery disease) ([place])    a. s/p LE angio 2015; followed by Dr. Fletcher Anon - managed medically.  . Pain with urination 05/08/2015  . Paroxysmal atrial flutter ([place])   . Pericardial effusion    a. 06/2016 after ppm - s/p pericardiocentesis.  . Superficial fungus infection of skin 06/29/2013  . Tachy-brady syndrome ([place])    a. s/p Medtronic PPM 06/2016, c/b lead perf/pericardial effusion.  [name] TIA (transient ischemic attack) 08/2001; ~ 2006  . Type II diabetes mellitus ([place])   . UTI (urinary tract infection) 05/08/2013      Past Surgical History:  Procedure Laterality Date  . ABDOMINAL AORTAGRAM N/A 01/03/2014   Procedure: ABDOMINAL [name];  Surgeon: Wellington Hampshire, MD;  Location: [facility];  Service: Cardiovascular;  Laterality: N/A;  . ABDOMINAL HYSTERECTOMY  1972   "partial"  . APPENDECTOMY  1970's  . CARDIAC CATHETERIZATION  2008  Tiny OM-2 with 90% narrowing. Med tx.  [name] CARDIAC CATHETERIZATION N/A 11/30/2014   Procedure: Left Heart Cath and Coronary Angiography;  Surgeon: Troy Sine, MD; LAD 20%, CFX 50%, OM1 95%, right PLB 30%, LV normal   . CARDIAC CATHETERIZATION N/A 11/30/2014   Procedure: Coronary Balloon Angioplasty;  Surgeon: Troy Sine, MD;  Angiosculpt scoring balloon and PTCA to the OM1 reducing stenosis from 95% to less than 10%  . CARDIAC CATHETERIZATION N/A 04/03/2015   Procedure: Left Heart Cath and Coronary Angiography;  Surgeon: Jolaine Artist, MD; dLAD 50%, CFX 90%, OM1 100%, PLA 15%, LVEDP 13    . CARDIAC CATHETERIZATION N/A 04/03/2015   Procedure: Coronary Stent Intervention;  Surgeon: Sherren Mocha, MD; 3.0x18 mm Xience DES to the CFX    . CARDIAC CATHETERIZATION N/A 08/02/2015   Procedure: Left Heart Cath and Coronary Angiography;  Surgeon: Troy Sine, MD;  Location: [facility];  Service: Cardiovascular;   Laterality: N/A;  . CARDIAC CATHETERIZATION N/A 08/02/2015   Procedure: Coronary Stent Intervention;  Surgeon: Troy Sine, MD;  Location: [facility];  Service: Cardiovascular;  Laterality: N/A;  . CARDIAC CATHETERIZATION N/A 06/25/2016   Procedure: Pericardiocentesis;  Surgeon: Will Meredith Leeds, MD;  Location: [facility];  Service: Cardiovascular;  Laterality: N/A;  . cardiac stents    . CARDIOVERSION N/A 12/15/2017   Procedure: CARDIOVERSION;  Surgeon: Acie Fredrickson Wonda Cheng, MD;  Location: [facility] ENDOSCOPY;  Service: Cardiovascular;  Laterality: N/A;  . CHOLECYSTECTOMY OPEN  1990's  . COLONOSCOPY  2005   Dr. Laural Golden: pancolonic divericula, polyp, path unknown currently  . COLONOSCOPY  2012   Dr. Oneida Alar: Normal TI, scattered diverticula in entire colon, small internal hemorrhoids, normal colon biopsies. Colonoscopy in 5-10 years.   . COLOSTOMY  05/1979  . COLOSTOMY REVERSAL  11/1979  . EP IMPLANTABLE DEVICE N/A 06/25/2016   Procedure: Lead Revision/Repair;  Surgeon: Will Meredith Leeds, MD;  Location: [facility];  Service: Cardiovascular;  Laterality: N/A;  . EP IMPLANTABLE DEVICE N/A 06/25/2016   Procedure: Pacemaker Implant;  Surgeon: Will Meredith Leeds, MD;  Location: [facility];  Service: Cardiovascular;  Laterality: N/A;  . EXCISIONAL HEMORRHOIDECTOMY  1970's  . EYE SURGERY Left 2000   "branch vein occlusion"  . EYE SURGERY Left ~ 2001   "smoothed out wrinkle"  . LEFT OOPHORECTOMY  05/1979   nicked bowel, peritonitis, colostomy; colostomy reversed 1981   . LOWER EXTREMITY ANGIOGRAM N/A 01/03/2014   Procedure: LOWER EXTREMITY ANGIOGRAM;  Surgeon: Wellington Hampshire, MD;  Location: [facility];  Service: Cardiovascular;  Laterality: N/A;  . Nuclear med stress test  10/2011   Small area of mild ischemia inferoapically.  [name] PARTIAL HYSTERECTOMY  1970's   left ovaries, then ovaries removed later due tumors   . RIGHT OOPHORECTOMY  1970's      Social History:        Social History   Tobacco Use  . Smoking status: Never Smoker  . Smokeless tobacco: Never Used  . Tobacco comment: Never smoked  Substance Use Topics  . Alcohol use: No    Alcohol/week: 0.0 standard drinks       Family History :     Family History  Problem Relation Age of Onset  . Heart disease Mother        deceased  . Heart disease Father        deceased, heart disease  . Diabetes Brother   .  Heart disease Brother   . Thyroid disease Brother   . Heart disease Sister   . Heart disease Brother   . Thyroid disease Brother   . Lupus Daughter   . Colon cancer Neg Hx       Home Medications:   Prior to Admission medications   Medication Sig Start Date End Date Taking? Authorizing Provider  acetaminophen (TYLENOL) 325 MG tablet Take 650 mg by mouth every 6 (six) hours as needed for headache.    [provider]  amiodarone (PACERONE) 200 MG tablet Take 0.5 tablets (100 mg total) by mouth daily. 08/15/19   Camnitz, Ocie Doyne, MD  apixaban (ELIQUIS) 5 MG TABS tablet Take 1 tablet (5 mg total) by mouth 2 (two) times daily. 01/17/19   Herminio Commons, MD  atorvastatin (LIPITOR) 80 MG tablet TAKE ONE TABLET BY MOUTH EVERY DAY AT 6:00PM 10/06/18   Herminio Commons, MD  carvedilol (COREG) 25 MG tablet Take 1 tablet (25 mg total) by mouth 2 (two) times daily. 08/15/19   Camnitz, Ocie Doyne, MD  Cholecalciferol (VITAMIN D) 2000 units tablet Take 2,000 Units by mouth daily.     [provider]  clopidogrel (PLAVIX) 75 MG tablet TAKE ONE TABLET (75MG TOTAL) BY MOUTH DAILY 03/20/19   Herminio Commons, MD  diltiazem (CARDIZEM CD) 360 MG 24 hr capsule Take 1 capsule (360 mg total) by mouth daily. 01/25/19   Herminio Commons, MD  diphenhydramine-acetaminophen (TYLENOL PM EXTRA STRENGTH) 25-500 MG TABS tablet Take 1 tablet by mouth at bedtime as needed (Take as directed).     [provider]  furosemide (LASIX) 40 MG tablet TAKE ONE TABLET (40MG TOTAL)  BY MOUTH TWO TIMES DAILY 07/05/18   Herminio Commons, MD  glucose blood (FREESTYLE LITE) test strip USE AS DIRECTED TWICE DAILY. 05/05/17   Cassandria Anger, MD  insulin NPH-regular Human (NOVOLIN 70/30) (70-30) 100 UNIT/ML injection Inject 40 Units into the skin See admin instructions. Inject 25 units before breakfast and 25 units before supper  - only if pre-meal blood glucose is above 90 mg/dL. 09/30/17   Kathie Dike, MD  levothyroxine (SYNTHROID) 137 MCG tablet Take 137 mcg by mouth daily before breakfast.    [provider]  nitroGLYCERIN (NITROSTAT) 0.4 MG SL tablet Place 1 tablet (0.4 mg total) under the tongue every 5 (five) minutes as needed for chest pain. 11/25/17   Rolland Porter, MD  olmesartan (BENICAR) 40 MG tablet Take 40 mg by mouth daily. 06/17/19   [provider]  potassium chloride SA (KLOR-CON) 20 MEQ tablet TAKE ONE (1) TABLET BY MOUTH EVERY DAY 05/22/19   Herminio Commons, MD  rOPINIRole (REQUIP) 0.5 MG tablet Take 0.5 mg by mouth at bedtime.  03/05/14   [provider]  traZODone (DESYREL) 50 MG tablet Take 0.5-1 tablets (25-50 mg total) by mouth at bedtime as needed for sleep. 02/08/19   Dohmeier, Asencion Partridge, MD     Allergies:     Allergies  Allergen Reactions  . Penicillins Hives    Has patient had a PCN reaction causing immediate rash, facial/tongue/throat swelling, SOB or lightheadedness with hypotension: Yes Has patient had a PCN reaction causing severe rash involving mucus membranes or skin necrosis: No Has patient had a PCN reaction that required hospitalization No Has patient had a PCN reaction occurring within the last 10 years: No If all of the above answers are "NO", then may proceed with Cephalosporin use.  [name] Percocet [Oxycodone-Acetaminophen]  Nausea And Vomiting     Physical Exam:   Vitals  Blood pressure (!) 149/50, pulse 81, temperature 98.2 F (36.8 C), temperature source Oral, resp. rate 17, height 5' 3" (1.6 m),  weight 69.2 kg, SpO2 96 %.  1.  General: Appears in no acute distress  2. Psychiatric: Alert, oriented x3, intact insight and judgment  3. Neurologic: Cranial nerves II through XII grossly intact, no focal deficit noted  4. HEENMT:  Atraumatic normocephalic, extraocular muscles are intact  5. Respiratory : Clear to auscultation bilaterally, no wheezing or crackles auscultated  6. Cardiovascular : S1-S2, regular, no murmur auscultated  7. Gastrointestinal:  Abdomen is soft, nontender, no organomegaly      Data Review:    CBC Recent Labs  Lab 08/24/19 1934  WBC 10.7*  HGB 11.2*  HCT 35.0*  PLT 286  MCV 95.1  MCH 30.4  MCHC 32.0  RDW 14.8   ------------------------------------------------------------------------------------------------------------------  Results for orders placed or performed during the hospital encounter of 08/24/19 (from the past 48 hour(s))  Basic metabolic panel     Status: Abnormal   Collection Time: 08/24/19  7:34 PM  Result Value Ref Range   Sodium 140 135 - 145 mmol/L   Potassium 3.3 (L) 3.5 - 5.1 mmol/L   Chloride 106 98 - 111 mmol/L   CO2 25 22 - 32 mmol/L   Glucose, Bld 119 (H) 70 - 99 mg/dL    Comment: Glucose reference range applies only to samples taken after fasting for at least 8 hours.   BUN 23 8 - 23 mg/dL   Creatinine, Ser 1.53 (H) 0.44 - 1.00 mg/dL   Calcium 8.9 8.9 - 10.3 mg/dL   GFR calc non Af Amer 33 (L) >60 mL/min   GFR calc Af Amer 38 (L) >60 mL/min   Anion gap 9 5 - 15    Comment: Performed at [facility], [address], [place], [place] [address]  CBC     Status: Abnormal   Collection Time: 08/24/19  7:34 PM  Result Value Ref Range   WBC 10.7 (H) 4.0 - 10.5 K/uL   RBC 3.68 (L) 3.87 - 5.11 MIL/uL   Hemoglobin 11.2 (L) 12.0 - 15.0 g/dL   HCT 35.0 (L) 36.0 - 46.0 %   MCV 95.1 80.0 - 100.0 fL   MCH 30.4 26.0 - 34.0 pg   MCHC 32.0 30.0 - 36.0 g/dL   RDW 14.8 11.5 - 15.5 %   Platelets 286 150 - 400 K/uL    nRBC 0.0 0.0 - 0.2 %    Comment: Performed at [facility], [address] [place], [place] [address]  Brain natriuretic peptide     Status: Abnormal   Collection Time: 08/24/19  7:34 PM  Result Value Ref Range   B Natriuretic Peptide 547.0 (H) 0.0 - 100.0 pg/mL    Comment: Performed at [facility], [address] [place], [place] [address]  Troponin I (High Sensitivity)     Status: Abnormal   Collection Time: 08/24/19  7:34 PM  Result Value Ref Range   Troponin I (High Sensitivity) 19 (H) <18 ng/L    Comment: (NOTE) Elevated high sensitivity troponin I (hsTnI) values and significant  changes across serial measurements may suggest ACS but many other  chronic and acute conditions are known to elevate hsTnI results.  Refer to the "Links" section for chest pain algorithms and additional  guidance. Performed at [facility], [address] [place], [place] [address]  POC SARS Coronavirus 2 Ag-ED - Nasal Swab (BD Veritor Kit)     Status: None   Collection Time: 08/24/19  9:25 PM  Result Value Ref Range   SARS Coronavirus 2 Ag NEGATIVE NEGATIVE    Comment: (NOTE) SARS-CoV-2 antigen NOT DETECTED.  Negative results are presumptive.  Negative results do not preclude SARS-CoV-2 infection and should not be used as the sole basis for treatment or other patient management decisions, including infection  control decisions, particularly in the presence of clinical signs and  symptoms consistent with COVID-19, or in those who have been in contact with the virus.  Negative results must be combined with clinical observations, patient history, and epidemiological information. The expected result is Negative. Fact Sheet for Patients: PodPark.tn Fact Sheet for Healthcare Providers: GiftContent.is This test is not yet approved or cleared by the [place] FDA and  has been authorized for detection and/or diagnosis of SARS-CoV-2 by FDA  under an Emergency Use Authorization (EUA).  This EUA will remain in effect (meaning this test can be used) for the duration of  the COVID-19 de claration under Section 564(b)(1) of the Act, 21 U.S.C. section 360bbb-3(b)(1), unless the authorization is terminated or revoked sooner.   CBG monitoring, ED     Status: Abnormal   Collection Time: 08/24/19 10:40 PM  Result Value Ref Range   Glucose-Capillary 62 (L) 70 - 99 mg/dL    Comment: Glucose reference range applies only to samples taken after fasting for at least 8 hours.    Chemistries  Recent Labs  Lab 08/24/19 1934  NA 140  K 3.3*  CL 106  CO2 25  GLUCOSE 119*  BUN 23  CREATININE 1.53*  CALCIUM 8.9   ------------------------------------------------------------------------------------------------------------------  ------------------------------------------------------------------------------------------------------------------ GFR: Estimated Creatinine Clearance: 30.1 mL/min (A) (by C-G formula based on SCr of 1.53 mg/dL (H)). Liver Function Tests: No results for input(s): AST, ALT, ALKPHOS, BILITOT, PROT, ALBUMIN in the last 168 hours. No results for input(s): LIPASE, AMYLASE in the last 168 hours. No results for input(s): AMMONIA in the last 168 hours. Coagulation Profile: No results for input(s): INR, PROTIME in the last 168 hours. Cardiac Enzymes: No results for input(s): CKTOTAL, CKMB, CKMBINDEX, TROPONINI in the last 168 hours. BNP (last 3 results) No results for input(s): PROBNP in the last 8760 hours. HbA1C: No results for input(s): HGBA1C in the last 72 hours. CBG: Recent Labs  Lab 08/24/19 2240  GLUCAP 62*   Lipid Profile: No results for input(s): CHOL, HDL, LDLCALC, TRIG, CHOLHDL, LDLDIRECT in the last 72 hours. Thyroid Function Tests: No results for input(s): TSH, T4TOTAL, FREET4, T3FREE, THYROIDAB in the last 72 hours. Anemia Panel: No results for input(s): VITAMINB12, FOLATE, FERRITIN, TIBC,  IRON, RETICCTPCT in the last 72 hours.  --------------------------------------------------------------------------------------------------------------- Urine analysis:    Component Value Date/Time   COLORURINE YELLOW 07/08/2019 1229   APPEARANCEUR CLEAR 07/08/2019 1229   APPEARANCEUR Cloudy (A) 07/03/2015 1530   LABSPEC 1.012 07/08/2019 1229   PHURINE 5.0 07/08/2019 1229   GLUCOSEU NEGATIVE 07/08/2019 1229   HGBUR NEGATIVE 07/08/2019 [place] 07/08/2019 1229   BILIRUBINUR negative 07/05/2019 1350   BILIRUBINUR Negative 07/03/2015 1530   KETONESUR NEGATIVE 07/08/2019 1229   PROTEINUR 100 (A) 07/08/2019 1229   UROBILINOGEN 0.2 07/05/2019 1350   UROBILINOGEN 0.2 02/15/2015 1335   NITRITE NEGATIVE 07/08/2019 1229   LEUKOCYTESUR TRACE (A) 07/08/2019 1229      Imaging Results:    DG Chest 2 View  Result Date: 08/24/2019 CLINICAL  DATA:  Dyspnea. EXAM: CHEST - 2 VIEW COMPARISON:  August 24, 2018 FINDINGS: There is stable mild cardiomegaly and atrioventricular cardiac pacemaker with left-sided battery pack. Mild pulmonary edema and trace bilateral pleural effusions have developed. A small amount of pleural fluid layers along the minor fissure. Streaky consolidation in the medial lung bases is likely atelectatic. There are moderate skeletal degenerative changes and upper abdominal surgical clips and diffuse bone demineralization. IMPRESSION: Stable mild cardiomegaly and pacemaker with mild pulmonary edema and bibasilar trace pleural effusion and compressive atelectasis. Electronically Signed   By: Revonda Humphrey   On: 08/24/2019 19:27    My personal review of EKG: Rhythm paced rhythm   Assessment & Plan:    Active Problems:   Acute exacerbation of CHF (congestive heart failure) ([place])   1. Acute on chronic diastolic CHF-BNP is 580, her last BNP a year ago was 149.  Patient received 1 dose of Lasix 40 mg IV in the ED.  She is currently not requiring oxygen.  Will restart  her home dose of Lasix 40 mg p.o. twice daily from tomorrow morning.  Follow BMP in a.m.  Consult cardiology in a.m. for medication adjustment. 2. Diabetes mellitus type 2-patient became hypoglycemic in the ED, will start reduced dose of insulin 70/30, 30 units subcu twice daily from tomorrow morning.  Will initiate sliding scale insulin with NovoLog. 3. Paroxysmal atrial fibrillation-continue Eliquis, amiodarone 4. Tachybradycardia syndrome-s/p Medtronic dual-chamber pacemaker. 5. CAD-s/p DES to left circumflex in 2016, RCA in 2017.  Continue Plavix, Coreg.   DVT Prophylaxis-   patient on Eliquis  AM Labs Ordered, also please review Full Orders  Family Communication: Admission, patients condition and plan of care including tests being ordered have been discussed with the patient  who indicate understanding and agree with the plan and Code Status.  Code Status: Full code  Admission status: Observation      Time spent in minutes : 60 minutes   Brinna Divelbiss S Adante Courington M.D

## 2019-08-24 NOTE — Telephone Encounter (Signed)
Pt had a visit from her home health nurse- stated she was starting to build up fluid again from her CHF   Please give pt a call [phone]

## 2019-08-24 NOTE — Telephone Encounter (Signed)
Called pt. No answer. Left message for pt to return call.  

## 2019-08-24 NOTE — Telephone Encounter (Signed)
Returned pt call. She stated that she is having issues with some swelling in her face, legs and hands over the past week. She stated that her O2 sats have been in the 80's. He home health nurse come by and asked her to take an extra 40 of lasix today and to come to see her cardiologist asap, and if not to be evaluated in the ED. Please advise.

## 2019-08-24 NOTE — ED Notes (Signed)
Per EDP patient given crackers and peanut butter with orange and apple juice for her low blood sugar.

## 2019-08-24 NOTE — ED Triage Notes (Signed)
Patient complains of shortness of breath and was sent by Dr. Armandina Gemma for evaluation.  Patient does not use oxygen at home. HX of CHF. Patient states her oxygen saturation at home were as low as 72, O2 sats in triage are 97.

## 2019-08-25 DIAGNOSIS — I5033 Acute on chronic diastolic (congestive) heart failure: Secondary | ICD-10-CM | POA: Diagnosis present

## 2019-08-25 DIAGNOSIS — L930 Discoid lupus erythematosus: Secondary | ICD-10-CM | POA: Diagnosis present

## 2019-08-25 DIAGNOSIS — I10 Essential (primary) hypertension: Secondary | ICD-10-CM | POA: Diagnosis not present

## 2019-08-25 DIAGNOSIS — I252 Old myocardial infarction: Secondary | ICD-10-CM | POA: Diagnosis not present

## 2019-08-25 DIAGNOSIS — E11649 Type 2 diabetes mellitus with hypoglycemia without coma: Secondary | ICD-10-CM | POA: Diagnosis present

## 2019-08-25 DIAGNOSIS — N179 Acute kidney failure, unspecified: Secondary | ICD-10-CM | POA: Diagnosis present

## 2019-08-25 DIAGNOSIS — E1121 Type 2 diabetes mellitus with diabetic nephropathy: Secondary | ICD-10-CM | POA: Diagnosis not present

## 2019-08-25 DIAGNOSIS — I509 Heart failure, unspecified: Secondary | ICD-10-CM | POA: Diagnosis not present

## 2019-08-25 DIAGNOSIS — K219 Gastro-esophageal reflux disease without esophagitis: Secondary | ICD-10-CM | POA: Diagnosis present

## 2019-08-25 DIAGNOSIS — I130 Hypertensive heart and chronic kidney disease with heart failure and stage 1 through stage 4 chronic kidney disease, or unspecified chronic kidney disease: Secondary | ICD-10-CM | POA: Diagnosis present

## 2019-08-25 DIAGNOSIS — I459 Conduction disorder, unspecified: Secondary | ICD-10-CM | POA: Diagnosis present

## 2019-08-25 DIAGNOSIS — E1151 Type 2 diabetes mellitus with diabetic peripheral angiopathy without gangrene: Secondary | ICD-10-CM | POA: Diagnosis present

## 2019-08-25 DIAGNOSIS — M1990 Unspecified osteoarthritis, unspecified site: Secondary | ICD-10-CM | POA: Diagnosis present

## 2019-08-25 DIAGNOSIS — I081 Rheumatic disorders of both mitral and tricuspid valves: Secondary | ICD-10-CM | POA: Diagnosis present

## 2019-08-25 DIAGNOSIS — R0602 Shortness of breath: Secondary | ICD-10-CM | POA: Diagnosis present

## 2019-08-25 DIAGNOSIS — I4892 Unspecified atrial flutter: Secondary | ICD-10-CM | POA: Diagnosis present

## 2019-08-25 DIAGNOSIS — I5031 Acute diastolic (congestive) heart failure: Secondary | ICD-10-CM | POA: Diagnosis not present

## 2019-08-25 DIAGNOSIS — I2510 Atherosclerotic heart disease of native coronary artery without angina pectoris: Secondary | ICD-10-CM | POA: Diagnosis present

## 2019-08-25 DIAGNOSIS — N1832 Chronic kidney disease, stage 3b: Secondary | ICD-10-CM | POA: Diagnosis present

## 2019-08-25 DIAGNOSIS — Z955 Presence of coronary angioplasty implant and graft: Secondary | ICD-10-CM | POA: Diagnosis not present

## 2019-08-25 DIAGNOSIS — Z20822 Contact with and (suspected) exposure to covid-19: Secondary | ICD-10-CM | POA: Diagnosis present

## 2019-08-25 DIAGNOSIS — Z950 Presence of cardiac pacemaker: Secondary | ICD-10-CM | POA: Diagnosis not present

## 2019-08-25 DIAGNOSIS — D519 Vitamin B12 deficiency anemia, unspecified: Secondary | ICD-10-CM | POA: Diagnosis present

## 2019-08-25 DIAGNOSIS — I480 Paroxysmal atrial fibrillation: Secondary | ICD-10-CM | POA: Diagnosis present

## 2019-08-25 DIAGNOSIS — E039 Hypothyroidism, unspecified: Secondary | ICD-10-CM | POA: Diagnosis present

## 2019-08-25 DIAGNOSIS — E785 Hyperlipidemia, unspecified: Secondary | ICD-10-CM | POA: Diagnosis present

## 2019-08-25 DIAGNOSIS — I495 Sick sinus syndrome: Secondary | ICD-10-CM | POA: Diagnosis present

## 2019-08-25 DIAGNOSIS — E1122 Type 2 diabetes mellitus with diabetic chronic kidney disease: Secondary | ICD-10-CM | POA: Diagnosis present

## 2019-08-25 DIAGNOSIS — I482 Chronic atrial fibrillation, unspecified: Secondary | ICD-10-CM | POA: Diagnosis present

## 2019-08-25 LAB — COMPREHENSIVE METABOLIC PANEL
ALT: 18 U/L (ref 0–44)
AST: 13 U/L — ABNORMAL LOW (ref 15–41)
Albumin: 3 g/dL — ABNORMAL LOW (ref 3.5–5.0)
Alkaline Phosphatase: 70 U/L (ref 38–126)
Anion gap: 10 (ref 5–15)
BUN: 24 mg/dL — ABNORMAL HIGH (ref 8–23)
CO2: 25 mmol/L (ref 22–32)
Calcium: 8.3 mg/dL — ABNORMAL LOW (ref 8.9–10.3)
Chloride: 107 mmol/L (ref 98–111)
Creatinine, Ser: 1.4 mg/dL — ABNORMAL HIGH (ref 0.44–1.00)
GFR calc Af Amer: 43 mL/min — ABNORMAL LOW (ref >60)
GFR calc non Af Amer: 37 mL/min — ABNORMAL LOW (ref >60)
Glucose, Bld: 131 mg/dL — ABNORMAL HIGH (ref 70–99)
Potassium: 3.5 mmol/L (ref 3.5–5.1)
Sodium: 142 mmol/L (ref 135–145)
Total Bilirubin: 0.9 mg/dL (ref 0.3–1.2)
Total Protein: 5.7 g/dL — ABNORMAL LOW (ref 6.5–8.1)

## 2019-08-25 LAB — CBC
HCT: 31 % — ABNORMAL LOW (ref 36.0–46.0)
Hemoglobin: 9.8 g/dL — ABNORMAL LOW (ref 12.0–15.0)
MCH: 30.2 pg (ref 26.0–34.0)
MCHC: 31.6 g/dL (ref 30.0–36.0)
MCV: 95.4 fL (ref 80.0–100.0)
Platelets: 259 10*3/uL (ref 150–400)
RBC: 3.25 MIL/uL — ABNORMAL LOW (ref 3.87–5.11)
RDW: 14.6 % (ref 11.5–15.5)
WBC: 7.9 10*3/uL (ref 4.0–10.5)
nRBC: 0 % (ref 0.0–0.2)

## 2019-08-25 LAB — GLUCOSE, CAPILLARY
Glucose-Capillary: 102 mg/dL — ABNORMAL HIGH (ref 70–99)
Glucose-Capillary: 126 mg/dL — ABNORMAL HIGH (ref 70–99)
Glucose-Capillary: 143 mg/dL — ABNORMAL HIGH (ref 70–99)
Glucose-Capillary: 87 mg/dL (ref 70–99)

## 2019-08-25 LAB — HEMOGLOBIN A1C
Hgb A1c MFr Bld: 7 % — ABNORMAL HIGH (ref 4.8–5.6)
Mean Plasma Glucose: 154.2 mg/dL

## 2019-08-25 LAB — SARS CORONAVIRUS 2 (TAT 6-24 HRS): SARS Coronavirus 2: NEGATIVE

## 2019-08-25 MED ORDER — IRBESARTAN 150 MG PO TABS
300.0000 mg | ORAL_TABLET | Freq: Every day | ORAL | Status: DC
  Administered 2019-08-25 – 2019-08-26 (×2): 300 mg via ORAL
  Filled 2019-08-25 (×2): qty 2

## 2019-08-25 MED ORDER — FUROSEMIDE 40 MG PO TABS
40.0000 mg | ORAL_TABLET | Freq: Two times a day (BID) | ORAL | Status: DC
  Administered 2019-08-25: 09:00:00 40 mg via ORAL
  Filled 2019-08-25: qty 1

## 2019-08-25 MED ORDER — CARVEDILOL 12.5 MG PO TABS
25.0000 mg | ORAL_TABLET | Freq: Two times a day (BID) | ORAL | Status: DC
  Administered 2019-08-25 – 2019-08-26 (×4): 25 mg via ORAL
  Filled 2019-08-25 (×4): qty 2

## 2019-08-25 MED ORDER — POTASSIUM CHLORIDE CRYS ER 20 MEQ PO TBCR
20.0000 meq | EXTENDED_RELEASE_TABLET | Freq: Every day | ORAL | Status: DC
  Administered 2019-08-25 – 2019-08-26 (×2): 20 meq via ORAL
  Filled 2019-08-25 (×2): qty 1

## 2019-08-25 MED ORDER — LEVOTHYROXINE SODIUM 137 MCG PO TABS
137.0000 ug | ORAL_TABLET | Freq: Every day | ORAL | Status: DC
  Administered 2019-08-25 – 2019-08-26 (×2): 137 ug via ORAL
  Filled 2019-08-25 (×3): qty 1

## 2019-08-25 MED ORDER — SODIUM CHLORIDE 0.9 % IV SOLN: 250.0000 mL | INTRAVENOUS | Status: DC | PRN

## 2019-08-25 MED ORDER — ACETAMINOPHEN 325 MG PO TABS: 650.0000 mg | ORAL_TABLET | Freq: Four times a day (QID) | ORAL | Status: DC | PRN

## 2019-08-25 MED ORDER — SODIUM CHLORIDE 0.9% FLUSH: 3.0000 mL | INTRAVENOUS | Status: DC | PRN

## 2019-08-25 MED ORDER — ONDANSETRON HCL 4 MG PO TABS: 4.0000 mg | ORAL_TABLET | Freq: Four times a day (QID) | ORAL | Status: DC | PRN

## 2019-08-25 MED ORDER — AMIODARONE HCL 200 MG PO TABS
100.0000 mg | ORAL_TABLET | Freq: Every day | ORAL | Status: DC
  Administered 2019-08-25 – 2019-08-26 (×2): 100 mg via ORAL
  Filled 2019-08-25 (×2): qty 1

## 2019-08-25 MED ORDER — CLOPIDOGREL BISULFATE 75 MG PO TABS
75.0000 mg | ORAL_TABLET | Freq: Every day | ORAL | Status: DC
  Administered 2019-08-25 – 2019-08-26 (×2): 75 mg via ORAL
  Filled 2019-08-25 (×2): qty 1

## 2019-08-25 MED ORDER — SODIUM CHLORIDE 0.9% FLUSH
3.0000 mL | Freq: Two times a day (BID) | INTRAVENOUS | Status: DC
  Administered 2019-08-25 – 2019-08-26 (×4): 3 mL via INTRAVENOUS

## 2019-08-25 MED ORDER — ROPINIROLE HCL 0.25 MG PO TABS
0.5000 mg | ORAL_TABLET | Freq: Every day | ORAL | Status: DC
  Administered 2019-08-25 (×2): 0.5 mg via ORAL
  Filled 2019-08-25 (×2): qty 2

## 2019-08-25 MED ORDER — INSULIN ASPART PROT & ASPART (70-30 MIX) 100 UNIT/ML ~~LOC~~ SUSP
30.0000 [IU] | Freq: Two times a day (BID) | SUBCUTANEOUS | Status: DC
  Administered 2019-08-25 – 2019-08-26 (×3): 30 [IU] via SUBCUTANEOUS
  Filled 2019-08-25 (×2): qty 10

## 2019-08-25 MED ORDER — ONDANSETRON HCL 4 MG/2ML IJ SOLN: 4.0000 mg | Freq: Four times a day (QID) | INTRAMUSCULAR | Status: DC | PRN

## 2019-08-25 MED ORDER — FUROSEMIDE 10 MG/ML IJ SOLN
30.0000 mg | Freq: Two times a day (BID) | INTRAMUSCULAR | Status: DC
  Administered 2019-08-25 – 2019-08-26 (×2): 30 mg via INTRAVENOUS
  Filled 2019-08-25 (×2): qty 4

## 2019-08-25 MED ORDER — APIXABAN 5 MG PO TABS
5.0000 mg | ORAL_TABLET | Freq: Two times a day (BID) | ORAL | Status: DC
  Administered 2019-08-25 – 2019-08-26 (×4): 5 mg via ORAL
  Filled 2019-08-25 (×4): qty 1

## 2019-08-25 MED ORDER — ATORVASTATIN CALCIUM 40 MG PO TABS
80.0000 mg | ORAL_TABLET | Freq: Every day | ORAL | Status: DC
  Administered 2019-08-25: 80 mg via ORAL
  Filled 2019-08-25 (×2): qty 2

## 2019-08-25 MED ORDER — DILTIAZEM HCL ER COATED BEADS 180 MG PO CP24
360.0000 mg | ORAL_CAPSULE | Freq: Every day | ORAL | Status: DC
  Administered 2019-08-25 – 2019-08-26 (×2): 360 mg via ORAL
  Filled 2019-08-25 (×2): qty 2

## 2019-08-25 MED ORDER — INSULIN ASPART 100 UNIT/ML ~~LOC~~ SOLN
0.0000 [IU] | Freq: Three times a day (TID) | SUBCUTANEOUS | Status: DC
  Administered 2019-08-25 – 2019-08-26 (×4): 1 [IU] via SUBCUTANEOUS

## 2019-08-25 NOTE — Progress Notes (Signed)
PROGRESS NOTE    Virginia Huber  LNL:[id number] DOB: 1946/06/16 DOA: 08/24/2019 PCP: Sharilyn Sites, MD     Brief Narrative:  As per H&P written by Dr. Darrick Meigs on 08/24/2019.   74 y.o. female, with history of diastolic heart failure, paroxysmal atrial fibrillation on Eliquis, CAD on Plavix, s/p pacemaker implant for tachybradycardia syndrome, hypertension, hyperlipidemia came to ED with complaints of shortness of breath.  Patient states that symptoms began 3 days ago when she becomes short of breath on exertion.  She checked her O2 sats at home and was dropped to 72%.  Patient has no history of COPD stable.  She has been taking her medications.  Today she took extra dose of Lasix as recommended by her physical therapist. She denies fever or chills. Denies nausea vomiting or diarrhea. Denies abdominal pain or dysuria.  Assessment & Plan: 1-Acute exacerbation of chronic diastolic heart failure ([place]) -Still complaining of orthopnea and with fine crackles on examination -Short of breath reported on exertion -No oxygen supplementation needed at rest -Continue to follow daily weights, strict I's and O's, low-sodium diet and IV diuresis -Follow clinical response and 2D echo results. -Continue monitoring telemetry -Follow renal function electrolytes closely while actively diuresing patient.  2-chronic atrial fibrillation -Continue amiodarone and Cardizem for rate control -Continue Eliquis for secondary prevention.  3-hypothyroidism -Continue Synthroid.  4-essential hypertension  -diltiazem, carvedilol and diuretics will continue helping controlling blood pressure. -Follow vital signs closely and further adjust antihypertensive treatment as required.  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  6-hyperlipidemia -Continue Lipitor.  7-type 2 diabetes mellitus with chronic use of insulin and chronic kidney disease a stage III -Continue sliding scale insulin -Modified carbohydrate diet has been encouraged. -A1c 7.0   DVT prophylaxis: Apixaban. Code Status: Full code Family Communication: No family at bedside. Disposition Plan: Remains inpatient, continue IV Lasix, follow daily weights, strict I's and O's and follow echo results.  Cardiology service was consulted and will touch base with then to further adjust medications prior to discharge.  Continue close monitoring of renal function and electrolytes while actively diuresing patient.  Consultants:   Cardiology service was consulted on admission.  Procedures:   See below for x-ray reports.  2D echo: Pending.  Antimicrobials:  Anti-infectives (From admission, onward)   None      Subjective: No fever, no chest pain, no nausea, no vomiting.  Still reporting mild orthopnea and shortness of breath.  At rest no requiring oxygen supplementation.  Objective: Vitals:   08/25/19 0100 08/25/19 0544 08/25/19 0902 08/25/19 1339  BP: 130/61 118/63  112/63  Pulse: (!) 105 (!) 104  (!) 110  Resp:  16  17  Temp:  98.6 F (37 C)  98.1 F (36.7 C)  TempSrc:  Oral  Oral  SpO2:  90% 92% 98%  Weight:      Height:        Intake/Output Summary (Last 24 hours) at 08/25/2019 1433 Last data filed at 08/25/2019 1300 Gross per 24 hour  Intake 483 ml  Output 650 ml  Net -167 ml   Filed Weights   08/24/19 1820  Weight: 69.2 kg    Examination: General exam: Alert, awake, oriented x 3; reports feeling better but is still short of breath on exertion.  Also still complaining of mild orthopnea.  No requiring oxygen  supplementation at rest. Respiratory system: Fine crackles at the bases, no using accessory muscles, fair air movement bilaterally.  No wheezing. Cardiovascular system: Rate controlled, S1 and S2 appreciated on  exam; no JVD seen.  No rubs or gallops appreciated.   Gastrointestinal system: Abdomen is nondistended, soft and nontender. No organomegaly or masses felt. Normal bowel sounds heard. Central nervous system: Alert and oriented. No focal neurological deficits. Extremities: No cyanosis or clubbing; trace edema bilaterally. Skin: No rashes, no petechiae. Psychiatry: Judgement and insight appear normal. Mood & affect appropriate.     Data Reviewed: I have personally reviewed following labs and imaging studies  CBC: Recent Labs  Lab 08/24/19 1934 08/25/19 0412  WBC 10.7* 7.9  HGB 11.2* 9.8*  HCT 35.0* 31.0*  MCV 95.1 95.4  PLT 286 923   Basic Metabolic Panel: Recent Labs  Lab 08/24/19 1934 08/25/19 0412  NA 140 142  K 3.3* 3.5  CL 106 107  CO2 25 25  GLUCOSE 119* 131*  BUN 23 24*  CREATININE 1.53* 1.40*  CALCIUM 8.9 8.3*   GFR: Estimated Creatinine Clearance: 32.9 mL/min (A) (by C-G formula based on SCr of 1.4 mg/dL (H)).   Liver Function Tests: Recent Labs  Lab 08/25/19 0412  AST 13*  ALT 18  ALKPHOS 70  BILITOT 0.9  PROT 5.7*  ALBUMIN 3.0*   HbA1C: Recent Labs    08/24/19 1934  HGBA1C 7.0*   CBG: Recent Labs  Lab 08/24/19 2240 08/25/19 0733 08/25/19 1105  GLUCAP 62* 102* 126*   Urine analysis:    Component Value Date/Time   COLORURINE YELLOW 07/08/2019 [place] 07/08/2019 1229   APPEARANCEUR Cloudy (A) 07/03/2015 1530   LABSPEC 1.012 07/08/2019 1229   PHURINE 5.0 07/08/2019 1229   GLUCOSEU NEGATIVE 07/08/2019 1229   HGBUR NEGATIVE 07/08/2019 [place] 07/08/2019 1229   BILIRUBINUR negative 07/05/2019 1350   BILIRUBINUR Negative 07/03/2015 1530   KETONESUR NEGATIVE 07/08/2019 1229   PROTEINUR 100 (A) 07/08/2019 1229   UROBILINOGEN 0.2 07/05/2019 1350   UROBILINOGEN 0.2 02/15/2015 1335   NITRITE NEGATIVE 07/08/2019 1229   LEUKOCYTESUR TRACE (A) 07/08/2019 1229    Recent Results (from the past 240  hour(s))  SARS CORONAVIRUS 2 (TAT 6-24 HRS) Nasopharyngeal Nasopharyngeal Swab     Status: None   Collection Time: 08/24/19 10:00 PM   Specimen: Nasopharyngeal Swab  Result Value Ref Range Status   SARS Coronavirus 2 NEGATIVE NEGATIVE Final    Comment: (NOTE) SARS-CoV-2 target nucleic acids are NOT DETECTED. The SARS-CoV-2 RNA is generally detectable in upper and lower respiratory specimens during the acute phase of infection. Negative results do not preclude SARS-CoV-2 infection, do not rule out co-infections with other pathogens, and should not be used as the sole basis for treatment or other patient management decisions. Negative results must be combined with clinical observations, patient history, and epidemiological information. The expected result is Negative. Fact Sheet for Patients: SugarRoll.be Fact Sheet for Healthcare Providers: https://www.woods-mathews.com/ This test is not yet approved or cleared by the [place] FDA and  has been authorized for detection and/or diagnosis of SARS-CoV-2 by FDA under an Emergency Use Authorization (EUA). This EUA will remain  in effect (meaning this test can be used) for the duration of the COVID-19 declaration under Section 56 4(b)(1) of the Act, 21 U.S.C. section 360bbb-3(b)(1), unless the authorization is terminated or revoked sooner. Performed at [facility], [place] [address] [place], [place] [address]  Radiology Studies: DG Chest 2 View  Result Date: 08/24/2019 CLINICAL DATA:  Dyspnea. EXAM: CHEST - 2 VIEW COMPARISON:  August 24, 2018 FINDINGS: There is stable mild cardiomegaly and atrioventricular cardiac pacemaker with left-sided battery pack. Mild pulmonary edema and trace bilateral pleural effusions have developed. A small amount of pleural fluid layers along the minor fissure. Streaky consolidation in the medial lung bases is likely atelectatic. There are moderate skeletal  degenerative changes and upper abdominal surgical clips and diffuse bone demineralization. IMPRESSION: Stable mild cardiomegaly and pacemaker with mild pulmonary edema and bibasilar trace pleural effusion and compressive atelectasis. Electronically Signed   By: Revonda Humphrey   On: 08/24/2019 19:27    Scheduled Meds: . amiodarone  100 mg Oral Daily  . apixaban  5 mg Oral BID  . atorvastatin  80 mg Oral q1800  . carvedilol  25 mg Oral BID  . clopidogrel  75 mg Oral Daily  . diltiazem  360 mg Oral Daily  . furosemide  30 mg Intravenous Q12H  . insulin aspart  0-9 Units Subcutaneous TID WC  . insulin aspart protamine- aspart  30 Units Subcutaneous BID WC  . irbesartan  300 mg Oral Daily  . levothyroxine  137 mcg Oral QAC breakfast  . potassium chloride SA  20 mEq Oral Daily  . rOPINIRole  0.5 mg Oral QHS  . sodium chloride flush  3 mL Intravenous Q12H   Continuous Infusions: . sodium chloride       LOS: 0 days    Time spent: 30 minutes.    Barton Dubois, MD [facility] Pager [phone]   08/25/2019, 2:33 PM

## 2019-08-26 ENCOUNTER — Inpatient Hospital Stay (HOSPITAL_COMMUNITY): Payer: PPO

## 2019-08-26 DIAGNOSIS — I10 Essential (primary) hypertension: Secondary | ICD-10-CM

## 2019-08-26 DIAGNOSIS — I5031 Acute diastolic (congestive) heart failure: Secondary | ICD-10-CM

## 2019-08-26 DIAGNOSIS — I5033 Acute on chronic diastolic (congestive) heart failure: Secondary | ICD-10-CM

## 2019-08-26 LAB — BASIC METABOLIC PANEL
Anion gap: 9 (ref 5–15)
BUN: 29 mg/dL — ABNORMAL HIGH (ref 8–23)
CO2: 26 mmol/L (ref 22–32)
Calcium: 8.1 mg/dL — ABNORMAL LOW (ref 8.9–10.3)
Chloride: 105 mmol/L (ref 98–111)
Creatinine, Ser: 1.52 mg/dL — ABNORMAL HIGH (ref 0.44–1.00)
GFR calc Af Amer: 39 mL/min — ABNORMAL LOW (ref >60)
GFR calc non Af Amer: 33 mL/min — ABNORMAL LOW (ref >60)
Glucose, Bld: 225 mg/dL — ABNORMAL HIGH (ref 70–99)
Potassium: 4.2 mmol/L (ref 3.5–5.1)
Sodium: 140 mmol/L (ref 135–145)

## 2019-08-26 LAB — ECHOCARDIOGRAM COMPLETE
Height: 63 in
Weight: 2441.6 oz

## 2019-08-26 LAB — GLUCOSE, CAPILLARY
Glucose-Capillary: 143 mg/dL — ABNORMAL HIGH (ref 70–99)
Glucose-Capillary: 124 mg/dL — ABNORMAL HIGH (ref 70–99)

## 2019-08-26 MED ORDER — FUROSEMIDE 40 MG PO TABS: ORAL_TABLET | ORAL | 4 refills | Status: DC

## 2019-08-26 NOTE — Progress Notes (Signed)
*  PRELIMINARY RESULTS* Echocardiogram 2D Echocardiogram has been performed.  Leavy Cella 08/26/2019, 9:53 AM

## 2019-08-26 NOTE — Progress Notes (Signed)
Nsg Discharge Note  Admit Date:  08/24/2019 Discharge date: 08/26/2019   [name] to be D/C'd home per MD order.  AVS completed.  Copy for chart, and copy for patient signed, and dated. Patient/caregiver able to verbalize understanding.  Discharge Medication: Allergies as of 08/26/2019      Reactions   Penicillins Hives   Has patient had a PCN reaction causing immediate rash, facial/tongue/throat swelling, SOB or lightheadedness with hypotension: Yes Has patient had a PCN reaction causing severe rash involving mucus membranes or skin necrosis: No Has patient had a PCN reaction that required hospitalization No Has patient had a PCN reaction occurring within the last 10 years: No If all of the above answers are "NO", then may proceed with Cephalosporin use.   Percocet [oxycodone-acetaminophen] Nausea And Vomiting      Medication List    STOP taking these medications   glucose blood test strip Commonly known as: FREESTYLE LITE   traZODone 50 MG tablet Commonly known as: DESYREL     TAKE these medications   acetaminophen 325 MG tablet Commonly known as: TYLENOL Take 650 mg by mouth every 6 (six) hours as needed for headache.   amiodarone 200 MG tablet Commonly known as: PACERONE Take 0.5 tablets (100 mg total) by mouth daily.   apixaban 5 MG Tabs tablet Commonly known as: Eliquis Take 1 tablet (5 mg total) by mouth 2 (two) times daily.   atorvastatin 80 MG tablet Commonly known as: LIPITOR TAKE ONE TABLET BY MOUTH EVERY DAY AT 6:00PM   carvedilol 25 MG tablet Commonly known as: COREG Take 1 tablet (25 mg total) by mouth 2 (two) times daily.   clopidogrel 75 MG tablet Commonly known as: PLAVIX TAKE ONE TABLET (75MG  TOTAL) BY MOUTH DAILY   cyanocobalamin 1000 MCG/ML injection Commonly known as: (VITAMIN B-12) Inject 1,000 mcg into the muscle every 30 (thirty) days. Last injection was around 08/02/19   diltiazem 360 MG 24 hr capsule Commonly known as: Cardizem  CD Take 1 capsule (360 mg total) by mouth daily.   furosemide 40 MG tablet Commonly known as: LASIX TAKE ONE AND HALF TABLET (60MG  TOTAL) BY MOUTH IN THE MORNING AND ONE TABLET IN THE EVENING (40MG  TOTAL) What changed: additional instructions   insulin NPH-regular Human (70-30) 100 UNIT/ML injection Inject 40 Units into the skin See admin instructions. Inject 25 units before breakfast and 25 units before supper  - only if pre-meal blood glucose is above 90 mg/dL. What changed: additional instructions   levothyroxine 137 MCG tablet Commonly known as: SYNTHROID Take 137 mcg by mouth daily before breakfast.   nitroGLYCERIN 0.4 MG SL tablet Commonly known as: NITROSTAT Place 1 tablet (0.4 mg total) under the tongue every 5 (five) minutes as needed for chest pain.   olmesartan 40 MG tablet Commonly known as: BENICAR Take 40 mg by mouth daily.   potassium chloride SA 20 MEQ tablet Commonly known as: KLOR-CON TAKE ONE (1) TABLET BY MOUTH EVERY DAY   rOPINIRole 0.5 MG tablet Commonly known as: REQUIP Take 0.5 mg by mouth at bedtime.   Vitamin D (Ergocalciferol) 1.25 MG (50000 UNIT) Caps capsule Commonly known as: DRISDOL Take 50,000 Units by mouth every 7 (seven) days. On tuesdays       Discharge Assessment: Vitals:   08/26/19 0550 08/26/19 1437  BP: 116/79 (!) 146/51  Pulse: 68 71  Resp: 20 17  Temp: 97.8 F (36.6 C) 98.2 F (36.8 C)  SpO2: 94% 97%  Skin clean, dry and intact without evidence of skin break down, no evidence of skin tears noted. IV catheter discontinued intact. Site without signs and symptoms of complications - no redness or edema noted at insertion site, patient denies c/o pain - only slight tenderness at site.  Dressing with slight pressure applied.  D/c Instructions-Education: Discharge instructions given to patient/family with verbalized understanding. D/c education completed with patient/family including follow up instructions, medication list, d/c  activities limitations if indicated, with other d/c instructions as indicated by MD - patient able to verbalize understanding, all questions fully answered. Patient instructed to return to ED, call 911, or call MD for any changes in condition.  Patient escorted via [place], and D/C home via private auto.  Venita Sheffield, RN 08/26/2019 4:54 PM

## 2019-08-26 NOTE — Discharge Summary (Signed)
Physician Discharge Summary  Virginia Huber XTK:[id number] DOB: 1945/12/20 DOA: 08/24/2019  PCP: Sharilyn Sites, MD  Admit date: 08/24/2019 Discharge date: 08/26/2019  Time spent: 35 minutes  Recommendations for Outpatient Follow-up:  1. Repeat basic metabolic panel to follow electrolytes and renal function 2. Reassess blood pressure/volume status and adjust antihypertensive and diuretic regimen as needed 3. Outpatient follow-up with cardiology service as previously instructed.   Discharge Diagnoses: Type 2 diabetes with nephropathy ([facility]) Acute on Chronic renal failure, stage 3b Acute exacerbation of chronic diastolic (congestive heart failure) (HCC) CHF exacerbation (HCC) Essential hypertension Hypothyroidism  Discharge Condition: Stable and improved.  Patient discharged home with instruction to follow-up with PCP in 10 days.  Code status: Full code  Diet recommendation: Heart healthy/modified carbohydrate diet  Filed Weights   08/24/19 1820  Weight: 69.2 kg    History of present illness:  As per H&P written by Dr. Darrick Meigs on 08/24/2019.   74 y.o.female,with history of diastolic heart failure, paroxysmal atrial fibrillation on Eliquis, CAD on Plavix, s/p pacemaker implant for tachybradycardia syndrome, hypertension, hyperlipidemia came to ED with complaints of shortness of breath. Patient states that symptoms began 3 days ago when she becomes short of breath on exertion. She checked her O2 sats at home and was dropped to 72%. Patient has no history of COPD stable. She has been taking her medications. Today she took extra dose of Lasix as recommended by her physical therapist. She denies fever or chills. Denies nausea vomiting or diarrhea. Denies abdominal pain or dysuria.  Hospital Course:  1-Acute exacerbation of chronic diastolic heart failure (HCC) -Breathing is stabilized and back to baseline; no complaining of orthopnea and no requiring oxygen supplementation. -Continue  to follow daily weights and low-sodium diet. -Patient will be discharged on Lasix 60 mg in the morning and 40 mg at nighttime -Outpatient follow-up with cardiology service as previously instructed. -Repeat basic metabolic panel to follow renal function and electrolytes at follow-up visit.  2-chronic atrial fibrillation -Continue amiodarone and Cardizem for rate control -Continue Eliquis for secondary prevention.  3-hypothyroidism -Continue Synthroid.  4-essential hypertension  -diltiazem, carvedilol and diuretics will continue helping controlling blood pressure. -Follow vital signs closely and further adjust antihypertensive treatment as required. -Heart healthy diet encouraged.  5-acute on chronic renal failure (stage IIIb at baseline) -In the setting of CHF exacerbation and impaired perfusion -Creatinine improved and back to her baseline after diuresis -Advised to maintain adequate hydration and to follow low-sodium diet.  6-hyperlipidemia -Continue Lipitor.  7-type 2 diabetes mellitus with chronic use of insulin and chronic kidney disease a stage III -Resume home hypoglycemic regimen. -Modified carbohydrate diet has been encouraged. -A1c 7.0  Procedures:  See below for x-ray reports  2D echo: Ejection fraction 55 to 60%, no wall motion abnormalities appreciated.  Positive concentric hypertrophy of her left ventricle appreciated.  Unable to assess for diastolic dysfunction in the setting of atrial fibrillation.  Consultations:  None  Discharge Exam: Vitals:   08/26/19 0550 08/26/19 1437  BP: 116/79 (!) 146/51  Pulse: 68 71  Resp: 20 17  Temp: 97.8 F (36.6 C) 98.2 F (36.8 C)  SpO2: 94% 97%    General: Alert, awake and oriented x3; reports feeling significantly better and currently denying orthopnea.  No requiring oxygen supplementation and no having any chest pain or heart racing feeling. Cardiovascular: S1 and S2; rate controlled.  No JVD appreciated on  exam.  No rubs, no gallops. Respiratory: Good air movement bilaterally, good oxygen saturation on room  air; no using accessory muscle.  Normal respiratory effort. Abdomen: Soft, nontender, nondistended, positive bowel sounds. Extremities: No edema, no cyanosis or clubbing.  Discharge Instructions   Discharge Instructions    (HEART FAILURE PATIENTS) Call MD:  Anytime you have any of the following symptoms: 1) 3 pound weight gain in 24 hours or 5 pounds in 1 week 2) shortness of breath, with or without a dry hacking cough 3) swelling in the hands, feet or stomach 4) if you have to sleep on extra pillows at night in order to breathe.   Complete by: As directed    Diet - low sodium heart healthy   Complete by: As directed    Discharge instructions   Complete by: As directed    Follow low-sodium diet (less than 2.5 g of sodium daily OR 2500 mg). Maintain adequate hydration Take medications as prescribed Arrange follow-up with PCP in 10 days Follow-up with cardiology service as instructed Check your weight on daily basis.     Allergies as of 08/26/2019      Reactions   Penicillins Hives   Has patient had a PCN reaction causing immediate rash, facial/tongue/throat swelling, SOB or lightheadedness with hypotension: Yes Has patient had a PCN reaction causing severe rash involving mucus membranes or skin necrosis: No Has patient had a PCN reaction that required hospitalization No Has patient had a PCN reaction occurring within the last 10 years: No If all of the above answers are "NO", then may proceed with Cephalosporin use.   Percocet [oxycodone-acetaminophen] Nausea And Vomiting      Medication List    STOP taking these medications   glucose blood test strip Commonly known as: FREESTYLE LITE   traZODone 50 MG tablet Commonly known as: DESYREL     TAKE these medications   acetaminophen 325 MG tablet Commonly known as: TYLENOL Take 650 mg by mouth every 6 (six) hours as needed for  headache.   amiodarone 200 MG tablet Commonly known as: PACERONE Take 0.5 tablets (100 mg total) by mouth daily.   apixaban 5 MG Tabs tablet Commonly known as: Eliquis Take 1 tablet (5 mg total) by mouth 2 (two) times daily.   atorvastatin 80 MG tablet Commonly known as: LIPITOR TAKE ONE TABLET BY MOUTH EVERY DAY AT 6:00PM   carvedilol 25 MG tablet Commonly known as: COREG Take 1 tablet (25 mg total) by mouth 2 (two) times daily.   clopidogrel 75 MG tablet Commonly known as: PLAVIX TAKE ONE TABLET (75MG  TOTAL) BY MOUTH DAILY   cyanocobalamin 1000 MCG/ML injection Commonly known as: (VITAMIN B-12) Inject 1,000 mcg into the muscle every 30 (thirty) days. Last injection was around 08/02/19   diltiazem 360 MG 24 hr capsule Commonly known as: Cardizem CD Take 1 capsule (360 mg total) by mouth daily.   furosemide 40 MG tablet Commonly known as: LASIX TAKE ONE AND HALF TABLET (60MG  TOTAL) BY MOUTH IN THE MORNING AND ONE TABLET IN THE EVENING (40MG  TOTAL) What changed: additional instructions   insulin NPH-regular Human (70-30) 100 UNIT/ML injection Inject 40 Units into the skin See admin instructions. Inject 25 units before breakfast and 25 units before supper  - only if pre-meal blood glucose is above 90 mg/dL. What changed: additional instructions   levothyroxine 137 MCG tablet Commonly known as: SYNTHROID Take 137 mcg by mouth daily before breakfast.   nitroGLYCERIN 0.4 MG SL tablet Commonly known as: NITROSTAT Place 1 tablet (0.4 mg total) under the tongue every 5 (five) minutes  as needed for chest pain.   olmesartan 40 MG tablet Commonly known as: BENICAR Take 40 mg by mouth daily.   potassium chloride SA 20 MEQ tablet Commonly known as: KLOR-CON TAKE ONE (1) TABLET BY MOUTH EVERY DAY   rOPINIRole 0.5 MG tablet Commonly known as: REQUIP Take 0.5 mg by mouth at bedtime.   Vitamin D (Ergocalciferol) 1.25 MG (50000 UNIT) Caps capsule Commonly known as:  DRISDOL Take 50,000 Units by mouth every 7 (seven) days. On tuesdays      Allergies  Allergen Reactions  . Penicillins Hives    Has patient had a PCN reaction causing immediate rash, facial/tongue/throat swelling, SOB or lightheadedness with hypotension: Yes Has patient had a PCN reaction causing severe rash involving mucus membranes or skin necrosis: No Has patient had a PCN reaction that required hospitalization No Has patient had a PCN reaction occurring within the last 10 years: No If all of the above answers are "NO", then may proceed with Cephalosporin use.  [name] Percocet [Oxycodone-Acetaminophen] Nausea And Vomiting   Follow-up Information    Sharilyn Sites, MD. Schedule an appointment as soon as possible for a visit in 10 day(s).   Specialty: Family Medicine Contact information: [address] [place] [address] [phone]        Constance Haw, MD .   Specialty: Cardiology Contact information: [address] [place] [address] [phone]        Herminio Commons, MD .   Specialty: Cardiology Contact information: [place] [address] [phone]           The results of significant diagnostics from this hospitalization (including imaging, microbiology, ancillary and laboratory) are listed below for reference.    Significant Diagnostic Studies: DG Chest 2 View  Result Date: 08/24/2019 CLINICAL DATA:  Dyspnea. EXAM: CHEST - 2 VIEW COMPARISON:  August 24, 2018 FINDINGS: There is stable mild cardiomegaly and atrioventricular cardiac pacemaker with left-sided battery pack. Mild pulmonary edema and trace bilateral pleural effusions have developed. A small amount of pleural fluid layers along the minor fissure. Streaky consolidation in the medial lung bases is likely atelectatic. There are moderate skeletal degenerative changes and upper abdominal surgical clips and diffuse bone demineralization. IMPRESSION: Stable mild  cardiomegaly and pacemaker with mild pulmonary edema and bibasilar trace pleural effusion and compressive atelectasis. Electronically Signed   By: Revonda Humphrey   On: 08/24/2019 19:27   ECHOCARDIOGRAM COMPLETE  Result Date: 08/26/2019    ECHOCARDIOGRAM REPORT   Patient Name:   Virginia Huber Date of Exam: 08/26/2019 Medical Rec #:  [id number]       Height:       63.0 in Accession #:    [id number]      Weight:       152.6 lb Date of Birth:  [birth year]      BSA:          1.724 m Patient Age:    29 years        BP:           116/79 mmHg Patient Gender: F               HR:           68 bpm. Exam Location:  [facility] Procedure: 2D Echo Indications:    CHF-Acute Diastolic 174.94 / W96.75  History:        Patient has prior history of Echocardiogram examinations, most  recent 08/04/2017. CHF, Previous Myocardial Infarction,                 Arrythmias:Atrial Fibrillation; Risk Factors:Dyslipidemia,                 Hypertension, Diabetes and Non-Smoker. GERD, Pericardial                 Effusion, Tachy-brady syndrome , Tamponade.  Sonographer:    Leavy Cella RDCS (AE) Referring Phys: [place]  1. Left ventricular ejection fraction, by estimation, is 55 to 60%. The left ventricle has normal function. The left ventricle has no regional wall motion abnormalities. There is moderate concentric left ventricular hypertrophy. Left ventricular diastolic function could not be evaluated. Left ventricular diastolic function could not be evaluated. Elevated left ventricular end-diastolic pressure.  2. Right ventricular systolic function is normal. The right ventricular size is normal. There is mildly elevated pulmonary artery systolic pressure.  3. Left atrial size was severely dilated.  4. The mitral valve is normal in structure and function. Trivial mitral valve regurgitation. No evidence of mitral stenosis.  5. The aortic valve is tricuspid. Aortic valve regurgitation is not visualized. No  aortic stenosis is present.  6. The inferior vena cava is normal in size with <50% respiratory variability, suggesting right atrial pressure of 8 mmHg. FINDINGS  Left Ventricle: Left ventricular ejection fraction, by estimation, is 55 to 60%. The left ventricle has normal function. The left ventricle has no regional wall motion abnormalities. The left ventricular internal cavity size was normal in size. There is  moderate concentric left ventricular hypertrophy. Left ventricular diastolic function could not be evaluated due to atrial fibrillation. Left ventricular diastolic function could not be evaluated. Elevated left ventricular end-diastolic pressure. Right Ventricle: The right ventricular size is normal. No increase in right ventricular wall thickness. Right ventricular systolic function is normal. There is mildly elevated pulmonary artery systolic pressure. The tricuspid regurgitant velocity is 2.56  m/s, and with an assumed right atrial pressure of 8 mmHg, the estimated right ventricular systolic pressure is 14.4 mmHg. Left Atrium: Left atrial size was severely dilated. Right Atrium: Right atrial size was normal in size. Pericardium: There is no evidence of pericardial effusion. Mitral Valve: The mitral valve is normal in structure and function. Mild to moderate mitral annular calcification. Trivial mitral valve regurgitation. No evidence of mitral valve stenosis. Tricuspid Valve: The tricuspid valve is normal in structure. Tricuspid valve regurgitation is trivial. No evidence of tricuspid stenosis. Aortic Valve: The aortic valve is tricuspid. Aortic valve regurgitation is not visualized. No aortic stenosis is present. Pulmonic Valve: The pulmonic valve was grossly normal. Pulmonic valve regurgitation is not visualized. No evidence of pulmonic stenosis. Aorta: The aortic root and ascending aorta are structurally normal, with no evidence of dilitation. Venous: The inferior vena cava is normal in size with less  than 50% respiratory variability, suggesting right atrial pressure of 8 mmHg. IAS/Shunts: No atrial level shunt detected by color flow Doppler. Additional Comments: A pacer wire is visualized.  LEFT VENTRICLE PLAX 2D LVIDd:         4.23 cm  Diastology LVIDs:         3.03 cm  LV e' lateral:   5.57 cm/s LV PW:         1.50 cm  LV E/e' lateral: 21.5 LV IVS:        1.88 cm  LV e' medial:    3.78 cm/s LVOT diam:  1.90 cm  LV E/e' medial:  31.7 LVOT Area:     2.84 cm  RIGHT VENTRICLE RV S prime:     7.71 cm/s TAPSE (M-mode): 2.2 cm LEFT ATRIUM              Index       RIGHT ATRIUM           Index LA diam:        4.30 cm  2.49 cm/m  RA Area:     11.20 cm LA Vol (A2C):   103.0 ml 59.76 ml/m RA Volume:   20.90 ml  12.13 ml/m LA Vol (A4C):   79.8 ml  46.30 ml/m LA Biplane Vol: 90.9 ml  52.74 ml/m   AORTA Ao Root diam: 2.90 cm MITRAL VALVE                TRICUSPID VALVE MV Area (PHT): 3.42 cm     TR Peak grad:   26.2 mmHg MV Decel Time: 222 msec     TR Vmax:        256.00 cm/s MV E velocity: 120.00 cm/s MV A velocity: 41.60 cm/s   SHUNTS MV E/A ratio:  2.88         Systemic Diam: 1.90 cm Buford Dresser MD Electronically signed by Buford Dresser MD Signature Date/Time: 08/26/2019/1:16:31 PM    Final     Microbiology: Recent Results (from the past 240 hour(s))  SARS CORONAVIRUS 2 (TAT 6-24 HRS) Nasopharyngeal Nasopharyngeal Swab     Status: None   Collection Time: 08/24/19 10:00 PM   Specimen: Nasopharyngeal Swab  Result Value Ref Range Status   SARS Coronavirus 2 NEGATIVE NEGATIVE Final    Comment: (NOTE) SARS-CoV-2 target nucleic acids are NOT DETECTED. The SARS-CoV-2 RNA is generally detectable in upper and lower respiratory specimens during the acute phase of infection. Negative results do not preclude SARS-CoV-2 infection, do not rule out co-infections with other pathogens, and should not be used as the sole basis for treatment or other patient management decisions. Negative results  must be combined with clinical observations, patient history, and epidemiological information. The expected result is Negative. Fact Sheet for Patients: SugarRoll.be Fact Sheet for Healthcare Providers: https://www.woods-mathews.com/ This test is not yet approved or cleared by the [place] FDA and  has been authorized for detection and/or diagnosis of SARS-CoV-2 by FDA under an Emergency Use Authorization (EUA). This EUA will remain  in effect (meaning this test can be used) for the duration of the COVID-19 declaration under Section 56 4(b)(1) of the Act, 21 U.S.C. section 360bbb-3(b)(1), unless the authorization is terminated or revoked sooner. Performed at [facility], [employer] [address], [place], [place] [address]      Labs: Basic Metabolic Panel: Recent Labs  Lab 08/24/19 1934 08/25/19 0412 08/26/19 0901  NA 140 142 140  K 3.3* 3.5 4.2  CL 106 107 105  CO2 25 25 26   GLUCOSE 119* 131* 225*  BUN 23 24* 29*  CREATININE 1.53* 1.40* 1.52*  CALCIUM 8.9 8.3* 8.1*   Liver Function Tests: Recent Labs  Lab 08/25/19 0412  AST 13*  ALT 18  ALKPHOS 70  BILITOT 0.9  PROT 5.7*  ALBUMIN 3.0*   CBC: Recent Labs  Lab 08/24/19 1934 08/25/19 0412  WBC 10.7* 7.9  HGB 11.2* 9.8*  HCT 35.0* 31.0*  MCV 95.1 95.4  PLT 286 259   BNP (last 3 results) Recent Labs    08/24/19 1934  BNP 547.0*  CBG: Recent Labs  Lab 08/25/19 1105 08/25/19 1601 08/25/19 2132 08/26/19 0801 08/26/19 1133  GLUCAP 126* 143* 87 143* 124*    Signed:  Barton Dubois MD.  [facility] 08/26/2019, 2:45 PM

## 2019-08-26 NOTE — Plan of Care (Signed)

## 2019-08-30 ENCOUNTER — Other Ambulatory Visit: Payer: Self-pay

## 2019-08-30 ENCOUNTER — Ambulatory Visit (INDEPENDENT_AMBULATORY_CARE_PROVIDER_SITE_OTHER): Payer: PPO | Admitting: Student

## 2019-08-30 ENCOUNTER — Encounter: Payer: Self-pay | Admitting: Student

## 2019-08-30 VITALS — BP 138/78 | HR 69 | Temp 97.2°F | Ht 63.0 in | Wt 153.0 lb

## 2019-08-30 DIAGNOSIS — I480 Paroxysmal atrial fibrillation: Secondary | ICD-10-CM | POA: Diagnosis not present

## 2019-08-30 DIAGNOSIS — I10 Essential (primary) hypertension: Secondary | ICD-10-CM | POA: Diagnosis not present

## 2019-08-30 DIAGNOSIS — Z79899 Other long term (current) drug therapy: Secondary | ICD-10-CM

## 2019-08-30 DIAGNOSIS — I5032 Chronic diastolic (congestive) heart failure: Secondary | ICD-10-CM

## 2019-08-30 DIAGNOSIS — I2510 Atherosclerotic heart disease of native coronary artery without angina pectoris: Secondary | ICD-10-CM

## 2019-08-30 DIAGNOSIS — E785 Hyperlipidemia, unspecified: Secondary | ICD-10-CM

## 2019-08-30 DIAGNOSIS — I495 Sick sinus syndrome: Secondary | ICD-10-CM

## 2019-08-30 MED ORDER — FUROSEMIDE 40 MG PO TABS: ORAL_TABLET | ORAL | 4 refills | Status: DC

## 2019-08-30 NOTE — Patient Instructions (Signed)
Medication Instructions:  Your physician recommends that you continue on your current medications as directed. Please refer to the Current Medication list given to you today.    I refilled your Lasix today    *If you need a refill on your cardiac medications before your next appointment, please call your pharmacy*   Lab Work: BMET next week  If you have labs (blood work) drawn today and your tests are completely normal, you will receive your results only by: [name] MyChart Message (if you have MyChart) OR . A paper copy in the mail If you have any lab test that is abnormal or we need to change your treatment, we will call you to review the results.   Testing/Procedures: None today   Follow-Up: At [facility], you and your health needs are our priority.  As part of our continuing mission to provide you with exceptional heart care, we have created designated Provider Care Teams.  These Care Teams include your primary Cardiologist (physician) and Advanced Practice Providers (APPs -  Physician Assistants and Nurse Practitioners) who all work together to provide you with the care you need, when you need it.  We recommend signing up for the patient portal called "MyChart".  Sign up information is provided on this After Visit Summary.  MyChart is used to connect with patients for Virtual Visits (Telemedicine).  Patients are able to view lab/test results, encounter notes, upcoming appointments, etc.  Non-urgent messages can be sent to your provider as well.   To learn more about what you can do with MyChart, go to [email or web address].    Your next appointment:   2-3 month(s)  The format for your next appointment:   In Person  Provider:   Kate Sable, MD   Other Instructions None       Thank you for choosing [facility] !

## 2019-08-30 NOTE — Progress Notes (Signed)
Cardiology Office Note    Date:  08/30/2019   ID:  Virginia, Huber February 22, 1946, MRN [id number]  PCP:  Sharilyn Sites, MD  Cardiologist: Kate Sable, MD   EP: Dr. Curt Bears  Chief Complaint  Patient presents with  . Hospitalization Follow-up    History of Present Illness:    Virginia Huber is a 74 y.o. female with past medical history of CAD (s/p PTCA of OM1 in 2016, DES to LCx in 2016, DES to RCA in 2017), chronic diastolic CHF, PAF (on Eliquis, s/p DCCV in 11/2017), tachy-brady syndrome (s/p Medtronic PPM placement in 2018), OSA (intolerant to CPAP), HTN, HLD, and IDDM who presents to the office today for hospital follow-up.   She was last examined by Dr. Bronson Ing in 12/2018 and denied any recent chest pain, palpitations or dyspnea at that time. She was continued on Amiodarone 100mg  daily, Atorvastatin 80mg  daily, Coreg 6.25mg  BID, Plavix 75mg  daily, Cardizem CD 360mg  daily, Eliquis 5mg  BID, Lasix 40mg  BID, Losartan 100mg  daily and K-dur 20 mEq daily. Was evaluated by Dr. Curt Bears most recently in 07/2019 and denied any symptoms at that time. Her device was interrogated and functioning normally. BP was elevated to 150/78 and Coreg was increased to 25 mg twice daily.  She called the office on 08/24/2019 reporting worsening swelling and oxygen saturations had been in the 80's. Given her hypoxia, it was recommended she go to the ED for further evaluation. HS Troponin values were flat at 19. BNP was elevated to 547 and CXR showed cardiomegaly with bibasilar trace pleural effusion and atelectasis. She responded well to IV Lasix and was transitioned to PO Lasix 60mg  in AM/40mg  in PM upon discharge. Last weight recorded was 152 lbs. Creatinine was stable at 1.52 at the time of discharge.   In talking with the patient today, she reports feeling fatigued upon returning home but says this continues to improve. Breathing has overall been stable and weight has declined to 152 lbs on her home  scales. She denies any recurrent orthopnea, PND or lower extremity edema. Oxygen saturations have been remaining in the 90's.  She did have brief episodes of atrial fibrillation during admission and says she had palpitations at that time but denies any recurrent symptoms since hospital discharge.  No recent chest pain, dizziness or presyncope.  Past Medical History:  Diagnosis Date  . Arthritis   . Atrial fibrillation and flutter ([place])    a. h/o PAF/flutter during admission in 2013 for PNA. b. PAF during adm for NSTEMI 07/2015, subsequent paroxysms since then.  . B12 deficiency anemia   . Cardiac tamponade 06/2016  . CHF (congestive heart failure) ([place])   . Coronary artery disease 11/30/2014   a. remote MI. b. h/o PTCA with scoring balloon to OM1 11/2014. c. NSTEMI 03/2015 s/p DES to prox-mid Cx. d. NSTEMI 07/2015 s/p scoring balloon/PTCA/DES to dRCA with PAF during that admission  . Cutaneous lupus erythematosus   . GERD (gastroesophageal reflux disease)   . Heart block   . History of blood transfusion 1980's   2nd surgical procedures  . HTN (hypertension)   . Hypercholesteremia   . Hypothyroidism   . Myocardial infarction ([place]) 02/2012  . Ovarian tumor   . PAD (peripheral artery disease) ([place])    a. s/p LE angio 2015; followed by Dr. Fletcher Anon - managed medically.  . Pain with urination 05/08/2015  . Paroxysmal atrial flutter ([place])   . Pericardial effusion    a. 06/2016 after ppm -  s/p pericardiocentesis.  . Superficial fungus infection of skin 06/29/2013  . Tachy-brady syndrome ([place])    a. s/p Medtronic PPM 06/2016, c/b lead perf/pericardial effusion.  [name] TIA (transient ischemic attack) 08/2001; ~ 2006  . Type II diabetes mellitus ([place])   . UTI (urinary tract infection) 05/08/2013    Past Surgical History:  Procedure Laterality Date  . ABDOMINAL AORTAGRAM N/A 01/03/2014   Procedure: ABDOMINAL [name];  Surgeon: Wellington Hampshire, MD;  Location: [facility] CATH LAB;  Service: Cardiovascular;   Laterality: N/A;  . ABDOMINAL HYSTERECTOMY  1972   "partial"  . APPENDECTOMY  1970's  . CARDIAC CATHETERIZATION  2008   Tiny OM-2 with 90% narrowing. Med tx.  [name] CARDIAC CATHETERIZATION N/A 11/30/2014   Procedure: Left Heart Cath and Coronary Angiography;  Surgeon: Troy Sine, MD; LAD 20%, CFX 50%, OM1 95%, right PLB 30%, LV normal   . CARDIAC CATHETERIZATION N/A 11/30/2014   Procedure: Coronary Balloon Angioplasty;  Surgeon: Troy Sine, MD;  Angiosculpt scoring balloon and PTCA to the OM1 reducing stenosis from 95% to less than 10%  . CARDIAC CATHETERIZATION N/A 04/03/2015   Procedure: Left Heart Cath and Coronary Angiography;  Surgeon: Jolaine Artist, MD; dLAD 50%, CFX 90%, OM1 100%, PLA 15%, LVEDP 13    . CARDIAC CATHETERIZATION N/A 04/03/2015   Procedure: Coronary Stent Intervention;  Surgeon: Sherren Mocha, MD; 3.0x18 mm Xience DES to the CFX    . CARDIAC CATHETERIZATION N/A 08/02/2015   Procedure: Left Heart Cath and Coronary Angiography;  Surgeon: Troy Sine, MD;  Location: [facility] CV LAB;  Service: Cardiovascular;  Laterality: N/A;  . CARDIAC CATHETERIZATION N/A 08/02/2015   Procedure: Coronary Stent Intervention;  Surgeon: Troy Sine, MD;  Location: [facility];  Service: Cardiovascular;  Laterality: N/A;  . CARDIAC CATHETERIZATION N/A 06/25/2016   Procedure: Pericardiocentesis;  Surgeon: Will Meredith Leeds, MD;  Location: [facility];  Service: Cardiovascular;  Laterality: N/A;  . cardiac stents    . CARDIOVERSION N/A 12/15/2017   Procedure: CARDIOVERSION;  Surgeon: Acie Fredrickson Wonda Cheng, MD;  Location[facility] ENDOSCOPY;  Service: Cardiovascular;  Laterality: N/A;  . CHOLECYSTECTOMY OPEN  1990's  . COLONOSCOPY  2005   Dr. Laural Golden: pancolonic divericula, polyp, path unknown currently  . COLONOSCOPY  2012   Dr. Oneida Alar: Normal TI, scattered diverticula in entire colon, small internal hemorrhoids, normal colon biopsies. Colonoscopy in 5-10 years.   . COLOSTOMY   05/1979  . COLOSTOMY REVERSAL  11/1979  . EP IMPLANTABLE DEVICE N/A 06/25/2016   Procedure: Lead Revision/Repair;  Surgeon: Will Meredith Leeds, MD;  Location: [facility] CV LAB;  Service: Cardiovascular;  Laterality: N/A;  . EP IMPLANTABLE DEVICE N/A 06/25/2016   Procedure: Pacemaker Implant;  Surgeon: Will Meredith Leeds, MD;  Location: [facility] CV LAB;  Service: Cardiovascular;  Laterality: N/A;  . EXCISIONAL HEMORRHOIDECTOMY  1970's  . EYE SURGERY Left 2000   "branch vein occlusion"  . EYE SURGERY Left ~ 2001   "smoothed out wrinkle"  . LEFT OOPHORECTOMY  05/1979   nicked bowel, peritonitis, colostomy; colostomy reversed 1981   . LOWER EXTREMITY ANGIOGRAM N/A 01/03/2014   Procedure: LOWER EXTREMITY ANGIOGRAM;  Surgeon: Wellington Hampshire, MD;  Location: [facility] CATH LAB;  Service: Cardiovascular;  Laterality: N/A;  . Nuclear med stress test  10/2011   Small area of mild ischemia inferoapically.  [name] PARTIAL HYSTERECTOMY  1970's   left ovaries, then ovaries removed later due tumors   . RIGHT OOPHORECTOMY  1970's    Current Medications: Outpatient Medications Prior to Visit  Medication Sig Dispense Refill  . acetaminophen (TYLENOL) 325 MG tablet Take 650 mg by mouth every 6 (six) hours as needed for headache.    [name] amiodarone (PACERONE) 200 MG tablet Take 0.5 tablets (100 mg total) by mouth daily. 45 tablet 1  . apixaban (ELIQUIS) 5 MG TABS tablet Take 1 tablet (5 mg total) by mouth 2 (two) times daily. 28 tablet 0  . atorvastatin (LIPITOR) 80 MG tablet TAKE ONE TABLET BY MOUTH EVERY DAY AT 6:00PM 90 tablet 3  . carvedilol (COREG) 25 MG tablet Take 1 tablet (25 mg total) by mouth 2 (two) times daily. 90 tablet 1  . clopidogrel (PLAVIX) 75 MG tablet TAKE ONE TABLET (75MG  TOTAL) BY MOUTH DAILY 90 tablet 3  . cyanocobalamin (,VITAMIN B-12,) 1000 MCG/ML injection Inject 1,000 mcg into the muscle every 30 (thirty) days. Last injection was around 08/02/19    . diltiazem (CARDIZEM CD) 360 MG 24 hr  capsule Take 1 capsule (360 mg total) by mouth daily. 90 capsule 3  . insulin NPH-regular Human (NOVOLIN 70/30) (70-30) 100 UNIT/ML injection Inject 40 Units into the skin See admin instructions. Inject 25 units before breakfast and 25 units before supper  - only if pre-meal blood glucose is above 90 mg/dL. (Patient taking differently: Inject 40 Units into the skin See admin instructions. Inject 30 units before breakfast and 30 units before supper  - only if pre-meal blood glucose is above 90 mg/dL.) 10 mL 11  . levothyroxine (SYNTHROID) 137 MCG tablet Take 137 mcg by mouth daily before breakfast.    . nitroGLYCERIN (NITROSTAT) 0.4 MG SL tablet Place 1 tablet (0.4 mg total) under the tongue every 5 (five) minutes as needed for chest pain. 25 tablet 0  . olmesartan (BENICAR) 40 MG tablet Take 40 mg by mouth daily.    . potassium chloride SA (KLOR-CON) 20 MEQ tablet TAKE ONE (1) TABLET BY MOUTH EVERY DAY 90 tablet 1  . rOPINIRole (REQUIP) 0.5 MG tablet Take 0.5 mg by mouth at bedtime.   0  . Vitamin D, Ergocalciferol, (DRISDOL) 1.25 MG (50000 UNIT) CAPS capsule Take 50,000 Units by mouth every 7 (seven) days. On tuesdays    . furosemide (LASIX) 40 MG tablet TAKE ONE AND HALF TABLET (60MG  TOTAL) BY MOUTH IN THE MORNING AND ONE TABLET IN THE EVENING (40MG  TOTAL) 180 tablet 4   No facility-administered medications prior to visit.     Allergies:   Penicillins and Percocet [oxycodone-acetaminophen]   Social History   Socioeconomic History  . Marital status: Married    Spouse name: Not on file  . Number of children: Not on file  . Years of education: Not on file  . Highest education level: Not on file  Occupational History  . Occupation: Retired    Fish farm manager: RETIRED    Comment: Research officer, political party  Tobacco Use  . Smoking status: Never Smoker  . Smokeless tobacco: Never Used  . Tobacco comment: Never smoked  Substance and Sexual Activity  . Alcohol use: No    Alcohol/week: 0.0 standard drinks    . Drug use: No  . Sexual activity: Never    Birth control/protection: Surgical    Comment: hyst  Other Topics Concern  . Not on file  Social History Narrative  . Not on file   Social Determinants of Health   Financial Resource Strain:   . Difficulty of Paying Living Expenses: Not on file  Food Insecurity:   . Worried About Charity fundraiser in the Last Year: Not on file  . Ran Out of Food in the Last Year: Not on file  Transportation Needs:   . Lack of Transportation (Medical): Not on file  . Lack of Transportation (Non-Medical): Not on file  Physical Activity:   . Days of Exercise per Week: Not on file  . Minutes of Exercise per Session: Not on file  Stress:   . Feeling of Stress : Not on file  Social Connections:   . Frequency of Communication with Friends and Family: Not on file  . Frequency of Social Gatherings with Friends and Family: Not on file  . Attends Religious Services: Not on file  . Active Member of Clubs or Organizations: Not on file  . Attends Archivist Meetings: Not on file  . Marital Status: Not on file     Family History:  The patient's family history includes Diabetes in her brother; Heart disease in her brother, brother, father, mother, and sister; Lupus in her daughter; Thyroid disease in her brother and brother.   Review of Systems:   Please see the history of present illness.     General:  No chills, fever, night sweats or weight changes.  Cardiovascular:  No chest pain, orthopnea, palpitations, paroxysmal nocturnal dyspnea. Positive for dyspnea on exertion and edema (improved).  Dermatological: No rash, lesions/masses Respiratory: No cough, dyspnea Urologic: No hematuria, dysuria Abdominal:   No nausea, vomiting, diarrhea, bright red blood per rectum, melena, or hematemesis Neurologic:  No visual changes, wkns, changes in mental status. All other systems reviewed and are otherwise negative except as noted above.   Physical Exam:     VS:  BP 138/78   Pulse 69   Temp (!) 97.2 F (36.2 C)   Ht 5\' 3"  (1.6 m)   Wt 153 lb (69.4 kg)   SpO2 96%   BMI 27.10 kg/m    General: Well developed, well nourished,female appearing in no acute distress. Head: Normocephalic, atraumatic, sclera non-icteric.  Neck: No carotid bruits. JVD not elevated.  Lungs: Respirations regular and unlabored, without wheezes or rales.  Heart: Regular rate and rhythm. No S3 or S4.  No murmur, no rubs, or gallops appreciated. Abdomen: Soft, non-tender, non-distended. No obvious abdominal masses. Msk:  Strength and tone appear normal for age. No obvious joint deformities or effusions. Extremities: No clubbing or cyanosis. No lower extremity edema.  Distal pedal pulses are 2+ bilaterally. Neuro: Alert and oriented X 3. Moves all extremities spontaneously. No focal deficits noted. Psych:  Responds to questions appropriately with a normal affect. Skin: No rashes or lesions noted  Wt Readings from Last 3 Encounters:  08/30/19 153 lb (69.4 kg)  08/24/19 152 lb 9.6 oz (69.2 kg)  08/15/19 152 lb 9.6 oz (69.2 kg)     Studies/Labs Reviewed:   EKG:  EKG is not ordered today. EKG from 08/24/2019 is reviewed and shows A-paced rhythm, HR 62 with LAD.   Recent Labs: 08/15/2019: TSH 0.424 08/24/2019: B Natriuretic Peptide 547.0 08/25/2019: ALT 18; Hemoglobin 9.8; Platelets 259 08/26/2019: BUN 29; Creatinine, Ser 1.52; Potassium 4.2; Sodium 140   Lipid Panel    Component Value Date/Time   CHOL 166 08/05/2017 0807   TRIG 160 (H) 08/05/2017 0807   HDL 43 (L) 08/05/2017 0807   CHOLHDL 3.9 08/05/2017 0807   VLDL NOT CALC 04/14/2016 0910   LDLCALC 96 08/05/2017 0807    Additional studies/ records that  were reviewed today include:   NST: 01/2018  Downsloping ST segment depression ST segment depression of 0.5 mm was noted during stress in the II, III, aVF, V5 and V6 leads.  Defect 1: There is a small defect of mild severity present in the apical anterior and  apical septal location. This likely represents soft tissue attenuation. A small degree of scar cannot entirely be excluded. No signficant ischemia.  This is a low risk study.  Nuclear stress EF: 64%.  Echocardiogram: 08/2019 IMPRESSIONS    1. Left ventricular ejection fraction, by estimation, is 55 to 60%. The  left ventricle has normal function. The left ventricle has no regional  wall motion abnormalities. There is moderate concentric left ventricular  hypertrophy. Left ventricular  diastolic function could not be evaluated. Left ventricular diastolic  function could not be evaluated. Elevated left ventricular end-diastolic  pressure.  2. Right ventricular systolic function is normal. The right ventricular  size is normal. There is mildly elevated pulmonary artery systolic  pressure.  3. Left atrial size was severely dilated.  4. The mitral valve is normal in structure and function. Trivial mitral  valve regurgitation. No evidence of mitral stenosis.  5. The aortic valve is tricuspid. Aortic valve regurgitation is not  visualized. No aortic stenosis is present.  6. The inferior vena cava is normal in size with <50% respiratory  variability, suggesting right atrial pressure of 8 mmHg.   Assessment:    1. Chronic diastolic heart failure ([place])   2. Coronary artery disease involving native coronary artery of native heart without angina pectoris   3. PAF (paroxysmal atrial fibrillation) ([place])   4. Tachy-brady syndrome ([place])   5. Essential hypertension   6. Hyperlipidemia LDL goal <70   7. Medication management      Plan:   In order of problems listed above:  1. Chronic Diastolic CHF - She was recently admitted for an acute CHF exacerbation and responded well with IV Lasix. Symptoms have overall been stable since returning home and weight has been at 152 - 153 lbs on her home scales. Lasix was increased from 40mg  BID to 60mg  in AM/40mg  in PM during her recent admission.  Will plan for a repeat BMET next week. Sodium and fluid restriction reviewed.  2. CAD - s/p PTCA of OM1 in 2016, DES to LCx in 2016 and DES to RCA in 2017. Troponin values were negative during her recent admission and echocardiogram at that time showed a preserved EF with no regional wall motion abnormalities. - She denies any recent anginal symptoms her breathing has returned to baseline following diuresis. - continue Plavix 75mg  daily, Coreg 25mg  BID and Atorvastatin 80mg  daily.   3. Paroxysmal Atrial Fibrillation - She had a low atrial fibrillation burden by her recent device check but CV strips during her recent admission did show episodes of atrial fibrillation at that time. She is in normal sinus rhythm by examination today and denies any recurrent symptoms since hospital discharge. Continue Amiodarone 100 mg daily along with Coreg 25 mg twice daily and Cardizem CD 360 mg daily. TSH was checked last month and low at 0.424, therefore she has follow-up with her PCP next week to further discuss.  - remains on Eliquis 5mg  BID for anticoagulation.   4. Tachy-brady syndrome - s/p Medtronic PPM placement in 2018. Her device is followed by Dr. Curt Bears.  5. HTN - BP initially elevated at 149/64 upon walking into the office, improved to 138/78 on recheck. Continue current  regimen at this time including Coreg 25 mg twice daily, Cardizem CD 360 mg daily and Olmesartan 40 mg daily.  6. HLD - followed by PCP. She remains on Atorvastatin 80mg  daily with goal LDL less than 70 with known CAD.    Medication Adjustments/Labs and Tests Ordered: Current medicines are reviewed at length with the patient today.  Concerns regarding medicines are outlined above.  Medication changes, Labs and Tests ordered today are listed in the Patient Instructions below. Patient Instructions  Medication Instructions:  Your physician recommends that you continue on your current medications as directed. Please refer to the  Current Medication list given to you today.    I refilled your Lasix today    *If you need a refill on your cardiac medications before your next appointment, please call your pharmacy*   Lab Work: BMET next week  If you have labs (blood work) drawn today and your tests are completely normal, you will receive your results only by: [name] MyChart Message (if you have MyChart) OR . A paper copy in the mail If you have any lab test that is abnormal or we need to change your treatment, we will call you to review the results.   Testing/Procedures: None today   Follow-Up: At [facility], you and your health needs are our priority.  As part of our continuing mission to provide you with exceptional heart care, we have created designated Provider Care Teams.  These Care Teams include your primary Cardiologist (physician) and Advanced Practice Providers (APPs -  Physician Assistants and Nurse Practitioners) who all work together to provide you with the care you need, when you need it.  We recommend signing up for the patient portal called "MyChart".  Sign up information is provided on this After Visit Summary.  MyChart is used to connect with patients for Virtual Visits (Telemedicine).  Patients are able to view lab/test results, encounter notes, upcoming appointments, etc.  Non-urgent messages can be sent to your provider as well.   To learn more about what you can do with MyChart, go to [email or web address].    Your next appointment:   2-3 month(s)  The format for your next appointment:   In Person  Provider:   Kate Sable, MD   Other Instructions None   Thank you for choosing [facility] !        Signed, Erma Heritage, PA-C  08/30/2019 5:03 PM    [place] [address] [place], [place] [address] Phone: [phone] Fax: [phone]

## 2019-08-31 DIAGNOSIS — I130 Hypertensive heart and chronic kidney disease with heart failure and stage 1 through stage 4 chronic kidney disease, or unspecified chronic kidney disease: Secondary | ICD-10-CM | POA: Diagnosis not present

## 2019-08-31 DIAGNOSIS — E261 Secondary hyperaldosteronism: Secondary | ICD-10-CM | POA: Diagnosis not present

## 2019-08-31 DIAGNOSIS — N183 Chronic kidney disease, stage 3 unspecified: Secondary | ICD-10-CM | POA: Diagnosis not present

## 2019-08-31 DIAGNOSIS — E1165 Type 2 diabetes mellitus with hyperglycemia: Secondary | ICD-10-CM | POA: Diagnosis not present

## 2019-08-31 DIAGNOSIS — I509 Heart failure, unspecified: Secondary | ICD-10-CM | POA: Diagnosis not present

## 2019-08-31 DIAGNOSIS — I25118 Atherosclerotic heart disease of native coronary artery with other forms of angina pectoris: Secondary | ICD-10-CM | POA: Diagnosis not present

## 2019-08-31 DIAGNOSIS — E1151 Type 2 diabetes mellitus with diabetic peripheral angiopathy without gangrene: Secondary | ICD-10-CM | POA: Diagnosis not present

## 2019-08-31 DIAGNOSIS — G4733 Obstructive sleep apnea (adult) (pediatric): Secondary | ICD-10-CM | POA: Diagnosis not present

## 2019-08-31 DIAGNOSIS — E1122 Type 2 diabetes mellitus with diabetic chronic kidney disease: Secondary | ICD-10-CM | POA: Diagnosis not present

## 2019-08-31 DIAGNOSIS — E1159 Type 2 diabetes mellitus with other circulatory complications: Secondary | ICD-10-CM | POA: Diagnosis not present

## 2019-08-31 DIAGNOSIS — E039 Hypothyroidism, unspecified: Secondary | ICD-10-CM | POA: Diagnosis not present

## 2019-08-31 DIAGNOSIS — N2581 Secondary hyperparathyroidism of renal origin: Secondary | ICD-10-CM | POA: Diagnosis not present

## 2019-09-04 DIAGNOSIS — I509 Heart failure, unspecified: Secondary | ICD-10-CM | POA: Diagnosis not present

## 2019-09-04 DIAGNOSIS — Z681 Body mass index (BMI) 19 or less, adult: Secondary | ICD-10-CM | POA: Diagnosis not present

## 2019-09-04 DIAGNOSIS — D649 Anemia, unspecified: Secondary | ICD-10-CM | POA: Diagnosis not present

## 2019-09-04 DIAGNOSIS — N183 Chronic kidney disease, stage 3 unspecified: Secondary | ICD-10-CM | POA: Diagnosis not present

## 2019-09-04 DIAGNOSIS — Z1389 Encounter for screening for other disorder: Secondary | ICD-10-CM | POA: Diagnosis not present

## 2019-09-07 DIAGNOSIS — E113411 Type 2 diabetes mellitus with severe nonproliferative diabetic retinopathy with macular edema, right eye: Secondary | ICD-10-CM | POA: Diagnosis not present

## 2019-09-07 DIAGNOSIS — H34831 Tributary (branch) retinal vein occlusion, right eye, with macular edema: Secondary | ICD-10-CM | POA: Diagnosis not present

## 2019-09-07 DIAGNOSIS — H35041 Retinal micro-aneurysms, unspecified, right eye: Secondary | ICD-10-CM | POA: Diagnosis not present

## 2019-09-07 DIAGNOSIS — H348322 Tributary (branch) retinal vein occlusion, left eye, stable: Secondary | ICD-10-CM | POA: Diagnosis not present

## 2019-09-19 ENCOUNTER — Encounter: Payer: Self-pay | Admitting: Sports Medicine

## 2019-09-19 ENCOUNTER — Ambulatory Visit: Payer: PPO | Admitting: Sports Medicine

## 2019-09-19 ENCOUNTER — Other Ambulatory Visit: Payer: Self-pay

## 2019-09-19 VITALS — Temp 97.7°F

## 2019-09-19 DIAGNOSIS — Z7901 Long term (current) use of anticoagulants: Secondary | ICD-10-CM

## 2019-09-19 DIAGNOSIS — E119 Type 2 diabetes mellitus without complications: Secondary | ICD-10-CM | POA: Diagnosis not present

## 2019-09-19 DIAGNOSIS — M79674 Pain in right toe(s): Secondary | ICD-10-CM | POA: Diagnosis not present

## 2019-09-19 DIAGNOSIS — M79675 Pain in left toe(s): Secondary | ICD-10-CM | POA: Diagnosis not present

## 2019-09-19 DIAGNOSIS — B351 Tinea unguium: Secondary | ICD-10-CM

## 2019-09-19 NOTE — Progress Notes (Signed)
Subjective: Virginia Huber is a 74 y.o. female patient with history of diabetes who presents to office today complaining of long,mildly painful nails while ambulating in shoes; unable to trim. Patient states that the glucose reading this morning was not recorded. Patient denies any new changes in medication or new problems. Still on blood thinners. No other issues noted.    Patient Active Problem List   Diagnosis Date Noted  . CHF exacerbation ([place]) 08/25/2019  . Acute exacerbation of CHF (congestive heart failure) ([place]) 08/24/2019  . Acute on chronic diastolic HF (heart failure) ([place]) 12/17/2017  . GERD (gastroesophageal reflux disease) 12/17/2017  . Typical atrial flutter ([place])   . 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 Paroxysmal atrial fibrillation ([place]) 12/08/2017  . 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 Chronic renal failure, stage 3b 09/23/2017  . Fever 09/23/2017  . S/P pericardiocentesis 06/28/2016  . Acute blood loss anemia 06/28/2016  . Pericardial effusion 06/26/2016  . Tachy-brady syndrome ([place]) 06/25/2016  . Tamponade   . Bradycardia 06/14/2016  . Junctional bradycardia   . Coronary artery disease involving coronary bypass graft of native heart with unstable angina pectoris ([place])   . Acute diastolic CHF (congestive heart failure), NYHA class 3 ([place])   . Systolic congestive heart failure ([place]) 05/13/2016  . Multiple and bilateral precerebral artery syndromes 05/13/2016  . OSA (obstructive sleep apnea) 05/13/2016  . Chronic diarrhea 12/25/2015  . Chest pain 08/02/2015  . Atrial fibrillation with rapid ventricular response ([place])   . Pain with urination 05/08/2015  . NSTEMI (non-ST elevated myocardial infarction) ([place]) 04/02/2015  . CAD in native artery 11/30/2014  . 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   . PAD (peripheral artery disease) ([place]) 12/26/2013  . Superficial  fungus infection of skin 06/29/2013  . UTI (urinary tract infection) 05/08/2013  . Hypokalemia 03/05/2012  . B12 deficiency anemia 03/02/2012  . Bronchospasm 03/02/2012  . Community acquired bacterial pneumonia 03/01/2012  . Acute respiratory failure with hypoxia ([place]) 03/01/2012  . Type 2 diabetes with nephropathy ([place]) 02/29/2012  . Hypothyroidism 02/28/2012  . RLQ abdominal pain 11/24/2010  . OVERWEIGHT/OBESITY 06/03/2010  . Essential hypertension 06/03/2010  . Mixed hyperlipidemia 12/27/2009  . Palpitations 05/17/2009  . FRACTURE, TOE 12/06/2007   Current Outpatient Medications on File Prior to Visit  Medication Sig Dispense Refill  . acetaminophen (TYLENOL) 325 MG tablet Take 650 mg by mouth every 6 (six) hours as needed for headache.    [name] amiodarone (PACERONE) 200 MG tablet Take 0.5 tablets (100 mg total) by mouth daily. 45 tablet 1  . apixaban (ELIQUIS) 5 MG TABS tablet Take 1 tablet (5 mg total) by mouth 2 (two) times daily. 28 tablet 0  . atorvastatin (LIPITOR) 80 MG tablet TAKE ONE TABLET BY MOUTH EVERY DAY AT 6:00PM 90 tablet 3  . carvedilol (COREG) 25 MG tablet Take 1 tablet (25 mg total) by mouth 2 (two) times daily. 90 tablet 1  . clopidogrel (PLAVIX) 75 MG tablet TAKE ONE TABLET (75MG  TOTAL) BY MOUTH DAILY 90 tablet 3  . cyanocobalamin (,VITAMIN B-12,) 1000 MCG/ML injection Inject 1,000 mcg into the muscle every 30 (thirty) days. Last injection was around 08/02/19    . diltiazem (CARDIZEM CD) 360 MG 24 hr capsule Take 1 capsule (360 mg total) by mouth daily. 90 capsule 3  . diltiazem (TIAZAC) 360 MG 24 hr capsule Take 360 mg by mouth daily.    [name]  FREESTYLE LITE test strip USE AS DIRECTED TWICEODAILY.    . furosemide (LASIX) 40 MG tablet TAKE ONE AND HALF TABLET (60MG  TOTAL) BY MOUTH IN THE MORNING AND ONE TABLET IN THE EVENING (40MG  TOTAL) 180 tablet 4  . insulin NPH-regular Human (NOVOLIN 70/30) (70-30) 100 UNIT/ML injection Inject 40 Units into the skin See admin  instructions. Inject 25 units before breakfast and 25 units before supper  - only if pre-meal blood glucose is above 90 mg/dL. (Patient taking differently: Inject 40 Units into the skin See admin instructions. Inject 30 units before breakfast and 30 units before supper  - only if pre-meal blood glucose is above 90 mg/dL.) 10 mL 11  . levothyroxine (SYNTHROID) 137 MCG tablet Take 137 mcg by mouth daily before breakfast.    . nitroGLYCERIN (NITROSTAT) 0.4 MG SL tablet Place 1 tablet (0.4 mg total) under the tongue every 5 (five) minutes as needed for chest pain. 25 tablet 0  . olmesartan (BENICAR) 40 MG tablet Take 40 mg by mouth daily.    . Potassium Chloride ER 20 MEQ TBCR Take 1 tablet by mouth at bedtime.    . potassium chloride SA (KLOR-CON) 20 MEQ tablet TAKE ONE (1) TABLET BY MOUTH EVERY DAY 90 tablet 1  . rOPINIRole (REQUIP) 0.5 MG tablet Take 0.5 mg by mouth at bedtime.   0  . Vitamin D, Ergocalciferol, (DRISDOL) 1.25 MG (50000 UNIT) CAPS capsule Take 50,000 Units by mouth every 7 (seven) days. On tuesdays     No current facility-administered medications on file prior to visit.   Allergies  Allergen Reactions  . Penicillins Hives    Has patient had a PCN reaction causing immediate rash, facial/tongue/throat swelling, SOB or lightheadedness with hypotension: Yes Has patient had a PCN reaction causing severe rash involving mucus membranes or skin necrosis: No Has patient had a PCN reaction that required hospitalization No Has patient had a PCN reaction occurring within the last 10 years: No If all of the above answers are "NO", then may proceed with Cephalosporin use.  [name] Percocet [Oxycodone-Acetaminophen] Nausea And Vomiting     Objective: General: Patient is awake, alert, and oriented x 3 and in no acute distress.  Integument: Skin is warm, dry and supple bilateral. Nails are tender, long, thickened and dystrophic with subungual debris, consistent with onychomycosis, 1-5 bilateral.  Mild curvature of bilateral hallux nails, No signs of infection. No open lesions or preulcerative lesions present bilateral. Remaining integument unremarkable.  Vasculature:  Dorsalis Pedis pulse 1/4 bilateral. Posterior Tibial pulse  0/4 bilateral.  Capillary fill time <3 sec 1-5 bilateral. Positive hair growth to the level of the digits. Temperature gradient within normal limits. No varicosities present bilateral. Trace edema present bilateral.   Neurology: The patient has intact sensation measured with a 5.07/10g Semmes Weinstein Monofilament at all pedal sites bilateral . Vibratory sensation diminished bilateral with tuning fork. No Babinski sign present bilateral.   Musculoskeletal: Asymptomatic bunion and hammertoe with cross over R>L pedal deformities noted bilateral. Muscular strength 5/5 in all lower extremity muscular groups bilateral without pain on range of motion . No tenderness with calf compression bilateral.  Assessment and Plan: Problem List Items Addressed This Visit    None    Visit Diagnoses    Pain due to onychomycosis of toenails of both feet    -  Primary   Diabetes mellitus without complication ([place])       Anticoagulated         -Examined patient. -  Re-Discussed and educated patient on diabetic foot care, especially with  regards to the vascular, neurological and musculoskeletal systems.  -Mechanically debrided all nails 1-5 bilateral using sterile nail nipper and filed with dremel without incident  -Dispensed toe splint for patient to use on right 2nd toe to help with cross over deformity  -Answered all patient questions -Patient to return  in 3 months for at risk foot care -Patient advised to call the office if any problems or questions arise in the meantime.  Landis Martins, DPM

## 2019-10-10 ENCOUNTER — Ambulatory Visit (INDEPENDENT_AMBULATORY_CARE_PROVIDER_SITE_OTHER): Payer: PPO | Admitting: *Deleted

## 2019-10-10 DIAGNOSIS — I495 Sick sinus syndrome: Secondary | ICD-10-CM | POA: Diagnosis not present

## 2019-10-10 LAB — CUP PACEART REMOTE DEVICE CHECK
Battery Remaining Longevity: 85 mo
Battery Voltage: 3.01 V
Brady Statistic AP VP Percent: 0.08 %
Brady Statistic AP VS Percent: 92.39 %
Brady Statistic AS VP Percent: 0.69 %
Brady Statistic AS VS Percent: 6.83 %
Brady Statistic RA Percent Paced: 91.47 %
Brady Statistic RV Percent Paced: 0.77 %
Date Time Interrogation Session: 20210420080957
Implantable Lead Implant Date: 20180104
Implantable Lead Implant Date: 20180104
Implantable Lead Location: 753859
Implantable Lead Location: 753860
Implantable Lead Model: 5076
Implantable Lead Model: 5076
Implantable Pulse Generator Implant Date: 20180104
Lead Channel Impedance Value: 342 Ohm
Lead Channel Impedance Value: 380 Ohm
Lead Channel Impedance Value: 380 Ohm
Lead Channel Impedance Value: 513 Ohm
Lead Channel Pacing Threshold Amplitude: 0.75 V
Lead Channel Pacing Threshold Amplitude: 1.25 V
Lead Channel Pacing Threshold Pulse Width: 0.4 ms
Lead Channel Pacing Threshold Pulse Width: 0.4 ms
Lead Channel Sensing Intrinsic Amplitude: 1.875 mV
Lead Channel Sensing Intrinsic Amplitude: 1.875 mV
Lead Channel Sensing Intrinsic Amplitude: 19.75 mV
Lead Channel Sensing Intrinsic Amplitude: 19.75 mV
Lead Channel Setting Pacing Amplitude: 2 V
Lead Channel Setting Pacing Amplitude: 2.5 V
Lead Channel Setting Pacing Pulse Width: 0.4 ms
Lead Channel Setting Sensing Sensitivity: 2.8 mV

## 2019-10-11 ENCOUNTER — Ambulatory Visit (INDEPENDENT_AMBULATORY_CARE_PROVIDER_SITE_OTHER): Payer: PPO | Admitting: Ophthalmology

## 2019-10-11 ENCOUNTER — Other Ambulatory Visit: Payer: Self-pay

## 2019-10-11 ENCOUNTER — Encounter (INDEPENDENT_AMBULATORY_CARE_PROVIDER_SITE_OTHER): Payer: Self-pay | Admitting: Ophthalmology

## 2019-10-11 DIAGNOSIS — H33301 Unspecified retinal break, right eye: Secondary | ICD-10-CM | POA: Diagnosis not present

## 2019-10-11 DIAGNOSIS — H35041 Retinal micro-aneurysms, unspecified, right eye: Secondary | ICD-10-CM

## 2019-10-11 DIAGNOSIS — E113411 Type 2 diabetes mellitus with severe nonproliferative diabetic retinopathy with macular edema, right eye: Secondary | ICD-10-CM

## 2019-10-11 DIAGNOSIS — H34831 Tributary (branch) retinal vein occlusion, right eye, with macular edema: Secondary | ICD-10-CM

## 2019-10-11 DIAGNOSIS — H43811 Vitreous degeneration, right eye: Secondary | ICD-10-CM | POA: Insufficient documentation

## 2019-10-11 HISTORY — DX: Unspecified retinal break, right eye: H33.301

## 2019-10-11 HISTORY — DX: Tributary (branch) retinal vein occlusion, right eye, with macular edema: H34.8310

## 2019-10-11 HISTORY — DX: Retinal micro-aneurysms, unspecified, right eye: H35.041

## 2019-10-11 HISTORY — DX: Vitreous degeneration, right eye: H43.811

## 2019-10-11 MED ORDER — BEVACIZUMAB CHEMO INJECTION 1.25MG/0.05ML SYRINGE FOR KALEIDOSCOPE
1.2500 mg | INTRAVITREAL | Status: AC | PRN
  Administered 2019-10-11: 12:00:00 1.25 mg via INTRAVITREAL

## 2019-10-11 NOTE — Assessment & Plan Note (Signed)
The nature of severe nonproliferative diabetic retinopathy discussed with the patient as well as the need for more frequent follow up and likely progression to proliferative disease in the near future. The options of continued observation versus panretinal photocoagulation at this time were reviewed as well as the risks, benefits, and alternatives. More recent option includes the use of ocular injectable medications to slow progression of retinal disease. Tight control of glucose, blood pressure, and serum lipid levels were recommended under the direction of general physician or endocrinologist, as well as avoidance of smoking and maintenance of normal body weight. The 2-year risk of progression to proliferative diabetic retinopathy is 60%.  OD stable.

## 2019-10-11 NOTE — Progress Notes (Signed)
10/11/2019     CHIEF COMPLAINT Patient presents for Retina Follow Up   HISTORY OF PRESENT ILLNESS: Virginia Huber is a 74 y.o. female who presents to the clinic today for:   HPI    Retina Follow Up    Patient presents with  CRVO/BRVO.  In right eye.  Duration of 5 weeks.  Since onset it is stable.  I, the attending physician,  performed the HPI with the patient and updated documentation appropriately.          Comments    5 week follow up - OCT OU, Possible Avastin OD,, Patient denies change in vision and overall has no complaints.        Last edited by Hurman Horn, MD on 10/11/2019 11:39 AM. (History)      Referring physician: Sharilyn Sites, MD [address] [place],  [place] [address]  HISTORICAL INFORMATION:   Selected notes from the MEDICAL RECORD NUMBER    Lab Results  Component Value Date   HGBA1C 7.0 (H) 08/24/2019     CURRENT MEDICATIONS: No current outpatient medications on file. (Ophthalmic Drugs)   No current facility-administered medications for this visit. (Ophthalmic Drugs)   Current Outpatient Medications (Other)  Medication Sig  . acetaminophen (TYLENOL) 325 MG tablet Take 650 mg by mouth every 6 (six) hours as needed for headache.  [name] amiodarone (PACERONE) 200 MG tablet Take 0.5 tablets (100 mg total) by mouth daily.  [name] apixaban (ELIQUIS) 5 MG TABS tablet Take 1 tablet (5 mg total) by mouth 2 (two) times daily.  [name] atorvastatin (LIPITOR) 80 MG tablet TAKE ONE TABLET BY MOUTH EVERY DAY AT 6:00PM  . carvedilol (COREG) 25 MG tablet Take 1 tablet (25 mg total) by mouth 2 (two) times daily.  . clopidogrel (PLAVIX) 75 MG tablet TAKE ONE TABLET (75MG  TOTAL) BY MOUTH DAILY  . cyanocobalamin (,VITAMIN B-12,) 1000 MCG/ML injection Inject 1,000 mcg into the muscle every 30 (thirty) days. Last injection was around 08/02/19  . diltiazem (CARDIZEM CD) 360 MG 24 hr capsule Take 1 capsule (360 mg total) by mouth daily.  [name] diltiazem (TIAZAC) 360 MG 24 hr  capsule Take 360 mg by mouth daily.  [name] FREESTYLE LITE test strip USE AS DIRECTED TWICEODAILY.  . furosemide (LASIX) 40 MG tablet TAKE ONE AND HALF TABLET (60MG  TOTAL) BY MOUTH IN THE MORNING AND ONE TABLET IN THE EVENING (40MG  TOTAL)  . insulin NPH-regular Human (NOVOLIN 70/30) (70-30) 100 UNIT/ML injection Inject 40 Units into the skin See admin instructions. Inject 25 units before breakfast and 25 units before supper  - only if pre-meal blood glucose is above 90 mg/dL. (Patient taking differently: Inject 40 Units into the skin See admin instructions. Inject 30 units before breakfast and 30 units before supper  - only if pre-meal blood glucose is above 90 mg/dL.)  . levothyroxine (SYNTHROID) 137 MCG tablet Take 137 mcg by mouth daily before breakfast.  . nitroGLYCERIN (NITROSTAT) 0.4 MG SL tablet Place 1 tablet (0.4 mg total) under the tongue every 5 (five) minutes as needed for chest pain.  [name] olmesartan (BENICAR) 40 MG tablet Take 40 mg by mouth daily.  . Potassium Chloride ER 20 MEQ TBCR Take 1 tablet by mouth at bedtime.  . potassium chloride SA (KLOR-CON) 20 MEQ tablet TAKE ONE (1) TABLET BY MOUTH EVERY DAY  . rOPINIRole (REQUIP) 0.5 MG tablet Take 0.5 mg by mouth at bedtime.   . Vitamin D, Ergocalciferol, (DRISDOL) 1.25 MG (50000 UNIT)  CAPS capsule Take 50,000 Units by mouth every 7 (seven) days. On tuesdays   No current facility-administered medications for this visit. (Other)      REVIEW OF SYSTEMS:    ALLERGIES Allergies  Allergen Reactions  . Penicillins Hives    Has patient had a PCN reaction causing immediate rash, facial/tongue/throat swelling, SOB or lightheadedness with hypotension: Yes Has patient had a PCN reaction causing severe rash involving mucus membranes or skin necrosis: No Has patient had a PCN reaction that required hospitalization No Has patient had a PCN reaction occurring within the last 10 years: No If all of the above answers are "NO", then may proceed with  Cephalosporin use.  [name] Percocet [Oxycodone-Acetaminophen] Nausea And Vomiting    PAST MEDICAL HISTORY Past Medical History:  Diagnosis Date  . Arthritis   . Atrial fibrillation and flutter ([place])    a. h/o PAF/flutter during admission in 2013 for PNA. b. PAF during adm for NSTEMI 07/2015, subsequent paroxysms since then.  . B12 deficiency anemia   . Cardiac tamponade 06/2016  . CHF (congestive heart failure) ([place])   . Coronary artery disease 11/30/2014   a. remote MI. b. h/o PTCA with scoring balloon to OM1 11/2014. c. NSTEMI 03/2015 s/p DES to prox-mid Cx. d. NSTEMI 07/2015 s/p scoring balloon/PTCA/DES to dRCA with PAF during that admission  . Cutaneous lupus erythematosus   . GERD (gastroesophageal reflux disease)   . Heart block   . History of blood transfusion 1980's   2nd surgical procedures  . HTN (hypertension)   . Hypercholesteremia   . Hypothyroidism   . Myocardial infarction ([place]) 02/2012  . Ovarian tumor   . PAD (peripheral artery disease) ([place])    a. s/p LE angio 2015; followed by Dr. [facility] Anon - managed medically.  . Pain with urination 05/08/2015  . Paroxysmal atrial flutter ([place])   . Pericardial effusion    a. 06/2016 after ppm - s/p pericardiocentesis.  . Superficial fungus infection of skin 06/29/2013  . Tachy-brady syndrome ([place])    a. s/p Medtronic PPM 06/2016, c/b lead perf/pericardial effusion.  [name] TIA (transient ischemic attack) 08/2001; ~ 2006  . Type II diabetes mellitus ([place])   . UTI (urinary tract infection) 05/08/2013   Past Surgical History:  Procedure Laterality Date  . ABDOMINAL AORTAGRAM N/A 01/03/2014   Procedure: ABDOMINAL [name];  Surgeon: Wellington Hampshire, MD;  Location: [facility] CATH LAB;  Service: Cardiovascular;  Laterality: N/A;  . ABDOMINAL HYSTERECTOMY  1972   "partial"  . APPENDECTOMY  1970's  . CARDIAC CATHETERIZATION  2008   Tiny OM-2 with 90% narrowing. Med tx.  [name] CARDIAC CATHETERIZATION N/A 11/30/2014   Procedure: Left Heart Cath and Coronary  Angiography;  Surgeon: Troy Sine, MD; LAD 20%, CFX 50%, OM1 95%, right PLB 30%, LV normal   . CARDIAC CATHETERIZATION N/A 11/30/2014   Procedure: Coronary Balloon Angioplasty;  Surgeon: Troy Sine, MD;  Angiosculpt scoring balloon and PTCA to the OM1 reducing stenosis from 95% to less than 10%  . CARDIAC CATHETERIZATION N/A 04/03/2015   Procedure: Left Heart Cath and Coronary Angiography;  Surgeon: Jolaine Artist, MD; dLAD 50%, CFX 90%, OM1 100%, PLA 15%, LVEDP 13    . CARDIAC CATHETERIZATION N/A 04/03/2015   Procedure: Coronary Stent Intervention;  Surgeon: Sherren Mocha, MD; 3.0x18 mm Xience DES to the CFX    . CARDIAC CATHETERIZATION N/A 08/02/2015   Procedure: Left Heart Cath and Coronary Angiography;  Surgeon: Troy Sine, MD;  Location: [facility]  INVASIVE CV LAB;  Service: Cardiovascular;  Laterality: N/A;  . CARDIAC CATHETERIZATION N/A 08/02/2015   Procedure: Coronary Stent Intervention;  Surgeon: Troy Sine, MD;  Location: [facility] CV LAB;  Service: Cardiovascular;  Laterality: N/A;  . CARDIAC CATHETERIZATION N/A 06/25/2016   Procedure: Pericardiocentesis;  Surgeon: Will Meredith Leeds, MD;  Location: [facility] CV LAB;  Service: Cardiovascular;  Laterality: N/A;  . cardiac stents    . CARDIOVERSION N/A 12/15/2017   Procedure: CARDIOVERSION;  Surgeon: Acie Fredrickson Wonda Cheng, MD;  Location: [facility] ENDOSCOPY;  Service: Cardiovascular;  Laterality: N/A;  . CHOLECYSTECTOMY OPEN  1990's  . COLONOSCOPY  2005   Dr. Laural Golden: pancolonic divericula, polyp, path unknown currently  . COLONOSCOPY  2012   Dr. Oneida Alar: Normal TI, scattered diverticula in entire colon, small internal hemorrhoids, normal colon biopsies. Colonoscopy in 5-10 years.   . COLOSTOMY  05/1979  . COLOSTOMY REVERSAL  11/1979  . EP IMPLANTABLE DEVICE N/A 06/25/2016   Procedure: Lead Revision/Repair;  Surgeon: Will Meredith Leeds, MD;  Location: [facility] CV LAB;  Service: Cardiovascular;  Laterality: N/A;  . EP IMPLANTABLE  DEVICE N/A 06/25/2016   Procedure: Pacemaker Implant;  Surgeon: Will Meredith Leeds, MD;  Location: [facility];  Service: Cardiovascular;  Laterality: N/A;  . EXCISIONAL HEMORRHOIDECTOMY  1970's  . EYE SURGERY Left 2000   "branch vein occlusion"  . EYE SURGERY Left ~ 2001   "smoothed out wrinkle"  . LEFT OOPHORECTOMY  05/1979   nicked bowel, peritonitis, colostomy; colostomy reversed 1981   . LOWER EXTREMITY ANGIOGRAM N/A 01/03/2014   Procedure: LOWER EXTREMITY ANGIOGRAM;  Surgeon: Wellington Hampshire, MD;  Location: [facility] CATH LAB;  Service: Cardiovascular;  Laterality: N/A;  . Nuclear med stress test  10/2011   Small area of mild ischemia inferoapically.  [name] PARTIAL HYSTERECTOMY  1970's   left ovaries, then ovaries removed later due tumors   . RIGHT OOPHORECTOMY  1970's    FAMILY HISTORY Family History  Problem Relation Age of Onset  . Heart disease Mother        deceased  . Heart disease Father        deceased, heart disease  . Diabetes Brother   . Heart disease Brother   . Thyroid disease Brother   . Heart disease Sister   . Heart disease Brother   . Thyroid disease Brother   . Lupus Daughter   . Colon cancer Neg Hx     SOCIAL HISTORY Social History   Tobacco Use  . Smoking status: Never Smoker  . Smokeless tobacco: Never Used  . Tobacco comment: Never smoked  Substance Use Topics  . Alcohol use: No    Alcohol/week: 0.0 standard drinks  . Drug use: No         OPHTHALMIC EXAM:  Base Eye Exam    Visual Acuity (Snellen - Linear)      Right Left   Dist cc 20/40 CF @ 1'   Dist ph cc NI NI   Correction: Glasses       Tonometry (Tonopen, 11:02 AM)      Right Left   Pressure 8 11       Pupils      Pupils Dark Light Shape React APD   Right PERRL 6 4 Round Brisk None   Left PERRL 6 6 Round Sluggish +2       Visual Fields (Counting fingers)      Left Right     Full  Restrictions Total inferior temporal, inferior nasal deficiencies         Extraocular Movement      Right Left    Full Full       Neuro/Psych    Oriented x3: Yes   Mood/Affect: Normal       Dilation    Right eye: 1.0% Mydriacyl, 2.5% Phenylephrine @ 11:02 AM        Slit Lamp and Fundus Exam    External Exam      Right Left   External Normal Normal       Slit Lamp Exam      Right Left   Lids/Lashes Normal Normal   Conjunctiva/Sclera White and quiet White and quiet   Cornea Clear Clear   Anterior Chamber Deep and quiet Deep and quiet   Iris Round and reactive Round and reactive   Lens Posterior chamber intraocular lens Posterior chamber intraocular lens   Anterior Vitreous Normal Normal       Fundus Exam      Right Left   Posterior Vitreous Posterior vitreous detachment    Disc Normal    C/D Ratio 0.3    Macula Microaneurysms, Early age related macular degeneration, Atrophy, Scattered macular atrophy    Vessels Macular branch retinal vein occlusion severe nonproliferative diabetic retinopathy    Periphery Normal           IMAGING AND PROCEDURES  Imaging and Procedures for @TODAY @  OCT, Retina - OU - Both Eyes       Right Eye Quality was good. Scan locations included subfoveal. Progression has improved. Findings include abnormal foveal contour, outer retinal atrophy, inner retinal atrophy, cystoid macular edema.   Left Eye Quality was good. Scan locations included subfoveal. Central Foveal Thickness: 197. Progression has been stable.   Notes Macular branch retinal vein occlusion OD, improved overall on shorter interval examinations at 5 weeks.  Will repeat Avastin today OD and follow-up exam in 5 weeks       Intravitreal Injection, Pharmacologic Agent - OD - Right Eye       Time Out 10/11/2019. 11:42 AM. Confirmed correct patient, procedure, site, and patient consented.   Anesthesia Topical anesthesia was used. Anesthetic medications included Akten 3.5%.   Procedure Preparation included 10% betadine to eyelids,  Tobramycin 0.3%. A 30 gauge needle was used.   Injection:  1.25 mg Bevacizumab (AVASTIN) SOLN   NDC: [address]-4   Route: Intravitreal, Site: Right Eye, Waste: 0 mg  Post-op Post injection exam found visual acuity of at least counting fingers. The patient tolerated the procedure well. There were no complications. The patient received written and verbal post procedure care education. Post injection medications were not given.                 ASSESSMENT/PLAN:  Branch retinal vein occlusion with macular edema of right eye The nature of branch retinal vein occlusion with macular edema was discussed.  The patient was given access to printed information.  The treatment options including continued observation looking for spontaneous resolution versus grid laser versus intravitreal Kenalog injection were discussed.  PRIMARY THERAPY CONSISTS of Anti-VEGF Therapies, AVASTIN, LUCENTIS AND EYLEA.  Their usage was discussed to assist in halting the progression of Macular Edema, in order to preserve, protect or improve acuity.  Additionally, at times, limited focal laser therapy is used in the management.  The risks and benefits of all these options were discussed with the patient.  The patient's questions were answered.  OD with history of multiple recurrences.  Responsive and treatable with Avastin.  Currently on 5-week exam interval OD.  Severe nonproliferative diabetic retinopathy of right eye, with macular edema, associated with type 2 diabetes mellitus ([place]) The nature of severe nonproliferative diabetic retinopathy discussed with the patient as well as the need for more frequent follow up and likely progression to proliferative disease in the near future. The options of continued observation versus panretinal photocoagulation at this time were reviewed as well as the risks, benefits, and alternatives. More recent option includes the use of ocular injectable medications to slow progression of  retinal disease. Tight control of glucose, blood pressure, and serum lipid levels were recommended under the direction of general physician or endocrinologist, as well as avoidance of smoking and maintenance of normal body weight. The 2-year risk of progression to proliferative diabetic retinopathy is 60%.  OD stable.  Posterior vitreous detachment of right eye   The nature of posterior vitreous detachment was discussed with the patient as well as its physiology, its age prevalence, and its possible implication regarding retinal breaks and detachment.  An informational brochure was given to the patient.  All the patient's questions were answered.  The patient was asked to return if new or different flashes or floaters develops.   Patient was instructed to contact office immediately if any changes were noticed. I explained to the patient that vitreous inside the eye is similar to jello inside a bowl. As the jello melts it can start to pull away from the bowl, similarly the vitreous throughout our lives can begin to pull away from the retina. That process is called a posterior vitreous detachment. In some cases, the vitreous can tug hard enough on the retina to form a retinal tear. I discussed with the patient the signs and symptoms of a retinal detachment.  Do not rub the eye.      ICD-10-CM   1. Branch retinal vein occlusion with macular edema of right eye  H34.8310 OCT, Retina - OU - Both Eyes    Intravitreal Injection, Pharmacologic Agent - OD - Right Eye    Bevacizumab (AVASTIN) SOLN 1.25 mg  2. Severe nonproliferative diabetic retinopathy of right eye, with macular edema, associated with type 2 diabetes mellitus ([place])  E11.3411   3. Right retinal defect  H33.301   4. Posterior vitreous detachment of right eye  H43.811   5. Retinal microaneurysm of right eye  H35.041     1.  Intravitreal Avastin delivered today OD  2.  You will dilated next also to monitor diabetic retinopathy  3.   Ophthalmic Meds Ordered this visit:  Meds ordered this encounter  Medications  . Bevacizumab (AVASTIN) SOLN 1.25 mg       Return in about 5 weeks (around 11/15/2019) for DILATE OU, AVASTIN OCT, OD.  There are no Patient Instructions on file for this visit.   Explained the diagnoses, plan, and follow up with the patient and they expressed understanding.  Patient expressed understanding of the importance of proper follow up care.   Clent Demark Mackynzie Woolford M.D. Diseases & Surgery of the Retina and Vitreous Retina & Diabetic [place] @TODAY @     Abbreviations: M myopia (nearsighted); A astigmatism; H hyperopia (farsighted); P presbyopia; Mrx spectacle prescription;  CTL contact lenses; OD right eye; OS left eye; OU both eyes  XT exotropia; ET esotropia; PEK punctate epithelial keratitis; PEE punctate epithelial erosions; DES dry eye syndrome; MGD meibomian gland dysfunction; ATs artificial tears; PFAT's  preservative free artificial tears; [place] nuclear sclerotic cataract; PSC posterior subcapsular cataract; ERM epi-retinal membrane; PVD posterior vitreous detachment; RD retinal detachment; DM diabetes mellitus; DR diabetic retinopathy; NPDR non-proliferative diabetic retinopathy; PDR proliferative diabetic retinopathy; CSME clinically significant macular edema; DME diabetic macular edema; dbh dot blot hemorrhages; CWS cotton wool spot; POAG primary open angle glaucoma; C/D cup-to-disc ratio; HVF humphrey visual field; GVF goldmann visual field; OCT optical coherence tomography; IOP intraocular pressure; BRVO Branch retinal vein occlusion; CRVO central retinal vein occlusion; CRAO central retinal artery occlusion; BRAO branch retinal artery occlusion; RT retinal tear; SB scleral buckle; PPV pars plana vitrectomy; VH Vitreous hemorrhage; PRP panretinal laser photocoagulation; IVK intravitreal kenalog; VMT vitreomacular traction; MH Macular hole;  NVD neovascularization of the disc; NVE neovascularization  elsewhere; AREDS age related eye disease study; ARMD age related macular degeneration; POAG primary open angle glaucoma; EBMD epithelial/anterior basement membrane dystrophy; ACIOL anterior chamber intraocular lens; IOL intraocular lens; PCIOL posterior chamber intraocular lens; Phaco/IOL phacoemulsification with intraocular lens placement; [place] photorefractive keratectomy; LASIK laser assisted in situ keratomileusis; HTN hypertension; DM diabetes mellitus; COPD chronic obstructive pulmonary disease

## 2019-10-11 NOTE — Assessment & Plan Note (Signed)
The nature of branch retinal vein occlusion with macular edema was discussed.  The patient was given access to printed information.  The treatment options including continued observation looking for spontaneous resolution versus grid laser versus intravitreal Kenalog injection were discussed.  PRIMARY THERAPY CONSISTS of Anti-VEGF Therapies, AVASTIN, LUCENTIS AND EYLEA.  Their usage was discussed to assist in halting the progression of Macular Edema, in order to preserve, protect or improve acuity.  Additionally, at times, limited focal laser therapy is used in the management.  The risks and benefits of all these options were discussed with the patient.  The patient's questions were answered.  OD with history of multiple recurrences.  Responsive and treatable with Avastin.  Currently on 5-week exam interval OD.

## 2019-10-11 NOTE — Progress Notes (Signed)
PPM Remote  

## 2019-10-11 NOTE — Assessment & Plan Note (Signed)

## 2019-10-16 DIAGNOSIS — E663 Overweight: Secondary | ICD-10-CM | POA: Diagnosis not present

## 2019-10-16 DIAGNOSIS — I480 Paroxysmal atrial fibrillation: Secondary | ICD-10-CM | POA: Diagnosis not present

## 2019-10-16 DIAGNOSIS — E119 Type 2 diabetes mellitus without complications: Secondary | ICD-10-CM | POA: Diagnosis not present

## 2019-10-16 DIAGNOSIS — Z6828 Body mass index (BMI) 28.0-28.9, adult: Secondary | ICD-10-CM | POA: Diagnosis not present

## 2019-10-25 DIAGNOSIS — N189 Chronic kidney disease, unspecified: Secondary | ICD-10-CM | POA: Diagnosis not present

## 2019-10-25 DIAGNOSIS — E211 Secondary hyperparathyroidism, not elsewhere classified: Secondary | ICD-10-CM | POA: Diagnosis not present

## 2019-10-25 DIAGNOSIS — E1122 Type 2 diabetes mellitus with diabetic chronic kidney disease: Secondary | ICD-10-CM | POA: Diagnosis not present

## 2019-10-25 DIAGNOSIS — E559 Vitamin D deficiency, unspecified: Secondary | ICD-10-CM | POA: Diagnosis not present

## 2019-10-25 DIAGNOSIS — N170 Acute kidney failure with tubular necrosis: Secondary | ICD-10-CM | POA: Diagnosis not present

## 2019-10-25 DIAGNOSIS — R809 Proteinuria, unspecified: Secondary | ICD-10-CM | POA: Diagnosis not present

## 2019-10-25 DIAGNOSIS — I129 Hypertensive chronic kidney disease with stage 1 through stage 4 chronic kidney disease, or unspecified chronic kidney disease: Secondary | ICD-10-CM | POA: Diagnosis not present

## 2019-10-25 DIAGNOSIS — Z79899 Other long term (current) drug therapy: Secondary | ICD-10-CM | POA: Diagnosis not present

## 2019-10-25 DIAGNOSIS — I5032 Chronic diastolic (congestive) heart failure: Secondary | ICD-10-CM | POA: Diagnosis not present

## 2019-10-27 DIAGNOSIS — I129 Hypertensive chronic kidney disease with stage 1 through stage 4 chronic kidney disease, or unspecified chronic kidney disease: Secondary | ICD-10-CM | POA: Diagnosis not present

## 2019-10-27 DIAGNOSIS — R809 Proteinuria, unspecified: Secondary | ICD-10-CM | POA: Diagnosis not present

## 2019-10-27 DIAGNOSIS — I5032 Chronic diastolic (congestive) heart failure: Secondary | ICD-10-CM | POA: Diagnosis not present

## 2019-10-27 DIAGNOSIS — N200 Calculus of kidney: Secondary | ICD-10-CM | POA: Diagnosis not present

## 2019-10-27 DIAGNOSIS — E559 Vitamin D deficiency, unspecified: Secondary | ICD-10-CM | POA: Diagnosis not present

## 2019-10-27 DIAGNOSIS — E1122 Type 2 diabetes mellitus with diabetic chronic kidney disease: Secondary | ICD-10-CM | POA: Diagnosis not present

## 2019-10-27 DIAGNOSIS — E211 Secondary hyperparathyroidism, not elsewhere classified: Secondary | ICD-10-CM | POA: Diagnosis not present

## 2019-10-27 DIAGNOSIS — E1129 Type 2 diabetes mellitus with other diabetic kidney complication: Secondary | ICD-10-CM | POA: Diagnosis not present

## 2019-10-27 DIAGNOSIS — N189 Chronic kidney disease, unspecified: Secondary | ICD-10-CM | POA: Diagnosis not present

## 2019-11-13 ENCOUNTER — Other Ambulatory Visit: Payer: Self-pay

## 2019-11-13 ENCOUNTER — Ambulatory Visit (INDEPENDENT_AMBULATORY_CARE_PROVIDER_SITE_OTHER): Payer: PPO | Admitting: Ophthalmology

## 2019-11-13 ENCOUNTER — Encounter (INDEPENDENT_AMBULATORY_CARE_PROVIDER_SITE_OTHER): Payer: Self-pay | Admitting: Ophthalmology

## 2019-11-13 DIAGNOSIS — H33301 Unspecified retinal break, right eye: Secondary | ICD-10-CM | POA: Diagnosis not present

## 2019-11-13 DIAGNOSIS — H43811 Vitreous degeneration, right eye: Secondary | ICD-10-CM

## 2019-11-13 DIAGNOSIS — E113411 Type 2 diabetes mellitus with severe nonproliferative diabetic retinopathy with macular edema, right eye: Secondary | ICD-10-CM | POA: Diagnosis not present

## 2019-11-13 DIAGNOSIS — H34831 Tributary (branch) retinal vein occlusion, right eye, with macular edema: Secondary | ICD-10-CM

## 2019-11-13 DIAGNOSIS — H35041 Retinal micro-aneurysms, unspecified, right eye: Secondary | ICD-10-CM | POA: Diagnosis not present

## 2019-11-13 MED ORDER — BEVACIZUMAB CHEMO INJECTION 1.25MG/0.05ML SYRINGE FOR KALEIDOSCOPE
1.2500 mg | INTRAVITREAL | Status: AC | PRN
  Administered 2019-11-13: 1.25 mg via INTRAVITREAL

## 2019-11-13 NOTE — Progress Notes (Signed)
11/13/2019     CHIEF COMPLAINT Patient presents for Retina Follow Up   HISTORY OF PRESENT ILLNESS: Virginia Huber is a 74 y.o. female who presents to the clinic today for:   HPI    Retina Follow Up    Patient presents with  CRVO/BRVO.  In right eye.  Duration of 5 weeks.  Since onset it is stable.          Comments    5 week follow up - OCT OU, Possible Avastin OD Patient denies change in vision and overall has no complaints. LBS 159 this AM A1C 7.0 March 2021        Last edited by Gerda Diss on 11/13/2019 10:45 AM. (History)      Referring physician: Sharilyn Sites, MD [address] [place],  [place] [address]  HISTORICAL INFORMATION:   Selected notes from the MEDICAL RECORD NUMBER    Lab Results  Component Value Date   HGBA1C 7.0 (H) 08/24/2019     CURRENT MEDICATIONS: No current outpatient medications on file. (Ophthalmic Drugs)   No current facility-administered medications for this visit. (Ophthalmic Drugs)   Current Outpatient Medications (Other)  Medication Sig  . acetaminophen (TYLENOL) 325 MG tablet Take 650 mg by mouth every 6 (six) hours as needed for headache.  [name] amiodarone (PACERONE) 200 MG tablet Take 0.5 tablets (100 mg total) by mouth daily.  [name] apixaban (ELIQUIS) 5 MG TABS tablet Take 1 tablet (5 mg total) by mouth 2 (two) times daily.  [name] atorvastatin (LIPITOR) 80 MG tablet TAKE ONE TABLET BY MOUTH EVERY DAY AT 6:00PM  . carvedilol (COREG) 25 MG tablet Take 1 tablet (25 mg total) by mouth 2 (two) times daily.  . clopidogrel (PLAVIX) 75 MG tablet TAKE ONE TABLET (75MG  TOTAL) BY MOUTH DAILY  . cyanocobalamin (,VITAMIN B-12,) 1000 MCG/ML injection Inject 1,000 mcg into the muscle every 30 (thirty) days. Last injection was around 08/02/19  . diltiazem (CARDIZEM CD) 360 MG 24 hr capsule Take 1 capsule (360 mg total) by mouth daily.  [name] diltiazem (TIAZAC) 360 MG 24 hr capsule Take 360 mg by mouth daily.  [name] FREESTYLE LITE test strip USE AS  DIRECTED TWICEODAILY.  . furosemide (LASIX) 40 MG tablet TAKE ONE AND HALF TABLET (60MG  TOTAL) BY MOUTH IN THE MORNING AND ONE TABLET IN THE EVENING (40MG  TOTAL)  . insulin NPH-regular Human (NOVOLIN 70/30) (70-30) 100 UNIT/ML injection Inject 40 Units into the skin See admin instructions. Inject 25 units before breakfast and 25 units before supper  - only if pre-meal blood glucose is above 90 mg/dL. (Patient taking differently: Inject 40 Units into the skin See admin instructions. Inject 30 units before breakfast and 30 units before supper  - only if pre-meal blood glucose is above 90 mg/dL.)  . levothyroxine (SYNTHROID) 137 MCG tablet Take 137 mcg by mouth daily before breakfast.  . nitroGLYCERIN (NITROSTAT) 0.4 MG SL tablet Place 1 tablet (0.4 mg total) under the tongue every 5 (five) minutes as needed for chest pain.  [name] olmesartan (BENICAR) 40 MG tablet Take 40 mg by mouth daily.  . Potassium Chloride ER 20 MEQ TBCR Take 1 tablet by mouth at bedtime.  . potassium chloride SA (KLOR-CON) 20 MEQ tablet TAKE ONE (1) TABLET BY MOUTH EVERY DAY  . rOPINIRole (REQUIP) 0.5 MG tablet Take 0.5 mg by mouth at bedtime.   . Vitamin D, Ergocalciferol, (DRISDOL) 1.25 MG (50000 UNIT) CAPS capsule Take 50,000 Units by mouth every 7 (seven)  days. On tuesdays   No current facility-administered medications for this visit. (Other)      REVIEW OF SYSTEMS:    ALLERGIES Allergies  Allergen Reactions  . Penicillins Hives    Has patient had a PCN reaction causing immediate rash, facial/tongue/throat swelling, SOB or lightheadedness with hypotension: Yes Has patient had a PCN reaction causing severe rash involving mucus membranes or skin necrosis: No Has patient had a PCN reaction that required hospitalization No Has patient had a PCN reaction occurring within the last 10 years: No If all of the above answers are "NO", then may proceed with Cephalosporin use.  [name] Percocet [Oxycodone-Acetaminophen] Nausea And  Vomiting    PAST MEDICAL HISTORY Past Medical History:  Diagnosis Date  . Arthritis   . Atrial fibrillation and flutter ([place])    a. h/o PAF/flutter during admission in 2013 for PNA. b. PAF during adm for NSTEMI 07/2015, subsequent paroxysms since then.  . B12 deficiency anemia   . Cardiac tamponade 06/2016  . CHF (congestive heart failure) ([place])   . Coronary artery disease 11/30/2014   a. remote MI. b. h/o PTCA with scoring balloon to OM1 11/2014. c. NSTEMI 03/2015 s/p DES to prox-mid Cx. d. NSTEMI 07/2015 s/p scoring balloon/PTCA/DES to dRCA with PAF during that admission  . Cutaneous lupus erythematosus   . GERD (gastroesophageal reflux disease)   . Heart block   . History of blood transfusion 1980's   2nd surgical procedures  . HTN (hypertension)   . Hypercholesteremia   . Hypothyroidism   . Myocardial infarction ([place]) 02/2012  . Ovarian tumor   . PAD (peripheral artery disease) ([place])    a. s/p LE angio 2015; followed by Dr. Fletcher Anon - managed medically.  . Pain with urination 05/08/2015  . Paroxysmal atrial flutter ([place])   . Pericardial effusion    a. 06/2016 after ppm - s/p pericardiocentesis.  . Superficial fungus infection of skin 06/29/2013  . Tachy-brady syndrome ([place])    a. s/p Medtronic PPM 06/2016, c/b lead perf/pericardial effusion.  [name] TIA (transient ischemic attack) 08/2001; ~ 2006  . Type II diabetes mellitus ([place])   . UTI (urinary tract infection) 05/08/2013   Past Surgical History:  Procedure Laterality Date  . ABDOMINAL AORTAGRAM N/A 01/03/2014   Procedure: ABDOMINAL [name];  Surgeon: Wellington Hampshire, MD;  Location: [facility] CATH LAB;  Service: Cardiovascular;  Laterality: N/A;  . ABDOMINAL HYSTERECTOMY  1972   "partial"  . APPENDECTOMY  1970's  . CARDIAC CATHETERIZATION  2008   Tiny OM-2 with 90% narrowing. Med tx.  [name] CARDIAC CATHETERIZATION N/A 11/30/2014   Procedure: Left Heart Cath and Coronary Angiography;  Surgeon: Troy Sine, MD; LAD 20%, CFX 50%, OM1 95%,  right PLB 30%, LV normal   . CARDIAC CATHETERIZATION N/A 11/30/2014   Procedure: Coronary Balloon Angioplasty;  Surgeon: Troy Sine, MD;  Angiosculpt scoring balloon and PTCA to the OM1 reducing stenosis from 95% to less than 10%  . CARDIAC CATHETERIZATION N/A 04/03/2015   Procedure: Left Heart Cath and Coronary Angiography;  Surgeon: Jolaine Artist, MD; dLAD 50%, CFX 90%, OM1 100%, PLA 15%, LVEDP 13    . CARDIAC CATHETERIZATION N/A 04/03/2015   Procedure: Coronary Stent Intervention;  Surgeon: Sherren Mocha, MD; 3.0x18 mm Xience DES to the CFX    . CARDIAC CATHETERIZATION N/A 08/02/2015   Procedure: Left Heart Cath and Coronary Angiography;  Surgeon: Troy Sine, MD;  Location: [facility] CV LAB;  Service: Cardiovascular;  Laterality: N/A;  .  CARDIAC CATHETERIZATION N/A 08/02/2015   Procedure: Coronary Stent Intervention;  Surgeon: Troy Sine, MD;  Location: [facility] CV LAB;  Service: Cardiovascular;  Laterality: N/A;  . CARDIAC CATHETERIZATION N/A 06/25/2016   Procedure: Pericardiocentesis;  Surgeon: Will Meredith Leeds, MD;  Location: [facility] CV LAB;  Service: Cardiovascular;  Laterality: N/A;  . cardiac stents    . CARDIOVERSION N/A 12/15/2017   Procedure: CARDIOVERSION;  Surgeon: Acie Fredrickson Wonda Cheng, MD;  Location: [facility] ENDOSCOPY;  Service: Cardiovascular;  Laterality: N/A;  . CHOLECYSTECTOMY OPEN  1990's  . COLONOSCOPY  2005   Dr. Laural Golden: pancolonic divericula, polyp, path unknown currently  . COLONOSCOPY  2012   Dr. Oneida Alar: Normal TI, scattered diverticula in entire colon, small internal hemorrhoids, normal colon biopsies. Colonoscopy in 5-10 years.   . COLOSTOMY  05/1979  . COLOSTOMY REVERSAL  11/1979  . EP IMPLANTABLE DEVICE N/A 06/25/2016   Procedure: Lead Revision/Repair;  Surgeon: Will Meredith Leeds, MD;  Location: [facility] CV LAB;  Service: Cardiovascular;  Laterality: N/A;  . EP IMPLANTABLE DEVICE N/A 06/25/2016   Procedure: Pacemaker Implant;  Surgeon: Will Meredith Leeds, MD;  Location: [facility] CV LAB;  Service: Cardiovascular;  Laterality: N/A;  . EXCISIONAL HEMORRHOIDECTOMY  1970's  . EYE SURGERY Left 2000   "branch vein occlusion"  . EYE SURGERY Left ~ 2001   "smoothed out wrinkle"  . LEFT OOPHORECTOMY  05/1979   nicked bowel, peritonitis, colostomy; colostomy reversed 1981   . LOWER EXTREMITY ANGIOGRAM N/A 01/03/2014   Procedure: LOWER EXTREMITY ANGIOGRAM;  Surgeon: Wellington Hampshire, MD;  Location: [facility] CATH LAB;  Service: Cardiovascular;  Laterality: N/A;  . Nuclear med stress test  10/2011   Small area of mild ischemia inferoapically.  [name] PARTIAL HYSTERECTOMY  1970's   left ovaries, then ovaries removed later due tumors   . RIGHT OOPHORECTOMY  1970's    FAMILY HISTORY Family History  Problem Relation Age of Onset  . Heart disease Mother        deceased  . Heart disease Father        deceased, heart disease  . Diabetes Brother   . Heart disease Brother   . Thyroid disease Brother   . Heart disease Sister   . Heart disease Brother   . Thyroid disease Brother   . Lupus Daughter   . Colon cancer Neg Hx     SOCIAL HISTORY Social History   Tobacco Use  . Smoking status: Never Smoker  . Smokeless tobacco: Never Used  . Tobacco comment: Never smoked  Substance Use Topics  . Alcohol use: No    Alcohol/week: 0.0 standard drinks  . Drug use: No         OPHTHALMIC EXAM:  Base Eye Exam    Visual Acuity (Snellen - Linear)      Right Left   Dist cc 20/40-1 CF @ 1'   Dist ph cc NI NI       Tonometry (Tonopen, 10:49 AM)      Right Left   Pressure 11 13       Pupils      Pupils Dark Light Shape React APD   Right PERRL 6 4 Round Brisk None   Left PERRL 6 6 Round Sluggish +2       Visual Fields (Counting fingers)      Left Right     Full   Restrictions Total inferior temporal, inferior nasal deficiencies        Extraocular  Movement      Right Left    Full Full       Neuro/Psych    Oriented x3: Yes    Mood/Affect: Normal       Dilation    Both eyes: 1.0% Mydriacyl, 2.5% Phenylephrine @ 10:49 AM        Slit Lamp and Fundus Exam    External Exam      Right Left   External Normal Normal       Slit Lamp Exam      Right Left   Lids/Lashes Normal Normal   Conjunctiva/Sclera White and quiet White and quiet   Cornea Clear Clear   Anterior Chamber Deep and quiet Deep and quiet   Iris Round and reactive Round and reactive   Lens Posterior chamber intraocular lens Posterior chamber intraocular lens   Anterior Vitreous Normal Normal       Fundus Exam      Right Left   Posterior Vitreous Posterior vitreous detachment Posterior vitreous detachment   Disc Normal Normal   C/D Ratio 0.45 0.45   Macula Hard drusen, Cystoid macular edema, Microaneurysms Hard drusen, , Pigmented atrophy   Vessels Old branch retinal vein occlusion Old branch retinal vein occlusion   Periphery Normal Letter therapy          IMAGING AND PROCEDURES  Imaging and Procedures for 11/13/19  OCT, Retina - OU - Both Eyes       Right Eye Quality was good. Scan locations included subfoveal. Central Foveal Thickness: 229. Findings include abnormal foveal contour, cystoid macular edema.   Left Eye Quality was good. Scan locations included subfoveal. Central Foveal Thickness: 196. Findings include abnormal foveal contour, central retinal atrophy, outer retinal atrophy, preretinal fibrosis.   Notes OD, CME still improving.       Intravitreal Injection, Pharmacologic Agent - OD - Right Eye       Time Out 11/13/2019. 12:14 PM. Confirmed correct patient, procedure, site, and patient consented.   Anesthesia Topical anesthesia was used. Anesthetic medications included Akten 3.5%.   Procedure Preparation included Ofloxacin .   Injection:  1.25 mg Bevacizumab (AVASTIN) SOLN   NDC: [address]-5, Lot: [id number]   Route: Intravitreal, Site: Right Eye, Waste: 0 mg  Post-op Post injection exam found visual  acuity of at least counting fingers. The patient tolerated the procedure well. There were no complications. The patient received written and verbal post procedure care education. Post injection medications were not given.                 ASSESSMENT/PLAN:  Branch retinal vein occlusion with macular edema of right eye Macular edema stable on intravitreal Avastin every 4 to 5 weeks      ICD-10-CM   1. Branch retinal vein occlusion with macular edema of right eye  H34.8310 OCT, Retina - OU - Both Eyes    Intravitreal Injection, Pharmacologic Agent - OD - Right Eye    Bevacizumab (AVASTIN) SOLN 1.25 mg  2. Posterior vitreous detachment of right eye  H43.811   3. Severe nonproliferative diabetic retinopathy of right eye, with macular edema, associated with type 2 diabetes mellitus ([place])  E11.3411   4. Right retinal defect  H33.301   5. Retinal microaneurysm of right eye  H35.041     1.  2.  3.  Ophthalmic Meds Ordered this visit:  Meds ordered this encounter  Medications  . Bevacizumab (AVASTIN) SOLN 1.25 mg       Return in about  5 weeks (around 12/18/2019) for AVASTIN OCT, OD.  There are no Patient Instructions on file for this visit.   Explained the diagnoses, plan, and follow up with the patient and they expressed understanding.  Patient expressed understanding of the importance of proper follow up care.   Clent Demark Rankin M.D. Diseases & Surgery of the Retina and Vitreous [facility] 11/13/19     Abbreviations: M myopia (nearsighted); A astigmatism; H hyperopia (farsighted); P presbyopia; Mrx spectacle prescription;  CTL contact lenses; OD right eye; OS left eye; OU both eyes  XT exotropia; ET esotropia; PEK punctate epithelial keratitis; PEE punctate epithelial erosions; DES dry eye syndrome; MGD meibomian gland dysfunction; ATs artificial tears; PFAT's preservative free artificial tears; [place] nuclear sclerotic cataract; PSC posterior subcapsular  cataract; ERM epi-retinal membrane; PVD posterior vitreous detachment; RD retinal detachment; DM diabetes mellitus; DR diabetic retinopathy; NPDR non-proliferative diabetic retinopathy; PDR proliferative diabetic retinopathy; CSME clinically significant macular edema; DME diabetic macular edema; dbh dot blot hemorrhages; CWS cotton wool spot; POAG primary open angle glaucoma; C/D cup-to-disc ratio; HVF humphrey visual field; GVF goldmann visual field; OCT optical coherence tomography; IOP intraocular pressure; BRVO Branch retinal vein occlusion; CRVO central retinal vein occlusion; CRAO central retinal artery occlusion; BRAO branch retinal artery occlusion; RT retinal tear; SB scleral buckle; PPV pars plana vitrectomy; VH Vitreous hemorrhage; PRP panretinal laser photocoagulation; IVK intravitreal kenalog; VMT vitreomacular traction; MH Macular hole;  NVD neovascularization of the disc; NVE neovascularization elsewhere; AREDS age related eye disease study; ARMD age related macular degeneration; POAG primary open angle glaucoma; EBMD epithelial/anterior basement membrane dystrophy; ACIOL anterior chamber intraocular lens; IOL intraocular lens; PCIOL posterior chamber intraocular lens; Phaco/IOL phacoemulsification with intraocular lens placement; [place] photorefractive keratectomy; LASIK laser assisted in situ keratomileusis; HTN hypertension; DM diabetes mellitus; COPD chronic obstructive pulmonary disease

## 2019-11-13 NOTE — Assessment & Plan Note (Signed)
Macular edema stable on intravitreal Avastin every 4 to 5 weeks

## 2019-11-15 ENCOUNTER — Encounter (INDEPENDENT_AMBULATORY_CARE_PROVIDER_SITE_OTHER): Payer: PPO | Admitting: Ophthalmology

## 2019-11-20 DIAGNOSIS — I5032 Chronic diastolic (congestive) heart failure: Secondary | ICD-10-CM | POA: Diagnosis not present

## 2019-11-20 DIAGNOSIS — E1122 Type 2 diabetes mellitus with diabetic chronic kidney disease: Secondary | ICD-10-CM | POA: Diagnosis not present

## 2019-11-20 DIAGNOSIS — I130 Hypertensive heart and chronic kidney disease with heart failure and stage 1 through stage 4 chronic kidney disease, or unspecified chronic kidney disease: Secondary | ICD-10-CM | POA: Diagnosis not present

## 2019-11-20 DIAGNOSIS — N184 Chronic kidney disease, stage 4 (severe): Secondary | ICD-10-CM | POA: Diagnosis not present

## 2019-12-04 DIAGNOSIS — N184 Chronic kidney disease, stage 4 (severe): Secondary | ICD-10-CM | POA: Diagnosis not present

## 2019-12-04 DIAGNOSIS — E539 Vitamin B deficiency, unspecified: Secondary | ICD-10-CM | POA: Diagnosis not present

## 2019-12-04 DIAGNOSIS — D649 Anemia, unspecified: Secondary | ICD-10-CM | POA: Diagnosis not present

## 2019-12-04 DIAGNOSIS — E663 Overweight: Secondary | ICD-10-CM | POA: Diagnosis not present

## 2019-12-04 DIAGNOSIS — E119 Type 2 diabetes mellitus without complications: Secondary | ICD-10-CM | POA: Diagnosis not present

## 2019-12-04 DIAGNOSIS — Z6828 Body mass index (BMI) 28.0-28.9, adult: Secondary | ICD-10-CM | POA: Diagnosis not present

## 2019-12-04 DIAGNOSIS — E7849 Other hyperlipidemia: Secondary | ICD-10-CM | POA: Diagnosis not present

## 2019-12-04 LAB — BASIC METABOLIC PANEL
BUN: 24 — AB (ref 4–21)
Creatinine: 1.5 — AB (ref 0.5–1.1)
Glucose: 234

## 2019-12-04 LAB — HEMOGLOBIN A1C: Hemoglobin A1C: 7.7

## 2019-12-13 DIAGNOSIS — N200 Calculus of kidney: Secondary | ICD-10-CM | POA: Diagnosis not present

## 2019-12-13 DIAGNOSIS — E1122 Type 2 diabetes mellitus with diabetic chronic kidney disease: Secondary | ICD-10-CM | POA: Diagnosis not present

## 2019-12-13 DIAGNOSIS — I129 Hypertensive chronic kidney disease with stage 1 through stage 4 chronic kidney disease, or unspecified chronic kidney disease: Secondary | ICD-10-CM | POA: Diagnosis not present

## 2019-12-13 DIAGNOSIS — E211 Secondary hyperparathyroidism, not elsewhere classified: Secondary | ICD-10-CM | POA: Diagnosis not present

## 2019-12-13 DIAGNOSIS — N189 Chronic kidney disease, unspecified: Secondary | ICD-10-CM | POA: Diagnosis not present

## 2019-12-13 DIAGNOSIS — R809 Proteinuria, unspecified: Secondary | ICD-10-CM | POA: Diagnosis not present

## 2019-12-13 DIAGNOSIS — E559 Vitamin D deficiency, unspecified: Secondary | ICD-10-CM | POA: Diagnosis not present

## 2019-12-13 DIAGNOSIS — I5032 Chronic diastolic (congestive) heart failure: Secondary | ICD-10-CM | POA: Diagnosis not present

## 2019-12-13 DIAGNOSIS — E1129 Type 2 diabetes mellitus with other diabetic kidney complication: Secondary | ICD-10-CM | POA: Diagnosis not present

## 2019-12-18 ENCOUNTER — Ambulatory Visit (INDEPENDENT_AMBULATORY_CARE_PROVIDER_SITE_OTHER): Payer: PPO | Admitting: Ophthalmology

## 2019-12-18 ENCOUNTER — Encounter (INDEPENDENT_AMBULATORY_CARE_PROVIDER_SITE_OTHER): Payer: Self-pay | Admitting: Ophthalmology

## 2019-12-18 ENCOUNTER — Other Ambulatory Visit: Payer: Self-pay

## 2019-12-18 DIAGNOSIS — H34831 Tributary (branch) retinal vein occlusion, right eye, with macular edema: Secondary | ICD-10-CM

## 2019-12-18 MED ORDER — BEVACIZUMAB CHEMO INJECTION 1.25MG/0.05ML SYRINGE FOR KALEIDOSCOPE
1.2500 mg | INTRAVITREAL | Status: AC | PRN
  Administered 2019-12-18: 1.25 mg via INTRAVITREAL

## 2019-12-18 NOTE — Progress Notes (Signed)
12/18/2019     CHIEF COMPLAINT Patient presents for Retina Follow Up   HISTORY OF PRESENT ILLNESS: Virginia Huber is a 74 y.o. female who presents to the clinic today for:   HPI    Retina Follow Up    Patient presents with  CRVO/BRVO.  In right eye.  Duration of 5 weeks.  Since onset it is stable.          Comments    5 week f.u - OCT OU, Poss Avastin OD Patient denies change in vision and overall has no complaints.        Last edited by Gerda Diss on 12/18/2019 10:17 AM. (History)      Referring physician: Sharilyn Sites, MD [address] [place],  [place] [address]  HISTORICAL INFORMATION:   Selected notes from the MEDICAL RECORD NUMBER    Lab Results  Component Value Date   HGBA1C 7.0 (H) 08/24/2019     CURRENT MEDICATIONS: No current outpatient medications on file. (Ophthalmic Drugs)   No current facility-administered medications for this visit. (Ophthalmic Drugs)   Current Outpatient Medications (Other)  Medication Sig  . acetaminophen (TYLENOL) 325 MG tablet Take 650 mg by mouth every 6 (six) hours as needed for headache.  [name] amiodarone (PACERONE) 200 MG tablet Take 0.5 tablets (100 mg total) by mouth daily.  [name] apixaban (ELIQUIS) 5 MG TABS tablet Take 1 tablet (5 mg total) by mouth 2 (two) times daily.  [name] atorvastatin (LIPITOR) 80 MG tablet TAKE ONE TABLET BY MOUTH EVERY DAY AT 6:00PM  . carvedilol (COREG) 25 MG tablet Take 1 tablet (25 mg total) by mouth 2 (two) times daily.  . clopidogrel (PLAVIX) 75 MG tablet TAKE ONE TABLET (75MG  TOTAL) BY MOUTH DAILY  . cyanocobalamin (,VITAMIN B-12,) 1000 MCG/ML injection Inject 1,000 mcg into the muscle every 30 (thirty) days. Last injection was around 08/02/19  . diltiazem (CARDIZEM CD) 360 MG 24 hr capsule Take 1 capsule (360 mg total) by mouth daily.  [name] diltiazem (TIAZAC) 360 MG 24 hr capsule Take 360 mg by mouth daily.  [name] FREESTYLE LITE test strip USE AS DIRECTED TWICEODAILY.  . furosemide (LASIX) 40 MG  tablet TAKE ONE AND HALF TABLET (60MG  TOTAL) BY MOUTH IN THE MORNING AND ONE TABLET IN THE EVENING (40MG  TOTAL)  . insulin NPH-regular Human (NOVOLIN 70/30) (70-30) 100 UNIT/ML injection Inject 40 Units into the skin See admin instructions. Inject 25 units before breakfast and 25 units before supper  - only if pre-meal blood glucose is above 90 mg/dL. (Patient taking differently: Inject 40 Units into the skin See admin instructions. Inject 30 units before breakfast and 30 units before supper  - only if pre-meal blood glucose is above 90 mg/dL.)  . levothyroxine (SYNTHROID) 137 MCG tablet Take 137 mcg by mouth daily before breakfast.  . nitroGLYCERIN (NITROSTAT) 0.4 MG SL tablet Place 1 tablet (0.4 mg total) under the tongue every 5 (five) minutes as needed for chest pain.  [name] olmesartan (BENICAR) 40 MG tablet Take 40 mg by mouth daily.  . Potassium Chloride ER 20 MEQ TBCR Take 1 tablet by mouth at bedtime.  . potassium chloride SA (KLOR-CON) 20 MEQ tablet TAKE ONE (1) TABLET BY MOUTH EVERY DAY  . rOPINIRole (REQUIP) 0.5 MG tablet Take 0.5 mg by mouth at bedtime.   . Vitamin D, Ergocalciferol, (DRISDOL) 1.25 MG (50000 UNIT) CAPS capsule Take 50,000 Units by mouth every 7 (seven) days. On tuesdays   No current facility-administered medications  for this visit. (Other)      REVIEW OF SYSTEMS:    ALLERGIES Allergies  Allergen Reactions  . Penicillins Hives    Has patient had a PCN reaction causing immediate rash, facial/tongue/throat swelling, SOB or lightheadedness with hypotension: Yes Has patient had a PCN reaction causing severe rash involving mucus membranes or skin necrosis: No Has patient had a PCN reaction that required hospitalization No Has patient had a PCN reaction occurring within the last 10 years: No If all of the above answers are "NO", then may proceed with Cephalosporin use.  [name] Percocet [Oxycodone-Acetaminophen] Nausea And Vomiting    PAST MEDICAL HISTORY Past Medical  History:  Diagnosis Date  . Arthritis   . Atrial fibrillation and flutter ([place])    a. h/o PAF/flutter during admission in 2013 for PNA. b. PAF during adm for NSTEMI 07/2015, subsequent paroxysms since then.  . B12 deficiency anemia   . Cardiac tamponade 06/2016  . CHF (congestive heart failure) ([place])   . Coronary artery disease 11/30/2014   a. remote MI. b. h/o PTCA with scoring balloon to OM1 11/2014. c. NSTEMI 03/2015 s/p DES to prox-mid Cx. d. NSTEMI 07/2015 s/p scoring balloon/PTCA/DES to dRCA with PAF during that admission  . Cutaneous lupus erythematosus   . GERD (gastroesophageal reflux disease)   . Heart block   . History of blood transfusion 1980's   2nd surgical procedures  . HTN (hypertension)   . Hypercholesteremia   . Hypothyroidism   . Myocardial infarction ([place]) 02/2012  . Ovarian tumor   . PAD (peripheral artery disease) ([place])    a. s/p LE angio 2015; followed by Dr. Fletcher Anon - managed medically.  . Pain with urination 05/08/2015  . Paroxysmal atrial flutter ([place])   . Pericardial effusion    a. 06/2016 after ppm - s/p pericardiocentesis.  . Superficial fungus infection of skin 06/29/2013  . Tachy-brady syndrome ([place])    a. s/p Medtronic PPM 06/2016, c/b lead perf/pericardial effusion.  [name] TIA (transient ischemic attack) 08/2001; ~ 2006  . Type II diabetes mellitus ([place])   . UTI (urinary tract infection) 05/08/2013   Past Surgical History:  Procedure Laterality Date  . ABDOMINAL AORTAGRAM N/A 01/03/2014   Procedure: ABDOMINAL [name];  Surgeon: Wellington Hampshire, MD;  Location: [facility] CATH LAB;  Service: Cardiovascular;  Laterality: N/A;  . ABDOMINAL HYSTERECTOMY  1972   "partial"  . APPENDECTOMY  1970's  . CARDIAC CATHETERIZATION  2008   Tiny OM-2 with 90% narrowing. Med tx.  [name] CARDIAC CATHETERIZATION N/A 11/30/2014   Procedure: Left Heart Cath and Coronary Angiography;  Surgeon: Troy Sine, MD; LAD 20%, CFX 50%, OM1 95%, right PLB 30%, LV normal   . CARDIAC  CATHETERIZATION N/A 11/30/2014   Procedure: Coronary Balloon Angioplasty;  Surgeon: Troy Sine, MD;  Angiosculpt scoring balloon and PTCA to the OM1 reducing stenosis from 95% to less than 10%  . CARDIAC CATHETERIZATION N/A 04/03/2015   Procedure: Left Heart Cath and Coronary Angiography;  Surgeon: Jolaine Artist, MD; dLAD 50%, CFX 90%, OM1 100%, PLA 15%, LVEDP 13    . CARDIAC CATHETERIZATION N/A 04/03/2015   Procedure: Coronary Stent Intervention;  Surgeon: Sherren Mocha, MD; 3.0x18 mm Xience DES to the CFX    . CARDIAC CATHETERIZATION N/A 08/02/2015   Procedure: Left Heart Cath and Coronary Angiography;  Surgeon: Troy Sine, MD;  Location: [facility] CV LAB;  Service: Cardiovascular;  Laterality: N/A;  . CARDIAC CATHETERIZATION N/A 08/02/2015   Procedure: Coronary  Stent Intervention;  Surgeon: Troy Sine, MD;  Location: [facility] CV LAB;  Service: Cardiovascular;  Laterality: N/A;  . CARDIAC CATHETERIZATION N/A 06/25/2016   Procedure: Pericardiocentesis;  Surgeon: Will Meredith Leeds, MD;  Location: [facility] CV LAB;  Service: Cardiovascular;  Laterality: N/A;  . cardiac stents    . CARDIOVERSION N/A 12/15/2017   Procedure: CARDIOVERSION;  Surgeon: Acie Fredrickson Wonda Cheng, MD;  Location[facility];  Service: Cardiovascular;  Laterality: N/A;  . CHOLECYSTECTOMY OPEN  1990's  . COLONOSCOPY  2005   Dr. Laural Golden: pancolonic divericula, polyp, path unknown currently  . COLONOSCOPY  2012   Dr. Oneida Alar: Normal TI, scattered diverticula in entire colon, small internal hemorrhoids, normal colon biopsies. Colonoscopy in 5-10 years.   . COLOSTOMY  05/1979  . COLOSTOMY REVERSAL  11/1979  . EP IMPLANTABLE DEVICE N/A 06/25/2016   Procedure: Lead Revision/Repair;  Surgeon: Will Meredith Leeds, MD;  Location: [facility] CV LAB;  Service: Cardiovascular;  Laterality: N/A;  . EP IMPLANTABLE DEVICE N/A 06/25/2016   Procedure: Pacemaker Implant;  Surgeon: Will Meredith Leeds, MD;  Location: [facility]  CV LAB;  Service: Cardiovascular;  Laterality: N/A;  . EXCISIONAL HEMORRHOIDECTOMY  1970's  . EYE SURGERY Left 2000   "branch vein occlusion"  . EYE SURGERY Left ~ 2001   "smoothed out wrinkle"  . LEFT OOPHORECTOMY  05/1979   nicked bowel, peritonitis, colostomy; colostomy reversed 1981   . LOWER EXTREMITY ANGIOGRAM N/A 01/03/2014   Procedure: LOWER EXTREMITY ANGIOGRAM;  Surgeon: Wellington Hampshire, MD;  Location: [facility] CATH LAB;  Service: Cardiovascular;  Laterality: N/A;  . Nuclear med stress test  10/2011   Small area of mild ischemia inferoapically.  [name] PARTIAL HYSTERECTOMY  1970's   left ovaries, then ovaries removed later due tumors   . RIGHT OOPHORECTOMY  1970's    FAMILY HISTORY Family History  Problem Relation Age of Onset  . Heart disease Mother        deceased  . Heart disease Father        deceased, heart disease  . Diabetes Brother   . Heart disease Brother   . Thyroid disease Brother   . Heart disease Sister   . Heart disease Brother   . Thyroid disease Brother   . Lupus Daughter   . Colon cancer Neg Hx     SOCIAL HISTORY Social History   Tobacco Use  . Smoking status: Never Smoker  . Smokeless tobacco: Never Used  . Tobacco comment: Never smoked  Vaping Use  . Vaping Use: Never used  Substance Use Topics  . Alcohol use: No    Alcohol/week: 0.0 standard drinks  . Drug use: No         OPHTHALMIC EXAM:  Base Eye Exam    Visual Acuity (Snellen - Linear)      Right Left   Dist cc 20/40 CF @ 1'   Dist ph cc NI NI       Tonometry (Tonopen, 10:20 AM)      Right Left   Pressure 10 12       Pupils      Pupils Dark Light Shape React APD   Right PERRL 6 4 Round Brisk None   Left PERRL 6 6 Round Sluggish +2       Visual Fields (Counting fingers)      Left Right     Full   Restrictions Total inferior temporal, inferior nasal deficiencies  Extraocular Movement      Right Left    Full Full       Neuro/Psych    Oriented x3: Yes    Mood/Affect: Normal       Dilation    Right eye: 1.0% Mydriacyl, 2.5% Phenylephrine @ 10:20 AM        Slit Lamp and Fundus Exam    External Exam      Right Left   External Normal Normal       Slit Lamp Exam      Right Left   Lids/Lashes Normal Normal   Conjunctiva/Sclera White and quiet White and quiet   Cornea Clear Clear   Anterior Chamber Deep and quiet Deep and quiet   Iris Round and reactive Round and reactive   Lens Posterior chamber intraocular lens Posterior chamber intraocular lens   Anterior Vitreous Normal Normal       Fundus Exam      Right Left   Posterior Vitreous Posterior vitreous detachment    Disc Normal    C/D Ratio 0.25    Macula Hard drusen, Cystoid macular edema, Microaneurysms    Vessels Old branch retinal vein occlusion    Periphery Normal           IMAGING AND PROCEDURES  Imaging and Procedures for 12/18/19  OCT, Retina - OU - Both Eyes       Right Eye Quality was good. Scan locations included subfoveal. Central Foveal Thickness: 231. Progression has been stable. Findings include no SRF, no IRF.   Left Eye Quality was good. Scan locations included subfoveal. Central Foveal Thickness: 197. Progression has been stable. Findings include outer retinal atrophy, central retinal atrophy, subretinal scarring.   Notes OD, the with perifoveal CME, stable, overall improved while on therapy, will repeat intravitreal Avastin OD today and examination in 5 weeks       Intravitreal Injection, Pharmacologic Agent - OD - Right Eye       Time Out 12/18/2019. 11:23 AM. Confirmed correct patient, procedure, site, and patient consented.   Anesthesia Topical anesthesia was used. Anesthetic medications included Akten 3.5%.   Procedure Preparation included Ofloxacin , 10% betadine to eyelids.   Injection:  1.25 mg Bevacizumab (AVASTIN) SOLN   NDC: [address]-8, Lot: [id number]   Route: Intravitreal, Site: Right Eye, Waste: 0 mg  Post-op Post  injection exam found visual acuity of at least counting fingers. The patient tolerated the procedure well. There were no complications. The patient received written and verbal post procedure care education. Post injection medications were not given.                 ASSESSMENT/PLAN:  No problem-specific Assessment & Plan notes found for this encounter.      ICD-10-CM   1. Branch retinal vein occlusion with macular edema of right eye  H34.8310 OCT, Retina - OU - Both Eyes    Intravitreal Injection, Pharmacologic Agent - OD - Right Eye    Bevacizumab (AVASTIN) SOLN 1.25 mg    1.  2.  3.  Ophthalmic Meds Ordered this visit:  Meds ordered this encounter  Medications  . Bevacizumab (AVASTIN) SOLN 1.25 mg       Return in about 5 weeks (around 01/22/2020) for dilate, OD, AVASTIN OCT.  There are no Patient Instructions on file for this visit.   Explained the diagnoses, plan, and follow up with the patient and they expressed understanding.  Patient expressed understanding of the importance of proper follow up  care.   Clent Demark. Yuriy Cui M.D. Diseases & Surgery of the Retina and Vitreous Retina & Diabetic [place] 12/18/19     Abbreviations: M myopia (nearsighted); A astigmatism; H hyperopia (farsighted); P presbyopia; Mrx spectacle prescription;  CTL contact lenses; OD right eye; OS left eye; OU both eyes  XT exotropia; ET esotropia; PEK punctate epithelial keratitis; PEE punctate epithelial erosions; DES dry eye syndrome; MGD meibomian gland dysfunction; ATs artificial tears; PFAT's preservative free artificial tears; [place] nuclear sclerotic cataract; PSC posterior subcapsular cataract; ERM epi-retinal membrane; PVD posterior vitreous detachment; RD retinal detachment; DM diabetes mellitus; DR diabetic retinopathy; NPDR non-proliferative diabetic retinopathy; PDR proliferative diabetic retinopathy; CSME clinically significant macular edema; DME diabetic macular edema; dbh dot blot  hemorrhages; CWS cotton wool spot; POAG primary open angle glaucoma; C/D cup-to-disc ratio; HVF humphrey visual field; GVF goldmann visual field; OCT optical coherence tomography; IOP intraocular pressure; BRVO Branch retinal vein occlusion; CRVO central retinal vein occlusion; CRAO central retinal artery occlusion; BRAO branch retinal artery occlusion; RT retinal tear; SB scleral buckle; PPV pars plana vitrectomy; VH Vitreous hemorrhage; PRP panretinal laser photocoagulation; IVK intravitreal kenalog; VMT vitreomacular traction; MH Macular hole;  NVD neovascularization of the disc; NVE neovascularization elsewhere; AREDS age related eye disease study; ARMD age related macular degeneration; POAG primary open angle glaucoma; EBMD epithelial/anterior basement membrane dystrophy; ACIOL anterior chamber intraocular lens; IOL intraocular lens; PCIOL posterior chamber intraocular lens; Phaco/IOL phacoemulsification with intraocular lens placement; [place] photorefractive keratectomy; LASIK laser assisted in situ keratomileusis; HTN hypertension; DM diabetes mellitus; COPD chronic obstructive pulmonary disease

## 2019-12-19 ENCOUNTER — Ambulatory Visit: Payer: PPO | Admitting: Sports Medicine

## 2019-12-19 ENCOUNTER — Other Ambulatory Visit: Payer: Self-pay

## 2019-12-19 ENCOUNTER — Encounter: Payer: Self-pay | Admitting: Sports Medicine

## 2019-12-19 DIAGNOSIS — M79675 Pain in left toe(s): Secondary | ICD-10-CM

## 2019-12-19 DIAGNOSIS — E119 Type 2 diabetes mellitus without complications: Secondary | ICD-10-CM | POA: Diagnosis not present

## 2019-12-19 DIAGNOSIS — B351 Tinea unguium: Secondary | ICD-10-CM | POA: Diagnosis not present

## 2019-12-19 DIAGNOSIS — M79674 Pain in right toe(s): Secondary | ICD-10-CM | POA: Diagnosis not present

## 2019-12-19 DIAGNOSIS — Z7901 Long term (current) use of anticoagulants: Secondary | ICD-10-CM

## 2019-12-19 NOTE — Progress Notes (Signed)
Subjective: Virginia Huber is a 74 y.o. female patient with history of diabetes who presents to office today complaining of long,mildly painful nails while ambulating in shoes; unable to trim. Patient states that the glucose reading this morning was 196 and last A1c was 7 slightly elevated from before because her primary doctor has took her off Metformin due to kidney issues and only on insulin right now with elevation in sugars noted as they are trying to get this better.  Reports that she goes to the nephrologist for next week.  No other issues noted.  Patient Active Problem List   Diagnosis Date Noted  . Branch retinal vein occlusion with macular edema of right eye 10/11/2019  . Severe nonproliferative diabetic retinopathy of right eye, with macular edema, associated with type 2 diabetes mellitus ([place]) 10/11/2019  . Right retinal defect 10/11/2019  . Posterior vitreous detachment of right eye 10/11/2019  . Retinal microaneurysm of right eye 10/11/2019  . CHF exacerbation ([place]) 08/25/2019  . Acute exacerbation of CHF (congestive heart failure) ([place]) 08/24/2019  . Acute on chronic diastolic HF (heart failure) ([place]) 12/17/2017  . GERD (gastroesophageal reflux disease) 12/17/2017  . Typical atrial flutter ([place])   . 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 Paroxysmal atrial fibrillation ([place]) 12/08/2017  . 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 Chronic renal failure, stage 3b 09/23/2017  . Fever 09/23/2017  . S/P pericardiocentesis 06/28/2016  . Acute blood loss anemia 06/28/2016  . Pericardial effusion 06/26/2016  . Tachy-brady syndrome ([place]) 06/25/2016  . Tamponade   . Bradycardia 06/14/2016  . Junctional bradycardia   . Coronary artery disease involving coronary bypass graft of native heart with unstable angina pectoris ([place])   . Acute diastolic CHF (congestive heart failure), NYHA class 3 ([place])   . Systolic congestive heart failure ([place])  05/13/2016  . Multiple and bilateral precerebral artery syndromes 05/13/2016  . OSA (obstructive sleep apnea) 05/13/2016  . Chronic diarrhea 12/25/2015  . Chest pain 08/02/2015  . Atrial fibrillation with rapid ventricular response ([place])   . Pain with urination 05/08/2015  . NSTEMI (non-ST elevated myocardial infarction) ([place]) 04/02/2015  . CAD in native artery 11/30/2014  . 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   . PAD (peripheral artery disease) ([place]) 12/26/2013  . Superficial fungus infection of skin 06/29/2013  . UTI (urinary tract infection) 05/08/2013  . Hypokalemia 03/05/2012  . B12 deficiency anemia 03/02/2012  . Bronchospasm 03/02/2012  . Community acquired bacterial pneumonia 03/01/2012  . Acute respiratory failure with hypoxia ([place]) 03/01/2012  . Type 2 diabetes with nephropathy ([place]) 02/29/2012  . Hypothyroidism 02/28/2012  . RLQ abdominal pain 11/24/2010  . OVERWEIGHT/OBESITY 06/03/2010  . Essential hypertension 06/03/2010  . Mixed hyperlipidemia 12/27/2009  . Palpitations 05/17/2009  . FRACTURE, TOE 12/06/2007   Current Outpatient Medications on File Prior to Visit  Medication Sig Dispense Refill  . acetaminophen (TYLENOL) 325 MG tablet Take 650 mg by mouth every 6 (six) hours as needed for headache.    [name] amiodarone (PACERONE) 200 MG tablet Take 0.5 tablets (100 mg total) by mouth daily. 45 tablet 1  . apixaban (ELIQUIS) 5 MG TABS tablet Take 1 tablet (5 mg total) by mouth 2 (two) times daily. 28 tablet 0  . atorvastatin (LIPITOR) 80 MG tablet TAKE ONE TABLET BY MOUTH EVERY DAY AT 6:00PM 90 tablet 3  . carvedilol (COREG) 25 MG tablet Take 1 tablet (25 mg total) by  mouth 2 (two) times daily. 90 tablet 1  . clopidogrel (PLAVIX) 75 MG tablet TAKE ONE TABLET (75MG  TOTAL) BY MOUTH DAILY 90 tablet 3  . cyanocobalamin (,VITAMIN B-12,) 1000 MCG/ML injection Inject 1,000 mcg into the muscle every 30 (thirty) days. Last  injection was around 08/02/19    . diltiazem (CARDIZEM CD) 360 MG 24 hr capsule Take 1 capsule (360 mg total) by mouth daily. 90 capsule 3  . diltiazem (TIAZAC) 360 MG 24 hr capsule Take 360 mg by mouth daily.    [name] FREESTYLE LITE test strip USE AS DIRECTED TWICEODAILY.    . furosemide (LASIX) 40 MG tablet TAKE ONE AND HALF TABLET (60MG  TOTAL) BY MOUTH IN THE MORNING AND ONE TABLET IN THE EVENING (40MG  TOTAL) 180 tablet 4  . insulin NPH-regular Human (NOVOLIN 70/30) (70-30) 100 UNIT/ML injection Inject 40 Units into the skin See admin instructions. Inject 25 units before breakfast and 25 units before supper  - only if pre-meal blood glucose is above 90 mg/dL. (Patient taking differently: Inject 40 Units into the skin See admin instructions. Inject 30 units before breakfast and 30 units before supper  - only if pre-meal blood glucose is above 90 mg/dL.) 10 mL 11  . levothyroxine (SYNTHROID) 137 MCG tablet Take 137 mcg by mouth daily before breakfast.    . nitroGLYCERIN (NITROSTAT) 0.4 MG SL tablet Place 1 tablet (0.4 mg total) under the tongue every 5 (five) minutes as needed for chest pain. 25 tablet 0  . olmesartan (BENICAR) 40 MG tablet Take 40 mg by mouth daily.    . Potassium Chloride ER 20 MEQ TBCR Take 1 tablet by mouth at bedtime.    . potassium chloride SA (KLOR-CON) 20 MEQ tablet TAKE ONE (1) TABLET BY MOUTH EVERY DAY 90 tablet 1  . rOPINIRole (REQUIP) 0.5 MG tablet Take 0.5 mg by mouth at bedtime.   0  . Vitamin D, Ergocalciferol, (DRISDOL) 1.25 MG (50000 UNIT) CAPS capsule Take 50,000 Units by mouth every 7 (seven) days. On tuesdays     No current facility-administered medications on file prior to visit.   Allergies  Allergen Reactions  . Penicillins Hives    Has patient had a PCN reaction causing immediate rash, facial/tongue/throat swelling, SOB or lightheadedness with hypotension: Yes Has patient had a PCN reaction causing severe rash involving mucus membranes or skin necrosis:  No Has patient had a PCN reaction that required hospitalization No Has patient had a PCN reaction occurring within the last 10 years: No If all of the above answers are "NO", then may proceed with Cephalosporin use.  [name] Percocet [Oxycodone-Acetaminophen] Nausea And Vomiting     Objective: General: Patient is awake, alert, and oriented x 3 and in no acute distress.  Integument: Skin is warm, dry and supple bilateral. Nails are tender, long, thickened and dystrophic with subungual debris, consistent with onychomycosis, 1-5 bilateral. Mild curvature of bilateral hallux nails with no acute ingrowing, No signs of infection. No open lesions or preulcerative lesions present bilateral. Remaining integument unremarkable.  Vasculature:  Dorsalis Pedis pulse 1/4 bilateral. Posterior Tibial pulse  0/4 bilateral.  Capillary fill time <3 sec 1-5 bilateral. Positive hair growth to the level of the digits. Temperature gradient within normal limits. No varicosities present bilateral. Trace edema present bilateral.   Neurology: Gross sensation present via light touch bilateral.  Protective sensation intact with Semmes Weinstein monofilament bilateral.  Musculoskeletal: Asymptomatic bunion and hammertoe with cross over R>L pedal deformities noted bilateral.  Strength within  normal limits.  No other acute findings or tenderness to palpation bilateral.  Assessment and Plan: Problem List Items Addressed This Visit    None    Visit Diagnoses    Pain due to onychomycosis of toenails of both feet    -  Primary   Diabetes mellitus without complication ([place])       Anticoagulated         -Examined patient. -Mechanically debrided all nails 1-5 bilateral using sterile nail nipper and filed with dremel without incident  -Advised good supportive shoes daily for foot type -Continue with medical management of diabetes and follow-up with nephrologist and endocrinologist for insulin management -Answered all patient  questions -Patient to return  in 3 months for at risk foot care -Patient advised to call the office if any problems or questions arise in the meantime.  Landis Martins, DPM

## 2019-12-20 DIAGNOSIS — I5032 Chronic diastolic (congestive) heart failure: Secondary | ICD-10-CM | POA: Diagnosis not present

## 2019-12-20 DIAGNOSIS — E1122 Type 2 diabetes mellitus with diabetic chronic kidney disease: Secondary | ICD-10-CM | POA: Diagnosis not present

## 2019-12-20 DIAGNOSIS — I130 Hypertensive heart and chronic kidney disease with heart failure and stage 1 through stage 4 chronic kidney disease, or unspecified chronic kidney disease: Secondary | ICD-10-CM | POA: Diagnosis not present

## 2019-12-20 DIAGNOSIS — N184 Chronic kidney disease, stage 4 (severe): Secondary | ICD-10-CM | POA: Diagnosis not present

## 2019-12-21 DIAGNOSIS — S838X2A Sprain of other specified parts of left knee, initial encounter: Secondary | ICD-10-CM | POA: Diagnosis not present

## 2019-12-22 DIAGNOSIS — R809 Proteinuria, unspecified: Secondary | ICD-10-CM | POA: Diagnosis not present

## 2019-12-22 DIAGNOSIS — E211 Secondary hyperparathyroidism, not elsewhere classified: Secondary | ICD-10-CM | POA: Diagnosis not present

## 2019-12-22 DIAGNOSIS — I129 Hypertensive chronic kidney disease with stage 1 through stage 4 chronic kidney disease, or unspecified chronic kidney disease: Secondary | ICD-10-CM | POA: Diagnosis not present

## 2019-12-22 DIAGNOSIS — I5032 Chronic diastolic (congestive) heart failure: Secondary | ICD-10-CM | POA: Diagnosis not present

## 2019-12-22 DIAGNOSIS — E1129 Type 2 diabetes mellitus with other diabetic kidney complication: Secondary | ICD-10-CM | POA: Diagnosis not present

## 2019-12-22 DIAGNOSIS — E1122 Type 2 diabetes mellitus with diabetic chronic kidney disease: Secondary | ICD-10-CM | POA: Diagnosis not present

## 2019-12-22 DIAGNOSIS — N189 Chronic kidney disease, unspecified: Secondary | ICD-10-CM | POA: Diagnosis not present

## 2019-12-27 DIAGNOSIS — E663 Overweight: Secondary | ICD-10-CM | POA: Diagnosis not present

## 2019-12-27 DIAGNOSIS — E1165 Type 2 diabetes mellitus with hyperglycemia: Secondary | ICD-10-CM | POA: Diagnosis not present

## 2019-12-27 DIAGNOSIS — Z6828 Body mass index (BMI) 28.0-28.9, adult: Secondary | ICD-10-CM | POA: Diagnosis not present

## 2020-01-05 DIAGNOSIS — E1129 Type 2 diabetes mellitus with other diabetic kidney complication: Secondary | ICD-10-CM | POA: Diagnosis not present

## 2020-01-05 DIAGNOSIS — E1122 Type 2 diabetes mellitus with diabetic chronic kidney disease: Secondary | ICD-10-CM | POA: Diagnosis not present

## 2020-01-05 DIAGNOSIS — R809 Proteinuria, unspecified: Secondary | ICD-10-CM | POA: Diagnosis not present

## 2020-01-05 DIAGNOSIS — N189 Chronic kidney disease, unspecified: Secondary | ICD-10-CM | POA: Diagnosis not present

## 2020-01-05 DIAGNOSIS — I5032 Chronic diastolic (congestive) heart failure: Secondary | ICD-10-CM | POA: Diagnosis not present

## 2020-01-05 DIAGNOSIS — I129 Hypertensive chronic kidney disease with stage 1 through stage 4 chronic kidney disease, or unspecified chronic kidney disease: Secondary | ICD-10-CM | POA: Diagnosis not present

## 2020-01-08 ENCOUNTER — Encounter: Payer: Self-pay | Admitting: Endocrinology

## 2020-01-08 ENCOUNTER — Other Ambulatory Visit: Payer: Self-pay

## 2020-01-09 ENCOUNTER — Ambulatory Visit (INDEPENDENT_AMBULATORY_CARE_PROVIDER_SITE_OTHER): Payer: PPO | Admitting: *Deleted

## 2020-01-09 DIAGNOSIS — I495 Sick sinus syndrome: Secondary | ICD-10-CM

## 2020-01-09 LAB — CUP PACEART REMOTE DEVICE CHECK
Battery Remaining Longevity: 75 mo
Battery Voltage: 3.01 V
Brady Statistic AP VP Percent: 0.08 %
Brady Statistic AP VS Percent: 98.68 %
Brady Statistic AS VP Percent: 0.11 %
Brady Statistic AS VS Percent: 1.13 %
Brady Statistic RA Percent Paced: 98.69 %
Brady Statistic RV Percent Paced: 0.2 %
Date Time Interrogation Session: 20210720082046
Implantable Lead Implant Date: 20180104
Implantable Lead Implant Date: 20180104
Implantable Lead Location: 753859
Implantable Lead Location: 753860
Implantable Lead Model: 5076
Implantable Lead Model: 5076
Implantable Pulse Generator Implant Date: 20180104
Lead Channel Impedance Value: 304 Ohm
Lead Channel Impedance Value: 342 Ohm
Lead Channel Impedance Value: 361 Ohm
Lead Channel Impedance Value: 456 Ohm
Lead Channel Pacing Threshold Amplitude: 0.75 V
Lead Channel Pacing Threshold Amplitude: 1.125 V
Lead Channel Pacing Threshold Pulse Width: 0.4 ms
Lead Channel Pacing Threshold Pulse Width: 0.4 ms
Lead Channel Sensing Intrinsic Amplitude: 1.75 mV
Lead Channel Sensing Intrinsic Amplitude: 1.75 mV
Lead Channel Sensing Intrinsic Amplitude: 12.5 mV
Lead Channel Sensing Intrinsic Amplitude: 12.5 mV
Lead Channel Setting Pacing Amplitude: 2 V
Lead Channel Setting Pacing Amplitude: 2.5 V
Lead Channel Setting Pacing Pulse Width: 0.4 ms
Lead Channel Setting Sensing Sensitivity: 2.8 mV

## 2020-01-11 NOTE — Progress Notes (Signed)
Remote pacemaker transmission.   

## 2020-01-15 ENCOUNTER — Ambulatory Visit: Payer: PPO | Admitting: Cardiovascular Disease

## 2020-01-16 ENCOUNTER — Telehealth: Payer: Self-pay | Admitting: Cardiology

## 2020-01-16 NOTE — Telephone Encounter (Signed)
      I went in pt's chart to see who pt talked to last

## 2020-01-19 ENCOUNTER — Ambulatory Visit: Payer: PPO | Admitting: Student

## 2020-01-19 DIAGNOSIS — I130 Hypertensive heart and chronic kidney disease with heart failure and stage 1 through stage 4 chronic kidney disease, or unspecified chronic kidney disease: Secondary | ICD-10-CM | POA: Diagnosis not present

## 2020-01-19 DIAGNOSIS — E1122 Type 2 diabetes mellitus with diabetic chronic kidney disease: Secondary | ICD-10-CM | POA: Diagnosis not present

## 2020-01-19 DIAGNOSIS — N184 Chronic kidney disease, stage 4 (severe): Secondary | ICD-10-CM | POA: Diagnosis not present

## 2020-01-19 DIAGNOSIS — I5032 Chronic diastolic (congestive) heart failure: Secondary | ICD-10-CM | POA: Diagnosis not present

## 2020-01-22 ENCOUNTER — Encounter (INDEPENDENT_AMBULATORY_CARE_PROVIDER_SITE_OTHER): Payer: Self-pay | Admitting: Ophthalmology

## 2020-01-22 ENCOUNTER — Ambulatory Visit (INDEPENDENT_AMBULATORY_CARE_PROVIDER_SITE_OTHER): Payer: PPO | Admitting: Ophthalmology

## 2020-01-22 ENCOUNTER — Other Ambulatory Visit: Payer: Self-pay

## 2020-01-22 DIAGNOSIS — H34831 Tributary (branch) retinal vein occlusion, right eye, with macular edema: Secondary | ICD-10-CM

## 2020-01-22 LAB — CUP PACEART INCLINIC DEVICE CHECK
Brady Statistic AP VP Percent: 0.1 % — CL
Brady Statistic AP VS Percent: 91.8 %
Brady Statistic AS VP Percent: 0.1 % — CL
Brady Statistic AS VS Percent: 8.1 %
Date Time Interrogation Session: 20210223172350
Implantable Lead Implant Date: 20180104
Implantable Lead Implant Date: 20180104
Implantable Lead Location: 753859
Implantable Lead Location: 753860
Implantable Lead Model: 5076
Implantable Lead Model: 5076
Implantable Pulse Generator Implant Date: 20180104
Lead Channel Pacing Threshold Amplitude: 0.75 V
Lead Channel Pacing Threshold Amplitude: 1.25 V
Lead Channel Pacing Threshold Pulse Width: 0.4 ms
Lead Channel Pacing Threshold Pulse Width: 0.4 ms
Lead Channel Sensing Intrinsic Amplitude: 11.8 mV
Lead Channel Sensing Intrinsic Amplitude: 11.8 mV

## 2020-01-22 MED ORDER — BEVACIZUMAB CHEMO INJECTION 1.25MG/0.05ML SYRINGE FOR KALEIDOSCOPE
1.2500 mg | INTRAVITREAL | Status: AC | PRN
  Administered 2020-01-22: 1.25 mg via INTRAVITREAL

## 2020-01-22 NOTE — Progress Notes (Signed)
01/22/2020     CHIEF COMPLAINT Patient presents for Retina Follow Up   HISTORY OF PRESENT ILLNESS: Virginia Huber is a 74 y.o. female who presents to the clinic today for:   HPI    Retina Follow Up    Patient presents with  CRVO/BRVO.  In right eye.  This started 5 weeks ago.  Severity is mild.  Duration of 5 weeks.  Since onset it is stable.          Comments    5 Week BRVO F/U OD, poss Avastin OD  Pt reports blurry VA OD with "swirls" today. Pt denies changes to VA OS. No other new symptoms reported OU.       Last edited by Rockie Neighbours, [place] on 01/22/2020 10:06 AM. (History)      Referring physician: Sharilyn Sites, MD [address] [place],  [place] [address]  HISTORICAL INFORMATION:   Selected notes from the MEDICAL RECORD NUMBER    Lab Results  Component Value Date   HGBA1C 7.7 12/04/2019     CURRENT MEDICATIONS: No current outpatient medications on file. (Ophthalmic Drugs)   No current facility-administered medications for this visit. (Ophthalmic Drugs)   Current Outpatient Medications (Other)  Medication Sig  . acetaminophen (TYLENOL) 325 MG tablet Take 650 mg by mouth every 6 (six) hours as needed for headache.  [name] amiodarone (PACERONE) 200 MG tablet Take 0.5 tablets (100 mg total) by mouth daily.  [name] apixaban (ELIQUIS) 5 MG TABS tablet Take 1 tablet (5 mg total) by mouth 2 (two) times daily.  [name] atorvastatin (LIPITOR) 80 MG tablet TAKE ONE TABLET BY MOUTH EVERY DAY AT 6:00PM  . carvedilol (COREG) 25 MG tablet Take 1 tablet (25 mg total) by mouth 2 (two) times daily.  . clopidogrel (PLAVIX) 75 MG tablet TAKE ONE TABLET (75MG  TOTAL) BY MOUTH DAILY  . cyanocobalamin (,VITAMIN B-12,) 1000 MCG/ML injection Inject 1,000 mcg into the muscle every 30 (thirty) days. Last injection was around 08/02/19  . diltiazem (CARDIZEM CD) 360 MG 24 hr capsule Take 1 capsule (360 mg total) by mouth daily.  [name] diltiazem (TIAZAC) 360 MG 24 hr capsule Take 360 mg by mouth daily.   [name] FREESTYLE LITE test strip USE AS DIRECTED TWICEODAILY.  . furosemide (LASIX) 40 MG tablet TAKE ONE AND HALF TABLET (60MG  TOTAL) BY MOUTH IN THE MORNING AND ONE TABLET IN THE EVENING (40MG  TOTAL)  . hydrALAZINE (APRESOLINE) 25 MG tablet   . insulin NPH-regular Human (NOVOLIN 70/30) (70-30) 100 UNIT/ML injection Inject 40 Units into the skin See admin instructions. Inject 25 units before breakfast and 25 units before supper  - only if pre-meal blood glucose is above 90 mg/dL. (Patient taking differently: Inject 40 Units into the skin See admin instructions. Inject 30 units before breakfast and 30 units before supper  - only if pre-meal blood glucose is above 90 mg/dL.)  . levothyroxine (SYNTHROID) 137 MCG tablet Take 137 mcg by mouth daily before breakfast.  . nitroGLYCERIN (NITROSTAT) 0.4 MG SL tablet Place 1 tablet (0.4 mg total) under the tongue every 5 (five) minutes as needed for chest pain.  [name] olmesartan (BENICAR) 40 MG tablet Take 40 mg by mouth daily.  . Potassium Chloride ER 20 MEQ TBCR Take 1 tablet by mouth at bedtime.  . potassium chloride SA (KLOR-CON) 20 MEQ tablet TAKE ONE (1) TABLET BY MOUTH EVERY DAY  . rOPINIRole (REQUIP) 0.5 MG tablet Take 0.5 mg by mouth at bedtime.   [name]  Vitamin D, Ergocalciferol, (DRISDOL) 1.25 MG (50000 UNIT) CAPS capsule Take 50,000 Units by mouth every 7 (seven) days. On tuesdays   No current facility-administered medications for this visit. (Other)      REVIEW OF SYSTEMS:    ALLERGIES Allergies  Allergen Reactions  . Penicillins Hives    Has patient had a PCN reaction causing immediate rash, facial/tongue/throat swelling, SOB or lightheadedness with hypotension: Yes Has patient had a PCN reaction causing severe rash involving mucus membranes or skin necrosis: No Has patient had a PCN reaction that required hospitalization No Has patient had a PCN reaction occurring within the last 10 years: No If all of the above answers are "NO", then may  proceed with Cephalosporin use.  [name] Percocet [Oxycodone-Acetaminophen] Nausea And Vomiting    PAST MEDICAL HISTORY Past Medical History:  Diagnosis Date  . Arthritis   . Atrial fibrillation and flutter ([place])    a. h/o PAF/flutter during admission in 2013 for PNA. b. PAF during adm for NSTEMI 07/2015, subsequent paroxysms since then.  . B12 deficiency anemia   . Cardiac tamponade 06/2016  . CHF (congestive heart failure) ([facility])   . Coronary artery disease 11/30/2014   a. remote MI. b. h/o PTCA with scoring balloon to OM1 11/2014. c. NSTEMI 03/2015 s/p DES to prox-mid Cx. d. NSTEMI 07/2015 s/p scoring balloon/PTCA/DES to dRCA with PAF during that admission  . Cutaneous lupus erythematosus   . GERD (gastroesophageal reflux disease)   . Heart block   . History of blood transfusion 1980's   2nd surgical procedures  . HTN (hypertension)   . Hypercholesteremia   . Hypothyroidism   . Myocardial infarction ([place] [facility]) 02/2012  . Ovarian tumor   . PAD (peripheral artery disease) ([facility])    a. s/p LE angio 2015; followed by Dr. Fletcher Anon - managed medically.  . Pain with urination 05/08/2015  . Paroxysmal atrial flutter ([facility] [facility])   . Pericardial effusion    a. 06/2016 after ppm - s/p pericardiocentesis.  . Superficial fungus infection of skin 06/29/2013  . Tachy-brady syndrome ([facility])    a. s/p Medtronic PPM 06/2016, c/b lead perf/pericardial effusion.  [name] TIA (transient ischemic attack) 08/2001; ~ 2006  . Type II diabetes mellitus ([facility])   . UTI (urinary tract infection) 05/08/2013   Past Surgical History:  Procedure Laterality Date  . ABDOMINAL AORTAGRAM N/A 01/03/2014   Procedure: ABDOMINAL [name];  Surgeon: Wellington Hampshire, MD;  Location: [facility] CATH [facility];  Service: Cardiovascular;  Laterality: N/A;  . ABDOMINAL HYSTERECTOMY  1972   "partial"  . APPENDECTOMY  1970's  . CARDIAC CATHETERIZATION  2008   Tiny OM-2 with 90% narrowing. Med tx.  [name] CARDIAC CATHETERIZATION N/A 11/30/2014   Procedure: Left Heart Cath  and Coronary Angiography;  Surgeon: Troy Sine, MD; LAD 20%, CFX 50%, OM1 95%, right PLB 30%, LV normal   . CARDIAC CATHETERIZATION N/A 11/30/2014   Procedure: Coronary Balloon Angioplasty;  Surgeon: Troy Sine, MD;  Angiosculpt scoring balloon and PTCA to the OM1 reducing stenosis from 95% to less than 10%  . CARDIAC CATHETERIZATION N/A 04/03/2015   Procedure: Left Heart Cath and Coronary Angiography;  Surgeon: Jolaine Artist, MD; dLAD 50%, CFX 90%, OM1 100%, PLA 15%, LVEDP 13    . CARDIAC CATHETERIZATION N/A 04/03/2015   Procedure: Coronary Stent Intervention;  Surgeon: Sherren Mocha, MD; 3.0x18 mm Xience DES to the CFX    . CARDIAC CATHETERIZATION N/A 08/02/2015   Procedure: Left Heart Cath and Coronary Angiography;  Surgeon: Troy Sine, MD;  Location: [facility];  Service: Cardiovascular;  Laterality: N/A;  . CARDIAC CATHETERIZATION N/A 08/02/2015   Procedure: Coronary Stent Intervention;  Surgeon: Troy Sine, MD;  Location: [facility];  Service: Cardiovascular;  Laterality: N/A;  . CARDIAC CATHETERIZATION N/A 06/25/2016   Procedure: Pericardiocentesis;  Surgeon: Will Meredith Leeds, MD;  Location: [facility];  Service: Cardiovascular;  Laterality: N/A;  . cardiac stents    . CARDIOVERSION N/A 12/15/2017   Procedure: CARDIOVERSION;  Surgeon: Acie Fredrickson Wonda Cheng, MD;  Location: [facility];  Service: Cardiovascular;  Laterality: N/A;  . CHOLECYSTECTOMY OPEN  1990's  . COLONOSCOPY  2005   Dr. Laural Golden: pancolonic divericula, polyp, path unknown currently  . COLONOSCOPY  2012   Dr. Oneida Alar: Normal TI, scattered diverticula in entire colon, small internal hemorrhoids, normal colon biopsies. Colonoscopy in 5-10 years.   . COLOSTOMY  05/1979  . COLOSTOMY REVERSAL  11/1979  . EP IMPLANTABLE DEVICE N/A 06/25/2016   Procedure: Lead Revision/Repair;  Surgeon: Will Meredith Leeds, MD;  Location: [facility];  Service: Cardiovascular;  Laterality: N/A;  . EP  IMPLANTABLE DEVICE N/A 06/25/2016   Procedure: Pacemaker Implant;  Surgeon: Will Meredith Leeds, MD;  Location: [facility];  Service: Cardiovascular;  Laterality: N/A;  . EXCISIONAL HEMORRHOIDECTOMY  1970's  . EYE SURGERY Left 2000   "branch vein occlusion"  . EYE SURGERY Left ~ 2001   "smoothed out wrinkle"  . LEFT OOPHORECTOMY  05/1979   nicked bowel, peritonitis, colostomy; colostomy reversed 1981   . LOWER EXTREMITY ANGIOGRAM N/A 01/03/2014   Procedure: LOWER EXTREMITY ANGIOGRAM;  Surgeon: Wellington Hampshire, MD;  Location: [facility] CATH LAB;  Service: Cardiovascular;  Laterality: N/A;  . Nuclear med stress test  10/2011   Small area of mild ischemia inferoapically.  [name] PARTIAL HYSTERECTOMY  1970's   left ovaries, then ovaries removed later due tumors   . RIGHT OOPHORECTOMY  1970's    FAMILY HISTORY Family History  Problem Relation Age of Onset  . Heart disease Mother        deceased  . Heart disease Father        deceased, heart disease  . Diabetes Brother   . Heart disease Brother   . Thyroid disease Brother   . Heart disease Sister   . Heart disease Brother   . Thyroid disease Brother   . Lupus Daughter   . Colon cancer Neg Hx     SOCIAL HISTORY Social History   Tobacco Use  . Smoking status: Never Smoker  . Smokeless tobacco: Never Used  . Tobacco comment: Never smoked  Vaping Use  . Vaping Use: Never used  Substance Use Topics  . Alcohol use: No    Alcohol/week: 0.0 standard drinks  . Drug use: No         OPHTHALMIC EXAM:  Base Eye Exam    Visual Acuity (ETDRS)      Right Left   Dist cc 20/40 CF @ 1'   Dist ph cc NI NI   Correction: Glasses       Tonometry (Tonopen, 10:10 AM)      Right Left   Pressure 14 13       Pupils      Dark Light Shape React APD   Right 5 4 Round Brisk None   Left 5 5 Round Minimal +1       Visual Fields (Counting fingers)  Left Right     Full   Restrictions Total inferior temporal, inferior nasal  deficiencies        Extraocular Movement      Right Left    Full Full       Neuro/Psych    Oriented x3: Yes   Mood/Affect: Normal       Dilation    Right eye: 1.0% Mydriacyl, 2.5% Phenylephrine @ 10:10 AM        Slit Lamp and Fundus Exam    External Exam      Right Left   External Normal Normal       Slit Lamp Exam      Right Left   Lids/Lashes Normal Normal   Conjunctiva/Sclera White and quiet White and quiet   Cornea Clear Clear   Anterior Chamber Deep and quiet Deep and quiet   Iris Round and reactive Round and reactive   Lens Posterior chamber intraocular lens Posterior chamber intraocular lens   Anterior Vitreous Normal Normal       Fundus Exam      Right Left   Posterior Vitreous Posterior vitreous detachment    Disc Normal    C/D Ratio 0.2    Macula Hard drusen,  NO Cystoid macular edema, Microaneurysms, Retinal pigment epithelial mottling, no hemorrhage, no exudates    Vessels Old branch retinal vein occlusion    Periphery Normal           IMAGING AND PROCEDURES  Imaging and Procedures for 01/22/20  OCT, Retina - OU - Both Eyes       Right Eye Quality was good. Scan locations included subfoveal. Central Foveal Thickness: 218. Progression has improved. Findings include abnormal foveal contour, retinal drusen , subretinal hyper-reflective material.   Left Eye Scan locations included subfoveal. Central Foveal Thickness: 212. Findings include abnormal foveal contour, central retinal atrophy, outer retinal atrophy, inner retinal atrophy.   Notes With much less CME, currently at 5-week interval, repeat injection Avastin today and exam in 5 weeks       Intravitreal Injection, Pharmacologic Agent - OD - Right Eye       Time Out 01/22/2020. 11:14 AM. Confirmed correct patient, procedure, site, and patient consented.   Anesthesia Topical anesthesia was used.   Procedure Preparation included Ofloxacin , 10% betadine to eyelids, 5% betadine to  ocular surface. A 30 gauge needle was used.   Injection:  1.25 mg Bevacizumab (AVASTIN) SOLN   NDC: [address]-4, Lot: [id number]   Route: Intravitreal, Site: Right Eye, Waste: 0 mg  Post-op Post injection exam found visual acuity of at least counting fingers. The patient tolerated the procedure well. There were no complications. The patient received written and verbal post procedure care education.                 ASSESSMENT/PLAN:  Branch retinal vein occlusion with macular edema of right eye Recurrent CME from [facility], multiple occurrences at interval longer than 5 weeks.  Repeat injection Avastin today at 5-week interval and examination again in 5 weeks      ICD-10-CM   1. Branch retinal vein occlusion with macular edema of right eye  H34.8310 OCT, Retina - OU - Both Eyes    Intravitreal Injection, Pharmacologic Agent - OD - Right Eye    Bevacizumab (AVASTIN) SOLN 1.25 mg    1.  CME OD, much improved on examination 5 weeks post Avastin will continue to examine at 5-week intervals.  2.  3.  Ophthalmic Meds  Ordered this visit:  Meds ordered this encounter  Medications  . Bevacizumab (AVASTIN) SOLN 1.25 mg       Return in about 5 weeks (around 02/26/2020) for AVASTIN OCT, OD.  Patient Instructions  Patient asked to notify the office should new difficulties or visual changes develop in the ensuing interval    Explained the diagnoses, plan, and follow up with the patient and they expressed understanding.  Patient expressed understanding of the importance of proper follow up care.   Clent Demark Durelle Zepeda M.D. Diseases & Surgery of the Retina and Vitreous [facility] 01/22/20     Abbreviations: M myopia (nearsighted); A astigmatism; H hyperopia (farsighted); P presbyopia; Mrx spectacle prescription;  CTL contact lenses; OD right eye; OS left eye; OU both eyes  XT exotropia; ET esotropia; PEK punctate epithelial keratitis; PEE punctate epithelial erosions; DES  dry eye syndrome; MGD meibomian gland dysfunction; ATs artificial tears; PFAT's preservative free artificial tears; [place] nuclear sclerotic cataract; PSC posterior subcapsular cataract; ERM epi-retinal membrane; PVD posterior vitreous detachment; RD retinal detachment; DM diabetes mellitus; DR diabetic retinopathy; NPDR non-proliferative diabetic retinopathy; PDR proliferative diabetic retinopathy; CSME clinically significant macular edema; DME diabetic macular edema; dbh dot blot hemorrhages; CWS cotton wool spot; POAG primary open angle glaucoma; C/D cup-to-disc ratio; HVF humphrey visual field; GVF goldmann visual field; OCT optical coherence tomography; IOP intraocular pressure; BRVO Branch retinal vein occlusion; CRVO central retinal vein occlusion; CRAO central retinal artery occlusion; BRAO branch retinal artery occlusion; RT retinal tear; SB scleral buckle; PPV pars plana vitrectomy; VH Vitreous hemorrhage; PRP panretinal laser photocoagulation; IVK intravitreal kenalog; VMT vitreomacular traction; MH Macular hole;  NVD neovascularization of the disc; NVE neovascularization elsewhere; AREDS age related eye disease study; ARMD age related macular degeneration; POAG primary open angle glaucoma; EBMD epithelial/anterior basement membrane dystrophy; ACIOL anterior chamber intraocular lens; IOL intraocular lens; PCIOL posterior chamber intraocular lens; Phaco/IOL phacoemulsification with intraocular lens placement; [place] photorefractive keratectomy; LASIK laser assisted in situ keratomileusis; HTN hypertension; DM diabetes mellitus; COPD chronic obstructive pulmonary disease

## 2020-01-22 NOTE — Patient Instructions (Signed)
Patient asked to notify the office should new difficulties or visual changes develop in the ensuing interval

## 2020-01-22 NOTE — Assessment & Plan Note (Signed)
Recurrent CME from [facility], multiple occurrences at interval longer than 5 weeks.  Repeat injection Avastin today at 5-week interval and examination again in 5 weeks

## 2020-01-23 ENCOUNTER — Ambulatory Visit: Payer: PPO | Admitting: Student

## 2020-01-23 DIAGNOSIS — H4310 Vitreous hemorrhage, unspecified eye: Secondary | ICD-10-CM | POA: Diagnosis not present

## 2020-01-23 DIAGNOSIS — I25118 Atherosclerotic heart disease of native coronary artery with other forms of angina pectoris: Secondary | ICD-10-CM | POA: Diagnosis not present

## 2020-01-23 DIAGNOSIS — N183 Chronic kidney disease, stage 3 unspecified: Secondary | ICD-10-CM | POA: Diagnosis not present

## 2020-01-23 DIAGNOSIS — I130 Hypertensive heart and chronic kidney disease with heart failure and stage 1 through stage 4 chronic kidney disease, or unspecified chronic kidney disease: Secondary | ICD-10-CM | POA: Diagnosis not present

## 2020-01-23 DIAGNOSIS — I509 Heart failure, unspecified: Secondary | ICD-10-CM | POA: Diagnosis not present

## 2020-01-23 DIAGNOSIS — G4733 Obstructive sleep apnea (adult) (pediatric): Secondary | ICD-10-CM | POA: Diagnosis not present

## 2020-01-23 DIAGNOSIS — Z6828 Body mass index (BMI) 28.0-28.9, adult: Secondary | ICD-10-CM | POA: Diagnosis not present

## 2020-01-23 DIAGNOSIS — I4891 Unspecified atrial fibrillation: Secondary | ICD-10-CM | POA: Diagnosis not present

## 2020-01-23 DIAGNOSIS — Z9989 Dependence on other enabling machines and devices: Secondary | ICD-10-CM | POA: Diagnosis not present

## 2020-01-23 DIAGNOSIS — N2581 Secondary hyperparathyroidism of renal origin: Secondary | ICD-10-CM | POA: Diagnosis not present

## 2020-01-23 DIAGNOSIS — Z7901 Long term (current) use of anticoagulants: Secondary | ICD-10-CM | POA: Diagnosis not present

## 2020-01-23 DIAGNOSIS — E039 Hypothyroidism, unspecified: Secondary | ICD-10-CM | POA: Diagnosis not present

## 2020-01-24 ENCOUNTER — Ambulatory Visit: Payer: PPO | Admitting: Student

## 2020-01-24 ENCOUNTER — Other Ambulatory Visit: Payer: Self-pay

## 2020-01-24 ENCOUNTER — Encounter: Payer: Self-pay | Admitting: Student

## 2020-01-24 VITALS — BP 128/64 | HR 64 | Ht 63.0 in | Wt 160.4 lb

## 2020-01-24 DIAGNOSIS — I495 Sick sinus syndrome: Secondary | ICD-10-CM | POA: Diagnosis not present

## 2020-01-24 DIAGNOSIS — I480 Paroxysmal atrial fibrillation: Secondary | ICD-10-CM

## 2020-01-24 DIAGNOSIS — I10 Essential (primary) hypertension: Secondary | ICD-10-CM

## 2020-01-24 DIAGNOSIS — I2510 Atherosclerotic heart disease of native coronary artery without angina pectoris: Secondary | ICD-10-CM

## 2020-01-24 DIAGNOSIS — E785 Hyperlipidemia, unspecified: Secondary | ICD-10-CM | POA: Diagnosis not present

## 2020-01-24 DIAGNOSIS — I5032 Chronic diastolic (congestive) heart failure: Secondary | ICD-10-CM

## 2020-01-24 NOTE — Progress Notes (Signed)
Cardiology Office Note    Date:  01/24/2020   ID:  Virginia Huber, DOB 1946/04/28, MRN [id number]  PCP:  Sharilyn Sites, MD  Cardiologist: Kate Sable, MD (Inactive)   EP: Dr. Curt Bears  Chief Complaint  Patient presents with  . Follow-up    5 month visit    History of Present Illness:    Virginia Huber is a 74 y.o. female with past medical history of CAD (s/p PTCA of OM1 in 2016, DES to LCx in 2016, DES to RCA in 2017), chronic diastolic CHF, PAF (on Eliquis, s/p DCCV in 11/2017), tachy-brady syndrome (s/p Medtronic PPM placement in 2018), OSA (on CPAP), HTN, HLD, and IDDM who presents to the office today for 19-month follow-up.  She was last examined by myself in 08/2019 for follow-up from her recent emergency department visit during which she was found to have an acute CHF exacerbation. She had responded well to IV Lasix and was currently taking 60 mg in AM/40 mg in the PM. At the time of her visit, she reported her breathing had been stable and weight was at 152 lbs on her home scales. She was continued on her current medication regimen at that time and follow-up labs were arranged. This showed that her creatinine was stable at 1.39 which was similar to prior values as it had previously elevated to 1.7 in 06/2019. She did have a repeat BMET on 6/23 and creatinine was at 1.40.  In talking with the patient today, she reports doing well since her last visit and denies any recent dyspnea on exertion, exertional chest pain, orthopnea, PND or lower extremity edema. She is aware when she has an atrial fibrillation episode and experiences palpitations and discomfort with this. No recent symptoms. She reports good compliance with her CPAP at night.  She is being followed by Dr. Theador Hawthorne for her CKD and was recently started on Hydralazine. Reports her blood pressure has improved since this addition. She reports good compliance with Eliquis and denies any recent melena, hematochezia or  hematuria.  Past Medical History:  Diagnosis Date  . Arthritis   . Atrial fibrillation and flutter ([place])    a. h/o PAF/flutter during admission in 2013 for PNA. b. PAF during adm for NSTEMI 07/2015, subsequent paroxysms since then.  . B12 deficiency anemia   . Cardiac tamponade 06/2016  . CHF (congestive heart failure) ([place])   . Coronary artery disease 11/30/2014   a. remote MI. b. h/o PTCA with scoring balloon to OM1 11/2014. c. NSTEMI 03/2015 s/p DES to prox-mid Cx. d. NSTEMI 07/2015 s/p scoring balloon/PTCA/DES to dRCA with PAF during that admission  . Cutaneous lupus erythematosus   . GERD (gastroesophageal reflux disease)   . Heart block   . History of blood transfusion 1980's   2nd surgical procedures  . HTN (hypertension)   . Hypercholesteremia   . Hypothyroidism   . Myocardial infarction ([place]) 02/2012  . Ovarian tumor   . PAD (peripheral artery disease) ([place])    a. s/p LE angio 2015; followed by Dr. Fletcher Anon - managed medically.  . Pain with urination 05/08/2015  . Paroxysmal atrial flutter ([place])   . Pericardial effusion    a. 06/2016 after ppm - s/p pericardiocentesis.  . Superficial fungus infection of skin 06/29/2013  . Tachy-brady syndrome ([place])    a. s/p Medtronic PPM 06/2016, c/b lead perf/pericardial effusion.  [name] TIA (transient ischemic attack) 08/2001; ~ 2006  . Type II diabetes mellitus ([place])   .  UTI (urinary tract infection) 05/08/2013    Past Surgical History:  Procedure Laterality Date  . ABDOMINAL AORTAGRAM N/A 01/03/2014   Procedure: ABDOMINAL [name];  Surgeon: Wellington Hampshire, MD;  Location: [facility]ATH [facility];  Service: Cardiovascular;  Laterality: N/A;  . ABDOMINAL HYSTERECTOMY  1972   "partial"  . APPENDECTOMY  1970's  . CARDIAC CATHETERIZATION  2008   Tiny OM-2 with 90% narrowing. Med tx.  [name] CARDIAC CATHETERIZATION N/A 11/30/2014   Procedure: Left Heart Cath and Coronary Angiography;  Surgeon: Troy Sine, MD; LAD 20%, CFX 50%, OM1 95%, right PLB 30%, LV  normal   . CARDIAC CATHETERIZATION N/A 11/30/2014   Procedure: Coronary Balloon Angioplasty;  Surgeon: Troy Sine, MD;  Angiosculpt scoring balloon and PTCA to the OM1 reducing stenosis from 95% to less than 10%  . CARDIAC CATHETERIZATION N/A 04/03/2015   Procedure: Left Heart Cath and Coronary Angiography;  Surgeon: Jolaine Artist, MD; dLAD 50%, CFX 90%, OM1 100%, PLA 15%, LVEDP 13    . CARDIAC CATHETERIZATION N/A 04/03/2015   Procedure: Coronary Stent Intervention;  Surgeon: Sherren Mocha, MD; 3.0x18 mm Xience DES to the CFX    . CARDIAC CATHETERIZATION N/A 08/02/2015   Procedure: Left Heart Cath and Coronary Angiography;  Surgeon: Troy Sine, MD;  Location: [facility] LAB;  Service: Cardiovascular;  Laterality: N/A;  . CARDIAC CATHETERIZATION N/A 08/02/2015   Procedure: Coronary Stent Intervention;  Surgeon: Troy Sine, MD;  Location: [facility];  Service: Cardiovascular;  Laterality: N/A;  . CARDIAC CATHETERIZATION N/A 06/25/2016   Procedure: Pericardiocentesis;  Surgeon: Will Meredith Leeds, MD;  Location: [facility];  Service: Cardiovascular;  Laterality: N/A;  . cardiac stents    . CARDIOVERSION N/A 12/15/2017   Procedure: CARDIOVERSION;  Surgeon: Acie Fredrickson Wonda Cheng, MD;  Location[facility] ENDOS[facility];  Service: Cardiovascular;  Laterality: N/A;  . CHOLECYSTECTOMY OPEN  1990's  . COLONOSCOPY  2005   Dr. Laural Golden: pancolonic divericula, polyp, path unknown currently  . COLONOSCOPY  2012   Dr. Oneida Alar: Normal TI, scattered diverticula in entire colon, small internal hemorrhoids, normal colon biopsies. Colonoscopy in 5-10 years.   . COLOSTOMY  05/1979  . COLOSTOMY REVERSAL  11/1979  . EP IMPLANTABLE DEVICE N/A 06/25/2016   Procedure: Lead Revision/Repair;  Surgeon: Will Meredith Leeds, MD;  Location: [facility] CV LAB;  Service: Cardiovascular;  Laterality: N/A;  . EP IMPLANTABLE DEVICE N/A 06/25/2016   Procedure: Pacemaker Implant;  Surgeon: Will Meredith Leeds, MD;   Location: [facility] [facility];  Service: Cardiovascular;  Laterality: N/A;  . EXCISIONAL HEMORRHOIDECTOMY  1970's  . EYE SURGERY Left 2000   "branch vein occlusion"  . EYE SURGERY Left ~ 2001   "smoothed out wrinkle"  . LEFT OOPHORECTOMY  05/1979   nicked bowel, peritonitis, colostomy; colostomy reversed 1981   . LOWER EXTREMITY ANGIOGRAM N/A 01/03/2014   Procedure: LOWER EXTREMITY ANGIOGRAM;  Surgeon: Wellington Hampshire, MD;  Location: [facility] CATH LAB;  Service: Cardiovascular;  Laterality: N/A;  . Nuclear med stress test  10/2011   Small area of mild ischemia inferoapically.  [name] PARTIAL HYSTERECTOMY  1970's   left ovaries, then ovaries removed later due tumors   . RIGHT OOPHORECTOMY  1970's    Current Medications: Outpatient Medications Prior to Visit  Medication Sig Dispense Refill  . acetaminophen (TYLENOL) 325 MG tablet Take 650 mg by mouth every 6 (six) hours as needed for headache.    [name] amiodarone (PACERONE) 200 MG tablet  Take 0.5 tablets (100 mg total) by mouth daily. 45 tablet 1  . apixaban (ELIQUIS) 5 MG TABS tablet Take 1 tablet (5 mg total) by mouth 2 (two) times daily. 28 tablet 0  . atorvastatin (LIPITOR) 80 MG tablet TAKE ONE TABLET BY MOUTH EVERY DAY AT 6:00PM 90 tablet 3  . carvedilol (COREG) 25 MG tablet Take 1 tablet (25 mg total) by mouth 2 (two) times daily. 90 tablet 1  . Cholecalciferol (VITAMIN D3 PO) Take 2 tablets by mouth daily.    . clopidogrel (PLAVIX) 75 MG tablet TAKE ONE TABLET (75MG  TOTAL) BY MOUTH DAILY 90 tablet 3  . cyanocobalamin (,VITAMIN B-12,) 1000 MCG/ML injection Inject 1,000 mcg into the muscle every 30 (thirty) days. Last injection was around 08/02/19    . diltiazem (CARDIZEM CD) 360 MG 24 hr capsule Take 1 capsule (360 mg total) by mouth daily. 90 capsule 3  . diltiazem (TIAZAC) 360 MG 24 hr capsule Take 360 mg by mouth daily.    [name] FREESTYLE LITE test strip USE AS DIRECTED TWICEODAILY.    . furosemide (LASIX) 40 MG tablet TAKE ONE AND HALF TABLET (60MG   TOTAL) BY MOUTH IN THE MORNING AND ONE TABLET IN THE EVENING (40MG  TOTAL) 180 tablet 4  . hydrALAZINE (APRESOLINE) 25 MG tablet Take 50 mg by mouth in the morning and at bedtime.     . insulin NPH-regular Human (NOVOLIN 70/30) (70-30) 100 UNIT/ML injection Inject 40 Units into the skin See admin instructions. Inject 25 units before breakfast and 25 units before supper  - only if pre-meal blood glucose is above 90 mg/dL. (Patient taking differently: Inject 40 Units into the skin See admin instructions. Inject 30 units before breakfast and 30 units before supper  - only if pre-meal blood glucose is above 90 mg/dL.) 10 mL 11  . levothyroxine (SYNTHROID) 137 MCG tablet Take 137 mcg by mouth daily before breakfast.    . nitroGLYCERIN (NITROSTAT) 0.4 MG SL tablet Place 1 tablet (0.4 mg total) under the tongue every 5 (five) minutes as needed for chest pain. 25 tablet 0  . olmesartan (BENICAR) 40 MG tablet Take 40 mg by mouth daily.    . potassium chloride SA (KLOR-CON) 20 MEQ tablet TAKE ONE (1) TABLET BY MOUTH EVERY DAY 90 tablet 1  . rOPINIRole (REQUIP) 0.5 MG tablet Take 0.5 mg by mouth at bedtime.   0  . hydrALAZINE (APRESOLINE) 25 MG tablet     . Potassium Chloride ER 20 MEQ TBCR Take 1 tablet by mouth at bedtime.    . Vitamin D, Ergocalciferol, (DRISDOL) 1.25 MG (50000 UNIT) CAPS capsule Take 50,000 Units by mouth every 7 (seven) days. On tuesdays     No facility-administered medications prior to visit.     Allergies:   Penicillins and Percocet [oxycodone-acetaminophen]   Social History   Socioeconomic History  . Marital status: Married    Spouse name: Not on file  . Number of children: Not on file  . Years of education: Not on file  . Highest education level: Not on file  Occupational History  . Occupation: Retired    Fish farm manager: RETIRED    Comment: Research officer, political party  Tobacco Use  . Smoking status: Never Smoker  . Smokeless tobacco: Never Used  . Tobacco comment: Never smoked  Vaping  Use  . Vaping Use: Never used  Substance and Sexual Activity  . Alcohol use: No    Alcohol/week: 0.0 standard drinks  . Drug use: No  .  Sexual activity: Never    Birth control/protection: Surgical    Comment: hyst  Other Topics Concern  . Not on file  Social History Narrative  . Not on file   Social Determinants of Health   Financial Resource Strain:   . Difficulty of Paying Living Expenses:   Food Insecurity:   . Worried About Charity fundraiser in the Last Year:   . Arboriculturist in the Last Year:   Transportation Needs:   . Film/video editor (Medical):   [name] Lack of Transportation (Non-Medical):   Physical Activity:   . Days of Exercise per Week:   . Minutes of Exercise per Session:   Stress:   . Feeling of Stress :   Social Connections:   . Frequency of Communication with Friends and Family:   . Frequency of Social Gatherings with Friends and Family:   . Attends Religious Services:   . Active Member of Clubs or Organizations:   . Attends Archivist Meetings:   [name] Marital Status:      Family History:  The patient's family history includes Diabetes in her brother; Heart disease in her brother, brother, father, mother, and sister; Lupus in her daughter; Thyroid disease in her brother and brother.   Review of Systems:   Please see the history of present illness.     General:  No chills, fever, night sweats or weight changes.  Cardiovascular:  No chest pain, dyspnea on exertion, edema, orthopnea, paroxysmal nocturnal dyspnea. Positive for palpitations.  Dermatological: No rash, lesions/masses Respiratory: No cough, dyspnea Urologic: No hematuria, dysuria Abdominal:   No nausea, vomiting, diarrhea, bright red blood per rectum, melena, or hematemesis Neurologic:  No visual changes, wkns, changes in mental status. All other systems reviewed and are otherwise negative except as noted above.   Physical Exam:    VS:  BP 128/64 (BP Location: Left Arm,  Patient Position: Sitting, Cuff Size: Large)   Pulse 64   Ht 5\' 3"  (1.6 m)   Wt 160 lb 6.4 oz (72.8 kg)   SpO2 98% Comment: at rest  BMI 28.41 kg/m    General: Well developed, well nourished,female appearing in no acute distress. Head: Normocephalic, atraumatic. Neck: No carotid bruits. JVD not elevated.  Lungs: Respirations regular and unlabored, without wheezes or rales.  Heart: Regular rate and rhythm. No S3 or S4.  No murmur, no rubs, or gallops appreciated. Abdomen: Appears non-distended. No obvious abdominal masses. Msk:  Strength and tone appear normal for age. No obvious joint deformities or effusions. Extremities: No clubbing or cyanosis. Trace ankle edema bilaterally.  Distal pedal pulses are 2+ bilaterally. Neuro: Alert and oriented X 3. Moves all extremities spontaneously. No focal deficits noted. Psych:  Responds to questions appropriately with a normal affect. Skin: No rashes or lesions noted  Wt Readings from Last 3 Encounters:  01/24/20 160 lb 6.4 oz (72.8 kg)  08/30/19 153 lb (69.4 kg)  08/24/19 152 lb 9.6 oz (69.2 kg)        Studies/Labs Reviewed:   EKG:  EKG is not ordered today.    Recent Labs: 08/15/2019: TSH 0.424 08/24/2019: B Natriuretic Peptide 547.0 08/25/2019: ALT 18; Hemoglobin 9.8; Platelets 259 08/26/2019: Potassium 4.2; Sodium 140 12/04/2019: BUN 24; Creatinine 1.5   Lipid Panel    Component Value Date/Time   CHOL 166 08/05/2017 0807   TRIG 160 (H) 08/05/2017 0807   HDL 43 (L) 08/05/2017 0807   CHOLHDL 3.9 08/05/2017 5732  VLDL NOT CALC 04/14/2016 0910   LDLCALC 96 08/05/2017 0807    Additional studies/ records that were reviewed today include:   Echocardiogram: 08/2019 IMPRESSIONS    1. Left ventricular ejection fraction, by estimation, is 55 to 60%. The  left ventricle has normal function. The left ventricle has no regional  wall motion abnormalities. There is moderate concentric left ventricular  hypertrophy. Left ventricular    diastolic function could not be evaluated. Left ventricular diastolic  function could not be evaluated. Elevated left ventricular end-diastolic  pressure.  2. Right ventricular systolic function is normal. The right ventricular  size is normal. There is mildly elevated pulmonary artery systolic  pressure.  3. Left atrial size was severely dilated.  4. The mitral valve is normal in structure and function. Trivial mitral  valve regurgitation. No evidence of mitral stenosis.  5. The aortic valve is tricuspid. Aortic valve regurgitation is not  visualized. No aortic stenosis is present.  6. The inferior vena cava is normal in size with <50% respiratory  variability, suggesting right atrial pressure of 8 mmHg.   Assessment:    1. Coronary artery disease involving native coronary artery of native heart without angina pectoris   2. Chronic diastolic heart failure ([place])   3. PAF (paroxysmal atrial fibrillation) ([place])   4. Tachy-brady syndrome ([place])   5. Essential hypertension   6. Hyperlipidemia LDL goal <70      Plan:   In order of problems listed above:  1. CAD - She is s/p PTCA of OM1 in 2016 with DES to LCx in 2016 and DES to RCA in 2017. She denies any recent chest pain or dyspnea on exertion.  - Continue Plavix 75mg  daily, Coreg 25mg  BID and Atorvastatin 80mg  daily.  2. Chronic Diastolic CHF - She denies any recent orthopnea, PND or lower extremity edema. Appears euvolemic on examination. Continue Lasix 60mg  in AM/40mg  in PM. She is followed by Dr. Theador Hawthorne as well and creatinine was stable at 1.40 by recent labs in 11/2019.  3. Paroxysmal Atrial Fibrillation - She is symptomatic with her episodes and recent device interrogation showed 1 AF episode which lasted for 6 hours and rates were well-controlled. She remains on Amiodarone 100mg  daily, Coreg 25mg  BID and Cardizem CD 360mg  daily.  - She denies any evidence of active bleeding. Remains on Eliquis for anticoagulation.    4. Tachy-brady syndrome - She is s/p Medtronic PPM placement in 2018. Followed by Dr. Curt Bears.   5. HTN - BP is well-controlled at 128/64 during today's visit. Continue current medication regimen with Coreg, Cardizem CD, Hydralazine and Olmesartan.   6. HLD - LDL was at 71 in 11/2019. She remains on Atorvastatin 80mg  daily.    Medication Adjustments/Labs and Tests Ordered: Current medicines are reviewed at length with the patient today.  Concerns regarding medicines are outlined above.  Medication changes, Labs and Tests ordered today are listed in the Patient Instructions below. Patient Instructions  Medication Instructions:  Your physician recommends that you continue on your current medications as directed. Please refer to the Current Medication list given to you today.  *If you need a refill on your cardiac medications before your next appointment, please call your pharmacy*   Lab Work: NONE   If you have labs (blood work) drawn today and your tests are completely normal, you will receive your results only by: [name] MyChart Message (if you have MyChart) OR . A paper copy in the mail If you have any lab test  that is abnormal or we need to change your treatment, we will call you to review the results.   Testing/Procedures: NONE    Follow-Up: At [facility], you and your health needs are our priority.  As part of our continuing mission to provide you with exceptional heart care, we have created designated Provider Care Teams.  These Care Teams include your primary Cardiologist (physician) and Advanced Practice Providers (APPs -  Physician Assistants and Nurse Practitioners) who all work together to provide you with the care you need, when you need it.  We recommend signing up for the patient portal called "MyChart".  Sign up information is provided on this After Visit Summary.  MyChart is used to connect with patients for Virtual Visits (Telemedicine).  Patients are able to  view lab/test results, encounter notes, upcoming appointments, etc.  Non-urgent messages can be sent to your provider as well.   To learn more about what you can do with MyChart, go to [email or web address].    Your next appointment:   6 month(s)  The format for your next appointment:   In Person  Provider:   Rozann Lesches, MD   Other Instructions Thank you for choosing [facility]!       Signed, Erma Heritage, PA-C  01/24/2020 8:35 PM    [address] [place], [place] [address] Phone: [phone] Fax: [phone]

## 2020-01-24 NOTE — Patient Instructions (Signed)
Medication Instructions:  Your physician recommends that you continue on your current medications as directed. Please refer to the Current Medication list given to you today.  *If you need a refill on your cardiac medications before your next appointment, please call your pharmacy*   Lab Work: NONE   If you have labs (blood work) drawn today and your tests are completely normal, you will receive your results only by: . MyChart Message (if you have MyChart) OR . A paper copy in the mail If you have any lab test that is abnormal or we need to change your treatment, we will call you to review the results.   Testing/Procedures: NONE    Follow-Up: At [facility], you and your health needs are our priority.  As part of our continuing mission to provide you with exceptional heart care, we have created designated Provider Care Teams.  These Care Teams include your primary Cardiologist (physician) and Advanced Practice Providers (APPs -  Physician Assistants and Nurse Practitioners) who all work together to provide you with the care you need, when you need it.  We recommend signing up for the patient portal called "MyChart".  Sign up information is provided on this After Visit Summary.  MyChart is used to connect with patients for Virtual Visits (Telemedicine).  Patients are able to view lab/test results, encounter notes, upcoming appointments, etc.  Non-urgent messages can be sent to your provider as well.   To learn more about what you can do with MyChart, go to [email or web address].    Your next appointment:   6 month(s)  The format for your next appointment:   In Person  Provider:   Samuel McDowell, MD   Other Instructions Thank you for choosing [facility]!    

## 2020-02-12 ENCOUNTER — Encounter: Payer: Self-pay | Admitting: Neurology

## 2020-02-12 ENCOUNTER — Ambulatory Visit: Payer: PPO | Admitting: Neurology

## 2020-02-12 VITALS — BP 165/83 | HR 82 | Ht 63.0 in | Wt 161.0 lb

## 2020-02-12 DIAGNOSIS — G4733 Obstructive sleep apnea (adult) (pediatric): Secondary | ICD-10-CM | POA: Diagnosis not present

## 2020-02-12 DIAGNOSIS — H35041 Retinal micro-aneurysms, unspecified, right eye: Secondary | ICD-10-CM

## 2020-02-12 DIAGNOSIS — Z9989 Dependence on other enabling machines and devices: Secondary | ICD-10-CM

## 2020-02-12 DIAGNOSIS — T462X5A Adverse effect of other antidysrhythmic drugs, initial encounter: Secondary | ICD-10-CM

## 2020-02-12 DIAGNOSIS — G620 Drug-induced polyneuropathy: Secondary | ICD-10-CM | POA: Diagnosis not present

## 2020-02-12 DIAGNOSIS — I4891 Unspecified atrial fibrillation: Secondary | ICD-10-CM | POA: Diagnosis not present

## 2020-02-12 NOTE — Patient Instructions (Signed)
Sleep Apnea Sleep apnea affects breathing during sleep. It causes breathing to stop for a short time or to become shallow. It can also increase the risk of:  Heart attack.  Stroke.  Being very overweight (obese).  Diabetes.  Heart failure.  Irregular heartbeat. The goal of treatment is to help you breathe normally again. What are the causes? There are three kinds of sleep apnea:  Obstructive sleep apnea. This is caused by a blocked or collapsed airway.  Central sleep apnea. This happens when the brain does not send the right signals to the muscles that control breathing.  Mixed sleep apnea. This is a combination of obstructive and central sleep apnea. The most common cause of this condition is a collapsed or blocked airway. This can happen if:  Your throat muscles are too relaxed.  Your tongue and tonsils are too large.  You are overweight.  Your airway is too small. What increases the risk?  Being overweight.  Smoking.  Having a small airway.  Being older.  Being female.  Drinking alcohol.  Taking medicines to calm yourself (sedatives or tranquilizers).  Having family members with the condition. What are the signs or symptoms?  Trouble staying asleep.  Being sleepy or tired during the day.  Getting angry a lot.  Loud snoring.  Headaches in the morning.  Not being able to focus your mind (concentrate).  Forgetting things.  Less interest in sex.  Mood swings.  Personality changes.  Feelings of sadness (depression).  Waking up a lot during the night to pee (urinate).  Dry mouth.  Sore throat. How is this diagnosed?  Your medical history.  A physical exam.  A test that is done when you are sleeping (sleep study). The test is most often done in a sleep lab but may also be done at home. How is this treated?   Sleeping on your side.  Using a medicine to get rid of mucus in your nose (decongestant).  Avoiding the use of alcohol,  medicines to help you relax, or certain pain medicines (narcotics).  Losing weight, if needed.  Changing your diet.  Not smoking.  Using a machine to open your airway while you sleep, such as: ? An oral appliance. This is a mouthpiece that shifts your lower jaw forward. ? A CPAP device. This device blows air through a mask when you breathe out (exhale). ? An EPAP device. This has valves that you put in each nostril. ? A BPAP device. This device blows air through a mask when you breathe in (inhale) and breathe out.  Having surgery if other treatments do not work. It is important to get treatment for sleep apnea. Without treatment, it can lead to:  High blood pressure.  Coronary artery disease.  In men, not being able to have an erection (impotence).  Reduced thinking ability. Follow these instructions at home: Lifestyle  Make changes that your doctor recommends.  Eat a healthy diet.  Lose weight if needed.  Avoid alcohol, medicines to help you relax, and some pain medicines.  Do not use any products that contain nicotine or tobacco, such as cigarettes, e-cigarettes, and chewing tobacco. If you need help quitting, ask your doctor. General instructions  Take over-the-counter and prescription medicines only as told by your doctor.  If you were given a machine to use while you sleep, use it only as told by your doctor.  If you are having surgery, make sure to tell your doctor you have sleep apnea. You   may need to bring your device with you.  Keep all follow-up visits as told by your doctor. This is important. Contact a doctor if:  The machine that you were given to use during sleep bothers you or does not seem to be working.  You do not get better.  You get worse. Get help right away if:  Your chest hurts.  You have trouble breathing in enough air.  You have an uncomfortable feeling in your back, arms, or stomach.  You have trouble talking.  One side of your  body feels weak.  A part of your face is hanging down. These symptoms may be an emergency. Do not wait to see if the symptoms will go away. Get medical help right away. Call your local emergency services (911 in the [place] Do not drive yourself to the hospital. Summary  This condition affects breathing during sleep.  The most common cause is a collapsed or blocked airway.  The goal of treatment is to help you breathe normally while you sleep. This information is not intended to replace advice given to you by your health care provider. Make sure you discuss any questions you have with your health care provider. Document Revised: 03/25/2018 Document Reviewed: 02/01/2018 [employer] Patient Education  2020 [employer]  

## 2020-02-12 NOTE — Progress Notes (Signed)
SLEEP MEDICINE CLINIC   Provider:  Larey Seat, M D  Referring Provider: Sharilyn Sites, MD Primary Care Physician:  Sharilyn Sites, MD  Chief Complaint  Patient presents with  . Follow-up    pt alone, rm 10. presents for f/u. states no issues or concerns. DME [facility]    HPI:  Virginia Huber is a 74 y.o. female patient , seen in a RV after sleep study and for CPAP compliance. CC: Loud snoring, dependent odema, atrial fib, CHF, witnessed apnea, fatigue, TIA.  She reports her family is doing well, nobody has fallen sick during this pandemic. Her grandson got infected as an EMT, recovered well. Her short term memory has been slowly declining and she has trouble to fall asleep and stay asleep. Her sleep study was performed elsewhere on 04-12-2016. She has been doing exquisitely well with her CPAP use 97% compliance by time and by days with an average of 8 hours and 12 minutes her CPAP is set for 10 cm pressure with 3 cm EPR and is now 74 years old.  Her residual AHI is low at 1.4 apneas per hour and she has very minimal air leakage.  This means that her mask fits well and that her apnea is well controlled I would not have to do any adjustments to that.  Any new mask or supplies go through her DME [employer].  The patient also endorsed only 2 points on the Epworth Sleepiness Scale at 18 points on the fatigue severity scale, and 1 point out of 15 on the geriatric depression score.  She is fully vaccinated against Covid.  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  She was taken recently of Metformin for at the beginning of the year and since then had actually more trouble controlling her blood sugar and her blood pressure today.  The step to discontinue Metformin was meant to put protect her kidney function. She is finally seen by Virginia Shin, MD - endocrinologist.      The patient continues to be a highly compliant CPAP user she has used her machine 28  over the last 30 days ending on 05 February 2019.  93% compliance for time and days with an average user time of 7 hours 27 minutes, she usually sleeps 8 hours or uses the machine 8 hours at night.  The pressure is set at 10 cmH2O her machine is provided by [employer].  She has an expiratory pressure relief setting of 3 cm and uses she has been using a full facemask with very little air leakage the 95th percentile is 1.2 L/min and a residual AHI is 1.0.  However she reports difficulties with falling asleep, staying asleep and with the mask fit by now.  She feels that she would be ready for change maybe trying a nasal mask or nasal cradle. Sometimes she drools at night.  Trazodone- 25 mg , a half tablet, she can increase to a full if needed. I will order a nasal mask, such as the wisp.      She had been originally seen here upon a referral from Dr. Jamesetta So Koneswaran,05-13-2016 Virginia Huber was originally  referred by her cardiologist for a sleep study to the [facility] location, she completed the sleep study but then was never given the results, a phone call or a follow-up appointment. She stated that she had similar experiences with the physician before when she had suffered a TIA and her follow-up appointments were not  properly made and results not discussed. I was surprised to see a patient who just had a sleep study on 04/12/2016 30 days later in my office to follow-up on a study that I have not performed. Based on the report the patient has a body mass index of 27 kg/m55, is 74 years of age, and went to the sleep lab 04/12/2016. Her AHI was only 10 per hour which constitutes mild sleep apnea. In supine sleep her AHI was 14.3, the lowest oxygen saturation at night was 86% and the technologist noted loud snoring. She slept about half of the night in supine sleep position she reached REM sleep with an AHI of 29.3 per hour.  Between June 2016 and May 2017 this patient had been admitted 5  times to hospital with atrial fibrillation and related to diastolic output failure. As she is on high embolic risk by A. fib and the related congestive heart failure, she remains on Eloquis and Plavix. Given the constellation of mild apnea with underlying  CAD , MI 4 years ago, double pneumonia in 2015, atrial fibrillation and previous stroke-TIA, congestive heart failure and REM dependent apnea, she needs to have an urgent CPAP titration.   Sleep habits are as follows:Usual bedtime for the couple is between 12 and 1 AM, usually but neither of them has trouble to fall asleep and Virginia Huber reports that she rises usually at 8 or 9 AM. Since she is retired she has time to sleep in. She sleeps almost equal times in supine and lateral position, she sleeps on 2 pillows for support. The bedroom is described as core, quiet and dark. Her husband uses a CPAP machine, she has nocturia frequent up to 3 times at night, fragmenting her sleep. She feels that her sleep is not restorative and refreshing and that she is a light sleeper, thus  easily awoken. Her husband has noted her to snore very loudly as a Merchant navy officer had reported. She often feels she hasn't slept at all because she lacks the sleeps refreshing and restorative qualities. So when she wakes up in the morning she is not necessarily feeling ready to go. She wakes up frequently with a headache in the morning indicating that she may have hypoxemia or hypercapnia. She was not checked for hypercapnia in her recent sleep study.  Sleep medical history and family sleep history:  Non family history of OSA  Social history: married, patient is a nontobacco user, has never smoked. She does not drink alcohol. She is using only decaffeinated coffees and teas. No shift work history.   Interval history from the 13's of March 2018, The patient underwent a sleep study on 06/08/2016, designated CPAP titration following an outside baseline sleep study. The  total AHI was  reduced to only 2.2 / hr under CPAP titration , SpO2  saturation  at or below 88% was 0.0 minutes .  There were no periodic limb movements. We ordered a CPAP for this patient with the set pressure of 10 cm water, she had a rather poor sleep efficiency under CPAP of 67% but has improved at home dramatically.  She continues now to use CPAP at 10 cm water pressure with 3 cm EPR and has reached  100% compliance for 30 days, 97% compliance for over 4 hours of consecutive use - she was hospitalized for 3 days within this time to receive a pacemaker. Her average user time is 8 hours and 4 minutes at night and her residual AHI is 2.1 -  given her complicated cardiac medical history, I think is an excellent result. In addition she reports an Epworth sleepiness score of only 6 points fatigue severity score of only 21 points and the geriatric depression score endorsed at only 1 out of 15 points. She noticed much more daytime alertness, she wakes up not as frequently as before CPAP, she reports no morning headaches. Only once in a while but she still have a bathroom break. Altogether this has been a good result. She was also given a higher dose of diuretics and her ankle edema has markedly improved.    Virginia Huber is a 74 y.o. female , is seen here for a new problem on 12-08-2017 .  [name] has a difficult cardiac history, she had bradycardia with near syncope's as a pacemaker patient has been treated for atrial fibrillation which has paroxysmally occurred on and off and just recently had another bout of rapid ventricular response.  She is on hydralazine, potassium chloride, Plavix, fish oil ropinirole Lasix ipratropium bromide, levothyroxine, vitamin D Eliquis, diltiazem, Nitrostat, albuterol sulfate metformin, and was started on amiodarone.  Her main concern today is tremor unsteadiness frequent falls she also feels not quite in control of her limbs.  2 falls have led to a fracture of her hallux on the right foot  and to an ankle fracture or talus fracture left foot.  Her CPAP compliance is excellent, but this is a side line of today's visit.  CPAP compliance is 93%, 90% for time, average use of 7 hours 43 minutes, CPAP is set at 10 cmH2O with 3 cm expiratory pressure relief and a residual AHI of 2.6 there are very few minor air leaks.  There needs to be no adjustments made.  Last date of download was 12/07/2017.   Review of Systems: Out of a complete 14 system review, the patient complains of only the following symptoms, and all other reviewed systems are negative. Loud snoring, dependent odema, atrial fib, CHF, witnessed apnea, fatigue, TIA.    Fatigue severity score  21 from 37  , depression score 1 from  2/15 .  No flowsheet data found.  How likely are you to doze in the following situations: 0 = not likely, 1 = slight chance, 2 = moderate chance, 3 = high chance  Sitting and Reading? 1 Watching Television? 1 Sitting inactive in a public place (theater or meeting)? Lying down in the afternoon when circumstances permit? 1 Sitting and talking to someone? Sitting quietly after lunch without alcohol? 1 In a car, while stopped for a few minutes in traffic? As a passenger in a car for an hour without a break?  Total = 2/ 24      Social History   Socioeconomic History  . Marital status: Married    Spouse name: Not on file  . Number of children: Not on file  . Years of education: Not on file  . Highest education level: Not on file  Occupational History  . Occupation: Retired    Fish farm manager: RETIRED    Comment: Research officer, political party  Tobacco Use  . Smoking status: Never Smoker  . Smokeless tobacco: Never Used  . Tobacco comment: Never smoked  Vaping Use  . Vaping Use: Never used  Substance and Sexual Activity  . Alcohol use: No    Alcohol/week: 0.0 standard drinks  . Drug use: No  . Sexual activity: Never    Birth control/protection: Surgical    Comment: hyst  Other Topics Concern  .  Not on file  Social History Narrative  . Not on file   Social Determinants of Health   Financial Resource Strain:   . Difficulty of Paying Living Expenses: Not on file  Food Insecurity:   . Worried About Charity fundraiser in the Last Year: Not on file  . Ran Out of Food in the Last Year: Not on file  Transportation Needs:   . Lack of Transportation (Medical): Not on file  . Lack of Transportation (Non-Medical): Not on file  Physical Activity:   . Days of Exercise per Week: Not on file  . Minutes of Exercise per Session: Not on file  Stress:   . Feeling of Stress : Not on file  Social Connections:   . Frequency of Communication with Friends and Family: Not on file  . Frequency of Social Gatherings with Friends and Family: Not on file  . Attends Religious Services: Not on file  . Active Member of Clubs or Organizations: Not on file  . Attends Archivist Meetings: Not on file  . Marital Status: Not on file  Intimate Partner Violence:   . Fear of Current or Ex-Partner: Not on file  . Emotionally Abused: Not on file  . Physically Abused: Not on file  . Sexually Abused: Not on file    Family History  Problem Relation Age of Onset  . Heart disease Mother        deceased  . Heart disease Father        deceased, heart disease  . Diabetes Brother   . Heart disease Brother   . Thyroid disease Brother   . Heart disease Sister   . Heart disease Brother   . Thyroid disease Brother   . Lupus Daughter   . Colon cancer Neg Hx     Past Medical History:  Diagnosis Date  . Arthritis   . Atrial fibrillation and flutter ([place])    a. h/o PAF/flutter during admission in 2013 for PNA. b. PAF during adm for NSTEMI 07/2015, subsequent paroxysms since then.  . B12 deficiency anemia   . Cardiac tamponade 06/2016  . CHF (congestive heart failure) ([place])   . Coronary artery disease 11/30/2014   a. remote MI. b. h/o PTCA with scoring balloon to OM1 11/2014. c. NSTEMI 03/2015 s/p DES  to prox-mid Cx. d. NSTEMI 07/2015 s/p scoring balloon/PTCA/DES to dRCA with PAF during that admission  . Cutaneous lupus erythematosus   . GERD (gastroesophageal reflux disease)   . Heart block   . History of blood transfusion 1980's   2nd surgical procedures  . HTN (hypertension)   . Hypercholesteremia   . Hypothyroidism   . Myocardial infarction ([place]) 02/2012  . Ovarian tumor   . PAD (peripheral artery disease) ([place])    a. s/p LE angio 2015; followed by Dr. Fletcher Anon - managed medically.  . Pain with urination 05/08/2015  . Paroxysmal atrial flutter ([place])   . Pericardial effusion    a. 06/2016 after ppm - s/p pericardiocentesis.  . Superficial fungus infection of skin 06/29/2013  . Tachy-brady syndrome ([place])    a. s/p Medtronic PPM 06/2016, c/b lead perf/pericardial effusion.  [name] TIA (transient ischemic attack) 08/2001; ~ 2006  . Type II diabetes mellitus ([place])   . UTI (urinary tract infection) 05/08/2013    Past Surgical History:  Procedure Laterality Date  . ABDOMINAL AORTAGRAM N/A 01/03/2014   Procedure: ABDOMINAL [name];  Surgeon: Wellington Hampshire, MD;  Location: [place] CATH LAB;  Service: Cardiovascular;  Laterality: N/A;  . ABDOMINAL HYSTERECTOMY  1972   "partial"  . APPENDECTOMY  1970's  . CARDIAC CATHETERIZATION  2008   Tiny OM-2 with 90% narrowing. Med tx.  [name] CARDIAC CATHETERIZATION N/A 11/30/2014   Procedure: Left Heart Cath and Coronary Angiography;  Surgeon: Troy Sine, MD; LAD 20%, CFX 50%, OM1 95%, right PLB 30%, LV normal   . CARDIAC CATHETERIZATION N/A 11/30/2014   Procedure: Coronary Balloon Angioplasty;  Surgeon: Troy Sine, MD;  Angiosculpt scoring balloon and PTCA to the OM1 reducing stenosis from 95% to less than 10%  . CARDIAC CATHETERIZATION N/A 04/03/2015   Procedure: Left Heart Cath and Coronary Angiography;  Surgeon: Jolaine Artist, MD; dLAD 50%, CFX 90%, OM1 100%, PLA 15%, LVEDP 13    . CARDIAC CATHETERIZATION N/A 04/03/2015   Procedure: Coronary  Stent Intervention;  Surgeon: Sherren Mocha, MD; 3.0x18 mm Xience DES to the CFX    . CARDIAC CATHETERIZATION N/A 08/02/2015   Procedure: Left Heart Cath and Coronary Angiography;  Surgeon: Troy Sine, MD;  Location: [place] CV LAB;  Service: Cardiovascular;  Laterality: N/A;  . CARDIAC CATHETERIZATION N/A 08/02/2015   Procedure: Coronary Stent Intervention;  Surgeon: Troy Sine, MD;  Location: [place] CV LAB;  Service: Cardiovascular;  Laterality: N/A;  . CARDIAC CATHETERIZATION N/A 06/25/2016   Procedure: Pericardiocentesis;  Surgeon: Will Meredith Leeds, MD;  Location: [place] CV LAB;  Service: Cardiovascular;  Laterality: N/A;  . cardiac stents    . CARDIOVERSION N/A 12/15/2017   Procedure: CARDIOVERSION;  Surgeon: Acie Fredrickson Wonda Cheng, MD;  Location: [facility] ENDOSCOPY;  Service: Cardiovascular;  Laterality: N/A;  . CHOLECYSTECTOMY OPEN  1990's  . COLONOSCOPY  2005   Dr. Laural Golden: pancolonic divericula, polyp, path unknown currently  . COLONOSCOPY  2012   Dr. Oneida Alar: Normal TI, scattered diverticula in entire colon, small internal hemorrhoids, normal colon biopsies. Colonoscopy in 5-10 years.   . COLOSTOMY  05/1979  . COLOSTOMY REVERSAL  11/1979  . EP IMPLANTABLE DEVICE N/A 06/25/2016   Procedure: Lead Revision/Repair;  Surgeon: Will Meredith Leeds, MD;  Location: [place] CV LAB;  Service: Cardiovascular;  Laterality: N/A;  . EP IMPLANTABLE DEVICE N/A 06/25/2016   Procedure: Pacemaker Implant;  Surgeon: Will Meredith Leeds, MD;  Location: [place] CV LAB;  Service: Cardiovascular;  Laterality: N/A;  . EXCISIONAL HEMORRHOIDECTOMY  1970's  . EYE SURGERY Left 2000   "branch vein occlusion"  . EYE SURGERY Left ~ 2001   "smoothed out wrinkle"  . LEFT OOPHORECTOMY  05/1979   nicked bowel, peritonitis, colostomy; colostomy reversed 1981   . LOWER EXTREMITY ANGIOGRAM N/A 01/03/2014   Procedure: LOWER EXTREMITY ANGIOGRAM;  Surgeon: Wellington Hampshire, MD;  Location: [place] CATH LAB;  Service:  Cardiovascular;  Laterality: N/A;  . Nuclear med stress test  10/2011   Small area of mild ischemia inferoapically.  [name] PARTIAL HYSTERECTOMY  1970's   left ovaries, then ovaries removed later due tumors   . RIGHT OOPHORECTOMY  1970's    Current Outpatient Medications  Medication Sig Dispense Refill  . acetaminophen (TYLENOL) 325 MG tablet Take 650 mg by mouth every 6 (six) hours as needed for headache.    [name] amiodarone (PACERONE) 200 MG tablet Take 0.5 tablets (100 mg total) by mouth daily. 45 tablet 1  . apixaban (ELIQUIS) 5 MG TABS tablet Take 1 tablet (5 mg total) by mouth 2 (two) times daily. 28 tablet 0  . atorvastatin (LIPITOR) 80 MG tablet  TAKE ONE TABLET BY MOUTH EVERY DAY AT 6:00PM 90 tablet 3  . carvedilol (COREG) 25 MG tablet Take 1 tablet (25 mg total) by mouth 2 (two) times daily. 90 tablet 1  . Cholecalciferol (VITAMIN D3 PO) Take 2 tablets by mouth daily.    . clopidogrel (PLAVIX) 75 MG tablet TAKE ONE TABLET (75MG  TOTAL) BY MOUTH DAILY 90 tablet 3  . cyanocobalamin (,VITAMIN B-12,) 1000 MCG/ML injection Inject 1,000 mcg into the muscle every 30 (thirty) days. Last injection was around 08/02/19    . diltiazem (CARDIZEM CD) 360 MG 24 hr capsule Take 1 capsule (360 mg total) by mouth daily. 90 capsule 3  . FREESTYLE LITE test strip USE AS DIRECTED TWICEODAILY.    . furosemide (LASIX) 40 MG tablet TAKE ONE AND HALF TABLET (60MG  TOTAL) BY MOUTH IN THE MORNING AND ONE TABLET IN THE EVENING (40MG  TOTAL) 180 tablet 4  . hydrALAZINE (APRESOLINE) 25 MG tablet Take 50 mg by mouth 3 (three) times daily.     . insulin NPH-regular Human (NOVOLIN 70/30) (70-30) 100 UNIT/ML injection Inject 40 Units into the skin See admin instructions. Inject 25 units before breakfast and 25 units before supper  - only if pre-meal blood glucose is above 90 mg/dL. (Patient taking differently: Inject 40 Units into the skin See admin instructions. Inject 30 units before breakfast and 30 units before supper  - only if  pre-meal blood glucose is above 90 mg/dL.) 10 mL 11  . levothyroxine (SYNTHROID) 137 MCG tablet Take 137 mcg by mouth daily before breakfast.    . nitroGLYCERIN (NITROSTAT) 0.4 MG SL tablet Place 1 tablet (0.4 mg total) under the tongue every 5 (five) minutes as needed for chest pain. 25 tablet 0  . olmesartan (BENICAR) 40 MG tablet Take 40 mg by mouth daily.    . potassium chloride SA (KLOR-CON) 20 MEQ tablet TAKE ONE (1) TABLET BY MOUTH EVERY DAY 90 tablet 1  . rOPINIRole (REQUIP) 0.5 MG tablet Take 0.5 mg by mouth at bedtime.   0   No current facility-administered medications for this visit.      Vitals: BP (!) 165/83   Pulse 82   Ht 5\' 3"  (1.6 m)   Wt 161 lb (73 kg)   BMI 28.52 kg/m  Last Weight:  Wt Readings from Last 1 Encounters:  02/12/20 161 lb (73 kg)   XQJ:JHER mass index is 28.52 kg/m.     Last Height:   Ht Readings from Last 1 Encounters:  02/12/20 5\' 3"  (1.6 m)    Physical exam:  General: The patient is awake, alert and appears not in acute distress. The patient is well groomed. Head: Normocephalic, atraumatic. Neck is supple. Mallampati 3  neck circumference: 15.25 . Nasal airflow congested,  TMJ click not  evident . Retrognathia is not seen.  Cardiovascular:  Regular rate and rhythm, without  murmurs or carotid bruit, and without distended neck veins. Respiratory: Lungs are clear to auscultation. Skin:  Without evidence of edema, or rash Trunk: BMI is 26 kg/m2.   Neurologic exam : The patient is awake and alert, oriented to place and time.    Attention span & concentration ability appears normal.   Speech is fluent,  without  dysarthria, dysphonia or aphasia.  Mood and affect are appropriate. She reports some memory loss- she appears rather a bit anxious and can tell me what she had for breakfast, when her last appointment was and which medications her sister takes...  she has  a sister with dementia.   Cranial nerves: no loss of smell or taste.  Pupils  are equal and briskly reactive to light.  Facial sensation intact to fine touch. Facial motor strength is symmetric and tongue and uvula move midline. Shoulder shrug was symmetrical.   Motor exam: she has new onset rigor and cog-wheeling in both biceps and at the wrists. Her hand have a minor tremor, not at rest, she was able to walk unassisted and shows normal arm swing, no pill rolling tremor, not stooped.  . Right hand grip strength is stronger than left, bilateral equal strength, no cogwheeling and no rigor. Sensory:  Fine touch, pinprick and vibration were reduced below the knee level, loss of primary modalities at the lateral ankle level. Patient reports she has peripheral arterial peripheral vascular disease. (She is on amiodarone).   Coordination: Rapid alternating movements in the fingers/hands was normal. Finger-to-nose maneuver normal without evidence of ataxia, dysmetria or tremor. Gait and station: Patient walks without assistive device- she reports propulsive tendencies when bending- she fell into the dishwasher. Sudden loss of control, has fallen-  Strength within normal limits.  Stance is stable and normal.   Deep tendon reflexes: in the upper and lower extremities are attenuated.The patient was advised of the nature of the diagnosed disorder , the treatment options and risks for general a health and wellness arising from not treating the condition.  I spent more than 25  minutes of face to face time with the patient. Greater than 50% of time was spent in counseling and coordination of care. We have discussed the diagnosis and differential and I answered the patient's questions.    Assessment:    OSA treated at 10 cm water pressure CPAP with FFM , [employer] SIMPLUS in small size. She is doing well.    1) Patient has peripheral arterial disease, ankle edema,  atrial fibrillation, paroxysmal atrial fibrillation with high embolism risk, is on chronic anticoagulation, with a  history of 5 admissions in 12 months for atrial fibrillation related to congestive heart failure, in addition history of TIA and stroke, retinal vein occlusion, coronary artery disease and myocardial infarction.  We need to continue treatment of DM, HTN to protect er form recurrent strokes, same for OSA.   2)She has pseudo-parkinson tremor and rigor bilaterally- over both biceps and wrists. No associted romberg, no pill rolling tremor, no loss of facial mimik or rare blink reflex.  I believe this is a side effect of AMIODARONE and a recent reduction in dose has improved tremor dramatically.   3) CKD - improved after d/c of metformin. She had often diarrhea.    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  Stay on Ropinorol , for tremor and RLS.  Stay on CPAP, machine is only 75 years old.  Trazodone .   RV with me or Np in 12 month    Asencion Partridge Rayleigh Gillyard MD  02/12/2020   CC: Sharilyn Sites, [address] [place],  [place] [address]

## 2020-02-14 ENCOUNTER — Other Ambulatory Visit: Payer: Self-pay

## 2020-02-14 ENCOUNTER — Ambulatory Visit: Payer: PPO | Admitting: Endocrinology

## 2020-02-14 ENCOUNTER — Encounter: Payer: Self-pay | Admitting: Endocrinology

## 2020-02-14 VITALS — BP 120/74 | HR 69 | Ht 63.0 in | Wt 160.4 lb

## 2020-02-14 DIAGNOSIS — E1121 Type 2 diabetes mellitus with diabetic nephropathy: Secondary | ICD-10-CM | POA: Diagnosis not present

## 2020-02-14 DIAGNOSIS — E119 Type 2 diabetes mellitus without complications: Secondary | ICD-10-CM | POA: Diagnosis not present

## 2020-02-14 LAB — POCT GLYCOSYLATED HEMOGLOBIN (HGB A1C): Hemoglobin A1C: 8.7 % — AB (ref 4.0–5.6)

## 2020-02-14 MED ORDER — TRULICITY 0.75 MG/0.5ML ~~LOC~~ SOAJ: 0.7500 mg | SUBCUTANEOUS | 11 refills | Status: DC

## 2020-02-14 NOTE — Patient Instructions (Signed)
good diet and exercise significantly improve the control of your diabetes.  please let me know if you wish to be referred to a dietician.  high blood sugar is very risky to your health.  you should see an eye doctor and dentist every year.  It is very important to get all recommended vaccinations.  Controlling your blood pressure and cholesterol drastically reduces the damage diabetes does to your body.  Those who smoke should quit.  Please discuss these with your doctor.  check your blood sugar twice a day.  vary the time of day when you check, between before the 3 meals, and at bedtime.  also check if you have symptoms of your blood sugar being too high or too low.  please keep a record of the readings and bring it to your next appointment here (or you can bring the meter itself).  You can write it on any piece of paper.  please call us sooner if your blood sugar goes below 70, or if you have a lot of readings over 200. I have sent a prescription to your pharmacy, to add "Trulicity."  Please call if this is too expensive, and we'll instead increase the insulin to 50 units twice a day.  If not, Please continue the same insulin.   Please come back for a follow-up appointment in 2 months.

## 2020-02-14 NOTE — Progress Notes (Signed)
Subjective:    Patient ID: Virginia Huber, female    DOB: December 20, 1945, 74 y.o.   MRN: [id number]  HPI pt is referred by Dr [name], for diabetes.  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  Pt says she cannot afford brand name meds, but she is willing to try again.  She takes 40 units BID.  she brings a record of her cbg's which I have reviewed today.  cbg varies from 60-300, but it is rarely below 170.  There is no trend throughout the day, but it is lowest with exertion.   Past Medical History:  Diagnosis Date  . Arthritis   . Atrial fibrillation and flutter ([place])    a. h/o PAF/flutter during admission in 2013 for PNA. b. PAF during adm for NSTEMI 07/2015, subsequent paroxysms since then.  . B12 deficiency anemia   . Cardiac tamponade 06/2016  . CHF (congestive heart failure) ([place])   . Coronary artery disease 11/30/2014   a. remote MI. b. h/o PTCA with scoring balloon to OM1 11/2014. c. NSTEMI 03/2015 s/p DES to prox-mid Cx. d. NSTEMI 07/2015 s/p scoring balloon/PTCA/DES to dRCA with PAF during that admission  . Cutaneous lupus erythematosus   . GERD (gastroesophageal reflux disease)   . Heart block   . History of blood transfusion 1980's   2nd surgical procedures  . HTN (hypertension)   . Hypercholesteremia   . Hypothyroidism   . Myocardial infarction ([place]) 02/2012  . Ovarian tumor   . PAD (peripheral artery disease) ([place])    a. s/p LE angio 2015; followed by Dr. Fletcher Anon - managed medically.  . Pain with urination 05/08/2015  . Paroxysmal atrial flutter ([place])   . Pericardial effusion    a. 06/2016 after ppm - s/p pericardiocentesis.  . Superficial fungus infection of skin 06/29/2013  . Tachy-brady syndrome ([place])    a. s/p Medtronic PPM 06/2016, c/b lead perf/pericardial effusion.   [name] TIA (transient ischemic attack) 08/2001; ~ 2006  . Type II diabetes mellitus ([place])   . UTI (urinary tract infection) 05/08/2013    Past Surgical History:  Procedure Laterality Date  . ABDOMINAL AORTAGRAM N/A 01/03/2014   Procedure: ABDOMINAL [name];  Surgeon: Wellington Hampshire, MD;  Location: [facility] CATH LAB;  Service: Cardiovascular;  Laterality: N/A;  . ABDOMINAL HYSTERECTOMY  1972   "partial"  . APPENDECTOMY  1970's  . CARDIAC CATHETERIZATION  2008   Tiny OM-2 with 90% narrowing. Med tx.  [name] CARDIAC CATHETERIZATION N/A 11/30/2014   Procedure: Left Heart Cath and Coronary Angiography;  Surgeon: Troy Sine, MD; LAD 20%, CFX 50%, OM1 95%, right PLB 30%, LV normal   . CARDIAC CATHETERIZATION N/A 11/30/2014   Procedure: Coronary Balloon Angioplasty;  Surgeon: Troy Sine, MD;  Angiosculpt scoring balloon and PTCA to the OM1 reducing stenosis from 95% to less than 10%  . CARDIAC CATHETERIZATION N/A 04/03/2015   Procedure: Left Heart Cath and Coronary Angiography;  Surgeon: Jolaine Artist, MD; dLAD 50%, CFX 90%, OM1 100%, PLA 15%, LVEDP 13    . CARDIAC CATHETERIZATION N/A 04/03/2015   Procedure: Coronary Stent Intervention;  Surgeon: Sherren Mocha, MD; 3.0x18 mm Xience DES to the CFX    . CARDIAC CATHETERIZATION N/A 08/02/2015   Procedure: Left Heart  Cath and Coronary Angiography;  Surgeon: Troy Sine, MD;  Location: [facility] CV LAB;  Service: Cardiovascular;  Laterality: N/A;  . CARDIAC CATHETERIZATION N/A 08/02/2015   Procedure: Coronary Stent Intervention;  Surgeon: Troy Sine, MD;  Location: [facility] CV LAB;  Service: Cardiovascular;  Laterality: N/A;  . CARDIAC CATHETERIZATION N/A 06/25/2016   Procedure: Pericardiocentesis;  Surgeon: Will Meredith Leeds, MD;  Location: [facility] CV LAB;  Service: Cardiovascular;  Laterality: N/A;  . cardiac stents    . CARDIOVERSION N/A 12/15/2017   Procedure: CARDIOVERSION;  Surgeon: Acie Fredrickson Wonda Cheng, MD;  Location: [facility] ENDOSCOPY;   Service: Cardiovascular;  Laterality: N/A;  . CHOLECYSTECTOMY OPEN  1990's  . COLONOSCOPY  2005   Dr. Laural Golden: pancolonic divericula, polyp, path unknown currently  . COLONOSCOPY  2012   Dr. Oneida Alar: Normal TI, scattered diverticula in entire colon, small internal hemorrhoids, normal colon biopsies. Colonoscopy in 5-10 years.   . COLOSTOMY  05/1979  . COLOSTOMY REVERSAL  11/1979  . EP IMPLANTABLE DEVICE N/A 06/25/2016   Procedure: Lead Revision/Repair;  Surgeon: Will Meredith Leeds, MD;  Location: [place] CV LAB;  Service: Cardiovascular;  Laterality: N/A;  . EP IMPLANTABLE DEVICE N/A 06/25/2016   Procedure: Pacemaker Implant;  Surgeon: Will Meredith Leeds, MD;  Location: [facility] CV LAB;  Service: Cardiovascular;  Laterality: N/A;  . EXCISIONAL HEMORRHOIDECTOMY  1970's  . EYE SURGERY Left 2000   "branch vein occlusion"  . EYE SURGERY Left ~ 2001   "smoothed out wrinkle"  . LEFT OOPHORECTOMY  05/1979   nicked bowel, peritonitis, colostomy; colostomy reversed 1981   . LOWER EXTREMITY ANGIOGRAM N/A 01/03/2014   Procedure: LOWER EXTREMITY ANGIOGRAM;  Surgeon: Wellington Hampshire, MD;  Location: [facility] CATH LAB;  Service: Cardiovascular;  Laterality: N/A;  . Nuclear med stress test  10/2011   Small area of mild ischemia inferoapically.  [name] PARTIAL HYSTERECTOMY  1970's   left ovaries, then ovaries removed later due tumors   . RIGHT OOPHORECTOMY  1970's    Social History   Socioeconomic History  . Marital status: Married    Spouse name: Not on file  . Number of children: Not on file  . Years of education: Not on file  . Highest education level: Not on file  Occupational History  . Occupation: Retired    Fish farm manager: RETIRED    Comment: Research officer, political party  Tobacco Use  . Smoking status: Never Smoker  . Smokeless tobacco: Never Used  . Tobacco comment: Never smoked  Vaping Use  . Vaping Use: Never used  Substance and Sexual Activity  . Alcohol use: No    Alcohol/week: 0.0 standard drinks    . Drug use: No  . Sexual activity: Never    Birth control/protection: Surgical    Comment: hyst  Other Topics Concern  . Not on file  Social History Narrative  . Not on file   Social Determinants of Health   Financial Resource Strain:   . Difficulty of Paying Living Expenses: Not on file  Food Insecurity:   . Worried About Charity fundraiser in the Last Year: Not on file  . Ran Out of Food in the Last Year: Not on file  Transportation Needs:   . Lack of Transportation (Medical): Not on file  . Lack of Transportation (Non-Medical): Not on file  Physical Activity:   . Days of Exercise per Week: Not on file  . Minutes of Exercise per Session: Not on file  Stress:   .  Feeling of Stress : Not on file  Social Connections:   . Frequency of Communication with Friends and Family: Not on file  . Frequency of Social Gatherings with Friends and Family: Not on file  . Attends Religious Services: Not on file  . Active Member of Clubs or Organizations: Not on file  . Attends Archivist Meetings: Not on file  . Marital Status: Not on file  Intimate Partner Violence:   . Fear of Current or Ex-Partner: Not on file  . Emotionally Abused: Not on file  . Physically Abused: Not on file  . Sexually Abused: Not on file    Current Outpatient Medications on File Prior to Visit  Medication Sig Dispense Refill  . acetaminophen (TYLENOL) 325 MG tablet Take 650 mg by mouth every 6 (six) hours as needed for headache.    [name] amiodarone (PACERONE) 200 MG tablet Take 0.5 tablets (100 mg total) by mouth daily. 45 tablet 1  . apixaban (ELIQUIS) 5 MG TABS tablet Take 1 tablet (5 mg total) by mouth 2 (two) times daily. 28 tablet 0  . atorvastatin (LIPITOR) 80 MG tablet TAKE ONE TABLET BY MOUTH EVERY DAY AT 6:00PM 90 tablet 3  . carvedilol (COREG) 25 MG tablet Take 1 tablet (25 mg total) by mouth 2 (two) times daily. 90 tablet 1  . Cholecalciferol (VITAMIN D3 PO) Take 1 tablet by mouth daily.     .  clopidogrel (PLAVIX) 75 MG tablet TAKE ONE TABLET (75MG  TOTAL) BY MOUTH DAILY 90 tablet 3  . cyanocobalamin (,VITAMIN B-12,) 1000 MCG/ML injection Inject 1,000 mcg into the muscle every 30 (thirty) days. Last injection was around 08/02/19    . diltiazem (CARDIZEM CD) 360 MG 24 hr capsule Take 1 capsule (360 mg total) by mouth daily. 90 capsule 3  . FREESTYLE LITE test strip 1 each by Other route 2 (two) times daily.     . furosemide (LASIX) 40 MG tablet TAKE ONE AND HALF TABLET (60MG  TOTAL) BY MOUTH IN THE MORNING AND ONE TABLET IN THE EVENING (40MG  TOTAL) 180 tablet 4  . hydrALAZINE (APRESOLINE) 25 MG tablet Take 50 mg by mouth 3 (three) times daily.     . insulin NPH-regular Human (NOVOLIN 70/30) (70-30) 100 UNIT/ML injection Inject 40 Units into the skin See admin instructions. Inject 25 units before breakfast and 25 units before supper  - only if pre-meal blood glucose is above 90 mg/dL. (Patient taking differently: Inject 40 Units into the skin See admin instructions. Inject 30 units before breakfast and 30 units before supper  - only if pre-meal blood glucose is above 90 mg/dL.) 10 mL 11  . levothyroxine (SYNTHROID) 137 MCG tablet Take 137 mcg by mouth daily before breakfast.    . nitroGLYCERIN (NITROSTAT) 0.4 MG SL tablet Place 1 tablet (0.4 mg total) under the tongue every 5 (five) minutes as needed for chest pain. 25 tablet 0  . olmesartan (BENICAR) 40 MG tablet Take 40 mg by mouth daily.    . potassium chloride SA (KLOR-CON) 20 MEQ tablet TAKE ONE (1) TABLET BY MOUTH EVERY DAY 90 tablet 1  . rOPINIRole (REQUIP) 0.5 MG tablet Take 0.5 mg by mouth at bedtime.   0   No current facility-administered medications on file prior to visit.    Allergies  Allergen Reactions  . Penicillins Hives    Has patient had a PCN reaction causing immediate rash, facial/tongue/throat swelling, SOB or lightheadedness with hypotension: Yes Has patient had a PCN reaction  causing severe rash involving mucus  membranes or skin necrosis: No Has patient had a PCN reaction that required hospitalization No Has patient had a PCN reaction occurring within the last 10 years: No If all of the above answers are "NO", then may proceed with Cephalosporin use.  [name] Percocet [Oxycodone-Acetaminophen] Nausea And Vomiting    Family History  Problem Relation Age of Onset  . Heart disease Mother        deceased  . Heart disease Father        deceased, heart disease  . Diabetes Brother   . Heart disease Brother   . Thyroid disease Brother   . Heart disease Sister   . Heart disease Brother   . Thyroid disease Brother   . Lupus Daughter   . Colon cancer Neg Hx     BP 120/74   Pulse 69   Ht 5\' 3"  (1.6 m)   Wt 160 lb 6.4 oz (72.8 kg)   SpO2 98%   BMI 28.41 kg/m    Review of Systems denies chest pain, sob, n/v, memory loss, hypoglycemia, and depression.  She has regained weight.  She has chronically poor vision.       Objective:   Physical Exam VITAL SIGNS:  See vs page GENERAL: no distress Pulses: dorsalis pedis intact bilat.   MSK: no deformity of the feet CV: trace bilat leg edema Skin:  no ulcer on the feet.  normal color and temp on the feet.  Neuro: sensation is intact to touch on the feet, but decreased from normal.  Ext: there is bilateral onychomycosis of the toenails.   Lab Results  Component Value Date   HGBA1C 8.7 (A) 02/14/2020   Lab Results  Component Value Date   CREATININE 1.5 (A) 12/04/2019   BUN 24 (A) 12/04/2019   NA 140 08/26/2019   K 4.2 08/26/2019   CL 105 08/26/2019   CO2 26 08/26/2019   I have reviewed outside records, and summarized: Pt was noted to have elevated A1c, and referred here.  Other med probs were stable      Assessment & Plan:  Insulin-requiring type 2 DM, with CRI: uncontrolled.  Patient Instructions  good diet and exercise significantly improve the control of your diabetes.  please let me know if you wish to be referred to a dietician.   high blood sugar is very risky to your health.  you should see an eye doctor and dentist every year.  It is very important to get all recommended vaccinations.  Controlling your blood pressure and cholesterol drastically reduces the damage diabetes does to your body.  Those who smoke should quit.  Please discuss these with your doctor.  check your blood sugar twice a day.  vary the time of day when you check, between before the 3 meals, and at bedtime.  also check if you have symptoms of your blood sugar being too high or too low.  please keep a record of the readings and bring it to your next appointment here (or you can bring the meter itself).  You can write it on any piece of paper.  please call us sooner if your blood sugar goes below 70, or if you have a lot of readings over 200. I have sent a prescription to your pharmacy, to add "Trulicity."  Please call if this is too expensive, and we'll instead increase the insulin to 50 units twice a day.  If not, Please continue the same insulin.  Please come back for a follow-up appointment in 2 months.

## 2020-02-15 DIAGNOSIS — E1129 Type 2 diabetes mellitus with other diabetic kidney complication: Secondary | ICD-10-CM | POA: Diagnosis not present

## 2020-02-15 DIAGNOSIS — I129 Hypertensive chronic kidney disease with stage 1 through stage 4 chronic kidney disease, or unspecified chronic kidney disease: Secondary | ICD-10-CM | POA: Diagnosis not present

## 2020-02-15 DIAGNOSIS — I5032 Chronic diastolic (congestive) heart failure: Secondary | ICD-10-CM | POA: Diagnosis not present

## 2020-02-15 DIAGNOSIS — N189 Chronic kidney disease, unspecified: Secondary | ICD-10-CM | POA: Diagnosis not present

## 2020-02-15 DIAGNOSIS — E211 Secondary hyperparathyroidism, not elsewhere classified: Secondary | ICD-10-CM | POA: Diagnosis not present

## 2020-02-15 DIAGNOSIS — R809 Proteinuria, unspecified: Secondary | ICD-10-CM | POA: Diagnosis not present

## 2020-02-15 DIAGNOSIS — E1122 Type 2 diabetes mellitus with diabetic chronic kidney disease: Secondary | ICD-10-CM | POA: Diagnosis not present

## 2020-02-20 DIAGNOSIS — I5032 Chronic diastolic (congestive) heart failure: Secondary | ICD-10-CM | POA: Diagnosis not present

## 2020-02-20 DIAGNOSIS — I130 Hypertensive heart and chronic kidney disease with heart failure and stage 1 through stage 4 chronic kidney disease, or unspecified chronic kidney disease: Secondary | ICD-10-CM | POA: Diagnosis not present

## 2020-02-20 DIAGNOSIS — N184 Chronic kidney disease, stage 4 (severe): Secondary | ICD-10-CM | POA: Diagnosis not present

## 2020-02-20 DIAGNOSIS — E1122 Type 2 diabetes mellitus with diabetic chronic kidney disease: Secondary | ICD-10-CM | POA: Diagnosis not present

## 2020-02-22 ENCOUNTER — Encounter: Payer: Self-pay | Admitting: Cardiology

## 2020-02-22 ENCOUNTER — Ambulatory Visit: Payer: PPO | Admitting: Cardiology

## 2020-02-22 ENCOUNTER — Other Ambulatory Visit: Payer: Self-pay

## 2020-02-22 VITALS — BP 126/64 | HR 71 | Ht 63.0 in | Wt 164.4 lb

## 2020-02-22 DIAGNOSIS — Z79899 Other long term (current) drug therapy: Secondary | ICD-10-CM

## 2020-02-22 DIAGNOSIS — I495 Sick sinus syndrome: Secondary | ICD-10-CM | POA: Diagnosis not present

## 2020-02-22 DIAGNOSIS — I480 Paroxysmal atrial fibrillation: Secondary | ICD-10-CM

## 2020-02-22 LAB — CUP PACEART INCLINIC DEVICE CHECK
Battery Remaining Longevity: 74 mo
Battery Voltage: 3.01 V
Brady Statistic AP VP Percent: 0.08 %
Brady Statistic AP VS Percent: 96.69 %
Brady Statistic AS VP Percent: 0.26 %
Brady Statistic AS VS Percent: 2.98 %
Brady Statistic RA Percent Paced: 96.42 %
Brady Statistic RV Percent Paced: 0.34 %
Date Time Interrogation Session: 20210902163322
Implantable Lead Implant Date: 20180104
Implantable Lead Implant Date: 20180104
Implantable Lead Location: 753859
Implantable Lead Location: 753860
Implantable Lead Model: 5076
Implantable Lead Model: 5076
Implantable Pulse Generator Implant Date: 20180104
Lead Channel Impedance Value: 323 Ohm
Lead Channel Impedance Value: 380 Ohm
Lead Channel Impedance Value: 380 Ohm
Lead Channel Impedance Value: 475 Ohm
Lead Channel Pacing Threshold Amplitude: 0.75 V
Lead Channel Pacing Threshold Amplitude: 1.25 V
Lead Channel Pacing Threshold Pulse Width: 0.4 ms
Lead Channel Pacing Threshold Pulse Width: 0.4 ms
Lead Channel Sensing Intrinsic Amplitude: 1.25 mV
Lead Channel Sensing Intrinsic Amplitude: 12.75 mV
Lead Channel Setting Pacing Amplitude: 2 V
Lead Channel Setting Pacing Amplitude: 2.5 V
Lead Channel Setting Pacing Pulse Width: 0.4 ms
Lead Channel Setting Sensing Sensitivity: 2.8 mV

## 2020-02-22 NOTE — Patient Instructions (Signed)
Medication Instructions:  Your physician recommends that you continue on your current medications as directed. Please refer to the Current Medication list given to you today.  *If you need a refill on your cardiac medications before your next appointment, please call your pharmacy*   Lab Work: Amiodarone surveillance labs today: TSH & LFTs If you have labs (blood work) drawn today and your tests are completely normal, you will receive your results only by: [name] MyChart Message (if you have MyChart) OR . A paper copy in the mail If you have any lab test that is abnormal or we need to change your treatment, we will call you to review the results.   Testing/Procedures: None ordered   Follow-Up: Remote monitoring is used to monitor your Pacemaker of ICD from home. This monitoring reduces the number of office visits required to check your device to one time per year. It allows [place] to keep an eye on the functioning of your device to ensure it is working properly. You are scheduled for a device check from home on 04/09/2020. You may send your transmission at any time that day. If you have a wireless device, the transmission will be sent automatically. After your physician reviews your transmission, you will receive a postcard with your next transmission date.  At [facility], you and your health needs are our priority.  As part of our continuing mission to provide you with exceptional heart care, we have created designated Provider Care Teams.  These Care Teams include your primary Cardiologist (physician) and Advanced Practice Providers (APPs -  Physician Assistants and Nurse Practitioners) who all work together to provide you with the care you need, when you need it.  We recommend signing up for the patient portal called "MyChart".  Sign up information is provided on this After Visit Summary.  MyChart is used to connect with patients for Virtual Visits (Telemedicine).  Patients are able to view  lab/test results, encounter notes, upcoming appointments, etc.  Non-urgent messages can be sent to your provider as well.   To learn more about what you can do with MyChart, go to [email or web address].    Your next appointment:   6 month(s)  The format for your next appointment:   In Person  Provider:   Allegra Lai, MD   Thank you for choosing [facility]!!   Trinidad Curet, RN [phone]    Other Instructions   Cardiac Ablation Cardiac ablation is a procedure to disable (ablate) a small amount of heart tissue in very specific places. The heart has many electrical connections. Sometimes these connections are abnormal and can cause the heart to beat very fast or irregularly. Ablating some of the problem areas can improve the heart rhythm or return it to normal. Ablation may be done for people who:  Have Wolff-Parkinson-White syndrome.  Have fast heart rhythms (tachycardia).  Have taken medicines for an abnormal heart rhythm (arrhythmia) that were not effective or caused side effects.  Have a high-risk heartbeat that may be life-threatening. During the procedure, a small incision is made in the neck or the groin, and a long, thin, flexible tube (catheter) is inserted into the incision and moved to the heart. Small devices (electrodes) on the tip of the catheter will send out electrical currents. A type of X-ray (fluoroscopy) will be used to help guide the catheter and to provide images of the heart. Tell a health care provider about:  Any allergies you have.  All medicines you are taking, including  vitamins, herbs, eye drops, creams, and over-the-counter medicines.  Any problems you or family members have had with anesthetic medicines.  Any blood disorders you have.  Any surgeries you have had.  Any medical conditions you have, such as kidney failure.  Whether you are pregnant or may be pregnant. What are the risks? Generally, this is a safe procedure.  However, problems may occur, including:  Infection.  Bruising and bleeding at the catheter insertion site.  Bleeding into the chest, especially into the sac that surrounds the heart. This is a serious complication.  Stroke or blood clots.  Damage to other structures or organs.  Allergic reaction to medicines or dyes.  Need for a permanent pacemaker if the normal electrical system is damaged. A pacemaker is a small computer that sends electrical signals to the heart and helps your heart beat normally.  The procedure not being fully effective. This may not be recognized until months later. Repeat ablation procedures are sometimes required. What happens before the procedure?  Follow instructions from your health care provider about eating or drinking restrictions.  Ask your health care provider about: ? Changing or stopping your regular medicines. This is especially important if you are taking diabetes medicines or blood thinners. ? Taking medicines such as aspirin and ibuprofen. These medicines can thin your blood. Do not take these medicines before your procedure if your health care provider instructs you not to.  Plan to have someone take you home from the hospital or clinic.  If you will be going home right after the procedure, plan to have someone with you for 24 hours. What happens during the procedure?  To lower your risk of infection: ? Your health care team will wash or sanitize their hands. ? Your skin will be washed with soap. ? Hair may be removed from the incision area.  An IV tube will be inserted into one of your veins.  You will be given a medicine to help you relax (sedative).  The skin on your neck or groin will be numbed.  An incision will be made in your neck or your groin.  A needle will be inserted through the incision and into a large vein in your neck or groin.  A catheter will be inserted into the needle and moved to your heart.  Dye may be  injected through the catheter to help your surgeon see the area of the heart that needs treatment.  Electrical currents will be sent from the catheter to ablate heart tissue in desired areas. There are three types of energy that may be used to ablate heart tissue: ? Heat (radiofrequency energy). ? Laser energy. ? Extreme cold (cryoablation).  When the necessary tissue has been ablated, the catheter will be removed.  Pressure will be held on the catheter insertion area to prevent excessive bleeding.  A bandage (dressing) will be placed over the catheter insertion area. The procedure may vary among health care providers and hospitals. What happens after the procedure?  Your blood pressure, heart rate, breathing rate, and blood oxygen level will be monitored until the medicines you were given have worn off.  Your catheter insertion area will be monitored for bleeding. You will need to lie still for a few hours to ensure that you do not bleed from the catheter insertion area.  Do not drive for 24 hours or as long as directed by your health care provider. Summary  Cardiac ablation is a procedure to disable (ablate) a small amount  of heart tissue in very specific places. Ablating some of the problem areas can improve the heart rhythm or return it to normal.  During the procedure, electrical currents will be sent from the catheter to ablate heart tissue in desired areas. This information is not intended to replace advice given to you by your health care provider. Make sure you discuss any questions you have with your health care provider. Document Revised: 11/29/2017 Document Reviewed: 04/27/2016 Elsevier Patient Education  [place].

## 2020-02-22 NOTE — Progress Notes (Signed)
Electrophysiology Office Note   Date:  02/22/2020   ID:  Virginia, Huber May 27, 1946, MRN [id number]  PCP:  Sharilyn Sites, MD  Cardiologist:  Bronson Ing Primary Electrophysiologist:  Alexis Reber Virginia Leeds, MD    No chief complaint on file.    History of Present Illness: Virginia Huber is a 74 y.o. female who presents today for electrophysiology evaluation.   She has a history of CAD status post MI and PCI of the OM1, with an an STEMI 03/2015 status post DES to the circumflex, an STEMI 07/2015 with PCI to the RCA and paroxysmal atrial fibrillation during that admission. She is on Eliquis. Sleep study showed mild sleep apnea. He was in the hospital on 12/17 and was found to be in A. fib with RVR. She converted to sinus rhythm after arrival. He was readmitted to the hospital on 12/23 and was noted to be in atrial flutter with rapid rates at that time. She had junctional rhythm and near-syncope after conversion to sinus rhythm. She had a postconversion pause of 5.6 seconds. Pacemaker placed 06/25/16 with lead revision 06/26/16. Device implant was complicated by pericardial effusion and tamponade requiring emergent pericardiocentesis.  Today, denies symptoms of palpitations, chest pain, shortness of breath, orthopnea, PND, lower extremity edema, claudication, dizziness, presyncope, syncope, bleeding, or neurologic sequela. The patient is tolerating medications without difficulties.  She has noted no further episodes of atrial fibrillation.  She is without restriction.  She has no chest pain or shortness of breath.  She was recently in [place] for a cousin's birthday.  She did well with traveling.  Past Medical History:  Diagnosis Date  . Arthritis   . Atrial fibrillation and flutter ([place])    a. h/o PAF/flutter during admission in 2013 for PNA. b. PAF during adm for NSTEMI 07/2015, subsequent paroxysms since then.  . B12 deficiency anemia   . Cardiac tamponade 06/2016  . CHF (congestive heart  failure) ([place])   . Coronary artery disease 11/30/2014   a. remote MI. b. h/o PTCA with scoring balloon to OM1 11/2014. c. NSTEMI 03/2015 s/p DES to prox-mid Cx. d. NSTEMI 07/2015 s/p scoring balloon/PTCA/DES to dRCA with PAF during that admission  . Cutaneous lupus erythematosus   . GERD (gastroesophageal reflux disease)   . Heart block   . History of blood transfusion 1980's   2nd surgical procedures  . HTN (hypertension)   . Hypercholesteremia   . Hypothyroidism   . Myocardial infarction ([place]) 02/2012  . Ovarian tumor   . PAD (peripheral artery disease) ([place])    a. s/p LE angio 2015; followed by Dr. Fletcher Anon - managed medically.  . Pain with urination 05/08/2015  . Paroxysmal atrial flutter ([place])   . Pericardial effusion    a. 06/2016 after ppm - s/p pericardiocentesis.  . Superficial fungus infection of skin 06/29/2013  . Tachy-brady syndrome ([place])    a. s/p Medtronic PPM 06/2016, c/b lead perf/pericardial effusion.  [name] TIA (transient ischemic attack) 08/2001; ~ 2006  . Type II diabetes mellitus ([place])   . UTI (urinary tract infection) 05/08/2013   Past Surgical History:  Procedure Laterality Date  . ABDOMINAL AORTAGRAM N/A 01/03/2014   Procedure: ABDOMINAL [name];  Surgeon: Wellington Hampshire, MD;  Location: [facility] CATH LAB;  Service: Cardiovascular;  Laterality: N/A;  . ABDOMINAL HYSTERECTOMY  1972   "partial"  . APPENDECTOMY  1970's  . CARDIAC CATHETERIZATION  2008   Tiny OM-2 with 90% narrowing. Med tx.  [name] CARDIAC CATHETERIZATION N/A 11/30/2014  Procedure: Left Heart Cath and Coronary Angiography;  Surgeon: Troy Sine, MD; LAD 20%, CFX 50%, OM1 95%, right PLB 30%, LV normal   . CARDIAC CATHETERIZATION N/A 11/30/2014   Procedure: Coronary Balloon Angioplasty;  Surgeon: Troy Sine, MD;  Angiosculpt scoring balloon and PTCA to the OM1 reducing stenosis from 95% to less than 10%  . CARDIAC CATHETERIZATION N/A 04/03/2015   Procedure: Left Heart Cath and Coronary Angiography;  Surgeon:  Jolaine Artist, MD; dLAD 50%, CFX 90%, OM1 100%, PLA 15%, LVEDP 13    . CARDIAC CATHETERIZATION N/A 04/03/2015   Procedure: Coronary Stent Intervention;  Surgeon: Sherren Mocha, MD; 3.0x18 mm Xience DES to the CFX    . CARDIAC CATHETERIZATION N/A 08/02/2015   Procedure: Left Heart Cath and Coronary Angiography;  Surgeon: Troy Sine, MD;  Location: [facility] CV LAB;  Service: Cardiovascular;  Laterality: N/A;  . CARDIAC CATHETERIZATION N/A 08/02/2015   Procedure: Coronary Stent Intervention;  Surgeon: Troy Sine, MD;  Location: [facility] CV LAB;  Service: Cardiovascular;  Laterality: N/A;  . CARDIAC CATHETERIZATION N/A 06/25/2016   Procedure: Pericardiocentesis;  Surgeon: Bacilio Abascal Virginia Leeds, MD;  Location: [facility] LAB;  Service: Cardiovascular;  Laterality: N/A;  . cardiac stents    . CARDIOVERSION N/A 12/15/2017   Procedure: CARDIOVERSION;  Surgeon: Acie Fredrickson Wonda Cheng, MD;  Location: [facility] ENDOSCOPY;  Service: Cardiovascular;  Laterality: N/A;  . CHOLECYSTECTOMY OPEN  1990's  . COLONOSCOPY  2005   Dr. Laural Golden: pancolonic divericula, polyp, path unknown currently  . COLONOSCOPY  2012   Dr. Oneida Alar: Normal TI, scattered diverticula in entire colon, small internal hemorrhoids, normal colon biopsies. Colonoscopy in 5-10 years.   . COLOSTOMY  05/1979  . COLOSTOMY REVERSAL  11/1979  . EP IMPLANTABLE DEVICE N/A 06/25/2016   Procedure: Lead Revision/Repair;  Surgeon: Shimika Ames Virginia Leeds, MD;  Location: [facility] CV LAB;  Service: Cardiovascular;  Laterality: N/A;  . EP IMPLANTABLE DEVICE N/A 06/25/2016   Procedure: Pacemaker Implant;  Surgeon: Dlynn Ranes Virginia Leeds, MD;  Location: [facility] CV LAB;  Service: Cardiovascular;  Laterality: N/A;  . EXCISIONAL HEMORRHOIDECTOMY  1970's  . EYE SURGERY Left 2000   "branch vein occlusion"  . EYE SURGERY Left ~ 2001   "smoothed out wrinkle"  . LEFT OOPHORECTOMY  05/1979   nicked bowel, peritonitis, colostomy; colostomy reversed 1981   . LOWER  EXTREMITY ANGIOGRAM N/A 01/03/2014   Procedure: LOWER EXTREMITY ANGIOGRAM;  Surgeon: Wellington Hampshire, MD;  Location: [facility] CATH LAB;  Service: Cardiovascular;  Laterality: N/A;  . Nuclear med stress test  10/2011   Small area of mild ischemia inferoapically.  [name] PARTIAL HYSTERECTOMY  1970's   left ovaries, then ovaries removed later due tumors   . RIGHT OOPHORECTOMY  1970's     Current Outpatient Medications  Medication Sig Dispense Refill  . acetaminophen (TYLENOL) 325 MG tablet Take 650 mg by mouth every 6 (six) hours as needed for headache.    [name] amiodarone (PACERONE) 200 MG tablet Take 0.5 tablets (100 mg total) by mouth daily. 45 tablet 1  . apixaban (ELIQUIS) 5 MG TABS tablet Take 1 tablet (5 mg total) by mouth 2 (two) times daily. 28 tablet 0  . atorvastatin (LIPITOR) 80 MG tablet TAKE ONE TABLET BY MOUTH EVERY DAY AT 6:00PM 90 tablet 3  . carvedilol (COREG) 25 MG tablet Take 1 tablet (25 mg total) by mouth 2 (two) times daily. 90 tablet 1  . Cholecalciferol (VITAMIN D3 PO) Take  1 tablet by mouth daily.     . clopidogrel (PLAVIX) 75 MG tablet TAKE ONE TABLET (75MG  TOTAL) BY MOUTH DAILY 90 tablet 3  . cyanocobalamin (,VITAMIN B-12,) 1000 MCG/ML injection Inject 1,000 mcg into the muscle every 30 (thirty) days. Last injection was around 08/02/19    . diltiazem (CARDIZEM CD) 360 MG 24 hr capsule Take 1 capsule (360 mg total) by mouth daily. 90 capsule 3  . Dulaglutide (TRULICITY) 5.70 VX/7.9TJ SOPN Inject 0.5 mLs (0.75 mg total) into the skin once a week. 2 mL 11  . FREESTYLE LITE test strip 1 each by Other route 2 (two) times daily.     . furosemide (LASIX) 40 MG tablet TAKE ONE AND HALF TABLET (60MG  TOTAL) BY MOUTH IN THE MORNING AND ONE TABLET IN THE EVENING (40MG  TOTAL) 180 tablet 4  . hydrALAZINE (APRESOLINE) 50 MG tablet Take 50 mg by mouth 3 (three) times daily.    . insulin NPH-regular Human (NOVOLIN 70/30) (70-30) 100 UNIT/ML injection Inject 40 Units into the skin See admin  instructions. Inject 25 units before breakfast and 25 units before supper  - only if pre-meal blood glucose is above 90 mg/dL. (Patient taking differently: Inject 40 Units into the skin See admin instructions. Inject 30 units before breakfast and 30 units before supper  - only if pre-meal blood glucose is above 90 mg/dL.) 10 mL 11  . levothyroxine (SYNTHROID) 137 MCG tablet Take 137 mcg by mouth daily before breakfast.    . nitroGLYCERIN (NITROSTAT) 0.4 MG SL tablet Place 1 tablet (0.4 mg total) under the tongue every 5 (five) minutes as needed for chest pain. 25 tablet 0  . olmesartan (BENICAR) 40 MG tablet Take 40 mg by mouth daily.    . potassium chloride SA (KLOR-CON) 20 MEQ tablet TAKE ONE (1) TABLET BY MOUTH EVERY DAY 90 tablet 1  . rOPINIRole (REQUIP) 0.5 MG tablet Take 0.5 mg by mouth at bedtime.   0   No current facility-administered medications for this visit.    Allergies:   Penicillins and Percocet [oxycodone-acetaminophen]   Social History:  The patient  reports that she has never smoked. She has never used smokeless tobacco. She reports that she does not drink alcohol and does not use drugs.   Family History:  The patient's family history includes Diabetes in her brother; Heart disease in her brother, brother, father, mother, and sister; Lupus in her daughter; Thyroid disease in her brother and brother.   ROS:  Please see the history of present illness.   Otherwise, review of systems is positive for none.   All other systems are reviewed and negative.   PHYSICAL EXAM: VS:  BP 126/64   Pulse 71   Ht 5\' 3"  (1.6 m)   Wt 164 lb 6.4 oz (74.6 kg)   SpO2 97%   BMI 29.12 kg/m  , BMI Body mass index is 29.12 kg/m. GEN: Well nourished, well developed, in no acute distress  HEENT: normal  Neck: no JVD, carotid bruits, or masses Cardiac: RRR; no murmurs, rubs, or gallops,no edema  Respiratory:  clear to auscultation bilaterally, normal work of breathing GI: soft, nontender,  nondistended, + BS MS: no deformity or atrophy  Skin: warm and dry, device site well healed Neuro:  Strength and sensation are intact Psych: euthymic mood, full affect  EKG:  EKG is ordered today. Personal review of the ekg ordered shows atrial paced  Personal review of the device interrogation today. Results in [place]  Recent Labs: 08/15/2019: TSH 0.424 08/24/2019: B Natriuretic Peptide 547.0 08/25/2019: ALT 18; Hemoglobin 9.8; Platelets 259 08/26/2019: Potassium 4.2; Sodium 140 12/04/2019: BUN 24; Creatinine 1.5    Lipid Panel     Component Value Date/Time   CHOL 166 08/05/2017 0807   TRIG 160 (H) 08/05/2017 0807   HDL 43 (L) 08/05/2017 0807   CHOLHDL 3.9 08/05/2017 0807   VLDL NOT CALC 04/14/2016 0910   LDLCALC 96 08/05/2017 0807     Wt Readings from Last 3 Encounters:  02/22/20 164 lb 6.4 oz (74.6 kg)  02/14/20 160 lb 6.4 oz (72.8 kg)  02/12/20 161 lb (73 kg)      Other studies Reviewed: Additional studies/ records that were reviewed today include: TTE 08/04/17 Review of the above records today demonstrates:  - Left ventricle: The cavity size was normal. Wall thickness was   increased in a pattern of moderate LVH. Systolic function was   normal. The estimated ejection fraction was in the range of 55%   to 60%. Wall motion was normal; there were no regional wall   motion abnormalities. Features are consistent with a pseudonormal   left ventricular filling pattern, with concomitant abnormal   relaxation and increased filling pressure (grade 2 diastolic   dysfunction). Doppler parameters are consistent with high   ventricular filling pressure. - Left atrium: The atrium was severely dilated. - Right ventricle: Pacer wire or catheter noted in right ventricle. - Right atrium: Pacer wire or catheter noted in right atrium.  Cath  08/02/15  1st RPLB lesion, 50% stenosed.  Dist RCA lesion, 30% stenosed.  Ost 1st Mrg to 1st Mrg lesion, 100% stenosed.  Ost LAD lesion,  40% stenosed.  Mid LAD lesion, 40% stenosed.  Post Atrio lesion, 90% stenosed. Post intervention, there is a 0% residual stenosis.  The left ventricular systolic function is normal.  Mid RCA lesion, 20% stenosed.   Normal LV function without focal segmental wall motion abnormalities and ejection fraction of 55%.  Successful percutaneous cardiac intervention to the distal RCA treated with Angiosculpt scoring balloon, PTCA, and ultimate stenting with a 2.58 mm Xience Alpine DES stent postdilated to 2.51 mm with a 90% stenosis reduced to 0% and no change in the ostial PDA narrowing.  ASSESSMENT AND PLAN:  1.  Paroxysmal atrial fibrillation with tachybradycardia syndrome: Status post Medtronic dual-chamber pacemaker.  Device functioning appropriately.  Is currently on amiodarone and Eliquis with a CHA2DS2-VASc of 7.  I did speak with her about the possibility of ablation for her atrial fibrillation.  She would like to discuss this further with her family.  We Kieu Quiggle discuss it again at our next visit.  2.  Coronary artery disease: No current chest pain.  3.  Hypertension: Currently well controlled  Current medicines are reviewed at length with the patient today.   The patient does not have concerns regarding her medicines.  The none  Labs/ tests ordered today include:  Orders Placed This Encounter  Procedures  . TSH  . Hepatic function panel  . EKG 12-Lead     Disposition:   FU with Wolf Boulay 6 months  Signed, Lavaya Defreitas Virginia Leeds, MD  02/22/2020 2:55 PM     [facility] [address] [place] [address] [phone] (office) [phone] (fax)

## 2020-02-23 DIAGNOSIS — E1129 Type 2 diabetes mellitus with other diabetic kidney complication: Secondary | ICD-10-CM | POA: Diagnosis not present

## 2020-02-23 DIAGNOSIS — R809 Proteinuria, unspecified: Secondary | ICD-10-CM | POA: Diagnosis not present

## 2020-02-23 DIAGNOSIS — I129 Hypertensive chronic kidney disease with stage 1 through stage 4 chronic kidney disease, or unspecified chronic kidney disease: Secondary | ICD-10-CM | POA: Diagnosis not present

## 2020-02-23 DIAGNOSIS — E1122 Type 2 diabetes mellitus with diabetic chronic kidney disease: Secondary | ICD-10-CM | POA: Diagnosis not present

## 2020-02-23 DIAGNOSIS — I5032 Chronic diastolic (congestive) heart failure: Secondary | ICD-10-CM | POA: Diagnosis not present

## 2020-02-23 DIAGNOSIS — N189 Chronic kidney disease, unspecified: Secondary | ICD-10-CM | POA: Diagnosis not present

## 2020-02-23 LAB — HEPATIC FUNCTION PANEL
ALT: 14 IU/L (ref 0–32)
AST: 12 IU/L (ref 0–40)
Albumin: 4.4 g/dL (ref 3.7–4.7)
Alkaline Phosphatase: 159 IU/L — ABNORMAL HIGH (ref 48–121)
Bilirubin Total: 0.3 mg/dL (ref 0.0–1.2)
Bilirubin, Direct: 0.13 mg/dL (ref 0.00–0.40)
Total Protein: 6.4 g/dL (ref 6.0–8.5)

## 2020-02-23 LAB — TSH: TSH: 2.41 u[IU]/mL (ref 0.450–4.500)

## 2020-02-27 ENCOUNTER — Other Ambulatory Visit: Payer: Self-pay

## 2020-02-27 ENCOUNTER — Ambulatory Visit (INDEPENDENT_AMBULATORY_CARE_PROVIDER_SITE_OTHER): Payer: PPO | Admitting: Ophthalmology

## 2020-02-27 ENCOUNTER — Encounter (INDEPENDENT_AMBULATORY_CARE_PROVIDER_SITE_OTHER): Payer: Self-pay | Admitting: Ophthalmology

## 2020-02-27 DIAGNOSIS — H34831 Tributary (branch) retinal vein occlusion, right eye, with macular edema: Secondary | ICD-10-CM

## 2020-02-27 DIAGNOSIS — H353111 Nonexudative age-related macular degeneration, right eye, early dry stage: Secondary | ICD-10-CM

## 2020-02-27 DIAGNOSIS — H353131 Nonexudative age-related macular degeneration, bilateral, early dry stage: Secondary | ICD-10-CM

## 2020-02-27 HISTORY — DX: Nonexudative age-related macular degeneration, right eye, early dry stage: H35.3111

## 2020-02-27 MED ORDER — BEVACIZUMAB CHEMO INJECTION 1.25MG/0.05ML SYRINGE FOR KALEIDOSCOPE
1.2500 mg | INTRAVITREAL | Status: AC | PRN
  Administered 2020-02-27: 1.25 mg via INTRAVITREAL

## 2020-02-27 NOTE — Progress Notes (Signed)
02/27/2020     CHIEF COMPLAINT Patient presents for Retina Follow Up   HISTORY OF PRESENT ILLNESS: Virginia Huber is a 74 y.o. female who presents to the clinic today for:   HPI    Retina Follow Up    Patient presents with  CRVO/BRVO.  In right eye.  This started 5 weeks ago.  Severity is mild.  Duration of 5 weeks.  Since onset it is stable.          Comments    5 Week BRVO F/U OD, poss Avastin OD  Pt denies noticeable changes to [place] OU since last visit. Pt denies ocular pain, flashes of light, or floaters OU.         Last edited by Rockie Neighbours, [place] on 02/27/2020  8:49 AM. (History)      Referring physician: Sharilyn Sites, MD [address] [place],  [place] [address]  HISTORICAL INFORMATION:   Selected notes from the MEDICAL RECORD NUMBER    Lab Results  Component Value Date   HGBA1C 8.7 (A) 02/14/2020     CURRENT MEDICATIONS: No current outpatient medications on file. (Ophthalmic Drugs)   No current facility-administered medications for this visit. (Ophthalmic Drugs)   Current Outpatient Medications (Other)  Medication Sig  . acetaminophen (TYLENOL) 325 MG tablet Take 650 mg by mouth every 6 (six) hours as needed for headache.  [name] amiodarone (PACERONE) 200 MG tablet Take 0.5 tablets (100 mg total) by mouth daily.  [name] apixaban (ELIQUIS) 5 MG TABS tablet Take 1 tablet (5 mg total) by mouth 2 (two) times daily.  [name] atorvastatin (LIPITOR) 80 MG tablet TAKE ONE TABLET BY MOUTH EVERY DAY AT 6:00PM  . carvedilol (COREG) 25 MG tablet Take 1 tablet (25 mg total) by mouth 2 (two) times daily.  . Cholecalciferol (VITAMIN D3 PO) Take 1 tablet by mouth daily.   . clopidogrel (PLAVIX) 75 MG tablet TAKE ONE TABLET (75MG  TOTAL) BY MOUTH DAILY  . cyanocobalamin (,VITAMIN B-12,) 1000 MCG/ML injection Inject 1,000 mcg into the muscle every 30 (thirty) days. Last injection was around 08/02/19  . diltiazem (CARDIZEM CD) 360 MG 24 hr capsule Take 1 capsule (360 mg total) by mouth  daily.  . Dulaglutide (TRULICITY) 5.85 ID/7.8EU SOPN Inject 0.5 mLs (0.75 mg total) into the skin once a week.  [name] FREESTYLE LITE test strip 1 each by Other route 2 (two) times daily.   . furosemide (LASIX) 40 MG tablet TAKE ONE AND HALF TABLET (60MG  TOTAL) BY MOUTH IN THE MORNING AND ONE TABLET IN THE EVENING (40MG  TOTAL)  . hydrALAZINE (APRESOLINE) 50 MG tablet Take 50 mg by mouth 3 (three) times daily.  . insulin NPH-regular Human (NOVOLIN 70/30) (70-30) 100 UNIT/ML injection Inject 40 Units into the skin See admin instructions. Inject 25 units before breakfast and 25 units before supper  - only if pre-meal blood glucose is above 90 mg/dL. (Patient taking differently: Inject 40 Units into the skin See admin instructions. Inject 30 units before breakfast and 30 units before supper  - only if pre-meal blood glucose is above 90 mg/dL.)  . levothyroxine (SYNTHROID) 137 MCG tablet Take 137 mcg by mouth daily before breakfast.  . nitroGLYCERIN (NITROSTAT) 0.4 MG SL tablet Place 1 tablet (0.4 mg total) under the tongue every 5 (five) minutes as needed for chest pain.  [name] olmesartan (BENICAR) 40 MG tablet Take 40 mg by mouth daily.  . potassium chloride SA (KLOR-CON) 20 MEQ tablet TAKE ONE (1) TABLET BY  MOUTH EVERY DAY  . rOPINIRole (REQUIP) 0.5 MG tablet Take 0.5 mg by mouth at bedtime.    No current facility-administered medications for this visit. (Other)      REVIEW OF SYSTEMS:    ALLERGIES Allergies  Allergen Reactions  . Penicillins Hives    Has patient had a PCN reaction causing immediate rash, facial/tongue/throat swelling, SOB or lightheadedness with hypotension: Yes Has patient had a PCN reaction causing severe rash involving mucus membranes or skin necrosis: No Has patient had a PCN reaction that required hospitalization No Has patient had a PCN reaction occurring within the last 10 years: No If all of the above answers are "NO", then may proceed with Cephalosporin use.  [name] Percocet  [Oxycodone-Acetaminophen] Nausea And Vomiting    PAST MEDICAL HISTORY Past Medical History:  Diagnosis Date  . Arthritis   . Atrial fibrillation and flutter ([place])    a. h/o PAF/flutter during admission in 2013 for PNA. b. PAF during adm for NSTEMI 07/2015, subsequent paroxysms since then.  . B12 deficiency anemia   . Cardiac tamponade 06/2016  . CHF (congestive heart failure) ([place])   . Coronary artery disease 11/30/2014   a. remote MI. b. h/o PTCA with scoring balloon to OM1 11/2014. c. NSTEMI 03/2015 s/p DES to prox-mid Cx. d. NSTEMI 07/2015 s/p scoring balloon/PTCA/DES to dRCA with PAF during that admission  . Cutaneous lupus erythematosus   . GERD (gastroesophageal reflux disease)   . Heart block   . History of blood transfusion 1980's   2nd surgical procedures  . HTN (hypertension)   . Hypercholesteremia   . Hypothyroidism   . Myocardial infarction ([place]) 02/2012  . Ovarian tumor   . PAD (peripheral artery disease) ([place])    a. s/p LE angio 2015; followed by Dr. Fletcher Anon - managed medically.  . Pain with urination 05/08/2015  . Paroxysmal atrial flutter ([place])   . Pericardial effusion    a. 06/2016 after ppm - s/p pericardiocentesis.  . Superficial fungus infection of skin 06/29/2013  . Tachy-brady syndrome ([place])    a. s/p Medtronic PPM 06/2016, c/b lead perf/pericardial effusion.  [name] TIA (transient ischemic attack) 08/2001; ~ 2006  . Type II diabetes mellitus ([place])   . UTI (urinary tract infection) 05/08/2013   Past Surgical History:  Procedure Laterality Date  . ABDOMINAL AORTAGRAM N/A 01/03/2014   Procedure: ABDOMINAL [name];  Surgeon: Wellington Hampshire, MD;  Location: [facility] CATH LAB;  Service: Cardiovascular;  Laterality: N/A;  . ABDOMINAL HYSTERECTOMY  1972   "partial"  . APPENDECTOMY  1970's  . CARDIAC CATHETERIZATION  2008   Tiny OM-2 with 90% narrowing. Med tx.  [name] CARDIAC CATHETERIZATION N/A 11/30/2014   Procedure: Left Heart Cath and Coronary Angiography;  Surgeon: Troy Sine, MD; LAD 20%, CFX 50%, OM1 95%, right PLB 30%, LV normal   . CARDIAC CATHETERIZATION N/A 11/30/2014   Procedure: Coronary Balloon Angioplasty;  Surgeon: Troy Sine, MD;  Angiosculpt scoring balloon and PTCA to the OM1 reducing stenosis from 95% to less than 10%  . CARDIAC CATHETERIZATION N/A 04/03/2015   Procedure: Left Heart Cath and Coronary Angiography;  Surgeon: Jolaine Artist, MD; dLAD 50%, CFX 90%, OM1 100%, PLA 15%, LVEDP 13    . CARDIAC CATHETERIZATION N/A 04/03/2015   Procedure: Coronary Stent Intervention;  Surgeon: Sherren Mocha, MD; 3.0x18 mm Xience DES to the CFX    . CARDIAC CATHETERIZATION N/A 08/02/2015   Procedure: Left Heart Cath and Coronary Angiography;  Surgeon: Joyice Faster  Claiborne Billings, MD;  Location: [facility] CV LAB;  Service: Cardiovascular;  Laterality: N/A;  . CARDIAC CATHETERIZATION N/A 08/02/2015   Procedure: Coronary Stent Intervention;  Surgeon: Troy Sine, MD;  Location: [facility] CV LAB;  Service: Cardiovascular;  Laterality: N/A;  . CARDIAC CATHETERIZATION N/A 06/25/2016   Procedure: Pericardiocentesis;  Surgeon: Will Meredith Leeds, MD;  Location: [facility] LAB;  Service: Cardiovascular;  Laterality: N/A;  . cardiac stents    . CARDIOVERSION N/A 12/15/2017   Procedure: CARDIOVERSION;  Surgeon: Acie Fredrickson Wonda Cheng, MD;  Location: [facility] ENDOSCOPY;  Service: Cardiovascular;  Laterality: N/A;  . CHOLECYSTECTOMY OPEN  1990's  . COLONOSCOPY  2005   Dr. Laural Golden: pancolonic divericula, polyp, path unknown currently  . COLONOSCOPY  2012   Dr. Oneida Alar: Normal TI, scattered diverticula in entire colon, small internal hemorrhoids, normal colon biopsies. Colonoscopy in 5-10 years.   . COLOSTOMY  05/1979  . COLOSTOMY REVERSAL  11/1979  . EP IMPLANTABLE DEVICE N/A 06/25/2016   Procedure: Lead Revision/Repair;  Surgeon: Will Meredith Leeds, MD;  Location: [facility] CV LAB;  Service: Cardiovascular;  Laterality: N/A;  . EP IMPLANTABLE DEVICE N/A 06/25/2016   Procedure:  Pacemaker Implant;  Surgeon: Will Meredith Leeds, MD;  Location: [facility] CV LAB;  Service: Cardiovascular;  Laterality: N/A;  . EXCISIONAL HEMORRHOIDECTOMY  1970's  . EYE SURGERY Left 2000   "branch vein occlusion"  . EYE SURGERY Left ~ 2001   "smoothed out wrinkle"  . LEFT OOPHORECTOMY  05/1979   nicked bowel, peritonitis, colostomy; colostomy reversed 1981   . LOWER EXTREMITY ANGIOGRAM N/A 01/03/2014   Procedure: LOWER EXTREMITY ANGIOGRAM;  Surgeon: Wellington Hampshire, MD;  Location: [facility] CATH LAB;  Service: Cardiovascular;  Laterality: N/A;  . Nuclear med stress test  10/2011   Small area of mild ischemia inferoapically.  [name] PARTIAL HYSTERECTOMY  1970's   left ovaries, then ovaries removed later due tumors   . RIGHT OOPHORECTOMY  1970's    FAMILY HISTORY Family History  Problem Relation Age of Onset  . Heart disease Mother        deceased  . Heart disease Father        deceased, heart disease  . Diabetes Brother   . Heart disease Brother   . Thyroid disease Brother   . Heart disease Sister   . Heart disease Brother   . Thyroid disease Brother   . Lupus Daughter   . Colon cancer Neg Hx     SOCIAL HISTORY Social History   Tobacco Use  . Smoking status: Never Smoker  . Smokeless tobacco: Never Used  . Tobacco comment: Never smoked  Vaping Use  . Vaping Use: Never used  Substance Use Topics  . Alcohol use: No    Alcohol/week: 0.0 standard drinks  . Drug use: No         OPHTHALMIC EXAM:  Base Eye Exam    Visual Acuity (ETDRS)      Right Left   Dist cc 20/40 CF @ 1'   Dist ph cc NI NI   Correction: Glasses       Tonometry (Tonopen, 8:50 AM)      Right Left   Pressure 14 08       Pupils      Dark Light Shape React APD   Right 5 4 Round Brisk None   Left 5 5 Round Minimal +1       Visual Fields (Counting fingers)  Left Right     Full   Restrictions Total inferior temporal, inferior nasal deficiencies        Extraocular Movement      Right  Left    Full Full       Neuro/Psych    Oriented x3: Yes   Mood/Affect: Normal       Dilation    Right eye: 1.0% Mydriacyl, 2.5% Phenylephrine @ 8:52 AM        Slit Lamp and Fundus Exam    External Exam      Right Left   External Normal Normal       Slit Lamp Exam      Right Left   Lids/Lashes Normal Normal   Conjunctiva/Sclera White and quiet White and quiet   Cornea Clear Clear   Anterior Chamber Deep and quiet Deep and quiet   Iris Round and reactive Round and reactive   Lens Posterior chamber intraocular lens Posterior chamber intraocular lens   Anterior Vitreous Normal Normal       Fundus Exam      Right Left   Posterior Vitreous Posterior vitreous detachment    Disc Normal    C/D Ratio 0.2    Macula Hard drusen,  NO Cystoid macular edema, Microaneurysms, Retinal pigment epithelial mottling, no hemorrhage, no exudates    Vessels Old branch retinal vein occlusion    Periphery Normal           IMAGING AND PROCEDURES  Imaging and Procedures for 02/27/20  OCT, Retina - OU - Both Eyes       Right Eye Quality was good. Scan locations included subfoveal. Central Foveal Thickness: 224. Progression has improved. Findings include abnormal foveal contour, retinal drusen , subretinal hyper-reflective material.   Left Eye Scan locations included subfoveal. Central Foveal Thickness: 208. Findings include abnormal foveal contour, central retinal atrophy, outer retinal atrophy, inner retinal atrophy.   Notes With much less CME, currently at 5-week interval, repeat injection Avastin today and exam in 5 weeks       Intravitreal Injection, Pharmacologic Agent - OD - Right Eye       Time Out 02/27/2020. 9:13 AM. Confirmed correct patient, procedure, site, and patient consented.   Anesthesia Topical anesthesia was used.   Procedure Preparation included Ofloxacin , 10% betadine to eyelids, 5% betadine to ocular surface, Tobramycin 0.3%. A 30 gauge needle was used.    Injection:  1.25 mg Bevacizumab (AVASTIN) SOLN   NDC: [address]-4, Lot: [id number]   Route: Intravitreal, Site: Right Eye, Waste: 0 mg  Post-op Post injection exam found visual acuity of at least counting fingers. The patient tolerated the procedure well. There were no complications. The patient received written and verbal post procedure care education.                 ASSESSMENT/PLAN:  Branch retinal vein occlusion with macular edema of right eye OD  persistent less CNVM from BRVO at 5 Week interval, will repeat injection OD today and examination in 5 to 6 weeks   Early stage nonexudative age-related macular degeneration of both eyes The nature of age--related macular degeneration was discussed with the patient as well as the distinction between dry and wet types. Checking an Amsler Grid daily with advice to return immediately should a distortion develop, was given to the patient. The patient 's smoking status now and in the past was determined and advice based on the AREDS study was provided regarding the consumption of antioxidant supplements.  AREDS 2 vitamin formulation was recommended. Consumption of dark leafy vegetables and fresh fruits of various colors was recommended. Treatment modalities for wet macular degeneration particularly the use of intravitreal injections of anti-blood vessel growth factors was discussed with the patient. Avastin, Lucentis, and Eylea are the available options. On occasion, therapy includes the use of photodynamic therapy and thermal laser. Stressed to the patient do not rub eyes.  Patient was advised to check Amsler Grid daily and return immediately if changes are noted. Instructions on using the grid were given to the patient. All patient questions were answered.   OU  minor will observe      ICD-10-CM   1. Branch retinal vein occlusion with macular edema of right eye  H34.8310 OCT, Retina - OU - Both Eyes    Intravitreal Injection, Pharmacologic  Agent - OD - Right Eye    Bevacizumab (AVASTIN) SOLN 1.25 mg  2. Early stage nonexudative age-related macular degeneration of both eyes  H35.3131     1.  Much improved macular anatomy OD and stable visual acuity, at 5-week interval for recurrent CME from branch retinal vein occlusion.  Repeat injection OD today and examination again OD in 5 weeks  2.  3.  Ophthalmic Meds Ordered this visit:  Meds ordered this encounter  Medications  . Bevacizumab (AVASTIN) SOLN 1.25 mg       Return in about 5 weeks (around 04/02/2020) for dilate, OD, AVASTIN OCT.  Patient Instructions  To report any new onset visual acuity distortions or declines    Explained the diagnoses, plan, and follow up with the patient and they expressed understanding.  Patient expressed understanding of the importance of proper follow up care.   Clent Demark Terricka Onofrio M.D. Diseases & Surgery of the Retina and Vitreous [facility]ina [facility] Di[facility] [place] 02/27/20     Abbreviations: M myopia (nearsighted); A astigmatism; H hyperopia (farsighted); P presbyopia; Mrx spectacle prescription;  CTL contact lenses; OD right eye; OS left eye; OU both eyes  XT exotropia; ET esotropia; PEK punctate epithelial keratitis; PEE punctate epithelial erosions; DES dry eye syndrome; MGD meibomian gland dysfunction; ATs artificial tears; PFAT's preservative free artificial tears; [place] nuclear sclerotic cataract; PSC posterior subcapsular cataract; ERM epi-retinal membrane; PVD posterior vitreous detachment; RD retinal detachment; DM diabetes mellitus; DR diabetic retinopathy; NPDR non-proliferative diabetic retinopathy; PDR proliferative diabetic retinopathy; CSME clinically significant macular edema; DME diabetic macular edema; dbh dot blot hemorrhages; CWS cotton wool spot; POAG primary open angle glaucoma; C/D cup-to-disc ratio; HVF humphrey visual field; GVF goldmann visual field; OCT optical coherence tomography; IOP intraocular pressure; BRVO Branch  retinal vein occlusion; CRVO central retinal vein occlusion; CRAO central retinal artery occlusion; BRAO branch retinal artery occlusion; RT retinal tear; SB scleral buckle; PPV pars plana vitrectomy; VH Vitreous hemorrhage; PRP panretinal laser photocoagulation; IVK intravitreal kenalog; VMT vitreomacular traction; MH Macular hole;  NVD neovascularization of the disc; NVE neovascularization elsewhere; AREDS age related eye disease study; ARMD age related macular degeneration; POAG primary open angle glaucoma; EBMD epithelial/anterior basement membrane dystrophy; ACIOL anterior chamber intraocular lens; IOL intraocular lens; PCIOL posterior chamber intraocular lens; Phaco/IOL phacoemulsification with intraocular lens placement; [place] photorefractive keratectomy; LASIK laser assisted in situ keratomileusis; HTN hypertension; DM diabetes mellitus; COPD chronic obstructive pulmonary disease

## 2020-02-27 NOTE — Assessment & Plan Note (Signed)
OD  persistent less CNVM from BRVO at 5 Week interval, will repeat injection OD today and examination in 5 to 6 weeks

## 2020-02-27 NOTE — Patient Instructions (Signed)
To report any new onset visual acuity distortions or declines

## 2020-02-27 NOTE — Assessment & Plan Note (Addendum)
The nature of age--related macular degeneration was discussed with the patient as well as the distinction between dry and wet types. Checking an Amsler Grid daily with advice to return immediately should a distortion develop, was given to the patient. The patient 's smoking status now and in the past was determined and advice based on the AREDS study was provided regarding the consumption of antioxidant supplements. AREDS 2 vitamin formulation was recommended. Consumption of dark leafy vegetables and fresh fruits of various colors was recommended. Treatment modalities for wet macular degeneration particularly the use of intravitreal injections of anti-blood vessel growth factors was discussed with the patient. Avastin, Lucentis, and Eylea are the available options. On occasion, therapy includes the use of photodynamic therapy and thermal laser. Stressed to the patient do not rub eyes.  Patient was advised to check Amsler Grid daily and return immediately if changes are noted. Instructions on using the grid were given to the patient. All patient questions were answered.   OU  minor will observe

## 2020-03-21 ENCOUNTER — Other Ambulatory Visit: Payer: Self-pay

## 2020-03-21 ENCOUNTER — Ambulatory Visit (INDEPENDENT_AMBULATORY_CARE_PROVIDER_SITE_OTHER): Payer: PPO | Admitting: Sports Medicine

## 2020-03-21 ENCOUNTER — Encounter: Payer: Self-pay | Admitting: Sports Medicine

## 2020-03-21 DIAGNOSIS — I509 Heart failure, unspecified: Secondary | ICD-10-CM | POA: Diagnosis not present

## 2020-03-21 DIAGNOSIS — M79674 Pain in right toe(s): Secondary | ICD-10-CM

## 2020-03-21 DIAGNOSIS — E1122 Type 2 diabetes mellitus with diabetic chronic kidney disease: Secondary | ICD-10-CM | POA: Diagnosis not present

## 2020-03-21 DIAGNOSIS — E119 Type 2 diabetes mellitus without complications: Secondary | ICD-10-CM

## 2020-03-21 DIAGNOSIS — M79675 Pain in left toe(s): Secondary | ICD-10-CM | POA: Diagnosis not present

## 2020-03-21 DIAGNOSIS — N184 Chronic kidney disease, stage 4 (severe): Secondary | ICD-10-CM | POA: Diagnosis not present

## 2020-03-21 DIAGNOSIS — B351 Tinea unguium: Secondary | ICD-10-CM

## 2020-03-21 DIAGNOSIS — I480 Paroxysmal atrial fibrillation: Secondary | ICD-10-CM | POA: Diagnosis not present

## 2020-03-21 DIAGNOSIS — Z7901 Long term (current) use of anticoagulants: Secondary | ICD-10-CM

## 2020-03-21 NOTE — Progress Notes (Signed)
Subjective: Virginia Huber is a 74 y.o. female patient with history of diabetes who presents to office today complaining of long,mildly painful nails while ambulating in shoes; unable to trim. Patient states that the glucose reading this morning was ok. No other issues noted.  Patient Active Problem List   Diagnosis Date Noted   Early stage nonexudative age-related macular degeneration of both eyes 02/27/2020   OSA on CPAP 02/12/2020   Branch retinal vein occlusion with macular edema of right eye 10/11/2019   Severe nonproliferative diabetic retinopathy of right eye, with macular edema, associated with type 2 diabetes mellitus ([place]) 10/11/2019   Right retinal defect 10/11/2019   Posterior vitreous detachment of right eye 10/11/2019   Retinal microaneurysm of right eye 10/11/2019   CHF exacerbation ([place]) 08/25/2019   Acute exacerbation of CHF (congestive heart failure) ([place]) 08/24/2019   Acute on chronic diastolic HF (heart failure) ([place]) 12/17/2017   GERD (gastroesophageal reflux disease) 12/17/2017   Typical atrial flutter (HCC)    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/08/2017   Paroxysmal atrial fibrillation ([place]) 12/08/2017   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[place]) 12/08/2017   Chronic renal failure, stage 3b 09/23/2017   Fever 09/23/2017   S/P pericardiocentesis 06/28/2016   Acute blood loss anemia 06/28/2016   Pericardial effusion 06/26/2016   Tachy-brady syndrome ([place]) 06/25/2016   Tamponade    Bradycardia 06/14/2016   Junctional bradycardia    Coronary artery disease involving coronary bypass graft of native heart with unstable angina pectoris (HCC)    Acute diastolic CHF (congestive heart failure), NYHA class 3 (HCC)    Systolic congestive heart failure ([place]) 05/13/2016   Multiple and bilateral precerebral artery syndromes 05/13/2016   OSA (obstructive sleep apnea) 05/13/2016   Chronic diarrhea 12/25/2015   Chest pain  08/02/2015   Atrial fibrillation with rapid ventricular response (HCC)    Pain with urination 05/08/2015   NSTEMI (non-ST elevated myocardial infarction) ([place]) 04/02/2015   CAD in native artery 11/30/2014   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    PAD (peripheral artery disease) ([place]) 12/26/2013   Superficial fungus infection of skin 06/29/2013   UTI (urinary tract infection) 05/08/2013   Hypokalemia 03/05/2012   B12 deficiency anemia 03/02/2012   Bronchospasm 03/02/2012   Community acquired bacterial pneumonia 03/01/2012   Acute respiratory failure with hypoxia ([place]) 03/01/2012   Type 2 diabetes with nephropathy ([place]) 02/29/2012   Hypothyroidism 02/28/2012   RLQ abdominal pain 11/24/2010   OVERWEIGHT/OBESITY 06/03/2010   Essential hypertension 06/03/2010   Mixed hyperlipidemia 12/27/2009   Palpitations 05/17/2009   FRACTURE, TOE 12/06/2007   Current Outpatient Medications on File Prior to Visit  Medication Sig Dispense Refill   acetaminophen (TYLENOL) 325 MG tablet Take 650 mg by mouth every 6 (six) hours as needed for headache.     amiodarone (PACERONE) 200 MG tablet Take 0.5 tablets (100 mg total) by mouth daily. 45 tablet 1   apixaban (ELIQUIS) 5 MG TABS tablet Take 1 tablet (5 mg total) by mouth 2 (two) times daily. 28 tablet 0   atorvastatin (LIPITOR) 80 MG tablet TAKE ONE TABLET BY MOUTH EVERY DAY AT 6:00PM 90 tablet 3   carvedilol (COREG) 25 MG tablet Take 1 tablet (25 mg total) by mouth 2 (two) times daily. 90 tablet 1   Cholecalciferol (VITAMIN D3 PO) Take 1 tablet by mouth daily.      clopidogrel (PLAVIX) 75 MG tablet TAKE ONE TABLET (75MG  TOTAL)  BY MOUTH DAILY 90 tablet 3   cyanocobalamin (,VITAMIN B-12,) 1000 MCG/ML injection Inject 1,000 mcg into the muscle every 30 (thirty) days. Last injection was around 08/02/19     diltiazem (CARDIZEM CD) 360 MG 24 hr capsule Take 1 capsule (360 mg  total) by mouth daily. 90 capsule 3   Dulaglutide (TRULICITY) 8.18 EX/9.3ZJ SOPN Inject 0.5 mLs (0.75 mg total) into the skin once a week. 2 mL 11   FREESTYLE LITE test strip 1 each by Other route 2 (two) times daily.      furosemide (LASIX) 40 MG tablet TAKE ONE AND HALF TABLET (60MG  TOTAL) BY MOUTH IN THE MORNING AND ONE TABLET IN THE EVENING (40MG  TOTAL) 180 tablet 4   hydrALAZINE (APRESOLINE) 50 MG tablet Take 50 mg by mouth 3 (three) times daily.     insulin NPH-regular Human (NOVOLIN 70/30) (70-30) 100 UNIT/ML injection Inject 40 Units into the skin See admin instructions. Inject 25 units before breakfast and 25 units before supper  - only if pre-meal blood glucose is above 90 mg/dL. (Patient taking differently: Inject 40 Units into the skin See admin instructions. Inject 30 units before breakfast and 30 units before supper  - only if pre-meal blood glucose is above 90 mg/dL.) 10 mL 11   levothyroxine (SYNTHROID) 137 MCG tablet Take 137 mcg by mouth daily before breakfast.     nitroGLYCERIN (NITROSTAT) 0.4 MG SL tablet Place 1 tablet (0.4 mg total) under the tongue every 5 (five) minutes as needed for chest pain. 25 tablet 0   olmesartan (BENICAR) 40 MG tablet Take 40 mg by mouth daily.     potassium chloride SA (KLOR-CON) 20 MEQ tablet TAKE ONE (1) TABLET BY MOUTH EVERY DAY 90 tablet 1   rOPINIRole (REQUIP) 0.5 MG tablet Take 0.5 mg by mouth at bedtime.   0   No current facility-administered medications on file prior to visit.   Allergies  Allergen Reactions   Penicillins Hives    Has patient had a PCN reaction causing immediate rash, facial/tongue/throat swelling, SOB or lightheadedness with hypotension: Yes Has patient had a PCN reaction causing severe rash involving mucus membranes or skin necrosis: No Has patient had a PCN reaction that required hospitalization No Has patient had a PCN reaction occurring within the last 10 years: No If all of the above answers are "NO",  then may proceed with Cephalosporin use.   Percocet [Oxycodone-Acetaminophen] Nausea And Vomiting     Objective: General: Patient is awake, alert, and oriented x 3 and in no acute distress.  Integument: Skin is warm, dry and supple bilateral. Nails are tender, long, thickened and dystrophic with subungual debris, consistent with onychomycosis, 1-5 bilateral. Mild curvature of bilateral hallux nails with no acute ingrowing, No signs of infection. No open lesions or preulcerative lesions present bilateral. Remaining integument unremarkable.  Vasculature:  Dorsalis Pedis pulse 1/4 bilateral. Posterior Tibial pulse  0/4 bilateral.  Capillary fill time <3 sec 1-5 bilateral. Positive hair growth to the level of the digits. Temperature gradient within normal limits. No varicosities present bilateral. Trace edema present bilateral.   Neurology: Gross sensation present via light touch bilateral.  Protective sensation intact with Semmes Weinstein monofilament bilateral.  Musculoskeletal: Asymptomatic bunion and hammertoe with cross over R>L pedal deformities noted bilateral.  Strength within normal limits.  No other acute findings or tenderness to palpation bilateral.  Assessment and Plan: Problem List Items Addressed This Visit    None    Visit Diagnoses  Pain due to onychomycosis of toenails of both feet    -  Primary   Diabetes mellitus without complication (HCC)       Anticoagulated         -Examined patient. -Mechanically debrided all nails 1-5 bilateral using sterile nail nipper and filed with dremel without incident  -Advised good supportive shoes daily for foot type -Answered all patient questions -Patient to return  in 3 months for at risk foot care -Patient advised to call the office if any problems or questions arise in the meantime.  Landis Martins, DPM

## 2020-03-25 DIAGNOSIS — R748 Abnormal levels of other serum enzymes: Secondary | ICD-10-CM | POA: Diagnosis not present

## 2020-04-08 ENCOUNTER — Encounter (INDEPENDENT_AMBULATORY_CARE_PROVIDER_SITE_OTHER): Payer: Self-pay | Admitting: Ophthalmology

## 2020-04-08 ENCOUNTER — Encounter (INDEPENDENT_AMBULATORY_CARE_PROVIDER_SITE_OTHER): Payer: PPO | Admitting: Ophthalmology

## 2020-04-08 ENCOUNTER — Other Ambulatory Visit: Payer: Self-pay

## 2020-04-08 ENCOUNTER — Ambulatory Visit (INDEPENDENT_AMBULATORY_CARE_PROVIDER_SITE_OTHER): Payer: PPO | Admitting: Ophthalmology

## 2020-04-08 DIAGNOSIS — Z23 Encounter for immunization: Secondary | ICD-10-CM | POA: Diagnosis not present

## 2020-04-08 DIAGNOSIS — E119 Type 2 diabetes mellitus without complications: Secondary | ICD-10-CM | POA: Diagnosis not present

## 2020-04-08 DIAGNOSIS — E7849 Other hyperlipidemia: Secondary | ICD-10-CM | POA: Diagnosis not present

## 2020-04-08 DIAGNOSIS — H34831 Tributary (branch) retinal vein occlusion, right eye, with macular edema: Secondary | ICD-10-CM

## 2020-04-08 DIAGNOSIS — I2510 Atherosclerotic heart disease of native coronary artery without angina pectoris: Secondary | ICD-10-CM | POA: Diagnosis not present

## 2020-04-08 DIAGNOSIS — I4891 Unspecified atrial fibrillation: Secondary | ICD-10-CM | POA: Diagnosis not present

## 2020-04-08 DIAGNOSIS — Z6828 Body mass index (BMI) 28.0-28.9, adult: Secondary | ICD-10-CM | POA: Diagnosis not present

## 2020-04-08 MED ORDER — BEVACIZUMAB CHEMO INJECTION 1.25MG/0.05ML SYRINGE FOR KALEIDOSCOPE
1.2500 mg | INTRAVITREAL | Status: AC | PRN
  Administered 2020-04-08: 1.25 mg via INTRAVITREAL

## 2020-04-08 NOTE — Patient Instructions (Signed)
Patient instructed to notify the office promptly if new onset visual acuity disturbance or decline

## 2020-04-08 NOTE — Assessment & Plan Note (Signed)
The nature of branch retinal vein occlusion with macular edema was discussed.  The patient was given access to printed information.  The treatment options including continued observation looking for spontaneous resolution versus grid laser versus intravitreal Kenalog injection were discussed.  PRIMARY THERAPY CONSISTS of Anti-VEGF Therapies, AVASTIN, LUCENTIS AND EYLEA.  Their usage was discussed to assist in halting the progression of Macular Edema, in order to preserve, protect or improve acuity.  Additionally, at times, limited focal laser therapy is used in the management.  The risks and benefits of all these options were discussed with the patient.  The patient's questions were answered. 

## 2020-04-08 NOTE — Progress Notes (Signed)
04/08/2020     CHIEF COMPLAINT Patient presents for Retina Follow Up   HISTORY OF PRESENT ILLNESS: Virginia Huber is a 74 y.o. female who presents to the clinic today for:   HPI    Retina Follow Up    Patient presents with  CRVO/BRVO.  In right eye.  Severity is moderate.  Duration of 6 weeks.  Since onset it is stable.  I, the attending physician,  performed the HPI with the patient and updated documentation appropriately.          Comments    6 Week BRVO f\u OD. Possible Avastin OD. OCT  Pt states vision has been stable. Pt c/o multiple floaters in OD since last visit.       Last edited by Tilda Franco on 04/08/2020  2:37 PM. (History)      Referring physician: Sharilyn Sites, MD [address] [place],  [place] [address]  HISTORICAL INFORMATION:   Selected notes from the MEDICAL RECORD NUMBER    Lab Results  Component Value Date   HGBA1C 8.7 (A) 02/14/2020     CURRENT MEDICATIONS: No current outpatient medications on file. (Ophthalmic Drugs)   No current facility-administered medications for this visit. (Ophthalmic Drugs)   Current Outpatient Medications (Other)  Medication Sig  . acetaminophen (TYLENOL) 325 MG tablet Take 650 mg by mouth every 6 (six) hours as needed for headache.  [name] amiodarone (PACERONE) 200 MG tablet Take 0.5 tablets (100 mg total) by mouth daily.  [name] apixaban (ELIQUIS) 5 MG TABS tablet Take 1 tablet (5 mg total) by mouth 2 (two) times daily.  [name] atorvastatin (LIPITOR) 80 MG tablet TAKE ONE TABLET BY MOUTH EVERY DAY AT 6:00PM  . carvedilol (COREG) 25 MG tablet Take 1 tablet (25 mg total) by mouth 2 (two) times daily.  . Cholecalciferol (VITAMIN D3 PO) Take 1 tablet by mouth daily.   . clopidogrel (PLAVIX) 75 MG tablet TAKE ONE TABLET (75MG  TOTAL) BY MOUTH DAILY  . cyanocobalamin (,VITAMIN B-12,) 1000 MCG/ML injection Inject 1,000 mcg into the muscle every 30 (thirty) days. Last injection was around 08/02/19  . diltiazem (CARDIZEM  CD) 360 MG 24 hr capsule Take 1 capsule (360 mg total) by mouth daily.  . Dulaglutide (TRULICITY) 3.08 MV/7.8IO SOPN Inject 0.5 mLs (0.75 mg total) into the skin once a week.  [name] FREESTYLE LITE test strip 1 each by Other route 2 (two) times daily.   . furosemide (LASIX) 40 MG tablet TAKE ONE AND HALF TABLET (60MG  TOTAL) BY MOUTH IN THE MORNING AND ONE TABLET IN THE EVENING (40MG  TOTAL)  . hydrALAZINE (APRESOLINE) 50 MG tablet Take 50 mg by mouth 3 (three) times daily.  . insulin NPH-regular Human (NOVOLIN 70/30) (70-30) 100 UNIT/ML injection Inject 40 Units into the skin See admin instructions. Inject 25 units before breakfast and 25 units before supper  - only if pre-meal blood glucose is above 90 mg/dL. (Patient taking differently: Inject 40 Units into the skin See admin instructions. Inject 30 units before breakfast and 30 units before supper  - only if pre-meal blood glucose is above 90 mg/dL.)  . levothyroxine (SYNTHROID) 137 MCG tablet Take 137 mcg by mouth daily before breakfast.  . nitroGLYCERIN (NITROSTAT) 0.4 MG SL tablet Place 1 tablet (0.4 mg total) under the tongue every 5 (five) minutes as needed for chest pain.  [name] olmesartan (BENICAR) 40 MG tablet Take 40 mg by mouth daily.  . potassium chloride SA (KLOR-CON) 20 MEQ tablet TAKE  ONE (1) TABLET BY MOUTH EVERY DAY  . rOPINIRole (REQUIP) 0.5 MG tablet Take 0.5 mg by mouth at bedtime.    No current facility-administered medications for this visit. (Other)      REVIEW OF SYSTEMS:    ALLERGIES Allergies  Allergen Reactions  . Penicillins Hives    Has patient had a PCN reaction causing immediate rash, facial/tongue/throat swelling, SOB or lightheadedness with hypotension: Yes Has patient had a PCN reaction causing severe rash involving mucus membranes or skin necrosis: No Has patient had a PCN reaction that required hospitalization No Has patient had a PCN reaction occurring within the last 10 years: No If all of the above answers  are "NO", then may proceed with Cephalosporin use.  [name] Percocet [Oxycodone-Acetaminophen] Nausea And Vomiting    PAST MEDICAL HISTORY Past Medical History:  Diagnosis Date  . Arthritis   . Atrial fibrillation and flutter ([place])    a. h/o PAF/flutter during admission in 2013 for PNA. b. PAF during adm for NSTEMI 07/2015, subsequent paroxysms since then.  . B12 deficiency anemia   . Cardiac tamponade 06/2016  . CHF (congestive heart failure) ([place])   . Coronary artery disease 11/30/2014   a. remote MI. b. h/o PTCA with scoring balloon to OM1 11/2014. c. NSTEMI 03/2015 s/p DES to prox-mid Cx. d. NSTEMI 07/2015 s/p scoring balloon/PTCA/DES to dRCA with PAF during that admission  . Cutaneous lupus erythematosus   . GERD (gastroesophageal reflux disease)   . Heart block   . History of blood transfusion 1980's   2nd surgical procedures  . HTN (hypertension)   . Hypercholesteremia   . Hypothyroidism   . Myocardial infarction ([place]) 02/2012  . Ovarian tumor   . PAD (peripheral artery disease) ([place])    a. s/p LE angio 2015; followed by Dr. Fletcher Anon - managed medically.  . Pain with urination 05/08/2015  . Paroxysmal atrial flutter ([place])   . Pericardial effusion    a. 06/2016 after ppm - s/p pericardiocentesis.  . Superficial fungus infection of skin 06/29/2013  . Tachy-brady syndrome ([place])    a. s/p Medtronic PPM 06/2016, c/b lead perf/pericardial effusion.  [name] TIA (transient ischemic attack) 08/2001; ~ 2006  . Type II diabetes mellitus ([place])   . UTI (urinary tract infection) 05/08/2013   Past Surgical History:  Procedure Laterality Date  . ABDOMINAL AORTAGRAM N/A 01/03/2014   Procedure: ABDOMINAL [name];  Surgeon: Wellington Hampshire, MD;  Location: [facility] CATH LAB;  Service: Cardiovascular;  Laterality: N/A;  . ABDOMINAL HYSTERECTOMY  1972   "partial"  . APPENDECTOMY  1970's  . CARDIAC CATHETERIZATION  2008   Tiny OM-2 with 90% narrowing. Med tx.  [name] CARDIAC CATHETERIZATION N/A 11/30/2014    Procedure: Left Heart Cath and Coronary Angiography;  Surgeon: Troy Sine, MD; LAD 20%, CFX 50%, OM1 95%, right PLB 30%, LV normal   . CARDIAC CATHETERIZATION N/A 11/30/2014   Procedure: Coronary Balloon Angioplasty;  Surgeon: Troy Sine, MD;  Angiosculpt scoring balloon and PTCA to the OM1 reducing stenosis from 95% to less than 10%  . CARDIAC CATHETERIZATION N/A 04/03/2015   Procedure: Left Heart Cath and Coronary Angiography;  Surgeon: Jolaine Artist, MD; dLAD 50%, CFX 90%, OM1 100%, PLA 15%, LVEDP 13    . CARDIAC CATHETERIZATION N/A 04/03/2015   Procedure: Coronary Stent Intervention;  Surgeon: Sherren Mocha, MD; 3.0x18 mm Xience DES to the CFX    . CARDIAC CATHETERIZATION N/A 08/02/2015   Procedure: Left Heart Cath and Coronary Angiography;  Surgeon: Troy Sine, MD;  Location: [facility] CV LAB;  Service: Cardiovascular;  Laterality: N/A;  . CARDIAC CATHETERIZATION N/A 08/02/2015   Procedure: Coronary Stent Intervention;  Surgeon: Troy Sine, MD;  Location: [facility] CV LAB;  Service: Cardiovascular;  Laterality: N/A;  . CARDIAC CATHETERIZATION N/A 06/25/2016   Procedure: Pericardiocentesis;  Surgeon: Will Meredith Leeds, MD;  Location: [facility] CV LAB;  Service: Cardiovascular;  Laterality: N/A;  . cardiac stents    . CARDIOVERSION N/A 12/15/2017   Procedure: CARDIOVERSION;  Surgeon: Acie Fredrickson Wonda Cheng, MD;  Location: [facility] ENDOSCOPY;  Service: Cardiovascular;  Laterality: N/A;  . CHOLECYSTECTOMY OPEN  1990's  . COLONOSCOPY  2005   Dr. Laural Golden: pancolonic divericula, polyp, path unknown currently  . COLONOSCOPY  2012   Dr. Oneida Alar: Normal TI, scattered diverticula in entire colon, small internal hemorrhoids, normal colon biopsies. Colonoscopy in 5-10 years.   . COLOSTOMY  05/1979  . COLOSTOMY REVERSAL  11/1979  . EP IMPLANTABLE DEVICE N/A 06/25/2016   Procedure: Lead Revision/Repair;  Surgeon: Will Meredith Leeds, MD;  Location: [facility] CV LAB;  Service: Cardiovascular;   Laterality: N/A;  . EP IMPLANTABLE DEVICE N/A 06/25/2016   Procedure: Pacemaker Implant;  Surgeon: Will Meredith Leeds, MD;  Location: [facility] CV LAB;  Service: Cardiovascular;  Laterality: N/A;  . EXCISIONAL HEMORRHOIDECTOMY  1970's  . EYE SURGERY Left 2000   "branch vein occlusion"  . EYE SURGERY Left ~ 2001   "smoothed out wrinkle"  . LEFT OOPHORECTOMY  05/1979   nicked bowel, peritonitis, colostomy; colostomy reversed 1981   . LOWER EXTREMITY ANGIOGRAM N/A 01/03/2014   Procedure: LOWER EXTREMITY ANGIOGRAM;  Surgeon: Wellington Hampshire, MD;  Location: [facility] CATH LAB;  Service: Cardiovascular;  Laterality: N/A;  . Nuclear med stress test  10/2011   Small area of mild ischemia inferoapically.  [name] PARTIAL HYSTERECTOMY  1970's   left ovaries, then ovaries removed later due tumors   . RIGHT OOPHORECTOMY  1970's    FAMILY HISTORY Family History  Problem Relation Age of Onset  . Heart disease Mother        deceased  . Heart disease Father        deceased, heart disease  . Diabetes Brother   . Heart disease Brother   . Thyroid disease Brother   . Heart disease Sister   . Heart disease Brother   . Thyroid disease Brother   . Lupus Daughter   . Colon cancer Neg Hx     SOCIAL HISTORY Social History   Tobacco Use  . Smoking status: Never Smoker  . Smokeless tobacco: Never Used  . Tobacco comment: Never smoked  Vaping Use  . Vaping Use: Never used  Substance Use Topics  . Alcohol use: No    Alcohol/week: 0.0 standard drinks  . Drug use: No         OPHTHALMIC EXAM:  Base Eye Exam    Visual Acuity (Snellen - Linear)      Right Left   Dist cc 20/40 -1 CF @ 1'   Dist ph cc NI NI   Correction: Glasses       Tonometry (Tonopen, 2:41 PM)      Right Left   Pressure 13 13       Pupils      Pupils Dark Light Shape React APD   Right PERRL 4 3 Round Brisk None   Left PERRL 4 4 Round Minimal +1  Visual Fields (Counting fingers)      Left Right     Full    Restrictions Total superior temporal, inferior temporal deficiencies        Neuro/Psych    Oriented x3: Yes   Mood/Affect: Normal       Dilation    Right eye: 1.0% Mydriacyl, 2.5% Phenylephrine @ 2:41 PM        Slit Lamp and Fundus Exam    External Exam      Right Left   External Normal Normal       Slit Lamp Exam      Right Left   Lids/Lashes Normal Normal   Conjunctiva/Sclera White and quiet White and quiet   Cornea Clear Clear   Anterior Chamber Deep and quiet Deep and quiet   Iris Round and reactive Round and reactive   Lens Posterior chamber intraocular lens Posterior chamber intraocular lens   Anterior Vitreous Normal Normal       Fundus Exam      Right Left   Posterior Vitreous Posterior vitreous detachment, Central vitreous floaters    Disc Normal    C/D Ratio 0.2    Macula Hard drusen,   Cystoid macular edema, Microaneurysms, Retinal pigment epithelial mottling, no hemorrhage, no exudates    Vessels Old branch retinal vein occlusion    Periphery Normal           IMAGING AND PROCEDURES  Imaging and Procedures for 04/08/20  OCT, Retina - OU - Both Eyes       Right Eye Quality was good. Scan locations included subfoveal. Central Foveal Thickness: 255. Progression has been stable. Findings include cystoid macular edema.   Left Eye Quality was good. Scan locations included subfoveal. Central Foveal Thickness: 207. Progression has been stable. Findings include inner retinal atrophy, outer retinal atrophy, central retinal atrophy.   Notes Symptomatic interval, mild recurrent CME OD from BRVO.  Post Avastin OD.  We will repeat injection today and examination in 5 weeks       Intravitreal Injection, Pharmacologic Agent - OD - Right Eye       Time Out 04/08/2020. 3:41 PM. Confirmed correct patient, procedure, site, and patient consented.   Anesthesia Topical anesthesia was used. Anesthetic medications included Akten 3.5%.    Procedure Preparation included Ofloxacin , 10% betadine to eyelids, 5% betadine to ocular surface, Tobramycin 0.3%. A 30 gauge needle was used.   Injection:  1.25 mg Bevacizumab (AVASTIN) SOLN   NDC: 70360-001-02, Lot: [id number]   Route: Intravitreal, Site: Right Eye, Waste: 0 mg  Post-op Post injection exam found visual acuity of at least counting fingers. The patient tolerated the procedure well. There were no complications. The patient received written and verbal post procedure care education.                 ASSESSMENT/PLAN:  Branch retinal vein occlusion with macular edema of right eye The nature of branch retinal vein occlusion with macular edema was discussed.  The patient was given access to printed information.  The treatment options including continued observation looking for spontaneous resolution versus grid laser versus intravitreal Kenalog injection were discussed.  PRIMARY THERAPY CONSISTS of Anti-VEGF Therapies, AVASTIN, LUCENTIS AND EYLEA.  Their usage was discussed to assist in halting the progression of Macular Edema, in order to preserve, protect or improve acuity.  Additionally, at times, limited focal laser therapy is used in the management.  The risks and benefits of all these options were discussed with  the patient.  The patient's questions were answered.      ICD-10-CM   1. Branch retinal vein occlusion with macular edema of right eye  H34.8310 OCT, Retina - OU - Both Eyes    Intravitreal Injection, Pharmacologic Agent - OD - Right Eye    Bevacizumab (AVASTIN) SOLN 1.25 mg    1.  Chronic active CME from BRVO OD.  May at 6-week interval OD.  2.  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  3.  OD, repeat injection Avastin today and examination in 5 weeks  Ophthalmic Meds Ordered this visit:  Meds ordered this encounter  Medications  . Bevacizumab (AVASTIN) SOLN 1.25 mg       Return in about 5 weeks (around  05/13/2020) for dilate, OD, AVASTIN OCT.  Patient Instructions  Patient instructed to notify the office promptly if new onset visual acuity disturbance or decline    Explained the diagnoses, plan, and follow up with the patient and they expressed understanding.  Patient expressed understanding of the importance of proper follow up care.   Clent Demark Illona Bulman M.D. Diseases & Surgery of the Retina and Vitreous [facility] [place] 04/08/20     Abbreviations: M myopia (nearsighted); A astigmatism; H hyperopia (farsighted); P presbyopia; Mrx spectacle prescription;  CTL contact lenses; OD right eye; OS left eye; OU both eyes  XT exotropia; ET esotropia; PEK punctate epithelial keratitis; PEE punctate epithelial erosions; DES dry eye syndrome; MGD meibomian gland dysfunction; ATs artificial tears; PFAT's preservative free artificial tears; [place] nuclear sclerotic cataract; PSC posterior subcapsular cataract; ERM epi-retinal membrane; PVD posterior vitreous detachment; RD retinal detachment; DM diabetes mellitus; DR diabetic retinopathy; NPDR non-proliferative diabetic retinopathy; PDR proliferative diabetic retinopathy; CSME clinically significant macular edema; DME diabetic macular edema; dbh dot blot hemorrhages; CWS cotton wool spot; POAG primary open angle glaucoma; C/D cup-to-disc ratio; HVF humphrey visual field; GVF goldmann visual field; OCT optical coherence tomography; IOP intraocular pressure; BRVO Branch retinal vein occlusion; CRVO central retinal vein occlusion; CRAO central retinal artery occlusion; BRAO branch retinal artery occlusion; RT retinal tear; SB scleral buckle; PPV pars plana vitrectomy; VH Vitreous hemorrhage; PRP panretinal laser photocoagulation; IVK intravitreal kenalog; VMT vitreomacular traction; MH Macular hole;  NVD neovascularization of the disc; NVE neovascularization elsewhere; AREDS age related eye disease study; ARMD age related macular degeneration; POAG primary  open angle glaucoma; EBMD epithelial/anterior basement membrane dystrophy; ACIOL anterior chamber intraocular lens; IOL intraocular lens; PCIOL posterior chamber intraocular lens; Phaco/IOL phacoemulsification with intraocular lens placement; [place] photorefractive keratectomy; LASIK laser assisted in situ keratomileusis; HTN hypertension; DM diabetes mellitus; COPD chronic obstructive pulmonary disease

## 2020-04-09 ENCOUNTER — Ambulatory Visit (INDEPENDENT_AMBULATORY_CARE_PROVIDER_SITE_OTHER): Payer: PPO

## 2020-04-09 DIAGNOSIS — E119 Type 2 diabetes mellitus without complications: Secondary | ICD-10-CM | POA: Diagnosis not present

## 2020-04-09 DIAGNOSIS — E7849 Other hyperlipidemia: Secondary | ICD-10-CM | POA: Diagnosis not present

## 2020-04-09 DIAGNOSIS — I495 Sick sinus syndrome: Secondary | ICD-10-CM

## 2020-04-09 DIAGNOSIS — Z23 Encounter for immunization: Secondary | ICD-10-CM | POA: Diagnosis not present

## 2020-04-09 DIAGNOSIS — I2510 Atherosclerotic heart disease of native coronary artery without angina pectoris: Secondary | ICD-10-CM | POA: Diagnosis not present

## 2020-04-09 DIAGNOSIS — Z6828 Body mass index (BMI) 28.0-28.9, adult: Secondary | ICD-10-CM | POA: Diagnosis not present

## 2020-04-09 DIAGNOSIS — I4891 Unspecified atrial fibrillation: Secondary | ICD-10-CM | POA: Diagnosis not present

## 2020-04-09 LAB — CUP PACEART REMOTE DEVICE CHECK
Battery Remaining Longevity: 74 mo
Battery Voltage: 3.01 V
Brady Statistic AP VP Percent: 0.06 %
Brady Statistic AP VS Percent: 92.75 %
Brady Statistic AS VP Percent: 0.02 %
Brady Statistic AS VS Percent: 7.17 %
Brady Statistic RA Percent Paced: 92.8 %
Brady Statistic RV Percent Paced: 0.08 %
Date Time Interrogation Session: 20211019101718
Implantable Lead Implant Date: 20180104
Implantable Lead Implant Date: 20180104
Implantable Lead Location: 753859
Implantable Lead Location: 753860
Implantable Lead Model: 5076
Implantable Lead Model: 5076
Implantable Pulse Generator Implant Date: 20180104
Lead Channel Impedance Value: 323 Ohm
Lead Channel Impedance Value: 380 Ohm
Lead Channel Impedance Value: 380 Ohm
Lead Channel Impedance Value: 475 Ohm
Lead Channel Pacing Threshold Amplitude: 0.625 V
Lead Channel Pacing Threshold Amplitude: 1 V
Lead Channel Pacing Threshold Pulse Width: 0.4 ms
Lead Channel Pacing Threshold Pulse Width: 0.4 ms
Lead Channel Sensing Intrinsic Amplitude: 15.875 mV
Lead Channel Sensing Intrinsic Amplitude: 15.875 mV
Lead Channel Sensing Intrinsic Amplitude: 2 mV
Lead Channel Sensing Intrinsic Amplitude: 2 mV
Lead Channel Setting Pacing Amplitude: 2 V
Lead Channel Setting Pacing Amplitude: 2.5 V
Lead Channel Setting Pacing Pulse Width: 0.4 ms
Lead Channel Setting Sensing Sensitivity: 2.8 mV

## 2020-04-12 ENCOUNTER — Telehealth: Payer: Self-pay | Admitting: Cardiology

## 2020-04-12 NOTE — Telephone Encounter (Signed)
Samples eliquis 5 mg # 42    lot [id number], exp 12/2021

## 2020-04-12 NOTE — Telephone Encounter (Signed)
New message    Virginia Huber wants to talk to Cathi about her Eliquis- she has a couple questions

## 2020-04-15 ENCOUNTER — Ambulatory Visit: Payer: PPO | Admitting: Internal Medicine

## 2020-04-15 ENCOUNTER — Ambulatory Visit: Payer: PPO | Admitting: Endocrinology

## 2020-04-15 ENCOUNTER — Other Ambulatory Visit: Payer: Self-pay

## 2020-04-15 ENCOUNTER — Encounter: Payer: Self-pay | Admitting: Internal Medicine

## 2020-04-15 VITALS — BP 140/78 | HR 83 | Ht 63.0 in | Wt 158.8 lb

## 2020-04-15 DIAGNOSIS — E1129 Type 2 diabetes mellitus with other diabetic kidney complication: Secondary | ICD-10-CM | POA: Diagnosis not present

## 2020-04-15 DIAGNOSIS — I5032 Chronic diastolic (congestive) heart failure: Secondary | ICD-10-CM | POA: Diagnosis not present

## 2020-04-15 DIAGNOSIS — I129 Hypertensive chronic kidney disease with stage 1 through stage 4 chronic kidney disease, or unspecified chronic kidney disease: Secondary | ICD-10-CM | POA: Diagnosis not present

## 2020-04-15 DIAGNOSIS — E1165 Type 2 diabetes mellitus with hyperglycemia: Secondary | ICD-10-CM

## 2020-04-15 DIAGNOSIS — N189 Chronic kidney disease, unspecified: Secondary | ICD-10-CM | POA: Diagnosis not present

## 2020-04-15 DIAGNOSIS — E1159 Type 2 diabetes mellitus with other circulatory complications: Secondary | ICD-10-CM

## 2020-04-15 DIAGNOSIS — E1122 Type 2 diabetes mellitus with diabetic chronic kidney disease: Secondary | ICD-10-CM | POA: Diagnosis not present

## 2020-04-15 DIAGNOSIS — R809 Proteinuria, unspecified: Secondary | ICD-10-CM | POA: Diagnosis not present

## 2020-04-15 LAB — POCT GLYCOSYLATED HEMOGLOBIN (HGB A1C): Hemoglobin A1C: 7.3 % — AB (ref 4.0–5.6)

## 2020-04-15 MED ORDER — TRULICITY 1.5 MG/0.5ML ~~LOC~~ SOAJ
1.5000 mg | SUBCUTANEOUS | 3 refills | Status: DC
Start: 2020-04-15 — End: 2020-06-24

## 2020-04-15 NOTE — Progress Notes (Signed)
Patient ID: Virginia Huber, female   DOB: Jan 08, 1946, 74 y.o.   MRN: [id number]   This visit occurred during the SARS-CoV-2 public health emergency.  Safety protocols were in place, including screening questions prior to the visit, additional usage of staff PPE, and extensive cleaning of exam room while observing appropriate contact time as indicated for disinfecting solutions.   HPI: Virginia Huber is a 74 y.o.-year-old female, referred by her PCP, Dr.Golding, for management of DM2, dx in ~2005, insulin-dependent since 2006, uncontrolled, with complications (CAD, s/p NSTEMI, s/p PTCA and DES; CHF; PAF/flutter; PAD; CKD; cerebrovascular disease - s/p TIA; PN).  She previously saw my colleague, Dr. Loanne Drilling.  She would like to switch to see me.  Reviewed HbA1c: Lab Results  Component Value Date   HGBA1C 8.7 (A) 02/14/2020   HGBA1C 7.7 12/04/2019   HGBA1C 7.0 (H) 08/24/2019   HGBA1C 6.8 (H) 12/17/2017   HGBA1C 6.6 (H) 08/05/2017   HGBA1C 6.9 (H) 04/30/2017   HGBA1C 6.9 (H) 01/25/2017   HGBA1C 7.1 (H) 10/22/2016   HGBA1C 6.3 (H) 07/21/2016   HGBA1C 7.2 (H) 06/07/2016   Pt is on a regimen of: - NPH/R 70/30 insulin (vial) 40 >> 35-36 units twice a day - Trulicity 4.09 mg weekly - started 01/2020 - no nausea. No FH of MTC or personal h/o pancreatitis. She was on Metformin >> stopped b/c CKD.  Pt checks her sugars 2x a day and they are: - am: 96-166, 180 - 2h after b'fast: n/c - before lunch: n/c - 2h after lunch: n/c - before dinner: 76, 84-154, 182 - 2h after dinner: n/c - bedtime: n/c - nighttime: n/c Lowest sugar was 60s (after dinner) - not recently; she has hypoglycemia awareness in the 70s.  Highest sugar was upper 200s.  Glucometer: Freestyle lite  Patient starting to improve her meals after her last HbA1c returned: - Brunch: scrambled egg + bacon + rye bread or cheerios + skim milk - Dinner: meat - grilled + veggies - Snacks: seldom - apple Drinks occasional diet  drinks.  - + CKD, last BUN/creatinine:  Lab Results  Component Value Date   BUN 24 (A) 12/04/2019   BUN 29 (H) 08/26/2019   CREATININE 1.5 (A) 12/04/2019   CREATININE 1.52 (H) 08/26/2019  On Benicar 40.  -+ HL; last set of lipids: Lab Results  Component Value Date   CHOL 166 08/05/2017   HDL 43 (L) 08/05/2017   LDLCALC 96 08/05/2017   TRIG 160 (H) 08/05/2017   CHOLHDL 3.9 08/05/2017  On Lipitor 80.  - last eye exam was in 03/2020. + DR. Dr Zadie Rhine. + IO inj's - monthly.  - + numbness and tingling in her feet.  Pt has FH of DM in M and her family.  She also has a history of hypothyroidism, on levothyroxine 137 mcg daily.  TSH levels were reviewed -normalized last month: Lab Results  Component Value Date   TSH 2.410 02/22/2020   TSH 0.424 (L) 08/15/2019   TSH 0.059 (L) 12/17/2017   TSH 0.05 (L) 12/06/2017   TSH 11.574 (H) 09/23/2017   TSH 12.65 (H) 08/05/2017   TSH 4.97 (H) 04/30/2017   TSH 5.64 (H) 01/25/2017   TSH 1.53 10/22/2016   TSH 2.680 09/25/2016   She  has an extensive cardiac history.  Besides mentioned in HPI, she also has a history of heart block, tachybradycardia syndrome, pericardial effusion (history of tamponade).  She is on amiodarone, Eliquis, Plavix, Lasix.  ROS: Constitutional: no weight gain, + weight loss, no fatigue, no subjective hyperthermia, no subjective hypothermia, no nocturia Eyes: + blurry vision, no xerophthalmia ENT: no sore throat, no nodules palpated in neck, no dysphagia, no odynophagia, no hoarseness, no tinnitus, no hypoacusis Cardiovascular: no CP, no SOB, no palpitations, no leg swelling Respiratory: no cough, no SOB, no wheezing Gastrointestinal: no N, no V, no D, no C, no acid reflux Musculoskeletal: no muscle, no joint aches Skin: no rash, no hair loss Neurological: no tremors, no numbness or tingling/+ dizziness/no HAs Psychiatric: no depression, no anxiety  Past Medical History:  Diagnosis Date   Arthritis     Atrial fibrillation and flutter ([place])    a. h/o PAF/flutter during admission in 2013 for PNA. b. PAF during adm for NSTEMI 07/2015, subsequent paroxysms since then.   B12 deficiency anemia    Cardiac tamponade 06/2016   CHF (congestive heart failure) (HCC)    Coronary artery disease 11/30/2014   a. remote MI. b. h/o PTCA with scoring balloon to OM1 11/2014. c. NSTEMI 03/2015 s/p DES to prox-mid Cx. d. NSTEMI 07/2015 s/p scoring balloon/PTCA/DES to dRCA with PAF during that admission   Cutaneous lupus erythematosus    GERD (gastroesophageal reflux disease)    Heart block    History of blood transfusion 1980's   2nd surgical procedures   HTN (hypertension)    Hypercholesteremia    Hypothyroidism    Myocardial infarction ([place]) 02/2012   Ovarian tumor    PAD (peripheral artery disease) ([place])    a. s/p LE angio 2015; followed by Dr. Fletcher Anon - managed medically.   Pain with urination 05/08/2015   Paroxysmal atrial flutter (HCC)    Pericardial effusion    a. 06/2016 after ppm - s/p pericardiocentesis.   Superficial fungus infection of skin 06/29/2013   Tachy-brady syndrome ([place])    a. s/p Medtronic PPM 06/2016, c/b lead perf/pericardial effusion.   TIA (transient ischemic attack) 08/2001; ~ 2006   Type II diabetes mellitus ([place])    UTI (urinary tract infection) 05/08/2013   Past Surgical History:  Procedure Laterality Date   ABDOMINAL AORTAGRAM N/A 01/03/2014   Procedure: ABDOMINAL [name];  Surgeon: Wellington Hampshire, MD;  Location: [facility]ATH [facility];  Service: Cardiovascular;  Laterality: N/A;   ABDOMINAL HYSTERECTOMY  1972   "partial"   APPENDECTOMY  1970's   CARDIAC CATHETERIZATION  2008   Tiny OM-2 with 90% narrowing. Med tx.   CARDIAC CATHETERIZATION N/A 11/30/2014   Procedure: Left Heart Cath and Coronary Angiography;  Surgeon: Troy Sine, MD; LAD 20%, CFX 50%, OM1 95%, right PLB 30%, LV normal    CARDIAC CATHETERIZATION N/A 11/30/2014   Procedure: Coronary  Balloon Angioplasty;  Surgeon: Troy Sine, MD;  Angiosculpt scoring balloon and PTCA to the OM1 reducing stenosis from 95% to less than 10%   CARDIAC CATHETERIZATION N/A 04/03/2015   Procedure: Left Heart Cath and Coronary Angiography;  Surgeon: Jolaine Artist, MD; dLAD 50%, CFX 90%, OM1 100%, PLA 15%, LVEDP 13     CARDIAC CATHETERIZATION N/A 04/03/2015   Procedure: Coronary Stent Intervention;  Surgeon: Sherren Mocha, MD; 3.0x18 mm Xience DES to the CFX     CARDIAC CATHETERIZATION N/A 08/02/2015   Procedure: Left Heart Cath and Coronary Angiography;  Surgeon: Troy Sine, MD;  Location: [facility] CV LAB;  Service: Cardiovascular;  Laterality: N/A;   CARDIAC CATHETERIZATION N/A 08/02/2015   Procedure: Coronary Stent Intervention;  Surgeon: Troy Sine, MD;  Location: [facility]  INVASIVE CV LAB;  Service: Cardiovascular;  Laterality: N/A;   CARDIAC CATHETERIZATION N/A 06/25/2016   Procedure: Pericardiocentesis;  Surgeon: Will Meredith Leeds, MD;  Location: [facility] CV LAB;  Service: Cardiovascular;  Laterality: N/A;   cardiac stents     CARDIOVERSION N/A 12/15/2017   Procedure: CARDIOVERSION;  Surgeon: Acie Fredrickson Wonda Cheng, MD;  Location: [facility] ENDOSCOPY;  Service: Cardiovascular;  Laterality: N/A;   CHOLECYSTECTOMY OPEN  1990's   COLONOSCOPY  2005   Dr. Laural Golden: pancolonic divericula, polyp, path unknown currently   COLONOSCOPY  2012   Dr. Oneida Alar: Normal TI, scattered diverticula in entire colon, small internal hemorrhoids, normal colon biopsies. Colonoscopy in 5-10 years.    COLOSTOMY  05/1979   COLOSTOMY REVERSAL  11/1979   EP IMPLANTABLE DEVICE N/A 06/25/2016   Procedure: Lead Revision/Repair;  Surgeon: Will Meredith Leeds, MD;  Location: [facility] CV LAB;  Service: Cardiovascular;  Laterality: N/A;   EP IMPLANTABLE DEVICE N/A 06/25/2016   Procedure: Pacemaker Implant;  Surgeon: Will Meredith Leeds, MD;  Location: [place] [facility] CV LAB;  Service: Cardiovascular;  Laterality: N/A;    EXCISIONAL HEMORRHOIDECTOMY  1970's   EYE SURGERY Left 2000   "branch vein occlusion"   EYE SURGERY Left ~ 2001   "smoothed out wrinkle"   LEFT OOPHORECTOMY  05/1979   nicked bowel, peritonitis, colostomy; colostomy reversed 1981    LOWER EXTREMITY ANGIOGRAM N/A 01/03/2014   Procedure: LOWER EXTREMITY ANGIOGRAM;  Surgeon: Wellington Hampshire, MD;  Location: [facility] CATH LAB;  Service: Cardiovascular;  Laterality: N/A;   Nuclear med stress test  10/2011   Small area of mild ischemia inferoapically.   PARTIAL HYSTERECTOMY  1970's   left ovaries, then ovaries removed later due tumors    RIGHT OOPHORECTOMY  1970's   Social History   Socioeconomic History   Marital status: Married    Spouse name: Not on file   Number of children: Not on file   Years of education: Not on file   Highest education level: Not on file  Occupational History   Occupation: Retired    Fish farm manager: RETIRED    Comment: insurance billing  Tobacco Use   Smoking status: Never Smoker   Smokeless tobacco: Never Used   Tobacco comment: Never smoked  Vaping Use   Vaping Use: Never used  Substance and Sexual Activity   Alcohol use: No    Alcohol/week: 0.0 standard drinks   Drug use: No   Sexual activity: Never    Birth control/protection: Surgical    Comment: hyst  Other Topics Concern   Not on file  Social History Narrative   Not on file   Social Determinants of Health   Financial Resource Strain:    Difficulty of Paying Living Expenses: Not on file  Food Insecurity:    Worried About Charity fundraiser in the Last Year: Not on file   [place] in the Last Year: Not on file  Transportation Needs:    Lack of Transportation (Medical): Not on file   Lack of Transportation (Non-Medical): Not on file  Physical Activity:    Days of Exercise per Week: Not on file   Minutes of Exercise per Session: Not on file  Stress:    Feeling of Stress : Not on file  Social Connections:     Frequency of Communication with Friends and Family: Not on file   Frequency of Social Gatherings with Friends and Family: Not on file   Attends Religious Services: Not on  file   Active Member of Clubs or Organizations: Not on file   Attends Archivist Meetings: Not on file   Marital Status: Not on file  Intimate Partner Violence:    Fear of Current or Ex-Partner: Not on file   Emotionally Abused: Not on file   Physically Abused: Not on file   Sexually Abused: Not on file   Current Outpatient Medications on File Prior to Visit  Medication Sig Dispense Refill   acetaminophen (TYLENOL) 325 MG tablet Take 650 mg by mouth every 6 (six) hours as needed for headache.     amiodarone (PACERONE) 200 MG tablet Take 0.5 tablets (100 mg total) by mouth daily. 45 tablet 1   apixaban (ELIQUIS) 5 MG TABS tablet Take 1 tablet (5 mg total) by mouth 2 (two) times daily. 28 tablet 0   atorvastatin (LIPITOR) 80 MG tablet TAKE ONE TABLET BY MOUTH EVERY DAY AT 6:00PM 90 tablet 3   carvedilol (COREG) 25 MG tablet Take 1 tablet (25 mg total) by mouth 2 (two) times daily. 90 tablet 1   Cholecalciferol (VITAMIN D3 PO) Take 1 tablet by mouth daily.      clopidogrel (PLAVIX) 75 MG tablet TAKE ONE TABLET (75MG  TOTAL) BY MOUTH DAILY 90 tablet 3   cyanocobalamin (,VITAMIN B-12,) 1000 MCG/ML injection Inject 1,000 mcg into the muscle every 30 (thirty) days. Last injection was around 08/02/19     diltiazem (CARDIZEM CD) 360 MG 24 hr capsule Take 1 capsule (360 mg total) by mouth daily. 90 capsule 3   Dulaglutide (TRULICITY) 1.61 WR/6.0AV SOPN Inject 0.5 mLs (0.75 mg total) into the skin once a week. 2 mL 11   FREESTYLE LITE test strip 1 each by Other route 2 (two) times daily.      furosemide (LASIX) 40 MG tablet TAKE ONE AND HALF TABLET (60MG  TOTAL) BY MOUTH IN THE MORNING AND ONE TABLET IN THE EVENING (40MG  TOTAL) 180 tablet 4   hydrALAZINE (APRESOLINE) 50 MG tablet Take 50 mg by mouth 3  (three) times daily.     insulin NPH-regular Human (NOVOLIN 70/30) (70-30) 100 UNIT/ML injection Inject 40 Units into the skin See admin instructions. Inject 25 units before breakfast and 25 units before supper  - only if pre-meal blood glucose is above 90 mg/dL. (Patient taking differently: Inject 40 Units into the skin See admin instructions. Inject 30 units before breakfast and 30 units before supper  - only if pre-meal blood glucose is above 90 mg/dL.) 10 mL 11   levothyroxine (SYNTHROID) 137 MCG tablet Take 137 mcg by mouth daily before breakfast.     nitroGLYCERIN (NITROSTAT) 0.4 MG SL tablet Place 1 tablet (0.4 mg total) under the tongue every 5 (five) minutes as needed for chest pain. 25 tablet 0   olmesartan (BENICAR) 40 MG tablet Take 40 mg by mouth daily.     potassium chloride SA (KLOR-CON) 20 MEQ tablet TAKE ONE (1) TABLET BY MOUTH EVERY DAY 90 tablet 1   rOPINIRole (REQUIP) 0.5 MG tablet Take 0.5 mg by mouth at bedtime.   0   No current facility-administered medications on file prior to visit.   Allergies  Allergen Reactions   Penicillins Hives    Has patient had a PCN reaction causing immediate rash, facial/tongue/throat swelling, SOB or lightheadedness with hypotension: Yes Has patient had a PCN reaction causing severe rash involving mucus membranes or skin necrosis: No Has patient had a PCN reaction that required hospitalization No Has patient had a  PCN reaction occurring within the last 10 years: No If all of the above answers are "NO", then may proceed with Cephalosporin use.   Percocet [Oxycodone-Acetaminophen] Nausea And Vomiting   Family History  Problem Relation Age of Onset   Heart disease Mother        deceased   Heart disease Father        deceased, heart disease   Diabetes Brother    Heart disease Brother    Thyroid disease Brother    Heart disease Sister    Heart disease Brother    Thyroid disease Brother    Lupus Daughter    Colon cancer  Neg Hx     PE: BP 140/78    Pulse 83    Ht 5\' 3"  (1.6 m)    Wt 158 lb 12.8 oz (72 kg)    SpO2 97%    BMI 28.13 kg/m  Wt Readings from Last 3 Encounters:  04/15/20 158 lb 12.8 oz (72 kg)  02/22/20 164 lb 6.4 oz (74.6 kg)  02/14/20 160 lb 6.4 oz (72.8 kg)   Constitutional: overweight, in NAD Eyes: PERRLA, EOMI, no exophthalmos ENT: moist mucous membranes, no thyromegaly, no cervical lymphadenopathy Cardiovascular: RRR, No MRG Respiratory: CTA B Gastrointestinal: abdomen soft, NT, ND, BS+ Musculoskeletal: no deformities, strength intact in all 4 Skin: moist, warm, no rashes Neurological: no tremor with outstretched hands, DTR normal in all 4  ASSESSMENT: 1. DM2, insulin-dependent, uncontrolled, withcomplications - CAD, s/p NSTEMI 07/2015 and 03/2015, s/p PTCA and DES - CHF - PAF/flutter - PAD - cerebrovascular disease - s/p TIA - CKD  PLAN:  1. Patient with long-standing, uncontrolled diabetes, on premixed insulin regimen, which became insufficient.  Latest HbA1c was 8.7%.  She has extensive cardiac history, so our target for her would be an HbA1c less than 7%. Today: 7.3% (better). - pt mentions that after her last HbA1c returned, she started to change her diet and she also added Trulicity.  She tolerates the lower dose well, without any GI symptoms. - She is doing a good job checking her blood sugars twice a day and it is clear that they improved in the last 2 months.  In the morning, the majority of her blood sugars are at goal but she has occasional spikes in the 150s to 180.  Also, before dinner, sugars are mostly at goal with occasional mild hyperglycemic spikes. - we discussed at this visit about increasing the dose of Trulicity, since the lower dose is only used to improve tolerance.  She agrees with this.  In this case, we can decrease the dose of her premixed insulin to avoid hypoglycemia.  She is in the donut hole and Trulicity is expensive for her.  We gave her 2 sample  pens. - at this visit, I also advised her to check with her nephrologist if we can use an SGLT2 inhibitor since this would be very beneficial for her from the cardiovascular and renal point of view.  We can add this at next visit, if affordable, while reducing her insulin doses. - I suggested to:  Patient Instructions  Please decrease: - NPH/R 70/30 28-30 units 2x a day 30 min before brunch and dinner  Increase: - Trulicity to 1.5 mg weekly  Please let me know if the sugars are consistently <80 or >200.  Please check with your kidney dr. If we can use SGLT2 inhibitors: Vania Rea, Augustina Mood, Stegaltro.  Please return in 3 months with your sugar log.   -  continue checking sugars at different times of the day - check 2-3x a day, rotating checks - discussed about CBG targets for treatment: 80-130 mg/dL before meals and <180 mg/dL after meals; target HbA1c <7%. - given sugar log and advised how to fill it and to bring it at next appt  - given foot care handout and explained the principles  - given instructions for hypoglycemia management "15-15 rule"  - advised for yearly eye exams  - Return to clinic in 3 mo with sugar log   - Total time spent for the visit: 45 min, in obtaining medical information from the chart, reviewing her  previous labs, blood sugars, OV notes, treatments including insulin doses, reviewing her symptoms, counseling her about her condition (please see the discussed topics above), and developing a plan to further treat it; she had a number of questions which I addressed.   Philemon Kingdom, MD PhD [facility]

## 2020-04-15 NOTE — Patient Instructions (Addendum)
Please decrease: - NPH/R 70/30 28-30 units 2x a day 30 min before brunch and dinner  Increase: - Trulicity to 1.5 mg weekly  Please let me know if the sugars are consistently <80 or >200.  Please check with your kidney dr. If we can use SGLT2 inhibitors: Vania Rea, Augustina Mood, Stegaltro.  Please return in 3 months with your sugar log.   PATIENT INSTRUCTIONS FOR TYPE 2 DIABETES:  DIET AND EXERCISE Diet and exercise is an important part of diabetic treatment.  We recommended aerobic exercise in the form of brisk walking (working between 40-60% of maximal aerobic capacity, similar to brisk walking) for 150 minutes per week (such as 30 minutes five days per week) along with 3 times per week performing 'resistance' training (using various gauge rubber tubes with handles) 5-10 exercises involving the major muscle groups (upper body, lower body and core) performing 10-15 repetitions (or near fatigue) each exercise. Start at half the above goal but build slowly to reach the above goals. If limited by weight, joint pain, or disability, we recommend daily walking in a swimming pool with water up to waist to reduce pressure from joints while allow for adequate exercise.    BLOOD GLUCOSES Monitoring your blood glucoses is important for continued management of your diabetes. Please check your blood glucoses 2-4 times a day: fasting, before meals and at bedtime (you can rotate these measurements - e.g. one day check before the 3 meals, the next day check before 2 of the meals and before bedtime, etc.).   HYPOGLYCEMIA (low blood sugar) Hypoglycemia is usually a reaction to not eating, exercising, or taking too much insulin/ other diabetes drugs.  Symptoms include tremors, sweating, hunger, confusion, headache, etc. Treat IMMEDIATELY with 15 grams of Carbs: . 4 glucose tablets .  cup regular juice/soda . 2 tablespoons raisins . 4 teaspoons sugar . 1 tablespoon honey Recheck blood glucose in 15  mins and repeat above if still symptomatic/blood glucose <100.  RECOMMENDATIONS TO REDUCE YOUR RISK OF DIABETIC COMPLICATIONS: * Take your prescribed MEDICATION(S) * Follow a DIABETIC diet: Complex carbs, fiber rich foods, (monounsaturated and polyunsaturated) fats * AVOID saturated/trans fats, high fat foods, >2,300 mg salt per day. * EXERCISE at least 5 times a week for 30 minutes or preferably daily.  * DO NOT SMOKE OR DRINK more than 1 drink a day. * Check your FEET every day. Do not wear tightfitting shoes. Contact us if you develop an ulcer * See your EYE doctor once a year or more if needed * Get a FLU shot once a year * Get a PNEUMONIA vaccine once before and once after age 50 years  GOALS:  * Your Hemoglobin A1c of <7%  * fasting sugars need to be <130 * after meals sugars need to be <180 (2h after you start eating) * Your Systolic BP should be 170 or lower  * Your Diastolic BP should be 80 or lower  * Your HDL (Good Cholesterol) should be 40 or higher  * Your LDL (Bad Cholesterol) should be 100 or lower. * Your Triglycerides should be 150 or lower  * Your Urine microalbumin (kidney function) should be <30 * Your Body Mass Index should be 25 or lower    Please consider the following ways to cut down carbs and fat and increase fiber and micronutrients in your diet: - substitute whole grain for white bread or pasta - substitute brown rice for white rice - substitute 90-calorie flat bread pieces for slices  of bread when possible - substitute sweet potatoes or yams for white potatoes - substitute humus for margarine - substitute tofu for cheese when possible - substitute almond or rice milk for regular milk (would not drink soy milk daily due to concern for soy estrogen influence on breast cancer risk) - substitute dark chocolate for other sweets when possible - substitute water - can add lemon or orange slices for taste - for diet sodas (artificial sweeteners will trick your  body that you can eat sweets without getting calories and will lead you to overeating and weight gain in the long run) - do not skip breakfast or other meals (this will slow down the metabolism and will result in more weight gain over time)  - can try smoothies made from fruit and almond/rice milk in am instead of regular breakfast - can also try old-fashioned (not instant) oatmeal made with almond/rice milk in am - order the dressing on the side when eating salad at a restaurant (pour less than half of the dressing on the salad) - eat as little meat as possible - can try juicing, but should not forget that juicing will get rid of the fiber, so would alternate with eating raw veg./fruits or drinking smoothies - use as little oil as possible, even when using olive oil - can dress a salad with a mix of balsamic vinegar and lemon juice, for e.g. - use agave nectar, stevia sugar, or regular sugar rather than artificial sweateners - steam or broil/roast veggies  - snack on veggies/fruit/nuts (unsalted, preferably) when possible, rather than processed foods - reduce or eliminate aspartame in diet (it is in diet sodas, chewing gum, etc) Read the labels!  Try to read Dr. Janene Harvey book: "Program for Reversing Diabetes" for other ideas for healthy eating.

## 2020-04-15 NOTE — Progress Notes (Signed)
Remote pacemaker transmission.   

## 2020-04-20 DIAGNOSIS — I480 Paroxysmal atrial fibrillation: Secondary | ICD-10-CM | POA: Diagnosis not present

## 2020-04-20 DIAGNOSIS — E1122 Type 2 diabetes mellitus with diabetic chronic kidney disease: Secondary | ICD-10-CM | POA: Diagnosis not present

## 2020-04-20 DIAGNOSIS — I509 Heart failure, unspecified: Secondary | ICD-10-CM | POA: Diagnosis not present

## 2020-04-20 DIAGNOSIS — N184 Chronic kidney disease, stage 4 (severe): Secondary | ICD-10-CM | POA: Diagnosis not present

## 2020-04-25 DIAGNOSIS — E1122 Type 2 diabetes mellitus with diabetic chronic kidney disease: Secondary | ICD-10-CM | POA: Diagnosis not present

## 2020-04-25 DIAGNOSIS — E211 Secondary hyperparathyroidism, not elsewhere classified: Secondary | ICD-10-CM | POA: Diagnosis not present

## 2020-04-25 DIAGNOSIS — I5032 Chronic diastolic (congestive) heart failure: Secondary | ICD-10-CM | POA: Diagnosis not present

## 2020-04-25 DIAGNOSIS — E1129 Type 2 diabetes mellitus with other diabetic kidney complication: Secondary | ICD-10-CM | POA: Diagnosis not present

## 2020-04-25 DIAGNOSIS — I129 Hypertensive chronic kidney disease with stage 1 through stage 4 chronic kidney disease, or unspecified chronic kidney disease: Secondary | ICD-10-CM | POA: Diagnosis not present

## 2020-04-25 DIAGNOSIS — N189 Chronic kidney disease, unspecified: Secondary | ICD-10-CM | POA: Diagnosis not present

## 2020-04-25 DIAGNOSIS — R809 Proteinuria, unspecified: Secondary | ICD-10-CM | POA: Diagnosis not present

## 2020-04-25 DIAGNOSIS — N170 Acute kidney failure with tubular necrosis: Secondary | ICD-10-CM | POA: Diagnosis not present

## 2020-05-13 ENCOUNTER — Encounter (INDEPENDENT_AMBULATORY_CARE_PROVIDER_SITE_OTHER): Payer: Self-pay | Admitting: Ophthalmology

## 2020-05-13 ENCOUNTER — Ambulatory Visit (INDEPENDENT_AMBULATORY_CARE_PROVIDER_SITE_OTHER): Payer: PPO | Admitting: Ophthalmology

## 2020-05-13 ENCOUNTER — Other Ambulatory Visit: Payer: Self-pay

## 2020-05-13 DIAGNOSIS — H348122 Central retinal vein occlusion, left eye, stable: Secondary | ICD-10-CM

## 2020-05-13 DIAGNOSIS — H34831 Tributary (branch) retinal vein occlusion, right eye, with macular edema: Secondary | ICD-10-CM

## 2020-05-13 HISTORY — DX: Central retinal vein occlusion, left eye, stable: H34.8122

## 2020-05-13 MED ORDER — BEVACIZUMAB CHEMO INJECTION 1.25MG/0.05ML SYRINGE FOR KALEIDOSCOPE
1.2500 mg | INTRAVITREAL | Status: AC | PRN
  Administered 2020-05-13: 1.25 mg via INTRAVITREAL

## 2020-05-13 NOTE — Progress Notes (Signed)
05/13/2020     CHIEF COMPLAINT Patient presents for Retina Follow Up   HISTORY OF PRESENT ILLNESS: Virginia Huber is a 74 y.o. female who presents to the clinic today for:   HPI    Retina Follow Up    Patient presents with  CRVO/BRVO.  In right eye.  Severity is moderate.  Duration of 5 weeks.  Since onset it is stable.  I, the attending physician,  performed the HPI with the patient and updated documentation appropriately.          Comments    5 Week BRVO f\u OD. Possible Avastin OD. OCT  Pt states vision is about the same. Denies complaints. BGL: 126       Last edited by Tilda Franco on 05/13/2020  1:33 PM. (History)      Referring physician: Sharilyn Sites, MD [address] [place],  [place] [address]  HISTORICAL INFORMATION:   Selected notes from the MEDICAL RECORD NUMBER    Lab Results  Component Value Date   HGBA1C 7.3 (A) 04/15/2020     CURRENT MEDICATIONS: No current outpatient medications on file. (Ophthalmic Drugs)   No current facility-administered medications for this visit. (Ophthalmic Drugs)   Current Outpatient Medications (Other)  Medication Sig  . acetaminophen (TYLENOL) 325 MG tablet Take 650 mg by mouth every 6 (six) hours as needed for headache.  [name] amiodarone (PACERONE) 200 MG tablet Take 0.5 tablets (100 mg total) by mouth daily.  [name] apixaban (ELIQUIS) 5 MG TABS tablet Take 1 tablet (5 mg total) by mouth 2 (two) times daily.  [name] atorvastatin (LIPITOR) 80 MG tablet TAKE ONE TABLET BY MOUTH EVERY DAY AT 6:00PM  . carvedilol (COREG) 25 MG tablet Take 1 tablet (25 mg total) by mouth 2 (two) times daily.  . Cholecalciferol (VITAMIN D3 PO) Take 1 tablet by mouth daily.   . clopidogrel (PLAVIX) 75 MG tablet TAKE ONE TABLET (75MG  TOTAL) BY MOUTH DAILY  . cyanocobalamin (,VITAMIN B-12,) 1000 MCG/ML injection Inject 1,000 mcg into the muscle every 30 (thirty) days. Last injection was around 08/02/19  . diltiazem (CARDIZEM CD) 360 MG 24 hr  capsule Take 1 capsule (360 mg total) by mouth daily.  . Dulaglutide (TRULICITY) 1.5 DU/2.0UR SOPN Inject 1.5 mg into the skin once a week.  [name] FREESTYLE LITE test strip 1 each by Other route 2 (two) times daily.   . furosemide (LASIX) 40 MG tablet TAKE ONE AND HALF TABLET (60MG  TOTAL) BY MOUTH IN THE MORNING AND ONE TABLET IN THE EVENING (40MG  TOTAL)  . hydrALAZINE (APRESOLINE) 50 MG tablet Take 50 mg by mouth 3 (three) times daily.  . insulin NPH-regular Human (NOVOLIN 70/30) (70-30) 100 UNIT/ML injection Inject 40 Units into the skin See admin instructions. Inject 25 units before breakfast and 25 units before supper  - only if pre-meal blood glucose is above 90 mg/dL. (Patient taking differently: Inject 40 Units into the skin See admin instructions. Inject 30 units before breakfast and 30 units before supper  - only if pre-meal blood glucose is above 90 mg/dL.)  . levothyroxine (SYNTHROID) 137 MCG tablet Take 137 mcg by mouth daily before breakfast.  . nitroGLYCERIN (NITROSTAT) 0.4 MG SL tablet Place 1 tablet (0.4 mg total) under the tongue every 5 (five) minutes as needed for chest pain.  [name] olmesartan (BENICAR) 40 MG tablet Take 40 mg by mouth daily.  . potassium chloride SA (KLOR-CON) 20 MEQ tablet TAKE ONE (1) TABLET BY MOUTH EVERY DAY  .  rOPINIRole (REQUIP) 0.5 MG tablet Take 0.5 mg by mouth at bedtime.    No current facility-administered medications for this visit. (Other)      REVIEW OF SYSTEMS: ROS    Positive for: Endocrine   Last edited by Tilda Franco on 05/13/2020  1:33 PM. (History)       ALLERGIES Allergies  Allergen Reactions  . Penicillins Hives    Has patient had a PCN reaction causing immediate rash, facial/tongue/throat swelling, SOB or lightheadedness with hypotension: Yes Has patient had a PCN reaction causing severe rash involving mucus membranes or skin necrosis: No Has patient had a PCN reaction that required hospitalization No Has patient had a PCN  reaction occurring within the last 10 years: No If all of the above answers are "NO", then may proceed with Cephalosporin use.  [name] Percocet [Oxycodone-Acetaminophen] Nausea And Vomiting    PAST MEDICAL HISTORY Past Medical History:  Diagnosis Date  . Arthritis   . Atrial fibrillation and flutter ([place])    a. h/o PAF/flutter during admission in 2013 for PNA. b. PAF during adm for NSTEMI 07/2015, subsequent paroxysms since then.  . B12 deficiency anemia   . Cardiac tamponade 06/2016  . CHF (congestive heart failure) ([place])   . Coronary artery disease 11/30/2014   a. remote MI. b. h/o PTCA with scoring balloon to OM1 11/2014. c. NSTEMI 03/2015 s/p DES to prox-mid Cx. d. NSTEMI 07/2015 s/p scoring balloon/PTCA/DES to dRCA with PAF during that admission  . Cutaneous lupus erythematosus   . GERD (gastroesophageal reflux disease)   . Heart block   . History of blood transfusion 1980's   2nd surgical procedures  . HTN (hypertension)   . Hypercholesteremia   . Hypothyroidism   . Myocardial infarction ([place]) 02/2012  . Ovarian tumor   . PAD (peripheral artery disease) ([place])    a. s/p LE angio 2015; followed by Dr. Fletcher Anon - managed medically.  . Pain with urination 05/08/2015  . Paroxysmal atrial flutter ([place])   . Pericardial effusion    a. 06/2016 after ppm - s/p pericardiocentesis.  . Superficial fungus infection of skin 06/29/2013  . Tachy-brady syndrome ([place])    a. s/p Medtronic PPM 06/2016, c/b lead perf/pericardial effusion.  [name] TIA (transient ischemic attack) 08/2001; ~ 2006  . Type II diabetes mellitus ([place])   . UTI (urinary tract infection) 05/08/2013   Past Surgical History:  Procedure Laterality Date  . ABDOMINAL AORTAGRAM N/A 01/03/2014   Procedure: ABDOMINAL [name];  Surgeon: Wellington Hampshire, MD;  Location: [facility];  Service: Cardiovascular;  Laterality: N/A;  . ABDOMINAL HYSTERECTOMY  1972   "partial"  . APPENDECTOMY  1970's  . CARDIAC CATHETERIZATION  2008   Tiny OM-2 with  90% narrowing. Med tx.  [name] CARDIAC CATHETERIZATION N/A 11/30/2014   Procedure: Left Heart Cath and Coronary Angiography;  Surgeon: Troy Sine, MD; LAD 20%, CFX 50%, OM1 95%, right PLB 30%, LV normal   . CARDIAC CATHETERIZATION N/A 11/30/2014   Procedure: Coronary Balloon Angioplasty;  Surgeon: Troy Sine, MD;  Angiosculpt scoring balloon and PTCA to the OM1 reducing stenosis from 95% to less than 10%  . CARDIAC CATHETERIZATION N/A 04/03/2015   Procedure: Left Heart Cath and Coronary Angiography;  Surgeon: Jolaine Artist, MD; dLAD 50%, CFX 90%, OM1 100%, PLA 15%, LVEDP 13    . CARDIAC CATHETERIZATION N/A 04/03/2015   Procedure: Coronary Stent Intervention;  Surgeon: Sherren Mocha, MD; 3.0x18 mm Xience DES to the CFX    .  CARDIAC CATHETERIZATION N/A 08/02/2015   Procedure: Left Heart Cath and Coronary Angiography;  Surgeon: Troy Sine, MD;  Location: [facility];  Service: Cardiovascular;  Laterality: N/A;  . CARDIAC CATHETERIZATION N/A 08/02/2015   Procedure: Coronary Stent Intervention;  Surgeon: Troy Sine, MD;  Location: [facility] CV [facility];  Service: Cardiovascular;  Laterality: N/A;  . CARDIAC CATHETERIZATION N/A 06/25/2016   Procedure: Pericardiocentesis;  Surgeon: Will Meredith Leeds, MD;  Location: [facility];  Service: Cardiovascular;  Laterality: N/A;  . cardiac stents    . CARDIOVERSION N/A 12/15/2017   Procedure: CARDIOVERSION;  Surgeon: Acie Fredrickson Wonda Cheng, MD;  Location: [facility];  Service: Cardiovascular;  Laterality: N/A;  . CHOLECYSTECTOMY OPEN  1990's  . COLONOSCOPY  2005   Dr. Laural Golden: pancolonic divericula, polyp, path unknown currently  . COLONOSCOPY  2012   Dr. Oneida Alar: Normal TI, scattered diverticula in entire colon, small internal hemorrhoids, normal colon biopsies. Colonoscopy in 5-10 years.   . COLOSTOMY  05/1979  . COLOSTOMY REVERSAL  11/1979  . EP IMPLANTABLE DEVICE N/A 06/25/2016   Procedure: Lead Revision/Repair;  Surgeon: Will Meredith Leeds, MD;  Location: [facility] CV [facility];  Service: Cardiovascular;  Laterality: N/A;  . EP IMPLANTABLE DEVICE N/A 06/25/2016   Procedure: Pacemaker Implant;  Surgeon: Will Meredith Leeds, MD;  Location: [facility];  Service: Cardiovascular;  Laterality: N/A;  . EXCISIONAL HEMORRHOIDECTOMY  1970's  . EYE SURGERY Left 2000   "branch vein occlusion"  . EYE SURGERY Left ~ 2001   "smoothed out wrinkle"  . LEFT OOPHORECTOMY  05/1979   nicked bowel, peritonitis, colostomy; colostomy reversed 1981   . LOWER EXTREMITY ANGIOGRAM N/A 01/03/2014   Procedure: LOWER EXTREMITY ANGIOGRAM;  Surgeon: Wellington Hampshire, MD;  Location: [facility] CATH LAB;  Service: Cardiovascular;  Laterality: N/A;  . Nuclear med stress test  10/2011   Small area of mild ischemia inferoapically.  [name] PARTIAL HYSTERECTOMY  1970's   left ovaries, then ovaries removed later due tumors   . RIGHT OOPHORECTOMY  1970's    FAMILY HISTORY Family History  Problem Relation Age of Onset  . Heart disease Mother        deceased  . Heart disease Father        deceased, heart disease  . Diabetes Brother   . Heart disease Brother   . Thyroid disease Brother   . Heart disease Sister   . Heart disease Brother   . Thyroid disease Brother   . Lupus Daughter   . Colon cancer Neg Hx     SOCIAL HISTORY Social History   Tobacco Use  . Smoking status: Never Smoker  . Smokeless tobacco: Never Used  . Tobacco comment: Never smoked  Vaping Use  . Vaping Use: Never used  Substance Use Topics  . Alcohol use: No    Alcohol/week: 0.0 standard drinks  . Drug use: No         OPHTHALMIC EXAM:  Base Eye Exam    Visual Acuity (Snellen - Linear)      Right Left   Dist cc 20/40 CF @ 1'   Dist ph cc NI NI   Correction: Glasses       Tonometry (Tonopen, 1:37 PM)      Right Left   Pressure 13 13       Pupils      Pupils Dark Light Shape React APD   Right PERRL 4 3 Round Brisk None  Left PERRL 4 4 Round Sluggish +1        Visual Fields (Counting fingers)      Left Right     Full   Restrictions Total superior temporal, inferior temporal, inferior nasal deficiencies        Neuro/Psych    Oriented x3: Yes   Mood/Affect: Normal       Dilation    Right eye: 1.0% Mydriacyl, 2.5% Phenylephrine @ 1:37 PM        Slit Lamp and Fundus Exam    External Exam      Right Left   External Normal Normal       Slit Lamp Exam      Right Left   Lids/Lashes Normal Normal   Conjunctiva/Sclera White and quiet White and quiet   Cornea Clear Clear   Anterior Chamber Deep and quiet Deep and quiet   Iris Round and reactive Round and reactive   Lens Posterior chamber intraocular lens Posterior chamber intraocular lens   Anterior Vitreous Normal Normal       Fundus Exam      Right Left   Posterior Vitreous Posterior vitreous detachment, Central vitreous floaters Normal   Disc Normal Pallor   C/D Ratio 0.2 0.85   Macula Hard drusen,    No Cystoid macular edema, Microaneurysms, Retinal pigment epithelial mottling, no hemorrhage, no exudates Diffuse macular atrophy   Vessels Old branch retinal vein occlusion Old BRVO   Periphery Normal Normal          IMAGING AND PROCEDURES  Imaging and Procedures for 05/13/20  OCT, Retina - OU - Both Eyes       Right Eye Quality was good. Scan locations included subfoveal. Central Foveal Thickness: 217. Progression has been stable. Findings include abnormal foveal contour.   Left Eye Quality was good. Scan locations included subfoveal. Central Foveal Thickness: 211. Progression has been stable. Findings include abnormal foveal contour, central retinal atrophy, outer retinal atrophy, inner retinal atrophy.   Notes OD with much less CME as compared to last visit some 5 weeks prior, will repeat intravitreal Avastin today and examination in 5 to 6 weeks       Intravitreal Injection, Pharmacologic Agent - OD - Right Eye       Time Out 05/13/2020. 2:18 PM. Confirmed  correct patient, procedure, site, and patient consented.   Anesthesia Topical anesthesia was used. Anesthetic medications included Akten 3.5%.   Procedure Preparation included Ofloxacin , 10% betadine to eyelids, 5% betadine to ocular surface, Tobramycin 0.3%. A 30 gauge needle was used.   Injection:  1.25 mg Bevacizumab (AVASTIN) SOLN   NDC: [address]-6, Lot: [id number]   Route: Intravitreal, Site: Right Eye, Waste: 0 mg  Post-op Post injection exam found visual acuity of at least counting fingers. The patient tolerated the procedure well. There were no complications. The patient received written and verbal post procedure care education.                 ASSESSMENT/PLAN:  Branch retinal vein occlusion with macular edema of right eye CME OD much improved post Avastin, will repeat today and examination in 5 to 6 weeks  Stable central retinal vein occlusion of left eye Diffuse macular atrophy remains as a consequence of prior retinal vein occlusion      ICD-10-CM   1. Branch retinal vein occlusion with macular edema of right eye  H34.8310 OCT, Retina - OU - Both Eyes    Intravitreal Injection, Pharmacologic Agent -  OD - Right Eye    Bevacizumab (AVASTIN) SOLN 1.25 mg  2. Stable central retinal vein occlusion of left eye  H34.8122     1.  We will continue to treat the right eye for recurrent CME now improved post intravitreal Avastin at 5-week interval.  2.  Dilate OD next  3.  Ophthalmic Meds Ordered this visit:  Meds ordered this encounter  Medications  . Bevacizumab (AVASTIN) SOLN 1.25 mg       Return in about 6 weeks (around 06/24/2020) for dilate, OD, AVASTIN OCT.  There are no Patient Instructions on file for this visit.   Explained the diagnoses, plan, and follow up with the patient and they expressed understanding.  Patient expressed understanding of the importance of proper follow up care.   Clent Demark Zyah Gomm M.D. Diseases & Surgery of the Retina and  Vitreous Retina & Diabetic [place] 05/13/20     Abbreviations: M myopia (nearsighted); A astigmatism; H hyperopia (farsighted); P presbyopia; Mrx spectacle prescription;  CTL contact lenses; OD right eye; OS left eye; OU both eyes  XT exotropia; ET esotropia; PEK punctate epithelial keratitis; PEE punctate epithelial erosions; DES dry eye syndrome; MGD meibomian gland dysfunction; ATs artificial tears; PFAT's preservative free artificial tears; [place] nuclear sclerotic cataract; PSC posterior subcapsular cataract; ERM epi-retinal membrane; PVD posterior vitreous detachment; RD retinal detachment; DM diabetes mellitus; DR diabetic retinopathy; NPDR non-proliferative diabetic retinopathy; PDR proliferative diabetic retinopathy; CSME clinically significant macular edema; DME diabetic macular edema; dbh dot blot hemorrhages; CWS cotton wool spot; POAG primary open angle glaucoma; C/D cup-to-disc ratio; HVF humphrey visual field; GVF goldmann visual field; OCT optical coherence tomography; IOP intraocular pressure; BRVO Branch retinal vein occlusion; CRVO central retinal vein occlusion; CRAO central retinal artery occlusion; BRAO branch retinal artery occlusion; RT retinal tear; SB scleral buckle; PPV pars plana vitrectomy; VH Vitreous hemorrhage; PRP panretinal laser photocoagulation; IVK intravitreal kenalog; VMT vitreomacular traction; MH Macular hole;  NVD neovascularization of the disc; NVE neovascularization elsewhere; AREDS age related eye disease study; ARMD age related macular degeneration; POAG primary open angle glaucoma; EBMD epithelial/anterior basement membrane dystrophy; ACIOL anterior chamber intraocular lens; IOL intraocular lens; PCIOL posterior chamber intraocular lens; Phaco/IOL phacoemulsification with intraocular lens placement; [place] photorefractive keratectomy; LASIK laser assisted in situ keratomileusis; HTN hypertension; DM diabetes mellitus; COPD chronic obstructive pulmonary disease

## 2020-05-13 NOTE — Assessment & Plan Note (Signed)
Diffuse macular atrophy remains as a consequence of prior retinal vein occlusion

## 2020-05-13 NOTE — Assessment & Plan Note (Signed)
CME OD much improved post Avastin, will repeat today and examination in 5 to 6 weeks

## 2020-05-14 DIAGNOSIS — N170 Acute kidney failure with tubular necrosis: Secondary | ICD-10-CM | POA: Diagnosis not present

## 2020-05-14 DIAGNOSIS — R809 Proteinuria, unspecified: Secondary | ICD-10-CM | POA: Diagnosis not present

## 2020-05-14 DIAGNOSIS — E1122 Type 2 diabetes mellitus with diabetic chronic kidney disease: Secondary | ICD-10-CM | POA: Diagnosis not present

## 2020-05-14 DIAGNOSIS — E211 Secondary hyperparathyroidism, not elsewhere classified: Secondary | ICD-10-CM | POA: Diagnosis not present

## 2020-05-14 DIAGNOSIS — N189 Chronic kidney disease, unspecified: Secondary | ICD-10-CM | POA: Diagnosis not present

## 2020-05-14 DIAGNOSIS — I5032 Chronic diastolic (congestive) heart failure: Secondary | ICD-10-CM | POA: Diagnosis not present

## 2020-05-14 DIAGNOSIS — E1129 Type 2 diabetes mellitus with other diabetic kidney complication: Secondary | ICD-10-CM | POA: Diagnosis not present

## 2020-05-14 DIAGNOSIS — I129 Hypertensive chronic kidney disease with stage 1 through stage 4 chronic kidney disease, or unspecified chronic kidney disease: Secondary | ICD-10-CM | POA: Diagnosis not present

## 2020-05-21 DIAGNOSIS — E1122 Type 2 diabetes mellitus with diabetic chronic kidney disease: Secondary | ICD-10-CM | POA: Diagnosis not present

## 2020-05-21 DIAGNOSIS — I480 Paroxysmal atrial fibrillation: Secondary | ICD-10-CM | POA: Diagnosis not present

## 2020-05-21 DIAGNOSIS — I509 Heart failure, unspecified: Secondary | ICD-10-CM | POA: Diagnosis not present

## 2020-05-21 DIAGNOSIS — N184 Chronic kidney disease, stage 4 (severe): Secondary | ICD-10-CM | POA: Diagnosis not present

## 2020-05-22 DIAGNOSIS — N170 Acute kidney failure with tubular necrosis: Secondary | ICD-10-CM | POA: Diagnosis not present

## 2020-05-22 DIAGNOSIS — R809 Proteinuria, unspecified: Secondary | ICD-10-CM | POA: Diagnosis not present

## 2020-05-22 DIAGNOSIS — N189 Chronic kidney disease, unspecified: Secondary | ICD-10-CM | POA: Diagnosis not present

## 2020-05-22 DIAGNOSIS — E1122 Type 2 diabetes mellitus with diabetic chronic kidney disease: Secondary | ICD-10-CM | POA: Diagnosis not present

## 2020-05-22 DIAGNOSIS — E1129 Type 2 diabetes mellitus with other diabetic kidney complication: Secondary | ICD-10-CM | POA: Diagnosis not present

## 2020-05-22 DIAGNOSIS — I5032 Chronic diastolic (congestive) heart failure: Secondary | ICD-10-CM | POA: Diagnosis not present

## 2020-05-22 DIAGNOSIS — I129 Hypertensive chronic kidney disease with stage 1 through stage 4 chronic kidney disease, or unspecified chronic kidney disease: Secondary | ICD-10-CM | POA: Diagnosis not present

## 2020-06-03 ENCOUNTER — Other Ambulatory Visit (HOSPITAL_COMMUNITY): Payer: Self-pay | Admitting: Family Medicine

## 2020-06-03 DIAGNOSIS — Z1231 Encounter for screening mammogram for malignant neoplasm of breast: Secondary | ICD-10-CM

## 2020-06-20 ENCOUNTER — Ambulatory Visit: Payer: PPO | Admitting: Sports Medicine

## 2020-06-21 DIAGNOSIS — I480 Paroxysmal atrial fibrillation: Secondary | ICD-10-CM | POA: Diagnosis not present

## 2020-06-21 DIAGNOSIS — I509 Heart failure, unspecified: Secondary | ICD-10-CM | POA: Diagnosis not present

## 2020-06-21 DIAGNOSIS — E1122 Type 2 diabetes mellitus with diabetic chronic kidney disease: Secondary | ICD-10-CM | POA: Diagnosis not present

## 2020-06-21 DIAGNOSIS — N184 Chronic kidney disease, stage 4 (severe): Secondary | ICD-10-CM | POA: Diagnosis not present

## 2020-06-22 ENCOUNTER — Encounter: Payer: Self-pay | Admitting: Internal Medicine

## 2020-06-24 ENCOUNTER — Other Ambulatory Visit: Payer: Self-pay | Admitting: Internal Medicine

## 2020-06-24 ENCOUNTER — Encounter (INDEPENDENT_AMBULATORY_CARE_PROVIDER_SITE_OTHER): Payer: HMO | Admitting: Ophthalmology

## 2020-06-24 DIAGNOSIS — I5032 Chronic diastolic (congestive) heart failure: Secondary | ICD-10-CM | POA: Diagnosis not present

## 2020-06-24 DIAGNOSIS — N189 Chronic kidney disease, unspecified: Secondary | ICD-10-CM | POA: Diagnosis not present

## 2020-06-24 DIAGNOSIS — E1129 Type 2 diabetes mellitus with other diabetic kidney complication: Secondary | ICD-10-CM | POA: Diagnosis not present

## 2020-06-24 DIAGNOSIS — R809 Proteinuria, unspecified: Secondary | ICD-10-CM | POA: Diagnosis not present

## 2020-06-24 DIAGNOSIS — E1122 Type 2 diabetes mellitus with diabetic chronic kidney disease: Secondary | ICD-10-CM | POA: Diagnosis not present

## 2020-06-24 DIAGNOSIS — N170 Acute kidney failure with tubular necrosis: Secondary | ICD-10-CM | POA: Diagnosis not present

## 2020-06-24 MED ORDER — FREESTYLE LIBRE 2 READER DEVI: 1.0000 | Freq: Every day | 0 refills | Status: AC

## 2020-06-24 MED ORDER — INSULIN NPH ISOPHANE & REGULAR (70-30) 100 UNIT/ML ~~LOC~~ SUSP: SUBCUTANEOUS | 3 refills | Status: DC

## 2020-06-24 MED ORDER — FREESTYLE LIBRE 2 SENSOR MISC: 1.0000 | 3 refills | Status: DC

## 2020-06-24 MED ORDER — TRULICITY 1.5 MG/0.5ML ~~LOC~~ SOAJ: 1.5000 mg | SUBCUTANEOUS | 3 refills | Status: DC

## 2020-06-25 DIAGNOSIS — E1122 Type 2 diabetes mellitus with diabetic chronic kidney disease: Secondary | ICD-10-CM | POA: Diagnosis not present

## 2020-06-25 DIAGNOSIS — I5032 Chronic diastolic (congestive) heart failure: Secondary | ICD-10-CM | POA: Diagnosis not present

## 2020-06-25 DIAGNOSIS — N170 Acute kidney failure with tubular necrosis: Secondary | ICD-10-CM | POA: Diagnosis not present

## 2020-06-25 DIAGNOSIS — E1129 Type 2 diabetes mellitus with other diabetic kidney complication: Secondary | ICD-10-CM | POA: Diagnosis not present

## 2020-06-25 DIAGNOSIS — I129 Hypertensive chronic kidney disease with stage 1 through stage 4 chronic kidney disease, or unspecified chronic kidney disease: Secondary | ICD-10-CM | POA: Diagnosis not present

## 2020-06-25 DIAGNOSIS — R809 Proteinuria, unspecified: Secondary | ICD-10-CM | POA: Diagnosis not present

## 2020-06-25 DIAGNOSIS — N189 Chronic kidney disease, unspecified: Secondary | ICD-10-CM | POA: Diagnosis not present

## 2020-06-26 ENCOUNTER — Ambulatory Visit (INDEPENDENT_AMBULATORY_CARE_PROVIDER_SITE_OTHER): Payer: HMO | Admitting: Ophthalmology

## 2020-06-26 ENCOUNTER — Other Ambulatory Visit: Payer: Self-pay

## 2020-06-26 ENCOUNTER — Encounter (INDEPENDENT_AMBULATORY_CARE_PROVIDER_SITE_OTHER): Payer: Self-pay | Admitting: Ophthalmology

## 2020-06-26 DIAGNOSIS — H34831 Tributary (branch) retinal vein occlusion, right eye, with macular edema: Secondary | ICD-10-CM

## 2020-06-26 DIAGNOSIS — H353124 Nonexudative age-related macular degeneration, left eye, advanced atrophic with subfoveal involvement: Secondary | ICD-10-CM

## 2020-06-26 HISTORY — DX: Nonexudative age-related macular degeneration, left eye, advanced atrophic with subfoveal involvement: H35.3124

## 2020-06-26 MED ORDER — BEVACIZUMAB 2.5 MG/0.1ML IZ SOSY
2.5000 mg | PREFILLED_SYRINGE | INTRAVITREAL | Status: AC | PRN
  Administered 2020-06-26: 2.5 mg via INTRAVITREAL

## 2020-06-26 NOTE — Assessment & Plan Note (Signed)
No interval change over the last 6 weeks.  Dilate OU next

## 2020-06-26 NOTE — Assessment & Plan Note (Signed)
The nature of branch retinal vein occlusion with macular edema was discussed.  The patient was given access to printed information.  The treatment options including continued observation looking for spontaneous resolution versus grid laser versus intravitreal Kenalog injection were discussed.  PRIMARY THERAPY CONSISTS of Anti-VEGF Therapies, AVASTIN, LUCENTIS AND EYLEA.  Their usage was discussed to assist in halting the progression of Macular Edema, in order to preserve, protect or improve acuity.  Additionally, at times, limited focal laser therapy is used in the management.  The risks and benefits of all these options were discussed with the patient.  The patient's questions were answered.  OD, chronic recurrences, currently stabilized on 6-week follow-up and injection intravitreal Avastin.

## 2020-06-26 NOTE — Progress Notes (Signed)
06/26/2020     CHIEF COMPLAINT Patient presents for Retina Follow Up (6 Week F/U OD, poss Avastin OD//Pt denies noticeable changes to [place] OU since last visit. Pt denies ocular pain, flashes of light, or floaters OU. //)   HISTORY OF PRESENT ILLNESS: Virginia Huber is a 75 y.o. female who presents to the clinic today for:   HPI    Retina Follow Up    Patient presents with  CRVO/BRVO.  In right eye.  This started 6 weeks ago.  Severity is mild.  Duration of 6 weeks.  Since onset it is stable. Additional comments: 6 Week F/U OD, poss Avastin OD  Pt denies noticeable changes to [place] OU since last visit. Pt denies ocular pain, flashes of light, or floaters OU.          Last edited by Rockie Neighbours, [place] on 06/26/2020  9:43 AM. (History)      Referring physician: Sharilyn Sites, MD [address] [place],  [place] [address]  HISTORICAL INFORMATION:   Selected notes from the MEDICAL RECORD NUMBER    Lab Results  Component Value Date   HGBA1C 7.3 (A) 04/15/2020     CURRENT MEDICATIONS: No current outpatient medications on file. (Ophthalmic Drugs)   No current facility-administered medications for this visit. (Ophthalmic Drugs)   Current Outpatient Medications (Other)  Medication Sig  . acetaminophen (TYLENOL) 325 MG tablet Take 650 mg by mouth every 6 (six) hours as needed for headache.  [name] amiodarone (PACERONE) 200 MG tablet Take 0.5 tablets (100 mg total) by mouth daily.  [name] apixaban (ELIQUIS) 5 MG TABS tablet Take 1 tablet (5 mg total) by mouth 2 (two) times daily.  [name] atorvastatin (LIPITOR) 80 MG tablet TAKE ONE TABLET BY MOUTH EVERY DAY AT 6:00PM  . carvedilol (COREG) 25 MG tablet Take 1 tablet (25 mg total) by mouth 2 (two) times daily.  . Cholecalciferol (VITAMIN D3 PO) Take 1 tablet by mouth daily.   . clopidogrel (PLAVIX) 75 MG tablet TAKE ONE TABLET (75MG  TOTAL) BY MOUTH DAILY  . Continuous Blood Gluc Receiver (FREESTYLE LIBRE 2 READER) DEVI 1 each by Does not apply  route daily.  . Continuous Blood Gluc Sensor (FREESTYLE LIBRE 2 SENSOR) MISC 1 each by Does not apply route every 14 (fourteen) days.  . cyanocobalamin (,VITAMIN B-12,) 1000 MCG/ML injection Inject 1,000 mcg into the muscle every 30 (thirty) days. Last injection was around 08/02/19  . diltiazem (CARDIZEM CD) 360 MG 24 hr capsule Take 1 capsule (360 mg total) by mouth daily.  . Dulaglutide (TRULICITY) 1.5 ES/9.2ZR SOPN Inject 1.5 mg into the skin once a week.  [name] FREESTYLE LITE test strip 1 each by Other route 2 (two) times daily.   . furosemide (LASIX) 40 MG tablet TAKE ONE AND HALF TABLET (60MG  TOTAL) BY MOUTH IN THE MORNING AND ONE TABLET IN THE EVENING (40MG  TOTAL)  . hydrALAZINE (APRESOLINE) 50 MG tablet Take 50 mg by mouth 3 (three) times daily.  . insulin NPH-regular Human (70-30) 100 UNIT/ML injection Inject 35 units before breakfast and 35 units before supper  . levothyroxine (SYNTHROID) 137 MCG tablet Take 137 mcg by mouth daily before breakfast.  . nitroGLYCERIN (NITROSTAT) 0.4 MG SL tablet Place 1 tablet (0.4 mg total) under the tongue every 5 (five) minutes as needed for chest pain.  [name] olmesartan (BENICAR) 40 MG tablet Take 40 mg by mouth daily.  . potassium chloride SA (KLOR-CON) 20 MEQ tablet TAKE ONE (1) TABLET BY MOUTH  EVERY DAY  . rOPINIRole (REQUIP) 0.5 MG tablet Take 0.5 mg by mouth at bedtime.    No current facility-administered medications for this visit. (Other)      REVIEW OF SYSTEMS:    ALLERGIES Allergies  Allergen Reactions  . Penicillins Hives    Has patient had a PCN reaction causing immediate rash, facial/tongue/throat swelling, SOB or lightheadedness with hypotension: Yes Has patient had a PCN reaction causing severe rash involving mucus membranes or skin necrosis: No Has patient had a PCN reaction that required hospitalization No Has patient had a PCN reaction occurring within the last 10 years: No If all of the above answers are "NO", then may proceed  with Cephalosporin use.  [name] Percocet [Oxycodone-Acetaminophen] Nausea And Vomiting    PAST MEDICAL HISTORY Past Medical History:  Diagnosis Date  . Arthritis   . Atrial fibrillation and flutter ([place])    a. h/o PAF/flutter during admission in 2013 for PNA. b. PAF during adm for NSTEMI 07/2015, subsequent paroxysms since then.  . B12 deficiency anemia   . Cardiac tamponade 06/2016  . CHF (congestive heart failure) ([place])   . Coronary artery disease 11/30/2014   a. remote MI. b. h/o PTCA with scoring balloon to OM1 11/2014. c. NSTEMI 03/2015 s/p DES to prox-mid Cx. d. NSTEMI 07/2015 s/p scoring balloon/PTCA/DES to dRCA with PAF during that admission  . Cutaneous lupus erythematosus   . GERD (gastroesophageal reflux disease)   . Heart block   . History of blood transfusion 1980's   2nd surgical procedures  . HTN (hypertension)   . Hypercholesteremia   . Hypothyroidism   . Myocardial infarction ([place]) 02/2012  . Ovarian tumor   . PAD (peripheral artery disease) ([place])    a. s/p LE angio 2015; followed by Dr. Fletcher Anon - managed medically.  . Pain with urination 05/08/2015  . Paroxysmal atrial flutter ([place])   . Pericardial effusion    a. 06/2016 after ppm - s/p pericardiocentesis.  . Superficial fungus infection of skin 06/29/2013  . Tachy-brady syndrome ([place])    a. s/p Medtronic PPM 06/2016, c/b lead perf/pericardial effusion.  [name] TIA (transient ischemic attack) 08/2001; ~ 2006  . Type II diabetes mellitus ([place])   . UTI (urinary tract infection) 05/08/2013   Past Surgical History:  Procedure Laterality Date  . ABDOMINAL AORTAGRAM N/A 01/03/2014   Procedure: ABDOMINAL [name];  Surgeon: Wellington Hampshire, MD;  Location: [facility];  Service: Cardiovascular;  Laterality: N/A;  . ABDOMINAL HYSTERECTOMY  1972   "partial"  . APPENDECTOMY  1970's  . CARDIAC CATHETERIZATION  2008   Tiny OM-2 with 90% narrowing. Med tx.  [name] CARDIAC CATHETERIZATION N/A 11/30/2014   Procedure: Left Heart Cath and  Coronary Angiography;  Surgeon: Troy Sine, MD; LAD 20%, CFX 50%, OM1 95%, right PLB 30%, LV normal   . CARDIAC CATHETERIZATION N/A 11/30/2014   Procedure: Coronary Balloon Angioplasty;  Surgeon: Troy Sine, MD;  Angiosculpt scoring balloon and PTCA to the OM1 reducing stenosis from 95% to less than 10%  . CARDIAC CATHETERIZATION N/A 04/03/2015   Procedure: Left Heart Cath and Coronary Angiography;  Surgeon: Jolaine Artist, MD; dLAD 50%, CFX 90%, OM1 100%, PLA 15%, LVEDP 13    . CARDIAC CATHETERIZATION N/A 04/03/2015   Procedure: Coronary Stent Intervention;  Surgeon: Sherren Mocha, MD; 3.0x18 mm Xience DES to the CFX    . CARDIAC CATHETERIZATION N/A 08/02/2015   Procedure: Left Heart Cath and Coronary Angiography;  Surgeon: Troy Sine,  MD;  Location: [facility];  Service: Cardiovascular;  Laterality: N/A;  . CARDIAC CATHETERIZATION N/A 08/02/2015   Procedure: Coronary Stent Intervention;  Surgeon: Troy Sine, MD;  Location: [facility];  Service: Cardiovascular;  Laterality: N/A;  . CARDIAC CATHETERIZATION N/A 06/25/2016   Procedure: Pericardiocentesis;  Surgeon: Will Meredith Leeds, MD;  Location: [facility];  Service: Cardiovascular;  Laterality: N/A;  . cardiac stents    . CARDIOVERSION N/A 12/15/2017   Procedure: CARDIOVERSION;  Surgeon: Acie Fredrickson Wonda Cheng, MD;  Location: [facility] ENDOSCOPY;  Service: Cardiovascular;  Laterality: N/A;  . CHOLECYSTECTOMY OPEN  1990's  . COLONOSCOPY  2005   Dr. Laural Golden: pancolonic divericula, polyp, path unknown currently  . COLONOSCOPY  2012   Dr. Oneida Alar: Normal TI, scattered diverticula in entire colon, small internal hemorrhoids, normal colon biopsies. Colonoscopy in 5-10 years.   . COLOSTOMY  05/1979  . COLOSTOMY REVERSAL  11/1979  . EP IMPLANTABLE DEVICE N/A 06/25/2016   Procedure: Lead Revision/Repair;  Surgeon: Will Meredith Leeds, MD;  Location: [facility];  Service: Cardiovascular;  Laterality: N/A;  . EP  IMPLANTABLE DEVICE N/A 06/25/2016   Procedure: Pacemaker Implant;  Surgeon: Will Meredith Leeds, MD;  Location: [facility] LAB;  Service: Cardiovascular;  Laterality: N/A;  . EXCISIONAL HEMORRHOIDECTOMY  1970's  . EYE SURGERY Left 2000   "branch vein occlusion"  . EYE SURGERY Left ~ 2001   "smoothed out wrinkle"  . LEFT OOPHORECTOMY  05/1979   nicked bowel, peritonitis, colostomy; colostomy reversed 1981   . LOWER EXTREMITY ANGIOGRAM N/A 01/03/2014   Procedure: LOWER EXTREMITY ANGIOGRAM;  Surgeon: Wellington Hampshire, MD;  Location: [facility] CATH LAB;  Service: Cardiovascular;  Laterality: N/A;  . Nuclear med stress test  10/2011   Small area of mild ischemia inferoapically.  [name] PARTIAL HYSTERECTOMY  1970's   left ovaries, then ovaries removed later due tumors   . RIGHT OOPHORECTOMY  1970's    FAMILY HISTORY Family History  Problem Relation Age of Onset  . Heart disease Mother        deceased  . Heart disease Father        deceased, heart disease  . Diabetes Brother   . Heart disease Brother   . Thyroid disease Brother   . Heart disease Sister   . Heart disease Brother   . Thyroid disease Brother   . Lupus Daughter   . Colon cancer Neg Hx     SOCIAL HISTORY Social History   Tobacco Use  . Smoking status: Never Smoker  . Smokeless tobacco: Never Used  . Tobacco comment: Never smoked  Vaping Use  . Vaping Use: Never used  Substance Use Topics  . Alcohol use: No    Alcohol/week: 0.0 standard drinks  . Drug use: No         OPHTHALMIC EXAM: Base Eye Exam    Visual Acuity (ETDRS)      Right Left   Dist cc 20/40 CF @ 1'   Dist ph cc NI NI   Correction: Glasses       Tonometry (Tonopen, 9:43 AM)      Right Left   Pressure 11 11       Pupils      Dark Light Shape React APD   Right 4 3 Round Brisk None   Left 4 3 Round Brisk +1       Visual Fields (Counting fingers)      Left Right  Full   Restrictions Total superior temporal, inferior temporal, inferior  nasal deficiencies        Extraocular Movement      Right Left    Full Full       Neuro/Psych    Oriented x3: Yes   Mood/Affect: Normal       Dilation    Right eye: 1.0% Mydriacyl, 2.5% Phenylephrine @ 9:46 AM        Slit Lamp and Fundus Exam    External Exam      Right Left   External Normal Normal       Slit Lamp Exam      Right Left   Lids/Lashes Normal Normal   Conjunctiva/Sclera White and quiet White and quiet   Cornea Clear Clear   Anterior Chamber Deep and quiet Deep and quiet   Iris Round and reactive Round and reactive   Lens Posterior chamber intraocular lens Posterior chamber intraocular lens   Anterior Vitreous Normal Normal       Fundus Exam      Right Left   Posterior Vitreous Posterior vitreous detachment, Central vitreous floaters    Disc Normal    C/D Ratio 0.25    Macula Hard drusen,    No Cystoid macular edema, Microaneurysms, Retinal pigment epithelial mottling, no hemorrhage, no exudates    Vessels Old branch retinal vein occlusion    Periphery Normal           IMAGING AND PROCEDURES  Imaging and Procedures for 06/26/20  OCT, Retina - OU - Both Eyes       Right Eye Quality was good. Scan locations included subfoveal. Central Foveal Thickness: 221. Progression has been stable. Findings include cystoid macular edema.   Left Eye Quality was good. Central Foveal Thickness: 224. Progression has been stable.   Notes Minor perifoveal CME from BRVO persists, multiple recurrences in this monocular patient, currently at 6-week interval.  Repeat injection Avastin OD today       Intravitreal Injection, Pharmacologic Agent - OD - Right Eye       Time Out 06/26/2020. 10:32 AM. Confirmed correct patient, procedure, site, and patient consented.   Anesthesia Topical anesthesia was used. Anesthetic medications included Akten 3.5%.   Procedure Preparation included Ofloxacin , 10% betadine to eyelids, 5% betadine to ocular surface,  Tobramycin 0.3%. A 30 gauge needle was used.   Injection:  2.5 mg Bevacizumab (AVASTIN) 2.5mg /0.38mL SOSY   NDC: 42353-614-43, Lot: [id number]   Route: Intravitreal, Site: Right Eye  Post-op Post injection exam found visual acuity of at least counting fingers. The patient tolerated the procedure well. There were no complications. The patient received written and verbal post procedure care education.                 ASSESSMENT/PLAN:  Branch retinal vein occlusion with macular edema of right eye The nature of branch retinal vein occlusion with macular edema was discussed.  The patient was given access to printed information.  The treatment options including continued observation looking for spontaneous resolution versus grid laser versus intravitreal Kenalog injection were discussed.  PRIMARY THERAPY CONSISTS of Anti-VEGF Therapies, AVASTIN, LUCENTIS AND EYLEA.  Their usage was discussed to assist in halting the progression of Macular Edema, in order to preserve, protect or improve acuity.  Additionally, at times, limited focal laser therapy is used in the management.  The risks and benefits of all these options were discussed with the patient.  The patient's questions were answered.  OD, chronic recurrences, currently stabilized on 6-week follow-up and injection intravitreal Avastin.  Advanced nonexudative age-related macular degeneration of left eye with subfoveal involvement No interval change over the last 6 weeks.  Dilate OU next      ICD-10-CM   1. Branch retinal vein occlusion with macular edema of right eye  H34.8310 OCT, Retina - OU - Both Eyes    Intravitreal Injection, Pharmacologic Agent - OD - Right Eye    bevacizumab (AVASTIN) SOSY 2.5 mg  2. Advanced nonexudative age-related macular degeneration of left eye with subfoveal involvement  H35.3124     1.  OD with history of multiple recurrences of CME with longer interval follow-up examination beyond 6 weeks.  Repeat  intravitreal Avastin OD today to maintain visual function in this monocular patient.  2.  Dilate OU next  3.  Ophthalmic Meds Ordered this visit:  Meds ordered this encounter  Medications  . bevacizumab (AVASTIN) SOSY 2.5 mg       Return in about 6 weeks (around 08/07/2020) for DILATE OU, AVASTIN OCT, OD.  There are no Patient Instructions on file for this visit.   Explained the diagnoses, plan, and follow up with the patient and they expressed understanding.  Patient expressed understanding of the importance of proper follow up care.   Clent Demark Lelar Farewell M.D. Diseases & Surgery of the Retina and Vitreous Retina [facility] Diabetic [place] 06/26/20     Abbreviations: M myopia (nearsighted); A astigmatism; H hyperopia (farsighted); P presbyopia; Mrx spectacle prescription;  CTL contact lenses; OD right eye; OS left eye; OU both eyes  XT exotropia; ET esotropia; PEK punctate epithelial keratitis; PEE punctate epithelial erosions; DES dry eye syndrome; MGD meibomian gland dysfunction; ATs artificial tears; PFAT's preservative free artificial tears; [place] nuclear sclerotic cataract; PSC posterior subcapsular cataract; ERM epi-retinal membrane; PVD posterior vitreous detachment; RD retinal detachment; DM diabetes mellitus; DR diabetic retinopathy; NPDR non-proliferative diabetic retinopathy; PDR proliferative diabetic retinopathy; CSME clinically significant macular edema; DME diabetic macular edema; dbh dot blot hemorrhages; CWS cotton wool spot; POAG primary open angle glaucoma; C/D cup-to-disc ratio; HVF humphrey visual field; GVF goldmann visual field; OCT optical coherence tomography; IOP intraocular pressure; BRVO Branch retinal vein occlusion; CRVO central retinal vein occlusion; CRAO central retinal artery occlusion; BRAO branch retinal artery occlusion; RT retinal tear; SB scleral buckle; PPV pars plana vitrectomy; VH Vitreous hemorrhage; PRP panretinal laser photocoagulation; IVK intravitreal  kenalog; VMT vitreomacular traction; MH Macular hole;  NVD neovascularization of the disc; NVE neovascularization elsewhere; AREDS age related eye disease study; ARMD age related macular degeneration; POAG primary open angle glaucoma; EBMD epithelial/anterior basement membrane dystrophy; ACIOL anterior chamber intraocular lens; IOL intraocular lens; PCIOL posterior chamber intraocular lens; Phaco/IOL phacoemulsification with intraocular lens placement; [place] photorefractive keratectomy; LASIK laser assisted in situ keratomileusis; HTN hypertension; DM diabetes mellitus; COPD chronic obstructive pulmonary disease

## 2020-06-26 NOTE — Patient Instructions (Signed)
Patient instructed to contact the office promptly for new onset visual acuity declines or distortions 

## 2020-06-27 ENCOUNTER — Encounter: Payer: Self-pay | Admitting: Sports Medicine

## 2020-06-27 ENCOUNTER — Ambulatory Visit (INDEPENDENT_AMBULATORY_CARE_PROVIDER_SITE_OTHER): Payer: HMO | Admitting: Sports Medicine

## 2020-06-27 DIAGNOSIS — M79675 Pain in left toe(s): Secondary | ICD-10-CM | POA: Diagnosis not present

## 2020-06-27 DIAGNOSIS — B351 Tinea unguium: Secondary | ICD-10-CM

## 2020-06-27 DIAGNOSIS — Z7901 Long term (current) use of anticoagulants: Secondary | ICD-10-CM

## 2020-06-27 DIAGNOSIS — M79674 Pain in right toe(s): Secondary | ICD-10-CM | POA: Diagnosis not present

## 2020-06-27 DIAGNOSIS — E119 Type 2 diabetes mellitus without complications: Secondary | ICD-10-CM | POA: Diagnosis not present

## 2020-06-27 NOTE — Progress Notes (Signed)
Subjective: Virginia Huber is a 75 y.o. female patient with history of diabetes who presents to office today complaining of long,mildly painful nails while ambulating in shoes; unable to trim. Admits bleeding at left 1st toenail and soreness at the medial corner. Patient states that the glucose reading this morning was not recorded. Meds still same on anticoagulants. No other issues noted.  PCP this week for blood work.   Patient Active Problem List   Diagnosis Date Noted  . Advanced nonexudative age-related macular degeneration of left eye with subfoveal involvement 06/26/2020  . Stable central retinal vein occlusion of left eye 05/13/2020  . Early stage nonexudative age-related macular degeneration of right eye 02/27/2020  . OSA on CPAP 02/12/2020  . Branch retinal vein occlusion with macular edema of right eye 10/11/2019  . Severe nonproliferative diabetic retinopathy of right eye, with macular edema, associated with type 2 diabetes mellitus ([place]) 10/11/2019  . Right retinal defect 10/11/2019  . Posterior vitreous detachment of right eye 10/11/2019  . Retinal microaneurysm of right eye 10/11/2019  . CHF exacerbation ([place]) 08/25/2019  . Acute exacerbation of CHF (congestive heart failure) ([place]) 08/24/2019  . Acute on chronic diastolic HF (heart failure) ([place]) 12/17/2017  . GERD (gastroesophageal reflux disease) 12/17/2017  . Typical atrial flutter ([place])   . 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 Paroxysmal atrial fibrillation ([place]) 12/08/2017  . 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 Chronic renal failure, stage 3b ([place]) 09/23/2017  . Fever 09/23/2017  . S/P pericardiocentesis 06/28/2016  . Acute blood loss anemia 06/28/2016  . Pericardial effusion 06/26/2016  . Tachy-brady syndrome ([place]) 06/25/2016  . Tamponade   . Bradycardia 06/14/2016  . Junctional bradycardia   . Coronary artery disease involving coronary bypass graft of native  heart with unstable angina pectoris ([place])   . Acute diastolic CHF (congestive heart failure), NYHA class 3 ([place])   . Systolic congestive heart failure ([place]) 05/13/2016  . Multiple and bilateral precerebral artery syndromes 05/13/2016  . OSA (obstructive sleep apnea) 05/13/2016  . Chronic diarrhea 12/25/2015  . Chest pain 08/02/2015  . Atrial fibrillation with rapid ventricular response ([address])   . Pain with urination 05/08/2015  . NSTEMI (non-ST elevated myocardial infarction) ([place]) 04/02/2015  . CAD in native artery 11/30/2014  . 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   . PAD (peripheral artery disease) ([place]) 12/26/2013  . Superficial fungus infection of skin 06/29/2013  . UTI (urinary tract infection) 05/08/2013  . Hypokalemia 03/05/2012  . B12 deficiency anemia 03/02/2012  . Bronchospasm 03/02/2012  . Community acquired bacterial pneumonia 03/01/2012  . Acute respiratory failure with hypoxia ([place]) 03/01/2012  . Type 2 diabetes with nephropathy ([place]) 02/29/2012  . Hypothyroidism 02/28/2012  . RLQ abdominal pain 11/24/2010  . OVERWEIGHT/OBESITY 06/03/2010  . Essential hypertension 06/03/2010  . Mixed hyperlipidemia 12/27/2009  . Palpitations 05/17/2009  . FRACTURE, TOE 12/06/2007   Current Outpatient Medications on File Prior to Visit  Medication Sig Dispense Refill  . acetaminophen (TYLENOL) 325 MG tablet Take 650 mg by mouth every 6 (six) hours as needed for headache.    [name] amiodarone (PACERONE) 200 MG tablet Take 0.5 tablets (100 mg total) by mouth daily. 45 tablet 1  . apixaban (ELIQUIS) 5 MG TABS tablet Take 1 tablet (5 mg total) by mouth 2 (two) times daily. 28 tablet 0  . atorvastatin (LIPITOR) 80 MG tablet TAKE ONE TABLET BY MOUTH EVERY DAY AT 6:00PM  90 tablet 3  . carvedilol (COREG) 25 MG tablet Take 1 tablet (25 mg total) by mouth 2 (two) times daily. 90 tablet 1  . Cholecalciferol (VITAMIN D3 PO) Take 1 tablet by mouth  daily.     . clopidogrel (PLAVIX) 75 MG tablet TAKE ONE TABLET (75MG  TOTAL) BY MOUTH DAILY 90 tablet 3  . Continuous Blood Gluc Receiver (FREESTYLE LIBRE 2 READER) DEVI 1 each by Does not apply route daily. 1 each 0  . Continuous Blood Gluc Sensor (FREESTYLE LIBRE 2 SENSOR) MISC 1 each by Does not apply route every 14 (fourteen) days. 6 each 3  . cyanocobalamin (,VITAMIN B-12,) 1000 MCG/ML injection Inject 1,000 mcg into the muscle every 30 (thirty) days. Last injection was around 08/02/19    . diltiazem (CARDIZEM CD) 360 MG 24 hr capsule Take 1 capsule (360 mg total) by mouth daily. 90 capsule 3  . Dulaglutide (TRULICITY) 1.5 TG/2.5WL SOPN Inject 1.5 mg into the skin once a week. 6 mL 3  . FREESTYLE LITE test strip 1 each by Other route 2 (two) times daily.     . furosemide (LASIX) 40 MG tablet TAKE ONE AND HALF TABLET (60MG  TOTAL) BY MOUTH IN THE MORNING AND ONE TABLET IN THE EVENING (40MG  TOTAL) 180 tablet 4  . hydrALAZINE (APRESOLINE) 50 MG tablet Take 50 mg by mouth 3 (three) times daily.    . insulin NPH-regular Human (70-30) 100 UNIT/ML injection Inject 35 units before breakfast and 35 units before supper 45 mL 3  . levothyroxine (SYNTHROID) 137 MCG tablet Take 137 mcg by mouth daily before breakfast.    . nitroGLYCERIN (NITROSTAT) 0.4 MG SL tablet Place 1 tablet (0.4 mg total) under the tongue every 5 (five) minutes as needed for chest pain. 25 tablet 0  . olmesartan (BENICAR) 40 MG tablet Take 40 mg by mouth daily.    . potassium chloride SA (KLOR-CON) 20 MEQ tablet TAKE ONE (1) TABLET BY MOUTH EVERY DAY 90 tablet 1  . rOPINIRole (REQUIP) 0.5 MG tablet Take 0.5 mg by mouth at bedtime.   0   No current facility-administered medications on file prior to visit.   Allergies  Allergen Reactions  . Penicillins Hives    Has patient had a PCN reaction causing immediate rash, facial/tongue/throat swelling, SOB or lightheadedness with hypotension: Yes Has patient had a PCN reaction causing  severe rash involving mucus membranes or skin necrosis: No Has patient had a PCN reaction that required hospitalization No Has patient had a PCN reaction occurring within the last 10 years: No If all of the above answers are "NO", then may proceed with Cephalosporin use.  [name] Percocet [Oxycodone-Acetaminophen] Nausea And Vomiting     Objective: General: Patient is awake, alert, and oriented x 3 and in no acute distress.  Integument: Skin is warm, dry and supple bilateral. Nails are tender, long, thickened and dystrophic with subungual debris, consistent with onychomycosis, 1-5 bilateral. Mild curvature of bilateral hallux nails with dry blood noted to medial left hallux, No signs of infection. No open lesions or preulcerative lesions present bilateral. Remaining integument unremarkable.  Vasculature:  Dorsalis Pedis pulse 1/4 bilateral. Posterior Tibial pulse  0/4 bilateral.  Capillary fill time <3 sec 1-5 bilateral. Positive hair growth to the level of the digits. Temperature gradient within normal limits. No varicosities present bilateral. Trace edema present bilateral.   Neurology: Gross sensation present via light touch bilateral.  Protective sensation intact with Semmes Weinstein monofilament bilateral.  Musculoskeletal: Asymptomatic bunion and  hammertoe with cross over R>L pedal deformities noted bilateral.  Strength within normal limits.  No other acute findings or tenderness to palpation bilateral.  Assessment and Plan: Problem List Items Addressed This Visit   None   Visit Diagnoses    Pain due to onychomycosis of toenails of both feet    -  Primary   Diabetes mellitus without complication ([place])       Anticoagulated         -Examined patient. -Mechanically debrided all nails 1-5 bilateral using sterile nail nipper and filed with dremel without incident  -Advised epsom and antibiotic cream to left hallux medial margin until soreness has resolved -Advised good supportive shoes  daily for foot type -Answered all patient questions -Patient to return  in 3 months for at risk foot care -Patient advised to call the office if any problems or questions arise in the meantime.  Landis Martins, DPM

## 2020-07-01 ENCOUNTER — Other Ambulatory Visit: Payer: Self-pay

## 2020-07-01 ENCOUNTER — Ambulatory Visit (HOSPITAL_COMMUNITY)
Admission: RE | Admit: 2020-07-01 | Discharge: 2020-07-01 | Disposition: A | Discharge status: Home / Self Care | Payer: HMO | Source: Ambulatory Visit | Attending: Family Medicine | Admitting: Family Medicine

## 2020-07-01 DIAGNOSIS — Z1231 Encounter for screening mammogram for malignant neoplasm of breast: Secondary | ICD-10-CM | POA: Insufficient documentation

## 2020-07-09 ENCOUNTER — Ambulatory Visit (INDEPENDENT_AMBULATORY_CARE_PROVIDER_SITE_OTHER): Payer: HMO

## 2020-07-09 DIAGNOSIS — I495 Sick sinus syndrome: Secondary | ICD-10-CM

## 2020-07-09 LAB — CUP PACEART REMOTE DEVICE CHECK
Battery Remaining Longevity: 74 mo
Battery Voltage: 3.01 V
Brady Statistic AP VP Percent: 0.07 %
Brady Statistic AP VS Percent: 86.11 %
Brady Statistic AS VP Percent: 0.04 %
Brady Statistic AS VS Percent: 13.78 %
Brady Statistic RA Percent Paced: 86.13 %
Brady Statistic RV Percent Paced: 0.11 %
Date Time Interrogation Session: 20220118095116
Implantable Lead Implant Date: 20180104
Implantable Lead Implant Date: 20180104
Implantable Lead Location: 753859
Implantable Lead Location: 753860
Implantable Lead Model: 5076
Implantable Lead Model: 5076
Implantable Pulse Generator Implant Date: 20180104
Lead Channel Impedance Value: 323 Ohm
Lead Channel Impedance Value: 380 Ohm
Lead Channel Impedance Value: 399 Ohm
Lead Channel Impedance Value: 475 Ohm
Lead Channel Pacing Threshold Amplitude: 0.625 V
Lead Channel Pacing Threshold Amplitude: 1.25 V
Lead Channel Pacing Threshold Pulse Width: 0.4 ms
Lead Channel Pacing Threshold Pulse Width: 0.4 ms
Lead Channel Sensing Intrinsic Amplitude: 1.625 mV
Lead Channel Sensing Intrinsic Amplitude: 1.625 mV
Lead Channel Sensing Intrinsic Amplitude: 12 mV
Lead Channel Sensing Intrinsic Amplitude: 12 mV
Lead Channel Setting Pacing Amplitude: 2 V
Lead Channel Setting Pacing Amplitude: 2.5 V
Lead Channel Setting Pacing Pulse Width: 0.4 ms
Lead Channel Setting Sensing Sensitivity: 2.8 mV

## 2020-07-12 ENCOUNTER — Ambulatory Visit (HOSPITAL_COMMUNITY): Payer: HMO

## 2020-07-17 DIAGNOSIS — E7849 Other hyperlipidemia: Secondary | ICD-10-CM | POA: Diagnosis not present

## 2020-07-17 DIAGNOSIS — Z6828 Body mass index (BMI) 28.0-28.9, adult: Secondary | ICD-10-CM | POA: Diagnosis not present

## 2020-07-17 DIAGNOSIS — Z1331 Encounter for screening for depression: Secondary | ICD-10-CM | POA: Diagnosis not present

## 2020-07-17 DIAGNOSIS — D649 Anemia, unspecified: Secondary | ICD-10-CM | POA: Diagnosis not present

## 2020-07-17 DIAGNOSIS — R300 Dysuria: Secondary | ICD-10-CM | POA: Diagnosis not present

## 2020-07-17 DIAGNOSIS — Z1389 Encounter for screening for other disorder: Secondary | ICD-10-CM | POA: Diagnosis not present

## 2020-07-17 DIAGNOSIS — E119 Type 2 diabetes mellitus without complications: Secondary | ICD-10-CM | POA: Diagnosis not present

## 2020-07-17 DIAGNOSIS — Z0001 Encounter for general adult medical examination with abnormal findings: Secondary | ICD-10-CM | POA: Diagnosis not present

## 2020-07-19 ENCOUNTER — Other Ambulatory Visit: Payer: Self-pay

## 2020-07-23 ENCOUNTER — Ambulatory Visit (INDEPENDENT_AMBULATORY_CARE_PROVIDER_SITE_OTHER): Payer: HMO | Admitting: Internal Medicine

## 2020-07-23 ENCOUNTER — Other Ambulatory Visit: Payer: Self-pay

## 2020-07-23 ENCOUNTER — Encounter: Payer: Self-pay | Admitting: Internal Medicine

## 2020-07-23 VITALS — BP 138/82 | HR 77 | Ht 63.0 in | Wt 153.6 lb

## 2020-07-23 DIAGNOSIS — E1159 Type 2 diabetes mellitus with other circulatory complications: Secondary | ICD-10-CM

## 2020-07-23 DIAGNOSIS — E1165 Type 2 diabetes mellitus with hyperglycemia: Secondary | ICD-10-CM | POA: Diagnosis not present

## 2020-07-23 LAB — POCT GLYCOSYLATED HEMOGLOBIN (HGB A1C): Hemoglobin A1C: 6.7 % — AB (ref 4.0–5.6)

## 2020-07-23 NOTE — Patient Instructions (Signed)
Please continue: - NPH/R 70/30 28-30 units 2x a day 30 min before brunch and dinner - Trulicity 1.5 mg weekly  Please return in 3 months with your sugar log.

## 2020-07-23 NOTE — Addendum Note (Signed)
Addended by: Lauralyn Primes on: 07/23/2020 10:58 AM   Modules accepted: Orders

## 2020-07-23 NOTE — Progress Notes (Addendum)
Patient ID: Virginia Huber, female   DOB: 24-May-1946, 75 y.o.   MRN: [id number]   This visit occurred during the SARS-CoV-2 public health emergency.  Safety protocols were in place, including screening questions prior to the visit, additional usage of staff PPE, and extensive cleaning of exam room while observing appropriate contact time as indicated for disinfecting solutions.    HPI: Virginia Huber is a 75 y.o.-year-old female, initially referred by her PCP, Dr.Golding, returning for follow-up for DM2, dx in ~2005, insulin-dependent since 2006, uncontrolled, with complications (CAD, s/p NSTEMI, s/p PTCA and DES; CHF; PAF/flutter; PAD; CKD; cerebrovascular disease - s/p TIA; PN).  She previously saw my colleague, Dr. Loanne Drilling.  Our first visit was 3 months ago.  Reviewed HbA1c levels: Lab Results  Component Value Date   HGBA1C 7.3 (A) 04/15/2020   HGBA1C 8.7 (A) 02/14/2020   HGBA1C 7.7 12/04/2019   HGBA1C 7.0 (H) 08/24/2019   HGBA1C 6.8 (H) 12/17/2017   HGBA1C 6.6 (H) 08/05/2017   HGBA1C 6.9 (H) 04/30/2017   HGBA1C 6.9 (H) 01/25/2017   HGBA1C 7.1 (H) 10/22/2016   HGBA1C 6.3 (H) 07/21/2016   Pt is on a regimen of: - NPH/R 70/30 insulin (vial) 40 >> 35-36 units twice a day >> 30 units twice a day 30 minutes before brunch and dinner - Trulicity 2.22 mg weekly - started 01/2020 >> 1.5 mg weekly - increased 03/2020 - tolerates it well No FH of MTC or personal h/o pancreatitis. She was on Metformin >> stopped b/c CKD.  Pt checks her sugars twice a day: - am: 96-166, 180 >>78,  92-208 (in last 2 weeks: 92-139) - 2h after b'fast: n/c - before lunch: n/c - 2h after lunch: n/c >> 96 - before dinner: 76, 84-154, 182 >> 87-177 (last 2 weeks: 87-132) - 2h after dinner: n/c - bedtime: n/c >> 66, 72, 116, 138 - nighttime: n/c Lowest sugar was 60s (after dinner) >> 66 (before decreasing insulin dose); she has hypoglycemia awareness in the 70s.  Highest sugar was upper 200s >>  208.  Glucometer: Freestyle lite  Patient starting to improve her meals after her last HbA1c returned: - Brunch: scrambled egg + bacon + rye bread or cheerios + skim milk - Dinner: meat - grilled + veggies - Snacks: seldom - apple Drinks occasional diet drinks  -+ CKD, last BUN/creatinine:  06/24/2020: BUN/creatinine 24/1.7 Lab Results  Component Value Date   BUN 24 (A) 12/04/2019   BUN 29 (H) 08/26/2019   CREATININE 1.5 (A) 12/04/2019   CREATININE 1.52 (H) 08/26/2019  On Benicar 40.  -+ HL; last set of lipids: Lab Results  Component Value Date   CHOL 166 08/05/2017   HDL 43 (L) 08/05/2017   LDLCALC 96 08/05/2017   TRIG 160 (H) 08/05/2017   CHOLHDL 3.9 08/05/2017  On Lipitor 80.  - last eye exam was on 06/26/2020: + DR. Dr Zadie Rhine.  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  - she has numbness and tingling in her feet.  Pt has FH of DM in M and her family.  She also has a history of hypothyroidism, on levothyroxine 137 mcg daily.  TSH levels were reviewed: Lab Results  Component Value Date   TSH 2.410 02/22/2020   TSH 0.424 (L) 08/15/2019   TSH 0.059 (L) 12/17/2017   TSH 0.05 (L) 12/06/2017   TSH 11.574 (H) 09/23/2017   TSH 12.65 (H) 08/05/2017   TSH 4.97 (H) 04/30/2017  TSH 5.64 (H) 01/25/2017   TSH 1.53 10/22/2016   TSH 2.680 09/25/2016   She has an extensive cardiac history.  Besides mentioned in HPI, she also has a history of heart block, tachybradycardia syndrome, pericardial effusion (history of tamponade).  She is on amiodarone, Eliquis, Plavix, Lasix.    ROS: Constitutional: no weight gain/no weight loss, no fatigue, no subjective hyperthermia, no subjective hypothermia Eyes: no blurry vision, no xerophthalmia ENT: no sore throat, no nodules palpated in neck, no dysphagia, no odynophagia, no hoarseness Cardiovascular: no CP/no SOB/no palpitations/no leg swelling Respiratory: no cough/no SOB/no wheezing Gastrointestinal:  no N/no V/no D/no C/no acid reflux Musculoskeletal: no muscle aches/no joint aches Skin: no rashes, no hair loss Neurological: no tremors/no numbness/no tingling/+ dizziness  I reviewed pt's medications, allergies, PMH, social hx, family hx, and changes were documented in the history of present illness. Otherwise, unchanged from my initial visit note.   Past Medical History:  Diagnosis Date   Arthritis    Atrial fibrillation and flutter ([place])    a. h/o PAF/flutter during admission in 2013 for PNA. b. PAF during adm for NSTEMI 07/2015, subsequent paroxysms since then.   B12 deficiency anemia    Cardiac tamponade 06/2016   CHF (congestive heart failure) (HCC)    Coronary artery disease 11/30/2014   a. remote MI. b. h/o PTCA with scoring balloon to OM1 11/2014. c. NSTEMI 03/2015 s/p DES to prox-mid Cx. d. NSTEMI 07/2015 s/p scoring balloon/PTCA/DES to dRCA with PAF during that admission   Cutaneous lupus erythematosus    GERD (gastroesophageal reflux disease)    Heart block    History of blood transfusion 1980's   2nd surgical procedures   HTN (hypertension)    Hypercholesteremia    Hypothyroidism    Myocardial infarction ([place]) 02/2012   Ovarian tumor    PAD (peripheral artery disease) ([place])    a. s/p LE angio 2015; followed by Dr. Fletcher Anon - managed medically.   Pain with urination 05/08/2015   Paroxysmal atrial flutter (HCC)    Pericardial effusion    a. 06/2016 after ppm - s/p pericardiocentesis.   Superficial fungus infection of skin 06/29/2013   Tachy-brady syndrome ([place])    a. s/p Medtronic PPM 06/2016, c/b lead perf/pericardial effusion.   TIA (transient ischemic attack) 08/2001; ~ 2006   Type II diabetes mellitus ([place])    UTI (urinary tract infection) 05/08/2013   Past Surgical History:  Procedure Laterality Date   ABDOMINAL AORTAGRAM N/A 01/03/2014   Procedure: ABDOMINAL [name];  Surgeon: Wellington Hampshire, MD;  Location: [facility];  Service:  Cardiovascular;  Laterality: N/A;   ABDOMINAL HYSTERECTOMY  1972   "partial"   APPENDECTOMY  1970's   CARDIAC CATHETERIZATION  2008   Tiny OM-2 with 90% narrowing. Med tx.   CARDIAC CATHETERIZATION N/A 11/30/2014   Procedure: Left Heart Cath and Coronary Angiography;  Surgeon: Troy Sine, MD; LAD 20%, CFX 50%, OM1 95%, right PLB 30%, LV normal    CARDIAC CATHETERIZATION N/A 11/30/2014   Procedure: Coronary Balloon Angioplasty;  Surgeon: Troy Sine, MD;  Angiosculpt scoring balloon and PTCA to the OM1 reducing stenosis from 95% to less than 10%   CARDIAC CATHETERIZATION N/A 04/03/2015   Procedure: Left Heart Cath and Coronary Angiography;  Surgeon: Jolaine Artist, MD; dLAD 50%, CFX 90%, OM1 100%, PLA 15%, LVEDP 13     CARDIAC CATHETERIZATION N/A 04/03/2015   Procedure: Coronary Stent Intervention;  Surgeon: Sherren Mocha, MD; 3.0x18 mm Xience DES to  the CFX     CARDIAC CATHETERIZATION N/A 08/02/2015   Procedure: Left Heart Cath and Coronary Angiography;  Surgeon: Troy Sine, MD;  Location: [facility];  Service: Cardiovascular;  Laterality: N/A;   CARDIAC CATHETERIZATION N/A 08/02/2015   Procedure: Coronary Stent Intervention;  Surgeon: Troy Sine, MD;  Location: [facility];  Service: Cardiovascular;  Laterality: N/A;   CARDIAC CATHETERIZATION N/A 06/25/2016   Procedure: Pericardiocentesis;  Surgeon: Will Meredith Leeds, MD;  Location: [facility];  Service: Cardiovascular;  Laterality: N/A;   cardiac stents     CARDIOVERSION N/A 12/15/2017   Procedure: CARDIOVERSION;  Surgeon: Acie Fredrickson Wonda Cheng, MD;  Location: [facility];  Service: Cardiovascular;  Laterality: N/A;   CHOLECYSTECTOMY OPEN  1990's   COLONOSCOPY  2005   Dr. Laural Golden: pancolonic divericula, polyp, path unknown currently   COLONOSCOPY  2012   Dr. Oneida Alar: Normal TI, scattered diverticula in entire colon, small internal hemorrhoids, normal colon biopsies. Colonoscopy in 5-10  years.    COLOSTOMY  05/1979   COLOSTOMY REVERSAL  11/1979   EP IMPLANTABLE DEVICE N/A 06/25/2016   Procedure: Lead Revision/Repair;  Surgeon: Will Meredith Leeds, MD;  Location: [facility];  Service: Cardiovascular;  Laterality: N/A;   EP IMPLANTABLE DEVICE N/A 06/25/2016   Procedure: Pacemaker Implant;  Surgeon: Will Meredith Leeds, MD;  Location: [facility];  Service: Cardiovascular;  Laterality: N/A;   EXCISIONAL HEMORRHOIDECTOMY  1970's   EYE SURGERY Left 2000   "branch vein occlusion"   EYE SURGERY Left ~ 2001   "smoothed out wrinkle"   LEFT OOPHORECTOMY  05/1979   nicked bowel, peritonitis, colostomy; colostomy reversed 1981    LOWER EXTREMITY ANGIOGRAM N/A 01/03/2014   Procedure: LOWER EXTREMITY ANGIOGRAM;  Surgeon: Wellington Hampshire, MD;  Location: [facility];  Service: Cardiovascular;  Laterality: N/A;   Nuclear med stress test  10/2011   Small area of mild ischemia inferoapically.   PARTIAL HYSTERECTOMY  1970's   left ovaries, then ovaries removed later due tumors    RIGHT OOPHORECTOMY  1970's   Social History   Socioeconomic History   Marital status: Married    Spouse name: Not on file   Number of children: Not on file   Years of education: Not on file   Highest education level: Not on file  Occupational History   Occupation: Retired    Fish farm manager: RETIRED    Comment: insurance billing  Tobacco Use   Smoking status: Never Smoker   Smokeless tobacco: Never Used   Tobacco comment: Never smoked  Vaping Use   Vaping Use: Never used  Substance and Sexual Activity   Alcohol use: No    Alcohol/week: 0.0 standard drinks   Drug use: No   Sexual activity: Never    Birth control/protection: Surgical    Comment: hyst  Other Topics Concern   Not on file  Social History Narrative   Not on file   Social Determinants of Health   Financial Resource Strain: Not on file  Food Insecurity: Not on file  Transportation Needs: Not on file   Physical Activity: Not on file  Stress: Not on file  Social Connections: Not on file  Intimate Partner Violence: Not on file   Current Outpatient Medications on File Prior to Visit  Medication Sig Dispense Refill   acetaminophen (TYLENOL) 325 MG tablet Take 650 mg by mouth every 6 (six) hours as needed for headache.     amiodarone (  PACERONE) 200 MG tablet Take 0.5 tablets (100 mg total) by mouth daily. 45 tablet 1   apixaban (ELIQUIS) 5 MG TABS tablet Take 1 tablet (5 mg total) by mouth 2 (two) times daily. 28 tablet 0   atorvastatin (LIPITOR) 80 MG tablet TAKE ONE TABLET BY MOUTH EVERY DAY AT 6:00PM 90 tablet 3   carvedilol (COREG) 25 MG tablet Take 1 tablet (25 mg total) by mouth 2 (two) times daily. 90 tablet 1   Cholecalciferol (VITAMIN D3 PO) Take 1 tablet by mouth daily.      clopidogrel (PLAVIX) 75 MG tablet TAKE ONE TABLET (75MG  TOTAL) BY MOUTH DAILY 90 tablet 3   Continuous Blood Gluc Receiver (FREESTYLE LIBRE 2 READER) DEVI 1 each by Does not apply route daily. 1 each 0   Continuous Blood Gluc Sensor (FREESTYLE LIBRE 2 SENSOR) MISC 1 each by Does not apply route every 14 (fourteen) days. 6 each 3   cyanocobalamin (,VITAMIN B-12,) 1000 MCG/ML injection Inject 1,000 mcg into the muscle every 30 (thirty) days. Last injection was around 08/02/19     diltiazem (CARDIZEM CD) 360 MG 24 hr capsule Take 1 capsule (360 mg total) by mouth daily. 90 capsule 3   Dulaglutide (TRULICITY) 1.5 BO/1.7PZ SOPN Inject 1.5 mg into the skin once a week. 6 mL 3   FREESTYLE LITE test strip 1 each by Other route 2 (two) times daily.      furosemide (LASIX) 40 MG tablet TAKE ONE AND HALF TABLET (60MG  TOTAL) BY MOUTH IN THE MORNING AND ONE TABLET IN THE EVENING (40MG  TOTAL) 180 tablet 4   hydrALAZINE (APRESOLINE) 50 MG tablet Take 50 mg by mouth 3 (three) times daily.     insulin NPH-regular Human (70-30) 100 UNIT/ML injection Inject 35 units before breakfast and 35 units before supper 45 mL 3    levothyroxine (SYNTHROID) 137 MCG tablet Take 137 mcg by mouth daily before breakfast.     nitroGLYCERIN (NITROSTAT) 0.4 MG SL tablet Place 1 tablet (0.4 mg total) under the tongue every 5 (five) minutes as needed for chest pain. 25 tablet 0   olmesartan (BENICAR) 40 MG tablet Take 40 mg by mouth daily.     potassium chloride SA (KLOR-CON) 20 MEQ tablet TAKE ONE (1) TABLET BY MOUTH EVERY DAY 90 tablet 1   rOPINIRole (REQUIP) 0.5 MG tablet Take 0.5 mg by mouth at bedtime.   0   No current facility-administered medications on file prior to visit.   Allergies  Allergen Reactions   Penicillins Hives    Has patient had a PCN reaction causing immediate rash, facial/tongue/throat swelling, SOB or lightheadedness with hypotension: Yes Has patient had a PCN reaction causing severe rash involving mucus membranes or skin necrosis: No Has patient had a PCN reaction that required hospitalization No Has patient had a PCN reaction occurring within the last 10 years: No If all of the above answers are "NO", then may proceed with Cephalosporin use.   Percocet [Oxycodone-Acetaminophen] Nausea And Vomiting   Family History  Problem Relation Age of Onset   Heart disease Mother        deceased   Heart disease Father        deceased, heart disease   Diabetes Brother    Heart disease Brother    Thyroid disease Brother    Heart disease Sister    Heart disease Brother    Thyroid disease Brother    Lupus Daughter    Colon cancer Neg Hx  PE: BP 138/82    Pulse 77    Ht 5\' 3"  (1.6 m)    Wt 153 lb 9.6 oz (69.7 kg)    SpO2 98%    BMI 27.21 kg/m  Wt Readings from Last 3 Encounters:  07/23/20 153 lb 9.6 oz (69.7 kg)  04/15/20 158 lb 12.8 oz (72 kg)  02/22/20 164 lb 6.4 oz (74.6 kg)   Constitutional: Slightly overweight, in NAD Eyes: PERRLA, EOMI, no exophthalmos ENT: moist mucous membranes, no thyromegaly, no cervical lymphadenopathy Cardiovascular: RRR, No MRG Respiratory: CTA  B Gastrointestinal: abdomen soft, NT, ND, BS+ Musculoskeletal: no deformities, strength intact in all 4 Skin: moist, warm, no rashes Neurological: no tremor with outstretched hands, DTR normal in all 4  ASSESSMENT: 1. DM2, insulin-dependent, uncontrolled, withcomplications - CAD, s/p NSTEMI 07/2015 and 03/2015, s/p PTCA and DES - CHF - PAF/flutter - PAD - cerebrovascular disease - s/p TIA - CKD  2. HL  PLAN:  1. Patient with longstanding, uncontrolled, type 2 diabetes, on premixed insulin regimen, and also weekly GLP-1 visit increase at last visit.  She has extensive cardiac history, so our target HbA1c for her would be less than 7%.  At last visit, this was 7.3%, improved.  At that time, we decrease her insulin doses and increase her Trulicity.  I also advised her to discuss with her nephrologist whether we could use an SGLT2 inhibitor.  She saw Dr. Theador Hawthorne in 04/2020 and he wanted [place] to hold off using an SGLT2 inhibitor as her kidney function was trending down.  Since then, however, she saw him again on 06/25/2020, but the topic was not addressed again.  Her kidney function at that time was better. -Of note, she tolerates Trulicity well.  At last visit, I gave her samples, since she was in the donut hole and this was expensive. -At today's visit, sugars are much better, almost all at goal, and especially improved in the last 2 weeks.  She even developed slightly low blood sugars in the evening, after which she decreased the dose of premixed insulin.  She is currently using the dose that I recommended the last visit, 30 units twice a day.  She is happy that she also lost 5 pounds since last visit. - I suggested to:  Patient Instructions  Please continue: - NPH/R 70/30 28-30 units 2x a day 30 min before brunch and dinner - Trulicity 1.5 mg weekly  Please return in 3 months with your sugar log.   - we checked her HbA1c: 6.7% (much better) - advised to check sugars at different times of the  day - 2-3x a day, rotating check times - advised for yearly eye exams >> she is UTD - return to clinic in 3 months  2. HL - Reviewed latest lipid panel from 2019: LDL above target, HDL low, triglycerides slightly high: Lab Results  Component Value Date   CHOL 166 08/05/2017   HDL 43 (L) 08/05/2017   LDLCALC 96 08/05/2017   TRIG 160 (H) 08/05/2017   CHOLHDL 3.9 08/05/2017  - Continues Lipitor 80 without side effects. - She is due for a Lipid panel - apparently had this with PCP last week >> will need records - I will send PCP a message  Addendum (2020-07-25):  Received lab results from PCP (2020-07-17): Glucose 161, BUN/creatinine 30/1.9, GFR 25, otherwise normal with the exception of a high alkaline phosphatase at 152 (44-121) Lipids: 121/146/38/58 Urinalysis with small leukocyte esterase  Philemon Kingdom, MD PhD [facility]  Endocrinology

## 2020-07-23 NOTE — Progress Notes (Signed)
Remote pacemaker transmission.   

## 2020-07-29 ENCOUNTER — Ambulatory Visit (HOSPITAL_COMMUNITY): Payer: HMO

## 2020-07-29 ENCOUNTER — Encounter (HOSPITAL_COMMUNITY): Payer: Self-pay

## 2020-08-06 ENCOUNTER — Encounter: Payer: Self-pay | Admitting: Cardiology

## 2020-08-06 NOTE — Progress Notes (Signed)
Cardiology Office Note  Date: 08/07/2020   ID: Virginia Huber, Virginia Huber 17-Oct-1945, MRN [id number]  PCP:  Sharilyn Sites, MD  Cardiologist:  Rozann Lesches, MD Electrophysiologist:  Constance Haw, MD   Chief Complaint  Patient presents with  . Cardiac follow-up    History of Present Illness: Virginia Huber is a 75 y.o. female former patient of Dr. Bronson Ing now presenting to establish follow-up with me.  I reviewed her records and updated the chart.  She was last seen in August 2021 by Ms. Strader PA-C.  She reports only occasional sense of brief palpitations, no recurring exertional chest pain.  She has had some episodes of orthostatic lightheadedness.  No frank syncope.  She follows with Dr. Curt Bears, Medtronic pacemaker in place.  Recent device interrogation in January revealed normal function.  I reviewed her medications which are outlined below.  We discussed reducing dose of hydralazine for now.  She continues to follow with Dr. Theador Hawthorne, most recent creatinine 1.6-1.9 range.  I reviewed her lab work from September 2021, TSH and LFTs were normal at that time.  Her last LDL was 71 on Lipitor.   Past Medical History:  Diagnosis Date  . Arthritis   . Atrial fibrillation and flutter ([place])    a. h/o PAF/flutter during admission in 2013 for PNA. b. PAF during adm for NSTEMI 07/2015, subsequent paroxysms since then.  . B12 deficiency anemia   . Coronary artery disease 11/30/2014   a. remote MI. b. h/o PTCA with scoring balloon to OM1 11/2014. c. NSTEMI 03/2015 s/p DES to prox-mid Cx. d. NSTEMI 07/2015 s/p scoring balloon/PTCA/DES to dRCA with PAF during that admission  . Cutaneous lupus erythematosus   . Essential hypertension   . GERD (gastroesophageal reflux disease)   . History of blood transfusion 1980's   2nd surgical procedures  . Hypercholesteremia   . Hypothyroidism   . Myocardial infarction ([place]) 02/2012  . Ovarian tumor   . PAD (peripheral artery disease) ([place])     a. s/p LE angio 2015; followed by Dr. Fletcher Anon - managed medically.  . Pericardial effusion    a. 06/2016 after ppm - s/p pericardiocentesis.  . Superficial fungus infection of skin 06/29/2013  . Tachy-brady syndrome ([place])    a. s/p Medtronic PPM 06/2016, c/b lead perf/pericardial effusion.  [name] TIA (transient ischemic attack)   . Type II diabetes mellitus ([place])     Past Surgical History:  Procedure Laterality Date  . ABDOMINAL AORTAGRAM N/A 01/03/2014   Procedure: ABDOMINAL [name];  Surgeon: Wellington Hampshire, MD;  Location: [facility];  Service: Cardiovascular;  Laterality: N/A;  . ABDOMINAL HYSTERECTOMY  1972   "partial"  . APPENDECTOMY  1970's  . CARDIAC CATHETERIZATION  2008   Tiny OM-2 with 90% narrowing. Med tx.  [name] CARDIAC CATHETERIZATION N/A 11/30/2014   Procedure: Left Heart Cath and Coronary Angiography;  Surgeon: Troy Sine, MD; LAD 20%, CFX 50%, OM1 95%, right PLB 30%, LV normal   . CARDIAC CATHETERIZATION N/A 11/30/2014   Procedure: Coronary Balloon Angioplasty;  Surgeon: Troy Sine, MD;  Angiosculpt scoring balloon and PTCA to the OM1 reducing stenosis from 95% to less than 10%  . CARDIAC CATHETERIZATION N/A 04/03/2015   Procedure: Left Heart Cath and Coronary Angiography;  Surgeon: Jolaine Artist, MD; dLAD 50%, CFX 90%, OM1 100%, PLA 15%, LVEDP 13    . CARDIAC CATHETERIZATION N/A 04/03/2015   Procedure: Coronary Stent Intervention;  Surgeon: Sherren Mocha, MD; 3.0x18 mm  Xience DES to the CFX    . CARDIAC CATHETERIZATION N/A 08/02/2015   Procedure: Left Heart Cath and Coronary Angiography;  Surgeon: Troy Sine, MD;  Location: [facility];  Service: Cardiovascular;  Laterality: N/A;  . CARDIAC CATHETERIZATION N/A 08/02/2015   Procedure: Coronary Stent Intervention;  Surgeon: Troy Sine, MD;  Location: [facility];  Service: Cardiovascular;  Laterality: N/A;  . CARDIAC CATHETERIZATION N/A 06/25/2016   Procedure: Pericardiocentesis;  Surgeon: Will  Meredith Leeds, MD;  Location: [facility];  Service: Cardiovascular;  Laterality: N/A;  . cardiac stents    . CARDIOVERSION N/A 12/15/2017   Procedure: CARDIOVERSION;  Surgeon: Acie Fredrickson Wonda Cheng, MD;  Location: [facility];  Service: Cardiovascular;  Laterality: N/A;  . CHOLECYSTECTOMY OPEN  1990's  . COLONOSCOPY  2005   Dr. Laural Golden: pancolonic divericula, polyp, path unknown currently  . COLONOSCOPY  2012   Dr. Oneida Alar: Normal TI, scattered diverticula in entire colon, small internal hemorrhoids, normal colon biopsies. Colonoscopy in 5-10 years.   . COLOSTOMY  05/1979  . COLOSTOMY REVERSAL  11/1979  . EP IMPLANTABLE DEVICE N/A 06/25/2016   Procedure: Lead Revision/Repair;  Surgeon: Will Meredith Leeds, MD;  Location: [facility];  Service: Cardiovascular;  Laterality: N/A;  . EP IMPLANTABLE DEVICE N/A 06/25/2016   Procedure: Pacemaker Implant;  Surgeon: Will Meredith Leeds, MD;  Location: [facility];  Service: Cardiovascular;  Laterality: N/A;  . EXCISIONAL HEMORRHOIDECTOMY  1970's  . EYE SURGERY Left 2000   "branch vein occlusion"  . EYE SURGERY Left ~ 2001   "smoothed out wrinkle"  . LEFT OOPHORECTOMY  05/1979   nicked bowel, peritonitis, colostomy; colostomy reversed 1981   . LOWER EXTREMITY ANGIOGRAM N/A 01/03/2014   Procedure: LOWER EXTREMITY ANGIOGRAM;  Surgeon: Wellington Hampshire, MD;  Location: [facility];  Service: Cardiovascular;  Laterality: N/A;  . Nuclear med stress test  10/2011   Small area of mild ischemia inferoapically.  [name] PARTIAL HYSTERECTOMY  1970's   left ovaries, then ovaries removed later due tumors   . RIGHT OOPHORECTOMY  1970's    Current Outpatient Medications  Medication Sig Dispense Refill  . acetaminophen (TYLENOL) 325 MG tablet Take 650 mg by mouth every 6 (six) hours as needed for headache.    [name] amiodarone (PACERONE) 200 MG tablet Take 0.5 tablets (100 mg total) by mouth daily. 45 tablet 1  . apixaban (ELIQUIS) 5 MG TABS tablet Take 1  tablet (5 mg total) by mouth 2 (two) times daily. 28 tablet 0  . atorvastatin (LIPITOR) 80 MG tablet TAKE ONE TABLET BY MOUTH EVERY DAY AT 6:00PM 90 tablet 3  . carvedilol (COREG) 25 MG tablet Take 1 tablet (25 mg total) by mouth 2 (two) times daily. 90 tablet 1  . Cholecalciferol (VITAMIN D3 PO) Take 1 tablet by mouth daily.     . clopidogrel (PLAVIX) 75 MG tablet TAKE ONE TABLET (75MG  TOTAL) BY MOUTH DAILY 90 tablet 3  . Continuous Blood Gluc Receiver (FREESTYLE LIBRE 2 READER) DEVI 1 each by Does not apply route daily. 1 each 0  . Continuous Blood Gluc Sensor (FREESTYLE LIBRE 2 SENSOR) MISC 1 each by Does not apply route every 14 (fourteen) days. 6 each 3  . cyanocobalamin (,VITAMIN B-12,) 1000 MCG/ML injection Inject 1,000 mcg into the muscle every 30 (thirty) days. Last injection was around 08/02/19    . diltiazem (CARDIZEM CD) 360 MG 24 hr capsule Take 1 capsule (360 mg  total) by mouth daily. 90 capsule 3  . Dulaglutide (TRULICITY) 1.5 SP/2.3RA SOPN Inject 1.5 mg into the skin once a week. 6 mL 3  . FREESTYLE LITE test strip 1 each by Other route 2 (two) times daily.     . furosemide (LASIX) 40 MG tablet TAKE ONE AND HALF TABLET (60MG  TOTAL) BY MOUTH IN THE MORNING AND ONE TABLET IN THE EVENING (40MG  TOTAL) 180 tablet 4  . hydrALAZINE (APRESOLINE) 25 MG tablet Take 1 tablet (25 mg total) by mouth in the morning and at bedtime. 180 tablet 3  . insulin NPH-regular Human (70-30) 100 UNIT/ML injection Inject 35 units before breakfast and 35 units before supper 45 mL 3  . levothyroxine (SYNTHROID) 137 MCG tablet Take 137 mcg by mouth daily before breakfast.    . nitroGLYCERIN (NITROSTAT) 0.4 MG SL tablet Place 1 tablet (0.4 mg total) under the tongue every 5 (five) minutes as needed for chest pain. 25 tablet 0  . olmesartan (BENICAR) 40 MG tablet Take 40 mg by mouth daily.    . potassium chloride SA (KLOR-CON) 20 MEQ tablet TAKE ONE (1) TABLET BY MOUTH EVERY DAY 90 tablet 1  . rOPINIRole (REQUIP)  0.5 MG tablet Take 0.5 mg by mouth at bedtime.   0   No current facility-administered medications for this visit.   Allergies:  Penicillins and Percocet [oxycodone-acetaminophen]   ROS: No frank syncope.  No orthopnea or PND.  Physical Exam: VS:  BP 132/84   Pulse 84   Ht 5\' 3"  (1.6 m)   Wt 154 lb 1.6 oz (69.9 kg)   SpO2 98%   BMI 27.30 kg/m , BMI Body mass index is 27.3 kg/m.  Wt Readings from Last 3 Encounters:  08/07/20 154 lb 1.6 oz (69.9 kg)  07/23/20 153 lb 9.6 oz (69.7 kg)  04/15/20 158 lb 12.8 oz (72 kg)    General: Elderly woman, appears comfortable at rest. HEENT: Conjunctiva and lids normal, wearing a mask. Neck: Supple, no elevated JVP or carotid bruits, no thyromegaly. Lungs: Clear to auscultation, nonlabored breathing at rest. Cardiac: Regular rate and rhythm, no S3 or significant systolic murmur, no pericardial rub. Extremities: No pitting edema.  ECG:  An ECG dated 02/22/2020 was personally reviewed today and demonstrated:  Atrial paced rhythm.  Recent Labwork: 08/24/2019: B Natriuretic Peptide 547.0 08/25/2019: Hemoglobin 9.8; Platelets 259 08/26/2019: Potassium 4.2; Sodium 140 12/04/2019: BUN 24; Creatinine 1.5 02/22/2020: ALT 14; AST 12; TSH 2.410  June 2021: Cholesterol 143, triglycerides 137, HDL 48, LDL 71  Other Studies Reviewed Today:  Echocardiogram 08/26/2019: 1. Left ventricular ejection fraction, by estimation, is 55 to 60%. The  left ventricle has normal function. The left ventricle has no regional  wall motion abnormalities. There is moderate concentric left ventricular  hypertrophy. Left ventricular  diastolic function could not be evaluated. Left ventricular diastolic  function could not be evaluated. Elevated left ventricular end-diastolic  pressure.  2. Right ventricular systolic function is normal. The right ventricular  size is normal. There is mildly elevated pulmonary artery systolic  pressure.  3. Left atrial size was severely dilated.   4. The mitral valve is normal in structure and function. Trivial mitral  valve regurgitation. No evidence of mitral stenosis.  5. The aortic valve is tricuspid. Aortic valve regurgitation is not  visualized. No aortic stenosis is present.  6. The inferior vena cava is normal in size with <50% respiratory  variability, suggesting right atrial pressure of 8 mmHg.   Assessment  and Plan:  1.  Paroxysmal atrial fibrillation/flutter.  Symptomatically stable at this time on low-dose amiodarone for rhythm control, also Coreg and Cardizem CD.  CHA2DS2-VASc score is 8.  She remains on Eliquis for stroke prophylaxis.  No spontaneous bleeding problems reported.  2.  CAD as outlined above, most recently status post DES to the distal RCA in 2017.  She underwent previous DES intervention to the proximal and mid circumflex in 2016 and prior to that angioplasty of the OM1.  She continues on Plavix, Coreg, Lipitor, and Benicar.  3.  Essential hypertension.  Does have intermittent orthostatic lightheadedness.  We will cut hydralazine back to 25 mg twice daily.  4.  Mixed hyperlipidemia, continues on Lipitor with most recent LDL 71.  5.  Tachycardia-bradycardia syndrome status post Medtronic pacemaker with follow-up by Dr. Curt Bears.  Medication Adjustments/Labs and Tests Ordered: Current medicines are reviewed at length with the patient today.  Concerns regarding medicines are outlined above.   Tests Ordered: No orders of the defined types were placed in this encounter.   Medication Changes: Meds ordered this encounter  Medications  . hydrALAZINE (APRESOLINE) 25 MG tablet    Sig: Take 1 tablet (25 mg total) by mouth in the morning and at bedtime.    Dispense:  180 tablet    Refill:  3    08/07/20 dose decreased to 25 mg bid    Disposition:  Follow up 6 months in the [place] office.  Signed, Satira Sark, MD, [facility] 08/07/2020 8:51 AM    [place] [facility] [address] [place], [place] [address] Phone: [phone]; Fax: [phone]

## 2020-08-07 ENCOUNTER — Encounter: Payer: Self-pay | Admitting: Cardiology

## 2020-08-07 ENCOUNTER — Ambulatory Visit (INDEPENDENT_AMBULATORY_CARE_PROVIDER_SITE_OTHER): Payer: HMO | Admitting: Cardiology

## 2020-08-07 ENCOUNTER — Other Ambulatory Visit: Payer: Self-pay

## 2020-08-07 VITALS — BP 132/84 | HR 84 | Ht 63.0 in | Wt 154.1 lb

## 2020-08-07 DIAGNOSIS — I480 Paroxysmal atrial fibrillation: Secondary | ICD-10-CM

## 2020-08-07 DIAGNOSIS — I25119 Atherosclerotic heart disease of native coronary artery with unspecified angina pectoris: Secondary | ICD-10-CM

## 2020-08-07 DIAGNOSIS — I495 Sick sinus syndrome: Secondary | ICD-10-CM

## 2020-08-07 DIAGNOSIS — I10 Essential (primary) hypertension: Secondary | ICD-10-CM | POA: Diagnosis not present

## 2020-08-07 MED ORDER — HYDRALAZINE HCL 25 MG PO TABS: 25.0000 mg | ORAL_TABLET | Freq: Two times a day (BID) | ORAL | 3 refills | Status: DC

## 2020-08-07 NOTE — Patient Instructions (Signed)
Medication Instructions:  DECREASE hydralazine to 25 mg twice a day  *If you need a refill on your cardiac medications before your next appointment, please call your pharmacy*   Lab Work: None today If you have labs (blood work) drawn today and your tests are completely normal, you will receive your results only by: [name] MyChart Message (if you have MyChart) OR . A paper copy in the mail If you have any lab test that is abnormal or we need to change your treatment, we will call you to review the results.   Testing/Procedures: None today   Follow-Up: At [facility], you and your health needs are our priority.  As part of our continuing mission to provide you with exceptional heart care, we have created designated Provider Care Teams.  These Care Teams include your primary Cardiologist (physician) and Advanced Practice Providers (APPs -  Physician Assistants and Nurse Practitioners) who all work together to provide you with the care you need, when you need it.  We recommend signing up for the patient portal called "MyChart".  Sign up information is provided on this After Visit Summary.  MyChart is used to connect with patients for Virtual Visits (Telemedicine).  Patients are able to view lab/test results, encounter notes, upcoming appointments, etc.  Non-urgent messages can be sent to your provider as well.   To learn more about what you can do with MyChart, go to [email or web address].    Your next appointment:   6 month(s)  The format for your next appointment:   In Person  Provider:   Rozann Lesches, MD   Other Instructions None      Thank you for choosing [facility] !

## 2020-08-08 ENCOUNTER — Encounter (INDEPENDENT_AMBULATORY_CARE_PROVIDER_SITE_OTHER): Payer: Self-pay | Admitting: Ophthalmology

## 2020-08-08 ENCOUNTER — Ambulatory Visit (INDEPENDENT_AMBULATORY_CARE_PROVIDER_SITE_OTHER): Payer: HMO | Admitting: Ophthalmology

## 2020-08-08 DIAGNOSIS — H353124 Nonexudative age-related macular degeneration, left eye, advanced atrophic with subfoveal involvement: Secondary | ICD-10-CM | POA: Diagnosis not present

## 2020-08-08 DIAGNOSIS — H353111 Nonexudative age-related macular degeneration, right eye, early dry stage: Secondary | ICD-10-CM | POA: Diagnosis not present

## 2020-08-08 DIAGNOSIS — H34831 Tributary (branch) retinal vein occlusion, right eye, with macular edema: Secondary | ICD-10-CM | POA: Diagnosis not present

## 2020-08-08 DIAGNOSIS — H43811 Vitreous degeneration, right eye: Secondary | ICD-10-CM | POA: Diagnosis not present

## 2020-08-08 MED ORDER — BEVACIZUMAB 2.5 MG/0.1ML IZ SOSY
2.5000 mg | PREFILLED_SYRINGE | INTRAVITREAL | Status: AC | PRN
  Administered 2020-08-08: 2.5 mg via INTRAVITREAL

## 2020-08-08 NOTE — Assessment & Plan Note (Signed)
Branch retinal vein occlusion right eye, stable at 6-week interval follow-up post Avastin.  Multiple histories of recurrence in the essentially monocular patient.  Will need repeat injection today at 6-week interval today and evaluation again in 6 weeks

## 2020-08-08 NOTE — Assessment & Plan Note (Signed)
Stable OD °

## 2020-08-08 NOTE — Progress Notes (Signed)
08/08/2020     CHIEF COMPLAINT Patient presents for Retina Follow Up (6 Wk FU OU, POSS AVASTIN OD///Pt reports slight blur OD>OS. Pt also reports black floaters that come and go, and "hazy swirls" OD. Pt denies any flashes, pain, or pressure OU. ////Last A1C: 6.7 taken 2 weeks ago                                   Last BS: 120 today )   HISTORY OF PRESENT ILLNESS: Virginia Huber is a 75 y.o. female who presents to the clinic today for:   HPI    Retina Follow Up    Patient presents with  CRVO/BRVO.  In right eye.  This started 6 weeks ago.  Duration of 6 weeks. Additional comments: 6 Wk FU OU, POSS AVASTIN OD   Pt reports slight blur OD>OS. Pt also reports black floaters that come and go, and "hazy swirls" OD. Pt denies any flashes, pain, or pressure OU.     Last A1C: 6.7 taken 2 weeks ago                                   Last BS: 120 today        Last edited by Nichola Sizer D on 08/08/2020 10:20 AM. (History)      Referring physician: Sharilyn Sites, MD [address] [place],  [place] [address]  HISTORICAL INFORMATION:   Selected notes from the MEDICAL RECORD NUMBER    Lab Results  Component Value Date   HGBA1C 6.7 (A) 07/23/2020     CURRENT MEDICATIONS: No current outpatient medications on file. (Ophthalmic Drugs)   No current facility-administered medications for this visit. (Ophthalmic Drugs)   Current Outpatient Medications (Other)  Medication Sig  . acetaminophen (TYLENOL) 325 MG tablet Take 650 mg by mouth every 6 (six) hours as needed for headache.  [name] amiodarone (PACERONE) 200 MG tablet Take 0.5 tablets (100 mg total) by mouth daily.  [name] apixaban (ELIQUIS) 5 MG TABS tablet Take 1 tablet (5 mg total) by mouth 2 (two) times daily.  [name] atorvastatin (LIPITOR) 80 MG tablet TAKE ONE TABLET BY MOUTH EVERY DAY AT 6:00PM  . carvedilol (COREG) 25 MG tablet Take 1 tablet (25 mg total) by mouth 2 (two) times daily.  . Cholecalciferol (VITAMIN D3 PO) Take 1  tablet by mouth daily.   . clopidogrel (PLAVIX) 75 MG tablet TAKE ONE TABLET (75MG  TOTAL) BY MOUTH DAILY  . Continuous Blood Gluc Receiver (FREESTYLE LIBRE 2 READER) DEVI 1 each by Does not apply route daily.  . Continuous Blood Gluc Sensor (FREESTYLE LIBRE 2 SENSOR) MISC 1 each by Does not apply route every 14 (fourteen) days.  . cyanocobalamin (,VITAMIN B-12,) 1000 MCG/ML injection Inject 1,000 mcg into the muscle every 30 (thirty) days. Last injection was around 08/02/19  . diltiazem (CARDIZEM CD) 360 MG 24 hr capsule Take 1 capsule (360 mg total) by mouth daily.  . Dulaglutide (TRULICITY) 1.5 SP/2.3RA SOPN Inject 1.5 mg into the skin once a week.  [name] FREESTYLE LITE test strip 1 each by Other route 2 (two) times daily.   . furosemide (LASIX) 40 MG tablet TAKE ONE AND HALF TABLET (60MG  TOTAL) BY MOUTH IN THE MORNING AND ONE TABLET IN THE EVENING (40MG  TOTAL)  . hydrALAZINE (APRESOLINE) 25 MG tablet Take 1 tablet (  25 mg total) by mouth in the morning and at bedtime.  . insulin NPH-regular Human (70-30) 100 UNIT/ML injection Inject 35 units before breakfast and 35 units before supper  . levothyroxine (SYNTHROID) 137 MCG tablet Take 137 mcg by mouth daily before breakfast.  . nitroGLYCERIN (NITROSTAT) 0.4 MG SL tablet Place 1 tablet (0.4 mg total) under the tongue every 5 (five) minutes as needed for chest pain.  [name] olmesartan (BENICAR) 40 MG tablet Take 40 mg by mouth daily.  . potassium chloride SA (KLOR-CON) 20 MEQ tablet TAKE ONE (1) TABLET BY MOUTH EVERY DAY  . rOPINIRole (REQUIP) 0.5 MG tablet Take 0.5 mg by mouth at bedtime.    No current facility-administered medications for this visit. (Other)      REVIEW OF SYSTEMS:    ALLERGIES Allergies  Allergen Reactions  . Penicillins Hives    Has patient had a PCN reaction causing immediate rash, facial/tongue/throat swelling, SOB or lightheadedness with hypotension: Yes Has patient had a PCN reaction causing severe rash involving mucus  membranes or skin necrosis: No Has patient had a PCN reaction that required hospitalization No Has patient had a PCN reaction occurring within the last 10 years: No If all of the above answers are "NO", then may proceed with Cephalosporin use.  [name] Percocet [Oxycodone-Acetaminophen] Nausea And Vomiting    PAST MEDICAL HISTORY Past Medical History:  Diagnosis Date  . Arthritis   . Atrial fibrillation and flutter ([place])    a. h/o PAF/flutter during admission in 2013 for PNA. b. PAF during adm for NSTEMI 07/2015, subsequent paroxysms since then.  . B12 deficiency anemia   . Coronary artery disease 11/30/2014   a. remote MI. b. h/o PTCA with scoring balloon to OM1 11/2014. c. NSTEMI 03/2015 s/p DES to prox-mid Cx. d. NSTEMI 07/2015 s/p scoring balloon/PTCA/DES to dRCA with PAF during that admission  . Cutaneous lupus erythematosus   . Essential hypertension   . GERD (gastroesophageal reflux disease)   . History of blood transfusion 1980's   2nd surgical procedures  . Hypercholesteremia   . Hypothyroidism   . Myocardial infarction ([place]) 02/2012  . Ovarian tumor   . PAD (peripheral artery disease) ([place])    a. s/p LE angio 2015; followed by Dr. Fletcher Anon - managed medically.  . Pericardial effusion    a. 06/2016 after ppm - s/p pericardiocentesis.  . Superficial fungus infection of skin 06/29/2013  . Tachy-brady syndrome ([place])    a. s/p Medtronic PPM 06/2016, c/b lead perf/pericardial effusion.  [name] TIA (transient ischemic attack)   . Type II diabetes mellitus ([place])    Past Surgical History:  Procedure Laterality Date  . ABDOMINAL AORTAGRAM N/A 01/03/2014   Procedure: ABDOMINAL [name];  Surgeon: Wellington Hampshire, MD;  Location: [facility] CATH LAB;  Service: Cardiovascular;  Laterality: N/A;  . ABDOMINAL HYSTERECTOMY  1972   "partial"  . APPENDECTOMY  1970's  . CARDIAC CATHETERIZATION  2008   Tiny OM-2 with 90% narrowing. Med tx.  [name] CARDIAC CATHETERIZATION N/A 11/30/2014   Procedure: Left Heart Cath  and Coronary Angiography;  Surgeon: Troy Sine, MD; LAD 20%, CFX 50%, OM1 95%, right PLB 30%, LV normal   . CARDIAC CATHETERIZATION N/A 11/30/2014   Procedure: Coronary Balloon Angioplasty;  Surgeon: Troy Sine, MD;  Angiosculpt scoring balloon and PTCA to the OM1 reducing stenosis from 95% to less than 10%  . CARDIAC CATHETERIZATION N/A 04/03/2015   Procedure: Left Heart Cath and Coronary Angiography;  Surgeon: Shaune Pascal Bensimhon,  MD; dLAD 50%, CFX 90%, OM1 100%, PLA 15%, LVEDP 13    . CARDIAC CATHETERIZATION N/A 04/03/2015   Procedure: Coronary Stent Intervention;  Surgeon: Sherren Mocha, MD; 3.0x18 mm Xience DES to the CFX    . CARDIAC CATHETERIZATION N/A 08/02/2015   Procedure: Left Heart Cath and Coronary Angiography;  Surgeon: Troy Sine, MD;  Location: [facility] LAB;  Service: Cardiovascular;  Laterality: N/A;  . CARDIAC CATHETERIZATION N/A 08/02/2015   Procedure: Coronary Stent Intervention;  Surgeon: Troy Sine, MD;  Location: [facility] CV LAB;  Service: Cardiovascular;  Laterality: N/A;  . CARDIAC CATHETERIZATION N/A 06/25/2016   Procedure: Pericardiocentesis;  Surgeon: Will Meredith Leeds, MD;  Location: [facility] CV LAB;  Service: Cardiovascular;  Laterality: N/A;  . cardiac stents    . CARDIOVERSION N/A 12/15/2017   Procedure: CARDIOVERSION;  Surgeon: Acie Fredrickson Wonda Cheng, MD;  Location: [facility] ENDOSCOPY;  Service: Cardiovascular;  Laterality: N/A;  . CHOLECYSTECTOMY OPEN  1990's  . COLONOSCOPY  2005   Dr. Laural Golden: pancolonic divericula, polyp, path unknown currently  . COLONOSCOPY  2012   Dr. Oneida Alar: Normal TI, scattered diverticula in entire colon, small internal hemorrhoids, normal colon biopsies. Colonoscopy in 5-10 years.   . COLOSTOMY  05/1979  . COLOSTOMY REVERSAL  11/1979  . EP IMPLANTABLE DEVICE N/A 06/25/2016   Procedure: Lead Revision/Repair;  Surgeon: Will Meredith Leeds, MD;  Location: [facility] CV LAB;  Service: Cardiovascular;  Laterality: N/A;  . EP  IMPLANTABLE DEVICE N/A 06/25/2016   Procedure: Pacemaker Implant;  Surgeon: Will Meredith Leeds, MD;  Location: [facility] CV LAB;  Service: Cardiovascular;  Laterality: N/A;  . EXCISIONAL HEMORRHOIDECTOMY  1970's  . EYE SURGERY Left 2000   "branch vein occlusion"  . EYE SURGERY Left ~ 2001   "smoothed out wrinkle"  . LEFT OOPHORECTOMY  05/1979   nicked bowel, peritonitis, colostomy; colostomy reversed 1981   . LOWER EXTREMITY ANGIOGRAM N/A 01/03/2014   Procedure: LOWER EXTREMITY ANGIOGRAM;  Surgeon: Wellington Hampshire, MD;  Location: [facility] CATH LAB;  Service: Cardiovascular;  Laterality: N/A;  . Nuclear med stress test  10/2011   Small area of mild ischemia inferoapically.  [name] PARTIAL HYSTERECTOMY  1970's   left ovaries, then ovaries removed later due tumors   . RIGHT OOPHORECTOMY  1970's    FAMILY HISTORY Family History  Problem Relation Age of Onset  . Heart disease Mother        deceased  . Heart disease Father        deceased, heart disease  . Diabetes Brother   . Heart disease Brother   . Thyroid disease Brother   . Heart disease Sister   . Heart disease Brother   . Thyroid disease Brother   . Lupus Daughter   . Colon cancer Neg Hx     SOCIAL HISTORY Social History   Tobacco Use  . Smoking status: Never Smoker  . Smokeless tobacco: Never Used  . Tobacco comment: Never smoked  Vaping Use  . Vaping Use: Never used  Substance Use Topics  . Alcohol use: No    Alcohol/week: 0.0 standard drinks  . Drug use: No         OPHTHALMIC EXAM:  Base Eye Exam    Visual Acuity (ETDRS)      Right Left   Dist cc 20/40 -1 CF at 1'   Dist ph cc 20/40 NI   Correction: Glasses       Tonometry (Tonopen, 10:25 AM)  Right Left   Pressure 13 13       Pupils      Dark Light Shape React APD   Right 4 3 Round Brisk None   Left 4 3 Round Brisk +1       Visual Fields (Counting fingers)      Left Right     Full   Restrictions Total superior temporal, inferior temporal,  inferior nasal deficiencies        Extraocular Movement      Right Left    Full Full       Neuro/Psych    Oriented x3: Yes   Mood/Affect: Normal       Dilation    Both eyes: 1.0% Mydriacyl, 2.5% Phenylephrine @ 10:26 AM        Slit Lamp and Fundus Exam    External Exam      Right Left   External Normal Normal       Slit Lamp Exam      Right Left   Lids/Lashes Normal Normal   Conjunctiva/Sclera White and quiet White and quiet   Cornea Clear Clear   Anterior Chamber Deep and quiet Deep and quiet   Iris Round and reactive Round and reactive   Lens Posterior chamber intraocular lens Posterior chamber intraocular lens   Anterior Vitreous Normal Normal       Fundus Exam      Right Left   Posterior Vitreous Posterior vitreous detachment, Central vitreous floaters Normal   Disc Normal Pallor   C/D Ratio 0.2 0.85   Macula Hard drusen,    No Cystoid macular edema, Microaneurysms, Retinal pigment epithelial mottling, no hemorrhage, no exudates Diffuse macular atrophy, old cicatricial  changes   Vessels Old branch retinal vein occlusion Old BRVO   Periphery Normal,  , no retinal hole, no retinal tear Normal          IMAGING AND PROCEDURES  Imaging and Procedures for 08/08/20  OCT, Retina - OU - Both Eyes       Right Eye Quality was good. Scan locations included subfoveal. Central Foveal Thickness: 221. Progression has been stable. Findings include cystoid macular edema.   Left Eye Quality was good. Central Foveal Thickness: 224. Progression has been stable. Findings include subretinal scarring, disciform scar.   Notes Minor perifoveal CME from BRVO persists, multiple recurrences in this monocular patient, currently at 6-week interval.  Repeat injection Avastin OD today  OS, no active maculopathy, cicatricial scarring Accounts for acuity       Intravitreal Injection, Pharmacologic Agent - OD - Right Eye       Time Out 08/08/2020. 10:47 AM. Confirmed correct  patient, procedure, site, and patient consented.   Anesthesia Topical anesthesia was used. Anesthetic medications included Akten 3.5%.   Procedure Preparation included Ofloxacin , 10% betadine to eyelids, 5% betadine to ocular surface, Tobramycin 0.3%. A 30 gauge needle was used.   Injection:  2.5 mg Bevacizumab (AVASTIN) 2.5mg /0.34mL SOSY   NDC: 09470-962-83, Lot: [id number]   Route: Intravitreal, Site: Right Eye  Post-op Post injection exam found visual acuity of at least counting fingers. The patient tolerated the procedure well. There were no complications. The patient received written and verbal post procedure care education.                 ASSESSMENT/PLAN:  Branch retinal vein occlusion with macular edema of right eye Branch retinal vein occlusion right eye, stable at 6-week interval follow-up post Avastin.  Multiple histories  of recurrence in the essentially monocular patient.  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 Branch retinal vein occlusion with macular edema of right eye  H34.8310 OCT, Retina - OU - Both Eyes    Intravitreal Injection, Pharmacologic Agent - OD - Right Eye    bevacizumab (AVASTIN) SOSY 2.5 mg  2. Advanced nonexudative age-related macular degeneration of left eye with subfoveal involvement  H35.3124 OCT, Retina - OU - Both Eyes  3. 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 Posterior vitreous detachment of right eye  H43.811     1.  Based upon findings and improved functioning and present preservation of macular findings OD will repeat injection Avastin today and based upon findings and history of recurrence, repeat evaluation again right eye only in 6  weeks 2.  3.  Ophthalmic Meds Ordered this visit:  Meds ordered this encounter  Medications  . bevacizumab (AVASTIN) SOSY 2.5 mg       Return in about 6 weeks (around 09/19/2020) for dilate, OD, AVASTIN OCT.  There are no Patient Instructions on file for this visit.   Explained the diagnoses, plan, and follow up with the patient and they expressed understanding.  Patient expressed understanding of the importance of proper follow up care.   Clent Demark Debria Broecker M.D. Diseases & Surgery of the Retina and Vitreous [facility] 08/08/20     Abbreviations: M myopia (nearsighted); A astigmatism; H hyperopia (farsighted); P presbyopia; Mrx spectacle prescription;  CTL contact lenses; OD right eye; OS left eye; OU both eyes  XT exotropia; ET esotropia; PEK punctate epithelial keratitis; PEE punctate epithelial erosions; DES dry eye syndrome; MGD meibomian gland dysfunction; ATs artificial tears; PFAT's preservative free artificial tears; [place] nuclear sclerotic cataract; PSC posterior subcapsular cataract; ERM epi-retinal membrane; PVD posterior vitreous detachment; RD retinal detachment; DM diabetes mellitus; DR diabetic retinopathy; NPDR non-proliferative diabetic retinopathy; PDR proliferative diabetic retinopathy; CSME clinically significant macular edema; DME diabetic macular edema; dbh dot blot hemorrhages; CWS cotton wool spot; POAG primary open angle glaucoma; C/D cup-to-disc ratio; HVF humphrey visual field; GVF goldmann visual field; OCT optical coherence tomography; IOP intraocular pressure; BRVO Branch retinal vein occlusion; CRVO central retinal vein occlusion; CRAO central retinal artery occlusion; BRAO branch retinal artery occlusion; RT retinal tear; SB scleral buckle; PPV pars plana vitrectomy; VH Vitreous hemorrhage; PRP panretinal laser photocoagulation; IVK intravitreal kenalog; VMT vitreomacular traction; MH Macular hole;  NVD neovascularization of the disc; NVE  neovascularization elsewhere; AREDS age related eye disease study; ARMD age related macular degeneration; POAG primary open angle glaucoma; EBMD epithelial/anterior basement membrane dystrophy; ACIOL anterior chamber intraocular lens; IOL intraocular lens; PCIOL posterior chamber intraocular lens; Phaco/IOL phacoemulsification with intraocular lens placement; [place] photorefractive keratectomy; LASIK laser assisted in situ keratomileusis; HTN hypertension; DM diabetes mellitus; COPD chronic obstructive pulmonary disease

## 2020-08-08 NOTE — Assessment & Plan Note (Signed)
Recent new floaters, no holes or tears

## 2020-08-19 DIAGNOSIS — E1122 Type 2 diabetes mellitus with diabetic chronic kidney disease: Secondary | ICD-10-CM | POA: Diagnosis not present

## 2020-08-19 DIAGNOSIS — I480 Paroxysmal atrial fibrillation: Secondary | ICD-10-CM | POA: Diagnosis not present

## 2020-08-19 DIAGNOSIS — I130 Hypertensive heart and chronic kidney disease with heart failure and stage 1 through stage 4 chronic kidney disease, or unspecified chronic kidney disease: Secondary | ICD-10-CM | POA: Diagnosis not present

## 2020-08-19 DIAGNOSIS — I509 Heart failure, unspecified: Secondary | ICD-10-CM | POA: Diagnosis not present

## 2020-08-19 DIAGNOSIS — N184 Chronic kidney disease, stage 4 (severe): Secondary | ICD-10-CM | POA: Diagnosis not present

## 2020-08-21 DIAGNOSIS — N390 Urinary tract infection, site not specified: Secondary | ICD-10-CM | POA: Diagnosis not present

## 2020-08-21 DIAGNOSIS — E1129 Type 2 diabetes mellitus with other diabetic kidney complication: Secondary | ICD-10-CM | POA: Diagnosis not present

## 2020-08-21 DIAGNOSIS — N170 Acute kidney failure with tubular necrosis: Secondary | ICD-10-CM | POA: Diagnosis not present

## 2020-08-21 DIAGNOSIS — I5032 Chronic diastolic (congestive) heart failure: Secondary | ICD-10-CM | POA: Diagnosis not present

## 2020-08-21 DIAGNOSIS — I129 Hypertensive chronic kidney disease with stage 1 through stage 4 chronic kidney disease, or unspecified chronic kidney disease: Secondary | ICD-10-CM | POA: Diagnosis not present

## 2020-08-21 DIAGNOSIS — N189 Chronic kidney disease, unspecified: Secondary | ICD-10-CM | POA: Diagnosis not present

## 2020-08-21 DIAGNOSIS — R300 Dysuria: Secondary | ICD-10-CM | POA: Diagnosis not present

## 2020-08-21 DIAGNOSIS — R809 Proteinuria, unspecified: Secondary | ICD-10-CM | POA: Diagnosis not present

## 2020-08-21 DIAGNOSIS — E1122 Type 2 diabetes mellitus with diabetic chronic kidney disease: Secondary | ICD-10-CM | POA: Diagnosis not present

## 2020-08-22 ENCOUNTER — Encounter: Payer: Self-pay | Admitting: Cardiology

## 2020-08-22 ENCOUNTER — Other Ambulatory Visit: Payer: Self-pay

## 2020-08-22 ENCOUNTER — Ambulatory Visit (INDEPENDENT_AMBULATORY_CARE_PROVIDER_SITE_OTHER): Payer: HMO | Admitting: Cardiology

## 2020-08-22 VITALS — BP 142/68 | HR 67 | Ht 63.0 in | Wt 154.0 lb

## 2020-08-22 DIAGNOSIS — I480 Paroxysmal atrial fibrillation: Secondary | ICD-10-CM | POA: Diagnosis not present

## 2020-08-22 DIAGNOSIS — Z79899 Other long term (current) drug therapy: Secondary | ICD-10-CM | POA: Diagnosis not present

## 2020-08-22 MED ORDER — CARVEDILOL 12.5 MG PO TABS: 12.5000 mg | ORAL_TABLET | Freq: Two times a day (BID) | ORAL | 3 refills | Status: DC

## 2020-08-22 MED ORDER — HYDRALAZINE HCL 50 MG PO TABS: 50.0000 mg | ORAL_TABLET | Freq: Three times a day (TID) | ORAL | 3 refills | Status: DC

## 2020-08-22 NOTE — Patient Instructions (Signed)
Medication Instructions:  Your physician has recommended you make the following change in your medication:  1. DECREASE Carvedilol to 12.5 mg twice a day 2. INCREASE Hydralazine 50 mg once daily  *If you need a refill on your cardiac medications before your next appointment, please call your pharmacy*   Lab Work: Today: CMET, TSH & CBC If you have labs (blood work) drawn today and your tests are completely normal, you will receive your results only by: [name] MyChart Message (if you have MyChart) OR . A paper copy in the mail If you have any lab test that is abnormal or we need to change your treatment, we will call you to review the results.   Testing/Procedures: None ordered   Follow-Up: At [facility], you and your health needs are our priority.  As part of our continuing mission to provide you with exceptional heart care, we have created designated Provider Care Teams.  These Care Teams include your primary Cardiologist (physician) and Advanced Practice Providers (APPs -  Physician Assistants and Nurse Practitioners) who all work together to provide you with the care you need, when you need it.  Remote monitoring is used to monitor your Pacemaker or ICD from home. This monitoring reduces the number of office visits required to check your device to one time per year. It allows [place] to keep an eye on the functioning of your device to ensure it is working properly. You are scheduled for a device check from home on 10/08/2020. You may send your transmission at any time that day. If you have a wireless device, the transmission will be sent automatically. After your physician reviews your transmission, you will receive a postcard with your next transmission date.  Your next appointment:   6 month(s)  The format for your next appointment:   In Person  Provider:   Allegra Lai, MD   Thank you for choosing [facility]!!   Trinidad Curet, RN [phone]    Other  Instructions

## 2020-08-22 NOTE — Progress Notes (Signed)
Electrophysiology Office Note   Date:  08/22/2020   ID:  Santia, Labate [birth year], MRN [id number]  PCP:  Sharilyn Sites, MD  Cardiologist:  Bronson Ing Primary Electrophysiologist:  Sharian Delia Meredith Leeds, MD    No chief complaint on file.    History of Present Illness: Virginia Huber is a 75 y.o. female who presents today for electrophysiology evaluation.     She has a history of coronary artery disease status post PCI to the OM1 with an STEMI 03/2015 status post DES to the circumflex, NSTEMI 07/2015 with PCI to the RCA.  She had paroxysmal atrial fibrillation during that admission.  She is currently on Eliquis.  She had a sleep study that showed mild sleep apnea.  She was in the hospital December 2017 and was found to be in atrial fibrillation with rapid rates.  She was cardioverted.  She was readmitted to the hospital later that month with rapid atrial fibrillation.  She had a junctional rhythm and near syncope.  She had postconversion pause of 5.6 seconds.  She is now status post Medtronic dual-chamber pacemaker implanted 06/25/2016 with lead revision 06/26/2016.  Device was complicated by pericardial effusion and tamponade requiring emergent pericardiocentesis.  Today, denies symptoms of palpitations, chest pain, shortness of breath, orthopnea, PND, lower extremity edema, claudication, dizziness, presyncope, syncope, bleeding, or neurologic sequela. The patient is tolerating medications without difficulties.  Since last being seen she has done well.  She has no chest pain or shortness of breath.  She is able to do all of her daily activities without restriction.  She does feel somewhat fatigued which has been over the last few months.  She is on high doses of carvedilol.  She also states that her kidneys have been getting mildly worse.  She has been put on hydralazine.  Past Medical History:  Diagnosis Date  . Acute blood loss anemia 06/28/2016  . Acute diastolic CHF (congestive heart  failure), NYHA class 3 ([place])   . Acute exacerbation of CHF (congestive heart failure) ([place]) 08/24/2019  . Acute respiratory failure with hypoxia ([place]) 03/01/2012  . Advanced nonexudative age-related macular degeneration of left eye with subfoveal involvement 06/26/2020   Ongoing, accounts for acuity  . 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 Arthritis   . Atrial fibrillation and flutter ([place])    a. h/o PAF/flutter during admission in 2013 for PNA. b. PAF during adm for NSTEMI 07/2015, subsequent paroxysms since then.  . Atrial fibrillation with rapid ventricular response ([place])   . B12 deficiency anemia   . Bradycardia 06/14/2016  . Branch retinal vein occlusion with macular edema of right eye 10/11/2019  . Bronchospasm 03/02/2012  . CAD in native artery 11/30/2014  . Chronic renal failure, stage 3b ([place]) 09/23/2017  . Coronary artery disease 11/30/2014   a. remote MI. b. h/o PTCA with scoring balloon to OM1 11/2014. c. NSTEMI 03/2015 s/p DES to prox-mid Cx. d. NSTEMI 07/2015 s/p scoring balloon/PTCA/DES to dRCA with PAF during that admission  . Coronary artery disease involving coronary bypass graft of native heart with unstable angina pectoris ([place])   . 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   . Cutaneous lupus erythematosus   . Early stage nonexudative age-related macular degeneration of right eye 02/27/2020  . Essential hypertension   . FRACTURE, TOE 12/06/2007   Qualifier: Diagnosis of  By: Aline Brochure MD, Dorothyann Peng    . GERD (gastroesophageal reflux disease)   . History of  blood transfusion 1980's   2nd surgical procedures  . Hypercholesteremia   . Hypokalemia 03/05/2012  . Hypothyroidism   . Multiple and bilateral precerebral artery syndromes 05/13/2016  . Myocardial infarction ([place]) 02/2012  . NSTEMI (non-ST elevated myocardial infarction) ([place]) 04/02/2015  . OSA (obstructive sleep apnea) 05/13/2016  . Ovarian tumor   . PAD  (peripheral artery disease) ([place])    a. s/p LE angio 2015; followed by Dr. Fletcher Anon - managed medically.  . Pain with urination 05/08/2015  . Paroxysmal atrial fibrillation ([place]) 12/08/2017  . Pericardial effusion    a. 06/2016 after ppm - s/p pericardiocentesis.  [name] Posterior vitreous detachment of right eye 10/11/2019  . Retinal microaneurysm of right eye 10/11/2019  . Right retinal defect 10/11/2019  . RLQ abdominal pain 11/24/2010  . S/P pericardiocentesis 06/28/2016  . 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 Stable central retinal vein occlusion of left eye 05/13/2020  . Superficial fungus infection of skin 06/29/2013  . Systolic congestive heart failure ([place]) 05/13/2016  . Tachy-brady syndrome ([place])    a. s/p Medtronic PPM 06/2016, c/b lead perf/pericardial effusion.  . Tamponade   . TIA (transient ischemic attack)   . Type 2 diabetes with nephropathy ([place]) 02/29/2012  . Type II diabetes mellitus ([place])   . Typical atrial flutter ([place])   . UTI (urinary tract infection) 05/08/2013   Past Surgical History:  Procedure Laterality Date  . ABDOMINAL AORTAGRAM N/A 01/03/2014   Procedure: ABDOMINAL [name];  Surgeon: Wellington Hampshire, MD;  Location: [facility] CATH [facility];  Service: Cardiovascular;  Laterality: N/A;  . ABDOMINAL HYSTERECTOMY  1972   "partial"  . APPENDECTOMY  1970's  . CARDIAC CATHETERIZATION  2008   Tiny OM-2 with 90% narrowing. Med tx.  [name] CARDIAC CATHETERIZATION N/A 11/30/2014   Procedure: Left Heart Cath and Coronary Angiography;  Surgeon: Troy Sine, MD; LAD 20%, CFX 50%, OM1 95%, right PLB 30%, LV normal   . CARDIAC CATHETERIZATION N/A 11/30/2014   Procedure: Coronary Balloon Angioplasty;  Surgeon: Troy Sine, MD;  Angiosculpt scoring balloon and PTCA to the OM1 reducing stenosis from 95% to less than 10%  . CARDIAC CATHETERIZATION N/A 04/03/2015   Procedure: Left Heart Cath and Coronary Angiography;  Surgeon: Jolaine Artist, MD; dLAD 50%, CFX  90%, OM1 100%, PLA 15%, LVEDP 13    . CARDIAC CATHETERIZATION N/A 04/03/2015   Procedure: Coronary Stent Intervention;  Surgeon: Sherren Mocha, MD; 3.0x18 mm Xience DES to the CFX    . CARDIAC CATHETERIZATION N/A 08/02/2015   Procedure: Left Heart Cath and Coronary Angiography;  Surgeon: Troy Sine, MD;  Location: [facility];  Service: Cardiovascular;  Laterality: N/A;  . CARDIAC CATHETERIZATION N/A 08/02/2015   Procedure: Coronary Stent Intervention;  Surgeon: Troy Sine, MD;  Location: [facility];  Service: Cardiovascular;  Laterality: N/A;  . CARDIAC CATHETERIZATION N/A 06/25/2016   Procedure: Pericardiocentesis;  Surgeon: Gunda Maqueda Meredith Leeds, MD;  Location: [facility];  Service: Cardiovascular;  Laterality: N/A;  . cardiac stents    . CARDIOVERSION N/A 12/15/2017   Procedure: CARDIOVERSION;  Surgeon: Acie Fredrickson Wonda Cheng, MD;  Location: [facility] ENDOSCOPY;  Service: Cardiovascular;  Laterality: N/A;  . CHOLECYSTECTOMY OPEN  1990's  . COLONOSCOPY  2005   Dr. Laural Golden: pancolonic divericula, polyp, path unknown currently  . COLONOSCOPY  2012   Dr. Oneida Alar: Normal TI, scattered diverticula in entire colon, small internal hemorrhoids, normal colon biopsies. Colonoscopy in 5-10 years.   [name]  COLOSTOMY  05/1979  . COLOSTOMY REVERSAL  11/1979  . EP IMPLANTABLE DEVICE N/A 06/25/2016   Procedure: Lead Revision/Repair;  Surgeon: Afifa Truax Meredith Leeds, MD;  Location: [facility] CV LAB;  Service: Cardiovascular;  Laterality: N/A;  . EP IMPLANTABLE DEVICE N/A 06/25/2016   Procedure: Pacemaker Implant;  Surgeon: Shanti Eichel Meredith Leeds, MD;  Location: [facility] CV LAB;  Service: Cardiovascular;  Laterality: N/A;  . EXCISIONAL HEMORRHOIDECTOMY  1970's  . EYE SURGERY Left 2000   "branch vein occlusion"  . EYE SURGERY Left ~ 2001   "smoothed out wrinkle"  . LEFT OOPHORECTOMY  05/1979   nicked bowel, peritonitis, colostomy; colostomy reversed 1981   . LOWER EXTREMITY ANGIOGRAM N/A 01/03/2014    Procedure: LOWER EXTREMITY ANGIOGRAM;  Surgeon: Wellington Hampshire, MD;  Location: [facility] CATH LAB;  Service: Cardiovascular;  Laterality: N/A;  . Nuclear med stress test  10/2011   Small area of mild ischemia inferoapically.  [name] PARTIAL HYSTERECTOMY  1970's   left ovaries, then ovaries removed later due tumors   . RIGHT OOPHORECTOMY  1970's     Current Outpatient Medications  Medication Sig Dispense Refill  . acetaminophen (TYLENOL) 325 MG tablet Take 650 mg by mouth every 6 (six) hours as needed for headache.    [name] amiodarone (PACERONE) 200 MG tablet Take 0.5 tablets (100 mg total) by mouth daily. 45 tablet 1  . apixaban (ELIQUIS) 5 MG TABS tablet Take 1 tablet (5 mg total) by mouth 2 (two) times daily. 28 tablet 0  . atorvastatin (LIPITOR) 80 MG tablet TAKE ONE TABLET BY MOUTH EVERY DAY AT 6:00PM 90 tablet 3  . Cholecalciferol (VITAMIN D3 PO) Take 1 tablet by mouth daily.     . clopidogrel (PLAVIX) 75 MG tablet TAKE ONE TABLET (75MG TOTAL) BY MOUTH DAILY 90 tablet 3  . Continuous Blood Gluc Receiver (FREESTYLE LIBRE 2 READER) DEVI 1 each by Does not apply route daily. 1 each 0  . Continuous Blood Gluc Sensor (FREESTYLE LIBRE 2 SENSOR) MISC 1 each by Does not apply route every 14 (fourteen) days. 6 each 3  . cyanocobalamin (,VITAMIN B-12,) 1000 MCG/ML injection Inject 1,000 mcg into the muscle every 30 (thirty) days. Last injection was around 08/02/19    . diltiazem (CARDIZEM CD) 360 MG 24 hr capsule Take 1 capsule (360 mg total) by mouth daily. 90 capsule 3  . Dulaglutide (TRULICITY) 1.5 UG/8.9VQ SOPN Inject 1.5 mg into the skin once a week. 6 mL 3  . FREESTYLE LITE test strip 1 each by Other route 2 (two) times daily.     . furosemide (LASIX) 40 MG tablet TAKE ONE AND HALF TABLET (60MG TOTAL) BY MOUTH IN THE MORNING AND ONE TABLET IN THE EVENING (40MG TOTAL) 180 tablet 4  . hydrALAZINE (APRESOLINE) 25 MG tablet Take 1 tablet (25 mg total) by mouth in the morning and at bedtime. 180 tablet 3  .  insulin NPH-regular Human (70-30) 100 UNIT/ML injection Inject 35 units before breakfast and 35 units before supper 45 mL 3  . levothyroxine (SYNTHROID) 137 MCG tablet Take 137 mcg by mouth daily before breakfast.    . nitroGLYCERIN (NITROSTAT) 0.4 MG SL tablet Place 1 tablet (0.4 mg total) under the tongue every 5 (five) minutes as needed for chest pain. 25 tablet 0  . olmesartan (BENICAR) 40 MG tablet Take 40 mg by mouth daily.    . potassium chloride SA (KLOR-CON) 20 MEQ tablet TAKE ONE (1) TABLET BY MOUTH EVERY DAY 90 tablet  1  . rOPINIRole (REQUIP) 0.5 MG tablet Take 0.5 mg by mouth at bedtime.   0   No current facility-administered medications for this visit.    Allergies:   Penicillins and Percocet [oxycodone-acetaminophen]   Social History:  The patient  reports that she has never smoked. She has never used smokeless tobacco. She reports that she does not drink alcohol and does not use drugs.   Family History:  The patient's family history includes Diabetes in her brother; Heart disease in her brother, brother, father, mother, and sister; Lupus in her daughter; Thyroid disease in her brother and brother.   ROS:  Please see the history of present illness.   Otherwise, review of systems is positive for none.   All other systems are reviewed and negative.   PHYSICAL EXAM: VS:  BP (!) 142/68   Pulse 67   Ht 5' 3"  (1.6 m)   Wt 154 lb (69.9 kg)   SpO2 96%   BMI 27.28 kg/m  , BMI Body mass index is 27.28 kg/m. GEN: Well nourished, well developed, in no acute distress  HEENT: normal  Neck: no JVD, carotid bruits, or masses Cardiac: RRR; no murmurs, rubs, or gallops,no edema  Respiratory:  clear to auscultation bilaterally, normal work of breathing GI: soft, nontender, nondistended, + BS MS: no deformity or atrophy  Skin: warm and dry, device site well healed Neuro:  Strength and sensation are intact Psych: euthymic mood, full affect  EKG:  EKG is ordered today. Personal review  of the ekg ordered shows atrial paced, rate 67  Personal review of the device interrogation today. Results in [place]: 08/24/2019: B Natriuretic Peptide 547.0 08/25/2019: Hemoglobin 9.8; Platelets 259 08/26/2019: Potassium 4.2; Sodium 140 12/04/2019: BUN 24; Creatinine 1.5 02/22/2020: ALT 14; TSH 2.410    Lipid Panel     Component Value Date/Time   CHOL 166 08/05/2017 0807   TRIG 160 (H) 08/05/2017 0807   HDL 43 (L) 08/05/2017 0807   CHOLHDL 3.9 08/05/2017 0807   VLDL NOT CALC 04/14/2016 0910   LDLCALC 96 08/05/2017 0807     Wt Readings from Last 3 Encounters:  08/22/20 154 lb (69.9 kg)  08/07/20 154 lb 1.6 oz (69.9 kg)  07/23/20 153 lb 9.6 oz (69.7 kg)      Other studies Reviewed: Additional studies/ records that were reviewed today include: TTE 08/04/17 Review of the above records today demonstrates:  - Left ventricle: The cavity size was normal. Wall thickness was   increased in a pattern of moderate LVH. Systolic function was   normal. The estimated ejection fraction was in the range of 55%   to 60%. Wall motion was normal; there were no regional wall   motion abnormalities. Features are consistent with a pseudonormal   left ventricular filling pattern, with concomitant abnormal   relaxation and increased filling pressure (grade 2 diastolic   dysfunction). Doppler parameters are consistent with high   ventricular filling pressure. - Left atrium: The atrium was severely dilated. - Right ventricle: Pacer wire or catheter noted in right ventricle. - Right atrium: Pacer wire or catheter noted in right atrium.  Cath  08/02/15  1st RPLB lesion, 50% stenosed.  Dist RCA lesion, 30% stenosed.  Ost 1st Mrg to 1st Mrg lesion, 100% stenosed.  Ost LAD lesion, 40% stenosed.  Mid LAD lesion, 40% stenosed.  Post Atrio lesion, 90% stenosed. Post intervention, there is a 0% residual stenosis.  The left ventricular systolic function is normal.  Mid RCA lesion, 20%  stenosed.   Normal LV function without focal segmental wall motion abnormalities and ejection fraction of 55%.  Successful percutaneous cardiac intervention to the distal RCA treated with Angiosculpt scoring balloon, PTCA, and ultimate stenting with a 2.58 mm Xience Alpine DES stent postdilated to 2.51 mm with a 90% stenosis reduced to 0% and no change in the ostial PDA narrowing.  ASSESSMENT AND PLAN:  1.  Paroxysmal atrial fibrillation with tachybradycardia syndrome: Status post Medtronic dual-chamber pacemaker.  Device functioning appropriately.  Currently on amiodarone and Eliquis with a CHA2DS2-VASc of 7.  High risk medication monitoring.  She is remained in sinus rhythm.  No changes at this time.  We have turned her pacemaker down to DDDR 50.  2.  Coronary artery disease: No current chest pain  3.  Hypertension: Blood pressure is elevated today.  She does state that she feels somewhat fatigued.  Due to that, we Terran Klinke decrease her carvedilol to 12.5 mg and increase hydralazine to 50 mg.  Current medicines are reviewed at length with the patient today.   The patient does not have concerns regarding her medicines.  Increase hydralazine, decrease carvedilol  Labs/ tests ordered today include:  Orders Placed This Encounter  Procedures  . Comp Met (CMET)  . TSH  . CBC  . EKG 12-Lead     Disposition:   FU with Elyas Villamor 6 months  Signed, Jeydan Barner Meredith Leeds, MD  08/22/2020 4:30 PM     [address] [place] [address] [phone] (office) [phone] (fax)

## 2020-08-23 LAB — COMPREHENSIVE METABOLIC PANEL
ALT: 13 IU/L (ref 0–32)
AST: 9 IU/L (ref 0–40)
Albumin/Globulin Ratio: 2.3 — ABNORMAL HIGH (ref 1.2–2.2)
Albumin: 4.4 g/dL (ref 3.7–4.7)
Alkaline Phosphatase: 160 IU/L — ABNORMAL HIGH (ref 44–121)
BUN/Creatinine Ratio: 17 (ref 12–28)
BUN: 30 mg/dL — ABNORMAL HIGH (ref 8–27)
Bilirubin Total: 0.3 mg/dL (ref 0.0–1.2)
CO2: 20 mmol/L (ref 20–29)
Calcium: 9.2 mg/dL (ref 8.7–10.3)
Chloride: 103 mmol/L (ref 96–106)
Creatinine, Ser: 1.79 mg/dL — ABNORMAL HIGH (ref 0.57–1.00)
Globulin, Total: 1.9 g/dL (ref 1.5–4.5)
Glucose: 196 mg/dL — ABNORMAL HIGH (ref 65–99)
Potassium: 4 mmol/L (ref 3.5–5.2)
Sodium: 140 mmol/L (ref 134–144)
Total Protein: 6.3 g/dL (ref 6.0–8.5)
eGFR: 29 mL/min/{1.73_m2} — ABNORMAL LOW (ref >59)

## 2020-08-23 LAB — CBC
Hematocrit: 37.7 % (ref 34.0–46.6)
Hemoglobin: 12.4 g/dL (ref 11.1–15.9)
MCH: 30.5 pg (ref 26.6–33.0)
MCHC: 32.9 g/dL (ref 31.5–35.7)
MCV: 93 fL (ref 79–97)
Platelets: 265 10*3/uL (ref 150–450)
RBC: 4.06 x10E6/uL (ref 3.77–5.28)
RDW: 13.3 % (ref 11.7–15.4)
WBC: 8.5 10*3/uL (ref 3.4–10.8)

## 2020-08-23 LAB — TSH: TSH: 0.233 u[IU]/mL — ABNORMAL LOW (ref 0.450–4.500)

## 2020-08-26 ENCOUNTER — Other Ambulatory Visit: Payer: Self-pay

## 2020-08-26 ENCOUNTER — Ambulatory Visit (HOSPITAL_COMMUNITY)
Admission: RE | Admit: 2020-08-26 | Discharge: 2020-08-26 | Disposition: A | Discharge status: Home / Self Care | Payer: HMO | Source: Ambulatory Visit | Attending: Family Medicine | Admitting: Family Medicine

## 2020-08-26 DIAGNOSIS — Z1231 Encounter for screening mammogram for malignant neoplasm of breast: Secondary | ICD-10-CM | POA: Diagnosis not present

## 2020-08-28 DIAGNOSIS — N189 Chronic kidney disease, unspecified: Secondary | ICD-10-CM | POA: Diagnosis not present

## 2020-08-28 DIAGNOSIS — E872 Acidosis: Secondary | ICD-10-CM | POA: Diagnosis not present

## 2020-08-28 DIAGNOSIS — N390 Urinary tract infection, site not specified: Secondary | ICD-10-CM | POA: Diagnosis not present

## 2020-08-28 DIAGNOSIS — E211 Secondary hyperparathyroidism, not elsewhere classified: Secondary | ICD-10-CM | POA: Diagnosis not present

## 2020-08-28 DIAGNOSIS — E1122 Type 2 diabetes mellitus with diabetic chronic kidney disease: Secondary | ICD-10-CM | POA: Diagnosis not present

## 2020-08-28 DIAGNOSIS — R809 Proteinuria, unspecified: Secondary | ICD-10-CM | POA: Diagnosis not present

## 2020-08-28 DIAGNOSIS — I5032 Chronic diastolic (congestive) heart failure: Secondary | ICD-10-CM | POA: Diagnosis not present

## 2020-08-28 DIAGNOSIS — I129 Hypertensive chronic kidney disease with stage 1 through stage 4 chronic kidney disease, or unspecified chronic kidney disease: Secondary | ICD-10-CM | POA: Diagnosis not present

## 2020-08-28 DIAGNOSIS — E559 Vitamin D deficiency, unspecified: Secondary | ICD-10-CM | POA: Diagnosis not present

## 2020-08-28 DIAGNOSIS — E1129 Type 2 diabetes mellitus with other diabetic kidney complication: Secondary | ICD-10-CM | POA: Diagnosis not present

## 2020-08-30 ENCOUNTER — Ambulatory Visit (HOSPITAL_COMMUNITY): Payer: HMO

## 2020-09-04 ENCOUNTER — Encounter (INDEPENDENT_AMBULATORY_CARE_PROVIDER_SITE_OTHER): Payer: HMO | Admitting: Ophthalmology

## 2020-09-19 ENCOUNTER — Encounter (INDEPENDENT_AMBULATORY_CARE_PROVIDER_SITE_OTHER): Payer: Self-pay | Admitting: Ophthalmology

## 2020-09-19 ENCOUNTER — Ambulatory Visit (INDEPENDENT_AMBULATORY_CARE_PROVIDER_SITE_OTHER): Payer: HMO | Admitting: Ophthalmology

## 2020-09-19 ENCOUNTER — Other Ambulatory Visit: Payer: Self-pay

## 2020-09-19 DIAGNOSIS — H34831 Tributary (branch) retinal vein occlusion, right eye, with macular edema: Secondary | ICD-10-CM

## 2020-09-19 MED ORDER — BEVACIZUMAB 2.5 MG/0.1ML IZ SOSY
2.5000 mg | PREFILLED_SYRINGE | INTRAVITREAL | Status: AC | PRN
  Administered 2020-09-19: 2.5 mg via INTRAVITREAL

## 2020-09-19 NOTE — Assessment & Plan Note (Signed)
Persistent and recurrent in the past with perifoveal CME, preserved acuity yet slightly increased this time at 6 weeks will repeat injection today and follow-up in 6 weeks as per schedule limitations

## 2020-09-19 NOTE — Progress Notes (Signed)
09/19/2020     CHIEF COMPLAINT Patient presents for Retina Follow Up (6 Wk F/U OD, poss Avastin OD//Pt denies noticeable changes to [place] OU since last visit. Pt denies ocular pain, flashes of light, or floaters OU. //)   HISTORY OF PRESENT ILLNESS: Virginia Huber is a 75 y.o. female who presents to the clinic today for:   HPI    Retina Follow Up    Patient presents with  CRVO/BRVO.  In right eye.  This started 6 weeks ago.  Severity is mild.  Duration of 6 weeks.  Since onset it is stable. Additional comments: 6 Wk F/U OD, poss Avastin OD  Pt denies noticeable changes to [place] OU since last visit. Pt denies ocular pain, flashes of light, or floaters OU.          Last edited by Rockie Neighbours, [place] on 09/19/2020  9:49 AM. (History)      Referring physician: Sharilyn Sites, MD [address] [place],  [place] [id number]  HISTORICAL INFORMATION:   Selected notes from the MEDICAL RECORD NUMBER    Lab Results  Component Value Date   HGBA1C 6.7 (A) 07/23/2020     CURRENT MEDICATIONS: No current outpatient medications on file. (Ophthalmic Drugs)   No current facility-administered medications for this visit. (Ophthalmic Drugs)   Current Outpatient Medications (Other)  Medication Sig  . acetaminophen (TYLENOL) 325 MG tablet Take 650 mg by mouth every 6 (six) hours as needed for headache.  [name] amiodarone (PACERONE) 200 MG tablet Take 0.5 tablets (100 mg total) by mouth daily.  [name] apixaban (ELIQUIS) 5 MG TABS tablet Take 1 tablet (5 mg total) by mouth 2 (two) times daily.  [name] atorvastatin (LIPITOR) 80 MG tablet TAKE ONE TABLET BY MOUTH EVERY DAY AT 6:00PM  . carvedilol (COREG) 12.5 MG tablet Take 1 tablet (12.5 mg total) by mouth 2 (two) times daily.  . Cholecalciferol (VITAMIN D3 PO) Take 1 tablet by mouth daily.   . clopidogrel (PLAVIX) 75 MG tablet TAKE ONE TABLET (75MG  TOTAL) BY MOUTH DAILY  . Continuous Blood Gluc Receiver (FREESTYLE LIBRE 2 READER) DEVI 1 each by Does not apply  route daily.  . Continuous Blood Gluc Sensor (FREESTYLE LIBRE 2 SENSOR) MISC 1 each by Does not apply route every 14 (fourteen) days.  . cyanocobalamin (,VITAMIN B-12,) 1000 MCG/ML injection Inject 1,000 mcg into the muscle every 30 (thirty) days. Last injection was around 08/02/19  . diltiazem (CARDIZEM CD) 360 MG 24 hr capsule Take 1 capsule (360 mg total) by mouth daily.  . Dulaglutide (TRULICITY) 1.5 DJ/2.4QA SOPN Inject 1.5 mg into the skin once a week.  [name] FREESTYLE LITE test strip 1 each by Other route 2 (two) times daily.   . furosemide (LASIX) 40 MG tablet TAKE ONE AND HALF TABLET (60MG  TOTAL) BY MOUTH IN THE MORNING AND ONE TABLET IN THE EVENING (40MG  TOTAL)  . hydrALAZINE (APRESOLINE) 50 MG tablet Take 1 tablet (50 mg total) by mouth 3 (three) times daily.  . insulin NPH-regular Human (70-30) 100 UNIT/ML injection Inject 35 units before breakfast and 35 units before supper  . levothyroxine (SYNTHROID) 137 MCG tablet Take 137 mcg by mouth daily before breakfast.  . nitroGLYCERIN (NITROSTAT) 0.4 MG SL tablet Place 1 tablet (0.4 mg total) under the tongue every 5 (five) minutes as needed for chest pain.  [name] olmesartan (BENICAR) 40 MG tablet Take 40 mg by mouth daily.  . potassium chloride SA (KLOR-CON) 20 MEQ tablet TAKE ONE (1)  TABLET BY MOUTH EVERY DAY  . rOPINIRole (REQUIP) 0.5 MG tablet Take 0.5 mg by mouth at bedtime.    No current facility-administered medications for this visit. (Other)      REVIEW OF SYSTEMS:    ALLERGIES Allergies  Allergen Reactions  . Penicillins Hives    Has patient had a PCN reaction causing immediate rash, facial/tongue/throat swelling, SOB or lightheadedness with hypotension: Yes Has patient had a PCN reaction causing severe rash involving mucus membranes or skin necrosis: No Has patient had a PCN reaction that required hospitalization No Has patient had a PCN reaction occurring within the last 10 years: No If all of the above answers are "NO",  then may proceed with Cephalosporin use.  [name] Percocet [Oxycodone-Acetaminophen] Nausea And Vomiting    PAST MEDICAL HISTORY Past Medical History:  Diagnosis Date  . Acute blood loss anemia 06/28/2016  . Acute diastolic CHF (congestive heart failure), NYHA class 3 ([place])   . Acute exacerbation of CHF (congestive heart failure) ([place]) 08/24/2019  . Acute respiratory failure with hypoxia ([place]) 03/01/2012  . Advanced nonexudative age-related macular degeneration of left eye with subfoveal involvement 06/26/2020   Ongoing, accounts for acuity  . 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 Arthritis   . Atrial fibrillation and flutter ([place])    a. h/o PAF/flutter during admission in 2013 for PNA. b. PAF during adm for NSTEMI 07/2015, subsequent paroxysms since then.  . Atrial fibrillation with rapid ventricular response ([place])   . B12 deficiency anemia   . Bradycardia 06/14/2016  . Branch retinal vein occlusion with macular edema of right eye 10/11/2019  . Bronchospasm 03/02/2012  . CAD in native artery 11/30/2014  . Chronic renal failure, stage 3b ([place]) 09/23/2017  . Coronary artery disease 11/30/2014   a. remote MI. b. h/o PTCA with scoring balloon to OM1 11/2014. c. NSTEMI 03/2015 s/p DES to prox-mid Cx. d. NSTEMI 07/2015 s/p scoring balloon/PTCA/DES to dRCA with PAF during that admission  . Coronary artery disease involving coronary bypass graft of native heart with unstable angina pectoris ([place])   . 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   . Cutaneous lupus erythematosus   . Early stage nonexudative age-related macular degeneration of right eye 02/27/2020  . Essential hypertension   . FRACTURE, TOE 12/06/2007   Qualifier: Diagnosis of  By: Aline Brochure MD, Dorothyann Peng    . GERD (gastroesophageal reflux disease)   . History of blood transfusion 1980's   2nd surgical procedures  . Hypercholesteremia   . Hypokalemia 03/05/2012  . Hypothyroidism   .  Multiple and bilateral precerebral artery syndromes 05/13/2016  . Myocardial infarction ([place]) 02/2012  . NSTEMI (non-ST elevated myocardial infarction) ([place]) 04/02/2015  . OSA (obstructive sleep apnea) 05/13/2016  . Ovarian tumor   . PAD (peripheral artery disease) ([place])    a. s/p LE angio 2015; followed by Dr. Fletcher Anon - managed medically.  . Pain with urination 05/08/2015  . Paroxysmal atrial fibrillation ([place]) 12/08/2017  . Pericardial effusion    a. 06/2016 after ppm - s/p pericardiocentesis.  [name] Posterior vitreous detachment of right eye 10/11/2019  . Retinal microaneurysm of right eye 10/11/2019  . Right retinal defect 10/11/2019  . RLQ abdominal pain 11/24/2010  . S/P pericardiocentesis 06/28/2016  . 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 Stable central retinal vein occlusion of left eye 05/13/2020  . Superficial fungus infection of skin 06/29/2013  . Systolic congestive heart failure ([place])  05/13/2016  . Tachy-brady syndrome ([place])    a. s/p Medtronic PPM 06/2016, c/b lead perf/pericardial effusion.  . Tamponade   . TIA (transient ischemic attack)   . Type 2 diabetes with nephropathy ([place]) 02/29/2012  . Type II diabetes mellitus ([place])   . Typical atrial flutter ([place])   . UTI (urinary tract infection) 05/08/2013   Past Surgical History:  Procedure Laterality Date  . ABDOMINAL AORTAGRAM N/A 01/03/2014   Procedure: ABDOMINAL [name];  Surgeon: Wellington Hampshire, MD;  Location: [facility];  Service: Cardiovascular;  Laterality: N/A;  . ABDOMINAL HYSTERECTOMY  1972   "partial"  . APPENDECTOMY  1970's  . CARDIAC CATHETERIZATION  2008   Tiny OM-2 with 90% narrowing. Med tx.  [name] CARDIAC CATHETERIZATION N/A 11/30/2014   Procedure: Left Heart Cath and Coronary Angiography;  Surgeon: Troy Sine, MD; LAD 20%, CFX 50%, OM1 95%, right PLB 30%, LV normal   . CARDIAC CATHETERIZATION N/A 11/30/2014   Procedure: Coronary Balloon Angioplasty;  Surgeon: Troy Sine, MD;   Angiosculpt scoring balloon and PTCA to the OM1 reducing stenosis from 95% to less than 10%  . CARDIAC CATHETERIZATION N/A 04/03/2015   Procedure: Left Heart Cath and Coronary Angiography;  Surgeon: Jolaine Artist, MD; dLAD 50%, CFX 90%, OM1 100%, PLA 15%, LVEDP 13    . CARDIAC CATHETERIZATION N/A 04/03/2015   Procedure: Coronary Stent Intervention;  Surgeon: Sherren Mocha, MD; 3.0x18 mm Xience DES to the CFX    . CARDIAC CATHETERIZATION N/A 08/02/2015   Procedure: Left Heart Cath and Coronary Angiography;  Surgeon: Troy Sine, MD;  Location: [facility];  Service: Cardiovascular;  Laterality: N/A;  . CARDIAC CATHETERIZATION N/A 08/02/2015   Procedure: Coronary Stent Intervention;  Surgeon: Troy Sine, MD;  Location: [facility];  Service: Cardiovascular;  Laterality: N/A;  . CARDIAC CATHETERIZATION N/A 06/25/2016   Procedure: Pericardiocentesis;  Surgeon: Will Meredith Leeds, MD;  Location: [facility];  Service: Cardiovascular;  Laterality: N/A;  . cardiac stents    . CARDIOVERSION N/A 12/15/2017   Procedure: CARDIOVERSION;  Surgeon: Acie Fredrickson Wonda Cheng, MD;  Location[facility];  Service: Cardiovascular;  Laterality: N/A;  . CHOLECYSTECTOMY OPEN  1990's  . COLONOSCOPY  2005   Dr. Laural Golden: pancolonic divericula, polyp, path unknown currently  . COLONOSCOPY  2012   Dr. Oneida Alar: Normal TI, scattered diverticula in entire colon, small internal hemorrhoids, normal colon biopsies. Colonoscopy in 5-10 years.   . COLOSTOMY  05/1979  . COLOSTOMY REVERSAL  11/1979  . EP IMPLANTABLE DEVICE N/A 06/25/2016   Procedure: Lead Revision/Repair;  Surgeon: Will Meredith Leeds, MD;  Location: [facility];  Service: Cardiovascular;  Laterality: N/A;  . EP IMPLANTABLE DEVICE N/A 06/25/2016   Procedure: Pacemaker Implant;  Surgeon: Will Meredith Leeds, MD;  Location: [facility];  Service: Cardiovascular;  Laterality: N/A;  . EXCISIONAL HEMORRHOIDECTOMY  1970's  . EYE  SURGERY Left 2000   "branch vein occlusion"  . EYE SURGERY Left ~ 2001   "smoothed out wrinkle"  . LEFT OOPHORECTOMY  05/1979   nicked bowel, peritonitis, colostomy; colostomy reversed 1981   . LOWER EXTREMITY ANGIOGRAM N/A 01/03/2014   Procedure: LOWER EXTREMITY ANGIOGRAM;  Surgeon: Wellington Hampshire, MD;  Location: [facility] CATH LAB;  Service: Cardiovascular;  Laterality: N/A;  . Nuclear med stress test  10/2011   Small area of mild ischemia inferoapically.  [name] PARTIAL HYSTERECTOMY  1970's   left ovaries, then ovaries removed  later due tumors   . RIGHT OOPHORECTOMY  1970's    FAMILY HISTORY Family History  Problem Relation Age of Onset  . Heart disease Mother        deceased  . Heart disease Father        deceased, heart disease  . Diabetes Brother   . Heart disease Brother   . Thyroid disease Brother   . Heart disease Sister   . Heart disease Brother   . Thyroid disease Brother   . Lupus Daughter   . Colon cancer Neg Hx     SOCIAL HISTORY Social History   Tobacco Use  . Smoking status: Never Smoker  . Smokeless tobacco: Never Used  . Tobacco comment: Never smoked  Vaping Use  . Vaping Use: Never used  Substance Use Topics  . Alcohol use: No    Alcohol/week: 0.0 standard drinks  . Drug use: No         OPHTHALMIC EXAM: Base Eye Exam    Visual Acuity (ETDRS)      Right Left   Dist cc 20/50 CF @ 1'   Dist ph cc NI NI   Correction: Glasses       Tonometry (Tonopen, 9:50 AM)      Right Left   Pressure 09 08       Pupils      Dark Light Shape React APD   Right 5.5 4.5 Round Brisk None   Left 5.5 4.5 Round Brisk +1       Visual Fields (Counting fingers)      Left Right     Full   Restrictions Total superior temporal, inferior temporal, inferior nasal deficiencies        Extraocular Movement      Right Left    Full Full       Neuro/Psych    Oriented x3: Yes   Mood/Affect: Normal       Dilation    Right eye: 1.0% Mydriacyl, 2.5% Phenylephrine @  9:53 AM        Slit Lamp and Fundus Exam    External Exam      Right Left   External Normal Normal       Slit Lamp Exam      Right Left   Lids/Lashes Normal Normal   Conjunctiva/Sclera White and quiet White and quiet   Cornea Clear Clear   Anterior Chamber Deep and quiet Deep and quiet   Iris Round and reactive Round and reactive   Lens Posterior chamber intraocular lens Posterior chamber intraocular lens   Anterior Vitreous Normal Normal       Fundus Exam      Right Left   Posterior Vitreous Posterior vitreous detachment, Central vitreous floaters    Disc Normal    C/D Ratio 0.2    Macula Hard drusen,    No Cystoid macular edema, Microaneurysms, Retinal pigment epithelial mottling, no hemorrhage, no exudates    Vessels Old branch retinal vein occlusion    Periphery Normal,  , no retinal hole, no retinal tear           IMAGING AND PROCEDURES  Imaging and Procedures for 09/19/20  OCT, Retina - OU - Both Eyes       Right Eye Quality was good. Scan locations included subfoveal. Central Foveal Thickness: 225. Progression has been stable. Findings include cystoid macular edema.   Left Eye Quality was poor. Scan locations included subfoveal. Central Foveal Thickness: 213. Progression has  been stable. Findings include subretinal scarring, disciform scar.   Notes Minor perifoveal CME from BRVO persists, multiple recurrences in this monocular patient, currently at 6-week interval.  Repeat injection Avastin OD today and follow-up again in 6 weeks  OS, no active maculopathy, cicatricial scarring Accounts for acuity       Intravitreal Injection, Pharmacologic Agent - OD - Right Eye       Time Out 09/19/2020. 11:16 AM. Confirmed correct patient, procedure, site, and patient consented.   Anesthesia Topical anesthesia was used. Anesthetic medications included Akten 3.5%.   Procedure Preparation included 10% betadine to eyelids, 5% betadine to ocular surface,  Tobramycin 0.3%. A 30 gauge needle was used.   Injection:  2.5 mg Bevacizumab (AVASTIN) 2.5mg /0.31mL SOSY   NDC: 97416-384-53, Lot: [id number]   Route: Intravitreal, Site: Right Eye  Post-op Post injection exam found visual acuity of at least counting fingers. The patient tolerated the procedure well. There were no complications. The patient received written and verbal post procedure care education.                 ASSESSMENT/PLAN:  Branch retinal vein occlusion with macular edema of right eye Persistent and recurrent in the past with perifoveal CME, preserved acuity yet slightly increased this time at 6 weeks will repeat injection today and follow-up in 6 weeks as per schedule limitations      ICD-10-CM   1. Branch retinal vein occlusion with macular edema of right eye  H34.8310 OCT, Retina - OU - Both Eyes    Intravitreal Injection, Pharmacologic Agent - OD - Right Eye    bevacizumab (AVASTIN) SOSY 2.5 mg    1.  2.  3.  Ophthalmic Meds Ordered this visit:  Meds ordered this encounter  Medications  . bevacizumab (AVASTIN) SOSY 2.5 mg       Return in about 6 weeks (around 10/31/2020) for dilate, OD, AVASTIN OCT.  There are no Patient Instructions on file for this visit.   Explained the diagnoses, plan, and follow up with the patient and they expressed understanding.  Patient expressed understanding of the importance of proper follow up care.   Clent Demark Josephine Rudnick M.D. Diseases & Surgery of the Retina and Vitreous [facility] Di[facility] [place] 09/19/20     Abbreviations: M myopia (nearsighted); A astigmatism; H hyperopia (farsighted); P presbyopia; Mrx spectacle prescription;  CTL contact lenses; OD right eye; OS left eye; OU both eyes  XT exotropia; ET esotropia; PEK punctate epithelial keratitis; PEE punctate epithelial erosions; DES dry eye syndrome; MGD meibomian gland dysfunction; ATs artificial tears; PFAT's preservative free artificial tears; [place] nuclear  sclerotic cataract; PSC posterior subcapsular cataract; ERM epi-retinal membrane; PVD posterior vitreous detachment; RD retinal detachment; DM diabetes mellitus; DR diabetic retinopathy; NPDR non-proliferative diabetic retinopathy; PDR proliferative diabetic retinopathy; CSME clinically significant macular edema; DME diabetic macular edema; dbh dot blot hemorrhages; CWS cotton wool spot; POAG primary open angle glaucoma; C/D cup-to-disc ratio; HVF humphrey visual field; GVF goldmann visual field; OCT optical coherence tomography; IOP intraocular pressure; BRVO Branch retinal vein occlusion; CRVO central retinal vein occlusion; CRAO central retinal artery occlusion; BRAO branch retinal artery occlusion; RT retinal tear; SB scleral buckle; PPV pars plana vitrectomy; VH Vitreous hemorrhage; PRP panretinal laser photocoagulation; IVK intravitreal kenalog; VMT vitreomacular traction; MH Macular hole;  NVD neovascularization of the disc; NVE neovascularization elsewhere; AREDS age related eye disease study; ARMD age related macular degeneration; POAG primary open angle glaucoma; EBMD epithelial/anterior basement membrane dystrophy; ACIOL anterior chamber intraocular  lens; IOL intraocular lens; PCIOL posterior chamber intraocular lens; Phaco/IOL phacoemulsification with intraocular lens placement; [place] photorefractive keratectomy; LASIK laser assisted in situ keratomileusis; HTN hypertension; DM diabetes mellitus; COPD chronic obstructive pulmonary disease

## 2020-09-26 ENCOUNTER — Ambulatory Visit (INDEPENDENT_AMBULATORY_CARE_PROVIDER_SITE_OTHER): Payer: HMO | Admitting: Sports Medicine

## 2020-09-26 ENCOUNTER — Other Ambulatory Visit: Payer: Self-pay

## 2020-09-26 ENCOUNTER — Encounter: Payer: Self-pay | Admitting: Sports Medicine

## 2020-09-26 DIAGNOSIS — M79674 Pain in right toe(s): Secondary | ICD-10-CM

## 2020-09-26 DIAGNOSIS — Z7901 Long term (current) use of anticoagulants: Secondary | ICD-10-CM | POA: Diagnosis not present

## 2020-09-26 DIAGNOSIS — B351 Tinea unguium: Secondary | ICD-10-CM

## 2020-09-26 DIAGNOSIS — E119 Type 2 diabetes mellitus without complications: Secondary | ICD-10-CM

## 2020-09-26 DIAGNOSIS — M79675 Pain in left toe(s): Secondary | ICD-10-CM | POA: Diagnosis not present

## 2020-09-26 NOTE — Progress Notes (Signed)
Subjective: Virginia Huber is a 75 y.o. female patient with history of diabetes who presents to office today complaining of long,mildly painful nails while ambulating in shoes; unable to trim. Admits bleeding at left 1st toenail and soreness at the medial corner like last time. Patient states that the glucose reading this morning was not recorded.  PCP in 2weeks for blood work.   Patient Active Problem List   Diagnosis Date Noted  . Advanced nonexudative age-related macular degeneration of left eye with subfoveal involvement 06/26/2020  . Stable central retinal vein occlusion of left eye 05/13/2020  . Early stage nonexudative age-related macular degeneration of right eye 02/27/2020  . OSA on CPAP 02/12/2020  . Branch retinal vein occlusion with macular edema of right eye 10/11/2019  . Severe nonproliferative diabetic retinopathy of right eye, with macular edema, associated with type 2 diabetes mellitus ([place]) 10/11/2019  . Right retinal defect 10/11/2019  . Posterior vitreous detachment of right eye 10/11/2019  . Retinal microaneurysm of right eye 10/11/2019  . CHF exacerbation ([place]) 08/25/2019  . Acute exacerbation of CHF (congestive heart failure) ([place]) 08/24/2019  . Acute on chronic diastolic HF (heart failure) ([place]) 12/17/2017  . GERD (gastroesophageal reflux disease) 12/17/2017  . Typical atrial flutter ([place])   . 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 Paroxysmal atrial fibrillation ([place]) 12/08/2017  . 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 Chronic renal failure, stage 3b ([place]) 09/23/2017  . Fever 09/23/2017  . S/P pericardiocentesis 06/28/2016  . Acute blood loss anemia 06/28/2016  . Pericardial effusion 06/26/2016  . Tachy-brady syndrome ([place]) 06/25/2016  . Tamponade   . Bradycardia 06/14/2016  . Junctional bradycardia   . Coronary artery disease involving coronary bypass graft of native heart with unstable angina pectoris ([place])   .  Acute diastolic CHF (congestive heart failure), NYHA class 3 ([place])   . Systolic congestive heart failure ([place]) 05/13/2016  . Multiple and bilateral precerebral artery syndromes 05/13/2016  . OSA (obstructive sleep apnea) 05/13/2016  . Chronic diarrhea 12/25/2015  . Chest pain 08/02/2015  . Atrial fibrillation with rapid ventricular response ([place])   . Pain with urination 05/08/2015  . NSTEMI (non-ST elevated myocardial infarction) ([place]) 04/02/2015  . CAD in native artery 11/30/2014  . 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   . PAD (peripheral artery disease) ([place]) 12/26/2013  . Superficial fungus infection of skin 06/29/2013  . UTI (urinary tract infection) 05/08/2013  . Hypokalemia 03/05/2012  . B12 deficiency anemia 03/02/2012  . Bronchospasm 03/02/2012  . Community acquired bacterial pneumonia 03/01/2012  . Acute respiratory failure with hypoxia ([place]) 03/01/2012  . Type 2 diabetes with nephropathy ([place]) 02/29/2012  . Hypothyroidism 02/28/2012  . RLQ abdominal pain 11/24/2010  . OVERWEIGHT/OBESITY 06/03/2010  . Essential hypertension 06/03/2010  . Overweight 06/03/2010  . Mixed hyperlipidemia 12/27/2009  . Palpitations 05/17/2009  . FRACTURE, TOE 12/06/2007   Current Outpatient Medications on File Prior to Visit  Medication Sig Dispense Refill  . acetaminophen (TYLENOL) 325 MG tablet Take 650 mg by mouth every 6 (six) hours as needed for headache.    [name] amiodarone (PACERONE) 200 MG tablet Take 0.5 tablets (100 mg total) by mouth daily. 45 tablet 1  . apixaban (ELIQUIS) 5 MG TABS tablet Take 1 tablet (5 mg total) by mouth 2 (two) times daily. 28 tablet 0  . atorvastatin (LIPITOR) 80 MG tablet TAKE ONE TABLET BY MOUTH EVERY DAY AT 6:00PM 90 tablet  3  . carvedilol (COREG) 12.5 MG tablet Take 1 tablet (12.5 mg total) by mouth 2 (two) times daily. 180 tablet 3  . Cholecalciferol (VITAMIN D3 PO) Take 1 tablet by mouth daily.     .  ciprofloxacin (CIPRO) 500 MG tablet Take ONE tablet by MOUTH in THE morning AND ONE tablet in THE evening. DO ALL this FOR 10 DAYS.    [name] clopidogrel (PLAVIX) 75 MG tablet TAKE ONE TABLET (75MG  TOTAL) BY MOUTH DAILY 90 tablet 3  . Continuous Blood Gluc Receiver (FREESTYLE LIBRE 2 READER) DEVI 1 each by Does not apply route daily. 1 each 0  . Continuous Blood Gluc Sensor (FREESTYLE LIBRE 2 SENSOR) MISC 1 each by Does not apply route every 14 (fourteen) days. 6 each 3  . cyanocobalamin (,VITAMIN B-12,) 1000 MCG/ML injection Inject 1,000 mcg into the muscle every 30 (thirty) days. Last injection was around 08/02/19    . diltiazem (CARDIZEM CD) 360 MG 24 hr capsule Take 1 capsule (360 mg total) by mouth daily. 90 capsule 3  . Dulaglutide (TRULICITY) 1.5 ZO/1.0RU SOPN Inject 1.5 mg into the skin once a week. 6 mL 3  . FREESTYLE LITE test strip 1 each by Other route 2 (two) times daily.     . furosemide (LASIX) 40 MG tablet TAKE ONE AND HALF TABLET (60MG  TOTAL) BY MOUTH IN THE MORNING AND ONE TABLET IN THE EVENING (40MG  TOTAL) 180 tablet 4  . hydrALAZINE (APRESOLINE) 50 MG tablet Take 1 tablet (50 mg total) by mouth 3 (three) times daily. 270 tablet 3  . insulin NPH-regular Human (70-30) 100 UNIT/ML injection Inject 35 units before breakfast and 35 units before supper 45 mL 3  . levothyroxine (SYNTHROID) 137 MCG tablet Take 137 mcg by mouth daily before breakfast.    . nitroGLYCERIN (NITROSTAT) 0.4 MG SL tablet Place 1 tablet (0.4 mg total) under the tongue every 5 (five) minutes as needed for chest pain. 25 tablet 0  . olmesartan (BENICAR) 40 MG tablet Take 40 mg by mouth daily.    . potassium chloride SA (KLOR-CON) 20 MEQ tablet TAKE ONE (1) TABLET BY MOUTH EVERY DAY 90 tablet 1  . rOPINIRole (REQUIP) 0.5 MG tablet Take 0.5 mg by mouth at bedtime.   0   No current facility-administered medications on file prior to visit.   Allergies  Allergen Reactions  . Penicillins Hives    Has patient had a PCN  reaction causing immediate rash, facial/tongue/throat swelling, SOB or lightheadedness with hypotension: Yes Has patient had a PCN reaction causing severe rash involving mucus membranes or skin necrosis: No Has patient had a PCN reaction that required hospitalization No Has patient had a PCN reaction occurring within the last 10 years: No If all of the above answers are "NO", then may proceed with Cephalosporin use.  [name] Percocet [Oxycodone-Acetaminophen] Nausea And Vomiting     Objective: General: Patient is awake, alert, and oriented x 3 and in no acute distress.  Integument: Skin is warm, dry and supple bilateral. Nails are tender, long, thickened and dystrophic with subungual debris, consistent with onychomycosis, 1-5 bilateral. Mild curvature of bilateral hallux nails with dry blood noted to medial left hallux, No signs of infection. No open lesions or preulcerative lesions present bilateral. Remaining integument unremarkable.  Vasculature:  Dorsalis Pedis pulse 1/4 bilateral. Posterior Tibial pulse  0/4 bilateral.  Capillary fill time <3 sec 1-5 bilateral. Positive hair growth to the level of the digits. Temperature gradient within normal limits. No  varicosities present bilateral. Trace edema present bilateral.   Neurology: Gross sensation present via light touch bilateral.  Protective sensation intact with Semmes Weinstein monofilament bilateral.  Musculoskeletal: Asymptomatic bunion and hammertoe with cross over R>L pedal deformities noted bilateral.  Strength within normal limits.  No other acute findings or tenderness to palpation bilateral.  Assessment and Plan: Problem List Items Addressed This Visit   None   Visit Diagnoses    Pain due to onychomycosis of toenails of both feet    -  Primary   Diabetes mellitus without complication ([place])       Anticoagulated         -Examined patient. -Mechanically debrided all nails 1-5 bilateral using sterile nail nipper and filed with  dremel without incident  -Advised epsom and antibiotic cream to left hallux medial margin until soreness has resolved if needed  -Advised good supportive shoes daily for foot type -Answered all patient questions -Patient to return  in 3 months for at risk foot care -Patient advised to call the office if any problems or questions arise in the meantime.  Landis Martins, DPM

## 2020-09-30 DIAGNOSIS — J302 Other seasonal allergic rhinitis: Secondary | ICD-10-CM | POA: Diagnosis not present

## 2020-10-08 ENCOUNTER — Ambulatory Visit (INDEPENDENT_AMBULATORY_CARE_PROVIDER_SITE_OTHER): Payer: HMO

## 2020-10-08 DIAGNOSIS — I495 Sick sinus syndrome: Secondary | ICD-10-CM

## 2020-10-08 LAB — CUP PACEART REMOTE DEVICE CHECK
Battery Remaining Longevity: 75 mo
Battery Voltage: 3.01 V
Brady Statistic AP VP Percent: 0.01 %
Brady Statistic AP VS Percent: 31.83 %
Brady Statistic AS VP Percent: 0.07 %
Brady Statistic AS VS Percent: 68.09 %
Brady Statistic RA Percent Paced: 31.83 %
Brady Statistic RV Percent Paced: 0.08 %
Date Time Interrogation Session: 20220419092940
Implantable Lead Implant Date: 20180104
Implantable Lead Implant Date: 20180104
Implantable Lead Location: 753859
Implantable Lead Location: 753860
Implantable Lead Model: 5076
Implantable Lead Model: 5076
Implantable Pulse Generator Implant Date: 20180104
Lead Channel Impedance Value: 342 Ohm
Lead Channel Impedance Value: 399 Ohm
Lead Channel Impedance Value: 399 Ohm
Lead Channel Impedance Value: 475 Ohm
Lead Channel Pacing Threshold Amplitude: 0.625 V
Lead Channel Pacing Threshold Amplitude: 1.125 V
Lead Channel Pacing Threshold Pulse Width: 0.4 ms
Lead Channel Pacing Threshold Pulse Width: 0.4 ms
Lead Channel Sensing Intrinsic Amplitude: 1.5 mV
Lead Channel Sensing Intrinsic Amplitude: 1.5 mV
Lead Channel Sensing Intrinsic Amplitude: 12.375 mV
Lead Channel Sensing Intrinsic Amplitude: 12.375 mV
Lead Channel Setting Pacing Amplitude: 2 V
Lead Channel Setting Pacing Amplitude: 2.5 V
Lead Channel Setting Pacing Pulse Width: 0.4 ms
Lead Channel Setting Sensing Sensitivity: 2.8 mV

## 2020-10-22 ENCOUNTER — Other Ambulatory Visit: Payer: Self-pay

## 2020-10-22 ENCOUNTER — Ambulatory Visit (INDEPENDENT_AMBULATORY_CARE_PROVIDER_SITE_OTHER): Payer: HMO | Admitting: Internal Medicine

## 2020-10-22 ENCOUNTER — Encounter: Payer: Self-pay | Admitting: Internal Medicine

## 2020-10-22 VITALS — BP 120/78 | HR 76 | Ht 63.0 in | Wt 154.4 lb

## 2020-10-22 DIAGNOSIS — E1165 Type 2 diabetes mellitus with hyperglycemia: Secondary | ICD-10-CM | POA: Diagnosis not present

## 2020-10-22 DIAGNOSIS — E039 Hypothyroidism, unspecified: Secondary | ICD-10-CM

## 2020-10-22 DIAGNOSIS — E1159 Type 2 diabetes mellitus with other circulatory complications: Secondary | ICD-10-CM

## 2020-10-22 DIAGNOSIS — E785 Hyperlipidemia, unspecified: Secondary | ICD-10-CM | POA: Diagnosis not present

## 2020-10-22 LAB — POCT GLYCOSYLATED HEMOGLOBIN (HGB A1C): Hemoglobin A1C: 7.4 % — AB (ref 4.0–5.6)

## 2020-10-22 LAB — T4, FREE: Free T4: 1.4 ng/dL (ref 0.60–1.60)

## 2020-10-22 LAB — TSH: TSH: 0.22 u[IU]/mL — ABNORMAL LOW (ref 0.35–4.50)

## 2020-10-22 MED ORDER — INSULIN NPH ISOPHANE & REGULAR (70-30) 100 UNIT/ML ~~LOC~~ SUSP: SUBCUTANEOUS | 3 refills | Status: DC

## 2020-10-22 MED ORDER — TRULICITY 3 MG/0.5ML ~~LOC~~ SOAJ: 3.0000 mg | SUBCUTANEOUS | 3 refills | Status: DC

## 2020-10-22 MED ORDER — LEVOTHYROXINE SODIUM 125 MCG PO TABS: 125.0000 ug | ORAL_TABLET | Freq: Every day | ORAL | 3 refills | Status: DC

## 2020-10-22 NOTE — Progress Notes (Signed)
Patient ID: Virginia Huber, female   DOB: Sep 25, 1945, 75 y.o.   MRN: [id number]   This visit occurred during the SARS-CoV-2 public health emergency.  Safety protocols were in place, including screening questions prior to the visit, additional usage of staff PPE, and extensive cleaning of exam room while observing appropriate contact time as indicated for disinfecting solutions.    HPI: Virginia Huber is a 75 y.o.-year-old female, initially referred by her PCP, Dr.Golding, returning for follow-up for DM2, dx in ~2005, insulin-dependent since 2006, uncontrolled, with complications (CAD, s/p NSTEMI, s/p PTCA and DES; CHF; PAF/flutter; PAD; CKD; cerebrovascular disease - s/p TIA; PN).  She previously saw my colleague, Dr. Loanne Drilling.  Our first visit was 3 months ago.  Interim history: She had 2 UTIs (02 and 08/2020) and a URI (09/2020). At today's visit, she does have blurry vision, chronic, gets intraocular injections), however, no GI symptoms, significant weight changes, or increased urination.    DM2: Reviewed HbA1c levels: Lab Results  Component Value Date   HGBA1C 6.7 (A) 07/23/2020   HGBA1C 7.3 (A) 04/15/2020   HGBA1C 8.7 (A) 02/14/2020   HGBA1C 7.7 12/04/2019   HGBA1C 7.0 (H) 08/24/2019   HGBA1C 6.8 (H) 12/17/2017   HGBA1C 6.6 (H) 08/05/2017   HGBA1C 6.9 (H) 04/30/2017   HGBA1C 6.9 (H) 01/25/2017   HGBA1C 7.1 (H) 10/22/2016   Pt is on a regimen of: - NPH/R 70/30 insulin (vial) 40 >> 35-36 units twice a day >> 30 units twice a day 30 minutes before brunch and dinner - Trulicity 5.02 mg weekly - started 01/2020 >> 1.5 mg weekly - increased 03/2020 -tolerated well No FH of MTC or personal h/o pancreatitis. She was on Metformin >> stopped b/c CKD.  Pt checks her sugars 4x a day with the Libre 2 CGM - forgot the receiver - am: 96-166, 180 >>78,  92-208 >> 92-139 >> 58, 62, 102-186, 200 - 2h after b'fast: n/c - before lunch: n/c - 2h after lunch: n/c >> 96 >> 130, 150 - before  dinner: 76, 84-154, 182 >> 87-177 >> 87-132 >> 63-150, 168, 170 - 2h after dinner: n/c - bedtime: n/c >> 66, 72, 116, 138 >>  - nighttime: n/c Lowest sugar was 60s (after dinner) >> 66 (before decreasing insulin dose) >> 58 (Cipro); she has hypoglycemia awareness in the 70s.  Highest sugar was upper 200s >> 208 >> 200.  Glucometer: Freestyle lite  Patient starting to improve her meals after her last HbA1c returned: - Brunch: scrambled egg + bacon + rye bread or cheerios + skim milk - Dinner: meat - grilled + veggies - Snacks: seldom - apple Drinks occasional diet drinks  -+ CKD- Dr. Theador Hawthorne, last BUN/creatinine:  Lab Results  Component Value Date   BUN 30 (H) 08/22/2020   BUN 24 (A) 12/04/2019   CREATININE 1.79 (H) 08/22/2020   CREATININE 1.5 (A) 12/04/2019  06/24/2020: BUN/creatinine 24/1.7 On Benicar 40.  -+ HL; last set of lipids: 07/17/2020: 121/146/38/58 Lab Results  Component Value Date   CHOL 166 08/05/2017   HDL 43 (L) 08/05/2017   LDLCALC 96 08/05/2017   TRIG 160 (H) 08/05/2017   CHOLHDL 3.9 08/05/2017  On Lipitor 80.  - last eye exam was on 06/26/2020: + DR. Dr Zadie Rhine.  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  - she has numbness and tingling in her feet.  Pt has FH of DM in M and her family.  She also has a history of hypothyroidism:  Pt is on levothyroxine 137 mcg daily, taken: - in am - fasting - at least 30 min from b'fast - no calcium - no iron - no multivitamins - no PPIs - not on Biotin  TSH levels were reviewed and the TSH was suppressed: Lab Results  Component Value Date   TSH 0.233 (L) 08/22/2020   TSH 2.410 02/22/2020   TSH 0.424 (L) 08/15/2019   TSH 0.059 (L) 12/17/2017   TSH 0.05 (L) 12/06/2017   TSH 11.574 (H) 09/23/2017   TSH 12.65 (H) 08/05/2017   TSH 4.97 (H) 04/30/2017   TSH 5.64 (H) 01/25/2017   TSH 1.53 10/22/2016   She has an extensive cardiac history.  Besides mentioned in HPI, she also  has a history of heart block, tachybradycardia syndrome, pericardial effusion (history of tamponade).  She is on amiodarone, Eliquis, Plavix, Lasix.    ROS: Constitutional: no weight gain/no weight loss, no fatigue, no subjective hyperthermia, no subjective hypothermia Eyes: + blurry vision, no xerophthalmia ENT: no sore throat, no nodules palpated in neck, no dysphagia, no odynophagia, no hoarseness Cardiovascular: no CP/no SOB/no palpitations/no leg swelling Respiratory: no cough/no SOB/no wheezing Gastrointestinal: no N/no V/no D/no C/no acid reflux Musculoskeletal: no muscle aches/no joint aches Skin: no rashes, no hair loss Neurological: no tremors/no numbness/no tingling/no dizziness  I reviewed pt's medications, allergies, PMH, social hx, family hx, and changes were documented in the history of present illness. Otherwise, unchanged from my initial visit note.   Past Medical History:  Diagnosis Date  . Acute blood loss anemia 06/28/2016  . Acute diastolic CHF (congestive heart failure), NYHA class 3 ([place])   . Acute exacerbation of CHF (congestive heart failure) ([place]) 08/24/2019  . Acute respiratory failure with hypoxia ([place]) 03/01/2012  . Advanced nonexudative age-related macular degeneration of left eye with subfoveal involvement 06/26/2020   Ongoing, accounts for acuity  . 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 Arthritis   . Atrial fibrillation and flutter ([place])    a. h/o PAF/flutter during admission in 2013 for PNA. b. PAF during adm for NSTEMI 07/2015, subsequent paroxysms since then.  . Atrial fibrillation with rapid ventricular response ([place])   . B12 deficiency anemia   . Bradycardia 06/14/2016  . Branch retinal vein occlusion with macular edema of right eye 10/11/2019  . Bronchospasm 03/02/2012  . CAD in native artery 11/30/2014  . Chronic renal failure, stage 3b ([place]) 09/23/2017  . Coronary artery disease 11/30/2014   a. remote MI. b. h/o PTCA with scoring balloon to  OM1 11/2014. c. NSTEMI 03/2015 s/p DES to prox-mid Cx. d. NSTEMI 07/2015 s/p scoring balloon/PTCA/DES to dRCA with PAF during that admission  . Coronary artery disease involving coronary bypass graft of native heart with unstable angina pectoris ([place])   . 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   . Cutaneous lupus erythematosus   . Early stage nonexudative age-related macular degeneration of right eye 02/27/2020  . Essential hypertension   . FRACTURE, TOE 12/06/2007   Qualifier: Diagnosis of  By: Aline Brochure MD, Dorothyann Peng    . GERD (gastroesophageal reflux disease)   . History of blood transfusion 1980's   2nd surgical procedures  . Hypercholesteremia   . Hypokalemia 03/05/2012  . Hypothyroidism   . Multiple and bilateral precerebral artery syndromes 05/13/2016  . Myocardial infarction ([place]) 02/2012  . NSTEMI (non-ST elevated myocardial infarction) ([place]) 04/02/2015  . OSA (obstructive sleep apnea) 05/13/2016  .  Ovarian tumor   . PAD (peripheral artery disease) ([place])    a. s/p LE angio 2015; followed by Dr. Fletcher Anon - managed medically.  . Pain with urination 05/08/2015  . Paroxysmal atrial fibrillation ([place]) 12/08/2017  . Pericardial effusion    a. 06/2016 after ppm - s/p pericardiocentesis.  [name] Posterior vitreous detachment of right eye 10/11/2019  . Retinal microaneurysm of right eye 10/11/2019  . Right retinal defect 10/11/2019  . RLQ abdominal pain 11/24/2010  . S/P pericardiocentesis 06/28/2016  . 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 Stable central retinal vein occlusion of left eye 05/13/2020  . Superficial fungus infection of skin 06/29/2013  . Systolic congestive heart failure ([place]) 05/13/2016  . Tachy-brady syndrome ([place])    a. s/p Medtronic PPM 06/2016, c/b lead perf/pericardial effusion.  . Tamponade   . TIA (transient ischemic attack)   . Type 2 diabetes with nephropathy ([place]) 02/29/2012  . Type II diabetes  mellitus ([place])   . Typical atrial flutter ([place])   . UTI (urinary tract infection) 05/08/2013   Past Surgical History:  Procedure Laterality Date  . ABDOMINAL AORTAGRAM N/A 01/03/2014   Procedure: ABDOMINAL [name];  Surgeon: Wellington Hampshire, MD;  Location: [facility] CATH LAB;  Service: Cardiovascular;  Laterality: N/A;  . ABDOMINAL HYSTERECTOMY  1972   "partial"  . APPENDECTOMY  1970's  . CARDIAC CATHETERIZATION  2008   Tiny OM-2 with 90% narrowing. Med tx.  [name] CARDIAC CATHETERIZATION N/A 11/30/2014   Procedure: Left Heart Cath and Coronary Angiography;  Surgeon: Troy Sine, MD; LAD 20%, CFX 50%, OM1 95%, right PLB 30%, LV normal   . CARDIAC CATHETERIZATION N/A 11/30/2014   Procedure: Coronary Balloon Angioplasty;  Surgeon: Troy Sine, MD;  Angiosculpt scoring balloon and PTCA to the OM1 reducing stenosis from 95% to less than 10%  . CARDIAC CATHETERIZATION N/A 04/03/2015   Procedure: Left Heart Cath and Coronary Angiography;  Surgeon: Jolaine Artist, MD; dLAD 50%, CFX 90%, OM1 100%, PLA 15%, LVEDP 13    . CARDIAC CATHETERIZATION N/A 04/03/2015   Procedure: Coronary Stent Intervention;  Surgeon: Sherren Mocha, MD; 3.0x18 mm Xience DES to the CFX    . CARDIAC CATHETERIZATION N/A 08/02/2015   Procedure: Left Heart Cath and Coronary Angiography;  Surgeon: Troy Sine, MD;  Location: [facility] CV LAB;  Service: Cardiovascular;  Laterality: N/A;  . CARDIAC CATHETERIZATION N/A 08/02/2015   Procedure: Coronary Stent Intervention;  Surgeon: Troy Sine, MD;  Location: [facility] CV LAB;  Service: Cardiovascular;  Laterality: N/A;  . CARDIAC CATHETERIZATION N/A 06/25/2016   Procedure: Pericardiocentesis;  Surgeon: Will Meredith Leeds, MD;  Location: [facility] CV LAB;  Service: Cardiovascular;  Laterality: N/A;  . cardiac stents    . CARDIOVERSION N/A 12/15/2017   Procedure: CARDIOVERSION;  Surgeon: Acie Fredrickson Wonda Cheng, MD;  Location: [facility] ENDOSCOPY;  Service: Cardiovascular;  Laterality:  N/A;  . CHOLECYSTECTOMY OPEN  1990's  . COLONOSCOPY  2005   Dr. Laural Golden: pancolonic divericula, polyp, path unknown currently  . COLONOSCOPY  2012   Dr. Oneida Alar: Normal TI, scattered diverticula in entire colon, small internal hemorrhoids, normal colon biopsies. Colonoscopy in 5-10 years.   . COLOSTOMY  05/1979  . COLOSTOMY REVERSAL  11/1979  . EP IMPLANTABLE DEVICE N/A 06/25/2016   Procedure: Lead Revision/Repair;  Surgeon: Will Meredith Leeds, MD;  Location: [place] CV LAB;  Service: Cardiovascular;  Laterality: N/A;  . EP IMPLANTABLE DEVICE N/A 06/25/2016   Procedure: Pacemaker  Implant;  Surgeon: Will Meredith Leeds, MD;  Location: [facility] LAB;  Service: Cardiovascular;  Laterality: N/A;  . EXCISIONAL HEMORRHOIDECTOMY  1970's  . EYE SURGERY Left 2000   "branch vein occlusion"  . EYE SURGERY Left ~ 2001   "smoothed out wrinkle"  . LEFT OOPHORECTOMY  05/1979   nicked bowel, peritonitis, colostomy; colostomy reversed 1981   . LOWER EXTREMITY ANGIOGRAM N/A 01/03/2014   Procedure: LOWER EXTREMITY ANGIOGRAM;  Surgeon: Wellington Hampshire, MD;  Location: [facility] CATH LAB;  Service: Cardiovascular;  Laterality: N/A;  . Nuclear med stress test  10/2011   Small area of mild ischemia inferoapically.  [name] PARTIAL HYSTERECTOMY  1970's   left ovaries, then ovaries removed later due tumors   . RIGHT OOPHORECTOMY  1970's   Social History   Socioeconomic History  . Marital status: Married    Spouse name: Not on file  . Number of children: Not on file  . Years of education: Not on file  . Highest education level: Not on file  Occupational History  . Occupation: Retired    Fish farm manager: RETIRED    Comment: Research officer, political party  Tobacco Use  . Smoking status: Never Smoker  . Smokeless tobacco: Never Used  . Tobacco comment: Never smoked  Vaping Use  . Vaping Use: Never used  Substance and Sexual Activity  . Alcohol use: No    Alcohol/week: 0.0 standard drinks  . Drug use: No  . Sexual activity:  Never    Birth control/protection: Surgical    Comment: hyst  Other Topics Concern  . Not on file  Social History Narrative  . Not on file   Social Determinants of Health   Financial Resource Strain: Not on file  Food Insecurity: Not on file  Transportation Needs: Not on file  Physical Activity: Not on file  Stress: Not on file  Social Connections: Not on file  Intimate Partner Violence: Not on file   Current Outpatient Medications on File Prior to Visit  Medication Sig Dispense Refill  . acetaminophen (TYLENOL) 325 MG tablet Take 650 mg by mouth every 6 (six) hours as needed for headache.    [name] amiodarone (PACERONE) 200 MG tablet Take 0.5 tablets (100 mg total) by mouth daily. 45 tablet 1  . apixaban (ELIQUIS) 5 MG TABS tablet Take 1 tablet (5 mg total) by mouth 2 (two) times daily. 28 tablet 0  . atorvastatin (LIPITOR) 80 MG tablet TAKE ONE TABLET BY MOUTH EVERY DAY AT 6:00PM 90 tablet 3  . carvedilol (COREG) 12.5 MG tablet Take 1 tablet (12.5 mg total) by mouth 2 (two) times daily. 180 tablet 3  . Cholecalciferol (VITAMIN D3 PO) Take 1 tablet by mouth daily.     . ciprofloxacin (CIPRO) 500 MG tablet Take ONE tablet by MOUTH in THE morning AND ONE tablet in THE evening. DO ALL this FOR 10 DAYS.    [name] clopidogrel (PLAVIX) 75 MG tablet TAKE ONE TABLET (75MG  TOTAL) BY MOUTH DAILY 90 tablet 3  . Continuous Blood Gluc Receiver (FREESTYLE LIBRE 2 READER) DEVI 1 each by Does not apply route daily. 1 each 0  . Continuous Blood Gluc Sensor (FREESTYLE LIBRE 2 SENSOR) MISC 1 each by Does not apply route every 14 (fourteen) days. 6 each 3  . cyanocobalamin (,VITAMIN B-12,) 1000 MCG/ML injection Inject 1,000 mcg into the muscle every 30 (thirty) days. Last injection was around 08/02/19    . diltiazem (CARDIZEM CD) 360 MG 24 hr capsule Take 1 capsule (  360 mg total) by mouth daily. 90 capsule 3  . Dulaglutide (TRULICITY) 1.76 YO/3.7CH SOPN Inject 1.5 mg into the skin once a week. 6 mL 3  . FREESTYLE  LITE test strip 1 each by Other route 2 (two) times daily.     . furosemide (LASIX) 40 MG tablet TAKE ONE AND HALF TABLET (60MG  TOTAL) BY MOUTH IN THE MORNING AND ONE TABLET IN THE EVENING (40MG  TOTAL) 180 tablet 4  . hydrALAZINE (APRESOLINE) 50 MG tablet Take 1 tablet (50 mg total) by mouth 3 (three) times daily. 270 tablet 3  . insulin NPH-regular Human (70-30) 100 UNIT/ML injection Inject 35 units before breakfast and 35 units before supper 45 mL 3  . levothyroxine (SYNTHROID) 137 MCG tablet Take 137 mcg by mouth daily before breakfast.    . nitroGLYCERIN (NITROSTAT) 0.4 MG SL tablet Place 1 tablet (0.4 mg total) under the tongue every 5 (five) minutes as needed for chest pain. 25 tablet 0  . olmesartan (BENICAR) 40 MG tablet Take 40 mg by mouth daily.    . potassium chloride SA (KLOR-CON) 20 MEQ tablet TAKE ONE (1) TABLET BY MOUTH EVERY DAY 90 tablet 1  . rOPINIRole (REQUIP) 0.5 MG tablet Take 0.5 mg by mouth at bedtime.   0   No current facility-administered medications on file prior to visit.   Allergies  Allergen Reactions  . Penicillins Hives    Has patient had a PCN reaction causing immediate rash, facial/tongue/throat swelling, SOB or lightheadedness with hypotension: Yes Has patient had a PCN reaction causing severe rash involving mucus membranes or skin necrosis: No Has patient had a PCN reaction that required hospitalization No Has patient had a PCN reaction occurring within the last 10 years: No If all of the above answers are "NO", then may proceed with Cephalosporin use.  [name] Percocet [Oxycodone-Acetaminophen] Nausea And Vomiting   Family History  Problem Relation Age of Onset  . Heart disease Mother        deceased  . Heart disease Father        deceased, heart disease  . Diabetes Brother   . Heart disease Brother   . Thyroid disease Brother   . Heart disease Sister   . Heart disease Brother   . Thyroid disease Brother   . Lupus Daughter   . Colon cancer Neg Hx     PE: BP 120/78 (BP Location: Left Arm, Patient Position: Sitting, Cuff Size: Normal)   Pulse 76   Ht 5\' 3"  (1.6 m)   Wt 154 lb 6.4 oz (70 kg)   SpO2 97%   BMI 27.35 kg/m  Wt Readings from Last 3 Encounters:  10/22/20 154 lb 6.4 oz (70 kg)  08/22/20 154 lb (69.9 kg)  08/07/20 154 lb 1.6 oz (69.9 kg)   Constitutional: Slightly overweight, in NAD Eyes: PERRLA, EOMI, no exophthalmos ENT: moist mucous membranes, no thyromegaly, no cervical lymphadenopathy Cardiovascular: RRR, No MRG Respiratory: CTA B Gastrointestinal: abdomen soft, NT, ND, BS+ Musculoskeletal: no deformities, strength intact in all 4 Skin: moist, warm, no rashes Neurological: no tremor with outstretched hands, DTR normal in all 4  ASSESSMENT: 1. DM2, insulin-dependent, uncontrolled, withcomplications - CAD, s/p NSTEMI 07/2015 and 03/2015, s/p PTCA and DES - CHF - PAF/flutter - PAD - cerebrovascular disease - s/p TIA - CKD  2. HL  3.  Hypothyroidism  PLAN:  1. Patient with longstanding, uncontrolled, type 2 diabetes, on premixed insulin regimen and also weekly GLP-1 receptor agonist with improved control  at last visit and an HbA1c of 6.7%.  At that time, sugars were almost all at goal, especially improved in the previous 2 weeks.  She had slightly low blood sugars in the evening, after which she decrease the dose of premixed insulin.  I gave her samples of Trulicity at that time as she was in the donut hole and this was expensive.  Otherwise, we did not change her regimen. -In the past, I also advised her to discuss with her nephrologist whether we could use an SGLT2 inhibitor.  She saw Dr. Theador Hawthorne in 04/2020 and he wanted [place] to hold off using an SGLT2 inhibitor as her kidney function was trending down.  Since then, however, she saw him again on 06/25/2020, but the topic was not addressed again.  Her kidney function at that time was better.  She had another kidney function in 08/2020, fairly stable. -At today's  visit, she has the freestyle libre CGM but forgot the receiver.  However, she does bring down her blood sugars and I reviewed her logs.  Sugars appear to be more fluctuating, with the lowest being at 58 in the morning, while she was on ciprofloxacin for UTI.  Aside for this low blood sugar, she had 1 other blood sugar at 62 in the morning and 1 at 63 before dinner.  Otherwise, sugars are mostly at goal with occasional hyperglycemic values, but overall appear to be higher than before. -At this visit, I suggested to decrease the dose of a.m. premixed insulin and increase the Trulicity dose, since she is tolerating it well.  This allows [place] to control her blood sugars better throughout the day without the increased risk for hypoglycemia.   - I suggested to:  Patient Instructions  Please decrease: - NPH/R 70/30 20-22 units in am and 28-30 units before dinner  Increase: - Trulicity 3 mg weekly  Please return in 3-4 months with your sugar log.   - we checked her HbA1c: 7.4% (higher) - advised to check sugars at different times of the day - 2-3x a day, rotating check times - advised for yearly eye exams >> she is UTD - return to clinic in 3-4 months  2. HL -Reviewed latest lipid panel from 06/2020: 121/146/38/58 -LDL at goal, HDL slightly low -Continues Lipitor 80 without side effects  3.  Hypothyroidism - latest thyroid labs reviewed with pt >> normal: Lab Results  Component Value Date   TSH 0.233 (L) 08/22/2020   - she continues on LT4 137 mcg daily - pt feels good on this dose. - we discussed about taking the thyroid hormone every day, with water, >30 minutes before breakfast, separated by >4 hours from acid reflux medications, calcium, iron, multivitamins. Pt. is taking it correctly. - will check thyroid tests today: TSH and fT4 and decrease the LT4 dose if TSH is still suppressed. - If labs are abnormal, she will need to return for repeat TFTs in 1.5 months   Component     Latest Ref  Rng & Units 10/22/2020  TSH     0.35 - 4.50 uIU/mL 0.22 (L)  T4,Free(Direct)     0.60 - 1.60 ng/dL 1.40   TSH is still suppressed.  We will go ahead and decrease the dose of her levothyroxine to 125 mcg daily and repeat the TFTs at next visit.  Philemon Kingdom, MD PhD [facility] Endocrinology

## 2020-10-22 NOTE — Patient Instructions (Signed)
Please decrease: - NPH/R 70/30 20-22 units in am and 28-30 units before dinner  Increase: - Trulicity 3 mg weekly  Please return in 3-4 months with your sugar log.

## 2020-10-23 DIAGNOSIS — I5032 Chronic diastolic (congestive) heart failure: Secondary | ICD-10-CM | POA: Diagnosis not present

## 2020-10-23 DIAGNOSIS — E559 Vitamin D deficiency, unspecified: Secondary | ICD-10-CM | POA: Diagnosis not present

## 2020-10-23 DIAGNOSIS — N390 Urinary tract infection, site not specified: Secondary | ICD-10-CM | POA: Diagnosis not present

## 2020-10-23 DIAGNOSIS — N189 Chronic kidney disease, unspecified: Secondary | ICD-10-CM | POA: Diagnosis not present

## 2020-10-23 DIAGNOSIS — E872 Acidosis: Secondary | ICD-10-CM | POA: Diagnosis not present

## 2020-10-23 DIAGNOSIS — E1122 Type 2 diabetes mellitus with diabetic chronic kidney disease: Secondary | ICD-10-CM | POA: Diagnosis not present

## 2020-10-23 DIAGNOSIS — E211 Secondary hyperparathyroidism, not elsewhere classified: Secondary | ICD-10-CM | POA: Diagnosis not present

## 2020-10-23 DIAGNOSIS — I129 Hypertensive chronic kidney disease with stage 1 through stage 4 chronic kidney disease, or unspecified chronic kidney disease: Secondary | ICD-10-CM | POA: Diagnosis not present

## 2020-10-23 DIAGNOSIS — E1129 Type 2 diabetes mellitus with other diabetic kidney complication: Secondary | ICD-10-CM | POA: Diagnosis not present

## 2020-10-23 DIAGNOSIS — R809 Proteinuria, unspecified: Secondary | ICD-10-CM | POA: Diagnosis not present

## 2020-10-25 NOTE — Progress Notes (Signed)
Remote pacemaker transmission.   

## 2020-10-28 ENCOUNTER — Ambulatory Visit (INDEPENDENT_AMBULATORY_CARE_PROVIDER_SITE_OTHER): Payer: HMO | Admitting: Ophthalmology

## 2020-10-28 ENCOUNTER — Other Ambulatory Visit: Payer: Self-pay

## 2020-10-28 ENCOUNTER — Encounter (INDEPENDENT_AMBULATORY_CARE_PROVIDER_SITE_OTHER): Payer: Self-pay | Admitting: Ophthalmology

## 2020-10-28 DIAGNOSIS — H34831 Tributary (branch) retinal vein occlusion, right eye, with macular edema: Secondary | ICD-10-CM | POA: Diagnosis not present

## 2020-10-28 DIAGNOSIS — H43811 Vitreous degeneration, right eye: Secondary | ICD-10-CM | POA: Diagnosis not present

## 2020-10-28 MED ORDER — BEVACIZUMAB 2.5 MG/0.1ML IZ SOSY
2.5000 mg | PREFILLED_SYRINGE | INTRAVITREAL | Status: AC | PRN
  Administered 2020-10-28: 2.5 mg via INTRAVITREAL

## 2020-10-28 NOTE — Assessment & Plan Note (Signed)
Much less perifoveal CME at 5.5 weeks post recent evaluation and injection intravitreal Avastin, repeat today and examination again in 5 to 6 weeks

## 2020-10-28 NOTE — Progress Notes (Signed)
10/28/2020     CHIEF COMPLAINT Patient presents for Retina Follow Up (5 Wk F/U OD, poss Avastin OD//Pt denies noticeable changes to [place] OU since last visit. Pt denies ocular pain, flashes of light, or floaters OU. //)   HISTORY OF PRESENT ILLNESS: Virginia Huber is a 75 y.o. female who presents to the clinic today for:   HPI    Retina Follow Up    Diagnosis: CRVO/BRVO   Laterality: right eye   Onset: 5 weeks ago   Severity: mild   Duration: 5 weeks   Course: stable   Comments: 5 Wk F/U OD, poss Avastin OD  Pt denies noticeable changes to [place] OU since last visit. Pt denies ocular pain, flashes of light, or floaters OU.          Last edited by Rockie Neighbours, Cochran on 10/28/2020  9:48 AM. (History)      Referring physician: Sharilyn Sites, MD [address] [place],  [place] [address]  HISTORICAL INFORMATION:   Selected notes from the MEDICAL RECORD NUMBER    Lab Results  Component Value Date   HGBA1C 7.4 (A) 10/22/2020     CURRENT MEDICATIONS: No current outpatient medications on file. (Ophthalmic Drugs)   No current facility-administered medications for this visit. (Ophthalmic Drugs)   Current Outpatient Medications (Other)  Medication Sig  . acetaminophen (TYLENOL) 325 MG tablet Take 650 mg by mouth every 6 (six) hours as needed for headache.  [name] amiodarone (PACERONE) 200 MG tablet Take 0.5 tablets (100 mg total) by mouth daily.  [name] apixaban (ELIQUIS) 5 MG TABS tablet Take 1 tablet (5 mg total) by mouth 2 (two) times daily.  [name] atorvastatin (LIPITOR) 80 MG tablet TAKE ONE TABLET BY MOUTH EVERY DAY AT 6:00PM  . carvedilol (COREG) 12.5 MG tablet Take 1 tablet (12.5 mg total) by mouth 2 (two) times daily. (Patient taking differently: Take 12.5 mg by mouth 2 (two) times daily. 1/2 tablet 2 (two) times daily)  . Cholecalciferol (VITAMIN D3 PO) Take 1 tablet by mouth daily.   . ciprofloxacin (CIPRO) 500 MG tablet Take ONE tablet by MOUTH in THE morning AND ONE tablet in  THE evening. DO ALL this FOR 10 DAYS.  [name] clopidogrel (PLAVIX) 75 MG tablet TAKE ONE TABLET (75MG  TOTAL) BY MOUTH DAILY  . Continuous Blood Gluc Receiver (FREESTYLE LIBRE 2 READER) DEVI 1 each by Does not apply route daily.  . Continuous Blood Gluc Sensor (FREESTYLE LIBRE 2 SENSOR) MISC 1 each by Does not apply route every 14 (fourteen) days.  . cyanocobalamin (,VITAMIN B-12,) 1000 MCG/ML injection Inject 1,000 mcg into the muscle every 30 (thirty) days. Last injection was around 08/02/19  . diltiazem (CARDIZEM CD) 360 MG 24 hr capsule Take 1 capsule (360 mg total) by mouth daily.  . Dulaglutide (TRULICITY) 3 FY/1.0FB SOPN Inject 3 mg into the skin once a week.  [name] FREESTYLE LITE test strip 1 each by Other route 2 (two) times daily.   . furosemide (LASIX) 40 MG tablet TAKE ONE AND HALF TABLET (60MG  TOTAL) BY MOUTH IN THE MORNING AND ONE TABLET IN THE EVENING (40MG  TOTAL)  . hydrALAZINE (APRESOLINE) 50 MG tablet Take 1 tablet (50 mg total) by mouth 3 (three) times daily. (Patient taking differently: Take 50 mg by mouth 2 (two) times daily.)  . insulin NPH-regular Human (70-30) 100 UNIT/ML injection Inject 20-30 units 2x a day before meals  . levothyroxine (SYNTHROID) 125 MCG tablet Take 1 tablet (125 mcg total) by  mouth daily.  . nitroGLYCERIN (NITROSTAT) 0.4 MG SL tablet Place 1 tablet (0.4 mg total) under the tongue every 5 (five) minutes as needed for chest pain.  [name] olmesartan (BENICAR) 40 MG tablet Take 40 mg by mouth daily.  . potassium chloride SA (KLOR-CON) 20 MEQ tablet TAKE ONE (1) TABLET BY MOUTH EVERY DAY  . rOPINIRole (REQUIP) 0.5 MG tablet Take 0.5 mg by mouth at bedtime.    No current facility-administered medications for this visit. (Other)      REVIEW OF SYSTEMS:    ALLERGIES Allergies  Allergen Reactions  . Penicillins Hives    Has patient had a PCN reaction causing immediate rash, facial/tongue/throat swelling, SOB or lightheadedness with hypotension: Yes Has patient had  a PCN reaction causing severe rash involving mucus membranes or skin necrosis: No Has patient had a PCN reaction that required hospitalization No Has patient had a PCN reaction occurring within the last 10 years: No If all of the above answers are "NO", then may proceed with Cephalosporin use.  [name] Percocet [Oxycodone-Acetaminophen] Nausea And Vomiting    PAST MEDICAL HISTORY Past Medical History:  Diagnosis Date  . Acute blood loss anemia 06/28/2016  . Acute diastolic CHF (congestive heart failure), NYHA class 3 ([place])   . Acute exacerbation of CHF (congestive heart failure) ([place]) 08/24/2019  . Acute respiratory failure with hypoxia ([place]) 03/01/2012  . Advanced nonexudative age-related macular degeneration of left eye with subfoveal involvement 06/26/2020   Ongoing, accounts for acuity  . 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 Arthritis   . Atrial fibrillation and flutter ([place])    a. h/o PAF/flutter during admission in 2013 for PNA. b. PAF during adm for NSTEMI 07/2015, subsequent paroxysms since then.  . Atrial fibrillation with rapid ventricular response ([place])   . B12 deficiency anemia   . Bradycardia 06/14/2016  . Branch retinal vein occlusion with macular edema of right eye 10/11/2019  . Bronchospasm 03/02/2012  . CAD in native artery 11/30/2014  . Chronic renal failure, stage 3b ([place]) 09/23/2017  . Coronary artery disease 11/30/2014   a. remote MI. b. h/o PTCA with scoring balloon to OM1 11/2014. c. NSTEMI 03/2015 s/p DES to prox-mid Cx. d. NSTEMI 07/2015 s/p scoring balloon/PTCA/DES to dRCA with PAF during that admission  . Coronary artery disease involving coronary bypass graft of native heart with unstable angina pectoris ([place])   . 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   . Cutaneous lupus erythematosus   . Early stage nonexudative age-related macular degeneration of right eye 02/27/2020  . Essential hypertension   . FRACTURE, TOE  12/06/2007   Qualifier: Diagnosis of  By: Aline Brochure MD, Dorothyann Peng    . GERD (gastroesophageal reflux disease)   . History of blood transfusion 1980's   2nd surgical procedures  . Hypercholesteremia   . Hypokalemia 03/05/2012  . Hypothyroidism   . Multiple and bilateral precerebral artery syndromes 05/13/2016  . Myocardial infarction ([place]) 02/2012  . NSTEMI (non-ST elevated myocardial infarction) ([place]) 04/02/2015  . OSA (obstructive sleep apnea) 05/13/2016  . Ovarian tumor   . PAD (peripheral artery disease) ([place])    a. s/p LE angio 2015; followed by Dr. Fletcher Anon - managed medically.  . Pain with urination 05/08/2015  . Paroxysmal atrial fibrillation ([place]) 12/08/2017  . Pericardial effusion    a. 06/2016 after ppm - s/p pericardiocentesis.  [name] Posterior vitreous detachment of right eye 10/11/2019  . Retinal microaneurysm of right eye 10/11/2019  .  Right retinal defect 10/11/2019  . RLQ abdominal pain 11/24/2010  . S/P pericardiocentesis 06/28/2016  . 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 Stable central retinal vein occlusion of left eye 05/13/2020  . Superficial fungus infection of skin 06/29/2013  . Systolic congestive heart failure ([place]) 05/13/2016  . Tachy-brady syndrome ([place])    a. s/p Medtronic PPM 06/2016, c/b lead perf/pericardial effusion.  . Tamponade   . TIA (transient ischemic attack)   . Type 2 diabetes with nephropathy ([place]) 02/29/2012  . Type II diabetes mellitus ([place])   . Typical atrial flutter ([employer])   . UTI (urinary tract infection) 05/08/2013   Past Surgical History:  Procedure Laterality Date  . ABDOMINAL AORTAGRAM N/A 01/03/2014   Procedure: ABDOMINAL [name];  Surgeon: Wellington Hampshire, MD;  Location: [facility] CATH LAB;  Service: Cardiovascular;  Laterality: N/A;  . ABDOMINAL HYSTERECTOMY  1972   "partial"  . APPENDECTOMY  1970's  . CARDIAC CATHETERIZATION  2008   Tiny OM-2 with 90% narrowing. Med tx.  [name] CARDIAC CATHETERIZATION N/A 11/30/2014    Procedure: Left Heart Cath and Coronary Angiography;  Surgeon: Troy Sine, MD; LAD 20%, CFX 50%, OM1 95%, right PLB 30%, LV normal   . CARDIAC CATHETERIZATION N/A 11/30/2014   Procedure: Coronary Balloon Angioplasty;  Surgeon: Troy Sine, MD;  Angiosculpt scoring balloon and PTCA to the OM1 reducing stenosis from 95% to less than 10%  . CARDIAC CATHETERIZATION N/A 04/03/2015   Procedure: Left Heart Cath and Coronary Angiography;  Surgeon: Jolaine Artist, MD; dLAD 50%, CFX 90%, OM1 100%, PLA 15%, LVEDP 13    . CARDIAC CATHETERIZATION N/A 04/03/2015   Procedure: Coronary Stent Intervention;  Surgeon: Sherren Mocha, MD; 3.0x18 mm Xience DES to the CFX    . CARDIAC CATHETERIZATION N/A 08/02/2015   Procedure: Left Heart Cath and Coronary Angiography;  Surgeon: Troy Sine, MD;  Location: [facility] CV LAB;  Service: Cardiovascular;  Laterality: N/A;  . CARDIAC CATHETERIZATION N/A 08/02/2015   Procedure: Coronary Stent Intervention;  Surgeon: Troy Sine, MD;  Location: [facility] CV LAB;  Service: Cardiovascular;  Laterality: N/A;  . CARDIAC CATHETERIZATION N/A 06/25/2016   Procedure: Pericardiocentesis;  Surgeon: Will Meredith Leeds, MD;  Location: [facility] LAB;  Service: Cardiovascular;  Laterality: N/A;  . cardiac stents    . CARDIOVERSION N/A 12/15/2017   Procedure: CARDIOVERSION;  Surgeon: Acie Fredrickson Wonda Cheng, MD;  Location: [facility] ENDOSCOPY;  Service: Cardiovascular;  Laterality: N/A;  . CHOLECYSTECTOMY OPEN  1990's  . COLONOSCOPY  2005   Dr. Laural Golden: pancolonic divericula, polyp, path unknown currently  . COLONOSCOPY  2012   Dr. Oneida Alar: Normal TI, scattered diverticula in entire colon, small internal hemorrhoids, normal colon biopsies. Colonoscopy in 5-10 years.   . COLOSTOMY  05/1979  . COLOSTOMY REVERSAL  11/1979  . EP IMPLANTABLE DEVICE N/A 06/25/2016   Procedure: Lead Revision/Repair;  Surgeon: Will Meredith Leeds, MD;  Location: [facility] CV LAB;  Service: Cardiovascular;   Laterality: N/A;  . EP IMPLANTABLE DEVICE N/A 06/25/2016   Procedure: Pacemaker Implant;  Surgeon: Will Meredith Leeds, MD;  Location: [facility];  Service: Cardiovascular;  Laterality: N/A;  . EXCISIONAL HEMORRHOIDECTOMY  1970's  . EYE SURGERY Left 2000   "branch vein occlusion"  . EYE SURGERY Left ~ 2001   "smoothed out wrinkle"  . LEFT OOPHORECTOMY  05/1979   nicked bowel, peritonitis, colostomy; colostomy reversed 1981   . LOWER EXTREMITY ANGIOGRAM N/A 01/03/2014  Procedure: LOWER EXTREMITY ANGIOGRAM;  Surgeon: Wellington Hampshire, MD;  Location: [facility] CATH LAB;  Service: Cardiovascular;  Laterality: N/A;  . Nuclear med stress test  10/2011   Small area of mild ischemia inferoapically.  [name] PARTIAL HYSTERECTOMY  1970's   left ovaries, then ovaries removed later due tumors   . RIGHT OOPHORECTOMY  1970's    FAMILY HISTORY Family History  Problem Relation Age of Onset  . Heart disease Mother        deceased  . Heart disease Father        deceased, heart disease  . Diabetes Brother   . Heart disease Brother   . Thyroid disease Brother   . Heart disease Sister   . Heart disease Brother   . Thyroid disease Brother   . Lupus Daughter   . Colon cancer Neg Hx     SOCIAL HISTORY Social History   Tobacco Use  . Smoking status: Never Smoker  . Smokeless tobacco: Never Used  . Tobacco comment: Never smoked  Vaping Use  . Vaping Use: Never used  Substance Use Topics  . Alcohol use: No    Alcohol/week: 0.0 standard drinks  . Drug use: No         OPHTHALMIC EXAM: Base Eye Exam    Visual Acuity (ETDRS)      Right Left   Dist [place] 20/50 CF at 3'   Dist ph  NI NI   Correction: Glasses       Tonometry (Tonopen, 9:51 AM)      Right Left   Pressure 09 10       Pupils      Pupils Dark Light Shape React APD   Right PERRL 5 4 Round Brisk None   Left PERRL 5 4 Round Brisk +1       Visual Fields (Counting fingers)      Left Right     Full   Restrictions Total  superior temporal, inferior temporal, inferior nasal deficiencies        Extraocular Movement      Right Left    Full Full       Neuro/Psych    Oriented x3: Yes   Mood/Affect: Normal       Dilation    Right eye: 1.0% Mydriacyl, 2.5% Phenylephrine @ 9:51 AM        Slit Lamp and Fundus Exam    External Exam      Right Left   External Normal Normal       Slit Lamp Exam      Right Left   Lids/Lashes Normal Normal   Conjunctiva/Sclera White and quiet White and quiet   Cornea Clear Clear   Anterior Chamber Deep and quiet Deep and quiet   Iris Round and reactive Round and reactive   Lens Posterior chamber intraocular lens Posterior chamber intraocular lens   Anterior Vitreous Normal Normal       Fundus Exam      Right Left   Posterior Vitreous Posterior vitreous detachment, Central vitreous floaters    Disc Normal    C/D Ratio 0.2    Macula Hard drusen,    No Cystoid macular edema, Microaneurysms, Retinal pigment epithelial mottling, no hemorrhage, no exudates    Vessels Old branch retinal vein occlusion    Periphery Normal,  , no retinal hole, no retinal tear           IMAGING AND PROCEDURES  Imaging and Procedures for 10/28/20  OCT, Retina - OU - Both Eyes       Right Eye Quality was good. Scan locations included subfoveal. Central Foveal Thickness: 227. Progression has been stable. Findings include cystoid macular edema.   Left Eye Quality was poor. Scan locations included subfoveal. Central Foveal Thickness: 215. Progression has been stable. Findings include subretinal scarring, disciform scar.   Notes Minor perifoveal CME from BRVO persists, multiple recurrences in this monocular patient, currently at 5.5-week interval.  Repeat injection Avastin OD today and follow-up again in 6 weeks  OS, no active maculopathy, cicatricial scarring Accounts for acuity       Intravitreal Injection, Pharmacologic Agent - OD - Right Eye       Time Out 10/28/2020.  11:09 AM. Confirmed correct patient, procedure, site, and patient consented.   Anesthesia Topical anesthesia was used. Anesthetic medications included [place] 3.5%.   Procedure Preparation included 10% betadine to eyelids, 5% betadine to ocular surface, Tobramycin 0.3%. A 30 gauge needle was used.   Injection:  2.5 mg Bevacizumab (AVASTIN) 2.5mg /0.40mL SOSY   NDC: 16109-604-54, Lot: [id number]   Route: Intravitreal, Site: Right Eye  Post-op Post injection exam found visual acuity of at least counting fingers. The patient tolerated the procedure well. There were no complications. The patient received written and verbal post procedure care education.                 ASSESSMENT/PLAN:  Branch retinal vein occlusion with macular edema of right eye Much less perifoveal CME at 5.5 weeks post recent evaluation and injection intravitreal Avastin, repeat today and examination again in 5 to 6 weeks  Posterior vitreous detachment of right eye No pathology, central vitreous floaters remain      ICD-10-CM   1. Branch retinal vein occlusion with macular edema of right eye  H34.8310 OCT, Retina - OU - Both Eyes    Intravitreal Injection, Pharmacologic Agent - OD - Right Eye    bevacizumab (AVASTIN) SOSY 2.5 mg  2. Posterior vitreous detachment of right eye  H43.811     1.  Overall stable, less CME perifoveal from BRVO at 5.5 weeks postinjection Avastin repeat injection today and examination again in 5 to 6 weeks OD 2.  3.  Ophthalmic Meds Ordered this visit:  Meds ordered this encounter  Medications  . bevacizumab (AVASTIN) SOSY 2.5 mg       Return in about 5 weeks (around 12/02/2020) for AVASTIN OCT, OD.  There are no Patient Instructions on file for this visit.   Explained the diagnoses, plan, and follow up with the patient and they expressed understanding.  Patient expressed understanding of the importance of proper follow up care.   Clent Demark Teondre Jarosz M.D. Diseases & Surgery of  the Retina and Vitreous Retina & Diabetic [place] 10/28/20     Abbreviations: M myopia (nearsighted); A astigmatism; H hyperopia (farsighted); P presbyopia; Mrx spectacle prescription;  CTL contact lenses; OD right eye; OS left eye; OU both eyes  XT exotropia; ET esotropia; PEK punctate epithelial keratitis; PEE punctate epithelial erosions; DES dry eye syndrome; MGD meibomian gland dysfunction; ATs artificial tears; PFAT's preservative free artificial tears; [place] nuclear sclerotic cataract; PSC posterior subcapsular cataract; ERM epi-retinal membrane; PVD posterior vitreous detachment; RD retinal detachment; DM diabetes mellitus; DR diabetic retinopathy; NPDR non-proliferative diabetic retinopathy; PDR proliferative diabetic retinopathy; CSME clinically significant macular edema; DME diabetic macular edema; dbh dot blot hemorrhages; CWS cotton wool spot; POAG primary open angle glaucoma; C/D cup-to-disc ratio; HVF humphrey visual field;  GVF goldmann visual field; OCT optical coherence tomography; IOP intraocular pressure; BRVO Branch retinal vein occlusion; CRVO central retinal vein occlusion; CRAO central retinal artery occlusion; BRAO branch retinal artery occlusion; RT retinal tear; SB scleral buckle; PPV pars plana vitrectomy; VH Vitreous hemorrhage; PRP panretinal laser photocoagulation; IVK intravitreal kenalog; VMT vitreomacular traction; MH Macular hole;  NVD neovascularization of the disc; NVE neovascularization elsewhere; AREDS age related eye disease study; ARMD age related macular degeneration; POAG primary open angle glaucoma; EBMD epithelial/anterior basement membrane dystrophy; ACIOL anterior chamber intraocular lens; IOL intraocular lens; PCIOL posterior chamber intraocular lens; Phaco/IOL phacoemulsification with intraocular lens placement; [place] photorefractive keratectomy; LASIK laser assisted in situ keratomileusis; HTN hypertension; DM diabetes mellitus; COPD chronic obstructive  pulmonary disease

## 2020-10-28 NOTE — Assessment & Plan Note (Signed)
No pathology, central vitreous floaters remain

## 2020-10-30 DIAGNOSIS — N189 Chronic kidney disease, unspecified: Secondary | ICD-10-CM | POA: Diagnosis not present

## 2020-10-30 DIAGNOSIS — E211 Secondary hyperparathyroidism, not elsewhere classified: Secondary | ICD-10-CM | POA: Diagnosis not present

## 2020-10-30 DIAGNOSIS — E1129 Type 2 diabetes mellitus with other diabetic kidney complication: Secondary | ICD-10-CM | POA: Diagnosis not present

## 2020-10-30 DIAGNOSIS — R809 Proteinuria, unspecified: Secondary | ICD-10-CM | POA: Diagnosis not present

## 2020-10-30 DIAGNOSIS — E1122 Type 2 diabetes mellitus with diabetic chronic kidney disease: Secondary | ICD-10-CM | POA: Diagnosis not present

## 2020-10-30 DIAGNOSIS — E559 Vitamin D deficiency, unspecified: Secondary | ICD-10-CM | POA: Diagnosis not present

## 2020-10-30 DIAGNOSIS — E872 Acidosis: Secondary | ICD-10-CM | POA: Diagnosis not present

## 2020-10-30 DIAGNOSIS — I129 Hypertensive chronic kidney disease with stage 1 through stage 4 chronic kidney disease, or unspecified chronic kidney disease: Secondary | ICD-10-CM | POA: Diagnosis not present

## 2020-10-30 DIAGNOSIS — I5032 Chronic diastolic (congestive) heart failure: Secondary | ICD-10-CM | POA: Diagnosis not present

## 2020-11-19 ENCOUNTER — Other Ambulatory Visit: Payer: Self-pay | Admitting: Neurology

## 2020-11-19 ENCOUNTER — Telehealth: Payer: Self-pay | Admitting: Neurology

## 2020-11-19 DIAGNOSIS — E1122 Type 2 diabetes mellitus with diabetic chronic kidney disease: Secondary | ICD-10-CM | POA: Diagnosis not present

## 2020-11-19 DIAGNOSIS — Z9989 Dependence on other enabling machines and devices: Secondary | ICD-10-CM

## 2020-11-19 DIAGNOSIS — I480 Paroxysmal atrial fibrillation: Secondary | ICD-10-CM | POA: Diagnosis not present

## 2020-11-19 DIAGNOSIS — G4733 Obstructive sleep apnea (adult) (pediatric): Secondary | ICD-10-CM

## 2020-11-19 DIAGNOSIS — I130 Hypertensive heart and chronic kidney disease with heart failure and stage 1 through stage 4 chronic kidney disease, or unspecified chronic kidney disease: Secondary | ICD-10-CM | POA: Diagnosis not present

## 2020-11-19 DIAGNOSIS — N184 Chronic kidney disease, stage 4 (severe): Secondary | ICD-10-CM | POA: Diagnosis not present

## 2020-11-19 NOTE — Telephone Encounter (Signed)
Called the patient and advised that I have sent an updated order for the patient to get mask and supplies. Encouraged the pt to keep upcoming apt in august so we may be able to update that script yearly for her. Pt verbalized understanding.

## 2020-11-19 NOTE — Telephone Encounter (Signed)
Pt called, need a new mask, pharmacy said they need a authorization for a new mask. Need the authorization so the insurance will pay for it. Would like a call from the nurse.

## 2020-11-22 DIAGNOSIS — G4733 Obstructive sleep apnea (adult) (pediatric): Secondary | ICD-10-CM | POA: Diagnosis not present

## 2020-12-02 ENCOUNTER — Encounter (INDEPENDENT_AMBULATORY_CARE_PROVIDER_SITE_OTHER): Payer: HMO | Admitting: Ophthalmology

## 2020-12-05 ENCOUNTER — Ambulatory Visit (INDEPENDENT_AMBULATORY_CARE_PROVIDER_SITE_OTHER): Payer: HMO | Admitting: Sports Medicine

## 2020-12-05 ENCOUNTER — Other Ambulatory Visit: Payer: Self-pay

## 2020-12-05 ENCOUNTER — Encounter: Payer: Self-pay | Admitting: Sports Medicine

## 2020-12-05 DIAGNOSIS — E119 Type 2 diabetes mellitus without complications: Secondary | ICD-10-CM

## 2020-12-05 DIAGNOSIS — M79675 Pain in left toe(s): Secondary | ICD-10-CM

## 2020-12-05 DIAGNOSIS — B351 Tinea unguium: Secondary | ICD-10-CM

## 2020-12-05 DIAGNOSIS — M79674 Pain in right toe(s): Secondary | ICD-10-CM | POA: Diagnosis not present

## 2020-12-05 DIAGNOSIS — Z7901 Long term (current) use of anticoagulants: Secondary | ICD-10-CM

## 2020-12-05 MED ORDER — NEOMYCIN-POLYMYXIN-HC 1 % OT SOLN: OTIC | 0 refills | Status: DC

## 2020-12-05 NOTE — Progress Notes (Signed)
Subjective: Virginia Huber is a 75 y.o. female patient with history of diabetes who presents to office today complaining of long,mildly painful nails while ambulating in shoes; unable to trim. Admits bleeding at left>right 1st toenail and soreness at the medial corner like last time still sore. Patient states that the glucose reading this morning was not recorded.   Patient Active Problem List   Diagnosis Date Noted   Advanced nonexudative age-related macular degeneration of left eye with subfoveal involvement 06/26/2020   Stable central retinal vein occlusion of left eye 05/13/2020   Early stage nonexudative age-related macular degeneration of right eye 02/27/2020   OSA on CPAP 02/12/2020   Branch retinal vein occlusion with macular edema of right eye 10/11/2019   Severe nonproliferative diabetic retinopathy of right eye, with macular edema, associated with type 2 diabetes mellitus ([place]) 10/11/2019   Right retinal defect 10/11/2019   Posterior vitreous detachment of right eye 10/11/2019   Retinal microaneurysm of right eye 10/11/2019   CHF exacerbation ([place]) 08/25/2019   Acute exacerbation of CHF (congestive heart failure) ([place]) 08/24/2019   Acute on chronic diastolic HF (heart failure) ([place]) 12/17/2017   GERD (gastroesophageal reflux disease) 12/17/2017   Typical atrial flutter (HCC)    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   Paroxysmal atrial fibrillation ([place]) 12/08/2017   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/08/2017   Chronic renal failure, stage 3b ([name]) 09/23/2017   Fever 09/23/2017   S/P pericardiocentesis 06/28/2016   Acute blood loss anemia 06/28/2016   Pericardial effusion 06/26/2016   Tachy-brady syndrome ([place]) 06/25/2016   Tamponade    Bradycardia 06/14/2016   Junctional bradycardia    Coronary artery disease involving coronary bypass graft of native heart with unstable angina pectoris (HCC)    Acute diastolic CHF (congestive heart  failure), NYHA class 3 (HCC)    Systolic congestive heart failure ([place]) 05/13/2016   Multiple and bilateral precerebral artery syndromes 05/13/2016   OSA (obstructive sleep apnea) 05/13/2016   Chronic diarrhea 12/25/2015   Chest pain 08/02/2015   Atrial fibrillation with rapid ventricular response (HCC)    Pain with urination 05/08/2015   NSTEMI (non-ST elevated myocardial infarction) ([place]) 04/02/2015   CAD in native artery 11/30/2014   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    PAD (peripheral artery disease) ([place]) 12/26/2013   Superficial fungus infection of skin 06/29/2013   UTI (urinary tract infection) 05/08/2013   Hypokalemia 03/05/2012   B12 deficiency anemia 03/02/2012   Bronchospasm 03/02/2012   Community acquired bacterial pneumonia 03/01/2012   Acute respiratory failure with hypoxia ([place]) 03/01/2012   Type 2 diabetes with nephropathy ([place]) 02/29/2012   Hypothyroidism 02/28/2012   RLQ abdominal pain 11/24/2010   OVERWEIGHT/OBESITY 06/03/2010   Essential hypertension 06/03/2010   Overweight 06/03/2010   Mixed hyperlipidemia 12/27/2009   Palpitations 05/17/2009   FRACTURE, TOE 12/06/2007   Current Outpatient Medications on File Prior to Visit  Medication Sig Dispense Refill   acetaminophen (TYLENOL) 325 MG tablet Take 650 mg by mouth every 6 (six) hours as needed for headache.     amiodarone (PACERONE) 200 MG tablet Take 0.5 tablets (100 mg total) by mouth daily. 45 tablet 1   apixaban (ELIQUIS) 5 MG TABS tablet Take 1 tablet (5 mg total) by mouth 2 (two) times daily. 28 tablet 0   atorvastatin (LIPITOR) 80 MG tablet TAKE ONE TABLET BY MOUTH EVERY DAY AT 6:00PM 90 tablet 3   carvedilol (COREG)  12.5 MG tablet Take 1 tablet (12.5 mg total) by mouth 2 (two) times daily. (Patient taking differently: Take 12.5 mg by mouth 2 (two) times daily. 1/2 tablet 2 (two) times daily) 180 tablet 3   Cholecalciferol (VITAMIN D3 PO) Take  1 tablet by mouth daily.      ciprofloxacin (CIPRO) 500 MG tablet Take ONE tablet by MOUTH in THE morning AND ONE tablet in THE evening. DO ALL this FOR 10 DAYS.     clopidogrel (PLAVIX) 75 MG tablet TAKE ONE TABLET (75MG  TOTAL) BY MOUTH DAILY 90 tablet 3   Continuous Blood Gluc Receiver (FREESTYLE LIBRE 2 READER) DEVI 1 each by Does not apply route daily. 1 each 0   Continuous Blood Gluc Sensor (FREESTYLE LIBRE 2 SENSOR) MISC 1 each by Does not apply route every 14 (fourteen) days. 6 each 3   cyanocobalamin (,VITAMIN B-12,) 1000 MCG/ML injection Inject 1,000 mcg into the muscle every 30 (thirty) days. Last injection was around 08/02/19     diltiazem (CARDIZEM CD) 360 MG 24 hr capsule Take 1 capsule (360 mg total) by mouth daily. 90 capsule 3   diltiazem (TIAZAC) 360 MG 24 hr capsule TAKE ONE CAPSULE BY MOUTH EVERY MORNING WITH BREAKFAST     Dulaglutide (TRULICITY) 3 PQ/3.3AQ SOPN Inject 3 mg into the skin once a week. 6 mL 3   FREESTYLE LITE test strip 1 each by Other route 2 (two) times daily.      furosemide (LASIX) 40 MG tablet TAKE ONE AND HALF TABLET (60MG  TOTAL) BY MOUTH IN THE MORNING AND ONE TABLET IN THE EVENING (40MG  TOTAL) 180 tablet 4   hydrALAZINE (APRESOLINE) 50 MG tablet Take 1 tablet (50 mg total) by mouth 3 (three) times daily. (Patient taking differently: Take 50 mg by mouth 2 (two) times daily.) 270 tablet 3   insulin NPH-regular Human (70-30) 100 UNIT/ML injection Inject 20-30 units 2x a day before meals 45 mL 3   levothyroxine (SYNTHROID) 125 MCG tablet Take 1 tablet (125 mcg total) by mouth daily. 90 tablet 3   nitroGLYCERIN (NITROSTAT) 0.4 MG SL tablet Place 1 tablet (0.4 mg total) under the tongue every 5 (five) minutes as needed for chest pain. 25 tablet 0   olmesartan (BENICAR) 40 MG tablet Take 40 mg by mouth daily.     potassium chloride SA (KLOR-CON) 20 MEQ tablet TAKE ONE (1) TABLET BY MOUTH EVERY DAY 90 tablet 1   rOPINIRole (REQUIP) 0.5 MG tablet Take 0.5 mg by mouth  at bedtime.   0   No current facility-administered medications on file prior to visit.   Allergies  Allergen Reactions   Penicillins Hives    Has patient had a PCN reaction causing immediate rash, facial/tongue/throat swelling, SOB or lightheadedness with hypotension: Yes Has patient had a PCN reaction causing severe rash involving mucus membranes or skin necrosis: No Has patient had a PCN reaction that required hospitalization No Has patient had a PCN reaction occurring within the last 10 years: No If all of the above answers are "NO", then may proceed with Cephalosporin use.   Percocet [Oxycodone-Acetaminophen] Nausea And Vomiting     Objective: General: Patient is awake, alert, and oriented x 3 and in no acute distress.  Integument: Skin is warm, dry and supple bilateral. Nails are tender, long, thickened and dystrophic with subungual debris, consistent with onychomycosis, 1-5 bilateral. Mild curvature of bilateral hallux nails. No acute signs of infection. No open lesions or preulcerative lesions present bilateral. Remaining integument  unremarkable.  Vasculature:  Dorsalis Pedis pulse 1/4 bilateral. Posterior Tibial pulse  0/4 bilateral.  Capillary fill time <3 sec 1-5 bilateral. Positive hair growth to the level of the digits. Temperature gradient within normal limits. No varicosities present bilateral. Trace edema present bilateral.   Neurology: Gross sensation present via light touch bilateral.  Protective sensation intact with Semmes Weinstein monofilament bilateral.  Musculoskeletal: Asymptomatic bunion and hammertoe with cross over R>L pedal deformities noted bilateral.  Strength within normal limits.  No other acute findings or tenderness to palpation bilateral.  Assessment and Plan: Problem List Items Addressed This Visit   None Visit Diagnoses     Pain due to onychomycosis of toenails of both feet    -  Primary   Diabetes mellitus without complication ([place])        Anticoagulated          -Examined patient. -Mechanically debrided all nails 1-5 bilateral using sterile nail nipper and filed with dremel without incident  -Advised epsom salt and corticosprion as Rx to bilateral 1st toes if continues to ingrow may need nail procedure -Advised good supportive shoes daily for foot type -Answered all patient questions -Patient to return  in 3 months for at risk foot care -Patient advised to call the office if any problems or questions arise in the meantime.  Landis Martins, DPM

## 2020-12-09 ENCOUNTER — Encounter (INDEPENDENT_AMBULATORY_CARE_PROVIDER_SITE_OTHER): Payer: HMO | Admitting: Ophthalmology

## 2020-12-17 ENCOUNTER — Other Ambulatory Visit: Payer: Self-pay

## 2020-12-17 ENCOUNTER — Ambulatory Visit (INDEPENDENT_AMBULATORY_CARE_PROVIDER_SITE_OTHER): Payer: HMO | Admitting: Ophthalmology

## 2020-12-17 ENCOUNTER — Encounter (INDEPENDENT_AMBULATORY_CARE_PROVIDER_SITE_OTHER): Payer: Self-pay | Admitting: Ophthalmology

## 2020-12-17 DIAGNOSIS — H353111 Nonexudative age-related macular degeneration, right eye, early dry stage: Secondary | ICD-10-CM | POA: Diagnosis not present

## 2020-12-17 DIAGNOSIS — H34831 Tributary (branch) retinal vein occlusion, right eye, with macular edema: Secondary | ICD-10-CM | POA: Diagnosis not present

## 2020-12-17 DIAGNOSIS — H348122 Central retinal vein occlusion, left eye, stable: Secondary | ICD-10-CM

## 2020-12-17 MED ORDER — BEVACIZUMAB 2.5 MG/0.1ML IZ SOSY
2.5000 mg | PREFILLED_SYRINGE | INTRAVITREAL | Status: AC | PRN
  Administered 2020-12-17: 2.5 mg via INTRAVITREAL

## 2020-12-17 NOTE — Assessment & Plan Note (Signed)
Extensive retinal atrophy, no complications observe OS

## 2020-12-17 NOTE — Assessment & Plan Note (Signed)
No signs of progression OD

## 2020-12-17 NOTE — Progress Notes (Signed)
12/17/2020     CHIEF COMPLAINT Patient presents for Retina Follow Up (7 Wk F/U OD, poss Avastin OD//Pt c/o decreased VA OD x 1 week. Pt sts VA OD is "blurry." No changes OS.)   HISTORY OF PRESENT ILLNESS: Virginia Huber is a 75 y.o. female who presents to the clinic today for:   HPI     Retina Follow Up           Diagnosis: CRVO/BRVO   Laterality: right eye   Onset: 7 weeks ago   Severity: mild   Duration: 7 weeks   Course: gradually worsening   Comments: 7 Wk F/U OD, poss Avastin OD  Pt c/o decreased VA OD x 1 week. Pt sts VA OD is "blurry." No changes OS.       Last edited by Milly Jakob, O'Brien on 12/17/2020 10:12 AM.      Referring physician: Sharilyn Sites, MD [address] [place],  [place] [address]  HISTORICAL INFORMATION:   Selected notes from the MEDICAL RECORD NUMBER    Lab Results  Component Value Date   HGBA1C 7.4 (A) 10/22/2020     CURRENT MEDICATIONS: No current outpatient medications on file. (Ophthalmic Drugs)   No current facility-administered medications for this visit. (Ophthalmic Drugs)   Current Outpatient Medications (Other)  Medication Sig   acetaminophen (TYLENOL) 325 MG tablet Take 650 mg by mouth every 6 (six) hours as needed for headache.   amiodarone (PACERONE) 200 MG tablet Take 0.5 tablets (100 mg total) by mouth daily.   apixaban (ELIQUIS) 5 MG TABS tablet Take 1 tablet (5 mg total) by mouth 2 (two) times daily.   atorvastatin (LIPITOR) 80 MG tablet TAKE ONE TABLET BY MOUTH EVERY DAY AT 6:00PM   carvedilol (COREG) 12.5 MG tablet Take 1 tablet (12.5 mg total) by mouth 2 (two) times daily. (Patient taking differently: Take 12.5 mg by mouth 2 (two) times daily. 1/2 tablet 2 (two) times daily)   Cholecalciferol (VITAMIN D3 PO) Take 1 tablet by mouth daily.    ciprofloxacin (CIPRO) 500 MG tablet Take ONE tablet by MOUTH in THE morning AND ONE tablet in THE evening. DO ALL this FOR 10 DAYS.   clopidogrel (PLAVIX) 75 MG tablet TAKE  ONE TABLET (75MG  TOTAL) BY MOUTH DAILY   Continuous Blood Gluc Receiver (FREESTYLE LIBRE 2 READER) DEVI 1 each by Does not apply route daily.   Continuous Blood Gluc Sensor (FREESTYLE LIBRE 2 SENSOR) MISC 1 each by Does not apply route every 14 (fourteen) days.   cyanocobalamin (,VITAMIN B-12,) 1000 MCG/ML injection Inject 1,000 mcg into the muscle every 30 (thirty) days. Last injection was around 08/02/19   diltiazem (CARDIZEM CD) 360 MG 24 hr capsule Take 1 capsule (360 mg total) by mouth daily.   diltiazem (TIAZAC) 360 MG 24 hr capsule TAKE ONE CAPSULE BY MOUTH EVERY MORNING WITH BREAKFAST   Dulaglutide (TRULICITY) 3 VC/9.4WH SOPN Inject 3 mg into the skin once a week.   FREESTYLE LITE test strip 1 each by Other route 2 (two) times daily.    furosemide (LASIX) 40 MG tablet TAKE ONE AND HALF TABLET (60MG  TOTAL) BY MOUTH IN THE MORNING AND ONE TABLET IN THE EVENING (40MG  TOTAL)   hydrALAZINE (APRESOLINE) 50 MG tablet Take 1 tablet (50 mg total) by mouth 3 (three) times daily. (Patient taking differently: Take 50 mg by mouth 2 (two) times daily.)   insulin NPH-regular Human (70-30) 100 UNIT/ML injection Inject 20-30 units 2x a day before  meals   levothyroxine (SYNTHROID) 125 MCG tablet Take 1 tablet (125 mcg total) by mouth daily.   NEOMYCIN-POLYMYXIN-HYDROCORTISONE (CORTISPORIN) 1 % SOLN OTIC solution Apply 1-2 drops to nail once daily after shower   nitroGLYCERIN (NITROSTAT) 0.4 MG SL tablet Place 1 tablet (0.4 mg total) under the tongue every 5 (five) minutes as needed for chest pain.   olmesartan (BENICAR) 40 MG tablet Take 40 mg by mouth daily.   potassium chloride SA (KLOR-CON) 20 MEQ tablet TAKE ONE (1) TABLET BY MOUTH EVERY DAY   rOPINIRole (REQUIP) 0.5 MG tablet Take 0.5 mg by mouth at bedtime.    No current facility-administered medications for this visit. (Other)      REVIEW OF SYSTEMS:    ALLERGIES Allergies  Allergen Reactions   Penicillins Hives    Has patient had a PCN  reaction causing immediate rash, facial/tongue/throat swelling, SOB or lightheadedness with hypotension: Yes Has patient had a PCN reaction causing severe rash involving mucus membranes or skin necrosis: No Has patient had a PCN reaction that required hospitalization No Has patient had a PCN reaction occurring within the last 10 years: No If all of the above answers are "NO", then may proceed with Cephalosporin use.   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 h/o PAF/flutter during admission in 2013 for PNA. b. PAF during adm for NSTEMI 07/2015, subsequent paroxysms since then.   Atrial fibrillation with rapid ventricular response (HCC)    B12 deficiency anemia    Bradycardia 06/14/2016   Branch retinal vein occlusion with macular edema of right eye 10/11/2019   Bronchospasm 03/02/2012   CAD in native artery 11/30/2014   Chronic renal failure, stage 3b ([place]) 09/23/2017   Coronary artery disease 11/30/2014   a. remote MI. b. h/o PTCA with scoring balloon to OM1 11/2014. c. NSTEMI 03/2015 s/p DES to prox-mid Cx. d. 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 s/p LE angio 2015; followed by Dr. Fletcher Anon - managed medically.   Pain with urination 05/08/2015   Paroxysmal atrial fibrillation (HCC)  12/08/2017   Pericardial effusion    a. 06/2016 after ppm - s/p pericardiocentesis.   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 s/p Medtronic PPM 06/2016, c/b lead perf/pericardial effusion.   Tamponade    TIA (transient ischemic attack)    Type 2 diabetes with nephropathy ([facility]) 02/29/2012   Type II diabetes mellitus (HCC)    Typical atrial flutter (HCC)    UTI (urinary tract infection) 05/08/2013   Past Surgical History:  Procedure Laterality Date   ABDOMINAL AORTAGRAM N/A 01/03/2014   Procedure: ABDOMINAL [name];  Surgeon: Wellington Hampshire, MD;  Location: [facility] CATH LAB;  Service: Cardiovascular;  Laterality: N/A;   ABDOMINAL HYSTERECTOMY  1972   "partial"   APPENDECTOMY  1970's   CARDIAC CATHETERIZATION  2008   Tiny  OM-2 with 90% narrowing. Med tx.   CARDIAC CATHETERIZATION N/A 11/30/2014   Procedure: Left Heart Cath and Coronary Angiography;  Surgeon: Troy Sine, MD; LAD 20%, CFX 50%, OM1 95%, right PLB 30%, LV normal    CARDIAC CATHETERIZATION N/A 11/30/2014   Procedure: Coronary Balloon Angioplasty;  Surgeon: Troy Sine, MD;  Angiosculpt scoring balloon and PTCA to the OM1 reducing stenosis from 95% to less than 10%   CARDIAC CATHETERIZATION N/A 04/03/2015   Procedure: Left Heart Cath and Coronary Angiography;  Surgeon: Jolaine Artist, MD; dLAD 50%, CFX 90%, OM1 100%, PLA 15%, LVEDP 13     CARDIAC CATHETERIZATION N/A 04/03/2015   Procedure: Coronary Stent Intervention;  Surgeon: Sherren Mocha, MD; 3.0x18 mm Xience DES to the CFX     CARDIAC CATHETERIZATION N/A 08/02/2015   Procedure: Left Heart Cath and Coronary Angiography;  Surgeon: Troy Sine, MD;  Location: [facility] CV LAB;  Service: Cardiovascular;  Laterality: N/A;   CARDIAC CATHETERIZATION N/A 08/02/2015   Procedure: Coronary Stent Intervention;  Surgeon: Troy Sine, MD;  Location: [facility] LAB;  Service: Cardiovascular;  Laterality: N/A;   CARDIAC CATHETERIZATION N/A 06/25/2016   Procedure: Pericardiocentesis;  Surgeon: Will Meredith Leeds, MD;  Location: [facility] CV LAB;  Service: Cardiovascular;  Laterality: N/A;   cardiac stents     CARDIOVERSION N/A 12/15/2017   Procedure: CARDIOVERSION;  Surgeon: Acie Fredrickson Wonda Cheng, MD;  Location: [facility] ENDOSCOPY;  Service: Cardiovascular;  Laterality: N/A;   CHOLECYSTECTOMY OPEN  1990's   COLONOSCOPY  2005   Dr. Laural Golden: pancolonic divericula, polyp, path unknown currently   COLONOSCOPY  2012   Dr. Oneida Alar: Normal TI, scattered diverticula in entire colon, small internal hemorrhoids, normal colon biopsies. Colonoscopy in 5-10 years.    COLOSTOMY  05/1979   COLOSTOMY REVERSAL  11/1979   EP IMPLANTABLE DEVICE N/A 06/25/2016   Procedure: Lead Revision/Repair;  Surgeon: Will Meredith Leeds,  MD;  Location: [facility] CV LAB;  Service: Cardiovascular;  Laterality: N/A;   EP IMPLANTABLE DEVICE N/A 06/25/2016   Procedure: Pacemaker Implant;  Surgeon: Will Meredith Leeds, MD;  Location: [facility] CV LAB;  Service: Cardiovascular;  Laterality: N/A;   EXCISIONAL HEMORRHOIDECTOMY  1970's   EYE SURGERY Left 2000   "branch vein occlusion"  EYE SURGERY Left ~ 2001   "smoothed out wrinkle"   LEFT OOPHORECTOMY  05/1979   nicked bowel, peritonitis, colostomy; colostomy reversed 1981    LOWER EXTREMITY ANGIOGRAM N/A 01/03/2014   Procedure: LOWER EXTREMITY ANGIOGRAM;  Surgeon: Wellington Hampshire, MD;  Location: [facility] CATH LAB;  Service: Cardiovascular;  Laterality: N/A;   Nuclear med stress test  10/2011   Small area of mild ischemia inferoapically.   PARTIAL HYSTERECTOMY  1970's   left ovaries, then ovaries removed later due tumors    RIGHT OOPHORECTOMY  1970's    FAMILY HISTORY Family History  Problem Relation Age of Onset   Heart disease Mother        deceased   Heart disease Father        deceased, heart disease   Diabetes Brother    Heart disease Brother    Thyroid disease Brother    Heart disease Sister    Heart disease Brother    Thyroid disease Brother    Lupus Daughter    Colon cancer Neg Hx     SOCIAL HISTORY Social History   Tobacco Use   Smoking status: Never   Smokeless tobacco: Never   Tobacco comments:    Never smoked  Vaping Use   Vaping Use: Never used  Substance Use Topics   Alcohol use: No    Alcohol/week: 0.0 standard drinks   Drug use: No         OPHTHALMIC EXAM:  Base Eye Exam     Visual Acuity (ETDRS)       Right Left   Dist cc 20/60 -1 CF at 3'   Dist ph cc NI NI    Correction: Glasses         Tonometry (Tonopen, 10:15 AM)       Right Left   Pressure 13 10         Pupils       Dark Light Shape React APD   Right 4 3 Round Brisk None   Left 4 3 Round Brisk Trace         Visual Fields (Counting fingers)       Left  Right     Full   Restrictions Total superior temporal, inferior temporal, inferior nasal deficiencies          Extraocular Movement       Right Left    Full Full         Neuro/Psych     Oriented x3: Yes   Mood/Affect: Normal         Dilation     Right eye: 1.0% Mydriacyl, 2.5% Phenylephrine @ 10:15 AM           Slit Lamp and Fundus Exam     External Exam       Right Left   External Normal Normal         Slit Lamp Exam       Right Left   Lids/Lashes Normal Normal   Conjunctiva/Sclera White and quiet White and quiet   Cornea Clear Clear   Anterior Chamber Deep and quiet Deep and quiet   Iris Round and reactive Round and reactive   Lens Posterior chamber intraocular lens Posterior chamber intraocular lens   Anterior Vitreous Normal Normal         Fundus Exam       Right Left   Posterior Vitreous Posterior vitreous detachment, Central vitreous floaters    Disc Normal  C/D Ratio 0.2    Macula Hard drusen,    No Cystoid macular edema clinically, Microaneurysms, Retinal pigment epithelial mottling, no hemorrhage, no exudates    Vessels Old branch retinal vein occlusion    Periphery Normal,  , no retinal hole, no retinal tear             IMAGING AND PROCEDURES  Imaging and Procedures for 12/17/20  OCT, Retina - OU - Both Eyes       Right Eye Quality was good. Scan locations included subfoveal. Central Foveal Thickness: 309. Progression has worsened. Findings include cystoid macular edema.   Left Eye Quality was poor. Scan locations included subfoveal. Central Foveal Thickness: 213. Progression has been stable. Findings include subretinal scarring, disciform scar.   Notes Minor perifoveal CME from BRVO persists, multiple recurrences in this monocular patient, currently at 7-week interval.  Repeat injection Avastin OD today and follow-up again in 5 weeks  OS, no active maculopathy, cicatricial scarring Accounts for acuity      Intravitreal Injection, Pharmacologic Agent - OD - Right Eye       Time Out 12/17/2020. 10:52 AM. Confirmed correct patient, procedure, site, and patient consented.   Anesthesia Topical anesthesia was used. Anesthetic medications included Akten 3.5%.   Procedure Preparation included 10% betadine to eyelids, 5% betadine to ocular surface, Tobramycin 0.3%. A 30 gauge needle was used.   Injection: 2.5 mg bevacizumab 2.5 MG/0.1ML   Route: Intravitreal, Site: Right Eye   NDC: [phone], Lot: [id number]   Post-op Post injection exam found visual acuity of at least counting fingers. The patient tolerated the procedure well. There were no complications. The patient received written and verbal post procedure care education. Post injection medications were not given.              ASSESSMENT/PLAN:  Branch retinal vein occlusion with macular edema of right eye Well-controlled BRVO with CME in the past at 5 weeks he had recurrences of CME occur at 6 and 7-week interval.  Will repeat injection intravitreal Avastin OD today and examination next in 5 weeks  Early stage nonexudative age-related macular degeneration of right eye No signs of progression OD     ICD-10-CM   1. Branch retinal vein occlusion with macular edema of right eye  H34.8310 OCT, Retina - OU - Both Eyes    Intravitreal Injection, Pharmacologic Agent - OD - Right Eye    bevacizumab (AVASTIN) SOSY 2.5 mg    2. Early stage nonexudative age-related macular degeneration of right eye  H35.3111       1.  OD with CME recurrent at 7-week interval post intravitreal Avastin, multiple recurrences in the past.  2.  We will repeat intravitreal Avastin OD today and examination next in 5 weeks  3.  OS, ischemic CRV O, atrophic, and active will observe  Ophthalmic Meds Ordered this visit:  Meds ordered this encounter  Medications   bevacizumab (AVASTIN) SOSY 2.5 mg       Return in about 5 weeks (around 01/21/2021) for  dilate, OD, AVASTIN OCT.  There are no Patient Instructions on file for this visit.   Explained the diagnoses, plan, and follow up with the patient and they expressed understanding.  Patient expressed understanding of the importance of proper follow up care.   Clent Demark Curtez Brallier M.D. Diseases & Surgery of the Retina and Vitreous [facility] [place] 12/17/20     Abbreviations: M myopia (nearsighted); A astigmatism; H hyperopia (farsighted); P presbyopia; Mrx  spectacle prescription;  CTL contact lenses; OD right eye; OS left eye; OU both eyes  XT exotropia; ET esotropia; PEK punctate epithelial keratitis; PEE punctate epithelial erosions; DES dry eye syndrome; MGD meibomian gland dysfunction; ATs artificial tears; PFAT's preservative free artificial tears; [place] nuclear sclerotic cataract; PSC posterior subcapsular cataract; ERM epi-retinal membrane; PVD posterior vitreous detachment; RD retinal detachment; DM diabetes mellitus; DR diabetic retinopathy; NPDR non-proliferative diabetic retinopathy; PDR proliferative diabetic retinopathy; CSME clinically significant macular edema; DME diabetic macular edema; dbh dot blot hemorrhages; CWS cotton wool spot; POAG primary open angle glaucoma; C/D cup-to-disc ratio; HVF humphrey visual field; GVF goldmann visual field; OCT optical coherence tomography; IOP intraocular pressure; BRVO Branch retinal vein occlusion; CRVO central retinal vein occlusion; CRAO central retinal artery occlusion; BRAO branch retinal artery occlusion; RT retinal tear; SB scleral buckle; PPV pars plana vitrectomy; VH Vitreous hemorrhage; PRP panretinal laser photocoagulation; IVK intravitreal kenalog; VMT vitreomacular traction; MH Macular hole;  NVD neovascularization of the disc; NVE neovascularization elsewhere; AREDS age related eye disease study; ARMD age related macular degeneration; POAG primary open angle glaucoma; EBMD epithelial/anterior basement membrane dystrophy; ACIOL  anterior chamber intraocular lens; IOL intraocular lens; PCIOL posterior chamber intraocular lens; Phaco/IOL phacoemulsification with intraocular lens placement; [place] photorefractive keratectomy; LASIK laser assisted in situ keratomileusis; HTN hypertension; DM diabetes mellitus; COPD chronic obstructive pulmonary disease

## 2020-12-17 NOTE — Assessment & Plan Note (Signed)
Well-controlled BRVO with CME in the past at 5 weeks he had recurrences of CME occur at 6 and 7-week interval.  Will repeat injection intravitreal Avastin OD today and examination next in 5 weeks

## 2020-12-26 DIAGNOSIS — E1129 Type 2 diabetes mellitus with other diabetic kidney complication: Secondary | ICD-10-CM | POA: Diagnosis not present

## 2020-12-26 DIAGNOSIS — I5032 Chronic diastolic (congestive) heart failure: Secondary | ICD-10-CM | POA: Diagnosis not present

## 2020-12-26 DIAGNOSIS — N189 Chronic kidney disease, unspecified: Secondary | ICD-10-CM | POA: Diagnosis not present

## 2020-12-26 DIAGNOSIS — R809 Proteinuria, unspecified: Secondary | ICD-10-CM | POA: Diagnosis not present

## 2020-12-26 DIAGNOSIS — E211 Secondary hyperparathyroidism, not elsewhere classified: Secondary | ICD-10-CM | POA: Diagnosis not present

## 2020-12-26 DIAGNOSIS — E559 Vitamin D deficiency, unspecified: Secondary | ICD-10-CM | POA: Diagnosis not present

## 2020-12-26 DIAGNOSIS — I129 Hypertensive chronic kidney disease with stage 1 through stage 4 chronic kidney disease, or unspecified chronic kidney disease: Secondary | ICD-10-CM | POA: Diagnosis not present

## 2020-12-26 DIAGNOSIS — E1122 Type 2 diabetes mellitus with diabetic chronic kidney disease: Secondary | ICD-10-CM | POA: Diagnosis not present

## 2020-12-26 DIAGNOSIS — R300 Dysuria: Secondary | ICD-10-CM | POA: Diagnosis not present

## 2020-12-26 DIAGNOSIS — E872 Acidosis: Secondary | ICD-10-CM | POA: Diagnosis not present

## 2021-01-02 DIAGNOSIS — N390 Urinary tract infection, site not specified: Secondary | ICD-10-CM | POA: Diagnosis not present

## 2021-01-02 DIAGNOSIS — E1129 Type 2 diabetes mellitus with other diabetic kidney complication: Secondary | ICD-10-CM | POA: Diagnosis not present

## 2021-01-02 DIAGNOSIS — I129 Hypertensive chronic kidney disease with stage 1 through stage 4 chronic kidney disease, or unspecified chronic kidney disease: Secondary | ICD-10-CM | POA: Diagnosis not present

## 2021-01-02 DIAGNOSIS — E211 Secondary hyperparathyroidism, not elsewhere classified: Secondary | ICD-10-CM | POA: Diagnosis not present

## 2021-01-02 DIAGNOSIS — R809 Proteinuria, unspecified: Secondary | ICD-10-CM | POA: Diagnosis not present

## 2021-01-02 DIAGNOSIS — N189 Chronic kidney disease, unspecified: Secondary | ICD-10-CM | POA: Diagnosis not present

## 2021-01-02 DIAGNOSIS — E559 Vitamin D deficiency, unspecified: Secondary | ICD-10-CM | POA: Diagnosis not present

## 2021-01-02 DIAGNOSIS — E1122 Type 2 diabetes mellitus with diabetic chronic kidney disease: Secondary | ICD-10-CM | POA: Diagnosis not present

## 2021-01-02 DIAGNOSIS — I5032 Chronic diastolic (congestive) heart failure: Secondary | ICD-10-CM | POA: Diagnosis not present

## 2021-01-06 DIAGNOSIS — E039 Hypothyroidism, unspecified: Secondary | ICD-10-CM | POA: Diagnosis not present

## 2021-01-06 DIAGNOSIS — I4891 Unspecified atrial fibrillation: Secondary | ICD-10-CM | POA: Diagnosis not present

## 2021-01-06 DIAGNOSIS — E119 Type 2 diabetes mellitus without complications: Secondary | ICD-10-CM | POA: Diagnosis not present

## 2021-01-06 DIAGNOSIS — E663 Overweight: Secondary | ICD-10-CM | POA: Diagnosis not present

## 2021-01-06 DIAGNOSIS — I2510 Atherosclerotic heart disease of native coronary artery without angina pectoris: Secondary | ICD-10-CM | POA: Diagnosis not present

## 2021-01-06 DIAGNOSIS — I5032 Chronic diastolic (congestive) heart failure: Secondary | ICD-10-CM | POA: Diagnosis not present

## 2021-01-06 DIAGNOSIS — Z6828 Body mass index (BMI) 28.0-28.9, adult: Secondary | ICD-10-CM | POA: Diagnosis not present

## 2021-01-06 DIAGNOSIS — N184 Chronic kidney disease, stage 4 (severe): Secondary | ICD-10-CM | POA: Diagnosis not present

## 2021-01-07 ENCOUNTER — Ambulatory Visit (INDEPENDENT_AMBULATORY_CARE_PROVIDER_SITE_OTHER): Payer: HMO

## 2021-01-07 DIAGNOSIS — I495 Sick sinus syndrome: Secondary | ICD-10-CM | POA: Diagnosis not present

## 2021-01-09 LAB — CUP PACEART REMOTE DEVICE CHECK
Battery Remaining Longevity: 75 mo
Battery Voltage: 3.01 V
Brady Statistic AP VP Percent: 0.01 %
Brady Statistic AP VS Percent: 29.44 %
Brady Statistic AS VP Percent: 0.08 %
Brady Statistic AS VS Percent: 70.48 %
Brady Statistic RA Percent Paced: 29.44 %
Brady Statistic RV Percent Paced: 0.09 %
Date Time Interrogation Session: 20220721144422
Implantable Lead Implant Date: 20180104
Implantable Lead Implant Date: 20180104
Implantable Lead Location: 753859
Implantable Lead Location: 753860
Implantable Lead Model: 5076
Implantable Lead Model: 5076
Implantable Pulse Generator Implant Date: 20180104
Lead Channel Impedance Value: 361 Ohm
Lead Channel Impedance Value: 380 Ohm
Lead Channel Impedance Value: 399 Ohm
Lead Channel Impedance Value: 494 Ohm
Lead Channel Pacing Threshold Amplitude: 0.75 V
Lead Channel Pacing Threshold Amplitude: 1.25 V
Lead Channel Pacing Threshold Pulse Width: 0.4 ms
Lead Channel Pacing Threshold Pulse Width: 0.4 ms
Lead Channel Sensing Intrinsic Amplitude: 1.375 mV
Lead Channel Sensing Intrinsic Amplitude: 1.375 mV
Lead Channel Sensing Intrinsic Amplitude: 12.5 mV
Lead Channel Sensing Intrinsic Amplitude: 12.5 mV
Lead Channel Setting Pacing Amplitude: 2 V
Lead Channel Setting Pacing Amplitude: 2.5 V
Lead Channel Setting Pacing Pulse Width: 0.4 ms
Lead Channel Setting Sensing Sensitivity: 2.8 mV

## 2021-01-13 ENCOUNTER — Encounter: Payer: Self-pay | Admitting: Internal Medicine

## 2021-01-19 DIAGNOSIS — I509 Heart failure, unspecified: Secondary | ICD-10-CM | POA: Diagnosis not present

## 2021-01-19 DIAGNOSIS — N184 Chronic kidney disease, stage 4 (severe): Secondary | ICD-10-CM | POA: Diagnosis not present

## 2021-01-19 DIAGNOSIS — I130 Hypertensive heart and chronic kidney disease with heart failure and stage 1 through stage 4 chronic kidney disease, or unspecified chronic kidney disease: Secondary | ICD-10-CM | POA: Diagnosis not present

## 2021-01-19 DIAGNOSIS — I480 Paroxysmal atrial fibrillation: Secondary | ICD-10-CM | POA: Diagnosis not present

## 2021-01-19 DIAGNOSIS — E1122 Type 2 diabetes mellitus with diabetic chronic kidney disease: Secondary | ICD-10-CM | POA: Diagnosis not present

## 2021-01-21 ENCOUNTER — Ambulatory Visit (INDEPENDENT_AMBULATORY_CARE_PROVIDER_SITE_OTHER): Payer: HMO | Admitting: Ophthalmology

## 2021-01-21 ENCOUNTER — Encounter (INDEPENDENT_AMBULATORY_CARE_PROVIDER_SITE_OTHER): Payer: Self-pay | Admitting: Ophthalmology

## 2021-01-21 ENCOUNTER — Other Ambulatory Visit: Payer: Self-pay

## 2021-01-21 DIAGNOSIS — H34831 Tributary (branch) retinal vein occlusion, right eye, with macular edema: Secondary | ICD-10-CM | POA: Diagnosis not present

## 2021-01-21 DIAGNOSIS — E113411 Type 2 diabetes mellitus with severe nonproliferative diabetic retinopathy with macular edema, right eye: Secondary | ICD-10-CM | POA: Diagnosis not present

## 2021-01-21 DIAGNOSIS — H353124 Nonexudative age-related macular degeneration, left eye, advanced atrophic with subfoveal involvement: Secondary | ICD-10-CM | POA: Diagnosis not present

## 2021-01-21 DIAGNOSIS — H43311 Vitreous membranes and strands, right eye: Secondary | ICD-10-CM

## 2021-01-21 MED ORDER — BEVACIZUMAB 2.5 MG/0.1ML IZ SOSY
2.5000 mg | PREFILLED_SYRINGE | INTRAVITREAL | Status: AC | PRN
  Administered 2021-01-21: 2.5 mg via INTRAVITREAL

## 2021-01-21 NOTE — Assessment & Plan Note (Signed)
OD, with vitreous membranes and cobwebs hampering patient's ability to see at times.  She noted that she might be interested in 1 day having a procedure surgically to remove these to clear her view and her only eye with good acuity

## 2021-01-21 NOTE — Progress Notes (Signed)
01/21/2021     CHIEF COMPLAINT Patient presents for Retina Follow Up (5 week FU OD OCT avastin OD/Patient states her vision is a little more blurry, she has noticed reading is harder to see small print. Patient states she notices she has a lot more floaters than other days./Patient states her A1C is 7.1, it will be checked again next week. BS this morning was 91.)   HISTORY OF PRESENT ILLNESS: Virginia Huber is a 75 y.o. female who presents to the clinic today for:   HPI     Retina Follow Up           Diagnosis: CRVO/BRVO   Laterality: right eye   Onset: 5 weeks ago   Severity: mild   Duration: 5 weeks   Comments: 5 week FU OD OCT avastin OD Patient states her vision is a little more blurry, she has noticed reading is harder to see small print. Patient states she notices she has a lot more floaters than other days. Patient states her A1C is 7.1, it will be checked again next week. BS this morning was 91.       Last edited by Laurin Coder, COA on 01/21/2021  3:06 PM.      Referring physician: Sharilyn Sites, MD [address] [place],  [place] [address]  HISTORICAL INFORMATION:   Selected notes from the MEDICAL RECORD NUMBER    Lab Results  Component Value Date   HGBA1C 7.4 (A) 10/22/2020     CURRENT MEDICATIONS: No current outpatient medications on file. (Ophthalmic Drugs)   No current facility-administered medications for this visit. (Ophthalmic Drugs)   Current Outpatient Medications (Other)  Medication Sig   acetaminophen (TYLENOL) 325 MG tablet Take 650 mg by mouth every 6 (six) hours as needed for headache.   amiodarone (PACERONE) 200 MG tablet Take 0.5 tablets (100 mg total) by mouth daily.   apixaban (ELIQUIS) 5 MG TABS tablet Take 1 tablet (5 mg total) by mouth 2 (two) times daily.   atorvastatin (LIPITOR) 80 MG tablet TAKE ONE TABLET BY MOUTH EVERY DAY AT 6:00PM   carvedilol (COREG) 12.5 MG tablet Take 1 tablet (12.5 mg total) by mouth 2 (two)  times daily. (Patient taking differently: Take 12.5 mg by mouth 2 (two) times daily. 1/2 tablet 2 (two) times daily)   Cholecalciferol (VITAMIN D3 PO) Take 1 tablet by mouth daily.    ciprofloxacin (CIPRO) 500 MG tablet Take ONE tablet by MOUTH in THE morning AND ONE tablet in THE evening. DO ALL this FOR 10 DAYS.   clopidogrel (PLAVIX) 75 MG tablet TAKE ONE TABLET (75MG  TOTAL) BY MOUTH DAILY   Continuous Blood Gluc Receiver (FREESTYLE LIBRE 2 READER) DEVI 1 each by Does not apply route daily.   Continuous Blood Gluc Sensor (FREESTYLE LIBRE 2 SENSOR) MISC 1 each by Does not apply route every 14 (fourteen) days.   cyanocobalamin (,VITAMIN B-12,) 1000 MCG/ML injection Inject 1,000 mcg into the muscle every 30 (thirty) days. Last injection was around 08/02/19   diltiazem (CARDIZEM CD) 360 MG 24 hr capsule Take 1 capsule (360 mg total) by mouth daily.   diltiazem (TIAZAC) 360 MG 24 hr capsule TAKE ONE CAPSULE BY MOUTH EVERY MORNING WITH BREAKFAST   Dulaglutide (TRULICITY) 3 SE/8.3TD SOPN Inject 3 mg into the skin once a week.   FREESTYLE LITE test strip 1 each by Other route 2 (two) times daily.    furosemide (LASIX) 40 MG tablet TAKE ONE AND HALF TABLET (60MG   TOTAL) BY MOUTH IN THE MORNING AND ONE TABLET IN THE EVENING (40MG  TOTAL)   hydrALAZINE (APRESOLINE) 50 MG tablet Take 1 tablet (50 mg total) by mouth 3 (three) times daily. (Patient taking differently: Take 50 mg by mouth 2 (two) times daily.)   insulin NPH-regular Human (70-30) 100 UNIT/ML injection Inject 20-30 units 2x a day before meals   levothyroxine (SYNTHROID) 125 MCG tablet Take 1 tablet (125 mcg total) by mouth daily.   NEOMYCIN-POLYMYXIN-HYDROCORTISONE (CORTISPORIN) 1 % SOLN OTIC solution Apply 1-2 drops to nail once daily after shower   nitroGLYCERIN (NITROSTAT) 0.4 MG SL tablet Place 1 tablet (0.4 mg total) under the tongue every 5 (five) minutes as needed for chest pain.   olmesartan (BENICAR) 40 MG tablet Take 40 mg by mouth daily.    potassium chloride SA (KLOR-CON) 20 MEQ tablet TAKE ONE (1) TABLET BY MOUTH EVERY DAY   rOPINIRole (REQUIP) 0.5 MG tablet Take 0.5 mg by mouth at bedtime.    No current facility-administered medications for this visit. (Other)      REVIEW OF SYSTEMS:    ALLERGIES Allergies  Allergen Reactions   Penicillins Hives    Has patient had a PCN reaction causing immediate rash, facial/tongue/throat swelling, SOB or lightheadedness with hypotension: Yes Has patient had a PCN reaction causing severe rash involving mucus membranes or skin necrosis: No Has patient had a PCN reaction that required hospitalization No Has patient had a PCN reaction occurring within the last 10 years: No If all of the above answers are "NO", then may proceed with Cephalosporin use.   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 h/o PAF/flutter during admission in 2013 for PNA. b. PAF during adm for NSTEMI 07/2015, subsequent paroxysms since then.   Atrial fibrillation with rapid ventricular response (HCC)    B12 deficiency anemia    Bradycardia 06/14/2016   Branch retinal vein occlusion with macular edema of right eye 10/11/2019   Bronchospasm 03/02/2012   CAD in native artery 11/30/2014   Chronic renal failure, stage 3b ([place]) 09/23/2017   Coronary artery disease 11/30/2014   a. remote MI. b. h/o PTCA with scoring balloon to OM1 11/2014. c. NSTEMI 03/2015 s/p DES to  prox-mid Cx. d. 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 s/p LE angio 2015; followed by Dr. Fletcher Anon - managed medically.   Pain with urination 05/08/2015   Paroxysmal atrial fibrillation ([place]) 12/08/2017   Pericardial effusion    a. 06/2016 after ppm - s/p pericardiocentesis.   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 s/p Medtronic PPM 06/2016, c/b lead perf/pericardial effusion.   Tamponade    TIA (transient ischemic attack)    Type 2 diabetes with nephropathy ([place]) 02/29/2012   Type II diabetes mellitus (HCC)    Typical atrial flutter (HCC)    UTI (urinary tract  infection) 05/08/2013   Past Surgical History:  Procedure Laterality Date   ABDOMINAL AORTAGRAM N/A 01/03/2014   Procedure: ABDOMINAL [name];  Surgeon: Wellington Hampshire, MD;  Location: [facility] CATH [facility];  Service: Cardiovascular;  Laterality: N/A;   ABDOMINAL HYSTERECTOMY  1972   "partial"   APPENDECTOMY  1970's   CARDIAC CATHETERIZATION  2008   Tiny OM-2 with 90% narrowing. Med tx.   CARDIAC CATHETERIZATION N/A 11/30/2014   Procedure: Left Heart Cath and Coronary Angiography;  Surgeon: Troy Sine, MD; LAD 20%, CFX 50%, OM1 95%, right PLB 30%, LV normal    CARDIAC CATHETERIZATION N/A 11/30/2014   Procedure: Coronary Balloon Angioplasty;  Surgeon: Troy Sine, MD;  Angiosculpt scoring balloon and PTCA to the OM1 reducing stenosis from 95% to less than 10%   CARDIAC CATHETERIZATION N/A 04/03/2015   Procedure: Left Heart Cath and Coronary Angiography;  Surgeon: Jolaine Artist, MD; dLAD 50%, CFX 90%, OM1 100%, PLA 15%, LVEDP 13     CARDIAC CATHETERIZATION N/A 04/03/2015   Procedure: Coronary Stent Intervention;  Surgeon: Sherren Mocha, MD; 3.0x18 mm Xience DES to the CFX     CARDIAC CATHETERIZATION N/A 08/02/2015   Procedure: Left Heart Cath and Coronary Angiography;  Surgeon: Troy Sine, MD;  Location: [facility] CV LAB;  Service: Cardiovascular;  Laterality: N/A;   CARDIAC CATHETERIZATION N/A 08/02/2015   Procedure: Coronary Stent Intervention;  Surgeon: Troy Sine, MD;  Location: [facility];  Service: Cardiovascular;  Laterality: N/A;   CARDIAC CATHETERIZATION N/A 06/25/2016   Procedure: Pericardiocentesis;  Surgeon: Will Meredith Leeds, MD;  Location: [facility];  Service: Cardiovascular;  Laterality: N/A;   cardiac stents     CARDIOVERSION N/A 12/15/2017   Procedure: CARDIOVERSION;  Surgeon: Acie Fredrickson Wonda Cheng, MD;  Location: [facility] ENDOSCOPY;  Service: Cardiovascular;  Laterality: N/A;   CHOLECYSTECTOMY OPEN  1990's   COLONOSCOPY  2005   Dr. Laural Golden: pancolonic  divericula, polyp, path unknown currently   COLONOSCOPY  2012   Dr. Oneida Alar: Normal TI, scattered diverticula in entire colon, small internal hemorrhoids, normal colon biopsies. Colonoscopy in 5-10 years.    COLOSTOMY  05/1979   COLOSTOMY REVERSAL  11/1979   EP IMPLANTABLE DEVICE N/A 06/25/2016   Procedure: Lead Revision/Repair;  Surgeon: Will Meredith Leeds, MD;  Location: [facility] LAB;  Service: Cardiovascular;  Laterality: N/A;   EP IMPLANTABLE DEVICE N/A 06/25/2016   Procedure: Pacemaker Implant;  Surgeon: Will Meredith Leeds, MD;  Location: [facility] CV LAB;  Service: Cardiovascular;  Laterality: N/A;   EXCISIONAL HEMORRHOIDECTOMY  1970's   EYE SURGERY Left 2000   "branch vein occlusion"   EYE SURGERY Left ~ 2001   "smoothed out wrinkle"   LEFT OOPHORECTOMY  05/1979   nicked bowel, peritonitis, colostomy; colostomy reversed 1981    LOWER EXTREMITY ANGIOGRAM N/A 01/03/2014   Procedure: LOWER EXTREMITY ANGIOGRAM;  Surgeon: Wellington Hampshire, MD;  Location: [facility] CATH LAB;  Service: Cardiovascular;  Laterality: N/A;   Nuclear med stress test  10/2011   Small area of mild ischemia inferoapically.   PARTIAL HYSTERECTOMY  1970's   left ovaries, then ovaries removed later due tumors    RIGHT OOPHORECTOMY  1970's    FAMILY HISTORY Family History  Problem Relation Age of Onset   Heart disease Mother        deceased   Heart disease Father        deceased, heart disease   Diabetes Brother    Heart disease Brother    Thyroid disease Brother    Heart disease Sister    Heart disease Brother    Thyroid disease Brother    Lupus Daughter    Colon cancer Neg Hx     SOCIAL HISTORY Social History   Tobacco Use   Smoking status: Never   Smokeless tobacco: Never   Tobacco comments:    Never smoked  Vaping Use   Vaping Use: Never used  Substance Use Topics   Alcohol use: No    Alcohol/week: 0.0 standard drinks   Drug use: No         OPHTHALMIC EXAM:  Base Eye Exam      Visual Acuity (ETDRS)       Right Left   Dist cc 20/50 -2 CF at 3'   Dist ph cc NI NI         Tonometry (Tonopen, 3:17 PM)       Right Left   Pressure 9 9         Pupils       Pupils Dark Light Shape React APD   Right PERRL 4 3 Round Brisk None   Left PERRL 4 3 Round Brisk Trace         Visual Fields (Counting fingers)       Left Right     Full   Restrictions Total superior temporal, inferior temporal, inferior nasal deficiencies          Extraocular Movement       Right Left    Full Full         Neuro/Psych     Oriented x3: Yes   Mood/Affect: Normal         Dilation     Right eye: 1.0% Mydriacyl, 2.5% Phenylephrine @ 3:17 PM           Slit Lamp and Fundus Exam     External Exam       Right Left   External Normal Normal         Slit Lamp Exam       Right Left   Lids/Lashes Normal Normal   Conjunctiva/Sclera White and quiet White and quiet   Cornea Clear Clear   Anterior Chamber Deep and quiet Deep and quiet   Iris  Round and reactive Round and reactive   Lens Posterior chamber intraocular lens Posterior chamber intraocular lens   Anterior Vitreous Normal Normal         Fundus Exam       Right Left   Posterior Vitreous Posterior vitreous detachment, Central vitreous floaters    Disc Normal    C/D Ratio 0.2    Macula Hard drusen,    No Cystoid macular edema clinically, Microaneurysms, Retinal pigment epithelial mottling, no hemorrhage, no exudates    Vessels Old branch retinal vein occlusion    Periphery Normal,  , no retinal hole, no retinal tear             IMAGING AND PROCEDURES  Imaging and Procedures for 01/21/21  OCT, Retina - OU - Both Eyes       Right Eye Quality was good. Scan locations included subfoveal. Central Foveal Thickness: 220. Progression has worsened. Findings include cystoid macular edema.   Left Eye Quality was borderline. Scan locations included subfoveal. Central Foveal Thickness: 213.  Progression has been stable. Findings include subretinal scarring, disciform scar.   Notes Minor perifoveal CME from BRVO persists much less center involvement today, multiple recurrences in this monocular patient, currently at 5-week interval.  Repeat injection Avastin OD today and follow-up again in 5 weeks  OS, no active maculopathy, cicatricial scarring Accounts for acuity     Intravitreal Injection, Pharmacologic Agent - OD - Right Eye       Time Out 01/21/2021. 3:43 PM. Confirmed correct patient, procedure, site, and patient consented.   Anesthesia Topical anesthesia was used. Anesthetic medications included Akten 3.5%.   Procedure Preparation included 10% betadine to eyelids, 5% betadine to ocular surface, Tobramycin 0.3%. A 30 gauge needle was used.   Injection: 2.5 mg bevacizumab 2.5 MG/0.1ML   Route: Intravitreal, Site: Right Eye   NDC: [phone], Lot: [id number]   Post-op Post injection exam found visual acuity of at least counting fingers. The patient tolerated the procedure well. There were no complications. The patient received written and verbal post procedure care education. Post injection medications were not given.              ASSESSMENT/PLAN:  Branch retinal vein occlusion with macular edema of right eye The nature of branch retinal vein occlusion with macular edema was discussed.  The patient was given access to printed information.  The treatment options including continued observation looking for spontaneous resolution versus grid laser versus intravitreal Kenalog injection were discussed.  PRIMARY THERAPY CONSISTS of Anti-VEGF Therapies, AVASTIN, LUCENTIS AND EYLEA.  Their usage was discussed to assist in halting the progression of Macular Edema, in order to preserve, protect or improve acuity.  Additionally, at times, limited focal laser therapy is used in the management.  The risks and benefits of all these options were discussed with the patient.  The  patient's questions were answered.  Severe nonproliferative diabetic retinopathy of right eye, with macular edema, associated with type 2 diabetes mellitus (HCC) Stable OD  Advanced nonexudative age-related macular degeneration of left eye with subfoveal involvement Accounts for the vision  Vitreous membranes and strands, right OD, with vitreous membranes and cobwebs hampering patient's ability to see at times.  She noted that she might be interested in 1 day having a procedure surgically to remove these to clear her view and her only eye with good acuity     ICD-10-CM   1. Branch retinal vein occlusion with macular edema of right eye  H34.8310 OCT,  Retina - OU - Both Eyes    Intravitreal Injection, Pharmacologic Agent - OD - Right Eye    bevacizumab (AVASTIN) SOSY 2.5 mg    2. Severe nonproliferative diabetic retinopathy of right eye, with macular edema, associated with type 2 diabetes mellitus ([place])  E11.3411     3. Advanced nonexudative age-related macular degeneration of left eye with subfoveal involvement  H35.3124     4. Vitreous membranes and strands, right  H43.311       1.  OD, with recurrent BRVO and CME.  Vastly improved today at shorter interval at 5 weeks compared to 7 weeks.  We will repeat injection today  2.  Dilate OD next in 5 weeks likely injection  3.  Vitreous membranes and strands of the right eye hampering patient's ability for long periods of time and occasions.  Patient will consider vitrectomy at some point to remove these materials to maximize visual functioning  Ophthalmic Meds Ordered this visit:  Meds ordered this encounter  Medications   bevacizumab (AVASTIN) SOSY 2.5 mg       Return in about 5 weeks (around 02/25/2021) for dilate, OD.  There are no Patient Instructions on file for this visit.   Explained the diagnoses, plan, and follow up with the patient and they expressed understanding.  Patient expressed understanding of the importance of  proper follow up care.   Clent Demark Trevar Boehringer M.D. Diseases & Surgery of the Retina and Vitreous Retina [facility] Diabetic [place] 01/21/21     Abbreviations: M myopia (nearsighted); A astigmatism; H hyperopia (farsighted); P presbyopia; Mrx spectacle prescription;  CTL contact lenses; OD right eye; OS left eye; OU both eyes  XT exotropia; ET esotropia; PEK punctate epithelial keratitis; PEE punctate epithelial erosions; DES dry eye syndrome; MGD meibomian gland dysfunction; ATs artificial tears; PFAT's preservative free artificial tears; [place] nuclear sclerotic cataract; PSC posterior subcapsular cataract; ERM epi-retinal membrane; PVD posterior vitreous detachment; RD retinal detachment; DM diabetes mellitus; DR diabetic retinopathy; NPDR non-proliferative diabetic retinopathy; PDR proliferative diabetic retinopathy; CSME clinically significant macular edema; DME diabetic macular edema; dbh dot blot hemorrhages; CWS cotton wool spot; POAG primary open angle glaucoma; C/D cup-to-disc ratio; HVF humphrey visual field; GVF goldmann visual field; OCT optical coherence tomography; IOP intraocular pressure; BRVO Branch retinal vein occlusion; CRVO central retinal vein occlusion; CRAO central retinal artery occlusion; BRAO branch retinal artery occlusion; RT retinal tear; SB scleral buckle; PPV pars plana vitrectomy; VH Vitreous hemorrhage; PRP panretinal laser photocoagulation; IVK intravitreal kenalog; VMT vitreomacular traction; MH Macular hole;  NVD neovascularization of the disc; NVE neovascularization elsewhere; AREDS age related eye disease study; ARMD age related macular degeneration; POAG primary open angle glaucoma; EBMD epithelial/anterior basement membrane dystrophy; ACIOL anterior chamber intraocular lens; IOL intraocular lens; PCIOL posterior chamber intraocular lens; Phaco/IOL phacoemulsification with intraocular lens placement; [place] photorefractive keratectomy; LASIK laser assisted in situ keratomileusis;  HTN hypertension; DM diabetes mellitus; COPD chronic obstructive pulmonary disease

## 2021-01-21 NOTE — Assessment & Plan Note (Signed)
Accounts for the vision

## 2021-01-21 NOTE — Assessment & Plan Note (Signed)
The nature of branch retinal vein occlusion with macular edema was discussed.  The patient was given access to printed information.  The treatment options including continued observation looking for spontaneous resolution versus grid laser versus intravitreal Kenalog injection were discussed.  PRIMARY THERAPY CONSISTS of Anti-VEGF Therapies, AVASTIN, LUCENTIS AND EYLEA.  Their usage was discussed to assist in halting the progression of Macular Edema, in order to preserve, protect or improve acuity.  Additionally, at times, limited focal laser therapy is used in the management.  The risks and benefits of all these options were discussed with the patient.  The patient's questions were answered. 

## 2021-01-21 NOTE — Assessment & Plan Note (Signed)
Stable OD °

## 2021-01-30 ENCOUNTER — Encounter: Payer: Self-pay | Admitting: Sports Medicine

## 2021-01-30 ENCOUNTER — Other Ambulatory Visit: Payer: Self-pay

## 2021-01-30 ENCOUNTER — Ambulatory Visit (INDEPENDENT_AMBULATORY_CARE_PROVIDER_SITE_OTHER): Payer: HMO | Admitting: Sports Medicine

## 2021-01-30 DIAGNOSIS — M79674 Pain in right toe(s): Secondary | ICD-10-CM

## 2021-01-30 DIAGNOSIS — E119 Type 2 diabetes mellitus without complications: Secondary | ICD-10-CM

## 2021-01-30 DIAGNOSIS — B351 Tinea unguium: Secondary | ICD-10-CM | POA: Diagnosis not present

## 2021-01-30 DIAGNOSIS — M79675 Pain in left toe(s): Secondary | ICD-10-CM

## 2021-01-30 DIAGNOSIS — Z7901 Long term (current) use of anticoagulants: Secondary | ICD-10-CM

## 2021-01-30 NOTE — Progress Notes (Signed)
Remote pacemaker transmission.   

## 2021-01-30 NOTE — Progress Notes (Signed)
Subjective: Virginia Huber is a 75 y.o. female patient with history of diabetes who presents to office today complaining of long,mildly painful nails while ambulating in shoes; unable to trim. Reports medication has helped the toes. Last A1c was doing good. Next PCP visit next Tuesday.   Patient Active Problem List   Diagnosis Date Noted   Vitreous membranes and strands, right 01/21/2021   Advanced nonexudative age-related macular degeneration of left eye with subfoveal involvement 06/26/2020   Stable central retinal vein occlusion of left eye 05/13/2020   Early stage nonexudative age-related macular degeneration of right eye 02/27/2020   OSA on CPAP 02/12/2020   Branch retinal vein occlusion with macular edema of right eye 10/11/2019   Severe nonproliferative diabetic retinopathy of right eye, with macular edema, associated with type 2 diabetes mellitus ([place]) 10/11/2019   Right retinal defect 10/11/2019   Posterior vitreous detachment of right eye 10/11/2019   Retinal microaneurysm of right eye 10/11/2019   CHF exacerbation ([place]) 08/25/2019   Acute exacerbation of CHF (congestive heart failure) ([place]) 08/24/2019   Acute on chronic diastolic HF (heart failure) ([place]) 12/17/2017   GERD (gastroesophageal reflux disease) 12/17/2017   Typical atrial flutter (HCC)    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   Paroxysmal atrial fibrillation ([place]) 12/08/2017   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   Chronic renal failure, stage 3b ([place]) 09/23/2017   Fever 09/23/2017   S/P pericardiocentesis 06/28/2016   Acute blood loss anemia 06/28/2016   Pericardial effusion 06/26/2016   Tachy-brady syndrome ([place]) 06/25/2016   Tamponade    Bradycardia 06/14/2016   Junctional bradycardia    Coronary artery disease involving coronary bypass graft of native heart with unstable angina pectoris (HCC)    Acute diastolic CHF (congestive heart failure), NYHA class 3 (HCC)     Systolic congestive heart failure ([place]) 05/13/2016   Multiple and bilateral precerebral artery syndromes 05/13/2016   OSA (obstructive sleep apnea) 05/13/2016   Chronic diarrhea 12/25/2015   Chest pain 08/02/2015   Atrial fibrillation with rapid ventricular response (HCC)    Pain with urination 05/08/2015   NSTEMI (non-ST elevated myocardial infarction) ([place]) 04/02/2015   CAD in native artery 11/30/2014   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    PAD (peripheral artery disease) ([place]) 12/26/2013   Superficial fungus infection of skin 06/29/2013   UTI (urinary tract infection) 05/08/2013   Hypokalemia 03/05/2012   B12 deficiency anemia 03/02/2012   Bronchospasm 03/02/2012   Community acquired bacterial pneumonia 03/01/2012   Acute respiratory failure with hypoxia ([place]) 03/01/2012   Type 2 diabetes with nephropathy ([place]) 02/29/2012   Hypothyroidism 02/28/2012   RLQ abdominal pain 11/24/2010   OVERWEIGHT/OBESITY 06/03/2010   Essential hypertension 06/03/2010   Overweight 06/03/2010   Mixed hyperlipidemia 12/27/2009   Palpitations 05/17/2009   FRACTURE, TOE 12/06/2007   Current Outpatient Medications on File Prior to Visit  Medication Sig Dispense Refill   acetaminophen (TYLENOL) 325 MG tablet Take 650 mg by mouth every 6 (six) hours as needed for headache.     amiodarone (PACERONE) 200 MG tablet Take 0.5 tablets (100 mg total) by mouth daily. 45 tablet 1   apixaban (ELIQUIS) 5 MG TABS tablet Take 1 tablet (5 mg total) by mouth 2 (two) times daily. 28 tablet 0   atorvastatin (LIPITOR) 80 MG tablet TAKE ONE TABLET BY MOUTH EVERY DAY AT 6:00PM 90 tablet 3   Cholecalciferol (VITAMIN D3 PO) Take 1  tablet by mouth daily.      ciprofloxacin (CIPRO) 500 MG tablet Take ONE tablet by MOUTH in THE morning AND ONE tablet in THE evening. DO ALL this FOR 10 DAYS.     clopidogrel (PLAVIX) 75 MG tablet TAKE ONE TABLET (75MG  TOTAL) BY MOUTH DAILY  90 tablet 3   Continuous Blood Gluc Receiver (FREESTYLE LIBRE 2 READER) DEVI 1 each by Does not apply route daily. 1 each 0   Continuous Blood Gluc Sensor (FREESTYLE LIBRE 2 SENSOR) MISC 1 each by Does not apply route every 14 (fourteen) days. 6 each 3   cyanocobalamin (,VITAMIN B-12,) 1000 MCG/ML injection Inject 1,000 mcg into the muscle every 30 (thirty) days. Last injection was around 08/02/19     diltiazem (CARDIZEM CD) 360 MG 24 hr capsule Take 1 capsule (360 mg total) by mouth daily. 90 capsule 3   diltiazem (TIAZAC) 360 MG 24 hr capsule TAKE ONE CAPSULE BY MOUTH EVERY MORNING WITH BREAKFAST     Dulaglutide (TRULICITY) 3 ZM/6.2HU SOPN Inject 3 mg into the skin once a week. 6 mL 3   FREESTYLE LITE test strip 1 each by Other route 2 (two) times daily.      furosemide (LASIX) 40 MG tablet TAKE ONE AND HALF TABLET (60MG  TOTAL) BY MOUTH IN THE MORNING AND ONE TABLET IN THE EVENING (40MG  TOTAL) 180 tablet 4   insulin NPH-regular Human (70-30) 100 UNIT/ML injection Inject 20-30 units 2x a day before meals 45 mL 3   levothyroxine (SYNTHROID) 125 MCG tablet Take 1 tablet (125 mcg total) by mouth daily. 90 tablet 3   NEOMYCIN-POLYMYXIN-HYDROCORTISONE (CORTISPORIN) 1 % SOLN OTIC solution Apply 1-2 drops to nail once daily after shower 10 mL 0   nitroGLYCERIN (NITROSTAT) 0.4 MG SL tablet Place 1 tablet (0.4 mg total) under the tongue every 5 (five) minutes as needed for chest pain. 25 tablet 0   olmesartan (BENICAR) 40 MG tablet Take 40 mg by mouth daily.     potassium chloride SA (KLOR-CON) 20 MEQ tablet TAKE ONE (1) TABLET BY MOUTH EVERY DAY 90 tablet 1   rOPINIRole (REQUIP) 0.5 MG tablet Take 0.5 mg by mouth at bedtime.   0   carvedilol (COREG) 12.5 MG tablet Take 1 tablet (12.5 mg total) by mouth 2 (two) times daily. (Patient taking differently: Take 12.5 mg by mouth 2 (two) times daily. 1/2 tablet 2 (two) times daily) 180 tablet 3   hydrALAZINE (APRESOLINE) 50 MG tablet Take 1 tablet (50 mg total) by  mouth 3 (three) times daily. (Patient taking differently: Take 50 mg by mouth 2 (two) times daily.) 270 tablet 3   No current facility-administered medications on file prior to visit.   Allergies  Allergen Reactions   Penicillins Hives    Has patient had a PCN reaction causing immediate rash, facial/tongue/throat swelling, SOB or lightheadedness with hypotension: Yes Has patient had a PCN reaction causing severe rash involving mucus membranes or skin necrosis: No Has patient had a PCN reaction that required hospitalization No Has patient had a PCN reaction occurring within the last 10 years: No If all of the above answers are "NO", then may proceed with Cephalosporin use.   Percocet [Oxycodone-Acetaminophen] Nausea And Vomiting     Objective: General: Patient is awake, alert, and oriented x 3 and in no acute distress.  Integument: Skin is warm, dry and supple bilateral. Nails are tender, long, thickened and dystrophic with subungual debris, consistent with onychomycosis, 1-5 bilateral. Mild curvature of bilateral  hallux nails.  No pain with trimming this visit.  No acute signs of infection. No open lesions or preulcerative lesions present bilateral. Remaining integument unremarkable.  Vasculature:  Dorsalis Pedis pulse 1/4 bilateral. Posterior Tibial pulse  0/4 bilateral.  Capillary fill time <3 sec 1-5 bilateral. Positive hair growth to the level of the digits. Temperature gradient within normal limits. No varicosities present bilateral. Trace edema present bilateral.   Neurology: Gross sensation present via light touch bilateral.  Protective sensation intact with Semmes Weinstein monofilament bilateral.  Musculoskeletal: Asymptomatic bunion and hammertoe with cross over R>L pedal deformities noted bilateral.  Strength within normal limits.  No other acute findings or tenderness to palpation bilateral.  Assessment and Plan: Problem List Items Addressed This Visit   None Visit  Diagnoses     Pain due to onychomycosis of toenails of both feet    -  Primary   Diabetes mellitus without complication ([place])       Anticoagulated          -Examined patient. -Mechanically debrided all nails 1-5 bilateral using sterile nail nipper and filed with dremel without incident  -Advised patient if there is soreness again at her bilateral hallux nails may use Corticosporin as needed -Advised good supportive shoes daily for foot type -Answered all patient questions -Patient to return  in 2-1/2-3 months for at risk foot care -Patient advised to call the office if any problems or questions arise in the meantime.  Landis Martins, DPM

## 2021-02-04 ENCOUNTER — Encounter: Payer: Self-pay | Admitting: Internal Medicine

## 2021-02-04 ENCOUNTER — Ambulatory Visit (INDEPENDENT_AMBULATORY_CARE_PROVIDER_SITE_OTHER): Payer: HMO | Admitting: Internal Medicine

## 2021-02-04 ENCOUNTER — Other Ambulatory Visit: Payer: Self-pay

## 2021-02-04 VITALS — BP 120/70 | HR 78 | Ht 63.0 in | Wt 150.8 lb

## 2021-02-04 DIAGNOSIS — E1159 Type 2 diabetes mellitus with other circulatory complications: Secondary | ICD-10-CM

## 2021-02-04 DIAGNOSIS — E785 Hyperlipidemia, unspecified: Secondary | ICD-10-CM

## 2021-02-04 DIAGNOSIS — E039 Hypothyroidism, unspecified: Secondary | ICD-10-CM | POA: Diagnosis not present

## 2021-02-04 DIAGNOSIS — E1165 Type 2 diabetes mellitus with hyperglycemia: Secondary | ICD-10-CM

## 2021-02-04 LAB — TSH: TSH: 0.4 u[IU]/mL (ref 0.35–5.50)

## 2021-02-04 LAB — T4, FREE: Free T4: 1.65 ng/dL — ABNORMAL HIGH (ref 0.60–1.60)

## 2021-02-04 LAB — POCT GLYCOSYLATED HEMOGLOBIN (HGB A1C): Hemoglobin A1C: 7.7 % — AB (ref 4.0–5.6)

## 2021-02-04 NOTE — Progress Notes (Signed)
Patient ID: Virginia Huber, female   DOB: 03/04/1946, 75 y.o.   MRN: [id number]   This visit occurred during the SARS-CoV-2 public health emergency.  Safety protocols were in place, including screening questions prior to the visit, additional usage of staff PPE, and extensive cleaning of exam room while observing appropriate contact time as indicated for disinfecting solutions.    HPI: Virginia Huber is a 75 y.o.-year-old female, initially referred by her PCP, Dr.Golding, returning for follow-up for DM2, dx in ~2005, insulin-dependent since 2006, uncontrolled, with complications (CAD, s/p NSTEMI, s/p PTCA and DES; CHF; PAF/flutter; PAD; CKD; cerebrovascular disease - s/p TIA; PN).  She previously saw my colleague, Dr. Loanne Drilling.  Our last visit was 3.5 months ago.  Interim history: No increased urination, nausea, chest pain.  She continues to have blurry vision and gets intraocular injections.  DM2: Reviewed HbA1c levels: Lab Results  Component Value Date   HGBA1C 7.4 (A) 10/22/2020   HGBA1C 6.7 (A) 07/23/2020   HGBA1C 7.3 (A) 04/15/2020   HGBA1C 8.7 (A) 02/14/2020   HGBA1C 7.7 12/04/2019   HGBA1C 7.0 (H) 08/24/2019   HGBA1C 6.8 (H) 12/17/2017   HGBA1C 6.6 (H) 08/05/2017   HGBA1C 6.9 (H) 04/30/2017   HGBA1C 6.9 (H) 01/25/2017   Pt is on a regimen of: - NPH/R 70/30 20-22 units in am and 28-30 units before dinner - Trulicity 3 mg weekly No FH of MTC or personal h/o pancreatitis. She was on Metformin >> stopped b/c CKD.  Pt checks her sugars 4x a day with the Libre 2 CGM:   Previously: - am: 78,  92-208 >> 92-139 >> 58, 62, 102-186, 200 - 2h after b'fast: n/c - before lunch: n/c - 2h after lunch: n/c >> 96 >> 130, 150 - before dinner: 87-177 >> 87-132 >> 63-150, 168, 170 - 2h after dinner: n/c - bedtime: n/c >> 66, 72, 116, 138 - nighttime: n/c Lowest sugar was 66 (before decreasing insulin dose) >> 58 (Cipro) >> 59 (CGM); she has hypoglycemia awareness in the 70s.  Highest  sugar was 208 >> 200 >> 208.  Glucometer: Freestyle lite  Patient starting to improve her meals after her last HbA1c returned: - Brunch: scrambled egg + bacon + rye bread or cheerios + skim milk - Dinner: meat - grilled + veggies - Snacks: seldom - apple Drinks occasional diet drinks  -+ CKD- Dr. Theador Hawthorne, last BUN/creatinine:   Ref Range & Units 12/26/2020  Glucose 65 - 99 mg/dL 238 High    BUN 8 - 27 mg/dL 31 High    Creatinine 0.57 - 1.00 mg/dL 1.65 High    eGFR CKD-EPI CR 2021 >59 mL/min/1.73 32 Low     Lab Results  Component Value Date   BUN 30 (H) 08/22/2020   BUN 24 (A) 12/04/2019   CREATININE 1.79 (H) 08/22/2020   CREATININE 1.5 (A) 12/04/2019  06/24/2020: BUN/creatinine 24/1.7 On Benicar 40.  -+ HL; last set of lipids: 07/17/2020: 121/146/38/58 Lab Results  Component Value Date   CHOL 166 08/05/2017   HDL 43 (L) 08/05/2017   LDLCALC 96 08/05/2017   TRIG 160 (H) 08/05/2017   CHOLHDL 3.9 08/05/2017  On Lipitor 80.  - last eye exam was on 06/26/2020: + DR. Dr Zadie Rhine.  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  - she has numbness and tingling in her feet.  Pt has FH of DM in M and her family.  Hypothyroidism:  Pt is  on levothyroxine  125 mcg daily (dose decreased to 10/2020), taken: - in am - fasting - at least 30 min from b'fast - no calcium - no iron - no multivitamins - no PPIs - not on Biotin  TSH levels were reviewed and the TSH was suppressed: Lab Results  Component Value Date   TSH 0.22 (L) 10/22/2020   TSH 0.233 (L) 08/22/2020   TSH 2.410 02/22/2020   TSH 0.424 (L) 08/15/2019   TSH 0.059 (L) 12/17/2017   TSH 0.05 (L) 12/06/2017   TSH 11.574 (H) 09/23/2017   TSH 12.65 (H) 08/05/2017   TSH 4.97 (H) 04/30/2017   TSH 5.64 (H) 01/25/2017   She has an extensive cardiac history.  Besides mentioned in HPI, she also has a history of heart block, tachybradycardia syndrome, pericardial effusion (history of tamponade).   She is on amiodarone, Eliquis, Plavix, Lasix.    ROS: Constitutional: no weight gain/no weight loss, no fatigue, no subjective hyperthermia, no subjective hypothermia Eyes: + blurry vision, no xerophthalmia ENT: no sore throat, no nodules palpated in neck, no dysphagia, no odynophagia, no hoarseness Cardiovascular: no CP/no SOB/no palpitations/no leg swelling Respiratory: no cough/no SOB/no wheezing Gastrointestinal: no N/no V/no D/no C/no acid reflux Musculoskeletal: no muscle aches/no joint aches Skin: no rashes, no hair loss Neurological: no tremors/no numbness/no tingling/no dizziness  I reviewed pt's medications, allergies, PMH, social hx, family hx, and changes were documented in the history of present illness. Otherwise, unchanged from my initial visit note.  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 h/o PAF/flutter during admission in 2013 for PNA. b. PAF during adm for NSTEMI 07/2015, subsequent paroxysms since then.   Atrial fibrillation with rapid ventricular response (HCC)    B12 deficiency anemia    Bradycardia 06/14/2016   Branch retinal vein occlusion with macular edema of right eye 10/11/2019   Bronchospasm 03/02/2012   CAD in native artery 11/30/2014   Chronic renal failure, stage 3b ([place]) 09/23/2017   Coronary artery disease 11/30/2014   a. remote MI. b. h/o PTCA with scoring balloon to OM1 11/2014. c. NSTEMI 03/2015 s/p DES to prox-mid Cx. d. 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 02/27/2020   Essential hypertension    FRACTURE, TOE 12/06/2007   Qualifier: Diagnosis of  By: Aline Brochure MD, Dorothyann Peng     GERD (gastroesophageal reflux disease)    History of blood transfusion 1980's   2nd surgical procedures   Hypercholesteremia    Hypokalemia 03/05/2012   Hypothyroidism    Multiple and bilateral precerebral artery syndromes 05/13/2016   Myocardial infarction ([place] ) 02/2012   NSTEMI (non-ST elevated myocardial infarction) ([place]) 04/02/2015   OSA (obstructive sleep apnea) 05/13/2016   Ovarian tumor  PAD (peripheral artery disease) ([place])    a. s/p LE angio 2015; followed by Dr. Fletcher Anon - managed medically.   Pain with urination 05/08/2015   Paroxysmal atrial fibrillation ([place]) 12/08/2017   Pericardial effusion    a. 06/2016 after ppm - s/p pericardiocentesis.   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 s/p Medtronic PPM 06/2016, c/b lead perf/pericardial effusion.   Tamponade    TIA (transient ischemic attack)    Type 2 diabetes with nephropathy ([place]) 02/29/2012   Type II diabetes mellitus (HCC)    Typical atrial flutter (HCC)    UTI (urinary tract infection) 05/08/2013   Past Surgical History:  Procedure Laterality Date   ABDOMINAL  AORTAGRAM N/A 01/03/2014   Procedure: ABDOMINAL [name];  Surgeon: Wellington Hampshire, MD;  Location: [facility]ATH [facility];  Service: Cardiovascular;  Laterality: N/A;   ABDOMINAL HYSTERECTOMY  1972   "partial"   APPENDECTOMY  1970's   CARDIAC CATHETERIZATION  2008   Tiny OM-2 with 90% narrowing. Med tx.   CARDIAC CATHETERIZATION N/A 11/30/2014   Procedure: Left Heart Cath and Coronary Angiography;  Surgeon: Troy Sine, MD; LAD 20%, CFX 50%, OM1 95%, right PLB 30%, LV normal    CARDIAC CATHETERIZATION N/A 11/30/2014   Procedure: Coronary Balloon Angioplasty;  Surgeon: Troy Sine, MD;  Angiosculpt scoring balloon and PTCA to the OM1 reducing stenosis from 95% to less than 10%   CARDIAC CATHETERIZATION N/A 04/03/2015   Procedure: Left Heart Cath and Coronary Angiography;  Surgeon: Jolaine Artist, MD; dLAD 50%, CFX 90%, OM1 100%, PLA 15%, LVEDP 13     CARDIAC CATHETERIZATION N/A 04/03/2015   Procedure: Coronary Stent Intervention;  Surgeon: Sherren Mocha, MD; 3.0x18 mm Xience DES to the CFX     CARDIAC CATHETERIZATION N/A 08/02/2015   Procedure: Left Heart Cath and Coronary Angiography;  Surgeon: Troy Sine, MD;  Location: [facility];  Service: Cardiovascular;  Laterality: N/A;   CARDIAC CATHETERIZATION N/A 08/02/2015   Procedure: Coronary Stent Intervention;  Surgeon: Troy Sine, MD;  Location: [facility];  Service: Cardiovascular;  Laterality: N/A;   CARDIAC CATHETERIZATION N/A 06/25/2016   Procedure: Pericardiocentesis;  Surgeon: Will Meredith Leeds, MD;  Location: [facility];  Service: Cardiovascular;  Laterality: N/A;   cardiac stents     CARDIOVERSION N/A 12/15/2017   Procedure: CARDIOVERSION;  Surgeon: Acie Fredrickson Wonda Cheng, MD;  Location: [facility];  Service: Cardiovascular;  Laterality: N/A;   CHOLECYSTECTOMY OPEN  1990's   COLONOSCOPY  2005   Dr. Laural Golden: pancolonic divericula, polyp, path unknown currently   COLONOSCOPY  2012   Dr. Oneida Alar: Normal TI,  scattered diverticula in entire colon, small internal hemorrhoids, normal colon biopsies. Colonoscopy in 5-10 years.    COLOSTOMY  05/1979   COLOSTOMY REVERSAL  11/1979   EP IMPLANTABLE DEVICE N/A 06/25/2016   Procedure: Lead Revision/Repair;  Surgeon: Will Meredith Leeds, MD;  Location: [facility];  Service: Cardiovascular;  Laterality: N/A;   EP IMPLANTABLE DEVICE N/A 06/25/2016   Procedure: Pacemaker Implant;  Surgeon: Will Hassell Done  Camnitz, MD;  Location: [facility];  Service: Cardiovascular;  Laterality: N/A;   EXCISIONAL HEMORRHOIDECTOMY  1970's   EYE SURGERY Left 2000   "branch vein occlusion"   EYE SURGERY Left ~ 2001   "smoothed out wrinkle"   LEFT OOPHORECTOMY  05/1979   nicked bowel, peritonitis, colostomy; colostomy reversed 1981    LOWER EXTREMITY ANGIOGRAM N/A 01/03/2014   Procedure: LOWER EXTREMITY ANGIOGRAM;  Surgeon: Wellington Hampshire, MD;  Location: [facility];  Service: Cardiovascular;  Laterality: N/A;   Nuclear med stress test  10/2011   Small area of mild ischemia inferoapically.   PARTIAL HYSTERECTOMY  1970's   left ovaries, then ovaries removed later due tumors    RIGHT OOPHORECTOMY  1970's   Social History   Socioeconomic History   Marital status: Married    Spouse name: Not on file   Number of children: Not on file   Years of education: Not on file   Highest education level: Not on file  Occupational History   Occupation: Retired    Fish farm manager: RETIRED    Comment: insurance billing  Tobacco Use   Smoking status: Never   Smokeless tobacco: Never   Tobacco comments:    Never smoked  Vaping Use   Vaping Use: Never used  Substance and Sexual Activity   Alcohol use: No    Alcohol/week: 0.0 standard drinks   Drug use: No   Sexual activity: Never    Birth control/protection: Surgical    Comment: hyst  Other Topics Concern   Not on file  Social History Narrative   Not on file   Social Determinants of Health   Financial Resource Strain: Not  on file  Food Insecurity: Not on file  Transportation Needs: Not on file  Physical Activity: Not on file  Stress: Not on file  Social Connections: Not on file  Intimate Partner Violence: Not on file   Current Outpatient Medications on File Prior to Visit  Medication Sig Dispense Refill   acetaminophen (TYLENOL) 325 MG tablet Take 650 mg by mouth every 6 (six) hours as needed for headache.     amiodarone (PACERONE) 200 MG tablet Take 0.5 tablets (100 mg total) by mouth daily. 45 tablet 1   apixaban (ELIQUIS) 5 MG TABS tablet Take 1 tablet (5 mg total) by mouth 2 (two) times daily. 28 tablet 0   atorvastatin (LIPITOR) 80 MG tablet TAKE ONE TABLET BY MOUTH EVERY DAY AT 6:00PM 90 tablet 3   carvedilol (COREG) 12.5 MG tablet Take 1 tablet (12.5 mg total) by mouth 2 (two) times daily. (Patient taking differently: Take 12.5 mg by mouth 2 (two) times daily. 1/2 tablet 2 (two) times daily) 180 tablet 3   Cholecalciferol (VITAMIN D3 PO) Take 1 tablet by mouth daily.      ciprofloxacin (CIPRO) 500 MG tablet Take ONE tablet by MOUTH in THE morning AND ONE tablet in THE evening. DO ALL this FOR 10 DAYS.     clopidogrel (PLAVIX) 75 MG tablet TAKE ONE TABLET (75MG TOTAL) BY MOUTH DAILY 90 tablet 3   Continuous Blood Gluc Receiver (FREESTYLE LIBRE 2 READER) DEVI 1 each by Does not apply route daily. 1 each 0   Continuous Blood Gluc Sensor (FREESTYLE LIBRE 2 SENSOR) MISC 1 each by Does not apply route every 14 (fourteen) days. 6 each 3   cyanocobalamin (,VITAMIN B-12,) 1000 MCG/ML injection Inject 1,000 mcg into the muscle every 30 (thirty) days. Last injection was around 08/02/19  diltiazem (CARDIZEM CD) 360 MG 24 hr capsule Take 1 capsule (360 mg total) by mouth daily. 90 capsule 3   diltiazem (TIAZAC) 360 MG 24 hr capsule TAKE ONE CAPSULE BY MOUTH EVERY MORNING WITH BREAKFAST     Dulaglutide (TRULICITY) 3 HK/7.4QV SOPN Inject 3 mg into the skin once a week. 6 mL 3   FREESTYLE LITE test strip 1 each by  Other route 2 (two) times daily.      furosemide (LASIX) 40 MG tablet TAKE ONE AND HALF TABLET (60MG TOTAL) BY MOUTH IN THE MORNING AND ONE TABLET IN THE EVENING (40MG TOTAL) 180 tablet 4   hydrALAZINE (APRESOLINE) 50 MG tablet Take 1 tablet (50 mg total) by mouth 3 (three) times daily. (Patient taking differently: Take 50 mg by mouth 2 (two) times daily.) 270 tablet 3   insulin NPH-regular Human (70-30) 100 UNIT/ML injection Inject 20-30 units 2x a day before meals 45 mL 3   levothyroxine (SYNTHROID) 125 MCG tablet Take 1 tablet (125 mcg total) by mouth daily. 90 tablet 3   NEOMYCIN-POLYMYXIN-HYDROCORTISONE (CORTISPORIN) 1 % SOLN OTIC solution Apply 1-2 drops to nail once daily after shower 10 mL 0   nitroGLYCERIN (NITROSTAT) 0.4 MG SL tablet Place 1 tablet (0.4 mg total) under the tongue every 5 (five) minutes as needed for chest pain. 25 tablet 0   olmesartan (BENICAR) 40 MG tablet Take 40 mg by mouth daily.     potassium chloride SA (KLOR-CON) 20 MEQ tablet TAKE ONE (1) TABLET BY MOUTH EVERY DAY 90 tablet 1   rOPINIRole (REQUIP) 0.5 MG tablet Take 0.5 mg by mouth at bedtime.   0   No current facility-administered medications on file prior to visit.   Allergies  Allergen Reactions   Penicillins Hives    Has patient had a PCN reaction causing immediate rash, facial/tongue/throat swelling, SOB or lightheadedness with hypotension: Yes Has patient had a PCN reaction causing severe rash involving mucus membranes or skin necrosis: No Has patient had a PCN reaction that required hospitalization No Has patient had a PCN reaction occurring within the last 10 years: No If all of the above answers are "NO", then may proceed with Cephalosporin use.   Percocet [Oxycodone-Acetaminophen] Nausea And Vomiting   Family History  Problem Relation Age of Onset   Heart disease Mother        deceased   Heart disease Father        deceased, heart disease   Diabetes Brother    Heart disease Brother     Thyroid disease Brother    Heart disease Sister    Heart disease Brother    Thyroid disease Brother    Lupus Daughter    Colon cancer Neg Hx    PE: BP 120/70 (BP Location: Left Arm, Patient Position: Sitting, Cuff Size: Normal)   Pulse 78   Ht _0  (1.6 m)   Wt 150 lb 12.8 oz (68.4 kg)   SpO2 96%   BMI 26.71 kg/m  Wt Readings from Last 3 Encounters:  02/04/21 150 lb 12.8 oz (68.4 kg)  10/22/20 154 lb 6.4 oz (70 kg)  08/22/20 154 lb (69.9 kg)   Constitutional: Slightly overweight, in NAD Eyes: PERRLA, EOMI, no exophthalmos ENT: moist mucous membranes, no thyromegaly, no cervical lymphadenopathy Cardiovascular: RRR, No MRG Respiratory: CTA B Gastrointestinal: abdomen soft, NT, ND, BS+ Musculoskeletal: no deformities, strength intact in all 4 Skin: moist, warm, no rashes Neurological: no tremor with outstretched hands, DTR normal in all  4  ASSESSMENT: 1. DM2, insulin-dependent, uncontrolled, withcomplications - CAD, s/p NSTEMI 07/2015 and 03/2015, s/p PTCA and DES - CHF - PAF/flutter - PAD - cerebrovascular disease - s/p TIA - CKD  2. HL  3.  Hypothyroidism  PLAN:  1. Patient with longstanding, uncontrolled, type 2 diabetes, on premixed insulin regimen and weekly GLP-1 receptor agonist, with improved control in the last year, but the higher HbA1c at last visit, at 7.4%, increased from 6.7%. -At last visit, she forgot her freestyle libre 2 CGM receiver and we could not analyze the tracings.  However, she was writing down her blood sugars and I reviewed her logs.  Sugars are fluctuating, as low as 50 8 in the morning when she was on ciprofloxacin for UTI.  Otherwise, sugars are mostly at goal, with occasional hyperglycemic values, but overall higher than before.  I suggested to decrease the a.m. premixed insulin and increase the Trulicity dosage since she was tolerating it well. -In the past, I also advised her to discuss with her nephrologist whether we could use an SGLT2  inhibitor.  She saw Dr. Theador Hawthorne in 04/2020 and he wanted [place] to hold off using an SGLT2 inhibitor as her kidney function was trending down.  Since then, however, she saw him again on 06/25/2020, but the topic was not addressed again.  Her kidney function at that time was better.  She had another kidney function in 08/2020, fairly stable.  A repeat GFR was better in 12/2020. CGM interpretation: -At today's visit, we reviewed her CGM downloads: It appears that 89% of values are in target range (goal >70%), while 7% are higher than 180 (goal <25%), and 4% are lower than 70 (goal <4%).  The calculated average blood sugar is 122.  The projected HbA1c for the next 3 months (GMI) is 6.2%. -Reviewing the CGM trends, the sugars appear to be mostly at goal, with few exceptions.  She also had occasional lower blood sugars, with the lowest being 59 per CGM readings.  I explained that these may be higher when she checks with her meter.  However, due to the lower blood sugars during the night, I advised her to decrease the dose of premixed insulin before dinner.  We can continue the rest of the regimen. -At today's visit we also discussed about managing her diabetic regimen before her eye surgery and also her colonoscopy.  I advised her to to drink liquids with sugar in the morning and before dinner in the day prior to her colonoscopy and take a low-dose of premixed insulin before these.  I also advised her to skip the a.m. dose of insulin in the day of colonoscopy. - I suggested to:  Patient Instructions  Please decrease: - NPH/R 70/30 20-22 units in am and 24-26 units before dinner  The day before colonoscopy, take only: - NPH/R 10 units in am and 10 units in pm  Skip am dose of insulin the day of colonoscopy.  Please continue: - Trulicity 3 mg weekly  Continue Levothyroxine 125 mcg daily.  Take the thyroid hormone every day, with water, at least 30 minutes before breakfast, separated by at least 4 hours  from: - acid reflux medications - calcium - iron - multivitamins  Please return in 4 months.   - we checked her HbA1c: 7.7% (higher) - advised to check sugars at different times of the day - 4x a day, rotating check times - advised for yearly eye exams >> she is UTD - return  to clinic in 3-4 months  2. HL - Reviewed latest lipid panel from 06/2020: 121/146/38/58 - LDL at goal, HDL slightly low - Continues Lipitor 80 mg daily without side effects  3.  Hypothyroidism - latest thyroid labs reviewed with pt. >> TSH slightly low: Lab Results  Component Value Date   TSH 0.22 (L) 10/22/2020  - she continues on LT4 125 mcg daily, dose decreased from 137 mcg daily after the above results returned - pt feels good on this dose. - we discussed about taking the thyroid hormone every day, with water, >30 minutes before breakfast, separated by >4 hours from acid reflux medications, calcium, iron, multivitamins. Pt. is taking it correctly. - will check thyroid tests today: TSH and fT4 - If labs are abnormal, she will need to return for repeat TFTs in 1.5 months  Component     Latest Ref Rng & Units 02/04/2021  T4,Free(Direct)     0.60 - 1.60 ng/dL 1.65 (H)  TSH     0.35 - 5.50 uIU/mL 0.40  TSH is normal.  The free T4 could be high due to taking levothyroxine close to testing time.  I would suggest to continue the same dose of levothyroxine for now.  Philemon Kingdom, MD PhD [facility] Endocrinology

## 2021-02-04 NOTE — Patient Instructions (Addendum)
Please decrease: - NPH/R 70/30 20-22 units in am and 24-26 units before dinner  The day before colonoscopy, take only: - NPH/R 10 units in am and 10 units in pm  Skip am dose of insulin the day of colonoscopy.  Please continue: - Trulicity 3 mg weekly  Continue Levothyroxine 125 mcg daily.  Take the thyroid hormone every day, with water, at least 30 minutes before breakfast, separated by at least 4 hours from: - acid reflux medications - calcium - iron - multivitamins  Please return in 4 months.

## 2021-02-12 ENCOUNTER — Ambulatory Visit: Payer: PPO | Admitting: Family Medicine

## 2021-02-17 ENCOUNTER — Ambulatory Visit: Payer: HMO | Admitting: Cardiology

## 2021-02-17 NOTE — Progress Notes (Signed)
Cardiology Office Note    Date:  02/18/2021   ID:  Virginia Huber, Virginia Huber [birth year], MRN [id number]  PCP:  Sharilyn Sites, MD  Cardiologist: Rozann Lesches, MD   EP: Dr. Curt Bears  Chief Complaint  Patient presents with   Follow-up    6 month visit    History of Present Illness:    Virginia Huber is a 75 y.o. female with past medical history of CAD (s/p PTCA of OM1 in 2016, DES to LCx in 2016, DES to RCA in 2017), HFpEF, PAF (on Eliquis, s/p DCCV in 11/2017), tachy-brady syndrome (s/p Medtronic PPM placement in 2018), OSA (on CPAP), HTN, HLD, Stage 3 CKD, macular degeneration and IDDM who presents to the office today for 24-month follow-up.   She was last examined by Dr. Domenic Polite in 07/2020 and reported intermittent palpitations but denied any exertional chest pain or worsening dyspnea. No changes were made to her medication regimen at that time. She did see Dr. Curt Bears in 08/2020 and reported fatigue and he thought Coreg could be contributing, therefore this was reduced to 12.5mg  BID and Hydralazine was increased to 50mg  TID.    In talking with the patient today, she reports still having fatigue which has been occurring for 1-1.5+ years and did not notice improvement in her symptoms when Coreg was reduced at her prior office visit. She denies any exertional chest pain but does have baseline dyspnea on exertion.  Reports some intermittent episodes of discomfort along her PPM site which occurs at night and spontaneously resolves. Denies any specific orthopnea, PND or pitting edema.  Her BP has been well controlled when checked at home and is well controlled at 132/56 during today's visit.   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 h/o PAF/flutter during admission in 2013 for PNA. b. PAF during adm for NSTEMI 07/2015, subsequent paroxysms since then.   Atrial fibrillation with rapid ventricular response (HCC)    B12 deficiency anemia    Bradycardia 06/14/2016   Branch retinal vein occlusion with macular edema of right eye 10/11/2019   Bronchospasm 03/02/2012   CAD in native artery 11/30/2014   Chronic renal failure, stage 3b ([place]) 09/23/2017   Coronary artery disease 11/30/2014   a. remote MI. b. h/o PTCA with scoring balloon to OM1 11/2014. c. NSTEMI 03/2015 s/p DES to prox-mid Cx. d. NSTEMI 07/2015 s/p scoring balloon/PTCA/DES to dRCA with PAF during that admission   Coronary artery disease involving coronary bypass graft of native heart with unstable angina pectoris ([place])    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    Cutaneous lupus erythematosus    Early stage nonexudative age-related macular degeneration of right eye 02/27/2020   Essential hypertension    FRACTURE, TOE 12/06/2007   Qualifier: Diagnosis of  By: Aline Brochure MD, Dorothyann Peng     GERD (gastroesophageal reflux disease)    History of blood transfusion 1980's   2nd surgical procedures   Hypercholesteremia    Hypokalemia 03/05/2012  Hypothyroidism    Multiple and bilateral precerebral artery syndromes 05/13/2016   Myocardial infarction [facility]) 02/2012   NSTEMI (non-ST elevated myocardial infarction) ([place]) 04/02/2015   OSA (obstructive sleep apnea) 05/13/2016   Ovarian tumor    PAD (peripheral artery disease) ([place])    a. s/p LE angio 2015; followed by Dr. Fletcher Anon - managed medically.   Pain with urination 05/08/2015   Paroxysmal atrial fibrillation ([place]) 12/08/2017   Pericardial effusion    a. 06/2016  after ppm - s/p pericardiocentesis.   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 s/p Medtronic PPM 06/2016, c/b lead perf/pericardial effusion.   Tamponade    TIA (transient ischemic attack)    Type 2 diabetes with nephropathy ([place]) 02/29/2012   Type II diabetes mellitus (HCC)    Typical atrial flutter (HCC)    UTI (urinary tract infection) 05/08/2013    Past Surgical History:  Procedure Laterality Date   ABDOMINAL AORTAGRAM N/A 01/03/2014   Procedure: ABDOMINAL [name];  Surgeon: Wellington Hampshire, MD;  Location: [facility] CATH [facility];  Service: Cardiovascular;  Laterality: N/A;   ABDOMINAL HYSTERECTOMY  1972   "partial"   APPENDECTOMY  1970's   CARDIAC CATHETERIZATION  2008   Tiny OM-2 with 90% narrowing. Med tx.   CARDIAC CATHETERIZATION N/A 11/30/2014   Procedure: Left Heart Cath and Coronary Angiography;  Surgeon: Troy Sine, MD; LAD 20%, CFX 50%, OM1 95%, right PLB 30%, LV normal    CARDIAC CATHETERIZATION N/A 11/30/2014   Procedure: Coronary Balloon Angioplasty;  Surgeon: Troy Sine, MD;  Angiosculpt scoring balloon and PTCA to the OM1 reducing stenosis from 95% to less than 10%   CARDIAC CATHETERIZATION N/A 04/03/2015   Procedure: Left Heart Cath and Coronary Angiography;  Surgeon: Jolaine Artist, MD; dLAD 50%, CFX 90%, OM1 100%, PLA 15%, LVEDP 13     CARDIAC CATHETERIZATION N/A 04/03/2015   Procedure: Coronary Stent Intervention;  Surgeon: Sherren Mocha, MD; 3.0x18 mm Xience DES to the CFX     CARDIAC CATHETERIZATION N/A 08/02/2015   Procedure: Left Heart  Cath and Coronary Angiography;  Surgeon: Troy Sine, MD;  Location: [facility] CV LAB;  Service: Cardiovascular;  Laterality: N/A;   CARDIAC CATHETERIZATION N/A 08/02/2015   Procedure: Coronary Stent Intervention;  Surgeon: Troy Sine, MD;  Location: [facility] CV LAB;  Service: Cardiovascular;  Laterality: N/A;   CARDIAC CATHETERIZATION N/A 06/25/2016   Procedure: Pericardiocentesis;  Surgeon: Will Meredith Leeds, MD;  Location: [facility] CV LAB;  Service: Cardiovascular;  Laterality: N/A;   cardiac stents     CARDIOVERSION N/A 12/15/2017   Procedure: CARDIOVERSION;  Surgeon: Acie Fredrickson Wonda Cheng, MD;  Location: [facility];  Service: Cardiovascular;  Laterality: N/A;   CHOLECYSTECTOMY OPEN  1990's   COLONOSCOPY  2005   Dr. Laural Golden: pancolonic divericula, polyp, path unknown currently   COLONOSCOPY  2012   Dr. Oneida Alar: Normal TI, scattered diverticula in entire colon, small internal hemorrhoids, normal colon biopsies. Colonoscopy in 5-10 years.    COLOSTOMY  05/1979   COLOSTOMY REVERSAL  11/1979   EP IMPLANTABLE DEVICE  N/A 06/25/2016   Procedure: Lead Revision/Repair;  Surgeon: Will Meredith Leeds, MD;  Location: [facility] CV LAB;  Service: Cardiovascular;  Laterality: N/A;   EP IMPLANTABLE DEVICE N/A 06/25/2016   Procedure: Pacemaker Implant;  Surgeon: Will Meredith Leeds, MD;  Location: [facility] CV LAB;  Service: Cardiovascular;  Laterality: N/A;   EXCISIONAL HEMORRHOIDECTOMY  1970's   EYE SURGERY Left 2000   "branch vein occlusion"   EYE SURGERY Left ~ 2001   "smoothed out wrinkle"   LEFT OOPHORECTOMY  05/1979   nicked bowel, peritonitis, colostomy; colostomy reversed 1981    LOWER EXTREMITY ANGIOGRAM N/A 01/03/2014   Procedure: LOWER EXTREMITY ANGIOGRAM;  Surgeon: Wellington Hampshire, MD;  Location: [facility] CATH LAB;  Service: Cardiovascular;  Laterality: N/A;   Nuclear med stress test  10/2011   Small area of mild ischemia inferoapically.   PARTIAL HYSTERECTOMY  1970's   left ovaries,  then ovaries removed later due tumors    RIGHT OOPHORECTOMY  1970's    Current Medications: Outpatient Medications Prior to Visit  Medication Sig Dispense Refill   acetaminophen (TYLENOL) 325 MG tablet Take 650 mg by mouth every 6 (six) hours as needed for headache.     amiodarone (PACERONE) 200 MG tablet Take 0.5 tablets (100 mg total) by mouth daily. 45 tablet 1   apixaban (ELIQUIS) 5 MG TABS tablet Take 1 tablet (5 mg total) by mouth 2 (two) times daily. 28 tablet 0   atorvastatin (LIPITOR) 80 MG tablet TAKE ONE TABLET BY MOUTH EVERY DAY AT 6:00PM 90 tablet 3   carvedilol (COREG) 12.5 MG tablet Take 1 tablet (12.5 mg total) by mouth 2 (two) times daily. (Patient taking differently: Take 12.5 mg by mouth 2 (two) times daily. 1/2 tablet 2 (two) times daily) 180 tablet 3   Cholecalciferol (VITAMIN D3 PO) Take 1 tablet by mouth daily.      clopidogrel (PLAVIX) 75 MG tablet TAKE ONE TABLET (75MG  TOTAL) BY MOUTH DAILY 90 tablet 3   Continuous Blood Gluc Receiver (FREESTYLE LIBRE 2 READER) DEVI 1 each by Does not apply route daily. 1 each 0   Continuous Blood Gluc Sensor (FREESTYLE LIBRE 2 SENSOR) MISC 1 each by Does not apply route every 14 (fourteen) days. 6 each 3   cyanocobalamin (,VITAMIN B-12,) 1000 MCG/ML injection Inject 1,000 mcg into the muscle every 30 (thirty) days. Last injection was around 08/02/19     diltiazem (CARDIZEM CD) 360 MG 24 hr capsule Take 1 capsule (360 mg total) by mouth daily. 90 capsule 3   Dulaglutide (TRULICITY) 3 UJ/8.1XB SOPN Inject 3 mg into the skin once a week. 6 mL 3   FREESTYLE LITE test strip 1 each by Other route 2 (two) times daily.      furosemide (LASIX) 40 MG tablet TAKE ONE AND HALF TABLET (60MG  TOTAL) BY MOUTH IN THE MORNING AND ONE TABLET IN THE EVENING (40MG  TOTAL) 180 tablet 4   hydrALAZINE (APRESOLINE) 50 MG tablet Take 1 tablet (50 mg total) by mouth 3 (three) times daily. (Patient taking differently: Take 50 mg by mouth 2 (two) times daily.) 270  tablet 3   insulin NPH-regular Human (70-30) 100 UNIT/ML injection Inject 20-30 units 2x a day before meals 45 mL 3   levothyroxine (SYNTHROID) 125 MCG tablet Take 1 tablet (125 mcg total) by mouth daily. 90 tablet 3   NEOMYCIN-POLYMYXIN-HYDROCORTISONE (CORTISPORIN) 1 % SOLN OTIC solution Apply 1-2 drops to nail once daily after shower 10 mL 0   nitroGLYCERIN (  NITROSTAT) 0.4 MG SL tablet Place 1 tablet (0.4 mg total) under the tongue every 5 (five) minutes as needed for chest pain. 25 tablet 0   olmesartan (BENICAR) 40 MG tablet Take 40 mg by mouth daily.     potassium chloride SA (KLOR-CON) 20 MEQ tablet TAKE ONE (1) TABLET BY MOUTH EVERY DAY 90 tablet 1   rOPINIRole (REQUIP) 0.5 MG tablet Take 0.5 mg by mouth at bedtime.   0   ciprofloxacin (CIPRO) 500 MG tablet Take ONE tablet by MOUTH in THE morning AND ONE tablet in THE evening. DO ALL this FOR 10 DAYS. (Patient not taking: Reported on 02/18/2021)     diltiazem (TIAZAC) 360 MG 24 hr capsule TAKE ONE CAPSULE BY MOUTH EVERY MORNING WITH BREAKFAST     No facility-administered medications prior to visit.     Allergies:   Penicillins and Percocet [oxycodone-acetaminophen]   Social History   Socioeconomic History   Marital status: Married    Spouse name: Not on file   Number of children: Not on file   Years of education: Not on file   Highest education level: Not on file  Occupational History   Occupation: Retired    Fish farm manager: RETIRED    Comment: insurance billing  Tobacco Use   Smoking status: Never   Smokeless tobacco: Never   Tobacco comments:    Never smoked  Vaping Use   Vaping Use: Never used  Substance and Sexual Activity   Alcohol use: No    Alcohol/week: 0.0 standard drinks   Drug use: No   Sexual activity: Never    Birth control/protection: Surgical    Comment: hyst  Other Topics Concern   Not on file  Social History Narrative   Not on file   Social Determinants of Health   Financial Resource Strain: Not on  file  Food Insecurity: Not on file  Transportation Needs: Not on file  Physical Activity: Not on file  Stress: Not on file  Social Connections: Not on file     Family History:  The patient's family history includes Diabetes in her brother; Heart disease in her brother, brother, father, mother, and sister; Lupus in her daughter; Thyroid disease in her brother and brother.   Review of Systems:    Please see the history of present illness.     All other systems reviewed and are otherwise negative except as noted above.   Physical Exam:    VS:  BP (!) 132/56   Pulse (!) 58   Ht 5\' 3"  (1.6 m)   Wt 153 lb 3.2 oz (69.5 kg)   SpO2 99%   BMI 27.14 kg/m    General: Well developed, well nourished,female appearing in no acute distress. Head: Normocephalic, atraumatic. Neck: No carotid bruits. JVD not elevated.  Lungs: Respirations regular and unlabored, without wheezes or rales.  Heart: Regular rate and rhythm. No S3 or S4.  No murmur, no rubs, or gallops appreciated. Abdomen: Appears non-distended. No obvious abdominal masses. Msk:  Strength and tone appear normal for age. No obvious joint deformities or effusions. Extremities: No clubbing or cyanosis. No pitting edema.  Distal pedal pulses are 2+ bilaterally. Neuro: Alert and oriented X 3. Moves all extremities spontaneously. No focal deficits noted. Psych:  Responds to questions appropriately with a normal affect. Skin: No rashes or lesions noted  Wt Readings from Last 3 Encounters:  02/18/21 153 lb 3.2 oz (69.5 kg)  02/04/21 150 lb 12.8 oz (68.4 kg)  10/22/20 154  lb 6.4 oz (70 kg)     Studies/Labs Reviewed:   EKG:  EKG is not ordered today.    Recent Labs: 08/22/2020: ALT 13; BUN 30; Creatinine, Ser 1.79; Hemoglobin 12.4; Platelets 265; Potassium 4.0; Sodium 140 02/04/2021: TSH 0.40   Lipid Panel    Component Value Date/Time   CHOL 166 08/05/2017 0807   TRIG 160 (H) 08/05/2017 0807   HDL 43 (L) 08/05/2017 0807   CHOLHDL  3.9 08/05/2017 0807   VLDL NOT CALC 04/14/2016 0910   LDLCALC 96 08/05/2017 0807    Additional studies/ records that were reviewed today include:   NST: 01/2018 Downsloping ST segment depression ST segment depression of 0.5 mm was noted during stress in the II, III, aVF, V5 and V6 leads. Defect 1: There is a small defect of mild severity present in the apical anterior and apical septal location. This likely represents soft tissue attenuation. A small degree of scar cannot entirely be excluded. No signficant ischemia. This is a low risk study. Nuclear stress EF: 64%.   Echocardiogram: 08/2019 IMPRESSIONS     1. Left ventricular ejection fraction, by estimation, is 55 to 60%. The  left ventricle has normal function. The left ventricle has no regional  wall motion abnormalities. There is moderate concentric left ventricular  hypertrophy. Left ventricular  diastolic function could not be evaluated. Left ventricular diastolic  function could not be evaluated. Elevated left ventricular end-diastolic  pressure.   2. Right ventricular systolic function is normal. The right ventricular  size is normal. There is mildly elevated pulmonary artery systolic  pressure.   3. Left atrial size was severely dilated.   4. The mitral valve is normal in structure and function. Trivial mitral  valve regurgitation. No evidence of mitral stenosis.   5. The aortic valve is tricuspid. Aortic valve regurgitation is not  visualized. No aortic stenosis is present.   6. The inferior vena cava is normal in size with <50% respiratory  variability, suggesting right atrial pressure of 8 mmHg.   Assessment:    1. Coronary artery disease involving native coronary artery of native heart without angina pectoris   2. Chronic diastolic heart failure ([place])   3. PAF (paroxysmal atrial fibrillation) ([place])   4. Tachy-brady syndrome ([place])   5. Essential hypertension   6. Stage 3b chronic kidney disease ([place])       Plan:   In order of problems listed above:  1. CAD - She is s/p PTCA of OM1 in 2016, DES to LCx in 2016 and DES to RCA in 2017 with her most recent ischemic evaluation being a low-risk NST in 01/2018. While she denies any recent exertional chest pain, she does report baseline dyspnea on exertion and fatigue. She does report shortness of breath was one of her prior anginal symptoms in the past. We reviewed possibly obtaining a repeat Lexiscan Myoview for ischemic evaluation but she wishes to hold off on this for now given her overall stable symptoms over the past year. I encouraged her to reach out if symptoms progress or she wishes to pursue this prior to her next visit. - Continue current regimen with Atorvastatin 80 mg daily, Coreg 12.5 mg twice daily and Plavix 75 mg daily.  2. HFpEF - Prior echocardiogram in 2021 showed a preserved EF of 55 to 60% with no regional wall motion abnormalities. Euvolemic by examination today. She remains on Lasix 60mg  in AM/40mg  in PM. She is followed by Dr. Theador Hawthorne given her underlying CKD  and creatinine was stable at 1.65 in 12/2020.  3. Paroxysmal Atrial Fibrillation - She experiences brief palpitations but denies any symptoms resembling her prior atrial fibrillation with RVR. She remains on Amiodarone 100 mg daily for rhythm control along with Coreg 12.5 mg twice daily and Cardizem CD 360 mg daily for rate control. Coreg was previously reduced to see if this would help with fatigue but she did not notice a change in symptoms. Given her overall well-controlled blood pressure today, I did not further reduce dosing at this time. - She remains on Eliquis for anticoagulation and denies any evidence of active bleeding.  Hemoglobin was stable at 13.0 and platelets were at 295 when checked in 12/2020.    4. Tachy-brady syndrome - She is s/p Medtronic PPM placement in 2018 which is followed by Dr. Shonna Chock.  Her device was functioning normally by recent remote check  in 12/2020 and she has a follow-up visit scheduled with Dr. Curt Bears next month.  5. HTN - Her blood pressure is well-controlled at 132/56 during today's visit. Continue current medication regimen with Coreg, Cardizem CD, Hydralazine and Olmesartan at current dosing.  6. Stage 3 CKD - Her creatinine was stable at 1.65 by recent labs in 12/2020. Followed by Dr. Theador Hawthorne.    Medication Adjustments/Labs and Tests Ordered: Current medicines are reviewed at length with the patient today.  Concerns regarding medicines are outlined above.  Medication changes, Labs and Tests ordered today are listed in the Patient Instructions below. Patient Instructions  Medication Instructions:  Your physician recommends that you continue on your current medications as directed. Please refer to the Current Medication list given to you today.  *If you need a refill on your cardiac medications before your next appointment, please call your pharmacy*   Lab Work: None   If you have labs (blood work) drawn today and your tests are completely normal, you will receive your results only by: [place] (if you have MyChart) OR A paper copy in the mail If you have any lab test that is abnormal or we need to change your treatment, we will call you to review the results.   Testing/Procedures: NONE    Follow-Up: At [facility], you and your health needs are our priority.  As part of our continuing mission to provide you with exceptional heart care, we have created designated Provider Care Teams.  These Care Teams include your primary Cardiologist (physician) and Advanced Practice Providers (APPs -  Physician Assistants and Nurse Practitioners) who all work together to provide you with the care you need, when you need it.  We recommend signing up for the patient portal called "MyChart".  Sign up information is provided on this After Visit Summary.  MyChart is used to connect with patients for Virtual Visits  (Telemedicine).  Patients are able to view lab/test results, encounter notes, upcoming appointments, etc.  Non-urgent messages can be sent to your provider as well.   To learn more about what you can do with MyChart, go to [email or web address].    Your next appointment:   6 month(s)  The format for your next appointment:   In Person  Provider:   Rozann Lesches, MD   Other Instructions Thank you for choosing [facility]!     Signed, Erma Heritage, PA-C  02/18/2021 4:29 PM    [facility] [address] [place], [place] [address] Phone: [phone] Fax: [phone]

## 2021-02-18 ENCOUNTER — Ambulatory Visit (INDEPENDENT_AMBULATORY_CARE_PROVIDER_SITE_OTHER): Payer: HMO | Admitting: Student

## 2021-02-18 ENCOUNTER — Other Ambulatory Visit: Payer: Self-pay

## 2021-02-18 ENCOUNTER — Encounter: Payer: Self-pay | Admitting: Student

## 2021-02-18 VITALS — BP 132/56 | HR 58 | Ht 63.0 in | Wt 153.2 lb

## 2021-02-18 DIAGNOSIS — I5032 Chronic diastolic (congestive) heart failure: Secondary | ICD-10-CM

## 2021-02-18 DIAGNOSIS — I480 Paroxysmal atrial fibrillation: Secondary | ICD-10-CM | POA: Diagnosis not present

## 2021-02-18 DIAGNOSIS — N1832 Chronic kidney disease, stage 3b: Secondary | ICD-10-CM | POA: Diagnosis not present

## 2021-02-18 DIAGNOSIS — I2510 Atherosclerotic heart disease of native coronary artery without angina pectoris: Secondary | ICD-10-CM | POA: Diagnosis not present

## 2021-02-18 DIAGNOSIS — I495 Sick sinus syndrome: Secondary | ICD-10-CM | POA: Diagnosis not present

## 2021-02-18 DIAGNOSIS — I10 Essential (primary) hypertension: Secondary | ICD-10-CM | POA: Diagnosis not present

## 2021-02-18 NOTE — Patient Instructions (Signed)
Medication Instructions:  Your physician recommends that you continue on your current medications as directed. Please refer to the Current Medication list given to you today.  *If you need a refill on your cardiac medications before your next appointment, please call your pharmacy*   Lab Work: None   If you have labs (blood work) drawn today and your tests are completely normal, you will receive your results only by: [place] (if you have MyChart) OR A paper copy in the mail If you have any lab test that is abnormal or we need to change your treatment, we will call you to review the results.   Testing/Procedures: NONE    Follow-Up: At [facility], you and your health needs are our priority.  As part of our continuing mission to provide you with exceptional heart care, we have created designated Provider Care Teams.  These Care Teams include your primary Cardiologist (physician) and Advanced Practice Providers (APPs -  Physician Assistants and Nurse Practitioners) who all work together to provide you with the care you need, when you need it.  We recommend signing up for the patient portal called "MyChart".  Sign up information is provided on this After Visit Summary.  MyChart is used to connect with patients for Virtual Visits (Telemedicine).  Patients are able to view lab/test results, encounter notes, upcoming appointments, etc.  Non-urgent messages can be sent to your provider as well.   To learn more about what you can do with MyChart, go to [email or web address].    Your next appointment:   6 month(s)  The format for your next appointment:   In Person  Provider:   Rozann Lesches, MD   Other Instructions Thank you for choosing [facility]!

## 2021-02-25 ENCOUNTER — Ambulatory Visit (INDEPENDENT_AMBULATORY_CARE_PROVIDER_SITE_OTHER): Payer: HMO | Admitting: Cardiology

## 2021-02-25 ENCOUNTER — Other Ambulatory Visit: Payer: Self-pay

## 2021-02-25 ENCOUNTER — Encounter: Payer: Self-pay | Admitting: Cardiology

## 2021-02-25 VITALS — BP 120/64 | HR 63 | Ht 63.0 in | Wt 150.8 lb

## 2021-02-25 DIAGNOSIS — I480 Paroxysmal atrial fibrillation: Secondary | ICD-10-CM

## 2021-02-25 DIAGNOSIS — Z79899 Other long term (current) drug therapy: Secondary | ICD-10-CM | POA: Diagnosis not present

## 2021-02-25 NOTE — Progress Notes (Signed)
Electrophysiology Office Note   Date:  02/25/2021   ID:  Virginia, Huber [birth year], MRN [id number]  PCP:  Sharilyn Sites, MD  Cardiologist:  Bronson Ing Primary Electrophysiologist:  Karletta Millay Meredith Leeds, MD    No chief complaint on file.    History of Present Illness: Virginia Huber is a 75 y.o. female who presents today for electrophysiology evaluation.     She has a history significant for coronary artery disease status post PCI to the OM1 with STEMI in 2016, PCI to the circumflex, non-STEMI in 2017 with PCI to the RCA.  She has paroxysmal atrial fibrillation during that admission.  She is currently on Eliquis.  Sleep study showed mild sleep apnea.  She was hospitalized December 2017 with atrial fibrillation with rapid rates.  She was cardioverted rapid atrial fibrillation and junctional rhythm.  She she is status post Medtronic pacemaker due to pericardial  Today, denies symptoms of palpitations, chest pain, shortness of breath, orthopnea, PND, lower extremity edema, claudication, dizziness, presyncope, syncope, bleeding, or neurologic sequela. The patient is tolerating medications without difficulties.  Done well.  She has had no chest pain or shortness of breath.  She has had minimal palpitations.  She does get intermittent episodes of dizziness.  Dizziness occurs most recently when she was standing at her sink.  She had not recently changed positions.  She understand this is less likely to be cardiac in nature.  Device interrogation is without abnormality to explain dizziness.  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 h/o PAF/flutter during admission in 2013 for PNA. b. PAF during adm for NSTEMI 07/2015, subsequent paroxysms since then.   Atrial fibrillation with rapid ventricular response (HCC)    B12 deficiency anemia    Bradycardia 06/14/2016   Branch retinal vein occlusion with macular edema of right eye 10/11/2019   Bronchospasm 03/02/2012   CAD in native artery 11/30/2014   Chronic renal failure, stage 3b ([place]) 09/23/2017   Coronary artery disease 11/30/2014   a. remote MI. b. h/o PTCA with scoring balloon to OM1 11/2014. c. NSTEMI 03/2015 s/p DES to prox-mid Cx. d. 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 s/p LE angio 2015; followed by Dr. Fletcher Anon - managed medically.   Pain with urination 05/08/2015   Paroxysmal atrial fibrillation ([place]) 12/08/2017   Pericardial effusion    a. 06/2016 after ppm - s/p pericardiocentesis.   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 s/p Medtronic PPM 06/2016, c/b lead perf/pericardial effusion.   Tamponade    TIA (transient ischemic attack)    Type 2 diabetes with nephropathy ([place]) 02/29/2012   Type II diabetes mellitus (HCC)    Typical atrial flutter (HCC)    UTI (urinary tract infection) 05/08/2013   Past Surgical History:  Procedure Laterality Date   ABDOMINAL AORTAGRAM N/A 01/03/2014   Procedure: ABDOMINAL [name];  Surgeon: Wellington Hampshire, MD;  Location: [facility];  Service: Cardiovascular;  Laterality: N/A;   ABDOMINAL HYSTERECTOMY  1972   "partial"   APPENDECTOMY  1970's   CARDIAC CATHETERIZATION  2008   Tiny OM-2 with 90% narrowing. Med tx.   CARDIAC CATHETERIZATION N/A 11/30/2014   Procedure: Left Heart Cath and Coronary Angiography;  Surgeon: Troy Sine, MD; LAD 20%, CFX 50%, OM1 95%, right PLB 30%, LV normal    CARDIAC CATHETERIZATION N/A 11/30/2014   Procedure: Coronary Balloon Angioplasty;  Surgeon: Troy Sine, MD;  Angiosculpt scoring balloon and PTCA to the OM1 reducing stenosis from 95% to less than 10%   CARDIAC CATHETERIZATION N/A 04/03/2015   Procedure: Left Heart Cath and Coronary Angiography;  Surgeon: Jolaine Artist, MD; dLAD 50%, CFX 90%, OM1 100%, PLA 15%, LVEDP 13     CARDIAC CATHETERIZATION N/A 04/03/2015   Procedure: Coronary Stent Intervention;  Surgeon: Sherren Mocha, MD; 3.0x18 mm Xience DES to the CFX     CARDIAC CATHETERIZATION N/A 08/02/2015   Procedure: Left Heart Cath and Coronary Angiography;  Surgeon: Troy Sine, MD;  Location: [facility];  Service:  Cardiovascular;  Laterality: N/A;   CARDIAC CATHETERIZATION N/A 08/02/2015   Procedure: Coronary Stent Intervention;  Surgeon: Troy Sine, MD;  Location: [facility];  Service: Cardiovascular;  Laterality: N/A;   CARDIAC CATHETERIZATION N/A 06/25/2016   Procedure: Pericardiocentesis;  Surgeon: Shaheer Bonfield Meredith Leeds, MD;  Location: [facility];  Service: Cardiovascular;  Laterality: N/A;   cardiac stents     CARDIOVERSION N/A 12/15/2017   Procedure: CARDIOVERSION;  Surgeon: Acie Fredrickson Wonda Cheng, MD;  Location: [facility] ENDOSCOPY;  Service: Cardiovascular;  Laterality: N/A;   CHOLECYSTECTOMY OPEN  1990's   COLONOSCOPY  2005   Dr. Laural Golden: pancolonic divericula, polyp, path unknown currently   COLONOSCOPY  2012   Dr. Oneida Alar: Normal TI, scattered diverticula in entire colon, small internal hemorrhoids, normal colon biopsies. Colonoscopy in 5-10 years.    COLOSTOMY  05/1979   COLOSTOMY REVERSAL  11/1979   EP IMPLANTABLE DEVICE N/A 06/25/2016   Procedure: Lead Revision/Repair;  Surgeon: Arloa Prak Meredith Leeds, MD;  Location: [facility];  Service: Cardiovascular;  Laterality: N/A;   EP IMPLANTABLE DEVICE N/A 06/25/2016   Procedure: Pacemaker Implant;  Surgeon: Hanna Ra Hassell Done  Preslyn Warr, MD;  Location: [facility];  Service: Cardiovascular;  Laterality: N/A;   EXCISIONAL HEMORRHOIDECTOMY  1970's   EYE SURGERY Left 2000   "branch vein occlusion"   EYE SURGERY Left ~ 2001   "smoothed out wrinkle"   LEFT OOPHORECTOMY  05/1979   nicked bowel, peritonitis, colostomy; colostomy reversed 1981    LOWER EXTREMITY ANGIOGRAM N/A 01/03/2014   Procedure: LOWER EXTREMITY ANGIOGRAM;  Surgeon: Wellington Hampshire, MD;  Location: [facility] CATH LAB;  Service: Cardiovascular;  Laterality: N/A;   Nuclear med stress test  10/2011   Small area of mild ischemia inferoapically.   PARTIAL HYSTERECTOMY  1970's   left ovaries, then ovaries removed later due tumors    RIGHT OOPHORECTOMY  1970's     Current Outpatient  Medications  Medication Sig Dispense Refill   acetaminophen (TYLENOL) 325 MG tablet Take 650 mg by mouth every 6 (six) hours as needed for headache.     amiodarone (PACERONE) 200 MG tablet Take 0.5 tablets (100 mg total) by mouth daily. 45 tablet 1   apixaban (ELIQUIS) 5 MG TABS tablet Take 1 tablet (5 mg total) by mouth 2 (two) times daily. 28 tablet 0   atorvastatin (LIPITOR) 80 MG tablet TAKE ONE TABLET BY MOUTH EVERY DAY AT 6:00PM 90 tablet 3   carvedilol (COREG) 12.5 MG tablet Take 1 tablet (12.5 mg total) by mouth 2 (two) times daily. (Patient taking differently: Take 12.5 mg by mouth 2 (two) times daily. 1/2 tablet 2 (two) times daily) 180 tablet 3   Cholecalciferol (VITAMIN D3 PO) Take 1 tablet by mouth daily.      clopidogrel (PLAVIX) 75 MG tablet TAKE ONE TABLET (75MG  TOTAL) BY MOUTH DAILY 90 tablet 3   Continuous Blood Gluc Receiver (FREESTYLE LIBRE 2 READER) DEVI 1 each by Does not apply route daily. 1 each 0   Continuous Blood Gluc Sensor (FREESTYLE LIBRE 2 SENSOR) MISC 1 each by Does not apply route every 14 (fourteen) days. 6 each 3   cyanocobalamin (,VITAMIN B-12,) 1000 MCG/ML injection Inject 1,000 mcg into the muscle every 30 (thirty) days. Last injection was around 08/02/19     diltiazem (CARDIZEM CD) 360 MG 24 hr capsule Take 1 capsule (360 mg total) by mouth daily. 90 capsule 3   Dulaglutide (TRULICITY) 3 GG/2.6RS SOPN Inject 3 mg into the skin once a week. 6 mL 3   FREESTYLE LITE test strip 1 each by Other route 2 (two) times daily.      furosemide (LASIX) 40 MG tablet TAKE ONE AND HALF TABLET (60MG  TOTAL) BY MOUTH IN THE MORNING AND ONE TABLET IN THE EVENING (40MG  TOTAL) 180 tablet 4   hydrALAZINE (APRESOLINE) 50 MG tablet Take 1 tablet (50 mg total) by mouth 3 (three) times daily. (Patient taking differently: Take 50 mg by mouth 2 (two) times daily.) 270 tablet 3   insulin NPH-regular Human (70-30) 100 UNIT/ML injection Inject 20-30 units 2x a day before meals 45 mL 3    levothyroxine (SYNTHROID) 125 MCG tablet Take 1 tablet (125 mcg total) by mouth daily. 90 tablet 3   NEOMYCIN-POLYMYXIN-HYDROCORTISONE (CORTISPORIN) 1 % SOLN OTIC solution Apply 1-2 drops to nail once daily after shower 10 mL 0   nitroGLYCERIN (NITROSTAT) 0.4 MG SL tablet Place 1 tablet (0.4 mg total) under the tongue every 5 (five) minutes as needed for chest pain. 25 tablet 0   olmesartan (BENICAR) 40 MG tablet Take 40 mg by mouth daily.     potassium  chloride SA (KLOR-CON) 20 MEQ tablet TAKE ONE (1) TABLET BY MOUTH EVERY DAY 90 tablet 1   rOPINIRole (REQUIP) 0.5 MG tablet Take 0.5 mg by mouth at bedtime.   0   No current facility-administered medications for this visit.    Allergies:   Penicillins and Percocet [oxycodone-acetaminophen]   Social History:  The patient  reports that she has never smoked. She has never used smokeless tobacco. She reports that she does not drink alcohol and does not use drugs.   Family History:  The patient's family history includes Diabetes in her brother; Heart disease in her brother, brother, father, mother, and sister; Lupus in her daughter; Thyroid disease in her brother and brother.   ROS:  Please see the history of present illness.   Otherwise, review of systems is positive for none.   All other systems are reviewed and negative.   PHYSICAL EXAM: VS:  BP 120/64   Pulse 63   Ht 5\' 3"  (1.6 m)   Wt 150 lb 12.8 oz (68.4 kg)   SpO2 98%   BMI 26.71 kg/m  , BMI Body mass index is 26.71 kg/m. GEN: Well nourished, well developed, in no acute distress  HEENT: normal  Neck: no JVD, carotid bruits, or masses Cardiac: RRR; no murmurs, rubs, or gallops,no edema  Respiratory:  clear to auscultation bilaterally, normal work of breathing GI: soft, nontender, nondistended, + BS MS: no deformity or atrophy  Skin: warm and dry, device site well healed Neuro:  Strength and sensation are intact Psych: euthymic mood, full affect  EKG:  EKG is ordered  today. Personal review of the ekg ordered shows atrial paced, rate 63  Personal review of the device interrogation today. Results in [place]: 08/22/2020: ALT 13; BUN 30; Creatinine, Ser 1.79; Hemoglobin 12.4; Platelets 265; Potassium 4.0; Sodium 140 02/04/2021: TSH 0.40    Lipid Panel     Component Value Date/Time   CHOL 166 08/05/2017 0807   TRIG 160 (H) 08/05/2017 0807   HDL 43 (L) 08/05/2017 0807   CHOLHDL 3.9 08/05/2017 0807   VLDL NOT CALC 04/14/2016 0910   LDLCALC 96 08/05/2017 0807     Wt Readings from Last 3 Encounters:  02/25/21 150 lb 12.8 oz (68.4 kg)  02/18/21 153 lb 3.2 oz (69.5 kg)  02/04/21 150 lb 12.8 oz (68.4 kg)      Other studies Reviewed: Additional studies/ records that were reviewed today include: TTE 08/04/17 Review of the above records today demonstrates:  - Left ventricle: The cavity size was normal. Wall thickness was   increased in a pattern of moderate LVH. Systolic function was   normal. The estimated ejection fraction was in the range of 55%   to 60%. Wall motion was normal; there were no regional wall   motion abnormalities. Features are consistent with a pseudonormal   left ventricular filling pattern, with concomitant abnormal   relaxation and increased filling pressure (grade 2 diastolic   dysfunction). Doppler parameters are consistent with high   ventricular filling pressure. - Left atrium: The atrium was severely dilated. - Right ventricle: Pacer wire or catheter noted in right ventricle. - Right atrium: Pacer wire or catheter noted in right atrium.  Cath  08/02/15 1st RPLB lesion, 50% stenosed. Dist RCA lesion, 30% stenosed. Ost 1st Mrg to 1st Mrg lesion, 100% stenosed. Ost LAD lesion, 40% stenosed. Mid LAD lesion, 40% stenosed. Post Atrio lesion, 90% stenosed. Post intervention, there is a 0% residual stenosis. The  left ventricular systolic function is normal. Mid RCA lesion, 20% stenosed.   Normal LV function  without focal segmental wall motion abnormalities and ejection fraction of 55%.   Successful percutaneous cardiac intervention to the distal RCA treated with Angiosculpt scoring balloon, PTCA, and ultimate stenting with a 2.58 mm Xience Alpine DES stent postdilated to 2.51 mm with a 90% stenosis reduced to 0% and no change in the ostial PDA narrowing.  ASSESSMENT AND PLAN:  1.  Paroxysmal atrial fibrillation: Status post Medtronic dual-chamber pacemaker.  Device functioning appropriately.  Currently on amiodarone 100 mg daily, Eliquis 5 mg twice daily.  CHA2DS2-VASc of 7.  She has had no further episodes of atrial fibrillation.  No changes at this time.  Hepatic panel and ECG to measure high risk medication, amiodarone  2.  Coronary artery disease: No current chest pain  3.  Hypertension: Currently well controlled.  Continue carvedilol 12.5 mg, Lasix 40 mg, hydralazine 50 mg 3 times daily, Benicar 40 mg daily.   Current medicines are reviewed at length with the patient today.   The patient does not have concerns regarding her medicines.  None  Labs/ tests ordered today include:  Orders Placed This Encounter  Procedures   Hepatic function panel   EKG 12-Lead      Disposition:   FU with Lucia Mccreadie 6 months  Signed, Heinrich Fertig Meredith Leeds, MD  02/25/2021 4:24 PM     [address] [place] [phone] (office) [phone] (fax)

## 2021-02-25 NOTE — Patient Instructions (Signed)
Medication Instructions:  Your physician recommends that you continue on your current medications as directed. Please refer to the Current Medication list given to you today.  *If you need a refill on your cardiac medications before your next appointment, please call your pharmacy*   Lab Work: Today: LFTs If you have labs (blood work) drawn today and your tests are completely normal, you will receive your results only by: [place] (if you have MyChart) OR A paper copy in the mail If you have any lab test that is abnormal or we need to change your treatment, we will call you to review the results.   Testing/Procedures: None ordered   Follow-Up: At [facility], you and your health needs are our priority.  As part of our continuing mission to provide you with exceptional heart care, we have created designated Provider Care Teams.  These Care Teams include your primary Cardiologist (physician) and Advanced Practice Providers (APPs -  Physician Assistants and Nurse Practitioners) who all work together to provide you with the care you need, when you need it.  Remote monitoring is used to monitor your Pacemaker or ICD from home. This monitoring reduces the number of office visits required to check your device to one time per year. It allows [place] to keep an eye on the functioning of your device to ensure it is working properly. You are scheduled for a device check from home on 04/09/2021. You may send your transmission at any time that day. If you have a wireless device, the transmission will be sent automatically. After your physician reviews your transmission, you will receive a postcard with your next transmission date.  Your next appointment:   5 month(s)  The format for your next appointment:   In Person  Provider:   You will see one of the following Advanced Practice Providers on your designated Care Team:   Tommye Standard, [place] Legrand Como "Jonni Sanger" Chalmers Cater, [place]   Thank you for  choosing [facility]!!   Trinidad Curet, RN [phone]

## 2021-02-26 LAB — HEPATIC FUNCTION PANEL
ALT: 15 IU/L (ref 0–32)
AST: 6 IU/L (ref 0–40)
Albumin: 4.4 g/dL (ref 3.7–4.7)
Alkaline Phosphatase: 118 IU/L (ref 44–121)
Bilirubin Total: 0.3 mg/dL (ref 0.0–1.2)
Bilirubin, Direct: 0.13 mg/dL (ref 0.00–0.40)
Total Protein: 6.4 g/dL (ref 6.0–8.5)

## 2021-02-27 ENCOUNTER — Encounter (INDEPENDENT_AMBULATORY_CARE_PROVIDER_SITE_OTHER): Payer: Self-pay | Admitting: Ophthalmology

## 2021-02-27 ENCOUNTER — Ambulatory Visit (INDEPENDENT_AMBULATORY_CARE_PROVIDER_SITE_OTHER): Payer: HMO | Admitting: Ophthalmology

## 2021-02-27 ENCOUNTER — Other Ambulatory Visit: Payer: Self-pay

## 2021-02-27 DIAGNOSIS — H34831 Tributary (branch) retinal vein occlusion, right eye, with macular edema: Secondary | ICD-10-CM | POA: Diagnosis not present

## 2021-02-27 DIAGNOSIS — H43311 Vitreous membranes and strands, right eye: Secondary | ICD-10-CM | POA: Diagnosis not present

## 2021-02-27 MED ORDER — BEVACIZUMAB 2.5 MG/0.1ML IZ SOSY
2.5000 mg | PREFILLED_SYRINGE | INTRAVITREAL | Status: AC | PRN
  Administered 2021-02-27: 2.5 mg via INTRAVITREAL

## 2021-02-27 NOTE — Progress Notes (Signed)
02/27/2021     CHIEF COMPLAINT Patient presents for  Chief Complaint  Patient presents with   Retina Follow Up    5 week FU OD OCT avastin OD Patient states her vision is a little more blurry, she has noticed reading is harder to see small print. Patient states she notices she has a lot more floaters than other days. Patient states her A1C is 7.1, it will be checked again next week. BS this morning was 91.      HISTORY OF PRESENT ILLNESS: Virginia Huber is a 75 y.o. female who presents to the clinic today for:   HPI     Retina Follow Up   Patient presents with  CRVO/BRVO.  In right eye.  This started 5 weeks ago.  Severity is mild.  Duration of 5 weeks. Additional comments: 5 week FU OD OCT avastin OD Patient states her vision is a little more blurry, she has noticed reading is harder to see small print. Patient states she notices she has a lot more floaters than other days. Patient states her A1C is 7.1, it will be checked again next week. BS this morning was 91.        Comments   5 week fu od oct avastin od. Patient states vision is stable and unchanged since last visit. Denies any new floaters or FOL. Pt states current floaters in the right eye are stable but they have been bothering her, states she would like to talk about what Dr Zadie Rhine and her talked about at the last visit regarding surgery in the right eye.       Last edited by Laurin Coder on 02/27/2021  1:52 PM.      Referring physician: Sharilyn Sites, MD [address] [place],  [place] [address]  HISTORICAL INFORMATION:   Selected notes from the MEDICAL RECORD NUMBER    Lab Results  Component Value Date   HGBA1C 7.7 (A) 02/04/2021     CURRENT MEDICATIONS: No current outpatient medications on file. (Ophthalmic Drugs)   No current facility-administered medications for this visit. (Ophthalmic Drugs)   Current Outpatient Medications (Other)  Medication Sig   acetaminophen (TYLENOL) 325 MG  tablet Take 650 mg by mouth every 6 (six) hours as needed for headache.   amiodarone (PACERONE) 200 MG tablet Take 0.5 tablets (100 mg total) by mouth daily.   apixaban (ELIQUIS) 5 MG TABS tablet Take 1 tablet (5 mg total) by mouth 2 (two) times daily.   atorvastatin (LIPITOR) 80 MG tablet TAKE ONE TABLET BY MOUTH EVERY DAY AT 6:00PM   carvedilol (COREG) 12.5 MG tablet Take 1 tablet (12.5 mg total) by mouth 2 (two) times daily. (Patient taking differently: Take 12.5 mg by mouth 2 (two) times daily. 1/2 tablet 2 (two) times daily)   Cholecalciferol (VITAMIN D3 PO) Take 1 tablet by mouth daily.    clopidogrel (PLAVIX) 75 MG tablet TAKE ONE TABLET (75MG  TOTAL) BY MOUTH DAILY   Continuous Blood Gluc Receiver (FREESTYLE LIBRE 2 READER) DEVI 1 each by Does not apply route daily.   Continuous Blood Gluc Sensor (FREESTYLE LIBRE 2 SENSOR) MISC 1 each by Does not apply route every 14 (fourteen) days.   cyanocobalamin (,VITAMIN B-12,) 1000 MCG/ML injection Inject 1,000 mcg into the muscle every 30 (thirty) days. Last injection was around 08/02/19   diltiazem (CARDIZEM CD) 360 MG 24 hr capsule Take 1 capsule (360 mg total) by mouth daily.   Dulaglutide (TRULICITY) 3 XV/4.0GQ SOPN Inject  3 mg into the skin once a week.   FREESTYLE LITE test strip 1 each by Other route 2 (two) times daily.    furosemide (LASIX) 40 MG tablet TAKE ONE AND HALF TABLET (60MG  TOTAL) BY MOUTH IN THE MORNING AND ONE TABLET IN THE EVENING (40MG  TOTAL)   hydrALAZINE (APRESOLINE) 50 MG tablet Take 1 tablet (50 mg total) by mouth 3 (three) times daily. (Patient taking differently: Take 50 mg by mouth 2 (two) times daily.)   insulin NPH-regular Human (70-30) 100 UNIT/ML injection Inject 20-30 units 2x a day before meals   levothyroxine (SYNTHROID) 125 MCG tablet Take 1 tablet (125 mcg total) by mouth daily.   NEOMYCIN-POLYMYXIN-HYDROCORTISONE (CORTISPORIN) 1 % SOLN OTIC solution Apply 1-2 drops to nail once daily after shower   nitroGLYCERIN  (NITROSTAT) 0.4 MG SL tablet Place 1 tablet (0.4 mg total) under the tongue every 5 (five) minutes as needed for chest pain.   olmesartan (BENICAR) 40 MG tablet Take 40 mg by mouth daily.   potassium chloride SA (KLOR-CON) 20 MEQ tablet TAKE ONE (1) TABLET BY MOUTH EVERY DAY   rOPINIRole (REQUIP) 0.5 MG tablet Take 0.5 mg by mouth at bedtime.    No current facility-administered medications for this visit. (Other)      REVIEW OF SYSTEMS:    ALLERGIES Allergies  Allergen Reactions   Penicillins Hives    Has patient had a PCN reaction causing immediate rash, facial/tongue/throat swelling, SOB or lightheadedness with hypotension: Yes Has patient had a PCN reaction causing severe rash involving mucus membranes or skin necrosis: No Has patient had a PCN reaction that required hospitalization No Has patient had a PCN reaction occurring within the last 10 years: No If all of the above answers are "NO", then may proceed with Cephalosporin use.   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 h/o PAF/flutter during admission in 2013 for PNA. b. PAF during adm for NSTEMI 07/2015, subsequent paroxysms since then.   Atrial fibrillation with rapid ventricular response (HCC)    B12 deficiency anemia    Bradycardia 06/14/2016   Branch retinal vein occlusion with macular edema of right eye 10/11/2019   Bronchospasm 03/02/2012   CAD in native artery 11/30/2014    Chronic renal failure, stage 3b ([place]) 09/23/2017   Coronary artery disease 11/30/2014   a. remote MI. b. h/o PTCA with scoring balloon to OM1 11/2014. c. NSTEMI 03/2015 s/p DES to prox-mid Cx. d. 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 s/p LE angio 2015; followed by Dr. Fletcher Anon - managed medically.   Pain with urination 05/08/2015   Paroxysmal atrial fibrillation ([place]) 12/08/2017   Pericardial effusion    a. 06/2016 after ppm - s/p pericardiocentesis.   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 s/p Medtronic PPM 06/2016, c/b lead perf/pericardial effusion.   Tamponade     TIA (transient ischemic attack)    Type 2 diabetes with nephropathy ([place]) 02/29/2012   Type II diabetes mellitus (HCC)    Typical atrial flutter (HCC)    UTI (urinary tract infection) 05/08/2013   Past Surgical History:  Procedure Laterality Date   ABDOMINAL AORTAGRAM N/A 01/03/2014   Procedure: ABDOMINAL [name];  Surgeon: Wellington Hampshire, MD;  Location: [facility];  Service: Cardiovascular;  Laterality: N/A;   ABDOMINAL HYSTERECTOMY  1972   "partial"   APPENDECTOMY  1970's   CARDIAC CATHETERIZATION  2008   Tiny OM-2 with 90% narrowing. Med tx.   CARDIAC CATHETERIZATION N/A 11/30/2014   Procedure: Left Heart Cath and Coronary Angiography;  Surgeon: Troy Sine, MD; LAD 20%, CFX 50%, OM1 95%, right PLB 30%, LV normal    CARDIAC CATHETERIZATION N/A 11/30/2014   Procedure: Coronary Balloon Angioplasty;  Surgeon: Troy Sine, MD;  Angiosculpt scoring balloon and PTCA to the OM1 reducing stenosis from 95% to less than 10%   CARDIAC CATHETERIZATION N/A 04/03/2015   Procedure: Left Heart Cath and Coronary Angiography;  Surgeon: Jolaine Artist, MD; dLAD 50%, CFX 90%, OM1 100%, PLA 15%, LVEDP 13     CARDIAC CATHETERIZATION N/A 04/03/2015   Procedure: Coronary Stent Intervention;  Surgeon: Sherren Mocha, MD; 3.0x18 mm Xience DES to the CFX     CARDIAC CATHETERIZATION N/A 08/02/2015   Procedure: Left Heart Cath and Coronary Angiography;  Surgeon: Troy Sine, MD;  Location: [facility];  Service: Cardiovascular;  Laterality: N/A;   CARDIAC CATHETERIZATION N/A 08/02/2015   Procedure: Coronary Stent Intervention;  Surgeon: Troy Sine, MD;  Location: [facility];  Service: Cardiovascular;  Laterality: N/A;   CARDIAC CATHETERIZATION N/A 06/25/2016   Procedure: Pericardiocentesis;  Surgeon: Will Meredith Leeds, MD;  Location: [facility];  Service: Cardiovascular;  Laterality: N/A;   cardiac stents     CARDIOVERSION N/A 12/15/2017   Procedure: CARDIOVERSION;  Surgeon:  Acie Fredrickson Wonda Cheng, MD;  Location: [facility] ENDOSCOPY;  Service: Cardiovascular;  Laterality: N/A;   CHOLECYSTECTOMY OPEN  1990's   COLONOSCOPY  2005   Dr. Laural Golden: pancolonic divericula, polyp, path unknown currently   COLONOSCOPY  2012   Dr. Oneida Alar: Normal TI, scattered diverticula in entire colon, small internal  hemorrhoids, normal colon biopsies. Colonoscopy in 5-10 years.    COLOSTOMY  05/1979   COLOSTOMY REVERSAL  11/1979   EP IMPLANTABLE DEVICE N/A 06/25/2016   Procedure: Lead Revision/Repair;  Surgeon: Will Meredith Leeds, MD;  Location: [facility];  Service: Cardiovascular;  Laterality: N/A;   EP IMPLANTABLE DEVICE N/A 06/25/2016   Procedure: Pacemaker Implant;  Surgeon: Will Meredith Leeds, MD;  Location: [facility];  Service: Cardiovascular;  Laterality: N/A;   EXCISIONAL HEMORRHOIDECTOMY  1970's   EYE SURGERY Left 2000   "branch vein occlusion"   EYE SURGERY Left ~ 2001   "smoothed out wrinkle"   LEFT OOPHORECTOMY  05/1979   nicked bowel, peritonitis, colostomy; colostomy reversed 1981    LOWER EXTREMITY ANGIOGRAM N/A 01/03/2014   Procedure: LOWER EXTREMITY ANGIOGRAM;  Surgeon: Wellington Hampshire, MD;  Location: [facility];  Service: Cardiovascular;  Laterality: N/A;   Nuclear med stress test  10/2011   Small area of mild ischemia inferoapically.   PARTIAL HYSTERECTOMY  1970's   left ovaries, then ovaries removed later due tumors    RIGHT OOPHORECTOMY  1970's    FAMILY HISTORY Family History  Problem Relation Age of Onset   Heart disease Mother        deceased   Heart disease Father        deceased, heart disease   Diabetes Brother    Heart disease Brother    Thyroid disease Brother    Heart disease Sister    Heart disease Brother    Thyroid disease Brother    Lupus Daughter    Colon cancer Neg Hx     SOCIAL HISTORY Social History   Tobacco Use   Smoking status: Never   Smokeless tobacco: Never   Tobacco comments:    Never smoked  Vaping Use   Vaping  Use: Never used  Substance Use Topics   Alcohol use: No    Alcohol/week: 0.0 standard drinks   Drug use: No         OPHTHALMIC EXAM:  Base Eye Exam     Visual Acuity (ETDRS)       Right Left   Dist cc 20/40 CF at 3'   Dist ph cc NI NI         Tonometry (Tonopen, 1:55 PM)       Right Left   Pressure 13 11         Pupils       Pupils Dark Light APD   Right PERRL 5 3 None   Left PERRL 5 3 None         Visual Fields (Counting fingers)       Left Right     Full   Restrictions Total superior temporal, inferior temporal, inferior nasal deficiencies          Extraocular Movement       Right Left    Full Full         Neuro/Psych     Oriented x3: Yes   Mood/Affect: Normal         Dilation     Right eye: 1.0% Mydriacyl, 2.5% Phenylephrine @ 1:55 PM           Slit Lamp and Fundus Exam     External Exam       Right Left   External Normal Normal         Slit Lamp Exam       Right Left  Lids/Lashes Normal Normal   Conjunctiva/Sclera White and quiet White and quiet   Cornea Clear Clear   Anterior Chamber Deep and quiet Deep and quiet   Iris Round and reactive Round and reactive   Lens Posterior chamber intraocular lens Posterior chamber intraocular lens   Anterior Vitreous Normal Normal         Fundus Exam       Right Left   Posterior Vitreous Posterior vitreous detachment, Central vitreous floaters, membranes and strands    Disc Normal    C/D Ratio 0.2    Macula Hard drusen,    No Cystoid macular edema clinically, Microaneurysms, Retinal pigment epithelial mottling, no hemorrhage, no exudates    Vessels Old branch retinal vein occlusion    Periphery Normal,  , no retinal hole, no retinal tear             IMAGING AND PROCEDURES  Imaging and Procedures for 02/27/21  OCT, Retina - OU - Both Eyes       Right Eye Quality was good. Scan locations included subfoveal. Central Foveal Thickness: 223. Progression has  worsened. Findings include cystoid macular edema.   Left Eye Quality was borderline. Scan locations included subfoveal. Central Foveal Thickness: 213. Progression has been stable. Findings include subretinal scarring, disciform scar.   Notes Minor perifoveal CME from BRVO persists much less center involvement today, multiple recurrences in this monocular patient, currently at 5-week interval.  Repeat injection Avastin OD today and follow-up again in 5 weeks.  Numerous vitreous opacifications with shadowing effect upon the macula OD  OS, no active maculopathy, cicatricial scarring Accounts for acuity     Intravitreal Injection, Pharmacologic Agent - OD - Right Eye       Time Out 02/27/2021. 2:46 PM. Confirmed correct patient, procedure, site, and patient consented.   Anesthesia Topical anesthesia was used. Anesthetic medications included Akten 3.5%.   Procedure Preparation included 10% betadine to eyelids, 5% betadine to ocular surface, Tobramycin 0.3%. A 30 gauge needle was used.   Injection: 2.5 mg bevacizumab 2.5 MG/0.1ML   Route: Intravitreal, Site: Right Eye   NDC: [phone]   Post-op Post injection exam found visual acuity of at least counting fingers. The patient tolerated the procedure well. There were no complications. The patient received written and verbal post procedure care education. Post injection medications were not given.              ASSESSMENT/PLAN:  Vitreous membranes and strands, right Patient is significantly troubled today by the ongoing strands and membranes and floaters in the central visual visual axis which hamper her ability to use her only good eye and the most valuable fashion.  She would like to begin plans for surgical intervention on outpatient basis.  Risk and benefits repeat reviewed.  Plan the patient that a typical surgery is 5 to 8 minutes of this type.  Patch to be worn overnight followed thereafter by a visit the next day in the  office and then the week to 10 days later followed thereafter, as needed.  The use of topical drops during that period of 3 weeks with a 10-day limitation of activities     ICD-10-CM   1. Branch retinal vein occlusion with macular edema of right eye  H34.8310 OCT, Retina - OU - Both Eyes    Intravitreal Injection, Pharmacologic Agent - OD - Right Eye    bevacizumab (AVASTIN) SOSY 2.5 mg    2. Vitreous membranes and strands, right  H43.311  1.  Examination today confirms chronic active CME and recurrence from BRVO OD,  needs injection Avastin intravitreal in order to maintain.  2.  Numerous central floaters membranes and strands OD hamper activities of daily living and substantial weight such that it prompting patient to move for surgical intervention OD  3.  Ophthalmic Meds Ordered this visit:  Meds ordered this encounter  Medications   bevacizumab (AVASTIN) SOSY 2.5 mg       Return ,, [facility], for Schedule vitrectomy for vitreous membranes and strands-[id number], OD,, and Avastin OCT 5 weeks.  There are no Patient Instructions on file for this visit.   Explained the diagnoses, plan, and follow up with the patient and they expressed understanding.  Patient expressed understanding of the importance of proper follow up care.   Clent Demark Taleigh Gero M.D. Diseases & Surgery of the Retina and Vitreous Retina & Diabetic [place] 02/27/21     Abbreviations: M myopia (nearsighted); A astigmatism; H hyperopia (farsighted); P presbyopia; Mrx spectacle prescription;  CTL contact lenses; OD right eye; OS left eye; OU both eyes  XT exotropia; ET esotropia; PEK punctate epithelial keratitis; PEE punctate epithelial erosions; DES dry eye syndrome; MGD meibomian gland dysfunction; ATs artificial tears; PFAT's preservative free artificial tears; [place] nuclear sclerotic cataract; PSC posterior subcapsular cataract; ERM epi-retinal membrane; PVD posterior vitreous detachment; RD  retinal detachment; DM diabetes mellitus; DR diabetic retinopathy; NPDR non-proliferative diabetic retinopathy; PDR proliferative diabetic retinopathy; CSME clinically significant macular edema; DME diabetic macular edema; dbh dot blot hemorrhages; CWS cotton wool spot; POAG primary open angle glaucoma; C/D cup-to-disc ratio; HVF humphrey visual field; GVF goldmann visual field; OCT optical coherence tomography; IOP intraocular pressure; BRVO Branch retinal vein occlusion; CRVO central retinal vein occlusion; CRAO central retinal artery occlusion; BRAO branch retinal artery occlusion; RT retinal tear; SB scleral buckle; PPV pars plana vitrectomy; VH Vitreous hemorrhage; PRP panretinal laser photocoagulation; IVK intravitreal kenalog; VMT vitreomacular traction; MH Macular hole;  NVD neovascularization of the disc; NVE neovascularization elsewhere; AREDS age related eye disease study; ARMD age related macular degeneration; POAG primary open angle glaucoma; EBMD epithelial/anterior basement membrane dystrophy; ACIOL anterior chamber intraocular lens; IOL intraocular lens; PCIOL posterior chamber intraocular lens; Phaco/IOL phacoemulsification with intraocular lens placement; [place] photorefractive keratectomy; LASIK laser assisted in situ keratomileusis; HTN hypertension; DM diabetes mellitus; COPD chronic obstructive pulmonary disease

## 2021-02-27 NOTE — Assessment & Plan Note (Signed)
Patient is significantly troubled today by the ongoing strands and membranes and floaters in the central visual visual axis which hamper her ability to use her only good eye and the most valuable fashion.  She would like to begin plans for surgical intervention on outpatient basis.  Risk and benefits repeat reviewed.  Plan the patient that a typical surgery is 5 to 8 minutes of this type.  Patch to be worn overnight followed thereafter by a visit the next day in the office and then the week to 10 days later followed thereafter, as needed.  The use of topical drops during that period of 3 weeks with a 10-day limitation of activities

## 2021-02-28 DIAGNOSIS — E559 Vitamin D deficiency, unspecified: Secondary | ICD-10-CM | POA: Diagnosis not present

## 2021-02-28 DIAGNOSIS — E1122 Type 2 diabetes mellitus with diabetic chronic kidney disease: Secondary | ICD-10-CM | POA: Diagnosis not present

## 2021-02-28 DIAGNOSIS — N390 Urinary tract infection, site not specified: Secondary | ICD-10-CM | POA: Diagnosis not present

## 2021-02-28 DIAGNOSIS — I129 Hypertensive chronic kidney disease with stage 1 through stage 4 chronic kidney disease, or unspecified chronic kidney disease: Secondary | ICD-10-CM | POA: Diagnosis not present

## 2021-02-28 DIAGNOSIS — N189 Chronic kidney disease, unspecified: Secondary | ICD-10-CM | POA: Diagnosis not present

## 2021-02-28 DIAGNOSIS — E1129 Type 2 diabetes mellitus with other diabetic kidney complication: Secondary | ICD-10-CM | POA: Diagnosis not present

## 2021-02-28 DIAGNOSIS — E211 Secondary hyperparathyroidism, not elsewhere classified: Secondary | ICD-10-CM | POA: Diagnosis not present

## 2021-02-28 DIAGNOSIS — R809 Proteinuria, unspecified: Secondary | ICD-10-CM | POA: Diagnosis not present

## 2021-02-28 DIAGNOSIS — I5032 Chronic diastolic (congestive) heart failure: Secondary | ICD-10-CM | POA: Diagnosis not present

## 2021-03-02 ENCOUNTER — Other Ambulatory Visit: Payer: Self-pay | Admitting: Internal Medicine

## 2021-03-07 DIAGNOSIS — E559 Vitamin D deficiency, unspecified: Secondary | ICD-10-CM | POA: Diagnosis not present

## 2021-03-07 DIAGNOSIS — R809 Proteinuria, unspecified: Secondary | ICD-10-CM | POA: Diagnosis not present

## 2021-03-07 DIAGNOSIS — I129 Hypertensive chronic kidney disease with stage 1 through stage 4 chronic kidney disease, or unspecified chronic kidney disease: Secondary | ICD-10-CM | POA: Diagnosis not present

## 2021-03-07 DIAGNOSIS — N189 Chronic kidney disease, unspecified: Secondary | ICD-10-CM | POA: Diagnosis not present

## 2021-03-07 DIAGNOSIS — E211 Secondary hyperparathyroidism, not elsewhere classified: Secondary | ICD-10-CM | POA: Diagnosis not present

## 2021-03-07 DIAGNOSIS — E1122 Type 2 diabetes mellitus with diabetic chronic kidney disease: Secondary | ICD-10-CM | POA: Diagnosis not present

## 2021-03-07 DIAGNOSIS — E1129 Type 2 diabetes mellitus with other diabetic kidney complication: Secondary | ICD-10-CM | POA: Diagnosis not present

## 2021-03-07 DIAGNOSIS — I5032 Chronic diastolic (congestive) heart failure: Secondary | ICD-10-CM | POA: Diagnosis not present

## 2021-03-22 HISTORY — PX: OTHER SURGICAL HISTORY: SHX169

## 2021-04-03 ENCOUNTER — Ambulatory Visit (INDEPENDENT_AMBULATORY_CARE_PROVIDER_SITE_OTHER): Payer: HMO | Admitting: Ophthalmology

## 2021-04-03 ENCOUNTER — Encounter (INDEPENDENT_AMBULATORY_CARE_PROVIDER_SITE_OTHER): Payer: HMO | Admitting: Ophthalmology

## 2021-04-03 ENCOUNTER — Other Ambulatory Visit: Payer: Self-pay

## 2021-04-03 ENCOUNTER — Encounter (INDEPENDENT_AMBULATORY_CARE_PROVIDER_SITE_OTHER): Payer: Self-pay | Admitting: Ophthalmology

## 2021-04-03 ENCOUNTER — Ambulatory Visit: Payer: HMO | Admitting: Sports Medicine

## 2021-04-03 DIAGNOSIS — H34831 Tributary (branch) retinal vein occlusion, right eye, with macular edema: Secondary | ICD-10-CM | POA: Diagnosis not present

## 2021-04-03 DIAGNOSIS — H43311 Vitreous membranes and strands, right eye: Secondary | ICD-10-CM

## 2021-04-03 MED ORDER — OFLOXACIN 0.3 % OP SOLN: 1.0000 [drp] | Freq: Four times a day (QID) | OPHTHALMIC | 0 refills | Status: AC

## 2021-04-03 MED ORDER — PREDNISOLONE ACETATE 1 % OP SUSP: 1.0000 [drp] | Freq: Four times a day (QID) | OPHTHALMIC | 0 refills | Status: AC

## 2021-04-03 MED ORDER — BEVACIZUMAB 2.5 MG/0.1ML IZ SOSY
2.5000 mg | PREFILLED_SYRINGE | INTRAVITREAL | Status: AC | PRN
  Administered 2021-04-03: 2.5 mg via INTRAVITREAL

## 2021-04-03 NOTE — Progress Notes (Signed)
04/03/2021     CHIEF COMPLAINT Patient presents for  Chief Complaint  Patient presents with   Retina Follow Up    5 week FU OD OCT avastin OD Patient states her vision is a little more blurry, she has noticed reading is harder to see small print. Patient states she notices she has a lot more floaters than other days. Patient states her A1C is 7.1, it will be checked again next week. BS this morning was 91.      HISTORY OF PRESENT ILLNESS: Virginia Huber is a 75 y.o. female who presents to the clinic today for:   HPI     Retina Follow Up   Patient presents with  CRVO/BRVO.  In right eye.  This started 5 weeks ago.  Severity is mild.  Duration of 5 weeks. Additional comments: 5 week FU OD OCT avastin OD Patient states her vision is a little more blurry, she has noticed reading is harder to see small print. Patient states she notices she has a lot more floaters than other days. Patient states her A1C is 7.1, it will be checked again next week. BS this morning was 91.        Comments   5 week fu OD oct avastin OD, and pre op OD sx 04/09/2021. Patient states vision is stable and unchanged since last visit. Denies any new floaters or FOL. No allergies to anything we use for injection. LBS: 88 A1C: Pt states "I believe it was 6.9."       Last edited by Laurin Coder on 04/03/2021 10:36 AM.      Referring physician: Sharilyn Sites, MD [address] [place],  [place] [phone]  HISTORICAL INFORMATION:   Selected notes from the MEDICAL RECORD NUMBER    Lab Results  Component Value Date   HGBA1C 7.7 (A) 02/04/2021     CURRENT MEDICATIONS: Current Outpatient Medications (Ophthalmic Drugs)  Medication Sig   ofloxacin (OCUFLOX) 0.3 % ophthalmic solution Place 1 drop into the right eye in the morning, at noon, in the evening, and at bedtime for 21 days.   prednisoLONE acetate (PRED FORTE) 1 % ophthalmic suspension Place 1 drop into the right eye 4 (four) times  daily for 21 days.   No current facility-administered medications for this visit. (Ophthalmic Drugs)   Current Outpatient Medications (Other)  Medication Sig   acetaminophen (TYLENOL) 325 MG tablet Take 650 mg by mouth every 6 (six) hours as needed for headache.   amiodarone (PACERONE) 200 MG tablet Take 0.5 tablets (100 mg total) by mouth daily.   apixaban (ELIQUIS) 5 MG TABS tablet Take 1 tablet (5 mg total) by mouth 2 (two) times daily.   atorvastatin (LIPITOR) 80 MG tablet TAKE ONE TABLET BY MOUTH EVERY DAY AT 6:00PM   carvedilol (COREG) 12.5 MG tablet Take 1 tablet (12.5 mg total) by mouth 2 (two) times daily. (Patient taking differently: Take 12.5 mg by mouth 2 (two) times daily. 1/2 tablet 2 (two) times daily)   Cholecalciferol (VITAMIN D3 PO) Take 1 tablet by mouth daily.    clopidogrel (PLAVIX) 75 MG tablet TAKE ONE TABLET (75MG  TOTAL) BY MOUTH DAILY   Continuous Blood Gluc Receiver (FREESTYLE LIBRE 2 READER) DEVI 1 each by Does not apply route daily.   Continuous Blood Gluc Sensor (FREESTYLE LIBRE 2 SENSOR) MISC 1 each by Does not apply route every 14 (fourteen) days.   cyanocobalamin (,VITAMIN B-12,) 1000 MCG/ML injection Inject 1,000 mcg into the muscle  every 30 (thirty) days. Last injection was around 08/02/19   diltiazem (CARDIZEM CD) 360 MG 24 hr capsule Take 1 capsule (360 mg total) by mouth daily.   Dulaglutide (TRULICITY) 3 XT/0.2IO SOPN Inject 3 mg into the skin once a week.   FREESTYLE LITE test strip 1 each by Other route 2 (two) times daily.    furosemide (LASIX) 40 MG tablet TAKE ONE AND HALF TABLET (60MG  TOTAL) BY MOUTH IN THE MORNING AND ONE TABLET IN THE EVENING (40MG  TOTAL)   hydrALAZINE (APRESOLINE) 50 MG tablet Take 1 tablet (50 mg total) by mouth 3 (three) times daily. (Patient taking differently: Take 50 mg by mouth 2 (two) times daily.)   insulin NPH-regular Human (NOVOLIN 70/30) (70-30) 100 UNIT/ML injection INJECT 35 UNITS BEFORE BREAKFAST AND 35 UNITS BEFORE  SUPPER   levothyroxine (SYNTHROID) 125 MCG tablet Take 1 tablet (125 mcg total) by mouth daily.   NEOMYCIN-POLYMYXIN-HYDROCORTISONE (CORTISPORIN) 1 % SOLN OTIC solution Apply 1-2 drops to nail once daily after shower   nitroGLYCERIN (NITROSTAT) 0.4 MG SL tablet Place 1 tablet (0.4 mg total) under the tongue every 5 (five) minutes as needed for chest pain.   olmesartan (BENICAR) 40 MG tablet Take 40 mg by mouth daily.   potassium chloride SA (KLOR-CON) 20 MEQ tablet TAKE ONE (1) TABLET BY MOUTH EVERY DAY   rOPINIRole (REQUIP) 0.5 MG tablet Take 0.5 mg by mouth at bedtime.    No current facility-administered medications for this visit. (Other)      REVIEW OF SYSTEMS:    ALLERGIES Allergies  Allergen Reactions   Penicillins Hives    Has patient had a PCN reaction causing immediate rash, facial/tongue/throat swelling, SOB or lightheadedness with hypotension: Yes Has patient had a PCN reaction causing severe rash involving mucus membranes or skin necrosis: No Has patient had a PCN reaction that required hospitalization No Has patient had a PCN reaction occurring within the last 10 years: No If all of the above answers are "NO", then may proceed with Cephalosporin use.   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 h/o PAF/flutter during admission in 2013 for PNA. b. PAF during adm for NSTEMI 07/2015, subsequent paroxysms since then.   Atrial  fibrillation with rapid ventricular response (HCC)    B12 deficiency anemia    Bradycardia 06/14/2016   Branch retinal vein occlusion with macular edema of right eye 10/11/2019   Bronchospasm 03/02/2012   CAD in native artery 11/30/2014   Chronic renal failure, stage 3b ([place]) 09/23/2017   Coronary artery disease 11/30/2014   a. remote MI. b. h/o PTCA with scoring balloon to OM1 11/2014. c. NSTEMI 03/2015 s/p DES to prox-mid Cx. d. 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 s/p LE angio 2015; followed by Dr. Fletcher Anon - managed medically.   Pain with urination 05/08/2015   Paroxysmal atrial fibrillation ([place]) 12/08/2017   Pericardial effusion    a. 06/2016 after ppm - s/p pericardiocentesis.   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 s/p Medtronic PPM 06/2016, c/b lead perf/pericardial effusion.   Tamponade    TIA (transient ischemic attack)    Type 2 diabetes with nephropathy ([place]) 02/29/2012   Type II diabetes mellitus (HCC)    Typical atrial flutter (HCC)    UTI (urinary tract infection) 05/08/2013   Past Surgical History:  Procedure Laterality Date   ABDOMINAL AORTAGRAM N/A 01/03/2014   Procedure: ABDOMINAL [name];  Surgeon: Wellington Hampshire, MD;  Location: [facility];  Service: Cardiovascular;  Laterality: N/A;   ABDOMINAL HYSTERECTOMY  1972   "partial"   APPENDECTOMY  1970's   CARDIAC CATHETERIZATION  2008   Tiny OM-2 with 90% narrowing. Med tx.   CARDIAC CATHETERIZATION N/A 11/30/2014   Procedure: Left Heart Cath and Coronary Angiography;  Surgeon: Troy Sine, MD; LAD 20%, CFX 50%, OM1 95%, right PLB 30%, LV normal    CARDIAC CATHETERIZATION N/A 11/30/2014   Procedure: Coronary Balloon Angioplasty;  Surgeon: Troy Sine, MD;  Angiosculpt scoring balloon and PTCA to the OM1 reducing stenosis from 95% to less than 10%   CARDIAC CATHETERIZATION N/A 04/03/2015   Procedure: Left Heart Cath and Coronary Angiography;  Surgeon: Jolaine Artist, MD; dLAD 50%, CFX 90%, OM1 100%, PLA 15%, LVEDP 13     CARDIAC CATHETERIZATION N/A 04/03/2015   Procedure: Coronary Stent Intervention;  Surgeon: Sherren Mocha, MD; 3.0x18 mm Xience DES to the CFX     CARDIAC CATHETERIZATION N/A 08/02/2015   Procedure: Left Heart Cath and Coronary Angiography;  Surgeon: Troy Sine, MD;  Location: [facility];  Service: Cardiovascular;  Laterality: N/A;   CARDIAC CATHETERIZATION N/A 08/02/2015   Procedure: Coronary Stent Intervention;  Surgeon: Troy Sine, MD;  Location: [facility];  Service: Cardiovascular;  Laterality: N/A;   CARDIAC CATHETERIZATION N/A  06/25/2016   Procedure: Pericardiocentesis;  Surgeon: Will Meredith Leeds, MD;  Location: [facility];  Service: Cardiovascular;  Laterality: N/A;   cardiac stents     CARDIOVERSION N/A 12/15/2017   Procedure: CARDIOVERSION;  Surgeon: Acie Fredrickson Wonda Cheng, MD;  Location: [facility];  Service: Cardiovascular;  Laterality:  N/A;   CHOLECYSTECTOMY OPEN  1990's   COLONOSCOPY  2005   Dr. Laural Golden: pancolonic divericula, polyp, path unknown currently   COLONOSCOPY  2012   Dr. Oneida Alar: Normal TI, scattered diverticula in entire colon, small internal hemorrhoids, normal colon biopsies. Colonoscopy in 5-10 years.    COLOSTOMY  05/1979   COLOSTOMY REVERSAL  11/1979   EP IMPLANTABLE DEVICE N/A 06/25/2016   Procedure: Lead Revision/Repair;  Surgeon: Will Meredith Leeds, MD;  Location: [facility];  Service: Cardiovascular;  Laterality: N/A;   EP IMPLANTABLE DEVICE N/A 06/25/2016   Procedure: Pacemaker Implant;  Surgeon: Will Meredith Leeds, MD;  Location: [facility];  Service: Cardiovascular;  Laterality: N/A;   EXCISIONAL HEMORRHOIDECTOMY  1970's   EYE SURGERY Left 2000   "branch vein occlusion"   EYE SURGERY Left ~ 2001   "smoothed out wrinkle"   LEFT OOPHORECTOMY  05/1979   nicked bowel, peritonitis, colostomy; colostomy reversed 1981    LOWER EXTREMITY ANGIOGRAM N/A 01/03/2014   Procedure: LOWER EXTREMITY ANGIOGRAM;  Surgeon: Wellington Hampshire, MD;  Location: [facility];  Service: Cardiovascular;  Laterality: N/A;   Nuclear med stress test  10/2011   Small area of mild ischemia inferoapically.   PARTIAL HYSTERECTOMY  1970's   left ovaries, then ovaries removed later due tumors    RIGHT OOPHORECTOMY  1970's    FAMILY HISTORY Family History  Problem Relation Age of Onset   Heart disease Mother        deceased   Heart disease Father        deceased, heart disease   Diabetes Brother    Heart disease Brother    Thyroid disease Brother    Heart disease Sister    Heart disease  Brother    Thyroid disease Brother    Lupus Daughter    Colon cancer Neg Hx     SOCIAL HISTORY Social History   Tobacco Use   Smoking status: Never   Smokeless tobacco: Never   Tobacco comments:    Never smoked  Vaping Use   Vaping Use: Never used  Substance Use Topics   Alcohol use: No    Alcohol/week: 0.0 standard drinks   Drug use: No         OPHTHALMIC EXAM:  Base Eye Exam     Visual Acuity (ETDRS)       Right Left   Dist cc 20/40 -1 CF at 3'   Dist ph cc NI NI    Correction: Glasses         Tonometry (Tonopen, 10:40 AM)       Right Left   Pressure 8 10         Pupils       Pupils Dark Light Shape React APD   Right PERRL 5 3 Round Brisk None   Left PERRL 5 3 Round Brisk None         Extraocular Movement       Right Left    Full Full         Neuro/Psych     Oriented x3: Yes   Mood/Affect: Normal         Dilation     Right eye: 1.0% Mydriacyl, 2.5% Phenylephrine @ 10:40 AM           Slit Lamp and Fundus Exam     External Exam       Right Left   External Normal Normal  Slit Lamp Exam       Right Left   Lids/Lashes Normal Normal   Conjunctiva/Sclera White and quiet White and quiet   Cornea Clear Clear   Anterior Chamber Deep and quiet Deep and quiet   Iris Round and reactive Round and reactive   Lens Posterior chamber intraocular lens Posterior chamber intraocular lens   Anterior Vitreous Normal Normal         Fundus Exam       Right Left   Posterior Vitreous Posterior vitreous detachment, Central vitreous floaters, membranes and strands centered in the visual axis cloudy    Disc Normal    C/D Ratio 0.2    Macula Hard drusen,    No Cystoid macular edema clinically, Microaneurysms, Retinal pigment epithelial mottling, no hemorrhage, no exudates    Vessels Old branch retinal vein occlusion    Periphery Normal,  , no retinal hole, no retinal tear             IMAGING AND PROCEDURES  Imaging  and Procedures for 04/03/21  OCT, Retina - OU - Both Eyes       Right Eye Quality was good. Scan locations included subfoveal. Central Foveal Thickness: 222. Progression has worsened. Findings include cystoid macular edema.   Left Eye Quality was good. Scan locations included subfoveal. Central Foveal Thickness: 268. Progression has been stable. Findings include subretinal scarring, disciform scar.   Notes Minor perifoveal CME from BRVO persists much less center involvement today, multiple recurrences in this monocular patient, currently at 5-week interval.  Repeat injection Avastin OD today and follow-up again in 5 weeks.  Numerous vitreous opacifications with shadowing effect upon the macula OD  OS, no active maculopathy, cicatricial scarring Accounts for acuity     Intravitreal Injection, Pharmacologic Agent - OD - Right Eye       Time Out 04/03/2021. 11:25 AM. Confirmed correct patient, procedure, site, and patient consented.   Anesthesia Topical anesthesia was used. Anesthetic medications included Akten 3.5%.   Procedure Preparation included 10% betadine to eyelids, 5% betadine to ocular surface, Tobramycin 0.3%. A 30 gauge needle was used.   Injection: 2.5 mg bevacizumab 2.5 MG/0.1ML   Route: Intravitreal, Site: Right Eye   NDC: [phone], Lot: [id number]   Post-op Post injection exam found visual acuity of at least counting fingers. The patient tolerated the procedure well. There were no complications. The patient received written and verbal post procedure care education. Post injection medications were not given.              ASSESSMENT/PLAN:  Branch retinal vein occlusion with macular edema of right eye Chronic active and recurrent BRVO with CME.  Yet stabilized and improved on repeat injections of Avastin.  Today at 5 weeks.  Repeat injection today  Vitreous membranes and strands, right Central vitreous strands and membranes hampering the patient's  ability to function significantly in the only good eye, the right.  Risk benefits reviewed and I discussed with the patient that surgical intervention at the time of surgery her experience to be much like cataract surgery is meaning that she will be awake drowsy carefree but with no sensation of discomfort.  The surgery itself will last somewhere between 8 to 12 minutes where after she will have a patch to use overnight and follow-up.  The next day     ICD-10-CM   1. Branch retinal vein occlusion with macular edema of right eye  H34.8310 OCT, Retina - OU - Both Eyes  Intravitreal Injection, Pharmacologic Agent - OD - Right Eye    bevacizumab (AVASTIN) SOSY 2.5 mg    2. Vitreous membranes and strands, right  H43.311       1.  OD with BRVO and recurrent CME stabilized and improving on intravitreal Avastin.  We will repeat injection today and maintain follow-up for BRVO every 5 weeks.  2.  Significant visual acuity impact by central vitreous floaters and cloudy debris central visual axis OD.  Patient would like to proceed with surgical intervention to clear the view so as to maximize visual potential and abilities and that her only good eye, the right.  3.  Preoperative work was done today including use of preoperative medications and scheduled surgery time is 04-09-2021  Ophthalmic Meds Ordered this visit:  Meds ordered this encounter  Medications   prednisoLONE acetate (PRED FORTE) 1 % ophthalmic suspension    Sig: Place 1 drop into the right eye 4 (four) times daily for 21 days.    Dispense:  5 mL    Refill:  0   ofloxacin (OCUFLOX) 0.3 % ophthalmic solution    Sig: Place 1 drop into the right eye in the morning, at noon, in the evening, and at bedtime for 21 days.    Dispense:  5 mL    Refill:  0   bevacizumab (AVASTIN) SOSY 2.5 mg       Return , [facility], for Already scheduled, vitrectomy, [id number], OD.  There are no Patient Instructions on file for this  visit.   Explained the diagnoses, plan, and follow up with the patient and they expressed understanding.  Patient expressed understanding of the importance of proper follow up care.   Clent Demark Rosabelle Jupin M.D. Diseases & Surgery of the Retina and Vitreous Retina & Diabetic [place] 04/03/21     Abbreviations: M myopia (nearsighted); A astigmatism; H hyperopia (farsighted); P presbyopia; Mrx spectacle prescription;  CTL contact lenses; OD right eye; OS left eye; OU both eyes  XT exotropia; ET esotropia; PEK punctate epithelial keratitis; PEE punctate epithelial erosions; DES dry eye syndrome; MGD meibomian gland dysfunction; ATs artificial tears; PFAT's preservative free artificial tears; [place] nuclear sclerotic cataract; PSC posterior subcapsular cataract; ERM epi-retinal membrane; PVD posterior vitreous detachment; RD retinal detachment; DM diabetes mellitus; DR diabetic retinopathy; NPDR non-proliferative diabetic retinopathy; PDR proliferative diabetic retinopathy; CSME clinically significant macular edema; DME diabetic macular edema; dbh dot blot hemorrhages; CWS cotton wool spot; POAG primary open angle glaucoma; C/D cup-to-disc ratio; HVF humphrey visual field; GVF goldmann visual field; OCT optical coherence tomography; IOP intraocular pressure; BRVO Branch retinal vein occlusion; CRVO central retinal vein occlusion; CRAO central retinal artery occlusion; BRAO branch retinal artery occlusion; RT retinal tear; SB scleral buckle; PPV pars plana vitrectomy; VH Vitreous hemorrhage; PRP panretinal laser photocoagulation; IVK intravitreal kenalog; VMT vitreomacular traction; MH Macular hole;  NVD neovascularization of the disc; NVE neovascularization elsewhere; AREDS age related eye disease study; ARMD age related macular degeneration; POAG primary open angle glaucoma; EBMD epithelial/anterior basement membrane dystrophy; ACIOL anterior chamber intraocular lens; IOL intraocular lens; PCIOL posterior chamber  intraocular lens; Phaco/IOL phacoemulsification with intraocular lens placement; [place] photorefractive keratectomy; LASIK laser assisted in situ keratomileusis; HTN hypertension; DM diabetes mellitus; COPD chronic obstructive pulmonary disease

## 2021-04-03 NOTE — Assessment & Plan Note (Signed)
Chronic active and recurrent BRVO with CME.  Yet stabilized and improved on repeat injections of Avastin.  Today at 5 weeks.  Repeat injection today

## 2021-04-03 NOTE — Addendum Note (Signed)
Addended by: Deloria Lair A on: 04/03/2021 11:29 AM   Modules accepted: Level of Service

## 2021-04-03 NOTE — Assessment & Plan Note (Signed)
Central vitreous strands and membranes hampering the patient's ability to function significantly in the only good eye, the right.  Risk benefits reviewed and I discussed with the patient that surgical intervention at the time of surgery her experience to be much like cataract surgery is meaning that she will be awake drowsy carefree but with no sensation of discomfort.  The surgery itself will last somewhere between 8 to 12 minutes where after she will have a patch to use overnight and follow-up.  The next day

## 2021-04-07 ENCOUNTER — Encounter (INDEPENDENT_AMBULATORY_CARE_PROVIDER_SITE_OTHER): Payer: Self-pay

## 2021-04-07 ENCOUNTER — Telehealth: Payer: Self-pay | Admitting: *Deleted

## 2021-04-07 NOTE — Telephone Encounter (Signed)
Please verify with the surgeon's office that the Plavix will also need to be held.

## 2021-04-07 NOTE — Telephone Encounter (Signed)
Clinical pharmacist to review Eliquis 

## 2021-04-07 NOTE — Telephone Encounter (Signed)
   Name: Virginia Huber  DOB: Jun 09, 1946  MRN: [id number]   Primary Cardiologist: Rozann Lesches, MD  Chart reviewed as part of pre-operative protocol coverage. Patient was contacted 04/07/2021 in reference to pre-operative risk assessment for pending surgery as outlined below.  Virginia Huber was last seen on 02/18/2021 by Darlen Round PA-C.  Since that day, Virginia Huber has done well without chest pain or worsening dyspnea.  Patient has been instructed to hold Eliquis for 1 day prior to the procedure.  She will also hold the Plavix for tomorrow and Wednesday morning prior to her eye surgery.  She may restart both medications as soon as possible at the discretion of Dr. Zadie Rhine.  Therefore, based on ACC/AHA guidelines, the patient would be at acceptable risk for the planned procedure without further cardiovascular testing.   The patient was advised that if she develops new symptoms prior to surgery to contact our office to arrange for a follow-up visit, and she verbalized understanding.  I will route this recommendation to the requesting party via Epic fax function and remove from pre-op pool. Please call with questions.  [place], [place] 04/07/2021, 5:59 PM

## 2021-04-07 NOTE — Telephone Encounter (Signed)
Patient with diagnosis of afib on Eliquis for anticoagulation.    Procedure: vitrectomy right eye Date of procedure: 04/09/21  CHA2DS2-VASc Score = 9  This indicates a 12.2% annual risk of stroke. The patient's score is based upon: CHF History: 1 HTN History: 1 Diabetes History: 1 Stroke History: 2 (retinal vein occlusion) Vascular Disease History: 1 Age Score: 2 Gender Score: 1   CrCl 37mL/min Platelet count 265K  Per office protocol, patient can hold Eliquis for 1 day prior to procedure and resume as soon as safely possible afterwards.

## 2021-04-07 NOTE — Telephone Encounter (Signed)
Patient called in regards to her upcoming surgery. Has it been cleared for Wednesday? Please call # [phone].

## 2021-04-07 NOTE — Telephone Encounter (Signed)
   Pre-operative Risk Assessment    Patient Name: Virginia Huber  DOB: 12/31/1945 MRN: [id number] [place]      Request for Surgical Clearance   Procedure:   VITRECTOMY RIGHT EYE  Date of Surgery: Clearance 04/09/21                                 Surgeon:  DR. Deloria Lair Surgeon's Group or Practice Name:  [facility] Phone number:  [phone] Fax number:  [phone]   Type of Clearance Requested: BOTH PHARMACY AND MEDICAL; HOLD ELIQUIS x 1 DAY PRIOR TO PROCEDURE   Type of Anesthesia:   LOCAL RETROBULBAR, MAC   Additional requests/questions: REQUEST HOLD ELIQUIS x 1 DAY PRIOR TO PROCEDURE  Signed, Julaine Hua   04/07/2021, 10:44 AM

## 2021-04-07 NOTE — Telephone Encounter (Signed)
S/w Dr. Dahlia Bailiff office and confirmed that only the Plavix is needing to be held, and not the Eliquis.

## 2021-04-07 NOTE — Telephone Encounter (Signed)
I spoke with Dr. Zadie Rhine who requested 1 day hold of Eliquis which is consistent with our clinical pharmacist recommendation.  He is aware that we typically recommend a 5-day hold for the Plavix, however there is not enough days between now and day of the surgery.  Dr. Olive Bass says he is fine was performing the surgery if we can hold Plavix for tomorrow and Wednesday morning.  I spoke with the patient, she is doing well.  I instructed her to hold Eliquis for 1 day prior to the surgery and also hold Plavix tomorrow and the following day prior to the surgery as well.

## 2021-04-09 ENCOUNTER — Encounter (AMBULATORY_SURGERY_CENTER): Payer: HMO | Admitting: Ophthalmology

## 2021-04-09 ENCOUNTER — Ambulatory Visit (INDEPENDENT_AMBULATORY_CARE_PROVIDER_SITE_OTHER): Payer: HMO

## 2021-04-09 DIAGNOSIS — H43311 Vitreous membranes and strands, right eye: Secondary | ICD-10-CM | POA: Diagnosis not present

## 2021-04-09 DIAGNOSIS — I480 Paroxysmal atrial fibrillation: Secondary | ICD-10-CM | POA: Diagnosis not present

## 2021-04-10 ENCOUNTER — Ambulatory Visit (INDEPENDENT_AMBULATORY_CARE_PROVIDER_SITE_OTHER): Payer: HMO | Admitting: Ophthalmology

## 2021-04-10 ENCOUNTER — Other Ambulatory Visit: Payer: Self-pay

## 2021-04-10 ENCOUNTER — Encounter (INDEPENDENT_AMBULATORY_CARE_PROVIDER_SITE_OTHER): Payer: Self-pay | Admitting: Ophthalmology

## 2021-04-10 DIAGNOSIS — H43311 Vitreous membranes and strands, right eye: Secondary | ICD-10-CM

## 2021-04-10 LAB — CUP PACEART REMOTE DEVICE CHECK
Battery Remaining Longevity: 67 mo
Battery Voltage: 3 V
Brady Statistic AP VP Percent: 0.02 %
Brady Statistic AP VS Percent: 36.84 %
Brady Statistic AS VP Percent: 0.09 %
Brady Statistic AS VS Percent: 63.05 %
Brady Statistic RA Percent Paced: 36.86 %
Brady Statistic RV Percent Paced: 0.11 %
Date Time Interrogation Session: 20221019083752
Implantable Lead Implant Date: 20180104
Implantable Lead Implant Date: 20180104
Implantable Lead Location: 753859
Implantable Lead Location: 753860
Implantable Lead Model: 5076
Implantable Lead Model: 5076
Implantable Pulse Generator Implant Date: 20180104
Lead Channel Impedance Value: 323 Ohm
Lead Channel Impedance Value: 380 Ohm
Lead Channel Impedance Value: 380 Ohm
Lead Channel Impedance Value: 437 Ohm
Lead Channel Pacing Threshold Amplitude: 0.625 V
Lead Channel Pacing Threshold Amplitude: 0.875 V
Lead Channel Pacing Threshold Pulse Width: 0.4 ms
Lead Channel Pacing Threshold Pulse Width: 0.4 ms
Lead Channel Sensing Intrinsic Amplitude: 1.25 mV
Lead Channel Sensing Intrinsic Amplitude: 1.25 mV
Lead Channel Sensing Intrinsic Amplitude: 9.75 mV
Lead Channel Sensing Intrinsic Amplitude: 9.75 mV
Lead Channel Setting Pacing Amplitude: 2 V
Lead Channel Setting Pacing Amplitude: 2.5 V
Lead Channel Setting Pacing Pulse Width: 0.4 ms
Lead Channel Setting Sensing Sensitivity: 2.8 mV

## 2021-04-10 NOTE — Progress Notes (Signed)
04/10/2021     CHIEF COMPLAINT Patient presents for  Chief Complaint  Patient presents with   Post-op Follow-up      HISTORY OF PRESENT ILLNESS: Virginia Huber is a 75 y.o. female who presents to the clinic today for:   HPI     Post-op Follow-up   In right eye.  Discomfort includes pain (Sore, 4/10, last night pain in the middle of the night. Tylenol taken 2x so far.) and tearing.  Negative for itching and foreign body sensation.  Vision is improved.        Comments   1 day PO OD sx 04/09/2021, vitrectomy.  " Now I do not see floaters!"  Pt states vision has improved slightly, still blurred. Denies new FOL or floaters. Pt is starting back on ofloxacin and prefnisolone qid OD.      Last edited by Hurman Horn, MD on 04/10/2021  9:00 AM.      Referring physician: Sharilyn Sites, MD [address] [place],  [place] [address]  HISTORICAL INFORMATION:   Selected notes from the MEDICAL RECORD NUMBER    Lab Results  Component Value Date   HGBA1C 7.7 (A) 02/04/2021     CURRENT MEDICATIONS: Current Outpatient Medications (Ophthalmic Drugs)  Medication Sig   ofloxacin (OCUFLOX) 0.3 % ophthalmic solution Place 1 drop into the right eye in the morning, at noon, in the evening, and at bedtime for 21 days.   prednisoLONE acetate (PRED FORTE) 1 % ophthalmic suspension Place 1 drop into the right eye 4 (four) times daily for 21 days.   No current facility-administered medications for this visit. (Ophthalmic Drugs)   Current Outpatient Medications (Other)  Medication Sig   acetaminophen (TYLENOL) 325 MG tablet Take 650 mg by mouth every 6 (six) hours as needed for headache.   amiodarone (PACERONE) 200 MG tablet Take 0.5 tablets (100 mg total) by mouth daily.   apixaban (ELIQUIS) 5 MG TABS tablet Take 1 tablet (5 mg total) by mouth 2 (two) times daily.   atorvastatin (LIPITOR) 80 MG tablet TAKE ONE TABLET BY MOUTH EVERY DAY AT 6:00PM   carvedilol (COREG) 12.5 MG  tablet Take 1 tablet (12.5 mg total) by mouth 2 (two) times daily. (Patient taking differently: Take 12.5 mg by mouth 2 (two) times daily. 1/2 tablet 2 (two) times daily)   Cholecalciferol (VITAMIN D3 PO) Take 1 tablet by mouth daily.    clopidogrel (PLAVIX) 75 MG tablet TAKE ONE TABLET (75MG  TOTAL) BY MOUTH DAILY   Continuous Blood Gluc Receiver (FREESTYLE LIBRE 2 READER) DEVI 1 each by Does not apply route daily.   Continuous Blood Gluc Sensor (FREESTYLE LIBRE 2 SENSOR) MISC 1 each by Does not apply route every 14 (fourteen) days.   cyanocobalamin (,VITAMIN B-12,) 1000 MCG/ML injection Inject 1,000 mcg into the muscle every 30 (thirty) days. Last injection was around 08/02/19   diltiazem (CARDIZEM CD) 360 MG 24 hr capsule Take 1 capsule (360 mg total) by mouth daily.   Dulaglutide (TRULICITY) 3 UD/1.4HF SOPN Inject 3 mg into the skin once a week.   FREESTYLE LITE test strip 1 each by Other route 2 (two) times daily.    furosemide (LASIX) 40 MG tablet TAKE ONE AND HALF TABLET (60MG  TOTAL) BY MOUTH IN THE MORNING AND ONE TABLET IN THE EVENING (40MG  TOTAL)   hydrALAZINE (APRESOLINE) 50 MG tablet Take 1 tablet (50 mg total) by mouth 3 (three) times daily. (Patient taking differently: Take 50 mg by mouth 2 (  two) times daily.)   insulin NPH-regular Human (NOVOLIN 70/30) (70-30) 100 UNIT/ML injection INJECT 35 UNITS BEFORE BREAKFAST AND 35 UNITS BEFORE SUPPER   levothyroxine (SYNTHROID) 125 MCG tablet Take 1 tablet (125 mcg total) by mouth daily.   NEOMYCIN-POLYMYXIN-HYDROCORTISONE (CORTISPORIN) 1 % SOLN OTIC solution Apply 1-2 drops to nail once daily after shower   nitroGLYCERIN (NITROSTAT) 0.4 MG SL tablet Place 1 tablet (0.4 mg total) under the tongue every 5 (five) minutes as needed for chest pain.   olmesartan (BENICAR) 40 MG tablet Take 40 mg by mouth daily.   potassium chloride SA (KLOR-CON) 20 MEQ tablet TAKE ONE (1) TABLET BY MOUTH EVERY DAY   rOPINIRole (REQUIP) 0.5 MG tablet Take 0.5 mg by  mouth at bedtime.    No current facility-administered medications for this visit. (Other)      REVIEW OF SYSTEMS:    ALLERGIES Allergies  Allergen Reactions   Penicillins Hives    Has patient had a PCN reaction causing immediate rash, facial/tongue/throat swelling, SOB or lightheadedness with hypotension: Yes Has patient had a PCN reaction causing severe rash involving mucus membranes or skin necrosis: No Has patient had a PCN reaction that required hospitalization No Has patient had a PCN reaction occurring within the last 10 years: No If all of the above answers are "NO", then may proceed with Cephalosporin use.   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 h/o PAF/flutter during admission in 2013 for PNA. b. PAF during adm for NSTEMI 07/2015, subsequent paroxysms since then.   Atrial fibrillation with rapid ventricular response (HCC)    B12 deficiency anemia    Bradycardia 06/14/2016   Branch retinal vein occlusion with macular edema of right eye 10/11/2019   Bronchospasm 03/02/2012   CAD in native artery 11/30/2014   Chronic renal failure, stage 3b ([place]) 09/23/2017   Coronary artery disease 11/30/2014   a. remote MI. b. h/o PTCA with scoring balloon to OM1 11/2014. c. NSTEMI 03/2015 s/p DES to prox-mid Cx. d. 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  s/p LE angio 2015; followed by Dr. Fletcher Anon - managed medically.   Pain with urination 05/08/2015   Paroxysmal atrial fibrillation ([facility] [facility]) 12/08/2017   Pericardial effusion    a. 06/2016 after ppm - s/p pericardiocentesis.   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 s/p Medtronic PPM 06/2016, c/b lead perf/pericardial effusion.   Tamponade    TIA (transient ischemic attack)    Type 2 diabetes with nephropathy ([facility]) 02/29/2012   Type II diabetes mellitus (HCC)    Typical atrial flutter (HCC)    UTI (urinary tract infection) 05/08/2013   Past Surgical History:  Procedure Laterality Date   ABDOMINAL AORTAGRAM N/A 01/03/2014   Procedure: ABDOMINAL [name];   Surgeon: Wellington Hampshire, MD;  Location: [facility];  Service: Cardiovascular;  Laterality: N/A;   ABDOMINAL HYSTERECTOMY  1972   "partial"   APPENDECTOMY  1970's   CARDIAC CATHETERIZATION  2008   Tiny OM-2 with 90% narrowing. Med tx.   CARDIAC CATHETERIZATION N/A 11/30/2014   Procedure: Left Heart Cath and Coronary Angiography;  Surgeon: Troy Sine, MD; LAD 20%, CFX 50%, OM1 95%, right PLB 30%, LV normal    CARDIAC CATHETERIZATION N/A 11/30/2014   Procedure: Coronary Balloon Angioplasty;  Surgeon: Troy Sine, MD;  Angiosculpt scoring balloon and PTCA to the OM1 reducing stenosis from 95% to less than 10%   CARDIAC CATHETERIZATION N/A 04/03/2015   Procedure: Left Heart Cath and Coronary Angiography;  Surgeon: Jolaine Artist, MD; dLAD 50%, CFX 90%, OM1 100%, PLA 15%, LVEDP 13     CARDIAC CATHETERIZATION N/A 04/03/2015   Procedure: Coronary Stent Intervention;  Surgeon: Sherren Mocha, MD; 3.0x18 mm Xience DES to the CFX     CARDIAC CATHETERIZATION N/A 08/02/2015   Procedure: Left Heart Cath and Coronary Angiography;  Surgeon: Troy Sine, MD;  Location: [facility];  Service: Cardiovascular;  Laterality: N/A;   CARDIAC CATHETERIZATION N/A 08/02/2015   Procedure: Coronary Stent Intervention;  Surgeon: Troy Sine, MD;  Location: [facility];  Service: Cardiovascular;  Laterality: N/A;   CARDIAC CATHETERIZATION N/A 06/25/2016   Procedure: Pericardiocentesis;  Surgeon: Will Meredith Leeds, MD;  Location: [facility];  Service: Cardiovascular;  Laterality: N/A;   cardiac stents     CARDIOVERSION N/A 12/15/2017   Procedure: CARDIOVERSION;  Surgeon: Acie Fredrickson Wonda Cheng, MD;  Location: [facility] ENDOSCOPY;  Service: Cardiovascular;  Laterality: N/A;   CHOLECYSTECTOMY OPEN  1990's   COLONOSCOPY  2005   Dr. Laural Golden: pancolonic divericula, polyp, path unknown currently   COLONOSCOPY  2012   Dr. Oneida Alar: Normal TI, scattered diverticula in entire colon, small internal  hemorrhoids, normal colon biopsies. Colonoscopy in 5-10 years.    COLOSTOMY  05/1979   COLOSTOMY REVERSAL  11/1979   EP IMPLANTABLE DEVICE N/A 06/25/2016   Procedure: Lead Revision/Repair;  Surgeon: Will Meredith Leeds, MD;  Location: [facility];  Service: Cardiovascular;  Laterality: N/A;   EP IMPLANTABLE DEVICE N/A 06/25/2016   Procedure: Pacemaker Implant;  Surgeon: Will Meredith Leeds, MD;  Location: [facility];  Service: Cardiovascular;  Laterality: N/A;   EXCISIONAL HEMORRHOIDECTOMY  1970's   EYE SURGERY Left 2000   "branch vein occlusion"   EYE SURGERY Left ~ 2001   "smoothed out wrinkle"   LEFT OOPHORECTOMY  05/1979   nicked bowel, peritonitis, colostomy; colostomy reversed 1981    LOWER EXTREMITY ANGIOGRAM N/A 01/03/2014   Procedure: LOWER EXTREMITY ANGIOGRAM;  Surgeon: Wellington Hampshire, MD;  Location: [facility];  Service: Cardiovascular;  Laterality: N/A;   Nuclear med stress test  10/2011   Small area of mild ischemia inferoapically.   PARTIAL HYSTERECTOMY  1970's   left ovaries, then ovaries removed later due tumors    RIGHT OOPHORECTOMY  1970's    FAMILY HISTORY Family History  Problem Relation Age of Onset   Heart disease Mother        deceased   Heart disease Father        deceased, heart disease   Diabetes Brother    Heart disease Brother    Thyroid disease Brother    Heart disease Sister    Heart disease Brother    Thyroid disease Brother    Lupus Daughter    Colon cancer Neg Hx     SOCIAL HISTORY Social History   Tobacco Use   Smoking status: Never   Smokeless tobacco: Never   Tobacco comments:    Never smoked  Vaping Use   Vaping Use: Never used  Substance Use Topics   Alcohol use: No    Alcohol/week: 0.0 standard drinks   Drug use: No         OPHTHALMIC EXAM:  Base Eye Exam     Visual Acuity (ETDRS)       Right Left   Dist [place] 20/40 +1    Dist cc 20/40 -1 CF at 3'   Dist ph cc NI NI    Correction: Glasses          Tonometry (Tonopen, 8:39 AM)       Right Left   Pressure 14 15         Pupils       Dark Shape React APD   Right [employer]arma. Dilated  fixed None   Left  slightly irregular Minimal None         Extraocular Movement       Right Left    Full Full         Neuro/Psych     Oriented x3: Yes   Mood/Affect: Normal         Dilation     Right eye: 1.0% Mydriacyl, 2.5% Phenylephrine @ 8:39 AM           Slit Lamp and Fundus Exam     External Exam       Right Left   External Normal Normal         Slit Lamp Exam       Right Left   Lids/Lashes Normal Normal   Conjunctiva/Sclera White and quiet White and quiet   Cornea Clear Clear   Anterior Chamber Deep and quiet Deep and quiet   Iris Round and reactive Round and reactive   Lens Posterior chamber intraocular lens Posterior chamber intraocular lens   Anterior Vitreous Normal Normal         Fundus Exam       Right Left   Posterior Vitreous Clear and avitric, no debris visible    Disc Normal    C/D Ratio 0.2  Macula Hard drusen,    No Cystoid macular edema clinically, Microaneurysms, Retinal pigment epithelial mottling, no hemorrhage, no exudates    Vessels Old branch retinal vein occlusion    Periphery Normal,  , no retinal hole, no retinal tear             IMAGING AND PROCEDURES  Imaging and Procedures for 04/10/21           ASSESSMENT/PLAN:  Vitreous membranes and strands, right OD, status post vitrectomy postop day #1 for vitreous membrane strands and floaters.  Looks great today symptomatically as well as on clinical examination     ICD-10-CM   1. Vitreous membranes and strands, right  H43.311       1.  Postop day #1 status post vitrectomy for membranes and strands and severe floaters in the visual axis.  Patient happily  reports visual acuity same as well as no further floaters seen  2.  Restrictions reviewed with the patient  3.  Ophthalmic Meds Ordered this visit:  No  orders of the defined types were placed in this encounter.      Return in about 1 week (around 04/17/2021) for dilate, OD, POST OP, COLOR FP.  Patient Instructions  Ofloxacin  4 times daily to the operative eye  Prednisolone acetate 1 drop to the operative eye 4 times daily  Patient instructed not to refill the medications and use them for maximum of 3 weeks.  Patient instructed do not rub the eye.  Patient has the option to use the patch at night.      Explained the diagnoses, plan, and follow up with the patient and they expressed understanding.  Patient expressed understanding of the importance of proper follow up care.   Clent Demark Mystery Schrupp M.D. Diseases & Surgery of the Retina and Vitreous [facility] 04/10/21     Abbreviations: M myopia (nearsighted); A astigmatism; H hyperopia (farsighted); P presbyopia; Mrx spectacle prescription;  CTL contact lenses; OD right eye; OS left eye; OU both eyes  XT exotropia; ET esotropia; PEK punctate epithelial keratitis; PEE punctate epithelial erosions; DES dry eye syndrome; MGD meibomian gland dysfunction; ATs artificial tears; PFAT's preservative free artificial tears; [place] nuclear sclerotic cataract; PSC posterior subcapsular cataract; ERM epi-retinal membrane; PVD posterior vitreous detachment; RD retinal detachment; DM diabetes mellitus; DR diabetic retinopathy; NPDR non-proliferative diabetic retinopathy; PDR proliferative diabetic retinopathy; CSME clinically significant macular edema; DME diabetic macular edema; dbh dot blot hemorrhages; CWS cotton wool spot; POAG primary open angle glaucoma; C/D cup-to-disc ratio; HVF humphrey visual field; GVF goldmann visual field; OCT optical coherence tomography; IOP intraocular pressure; BRVO Branch retinal vein occlusion; CRVO central retinal vein occlusion; CRAO central retinal artery occlusion; BRAO branch retinal artery occlusion; RT retinal tear; SB scleral buckle; PPV pars plana  vitrectomy; VH Vitreous hemorrhage; PRP panretinal laser photocoagulation; IVK intravitreal kenalog; VMT vitreomacular traction; MH Macular hole;  NVD neovascularization of the disc; NVE neovascularization elsewhere; AREDS age related eye disease study; ARMD age related macular degeneration; POAG primary open angle glaucoma; EBMD epithelial/anterior basement membrane dystrophy; ACIOL anterior chamber intraocular lens; IOL intraocular lens; PCIOL posterior chamber intraocular lens; Phaco/IOL phacoemulsification with intraocular lens placement; [place] photorefractive keratectomy; LASIK laser assisted in situ keratomileusis; HTN hypertension; DM diabetes mellitus; COPD chronic obstructive pulmonary disease

## 2021-04-10 NOTE — Assessment & Plan Note (Signed)
OD, status post vitrectomy postop day #1 for vitreous membrane strands and floaters.  Looks great today symptomatically as well as on clinical examination

## 2021-04-10 NOTE — Patient Instructions (Signed)
Ofloxacin  4 times daily to the operative eye  Prednisolone acetate 1 drop to the operative eye 4 times daily  Patient instructed not to refill the medications and use them for maximum of 3 weeks.  Patient instructed do not rub the eye.  Patient has the option to use the patch at night. 

## 2021-04-14 DIAGNOSIS — Z23 Encounter for immunization: Secondary | ICD-10-CM | POA: Diagnosis not present

## 2021-04-14 NOTE — Progress Notes (Signed)
Remote pacemaker transmission.   

## 2021-04-16 ENCOUNTER — Encounter (INDEPENDENT_AMBULATORY_CARE_PROVIDER_SITE_OTHER): Payer: Self-pay | Admitting: Ophthalmology

## 2021-04-16 ENCOUNTER — Other Ambulatory Visit: Payer: Self-pay

## 2021-04-16 ENCOUNTER — Ambulatory Visit (INDEPENDENT_AMBULATORY_CARE_PROVIDER_SITE_OTHER): Payer: HMO | Admitting: Ophthalmology

## 2021-04-16 ENCOUNTER — Encounter (INDEPENDENT_AMBULATORY_CARE_PROVIDER_SITE_OTHER): Payer: HMO | Admitting: Ophthalmology

## 2021-04-16 DIAGNOSIS — H34831 Tributary (branch) retinal vein occlusion, right eye, with macular edema: Secondary | ICD-10-CM

## 2021-04-16 DIAGNOSIS — H43311 Vitreous membranes and strands, right eye: Secondary | ICD-10-CM | POA: Diagnosis not present

## 2021-04-16 NOTE — Progress Notes (Addendum)
04/16/2021     CHIEF COMPLAINT Patient presents for  Chief Complaint  Patient presents with   Post-op Follow-up      HISTORY OF PRESENT ILLNESS: Virginia Huber is a 75 y.o. female who presents to the clinic today for:   HPI     Post-op Follow-up   In right eye.  Discomfort includes Negative for pain, itching, foreign body sensation, tearing, discharge and floaters.        Comments   1 week post op od fp sx 04/09/2021 Pt states, "My vision is better because I do not have all those black spots but it is still some blurry which I am sure will get better." Pt reports using Cipro and Pred drops QID OD A1C: 7.0 LBS: 200      Last edited by Kendra Opitz, COA on 04/16/2021  9:11 AM.      Referring physician: Sharilyn Sites, MD [address] [place],  [place] [address]  HISTORICAL INFORMATION:   Selected notes from the MEDICAL RECORD NUMBER    Lab Results  Component Value Date   HGBA1C 7.7 (A) 02/04/2021     CURRENT MEDICATIONS: Current Outpatient Medications (Ophthalmic Drugs)  Medication Sig   ofloxacin (OCUFLOX) 0.3 % ophthalmic solution Place 1 drop into the right eye in the morning, at noon, in the evening, and at bedtime for 21 days.   prednisoLONE acetate (PRED FORTE) 1 % ophthalmic suspension Place 1 drop into the right eye 4 (four) times daily for 21 days.   No current facility-administered medications for this visit. (Ophthalmic Drugs)   Current Outpatient Medications (Other)  Medication Sig   acetaminophen (TYLENOL) 325 MG tablet Take 650 mg by mouth every 6 (six) hours as needed for headache.   amiodarone (PACERONE) 200 MG tablet Take 0.5 tablets (100 mg total) by mouth daily.   apixaban (ELIQUIS) 5 MG TABS tablet Take 1 tablet (5 mg total) by mouth 2 (two) times daily.   atorvastatin (LIPITOR) 80 MG tablet TAKE ONE TABLET BY MOUTH EVERY DAY AT 6:00PM   carvedilol (COREG) 12.5 MG tablet Take 1 tablet (12.5 mg total) by mouth 2 (two) times  daily. (Patient taking differently: Take 12.5 mg by mouth 2 (two) times daily. 1/2 tablet 2 (two) times daily)   Cholecalciferol (VITAMIN D3 PO) Take 1 tablet by mouth daily.    clopidogrel (PLAVIX) 75 MG tablet TAKE ONE TABLET (75MG  TOTAL) BY MOUTH DAILY   Continuous Blood Gluc Receiver (FREESTYLE LIBRE 2 READER) DEVI 1 each by Does not apply route daily.   Continuous Blood Gluc Sensor (FREESTYLE LIBRE 2 SENSOR) MISC 1 each by Does not apply route every 14 (fourteen) days.   cyanocobalamin (,VITAMIN B-12,) 1000 MCG/ML injection Inject 1,000 mcg into the muscle every 30 (thirty) days. Last injection was around 08/02/19   diltiazem (CARDIZEM CD) 360 MG 24 hr capsule Take 1 capsule (360 mg total) by mouth daily.   Dulaglutide (TRULICITY) 3 MV/7.8IO SOPN Inject 3 mg into the skin once a week.   FREESTYLE LITE test strip 1 each by Other route 2 (two) times daily.    furosemide (LASIX) 40 MG tablet TAKE ONE AND HALF TABLET (60MG  TOTAL) BY MOUTH IN THE MORNING AND ONE TABLET IN THE EVENING (40MG  TOTAL)   hydrALAZINE (APRESOLINE) 50 MG tablet Take 1 tablet (50 mg total) by mouth 3 (three) times daily. (Patient taking differently: Take 50 mg by mouth 2 (two) times daily.)   insulin NPH-regular Human (NOVOLIN 70/30) (  70-30) 100 UNIT/ML injection INJECT 35 UNITS BEFORE BREAKFAST AND 35 UNITS BEFORE SUPPER   levothyroxine (SYNTHROID) 125 MCG tablet Take 1 tablet (125 mcg total) by mouth daily.   NEOMYCIN-POLYMYXIN-HYDROCORTISONE (CORTISPORIN) 1 % SOLN OTIC solution Apply 1-2 drops to nail once daily after shower   nitroGLYCERIN (NITROSTAT) 0.4 MG SL tablet Place 1 tablet (0.4 mg total) under the tongue every 5 (five) minutes as needed for chest pain.   olmesartan (BENICAR) 40 MG tablet Take 40 mg by mouth daily.   potassium chloride SA (KLOR-CON) 20 MEQ tablet TAKE ONE (1) TABLET BY MOUTH EVERY DAY   rOPINIRole (REQUIP) 0.5 MG tablet Take 0.5 mg by mouth at bedtime.    No current facility-administered  medications for this visit. (Other)      REVIEW OF SYSTEMS:    ALLERGIES Allergies  Allergen Reactions   Penicillins Hives    Has patient had a PCN reaction causing immediate rash, facial/tongue/throat swelling, SOB or lightheadedness with hypotension: Yes Has patient had a PCN reaction causing severe rash involving mucus membranes or skin necrosis: No Has patient had a PCN reaction that required hospitalization No Has patient had a PCN reaction occurring within the last 10 years: No If all of the above answers are "NO", then may proceed with Cephalosporin use.   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 h/o PAF/flutter during admission in 2013 for PNA. b. PAF during adm for NSTEMI 07/2015, subsequent paroxysms since then.   Atrial fibrillation with rapid ventricular response (HCC)    B12 deficiency anemia    Bradycardia 06/14/2016   Branch retinal vein occlusion with macular edema of right eye 10/11/2019   Bronchospasm 03/02/2012   CAD in native artery 11/30/2014   Chronic renal failure, stage 3b ([place]) 09/23/2017   Coronary artery disease 11/30/2014   a. remote MI. b. h/o PTCA with scoring balloon to OM1 11/2014. c. NSTEMI 03/2015 s/p DES to prox-mid Cx. d. 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 s/p LE angio 2015; followed by Dr. Fletcher Anon - managed  medically.   Pain with urination 05/08/2015   Paroxysmal atrial fibrillation ([place]) 12/08/2017   Pericardial effusion    a. 06/2016 after ppm - s/p pericardiocentesis.   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 s/p Medtronic PPM 06/2016, c/b lead perf/pericardial effusion.   Tamponade    TIA (transient ischemic attack)    Type 2 diabetes with nephropathy ([place]) 02/29/2012   Type II diabetes mellitus (HCC)    Typical atrial flutter (HCC)    UTI (urinary tract infection) 05/08/2013   Past Surgical History:  Procedure Laterality Date   ABDOMINAL AORTAGRAM N/A 01/03/2014   Procedure: ABDOMINAL [name];  Surgeon: Wellington Hampshire, MD;  Location: [facility] CATH LAB;   Service: Cardiovascular;  Laterality: N/A;   ABDOMINAL HYSTERECTOMY  1972   "partial"   APPENDECTOMY  1970's   CARDIAC CATHETERIZATION  2008   Tiny OM-2 with 90% narrowing. Med tx.   CARDIAC CATHETERIZATION N/A 11/30/2014   Procedure: Left Heart Cath and Coronary Angiography;  Surgeon: Troy Sine, MD; LAD 20%, CFX 50%, OM1 95%, right PLB 30%, LV normal    CARDIAC CATHETERIZATION N/A 11/30/2014   Procedure: Coronary Balloon Angioplasty;  Surgeon: Troy Sine, MD;  Angiosculpt scoring balloon and PTCA to the OM1 reducing stenosis from 95% to less than 10%   CARDIAC CATHETERIZATION N/A 04/03/2015   Procedure: Left Heart Cath and Coronary Angiography;  Surgeon: Jolaine Artist, MD; dLAD 50%, CFX 90%, OM1 100%, PLA 15%, LVEDP 13     CARDIAC CATHETERIZATION N/A 04/03/2015   Procedure: Coronary Stent Intervention;  Surgeon: Sherren Mocha, MD; 3.0x18 mm Xience DES to the CFX     CARDIAC CATHETERIZATION N/A 08/02/2015   Procedure: Left Heart Cath and Coronary Angiography;  Surgeon: Troy Sine, MD;  Location: [facility] CV LAB;  Service: Cardiovascular;  Laterality: N/A;   CARDIAC CATHETERIZATION N/A 08/02/2015   Procedure: Coronary Stent Intervention;  Surgeon: Troy Sine, MD;  Location: [facility] CV LAB;  Service: Cardiovascular;  Laterality: N/A;   CARDIAC CATHETERIZATION N/A 06/25/2016   Procedure: Pericardiocentesis;  Surgeon: Will Meredith Leeds, MD;  Location: [facility] CV LAB;  Service: Cardiovascular;  Laterality: N/A;   cardiac stents     CARDIOVERSION N/A 12/15/2017   Procedure: CARDIOVERSION;  Surgeon: Acie Fredrickson Wonda Cheng, MD;  Location: [facility] ENDOSCOPY;  Service: Cardiovascular;  Laterality: N/A;   CHOLECYSTECTOMY OPEN  1990's   COLONOSCOPY  2005   Dr. Laural Golden: pancolonic divericula, polyp, path unknown currently   COLONOSCOPY  2012   Dr. Oneida Alar: Normal TI, scattered diverticula in entire colon, small internal hemorrhoids, normal colon biopsies. Colonoscopy in 5-10 years.     COLOSTOMY  05/1979   COLOSTOMY REVERSAL  11/1979   EP IMPLANTABLE DEVICE N/A 06/25/2016   Procedure: Lead Revision/Repair;  Surgeon: Will Meredith Leeds, MD;  Location: [facility] CV LAB;  Service: Cardiovascular;  Laterality: N/A;   EP IMPLANTABLE DEVICE N/A 06/25/2016   Procedure: Pacemaker Implant;  Surgeon: Will Meredith Leeds, MD;  Location: [facility] CV LAB;  Service: Cardiovascular;  Laterality: N/A;   EXCISIONAL HEMORRHOIDECTOMY  1970's   EYE SURGERY Left 2000   "branch vein occlusion"   EYE SURGERY Left ~ 2001   "smoothed out wrinkle"   LEFT OOPHORECTOMY  05/1979   nicked bowel, peritonitis, colostomy; colostomy reversed 1981    LOWER EXTREMITY ANGIOGRAM N/A 01/03/2014   Procedure: LOWER EXTREMITY ANGIOGRAM;  Surgeon: Wellington Hampshire, MD;  Location: [facility] CATH LAB;  Service: Cardiovascular;  Laterality: N/A;   Nuclear med stress test  10/2011   Small area of mild ischemia inferoapically.   PARTIAL HYSTERECTOMY  1970's   left ovaries, then ovaries removed later due tumors    RIGHT OOPHORECTOMY  1970's    FAMILY HISTORY Family History  Problem Relation Age of Onset   Heart disease Mother        deceased   Heart disease Father        deceased, heart disease   Diabetes Brother    Heart disease Brother    Thyroid disease Brother    Heart disease Sister    Heart disease Brother    Thyroid disease Brother    Lupus Daughter    Colon cancer Neg Hx     SOCIAL HISTORY Social History   Tobacco Use   Smoking status: Never   Smokeless tobacco: Never   Tobacco comments:    Never smoked  Vaping Use   Vaping Use: Never used  Substance Use Topics   Alcohol use: No    Alcohol/week: 0.0 standard drinks   Drug use: No         OPHTHALMIC EXAM:  Base Eye Exam     Visual Acuity (ETDRS)       Right Left   Dist cc 20/40 CF at face   Dist ph cc NI NI         Tonometry (Tonopen, 9:15 AM)       Right Left   Pressure 10 12         Pupils       Pupils Dark Light  Shape React APD   Right PERRL 5 4 Round Brisk None   Left PERRL 5 4 Round Brisk None         Visual Fields (Counting fingers)       Left Right    Full Full         Extraocular Movement       Right Left    Full Full         Neuro/Psych     Oriented x3: Yes   Mood/Affect: Normal         Dilation     Right eye: 1.0% Mydriacyl, 2.5% Phenylephrine @ 9:15 AM           Slit Lamp and Fundus Exam     External Exam       Right Left   External Normal Normal         Slit Lamp Exam       Right Left   Lids/Lashes Normal Normal   Conjunctiva/Sclera White and quiet White and quiet   Cornea Clear Clear   Anterior Chamber Deep and quiet Deep and quiet   Iris Round and reactive Round and reactive   Lens Posterior chamber intraocular lens Posterior chamber intraocular lens   Anterior Vitreous Normal, no [place] Normal         Fundus Exam       Right Left   Posterior Vitreous Clear and avitric, no debris visible    Disc Normal  C/D Ratio 0.2    Macula Hard drusen,    No Cystoid macular edema clinically, Microaneurysms, Retinal pigment epithelial mottling, no hemorrhage, no exudates    Vessels Old branch retinal vein occlusion    Periphery Normal,  , no retinal hole, no retinal tear, good peripheral PRP sector             IMAGING AND PROCEDURES  Imaging and Procedures for 04/16/21  OCT, Retina - OU - Both Eyes       Right Eye Quality was good. Scan locations included subfoveal. Central Foveal Thickness: 253. Progression has worsened. Findings include cystoid macular edema.   Left Eye Quality was good. Scan locations included subfoveal. Central Foveal Thickness: 268. Progression has been stable. Findings include subretinal scarring, disciform scar.   Notes Minor perifoveal CME from BRVO persists much less center involvement today, multiple recurrences in this monocular patient, however now with no vitreous debris   OS, no active  maculopathy, cicatricial scarring Accounts for acuity     Color Fundus Photography Optos - OU - Both Eyes       Right Eye Progression has improved. Disc findings include normal observations. Macula : edema.   Left Eye Progression has been stable. Macula : geographic atrophy.   Notes Old BRVO OD, minor CME, no vitreous debris remains on room for  OS still with disciform Scar centrally OS              ASSESSMENT/PLAN:  No problem-specific Assessment & Plan notes found for this encounter.      ICD-10-CM   1. Vitreous membranes and strands, right  H43.311 OCT, Retina - OU - Both Eyes    Color Fundus Photography Optos - OU - Both Eyes    2. Branch retinal vein occlusion with macular edema of right eye  H34.8310 OCT, Retina - OU - Both Eyes    Color Fundus Photography Optos - OU - Both Eyes      1.  OD much improved retinal funduscopic viewed as vitreous debris membrane strands and floaters have been diminished nicely post vitrectomy 1 week previous.  Patient reports improvement of visual acuity and functioning in this monocular case post removal of dense vitreous membrane strands and floaters  2.  OD looks great no inflammatory reactions  3.  OD with underlying BRVO not related to the previous vitreous debris membranes and strands, will need follow-up in 3 to 4 weeks and likely injection to control the CME  Ophthalmic Meds Ordered this visit:  No orders of the defined types were placed in this encounter.      Return in about 3 weeks (around 05/07/2021) for dilate, OD, AVASTIN OCT, for BRVO.  Patient Instructions  Ofloxacin  4 times daily to the operative eye  Prednisolone acetate 1 drop to the operative eye 4 times daily  Patient instructed not to refill the medications and use them for maximum of 3 weeks, from the time of surgery  Patient instructed do not rub the eye.  Patient has the option to use the patch at night.    Explained the diagnoses, plan, and  follow up with the patient and they expressed understanding.  Patient expressed understanding of the importance of proper follow up care.   Clent Demark Franki Alcaide M.D. Diseases & Surgery of the Retina and Vitreous [facility] [place] 04/16/21     Abbreviations: M myopia (nearsighted); A astigmatism; H hyperopia (farsighted); P presbyopia; Mrx spectacle prescription;  CTL contact lenses; OD  right eye; OS left eye; OU both eyes  XT exotropia; ET esotropia; PEK punctate epithelial keratitis; PEE punctate epithelial erosions; DES dry eye syndrome; MGD meibomian gland dysfunction; ATs artificial tears; PFAT's preservative free artificial tears; [place] nuclear sclerotic cataract; PSC posterior subcapsular cataract; ERM epi-retinal membrane; PVD posterior vitreous detachment; RD retinal detachment; DM diabetes mellitus; DR diabetic retinopathy; NPDR non-proliferative diabetic retinopathy; PDR proliferative diabetic retinopathy; CSME clinically significant macular edema; DME diabetic macular edema; dbh dot blot hemorrhages; CWS cotton wool spot; POAG primary open angle glaucoma; C/D cup-to-disc ratio; HVF humphrey visual field; GVF goldmann visual field; OCT optical coherence tomography; IOP intraocular pressure; BRVO Branch retinal vein occlusion; CRVO central retinal vein occlusion; CRAO central retinal artery occlusion; BRAO branch retinal artery occlusion; RT retinal tear; SB scleral buckle; PPV pars plana vitrectomy; VH Vitreous hemorrhage; PRP panretinal laser photocoagulation; IVK intravitreal kenalog; VMT vitreomacular traction; MH Macular hole;  NVD neovascularization of the disc; NVE neovascularization elsewhere; AREDS age related eye disease study; ARMD age related macular degeneration; POAG primary open angle glaucoma; EBMD epithelial/anterior basement membrane dystrophy; ACIOL anterior chamber intraocular lens; IOL intraocular lens; PCIOL posterior chamber intraocular lens; Phaco/IOL  phacoemulsification with intraocular lens placement; [place] photorefractive keratectomy; LASIK laser assisted in situ keratomileusis; HTN hypertension; DM diabetes mellitus; COPD chronic obstructive pulmonary disease

## 2021-04-16 NOTE — Patient Instructions (Signed)
Ofloxacin  4 times daily to the operative eye  Prednisolone acetate 1 drop to the operative eye 4 times daily  Patient instructed not to refill the medications and use them for maximum of 3 weeks, from the time of surgery  Patient instructed do not rub the eye.  Patient has the option to use the patch at night.

## 2021-04-21 DIAGNOSIS — I509 Heart failure, unspecified: Secondary | ICD-10-CM | POA: Diagnosis not present

## 2021-04-21 DIAGNOSIS — E1122 Type 2 diabetes mellitus with diabetic chronic kidney disease: Secondary | ICD-10-CM | POA: Diagnosis not present

## 2021-04-21 DIAGNOSIS — N184 Chronic kidney disease, stage 4 (severe): Secondary | ICD-10-CM | POA: Diagnosis not present

## 2021-04-21 DIAGNOSIS — I130 Hypertensive heart and chronic kidney disease with heart failure and stage 1 through stage 4 chronic kidney disease, or unspecified chronic kidney disease: Secondary | ICD-10-CM | POA: Diagnosis not present

## 2021-05-01 ENCOUNTER — Ambulatory Visit (INDEPENDENT_AMBULATORY_CARE_PROVIDER_SITE_OTHER): Payer: HMO | Admitting: Sports Medicine

## 2021-05-01 ENCOUNTER — Other Ambulatory Visit: Payer: Self-pay

## 2021-05-01 ENCOUNTER — Encounter: Payer: Self-pay | Admitting: Sports Medicine

## 2021-05-01 DIAGNOSIS — E119 Type 2 diabetes mellitus without complications: Secondary | ICD-10-CM | POA: Diagnosis not present

## 2021-05-01 DIAGNOSIS — B351 Tinea unguium: Secondary | ICD-10-CM

## 2021-05-01 DIAGNOSIS — M79674 Pain in right toe(s): Secondary | ICD-10-CM | POA: Diagnosis not present

## 2021-05-01 DIAGNOSIS — Z7901 Long term (current) use of anticoagulants: Secondary | ICD-10-CM

## 2021-05-01 DIAGNOSIS — M79675 Pain in left toe(s): Secondary | ICD-10-CM | POA: Diagnosis not present

## 2021-05-01 DIAGNOSIS — M21619 Bunion of unspecified foot: Secondary | ICD-10-CM

## 2021-05-01 DIAGNOSIS — M2040 Other hammer toe(s) (acquired), unspecified foot: Secondary | ICD-10-CM

## 2021-05-01 NOTE — Progress Notes (Signed)
Subjective: Virginia Huber is a 75 y.o. female patient with history of diabetes who presents to office today complaining of long,mildly painful nails while ambulating in shoes; unable to trim. Reports Corticosporin has helped the toes.  Reports that she also has had eye surgery since her last encounter and is doing well.  Patient states that she is noticing that her right second toe is sticking out more and rubs in shoes.  Patient Active Problem List   Diagnosis Date Noted   Vitreous membranes and strands, right 01/21/2021   Advanced nonexudative age-related macular degeneration of left eye with subfoveal involvement 06/26/2020   Stable central retinal vein occlusion of left eye 05/13/2020   Early stage nonexudative age-related macular degeneration of right eye 02/27/2020   OSA on CPAP 02/12/2020   Branch retinal vein occlusion with macular edema of right eye 10/11/2019   Severe nonproliferative diabetic retinopathy of right eye, with macular edema, associated with type 2 diabetes mellitus ([place]) 10/11/2019   Right retinal defect 10/11/2019   Posterior vitreous detachment of right eye 10/11/2019   Retinal microaneurysm of right eye 10/11/2019   CHF exacerbation ([place]) 08/25/2019   Acute exacerbation of CHF (congestive heart failure) ([place]) 08/24/2019   Acute on chronic diastolic HF (heart failure) ([place]) 12/17/2017   GERD (gastroesophageal reflux disease) 12/17/2017   Typical atrial flutter (HCC)    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   Paroxysmal atrial fibrillation ([place]) 12/08/2017   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   Chronic renal failure, stage 3b ([place]) 09/23/2017   Fever 09/23/2017   S/P pericardiocentesis 06/28/2016   Acute blood loss anemia 06/28/2016   Pericardial effusion 06/26/2016   Tachy-brady syndrome ([place]) 06/25/2016   Tamponade    Bradycardia 06/14/2016   Junctional bradycardia    Coronary artery disease involving coronary  bypass graft of native heart with unstable angina pectoris (HCC)    Acute diastolic CHF (congestive heart failure), NYHA class 3 (HCC)    Systolic congestive heart failure ([place]) 05/13/2016   Multiple and bilateral precerebral artery syndromes 05/13/2016   OSA (obstructive sleep apnea) 05/13/2016   Chronic diarrhea 12/25/2015   Chest pain 08/02/2015   Atrial fibrillation with rapid ventricular response (HCC)    Pain with urination 05/08/2015   NSTEMI (non-ST elevated myocardial infarction) ([place]) 04/02/2015   CAD in native artery 11/30/2014   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    PAD (peripheral artery disease) ([place]) 12/26/2013   Superficial fungus infection of skin 06/29/2013   UTI (urinary tract infection) 05/08/2013   Hypokalemia 03/05/2012   B12 deficiency anemia 03/02/2012   Bronchospasm 03/02/2012   Community acquired bacterial pneumonia 03/01/2012   Acute respiratory failure with hypoxia ([place]) 03/01/2012   Type 2 diabetes with nephropathy ([place]) 02/29/2012   Hypothyroidism 02/28/2012   RLQ abdominal pain 11/24/2010   OVERWEIGHT/OBESITY 06/03/2010   Essential hypertension 06/03/2010   Overweight 06/03/2010   Mixed hyperlipidemia 12/27/2009   Palpitations 05/17/2009   FRACTURE, TOE 12/06/2007   Current Outpatient Medications on File Prior to Visit  Medication Sig Dispense Refill   acetaminophen (TYLENOL) 325 MG tablet Take 650 mg by mouth every 6 (six) hours as needed for headache.     amiodarone (PACERONE) 200 MG tablet Take 0.5 tablets (100 mg total) by mouth daily. 45 tablet 1   apixaban (ELIQUIS) 5 MG TABS tablet Take 1 tablet (5 mg total) by mouth 2 (two) times daily. 28 tablet 0  atorvastatin (LIPITOR) 80 MG tablet TAKE ONE TABLET BY MOUTH EVERY DAY AT 6:00PM 90 tablet 3   carvedilol (COREG) 12.5 MG tablet Take 1 tablet (12.5 mg total) by mouth 2 (two) times daily. (Patient taking differently: Take 12.5 mg by mouth  2 (two) times daily. 1/2 tablet 2 (two) times daily) 180 tablet 3   Cholecalciferol (VITAMIN D3 PO) Take 1 tablet by mouth daily.      clopidogrel (PLAVIX) 75 MG tablet TAKE ONE TABLET (75MG  TOTAL) BY MOUTH DAILY 90 tablet 3   Continuous Blood Gluc Receiver (FREESTYLE LIBRE 2 READER) DEVI 1 each by Does not apply route daily. 1 each 0   Continuous Blood Gluc Sensor (FREESTYLE LIBRE 2 SENSOR) MISC 1 each by Does not apply route every 14 (fourteen) days. 6 each 3   cyanocobalamin (,VITAMIN B-12,) 1000 MCG/ML injection Inject 1,000 mcg into the muscle every 30 (thirty) days. Last injection was around 08/02/19     diltiazem (CARDIZEM CD) 360 MG 24 hr capsule Take 1 capsule (360 mg total) by mouth daily. 90 capsule 3   Dulaglutide (TRULICITY) 3 VE/9.3YB SOPN Inject 3 mg into the skin once a week. 6 mL 3   FREESTYLE LITE test strip 1 each by Other route 2 (two) times daily.      furosemide (LASIX) 40 MG tablet TAKE ONE AND HALF TABLET (60MG  TOTAL) BY MOUTH IN THE MORNING AND ONE TABLET IN THE EVENING (40MG  TOTAL) 180 tablet 4   hydrALAZINE (APRESOLINE) 50 MG tablet Take 1 tablet (50 mg total) by mouth 3 (three) times daily. (Patient taking differently: Take 50 mg by mouth 2 (two) times daily.) 270 tablet 3   insulin NPH-regular Human (NOVOLIN 70/30) (70-30) 100 UNIT/ML injection INJECT 35 UNITS BEFORE BREAKFAST AND 35 UNITS BEFORE SUPPER 45 mL 3   levothyroxine (SYNTHROID) 125 MCG tablet Take 1 tablet (125 mcg total) by mouth daily. 90 tablet 3   NEOMYCIN-POLYMYXIN-HYDROCORTISONE (CORTISPORIN) 1 % SOLN OTIC solution Apply 1-2 drops to nail once daily after shower 10 mL 0   nitroGLYCERIN (NITROSTAT) 0.4 MG SL tablet Place 1 tablet (0.4 mg total) under the tongue every 5 (five) minutes as needed for chest pain. 25 tablet 0   olmesartan (BENICAR) 40 MG tablet Take 40 mg by mouth daily.     potassium chloride SA (KLOR-CON) 20 MEQ tablet TAKE ONE (1) TABLET BY MOUTH EVERY DAY 90 tablet 1   rOPINIRole (REQUIP)  0.5 MG tablet Take 0.5 mg by mouth at bedtime.   0   No current facility-administered medications on file prior to visit.   Allergies  Allergen Reactions   Penicillins Hives    Has patient had a PCN reaction causing immediate rash, facial/tongue/throat swelling, SOB or lightheadedness with hypotension: Yes Has patient had a PCN reaction causing severe rash involving mucus membranes or skin necrosis: No Has patient had a PCN reaction that required hospitalization No Has patient had a PCN reaction occurring within the last 10 years: No If all of the above answers are "NO", then may proceed with Cephalosporin use.   Percocet [Oxycodone-Acetaminophen] Nausea And Vomiting     Objective: General: Patient is awake, alert, and oriented x 3 and in no acute distress.  Integument: Skin is warm, dry and supple bilateral. Nails are tender, long, thickened and dystrophic with subungual debris, consistent with onychomycosis, 1-5 bilateral. Mild curvature of bilateral hallux nails.  No pain with trimming this visit.  No acute signs of infection. No open lesions or preulcerative  lesions present bilateral. Remaining integument unremarkable.  Vasculature:  Dorsalis Pedis pulse 1/4 bilateral. Posterior Tibial pulse  0/4 bilateral.  Capillary fill time <3 sec 1-5 bilateral. Positive hair growth to the level of the digits. Temperature gradient within normal limits. No varicosities present bilateral. Trace edema present bilateral.   Neurology: Gross sensation present via light touch bilateral.  Protective sensation intact with Semmes Weinstein monofilament bilateral.  Musculoskeletal: Asymptomatic bunion and hammertoe with cross over with hammertoe with the right second toe most elevated.  Strength within normal limits.  No other acute findings or tenderness to palpation bilateral.  Assessment and Plan: Problem List Items Addressed This Visit   None Visit Diagnoses     Pain due to onychomycosis of  toenails of both feet    -  Primary   Diabetes mellitus without complication (HCC)       Anticoagulated       Bunion       Hammer toe, unspecified laterality          -Examined patient. -Mechanically debrided all nails 1-5 bilateral using sterile nail nipper and filed with dremel without incident  -Continue with Corticosporin solution as needed to bilateral hallux nails if there is any soreness -Advised good supportive shoes daily for foot type and dispensed a hammertoe regulator pad for patient to use on the right second toe to prevent rubbing when in shoe -Answered all patient questions -Patient to return  in 2-1/2-3 months for at risk foot care -Patient advised to call the office if any problems or questions arise in the meantime.  Landis Martins, DPM

## 2021-05-05 NOTE — Progress Notes (Signed)
PATIENT: Virginia Huber DOB: [birth year]  REASON FOR VISIT: follow up HISTORY FROM: patient  Chief Complaint  Patient presents with   Obstructive Sleep Apnea    Rm 1, alone. Here for yearly CPAP f/u. Pt reports not using her CPAP for 2 weeks in October due to eye surgery and having a big shield around her eye.       HISTORY OF PRESENT ILLNESS:  05/06/21 ALL:  Virginia Huber is a 75 y.o. female here today for follow up for OSA on CPAP. She is doing well on therapy. She does feel more refreshed when using CPAP. She continues to follow closely with cardiology and ophthalmology. She is s/p eye surgery and was unable to use CPAP for about 2 weeks in October. Tremor is stable. Seems to be worse with activity. Both hands effected. Neuropathy is stable. She is followed closely by PCP.      HISTORY: (copied from Dr Dohmeier's previous note)  HPI:  Virginia Huber is a 75 y.o. female patient , seen in a RV after sleep study and for CPAP compliance. CC: Loud snoring, dependent odema, atrial fib, CHF, witnessed apnea, fatigue, TIA.  She reports her family is doing well, nobody has fallen sick during this pandemic. Her grandson got infected as an EMT, recovered well. Her short term memory has been slowly declining and she has trouble to fall asleep and stay asleep. Her sleep study was performed elsewhere on 04-12-2016. She has been doing exquisitely well with her CPAP use 97% compliance by time and by days with an average of 8 hours and 12 minutes her CPAP is set for 10 cm pressure with 3 cm EPR and is now 75 years old.  Her residual AHI is low at 1.4 apneas per hour and she has very minimal air leakage.  This means that her mask fits well and that her apnea is well controlled I would not have to do any adjustments to that.  Any new mask or supplies go through her DME [employer].  The patient also endorsed only 2 points on the Epworth Sleepiness Scale at 18 points on the fatigue severity  scale, and 1 point out of 15 on the geriatric depression score.   She is fully vaccinated against Covid.  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  She was taken recently of Metformin for at the beginning of the year and since then had actually more trouble controlling her blood sugar and her blood pressure today.  The step to discontinue Metformin was meant to put protect her kidney function. She is finally seen by Renato Shin, MD - endocrinologist.     The patient continues to be a highly compliant CPAP user she has used her machine 28 over the last 30 days ending on 05 February 2019.  93% compliance for time and days with an average user time of 7 hours 27 minutes, she usually sleeps 8 hours or uses the machine 8 hours at night.  The pressure is set at 10 cmH2O her machine is provided by [employer].  She has an expiratory pressure relief setting of 3 cm and uses she has been using a full facemask with very little air leakage the 95th percentile is 1.2 L/min and a residual AHI is 1.0.  However she reports difficulties with falling asleep, staying asleep and with the mask fit by now.  She feels that she would be ready for change maybe trying a nasal mask or  nasal cradle. Sometimes she drools at night.  Trazodone- 25 mg , a half tablet, she can increase to a full if needed. I will order a nasal mask, such as the wisp.      REVIEW OF SYSTEMS: Out of a complete 14 system review of symptoms, the patient complains only of the following symptoms, neuropathy, tremor, and all other reviewed systems are negative.  ESS: 6  ALLERGIES: Allergies  Allergen Reactions   Penicillins Hives    Has patient had a PCN reaction causing immediate rash, facial/tongue/throat swelling, SOB or lightheadedness with hypotension: Yes Has patient had a PCN reaction causing severe rash involving mucus membranes or skin necrosis: No Has patient had a PCN reaction that required hospitalization No Has  patient had a PCN reaction occurring within the last 10 years: No If all of the above answers are "NO", then may proceed with Cephalosporin use.   Percocet [Oxycodone-Acetaminophen] Nausea And Vomiting    HOME MEDICATIONS: Outpatient Medications Prior to Visit  Medication Sig Dispense Refill   acetaminophen (TYLENOL) 325 MG tablet Take 650 mg by mouth every 6 (six) hours as needed for headache.     amiodarone (PACERONE) 200 MG tablet Take 0.5 tablets (100 mg total) by mouth daily. 45 tablet 1   apixaban (ELIQUIS) 5 MG TABS tablet Take 1 tablet (5 mg total) by mouth 2 (two) times daily. 28 tablet 0   atorvastatin (LIPITOR) 80 MG tablet TAKE ONE TABLET BY MOUTH EVERY DAY AT 6:00PM 90 tablet 3   Cholecalciferol (VITAMIN D3 PO) Take 1 tablet by mouth daily.      clopidogrel (PLAVIX) 75 MG tablet TAKE ONE TABLET (75MG  TOTAL) BY MOUTH DAILY 90 tablet 3   Continuous Blood Gluc Receiver (FREESTYLE LIBRE 2 READER) DEVI 1 each by Does not apply route daily. 1 each 0   Continuous Blood Gluc Sensor (FREESTYLE LIBRE 2 SENSOR) MISC 1 each by Does not apply route every 14 (fourteen) days. 6 each 3   cyanocobalamin (,VITAMIN B-12,) 1000 MCG/ML injection Inject 1,000 mcg into the muscle every 30 (thirty) days. Last injection was around 08/02/19     diltiazem (CARDIZEM CD) 360 MG 24 hr capsule Take 1 capsule (360 mg total) by mouth daily. 90 capsule 3   Dulaglutide (TRULICITY) 3 WU/9.8JX SOPN Inject 3 mg into the skin once a week. 6 mL 3   FREESTYLE LITE test strip 1 each by Other route 2 (two) times daily.      furosemide (LASIX) 40 MG tablet TAKE ONE AND HALF TABLET (60MG  TOTAL) BY MOUTH IN THE MORNING AND ONE TABLET IN THE EVENING (40MG  TOTAL) 180 tablet 4   insulin NPH-regular Human (NOVOLIN 70/30) (70-30) 100 UNIT/ML injection INJECT 35 UNITS BEFORE BREAKFAST AND 35 UNITS BEFORE SUPPER 45 mL 3   levothyroxine (SYNTHROID) 125 MCG tablet Take 1 tablet (125 mcg total) by mouth daily. 90 tablet 3    NEOMYCIN-POLYMYXIN-HYDROCORTISONE (CORTISPORIN) 1 % SOLN OTIC solution Apply 1-2 drops to nail once daily after shower 10 mL 0   nitroGLYCERIN (NITROSTAT) 0.4 MG SL tablet Place 1 tablet (0.4 mg total) under the tongue every 5 (five) minutes as needed for chest pain. 25 tablet 0   olmesartan (BENICAR) 40 MG tablet Take 40 mg by mouth daily.     potassium chloride SA (KLOR-CON) 20 MEQ tablet TAKE ONE (1) TABLET BY MOUTH EVERY DAY 90 tablet 1   rOPINIRole (REQUIP) 0.5 MG tablet Take 0.5 mg by mouth at bedtime.   0  carvedilol (COREG) 12.5 MG tablet Take 1 tablet (12.5 mg total) by mouth 2 (two) times daily. (Patient taking differently: Take 12.5 mg by mouth 2 (two) times daily. 1/2 tablet 2 (two) times daily) 180 tablet 3   hydrALAZINE (APRESOLINE) 50 MG tablet Take 1 tablet (50 mg total) by mouth 3 (three) times daily. (Patient taking differently: Take 50 mg by mouth 2 (two) times daily.) 270 tablet 3   No facility-administered medications prior to visit.    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 h/o PAF/flutter during admission in 2013 for PNA. b. PAF during adm for NSTEMI 07/2015, subsequent paroxysms since then.   Atrial fibrillation with rapid ventricular response (HCC)    B12 deficiency anemia    Bradycardia 06/14/2016   Branch retinal vein occlusion with macular edema of right eye 10/11/2019   Bronchospasm 03/02/2012   CAD in native artery 11/30/2014   Chronic renal failure, stage 3b ([place]) 09/23/2017    Coronary artery disease 11/30/2014   a. remote MI. b. h/o PTCA with scoring balloon to OM1 11/2014. c. NSTEMI 03/2015 s/p DES to prox-mid Cx. d. NSTEMI 07/2015 s/p scoring balloon/PTCA/DES to dRCA with PAF during that admission   Coronary artery disease involving coronary bypass graft of native heart with unstable angina pectoris ([place])    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    Cutaneous lupus erythematosus    Early stage nonexudative age-related macular degeneration of right eye 02/27/2020   Essential hypertension    FRACTURE, TOE 12/06/2007   Qualifier: Diagnosis of  By: Aline Brochure MD, Dorothyann Peng     GERD (gastroesophageal reflux disease)    History of blood transfusion 1980's   2nd surgical procedures   Hypercholesteremia    Hypokalemia 03/05/2012   Hypothyroidism    Multiple and bilateral precerebral artery syndromes 05/13/2016   Myocardial infarction ([place]) 02/2012   NSTEMI (non-ST elevated myocardial infarction) ([place]) 04/02/2015   OSA (obstructive sleep apnea) 05/13/2016   Ovarian tumor    PAD (peripheral artery disease) ([place])    a. s/p LE angio 2015; followed by Dr. Fletcher Anon - managed medically.   Pain with urination 05/08/2015   Paroxysmal atrial fibrillation ([place]) 12/08/2017   Pericardial effusion    a. 06/2016 after ppm - s/p pericardiocentesis.   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 s/p Medtronic PPM 06/2016, c/b lead perf/pericardial effusion.   Tamponade    TIA (transient ischemic attack)    Type 2  diabetes with nephropathy ([place]) 02/29/2012   Type II diabetes mellitus (HCC)    Typical atrial  flutter ([place])    UTI (urinary tract infection) 05/08/2013    PAST SURGICAL HISTORY: Past Surgical History:  Procedure Laterality Date   ABDOMINAL AORTAGRAM N/A 01/03/2014   Procedure: ABDOMINAL [name];  Surgeon: Wellington Hampshire, MD;  Location: [facility] CATH [facility];  Service: Cardiovascular;  Laterality: N/A;   ABDOMINAL HYSTERECTOMY  1972   "partial"   APPENDECTOMY  1970's   CARDIAC CATHETERIZATION  2008   Tiny OM-2 with 90% narrowing. Med tx.   CARDIAC CATHETERIZATION N/A 11/30/2014   Procedure: Left Heart Cath and Coronary Angiography;  Surgeon: Troy Sine, MD; LAD 20%, CFX 50%, OM1 95%, right PLB 30%, LV normal    CARDIAC CATHETERIZATION N/A 11/30/2014   Procedure: Coronary Balloon Angioplasty;  Surgeon: Troy Sine, MD;  Angiosculpt scoring balloon and PTCA to the OM1 reducing stenosis from 95% to less than 10%   CARDIAC CATHETERIZATION N/A 04/03/2015   Procedure: Left Heart Cath and Coronary Angiography;  Surgeon: Jolaine Artist, MD; dLAD 50%, CFX 90%, OM1 100%, PLA 15%, LVEDP 13     CARDIAC CATHETERIZATION N/A 04/03/2015   Procedure: Coronary Stent Intervention;  Surgeon: Sherren Mocha, MD; 3.0x18 mm Xience DES to the CFX     CARDIAC CATHETERIZATION N/A 08/02/2015   Procedure: Left Heart Cath and Coronary Angiography;  Surgeon: Troy Sine, MD;  Location: [facility];  Service: Cardiovascular;  Laterality: N/A;   CARDIAC CATHETERIZATION N/A 08/02/2015   Procedure: Coronary Stent Intervention;  Surgeon: Troy Sine, MD;  Location: [facility];  Service: Cardiovascular;  Laterality: N/A;   CARDIAC CATHETERIZATION N/A 06/25/2016   Procedure: Pericardiocentesis;  Surgeon: Will Meredith Leeds, MD;  Location: [facility];  Service: Cardiovascular;  Laterality: N/A;   cardiac stents     CARDIOVERSION N/A 12/15/2017   Procedure: CARDIOVERSION;  Surgeon: Acie Fredrickson Wonda Cheng, MD;  [facility];  Service: Cardiovascular;  Laterality: N/A;   CHOLECYSTECTOMY OPEN  1990's   COLONOSCOPY  2005   Dr. Laural Golden: pancolonic divericula, polyp, path unknown currently   COLONOSCOPY  2012   Dr. Oneida Alar: Normal TI, scattered diverticula in entire colon, small internal hemorrhoids, normal colon biopsies. Colonoscopy in 5-10 years.    COLOSTOMY  05/1979   COLOSTOMY REVERSAL  11/1979   EP IMPLANTABLE DEVICE N/A 06/25/2016   Procedure: Lead Revision/Repair;  Surgeon: Will Meredith Leeds, MD;  Location: [facility] CV LAB;  Service: Cardiovascular;  Laterality: N/A;   EP IMPLANTABLE DEVICE N/A 06/25/2016   Procedure: Pacemaker Implant;  Surgeon: Will Meredith Leeds, MD;  Location: [facility];  Service: Cardiovascular;  Laterality: N/A;   EXCISIONAL HEMORRHOIDECTOMY  1970's   EYE SURGERY Left 2000   "branch vein occlusion"   EYE SURGERY Left ~ 2001   "smoothed out wrinkle"   LEFT OOPHORECTOMY  05/1979   nicked bowel, peritonitis, colostomy; colostomy reversed 1981    LOWER EXTREMITY ANGIOGRAM N/A 01/03/2014   Procedure: LOWER EXTREMITY ANGIOGRAM;  Surgeon: Wellington Hampshire, MD;  Location: [facility] CATH LAB;  Service: Cardiovascular;  Laterality: N/A;   Nuclear med stress test  10/2011   Small area of mild ischemia inferoapically.   PARTIAL HYSTERECTOMY  1970's   left ovaries, then ovaries removed later due tumors    RIGHT OOPHORECTOMY  1970's    FAMILY HISTORY: Family History  Problem Relation Age of Onset   Heart disease Mother        deceased   Heart disease Father  deceased, heart disease   Diabetes Brother    Heart disease Brother    Thyroid disease Brother    Heart disease Sister    Heart disease Brother    Thyroid disease Brother    Lupus Daughter    Colon cancer Neg Hx     SOCIAL HISTORY: Social History   Socioeconomic History   Marital status: Married    Spouse name: Not on file   Number of children: Not on file   Years of education: Not  on file   Highest education level: Not on file  Occupational History   Occupation: Retired    Fish farm manager: RETIRED    Comment: insurance billing  Tobacco Use   Smoking status: Never   Smokeless tobacco: Never   Tobacco comments:    Never smoked  Vaping Use   Vaping Use: Never used  Substance and Sexual Activity   Alcohol use: No    Alcohol/week: 0.0 standard drinks   Drug use: No   Sexual activity: Never    Birth control/protection: Surgical    Comment: hyst  Other Topics Concern   Not on file  Social History Narrative   Not on file   Social Determinants of Health   Financial Resource Strain: Not on file  Food Insecurity: Not on file  Transportation Needs: Not on file  Physical Activity: Not on file  Stress: Not on file  Social Connections: Not on file  Intimate Partner Violence: Not on file     PHYSICAL EXAM  Vitals:   05/06/21 1253  BP: 123/68  Pulse: 87  Weight: 152 lb (68.9 kg)  Height: 5\' 3"  (1.6 m)   Body mass index is 26.93 kg/m.  Generalized: Well developed, in no acute distress  Cardiology: normal rate and rhythm, no murmur noted Respiratory: clear to auscultation bilaterally  Neurological examination  Mentation: Alert oriented to time, place, history taking. Follows all commands speech and language fluent Cranial nerve II-XII: Pupils were equal round reactive to light. Extraocular movements were full, visual field were full  Motor: The motor testing reveals 5 over 5 strength of all 4 extremities. Good symmetric motor tone is noted throughout. No tremor noted  Gait and station: Gait is normal.    DIAGNOSTIC DATA (LABS, IMAGING, TESTING) - I reviewed patient records, labs, notes, testing and imaging myself where available.  No flowsheet data found.   Lab Results  Component Value Date   WBC 8.5 08/22/2020   HGB 12.4 08/22/2020   HCT 37.7 08/22/2020   MCV 93 08/22/2020   PLT 265 08/22/2020      Component Value Date/Time   NA 140 08/22/2020  1647   K 4.0 08/22/2020 1647   CL 103 08/22/2020 1647   CO2 20 08/22/2020 1647   GLUCOSE 196 (H) 08/22/2020 1647   GLUCOSE 225 (H) 08/26/2019 0901   BUN 30 (H) 08/22/2020 1647   CREATININE 1.79 (H) 08/22/2020 1647   CREATININE 1.11 (H) 12/31/2017 0850   CALCIUM 9.2 08/22/2020 1647   PROT 6.4 02/25/2021 1638   ALBUMIN 4.4 02/25/2021 1638   AST 6 02/25/2021 1638   ALT 15 02/25/2021 1638   ALKPHOS 118 02/25/2021 1638   BILITOT 0.3 02/25/2021 1638   GFRNONAA 33 (L) 08/26/2019 0901   GFRNONAA 43 (L) 08/05/2017 0807   GFRAA 39 (L) 08/26/2019 0901   GFRAA 50 (L) 08/05/2017 0807   Lab Results  Component Value Date   CHOL 166 08/05/2017   HDL 43 (L) 08/05/2017  [place] 96 08/05/2017   TRIG 160 (H) 08/05/2017   CHOLHDL 3.9 08/05/2017   Lab Results  Component Value Date   HGBA1C 7.7 (A) 02/04/2021   Lab Results  Component Value Date   ZOXWRUEA54 098 (L) 03/02/2012   Lab Results  Component Value Date   TSH 0.40 02/04/2021     ASSESSMENT AND PLAN 75 y.o. 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[place]) (12/08/2017), Arthritis, Atrial fibrillation and flutter ([place]), Atrial fibrillation with rapid ventricular response ([place]), B12 deficiency anemia, Bradycardia (06/14/2016), Branch retinal vein occlusion with macular edema of right eye (10/11/2019), Bronchospasm (03/02/2012), CAD in native artery (11/30/2014), Chronic renal failure, stage 3b ([place]) (09/23/2017), Coronary artery disease (11/30/2014), Coronary artery disease involving coronary bypass graft of native heart with unstable angina pectoris ([place]), Coronary artery disease  involving native coronary artery with other forms of angina pectoris, Cutaneous lupus erythematosus, Early stage nonexudative age-related macular degeneration of right eye (02/27/2020), Essential hypertension, FRACTURE, TOE (12/06/2007), GERD (gastroesophageal reflux disease), History of blood transfusion (1980's), Hypercholesteremia, Hypokalemia (03/05/2012), Hypothyroidism, Multiple and bilateral precerebral artery syndromes (05/13/2016), Myocardial infarction ([place]) (02/2012), NSTEMI (non-ST elevated myocardial infarction) ([place]) (04/02/2015), OSA (obstructive sleep apnea) (05/13/2016), Ovarian tumor, PAD (peripheral artery disease) ([place]), Pain with urination (05/08/2015), Paroxysmal atrial fibrillation ([place]) (12/08/2017), Pericardial effusion, Posterior vitreous detachment of right eye (10/11/2019), Retinal microaneurysm of right eye (10/11/2019), Right retinal defect (10/11/2019), RLQ abdominal pain (11/24/2010), S/P pericardiocentesis (06/28/2016), 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 here with     ICD-10-CM   1. OSA on CPAP  G47.33 For home use only DME continuous positive airway pressure (CPAP)   Z99.89     2. 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 Compliance report reveals acceptable compliance despite being unable to use for 2 weeks due to ophthalmic surgery. She was encouraged to continue using CPAP nightly and for greater than 4 hours each night. We will update supply orders as indicated. Risks of untreated sleep apnea review and education  materials provided. She will continue to follow up with care team. Tremor and neuropathy are stable. Healthy lifestyle habits encouraged. She will follow up in 1 year, sooner if needed. She verbalizes understanding and agreement with this plan.    Orders Placed This Encounter  Procedures   For home use only DME continuous positive airway pressure (CPAP)    Supplies    Order Specific Question:   Length of Need    Answer:   Lifetime    Order Specific Question:   Patient has OSA or probable OSA    Answer:   Yes    Order Specific Question:   Is the patient currently using CPAP in the home    Answer:   Yes    Order Specific Question:   Settings    Answer:   Other see  comments    Order Specific Question:   CPAP supplies needed    Answer:   Mask, headgear, cushions, filters, heated tubing and water chamber      No orders of the defined types were placed in this encounter.     Debbora Presto, FNP-C 05/06/2021, 1:16 PM [facility] [address] [place], [place] [address] [phone]

## 2021-05-05 NOTE — Patient Instructions (Addendum)
Please continue using your CPAP regularly. While your insurance requires that you use CPAP at least 4 hours each night on 70% of the nights, I recommend, that you not skip any nights and use it throughout the night if you can. Getting used to CPAP and staying with the treatment long term does take time and patience and discipline. Untreated obstructive sleep apnea when it is moderate to severe can have an adverse impact on cardiovascular health and raise her risk for heart disease, arrhythmias, hypertension, congestive heart failure, stroke and diabetes. Untreated obstructive sleep apnea causes sleep disruption, nonrestorative sleep, and sleep deprivation. This can have an impact on your day to day functioning and cause daytime sleepiness and impairment of cognitive function, memory loss, mood disturbance, and problems focussing. Using CPAP regularly can improve these symptoms.  Continue to monitor tremor  Follow up in 1 year

## 2021-05-06 ENCOUNTER — Ambulatory Visit: Payer: HMO | Admitting: Family Medicine

## 2021-05-06 ENCOUNTER — Encounter: Payer: Self-pay | Admitting: Family Medicine

## 2021-05-06 VITALS — BP 123/68 | HR 87 | Ht 63.0 in | Wt 152.0 lb

## 2021-05-06 DIAGNOSIS — G4733 Obstructive sleep apnea (adult) (pediatric): Secondary | ICD-10-CM

## 2021-05-06 DIAGNOSIS — Z9989 Dependence on other enabling machines and devices: Secondary | ICD-10-CM

## 2021-05-06 DIAGNOSIS — G251 Drug-induced tremor: Secondary | ICD-10-CM

## 2021-05-07 ENCOUNTER — Ambulatory Visit (INDEPENDENT_AMBULATORY_CARE_PROVIDER_SITE_OTHER): Payer: HMO | Admitting: Ophthalmology

## 2021-05-07 ENCOUNTER — Encounter (INDEPENDENT_AMBULATORY_CARE_PROVIDER_SITE_OTHER): Payer: Self-pay | Admitting: Ophthalmology

## 2021-05-07 ENCOUNTER — Other Ambulatory Visit: Payer: Self-pay

## 2021-05-07 ENCOUNTER — Encounter (INDEPENDENT_AMBULATORY_CARE_PROVIDER_SITE_OTHER): Payer: HMO | Admitting: Ophthalmology

## 2021-05-07 DIAGNOSIS — H43311 Vitreous membranes and strands, right eye: Secondary | ICD-10-CM

## 2021-05-07 DIAGNOSIS — H34831 Tributary (branch) retinal vein occlusion, right eye, with macular edema: Secondary | ICD-10-CM

## 2021-05-07 MED ORDER — BEVACIZUMAB 2.5 MG/0.1ML IZ SOSY
2.5000 mg | PREFILLED_SYRINGE | INTRAVITREAL | Status: AC | PRN
Start: 2021-05-07 — End: 2021-05-07
  Administered 2021-05-07: 2.5 mg via INTRAVITREAL

## 2021-05-07 NOTE — Assessment & Plan Note (Signed)
1 month post vitrectomy for membrane strands and floaters vitreous debris hampering acuity, patient very happy that this issue has resolved post vitrectomy

## 2021-05-07 NOTE — Assessment & Plan Note (Signed)
At 5-week interval today, CME has recurred, I explained the patient is likely basis of the vitreous washout that occurs with vitrectomy which occurred only couple weeks after her most recent injection we will repeat injection Avastin today to reestablish control of CME and improve acuity

## 2021-05-07 NOTE — Progress Notes (Signed)
05/07/2021     CHIEF COMPLAINT Patient presents for  Chief Complaint  Patient presents with   Retina Follow Up      HISTORY OF PRESENT ILLNESS: Virginia Huber is a 75 y.o. female who presents to the clinic today for:   HPI     Retina Follow Up   Patient presents with  CRVO/BRVO.  In right eye.  This started 3 weeks ago.  Duration of 3 weeks.  Since onset it is gradually worsening.        Comments   3 week f/u OD with OCT and possible Avastin injection.  Pt states the vision has seemed a bit worse in the right eye for the past few days.      Last edited by Reather Littler, COA on 05/07/2021 10:02 AM.      Referring physician: Sharilyn Sites, MD [address] [place],  [place] [address]  HISTORICAL INFORMATION:   Selected notes from the MEDICAL RECORD NUMBER    Lab Results  Component Value Date   HGBA1C 7.7 (A) 02/04/2021     CURRENT MEDICATIONS: No current outpatient medications on file. (Ophthalmic Drugs)   No current facility-administered medications for this visit. (Ophthalmic Drugs)   Current Outpatient Medications (Other)  Medication Sig   acetaminophen (TYLENOL) 325 MG tablet Take 650 mg by mouth every 6 (six) hours as needed for headache.   amiodarone (PACERONE) 200 MG tablet Take 0.5 tablets (100 mg total) by mouth daily.   apixaban (ELIQUIS) 5 MG TABS tablet Take 1 tablet (5 mg total) by mouth 2 (two) times daily.   atorvastatin (LIPITOR) 80 MG tablet TAKE ONE TABLET BY MOUTH EVERY DAY AT 6:00PM   carvedilol (COREG) 12.5 MG tablet Take 1 tablet (12.5 mg total) by mouth 2 (two) times daily. (Patient taking differently: Take 12.5 mg by mouth 2 (two) times daily. 1/2 tablet 2 (two) times daily)   Cholecalciferol (VITAMIN D3 PO) Take 1 tablet by mouth daily.    clopidogrel (PLAVIX) 75 MG tablet TAKE ONE TABLET (75MG  TOTAL) BY MOUTH DAILY   Continuous Blood Gluc Receiver (FREESTYLE LIBRE 2 READER) DEVI 1 each by Does not apply route daily.    Continuous Blood Gluc Sensor (FREESTYLE LIBRE 2 SENSOR) MISC 1 each by Does not apply route every 14 (fourteen) days.   cyanocobalamin (,VITAMIN B-12,) 1000 MCG/ML injection Inject 1,000 mcg into the muscle every 30 (thirty) days. Last injection was around 08/02/19   diltiazem (CARDIZEM CD) 360 MG 24 hr capsule Take 1 capsule (360 mg total) by mouth daily.   Dulaglutide (TRULICITY) 3 KZ/6.0FU SOPN Inject 3 mg into the skin once a week.   FREESTYLE LITE test strip 1 each by Other route 2 (two) times daily.    furosemide (LASIX) 40 MG tablet TAKE ONE AND HALF TABLET (60MG  TOTAL) BY MOUTH IN THE MORNING AND ONE TABLET IN THE EVENING (40MG  TOTAL)   hydrALAZINE (APRESOLINE) 50 MG tablet Take 1 tablet (50 mg total) by mouth 3 (three) times daily. (Patient taking differently: Take 50 mg by mouth 2 (two) times daily.)   insulin NPH-regular Human (NOVOLIN 70/30) (70-30) 100 UNIT/ML injection INJECT 35 UNITS BEFORE BREAKFAST AND 35 UNITS BEFORE SUPPER   levothyroxine (SYNTHROID) 125 MCG tablet Take 1 tablet (125 mcg total) by mouth daily.   NEOMYCIN-POLYMYXIN-HYDROCORTISONE (CORTISPORIN) 1 % SOLN OTIC solution Apply 1-2 drops to nail once daily after shower   nitroGLYCERIN (NITROSTAT) 0.4 MG SL tablet Place 1 tablet (0.4 mg total)  under the tongue every 5 (five) minutes as needed for chest pain.   olmesartan (BENICAR) 40 MG tablet Take 40 mg by mouth daily.   potassium chloride SA (KLOR-CON) 20 MEQ tablet TAKE ONE (1) TABLET BY MOUTH EVERY DAY   rOPINIRole (REQUIP) 0.5 MG tablet Take 0.5 mg by mouth at bedtime.    No current facility-administered medications for this visit. (Other)      REVIEW OF SYSTEMS:    ALLERGIES Allergies  Allergen Reactions   Penicillins Hives    Has patient had a PCN reaction causing immediate rash, facial/tongue/throat swelling, SOB or lightheadedness with hypotension: Yes Has patient had a PCN reaction causing severe rash involving mucus membranes or skin necrosis:  No Has patient had a PCN reaction that required hospitalization No Has patient had a PCN reaction occurring within the last 10 years: No If all of the above answers are "NO", then may proceed with Cephalosporin use.   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 h/o PAF/flutter during admission in 2013 for PNA. b. PAF during adm for NSTEMI 07/2015, subsequent paroxysms since then.   Atrial fibrillation with rapid ventricular response (HCC)    B12 deficiency anemia    Bradycardia 06/14/2016   Branch retinal vein occlusion with macular edema of right eye 10/11/2019   Bronchospasm 03/02/2012   CAD in native artery 11/30/2014   Chronic renal failure, stage 3b ([place]) 09/23/2017   Coronary artery disease 11/30/2014   a. remote MI. b. h/o PTCA with scoring balloon to OM1 11/2014. c. NSTEMI 03/2015 s/p DES to prox-mid Cx. d. 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 s/p LE angio 2015; followed by Dr. Fletcher Anon - managed medically.   Pain with urination 05/08/2015   Paroxysmal atrial fibrillation ([place]) 12/08/2017   Pericardial effusion    a. 06/2016 after ppm - s/p pericardiocentesis.   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 s/p Medtronic PPM 06/2016, c/b lead perf/pericardial effusion.   Tamponade    TIA (transient ischemic attack)    Type 2 diabetes with nephropathy ([place]) 02/29/2012   Type II diabetes mellitus (HCC)    Typical atrial flutter (HCC)    UTI (urinary tract infection) 05/08/2013   Past Surgical History:  Procedure Laterality Date   ABDOMINAL AORTAGRAM N/A 01/03/2014   Procedure: ABDOMINAL [name];  Surgeon: Wellington Hampshire, MD;  Location: [facility] CATH [facility];  Service: Cardiovascular;  Laterality: N/A;   ABDOMINAL HYSTERECTOMY  1972   "partial"   APPENDECTOMY  1970's   CARDIAC CATHETERIZATION  2008   Tiny OM-2 with 90% narrowing. Med tx.   CARDIAC CATHETERIZATION N/A 11/30/2014   Procedure: Left Heart Cath and Coronary Angiography;  Surgeon: Troy Sine, MD; LAD 20%, CFX 50%, OM1 95%, right PLB 30%, LV normal     CARDIAC CATHETERIZATION N/A 11/30/2014   Procedure: Coronary Balloon Angioplasty;  Surgeon: Troy Sine, MD;  Angiosculpt scoring balloon and PTCA to the OM1 reducing stenosis from 95% to less than 10%   CARDIAC CATHETERIZATION N/A 04/03/2015   Procedure: Left Heart Cath and Coronary Angiography;  Surgeon: Jolaine Artist, MD; dLAD 50%, CFX 90%, OM1 100%, PLA 15%, LVEDP 13     CARDIAC CATHETERIZATION N/A 04/03/2015   Procedure: Coronary Stent Intervention;  Surgeon: Sherren Mocha, MD; 3.0x18 mm Xience DES to the CFX     CARDIAC CATHETERIZATION N/A 08/02/2015   Procedure: Left Heart Cath and Coronary Angiography;  Surgeon: Troy Sine, MD;  Location: [facility] CV LAB;  Service: Cardiovascular;  Laterality: N/A;   CARDIAC CATHETERIZATION N/A 08/02/2015   Procedure: Coronary Stent Intervention;  Surgeon: Troy Sine, MD;  Location: [facility] LAB;  Service: Cardiovascular;  Laterality: N/A;   CARDIAC CATHETERIZATION N/A 06/25/2016   Procedure: Pericardiocentesis;  Surgeon: Will Meredith Leeds, MD;  Location: [facility] CV LAB;  Service: Cardiovascular;  Laterality: N/A;   cardiac stents     CARDIOVERSION N/A 12/15/2017   Procedure: CARDIOVERSION;  Surgeon: Acie Fredrickson Wonda Cheng, MD;  Location: [facility] END[facility];  Service: Cardiovascular;  Laterality: N/A;   CHOLECYSTECTOMY OPEN  1990's   COLONOSCOPY  2005   Dr. Laural Golden: pancolonic divericula, polyp, path unknown currently   COLONOSCOPY  2012   Dr. Oneida Alar: Normal TI, scattered diverticula in entire colon, small internal hemorrhoids, normal colon biopsies. Colonoscopy in 5-10 years.    COLOSTOMY  05/1979   COLOSTOMY REVERSAL  11/1979   EP IMPLANTABLE DEVICE N/A 06/25/2016   Procedure: Lead Revision/Repair;  Surgeon: Will Meredith Leeds, MD;  Location: [facility] CV LAB;  Service: Cardiovascular;  Laterality: N/A;   EP IMPLANTABLE DEVICE N/A 06/25/2016   Procedure: Pacemaker Implant;  Surgeon: Will Meredith Leeds, MD;  Location: [facility] CV  LAB;  Service: Cardiovascular;  Laterality: N/A;   EXCISIONAL HEMORRHOIDECTOMY  1970's   EYE SURGERY Left 2000   "branch vein occlusion"   EYE SURGERY Left ~ 2001   "smoothed out wrinkle"   LEFT OOPHORECTOMY  05/1979   nicked bowel, peritonitis, colostomy; colostomy reversed 1981    LOWER EXTREMITY ANGIOGRAM N/A 01/03/2014   Procedure: LOWER EXTREMITY ANGIOGRAM;  Surgeon: Wellington Hampshire, MD;  Location: [facility] CATH LAB;  Service: Cardiovascular;  Laterality: N/A;   Nuclear med stress test  10/2011   Small area of mild ischemia inferoapically.   PARTIAL HYSTERECTOMY  1970's   left ovaries, then ovaries removed later due tumors    RIGHT OOPHORECTOMY  1970's    FAMILY HISTORY Family History  Problem Relation Age of Onset   Heart disease Mother        deceased   Heart disease Father        deceased, heart disease   Diabetes Brother    Heart disease Brother    Thyroid disease Brother    Heart disease Sister    Heart disease Brother    Thyroid disease Brother    Lupus Daughter    Colon cancer Neg Hx     SOCIAL HISTORY Social History   Tobacco Use   Smoking status: Never   Smokeless tobacco: Never   Tobacco comments:    Never smoked  Vaping Use   Vaping Use: Never used  Substance Use Topics   Alcohol use: No    Alcohol/week: 0.0 standard drinks   Drug use: No         OPHTHALMIC EXAM:  Base Eye Exam     Visual Acuity (ETDRS)       Right Left   Dist cc 20/50 CF@1ft    Dist ph cc NI NI    Correction: Glasses         Tonometry (Tonopen, 10:06 AM)       Right Left   Pressure 11 11         Pupils       Pupils Dark Light Shape React APD   Right PERRL 5 4 Round Brisk None   Left PERRL 5 4 Round Brisk None         Visual Fields (Counting fingers)       Left Right    Full Full         Extraocular Movement       Right Left    Full, Ortho Full, Ortho         Neuro/Psych     Oriented x3: Yes   Mood/Affect: Normal         Dilation      Right eye: 1.0% Mydriacyl, 2.5% Phenylephrine @ 10:06 AM           Slit Lamp and Fundus Exam     External Exam       Right Left   External Normal Normal         Slit Lamp Exam       Right Left   Lids/Lashes Normal Normal   Conjunctiva/Sclera White and quiet White and quiet   Cornea Clear Clear   Anterior Chamber Deep and quiet Deep and quiet   Iris Round and reactive Round and reactive   Lens Posterior chamber intraocular lens Posterior chamber intraocular lens   Anterior Vitreous Normal, no [place] Normal         Fundus Exam       Right Left   Posterior Vitreous Clear and avitric, no debris visible    Disc Normal    C/D Ratio 0.2    Macula Hard drusen,   , Microaneurysms, Retinal pigment epithelial mottling, no hemorrhage, no exudates, Cystoid macular edema    Vessels Old branch retinal vein occlusion    Periphery Normal,  , no retinal hole, no retinal tear, good peripheral PRP sector  IMAGING AND PROCEDURES  Imaging and Procedures for 05/07/21  OCT, Retina - OU - Both Eyes       Right Eye Quality was good. Scan locations included subfoveal. Central Foveal Thickness: 540. Progression has worsened. Findings include cystoid macular edema.   Left Eye Quality was good. Scan locations included subfoveal. Central Foveal Thickness: 268. Progression has been stable. Findings include subretinal scarring, disciform scar.   Notes Recurrent CME from BRVO, post vitrectomy as well at 5-week interval.  OS, no active maculopathy, cicatricial scarring Accounts for acuity     Intravitreal Injection, Pharmacologic Agent - OD - Right Eye       Time Out 05/07/2021. 10:52 AM. Confirmed correct patient, procedure, site, and patient consented.   Anesthesia Topical anesthesia was used. Anesthetic medications included Lidocaine 4%.   Procedure Preparation included 10% betadine to eyelids, 5% betadine to ocular surface, Tobramycin 0.3%. A 30 gauge  needle was used.   Injection: 2.5 mg bevacizumab 2.5 MG/0.1ML   Route: Intravitreal, Site: Right Eye   NDC: [phone], Lot: [id number]   Post-op Post injection exam found visual acuity of at least counting fingers. The patient tolerated the procedure well. There were no complications. The patient received written and verbal post procedure care education. Post injection medications included ocuflox.              ASSESSMENT/PLAN:  Vitreous membranes and strands, right 1 month post vitrectomy for membrane strands and floaters vitreous debris hampering acuity, patient very happy that this issue has resolved post vitrectomy  Branch retinal vein occlusion with macular edema of right eye At 5-week interval today, CME has recurred, I explained the patient is likely basis of the vitreous washout that occurs with vitrectomy which occurred only couple weeks after her most recent injection we will repeat injection Avastin today to reestablish control of CME and improve acuity     ICD-10-CM   1. Branch retinal vein occlusion with macular edema of right eye  H34.8310 OCT, Retina - OU - Both Eyes    Intravitreal Injection, Pharmacologic Agent - OD - Right Eye    bevacizumab (AVASTIN) SOSY 2.5 mg    2. Vitreous membranes and strands, right  H43.311       1.  OD patient has discontinued topical medications post vitrectomy some 1 month previous for vitreous membranes and strands hampering her acuity  2.  OD with recurrent CME at 5-week interval post intravitreal Avastin likely in the basis of debulking of the therapeutic medication delivered prior to recent surgery for vitreous membranes and strands  3.  OD repeat injection Avastin today follow-up again in 5 weeks  Ophthalmic Meds Ordered this visit:  Meds ordered this encounter  Medications   bevacizumab (AVASTIN) SOSY 2.5 mg       Return in about 5 weeks (around 06/11/2021) for dilate, OD, AVASTIN OCT.  There are no Patient  Instructions on file for this visit.   Explained the diagnoses, plan, and follow up with the patient and they expressed understanding.  Patient expressed understanding of the importance of proper follow up care.   Clent Demark Alberta Lenhard M.D. Diseases & Surgery of the Retina and Vitreous [facility] [place] 05/07/21     Abbreviations: M myopia (nearsighted); A astigmatism; H hyperopia (farsighted); P presbyopia; Mrx spectacle prescription;  CTL contact lenses; OD right eye; OS left eye; OU both eyes  XT exotropia; ET esotropia; PEK punctate epithelial keratitis; PEE punctate epithelial erosions; DES dry eye syndrome; MGD meibomian  gland dysfunction; ATs artificial tears; PFAT's preservative free artificial tears; [place] nuclear sclerotic cataract; PSC posterior subcapsular cataract; ERM epi-retinal membrane; PVD posterior vitreous detachment; RD retinal detachment; DM diabetes mellitus; DR diabetic retinopathy; NPDR non-proliferative diabetic retinopathy; PDR proliferative diabetic retinopathy; CSME clinically significant macular edema; DME diabetic macular edema; dbh dot blot hemorrhages; CWS cotton wool spot; POAG primary open angle glaucoma; C/D cup-to-disc ratio; HVF humphrey visual field; GVF goldmann visual field; OCT optical coherence tomography; IOP intraocular pressure; BRVO Branch retinal vein occlusion; CRVO central retinal vein occlusion; CRAO central retinal artery occlusion; BRAO branch retinal artery occlusion; RT retinal tear; SB scleral buckle; PPV pars plana vitrectomy; VH Vitreous hemorrhage; PRP panretinal laser photocoagulation; IVK intravitreal kenalog; VMT vitreomacular traction; MH Macular hole;  NVD neovascularization of the disc; NVE neovascularization elsewhere; AREDS age related eye disease study; ARMD age related macular degeneration; POAG primary open angle glaucoma; EBMD epithelial/anterior basement membrane dystrophy; ACIOL anterior chamber intraocular lens; IOL intraocular  lens; PCIOL posterior chamber intraocular lens; Phaco/IOL phacoemulsification with intraocular lens placement; [place] photorefractive keratectomy; LASIK laser assisted in situ keratomileusis; HTN hypertension; DM diabetes mellitus; COPD chronic obstructive pulmonary disease

## 2021-05-13 ENCOUNTER — Ambulatory Visit: Payer: HMO | Admitting: Gastroenterology

## 2021-05-13 ENCOUNTER — Telehealth: Payer: Self-pay

## 2021-05-13 ENCOUNTER — Encounter: Payer: Self-pay | Admitting: Gastroenterology

## 2021-05-13 ENCOUNTER — Other Ambulatory Visit: Payer: Self-pay

## 2021-05-13 DIAGNOSIS — Z86010 Personal history of colonic polyps: Secondary | ICD-10-CM

## 2021-05-13 NOTE — Patient Instructions (Addendum)
I recommend taking Benefiber 2 teaspoons each morning in your beverage of choice. Hopefully, this can help "even" out your bowel habits. If not, please call us.  We are arranging a colonoscopy with Dr. Abbey Chatters in the near future.  We are requesting to stop Plavix 5 days before and Eliquis 2 days before. No insulin the morning of the procedure.  Further recommendations to follow!  Have a great Thanksgiving!

## 2021-05-13 NOTE — Progress Notes (Signed)
Primary Care Physician:  Sharilyn Sites, MD Referring Physician: Delman Cheadle, PA-C/Dr. Gerarda Fraction Primary Gastroenterologist:  Dr. Abbey Chatters  Chief Complaint  Patient presents with   Consult    Due for TCS   change in bowels    Can go 2-3 days without BM and then can have several BM's in a day    HPI:   Virginia Huber is a 75 y.o. female presenting today at the request of Delman Cheadle, PA-C  for colonoscopy. Remote history of colon polyp in 2005. Last colonoscopy by Dr. Oneida Alar in 2012 with normal TI and normal colonic biospies, recommended in 5-10 years. She is on Eliquis with history of afib. Prior stents in 2016 and 2017 on Plavix. Here to discuss surveillance colonoscopy.   She takes Tums just as needed for indigestion. Denies any overt GI bleeding. She does note 2-3 days without a BM then will have multiple BMs. She has a long-standing history of RLQ discomfort dating back mayn years, present since at least 2012. No relieving factors. No precipitating factors. No association with bowel movements. This is at baseline.    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 h/o PAF/flutter during admission in 2013 for PNA. b. PAF during adm for NSTEMI 07/2015, subsequent paroxysms since then.   Atrial fibrillation with rapid ventricular response (HCC)    B12 deficiency anemia    Bradycardia 06/14/2016   Branch retinal vein occlusion with macular edema of right eye 10/11/2019   Bronchospasm 03/02/2012   CAD in native artery  11/30/2014   Chronic renal failure, stage 3b ([place]) 09/23/2017   Coronary artery disease 11/30/2014   a. remote MI. b. h/o PTCA with scoring balloon to OM1 11/2014. c. NSTEMI 03/2015 s/p DES to prox-mid Cx. d. 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 s/p LE angio 2015; followed by Dr. Fletcher Anon - managed medically.   Pain with urination 05/08/2015   Paroxysmal atrial fibrillation ([place]) 12/08/2017   Pericardial effusion    a. 06/2016 after ppm - s/p pericardiocentesis.   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 s/p Medtronic PPM 06/2016, c/b lead perf/pericardial effusion.    Tamponade    TIA (transient ischemic attack)    Type 2 diabetes with nephropathy ([place]) 02/29/2012   Type II diabetes mellitus (HCC)    Typical atrial flutter (HCC)    UTI (urinary tract infection) 05/08/2013    Past Surgical History:  Procedure Laterality Date   ABDOMINAL AORTAGRAM N/A 01/03/2014   Procedure: ABDOMINAL [name];  Surgeon: Wellington Hampshire, MD;  Location: [facility]ATH [facility];  Service: Cardiovascular;  Laterality: N/A;   ABDOMINAL HYSTERECTOMY  1972   "partial"   APPENDECTOMY  1970's   CARDIAC CATHETERIZATION  2008   Tiny OM-2 with 90% narrowing. Med tx.   CARDIAC CATHETERIZATION N/A 11/30/2014   Procedure: Left Heart Cath and Coronary Angiography;  Surgeon: Troy Sine, MD; LAD 20%, CFX 50%, OM1 95%, right PLB 30%, LV normal    CARDIAC CATHETERIZATION N/A 11/30/2014   Procedure: Coronary Balloon Angioplasty;  Surgeon: Troy Sine, MD;  Angiosculpt scoring balloon and PTCA to the OM1 reducing stenosis from 95% to less than 10%   CARDIAC CATHETERIZATION N/A 04/03/2015   Procedure: Left Heart Cath and Coronary Angiography;  Surgeon: Jolaine Artist, MD; dLAD 50%, CFX 90%, OM1 100%, PLA 15%, LVEDP 13     CARDIAC CATHETERIZATION N/A 04/03/2015   Procedure: Coronary Stent Intervention;  Surgeon: Sherren Mocha, MD; 3.0x18 mm Xience DES to the CFX     CARDIAC CATHETERIZATION N/A 08/02/2015   Procedure: Left Heart Cath and Coronary Angiography;  Surgeon: Troy Sine, MD;  Location: [facility];  Service: Cardiovascular;  Laterality: N/A;   CARDIAC CATHETERIZATION N/A 08/02/2015   Procedure: Coronary Stent Intervention;  Surgeon: Troy Sine, MD;  Location: [facility];  Service: Cardiovascular;  Laterality: N/A;   CARDIAC CATHETERIZATION N/A 06/25/2016   Procedure: Pericardiocentesis;  Surgeon: Will Meredith Leeds, MD;  Location: [facility];  Service: Cardiovascular;  Laterality: N/A;   cardiac stents     CARDIOVERSION N/A 12/15/2017   Procedure:  CARDIOVERSION;  Surgeon: Acie Fredrickson Wonda Cheng, MD;  Location: [facility];  Service: Cardiovascular;  Laterality: N/A;   CHOLECYSTECTOMY OPEN  1990's   COLONOSCOPY  2005   Dr. Laural Golden: pancolonic divericula, polyp, path unknown currently   COLONOSCOPY  2012   Dr. Oneida Alar: Normal TI, scattered diverticula in entire colon, small internal hemorrhoids, normal colon biopsies. Colonoscopy in 5-10 years.    COLOSTOMY  05/1979   COLOSTOMY REVERSAL  11/1979   EP IMPLANTABLE DEVICE N/A 06/25/2016   Procedure: Lead Revision/Repair;  Surgeon: Will Meredith Leeds, MD;  Location: [facility];  Service: Cardiovascular;  Laterality: N/A;   EP IMPLANTABLE DEVICE N/A 06/25/2016   Procedure: Pacemaker Implant;  Surgeon: Will Meredith Leeds, MD;  Location: [facility];  Service: Cardiovascular;  Laterality: N/A;   EXCISIONAL HEMORRHOIDECTOMY  1970's   EYE SURGERY Left 2000   "branch vein occlusion"   EYE SURGERY Left ~ 2001   "smoothed out wrinkle"   LEFT OOPHORECTOMY  05/1979   nicked bowel, peritonitis, colostomy; colostomy reversed 1981  LOWER EXTREMITY ANGIOGRAM N/A 01/03/2014   Procedure: LOWER EXTREMITY ANGIOGRAM;  Surgeon: Wellington Hampshire, MD;  Location: [facility];  Service: Cardiovascular;  Laterality: N/A;   Nuclear med stress test  10/2011   Small area of mild ischemia inferoapically.   PARTIAL HYSTERECTOMY  1970's   left ovaries, then ovaries removed later due tumors    RIGHT OOPHORECTOMY  1970's    Current Outpatient Medications  Medication Sig Dispense Refill   acetaminophen (TYLENOL) 325 MG tablet Take 650 mg by mouth every 6 (six) hours as needed for headache.     amiodarone (PACERONE) 200 MG tablet Take 0.5 tablets (100 mg total) by mouth daily. 45 tablet 1   apixaban (ELIQUIS) 5 MG TABS tablet Take 1 tablet (5 mg total) by mouth 2 (two) times daily. 28 tablet 0   atorvastatin (LIPITOR) 80 MG tablet TAKE ONE TABLET BY MOUTH EVERY DAY AT 6:00PM 90 tablet 3   carvedilol (COREG)  12.5 MG tablet Take 1 tablet (12.5 mg total) by mouth 2 (two) times daily. (Patient taking differently: Take 12.5 mg by mouth 2 (two) times daily. 1/2 tablet 2 (two) times daily) 180 tablet 3   Cholecalciferol (VITAMIN D3 PO) Take 1 tablet by mouth daily.      clopidogrel (PLAVIX) 75 MG tablet TAKE ONE TABLET (75MG  TOTAL) BY MOUTH DAILY 90 tablet 3   Continuous Blood Gluc Receiver (FREESTYLE LIBRE 2 READER) DEVI 1 each by Does not apply route daily. 1 each 0   Continuous Blood Gluc Sensor (FREESTYLE LIBRE 2 SENSOR) MISC 1 each by Does not apply route every 14 (fourteen) days. 6 each 3   cyanocobalamin (,VITAMIN B-12,) 1000 MCG/ML injection Inject 1,000 mcg into the muscle every 30 (thirty) days. Last injection was around 08/02/19     diltiazem (CARDIZEM CD) 360 MG 24 hr capsule Take 1 capsule (360 mg total) by mouth daily. 90 capsule 3   Dulaglutide (TRULICITY) 3 IZ/1.2WP SOPN Inject 3 mg into the skin once a week. 6 mL 3   FREESTYLE LITE test strip 1 each by Other route 2 (two) times daily.      furosemide (LASIX) 40 MG tablet TAKE ONE AND HALF TABLET (60MG  TOTAL) BY MOUTH IN THE MORNING AND ONE TABLET IN THE EVENING (40MG  TOTAL) 180 tablet 4   hydrALAZINE (APRESOLINE) 50 MG tablet Take 1 tablet (50 mg total) by mouth 3 (three) times daily. (Patient taking differently: Take 50 mg by mouth 2 (two) times daily.) 270 tablet 3   insulin NPH-regular Human (NOVOLIN 70/30) (70-30) 100 UNIT/ML injection INJECT 35 UNITS BEFORE BREAKFAST AND 35 UNITS BEFORE SUPPER 45 mL 3   levothyroxine (SYNTHROID) 125 MCG tablet Take 1 tablet (125 mcg total) by mouth daily. 90 tablet 3   NEOMYCIN-POLYMYXIN-HYDROCORTISONE (CORTISPORIN) 1 % SOLN OTIC solution Apply 1-2 drops to nail once daily after shower 10 mL 0   nitroGLYCERIN (NITROSTAT) 0.4 MG SL tablet Place 1 tablet (0.4 mg total) under the tongue every 5 (five) minutes as needed for chest pain. 25 tablet 0   olmesartan (BENICAR) 40 MG tablet Take 40 mg by mouth daily.      potassium chloride SA (KLOR-CON) 20 MEQ tablet TAKE ONE (1) TABLET BY MOUTH EVERY DAY 90 tablet 1   rOPINIRole (REQUIP) 0.5 MG tablet Take 0.5 mg by mouth at bedtime.   0   No current facility-administered medications for this visit.    Allergies as of 05/13/2021 - Review Complete 05/13/2021  Allergen Reaction Noted  Penicillins Hives    Percocet [oxycodone-acetaminophen] Nausea And Vomiting 02/27/2012    Family History  Problem Relation Age of Onset   Heart disease Mother        deceased   Heart disease Father        deceased, heart disease   Diabetes Brother    Heart disease Brother    Thyroid disease Brother    Heart disease Sister    Heart disease Brother    Thyroid disease Brother    Lupus Daughter    Colon cancer Neg Hx     Social History   Socioeconomic History   Marital status: Married    Spouse name: Not on file   Number of children: Not on file   Years of education: Not on file   Highest education level: Not on file  Occupational History   Occupation: Retired    Fish farm manager: RETIRED    Comment: insurance billing  Tobacco Use   Smoking status: Never   Smokeless tobacco: Never   Tobacco comments:    Never smoked  Vaping Use   Vaping Use: Never used  Substance and Sexual Activity   Alcohol use: No    Alcohol/week: 0.0 standard drinks   Drug use: No   Sexual activity: Never    Birth control/protection: Surgical    Comment: hyst  Other Topics Concern   Not on file  Social History Narrative   Not on file   Social Determinants of Health   Financial Resource Strain: Not on file  Food Insecurity: Not on file  Transportation Needs: Not on file  Physical Activity: Not on file  Stress: Not on file  Social Connections: Not on file  Intimate Partner Violence: Not on file    Review of Systems: Gen: Denies any fever, chills, fatigue, weight loss, lack of appetite.  CV: Denies chest pain, heart palpitations, peripheral edema, syncope.  Resp: Denies  shortness of breath at rest or with exertion. Denies wheezing or cough.  GI: see HPI GU : Denies urinary burning, urinary frequency, urinary hesitancy MS: Denies joint pain, muscle weakness, cramps, or limitation of movement.  Derm: Denies rash, itching, dry skin Psych: Denies depression, anxiety, memory loss, and confusion Heme: Denies bruising, bleeding, and enlarged lymph nodes.  Physical Exam: BP (!) 149/78   Pulse 81   Temp (!) 96.6 F (35.9 C)   Ht 5\' 3"  (1.6 m)   Wt 152 lb 3.2 oz (69 kg)   BMI 26.96 kg/m  General:   Alert and oriented. Pleasant and cooperative. Well-nourished and well-developed.  Head:  Normocephalic and atraumatic. Eyes:  Without icterus, sclera clear and conjunctiva pink.  Ears:  Normal auditory acuity. Mouth: mask in place Lungs:  Clear to auscultation bilaterally. No wheezes, rales, or rhonchi. No distress.  Heart:  S1, S2 present without murmurs appreciated.  Abdomen:  +BS, soft, non-tender and non-distended. No HSM noted. No guarding or rebound. No masses appreciated.  Rectal:  Deferred  Msk:  Symmetrical without gross deformities. Normal posture. Extremities:  Without edema. Neurologic:  Alert and  oriented x4;  grossly normal neurologically. Skin:  Intact without significant lesions or rashes. Psych:  Alert and cooperative. Normal mood and affect.  ASSESSMENT: GOLDIA LIGMAN is a 75 y.o. female presenting today to arrange surveillance colonoscopy. She has a remote history of colon polyp in 2005. Last colonoscopy by Dr. Oneida Alar in 2012 with normal TI and normal colonic biospies, recommended in 5-10 years. She is on Eliquis with history  of afib. Prior stents in 2016 and 2017 on Plavix.   She has no concerning lower or upper GI signs/symptoms. She does report intermittent loose stool and skipping days of BMs. Will have her trial fiber every day to see if we can have more of a regimen. She will call if no improvement with this.  Will hold Plavix 5 days  prior and Eliquis 48 hours prior. We have obtained approval for this.    PLAN:  Proceed with colonoscopy by Dr. Abbey Chatters  in near future: the risks, benefits, and alternatives have been discussed with the patient in detail. The patient states understanding and desires to proceed.   HOLD Plavix 5 days prior HOLD Eliquis 48 hours prior  Start Benefiber daily: call if no improvement   Annitta Needs, PhD, [facility]

## 2021-05-13 NOTE — Telephone Encounter (Signed)
Pharmacy, can you please comment on how long Eliquis can be held for upcoming procedure?  Thank you! 

## 2021-05-13 NOTE — H&P (View-Only) (Signed)
Primary Care Physician:  Sharilyn Sites, MD Referring Physician: Delman Cheadle, PA-C/Dr. Gerarda Fraction Primary Gastroenterologist:  Dr. Abbey Chatters  Chief Complaint  Patient presents with   Consult    Due for TCS   change in bowels    Can go 2-3 days without BM and then can have several BM's in a day    HPI:   Virginia Huber is a 75 y.o. female presenting today at the request of Delman Cheadle, PA-C  for colonoscopy. Remote history of colon polyp in 2005. Last colonoscopy by Dr. Oneida Alar in 2012 with normal TI and normal colonic biospies, recommended in 5-10 years. She is on Eliquis with history of afib. Prior stents in 2016 and 2017 on Plavix. Here to discuss surveillance colonoscopy.   She takes Tums just as needed for indigestion. Denies any overt GI bleeding. She does note 2-3 days without a BM then will have multiple BMs. She has a long-standing history of RLQ discomfort dating back mayn years, present since at least 2012. No relieving factors. No precipitating factors. No association with bowel movements. This is at baseline.    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 h/o PAF/flutter during admission in 2013 for PNA. b. PAF during adm for NSTEMI 07/2015, subsequent paroxysms since then.   Atrial fibrillation with rapid ventricular response (HCC)    B12 deficiency anemia    Bradycardia 06/14/2016   Branch retinal vein occlusion with macular edema of right eye 10/11/2019   Bronchospasm 03/02/2012   CAD in native artery  11/30/2014   Chronic renal failure, stage 3b ([place]) 09/23/2017   Coronary artery disease 11/30/2014   a. remote MI. b. h/o PTCA with scoring balloon to OM1 11/2014. c. NSTEMI 03/2015 s/p DES to prox-mid Cx. d. 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 right eye 02/27/2020   Essential hypertension    FRACTURE, TOE 12/06/2007   Qualifier: Diagnosis of  By: Aline Brochure MD, Dorothyann Peng     GERD (gastroesophageal reflux disease)    History of blood transfusion 1980's   2nd surgical procedures   Hypercholesteremia    Hypokalemia 03/05/2012   Hypothyroidism    Multiple and bilateral precerebral artery syndromes 05/13/2016   Myocardial infarction ([place]) 02/2012   NSTEMI (non-ST elevated myocardial infarction) ([place]) 04/02/2015   OSA (obstructive sleep apnea) 05/13/2016   Ovarian tumor    PAD (peripheral artery disease) ([place])    a. s/p LE angio 2015; followed by Dr. Fletcher Anon - managed medically.   Pain with urination 05/08/2015   Paroxysmal atrial fibrillation ([place]) 12/08/2017   Pericardial effusion    a. 06/2016 after ppm - s/p pericardiocentesis.   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 s/p Medtronic PPM 06/2016, c/b lead perf/pericardial effusion.    Tamponade    TIA (transient ischemic attack)    Type 2 diabetes with nephropathy ([place]) 02/29/2012   Type II diabetes mellitus (HCC)    Typical atrial flutter (HCC)    UTI (urinary tract infection) 05/08/2013    Past Surgical History:  Procedure Laterality Date   ABDOMINAL AORTAGRAM N/A 01/03/2014   Procedure: ABDOMINAL [name];  Surgeon: Wellington Hampshire, MD;  Location: [facility];  Service: Cardiovascular;  Laterality: N/A;   ABDOMINAL HYSTERECTOMY  1972   "partial"   APPENDECTOMY  1970's   CARDIAC CATHETERIZATION  2008   Tiny OM-2 with 90% narrowing. Med tx.   CARDIAC CATHETERIZATION N/A 11/30/2014   Procedure: Left Heart Cath and Coronary Angiography;  Surgeon: Troy Sine, MD; LAD 20%, CFX 50%, OM1 95%, right PLB 30%, LV normal    CARDIAC CATHETERIZATION N/A 11/30/2014   Procedure: Coronary Balloon Angioplasty;  Surgeon: Troy Sine, MD;  Angiosculpt scoring balloon and PTCA to the OM1 reducing stenosis from 95% to less than 10%   CARDIAC CATHETERIZATION N/A 04/03/2015   Procedure: Left Heart Cath and Coronary Angiography;  Surgeon: Jolaine Artist, MD; dLAD 50%, CFX 90%, OM1 100%, PLA 15%, LVEDP 13     CARDIAC CATHETERIZATION N/A 04/03/2015   Procedure: Coronary Stent Intervention;  Surgeon: Sherren Mocha, MD; 3.0x18 mm Xience DES to the CFX     CARDIAC CATHETERIZATION N/A 08/02/2015   Procedure: Left Heart Cath and Coronary Angiography;  Surgeon: Troy Sine, MD;  Location: [facility];  Service: Cardiovascular;  Laterality: N/A;   CARDIAC CATHETERIZATION N/A 08/02/2015   Procedure: Coronary Stent Intervention;  Surgeon: Troy Sine, MD;  Location: [facility];  Service: Cardiovascular;  Laterality: N/A;   CARDIAC CATHETERIZATION N/A 06/25/2016   Procedure: Pericardiocentesis;  Surgeon: Will Meredith Leeds, MD;  Location: [facility];  Service: Cardiovascular;  Laterality: N/A;   cardiac stents     CARDIOVERSION N/A 12/15/2017   Procedure:  CARDIOVERSION;  Surgeon: Acie Fredrickson Wonda Cheng, MD;  Location: [facility];  Service: Cardiovascular;  Laterality: N/A;   CHOLECYSTECTOMY OPEN  1990's   COLONOSCOPY  2005   Dr. Laural Golden: pancolonic divericula, polyp, path unknown currently   COLONOSCOPY  2012   Dr. Oneida Alar: Normal TI, scattered diverticula in entire colon, small internal hemorrhoids, normal colon biopsies. Colonoscopy in 5-10 years.    COLOSTOMY  05/1979   COLOSTOMY REVERSAL  11/1979   EP IMPLANTABLE DEVICE N/A 06/25/2016   Procedure: Lead Revision/Repair;  Surgeon: Will Meredith Leeds, MD;  Location: [facility];  Service: Cardiovascular;  Laterality: N/A;   EP IMPLANTABLE DEVICE N/A 06/25/2016   Procedure: Pacemaker Implant;  Surgeon: Will Meredith Leeds, MD;  Location: [facility];  Service: Cardiovascular;  Laterality: N/A;   EXCISIONAL HEMORRHOIDECTOMY  1970's   EYE SURGERY Left 2000   "branch vein occlusion"   EYE SURGERY Left ~ 2001   "smoothed out wrinkle"   LEFT OOPHORECTOMY  05/1979   nicked bowel, peritonitis, colostomy; colostomy reversed 1981  LOWER EXTREMITY ANGIOGRAM N/A 01/03/2014   Procedure: LOWER EXTREMITY ANGIOGRAM;  Surgeon: Wellington Hampshire, MD;  Location: [facility];  Service: Cardiovascular;  Laterality: N/A;   Nuclear med stress test  10/2011   Small area of mild ischemia inferoapically.   PARTIAL HYSTERECTOMY  1970's   left ovaries, then ovaries removed later due tumors    RIGHT OOPHORECTOMY  1970's    Current Outpatient Medications  Medication Sig Dispense Refill   acetaminophen (TYLENOL) 325 MG tablet Take 650 mg by mouth every 6 (six) hours as needed for headache.     amiodarone (PACERONE) 200 MG tablet Take 0.5 tablets (100 mg total) by mouth daily. 45 tablet 1   apixaban (ELIQUIS) 5 MG TABS tablet Take 1 tablet (5 mg total) by mouth 2 (two) times daily. 28 tablet 0   atorvastatin (LIPITOR) 80 MG tablet TAKE ONE TABLET BY MOUTH EVERY DAY AT 6:00PM 90 tablet 3   carvedilol (COREG)  12.5 MG tablet Take 1 tablet (12.5 mg total) by mouth 2 (two) times daily. (Patient taking differently: Take 12.5 mg by mouth 2 (two) times daily. 1/2 tablet 2 (two) times daily) 180 tablet 3   Cholecalciferol (VITAMIN D3 PO) Take 1 tablet by mouth daily.      clopidogrel (PLAVIX) 75 MG tablet TAKE ONE TABLET (75MG  TOTAL) BY MOUTH DAILY 90 tablet 3   Continuous Blood Gluc Receiver (FREESTYLE LIBRE 2 READER) DEVI 1 each by Does not apply route daily. 1 each 0   Continuous Blood Gluc Sensor (FREESTYLE LIBRE 2 SENSOR) MISC 1 each by Does not apply route every 14 (fourteen) days. 6 each 3   cyanocobalamin (,VITAMIN B-12,) 1000 MCG/ML injection Inject 1,000 mcg into the muscle every 30 (thirty) days. Last injection was around 08/02/19     diltiazem (CARDIZEM CD) 360 MG 24 hr capsule Take 1 capsule (360 mg total) by mouth daily. 90 capsule 3   Dulaglutide (TRULICITY) 3 UX/3.2GM SOPN Inject 3 mg into the skin once a week. 6 mL 3   FREESTYLE LITE test strip 1 each by Other route 2 (two) times daily.      furosemide (LASIX) 40 MG tablet TAKE ONE AND HALF TABLET (60MG  TOTAL) BY MOUTH IN THE MORNING AND ONE TABLET IN THE EVENING (40MG  TOTAL) 180 tablet 4   hydrALAZINE (APRESOLINE) 50 MG tablet Take 1 tablet (50 mg total) by mouth 3 (three) times daily. (Patient taking differently: Take 50 mg by mouth 2 (two) times daily.) 270 tablet 3   insulin NPH-regular Human (NOVOLIN 70/30) (70-30) 100 UNIT/ML injection INJECT 35 UNITS BEFORE BREAKFAST AND 35 UNITS BEFORE SUPPER 45 mL 3   levothyroxine (SYNTHROID) 125 MCG tablet Take 1 tablet (125 mcg total) by mouth daily. 90 tablet 3   NEOMYCIN-POLYMYXIN-HYDROCORTISONE (CORTISPORIN) 1 % SOLN OTIC solution Apply 1-2 drops to nail once daily after shower 10 mL 0   nitroGLYCERIN (NITROSTAT) 0.4 MG SL tablet Place 1 tablet (0.4 mg total) under the tongue every 5 (five) minutes as needed for chest pain. 25 tablet 0   olmesartan (BENICAR) 40 MG tablet Take 40 mg by mouth daily.      potassium chloride SA (KLOR-CON) 20 MEQ tablet TAKE ONE (1) TABLET BY MOUTH EVERY DAY 90 tablet 1   rOPINIRole (REQUIP) 0.5 MG tablet Take 0.5 mg by mouth at bedtime.   0   No current facility-administered medications for this visit.    Allergies as of 05/13/2021 - Review Complete 05/13/2021  Allergen Reaction Noted  Penicillins Hives    Percocet [oxycodone-acetaminophen] Nausea And Vomiting 02/27/2012    Family History  Problem Relation Age of Onset   Heart disease Mother        deceased   Heart disease Father        deceased, heart disease   Diabetes Brother    Heart disease Brother    Thyroid disease Brother    Heart disease Sister    Heart disease Brother    Thyroid disease Brother    Lupus Daughter    Colon cancer Neg Hx     Social History   Socioeconomic History   Marital status: Married    Spouse name: Not on file   Number of children: Not on file   Years of education: Not on file   Highest education level: Not on file  Occupational History   Occupation: Retired    Fish farm manager: RETIRED    Comment: insurance billing  Tobacco Use   Smoking status: Never   Smokeless tobacco: Never   Tobacco comments:    Never smoked  Vaping Use   Vaping Use: Never used  Substance and Sexual Activity   Alcohol use: No    Alcohol/week: 0.0 standard drinks   Drug use: No   Sexual activity: Never    Birth control/protection: Surgical    Comment: hyst  Other Topics Concern   Not on file  Social History Narrative   Not on file   Social Determinants of Health   Financial Resource Strain: Not on file  Food Insecurity: Not on file  Transportation Needs: Not on file  Physical Activity: Not on file  Stress: Not on file  Social Connections: Not on file  Intimate Partner Violence: Not on file    Review of Systems: Gen: Denies any fever, chills, fatigue, weight loss, lack of appetite.  CV: Denies chest pain, heart palpitations, peripheral edema, syncope.  Resp: Denies  shortness of breath at rest or with exertion. Denies wheezing or cough.  GI: see HPI GU : Denies urinary burning, urinary frequency, urinary hesitancy MS: Denies joint pain, muscle weakness, cramps, or limitation of movement.  Derm: Denies rash, itching, dry skin Psych: Denies depression, anxiety, memory loss, and confusion Heme: Denies bruising, bleeding, and enlarged lymph nodes.  Physical Exam: BP (!) 149/78   Pulse 81   Temp (!) 96.6 F (35.9 C)   Ht 5\' 3"  (1.6 m)   Wt 152 lb 3.2 oz (69 kg)   BMI 26.96 kg/m  General:   Alert and oriented. Pleasant and cooperative. Well-nourished and well-developed.  Head:  Normocephalic and atraumatic. Eyes:  Without icterus, sclera clear and conjunctiva pink.  Ears:  Normal auditory acuity. Mouth: mask in place Lungs:  Clear to auscultation bilaterally. No wheezes, rales, or rhonchi. No distress.  Heart:  S1, S2 present without murmurs appreciated.  Abdomen:  +BS, soft, non-tender and non-distended. No HSM noted. No guarding or rebound. No masses appreciated.  Rectal:  Deferred  Msk:  Symmetrical without gross deformities. Normal posture. Extremities:  Without edema. Neurologic:  Alert and  oriented x4;  grossly normal neurologically. Skin:  Intact without significant lesions or rashes. Psych:  Alert and cooperative. Normal mood and affect.  ASSESSMENT: Virginia Huber is a 75 y.o. female presenting today to arrange surveillance colonoscopy. She has a remote history of colon polyp in 2005. Last colonoscopy by Dr. Oneida Alar in 2012 with normal TI and normal colonic biospies, recommended in 5-10 years. She is on Eliquis with history  of afib. Prior stents in 2016 and 2017 on Plavix.   She has no concerning lower or upper GI signs/symptoms. She does report intermittent loose stool and skipping days of BMs. Will have her trial fiber every day to see if we can have more of a regimen. She will call if no improvement with this.  Will hold Plavix 5 days  prior and Eliquis 48 hours prior. We have obtained approval for this.    PLAN:  Proceed with colonoscopy by Dr. Abbey Chatters  in near future: the risks, benefits, and alternatives have been discussed with the patient in detail. The patient states understanding and desires to proceed.   HOLD Plavix 5 days prior HOLD Eliquis 48 hours prior  Start Benefiber daily: call if no improvement   Annitta Needs, PhD, [facility]

## 2021-05-13 NOTE — Telephone Encounter (Signed)
Patient with diagnosis of afib on Eliquis for anticoagulation.    Procedure:  Colonoscopy Date of procedure: TBD   CHA2DS2-VASc Score = 9   This indicates a 12.2% annual risk of stroke. The patient's score is based upon: CHF History: 1 HTN History: 1 Diabetes History: 1 Stroke History: 2 (retinal vein occlusion) Vascular Disease History: 1 Age Score: 2 Gender Score: 1      CrCl 25.3 ml/min  Per office protocol, patient can hold Eliquis for 2 days prior to procedure. She should resume as safely possible after procedure.

## 2021-05-13 NOTE — Telephone Encounter (Signed)
Attention: Preop   We would like to request holding the following medication for patient please.  Procedure: Colonoscopy  Date: TBD  Medication to hold: Plavix x 5 days and Eliquis x 48 hrs  Surgeon: Dr. Abbey Chatters  Phone: [phone]  Fax:  [phone]  Type of Anesthesia: Propofol

## 2021-05-14 ENCOUNTER — Other Ambulatory Visit: Payer: Self-pay

## 2021-05-14 MED ORDER — PEG 3350-KCL-NA BICARB-NACL 420 G PO SOLR: 4000.0000 mL | ORAL | 0 refills | Status: DC

## 2021-05-14 NOTE — Telephone Encounter (Signed)
Hi Dr. Domenic Polite,  Virginia Huber is planning on having an upcoming colonoscopy and is being asked to hold Plavix. She has a history of CAD s/p PTCA of OM1 and DES to LCX in 2016 and then DES to RCA in 2017 as well as PAF, tachy-brady syndrome s/p PPM. You last saw her in 01/2021 at which time she continued to report chronic fatigue and dyspnea on exertion as well as some intermittent episodes of discomfort along PPM site but no exertional chest pain. Is it OK if she holds Plavix for 5 days prior to colonoscopy?  Please route response back to P CV DIV PREOP.  Thank you! Starla Deller

## 2021-05-14 NOTE — Telephone Encounter (Signed)
Requested clearance. Proceed with scheduling.

## 2021-05-14 NOTE — Telephone Encounter (Signed)
    Patient Name: Virginia Huber  DOB: March 18, 1946 MRN: [id number]  Primary Cardiologist: Rozann Lesches, MD  Chart reviewed as part of pre-operative protocol coverage. Patient was contacted today for pre-op evaluation and reported doing well from a cardiac standpoint since last visit in 02/2021. She denies any chest pain, shortness of breath, orthopnea/PND, palpitations, or syncope. She is able to complete >4.0 METS without any problems. Given past medical history and time since last visit, based on ACC/AHA guidelines, CYANNA NEACE would be at acceptable risk for the planned procedure without further cardiovascular testing.   Discussed with Pharmacy and Dr. Domenic Polite: Patient can hold Eliquis for 2 days and Plavix for 5 days prior to colonoscopy. Please restart both of these as soon as safely possible following procedure.   I will route this recommendation to the requesting party via Epic fax function and remove from pre-op pool.  Please call with questions.  Darreld Mclean, PA-C 05/14/2021, 9:32 AM

## 2021-05-14 NOTE — Telephone Encounter (Signed)
Called pt, TCS scheduled for 06/02/21 at 8:30am. Advised her to hold Plavix for 5 days before procedure and hold Eliquis for 48 hours before procedure. Rx for prep sent to pharmacy. Orders entered.

## 2021-05-14 NOTE — Telephone Encounter (Signed)
Pre-op appt 05/29/21. Appt letter mailed with procedure instructions. 

## 2021-05-19 ENCOUNTER — Other Ambulatory Visit: Payer: Self-pay | Admitting: Internal Medicine

## 2021-05-22 ENCOUNTER — Ambulatory Visit: Payer: HMO | Admitting: Family Medicine

## 2021-05-23 ENCOUNTER — Encounter: Payer: Self-pay | Admitting: Internal Medicine

## 2021-05-23 DIAGNOSIS — E1159 Type 2 diabetes mellitus with other circulatory complications: Secondary | ICD-10-CM

## 2021-05-23 DIAGNOSIS — E1165 Type 2 diabetes mellitus with hyperglycemia: Secondary | ICD-10-CM

## 2021-05-23 MED ORDER — FREESTYLE PRECISION NEO TEST VI STRP: ORAL_STRIP | 3 refills | Status: AC

## 2021-05-28 NOTE — Patient Instructions (Signed)
Virginia Huber  05/28/2021     @PREFPERIOPPHARMACY @   Your procedure is scheduled on  06/02/2021.   Report to [facility] at  0700  A.M.   Call this number if you have problems the morning of surgery:  [phone]   Remember:  Follow the diet and prep instructions given to you by the office.    Your last dose of plavix should be 05/27/2021 and your last dose of eliquis should be 05/30/2021.    DO NOT take any medications for diabetes the morning of your procedure.    Bring extra sensor supplies in case your sensor becomes dislodged during your procedure.    Take these medicines the morning of surgery with A SIP OF WATER                pacerone, carvedilol, ditiazem, levothyroxine.     Do not wear jewelry, make-up or nail polish.  Do not wear lotions, powders, or perfumes, or deodorant.  Do not shave 48 hours prior to surgery.  Men may shave face and neck.  Do not bring valuables to the hospital.  [facility] is not responsible for any belongings or valuables.  Contacts, dentures or bridgework may not be worn into surgery.  Leave your suitcase in the car.  After surgery it may be brought to your room.  For patients admitted to the hospital, discharge time will be determined by your treatment team.  Patients discharged the day of surgery will not be allowed to drive home and must have someone with them for 24 hours.    Special instructions:   DO NOT smoke tobacco or vape for 24 hours before your procedure.  Please read over the following fact sheets that you were given. Anesthesia Post-op Instructions and Care and Recovery After Surgery      Colonoscopy, Adult, Care After This sheet gives you information about how to care for yourself after your procedure. Your health care provider may also give you more specific instructions. If you have problems or questions, contact your health care provider. What can I expect after the procedure? After the procedure, it  is common to have: A small amount of blood in your stool for 24 hours after the procedure. Some gas. Mild cramping or bloating of your abdomen. Follow these instructions at home: Eating and drinking  Drink enough fluid to keep your urine pale yellow. Follow instructions from your health care provider about eating or drinking restrictions. Resume your normal diet as instructed by your health care provider. Avoid heavy or fried foods that are hard to digest. Activity Rest as told by your health care provider. Avoid sitting for a long time without moving. Get up to take short walks every 1-2 hours. This is important to improve blood flow and breathing. Ask for help if you feel weak or unsteady. Return to your normal activities as told by your health care provider. Ask your health care provider what activities are safe for you. Managing cramping and bloating  Try walking around when you have cramps or feel bloated. Apply heat to your abdomen as told by your health care provider. Use the heat source that your health care provider recommends, such as a moist heat pack or a heating pad. Place a towel between your skin and the heat source. Leave the heat on for 20-30 minutes. Remove the heat if your skin turns bright red. This is especially important if you are unable to  feel pain, heat, or cold. You may have a greater risk of getting burned. General instructions If you were given a sedative during the procedure, it can affect you for several hours. Do not drive or operate machinery until your health care provider says that it is safe. For the first 24 hours after the procedure: Do not sign important documents. Do not drink alcohol. Do your regular daily activities at a slower pace than normal. Eat soft foods that are easy to digest. Take over-the-counter and prescription medicines only as told by your health care provider. Keep all follow-up visits as told by your health care provider. This is  important. Contact a health care provider if: You have blood in your stool 2-3 days after the procedure. Get help right away if you have: More than a small spotting of blood in your stool. Large blood clots in your stool. Swelling of your abdomen. Nausea or vomiting. A fever. Increasing pain in your abdomen that is not relieved with medicine. Summary After the procedure, it is common to have a small amount of blood in your stool. You may also have mild cramping and bloating of your abdomen. If you were given a sedative during the procedure, it can affect you for several hours. Do not drive or operate machinery until your health care provider says that it is safe. Get help right away if you have a lot of blood in your stool, nausea or vomiting, a fever, or increased pain in your abdomen. This information is not intended to replace advice given to you by your health care provider. Make sure you discuss any questions you have with your health care provider. Document Revised: 04/14/2019 Document Reviewed: 01/02/2019 Elsevier Patient Education  [place] After This sheet gives you information about how to care for yourself after your procedure. Your health care provider may also give you more specific instructions. If you have problems or questions, contact your health care provider. What can I expect after the procedure? After the procedure, it is common to have: Tiredness. Forgetfulness about what happened after the procedure. Impaired judgment for important decisions. Nausea or vomiting. Some difficulty with balance. Follow these instructions at home: For the time period you were told by your health care provider:   Rest as needed. Do not participate in activities where you could fall or become injured. Do not drive or use machinery. Do not drink alcohol. Do not take sleeping pills or medicines that cause drowsiness. Do not make important  decisions or sign legal documents. Do not take care of children on your own. Eating and drinking Follow the diet that is recommended by your health care provider. Drink enough fluid to keep your urine pale yellow. If you vomit: Drink water, juice, or soup when you can drink without vomiting. Make sure you have little or no nausea before eating solid foods. General instructions Have a responsible adult stay with you for the time you are told. It is important to have someone help care for you until you are awake and alert. Take over-the-counter and prescription medicines only as told by your health care provider. If you have sleep apnea, surgery and certain medicines can increase your risk for breathing problems. Follow instructions from your health care provider about wearing your sleep device: Anytime you are sleeping, including during daytime naps. While taking prescription pain medicines, sleeping medicines, or medicines that make you drowsy. Avoid smoking. Keep all follow-up visits as told by your  health care provider. This is important. Contact a health care provider if: You keep feeling nauseous or you keep vomiting. You feel light-headed. You are still sleepy or having trouble with balance after 24 hours. You develop a rash. You have a fever. You have redness or swelling around the IV site. Get help right away if: You have trouble breathing. You have new-onset confusion at home. Summary For several hours after your procedure, you may feel tired. You may also be forgetful and have poor judgment. Have a responsible adult stay with you for the time you are told. It is important to have someone help care for you until you are awake and alert. Rest as told. Do not drive or operate machinery. Do not drink alcohol or take sleeping pills. Get help right away if you have trouble breathing, or if you suddenly become confused. This information is not intended to replace advice given to you  by your health care provider. Make sure you discuss any questions you have with your health care provider. Document Revised: 02/22/2020 Document Reviewed: 05/11/2019 Elsevier Patient Education  2022 [employer].

## 2021-05-29 ENCOUNTER — Encounter (HOSPITAL_COMMUNITY)
Admission: RE | Admit: 2021-05-29 | Discharge: 2021-05-29 | Disposition: A | Discharge status: Home / Self Care | Payer: HMO | Source: Ambulatory Visit | Attending: Internal Medicine | Admitting: Internal Medicine

## 2021-05-29 ENCOUNTER — Encounter (HOSPITAL_COMMUNITY): Payer: Self-pay

## 2021-05-29 VITALS — BP 154/67 | HR 82 | Temp 97.9°F | Resp 18 | Ht 63.0 in | Wt 150.0 lb

## 2021-05-29 DIAGNOSIS — E1121 Type 2 diabetes mellitus with diabetic nephropathy: Secondary | ICD-10-CM | POA: Insufficient documentation

## 2021-05-29 DIAGNOSIS — E1122 Type 2 diabetes mellitus with diabetic chronic kidney disease: Secondary | ICD-10-CM | POA: Insufficient documentation

## 2021-05-29 DIAGNOSIS — N1832 Chronic kidney disease, stage 3b: Secondary | ICD-10-CM | POA: Insufficient documentation

## 2021-05-29 DIAGNOSIS — Z01812 Encounter for preprocedural laboratory examination: Secondary | ICD-10-CM | POA: Diagnosis not present

## 2021-05-29 DIAGNOSIS — D62 Acute posthemorrhagic anemia: Secondary | ICD-10-CM | POA: Insufficient documentation

## 2021-05-29 LAB — CBC WITH DIFFERENTIAL/PLATELET
Abs Immature Granulocytes: 0.03 10*3/uL (ref 0.00–0.07)
Basophils Absolute: 0.1 10*3/uL (ref 0.0–0.1)
Basophils Relative: 1 %
Eosinophils Absolute: 0.2 10*3/uL (ref 0.0–0.5)
Eosinophils Relative: 2 %
HCT: 41.1 % (ref 36.0–46.0)
Hemoglobin: 13.3 g/dL (ref 12.0–15.0)
Immature Granulocytes: 0 %
Lymphocytes Relative: 13 %
Lymphs Abs: 1.2 10*3/uL (ref 0.7–4.0)
MCH: 31.4 pg (ref 26.0–34.0)
MCHC: 32.4 g/dL (ref 30.0–36.0)
MCV: 97.2 fL (ref 80.0–100.0)
Monocytes Absolute: 0.6 10*3/uL (ref 0.1–1.0)
Monocytes Relative: 7 %
Neutro Abs: 7.1 10*3/uL (ref 1.7–7.7)
Neutrophils Relative %: 77 %
Platelets: 265 10*3/uL (ref 150–400)
RBC: 4.23 MIL/uL (ref 3.87–5.11)
RDW: 15.1 % (ref 11.5–15.5)
WBC: 9.1 10*3/uL (ref 4.0–10.5)
nRBC: 0 % (ref 0.0–0.2)

## 2021-05-29 LAB — BASIC METABOLIC PANEL
Anion gap: 7 (ref 5–15)
BUN: 25 mg/dL — ABNORMAL HIGH (ref 8–23)
CO2: 28 mmol/L (ref 22–32)
Calcium: 9.1 mg/dL (ref 8.9–10.3)
Chloride: 105 mmol/L (ref 98–111)
Creatinine, Ser: 1.58 mg/dL — ABNORMAL HIGH (ref 0.44–1.00)
GFR, Estimated: 34 mL/min — ABNORMAL LOW (ref >60)
Glucose, Bld: 113 mg/dL — ABNORMAL HIGH (ref 70–99)
Potassium: 4 mmol/L (ref 3.5–5.1)
Sodium: 140 mmol/L (ref 135–145)

## 2021-06-02 ENCOUNTER — Ambulatory Visit (HOSPITAL_COMMUNITY)
Admission: RE | Admit: 2021-06-02 | Discharge: 2021-06-02 | Disposition: A | Discharge status: Home / Self Care | Payer: HMO | Attending: Internal Medicine | Admitting: Internal Medicine

## 2021-06-02 ENCOUNTER — Encounter (HOSPITAL_COMMUNITY)
Admission: RE | Disposition: A | Discharge status: Home / Self Care | Payer: Self-pay | Source: Home / Self Care | Attending: Internal Medicine

## 2021-06-02 ENCOUNTER — Other Ambulatory Visit: Payer: Self-pay

## 2021-06-02 ENCOUNTER — Ambulatory Visit (HOSPITAL_COMMUNITY): Payer: HMO | Admitting: Anesthesiology

## 2021-06-02 ENCOUNTER — Encounter (HOSPITAL_COMMUNITY): Payer: Self-pay

## 2021-06-02 DIAGNOSIS — K219 Gastro-esophageal reflux disease without esophagitis: Secondary | ICD-10-CM | POA: Insufficient documentation

## 2021-06-02 DIAGNOSIS — D123 Benign neoplasm of transverse colon: Secondary | ICD-10-CM | POA: Diagnosis not present

## 2021-06-02 DIAGNOSIS — D631 Anemia in chronic kidney disease: Secondary | ICD-10-CM | POA: Diagnosis not present

## 2021-06-02 DIAGNOSIS — I252 Old myocardial infarction: Secondary | ICD-10-CM | POA: Insufficient documentation

## 2021-06-02 DIAGNOSIS — Z8673 Personal history of transient ischemic attack (TIA), and cerebral infarction without residual deficits: Secondary | ICD-10-CM | POA: Diagnosis not present

## 2021-06-02 DIAGNOSIS — I130 Hypertensive heart and chronic kidney disease with heart failure and stage 1 through stage 4 chronic kidney disease, or unspecified chronic kidney disease: Secondary | ICD-10-CM | POA: Insufficient documentation

## 2021-06-02 DIAGNOSIS — N1832 Chronic kidney disease, stage 3b: Secondary | ICD-10-CM | POA: Diagnosis not present

## 2021-06-02 DIAGNOSIS — E1122 Type 2 diabetes mellitus with diabetic chronic kidney disease: Secondary | ICD-10-CM | POA: Insufficient documentation

## 2021-06-02 DIAGNOSIS — Z86010 Personal history of colonic polyps: Secondary | ICD-10-CM

## 2021-06-02 DIAGNOSIS — Z794 Long term (current) use of insulin: Secondary | ICD-10-CM | POA: Insufficient documentation

## 2021-06-02 DIAGNOSIS — K5730 Diverticulosis of large intestine without perforation or abscess without bleeding: Secondary | ICD-10-CM | POA: Insufficient documentation

## 2021-06-02 DIAGNOSIS — I2510 Atherosclerotic heart disease of native coronary artery without angina pectoris: Secondary | ICD-10-CM | POA: Diagnosis not present

## 2021-06-02 DIAGNOSIS — I4891 Unspecified atrial fibrillation: Secondary | ICD-10-CM | POA: Insufficient documentation

## 2021-06-02 DIAGNOSIS — K635 Polyp of colon: Secondary | ICD-10-CM | POA: Diagnosis not present

## 2021-06-02 DIAGNOSIS — Z955 Presence of coronary angioplasty implant and graft: Secondary | ICD-10-CM | POA: Diagnosis not present

## 2021-06-02 DIAGNOSIS — Z1211 Encounter for screening for malignant neoplasm of colon: Secondary | ICD-10-CM | POA: Insufficient documentation

## 2021-06-02 DIAGNOSIS — I5030 Unspecified diastolic (congestive) heart failure: Secondary | ICD-10-CM | POA: Diagnosis not present

## 2021-06-02 DIAGNOSIS — E039 Hypothyroidism, unspecified: Secondary | ICD-10-CM | POA: Diagnosis not present

## 2021-06-02 DIAGNOSIS — G709 Myoneural disorder, unspecified: Secondary | ICD-10-CM | POA: Diagnosis not present

## 2021-06-02 DIAGNOSIS — G4733 Obstructive sleep apnea (adult) (pediatric): Secondary | ICD-10-CM | POA: Diagnosis not present

## 2021-06-02 DIAGNOSIS — E1151 Type 2 diabetes mellitus with diabetic peripheral angiopathy without gangrene: Secondary | ICD-10-CM | POA: Diagnosis not present

## 2021-06-02 DIAGNOSIS — K5140 Inflammatory polyps of colon without complications: Secondary | ICD-10-CM | POA: Diagnosis not present

## 2021-06-02 DIAGNOSIS — Z950 Presence of cardiac pacemaker: Secondary | ICD-10-CM | POA: Insufficient documentation

## 2021-06-02 DIAGNOSIS — Z8719 Personal history of other diseases of the digestive system: Secondary | ICD-10-CM | POA: Insufficient documentation

## 2021-06-02 HISTORY — PX: COLONOSCOPY WITH PROPOFOL: SHX5780

## 2021-06-02 HISTORY — PX: POLYPECTOMY: SHX5525

## 2021-06-02 LAB — GLUCOSE, CAPILLARY: Glucose-Capillary: 160 mg/dL — ABNORMAL HIGH (ref 70–99)

## 2021-06-02 SURGERY — COLONOSCOPY WITH PROPOFOL
Anesthesia: General

## 2021-06-02 MED ORDER — LACTATED RINGERS IV SOLN: INTRAVENOUS | Status: DC

## 2021-06-02 MED ORDER — LIDOCAINE HCL (CARDIAC) PF 100 MG/5ML IV SOSY
PREFILLED_SYRINGE | INTRAVENOUS | Status: DC | PRN
  Administered 2021-06-02: 50 mg via INTRAVENOUS

## 2021-06-02 MED ORDER — PROPOFOL 10 MG/ML IV BOLUS
INTRAVENOUS | Status: DC | PRN
  Administered 2021-06-02: 30 mg via INTRAVENOUS
  Administered 2021-06-02: 100 mg via INTRAVENOUS
  Administered 2021-06-02: 50 mg via INTRAVENOUS

## 2021-06-02 NOTE — Anesthesia Postprocedure Evaluation (Signed)
Anesthesia Post Note  Patient: Virginia Huber  Procedure(s) Performed: COLONOSCOPY WITH PROPOFOL POLYPECTOMY  Patient location during evaluation: Phase II Anesthesia Type: General Level of consciousness: awake and alert and oriented Pain management: pain level controlled Vital Signs Assessment: post-procedure vital signs reviewed and stable Respiratory status: spontaneous breathing, nonlabored ventilation and respiratory function stable Cardiovascular status: blood pressure returned to baseline and stable Postop Assessment: no apparent nausea or vomiting Anesthetic complications: no   No notable events documented.   Last Vitals:  Vitals:   06/02/21 0945 06/02/21 0946  BP: (!) 120/44 (!) 120/44  Pulse: 71 70  Resp: 20 10  Temp: 36.7 C 37 C  SpO2: 99% 97%    Last Pain:  Vitals:   06/02/21 0946  TempSrc: Tympanic  PainSc: 0-No pain                 Dniyah Grant C Sherre Wooton

## 2021-06-02 NOTE — Anesthesia Preprocedure Evaluation (Addendum)
Anesthesia Evaluation  Patient identified by MRN, date of birth, ID band Patient awake    Reviewed: Allergy & Precautions, NPO status , Patient's Chart, lab work & pertinent test results, reviewed documented beta blocker date and time   Airway Mallampati: II  TM Distance: >3 FB Neck ROM: Full    Dental  (+) Dental Advisory Given Crowns :   Pulmonary shortness of breath and with exertion, sleep apnea and Continuous Positive Airway Pressure Ventilation , pneumonia,    Pulmonary exam normal breath sounds clear to auscultation       Cardiovascular Exercise Tolerance: Poor hypertension, Pt. on medications and Pt. on home beta blockers + angina + CAD, + Past MI, + Cardiac Stents, + Peripheral Vascular Disease and +CHF  + dysrhythmias Atrial Fibrillation + pacemaker  Rhythm:Regular Rate:Normal  1. Left ventricular ejection fraction, by estimation, is 55 to 60%. The left ventricle has normal function. The left ventricle has no regional wall motion abnormalities. There is moderate concentric left ventricular hypertrophy. Left ventricular diastolic function could not be evaluated. Left ventricular diastolic function could not be evaluated. Elevated left ventricular end-diastolic  pressure.  2. Right ventricular systolic function is normal. The right ventricular size is normal. There is mildly elevated pulmonary artery systolic  pressure.  3. Left atrial size was severely dilated.  4. The mitral valve is normal in structure and function. Trivial mitral  valve regurgitation. No evidence of mitral stenosis.  5. The aortic valve is tricuspid. Aortic valve regurgitation is not visualized. No aortic stenosis is present.  6. The inferior vena cava is normal in size with <50% respiratory variability, suggesting right atrial pressure of 8 mmHg.    Neuro/Psych TIA Neuromuscular disease    GI/Hepatic GERD  ,  Endo/Other  diabetes, Well Controlled,  Type 2, Insulin Dependent, Oral Hypoglycemic AgentsHypothyroidism   Renal/GU Renal InsufficiencyRenal disease     Musculoskeletal  (+) Arthritis ,   Abdominal   Peds  Hematology  (+) Blood dyscrasia, anemia ,   Anesthesia Other Findings CONCLUSIONS:   1. 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 Pericardiocentesis with removal of 450cc bloody fluid.    Pericardiocentesis note per Dr. Thompson Grayer   Will Meredith Leeds, MD 06/25/2016 2:31 PM    Reproductive/Obstetrics                            Anesthesia Physical Anesthesia Plan  ASA: 3  Anesthesia Plan: General   Post-op Pain Management: Minimal or no pain anticipated   Induction:   PONV Risk Score and Plan: TIVA  Airway Management Planned: Nasal Cannula, Natural Airway and Simple Face Mask  Additional Equipment:   Intra-op Plan:   Post-operative Plan:   Informed Consent: I have reviewed the patients History and Physical, chart, labs and discussed the procedure including the risks, benefits and alternatives for the proposed anesthesia with the patient or authorized representative who has indicated his/her understanding and acceptance.     Dental advisory given  Plan Discussed with: CRNA and Surgeon  Anesthesia Plan Comments:        Anesthesia Quick Evaluation

## 2021-06-02 NOTE — Op Note (Signed)
[facility] Patient Name: Virginia Huber Procedure Date: 06/02/2021 9:12 AM MRN: [id number] Date of Birth: [birth year] Attending MD: Elon Alas. Edgar Frisk CSN: [id number] Age: 75 Admit Type: Outpatient Procedure:                Colonoscopy Indications:              High risk colon cancer surveillance: Personal                            history of colonic polyps Providers:                Elon Alas. Abbey Chatters, DO, Lambert Mody, Dereck Leep, Technician Referring MD:              Medicines:                See the Anesthesia note for documentation of the                            administered medications Complications:            No immediate complications. Estimated Blood Loss:     Estimated blood loss was minimal. Procedure:                Pre-Anesthesia Assessment:                           - The anesthesia plan was to use monitored                            anesthesia care (MAC).                           After obtaining informed consent, the colonoscope                            was passed under direct vision. Throughout the                            procedure, the patient's blood pressure, pulse, and                            oxygen saturations were monitored continuously. The                            [id number]-HQ190L ([id number]) scope was introduced through                            the anus and advanced to the the cecum, identified                            by appendiceal orifice and ileocecal valve. The                            colonoscopy was performed without difficulty. The  patient tolerated the procedure well. The quality                            of the bowel preparation was evaluated using the                            BBPS [facility] Bowel Preparation Scale) with scores                            of: Right Colon = 2 (minor amount of residual                            staining, small fragments of  stool and/or opaque                            liquid, but mucosa seen well), Transverse Colon = 3                            (entire mucosa seen well with no residual staining,                            small fragments of stool or opaque liquid) and Left                            Colon = 3 (entire mucosa seen well with no residual                            staining, small fragments of stool or opaque                            liquid). The total BBPS score equals 8. The quality                            of the bowel preparation was good. Scope In: 9:26:28 AM Scope Out: 9:39:31 AM Scope Withdrawal Time: 0 hours 9 minutes 31 seconds  Total Procedure Duration: 0 hours 13 minutes 3 seconds  Findings:      The perianal and digital rectal examinations were normal.      Scattered small and large-mouthed diverticula were found in the entire       colon.      Two sessile polyps were found in the transverse colon. The polyps were 4       to 6 mm in size. These polyps were removed with a cold snare. Resection       and retrieval were complete.      The exam was otherwise without abnormality. Impression:               - Diverticulosis in the entire examined colon.                           - Two 4 to 6 mm polyps in the transverse colon,  removed with a cold snare. Resected and retrieved.                           - The examination was otherwise normal. Moderate Sedation:      Per Anesthesia Care Recommendation:           - Patient has a contact number available for                            emergencies. The signs and symptoms of potential                            delayed complications were discussed with the                            patient. Return to normal activities tomorrow.                            Written discharge instructions were provided to the                            patient.                           - Resume previous diet.                            - Continue present medications.                           - Await pathology results.                           - Repeat colonoscopy in 5 years for surveillance if                            benefits outweigh risks.                           - Return to GI clinic in 4 months. Procedure Code(s):        --- Professional ---                           [phone], Colonoscopy, flexible; with removal of                            tumor(s), polyp(s), or other lesion(s) by snare                            technique Diagnosis Code(s):        --- Professional ---                           Z86.010, Personal history of colonic polyps                           K63.5, Polyp of  colon                           K57.30, Diverticulosis of large intestine without                            perforation or abscess without bleeding CPT copyright 2019 American Medical Association. All rights reserved. The codes documented in this report are preliminary and upon coder review may  be revised to meet current compliance requirements. Elon Alas. Abbey Chatters, DO [place] Abbey Chatters, DO 06/02/2021 9:42:51 AM This report has been signed electronically. Number of Addenda: 0

## 2021-06-02 NOTE — Interval H&P Note (Signed)
History and Physical Interval Note:  06/02/2021 8:46 AM  Virginia Huber  has presented today for surgery, with the diagnosis of history of polyps.  The various methods of treatment have been discussed with the patient and family. After consideration of risks, benefits and other options for treatment, the patient has consented to  Procedure(s) with comments: COLONOSCOPY WITH PROPOFOL (N/A) - 8:30am as a surgical intervention.  The patient's history has been reviewed, patient examined, no change in status, stable for surgery.  I have reviewed the patient's chart and labs.  Questions were answered to the patient's satisfaction.     Eloise Harman

## 2021-06-02 NOTE — Transfer of Care (Signed)
Immediate Anesthesia Transfer of Care Note  Patient: Virginia Huber  Procedure(s) Performed: COLONOSCOPY WITH PROPOFOL POLYPECTOMY  Patient Location: PACU  Anesthesia Type:General  Level of Consciousness: awake, alert  and oriented  Airway & Oxygen Therapy: Patient Spontanous Breathing  Post-op Assessment: Report given to RN and Post -op Vital signs reviewed and stable  Post vital signs: Reviewed and stable  Last Vitals:  Vitals Value Taken Time  BP 120/44 06/02/21 0946  Temp 37 C 06/02/21 0946  Pulse 70 06/02/21 0946  Resp 10 06/02/21 0946  SpO2 97 % 06/02/21 0946    Last Pain:  Vitals:   06/02/21 0946  TempSrc: Tympanic  PainSc: 0-No pain      Patients Stated Pain Goal: 5 (02/63/78 5885)  Complications: No notable events documented.

## 2021-06-02 NOTE — Discharge Instructions (Signed)
  Colonoscopy Discharge Instructions  Read the instructions outlined below and refer to this sheet in the next few weeks. These discharge instructions provide you with general information on caring for yourself after you leave the hospital. Your doctor may also give you specific instructions. While your treatment has been planned according to the most current medical practices available, unavoidable complications occasionally occur.   ACTIVITY You may resume your regular activity, but move at a slower pace for the next 24 hours.  Take frequent rest periods for the next 24 hours.  Walking will help get rid of the air and reduce the bloated feeling in your belly (abdomen).  No driving for 24 hours (because of the medicine (anesthesia) used during the test).   Do not sign any important legal documents or operate any machinery for 24 hours (because of the anesthesia used during the test).  NUTRITION Drink plenty of fluids.  You may resume your normal diet as instructed by your doctor.  Begin with a light meal and progress to your normal diet. Heavy or fried foods are harder to digest and may make you feel sick to your stomach (nauseated).  Avoid alcoholic beverages for 24 hours or as instructed.  MEDICATIONS You may resume your normal medications unless your doctor tells you otherwise.  WHAT YOU CAN EXPECT TODAY Some feelings of bloating in the abdomen.  Passage of more gas than usual.  Spotting of blood in your stool or on the toilet paper.  IF YOU HAD POLYPS REMOVED DURING THE COLONOSCOPY: No aspirin products for 7 days or as instructed.  No alcohol for 7 days or as instructed.  Eat a soft diet for the next 24 hours.  FINDING OUT THE RESULTS OF YOUR TEST Not all test results are available during your visit. If your test results are not back during the visit, make an appointment with your caregiver to find out the results. Do not assume everything is normal if you have not heard from your  caregiver or the medical facility. It is important for you to follow up on all of your test results.  SEEK IMMEDIATE MEDICAL ATTENTION IF: You have more than a spotting of blood in your stool.  Your belly is swollen (abdominal distention).  You are nauseated or vomiting.  You have a temperature over 101.  You have abdominal pain or discomfort that is severe or gets worse throughout the day.   Your colonoscopy revealed 2 polyp(s) which I removed successfully. Await pathology results, my office will contact you. I recommend repeating colonoscopy in 5 years for surveillance purposes if benefit outweigh risks. You also have diverticulosis and internal hemorrhoids. I would recommend continuing on OTC Benefiber/Metamucil. Be sure to drink at least 4 to 6 glasses of water daily. Follow-up with GI as 3-4 months to discuss your GI issues further.     I hope you have a great rest of your week!  Elon Alas. Abbey Chatters, D.O. Gastroenterology and Hepatology [facility]

## 2021-06-03 LAB — SURGICAL PATHOLOGY

## 2021-06-04 DIAGNOSIS — I5032 Chronic diastolic (congestive) heart failure: Secondary | ICD-10-CM | POA: Diagnosis not present

## 2021-06-04 DIAGNOSIS — R809 Proteinuria, unspecified: Secondary | ICD-10-CM | POA: Diagnosis not present

## 2021-06-04 DIAGNOSIS — E1129 Type 2 diabetes mellitus with other diabetic kidney complication: Secondary | ICD-10-CM | POA: Diagnosis not present

## 2021-06-04 DIAGNOSIS — E1122 Type 2 diabetes mellitus with diabetic chronic kidney disease: Secondary | ICD-10-CM | POA: Diagnosis not present

## 2021-06-04 DIAGNOSIS — N189 Chronic kidney disease, unspecified: Secondary | ICD-10-CM | POA: Diagnosis not present

## 2021-06-04 DIAGNOSIS — E211 Secondary hyperparathyroidism, not elsewhere classified: Secondary | ICD-10-CM | POA: Diagnosis not present

## 2021-06-04 DIAGNOSIS — I129 Hypertensive chronic kidney disease with stage 1 through stage 4 chronic kidney disease, or unspecified chronic kidney disease: Secondary | ICD-10-CM | POA: Diagnosis not present

## 2021-06-04 DIAGNOSIS — E559 Vitamin D deficiency, unspecified: Secondary | ICD-10-CM | POA: Diagnosis not present

## 2021-06-06 ENCOUNTER — Encounter: Payer: Self-pay | Admitting: Internal Medicine

## 2021-06-06 ENCOUNTER — Other Ambulatory Visit: Payer: Self-pay

## 2021-06-06 ENCOUNTER — Ambulatory Visit (INDEPENDENT_AMBULATORY_CARE_PROVIDER_SITE_OTHER): Payer: HMO | Admitting: Internal Medicine

## 2021-06-06 VITALS — BP 120/72 | HR 83 | Ht 63.0 in | Wt 152.2 lb

## 2021-06-06 DIAGNOSIS — E039 Hypothyroidism, unspecified: Secondary | ICD-10-CM

## 2021-06-06 DIAGNOSIS — E1159 Type 2 diabetes mellitus with other circulatory complications: Secondary | ICD-10-CM

## 2021-06-06 DIAGNOSIS — E785 Hyperlipidemia, unspecified: Secondary | ICD-10-CM

## 2021-06-06 DIAGNOSIS — E1165 Type 2 diabetes mellitus with hyperglycemia: Secondary | ICD-10-CM

## 2021-06-06 LAB — POCT GLYCOSYLATED HEMOGLOBIN (HGB A1C): Hemoglobin A1C: 7.5 % — AB (ref 4.0–5.6)

## 2021-06-06 MED ORDER — SEMAGLUTIDE (2 MG/DOSE) 8 MG/3ML ~~LOC~~ SOPN: 2.0000 mg | PEN_INJECTOR | SUBCUTANEOUS | 3 refills | Status: DC

## 2021-06-06 NOTE — Addendum Note (Signed)
Addended by: Lauralyn Primes on: 06/06/2021 09:28 AM   Modules accepted: Orders

## 2021-06-06 NOTE — Progress Notes (Signed)
Patient ID: Virginia Huber, female   DOB: Nov 17, 1945, 75 y.o.   MRN: [id number]   This visit occurred during the SARS-CoV-2 public health emergency.  Safety protocols were in place, including screening questions prior to the visit, additional usage of staff PPE, and extensive cleaning of exam room while observing appropriate contact time as indicated for disinfecting solutions.    HPI: Virginia Huber is a 75 y.o.-year-old female, initially referred by her PCP, Dr.Golding, returning for follow-up for DM2, dx in ~2005, insulin-dependent since 2006, uncontrolled, with complications (CAD, s/p NSTEMI, s/p PTCA and DES; CHF; PAF/flutter; PAD; CKD; cerebrovascular disease - s/p TIA; PN).  She previously saw my colleague, Dr. Loanne Drilling.  Our last visit was 4 months ago.  Interim history: At last visit, she was having blurry vision and getting intraocular injections. She had eye surgery 03/2021 >> vision is better. She has onychomycosis and sees podiatry. No increased urination, nausea, chest pain.  DM2: Reviewed HbA1c levels: Lab Results  Component Value Date   HGBA1C 7.7 (A) 02/04/2021   HGBA1C 7.4 (A) 10/22/2020   HGBA1C 6.7 (A) 07/23/2020   HGBA1C 7.3 (A) 04/15/2020   HGBA1C 8.7 (A) 02/14/2020   HGBA1C 7.7 12/04/2019   HGBA1C 7.0 (H) 08/24/2019   HGBA1C 6.8 (H) 12/17/2017   HGBA1C 6.6 (H) 08/05/2017   HGBA1C 6.9 (H) 04/30/2017   Pt is on a regimen of: - NPH/R 70/30 20-22 >> 28 units in am and 28-30 >> 24-26 >> 25 units before dinner  - Trulicity 3 mg weekly No FH of MTC or personal h/o pancreatitis. She was on Metformin >> stopped b/c CKD.  Pt checks her sugars 4x a day with the Libre 2 CGM:   Previously:   Lowest sugar was 66 (before decreasing insulin dose) >> 58 (Cipro) >> 59 (CGM) >> 50s (sensor - higher on glucometer); she has hypoglycemia awareness in the 70s.  Highest sugar was 208 >> 200 >> 208 >> 200.  Glucometer: Freestyle lite  Patient starting to improve her meals  after her last HbA1c returned: - Brunch: scrambled egg + bacon + rye bread or cheerios + skim milk - Dinner: meat - grilled + veggies - Snacks: seldom - apple Drinks occasional diet drinks  -+ CKD- Dr. Theador Hawthorne, last BUN/creatinine:   Ref Range & Units 12/26/2020  Glucose 65 - 99 mg/dL 238 High    BUN 8 - 27 mg/dL 31 High    Creatinine 0.57 - 1.00 mg/dL 1.65 High    eGFR CKD-EPI CR 2021 >59 mL/min/1.73 32 Low     Lab Results  Component Value Date   BUN 25 (H) 05/29/2021   BUN 30 (H) 08/22/2020   CREATININE 1.58 (H) 05/29/2021   CREATININE 1.79 (H) 08/22/2020  06/24/2020: BUN/creatinine 24/1.7 On Benicar 40.  -+ HL; last set of lipids: 07/17/2020: 121/146/38/58 Lab Results  Component Value Date   CHOL 166 08/05/2017   HDL 43 (L) 08/05/2017   LDLCALC 96 08/05/2017   TRIG 160 (H) 08/05/2017   CHOLHDL 3.9 08/05/2017  On Lipitor 80.  - last eye exam was on 06/26/2020: + DR. Dr Zadie Rhine.  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  She has BRAO + macular edema OD  - she has numbness and tingling in her feet.  Pt has FH of DM in M and her family.  Hypothyroidism:  Pt is on levothyroxine  125 mcg daily (dose decreased to 10/2020), taken: - in am -  fasting - at least 30 min from b'fast - no calcium - no iron - no multivitamins - no PPIs - not on Biotin  TSH levels were reviewed: Lab Results  Component Value Date   TSH 0.40 02/04/2021   TSH 0.22 (L) 10/22/2020   TSH 0.233 (L) 08/22/2020   TSH 2.410 02/22/2020   TSH 0.424 (L) 08/15/2019   TSH 0.059 (L) 12/17/2017   TSH 0.05 (L) 12/06/2017   TSH 11.574 (H) 09/23/2017   TSH 12.65 (H) 08/05/2017   TSH 4.97 (H) 04/30/2017   She has an extensive cardiac history.  Besides mentioned in HPI, she also has a history of heart block, tachybradycardia syndrome, pericardial effusion (history of tamponade).  She is on amiodarone, Eliquis, Plavix, Lasix.    ROS: + see HPI  I reviewed pt's  medications, allergies, PMH, social hx, family hx, and changes were documented in the history of present illness. Otherwise, unchanged from my initial visit note.  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 h/o PAF/flutter during admission in 2013 for PNA. b. PAF during adm for NSTEMI 07/2015, subsequent paroxysms since then.   Atrial fibrillation with rapid ventricular response (HCC)    B12 deficiency anemia    Bradycardia 06/14/2016   Branch retinal vein occlusion with macular edema of right eye 10/11/2019   Bronchospasm 03/02/2012   CAD in native artery 11/30/2014   Chronic renal failure, stage 3b ([place]) 09/23/2017   Coronary artery disease 11/30/2014   a. remote MI. b. h/o PTCA with scoring balloon to OM1 11/2014. c. NSTEMI 03/2015 s/p DES to prox-mid Cx. d. NSTEMI 07/2015 s/p scoring balloon/PTCA/DES to dRCA with PAF during that admission   Coronary artery disease involving coronary bypass graft of native heart with unstable angina pectoris ([place])    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    Cutaneous lupus erythematosus    Early stage nonexudative age-related macular degeneration of right eye 02/27/2020   Essential hypertension    FRACTURE, TOE 12/06/2007   Qualifier: Diagnosis of  By: Aline Brochure MD, Dorothyann Peng     GERD (gastroesophageal reflux disease)    History of blood transfusion 1980's   2nd surgical procedures   Hypercholesteremia    Hypokalemia 03/05/2012   Hypothyroidism     Multiple and bilateral precerebral artery syndromes 05/13/2016   Myocardial infarction ([place]) 02/2012   NSTEMI (non-ST elevated myocardial infarction) ([place]) 04/02/2015   OSA (obstructive sleep apnea) 05/13/2016   Ovarian tumor    PAD (peripheral artery disease) ([place])    a. s/p LE angio 2015; followed by Dr. Fletcher Anon - managed medically.   Pain with urination 05/08/2015   Paroxysmal atrial fibrillation ([place]) 12/08/2017   Pericardial effusion    a. 06/2016 after ppm - s/p pericardiocentesis.   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 s/p Medtronic PPM 06/2016, c/b lead perf/pericardial effusion.   Tamponade    TIA (transient ischemic attack)    Type 2 diabetes with nephropathy ([place]) 02/29/2012   Type II diabetes mellitus (HCC)    Typical atrial flutter (HCC)    UTI (urinary tract infection) 05/08/2013   Past Surgical History:  Procedure Laterality Date   ABDOMINAL AORTAGRAM N/A 01/03/2014   Procedure: ABDOMINAL [name];  Surgeon: Wellington Hampshire, MD;  Location: [facility] CATH LAB;  Service: Cardiovascular;  Laterality: N/A;   ABDOMINAL HYSTERECTOMY  06/22/1970   "partial"   APPENDECTOMY  02/20/1969   CARDIAC CATHETERIZATION  06/22/2006   Tiny OM-2 with 90% narrowing. Med tx.   CARDIAC CATHETERIZATION N/A 11/30/2014   Procedure: Left Heart Cath and Coronary Angiography;  Surgeon: Troy Sine, MD; LAD 20%, CFX 50%, OM1 95%, right PLB 30%, LV normal    CARDIAC CATHETERIZATION N/A 11/30/2014   Procedure: Coronary Balloon Angioplasty;  Surgeon: Troy Sine, MD;   Angiosculpt scoring balloon and PTCA to the OM1 reducing stenosis from 95% to less than 10%   CARDIAC CATHETERIZATION N/A 04/03/2015   Procedure: Left Heart Cath and Coronary Angiography;  Surgeon: Jolaine Artist, MD; dLAD 50%, CFX 90%, OM1 100%, PLA 15%, LVEDP 13     CARDIAC CATHETERIZATION N/A 04/03/2015   Procedure: Coronary Stent Intervention;  Surgeon: Sherren Mocha, MD; 3.0x18 mm Xience DES to the CFX     CARDIAC CATHETERIZATION N/A 08/02/2015   Procedure: Left Heart Cath and Coronary Angiography;  Surgeon: Troy Sine, MD;  Location: [facility] CV LAB;  Service: Cardiovascular;  Laterality: N/A;   CARDIAC CATHETERIZATION N/A 08/02/2015   Procedure: Coronary Stent Intervention;  Surgeon: Troy Sine, MD;  Location: [place] [facility] LAB;  Service: Cardiovascular;  Laterality: N/A;   CARDIAC CATHETERIZATION N/A 06/25/2016   Procedure: Pericardiocentesis;  Surgeon: Will Meredith Leeds, MD;  Location: [facility] LAB;  Service: Cardiovascular;  Laterality: N/A;   cardiac stents     CARDIOVERSION N/A 12/15/2017   Procedure: CARDIOVERSION;  Surgeon: Acie Fredrickson Wonda Cheng, MD;  Location: [facility];  Service: Cardiovascular;  Laterality: N/A;   CHOLECYSTECTOMY OPEN  02/20/1989   COLONOSCOPY  06/23/2003   Dr. Laural Golden: pancolonic divericula, polyp, path unknown currently   COLONOSCOPY  06/22/2010   Dr. Oneida Alar: Normal TI, scattered diverticula in entire colon, small internal hemorrhoids, normal colon biopsies. Colonoscopy in 5-10 years.    COLOSTOMY  05/23/1979   COLOSTOMY REVERSAL  11/21/1979   EP IMPLANTABLE DEVICE N/A 06/25/2016   Procedure: Lead Revision/Repair;  Surgeon: Will Meredith Leeds, MD;  Location: [place] CV LAB;  Service: Cardiovascular;  Laterality: N/A;   EP IMPLANTABLE DEVICE N/A 06/25/2016   Procedure: Pacemaker Implant;  Surgeon: Will Meredith Leeds, MD;  Location: [place] CV LAB;  Service: Cardiovascular;  Laterality: N/A;   EXCISIONAL HEMORRHOIDECTOMY   02/20/1969   EYE SURGERY Left 06/22/1998   "branch vein occlusion"   EYE SURGERY Left ~ 2001   "smoothed out wrinkle"   LEFT OOPHORECTOMY  05/23/1979   nicked bowel, peritonitis, colostomy; colostomy reversed 1981    LOWER EXTREMITY ANGIOGRAM N/A 01/03/2014   Procedure: LOWER EXTREMITY ANGIOGRAM;  Surgeon: Wellington Hampshire, MD;  Location: [facility] CATH LAB;  Service: Cardiovascular;  Laterality: N/A;  Nuclear med stress test  10/21/2011   Small area of mild ischemia inferoapically.   PARTIAL HYSTERECTOMY  02/20/1969   left ovaries, then ovaries removed later due tumors    right eye surgery  03/2021   RIGHT OOPHORECTOMY  02/20/1969   Social History   Socioeconomic History   Marital status: Married    Spouse name: Not on file   Number of children: Not on file   Years of education: Not on file   Highest education level: Not on file  Occupational History   Occupation: Retired    Fish farm manager: RETIRED    Comment: insurance billing  Tobacco Use   Smoking status: Never   Smokeless tobacco: Never   Tobacco comments:    Never smoked  Vaping Use   Vaping Use: Never used  Substance and Sexual Activity   Alcohol use: No    Alcohol/week: 0.0 standard drinks   Drug use: No   Sexual activity: Never    Birth control/protection: Surgical    Comment: hyst  Other Topics Concern   Not on file  Social History Narrative   Not on file   Social Determinants of Health   Financial Resource Strain: Not on file  Food Insecurity: Not on file  Transportation Needs: Not on file  Physical Activity: Not on file  Stress: Not on file  Social Connections: Not on file  Intimate Partner Violence: Not on file   Current Outpatient Medications on File Prior to Visit  Medication Sig Dispense Refill   acetaminophen (TYLENOL) 325 MG tablet Take 650 mg by mouth every 6 (six) hours as needed for headache.     amiodarone (PACERONE) 200 MG tablet Take 0.5 tablets (100 mg total) by mouth daily. 45 tablet 1    apixaban (ELIQUIS) 5 MG TABS tablet Take 1 tablet (5 mg total) by mouth 2 (two) times daily. 28 tablet 0   atorvastatin (LIPITOR) 80 MG tablet TAKE ONE TABLET BY MOUTH EVERY DAY AT 6:00PM 90 tablet 3   carvedilol (COREG) 25 MG tablet Take 12.5 mg by mouth 2 (two) times daily.     Cholecalciferol (VITAMIN D3 PO) Take 2,000 Units by mouth every evening.     clopidogrel (PLAVIX) 75 MG tablet TAKE ONE TABLET (75MG TOTAL) BY MOUTH DAILY 90 tablet 3   Continuous Blood Gluc Receiver (FREESTYLE LIBRE 2 READER) DEVI 1 each by Does not apply route daily. 1 each 0   Continuous Blood Gluc Sensor (FREESTYLE LIBRE 2 SENSOR) MISC USE ONE FOR FOURTEEN DAYS 6 each 3   cyanocobalamin (,VITAMIN B-12,) 1000 MCG/ML injection Inject 1,000 mcg into the muscle every 30 (thirty) days. Last injection was around 08/02/19     diltiazem (TIAZAC) 360 MG 24 hr capsule Take 360 mg by mouth every morning.     Dulaglutide (TRULICITY) 3 RC/3.8FM SOPN Inject 3 mg into the skin once a week. (Patient taking differently: Inject 3 mg into the skin every Wednesday.) 6 mL 3   furosemide (LASIX) 40 MG tablet TAKE ONE AND HALF TABLET (60MG TOTAL) BY MOUTH IN THE MORNING AND ONE TABLET IN THE EVENING (40MG TOTAL) 180 tablet 4   glucose blood (FREESTYLE PRECISION NEO TEST) test strip Use as instructed 100 each 3   hydrALAZINE (APRESOLINE) 50 MG tablet Take 1 tablet (50 mg total) by mouth 3 (three) times daily. (Patient taking differently: Take 50 mg by mouth 2 (two) times daily.) 270 tablet 3   insulin NPH-regular Human (NOVOLIN 70/30) (70-30) 100 UNIT/ML injection INJECT  35 UNITS BEFORE BREAKFAST AND 35 UNITS BEFORE SUPPER (Patient taking differently: 28-30 Units in the morning and at bedtime.) 45 mL 3   levothyroxine (SYNTHROID) 125 MCG tablet Take 1 tablet (125 mcg total) by mouth daily. 90 tablet 3   NEOMYCIN-POLYMYXIN-HYDROCORTISONE (CORTISPORIN) 1 % SOLN OTIC solution Apply 1-2 drops to nail once daily after shower (Patient taking  differently: 1-2 drops daily as needed (ingrown toe nail soreness).) 10 mL 0   nitroGLYCERIN (NITROSTAT) 0.4 MG SL tablet Place 1 tablet (0.4 mg total) under the tongue every 5 (five) minutes as needed for chest pain. 25 tablet 0   olmesartan (BENICAR) 40 MG tablet Take 40 mg by mouth in the morning.     potassium chloride SA (KLOR-CON) 20 MEQ tablet TAKE ONE (1) TABLET BY MOUTH EVERY DAY (Patient taking differently: 20 mEq every evening.) 90 tablet 1   rOPINIRole (REQUIP) 0.5 MG tablet Take 0.5 mg by mouth at bedtime.   0   No current facility-administered medications on file prior to visit.   Allergies  Allergen Reactions   Penicillins Hives    Has patient had a PCN reaction causing immediate rash, facial/tongue/throat swelling, SOB or lightheadedness with hypotension: Yes Has patient had a PCN reaction causing severe rash involving mucus membranes or skin necrosis: No Has patient had a PCN reaction that required hospitalization No Has patient had a PCN reaction occurring within the last 10 years: No If all of the above answers are "NO", then may proceed with Cephalosporin use.   Percocet [Oxycodone-Acetaminophen] Nausea And Vomiting   Family History  Problem Relation Age of Onset   Heart disease Mother        deceased   Heart disease Father        deceased, heart disease   Diabetes Brother    Heart disease Brother    Thyroid disease Brother    Heart disease Sister    Heart disease Brother    Thyroid disease Brother    Lupus Daughter    Colon cancer Neg Hx    PE: BP 120/72 (BP Location: Right Arm, Patient Position: Sitting, Cuff Size: Normal)    Pulse 83    Ht 5' 3"  (1.6 m)    Wt 152 lb 3.2 oz (69 kg)    SpO2 97%    BMI 26.96 kg/m  Wt Readings from Last 3 Encounters:  06/06/21 152 lb 3.2 oz (69 kg)  05/29/21 150 lb (68 kg)  05/13/21 152 lb 3.2 oz (69 kg)   Constitutional: Slightly overweight, in NAD Eyes: PERRLA, EOMI, no exophthalmos ENT: moist mucous membranes, no  thyromegaly, no cervical lymphadenopathy Cardiovascular: RRR, No MRG Respiratory: CTA B Musculoskeletal: no deformities, strength intact in all 4 Skin: moist, warm, no rashes Neurological: no tremor with outstretched hands, DTR normal in all 4  ASSESSMENT: 1. DM2, insulin-dependent, uncontrolled, withcomplications - CAD, s/p NSTEMI 07/2015 and 03/2015, s/p PTCA and DES - CHF - PAF/flutter - PAD - cerebrovascular disease - s/p TIA - CKD  2. HL  3.  Hypothyroidism  PLAN:  1. Patient with longstanding, uncontrolled, type 2 diabetes, on premixed insulin regimen and also weekly GLP-1 receptor agonist proved control in the last year, but higher HbA1c at last visit.  At that time, sugars were mostly at goal, with few hyperglycemic exceptions and also with occasional lower blood sugars, per CGM readings.  At that time, we decreased her premixed insulin before dinner to avoid low blood sugars overnight.  We did not change  the rest of the regimen.  We discussed about management of her regimen around the time of her colonoscopy.  HbA1c was higher, at 7.7%.we did discuss about adding an SGLT2 inhibitor in the past but I advised her to check with her nephrologist first.  She saw Dr. Theador Hawthorne in 04/2020 and he wanted [place] to hold off using an SGLT2 inhibitor as her kidney function was trending down.  The GFR was improved in 12/2020. CGM interpretation: -At today's visit, we reviewed her CGM downloads: It appears that 86% of values are in target range (goal >70%), while 6% are higher than 180 (goal <25%), and 8% are lower than 70 (goal <4%).  The calculated average blood sugar is 116.   -Reviewing the CGM trends, her sugars appear to be better control overnight and in the first half of the day, with the increase especially after dinner.  She does have an early dinner.  She also has loss of data afterwards, but reviewing the individual daily tracings, it appears that sugars do decrease after dinner.  She  increased her morning premixed insulin dose as she thought that sugars increasing lately, around the holidays. -At this visit, we discussed about changing from Trulicity to [place] and I advised her that if she sees that the sugars start to decrease afterwards, to back off the premixed insulin doses. - I suggested to:  Patient Instructions  Please continue: - NPH/R 70/30 28 units in am and 25 units before dinner  Please change from Trulicity to: - Ozempic 2 mg weekly  Continue Levothyroxine 125 mcg daily.  Take the thyroid hormone every day, with water, at least 30 minutes before breakfast, separated by at least 4 hours from: - acid reflux medications - calcium - iron - multivitamins  Please return in 4 months.  - we checked her HbA1c: 7.5% (slightly better) - advised to check sugars at different times of the day - 4x a day, rotating check times - advised for yearly eye exams >> she is UTD - return to clinic in 4 months  2. HL -Reviewed latest lipid panel from 06/2020: 121/146/38/58 -LDL at goal, HDL slightly low -Continues Lipitor 80 mg daily without side effects  3.  Hypothyroidism - latest thyroid labs reviewed with pt. >> normal: Lab Results  Component Value Date   TSH 0.40 02/04/2021  - she continues on LT4 125 mcg daily - pt feels good on this dose. - we discussed about taking the thyroid hormone every day, with water, >30 minutes before breakfast, separated by >4 hours from acid reflux medications, calcium, iron, multivitamins. Pt. is taking it correctly.  Philemon Kingdom, MD PhD [facility] Endocrinology

## 2021-06-06 NOTE — Patient Instructions (Addendum)
Please continue: - NPH/R 70/30 28 units in am and 25 units before dinner  Please change from Trulicity to: - Ozempic 2 mg weekly  Continue Levothyroxine 125 mcg daily.  Take the thyroid hormone every day, with water, at least 30 minutes before breakfast, separated by at least 4 hours from: - acid reflux medications - calcium - iron - multivitamins  Please return in 4 months.

## 2021-06-10 ENCOUNTER — Other Ambulatory Visit: Payer: Self-pay

## 2021-06-10 ENCOUNTER — Encounter (INDEPENDENT_AMBULATORY_CARE_PROVIDER_SITE_OTHER): Payer: Self-pay | Admitting: Ophthalmology

## 2021-06-10 ENCOUNTER — Ambulatory Visit (INDEPENDENT_AMBULATORY_CARE_PROVIDER_SITE_OTHER): Payer: HMO | Admitting: Ophthalmology

## 2021-06-10 DIAGNOSIS — H34831 Tributary (branch) retinal vein occlusion, right eye, with macular edema: Secondary | ICD-10-CM | POA: Diagnosis not present

## 2021-06-10 DIAGNOSIS — Z9989 Dependence on other enabling machines and devices: Secondary | ICD-10-CM | POA: Diagnosis not present

## 2021-06-10 DIAGNOSIS — G4733 Obstructive sleep apnea (adult) (pediatric): Secondary | ICD-10-CM

## 2021-06-10 MED ORDER — BEVACIZUMAB 2.5 MG/0.1ML IZ SOSY
2.5000 mg | PREFILLED_SYRINGE | INTRAVITREAL | Status: AC | PRN
Start: 2021-06-10 — End: 2021-06-10
  Administered 2021-06-10: 11:00:00 2.5 mg via INTRAVITREAL

## 2021-06-10 NOTE — Progress Notes (Signed)
06/10/2021     CHIEF COMPLAINT Patient presents for  Chief Complaint  Patient presents with   Retina Follow Up      HISTORY OF PRESENT ILLNESS: Virginia Huber is a 75 y.o. female who presents to the clinic today for:   HPI     Retina Follow Up           Diagnosis: CRVO/BRVO   Laterality: right eye   Onset: 5 weeks ago   Duration: 5 weeks   Course: gradually worsening         Comments   5 weeks fu OD, avastin OCT. Pt states "today when I woke up it seemed like I couldn't read the words on the t.v. as well. It was sort of foggy." Denies new FOL or floaters.      Last edited by Laurin Coder on 06/10/2021 10:31 AM.      Referring physician: Sharilyn Sites, MD [address] [place],  [place] [address]  HISTORICAL INFORMATION:   Selected notes from the MEDICAL RECORD NUMBER    Lab Results  Component Value Date   HGBA1C 7.5 (A) 06/06/2021     CURRENT MEDICATIONS: No current outpatient medications on file. (Ophthalmic Drugs)   No current facility-administered medications for this visit. (Ophthalmic Drugs)   Current Outpatient Medications (Other)  Medication Sig   acetaminophen (TYLENOL) 325 MG tablet Take 650 mg by mouth every 6 (six) hours as needed for headache.   amiodarone (PACERONE) 200 MG tablet Take 0.5 tablets (100 mg total) by mouth daily.   apixaban (ELIQUIS) 5 MG TABS tablet Take 1 tablet (5 mg total) by mouth 2 (two) times daily.   atorvastatin (LIPITOR) 80 MG tablet TAKE ONE TABLET BY MOUTH EVERY DAY AT 6:00PM   carvedilol (COREG) 25 MG tablet Take 12.5 mg by mouth 2 (two) times daily.   Cholecalciferol (VITAMIN D3 PO) Take 2,000 Units by mouth every evening.   clopidogrel (PLAVIX) 75 MG tablet TAKE ONE TABLET (75MG  TOTAL) BY MOUTH DAILY   Continuous Blood Gluc Receiver (FREESTYLE LIBRE 2 READER) DEVI 1 each by Does not apply route daily.   Continuous Blood Gluc Sensor (FREESTYLE LIBRE 2 SENSOR) MISC USE ONE FOR FOURTEEN DAYS    cyanocobalamin (,VITAMIN B-12,) 1000 MCG/ML injection Inject 1,000 mcg into the muscle every 30 (thirty) days. Last injection was around 08/02/19   diltiazem (TIAZAC) 360 MG 24 hr capsule Take 360 mg by mouth every morning.   furosemide (LASIX) 40 MG tablet TAKE ONE AND HALF TABLET (60MG  TOTAL) BY MOUTH IN THE MORNING AND ONE TABLET IN THE EVENING (40MG  TOTAL)   glucose blood (FREESTYLE PRECISION NEO TEST) test strip Use as instructed   hydrALAZINE (APRESOLINE) 50 MG tablet Take 1 tablet (50 mg total) by mouth 3 (three) times daily. (Patient taking differently: Take 50 mg by mouth 2 (two) times daily.)   insulin NPH-regular Human (NOVOLIN 70/30) (70-30) 100 UNIT/ML injection INJECT 35 UNITS BEFORE BREAKFAST AND 35 UNITS BEFORE SUPPER (Patient taking differently: 28-30 Units in the morning and at bedtime.)   levothyroxine (SYNTHROID) 125 MCG tablet Take 1 tablet (125 mcg total) by mouth daily.   NEOMYCIN-POLYMYXIN-HYDROCORTISONE (CORTISPORIN) 1 % SOLN OTIC solution Apply 1-2 drops to nail once daily after shower (Patient taking differently: 1-2 drops daily as needed (ingrown toe nail soreness).)   nitroGLYCERIN (NITROSTAT) 0.4 MG SL tablet Place 1 tablet (0.4 mg total) under the tongue every 5 (five) minutes as needed for chest pain.   olmesartan (  BENICAR) 40 MG tablet Take 40 mg by mouth in the morning.   potassium chloride SA (KLOR-CON) 20 MEQ tablet TAKE ONE (1) TABLET BY MOUTH EVERY DAY (Patient taking differently: 20 mEq every evening.)   rOPINIRole (REQUIP) 0.5 MG tablet Take 0.5 mg by mouth at bedtime.    Semaglutide, 2 MG/DOSE, 8 MG/3ML SOPN Inject 2 mg as directed once a week.   No current facility-administered medications for this visit. (Other)      REVIEW OF SYSTEMS: ROS   Negative for: Constitutional, Gastrointestinal, Neurological, Skin, Genitourinary, Musculoskeletal, HENT, Endocrine, Cardiovascular, Eyes, Respiratory, Psychiatric, Allergic/Imm, Heme/Lymph Last edited by Hurman Horn, MD on 06/10/2021 10:54 AM.       ALLERGIES Allergies  Allergen Reactions   Penicillins Hives    Has patient had a PCN reaction causing immediate rash, facial/tongue/throat swelling, SOB or lightheadedness with hypotension: Yes Has patient had a PCN reaction causing severe rash involving mucus membranes or skin necrosis: No Has patient had a PCN reaction that required hospitalization No Has patient had a PCN reaction occurring within the last 10 years: No If all of the above answers are "NO", then may proceed with Cephalosporin use.   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 h/o PAF/flutter during admission in 2013 for PNA. b. PAF during adm for NSTEMI 07/2015, subsequent paroxysms since then.   Atrial fibrillation with rapid ventricular response (HCC)    B12 deficiency anemia    Bradycardia 06/14/2016   Branch retinal vein occlusion with macular edema of right eye 10/11/2019   Bronchospasm 03/02/2012   CAD in native artery 11/30/2014   Chronic renal failure, stage 3b ([place]) 09/23/2017   Coronary artery disease 11/30/2014   a. remote MI. b. h/o PTCA with scoring balloon to OM1 11/2014. c. NSTEMI 03/2015 s/p DES to prox-mid Cx. d. NSTEMI 07/2015 s/p scoring balloon/PTCA/DES to dRCA with PAF during that admission   Coronary artery disease involving coronary bypass graft of native heart with unstable angina pectoris ([place])    Coronary artery  disease involving native coronary artery with other forms of angina pectoris    Cutaneous lupus erythematosus    Early stage nonexudative age-related macular degeneration of right eye 02/27/2020   Essential hypertension    FRACTURE, TOE 12/06/2007   Qualifier: Diagnosis of  By: Aline Brochure MD, Dorothyann Peng     GERD (gastroesophageal reflux disease)    History of blood transfusion 1980's   2nd surgical procedures   Hypercholesteremia    Hypokalemia 03/05/2012   Hypothyroidism    Multiple and bilateral precerebral artery syndromes 05/13/2016   Myocardial infarction ([place]) 02/2012   NSTEMI (non-ST elevated myocardial infarction) ([place]) 04/02/2015   OSA (obstructive sleep apnea) 05/13/2016   Ovarian tumor    PAD (peripheral artery disease) ([place])    a. s/p LE angio 2015; followed by Dr. Fletcher Anon - managed medically.   Pain with urination 05/08/2015   Paroxysmal atrial fibrillation (HCC) 12/08/2017   Pericardial effusion  a. 06/2016 after ppm - s/p pericardiocentesis.   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 s/p Medtronic PPM 06/2016, c/b lead perf/pericardial effusion.   Tamponade    TIA (transient ischemic attack)    Type 2 diabetes with nephropathy ([place]) 02/29/2012   Type II diabetes mellitus (HCC)    Typical atrial flutter (HCC)    UTI (urinary tract infection) 05/08/2013   Past Surgical History:  Procedure Laterality Date   ABDOMINAL AORTAGRAM N/A 01/03/2014   Procedure: ABDOMINAL [name];  Surgeon: Wellington Hampshire, MD;  Location: [facility] CATH [facility];  Service: Cardiovascular;   Laterality: N/A;   ABDOMINAL HYSTERECTOMY  06/22/1970   "partial"   APPENDECTOMY  02/20/1969   CARDIAC CATHETERIZATION  06/22/2006   Tiny OM-2 with 90% narrowing. Med tx.   CARDIAC CATHETERIZATION N/A 11/30/2014   Procedure: Left Heart Cath and Coronary Angiography;  Surgeon: Troy Sine, MD; LAD 20%, CFX 50%, OM1 95%, right PLB 30%, LV normal    CARDIAC CATHETERIZATION N/A 11/30/2014   Procedure: Coronary Balloon Angioplasty;  Surgeon: Troy Sine, MD;  Angiosculpt scoring balloon and PTCA to the OM1 reducing stenosis from 95% to less than 10%   CARDIAC CATHETERIZATION N/A 04/03/2015   Procedure: Left Heart Cath and Coronary Angiography;  Surgeon: Jolaine Artist, MD; dLAD 50%, CFX 90%, OM1 100%, PLA 15%, LVEDP 13     CARDIAC CATHETERIZATION N/A 04/03/2015   Procedure: Coronary Stent Intervention;  Surgeon: Sherren Mocha, MD; 3.0x18 mm Xience DES to the CFX     CARDIAC CATHETERIZATION N/A 08/02/2015   Procedure: Left Heart Cath and Coronary Angiography;  Surgeon: Troy Sine, MD;  Location: [facility] CV LAB;  Service: Cardiovascular;  Laterality: N/A;   CARDIAC CATHETERIZATION N/A 08/02/2015   Procedure: Coronary Stent Intervention;  Surgeon: Troy Sine, MD;  Location: [facility] CV LAB;  Service: Cardiovascular;  Laterality: N/A;   CARDIAC CATHETERIZATION N/A 06/25/2016   Procedure: Pericardiocentesis;  Surgeon: Will Meredith Leeds, MD;  Location: [facility];  Service: Cardiovascular;  Laterality: N/A;   cardiac stents     CARDIOVERSION N/A 12/15/2017   Procedure: CARDIOVERSION;  Surgeon: Acie Fredrickson Wonda Cheng, MD;  Location: [facility] ENDOSCOPY;  Service: Cardiovascular;  Laterality: N/A;   CHOLECYSTECTOMY OPEN  02/20/1989   COLONOSCOPY  06/23/2003   Dr. Laural Golden: pancolonic divericula, polyp, path unknown currently   COLONOSCOPY  06/22/2010   Dr. Oneida Alar: Normal TI, scattered diverticula in entire colon, small internal hemorrhoids, normal colon biopsies. Colonoscopy in  5-10 years.    COLOSTOMY  05/23/1979   COLOSTOMY REVERSAL  11/21/1979   EP IMPLANTABLE DEVICE N/A 06/25/2016   Procedure: Lead Revision/Repair;  Surgeon: Will Meredith Leeds, MD;  Location: [facility] CV LAB;  Service: Cardiovascular;  Laterality: N/A;   EP IMPLANTABLE DEVICE N/A 06/25/2016   Procedure: Pacemaker Implant;  Surgeon: Will Meredith Leeds, MD;  Location: [facility] CV LAB;  Service: Cardiovascular;  Laterality: N/A;   EXCISIONAL HEMORRHOIDECTOMY  02/20/1969   EYE SURGERY Left 06/22/1998   "branch vein occlusion"   EYE SURGERY Left ~ 2001   "  smoothed out wrinkle"   LEFT OOPHORECTOMY  05/23/1979   nicked bowel, peritonitis, colostomy; colostomy reversed 1981    LOWER EXTREMITY ANGIOGRAM N/A 01/03/2014   Procedure: LOWER EXTREMITY ANGIOGRAM;  Surgeon: Wellington Hampshire, MD;  Location: [facility] CATH LAB;  Service: Cardiovascular;  Laterality: N/A;   Nuclear med stress test  10/21/2011   Small area of mild ischemia inferoapically.   PARTIAL HYSTERECTOMY  02/20/1969   left ovaries, then ovaries removed later due tumors    right eye surgery  03/2021   RIGHT OOPHORECTOMY  02/20/1969    FAMILY HISTORY Family History  Problem Relation Age of Onset   Heart disease Mother        deceased   Heart disease Father        deceased, heart disease   Diabetes Brother    Heart disease Brother    Thyroid disease Brother    Heart disease Sister    Heart disease Brother    Thyroid disease Brother    Lupus Daughter    Colon cancer Neg Hx     SOCIAL HISTORY Social History   Tobacco Use   Smoking status: Never   Smokeless tobacco: Never   Tobacco comments:    Never smoked  Vaping Use   Vaping Use: Never used  Substance Use Topics   Alcohol use: No    Alcohol/week: 0.0 standard drinks   Drug use: No         OPHTHALMIC EXAM:  Base Eye Exam     Visual Acuity (ETDRS)       Right Left   Dist cc 20/60 CF at 1'   Dist ph cc NI NI    Correction: Glasses          Tonometry (Tonopen, 10:34 AM)       Right Left   Pressure 12 12         Pupils       Pupils Dark Light APD   Right PERRL 5 4 None   Left PERRL 5 4 None         Extraocular Movement       Right Left    Full Full         Neuro/Psych     Oriented x3: Yes   Mood/Affect: Normal         Dilation     Right eye: 1.0% Mydriacyl, 2.5% Phenylephrine @ 10:34 AM           Slit Lamp and Fundus Exam     External Exam       Right Left   External Normal Normal         Slit Lamp Exam       Right Left   Lids/Lashes Normal Normal   Conjunctiva/Sclera White and quiet White and quiet   Cornea Clear Clear   Anterior Chamber Deep and quiet Deep and quiet   Iris Round and reactive Round and reactive   Lens Posterior chamber intraocular lens Posterior chamber intraocular lens   Anterior Vitreous Normal, no [place] Normal         Fundus Exam       Right Left   Posterior Vitreous Clear and avitric, no debris visible    Disc Normal    C/D Ratio 0.2    Macula Hard drusen,   , Microaneurysms, Retinal pigment epithelial mottling, no hemorrhage, no exudates, Cystoid macular edema    Vessels Old branch retinal vein occlusion    Periphery Normal,  ,  no retinal hole, no retinal tear, good peripheral PRP sector             IMAGING AND PROCEDURES  Imaging and Procedures for 06/10/21  Intravitreal Injection, Pharmacologic Agent - OD - Right Eye       Time Out 06/10/2021. 10:54 AM. Confirmed correct patient, procedure, site, and patient consented.   Anesthesia Topical anesthesia was used. Anesthetic medications included Lidocaine 4%.   Procedure Preparation included 10% betadine to eyelids, 5% betadine to ocular surface, Tobramycin 0.3%. A 30 gauge needle was used.   Injection: 2.5 mg bevacizumab 2.5 MG/0.1ML   Route: Intravitreal, Site: Right Eye   NDC: 6107314716, Lot: [id number]   Post-op Post injection exam found visual acuity of at least counting  fingers. The patient tolerated the procedure well. There were no complications. The patient received written and verbal post procedure care education. Post injection medications included ocuflox.      OCT, Retina - OU - Both Eyes       Right Eye Quality was good. Scan locations included subfoveal. Central Foveal Thickness: 535. Progression has been stable. Findings include cystoid macular edema.   Left Eye Quality was good. Scan locations included subfoveal. Central Foveal Thickness: 268. Progression has been stable. Findings include subretinal scarring, disciform scar.   Notes Recurrent CME from BRVO, post vitrectomy for floaters, as well at 5-week interval.  Stable not improved CME  OS, no active maculopathy, cicatricial scarring Accounts for acuity             ASSESSMENT/PLAN:  OSA on CPAP Compliant with CPAP we will continue  Branch retinal vein occlusion with macular edema of right eye Macular CME OD, persistent 5 weeks.  Duration of effect may not last as long now postvitrectomy for severe floaters.  Repeat Avastin today follow-up again in 4 to 5 weeks, if no improvement next May consider change to [facility]  Patient to apply the good days     ICD-10-CM   1. Branch retinal vein occlusion with macular edema of right eye  H34.8310 Intravitreal Injection, Pharmacologic Agent - OD - Right Eye    OCT, Retina - OU - Both Eyes    bevacizumab (AVASTIN) SOSY 2.5 mg    2. OSA on CPAP  G47.33    Z99.89       1.  OD with persistent and now lingering CME from macular BRVO with impairment of acuity yet stable overall from last visit at 5 weeks.  Repeat injection today  2.  Dilate OD And likely repeat Avastin again if not improved next we will start to consider use of intravitreal Eylea  3.  Patient to apply to [facility]  Ophthalmic Meds Ordered this visit:  Meds ordered this encounter  Medications   bevacizumab (AVASTIN) SOSY 2.5 mg       Return in  about 5 weeks (around 07/15/2021) for dilate, OD, AVASTIN OCT.  There are no Patient Instructions on file for this visit.   Explained the diagnoses, plan, and follow up with the patient and they expressed understanding.  Patient expressed understanding of the importance of proper follow up care.   Clent Demark Adrick Kestler M.D. Diseases & Surgery of the Retina and Vitreous [facility] [place] 06/10/21     Abbreviations: M myopia (nearsighted); A astigmatism; H hyperopia (farsighted); P presbyopia; Mrx spectacle prescription;  CTL contact lenses; OD right eye; OS left eye; OU both eyes  XT exotropia; ET esotropia; PEK punctate epithelial keratitis; PEE  punctate epithelial erosions; DES dry eye syndrome; MGD meibomian gland dysfunction; ATs artificial tears; PFAT's preservative free artificial tears; [place] nuclear sclerotic cataract; PSC posterior subcapsular cataract; ERM epi-retinal membrane; PVD posterior vitreous detachment; RD retinal detachment; DM diabetes mellitus; DR diabetic retinopathy; NPDR non-proliferative diabetic retinopathy; PDR proliferative diabetic retinopathy; CSME clinically significant macular edema; DME diabetic macular edema; dbh dot blot hemorrhages; CWS cotton wool spot; POAG primary open angle glaucoma; C/D cup-to-disc ratio; HVF humphrey visual field; GVF goldmann visual field; OCT optical coherence tomography; IOP intraocular pressure; BRVO Branch retinal vein occlusion; CRVO central retinal vein occlusion; CRAO central retinal artery occlusion; BRAO branch retinal artery occlusion; RT retinal tear; SB scleral buckle; PPV pars plana vitrectomy; VH Vitreous hemorrhage; PRP panretinal laser photocoagulation; IVK intravitreal kenalog; VMT vitreomacular traction; MH Macular hole;  NVD neovascularization of the disc; NVE neovascularization elsewhere; AREDS age related eye disease study; ARMD age related macular degeneration; POAG primary open angle glaucoma; EBMD epithelial/anterior  basement membrane dystrophy; ACIOL anterior chamber intraocular lens; IOL intraocular lens; PCIOL posterior chamber intraocular lens; Phaco/IOL phacoemulsification with intraocular lens placement; [place] photorefractive keratectomy; LASIK laser assisted in situ keratomileusis; HTN hypertension; DM diabetes mellitus; COPD chronic obstructive pulmonary disease

## 2021-06-10 NOTE — Assessment & Plan Note (Addendum)
Macular CME OD, persistent 5 weeks.  Duration of effect may not last as long now postvitrectomy for severe floaters.  Repeat Avastin today follow-up again in 4 to 5 weeks, if no improvement next May consider change to [facility]  Patient to apply the good days

## 2021-06-10 NOTE — Assessment & Plan Note (Signed)
Compliant with CPAP we will continue

## 2021-06-11 ENCOUNTER — Encounter (INDEPENDENT_AMBULATORY_CARE_PROVIDER_SITE_OTHER): Payer: HMO | Admitting: Ophthalmology

## 2021-06-11 DIAGNOSIS — I129 Hypertensive chronic kidney disease with stage 1 through stage 4 chronic kidney disease, or unspecified chronic kidney disease: Secondary | ICD-10-CM | POA: Diagnosis not present

## 2021-06-11 DIAGNOSIS — N189 Chronic kidney disease, unspecified: Secondary | ICD-10-CM | POA: Diagnosis not present

## 2021-06-11 DIAGNOSIS — E211 Secondary hyperparathyroidism, not elsewhere classified: Secondary | ICD-10-CM | POA: Diagnosis not present

## 2021-06-11 DIAGNOSIS — E559 Vitamin D deficiency, unspecified: Secondary | ICD-10-CM | POA: Diagnosis not present

## 2021-06-11 DIAGNOSIS — E1129 Type 2 diabetes mellitus with other diabetic kidney complication: Secondary | ICD-10-CM | POA: Diagnosis not present

## 2021-06-11 DIAGNOSIS — I5032 Chronic diastolic (congestive) heart failure: Secondary | ICD-10-CM | POA: Diagnosis not present

## 2021-06-11 DIAGNOSIS — E1122 Type 2 diabetes mellitus with diabetic chronic kidney disease: Secondary | ICD-10-CM | POA: Diagnosis not present

## 2021-06-11 DIAGNOSIS — R809 Proteinuria, unspecified: Secondary | ICD-10-CM | POA: Diagnosis not present

## 2021-06-17 ENCOUNTER — Encounter (HOSPITAL_COMMUNITY): Payer: Self-pay | Admitting: Internal Medicine

## 2021-07-02 ENCOUNTER — Encounter: Payer: Self-pay | Admitting: Gastroenterology

## 2021-07-06 ENCOUNTER — Other Ambulatory Visit: Payer: Self-pay | Admitting: Internal Medicine

## 2021-07-09 ENCOUNTER — Ambulatory Visit (INDEPENDENT_AMBULATORY_CARE_PROVIDER_SITE_OTHER): Payer: HMO

## 2021-07-09 DIAGNOSIS — I495 Sick sinus syndrome: Secondary | ICD-10-CM

## 2021-07-09 LAB — CUP PACEART REMOTE DEVICE CHECK
Battery Remaining Longevity: 66 mo
Battery Voltage: 3 V
Brady Statistic AP VP Percent: 0.03 %
Brady Statistic AP VS Percent: 36.52 %
Brady Statistic AS VP Percent: 0.16 %
Brady Statistic AS VS Percent: 63.29 %
Brady Statistic RA Percent Paced: 36.53 %
Brady Statistic RV Percent Paced: 0.19 %
Date Time Interrogation Session: 20230118094907
Implantable Lead Implant Date: 20180104
Implantable Lead Implant Date: 20180104
Implantable Lead Location: 753859
Implantable Lead Location: 753860
Implantable Lead Model: 5076
Implantable Lead Model: 5076
Implantable Pulse Generator Implant Date: 20180104
Lead Channel Impedance Value: 342 Ohm
Lead Channel Impedance Value: 380 Ohm
Lead Channel Impedance Value: 380 Ohm
Lead Channel Impedance Value: 456 Ohm
Lead Channel Pacing Threshold Amplitude: 0.625 V
Lead Channel Pacing Threshold Amplitude: 1.25 V
Lead Channel Pacing Threshold Pulse Width: 0.4 ms
Lead Channel Pacing Threshold Pulse Width: 0.4 ms
Lead Channel Sensing Intrinsic Amplitude: 1.625 mV
Lead Channel Sensing Intrinsic Amplitude: 1.625 mV
Lead Channel Sensing Intrinsic Amplitude: 11.75 mV
Lead Channel Sensing Intrinsic Amplitude: 11.75 mV
Lead Channel Setting Pacing Amplitude: 2 V
Lead Channel Setting Pacing Amplitude: 2.5 V
Lead Channel Setting Pacing Pulse Width: 0.4 ms
Lead Channel Setting Sensing Sensitivity: 2.8 mV

## 2021-07-16 ENCOUNTER — Other Ambulatory Visit: Payer: Self-pay

## 2021-07-16 ENCOUNTER — Ambulatory Visit (INDEPENDENT_AMBULATORY_CARE_PROVIDER_SITE_OTHER): Payer: HMO | Admitting: Ophthalmology

## 2021-07-16 ENCOUNTER — Encounter (INDEPENDENT_AMBULATORY_CARE_PROVIDER_SITE_OTHER): Payer: Self-pay | Admitting: Ophthalmology

## 2021-07-16 DIAGNOSIS — E113411 Type 2 diabetes mellitus with severe nonproliferative diabetic retinopathy with macular edema, right eye: Secondary | ICD-10-CM | POA: Diagnosis not present

## 2021-07-16 DIAGNOSIS — H34831 Tributary (branch) retinal vein occlusion, right eye, with macular edema: Secondary | ICD-10-CM

## 2021-07-16 DIAGNOSIS — H348122 Central retinal vein occlusion, left eye, stable: Secondary | ICD-10-CM | POA: Diagnosis not present

## 2021-07-16 MED ORDER — BEVACIZUMAB 2.5 MG/0.1ML IZ SOSY
2.5000 mg | PREFILLED_SYRINGE | INTRAVITREAL | Status: AC | PRN
  Administered 2021-07-16: 11:00:00 2.5 mg via INTRAVITREAL

## 2021-07-16 NOTE — Assessment & Plan Note (Signed)
Component of disease making this somewhat more resistant with superimposed diabetic retinopathy

## 2021-07-16 NOTE — Assessment & Plan Note (Signed)
Persistent and increasing severe CSME OD, well controlled today with Avastin and change to Eylea OD next injection

## 2021-07-16 NOTE — Progress Notes (Signed)
07/16/2021     CHIEF COMPLAINT Patient presents for  Chief Complaint  Patient presents with   Retina Follow Up      HISTORY OF PRESENT ILLNESS: Virginia Huber is a 76 y.o. female who presents to the clinic today for:   HPI     Retina Follow Up           Diagnosis: CRVO/BRVO   Laterality: right eye   Onset: 5 weeks ago   Severity: mild   Duration: 5 weeks   Course: gradually worsening         Comments   5 weeks dilate OD, Avastin OD OCT. Pt states "within the last week or so I have noticed a change in my vision, I guess it is my right eye. Like looking at [facility] letters or anything it looks like part of words fade out, like it gets real blurry." Denies new FOL or floaters.      Last edited by Laurin Coder on 07/16/2021  9:45 AM.      Referring physician: Sharilyn Sites, MD [address] [place],  [place] [address]  HISTORICAL INFORMATION:   Selected notes from the MEDICAL RECORD NUMBER    Lab Results  Component Value Date   HGBA1C 7.5 (A) 06/06/2021     CURRENT MEDICATIONS: No current outpatient medications on file. (Ophthalmic Drugs)   No current facility-administered medications for this visit. (Ophthalmic Drugs)   Current Outpatient Medications (Other)  Medication Sig   acetaminophen (TYLENOL) 325 MG tablet Take 650 mg by mouth every 6 (six) hours as needed for headache.   amiodarone (PACERONE) 200 MG tablet Take 0.5 tablets (100 mg total) by mouth daily.   apixaban (ELIQUIS) 5 MG TABS tablet Take 1 tablet (5 mg total) by mouth 2 (two) times daily.   atorvastatin (LIPITOR) 80 MG tablet TAKE ONE TABLET BY MOUTH EVERY DAY AT 6:00PM   carvedilol (COREG) 25 MG tablet Take 12.5 mg by mouth 2 (two) times daily.   Cholecalciferol (VITAMIN D3 PO) Take 2,000 Units by mouth every evening.   clopidogrel (PLAVIX) 75 MG tablet TAKE ONE TABLET (75MG  TOTAL) BY MOUTH DAILY   Continuous Blood Gluc Receiver (FREESTYLE LIBRE 2 READER) DEVI 1 each by Does not  apply route daily.   Continuous Blood Gluc Sensor (FREESTYLE LIBRE 2 SENSOR) MISC USE ONE FOR FOURTEEN DAYS   cyanocobalamin (,VITAMIN B-12,) 1000 MCG/ML injection Inject 1,000 mcg into the muscle every 30 (thirty) days. Last injection was around 08/02/19   diltiazem (TIAZAC) 360 MG 24 hr capsule Take 360 mg by mouth every morning.   furosemide (LASIX) 40 MG tablet TAKE ONE AND HALF TABLET (60MG  TOTAL) BY MOUTH IN THE MORNING AND ONE TABLET IN THE EVENING (40MG  TOTAL)   glucose blood (FREESTYLE PRECISION NEO TEST) test strip Use as instructed   hydrALAZINE (APRESOLINE) 50 MG tablet Take 1 tablet (50 mg total) by mouth 3 (three) times daily. (Patient taking differently: Take 50 mg by mouth 2 (two) times daily.)   insulin NPH-regular Human (NOVOLIN 70/30) (70-30) 100 UNIT/ML injection INJECT 35 UNITS BEFORE BREAKFAST AND 35 UNITS BEFORE SUPPER (Patient taking differently: 28-30 Units in the morning and at bedtime.)   levothyroxine (SYNTHROID) 125 MCG tablet TAKE ONE TABLET BY MOUTH ONCE DAILY *New DOSE   NEOMYCIN-POLYMYXIN-HYDROCORTISONE (CORTISPORIN) 1 % SOLN OTIC solution Apply 1-2 drops to nail once daily after shower (Patient taking differently: 1-2 drops daily as needed (ingrown toe nail soreness).)   nitroGLYCERIN (NITROSTAT) 0.4  MG SL tablet Place 1 tablet (0.4 mg total) under the tongue every 5 (five) minutes as needed for chest pain.   olmesartan (BENICAR) 40 MG tablet Take 40 mg by mouth in the morning.   potassium chloride SA (KLOR-CON) 20 MEQ tablet TAKE ONE (1) TABLET BY MOUTH EVERY DAY (Patient taking differently: 20 mEq every evening.)   rOPINIRole (REQUIP) 0.5 MG tablet Take 0.5 mg by mouth at bedtime.    Semaglutide, 2 MG/DOSE, 8 MG/3ML SOPN Inject 2 mg as directed once a week.   No current facility-administered medications for this visit. (Other)      REVIEW OF SYSTEMS: ROS   Negative for: Constitutional, Gastrointestinal, Neurological, Skin, Genitourinary, Musculoskeletal,  HENT, Endocrine, Cardiovascular, Eyes, Respiratory, Psychiatric, Allergic/Imm, Heme/Lymph Last edited by Hurman Horn, MD on 07/16/2021 10:54 AM.       ALLERGIES Allergies  Allergen Reactions   Penicillins Hives    Has patient had a PCN reaction causing immediate rash, facial/tongue/throat swelling, SOB or lightheadedness with hypotension: Yes Has patient had a PCN reaction causing severe rash involving mucus membranes or skin necrosis: No Has patient had a PCN reaction that required hospitalization No Has patient had a PCN reaction occurring within the last 10 years: No If all of the above answers are "NO", then may proceed with Cephalosporin use.   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 h/o PAF/flutter during admission in 2013 for PNA. b. PAF during adm for NSTEMI 07/2015, subsequent paroxysms since then.   Atrial fibrillation with rapid ventricular response (HCC)    B12 deficiency anemia    Bradycardia 06/14/2016   Branch retinal vein occlusion with macular edema of right eye 10/11/2019   Bronchospasm 03/02/2012   CAD in native artery 11/30/2014   Chronic renal failure, stage 3b ([place]) 09/23/2017   Coronary artery disease 11/30/2014   a. remote MI. b. h/o PTCA with scoring balloon to OM1 11/2014. c. NSTEMI 03/2015 s/p DES to prox-mid Cx. d. 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 s/p LE angio 2015; followed  by Dr. Fletcher Anon - managed medically.   Pain with urination 05/08/2015   Paroxysmal atrial fibrillation ([place]) 12/08/2017   Pericardial effusion    a. 06/2016 after ppm - s/p pericardiocentesis.   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 s/p Medtronic PPM 06/2016, c/b lead perf/pericardial effusion.   Tamponade    TIA (transient ischemic attack)    Type 2 diabetes with nephropathy ([place]) 02/29/2012   Type II diabetes mellitus (HCC)    Typical atrial flutter (HCC)    UTI (urinary tract infection) 05/08/2013   Past Surgical History:  Procedure Laterality Date   ABDOMINAL AORTAGRAM N/A 01/03/2014    Procedure: ABDOMINAL [name];  Surgeon: Wellington Hampshire, MD;  Location: [facility] CATH LAB;  Service: Cardiovascular;  Laterality: N/A;   ABDOMINAL HYSTERECTOMY  06/22/1970   "partial"   APPENDECTOMY  02/20/1969   CARDIAC CATHETERIZATION  06/22/2006   Tiny OM-2 with 90% narrowing. Med tx.   CARDIAC CATHETERIZATION N/A 11/30/2014   Procedure: Left Heart Cath and Coronary Angiography;  Surgeon: Troy Sine, MD; LAD 20%, CFX 50%, OM1 95%, right PLB 30%, LV normal    CARDIAC CATHETERIZATION N/A 11/30/2014   Procedure: Coronary Balloon Angioplasty;  Surgeon: Troy Sine, MD;  Angiosculpt scoring balloon and PTCA to the OM1 reducing stenosis from 95% to less than 10%   CARDIAC CATHETERIZATION N/A 04/03/2015   Procedure: Left Heart Cath and Coronary Angiography;  Surgeon: Jolaine Artist, MD; dLAD 50%, CFX 90%, OM1 100%, PLA 15%, LVEDP 13     CARDIAC CATHETERIZATION N/A 04/03/2015   Procedure: Coronary Stent Intervention;  Surgeon: Sherren Mocha, MD; 3.0x18 mm Xience DES to the CFX     CARDIAC CATHETERIZATION N/A 08/02/2015   Procedure: Left Heart Cath and Coronary Angiography;  Surgeon: Troy Sine, MD;  Location: [facility] CV LAB;  Service: Cardiovascular;  Laterality: N/A;   CARDIAC CATHETERIZATION N/A 08/02/2015   Procedure: Coronary Stent Intervention;  Surgeon: Troy Sine, MD;  Location: [facility] LAB;  Service: Cardiovascular;  Laterality: N/A;   CARDIAC CATHETERIZATION N/A 06/25/2016   Procedure: Pericardiocentesis;  Surgeon: Will Meredith Leeds, MD;  Location: [facility] CV LAB;  Service: Cardiovascular;  Laterality: N/A;   cardiac stents     CARDIOVERSION N/A 12/15/2017   Procedure: CARDIOVERSION;  Surgeon: Acie Fredrickson Wonda Cheng, MD;  Location: [facility] ENDOSCOPY;  Service: Cardiovascular;  Laterality: N/A;   CHOLECYSTECTOMY OPEN  02/20/1989   COLONOSCOPY  06/23/2003   Dr. Laural Golden: pancolonic divericula, polyp, path unknown currently   COLONOSCOPY  06/22/2010   Dr. Oneida Alar:  Normal TI, scattered diverticula in entire colon, small internal hemorrhoids, normal colon biopsies. Colonoscopy in 5-10 years.    COLONOSCOPY WITH PROPOFOL N/A 06/02/2021   Procedure: COLONOSCOPY WITH PROPOFOL;  Surgeon: Eloise Harman, DO;  [facility];  Service: Endoscopy;  Laterality: N/A;  8:30am   COLOSTOMY  05/23/1979   COLOSTOMY REVERSAL  11/21/1979   EP IMPLANTABLE DEVICE N/A 06/25/2016   Procedure: Lead Revision/Repair;  Surgeon: Will Meredith Leeds, MD;  Location: [place] [facility] CV LAB;  Service: Cardiovascular;  Laterality: N/A;   EP IMPLANTABLE DEVICE N/A 06/25/2016   Procedure: Pacemaker Implant;  Surgeon: Will Meredith Leeds, MD;  Location: [facility] CV LAB;  Service: Cardiovascular;  Laterality: N/A;   EXCISIONAL HEMORRHOIDECTOMY  02/20/1969   EYE SURGERY Left 06/22/1998   "branch vein occlusion"   EYE SURGERY Left ~ 2001   "smoothed out wrinkle"   LEFT OOPHORECTOMY  05/23/1979   nicked bowel, peritonitis, colostomy; colostomy reversed 1981    LOWER EXTREMITY ANGIOGRAM N/A 01/03/2014   Procedure: LOWER EXTREMITY ANGIOGRAM;  Surgeon: Wellington Hampshire, MD;  Location: [facility] CATH LAB;  Service: Cardiovascular;  Laterality: N/A;   Nuclear med stress test  10/21/2011   Small area of mild ischemia inferoapically.   PARTIAL HYSTERECTOMY  02/20/1969   left ovaries, then ovaries removed later due tumors    POLYPECTOMY  06/02/2021   Procedure: POLYPECTOMY;  Surgeon: Eloise Harman, DO;  [facility]: [facility];  Service: Endoscopy;;   right eye surgery  03/2021   RIGHT OOPHORECTOMY  02/20/1969    FAMILY HISTORY Family History  Problem Relation Age of Onset   Heart disease Mother        deceased   Heart disease Father        deceased, heart disease   Diabetes Brother    Heart disease Brother    Thyroid disease Brother    Heart disease Sister    Heart disease Brother    Thyroid disease Brother    Lupus Daughter    Colon cancer Neg Hx     SOCIAL  HISTORY Social History   Tobacco Use   Smoking status: Never   Smokeless tobacco: Never   Tobacco comments:    Never smoked  Vaping Use   Vaping Use: Never used  Substance Use Topics   Alcohol use: No    Alcohol/week: 0.0 standard drinks   Drug use: No         OPHTHALMIC EXAM:  Base Eye Exam     Visual Acuity (ETDRS)       Right Left   Dist cc 20/60 -2 CF at 1'   Dist ph cc NI NI         Tonometry (Tonopen, 9:48 AM)       Right Left   Pressure 11 9         Pupils       Dark Light Shape APD   Right 5 4  None   Left 5 4 Irregular None         Extraocular Movement       Right Left    Full Full         Neuro/Psych     Oriented x3: Yes   Mood/Affect: Normal         Dilation     Right eye: 1.0% Mydriacyl, 2.5% Phenylephrine @ 9:48 AM           Slit Lamp and Fundus Exam     External Exam       Right Left   External Normal Normal         Slit Lamp Exam       Right Left   Lids/Lashes Normal Normal   Conjunctiva/Sclera White and quiet White and quiet   Cornea Clear Clear   Anterior Chamber Deep and quiet Deep and quiet   Iris Round and reactive Round and reactive   Lens Posterior chamber intraocular lens Posterior chamber intraocular lens   Anterior Vitreous Normal,  no [place] Normal         Fundus Exam       Right Left   Posterior Vitreous Clear and avitric, no debris visible    Disc Normal    C/D Ratio 0.2    Macula Hard drusen, , Microaneurysms, Retinal pigment epithelial mottling, no hemorrhage, no exudates, Cystoid macular edema    Vessels Old branch retinal vein occlusion    Periphery Normal,  , no retinal hole, no retinal tear, good peripheral PRP sector             IMAGING AND PROCEDURES  Imaging and Procedures for 07/16/21  Intravitreal Injection, Pharmacologic Agent - OD - Right Eye       Time Out 07/16/2021. 10:57 AM. Confirmed correct patient, procedure, site, and patient consented.    Anesthesia Topical anesthesia was used. Anesthetic medications included Lidocaine 4%.   Procedure Preparation included 10% betadine to eyelids, 5% betadine to ocular surface, Tobramycin 0.3%. A 30 gauge needle was used.   Injection: 2.5 mg bevacizumab 2.5 MG/0.1ML   Route: Intravitreal, Site: Right Eye   NDC: [phone], Lot: [id number]   Post-op Post injection exam found visual acuity of at least counting fingers. The patient tolerated the procedure well. There were no complications. The patient received written and verbal post procedure care education. Post injection medications included ocuflox.      OCT, Retina - OU - Both Eyes       Right Eye Quality was good. Scan locations included subfoveal. Central Foveal Thickness: 615. Progression has been stable. Findings include cystoid macular edema.   Left Eye Quality was good. Scan locations included subfoveal. Central Foveal Thickness: 222. Progression has been stable. Findings include subretinal scarring, disciform scar.   Notes Recurrent CME from BRVO, with underlying diabetic retinopathy post vitrectomy for floaters, as well at 5-week interval.  Increased CME.  OS, no active maculopathy, cicatricial scarring Accounts for acuity             ASSESSMENT/PLAN:  Severe nonproliferative diabetic retinopathy of right eye, with macular edema, associated with type 2 diabetes mellitus (HCC) Persistent and increasing severe CSME OD, well controlled today with Avastin and change to Eylea OD next injection  Stable central retinal vein occlusion of left eye No active edema OS by OCT  Branch retinal vein occlusion with macular edema of right eye Component of disease making this somewhat more resistant with superimposed diabetic retinopathy     ICD-10-CM   1. Branch retinal vein occlusion with macular edema of right eye  H34.8310 Intravitreal Injection, Pharmacologic Agent - OD - Right Eye    OCT, Retina - OU - Both Eyes     bevacizumab (AVASTIN) SOSY 2.5 mg    2. Severe nonproliferative diabetic retinopathy of right eye, with macular edema, associated with type 2 diabetes mellitus ([place])  E11.3411     3. Stable central retinal vein occlusion of left eye  H34.8122       1.  OD, with persistent and increasing CME likely from BRVO superimposed upon underlying severe NPDR of diabetes.  We will treat today again with Avastin to maintain and follow-up close to 1 month for commencement of use of Eylea injection OD  2.  3.  Ophthalmic Meds Ordered this visit:  Meds ordered this encounter  Medications   bevacizumab (AVASTIN) SOSY 2.5 mg       Return in about 1 month (around 08/16/2021) for dilate, OD, EYLEA OCT,, already approved for good days.  There  are no Patient Instructions on file for this visit.   Explained the diagnoses, plan, and follow up with the patient and they expressed understanding.  Patient expressed understanding of the importance of proper follow up care.   Clent Demark Paije Goodhart M.D. Diseases & Surgery of the Retina and Vitreous [facility] Diabetic [place] 07/16/21     Abbreviations: M myopia (nearsighted); A astigmatism; H hyperopia (farsighted); P presbyopia; Mrx spectacle prescription;  CTL contact lenses; OD right eye; OS left eye; OU both eyes  XT exotropia; ET esotropia; PEK punctate epithelial keratitis; PEE punctate epithelial erosions; DES dry eye syndrome; MGD meibomian gland dysfunction; ATs artificial tears; PFAT's preservative free artificial tears; [place] nuclear sclerotic cataract; PSC posterior subcapsular cataract; ERM epi-retinal membrane; PVD posterior vitreous detachment; RD retinal detachment; DM diabetes mellitus; DR diabetic retinopathy; NPDR non-proliferative diabetic retinopathy; PDR proliferative diabetic retinopathy; CSME clinically significant macular edema; DME diabetic macular edema; dbh dot blot hemorrhages; CWS cotton wool spot; POAG primary open angle glaucoma; C/D  cup-to-disc ratio; HVF humphrey visual field; GVF goldmann visual field; OCT optical coherence tomography; IOP intraocular pressure; BRVO Branch retinal vein occlusion; CRVO central retinal vein occlusion; CRAO central retinal artery occlusion; BRAO branch retinal artery occlusion; RT retinal tear; SB scleral buckle; PPV pars plana vitrectomy; VH Vitreous hemorrhage; PRP panretinal laser photocoagulation; IVK intravitreal kenalog; VMT vitreomacular traction; MH Macular hole;  NVD neovascularization of the disc; NVE neovascularization elsewhere; AREDS age related eye disease study; ARMD age related macular degeneration; POAG primary open angle glaucoma; EBMD epithelial/anterior basement membrane dystrophy; ACIOL anterior chamber intraocular lens; IOL intraocular lens; PCIOL posterior chamber intraocular lens; Phaco/IOL phacoemulsification with intraocular lens placement; [place] photorefractive keratectomy; LASIK laser assisted in situ keratomileusis; HTN hypertension; DM diabetes mellitus; COPD chronic obstructive pulmonary disease

## 2021-07-16 NOTE — Assessment & Plan Note (Signed)
No active edema OS by OCT

## 2021-07-18 DIAGNOSIS — I130 Hypertensive heart and chronic kidney disease with heart failure and stage 1 through stage 4 chronic kidney disease, or unspecified chronic kidney disease: Secondary | ICD-10-CM | POA: Diagnosis not present

## 2021-07-18 DIAGNOSIS — D649 Anemia, unspecified: Secondary | ICD-10-CM | POA: Diagnosis not present

## 2021-07-18 DIAGNOSIS — E1122 Type 2 diabetes mellitus with diabetic chronic kidney disease: Secondary | ICD-10-CM | POA: Diagnosis not present

## 2021-07-18 DIAGNOSIS — Z1331 Encounter for screening for depression: Secondary | ICD-10-CM | POA: Diagnosis not present

## 2021-07-18 DIAGNOSIS — E663 Overweight: Secondary | ICD-10-CM | POA: Diagnosis not present

## 2021-07-18 DIAGNOSIS — E039 Hypothyroidism, unspecified: Secondary | ICD-10-CM | POA: Diagnosis not present

## 2021-07-18 DIAGNOSIS — Z6826 Body mass index (BMI) 26.0-26.9, adult: Secondary | ICD-10-CM | POA: Diagnosis not present

## 2021-07-18 DIAGNOSIS — I10 Essential (primary) hypertension: Secondary | ICD-10-CM | POA: Diagnosis not present

## 2021-07-18 DIAGNOSIS — E782 Mixed hyperlipidemia: Secondary | ICD-10-CM | POA: Diagnosis not present

## 2021-07-18 DIAGNOSIS — Z0000 Encounter for general adult medical examination without abnormal findings: Secondary | ICD-10-CM | POA: Diagnosis not present

## 2021-07-18 DIAGNOSIS — E538 Deficiency of other specified B group vitamins: Secondary | ICD-10-CM | POA: Diagnosis not present

## 2021-07-22 DIAGNOSIS — E1122 Type 2 diabetes mellitus with diabetic chronic kidney disease: Secondary | ICD-10-CM | POA: Diagnosis not present

## 2021-07-22 DIAGNOSIS — I130 Hypertensive heart and chronic kidney disease with heart failure and stage 1 through stage 4 chronic kidney disease, or unspecified chronic kidney disease: Secondary | ICD-10-CM | POA: Diagnosis not present

## 2021-07-22 DIAGNOSIS — I509 Heart failure, unspecified: Secondary | ICD-10-CM | POA: Diagnosis not present

## 2021-07-22 DIAGNOSIS — N184 Chronic kidney disease, stage 4 (severe): Secondary | ICD-10-CM | POA: Diagnosis not present

## 2021-07-22 NOTE — Progress Notes (Signed)
Remote pacemaker transmission.   

## 2021-07-25 ENCOUNTER — Other Ambulatory Visit (HOSPITAL_COMMUNITY): Payer: Self-pay | Admitting: Adult Health

## 2021-07-25 DIAGNOSIS — Z1231 Encounter for screening mammogram for malignant neoplasm of breast: Secondary | ICD-10-CM

## 2021-07-27 NOTE — Progress Notes (Signed)
Cardiology Office Note  Date: 07/28/2021   ID: Virginia, Huber 12-14-1945, MRN [id number]  PCP:  Sharilyn Sites, MD  Cardiologist:  Rozann Lesches, MD Electrophysiologist:  Constance Haw, MD   Chief Complaint  Patient presents with   Cardiac follow-up    History of Present Illness: Virginia Huber is a 76 y.o. female last seen in August 2022 by Ms. Strader PA-C.  She is here for a routine visit.  States that she has done fairly well, does have episodes of lightheadedness that are somewhat sporadic, not necessarily after standing, not associated with palpitations.  She has checked her blood pressure and at times systolic has been in the 78G when this happens.  In general her systolic is around 956-213 at home.  She has a Medtronic pacemaker in place, last follow-up by Dr. Curt Bears.  Device interrogation in January revealed less than 1% AF burden with otherwise normal device function.  I reviewed her medications which are noted below.  She reports compliance with therapy, no spontaneous bleeding problems on Eliquis and Plavix.  She also remains on low-dose amiodarone, TSH and LFTs normal within the last 6 months.  She also had recent lab work with PCP which we are requesting for review.  She has lost about 5 pounds on Ozempic.  Does not report any orthopnea or PND, no leg swelling.  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 h/o PAF/flutter during admission in 2013 for PNA. b. PAF during adm for NSTEMI 07/2015, subsequent paroxysms since then.   B12 deficiency anemia    Branch retinal vein occlusion with macular edema of right eye 10/11/2019   Chronic renal failure, stage  3b ([place]) 09/23/2017   Coronary artery disease 11/30/2014   a. remote MI. b. h/o PTCA with scoring balloon to OM1 11/2014. c. NSTEMI 03/2015 s/p DES to prox-mid Cx. d. NSTEMI 07/2015 s/p scoring balloon/PTCA/DES to dRCA with PAF during that admission   Cutaneous lupus erythematosus    Early stage nonexudative age-related macular degeneration of right eye 02/27/2020   Essential hypertension    GERD (gastroesophageal reflux disease)    History of blood transfusion 1980's   2nd surgical procedures   Hypercholesteremia    Hypothyroidism    Myocardial infarction ([place]) 02/2012   NSTEMI (non-ST elevated myocardial infarction) ([place]) 04/02/2015   OSA (obstructive sleep apnea) 05/13/2016   Ovarian tumor    PAD (peripheral artery disease) ([place])    a. s/p LE angio 2015; followed by Dr. Fletcher Anon - managed medically.   Pericardial effusion    a. 06/2016 after ppm - s/p pericardiocentesis.   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 s/p Medtronic PPM 06/2016, c/b lead perf/pericardial effusion.   TIA (transient ischemic attack)    Type 2 diabetes with nephropathy ([place]) 02/29/2012    Past Surgical History:  Procedure Laterality Date   ABDOMINAL AORTAGRAM N/A 01/03/2014   Procedure: ABDOMINAL [name];  Surgeon: Rogue Jury  Ferne Reus, MD;  Location: [facility];  Service: Cardiovascular;  Laterality: N/A;   ABDOMINAL HYSTERECTOMY  06/22/1970   "partial"   APPENDECTOMY  02/20/1969   CARDIAC CATHETERIZATION  06/22/2006   Tiny OM-2 with 90% narrowing. Med tx.   CARDIAC CATHETERIZATION N/A 11/30/2014   Procedure: Left Heart Cath and Coronary Angiography;  Surgeon: Troy Sine, MD; LAD 20%, CFX 50%, OM1 95%, right PLB 30%, LV normal    CARDIAC  CATHETERIZATION N/A 11/30/2014   Procedure: Coronary Balloon Angioplasty;  Surgeon: Troy Sine, MD;  Angiosculpt scoring balloon and PTCA to the OM1 reducing stenosis from 95% to less than 10%   CARDIAC CATHETERIZATION N/A 04/03/2015   Procedure: Left Heart Cath and Coronary Angiography;  Surgeon: Jolaine Artist, MD; dLAD 50%, CFX 90%, OM1 100%, PLA 15%, LVEDP 13     CARDIAC CATHETERIZATION N/A 04/03/2015   Procedure: Coronary Stent Intervention;  Surgeon: Sherren Mocha, MD; 3.0x18 mm Xience DES to the CFX     CARDIAC CATHETERIZATION N/A 08/02/2015   Procedure: Left Heart Cath and Coronary Angiography;  Surgeon: Troy Sine, MD;  Location: [facility];  Service: Cardiovascular;  Laterality: N/A;   CARDIAC CATHETERIZATION N/A 08/02/2015   Procedure: Coronary Stent Intervention;  Surgeon: Troy Sine, MD;  Location: [facility];  Service: Cardiovascular;  Laterality: N/A;   CARDIAC CATHETERIZATION N/A 06/25/2016   Procedure: Pericardiocentesis;  Surgeon: Will Meredith Leeds, MD;  Location: [facility];  Service: Cardiovascular;  Laterality: N/A;   cardiac stents     CARDIOVERSION N/A 12/15/2017   Procedure: CARDIOVERSION;  Surgeon: Acie Fredrickson Wonda Cheng, MD;  Location: [facility] ENDOSCOPY;  Service: Cardiovascular;  Laterality: N/A;   CHOLECYSTECTOMY OPEN  02/20/1989   COLONOSCOPY  06/23/2003   Dr. Laural Golden: pancolonic divericula, polyp, path unknown currently   COLONOSCOPY  06/22/2010   Dr. Oneida Alar: Normal TI, scattered diverticula in entire colon, small internal hemorrhoids, normal colon biopsies. Colonoscopy in 5-10 years.    COLONOSCOPY WITH PROPOFOL N/A 06/02/2021   Procedure: COLONOSCOPY WITH PROPOFOL;  Surgeon: Eloise Harman, DO;  [facility];  Service: Endoscopy;  Laterality: N/A;  8:30am   COLOSTOMY  05/23/1979   COLOSTOMY REVERSAL  11/21/1979   EP IMPLANTABLE DEVICE N/A 06/25/2016   Procedure: Lead Revision/Repair;  Surgeon: Will Meredith Leeds,  MD;  Location: [facility] CV LAB;  Service: Cardiovascular;  Laterality: N/A;   EP IMPLANTABLE DEVICE N/A 06/25/2016   Procedure: Pacemaker Implant;  Surgeon: Will Meredith Leeds, MD;  Location: [facility];  Service: Cardiovascular;  Laterality: N/A;   EXCISIONAL HEMORRHOIDECTOMY  02/20/1969   EYE SURGERY Left 06/22/1998   "branch vein occlusion"   EYE SURGERY Left ~ 2001   "smoothed out wrinkle"   LEFT OOPHORECTOMY  05/23/1979   nicked bowel, peritonitis, colostomy; colostomy reversed 1981    LOWER EXTREMITY ANGIOGRAM N/A 01/03/2014   Procedure: LOWER EXTREMITY ANGIOGRAM;  Surgeon: Wellington Hampshire, MD;  Location: [facility] CATH LAB;  Service: Cardiovascular;  Laterality: N/A;   Nuclear med stress test  10/21/2011   Small area of mild ischemia inferoapically.   PARTIAL HYSTERECTOMY  02/20/1969   left ovaries, then ovaries removed later due tumors    POLYPECTOMY  06/02/2021   Procedure: POLYPECTOMY;  Surgeon: Eloise Harman, DO;  [facility];  Service: Endoscopy;;   right eye surgery  03/2021   RIGHT OOPHORECTOMY  02/20/1969    Current Outpatient Medications  Medication Sig Dispense Refill  acetaminophen (TYLENOL) 325 MG tablet Take 650 mg by mouth every 6 (six) hours as needed for headache.     amiodarone (PACERONE) 200 MG tablet Take 0.5 tablets (100 mg total) by mouth daily. 45 tablet 1   apixaban (ELIQUIS) 5 MG TABS tablet Take 1 tablet (5 mg total) by mouth 2 (two) times daily. 28 tablet 0   atorvastatin (LIPITOR) 80 MG tablet TAKE ONE TABLET BY MOUTH EVERY DAY AT 6:00PM 90 tablet 3   carvedilol (COREG) 25 MG tablet Take 12.5 mg by mouth 2 (two) times daily.     Cholecalciferol (VITAMIN D3 PO) Take 2,000 Units by mouth every evening.     clopidogrel (PLAVIX) 75 MG tablet TAKE ONE TABLET (75MG  TOTAL) BY MOUTH DAILY 90 tablet 3   Continuous Blood Gluc Receiver (FREESTYLE LIBRE 2 READER) DEVI 1 each by Does not apply route daily. 1 each 0   Continuous Blood Gluc  Sensor (FREESTYLE LIBRE 2 SENSOR) MISC USE ONE FOR FOURTEEN DAYS 6 each 3   cyanocobalamin (,VITAMIN B-12,) 1000 MCG/ML injection Inject 1,000 mcg into the muscle every 30 (thirty) days. Last injection was around 08/02/19     diltiazem (TIAZAC) 360 MG 24 hr capsule Take 360 mg by mouth every morning.     furosemide (LASIX) 40 MG tablet TAKE ONE AND HALF TABLET (60MG  TOTAL) BY MOUTH IN THE MORNING AND ONE TABLET IN THE EVENING (40MG  TOTAL) 180 tablet 4   glucose blood (FREESTYLE PRECISION NEO TEST) test strip Use as instructed 100 each 3   hydrALAZINE (APRESOLINE) 50 MG tablet Take 1 tablet (50 mg total) by mouth 3 (three) times daily. (Patient taking differently: Take 50 mg by mouth 2 (two) times daily.) 270 tablet 3   insulin NPH-regular Human (NOVOLIN 70/30) (70-30) 100 UNIT/ML injection INJECT 35 UNITS BEFORE BREAKFAST AND 35 UNITS BEFORE SUPPER (Patient taking differently: 28-30 Units in the morning and at bedtime.) 45 mL 3   levothyroxine (SYNTHROID) 125 MCG tablet TAKE ONE TABLET BY MOUTH ONCE DAILY *New DOSE 90 tablet 3   nitroGLYCERIN (NITROSTAT) 0.4 MG SL tablet Place 1 tablet (0.4 mg total) under the tongue every 5 (five) minutes as needed for chest pain. 25 tablet 0   olmesartan (BENICAR) 40 MG tablet Take 40 mg by mouth in the morning.     potassium chloride SA (KLOR-CON) 20 MEQ tablet TAKE ONE (1) TABLET BY MOUTH EVERY DAY (Patient taking differently: 20 mEq every evening.) 90 tablet 1   rOPINIRole (REQUIP) 0.5 MG tablet Take 0.5 mg by mouth at bedtime.   0   Semaglutide, 2 MG/DOSE, 8 MG/3ML SOPN Inject 2 mg as directed once a week. 9 mL 3   NEOMYCIN-POLYMYXIN-HYDROCORTISONE (CORTISPORIN) 1 % SOLN OTIC solution Apply 1-2 drops to nail once daily after shower (Patient not taking: Reported on 07/28/2021) 10 mL 0   No current facility-administered medications for this visit.   Allergies:  Penicillins and Percocet [oxycodone-acetaminophen]   ROS: No frank syncope.  Physical Exam: VS:  BP  (!) 152/66    Pulse 75    Ht 5\' 3"  (1.6 m)    Wt 147 lb 3.2 oz (66.8 kg)    SpO2 98%    BMI 26.08 kg/m , BMI Body mass index is 26.08 kg/m.  Wt Readings from Last 3 Encounters:  07/28/21 147 lb 3.2 oz (66.8 kg)  06/06/21 152 lb 3.2 oz (69 kg)  05/29/21 150 lb (68 kg)    General: Patient appears comfortable at rest. HEENT:  Conjunctiva and lids normal, wearing a mask. Neck: Supple, no elevated JVP or carotid bruits, no thyromegaly. Lungs: Clear to auscultation, nonlabored breathing at rest. Cardiac: Regular rate and rhythm, no S3, 1/6 systolic murmur. Extremities: No pitting edema.  ECG:  An ECG dated 02/25/2021 was personally reviewed today and demonstrated:  Atrial paced rhythm.  Recent Labwork: 02/04/2021: TSH 0.40 02/25/2021: ALT 15; AST 6 05/29/2021: BUN 25; Creatinine, Ser 1.58; Hemoglobin 13.3; Platelets 265; Potassium 4.0; Sodium 140   Other Studies Reviewed Today:  Lexiscan Myoview 01/20/2018: Downsloping ST segment depression ST segment depression of 0.5 mm was noted during stress in the II, III, aVF, V5 and V6 leads. Defect 1: There is a small defect of mild severity present in the apical anterior and apical septal location. This likely represents soft tissue attenuation. A small degree of scar cannot entirely be excluded. No signficant ischemia. This is a low risk study. Nuclear stress EF: 64%.  Echocardiogram 08/26/2019:  1. Left ventricular ejection fraction, by estimation, is 55 to 60%. The  left ventricle has normal function. The left ventricle has no regional  wall motion abnormalities. There is moderate concentric left ventricular  hypertrophy. Left ventricular  diastolic function could not be evaluated. Left ventricular diastolic  function could not be evaluated. Elevated left ventricular end-diastolic  pressure.   2. Right ventricular systolic function is normal. The right ventricular  size is normal. There is mildly elevated pulmonary artery systolic  pressure.   3.  Left atrial size was severely dilated.   4. The mitral valve is normal in structure and function. Trivial mitral  valve regurgitation. No evidence of mitral stenosis.   5. The aortic valve is tricuspid. Aortic valve regurgitation is not  visualized. No aortic stenosis is present.   6. The inferior vena cava is normal in size with <50% respiratory  variability, suggesting right atrial pressure of 8 mmHg.   Assessment and Plan:  1.  CAD status post angioplasty of the OM1 in 2016, DES to the circumflex in 2016, and DES to the RCA in 2017.  She reports no active angina and has been comfortable with medical therapy and observation, last ischemic testing was in 2019.  NYHA class II dyspnea, no change in ADLs.  Continue Plavix, Lipitor, Benicar, Coreg, and as needed nitroglycerin.  2.  Paroxysmal atrial fibrillation with very low rhythm burden based on device interrogation.  She remains on low-dose amiodarone along with Coreg, Cardizem CD, and Eliquis.  No reported bleeding problems.  Hemoglobin normal in December 2022.  TSH and LFTs normal within the last 6 months.  3.  Tachycardia-bradycardia syndrome status post Medtronic pacemaker placement with follow-up by Dr. Curt Bears.  Device function normal by most recent interrogation.  4.  Essential hypertension, blood pressure elevated today but typically better at home.  No changes made today, unless she starts to notice recurring episodes of symptomatic hypotension requiring adjustment.  5.  CKD stage IIIb, continues to follow with Dr. Theador Hawthorne.  Recent hemoglobin 1.58.  6.  HFpEF, LVEF 55 to 60% as of 2021 with moderate LVH, normal RV contraction.  Weight is down 5 pounds and she shows no fluid retention today.  Continue Lasix with potassium supplement.  Medication Adjustments/Labs and Tests Ordered: Current medicines are reviewed at length with the patient today.  Concerns regarding medicines are outlined above.   Tests Ordered: No orders of the  defined types were placed in this encounter.   Medication Changes: No orders of the defined types were  placed in this encounter.   Disposition:  Follow up  6 months.  Signed, Satira Sark, MD, [facility] 07/28/2021 8:56 AM    [place] [facility] [address] [place], [place] [address] Phone: [phone]; Fax: [phone]

## 2021-07-28 ENCOUNTER — Ambulatory Visit (INDEPENDENT_AMBULATORY_CARE_PROVIDER_SITE_OTHER): Payer: HMO | Admitting: Cardiology

## 2021-07-28 ENCOUNTER — Other Ambulatory Visit: Payer: Self-pay

## 2021-07-28 ENCOUNTER — Encounter: Payer: Self-pay | Admitting: Cardiology

## 2021-07-28 VITALS — BP 152/66 | HR 75 | Ht 63.0 in | Wt 147.2 lb

## 2021-07-28 DIAGNOSIS — I25119 Atherosclerotic heart disease of native coronary artery with unspecified angina pectoris: Secondary | ICD-10-CM

## 2021-07-28 DIAGNOSIS — N1832 Chronic kidney disease, stage 3b: Secondary | ICD-10-CM

## 2021-07-28 DIAGNOSIS — I5032 Chronic diastolic (congestive) heart failure: Secondary | ICD-10-CM

## 2021-07-28 DIAGNOSIS — I480 Paroxysmal atrial fibrillation: Secondary | ICD-10-CM

## 2021-07-28 NOTE — Patient Instructions (Signed)
Medication Instructions:  Your physician recommends that you continue on your current medications as directed. Please refer to the Current Medication list given to you today.   Labwork: None today  Testing/Procedures: None today  Follow-Up: 6 months  Any Other Special Instructions Will Be Listed Below (If Applicable).  If you need a refill on your cardiac medications before your next appointment, please call your pharmacy.  

## 2021-08-07 ENCOUNTER — Other Ambulatory Visit: Payer: Self-pay

## 2021-08-07 ENCOUNTER — Ambulatory Visit (INDEPENDENT_AMBULATORY_CARE_PROVIDER_SITE_OTHER): Payer: HMO | Admitting: Sports Medicine

## 2021-08-07 ENCOUNTER — Encounter: Payer: Self-pay | Admitting: Sports Medicine

## 2021-08-07 DIAGNOSIS — Z7901 Long term (current) use of anticoagulants: Secondary | ICD-10-CM

## 2021-08-07 DIAGNOSIS — E119 Type 2 diabetes mellitus without complications: Secondary | ICD-10-CM

## 2021-08-07 DIAGNOSIS — M21619 Bunion of unspecified foot: Secondary | ICD-10-CM

## 2021-08-07 DIAGNOSIS — I5032 Chronic diastolic (congestive) heart failure: Secondary | ICD-10-CM | POA: Diagnosis not present

## 2021-08-07 DIAGNOSIS — E211 Secondary hyperparathyroidism, not elsewhere classified: Secondary | ICD-10-CM | POA: Diagnosis not present

## 2021-08-07 DIAGNOSIS — I129 Hypertensive chronic kidney disease with stage 1 through stage 4 chronic kidney disease, or unspecified chronic kidney disease: Secondary | ICD-10-CM | POA: Diagnosis not present

## 2021-08-07 DIAGNOSIS — E559 Vitamin D deficiency, unspecified: Secondary | ICD-10-CM | POA: Diagnosis not present

## 2021-08-07 DIAGNOSIS — M79674 Pain in right toe(s): Secondary | ICD-10-CM | POA: Diagnosis not present

## 2021-08-07 DIAGNOSIS — M79675 Pain in left toe(s): Secondary | ICD-10-CM

## 2021-08-07 DIAGNOSIS — B351 Tinea unguium: Secondary | ICD-10-CM

## 2021-08-07 DIAGNOSIS — E1122 Type 2 diabetes mellitus with diabetic chronic kidney disease: Secondary | ICD-10-CM | POA: Diagnosis not present

## 2021-08-07 DIAGNOSIS — E1129 Type 2 diabetes mellitus with other diabetic kidney complication: Secondary | ICD-10-CM | POA: Diagnosis not present

## 2021-08-07 DIAGNOSIS — M2040 Other hammer toe(s) (acquired), unspecified foot: Secondary | ICD-10-CM

## 2021-08-07 DIAGNOSIS — R809 Proteinuria, unspecified: Secondary | ICD-10-CM | POA: Diagnosis not present

## 2021-08-07 DIAGNOSIS — N189 Chronic kidney disease, unspecified: Secondary | ICD-10-CM | POA: Diagnosis not present

## 2021-08-07 DIAGNOSIS — L600 Ingrowing nail: Secondary | ICD-10-CM

## 2021-08-07 NOTE — Progress Notes (Signed)
Subjective: Virginia Huber is a 76 y.o. female patient with history of diabetes who presents to office today complaining of long,mildly painful nails while ambulating in shoes; unable to trim. Reports that she has some soreness at the left great toenail states that she has been using the drops and had to soak last night because of soreness.  Patient denies nausea, fever chills or any constitutional symptoms at this time or any significant redness warmth or drainage from the left great toe.  No other pedal complaints noted.  Blood sugar this morning 128 A1c 7.3 Sharilyn Sites, MD last visit over a year ago  Patient Active Problem List   Diagnosis Date Noted   History of colonic polyps 05/13/2021   Vitreous membranes and strands, right 01/21/2021   Advanced nonexudative age-related macular degeneration of left eye with subfoveal involvement 06/26/2020   Stable central retinal vein occlusion of left eye 05/13/2020   Early stage nonexudative age-related macular degeneration of right eye 02/27/2020   OSA on CPAP 02/12/2020   Branch retinal vein occlusion with macular edema of right eye 10/11/2019   Severe nonproliferative diabetic retinopathy of right eye, with macular edema, associated with type 2 diabetes mellitus ([place]) 10/11/2019   Right retinal defect 10/11/2019   Posterior vitreous detachment of right eye 10/11/2019   Retinal microaneurysm of right eye 10/11/2019   CHF exacerbation ([place]) 08/25/2019   Acute exacerbation of CHF (congestive heart failure) ([place]) 08/24/2019   Acute on chronic diastolic HF (heart failure) ([place]) 12/17/2017   GERD (gastroesophageal reflux disease) 12/17/2017   Typical atrial flutter (HCC)    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   Paroxysmal atrial fibrillation ([place]) 12/08/2017   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   Chronic renal failure, stage 3b ([place]) 09/23/2017   Fever 09/23/2017   S/P pericardiocentesis 06/28/2016    Acute blood loss anemia 06/28/2016   Pericardial effusion 06/26/2016   Tachy-brady syndrome ([place]) 06/25/2016   Tamponade    Bradycardia 06/14/2016   Junctional bradycardia    Coronary artery disease involving coronary bypass graft of native heart with unstable angina pectoris (HCC)    Acute diastolic CHF (congestive heart failure), NYHA class 3 (HCC)    Systolic congestive heart failure ([place]) 05/13/2016   Multiple and bilateral precerebral artery syndromes 05/13/2016   OSA (obstructive sleep apnea) 05/13/2016   Chronic diarrhea 12/25/2015   Chest pain 08/02/2015   Atrial fibrillation with rapid ventricular response (HCC)    Pain with urination 05/08/2015   NSTEMI (non-ST elevated myocardial infarction) ([place]) 04/02/2015   CAD in native artery 11/30/2014   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    PAD (peripheral artery disease) ([place]) 12/26/2013   Superficial fungus infection of skin 06/29/2013   UTI (urinary tract infection) 05/08/2013   Hypokalemia 03/05/2012   B12 deficiency anemia 03/02/2012   Bronchospasm 03/02/2012   Community acquired bacterial pneumonia 03/01/2012   Acute respiratory failure with hypoxia ([place]) 03/01/2012   Type 2 diabetes with nephropathy ([place]) 02/29/2012   Hypothyroidism 02/28/2012   RLQ abdominal pain 11/24/2010   OVERWEIGHT/OBESITY 06/03/2010   Essential hypertension 06/03/2010   Overweight 06/03/2010   Mixed hyperlipidemia 12/27/2009   Palpitations 05/17/2009   FRACTURE, TOE 12/06/2007   Current Outpatient Medications on File Prior to Visit  Medication Sig Dispense Refill   acetaminophen (TYLENOL) 325 MG tablet Take 650 mg by mouth every 6 (six) hours as needed for headache.     amiodarone (  PACERONE) 200 MG tablet Take 0.5 tablets (100 mg total) by mouth daily. 45 tablet 1   apixaban (ELIQUIS) 5 MG TABS tablet Take 1 tablet (5 mg total) by mouth 2 (two) times daily. 28 tablet 0   atorvastatin  (LIPITOR) 80 MG tablet TAKE ONE TABLET BY MOUTH EVERY DAY AT 6:00PM 90 tablet 3   carvedilol (COREG) 25 MG tablet Take 12.5 mg by mouth 2 (two) times daily.     Cholecalciferol (VITAMIN D3 PO) Take 2,000 Units by mouth every evening.     clopidogrel (PLAVIX) 75 MG tablet TAKE ONE TABLET (75MG  TOTAL) BY MOUTH DAILY 90 tablet 3   Continuous Blood Gluc Receiver (FREESTYLE LIBRE 2 READER) DEVI 1 each by Does not apply route daily. 1 each 0   Continuous Blood Gluc Sensor (FREESTYLE LIBRE 2 SENSOR) MISC USE ONE FOR FOURTEEN DAYS 6 each 3   cyanocobalamin (,VITAMIN B-12,) 1000 MCG/ML injection Inject 1,000 mcg into the muscle every 30 (thirty) days. Last injection was around 08/02/19     diltiazem (TIAZAC) 360 MG 24 hr capsule Take 360 mg by mouth every morning.     furosemide (LASIX) 40 MG tablet TAKE ONE AND HALF TABLET (60MG  TOTAL) BY MOUTH IN THE MORNING AND ONE TABLET IN THE EVENING (40MG  TOTAL) 180 tablet 4   glucose blood (FREESTYLE PRECISION NEO TEST) test strip Use as instructed 100 each 3   hydrALAZINE (APRESOLINE) 50 MG tablet Take 1 tablet (50 mg total) by mouth 3 (three) times daily. (Patient taking differently: Take 50 mg by mouth 2 (two) times daily.) 270 tablet 3   insulin NPH-regular Human (NOVOLIN 70/30) (70-30) 100 UNIT/ML injection INJECT 35 UNITS BEFORE BREAKFAST AND 35 UNITS BEFORE SUPPER (Patient taking differently: 28-30 Units in the morning and at bedtime.) 45 mL 3   levothyroxine (SYNTHROID) 125 MCG tablet TAKE ONE TABLET BY MOUTH ONCE DAILY *New DOSE 90 tablet 3   NEOMYCIN-POLYMYXIN-HYDROCORTISONE (CORTISPORIN) 1 % SOLN OTIC solution Apply 1-2 drops to nail once daily after shower (Patient not taking: Reported on 07/28/2021) 10 mL 0   nitroGLYCERIN (NITROSTAT) 0.4 MG SL tablet Place 1 tablet (0.4 mg total) under the tongue every 5 (five) minutes as needed for chest pain. 25 tablet 0   olmesartan (BENICAR) 40 MG tablet Take 40 mg by mouth in the morning.     potassium chloride SA  (KLOR-CON) 20 MEQ tablet TAKE ONE (1) TABLET BY MOUTH EVERY DAY (Patient taking differently: 20 mEq every evening.) 90 tablet 1   rOPINIRole (REQUIP) 0.5 MG tablet Take 0.5 mg by mouth at bedtime.   0   Semaglutide, 2 MG/DOSE, 8 MG/3ML SOPN Inject 2 mg as directed once a week. 9 mL 3   No current facility-administered medications on file prior to visit.   Allergies  Allergen Reactions   Penicillins Hives    Has patient had a PCN reaction causing immediate rash, facial/tongue/throat swelling, SOB or lightheadedness with hypotension: Yes Has patient had a PCN reaction causing severe rash involving mucus membranes or skin necrosis: No Has patient had a PCN reaction that required hospitalization No Has patient had a PCN reaction occurring within the last 10 years: No If all of the above answers are "NO", then may proceed with Cephalosporin use.   Percocet [Oxycodone-Acetaminophen] Nausea And Vomiting     Objective: General: Patient is awake, alert, and oriented x 3 and in no acute distress.  Integument: Skin is warm, dry and supple bilateral. Nails are tender, long, thickened and  dystrophic with subungual debris, consistent with onychomycosis, 1-5 bilateral. Mild curvature of bilateral hallux nails with most pain noted at the left hallux medial border.  No significant surrounding redness, swelling, no active drainage, no acute signs of infection. No open lesions or preulcerative lesions present bilateral. Remaining integument unremarkable.  Vasculature:  Dorsalis Pedis pulse 1/4 bilateral. Posterior Tibial pulse  0/4 bilateral. Capillary fill time <3 sec 1-5 bilateral. Positive hair growth to the level of the digits.Temperature gradient within normal limits. No varicosities present bilateral. Trace edema present bilateral.   Neurology: Gross sensation present via light touch bilateral.  Protective sensation intact with Semmes Weinstein monofilament bilateral.  Musculoskeletal: Asymptomatic  bunion and hammertoe with cross over with hammertoe with the right second toe most elevated.  Strength within normal limits.  No other acute findings or tenderness to palpation bilateral.  Assessment and Plan: Problem List Items Addressed This Visit   None Visit Diagnoses     Pain due to onychomycosis of toenails of both feet    -  Primary   Ingrown nail       Pain around toenail, left foot       Hallux medial border   Diabetes mellitus without complication (HCC)       Anticoagulated       Bunion       Hammer toe, unspecified laterality          -Examined patient. -Discussed with patient treatment options for pain at left great toenail; at this time we will plan to trim the toenail however at next visit recommend a partial nail avulsion of the left hallux medial border -Mechanically debrided all painful nails 1-5 bilateral using sterile nail nipper and filed with dremel without incident and removed offending border at left hallux medial nail fold without incident -Continue with Corticosporin solution as needed to bilateral hallux nails if there is any soreness and soaking especially the left foot tonight with Epson salt due to pain at the nail margin, left hallux medial border. -Answered all patient questions -Patient to return  in 2-1/2 months for at risk foot care nail trimming and nail avulsion procedure on the left -Patient advised to call the office if any problems or questions arise in the meantime.  Landis Martins, DPM

## 2021-08-14 DIAGNOSIS — E1129 Type 2 diabetes mellitus with other diabetic kidney complication: Secondary | ICD-10-CM | POA: Diagnosis not present

## 2021-08-14 DIAGNOSIS — E1122 Type 2 diabetes mellitus with diabetic chronic kidney disease: Secondary | ICD-10-CM | POA: Diagnosis not present

## 2021-08-14 DIAGNOSIS — I129 Hypertensive chronic kidney disease with stage 1 through stage 4 chronic kidney disease, or unspecified chronic kidney disease: Secondary | ICD-10-CM | POA: Diagnosis not present

## 2021-08-14 DIAGNOSIS — I5032 Chronic diastolic (congestive) heart failure: Secondary | ICD-10-CM | POA: Diagnosis not present

## 2021-08-14 DIAGNOSIS — N189 Chronic kidney disease, unspecified: Secondary | ICD-10-CM | POA: Diagnosis not present

## 2021-08-14 DIAGNOSIS — E211 Secondary hyperparathyroidism, not elsewhere classified: Secondary | ICD-10-CM | POA: Diagnosis not present

## 2021-08-14 DIAGNOSIS — E559 Vitamin D deficiency, unspecified: Secondary | ICD-10-CM | POA: Diagnosis not present

## 2021-08-14 DIAGNOSIS — R809 Proteinuria, unspecified: Secondary | ICD-10-CM | POA: Diagnosis not present

## 2021-08-18 ENCOUNTER — Other Ambulatory Visit: Payer: Self-pay

## 2021-08-18 ENCOUNTER — Ambulatory Visit (INDEPENDENT_AMBULATORY_CARE_PROVIDER_SITE_OTHER): Payer: HMO | Admitting: Ophthalmology

## 2021-08-18 ENCOUNTER — Encounter (INDEPENDENT_AMBULATORY_CARE_PROVIDER_SITE_OTHER): Payer: Self-pay | Admitting: Ophthalmology

## 2021-08-18 DIAGNOSIS — H34831 Tributary (branch) retinal vein occlusion, right eye, with macular edema: Secondary | ICD-10-CM | POA: Diagnosis not present

## 2021-08-18 DIAGNOSIS — E113411 Type 2 diabetes mellitus with severe nonproliferative diabetic retinopathy with macular edema, right eye: Secondary | ICD-10-CM | POA: Diagnosis not present

## 2021-08-18 DIAGNOSIS — H348122 Central retinal vein occlusion, left eye, stable: Secondary | ICD-10-CM

## 2021-08-18 MED ORDER — AFLIBERCEPT 2MG/0.05ML IZ SOLN FOR KALEIDOSCOPE
2.0000 mg | INTRAVITREAL | Status: AC | PRN
  Administered 2021-08-18: 2 mg via INTRAVITREAL

## 2021-08-18 NOTE — Assessment & Plan Note (Signed)
OD, center involved CSME some component of central retinal vein occlusion as well.  Now slightly responsive to most recent Avastin, had nonetheless this monocular patient not improving at a sufficient pace, will change to [facility] today

## 2021-08-18 NOTE — Progress Notes (Signed)
08/18/2021     CHIEF COMPLAINT Patient presents for  Chief Complaint  Patient presents with   Branch Retinal Vein Occlusion      HISTORY OF PRESENT ILLNESS: Virginia Huber is a 76 y.o. female who presents to the clinic today for:   HPI   1 mo dilate OD, Eylea OCT. (Already approved for GOOD DAYS). Patient states vision is stable and unchanged since last visit. Denies any new floaters or FOL.  Last edited by Laurin Coder on 08/18/2021  9:52 AM.      Referring physician: Sharilyn Sites, MD [address] [place],  [place] [address]  HISTORICAL INFORMATION:   Selected notes from the MEDICAL RECORD NUMBER    Lab Results  Component Value Date   HGBA1C 7.5 (A) 06/06/2021     CURRENT MEDICATIONS: No current outpatient medications on file. (Ophthalmic Drugs)   No current facility-administered medications for this visit. (Ophthalmic Drugs)   Current Outpatient Medications (Other)  Medication Sig   acetaminophen (TYLENOL) 325 MG tablet Take 650 mg by mouth every 6 (six) hours as needed for headache.   amiodarone (PACERONE) 200 MG tablet Take 0.5 tablets (100 mg total) by mouth daily.   apixaban (ELIQUIS) 5 MG TABS tablet Take 1 tablet (5 mg total) by mouth 2 (two) times daily.   atorvastatin (LIPITOR) 80 MG tablet TAKE ONE TABLET BY MOUTH EVERY DAY AT 6:00PM   carvedilol (COREG) 25 MG tablet Take 12.5 mg by mouth 2 (two) times daily.   Cholecalciferol (VITAMIN D3 PO) Take 2,000 Units by mouth every evening.   clopidogrel (PLAVIX) 75 MG tablet TAKE ONE TABLET (75MG  TOTAL) BY MOUTH DAILY   Continuous Blood Gluc Receiver (FREESTYLE LIBRE 2 READER) DEVI 1 each by Does not apply route daily.   Continuous Blood Gluc Sensor (FREESTYLE LIBRE 2 SENSOR) MISC USE ONE FOR FOURTEEN DAYS   cyanocobalamin (,VITAMIN B-12,) 1000 MCG/ML injection Inject 1,000 mcg into the muscle every 30 (thirty) days. Last injection was around 08/02/19   diltiazem (TIAZAC) 360 MG 24 hr capsule Take  360 mg by mouth every morning.   furosemide (LASIX) 40 MG tablet TAKE ONE AND HALF TABLET (60MG  TOTAL) BY MOUTH IN THE MORNING AND ONE TABLET IN THE EVENING (40MG  TOTAL)   glucose blood (FREESTYLE PRECISION NEO TEST) test strip Use as instructed   hydrALAZINE (APRESOLINE) 50 MG tablet Take 1 tablet (50 mg total) by mouth 3 (three) times daily. (Patient taking differently: Take 50 mg by mouth 2 (two) times daily.)   insulin NPH-regular Human (NOVOLIN 70/30) (70-30) 100 UNIT/ML injection INJECT 35 UNITS BEFORE BREAKFAST AND 35 UNITS BEFORE SUPPER (Patient taking differently: 28-30 Units in the morning and at bedtime.)   levothyroxine (SYNTHROID) 125 MCG tablet TAKE ONE TABLET BY MOUTH ONCE DAILY *New DOSE   NEOMYCIN-POLYMYXIN-HYDROCORTISONE (CORTISPORIN) 1 % SOLN OTIC solution Apply 1-2 drops to nail once daily after shower (Patient not taking: Reported on 07/28/2021)   nitroGLYCERIN (NITROSTAT) 0.4 MG SL tablet Place 1 tablet (0.4 mg total) under the tongue every 5 (five) minutes as needed for chest pain.   olmesartan (BENICAR) 40 MG tablet Take 40 mg by mouth in the morning.   potassium chloride SA (KLOR-CON) 20 MEQ tablet TAKE ONE (1) TABLET BY MOUTH EVERY DAY (Patient taking differently: 20 mEq every evening.)   rOPINIRole (REQUIP) 0.5 MG tablet Take 0.5 mg by mouth at bedtime.    Semaglutide, 2 MG/DOSE, 8 MG/3ML SOPN Inject 2 mg as directed once a  week.   No current facility-administered medications for this visit. (Other)      REVIEW OF SYSTEMS:    ALLERGIES Allergies  Allergen Reactions   Penicillins Hives    Has patient had a PCN reaction causing immediate rash, facial/tongue/throat swelling, SOB or lightheadedness with hypotension: Yes Has patient had a PCN reaction causing severe rash involving mucus membranes or skin necrosis: No Has patient had a PCN reaction that required hospitalization No Has patient had a PCN reaction occurring within the last 10 years: No If all of the  above answers are "NO", then may proceed with Cephalosporin use.   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 h/o PAF/flutter during admission in 2013 for PNA. b. PAF during adm for NSTEMI 07/2015, subsequent paroxysms since then.   B12 deficiency anemia    Branch retinal vein occlusion with macular edema of right eye 10/11/2019   Chronic renal failure, stage 3b ([place]) 09/23/2017   Coronary artery disease 11/30/2014   a. remote MI. b. h/o PTCA with scoring balloon to OM1 11/2014. c. NSTEMI 03/2015 s/p DES to prox-mid Cx. d. NSTEMI 07/2015 s/p scoring balloon/PTCA/DES to dRCA with PAF during that admission   Cutaneous lupus erythematosus    Early stage nonexudative age-related macular degeneration of right eye 02/27/2020   Essential hypertension    GERD (gastroesophageal reflux disease)    History of blood transfusion 1980's   2nd surgical procedures   Hypercholesteremia    Hypothyroidism    Myocardial infarction ([place]) 02/2012   NSTEMI (non-ST elevated myocardial infarction) ([place]) 04/02/2015   OSA (obstructive sleep apnea) 05/13/2016   Ovarian tumor    PAD (peripheral artery disease) ([place])    a. s/p LE angio 2015; followed by Dr. Fletcher Anon - managed medically.   Pericardial effusion    a. 06/2016 after ppm - s/p pericardiocentesis.   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 s/p Medtronic PPM 06/2016, c/b lead perf/pericardial effusion.   TIA (transient ischemic attack)    Type 2 diabetes with nephropathy ([place]) 02/29/2012   Past Surgical History:  Procedure Laterality Date   ABDOMINAL AORTAGRAM N/A 01/03/2014   Procedure: ABDOMINAL [name];  Surgeon: Wellington Hampshire, MD;  Location: [place] CATH LAB;  Service: Cardiovascular;  Laterality: N/A;   ABDOMINAL HYSTERECTOMY  06/22/1970   "partial"   APPENDECTOMY  02/20/1969   CARDIAC CATHETERIZATION  06/22/2006   Tiny OM-2 with 90% narrowing. Med tx.   CARDIAC CATHETERIZATION N/A 11/30/2014   Procedure: Left Heart Cath and Coronary Angiography;  Surgeon: Troy Sine, MD; LAD 20%, CFX 50%, OM1 95%, right PLB 30%, LV normal    CARDIAC CATHETERIZATION N/A 11/30/2014   Procedure: Coronary Balloon Angioplasty;  Surgeon: Troy Sine, MD;  Angiosculpt scoring balloon and PTCA to the OM1 reducing stenosis from 95% to less than 10%   CARDIAC CATHETERIZATION N/A 04/03/2015   Procedure: Left  Heart Cath and Coronary Angiography;  Surgeon: Jolaine Artist, MD; dLAD 50%, CFX 90%, OM1 100%, PLA 15%, LVEDP 13     CARDIAC CATHETERIZATION N/A 04/03/2015   Procedure: Coronary Stent Intervention;  Surgeon: Sherren Mocha, MD; 3.0x18 mm Xience DES to the CFX     CARDIAC CATHETERIZATION N/A 08/02/2015   Procedure: Left Heart Cath and Coronary Angiography;  Surgeon: Troy Sine, MD;  Location: [place] CV LAB;  Service: Cardiovascular;  Laterality: N/A;   CARDIAC CATHETERIZATION N/A 08/02/2015   Procedure: Coronary Stent Intervention;  Surgeon: Troy Sine, MD;  Location: [place] CV LAB;  Service: Cardiovascular;  Laterality: N/A;   CARDIAC CATHETERIZATION N/A 06/25/2016   Procedure: Pericardiocentesis;  Surgeon: Will Meredith Leeds, MD;  Location: [place] CV LAB;  Service: Cardiovascular;  Laterality: N/A;   cardiac stents     CARDIOVERSION N/A 12/15/2017   Procedure:  CARDIOVERSION;  Surgeon: Acie Fredrickson Wonda Cheng, MD;  Location: [facility] ENDOSCOPY;  Service: Cardiovascular;  Laterality: N/A;   CHOLECYSTECTOMY OPEN  02/20/1989   COLONOSCOPY  06/23/2003   Dr. Laural Golden: pancolonic divericula, polyp, path unknown currently   COLONOSCOPY  06/22/2010   Dr. Oneida Alar: Normal TI, scattered diverticula in entire colon, small internal hemorrhoids, normal colon biopsies. Colonoscopy in 5-10 years.    COLONOSCOPY WITH PROPOFOL N/A 06/02/2021   Procedure: COLONOSCOPY WITH PROPOFOL;  Surgeon: Eloise Harman, DO;  [facility];  Service: Endoscopy;  Laterality: N/A;  8:30am   COLOSTOMY  05/23/1979   COLOSTOMY REVERSAL  11/21/1979   EP IMPLANTABLE DEVICE N/A 06/25/2016   Procedure: Lead Revision/Repair;  Surgeon: Will Meredith Leeds, MD;  Location: [place] CV LAB;  Service: Cardiovascular;  Laterality: N/A;   EP IMPLANTABLE DEVICE N/A 06/25/2016   Procedure: Pacemaker Implant;  Surgeon: Will Meredith Leeds, MD;  Location: [facility] CV LAB;  Service: Cardiovascular;  Laterality: N/A;   EXCISIONAL HEMORRHOIDECTOMY  02/20/1969   EYE SURGERY Left 06/22/1998   "branch vein occlusion"   EYE SURGERY Left ~ 2001   "smoothed out wrinkle"   LEFT OOPHORECTOMY  05/23/1979   nicked bowel, peritonitis, colostomy; colostomy reversed 1981    LOWER EXTREMITY ANGIOGRAM N/A 01/03/2014   Procedure: LOWER EXTREMITY ANGIOGRAM;  Surgeon: Wellington Hampshire, MD;  Location: [place] CATH LAB;  Service: Cardiovascular;  Laterality: N/A;   Nuclear med stress test  10/21/2011   Small area of mild ischemia inferoapically.   PARTIAL HYSTERECTOMY  02/20/1969   left ovaries, then ovaries removed later due tumors    POLYPECTOMY  06/02/2021   Procedure: POLYPECTOMY;  Surgeon: Eloise Harman, DO;  [facility];  Service: Endoscopy;;   right eye surgery  03/2021   RIGHT OOPHORECTOMY  02/20/1969    FAMILY HISTORY Family History  Problem Relation Age of Onset   Heart disease Mother         deceased   Heart disease Father        deceased, heart disease   Diabetes Brother    Heart disease Brother    Thyroid disease Brother    Heart disease Sister    Heart disease Brother    Thyroid disease Brother    Lupus Daughter    Colon cancer Neg Hx     SOCIAL HISTORY Social History   Tobacco Use   Smoking status: Never   Smokeless tobacco: Never   Tobacco comments:    Never smoked  Vaping Use   Vaping Use: Never used  Substance Use Topics  Alcohol use: No    Alcohol/week: 0.0 standard drinks   Drug use: No         OPHTHALMIC EXAM:  Base Eye Exam     Visual Acuity (ETDRS)       Right Left   Dist cc 20/60 +2 CF at 3'   Dist ph cc NI NI    Correction: Glasses         Tonometry (Tonopen, 9:56 AM)       Right Left   Pressure 12 13         Pupils       Dark Light Shape APD   Right 5 4 Round None   Left 5 4 Round None         Neuro/Psych     Oriented x3: Yes   Mood/Affect: Normal         Dilation     Right eye: 1.0% Mydriacyl, 2.5% Phenylephrine @ 9:56 AM           Slit Lamp and Fundus Exam     External Exam       Right Left   External Normal Normal         Slit Lamp Exam       Right Left   Lids/Lashes Normal Normal   Conjunctiva/Sclera White and quiet White and quiet   Cornea Clear Clear   Anterior Chamber Deep and quiet Deep and quiet   Iris Round and reactive Round and reactive   Lens Posterior chamber intraocular lens Posterior chamber intraocular lens   Anterior Vitreous Normal, no [place] Normal         Fundus Exam       Right Left   Posterior Vitreous Clear and avitric, no debris visible    Disc Normal    C/D Ratio 0.2    Macula Hard drusen, , Microaneurysms, Retinal pigment epithelial mottling, no hemorrhage, no exudates, Cystoid macular edema    Vessels Old branch retinal vein occlusion    Periphery Normal,  , no retinal hole, no retinal tear, good peripheral PRP sector              IMAGING AND PROCEDURES  Imaging and Procedures for 08/18/21  Intravitreal Injection, Pharmacologic Agent - OD - Right Eye       Time Out 08/18/2021. 11:09 AM. Confirmed correct patient, procedure, site, and patient consented.   Anesthesia Topical anesthesia was used. Anesthetic medications included Lidocaine 4%.   Procedure Preparation included 10% betadine to eyelids, 5% betadine to ocular surface, Tobramycin 0.3%. A 30 gauge needle was used.   Injection: 2 mg aflibercept 2 MG/0.05ML   Route: Intravitreal, Site: Right Eye   NDC: [id number], Lot: [id number], Waste: 0 mL   Post-op Post injection exam found visual acuity of at least counting fingers. The patient tolerated the procedure well. There were no complications. The patient received written and verbal post procedure care education. Post injection medications included ocuflox.      OCT, Retina - OU - Both Eyes       Right Eye Quality was good. Scan locations included subfoveal. Central Foveal Thickness: 435. Progression has been stable. Findings include cystoid macular edema.   Left Eye Quality was good. Scan locations included subfoveal. Central Foveal Thickness: 222. Progression has been stable. Findings include subretinal scarring, disciform scar.   Notes Recurrent CME from [facility], with underlying diabetic retinopathy post vitrectomy for floaters, as well at 5-week interval.  Decreased slightly CME from macular [facility] at  5-week interval on Avastin.  Still plan change to Eylea  OS, no active maculopathy, cicatricial scarring Accounts for acuity             ASSESSMENT/PLAN:  Severe nonproliferative diabetic retinopathy of right eye, with macular edema, associated with type 2 diabetes mellitus ([place]) OD, center involved CSME some component of central retinal vein occlusion as well.  Now slightly responsive to most recent Avastin, had nonetheless this monocular patient not improving at a sufficient pace, will  change to [facility] today     ICD-10-CM   1. Branch retinal vein occlusion with macular edema of right eye  H34.8310 Intravitreal Injection, Pharmacologic Agent - OD - Right Eye    OCT, Retina - OU - Both Eyes    aflibercept (EYLEA) SOLN 2 mg    CANCELED: OCT, Retina - OU - Both Eyes    2. Stable central retinal vein occlusion of left eye  H34.8122 OCT, Retina - OU - Both Eyes    3. Severe nonproliferative diabetic retinopathy of right eye, with macular edema, associated with type 2 diabetes mellitus ([place])  E11.3411       1.  Center involved CSME, CME, now resistant to Avastin use.  Changed to intravitreal Eylea OD today  2.  3.  Ophthalmic Meds Ordered this visit:  Meds ordered this encounter  Medications   aflibercept (EYLEA) SOLN 2 mg       Return in about 5 weeks (around 09/22/2021) for dilate, OD, EYLEA OCT.  There are no Patient Instructions on file for this visit.   Explained the diagnoses, plan, and follow up with the patient and they expressed understanding.  Patient expressed understanding of the importance of proper follow up care.   Clent Demark Stacey Maura M.D. Diseases & Surgery of the Retina and Vitreous Retina & Diabetic [place] 08/18/21     Abbreviations: M myopia (nearsighted); A astigmatism; H hyperopia (farsighted); P presbyopia; Mrx spectacle prescription;  CTL contact lenses; OD right eye; OS left eye; OU both eyes  XT exotropia; ET esotropia; PEK punctate epithelial keratitis; PEE punctate epithelial erosions; DES dry eye syndrome; MGD meibomian gland dysfunction; ATs artificial tears; PFAT's preservative free artificial tears; [place] nuclear sclerotic cataract; PSC posterior subcapsular cataract; ERM epi-retinal membrane; PVD posterior vitreous detachment; RD retinal detachment; DM diabetes mellitus; DR diabetic retinopathy; NPDR non-proliferative diabetic retinopathy; PDR proliferative diabetic retinopathy; CSME clinically significant macular edema; DME diabetic  macular edema; dbh dot blot hemorrhages; CWS cotton wool spot; POAG primary open angle glaucoma; C/D cup-to-disc ratio; HVF humphrey visual field; GVF goldmann visual field; OCT optical coherence tomography; IOP intraocular pressure; BRVO Branch retinal vein occlusion; CRVO central retinal vein occlusion; CRAO central retinal artery occlusion; BRAO branch retinal artery occlusion; RT retinal tear; SB scleral buckle; PPV pars plana vitrectomy; VH Vitreous hemorrhage; PRP panretinal laser photocoagulation; IVK intravitreal kenalog; VMT vitreomacular traction; MH Macular hole;  NVD neovascularization of the disc; NVE neovascularization elsewhere; AREDS age related eye disease study; ARMD age related macular degeneration; POAG primary open angle glaucoma; EBMD epithelial/anterior basement membrane dystrophy; ACIOL anterior chamber intraocular lens; IOL intraocular lens; PCIOL posterior chamber intraocular lens; Phaco/IOL phacoemulsification with intraocular lens placement; [place] photorefractive keratectomy; LASIK laser assisted in situ keratomileusis; HTN hypertension; DM diabetes mellitus; COPD chronic obstructive pulmonary disease

## 2021-08-18 NOTE — Assessment & Plan Note (Signed)
Center involved CME, improved today 0.5 weeks post most recent Avastin, changed to [facility] for more rapid improvement

## 2021-08-25 ENCOUNTER — Encounter: Payer: Self-pay | Admitting: Cardiology

## 2021-08-25 ENCOUNTER — Other Ambulatory Visit: Payer: Self-pay

## 2021-08-25 ENCOUNTER — Ambulatory Visit (INDEPENDENT_AMBULATORY_CARE_PROVIDER_SITE_OTHER): Payer: HMO | Admitting: Cardiology

## 2021-08-25 VITALS — BP 134/70 | HR 86 | Ht 63.0 in | Wt 145.6 lb

## 2021-08-25 DIAGNOSIS — I495 Sick sinus syndrome: Secondary | ICD-10-CM | POA: Diagnosis not present

## 2021-08-25 DIAGNOSIS — I480 Paroxysmal atrial fibrillation: Secondary | ICD-10-CM

## 2021-08-25 NOTE — Patient Instructions (Addendum)
Medication Instructions:  ?No changes ?*If you need a refill on your cardiac medications before your next appointment, please call your pharmacy* ? ? ?Lab Work: ?TODAY - TSH, LFTs ?If you have labs (blood work) drawn today and your tests are completely normal, you will receive your results only by: ?MyChart Message (if you have MyChart) OR ?A paper copy in the mail ?If you have any lab test that is abnormal or we need to change your treatment, we will call you to review the results. ? ? ? ?Follow-Up: ?At [facility], you and your health needs are our priority.  As part of our continuing mission to provide you with exceptional heart care, we have created designated Provider Care Teams.  These Care Teams include your primary Cardiologist (physician) and Advanced Practice Providers (APPs -  Physician Assistants and Nurse Practitioners) who all work together to provide you with the care you need, when you need it. ? ?We recommend signing up for the patient portal called "MyChart".  Sign up information is provided on this After Visit Summary.  MyChart is used to connect with patients for Virtual Visits (Telemedicine).  Patients are able to view lab/test results, encounter notes, upcoming appointments, etc.  Non-urgent messages can be sent to your provider as well.   ?To learn more about what you can do with MyChart, go to [email or web address].   ? ?Your next appointment:   ?6 month(s) ? ?The format for your next appointment:   ?In Person ? ?Provider:   ?EP APP ?Tommye Standard, PA or Oda Kilts, [place] ? ? ?

## 2021-08-25 NOTE — Progress Notes (Signed)
Electrophysiology Office Note   Date:  08/25/2021   ID:  Virginia Huber, Virginia Huber 05/06/1946, MRN [id number]  PCP:  Sharilyn Sites, MD  Cardiologist:  Bronson Ing Primary Electrophysiologist:  Omarie Parcell Meredith Leeds, MD    No chief complaint on file.     History of Present Illness: Virginia Huber is a 76 y.o. female who presents today for electrophysiology evaluation.     She has a history seen for coronary artery disease status post PCI to the OM1 with STEMI in 2016.  PCI to the circumflex non-STEMI in 2017 with PCI to the RCA.  She has paroxysmal atrial fibrillation during that admission.  She is currently on Eliquis.  Sleep study showed mild sleep apnea.  She was hospitalized December 2017 with atrial fibrillation rapid rates.  She was cardioverted into junctional rhythm.  She is status post Medtronic dual-chamber pacemaker.  Today, denies symptoms of palpitations, chest pain, shortness of breath, orthopnea, PND, lower extremity edema, claudication, presyncope, syncope, bleeding, or neurologic sequela. The patient is tolerating medications without difficulties.  Today she is feeling well.  She has no chest pain or shortness of breath.  She does get intermittently dizzy.  Her dizziness happens when she has been standing for quite some time.  This does not occur when she goes from a sitting to a standing position.  Device interrogation shows no arrhythmias that would correlate to her episodes of dizziness.  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 h/o PAF/flutter during admission in 2013 for PNA. b. PAF during adm for NSTEMI 07/2015, subsequent paroxysms since then.   B12 deficiency anemia     Branch retinal vein occlusion with macular edema of right eye 10/11/2019   Chronic renal failure, stage 3b ([place]) 09/23/2017   Coronary artery disease 11/30/2014   a. remote MI. b. h/o PTCA with scoring balloon to OM1 11/2014. c. NSTEMI 03/2015 s/p DES to prox-mid Cx. d. NSTEMI 07/2015 s/p scoring balloon/PTCA/DES to dRCA with PAF during that admission   Cutaneous lupus erythematosus    Early stage nonexudative age-related macular degeneration of right eye 02/27/2020   Essential hypertension    GERD (gastroesophageal reflux disease)    History of blood transfusion 1980's   2nd surgical procedures   Hypercholesteremia    Hypothyroidism    Myocardial infarction ([place]) 02/2012   NSTEMI (non-ST elevated myocardial infarction) ([place]) 04/02/2015   OSA (obstructive sleep apnea) 05/13/2016   Ovarian tumor    PAD (peripheral artery disease) ([place])    a. s/p LE angio 2015; followed by Dr. Fletcher Anon - managed medically.   Pericardial effusion    a. 06/2016 after ppm - s/p pericardiocentesis.   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 s/p Medtronic PPM 06/2016, c/b lead perf/pericardial effusion.   TIA (transient ischemic attack)    Type 2 diabetes with nephropathy ([place]) 02/29/2012   Past Surgical History:  Procedure Laterality Date   ABDOMINAL AORTAGRAM N/A 01/03/2014  Procedure: ABDOMINAL AORTAGRAM;  Surgeon: Wellington Hampshire, MD;  Location: [facility] CATH LAB;  Service: Cardiovascular;  Laterality: N/A;   ABDOMINAL HYSTERECTOMY  06/22/1970   "partial"   APPENDECTOMY  02/20/1969   CARDIAC CATHETERIZATION  06/22/2006   Tiny OM-2 with 90% narrowing. Med tx.   CARDIAC CATHETERIZATION N/A 11/30/2014   Procedure: Left Heart Cath and Coronary  Angiography;  Surgeon: Troy Sine, MD; LAD 20%, CFX 50%, OM1 95%, right PLB 30%, LV normal    CARDIAC CATHETERIZATION N/A 11/30/2014   Procedure: Coronary Balloon Angioplasty;  Surgeon: Troy Sine, MD;  Angiosculpt scoring balloon and PTCA to the OM1 reducing stenosis from 95% to less than 10%   CARDIAC CATHETERIZATION N/A 04/03/2015   Procedure: Left Heart Cath and Coronary Angiography;  Surgeon: Jolaine Artist, MD; dLAD 50%, CFX 90%, OM1 100%, PLA 15%, LVEDP 13     CARDIAC CATHETERIZATION N/A 04/03/2015   Procedure: Coronary Stent Intervention;  Surgeon: Sherren Mocha, MD; 3.0x18 mm Xience DES to the CFX     CARDIAC CATHETERIZATION N/A 08/02/2015   Procedure: Left Heart Cath and Coronary Angiography;  Surgeon: Troy Sine, MD;  Location: [place] CV LAB;  Service: Cardiovascular;  Laterality: N/A;   CARDIAC CATHETERIZATION N/A 08/02/2015   Procedure: Coronary Stent Intervention;  Surgeon: Troy Sine, MD;  Location: [place] CV LAB;  Service: Cardiovascular;  Laterality: N/A;   CARDIAC CATHETERIZATION N/A 06/25/2016   Procedure: Pericardiocentesis;  Surgeon: Kacelyn Rowzee Meredith Leeds, MD;  Location: [place] CV LAB;  Service: Cardiovascular;  Laterality: N/A;   cardiac stents     CARDIOVERSION N/A 12/15/2017   Procedure: CARDIOVERSION;  Surgeon: Acie Fredrickson Wonda Cheng, MD;  Location: [facility] ENDOSCOPY;  Service: Cardiovascular;  Laterality: N/A;   CHOLECYSTECTOMY OPEN  02/20/1989   COLONOSCOPY  06/23/2003   Dr. Laural Golden: pancolonic divericula, polyp, path unknown currently   COLONOSCOPY  06/22/2010   Dr. Oneida Alar: Normal TI, scattered diverticula in entire colon, small internal hemorrhoids, normal colon biopsies. Colonoscopy in 5-10 years.    COLONOSCOPY WITH PROPOFOL N/A 06/02/2021   Procedure: COLONOSCOPY WITH PROPOFOL;  Surgeon: Eloise Harman, DO;  [facility];  Service: Endoscopy;  Laterality: N/A;  8:30am   COLOSTOMY  05/23/1979   COLOSTOMY REVERSAL  11/21/1979    EP IMPLANTABLE DEVICE N/A 06/25/2016   Procedure: Lead Revision/Repair;  Surgeon: Marlean Mortell Meredith Leeds, MD;  Location: [place] CV LAB;  Service: Cardiovascular;  Laterality: N/A;   EP IMPLANTABLE DEVICE N/A 06/25/2016   Procedure: Pacemaker Implant;  Surgeon: Raynor Calcaterra Meredith Leeds, MD;  Location: [place] CV LAB;  Service: Cardiovascular;  Laterality: N/A;   EXCISIONAL HEMORRHOIDECTOMY  02/20/1969   EYE SURGERY Left 06/22/1998   "branch vein occlusion"   EYE SURGERY Left ~ 2001   "smoothed out wrinkle"   LEFT OOPHORECTOMY  05/23/1979   nicked bowel, peritonitis, colostomy; colostomy reversed 1981    LOWER EXTREMITY ANGIOGRAM N/A 01/03/2014   Procedure: LOWER EXTREMITY ANGIOGRAM;  Surgeon: Wellington Hampshire, MD;  Location: [place] CATH LAB;  Service: Cardiovascular;  Laterality: N/A;   Nuclear med stress test  10/21/2011   Small area of mild ischemia inferoapically.   PARTIAL HYSTERECTOMY  02/20/1969   left ovaries, then ovaries removed later due tumors    POLYPECTOMY  06/02/2021   Procedure: POLYPECTOMY;  Surgeon: Eloise Harman, DO;  [facility];  Service: Endoscopy;;   right eye surgery  03/2021   RIGHT OOPHORECTOMY  02/20/1969     Current  Outpatient Medications  Medication Sig Dispense Refill   acetaminophen (TYLENOL) 325 MG tablet Take 650 mg by mouth every 6 (six) hours as needed for headache.     amiodarone (PACERONE) 200 MG tablet Take 0.5 tablets (100 mg total) by mouth daily. 45 tablet 1   apixaban (ELIQUIS) 5 MG TABS tablet Take 1 tablet (5 mg total) by mouth 2 (two) times daily. 28 tablet 0   atorvastatin (LIPITOR) 80 MG tablet TAKE ONE TABLET BY MOUTH EVERY DAY AT 6:00PM 90 tablet 3   carvedilol (COREG) 25 MG tablet Take 12.5 mg by mouth 2 (two) times daily.     Cholecalciferol (VITAMIN D3 PO) Take 2,000 Units by mouth every evening.     clopidogrel (PLAVIX) 75 MG tablet TAKE ONE TABLET ('75MG'$  TOTAL) BY MOUTH DAILY 90 tablet 3   Continuous Blood Gluc  Receiver (FREESTYLE LIBRE 2 READER) DEVI 1 each by Does not apply route daily. 1 each 0   Continuous Blood Gluc Sensor (FREESTYLE LIBRE 2 SENSOR) MISC USE ONE FOR FOURTEEN DAYS 6 each 3   cyanocobalamin (,VITAMIN B-12,) 1000 MCG/ML injection Inject 1,000 mcg into the muscle every 30 (thirty) days. Last injection was around 08/02/19     diltiazem (TIAZAC) 360 MG 24 hr capsule Take 360 mg by mouth every morning.     furosemide (LASIX) 40 MG tablet TAKE ONE AND HALF TABLET ('60MG'$  TOTAL) BY MOUTH IN THE MORNING AND ONE TABLET IN THE EVENING ('40MG'$  TOTAL) 180 tablet 4   glucose blood (FREESTYLE PRECISION NEO TEST) test strip Use as instructed 100 each 3   hydrALAZINE (APRESOLINE) 50 MG tablet Take 50 mg by mouth in the morning and at bedtime.     insulin NPH-regular Human (NOVOLIN 70/30) (70-30) 100 UNIT/ML injection INJECT 35 UNITS BEFORE BREAKFAST AND 35 UNITS BEFORE SUPPER (Patient taking differently: INJECT 25 UNITS BEFORE BREAKFAST AND 35 UNITS BEFORE SUPPER) 45 mL 3   levothyroxine (SYNTHROID) 125 MCG tablet TAKE ONE TABLET BY MOUTH ONCE DAILY *New DOSE 90 tablet 3   nitroGLYCERIN (NITROSTAT) 0.4 MG SL tablet Place 1 tablet (0.4 mg total) under the tongue every 5 (five) minutes as needed for chest pain. 25 tablet 0   olmesartan (BENICAR) 40 MG tablet Take 40 mg by mouth in the morning.     potassium chloride SA (KLOR-CON) 20 MEQ tablet TAKE ONE (1) TABLET BY MOUTH EVERY DAY (Patient taking differently: 20 mEq every evening.) 90 tablet 1   rOPINIRole (REQUIP) 0.5 MG tablet Take 0.5 mg by mouth at bedtime.   0   Semaglutide, 2 MG/DOSE, 8 MG/3ML SOPN Inject 2 mg as directed once a week. 9 mL 3   NEOMYCIN-POLYMYXIN-HYDROCORTISONE (CORTISPORIN) 1 % SOLN OTIC solution Apply 1-2 drops to nail once daily after shower (Patient not taking: Reported on 07/28/2021) 10 mL 0   No current facility-administered medications for this visit.    Allergies:   Penicillins and Percocet [oxycodone-acetaminophen]   Social  History:  The patient  reports that she has never smoked. She has never been exposed to tobacco smoke. She has never used smokeless tobacco. She reports that she does not drink alcohol and does not use drugs.   Family History:  The patient's family history includes Diabetes in her brother; Heart disease in her brother, brother, father, mother, and sister; Lupus in her daughter; Thyroid disease in her brother and brother.   ROS:  Please see the history of present illness.   Otherwise, review of systems is positive for  none.   All other systems are reviewed and negative.   PHYSICAL EXAM: VS:  BP 134/70    Pulse 86    Ht '5\' 3"'$  (1.6 m)    Wt 145 lb 9.6 oz (66 kg)    SpO2 99%    BMI 25.79 kg/m  , BMI Body mass index is 25.79 kg/m. GEN: Well nourished, well developed, in no acute distress  HEENT: normal  Neck: no JVD, carotid bruits, or masses Cardiac: RRR; no murmurs, rubs, or gallops,no edema  Respiratory:  clear to auscultation bilaterally, normal work of breathing GI: soft, nontender, nondistended, + BS MS: no deformity or atrophy  Skin: warm and dry, device site well healed Neuro:  Strength and sensation are intact Psych: euthymic mood, full affect  EKG:  EKG is ordered today. Personal review of the ekg ordered shows atrial paced, first-degree AV block, rate 86  Personal review of the device interrogation today. Results in [place]: 02/04/2021: TSH 0.40 02/25/2021: ALT 15 05/29/2021: BUN 25; Creatinine, Ser 1.58; Hemoglobin 13.3; Platelets 265; Potassium 4.0; Sodium 140    Lipid Panel     Component Value Date/Time   CHOL 166 08/05/2017 0807   TRIG 160 (H) 08/05/2017 0807   HDL 43 (L) 08/05/2017 0807   CHOLHDL 3.9 08/05/2017 0807   VLDL NOT CALC 04/14/2016 0910   LDLCALC 96 08/05/2017 0807     Wt Readings from Last 3 Encounters:  08/25/21 145 lb 9.6 oz (66 kg)  07/28/21 147 lb 3.2 oz (66.8 kg)  06/06/21 152 lb 3.2 oz (69 kg)      Other studies  Reviewed: Additional studies/ records that were reviewed today include: TTE 08/04/17 Review of the above records today demonstrates:  - Left ventricle: The cavity size was normal. Wall thickness was   increased in a pattern of moderate LVH. Systolic function was   normal. The estimated ejection fraction was in the range of 55%   to 60%. Wall motion was normal; there were no regional wall   motion abnormalities. Features are consistent with a pseudonormal   left ventricular filling pattern, with concomitant abnormal   relaxation and increased filling pressure (grade 2 diastolic   dysfunction). Doppler parameters are consistent with high   ventricular filling pressure. - Left atrium: The atrium was severely dilated. - Right ventricle: Pacer wire or catheter noted in right ventricle. - Right atrium: Pacer wire or catheter noted in right atrium.  Cath  08/02/15 1st RPLB lesion, 50% stenosed. Dist RCA lesion, 30% stenosed. Ost 1st Mrg to 1st Mrg lesion, 100% stenosed. Ost LAD lesion, 40% stenosed. Mid LAD lesion, 40% stenosed. Post Atrio lesion, 90% stenosed. Post intervention, there is a 0% residual stenosis. The left ventricular systolic function is normal. Mid RCA lesion, 20% stenosed.   Normal LV function without focal segmental wall motion abnormalities and ejection fraction of 55%.   Successful percutaneous cardiac intervention to the distal RCA treated with Angiosculpt scoring balloon, PTCA, and ultimate stenting with a 2.58 mm Xience Alpine DES stent postdilated to 2.51 mm with a 90% stenosis reduced to 0% and no change in the ostial PDA narrowing.  ASSESSMENT AND PLAN:  1.  Paroxysmal atrial fibrillation: Currently on amiodarone 100 mg daily, Eliquis 5 mg twice daily.  CHA2DS2-VASc of 7.  High risk medication monitoring for amiodarone with labs today.  She has minimal episodes of atrial fibrillation.  We Pier Laux continue with current management.  2.  Sick sinus syndrome: Status post  Medtronic dual-chamber pacemaker.  Device functioning appropriately.  No changes.  3.  Coronary artery disease: No current chest pain  4.  Hypertension: Currently well controlled   Current medicines are reviewed at length with the patient today.   The patient does not have concerns regarding her medicines.  none  Labs/ tests ordered today include:  Orders Placed This Encounter  Procedures   TSH   Hepatic function panel   EKG 12-Lead      Disposition:   FU with Kamy Poinsett 6 months  Signed, Iliana Hutt Meredith Leeds, MD  08/25/2021 2:50 PM     [address] [place] [id number] [phone] (office) [phone] (fax)

## 2021-08-26 LAB — HEPATIC FUNCTION PANEL
ALT: 21 IU/L (ref 0–32)
AST: 12 IU/L (ref 0–40)
Albumin: 4.2 g/dL (ref 3.7–4.7)
Alkaline Phosphatase: 102 IU/L (ref 44–121)
Bilirubin Total: 0.3 mg/dL (ref 0.0–1.2)
Bilirubin, Direct: 0.11 mg/dL (ref 0.00–0.40)
Total Protein: 6.2 g/dL (ref 6.0–8.5)

## 2021-08-26 LAB — TSH: TSH: 0.183 u[IU]/mL — ABNORMAL LOW (ref 0.450–4.500)

## 2021-08-29 ENCOUNTER — Other Ambulatory Visit: Payer: Self-pay

## 2021-08-29 ENCOUNTER — Ambulatory Visit (HOSPITAL_COMMUNITY)
Admission: RE | Admit: 2021-08-29 | Discharge: 2021-08-29 | Disposition: A | Discharge status: Home / Self Care | Payer: HMO | Source: Ambulatory Visit | Attending: Adult Health | Admitting: Adult Health

## 2021-08-29 DIAGNOSIS — Z1231 Encounter for screening mammogram for malignant neoplasm of breast: Secondary | ICD-10-CM | POA: Diagnosis not present

## 2021-09-09 ENCOUNTER — Telehealth: Payer: Self-pay

## 2021-09-09 ENCOUNTER — Other Ambulatory Visit: Payer: Self-pay | Admitting: Internal Medicine

## 2021-09-09 DIAGNOSIS — L821 Other seborrheic keratosis: Secondary | ICD-10-CM | POA: Diagnosis not present

## 2021-09-09 DIAGNOSIS — L718 Other rosacea: Secondary | ICD-10-CM | POA: Diagnosis not present

## 2021-09-09 DIAGNOSIS — C4401 Basal cell carcinoma of skin of lip: Secondary | ICD-10-CM | POA: Diagnosis not present

## 2021-09-09 DIAGNOSIS — C44319 Basal cell carcinoma of skin of other parts of face: Secondary | ICD-10-CM | POA: Diagnosis not present

## 2021-09-09 MED ORDER — LEVOTHYROXINE SODIUM 112 MCG PO TABS: ORAL_TABLET | ORAL | 3 refills | Status: DC

## 2021-09-09 NOTE — Telephone Encounter (Signed)
Left detailed message for Pt advising TSH was low.  Appears Pt is on levothyroxine prescribe by Philemon Kingdom. ? ?Will forward lab results to PCP. ?

## 2021-09-09 NOTE — Telephone Encounter (Signed)
-----   Message from Will Meredith Leeds, MD sent at 08/29/2021  7:45 AM EST ----- ?TSH low. Needs PCP follow up. Continue amiodarone. ? ?

## 2021-09-22 ENCOUNTER — Ambulatory Visit (INDEPENDENT_AMBULATORY_CARE_PROVIDER_SITE_OTHER): Payer: HMO | Admitting: Ophthalmology

## 2021-09-22 ENCOUNTER — Encounter (INDEPENDENT_AMBULATORY_CARE_PROVIDER_SITE_OTHER): Payer: Self-pay | Admitting: Ophthalmology

## 2021-09-22 ENCOUNTER — Encounter (INDEPENDENT_AMBULATORY_CARE_PROVIDER_SITE_OTHER): Payer: HMO | Admitting: Ophthalmology

## 2021-09-22 DIAGNOSIS — H43311 Vitreous membranes and strands, right eye: Secondary | ICD-10-CM

## 2021-09-22 DIAGNOSIS — H353124 Nonexudative age-related macular degeneration, left eye, advanced atrophic with subfoveal involvement: Secondary | ICD-10-CM | POA: Diagnosis not present

## 2021-09-22 DIAGNOSIS — E113411 Type 2 diabetes mellitus with severe nonproliferative diabetic retinopathy with macular edema, right eye: Secondary | ICD-10-CM

## 2021-09-22 DIAGNOSIS — H34831 Tributary (branch) retinal vein occlusion, right eye, with macular edema: Secondary | ICD-10-CM

## 2021-09-22 MED ORDER — AFLIBERCEPT 2MG/0.05ML IZ SOLN FOR KALEIDOSCOPE
2.0000 mg | INTRAVITREAL | Status: AC | PRN
  Administered 2021-09-22: 2 mg via INTRAVITREAL

## 2021-09-22 NOTE — Progress Notes (Signed)
? ? ?09/22/2021 ? ?  ? ?CHIEF COMPLAINT ?Patient presents for  ?Chief Complaint  ?Patient presents with  ? Branch Retinal Vein Occlusion  ? ? ? ? ?HISTORY OF PRESENT ILLNESS: ?Virginia Huber is a 76 y.o. female who presents to the clinic today for:  ? ?HPI   ?5 weeks for dilate, OD EYLEA OCT. ?Pt stated no changes in vision but has noticed a floater in the right eye. ?Pt denies FOL. ?Pt had a skin cancer on lip. ? ? ?Last edited by Silvestre Moment on 09/22/2021  9:38 AM.  ?  ? ? ?Referring physician: ?Sharilyn Sites, MD ?[address] ?[place],  [place] [address] ? ?HISTORICAL INFORMATION:  ? ?Selected notes from the [place] ?  ? ?Lab Results  ?Component Value Date  ? HGBA1C 7.5 (A) 06/06/2021  ?  ? ?CURRENT MEDICATIONS: ?No current outpatient medications on file. (Ophthalmic Drugs)  ? ?No current facility-administered medications for this visit. (Ophthalmic Drugs)  ? ?Current Outpatient Medications (Other)  ?Medication Sig  ? acetaminophen (TYLENOL) 325 MG tablet Take 650 mg by mouth every 6 (six) hours as needed for headache.  ? amiodarone (PACERONE) 200 MG tablet Take 0.5 tablets (100 mg total) by mouth daily.  ? apixaban (ELIQUIS) 5 MG TABS tablet Take 1 tablet (5 mg total) by mouth 2 (two) times daily.  ? atorvastatin (LIPITOR) 80 MG tablet TAKE ONE TABLET BY MOUTH EVERY DAY AT 6:00PM  ? carvedilol (COREG) 25 MG tablet Take 12.5 mg by mouth 2 (two) times daily.  ? Cholecalciferol (VITAMIN D3 PO) Take 2,000 Units by mouth every evening.  ? clopidogrel (PLAVIX) 75 MG tablet TAKE ONE TABLET ('75MG'$  TOTAL) BY MOUTH DAILY  ? Continuous Blood Gluc Receiver (FREESTYLE LIBRE 2 READER) DEVI 1 each by Does not apply route daily.  ? Continuous Blood Gluc Sensor (FREESTYLE LIBRE 2 SENSOR) MISC USE ONE FOR FOURTEEN DAYS  ? cyanocobalamin (,VITAMIN B-12,) 1000 MCG/ML injection Inject 1,000 mcg into the muscle every 30 (thirty) days. Last injection was around 08/02/19  ? diltiazem (TIAZAC) 360 MG 24 hr capsule Take 360 mg by  mouth every morning.  ? furosemide (LASIX) 40 MG tablet TAKE ONE AND HALF TABLET ('60MG'$  TOTAL) BY MOUTH IN THE MORNING AND ONE TABLET IN THE EVENING ('40MG'$  TOTAL)  ? glucose blood (FREESTYLE PRECISION NEO TEST) test strip Use as instructed  ? hydrALAZINE (APRESOLINE) 50 MG tablet Take 50 mg by mouth in the morning and at bedtime.  ? insulin NPH-regular Human (NOVOLIN 70/30) (70-30) 100 UNIT/ML injection INJECT 35 UNITS BEFORE BREAKFAST AND 35 UNITS BEFORE SUPPER (Patient taking differently: INJECT 25 UNITS BEFORE BREAKFAST AND 35 UNITS BEFORE SUPPER)  ? levothyroxine (SYNTHROID) 112 MCG tablet TAKE ONE TABLET BY MOUTH ONCE DAILY *New DOSE  ? NEOMYCIN-POLYMYXIN-HYDROCORTISONE (CORTISPORIN) 1 % SOLN OTIC solution Apply 1-2 drops to nail once daily after shower (Patient not taking: Reported on 07/28/2021)  ? nitroGLYCERIN (NITROSTAT) 0.4 MG SL tablet Place 1 tablet (0.4 mg total) under the tongue every 5 (five) minutes as needed for chest pain.  ? olmesartan (BENICAR) 40 MG tablet Take 40 mg by mouth in the morning.  ? potassium chloride SA (KLOR-CON) 20 MEQ tablet TAKE ONE (1) TABLET BY MOUTH EVERY DAY (Patient taking differently: 20 mEq every evening.)  ? rOPINIRole (REQUIP) 0.5 MG tablet Take 0.5 mg by mouth at bedtime.   ? Semaglutide, 2 MG/DOSE, 8 MG/3ML SOPN Inject 2 mg as directed once a week.  ? ?No current facility-administered  medications for this visit. (Other)  ? ? ? ? ?REVIEW OF SYSTEMS: ?ROS   ?Negative for: Constitutional, Gastrointestinal, Neurological, Skin, Genitourinary, Musculoskeletal, HENT, Endocrine, Cardiovascular, Eyes, Respiratory, Psychiatric, Allergic/Imm, Heme/Lymph ?Last edited by Silvestre Moment on 09/22/2021  9:38 AM.  ?  ? ? ? ?ALLERGIES ?Allergies  ?Allergen Reactions  ? Penicillins Hives  ?  Has patient had a PCN reaction causing immediate rash, facial/tongue/throat swelling, SOB or lightheadedness with hypotension: Yes ?Has patient had a PCN reaction causing severe rash involving mucus membranes  or skin necrosis: No ?Has patient had a PCN reaction that required hospitalization No ?Has patient had a PCN reaction occurring within the last 10 years: No ?If all of the above answers are "NO", then may proceed with Cephalosporin use.  ? Percocet [Oxycodone-Acetaminophen] Nausea And Vomiting  ? ? ?PAST MEDICAL HISTORY ?Past Medical History:  ?Diagnosis Date  ? (HFpEF) heart failure with preserved ejection fraction ([place])   ? Advanced nonexudative age-related macular degeneration of left eye with subfoveal involvement 06/26/2020  ? Ongoing, accounts for acuity  ? 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 Arthritis   ? Atrial fibrillation and flutter ([place])   ? a. h/o PAF/flutter during admission in 2013 for PNA. b. PAF during adm for NSTEMI 07/2015, subsequent paroxysms since then.  ? B12 deficiency anemia   ? Branch retinal vein occlusion with macular edema of right eye 10/11/2019  ? Chronic renal failure, stage 3b ([place]) 09/23/2017  ? Coronary artery disease 11/30/2014  ? a. remote MI. b. h/o PTCA with scoring balloon to OM1 11/2014. c. NSTEMI 03/2015 s/p DES to prox-mid Cx. d. NSTEMI 07/2015 s/p scoring balloon/PTCA/DES to dRCA with PAF during that admission  ? Cutaneous lupus erythematosus   ? Early stage nonexudative age-related macular degeneration of right eye 02/27/2020  ? Essential hypertension   ? GERD (gastroesophageal reflux disease)   ? History of blood transfusion 1980's  ? 2nd surgical procedures  ? Hypercholesteremia   ? Hypothyroidism   ? Myocardial infarction ([place]) 02/2012  ? NSTEMI (non-ST elevated myocardial infarction) ([place]) 04/02/2015  ? OSA (obstructive sleep apnea) 05/13/2016  ? Ovarian tumor   ? PAD (peripheral artery disease) ([place])   ? a. s/p LE angio 2015; followed by Dr. Fletcher Anon - managed medically.  ? Pericardial effusion   ? a. 06/2016 after ppm - s/p pericardiocentesis.  ? Posterior vitreous detachment of right eye 10/11/2019  ? Retinal microaneurysm of right eye 10/11/2019  ? S/P  pericardiocentesis 06/28/2016  ? 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 Stable central retinal vein occlusion of left eye 05/13/2020  ? Tachy-brady syndrome ([place])   ? a. s/p Medtronic PPM 06/2016, c/b lead perf/pericardial effusion.  ? TIA (transient ischemic attack)   ? Type 2 diabetes with nephropathy ([place]) 02/29/2012  ? ?Past Surgical History:  ?Procedure Laterality Date  ? ABDOMINAL AORTAGRAM N/A 01/03/2014  ? Procedure: ABDOMINAL AORTAGRAM;  Surgeon: Wellington Hampshire, MD;  Location: [facility] CATH LAB;  Service: Cardiovascular;  Laterality: N/A;  ? ABDOMINAL HYSTERECTOMY  06/22/1970  ? "partial"  ? APPENDECTOMY  02/20/1969  ? CARDIAC CATHETERIZATION  06/22/2006  ? Tiny OM-2 with 90% narrowing. Med tx.  ? CARDIAC CATHETERIZATION N/A 11/30/2014  ? Procedure: Left Heart Cath and Coronary Angiography;  Surgeon: Troy Sine, MD; LAD 20%, CFX 50%, OM1 95%, right PLB 30%, LV normal   ? CARDIAC CATHETERIZATION N/A 11/30/2014  ? Procedure: Coronary Balloon Angioplasty;  Surgeon: Troy Sine, MD;  Angiosculpt scoring balloon and PTCA to the OM1 reducing stenosis from 95% to less than 10%  ? CARDIAC CATHETERIZATION N/A 04/03/2015  ? Procedure: Left Heart Cath and Coronary Angiography;  Surgeon: Jolaine Artist, MD; dLAD 50%, CFX 90%, OM1 100%, PLA 15%, LVEDP 13    ? CARDIAC CATHETERIZATION N/A 04/03/2015  ? Procedure: Coronary Stent Intervention;  Surgeon: Sherren Mocha, MD; 3.0x18 mm Xience DES to the CFX    ? CARDIAC CATHETERIZATION N/A 08/02/2015  ? Procedure: Left Heart Cath and Coronary Angiography;  Surgeon: Troy Sine, MD;  Location: [place] CV LAB;  Service: Cardiovascular;  Laterality: N/A;  ? CARDIAC CATHETERIZATION N/A 08/02/2015  ? Procedure: Coronary Stent Intervention;  Surgeon: Troy Sine, MD;  Location: [place] CV LAB;  Service: Cardiovascular;  Laterality: N/A;  ? CARDIAC CATHETERIZATION N/A 06/25/2016  ? Procedure: Pericardiocentesis;  Surgeon: Will  Meredith Leeds, MD;  Location: [place] CV LAB;  Service: Cardiovascular;  Laterality: N/A;  ? cardiac stents    ? CARDIOVERSION N/A 12/15/2017  ? Procedure: CARDIOVERSION;  Surgeon: Thayer Headings, MD;  Niel Hummer

## 2021-09-22 NOTE — Assessment & Plan Note (Signed)
Atrophy accounts for acuity 

## 2021-09-22 NOTE — Assessment & Plan Note (Signed)
Overall controlled ?

## 2021-09-22 NOTE — Assessment & Plan Note (Signed)
Condition resolved 

## 2021-09-22 NOTE — Assessment & Plan Note (Signed)
Superimposed upon NPDR, now vastly improved CME from [facility] after recent change from [facility] to [facility] ? ?Postinjection #1 on Eylea ?

## 2021-10-02 ENCOUNTER — Ambulatory Visit: Payer: HMO | Admitting: Gastroenterology

## 2021-10-08 ENCOUNTER — Ambulatory Visit (INDEPENDENT_AMBULATORY_CARE_PROVIDER_SITE_OTHER): Payer: HMO

## 2021-10-08 DIAGNOSIS — I495 Sick sinus syndrome: Secondary | ICD-10-CM

## 2021-10-09 ENCOUNTER — Encounter: Payer: Self-pay | Admitting: Internal Medicine

## 2021-10-09 ENCOUNTER — Ambulatory Visit (INDEPENDENT_AMBULATORY_CARE_PROVIDER_SITE_OTHER): Payer: PPO | Admitting: Internal Medicine

## 2021-10-09 VITALS — BP 120/78 | HR 83 | Ht 63.0 in | Wt 141.8 lb

## 2021-10-09 DIAGNOSIS — E785 Hyperlipidemia, unspecified: Secondary | ICD-10-CM

## 2021-10-09 DIAGNOSIS — E1165 Type 2 diabetes mellitus with hyperglycemia: Secondary | ICD-10-CM | POA: Diagnosis not present

## 2021-10-09 DIAGNOSIS — E1159 Type 2 diabetes mellitus with other circulatory complications: Secondary | ICD-10-CM | POA: Diagnosis not present

## 2021-10-09 DIAGNOSIS — E039 Hypothyroidism, unspecified: Secondary | ICD-10-CM

## 2021-10-09 LAB — CUP PACEART REMOTE DEVICE CHECK
Battery Remaining Longevity: 68 mo
Battery Voltage: 3 V
Brady Statistic AP VP Percent: 0.03 %
Brady Statistic AP VS Percent: 34.8 %
Brady Statistic AS VP Percent: 0.11 %
Brady Statistic AS VS Percent: 65.06 %
Brady Statistic RA Percent Paced: 34.82 %
Brady Statistic RV Percent Paced: 0.14 %
Date Time Interrogation Session: 20230420105301
Implantable Lead Implant Date: 20180104
Implantable Lead Implant Date: 20180104
Implantable Lead Location: 753859
Implantable Lead Location: 753860
Implantable Lead Model: 5076
Implantable Lead Model: 5076
Implantable Pulse Generator Implant Date: 20180104
Lead Channel Impedance Value: 342 Ohm
Lead Channel Impedance Value: 380 Ohm
Lead Channel Impedance Value: 399 Ohm
Lead Channel Impedance Value: 475 Ohm
Lead Channel Pacing Threshold Amplitude: 0.75 V
Lead Channel Pacing Threshold Amplitude: 1 V
Lead Channel Pacing Threshold Pulse Width: 0.4 ms
Lead Channel Pacing Threshold Pulse Width: 0.4 ms
Lead Channel Sensing Intrinsic Amplitude: 1.625 mV
Lead Channel Sensing Intrinsic Amplitude: 1.625 mV
Lead Channel Sensing Intrinsic Amplitude: 14 mV
Lead Channel Sensing Intrinsic Amplitude: 14 mV
Lead Channel Setting Pacing Amplitude: 2 V
Lead Channel Setting Pacing Amplitude: 2.5 V
Lead Channel Setting Pacing Pulse Width: 0.4 ms
Lead Channel Setting Sensing Sensitivity: 2.8 mV

## 2021-10-09 LAB — POCT GLYCOSYLATED HEMOGLOBIN (HGB A1C): Hemoglobin A1C: 6.7 % — AB (ref 4.0–5.6)

## 2021-10-09 LAB — TSH: TSH: 1.18 u[IU]/mL (ref 0.35–5.50)

## 2021-10-09 LAB — T4, FREE: Free T4: 1.7 ng/dL — ABNORMAL HIGH (ref 0.60–1.60)

## 2021-10-09 MED ORDER — NOVOLIN 70/30 (70-30) 100 UNIT/ML ~~LOC~~ SUSP
SUBCUTANEOUS | 3 refills | Status: DC
Start: 2021-10-09 — End: 2021-12-18

## 2021-10-09 NOTE — Patient Instructions (Addendum)
Please decrease: ?- NPH/R 70/30 25 units in am and 15 units before dinner ? ?Continue: ?- Ozempic 2 mg weekly ? ?Continue Levothyroxine 112 mcg daily. ? ?Take the thyroid hormone every day, with water, at least 30 minutes before breakfast, separated by at least 4 hours from: ?- acid reflux medications ?- calcium ?- iron ?- multivitamins ? ? Please stop at the lab. ? ?Please return in 4 months. ?

## 2021-10-09 NOTE — Progress Notes (Signed)
Patient ID: Virginia Huber, female   DOB: [birth year], 76 y.o.   MRN: [id number]  ? ?This visit occurred during the SARS-CoV-2 public health emergency.  Safety protocols were in place, including screening questions prior to the visit, additional usage of staff PPE, and extensive cleaning of exam room while observing appropriate contact time as indicated for disinfecting solutions.   ? ?HPI: ?Virginia Huber is a 76 y.o.-year-old female, initially referred by her PCP, Dr.Golding, returning for follow-up for DM2, dx in ~2005, insulin-dependent since 2006, uncontrolled, with complications (CAD, s/p NSTEMI, s/p PTCA and DES; CHF; PAF/flutter; PAD; CKD; cerebrovascular disease - s/p TIA; PN).  She previously saw my colleague, Dr. Loanne Drilling.  Our last visit was 4 months ago. ? ?Interim history: ?She continues to have blurry vision and getting intraocular injections. ?She has onychomycosis and sees podiatry. ?No increased urination, nausea, chest pain. She has dizziness - not orthostatic. ? ?DM2: ?Reviewed HbA1c levels: ?Lab Results  ?Component Value Date  ? HGBA1C 7.5 (A) 06/06/2021  ? HGBA1C 7.7 (A) 02/04/2021  ? HGBA1C 7.4 (A) 10/22/2020  ? HGBA1C 6.7 (A) 07/23/2020  ? HGBA1C 7.3 (A) 04/15/2020  ? HGBA1C 8.7 (A) 02/14/2020  ? HGBA1C 7.7 12/04/2019  ? HGBA1C 7.0 (H) 08/24/2019  ? HGBA1C 6.8 (H) 12/17/2017  ? HGBA1C 6.6 (H) 08/05/2017  ? ?Pt is on a regimen of: ?- NPH/R 70/30 20-22 >> 28 units in am and 28-30 >> 24-26 >> 25 units before dinner >> 25-28 units in am and 25 units before dinner ?- Trulicity 3 mg weekly >> Ozempic 2 mg weekly -started 05/2021 ?No FH of MTC or personal h/o pancreatitis. ?She was on Metformin >> stopped b/c CKD. ? ?Pt checks her sugars 4x a day with the Libre 2 CGM: ? ? ?Previously: ? ? ?Previously: ? ? ?Lowest sugar was 59 (CGM) >> 50s (sensor - higher on glucometer: 70) >> 70 ; she has hypoglycemia awareness in the 70s.  ?Highest sugar was 208 >> 200 >> 200s. ? ?Glucometer: Freestyle  lite ? ?Patient starting to improve her meals after her last HbA1c returned: ?- Brunch: scrambled egg + bacon + rye bread or cheerios + skim milk ?- Dinner: meat - grilled + veggies ?- Snacks: seldom - apple ?Drinks occasional diet drinks ? ?-+ CKD- Dr. Theador Hawthorne, last BUN/creatinine:  ?Lab Results  ?Component Value Date  ? BUN 25 (H) 05/29/2021  ? BUN 30 (H) 08/22/2020  ? CREATININE 1.58 (H) 05/29/2021  ? CREATININE 1.79 (H) 08/22/2020  ?06/24/2020: BUN/creatinine 24/1.7 ?On Benicar 40. ? ?-+ HL; last set of lipids: ?07/18/2021: 126/160/41/58 ?07/17/2020: 121/146/38/58 ?Lab Results  ?Component Value Date  ? CHOL 166 08/05/2017  ? HDL 43 (L) 08/05/2017  ? [place] 96 08/05/2017  ? TRIG 160 (H) 08/05/2017  ? CHOLHDL 3.9 08/05/2017  ?On Lipitor 80. ? ?- last eye exam was on 09/22/2021: Severe NPDR OD + macular edema. Dr Zadie Rhine.  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  She has a history of BRAO.  She had eye surgery 03/2021 >> vision is better. ? ?- she has numbness and tingling in her feet.  Latest foot exam was performed by podiatry 08/07/2021. ? ?Pt has FH of DM in M and her family. ? ?Hypothyroidism: ? ?Pt is on levothyroxine  112 mcg daily (dose decreased to 08/2021), taken: ?- in am ?- fasting ?- at least 30 min from b'fast ?- no calcium ?- no iron ?- no multivitamins ?-  no PPIs ?- not on Biotin ? ?TSH levels were reviewed: ?Lab Results  ?Component Value Date  ? TSH 0.183 (L) 08/25/2021  ? TSH 0.40 02/04/2021  ? TSH 0.22 (L) 10/22/2020  ? TSH 0.233 (L) 08/22/2020  ? TSH 2.410 02/22/2020  ? TSH 0.424 (L) 08/15/2019  ? TSH 0.059 (L) 12/17/2017  ? TSH 0.05 (L) 12/06/2017  ? TSH 11.574 (H) 09/23/2017  ? TSH 12.65 (H) 08/05/2017  ? ?She has an extensive cardiac history.  Besides mentioned in HPI, she also has a history of heart block, tachybradycardia syndrome, pericardial effusion (history of tamponade).  She is on amiodarone, Eliquis, Plavix, Lasix.   ? ?ROS: ?+ see HPI ? ?I  reviewed pt's medications, allergies, PMH, social hx, family hx, and changes were documented in the history of present illness. Otherwise, unchanged from my initial visit note. ? ?Past Medical History:  ?Diagnosis Date  ? (HFpEF) heart failure with preserved ejection fraction ([place])   ? Advanced nonexudative age-related macular degeneration of left eye with subfoveal involvement 06/26/2020  ? Ongoing, accounts for acuity  ? 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 Arthritis   ? Atrial fibrillation and flutter ([place])   ? a. h/o PAF/flutter during admission in 2013 for PNA. b. PAF during adm for NSTEMI 07/2015, subsequent paroxysms since then.  ? B12 deficiency anemia   ? Branch retinal vein occlusion with macular edema of right eye 10/11/2019  ? Chronic renal failure, stage 3b ([place]) 09/23/2017  ? Coronary artery disease 11/30/2014  ? a. remote MI. b. h/o PTCA with scoring balloon to OM1 11/2014. c. NSTEMI 03/2015 s/p DES to prox-mid Cx. d. NSTEMI 07/2015 s/p scoring balloon/PTCA/DES to dRCA with PAF during that admission  ? Cutaneous lupus erythematosus   ? Early stage nonexudative age-related macular degeneration of right eye 02/27/2020  ? Essential hypertension   ? GERD (gastroesophageal reflux disease)   ? History of blood transfusion 1980's  ? 2nd surgical procedures  ? Hypercholesteremia   ? Hypothyroidism   ? Myocardial infarction ([place]) 02/2012  ? NSTEMI (non-ST elevated myocardial infarction) ([place]) 04/02/2015  ? OSA (obstructive sleep apnea) 05/13/2016  ? Ovarian tumor   ? PAD (peripheral artery disease) ([place])   ? a. s/p LE angio 2015; followed by Dr. Fletcher Anon - managed medically.  ? Pericardial effusion   ? a. 06/2016 after ppm - s/p pericardiocentesis.  ? Posterior vitreous detachment of right eye 10/11/2019  ? Retinal microaneurysm of right eye 10/11/2019  ? S/P pericardiocentesis 06/28/2016  ? 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 Stable central retinal vein occlusion of  left eye 05/13/2020  ? Tachy-brady syndrome ([place])   ? a. s/p Medtronic PPM 06/2016, c/b lead perf/pericardial effusion.  ? TIA (transient ischemic attack)   ? Type 2 diabetes with nephropathy ([place]) 02/29/2012  ? ?Past Surgical History:  ?Procedure Laterality Date  ? ABDOMINAL AORTAGRAM N/A 01/03/2014  ? Procedure: ABDOMINAL AORTAGRAM;  Surgeon: Wellington Hampshire, MD;  Location: [facility] CATH LAB;  Service: Cardiovascular;  Laterality: N/A;  ? ABDOMINAL HYSTERECTOMY  06/22/1970  ? "partial"  ? APPENDECTOMY  02/20/1969  ? CARDIAC CATHETERIZATION  06/22/2006  ? Tiny OM-2 with 90% narrowing. Med tx.  ? CARDIAC CATHETERIZATION N/A 11/30/2014  ? Procedure: Left Heart Cath and Coronary Angiography;  Surgeon: Troy Sine, MD; LAD 20%, CFX 50%, OM1 95%, right PLB 30%, LV normal   ? CARDIAC CATHETERIZATION N/A 11/30/2014  ? Procedure: Coronary Balloon Angioplasty;  Surgeon: Marcello Moores  Floyce Stakes, MD;  Angiosculpt scoring balloon and PTCA to the OM1 reducing stenosis from 95% to less than 10%  ? CARDIAC CATHETERIZATION N/A 04/03/2015  ? Procedure: Left Heart Cath and Coronary Angiography;  Surgeon: Jolaine Artist, MD; dLAD 50%, CFX 90%, OM1 100%, PLA 15%, LVEDP 13    ? CARDIAC CATHETERIZATION N/A 04/03/2015  ? Procedure: Coronary Stent Intervention;  Surgeon: Sherren Mocha, MD; 3.0x18 mm Xience DES to the CFX    ? CARDIAC CATHETERIZATION N/A 08/02/2015  ? Procedure: Left Heart Cath and Coronary Angiography;  Surgeon: Troy Sine, MD;  Location: [facility] CV LAB;  Service: Cardiovascular;  Laterality: N/A;  ? CARDIAC CATHETERIZATION N/A 08/02/2015  ? Procedure: Coronary Stent Intervention;  Surgeon: Troy Sine, MD;  Location: [facility] CV LAB;  Service: Cardiovascular;  Laterality: N/A;  ? CARDIAC CATHETERIZATION N/A 06/25/2016  ? Procedure: Pericardiocentesis;  Surgeon: Will Meredith Leeds, MD;  Location: [facility] CV LAB;  Service: Cardiovascular;  Laterality: N/A;  ? cardiac stents    ? CARDIOVERSION N/A 12/15/2017  ?  Procedure: CARDIOVERSION;  Surgeon: Thayer Headings, MD;  Location: [facility];  Service: Cardiovascular;  Laterality: N/A;  ? CHOLECYSTECTOMY OPEN  02/20/1989  ? COLONOSCOPY  06/23/2003  ? Dr. Laural Golden: pancolonic di

## 2021-10-14 DIAGNOSIS — X32XXXD Exposure to sunlight, subsequent encounter: Secondary | ICD-10-CM | POA: Diagnosis not present

## 2021-10-14 DIAGNOSIS — Z08 Encounter for follow-up examination after completed treatment for malignant neoplasm: Secondary | ICD-10-CM | POA: Diagnosis not present

## 2021-10-14 DIAGNOSIS — Z85828 Personal history of other malignant neoplasm of skin: Secondary | ICD-10-CM | POA: Diagnosis not present

## 2021-10-14 DIAGNOSIS — L570 Actinic keratosis: Secondary | ICD-10-CM | POA: Diagnosis not present

## 2021-10-16 ENCOUNTER — Ambulatory Visit (INDEPENDENT_AMBULATORY_CARE_PROVIDER_SITE_OTHER): Payer: HMO | Admitting: Sports Medicine

## 2021-10-16 ENCOUNTER — Encounter: Payer: Self-pay | Admitting: Sports Medicine

## 2021-10-16 DIAGNOSIS — Z7901 Long term (current) use of anticoagulants: Secondary | ICD-10-CM

## 2021-10-16 DIAGNOSIS — L600 Ingrowing nail: Secondary | ICD-10-CM

## 2021-10-16 DIAGNOSIS — M79674 Pain in right toe(s): Secondary | ICD-10-CM

## 2021-10-16 DIAGNOSIS — M79675 Pain in left toe(s): Secondary | ICD-10-CM

## 2021-10-16 DIAGNOSIS — B351 Tinea unguium: Secondary | ICD-10-CM

## 2021-10-16 DIAGNOSIS — E119 Type 2 diabetes mellitus without complications: Secondary | ICD-10-CM

## 2021-10-16 NOTE — Progress Notes (Signed)
Subjective: ?Virginia Huber is a 76 y.o. female patient with history of diabetes who presents to office today complaining of long,mildly painful nails while ambulating in shoes; unable to trim. Reports that she has some soreness at the right great toe but states she wants to wait on getting ingrown nail procedure.  No other pedal complaints noted. ? ?Blood sugar this morning not recorded ?A1c 7.3 ?Sharilyn Sites, MD last visit over a year ago ? ?Patient Active Problem List  ? Diagnosis Date Noted  ? History of colonic polyps 05/13/2021  ? Vitreous membranes and strands, right 01/21/2021  ? Advanced nonexudative age-related macular degeneration of left eye with subfoveal involvement 06/26/2020  ? Stable central retinal vein occlusion of left eye 05/13/2020  ? Early stage nonexudative age-related macular degeneration of right eye 02/27/2020  ? OSA on CPAP 02/12/2020  ? Branch retinal vein occlusion with macular edema of right eye 10/11/2019  ? Severe nonproliferative diabetic retinopathy of right eye, with macular edema, associated with type 2 diabetes mellitus ([place]) 10/11/2019  ? Right retinal defect 10/11/2019  ? Posterior vitreous detachment of right eye 10/11/2019  ? Retinal microaneurysm of right eye 10/11/2019  ? CHF exacerbation ([place]) 08/25/2019  ? Acute exacerbation of CHF (congestive heart failure) ([place]) 08/24/2019  ? Acute on chronic diastolic HF (heart failure) ([place]) 12/17/2017  ? GERD (gastroesophageal reflux disease) 12/17/2017  ? Typical atrial flutter ([place])   ? 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 Paroxysmal atrial fibrillation ([place]) 12/08/2017  ? 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 Chronic renal failure, stage 3b ([place]) 09/23/2017  ? Fever 09/23/2017  ? S/P pericardiocentesis 06/28/2016  ? Acute blood loss anemia 06/28/2016  ? Pericardial effusion 06/26/2016  ? Tachy-brady syndrome ([place]) 06/25/2016  ? Tamponade   ? Bradycardia 06/14/2016  ? Junctional  bradycardia   ? Coronary artery disease involving coronary bypass graft of native heart with unstable angina pectoris ([place])   ? Acute diastolic CHF (congestive heart failure), NYHA class 3 ([place])   ? Systolic congestive heart failure ([place]) 05/13/2016  ? Multiple and bilateral precerebral artery syndromes 05/13/2016  ? OSA (obstructive sleep apnea) 05/13/2016  ? Chronic diarrhea 12/25/2015  ? Chest pain 08/02/2015  ? Atrial fibrillation with rapid ventricular response ([place])   ? Pain with urination 05/08/2015  ? NSTEMI (non-ST elevated myocardial infarction) ([place]) 04/02/2015  ? CAD in native artery 11/30/2014  ? 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   ? PAD (peripheral artery disease) ([place]) 12/26/2013  ? Superficial fungus infection of skin 06/29/2013  ? UTI (urinary tract infection) 05/08/2013  ? Hypokalemia 03/05/2012  ? B12 deficiency anemia 03/02/2012  ? Bronchospasm 03/02/2012  ? Community acquired bacterial pneumonia 03/01/2012  ? Acute respiratory failure with hypoxia ([place]) 03/01/2012  ? Type 2 diabetes with nephropathy ([place]) 02/29/2012  ? Hypothyroidism 02/28/2012  ? RLQ abdominal pain 11/24/2010  ? OVERWEIGHT/OBESITY 06/03/2010  ? Essential hypertension 06/03/2010  ? Overweight 06/03/2010  ? Mixed hyperlipidemia 12/27/2009  ? Palpitations 05/17/2009  ? FRACTURE, TOE 12/06/2007  ? ?Current Outpatient Medications on File Prior to Visit  ?Medication Sig Dispense Refill  ? acetaminophen (TYLENOL) 325 MG tablet Take 650 mg by mouth every 6 (six) hours as needed for headache.    ? amiodarone (PACERONE) 200 MG tablet Take 0.5 tablets (100 mg total) by mouth daily. 45 tablet 1  ? apixaban (ELIQUIS) 5 MG TABS tablet Take 1 tablet (5 mg total)  by mouth 2 (two) times daily. 28 tablet 0  ? atorvastatin (LIPITOR) 80 MG tablet TAKE ONE TABLET BY MOUTH EVERY DAY AT 6:00PM 90 tablet 3  ? carvedilol (COREG) 25 MG tablet Take 12.5 mg by mouth 2 (two) times daily.    ?  Cholecalciferol (VITAMIN D3 PO) Take 2,000 Units by mouth every evening.    ? clopidogrel (PLAVIX) 75 MG tablet TAKE ONE TABLET ('75MG'$  TOTAL) BY MOUTH DAILY 90 tablet 3  ? Continuous Blood Gluc Receiver (FREESTYLE LIBRE 2 READER) DEVI 1 each by Does not apply route daily. 1 each 0  ? Continuous Blood Gluc Sensor (FREESTYLE LIBRE 2 SENSOR) MISC USE ONE FOR FOURTEEN DAYS 6 each 3  ? cyanocobalamin (,VITAMIN B-12,) 1000 MCG/ML injection Inject 1,000 mcg into the muscle every 30 (thirty) days. Last injection was around 08/02/19    ? diltiazem (TIAZAC) 360 MG 24 hr capsule Take 360 mg by mouth every morning.    ? furosemide (LASIX) 40 MG tablet TAKE ONE AND HALF TABLET ('60MG'$  TOTAL) BY MOUTH IN THE MORNING AND ONE TABLET IN THE EVENING ('40MG'$  TOTAL) 180 tablet 4  ? glucose blood (FREESTYLE PRECISION NEO TEST) test strip Use as instructed 100 each 3  ? hydrALAZINE (APRESOLINE) 50 MG tablet Take 50 mg by mouth in the morning and at bedtime.    ? insulin NPH-regular Human (NOVOLIN 70/30) (70-30) 100 UNIT/ML injection INJECT 25 UNITS BEFORE BREAKFAST AND 15 UNITS BEFORE SUPPER 45 mL 3  ? levothyroxine (SYNTHROID) 112 MCG tablet TAKE ONE TABLET BY MOUTH ONCE DAILY *New DOSE 45 tablet 3  ? NEOMYCIN-POLYMYXIN-HYDROCORTISONE (CORTISPORIN) 1 % SOLN OTIC solution Apply 1-2 drops to nail once daily after shower (Patient not taking: Reported on 07/28/2021) 10 mL 0  ? nitroGLYCERIN (NITROSTAT) 0.4 MG SL tablet Place 1 tablet (0.4 mg total) under the tongue every 5 (five) minutes as needed for chest pain. 25 tablet 0  ? olmesartan (BENICAR) 40 MG tablet Take 40 mg by mouth in the morning.    ? potassium chloride SA (KLOR-CON) 20 MEQ tablet TAKE ONE (1) TABLET BY MOUTH EVERY DAY (Patient taking differently: 20 mEq every evening.) 90 tablet 1  ? rOPINIRole (REQUIP) 0.5 MG tablet Take 0.5 mg by mouth at bedtime.   0  ? Semaglutide, 2 MG/DOSE, 8 MG/3ML SOPN Inject 2 mg as directed once a week. 9 mL 3  ? ?No current facility-administered  medications on file prior to visit.  ? ?Allergies  ?Allergen Reactions  ? Penicillins Hives  ?  Has patient had a PCN reaction causing immediate rash, facial/tongue/throat swelling, SOB or lightheadedness with hypotension: Yes ?Has patient had a PCN reaction causing severe rash involving mucus membranes or skin necrosis: No ?Has patient had a PCN reaction that required hospitalization No ?Has patient had a PCN reaction occurring within the last 10 years: No ?If all of the above answers are "NO", then may proceed with Cephalosporin use.  ? Percocet [Oxycodone-Acetaminophen] Nausea And Vomiting  ? ? ? ?Objective: ?General: Patient is awake, alert, and oriented x 3 and in no acute distress. ? ?Integument: Skin is warm, dry and supple bilateral. Nails are tender, long, thickened and dystrophic with subungual debris, consistent with onychomycosis, 1-5 bilateral. Mild curvature of bilateral hallux nails with most pain noted at the right hallux lateral border.  No significant surrounding redness, swelling, no active drainage, no acute signs of infection. No open lesions or preulcerative lesions present bilateral. Remaining integument unremarkable. ? ?Vasculature:  Dorsalis Pedis  pulse 1/4 bilateral. Posterior Tibial pulse  0/4 bilateral. Capillary fill time <3 sec 1-5 bilateral. Positive hair growth to the level of the digits.Temperature gradient within normal limits. No varicosities present bilateral. Trace edema present bilateral.  ? ?Neurology: Gross sensation present via light touch bilateral.  Protective sensation intact with Semmes Weinstein monofilament bilateral. ? ?Musculoskeletal: Asymptomatic bunion and hammertoe with cross over with hammertoe with the right second toe most elevated.  Strength within normal limits.  No other acute findings or tenderness to palpation bilateral. ? ?Assessment and Plan: ?Problem List Items Addressed This Visit   ?None ?Visit Diagnoses   ? ? Pain due to onychomycosis of toenails of  both feet    -  Primary  ? Ingrown nail      ? Diabetes mellitus without complication ([place])      ? Anticoagulated      ? ?  ? ?-Examined patient. ?-Discussed with patient treatment options for pain at left great toe

## 2021-10-19 DIAGNOSIS — I130 Hypertensive heart and chronic kidney disease with heart failure and stage 1 through stage 4 chronic kidney disease, or unspecified chronic kidney disease: Secondary | ICD-10-CM | POA: Diagnosis not present

## 2021-10-19 DIAGNOSIS — I509 Heart failure, unspecified: Secondary | ICD-10-CM | POA: Diagnosis not present

## 2021-10-19 DIAGNOSIS — N184 Chronic kidney disease, stage 4 (severe): Secondary | ICD-10-CM | POA: Diagnosis not present

## 2021-10-19 DIAGNOSIS — E1122 Type 2 diabetes mellitus with diabetic chronic kidney disease: Secondary | ICD-10-CM | POA: Diagnosis not present

## 2021-10-23 DIAGNOSIS — Z6825 Body mass index (BMI) 25.0-25.9, adult: Secondary | ICD-10-CM | POA: Diagnosis not present

## 2021-10-23 DIAGNOSIS — I952 Hypotension due to drugs: Secondary | ICD-10-CM | POA: Diagnosis not present

## 2021-10-23 DIAGNOSIS — I2510 Atherosclerotic heart disease of native coronary artery without angina pectoris: Secondary | ICD-10-CM | POA: Diagnosis not present

## 2021-10-23 DIAGNOSIS — E663 Overweight: Secondary | ICD-10-CM | POA: Diagnosis not present

## 2021-10-24 NOTE — Progress Notes (Signed)
Remote pacemaker transmission.   

## 2021-10-27 ENCOUNTER — Ambulatory Visit (INDEPENDENT_AMBULATORY_CARE_PROVIDER_SITE_OTHER): Payer: HMO | Admitting: Ophthalmology

## 2021-10-27 ENCOUNTER — Encounter (INDEPENDENT_AMBULATORY_CARE_PROVIDER_SITE_OTHER): Payer: Self-pay | Admitting: Ophthalmology

## 2021-10-27 DIAGNOSIS — E113411 Type 2 diabetes mellitus with severe nonproliferative diabetic retinopathy with macular edema, right eye: Secondary | ICD-10-CM

## 2021-10-27 DIAGNOSIS — H34831 Tributary (branch) retinal vein occlusion, right eye, with macular edema: Secondary | ICD-10-CM | POA: Diagnosis not present

## 2021-10-27 MED ORDER — AFLIBERCEPT 2MG/0.05ML IZ SOLN FOR KALEIDOSCOPE
2.0000 mg | INTRAVITREAL | Status: AC | PRN
  Administered 2021-10-27: 2 mg via INTRAVITREAL

## 2021-10-27 NOTE — Assessment & Plan Note (Signed)
Vastly improved now post injection #2 Eylea for macular BRVO with CME.  Resistant to now Avastin ? ?Repeat injection today again at 5-week interval and reevaluate next OD in 6 weeks ?

## 2021-10-27 NOTE — Progress Notes (Signed)
? ? ?10/27/2021 ? ?  ? ?CHIEF COMPLAINT ?Patient presents for  ?Chief Complaint  ?Patient presents with  ? Branch Retinal Vein Occlusion  ? ? ? ? ?HISTORY OF PRESENT ILLNESS: ?Virginia Huber is a 76 y.o. female who presents to the clinic today for:  ? ?HPI   ?5 weeks for EYLEA OCT, DILATE OD. ?Pt stated vision has been stable. ?Pt denies floaters and FOL. ? ? ? ?Last edited by Silvestre Moment on 10/27/2021  9:49 AM.  ?  ? ? ?Referring physician: ?Sharilyn Sites, MD ?[address] ?[place],  [place] [address] ? ?HISTORICAL INFORMATION:  ? ?Selected notes from the [facility] ?  ? ?Lab Results  ?Component Value Date  ? HGBA1C 6.7 (A) 10/09/2021  ?  ? ?CURRENT MEDICATIONS: ?No current outpatient medications on file. (Ophthalmic Drugs)  ? ?No current facility-administered medications for this visit. (Ophthalmic Drugs)  ? ?Current Outpatient Medications (Other)  ?Medication Sig  ? acetaminophen (TYLENOL) 325 MG tablet Take 650 mg by mouth every 6 (six) hours as needed for headache.  ? amiodarone (PACERONE) 200 MG tablet Take 0.5 tablets (100 mg total) by mouth daily.  ? apixaban (ELIQUIS) 5 MG TABS tablet Take 1 tablet (5 mg total) by mouth 2 (two) times daily.  ? atorvastatin (LIPITOR) 80 MG tablet TAKE ONE TABLET BY MOUTH EVERY DAY AT 6:00PM  ? carvedilol (COREG) 25 MG tablet Take 12.5 mg by mouth 2 (two) times daily.  ? Cholecalciferol (VITAMIN D3 PO) Take 2,000 Units by mouth every evening.  ? clopidogrel (PLAVIX) 75 MG tablet TAKE ONE TABLET ('75MG'$  TOTAL) BY MOUTH DAILY  ? Continuous Blood Gluc Receiver (FREESTYLE LIBRE 2 READER) DEVI 1 each by Does not apply route daily.  ? Continuous Blood Gluc Sensor (FREESTYLE LIBRE 2 SENSOR) MISC USE ONE FOR FOURTEEN DAYS  ? cyanocobalamin (,VITAMIN B-12,) 1000 MCG/ML injection Inject 1,000 mcg into the muscle every 30 (thirty) days. Last injection was around 08/02/19  ? diltiazem (TIAZAC) 360 MG 24 hr capsule Take 360 mg by mouth every morning.  ? furosemide (LASIX) 40 MG tablet  TAKE ONE AND HALF TABLET ('60MG'$  TOTAL) BY MOUTH IN THE MORNING AND ONE TABLET IN THE EVENING ('40MG'$  TOTAL)  ? glucose blood (FREESTYLE PRECISION NEO TEST) test strip Use as instructed  ? hydrALAZINE (APRESOLINE) 50 MG tablet Take 50 mg by mouth in the morning and at bedtime.  ? insulin NPH-regular Human (NOVOLIN 70/30) (70-30) 100 UNIT/ML injection INJECT 25 UNITS BEFORE BREAKFAST AND 15 UNITS BEFORE SUPPER  ? levothyroxine (SYNTHROID) 112 MCG tablet TAKE ONE TABLET BY MOUTH ONCE DAILY *New DOSE  ? NEOMYCIN-POLYMYXIN-HYDROCORTISONE (CORTISPORIN) 1 % SOLN OTIC solution Apply 1-2 drops to nail once daily after shower (Patient not taking: Reported on 07/28/2021)  ? nitroGLYCERIN (NITROSTAT) 0.4 MG SL tablet Place 1 tablet (0.4 mg total) under the tongue every 5 (five) minutes as needed for chest pain.  ? olmesartan (BENICAR) 40 MG tablet Take 40 mg by mouth in the morning.  ? potassium chloride SA (KLOR-CON) 20 MEQ tablet TAKE ONE (1) TABLET BY MOUTH EVERY DAY (Patient taking differently: 20 mEq every evening.)  ? rOPINIRole (REQUIP) 0.5 MG tablet Take 0.5 mg by mouth at bedtime.   ? Semaglutide, 2 MG/DOSE, 8 MG/3ML SOPN Inject 2 mg as directed once a week.  ? ?No current facility-administered medications for this visit. (Other)  ? ? ? ? ?REVIEW OF SYSTEMS: ?ROS   ?Negative for: Constitutional, Gastrointestinal, Neurological, Skin, Genitourinary, Musculoskeletal, HENT, Endocrine,  Cardiovascular, Eyes, Respiratory, Psychiatric, Allergic/Imm, Heme/Lymph ?Last edited by Silvestre Moment on 10/27/2021  9:49 AM.  ?  ? ? ? ?ALLERGIES ?Allergies  ?Allergen Reactions  ? Penicillins Hives  ?  Has patient had a PCN reaction causing immediate rash, facial/tongue/throat swelling, SOB or lightheadedness with hypotension: Yes ?Has patient had a PCN reaction causing severe rash involving mucus membranes or skin necrosis: No ?Has patient had a PCN reaction that required hospitalization No ?Has patient had a PCN reaction occurring within the last 10  years: No ?If all of the above answers are "NO", then may proceed with Cephalosporin use.  ? Percocet [Oxycodone-Acetaminophen] Nausea And Vomiting  ? ? ?PAST MEDICAL HISTORY ?Past Medical History:  ?Diagnosis Date  ? (HFpEF) heart failure with preserved ejection fraction ([place])   ? Advanced nonexudative age-related macular degeneration of left eye with subfoveal involvement 06/26/2020  ? Ongoing, accounts for acuity  ? Amiodarone induced neuropathy ([place]) 12/08/2017  ? Arthritis   ? Atrial fibrillation and flutter ([place])   ? a. h/o PAF/flutter during admission in 2013 for PNA. b. PAF during adm for NSTEMI 07/2015, subsequent paroxysms since then.  ? B12 deficiency anemia   ? Branch retinal vein occlusion with macular edema of right eye 10/11/2019  ? Chronic renal failure, stage 3b ([place]) 09/23/2017  ? Coronary artery disease 11/30/2014  ? a. remote MI. b. h/o PTCA with scoring balloon to OM1 11/2014. c. NSTEMI 03/2015 s/p DES to prox-mid Cx. d. NSTEMI 07/2015 s/p scoring balloon/PTCA/DES to dRCA with PAF during that admission  ? Cutaneous lupus erythematosus   ? Early stage nonexudative age-related macular degeneration of right eye 02/27/2020  ? Essential hypertension   ? GERD (gastroesophageal reflux disease)   ? History of blood transfusion 1980's  ? 2nd surgical procedures  ? Hypercholesteremia   ? Hypothyroidism   ? Myocardial infarction ([place]) 02/2012  ? NSTEMI (non-ST elevated myocardial infarction) ([place]) 04/02/2015  ? OSA (obstructive sleep apnea) 05/13/2016  ? Ovarian tumor   ? PAD (peripheral artery disease) ([place])   ? a. s/p LE angio 2015; followed by Dr. Fletcher Anon - managed medically.  ? Pericardial effusion   ? a. 06/2016 after ppm - s/p pericardiocentesis.  ? Posterior vitreous detachment of right eye 10/11/2019  ? Retinal microaneurysm of right eye 10/11/2019  ? S/P pericardiocentesis 06/28/2016  ? 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 Stable central retinal vein occlusion of left  eye 05/13/2020  ? Tachy-brady syndrome ([place])   ? a. s/p Medtronic PPM 06/2016, c/b lead perf/pericardial effusion.  ? TIA (transient ischemic attack)   ? Type 2 diabetes with nephropathy ([place]) 02/29/2012  ? ?Past Surgical History:  ?Procedure Laterality Date  ? ABDOMINAL AORTAGRAM N/A 01/03/2014  ? Procedure: ABDOMINAL AORTAGRAM;  Surgeon: Wellington Hampshire, MD;  Location: [facility] CATH LAB;  Service: Cardiovascular;  Laterality: N/A;  ? ABDOMINAL HYSTERECTOMY  06/22/1970  ? "partial"  ? APPENDECTOMY  02/20/1969  ? CARDIAC CATHETERIZATION  06/22/2006  ? Tiny OM-2 with 90% narrowing. Med tx.  ? CARDIAC CATHETERIZATION N/A 11/30/2014  ? Procedure: Left Heart Cath and Coronary Angiography;  Surgeon: Troy Sine, MD; LAD 20%, CFX 50%, OM1 95%, right PLB 30%, LV normal   ? CARDIAC CATHETERIZATION N/A 11/30/2014  ? Procedure: Coronary Balloon Angioplasty;  Surgeon: Troy Sine, MD;  Angiosculpt scoring balloon and PTCA to the OM1 reducing stenosis from 95% to less than 10%  ? CARDIAC CATHETERIZATION N/A 04/03/2015  ? Procedure: Left  Heart Cath and Coronary Angiography;  Surgeon: Jolaine Artist, MD; dLAD 50%, CFX 90%, OM1 100%, PLA 15%, LVEDP 13    ? CARDIAC CATHETERIZATION N/A 04/03/2015  ? Procedure: Coronary Stent Intervention;  Surgeon: Sherren Mocha, MD; 3.0x18 mm Xience DES to the CFX    ? CARDIAC CATHETERIZATION N/A 08/02/2015  ? Procedure: Left Heart Cath and Coronary Angiography;  Surgeon: Troy Sine, MD;  Location: [facility] LAB;  Service: Cardiovascular;  Laterality: N/A;  ? CARDIAC CATHETERIZATION N/A 08/02/2015  ? Procedure: Coronary Stent Intervention;  Surgeon: Troy Sine, MD;  Location: [facility] LAB;  Service: Cardiovascular;  Laterality: N/A;  ? CARDIAC CATHETERIZATION N/A 06/25/2016  ? Procedure: Pericardiocentesis;  Surgeon: Will Meredith Leeds, MD;  Location: [facility];  Service: Cardiovascular;  Laterality: N/A;  ? cardiac stents    ? CARDIOVERSION N/A 12/15/2017  ?  Procedure: CARDIOVERSION;  Surgeon: Thayer Headings, MD;  Location: [facility];  Service: Cardiovascular;  Laterality: N/A;  ? CHOLECYSTECTOMY OPEN  02/20/1989  ? COLONOSCOPY  06/23/2003  ? Dr. Laural Golden: pan

## 2021-10-29 ENCOUNTER — Encounter: Payer: Self-pay | Admitting: Gastroenterology

## 2021-10-29 ENCOUNTER — Ambulatory Visit: Payer: HMO | Admitting: Gastroenterology

## 2021-10-29 VITALS — BP 122/69 | HR 83 | Temp 97.1°F | Ht 63.0 in | Wt 143.2 lb

## 2021-10-29 DIAGNOSIS — Z860100 Personal history of colon polyps, unspecified: Secondary | ICD-10-CM

## 2021-10-29 NOTE — Patient Instructions (Signed)
You can take a capful of Miralax in 8 ounces of water each evening as needed if no bowel movement that day. ? ?Please call us with any concerns! ? ?We will see you as needed. ? ?Your next colonoscopy is recommended for 2027 if health permits! ? ?I enjoyed seeing you again today! As you know, I value our relationship and want to provide genuine, compassionate, and quality care. I welcome your feedback. If you receive a survey regarding your visit,  I greatly appreciate you taking time to fill this out. See you next time! ? ?Annitta Needs, PhD, ANP-BC ?[facility]  ? ?

## 2021-10-29 NOTE — Progress Notes (Signed)
? ? ? ? ? ?Gastroenterology Office Note   ? ? ?Primary Care Physician:  Sharilyn Sites, MD  ?Primary Gastroenterologist: Dr. Abbey Chatters ? ? ?Chief Complaint  ? ?Chief Complaint  ?Patient presents with  ? Follow-up  ? ? ? ?History of Present Illness  ? ?Virginia Huber is a 76 y.o. female presenting today in follow-up with a history of colon polyps, constipation, recent colonoscopy with pancolonic diverticulosis. Two 4-6 mm polyps in transverse colon. Sessile serrated and hyperplastic. 5 year surveillance if benefits outweigh the risks (2027).  ? ?Constipation rare. Fiber caused lots of gas. Has used Miralax in the past. No GERD. No overt GI bleeding. No other GI concerns today.  ? ? ?Having severe dizzy spells. Cardiology appt upcoming next week. No chest pain, SOB.  ? ?Past Medical History:  ?Diagnosis Date  ? (HFpEF) heart failure with preserved ejection fraction ([place])   ? Advanced nonexudative age-related macular degeneration of left eye with subfoveal involvement 06/26/2020  ? Ongoing, accounts for acuity  ? 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 Arthritis   ? Atrial fibrillation and flutter ([place])   ? a. h/o PAF/flutter during admission in 2013 for PNA. b. PAF during adm for NSTEMI 07/2015, subsequent paroxysms since then.  ? B12 deficiency anemia   ? Branch retinal vein occlusion with macular edema of right eye 10/11/2019  ? Chronic renal failure, stage 3b ([place]) 09/23/2017  ? Coronary artery disease 11/30/2014  ? a. remote MI. b. h/o PTCA with scoring balloon to OM1 11/2014. c. NSTEMI 03/2015 s/p DES to prox-mid Cx. d. NSTEMI 07/2015 s/p scoring balloon/PTCA/DES to dRCA with PAF during that admission  ? Cutaneous lupus erythematosus   ? Early stage nonexudative age-related macular degeneration of right eye 02/27/2020  ? Essential hypertension   ? GERD (gastroesophageal reflux disease)   ? History of blood transfusion 1980's  ? 2nd surgical procedures  ? Hypercholesteremia   ? Hypothyroidism   ? Myocardial  infarction ([place]) 02/2012  ? NSTEMI (non-ST elevated myocardial infarction) ([place]) 04/02/2015  ? OSA (obstructive sleep apnea) 05/13/2016  ? Ovarian tumor   ? PAD (peripheral artery disease) ([place])   ? a. s/p LE angio 2015; followed by Dr. Fletcher Anon - managed medically.  ? Pericardial effusion   ? a. 06/2016 after ppm - s/p pericardiocentesis.  ? Posterior vitreous detachment of right eye 10/11/2019  ? Retinal microaneurysm of right eye 10/11/2019  ? S/P pericardiocentesis 06/28/2016  ? 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 Stable central retinal vein occlusion of left eye 05/13/2020  ? Tachy-brady syndrome ([place])   ? a. s/p Medtronic PPM 06/2016, c/b lead perf/pericardial effusion.  ? TIA (transient ischemic attack)   ? Type 2 diabetes with nephropathy ([place]) 02/29/2012  ? ? ?Past Surgical History:  ?Procedure Laterality Date  ? ABDOMINAL AORTAGRAM N/A 01/03/2014  ? Procedure: ABDOMINAL AORTAGRAM;  Surgeon: Wellington Hampshire, MD;  Location: [facility] CATH LAB;  Service: Cardiovascular;  Laterality: N/A;  ? ABDOMINAL HYSTERECTOMY  06/22/1970  ? "partial"  ? APPENDECTOMY  02/20/1969  ? CARDIAC CATHETERIZATION  06/22/2006  ? Tiny OM-2 with 90% narrowing. Med tx.  ? CARDIAC CATHETERIZATION N/A 11/30/2014  ? Procedure: Left Heart Cath and Coronary Angiography;  Surgeon: Troy Sine, MD; LAD 20%, CFX 50%, OM1 95%, right PLB 30%, LV normal   ? CARDIAC CATHETERIZATION N/A 11/30/2014  ? Procedure: Coronary Balloon Angioplasty;  Surgeon: Troy Sine, MD;  Angiosculpt scoring balloon and PTCA to  the OM1 reducing stenosis from 95% to less than 10%  ? CARDIAC CATHETERIZATION N/A 04/03/2015  ? Procedure: Left Heart Cath and Coronary Angiography;  Surgeon: Jolaine Artist, MD; dLAD 50%, CFX 90%, OM1 100%, PLA 15%, LVEDP 13    ? CARDIAC CATHETERIZATION N/A 04/03/2015  ? Procedure: Coronary Stent Intervention;  Surgeon: Sherren Mocha, MD; 3.0x18 mm Xience DES to the CFX    ? CARDIAC CATHETERIZATION N/A  08/02/2015  ? Procedure: Left Heart Cath and Coronary Angiography;  Surgeon: Troy Sine, MD;  Location: [facility] LAB;  Service: Cardiovascular;  Laterality: N/A;  ? CARDIAC CATHETERIZATION N/A 08/02/2015  ? Procedure: Coronary Stent Intervention;  Surgeon: Troy Sine, MD;  Location: [facility];  Service: Cardiovascular;  Laterality: N/A;  ? CARDIAC CATHETERIZATION N/A 06/25/2016  ? Procedure: Pericardiocentesis;  Surgeon: Will Meredith Leeds, MD;  Location: [facility];  Service: Cardiovascular;  Laterality: N/A;  ? cardiac stents    ? CARDIOVERSION N/A 12/15/2017  ? Procedure: CARDIOVERSION;  Surgeon: Thayer Headings, MD;  Location: [facility];  Service: Cardiovascular;  Laterality: N/A;  ? CHOLECYSTECTOMY OPEN  02/20/1989  ? COLONOSCOPY  06/23/2003  ? Dr. Laural Golden: pancolonic divericula, polyp, path unknown currently  ? COLONOSCOPY  06/22/2010  ? Dr. Oneida Alar: Normal TI, scattered diverticula in entire colon, small internal hemorrhoids, normal colon biopsies. Colonoscopy in 5-10 years.   ? COLONOSCOPY WITH PROPOFOL N/A 06/02/2021  ? pancolonic diverticulosis. Two 4-6 mm polyps in transverse colon. Sessile serrated and hyperplastic. 5 year surveillance if benefits outweigh the risks.  ? COLOSTOMY  05/23/1979  ? COLOSTOMY REVERSAL  11/21/1979  ? EP IMPLANTABLE DEVICE N/A 06/25/2016  ? Procedure: Lead Revision/Repair;  Surgeon: Will Meredith Leeds, MD;  Location: [facility];  Service: Cardiovascular;  Laterality: N/A;  ? EP IMPLANTABLE DEVICE N/A 06/25/2016  ? Procedure: Pacemaker Implant;  Surgeon: Will Meredith Leeds, MD;  Location: [facility];  Service: Cardiovascular;  Laterality: N/A;  ? EXCISIONAL HEMORRHOIDECTOMY  02/20/1969  ? EYE SURGERY Left 06/22/1998  ? "branch vein occlusion"  ? EYE SURGERY Left ~ 2001  ? "smoothed out wrinkle"  ? LEFT OOPHORECTOMY  05/23/1979  ? nicked bowel, peritonitis, colostomy; colostomy reversed 1981   ? LOWER EXTREMITY ANGIOGRAM N/A  01/03/2014  ? Procedure: LOWER EXTREMITY ANGIOGRAM;  Surgeon: Wellington Hampshire, MD;  Location: [facility] CATH LAB;  Service: Cardiovascular;  Laterality: N/A;  ? Nuclear med stress test  10/21/2011  ? Small area of mild ischemia inferoapically.  ? PARTIAL HYSTERECTOMY  02/20/1969  ? left ovaries, then ovaries removed later due tumors   ? POLYPECTOMY  06/02/2021  ? Procedure: POLYPECTOMY;  Surgeon: Eloise Harman, DO;  [facility];  Service: Endoscopy;;  ? right eye surgery  03/2021  ? RIGHT OOPHORECTOMY  02/20/1969  ? ? ?Current Outpatient Medications  ?Medication Sig Dispense Refill  ? acetaminophen (TYLENOL) 325 MG tablet Take 650 mg by mouth every 6 (six) hours as needed for headache.    ? amiodarone (PACERONE) 200 MG tablet Take 0.5 tablets (100 mg total) by mouth daily. 45 tablet 1  ? apixaban (ELIQUIS) 5 MG TABS tablet Take 1 tablet (5 mg total) by mouth 2 (two) times daily. 28 tablet 0  ? atorvastatin (LIPITOR) 80 MG tablet TAKE ONE TABLET BY MOUTH EVERY DAY AT 6:00PM 90 tablet 3  ? carvedilol (COREG) 25 MG tablet Take 12.5 mg by mouth 2 (two) times daily.    ?  Cholecalciferol (VITAMIN D3 PO) Take 2,000 Units by mouth every evening.    ? clopidogrel (PLAVIX) 75 MG tablet TAKE ONE TABLET ('75MG'$  TOTAL) BY MOUTH DAILY 90 tablet 3  ? Continuous Blood Gluc Receiver (FREESTYLE LIBRE 2 READER) DEVI 1 each by Does not apply route daily. 1 each 0  ? Continuous Blood Gluc Sensor (FREESTYLE LIBRE 2 SENSOR) MISC USE ONE FOR FOURTEEN DAYS 6 each 3  ? diltiazem (TIAZAC) 360 MG 24 hr capsule Take 360 mg by mouth every morning.    ? furosemide (LASIX) 40 MG tablet TAKE ONE AND HALF TABLET ('60MG'$  TOTAL) BY MOUTH IN THE MORNING AND ONE TABLET IN THE EVENING ('40MG'$  TOTAL) 180 tablet 4  ? glucose blood (FREESTYLE PRECISION NEO TEST) test strip Use as instructed 100 each 3  ? hydrALAZINE (APRESOLINE) 50 MG tablet Take 50 mg by mouth in the morning and at bedtime.    ? insulin NPH-regular Human (NOVOLIN 70/30) (70-30) 100  UNIT/ML injection INJECT 25 UNITS BEFORE BREAKFAST AND 15 UNITS BEFORE SUPPER 45 mL 3  ? levothyroxine (SYNTHROID) 112 MCG tablet TAKE ONE TABLET BY MOUTH ONCE DAILY *New DOSE 45 tablet 3  ? nitroGLYCERIN (NITRO

## 2021-10-29 NOTE — Progress Notes (Signed)
? ?Cardiology Office Note   ? ?Date:  11/05/2021  ? ?ID:  Virginia Huber, DOB 06-Dec-1945, MRN [id number] ? ? ?PCP:  Sharilyn Sites, MD ?  ?[place]  ?Cardiologist:  Rozann Lesches, MD   ?Advanced Practice Provider:  No care team member to display ?Electrophysiologist:  Will Meredith Leeds, MD  ? ?[id number]}  ? ?Chief Complaint  ?Patient presents with  ? Dizziness  ? ? ?History of Present Illness:  ?Virginia Huber is a 76 y.o. female with history CAD status post angioplasty of the OM1 in 2016, DES to the circumflex in 2016, and DES to the RCA in 2017.  Paroxysmal atrial fibrillation with very low rhythm burden based on device interrogation.  She remains on low-dose amiodarone along with Coreg, Cardizem CD, and Eliquis, SSS s/p medtronic pacemaker, HTN, CKD 3b, HFpEF 55-60% 2021. ? ?Last saw Dr. Domenic Polite 07/28/21 and doing well.  ? ?Patient comes in with dizzy spells that have been going on for months. She is swimmy headed just standing. She isn't dizzy when she first stands up but when she walks a short distance she gets dizzy and has to sit down. She was orthostatic at Dr. Cornelia Copa 112/42 lying, 118/48 sitting 102/38 standing. When she has these spells sometimes she checks her BP and it's 92 systolic. Not orthostatic here today but hasn't taken her meds yet. Olmesartan was reduced from 40 mg to 20 mg daily. Has lost 20 lbs in the past year. ? ? ? ?Past Medical History:  ?Diagnosis Date  ? (HFpEF) heart failure with preserved ejection fraction ([place])   ? Advanced nonexudative age-related macular degeneration of left eye with subfoveal involvement 06/26/2020  ? Ongoing, accounts for acuity  ? 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 Arthritis   ? Atrial fibrillation and flutter ([place])   ? a. h/o PAF/flutter during admission in 2013 for PNA. b. PAF during adm for NSTEMI 07/2015, subsequent paroxysms since then.  ? B12 deficiency anemia   ? Branch retinal vein occlusion with macular edema  of right eye 10/11/2019  ? Chronic renal failure, stage 3b ([place]) 09/23/2017  ? Coronary artery disease 11/30/2014  ? a. remote MI. b. h/o PTCA with scoring balloon to OM1 11/2014. c. NSTEMI 03/2015 s/p DES to prox-mid Cx. d. NSTEMI 07/2015 s/p scoring balloon/PTCA/DES to dRCA with PAF during that admission  ? Cutaneous lupus erythematosus   ? Early stage nonexudative age-related macular degeneration of right eye 02/27/2020  ? Essential hypertension   ? GERD (gastroesophageal reflux disease)   ? History of blood transfusion 1980's  ? 2nd surgical procedures  ? Hypercholesteremia   ? Hypothyroidism   ? Myocardial infarction ([place]) 02/2012  ? NSTEMI (non-ST elevated myocardial infarction) ([place]) 04/02/2015  ? OSA (obstructive sleep apnea) 05/13/2016  ? Ovarian tumor   ? PAD (peripheral artery disease) ([place])   ? a. s/p LE angio 2015; followed by Dr. Fletcher Anon - managed medically.  ? Pericardial effusion   ? a. 06/2016 after ppm - s/p pericardiocentesis.  ? Posterior vitreous detachment of right eye 10/11/2019  ? Retinal microaneurysm of right eye 10/11/2019  ? S/P pericardiocentesis 06/28/2016  ? 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 Stable central retinal vein occlusion of left eye 05/13/2020  ? Tachy-brady syndrome ([place])   ? a. s/p Medtronic PPM 06/2016, c/b lead perf/pericardial effusion.  ? TIA (transient ischemic attack)   ? Type 2 diabetes with nephropathy ([place]) 02/29/2012  ? ? ?  Past Surgical History:  ?Procedure Laterality Date  ? ABDOMINAL AORTAGRAM N/A 01/03/2014  ? Procedure: ABDOMINAL AORTAGRAM;  Surgeon: Wellington Hampshire, MD;  Location: [facility] CATH LAB;  Service: Cardiovascular;  Laterality: N/A;  ? ABDOMINAL HYSTERECTOMY  06/22/1970  ? "partial"  ? APPENDECTOMY  02/20/1969  ? CARDIAC CATHETERIZATION  06/22/2006  ? Tiny OM-2 with 90% narrowing. Med tx.  ? CARDIAC CATHETERIZATION N/A 11/30/2014  ? Procedure: Left Heart Cath and Coronary Angiography;  Surgeon: Troy Sine, MD; LAD 20%,  CFX 50%, OM1 95%, right PLB 30%, LV normal   ? CARDIAC CATHETERIZATION N/A 11/30/2014  ? Procedure: Coronary Balloon Angioplasty;  Surgeon: Troy Sine, MD;  Angiosculpt scoring balloon and PTCA to the OM1 reducing stenosis from 95% to less than 10%  ? CARDIAC CATHETERIZATION N/A 04/03/2015  ? Procedure: Left Heart Cath and Coronary Angiography;  Surgeon: Jolaine Artist, MD; dLAD 50%, CFX 90%, OM1 100%, PLA 15%, LVEDP 13    ? CARDIAC CATHETERIZATION N/A 04/03/2015  ? Procedure: Coronary Stent Intervention;  Surgeon: Sherren Mocha, MD; 3.0x18 mm Xience DES to the CFX    ? CARDIAC CATHETERIZATION N/A 08/02/2015  ? Procedure: Left Heart Cath and Coronary Angiography;  Surgeon: Troy Sine, MD;  Location: [place] CV LAB;  Service: Cardiovascular;  Laterality: N/A;  ? CARDIAC CATHETERIZATION N/A 08/02/2015  ? Procedure: Coronary Stent Intervention;  Surgeon: Troy Sine, MD;  Location: [place] CV LAB;  Service: Cardiovascular;  Laterality: N/A;  ? CARDIAC CATHETERIZATION N/A 06/25/2016  ? Procedure: Pericardiocentesis;  Surgeon: Will Meredith Leeds, MD;  Location: [place] CV LAB;  Service: Cardiovascular;  Laterality: N/A;  ? cardiac stents    ? CARDIOVERSION N/A 12/15/2017  ? Procedure: CARDIOVERSION;  Surgeon: Thayer Headings, MD;  Location: [facility];  Service: Cardiovascular;  Laterality: N/A;  ? CHOLECYSTECTOMY OPEN  02/20/1989  ? COLONOSCOPY  06/23/2003  ? Dr. Laural Golden: pancolonic divericula, polyp, path unknown currently  ? COLONOSCOPY  06/22/2010  ? Dr. Oneida Alar: Normal TI, scattered diverticula in entire colon, small internal hemorrhoids, normal colon biopsies. Colonoscopy in 5-10 years.   ? COLONOSCOPY WITH PROPOFOL N/A 06/02/2021  ? pancolonic diverticulosis. Two 4-6 mm polyps in transverse colon. Sessile serrated and hyperplastic. 5 year surveillance if benefits outweigh the risks.  ? COLOSTOMY  05/23/1979  ? COLOSTOMY REVERSAL  11/21/1979  ? EP IMPLANTABLE DEVICE N/A 06/25/2016  ?  Procedure: Lead Revision/Repair;  Surgeon: Will Meredith Leeds, MD;  Location: [place] CV LAB;  Service: Cardiovascular;  Laterality: N/A;  ? EP IMPLANTABLE DEVICE N/A 06/25/2016  ? Procedure: Pacemaker Implant;  Surgeon: Will Meredith Leeds, MD;  Location: [place] CV LAB;  Service: Cardiovascular;  Laterality: N/A;  ? EXCISIONAL HEMORRHOIDECTOMY  02/20/1969  ? EYE SURGERY Left 06/22/1998  ? "branch vein occlusion"  ? EYE SURGERY Left ~ 2001  ? "smoothed out wrinkle"  ? LEFT OOPHORECTOMY  05/23/1979  ? nicked bowel, peritonitis, colostomy; colostomy reversed 1981   ? LOWER EXTREMITY ANGIOGRAM N/A 01/03/2014  ? Procedure: LOWER EXTREMITY ANGIOGRAM;  Surgeon: Wellington Hampshire, MD;  Location: [place] CATH LAB;  Service: Cardiovascular;  Laterality: N/A;  ? Nuclear med stress test  10/21/2011  ? Small area of mild ischemia inferoapically.  ? PARTIAL HYSTERECTOMY  02/20/1969  ? left ovaries, then ovaries removed later due tumors   ? POLYPECTOMY  06/02/2021  ? Procedure: POLYPECTOMY;  Surgeon: Eloise Harman, DO;  [facility];  Service: Endoscopy;;  ? right eye surgery  03/2021  ? RIGHT OOPHORECTOMY  02/20/1969  ? ? ?Current Medications: ?Current Meds  ?Medication Sig  ? acetaminophen (TYLENOL) 325 MG tablet Take 650 mg by mouth every 6 (six) hours as needed for headache.  ? amiodarone (PACERONE) 200 MG tablet Take 0.5 tablets (100 mg total) by mouth daily.  ? apixaban (ELIQUIS) 5 MG TABS tablet Take 1 tablet (5 mg total) by mouth 2 (two) times daily.  ? atorvastatin (LIPITOR) 80 MG tablet TAKE ONE TABLET BY MOUTH EVERY DAY AT 6:00PM  ? carvedilol (COREG) 25 MG tablet Take 12.5 mg by mouth 2 (two) times daily.  ? Cholecalciferol (VITAMIN D3 PO) Take 2,000 Units by mouth every evening.  ? clopidogrel (PLAVIX) 75 MG tablet TAKE ONE TABLET ('75MG'$  TOTAL) BY MOUTH DAILY  ? Continuous Blood Gluc Receiver (FREESTYLE LIBRE 2 READER) DEVI 1 each by Does not apply route daily.  ? Continuous Blood Gluc Sensor  (FREESTYLE LIBRE 2 SENSOR) MISC USE ONE FOR FOURTEEN DAYS  ? diltiazem (CARDIZEM CD) 180 MG 24 hr capsule Take 1 capsule (180 mg total) by mouth daily.  ? furosemide (LASIX) 40 MG tablet Take 1 tablet (40 mg

## 2021-11-05 ENCOUNTER — Encounter: Payer: Self-pay | Admitting: Physician Assistant

## 2021-11-05 ENCOUNTER — Ambulatory Visit (INDEPENDENT_AMBULATORY_CARE_PROVIDER_SITE_OTHER): Payer: HMO | Admitting: Physician Assistant

## 2021-11-05 VITALS — BP 146/70 | HR 79 | Ht 63.0 in | Wt 142.4 lb

## 2021-11-05 DIAGNOSIS — I129 Hypertensive chronic kidney disease with stage 1 through stage 4 chronic kidney disease, or unspecified chronic kidney disease: Secondary | ICD-10-CM | POA: Diagnosis not present

## 2021-11-05 DIAGNOSIS — E782 Mixed hyperlipidemia: Secondary | ICD-10-CM

## 2021-11-05 DIAGNOSIS — E211 Secondary hyperparathyroidism, not elsewhere classified: Secondary | ICD-10-CM | POA: Diagnosis not present

## 2021-11-05 DIAGNOSIS — E559 Vitamin D deficiency, unspecified: Secondary | ICD-10-CM | POA: Diagnosis not present

## 2021-11-05 DIAGNOSIS — I951 Orthostatic hypotension: Secondary | ICD-10-CM

## 2021-11-05 DIAGNOSIS — I480 Paroxysmal atrial fibrillation: Secondary | ICD-10-CM | POA: Diagnosis not present

## 2021-11-05 DIAGNOSIS — I10 Essential (primary) hypertension: Secondary | ICD-10-CM

## 2021-11-05 DIAGNOSIS — E1122 Type 2 diabetes mellitus with diabetic chronic kidney disease: Secondary | ICD-10-CM | POA: Diagnosis not present

## 2021-11-05 DIAGNOSIS — I495 Sick sinus syndrome: Secondary | ICD-10-CM

## 2021-11-05 DIAGNOSIS — R809 Proteinuria, unspecified: Secondary | ICD-10-CM | POA: Diagnosis not present

## 2021-11-05 DIAGNOSIS — I5032 Chronic diastolic (congestive) heart failure: Secondary | ICD-10-CM | POA: Diagnosis not present

## 2021-11-05 DIAGNOSIS — I2510 Atherosclerotic heart disease of native coronary artery without angina pectoris: Secondary | ICD-10-CM

## 2021-11-05 DIAGNOSIS — R42 Dizziness and giddiness: Secondary | ICD-10-CM

## 2021-11-05 DIAGNOSIS — N189 Chronic kidney disease, unspecified: Secondary | ICD-10-CM | POA: Diagnosis not present

## 2021-11-05 DIAGNOSIS — E1129 Type 2 diabetes mellitus with other diabetic kidney complication: Secondary | ICD-10-CM | POA: Diagnosis not present

## 2021-11-05 MED ORDER — FUROSEMIDE 40 MG PO TABS: 40.0000 mg | ORAL_TABLET | Freq: Every day | ORAL | 3 refills | Status: DC

## 2021-11-05 MED ORDER — DILTIAZEM HCL ER COATED BEADS 180 MG PO CP24: 180.0000 mg | ORAL_CAPSULE | Freq: Every day | ORAL | 3 refills | Status: DC

## 2021-11-05 NOTE — Patient Instructions (Addendum)
Medication Instructions:  ? ?Decrease Diltiazem to 180 mg Daily  ?Decrease Lasix to 40 mg Daily  ? ?*If you need a refill on your cardiac medications before your next appointment, please call your pharmacy* ? ? ?Lab Work: ?Your physician recommends that you return for lab work at next blood draw ( CBC)  ? ?If you have labs (blood work) drawn today and your tests are completely normal, you will receive your results only by: ?MyChart Message (if you have MyChart) OR ?A paper copy in the mail ?If you have any lab test that is abnormal or we need to change your treatment, we will call you to review the results. ? ? ?Testing/Procedures: ?NONE  ? ? ?Follow-Up: ?At [facility], you and your health needs are our priority.  As part of our continuing mission to provide you with exceptional heart care, we have created designated Provider Care Teams.  These Care Teams include your primary Cardiologist (physician) and Advanced Practice Providers (APPs -  Physician Assistants and Nurse Practitioners) who all work together to provide you with the care you need, when you need it. ? ?We recommend signing up for the patient portal called "MyChart".  Sign up information is provided on this After Visit Summary.  MyChart is used to connect with patients for Virtual Visits (Telemedicine).  Patients are able to view lab/test results, encounter notes, upcoming appointments, etc.  Non-urgent messages can be sent to your provider as well.   ?To learn more about what you can do with MyChart, go to [email or web address].   ? ?Your next appointment:   ?2-3 weeks  ? ?The format for your next appointment:   ?In Person ? ?Provider:   ?Rozann Lesches, MD  ? ? ?Other Instructions ? ?You have been given samples today of Eliquis 5 mg  Lot# [id number] Exp: 07/2023 # 28 Tablet.  ? ?Thank you for choosing [facility]! ? ? ? ?Important Information About Sugar ? ? ? ? ? ? ?

## 2021-11-06 LAB — CBC
Hematocrit: 42.2 % (ref 34.0–46.6)
Hemoglobin: 13.9 g/dL (ref 11.1–15.9)
MCH: 30.9 pg (ref 26.6–33.0)
MCHC: 32.9 g/dL (ref 31.5–35.7)
MCV: 94 fL (ref 79–97)
Platelets: 295 10*3/uL (ref 150–450)
RBC: 4.5 x10E6/uL (ref 3.77–5.28)
RDW: 13.9 % (ref 11.7–15.4)
WBC: 10.5 10*3/uL (ref 3.4–10.8)

## 2021-11-13 DIAGNOSIS — E1122 Type 2 diabetes mellitus with diabetic chronic kidney disease: Secondary | ICD-10-CM | POA: Diagnosis not present

## 2021-11-13 DIAGNOSIS — I129 Hypertensive chronic kidney disease with stage 1 through stage 4 chronic kidney disease, or unspecified chronic kidney disease: Secondary | ICD-10-CM | POA: Diagnosis not present

## 2021-11-13 DIAGNOSIS — R809 Proteinuria, unspecified: Secondary | ICD-10-CM | POA: Diagnosis not present

## 2021-11-13 DIAGNOSIS — E211 Secondary hyperparathyroidism, not elsewhere classified: Secondary | ICD-10-CM | POA: Diagnosis not present

## 2021-11-13 DIAGNOSIS — E559 Vitamin D deficiency, unspecified: Secondary | ICD-10-CM | POA: Diagnosis not present

## 2021-11-13 DIAGNOSIS — I5032 Chronic diastolic (congestive) heart failure: Secondary | ICD-10-CM | POA: Diagnosis not present

## 2021-11-13 DIAGNOSIS — N189 Chronic kidney disease, unspecified: Secondary | ICD-10-CM | POA: Diagnosis not present

## 2021-11-13 DIAGNOSIS — E1129 Type 2 diabetes mellitus with other diabetic kidney complication: Secondary | ICD-10-CM | POA: Diagnosis not present

## 2021-11-18 ENCOUNTER — Ambulatory Visit: Payer: HMO | Admitting: Cardiology

## 2021-11-21 NOTE — Progress Notes (Signed)
Cardiology Office Note    Date:  12/03/2021   ID:  Virginia Huber, DOB 10/16/1945, MRN [id number]   PCP:  Sharilyn Sites, [place]  Cardiologist:  Rozann Lesches, MD   Advanced Practice Provider:  No care team member to display Electrophysiologist:  Will Meredith Leeds, MD   [phone]   Chief Complaint  Patient presents with   Follow-up    History of Present Illness:  Virginia Huber is a 76 y.o. female with history CAD status post angioplasty of the OM1 in 2016, DES to the circumflex in 2016, and DES to the RCA in 2017.  Paroxysmal atrial fibrillation with very low rhythm burden based on device interrogation.  She remains on low-dose amiodarone along with Coreg, Cardizem CD, and Eliquis, SSS s/p medtronic pacemaker, HTN, CKD 3b, HFpEF 55-60% 2021.   Last saw Dr. Domenic Polite 07/28/21 and doing well.   I saw the patient 11/05/21 with dizziness after walking short distance. She was orthostatic in Dr. Cornelia Copa office but not ours-she hadn't taken meds yet. She also had lost 20 lbs. I reduced her diltiazem from 360 mg down to 180 mg and lasix 40 mg daily.   Patient comes in for f/u. Still extremely dizzy. BP still running low and renal stopped hydralazine. Some BP readings are in the 47'W systolic. yesterday she says her BP was low 85/50's and HR in 90's. She gets uncomfortable when HR goes up.She is due for pacer check in July.     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 h/o PAF/flutter during admission in 2013 for PNA. b. PAF during adm for NSTEMI 07/2015, subsequent paroxysms since then.   B12 deficiency anemia    Branch retinal vein occlusion  with macular edema of right eye 10/11/2019   Chronic renal failure, stage 3b ([place]) 09/23/2017   Coronary artery disease 11/30/2014   a. remote MI. b. h/o PTCA with scoring balloon to OM1 11/2014. c. NSTEMI 03/2015 s/p DES to prox-mid Cx. d. NSTEMI 07/2015 s/p scoring balloon/PTCA/DES to dRCA with PAF during that admission   Cutaneous lupus erythematosus    Early stage nonexudative age-related macular degeneration of right eye 02/27/2020   Essential hypertension    GERD (gastroesophageal reflux disease)    History of blood transfusion 1980's   2nd surgical procedures   Hypercholesteremia    Hypothyroidism    Myocardial infarction ([place]) 02/2012   NSTEMI (non-ST elevated myocardial infarction) ([place]) 04/02/2015   OSA (obstructive sleep apnea) 05/13/2016   Ovarian tumor    PAD (peripheral artery disease) ([place])    a. s/p LE angio 2015; followed by Dr. Fletcher Anon - managed medically.   Pericardial effusion    a. 06/2016 after ppm - s/p pericardiocentesis.   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 s/p Medtronic PPM 06/2016, c/b lead perf/pericardial effusion.   TIA (transient ischemic attack)    Type 2 diabetes with nephropathy (  [place]) 02/29/2012    Past Surgical History:  Procedure Laterality Date   ABDOMINAL AORTAGRAM N/A 01/03/2014   Procedure: ABDOMINAL AORTAGRAM;  Surgeon: Wellington Hampshire, MD;  Location: [facility] CATH LAB;  Service: Cardiovascular;  Laterality: N/A;   ABDOMINAL HYSTERECTOMY  06/22/1970   "partial"   APPENDECTOMY  02/20/1969   CARDIAC CATHETERIZATION  06/22/2006   Tiny OM-2 with 90% narrowing. Med tx.   CARDIAC CATHETERIZATION N/A 11/30/2014   Procedure: Left Heart Cath and Coronary Angiography;  Surgeon: Troy Sine, MD; LAD 20%, CFX 50%, OM1 95%, right PLB 30%, LV normal    CARDIAC CATHETERIZATION N/A 11/30/2014   Procedure: Coronary Balloon Angioplasty;  Surgeon: Troy Sine, MD;  Angiosculpt scoring balloon and PTCA to the OM1 reducing stenosis from 95% to less than 10%   CARDIAC CATHETERIZATION N/A 04/03/2015   Procedure: Left Heart Cath and Coronary Angiography;  Surgeon: Jolaine Artist, MD; dLAD 50%, CFX 90%, OM1 100%, PLA 15%, LVEDP 13     CARDIAC CATHETERIZATION N/A 04/03/2015   Procedure: Coronary Stent Intervention;  Surgeon: Sherren Mocha, MD; 3.0x18 mm Xience DES to the CFX     CARDIAC CATHETERIZATION N/A 08/02/2015   Procedure: Left Heart Cath and Coronary Angiography;  Surgeon: Troy Sine, MD;  Location: [facility] CV [facility];  Service: Cardiovascular;  Laterality: N/A;   CARDIAC CATHETERIZATION N/A 08/02/2015   Procedure: Coronary Stent Intervention;  Surgeon: Troy Sine, MD;  Location: [facility] LAB;  Service: Cardiovascular;  Laterality: N/A;   CARDIAC CATHETERIZATION N/A 06/25/2016   Procedure: Pericardiocentesis;  Surgeon: Will Meredith Leeds, MD;  Location: [facility] LAB;  Service: Cardiovascular;  Laterality: N/A;   cardiac stents     CARDIOVERSION N/A 12/15/2017   Procedure: CARDIOVERSION;  Surgeon: Acie Fredrickson Wonda Cheng, MD;  Location[facility] ENDOSCOPY;  Service: Cardiovascular;  Laterality: N/A;   CHOLECYSTECTOMY OPEN  02/20/1989   COLONOSCOPY  06/23/2003   Dr. Laural Golden: pancolonic divericula, polyp, path unknown currently   COLONOSCOPY  06/22/2010   Dr. Oneida Alar: Normal TI, scattered diverticula in entire colon, small internal hemorrhoids, normal colon biopsies. Colonoscopy in 5-10 years.    COLONOSCOPY WITH PROPOFOL N/A 06/02/2021   pancolonic diverticulosis. Two 4-6 mm polyps in transverse colon. Sessile serrated and hyperplastic. 5 year surveillance if benefits outweigh the risks.   COLOSTOMY  05/23/1979   COLOSTOMY REVERSAL  11/21/1979   EP IMPLANTABLE DEVICE  N/A 06/25/2016   Procedure: Lead Revision/Repair;  Surgeon: Will Meredith Leeds, MD;  Location: [facility] CV LAB;  Service: Cardiovascular;  Laterality: N/A;   EP IMPLANTABLE DEVICE N/A 06/25/2016   Procedure: Pacemaker Implant;  Surgeon: Will Meredith Leeds, MD;  Location: [facility] CV LAB;  Service: Cardiovascular;  Laterality: N/A;   EXCISIONAL HEMORRHOIDECTOMY  02/20/1969   EYE SURGERY Left 06/22/1998   "branch vein occlusion"   EYE SURGERY Left ~ 2001   "smoothed out wrinkle"   LEFT OOPHORECTOMY  05/23/1979   nicked bowel, peritonitis, colostomy; colostomy reversed 1981    LOWER EXTREMITY ANGIOGRAM N/A 01/03/2014   Procedure: LOWER EXTREMITY ANGIOGRAM;  Surgeon: Wellington Hampshire, MD;  Location: [facility] CATH LAB;  Service: Cardiovascular;  Laterality: N/A;   Nuclear med stress test  10/21/2011   Small area of mild ischemia inferoapically.   PARTIAL HYSTERECTOMY  02/20/1969   left ovaries, then ovaries removed later due tumors    POLYPECTOMY  06/02/2021   Procedure: POLYPECTOMY;  Surgeon: Eloise Harman, DO;  [facility];  Service: Endoscopy;;  right eye surgery  03/2021   RIGHT OOPHORECTOMY  02/20/1969    Current Medications: Current Meds  Medication Sig   acetaminophen (TYLENOL) 325 MG tablet Take 650 mg by mouth every 6 (six) hours as needed for headache.   amiodarone (PACERONE) 200 MG tablet Take 0.5 tablets (100 mg total) by mouth daily.   apixaban (ELIQUIS) 5 MG TABS tablet Take 1 tablet (5 mg total) by mouth 2 (two) times daily.   atorvastatin (LIPITOR) 80 MG tablet TAKE ONE TABLET BY MOUTH EVERY DAY AT 6:00PM   carvedilol (COREG) 25 MG tablet Take 12.5 mg by mouth 2 (two) times daily.   Cholecalciferol (VITAMIN D3 PO) Take 2,000 Units by mouth every evening.   clopidogrel (PLAVIX) 75 MG tablet TAKE ONE TABLET ('75MG'$  TOTAL) BY MOUTH DAILY   Continuous Blood Gluc Receiver (FREESTYLE LIBRE 2 READER) DEVI 1 each by Does not apply route daily.   Continuous Blood  Gluc Sensor (FREESTYLE LIBRE 2 SENSOR) MISC USE ONE FOR FOURTEEN DAYS   diltiazem (CARDIZEM CD) 180 MG 24 hr capsule Take 1 capsule (180 mg total) by mouth daily.   glucose blood (FREESTYLE PRECISION NEO TEST) test strip Use as instructed   insulin NPH-regular Human (NOVOLIN 70/30) (70-30) 100 UNIT/ML injection INJECT 25 UNITS BEFORE BREAKFAST AND 15 UNITS BEFORE SUPPER   levothyroxine (SYNTHROID) 112 MCG tablet TAKE ONE TABLET BY MOUTH ONCE DAILY *New DOSE   nitroGLYCERIN (NITROSTAT) 0.4 MG SL tablet Place 1 tablet (0.4 mg total) under the tongue every 5 (five) minutes as needed for chest pain.   rOPINIRole (REQUIP) 0.5 MG tablet Take 0.5 mg by mouth at bedtime.    Semaglutide, 2 MG/DOSE, 8 MG/3ML SOPN Inject 2 mg as directed once a week.   [DISCONTINUED] furosemide (LASIX) 40 MG tablet Take 1 tablet (40 mg total) by mouth daily.   [DISCONTINUED] olmesartan (BENICAR) 40 MG tablet Take 20 mg by mouth in the morning.   [DISCONTINUED] potassium chloride SA (KLOR-CON) 20 MEQ tablet TAKE ONE (1) TABLET BY MOUTH EVERY DAY     Allergies:   Penicillins and Percocet [oxycodone-acetaminophen]   Social History   Socioeconomic History   Marital status: Married    Spouse name: Not on file   Number of children: Not on file   Years of education: Not on file   Highest education level: Not on file  Occupational History   Occupation: Retired    Fish farm manager: RETIRED    Comment: insurance billing  Tobacco Use   Smoking status: Never    Passive exposure: Never   Smokeless tobacco: Never   Tobacco comments:    Never smoked  Vaping Use   Vaping Use: Never used  Substance and Sexual Activity   Alcohol use: No    Alcohol/week: 0.0 standard drinks of alcohol   Drug use: No   Sexual activity: Never    Birth control/protection: Surgical    Comment: hyst  Other Topics Concern   Not on file  Social History Narrative   Not on file   Social Determinants of Health   Financial Resource Strain: Not on  file  Food Insecurity: Not on file  Transportation Needs: Not on file  Physical Activity: Not on file  Stress: Not on file  Social Connections: Not on file     Family History:  The patient's  family history includes Diabetes in her brother; Heart disease in her brother, brother, father, mother, and sister; Lupus in her daughter; Thyroid disease in her brother and  brother.   ROS:   Please see the history of present illness.    ROS All other systems reviewed and are negative.   PHYSICAL EXAM:   VS:  BP 140/60   Pulse 67   Ht '5\' 3"'$  (1.6 m)   Wt 142 lb (64.4 kg)   SpO2 98%   BMI 25.15 kg/m   Physical Exam  GEN: Well nourished, well developed, in no acute distress  Neck: no JVD, carotid bruits, or masses Cardiac:RRR; no murmurs, rubs, or gallops  Respiratory:  clear to auscultation bilaterally, normal work of breathing GI: soft, nontender, nondistended, + BS Ext: without cyanosis, clubbing, or edema, Good distal pulses bilaterally Neuro:  Alert and Oriented x 3,  Psych: euthymic mood, full affect  Wt Readings from Last 3 Encounters:  12/03/21 142 lb (64.4 kg)  11/05/21 142 lb 6.4 oz (64.6 kg)  10/29/21 143 lb 3.2 oz (65 kg)      Studies/Labs Reviewed:   EKG:  EKG is not ordered today.    Recent Labs: 05/29/2021: BUN 25; Creatinine, Ser 1.58; Potassium 4.0; Sodium 140 08/25/2021: ALT 21 10/09/2021: TSH 1.18 11/05/2021: Hemoglobin 13.9; Platelets 295   Lipid Panel    Component Value Date/Time   CHOL 166 08/05/2017 0807   TRIG 160 (H) 08/05/2017 0807   HDL 43 (L) 08/05/2017 0807   CHOLHDL 3.9 08/05/2017 0807   VLDL NOT CALC 04/14/2016 0910   LDLCALC 96 08/05/2017 0807    Additional studies/ records that were reviewed today include:   TTE 08/04/17 Review of the above records today demonstrates:  - Left ventricle: The cavity size was normal. Wall thickness was   increased in a pattern of moderate LVH. Systolic function was   normal. The estimated ejection fraction  was in the range of 55%   to 60%. Wall motion was normal; there were no regional wall   motion abnormalities. Features are consistent with a pseudonormal   left ventricular filling pattern, with concomitant abnormal   relaxation and increased filling pressure (grade 2 diastolic   dysfunction). Doppler parameters are consistent with high   ventricular filling pressure. - Left atrium: The atrium was severely dilated. - Right ventricle: Pacer wire or catheter noted in right ventricle. - Right atrium: Pacer wire or catheter noted in right atrium.   Cath  08/02/15 1st RPLB lesion, 50% stenosed. Dist RCA lesion, 30% stenosed. Ost 1st Mrg to 1st Mrg lesion, 100% stenosed. Ost LAD lesion, 40% stenosed. Mid LAD lesion, 40% stenosed. Post Atrio lesion, 90% stenosed. Post intervention, there is a 0% residual stenosis. The left ventricular systolic function is normal. Mid RCA lesion, 20% stenosed.   Normal LV function without focal segmental wall motion abnormalities and ejection fraction of 55%.   Successful percutaneous cardiac intervention to the distal RCA treated with Angiosculpt scoring balloon, PTCA, and ultimate stenting with a 2.58 mm Xience Alpine DES stent postdilated to 2.51 mm with a 90% stenosis reduced to 0% and no change in the ostial PDA narrowing.   Risk Assessment/Calculations:    CHA2DS2-VASc Score = 9   This indicates a 12.2% annual risk of stroke. The patient's score is based upon: CHF History: 1 HTN History: 1 Diabetes History: 1 Stroke History: 2 (retinal vein occlusion) Vascular Disease History: 1 Age Score: 2 Gender Score: 1        ASSESSMENT:    No diagnosis found.   PLAN:  In order of problems listed above:  Orthostatic Dizziness after walking short distance.  diltiazem reduced from 360 mg down to 180 mg daily and reduce lasix 40 mg once daily lov. Hydralazine stopped by renal. BP as low as 75 and still very dizzy. HR in 90's at times. Will stop  lasix, K, and olmesartan. F/u next week. Labs have been stable   CAD status post STEMI 2016 PCI to the OM1. DES to the circumflex 2016, non-STEMI in 2017 with DES to the RCA.  No angina   PAF on eliquis and amiodarone, coreg and diltiazem followed by Dr. Curt Bears. No fast arrhythmias on device checks. I reduced diltiazem and HR's in 90's at times. She is due for device check. No bleeding problems.   SSS S/P Pacemaker followed by Dr. Curt Bears   HTN now with hypotension   OSA on CPAP  Shared Decision Making/Informed Consent        Medication Adjustments/Labs and Tests Ordered: Current medicines are reviewed at length with the patient today.  Concerns regarding medicines are outlined above.  Medication changes, Labs and Tests ordered today are listed in the Patient Instructions below. There are no Patient Instructions on file for this visit.   Sumner Boast, PA-C  12/03/2021 10:09 AM    [facility] [address], [place], [place]  [address] Phone: [phone]; Fax: [phone]

## 2021-12-02 DIAGNOSIS — Z85828 Personal history of other malignant neoplasm of skin: Secondary | ICD-10-CM | POA: Diagnosis not present

## 2021-12-02 DIAGNOSIS — Z08 Encounter for follow-up examination after completed treatment for malignant neoplasm: Secondary | ICD-10-CM | POA: Diagnosis not present

## 2021-12-03 ENCOUNTER — Encounter: Payer: Self-pay | Admitting: Cardiology

## 2021-12-03 ENCOUNTER — Ambulatory Visit (INDEPENDENT_AMBULATORY_CARE_PROVIDER_SITE_OTHER): Payer: HMO | Admitting: Physician Assistant

## 2021-12-03 ENCOUNTER — Encounter: Payer: Self-pay | Admitting: Physician Assistant

## 2021-12-03 VITALS — BP 140/60 | HR 67 | Ht 63.0 in | Wt 142.0 lb

## 2021-12-03 DIAGNOSIS — I951 Orthostatic hypotension: Secondary | ICD-10-CM | POA: Diagnosis not present

## 2021-12-03 DIAGNOSIS — I2510 Atherosclerotic heart disease of native coronary artery without angina pectoris: Secondary | ICD-10-CM | POA: Diagnosis not present

## 2021-12-03 DIAGNOSIS — Z9989 Dependence on other enabling machines and devices: Secondary | ICD-10-CM

## 2021-12-03 DIAGNOSIS — I10 Essential (primary) hypertension: Secondary | ICD-10-CM | POA: Diagnosis not present

## 2021-12-03 DIAGNOSIS — I495 Sick sinus syndrome: Secondary | ICD-10-CM

## 2021-12-03 DIAGNOSIS — G4733 Obstructive sleep apnea (adult) (pediatric): Secondary | ICD-10-CM

## 2021-12-03 DIAGNOSIS — I480 Paroxysmal atrial fibrillation: Secondary | ICD-10-CM

## 2021-12-03 NOTE — Patient Instructions (Signed)
Medication Instructions:  Your physician has recommended you make the following change in your medication:     DISCONTINUE: Olmesartan, Potassium, Lasix  *If you need a refill on your cardiac medications before your next appointment, please call your pharmacy*   Lab Work: None If you have labs (blood work) drawn today and your tests are completely normal, you will receive your results only by: [facility] (if you have MyChart) OR A paper copy in the mail If you have any lab test that is abnormal or we need to change your treatment, we will call you to review the results.   Follow-Up: At [facility], you and your health needs are our priority.  As part of our continuing mission to provide you with exceptional heart care, we have created designated Provider Care Teams.  These Care Teams include your primary Cardiologist (physician) and Advanced Practice Providers (APPs -  Physician Assistants and Nurse Practitioners) who all work together to provide you with the care you need, when you need it.   Your next appointment:   As scheduled

## 2021-12-03 NOTE — Progress Notes (Signed)
Cardiology Office Note    Date:  12/09/2021   ID:  Virginia Huber, Virginia Huber January 16, 1946, MRN [id number]   PCP:  Sharilyn Sites, [place]  Cardiologist:  Rozann Lesches, MD   Advanced Practice Provider:  No care team member to display Electrophysiologist:  Will Meredith Leeds, MD   [phone]   No chief complaint on file.   History of Present Illness:  Virginia Huber is a 76 y.o. female with history CAD status post angioplasty of the OM1 in 2016, DES to the circumflex in 2016, and DES to the RCA in 2017.  Paroxysmal atrial fibrillation with very low rhythm burden based on device interrogation.  She remains on low-dose amiodarone along with Coreg, Cardizem CD, and Eliquis, SSS s/p medtronic pacemaker, HTN, CKD 3b, HFpEF 55-60% 2021.   Last saw Dr. Domenic Polite 07/28/21 and doing well.   I saw the patient 11/05/21 with dizziness after walking short distance. She was orthostatic in Dr. Cornelia Copa office but not ours-she hadn't taken meds yet. She also had lost 20 lbs. I reduced her diltiazem from 360 mg down to 180 mg and lasix 40 mg daily.   I saw her back 12/03/21 and she was still extremely dizzy. BP still running low and renal stopped hydralazine. Some BP readings are in the 60'V systolic. yesterday she says her BP was low 85/50's and HR in 90's. She gets uncomfortable when HR goes up.She is due for pacer check in July. I stopped lasix, potassium and olmesartan.  Patient says overall dizziness is better but still having some. When cleaning bathroom BP was 95/70's P 98 and she was dizzy. Not able to exercise because of leg pain do to PAD pain-last dopplers 2015-saw Dr. Fletcher Anon at that time and couldn't do anything b/c of distal disease. Crt 1.79 11/05/21.     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 h/o PAF/flutter during admission in 2013 for PNA. b. PAF during adm for NSTEMI 07/2015, subsequent paroxysms since then.   B12 deficiency anemia    Branch retinal vein occlusion with macular edema of right eye 10/11/2019   Chronic renal failure, stage 3b ([place]) 09/23/2017   Coronary artery disease 11/30/2014   a. remote MI. b. h/o PTCA with scoring balloon to OM1 11/2014. c. NSTEMI 03/2015 s/p DES to prox-mid Cx. d. NSTEMI 07/2015 s/p scoring balloon/PTCA/DES to dRCA with PAF during that admission   Cutaneous lupus erythematosus    Early stage nonexudative age-related macular degeneration of right eye 02/27/2020   Essential hypertension    GERD (gastroesophageal reflux disease)    History of blood transfusion 1980's   2nd surgical procedures   Hypercholesteremia    Hypothyroidism    Myocardial infarction ([place]) 02/2012   NSTEMI (non-ST elevated myocardial infarction) ([place]) 04/02/2015   OSA (obstructive sleep apnea) 05/13/2016   Ovarian tumor    PAD (peripheral artery disease) ([place])    a. s/p LE angio 2015; followed by Dr. Fletcher Anon - managed medically.   Pericardial effusion    a. 06/2016 after ppm - s/p pericardiocentesis.   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 s/p Medtronic PPM 06/2016, c/b lead perf/pericardial effusion.   TIA (transient ischemic attack)    Type 2 diabetes with nephropathy ([place]) 02/29/2012    Past Surgical History:  Procedure Laterality Date   ABDOMINAL AORTAGRAM N/A 01/03/2014   Procedure: ABDOMINAL [name];   Surgeon: Wellington Hampshire, MD;  Location: [facility] CATH LAB;  Service: Cardiovascular;  Laterality: N/A;   ABDOMINAL HYSTERECTOMY  06/22/1970   "partial"   APPENDECTOMY  02/20/1969   CARDIAC CATHETERIZATION  06/22/2006   Tiny OM-2 with 90% narrowing. Med tx.   CARDIAC CATHETERIZATION N/A 11/30/2014   Procedure: Left Heart Cath and Coronary Angiography;  Surgeon: Troy Sine, MD; LAD 20%, CFX 50%, OM1 95%, right PLB 30%, LV normal    CARDIAC CATHETERIZATION N/A 11/30/2014   Procedure: Coronary Balloon Angioplasty;  Surgeon: Troy Sine, MD;  Angiosculpt scoring balloon and PTCA to the OM1 reducing stenosis from 95% to less than 10%   CARDIAC CATHETERIZATION N/A 04/03/2015   Procedure: Left Heart Cath and Coronary Angiography;  Surgeon: Jolaine Artist, MD; dLAD 50%, CFX 90%, OM1 100%, PLA 15%, LVEDP 13     CARDIAC CATHETERIZATION N/A 04/03/2015   Procedure: Coronary Stent Intervention;  Surgeon: Sherren Mocha, MD; 3.0x18 mm Xience DES to the CFX     CARDIAC CATHETERIZATION N/A 08/02/2015   Procedure: Left Heart Cath and Coronary Angiography;  Surgeon: Troy Sine, MD;  Location: [facility] CV LAB;  Service: Cardiovascular;  Laterality: N/A;   CARDIAC CATHETERIZATION N/A 08/02/2015   Procedure: Coronary Stent Intervention;  Surgeon: Troy Sine, MD;  Location: [facility] CV LAB;  Service: Cardiovascular;  Laterality: N/A;   CARDIAC CATHETERIZATION N/A 06/25/2016   Procedure: Pericardiocentesis;  Surgeon: Will Meredith Leeds, MD;  Location: [facility] CV LAB;  Service: Cardiovascular;  Laterality: N/A;   cardiac stents     CARDIOVERSION N/A 12/15/2017   Procedure: CARDIOVERSION;  Surgeon: Acie Fredrickson Wonda Cheng, MD;  Location: [facility] ENDOSCOPY;  Service: Cardiovascular;  Laterality: N/A;   CHOLECYSTECTOMY OPEN  02/20/1989   COLONOSCOPY  06/23/2003   Dr. Laural Golden: pancolonic divericula, polyp, path unknown currently   COLONOSCOPY  06/22/2010   Dr. Oneida Alar: Normal TI, scattered diverticula in  entire colon, small internal hemorrhoids, normal colon biopsies. Colonoscopy in 5-10 years.    COLONOSCOPY WITH PROPOFOL N/A 06/02/2021   pancolonic diverticulosis. Two 4-6 mm polyps in transverse colon. Sessile serrated and hyperplastic. 5 year surveillance if benefits outweigh the risks.   COLOSTOMY  05/23/1979   COLOSTOMY REVERSAL  11/21/1979   EP IMPLANTABLE DEVICE N/A 06/25/2016   Procedure: Lead Revision/Repair;  Surgeon: Will Meredith Leeds, MD;  Location: [facility] CV LAB;  Service: Cardiovascular;  Laterality: N/A;   EP IMPLANTABLE DEVICE N/A 06/25/2016   Procedure: Pacemaker Implant;  Surgeon: Will Meredith Leeds, MD;  Location: [facility] CV LAB;  Service: Cardiovascular;  Laterality: N/A;   EXCISIONAL HEMORRHOIDECTOMY  02/20/1969   EYE SURGERY Left 06/22/1998   "branch vein occlusion"   EYE SURGERY Left ~ 2001   "smoothed out wrinkle"   LEFT OOPHORECTOMY  05/23/1979   nicked bowel, peritonitis, colostomy; colostomy reversed 1981    LOWER EXTREMITY ANGIOGRAM N/A 01/03/2014   Procedure: LOWER EXTREMITY ANGIOGRAM;  Surgeon: Wellington Hampshire, MD;  Location: [facility]  CATH LAB;  Service: Cardiovascular;  Laterality: N/A;   Nuclear med stress test  10/21/2011   Small area of mild ischemia inferoapically.   PARTIAL HYSTERECTOMY  02/20/1969   left ovaries, then ovaries removed later due tumors    POLYPECTOMY  06/02/2021   Procedure: POLYPECTOMY;  Surgeon: Eloise Harman, DO;  [facility];  Service: Endoscopy;;   right eye surgery  03/2021   RIGHT OOPHORECTOMY  02/20/1969    Current Medications: Current Meds  Medication Sig   acetaminophen (TYLENOL) 325 MG tablet Take 650 mg by mouth every 6 (six) hours as needed for headache.   amiodarone (PACERONE) 200 MG tablet Take 0.5 tablets (100 mg total) by mouth daily.   apixaban (ELIQUIS) 5 MG TABS tablet Take 1 tablet (5 mg total) by mouth 2 (two) times daily.   atorvastatin (LIPITOR) 80 MG tablet TAKE ONE TABLET BY MOUTH  EVERY DAY AT 6:00PM   carvedilol (COREG) 25 MG tablet Take 12.5 mg by mouth 2 (two) times daily.   Cholecalciferol (VITAMIN D3 PO) Take 2,000 Units by mouth every evening.   clopidogrel (PLAVIX) 75 MG tablet TAKE ONE TABLET ('75MG'$  TOTAL) BY MOUTH DAILY   Continuous Blood Gluc Receiver (FREESTYLE LIBRE 2 READER) DEVI 1 each by Does not apply route daily.   Continuous Blood Gluc Sensor (FREESTYLE LIBRE 2 SENSOR) MISC USE ONE FOR FOURTEEN DAYS   glucose blood (FREESTYLE PRECISION NEO TEST) test strip Use as instructed   insulin NPH-regular Human (NOVOLIN 70/30) (70-30) 100 UNIT/ML injection INJECT 25 UNITS BEFORE BREAKFAST AND 15 UNITS BEFORE SUPPER   levothyroxine (SYNTHROID) 112 MCG tablet TAKE ONE TABLET BY MOUTH ONCE DAILY *New DOSE   nitroGLYCERIN (NITROSTAT) 0.4 MG SL tablet Place 1 tablet (0.4 mg total) under the tongue every 5 (five) minutes as needed for chest pain.   rOPINIRole (REQUIP) 0.5 MG tablet Take 0.5 mg by mouth at bedtime.    Semaglutide, 2 MG/DOSE, 8 MG/3ML SOPN Inject 2 mg as directed once a week.   [DISCONTINUED] diltiazem (CARDIZEM CD) 180 MG 24 hr capsule Take 1 capsule (180 mg total) by mouth daily.     Allergies:   Penicillins and Percocet [oxycodone-acetaminophen]   Social History   Socioeconomic History   Marital status: Married    Spouse name: Not on file   Number of children: Not on file   Years of education: Not on file   Highest education level: Not on file  Occupational History   Occupation: Retired    Fish farm manager: RETIRED    Comment: insurance billing  Tobacco Use   Smoking status: Never    Passive exposure: Never   Smokeless tobacco: Never   Tobacco comments:    Never smoked  Vaping Use   Vaping Use: Never used  Substance and Sexual Activity   Alcohol use: No    Alcohol/week: 0.0 standard drinks of alcohol   Drug use: No   Sexual activity: Never    Birth control/protection: Surgical    Comment: hyst  Other Topics Concern   Not on file  Social  History Narrative   Not on file   Social Determinants of Health   Financial Resource Strain: Not on file  Food Insecurity: Not on file  Transportation Needs: Not on file  Physical Activity: Not on file  Stress: Not on file  Social Connections: Not on file     Family History:  The patient's  family history includes Diabetes in her brother; Heart disease in her  brother, brother, father, mother, and sister; Lupus in her daughter; Thyroid disease in her brother and brother.   ROS:   Please see the history of present illness.    ROS All other systems reviewed and are negative.   PHYSICAL EXAM:   VS:  BP (!) 142/80   Pulse 78   Ht '5\' 3"'$  (1.6 m)   Wt 141 lb 6.4 oz (64.1 kg)   SpO2 97%   BMI 25.05 kg/m   Physical Exam  GEN: Well nourished, well developed, in no acute distress  Neck: no JVD, carotid bruits, or masses Cardiac:RRR; no murmurs, rubs, or gallops  Respiratory:  clear to auscultation bilaterally, normal work of breathing GI: soft, nontender, nondistended, + BS Ext: without cyanosis, clubbing, or edema, Good distal pulses bilaterally Neuro:  Alert and Oriented x 3 Psych: euthymic mood, full affect  Wt Readings from Last 3 Encounters:  12/09/21 141 lb 6.4 oz (64.1 kg)  12/03/21 142 lb (64.4 kg)  11/05/21 142 lb 6.4 oz (64.6 kg)      Studies/Labs Reviewed:   EKG:  EKG is notordered to day.    Recent Labs: 05/29/2021: BUN 25; Creatinine, Ser 1.58; Potassium 4.0; Sodium 140 08/25/2021: ALT 21 10/09/2021: TSH 1.18 11/05/2021: Hemoglobin 13.9; Platelets 295   Lipid Panel    Component Value Date/Time   CHOL 166 08/05/2017 0807   TRIG 160 (H) 08/05/2017 0807   HDL 43 (L) 08/05/2017 0807   CHOLHDL 3.9 08/05/2017 0807   VLDL NOT CALC 04/14/2016 0910   LDLCALC 96 08/05/2017 0807    Additional studies/ records that were reviewed today include:    angiography in 12/2013 which showed:   1. No significant aortoiliac disease.  2. Severe focal left popliteal artery  stenosis with occluded tibioperoneal Vessels. The anterior tibial is occluded proximally with reconstitution. This represents the only patent vessel to the foot.  3. Two-vessel runoff on the right side with diffuse disease affecting the right anterior tibial and peroneal arteries.    She improved significantly with cilostazol and a walking program. Her symptoms currently not lifestyle limiting. She has been suffering from left hip pain which is thought to be due to bursitis over the last few months. This has limited her physical activities.  TTE 08/04/17 Review of the above records today demonstrates:  - Left ventricle: The cavity size was normal. Wall thickness was   increased in a pattern of moderate LVH. Systolic function was   normal. The estimated ejection fraction was in the range of 55%   to 60%. Wall motion was normal; there were no regional wall   motion abnormalities. Features are consistent with a pseudonormal   left ventricular filling pattern, with concomitant abnormal   relaxation and increased filling pressure (grade 2 diastolic   dysfunction). Doppler parameters are consistent with high   ventricular filling pressure. - Left atrium: The atrium was severely dilated. - Right ventricle: Pacer wire or catheter noted in right ventricle. - Right atrium: Pacer wire or catheter noted in right atrium.   Cath  08/02/15 1st RPLB lesion, 50% stenosed. Dist RCA lesion, 30% stenosed. Ost 1st Mrg to 1st Mrg lesion, 100% stenosed. Ost LAD lesion, 40% stenosed. Mid LAD lesion, 40% stenosed. Post Atrio lesion, 90% stenosed. Post intervention, there is a 0% residual stenosis. The left ventricular systolic function is normal. Mid RCA lesion, 20% stenosed.   Normal LV function without focal segmental wall motion abnormalities and ejection fraction of 55%.   Successful percutaneous cardiac  intervention to the distal RCA treated with Angiosculpt scoring balloon, PTCA, and ultimate stenting with  a 2.58 mm Xience Alpine DES stent postdilated to 2.51 mm with a 90% stenosis reduced to 0% and no change in the ostial PDA narrowing.  Risk Assessment/Calculations:    CHA2DS2-VASc Score = 9   This indicates a 12.2% annual risk of stroke. The patient's score is based upon: CHF History: 1 HTN History: 1 Diabetes History: 1 Stroke History: 2 (retinal vein occlusion) Vascular Disease History: 1 Age Score: 2 Gender Score: 1        ASSESSMENT:    1. Orthostatic hypotension   2. CAD in native artery   3. Paroxysmal atrial fibrillation (HCC)   4. Tachy-brady syndrome ([place])   5. Essential hypertension   6. OSA on CPAP      PLAN:  In order of problems listed above: Orthostatic Dizziness after walking short distance.  diltiazem reduced from 360 mg down to 180 mg daily and  now off lasix, olmesartan, Hydralazine stopped by renal. BP as low as 98 and dizziness has improved. HR in 90's at times-no palpitations, just noticed on BP check.   Labs have been stable-Crt 1.79 11/05/21. Will stop diltiazem completely and continue low dose coreg. She sent a remote pacer check in last week that isn't recorded.   CAD status post STEMI 2016 PCI to the OM1. DES to the circumflex 2016, non-STEMI in 2017 with DES to the RCA.  No angina   PAF on eliquis and amiodarone, coreg and diltiazem followed by Dr. Curt Bears. No fast arrhythmias on device checks. I reduced diltiazem and HR's in 90's at times. She is due for device check which was sent in last week. No bleeding problems.   SSS S/P Pacemaker followed by Dr. Curt Bears   HTN now with hypotension-stop diltiazem   OSA on CPAP    Shared Decision Making/Informed Consent        Medication Adjustments/Labs and Tests Ordered: Current medicines are reviewed at length with the patient today.  Concerns regarding medicines are outlined above.  Medication changes, Labs and Tests ordered today are listed in the Patient Instructions below. Patient  Instructions  Medication Instructions:  Your physician has recommended you make the following change in your medication:  Stop Diltiazem   Labwork: None  Testing/Procedures: None  Follow-Up: Follow up with Dr. Curt Bears in September. Follow up with Ermalinda Barrios, PA-C in July/August Keep scheduled follow up with Dr. Domenic Polite.   Any Other Special Instructions Will Be Listed Below (If Applicable).     If you need a refill on your cardiac medications before your next appointment, please call your pharmacy.    Sumner Boast, PA-C  12/09/2021 8:08 AM    [facility] [address], [place], [place]  [address] Phone: [phone]; Fax: [phone]

## 2021-12-08 ENCOUNTER — Encounter: Payer: Self-pay | Admitting: Cardiology

## 2021-12-08 ENCOUNTER — Encounter (INDEPENDENT_AMBULATORY_CARE_PROVIDER_SITE_OTHER): Payer: Self-pay | Admitting: Ophthalmology

## 2021-12-08 ENCOUNTER — Ambulatory Visit (INDEPENDENT_AMBULATORY_CARE_PROVIDER_SITE_OTHER): Payer: HMO | Admitting: Ophthalmology

## 2021-12-08 DIAGNOSIS — H348322 Tributary (branch) retinal vein occlusion, left eye, stable: Secondary | ICD-10-CM | POA: Insufficient documentation

## 2021-12-08 DIAGNOSIS — H348122 Central retinal vein occlusion, left eye, stable: Secondary | ICD-10-CM | POA: Diagnosis not present

## 2021-12-08 DIAGNOSIS — H34831 Tributary (branch) retinal vein occlusion, right eye, with macular edema: Secondary | ICD-10-CM | POA: Diagnosis not present

## 2021-12-08 DIAGNOSIS — H353124 Nonexudative age-related macular degeneration, left eye, advanced atrophic with subfoveal involvement: Secondary | ICD-10-CM | POA: Diagnosis not present

## 2021-12-08 DIAGNOSIS — Z9889 Other specified postprocedural states: Secondary | ICD-10-CM | POA: Diagnosis not present

## 2021-12-08 MED ORDER — AFLIBERCEPT 2MG/0.05ML IZ SOLN FOR KALEIDOSCOPE
2.0000 mg | INTRAVITREAL | Status: AC | PRN
  Administered 2021-12-08: 2 mg via INTRAVITREAL

## 2021-12-08 NOTE — Assessment & Plan Note (Signed)
No active CMS, diffuse atrophy remains

## 2021-12-08 NOTE — Assessment & Plan Note (Signed)
Stable OS accounts for acuity no CNVM

## 2021-12-08 NOTE — Assessment & Plan Note (Signed)
Doing well no new floaters, vision improved

## 2021-12-08 NOTE — Assessment & Plan Note (Signed)
Today at 6-week follow-up, recurrence of CME minor, stable acuity.  Patient also undergoing current blood pressure changes and at times poor control due to a decrease in recent medications

## 2021-12-08 NOTE — Progress Notes (Signed)
12/08/2021     CHIEF COMPLAINT Patient presents for  Chief Complaint  Patient presents with   Branch Retinal Vein Occlusion      HISTORY OF PRESENT ILLNESS: Virginia Huber is a 76 y.o. female who presents to the clinic today for:   HPI   6 weeks for DILATE OU, EYLEA, OCT, OD. Pt stated vision has been stable since last visit. Pt denies new floaters and FOL.  Last edited by Silvestre Moment on 12/08/2021  9:12 AM.      Referring physician: Sharilyn Sites, MD [address] [place],  [place] [address]  HISTORICAL INFORMATION:   Selected notes from the MEDICAL RECORD NUMBER    Lab Results  Component Value Date   HGBA1C 6.7 (A) 10/09/2021     CURRENT MEDICATIONS: No current outpatient medications on file. (Ophthalmic Drugs)   No current facility-administered medications for this visit. (Ophthalmic Drugs)   Current Outpatient Medications (Other)  Medication Sig   acetaminophen (TYLENOL) 325 MG tablet Take 650 mg by mouth every 6 (six) hours as needed for headache.   amiodarone (PACERONE) 200 MG tablet Take 0.5 tablets (100 mg total) by mouth daily.   apixaban (ELIQUIS) 5 MG TABS tablet Take 1 tablet (5 mg total) by mouth 2 (two) times daily.   atorvastatin (LIPITOR) 80 MG tablet TAKE ONE TABLET BY MOUTH EVERY DAY AT 6:00PM   carvedilol (COREG) 25 MG tablet Take 12.5 mg by mouth 2 (two) times daily.   Cholecalciferol (VITAMIN D3 PO) Take 2,000 Units by mouth every evening.   clopidogrel (PLAVIX) 75 MG tablet TAKE ONE TABLET ('75MG'$  TOTAL) BY MOUTH DAILY   Continuous Blood Gluc Receiver (FREESTYLE LIBRE 2 READER) DEVI 1 each by Does not apply route daily.   Continuous Blood Gluc Sensor (FREESTYLE LIBRE 2 SENSOR) MISC USE ONE FOR FOURTEEN DAYS   diltiazem (CARDIZEM CD) 180 MG 24 hr capsule Take 1 capsule (180 mg total) by mouth daily.   glucose blood (FREESTYLE PRECISION NEO TEST) test strip Use as instructed   insulin NPH-regular Human (NOVOLIN 70/30) (70-30) 100 UNIT/ML  injection INJECT 25 UNITS BEFORE BREAKFAST AND 15 UNITS BEFORE SUPPER   levothyroxine (SYNTHROID) 112 MCG tablet TAKE ONE TABLET BY MOUTH ONCE DAILY *New DOSE   nitroGLYCERIN (NITROSTAT) 0.4 MG SL tablet Place 1 tablet (0.4 mg total) under the tongue every 5 (five) minutes as needed for chest pain.   rOPINIRole (REQUIP) 0.5 MG tablet Take 0.5 mg by mouth at bedtime.    Semaglutide, 2 MG/DOSE, 8 MG/3ML SOPN Inject 2 mg as directed once a week.   No current facility-administered medications for this visit. (Other)      REVIEW OF SYSTEMS: ROS   Negative for: Constitutional, Gastrointestinal, Neurological, Skin, Genitourinary, Musculoskeletal, HENT, Endocrine, Cardiovascular, Eyes, Respiratory, Psychiatric, Allergic/Imm, Heme/Lymph Last edited by Silvestre Moment on 12/08/2021  9:12 AM.       ALLERGIES Allergies  Allergen Reactions   Penicillins Hives    Has patient had a PCN reaction causing immediate rash, facial/tongue/throat swelling, SOB or lightheadedness with hypotension: Yes Has patient had a PCN reaction causing severe rash involving mucus membranes or skin necrosis: No Has patient had a PCN reaction that required hospitalization No Has patient had a PCN reaction occurring within the last 10 years: No If all of the above answers are "NO", then may proceed with Cephalosporin use.   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 h/o PAF/flutter during admission in 2013 for PNA. b. PAF during adm for NSTEMI 07/2015, subsequent paroxysms since then.   B12 deficiency anemia    Branch retinal vein occlusion with  macular edema of right eye 10/11/2019   Chronic renal failure, stage 3b ([place]) 09/23/2017   Coronary artery disease 11/30/2014   a. remote MI. b. h/o PTCA with scoring balloon to OM1 11/2014. c. NSTEMI 03/2015 s/p DES to prox-mid Cx. d. NSTEMI 07/2015 s/p scoring balloon/PTCA/DES to dRCA with PAF during that admission   Cutaneous lupus erythematosus    Early stage nonexudative age-related macular degeneration of right eye 02/27/2020   Essential hypertension    GERD (gastroesophageal reflux disease)    History of blood transfusion 1980's   2nd surgical procedures   Hypercholesteremia    Hypothyroidism    Myocardial infarction ([place]) 02/2012   NSTEMI (non-ST elevated myocardial infarction) ([place]) 04/02/2015   OSA (obstructive sleep apnea) 05/13/2016   Ovarian tumor    PAD (peripheral artery disease) ([place])    a. s/p LE angio 2015; followed by Dr. Fletcher Anon - managed medically.   Pericardial effusion    a. 06/2016 after ppm - s/p pericardiocentesis.   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 s/p Medtronic PPM 06/2016, c/b lead perf/pericardial effusion.   TIA (transient ischemic attack)    Type 2 diabetes with nephropathy ([place]) 02/29/2012   Past Surgical History:  Procedure Laterality Date   ABDOMINAL AORTAGRAM N/A 01/03/2014   Procedure: ABDOMINAL [name];  Surgeon: Wellington Hampshire, MD;  Location: [facility] CATH LAB;  Service: Cardiovascular;  Laterality: N/A;   ABDOMINAL HYSTERECTOMY  06/22/1970   "partial"   APPENDECTOMY  02/20/1969   CARDIAC CATHETERIZATION  06/22/2006   Tiny OM-2 with 90% narrowing. Med tx.   CARDIAC CATHETERIZATION N/A 11/30/2014   Procedure: Left Heart  Cath and Coronary Angiography;  Surgeon: Troy Sine, MD; LAD 20%, CFX 50%, OM1 95%, right PLB 30%, LV normal    CARDIAC CATHETERIZATION N/A 11/30/2014   Procedure: Coronary Balloon Angioplasty;  Surgeon: Troy Sine, MD;  Angiosculpt scoring balloon and PTCA to the OM1 reducing stenosis from 95% to less than 10%   CARDIAC CATHETERIZATION N/A 04/03/2015   Procedure: Left Heart Cath and Coronary Angiography;  Surgeon: Jolaine Artist, MD; dLAD 50%, CFX 90%, OM1 100%, PLA 15%, LVEDP 13     CARDIAC CATHETERIZATION N/A 04/03/2015   Procedure: Coronary Stent Intervention;  Surgeon: Sherren Mocha, MD; 3.0x18 mm Xience DES to the CFX     CARDIAC CATHETERIZATION N/A 08/02/2015   Procedure: Left Heart Cath and Coronary Angiography;  Surgeon: Troy Sine, MD;  Location: [facility] CV LAB;  Service: Cardiovascular;  Laterality: N/A;   CARDIAC CATHETERIZATION N/A 08/02/2015   Procedure: Coronary Stent Intervention;  Surgeon: Troy Sine, MD;  Location: [facility] CV LAB;  Service: Cardiovascular;  Laterality: N/A;   CARDIAC CATHETERIZATION  N/A 06/25/2016   Procedure: Pericardiocentesis;  Surgeon: Will Meredith Leeds, MD;  Location: [facility] CV LAB;  Service: Cardiovascular;  Laterality: N/A;   cardiac stents     CARDIOVERSION N/A 12/15/2017   Procedure: CARDIOVERSION;  Surgeon: Acie Fredrickson Wonda Cheng, MD;  Location: [facility] ENDOSCOPY;  Service: Cardiovascular;  Laterality: N/A;   CHOLECYSTECTOMY OPEN  02/20/1989   COLONOSCOPY  06/23/2003   Dr. Laural Golden: pancolonic divericula, polyp, path unknown currently   COLONOSCOPY  06/22/2010   Dr. Oneida Alar: Normal TI, scattered diverticula in entire colon, small internal hemorrhoids, normal colon biopsies. Colonoscopy in 5-10 years.    COLONOSCOPY WITH PROPOFOL N/A 06/02/2021   pancolonic diverticulosis. Two 4-6 mm polyps in transverse colon. Sessile serrated and hyperplastic. 5 year surveillance if benefits outweigh the risks.   COLOSTOMY  05/23/1979    COLOSTOMY REVERSAL  11/21/1979   EP IMPLANTABLE DEVICE N/A 06/25/2016   Procedure: Lead Revision/Repair;  Surgeon: Will Meredith Leeds, MD;  Location: [facility] CV LAB;  Service: Cardiovascular;  Laterality: N/A;   EP IMPLANTABLE DEVICE N/A 06/25/2016   Procedure: Pacemaker Implant;  Surgeon: Will Meredith Leeds, MD;  Location: [facility] CV LAB;  Service: Cardiovascular;  Laterality: N/A;   EXCISIONAL HEMORRHOIDECTOMY  02/20/1969   EYE SURGERY Left 06/22/1998   "branch vein occlusion"   EYE SURGERY Left ~ 2001   "smoothed out wrinkle"   LEFT OOPHORECTOMY  05/23/1979   nicked bowel, peritonitis, colostomy; colostomy reversed 1981    LOWER EXTREMITY ANGIOGRAM N/A 01/03/2014   Procedure: LOWER EXTREMITY ANGIOGRAM;  Surgeon: Wellington Hampshire, MD;  Location: [facility] CATH LAB;  Service: Cardiovascular;  Laterality: N/A;   Nuclear med stress test  10/21/2011   Small area of mild ischemia inferoapically.   PARTIAL HYSTERECTOMY  02/20/1969   left ovaries, then ovaries removed later due tumors    POLYPECTOMY  06/02/2021   Procedure: POLYPECTOMY;  Surgeon: Eloise Harman, DO;  [facility];  Service: Endoscopy;;   right eye surgery  03/2021   RIGHT OOPHORECTOMY  02/20/1969    FAMILY HISTORY Family History  Problem Relation Age of Onset   Heart disease Mother        deceased   Heart disease Father        deceased, heart disease   Diabetes Brother    Heart disease Brother    Thyroid disease Brother    Heart disease Sister    Heart disease Brother    Thyroid disease Brother    Lupus Daughter    Colon cancer Neg Hx     SOCIAL HISTORY Social History   Tobacco Use   Smoking status: Never    Passive exposure: Never   Smokeless tobacco: Never   Tobacco comments:    Never smoked  Vaping Use   Vaping Use: Never used  Substance Use Topics   Alcohol use: No    Alcohol/week: 0.0 standard drinks of alcohol   Drug use: No         OPHTHALMIC EXAM:  Base Eye Exam      Visual Acuity (ETDRS)       Right Left   Dist cc 20/40 -1 CF at 2'    Correction: Glasses         Tonometry (Tonopen, 9:17 AM)       Right Left   Pressure 9 10         Pupils       Pupils APD   Right PERRL None   Left PERRL  None         Visual Fields       Left Right    Full Full         Extraocular Movement       Right Left    Full Full         Neuro/Psych     Oriented x3: Yes   Mood/Affect: Normal         Dilation     Both eyes: 1.0% Mydriacyl, 2.5% Phenylephrine @ 9:17 AM           Slit Lamp and Fundus Exam     External Exam       Right Left   External Normal Normal         Slit Lamp Exam       Right Left   Lids/Lashes Normal Normal   Conjunctiva/Sclera White and quiet White and quiet   Cornea Clear Clear   Anterior Chamber Deep and quiet Deep and quiet   Iris Round and reactive Round and reactive   Lens Posterior chamber intraocular lens Posterior chamber intraocular lens   Anterior Vitreous Normal, Normal         Fundus Exam       Right Left   Posterior Vitreous Clear and avitric, no debris visible Posterior vitreous detachment   Disc Normal Atrophy, 1+ Pallor   C/D Ratio 0.2 0.45   Macula Hard drusen, , Microaneurysms, Retinal pigment epithelial mottling, no hemorrhage, no exudates, no macular thickening, Cystoid macular edema Hard drusen, , Pigmented atrophy   Vessels Old branch retinal vein occlusion Old branch retinal vein occlusion   Periphery Normal,  , no retinal hole, no retinal tear, good peripheral PRP sector Scattered laser therapy            IMAGING AND PROCEDURES  Imaging and Procedures for 12/08/21  OCT, Retina - OU - Both Eyes       Right Eye Quality was good. Scan locations included subfoveal. Central Foveal Thickness: 315. Progression has improved. Findings include abnormal foveal contour.   Left Eye Quality was good. Scan locations included subfoveal. Central Foveal Thickness: 223.  Progression has been stable. Findings include disciform scar, subretinal scarring.   Notes Much improved and vastly improved CME from BRVO, with underlying diabetic retinopathy post vitrectomy for floaters, with slight recurrence may be associated with evanescent blood pressure issues currently.  Now maintained on 6-week injection post Eylea.  We will repeat injection today and maintain 5-week follow-up OD  OS, no active maculopathy, with diffuse atrophy, cicatricial scarring Accounts for acuity     Intravitreal Injection, Pharmacologic Agent - OD - Right Eye       Time Out 12/08/2021. 9:52 AM. Confirmed correct patient, procedure, site, and patient consented.   Anesthesia Topical anesthesia was used. Anesthetic medications included Lidocaine 4%.   Procedure Preparation included 5% betadine to ocular surface, 10% betadine to eyelids, Tobramycin 0.3%. A 30 gauge needle was used.   Injection: 2 mg aflibercept 2 MG/0.05ML   Route: Intravitreal, Site: Right Eye   NDC: [id number], Lot: [id number], Waste: 0 mL   Post-op Post injection exam found visual acuity of at least counting fingers. The patient tolerated the procedure well. There were no complications. The patient received written and verbal post procedure care education. Post injection medications included ocuflox.              ASSESSMENT/PLAN:  Advanced nonexudative age-related macular degeneration of left eye with subfoveal involvement Stable OS  accounts for acuity no CNVM  Branch retinal vein occlusion with macular edema of right eye Today at 6-week follow-up, recurrence of CME minor, stable acuity.  Patient also undergoing current blood pressure changes and at times poor control due to a decrease in recent medications  Stable hemispheric branch retinal vein occlusion (BRVO) of left eye No active CMS, diffuse atrophy remains  Stable central retinal vein occlusion of left eye No active CMS, diffuse atrophy  remains  History of vitrectomy Doing well no new floaters, vision improved     ICD-10-CM   1. Branch retinal vein occlusion with macular edema of right eye  H34.8310 OCT, Retina - OU - Both Eyes    Intravitreal Injection, Pharmacologic Agent - OD - Right Eye    aflibercept (EYLEA) SOLN 2 mg    2. Advanced nonexudative age-related macular degeneration of left eye with subfoveal involvement  H35.3124     3. Stable hemispheric branch retinal vein occlusion (BRVO) of left eye  H34.8322     4. Stable central retinal vein occlusion of left eye  H34.8122     5. History of vitrectomy  Z98.890       1.  OD with recurrence of CME minor with stable acuity at 6-week interval follow-up post Eylea.  Repeat injection today Eylea and follow-up next in 5 weeks to maintain monocular status  2.  OS limited vision by diffuse retinal atrophy from prior retinal vein occlusion also combined with geographic atrophy of dry AMD.  3.  Ophthalmic Meds Ordered this visit:  Meds ordered this encounter  Medications   aflibercept (EYLEA) SOLN 2 mg       Return in about 5 weeks (around 01/12/2022) for dilate, OD, EYLEA OCT.  There are no Patient Instructions on file for this visit.   Explained the diagnoses, plan, and follow up with the patient and they expressed understanding.  Patient expressed understanding of the importance of proper follow up care.   Clent Demark Morna Flud M.D. Diseases & Surgery of the Retina and Vitreous [facility] 12/08/21     Abbreviations: M myopia (nearsighted); A astigmatism; H hyperopia (farsighted); P presbyopia; Mrx spectacle prescription;  CTL contact lenses; OD right eye; OS left eye; OU both eyes  XT exotropia; ET esotropia; PEK punctate epithelial keratitis; PEE punctate epithelial erosions; DES dry eye syndrome; MGD meibomian gland dysfunction; ATs artificial tears; PFAT's preservative free artificial tears; [place] nuclear sclerotic cataract; PSC posterior  subcapsular cataract; ERM epi-retinal membrane; PVD posterior vitreous detachment; RD retinal detachment; DM diabetes mellitus; DR diabetic retinopathy; NPDR non-proliferative diabetic retinopathy; PDR proliferative diabetic retinopathy; CSME clinically significant macular edema; DME diabetic macular edema; dbh dot blot hemorrhages; CWS cotton wool spot; POAG primary open angle glaucoma; C/D cup-to-disc ratio; HVF humphrey visual field; GVF goldmann visual field; OCT optical coherence tomography; IOP intraocular pressure; BRVO Branch retinal vein occlusion; CRVO central retinal vein occlusion; CRAO central retinal artery occlusion; BRAO branch retinal artery occlusion; RT retinal tear; SB scleral buckle; PPV pars plana vitrectomy; VH Vitreous hemorrhage; PRP panretinal laser photocoagulation; IVK intravitreal kenalog; VMT vitreomacular traction; MH Macular hole;  NVD neovascularization of the disc; NVE neovascularization elsewhere; AREDS age related eye disease study; ARMD age related macular degeneration; POAG primary open angle glaucoma; EBMD epithelial/anterior basement membrane dystrophy; ACIOL anterior chamber intraocular lens; IOL intraocular lens; PCIOL posterior chamber intraocular lens; Phaco/IOL phacoemulsification with intraocular lens placement; [place] photorefractive keratectomy; LASIK laser assisted in situ keratomileusis; HTN hypertension; DM diabetes mellitus; COPD chronic  obstructive pulmonary disease

## 2021-12-09 ENCOUNTER — Encounter: Payer: Self-pay | Admitting: Physician Assistant

## 2021-12-09 ENCOUNTER — Ambulatory Visit (INDEPENDENT_AMBULATORY_CARE_PROVIDER_SITE_OTHER): Payer: HMO | Admitting: Physician Assistant

## 2021-12-09 VITALS — BP 142/80 | HR 78 | Ht 63.0 in | Wt 141.4 lb

## 2021-12-09 DIAGNOSIS — G4733 Obstructive sleep apnea (adult) (pediatric): Secondary | ICD-10-CM | POA: Diagnosis not present

## 2021-12-09 DIAGNOSIS — Z9989 Dependence on other enabling machines and devices: Secondary | ICD-10-CM

## 2021-12-09 DIAGNOSIS — I495 Sick sinus syndrome: Secondary | ICD-10-CM | POA: Diagnosis not present

## 2021-12-09 DIAGNOSIS — I480 Paroxysmal atrial fibrillation: Secondary | ICD-10-CM | POA: Diagnosis not present

## 2021-12-09 DIAGNOSIS — I2510 Atherosclerotic heart disease of native coronary artery without angina pectoris: Secondary | ICD-10-CM

## 2021-12-09 DIAGNOSIS — I951 Orthostatic hypotension: Secondary | ICD-10-CM | POA: Diagnosis not present

## 2021-12-09 DIAGNOSIS — I10 Essential (primary) hypertension: Secondary | ICD-10-CM | POA: Diagnosis not present

## 2021-12-09 NOTE — Patient Instructions (Signed)
Medication Instructions:  Your physician has recommended you make the following change in your medication:  Stop Diltiazem   Labwork: None  Testing/Procedures: None  Follow-Up: Follow up with Dr. Curt Bears in September. Follow up with Ermalinda Barrios, PA-C in July/August Keep scheduled follow up with Dr. Domenic Polite.   Any Other Special Instructions Will Be Listed Below (If Applicable).     If you need a refill on your cardiac medications before your next appointment, please call your pharmacy.

## 2021-12-15 DIAGNOSIS — I509 Heart failure, unspecified: Secondary | ICD-10-CM | POA: Diagnosis not present

## 2021-12-15 DIAGNOSIS — I4891 Unspecified atrial fibrillation: Secondary | ICD-10-CM | POA: Diagnosis not present

## 2021-12-15 DIAGNOSIS — D696 Thrombocytopenia, unspecified: Secondary | ICD-10-CM | POA: Diagnosis not present

## 2021-12-15 DIAGNOSIS — Z79890 Hormone replacement therapy (postmenopausal): Secondary | ICD-10-CM | POA: Diagnosis not present

## 2021-12-15 DIAGNOSIS — I130 Hypertensive heart and chronic kidney disease with heart failure and stage 1 through stage 4 chronic kidney disease, or unspecified chronic kidney disease: Secondary | ICD-10-CM | POA: Diagnosis not present

## 2021-12-15 DIAGNOSIS — Z6824 Body mass index (BMI) 24.0-24.9, adult: Secondary | ICD-10-CM | POA: Diagnosis not present

## 2021-12-15 DIAGNOSIS — E039 Hypothyroidism, unspecified: Secondary | ICD-10-CM | POA: Diagnosis not present

## 2021-12-15 DIAGNOSIS — N183 Chronic kidney disease, stage 3 unspecified: Secondary | ICD-10-CM | POA: Diagnosis not present

## 2021-12-15 DIAGNOSIS — Z7901 Long term (current) use of anticoagulants: Secondary | ICD-10-CM | POA: Diagnosis not present

## 2021-12-17 DIAGNOSIS — N189 Chronic kidney disease, unspecified: Secondary | ICD-10-CM | POA: Diagnosis not present

## 2021-12-17 DIAGNOSIS — I5032 Chronic diastolic (congestive) heart failure: Secondary | ICD-10-CM | POA: Diagnosis not present

## 2021-12-17 DIAGNOSIS — E559 Vitamin D deficiency, unspecified: Secondary | ICD-10-CM | POA: Diagnosis not present

## 2021-12-17 DIAGNOSIS — I129 Hypertensive chronic kidney disease with stage 1 through stage 4 chronic kidney disease, or unspecified chronic kidney disease: Secondary | ICD-10-CM | POA: Diagnosis not present

## 2021-12-17 DIAGNOSIS — E1122 Type 2 diabetes mellitus with diabetic chronic kidney disease: Secondary | ICD-10-CM | POA: Diagnosis not present

## 2021-12-17 DIAGNOSIS — R809 Proteinuria, unspecified: Secondary | ICD-10-CM | POA: Diagnosis not present

## 2021-12-17 DIAGNOSIS — E1129 Type 2 diabetes mellitus with other diabetic kidney complication: Secondary | ICD-10-CM | POA: Diagnosis not present

## 2021-12-17 DIAGNOSIS — E211 Secondary hyperparathyroidism, not elsewhere classified: Secondary | ICD-10-CM | POA: Diagnosis not present

## 2021-12-18 ENCOUNTER — Other Ambulatory Visit: Payer: Self-pay | Admitting: Internal Medicine

## 2021-12-18 DIAGNOSIS — E1159 Type 2 diabetes mellitus with other circulatory complications: Secondary | ICD-10-CM

## 2021-12-22 MED ORDER — NOVOLIN N FLEXPEN 100 UNIT/ML ~~LOC~~ SUPN: PEN_INJECTOR | SUBCUTANEOUS | 1 refills | Status: DC

## 2021-12-22 NOTE — Addendum Note (Signed)
Addended by: Lauralyn Primes on: 12/22/2021 05:45 PM   Modules accepted: Orders

## 2021-12-24 DIAGNOSIS — E211 Secondary hyperparathyroidism, not elsewhere classified: Secondary | ICD-10-CM | POA: Diagnosis not present

## 2021-12-24 DIAGNOSIS — R809 Proteinuria, unspecified: Secondary | ICD-10-CM | POA: Diagnosis not present

## 2021-12-24 DIAGNOSIS — I5032 Chronic diastolic (congestive) heart failure: Secondary | ICD-10-CM | POA: Diagnosis not present

## 2021-12-24 DIAGNOSIS — E1129 Type 2 diabetes mellitus with other diabetic kidney complication: Secondary | ICD-10-CM | POA: Diagnosis not present

## 2021-12-24 DIAGNOSIS — N189 Chronic kidney disease, unspecified: Secondary | ICD-10-CM | POA: Diagnosis not present

## 2021-12-24 DIAGNOSIS — E1122 Type 2 diabetes mellitus with diabetic chronic kidney disease: Secondary | ICD-10-CM | POA: Diagnosis not present

## 2021-12-24 DIAGNOSIS — E559 Vitamin D deficiency, unspecified: Secondary | ICD-10-CM | POA: Diagnosis not present

## 2021-12-24 DIAGNOSIS — I129 Hypertensive chronic kidney disease with stage 1 through stage 4 chronic kidney disease, or unspecified chronic kidney disease: Secondary | ICD-10-CM | POA: Diagnosis not present

## 2021-12-30 ENCOUNTER — Ambulatory Visit (INDEPENDENT_AMBULATORY_CARE_PROVIDER_SITE_OTHER): Payer: HMO | Admitting: Podiatry

## 2021-12-30 ENCOUNTER — Encounter: Payer: Self-pay | Admitting: Podiatry

## 2021-12-30 DIAGNOSIS — M79674 Pain in right toe(s): Secondary | ICD-10-CM

## 2021-12-30 DIAGNOSIS — E119 Type 2 diabetes mellitus without complications: Secondary | ICD-10-CM

## 2021-12-30 DIAGNOSIS — Z7901 Long term (current) use of anticoagulants: Secondary | ICD-10-CM | POA: Diagnosis not present

## 2021-12-30 DIAGNOSIS — B351 Tinea unguium: Secondary | ICD-10-CM

## 2021-12-30 DIAGNOSIS — M79675 Pain in left toe(s): Secondary | ICD-10-CM

## 2021-12-30 NOTE — Progress Notes (Signed)
Cardiology Office Note    Date:  12/30/2021   ID:  Virginia Huber, DOB 1945/11/18, MRN [id number]   PCP:  Sharilyn Sites, [place]  Cardiologist:  Rozann Lesches, MD   Advanced Practice Provider:  No care team member to display Electrophysiologist:  Will Meredith Leeds, MD   [phone]   No chief complaint on file.   History of Present Illness:  Virginia Huber is a 76 y.o. female with history CAD status post angioplasty of the OM1 in 2016, DES to the circumflex in 2016, and DES to the RCA in 2017.  Paroxysmal atrial fibrillation with very low rhythm burden based on device interrogation.  She remains on low-dose amiodarone along with Coreg, Cardizem CD, and Eliquis, SSS s/p medtronic pacemaker, HTN, CKD 3b, HFpEF 55-60% 2021.   Last saw Dr. Domenic Polite 07/28/21 and doing well.    I saw the patient 11/05/21 with dizziness after walking short distance. She was orthostatic in Dr. Cornelia Copa office but not ours-she hadn't taken meds yet. She also had lost 20 lbs. I reduced her diltiazem from 360 mg down to 180 mg and lasix 40 mg daily.    I saw her back 12/03/21 and she was still extremely dizzy. BP still running low and renal stopped hydralazine. Some BP readings are in the 30'Q systolic. yesterday she says her BP was low 85/50's and HR in 90's. She gets uncomfortable when HR goes up.She is due for pacer check in July. I stopped lasix, potassium and olmesartan.  I saw 12/09/21 and still dizzy and low BP. I stopped diltiazem completely.   Patient went to ED 01/10/22 after a fall and found to have mildly displaced fracture of distal shaft of 5th metatarsal. Patient was awakened with leg cramps 4 am and got up to get a tylenol. She became dizzy, laid her head on counter, felt better and raised up. When she turned she fell and broke her foot. She doesn't think she passed out. BP readings range 87/66-167/102 but mostly 110-120/70's range.    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 h/o PAF/flutter during admission in 2013 for PNA. b. PAF during adm for NSTEMI 07/2015, subsequent paroxysms since then.   B12 deficiency anemia    Branch retinal vein occlusion with macular edema of right eye 10/11/2019   Chronic renal failure, stage 3b ([place]) 09/23/2017   Coronary artery disease 11/30/2014   a. remote MI. b. h/o PTCA with scoring balloon to OM1 11/2014. c. NSTEMI 03/2015 s/p DES to prox-mid Cx. d. NSTEMI 07/2015 s/p scoring balloon/PTCA/DES to dRCA with PAF during that admission   Cutaneous lupus erythematosus    Early stage nonexudative age-related macular degeneration of right eye 02/27/2020   Essential hypertension    GERD (gastroesophageal reflux disease)    History of blood transfusion 1980's   2nd surgical procedures   Hypercholesteremia    Hypothyroidism    Myocardial infarction ([place]) 02/2012   NSTEMI (non-ST elevated myocardial infarction) ([place]) 04/02/2015   OSA (obstructive sleep apnea) 05/13/2016   Ovarian tumor    PAD (peripheral artery disease) ([place])    a. s/p LE angio 2015; followed by Dr.  Arida - managed medically.   Pericardial effusion    a. 06/2016 after ppm - s/p pericardiocentesis.   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 s/p Medtronic PPM 06/2016, c/b lead perf/pericardial effusion.   TIA (transient ischemic  attack)    Type 2 diabetes with nephropathy ([place]) 02/29/2012    Past Surgical History:  Procedure Laterality Date   ABDOMINAL AORTAGRAM N/A 01/03/2014   Procedure: ABDOMINAL [name];  Surgeon: Wellington Hampshire, MD;  Location: [facility];  Service: Cardiovascular;  Laterality: N/A;   ABDOMINAL HYSTERECTOMY  06/22/1970   "partial"   APPENDECTOMY  02/20/1969   CARDIAC CATHETERIZATION  06/22/2006   Tiny OM-2 with 90% narrowing. Med tx.   CARDIAC CATHETERIZATION N/A 11/30/2014   Procedure: Left Heart Cath and Coronary Angiography;  Surgeon: Troy Sine, MD; LAD 20%, CFX 50%, OM1 95%, right PLB 30%, LV normal    CARDIAC CATHETERIZATION N/A 11/30/2014   Procedure: Coronary Balloon Angioplasty;  Surgeon: Troy Sine, MD;  Angiosculpt scoring balloon and PTCA to the OM1 reducing stenosis from 95% to less than 10%   CARDIAC CATHETERIZATION N/A 04/03/2015   Procedure: Left Heart Cath and Coronary Angiography;  Surgeon: Jolaine Artist, MD; dLAD 50%, CFX 90%, OM1 100%, PLA 15%, LVEDP 13     CARDIAC CATHETERIZATION N/A 04/03/2015   Procedure: Coronary Stent Intervention;  Surgeon: Sherren Mocha, MD; 3.0x18 mm Xience DES to the CFX     CARDIAC CATHETERIZATION N/A 08/02/2015   Procedure: Left Heart Cath and Coronary Angiography;  Surgeon: Troy Sine, MD;  Location: [facility];  Service: Cardiovascular;  Laterality: N/A;   CARDIAC CATHETERIZATION N/A 08/02/2015   Procedure: Coronary Stent Intervention;  Surgeon: Troy Sine, MD;  Location: [facility];  Service: Cardiovascular;  Laterality: N/A;   CARDIAC CATHETERIZATION N/A 06/25/2016   Procedure: Pericardiocentesis;  Surgeon: Will Meredith Leeds, MD;  Location: [facility];  Service: Cardiovascular;  Laterality: N/A;   cardiac stents     CARDIOVERSION N/A 12/15/2017   Procedure: CARDIOVERSION;  Surgeon: Acie Fredrickson Wonda Cheng, MD;  Location: [facility] ENDOSCOPY;  Service: Cardiovascular;  Laterality: N/A;    CHOLECYSTECTOMY OPEN  02/20/1989   COLONOSCOPY  06/23/2003   Dr. Laural Golden: pancolonic divericula, polyp, path unknown currently   COLONOSCOPY  06/22/2010   Dr. Oneida Alar: Normal TI, scattered diverticula in entire colon, small internal hemorrhoids, normal colon biopsies. Colonoscopy in 5-10 years.    COLONOSCOPY WITH PROPOFOL N/A 06/02/2021   pancolonic diverticulosis. Two 4-6 mm polyps in transverse colon. Sessile serrated and hyperplastic. 5 year surveillance if benefits outweigh the risks.   COLOSTOMY  05/23/1979   COLOSTOMY REVERSAL  11/21/1979   EP IMPLANTABLE DEVICE N/A 06/25/2016   Procedure: Lead Revision/Repair;  Surgeon: Will Meredith Leeds, MD;  Location: [facility];  Service: Cardiovascular;  Laterality: N/A;   EP IMPLANTABLE DEVICE N/A 06/25/2016   Procedure: Pacemaker Implant;  Surgeon: Will Meredith Leeds, MD;  Location: [facility];  Service: Cardiovascular;  Laterality: N/A;   EXCISIONAL HEMORRHOIDECTOMY  02/20/1969   EYE SURGERY Left 06/22/1998   "branch vein occlusion"   EYE SURGERY Left ~ 2001   "smoothed out wrinkle"  LEFT OOPHORECTOMY  05/23/1979   nicked bowel, peritonitis, colostomy; colostomy reversed 1981    LOWER EXTREMITY ANGIOGRAM N/A 01/03/2014   Procedure: LOWER EXTREMITY ANGIOGRAM;  Surgeon: Wellington Hampshire, MD;  Location: [facility];  Service: Cardiovascular;  Laterality: N/A;   Nuclear med stress test  10/21/2011   Small area of mild ischemia inferoapically.   PARTIAL HYSTERECTOMY  02/20/1969   left ovaries, then ovaries removed later due tumors    POLYPECTOMY  06/02/2021   Procedure: POLYPECTOMY;  Surgeon: Eloise Harman, DO;  [facility];  Service: Endoscopy;;   right eye surgery  03/2021   RIGHT OOPHORECTOMY  02/20/1969    Current Medications: No outpatient medications have been marked as taking for the 01/13/22 encounter (Appointment) with Imogene Burn, PA-C.     Allergies:   Penicillins and Percocet  [oxycodone-acetaminophen]   Social History   Socioeconomic History   Marital status: Married    Spouse name: Not on file   Number of children: Not on file   Years of education: Not on file   Highest education level: Not on file  Occupational History   Occupation: Retired    Fish farm manager: RETIRED    Comment: insurance billing  Tobacco Use   Smoking status: Never    Passive exposure: Never   Smokeless tobacco: Never   Tobacco comments:    Never smoked  Vaping Use   Vaping Use: Never used  Substance and Sexual Activity   Alcohol use: No    Alcohol/week: 0.0 standard drinks of alcohol   Drug use: No   Sexual activity: Never    Birth control/protection: Surgical    Comment: hyst  Other Topics Concern   Not on file  Social History Narrative   Not on file   Social Determinants of Health   Financial Resource Strain: Not on file  Food Insecurity: Not on file  Transportation Needs: Not on file  Physical Activity: Not on file  Stress: Not on file  Social Connections: Not on file     Family History:  The patient's  family history includes Diabetes in her brother; Heart disease in her brother, brother, father, mother, and sister; Lupus in her daughter; Thyroid disease in her brother and brother.   ROS:   Please see the history of present illness.    ROS All other systems reviewed and are negative.   PHYSICAL EXAM:   VS:  There were no vitals taken for this visit.  Physical Exam  GEN: Well nourished, well developed, in no acute distress  HEENT: normal  Neck: no JVD, carotid bruits, or masses Cardiac:RRR; no murmurs, rubs, or gallops  Respiratory:  clear to auscultation bilaterally, normal work of breathing GI: soft, nontender, nondistended, + BS Ext: without cyanosis, clubbing, or edema, Good distal pulses bilaterally MS: no deformity or atrophy  Skin: warm and dry, no rash Neuro:  Alert and Oriented x 3, Strength and sensation are intact Psych: euthymic mood, full  affect  Wt Readings from Last 3 Encounters:  12/09/21 141 lb 6.4 oz (64.1 kg)  12/03/21 142 lb (64.4 kg)  11/05/21 142 lb 6.4 oz (64.6 kg)      Studies/Labs Reviewed:   EKG:  EKG is not ordered today.    Recent Labs: 05/29/2021: BUN 25; Creatinine, Ser 1.58; Potassium 4.0; Sodium 140 08/25/2021: ALT 21 10/09/2021: TSH 1.18 11/05/2021: Hemoglobin 13.9; Platelets 295   Lipid Panel    Component Value Date/Time   CHOL 166 08/05/2017 0807  TRIG 160 (H) 08/05/2017 0807   HDL 43 (L) 08/05/2017 0807   CHOLHDL 3.9 08/05/2017 0807   VLDL NOT CALC 04/14/2016 0910   LDLCALC 96 08/05/2017 0807    Additional studies/ records that were reviewed today include:    angiography in 12/2013 which showed:   1. No significant aortoiliac disease.  2. Severe focal left popliteal artery stenosis with occluded tibioperoneal Vessels. The anterior tibial is occluded proximally with reconstitution. This represents the only patent vessel to the foot.  3. Two-vessel runoff on the right side with diffuse disease affecting the right anterior tibial and peroneal arteries.    She improved significantly with cilostazol and a walking program. Her symptoms currently not lifestyle limiting. She has been suffering from left hip pain which is thought to be due to bursitis over the last few months. This has limited her physical activities.  TTE 08/04/17 Review of the above records today demonstrates:  - Left ventricle: The cavity size was normal. Wall thickness was   increased in a pattern of moderate LVH. Systolic function was   normal. The estimated ejection fraction was in the range of 55%   to 60%. Wall motion was normal; there were no regional wall   motion abnormalities. Features are consistent with a pseudonormal   left ventricular filling pattern, with concomitant abnormal   relaxation and increased filling pressure (grade 2 diastolic   dysfunction). Doppler parameters are consistent with high   ventricular  filling pressure. - Left atrium: The atrium was severely dilated. - Right ventricle: Pacer wire or catheter noted in right ventricle. - Right atrium: Pacer wire or catheter noted in right atrium.   Cath  08/02/15 1st RPLB lesion, 50% stenosed. Dist RCA lesion, 30% stenosed. Ost 1st Mrg to 1st Mrg lesion, 100% stenosed. Ost LAD lesion, 40% stenosed. Mid LAD lesion, 40% stenosed. Post Atrio lesion, 90% stenosed. Post intervention, there is a 0% residual stenosis. The left ventricular systolic function is normal. Mid RCA lesion, 20% stenosed.   Normal LV function without focal segmental wall motion abnormalities and ejection fraction of 55%.   Successful percutaneous cardiac intervention to the distal RCA treated with Angiosculpt scoring balloon, PTCA, and ultimate stenting with a 2.58 mm Xience Alpine DES stent postdilated to 2.51 mm with a 90% stenosis reduced to 0% and no change in the ostial PDA narrowing.  Risk Assessment/Calculations:    CHA2DS2-VASc Score = 9   This indicates a 12.2% annual risk of stroke. The patient's score is based upon: CHF History: 1 HTN History: 1 Diabetes History: 1 Stroke History: 2 (retinal vein occlusion) Vascular Disease History: 1 Age Score: 2 Gender Score: 1        ASSESSMENT:    No diagnosis found.   PLAN:  In order of problems listed above:  Orthostatic Dizziness I have stopped diltiazem lasix, K, olmesartan, Hydralazine stopped by renal. BP as low as 87/66 but average 110-120/70's with some high readings. [place] and broke her foot 01/10/22. No clear syncope but was dizzy and rested before it happened. Was told not to wear compression hose by Dr. Fletcher Anon due to PAD. Will reduce coreg 6.25 mg bid and keep f/u with Dr. Domenic Polite. She's'staying hydrated. Abdominal binder.  Labs have been stable-Crt 1.79 11/05/21. Hbg stable 10/2021   CAD status post STEMI 2016 PCI to the OM1. DES to the circumflex 2016, non-STEMI in 2017 with DES to the RCA.  No  angina   PAF on eliquis and amiodarone, coreg No fast  arrhythmias on device checks.No bleeding problems.   SSS S/P Pacemaker followed by Dr. Curt Bears   HTN now with hypotension-reduce coreg 6.25 mg bid   OSA on CPAP   5th metatarsal fracture 01/10/22 after fall. She sees ortho today.  Shared Decision Making/Informed Consent        Medication Adjustments/Labs and Tests Ordered: Current medicines are reviewed at length with the patient today.  Concerns regarding medicines are outlined above.  Medication changes, Labs and Tests ordered today are listed in the Patient Instructions below. There are no Patient Instructions on file for this visit.   Signed, Ermalinda Barrios, PA-C  12/30/2021 2:57 PM    [facility] [address], [place], [place]  [address] Phone: [phone]; Fax: [phone]

## 2021-12-30 NOTE — Progress Notes (Signed)
Subjective: Virginia Huber is a 76 y.o. female patient with history of diabetes who presents to office today complaining of long,mildly painful nails while ambulating in shoes; unable to trim. Reports that she has some soreness at the right great toe but states she wants to wait on getting ingrown nail procedure.  No other pedal complaints noted.  Blood sugar this morning not recorded A1c 7.3 Sharilyn Sites, MD  Patient Active Problem List   Diagnosis Date Noted   Stable hemispheric branch retinal vein occlusion (BRVO) of left eye 12/08/2021   History of vitrectomy 12/08/2021   History of colonic polyps 05/13/2021   Vitreous membranes and strands, right 01/21/2021   Advanced nonexudative age-related macular degeneration of left eye with subfoveal involvement 06/26/2020   Stable central retinal vein occlusion of left eye 05/13/2020   Early stage nonexudative age-related macular degeneration of right eye 02/27/2020   OSA on CPAP 02/12/2020   Branch retinal vein occlusion with macular edema of right eye 10/11/2019   Severe nonproliferative diabetic retinopathy of right eye, with macular edema, associated with type 2 diabetes mellitus ([place]) 10/11/2019   Right retinal defect 10/11/2019   Retinal microaneurysm of right eye 10/11/2019   CHF exacerbation ([place]) 08/25/2019   Acute exacerbation of CHF (congestive heart failure) ([place]) 08/24/2019   Acute on chronic diastolic HF (heart failure) ([place]) 12/17/2017   GERD (gastroesophageal reflux disease) 12/17/2017   Typical atrial flutter (HCC)    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   Paroxysmal atrial fibrillation ([place]) 12/08/2017   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/08/2017   Chronic renal failure, stage 3b ([place]) 09/23/2017   Fever 09/23/2017   S/P pericardiocentesis 06/28/2016   Acute blood loss anemia 06/28/2016   Pericardial effusion 06/26/2016   Tachy-brady syndrome ([place]) 06/25/2016   Tamponade     Bradycardia 06/14/2016   Junctional bradycardia    Coronary artery disease involving coronary bypass graft of native heart with unstable angina pectoris (HCC)    Acute diastolic CHF (congestive heart failure), NYHA class 3 (HCC)    Systolic congestive heart failure ([place]) 05/13/2016   Multiple and bilateral precerebral artery syndromes 05/13/2016   OSA (obstructive sleep apnea) 05/13/2016   Chronic diarrhea 12/25/2015   Chest pain 08/02/2015   Atrial fibrillation with rapid ventricular response (HCC)    Pain with urination 05/08/2015   NSTEMI (non-ST elevated myocardial infarction) ([place]) 04/02/2015   CAD in native artery 11/30/2014   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    PAD (peripheral artery disease) ([place]) 12/26/2013   Superficial fungus infection of skin 06/29/2013   UTI (urinary tract infection) 05/08/2013   Hypokalemia 03/05/2012   B12 deficiency anemia 03/02/2012   Bronchospasm 03/02/2012   Community acquired bacterial pneumonia 03/01/2012   Acute respiratory failure with hypoxia ([place]) 03/01/2012   Type 2 diabetes with nephropathy ([place]) 02/29/2012   Hypothyroidism 02/28/2012   RLQ abdominal pain 11/24/2010   OVERWEIGHT/OBESITY 06/03/2010   Essential hypertension 06/03/2010   Overweight 06/03/2010   Mixed hyperlipidemia 12/27/2009   Palpitations 05/17/2009   FRACTURE, TOE 12/06/2007   Current Outpatient Medications on File Prior to Visit  Medication Sig Dispense Refill   acetaminophen (TYLENOL) 325 MG tablet Take 650 mg by mouth every 6 (six) hours as needed for headache.     amiodarone (PACERONE) 200 MG tablet Take 0.5 tablets (100 mg total) by mouth daily. 45 tablet 1   apixaban (ELIQUIS) 5 MG TABS tablet Take 1  tablet (5 mg total) by mouth 2 (two) times daily. 28 tablet 0   atorvastatin (LIPITOR) 80 MG tablet TAKE ONE TABLET BY MOUTH EVERY DAY AT 6:00PM 90 tablet 3   carvedilol (COREG) 25 MG tablet Take 12.5 mg by  mouth 2 (two) times daily.     Cholecalciferol (VITAMIN D3 PO) Take 2,000 Units by mouth every evening.     clopidogrel (PLAVIX) 75 MG tablet TAKE ONE TABLET ('75MG'$  TOTAL) BY MOUTH DAILY 90 tablet 3   Continuous Blood Gluc Receiver (FREESTYLE LIBRE 2 READER) DEVI 1 each by Does not apply route daily. 1 each 0   Continuous Blood Gluc Sensor (FREESTYLE LIBRE 2 SENSOR) MISC USE ONE FOR FOURTEEN DAYS 6 each 3   glucose blood (FREESTYLE PRECISION NEO TEST) test strip Use as instructed 100 each 3   Insulin NPH, Human,, Isophane, (NOVOLIN N FLEXPEN) 100 UNIT/ML Kiwkpen Inject 25 units in am and 15 units before dinner 30 mL 1   insulin NPH-regular Human (NOVOLIN 70/30) (70-30) 100 UNIT/ML injection INJECT 35 UNITS BEFORE BREAKFAST AND 35 UNITS BEFORE SUPPER 45 mL 3   levothyroxine (SYNTHROID) 112 MCG tablet TAKE ONE TABLET BY MOUTH ONCE DAILY *New DOSE 45 tablet 3   nitroGLYCERIN (NITROSTAT) 0.4 MG SL tablet Place 1 tablet (0.4 mg total) under the tongue every 5 (five) minutes as needed for chest pain. 25 tablet 0   rOPINIRole (REQUIP) 0.5 MG tablet Take 0.5 mg by mouth at bedtime.   0   Semaglutide, 2 MG/DOSE, 8 MG/3ML SOPN Inject 2 mg as directed once a week. 9 mL 3   No current facility-administered medications on file prior to visit.   Allergies  Allergen Reactions   Penicillins Hives    Has patient had a PCN reaction causing immediate rash, facial/tongue/throat swelling, SOB or lightheadedness with hypotension: Yes Has patient had a PCN reaction causing severe rash involving mucus membranes or skin necrosis: No Has patient had a PCN reaction that required hospitalization No Has patient had a PCN reaction occurring within the last 10 years: No If all of the above answers are "NO", then may proceed with Cephalosporin use.   Percocet [Oxycodone-Acetaminophen] Nausea And Vomiting     Objective: General: Patient is awake, alert, and oriented x 3 and in no acute distress.  Integument: Skin is  warm, dry and supple bilateral. Nails are tender, long, thickened and dystrophic with subungual debris, consistent with onychomycosis, 1-5 bilateral. Mild curvature of bilateral hallux nails with most pain noted at the right hallux lateral border.  No significant surrounding redness, swelling, no active drainage, no acute signs of infection. No open lesions or preulcerative lesions present bilateral. Remaining integument unremarkable.  Vasculature:  Dorsalis Pedis pulse 1/4 bilateral. Posterior Tibial pulse  0/4 bilateral. Capillary fill time <3 sec 1-5 bilateral. Positive hair growth to the level of the digits.Temperature gradient within normal limits. No varicosities present bilateral. Trace edema present bilateral.   Neurology: Gross sensation present via light touch bilateral.  Protective sensation intact with Semmes Weinstein monofilament bilateral.  Musculoskeletal: Asymptomatic bunion and hammertoe with cross over with hammertoe with the right second toe most elevated.  Strength within normal limits.  No other acute findings or tenderness to palpation bilateral.  Assessment and Plan: Problem List Items Addressed This Visit   None   -Examined patient. -Discussed with patient treatment options for pain at left great toenail; at this time we will plan to trim the toenail however at next visit recommend a partial nail  avulsion of the right hallux lateral boarder if continues to be painful -Mechanically debrided all painful nails 1-5 bilateral using sterile nail nipper and filed with dremel without incident and removed offending border at  right hallux lateral nail fold without incident -Continue with Corticosporin solution as needed to bilateral hallux nails if there is any soreness and soaking especially the left foot tonight with Epson salt due to pain at the nail margin, right hallux lateral margin -Answered all patient questions -Patient to return  in 2-1/2 months for at risk foot care nail  trimming and nail avulsion procedure on the left when she is ready  -Patient advised to call the office if any problems or questions arise in the meantime.  Lorenda Peck, DPM

## 2022-01-03 ENCOUNTER — Other Ambulatory Visit: Payer: Self-pay | Admitting: Internal Medicine

## 2022-01-06 ENCOUNTER — Other Ambulatory Visit: Payer: Self-pay | Admitting: Internal Medicine

## 2022-01-07 ENCOUNTER — Ambulatory Visit (INDEPENDENT_AMBULATORY_CARE_PROVIDER_SITE_OTHER): Payer: HMO

## 2022-01-07 DIAGNOSIS — I495 Sick sinus syndrome: Secondary | ICD-10-CM

## 2022-01-08 ENCOUNTER — Telehealth: Payer: Self-pay | Admitting: Cardiology

## 2022-01-08 LAB — CUP PACEART REMOTE DEVICE CHECK
Battery Remaining Longevity: 59 mo
Battery Voltage: 2.99 V
Brady Statistic AP VP Percent: 0.01 %
Brady Statistic AP VS Percent: 32.91 %
Brady Statistic AS VP Percent: 0.05 %
Brady Statistic AS VS Percent: 67.03 %
Brady Statistic RA Percent Paced: 32.92 %
Brady Statistic RV Percent Paced: 0.06 %
Date Time Interrogation Session: 20230719091632
Implantable Lead Implant Date: 20180104
Implantable Lead Implant Date: 20180104
Implantable Lead Location: 753859
Implantable Lead Location: 753860
Implantable Lead Model: 5076
Implantable Lead Model: 5076
Implantable Pulse Generator Implant Date: 20180104
Lead Channel Impedance Value: 342 Ohm
Lead Channel Impedance Value: 380 Ohm
Lead Channel Impedance Value: 399 Ohm
Lead Channel Impedance Value: 456 Ohm
Lead Channel Pacing Threshold Amplitude: 0.75 V
Lead Channel Pacing Threshold Amplitude: 1.125 V
Lead Channel Pacing Threshold Pulse Width: 0.4 ms
Lead Channel Pacing Threshold Pulse Width: 0.4 ms
Lead Channel Sensing Intrinsic Amplitude: 1.625 mV
Lead Channel Sensing Intrinsic Amplitude: 1.625 mV
Lead Channel Sensing Intrinsic Amplitude: 15.25 mV
Lead Channel Sensing Intrinsic Amplitude: 15.25 mV
Lead Channel Setting Pacing Amplitude: 2 V
Lead Channel Setting Pacing Amplitude: 2.5 V
Lead Channel Setting Pacing Pulse Width: 0.4 ms
Lead Channel Setting Sensing Sensitivity: 2.8 mV

## 2022-01-08 MED ORDER — APIXABAN 5 MG PO TABS: 5.0000 mg | ORAL_TABLET | Freq: Two times a day (BID) | ORAL | 5 refills | Status: DC

## 2022-01-08 MED ORDER — CARVEDILOL 25 MG PO TABS: 12.5000 mg | ORAL_TABLET | Freq: Two times a day (BID) | ORAL | 3 refills | Status: DC

## 2022-01-08 NOTE — Telephone Encounter (Signed)
Prescription refill request for Eliquis received. Indication: PAF Last office visit: 11/05/21  Gerrianne Scale PA-C Scr: 1.58 on 05/29/21 Age: 76 Weight: 64.6kg  Based on above findings Eliquis '5mg'$  twice daily is the appropriate dose.  Refill approved.

## 2022-01-08 NOTE — Telephone Encounter (Signed)
I refilled coreg for patient.  I will forward Eliquis to L.Joneen Caraway, RN

## 2022-01-08 NOTE — Telephone Encounter (Signed)
*  STAT* If patient is at the pharmacy, call can be transferred to refill team.   1. Which medications need to be refilled? (please list name of each medication and dose if known)   apixaban (ELIQUIS) 5 MG TABS tablet    carvedilol (COREG) 25 MG tablet    2. Which pharmacy/location (including street and city if local pharmacy) is medication to be sent to? [facility] - [place], [place] - [place] [address]  3. Do they need a 30 day or 90 day supply?  90 day

## 2022-01-10 ENCOUNTER — Encounter: Payer: Self-pay | Admitting: Emergency Medicine

## 2022-01-10 ENCOUNTER — Ambulatory Visit (INDEPENDENT_AMBULATORY_CARE_PROVIDER_SITE_OTHER): Payer: PPO

## 2022-01-10 ENCOUNTER — Ambulatory Visit
Admission: EM | Admit: 2022-01-10 | Discharge: 2022-01-10 | Disposition: A | Discharge status: Home / Self Care | Payer: PPO | Attending: Nurse Practitioner | Admitting: Nurse Practitioner

## 2022-01-10 DIAGNOSIS — S92352A Displaced fracture of fifth metatarsal bone, left foot, initial encounter for closed fracture: Secondary | ICD-10-CM | POA: Diagnosis not present

## 2022-01-10 DIAGNOSIS — W19XXXA Unspecified fall, initial encounter: Secondary | ICD-10-CM | POA: Diagnosis not present

## 2022-01-10 DIAGNOSIS — M79672 Pain in left foot: Secondary | ICD-10-CM | POA: Diagnosis not present

## 2022-01-10 NOTE — Discharge Instructions (Addendum)
Your x-rays show you have fractured the fifth metatarsal of your left foot. Apply cam boot to the left lower extremity as soon as you get home.  Wear the cam boot with any ambulation or when you are up and moving. May take over-the-counter ibuprofen or Tylenol for pain or discomfort. RICE therapy rest, ice, compression, and elevation until your symptoms improve.  Apply ice for 20 minutes, remove for 1 hour, then repeat as often as possible.  This will help with pain and swelling. Follow-up with orthopedics within the next 48 to 72 hours.  You can follow-up with [facility] at [phone] or [facility] in [place] at [phone].

## 2022-01-10 NOTE — ED Triage Notes (Signed)
Fell this morning, pain on right side of left foot.

## 2022-01-10 NOTE — ED Provider Notes (Signed)
[facility]    CSN: [id number] Arrival date & time: 01/10/22  1057      History   Chief Complaint No chief complaint on file.   HPI Virginia Huber is a 76 y.o. female.   The history is provided by the patient.   Patient presents with left foot pain after she fell earlier this morning.  She states that she does not recollect how she may have fallen.  She now has pain to the left foot with swelling and ecchymosis. She reports a history of fracture to the left foot approximately 7 to 8 years ago. She has been taking Tylenol for her symptoms.  She denies numbness, tingling, inability to bear weight, or radiation of pain into the ankle.  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 h/o PAF/flutter during admission in 2013 for PNA. b. PAF during adm for NSTEMI 07/2015, subsequent paroxysms since then.   B12 deficiency anemia    Branch retinal vein occlusion with macular edema of right eye 10/11/2019   Chronic renal failure, stage 3b ([place]) 09/23/2017   Coronary artery disease 11/30/2014   a. remote MI. b. h/o PTCA with scoring balloon to OM1 11/2014. c. NSTEMI 03/2015 s/p DES to prox-mid Cx. d. NSTEMI 07/2015 s/p scoring balloon/PTCA/DES to dRCA with PAF during that admission   Cutaneous lupus erythematosus    Early stage nonexudative age-related macular degeneration of right eye 02/27/2020   Essential hypertension    GERD (gastroesophageal reflux disease)    History of blood transfusion 1980's   2nd surgical procedures   Hypercholesteremia    Hypothyroidism    Myocardial infarction ([place]) 02/2012   NSTEMI (non-ST elevated myocardial infarction) ([place]) 04/02/2015   OSA  (obstructive sleep apnea) 05/13/2016   Ovarian tumor    PAD (peripheral artery disease) ([place])    a. s/p LE angio 2015; followed by Dr. Fletcher Anon - managed medically.   Pericardial effusion    a. 06/2016 after ppm - s/p pericardiocentesis.   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 s/p [employer] PPM 06/2016, c/b lead perf/pericardial effusion.   TIA (transient ischemic attack)    Type 2 diabetes with nephropathy ([place]) 02/29/2012    Patient Active Problem List   Diagnosis Date Noted   Stable hemispheric branch retinal vein occlusion (BRVO) of left eye 12/08/2021   History of vitrectomy 12/08/2021   History of colonic polyps 05/13/2021   Vitreous membranes and strands, right 01/21/2021   Advanced nonexudative age-related macular degeneration of left eye with subfoveal involvement 06/26/2020   Stable central retinal vein occlusion of left eye 05/13/2020   Early stage nonexudative age-related macular degeneration of right eye 02/27/2020   OSA on CPAP 02/12/2020   Branch retinal vein occlusion with macular edema of right eye 10/11/2019   Severe nonproliferative diabetic retinopathy of right eye, with macular edema, associated with type 2 diabetes mellitus ([place]) 10/11/2019  Right retinal defect 10/11/2019   Retinal microaneurysm of right eye 10/11/2019   CHF exacerbation ([place]) 08/25/2019   Acute exacerbation of CHF (congestive heart failure) ([place]) 08/24/2019   Acute on chronic diastolic HF (heart failure) ([place]) 12/17/2017   GERD (gastroesophageal reflux disease) 12/17/2017   Typical atrial flutter (HCC)    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   Paroxysmal atrial  fibrillation ([place]) 12/08/2017   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   Chronic renal failure, stage 3b ([place]) 09/23/2017   Fever 09/23/2017   S/P pericardiocentesis 06/28/2016   Acute blood loss anemia 06/28/2016   Pericardial effusion 06/26/2016   Tachy-brady syndrome ([place]) 06/25/2016   Tamponade    Bradycardia 06/14/2016   Junctional bradycardia    Coronary artery disease involving coronary bypass graft of native heart with unstable angina pectoris (HCC)    Acute diastolic CHF (congestive heart failure), NYHA class 3 (HCC)    Systolic congestive heart failure ([place]) 05/13/2016   Multiple and bilateral precerebral artery syndromes 05/13/2016   OSA (obstructive sleep apnea) 05/13/2016   Chronic diarrhea 12/25/2015   Chest pain 08/02/2015   Atrial fibrillation with rapid ventricular response (HCC)    Pain with urination 05/08/2015   NSTEMI (non-ST elevated myocardial infarction) ([place]) 04/02/2015   CAD in native artery 11/30/2014   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    PAD (peripheral artery disease) ([place]) 12/26/2013   Superficial fungus infection of skin 06/29/2013   UTI (urinary tract infection) 05/08/2013   Hypokalemia 03/05/2012   B12 deficiency anemia 03/02/2012   Bronchospasm 03/02/2012   Community acquired bacterial pneumonia 03/01/2012   Acute respiratory failure with hypoxia ([place]) 03/01/2012   Type 2 diabetes with nephropathy ([place]) 02/29/2012   Hypothyroidism 02/28/2012   RLQ abdominal pain 11/24/2010   OVERWEIGHT/OBESITY 06/03/2010   Essential hypertension 06/03/2010   Overweight 06/03/2010   Mixed hyperlipidemia 12/27/2009   Palpitations 05/17/2009   FRACTURE, TOE 12/06/2007    Past Surgical History:  Procedure Laterality Date   ABDOMINAL AORTAGRAM N/A 01/03/2014   Procedure: ABDOMINAL [name];  Surgeon: Wellington Hampshire, MD;  Location: [facility];  Service: Cardiovascular;   Laterality: N/A;   ABDOMINAL HYSTERECTOMY  06/22/1970   "partial"   APPENDECTOMY  02/20/1969   CARDIAC CATHETERIZATION  06/22/2006   Tiny OM-2 with 90% narrowing. Med tx.   CARDIAC CATHETERIZATION N/A 11/30/2014   Procedure: Left Heart Cath and Coronary Angiography;  Surgeon: Troy Sine, MD; LAD 20%, CFX 50%, OM1 95%, right PLB 30%, LV normal    CARDIAC CATHETERIZATION N/A 11/30/2014   Procedure: Coronary Balloon Angioplasty;  Surgeon: Troy Sine, MD;  Angiosculpt scoring balloon and PTCA to the OM1 reducing stenosis from 95% to less than 10%   CARDIAC CATHETERIZATION N/A 04/03/2015   Procedure: Left Heart Cath and Coronary Angiography;  Surgeon: Jolaine Artist, MD; dLAD 50%, CFX 90%, OM1 100%, PLA 15%, LVEDP 13     CARDIAC CATHETERIZATION N/A 04/03/2015   Procedure: Coronary Stent Intervention;  Surgeon: Sherren Mocha, MD; 3.0x18 mm Xience DES to the CFX     CARDIAC CATHETERIZATION N/A 08/02/2015   Procedure: Left Heart Cath and Coronary Angiography;  Surgeon: Troy Sine, MD;  Location: [facility];  Service: Cardiovascular;  Laterality: N/A;   CARDIAC CATHETERIZATION N/A 08/02/2015   Procedure: Coronary Stent Intervention;  Surgeon: Troy Sine, MD;  Location: [facility];  Service: Cardiovascular;  Laterality: N/A;  CARDIAC CATHETERIZATION N/A 06/25/2016   Procedure: Pericardiocentesis;  Surgeon: Will Meredith Leeds, MD;  Location: [facility];  Service: Cardiovascular;  Laterality: N/A;   cardiac stents     CARDIOVERSION N/A 12/15/2017   Procedure: CARDIOVERSION;  Surgeon: Acie Fredrickson Wonda Cheng, MD;  Location[facility];  Service: Cardiovascular;  Laterality: N/A;   CHOLECYSTECTOMY OPEN  02/20/1989   COLONOSCOPY  06/23/2003   Dr. Laural Golden: pancolonic divericula, polyp, path unknown currently   COLONOSCOPY  06/22/2010   Dr. Oneida Alar: Normal TI, scattered diverticula in entire colon, small internal hemorrhoids, normal colon biopsies. Colonoscopy in  5-10 years.    COLONOSCOPY WITH PROPOFOL N/A 06/02/2021   pancolonic diverticulosis. Two 4-6 mm polyps in transverse colon. Sessile serrated and hyperplastic. 5 year surveillance if benefits outweigh the risks.   COLOSTOMY  05/23/1979   COLOSTOMY REVERSAL  11/21/1979   EP IMPLANTABLE DEVICE N/A 06/25/2016   Procedure: Lead Revision/Repair;  Surgeon: Will Meredith Leeds, MD;  Location: [facility];  Service: Cardiovascular;  Laterality: N/A;   EP IMPLANTABLE DEVICE N/A 06/25/2016   Procedure: Pacemaker Implant;  Surgeon: Will Meredith Leeds, MD;  Location: [facility];  Service: Cardiovascular;  Laterality: N/A;   EXCISIONAL HEMORRHOIDECTOMY  02/20/1969   EYE SURGERY Left 06/22/1998   "branch vein occlusion"   EYE SURGERY Left ~ 2001   "smoothed out wrinkle"   LEFT OOPHORECTOMY  05/23/1979   nicked bowel, peritonitis, colostomy; colostomy reversed 1981    LOWER EXTREMITY ANGIOGRAM N/A 01/03/2014   Procedure: LOWER EXTREMITY ANGIOGRAM;  Surgeon: Wellington Hampshire, MD;  Location: [facility];  Service: Cardiovascular;  Laterality: N/A;   Nuclear med stress test  10/21/2011   Small area of mild ischemia inferoapically.   PARTIAL HYSTERECTOMY  02/20/1969   left ovaries, then ovaries removed later due tumors    POLYPECTOMY  06/02/2021   Procedure: POLYPECTOMY;  Surgeon: Eloise Harman, DO;  [facility];  Service: Endoscopy;;   right eye surgery  03/2021   RIGHT OOPHORECTOMY  02/20/1969    OB History     Gravida  3   Para  2   Term      Preterm      AB  1   Living  2      SAB  1   IAB      Ectopic      Multiple      Live Births  2            Home Medications    Prior to Admission medications   Medication Sig Start Date End Date Taking? Authorizing Provider  acetaminophen (TYLENOL) 325 MG tablet Take 650 mg by mouth every 6 (six) hours as needed for headache.    [provider]  amiodarone (PACERONE) 200 MG tablet Take 0.5  tablets (100 mg total) by mouth daily. 08/15/19   Camnitz, Ocie Doyne, MD  apixaban (ELIQUIS) 5 MG TABS tablet Take 1 tablet (5 mg total) by mouth 2 (two) times daily. 01/08/22   Satira Sark, MD  atorvastatin (LIPITOR) 80 MG tablet TAKE ONE TABLET BY MOUTH EVERY DAY AT 6:00PM 10/06/18   Herminio Commons, MD  carvedilol (COREG) 25 MG tablet Take 0.5 tablets (12.5 mg total) by mouth 2 (two) times daily. 01/08/22   Satira Sark, MD  Cholecalciferol (VITAMIN D3 PO) Take 2,000 Units by mouth every evening.    [provider]  clopidogrel (PLAVIX) 75 MG tablet TAKE ONE  TABLET ('75MG'$  TOTAL) BY MOUTH DAILY 03/20/19   Herminio Commons, MD  Continuous Blood Gluc Receiver (FREESTYLE LIBRE 2 READER) DEVI 1 each by Does not apply route daily. 06/24/20   Philemon Kingdom, MD  Continuous Blood Gluc Sensor (FREESTYLE LIBRE 2 SENSOR) MISC USE ONE FOR FOURTEEN DAYS 05/19/21   Philemon Kingdom, MD  glucose blood (FREESTYLE PRECISION NEO TEST) test strip Use as instructed 05/23/21   Philemon Kingdom, MD  Insulin NPH, Human,, Isophane, (NOVOLIN N FLEXPEN) 100 UNIT/ML Kiwkpen Inject 25 units in am and 15 units before dinner 12/22/21   Philemon Kingdom, MD  insulin NPH-regular Human (NOVOLIN 70/30) (70-30) 100 UNIT/ML injection INJECT 35 UNITS BEFORE BREAKFAST AND 35 UNITS BEFORE SUPPER 12/18/21   Philemon Kingdom, MD  levothyroxine (SYNTHROID) 112 MCG tablet TAKE ONE TABLET BY MOUTH ONCE DAILY 01/07/22   Philemon Kingdom, MD  nitroGLYCERIN (NITROSTAT) 0.4 MG SL tablet Place 1 tablet (0.4 mg total) under the tongue every 5 (five) minutes as needed for chest pain. 11/25/17   Rolland Porter, MD  rOPINIRole (REQUIP) 0.5 MG tablet Take 0.5 mg by mouth at bedtime.  03/05/14   [provider]  Semaglutide, 2 MG/DOSE, 8 MG/3ML SOPN Inject 2 mg as directed once a week. 06/06/21   Philemon Kingdom, MD    Family History Family History  Problem Relation Age of Onset   Heart disease Mother         deceased   Heart disease Father        deceased, heart disease   Diabetes Brother    Heart disease Brother    Thyroid disease Brother    Heart disease Sister    Heart disease Brother    Thyroid disease Brother    Lupus Daughter    Colon cancer Neg Hx     Social History Social History   Tobacco Use   Smoking status: Never    Passive exposure: Never   Smokeless tobacco: Never   Tobacco comments:    Never smoked  Vaping Use   Vaping Use: Never used  Substance Use Topics   Alcohol use: No    Alcohol/week: 0.0 standard drinks of alcohol   Drug use: No     Allergies   Penicillins and Percocet [oxycodone-acetaminophen]   Review of Systems Review of Systems Per HPI  Physical Exam Triage Vital Signs ED Triage Vitals [01/10/22 1159]  Enc Vitals Group     BP 138/76     Pulse Rate 80     Resp 18     Temp 98 F (36.7 C)     Temp Source Oral     SpO2 97 %     Weight      Height      Head Circumference      Peak Flow      Pain Score 8     Pain Loc      Pain Edu?      Excl. in [place]?    No data found.  Updated Vital Signs BP 138/76 (BP Location: Right Arm)   Pulse 80   Temp 98 F (36.7 C) (Oral)   Resp 18   SpO2 97%   Visual Acuity Right Eye Distance:   Left Eye Distance:   Bilateral Distance:    Right Eye Near:   Left Eye Near:    Bilateral Near:     Physical Exam Vitals and nursing note reviewed.  Constitutional:      General: She is not in  acute distress.    Appearance: Normal appearance.  HENT:     Head: Normocephalic.  Eyes:     Extraocular Movements: Extraocular movements intact.     Pupils: Pupils are equal, round, and reactive to light.  Pulmonary:     Effort: Pulmonary effort is normal.  Musculoskeletal:        General: Swelling, tenderness and signs of injury present.     Left lower leg: Edema present.     Left foot: Decreased range of motion. Swelling, deformity and tenderness present.     Comments: Tenderness to the lateral aspect  of the left foot to the left fifth metatarsal.  There is bruising, swelling, and ecchymosis.  Decreased range of motion is also present.  Skin:    General: Skin is warm and dry.  Neurological:     General: No focal deficit present.     Mental Status: She is alert and oriented to person, place, and time.  Psychiatric:        Mood and Affect: Mood normal.        Behavior: Behavior normal.      UC Treatments / Results  Labs (all labs ordered are listed, but only abnormal results are displayed) Labs Reviewed - No data to display  EKG   Radiology DG Foot Complete Left  Result Date: 01/10/2022 CLINICAL DATA:  Fall. Pain. Lateral foot pain. Patient reports fracture left foot several years ago. EXAM: LEFT FOOT - COMPLETE 3+ VIEW COMPARISON:  None Available. FINDINGS: There is diffuse decreased bone mineralization. There is oblique linear lucency within the distal shaft of fifth metatarsal that appears to represent an acute fracture. There is up to 2 mm lateral displacement of the distal fracture component with respect to the proximal fracture component. Severe second and third and otherwise moderate first, fourth, and fifth tarsometatarsal joint space narrowing. Second and third tarsometatarsal high-grade subchondral sclerosis degenerative change. Moderate navicular-medial cuneiform joint space narrowing. Moderate dorsal tarsometatarsal degenerative osteophytes on lateral view. Small plantar calcaneal heel spur. Moderate vascular calcifications. IMPRESSION: 1. Acute mildly displaced fracture of the distal shaft of fifth metatarsal. 2. Severe second and third tarsometatarsal osteoarthritis. Electronically Signed   By: Yvonne Kendall M.D.   On: 01/10/2022 12:14    Procedures Procedures (including critical care time)  Medications Ordered in UC Medications - No data to display  Initial Impression / Assessment and Plan / UC Course  I have reviewed the triage vital signs and the nursing  notes.  Pertinent labs & imaging results that were available during my care of the patient were reviewed by me and considered in my medical decision making (see chart for details).  Patient presents for complaints of left foot pain after fall earlier this morning.  On exam, she has swelling decreased range of motion and deformity to the left foot along the fifth metatarsal.  X-ray confirms a mildly displaced fracture of the distal shaft of the fifth metatarsal.  She also is showing osteoarthritis in her second and third tarsometatarsal's.  Patient reports that she does already have a cam boot at home.  She states that she will use the one that she has currently.  Patient was advised that she should follow-up with orthopedics within the next 48 to 72 hours for further evaluation.  Supportive care recommendations were provided to the patient.  Would like to continue Tylenol she is currently taking.  Patient will be provided information for [facility] and for [facility] in [place].  Follow-up as needed. Final Clinical Impressions(s) / UC Diagnoses   Final diagnoses:  Closed displaced fracture of fifth metatarsal bone of left foot, initial encounter     Discharge Instructions      Your x-rays show you have fractured the fifth metatarsal of your left foot. Apply cam boot to the left lower extremity as soon as you get home.  Wear the cam boot with any ambulation or when you are up and moving. May take over-the-counter ibuprofen or Tylenol for pain or discomfort. RICE therapy rest, ice, compression, and elevation until your symptoms improve.  Apply ice for 20 minutes, remove for 1 hour, then repeat as often as possible.  This will help with pain and swelling. Follow-up with orthopedics within the next 48 to 72 hours.  You can follow-up with [facility] at [phone] or [facility] in [place] at [phone].      ED Prescriptions   None    PDMP not reviewed  this encounter.   Tish Men, NP 01/10/22 1224

## 2022-01-12 ENCOUNTER — Ambulatory Visit (INDEPENDENT_AMBULATORY_CARE_PROVIDER_SITE_OTHER): Payer: PPO | Admitting: Ophthalmology

## 2022-01-12 ENCOUNTER — Encounter (INDEPENDENT_AMBULATORY_CARE_PROVIDER_SITE_OTHER): Payer: Self-pay | Admitting: Ophthalmology

## 2022-01-12 DIAGNOSIS — H34831 Tributary (branch) retinal vein occlusion, right eye, with macular edema: Secondary | ICD-10-CM

## 2022-01-12 MED ORDER — AFLIBERCEPT 2MG/0.05ML IZ SOLN FOR KALEIDOSCOPE
2.0000 mg | INTRAVITREAL | Status: AC | PRN
  Administered 2022-01-12: 2 mg via INTRAVITREAL

## 2022-01-12 NOTE — Progress Notes (Signed)
01/12/2022     CHIEF COMPLAINT Patient presents for  Chief Complaint  Patient presents with   Retina Evaluation      HISTORY OF PRESENT ILLNESS: Virginia Huber is a 76 y.o. female who presents to the clinic today for:   HPI     Retina Evaluation           Laterality: right eye         Comments   5 weeks dilate OD EYLEA OCT for hx of BRVO, TODAY AT 5 weeks post therapy Pt states her vision has been stable  Pt denies any new floaters or FOL  Pt states she "broke her foot on Saturday"   Recent accident with left broken ankle or foot      Last edited by Virginia Horn, MD on 01/12/2022 11:15 AM.      Referring physician: Sharilyn Sites, MD [address] [place],  [place] [address]  HISTORICAL INFORMATION:   Selected notes from the [facility]    Lab Results  Component Value Date   HGBA1C 6.7 (A) 10/09/2021     CURRENT MEDICATIONS: No current outpatient medications on file. (Ophthalmic Drugs)   No current facility-administered medications for this visit. (Ophthalmic Drugs)   Current Outpatient Medications (Other)  Medication Sig   acetaminophen (TYLENOL) 325 MG tablet Take 650 mg by mouth every 6 (six) hours as needed for headache.   amiodarone (PACERONE) 200 MG tablet Take 0.5 tablets (100 mg total) by mouth daily.   apixaban (ELIQUIS) 5 MG TABS tablet Take 1 tablet (5 mg total) by mouth 2 (two) times daily.   atorvastatin (LIPITOR) 80 MG tablet TAKE ONE TABLET BY MOUTH EVERY DAY AT 6:00PM   carvedilol (COREG) 25 MG tablet Take 0.5 tablets (12.5 mg total) by mouth 2 (two) times daily.   Cholecalciferol (VITAMIN D3 PO) Take 2,000 Units by mouth every evening.   clopidogrel (PLAVIX) 75 MG tablet TAKE ONE TABLET ('75MG'$  TOTAL) BY MOUTH DAILY   Continuous Blood Gluc Receiver (FREESTYLE LIBRE 2 READER) DEVI 1 each by Does not apply route daily.   Continuous Blood Gluc Sensor (FREESTYLE LIBRE 2 SENSOR) MISC USE ONE FOR FOURTEEN DAYS   glucose  blood (FREESTYLE PRECISION NEO TEST) test strip Use as instructed   Insulin NPH, Human,, Isophane, (NOVOLIN N FLEXPEN) 100 UNIT/ML Kiwkpen Inject 25 units in am and 15 units before dinner   insulin NPH-regular Human (NOVOLIN 70/30) (70-30) 100 UNIT/ML injection INJECT 35 UNITS BEFORE BREAKFAST AND 35 UNITS BEFORE SUPPER   levothyroxine (SYNTHROID) 112 MCG tablet TAKE ONE TABLET BY MOUTH ONCE DAILY   nitroGLYCERIN (NITROSTAT) 0.4 MG SL tablet Place 1 tablet (0.4 mg total) under the tongue every 5 (five) minutes as needed for chest pain.   rOPINIRole (REQUIP) 0.5 MG tablet Take 0.5 mg by mouth at bedtime.    Semaglutide, 2 MG/DOSE, 8 MG/3ML SOPN Inject 2 mg as directed once a week.   No current facility-administered medications for this visit. (Other)      REVIEW OF SYSTEMS: ROS   Positive for: Musculoskeletal Negative for: Constitutional, Gastrointestinal, Neurological, Skin, Genitourinary, HENT, Endocrine, Cardiovascular, Eyes, Respiratory, Psychiatric, Allergic/Imm, Heme/Lymph Last edited by Virginia Horn, MD on 01/12/2022 11:15 AM.       ALLERGIES Allergies  Allergen Reactions   Penicillins Hives    Has patient had a PCN reaction causing immediate rash, facial/tongue/throat swelling, SOB or lightheadedness with hypotension: Yes Has patient had a PCN reaction causing severe rash  involving mucus membranes or skin necrosis: No Has patient had a PCN reaction that required hospitalization No Has patient had a PCN reaction occurring within the last 10 years: No If all of the above answers are "NO", then may proceed with Cephalosporin use.   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 h/o PAF/flutter during admission in 2013 for PNA. b. PAF during adm for NSTEMI 07/2015, subsequent paroxysms since then.   B12 deficiency anemia    Branch retinal vein occlusion with macular edema of right eye 10/11/2019   Chronic renal failure, stage 3b ([facility]) 09/23/2017   Coronary artery disease 11/30/2014   a. remote MI. b. h/o PTCA with scoring balloon to OM1 11/2014. c. NSTEMI 03/2015 s/p DES to prox-mid Cx. d. NSTEMI 07/2015 s/p scoring balloon/PTCA/DES to dRCA with PAF during that admission   Cutaneous lupus erythematosus    Early stage nonexudative age-related macular degeneration of right eye 02/27/2020   Essential hypertension    GERD (gastroesophageal reflux disease)    History of blood transfusion 1980's   2nd surgical procedures   Hypercholesteremia    Hypothyroidism    Myocardial infarction ([facility]) 02/2012   NSTEMI (non-ST elevated myocardial infarction) ([facility]) 04/02/2015   OSA (obstructive sleep apnea) 05/13/2016   Ovarian tumor    PAD (peripheral artery disease) ([facility])    a. s/p LE angio 2015; followed by Dr. Fletcher Anon - managed medically.   Pericardial effusion    a. 06/2016 after ppm - s/p pericardiocentesis.   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 s/p Medtronic PPM 06/2016, c/b lead perf/pericardial effusion.   TIA (transient ischemic attack)    Type 2 diabetes with nephropathy ([facility]) 02/29/2012   Past Surgical History:  Procedure Laterality Date   ABDOMINAL AORTAGRAM N/A 01/03/2014   Procedure: ABDOMINAL [name];  Surgeon: Wellington Hampshire, MD;  Location: [facility];   Service: Cardiovascular;  Laterality: N/A;   ABDOMINAL HYSTERECTOMY  06/22/1970   "partial"   APPENDECTOMY  02/20/1969   CARDIAC CATHETERIZATION  06/22/2006   Tiny OM-2 with 90% narrowing. Med tx.   CARDIAC CATHETERIZATION N/A 11/30/2014   Procedure: Left Heart Cath and Coronary Angiography;  Surgeon: Troy Sine, MD; LAD 20%, CFX 50%, OM1 95%, right PLB 30%, LV normal    CARDIAC CATHETERIZATION N/A 11/30/2014   Procedure: Coronary Balloon Angioplasty;  Surgeon: Troy Sine, MD;  Angiosculpt scoring balloon and PTCA to the OM1 reducing stenosis from 95% to less than 10%   CARDIAC CATHETERIZATION N/A 04/03/2015   Procedure: Left Heart Cath and Coronary Angiography;  Surgeon: Jolaine Artist, MD; dLAD 50%, CFX 90%, OM1 100%, PLA 15%, LVEDP 13     CARDIAC CATHETERIZATION N/A 04/03/2015   Procedure: Coronary Stent Intervention;  Surgeon: Sherren Mocha, MD; 3.0x18 mm Xience DES to the CFX  CARDIAC CATHETERIZATION N/A 08/02/2015   Procedure: Left Heart Cath and Coronary Angiography;  Surgeon: Troy Sine, MD;  Location: [facility];  Service: Cardiovascular;  Laterality: N/A;   CARDIAC CATHETERIZATION N/A 08/02/2015   Procedure: Coronary Stent Intervention;  Surgeon: Troy Sine, MD;  Location: [facility];  Service: Cardiovascular;  Laterality: N/A;   CARDIAC CATHETERIZATION N/A 06/25/2016   Procedure: Pericardiocentesis;  Surgeon: Will Meredith Leeds, MD;  Location: [facility];  Service: Cardiovascular;  Laterality: N/A;   cardiac stents     CARDIOVERSION N/A 12/15/2017   Procedure: CARDIOVERSION;  Surgeon: Acie Fredrickson Wonda Cheng, MD;  Location: [facility];  Service: Cardiovascular;  Laterality: N/A;   CHOLECYSTECTOMY OPEN  02/20/1989   COLONOSCOPY  06/23/2003   Dr. Laural Golden: pancolonic divericula, polyp, path unknown currently   COLONOSCOPY  06/22/2010   Dr. Oneida Alar: Normal TI, scattered diverticula in entire colon, small internal hemorrhoids, normal colon  biopsies. Colonoscopy in 5-10 years.    COLONOSCOPY WITH PROPOFOL N/A 06/02/2021   pancolonic diverticulosis. Two 4-6 mm polyps in transverse colon. Sessile serrated and hyperplastic. 5 year surveillance if benefits outweigh the risks.   COLOSTOMY  05/23/1979   COLOSTOMY REVERSAL  11/21/1979   EP IMPLANTABLE DEVICE N/A 06/25/2016   Procedure: Lead Revision/Repair;  Surgeon: Will Meredith Leeds, MD;  Location: [facility];  Service: Cardiovascular;  Laterality: N/A;   EP IMPLANTABLE DEVICE N/A 06/25/2016   Procedure: Pacemaker Implant;  Surgeon: Will Meredith Leeds, MD;  Location: [facility];  Service: Cardiovascular;  Laterality: N/A;   EXCISIONAL HEMORRHOIDECTOMY  02/20/1969   EYE SURGERY Left 06/22/1998   "branch vein occlusion"   EYE SURGERY Left ~ 2001   "smoothed out wrinkle"   LEFT OOPHORECTOMY  05/23/1979   nicked bowel, peritonitis, colostomy; colostomy reversed 1981    LOWER EXTREMITY ANGIOGRAM N/A 01/03/2014   Procedure: LOWER EXTREMITY ANGIOGRAM;  Surgeon: Wellington Hampshire, MD;  Location: [facility];  Service: Cardiovascular;  Laterality: N/A;   Nuclear med stress test  10/21/2011   Small area of mild ischemia inferoapically.   PARTIAL HYSTERECTOMY  02/20/1969   left ovaries, then ovaries removed later due tumors    POLYPECTOMY  06/02/2021   Procedure: POLYPECTOMY;  Surgeon: Eloise Harman, DO;  [facility];  Service: Endoscopy;;   right eye surgery  03/2021   RIGHT OOPHORECTOMY  02/20/1969    FAMILY HISTORY Family History  Problem Relation Age of Onset   Heart disease Mother        deceased   Heart disease Father        deceased, heart disease   Diabetes Brother    Heart disease Brother    Thyroid disease Brother    Heart disease Sister    Heart disease Brother    Thyroid disease Brother    Lupus Daughter    Colon cancer Neg Hx     SOCIAL HISTORY Social History   Tobacco Use   Smoking status: Never    Passive exposure: Never    Smokeless tobacco: Never   Tobacco comments:    Never smoked  Vaping Use   Vaping Use: Never used  Substance Use Topics   Alcohol use: No    Alcohol/week: 0.0 standard drinks of alcohol   Drug use: No         OPHTHALMIC EXAM:  Base Eye Exam     Visual Acuity (ETDRS)       Right Left  Dist [place] 20/50 CF at 3'         Tonometry (Tonopen, 10:47 AM)       Right Left   Pressure 13 13         Pupils       Shape   Right Round   Left          Neuro/Psych     Oriented x3: Yes   Mood/Affect: Normal         Dilation     Right eye: 1.0% Mydriacyl, 2.5% Phenylephrine @ 10:45 AM           Slit Lamp and Fundus Exam     External Exam       Right Left   External Normal Normal         Slit Lamp Exam       Right Left   Lids/Lashes Normal Normal   Conjunctiva/Sclera White and quiet White and quiet   Cornea Clear Clear   Anterior Chamber Deep and quiet Deep and quiet   Iris Round and reactive Round and reactive   Lens Posterior chamber intraocular lens Posterior chamber intraocular lens   Anterior Vitreous Normal, Normal         Fundus Exam       Right Left   Posterior Vitreous Clear and avitric, no debris visible    Disc Normal    C/D Ratio 0.2    Macula Hard drusen, , Microaneurysms, Retinal pigment epithelial mottling, no hemorrhage, no exudates, no cystoid macular edema, no macular thickening    Vessels Old branch retinal vein occlusion    Periphery Normal,  , no retinal hole, no retinal tear, good peripheral PRP sector             IMAGING AND PROCEDURES  Imaging and Procedures for 01/12/22  OCT, Retina - OU - Both Eyes       Right Eye Quality was good. Scan locations included subfoveal. Central Foveal Thickness: 315. Progression has improved. Findings include abnormal foveal contour.   Left Eye Quality was good. Scan locations included subfoveal. Central Foveal Thickness: 223. Progression has been stable. Findings include  disciform scar, subretinal scarring.   Notes Much improved and vastly improved CME from BRVO, with underlying diabetic retinopathy post vitrectomy for floaters, with slight recurrence may be associated with evanescent blood pressure issues currently.  Now maintained on 5-week injection post Eylea.  We will repeat injection today and maintain 5-week follow-up OD  OS, no active maculopathy, with diffuse atrophy, cicatricial scarring Accounts for acuity     Intravitreal Injection, Pharmacologic Agent - OD - Right Eye       Time Out 01/12/2022. 11:20 AM. Confirmed correct patient, procedure, site, and patient consented.   Anesthesia Topical anesthesia was used. Anesthetic medications included Lidocaine 4%.   Procedure Preparation included 5% betadine to ocular surface, 10% betadine to eyelids, Tobramycin 0.3%. A 30 gauge needle was used.   Injection: 2 mg aflibercept 2 MG/0.05ML   Route: Intravitreal, Site: Right Eye   NDC: [id number], Lot: [id number], Expiration date: 02/22/2023, Waste: 0 mL   Post-op Post injection exam found visual acuity of at least counting fingers. The patient tolerated the procedure well. There were no complications. The patient received written and verbal post procedure care education. Post injection medications included ocuflox.              ASSESSMENT/PLAN:  Branch retinal vein occlusion with macular edema of right eye OD doing very well with  persistent but still less in the parafoveal from BRVO on use of Eylea.  Repeat injection today and maintain 5 to 6-week evaluation     ICD-10-CM   1. Branch retinal vein occlusion with macular edema of right eye  H34.8310 OCT, Retina - OU - Both Eyes    Intravitreal Injection, Pharmacologic Agent - OD - Right Eye    aflibercept (EYLEA) SOLN 2 mg      1.  Much improved perifoveal CME but still active secondary to BRVO macula.  Repeat injection Eylea today reevaluate again in 5 weeks  2.  3.  Ophthalmic  Meds Ordered this visit:  Meds ordered this encounter  Medications   aflibercept (EYLEA) SOLN 2 mg       Return in about 5 weeks (around 02/16/2022) for dilate, OD, EYLEA OCT.  There are no Patient Instructions on file for this visit.   Explained the diagnoses, plan, and follow up with the patient and they expressed understanding.  Patient expressed understanding of the importance of proper follow up care.   Clent Demark Garet Hooton M.D. Diseases & Surgery of the Retina and Vitreous [facility] 01/12/22     Abbreviations: M myopia (nearsighted); A astigmatism; H hyperopia (farsighted); P presbyopia; Mrx spectacle prescription;  CTL contact lenses; OD right eye; OS left eye; OU both eyes  XT exotropia; ET esotropia; PEK punctate epithelial keratitis; PEE punctate epithelial erosions; DES dry eye syndrome; MGD meibomian gland dysfunction; ATs artificial tears; PFAT's preservative free artificial tears; [place] nuclear sclerotic cataract; PSC posterior subcapsular cataract; ERM epi-retinal membrane; PVD posterior vitreous detachment; RD retinal detachment; DM diabetes mellitus; DR diabetic retinopathy; NPDR non-proliferative diabetic retinopathy; PDR proliferative diabetic retinopathy; CSME clinically significant macular edema; DME diabetic macular edema; dbh dot blot hemorrhages; CWS cotton wool spot; POAG primary open angle glaucoma; C/D cup-to-disc ratio; HVF humphrey visual field; GVF goldmann visual field; OCT optical coherence tomography; IOP intraocular pressure; BRVO Branch retinal vein occlusion; CRVO central retinal vein occlusion; CRAO central retinal artery occlusion; BRAO branch retinal artery occlusion; RT retinal tear; SB scleral buckle; PPV pars plana vitrectomy; VH Vitreous hemorrhage; PRP panretinal laser photocoagulation; IVK intravitreal kenalog; VMT vitreomacular traction; MH Macular hole;  NVD neovascularization of the disc; NVE neovascularization elsewhere; AREDS age  related eye disease study; ARMD age related macular degeneration; POAG primary open angle glaucoma; EBMD epithelial/anterior basement membrane dystrophy; ACIOL anterior chamber intraocular lens; IOL intraocular lens; PCIOL posterior chamber intraocular lens; Phaco/IOL phacoemulsification with intraocular lens placement; [place] photorefractive keratectomy; LASIK laser assisted in situ keratomileusis; HTN hypertension; DM diabetes mellitus; COPD chronic obstructive pulmonary disease

## 2022-01-12 NOTE — Assessment & Plan Note (Signed)
OD doing very well with persistent but still less in the parafoveal from BRVO on use of Eylea.  Repeat injection today and maintain 5 to 6-week evaluation

## 2022-01-13 ENCOUNTER — Encounter: Payer: Self-pay | Admitting: Physician Assistant

## 2022-01-13 ENCOUNTER — Ambulatory Visit (INDEPENDENT_AMBULATORY_CARE_PROVIDER_SITE_OTHER): Payer: PPO | Admitting: Physician Assistant

## 2022-01-13 ENCOUNTER — Ambulatory Visit (INDEPENDENT_AMBULATORY_CARE_PROVIDER_SITE_OTHER): Payer: PPO | Admitting: Orthopedic Surgery

## 2022-01-13 VITALS — BP 132/74 | HR 74 | Ht 63.0 in | Wt 138.0 lb

## 2022-01-13 DIAGNOSIS — I10 Essential (primary) hypertension: Secondary | ICD-10-CM

## 2022-01-13 DIAGNOSIS — Z9989 Dependence on other enabling machines and devices: Secondary | ICD-10-CM | POA: Diagnosis not present

## 2022-01-13 DIAGNOSIS — I495 Sick sinus syndrome: Secondary | ICD-10-CM | POA: Diagnosis not present

## 2022-01-13 DIAGNOSIS — I951 Orthostatic hypotension: Secondary | ICD-10-CM | POA: Diagnosis not present

## 2022-01-13 DIAGNOSIS — G4733 Obstructive sleep apnea (adult) (pediatric): Secondary | ICD-10-CM

## 2022-01-13 DIAGNOSIS — I480 Paroxysmal atrial fibrillation: Secondary | ICD-10-CM | POA: Diagnosis not present

## 2022-01-13 DIAGNOSIS — S92355A Nondisplaced fracture of fifth metatarsal bone, left foot, initial encounter for closed fracture: Secondary | ICD-10-CM | POA: Diagnosis not present

## 2022-01-13 DIAGNOSIS — I2510 Atherosclerotic heart disease of native coronary artery without angina pectoris: Secondary | ICD-10-CM

## 2022-01-13 MED ORDER — CARVEDILOL 6.25 MG PO TABS: 6.2500 mg | ORAL_TABLET | Freq: Two times a day (BID) | ORAL | 3 refills | Status: DC

## 2022-01-13 NOTE — Patient Instructions (Signed)
Medication Instructions:  Your physician has recommended you make the following change in your medication:   DECREASE: Carvedilol to 6.'25mg'$  twice daily  *If you need a refill on your cardiac medications before your next appointment, please call your pharmacy*   Lab Work: None  If you have labs (blood work) drawn today and your tests are completely normal, you will receive your results only by: [facility] (if you have MyChart) OR A paper copy in the mail If you have any lab test that is abnormal or we need to change your treatment, we will call you to review the results.   Follow-Up: At [facility], you and your health needs are our priority.  As part of our continuing mission to provide you with exceptional heart care, we have created designated Provider Care Teams.  These Care Teams include your primary Cardiologist (physician) and Advanced Practice Providers (APPs -  Physician Assistants and Nurse Practitioners) who all work together to provide you with the care you need, when you need it.   Your next appointment:   As scheduled

## 2022-01-14 ENCOUNTER — Encounter: Payer: Self-pay | Admitting: Orthopedic Surgery

## 2022-01-14 NOTE — Progress Notes (Signed)
Office Visit Note   Patient: Virginia Huber           Date of Birth: 22-Jan-1946           MRN: [id number] Visit Date: 01/13/2022              Requested by: Sharilyn Sites, [address],  [place] [address] PCP: Sharilyn Sites, MD  Chief Complaint  Patient presents with   Left Foot - Fracture    Fx of 5th MT      HPI: Patient is a 76 year old woman who is seen for initial evaluation for an acute fifth metatarsal neck fracture left foot secondary to a fall.  Patient is currently in a cam boot.  Assessment & Plan: Visit Diagnoses:  1. Nondisplaced fracture of fifth metatarsal bone, right foot, initial encounter for closed fracture     Plan: Patient is having difficulty with the weight of the cam boot she was given a postoperative shoe she may be weightbearing as tolerated recommended using a walker for balance at follow-up in 4 weeks obtain three-view radiographs of the left foot.  Follow-Up Instructions: Return in about 4 weeks (around 02/10/2022).   Ortho Exam  Patient is alert, oriented, no adenopathy, well-dressed, normal affect, normal respiratory effort. Examination patient has a good dorsalis pedis pulse she has ecchymosis and swelling over the lateral aspect of the left foot there is no open fractures.  Review of the radiographs shows a nondisplaced fifth metatarsal neck fracture on the left with calcified arteries to the metatarsal heads.  Imaging: No results found. No images are attached to the encounter.  Labs: Lab Results  Component Value Date   HGBA1C 6.7 (A) 10/09/2021   HGBA1C 7.5 (A) 06/06/2021   HGBA1C 7.7 (A) 02/04/2021   REPTSTATUS 07/10/2019 FINAL 07/08/2019   GRAMSTAIN  03/01/2012    MODERATE WBC PRESENT,BOTH PMN AND MONONUCLEAR RARE SQUAMOUS EPITHELIAL CELLS PRESENT FEW GRAM POSITIVE COCCI IN PAIRS FEW GRAM NEGATIVE RODS   CULT (A) 07/08/2019    <10,000 COLONIES/mL INSIGNIFICANT GROWTH Performed at [facility], [address] [place], [place] [address]    LABORGA NO GROWTH 06/12/2013     Lab Results  Component Value Date   ALBUMIN 4.2 08/25/2021   ALBUMIN 4.4 02/25/2021   ALBUMIN 4.4 08/22/2020    Lab Results  Component Value Date   MG 1.9 03/13/2016   MG 1.6 (L) 09/18/2015   MG 1.9 08/19/2015   Lab Results  Component Value Date   VD25OH 22 (L) 08/05/2017    No results found for: "PREALBUMIN"    Latest Ref Rng & Units 11/05/2021    9:17 AM 05/29/2021   11:47 AM 08/22/2020    4:47 PM  CBC EXTENDED  WBC 3.4 - 10.8 x10E3/uL 10.5  9.1  8.5   RBC 3.77 - 5.28 x10E6/uL 4.50  4.23  4.06   Hemoglobin 11.1 - 15.9 g/dL 13.9  13.3  12.4   HCT 34.0 - 46.6 % 42.2  41.1  37.7   Platelets 150 - 450 x10E3/uL 295  265  265   NEUT# 1.7 - 7.7 K/uL  7.1    Lymph# 0.7 - 4.0 K/uL  1.2       There is no height or weight on file to calculate BMI.  Orders:  No orders of the defined types were placed in this encounter.  No orders of the defined types were placed in this encounter.    Procedures: No  procedures performed  Clinical Data: No additional findings.  ROS:  All other systems negative, except as noted in the HPI. Review of Systems  Objective: Vital Signs: There were no vitals taken for this visit.  Specialty Comments:  No specialty comments available.  PMFS History: Patient Active Problem List   Diagnosis Date Noted   Stable hemispheric branch retinal vein occlusion (BRVO) of left eye 12/08/2021   History of vitrectomy 12/08/2021   History of colonic polyps 05/13/2021   Vitreous membranes and strands, right 01/21/2021   Advanced nonexudative age-related macular degeneration of left eye with subfoveal involvement 06/26/2020   Stable central retinal vein occlusion of left eye 05/13/2020   Early stage nonexudative age-related macular degeneration of right eye 02/27/2020   OSA on CPAP 02/12/2020   Branch retinal vein occlusion with macular edema of right eye 10/11/2019   Severe  nonproliferative diabetic retinopathy of right eye, with macular edema, associated with type 2 diabetes mellitus ([place]) 10/11/2019   Right retinal defect 10/11/2019   Retinal microaneurysm of right eye 10/11/2019   CHF exacerbation ([place]) 08/25/2019   Acute exacerbation of CHF (congestive heart failure) ([place]) 08/24/2019   Acute on chronic diastolic HF (heart failure) ([place]) 12/17/2017   GERD (gastroesophageal reflux disease) 12/17/2017   Typical atrial flutter (HCC)    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   Paroxysmal atrial fibrillation ([place]) 12/08/2017   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   Chronic renal failure, stage 3b ([place]) 09/23/2017   Fever 09/23/2017   S/P pericardiocentesis 06/28/2016   Acute blood loss anemia 06/28/2016   Pericardial effusion 06/26/2016   Tachy-brady syndrome ([place]) 06/25/2016   Tamponade    Bradycardia 06/14/2016   Junctional bradycardia    Coronary artery disease involving coronary bypass graft of native heart with unstable angina pectoris (HCC)    Acute diastolic CHF (congestive heart failure), NYHA class 3 (HCC)    Systolic congestive heart failure ([place]) 05/13/2016   Multiple and bilateral precerebral artery syndromes 05/13/2016   OSA (obstructive sleep apnea) 05/13/2016   Chronic diarrhea 12/25/2015   Chest pain 08/02/2015   Atrial fibrillation with rapid ventricular response (HCC)    Pain with urination 05/08/2015   NSTEMI (non-ST elevated myocardial infarction) ([place]) 04/02/2015   CAD in native artery 11/30/2014   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    PAD (peripheral artery disease) ([place]) 12/26/2013   Superficial fungus infection of skin 06/29/2013   UTI (urinary tract infection) 05/08/2013   Hypokalemia 03/05/2012   B12 deficiency anemia 03/02/2012   Bronchospasm 03/02/2012   Community acquired bacterial pneumonia 03/01/2012   Acute respiratory  failure with hypoxia ([place]) 03/01/2012   Type 2 diabetes with nephropathy ([place]) 02/29/2012   Hypothyroidism 02/28/2012   RLQ abdominal pain 11/24/2010   OVERWEIGHT/OBESITY 06/03/2010   Essential hypertension 06/03/2010   Overweight 06/03/2010   Mixed hyperlipidemia 12/27/2009   Palpitations 05/17/2009   FRACTURE, TOE 12/06/2007   Past Medical History:  Diagnosis Date   (HFpEF) heart failure with preserved ejection fraction ([place])    Advanced nonexudative age-related macular degeneration of left eye with subfoveal involvement 06/26/2020   Ongoing, accounts for acuity   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 h/o PAF/flutter during admission in 2013 for PNA. b. PAF during adm for NSTEMI 07/2015, subsequent paroxysms since then.   B12 deficiency anemia    Branch retinal  vein occlusion with macular edema of right eye 10/11/2019   Chronic renal failure, stage 3b ([place]) 09/23/2017   Coronary artery disease 11/30/2014   a. remote MI. b. h/o PTCA with scoring balloon to OM1 11/2014. c. NSTEMI 03/2015 s/p DES to prox-mid Cx. d. NSTEMI 07/2015 s/p scoring balloon/PTCA/DES to dRCA with PAF during that admission   Cutaneous lupus erythematosus    Early stage nonexudative age-related macular degeneration of right eye 02/27/2020   Essential hypertension    GERD (gastroesophageal reflux disease)    History of blood transfusion 1980's   2nd surgical procedures   Hypercholesteremia    Hypothyroidism    Myocardial infarction ([place]) 02/2012   NSTEMI (non-ST elevated myocardial infarction) ([place]) 04/02/2015   OSA (obstructive sleep apnea) 05/13/2016   Ovarian tumor    PAD (peripheral artery disease) ([place])    a. s/p LE angio 2015; followed by Dr. Fletcher Anon - managed medically.   Pericardial effusion    a. 06/2016 after ppm - s/p pericardiocentesis.   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 s/p [employer] PPM 06/2016, c/b lead perf/pericardial effusion.   TIA (transient ischemic attack)    Type 2 diabetes with nephropathy ([place]) 02/29/2012    Family History  Problem Relation Age of Onset   Heart disease Mother        deceased   Heart disease Father        deceased, heart disease   Diabetes Brother    Heart disease Brother    Thyroid disease Brother    Heart disease Sister    Heart disease Brother    Thyroid disease Brother    Lupus Daughter    Colon cancer Neg Hx     Past Surgical History:  Procedure Laterality Date   ABDOMINAL AORTAGRAM N/A 01/03/2014   Procedure: ABDOMINAL [name];  Surgeon: Wellington Hampshire, MD;  Location: [facility];  Service: Cardiovascular;  Laterality: N/A;   ABDOMINAL HYSTERECTOMY  06/22/1970   "partial"   APPENDECTOMY  02/20/1969   CARDIAC CATHETERIZATION  06/22/2006   Tiny OM-2 with 90% narrowing. Med tx.   CARDIAC CATHETERIZATION N/A 11/30/2014   Procedure: Left Heart Cath and Coronary Angiography;  Surgeon: Troy Sine, MD; LAD 20%, CFX 50%, OM1 95%, right PLB 30%, LV normal    CARDIAC CATHETERIZATION N/A 11/30/2014   Procedure: Coronary Balloon Angioplasty;  Surgeon: Troy Sine, MD;  Angiosculpt scoring balloon and PTCA to the OM1 reducing stenosis from 95% to less than 10%   CARDIAC CATHETERIZATION N/A 04/03/2015   Procedure: Left Heart Cath and Coronary Angiography;  Surgeon: Jolaine Artist, MD; dLAD 50%, CFX 90%, OM1 100%, PLA 15%, LVEDP 13     CARDIAC CATHETERIZATION N/A 04/03/2015   Procedure: Coronary Stent Intervention;  Surgeon: Sherren Mocha, MD; 3.0x18 mm Xience DES to the CFX     CARDIAC CATHETERIZATION N/A 08/02/2015   Procedure: Left Heart Cath  and Coronary Angiography;  Surgeon: Troy Sine, MD;  Location: [facility];  Service: Cardiovascular;  Laterality: N/A;   CARDIAC CATHETERIZATION N/A 08/02/2015   Procedure: Coronary Stent Intervention;  Surgeon: Troy Sine, MD;  Location: [facility];  Service: Cardiovascular;  Laterality: N/A;   CARDIAC CATHETERIZATION N/A 06/25/2016   Procedure: Pericardiocentesis;  Surgeon: Will Meredith Leeds, MD;  Location: [facility];  Service: Cardiovascular;  Laterality: N/A;   cardiac stents     CARDIOVERSION N/A 12/15/2017   Procedure: CARDIOVERSION;  Surgeon: Acie Fredrickson Wonda Cheng, MD;  Location: [facility];  Service: Cardiovascular;  Laterality: N/A;   CHOLECYSTECTOMY OPEN  02/20/1989   COLONOSCOPY  06/23/2003   Dr. Laural Golden: pancolonic divericula, polyp, path unknown currently   COLONOSCOPY  06/22/2010   Dr. Oneida Alar: Normal TI, scattered diverticula in entire colon, small internal hemorrhoids, normal colon biopsies. Colonoscopy in 5-10 years.    COLONOSCOPY WITH PROPOFOL N/A 06/02/2021   pancolonic diverticulosis. Two 4-6 mm polyps in transverse colon. Sessile serrated and hyperplastic. 5 year surveillance if benefits outweigh the risks.   COLOSTOMY  05/23/1979   COLOSTOMY REVERSAL  11/21/1979   EP IMPLANTABLE DEVICE N/A 06/25/2016   Procedure: Lead Revision/Repair;  Surgeon: Will Meredith Leeds, MD;  Location: [facility];  Service: Cardiovascular;  Laterality: N/A;   EP IMPLANTABLE DEVICE N/A 06/25/2016   Procedure: Pacemaker Implant;  Surgeon: Will Meredith Leeds, MD;  Location: [facility];  Service: Cardiovascular;  Laterality: N/A;   EXCISIONAL HEMORRHOIDECTOMY  02/20/1969   EYE SURGERY Left 06/22/1998   "branch vein occlusion"   EYE SURGERY Left ~ 2001   "smoothed out wrinkle"   LEFT OOPHORECTOMY  05/23/1979   nicked bowel, peritonitis, colostomy; colostomy reversed 1981    LOWER EXTREMITY ANGIOGRAM N/A 01/03/2014   Procedure: LOWER EXTREMITY  ANGIOGRAM;  Surgeon: Wellington Hampshire, MD;  Location: [facility];  Service: Cardiovascular;  Laterality: N/A;   Nuclear med stress test  10/21/2011   Small area of mild ischemia inferoapically.   PARTIAL HYSTERECTOMY  02/20/1969   left ovaries, then ovaries removed later due tumors    POLYPECTOMY  06/02/2021   Procedure: POLYPECTOMY;  Surgeon: Eloise Harman, DO;  [facility];  Service: Endoscopy;;   right eye surgery  03/2021   RIGHT OOPHORECTOMY  02/20/1969   Social History   Occupational History   Occupation: Retired    Fish farm manager: RETIRED    Comment: Research officer, political party  Tobacco Use   Smoking status: Never    Passive exposure: Never   Smokeless tobacco: Never   Tobacco comments:    Never smoked  Vaping Use   Vaping Use: Never used  Substance and Sexual Activity   Alcohol use: No    Alcohol/week: 0.0 standard drinks of alcohol   Drug use: No   Sexual activity: Never    Birth control/protection: Surgical    Comment: hyst

## 2022-01-16 ENCOUNTER — Encounter: Payer: Self-pay | Admitting: Internal Medicine

## 2022-01-19 DIAGNOSIS — E538 Deficiency of other specified B group vitamins: Secondary | ICD-10-CM | POA: Diagnosis not present

## 2022-01-19 DIAGNOSIS — I5032 Chronic diastolic (congestive) heart failure: Secondary | ICD-10-CM | POA: Diagnosis not present

## 2022-01-19 DIAGNOSIS — I952 Hypotension due to drugs: Secondary | ICD-10-CM | POA: Diagnosis not present

## 2022-01-19 DIAGNOSIS — E663 Overweight: Secondary | ICD-10-CM | POA: Diagnosis not present

## 2022-01-19 DIAGNOSIS — E039 Hypothyroidism, unspecified: Secondary | ICD-10-CM | POA: Diagnosis not present

## 2022-01-19 DIAGNOSIS — Z6825 Body mass index (BMI) 25.0-25.9, adult: Secondary | ICD-10-CM | POA: Diagnosis not present

## 2022-01-19 DIAGNOSIS — I10 Essential (primary) hypertension: Secondary | ICD-10-CM | POA: Diagnosis not present

## 2022-01-19 DIAGNOSIS — I2510 Atherosclerotic heart disease of native coronary artery without angina pectoris: Secondary | ICD-10-CM | POA: Diagnosis not present

## 2022-01-19 DIAGNOSIS — D649 Anemia, unspecified: Secondary | ICD-10-CM | POA: Diagnosis not present

## 2022-01-19 DIAGNOSIS — E782 Mixed hyperlipidemia: Secondary | ICD-10-CM | POA: Diagnosis not present

## 2022-01-22 ENCOUNTER — Ambulatory Visit (INDEPENDENT_AMBULATORY_CARE_PROVIDER_SITE_OTHER): Payer: PPO | Admitting: Orthopedic Surgery

## 2022-01-22 ENCOUNTER — Ambulatory Visit (INDEPENDENT_AMBULATORY_CARE_PROVIDER_SITE_OTHER): Payer: PPO

## 2022-01-22 ENCOUNTER — Encounter: Payer: Self-pay | Admitting: Orthopedic Surgery

## 2022-01-22 DIAGNOSIS — S92355A Nondisplaced fracture of fifth metatarsal bone, left foot, initial encounter for closed fracture: Secondary | ICD-10-CM

## 2022-01-22 DIAGNOSIS — M79672 Pain in left foot: Secondary | ICD-10-CM

## 2022-01-22 NOTE — Progress Notes (Signed)
Office Visit Note   Patient: Virginia Huber           Date of Birth: 1946/01/14           MRN: [id number] Visit Date: 01/22/2022              Requested by: Sharilyn Sites, [place] [address],  [place] [address] PCP: Sharilyn Sites, MD  Chief Complaint  Patient presents with   Left Foot - Follow-up    5th MT fx      HPI: Patient is seen for evaluation with a concern for black toes left foot third and fourth toes.  Patient states she has had difficulty sleeping has had swelling.  And increased pain in the past 2 days.  Assessment & Plan: Visit Diagnoses:  1. Pain in left foot   2. Closed nondisplaced fracture of fifth metatarsal bone of left foot, initial encounter     Plan: Recommend continue using the Ace wrap continue with elevation.  Follow-up as scheduled in 4 weeks with three-view radiographs of the left foot.  Follow-Up Instructions: Return in about 4 weeks (around 02/19/2022).   Ortho Exam  Patient is alert, oriented, no adenopathy, well-dressed, normal affect, normal respiratory effort. Examination patient has a palpable dorsalis pedis pulse she has brisk capillary refill of her toes with capillary refill less than a second.  There is no calf pain or tenderness no evidence of DVT.  No open ulcers no cellulitis.  There is ecchymosis and bruising in the third and fourth toes consistent with a sprain or nondisplaced fracture of the proximal phalanx of the third and fourth toes.  Patient denies any recent trauma.  Imaging: XR Foot Complete Left  Result Date: 01/22/2022 Three-view radiographs of the left foot shows stable alignment of the metatarsal shaft fracture with calcified vessels out to the toes with osteoporosis.  No images are attached to the encounter.  Labs: Lab Results  Component Value Date   HGBA1C 6.7 (A) 10/09/2021   HGBA1C 7.5 (A) 06/06/2021   HGBA1C 7.7 (A) 02/04/2021   REPTSTATUS 07/10/2019 FINAL 07/08/2019   GRAMSTAIN  03/01/2012     MODERATE WBC PRESENT,BOTH PMN AND MONONUCLEAR RARE SQUAMOUS EPITHELIAL CELLS PRESENT FEW GRAM POSITIVE COCCI IN PAIRS FEW GRAM NEGATIVE RODS   CULT (A) 07/08/2019    <10,000 COLONIES/mL INSIGNIFICANT GROWTH Performed at [facility], [address] [place], [place] [address]    LABORGA NO GROWTH 06/12/2013     Lab Results  Component Value Date   ALBUMIN 4.2 08/25/2021   ALBUMIN 4.4 02/25/2021   ALBUMIN 4.4 08/22/2020    Lab Results  Component Value Date   MG 1.9 03/13/2016   MG 1.6 (L) 09/18/2015   MG 1.9 08/19/2015   Lab Results  Component Value Date   VD25OH 22 (L) 08/05/2017    No results found for: "PREALBUMIN"    Latest Ref Rng & Units 11/05/2021    9:17 AM 05/29/2021   11:47 AM 08/22/2020    4:47 PM  CBC EXTENDED  WBC 3.4 - 10.8 x10E3/uL 10.5  9.1  8.5   RBC 3.77 - 5.28 x10E6/uL 4.50  4.23  4.06   Hemoglobin 11.1 - 15.9 g/dL 13.9  13.3  12.4   HCT 34.0 - 46.6 % 42.2  41.1  37.7   Platelets 150 - 450 x10E3/uL 295  265  265   NEUT# 1.7 - 7.7 K/uL  7.1    Lymph# 0.7 - 4.0 K/uL  1.2       There is no height or weight on file to calculate BMI.  Orders:  Orders Placed This Encounter  Procedures   XR Foot Complete Left   No orders of the defined types were placed in this encounter.    Procedures: No procedures performed  Clinical Data: No additional findings.  ROS:  All other systems negative, except as noted in the HPI. Review of Systems  Objective: Vital Signs: There were no vitals taken for this visit.  Specialty Comments:  No specialty comments available.  PMFS History: Patient Active Problem List   Diagnosis Date Noted   Stable hemispheric branch retinal vein occlusion (BRVO) of left eye 12/08/2021   History of vitrectomy 12/08/2021   History of colonic polyps 05/13/2021   Vitreous membranes and strands, right 01/21/2021   Advanced nonexudative age-related macular degeneration of left eye with subfoveal involvement 06/26/2020    Stable central retinal vein occlusion of left eye 05/13/2020   Early stage nonexudative age-related macular degeneration of right eye 02/27/2020   OSA on CPAP 02/12/2020   Branch retinal vein occlusion with macular edema of right eye 10/11/2019   Severe nonproliferative diabetic retinopathy of right eye, with macular edema, associated with type 2 diabetes mellitus ([place]) 10/11/2019   Right retinal defect 10/11/2019   Retinal microaneurysm of right eye 10/11/2019   CHF exacerbation ([place]) 08/25/2019   Acute exacerbation of CHF (congestive heart failure) ([place]) 08/24/2019   Acute on chronic diastolic HF (heart failure) ([place]) 12/17/2017   GERD (gastroesophageal reflux disease) 12/17/2017   Typical atrial flutter (HCC)    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   Paroxysmal atrial fibrillation ([place]) 12/08/2017   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/08/2017   Chronic renal failure, stage 3b ([place]) 09/23/2017   Fever 09/23/2017   S/P pericardiocentesis 06/28/2016   Acute blood loss anemia 06/28/2016   Pericardial effusion 06/26/2016   Tachy-brady syndrome ([facility]) 06/25/2016   Tamponade    Bradycardia 06/14/2016   Junctional bradycardia    Coronary artery disease involving coronary bypass graft of native heart with unstable angina pectoris (HCC)    Acute diastolic CHF (congestive heart failure), NYHA class 3 (HCC)    Systolic congestive heart failure ([place]) 05/13/2016   Multiple and bilateral precerebral artery syndromes 05/13/2016   OSA (obstructive sleep apnea) 05/13/2016   Chronic diarrhea 12/25/2015   Chest pain 08/02/2015   Atrial fibrillation with rapid ventricular response (HCC)    Pain with urination 05/08/2015   NSTEMI (non-ST elevated myocardial infarction) ([place]) 04/02/2015   CAD in native artery 11/30/2014   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    PAD (peripheral artery disease) ([place]) 12/26/2013    Superficial fungus infection of skin 06/29/2013   UTI (urinary tract infection) 05/08/2013   Hypokalemia 03/05/2012   B12 deficiency anemia 03/02/2012   Bronchospasm 03/02/2012   Community acquired bacterial pneumonia 03/01/2012   Acute respiratory failure with hypoxia ([place]) 03/01/2012   Type 2 diabetes with nephropathy ([place]) 02/29/2012   Hypothyroidism 02/28/2012   RLQ abdominal pain 11/24/2010   OVERWEIGHT/OBESITY 06/03/2010   Essential hypertension 06/03/2010   Overweight 06/03/2010   Mixed hyperlipidemia 12/27/2009   Palpitations 05/17/2009   FRACTURE, TOE 12/06/2007   Past Medical History:  Diagnosis Date   (HFpEF) heart failure with preserved ejection fraction ([place])    Advanced nonexudative age-related macular degeneration of left eye with subfoveal involvement 06/26/2020   Ongoing, accounts for acuity  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 h/o PAF/flutter during admission in 2013 for PNA. b. PAF during adm for NSTEMI 07/2015, subsequent paroxysms since then.   B12 deficiency anemia    Branch retinal vein occlusion with macular edema of right eye 10/11/2019   Chronic renal failure, stage 3b ([place]) 09/23/2017   Coronary artery disease 11/30/2014   a. remote MI. b. h/o PTCA with scoring balloon to OM1 11/2014. c. NSTEMI 03/2015 s/p DES to prox-mid Cx. d. NSTEMI 07/2015 s/p scoring balloon/PTCA/DES to dRCA with PAF during that admission   Cutaneous lupus erythematosus    Early stage nonexudative age-related macular degeneration of right eye 02/27/2020   Essential hypertension    GERD (gastroesophageal reflux disease)    History of blood transfusion 1980's   2nd surgical procedures   Hypercholesteremia    Hypothyroidism    Myocardial infarction ([place]) 02/2012   NSTEMI (non-ST elevated myocardial infarction) ([place]) 04/02/2015   OSA (obstructive sleep apnea) 05/13/2016   Ovarian tumor    PAD (peripheral artery  disease) ([place])    a. s/p LE angio 2015; followed by Dr. Fletcher Anon - managed medically.   Pericardial effusion    a. 06/2016 after ppm - s/p pericardiocentesis.   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 s/p [employer]tr[employer] PPM 06/2016, c/b lead perf/pericardial effusion.   TIA (transient ischemic attack)    Type 2 diabetes with nephropathy ([place]) 02/29/2012    Family History  Problem Relation Age of Onset   Heart disease Mother        deceased   Heart disease Father        deceased, heart disease   Diabetes Brother    Heart disease Brother    Thyroid disease Brother    Heart disease Sister    Heart disease Brother    Thyroid disease Brother    Lupus Daughter    Colon cancer Neg Hx     Past Surgical History:  Procedure Laterality Date   ABDOMINAL AORTAGRAM N/A 01/03/2014   Procedure: ABDOMINAL [name];  Surgeon: Wellington Hampshire, MD;  Location: [facility];  Service: Cardiovascular;  Laterality: N/A;   ABDOMINAL HYSTERECTOMY  06/22/1970   "partial"   APPENDECTOMY  02/20/1969   CARDIAC CATHETERIZATION  06/22/2006   Tiny OM-2 with 90% narrowing. Med tx.   CARDIAC CATHETERIZATION N/A 11/30/2014   Procedure: Left Heart Cath and Coronary Angiography;  Surgeon: Troy Sine, MD; LAD 20%, CFX 50%, OM1 95%, right PLB 30%, LV normal    CARDIAC CATHETERIZATION N/A 11/30/2014   Procedure: Coronary Balloon Angioplasty;  Surgeon: Troy Sine, MD;  Angiosculpt scoring balloon and PTCA to the OM1 reducing stenosis from 95% to less than 10%   CARDIAC CATHETERIZATION N/A 04/03/2015   Procedure: Left Heart Cath and Coronary Angiography;  Surgeon: Jolaine Artist, MD; dLAD 50%,  CFX 90%, OM1 100%, PLA 15%, LVEDP 13     CARDIAC CATHETERIZATION N/A 04/03/2015   Procedure: Coronary Stent Intervention;  Surgeon: Sherren Mocha, MD; 3.0x18 mm Xience DES to the CFX     CARDIAC CATHETERIZATION N/A 08/02/2015   Procedure: Left Heart  Cath and Coronary Angiography;  Surgeon: Troy Sine, MD;  Location: [facility];  Service: Cardiovascular;  Laterality: N/A;   CARDIAC CATHETERIZATION N/A 08/02/2015   Procedure: Coronary Stent Intervention;  Surgeon: Troy Sine, MD;  Location: [facility];  Service: Cardiovascular;  Laterality: N/A;   CARDIAC CATHETERIZATION N/A 06/25/2016   Procedure: Pericardiocentesis;  Surgeon: Will Meredith Leeds, MD;  Location: [facility];  Service: Cardiovascular;  Laterality: N/A;   cardiac stents     CARDIOVERSION N/A 12/15/2017   Procedure: CARDIOVERSION;  Surgeon: Acie Fredrickson Wonda Cheng, MD;  Location: [facility];  Service: Cardiovascular;  Laterality: N/A;   CHOLECYSTECTOMY OPEN  02/20/1989   COLONOSCOPY  06/23/2003   Dr. Laural Golden: pancolonic divericula, polyp, path unknown currently   COLONOSCOPY  06/22/2010   Dr. Oneida Alar: Normal TI, scattered diverticula in entire colon, small internal hemorrhoids, normal colon biopsies. Colonoscopy in 5-10 years.    COLONOSCOPY WITH PROPOFOL N/A 06/02/2021   pancolonic diverticulosis. Two 4-6 mm polyps in transverse colon. Sessile serrated and hyperplastic. 5 year surveillance if benefits outweigh the risks.   COLOSTOMY  05/23/1979   COLOSTOMY REVERSAL  11/21/1979   EP IMPLANTABLE DEVICE N/A 06/25/2016   Procedure: Lead Revision/Repair;  Surgeon: Will Meredith Leeds, MD;  Location: [facility];  Service: Cardiovascular;  Laterality: N/A;   EP IMPLANTABLE DEVICE N/A 06/25/2016   Procedure: Pacemaker Implant;  Surgeon: Will Meredith Leeds, MD;  Location: [facility];  Service: Cardiovascular;  Laterality: N/A;   EXCISIONAL HEMORRHOIDECTOMY  02/20/1969   EYE SURGERY Left  06/22/1998   "branch vein occlusion"   EYE SURGERY Left ~ 2001   "smoothed out wrinkle"   LEFT OOPHORECTOMY  05/23/1979   nicked bowel, peritonitis, colostomy; colostomy reversed 1981    LOWER EXTREMITY ANGIOGRAM N/A 01/03/2014   Procedure: LOWER EXTREMITY ANGIOGRAM;  Surgeon: Wellington Hampshire, MD;  Location: [facility];  Service: Cardiovascular;  Laterality: N/A;   Nuclear med stress test  10/21/2011   Small area of mild ischemia inferoapically.   PARTIAL HYSTERECTOMY  02/20/1969   left ovaries, then ovaries removed later due tumors    POLYPECTOMY  06/02/2021   Procedure: POLYPECTOMY;  Surgeon: Eloise Harman, DO;  [facility];  Service: Endoscopy;;   right eye surgery  03/2021   RIGHT OOPHORECTOMY  02/20/1969   Social History   Occupational History   Occupation: Retired    Fish farm manager: RETIRED    Comment: Research officer, political party  Tobacco Use   Smoking status: Never    Passive exposure: Never   Smokeless tobacco: Never   Tobacco comments:    Never smoked  Vaping Use   Vaping Use: Never used  Substance and Sexual Activity   Alcohol use: No    Alcohol/week: 0.0 standard drinks of alcohol   Drug use: No   Sexual activity: Never    Birth control/protection: Surgical    Comment: hyst

## 2022-02-02 NOTE — Progress Notes (Signed)
Remote pacemaker transmission.   

## 2022-02-03 ENCOUNTER — Other Ambulatory Visit: Payer: Self-pay

## 2022-02-03 DIAGNOSIS — E1165 Type 2 diabetes mellitus with hyperglycemia: Secondary | ICD-10-CM

## 2022-02-03 MED ORDER — NOVOLIN 70/30 (70-30) 100 UNIT/ML ~~LOC~~ SUSP: SUBCUTANEOUS | 3 refills | Status: DC

## 2022-02-06 ENCOUNTER — Encounter: Payer: Self-pay | Admitting: Cardiology

## 2022-02-09 ENCOUNTER — Ambulatory Visit (INDEPENDENT_AMBULATORY_CARE_PROVIDER_SITE_OTHER): Payer: HMO | Admitting: Internal Medicine

## 2022-02-09 ENCOUNTER — Encounter: Payer: Self-pay | Admitting: Internal Medicine

## 2022-02-09 VITALS — BP 146/84 | HR 75 | Ht 63.0 in | Wt 137.0 lb

## 2022-02-09 DIAGNOSIS — E785 Hyperlipidemia, unspecified: Secondary | ICD-10-CM

## 2022-02-09 DIAGNOSIS — E039 Hypothyroidism, unspecified: Secondary | ICD-10-CM

## 2022-02-09 DIAGNOSIS — E1121 Type 2 diabetes mellitus with diabetic nephropathy: Secondary | ICD-10-CM

## 2022-02-09 LAB — POCT GLYCOSYLATED HEMOGLOBIN (HGB A1C): Hemoglobin A1C: 6 % — AB (ref 4.0–5.6)

## 2022-02-09 NOTE — Patient Instructions (Addendum)
Please continue: - NPH/R 70/30 25 units in am and 15 units before dinner - Ozempic 2 mg weekly  Let me know when you finish the 2 mg Ozempic dose to decrease to 1 mg weekly dose.  Continue Levothyroxine 112 mcg daily.  Take the thyroid hormone every day, with water, at least 30 minutes before breakfast, separated by at least 4 hours from: - acid reflux medications - calcium - iron - multivitamins  Please return in 4-6 months.

## 2022-02-09 NOTE — Addendum Note (Signed)
Addended by: Sarina Ill on: 02/09/2022 10:00 AM   Modules accepted: Orders

## 2022-02-09 NOTE — Progress Notes (Addendum)
Patient ID: MA MUNOZ, female   DOB: [birth year], 76 y.o.   MRN: [id number]   HPI: Virginia Huber is a 76 y.o.-year-old female, initially referred by her PCP, Dr.Golding, returning for follow-up for DM2, dx in ~2005, insulin-dependent since 2006, uncontrolled, with complications (CAD, s/p NSTEMI, s/p PTCA and DES; CHF; PAF/flutter; PAD; CKD; cerebrovascular disease - s/p TIA; PN).  She previously saw my colleague, Dr. Loanne Drilling.  Our last visit was 4 months ago.  Interim history: She continues to have blurry vision and getting intraocular injections. She has onychomycosis and sees podiatry. No increased urination, nausea, chest pain. She had dizziness -  stopped 5 medicines >> now much better. She fell and fractured a R metatarsal 4 weeks ago. She is in  boot.  DM2: Reviewed HbA1c levels: Lab Results  Component Value Date   HGBA1C 6.7 (A) 10/09/2021   HGBA1C 7.5 (A) 06/06/2021   HGBA1C 7.7 (A) 02/04/2021   HGBA1C 7.4 (A) 10/22/2020   HGBA1C 6.7 (A) 07/23/2020   HGBA1C 7.3 (A) 04/15/2020   HGBA1C 8.7 (A) 02/14/2020   HGBA1C 7.7 12/04/2019   HGBA1C 7.0 (H) 08/24/2019   HGBA1C 6.8 (H) 12/17/2017   Pt is on a regimen of: - NPH/R 70/30 20-22 >> 28 units in am and 28-30 >> 24-26 >> 25 units before dinner >> 25-28 units in am and 25 units before dinner >>  25 units in am and 15 units before dinner - Trulicity 3 mg weekly >> Ozempic 2 mg weekly -started 05/2021 No FH of MTC or personal h/o pancreatitis. She was on Metformin >> stopped b/c CKD.  Pt checks her sugars 4x a day with the Libre 2 CGM:   Prev.:   Previously:   Lowest sugar was 59 (CGM) >> 50s (sensor - higher on glucometer: 70) >> 70 >> 55; she has hypoglycemia awareness in the 70s.  Highest sugar was 208 >> 200 >> 200s >> 190s.  Glucometer: Freestyle lite  Patient starting to improve her meals after her last HbA1c returned: - Brunch: scrambled egg + bacon + rye bread or cheerios + skim milk - Dinner: meat -  grilled + veggies - Snacks: seldom - apple Drinks occasional diet drinks  -+ CKD- Dr. Theador Hawthorne, last BUN/creatinine:  Lab Results  Component Value Date   BUN 25 (H) 05/29/2021   BUN 30 (H) 08/22/2020   CREATININE 1.58 (H) 05/29/2021   CREATININE 1.79 (H) 08/22/2020  06/24/2020: BUN/creatinine 24/1.7 On Benicar 40.  -+ HL; last set of lipids: 07/18/2021: 126/160/41/58 07/17/2020: 121/146/38/58 Lab Results  Component Value Date   CHOL 166 08/05/2017   HDL 43 (L) 08/05/2017   LDLCALC 96 08/05/2017   TRIG 160 (H) 08/05/2017   CHOLHDL 3.9 08/05/2017  On Lipitor 80.  - last eye exam was on 09/22/2021: Severe NPDR OD + macular edema. Dr Zadie Rhine.  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  She has a history of BRAO.  She had eye surgery 03/2021 >> vision is better.  - she has numbness and tingling in her feet.  Latest foot exam was performed by podiatry 08/07/2021.  Pt has FH of DM in M and her family.  Hypothyroidism:  Pt is on levothyroxine  112 mcg daily (dose decreased to 08/2021), taken: - in am - fasting - at least 30 min from b'fast - no calcium - no iron - no multivitamins - no PPIs - not on Biotin  TSH levels were  reviewed: Lab Results  Component Value Date   TSH 1.18 10/09/2021   TSH 0.183 (L) 08/25/2021   TSH 0.40 02/04/2021   TSH 0.22 (L) 10/22/2020   TSH 0.233 (L) 08/22/2020   TSH 2.410 02/22/2020   TSH 0.424 (L) 08/15/2019   TSH 0.059 (L) 12/17/2017   TSH 0.05 (L) 12/06/2017   TSH 11.574 (H) 09/23/2017   She has an extensive cardiac history.  Besides mentioned in HPI, she also has a history of heart block, tachybradycardia syndrome, pericardial effusion (history of tamponade).  She is on amiodarone, Eliquis, Plavix, Lasix.    ROS: + see HPI  I reviewed pt's medications, allergies, PMH, social hx, family hx, and changes were documented in the history of present illness. Otherwise, unchanged from my initial visit  note.  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 h/o PAF/flutter during admission in 2013 for PNA. b. PAF during adm for NSTEMI 07/2015, subsequent paroxysms since then.   B12 deficiency anemia    Branch retinal vein occlusion with macular edema of right eye 10/11/2019   Chronic renal failure, stage 3b ([place]) 09/23/2017   Coronary artery disease 11/30/2014   a. remote MI. b. h/o PTCA with scoring balloon to OM1 11/2014. c. NSTEMI 03/2015 s/p DES to prox-mid Cx. d. NSTEMI 07/2015 s/p scoring balloon/PTCA/DES to dRCA with PAF during that admission   Cutaneous lupus erythematosus    Early stage nonexudative age-related macular degeneration of right eye 02/27/2020   Essential hypertension    GERD (gastroesophageal reflux disease)    History of blood transfusion 1980's   2nd surgical procedures   Hypercholesteremia    Hypothyroidism    Myocardial infarction ([place]) 02/2012   NSTEMI (non-ST elevated myocardial infarction) ([place]) 04/02/2015   OSA (obstructive sleep apnea) 05/13/2016   Ovarian tumor    PAD (peripheral artery disease) ([place])    a. s/p LE angio 2015; followed by Dr. Fletcher Anon - managed medically.   Pericardial effusion    a. 06/2016 after ppm - s/p pericardiocentesis.   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 s/p Medtronic PPM 06/2016, c/b lead perf/pericardial  effusion.   TIA (transient ischemic attack)    Type 2 diabetes with nephropathy ([place]) 02/29/2012   Past Surgical History:  Procedure Laterality Date   ABDOMINAL AORTAGRAM N/A 01/03/2014   Procedure: ABDOMINAL [name];  Surgeon: Wellington Hampshire, MD;  Location: [place] [facility] CATH LAB;  Service: Cardiovascular;  Laterality: N/A;   ABDOMINAL HYSTERECTOMY  06/22/1970   "partial"   APPENDECTOMY  02/20/1969   CARDIAC CATHETERIZATION  06/22/2006   Tiny OM-2 with 90% narrowing. Med tx.   CARDIAC CATHETERIZATION N/A 11/30/2014   Procedure: Left Heart Cath and Coronary Angiography;  Surgeon: Troy Sine, MD; LAD 20%, CFX 50%, OM1 95%, right PLB 30%, LV normal    CARDIAC CATHETERIZATION N/A 11/30/2014   Procedure: Coronary Balloon Angioplasty;  Surgeon: Troy Sine,  MD;  Angiosculpt scoring balloon and PTCA to the OM1 reducing stenosis from 95% to less than 10%   CARDIAC CATHETERIZATION N/A 04/03/2015   Procedure: Left Heart Cath and Coronary Angiography;  Surgeon: Jolaine Artist, MD; dLAD 50%, CFX 90%, OM1 100%, PLA 15%, LVEDP 13     CARDIAC CATHETERIZATION N/A 04/03/2015   Procedure: Coronary Stent Intervention;  Surgeon: Sherren Mocha, MD; 3.0x18 mm Xience DES to the CFX     CARDIAC CATHETERIZATION N/A 08/02/2015   Procedure: Left Heart Cath and Coronary Angiography;  Surgeon: Troy Sine, MD;  Location: [facility] CV LAB;  Service: Cardiovascular;  Laterality: N/A;   CARDIAC CATHETERIZATION N/A 08/02/2015   Procedure: Coronary Stent Intervention;  Surgeon: Troy Sine, MD;  Location: [place] CV LAB;  Service: Cardiovascular;  Laterality: N/A;   CARDIAC CATHETERIZATION N/A 06/25/2016   Procedure: Pericardiocentesis;  Surgeon: Will Meredith Leeds, MD;  Location: [place] CV LAB;  Service: Cardiovascular;  Laterality: N/A;   cardiac stents     CARDIOVERSION N/A 12/15/2017   Procedure: CARDIOVERSION;  Surgeon: Acie Fredrickson Wonda Cheng, MD;  Location: [facility] ENDOSCOPY;  Service: Cardiovascular;   Laterality: N/A;   CHOLECYSTECTOMY OPEN  02/20/1989   COLONOSCOPY  06/23/2003   Dr. Laural Golden: pancolonic divericula, polyp, path unknown currently   COLONOSCOPY  06/22/2010   Dr. Oneida Alar: Normal TI, scattered diverticula in entire colon, small internal hemorrhoids, normal colon biopsies. Colonoscopy in 5-10 years.    COLONOSCOPY WITH PROPOFOL N/A 06/02/2021   pancolonic diverticulosis. Two 4-6 mm polyps in transverse colon. Sessile serrated and hyperplastic. 5 year surveillance if benefits outweigh the risks.   COLOSTOMY  05/23/1979   COLOSTOMY REVERSAL  11/21/1979   EP IMPLANTABLE DEVICE N/A 06/25/2016   Procedure: Lead Revision/Repair;  Surgeon: Will Meredith Leeds, MD;  Location: [place] CV LAB;  Service: Cardiovascular;  Laterality: N/A;   EP IMPLANTABLE DEVICE N/A 06/25/2016   Procedure: Pacemaker Implant;  Surgeon: Will Meredith Leeds, MD;  Location: [place] CV LAB;  Service: Cardiovascular;  Laterality: N/A;   EXCISIONAL HEMORRHOIDECTOMY  02/20/1969   EYE SURGERY Left 06/22/1998   "branch vein occlusion"   EYE SURGERY Left ~ 2001   "smoothed out wrinkle"   LEFT OOPHORECTOMY  05/23/1979   nicked bowel, peritonitis, colostomy; colostomy reversed 1981    LOWER EXTREMITY ANGIOGRAM N/A 01/03/2014   Procedure: LOWER EXTREMITY ANGIOGRAM;  Surgeon: Wellington Hampshire, MD;  Location: [place] CATH LAB;  Service: Cardiovascular;  Laterality: N/A;   Nuclear med stress test  10/21/2011   Small area of mild ischemia inferoapically.   PARTIAL HYSTERECTOMY  02/20/1969   left ovaries, then ovaries removed later due tumors    POLYPECTOMY  06/02/2021   Procedure: POLYPECTOMY;  Surgeon: Eloise Harman, DO;  [facility];  Service: Endoscopy;;   right eye surgery  03/2021   RIGHT OOPHORECTOMY  02/20/1969   Social History   Socioeconomic History   Marital status: Married    Spouse name: Not on file   Number of children: Not on file   Years of education: Not on file   Highest  education level: Not on file  Occupational History   Occupation: Retired    Fish farm manager: RETIRED    Comment: insurance billing  Tobacco Use   Smoking status: Never    Passive exposure: Never   Smokeless tobacco: Never   Tobacco comments:    Never smoked  Vaping Use   Vaping Use: Never used  Substance and Sexual Activity  Alcohol use: No    Alcohol/week: 0.0 standard drinks of alcohol   Drug use: No   Sexual activity: Never    Birth control/protection: Surgical    Comment: hyst  Other Topics Concern   Not on file  Social History Narrative   Not on file   Social Determinants of Health   Financial Resource Strain: Not on file  Food Insecurity: Not on file  Transportation Needs: Not on file  Physical Activity: Not on file  Stress: Not on file  Social Connections: Not on file  Intimate Partner Violence: Not on file   Current Outpatient Medications on File Prior to Visit  Medication Sig Dispense Refill   acetaminophen (TYLENOL) 325 MG tablet Take 650 mg by mouth every 6 (six) hours as needed for headache.     amiodarone (PACERONE) 200 MG tablet Take 0.5 tablets (100 mg total) by mouth daily. 45 tablet 1   apixaban (ELIQUIS) 5 MG TABS tablet Take 1 tablet (5 mg total) by mouth 2 (two) times daily. 30 tablet 5   atorvastatin (LIPITOR) 80 MG tablet TAKE ONE TABLET BY MOUTH EVERY DAY AT 6:00PM 90 tablet 3   calcitRIOL (ROCALTROL) 0.25 MCG capsule Take 0.25 mcg by mouth 3 (three) times a week.     carvedilol (COREG) 6.25 MG tablet Take 1 tablet (6.25 mg total) by mouth 2 (two) times daily. 60 tablet 3   Cholecalciferol (VITAMIN D3 PO) Take 2,000 Units by mouth every evening.     clopidogrel (PLAVIX) 75 MG tablet TAKE ONE TABLET ('75MG'$  TOTAL) BY MOUTH DAILY 90 tablet 3   Continuous Blood Gluc Receiver (FREESTYLE LIBRE 2 READER) DEVI 1 each by Does not apply route daily. 1 each 0   Continuous Blood Gluc Sensor (FREESTYLE LIBRE 2 SENSOR) MISC USE ONE FOR FOURTEEN DAYS 6 each 3   glucose  blood (FREESTYLE PRECISION NEO TEST) test strip Use as instructed 100 each 3   insulin NPH-regular Human (NOVOLIN 70/30) (70-30) 100 UNIT/ML injection INJECT 25 UNITS BEFORE BREAKFAST AND 15 UNITS BEFORE SUPPER 45 mL 3   levothyroxine (SYNTHROID) 112 MCG tablet TAKE ONE TABLET BY MOUTH ONCE DAILY 90 tablet 0   nitroGLYCERIN (NITROSTAT) 0.4 MG SL tablet Place 1 tablet (0.4 mg total) under the tongue every 5 (five) minutes as needed for chest pain. 25 tablet 0   rOPINIRole (REQUIP) 0.5 MG tablet Take 0.5 mg by mouth at bedtime.   0   Semaglutide, 2 MG/DOSE, 8 MG/3ML SOPN Inject 2 mg as directed once a week. 9 mL 3   No current facility-administered medications on file prior to visit.   Allergies  Allergen Reactions   Penicillins Hives    Has patient had a PCN reaction causing immediate rash, facial/tongue/throat swelling, SOB or lightheadedness with hypotension: Yes Has patient had a PCN reaction causing severe rash involving mucus membranes or skin necrosis: No Has patient had a PCN reaction that required hospitalization No Has patient had a PCN reaction occurring within the last 10 years: No If all of the above answers are "NO", then may proceed with Cephalosporin use.   Percocet [Oxycodone-Acetaminophen] Nausea And Vomiting   Family History  Problem Relation Age of Onset   Heart disease Mother        deceased   Heart disease Father        deceased, heart disease   Diabetes Brother    Heart disease Brother    Thyroid disease Brother    Heart disease Sister  Heart disease Brother    Thyroid disease Brother    Lupus Daughter    Colon cancer Neg Hx    PE: BP (!) 146/84 (BP Location: Left Arm, Patient Position: Sitting, Cuff Size: Normal)   Pulse 75   Ht '5\' 3"'$  (1.6 m)   Wt 137 lb (62.1 kg)   SpO2 98%   BMI 24.27 kg/m  Wt Readings from Last 3 Encounters:  02/09/22 137 lb (62.1 kg)  01/13/22 138 lb (62.6 kg)  12/09/21 141 lb 6.4 oz (64.1 kg)   Constitutional: normal weight,  in NAD Eyes: no exophthalmos ENT: moist mucous membranes, no masses palpated in neck, no cervical lymphadenopathy Cardiovascular: RRR, No MRG Respiratory: CTA B Musculoskeletal: no deformities Skin: moist, warm, no rashes Neurological: + tremor with outstretched hands   ASSESSMENT: 1. DM2, insulin-dependent, now more controlled, withcomplications - CAD, s/p NSTEMI 07/2015 and 03/2015, s/p PTCA and DES - CHF - PAF/flutter - PAD - cerebrovascular disease - s/p TIA - CKD  2. HL  3.  Hypothyroidism  PLAN:  1. Patient with longstanding, previously uncontrolled, type 2 diabetes, on premixed insulin regimen and GLP-1 receptor agonist, with blood sugars improved after changing from Trulicity to [place].  At last visit I advised her to decrease the dose of her premixed insulin with since the sugars were well controlled but with occasional low blood sugars overnight. -we previously discussed about adding an SGLT2 inhibitor in the past but I advised her to check with her nephrologist first.  She saw Dr. Theador Hawthorne in 04/2020 and he wanted [place] to hold off using an SGLT2 inhibitor as her kidney function was trending down.  The GFR was improved in 12/2020. CGM interpretation: -At today's visit, we reviewed her CGM downloads: It appears that 97% of values are in target range (goal >70%), while 2% are higher than 180 (goal <25%), and 1% are lower than 70 (goal <4%).  The calculated average blood sugar is 112.  The projected HbA1c for the next 3 months (GMI) is 6.0%. -Reviewing the CGM trends, sugars are better controlled compared to last visit, with only mild infrequent hyperglycemic peaks after dinner.  For now, I would not suggest a change in regimen. -Patient mentions that she lost a little bit more weight since last visit, per my review of the records, 4 pounds.  We did discuss about possibly backing off the dose of Ozempic.  However, this is expensive for her and she has the 2 mg pens at home and would  like to continue with this for now.  I advised her to let me know if she runs out of these so we can call in the 1 mg dose. - I suggested to:  Patient Instructions  Please continue: - NPH/R 70/30 25 units in am and 15 units before dinner - Ozempic 2 mg weekly  Let me know when you finish the 2 mg Ozempic dose to decrease to 1 mg weekly dose.  Continue Levothyroxine 112 mcg daily.  Take the thyroid hormone every day, with water, at least 30 minutes before breakfast, separated by at least 4 hours from: - acid reflux medications - calcium - iron - multivitamins  Please return in 4-6 months.  - we checked her HbA1c: 6% (lower) - advised to check sugars at different times of the day - 4x a day, rotating check times - advised for yearly eye exams >> she is UTD - return to clinic in 3-4 months  2. HL -Reviewed latest lipid  panel from 06/2021: 126/160/41/58 -LDL was slightly above our goal of less than 55 due to history of cardiovascular disease -We will continue Lipitor 80 mg daily-no side effects  3.  Hypothyroidism - latest thyroid labs reviewed with pt. >> normal: Lab Results  Component Value Date   TSH 1.18 10/09/2021  - she continues on LT4 112 mcg daily - pt feels good on this dose. - we discussed about taking the thyroid hormone every day, with water, >30 minutes before breakfast, separated by >4 hours from acid reflux medications, calcium, iron, multivitamins. Pt. is taking it correctly.   Philemon Kingdom, MD PhD [facility]

## 2022-02-10 ENCOUNTER — Ambulatory Visit (INDEPENDENT_AMBULATORY_CARE_PROVIDER_SITE_OTHER): Payer: HMO

## 2022-02-10 ENCOUNTER — Ambulatory Visit (INDEPENDENT_AMBULATORY_CARE_PROVIDER_SITE_OTHER): Payer: HMO | Admitting: Orthopedic Surgery

## 2022-02-10 DIAGNOSIS — S92355A Nondisplaced fracture of fifth metatarsal bone, left foot, initial encounter for closed fracture: Secondary | ICD-10-CM | POA: Diagnosis not present

## 2022-02-16 ENCOUNTER — Ambulatory Visit (INDEPENDENT_AMBULATORY_CARE_PROVIDER_SITE_OTHER): Payer: HMO | Admitting: Ophthalmology

## 2022-02-16 ENCOUNTER — Encounter (INDEPENDENT_AMBULATORY_CARE_PROVIDER_SITE_OTHER): Payer: HMO | Admitting: Ophthalmology

## 2022-02-16 ENCOUNTER — Encounter (INDEPENDENT_AMBULATORY_CARE_PROVIDER_SITE_OTHER): Payer: Self-pay | Admitting: Ophthalmology

## 2022-02-16 DIAGNOSIS — H34831 Tributary (branch) retinal vein occlusion, right eye, with macular edema: Secondary | ICD-10-CM | POA: Diagnosis not present

## 2022-02-16 DIAGNOSIS — E113411 Type 2 diabetes mellitus with severe nonproliferative diabetic retinopathy with macular edema, right eye: Secondary | ICD-10-CM | POA: Diagnosis not present

## 2022-02-16 DIAGNOSIS — H43311 Vitreous membranes and strands, right eye: Secondary | ICD-10-CM | POA: Diagnosis not present

## 2022-02-16 DIAGNOSIS — H353111 Nonexudative age-related macular degeneration, right eye, early dry stage: Secondary | ICD-10-CM

## 2022-02-16 MED ORDER — AFLIBERCEPT 2MG/0.05ML IZ SOLN FOR KALEIDOSCOPE
2.0000 mg | INTRAVITREAL | Status: AC | PRN
  Administered 2022-02-16: 2 mg via INTRAVITREAL

## 2022-02-16 NOTE — Assessment & Plan Note (Signed)
Improved post vitrectomy

## 2022-02-16 NOTE — Assessment & Plan Note (Signed)
Stable at this time 

## 2022-02-16 NOTE — Progress Notes (Signed)
02/16/2022     CHIEF COMPLAINT Patient presents for  Chief Complaint  Patient presents with   Branch Retinal Vein Occlusion      HISTORY OF PRESENT ILLNESS: Virginia Huber is a 76 y.o. female who presents to the clinic today for:   HPI   5 weeks for DILATE OD, EYLEA, OCT. Pt stated vision has remained stable since last visit.  Last edited by Silvestre Moment on 02/16/2022  8:58 AM.      Referring physician: Sharilyn Sites, MD [address] [place],  [place] [address]  HISTORICAL INFORMATION:   Selected notes from the MEDICAL RECORD NUMBER    Lab Results  Component Value Date   HGBA1C 6.0 (A) 02/09/2022     CURRENT MEDICATIONS: No current outpatient medications on file. (Ophthalmic Drugs)   No current facility-administered medications for this visit. (Ophthalmic Drugs)   Current Outpatient Medications (Other)  Medication Sig   acetaminophen (TYLENOL) 325 MG tablet Take 650 mg by mouth every 6 (six) hours as needed for headache.   amiodarone (PACERONE) 200 MG tablet Take 0.5 tablets (100 mg total) by mouth daily.   apixaban (ELIQUIS) 5 MG TABS tablet Take 1 tablet (5 mg total) by mouth 2 (two) times daily.   atorvastatin (LIPITOR) 80 MG tablet TAKE ONE TABLET BY MOUTH EVERY DAY AT 6:00PM   calcitRIOL (ROCALTROL) 0.25 MCG capsule Take 0.25 mcg by mouth 3 (three) times a week.   carvedilol (COREG) 6.25 MG tablet Take 1 tablet (6.25 mg total) by mouth 2 (two) times daily.   Cholecalciferol (VITAMIN D3 PO) Take 2,000 Units by mouth every evening.   clopidogrel (PLAVIX) 75 MG tablet TAKE ONE TABLET ('75MG'$  TOTAL) BY MOUTH DAILY   Continuous Blood Gluc Receiver (FREESTYLE LIBRE 2 READER) DEVI 1 each by Does not apply route daily.   Continuous Blood Gluc Sensor (FREESTYLE LIBRE 2 SENSOR) MISC USE ONE FOR FOURTEEN DAYS   glucose blood (FREESTYLE PRECISION NEO TEST) test strip Use as instructed   insulin NPH-regular Human (NOVOLIN 70/30) (70-30) 100 UNIT/ML injection INJECT 25  UNITS BEFORE BREAKFAST AND 15 UNITS BEFORE SUPPER   levothyroxine (SYNTHROID) 112 MCG tablet TAKE ONE TABLET BY MOUTH ONCE DAILY   nitroGLYCERIN (NITROSTAT) 0.4 MG SL tablet Place 1 tablet (0.4 mg total) under the tongue every 5 (five) minutes as needed for chest pain.   rOPINIRole (REQUIP) 0.5 MG tablet Take 0.5 mg by mouth at bedtime.    Semaglutide, 2 MG/DOSE, 8 MG/3ML SOPN Inject 2 mg as directed once a week.   No current facility-administered medications for this visit. (Other)      REVIEW OF SYSTEMS: ROS   Negative for: Constitutional, Gastrointestinal, Neurological, Skin, Genitourinary, Musculoskeletal, HENT, Endocrine, Cardiovascular, Eyes, Respiratory, Psychiatric, Allergic/Imm, Heme/Lymph Last edited by Silvestre Moment on 02/16/2022  8:58 AM.       ALLERGIES Allergies  Allergen Reactions   Penicillins Hives    Has patient had a PCN reaction causing immediate rash, facial/tongue/throat swelling, SOB or lightheadedness with hypotension: Yes Has patient had a PCN reaction causing severe rash involving mucus membranes or skin necrosis: No Has patient had a PCN reaction that required hospitalization No Has patient had a PCN reaction occurring within the last 10 years: No If all of the above answers are "NO", then may proceed with Cephalosporin use.   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 h/o PAF/flutter during admission in 2013 for PNA. b. PAF during adm for NSTEMI 07/2015, subsequent paroxysms since then.   B12 deficiency anemia    Branch retinal vein occlusion with macular edema of right eye  10/11/2019   Chronic renal failure, stage 3b ([place]) 09/23/2017   Coronary artery disease 11/30/2014   a. remote MI. b. h/o PTCA with scoring balloon to OM1 11/2014. c. NSTEMI 03/2015 s/p DES to prox-mid Cx. d. NSTEMI 07/2015 s/p scoring balloon/PTCA/DES to dRCA with PAF during that admission   Cutaneous lupus erythematosus    Early stage nonexudative age-related macular degeneration of right eye 02/27/2020   Essential hypertension    GERD (gastroesophageal reflux disease)    History of blood transfusion 1980's   2nd surgical procedures   Hypercholesteremia    Hypothyroidism    Myocardial infarction ([place]) 02/2012   NSTEMI (non-ST elevated myocardial infarction) ([place]) 04/02/2015   OSA (obstructive sleep apnea) 05/13/2016   Ovarian tumor    PAD (peripheral artery disease) ([place])    a. s/p LE angio 2015; followed by Dr. Fletcher Anon - managed medically.   Pericardial effusion    a. 06/2016 after ppm - s/p pericardiocentesis.   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 s/p Medtronic PPM 06/2016, c/b lead perf/pericardial effusion.   TIA (transient ischemic attack)    Type 2 diabetes with nephropathy ([place]) 02/29/2012   Past Surgical History:  Procedure Laterality Date   ABDOMINAL AORTAGRAM N/A 01/03/2014   Procedure: ABDOMINAL [name];  Surgeon: Wellington Hampshire, MD;  Location: [facility] CATH LAB;  Service: Cardiovascular;  Laterality: N/A;   ABDOMINAL HYSTERECTOMY  06/22/1970   "partial"   APPENDECTOMY  02/20/1969   CARDIAC CATHETERIZATION  06/22/2006   Tiny OM-2 with 90% narrowing. Med tx.   CARDIAC CATHETERIZATION N/A 11/30/2014   Procedure: Left Heart Cath and Coronary Angiography;   Surgeon: Troy Sine, MD; LAD 20%, CFX 50%, OM1 95%, right PLB 30%, LV normal    CARDIAC CATHETERIZATION N/A 11/30/2014   Procedure: Coronary Balloon Angioplasty;  Surgeon: Troy Sine, MD;  Angiosculpt scoring balloon and PTCA to the OM1 reducing stenosis from 95% to less than 10%   CARDIAC CATHETERIZATION N/A 04/03/2015   Procedure: Left Heart Cath and Coronary Angiography;  Surgeon: Jolaine Artist, MD; dLAD 50%, CFX 90%, OM1 100%, PLA 15%, LVEDP 13     CARDIAC CATHETERIZATION N/A 04/03/2015   Procedure: Coronary Stent Intervention;  Surgeon: Sherren Mocha, MD; 3.0x18 mm Xience DES to the CFX     CARDIAC CATHETERIZATION N/A 08/02/2015   Procedure: Left Heart Cath and Coronary Angiography;  Surgeon: Troy Sine, MD;  Location: [facility] CV LAB;  Service: Cardiovascular;  Laterality: N/A;   CARDIAC CATHETERIZATION N/A 08/02/2015   Procedure: Coronary Stent Intervention;  Surgeon: Troy Sine, MD;  Location: [facility] CV LAB;  Service: Cardiovascular;  Laterality: N/A;   CARDIAC CATHETERIZATION N/A 06/25/2016   Procedure: Pericardiocentesis;  Surgeon: [employer],  MD;  Location: [facility] CV LAB;  Service: Cardiovascular;  Laterality: N/A;   cardiac stents     CARDIOVERSION N/A 12/15/2017   Procedure: CARDIOVERSION;  Surgeon: Acie Fredrickson Wonda Cheng, MD;  Location: [facility] ENDOSCOPY;  Service: Cardiovascular;  Laterality: N/A;   CHOLECYSTECTOMY OPEN  02/20/1989   COLONOSCOPY  06/23/2003   Dr. Laural Golden: pancolonic divericula, polyp, path unknown currently   COLONOSCOPY  06/22/2010   Dr. Oneida Alar: Normal TI, scattered diverticula in entire colon, small internal hemorrhoids, normal colon biopsies. Colonoscopy in 5-10 years.    COLONOSCOPY WITH PROPOFOL N/A 06/02/2021   pancolonic diverticulosis. Two 4-6 mm polyps in transverse colon. Sessile serrated and hyperplastic. 5 year surveillance if benefits outweigh the risks.   COLOSTOMY  05/23/1979   COLOSTOMY REVERSAL  11/21/1979   EP  IMPLANTABLE DEVICE N/A 06/25/2016   Procedure: Lead Revision/Repair;  Surgeon: Will Meredith Leeds, MD;  Location: [facility] CV LAB;  Service: Cardiovascular;  Laterality: N/A;   EP IMPLANTABLE DEVICE N/A 06/25/2016   Procedure: Pacemaker Implant;  Surgeon: Will Meredith Leeds, MD;  Location: [facility] CV LAB;  Service: Cardiovascular;  Laterality: N/A;   EXCISIONAL HEMORRHOIDECTOMY  02/20/1969   EYE SURGERY Left 06/22/1998   "branch vein occlusion"   EYE SURGERY Left ~ 2001   "smoothed out wrinkle"   LEFT OOPHORECTOMY  05/23/1979   nicked bowel, peritonitis, colostomy; colostomy reversed 1981    LOWER EXTREMITY ANGIOGRAM N/A 01/03/2014   Procedure: LOWER EXTREMITY ANGIOGRAM;  Surgeon: Wellington Hampshire, MD;  Location: [facility] CATH LAB;  Service: Cardiovascular;  Laterality: N/A;   Nuclear med stress test  10/21/2011   Small area of mild ischemia inferoapically.   PARTIAL HYSTERECTOMY  02/20/1969   left ovaries, then ovaries removed later due tumors    POLYPECTOMY  06/02/2021   Procedure: POLYPECTOMY;  Surgeon: Eloise Harman, DO;  [facility];  Service: Endoscopy;;   right eye surgery  03/2021   RIGHT OOPHORECTOMY  02/20/1969    FAMILY HISTORY Family History  Problem Relation Age of Onset   Heart disease Mother        deceased   Heart disease Father        deceased, heart disease   Diabetes Brother    Heart disease Brother    Thyroid disease Brother    Heart disease Sister    Heart disease Brother    Thyroid disease Brother    Lupus Daughter    Colon cancer Neg Hx     SOCIAL HISTORY Social History   Tobacco Use   Smoking status: Never    Passive exposure: Never   Smokeless tobacco: Never   Tobacco comments:    Never smoked  Vaping Use   Vaping Use: Never used  Substance Use Topics   Alcohol use: No    Alcohol/week: 0.0 standard drinks of alcohol   Drug use: No         OPHTHALMIC EXAM:  Base Eye Exam     Visual Acuity (Snellen - Linear)        Right Left   Dist cc 20/50 CF at face   Dist ph cc NI     Correction: Glasses         Tonometry (Tonopen, 9:03 AM)       Right Left   Pressure 12 10         Pupils       Pupils APD   Right PERRL None   Left PERRL None  Visual Fields       Left Right     Full   Restrictions Total superior temporal, inferior temporal, inferior nasal deficiencies          Extraocular Movement       Right Left    Full, Ortho Full, Ortho         Neuro/Psych     Oriented x3: Yes   Mood/Affect: Normal         Dilation     Right eye: 2.5% Phenylephrine, 1.0% Mydriacyl @ 9:03 AM           Slit Lamp and Fundus Exam     External Exam       Right Left   External Normal Normal         Slit Lamp Exam       Right Left   Lids/Lashes Normal Normal   Conjunctiva/Sclera White and quiet White and quiet   Cornea Clear Clear   Anterior Chamber Deep and quiet Deep and quiet   Iris Round and reactive Round and reactive   Lens Posterior chamber intraocular lens Posterior chamber intraocular lens   Anterior Vitreous Normal, Normal         Fundus Exam       Right Left   Posterior Vitreous Clear and avitric, no debris visible    Disc Normal    C/D Ratio 0.2    Macula Hard drusen, , Microaneurysms, Retinal pigment epithelial mottling, no hemorrhage, no exudates, no cystoid macular edema, no macular thickening    Vessels Old branch retinal vein occlusion    Periphery Normal,  , no retinal hole, no retinal tear, good peripheral PRP sector             IMAGING AND PROCEDURES  Imaging and Procedures for 02/16/22  OCT, Retina - OU - Both Eyes       Right Eye Quality was good. Scan locations included subfoveal. Central Foveal Thickness: 203. Progression has improved. Findings include abnormal foveal contour.   Left Eye Quality was good. Scan locations included subfoveal. Central Foveal Thickness: 222. Progression has been stable. Findings include  disciform scar, subretinal scarring.   Notes Much improved and vastly improved CME from [facility], with underlying diabetic retinopathy post vitrectomy for floaters, with slight recurrence may be associated with evanescent blood pressure issues currently.  Now maintained on 5-week injection post Eylea.  We will repeat injection today and maintain 5-week follow-up OD  OS, no active maculopathy, with diffuse atrophy, cicatricial scarring Accounts for acuity     Intravitreal Injection, Pharmacologic Agent - OD - Right Eye       Time Out 02/16/2022. 9:28 AM. Confirmed correct patient, procedure, site, and patient consented.   Anesthesia Topical anesthesia was used. Anesthetic medications included Lidocaine 4%.   Procedure Preparation included 5% betadine to ocular surface, 10% betadine to eyelids, Tobramycin 0.3%. A 30 gauge needle was used.   Injection: 2 mg aflibercept 2 MG/0.05ML   Route: Intravitreal, Site: Right Eye   NDC: [id number], Lot: [id number], Expiration date: 02/22/2023, Waste: 0 mL   Post-op Post injection exam found visual acuity of at least counting fingers. The patient tolerated the procedure well. There were no complications. The patient received written and verbal post procedure care education. Post injection medications included ocuflox.              ASSESSMENT/PLAN:  Branch retinal vein occlusion with macular edema of right eye Vastly improved OD CME and upon change from  Avastin to [facility].  Now at 5-week follow-up interval.  We will extend interval examination after injection today  to 8 weeks  Early stage nonexudative age-related macular degeneration of right eye No sign of CNVM  Severe nonproliferative diabetic retinopathy of right eye, with macular edema, associated with type 2 diabetes mellitus ([place]) Stable at this time  Vitreous membranes and strands, right Improved post vitrectomy      ICD-10-CM   1. Branch retinal vein occlusion with macular  edema of right eye  H34.8310 OCT, Retina - OU - Both Eyes    Intravitreal Injection, Pharmacologic Agent - OD - Right Eye    aflibercept (EYLEA) SOLN 2 mg    2. Early stage nonexudative age-related macular degeneration of right eye  H35.3111     3. Severe nonproliferative diabetic retinopathy of right eye, with macular edema, associated with type 2 diabetes mellitus ([place])  E11.3411     4. Vitreous membranes and strands, right  H43.311       OD doing very well post injection Eylea with much less CME from [facility], stable acuity as well.  2.  No sign of progression of severe NPDR likely also controlled by use of antivegF medication for [facility]  3.  Vitreous membranes and strands OD resolved  Ophthalmic Meds Ordered this visit:  Meds ordered this encounter  Medications   aflibercept (EYLEA) SOLN 2 mg       Return in about 8 weeks (around 04/13/2022) for dilate, OD, EYLEA OCT.  There are no Patient Instructions on file for this visit.   Explained the diagnoses, plan, and follow up with the patient and they expressed understanding.  Patient expressed understanding of the importance of proper follow up care.   Clent Demark Deone Leifheit M.D. Diseases & Surgery of the Retina and Vitreous Retina [facility] Diabetic [place] 02/16/22     Abbreviations: M myopia (nearsighted); A astigmatism; H hyperopia (farsighted); P presbyopia; Mrx spectacle prescription;  CTL contact lenses; OD right eye; OS left eye; OU both eyes  XT exotropia; ET esotropia; PEK punctate epithelial keratitis; PEE punctate epithelial erosions; DES dry eye syndrome; MGD meibomian gland dysfunction; ATs artificial tears; PFAT's preservative free artificial tears; [place] nuclear sclerotic cataract; PSC posterior subcapsular cataract; ERM epi-retinal membrane; PVD posterior vitreous detachment; RD retinal detachment; DM diabetes mellitus; DR diabetic retinopathy; NPDR non-proliferative diabetic retinopathy; PDR proliferative diabetic retinopathy;  CSME clinically significant macular edema; DME diabetic macular edema; dbh dot blot hemorrhages; CWS cotton wool spot; POAG primary open angle glaucoma; C/D cup-to-disc ratio; HVF humphrey visual field; GVF goldmann visual field; OCT optical coherence tomography; IOP intraocular pressure; BR[facility] Branch retinal vein occlusion; CRVO central retinal vein occlusion; CRAO central retinal artery occlusion; BRAO branch retinal artery occlusion; RT retinal tear; SB scleral buckle; PPV pars plana vitrectomy; VH Vitreous hemorrhage; PRP panretinal laser photocoagulation; IVK intravitreal kenalog; VMT vitreomacular traction; MH Macular hole;  NVD neovascularization of the disc; NVE neovascularization elsewhere; AREDS age related eye disease study; ARMD age related macular degeneration; POAG primary open angle glaucoma; EBMD epithelial/anterior basement membrane dystrophy; ACIOL anterior chamber intraocular lens; IOL intraocular lens; PCIOL posterior chamber intraocular lens; Phaco/IOL phacoemulsification with intraocular lens placement; [place] photorefractive keratectomy; LASIK laser assisted in situ keratomileusis; HTN hypertension; DM diabetes mellitus; COPD chronic obstructive pulmonary disease

## 2022-02-16 NOTE — Assessment & Plan Note (Addendum)
Vastly improved OD CME and upon change from Avastin to [facility].  Now at 5-week follow-up interval.  We will extend interval examination after injection today  to 8 weeks

## 2022-02-16 NOTE — Assessment & Plan Note (Signed)
No sign of CNVM 

## 2022-02-19 NOTE — Progress Notes (Signed)
Cardiology Office Note  Date: 02/20/2022   ID: Virginia, Huber [birth year], MRN [id number]  PCP:  Sharilyn Sites, MD  Cardiologist:  Rozann Lesches, MD Electrophysiologist:  Constance Haw, MD   Chief Complaint  Patient presents with   Cardiac follow-up    History of Present Illness: Virginia Huber is a 76 y.o. female last seen in July by Ms. Vita Barley, I reviewed the note.  She is here for a routine visit today. She has had trouble with orthostatic hypotension resulting in discontinuation of several of her medications as documented in the last note.  She tells me that she feels much better, has not had any recurrent events since last visit.  We went over her home blood pressure checks with systolics generally under 220.  She has had some systolics in the mid to upper 90s but not symptomatic.  We went over her medications and discussed continuing current dose of Coreg realizing that we could cut this back 1 more time if necessary.  Not clear that we need to consider midodrine at this point.  Medtronic pacemaker in place with follow-up by Dr. Curt Bears.  Device interrogation in July showed well under 1% AT/AF burden.  She does not report any palpitations.  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 h/o PAF/flutter during admission in 2013 for PNA. b. PAF during adm for NSTEMI 07/2015, subsequent paroxysms since then.   B12 deficiency anemia    Branch retinal vein occlusion with macular edema of right eye 10/11/2019   Chronic renal failure, stage 3b ([place]) 09/23/2017   Coronary artery disease 11/30/2014   a. remote MI. b. h/o PTCA with scoring balloon to OM1 11/2014. c. NSTEMI  03/2015 s/p DES to prox-mid Cx. d. NSTEMI 07/2015 s/p scoring balloon/PTCA/DES to dRCA with PAF during that admission   Cutaneous lupus erythematosus    Early stage nonexudative age-related macular degeneration of right eye 02/27/2020   Essential hypertension    GERD (gastroesophageal reflux disease)    History of blood transfusion 1980's   2nd surgical procedures   Hypercholesteremia    Hypothyroidism    Myocardial infarction ([place]) 02/2012   NSTEMI (non-ST elevated myocardial infarction) ([place]) 04/02/2015   OSA (obstructive sleep apnea) 05/13/2016   Ovarian tumor    PAD (peripheral artery disease) ([place])    a. s/p LE angio 2015; followed by Dr. Fletcher Anon - managed medically.   Pericardial effusion    a. 06/2016 after ppm - s/p pericardiocentesis.   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 s/p Medtronic PPM 06/2016, c/b lead perf/pericardial effusion.   TIA (transient ischemic attack)    Type 2 diabetes with nephropathy ([place]) 02/29/2012    Past Surgical History:  Procedure Laterality Date   ABDOMINAL AORTAGRAM N/A 01/03/2014   Procedure: ABDOMINAL [name];  Surgeon: Wellington Hampshire, MD;  Location: [facility];  Service: Cardiovascular;  Laterality: N/A;   ABDOMINAL HYSTERECTOMY  06/22/1970   "partial"   APPENDECTOMY  02/20/1969   CARDIAC CATHETERIZATION  06/22/2006   Tiny OM-2 with 90% narrowing. Med tx.   CARDIAC CATHETERIZATION N/A 11/30/2014   Procedure: Left Heart Cath and Coronary Angiography;  Surgeon: Troy Sine, MD; LAD 20%, CFX 50%, OM1 95%, right PLB 30%, LV normal    CARDIAC CATHETERIZATION N/A 11/30/2014   Procedure: Coronary Balloon Angioplasty;   Surgeon: Troy Sine, MD;  Angiosculpt scoring balloon and PTCA to the OM1 reducing stenosis from 95% to less than 10%   CARDIAC CATHETERIZATION N/A 04/03/2015   Procedure: Left Heart Cath and Coronary Angiography;  Surgeon: Jolaine Artist, MD; dLAD 50%, CFX 90%, OM1 100%, PLA 15%, LVEDP 13     CARDIAC CATHETERIZATION N/A 04/03/2015   Procedure: Coronary Stent Intervention;  Surgeon: Sherren Mocha, MD; 3.0x18 mm Xience DES to the CFX     CARDIAC CATHETERIZATION N/A 08/02/2015   Procedure: Left Heart Cath and Coronary Angiography;  Surgeon: Troy Sine, MD;  Location: [facility];  Service: Cardiovascular;  Laterality: N/A;   CARDIAC CATHETERIZATION N/A 08/02/2015   Procedure: Coronary Stent Intervention;  Surgeon: Troy Sine, MD;  Location: [facility];  Service: Cardiovascular;  Laterality: N/A;   CARDIAC CATHETERIZATION N/A 06/25/2016   Procedure: Pericardiocentesis;  Surgeon: Will Meredith Leeds, MD;  Location: [facility];  Service: Cardiovascular;  Laterality: N/A;   cardiac stents     CARDIOVERSION N/A 12/15/2017   Procedure: CARDIOVERSION;  Surgeon: Acie Fredrickson Wonda Cheng, MD;  Location: [facility];  Service: Cardiovascular;  Laterality: N/A;   CHOLECYSTECTOMY OPEN  02/20/1989   COLONOSCOPY  06/23/2003   Dr. Laural Golden: pancolonic divericula, polyp, path unknown currently   COLONOSCOPY  06/22/2010   Dr. Oneida Alar: Normal TI, scattered diverticula in entire colon, small internal hemorrhoids, normal colon biopsies. Colonoscopy in 5-10 years.    COLONOSCOPY WITH PROPOFOL N/A 06/02/2021   pancolonic diverticulosis. Two 4-6 mm polyps in transverse colon. Sessile serrated and hyperplastic. 5 year surveillance if benefits outweigh the risks.   COLOSTOMY  05/23/1979   COLOSTOMY REVERSAL  11/21/1979   EP IMPLANTABLE DEVICE N/A 06/25/2016   Procedure: Lead Revision/Repair;  Surgeon: Will Meredith Leeds, MD;  Location: [facility];  Service: Cardiovascular;   Laterality: N/A;   EP IMPLANTABLE DEVICE N/A 06/25/2016   Procedure: Pacemaker Implant;  Surgeon: Will Meredith Leeds, MD;  Location: [facility];  Service: Cardiovascular;  Laterality: N/A;   EXCISIONAL HEMORRHOIDECTOMY  02/20/1969   EYE SURGERY Left 06/22/1998   "branch vein occlusion"   EYE SURGERY Left ~ 2001   "smoothed out wrinkle"   LEFT OOPHORECTOMY  05/23/1979   nicked bowel, peritonitis, colostomy; colostomy reversed 1981    LOWER EXTREMITY ANGIOGRAM N/A 01/03/2014   Procedure: LOWER EXTREMITY ANGIOGRAM;  Surgeon: Wellington Hampshire, MD;  Location: [facility];  Service: Cardiovascular;  Laterality: N/A;   Nuclear med stress test  10/21/2011   Small area of mild ischemia inferoapically.   PARTIAL HYSTERECTOMY  02/20/1969   left ovaries, then ovaries removed later due tumors    POLYPECTOMY  06/02/2021   Procedure: POLYPECTOMY;  Surgeon: Eloise Harman, DO;  [facility];  Service: Endoscopy;;   right eye surgery  03/2021   RIGHT OOPHORECTOMY  02/20/1969    Current Outpatient Medications  Medication Sig Dispense Refill   acetaminophen (TYLENOL) 325 MG tablet Take 650 mg by mouth every 6 (  six) hours as needed for headache.     amiodarone (PACERONE) 200 MG tablet Take 0.5 tablets (100 mg total) by mouth daily. 45 tablet 1   apixaban (ELIQUIS) 5 MG TABS tablet Take 1 tablet (5 mg total) by mouth 2 (two) times daily. 30 tablet 5   atorvastatin (LIPITOR) 80 MG tablet TAKE ONE TABLET BY MOUTH EVERY DAY AT 6:00PM 90 tablet 3   calcitRIOL (ROCALTROL) 0.25 MCG capsule Take 0.25 mcg by mouth 3 (three) times a week.     carvedilol (COREG) 6.25 MG tablet Take 1 tablet (6.25 mg total) by mouth 2 (two) times daily. 60 tablet 3   Cholecalciferol (VITAMIN D3 PO) Take 2,000 Units by mouth every evening.     clopidogrel (PLAVIX) 75 MG tablet TAKE ONE TABLET ('75MG'$  TOTAL) BY MOUTH DAILY 90 tablet 3   Continuous Blood Gluc Receiver (FREESTYLE LIBRE 2 READER) DEVI 1 each by Does  not apply route daily. 1 each 0   Continuous Blood Gluc Sensor (FREESTYLE LIBRE 2 SENSOR) MISC USE ONE FOR FOURTEEN DAYS 6 each 3   glucose blood (FREESTYLE PRECISION NEO TEST) test strip Use as instructed 100 each 3   insulin NPH-regular Human (NOVOLIN 70/30) (70-30) 100 UNIT/ML injection INJECT 25 UNITS BEFORE BREAKFAST AND 15 UNITS BEFORE SUPPER 45 mL 3   levothyroxine (SYNTHROID) 112 MCG tablet TAKE ONE TABLET BY MOUTH ONCE DAILY 90 tablet 0   nitroGLYCERIN (NITROSTAT) 0.4 MG SL tablet Place 1 tablet (0.4 mg total) under the tongue every 5 (five) minutes as needed for chest pain. 25 tablet 0   rOPINIRole (REQUIP) 0.5 MG tablet Take 0.5 mg by mouth at bedtime.   0   Semaglutide, 2 MG/DOSE, 8 MG/3ML SOPN Inject 2 mg as directed once a week. 9 mL 3   No current facility-administered medications for this visit.   Allergies:  Penicillins and Percocet [oxycodone-acetaminophen]   ROS: No syncope.  Physical Exam: VS:  BP (!) 144/90   Pulse 83   Ht '5\' 3"'$  (1.6 m)   Wt 133 lb 6.4 oz (60.5 kg)   SpO2 98%   BMI 23.63 kg/m , BMI Body mass index is 23.63 kg/m.  Wt Readings from Last 3 Encounters:  02/20/22 133 lb 6.4 oz (60.5 kg)  02/09/22 137 lb (62.1 kg)  01/13/22 138 lb (62.6 kg)    General: Patient appears comfortable at rest. HEENT: Conjunctiva and lids normal. Neck: Supple, no elevated JVP or carotid bruits, no thyromegaly. Lungs: Clear to auscultation, nonlabored breathing at rest. Cardiac: Regular rate and rhythm, no S3 or significant systolic murmur. Extremities: No pitting edema.  ECG:  An ECG dated 08/25/2021 was personally reviewed today and demonstrated:  Atrial paced rhythm.  Recent Labwork: 05/29/2021: BUN 25; Creatinine, Ser 1.58; Potassium 4.0; Sodium 140 08/25/2021: ALT 21; AST 12 10/09/2021: TSH 1.18 11/05/2021: Hemoglobin 13.9; Platelets 295  January 2023: Cholesterol 126, triglycerides 160, HDL 41, LDL 58  Other Studies Reviewed Today:  Lexiscan Myoview  01/20/2018: Downsloping ST segment depression ST segment depression of 0.5 mm was noted during stress in the II, III, aVF, V5 and V6 leads. Defect 1: There is a small defect of mild severity present in the apical anterior and apical septal location. This likely represents soft tissue attenuation. A small degree of scar cannot entirely be excluded. No signficant ischemia. This is a low risk study. Nuclear stress EF: 64%.   Echocardiogram 08/26/2019:  1. Left ventricular ejection fraction, by estimation, is 55 to 60%. The  left ventricle has normal function. The left ventricle has no regional  wall motion abnormalities. There is moderate concentric left ventricular  hypertrophy. Left ventricular  diastolic function could not be evaluated. Left ventricular diastolic  function could not be evaluated. Elevated left ventricular end-diastolic  pressure.   2. Right ventricular systolic function is normal. The right ventricular  size is normal. There is mildly elevated pulmonary artery systolic  pressure.   3. Left atrial size was severely dilated.   4. The mitral valve is normal in structure and function. Trivial mitral  valve regurgitation. No evidence of mitral stenosis.   5. The aortic valve is tricuspid. Aortic valve regurgitation is not  visualized. No aortic stenosis is present.   6. The inferior vena cava is normal in size with <50% respiratory  variability, suggesting right atrial pressure of 8 mmHg.  Assessment and Plan:  1.  Orthostatic hypotension, doing better following simplification of medicines as discussed in the last note.  She is only on Coreg at this time with concurrent diagnosis of paroxysmal atrial fibrillation and CAD.  No changes were made today, we could cut Coreg back to 3.125 mg twice daily if necessary.  No clear indication for midodrine at this point.  2.  Paroxysmal atrial fibrillation with CHA2DS2-VASc score of 8.  She has very low rhythm burden based on pacer  interrogation and remains on Eliquis for stroke prophylaxis.  I reviewed her lab work including LFTs and TSH earlier this year.  She is on low-dose amiodarone for rhythm suppression.  Nearing potential change to Eliquis 2.5 mg twice daily dose.  3.  CAD status post angioplasty of the OM1 in 2016, DES to the circumflex in 2016, and DES to the RCA in 2017.  She does not report any active angina and we continue with observation.  Continue Coreg, Plavix, and Lipitor.  4.  CKD stage IIIb, creatinine 1.58.  She follows with nephrology.  5.  Tachycardia-bradycardia syndrome with Medtronic pacemaker in place and followed by Dr. Curt Bears.  Medication Adjustments/Labs and Tests Ordered: Current medicines are reviewed at length with the patient today.  Concerns regarding medicines are outlined above.   Tests Ordered: No orders of the defined types were placed in this encounter.   Medication Changes: No orders of the defined types were placed in this encounter.   Disposition:  Follow up  3 months.  Signed, Satira Sark, MD, [facility] 02/20/2022 9:48 AM    [facility] [address], [place], [place] [address] Phone: [phone]; Fax: [phone]

## 2022-02-20 ENCOUNTER — Ambulatory Visit: Payer: HMO | Attending: Cardiology | Admitting: Cardiology

## 2022-02-20 ENCOUNTER — Encounter: Payer: Self-pay | Admitting: Cardiology

## 2022-02-20 VITALS — BP 144/90 | HR 83 | Ht 63.0 in | Wt 133.4 lb

## 2022-02-20 DIAGNOSIS — I5032 Chronic diastolic (congestive) heart failure: Secondary | ICD-10-CM | POA: Diagnosis not present

## 2022-02-20 DIAGNOSIS — N1832 Chronic kidney disease, stage 3b: Secondary | ICD-10-CM | POA: Diagnosis not present

## 2022-02-20 DIAGNOSIS — E1122 Type 2 diabetes mellitus with diabetic chronic kidney disease: Secondary | ICD-10-CM | POA: Diagnosis not present

## 2022-02-20 DIAGNOSIS — E559 Vitamin D deficiency, unspecified: Secondary | ICD-10-CM | POA: Diagnosis not present

## 2022-02-20 DIAGNOSIS — E1129 Type 2 diabetes mellitus with other diabetic kidney complication: Secondary | ICD-10-CM | POA: Diagnosis not present

## 2022-02-20 DIAGNOSIS — N189 Chronic kidney disease, unspecified: Secondary | ICD-10-CM | POA: Diagnosis not present

## 2022-02-20 DIAGNOSIS — I480 Paroxysmal atrial fibrillation: Secondary | ICD-10-CM

## 2022-02-20 DIAGNOSIS — I951 Orthostatic hypotension: Secondary | ICD-10-CM | POA: Diagnosis not present

## 2022-02-20 DIAGNOSIS — E211 Secondary hyperparathyroidism, not elsewhere classified: Secondary | ICD-10-CM | POA: Diagnosis not present

## 2022-02-20 DIAGNOSIS — I25119 Atherosclerotic heart disease of native coronary artery with unspecified angina pectoris: Secondary | ICD-10-CM

## 2022-02-20 DIAGNOSIS — I129 Hypertensive chronic kidney disease with stage 1 through stage 4 chronic kidney disease, or unspecified chronic kidney disease: Secondary | ICD-10-CM | POA: Diagnosis not present

## 2022-02-20 DIAGNOSIS — R809 Proteinuria, unspecified: Secondary | ICD-10-CM | POA: Diagnosis not present

## 2022-02-20 NOTE — Patient Instructions (Signed)
Medication Instructions:  ?Your physician recommends that you continue on your current medications as directed. Please refer to the Current Medication list given to you today. ? ? ?Labwork: ?None today ? ?Testing/Procedures: ?None today ? ?Follow-Up: ?3 months ? ?Any Other Special Instructions Will Be Listed Below (If Applicable). ? ?If you need a refill on your cardiac medications before your next appointment, please call your pharmacy. ? ?

## 2022-02-26 DIAGNOSIS — N189 Chronic kidney disease, unspecified: Secondary | ICD-10-CM | POA: Diagnosis not present

## 2022-02-26 DIAGNOSIS — I5032 Chronic diastolic (congestive) heart failure: Secondary | ICD-10-CM | POA: Diagnosis not present

## 2022-02-26 DIAGNOSIS — I129 Hypertensive chronic kidney disease with stage 1 through stage 4 chronic kidney disease, or unspecified chronic kidney disease: Secondary | ICD-10-CM | POA: Diagnosis not present

## 2022-02-26 DIAGNOSIS — E1129 Type 2 diabetes mellitus with other diabetic kidney complication: Secondary | ICD-10-CM | POA: Diagnosis not present

## 2022-02-26 DIAGNOSIS — E211 Secondary hyperparathyroidism, not elsewhere classified: Secondary | ICD-10-CM | POA: Diagnosis not present

## 2022-02-26 DIAGNOSIS — R809 Proteinuria, unspecified: Secondary | ICD-10-CM | POA: Diagnosis not present

## 2022-02-26 DIAGNOSIS — E1122 Type 2 diabetes mellitus with diabetic chronic kidney disease: Secondary | ICD-10-CM | POA: Diagnosis not present

## 2022-03-04 ENCOUNTER — Encounter: Payer: Self-pay | Admitting: Orthopedic Surgery

## 2022-03-04 NOTE — Progress Notes (Signed)
Office Visit Note   Patient: Virginia Huber           Date of Birth: 1946-03-05           MRN: [id number] Visit Date: 02/10/2022              Requested by: Sharilyn Sites, [place] [place],  [place] [address] PCP: Sharilyn Sites, MD  Chief Complaint  Patient presents with   Left Foot - Follow-up    Left foot 5th MT fx  f/u       HPI: Patient is a 76 year old woman who has been using a rolling walker in a postoperative shoe for 1/5 metatarsal distal shaft fracture.  Assessment & Plan: Visit Diagnoses:  1. Closed nondisplaced fracture of fifth metatarsal bone of left foot, initial encounter     Plan: Patient will advance her activities as tolerated with weightbearing as tolerated.  Follow-Up Instructions: Return if symptoms worsen or fail to improve.   Ortho Exam  Patient is alert, oriented, no adenopathy, well-dressed, normal affect, normal respiratory effort. Examination the radiographs patient does have significant peripheral vascular disease with calcification of the arteries out to her toes.  She has no tenderness to palpation over the fracture site.  Imaging: No results found. No images are attached to the encounter.  Labs: Lab Results  Component Value Date   HGBA1C 6.0 (A) 02/09/2022   HGBA1C 6.7 (A) 10/09/2021   HGBA1C 7.5 (A) 06/06/2021   REPTSTATUS 07/10/2019 FINAL 07/08/2019   GRAMSTAIN  03/01/2012    MODERATE WBC PRESENT,BOTH PMN AND MONONUCLEAR RARE SQUAMOUS EPITHELIAL CELLS PRESENT FEW GRAM POSITIVE COCCI IN PAIRS FEW GRAM NEGATIVE RODS   CULT (A) 07/08/2019    <10,000 COLONIES/mL INSIGNIFICANT GROWTH Performed at [facility], [address], [place], [place] [address]    LABORGA NO GROWTH 06/12/2013     Lab Results  Component Value Date   ALBUMIN 4.2 08/25/2021   ALBUMIN 4.4 02/25/2021   ALBUMIN 4.4 08/22/2020    Lab Results  Component Value Date   MG 1.9 03/13/2016   MG 1.6 (L) 09/18/2015   MG 1.9 08/19/2015    Lab Results  Component Value Date   VD25OH 22 (L) 08/05/2017    No results found for: "PREALBUMIN"    Latest Ref Rng & Units 11/05/2021    9:17 AM 05/29/2021   11:47 AM 08/22/2020    4:47 PM  CBC EXTENDED  WBC 3.4 - 10.8 x10E3/uL 10.5  9.1  8.5   RBC 3.77 - 5.28 x10E6/uL 4.50  4.23  4.06   Hemoglobin 11.1 - 15.9 g/dL 13.9  13.3  12.4   HCT 34.0 - 46.6 % 42.2  41.1  37.7   Platelets 150 - 450 x10E3/uL 295  265  265   NEUT# 1.7 - 7.7 K/uL  7.1    Lymph# 0.7 - 4.0 K/uL  1.2       There is no height or weight on file to calculate BMI.  Orders:  Orders Placed This Encounter  Procedures   XR Foot Complete Left   No orders of the defined types were placed in this encounter.    Procedures: No procedures performed  Clinical Data: No additional findings.  ROS:  All other systems negative, except as noted in the HPI. Review of Systems  Objective: Vital Signs: There were no vitals taken for this visit.  Specialty Comments:  No specialty comments available.  PMFS History: Patient Active Problem List  Diagnosis Date Noted   Stable hemispheric branch retinal vein occlusion (BRVO) of left eye 12/08/2021   History of vitrectomy 12/08/2021   History of colonic polyps 05/13/2021   Vitreous membranes and strands, right 01/21/2021   Advanced nonexudative age-related macular degeneration of left eye with subfoveal involvement 06/26/2020   Stable central retinal vein occlusion of left eye 05/13/2020   Early stage nonexudative age-related macular degeneration of right eye 02/27/2020   OSA on CPAP 02/12/2020   Branch retinal vein occlusion with macular edema of right eye 10/11/2019   Severe nonproliferative diabetic retinopathy of right eye, with macular edema, associated with type 2 diabetes mellitus ([place]) 10/11/2019   Right retinal defect 10/11/2019   Retinal microaneurysm of right eye 10/11/2019   CHF exacerbation ([place]) 08/25/2019   Acute exacerbation of CHF (congestive  heart failure) ([place]) 08/24/2019   Acute on chronic diastolic HF (heart failure) ([place]) 12/17/2017   GERD (gastroesophageal reflux disease) 12/17/2017   Typical atrial flutter (HCC)    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   Paroxysmal atrial fibrillation ([place]) 12/08/2017   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   Chronic renal failure, stage 3b ([place]) 09/23/2017   Fever 09/23/2017   S/P pericardiocentesis 06/28/2016   Acute blood loss anemia 06/28/2016   Pericardial effusion 06/26/2016   Tachy-brady syndrome ([place]) 06/25/2016   Tamponade    Bradycardia 06/14/2016   Junctional bradycardia    Coronary artery disease involving coronary bypass graft of native heart with unstable angina pectoris (HCC)    Acute diastolic CHF (congestive heart failure), NYHA class 3 (HCC)    Systolic congestive heart failure ([place]) 05/13/2016   Multiple and bilateral precerebral artery syndromes 05/13/2016   OSA (obstructive sleep apnea) 05/13/2016   Chronic diarrhea 12/25/2015   Chest pain 08/02/2015   Atrial fibrillation with rapid ventricular response (HCC)    Pain with urination 05/08/2015   NSTEMI (non-ST elevated myocardial infarction) ([place]) 04/02/2015   CAD in native artery 11/30/2014   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    PAD (peripheral artery disease) ([place]) 12/26/2013   Superficial fungus infection of skin 06/29/2013   UTI (urinary tract infection) 05/08/2013   Hypokalemia 03/05/2012   B12 deficiency anemia 03/02/2012   Bronchospasm 03/02/2012   Community acquired bacterial pneumonia 03/01/2012   Acute respiratory failure with hypoxia ([place]) 03/01/2012   Type 2 diabetes with nephropathy ([place]) 02/29/2012   Hypothyroidism 02/28/2012   RLQ abdominal pain 11/24/2010   OVERWEIGHT/OBESITY 06/03/2010   Essential hypertension 06/03/2010   Overweight 06/03/2010   Mixed hyperlipidemia 12/27/2009    Palpitations 05/17/2009   FRACTURE, TOE 12/06/2007   Past Medical History:  Diagnosis Date   (HFpEF) heart failure with preserved ejection fraction ([place])    Advanced nonexudative age-related macular degeneration of left eye with subfoveal involvement 06/26/2020   Ongoing, accounts for acuity   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 h/o PAF/flutter during admission in 2013 for PNA. b. PAF during adm for NSTEMI 07/2015, subsequent paroxysms since then.   B12 deficiency anemia    Branch retinal vein occlusion with macular edema of right eye 10/11/2019   Chronic renal failure, stage 3b ([place]) 09/23/2017   Coronary artery disease 11/30/2014   a. remote MI. b. h/o PTCA with scoring balloon to OM1 11/2014. c. NSTEMI 03/2015 s/p DES to prox-mid Cx. d. NSTEMI 07/2015 s/p scoring balloon/PTCA/DES to  dRCA with PAF during that admission   Cutaneous lupus erythematosus    Early stage nonexudative age-related macular degeneration of right eye 02/27/2020   Essential hypertension    GERD (gastroesophageal reflux disease)    History of blood transfusion 1980's   2nd surgical procedures   Hypercholesteremia    Hypothyroidism    Myocardial infarction ([place]) 02/2012   NSTEMI (non-ST elevated myocardial infarction) ([place]) 04/02/2015   OSA (obstructive sleep apnea) 05/13/2016   Ovarian tumor    PAD (peripheral artery disease) ([place])    a. s/p LE angio 2015; followed by Dr. Fletcher Anon - managed medically.   Pericardial effusion    a. 06/2016 after ppm - s/p pericardiocentesis.   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 s/p  Medtr[employer] PPM 06/2016, c/b lead perf/pericardial effusion.   TIA (transient ischemic attack)    Type 2 diabetes with nephropathy ([place]) 02/29/2012    Family History  Problem Relation Age of Onset   Heart disease Mother        deceased   Heart disease Father        deceased, heart disease   Diabetes Brother    Heart disease Brother    Thyroid disease Brother    Heart disease Sister    Heart disease Brother    Thyroid disease Brother    Lupus Daughter    Colon cancer Neg Hx     Past Surgical History:  Procedure Laterality Date   ABDOMINAL AORTAGRAM N/A 01/03/2014   Procedure: ABDOMINAL [name];  Surgeon: Wellington Hampshire, MD;  Location: [facility];  Service: Cardiovascular;  Laterality: N/A;   ABDOMINAL HYSTERECTOMY  06/22/1970   "partial"   APPENDECTOMY  02/20/1969   CARDIAC CATHETERIZATION  06/22/2006   Tiny OM-2 with 90% narrowing. Med tx.   CARDIAC CATHETERIZATION N/A 11/30/2014   Procedure: Left Heart Cath and Coronary Angiography;  Surgeon: Troy Sine, MD; LAD 20%, CFX 50%, OM1 95%, right PLB 30%, LV normal    CARDIAC CATHETERIZATION N/A 11/30/2014   Procedure: Coronary Balloon Angioplasty;  Surgeon: Troy Sine, MD;  Angiosculpt scoring balloon and PTCA to the OM1 reducing stenosis from 95% to less than 10%   CARDIAC CATHETERIZATION N/A 04/03/2015   Procedure: Left Heart Cath and Coronary Angiography;  Surgeon: Jolaine Artist, MD; dLAD 50%, CFX 90%, OM1 100%, PLA 15%, LVEDP 13     CARDIAC CATHETERIZATION N/A 04/03/2015   Procedure: Coronary Stent Intervention;  Surgeon: Sherren Mocha, MD; 3.0x18 mm Xience DES to the CFX     CARDIAC CATHETERIZATION N/A 08/02/2015   Procedure: Left Heart Cath and Coronary Angiography;  Surgeon: Troy Sine, MD;  Location: [facility];  Service: Cardiovascular;  Laterality: N/A;   CARDIAC CATHETERIZATION N/A 08/02/2015   Procedure: Coronary Stent Intervention;  Surgeon: Troy Sine, MD;  Location: [facility];   Service: Cardiovascular;  Laterality: N/A;   CARDIAC CATHETERIZATION N/A 06/25/2016   Procedure: Pericardiocentesis;  Surgeon: Will Meredith Leeds, MD;  Location: [facility];  Service: Cardiovascular;  Laterality: N/A;   cardiac stents     CARDIOVERSION N/A 12/15/2017   Procedure: CARDIOVERSION;  Surgeon: Acie Fredrickson Wonda Cheng, MD;  Location: [facility];  Service: Cardiovascular;  Laterality: N/A;   CHOLECYSTECTOMY OPEN  02/20/1989   COLONOSCOPY  06/23/2003   Dr. Laural Golden: pancolonic divericula, polyp, path unknown currently   COLONOSCOPY  06/22/2010   Dr. Oneida Alar: Normal TI, scattered diverticula in entire colon, small internal hemorrhoids, normal colon biopsies. Colonoscopy in 5-10 years.    COLONOSCOPY WITH PROPOFOL N/A 06/02/2021   pancolonic diverticulosis. Two 4-6 mm polyps in transverse colon. Sessile serrated and hyperplastic. 5 year surveillance if benefits outweigh the risks.   COLOSTOMY  05/23/1979   COLOSTOMY REVERSAL  11/21/1979   EP IMPLANTABLE DEVICE N/A 06/25/2016   Procedure: Lead Revision/Repair;  Surgeon: Will Meredith Leeds, MD;  Location: [facility];  Service: Cardiovascular;  Laterality: N/A;   EP IMPLANTABLE DEVICE N/A 06/25/2016   Procedure: Pacemaker Implant;  Surgeon: Will Meredith Leeds, MD;  Location: [facility];  Service: Cardiovascular;  Laterality: N/A;   EXCISIONAL HEMORRHOIDECTOMY  02/20/1969   EYE SURGERY Left 06/22/1998   "branch vein occlusion"   EYE SURGERY Left ~ 2001   "smoothed out wrinkle"   LEFT OOPHORECTOMY  05/23/1979   nicked bowel, peritonitis, colostomy; colostomy reversed 1981    LOWER EXTREMITY ANGIOGRAM N/A 01/03/2014   Procedure: LOWER EXTREMITY ANGIOGRAM;  Surgeon: Wellington Hampshire, MD;  Location: [facility];  Service: Cardiovascular;  Laterality: N/A;   Nuclear med stress test  10/21/2011   Small area of mild ischemia inferoapically.   PARTIAL HYSTERECTOMY  02/20/1969   left ovaries, then ovaries removed later due  tumors    POLYPECTOMY  06/02/2021   Procedure: POLYPECTOMY;  Surgeon: Eloise Harman, DO;  [facility];  Service: Endoscopy;;   right eye surgery  03/2021   RIGHT OOPHORECTOMY  02/20/1969   Social History   Occupational History   Occupation: Retired    Fish farm manager: RETIRED    Comment: Research officer, political party  Tobacco Use   Smoking status: Never    Passive exposure: Never   Smokeless tobacco: Never   Tobacco comments:    Never smoked  Vaping Use   Vaping Use: Never used  Substance and Sexual Activity   Alcohol use: No    Alcohol/week: 0.0 standard drinks of alcohol   Drug use: No   Sexual activity: Never    Birth control/protection: Surgical    Comment: hyst

## 2022-03-10 ENCOUNTER — Ambulatory Visit: Payer: HMO | Admitting: Podiatry

## 2022-03-12 DIAGNOSIS — E211 Secondary hyperparathyroidism, not elsewhere classified: Secondary | ICD-10-CM | POA: Diagnosis not present

## 2022-03-12 DIAGNOSIS — I129 Hypertensive chronic kidney disease with stage 1 through stage 4 chronic kidney disease, or unspecified chronic kidney disease: Secondary | ICD-10-CM | POA: Diagnosis not present

## 2022-03-12 DIAGNOSIS — E1122 Type 2 diabetes mellitus with diabetic chronic kidney disease: Secondary | ICD-10-CM | POA: Diagnosis not present

## 2022-03-12 DIAGNOSIS — N189 Chronic kidney disease, unspecified: Secondary | ICD-10-CM | POA: Diagnosis not present

## 2022-03-12 DIAGNOSIS — E1129 Type 2 diabetes mellitus with other diabetic kidney complication: Secondary | ICD-10-CM | POA: Diagnosis not present

## 2022-03-12 DIAGNOSIS — I5032 Chronic diastolic (congestive) heart failure: Secondary | ICD-10-CM | POA: Diagnosis not present

## 2022-03-12 DIAGNOSIS — R809 Proteinuria, unspecified: Secondary | ICD-10-CM | POA: Diagnosis not present

## 2022-03-17 ENCOUNTER — Encounter: Payer: Self-pay | Admitting: Cardiology

## 2022-03-17 ENCOUNTER — Ambulatory Visit: Payer: HMO | Attending: Cardiology | Admitting: Cardiology

## 2022-03-17 VITALS — BP 128/74 | HR 80 | Ht 63.0 in | Wt 133.2 lb

## 2022-03-17 DIAGNOSIS — D6869 Other thrombophilia: Secondary | ICD-10-CM | POA: Diagnosis not present

## 2022-03-17 DIAGNOSIS — I480 Paroxysmal atrial fibrillation: Secondary | ICD-10-CM | POA: Diagnosis not present

## 2022-03-17 DIAGNOSIS — Z79899 Other long term (current) drug therapy: Secondary | ICD-10-CM | POA: Diagnosis not present

## 2022-03-17 DIAGNOSIS — I495 Sick sinus syndrome: Secondary | ICD-10-CM | POA: Diagnosis not present

## 2022-03-17 NOTE — Patient Instructions (Addendum)
Medication Instructions:  Your physician has recommended you make the following change in your medication:   HOLD your Carvedilol for the next 2 weeks.  Let us know how you are doing/feeling at the end of those 2 weeks.  *If you need a refill on your cardiac medications before your next appointment, please call your pharmacy*   Lab Work: Amiodarone & Eliquis surveillance labs today:  CMET, CBC & TSH  If you have labs (blood work) drawn today and your tests are completely normal, you will receive your results only by: [place] (if you have MyChart) OR A paper copy in the mail If you have any lab test that is abnormal or we need to change your treatment, we will call you to review the results.   Testing/Procedures: None ordered   Follow-Up: At [facility], you and your health needs are our priority.  As part of our continuing mission to provide you with exceptional heart care, we have created designated Provider Care Teams.  These Care Teams include your primary Cardiologist (physician) and Advanced Practice Providers (APPs -  Physician Assistants and Nurse Practitioners) who all work together to provide you with the care you need, when you need it.   Your next appointment:   6 month(s)  The format for your next appointment:   In Person  Provider:   You will see one of the following Advanced Practice Providers on your designated Care Team:   Tommye Standard, [place] Legrand Como "Jonni Sanger" Chalmers Cater, [place]    Thank you for choosing [facility]!!   Trinidad Curet, RN [phone]  Other Instructions   Important Information About Sugar

## 2022-03-17 NOTE — Progress Notes (Signed)
Electrophysiology Office Note   Date:  03/17/2022   ID:  [name], [name] [birth year], MRN [id number]  PCP:  Sharilyn Sites, MD  Cardiologist:  Bronson Ing Primary Electrophysiologist:  Rylann Munford Meredith Leeds, MD    No chief complaint on file.     History of Present Illness: Virginia Huber is a 76 y.o. female who presents today for electrophysiology evaluation.     She has a history significant for coronary artery disease status post PCI to the OM1 with STEMI in 2016, PCI to the circumflex with non-STEMI in 2017, PCI to the RCA.  She has atrial fibrillation during admission.  Currently on Eliquis.  She has mild sleep apnea.  She was hospitalized December 2017 with atrial fibrillation rapid rates.  She was cardioverted to junctional rhythm.  She is now status post Medtronic dual-chamber pacemaker.  Today, denies symptoms of palpitations, chest pain, shortness of breath, orthopnea, PND, lower extremity edema, claudication, presyncope, syncope, bleeding, or neurologic sequela. The patient is tolerating medications without difficulties.  Since being seen she has done well.  She has had no atrial fibrillation.  Her main concern is dizziness.  She feels dizzy a few times a week.  At times these occur when she is going from a seated to a standing position, but it can occur at other times as well.  She has been holding medications, which is helped somewhat but has not completely improved her symptoms.  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 h/o PAF/flutter during admission in 2013 for PNA. b. PAF during adm for NSTEMI 07/2015, subsequent paroxysms since then.   B12 deficiency anemia     Branch retinal vein occlusion with macular edema of right eye 10/11/2019   Chronic renal failure, stage 3b ([place]) 09/23/2017   Coronary artery disease 11/30/2014   a. remote MI. b. h/o PTCA with scoring balloon to OM1 11/2014. c. NSTEMI 03/2015 s/p DES to prox-mid Cx. d. NSTEMI 07/2015 s/p scoring balloon/PTCA/DES to dRCA with PAF during that admission   Cutaneous lupus erythematosus    Early stage nonexudative age-related macular degeneration of right eye 02/27/2020   Essential hypertension    GERD (gastroesophageal reflux disease)    History of blood transfusion 1980's   2nd surgical procedures   Hypercholesteremia    Hypothyroidism    Myocardial infarction ([place]) 02/2012   NSTEMI (non-ST elevated myocardial infarction) ([place]) 04/02/2015   OSA (obstructive sleep apnea) 05/13/2016   Ovarian tumor    PAD (peripheral artery disease) ([place])    a. s/p LE angio 2015; followed by Dr. Fletcher Anon - managed medically.   Pericardial effusion    a. 06/2016 after ppm - s/p pericardiocentesis.   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 s/p Medtronic PPM 06/2016, c/b lead perf/pericardial effusion.   TIA (transient ischemic attack)    Type 2 diabetes with nephropathy ([place]) 02/29/2012   Past Surgical  History:  Procedure Laterality Date   ABDOMINAL AORTAGRAM N/A 01/03/2014   Procedure: ABDOMINAL AORTAGRAM;  Surgeon: Wellington Hampshire, MD;  Location: [facility] CATH LAB;  Service: Cardiovascular;  Laterality: N/A;   ABDOMINAL HYSTERECTOMY  06/22/1970   "partial"   APPENDECTOMY  02/20/1969   CARDIAC CATHETERIZATION  06/22/2006   Tiny OM-2 with 90% narrowing. Med tx.   CARDIAC CATHETERIZATION N/A  11/30/2014   Procedure: Left Heart Cath and Coronary Angiography;  Surgeon: Troy Sine, MD; LAD 20%, CFX 50%, OM1 95%, right PLB 30%, LV normal    CARDIAC CATHETERIZATION N/A 11/30/2014   Procedure: Coronary Balloon Angioplasty;  Surgeon: Troy Sine, MD;  Angiosculpt scoring balloon and PTCA to the OM1 reducing stenosis from 95% to less than 10%   CARDIAC CATHETERIZATION N/A 04/03/2015   Procedure: Left Heart Cath and Coronary Angiography;  Surgeon: Jolaine Artist, MD; dLAD 50%, CFX 90%, OM1 100%, PLA 15%, LVEDP 13     CARDIAC CATHETERIZATION N/A 04/03/2015   Procedure: Coronary Stent Intervention;  Surgeon: Sherren Mocha, MD; 3.0x18 mm Xience DES to the CFX     CARDIAC CATHETERIZATION N/A 08/02/2015   Procedure: Left Heart Cath and Coronary Angiography;  Surgeon: Troy Sine, MD;  Location: [facility] CV LAB;  Service: Cardiovascular;  Laterality: N/A;   CARDIAC CATHETERIZATION N/A 08/02/2015   Procedure: Coronary Stent Intervention;  Surgeon: Troy Sine, MD;  Location: [facility] CV LAB;  Service: Cardiovascular;  Laterality: N/A;   CARDIAC CATHETERIZATION N/A 06/25/2016   Procedure: Pericardiocentesis;  Surgeon: Lorayne Getchell Meredith Leeds, MD;  Location: [facility] CV LAB;  Service: Cardiovascular;  Laterality: N/A;   cardiac stents     CARDIOVERSION N/A 12/15/2017   Procedure: CARDIOVERSION;  Surgeon: Acie Fredrickson Wonda Cheng, MD;  Location: [facility] ENDOSCOPY;  Service: Cardiovascular;  Laterality: N/A;   CHOLECYSTECTOMY OPEN  02/20/1989   COLONOSCOPY  06/23/2003   Dr. Laural Golden: pancolonic divericula, polyp, path unknown currently   COLONOSCOPY  06/22/2010   Dr. Oneida Alar: Normal TI, scattered diverticula in entire colon, small internal hemorrhoids, normal colon biopsies. Colonoscopy in 5-10 years.    COLONOSCOPY WITH PROPOFOL N/A 06/02/2021   pancolonic diverticulosis. Two 4-6 mm polyps in transverse colon. Sessile serrated and hyperplastic. 5 year surveillance if benefits outweigh the  risks.   COLOSTOMY  05/23/1979   COLOSTOMY REVERSAL  11/21/1979   EP IMPLANTABLE DEVICE N/A 06/25/2016   Procedure: Lead Revision/Repair;  Surgeon: Kathrin Folden Meredith Leeds, MD;  Location: [place] CV LAB;  Service: Cardiovascular;  Laterality: N/A;   EP IMPLANTABLE DEVICE N/A 06/25/2016   Procedure: Pacemaker Implant;  Surgeon: Danyle Boening Meredith Leeds, MD;  Location: [place] CV LAB;  Service: Cardiovascular;  Laterality: N/A;   EXCISIONAL HEMORRHOIDECTOMY  02/20/1969   EYE SURGERY Left 06/22/1998   "branch vein occlusion"   EYE SURGERY Left ~ 2001   "smoothed out wrinkle"   LEFT OOPHORECTOMY  05/23/1979   nicked bowel, peritonitis, colostomy; colostomy reversed 1981    LOWER EXTREMITY ANGIOGRAM N/A 01/03/2014   Procedure: LOWER EXTREMITY ANGIOGRAM;  Surgeon: Wellington Hampshire, MD;  Location: [facility] CATH LAB;  Service: Cardiovascular;  Laterality: N/A;   Nuclear med stress test  10/21/2011   Small area of mild ischemia inferoapically.   PARTIAL HYSTERECTOMY  02/20/1969   left ovaries, then ovaries removed later due tumors    POLYPECTOMY  06/02/2021   Procedure: POLYPECTOMY;  Surgeon: Eloise Harman, DO;  [facility];  Service: Endoscopy;;   right eye surgery  03/2021  RIGHT OOPHORECTOMY  02/20/1969     Current Outpatient Medications  Medication Sig Dispense Refill   acetaminophen (TYLENOL) 325 MG tablet Take 650 mg by mouth every 6 (six) hours as needed for headache.     amiodarone (PACERONE) 200 MG tablet Take 0.5 tablets (100 mg total) by mouth daily. 45 tablet 1   apixaban (ELIQUIS) 5 MG TABS tablet Take 1 tablet (5 mg total) by mouth 2 (two) times daily. 30 tablet 5   atorvastatin (LIPITOR) 80 MG tablet TAKE ONE TABLET BY MOUTH EVERY DAY AT 6:00PM 90 tablet 3   calcitRIOL (ROCALTROL) 0.25 MCG capsule Take 0.25 mcg by mouth 3 (three) times a week.     carvedilol (COREG) 6.25 MG tablet Take 1 tablet (6.25 mg total) by mouth 2 (two) times daily. 60 tablet 3    Cholecalciferol (VITAMIN D3 PO) Take 2,000 Units by mouth every evening.     clopidogrel (PLAVIX) 75 MG tablet TAKE ONE TABLET (75MG TOTAL) BY MOUTH DAILY 90 tablet 3   Continuous Blood Gluc Receiver (FREESTYLE LIBRE 2 READER) DEVI 1 each by Does not apply route daily. 1 each 0   Continuous Blood Gluc Sensor (FREESTYLE LIBRE 2 SENSOR) MISC USE ONE FOR FOURTEEN DAYS 6 each 3   glucose blood (FREESTYLE PRECISION NEO TEST) test strip Use as instructed 100 each 3   insulin NPH-regular Human (NOVOLIN 70/30) (70-30) 100 UNIT/ML injection INJECT 25 UNITS BEFORE BREAKFAST AND 15 UNITS BEFORE SUPPER 45 mL 3   levothyroxine (SYNTHROID) 112 MCG tablet TAKE ONE TABLET BY MOUTH ONCE DAILY 90 tablet 0   nitroGLYCERIN (NITROSTAT) 0.4 MG SL tablet Place 1 tablet (0.4 mg total) under the tongue every 5 (five) minutes as needed for chest pain. 25 tablet 0   olmesartan (BENICAR) 5 MG tablet Take 5 mg by mouth daily.     rOPINIRole (REQUIP) 0.5 MG tablet Take 0.5 mg by mouth at bedtime.   0   Semaglutide, 2 MG/DOSE, 8 MG/3ML SOPN Inject 2 mg as directed once a week. 9 mL 3   No current facility-administered medications for this visit.    Allergies:   Penicillins and Percocet [oxycodone-acetaminophen]   Social History:  The patient  reports that she has never smoked. She has never been exposed to tobacco smoke. She has never used smokeless tobacco. She reports that she does not drink alcohol and does not use drugs.   Family History:  The patient's family history includes Diabetes in her brother; Heart disease in her brother, brother, father, mother, and sister; Lupus in her daughter; Thyroid disease in her brother and brother.   ROS:  Please see the history of present illness.   Otherwise, review of systems is positive for none.   All other systems are reviewed and negative.   PHYSICAL EXAM: VS:  BP 128/74   Pulse 80   Ht 5' 3"  (1.6 m)   Wt 133 lb 3.2 oz (60.4 kg)   SpO2 96%   BMI 23.60 kg/m  , BMI Body  mass index is 23.6 kg/m. GEN: Well nourished, well developed, in no acute distress  HEENT: normal  Neck: no JVD, carotid bruits, or masses Cardiac: RRR; no murmurs, rubs, or gallops,no edema  Respiratory:  clear to auscultation bilaterally, normal work of breathing GI: soft, nontender, nondistended, + BS MS: no deformity or atrophy  Skin: warm and dry, device site well healed Neuro:  Strength and sensation are intact Psych: euthymic mood, full affect  EKG:  EKG is  ordered today. Personal review of the ekg ordered shows atrial paced, PACs, rate 80, IVCD  Personal review of the device interrogation today. Results in [facility]: 05/29/2021: BUN 25; Creatinine, Ser 1.58; Potassium 4.0; Sodium 140 08/25/2021: ALT 21 10/09/2021: TSH 1.18 11/05/2021: Hemoglobin 13.9; Platelets 295    Lipid Panel     Component Value Date/Time   CHOL 166 08/05/2017 0807   TRIG 160 (H) 08/05/2017 0807   HDL 43 (L) 08/05/2017 0807   CHOLHDL 3.9 08/05/2017 0807   VLDL NOT CALC 04/14/2016 0910   LDLCALC 96 08/05/2017 0807     Wt Readings from Last 3 Encounters:  03/17/22 133 lb 3.2 oz (60.4 kg)  02/20/22 133 lb 6.4 oz (60.5 kg)  02/09/22 137 lb (62.1 kg)      Other studies Reviewed: Additional studies/ records that were reviewed today include: TTE 08/04/17 Review of the above records today demonstrates:  - Left ventricle: The cavity size was normal. Wall thickness was   increased in a pattern of moderate LVH. Systolic function was   normal. The estimated ejection fraction was in the range of 55%   to 60%. Wall motion was normal; there were no regional wall   motion abnormalities. Features are consistent with a pseudonormal   left ventricular filling pattern, with concomitant abnormal   relaxation and increased filling pressure (grade 2 diastolic   dysfunction). Doppler parameters are consistent with high   ventricular filling pressure. - Left atrium: The atrium was severely dilated. -  Right ventricle: Pacer wire or catheter noted in right ventricle. - Right atrium: Pacer wire or catheter noted in right atrium.  Cath  08/02/15 1st RPLB lesion, 50% stenosed. Dist RCA lesion, 30% stenosed. Ost 1st Mrg to 1st Mrg lesion, 100% stenosed. Ost LAD lesion, 40% stenosed. Mid LAD lesion, 40% stenosed. Post Atrio lesion, 90% stenosed. Post intervention, there is a 0% residual stenosis. The left ventricular systolic function is normal. Mid RCA lesion, 20% stenosed.   Normal LV function without focal segmental wall motion abnormalities and ejection fraction of 55%.   Successful percutaneous cardiac intervention to the distal RCA treated with Angiosculpt scoring balloon, PTCA, and ultimate stenting with a 2.58 mm Xience Alpine DES stent postdilated to 2.51 mm with a 90% stenosis reduced to 0% and no change in the ostial PDA narrowing.  ASSESSMENT AND PLAN:  1.  Paroxysmal atrial fibrillation: Currently on amiodarone 100 mg daily, Eliquis 5 mg twice daily.  CHA2DS2-VASc of 7.  High risk medication monitoring for atrial fibrillation with labs checked today.  Less than 1% burden.  We Dametra Whetsel continue with current management.  2.  Sick sinus syndrome: Status post Medtronic dual-chamber pacemaker.  Device function appropriately.  No changes.  3.  Coronary artery disease: No current chest pain.  Continue with current management.  4.  Hypertension: Currently well controlled  5.  Secondary hypercoagulable state: Currently on Eliquis for atrial fibrillation as above  6.  Dizziness: Unclear as to the cause of her dizziness.  She states that multiple medicines have been stopped or held.  She is continue to have episodes a few times a week that make her feel like she is going to fall or potentially pass out.  They are sometimes related to standing up too fast.  Due to that, we Cathryne Mancebo stop her carvedilol for the next 2 weeks.  If this makes a difference, we Kelden Lavallee continue to hold the medication.  If  it makes no difference she  Giuliana Handyside restart it.  Current medicines are reviewed at length with the patient today.   The patient does not have concerns regarding her medicines.  Hold carvedilol  Labs/ tests ordered today include:  Orders Placed This Encounter  Procedures   Comp Met (CMET)   TSH   CBC   EKG 12-Lead      Disposition:   FU 6 months  Signed, Jasean Ambrosia Meredith Leeds, MD  03/17/2022 11:11 AM     [facility] [address] [place] [address] [phone] (office) [phone] (fax)

## 2022-03-18 LAB — TSH: TSH: 0.571 u[IU]/mL (ref 0.450–4.500)

## 2022-03-18 LAB — CBC
Hematocrit: 43.3 % (ref 34.0–46.6)
Hemoglobin: 13.7 g/dL (ref 11.1–15.9)
MCH: 29.8 pg (ref 26.6–33.0)
MCHC: 31.6 g/dL (ref 31.5–35.7)
MCV: 94 fL (ref 79–97)
Platelets: 327 10*3/uL (ref 150–450)
RBC: 4.59 x10E6/uL (ref 3.77–5.28)
RDW: 13.9 % (ref 11.7–15.4)
WBC: 9.1 10*3/uL (ref 3.4–10.8)

## 2022-03-18 LAB — COMPREHENSIVE METABOLIC PANEL
ALT: 27 IU/L (ref 0–32)
AST: 17 IU/L (ref 0–40)
Albumin/Globulin Ratio: 2.4 — ABNORMAL HIGH (ref 1.2–2.2)
Albumin: 3.9 g/dL (ref 3.8–4.8)
Alkaline Phosphatase: 85 IU/L (ref 44–121)
BUN/Creatinine Ratio: 15 (ref 12–28)
BUN: 19 mg/dL (ref 8–27)
Bilirubin Total: 0.5 mg/dL (ref 0.0–1.2)
CO2: 27 mmol/L (ref 20–29)
Calcium: 9.2 mg/dL (ref 8.7–10.3)
Chloride: 102 mmol/L (ref 96–106)
Creatinine, Ser: 1.25 mg/dL — ABNORMAL HIGH (ref 0.57–1.00)
Globulin, Total: 1.6 g/dL (ref 1.5–4.5)
Glucose: 153 mg/dL — ABNORMAL HIGH (ref 70–99)
Potassium: 3.7 mmol/L (ref 3.5–5.2)
Sodium: 140 mmol/L (ref 134–144)
Total Protein: 5.5 g/dL — ABNORMAL LOW (ref 6.0–8.5)
eGFR: 45 mL/min/{1.73_m2} — ABNORMAL LOW (ref >59)

## 2022-03-20 DIAGNOSIS — Z23 Encounter for immunization: Secondary | ICD-10-CM | POA: Diagnosis not present

## 2022-03-26 ENCOUNTER — Ambulatory Visit (INDEPENDENT_AMBULATORY_CARE_PROVIDER_SITE_OTHER): Payer: HMO | Admitting: Podiatry

## 2022-03-26 DIAGNOSIS — L600 Ingrowing nail: Secondary | ICD-10-CM

## 2022-03-26 DIAGNOSIS — M79674 Pain in right toe(s): Secondary | ICD-10-CM

## 2022-03-26 DIAGNOSIS — E119 Type 2 diabetes mellitus without complications: Secondary | ICD-10-CM

## 2022-03-26 DIAGNOSIS — M79675 Pain in left toe(s): Secondary | ICD-10-CM | POA: Diagnosis not present

## 2022-03-26 DIAGNOSIS — B351 Tinea unguium: Secondary | ICD-10-CM

## 2022-03-26 DIAGNOSIS — Z7901 Long term (current) use of anticoagulants: Secondary | ICD-10-CM

## 2022-03-26 NOTE — Progress Notes (Signed)
  Subjective:  Patient ID: Virginia Huber, female    DOB: 25-Apr-1946,  MRN: [id number]  Chief Complaint  Patient presents with   Nail Problem    Rm 15 RFC Bilateral nail trim 1-5    Ingrown Toenail    Right medial hallux ingrown pain x 1 month. Soreness and redness. Rubbing against 2nd toe.     76 y.o. female presents with the above complaint. History confirmed with patient.  Patient presents for routine foot care due to painful thickened dystrophic discolored elongated nails x5 present on both feet.  She is unable to trim herself.  She does have pain in the right hallux medial and lateral borders x1 month with soreness and redness present.  She is previously been told she may need to have an ingrown procedure done to rectify the pain on her right great toe.  Objective:  Physical Exam: warm, good capillary refill, nail exam onychomycosis of the toenails, ingrown nail at medial lateral border of the right hallux nail which is developed a pincer nail deformity, onycholysis, and dystrophic nails, no trophic changes or ulcerative lesions. DP pulses palpable, PT pulses palpable, and protective sensation intact Left Foot: normal exam, no swelling, tenderness, instability; ligaments intact, full range of motion of all ankle/foot joints  Right Foot: normal exam, no swelling, tenderness, instability; ligaments intact, full range of motion of all ankle/foot joints   No images are attached to the encounter.  Assessment:   1. Pain due to onychomycosis of toenails of both feet   2. Diabetes mellitus without complication ([place])   3. Anticoagulated   4. Ingrown nail      Plan:  Patient was evaluated and treated and all questions answered.  Ingrown Nail, right hallux bilateral border -Patient elects to defer ingrown nail removal which was recommended for the right hallux medial lateral border at this time. -She is going on a trip starting tomorrow and she does not think she will be able to do the  recommended aftercare. -We will plan on proceeding with this in 10 weeks when she returns for follow-up.  Onychomycosis with pain  -Nails palliatively debrided as below. -Educated on self-care  Procedure: Nail Debridement Rationale: Pain Type of Debridement: manual, sharp debridement. Instrumentation: Nail nipper, rotary burr. Number of Nails: 10  Return in about 10 weeks (around 06/04/2022) for RFC, R hallux ingrown removal.         Everitt Amber, DPM [facility]

## 2022-03-26 NOTE — Patient Instructions (Signed)

## 2022-03-29 ENCOUNTER — Encounter: Payer: Self-pay | Admitting: Cardiology

## 2022-03-29 DIAGNOSIS — I10 Essential (primary) hypertension: Secondary | ICD-10-CM

## 2022-03-30 ENCOUNTER — Other Ambulatory Visit: Payer: Self-pay | Admitting: Internal Medicine

## 2022-04-05 ENCOUNTER — Other Ambulatory Visit: Payer: Self-pay | Admitting: Physician Assistant

## 2022-04-05 ENCOUNTER — Other Ambulatory Visit: Payer: Self-pay | Admitting: Internal Medicine

## 2022-04-05 ENCOUNTER — Other Ambulatory Visit: Payer: Self-pay | Admitting: Cardiology

## 2022-04-05 DIAGNOSIS — E1159 Type 2 diabetes mellitus with other circulatory complications: Secondary | ICD-10-CM

## 2022-04-06 ENCOUNTER — Other Ambulatory Visit: Payer: Self-pay | Admitting: Internal Medicine

## 2022-04-06 NOTE — Telephone Encounter (Signed)
Late Entry: I did speak with pt last week.

## 2022-04-06 NOTE — Telephone Encounter (Signed)
Prescription refill request for Eliquis received. Indication: PAF Last office visit: 03/17/22  Elliot Cousin MD Scr: 1.25 on 03/17/22 Age: 76 Weight: 60.4kg  Based on above findings Eliquis '5mg'$  twice daily is the appropriate dose.  Refill approved.

## 2022-04-08 ENCOUNTER — Ambulatory Visit (INDEPENDENT_AMBULATORY_CARE_PROVIDER_SITE_OTHER): Payer: HMO

## 2022-04-08 DIAGNOSIS — I495 Sick sinus syndrome: Secondary | ICD-10-CM

## 2022-04-08 LAB — CUP PACEART REMOTE DEVICE CHECK
Battery Remaining Longevity: 61 mo
Battery Voltage: 2.99 V
Brady Statistic AP VP Percent: 0.02 %
Brady Statistic AP VS Percent: 31.11 %
Brady Statistic AS VP Percent: 0.04 %
Brady Statistic AS VS Percent: 68.83 %
Brady Statistic RA Percent Paced: 31.08 %
Brady Statistic RV Percent Paced: 0.06 %
Date Time Interrogation Session: 20231018084035
Implantable Lead Implant Date: 20180104
Implantable Lead Implant Date: 20180104
Implantable Lead Location: 753859
Implantable Lead Location: 753860
Implantable Lead Model: 5076
Implantable Lead Model: 5076
Implantable Pulse Generator Implant Date: 20180104
Lead Channel Impedance Value: 342 Ohm
Lead Channel Impedance Value: 399 Ohm
Lead Channel Impedance Value: 418 Ohm
Lead Channel Impedance Value: 475 Ohm
Lead Channel Pacing Threshold Amplitude: 0.75 V
Lead Channel Pacing Threshold Amplitude: 1 V
Lead Channel Pacing Threshold Pulse Width: 0.4 ms
Lead Channel Pacing Threshold Pulse Width: 0.4 ms
Lead Channel Sensing Intrinsic Amplitude: 1.5 mV
Lead Channel Sensing Intrinsic Amplitude: 1.5 mV
Lead Channel Sensing Intrinsic Amplitude: 14.875 mV
Lead Channel Sensing Intrinsic Amplitude: 14.875 mV
Lead Channel Setting Pacing Amplitude: 2 V
Lead Channel Setting Pacing Amplitude: 2.5 V
Lead Channel Setting Pacing Pulse Width: 0.4 ms
Lead Channel Setting Sensing Sensitivity: 2.8 mV

## 2022-04-09 NOTE — Telephone Encounter (Signed)
Left message to call back  

## 2022-04-10 ENCOUNTER — Other Ambulatory Visit: Payer: Self-pay | Admitting: Physician Assistant

## 2022-04-13 ENCOUNTER — Encounter (INDEPENDENT_AMBULATORY_CARE_PROVIDER_SITE_OTHER): Payer: HMO | Admitting: Ophthalmology

## 2022-04-14 ENCOUNTER — Telehealth: Payer: Self-pay | Admitting: Cardiology

## 2022-04-14 MED ORDER — CARVEDILOL 6.25 MG PO TABS: 6.2500 mg | ORAL_TABLET | Freq: Two times a day (BID) | ORAL | 3 refills | Status: DC

## 2022-04-14 NOTE — Telephone Encounter (Signed)
Completed.

## 2022-04-14 NOTE — Telephone Encounter (Addendum)
Pt just returned from vacation in [place]. States BPs are still elevated, last night around 7pm 173/93. Pt has not confirmed BP cuff is reading correctly, advised to follow up on this. Also discussed whether numbers were just elevated in the evenings vs mornings vs all day. Aware we are referring her to the HTN clinic w/ pharmD for further eval/discussion of this.  Understands office will call to arrange.  Pt is also going to check on the cost of Multaq again to see if more affordable now. (Tremors on Amiodarone are worsening, but she is willing to stay on it if Multaq unaffordable, and it is needed until ablation can be performed next year) She will let us know her findings.

## 2022-04-14 NOTE — Telephone Encounter (Signed)
*  STAT* If patient is at the pharmacy, call can be transferred to refill team.   1. Which medications need to be refilled? (please list name of each medication and dose if known)  carvedilol (COREG) 6.25 MG tablet  2. Which pharmacy/location (including street and city if local pharmacy) is medication to be sent to? [facility] - [place], [place] - [place] [address]  3. Do they need a 30 day or 90 day supply? 90 day

## 2022-04-15 ENCOUNTER — Encounter (INDEPENDENT_AMBULATORY_CARE_PROVIDER_SITE_OTHER): Payer: HMO | Admitting: Ophthalmology

## 2022-04-16 NOTE — Progress Notes (Signed)
Patient ID: MARCIEL OFFENBERGER                 DOB: 01/13/1946                      MRN: [id number]      PFX:[id number] B Virginia Huber is a 76 y.o. female referred by Dr. Curt Bears to HTN clinic. PMH is significant for NSTEMI, CAD coronary bypass graft with unstable angina, systolic HF, OXB,D5HG, OSA and CKD stage 3.   Today no acute concern reported by patient. Patient checks BP regularly at least twice daily and more if does not feel good. Her BP use to be really low and felt dizzy but went off of many BP medications. Her nephrologist started Olmesartan almost 2 months ago and Dr.Camnitz had put her on carvedilol 6.25 twice daily just one month ago. Reports taking the medications regularly without forgetting and tolerates it well without side effects. Denies SOB, chest pain or palpitation with ordinary activity but gets short of breath and chest pain on exertion. Patient brought home cuff for validation which is not accurate (readings are listed below with manual readings). Patient does not do any exercise but motivated to start chair exercises. Being diabetic she has been cautious about her diet, eats healthy- does not add salt when she cooks her food or add on top. Patient is concern about the high BP readings and is in agreement to make medication changes.   Home Cuff validation  SBP/DBP  HR  1st on home cuff  163/82 78  2nd home cuff  150/75 64  1st Manual in office   138/78 57  2nd Manual in office  134/78    Current HTN meds: Olmesartan 5 mg daily Coreg 6.25 mg twice daily  Previously tried: amlodipine 10 mg - dizziness  BP goal: <130/80  Family History:  Mother- died do not know the reason  Brother and Father -died of MI  other brother and sister - bad heart and kidney   Social History:  Alcohol: none  Tobacco: none   Diet:  Breakfast- yogurt with fruit  Lunch- sandwich Dinner - Turnip green/ green salad, potatoes/ rice, meat (chicken) - grilled  Eat out couple times per week Decaffeinated  tea/coffee once and rest of the day just water   Exercise: The patient does not participate in regular exercise at present. But saw chair exercise video and may start doing it, does not like to walk due to leg pain. Encouraged her to start at least 5 min walk and work your way up.  Home BP readings:  ~150-160/80-85 heart rate ~60   Wt Readings from Last 3 Encounters:  03/17/22 133 lb 3.2 oz (60.4 kg)  02/20/22 133 lb 6.4 oz (60.5 kg)  02/09/22 137 lb (62.1 kg)   BP Readings from Last 3 Encounters:  04/17/22 134/82  03/17/22 128/74  02/20/22 (!) 144/90   Pulse Readings from Last 3 Encounters:  04/17/22 (!) 57  03/17/22 80  02/20/22 83    Renal function: CrCl cannot be calculated (Patient's most recent lab result is older than the maximum 21 days allowed.).  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  h/o PAF/flutter during admission in 2013 for PNA. b. PAF during adm for NSTEMI 07/2015, subsequent paroxysms since then.   B12 deficiency anemia    Branch retinal vein occlusion with macular edema of right eye 10/11/2019   Chronic renal failure, stage 3b ([place]) 09/23/2017   Coronary artery disease 11/30/2014   a. remote MI. b. h/o PTCA with scoring balloon to OM1 11/2014. c. NSTEMI 03/2015 s/p DES to prox-mid Cx. d. NSTEMI 07/2015 s/p scoring balloon/PTCA/DES to dRCA with PAF during that admission   Cutaneous lupus erythematosus    Early stage nonexudative age-related macular degeneration of right eye 02/27/2020   Essential hypertension    GERD (gastroesophageal reflux disease)    History of blood transfusion 1980's   2nd surgical procedures   Hypercholesteremia     Hypothyroidism    Myocardial infarction ([place]) 02/2012   NSTEMI (non-ST elevated myocardial infarction) ([place]) 04/02/2015   OSA (obstructive sleep apnea) 05/13/2016   Ovarian tumor    PAD (peripheral artery disease) ([place])    a. s/p LE angio 2015; followed by Dr. Fletcher Anon - managed medically.   Pericardial effusion    a. 06/2016 after ppm - s/p pericardiocentesis.   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 s/p Medtronic PPM 06/2016, c/b lead perf/pericardial effusion.   TIA (transient ischemic attack)    Type 2 diabetes with nephropathy ([place]) 02/29/2012    Current Outpatient Medications on File Prior to Visit  Medication Sig Dispense Refill   acetaminophen (TYLENOL) 325 MG tablet Take 650 mg by mouth every 6 (six) hours as needed for headache.     amiodarone (PACERONE) 200 MG tablet Take 0.5 tablets (100 mg total) by mouth daily. 45 tablet 1   apixaban (ELIQUIS) 5 MG TABS tablet TAKE ONE TABLET BY MOUTH TWICE DAILY 60 tablet 5   atorvastatin (LIPITOR) 80 MG tablet TAKE ONE TABLET BY MOUTH EVERY DAY AT 6:00PM 90 tablet 3   calcitRIOL (ROCALTROL) 0.25 MCG capsule Take 0.25 mcg by mouth 3 (three) times a week.     carvedilol (COREG) 6.25 MG tablet Take 1 tablet (6.25 mg total) by mouth 2 (two) times daily. 180 tablet 3   Cholecalciferol (VITAMIN D3 PO) Take 2,000 Units by mouth every evening.     clopidogrel (PLAVIX) 75 MG tablet TAKE ONE TABLET ('75MG'$  TOTAL) BY MOUTH DAILY 90 tablet 3   Continuous Blood Gluc Receiver (FREESTYLE LIBRE 2 READER) DEVI 1 each by Does not apply route daily. 1 each 0   Continuous Blood Gluc Sensor (FREESTYLE LIBRE 2 SENSOR) MISC USE ONE FOR FOURTEEN  DAYS 6 each 3   glucose blood (FREESTYLE PRECISION NEO TEST) test strip Use as instructed 100 each 3   Insulin NPH, Human,, Isophane, (NOVOLIN N FLEXPEN) 100 UNIT/ML Kiwkpen INJECT 25 UNITS in THE MORNING AND 15 UNITS BEFORE dinner 30 mL 1   insulin NPH-regular Human (NOVOLIN 70/30) (70-30) 100 UNIT/ML injection INJECT 25 UNITS BEFORE BREAKFAST AND 15 UNITS BEFORE SUPPER 45 mL 3   levothyroxine (SYNTHROID) 112 MCG tablet TAKE ONE TABLET BY MOUTH ONCE DAILY 90 tablet 0   nitroGLYCERIN (NITROSTAT) 0.4 MG SL tablet Place 1 tablet (0.4 mg total) under the tongue every 5 (  five) minutes as needed for chest pain. 25 tablet 0   olmesartan (BENICAR) 5 MG tablet Take 5 mg by mouth daily.     rOPINIRole (REQUIP) 0.5 MG tablet Take 0.5 mg by mouth at bedtime.   0   Semaglutide, 2 MG/DOSE, 8 MG/3ML SOPN Inject 2 mg as directed once a week. 9 mL 3   No current facility-administered medications on file prior to visit.    Allergies  Allergen Reactions   Penicillins Hives    Has patient had a PCN reaction causing immediate rash, facial/tongue/throat swelling, SOB or lightheadedness with hypotension: Yes Has patient had a PCN reaction causing severe rash involving mucus membranes or skin necrosis: No Has patient had a PCN reaction that required hospitalization No Has patient had a PCN reaction occurring within the last 10 years: No If all of the above answers are "NO", then may proceed with Cephalosporin use.   Percocet [Oxycodone-Acetaminophen] Nausea And Vomiting    Blood pressure 134/82, pulse (!) 57, SpO2 98 %.   Essential hypertension Assessment:  BP: uncontrolled in office BP was 134/76 (Goal <130/80) Patient takes Olmesartan 5 mg daily Coreg 6.25 mg twice daily regularly and tolerates it well without side effects  Denies SOB, chest pain or palpitation with ordinary activity but gets short of breath and chest pain on exertion. Patient does not do any exercise but motivated to start chair  exercises and walking , follow healthy low salt diet  Patient is in agreement to optimize medication, either increase dose of one of the current medications or  add CCBs low dose   Plan:  Continue taking carvedilol 6.25 mg twice daily  Increase the olmesartan dose from 5 mg to 10 mg daily  Start going for regular walk and chair exercise at least 5 min in the morning and if comfortable 5 min in the evening Lab from nephrology is due in week- would be able to assess  renal effect of the change   Patient to buy new home cuff, bring it at the next visit for validation and only check BP at home every other day Patient to see PharmD in 5 weeks   Thank you  Cammy Copa, Pharm.D [facility] of [facility] [address], [place], [place] [address]  Phone: [phone]; Fax: [phone]

## 2022-04-17 ENCOUNTER — Ambulatory Visit: Payer: HMO | Attending: Cardiovascular Disease | Admitting: Student

## 2022-04-17 DIAGNOSIS — I10 Essential (primary) hypertension: Secondary | ICD-10-CM | POA: Diagnosis not present

## 2022-04-17 NOTE — Assessment & Plan Note (Addendum)
Assessment:   BP: uncontrolled in office BP was 134/76 (Goal <130/80)  Patient takes Olmesartan 5 mg daily Coreg 6.25 mg twice daily regularly and tolerates it well without side effects   Denies SOB, chest pain or palpitation with ordinary activity but gets short of breath and chest pain on exertion.  Patient does not do any exercise but motivated to start chair exercises and walking , follow healthy low salt diet   Patient is in agreement to optimize medication, either increase dose of one of the current medications or add CCBs low dose   Plan:   Continue taking carvedilol 6.25 mg twice daily   Increase the olmesartan dose from 5 mg to 10 mg daily   Start going for regular walk and chair exercise at least 5 min in the morning and if comfortable 5 min in the evening  Lab from nephrology is due in week- would be able to assess  renal effect of the change    Patient to buy new home cuff, bring it at the next visit for validation and only check BP at home every other day  Patient to see PharmD in 5 weeks

## 2022-04-17 NOTE — Patient Instructions (Addendum)
Changes made by your pharmacist Cammy Copa, PharmD at today's visit:    Instructions/Changes  (what do you need to do) Your Notes  (what you did and when you did it)  1.Continue taking Coreg 6.25 mg twice daily    2.Increase olmesartan 5 mg to 10 mg daily    3. Start going for regular walk at least 5 min in the morning and if comfortable 5 min in the evening    4. Monitor BP only every other day    Bring all of your meds, your BP cuff and your record of home blood pressures to your next appointment.    HOW TO TAKE YOUR BLOOD PRESSURE AT HOME  Rest 5 minutes before taking your blood pressure.  Don't smoke or drink caffeinated beverages for at least 30 minutes before. Take your blood pressure before (not after) you eat. Sit comfortably with your back supported and both feet on the floor (don't cross your legs). Elevate your arm to heart level on a table or a desk. Use the proper sized cuff. It should fit smoothly and snugly around your bare upper arm. There should be enough room to slip a fingertip under the cuff. The bottom edge of the cuff should be 1 inch above the crease of the elbow. Ideally, take 3 measurements at one sitting and record the average.  Important lifestyle changes to control high blood pressure  Intervention  Effect on the BP  Lose extra pounds and watch your waistline Weight loss is one of the most effective lifestyle changes for controlling blood pressure. If you're overweight or obese, losing even a small amount of weight can help reduce blood pressure. Blood pressure might go down by about 1 millimeter of mercury (mm Hg) with each kilogram (about 2.2 pounds) of weight lost.  Exercise regularly As a general goal, aim for at least 30 minutes of moderate physical activity every day. Regular physical activity can lower high blood pressure by about 5 to 8 mm Hg.  Eat a healthy diet Eating a diet rich in whole grains, fruits, vegetables, and low-fat dairy products  and low in saturated fat and cholesterol. A healthy diet can lower high blood pressure by up to 11 mm Hg.  Reduce salt (sodium) in your diet Even a small reduction of sodium in the diet can improve heart health and reduce high blood pressure by about 5 to 6 mm Hg.  Limit alcohol One drink equals 12 ounces of beer, 5 ounces of wine, or 1.5 ounces of 80-proof liquor.  Limiting alcohol to less than one drink a day for women or two drinks a day for men can help lower blood pressure by about 4 mm Hg.   If you have any questions or concerns please use My Chart to send questions or call the office at [phone], Fax [phone]

## 2022-04-21 NOTE — Progress Notes (Signed)
Remote pacemaker transmission.   

## 2022-05-06 NOTE — Patient Instructions (Incomplete)
Please continue using your CPAP regularly. While your insurance requires that you use CPAP at least 4 hours each night on 70% of the nights, I recommend, that you not skip any nights and use it throughout the night if you can. Getting used to CPAP and staying with the treatment long term does take time and patience and discipline. Untreated obstructive sleep apnea when it is moderate to severe can have an adverse impact on cardiovascular health and raise her risk for heart disease, arrhythmias, hypertension, congestive heart failure, stroke and diabetes. Untreated obstructive sleep apnea causes sleep disruption, nonrestorative sleep, and sleep deprivation. This can have an impact on your day to day functioning and cause daytime sleepiness and impairment of cognitive function, memory loss, mood disturbance, and problems focussing. Using CPAP regularly can improve these symptoms. ° ° °Follow up pending sleep study  °

## 2022-05-06 NOTE — Progress Notes (Unsigned)
PATIENT: Virginia Huber DOB: 09-06-1945  REASON FOR VISIT: follow up HISTORY FROM: patient  No chief complaint on file.     HISTORY OF PRESENT ILLNESS:  05/06/22 ALL:  Celena returns for follow up for OSA on CPAP.   New machine?   05/06/2021 ALL: Virginia Huber is a 76 y.o. female here today for follow up for OSA on CPAP. She is doing well on therapy. She does feel more refreshed when using CPAP. She continues to follow closely with cardiology and ophthalmology. She is s/p eye surgery and was unable to use CPAP for about 2 weeks in October. Tremor is stable. Seems to be worse with activity. Both hands effected. Neuropathy is stable. She is followed closely by PCP.      HISTORY: (copied from Dr Dohmeier's previous note)  HPI:  Virginia Huber is a 76 y.o. female patient , seen in a RV after sleep study and for CPAP compliance. CC: Loud snoring, dependent odema, atrial fib, CHF, witnessed apnea, fatigue, TIA.  She reports her family is doing well, nobody has fallen sick during this pandemic. Her grandson got infected as an EMT, recovered well. Her short term memory has been slowly declining and she has trouble to fall asleep and stay asleep. Her sleep study was performed elsewhere on 04-12-2016. She has been doing exquisitely well with her CPAP use 97% compliance by time and by days with an average of 8 hours and 12 minutes her CPAP is set for 10 cm pressure with 3 cm EPR and is now 76 years old.  Her residual AHI is low at 1.4 apneas per hour and she has very minimal air leakage.  This means that her mask fits well and that her apnea is well controlled I would not have to do any adjustments to that.  Any new mask or supplies go through her DME [employer].  The patient also endorsed only 2 points on the Epworth Sleepiness Scale at 18 points on the fatigue severity scale, and 1 point out of 15 on the geriatric depression score.   She is fully vaccinated against Covid.  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  She was taken recently of Metformin for at the beginning of the year and since then had actually more trouble controlling her blood sugar and her blood pressure today.  The step to discontinue Metformin was meant to put protect her kidney function. She is finally seen by Renato Shin, MD - endocrinologist.     The patient continues to be a highly compliant CPAP user she has used her machine 28 over the last 30 days ending on 05 February 2019.  93% compliance for time and days with an average user time of 7 hours 27 minutes, she usually sleeps 8 hours or uses the machine 8 hours at night.  The pressure is set at 10 cmH2O her machine is provided by [employer].  She has an expiratory pressure relief setting of 3 cm and uses she has been using a full facemask with very little air leakage the 95th percentile is 1.2 L/min and a residual AHI is 1.0.  However she reports difficulties with falling asleep, staying asleep and with the mask fit by now.  She feels that she would be ready for change maybe trying a nasal mask or nasal cradle. Sometimes she drools at night.  Trazodone- 25 mg , a half tablet, she can increase to a full if needed. I will  order a nasal mask, such as the wisp.      REVIEW OF SYSTEMS: Out of a complete 14 system review of symptoms, the patient complains only of the following symptoms, neuropathy, tremor, and all other reviewed systems are negative.  ESS: 6  ALLERGIES: Allergies  Allergen Reactions   Penicillins Hives    Has patient had a PCN reaction causing immediate rash, facial/tongue/throat swelling, SOB or lightheadedness with hypotension: Yes Has patient had a PCN reaction causing severe rash involving mucus membranes or skin necrosis: No Has patient had a PCN reaction that required hospitalization No Has patient had a PCN reaction occurring within the last 10 years: No If all of the above answers are "NO", then  may proceed with Cephalosporin use.   Percocet [Oxycodone-Acetaminophen] Nausea And Vomiting    HOME MEDICATIONS: Outpatient Medications Prior to Visit  Medication Sig Dispense Refill   acetaminophen (TYLENOL) 325 MG tablet Take 650 mg by mouth every 6 (six) hours as needed for headache.     amiodarone (PACERONE) 200 MG tablet Take 0.5 tablets (100 mg total) by mouth daily. 45 tablet 1   apixaban (ELIQUIS) 5 MG TABS tablet TAKE ONE TABLET BY MOUTH TWICE DAILY 60 tablet 5   atorvastatin (LIPITOR) 80 MG tablet TAKE ONE TABLET BY MOUTH EVERY DAY AT 6:00PM 90 tablet 3   calcitRIOL (ROCALTROL) 0.25 MCG capsule Take 0.25 mcg by mouth 3 (three) times a week.     carvedilol (COREG) 6.25 MG tablet Take 1 tablet (6.25 mg total) by mouth 2 (two) times daily. 180 tablet 3   Cholecalciferol (VITAMIN D3 PO) Take 2,000 Units by mouth every evening.     clopidogrel (PLAVIX) 75 MG tablet TAKE ONE TABLET ('75MG'$  TOTAL) BY MOUTH DAILY 90 tablet 3   Continuous Blood Gluc Receiver (FREESTYLE LIBRE 2 READER) DEVI 1 each by Does not apply route daily. 1 each 0   Continuous Blood Gluc Sensor (FREESTYLE LIBRE 2 SENSOR) MISC USE ONE FOR FOURTEEN DAYS 6 each 3   glucose blood (FREESTYLE PRECISION NEO TEST) test strip Use as instructed 100 each 3   Insulin NPH, Human,, Isophane, (NOVOLIN N FLEXPEN) 100 UNIT/ML Kiwkpen INJECT 25 UNITS in THE MORNING AND 15 UNITS BEFORE dinner 30 mL 1   insulin NPH-regular Human (NOVOLIN 70/30) (70-30) 100 UNIT/ML injection INJECT 25 UNITS BEFORE BREAKFAST AND 15 UNITS BEFORE SUPPER 45 mL 3   levothyroxine (SYNTHROID) 112 MCG tablet TAKE ONE TABLET BY MOUTH ONCE DAILY 90 tablet 0   nitroGLYCERIN (NITROSTAT) 0.4 MG SL tablet Place 1 tablet (0.4 mg total) under the tongue every 5 (five) minutes as needed for chest pain. 25 tablet 0   olmesartan (BENICAR) 5 MG tablet Take 5 mg by mouth daily.     rOPINIRole (REQUIP) 0.5 MG tablet Take 0.5 mg by mouth at bedtime.   0   Semaglutide, 2 MG/DOSE, 8  MG/3ML SOPN Inject 2 mg as directed once a week. 9 mL 3   No facility-administered medications prior to visit.    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 h/o PAF/flutter during admission in 2013 for PNA. b. PAF during adm for NSTEMI 07/2015, subsequent paroxysms since then.  B12 deficiency anemia    Branch retinal vein occlusion with macular edema of right eye 10/11/2019   Chronic renal failure, stage 3b ([place]) 09/23/2017   Coronary artery disease 11/30/2014   a. remote MI. b. h/o PTCA with scoring balloon to OM1 11/2014. c. NSTEMI 03/2015 s/p DES to prox-mid Cx. d. NSTEMI 07/2015 s/p scoring balloon/PTCA/DES to dRCA with PAF during that admission   Cutaneous lupus erythematosus    Early stage nonexudative age-related macular degeneration of right eye 02/27/2020   Essential hypertension    GERD (gastroesophageal reflux disease)    History of blood transfusion 1980's   2nd surgical procedures   Hypercholesteremia    Hypothyroidism    Myocardial infarction ([place]) 02/2012   NSTEMI (non-ST elevated myocardial infarction) ([place]) 04/02/2015   OSA (obstructive sleep apnea) 05/13/2016   Ovarian tumor    PAD (peripheral artery disease) ([place])    a. s/p LE angio 2015; followed by Dr. Fletcher Anon - managed medically.   Pericardial effusion    a. 06/2016 after ppm - s/p pericardiocentesis.   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 s/p Medtronic PPM 06/2016, c/b lead perf/pericardial effusion.   TIA (transient ischemic attack)    Type 2 diabetes with nephropathy ([place]) 02/29/2012    PAST SURGICAL HISTORY: Past Surgical History:  Procedure Laterality Date   ABDOMINAL AORTAGRAM N/A 01/03/2014   Procedure: ABDOMINAL [name];  Surgeon: Wellington Hampshire, MD;  Location: [facility]ATH [facility];  Service: Cardiovascular;  Laterality: N/A;   ABDOMINAL HYSTERECTOMY  06/22/1970   "partial"   APPENDECTOMY  02/20/1969   CARDIAC CATHETERIZATION  06/22/2006   Tiny OM-2 with 90% narrowing. Med tx.   CARDIAC CATHETERIZATION N/A 11/30/2014   Procedure: Left Heart Cath and Coronary Angiography;  Surgeon: Troy Sine, MD; LAD 20%, CFX 50%, OM1 95%, right PLB 30%, LV normal    CARDIAC CATHETERIZATION N/A 11/30/2014   Procedure: Coronary Balloon Angioplasty;  Surgeon: Troy Sine, MD;  Angiosculpt scoring balloon and PTCA to the OM1 reducing stenosis from 95% to less than 10%   CARDIAC CATHETERIZATION N/A 04/03/2015   Procedure: Left Heart Cath and Coronary Angiography;  Surgeon: Jolaine Artist, MD; dLAD 50%, CFX 90%, OM1 100%, PLA 15%, LVEDP 13     CARDIAC CATHETERIZATION N/A 04/03/2015   Procedure: Coronary Stent Intervention;  Surgeon: Sherren Mocha, MD; 3.0x18 mm Xience DES to the CFX     CARDIAC CATHETERIZATION N/A 08/02/2015   Procedure: Left Heart Cath and Coronary Angiography;  Surgeon: Troy Sine, MD;  Location: [facility];  Service: Cardiovascular;  Laterality: N/A;   CARDIAC CATHETERIZATION N/A 08/02/2015   Procedure: Coronary Stent Intervention;  Surgeon: Troy Sine, MD;  Location: [facility];  Service: Cardiovascular;  Laterality: N/A;   CARDIAC CATHETERIZATION N/A 06/25/2016   Procedure: Pericardiocentesis;  Surgeon: Will Meredith Leeds, MD;  Location: [facility];  Service: Cardiovascular;  Laterality: N/A;   cardiac  stents     CARDIOVERSION N/A 12/15/2017   Procedure: CARDIOVERSION;  Surgeon: Acie Fredrickson Wonda Cheng, MD;  Location: [facility] ENDOSCOPY;  Service: Cardiovascular;  Laterality: N/A;   CHOLECYSTECTOMY OPEN  02/20/1989   COLONOSCOPY  06/23/2003  Dr. Laural Golden: pancolonic divericula, polyp, path unknown currently   COLONOSCOPY  06/22/2010   Dr. Oneida Alar: Normal TI, scattered diverticula in entire colon, small internal hemorrhoids, normal colon biopsies. Colonoscopy in 5-10 years.    COLONOSCOPY WITH PROPOFOL N/A 06/02/2021   pancolonic diverticulosis. Two 4-6 mm polyps in transverse colon. Sessile serrated and hyperplastic. 5 year surveillance if benefits outweigh the risks.   COLOSTOMY  05/23/1979   COLOSTOMY REVERSAL  11/21/1979   EP IMPLANTABLE DEVICE N/A 06/25/2016   Procedure: Lead Revision/Repair;  Surgeon: Will Meredith Leeds, MD;  Location: [facility];  Service: Cardiovascular;  Laterality: N/A;   EP IMPLANTABLE DEVICE N/A 06/25/2016   Procedure: Pacemaker Implant;  Surgeon: Will Meredith Leeds, MD;  Location: [facility];  Service: Cardiovascular;  Laterality: N/A;   EXCISIONAL HEMORRHOIDECTOMY  02/20/1969   EYE SURGERY Left 06/22/1998   "branch vein occlusion"   EYE SURGERY Left ~ 2001   "smoothed out wrinkle"   LEFT OOPHORECTOMY  05/23/1979   nicked bowel, peritonitis, colostomy; colostomy reversed 1981    LOWER EXTREMITY ANGIOGRAM N/A 01/03/2014   Procedure: LOWER EXTREMITY ANGIOGRAM;  Surgeon: Wellington Hampshire, MD;  Location: [facility] CATH LAB;  Service: Cardiovascular;  Laterality: N/A;   Nuclear med stress test  10/21/2011   Small area of mild ischemia inferoapically.   PARTIAL HYSTERECTOMY  02/20/1969   left ovaries, then ovaries removed later due tumors    POLYPECTOMY  06/02/2021   Procedure: POLYPECTOMY;  Surgeon: Eloise Harman, DO;  [facility]: [facility];  Service: Endoscopy;;   right eye surgery  03/2021   RIGHT OOPHORECTOMY  02/20/1969    FAMILY HISTORY: Family  History  Problem Relation Age of Onset   Heart disease Mother        deceased   Heart disease Father        deceased, heart disease   Diabetes Brother    Heart disease Brother    Thyroid disease Brother    Heart disease Sister    Heart disease Brother    Thyroid disease Brother    Lupus Daughter    Colon cancer Neg Hx     SOCIAL HISTORY: Social History   Socioeconomic History   Marital status: Married    Spouse name: Not on file   Number of children: Not on file   Years of education: Not on file   Highest education level: Not on file  Occupational History   Occupation: Retired    Fish farm manager: RETIRED    Comment: insurance billing  Tobacco Use   Smoking status: Never    Passive exposure: Never   Smokeless tobacco: Never   Tobacco comments:    Never smoked  Vaping Use   Vaping Use: Never used  Substance and Sexual Activity   Alcohol use: No    Alcohol/week: 0.0 standard drinks of alcohol   Drug use: No   Sexual activity: Never    Birth control/protection: Surgical    Comment: hyst  Other Topics Concern   Not on file  Social History Narrative   Not on file   Social Determinants of Health   Financial Resource Strain: Not on file  Food Insecurity: Not on file  Transportation Needs: Not on file  Physical Activity: Not on file  Stress: Not on file  Social Connections: Not on file  Intimate Partner Violence: Not on file     PHYSICAL EXAM  There were no vitals filed for this visit.  There is no height  or weight on file to calculate BMI.  Generalized: Well developed, in no acute distress  Cardiology: normal rate and rhythm, no murmur noted Respiratory: clear to auscultation bilaterally  Neurological examination  Mentation: Alert oriented to time, place, history taking. Follows all commands speech and language fluent Cranial nerve II-XII: Pupils were equal round reactive to light. Extraocular movements were full, visual field were full  Motor: The motor  testing reveals 5 over 5 strength of all 4 extremities. Good symmetric motor tone is noted throughout. No tremor noted  Gait and station: Gait is normal.    DIAGNOSTIC DATA (LABS, IMAGING, TESTING) - I reviewed patient records, labs, notes, testing and imaging myself where available.      No data to display           Lab Results  Component Value Date   WBC 9.1 03/17/2022   HGB 13.7 03/17/2022   HCT 43.3 03/17/2022   MCV 94 03/17/2022   PLT 327 03/17/2022      Component Value Date/Time   NA 140 03/17/2022 1109   K 3.7 03/17/2022 1109   CL 102 03/17/2022 1109   CO2 27 03/17/2022 1109   GLUCOSE 153 (H) 03/17/2022 1109   GLUCOSE 113 (H) 05/29/2021 1147   BUN 19 03/17/2022 1109   CREATININE 1.25 (H) 03/17/2022 1109   CREATININE 1.11 (H) 12/31/2017 0850   CALCIUM 9.2 03/17/2022 1109   PROT 5.5 (L) 03/17/2022 1109   ALBUMIN 3.9 03/17/2022 1109   AST 17 03/17/2022 1109   ALT 27 03/17/2022 1109   ALKPHOS 85 03/17/2022 1109   BILITOT 0.5 03/17/2022 1109   GFRNONAA 34 (L) 05/29/2021 1147   GFRNONAA 43 (L) 08/05/2017 0807   GFRAA 39 (L) 08/26/2019 0901   GFRAA 50 (L) 08/05/2017 0807   Lab Results  Component Value Date   CHOL 166 08/05/2017   HDL 43 (L) 08/05/2017   LDLCALC 96 08/05/2017   TRIG 160 (H) 08/05/2017   CHOLHDL 3.9 08/05/2017   Lab Results  Component Value Date   HGBA1C 6.0 (A) 02/09/2022   Lab Results  Component Value Date   VITAMINB12 194 (L) 03/02/2012   Lab Results  Component Value Date   TSH 0.571 03/17/2022     ASSESSMENT AND PLAN 76 y.o. 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[place]) (12/08/2017), Arthritis, Atrial fibrillation and flutter ([place]), B12 deficiency anemia, Branch retinal vein occlusion with macular edema of right eye  (10/11/2019), Chronic renal failure, stage 3b ([place]) (09/23/2017), Coronary artery disease (11/30/2014), Cutaneous lupus erythematosus, Early stage nonexudative age-related macular degeneration of right eye (02/27/2020), Essential hypertension, GERD (gastroesophageal reflux disease), History of blood transfusion (1980's), Hypercholesteremia, Hypothyroidism, Myocardial infarction ([place]) (02/2012), NSTEMI (non-ST elevated myocardial infarction) ([place]) (04/02/2015), OSA (obstructive sleep apnea) (05/13/2016), Ovarian tumor, PAD (peripheral artery disease) ([place]), Pericardial effusion, Posterior vitreous detachment of right eye (10/11/2019), Posterior vitreous detachment of right eye (10/11/2019), Retinal microaneurysm of right eye (10/11/2019), S/P pericardiocentesis (06/28/2016), 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 here with   No diagnosis found.     [name] is doing well on CPAP therapy. Compliance report reveals acceptable compliance despite being unable to use for 2 weeks due to  ophthalmic surgery. She was encouraged to continue using CPAP nightly and for greater than 4 hours each night. We will update supply orders as indicated. Risks of untreated sleep apnea review and education materials provided. She will continue to follow up with care team. Tremor and neuropathy are stable. Healthy lifestyle habits encouraged. She will follow up in 1 year, sooner if needed. She verbalizes understanding and agreement with this plan.    No orders of the defined types were placed in this encounter.     No orders of the defined types were placed in this encounter.      Debbora Presto, FNP-C 05/06/2022, 11:36 AM [facility] [address], [place], [place] [address] [phone]

## 2022-05-07 ENCOUNTER — Ambulatory Visit: Payer: HMO | Admitting: Family Medicine

## 2022-05-07 ENCOUNTER — Ambulatory Visit (INDEPENDENT_AMBULATORY_CARE_PROVIDER_SITE_OTHER): Payer: HMO

## 2022-05-07 ENCOUNTER — Ambulatory Visit
Admission: EM | Admit: 2022-05-07 | Discharge: 2022-05-07 | Disposition: A | Discharge status: Home / Self Care | Payer: HMO | Attending: Nurse Practitioner | Admitting: Nurse Practitioner

## 2022-05-07 ENCOUNTER — Encounter: Payer: Self-pay | Admitting: Family Medicine

## 2022-05-07 VITALS — BP 172/79 | HR 83 | Ht 63.0 in | Wt 134.5 lb

## 2022-05-07 DIAGNOSIS — W19XXXA Unspecified fall, initial encounter: Secondary | ICD-10-CM

## 2022-05-07 DIAGNOSIS — S20212A Contusion of left front wall of thorax, initial encounter: Secondary | ICD-10-CM

## 2022-05-07 DIAGNOSIS — R079 Chest pain, unspecified: Secondary | ICD-10-CM | POA: Diagnosis not present

## 2022-05-07 DIAGNOSIS — G4733 Obstructive sleep apnea (adult) (pediatric): Secondary | ICD-10-CM

## 2022-05-07 NOTE — ED Triage Notes (Signed)
Pt states fell in a parking lot on Monday landing on lt side. Pt c/o lt rib pain, worse on breathing, and movement.

## 2022-05-07 NOTE — Discharge Instructions (Addendum)
The x-ray does not show a rib fracture.  As discussed, based on the mechanism of injury, your symptoms are consistent with a rib contusion. Continue Tylenol as needed for pain or discomfort. Apply ice or heat as needed.  Apply ice for pain or swelling, heat for spasm or stiffness.  Apply for 20 minutes, remove for 1 hour, then repeat is much as possible. Please be advised that your discomfort can last for several weeks.  If after 3 to 4 weeks, your symptoms are not improving, please follow-up with your primary care physician.  If you develop new symptoms such as chest pain, shortness of breath, difficulty breathing, or other concerns, please go to the emergency department. Follow-up as needed.

## 2022-05-07 NOTE — ED Provider Notes (Signed)
[facility]    CSN: [id number] Arrival date & time: 05/07/22  1344      History   Chief Complaint Chief Complaint  Patient presents with   Fall   Rib Injury    HPI Virginia Huber is a 76 y.o. female.   The history is provided by the patient.   Patient presents for complaints of left rib cage pain that is been present for the past 3 days after a fall.  Patient states that she landed on her left side after she fell in a parking lot.  She states since that time, she has had tenderness to the left rib cage, and pain with coughing, sneezing, movement and deep breathing.  She states that she has taken over-the-counter analgesics for her pain.  She states initially she thought that the area was swollen. She denies fever, chills, shortness of breath, difficulty breathing, bruising, or wheezing.  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 h/o PAF/flutter during admission in 2013 for PNA. b. PAF during adm for NSTEMI 07/2015, subsequent paroxysms since then.   B12 deficiency anemia    Branch retinal vein occlusion with macular edema of right eye 10/11/2019   Chronic renal failure, stage 3b ([place]) 09/23/2017   Coronary artery disease 11/30/2014   a. remote MI. b. h/o PTCA with scoring balloon to OM1 11/2014. c. NSTEMI 03/2015 s/p DES to prox-mid Cx. d. NSTEMI 07/2015 s/p scoring balloon/PTCA/DES to dRCA with PAF during that admission   Cutaneous lupus erythematosus    Early stage nonexudative age-related macular degeneration of right eye 02/27/2020   Essential hypertension    GERD (gastroesophageal reflux disease)    History of blood transfusion 1980's   2nd surgical  procedures   Hypercholesteremia    Hypothyroidism    Myocardial infarction ([place]) 02/2012   NSTEMI (non-ST elevated myocardial infarction) ([place]) 04/02/2015   OSA (obstructive sleep apnea) 05/13/2016   Ovarian tumor    PAD (peripheral artery disease) ([place])    a. s/p LE angio 2015; followed by Dr. Fletcher Anon - managed medically.   Pericardial effusion    a. 06/2016 after ppm - s/p pericardiocentesis.   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 s/p Medtr[employer] PPM 06/2016, c/b lead perf/pericardial effusion.   TIA (transient ischemic attack)    Type 2 diabetes with nephropathy ([place]) 02/29/2012    Patient Active Problem List   Diagnosis Date Noted   Stable hemispheric branch retinal vein occlusion (BRVO) of left eye 12/08/2021   History of vitrectomy 12/08/2021   History of colonic polyps 05/13/2021   Vitreous membranes and strands, right 01/21/2021   Advanced nonexudative age-related macular degeneration of left eye with subfoveal involvement 06/26/2020   Stable central retinal vein occlusion of left eye 05/13/2020   Early stage nonexudative age-related macular degeneration of right eye 02/27/2020   OSA on CPAP 02/12/2020   Branch retinal vein  occlusion with macular edema of right eye 10/11/2019   Severe nonproliferative diabetic retinopathy of right eye, with macular edema, associated with type 2 diabetes mellitus ([place]) 10/11/2019   Right retinal defect 10/11/2019   Retinal microaneurysm of right eye 10/11/2019   CHF exacerbation ([place]) 08/25/2019   Acute exacerbation of CHF (congestive heart failure) ([place]) 08/24/2019   Acute on chronic diastolic HF (heart failure) ([place])  12/17/2017   GERD (gastroesophageal reflux disease) 12/17/2017   Typical atrial flutter (HCC)    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   Paroxysmal atrial fibrillation ([place]) 12/08/2017   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   Chronic renal failure, stage 3b ([place]) 09/23/2017   Fever 09/23/2017   S/P pericardiocentesis 06/28/2016   Acute blood loss anemia 06/28/2016   Pericardial effusion 06/26/2016   Tachy-brady syndrome ([place]) 06/25/2016   Tamponade    Bradycardia 06/14/2016   Junctional bradycardia    Coronary artery disease involving coronary bypass graft of native heart with unstable angina pectoris (HCC)    Acute diastolic CHF (congestive heart failure), NYHA class 3 (HCC)    Systolic congestive heart failure ([place]) 05/13/2016   Multiple and bilateral precerebral artery syndromes 05/13/2016   OSA (obstructive sleep apnea) 05/13/2016   Chronic diarrhea 12/25/2015   Chest pain 08/02/2015   Atrial fibrillation with rapid ventricular response (HCC)    Pain with urination 05/08/2015   NSTEMI (non-ST elevated myocardial infarction) ([place]) 04/02/2015   CAD in native artery 11/30/2014   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    PAD (peripheral artery disease) ([place]) 12/26/2013   Superficial fungus infection of skin 06/29/2013   UTI (urinary tract infection) 05/08/2013   Hypokalemia 03/05/2012   B12 deficiency anemia 03/02/2012   Bronchospasm 03/02/2012   Community acquired bacterial pneumonia 03/01/2012   Acute respiratory failure with hypoxia ([place]) 03/01/2012   Type 2 diabetes with nephropathy ([place]) 02/29/2012   Hypothyroidism 02/28/2012   RLQ abdominal pain 11/24/2010   OVERWEIGHT/OBESITY 06/03/2010   Essential hypertension 06/03/2010   Overweight 06/03/2010   Mixed hyperlipidemia 12/27/2009   Palpitations 05/17/2009   FRACTURE, TOE 12/06/2007    Past Surgical History:  Procedure  Laterality Date   ABDOMINAL AORTAGRAM N/A 01/03/2014   Procedure: ABDOMINAL [name];  Surgeon: Wellington Hampshire, MD;  Location: [facility];  Service: Cardiovascular;  Laterality: N/A;   ABDOMINAL HYSTERECTOMY  06/22/1970   "partial"   APPENDECTOMY  02/20/1969   CARDIAC CATHETERIZATION  06/22/2006   Tiny OM-2 with 90% narrowing. Med tx.   CARDIAC CATHETERIZATION N/A 11/30/2014   Procedure: Left Heart Cath and Coronary Angiography;  Surgeon: Troy Sine, MD; LAD 20%, CFX 50%, OM1 95%, right PLB 30%, LV normal    CARDIAC CATHETERIZATION N/A 11/30/2014   Procedure: Coronary Balloon Angioplasty;  Surgeon: Troy Sine, MD;  Angiosculpt scoring balloon and PTCA to the OM1 reducing stenosis from 95% to less than 10%   CARDIAC CATHETERIZATION N/A 04/03/2015   Procedure: Left Heart Cath and Coronary Angiography;  Surgeon: Jolaine Artist, MD; dLAD 50%, CFX 90%, OM1 100%, PLA 15%, LVEDP 13     CARDIAC CATHETERIZATION N/A 04/03/2015   Procedure: Coronary Stent Intervention;  Surgeon: Sherren Mocha, MD; 3.0x18 mm Xience DES to the CFX     CARDIAC CATHETERIZATION N/A 08/02/2015   Procedure: Left Heart Cath and Coronary Angiography;  Surgeon: Troy Sine, MD;  Location: [facility];  Service: Cardiovascular;  Laterality: N/A;  CARDIAC CATHETERIZATION N/A 08/02/2015   Procedure: Coronary Stent Intervention;  Surgeon: Troy Sine, MD;  Location: [facility];  Service: Cardiovascular;  Laterality: N/A;   CARDIAC CATHETERIZATION N/A 06/25/2016   Procedure: Pericardiocentesis;  Surgeon: Will Meredith Leeds, MD;  Location: [facility];  Service: Cardiovascular;  Laterality: N/A;   cardiac stents     CARDIOVERSION N/A 12/15/2017   Procedure: CARDIOVERSION;  Surgeon: Acie Fredrickson Wonda Cheng, MD;  Location: [facility];  Service: Cardiovascular;  Laterality: N/A;   CHOLECYSTECTOMY OPEN  02/20/1989   COLONOSCOPY  06/23/2003   Dr. Laural Golden: pancolonic divericula, polyp, path unknown  currently   COLONOSCOPY  06/22/2010   Dr. Oneida Alar: Normal TI, scattered diverticula in entire colon, small internal hemorrhoids, normal colon biopsies. Colonoscopy in 5-10 years.    COLONOSCOPY WITH PROPOFOL N/A 06/02/2021   pancolonic diverticulosis. Two 4-6 mm polyps in transverse colon. Sessile serrated and hyperplastic. 5 year surveillance if benefits outweigh the risks.   COLOSTOMY  05/23/1979   COLOSTOMY REVERSAL  11/21/1979   EP IMPLANTABLE DEVICE N/A 06/25/2016   Procedure: Lead Revision/Repair;  Surgeon: Will Meredith Leeds, MD;  Location: [facility];  Service: Cardiovascular;  Laterality: N/A;   EP IMPLANTABLE DEVICE N/A 06/25/2016   Procedure: Pacemaker Implant;  Surgeon: Will Meredith Leeds, MD;  Location: [facility];  Service: Cardiovascular;  Laterality: N/A;   EXCISIONAL HEMORRHOIDECTOMY  02/20/1969   EYE SURGERY Left 06/22/1998   "branch vein occlusion"   EYE SURGERY Left ~ 2001   "smoothed out wrinkle"   LEFT OOPHORECTOMY  05/23/1979   nicked bowel, peritonitis, colostomy; colostomy reversed 1981    LOWER EXTREMITY ANGIOGRAM N/A 01/03/2014   Procedure: LOWER EXTREMITY ANGIOGRAM;  Surgeon: Wellington Hampshire, MD;  Location: [facility];  Service: Cardiovascular;  Laterality: N/A;   Nuclear med stress test  10/21/2011   Small area of mild ischemia inferoapically.   PARTIAL HYSTERECTOMY  02/20/1969   left ovaries, then ovaries removed later due tumors    POLYPECTOMY  06/02/2021   Procedure: POLYPECTOMY;  Surgeon: Eloise Harman, DO;  [facility];  Service: Endoscopy;;   right eye surgery  03/2021   RIGHT OOPHORECTOMY  02/20/1969    OB History     Gravida  3   Para  2   Term      Preterm      AB  1   Living  2      SAB  1   IAB      Ectopic      Multiple      Live Births  2            Home Medications    Prior to Admission medications   Medication Sig Start Date End Date Taking? Authorizing Provider   acetaminophen (TYLENOL) 325 MG tablet Take 650 mg by mouth every 6 (six) hours as needed for headache.    [provider]  amiodarone (PACERONE) 200 MG tablet Take 0.5 tablets (100 mg total) by mouth daily. 08/15/19   Camnitz, Ocie Doyne, MD  apixaban (ELIQUIS) 5 MG TABS tablet TAKE ONE TABLET BY MOUTH TWICE DAILY 04/06/22   Satira Sark, MD  atorvastatin (LIPITOR) 80 MG tablet TAKE ONE TABLET BY MOUTH EVERY DAY AT 6:00PM 10/06/18   Herminio Commons, MD  calcitRIOL (ROCALTROL) 0.25 MCG capsule Take 0.25 mcg by mouth 3 (three) times a week. 12/24/21   [provider]  carvedilol (COREG) 6.25  MG tablet Take 1 tablet (6.25 mg total) by mouth 2 (two) times daily. 04/14/22   Imogene Burn, PA-C  Cholecalciferol (VITAMIN D3 PO) Take 2,000 Units by mouth every evening.    [provider]  clopidogrel (PLAVIX) 75 MG tablet TAKE ONE TABLET ('75MG'$  TOTAL) BY MOUTH DAILY 03/20/19   Herminio Commons, MD  Continuous Blood Gluc Receiver (FREESTYLE LIBRE 2 READER) DEVI 1 each by Does not apply route daily. 06/24/20   Philemon Kingdom, MD  Continuous Blood Gluc Sensor (FREESTYLE LIBRE 2 SENSOR) MISC USE ONE FOR FOURTEEN DAYS 03/30/22   Philemon Kingdom, MD  glucose blood (FREESTYLE PRECISION NEO TEST) test strip Use as instructed 05/23/21   Philemon Kingdom, MD  Insulin NPH, Human,, Isophane, (NOVOLIN N FLEXPEN) 100 UNIT/ML [place] INJECT 25 UNITS in [place] 15 UNITS BEFORE dinner 04/06/22   Philemon Kingdom, MD  insulin NPH-regular Human (NOVOLIN 70/30) (70-30) 100 UNIT/ML injection INJECT 25 UNITS BEFORE BREAKFAST AND 15 UNITS BEFORE SUPPER 02/03/22   Philemon Kingdom, MD  levothyroxine (SYNTHROID) 112 MCG tablet TAKE ONE TABLET BY MOUTH ONCE DAILY 04/06/22   Philemon Kingdom, MD  nitroGLYCERIN (NITROSTAT) 0.4 MG SL tablet Place 1 tablet (0.4 mg total) under the tongue every 5 (five) minutes as needed for chest pain. 11/25/17   Rolland Porter, MD  olmesartan (BENICAR) 5 MG  tablet Take 5 mg by mouth in the morning and at bedtime. 02/27/22   [provider]  rOPINIRole (REQUIP) 0.5 MG tablet Take 0.5 mg by mouth at bedtime.  03/05/14   [provider]  Semaglutide, 2 MG/DOSE, 8 MG/3ML SOPN Inject 2 mg as directed once a week. 06/06/21   Philemon Kingdom, MD    Family History Family History  Problem Relation Age of Onset   Heart disease Mother        deceased   Heart disease Father        deceased, heart disease   Diabetes Brother    Heart disease Brother    Thyroid disease Brother    Heart disease Sister    Heart disease Brother    Thyroid disease Brother    Lupus Daughter    Colon cancer Neg Hx     Social History Social History   Tobacco Use   Smoking status: Never    Passive exposure: Never   Smokeless tobacco: Never   Tobacco comments:    Never smoked  Vaping Use   Vaping Use: Never used  Substance Use Topics   Alcohol use: No    Alcohol/week: 0.0 standard drinks of alcohol   Drug use: No     Allergies   Penicillins and Percocet [oxycodone-acetaminophen]   Review of Systems Review of Systems Per HPI  Physical Exam Triage Vital Signs ED Triage Vitals [05/07/22 1423]  Enc Vitals Group     BP (!) 184/103     Pulse Rate 73     Resp 18     Temp 98 F (36.7 C)     Temp Source Oral     SpO2 99 %     Weight      Height      Head Circumference      Peak Flow      Pain Score 5     Pain Loc      Pain Edu?      Excl. in [place]?    No data found.  Updated Vital Signs BP (!) 184/103 (BP Location: Left Arm)  Pulse 73   Temp 98 F (36.7 C) (Oral)   Resp 18   SpO2 99%   Visual Acuity Right Eye Distance:   Left Eye Distance:   Bilateral Distance:    Right Eye Near:   Left Eye Near:    Bilateral Near:     Physical Exam Vitals and nursing note reviewed.  Constitutional:      General: She is not in acute distress.    Appearance: Normal appearance.  Cardiovascular:     Rate and Rhythm: Normal rate and  regular rhythm.     Pulses: Normal pulses.     Heart sounds: Normal heart sounds.  Pulmonary:     Effort: Pulmonary effort is normal. No tachypnea, bradypnea, accessory muscle usage, prolonged expiration or respiratory distress.     Breath sounds: Normal breath sounds. No stridor. No wheezing, rhonchi or rales.  Chest:     Chest wall: Tenderness (Tenderness to the anterior left rib cage at ribs 6 and 7.) present.  Breasts:    Left: Normal. No tenderness.  Abdominal:     General: Bowel sounds are normal.     Palpations: Abdomen is soft.  Musculoskeletal:     Cervical back: Normal range of motion.  Skin:    General: Skin is warm and dry.  Neurological:     General: No focal deficit present.     Mental Status: She is alert and oriented to person, place, and time.  Psychiatric:        Mood and Affect: Mood normal.        Behavior: Behavior normal.      UC Treatments / Results  Labs (all labs ordered are listed, but only abnormal results are displayed) Labs Reviewed - No data to display  EKG   Radiology DG Ribs Unilateral W/Chest Left  Result Date: 05/07/2022 CLINICAL DATA:  Recent fall, left chest pain EXAM: LEFT RIBS AND CHEST - 3+ VIEW COMPARISON:  10/24/2019 FINDINGS: Cardiac size is within normal limits. Pacemaker battery is seen in left infraclavicular region. Surgical clips are seen in right upper quadrant. There are no signs of pulmonary edema or focal pulmonary consolidation. There is interval clearing of pulmonary vascular congestion and pulmonary edema. There is no pleural effusion or pneumothorax. No displaced fractures are seen in left ribs. IMPRESSION: No displaced fractures are seen in left ribs. There are no signs of pulmonary edema or focal pulmonary consolidation. There is no pleural effusion or pneumothorax. Electronically Signed   By: Elmer Picker M.D.   On: 05/07/2022 15:13    Procedures Procedures (including critical care time)  Medications Ordered  in UC Medications - No data to display  Initial Impression / Assessment and Plan / UC Course  I have reviewed the triage vital signs and the nursing notes.  Pertinent labs & imaging results that were available during my care of the patient were reviewed by me and considered in my medical decision making (see chart for details).  The patient is well-appearing, she is in no acute distress, vital signs are stable.  Lung sounds are clear throughout.  There are no signs of respiratory difficulty or distress.  Rib contusion, left, initial encounter;  X-ray is negative for a rib fracture.  Based on the mechanism of injury, symptoms are consistent with a rib contusion. Continue Tylenol as needed for pain or discomfort.  Apply ice or heat as needed. Strict ER precautions were provided to the patient along with indications of when to follow-up with  her primary care physician. Follow-up as needed.  Patient verbalizes understanding.  All questions were answered.  Patient is stable for discharge. Final Clinical Impressions(s) / UC Diagnoses   Final diagnoses:  Rib contusion, left, initial encounter     Discharge Instructions      The x-ray does not show a rib fracture.  As discussed, based on the mechanism of injury, your symptoms are consistent with a rib contusion. Continue Tylenol as needed for pain or discomfort. Apply ice or heat as needed.  Apply ice for pain or swelling, heat for spasm or stiffness.  Apply for 20 minutes, remove for 1 hour, then repeat is much as possible. Please be advised that your discomfort can last for several weeks.  If after 3 to 4 weeks, your symptoms are not improving, please follow-up with your primary care physician.  If you develop new symptoms such as chest pain, shortness of breath, difficulty breathing, or other concerns, please go to the emergency department. Follow-up as needed.     ED Prescriptions   None    PDMP not reviewed this encounter.    Tish Men, NP 05/07/22 1542

## 2022-05-09 ENCOUNTER — Other Ambulatory Visit: Payer: Self-pay

## 2022-05-09 ENCOUNTER — Encounter (HOSPITAL_COMMUNITY): Payer: Self-pay | Admitting: Emergency Medicine

## 2022-05-09 ENCOUNTER — Inpatient Hospital Stay (HOSPITAL_COMMUNITY)
Admission: EM | Admit: 2022-05-09 | Discharge: 2022-05-12 | DRG: 287 | Disposition: A | Discharge status: Home / Self Care | Payer: HMO | Attending: Family Medicine | Admitting: Family Medicine

## 2022-05-09 ENCOUNTER — Emergency Department (HOSPITAL_COMMUNITY): Payer: HMO

## 2022-05-09 ENCOUNTER — Encounter (HOSPITAL_COMMUNITY): Payer: Self-pay

## 2022-05-09 DIAGNOSIS — E1122 Type 2 diabetes mellitus with diabetic chronic kidney disease: Secondary | ICD-10-CM | POA: Diagnosis present

## 2022-05-09 DIAGNOSIS — G251 Drug-induced tremor: Secondary | ICD-10-CM | POA: Diagnosis present

## 2022-05-09 DIAGNOSIS — Z79890 Hormone replacement therapy (postmenopausal): Secondary | ICD-10-CM

## 2022-05-09 DIAGNOSIS — M1990 Unspecified osteoarthritis, unspecified site: Secondary | ICD-10-CM | POA: Diagnosis present

## 2022-05-09 DIAGNOSIS — I340 Nonrheumatic mitral (valve) insufficiency: Secondary | ICD-10-CM | POA: Diagnosis not present

## 2022-05-09 DIAGNOSIS — R079 Chest pain, unspecified: Secondary | ICD-10-CM | POA: Diagnosis not present

## 2022-05-09 DIAGNOSIS — I495 Sick sinus syndrome: Secondary | ICD-10-CM | POA: Diagnosis present

## 2022-05-09 DIAGNOSIS — Z7985 Long-term (current) use of injectable non-insulin antidiabetic drugs: Secondary | ICD-10-CM

## 2022-05-09 DIAGNOSIS — G4733 Obstructive sleep apnea (adult) (pediatric): Secondary | ICD-10-CM | POA: Diagnosis present

## 2022-05-09 DIAGNOSIS — I2089 Other forms of angina pectoris: Secondary | ICD-10-CM | POA: Diagnosis present

## 2022-05-09 DIAGNOSIS — I5042 Chronic combined systolic (congestive) and diastolic (congestive) heart failure: Secondary | ICD-10-CM | POA: Diagnosis present

## 2022-05-09 DIAGNOSIS — I110 Hypertensive heart disease with heart failure: Secondary | ICD-10-CM | POA: Diagnosis not present

## 2022-05-09 DIAGNOSIS — I480 Paroxysmal atrial fibrillation: Secondary | ICD-10-CM

## 2022-05-09 DIAGNOSIS — E039 Hypothyroidism, unspecified: Secondary | ICD-10-CM | POA: Diagnosis present

## 2022-05-09 DIAGNOSIS — Z634 Disappearance and death of family member: Secondary | ICD-10-CM

## 2022-05-09 DIAGNOSIS — I700 Atherosclerosis of aorta: Secondary | ICD-10-CM | POA: Diagnosis not present

## 2022-05-09 DIAGNOSIS — I5032 Chronic diastolic (congestive) heart failure: Secondary | ICD-10-CM | POA: Insufficient documentation

## 2022-05-09 DIAGNOSIS — E1121 Type 2 diabetes mellitus with diabetic nephropathy: Secondary | ICD-10-CM | POA: Diagnosis not present

## 2022-05-09 DIAGNOSIS — Z950 Presence of cardiac pacemaker: Secondary | ICD-10-CM

## 2022-05-09 DIAGNOSIS — Z794 Long term (current) use of insulin: Secondary | ICD-10-CM | POA: Diagnosis not present

## 2022-05-09 DIAGNOSIS — I5020 Unspecified systolic (congestive) heart failure: Secondary | ICD-10-CM | POA: Diagnosis not present

## 2022-05-09 DIAGNOSIS — I130 Hypertensive heart and chronic kidney disease with heart failure and stage 1 through stage 4 chronic kidney disease, or unspecified chronic kidney disease: Secondary | ICD-10-CM | POA: Diagnosis present

## 2022-05-09 DIAGNOSIS — I10 Essential (primary) hypertension: Secondary | ICD-10-CM | POA: Diagnosis present

## 2022-05-09 DIAGNOSIS — I483 Typical atrial flutter: Secondary | ICD-10-CM

## 2022-05-09 DIAGNOSIS — I429 Cardiomyopathy, unspecified: Secondary | ICD-10-CM | POA: Diagnosis present

## 2022-05-09 DIAGNOSIS — Z955 Presence of coronary angioplasty implant and graft: Secondary | ICD-10-CM

## 2022-05-09 DIAGNOSIS — I4891 Unspecified atrial fibrillation: Secondary | ICD-10-CM | POA: Diagnosis not present

## 2022-05-09 DIAGNOSIS — I200 Unstable angina: Secondary | ICD-10-CM

## 2022-05-09 DIAGNOSIS — T462X5A Adverse effect of other antidysrhythmic drugs, initial encounter: Secondary | ICD-10-CM | POA: Diagnosis present

## 2022-05-09 DIAGNOSIS — I484 Atypical atrial flutter: Secondary | ICD-10-CM | POA: Diagnosis present

## 2022-05-09 DIAGNOSIS — L932 Other local lupus erythematosus: Secondary | ICD-10-CM | POA: Diagnosis present

## 2022-05-09 DIAGNOSIS — Z7902 Long term (current) use of antithrombotics/antiplatelets: Secondary | ICD-10-CM

## 2022-05-09 DIAGNOSIS — Z8673 Personal history of transient ischemic attack (TIA), and cerebral infarction without residual deficits: Secondary | ICD-10-CM

## 2022-05-09 DIAGNOSIS — I25118 Atherosclerotic heart disease of native coronary artery with other forms of angina pectoris: Secondary | ICD-10-CM | POA: Diagnosis not present

## 2022-05-09 DIAGNOSIS — E782 Mixed hyperlipidemia: Secondary | ICD-10-CM | POA: Diagnosis present

## 2022-05-09 DIAGNOSIS — E1151 Type 2 diabetes mellitus with diabetic peripheral angiopathy without gangrene: Secondary | ICD-10-CM | POA: Diagnosis present

## 2022-05-09 DIAGNOSIS — K5730 Diverticulosis of large intestine without perforation or abscess without bleeding: Secondary | ICD-10-CM | POA: Diagnosis present

## 2022-05-09 DIAGNOSIS — I25110 Atherosclerotic heart disease of native coronary artery with unstable angina pectoris: Principal | ICD-10-CM | POA: Diagnosis present

## 2022-05-09 DIAGNOSIS — I252 Old myocardial infarction: Secondary | ICD-10-CM

## 2022-05-09 DIAGNOSIS — Z79899 Other long term (current) drug therapy: Secondary | ICD-10-CM

## 2022-05-09 DIAGNOSIS — I4819 Other persistent atrial fibrillation: Secondary | ICD-10-CM | POA: Diagnosis present

## 2022-05-09 DIAGNOSIS — N1832 Chronic kidney disease, stage 3b: Secondary | ICD-10-CM | POA: Diagnosis present

## 2022-05-09 DIAGNOSIS — I4892 Unspecified atrial flutter: Secondary | ICD-10-CM | POA: Diagnosis not present

## 2022-05-09 DIAGNOSIS — Z880 Allergy status to penicillin: Secondary | ICD-10-CM

## 2022-05-09 DIAGNOSIS — I2582 Chronic total occlusion of coronary artery: Secondary | ICD-10-CM | POA: Diagnosis present

## 2022-05-09 DIAGNOSIS — Z888 Allergy status to other drugs, medicaments and biological substances status: Secondary | ICD-10-CM

## 2022-05-09 DIAGNOSIS — E876 Hypokalemia: Secondary | ICD-10-CM | POA: Diagnosis present

## 2022-05-09 DIAGNOSIS — Z7901 Long term (current) use of anticoagulants: Secondary | ICD-10-CM

## 2022-05-09 DIAGNOSIS — Z90711 Acquired absence of uterus with remaining cervical stump: Secondary | ICD-10-CM

## 2022-05-09 DIAGNOSIS — Z833 Family history of diabetes mellitus: Secondary | ICD-10-CM

## 2022-05-09 DIAGNOSIS — I2510 Atherosclerotic heart disease of native coronary artery without angina pectoris: Secondary | ICD-10-CM | POA: Diagnosis not present

## 2022-05-09 DIAGNOSIS — Z8349 Family history of other endocrine, nutritional and metabolic diseases: Secondary | ICD-10-CM

## 2022-05-09 DIAGNOSIS — Z90721 Acquired absence of ovaries, unilateral: Secondary | ICD-10-CM

## 2022-05-09 DIAGNOSIS — I509 Heart failure, unspecified: Secondary | ICD-10-CM | POA: Diagnosis not present

## 2022-05-09 DIAGNOSIS — I081 Rheumatic disorders of both mitral and tricuspid valves: Secondary | ICD-10-CM | POA: Diagnosis not present

## 2022-05-09 DIAGNOSIS — Z8249 Family history of ischemic heart disease and other diseases of the circulatory system: Secondary | ICD-10-CM

## 2022-05-09 DIAGNOSIS — N179 Acute kidney failure, unspecified: Secondary | ICD-10-CM | POA: Diagnosis present

## 2022-05-09 DIAGNOSIS — G219 Secondary parkinsonism, unspecified: Secondary | ICD-10-CM | POA: Diagnosis present

## 2022-05-09 HISTORY — DX: Presence of cardiac pacemaker: Z95.0

## 2022-05-09 LAB — BASIC METABOLIC PANEL
Anion gap: 5 (ref 5–15)
BUN: 14 mg/dL (ref 8–23)
CO2: 24 mmol/L (ref 22–32)
Calcium: 8 mg/dL — ABNORMAL LOW (ref 8.9–10.3)
Chloride: 113 mmol/L — ABNORMAL HIGH (ref 98–111)
Creatinine, Ser: 1.09 mg/dL — ABNORMAL HIGH (ref 0.44–1.00)
GFR, Estimated: 53 mL/min — ABNORMAL LOW (ref >60)
Glucose, Bld: 114 mg/dL — ABNORMAL HIGH (ref 70–99)
Potassium: 2.9 mmol/L — ABNORMAL LOW (ref 3.5–5.1)
Sodium: 142 mmol/L (ref 135–145)

## 2022-05-09 LAB — CBC
HCT: 41.9 % (ref 36.0–46.0)
Hemoglobin: 13.4 g/dL (ref 12.0–15.0)
MCH: 30.4 pg (ref 26.0–34.0)
MCHC: 32 g/dL (ref 30.0–36.0)
MCV: 95 fL (ref 80.0–100.0)
Platelets: 238 10*3/uL (ref 150–400)
RBC: 4.41 MIL/uL (ref 3.87–5.11)
RDW: 15.5 % (ref 11.5–15.5)
WBC: 8.3 10*3/uL (ref 4.0–10.5)
nRBC: 0 % (ref 0.0–0.2)

## 2022-05-09 LAB — TROPONIN I (HIGH SENSITIVITY)
Troponin I (High Sensitivity): 20 ng/L — ABNORMAL HIGH (ref <18)
Troponin I (High Sensitivity): 24 ng/L — ABNORMAL HIGH (ref <18)

## 2022-05-09 LAB — CBG MONITORING, ED: Glucose-Capillary: 91 mg/dL (ref 70–99)

## 2022-05-09 LAB — APTT: aPTT: 56 seconds — ABNORMAL HIGH (ref 24–36)

## 2022-05-09 MED ORDER — ACETAMINOPHEN 325 MG PO TABS: 650.0000 mg | ORAL_TABLET | Freq: Four times a day (QID) | ORAL | Status: DC | PRN

## 2022-05-09 MED ORDER — ONDANSETRON HCL 4 MG/2ML IJ SOLN: 4.0000 mg | Freq: Four times a day (QID) | INTRAMUSCULAR | Status: DC | PRN

## 2022-05-09 MED ORDER — AMIODARONE HCL 200 MG PO TABS
100.0000 mg | ORAL_TABLET | Freq: Every day | ORAL | Status: DC
  Administered 2022-05-09 – 2022-05-12 (×4): 100 mg via ORAL
  Filled 2022-05-09 (×4): qty 1

## 2022-05-09 MED ORDER — LEVOTHYROXINE SODIUM 112 MCG PO TABS
112.0000 ug | ORAL_TABLET | Freq: Every day | ORAL | Status: DC
  Administered 2022-05-10 – 2022-05-12 (×3): 112 ug via ORAL
  Filled 2022-05-09 (×3): qty 1

## 2022-05-09 MED ORDER — CARVEDILOL 6.25 MG PO TABS
6.2500 mg | ORAL_TABLET | Freq: Two times a day (BID) | ORAL | Status: DC
  Administered 2022-05-09: 6.25 mg via ORAL
  Filled 2022-05-09: qty 1

## 2022-05-09 MED ORDER — CARVEDILOL 12.5 MG PO TABS
12.5000 mg | ORAL_TABLET | Freq: Two times a day (BID) | ORAL | Status: DC
  Administered 2022-05-10 – 2022-05-12 (×5): 12.5 mg via ORAL
  Filled 2022-05-09 (×5): qty 1

## 2022-05-09 MED ORDER — ONDANSETRON HCL 4 MG PO TABS: 4.0000 mg | ORAL_TABLET | Freq: Four times a day (QID) | ORAL | Status: DC | PRN

## 2022-05-09 MED ORDER — HEPARIN SODIUM (PORCINE) 5000 UNIT/ML IJ SOLN: 5000.0000 [IU] | Freq: Three times a day (TID) | INTRAMUSCULAR | Status: DC

## 2022-05-09 MED ORDER — CARVEDILOL 3.125 MG PO TABS
6.2500 mg | ORAL_TABLET | Freq: Once | ORAL | Status: AC
  Administered 2022-05-09: 6.25 mg via ORAL
  Filled 2022-05-09: qty 2

## 2022-05-09 MED ORDER — VITAMIN D 25 MCG (1000 UNIT) PO TABS
2000.0000 [IU] | ORAL_TABLET | Freq: Every evening | ORAL | Status: DC
  Administered 2022-05-09 – 2022-05-12 (×4): 2000 [IU] via ORAL
  Filled 2022-05-09 (×4): qty 2

## 2022-05-09 MED ORDER — CALCITRIOL 0.25 MCG PO CAPS
0.2500 ug | ORAL_CAPSULE | ORAL | Status: DC
  Administered 2022-05-11: 0.25 ug via ORAL
  Filled 2022-05-09: qty 1

## 2022-05-09 MED ORDER — POTASSIUM CHLORIDE CRYS ER 20 MEQ PO TBCR
40.0000 meq | EXTENDED_RELEASE_TABLET | Freq: Once | ORAL | Status: AC
  Administered 2022-05-09: 40 meq via ORAL
  Filled 2022-05-09: qty 2

## 2022-05-09 MED ORDER — ATORVASTATIN CALCIUM 80 MG PO TABS
80.0000 mg | ORAL_TABLET | Freq: Every day | ORAL | Status: DC
  Administered 2022-05-09 – 2022-05-12 (×4): 80 mg via ORAL
  Filled 2022-05-09 (×4): qty 1

## 2022-05-09 MED ORDER — ACETAMINOPHEN 650 MG RE SUPP: 650.0000 mg | Freq: Four times a day (QID) | RECTAL | Status: DC | PRN

## 2022-05-09 MED ORDER — SODIUM CHLORIDE 0.9% FLUSH
3.0000 mL | Freq: Two times a day (BID) | INTRAVENOUS | Status: DC
  Administered 2022-05-11 (×2): 3 mL via INTRAVENOUS

## 2022-05-09 MED ORDER — ROPINIROLE HCL 0.5 MG PO TABS
0.5000 mg | ORAL_TABLET | Freq: Every day | ORAL | Status: DC
  Administered 2022-05-09 – 2022-05-11 (×3): 0.5 mg via ORAL
  Filled 2022-05-09 (×3): qty 1

## 2022-05-09 MED ORDER — HEPARIN (PORCINE) 25000 UT/250ML-% IV SOLN
850.0000 [IU]/h | INTRAVENOUS | Status: DC
  Administered 2022-05-09: 700 [IU]/h via INTRAVENOUS
  Administered 2022-05-10: 850 [IU]/h via INTRAVENOUS
  Filled 2022-05-09 (×2): qty 250

## 2022-05-09 MED ORDER — POTASSIUM CHLORIDE 10 MEQ/100ML IV SOLN
10.0000 meq | Freq: Once | INTRAVENOUS | Status: AC
  Administered 2022-05-09: 10 meq via INTRAVENOUS
  Filled 2022-05-09: qty 100

## 2022-05-09 MED ORDER — AMIODARONE HCL 200 MG PO TABS: 100.0000 mg | ORAL_TABLET | Freq: Every day | ORAL | Status: DC

## 2022-05-09 MED ORDER — IRBESARTAN 75 MG PO TABS
37.5000 mg | ORAL_TABLET | Freq: Every day | ORAL | Status: DC
  Administered 2022-05-09 – 2022-05-12 (×4): 37.5 mg via ORAL
  Filled 2022-05-09 (×4): qty 1

## 2022-05-09 MED ORDER — NITROGLYCERIN 0.4 MG SL SUBL
0.4000 mg | SUBLINGUAL_TABLET | Freq: Once | SUBLINGUAL | Status: AC
  Administered 2022-05-09: 0.4 mg via SUBLINGUAL
  Filled 2022-05-09: qty 1

## 2022-05-09 MED ORDER — CLOPIDOGREL BISULFATE 75 MG PO TABS
75.0000 mg | ORAL_TABLET | Freq: Every day | ORAL | Status: DC
  Administered 2022-05-09 – 2022-05-12 (×4): 75 mg via ORAL
  Filled 2022-05-09 (×4): qty 1

## 2022-05-09 MED ORDER — CLOPIDOGREL BISULFATE 75 MG PO TABS: 75.0000 mg | ORAL_TABLET | Freq: Every day | ORAL | Status: DC

## 2022-05-09 MED ORDER — NITROGLYCERIN 0.4 MG SL SUBL
0.4000 mg | SUBLINGUAL_TABLET | Freq: Once | SUBLINGUAL | Status: AC
  Administered 2022-05-09: 0.4 mg via SUBLINGUAL

## 2022-05-09 NOTE — Assessment & Plan Note (Signed)
Continue amiodarone Holding apixaban in preparation for procedure per cardiology request

## 2022-05-09 NOTE — ED Triage Notes (Signed)
Pt with c/o chest pain that started around 4am. Pt states she has a pacemaker and 2 stents and is scheduled for an ablation in Feb.

## 2022-05-09 NOTE — H&P (Signed)
History and Physical    Patient: Virginia Huber GGE:[id number] DOB: Oct 15, 1945 DOA: 05/09/2022 DOS: the patient was seen and examined on 05/09/2022 PCP: Sharilyn Sites, MD  Patient coming from: Home  Chief Complaint:  Chief Complaint  Patient presents with   Chest Pain   HPI: Virginia Huber is a 76 year old female with a history of diabetes mellitus type 2, hypertension, hyperlipidemia, CKD stage III, paroxysmal atrial fibrillation, HFpEF, coronary disease status post angioplasty of the OM1 in 2016, DES to the circumflex in 2016, and DES to the RCA in 2017 presenting with substernal chest pain that woke her up in the early morning 05/09/2022.  The patient has some shortness of breath and diaphoresis.  She denies any dizziness or syncope.  She denies any nausea, vomiting.  She has not had any fevers, chills, coughing, hemoptysis.  Notably, the patient had a mechanical fall about 5 days prior to this admission.  She went to urgent care on 05/07/2022 because of left-sided rib pain.  X-rays of her ribs at that time were negative for any fractures.  She continues to have pleuritic rib pain on the left side. In the ED, the patient was afebrile and hemodynamically stable with oxygen saturation 99% room air.  Heart rate was mildly elevated 105-110.  WBC 8.3, hemoglobin 13.4, platelets 230,000.  Sodium 142, potassium 2.9, bicarbonate 24, serum creatinine 1.09.  Troponin 20>>24.  EKG showed sinus tachycardia with IVCD.  Chest x-ray was negative for any infiltrates or edema.  Cardiology was consulted, Dr. Stanford Breed who recommended transfer to [facility] for evaluation. Notably, the patient was given nitroglycerin x1 in the ED with relief of her chest discomfort.  Her chest discomfort subsequently returned and she was given a second nitroglycerin with relief once again.  Review of Systems: As mentioned in the history of present illness. All other systems reviewed and are negative. 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 h/o PAF/flutter during admission in 2013 for PNA. b. PAF during adm for NSTEMI 07/2015, subsequent paroxysms since then.   B12 deficiency anemia    Branch retinal vein occlusion with macular edema of right eye 10/11/2019   Chronic renal failure, stage 3b ([place]) 09/23/2017   Coronary artery disease 11/30/2014   a. remote MI. b. h/o PTCA with scoring balloon to OM1 11/2014. c. NSTEMI 03/2015 s/p DES to prox-mid Cx. d. NSTEMI 07/2015 s/p scoring balloon/PTCA/DES to dRCA with PAF during that admission   Cutaneous lupus erythematosus    Early stage nonexudative age-related macular degeneration of right eye 02/27/2020   Essential hypertension    GERD (gastroesophageal reflux disease)    History of blood transfusion 1980's   2nd surgical procedures   Hypercholesteremia    Hypothyroidism    Myocardial infarction ([place]) 02/2012   NSTEMI (non-ST elevated myocardial infarction) ([place]) 04/02/2015   OSA (obstructive sleep apnea) 05/13/2016   Ovarian tumor    PAD (peripheral artery disease) ([place])    a. s/p LE angio 2015; followed by Dr. Fletcher Anon - managed medically.   Pericardial effusion    a. 06/2016 after ppm - s/p pericardiocentesis.   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 s/p Medtronic PPM 06/2016, c/b lead perf/pericardial effusion.   TIA (transient ischemic  attack)    Type 2 diabetes with nephropathy ([place]) 02/29/2012   Past Surgical History:  Procedure Laterality Date   ABDOMINAL AORTAGRAM N/A 01/03/2014   Procedure: ABDOMINAL [name];  Surgeon: Wellington Hampshire, MD;  Location: [place] CATH LAB;  Service: Cardiovascular;  Laterality: N/A;   ABDOMINAL HYSTERECTOMY  06/22/1970   "partial"   APPENDECTOMY  02/20/1969   CARDIAC CATHETERIZATION  06/22/2006   Tiny OM-2 with 90% narrowing. Med tx.   CARDIAC CATHETERIZATION N/A 11/30/2014   Procedure: Left Heart Cath and Coronary Angiography;  Surgeon: Troy Sine, MD; LAD 20%, CFX 50%, OM1 95%, right PLB 30%, LV normal    CARDIAC CATHETERIZATION N/A 11/30/2014   Procedure: Coronary Balloon Angioplasty;  Surgeon: Troy Sine, MD;  Angiosculpt scoring balloon and PTCA to the OM1 reducing stenosis from 95% to less than 10%   CARDIAC CATHETERIZATION N/A 04/03/2015   Procedure: Left Heart Cath and Coronary Angiography;  Surgeon: Jolaine Artist, MD; dLAD 50%, CFX 90%, OM1 100%, PLA 15%, LVEDP 13     CARDIAC CATHETERIZATION N/A 04/03/2015   Procedure: Coronary Stent Intervention;  Surgeon: Sherren Mocha, MD; 3.0x18 mm Xience DES to the CFX     CARDIAC CATHETERIZATION N/A 08/02/2015   Procedure: Left Heart Cath and Coronary Angiography;  Surgeon: Troy Sine, MD;  Location: [place] CV LAB;  Service: Cardiovascular;  Laterality: N/A;   CARDIAC CATHETERIZATION N/A 08/02/2015   Procedure: Coronary Stent Intervention;  Surgeon: Troy Sine, MD;  Location: [place] CV LAB;  Service: Cardiovascular;  Laterality: N/A;   CARDIAC CATHETERIZATION N/A 06/25/2016   Procedure: Pericardiocentesis;  Surgeon: Will Meredith Leeds, MD;  Location: [place] CV LAB;  Service: Cardiovascular;  Laterality: N/A;   cardiac stents     CARDIOVERSION N/A 12/15/2017   Procedure: CARDIOVERSION;  Surgeon: Acie Fredrickson Wonda Cheng, MD;  Location: [facility] ENDOSCOPY;  Service: Cardiovascular;  Laterality: N/A;   CHOLECYSTECTOMY  OPEN  02/20/1989   COLONOSCOPY  06/23/2003   Dr. Laural Golden: pancolonic divericula, polyp, path unknown currently   COLONOSCOPY  06/22/2010   Dr. Oneida Alar: Normal TI, scattered diverticula in entire colon, small internal hemorrhoids, normal colon biopsies. Colonoscopy in 5-10 years.    COLONOSCOPY WITH PROPOFOL N/A 06/02/2021   pancolonic diverticulosis. Two 4-6 mm polyps in transverse colon. Sessile serrated and hyperplastic. 5 year surveillance if benefits outweigh the risks.   COLOSTOMY  05/23/1979   COLOSTOMY REVERSAL  11/21/1979   EP IMPLANTABLE DEVICE N/A 06/25/2016   Procedure: Lead Revision/Repair;  Surgeon: Will Meredith Leeds, MD;  Location: [place] [facility] LAB;  Service: Cardiovascular;  Laterality: N/A;   EP IMPLANTABLE DEVICE N/A 06/25/2016   Procedure: Pacemaker Implant;  Surgeon: Will Meredith Leeds, MD;  Location: [place] [facility];  Service: Cardiovascular;  Laterality: N/A;   EXCISIONAL HEMORRHOIDECTOMY  02/20/1969   EYE SURGERY Left 06/22/1998   "branch vein occlusion"   EYE SURGERY Left ~ 2001   "smoothed out wrinkle"   LEFT OOPHORECTOMY  05/23/1979   nicked bowel, peritonitis, colostomy; colostomy reversed 1981    LOWER EXTREMITY ANGIOGRAM N/A 01/03/2014   Procedure: LOWER EXTREMITY ANGIOGRAM;  Surgeon: Wellington Hampshire, MD;  Location: [facility] CATH LAB;  Service: Cardiovascular;  Laterality: N/A;   Nuclear med stress test  10/21/2011   Small area of mild ischemia inferoapically.   PARTIAL HYSTERECTOMY  02/20/1969   left ovaries, then ovaries removed later due tumors    POLYPECTOMY  06/02/2021   Procedure: POLYPECTOMY;  Surgeon: Eloise Harman, DO;  [facility];  Service: Endoscopy;;   right eye surgery  03/2021   RIGHT OOPHORECTOMY  02/20/1969   Social History:  reports that she has never smoked. She has never been exposed to tobacco smoke. She has never used smokeless tobacco. She reports that she does not drink alcohol and does not use drugs.  Allergies   Allergen Reactions   Penicillins Hives    Has patient had a PCN reaction causing immediate rash, facial/tongue/throat swelling, SOB or lightheadedness with hypotension: Yes Has patient had a PCN reaction causing severe rash involving mucus membranes or skin necrosis: No Has patient had a PCN reaction that required hospitalization No Has patient had a PCN reaction occurring within the last 10 years: No If all of the above answers are "NO", then may proceed with Cephalosporin use.   Percocet [Oxycodone-Acetaminophen] Nausea And Vomiting    Family History  Problem Relation Age of Onset   Heart disease Mother        deceased   Heart disease Father        deceased, heart disease   Diabetes Brother    Heart disease Brother    Thyroid disease Brother    Heart disease Sister    Heart disease Brother    Thyroid disease Brother    Lupus Daughter    Colon cancer Neg Hx     Prior to Admission medications   Medication Sig Start Date End Date Taking? Authorizing Provider  acetaminophen (TYLENOL) 325 MG tablet Take 650 mg by mouth every 6 (six) hours as needed for headache.    [provider]  amiodarone (PACERONE) 200 MG tablet Take 0.5 tablets (100 mg total) by mouth daily. 08/15/19   Camnitz, Ocie Doyne, MD  apixaban (ELIQUIS) 5 MG TABS tablet TAKE ONE TABLET BY MOUTH TWICE DAILY 04/06/22   Satira Sark, MD  atorvastatin (LIPITOR) 80 MG tablet TAKE ONE TABLET BY MOUTH EVERY DAY AT 6:00PM 10/06/18   Herminio Commons, MD  calcitRIOL (ROCALTROL) 0.25 MCG capsule Take 0.25 mcg by mouth 3 (three) times a week. 12/24/21   [provider]  carvedilol (COREG) 6.25 MG tablet Take 1 tablet (6.25 mg total) by mouth 2 (two) times daily. 04/14/22   Imogene Burn, PA-C  Cholecalciferol (VITAMIN D3 PO) Take 2,000 Units by mouth every evening.    [provider]  clopidogrel (PLAVIX) 75 MG tablet TAKE ONE TABLET ('75MG'$  TOTAL) BY MOUTH DAILY 03/20/19   Herminio Commons,  MD  Continuous Blood Gluc Receiver (FREESTYLE LIBRE 2 READER) DEVI 1 each by Does not apply route daily. 06/24/20   Philemon Kingdom, MD  Continuous Blood Gluc Sensor (FREESTYLE LIBRE 2 SENSOR) MISC USE ONE FOR FOURTEEN DAYS 03/30/22   Philemon Kingdom, MD  glucose blood (FREESTYLE PRECISION NEO TEST) test strip Use as instructed 05/23/21   Philemon Kingdom, MD  Insulin NPH, Human,, Isophane, (NOVOLIN N FLEXPEN) 100 UNIT/ML [place] INJECT 25 UNITS in [place] 15 UNITS BEFORE dinner 04/06/22   Philemon Kingdom, MD  insulin NPH-regular Human (NOVOLIN 70/30) (70-30) 100 UNIT/ML injection INJECT 25 UNITS BEFORE BREAKFAST AND 15 UNITS BEFORE SUPPER 02/03/22  Philemon Kingdom, MD  levothyroxine (SYNTHROID) 112 MCG tablet TAKE ONE TABLET BY MOUTH ONCE DAILY 04/06/22   Philemon Kingdom, MD  nitroGLYCERIN (NITROSTAT) 0.4 MG SL tablet Place 1 tablet (0.4 mg total) under the tongue every 5 (five) minutes as needed for chest pain. 11/25/17   Rolland Porter, MD  olmesartan (BENICAR) 5 MG tablet Take 5 mg by mouth in the morning and at bedtime. 02/27/22   [provider]  rOPINIRole (REQUIP) 0.5 MG tablet Take 0.5 mg by mouth at bedtime.  03/05/14   [provider]  Semaglutide, 2 MG/DOSE, 8 MG/3ML SOPN Inject 2 mg as directed once a week. 06/06/21   Philemon Kingdom, MD    Physical Exam: Vitals:   05/09/22 0528 05/09/22 0630 05/09/22 0805 05/09/22 0829  BP:  135/86 (!) 146/83 134/86  Pulse: 100 (!) 107 (!) 108 (!) 110  Resp: '17 20 18   '$ Temp:   98.1 F (36.7 C)   TempSrc:   Oral   SpO2: 98% 95% 99%   Weight:      Height:       GENERAL:  A&O x 3, NAD, well developed, cooperative, follows commands HEENT: [place]/AT, No thrush, No icterus, No oral ulcers Neck:  No neck mass, No meningismus, soft, supple CV: RRR, no S3, no S4, no rub, no JVD Lungs:  CTA, no wheeze, no rhonchi, good air movement Abd: soft/NT +BS, nondistended Ext: No edema, no lymphangitis, no cyanosis, no rashes Neuro:   CN II-XII intact, strength 4/5 in RUE, RLE, strength 4/5 LUE, LLE; sensation intact bilateral; no dysmetria; babinski equivocal  Data Reviewed: Data reviewed above in history  Assessment and Plan: * Angina at rest Cardiology consulted, Dr. Darleene Cleaver to [facility] -continue plavix -continue statin -continue coreg -trend troponins -Echo  Chronic heart failure with preserved ejection fraction (HFpEF) (HCC) Clinically euvolemic -continue coreg  Paroxysmal atrial fibrillation (HCC) Continue amiodarone Holding apixaban in preparation for procedure per cardiology request  Chronic renal failure, stage 3b ([place]) Baselin creatinine 1.1-1.4 Monitor serial BMP  Tachy-brady syndrome ([place]) S/p PPM 06/2016 Follows Dr. Curt Bears  Hypokalemia Replete IV and po Check mag  Type 2 diabetes with nephropathy ([place]) 02/09/22 A1C--6.0 Novolog sliding scale Semaglutide on Wednesdays  Hypothyroidism Continue levothyroxine  Essential hypertension Continue olmesartan and carvedilol  Mixed hyperlipidemia Continue atorvastatin      Advance Care Planning: FULL CODE  Consults: cardiology--[place]  Family Communication: spouse updated 11/18  Severity of Illness: The appropriate patient status for this patient is INPATIENT. Inpatient status is judged to be reasonable and necessary in order to provide the required intensity of service to ensure the patient's safety. The patient's presenting symptoms, physical exam findings, and initial radiographic and laboratory data in the context of their chronic comorbidities is felt to place them at high risk for further clinical deterioration. Furthermore, it is not anticipated that the patient will be medically stable for discharge from the hospital within 2 midnights of admission.   * I certify that at the point of admission it is my clinical judgment that the patient will require inpatient hospital care spanning beyond 2 midnights from the point of  admission due to high intensity of service, high risk for further deterioration and high frequency of surveillance required.*  Author: Orson Eva, MD 05/09/2022 9:19 AM  For on call review [email or web address].

## 2022-05-09 NOTE — Assessment & Plan Note (Signed)
Clinically euvolemic -continue coreg

## 2022-05-09 NOTE — Consult Note (Signed)
CARDIOLOGY CONSULT NOTE       Patient ID: Virginia Huber MRN: [id number] DOB/AGE: 77/30/1947 77 y.o.  Admit date: 05/09/2022 Referring Physician: Tat Primary Physician: Sharilyn Sites, MD Primary Cardiologist: [place]/  EP [facility] Reason for Consultation: Angina  Principal Problem:   Angina at rest Active Problems:   Mixed hyperlipidemia   Essential hypertension   Hypothyroidism   Type 2 diabetes with nephropathy ([place])   Hypokalemia   Tachy-brady syndrome (HCC)   Chronic renal failure, stage 3b (HCC)   Paroxysmal atrial fibrillation (HCC)   Chronic heart failure with preserved ejection fraction (HFpEF) (HCC)   HPI:  76 y.o. transferred [facility] with increasing angina. History of CAD with distant stent to proximal/mid LCX, occluded OM1 Last intervention to distal RCA done 2017 EF has been normal. Has history of PPM with lead revision complicated by need for pericardiocentesis in 2018.  Most recent PACEART shows < 1% AT/PAF. She is on eliquis. Amiodarone dose decreased by Dr Curt Bears due to increasing tremor Plan for ablation in ? August 25, 2022. Brother died last week and service was Monday Has had increasing angina responsive to nitro. ECG non acute with flat troponin's 19->20->24.  Telemetry called ST but appears to be atypical flutter.   ROS All other systems reviewed and negative except as noted above  Past Medical History:  Diagnosis Date  . (HFpEF) heart failure with preserved ejection fraction ([place])   . Advanced nonexudative age-related macular degeneration of left eye with subfoveal involvement 06/26/2020   Ongoing, accounts for acuity  . 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 Arthritis   . Atrial fibrillation and flutter ([place])    a. h/o PAF/flutter during admission in 2013 for PNA. b. PAF during adm for NSTEMI Aug 26, 2015, subsequent paroxysms since then.  . B12 deficiency anemia   . Branch retinal vein occlusion with macular edema of right eye 10/11/2019  .  Chronic renal failure, stage 3b ([place]) 09/23/2017  . Coronary artery disease 11/30/2014   a. remote MI. b. h/o PTCA with scoring balloon to OM1 11/2014. c. NSTEMI 03/2015 s/p DES to prox-mid Cx. d. NSTEMI Aug 26, 2015 s/p scoring balloon/PTCA/DES to dRCA with PAF during that admission  . Cutaneous lupus erythematosus   . Early stage nonexudative age-related macular degeneration of right eye 02/27/2020  . Essential hypertension   . GERD (gastroesophageal reflux disease)   . History of blood transfusion 1980's   2nd surgical procedures  . Hypercholesteremia   . Hypothyroidism   . Myocardial infarction ([place]) 02/2012  . NSTEMI (non-ST elevated myocardial infarction) ([place]) 04/02/2015  . OSA (obstructive sleep apnea) 05/13/2016  . Ovarian tumor   . PAD (peripheral artery disease) ([place])    a. s/p LE angio 2015; followed by Dr. Fletcher Anon - managed medically.  . Pericardial effusion    a. 06/2016 after ppm - s/p pericardiocentesis.  [name] Posterior vitreous detachment of right eye 10/11/2019  . Posterior vitreous detachment of right eye 10/11/2019  . Retinal microaneurysm of right eye 10/11/2019  . S/P pericardiocentesis 06/28/2016  . 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 Stable central retinal vein occlusion of left eye 05/13/2020  . Tachy-brady syndrome ([place])    a. s/p Medtronic PPM 06/2016, c/b lead perf/pericardial effusion.  [name] TIA (transient ischemic attack)   . Type 2 diabetes with nephropathy ([place]) 02/29/2012    Family History  Problem Relation Age of Onset  . Heart disease Mother        deceased  . Heart  disease Father        deceased, heart disease  . Diabetes Brother   . Heart disease Brother   . Thyroid disease Brother   . Heart disease Sister   . Heart disease Brother   . Thyroid disease Brother   . Lupus Daughter   . Colon cancer Neg Hx     Social History   Socioeconomic History  . Marital status: Married    Spouse name: Not on file  . Number of  children: Not on file  . Years of education: Not on file  . Highest education level: Not on file  Occupational History  . Occupation: Retired    Fish farm manager: RETIRED    Comment: Research officer, political party  Tobacco Use  . Smoking status: Never    Passive exposure: Never  . Smokeless tobacco: Never  . Tobacco comments:    Never smoked  Vaping Use  . Vaping Use: Never used  Substance and Sexual Activity  . Alcohol use: No    Alcohol/week: 0.0 standard drinks of alcohol  . Drug use: No  . Sexual activity: Never    Birth control/protection: Surgical    Comment: hyst  Other Topics Concern  . Not on file  Social History Narrative  . Not on file   Social Determinants of Health   Financial Resource Strain: Not on file  Food Insecurity: Not on file  Transportation Needs: Not on file  Physical Activity: Not on file  Stress: Not on file  Social Connections: Not on file  Intimate Partner Violence: Not on file    Past Surgical History:  Procedure Laterality Date  . ABDOMINAL AORTAGRAM N/A 01/03/2014   Procedure: ABDOMINAL [name];  Surgeon: Wellington Hampshire, MD;  Location: [facility] CATH LAB;  Service: Cardiovascular;  Laterality: N/A;  . ABDOMINAL HYSTERECTOMY  06/22/1970   "partial"  . APPENDECTOMY  02/20/1969  . CARDIAC CATHETERIZATION  06/22/2006   Tiny OM-2 with 90% narrowing. Med tx.  [name] CARDIAC CATHETERIZATION N/A 11/30/2014   Procedure: Left Heart Cath and Coronary Angiography;  Surgeon: Troy Sine, MD; LAD 20%, CFX 50%, OM1 95%, right PLB 30%, LV normal   . CARDIAC CATHETERIZATION N/A 11/30/2014   Procedure: Coronary Balloon Angioplasty;  Surgeon: Troy Sine, MD;  Angiosculpt scoring balloon and PTCA to the OM1 reducing stenosis from 95% to less than 10%  . CARDIAC CATHETERIZATION N/A 04/03/2015   Procedure: Left Heart Cath and Coronary Angiography;  Surgeon: Jolaine Artist, MD; dLAD 50%, CFX 90%, OM1 100%, PLA 15%, LVEDP 13    . CARDIAC CATHETERIZATION N/A 04/03/2015    Procedure: Coronary Stent Intervention;  Surgeon: Sherren Mocha, MD; 3.0x18 mm Xience DES to the CFX    . CARDIAC CATHETERIZATION N/A 08/02/2015   Procedure: Left Heart Cath and Coronary Angiography;  Surgeon: Troy Sine, MD;  Location: [facility] CV LAB;  Service: Cardiovascular;  Laterality: N/A;  . CARDIAC CATHETERIZATION N/A 08/02/2015   Procedure: Coronary Stent Intervention;  Surgeon: Troy Sine, MD;  Location: [facility] CV LAB;  Service: Cardiovascular;  Laterality: N/A;  . CARDIAC CATHETERIZATION N/A 06/25/2016   Procedure: Pericardiocentesis;  Surgeon: Will Meredith Leeds, MD;  Location: [facility] CV LAB;  Service: Cardiovascular;  Laterality: N/A;  . cardiac stents    . CARDIOVERSION N/A 12/15/2017   Procedure: CARDIOVERSION;  Surgeon: Acie Fredrickson Wonda Cheng, MD;  Location[facility] ENDOSCOPY;  Service: Cardiovascular;  Laterality: N/A;  . CHOLECYSTECTOMY OPEN  02/20/1989  . COLONOSCOPY  06/23/2003   Dr.  Rehman: pancolonic divericula, polyp, path unknown currently  . COLONOSCOPY  06/22/2010   Dr. Oneida Alar: Normal TI, scattered diverticula in entire colon, small internal hemorrhoids, normal colon biopsies. Colonoscopy in 5-10 years.   . COLONOSCOPY WITH PROPOFOL N/A 06/02/2021   pancolonic diverticulosis. Two 4-6 mm polyps in transverse colon. Sessile serrated and hyperplastic. 5 year surveillance if benefits outweigh the risks.  . COLOSTOMY  05/23/1979  . COLOSTOMY REVERSAL  11/21/1979  . EP IMPLANTABLE DEVICE N/A 06/25/2016   Procedure: Lead Revision/Repair;  Surgeon: Will Meredith Leeds, MD;  Location: [facility] CV LAB;  Service: Cardiovascular;  Laterality: N/A;  . EP IMPLANTABLE DEVICE N/A 06/25/2016   Procedure: Pacemaker Implant;  Surgeon: Will Meredith Leeds, MD;  Location: [place] [facility] [place] [facility];  Service: Cardiovascular;  Laterality: N/A;  . EXCISIONAL HEMORRHOIDECTOMY  02/20/1969  . EYE SURGERY Left 06/22/1998   "branch vein occlusion"  . EYE SURGERY Left ~ 2001    "smoothed out wrinkle"  . LEFT OOPHORECTOMY  05/23/1979   nicked bowel, peritonitis, colostomy; colostomy reversed 1981   . LOWER EXTREMITY ANGIOGRAM N/A 01/03/2014   Procedure: LOWER EXTREMITY ANGIOGRAM;  Surgeon: Wellington Hampshire, MD;  Location: [place] [facility] CATH LAB;  Service: Cardiovascular;  Laterality: N/A;  . Nuclear med stress test  10/21/2011   Small area of mild ischemia inferoapically.  [name] PARTIAL HYSTERECTOMY  02/20/1969   left ovaries, then ovaries removed later due tumors   . POLYPECTOMY  06/02/2021   Procedure: POLYPECTOMY;  Surgeon: Eloise Harman, DO;  [facility];  Service: Endoscopy;;  . right eye surgery  03/2021  . RIGHT OOPHORECTOMY  02/20/1969      Current Facility-Administered Medications:  .  acetaminophen (TYLENOL) tablet 650 mg, 650 mg, Oral, Q6H PRN **OR** acetaminophen (TYLENOL) suppository 650 mg, 650 mg, Rectal, Q6H PRN, Tat, David, MD .  amiodarone (PACERONE) tablet 100 mg, 100 mg, Oral, Daily, Milton Ferguson, MD, 100 mg at 05/09/22 0921 .  [START ON 05/10/2022] amiodarone (PACERONE) tablet 100 mg, 100 mg, Oral, Daily, Tat, David, MD .  atorvastatin (LIPITOR) tablet 80 mg, 80 mg, Oral, Daily, Tat, David, MD, 80 mg at 05/09/22 1620 .  [START ON 05/11/2022] calcitRIOL (ROCALTROL) capsule 0.25 mcg, 0.25 mcg, Oral, Once per day on Mon Wed Fri, Tat, David, MD .  carvedilol (COREG) tablet 6.25 mg, 6.25 mg, Oral, BID, Tat, David, MD, 6.25 mg at 05/09/22 1620 .  cholecalciferol (VITAMIN D3) 25 MCG (1000 UNIT) tablet 2,000 Units, 2,000 Units, Oral, QPM, Orson Eva, MD, 2,000 Units at 05/09/22 1620 .  clopidogrel (PLAVIX) tablet 75 mg, 75 mg, Oral, Daily, Milton Ferguson, MD, 75 mg at 05/09/22 0920 .  heparin injection 5,000 Units, 5,000 Units, Subcutaneous, Q8H, Tat, David, MD .  irbesartan (AVAPRO) tablet 37.5 mg, 37.5 mg, Oral, Daily, Tat, David, MD, 37.5 mg at 05/09/22 1620 .  [START ON 05/10/2022] levothyroxine (SYNTHROID) tablet 112 mcg, 112 mcg, Oral, QAC  breakfast, Tat, David, MD .  ondansetron (ZOFRAN) tablet 4 mg, 4 mg, Oral, Q6H PRN **OR** ondansetron (ZOFRAN) injection 4 mg, 4 mg, Intravenous, Q6H PRN, Tat, David, MD .  rOPINIRole (REQUIP) tablet 0.5 mg, 0.5 mg, Oral, QHS, Tat, David, MD . amiodarone  100 mg Oral Daily  . [START ON 05/10/2022] amiodarone  100 mg Oral Daily  . atorvastatin  80 mg Oral Daily  . [START ON 05/11/2022] calcitRIOL  0.25 mcg Oral Once per day on Mon Wed Fri  . carvedilol  6.25 mg Oral BID  . cholecalciferol  2,000 Units Oral QPM  . clopidogrel  75 mg Oral Daily  . heparin  5,000 Units Subcutaneous Q8H  . irbesartan  37.5 mg Oral Daily  . [START ON 05/10/2022] levothyroxine  112 mcg Oral QAC breakfast  . rOPINIRole  0.5 mg Oral QHS     Physical Exam: Blood pressure (!) 167/84, pulse (!) 101, temperature 97.9 F (36.6 C), temperature source Oral, resp. rate 18, height '5\' 3"'$  (1.6 m), weight 61 kg, SpO2 98 %.   Elderly female PPM under left clavicle JVP normal No murmur  Abdomen benign No edema Palpable pedal pulses   Labs:   Lab Results  Component Value Date   WBC 8.3 05/09/2022   HGB 13.4 05/09/2022   HCT 41.9 05/09/2022   MCV 95.0 05/09/2022   PLT 238 05/09/2022    Recent Labs  Lab 05/09/22 0534  NA 142  K 2.9*  CL 113*  CO2 24  BUN 14  CREATININE 1.09*  CALCIUM 8.0*  GLUCOSE 114*   Lab Results  Component Value Date   TROPONINI <0.03 08/24/2018    Lab Results  Component Value Date   CHOL 166 08/05/2017   CHOL 116 (L) 04/14/2016   CHOL 97 (L) 10/02/2015   Lab Results  Component Value Date   HDL 43 (L) 08/05/2017   HDL 24 (L) 04/14/2016   HDL 36 (L) 10/02/2015   Lab Results  Component Value Date   LDLCALC 96 08/05/2017   LDLCALC NOT CALC 04/14/2016   LDLCALC 38 10/02/2015   Lab Results  Component Value Date   TRIG 160 (H) 08/05/2017   TRIG 439 (H) 04/14/2016   TRIG 117 10/02/2015   Lab Results  Component Value Date   CHOLHDL 3.9 08/05/2017   CHOLHDL 4.8  04/14/2016   CHOLHDL 2.7 10/02/2015   No results found for: "LDLDIRECT"    Radiology: DG Chest Portable 1 View  Result Date: 05/09/2022 CLINICAL DATA:  Chest pain starting at 4 a.m. EXAM: PORTABLE CHEST 1 VIEW COMPARISON:  05/07/2022 FINDINGS: Normal heart size and mediastinal contours. Dual-chamber pacer leads from the left. There is no edema, consolidation, effusion, or pneumothorax. IMPRESSION: No acute finding, stable from prior. Electronically Signed   By: Jorje Guild M.D.   On: 05/09/2022 05:57   DG Ribs Unilateral W/Chest Left  Result Date: 05/07/2022 CLINICAL DATA:  Recent fall, left chest pain EXAM: LEFT RIBS AND CHEST - 3+ VIEW COMPARISON:  10/24/2019 FINDINGS: Cardiac size is within normal limits. Pacemaker battery is seen in left infraclavicular region. Surgical clips are seen in right upper quadrant. There are no signs of pulmonary edema or focal pulmonary consolidation. There is interval clearing of pulmonary vascular congestion and pulmonary edema. There is no pleural effusion or pneumothorax. No displaced fractures are seen in left ribs. IMPRESSION: No displaced fractures are seen in left ribs. There are no signs of pulmonary edema or focal pulmonary consolidation. There is no pleural effusion or pneumothorax. Electronically Signed   By: Elmer Picker M.D.   On: 05/07/2022 15:13    EKG: ? Flutter IVCD LVH LAD    ASSESSMENT AND PLAN:   Angina:  increasing last 2 weeks ? Related to faster flutter rate. Transitionto heparin Cath Monday History of distal RCA and LCX stent.  Shared Decision Making/Informed Consent The risks [stroke (1 in 1000), death (1 in 1000), kidney failure [usually temporary] (1 in 500), bleeding (1 in 200), allergic reaction [possibly serious] (1 in 200)], benefits (diagnostic support and management of coronary  artery disease) and alternatives of a cardiac catheterization were discussed in detail with Ms. Jeanell Sparrow and she is willing to proceed.  2.   PPM:  interrogate in am with EP October PACEART with <1% PAF/AT but patient has noticed more palpitations and rapid rates for 2 weeks 3. PAF:  heparin EP to see in am. ? Start tikosyn while in hospital as her amiodarone is causing tremors and ablation not scheduled until February Check Mg/K supplement Increase coreg to 12.5 mg bid  Signed: Jenkins Rouge 05/09/2022, 4:21 PM

## 2022-05-09 NOTE — Assessment & Plan Note (Signed)
02/09/22 A1C--6.0 Novolog sliding scale Semaglutide on Wednesdays

## 2022-05-09 NOTE — Assessment & Plan Note (Signed)
Replete IV and po Check mag

## 2022-05-09 NOTE — Assessment & Plan Note (Signed)
Continue levothyroxine 

## 2022-05-09 NOTE — H&P (View-Only) (Signed)
CARDIOLOGY CONSULT NOTE       Patient ID: Virginia Huber MRN: [id number] DOB/AGE: 1945/10/22 76 y.o.  Admit date: 05/09/2022 Referring Physician: Tat Primary Physician: Sharilyn Sites, MD Primary Cardiologist: [place]/  EP [facility] Reason for Consultation: Angina  Principal Problem:   Angina at rest Active Problems:   Mixed hyperlipidemia   Essential hypertension   Hypothyroidism   Type 2 diabetes with nephropathy ([place])   Hypokalemia   Tachy-brady syndrome (HCC)   Chronic renal failure, stage 3b (HCC)   Paroxysmal atrial fibrillation (HCC)   Chronic heart failure with preserved ejection fraction (HFpEF) (HCC)   HPI:  76 y.o. transferred [facility] with increasing angina. History of CAD with distant stent to proximal/mid LCX, occluded OM1 Last intervention to distal RCA done 2017 EF has been normal. Has history of PPM with lead revision complicated by need for pericardiocentesis in 2018.  Most recent PACEART shows < 1% AT/PAF. She is on eliquis. Amiodarone dose decreased by Dr Curt Bears due to increasing tremor Plan for ablation in ? 08/29/2022. Brother died last week and service was Monday Has had increasing angina responsive to nitro. ECG non acute with flat troponin's 19->20->24.  Telemetry called ST but appears to be atypical flutter.   ROS All other systems reviewed and negative except as noted above  Past Medical History:  Diagnosis Date  . (HFpEF) heart failure with preserved ejection fraction ([place])   . Advanced nonexudative age-related macular degeneration of left eye with subfoveal involvement 06/26/2020   Ongoing, accounts for acuity  . 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 Arthritis   . Atrial fibrillation and flutter ([place])    a. h/o PAF/flutter during admission in 2013 for PNA. b. PAF during adm for NSTEMI 08-30-2015, subsequent paroxysms since then.  . B12 deficiency anemia   . Branch retinal vein occlusion with macular edema of right eye 10/11/2019  .  Chronic renal failure, stage 3b ([place]) 09/23/2017  . Coronary artery disease 11/30/2014   a. remote MI. b. h/o PTCA with scoring balloon to OM1 11/2014. c. NSTEMI 03/2015 s/p DES to prox-mid Cx. d. NSTEMI 08-30-15 s/p scoring balloon/PTCA/DES to dRCA with PAF during that admission  . Cutaneous lupus erythematosus   . Early stage nonexudative age-related macular degeneration of right eye 02/27/2020  . Essential hypertension   . GERD (gastroesophageal reflux disease)   . History of blood transfusion 1980's   2nd surgical procedures  . Hypercholesteremia   . Hypothyroidism   . Myocardial infarction ([place]) 02/2012  . NSTEMI (non-ST elevated myocardial infarction) ([place]) 04/02/2015  . OSA (obstructive sleep apnea) 05/13/2016  . Ovarian tumor   . PAD (peripheral artery disease) ([place])    a. s/p LE angio 2015; followed by Dr. Fletcher Anon - managed medically.  . Pericardial effusion    a. 06/2016 after ppm - s/p pericardiocentesis.  [name] Posterior vitreous detachment of right eye 10/11/2019  . Posterior vitreous detachment of right eye 10/11/2019  . Retinal microaneurysm of right eye 10/11/2019  . S/P pericardiocentesis 06/28/2016  . 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 Stable central retinal vein occlusion of left eye 05/13/2020  . Tachy-brady syndrome ([place])    a. s/p [employer] PPM 06/2016, c/b lead perf/pericardial effusion.  [name] TIA (transient ischemic attack)   . Type 2 diabetes with nephropathy ([place]) 02/29/2012    Family History  Problem Relation Age of Onset  . Heart disease Mother        deceased  . Heart  disease Father        deceased, heart disease  . Diabetes Brother   . Heart disease Brother   . Thyroid disease Brother   . Heart disease Sister   . Heart disease Brother   . Thyroid disease Brother   . Lupus Daughter   . Colon cancer Neg Hx     Social History   Socioeconomic History  . Marital status: Married    Spouse name: Not on file  . Number of  children: Not on file  . Years of education: Not on file  . Highest education level: Not on file  Occupational History  . Occupation: Retired    Fish farm manager: RETIRED    Comment: Research officer, political party  Tobacco Use  . Smoking status: Never    Passive exposure: Never  . Smokeless tobacco: Never  . Tobacco comments:    Never smoked  Vaping Use  . Vaping Use: Never used  Substance and Sexual Activity  . Alcohol use: No    Alcohol/week: 0.0 standard drinks of alcohol  . Drug use: No  . Sexual activity: Never    Birth control/protection: Surgical    Comment: hyst  Other Topics Concern  . Not on file  Social History Narrative  . Not on file   Social Determinants of Health   Financial Resource Strain: Not on file  Food Insecurity: Not on file  Transportation Needs: Not on file  Physical Activity: Not on file  Stress: Not on file  Social Connections: Not on file  Intimate Partner Violence: Not on file    Past Surgical History:  Procedure Laterality Date  . ABDOMINAL AORTAGRAM N/A 01/03/2014   Procedure: ABDOMINAL [name];  Surgeon: Wellington Hampshire, MD;  Location: [place] CATH LAB;  Service: Cardiovascular;  Laterality: N/A;  . ABDOMINAL HYSTERECTOMY  06/22/1970   "partial"  . APPENDECTOMY  02/20/1969  . CARDIAC CATHETERIZATION  06/22/2006   Tiny OM-2 with 90% narrowing. Med tx.  [name] CARDIAC CATHETERIZATION N/A 11/30/2014   Procedure: Left Heart Cath and Coronary Angiography;  Surgeon: Troy Sine, MD; LAD 20%, CFX 50%, OM1 95%, right PLB 30%, LV normal   . CARDIAC CATHETERIZATION N/A 11/30/2014   Procedure: Coronary Balloon Angioplasty;  Surgeon: Troy Sine, MD;  Angiosculpt scoring balloon and PTCA to the OM1 reducing stenosis from 95% to less than 10%  . CARDIAC CATHETERIZATION N/A 04/03/2015   Procedure: Left Heart Cath and Coronary Angiography;  Surgeon: Jolaine Artist, MD; dLAD 50%, CFX 90%, OM1 100%, PLA 15%, LVEDP 13    . CARDIAC CATHETERIZATION N/A 04/03/2015    Procedure: Coronary Stent Intervention;  Surgeon: Sherren Mocha, MD; 3.0x18 mm Xience DES to the CFX    . CARDIAC CATHETERIZATION N/A 08/02/2015   Procedure: Left Heart Cath and Coronary Angiography;  Surgeon: Troy Sine, MD;  Location: [facility] [place] CV LAB;  Service: Cardiovascular;  Laterality: N/A;  . CARDIAC CATHETERIZATION N/A 08/02/2015   Procedure: Coronary Stent Intervention;  Surgeon: Troy Sine, MD;  Location: [place] CV LAB;  Service: Cardiovascular;  Laterality: N/A;  . CARDIAC CATHETERIZATION N/A 06/25/2016   Procedure: Pericardiocentesis;  Surgeon: Will Meredith Leeds, MD;  Location: [place] CV LAB;  Service: Cardiovascular;  Laterality: N/A;  . cardiac stents    . CARDIOVERSION N/A 12/15/2017   Procedure: CARDIOVERSION;  Surgeon: Acie Fredrickson Wonda Cheng, MD;  Location[facility] ENDOSCOPY;  Service: Cardiovascular;  Laterality: N/A;  . CHOLECYSTECTOMY OPEN  02/20/1989  . COLONOSCOPY  06/23/2003   Dr.  Rehman: pancolonic divericula, polyp, path unknown currently  . COLONOSCOPY  06/22/2010   Dr. Oneida Alar: Normal TI, scattered diverticula in entire colon, small internal hemorrhoids, normal colon biopsies. Colonoscopy in 5-10 years.   . COLONOSCOPY WITH PROPOFOL N/A 06/02/2021   pancolonic diverticulosis. Two 4-6 mm polyps in transverse colon. Sessile serrated and hyperplastic. 5 year surveillance if benefits outweigh the risks.  . COLOSTOMY  05/23/1979  . COLOSTOMY REVERSAL  11/21/1979  . EP IMPLANTABLE DEVICE N/A 06/25/2016   Procedure: Lead Revision/Repair;  Surgeon: Will Meredith Leeds, MD;  Location: [place] CV LAB;  Service: Cardiovascular;  Laterality: N/A;  . EP IMPLANTABLE DEVICE N/A 06/25/2016   Procedure: Pacemaker Implant;  Surgeon: Will Meredith Leeds, MD;  Location: [place] CV LAB;  Service: Cardiovascular;  Laterality: N/A;  . EXCISIONAL HEMORRHOIDECTOMY  02/20/1969  . EYE SURGERY Left 06/22/1998   "branch vein occlusion"  . EYE SURGERY Left ~ 2001    "smoothed out wrinkle"  . LEFT OOPHORECTOMY  05/23/1979   nicked bowel, peritonitis, colostomy; colostomy reversed 1981   . LOWER EXTREMITY ANGIOGRAM N/A 01/03/2014   Procedure: LOWER EXTREMITY ANGIOGRAM;  Surgeon: Wellington Hampshire, MD;  Location: [place] CATH LAB;  Service: Cardiovascular;  Laterality: N/A;  . Nuclear med stress test  10/21/2011   Small area of mild ischemia inferoapically.  [name] PARTIAL HYSTERECTOMY  02/20/1969   left ovaries, then ovaries removed later due tumors   . POLYPECTOMY  06/02/2021   Procedure: POLYPECTOMY;  Surgeon: Eloise Harman, DO;  [facility];  Service: Endoscopy;;  . right eye surgery  03/2021  . RIGHT OOPHORECTOMY  02/20/1969      Current Facility-Administered Medications:  .  acetaminophen (TYLENOL) tablet 650 mg, 650 mg, Oral, Q6H PRN **OR** acetaminophen (TYLENOL) suppository 650 mg, 650 mg, Rectal, Q6H PRN, Tat, David, MD .  amiodarone (PACERONE) tablet 100 mg, 100 mg, Oral, Daily, Milton Ferguson, MD, 100 mg at 05/09/22 0921 .  [START ON 05/10/2022] amiodarone (PACERONE) tablet 100 mg, 100 mg, Oral, Daily, Tat, David, MD .  atorvastatin (LIPITOR) tablet 80 mg, 80 mg, Oral, Daily, Tat, David, MD, 80 mg at 05/09/22 1620 .  [START ON 05/11/2022] calcitRIOL (ROCALTROL) capsule 0.25 mcg, 0.25 mcg, Oral, Once per day on Mon Wed Fri, Tat, David, MD .  carvedilol (COREG) tablet 6.25 mg, 6.25 mg, Oral, BID, Tat, David, MD, 6.25 mg at 05/09/22 1620 .  cholecalciferol (VITAMIN D3) 25 MCG (1000 UNIT) tablet 2,000 Units, 2,000 Units, Oral, QPM, Orson Eva, MD, 2,000 Units at 05/09/22 1620 .  clopidogrel (PLAVIX) tablet 75 mg, 75 mg, Oral, Daily, Milton Ferguson, MD, 75 mg at 05/09/22 0920 .  heparin injection 5,000 Units, 5,000 Units, Subcutaneous, Q8H, Tat, David, MD .  irbesartan (AVAPRO) tablet 37.5 mg, 37.5 mg, Oral, Daily, Tat, David, MD, 37.5 mg at 05/09/22 1620 .  [START ON 05/10/2022] levothyroxine (SYNTHROID) tablet 112 mcg, 112 mcg, Oral, QAC  breakfast, Tat, David, MD .  ondansetron (ZOFRAN) tablet 4 mg, 4 mg, Oral, Q6H PRN **OR** ondansetron (ZOFRAN) injection 4 mg, 4 mg, Intravenous, Q6H PRN, Tat, David, MD .  rOPINIRole (REQUIP) tablet 0.5 mg, 0.5 mg, Oral, QHS, Tat, David, MD . amiodarone  100 mg Oral Daily  . [START ON 05/10/2022] amiodarone  100 mg Oral Daily  . atorvastatin  80 mg Oral Daily  . [START ON 05/11/2022] calcitRIOL  0.25 mcg Oral Once per day on Mon Wed Fri  . carvedilol  6.25 mg Oral BID  . cholecalciferol  2,000 Units Oral QPM  . clopidogrel  75 mg Oral Daily  . heparin  5,000 Units Subcutaneous Q8H  . irbesartan  37.5 mg Oral Daily  . [START ON 05/10/2022] levothyroxine  112 mcg Oral QAC breakfast  . rOPINIRole  0.5 mg Oral QHS     Physical Exam: Blood pressure (!) 167/84, pulse (!) 101, temperature 97.9 F (36.6 C), temperature source Oral, resp. rate 18, height '5\' 3"'$  (1.6 m), weight 61 kg, SpO2 98 %.   Elderly female PPM under left clavicle JVP normal No murmur  Abdomen benign No edema Palpable pedal pulses   Labs:   Lab Results  Component Value Date   WBC 8.3 05/09/2022   HGB 13.4 05/09/2022   HCT 41.9 05/09/2022   MCV 95.0 05/09/2022   PLT 238 05/09/2022    Recent Labs  Lab 05/09/22 0534  NA 142  K 2.9*  CL 113*  CO2 24  BUN 14  CREATININE 1.09*  CALCIUM 8.0*  GLUCOSE 114*   Lab Results  Component Value Date   TROPONINI <0.03 08/24/2018    Lab Results  Component Value Date   CHOL 166 08/05/2017   CHOL 116 (L) 04/14/2016   CHOL 97 (L) 10/02/2015   Lab Results  Component Value Date   HDL 43 (L) 08/05/2017   HDL 24 (L) 04/14/2016   HDL 36 (L) 10/02/2015   Lab Results  Component Value Date   LDLCALC 96 08/05/2017   LDLCALC NOT CALC 04/14/2016   LDLCALC 38 10/02/2015   Lab Results  Component Value Date   TRIG 160 (H) 08/05/2017   TRIG 439 (H) 04/14/2016   TRIG 117 10/02/2015   Lab Results  Component Value Date   CHOLHDL 3.9 08/05/2017   CHOLHDL 4.8  04/14/2016   CHOLHDL 2.7 10/02/2015   No results found for: "LDLDIRECT"    Radiology: DG Chest Portable 1 View  Result Date: 05/09/2022 CLINICAL DATA:  Chest pain starting at 4 a.m. EXAM: PORTABLE CHEST 1 VIEW COMPARISON:  05/07/2022 FINDINGS: Normal heart size and mediastinal contours. Dual-chamber pacer leads from the left. There is no edema, consolidation, effusion, or pneumothorax. IMPRESSION: No acute finding, stable from prior. Electronically Signed   By: Jorje Guild M.D.   On: 05/09/2022 05:57   DG Ribs Unilateral W/Chest Left  Result Date: 05/07/2022 CLINICAL DATA:  Recent fall, left chest pain EXAM: LEFT RIBS AND CHEST - 3+ VIEW COMPARISON:  10/24/2019 FINDINGS: Cardiac size is within normal limits. Pacemaker battery is seen in left infraclavicular region. Surgical clips are seen in right upper quadrant. There are no signs of pulmonary edema or focal pulmonary consolidation. There is interval clearing of pulmonary vascular congestion and pulmonary edema. There is no pleural effusion or pneumothorax. No displaced fractures are seen in left ribs. IMPRESSION: No displaced fractures are seen in left ribs. There are no signs of pulmonary edema or focal pulmonary consolidation. There is no pleural effusion or pneumothorax. Electronically Signed   By: Elmer Picker M.D.   On: 05/07/2022 15:13    EKG: ? Flutter IVCD LVH LAD    ASSESSMENT AND PLAN:   Angina:  increasing last 2 weeks ? Related to faster flutter rate. Transitionto heparin Cath Monday History of distal RCA and LCX stent.  Shared Decision Making/Informed Consent The risks [stroke (1 in 1000), death (1 in 1000), kidney failure [usually temporary] (1 in 500), bleeding (1 in 200), allergic reaction [possibly serious] (1 in 200)], benefits (diagnostic support and management of coronary  artery disease) and alternatives of a cardiac catheterization were discussed in detail with Virginia Huber and she is willing to proceed.  2.   PPM:  interrogate in am with EP October PACEART with <1% PAF/AT but patient has noticed more palpitations and rapid rates for 2 weeks 3. PAF:  heparin EP to see in am. ? Start tikosyn while in hospital as her amiodarone is causing tremors and ablation not scheduled until February Check Mg/K supplement Increase coreg to 12.5 mg bid  Signed: Jenkins Rouge 05/09/2022, 4:21 PM

## 2022-05-09 NOTE — Assessment & Plan Note (Signed)
Continue atorvastatin

## 2022-05-09 NOTE — ED Provider Notes (Signed)
[facility] Emergency Department Provider Note MRN:  [id number]  Arrival date & time: 05/09/22     Chief Complaint   Chest Pain   History of Present Illness   Virginia Huber is a 76 y.o. year-old female with a history of CHF, CAD presenting to the ED with chief complaint of chest pain.  Woke up from sleep with central chest pain, diaphoresis.  Pain improved over the past 30 minutes.  Mild shortness of breath, no leg pain or swelling, no lower abdominal pain.  Review of Systems  A thorough review of systems was obtained and all systems are negative except as noted in the HPI and PMH.   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 h/o PAF/flutter during admission in 2013 for PNA. b. PAF during adm for NSTEMI 07/2015, subsequent paroxysms since then.   B12 deficiency anemia    Branch retinal vein occlusion with macular edema of right eye 10/11/2019   Chronic renal failure, stage 3b ([place]) 09/23/2017   Coronary artery disease 11/30/2014   a. remote MI. b. h/o PTCA with scoring balloon to OM1 11/2014. c. NSTEMI 03/2015 s/p DES to prox-mid Cx. d. NSTEMI 07/2015 s/p scoring balloon/PTCA/DES to dRCA with PAF during that admission   Cutaneous lupus erythematosus    Early stage nonexudative age-related macular degeneration of right eye 02/27/2020   Essential hypertension    GERD (gastroesophageal reflux disease)    History of blood transfusion 1980's   2nd surgical procedures   Hypercholesteremia    Hypothyroidism    Myocardial infarction ([place]) 02/2012   NSTEMI (non-ST elevated myocardial infarction) ([place]) 04/02/2015   OSA (obstructive  sleep apnea) 05/13/2016   Ovarian tumor    PAD (peripheral artery disease) ([place])    a. s/p LE angio 2015; followed by Dr. Fletcher Anon - managed medically.   Pericardial effusion    a. 06/2016 after ppm - s/p pericardiocentesis.   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 s/p Medtronic PPM 06/2016, c/b lead perf/pericardial effusion.   TIA (transient ischemic attack)    Type 2 diabetes with nephropathy ([place]) 02/29/2012    Past Surgical History:  Procedure Laterality Date   ABDOMINAL AORTAGRAM N/A 01/03/2014   Procedure: ABDOMINAL [name];  Surgeon: Wellington Hampshire, MD;  Location: [place] CATH LAB;  Service: Cardiovascular;  Laterality: N/A;   ABDOMINAL HYSTERECTOMY  06/22/1970   "partial"   APPENDECTOMY  02/20/1969   CARDIAC CATHETERIZATION  06/22/2006   Tiny OM-2 with 90% narrowing. Med tx.   CARDIAC CATHETERIZATION N/A 11/30/2014   Procedure: Left Heart Cath and Coronary Angiography;  Surgeon: Troy Sine, MD; LAD 20%, CFX 50%, OM1 95%, right PLB 30%, LV normal    CARDIAC CATHETERIZATION N/A 11/30/2014   Procedure: Coronary Balloon Angioplasty;  Surgeon: Troy Sine, MD;  Angiosculpt scoring balloon and PTCA to the OM1 reducing  stenosis from 95% to less than 10%   CARDIAC CATHETERIZATION N/A 04/03/2015   Procedure: Left Heart Cath and Coronary Angiography;  Surgeon: Jolaine Artist, MD; dLAD 50%, CFX 90%, OM1 100%, PLA 15%, LVEDP 13     CARDIAC CATHETERIZATION N/A 04/03/2015   Procedure: Coronary Stent Intervention;  Surgeon: Sherren Mocha, MD; 3.0x18 mm Xience DES to the CFX     CARDIAC CATHETERIZATION N/A 08/02/2015   Procedure: Left Heart Cath and Coronary  Angiography;  Surgeon: Troy Sine, MD;  Location: [place] CV LAB;  Service: Cardiovascular;  Laterality: N/A;   CARDIAC CATHETERIZATION N/A 08/02/2015   Procedure: Coronary Stent Intervention;  Surgeon: Troy Sine, MD;  Location: [place] CV LAB;  Service: Cardiovascular;  Laterality: N/A;   CARDIAC CATHETERIZATION N/A 06/25/2016   Procedure: Pericardiocentesis;  Surgeon: Will Meredith Leeds, MD;  Location: [place] CV LAB;  Service: Cardiovascular;  Laterality: N/A;   cardiac stents     CARDIOVERSION N/A 12/15/2017   Procedure: CARDIOVERSION;  Surgeon: Acie Fredrickson Wonda Cheng, MD;  Location: [facility] ENDOSCOPY;  Service: Cardiovascular;  Laterality: N/A;   CHOLECYSTECTOMY OPEN  02/20/1989   COLONOSCOPY  06/23/2003   Dr. Laural Golden: pancolonic divericula, polyp, path unknown currently   COLONOSCOPY  06/22/2010   Dr. Oneida Alar: Normal TI, scattered diverticula in entire colon, small internal hemorrhoids, normal colon biopsies. Colonoscopy in 5-10 years.    COLONOSCOPY WITH PROPOFOL N/A 06/02/2021   pancolonic diverticulosis. Two 4-6 mm polyps in transverse colon. Sessile serrated and hyperplastic. 5 year surveillance if benefits outweigh the risks.   COLOSTOMY  05/23/1979   COLOSTOMY REVERSAL  11/21/1979   EP IMPLANTABLE DEVICE N/A 06/25/2016   Procedure: Lead Revision/Repair;  Surgeon: Will Meredith Leeds, MD;  Location: [place] CV LAB;  Service: Cardiovascular;  Laterality: N/A;   EP IMPLANTABLE DEVICE N/A 06/25/2016   Procedure: Pacemaker Implant;  Surgeon: Will Meredith Leeds, MD;  Location: [place] CV LAB;  Service: Cardiovascular;  Laterality: N/A;   EXCISIONAL HEMORRHOIDECTOMY  02/20/1969   EYE SURGERY Left 06/22/1998   "branch vein occlusion"   EYE SURGERY Left ~ 2001   "smoothed out wrinkle"   LEFT OOPHORECTOMY  05/23/1979   nicked bowel, peritonitis, colostomy; colostomy reversed 1981    LOWER EXTREMITY ANGIOGRAM N/A 01/03/2014   Procedure: LOWER EXTREMITY ANGIOGRAM;   Surgeon: Wellington Hampshire, MD;  Location: [place] CATH LAB;  Service: Cardiovascular;  Laterality: N/A;   Nuclear med stress test  10/21/2011   Small area of mild ischemia inferoapically.   PARTIAL HYSTERECTOMY  02/20/1969   left ovaries, then ovaries removed later due tumors    POLYPECTOMY  06/02/2021   Procedure: POLYPECTOMY;  Surgeon: Eloise Harman, DO;  [facility];  Service: Endoscopy;;   right eye surgery  03/2021   RIGHT OOPHORECTOMY  02/20/1969    Family History  Problem Relation Age of Onset   Heart disease Mother        deceased   Heart disease Father        deceased, heart disease   Diabetes Brother    Heart disease Brother    Thyroid disease Brother    Heart disease Sister    Heart disease Brother    Thyroid disease Brother    Lupus Daughter    Colon cancer Neg Hx     Social History   Socioeconomic History   Marital status: Married    Spouse name: Not on file   Number of children: Not on file  Years of education: Not on file   Highest education level: Not on file  Occupational History   Occupation: Retired    Fish farm manager: RETIRED    Comment: insurance billing  Tobacco Use   Smoking status: Never    Passive exposure: Never   Smokeless tobacco: Never   Tobacco comments:    Never smoked  Vaping Use   Vaping Use: Never used  Substance and Sexual Activity   Alcohol use: No    Alcohol/week: 0.0 standard drinks of alcohol   Drug use: No   Sexual activity: Never    Birth control/protection: Surgical    Comment: hyst  Other Topics Concern   Not on file  Social History Narrative   Not on file   Social Determinants of Health   Financial Resource Strain: Not on file  Food Insecurity: Not on file  Transportation Needs: Not on file  Physical Activity: Not on file  Stress: Not on file  Social Connections: Not on file  Intimate Partner Violence: Not on file     Physical Exam   Vitals:   05/09/22 0510 05/09/22 0528  Pulse:  100  Resp: 18 17   Temp: 97.8 F (36.6 C)   SpO2:  98%    CONSTITUTIONAL: Well-appearing, NAD NEURO/PSYCH:  Alert and oriented x 3, no focal deficits EYES:  eyes equal and reactive ENT/NECK:  no LAD, no JVD CARDIO: Regular rate, well-perfused, normal S1 and S2 PULM:  CTAB no wheezing or rhonchi GI/GU:  non-distended, non-tender MSK/SPINE:  No gross deformities, no edema SKIN:  no rash, atraumatic   *Additional and/or pertinent findings included in MDM below  Diagnostic and Interventional Summary    EKG Interpretation  Date/Time:  Saturday May 09 2022 06:16:13 EST Ventricular Rate:  105 PR Interval:  44 QRS Duration: 140 QT Interval:  430 QTC Calculation: 569 R Axis:   -87 Text Interpretation: Sinus tachycardia Nonspecific IVCD with LAD LVH with secondary repolarization abnormality Probable lateral infarct, age indeterminate Anterior Q waves, possibly due to LVH Confirmed by Gerlene Fee [phone]) on 05/09/2022 6:21:26 AM       Labs Reviewed  BASIC METABOLIC PANEL - Abnormal; Notable for the following components:      Result Value   Potassium 2.9 (*)    Chloride 113 (*)    Glucose, Bld 114 (*)    Creatinine, Ser 1.09 (*)    Calcium 8.0 (*)    GFR, Estimated 53 (*)    All other components within normal limits  TROPONIN I (HIGH SENSITIVITY) - Abnormal; Notable for the following components:   Troponin I (High Sensitivity) 20 (*)    All other components within normal limits  CBC    DG Chest Portable 1 View  Final Result      Medications - No data to display   Procedures  /  Critical Care Procedures  ED Course and Medical Decision Making  Initial Impression and Ddx Differential diagnosis includes ACS.  Doubt PE.  Dissection felt to be unlikely.  Awaiting labs, troponin.  Past medical/surgical history that increases complexity of ED encounter: CAD with stents, pacemaker  Interpretation of Diagnostics I personally reviewed the EKG and my interpretation is as follows: Subtle  ST changes  Labs reveal minimally elevated troponin, otherwise no significant blood count or electrolyte disturbance  Patient Reassessment and Ultimate Disposition/Management     Awaiting second troponin, anticipating admission to either hospitalist or cardiology.  Patient management required discussion with the following services or consulting groups:  None  Complexity of Problems Addressed Acute illness or injury that poses threat of life of bodily function  Additional Data Reviewed and Analyzed Further history obtained from: Further history from spouse/family member  Additional Factors Impacting ED Encounter Risk Consideration of hospitalization  Barth Kirks. [place] Small, [place] [email or web address]  Final Clinical Impressions(s) / ED Diagnoses     ICD-10-CM   1. Chest pain, unspecified type  R07.9       ED Discharge Orders     None        Discharge Instructions Discussed with and Provided to Patient:   Discharge Instructions   None      Maudie Flakes, MD 05/09/22 [phone]

## 2022-05-09 NOTE — ED Provider Notes (Signed)
Patient has a history of diabetes, coronary artery disease, TIAs and hypothyroidism.  She awoke this morning with severe chest pain and shortness of breath.  When she arrived to the ED she was feeling better.  Patient has been having intermittent chest discomfort while in the ED.  Troponins have been mildly elevated.  I spoke with Dr. Stanford Breed cardiology and he wanted the hospitalist to admit to a cardiac telemetry bed at [facility] and cardiology will see the patient also   Milton Ferguson, MD 05/09/22 [phone]

## 2022-05-09 NOTE — Assessment & Plan Note (Signed)
S/p PPM 06/2016 Follows Dr. Curt Bears

## 2022-05-09 NOTE — Assessment & Plan Note (Signed)
Cardiology consulted, Dr. Darleene Cleaver to [facility] -continue plavix -continue statin -continue coreg -trend troponins -Echo

## 2022-05-09 NOTE — Progress Notes (Signed)
ANTICOAGULATION CONSULT NOTE - Initial Consult  Pharmacy Consult for Heparin Indication: chest pain/ACS  Allergies  Allergen Reactions   Penicillins Hives    Has patient had a PCN reaction causing immediate rash, facial/tongue/throat swelling, SOB or lightheadedness with hypotension: Yes Has patient had a PCN reaction causing severe rash involving mucus membranes or skin necrosis: No Has patient had a PCN reaction that required hospitalization No Has patient had a PCN reaction occurring within the last 10 years: No If all of the above answers are "NO", then may proceed with Cephalosporin use.   Percocet [Oxycodone-Acetaminophen] Nausea And Vomiting    Patient Measurements: Height: '5\' 3"'$  (160 cm) Weight: 61 kg (134 lb 7.7 oz) IBW/kg (Calculated) : 52.4   Vital Signs: Temp: 97.9 F (36.6 C) (11/18 1513) Temp Source: Oral (11/18 1513) BP: 172/98 (11/18 1618) Pulse Rate: 106 (11/18 1618)  Labs: Recent Labs    05/09/22 0534 05/09/22 0652  HGB 13.4  --   HCT 41.9  --   PLT 238  --   CREATININE 1.09*  --   TROPONINIHS 20* 24*    Estimated Creatinine Clearance: 36.3 mL/min (A) (by C-G formula based on SCr of 1.09 mg/dL (H)).   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 h/o PAF/flutter during admission in 2013 for PNA. b. PAF during adm for NSTEMI 07/2015, subsequent paroxysms since then.   B12 deficiency anemia    Branch retinal vein occlusion with macular edema of right eye 10/11/2019   Chronic renal failure, stage 3b ([place]) 09/23/2017   Coronary artery disease 11/30/2014   a. remote MI. b. h/o PTCA with scoring balloon to OM1 11/2014. c. NSTEMI 03/2015  s/p DES to prox-mid Cx. d. NSTEMI 07/2015 s/p scoring balloon/PTCA/DES to dRCA with PAF during that admission   Cutaneous lupus erythematosus    Early stage nonexudative age-related macular degeneration of right eye 02/27/2020   Essential hypertension    GERD (gastroesophageal reflux disease)    History of blood transfusion 1980's   2nd surgical procedures   Hypercholesteremia    Hypothyroidism    Myocardial infarction ([place]) 02/2012   NSTEMI (non-ST elevated myocardial infarction) ([place]) 04/02/2015   OSA (obstructive sleep apnea) 05/13/2016   Ovarian tumor    PAD (peripheral artery disease) ([place])    a. s/p LE angio 2015; followed by Dr. Fletcher Anon - managed medically.   Pericardial effusion    a. 06/2016 after ppm - s/p pericardiocentesis.   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 s/p Medtronic PPM 06/2016, c/b lead perf/pericardial effusion.   TIA (transient ischemic attack)    Type 2 diabetes with nephropathy ([place]) 02/29/2012     Assessment: 75yof Hx CAD admitted with chest pain.  Pharmacy consulted for heparin drip.  On apixaban PTA last dose 11/17 pm.   Apixaban can falsely increase heparin level will dose heparin with aptt until apixaban cleared  and heparin level and aptt correlate.   Admit CBC stable   Goal of Therapy:  Heparin level 0.3-0.7 units/ml aPTT 66-103  seconds Monitor platelets by anticoagulation protocol: Yes   Plan:  No bolus  Heparin drip 700 uts/hr  Draw aptt 6hr after start Daily Heparin level, aptt, cbc  Monitor s/s bleeding    Bonnita Nasuti Pharm.D. CPP, BCPS Clinical Pharmacist [phone] 05/09/2022 5:06 PM

## 2022-05-09 NOTE — Assessment & Plan Note (Signed)
Continue olmesartan and carvedilol

## 2022-05-09 NOTE — Assessment & Plan Note (Signed)
Baselin creatinine 1.1-1.4 Monitor serial BMP

## 2022-05-09 NOTE — Hospital Course (Signed)
76 year old female with a history of diabetes mellitus type 2, hypertension, hyperlipidemia, CKD stage III, paroxysmal atrial fibrillation, HFpEF, coronary disease status post angioplasty of the OM1 in 2016, DES to the circumflex in 2016, and DES to the RCA in 2017 presenting with substernal chest pain that woke her up in the early morning 05/09/2022.  The patient has some shortness of breath and diaphoresis.  She denies any dizziness or syncope.  She denies any nausea, vomiting.  She has not had any fevers, chills, coughing, hemoptysis.  Notably, the patient had a mechanical fall about 5 days prior to this admission.  She went to urgent care on 05/07/2022 because of left-sided rib pain.  X-rays of her ribs at that time were negative for any fractures.  She continues to have pleuritic rib pain on the left side. In the ED, the patient was afebrile and hemodynamically stable with oxygen saturation 99% room air.  Heart rate was mildly elevated 105-110.  WBC 8.3, hemoglobin 13.4, platelets 230,000.  Sodium 142, potassium 2.9, bicarbonate 24, serum creatinine 1.09.  Troponin 20>>24.  EKG showed sinus tachycardia with IVCD.  Chest x-ray was negative for any infiltrates or edema.  Cardiology was consulted, Dr. Stanford Breed who recommended transfer to [facility] for evaluation. Notably, the patient was given nitroglycerin x1 in the ED with relief of her chest discomfort.  Her chest discomfort subsequently returned and she was given a second nitroglycerin with relief once again.

## 2022-05-10 ENCOUNTER — Inpatient Hospital Stay (HOSPITAL_COMMUNITY): Payer: HMO

## 2022-05-10 DIAGNOSIS — R079 Chest pain, unspecified: Secondary | ICD-10-CM | POA: Diagnosis not present

## 2022-05-10 DIAGNOSIS — E782 Mixed hyperlipidemia: Secondary | ICD-10-CM

## 2022-05-10 DIAGNOSIS — E039 Hypothyroidism, unspecified: Secondary | ICD-10-CM

## 2022-05-10 DIAGNOSIS — I10 Essential (primary) hypertension: Secondary | ICD-10-CM | POA: Diagnosis not present

## 2022-05-10 DIAGNOSIS — I4891 Unspecified atrial fibrillation: Secondary | ICD-10-CM | POA: Diagnosis not present

## 2022-05-10 DIAGNOSIS — I2089 Other forms of angina pectoris: Secondary | ICD-10-CM | POA: Diagnosis not present

## 2022-05-10 DIAGNOSIS — N1832 Chronic kidney disease, stage 3b: Secondary | ICD-10-CM | POA: Diagnosis not present

## 2022-05-10 DIAGNOSIS — I5032 Chronic diastolic (congestive) heart failure: Secondary | ICD-10-CM | POA: Diagnosis not present

## 2022-05-10 LAB — CBC
HCT: 39.7 % (ref 36.0–46.0)
Hemoglobin: 13.3 g/dL (ref 12.0–15.0)
MCH: 31.1 pg (ref 26.0–34.0)
MCHC: 33.5 g/dL (ref 30.0–36.0)
MCV: 93 fL (ref 80.0–100.0)
Platelets: 220 10*3/uL (ref 150–400)
RBC: 4.27 MIL/uL (ref 3.87–5.11)
RDW: 15.6 % — ABNORMAL HIGH (ref 11.5–15.5)
WBC: 9.9 10*3/uL (ref 4.0–10.5)
nRBC: 0 % (ref 0.0–0.2)

## 2022-05-10 LAB — LIPID PANEL
Cholesterol: 96 mg/dL (ref 0–200)
HDL: 30 mg/dL — ABNORMAL LOW (ref >40)
LDL Cholesterol: 35 mg/dL (ref 0–99)
Total CHOL/HDL Ratio: 3.2 RATIO
Triglycerides: 156 mg/dL — ABNORMAL HIGH (ref <150)
VLDL: 31 mg/dL (ref 0–40)

## 2022-05-10 LAB — ECHOCARDIOGRAM COMPLETE
AR max vel: 1.93 cm2
AV Area VTI: 1.35 cm2
AV Area mean vel: 1.83 cm2
AV Mean grad: 2 mmHg
AV Peak grad: 2.8 mmHg
Ao pk vel: 0.83 m/s
Calc EF: 33.2 %
Height: 63 in
S' Lateral: 2.8 cm
Single Plane A2C EF: 30.3 %
Single Plane A4C EF: 32.5 %
Weight: 2151.69 oz

## 2022-05-10 LAB — BASIC METABOLIC PANEL
Anion gap: 9 (ref 5–15)
BUN: 17 mg/dL (ref 8–23)
CO2: 23 mmol/L (ref 22–32)
Calcium: 8.8 mg/dL — ABNORMAL LOW (ref 8.9–10.3)
Chloride: 110 mmol/L (ref 98–111)
Creatinine, Ser: 1.35 mg/dL — ABNORMAL HIGH (ref 0.44–1.00)
GFR, Estimated: 41 mL/min — ABNORMAL LOW (ref >60)
Glucose, Bld: 123 mg/dL — ABNORMAL HIGH (ref 70–99)
Potassium: 3.7 mmol/L (ref 3.5–5.1)
Sodium: 142 mmol/L (ref 135–145)

## 2022-05-10 LAB — GLUCOSE, CAPILLARY
Glucose-Capillary: 190 mg/dL — ABNORMAL HIGH (ref 70–99)
Glucose-Capillary: 125 mg/dL — ABNORMAL HIGH (ref 70–99)
Glucose-Capillary: 170 mg/dL — ABNORMAL HIGH (ref 70–99)

## 2022-05-10 LAB — HEPARIN LEVEL (UNFRACTIONATED)
Heparin Unfractionated: >1.1 IU/mL — ABNORMAL HIGH (ref 0.30–0.70)
Heparin Unfractionated: >1.1 IU/mL — ABNORMAL HIGH (ref 0.30–0.70)

## 2022-05-10 LAB — MAGNESIUM: Magnesium: 1.8 mg/dL (ref 1.7–2.4)

## 2022-05-10 LAB — APTT
aPTT: 44 seconds — ABNORMAL HIGH (ref 24–36)
aPTT: 66 seconds — ABNORMAL HIGH (ref 24–36)

## 2022-05-10 MED ORDER — INSULIN ASPART 100 UNIT/ML IJ SOLN
0.0000 [IU] | Freq: Three times a day (TID) | INTRAMUSCULAR | Status: DC
  Administered 2022-05-11 – 2022-05-12 (×2): 1 [IU] via SUBCUTANEOUS

## 2022-05-10 MED ORDER — INSULIN ASPART 100 UNIT/ML IJ SOLN: 0.0000 [IU] | Freq: Every day | INTRAMUSCULAR | Status: DC

## 2022-05-10 MED ORDER — SODIUM CHLORIDE 0.9 % WEIGHT BASED INFUSION
1.0000 mL/kg/h | INTRAVENOUS | Status: DC
  Administered 2022-05-11: 1 mL/kg/h via INTRAVENOUS

## 2022-05-10 MED ORDER — ASPIRIN 81 MG PO CHEW
81.0000 mg | CHEWABLE_TABLET | ORAL | Status: AC
  Administered 2022-05-11: 81 mg via ORAL
  Filled 2022-05-10: qty 1

## 2022-05-10 MED ORDER — SODIUM CHLORIDE 0.9% FLUSH: 3.0000 mL | INTRAVENOUS | Status: DC | PRN

## 2022-05-10 MED ORDER — SODIUM CHLORIDE 0.9 % WEIGHT BASED INFUSION
3.0000 mL/kg/h | INTRAVENOUS | Status: DC
  Administered 2022-05-11: 3 mL/kg/h via INTRAVENOUS

## 2022-05-10 MED ORDER — SODIUM CHLORIDE 0.9 % IV SOLN: 250.0000 mL | INTRAVENOUS | Status: DC | PRN

## 2022-05-10 NOTE — Progress Notes (Signed)
Patient declined cath video

## 2022-05-10 NOTE — Progress Notes (Signed)
[place] for Heparin Indication: chest pain/ACS, afib  Allergies  Allergen Reactions   Penicillins Hives    Has patient had a PCN reaction causing immediate rash, facial/tongue/throat swelling, SOB or lightheadedness with hypotension: Yes Has patient had a PCN reaction causing severe rash involving mucus membranes or skin necrosis: No Has patient had a PCN reaction that required hospitalization No Has patient had a PCN reaction occurring within the last 10 years: No If all of the above answers are "NO", then may proceed with Cephalosporin use.   Percocet [Oxycodone-Acetaminophen] Nausea And Vomiting    Patient Measurements: Height: '5\' 3"'$  (160 cm) Weight: 61 kg (134 lb 7.7 oz) IBW/kg (Calculated) : 52.4   Vital Signs: Temp: 97.8 F (36.6 C) (11/19 0019) Temp Source: Oral (11/19 0019) BP: 140/88 (11/19 0019) Pulse Rate: 109 (11/19 0019)  Labs: Recent Labs    05/09/22 0534 05/09/22 0652 05/09/22 2319 05/10/22 0158  HGB 13.4  --   --  13.3  HCT 41.9  --   --  39.7  PLT 238  --   --  220  APTT  --   --  56* 44*  HEPARINUNFRC  --   --   --  >1.10*  CREATININE 1.09*  --   --  1.35*  TROPONINIHS 20* 24*  --   --      Estimated Creatinine Clearance: 29.3 mL/min (A) (by C-G formula based on SCr of 1.35 mg/dL (H)).   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 h/o PAF/flutter during admission in 2013 for PNA. b. PAF during adm for NSTEMI 07/2015, subsequent paroxysms since then.   B12 deficiency anemia    Branch retinal vein occlusion with macular edema of right eye 10/11/2019   Chronic renal failure, stage 3b  ([place]) 09/23/2017   Coronary artery disease 11/30/2014   a. remote MI. b. h/o PTCA with scoring balloon to OM1 11/2014. c. NSTEMI 03/2015 s/p DES to prox-mid Cx. d. NSTEMI 07/2015 s/p scoring balloon/PTCA/DES to dRCA with PAF during that admission   Cutaneous lupus erythematosus    Early stage nonexudative age-related macular degeneration of right eye 02/27/2020   Essential hypertension    GERD (gastroesophageal reflux disease)    History of blood transfusion 1980's   2nd surgical procedures   Hypercholesteremia    Hypothyroidism    Myocardial infarction ([place]) 02/2012   NSTEMI (non-ST elevated myocardial infarction) ([place]) 04/02/2015   OSA (obstructive sleep apnea) 05/13/2016   Ovarian tumor    PAD (peripheral artery disease) ([place])    a. s/p LE angio 2015; followed by Dr. Fletcher Anon - managed medically.   Pericardial effusion    a. 06/2016 after ppm - s/p pericardiocentesis.   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 s/p Medtronic PPM 06/2016, c/b lead perf/pericardial effusion.   TIA (transient ischemic attack)    Type  2 diabetes with nephropathy ([place]) 02/29/2012     Assessment: Virginia Huber Hx CAD admitted with chest pain.  Pharmacy consulted for heparin drip.  On apixaban PTA last dose 11/17 pm.   Apixaban can falsely increase heparin level will dose heparin with aptt until apixaban cleared and heparin level and aptt correlate.   Admit CBC stable   11/19 AM update:  aPTT sub-therapeutic   Goal of Therapy:  Heparin level 0.3-0.7 units/ml aPTT 66-102 seconds Monitor platelets by anticoagulation protocol: Yes   Plan:  Inc heparin to 850 units/hr 1100 heparin level and  aPTT  Narda Bonds, PharmD, BCPS Clinical Pharmacist Phone: [phone]

## 2022-05-10 NOTE — Progress Notes (Signed)
[facility] PROGRESS NOTE  RAUSHANAH OSMUNDSON (DOB: [birth year]) KYH:[id number] PCP: Sharilyn Sites, MD  Brief Narrative: Virginia Huber is a 76 year old female with a history of diabetes mellitus type 2, hypertension, hyperlipidemia, CKD stage III, paroxysmal atrial fibrillation, HFpEF, coronary disease status post angioplasty of the OM1 in 2016, DES to the circumflex in 2016, and DES to the RCA in 2017 who presented to the ED at [facility] 11/18 with substernal chest pain that woke her up in the early morning. The patient has some shortness of breath and diaphoresis.  She denies any dizziness or syncope.  She denies any nausea, vomiting.  She has not had any fevers, chills, coughing, hemoptysis.  Notably, the patient had a mechanical fall about 5 days prior to this admission.  She went to urgent care on 05/07/2022 because of left-sided rib pain.  X-rays of her ribs at that time were negative for any fractures.  She continues to have pleuritic rib pain on the left side.  In the ED, the patient was afebrile and hemodynamically stable with oxygen saturation 99% room air.  Heart rate was mildly elevated 105-110.  WBC 8.3, hemoglobin 13.4, platelets 230,000.  Sodium 142, potassium 2.9, bicarbonate 24, serum creatinine 1.09.  Troponin 20>>24.  EKG showed sinus tachycardia with IVCD.  Chest x-ray was negative for any infiltrates or edema.  Cardiology was consulted, Dr. Stanford Breed who recommended transfer to [facility] for evaluation. Notably, the patient was given nitroglycerin x1 in the ED with relief of her chest discomfort.  Her chest discomfort subsequently returned and she was given a second nitroglycerin with relief once again.  Cardiac monitoring has revealed atypical atrial flutter for which EP was consulted. She has an ablation scheduled in February 2024. EP was consulted, recommends continuing amiodarone, will see if ablation procedure can be moved sooner.  Cardiac catheterization is planned 11/20.    Subjective: No chest pain or dyspnea at this time. NTG helped CP and hasn't recurred. No other complaints at this time.   Objective: BP 130/79 (BP Location: Left Arm)   Pulse (!) 110   Temp (!) 97.2 F (36.2 C) (Oral)   Resp 18   Ht '5\' 3"'$  (1.6 m)   Wt 61 kg   SpO2 96%   BMI 23.82 kg/m   Gen: No distress Pulm: Clear, nonlabored  CV: Irreg irreg, no MRG or pitting edema GI: Soft, NT, ND, +BS  Neuro: Alert and oriented. No new focal deficits. Ext: Warm, no deformities Skin: No rashes, lesions or ulcers on visualized skin   Assessment & Plan: Principal Problem:   Angina at rest Active Problems:   Mixed hyperlipidemia   Essential hypertension   Hypothyroidism   Type 2 diabetes with nephropathy (HCC)   Hypokalemia   Tachy-brady syndrome (HCC)   Chronic renal failure, stage 3b (HCC)   Paroxysmal atrial fibrillation (HCC)   Chronic heart failure with preserved ejection fraction (HFpEF) (HCC)  Angina in patient with CAD:  - Continue plavix, statin, coreg.  - Echo pending - prn NTG - Appreciate cardiology recommendations, planning LHC in AM   Chronic HFpEF: Appears euvolemic - Continue coreg   Paroxysmal atrial fibrillation, atrial flutter:  - EP consulted, continue beta blocker and amiodarone, with half life unable to transition to alternative antiarrhythmic at this time.  - Continue heparin gtt  - Depending on cath results, EP favors DCCV prior to discharge.     Stage IIIb CKD: Baselin creatinine 1.1-1.4, remains within that range. - Avoid nephrotoxins  as able, will need contrast with cath.  - Monitor serial BMP   Tachy-brady syndrome [facility]) S/p PPM 06/2016 Follows Dr. Curt Bears   Hypokalemia: Resolved with supplementation.   T2DM: 02/09/22 A1C--6.0 - Start sensitive SSI   Hypothyroidism: TSH 0.571 - Continue levothyroxine   Essential hypertension - Continue olmesartan and carvedilol   Mixed hyperlipidemia - Continue atorvastatin  Patrecia Pour, MD [facility] [email or web address] 05/10/2022, 3:03 PM

## 2022-05-10 NOTE — H&P (View-Only) (Signed)
Electrophysiology Consultation   Patient ID: Virginia Huber MRN: [id number]; DOB: [birth year]  Admit date: 05/09/2022 Date of Consult: 05/10/2022  PCP:  Sharilyn Sites, MD   [place] [facility] Cardiologist:  Rozann Lesches, MD  Electrophysiologist:  Will Meredith Leeds, MD  {   Patient Profile:   Virginia Huber is a 76 y.o. female with a hx of atrial fibrillation, hypertension, diabetes, tachybradycardia, chronic diastolic heart failure who is being seen 05/10/2022 for the evaluation of atrial fibrillation at the request of Dr. Johnsie Cancel.  History of Present Illness:   Ms. Ayllon was admitted to the hospital yesterday from [facility] with angina.  She has a history of coronary disease with stents to the circumflex, OM and RCA in 2017.  She has a history of tachybradycardia syndrome with pacemaker that was implanted in 2018.  For her atrial fibrillation she is on Eliquis and amiodarone.  She is scheduled for a catheter ablation in February with Dr. Curt Bears.  I am asked to weigh in about alternative options for rhythm control given her intolerance to amiodarone with tremors.   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 h/o PAF/flutter during admission in 2013 for PNA. b. PAF during adm for NSTEMI 07/2015, subsequent paroxysms since then.   B12 deficiency anemia    Branch retinal vein occlusion with macular edema of right eye 10/11/2019   Chronic renal failure, stage 3b ([place]) 09/23/2017   Coronary artery disease 11/30/2014   a. remote MI. b. h/o PTCA with scoring balloon to OM1 11/2014. c. NSTEMI 03/2015 s/p DES to prox-mid Cx. d. NSTEMI 07/2015 s/p scoring  balloon/PTCA/DES to dRCA with PAF during that admission   Cutaneous lupus erythematosus    Early stage nonexudative age-related macular degeneration of right eye 02/27/2020   Essential hypertension    GERD (gastroesophageal reflux disease)    History of blood transfusion 1980's   2nd surgical procedures   Hypercholesteremia    Hypothyroidism    Myocardial infarction ([place]) 02/2012   NSTEMI (non-ST elevated myocardial infarction) ([place]) 04/02/2015   OSA (obstructive sleep apnea) 05/13/2016   Ovarian tumor    PAD (peripheral artery disease) ([place])    a. s/p LE angio 2015; followed by Dr. Fletcher Anon - managed medically.   Pericardial effusion    a. 06/2016 after ppm - s/p pericardiocentesis.   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 s/p Medtronic PPM 06/2016, c/b lead perf/pericardial effusion.   TIA (transient ischemic attack)    Type 2 diabetes with nephropathy ([place]) 02/29/2012    Past Surgical History:  Procedure Laterality Date   ABDOMINAL AORTAGRAM N/A 01/03/2014   Procedure: ABDOMINAL [name];  Surgeon: Wellington Hampshire, MD;  Location: [place] CATH LAB;  Service: Cardiovascular;  Laterality: N/A;   ABDOMINAL HYSTERECTOMY  06/22/1970   "partial"   APPENDECTOMY  02/20/1969   CARDIAC CATHETERIZATION  06/22/2006   Tiny OM-2  with 90% narrowing. Med tx.   CARDIAC CATHETERIZATION N/A 11/30/2014   Procedure: Left Heart Cath and Coronary Angiography;  Surgeon: Troy Sine, MD; LAD 20%, CFX 50%, OM1 95%, right PLB 30%, LV normal    CARDIAC CATHETERIZATION N/A 11/30/2014   Procedure: Coronary Balloon Angioplasty;  Surgeon: Troy Sine, MD;  Angiosculpt scoring balloon and  PTCA to the OM1 reducing stenosis from 95% to less than 10%   CARDIAC CATHETERIZATION N/A 04/03/2015   Procedure: Left Heart Cath and Coronary Angiography;  Surgeon: Jolaine Artist, MD; dLAD 50%, CFX 90%, OM1 100%, PLA 15%, LVEDP 13     CARDIAC CATHETERIZATION N/A 04/03/2015   Procedure: Coronary Stent Intervention;  Surgeon: Sherren Mocha, MD; 3.0x18 mm Xience DES to the CFX     CARDIAC CATHETERIZATION N/A 08/02/2015   Procedure: Left Heart Cath and Coronary Angiography;  Surgeon: Troy Sine, MD;  Location: [place] CV LAB;  Service: Cardiovascular;  Laterality: N/A;   CARDIAC CATHETERIZATION N/A 08/02/2015   Procedure: Coronary Stent Intervention;  Surgeon: Troy Sine, MD;  Location: [place] CV LAB;  Service: Cardiovascular;  Laterality: N/A;   CARDIAC CATHETERIZATION N/A 06/25/2016   Procedure: Pericardiocentesis;  Surgeon: Will Meredith Leeds, MD;  Location: [place] CV LAB;  Service: Cardiovascular;  Laterality: N/A;   cardiac stents     CARDIOVERSION N/A 12/15/2017   Procedure: CARDIOVERSION;  Surgeon: Acie Fredrickson Wonda Cheng, MD;  Location: [facility] ENDOSCOPY;  Service: Cardiovascular;  Laterality: N/A;   CHOLECYSTECTOMY OPEN  02/20/1989   COLONOSCOPY  06/23/2003   Dr. Laural Golden: pancolonic divericula, polyp, path unknown currently   COLONOSCOPY  06/22/2010   Dr. Oneida Alar: Normal TI, scattered diverticula in entire colon, small internal hemorrhoids, normal colon biopsies. Colonoscopy in 5-10 years.    COLONOSCOPY WITH PROPOFOL N/A 06/02/2021   pancolonic diverticulosis. Two 4-6 mm polyps in transverse colon. Sessile serrated and hyperplastic. 5 year surveillance if benefits outweigh the risks.   COLOSTOMY  05/23/1979   COLOSTOMY REVERSAL  11/21/1979   EP IMPLANTABLE DEVICE N/A 06/25/2016   Procedure: Lead Revision/Repair;  Surgeon: Will Meredith Leeds, MD;  Location: [place] CV LAB;  Service: Cardiovascular;  Laterality: N/A;   EP IMPLANTABLE DEVICE N/A 06/25/2016    Procedure: Pacemaker Implant;  Surgeon: Will Meredith Leeds, MD;  Location: [place] CV LAB;  Service: Cardiovascular;  Laterality: N/A;   EXCISIONAL HEMORRHOIDECTOMY  02/20/1969   EYE SURGERY Left 06/22/1998   "branch vein occlusion"   EYE SURGERY Left ~ 2001   "smoothed out wrinkle"   LEFT OOPHORECTOMY  05/23/1979   nicked bowel, peritonitis, colostomy; colostomy reversed 1981    LOWER EXTREMITY ANGIOGRAM N/A 01/03/2014   Procedure: LOWER EXTREMITY ANGIOGRAM;  Surgeon: Wellington Hampshire, MD;  Location: [place] CATH LAB;  Service: Cardiovascular;  Laterality: N/A;   Nuclear med stress test  10/21/2011   Small area of mild ischemia inferoapically.   PARTIAL HYSTERECTOMY  02/20/1969   left ovaries, then ovaries removed later due tumors    POLYPECTOMY  06/02/2021   Procedure: POLYPECTOMY;  Surgeon: Eloise Harman, DO;  [facility];  Service: Endoscopy;;   right eye surgery  03/2021   RIGHT OOPHORECTOMY  02/20/1969       Inpatient Medications: Scheduled Meds:  amiodarone  100 mg Oral Daily   atorvastatin  80 mg Oral Daily   [START ON 05/11/2022] calcitRIOL  0.25 mcg Oral Once per day on Mon Wed Fri   carvedilol  12.5 mg Oral BID  cholecalciferol  2,000 Units Oral QPM   clopidogrel  75 mg Oral Daily   irbesartan  37.5 mg Oral Daily   levothyroxine  112 mcg Oral QAC breakfast   rOPINIRole  0.5 mg Oral QHS   sodium chloride flush  3 mL Intravenous Q12H   Continuous Infusions:  heparin 850 Units/hr (05/10/22 0320)   PRN Meds: acetaminophen **OR** acetaminophen, ondansetron **OR** ondansetron (ZOFRAN) IV  Allergies:    Allergies  Allergen Reactions   Penicillins Hives    Has patient had a PCN reaction causing immediate rash, facial/tongue/throat swelling, SOB or lightheadedness with hypotension: Yes Has patient had a PCN reaction causing severe rash involving mucus membranes or skin necrosis: No Has patient had a PCN reaction that required hospitalization No Has  patient had a PCN reaction occurring within the last 10 years: No If all of the above answers are "NO", then may proceed with Cephalosporin use.   Percocet [Oxycodone-Acetaminophen] Nausea And Vomiting    Social History:   Social History   Socioeconomic History   Marital status: Married    Spouse name: Not on file   Number of children: Not on file   Years of education: Not on file   Highest education level: Not on file  Occupational History   Occupation: Retired    Fish farm manager: RETIRED    Comment: insurance billing  Tobacco Use   Smoking status: Never    Passive exposure: Never   Smokeless tobacco: Never   Tobacco comments:    Never smoked  Vaping Use   Vaping Use: Never used  Substance and Sexual Activity   Alcohol use: No    Alcohol/week: 0.0 standard drinks of alcohol   Drug use: No   Sexual activity: Never    Birth control/protection: Surgical    Comment: hyst  Other Topics Concern   Not on file  Social History Narrative   Not on file   Social Determinants of Health   Financial Resource Strain: Not on file  Food Insecurity: Not on file  Transportation Needs: Not on file  Physical Activity: Not on file  Stress: Not on file  Social Connections: Not on file  Intimate Partner Violence: Not on file    Family History:    Family History  Problem Relation Age of Onset   Heart disease Mother        deceased   Heart disease Father        deceased, heart disease   Diabetes Brother    Heart disease Brother    Thyroid disease Brother    Heart disease Sister    Heart disease Brother    Thyroid disease Brother    Lupus Daughter    Colon cancer Neg Hx      ROS:  Please see the history of present illness.   All other ROS reviewed and negative.     Physical Exam/Data:   Vitals:   05/09/22 2020 05/10/22 0019 05/10/22 0355 05/10/22 0758  BP: 126/85 (!) 140/88 (!) 135/95 (!) 156/94  Pulse: (!) 112 (!) 104 (!) 113 (!) 113  Resp: '16 16 18 18  '$ Temp: 98.1 F  (36.7 C) 97.8 F (36.6 C) 98 F (36.7 C) (!) 97.5 F (36.4 C)  TempSrc: Oral Oral Oral Oral  SpO2: 94% 96% 97% 99%  Weight:      Height:        Intake/Output Summary (Last 24 hours) at 05/10/2022 0948 Last data filed at 05/10/2022 0500 Gross per 24 hour  Intake 534.2 ml  Output --  Net 534.2 ml      05/09/2022    5:08 AM 05/07/2022   12:48 PM 03/17/2022   10:45 AM  Last 3 Weights  Weight (lbs) 134 lb 7.7 oz 134 lb 8 oz 133 lb 3.2 oz  Weight (kg) 61 kg 61.009 kg 60.419 kg     Body mass index is 23.82 kg/m.  General:  Well nourished, well developed, in no acute distress HEENT: normal Neck: no JVD Vascular: No carotid bruits; Distal pulses 2+ bilaterally Cardiac:  normal S1, S2; RRR; no murmur.  Tachycardic Lungs:  clear to auscultation bilaterally, no wheezing, rhonchi or rales  Abd: soft, nontender, no hepatomegaly  Ext: no edema Musculoskeletal:  No deformities, BUE and BLE strength normal and equal Skin: warm and dry  Neuro:  CNs 2-12 intact, no focal abnormalities noted Psych:  Normal affect   EKG:  The EKG was personally reviewed and demonstrates: Atrial flutter Telemetry:  Telemetry was personally reviewed and demonstrates: Atrial flutter with ventricular rate about 110 bpm on average  Relevant CV Studies: Echo pending  Last remote interrogation shows a burden of atrial arrhythmia of 0.3%  Laboratory Data:  High Sensitivity Troponin:   Recent Labs  Lab 05/09/22 0534 05/09/22 0652  TROPONINIHS 20* 24*     Chemistry Recent Labs  Lab 05/09/22 0534 05/10/22 0158  NA 142 142  K 2.9* 3.7  CL 113* 110  CO2 24 23  GLUCOSE 114* 123*  BUN 14 17  CREATININE 1.09* 1.35*  CALCIUM 8.0* 8.8*  MG  --  1.8  GFRNONAA 53* 41*  ANIONGAP 5 9    No results for input(s): "PROT", "ALBUMIN", "AST", "ALT", "ALKPHOS", "BILITOT" in the last 168 hours. Lipids  Recent Labs  Lab 05/10/22 0158  CHOL 96  TRIG 156*  HDL 30*  LDLCALC 35  CHOLHDL 3.2     Hematology Recent Labs  Lab 05/09/22 0534 05/10/22 0158  WBC 8.3 9.9  RBC 4.41 4.27  HGB 13.4 13.3  HCT 41.9 39.7  MCV 95.0 93.0  MCH 30.4 31.1  MCHC 32.0 33.5  RDW 15.5 15.6*  PLT 238 220   Thyroid No results for input(s): "TSH", "FREET4" in the last 168 hours.  BNPNo results for input(s): "BNP", "PROBNP" in the last 168 hours.  DDimer No results for input(s): "DDIMER" in the last 168 hours.   Radiology/Studies:  DG Chest Portable 1 View  Result Date: 05/09/2022 CLINICAL DATA:  Chest pain starting at 4 a.m. EXAM: PORTABLE CHEST 1 VIEW COMPARISON:  05/07/2022 FINDINGS: Normal heart size and mediastinal contours. Dual-chamber pacer leads from the left. There is no edema, consolidation, effusion, or pneumothorax. IMPRESSION: No acute finding, stable from prior. Electronically Signed   By: Jorje Guild M.D.   On: 05/09/2022 05:57   DG Ribs Unilateral W/Chest Left  Result Date: 05/07/2022 CLINICAL DATA:  Recent fall, left chest pain EXAM: LEFT RIBS AND CHEST - 3+ VIEW COMPARISON:  10/24/2019 FINDINGS: Cardiac size is within normal limits. Pacemaker battery is seen in left infraclavicular region. Surgical clips are seen in right upper quadrant. There are no signs of pulmonary edema or focal pulmonary consolidation. There is interval clearing of pulmonary vascular congestion and pulmonary edema. There is no pleural effusion or pneumothorax. No displaced fractures are seen in left ribs. IMPRESSION: No displaced fractures are seen in left ribs. There are no signs of pulmonary edema or focal pulmonary consolidation. There is no pleural effusion or pneumothorax. Electronically  Signed   By: Elmer Picker M.D.   On: 05/07/2022 15:13     Assessment and Plan:   #Atrial fibrillation/flutter Poor control on amiodarone.  Also experiencing tremors while on amiodarone.  Ablation is scheduled for February.  Unfortunately, given amiodarone's use, would not be to transition to an alternative  antiarrhythmic drug given the prolonged washout required for amiodarone.  I will discuss her case with Dr. Curt Bears to see if there is any availability for a sooner ablation appointment.  For now, recommend continuing low-dose amiodarone. Cont coreg  Pending the results of the heart catheterization on Monday, favor cardioversion prior to discharge.  #Chest pain In the setting of coronary artery disease a left heart catheterization is planned by Dr. Johnsie Cancel.  Keep n.p.o. after midnight.  For questions or updates, please contact [facility] Please consult [email or web address] for contact info under    Signed, Vickie Epley, MD  05/10/2022 9:48 AM

## 2022-05-10 NOTE — Progress Notes (Signed)
[place] for Heparin Indication: chest pain/ACS, afib  Allergies  Allergen Reactions   Penicillins Hives    Has patient had a PCN reaction causing immediate rash, facial/tongue/throat swelling, SOB or lightheadedness with hypotension: Yes Has patient had a PCN reaction causing severe rash involving mucus membranes or skin necrosis: No Has patient had a PCN reaction that required hospitalization No Has patient had a PCN reaction occurring within the last 10 years: No If all of the above answers are "NO", then may proceed with Cephalosporin use.   Percocet [Oxycodone-Acetaminophen] Nausea And Vomiting    Patient Measurements: Height: '5\' 3"'$  (160 cm) Weight: 61 kg (134 lb 7.7 oz) IBW/kg (Calculated) : 52.4   Vital Signs: Temp: 98 F (36.7 C) (11/19 0355) Temp Source: Oral (11/19 0355) BP: 135/95 (11/19 0355) Pulse Rate: 113 (11/19 0355)  Labs: Recent Labs    05/09/22 0534 05/09/22 0652 05/09/22 2319 05/10/22 0158  HGB 13.4  --   --  13.3  HCT 41.9  --   --  39.7  PLT 238  --   --  220  APTT  --   --  56* 44*  HEPARINUNFRC  --   --   --  >1.10*  CREATININE 1.09*  --   --  1.35*  TROPONINIHS 20* 24*  --   --      Estimated Creatinine Clearance: 29.3 mL/min (A) (by C-G formula based on SCr of 1.35 mg/dL (H)).   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 h/o PAF/flutter during admission in 2013 for PNA. b. PAF during adm for NSTEMI 07/2015, subsequent paroxysms since then.   B12 deficiency anemia    Branch retinal vein occlusion with macular edema of right eye 10/11/2019   Chronic renal failure, stage 3b ([place])  09/23/2017   Coronary artery disease 11/30/2014   a. remote MI. b. h/o PTCA with scoring balloon to OM1 11/2014. c. NSTEMI 03/2015 s/p DES to prox-mid Cx. d. NSTEMI 07/2015 s/p scoring balloon/PTCA/DES to dRCA with PAF during that admission   Cutaneous lupus erythematosus    Early stage nonexudative age-related macular degeneration of right eye 02/27/2020   Essential hypertension    GERD (gastroesophageal reflux disease)    History of blood transfusion 1980's   2nd surgical procedures   Hypercholesteremia    Hypothyroidism    Myocardial infarction ([place]) 02/2012   NSTEMI (non-ST elevated myocardial infarction) ([place]) 04/02/2015   OSA (obstructive sleep apnea) 05/13/2016   Ovarian tumor    PAD (peripheral artery disease) ([place])    a. s/p LE angio 2015; followed by Dr. Fletcher Anon - managed medically.   Pericardial effusion    a. 06/2016 after ppm - s/p pericardiocentesis.   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 s/p Medtronic PPM 06/2016, c/b lead perf/pericardial effusion.   TIA (transient ischemic attack)    Type  2 diabetes with nephropathy ([place]) 02/29/2012   Assessment: 75yof Hx CAD admitted with chest pain. On apixaban PTA last dose 11/17 pm. Pharmacy consulted for heparin infusion.   aPTT is at goal this AM at 66 sec. Heparin level elevated > 1.1. Hgb 13.3, platelets are WNL. No issues with infusion reported, no signs/symptoms of bleeding noted. Given recent apixaban use, will monitor heparin aPTT levels until correlating.   Goal of Therapy:  Heparin level 0.3-0.7 units/ml aPTT 66-102 seconds Monitor platelets by anticoagulation protocol: Yes   Plan:  Continue heparin  infusion at 850 units/hour Monitor daily heparin and aPTT levels until correlating Monitor daily CBC and signs/symptoms of bleeding  Louanne Belton, PharmD, [facility] PGY1 Pharmacy Resident 05/10/2022 7:10 AM

## 2022-05-10 NOTE — Progress Notes (Signed)
  Echocardiogram 2D Echocardiogram has been performed.  Virginia Huber 05/10/2022, 12:15 PM

## 2022-05-10 NOTE — Consult Note (Addendum)
Electrophysiology Consultation   Patient ID: ARLYNE BRANDES MRN: [id number]; DOB: Sep 07, 1945  Admit date: 05/09/2022 Date of Consult: 05/10/2022  PCP:  Sharilyn Sites, MD   [facility] Cardiologist:  Rozann Lesches, MD  Electrophysiologist:  Will Meredith Leeds, MD  {   Patient Profile:   SIRENIA WHITIS is a 76 y.o. female with a hx of atrial fibrillation, hypertension, diabetes, tachybradycardia, chronic diastolic heart failure who is being seen 05/10/2022 for the evaluation of atrial fibrillation at the request of Dr. Johnsie Cancel.  History of Present Illness:   Ms. Pittman was admitted to the hospital yesterday from [facility] with angina.  She has a history of coronary disease with stents to the circumflex, OM and RCA in 2017.  She has a history of tachybradycardia syndrome with pacemaker that was implanted in 2018.  For her atrial fibrillation she is on Eliquis and amiodarone.  She is scheduled for a catheter ablation in February with Dr. Curt Bears.  I am asked to weigh in about alternative options for rhythm control given her intolerance to amiodarone with tremors.   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 h/o PAF/flutter during admission in 2013 for PNA. b. PAF during adm for NSTEMI 07/2015, subsequent paroxysms since then.   B12 deficiency anemia    Branch retinal vein occlusion with macular edema of right eye 10/11/2019   Chronic renal failure, stage 3b ([place]) 09/23/2017   Coronary artery disease 11/30/2014   a. remote MI. b. h/o PTCA with scoring balloon to OM1 11/2014. c. NSTEMI 03/2015 s/p DES to prox-mid Cx. d. NSTEMI 07/2015 s/p scoring  balloon/PTCA/DES to dRCA with PAF during that admission   Cutaneous lupus erythematosus    Early stage nonexudative age-related macular degeneration of right eye 02/27/2020   Essential hypertension    GERD (gastroesophageal reflux disease)    History of blood transfusion 1980's   2nd surgical procedures   Hypercholesteremia    Hypothyroidism    Myocardial infarction ([place]) 02/2012   NSTEMI (non-ST elevated myocardial infarction) ([place]) 04/02/2015   OSA (obstructive sleep apnea) 05/13/2016   Ovarian tumor    PAD (peripheral artery disease) ([place])    a. s/p LE angio 2015; followed by Dr. Fletcher Anon - managed medically.   Pericardial effusion    a. 06/2016 after ppm - s/p pericardiocentesis.   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 s/p Medtronic PPM 06/2016, c/b lead perf/pericardial effusion.   TIA (transient ischemic attack)    Type 2 diabetes with nephropathy ([place]) 02/29/2012    Past Surgical History:  Procedure Laterality Date   ABDOMINAL AORTAGRAM N/A 01/03/2014   Procedure: ABDOMINAL [name];  Surgeon: Wellington Hampshire, MD;  Location: [facility] CATH LAB;  Service: Cardiovascular;  Laterality: N/A;   ABDOMINAL HYSTERECTOMY  06/22/1970   "partial"   APPENDECTOMY  02/20/1969   CARDIAC CATHETERIZATION  06/22/2006   Tiny OM-2  with 90% narrowing. Med tx.   CARDIAC CATHETERIZATION N/A 11/30/2014   Procedure: Left Heart Cath and Coronary Angiography;  Surgeon: Troy Sine, MD; LAD 20%, CFX 50%, OM1 95%, right PLB 30%, LV normal    CARDIAC CATHETERIZATION N/A 11/30/2014   Procedure: Coronary Balloon Angioplasty;  Surgeon: Troy Sine, MD;  Angiosculpt scoring balloon and  PTCA to the OM1 reducing stenosis from 95% to less than 10%   CARDIAC CATHETERIZATION N/A 04/03/2015   Procedure: Left Heart Cath and Coronary Angiography;  Surgeon: Jolaine Artist, MD; dLAD 50%, CFX 90%, OM1 100%, PLA 15%, LVEDP 13     CARDIAC CATHETERIZATION N/A 04/03/2015   Procedure: Coronary Stent Intervention;  Surgeon: Sherren Mocha, MD; 3.0x18 mm Xience DES to the CFX     CARDIAC CATHETERIZATION N/A 08/02/2015   Procedure: Left Heart Cath and Coronary Angiography;  Surgeon: Troy Sine, MD;  Location: [facility] CV LAB;  Service: Cardiovascular;  Laterality: N/A;   CARDIAC CATHETERIZATION N/A 08/02/2015   Procedure: Coronary Stent Intervention;  Surgeon: Troy Sine, MD;  Location: [facility] CV LAB;  Service: Cardiovascular;  Laterality: N/A;   CARDIAC CATHETERIZATION N/A 06/25/2016   Procedure: Pericardiocentesis;  Surgeon: Will Meredith Leeds, MD;  Location: [facility] CV LAB;  Service: Cardiovascular;  Laterality: N/A;   cardiac stents     CARDIOVERSION N/A 12/15/2017   Procedure: CARDIOVERSION;  Surgeon: Acie Fredrickson Wonda Cheng, MD;  Location[facility] ENDOSCOPY;  Service: Cardiovascular;  Laterality: N/A;   CHOLECYSTECTOMY OPEN  02/20/1989   COLONOSCOPY  06/23/2003   Dr. Laural Golden: pancolonic divericula, polyp, path unknown currently   COLONOSCOPY  06/22/2010   Dr. Oneida Alar: Normal TI, scattered diverticula in entire colon, small internal hemorrhoids, normal colon biopsies. Colonoscopy in 5-10 years.    COLONOSCOPY WITH PROPOFOL N/A 06/02/2021   pancolonic diverticulosis. Two 4-6 mm polyps in transverse colon. Sessile serrated and hyperplastic. 5 year surveillance if benefits outweigh the risks.   COLOSTOMY  05/23/1979   COLOSTOMY REVERSAL  11/21/1979   EP IMPLANTABLE DEVICE N/A 06/25/2016   Procedure: Lead Revision/Repair;  Surgeon: Will Meredith Leeds, MD;  Location: [facility] CV LAB;  Service: Cardiovascular;  Laterality: N/A;   EP IMPLANTABLE DEVICE N/A 06/25/2016    Procedure: Pacemaker Implant;  Surgeon: Will Meredith Leeds, MD;  Location: [facility] CV LAB;  Service: Cardiovascular;  Laterality: N/A;   EXCISIONAL HEMORRHOIDECTOMY  02/20/1969   EYE SURGERY Left 06/22/1998   "branch vein occlusion"   EYE SURGERY Left ~ 2001   "smoothed out wrinkle"   LEFT OOPHORECTOMY  05/23/1979   nicked bowel, peritonitis, colostomy; colostomy reversed 1981    LOWER EXTREMITY ANGIOGRAM N/A 01/03/2014   Procedure: LOWER EXTREMITY ANGIOGRAM;  Surgeon: Wellington Hampshire, MD;  Location: [facility] CATH LAB;  Service: Cardiovascular;  Laterality: N/A;   Nuclear med stress test  10/21/2011   Small area of mild ischemia inferoapically.   PARTIAL HYSTERECTOMY  02/20/1969   left ovaries, then ovaries removed later due tumors    POLYPECTOMY  06/02/2021   Procedure: POLYPECTOMY;  Surgeon: Eloise Harman, DO;  [facility]: [facility];  Service: Endoscopy;;   right eye surgery  03/2021   RIGHT OOPHORECTOMY  02/20/1969       Inpatient Medications: Scheduled Meds:  amiodarone  100 mg Oral Daily   atorvastatin  80 mg Oral Daily   [START ON 05/11/2022] calcitRIOL  0.25 mcg Oral Once per day on Mon Wed Fri   carvedilol  12.5 mg Oral BID  cholecalciferol  2,000 Units Oral QPM   clopidogrel  75 mg Oral Daily   irbesartan  37.5 mg Oral Daily   levothyroxine  112 mcg Oral QAC breakfast   rOPINIRole  0.5 mg Oral QHS   sodium chloride flush  3 mL Intravenous Q12H   Continuous Infusions:  heparin 850 Units/hr (05/10/22 0320)   PRN Meds: acetaminophen **OR** acetaminophen, ondansetron **OR** ondansetron (ZOFRAN) IV  Allergies:    Allergies  Allergen Reactions   Penicillins Hives    Has patient had a PCN reaction causing immediate rash, facial/tongue/throat swelling, SOB or lightheadedness with hypotension: Yes Has patient had a PCN reaction causing severe rash involving mucus membranes or skin necrosis: No Has patient had a PCN reaction that required hospitalization No Has  patient had a PCN reaction occurring within the last 10 years: No If all of the above answers are "NO", then may proceed with Cephalosporin use.   Percocet [Oxycodone-Acetaminophen] Nausea And Vomiting    Social History:   Social History   Socioeconomic History   Marital status: Married    Spouse name: Not on file   Number of children: Not on file   Years of education: Not on file   Highest education level: Not on file  Occupational History   Occupation: Retired    Fish farm manager: RETIRED    Comment: insurance billing  Tobacco Use   Smoking status: Never    Passive exposure: Never   Smokeless tobacco: Never   Tobacco comments:    Never smoked  Vaping Use   Vaping Use: Never used  Substance and Sexual Activity   Alcohol use: No    Alcohol/week: 0.0 standard drinks of alcohol   Drug use: No   Sexual activity: Never    Birth control/protection: Surgical    Comment: hyst  Other Topics Concern   Not on file  Social History Narrative   Not on file   Social Determinants of Health   Financial Resource Strain: Not on file  Food Insecurity: Not on file  Transportation Needs: Not on file  Physical Activity: Not on file  Stress: Not on file  Social Connections: Not on file  Intimate Partner Violence: Not on file    Family History:    Family History  Problem Relation Age of Onset   Heart disease Mother        deceased   Heart disease Father        deceased, heart disease   Diabetes Brother    Heart disease Brother    Thyroid disease Brother    Heart disease Sister    Heart disease Brother    Thyroid disease Brother    Lupus Daughter    Colon cancer Neg Hx      ROS:  Please see the history of present illness.   All other ROS reviewed and negative.     Physical Exam/Data:   Vitals:   05/09/22 2020 05/10/22 0019 05/10/22 0355 05/10/22 0758  BP: 126/85 (!) 140/88 (!) 135/95 (!) 156/94  Pulse: (!) 112 (!) 104 (!) 113 (!) 113  Resp: '16 16 18 18  '$ Temp: 98.1 F  (36.7 C) 97.8 F (36.6 C) 98 F (36.7 C) (!) 97.5 F (36.4 C)  TempSrc: Oral Oral Oral Oral  SpO2: 94% 96% 97% 99%  Weight:      Height:        Intake/Output Summary (Last 24 hours) at 05/10/2022 0948 Last data filed at 05/10/2022 0500 Gross per 24 hour  Intake 534.2 ml  Output --  Net 534.2 ml      05/09/2022    5:08 AM 05/07/2022   12:48 PM 03/17/2022   10:45 AM  Last 3 Weights  Weight (lbs) 134 lb 7.7 oz 134 lb 8 oz 133 lb 3.2 oz  Weight (kg) 61 kg 61.009 kg 60.419 kg     Body mass index is 23.82 kg/m.  General:  Well nourished, well developed, in no acute distress HEENT: normal Neck: no JVD Vascular: No carotid bruits; Distal pulses 2+ bilaterally Cardiac:  normal S1, S2; RRR; no murmur.  Tachycardic Lungs:  clear to auscultation bilaterally, no wheezing, rhonchi or rales  Abd: soft, nontender, no hepatomegaly  Ext: no edema Musculoskeletal:  No deformities, BUE and BLE strength normal and equal Skin: warm and dry  Neuro:  CNs 2-12 intact, no focal abnormalities noted Psych:  Normal affect   EKG:  The EKG was personally reviewed and demonstrates: Atrial flutter Telemetry:  Telemetry was personally reviewed and demonstrates: Atrial flutter with ventricular rate about 110 bpm on average  Relevant CV Studies: Echo pending  Last remote interrogation shows a burden of atrial arrhythmia of 0.3%  Laboratory Data:  High Sensitivity Troponin:   Recent Labs  Lab 05/09/22 0534 05/09/22 0652  TROPONINIHS 20* 24*     Chemistry Recent Labs  Lab 05/09/22 0534 05/10/22 0158  NA 142 142  K 2.9* 3.7  CL 113* 110  CO2 24 23  GLUCOSE 114* 123*  BUN 14 17  CREATININE 1.09* 1.35*  CALCIUM 8.0* 8.8*  MG  --  1.8  GFRNONAA 53* 41*  ANIONGAP 5 9    No results for input(s): "PROT", "ALBUMIN", "AST", "ALT", "ALKPHOS", "BILITOT" in the last 168 hours. Lipids  Recent Labs  Lab 05/10/22 0158  CHOL 96  TRIG 156*  HDL 30*  LDLCALC 35  CHOLHDL 3.2     Hematology Recent Labs  Lab 05/09/22 0534 05/10/22 0158  WBC 8.3 9.9  RBC 4.41 4.27  HGB 13.4 13.3  HCT 41.9 39.7  MCV 95.0 93.0  MCH 30.4 31.1  MCHC 32.0 33.5  RDW 15.5 15.6*  PLT 238 220   Thyroid No results for input(s): "TSH", "FREET4" in the last 168 hours.  BNPNo results for input(s): "BNP", "PROBNP" in the last 168 hours.  DDimer No results for input(s): "DDIMER" in the last 168 hours.   Radiology/Studies:  DG Chest Portable 1 View  Result Date: 05/09/2022 CLINICAL DATA:  Chest pain starting at 4 a.m. EXAM: PORTABLE CHEST 1 VIEW COMPARISON:  05/07/2022 FINDINGS: Normal heart size and mediastinal contours. Dual-chamber pacer leads from the left. There is no edema, consolidation, effusion, or pneumothorax. IMPRESSION: No acute finding, stable from prior. Electronically Signed   By: Jorje Guild M.D.   On: 05/09/2022 05:57   DG Ribs Unilateral W/Chest Left  Result Date: 05/07/2022 CLINICAL DATA:  Recent fall, left chest pain EXAM: LEFT RIBS AND CHEST - 3+ VIEW COMPARISON:  10/24/2019 FINDINGS: Cardiac size is within normal limits. Pacemaker battery is seen in left infraclavicular region. Surgical clips are seen in right upper quadrant. There are no signs of pulmonary edema or focal pulmonary consolidation. There is interval clearing of pulmonary vascular congestion and pulmonary edema. There is no pleural effusion or pneumothorax. No displaced fractures are seen in left ribs. IMPRESSION: No displaced fractures are seen in left ribs. There are no signs of pulmonary edema or focal pulmonary consolidation. There is no pleural effusion or pneumothorax. Electronically  Signed   By: Elmer Picker M.D.   On: 05/07/2022 15:13     Assessment and Plan:   #Atrial fibrillation/flutter Poor control on amiodarone.  Also experiencing tremors while on amiodarone.  Ablation is scheduled for February.  Unfortunately, given amiodarone's use, would not be to transition to an alternative  antiarrhythmic drug given the prolonged washout required for amiodarone.  I will discuss her case with Dr. Curt Bears to see if there is any availability for a sooner ablation appointment.  For now, recommend continuing low-dose amiodarone. Cont coreg  Pending the results of the heart catheterization on Monday, favor cardioversion prior to discharge.  #Chest pain In the setting of coronary artery disease a left heart catheterization is planned by Dr. Johnsie Cancel.  Keep n.p.o. after midnight.  For questions or updates, please contact [place] Please consult [email or web address] for contact info under    Signed, Vickie Epley, MD  05/10/2022 9:48 AM

## 2022-05-11 ENCOUNTER — Inpatient Hospital Stay (HOSPITAL_COMMUNITY)
Admission: EM | Disposition: A | Discharge status: Home / Self Care | Payer: Self-pay | Source: Home / Self Care | Attending: Family Medicine

## 2022-05-11 ENCOUNTER — Telehealth: Payer: Self-pay | Admitting: Family Medicine

## 2022-05-11 DIAGNOSIS — I5020 Unspecified systolic (congestive) heart failure: Secondary | ICD-10-CM | POA: Diagnosis not present

## 2022-05-11 DIAGNOSIS — N1832 Chronic kidney disease, stage 3b: Secondary | ICD-10-CM | POA: Diagnosis not present

## 2022-05-11 DIAGNOSIS — I25118 Atherosclerotic heart disease of native coronary artery with other forms of angina pectoris: Secondary | ICD-10-CM | POA: Diagnosis not present

## 2022-05-11 DIAGNOSIS — I480 Paroxysmal atrial fibrillation: Secondary | ICD-10-CM | POA: Diagnosis not present

## 2022-05-11 DIAGNOSIS — I2089 Other forms of angina pectoris: Secondary | ICD-10-CM | POA: Diagnosis not present

## 2022-05-11 HISTORY — PX: LEFT HEART CATH AND CORONARY ANGIOGRAPHY: CATH118249

## 2022-05-11 LAB — CBC
HCT: 39 % (ref 36.0–46.0)
Hemoglobin: 12.4 g/dL (ref 12.0–15.0)
MCH: 30.2 pg (ref 26.0–34.0)
MCHC: 31.8 g/dL (ref 30.0–36.0)
MCV: 95.1 fL (ref 80.0–100.0)
Platelets: 199 10*3/uL (ref 150–400)
RBC: 4.1 MIL/uL (ref 3.87–5.11)
RDW: 15.1 % (ref 11.5–15.5)
WBC: 11.5 10*3/uL — ABNORMAL HIGH (ref 4.0–10.5)
nRBC: 0 % (ref 0.0–0.2)

## 2022-05-11 LAB — GLUCOSE, CAPILLARY
Glucose-Capillary: 140 mg/dL — ABNORMAL HIGH (ref 70–99)
Glucose-Capillary: 120 mg/dL — ABNORMAL HIGH (ref 70–99)
Glucose-Capillary: 162 mg/dL — ABNORMAL HIGH (ref 70–99)
Glucose-Capillary: 173 mg/dL — ABNORMAL HIGH (ref 70–99)

## 2022-05-11 LAB — BASIC METABOLIC PANEL
Anion gap: 8 (ref 5–15)
BUN: 20 mg/dL (ref 8–23)
CO2: 24 mmol/L (ref 22–32)
Calcium: 8.6 mg/dL — ABNORMAL LOW (ref 8.9–10.3)
Chloride: 107 mmol/L (ref 98–111)
Creatinine, Ser: 1.51 mg/dL — ABNORMAL HIGH (ref 0.44–1.00)
GFR, Estimated: 36 mL/min — ABNORMAL LOW (ref >60)
Glucose, Bld: 186 mg/dL — ABNORMAL HIGH (ref 70–99)
Potassium: 3.7 mmol/L (ref 3.5–5.1)
Sodium: 139 mmol/L (ref 135–145)

## 2022-05-11 LAB — APTT: aPTT: 74 seconds — ABNORMAL HIGH (ref 24–36)

## 2022-05-11 LAB — HEPARIN LEVEL (UNFRACTIONATED): Heparin Unfractionated: 0.68 IU/mL (ref 0.30–0.70)

## 2022-05-11 SURGERY — LEFT HEART CATH AND CORONARY ANGIOGRAPHY
Anesthesia: LOCAL

## 2022-05-11 MED ORDER — SODIUM CHLORIDE 0.9 % IV SOLN: INTRAVENOUS | Status: AC

## 2022-05-11 MED ORDER — HEPARIN (PORCINE) 25000 UT/250ML-% IV SOLN: 850.0000 [IU]/h | INTRAVENOUS | Status: DC

## 2022-05-11 MED ORDER — APIXABAN 5 MG PO TABS
5.0000 mg | ORAL_TABLET | Freq: Two times a day (BID) | ORAL | Status: DC
  Administered 2022-05-11 – 2022-05-12 (×2): 5 mg via ORAL
  Filled 2022-05-11 (×2): qty 1

## 2022-05-11 MED ORDER — VERAPAMIL HCL 2.5 MG/ML IV SOLN
INTRAVENOUS | Status: AC
  Filled 2022-05-11: qty 2

## 2022-05-11 MED ORDER — MIDAZOLAM HCL 2 MG/2ML IJ SOLN
INTRAMUSCULAR | Status: AC
  Filled 2022-05-11: qty 2

## 2022-05-11 MED ORDER — LIDOCAINE HCL (PF) 1 % IJ SOLN
INTRAMUSCULAR | Status: DC | PRN
  Administered 2022-05-11: 5 mL via INTRADERMAL
  Administered 2022-05-11: 2 mL via INTRADERMAL

## 2022-05-11 MED ORDER — FENTANYL CITRATE (PF) 100 MCG/2ML IJ SOLN
INTRAMUSCULAR | Status: DC | PRN
  Administered 2022-05-11 (×2): 25 ug via INTRAVENOUS

## 2022-05-11 MED ORDER — SODIUM CHLORIDE 0.9 % IV SOLN: 250.0000 mL | INTRAVENOUS | Status: DC | PRN

## 2022-05-11 MED ORDER — ONDANSETRON HCL 4 MG/2ML IJ SOLN: 4.0000 mg | Freq: Four times a day (QID) | INTRAMUSCULAR | Status: DC | PRN

## 2022-05-11 MED ORDER — SODIUM CHLORIDE 0.9% FLUSH
3.0000 mL | Freq: Two times a day (BID) | INTRAVENOUS | Status: DC
  Administered 2022-05-11 – 2022-05-12 (×2): 3 mL via INTRAVENOUS

## 2022-05-11 MED ORDER — HEPARIN (PORCINE) IN NACL 1000-0.9 UT/500ML-% IV SOLN
INTRAVENOUS | Status: DC | PRN
  Administered 2022-05-11 (×2): 500 mL

## 2022-05-11 MED ORDER — IOHEXOL 350 MG/ML SOLN
INTRAVENOUS | Status: DC | PRN
  Administered 2022-05-11: 50 mL

## 2022-05-11 MED ORDER — SODIUM CHLORIDE 0.9% FLUSH: 3.0000 mL | INTRAVENOUS | Status: DC | PRN

## 2022-05-11 MED ORDER — HEPARIN (PORCINE) IN NACL 1000-0.9 UT/500ML-% IV SOLN
INTRAVENOUS | Status: AC
  Filled 2022-05-11: qty 1000

## 2022-05-11 MED ORDER — MIDAZOLAM HCL 2 MG/2ML IJ SOLN
INTRAMUSCULAR | Status: DC | PRN
  Administered 2022-05-11 (×2): 1 mg via INTRAVENOUS

## 2022-05-11 MED ORDER — LIDOCAINE HCL (PF) 1 % IJ SOLN
INTRAMUSCULAR | Status: AC
  Filled 2022-05-11: qty 30

## 2022-05-11 MED ORDER — LABETALOL HCL 5 MG/ML IV SOLN: 10.0000 mg | INTRAVENOUS | Status: AC | PRN

## 2022-05-11 MED ORDER — HEPARIN SODIUM (PORCINE) 1000 UNIT/ML IJ SOLN
INTRAMUSCULAR | Status: AC
  Filled 2022-05-11: qty 10

## 2022-05-11 MED ORDER — FENTANYL CITRATE (PF) 100 MCG/2ML IJ SOLN
INTRAMUSCULAR | Status: AC
  Filled 2022-05-11: qty 2

## 2022-05-11 MED ORDER — HYDRALAZINE HCL 20 MG/ML IJ SOLN: 10.0000 mg | INTRAMUSCULAR | Status: AC | PRN

## 2022-05-11 SURGICAL SUPPLY — 16 items
CATH INFINITI 5 FR AR1 MOD (CATHETERS) IMPLANT
CATH INFINITI 5FR AL1 (CATHETERS) IMPLANT
CATH INFINITI 5FR MULTPACK ANG (CATHETERS) IMPLANT
CLOSURE MYNX CONTROL 5F (Vascular Products) IMPLANT
GLIDESHEATH SLEND SS 6F .021 (SHEATH) IMPLANT
GUIDEWIRE INQWIRE 1.5J.035X260 (WIRE) IMPLANT
INQWIRE 1.5J .035X260CM (WIRE) ×1
KIT HEART LEFT (KITS) ×2 IMPLANT
KIT MICROPUNCTURE NIT STIFF (SHEATH) IMPLANT
PACK CARDIAC CATHETERIZATION (CUSTOM PROCEDURE TRAY) ×2 IMPLANT
SHEATH PINNACLE 5F 10CM (SHEATH) IMPLANT
SHEATH PROBE COVER 6X72 (BAG) IMPLANT
SYR MEDRAD MARK 7 150ML (SYRINGE) ×2 IMPLANT
TRANSDUCER W/STOPCOCK (MISCELLANEOUS) ×2 IMPLANT
TUBING CIL FLEX 10 FLL-RA (TUBING) ×2 IMPLANT
WIRE EMERALD 3MM-J .035X150CM (WIRE) IMPLANT

## 2022-05-11 NOTE — Progress Notes (Addendum)
Rounding Note    Patient Name: Virginia Huber Date of Encounter: 05/11/2022  [facility] Cardiologist: Rozann Lesches, MD   Subjective   Very aware of her Afib, palpitations, CP with it  Inpatient Medications    Scheduled Meds:  amiodarone  100 mg Oral Daily   atorvastatin  80 mg Oral Daily   calcitRIOL  0.25 mcg Oral Once per day on Mon Wed Fri   carvedilol  12.5 mg Oral BID   cholecalciferol  2,000 Units Oral QPM   clopidogrel  75 mg Oral Daily   insulin aspart  0-5 Units Subcutaneous QHS   insulin aspart  0-9 Units Subcutaneous TID WC   irbesartan  37.5 mg Oral Daily   levothyroxine  112 mcg Oral QAC breakfast   rOPINIRole  0.5 mg Oral QHS   sodium chloride flush  3 mL Intravenous Q12H   Continuous Infusions:  sodium chloride     sodium chloride 1 mL/kg/hr (05/11/22 0112)   heparin 850 Units/hr (05/10/22 2211)   PRN Meds: sodium chloride, acetaminophen **OR** acetaminophen, ondansetron **OR** ondansetron (ZOFRAN) IV, sodium chloride flush   Vital Signs    Vitals:   05/10/22 2101 05/11/22 0012 05/11/22 0442 05/11/22 0809  BP: (!) 153/92 (!) 140/85 (!) 145/90 (!) 153/90  Pulse: (!) 112 (!) 106 (!) 116 (!) 116  Resp: '16 18 18 18  '$ Temp: 97.8 F (36.6 C) 97.9 F (36.6 C) 98.6 F (37 C) 98.1 F (36.7 C)  TempSrc: Oral Oral Oral Oral  SpO2: 98% 96% 99% 98%  Weight:      Height:        Intake/Output Summary (Last 24 hours) at 05/11/2022 1022 Last data filed at 05/10/2022 2200 Gross per 24 hour  Intake 143.92 ml  Output --  Net 143.92 ml      05/09/2022    5:08 AM 05/07/2022   12:48 PM 03/17/2022   10:45 AM  Last 3 Weights  Weight (lbs) 134 lb 7.7 oz 134 lb 8 oz 133 lb 3.2 oz  Weight (kg) 61 kg 61.009 kg 60.419 kg      Telemetry    AFib 90's-110's - Personally Reviewed  ECG    No new EKGs - Personally Reviewed  Physical Exam   GEN: No acute distress.   Neck: No JVD Cardiac: irreg-irreg, tachycardic,, no murmurs, rubs, or  gallops.  Respiratory: CTA b/l. GI: Soft, nontender, non-distended  MS: No edema; No deformity. Neuro:  Nonfocal  Psych: Normal affect   Labs    High Sensitivity Troponin:   Recent Labs  Lab 05/09/22 0534 05/09/22 0652  TROPONINIHS 20* 24*     Chemistry Recent Labs  Lab 05/09/22 0534 05/10/22 0158 05/11/22 0211  NA 142 142 139  K 2.9* 3.7 3.7  CL 113* 110 107  CO2 '24 23 24  '$ GLUCOSE 114* 123* 186*  BUN '14 17 20  '$ CREATININE 1.09* 1.35* 1.51*  CALCIUM 8.0* 8.8* 8.6*  MG  --  1.8  --   GFRNONAA 53* 41* 36*  ANIONGAP '5 9 8    '$ Lipids  Recent Labs  Lab 05/10/22 0158  CHOL 96  TRIG 156*  HDL 30*  LDLCALC 35  CHOLHDL 3.2    Hematology Recent Labs  Lab 05/09/22 0534 05/10/22 0158 05/11/22 0211  WBC 8.3 9.9 11.5*  RBC 4.41 4.27 4.10  HGB 13.4 13.3 12.4  HCT 41.9 39.7 39.0  MCV 95.0 93.0 95.1  MCH 30.4 31.1 30.2  MCHC 32.0 33.5  31.8  RDW 15.5 15.6* 15.1  PLT 238 220 199   Thyroid No results for input(s): "TSH", "FREET4" in the last 168 hours.  BNPNo results for input(s): "BNP", "PROBNP" in the last 168 hours.  DDimer No results for input(s): "DDIMER" in the last 168 hours.   Radiology      Cardiac Studies    05/10/2022: TTE 1. Severe hypokinesis of the anteroseptal wall with overall mild LV  dysfunction.   2. Left ventricular ejection fraction, by estimation, is 40 to 45%. The  left ventricle has mildly decreased function. The left ventricle  demonstrates regional wall motion abnormalities (see scoring  diagram/findings for description). There is severe  concentric left ventricular hypertrophy. Left ventricular diastolic  parameters are indeterminate.   3. Right ventricular systolic function is normal. The right ventricular  size is normal.   4. The mitral valve is normal in structure. Trivial mitral valve  regurgitation. No evidence of mitral stenosis.   5. The aortic valve is tricuspid. Aortic valve regurgitation is not  visualized. Aortic  valve sclerosis is present, with no evidence of aortic  valve stenosis.   6. The inferior vena cava is normal in size with greater than 50%  respiratory variability, suggesting right atrial pressure of 3 mmHg.   Lexiscan Myoview 01/20/2018: Downsloping ST segment depression ST segment depression of 0.5 mm was noted during stress in the II, III, aVF, V5 and V6 leads. Defect 1: There is a small defect of mild severity present in the apical anterior and apical septal location. This likely represents soft tissue attenuation. A small degree of scar cannot entirely be excluded. No signficant ischemia. This is a low risk study. Nuclear stress EF: 64%.   Echocardiogram 08/26/2019:  1. Left ventricular ejection fraction, by estimation, is 55 to 60%. The  left ventricle has normal function. The left ventricle has no regional  wall motion abnormalities. There is moderate concentric left ventricular  hypertrophy. Left ventricular  diastolic function could not be evaluated. Left ventricular diastolic  function could not be evaluated. Elevated left ventricular end-diastolic  pressure.   2. Right ventricular systolic function is normal. The right ventricular  size is normal. There is mildly elevated pulmonary artery systolic  pressure.   3. Left atrial size was severely dilated.   4. The mitral valve is normal in structure and function. Trivial mitral  valve regurgitation. No evidence of mitral stenosis.   5. The aortic valve is tricuspid. Aortic valve regurgitation is not  visualized. No aortic stenosis is present.   6. The inferior vena cava is normal in size with <50% respiratory  variability, suggesting right atrial pressure of 8 mmHg.  Patient Profile     76 y.o. female w/PMHx of CAD (prior PCI), tachy-brady w/PPM, Afib, sleep apnea (mild), HTN, HLD, CKD (IIIb), hypothyroidism, TIA transferred from [facility] with c/o CP, also found in Afib with fast rates.   Device information MDT dual chamber PPM  implanted 06/25/2016  Assessment & Plan    Paroxysmal AFib CHA2DS2Vasc is at least 8, on Eliquis at home Rates not ideal, continue to advance nodal blocker as able (historically held 2/2 reports of dizziness) Recommend DCCV prior to discharge (+/- TEE depending if she has any interruption in her a/c post cath) Unable to tolerate more amio with medication associated tremor   She reports excellent Eliquis compliance at home I Virginia Huber send a message to Dr. Macky Lower RN to keep her on the wait list for any earlier openings on AFib  ablation schedule   CAD CP LVEF down some with new WMA Planned for cath today C/w attending cardiology team    For questions or updates, please contact Virginia Huber Please consult [email or web address] for contact info under        Signed, [place], PA-C  05/11/2022, 10:22 AM    I have seen and examined this patient with Virginia Huber.  Agree with above, note added to reflect my findings.  Patient remains in atrial fibrillation.  Feeling well.  No further episodes of chest pain.  GEN: Well nourished, well developed, in no acute distress  HEENT: normal  Neck: no JVD, carotid bruits, or masses Cardiac: Tachycardic, irregular; no murmurs, rubs, or gallops,no edema  Respiratory:  clear to auscultation bilaterally, normal work of breathing GI: soft, nontender, nondistended, + BS MS: no deformity or atrophy  Skin: warm and dry Neuro:  Strength and sensation are intact Psych: euthymic mood, full affect   Paroxysmal atrial fibrillation/atrial flutter: Has plans for ablation.  Unfortunately she has had tremors due to amiodarone.  We Virginia Huber continue amiodarone for now until we can get her ablated.  She would likely need cardioversion after left heart catheterization. Chest pain: Troponins not significantly elevated.  Potentially due to atrial fibrillation with rapid rates.  Plan for left heart cath station today.  Would restart anticoagulation as soon as  possible post left heart catheterization. New systolic heart failure: Ejection fraction 40 to 45%.  Continue evaluation post left heart catheterization.  Merritt Kibby M. Coumba Kellison MD 05/11/2022 2:17 PM

## 2022-05-11 NOTE — Telephone Encounter (Signed)
HTA pending faxed notes 

## 2022-05-11 NOTE — Progress Notes (Signed)
[facility] for Heparin Indication: chest pain/ACS, afib  Allergies  Allergen Reactions   Penicillins Hives    Has patient had a PCN reaction causing immediate rash, facial/tongue/throat swelling, SOB or lightheadedness with hypotension: Yes Has patient had a PCN reaction causing severe rash involving mucus membranes or skin necrosis: No Has patient had a PCN reaction that required hospitalization No Has patient had a PCN reaction occurring within the last 10 years: No If all of the above answers are "NO", then may proceed with Cephalosporin use.   Percocet [Oxycodone-Acetaminophen] Nausea And Vomiting    Patient Measurements: Height: '5\' 3"'$  (160 cm) Weight: 61 kg (134 lb 7.7 oz) IBW/kg (Calculated) : 52.4   Vital Signs: Temp: 97.8 F (36.6 C) (11/20 1427) Temp Source: Oral (11/20 1427) BP: 131/78 (11/20 1427) Pulse Rate: 117 (11/20 1427)  Labs: Recent Labs    05/09/22 0534 05/09/22 0652 05/09/22 2319 05/10/22 0158 05/10/22 1047 05/11/22 0211  HGB 13.4  --   --  13.3  --  12.4  HCT 41.9  --   --  39.7  --  39.0  PLT 238  --   --  220  --  199  APTT  --   --    < > 44* 66* 74*  HEPARINUNFRC  --   --   --  >1.10* >1.10* 0.68  CREATININE 1.09*  --   --  1.35*  --  1.51*  TROPONINIHS 20* 24*  --   --   --   --    < > = values in this interval not displayed.     Estimated Creatinine Clearance: 26.2 mL/min (A) (by C-G formula based on SCr of 1.51 mg/dL (H)).   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 h/o PAF/flutter during admission in 2013 for PNA. b. PAF during adm for NSTEMI 07/2015, subsequent paroxysms  since then.   B12 deficiency anemia    Branch retinal vein occlusion with macular edema of right eye 10/11/2019   Chronic renal failure, stage 3b ([place]) 09/23/2017   Coronary artery disease 11/30/2014   a. remote MI. b. h/o PTCA with scoring balloon to OM1 11/2014. c. NSTEMI 03/2015 s/p DES to prox-mid Cx. d. NSTEMI 07/2015 s/p scoring balloon/PTCA/DES to dRCA with PAF during that admission   Cutaneous lupus erythematosus    Early stage nonexudative age-related macular degeneration of right eye 02/27/2020   Essential hypertension    GERD (gastroesophageal reflux disease)    History of blood transfusion 1980's   2nd surgical procedures   Hypercholesteremia    Hypothyroidism    Myocardial infarction ([place]) 02/2012   NSTEMI (non-ST elevated myocardial infarction) ([place]) 04/02/2015   OSA (obstructive sleep apnea) 05/13/2016   Ovarian tumor    PAD (peripheral artery disease) ([place])    a. s/p LE angio 2015; followed by Dr. Fletcher Anon - managed medically.   Pericardial effusion    a. 06/2016 after ppm - s/p pericardiocentesis.   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 s/p [employer] PPM 06/2016, c/b lead perf/pericardial effusion.   TIA (transient ischemic attack)    Type 2 diabetes with nephropathy ([place]) 02/29/2012   Assessment: 75yof Hx CAD admitted with chest pain. On apixaban PTA last dose 11/17 pm. Pharmacy consulted for heparin infusion.   aPTT is at goal this AM at 74 sec. Heparin level elevated 0.68, does not appear to be correlated yet. Hgb 13.3>12.4, platelets are WNL. No issues with infusion reported, no signs/symptoms of bleeding noted. Given recent apixaban use, will  monitor heparin aPTT levels until correlating.   Now s/p cath, plan on medical management, orders to resume anticoagulation post cath.   Goal of Therapy:  Heparin level 0.3-0.7 units/ml aPTT 66-102 seconds Monitor platelets by anticoagulation protocol: Yes   Plan:  Restart heparin infusion at 850 units/hour tonight Monitor daily heparin and aPTT levels until correlating Monitor daily CBC and signs/symptoms of bleeding  Erin Hearing PharmD., BCPS Clinical Pharmacist 05/11/2022 3:05 PM

## 2022-05-11 NOTE — Progress Notes (Signed)
PROGRESS NOTE    Virginia Huber  SEG:[id number] DOB: 11/23/1945 DOA: 05/09/2022 PCP: Sharilyn Sites, MD  Chief Complaint  Patient presents with   Chest Pain    Brief Narrative:  Virginia Huber is Virginia Huber 76 year old female with Virginia Huber history of diabetes mellitus type 2, hypertension, hyperlipidemia, CKD stage III, paroxysmal atrial fibrillation, HFpEF, coronary disease status post angioplasty of the OM1 in 2016, DES to the circumflex in 2016, and DES to the RCA in 2017 who presented to the ED at [facility] 11/18 with substernal chest pain that woke her up in the early morning. The patient has some shortness of breath and diaphoresis.  She denies any dizziness or syncope.  She denies any nausea, vomiting.  She has not had any fevers, chills, coughing, hemoptysis.  Notably, the patient had Virginia Huber mechanical fall about 5 days prior to this admission.  She went to urgent care on 05/07/2022 because of left-sided rib pain.  X-rays of her ribs at that time were negative for any fractures.  She continues to have pleuritic rib pain on the left side.   In the ED, the patient was afebrile and hemodynamically stable with oxygen saturation 99% room air.  Heart rate was mildly elevated 105-110.  WBC 8.3, hemoglobin 13.4, platelets 230,000.  Sodium 142, potassium 2.9, bicarbonate 24, serum creatinine 1.09.  Troponin 20>>24.  EKG showed sinus tachycardia with IVCD.  Chest x-ray was negative for any infiltrates or edema.  Cardiology was consulted, Dr. Stanford Breed who recommended transfer to [facility] for evaluation. Notably, the patient was given nitroglycerin x1 in the ED with relief of her chest discomfort.  Her chest discomfort subsequently returned and she was given Virginia Huber second nitroglycerin with relief once again.   Cardiac monitoring has revealed atypical atrial flutter for which EP was consulted. She has an ablation scheduled in February 2024. EP was consulted, recommends continuing amiodarone, will see if ablation procedure can be  moved sooner.   Cardiac catheterization is planned 11/20.    Assessment & Plan:   Principal Problem:   Angina at rest Active Problems:   Mixed hyperlipidemia   Essential hypertension   Hypothyroidism   Type 2 diabetes with nephropathy (HCC)   Hypokalemia   Tachy-brady syndrome (HCC)   Chronic renal failure, stage 3b (HCC)   Paroxysmal atrial fibrillation (HCC)   Chronic heart failure with preserved ejection fraction (HFpEF) (HCC)  Angina in patient with CAD:  - Continue plavix, statin, coreg.  - Echo pending - prn NTG - Appreciate cardiology recommendations, s/p LHC - see report -> recommending medical management of CAD, consider adding imdur or ranxea for angina in setting of diffuse small vessel disease   Paroxysmal atrial fibrillation, atrial flutter:  - EP consulted, continue beta blocker and amiodarone, with half life unable to transition to alternative antiarrhythmic at this time.  - Continue heparin gtt  - Depending on cath results, EP favors DCCV prior to discharge.    HFmrEF: Appears euvolemic - Continue coreg - echo with EF 40-45%, regional wall motion abnormalities (severe hypokinesis of anteroseptal wall)   AKI on Stage IIIb CKD: Baselin creatinine 1.1-1.4, remains within that range. - Avoid nephrotoxins as able, will need contrast with cath.  - creatinine worsened today, will follow after catheterization/contrast - she's also receiving arb, monitor - follow UA, additional w/u prn - strict I/O, daily weights   Tachy-brady syndrome (Virginia Huber) S/p PPM 06/2016 Follows Dr. Curt Bears   Hypokalemia: Resolved with supplementation.   T2DM: 02/09/22 A1C--6.0 - Start  sensitive SSI   Hypothyroidism: TSH 0.571 - Continue levothyroxine   Essential hypertension - Continue olmesartan and carvedilol   Mixed hyperlipidemia - Continue atorvastatin    DVT prophylaxis: heparin gtt Code Status: full Family Communication: husband at bedside Disposition:   Status is:  Inpatient Remains inpatient appropriate because: continued need for inpatient care   Consultants:  Cardiology  Procedures:  LHC   Prox Cx to Mid Cx lesion is 10% stenosed.   1st Mrg lesion is 100% stenosed.   Ost LAD to Mid LAD lesion is 30% stenosed.   Mid LAD to Dist LAD lesion is 30% stenosed.   Ost RCA to Prox RCA lesion is 40% stenosed.   RPAV-1 lesion is 20% stenosed.   RPAV-2 lesion is 70% stenosed.   2nd RPL lesion is 70% stenosed.   The LAD is Virginia Huber moderate caliber vessel that courses to the apex. There appears to be diffuse mild to moderate disease in the entire LAD without Virginia Huber focal stenosis.  2. The Circumflex has Virginia Huber patent proximal stented segment. This vessel terminates into an obtuse marginal branch. The small caliber first obtuse marginal branch is chronically occluded.  3. The RCA is Virginia Huber large caliber dominant vessel with Virginia Huber patent stent in the posterolateral artery. The small caliber distal posterolateral artery beyond the stented segment has moderately severe disease at Marcell Chavarin bifurcation but the vessels appear too small for PCI.  4. Normal LVEDP   Recommendations: Medical management of CAD. Consider adding Imdur or Ranexa for angina in setting of diffuse small vessel disease.   Echo IMPRESSIONS     1. Severe hypokinesis of the anteroseptal wall with overall mild LV  dysfunction.   2. Left ventricular ejection fraction, by estimation, is 40 to 45%. The  left ventricle has mildly decreased function. The left ventricle  demonstrates regional wall motion abnormalities (see scoring  diagram/findings for description). There is severe  concentric left ventricular hypertrophy. Left ventricular diastolic  parameters are indeterminate.   3. Right ventricular systolic function is normal. The right ventricular  size is normal.   4. The mitral valve is normal in structure. Trivial mitral valve  regurgitation. No evidence of mitral stenosis.   5. The aortic valve is tricuspid. Aortic  valve regurgitation is not  visualized. Aortic valve sclerosis is present, with no evidence of aortic  valve stenosis.   6. The inferior vena cava is normal in size with greater than 50%  respiratory variability, suggesting right atrial pressure of 3 mmHg.   Antimicrobials:  Anti-infectives (From admission, onward)    None       Subjective: No CP Denies significant SOB  Objective: Vitals:   05/11/22 1403 05/11/22 1408 05/11/22 1413 05/11/22 1427  BP: 118/77 126/81 119/68 131/78  Pulse: (!) 117 (!) 117 (!) 111 (!) 117  Resp: (!) 24 (!) 28 (!) 21 20  Temp:    97.8 F (36.6 C)  TempSrc:    Oral  SpO2: 94% 94% 94% 94%  Weight:      Height:        Intake/Output Summary (Last 24 hours) at 05/11/2022 1430 Last data filed at 05/10/2022 2200 Gross per 24 hour  Intake 143.92 ml  Output --  Net 143.92 ml   Filed Weights   05/09/22 0508  Weight: 61 kg    Examination:  General exam: Appears calm and comfortable  Respiratory system: unlabored Cardiovascular system: tachycardic Gastrointestinal system: Abdomen is nondistended, soft and nontender.  Central nervous system: Alert and  oriented. No focal neurological deficits. Extremities: no LEE   Data Reviewed: I have personally reviewed following labs and imaging studies  CBC: Recent Labs  Lab 05/09/22 0534 05/10/22 0158 05/11/22 0211  WBC 8.3 9.9 11.5*  HGB 13.4 13.3 12.4  HCT 41.9 39.7 39.0  MCV 95.0 93.0 95.1  PLT 238 220 604    Basic Metabolic Panel: Recent Labs  Lab 05/09/22 0534 05/10/22 0158 05/11/22 0211  NA 142 142 139  K 2.9* 3.7 3.7  CL 113* 110 107  CO2 '24 23 24  '$ GLUCOSE 114* 123* 186*  BUN '14 17 20  '$ CREATININE 1.09* 1.35* 1.51*  CALCIUM 8.0* 8.8* 8.6*  MG  --  1.8  --     GFR: Estimated Creatinine Clearance: 26.2 mL/min (Jac Romulus) (by C-G formula based on SCr of 1.51 mg/dL (H)).  Liver Function Tests: No results for input(s): "AST", "ALT", "ALKPHOS", "BILITOT", "PROT", "ALBUMIN" in the  last 168 hours.  CBG: Recent Labs  Lab 05/09/22 1422 05/10/22 1236 05/10/22 1619 05/10/22 2148 05/11/22 0813  GLUCAP 91 190* 125* 170* 140*     No results found for this or any previous visit (from the past 240 hour(s)).       Radiology Studies: CARDIAC CATHETERIZATION  Result Date: 05/11/2022   Prox Cx to Mid Cx lesion is 10% stenosed.   1st Mrg lesion is 100% stenosed.   Ost LAD to Mid LAD lesion is 30% stenosed.   Mid LAD to Dist LAD lesion is 30% stenosed.   Ost RCA to Prox RCA lesion is 40% stenosed.   RPAV-1 lesion is 20% stenosed.   RPAV-2 lesion is 70% stenosed.   2nd RPL lesion is 70% stenosed. The LAD is Virginia Huber moderate caliber vessel that courses to the apex. There appears to be diffuse mild to moderate disease in the entire LAD without Virginia Huber focal stenosis. 2. The Circumflex has Samhita Kretsch patent proximal stented segment. This vessel terminates into an obtuse marginal branch. The small caliber first obtuse marginal branch is chronically occluded. 3. The RCA is Virginia Huber large caliber dominant vessel with Tiffny Gemmer patent stent in the posterolateral artery. The small caliber distal posterolateral artery beyond the stented segment has moderately severe disease at Virginia Huber bifurcation but the vessels appear too small for PCI. 4. Normal LVEDP Recommendations: Medical management of CAD. Consider adding Imdur or Ranexa for angina in setting of diffuse small vessel disease.   ECHOCARDIOGRAM COMPLETE  Result Date: 05/10/2022    ECHOCARDIOGRAM REPORT   Patient Name:   Virginia Huber Date of Exam: 05/10/2022 Medical Rec #:  [id number]       Height:       63.0 in Accession #:    [id number]      Weight:       134.5 lb Date of Birth:  18-Nov-1945      BSA:          1.633 m Patient Age:    34 years        BP:           156/94 mmHg Patient Gender: F               HR:           114 bpm. Exam Location:  Inpatient Procedure: 2D Echo, Cardiac Doppler and Color Doppler Indications:    Chest pain  History:        Patient has prior  history of Echocardiogram examinations, most  recent 11/26/2019. CHF, Previous Myocardial Infarction,                 Arrythmias:Atrial Fibrillation; Risk Factors:Hypertension,                 Diabetes and Dyslipidemia. GERD. Tachy-brady syndrome.  Sonographer:    Clayton Lefort RDCS (AE) Referring Phys: DAVID TAT IMPRESSIONS  1. Severe hypokinesis of the anteroseptal wall with overall mild LV dysfunction.  2. Left ventricular ejection fraction, by estimation, is 40 to 45%. The left ventricle has mildly decreased function. The left ventricle demonstrates regional wall motion abnormalities (see scoring diagram/findings for description). There is severe concentric left ventricular hypertrophy. Left ventricular diastolic parameters are indeterminate.  3. Right ventricular systolic function is normal. The right ventricular size is normal.  4. The mitral valve is normal in structure. Trivial mitral valve regurgitation. No evidence of mitral stenosis.  5. The aortic valve is tricuspid. Aortic valve regurgitation is not visualized. Aortic valve sclerosis is present, with no evidence of aortic valve stenosis.  6. The inferior vena cava is normal in size with greater than 50% respiratory variability, suggesting right atrial pressure of 3 mmHg. FINDINGS  Left Ventricle: Left ventricular ejection fraction, by estimation, is 40 to 45%. The left ventricle has mildly decreased function. The left ventricle demonstrates regional wall motion abnormalities. The left ventricular internal cavity size was normal in size. There is severe concentric left ventricular hypertrophy. Left ventricular diastolic parameters are indeterminate. Right Ventricle: The right ventricular size is normal. Right ventricular systolic function is normal. Left Atrium: Left atrial size was normal in size. Right Atrium: Right atrial size was normal in size. Pericardium: There is no evidence of pericardial effusion. Mitral Valve: The mitral valve is  normal in structure. Mild to moderate mitral annular calcification. Trivial mitral valve regurgitation. No evidence of mitral valve stenosis. Tricuspid Valve: The tricuspid valve is normal in structure. Tricuspid valve regurgitation is trivial. No evidence of tricuspid stenosis. Aortic Valve: The aortic valve is tricuspid. Aortic valve regurgitation is not visualized. Aortic valve sclerosis is present, with no evidence of aortic valve stenosis. Aortic valve mean gradient measures 2.0 mmHg. Aortic valve peak gradient measures 2.8  mmHg. Aortic valve area, by VTI measures 1.35 cm. Pulmonic Valve: The pulmonic valve was not well visualized. Pulmonic valve regurgitation is not visualized. No evidence of pulmonic stenosis. Aorta: The aortic root is normal in size and structure. Venous: The inferior vena cava is normal in size with greater than 50% respiratory variability, suggesting right atrial pressure of 3 mmHg. IAS/Shunts: No atrial level shunt detected by color flow Doppler. Additional Comments: Severe hypokinesis of the anteroseptal wall with overall mild LV dysfunction. Xan Ingraham device lead is visualized.  LEFT VENTRICLE PLAX 2D LVIDd:         3.80 cm LVIDs:         2.80 cm LV PW:         1.50 cm LV IVS:        1.50 cm LVOT diam:     1.80 cm LV SV:         22 LV SV Index:   13 LVOT Area:     2.54 cm  LV Volumes (MOD) LV vol d, MOD A2C: 66.1 ml LV vol d, MOD A4C: 70.7 ml LV vol s, MOD A2C: 46.1 ml LV vol s, MOD A4C: 47.7 ml LV SV MOD A2C:     20.0 ml LV SV MOD A4C:     70.7 ml  LV SV MOD BP:      23.1 ml RIGHT VENTRICLE            IVC RV Basal diam:  2.50 cm    IVC diam: 1.50 cm RV S prime:     9.25 cm/s TAPSE (M-mode): 1.4 cm LEFT ATRIUM             Index        RIGHT ATRIUM          Index LA diam:        4.50 cm 2.75 cm/m   RA Area:     9.51 cm LA Vol (A2C):   38.3 ml 23.45 ml/m  RA Volume:   17.90 ml 10.96 ml/m LA Vol (A4C):   40.6 ml 24.85 ml/m LA Biplane Vol: 41.8 ml 25.59 ml/m  AORTIC VALVE AV Area (Vmax):     1.93 cm AV Area (Vmean):   1.83 cm AV Area (VTI):     1.35 cm AV Vmax:           83.30 cm/s AV Vmean:          58.200 cm/s AV VTI:            0.160 m AV Peak Grad:      2.8 mmHg AV Mean Grad:      2.0 mmHg LVOT Vmax:         63.20 cm/s LVOT Vmean:        41.900 cm/s LVOT VTI:          0.085 m LVOT/AV VTI ratio: 0.53  AORTA Ao Root diam: 3.00 cm  SHUNTS Systemic VTI:  0.09 m Systemic Diam: 1.80 cm Kirk Ruths MD Electronically signed by Kirk Ruths MD Signature Date/Time: 05/10/2022/12:56:47 PM    Final         Scheduled Meds:  amiodarone  100 mg Oral Daily   atorvastatin  80 mg Oral Daily   calcitRIOL  0.25 mcg Oral Once per day on Mon Wed Fri   carvedilol  12.5 mg Oral BID   cholecalciferol  2,000 Units Oral QPM   clopidogrel  75 mg Oral Daily   insulin aspart  0-5 Units Subcutaneous QHS   insulin aspart  0-9 Units Subcutaneous TID WC   irbesartan  37.5 mg Oral Daily   levothyroxine  112 mcg Oral QAC breakfast   rOPINIRole  0.5 mg Oral QHS   sodium chloride flush  3 mL Intravenous Q12H   sodium chloride flush  3 mL Intravenous Q12H   Continuous Infusions:  sodium chloride     sodium chloride     heparin 850 Units/hr (05/10/22 2211)     LOS: 2 days    Time spent: over 30 min    Fayrene Helper, MD [facility]   To contact the attending provider between 7A-7P or the covering provider during after hours 7P-7A, please log into the web site [email or web address] and access using universal [place] password for that web site. If you do not have the password, please call the hospital operator.  05/11/2022, 2:30 PM

## 2022-05-11 NOTE — Interval H&P Note (Signed)
History and Physical Interval Note:  05/11/2022 1:06 PM  Virginia Huber  has presented today for surgery, with the diagnosis of unstable angina.  The various methods of treatment have been discussed with the patient and family. After consideration of risks, benefits and other options for treatment, the patient has consented to  Procedure(s): LEFT HEART CATH AND CORONARY ANGIOGRAPHY (N/A) as a surgical intervention.  The patient's history has been reviewed, patient examined, no change in status, stable for surgery.  I have reviewed the patient's chart and labs.  Questions were answered to the patient's satisfaction.    Cath Lab Visit (complete for each Cath Lab visit)  Clinical Evaluation Leading to the Procedure:   ACS: No.  Non-ACS:    Anginal Classification: CCS III  Anti-ischemic medical therapy: Minimal Therapy (1 class of medications)  Non-Invasive Test Results: No non-invasive testing performed  Prior CABG: No previous CABG        Virginia Huber

## 2022-05-11 NOTE — Care Management (Signed)
  Transition of Care [facility]) Screening Note   Patient Details  Name: KATHARINA JEHLE Date of Birth: 09/16/1945   [facility]) CM/SW Contact:    Bethena Roys, RN Phone Number: 05/11/2022, 3:51 PM    Transition of Care Department [facility]) has reviewed the patient and no TOC needs have been identified at this time. We will continue to monitor patient advancement through interdisciplinary progression rounds. If new patient transition needs arise, please place a TOC consult.

## 2022-05-11 NOTE — Progress Notes (Addendum)
Rounding Note    Patient Name: Virginia Huber Date of Encounter: 05/11/2022  [place] Cardiologist: Rozann Lesches, MD   Subjective   No further chest pain since admission. Discussed heart cath today  Inpatient Medications    Scheduled Meds:  amiodarone  100 mg Oral Daily   atorvastatin  80 mg Oral Daily   calcitRIOL  0.25 mcg Oral Once per day on Mon Wed Fri   carvedilol  12.5 mg Oral BID   cholecalciferol  2,000 Units Oral QPM   clopidogrel  75 mg Oral Daily   insulin aspart  0-5 Units Subcutaneous QHS   insulin aspart  0-9 Units Subcutaneous TID WC   irbesartan  37.5 mg Oral Daily   levothyroxine  112 mcg Oral QAC breakfast   rOPINIRole  0.5 mg Oral QHS   sodium chloride flush  3 mL Intravenous Q12H   Continuous Infusions:  sodium chloride     sodium chloride 1 mL/kg/hr (05/11/22 0112)   heparin 850 Units/hr (05/10/22 2211)   PRN Meds: sodium chloride, acetaminophen **OR** acetaminophen, ondansetron **OR** ondansetron (ZOFRAN) IV, sodium chloride flush   Vital Signs    Vitals:   05/10/22 2101 05/11/22 0012 05/11/22 0442 05/11/22 0809  BP: (!) 153/92 (!) 140/85 (!) 145/90 (!) 153/90  Pulse: (!) 112 (!) 106 (!) 116 (!) 116  Resp: '16 18 18 18  '$ Temp: 97.8 F (36.6 C) 97.9 F (36.6 C) 98.6 F (37 C) 98.1 F (36.7 C)  TempSrc: Oral Oral Oral Oral  SpO2: 98% 96% 99% 98%  Weight:      Height:        Intake/Output Summary (Last 24 hours) at 05/11/2022 1047 Last data filed at 05/10/2022 2200 Gross per 24 hour  Intake 143.92 ml  Output --  Net 143.92 ml      05/09/2022    5:08 AM 05/07/2022   12:48 PM 03/17/2022   10:45 AM  Last 3 Weights  Weight (lbs) 134 lb 7.7 oz 134 lb 8 oz 133 lb 3.2 oz  Weight (kg) 61 kg 61.009 kg 60.419 kg      Telemetry    Atrial flutter with VR 110s, variable conduction - Personally Reviewed  ECG    No new tracings - Personally Reviewed  Physical Exam   GEN: No acute distress.   Neck: No  JVD Cardiac: irregular rhythm, tachycardic rate, no murmur Respiratory: Clear to auscultation bilaterally. GI: Soft, nontender, non-distended  MS: No edema; No deformity. Neuro:  Nonfocal  Psych: Normal affect   Labs    High Sensitivity Troponin:   Recent Labs  Lab 05/09/22 0534 05/09/22 0652  TROPONINIHS 20* 24*     Chemistry Recent Labs  Lab 05/09/22 0534 05/10/22 0158 05/11/22 0211  NA 142 142 139  K 2.9* 3.7 3.7  CL 113* 110 107  CO2 '24 23 24  '$ GLUCOSE 114* 123* 186*  BUN '14 17 20  '$ CREATININE 1.09* 1.35* 1.51*  CALCIUM 8.0* 8.8* 8.6*  MG  --  1.8  --   GFRNONAA 53* 41* 36*  ANIONGAP '5 9 8    '$ Lipids  Recent Labs  Lab 05/10/22 0158  CHOL 96  TRIG 156*  HDL 30*  LDLCALC 35  CHOLHDL 3.2    Hematology Recent Labs  Lab 05/09/22 0534 05/10/22 0158 05/11/22 0211  WBC 8.3 9.9 11.5*  RBC 4.41 4.27 4.10  HGB 13.4 13.3 12.4  HCT 41.9 39.7 39.0  MCV 95.0 93.0 95.1  MCH 30.4  31.1 30.2  MCHC 32.0 33.5 31.8  RDW 15.5 15.6* 15.1  PLT 238 220 199   Thyroid No results for input(s): "TSH", "FREET4" in the last 168 hours.  BNPNo results for input(s): "BNP", "PROBNP" in the last 168 hours.  DDimer No results for input(s): "DDIMER" in the last 168 hours.   Radiology    ECHOCARDIOGRAM COMPLETE  Result Date: 05/10/2022    ECHOCARDIOGRAM REPORT   Patient Name:   Virginia Huber Date of Exam: 05/10/2022 Medical Rec #:  [id number]       Height:       63.0 in Accession #:    [id number]      Weight:       134.5 lb Date of Birth:  1945-11-20      BSA:          1.633 m Patient Age:    76 years        BP:           156/94 mmHg Patient Gender: F               HR:           114 bpm. Exam Location:  Inpatient Procedure: 2D Echo, Cardiac Doppler and Color Doppler Indications:    Chest pain  History:        Patient has prior history of Echocardiogram examinations, most                 recent 11/26/2019. CHF, Previous Myocardial Infarction,                 Arrythmias:Atrial  Fibrillation; Risk Factors:Hypertension,                 Diabetes and Dyslipidemia. GERD. Tachy-brady syndrome.  Sonographer:    Clayton Lefort RDCS (AE) Referring Phys: DAVID TAT IMPRESSIONS  1. Severe hypokinesis of the anteroseptal wall with overall mild LV dysfunction.  2. Left ventricular ejection fraction, by estimation, is 40 to 45%. The left ventricle has mildly decreased function. The left ventricle demonstrates regional wall motion abnormalities (see scoring diagram/findings for description). There is severe concentric left ventricular hypertrophy. Left ventricular diastolic parameters are indeterminate.  3. Right ventricular systolic function is normal. The right ventricular size is normal.  4. The mitral valve is normal in structure. Trivial mitral valve regurgitation. No evidence of mitral stenosis.  5. The aortic valve is tricuspid. Aortic valve regurgitation is not visualized. Aortic valve sclerosis is present, with no evidence of aortic valve stenosis.  6. The inferior vena cava is normal in size with greater than 50% respiratory variability, suggesting right atrial pressure of 3 mmHg. FINDINGS  Left Ventricle: Left ventricular ejection fraction, by estimation, is 40 to 45%. The left ventricle has mildly decreased function. The left ventricle demonstrates regional wall motion abnormalities. The left ventricular internal cavity size was normal in size. There is severe concentric left ventricular hypertrophy. Left ventricular diastolic parameters are indeterminate. Right Ventricle: The right ventricular size is normal. Right ventricular systolic function is normal. Left Atrium: Left atrial size was normal in size. Right Atrium: Right atrial size was normal in size. Pericardium: There is no evidence of pericardial effusion. Mitral Valve: The mitral valve is normal in structure. Mild to moderate mitral annular calcification. Trivial mitral valve regurgitation. No evidence of mitral valve stenosis. Tricuspid  Valve: The tricuspid valve is normal in structure. Tricuspid valve regurgitation is trivial. No evidence of tricuspid stenosis. Aortic Valve: The aortic valve is tricuspid.  Aortic valve regurgitation is not visualized. Aortic valve sclerosis is present, with no evidence of aortic valve stenosis. Aortic valve mean gradient measures 2.0 mmHg. Aortic valve peak gradient measures 2.8  mmHg. Aortic valve area, by VTI measures 1.35 cm. Pulmonic Valve: The pulmonic valve was not well visualized. Pulmonic valve regurgitation is not visualized. No evidence of pulmonic stenosis. Aorta: The aortic root is normal in size and structure. Venous: The inferior vena cava is normal in size with greater than 50% respiratory variability, suggesting right atrial pressure of 3 mmHg. IAS/Shunts: No atrial level shunt detected by color flow Doppler. Additional Comments: Severe hypokinesis of the anteroseptal wall with overall mild LV dysfunction. A device lead is visualized.  LEFT VENTRICLE PLAX 2D LVIDd:         3.80 cm LVIDs:         2.80 cm LV PW:         1.50 cm LV IVS:        1.50 cm LVOT diam:     1.80 cm LV SV:         22 LV SV Index:   13 LVOT Area:     2.54 cm  LV Volumes (MOD) LV vol d, MOD A2C: 66.1 ml LV vol d, MOD A4C: 70.7 ml LV vol s, MOD A2C: 46.1 ml LV vol s, MOD A4C: 47.7 ml LV SV MOD A2C:     20.0 ml LV SV MOD A4C:     70.7 ml LV SV MOD BP:      23.1 ml RIGHT VENTRICLE            IVC RV Basal diam:  2.50 cm    IVC diam: 1.50 cm RV S prime:     9.25 cm/s TAPSE (M-mode): 1.4 cm LEFT ATRIUM             Index        RIGHT ATRIUM          Index LA diam:        4.50 cm 2.75 cm/m   RA Area:     9.51 cm LA Vol (A2C):   38.3 ml 23.45 ml/m  RA Volume:   17.90 ml 10.96 ml/m LA Vol (A4C):   40.6 ml 24.85 ml/m LA Biplane Vol: 41.8 ml 25.59 ml/m  AORTIC VALVE AV Area (Vmax):    1.93 cm AV Area (Vmean):   1.83 cm AV Area (VTI):     1.35 cm AV Vmax:           83.30 cm/s AV Vmean:          58.200 cm/s AV VTI:            0.160  m AV Peak Grad:      2.8 mmHg AV Mean Grad:      2.0 mmHg LVOT Vmax:         63.20 cm/s LVOT Vmean:        41.900 cm/s LVOT VTI:          0.085 m LVOT/AV VTI ratio: 0.53  AORTA Ao Root diam: 3.00 cm  SHUNTS Systemic VTI:  0.09 m Systemic Diam: 1.80 cm Kirk Ruths MD Electronically signed by Kirk Ruths MD Signature Date/Time: 05/10/2022/12:56:47 PM    Final     Cardiac Studies   Left heart cath today   Echo 05/10/22:  1. Severe hypokinesis of the anteroseptal wall with overall mild LV  dysfunction.   2. Left ventricular ejection fraction,  by estimation, is 40 to 45%. The  left ventricle has mildly decreased function. The left ventricle  demonstrates regional wall motion abnormalities (see scoring  diagram/findings for description). There is severe  concentric left ventricular hypertrophy. Left ventricular diastolic  parameters are indeterminate.   3. Right ventricular systolic function is normal. The right ventricular  size is normal.   4. The mitral valve is normal in structure. Trivial mitral valve  regurgitation. No evidence of mitral stenosis.   5. The aortic valve is tricuspid. Aortic valve regurgitation is not  visualized. Aortic valve sclerosis is present, with no evidence of aortic  valve stenosis.   6. The inferior vena cava is normal in size with greater than 50%  respiratory variability, suggesting right atrial pressure of 3 mmHg.   FINDINGS   Left Ventricle: Left ventricular ejection fraction, by estimation, is 40  to 45%. The left ventricle has mildly decreased function. The left  ventricle demonstrates regional wall motion abnormalities. The left  ventricular internal cavity size was normal  in size. There is severe concentric left ventricular hypertrophy. Left  ventricular diastolic parameters are indeterminate.   Patient Profile     76 y.o. female transferred from [facility] with increasing angina. History of CAD with distant stent to proximal/mid LCX, occluded OM1  Last intervention to distal RCA done 2017. EF has been normal. Has history of PPM with lead revision complicated by need for pericardiocentesis in 2018.  Most recent PACEART shows < 1% AT/PAF. She is on eliquis. Amiodarone dose decreased by Dr Curt Bears due to increasing tremor Plan for ablation in ? 2022/08/06. Brother died last week and service was Monday Has had increasing angina responsive to nitro. ECG non acute with flat troponin's 19->20->24.  Telemetry called ST but appears to be atypical flutter.   Assessment & Plan    Chest pain CAD - HST 19 --> 20 --> 24 - echo with LVEF now 40-45% with WMA - given history of CAD with PCI and chest pain concerning for angina, will proceed with heart cath today - continue plavix   CKD stage IIIb sCr after overnight hydration 1.5 Baseline creatinine likely near 1.5, although she has fluctuated to as low as 1.09 - consider gentle hydration after heart cath in the setting of new cardiomyopathy - hold ARB   New systolic heart failure Hypertension LVEF now 40-45% with WMA - heart cath pending - PTA on  coreg 6.25 mg BID, olmesartan 5 mg - consider holding ARB today - coreg increased to 12.5 mg BID   PAF Atrial flutter on telemetry 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 Chronic anticoagulation Now with atrial flutter rates in the 80-110s Amiodarone dose reduced prior to admission - ?tremors due to amiodarone - scheduled for evaluation for ablation - EP consulted yesterday and no plans to change antiarrhythmic given history of amiodarone therapy and need for long washout period - do not see an interrogation in shadow chart - appreciate in put from EP - resume Vandervoort after cath, pending results - may need DCCV prior to discharge      For questions or updates, please contact [place] Please consult [email or web address] for contact info under        Signed, Ledora Bottcher, PA  05/11/2022, 10:47 AM    I have seen and examined the patient along with Ledora Bottcher, PA .  I have reviewed the chart, notes and new data.  I agree with PA/NP's note.  Key new complaints: no current CP or  dyspnea Key examination changes: remains in Aflutter w RVR 115 bpm  Key new findings / data: creat 1.5 is higher than it was on admission, but pretty much her previous baseline 1 and 2 years ago. 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 This procedure has been fully reviewed with the patient and written informed consent has been obtained. - Consider cardioversion prior to DC (will need TEE if there is interruption in anticoagulation). Planned February ablation for Afib.  Amio causes tremors, but is needed for rate control and there are no immediate alternative antiarrhythmic options due to long amio half-life. - Questionable benefit from CRT (EF >40%, QRS<132m, not typical LBBB).  MSanda Klein MD, [phone]/20/2023, 1:37 PM

## 2022-05-12 ENCOUNTER — Inpatient Hospital Stay (HOSPITAL_COMMUNITY): Payer: HMO | Admitting: Anesthesiology

## 2022-05-12 ENCOUNTER — Inpatient Hospital Stay (HOSPITAL_COMMUNITY): Payer: HMO

## 2022-05-12 ENCOUNTER — Other Ambulatory Visit (HOSPITAL_COMMUNITY): Payer: Self-pay

## 2022-05-12 ENCOUNTER — Encounter (HOSPITAL_COMMUNITY)
Admission: EM | Disposition: A | Discharge status: Home / Self Care | Payer: Self-pay | Source: Home / Self Care | Attending: Family Medicine

## 2022-05-12 ENCOUNTER — Encounter (HOSPITAL_COMMUNITY): Payer: Self-pay | Admitting: Cardiovascular Disease

## 2022-05-12 DIAGNOSIS — I081 Rheumatic disorders of both mitral and tricuspid valves: Secondary | ICD-10-CM

## 2022-05-12 DIAGNOSIS — I110 Hypertensive heart disease with heart failure: Secondary | ICD-10-CM

## 2022-05-12 DIAGNOSIS — I2510 Atherosclerotic heart disease of native coronary artery without angina pectoris: Secondary | ICD-10-CM

## 2022-05-12 DIAGNOSIS — I340 Nonrheumatic mitral (valve) insufficiency: Secondary | ICD-10-CM | POA: Diagnosis not present

## 2022-05-12 DIAGNOSIS — I4891 Unspecified atrial fibrillation: Secondary | ICD-10-CM

## 2022-05-12 DIAGNOSIS — I4892 Unspecified atrial flutter: Secondary | ICD-10-CM

## 2022-05-12 DIAGNOSIS — I4819 Other persistent atrial fibrillation: Secondary | ICD-10-CM

## 2022-05-12 DIAGNOSIS — N1832 Chronic kidney disease, stage 3b: Secondary | ICD-10-CM | POA: Diagnosis not present

## 2022-05-12 DIAGNOSIS — I5020 Unspecified systolic (congestive) heart failure: Secondary | ICD-10-CM | POA: Diagnosis not present

## 2022-05-12 DIAGNOSIS — I2089 Other forms of angina pectoris: Secondary | ICD-10-CM | POA: Diagnosis not present

## 2022-05-12 DIAGNOSIS — I509 Heart failure, unspecified: Secondary | ICD-10-CM

## 2022-05-12 DIAGNOSIS — I700 Atherosclerosis of aorta: Secondary | ICD-10-CM

## 2022-05-12 HISTORY — PX: TEE WITHOUT CARDIOVERSION: SHX5443

## 2022-05-12 HISTORY — PX: CARDIOVERSION: SHX1299

## 2022-05-12 LAB — PROTIME-INR
INR: 1.2 (ref 0.8–1.2)
Prothrombin Time: 14.6 seconds (ref 11.4–15.2)

## 2022-05-12 LAB — GLUCOSE, CAPILLARY
Glucose-Capillary: 148 mg/dL — ABNORMAL HIGH (ref 70–99)
Glucose-Capillary: 116 mg/dL — ABNORMAL HIGH (ref 70–99)
Glucose-Capillary: 206 mg/dL — ABNORMAL HIGH (ref 70–99)

## 2022-05-12 LAB — BASIC METABOLIC PANEL
Anion gap: 7 (ref 5–15)
BUN: 18 mg/dL (ref 8–23)
CO2: 24 mmol/L (ref 22–32)
Calcium: 8.6 mg/dL — ABNORMAL LOW (ref 8.9–10.3)
Chloride: 108 mmol/L (ref 98–111)
Creatinine, Ser: 1.3 mg/dL — ABNORMAL HIGH (ref 0.44–1.00)
GFR, Estimated: 43 mL/min — ABNORMAL LOW (ref >60)
Glucose, Bld: 169 mg/dL — ABNORMAL HIGH (ref 70–99)
Potassium: 3.9 mmol/L (ref 3.5–5.1)
Sodium: 139 mmol/L (ref 135–145)

## 2022-05-12 LAB — HEMOGLOBIN A1C
Hgb A1c MFr Bld: 6.4 % — ABNORMAL HIGH (ref 4.8–5.6)
Mean Plasma Glucose: 137 mg/dL

## 2022-05-12 LAB — CBC
HCT: 38.2 % (ref 36.0–46.0)
Hemoglobin: 12.4 g/dL (ref 12.0–15.0)
MCH: 30.7 pg (ref 26.0–34.0)
MCHC: 32.5 g/dL (ref 30.0–36.0)
MCV: 94.6 fL (ref 80.0–100.0)
Platelets: 190 10*3/uL (ref 150–400)
RBC: 4.04 MIL/uL (ref 3.87–5.11)
RDW: 15.3 % (ref 11.5–15.5)
WBC: 9.9 10*3/uL (ref 4.0–10.5)
nRBC: 0 % (ref 0.0–0.2)

## 2022-05-12 SURGERY — ECHOCARDIOGRAM, TRANSESOPHAGEAL
Anesthesia: General

## 2022-05-12 MED ORDER — CARVEDILOL 6.25 MG PO TABS: 12.5000 mg | ORAL_TABLET | Freq: Two times a day (BID) | ORAL | 1 refills | Status: DC

## 2022-05-12 MED ORDER — ONDANSETRON HCL 4 MG/2ML IJ SOLN
INTRAMUSCULAR | Status: DC | PRN
  Administered 2022-05-12: 4 mg via INTRAVENOUS

## 2022-05-12 MED ORDER — LIDOCAINE 2% (20 MG/ML) 5 ML SYRINGE
INTRAMUSCULAR | Status: DC | PRN
  Administered 2022-05-12: 100 mg via INTRAVENOUS

## 2022-05-12 MED ORDER — SUCCINYLCHOLINE CHLORIDE 200 MG/10ML IV SOSY
PREFILLED_SYRINGE | INTRAVENOUS | Status: DC | PRN
  Administered 2022-05-12: 80 mg via INTRAVENOUS

## 2022-05-12 MED ORDER — PHENYLEPHRINE 80 MCG/ML (10ML) SYRINGE FOR IV PUSH (FOR BLOOD PRESSURE SUPPORT)
PREFILLED_SYRINGE | INTRAVENOUS | Status: DC | PRN
  Administered 2022-05-12: 80 ug via INTRAVENOUS
  Administered 2022-05-12: 120 ug via INTRAVENOUS

## 2022-05-12 MED ORDER — FENTANYL CITRATE (PF) 100 MCG/2ML IJ SOLN
INTRAMUSCULAR | Status: AC
  Filled 2022-05-12: qty 2

## 2022-05-12 MED ORDER — SODIUM CHLORIDE 0.9 % IV SOLN: INTRAVENOUS | Status: DC

## 2022-05-12 MED ORDER — DEXAMETHASONE SODIUM PHOSPHATE 10 MG/ML IJ SOLN
INTRAMUSCULAR | Status: DC | PRN
  Administered 2022-05-12: 4 mg via INTRAVENOUS

## 2022-05-12 MED ORDER — PROPOFOL 10 MG/ML IV BOLUS
INTRAVENOUS | Status: DC | PRN
  Administered 2022-05-12: 50 mg via INTRAVENOUS
  Administered 2022-05-12: 100 mg via INTRAVENOUS

## 2022-05-12 MED ORDER — CARVEDILOL 6.25 MG PO TABS: 6.2500 mg | ORAL_TABLET | Freq: Two times a day (BID) | ORAL | 1 refills | Status: DC

## 2022-05-12 MED ORDER — SODIUM CHLORIDE 0.9 % IV SOLN: INTRAVENOUS | Status: DC | PRN

## 2022-05-12 MED FILL — Verapamil HCl IV Soln 2.5 MG/ML: INTRAVENOUS | Qty: 2 | Status: AC

## 2022-05-12 MED FILL — Heparin Sodium (Porcine) Inj 1000 Unit/ML: INTRAMUSCULAR | Qty: 10 | Status: AC

## 2022-05-12 NOTE — CV Procedure (Signed)
     PROCEDURE NOTE:  Procedure:  Transesophageal echocardiogram Operator:  Fransico Him, MD Indications:  atrial fibrillation Complications: None  During this procedure the patient is administered a total of 170m Lidocaine, 860msuccinylcholine, 44m15mecadron, 44mg644mfran and Propofol 150 mg and general anesthesia due to Ozempic use.   The patient's heart rate, blood pressure, and oxygen saturation are monitored continuously during the procedure by anesthesia.   Results: Normal LV size with severe LVH and low normal LVF EF 50-55%. Normal RV size and function Normal RA Mild dilated LA with spontaneous echo contrast.  Normal LA appendage with no thrombus.  Decreased LAA emptying velocity. Normal TV with trivial TR Normal PV Normal MV with mild MAC and mild central MR Trileaflet AV with mild Aortic valve sclerosis with no stenosis Normal interatrial septum with no evidence of shunt by colorflow dopper  Grade 1 atherosclerosis of the ascending aorta. Patient went on to DCCV.   Electrical Cardioversion Procedure Note Virginia [id number]: Electrical Cardioversion Indications:  Atrial Fibrillation  Time Out: Verified patient identification, verified procedure,medications/allergies/relevent history reviewed, required imaging and test results available.  Performed  Procedure Details  The patient was NPO after midnight. Anesthesia was administered at the beside  by Dr.Howze  Cardioversion was done with synchronized biphasic defibrillation with AP pads with 150watts.  The patient converted to normal sinus rhythm. The patient tolerated the procedure well   IMPRESSION:  Successful cardioversion of atrial fibrillation to Atrial paced rhythm.  Conor Filsaime 05/12/2022, 11:41 AM

## 2022-05-12 NOTE — Interval H&P Note (Signed)
History and Physical Interval Note:  05/12/2022 11:40 AM  Virginia Huber  has presented today for surgery, with the diagnosis of AFLUTTER.  The various methods of treatment have been discussed with the patient and family. After consideration of risks, benefits and other options for treatment, the patient has consented to  Procedure(s): TRANSESOPHAGEAL ECHOCARDIOGRAM (TEE) (N/A) CARDIOVERSION (N/A) as a surgical intervention.  The patient's history has been reviewed, patient examined, no change in status, stable for surgery.  I have reviewed the patient's chart and labs.  Questions were answered to the patient's satisfaction.     Fransico Him

## 2022-05-12 NOTE — Anesthesia Preprocedure Evaluation (Addendum)
Anesthesia Evaluation  Patient identified by MRN, date of birth, ID band Patient awake    Reviewed: Allergy & Precautions, NPO status , Patient's Chart, lab work & pertinent test results  History of Anesthesia Complications Negative for: history of anesthetic complications  Airway Mallampati: II  TM Distance: >3 FB Neck ROM: Full    Dental  (+) Missing,    Pulmonary sleep apnea    Pulmonary exam normal        Cardiovascular hypertension, + CAD, + Past MI (2013), + Cardiac Stents (2017) and +CHF  Normal cardiovascular exam+ dysrhythmias Atrial Fibrillation + pacemaker   TTE 05/10/22: severe hypokinesis of the anteroseptal wall with overall mild LV  Dysfunction, EF 40-45%, severe LVH, valves ok     Neuro/Psych TIA   GI/Hepatic Neg liver ROS,GERD  ,,  Endo/Other  diabetes (on Ozempic, last dose 05/07/22), Type 2Hypothyroidism    Renal/GU Renal InsufficiencyRenal disease (Cr 1.3)  negative genitourinary   Musculoskeletal  (+) Arthritis ,    Abdominal   Peds  Hematology negative hematology ROS (+)   Anesthesia Other Findings Day of surgery medications reviewed with patient.  Reproductive/Obstetrics negative OB ROS                             Anesthesia Physical Anesthesia Plan  ASA: 3  Anesthesia Plan: General   Post-op Pain Management: Minimal or no pain anticipated   Induction: Rapid sequence and Intravenous  PONV Risk Score and Plan: 3 and Treatment may vary due to age or medical condition, Propofol infusion and Ondansetron  Airway Management Planned: Oral ETT  Additional Equipment: None  Intra-op Plan:   Post-operative Plan: Extubation in OR  Informed Consent: I have reviewed the patients History and Physical, chart, labs and discussed the procedure including the risks, benefits and alternatives for the proposed anesthesia with the patient or authorized representative who has  indicated his/her understanding and acceptance.     Dental advisory given  Plan Discussed with: CRNA  Anesthesia Plan Comments:         Anesthesia Quick Evaluation

## 2022-05-12 NOTE — Progress Notes (Signed)
Echocardiogram Echocardiogram Transesophageal has been performed.  Virginia Huber 05/12/2022, 1:00 PM

## 2022-05-12 NOTE — Transfer of Care (Signed)
Immediate Anesthesia Transfer of Care Note  Patient: Virginia Huber  Procedure(s) Performed: TRANSESOPHAGEAL ECHOCARDIOGRAM (TEE) CARDIOVERSION  Patient Location: Endoscopy Unit  Anesthesia Type:General  Level of Consciousness: awake, alert , and oriented  Airway & Oxygen Therapy: Patient Spontanous Breathing and Patient connected to nasal cannula oxygen  Post-op Assessment: Report given to RN, Post -op Vital signs reviewed and stable, and Patient moving all extremities  Post vital signs: Reviewed and stable  Last Vitals:  Vitals Value Taken Time  BP 120/50 05/12/22 1230  Temp    Pulse 59 05/12/22 1230  Resp 28 05/12/22 1230  SpO2 99 % 05/12/22 1230  Vitals shown include unvalidated device data.  Last Pain:  Vitals:   05/12/22 1042  TempSrc: Oral  PainSc: 0-No pain      Patients Stated Pain Goal: 3 (23/30/07 6226)  Complications: No notable events documented.

## 2022-05-12 NOTE — Discharge Instructions (Signed)
TEE  YOU HAD AN CARDIAC PROCEDURE TODAY: Refer to the procedure report and other information in the discharge instructions given to you for any specific questions about what was found during the examination. If this information does not answer your questions, please call [facility] office at [phone] to clarify.   DIET: Your first meal following the procedure should be a light meal and then it is ok to progress to your normal diet. A half-sandwich or bowl of soup is an example of a good first meal. Heavy or fried foods are harder to digest and may make you feel nauseous or bloated. Drink plenty of fluids but you should avoid alcoholic beverages for 24 hours. If you had a esophageal dilation, please see attached instructions for diet.   ACTIVITY: Your care partner should take you home directly after the procedure. You should plan to take it easy, moving slowly for the rest of the day. You can resume normal activity the day after the procedure however YOU SHOULD NOT DRIVE, use power tools, machinery or perform tasks that involve climbing or major physical exertion for 24 hours (because of the sedation medicines used during the test).   SYMPTOMS TO REPORT IMMEDIATELY: A cardiologist can be reached at any hour. Please call [phone] for any of the following symptoms:  Vomiting of blood or coffee ground material  New, significant abdominal pain  New, significant chest pain or pain under the shoulder blades  Painful or persistently difficult swallowing  New shortness of breath  Black, tarry-looking or red, bloody stools  FOLLOW UP:  Please also call with any specific questions about appointments or follow up tests.  Electrical Cardioversion  Electrical cardioversion is the delivery of a jolt of electricity to restore a normal rhythm to the heart. A rhythm that is too fast or is not regular keeps the heart from pumping well. In this procedure, sticky patches or metal paddles are placed on  the chest to deliver electricity to the heart from a device.  If this information does not answer your questions, please call [facility] office at [phone] to clarify.   Follow these instructions at home: You may have some redness on the skin where the shocks were given.  You may apply over-the-counter hydrocortisone cream or aloe vera to alleviate skin irritation. YOU SHOULD NOT DRIVE, use power tools, machinery or perform tasks that involve climbing or major physical exertion for 24 hours (because of the sedation medicines used during the test).  Take over-the-counter and prescription medicines only as told by your health care provider. Ask your health care provider how to check your pulse. Check it often. Rest for 48 hours after the procedure or as told by your health care provider. Avoid or limit your caffeine use as told by your health care provider. Keep all follow-up visits as told by your health care provider. This is important.  FOLLOW UP:  Please also call with any specific questions about appointments or follow up tests.   

## 2022-05-12 NOTE — Progress Notes (Addendum)
Rounding Note    Patient Name: ANALEI WHINERY Date of Encounter: 05/12/2022  [place] Cardiologist: Rozann Lesches, MD   Subjective   Pt comfortable in bed, feels weak likely from flutter in the 130s  Inpatient Medications    Scheduled Meds:  amiodarone  100 mg Oral Daily   apixaban  5 mg Oral BID   atorvastatin  80 mg Oral Daily   calcitRIOL  0.25 mcg Oral Once per day on Mon Wed Fri   carvedilol  12.5 mg Oral BID   cholecalciferol  2,000 Units Oral QPM   clopidogrel  75 mg Oral Daily   insulin aspart  0-5 Units Subcutaneous QHS   insulin aspart  0-9 Units Subcutaneous TID WC   irbesartan  37.5 mg Oral Daily   levothyroxine  112 mcg Oral QAC breakfast   rOPINIRole  0.5 mg Oral QHS   sodium chloride flush  3 mL Intravenous Q12H   sodium chloride flush  3 mL Intravenous Q12H   Continuous Infusions:  sodium chloride     sodium chloride     PRN Meds: sodium chloride, acetaminophen **OR** acetaminophen, ondansetron **OR** ondansetron (ZOFRAN) IV, sodium chloride flush   Vital Signs    Vitals:   05/12/22 0456 05/12/22 0620 05/12/22 0815 05/12/22 0856  BP: 136/85  (!) 144/85 (!) 139/90  Pulse: (!) 115 (!) 116 (!) 109   Resp: 18  19   Temp: 98 F (36.7 C)  98.9 F (37.2 C)   TempSrc: Oral  Oral   SpO2: 94% 95% 93%   Weight:      Height:        Intake/Output Summary (Last 24 hours) at 05/12/2022 0906 Last data filed at 05/11/2022 2100 Gross per 24 hour  Intake 1468.54 ml  Output --  Net 1468.54 ml      05/09/2022    5:08 AM 05/07/2022   12:48 PM 03/17/2022   10:45 AM  Last 3 Weights  Weight (lbs) 134 lb 7.7 oz 134 lb 8 oz 133 lb 3.2 oz  Weight (kg) 61 kg 61.009 kg 60.419 kg      Telemetry    Atrial flutter with VR 110-130s - Personally Reviewed  ECG    No new tracings - Personally Reviewed  Physical Exam   GEN: No acute distress.   Neck: No JVD Cardiac: irregular rhythm tachycardic rate Respiratory: Clear to auscultation  bilaterally. GI: Soft, nontender, non-distended  MS: No edema; No deformity. Neuro:  Nonfocal  Psych: Normal affect   Labs    High Sensitivity Troponin:   Recent Labs  Lab 05/09/22 0534 05/09/22 0652  TROPONINIHS 20* 24*     Chemistry Recent Labs  Lab 05/10/22 0158 05/11/22 0211 05/12/22 0201  NA 142 139 139  K 3.7 3.7 3.9  CL 110 107 108  CO2 '23 24 24  '$ GLUCOSE 123* 186* 169*  BUN '17 20 18  '$ CREATININE 1.35* 1.51* 1.30*  CALCIUM 8.8* 8.6* 8.6*  MG 1.8  --   --   GFRNONAA 41* 36* 43*  ANIONGAP '9 8 7    '$ Lipids  Recent Labs  Lab 05/10/22 0158  CHOL 96  TRIG 156*  HDL 30*  LDLCALC 35  CHOLHDL 3.2    Hematology Recent Labs  Lab 05/10/22 0158 05/11/22 0211 05/12/22 0201  WBC 9.9 11.5* 9.9  RBC 4.27 4.10 4.04  HGB 13.3 12.4 12.4  HCT 39.7 39.0 38.2  MCV 93.0 95.1 94.6  MCH 31.1 30.2 30.7  MCHC 33.5 31.8 32.5  RDW 15.6* 15.1 15.3  PLT 220 199 190   Thyroid No results for input(s): "TSH", "FREET4" in the last 168 hours.  BNPNo results for input(s): "BNP", "PROBNP" in the last 168 hours.  DDimer No results for input(s): "DDIMER" in the last 168 hours.   Radiology    CARDIAC CATHETERIZATION  Result Date: 05/11/2022   Prox Cx to Mid Cx lesion is 10% stenosed.   1st Mrg lesion is 100% stenosed.   Ost LAD to Mid LAD lesion is 30% stenosed.   Mid LAD to Dist LAD lesion is 30% stenosed.   Ost RCA to Prox RCA lesion is 40% stenosed.   RPAV-1 lesion is 20% stenosed.   RPAV-2 lesion is 70% stenosed.   2nd RPL lesion is 70% stenosed. The LAD is a moderate caliber vessel that courses to the apex. There appears to be diffuse mild to moderate disease in the entire LAD without a focal stenosis. 2. The Circumflex has a patent proximal stented segment. This vessel terminates into an obtuse marginal branch. The small caliber first obtuse marginal branch is chronically occluded. 3. The RCA is a large caliber dominant vessel with a patent stent in the posterolateral artery. The  small caliber distal posterolateral artery beyond the stented segment has moderately severe disease at a bifurcation but the vessels appear too small for PCI. 4. Normal LVEDP Recommendations: Medical management of CAD. Consider adding Imdur or Ranexa for angina in setting of diffuse small vessel disease.   ECHOCARDIOGRAM COMPLETE  Result Date: 05/10/2022    ECHOCARDIOGRAM REPORT   Patient Name:   FOTINI LEMUS Date of Exam: 05/10/2022 Medical Rec #:  [id number]       Height:       63.0 in Accession #:    [id number]      Weight:       134.5 lb Date of Birth:  [birth year]      BSA:          1.633 m Patient Age:    87 years        BP:           156/94 mmHg Patient Gender: F               HR:           114 bpm. Exam Location:  Inpatient Procedure: 2D Echo, Cardiac Doppler and Color Doppler Indications:    Chest pain  History:        Patient has prior history of Echocardiogram examinations, most                 recent 11/26/2019. CHF, Previous Myocardial Infarction,                 Arrythmias:Atrial Fibrillation; Risk Factors:Hypertension,                 Diabetes and Dyslipidemia. GERD. Tachy-brady syndrome.  Sonographer:    Clayton Lefort RDCS (AE) Referring Phys: DAVID TAT IMPRESSIONS  1. Severe hypokinesis of the anteroseptal wall with overall mild LV dysfunction.  2. Left ventricular ejection fraction, by estimation, is 40 to 45%. The left ventricle has mildly decreased function. The left ventricle demonstrates regional wall motion abnormalities (see scoring diagram/findings for description). There is severe concentric left ventricular hypertrophy. Left ventricular diastolic parameters are indeterminate.  3. Right ventricular systolic function is normal. The right ventricular size is normal.  4. The mitral valve is normal in structure. Trivial mitral  valve regurgitation. No evidence of mitral stenosis.  5. The aortic valve is tricuspid. Aortic valve regurgitation is not visualized. Aortic valve sclerosis is present,  with no evidence of aortic valve stenosis.  6. The inferior vena cava is normal in size with greater than 50% respiratory variability, suggesting right atrial pressure of 3 mmHg. FINDINGS  Left Ventricle: Left ventricular ejection fraction, by estimation, is 40 to 45%. The left ventricle has mildly decreased function. The left ventricle demonstrates regional wall motion abnormalities. The left ventricular internal cavity size was normal in size. There is severe concentric left ventricular hypertrophy. Left ventricular diastolic parameters are indeterminate. Right Ventricle: The right ventricular size is normal. Right ventricular systolic function is normal. Left Atrium: Left atrial size was normal in size. Right Atrium: Right atrial size was normal in size. Pericardium: There is no evidence of pericardial effusion. Mitral Valve: The mitral valve is normal in structure. Mild to moderate mitral annular calcification. Trivial mitral valve regurgitation. No evidence of mitral valve stenosis. Tricuspid Valve: The tricuspid valve is normal in structure. Tricuspid valve regurgitation is trivial. No evidence of tricuspid stenosis. Aortic Valve: The aortic valve is tricuspid. Aortic valve regurgitation is not visualized. Aortic valve sclerosis is present, with no evidence of aortic valve stenosis. Aortic valve mean gradient measures 2.0 mmHg. Aortic valve peak gradient measures 2.8  mmHg. Aortic valve area, by VTI measures 1.35 cm. Pulmonic Valve: The pulmonic valve was not well visualized. Pulmonic valve regurgitation is not visualized. No evidence of pulmonic stenosis. Aorta: The aortic root is normal in size and structure. Venous: The inferior vena cava is normal in size with greater than 50% respiratory variability, suggesting right atrial pressure of 3 mmHg. IAS/Shunts: No atrial level shunt detected by color flow Doppler. Additional Comments: Severe hypokinesis of the anteroseptal wall with overall mild LV  dysfunction. A device lead is visualized.  LEFT VENTRICLE PLAX 2D LVIDd:         3.80 cm LVIDs:         2.80 cm LV PW:         1.50 cm LV IVS:        1.50 cm LVOT diam:     1.80 cm LV SV:         22 LV SV Index:   13 LVOT Area:     2.54 cm  LV Volumes (MOD) LV vol d, MOD A2C: 66.1 ml LV vol d, MOD A4C: 70.7 ml LV vol s, MOD A2C: 46.1 ml LV vol s, MOD A4C: 47.7 ml LV SV MOD A2C:     20.0 ml LV SV MOD A4C:     70.7 ml LV SV MOD BP:      23.1 ml RIGHT VENTRICLE            IVC RV Basal diam:  2.50 cm    IVC diam: 1.50 cm RV S prime:     9.25 cm/s TAPSE (M-mode): 1.4 cm LEFT ATRIUM             Index        RIGHT ATRIUM          Index LA diam:        4.50 cm 2.75 cm/m   RA Area:     9.51 cm LA Vol (A2C):   38.3 ml 23.45 ml/m  RA Volume:   17.90 ml 10.96 ml/m LA Vol (A4C):   40.6 ml 24.85 ml/m LA Biplane Vol: 41.8 ml 25.59 ml/m  AORTIC  VALVE AV Area (Vmax):    1.93 cm AV Area (Vmean):   1.83 cm AV Area (VTI):     1.35 cm AV Vmax:           83.30 cm/s AV Vmean:          58.200 cm/s AV VTI:            0.160 m AV Peak Grad:      2.8 mmHg AV Mean Grad:      2.0 mmHg LVOT Vmax:         63.20 cm/s LVOT Vmean:        41.900 cm/s LVOT VTI:          0.085 m LVOT/AV VTI ratio: 0.53  AORTA Ao Root diam: 3.00 cm  SHUNTS Systemic VTI:  0.09 m Systemic Diam: 1.80 cm Kirk Ruths MD Electronically signed by Kirk Ruths MD Signature Date/Time: 05/10/2022/12:56:47 PM    Final     Cardiac Studies   TEE-DCCV today   Left heart cath 05/11/22:   Prox Cx to Mid Cx lesion is 10% stenosed.   1st Mrg lesion is 100% stenosed.   Ost LAD to Mid LAD lesion is 30% stenosed.   Mid LAD to Dist LAD lesion is 30% stenosed.   Ost RCA to Prox RCA lesion is 40% stenosed.   RPAV-1 lesion is 20% stenosed.   RPAV-2 lesion is 70% stenosed.   2nd RPL lesion is 70% stenosed.   The LAD is a moderate caliber vessel that courses to the apex. There appears to be diffuse mild to moderate disease in the entire LAD without a focal stenosis.   2. The Circumflex has a patent proximal stented segment. This vessel terminates into an obtuse marginal branch. The small caliber first obtuse marginal branch is chronically occluded.  3. The RCA is a large caliber dominant vessel with a patent stent in the posterolateral artery. The small caliber distal posterolateral artery beyond the stented segment has moderately severe disease at a bifurcation but the vessels appear too small for PCI.  4. Normal LVEDP   Recommendations: Medical management of CAD. Consider adding Imdur or Ranexa for angina in setting of diffuse small vessel disease.    Echo 05/10/22:  1. Severe hypokinesis of the anteroseptal wall with overall mild LV  dysfunction.   2. Left ventricular ejection fraction, by estimation, is 40 to 45%. The  left ventricle has mildly decreased function. The left ventricle  demonstrates regional wall motion abnormalities (see scoring  diagram/findings for description). There is severe  concentric left ventricular hypertrophy. Left ventricular diastolic  parameters are indeterminate.   3. Right ventricular systolic function is normal. The right ventricular  size is normal.   4. The mitral valve is normal in structure. Trivial mitral valve  regurgitation. No evidence of mitral stenosis.   5. The aortic valve is tricuspid. Aortic valve regurgitation is not  visualized. Aortic valve sclerosis is present, with no evidence of aortic  valve stenosis.   6. The inferior vena cava is normal in size with greater than 50%  respiratory variability, suggesting right atrial pressure of 3 mmHg.   Patient Profile     76 y.o. female  transferred from [facility] with increasing angina. History of CAD with distant stent to proximal/mid LCX, occluded OM1 Last intervention to distal RCA done 2017. EF has been normal. Has history of PPM with lead revision complicated by need for pericardiocentesis in 2018.  Most recent PACEART shows < 1%  AT/PAF. She is on eliquis.  Amiodarone dose decreased by Dr Curt Bears due to increasing tremor Plan for ablation in ? 26-Aug-2022. Brother died last week and service was Monday Has had increasing angina responsive to nitro. ECG non acute with flat troponin's 19->20->24.  Telemetry called ST but appears to be atypical flutter.    Assessment & Plan    Chest pain CAD Hyperlipidemia with LDL goal < 70 HST 19 --> 20 --> 24 Echo with LVEF now 40-45% and WMA Cath yesterday with small vessel disease including 100% occluded first marginal, no PCI. Medical therapy was recommended. - consider additional ranexa vs imdur - continue plavix and eliquis, no ASA - continue statin and BB 05/10/2022: Cholesterol 96; HDL 30; LDL Cholesterol 35; Triglycerides 156; VLDL 31 - LDL well-controlled   CKD stage IIIb Renal function 1.5 before cath, 1.3 today Continue ARB   PAF Atrial flutter Chronic anticoagulation 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 EP following Planned for TEE-guided DCCV today and ablation in 08-26-2022 (she's on the cancellation list) Will continue 100 mg amiodarone for now - not a candidate for alternative antiarrhythmic given long washout time for amiodarone - tremors with amiodarone, hopefully can come off of this after ablation Eliquis resumed after heart cath  I reviewed TEE-DCCV with her.  [place] has been requested to perform a transesophageal echocardiogram on [name] for atrial flutter.  After careful review of history and examination, the risks and benefits of transesophageal echocardiogram have been explained including risks of esophageal damage, perforation (1:10,000 risk), bleeding, pharyngeal hematoma as well as other potential complications associated with conscious sedation including aspiration, arrhythmia, respiratory failure and death. Alternatives to treatment were discussed, questions were answered. Patient is willing to proceed.    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       For questions or updates, please contact [place] Please consult [email or web address] for contact info under        Signed, Ledora Bottcher, PA  05/12/2022, 9:06 AM    I have seen and examined the patient along with Ledora Bottcher, PA.  I have reviewed the chart, notes and new data.  I agree with PA/NP's note.  Key new complaints: Seen shortly after cardioversion, still mildly sedated. Key examination changes: Clear lungs, regular rate and rhythm Key new findings / data: No evidence of left atrial thrombus on TEE, but she had severe LVH and mildly dilated left atrium, no significant valve problems.  Converted to normal rhythm with a single 150 J shock  PLAN: Continue lifelong anticoagulation, without interruption actually for the next 30 days. Poorly tolerant of amiodarone due to hand tremor, but there are no other good choices for rhythm/rate control in the short-term, until she has her ablation procedure in 08-26-2022. 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  Anticipate improvement now that she is in normal rhythm.  Continue carvedilol and ARB.  Blood pressure may not tolerate Entresto.  LVEDP was normal at yesterday's heart cath.  Heart failure has not been a big part of her clinical complaints, but if this becomes the case consider adding spironolactone and SGLT2 inhibitor.  She feels well after fully recovering from the procedure it is reasonable to discharge her home on the current medications.  Important to continue anticoagulation without  interruption.   Has a follow-up appointment already scheduled with Finis Bud, NP for 05/18/2022 with Oda Kilts in the EP clinic on 05/29/2022  Sanda Klein, MD, [facility] [phone] 05/12/2022, 1:37 PM

## 2022-05-12 NOTE — Care Management Important Message (Signed)
Important Message  Patient Details  Name: Virginia Huber MRN: [id number] Date of Birth: 1946-02-02   Medicare Important Message Given:  Yes     Shelda Altes 05/12/2022, 10:42 AM

## 2022-05-12 NOTE — Anesthesia Postprocedure Evaluation (Signed)
Anesthesia Post Note  Patient: Virginia Huber  Procedure(s) Performed: TRANSESOPHAGEAL ECHOCARDIOGRAM (TEE) CARDIOVERSION     Patient location during evaluation: PACU Anesthesia Type: General Level of consciousness: awake and alert Pain management: pain level controlled Vital Signs Assessment: post-procedure vital signs reviewed and stable Respiratory status: spontaneous breathing, nonlabored ventilation and respiratory function stable Cardiovascular status: blood pressure returned to baseline Postop Assessment: no apparent nausea or vomiting Anesthetic complications: no   No notable events documented.  Last Vitals:  Vitals:   05/12/22 1300 05/12/22 1324  BP: (!) 119/53 132/80  Pulse: 64 64  Resp: 16 17  Temp:  (!) 36.3 C  SpO2: 96%     Last Pain:  Vitals:   05/12/22 1324  TempSrc: Oral  PainSc:                  [name]

## 2022-05-12 NOTE — Discharge Summary (Signed)
Physician Discharge Summary  Virginia Huber:[id number] DOB: February 26, 1946 DOA: 05/09/2022  PCP: Sharilyn Sites, MD  Admit date: 05/09/2022 Discharge date: 05/12/2022  Time spent: 40 minutes  Recommendations for Outpatient Follow-up:  Follow outpatient CBC/CMP  Follow cardiology outpatient Consider imdur/ranexa outpatient per cardiology   Discharge Diagnoses:  Principal Problem:   Angina at rest Active Problems:   Mixed hyperlipidemia   Essential hypertension   Hypothyroidism   Type 2 diabetes with nephropathy ([place])   Hypokalemia   Persistent atrial fibrillation (HCC)   Tachy-brady syndrome (HCC)   Chronic renal failure, stage 3b (HCC)   Paroxysmal atrial fibrillation ([place])   Chronic heart failure with preserved ejection fraction (HFpEF) ([place])   Discharge Condition: stable  Diet recommendation: heart healthy, diabetic  Filed Weights   05/09/22 0508  Weight: 61 kg    History of present illness:  Virginia Huber is [name] 76 year old female with Rawson Minix history of diabetes mellitus type 2, hypertension, hyperlipidemia, CKD stage III, paroxysmal atrial fibrillation, HFpEF, coronary disease status post angioplasty of the OM1 in 2016, DES to the circumflex in 2016, and DES to the RCA in 2017 who presented to the ED at [facility] 11/18 with substernal chest pain that woke her up in the early morning. The patient has some shortness of breath and diaphoresis.  She denies any dizziness or syncope.  She denies any nausea, vomiting.  She has not had any fevers, chills, coughing, hemoptysis.  Notably, the patient had Deniese Oberry mechanical fall about 5 days prior to this admission.  She went to urgent care on 05/07/2022 because of left-sided rib pain.  X-rays of her ribs at that time were negative for any fractures.  She continues to have pleuritic rib pain on the left side.   In the ED, the patient was afebrile and hemodynamically stable with oxygen saturation 99% room air.  Heart rate was mildly elevated  105-110.  WBC 8.3, hemoglobin 13.4, platelets 230,000.  Sodium 142, potassium 2.9, bicarbonate 24, serum creatinine 1.09.  Troponin 20>>24.  EKG showed sinus tachycardia with IVCD.  Chest x-ray was negative for any infiltrates or edema.  Cardiology was consulted, Dr. Stanford Breed who recommended transfer to [facility] for evaluation. Notably, the patient was given nitroglycerin x1 in the ED with relief of her chest discomfort.  Her chest discomfort subsequently returned and she was given Dhillon Comunale second nitroglycerin with relief once again.   Cardiac monitoring has revealed atypical atrial flutter for which EP was consulted. She has an ablation scheduled in February 2024. EP was consulted, recommends continuing amiodarone, will see if ablation procedure can be moved sooner.   Cardiac catheterization is planned 11/20. Cardiology recommended medical management of CAD based on cath.  She had TEE/DCCV on 11/21 and had successful cardioversion of atrial fibrillation to atrial paced rhythm.  She was stable for discharge on 11/21 PM and discharged with Michele Judy plan for outpatient cardiology follow up.  Hospital Course:  Assessment and Plan: Angina in patient with CAD:  - Continue plavix, statin, coreg.  - Echo with severe hypokinesis of anteroseptal wall with mild LV dysfunction, EF 40-45%, indeterminate diastolic parameters - TEE showed normal LV size with severe LVH and low normal EF 50-55% (see report) - Appreciate cardiology recommendations, s/p LHC - see report -> recommending medical management of CAD, consider adding imdur or ranxea for angina in setting of diffuse small vessel disease (discussed with cardiology and they're deferring this to outpatient, want to avoid ranexa + amio).   Paroxysmal atrial  fibrillation, atrial flutter:  - EP consulted, continue beta blocker and amiodarone - continue eliquis - s/p TEE/DCCV 11/21, successful cardioversion of atrial fibrillation to atrial paced rhythm   HFmrEF:  Appears euvolemic - Continue coreg (increased to 12.5 mg BID) - echo with EF 40-45%, regional wall motion abnormalities (severe hypokinesis of anteroseptal wall) - TEE showed low normal EF 50-55%, severe LVH  - consider GDMT outpatient per cardiology (at this time, not BP might not tolerate entresto, LVEDP normal with LHC, could consider SGLT2 and spironolactone in future)   AKI on Stage IIIb CKD: Baselin creatinine 1.1-1.4, remains within that range. - Avoid nephrotoxins as able, will need contrast with cath.  - creatinine around baseline today - she's also receiving arb, monitor - strict I/O, daily weights   Tachy-brady syndrome ([place]) S/p PPM 06/2016 Follows Dr. Curt Bears   Hypokalemia: Resolved with supplementation.   T2DM: 02/09/22 A1C--6.0 - BG's have been ok here off long acting insulin, she seems to do well on her home regimen, ok to continue that, but recommended she follow BG's closely    Hypothyroidism: TSH 0.571 - Continue levothyroxine   Essential hypertension - Continue olmesartan and carvedilol   Mixed hyperlipidemia - Continue atorvastatin     Procedures: cardiology   Consultations: Echo IMPRESSIONS     1. Severe hypokinesis of the anteroseptal wall with overall mild LV  dysfunction.   2. Left ventricular ejection fraction, by estimation, is 40 to 45%. The  left ventricle has mildly decreased function. The left ventricle  demonstrates regional wall motion abnormalities (see scoring  diagram/findings for description). There is severe  concentric left ventricular hypertrophy. Left ventricular diastolic  parameters are indeterminate.   3. Right ventricular systolic function is normal. The right ventricular  size is normal.   4. The mitral valve is normal in structure. Trivial mitral valve  regurgitation. No evidence of mitral stenosis.   5. The aortic valve is tricuspid. Aortic valve regurgitation is not  visualized. Aortic valve sclerosis is present,  with no evidence of aortic  valve stenosis.   6. The inferior vena cava is normal in size with greater than 50%  respiratory variability, suggesting right atrial pressure of 3 mmHg.   LHC Prox Cx to Mid Cx lesion is 10% stenosed.   1st Mrg lesion is 100% stenosed.   Ost LAD to Mid LAD lesion is 30% stenosed.   Mid LAD to Dist LAD lesion is 30% stenosed.   Ost RCA to Prox RCA lesion is 40% stenosed.   RPAV-1 lesion is 20% stenosed.   RPAV-2 lesion is 70% stenosed.   2nd RPL lesion is 70% stenosed.   The LAD is Annielee Jemmott moderate caliber vessel that courses to the apex. There appears to be diffuse mild to moderate disease in the entire LAD without Lesley Galentine focal stenosis.  2. The Circumflex has Shean Gerding patent proximal stented segment. This vessel terminates into an obtuse marginal branch. The small caliber first obtuse marginal branch is chronically occluded.  3. The RCA is Elina Streng large caliber dominant vessel with Maniah Nading patent stent in the posterolateral artery. The small caliber distal posterolateral artery beyond the stented segment has moderately severe disease at Wynne Jury bifurcation but the vessels appear too small for PCI.  4. Normal LVEDP   Recommendations: Medical management of CAD. Consider adding Imdur or Ranexa for angina in setting of diffuse small vessel disease.   Results: Normal LV size with severe LVH and low normal LVF EF 50-55%. Normal RV size and function  Normal RA Mild dilated LA with spontaneous echo contrast.  Normal LA appendage with no thrombus.  Decreased LAA emptying velocity. Normal TV with trivial TR Normal PV Normal MV with mild MAC and mild central MR Trileaflet AV with mild Aortic valve sclerosis with no stenosis Normal interatrial septum with no evidence of shunt by colorflow dopper  Grade 1 atherosclerosis of the ascending aorta. Patient went on to DCCV.  IMPRESSION:   Successful cardioversion of atrial fibrillation to Atrial paced rhythm.  Discharge Exam: Vitals:   05/12/22 1324  05/12/22 1555  BP: 132/80 (!) 141/53  Pulse: 64 70  Resp: 17 18  Temp: (!) 97.4 F (36.3 C) 97.7 F (36.5 C)  SpO2:     No new complaints She's eager to discharge after her procedure.  They have Mileidy Atkin thanksgiving dinner tonight which is early to accommodate for family.  General: No acute distress. Cardiovascular: Heart sounds show Carnisha Feltz regular rate, and rhythm.  Lungs: Clear to auscultation bilaterally Abdomen: Soft, nontender, nondistended Neurological: Alert and oriented 3. Moves all extremities 4 with equal strength. Cranial nerves II through XII grossly intact. Extremities: No clubbing or cyanosis. No edema.   Discharge Instructions   Discharge Instructions     (HEART FAILURE PATIENTS) Call MD:  Anytime you have any of the following symptoms: 1) 3 pound weight gain in 24 hours or 5 pounds in 1 week 2) shortness of breath, with or without Fredis Malkiewicz dry hacking cough 3) swelling in the hands, feet or stomach 4) if you have to sleep on extra pillows at night in order to breathe.   Complete by: As directed    Call MD for:  difficulty breathing, headache or visual disturbances   Complete by: As directed    Call MD for:  extreme fatigue   Complete by: As directed    Call MD for:  hives   Complete by: As directed    Call MD for:  persistant dizziness or light-headedness   Complete by: As directed    Call MD for:  persistant nausea and vomiting   Complete by: As directed    Call MD for:  redness, tenderness, or signs of infection (pain, swelling, redness, odor or green/yellow discharge around incision site)   Complete by: As directed    Call MD for:  severe uncontrolled pain   Complete by: As directed    Call MD for:  temperature >100.4   Complete by: As directed    Diet - low sodium heart healthy   Complete by: As directed    Discharge instructions   Complete by: As directed    You were seen with chest pain.  You had Lexany Belknap cardiac catheterization and they recommended medical management of  your coronary artery disease.  Cardiology did Amonda Brillhart transesophageal echo and cardioversion on 11/21 and you're now in Karlisa Gaubert normal heart rhythm.   It's extremely important to continue your eliquis as prescribed at this point in time.   Continue your plavix and lipitor.  We've increased your coreg to 12.5 mg twice daily.  Please follow up with cardiology as scheduled as an outpatient.   Follow up with your PCP within about 1 week.  Return for new, recurrent, or worsening symptoms.  Please ask your PCP to request records from this hospitalization so they know what was done and what the next steps will be.   Increase activity slowly   Complete by: As directed       Allergies as of 05/12/2022  Reactions   Penicillins Hives   Has patient had Kycen Spalla PCN reaction causing immediate rash, facial/tongue/throat swelling, SOB or lightheadedness with hypotension: Yes Has patient had Darrion Wyszynski PCN reaction causing severe rash involving mucus membranes or skin necrosis: No Has patient had Ayce Pietrzyk PCN reaction that required hospitalization No Has patient had Kinnedy Mongiello PCN reaction occurring within the last 10 years: No If all of the above answers are "NO", then may proceed with Cephalosporin use.   Percocet [oxycodone-acetaminophen] Nausea And Vomiting        Medication List     STOP taking these medications    NovoLIN 70/30 (70-30) 100 UNIT/ML injection Generic drug: insulin NPH-regular Human       TAKE these medications    acetaminophen 325 MG tablet Commonly known as: TYLENOL Take 650 mg by mouth every 6 (six) hours as needed for headache.   amiodarone 200 MG tablet Commonly known as: PACERONE Take 0.5 tablets (100 mg total) by mouth daily.   atorvastatin 80 MG tablet Commonly known as: LIPITOR TAKE ONE TABLET BY MOUTH EVERY DAY AT 6:00PM What changed: See the new instructions.   calcitRIOL 0.25 MCG capsule Commonly known as: ROCALTROL Take 0.25 mcg by mouth 3 (three) times Eriel Dunckel week.   carvedilol  6.25 MG tablet Commonly known as: COREG Take 2 tablets (12.5 mg total) by mouth 2 (two) times daily. What changed: how much to take   clopidogrel 75 MG tablet Commonly known as: PLAVIX TAKE ONE TABLET (75MG TOTAL) BY MOUTH DAILY   Eliquis 5 MG Tabs tablet Generic drug: apixaban TAKE ONE TABLET BY MOUTH TWICE DAILY   FreeStyle Libre 2 Reader Devi 1 each by Does not apply route daily.   FreeStyle Libre 2 Sensor Misc USE ONE FOR FOURTEEN DAYS   FreeStyle Precision Neo Test test strip Generic drug: glucose blood Use as instructed   levothyroxine 112 MCG tablet Commonly known as: SYNTHROID TAKE ONE TABLET BY MOUTH ONCE DAILY   nitroGLYCERIN 0.4 MG SL tablet Commonly known as: NITROSTAT Place 1 tablet (0.4 mg total) under the tongue every 5 (five) minutes as needed for chest pain.   NovoLIN N FlexPen 100 UNIT/ML Kiwkpen Generic drug: Insulin NPH (Human) ([place]) INJECT 25 UNITS in THE MORNING AND 15 UNITS BEFORE dinner   olmesartan 5 MG tablet Commonly known as: BENICAR Take 5 mg by mouth in the morning and at bedtime.   rOPINIRole 0.5 MG tablet Commonly known as: REQUIP Take 0.5 mg by mouth at bedtime.   Semaglutide (2 MG/DOSE) 8 MG/3ML Sopn Inject 2 mg as directed once Bryann Mcnealy week. What changed: additional instructions   VITAMIN D3 PO Take 2,000 Units by mouth every evening.       Allergies  Allergen Reactions   Penicillins Hives    Has patient had Jaelin Devincentis PCN reaction causing immediate rash, facial/tongue/throat swelling, SOB or lightheadedness with hypotension: Yes Has patient had Mitsuru Dault PCN reaction causing severe rash involving mucus membranes or skin necrosis: No Has patient had Almer Littleton PCN reaction that required hospitalization No Has patient had Ashutosh Dieguez PCN reaction occurring within the last 10 years: No If all of the above answers are "NO", then may proceed with Cephalosporin use.   Percocet [Oxycodone-Acetaminophen] Nausea And Vomiting      The results of significant  diagnostics from this hospitalization (including imaging, microbiology, ancillary and laboratory) are listed below for reference.    Significant Diagnostic Studies: CARDIAC CATHETERIZATION  Result Date: 05/11/2022   Prox Cx to Mid Cx lesion is 10%  stenosed.   1st Mrg lesion is 100% stenosed.   Ost LAD to Mid LAD lesion is 30% stenosed.   Mid LAD to Dist LAD lesion is 30% stenosed.   Ost RCA to Prox RCA lesion is 40% stenosed.   RPAV-1 lesion is 20% stenosed.   RPAV-2 lesion is 70% stenosed.   2nd RPL lesion is 70% stenosed. The LAD is Mellony Danziger moderate caliber vessel that courses to the apex. There appears to be diffuse mild to moderate disease in the entire LAD without Tametra Ahart focal stenosis. 2. The Circumflex has Chrisma Hurlock patent proximal stented segment. This vessel terminates into an obtuse marginal branch. The small caliber first obtuse marginal branch is chronically occluded. 3. The RCA is Dreanna Kyllo large caliber dominant vessel with Consuelo Suthers patent stent in the posterolateral artery. The small caliber distal posterolateral artery beyond the stented segment has moderately severe disease at Dyan Labarbera bifurcation but the vessels appear too small for PCI. 4. Normal LVEDP Recommendations: Medical management of CAD. Consider adding Imdur or Ranexa for angina in setting of diffuse small vessel disease.   ECHOCARDIOGRAM COMPLETE  Result Date: 05/10/2022    ECHOCARDIOGRAM REPORT   Patient Name:   CALIN ELLERY Date of Exam: 05/10/2022 Medical Rec #:  [id number]       Height:       63.0 in Accession #:    [id number]      Weight:       134.5 lb Date of Birth:  1946/04/26      BSA:          1.633 m Patient Age:    52 years        BP:           156/94 mmHg Patient Gender: F               HR:           114 bpm. Exam Location:  Inpatient Procedure: 2D Echo, Cardiac Doppler and Color Doppler Indications:    Chest pain  History:        Patient has prior history of Echocardiogram examinations, most                 recent 11/26/2019. CHF, Previous Myocardial  Infarction,                 Arrythmias:Atrial Fibrillation; Risk Factors:Hypertension,                 Diabetes and Dyslipidemia. GERD. Tachy-brady syndrome.  Sonographer:    Clayton Lefort RDCS (AE) Referring Phys: DAVID TAT IMPRESSIONS  1. Severe hypokinesis of the anteroseptal wall with overall mild LV dysfunction.  2. Left ventricular ejection fraction, by estimation, is 40 to 45%. The left ventricle has mildly decreased function. The left ventricle demonstrates regional wall motion abnormalities (see scoring diagram/findings for description). There is severe concentric left ventricular hypertrophy. Left ventricular diastolic parameters are indeterminate.  3. Right ventricular systolic function is normal. The right ventricular size is normal.  4. The mitral valve is normal in structure. Trivial mitral valve regurgitation. No evidence of mitral stenosis.  5. The aortic valve is tricuspid. Aortic valve regurgitation is not visualized. Aortic valve sclerosis is present, with no evidence of aortic valve stenosis.  6. The inferior vena cava is normal in size with greater than 50% respiratory variability, suggesting right atrial pressure of 3 mmHg. FINDINGS  Left Ventricle: Left ventricular ejection fraction, by estimation, is 40 to 45%. The left ventricle has mildly  decreased function. The left ventricle demonstrates regional wall motion abnormalities. The left ventricular internal cavity size was normal in size. There is severe concentric left ventricular hypertrophy. Left ventricular diastolic parameters are indeterminate. Right Ventricle: The right ventricular size is normal. Right ventricular systolic function is normal. Left Atrium: Left atrial size was normal in size. Right Atrium: Right atrial size was normal in size. Pericardium: There is no evidence of pericardial effusion. Mitral Valve: The mitral valve is normal in structure. Mild to moderate mitral annular calcification. Trivial mitral valve regurgitation. No  evidence of mitral valve stenosis. Tricuspid Valve: The tricuspid valve is normal in structure. Tricuspid valve regurgitation is trivial. No evidence of tricuspid stenosis. Aortic Valve: The aortic valve is tricuspid. Aortic valve regurgitation is not visualized. Aortic valve sclerosis is present, with no evidence of aortic valve stenosis. Aortic valve mean gradient measures 2.0 mmHg. Aortic valve peak gradient measures 2.8  mmHg. Aortic valve area, by VTI measures 1.35 cm. Pulmonic Valve: The pulmonic valve was not well visualized. Pulmonic valve regurgitation is not visualized. No evidence of pulmonic stenosis. Aorta: The aortic root is normal in size and structure. Venous: The inferior vena cava is normal in size with greater than 50% respiratory variability, suggesting right atrial pressure of 3 mmHg. IAS/Shunts: No atrial level shunt detected by color flow Doppler. Additional Comments: Severe hypokinesis of the anteroseptal wall with overall mild LV dysfunction. Duffy Dantonio device lead is visualized.  LEFT VENTRICLE PLAX 2D LVIDd:         3.80 cm LVIDs:         2.80 cm LV PW:         1.50 cm LV IVS:        1.50 cm LVOT diam:     1.80 cm LV SV:         22 LV SV Index:   13 LVOT Area:     2.54 cm  LV Volumes (MOD) LV vol d, MOD A2C: 66.1 ml LV vol d, MOD A4C: 70.7 ml LV vol s, MOD A2C: 46.1 ml LV vol s, MOD A4C: 47.7 ml LV SV MOD A2C:     20.0 ml LV SV MOD A4C:     70.7 ml LV SV MOD BP:      23.1 ml RIGHT VENTRICLE            IVC RV Basal diam:  2.50 cm    IVC diam: 1.50 cm RV S prime:     9.25 cm/s TAPSE (M-mode): 1.4 cm LEFT ATRIUM             Index        RIGHT ATRIUM          Index LA diam:        4.50 cm 2.75 cm/m   RA Area:     9.51 cm LA Vol (A2C):   38.3 ml 23.45 ml/m  RA Volume:   17.90 ml 10.96 ml/m LA Vol (A4C):   40.6 ml 24.85 ml/m LA Biplane Vol: 41.8 ml 25.59 ml/m  AORTIC VALVE AV Area (Vmax):    1.93 cm AV Area (Vmean):   1.83 cm AV Area (VTI):     1.35 cm AV Vmax:           83.30 cm/s AV Vmean:           58.200 cm/s AV VTI:            0.160 m AV Peak Grad:  2.8 mmHg AV Mean Grad:      2.0 mmHg LVOT Vmax:         63.20 cm/s LVOT Vmean:        41.900 cm/s LVOT VTI:          0.085 m LVOT/AV VTI ratio: 0.53  AORTA Ao Root diam: 3.00 cm  SHUNTS Systemic VTI:  0.09 m Systemic Diam: 1.80 cm Kirk Ruths MD Electronically signed by Kirk Ruths MD Signature Date/Time: 05/10/2022/12:56:47 PM    Final    DG Chest Portable 1 View  Result Date: 05/09/2022 CLINICAL DATA:  Chest pain starting at 4 Parker Sawatzky.m. EXAM: PORTABLE CHEST 1 VIEW COMPARISON:  05/07/2022 FINDINGS: Normal heart size and mediastinal contours. Dual-chamber pacer leads from the left. There is no edema, consolidation, effusion, or pneumothorax. IMPRESSION: No acute finding, stable from prior. Electronically Signed   By: Jorje Guild M.D.   On: 05/09/2022 05:57   DG Ribs Unilateral W/Chest Left  Result Date: 05/07/2022 CLINICAL DATA:  Recent fall, left chest pain EXAM: LEFT RIBS AND CHEST - 3+ VIEW COMPARISON:  10/24/2019 FINDINGS: Cardiac size is within normal limits. Pacemaker battery is seen in left infraclavicular region. Surgical clips are seen in right upper quadrant. There are no signs of pulmonary edema or focal pulmonary consolidation. There is interval clearing of pulmonary vascular congestion and pulmonary edema. There is no pleural effusion or pneumothorax. No displaced fractures are seen in left ribs. IMPRESSION: No displaced fractures are seen in left ribs. There are no signs of pulmonary edema or focal pulmonary consolidation. There is no pleural effusion or pneumothorax. Electronically Signed   By: Elmer Picker M.D.   On: 05/07/2022 15:13    Microbiology: No results found for this or any previous visit (from the past 240 hour(s)).   Labs: Basic Metabolic Panel: Recent Labs  Lab 05/09/22 0534 05/10/22 0158 05/11/22 0211 05/12/22 0201  NA 142 142 139 139  K 2.9* 3.7 3.7 3.9  CL 113* 110 107 108  CO2 _0 GLUCOSE 114* 123* 186* 169*  BUN _1 CREATININE 1.09* 1.35* 1.51* 1.30*  CALCIUM 8.0* 8.8* 8.6* 8.6*  MG  --  1.8  --   --    Liver Function Tests: No results for input(s): "AST", "ALT", "ALKPHOS", "BILITOT", "PROT", "ALBUMIN" in the last 168 hours. No results for input(s): "LIPASE", "AMYLASE" in the last 168 hours. No results for input(s): "AMMONIA" in the last 168 hours. CBC: Recent Labs  Lab 05/09/22 0534 05/10/22 0158 05/11/22 0211 05/12/22 0201  WBC 8.3 9.9 11.5* 9.9  HGB 13.4 13.3 12.4 12.4  HCT 41.9 39.7 39.0 38.2  MCV 95.0 93.0 95.1 94.6  PLT 238 220 199 190   Cardiac Enzymes: No results for input(s): "CKTOTAL", "CKMB", "CKMBINDEX", "TROPONINI" in the last 168 hours. BNP: BNP (last 3 results) No results for input(s): "BNP" in the last 8760 hours.  ProBNP (last 3 results) No results for input(s): "PROBNP" in the last 8760 hours.  CBG: Recent Labs  Lab 05/11/22 1612 05/11/22 2114 05/12/22 0816 05/12/22 1323 05/12/22 1556  GLUCAP 162* 173* 148* 116* 206*       Signed:  Fayrene Helper MD.  [facility] 05/12/2022, 7:05 PM

## 2022-05-13 LAB — LIPOPROTEIN A (LPA)
Lipoprotein (a): <8.4 nmol/L (ref <75.0)
Lipoprotein (a): 9 nmol/L (ref <75.0)

## 2022-05-15 ENCOUNTER — Encounter (HOSPITAL_COMMUNITY): Payer: Self-pay | Admitting: Cardiology

## 2022-05-16 NOTE — Progress Notes (Signed)
Cardiology Office Note:    Date:  05/18/2022   ID:  Virginia Huber, DOB 26-Apr-1946, MRN [id number]  PCP:  Sharilyn Sites, [place] [facility] Cardiologist:  Rozann Lesches, MD Electrophysiologist:  Will Meredith Leeds, MD     Referring MD: Sharilyn Sites, MD   CC: Here for hospital follow-up  History of Present Illness:    Virginia Huber is a 76 y.o. female with a hx of the following:  PAD HTN CAD, s/p angioplasty to OM1 in 2016, DES to Cx in 2016, and DES to RCA in 2017 PAF, on Eliquis HFpEF OSA CKD stage 3b (Follows Nephrology) SSS, s/p Medtronic PPM in 2018 Orthostatic hypotension, hx of fall  Was seen by Dr. Domenic Polite in February 2023 and was reportedly doing very well.  Was seen by Amie Portland, PA-C in May 2023 and patient reported dizziness after walking short distances.  She had previous orthostatic blood pressure readings and Dr. Delanna Ahmadi office but did not have any at the cardiology office.  Her diltiazem was reduced from 360 to 180 mg daily and Lasix at 40 mg daily.  At follow-up in June 2023 she was still reportedly extremely dizzy.  Kidney doctor stopped hydralazine and blood pressure was continuing to run low.  Blood pressure log revealed SBP's in 70 systolic.  Lasix, olmesartan, and potassium were stopped at that office visit.  The next week, she saw Amie Portland, PA-C back in the office and reported dizziness was better, but was still having some occasionally.  Diltiazem was stopped completely and continued on low-dose Coreg.  She presented to the ED in July 2023 after a fall, was found to have mildly displaced fracture of distal shaft of the fifth metatarsal.  She awoke at 4 AM with leg cramps and got up to take a Tylenol, became dizzy, laid her head on the counter, turned and fell and broke her foot, stated she did not think she passed out.  BP was averaging [id number] systolic with diastolic blood pressure readings around 70s. Coreg reduced to  6.25 mg BID at follow-up with Estella Husk, PA-C. Following Ortho.   Seen by Dr. Domenic Polite in September 2023.  Was reportedly doing much better, did not have any recurrent events since last OV.  Device interrogation in July 2023 revealed well under 1% AF/AT burden.  She denied any palpitations at this office visit.  She saw Dr. Curt Bears later that month and did report dizziness a few times per week, that tended to occur when going from seated to standing position, but also occurred at other times as well.  She had been holding medicines at that time, and somewhat helped her symptoms but did not completely improve her symptoms.  Carvedilol was stopped for the next 2 weeks.  Dr. Curt Bears said if this made a difference, this medication would continue to be held, if it did not make a difference medication will be restarted.  Systolic blood pressures later on were averaging 170s, was referred to hypertension clinic with Pharm.D. for further discussion and evaluation.  She also noted via nurse triage note that her tremors were worsening on amiodarone, but she is willing to stay on it if Multaq was unaffordable.  Was evaluated at hypertension clinic with Cammy Copa on April 17, 2022.  Her readings on her home cuff revealed SBP 150, 163 with manual BP reading in office that day averaging 130s.  Carvedilol was continued as 6.25 mg twice daily, olmesartan was increased from 5  mg to 10 mg daily.  Patient was encouraged to buy new BP cuff for home and was told to follow-up with Pharm.D. in 5 weeks.  She presented to ED with substernal chest pain on May 09, 2022.  She stated that this pain woke her up out of her sleep in the early morning, also had some shortness of breath with diaphoresis, denied any syncope or dizziness.  She had a mechanical fall about 5 days prior to this admission and she went to urgent care on November 16 due to left-sided rib pain, x-rays of her ribs at that time were negative for any  fractures.  She continued to have pleuritic rib pain on the left side.  Troponin 20 >> 24.  Chest x-ray negative.  EKG revealed sinus tach, with IVCD.  Cardiology consulted, Dr. Stanford Breed recommended transfer to [facility] for evaluation. Received 2 SL NG with relief of her CP.   She underwent left heart cath on May 11, 2022 that revealed mild to moderate diffuse disease of entire LAD without focal stenosis, patent stent of proximal circumflex, with chronic occlusion of small caliber first obtuse marginal branch.  Patent stent of posterolateral artery, small caliber distal posterolateral artery beyond the stented segment had moderately severe disease at bifurcation but these vessels were too small for PCI, normal LVEDP.  Medical management was recommended for CAD, with consideration for adding Ranexa or Imdur in setting of diffuse small vessel disease.  TTE revealed EF 40 to 45%, regional wall motion abnormalities of left ventricle noted with severe concentric left ventricular hypertrophy, trivial MR, aortic valve sclerosis present without aortic valve stenosis.  She had TEE with [facility] on 11/21 and had successful cardioversion of A-fib to a paced rhythm. TEE revealed EF 50-55%, severe LVH. Coreg increased to 12.5 mg BID.    Today she presents for follow-up. She states she is not doing well. She can tell she is back in A-fib.  Chief complaint is weakness and shortness of breath since last Friday.  Was doing well after hospital discharge until last Friday (05/15/22), when she noted she was back in A-fib, as her breathing was worse, stated she felt more short of breath.  She says she can tell she has been in A-fib ever since.  She does note pain throughout chest that she says she experiences when she is in A-fib as well.  Says this is noticed when her heart rate goes fast, described as an ache, sometimes can last hours long in duration. She says chest pain is not as bad as previously, states she has not taken  nitroglycerin recently. Denies any recent syncope, presyncope, dizziness, orthopnea, PND, swelling, significant weight changes, acute bleeding, or claudication. Denies any other questions or concerns.    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 h/o PAF/flutter during admission in 2013 for PNA. b. PAF during adm for NSTEMI 07/2015, subsequent paroxysms since then.   B12 deficiency anemia    Branch retinal vein occlusion with macular edema of right eye 10/11/2019   Chronic renal failure, stage 3b ([place]) 09/23/2017   Coronary artery disease 11/30/2014   a. remote MI. b. h/o PTCA with scoring balloon to OM1 11/2014. c. NSTEMI  03/2015 s/p DES to prox-mid Cx. d. NSTEMI 07/2015 s/p scoring balloon/PTCA/DES to dRCA with PAF during that admission   Cutaneous lupus erythematosus    Early stage nonexudative age-related macular degeneration of right eye 02/27/2020   Essential hypertension    GERD (gastroesophageal reflux disease)    History of blood transfusion 1980's   2nd surgical procedures   Hypercholesteremia    Hypothyroidism    Myocardial infarction ([place]) 02/2012   NSTEMI (non-ST elevated myocardial infarction) ([place]) 04/02/2015   OSA (obstructive sleep apnea) 05/13/2016   Ovarian tumor    PAD (peripheral artery disease) ([place])    a. s/p LE angio 2015; followed by Dr. Fletcher Anon - managed medically.   Pericardial effusion    a. 06/2016 after ppm - s/p pericardiocentesis.   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 s/p Medtronic PPM 06/2016, c/b lead perf/pericardial effusion.   TIA (transient ischemic attack)    Type 2 diabetes with nephropathy ([place]) 02/29/2012    Past Surgical History:  Procedure Laterality Date   ABDOMINAL AORTAGRAM N/A 01/03/2014   Procedure: ABDOMINAL [name];  Surgeon: Wellington Hampshire, MD;  Location: [place] CATH LAB;  Service: Cardiovascular;  Laterality: N/A;   ABDOMINAL HYSTERECTOMY  06/22/1970   "partial"   APPENDECTOMY  02/20/1969   CARDIAC CATHETERIZATION  06/22/2006   Tiny OM-2 with 90% narrowing. Med tx.   CARDIAC CATHETERIZATION N/A 11/30/2014   Procedure: Left Heart Cath and Coronary Angiography;  Surgeon: Troy Sine, MD; LAD 20%, CFX 50%, OM1 95%, right PLB 30%, LV normal    CARDIAC CATHETERIZATION N/A 11/30/2014   Procedure: Coronary Balloon Angioplasty;  Surgeon: Troy Sine, MD;  Angiosculpt scoring balloon and PTCA to the OM1 reducing stenosis from 95% to less than 10%   CARDIAC CATHETERIZATION N/A 04/03/2015   Procedure: Left Heart Cath and Coronary Angiography;  Surgeon: Jolaine Artist, MD; dLAD 50%, CFX 90%, OM1 100%, PLA 15%, LVEDP 13     CARDIAC CATHETERIZATION N/A 04/03/2015   Procedure: Coronary Stent Intervention;  Surgeon: Sherren Mocha, MD; 3.0x18 mm Xience DES to the CFX     CARDIAC CATHETERIZATION N/A 08/02/2015   Procedure: Left Heart Cath and Coronary Angiography;  Surgeon: Troy Sine, MD;  Location: [place] CV LAB;  Service: Cardiovascular;  Laterality: N/A;   CARDIAC CATHETERIZATION N/A 08/02/2015   Procedure: Coronary Stent Intervention;  Surgeon: Troy Sine, MD;  Location: [place] CV LAB;  Service: Cardiovascular;  Laterality: N/A;   CARDIAC CATHETERIZATION N/A 06/25/2016   Procedure: Pericardiocentesis;  Surgeon: Will Meredith Leeds, MD;   Location: [place] CV LAB;  Service: Cardiovascular;  Laterality: N/A;   cardiac stents     CARDIOVERSION N/A 12/15/2017   Procedure: CARDIOVERSION;  Surgeon: Acie Fredrickson, Wonda Cheng, MD;  Location: [facility] ENDOSCOPY;  Service: Cardiovascular;  Laterality: N/A;   CARDIOVERSION N/A 05/12/2022   Procedure: CARDIOVERSION;  Surgeon: Sueanne Margarita, MD;  Location: [facility] ENDOSCOPY;  Service: Cardiovascular;  Laterality: N/A;   CHOLECYSTECTOMY OPEN  02/20/1989   COLONOSCOPY  06/23/2003   Dr. Laural Golden: pancolonic divericula, polyp, path unknown currently   COLONOSCOPY  06/22/2010   Dr. Oneida Alar: Normal  TI, scattered diverticula in entire colon, small internal hemorrhoids, normal colon biopsies. Colonoscopy in 5-10 years.    COLONOSCOPY WITH PROPOFOL N/A 06/02/2021   pancolonic diverticulosis. Two 4-6 mm polyps in transverse colon. Sessile serrated and hyperplastic. 5 year surveillance if benefits outweigh the risks.   COLOSTOMY  05/23/1979   COLOSTOMY REVERSAL  11/21/1979   EP IMPLANTABLE DEVICE N/A 06/25/2016   Procedure: Lead Revision/Repair;  Surgeon: Will Meredith Leeds, MD;  Location: [place] CV LAB;  Service: Cardiovascular;  Laterality: N/A;   EP IMPLANTABLE DEVICE N/A 06/25/2016   Procedure: Pacemaker Implant;  Surgeon: Will Meredith Leeds, MD;  Location: [place] CV LAB;  Service: Cardiovascular;  Laterality: N/A;   EXCISIONAL HEMORRHOIDECTOMY  02/20/1969   EYE SURGERY Left 06/22/1998   "branch vein occlusion"   EYE SURGERY Left ~ 2001   "smoothed out wrinkle"   LEFT HEART CATH AND CORONARY ANGIOGRAPHY N/A 05/11/2022   Procedure: LEFT HEART CATH AND CORONARY ANGIOGRAPHY;  Surgeon: Burnell Blanks, MD;  Location: [place] CV LAB;  Service: Cardiovascular;  Laterality: N/A;   LEFT OOPHORECTOMY  05/23/1979   nicked bowel, peritonitis, colostomy; colostomy reversed 1981    LOWER EXTREMITY ANGIOGRAM N/A 01/03/2014   Procedure: LOWER EXTREMITY ANGIOGRAM;  Surgeon: Wellington Hampshire, MD;   Location: [place] CATH LAB;  Service: Cardiovascular;  Laterality: N/A;   Nuclear med stress test  10/21/2011   Small area of mild ischemia inferoapically.   PARTIAL HYSTERECTOMY  02/20/1969   left ovaries, then ovaries removed later due tumors    POLYPECTOMY  06/02/2021   Procedure: POLYPECTOMY;  Surgeon: Eloise Harman, DO;  [facility];  Service: Endoscopy;;   right eye surgery  03/2021   RIGHT OOPHORECTOMY  02/20/1969   TEE WITHOUT CARDIOVERSION N/A 05/12/2022   Procedure: TRANSESOPHAGEAL ECHOCARDIOGRAM (TEE);  Surgeon: Sueanne Margarita, MD;  Location: [facility] ENDOSCOPY;  Service: Cardiovascular;  Laterality: N/A;    Current Medications: Current Meds  Medication Sig   acetaminophen (TYLENOL) 325 MG tablet Take 650 mg by mouth every 6 (six) hours as needed for headache.   apixaban (ELIQUIS) 5 MG TABS tablet TAKE ONE TABLET BY MOUTH TWICE DAILY   atorvastatin (LIPITOR) 80 MG tablet TAKE ONE TABLET BY MOUTH EVERY DAY AT 6:00PM (Patient taking differently: Take 80 mg by mouth daily.)   calcitRIOL (ROCALTROL) 0.25 MCG capsule Take 0.25 mcg by mouth 3 (three) times a week.   carvedilol (COREG) 6.25 MG tablet Take 2 tablets (12.5 mg total) by mouth 2 (two) times daily.   Cholecalciferol (VITAMIN D3 PO) Take 2,000 Units by mouth every evening.   clopidogrel (PLAVIX) 75 MG tablet TAKE ONE TABLET ('75MG'$  TOTAL) BY MOUTH DAILY   Continuous Blood Gluc Receiver (FREESTYLE LIBRE 2 READER) DEVI 1 each by Does not apply route daily.   Continuous Blood Gluc Sensor (FREESTYLE LIBRE 2 SENSOR) MISC USE ONE FOR FOURTEEN DAYS   glucose blood (FREESTYLE PRECISION NEO TEST) test strip Use as instructed   Insulin NPH, Human,, Isophane, (NOVOLIN N FLEXPEN) 100 UNIT/ML Kiwkpen INJECT 25 UNITS in THE MORNING AND 15 UNITS BEFORE dinner   levothyroxine (SYNTHROID) 112 MCG tablet TAKE ONE TABLET BY MOUTH ONCE DAILY   rOPINIRole (REQUIP) 0.5 MG tablet Take 0.5 mg by mouth at bedtime.    Semaglutide, 2 MG/DOSE, 8  MG/3ML SOPN Inject 2 mg as directed once a week. (Patient taking differently: Inject 2 mg as directed once a week. Wednesday)   amiodarone (PACERONE) 200 MG tablet Take  0.5 tablets (100 mg total) by mouth daily.   nitroGLYCERIN (NITROSTAT) 0.4 MG SL tablet Place 1 tablet (0.4 mg total) under the tongue every 5 (five) minutes as needed for chest pain.    olmesartan (BENICAR) 5 MG tablet Take 5 mg by mouth in the morning and at bedtime.     Allergies:   Penicillins and Percocet [oxycodone-acetaminophen]   Social History   Socioeconomic History   Marital status: Married    Spouse name: Not on file   Number of children: Not on file   Years of education: Not on file   Highest education level: Not on file  Occupational History   Occupation: Retired    Fish farm manager: RETIRED    Comment: insurance billing  Tobacco Use   Smoking status: Never    Passive exposure: Never   Smokeless tobacco: Never   Tobacco comments:    Never smoked  Vaping Use   Vaping Use: Never used  Substance and Sexual Activity   Alcohol use: No    Alcohol/week: 0.0 standard drinks of alcohol   Drug use: No   Sexual activity: Never    Birth control/protection: Surgical    Comment: hyst  Other Topics Concern   Not on file  Social History Narrative   Not on file   Social Determinants of Health   Financial Resource Strain: Not on file  Food Insecurity: No Food Insecurity (05/10/2022)   Hunger Vital Sign    Worried About Running Out of Food in the Last Year: Never true    Ran Out of Food in the Last Year: Never true  Transportation Needs: No Transportation Needs (05/10/2022)   PRAPARE - Hydrologist (Medical): No    Lack of Transportation (Non-Medical): No  Physical Activity: Not on file  Stress: Not on file  Social Connections: Not on file     Family History: The patient's family history includes Diabetes in her brother; Heart disease in her brother, brother, father, mother, and  sister; Lupus in her daughter; Thyroid disease in her brother and brother. There is no history of Colon cancer.  ROS:   Review of Systems  Constitutional: Negative.   HENT: Negative.    Eyes: Negative.   Respiratory:  Positive for shortness of breath. Negative for cough, hemoptysis, sputum production and wheezing.        See HPI.   Cardiovascular:  Positive for chest pain and palpitations. Negative for orthopnea, claudication, leg swelling and PND.       See HPI.   Gastrointestinal: Negative.   Genitourinary: Negative.   Musculoskeletal: Negative.   Skin: Negative.   Neurological:  Positive for tremors and weakness. Negative for dizziness, tingling, sensory change, speech change, focal weakness, seizures, loss of consciousness and headaches.       See HPI.   Endo/Heme/Allergies: Negative.   Psychiatric/Behavioral: Negative.      Please see the history of present illness.    All other systems reviewed and are negative.  EKGs/Labs/Other Studies Reviewed:    The following studies were reviewed today:   EKG:  EKG is ordered today.  The ekg ordered today demonstrates A-fib/A-flutter, 105 bpm, with nonspecific IVCD, otherwise no acute ischemic changes.   TEE on 05/12/2022: 1. Left ventricular ejection fraction, by estimation, is 50 to 55%. The  left ventricle has low normal function. The left ventricle has no regional  wall motion abnormalities. There is severe concentric left ventricular  hypertrophy.   2. Right ventricular  systolic function is normal. The right ventricular  size is normal.   3. Left atrial size was mildly dilated. No left atrial/left atrial  appendage thrombus was detected. The LAA emptying velocity was 16 cm/s.   4. The mitral valve is normal in structure. Mild mitral valve  regurgitation. No evidence of mitral stenosis.   5. The aortic valve is tricuspid. Aortic valve regurgitation is not  visualized. Aortic valve sclerosis/calcification is present, without  any  evidence of aortic stenosis.   6. The inferior vena cava is normal in size with greater than 50%  respiratory variability, suggesting right atrial pressure of 3 mmHg.   Conclusion(s)/Recommendation(s): Normal biventricular function without  evidence of hemodynamically significant valvular heart disease.   Left heart cath on 05/11/22:   Prox Cx to Mid Cx lesion is 10% stenosed.   1st Mrg lesion is 100% stenosed.   Ost LAD to Mid LAD lesion is 30% stenosed.   Mid LAD to Dist LAD lesion is 30% stenosed.   Ost RCA to Prox RCA lesion is 40% stenosed.   RPAV-1 lesion is 20% stenosed.   RPAV-2 lesion is 70% stenosed.   2nd RPL lesion is 70% stenosed.   The LAD is a moderate caliber vessel that courses to the apex. There appears to be diffuse mild to moderate disease in the entire LAD without a focal stenosis.  2. The Circumflex has a patent proximal stented segment. This vessel terminates into an obtuse marginal branch. The small caliber first obtuse marginal branch is chronically occluded.  3. The RCA is a large caliber dominant vessel with a patent stent in the posterolateral artery. The small caliber distal posterolateral artery beyond the stented segment has moderately severe disease at a bifurcation but the vessels appear too small for PCI.  4. Normal LVEDP   Recommendations: Medical management of CAD. Consider adding Imdur or Ranexa for angina in setting of diffuse small vessel disease.    2D Echo complete on 05/10/2022: 1. Severe hypokinesis of the anteroseptal wall with overall mild LV  dysfunction.   2. Left ventricular ejection fraction, by estimation, is 40 to 45%. The  left ventricle has mildly decreased function. The left ventricle  demonstrates regional wall motion abnormalities (see scoring  diagram/findings for description). There is severe  concentric left ventricular hypertrophy. Left ventricular diastolic  parameters are indeterminate.   3. Right ventricular  systolic function is normal. The right ventricular  size is normal.   4. The mitral valve is normal in structure. Trivial mitral valve  regurgitation. No evidence of mitral stenosis.   5. The aortic valve is tricuspid. Aortic valve regurgitation is not  visualized. Aortic valve sclerosis is present, with no evidence of aortic  valve stenosis.   6. The inferior vena cava is normal in size with greater than 50%  respiratory variability, suggesting right atrial pressure of 3 mmHg.  Lexiscan on 01/20/2018: Downsloping ST segment depression ST segment depression of 0.5 mm was noted during stress in the II, III, aVF, V5 and V6 leads. Defect 1: There is a small defect of mild severity present in the apical anterior and apical septal location. This likely represents soft tissue attenuation. A small degree of scar cannot entirely be excluded. No signficant ischemia. This is a low risk study. Nuclear stress EF: 64%.  Left heart cath on 08/02/2015: 1st RPLB lesion, 50% stenosed. Dist RCA lesion, 30% stenosed. Ost 1st Mrg to 1st Mrg lesion, 100% stenosed. Ost LAD lesion, 40% stenosed. Mid LAD  lesion, 40% stenosed. Post Atrio lesion, 90% stenosed. Post intervention, there is a 0% residual stenosis. The left ventricular systolic function is normal. Mid RCA lesion, 20% stenosed.   Normal LV function without focal segmental wall motion abnormalities and ejection fraction of 55%.   Significant coronary obstructive disease with diffuse mild luminal narrowing of the LAD with 40% proximal and mid stenoses; widely patent stent in the proximal circumflex with old occlusion of the marginal branch which had arisen in the region of the stented segment but with evidence for very faint collateralization to this distal marginal vessel from the the LAD; a large dominant RCA with 20%, mild mid narrowing, 30% narrowing after the acute margin and focal 90% stenosis distally prior to a PDA 2 vessel with 50% narrowing at the  ostium of this PDA prior to a bend in the vessel with the RCA ending in the PLA vessel.   Successful percutaneous cardiac intervention to the distal RCA treated with Angiosculpt scoring balloon, PTCA, and ultimate stenting with a 2.58 mm Xience Alpine DES stent postdilated to 2.51 mm with a 90% stenosis reduced to 0% and no change in the ostial PDA narrowing.   RECOMMENDATION: Since the patient suffered a non-ST segment elevation myocardial infarction on Plavix, Brilinta was administered for oral antiplatelet therapy.  It was felt most likely the patient's atrial fibrillation was ischemic mediated.  If anticoagulation is necessary, Brilinta will need to be discontinued and changed back to Plavix due to potential bleed risk if  triple therapy is needed short-term.  Recent Labs: 03/17/2022: ALT 27; TSH 0.571 05/10/2022: Magnesium 1.8 05/12/2022: BUN 18; Creatinine, Ser 1.30; Hemoglobin 12.4; Platelets 190; Potassium 3.9; Sodium 139  Recent Lipid Panel    Component Value Date/Time   CHOL 96 05/10/2022 0158   TRIG 156 (H) 05/10/2022 0158   HDL 30 (L) 05/10/2022 0158   CHOLHDL 3.2 05/10/2022 0158   VLDL 31 05/10/2022 0158   LDLCALC 35 05/10/2022 0158   LDLCALC 96 08/05/2017 0807     Risk Assessment/Calculations:    CHA2DS2-VASc Score = 6  This indicates a 9.7% annual risk of stroke. The patient's score is based upon: CHF History: 1 HTN History: 1 Diabetes History: 0 Stroke History: 0 Vascular Disease History: 1 Age Score: 2 Gender Score: 1   Physical Exam:    VS:  BP 108/72 (BP Location: Left Arm, Patient Position: Sitting, Cuff Size: Normal)   Pulse (!) 102   Ht '5\' 3"'$  (1.6 m)   Wt 137 lb 9.6 oz (62.4 kg)   SpO2 100%   BMI 24.37 kg/m     Wt Readings from Last 3 Encounters:  05/18/22 137 lb 9.6 oz (62.4 kg)  05/09/22 134 lb 7.7 oz (61 kg)  05/07/22 134 lb 8 oz (61 kg)     GEN: Well nourished, well developed in no acute distress HEENT: Normal NECK: No JVD; No carotid  bruits CARDIAC: S1.S2, irregular rate and fast rhythm, no murmurs, rubs, gallops; 2+ peripheral pulses throughout, strong and equal bilaterally RESPIRATORY:  Clear and diminished to auscultation without rales, wheezing or rhonchi  MUSCULOSKELETAL:  No edema; No deformity  SKIN: Thin skin, warm and dry NEUROLOGIC:  Alert and oriented x 3, mild postural tremors noted along arms bilaterally PSYCHIATRIC:  Normal affect   ASSESSMENT:    1. PAF (paroxysmal atrial fibrillation) ([place])   2. Medication management   3. CAD in native artery   4. PAD (peripheral artery disease) ([place])   5. Chronic heart  failure with preserved ejection fraction (HFpEF) ([place])   6. Hypertension, unspecified type   7. Sick sinus syndrome ([place])   8. S/P placement of cardiac pacemaker   9. Orthostatic hypotension   10. OSA on CPAP    PLAN:    In order of problems listed above:  PAF, long term anticoagulation, medication management Does admit to recurrence of A-fib since last Friday, does experience palpitations with associated symptom of shortness of breath and chest pain.  Twelve-lead EKG reveals A-fib/a flutter, 105 bpm, nonspecific intraventricular conduction delay, without acute ischemic changes.  Will increase amiodarone to 200 mg twice daily x 1 week, then reduce to 200 mg daily thereafter.  Will reduce olmesartan from 10 mg daily to 5 mg daily.  CHA2DS2-VASc score is 6.  She has been compliant with Eliquis and has not skipped any doses, denies any bleeding issues.  Currently she is on appropriate dosage of this medication.  Will obtain BMET to reevaluate kidney function, if weight continues to decline and kidney function continues to increase, plan to decrease Eliquis to 2.5 mg twice daily.  Continue Eliquis 5 mg twice daily.  Scheduled for ablation next February, however will get her into see EP earlier and we will also place referral to A-fib clinic.  Discussed avoiding triggers to A-fib.  Continue rest of current  medication regimen.  CAD, s/p angioplasty to OM1 in 2016, DES to circumflex in 2016, and DES to RCA in 2017 Does admit to associated symptom of atypical chest pain with her palpitations.  Recent left heart cath revealed mild to moderate dx in entire LAD without a focal stenosis. Small caliber of first OM branch was found to be chronically occluded.  The small caliber distal posterolateral artery beyond the stented segment was found to have moderately severe disease at the bifurcation, but the vessels appear to be too small for PCI.  Medical management was recommended for CAD.  Consideration for adding Ranexa or Imdur for angina in the setting of diffuse small vessel disease.  It appears as though her atypical CP is related to palpitations. Will medically manage her palpitations with medication changes as mentioned above.  If chest pain does not resolve by next OV, plan to initiate low dose Ranexa at next OV, as I do not believe her BP currently could tolerate Imdur. Will refill NG. Continue Lipitor, carvedilol, Plavix, and reduce olmesartan to 5 mg daily.  ED precautions discussed. Heart healthy diet and regular cardiovascular exercise encouraged.   3. PAD ABI's in 2015 revealed moderate depression of resting left ABI to 0.66, segmental analysis revealed pattern of tibial and potentially distal SFA/popliteal disease with digital disease also suspected; there is no evidence of significant arterial occlusive disease in right lower extremity.  She denies any recent claudication symptoms.  Continue current medication regimen.  4. Chronic HFpEF 2D complete echo on 11/19 revealed EF 40 to 45% with severe hypokinesis of the anteroseptal wall with overall mild LV dysfunction noted, severe concentric LVH noted as well.  However, TEE with cardioversion revealed EF 50 to 55%, no RWMA noted, severe concentric LVH noted. Euvolemic and well compensated on exam. Low sodium diet, fluid restriction <2L, and daily weights  encouraged. Educated to contact our office for weight gain of 2 lbs overnight or 5 lbs in one week.  Continue carvedilol. Heart healthy diet and regular cardiovascular exercise encouraged. Consider SGLT2i/spironolactone in future if EF continues to decline for GDMT.   5. Hypertension Blood pressure today 108/72.  Recent  labile BP readings at home.  Will reduce olmesartan as mentioned above. Discussed to monitor BP at home at least 2 hours after medications and sitting for 5-10 minutes.  BP log given today.  Continue rest of medication regimen.  Will obtain BMET as mentioned above. Heart healthy diet and regular cardiovascular exercise encouraged.   6. SSS, s/p Pacemaker Remote device check in October 2023 revealed 6 AT/AF episodes, the longest lasted 44 minutes, burden 0.3%, normal device function, battery and lead parameters are stable.  Will have patient follow-up with Dr. Curt Bears within the next few weeks and refer patient to A-fib clinic as mentioned above.   7. Orthostatic hypotension Denies any recent symptoms.  Will reduce olmesartan as mentioned above and increase amiodarone as mentioned above.  Continue rest of current medication regimen.  Will obtain BMET and CBC.   8. OSA on CPAP Does report wearing CPAP. States she is being set up for another sleep study. Continue to follow with PCP.  9.  Disposition: Follow-up with me or Dr. Domenic Polite in 6 to 8 weeks or sooner if anything changes.  Follow-up with Dr. Curt Bears within 4 to 5 weeks and we will place referral to A-fib clinic.     Medication Adjustments/Labs and Tests Ordered: Current medicines are reviewed at length with the patient today.  Concerns regarding medicines are outlined above.  Orders Placed This Encounter  Procedures   Basic metabolic panel   CBC   EKG 12-Lead   Meds ordered this encounter  Medications   amiodarone (PACERONE) 200 MG tablet    Sig: Take 1 tablet (200 mg total) by mouth 2 (two) times daily for 7 days,  THEN 1 tablet (200 mg total) daily.    Dispense:  45 tablet    Refill:  1    05/18/22 Dose Increase   olmesartan (BENICAR) 5 MG tablet    Sig: Take 1 tablet (5 mg total) by mouth daily.    Dispense:  90 tablet    Refill:  1    05/18/22 Dose Decrease   DISCONTD: nitroGLYCERIN (NITROSTAT) 0.4 MG SL tablet    Sig: Place 1 tablet (0.4 mg total) under the tongue every 5 (five) minutes x 3 doses as needed for chest pain (If not relief after 3rd dose, call 911 or go to ED).    Dispense:  25 tablet    Refill:  0   nitroGLYCERIN (NITROSTAT) 0.4 MG SL tablet    Sig: Place 1 tablet (0.4 mg total) under the tongue every 5 (five) minutes x 3 doses as needed for chest pain (If not relief after 3rd dose, call 911 or go to ED).    Dispense:  25 tablet    Refill:  0    Patient Instructions  Medication Instructions:  Your physician has recommended you make the following change in your medication:  Decrease olmasartan to 5 mg once a day Increase amiodarone to 200 mg twice a day for 7 days, then 200 mg once a day Continue all other medications as directed.  Labwork: BMET, CBC (2 weeks at [place])  Testing/Procedures: none  Follow-Up:  Your physician recommends that you schedule a follow-up appointment in: 6-8 weeks  Any Other Special Instructions Will Be Listed Below (If Applicable).  You have been referred to Electrophysiology and the Afib Clinic  If you need a refill on your cardiac medications before your next appointment, please call your pharmacy.    SignedFinis Bud, NP  05/18/2022 9:15 PM  [place]

## 2022-05-18 ENCOUNTER — Encounter: Payer: Self-pay | Admitting: Nurse Practitioner

## 2022-05-18 ENCOUNTER — Ambulatory Visit: Payer: HMO | Attending: Nurse Practitioner | Admitting: Nurse Practitioner

## 2022-05-18 VITALS — BP 108/72 | HR 102 | Ht 63.0 in | Wt 137.6 lb

## 2022-05-18 DIAGNOSIS — I480 Paroxysmal atrial fibrillation: Secondary | ICD-10-CM

## 2022-05-18 DIAGNOSIS — I10 Essential (primary) hypertension: Secondary | ICD-10-CM

## 2022-05-18 DIAGNOSIS — I2510 Atherosclerotic heart disease of native coronary artery without angina pectoris: Secondary | ICD-10-CM

## 2022-05-18 DIAGNOSIS — I739 Peripheral vascular disease, unspecified: Secondary | ICD-10-CM

## 2022-05-18 DIAGNOSIS — I951 Orthostatic hypotension: Secondary | ICD-10-CM

## 2022-05-18 DIAGNOSIS — G4733 Obstructive sleep apnea (adult) (pediatric): Secondary | ICD-10-CM

## 2022-05-18 DIAGNOSIS — I5032 Chronic diastolic (congestive) heart failure: Secondary | ICD-10-CM

## 2022-05-18 DIAGNOSIS — Z950 Presence of cardiac pacemaker: Secondary | ICD-10-CM

## 2022-05-18 DIAGNOSIS — I495 Sick sinus syndrome: Secondary | ICD-10-CM

## 2022-05-18 DIAGNOSIS — Z79899 Other long term (current) drug therapy: Secondary | ICD-10-CM

## 2022-05-18 MED ORDER — AMIODARONE HCL 200 MG PO TABS: ORAL_TABLET | ORAL | 1 refills | Status: DC

## 2022-05-18 MED ORDER — NITROGLYCERIN 0.4 MG SL SUBL: 0.4000 mg | SUBLINGUAL_TABLET | SUBLINGUAL | 0 refills | Status: DC | PRN

## 2022-05-18 MED ORDER — NITROGLYCERIN 0.4 MG SL SUBL: 0.4000 mg | SUBLINGUAL_TABLET | SUBLINGUAL | 0 refills | Status: AC | PRN

## 2022-05-18 MED ORDER — OLMESARTAN MEDOXOMIL 5 MG PO TABS: 5.0000 mg | ORAL_TABLET | Freq: Every day | ORAL | 1 refills | Status: DC

## 2022-05-18 NOTE — Patient Instructions (Addendum)
Medication Instructions:  Your physician has recommended you make the following change in your medication:  Decrease olmasartan to 5 mg once a day Increase amiodarone to 200 mg twice a day for 7 days, then 200 mg once a day Continue all other medications as directed.  Labwork: BMET, CBC (2 weeks at [place])  Testing/Procedures: none  Follow-Up:  Your physician recommends that you schedule a follow-up appointment in: 6-8 weeks  Any Other Special Instructions Will Be Listed Below (If Applicable).  You have been referred to Electrophysiology and the Afib Clinic  If you need a refill on your cardiac medications before your next appointment, please call your pharmacy.

## 2022-05-20 NOTE — Addendum Note (Signed)
Addended by: Sung Amabile on: 05/20/2022 02:13 PM   Modules accepted: Orders

## 2022-05-28 LAB — BASIC METABOLIC PANEL
BUN/Creatinine Ratio: 10 — ABNORMAL LOW (ref 12–28)
BUN: 14 mg/dL (ref 8–27)
CO2: 22 mmol/L (ref 20–29)
Calcium: 8.6 mg/dL — ABNORMAL LOW (ref 8.7–10.3)
Chloride: 106 mmol/L (ref 96–106)
Creatinine, Ser: 1.45 mg/dL — ABNORMAL HIGH (ref 0.57–1.00)
Glucose: 90 mg/dL (ref 70–99)
Potassium: 4 mmol/L (ref 3.5–5.2)
Sodium: 144 mmol/L (ref 134–144)
eGFR: 37 mL/min/{1.73_m2} — ABNORMAL LOW (ref >59)

## 2022-05-28 LAB — CBC
Hematocrit: 38.7 % (ref 34.0–46.6)
Hemoglobin: 12.2 g/dL (ref 11.1–15.9)
MCH: 29.9 pg (ref 26.6–33.0)
MCHC: 31.5 g/dL (ref 31.5–35.7)
MCV: 95 fL (ref 79–97)
Platelets: 320 10*3/uL (ref 150–450)
RBC: 4.08 x10E6/uL (ref 3.77–5.28)
RDW: 14.8 % (ref 11.7–15.4)
WBC: 9.4 10*3/uL (ref 3.4–10.8)

## 2022-05-29 ENCOUNTER — Ambulatory Visit: Payer: HMO | Admitting: Student

## 2022-05-29 ENCOUNTER — Ambulatory Visit: Payer: HMO

## 2022-06-02 ENCOUNTER — Ambulatory Visit: Payer: PPO | Attending: Cardiology | Admitting: Cardiology

## 2022-06-02 ENCOUNTER — Encounter: Payer: Self-pay | Admitting: Cardiology

## 2022-06-02 VITALS — BP 130/74 | HR 85 | Ht 63.0 in | Wt 136.4 lb

## 2022-06-02 DIAGNOSIS — I4819 Other persistent atrial fibrillation: Secondary | ICD-10-CM | POA: Diagnosis not present

## 2022-06-02 DIAGNOSIS — I495 Sick sinus syndrome: Secondary | ICD-10-CM | POA: Diagnosis not present

## 2022-06-02 DIAGNOSIS — Z79899 Other long term (current) drug therapy: Secondary | ICD-10-CM

## 2022-06-02 DIAGNOSIS — D6869 Other thrombophilia: Secondary | ICD-10-CM

## 2022-06-02 NOTE — Progress Notes (Signed)
Electrophysiology Office Note   Date:  06/02/2022   ID:  Virginia, Huber 1946-03-08, MRN [id number]  PCP:  Sharilyn Sites, MD  Cardiologist:  Bronson Ing Primary Electrophysiologist:  Joyia Riehle Meredith Leeds, MD    No chief complaint on file.     History of Present Illness: Virginia Huber is a 76 y.o. female who presents today for electrophysiology evaluation.     She has a history significant for coronary artery disease post PCI to the OM1 with STEMI in 2016, PCI to the circumflex with non-STEMI 2017, PCI to the RCA.  She has atrial fibrillation on admission.  Currently on Eliquis.  She has mild sleep apnea.  She is post Medtronic dual-chamber pacemaker implanted 06/25/2016.  She had a recent hospitalization for rapid atrial fibrillation.  She is currently on amiodarone.  She required TEE and cardioversion.  She is scheduled for ablation for atrial fibrillation February 2024.  Today, denies symptoms of palpitations, chest pain, orthopnea, PND, lower extremity edema, claudication, dizziness, presyncope, syncope, bleeding, or neurologic sequela. The patient is tolerating medications without difficulties.  She currently feels weak, fatigued, short of breath.  She is ready get back into normal rhythm.  He is finding it somewhat difficult to do her daily activities due to her fatigue.  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 h/o PAF/flutter during admission in 2013 for PNA. b. PAF during adm for NSTEMI 07/2015, subsequent paroxysms since then.   B12 deficiency anemia    Branch retinal vein occlusion with macular edema of right eye 10/11/2019   Chronic renal failure, stage 3b ([place])  09/23/2017   Coronary artery disease 11/30/2014   a. remote MI. b. h/o PTCA with scoring balloon to OM1 11/2014. c. NSTEMI 03/2015 s/p DES to prox-mid Cx. d. NSTEMI 07/2015 s/p scoring balloon/PTCA/DES to dRCA with PAF during that admission   Cutaneous lupus erythematosus    Early stage nonexudative age-related macular degeneration of right eye 02/27/2020   Essential hypertension    GERD (gastroesophageal reflux disease)    History of blood transfusion 1980's   2nd surgical procedures   Hypercholesteremia    Hypothyroidism    Myocardial infarction ([place]) 02/2012   NSTEMI (non-ST elevated myocardial infarction) ([place]) 04/02/2015   OSA (obstructive sleep apnea) 05/13/2016   Ovarian tumor    PAD (peripheral artery disease) ([place])    a. s/p LE angio 2015; followed by Dr. Fletcher Anon - managed medically.   Pericardial effusion    a. 06/2016 after ppm - s/p pericardiocentesis.   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 s/p Medtronic PPM 06/2016, c/b lead perf/pericardial effusion.   TIA (transient ischemic attack)    Type 2 diabetes with nephropathy ([place]) 02/29/2012   Past Surgical History:  Procedure Laterality Date   ABDOMINAL AORTAGRAM N/A 01/03/2014   Procedure: ABDOMINAL [name];  Surgeon: Rogue Jury  Ferne Reus, MD;  Location: [facility] CATH LAB;  Service: Cardiovascular;  Laterality: N/A;   ABDOMINAL HYSTERECTOMY  06/22/1970   "partial"   APPENDECTOMY  02/20/1969   CARDIAC CATHETERIZATION  06/22/2006   Tiny OM-2 with 90% narrowing. Med tx.   CARDIAC CATHETERIZATION N/A 11/30/2014   Procedure: Left Heart Cath and Coronary Angiography;   Surgeon: Troy Sine, MD; LAD 20%, CFX 50%, OM1 95%, right PLB 30%, LV normal    CARDIAC CATHETERIZATION N/A 11/30/2014   Procedure: Coronary Balloon Angioplasty;  Surgeon: Troy Sine, MD;  Angiosculpt scoring balloon and PTCA to the OM1 reducing stenosis from 95% to less than 10%   CARDIAC CATHETERIZATION N/A 04/03/2015   Procedure: Left Heart Cath and Coronary Angiography;  Surgeon: Jolaine Artist, MD; dLAD 50%, CFX 90%, OM1 100%, PLA 15%, LVEDP 13     CARDIAC CATHETERIZATION N/A 04/03/2015   Procedure: Coronary Stent Intervention;  Surgeon: Sherren Mocha, MD; 3.0x18 mm Xience DES to the CFX     CARDIAC CATHETERIZATION N/A 08/02/2015   Procedure: Left Heart Cath and Coronary Angiography;  Surgeon: Troy Sine, MD;  Location: [facility] CV LAB;  Service: Cardiovascular;  Laterality: N/A;   CARDIAC CATHETERIZATION N/A 08/02/2015   Procedure: Coronary Stent Intervention;  Surgeon: Troy Sine, MD;  Location: [facility] CV LAB;  Service: Cardiovascular;  Laterality: N/A;   CARDIAC CATHETERIZATION N/A 06/25/2016   Procedure: Pericardiocentesis;  Surgeon: Trevan Messman Meredith Leeds, MD;  Location: [facility] CV LAB;  Service: Cardiovascular;  Laterality: N/A;   cardiac stents     CARDIOVERSION N/A 12/15/2017   Procedure: CARDIOVERSION;  Surgeon: Acie Fredrickson, Wonda Cheng, MD;  Location: [facility] ENDOSCOPY;  Service: Cardiovascular;  Laterality: N/A;   CARDIOVERSION N/A 05/12/2022   Procedure: CARDIOVERSION;  Surgeon: Sueanne Margarita, MD;  Location: [facility] ENDOSCOPY;  Service: Cardiovascular;  Laterality: N/A;   CHOLECYSTECTOMY OPEN  02/20/1989   COLONOSCOPY  06/23/2003   Dr. Laural Golden: pancolonic divericula, polyp, path unknown currently   COLONOSCOPY  06/22/2010   Dr. Oneida Alar: Normal TI, scattered diverticula in entire colon, small internal hemorrhoids, normal colon biopsies. Colonoscopy in 5-10 years.    COLONOSCOPY WITH PROPOFOL N/A 06/02/2021   pancolonic diverticulosis. Two 4-6 mm polyps in  transverse colon. Sessile serrated and hyperplastic. 5 year surveillance if benefits outweigh the risks.   COLOSTOMY  05/23/1979   COLOSTOMY REVERSAL  11/21/1979   EP IMPLANTABLE DEVICE N/A 06/25/2016   Procedure: Lead Revision/Repair;  Surgeon: Xzavior Reinig Meredith Leeds, MD;  Location: [facility] CV LAB;  Service: Cardiovascular;  Laterality: N/A;   EP IMPLANTABLE DEVICE N/A 06/25/2016   Procedure: Pacemaker Implant;  Surgeon: Luma Clopper Meredith Leeds, MD;  Location: [facility] CV LAB;  Service: Cardiovascular;  Laterality: N/A;   EXCISIONAL HEMORRHOIDECTOMY  02/20/1969   EYE SURGERY Left 06/22/1998   "branch vein occlusion"   EYE SURGERY Left ~ 2001   "smoothed out wrinkle"   LEFT HEART CATH AND CORONARY ANGIOGRAPHY N/A 05/11/2022   Procedure: LEFT HEART CATH AND CORONARY ANGIOGRAPHY;  Surgeon: Burnell Blanks, MD;  Location: [facility] CV LAB;  Service: Cardiovascular;  Laterality: N/A;   LEFT OOPHORECTOMY  05/23/1979   nicked bowel, peritonitis, colostomy; colostomy reversed 1981    LOWER EXTREMITY ANGIOGRAM N/A 01/03/2014   Procedure: LOWER EXTREMITY ANGIOGRAM;  Surgeon: Wellington [place], MD;  Location: [facility] CATH LAB;  Service: Cardiovascular;  Laterality: N/A;   Nuclear med stress test  10/21/2011   Small area of mild ischemia inferoapically.   PARTIAL HYSTERECTOMY  02/20/1969   left ovaries, then ovaries removed later due tumors    POLYPECTOMY  06/02/2021   Procedure: POLYPECTOMY;  Surgeon: Eloise Harman, DO;  [facility];  Service: Endoscopy;;   right eye surgery  03/2021   RIGHT OOPHORECTOMY  02/20/1969   TEE WITHOUT CARDIOVERSION N/A 05/12/2022   Procedure: TRANSESOPHAGEAL ECHOCARDIOGRAM (TEE);  Surgeon: Sueanne Margarita, MD;  Location: [facility] ENDOSCOPY;  Service: Cardiovascular;  Laterality: N/A;     Current Outpatient Medications  Medication Sig Dispense Refill   acetaminophen (TYLENOL) 325 MG tablet Take 650 mg by mouth every 6 (six) hours as needed for  headache.     amiodarone (PACERONE) 200 MG tablet Take 1 tablet (200 mg total) by mouth 2 (two) times daily for 7 days, THEN 1 tablet (200 mg total) daily. 45 tablet 1   apixaban (ELIQUIS) 5 MG TABS tablet TAKE ONE TABLET BY MOUTH TWICE DAILY 60 tablet 5   atorvastatin (LIPITOR) 80 MG tablet TAKE ONE TABLET BY MOUTH EVERY DAY AT 6:00PM (Patient taking differently: Take 80 mg by mouth daily.) 90 tablet 3   calcitRIOL (ROCALTROL) 0.25 MCG capsule Take 0.25 mcg by mouth 3 (three) times a week.     carvedilol (COREG) 6.25 MG tablet Take 2 tablets (12.5 mg total) by mouth 2 (two) times daily. 120 tablet 1   Cholecalciferol (VITAMIN D3 PO) Take 2,000 Units by mouth every evening.     clopidogrel (PLAVIX) 75 MG tablet TAKE ONE TABLET ('75MG'$  TOTAL) BY MOUTH DAILY 90 tablet 3   Continuous Blood Gluc Receiver (FREESTYLE LIBRE 2 READER) DEVI 1 each by Does not apply route daily. 1 each 0   Continuous Blood Gluc Sensor (FREESTYLE LIBRE 2 SENSOR) MISC USE ONE FOR FOURTEEN DAYS 6 each 3   glucose blood (FREESTYLE PRECISION NEO TEST) test strip Use as instructed 100 each 3   Insulin NPH, Human,, Isophane, (NOVOLIN N FLEXPEN) 100 UNIT/ML Kiwkpen INJECT 25 UNITS in THE MORNING AND 15 UNITS BEFORE dinner 30 mL 1   levothyroxine (SYNTHROID) 112 MCG tablet TAKE ONE TABLET BY MOUTH ONCE DAILY 90 tablet 0   nitroGLYCERIN (NITROSTAT) 0.4 MG SL tablet Place 1 tablet (0.4 mg total) under the tongue every 5 (five) minutes x 3 doses as needed for chest pain (If not relief after 3rd dose, call 911 or go to ED). 25 tablet 0   olmesartan (BENICAR) 5 MG tablet Take 1 tablet (5 mg total) by mouth daily. 90 tablet 1   rOPINIRole (REQUIP) 0.5 MG tablet Take 0.5 mg by mouth at bedtime.   0   Semaglutide, 2 MG/DOSE, 8 MG/3ML SOPN Inject 2 mg as directed once a week. (Patient taking differently: Inject 2 mg as directed once a week. Wednesday) 9 mL 3   No current facility-administered medications for this visit.    Allergies:    Penicillins and Percocet [oxycodone-acetaminophen]   Social History:  The patient  reports that she has never smoked. She has never been exposed to tobacco smoke. She has never used smokeless tobacco. She reports that she does not drink alcohol and does not use drugs.   Family History:  The patient's family history includes Diabetes in her brother; Heart disease in her brother, brother, father, mother, and sister; Lupus in her daughter; Thyroid disease in her brother and brother.   ROS:  Please see the history of present illness.   Otherwise, review of systems is positive for none.   All other systems are reviewed and  negative.   PHYSICAL EXAM: VS:  BP 130/74   Pulse 85   Ht '5\' 3"'$  (1.6 m)   Wt 136 lb 6.4 oz (61.9 kg)   SpO2 97%   BMI 24.16 kg/m  , BMI Body mass index is 24.16 kg/m. GEN: Well nourished, well developed, in no acute distress  HEENT: normal  Neck: no JVD, carotid bruits, or masses Cardiac: irregular; no murmurs, rubs, or gallops,no edema  Respiratory:  clear to auscultation bilaterally, normal work of breathing GI: soft, nontender, nondistended, + BS MS: no deformity or atrophy  Skin: warm and dry, device site well healed Neuro:  Strength and sensation are intact Psych: euthymic mood, full affect  EKG:  EKG is ordered today. Personal review of the ekg ordered shows no fibrillation, rate 85  Personal review of the device interrogation today. Results in [place]: 03/17/2022: ALT 27; TSH 0.571 05/10/2022: Magnesium 1.8 05/27/2022: BUN 14; Creatinine, Ser 1.45; Hemoglobin 12.2; Platelets 320; Potassium 4.0; Sodium 144    Lipid Panel     Component Value Date/Time   CHOL 96 05/10/2022 0158   TRIG 156 (H) 05/10/2022 0158   HDL 30 (L) 05/10/2022 0158   CHOLHDL 3.2 05/10/2022 0158   VLDL 31 05/10/2022 0158   LDLCALC 35 05/10/2022 0158   LDLCALC 96 08/05/2017 0807     Wt Readings from Last 3 Encounters:  06/02/22 136 lb 6.4 oz (61.9 kg)  05/18/22  137 lb 9.6 oz (62.4 kg)  05/09/22 134 lb 7.7 oz (61 kg)      Other studies Reviewed: Additional studies/ records that were reviewed today include: TTE 08/04/17 Review of the above records today demonstrates:  - Left ventricle: The cavity size was normal. Wall thickness was   increased in a pattern of moderate LVH. Systolic function was   normal. The estimated ejection fraction was in the range of 55%   to 60%. Wall motion was normal; there were no regional wall   motion abnormalities. Features are consistent with a pseudonormal   left ventricular filling pattern, with concomitant abnormal   relaxation and increased filling pressure (grade 2 diastolic   dysfunction). Doppler parameters are consistent with high   ventricular filling pressure. - Left atrium: The atrium was severely dilated. - Right ventricle: Pacer wire or catheter noted in right ventricle. - Right atrium: Pacer wire or catheter noted in right atrium.  Cath  08/02/15 1st RPLB lesion, 50% stenosed. Dist RCA lesion, 30% stenosed. Ost 1st Mrg to 1st Mrg lesion, 100% stenosed. Ost LAD lesion, 40% stenosed. Mid LAD lesion, 40% stenosed. Post Atrio lesion, 90% stenosed. Post intervention, there is a 0% residual stenosis. The left ventricular systolic function is normal. Mid RCA lesion, 20% stenosed.   Normal LV function without focal segmental wall motion abnormalities and ejection fraction of 55%.   Successful percutaneous cardiac intervention to the distal RCA treated with Angiosculpt scoring balloon, PTCA, and ultimate stenting with a 2.58 mm Xience Alpine DES stent postdilated to 2.51 mm with a 90% stenosis reduced to 0% and no change in the ostial PDA narrowing.  ASSESSMENT AND PLAN:  1.  Persistent atrial fibrillation: Currently on amiodarone 200 mg daily, Eliquis 5 mg twice daily.  CHA2DS2-VASc of 7.  She feels quite poorly in atrial fibrillation.  Eller Sweis plan for ablation.  Risk, benefits, and alternatives to EP  study and radiofrequency ablation for afib were also discussed in detail today. These risks include but are not limited to stroke, bleeding,  vascular damage, tamponade, perforation, damage to the esophagus, lungs, and other structures, pulmonary vein stenosis, worsening renal function, and death. The patient understands these risk and wishes to proceed.  We Aspasia Rude therefore proceed with catheter ablation at the next available time.  Carto, ICE, anesthesia are requested for the procedure.  Chera Slivka also obtain CT PV protocol prior to the procedure to exclude LAA thrombus and further evaluate atrial anatomy.  2.  Sick sinus syndrome: Status post Medtronic dual-chamber pacemaker.  Device function appropriately.  No changes.  3.  Coronary artery disease: No current chest pain.  Continue current management  4.  Hypertension: Currently well-controlled  5.  Secondary hypercoagulable state: Currently on Eliquis for atrial fibrillation as above  6.  High risk medication monitoring: Currently on amiodarone for atrial fibrillation as above.   Current medicines are reviewed at length with the patient today.   The patient does not have concerns regarding her medicines.  None  Labs/ tests ordered today include:  Orders Placed This Encounter  Procedures   EKG 12-Lead      Disposition:   FU 5 months  Signed, Shakthi Scipio Meredith Leeds, MD  06/02/2022 10:50 AM     [facility] [address] [place] [address] [phone] (office) [phone] (fax)

## 2022-06-02 NOTE — Patient Instructions (Addendum)
Medication Instructions:  Your physician recommends that you continue on your current medications as directed. Please refer to the Current Medication list given to you today.  *If you need a refill on your cardiac medications before your next appointment, please call your pharmacy*   Lab Work: None ordered   Testing/Procedures: Your physician has recommended that you have an ablation. Catheter ablation is a medical procedure used to treat some cardiac arrhythmias (irregular heartbeats). During catheter ablation, a long, thin, flexible tube is put into a blood vessel in your groin (upper thigh), or neck. This tube is called an ablation catheter. It is then guided to your heart through the blood vessel. Radio frequency waves destroy small areas of heart tissue where abnormal heartbeats may cause an arrhythmia to start.   Someone will call you to go over instructions for procedure and TEE.  You are scheduled for 08/14/2022    Follow-Up: At [facility], you and your health needs are our priority.  As part of our continuing mission to provide you with exceptional heart care, we have created designated Provider Care Teams.  These Care Teams include your primary Cardiologist (physician) and Advanced Practice Providers (APPs -  Physician Assistants and Nurse Practitioners) who all work together to provide you with the care you need, when you need it.  Remote monitoring is used to monitor your Pacemaker or ICD from home. This monitoring reduces the number of office visits required to check your device to one time per year. It allows [place] to keep an eye on the functioning of your device to ensure it is working properly. You are scheduled for a device check from home on 07/08/2021. You may send your transmission at any time that day. If you have a wireless device, the transmission will be sent automatically. After your physician reviews your transmission, you will receive a postcard with your next  transmission date.  Your next appointment:   After your  ablation  The format for your next appointment:   In Person  Provider:   You will follow up in the [facility] located at [facility]. Your provider will be: Roderic Palau, NP or Clint R. Fenton, PA-C    Thank you for choosing [facility]!!   Trinidad Curet, RN [phone]    Other Instructions  Important Information About Sugar

## 2022-06-03 NOTE — Telephone Encounter (Signed)
spoke to the pt husband he stated he will get her to call back to schedule    HTA auth: [id number] (exp. 05/11/22 to 08/09/22)

## 2022-06-03 NOTE — Telephone Encounter (Signed)
HST- HTA Virginia Huber: [id number] (exp. 05/11/22 to 08/09/22)   Patient is scheduled at [facility] For 06/30/22 at 2 pm.  Mailed packet to the patient.

## 2022-06-04 ENCOUNTER — Ambulatory Visit: Payer: HMO | Admitting: Podiatry

## 2022-06-24 NOTE — Telephone Encounter (Signed)
I called the patient and spoke with her, she informed me she still has HTA but a new member ID number. She provider that information to me.  I started a new case with HTA for her HST.

## 2022-06-25 ENCOUNTER — Ambulatory Visit (INDEPENDENT_AMBULATORY_CARE_PROVIDER_SITE_OTHER): Payer: PPO | Admitting: Podiatry

## 2022-06-25 DIAGNOSIS — B351 Tinea unguium: Secondary | ICD-10-CM | POA: Diagnosis not present

## 2022-06-25 DIAGNOSIS — Z7901 Long term (current) use of anticoagulants: Secondary | ICD-10-CM

## 2022-06-25 DIAGNOSIS — E119 Type 2 diabetes mellitus without complications: Secondary | ICD-10-CM

## 2022-06-25 DIAGNOSIS — M79675 Pain in left toe(s): Secondary | ICD-10-CM | POA: Diagnosis not present

## 2022-06-25 DIAGNOSIS — M79674 Pain in right toe(s): Secondary | ICD-10-CM | POA: Diagnosis not present

## 2022-06-25 NOTE — Progress Notes (Signed)
  Subjective:  Patient ID: Virginia Huber, female    DOB: 06/23/1945,  MRN: [id number]  Chief Complaint  Patient presents with   foot care     [facility]    77 y.o. female presents with the above complaint. History confirmed with patient. Patient presenting with pain related to dystrophic thickened elongated nails. Patient is unable to trim own nails related to nail dystrophy and/or mobility issues. Patient does have a history of T2DM.   Objective:  Physical Exam: warm, good capillary refill nail exam onychomycosis of the toenails, onycholysis, and dystrophic nails DP pulses palpable, PT pulses palpable, and protective sensation intact Left Foot:  Pain with palpation of nails due to elongation and dystrophic growth.  Right Foot: Pain with palpation of nails due to elongation and dystrophic growth.   Assessment:   1. Pain due to onychomycosis of toenails of both feet   2. Diabetes mellitus without complication ([place])   3. Anticoagulated      Plan:  Patient was evaluated and treated and all questions answered.  #Onychomycosis with pain  -Nails palliatively debrided as below. -Educated on self-care  Procedure: Nail Debridement Rationale: Pain Type of Debridement: manual, sharp debridement. Instrumentation: Nail nipper, rotary burr. Number of Nails: 10  Return in about 3 months (around 09/24/2022) for [facility].         Everitt Amber, DPM [facility]

## 2022-06-25 NOTE — Telephone Encounter (Signed)
Updated auth information.  HST- HTA auth: [id number] (exp. 06/24/22 to 09/22/22)   Patient is scheduled at [facility] for 06/30/22 at 2 pm.

## 2022-07-01 ENCOUNTER — Ambulatory Visit: Payer: PPO | Admitting: Neurology

## 2022-07-01 DIAGNOSIS — G4733 Obstructive sleep apnea (adult) (pediatric): Secondary | ICD-10-CM | POA: Diagnosis not present

## 2022-07-01 DIAGNOSIS — G4734 Idiopathic sleep related nonobstructive alveolar hypoventilation: Secondary | ICD-10-CM

## 2022-07-02 ENCOUNTER — Encounter: Payer: Self-pay | Admitting: Internal Medicine

## 2022-07-02 DIAGNOSIS — E113411 Type 2 diabetes mellitus with severe nonproliferative diabetic retinopathy with macular edema, right eye: Secondary | ICD-10-CM | POA: Diagnosis not present

## 2022-07-02 DIAGNOSIS — H34831 Tributary (branch) retinal vein occlusion, right eye, with macular edema: Secondary | ICD-10-CM | POA: Diagnosis not present

## 2022-07-02 DIAGNOSIS — H348122 Central retinal vein occlusion, left eye, stable: Secondary | ICD-10-CM | POA: Diagnosis not present

## 2022-07-02 DIAGNOSIS — H35041 Retinal micro-aneurysms, unspecified, right eye: Secondary | ICD-10-CM | POA: Diagnosis not present

## 2022-07-02 DIAGNOSIS — H353111 Nonexudative age-related macular degeneration, right eye, early dry stage: Secondary | ICD-10-CM | POA: Diagnosis not present

## 2022-07-02 LAB — HM DIABETES EYE EXAM

## 2022-07-02 NOTE — Progress Notes (Signed)
See procedure note.

## 2022-07-03 NOTE — Procedures (Signed)
[facility]  HOME SLEEP TEST (Watch PAT) REPORT  STUDY DATE: 07/01/2022  DOB: February 24, 1946  MRN: [id number]  ORDERING CLINICIAN: Star Age, MD, PhD - Study was interpreted on behalf of Dr. Brett Fairy    REFERRING CLINICIAN: Debbora Presto, NP  CLINICAL INFORMATION/HISTORY: 77 year old female with an underlying medical history of congestive heart failure, arthritis, atrial fibrillation, coronary artery disease with MI, hypertension, reflux disease, hyperlipidemia, hypothyroidism, peripheral artery disease, TIA, and diabetes, who presents for evaluation of her obstructive sleep apnea.  She has been compliant with her CPAP of 10 cm with EPR of 3 with good apnea control.  She should be eligible for new equipment.  BMI: 23.8 kg/m  FINDINGS:   Sleep Summary:   Total Recording Time (hours, min): 9 hours, 13 min  Total Sleep Time (hours, min):  8 hours, 18 min  Percent REM (%):    23.2%   Respiratory Indices:   Calculated pAHI (per hour):  34.1/hour         REM pAHI:    44.5/hour       NREM pAHI: 30.4/hour  Central pAHI: 0.6/hour  Oxygen Saturation Statistics:    Oxygen Saturation (%) Mean: 91%   Minimum oxygen saturation (%):                 79%   O2 Saturation Range (%): 79 - 99%    O2 Saturation (minutes) <=88%: 55.5 min  Pulse Rate Statistics:   Pulse Mean (bpm):    80/min    Pulse Range (60- 114/min)   IMPRESSION: OSA (obstructive sleep apnea), severe Nocturnal Hypoxemia  RECOMMENDATION:  This home sleep test demonstrates severe obstructive sleep apnea with a total AHI of 34.1/hour and O2 nadir of 79% with significant time below or at 88% saturation of over 50 minutes for the night, indicating nocturnal hypoxemia.  Snoring was detected, and appeared to be mostly intermittent in the moderate to loud range.  Ongoing treatment with positive airway pressure is highly recommended. The patient has been compliant with her CPAP machine at a pressure of  10 cm with EPR of 2.  She has had good apnea control on the settings and I would recommend a new machine, maintaining the same settings, mask of choice. A laboratory attended titration study can be considered in the future for optimization of treatment settings and to improve tolerance and compliance, if needed down the road.  Given her severe desaturations and nocturnal hypoxemia, I would recommend checking a pulse oximetry test overnight while on CPAP therapy to make sure her oxygen saturations are adequate while she is on PAP therapy at home. Alternative treatment options are limited secondary to the severity of the patient's sleep disordered breathing, but may include surgical treatment with an implantable hypoglossal nerve stimulator (in carefully selected candidates, meeting criteria).  Concomitant weight loss is recommended, where clinically appropriate. Please note, that untreated obstructive sleep apnea may carry additional perioperative morbidity. Patients with significant obstructive sleep apnea should receive perioperative PAP therapy and the surgeons and particularly the anesthesiologist should be informed of the diagnosis and the severity of the sleep disordered breathing. The patient should be cautioned not to drive, work at heights, or operate dangerous or heavy equipment when tired or sleepy. Review and reiteration of good sleep hygiene measures should be pursued with any patient. Other causes of the patient's symptoms, including circadian rhythm disturbances, an underlying mood disorder, medication effect and/or an underlying medical problem cannot be ruled out based on this test.  Clinical correlation is recommended.  The patient and her referring provider will be notified of the test results. The patient will be seen in follow up in sleep clinic at [facility].  I certify that I have reviewed the raw data recording prior to the issuance of this report in accordance with the standards of the American  Academy of Sleep Medicine (AASM).  INTERPRETING PHYSICIAN:   Star Age, MD, PhD Medical Director, [place] [facility]) [place], ABPN (Neurology and Sleep)   [facility] [address], [place], [place] [address] [phone]

## 2022-07-04 ENCOUNTER — Other Ambulatory Visit: Payer: Self-pay | Admitting: Internal Medicine

## 2022-07-04 DIAGNOSIS — E1159 Type 2 diabetes mellitus with other circulatory complications: Secondary | ICD-10-CM

## 2022-07-06 ENCOUNTER — Ambulatory Visit: Payer: PPO | Attending: Nurse Practitioner | Admitting: Nurse Practitioner

## 2022-07-06 ENCOUNTER — Encounter: Payer: Self-pay | Admitting: Nurse Practitioner

## 2022-07-06 VITALS — BP 124/72 | HR 96 | Ht 63.0 in | Wt 134.0 lb

## 2022-07-06 DIAGNOSIS — Z79899 Other long term (current) drug therapy: Secondary | ICD-10-CM | POA: Diagnosis not present

## 2022-07-06 DIAGNOSIS — I951 Orthostatic hypotension: Secondary | ICD-10-CM

## 2022-07-06 DIAGNOSIS — I495 Sick sinus syndrome: Secondary | ICD-10-CM | POA: Diagnosis not present

## 2022-07-06 DIAGNOSIS — Z950 Presence of cardiac pacemaker: Secondary | ICD-10-CM | POA: Diagnosis not present

## 2022-07-06 DIAGNOSIS — Z7901 Long term (current) use of anticoagulants: Secondary | ICD-10-CM

## 2022-07-06 DIAGNOSIS — G4733 Obstructive sleep apnea (adult) (pediatric): Secondary | ICD-10-CM | POA: Diagnosis not present

## 2022-07-06 DIAGNOSIS — I739 Peripheral vascular disease, unspecified: Secondary | ICD-10-CM

## 2022-07-06 DIAGNOSIS — I480 Paroxysmal atrial fibrillation: Secondary | ICD-10-CM

## 2022-07-06 DIAGNOSIS — I5032 Chronic diastolic (congestive) heart failure: Secondary | ICD-10-CM | POA: Diagnosis not present

## 2022-07-06 DIAGNOSIS — I10 Essential (primary) hypertension: Secondary | ICD-10-CM | POA: Diagnosis not present

## 2022-07-06 DIAGNOSIS — I2510 Atherosclerotic heart disease of native coronary artery without angina pectoris: Secondary | ICD-10-CM

## 2022-07-06 MED ORDER — OLMESARTAN MEDOXOMIL 5 MG PO TABS: 5.0000 mg | ORAL_TABLET | Freq: Every day | ORAL | Status: DC

## 2022-07-06 MED ORDER — CARVEDILOL 25 MG PO TABS: 25.0000 mg | ORAL_TABLET | Freq: Two times a day (BID) | ORAL | 6 refills | Status: DC

## 2022-07-06 NOTE — Progress Notes (Signed)
Cardiology Office Note:    Date:  07/06/2022   ID:  Virginia Huber, DOB [birth year], MRN [id number]  PCP:  Sharilyn Sites, [facility] Cardiologist:  Rozann Lesches, MD Electrophysiologist:  Will Meredith Leeds, MD      Referring MD: Sharilyn Sites, MD   CC: Here for follow-up  History of Present Illness:    Virginia Huber is a 77 y.o. female with a hx of the following:  PAD HTN CAD, s/p angioplasty to OM1 in 2016, DES to Cx in 2016, and DES to RCA in 2017 PAF, on Eliquis HFpEF OSA CKD stage 3b (Follows Nephrology) SSS, s/p Medtronic PPM in 2018 Orthostatic hypotension, hx of fall  Was seen by Dr. Domenic Polite in February 2023 and was reportedly doing very well.  Was seen by Amie Portland, PA-C in May 2023 and patient reported dizziness after walking short distances.  She had previous orthostatic blood pressure readings and Dr. Delanna Ahmadi office but did not have any at the cardiology office.  Her diltiazem was reduced from 360 to 180 mg daily and Lasix at 40 mg daily.  At follow-up in June 2023 she was still reportedly extremely dizzy.  Kidney doctor stopped hydralazine and blood pressure was continuing to run low.  Blood pressure log revealed SBP's in 70 systolic.  Lasix, olmesartan, and potassium were stopped at that office visit. The next week, she saw Amie Portland, PA-C back in the office and reported dizziness was better, but was still having some occasionally.  Diltiazem was stopped completely and continued on low-dose Coreg.  She presented to the ED in July 2023 after a fall, was found to have mildly displaced fracture of distal shaft of the fifth metatarsal.  She awoke at 4 AM with leg cramps and got up to take a Tylenol, became dizzy, laid her head on the counter, turned and fell and broke her foot, stated she did not think she passed out.  BP was averaging 277-412I systolic with diastolic blood pressure readings around 70s. Coreg reduced to 6.25 mg BID  at follow-up with Estella Husk, PA-C. Following Ortho.   Seen by Dr. Domenic Polite in September 2023.  Was reportedly doing much better, did not have any recurrent events since last OV.  Device interrogation in July 2023 revealed well under 1% AF/AT burden.  She denied any palpitations at this office visit.  She saw Dr. Curt Bears later that month and did report dizziness a few times per week, that tended to occur when going from seated to standing position, but also occurred at other times as well.  She had been holding medicines at that time, and somewhat helped her symptoms but did not completely improve her symptoms.  Carvedilol was stopped for the next 2 weeks.  Dr. Curt Bears said if this made a difference, this medication would continue to be held, if it did not make a difference medication will be restarted.  Systolic blood pressures later on were averaging 170s, was referred to hypertension clinic with Pharm.D. for further discussion and evaluation.  She also noted via nurse triage note that her tremors were worsening on amiodarone, but she is willing to stay on it if Multaq was unaffordable.  Was evaluated at hypertension clinic with Cammy Copa on April 17, 2022.  Her readings on her home cuff revealed SBP 150, 163 with manual BP reading in office that day averaging 130s.  Carvedilol was continued as 6.25 mg twice daily, olmesartan was increased from 5 mg  to 10 mg daily.  Patient was encouraged to buy new BP cuff for home and was told to follow-up with Pharm.D. in 5 weeks.  She presented to ED with substernal chest pain on May 09, 2022.  She stated that this pain woke her up out of her sleep in the early morning, also had some shortness of breath with diaphoresis, denied any syncope or dizziness.  She had a mechanical fall about 5 days prior to this admission and she went to urgent care on November 16 due to left-sided rib pain, x-rays of her ribs at that time were negative for any fractures.  She  continued to have pleuritic rib pain on the left side.  Troponin 20 >> 24.  Chest x-ray negative.  EKG revealed sinus tach, with IVCD.  Cardiology consulted, Dr. Stanford Breed recommended transfer to [facility] for evaluation. Received 2 SL NG with relief of her CP.   She underwent left heart cath on May 11, 2022 that revealed mild to moderate diffuse disease of entire LAD without focal stenosis, patent stent of proximal circumflex, with chronic occlusion of small caliber first obtuse marginal branch.  Patent stent of posterolateral artery, small caliber distal posterolateral artery beyond the stented segment had moderately severe disease at bifurcation but these vessels were too small for PCI, normal LVEDP.  Medical management was recommended for CAD, with consideration for adding Ranexa or Imdur in setting of diffuse small vessel disease.  TTE revealed EF 40 to 45%, regional wall motion abnormalities of left ventricle noted with severe concentric left ventricular hypertrophy, trivial MR, aortic valve sclerosis present without aortic valve stenosis.  She had TEE with [facility] on 11/21 and had successful cardioversion of A-fib to a paced rhythm. TEE revealed EF 50-55%, severe LVH. Coreg increased to 12.5 mg BID.   I saw her for hospital follow-up on 05/18/2022. CC weakness and shortness of breath, could tell she was back in A-fib. Noted pain throughout chest that she has when A-fib. Denied any recent syncope, presyncope, dizziness, orthopnea, PND, swelling, significant weight changes, acute bleeding, or claudication. Amiodarone was increased to 200  mg BID x 1 week, then reduced to 200 mg daily. Olmesartan held. Told to follow-up with EP. Saw Dr. Curt Bears on 06/02/2022 and discussed ablation including risks vs benefits. Ablation was arranged. Was told to follow up with EP in 5 months.   Today she presents for follow-up. She states she is doing about the same as last office visit, little to no energy.  Can tell it is coming  from her atrial fibrillation.  Says she is scheduled for ablation on August 14, 2022.  Heart rate averaging high 90s to 115 at home.  Weight is stable.  Denies any chest pain, shortness of breath, palpitations, syncope, presyncope, dizziness, orthopnea, PND, swelling or significant weight changes, acute bleeding, or claudication.   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 h/o PAF/flutter during admission in 2013 for PNA. b. PAF during adm for NSTEMI 07/2015, subsequent paroxysms since then.   B12 deficiency anemia    Branch retinal vein occlusion with macular edema of right eye 10/11/2019   Chronic renal failure, stage 3b ([place]) 09/23/2017   Coronary artery disease  11/30/2014   a. remote MI. b. h/o PTCA with scoring balloon to OM1 11/2014. c. NSTEMI 03/2015 s/p DES to prox-mid Cx. d. NSTEMI 07/2015 s/p scoring balloon/PTCA/DES to dRCA with PAF during that admission   Cutaneous lupus erythematosus    Early stage nonexudative age-related macular degeneration of right eye 02/27/2020   Essential hypertension    GERD (gastroesophageal reflux disease)    History of blood transfusion 1980's   2nd surgical procedures   Hypercholesteremia    Hypothyroidism    Myocardial infarction ([place]) 02/2012   NSTEMI (non-ST elevated myocardial infarction) ([place]) 04/02/2015   OSA (obstructive sleep apnea) 05/13/2016   Ovarian tumor    PAD (peripheral artery disease) ([place])    a. s/p LE angio 2015; followed by Dr. Fletcher Anon - managed medically.   Pericardial effusion    a. 06/2016 after ppm - s/p pericardiocentesis.   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 s/p Medtronic PPM 06/2016, c/b lead perf/pericardial effusion.   TIA (transient ischemic attack)    Type 2 diabetes with nephropathy ([place]) 02/29/2012    Past Surgical History:  Procedure Laterality Date   ABDOMINAL AORTAGRAM N/A 01/03/2014   Procedure: ABDOMINAL [name];  Surgeon: Wellington Hampshire, MD;  Location: [facility] CATH LAB;  Service: Cardiovascular;  Laterality: N/A;   ABDOMINAL HYSTERECTOMY  06/22/1970   "partial"   APPENDECTOMY  02/20/1969   CARDIAC CATHETERIZATION  06/22/2006   Tiny OM-2 with 90% narrowing. Med tx.   CARDIAC CATHETERIZATION N/A 11/30/2014   Procedure: Left Heart Cath and Coronary Angiography;  Surgeon: Troy Sine, MD; LAD 20%, CFX 50%, OM1 95%, right PLB 30%, LV normal    CARDIAC CATHETERIZATION N/A 11/30/2014   Procedure: Coronary Balloon Angioplasty;  Surgeon: Troy Sine, MD;  Angiosculpt scoring balloon and PTCA to the OM1 reducing stenosis from 95% to less than 10%   CARDIAC CATHETERIZATION N/A 04/03/2015   Procedure: Left Heart Cath and Coronary Angiography;  Surgeon: Jolaine Artist, MD; dLAD 50%, CFX 90%, OM1 100%, PLA 15%, LVEDP 13     CARDIAC CATHETERIZATION N/A 04/03/2015   Procedure: Coronary Stent Intervention;  Surgeon: Sherren Mocha, MD; 3.0x18 mm Xience DES to the CFX     CARDIAC CATHETERIZATION N/A 08/02/2015   Procedure: Left Heart Cath and Coronary Angiography;  Surgeon: Troy Sine, MD;  Location: [facility] CV LAB;  Service: Cardiovascular;  Laterality: N/A;   CARDIAC CATHETERIZATION N/A 08/02/2015   Procedure: Coronary Stent Intervention;  Surgeon: Troy Sine, MD;  Location: [place] [facility] CV LAB;  Service: Cardiovascular;   Laterality: N/A;   CARDIAC CATHETERIZATION N/A 06/25/2016   Procedure: Pericardiocentesis;  Surgeon: Will Meredith Leeds, MD;  Location: [facility] CV LAB;  Service: Cardiovascular;  Laterality: N/A;   cardiac stents     CARDIOVERSION N/A 12/15/2017   Procedure: CARDIOVERSION;  Surgeon: Acie Fredrickson Wonda Cheng, MD;  Location: [facility] ENDOSCOPY;  Service: Cardiovascular;  Laterality: N/A;   CARDIOVERSION N/A 05/12/2022   Procedure: CARDIOVERSION;  Surgeon: Sueanne Margarita, MD;  Location: [facility];  Service: Cardiovascular;  Laterality: N/A;   CHOLECYSTECTOMY OPEN  02/20/1989   COLONOSCOPY  06/23/2003   Dr.  Rehman: pancolonic divericula, polyp, path unknown currently   COLONOSCOPY  06/22/2010   Dr. Oneida Alar: Normal TI, scattered diverticula in entire colon, small internal hemorrhoids, normal colon biopsies. Colonoscopy in 5-10 years.    COLONOSCOPY WITH PROPOFOL N/A 06/02/2021   pancolonic diverticulosis. Two 4-6 mm polyps in transverse colon. Sessile serrated and hyperplastic. 5 year surveillance if benefits outweigh the risks.   COLOSTOMY  05/23/1979   COLOSTOMY REVERSAL  11/21/1979   EP IMPLANTABLE DEVICE N/A 06/25/2016   Procedure: Lead Revision/Repair;  Surgeon: Will Meredith Leeds, MD;  Location: [facility] CV LAB;  Service: Cardiovascular;  Laterality: N/A;   EP IMPLANTABLE DEVICE N/A 06/25/2016   Procedure: Pacemaker Implant;  Surgeon: Will Meredith Leeds, MD;  Location: [facility] CV LAB;  Service: Cardiovascular;  Laterality: N/A;   EXCISIONAL HEMORRHOIDECTOMY  02/20/1969   EYE SURGERY Left 06/22/1998   "branch vein occlusion"   EYE SURGERY Left ~ 2001   "smoothed out wrinkle"   LEFT HEART CATH AND CORONARY ANGIOGRAPHY N/A 05/11/2022   Procedure: LEFT HEART CATH AND CORONARY ANGIOGRAPHY;  Surgeon: Burnell Blanks, MD;  Location: [place] [facility] CV LAB;  Service: Cardiovascular;  Laterality: N/A;   LEFT OOPHORECTOMY  05/23/1979   nicked bowel, peritonitis, colostomy; colostomy  reversed 1981    LOWER EXTREMITY ANGIOGRAM N/A 01/03/2014   Procedure: LOWER EXTREMITY ANGIOGRAM;  Surgeon: Wellington Hampshire, MD;  Location: [facility] CATH LAB;  Service: Cardiovascular;  Laterality: N/A;   Nuclear med stress test  10/21/2011   Small area of mild ischemia inferoapically.   PARTIAL HYSTERECTOMY  02/20/1969   left ovaries, then ovaries removed later due tumors    POLYPECTOMY  06/02/2021   Procedure: POLYPECTOMY;  Surgeon: Eloise Harman, DO;  [facility];  Service: Endoscopy;;   right eye surgery  03/2021   RIGHT OOPHORECTOMY  02/20/1969   TEE WITHOUT CARDIOVERSION N/A 05/12/2022   Procedure: TRANSESOPHAGEAL ECHOCARDIOGRAM (TEE);  Surgeon: Sueanne Margarita, MD;  Location[facility] ENDOSCOPY;  Service: Cardiovascular;  Laterality: N/A;    Current Medications: Current Meds  Medication Sig   acetaminophen (TYLENOL) 325 MG tablet Take 650 mg by mouth every 6 (six) hours as needed for headache.   apixaban (ELIQUIS) 5 MG TABS tablet TAKE ONE TABLET BY MOUTH TWICE DAILY   atorvastatin (LIPITOR) 80 MG tablet TAKE ONE TABLET BY MOUTH EVERY DAY AT 6:00PM (Patient taking differently: Take 80 mg by mouth daily.)   calcitRIOL (ROCALTROL) 0.25 MCG capsule Take 0.25 mcg by mouth 3 (three) times a week.   carvedilol (COREG) 6.25 MG tablet Take 2 tablets (12.5 mg total) by mouth 2 (two) times daily.   Cholecalciferol (VITAMIN D3 PO) Take 2,000 Units by mouth every evening.   clopidogrel (PLAVIX) 75 MG tablet TAKE ONE TABLET ('75MG'$  TOTAL) BY MOUTH DAILY   Continuous Blood Gluc Receiver (FREESTYLE LIBRE 2 READER) DEVI 1 each by Does not apply route daily.   Continuous Blood Gluc Sensor (FREESTYLE LIBRE 2 SENSOR) MISC USE ONE FOR FOURTEEN DAYS   glucose blood (FREESTYLE PRECISION NEO TEST) test strip Use as instructed   Insulin NPH, Human,, Isophane, (NOVOLIN N FLEXPEN) 100 UNIT/ML Kiwkpen INJECT 25 UNITS in THE MORNING AND 15 UNITS BEFORE dinner   levothyroxine (SYNTHROID) 112 MCG tablet  TAKE ONE TABLET BY MOUTH ONCE DAILY   rOPINIRole (REQUIP) 0.5 MG tablet Take 0.5 mg by mouth at bedtime.    Semaglutide, 2 MG/DOSE, 8 MG/3ML SOPN Inject 2 mg as directed once a week. (Patient taking differently:  Inject 2 mg as directed once a week. Wednesday)   amiodarone (PACERONE) 200 MG tablet Take 1 tablet (200 mg total) by mouth daily.   nitroGLYCERIN (NITROSTAT) 0.4 MG SL tablet Place 1 tablet (0.4 mg total) under the tongue every 5 (five) minutes as needed for chest pain.    olmesartan (BENICAR) 5 MG tablet Take 5 mg by mouth daily.     Allergies:   Penicillins and Percocet [oxycodone-acetaminophen]   Social History   Socioeconomic History   Marital status: Married    Spouse name: Not on file   Number of children: Not on file   Years of education: Not on file   Highest education level: Not on file  Occupational History   Occupation: Retired    Fish farm manager: RETIRED    Comment: insurance billing  Tobacco Use   Smoking status: Never    Passive exposure: Never   Smokeless tobacco: Never   Tobacco comments:    Never smoked  Vaping Use   Vaping Use: Never used  Substance and Sexual Activity   Alcohol use: No    Alcohol/week: 0.0 standard drinks of alcohol   Drug use: No   Sexual activity: Never    Birth control/protection: Surgical    Comment: hyst  Other Topics Concern   Not on file  Social History Narrative   Not on file   Social Determinants of Health   Financial Resource Strain: Not on file  Food Insecurity: No Food Insecurity (05/10/2022)   Hunger Vital Sign    Worried About Running Out of Food in the Last Year: Never true    Ran Out of Food in the Last Year: Never true  Transportation Needs: No Transportation Needs (05/10/2022)   PRAPARE - Hydrologist (Medical): No    Lack of Transportation (Non-Medical): No  Physical Activity: Not on file  Stress: Not on file  Social Connections: Not on file     Family History: The patient's  family history includes Diabetes in her brother; Heart disease in her brother, brother, father, mother, and sister; Lupus in her daughter; Thyroid disease in her brother and brother. There is no history of Colon cancer.  ROS:   Review of Systems  Constitutional:  Positive for malaise/fatigue. Negative for chills, diaphoresis, fever and weight loss.  HENT: Negative.    Eyes: Negative.   Respiratory: Negative.    Cardiovascular:  Positive for palpitations. Negative for chest pain, orthopnea, claudication, leg swelling and PND.  Gastrointestinal: Negative.   Genitourinary: Negative.   Musculoskeletal: Negative.   Skin: Negative.   Neurological: Negative.   Endo/Heme/Allergies: Negative.   Psychiatric/Behavioral: Negative.       Please see the history of present illness.    All other systems reviewed and are negative.  EKGs/Labs/Other Studies Reviewed:    The following studies were reviewed today:   EKG:  EKG is not ordered today.    TEE on 05/12/2022: 1. Left ventricular ejection fraction, by estimation, is 50 to 55%. The  left ventricle has low normal function. The left ventricle has no regional  wall motion abnormalities. There is severe concentric left ventricular  hypertrophy.   2. Right ventricular systolic function is normal. The right ventricular  size is normal.   3. Left atrial size was mildly dilated. No left atrial/left atrial  appendage thrombus was detected. The LAA emptying velocity was 16 cm/s.   4. The mitral valve is normal in structure. Mild mitral valve  regurgitation. No  evidence of mitral stenosis.   5. The aortic valve is tricuspid. Aortic valve regurgitation is not  visualized. Aortic valve sclerosis/calcification is present, without any  evidence of aortic stenosis.   6. The inferior vena cava is normal in size with greater than 50%  respiratory variability, suggesting right atrial pressure of 3 mmHg.   Conclusion(s)/Recommendation(s): Normal  biventricular function without  evidence of hemodynamically significant valvular heart disease.   Left heart cath on 05/11/22:   Prox Cx to Mid Cx lesion is 10% stenosed.   1st Mrg lesion is 100% stenosed.   Ost LAD to Mid LAD lesion is 30% stenosed.   Mid LAD to Dist LAD lesion is 30% stenosed.   Ost RCA to Prox RCA lesion is 40% stenosed.   RPAV-1 lesion is 20% stenosed.   RPAV-2 lesion is 70% stenosed.   2nd RPL lesion is 70% stenosed.   The LAD is a moderate caliber vessel that courses to the apex. There appears to be diffuse mild to moderate disease in the entire LAD without a focal stenosis.  2. The Circumflex has a patent proximal stented segment. This vessel terminates into an obtuse marginal branch. The small caliber first obtuse marginal branch is chronically occluded.  3. The RCA is a large caliber dominant vessel with a patent stent in the posterolateral artery. The small caliber distal posterolateral artery beyond the stented segment has moderately severe disease at a bifurcation but the vessels appear too small for PCI.  4. Normal LVEDP   Recommendations: Medical management of CAD. Consider adding Imdur or Ranexa for angina in setting of diffuse small vessel disease.    2D Echo complete on 05/10/2022: 1. Severe hypokinesis of the anteroseptal wall with overall mild LV  dysfunction.   2. Left ventricular ejection fraction, by estimation, is 40 to 45%. The  left ventricle has mildly decreased function. The left ventricle  demonstrates regional wall motion abnormalities (see scoring  diagram/findings for description). There is severe  concentric left ventricular hypertrophy. Left ventricular diastolic  parameters are indeterminate.   3. Right ventricular systolic function is normal. The right ventricular  size is normal.   4. The mitral valve is normal in structure. Trivial mitral valve  regurgitation. No evidence of mitral stenosis.   5. The aortic valve is tricuspid.  Aortic valve regurgitation is not  visualized. Aortic valve sclerosis is present, with no evidence of aortic  valve stenosis.   6. The inferior vena cava is normal in size with greater than 50%  respiratory variability, suggesting right atrial pressure of 3 mmHg.  Lexiscan on 01/20/2018: Downsloping ST segment depression ST segment depression of 0.5 mm was noted during stress in the II, III, aVF, V5 and V6 leads. Defect 1: There is a small defect of mild severity present in the apical anterior and apical septal location. This likely represents soft tissue attenuation. A small degree of scar cannot entirely be excluded. No signficant ischemia. This is a low risk study. Nuclear stress EF: 64%.  Left heart cath on 08/02/2015: 1st RPLB lesion, 50% stenosed. Dist RCA lesion, 30% stenosed. Ost 1st Mrg to 1st Mrg lesion, 100% stenosed. Ost LAD lesion, 40% stenosed. Mid LAD lesion, 40% stenosed. Post Atrio lesion, 90% stenosed. Post intervention, there is a 0% residual stenosis. The left ventricular systolic function is normal. Mid RCA lesion, 20% stenosed.   Normal LV function without focal segmental wall motion abnormalities and ejection fraction of 55%.   Significant coronary obstructive disease with diffuse  mild luminal narrowing of the LAD with 40% proximal and mid stenoses; widely patent stent in the proximal circumflex with old occlusion of the marginal branch which had arisen in the region of the stented segment but with evidence for very faint collateralization to this distal marginal vessel from the the LAD; a large dominant RCA with 20%, mild mid narrowing, 30% narrowing after the acute margin and focal 90% stenosis distally prior to a PDA 2 vessel with 50% narrowing at the ostium of this PDA prior to a bend in the vessel with the RCA ending in the PLA vessel.   Successful percutaneous cardiac intervention to the distal RCA treated with Angiosculpt scoring balloon, PTCA, and ultimate  stenting with a 2.58 mm Xience Alpine DES stent postdilated to 2.51 mm with a 90% stenosis reduced to 0% and no change in the ostial PDA narrowing.   RECOMMENDATION: Since the patient suffered a non-ST segment elevation myocardial infarction on Plavix, Brilinta was administered for oral antiplatelet therapy.  It was felt most likely the patient's atrial fibrillation was ischemic mediated.  If anticoagulation is necessary, Brilinta will need to be discontinued and changed back to Plavix due to potential bleed risk if  triple therapy is needed short-term.  Recent Labs: 03/17/2022: ALT 27; TSH 0.571 05/10/2022: Magnesium 1.8 05/27/2022: BUN 14; Creatinine, Ser 1.45; Hemoglobin 12.2; Platelets 320; Potassium 4.0; Sodium 144  Recent Lipid Panel    Component Value Date/Time   CHOL 96 05/10/2022 0158   TRIG 156 (H) 05/10/2022 0158   HDL 30 (L) 05/10/2022 0158   CHOLHDL 3.2 05/10/2022 0158   VLDL 31 05/10/2022 0158   LDLCALC 35 05/10/2022 0158   LDLCALC 96 08/05/2017 0807     Risk Assessment/Calculations:    CHA2DS2-VASc Score = 6  This indicates a 9.7% annual risk of stroke. The patient's score is based upon: CHF History: 1 HTN History: 1 Diabetes History: 0 Stroke History: 0 Vascular Disease History: 1 Age Score: 2 Gender Score: 1   Physical Exam:    VS:  BP 124/72   Pulse 96   Ht '5\' 3"'$  (1.6 m)   Wt 134 lb (60.8 kg)   SpO2 98%   BMI 23.74 kg/m     Wt Readings from Last 3 Encounters:  07/06/22 134 lb (60.8 kg)  06/02/22 136 lb 6.4 oz (61.9 kg)  05/18/22 137 lb 9.6 oz (62.4 kg)     GEN: Well nourished, well developed in no acute distress HEENT: Normal NECK: No JVD; No carotid bruits CARDIAC: S1.S2, irregular rate and fast rhythm, no murmurs, rubs, gallops; 2+ peripheral pulses  RESPIRATORY:  Clear and diminished to auscultation without rales, wheezing or rhonchi  MUSCULOSKELETAL:  No edema; No deformity  SKIN: Thin skin, warm and dry NEUROLOGIC:  Alert and oriented x  3, mild postural tremors noted along arms bilaterally PSYCHIATRIC:  Normal affect   ASSESSMENT:    1. PAF (paroxysmal atrial fibrillation) ([place])   2. Current use of long term anticoagulation   3. Medication management   4. Coronary artery disease involving native heart without angina pectoris, unspecified vessel or lesion type   5. PAD (peripheral artery disease) ([place])   6. Chronic heart failure with preserved ejection fraction (HFpEF) ([place])   7. Hypertension, unspecified type   8. Sick sinus syndrome ([place])   9. S/P placement of cardiac pacemaker   10. Orthostatic hypotension   11. OSA on CPAP     PLAN:    In order of problems listed  above:  PAF, long term anticoagulation, medication management Does admit to continued of A-fib. Will increase Carvedilol to 25 mg BID. Continue amiodarone. CHA2DS2-VASc score is 6.  She has been compliant with Eliquis and has not skipped any doses, denies any bleeding issues.  Currently she is on appropriate dosage of this medication.  Discussed if weight declines, plan to reduce Eliquis to 2.5 mg twice daily.  She verbalized understanding.  Will obtain BMET to reevaluate kidney function, if weight continues to decline and kidney function continues to increase, plan to decrease Eliquis to 2.5 mg twice daily.  Continue Eliquis 5 mg twice daily.  Scheduled for ablation next month.  Discussed avoiding triggers to A-fib.  Continue rest of current medication regimen.  CAD, s/p angioplasty to OM1 in 2016, DES to circumflex in 2016, and DES to RCA in 2017 Does admit to associated symptom of atypical chest pain with her palpitations.  Recent left heart cath revealed mild to moderate dx in entire LAD without a focal stenosis. Small caliber of first OM branch was found to be chronically occluded.  The small caliber distal posterolateral artery beyond the stented segment was found to have moderately severe disease at the bifurcation, but the vessels appear to be too small  for PCI.  Medical management was recommended for CAD.  Consideration for adding Ranexa or Imdur for angina in the setting of diffuse small vessel disease. Stable with no anginal symptoms. No indication for ischemic evaluation. Continue Lipitor, carvedilol, Plavix, and hold Olmesartan. ED precautions discussed. Heart healthy diet and regular cardiovascular exercise encouraged.   3. PAD ABI's in 2015 revealed moderate depression of resting left ABI to 0.66, segmental analysis revealed pattern of tibial and potentially distal SFA/popliteal disease with digital disease also suspected; there is no evidence of significant arterial occlusive disease in right lower extremity.  She denies any recent claudication symptoms.  Continue current medication regimen.  4. Chronic HFpEF 2D complete echo on 11/19 revealed EF 40 to 45% with severe hypokinesis of the anteroseptal wall with overall mild LV dysfunction noted, severe concentric LVH noted as well.  However, TEE with cardioversion revealed EF 50 to 55%, no RWMA noted, severe concentric LVH noted. Euvolemic and well compensated on exam. Low sodium diet, fluid restriction <2L, and daily weights encouraged. Educated to contact our office for weight gain of 2 lbs overnight or 5 lbs in one week.  Increase Carvedilol and hold olmesartan. Heart healthy diet and regular cardiovascular exercise encouraged. Consider SGLT2i/spironolactone in future if EF continues to decline for GDMT.   5. Hypertension Blood pressure today 124/72. SBP averaging 140's to 150's at home. Increasing Coreg and holding olmesartan. Discussed to monitor BP at home at least 2 hours after medications and sitting for 5-10 minutes.  BP log given today.  Continue rest of medication regimen.  Heart healthy diet and regular cardiovascular exercise encouraged.   6. SSS, s/p Pacemaker Remote device check in October 2023 revealed 6 AT/AF episodes, the longest lasted 44 minutes, burden 0.3%, normal device  function, battery and lead parameters are stable.  Continue to follow up with EP.   7. Orthostatic hypotension Denies any recent symptoms.  Hold olmesartan.  Continue rest of current medication regimen.    8. OSA on CPAP Does report wearing CPAP. Continue to follow with PCP.  9.  Disposition: Follow-up with me or Dr. Domenic Polite in 6 to 8 weeks or sooner if anything changes.     Medication Adjustments/Labs and Tests Ordered: Current medicines are  reviewed at length with the patient today.  Concerns regarding medicines are outlined above.  No orders of the defined types were placed in this encounter.  Meds ordered this encounter  Medications   carvedilol (COREG) 25 MG tablet    Sig: Take 1 tablet (25 mg total) by mouth 2 (two) times daily.    Dispense:  60 tablet    Refill:  6    Dose increased 07/06/2022   olmesartan (BENICAR) 5 MG tablet    Sig: Take 1 tablet (5 mg total) by mouth daily. (HOLDING CURRENTLY 07/06/2022)    05/18/22 Dose Decrease    Patient Instructions  Medication Instructions:  Increase Coreg to '25mg'$  twice a day   Hold Benicar (Olmesartan) Continue all other medications.     Labwork: none  Testing/Procedures: none  Follow-Up: 6 - 8 weeks    Any Other Special Instructions Will Be Listed Below (If Applicable). Your physician has requested that you regularly monitor and record your blood pressure readings at home. Please use the same machine at the same time of day to check your readings and record them to bring to your follow-up visit.  If you need a refill on your cardiac medications before your next appointment, please call your pharmacy.    Signed, Finis Bud, NP  07/06/2022 1:56 PM    [place]

## 2022-07-06 NOTE — Patient Instructions (Addendum)
Medication Instructions:  Increase Coreg to '25mg'$  twice a day   Hold Benicar (Olmesartan) Continue all other medications.     Labwork: none  Testing/Procedures: none  Follow-Up: 6 - 8 weeks    Any Other Special Instructions Will Be Listed Below (If Applicable). Your physician has requested that you regularly monitor and record your blood pressure readings at home. Please use the same machine at the same time of day to check your readings and record them to bring to your follow-up visit.  If you need a refill on your cardiac medications before your next appointment, please call your pharmacy.

## 2022-07-07 ENCOUNTER — Other Ambulatory Visit: Payer: Self-pay | Admitting: Family Medicine

## 2022-07-07 DIAGNOSIS — G4733 Obstructive sleep apnea (adult) (pediatric): Secondary | ICD-10-CM

## 2022-07-08 ENCOUNTER — Telehealth: Payer: Self-pay | Admitting: Family Medicine

## 2022-07-08 ENCOUNTER — Ambulatory Visit: Payer: PPO | Attending: Cardiology

## 2022-07-08 DIAGNOSIS — I495 Sick sinus syndrome: Secondary | ICD-10-CM

## 2022-07-08 NOTE — Telephone Encounter (Signed)
[place] Mariann Laster) LVM at 3:27 pm; Calling regarding prescription for new CPAP machine. Need to have face to face notes, if she had those done to correspond with this prescription. If you would have those faxed to [phone], attention to [facility]. If you have any questions please give me a call to my direct line, [phone].

## 2022-07-08 NOTE — Telephone Encounter (Signed)
Order/ notes faxed to [place] to [employer].

## 2022-07-09 ENCOUNTER — Ambulatory Visit: Payer: HMO | Admitting: Nurse Practitioner

## 2022-07-09 LAB — CUP PACEART REMOTE DEVICE CHECK
Battery Remaining Longevity: 37 mo
Battery Voltage: 2.97 V
Brady Statistic AP VP Percent: 0.06 %
Brady Statistic AP VS Percent: 0.56 %
Brady Statistic AS VP Percent: 7.26 %
Brady Statistic AS VS Percent: 92.13 %
Brady Statistic RA Percent Paced: 0.4 %
Brady Statistic RV Percent Paced: 5.02 %
Date Time Interrogation Session: 20240117105618
Implantable Lead Connection Status: 753985
Implantable Lead Connection Status: 753985
Implantable Lead Implant Date: 20180104
Implantable Lead Implant Date: 20180104
Implantable Lead Location: 753859
Implantable Lead Location: 753860
Implantable Lead Model: 5076
Implantable Lead Model: 5076
Implantable Pulse Generator Implant Date: 20180104
Lead Channel Impedance Value: 323 Ohm
Lead Channel Impedance Value: 342 Ohm
Lead Channel Impedance Value: 380 Ohm
Lead Channel Impedance Value: 475 Ohm
Lead Channel Pacing Threshold Amplitude: 0.75 V
Lead Channel Pacing Threshold Amplitude: 1.375 V
Lead Channel Pacing Threshold Pulse Width: 0.4 ms
Lead Channel Pacing Threshold Pulse Width: 0.4 ms
Lead Channel Sensing Intrinsic Amplitude: 1.75 mV
Lead Channel Sensing Intrinsic Amplitude: 1.75 mV
Lead Channel Sensing Intrinsic Amplitude: 15.625 mV
Lead Channel Sensing Intrinsic Amplitude: 15.625 mV
Lead Channel Setting Pacing Amplitude: 2 V
Lead Channel Setting Pacing Amplitude: 3 V
Lead Channel Setting Pacing Pulse Width: 0.4 ms
Lead Channel Setting Sensing Sensitivity: 2.8 mV
Zone Setting Status: 755011
Zone Setting Status: 755011

## 2022-07-10 ENCOUNTER — Other Ambulatory Visit: Payer: Self-pay | Admitting: Internal Medicine

## 2022-07-10 ENCOUNTER — Telehealth: Payer: Self-pay | Admitting: Family Medicine

## 2022-07-10 ENCOUNTER — Other Ambulatory Visit: Payer: Self-pay | Admitting: *Deleted

## 2022-07-10 MED ORDER — AMIODARONE HCL 200 MG PO TABS: 200.0000 mg | ORAL_TABLET | Freq: Every day | ORAL | 6 refills | Status: DC

## 2022-07-10 NOTE — Telephone Encounter (Signed)
Mariann Laster is calling. Stated she needs face to face notes on pt supporting why pt needs new Cpap machine.

## 2022-07-10 NOTE — Telephone Encounter (Signed)
Spoke to [facility] and she stated that they received the order but the notes were not received. Please advise.

## 2022-07-10 NOTE — Telephone Encounter (Signed)
Can you call her back? [place] faxed order/notes yesterday around 4:50pm. Did she not receive?

## 2022-07-10 NOTE — Telephone Encounter (Signed)
Faxed last office note to them at [phone]. Received fax confirmation.

## 2022-07-13 ENCOUNTER — Encounter: Payer: Self-pay | Admitting: Cardiology

## 2022-07-14 ENCOUNTER — Other Ambulatory Visit: Payer: Self-pay | Admitting: Internal Medicine

## 2022-07-14 DIAGNOSIS — E1165 Type 2 diabetes mellitus with hyperglycemia: Secondary | ICD-10-CM

## 2022-07-14 NOTE — Telephone Encounter (Signed)
Pt is scheduled for 2/23 @ 7:30am. She is aware of this date/time.   She will have labwork done on 2/1 and we will determine at that time if she will need CT or TEE.

## 2022-07-15 ENCOUNTER — Other Ambulatory Visit: Payer: Self-pay

## 2022-07-15 ENCOUNTER — Ambulatory Visit (INDEPENDENT_AMBULATORY_CARE_PROVIDER_SITE_OTHER): Payer: PPO | Admitting: Internal Medicine

## 2022-07-15 ENCOUNTER — Encounter: Payer: Self-pay | Admitting: Internal Medicine

## 2022-07-15 VITALS — BP 130/76 | HR 84 | Ht 63.0 in | Wt 136.4 lb

## 2022-07-15 DIAGNOSIS — E785 Hyperlipidemia, unspecified: Secondary | ICD-10-CM

## 2022-07-15 DIAGNOSIS — E039 Hypothyroidism, unspecified: Secondary | ICD-10-CM

## 2022-07-15 DIAGNOSIS — E1121 Type 2 diabetes mellitus with diabetic nephropathy: Secondary | ICD-10-CM

## 2022-07-15 DIAGNOSIS — I480 Paroxysmal atrial fibrillation: Secondary | ICD-10-CM

## 2022-07-15 LAB — POCT GLYCOSYLATED HEMOGLOBIN (HGB A1C): Hemoglobin A1C: 5.8 % — AB (ref 4.0–5.6)

## 2022-07-15 NOTE — Patient Instructions (Addendum)
Please change:: - NPH/R 70/30 20 units in am and 8-10 units before dinner  Continue: - Ozempic 2 mg weekly  Continue Levothyroxine 112 mcg daily.  Take the thyroid hormone every day, with water, at least 30 minutes before breakfast, separated by at least 4 hours from: - acid reflux medications - calcium - iron - multivitamins  Please return in 4-6 months.

## 2022-07-15 NOTE — Progress Notes (Signed)
Patient ID: GIANNE SHUGARS, female   DOB: 07-Jan-1946, 77 y.o.   MRN: [id number]   HPI: CINDRA AUSTAD is a 77 y.o.-year-old female, initially referred by her PCP, Dr.Golding, returning for follow-up for DM2, dx in ~2005, insulin-dependent since 2006, uncontrolled, with complications (CAD, s/p NSTEMI, s/p PTCA and DES; CHF; PAF/flutter; PAD; CKD; cerebrovascular disease - s/p TIA; PN).  She previously saw my colleague, Dr. Loanne Drilling.  Our last visit was 5 months ago.  Interim history: She continues to have blurry vision and getting intraocular injections. No increased urination, nausea, chest pain. She has SOB - sleeps on 2 pillows. She had dizziness - resolved. She has Afib  was hospitalized in 04/2022 for cardioversion. She will have an ablation 07/2022.  DM2: Reviewed HbA1c levels: Lab Results  Component Value Date   HGBA1C 6.4 (H) 05/10/2022   HGBA1C 6.0 (A) 02/09/2022   HGBA1C 6.7 (A) 10/09/2021   HGBA1C 7.5 (A) 06/06/2021   HGBA1C 7.7 (A) 02/04/2021   HGBA1C 7.4 (A) 10/22/2020   HGBA1C 6.7 (A) 07/23/2020   HGBA1C 7.3 (A) 04/15/2020   HGBA1C 8.7 (A) 02/14/2020   HGBA1C 7.7 12/04/2019   Pt is on a regimen of: - NPH/R 70/30 20-22 >> 28 units in am and 28-30 >> 24-26 >> 25 units before dinner >> 25-28 units in am and 25 units before dinner >>  25 units in am and 15 units before dinner - Trulicity 3 mg weekly >> Ozempic 2 mg weekly -started 05/2021  No FH of MTC or personal h/o pancreatitis. She was on Metformin >> stopped b/c CKD.  Pt checks her sugars 4x a day with the Libre 2 CGM:  Previously:   Prev.:   Lowest sugar was 70 >> 55 >> 60s; she has hypoglycemia awareness in the 70s.  Highest sugar was 200s >> 190s >> 180s.  Glucometer: Freestyle lite  Patient starting to improve her meals after her last HbA1c returned: - Brunch: scrambled egg + bacon + rye bread or cheerios + skim milk - Dinner: meat - grilled + veggies - Snacks: seldom - apple Drinks occasional diet  drinks  -+ CKD- Dr. Theador Hawthorne, last BUN/creatinine:  Lab Results  Component Value Date   BUN 14 05/27/2022   BUN 18 05/12/2022   CREATININE 1.45 (H) 05/27/2022   CREATININE 1.30 (H) 05/12/2022  06/24/2020: BUN/creatinine 24/1.7 On Benicar 40.  -+ HL; last set of lipids: Lab Results  Component Value Date   CHOL 96 05/10/2022   HDL 30 (L) 05/10/2022   LDLCALC 35 05/10/2022   TRIG 156 (H) 05/10/2022   CHOLHDL 3.2 05/10/2022  07/18/2021: 126/160/41/58 07/17/2020: 121/146/38/58 On Lipitor 80.  - last eye exam was on 09/22/2021: Severe NPDR OD + macular edema. Dr Zadie Rhine.  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  She has a history of BRAO.  She had eye surgery 03/2021 >> vision is better.  - she has numbness and tingling in her feet.  Latest foot exam was performed by podiatry 03/26/2022-Dr. Standiford. She has onychomycosis.  Pt has FH of DM in M and her family.  Hypothyroidism:  Pt is on levothyroxine  112 mcg daily (dose decreased to 08/2021), taken: - in am - fasting - at least 30 min from b'fast - no calcium - no iron - no multivitamins - no PPIs - not on Biotin  TSH levels were reviewed: Lab Results  Component Value Date   TSH 0.571 03/17/2022  TSH 1.18 10/09/2021   TSH 0.183 (L) 08/25/2021   TSH 0.40 02/04/2021   TSH 0.22 (L) 10/22/2020   TSH 0.233 (L) 08/22/2020   TSH 2.410 02/22/2020   TSH 0.424 (L) 08/15/2019   TSH 0.059 (L) 12/17/2017   TSH 0.05 (L) 12/06/2017   She has an extensive cardiac history.  Besides mentioned in HPI, she also has a history of heart block, tachybradycardia syndrome, pericardial effusion (history of tamponade).  She is on amiodarone, Eliquis, Plavix, Lasix.    ROS: + see HPI  I reviewed pt's medications, allergies, PMH, social hx, family hx, and changes were documented in the history of present illness. Otherwise, unchanged from my initial visit note.  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 h/o PAF/flutter during admission in 2013 for PNA. b. PAF during adm for NSTEMI 07/2015, subsequent paroxysms since then.   B12 deficiency anemia    Branch retinal vein occlusion with macular edema of right eye 10/11/2019   Chronic renal failure, stage 3b ([place]) 09/23/2017   Coronary artery disease 11/30/2014   a. remote MI. b. h/o PTCA with scoring balloon to OM1 11/2014. c. NSTEMI 03/2015 s/p DES to prox-mid Cx. d. NSTEMI 07/2015 s/p scoring balloon/PTCA/DES to dRCA with PAF during that admission   Cutaneous lupus erythematosus    Early stage nonexudative age-related macular degeneration of right eye 02/27/2020   Essential hypertension    GERD (gastroesophageal reflux disease)    History of blood transfusion 1980's   2nd surgical procedures   Hypercholesteremia    Hypothyroidism    Myocardial infarction ([place]) 02/2012   NSTEMI (non-ST elevated myocardial infarction) ([place]) 04/02/2015   OSA (obstructive sleep apnea) 05/13/2016   Ovarian tumor    PAD (peripheral artery disease) ([place])    a. s/p LE angio 2015; followed by Dr. Fletcher Anon - managed medically.   Pericardial effusion    a. 06/2016 after ppm - s/p pericardiocentesis.   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 s/p Medtronic PPM 06/2016, c/b lead perf/pericardial effusion.    TIA (transient ischemic attack)    Type 2 diabetes with nephropathy ([place]) 02/29/2012   Past Surgical History:  Procedure Laterality Date   ABDOMINAL AORTAGRAM N/A 01/03/2014   Procedure: ABDOMINAL [name];  Surgeon: Wellington Hampshire, MD;  Location: [place] [facility] CATH LAB;  Service: Cardiovascular;  Laterality: N/A;   ABDOMINAL HYSTERECTOMY  06/22/1970   "partial"   APPENDECTOMY  02/20/1969   CARDIAC CATHETERIZATION  06/22/2006   Tiny OM-2 with 90% narrowing. Med tx.   CARDIAC CATHETERIZATION N/A 11/30/2014   Procedure: Left Heart Cath and Coronary Angiography;  Surgeon: Troy Sine, MD; LAD 20%, CFX 50%, OM1 95%, right PLB 30%, LV normal    CARDIAC CATHETERIZATION N/A 11/30/2014   Procedure: Coronary Balloon Angioplasty;  Surgeon: Troy Sine, MD;  Angiosculpt scoring balloon and PTCA  to the OM1 reducing stenosis from 95% to less than 10%   CARDIAC CATHETERIZATION N/A 04/03/2015   Procedure: Left Heart Cath and Coronary Angiography;  Surgeon: Jolaine Artist, MD; dLAD 50%, CFX 90%, OM1 100%, PLA 15%, LVEDP 13     CARDIAC CATHETERIZATION N/A 04/03/2015   Procedure: Coronary Stent Intervention;  Surgeon: Sherren Mocha, MD; 3.0x18 mm Xience DES to the CFX     CARDIAC CATHETERIZATION N/A 08/02/2015   Procedure: Left Heart Cath and Coronary Angiography;  Surgeon: Troy Sine, MD;  Location: [facility] CV LAB;  Service: Cardiovascular;  Laterality: N/A;   CARDIAC CATHETERIZATION N/A 08/02/2015   Procedure: Coronary Stent Intervention;  Surgeon: Troy Sine, MD;  Location: [facility] CV LAB;  Service: Cardiovascular;  Laterality: N/A;   CARDIAC CATHETERIZATION N/A 06/25/2016   Procedure: Pericardiocentesis;  Surgeon: Will Meredith Leeds, MD;  Location: [place] [facility] CV LAB;  Service: Cardiovascular;  Laterality: N/A;   cardiac stents     CARDIOVERSION N/A 12/15/2017   Procedure: CARDIOVERSION;  Surgeon: Acie Fredrickson, Wonda Cheng, MD;  Location: [facility] ENDOSCOPY;  Service: Cardiovascular;   Laterality: N/A;   CARDIOVERSION N/A 05/12/2022   Procedure: CARDIOVERSION;  Surgeon: Sueanne Margarita, MD;  Location: [facility] ENDOSCOPY;  Service: Cardiovascular;  Laterality: N/A;   CHOLECYSTECTOMY OPEN  02/20/1989   COLONOSCOPY  06/23/2003   Dr. Laural Golden: pancolonic divericula, polyp, path unknown currently   COLONOSCOPY  06/22/2010   Dr. Oneida Alar: Normal TI, scattered diverticula in entire colon, small internal hemorrhoids, normal colon biopsies. Colonoscopy in 5-10 years.    COLONOSCOPY WITH PROPOFOL N/A 06/02/2021   pancolonic diverticulosis. Two 4-6 mm polyps in transverse colon. Sessile serrated and hyperplastic. 5 year surveillance if benefits outweigh the risks.   COLOSTOMY  05/23/1979   COLOSTOMY REVERSAL  11/21/1979   EP IMPLANTABLE DEVICE N/A 06/25/2016   Procedure: Lead Revision/Repair;  Surgeon: Will Meredith Leeds, MD;  Location: [place] CV LAB;  Service: Cardiovascular;  Laterality: N/A;   EP IMPLANTABLE DEVICE N/A 06/25/2016   Procedure: Pacemaker Implant;  Surgeon: Will Meredith Leeds, MD;  Location: [place] CV LAB;  Service: Cardiovascular;  Laterality: N/A;   EXCISIONAL HEMORRHOIDECTOMY  02/20/1969   EYE SURGERY Left 06/22/1998   "branch vein occlusion"   EYE SURGERY Left ~ 2001   "smoothed out wrinkle"   LEFT HEART CATH AND CORONARY ANGIOGRAPHY N/A 05/11/2022   Procedure: LEFT HEART CATH AND CORONARY ANGIOGRAPHY;  Surgeon: Burnell Blanks, MD;  Location: [place] CV LAB;  Service: Cardiovascular;  Laterality: N/A;   LEFT OOPHORECTOMY  05/23/1979   nicked bowel, peritonitis, colostomy; colostomy reversed 1981    LOWER EXTREMITY ANGIOGRAM N/A 01/03/2014   Procedure: LOWER EXTREMITY ANGIOGRAM;  Surgeon: Wellington Hampshire, MD;  Location: [place] CATH LAB;  Service: Cardiovascular;  Laterality: N/A;   Nuclear med stress test  10/21/2011   Small area of mild ischemia inferoapically.   PARTIAL HYSTERECTOMY  02/20/1969   left ovaries, then ovaries removed later due  tumors    POLYPECTOMY  06/02/2021   Procedure: POLYPECTOMY;  Surgeon: Eloise Harman, DO;  [facility];  Service: Endoscopy;;   right eye surgery  03/2021   RIGHT OOPHORECTOMY  02/20/1969   TEE WITHOUT CARDIOVERSION N/A 05/12/2022   Procedure: TRANSESOPHAGEAL ECHOCARDIOGRAM (TEE);  Surgeon: Sueanne Margarita, MD;  Location: [facility] ENDOSCOPY;  Service: Cardiovascular;  Laterality: N/A;   Social History   Socioeconomic History   Marital status: Married    Spouse name: Not on file  Number of children: Not on file   Years of education: Not on file   Highest education level: Not on file  Occupational History   Occupation: Retired    Fish farm manager: RETIRED    Comment: insurance billing  Tobacco Use   Smoking status: Never    Passive exposure: Never   Smokeless tobacco: Never   Tobacco comments:    Never smoked  Vaping Use   Vaping Use: Never used  Substance and Sexual Activity   Alcohol use: No    Alcohol/week: 0.0 standard drinks of alcohol   Drug use: No   Sexual activity: Never    Birth control/protection: Surgical    Comment: hyst  Other Topics Concern   Not on file  Social History Narrative   Not on file   Social Determinants of Health   Financial Resource Strain: Not on file  Food Insecurity: No Food Insecurity (05/10/2022)   Hunger Vital Sign    Worried About Running Out of Food in the Last Year: Never true    Ran Out of Food in the Last Year: Never true  Transportation Needs: No Transportation Needs (05/10/2022)   PRAPARE - Hydrologist (Medical): No    Lack of Transportation (Non-Medical): No  Physical Activity: Not on file  Stress: Not on file  Social Connections: Not on file  Intimate Partner Violence: Not At Risk (05/10/2022)   Humiliation, Afraid, Rape, and Kick questionnaire    Fear of Current or Ex-Partner: No    Emotionally Abused: No    Physically Abused: No    Sexually Abused: No   Current Outpatient Medications  on File Prior to Visit  Medication Sig Dispense Refill   acetaminophen (TYLENOL) 325 MG tablet Take 650 mg by mouth every 6 (six) hours as needed for headache.     amiodarone (PACERONE) 200 MG tablet Take 1 tablet (200 mg total) by mouth daily. 30 tablet 6   apixaban (ELIQUIS) 5 MG TABS tablet TAKE ONE TABLET BY MOUTH TWICE DAILY 60 tablet 5   atorvastatin (LIPITOR) 80 MG tablet TAKE ONE TABLET BY MOUTH EVERY DAY AT 6:00PM (Patient taking differently: Take 80 mg by mouth daily.) 90 tablet 3   calcitRIOL (ROCALTROL) 0.25 MCG capsule Take 0.25 mcg by mouth 3 (three) times a week.     carvedilol (COREG) 25 MG tablet Take 1 tablet (25 mg total) by mouth 2 (two) times daily. 60 tablet 6   Cholecalciferol (VITAMIN D3 PO) Take 2,000 Units by mouth every evening.     clopidogrel (PLAVIX) 75 MG tablet TAKE ONE TABLET ('75MG'$  TOTAL) BY MOUTH DAILY 90 tablet 3   Continuous Blood Gluc Receiver (FREESTYLE LIBRE 2 READER) DEVI 1 each by Does not apply route daily. 1 each 0   Continuous Blood Gluc Sensor (FREESTYLE LIBRE 2 SENSOR) MISC USE ONE FOR FOURTEEN DAYS 6 each 3   glucose blood (FREESTYLE PRECISION NEO TEST) test strip Use as instructed 100 each 3   Insulin NPH, Human,, Isophane, (NOVOLIN N FLEXPEN) 100 UNIT/ML Kiwkpen INJECT 25 UNITS in THE MORNING AND 15 UNITS BEFORE dinner 30 mL 1   levothyroxine (SYNTHROID) 112 MCG tablet TAKE ONE TABLET BY MOUTH ONCE DAILY 90 tablet 2   nitroGLYCERIN (NITROSTAT) 0.4 MG SL tablet Place 1 tablet (0.4 mg total) under the tongue every 5 (five) minutes x 3 doses as needed for chest pain (If not relief after 3rd dose, call 911 or go to ED). 25 tablet 0  olmesartan (BENICAR) 5 MG tablet Take 1 tablet (5 mg total) by mouth daily. (HOLDING CURRENTLY 07/06/2022)     rOPINIRole (REQUIP) 0.5 MG tablet Take 0.5 mg by mouth at bedtime.   0   Semaglutide, 2 MG/DOSE, 8 MG/3ML SOPN Inject 2 mg as directed once a week. (Patient taking differently: Inject 2 mg as directed once a week.  Wednesday) 9 mL 3   No current facility-administered medications on file prior to visit.   Allergies  Allergen Reactions   Penicillins Hives    Has patient had a PCN reaction causing immediate rash, facial/tongue/throat swelling, SOB or lightheadedness with hypotension: Yes Has patient had a PCN reaction causing severe rash involving mucus membranes or skin necrosis: No Has patient had a PCN reaction that required hospitalization No Has patient had a PCN reaction occurring within the last 10 years: No If all of the above answers are "NO", then may proceed with Cephalosporin use.   Percocet [Oxycodone-Acetaminophen] Nausea And Vomiting   Family History  Problem Relation Age of Onset   Heart disease Mother        deceased   Heart disease Father        deceased, heart disease   Diabetes Brother    Heart disease Brother    Thyroid disease Brother    Heart disease Sister    Heart disease Brother    Thyroid disease Brother    Lupus Daughter    Colon cancer Neg Hx    PE: BP 130/76 (BP Location: Left Arm, Patient Position: Sitting, Cuff Size: Normal)   Pulse 84   Ht '5\' 3"'$  (1.6 m)   Wt 136 lb 6.4 oz (61.9 kg)   SpO2 94%   BMI 24.16 kg/m  Wt Readings from Last 3 Encounters:  07/15/22 136 lb 6.4 oz (61.9 kg)  07/06/22 134 lb (60.8 kg)  06/02/22 136 lb 6.4 oz (61.9 kg)   Constitutional: normal weight, in NAD Eyes: no exophthalmos ENT: no masses palpated in neck, no cervical lymphadenopathy Cardiovascular: RRR, No MRG Respiratory: CTA B Musculoskeletal: no deformities Skin: no rashes Neurological: + tremor with outstretched hands - L>R  ASSESSMENT: 1. DM2, insulin-dependent, now more controlled, withcomplications - CAD, s/p NSTEMI 07/2015 and 03/2015, s/p PTCA and DES - CHF - PAF/flutter - PAD - cerebrovascular disease - s/p TIA - CKD  2. HL  3.  Hypothyroidism  PLAN:  1. Patient with longstanding, previously uncontrolled type 2 diabetes, with improved control on a  regimen of premixed insulin 2x a day and weekly GLP-1 receptor agonist.  Sugars improved after changing from Trulicity to [place]. -we previously discussed about adding an SGLT2 inhibitor in the past but I advised her to check with her nephrologist first.  She saw Dr. Theador Hawthorne in 04/2020 and he wanted [place] to hold off using an SGLT2 inhibitor as her kidney function was trending down.  The GFR was a little worse at last check in 05/2022. -At last visit, reviewing the CGM trends, sugars appears to be better controlled compared to previously, only with mild, infrequent, hyperglycemic peaks after dinner.  However, she lost a little bit more weight before last visit, approximately 4 pounds.  We did discuss about possibly backing off the Ozempic but she had 2 mg doses at home and I advised her to let me know when she finished these, so I can call in the 1 mg dose.  Weight is approximately stable since last visit.  At this visit she tells me that she  continues to 2 mg dose. -At last visit, HbA1c was better, at 6.0%, but 2.5 months ago she had another HbA1c that was slightly higher, at 6.4%. CGM interpretation: -At today's visit, we reviewed her CGM downloads: It appears that 94% of values are in target range (goal >70%), while 0% are higher than 180 (goal <25%), and 6% are lower than 70 (goal <4%).  The calculated average blood sugar is 102.  -Reviewing the CGM trends, sugars appear to be well-controlled, fluctuating mostly within the target range but with occasional lows especially overnight.  At today's visit we discussed about reducing both insulin doses and continuing the same dose of Ozempic since she tolerates it well and the weight is now stable. - I suggested to:  Patient Instructions  Please change:: - NPH/R 70/30 20 units in am and 8-10 units before dinner  Continue: - Ozempic 2 mg weekly  Continue Levothyroxine 112 mcg daily.  Take the thyroid hormone every day, with water, at least 30 minutes  before breakfast, separated by at least 4 hours from: - acid reflux medications - calcium - iron - multivitamins  Please return in 4-6 months.  - we checked her HbA1c: 5.8% (lower) - advised to check sugars at different times of the day - 4x a day, rotating check times - advised for yearly eye exams >> she is UTD - return to clinic in 4-6 months  2. HL -Reviewed latest lipid panel from 06/2021: 126/160/41/58 -LDL was slightly above our goal of less than 55 due to history of cardiovascular disease, triglycerides slightly high -She continues on Lipitor 80 mg daily-no side effects  3.  Hypothyroidism - latest thyroid labs reviewed with pt. >> normal: Lab Results  Component Value Date   TSH 0.571 03/17/2022  - she continues on LT4 112 mcg daily - pt feels good on this dose. - we discussed about taking the thyroid hormone every day, with water, >30 minutes before breakfast, separated by >4 hours from acid reflux medications, calcium, iron, multivitamins. Pt. is taking it correctly.  Philemon Kingdom, MD PhD [facility] Endocrinology

## 2022-07-16 ENCOUNTER — Other Ambulatory Visit: Payer: Self-pay

## 2022-07-16 DIAGNOSIS — E1121 Type 2 diabetes mellitus with diabetic nephropathy: Secondary | ICD-10-CM

## 2022-07-16 MED ORDER — SEMAGLUTIDE (2 MG/DOSE) 8 MG/3ML ~~LOC~~ SOPN: 2.0000 mg | PEN_INJECTOR | SUBCUTANEOUS | 3 refills | Status: DC

## 2022-07-20 ENCOUNTER — Telehealth: Payer: Self-pay

## 2022-07-20 ENCOUNTER — Other Ambulatory Visit (HOSPITAL_COMMUNITY): Payer: Self-pay

## 2022-07-20 NOTE — Telephone Encounter (Signed)
Patient Advocate Encounter   Received notification from [employer] that prior authorization is required for Ozempic (2 MG/DOSE) '8MG'$ /3ML pen-injectors  Submitted: 07-20-2022 Key [id number]  Status is pending

## 2022-07-22 DIAGNOSIS — I10 Essential (primary) hypertension: Secondary | ICD-10-CM | POA: Diagnosis not present

## 2022-07-22 DIAGNOSIS — Z1331 Encounter for screening for depression: Secondary | ICD-10-CM | POA: Diagnosis not present

## 2022-07-22 DIAGNOSIS — E1122 Type 2 diabetes mellitus with diabetic chronic kidney disease: Secondary | ICD-10-CM | POA: Diagnosis not present

## 2022-07-22 DIAGNOSIS — Z6824 Body mass index (BMI) 24.0-24.9, adult: Secondary | ICD-10-CM | POA: Diagnosis not present

## 2022-07-22 DIAGNOSIS — I2510 Atherosclerotic heart disease of native coronary artery without angina pectoris: Secondary | ICD-10-CM | POA: Diagnosis not present

## 2022-07-22 DIAGNOSIS — E782 Mixed hyperlipidemia: Secondary | ICD-10-CM | POA: Diagnosis not present

## 2022-07-22 DIAGNOSIS — E039 Hypothyroidism, unspecified: Secondary | ICD-10-CM | POA: Diagnosis not present

## 2022-07-22 DIAGNOSIS — I509 Heart failure, unspecified: Secondary | ICD-10-CM | POA: Diagnosis not present

## 2022-07-22 DIAGNOSIS — Z0000 Encounter for general adult medical examination without abnormal findings: Secondary | ICD-10-CM | POA: Diagnosis not present

## 2022-07-22 DIAGNOSIS — I130 Hypertensive heart and chronic kidney disease with heart failure and stage 1 through stage 4 chronic kidney disease, or unspecified chronic kidney disease: Secondary | ICD-10-CM | POA: Diagnosis not present

## 2022-07-22 DIAGNOSIS — N184 Chronic kidney disease, stage 4 (severe): Secondary | ICD-10-CM | POA: Diagnosis not present

## 2022-07-23 ENCOUNTER — Ambulatory Visit: Payer: PPO | Attending: Cardiology

## 2022-07-23 ENCOUNTER — Other Ambulatory Visit (HOSPITAL_COMMUNITY): Payer: Self-pay

## 2022-07-23 DIAGNOSIS — I480 Paroxysmal atrial fibrillation: Secondary | ICD-10-CM

## 2022-07-24 ENCOUNTER — Other Ambulatory Visit (HOSPITAL_COMMUNITY): Payer: Self-pay

## 2022-07-24 LAB — BASIC METABOLIC PANEL
BUN/Creatinine Ratio: 15 (ref 12–28)
BUN: 21 mg/dL (ref 8–27)
CO2: 25 mmol/L (ref 20–29)
Calcium: 9.2 mg/dL (ref 8.7–10.3)
Chloride: 104 mmol/L (ref 96–106)
Creatinine, Ser: 1.43 mg/dL — ABNORMAL HIGH (ref 0.57–1.00)
Glucose: 111 mg/dL — ABNORMAL HIGH (ref 70–99)
Potassium: 3.3 mmol/L — ABNORMAL LOW (ref 3.5–5.2)
Sodium: 143 mmol/L (ref 134–144)
eGFR: 38 mL/min/{1.73_m2} — ABNORMAL LOW (ref >59)

## 2022-07-24 LAB — CBC
Hematocrit: 38 % (ref 34.0–46.6)
Hemoglobin: 12 g/dL (ref 11.1–15.9)
MCH: 28.6 pg (ref 26.6–33.0)
MCHC: 31.6 g/dL (ref 31.5–35.7)
MCV: 91 fL (ref 79–97)
Platelets: 256 10*3/uL (ref 150–450)
RBC: 4.2 x10E6/uL (ref 3.77–5.28)
RDW: 14.5 % (ref 11.7–15.4)
WBC: 7 10*3/uL (ref 3.4–10.8)

## 2022-07-27 ENCOUNTER — Encounter: Payer: Self-pay | Admitting: Cardiology

## 2022-07-29 ENCOUNTER — Other Ambulatory Visit: Payer: Self-pay

## 2022-07-29 DIAGNOSIS — I480 Paroxysmal atrial fibrillation: Secondary | ICD-10-CM

## 2022-07-30 DIAGNOSIS — E1129 Type 2 diabetes mellitus with other diabetic kidney complication: Secondary | ICD-10-CM | POA: Diagnosis not present

## 2022-07-30 DIAGNOSIS — E1122 Type 2 diabetes mellitus with diabetic chronic kidney disease: Secondary | ICD-10-CM | POA: Diagnosis not present

## 2022-07-30 DIAGNOSIS — R809 Proteinuria, unspecified: Secondary | ICD-10-CM | POA: Diagnosis not present

## 2022-07-30 DIAGNOSIS — N189 Chronic kidney disease, unspecified: Secondary | ICD-10-CM | POA: Diagnosis not present

## 2022-07-30 DIAGNOSIS — E211 Secondary hyperparathyroidism, not elsewhere classified: Secondary | ICD-10-CM | POA: Diagnosis not present

## 2022-07-30 DIAGNOSIS — G4733 Obstructive sleep apnea (adult) (pediatric): Secondary | ICD-10-CM | POA: Diagnosis not present

## 2022-07-30 DIAGNOSIS — I5032 Chronic diastolic (congestive) heart failure: Secondary | ICD-10-CM | POA: Diagnosis not present

## 2022-07-30 DIAGNOSIS — I129 Hypertensive chronic kidney disease with stage 1 through stage 4 chronic kidney disease, or unspecified chronic kidney disease: Secondary | ICD-10-CM | POA: Diagnosis not present

## 2022-07-30 NOTE — Progress Notes (Signed)
Remote pacemaker transmission.   

## 2022-08-03 NOTE — Telephone Encounter (Signed)
Inbound call from insurance requesting a call back for PA for Ozempic at [phone]. DX code needed and some other information

## 2022-08-04 ENCOUNTER — Encounter: Payer: Self-pay | Admitting: Internal Medicine

## 2022-08-04 DIAGNOSIS — H348122 Central retinal vein occlusion, left eye, stable: Secondary | ICD-10-CM | POA: Diagnosis not present

## 2022-08-04 DIAGNOSIS — E113411 Type 2 diabetes mellitus with severe nonproliferative diabetic retinopathy with macular edema, right eye: Secondary | ICD-10-CM | POA: Diagnosis not present

## 2022-08-04 DIAGNOSIS — H353124 Nonexudative age-related macular degeneration, left eye, advanced atrophic with subfoveal involvement: Secondary | ICD-10-CM | POA: Diagnosis not present

## 2022-08-04 DIAGNOSIS — H34831 Tributary (branch) retinal vein occlusion, right eye, with macular edema: Secondary | ICD-10-CM | POA: Diagnosis not present

## 2022-08-04 LAB — HM DIABETES EYE EXAM

## 2022-08-05 ENCOUNTER — Telehealth (HOSPITAL_COMMUNITY): Payer: Self-pay | Admitting: Emergency Medicine

## 2022-08-05 NOTE — Telephone Encounter (Signed)
Reaching out to patient to offer assistance regarding upcoming cardiac imaging study; pt verbalizes understanding of appt date/time, parking situation and where to check in, pre-test NPO status and medications ordered, and verified current allergies; name and call back number provided for further questions should they arise Marchia Bond RN Navigator Cardiac [facility] [place] [facility] [phone] office [phone] cell  Arrival [address] Difficult IV Aware contrast Daily meds  PO hydration!

## 2022-08-07 ENCOUNTER — Ambulatory Visit (HOSPITAL_COMMUNITY)
Admission: RE | Admit: 2022-08-07 | Discharge: 2022-08-07 | Disposition: A | Discharge status: Home / Self Care | Payer: PPO | Source: Ambulatory Visit | Attending: Cardiology | Admitting: Cardiology

## 2022-08-07 DIAGNOSIS — I480 Paroxysmal atrial fibrillation: Secondary | ICD-10-CM | POA: Insufficient documentation

## 2022-08-07 MED ORDER — IOHEXOL 350 MG/ML SOLN
75.0000 mL | Freq: Once | INTRAVENOUS | Status: AC | PRN
  Administered 2022-08-07: 75 mL via INTRAVENOUS

## 2022-08-07 MED ORDER — METOPROLOL TARTRATE 5 MG/5ML IV SOLN: 10.0000 mg | INTRAVENOUS | Status: DC | PRN

## 2022-08-09 ENCOUNTER — Encounter: Payer: Self-pay | Admitting: Cardiology

## 2022-08-11 ENCOUNTER — Telehealth: Payer: Self-pay | Admitting: Cardiology

## 2022-08-11 ENCOUNTER — Other Ambulatory Visit (HOSPITAL_COMMUNITY): Payer: Self-pay | Admitting: Adult Health

## 2022-08-11 DIAGNOSIS — E211 Secondary hyperparathyroidism, not elsewhere classified: Secondary | ICD-10-CM | POA: Diagnosis not present

## 2022-08-11 DIAGNOSIS — E1122 Type 2 diabetes mellitus with diabetic chronic kidney disease: Secondary | ICD-10-CM | POA: Diagnosis not present

## 2022-08-11 DIAGNOSIS — I129 Hypertensive chronic kidney disease with stage 1 through stage 4 chronic kidney disease, or unspecified chronic kidney disease: Secondary | ICD-10-CM | POA: Diagnosis not present

## 2022-08-11 DIAGNOSIS — E1129 Type 2 diabetes mellitus with other diabetic kidney complication: Secondary | ICD-10-CM | POA: Diagnosis not present

## 2022-08-11 DIAGNOSIS — N189 Chronic kidney disease, unspecified: Secondary | ICD-10-CM | POA: Diagnosis not present

## 2022-08-11 DIAGNOSIS — Z1231 Encounter for screening mammogram for malignant neoplasm of breast: Secondary | ICD-10-CM

## 2022-08-11 DIAGNOSIS — R809 Proteinuria, unspecified: Secondary | ICD-10-CM | POA: Diagnosis not present

## 2022-08-11 DIAGNOSIS — I5032 Chronic diastolic (congestive) heart failure: Secondary | ICD-10-CM | POA: Diagnosis not present

## 2022-08-11 NOTE — Telephone Encounter (Signed)
Pt returning call from [place].

## 2022-08-11 NOTE — Telephone Encounter (Signed)
See 2/18 phone note

## 2022-08-11 NOTE — Telephone Encounter (Signed)
Received fax that dx was needed along with chart notes/labs to show proof of Diabetes. Last chart notes have labs of history of A1C!

## 2022-08-11 NOTE — Telephone Encounter (Signed)
LM for pt to call me regarding this. Per Dr. Curt Bears, the CT could not rule out thrombus. He would like for the pt to have an EKG that morning and if in SR we will then cancel the TEE.   Lab is aware of this.

## 2022-08-11 NOTE — Telephone Encounter (Signed)
Pt is aware of Dr. Curt Bears plan.

## 2022-08-13 NOTE — Pre-Procedure Instructions (Signed)
Instructed patient on the following items: Arrival time 0800 Nothing to eat or drink after midnight No meds AM of procedure Responsible person to drive you home and stay with you for 24 hrs  Have you missed any doses of anti-coagulant Xarelto- hasn't missed any doses

## 2022-08-13 NOTE — Anesthesia Preprocedure Evaluation (Addendum)
Anesthesia Evaluation  Patient identified by MRN, date of birth, ID band Patient awake    Reviewed: Allergy & Precautions, H&P , NPO status , Patient's Chart, lab work & pertinent test results  History of Anesthesia Complications Negative for: history of anesthetic complications  Airway Mallampati: I  TM Distance: >3 FB Neck ROM: Full    Dental  (+) Missing, Teeth Intact, Dental Advisory Given,    Pulmonary neg pulmonary ROS, sleep apnea    Pulmonary exam normal        Cardiovascular hypertension, + CAD, + Past MI (2013), + Cardiac Stents (2017) and +CHF  negative cardio ROS Normal cardiovascular exam+ dysrhythmias Atrial Fibrillation + pacemaker   TTE 05/10/22: severe hypokinesis of the anteroseptal wall with overall mild LV  Dysfunction, EF 40-45%, severe LVH, valves ok    remote MI. b. h/o PTCA with scoring balloon to OM1 11/2014. c. NSTEMI 03/2015 s/p DES to prox-mid Cx. d. NSTEMI 07/2015 s/p scoring balloon/PTCA/DES to dRCA with PAF during that admission   Neuro/Psych TIAnegative neurological ROS  negative psych ROS   GI/Hepatic negative GI ROS, Neg liver ROS,GERD  ,,  Endo/Other  negative endocrine ROSdiabetes, Type 2Hypothyroidism    Renal/GU Renal InsufficiencyRenal disease (Cr 1.3)negative Renal ROS  negative genitourinary   Musculoskeletal negative musculoskeletal ROS (+) Arthritis ,    Abdominal   Peds negative pediatric ROS (+)  Hematology negative hematology ROS (+)   Anesthesia Other Findings Day of surgery medications reviewed with patient.  Reproductive/Obstetrics negative OB ROS                             Anesthesia Physical Anesthesia Plan  ASA: 4  Anesthesia Plan: General   Post-op Pain Management: Minimal or no pain anticipated   Induction: Intravenous  PONV Risk Score and Plan: 3 and Treatment may vary due to age or medical condition, Propofol infusion and  Ondansetron  Airway Management Planned: Oral ETT  Additional Equipment: None  Intra-op Plan:   Post-operative Plan: Extubation in OR  Informed Consent: I have reviewed the patients History and Physical, chart, labs and discussed the procedure including the risks, benefits and alternatives for the proposed anesthesia with the patient or authorized representative who has indicated his/her understanding and acceptance.     Dental advisory given  Plan Discussed with: CRNA and Anesthesiologist  Anesthesia Plan Comments:         Anesthesia Quick Evaluation

## 2022-08-14 ENCOUNTER — Encounter (HOSPITAL_COMMUNITY): Payer: Self-pay | Admitting: Cardiology

## 2022-08-14 ENCOUNTER — Ambulatory Visit (HOSPITAL_COMMUNITY): Payer: PPO

## 2022-08-14 ENCOUNTER — Ambulatory Visit (HOSPITAL_BASED_OUTPATIENT_CLINIC_OR_DEPARTMENT_OTHER): Payer: PPO | Admitting: Anesthesiology

## 2022-08-14 ENCOUNTER — Ambulatory Visit (HOSPITAL_COMMUNITY): Payer: PPO | Admitting: Anesthesiology

## 2022-08-14 ENCOUNTER — Other Ambulatory Visit: Payer: Self-pay

## 2022-08-14 ENCOUNTER — Encounter (HOSPITAL_COMMUNITY)
Admission: RE | Disposition: A | Discharge status: Home / Self Care | Payer: Self-pay | Source: Home / Self Care | Attending: Cardiology

## 2022-08-14 ENCOUNTER — Inpatient Hospital Stay (HOSPITAL_COMMUNITY)
Admission: RE | Admit: 2022-08-14 | Discharge: 2022-08-16 | DRG: 273 | Disposition: A | Discharge status: Home / Self Care | Payer: PPO | Attending: Cardiology | Admitting: Cardiology

## 2022-08-14 DIAGNOSIS — Z7901 Long term (current) use of anticoagulants: Secondary | ICD-10-CM

## 2022-08-14 DIAGNOSIS — Z955 Presence of coronary angioplasty implant and graft: Secondary | ICD-10-CM

## 2022-08-14 DIAGNOSIS — Z8673 Personal history of transient ischemic attack (TIA), and cerebral infarction without residual deficits: Secondary | ICD-10-CM

## 2022-08-14 DIAGNOSIS — I509 Heart failure, unspecified: Secondary | ICD-10-CM | POA: Diagnosis not present

## 2022-08-14 DIAGNOSIS — I4891 Unspecified atrial fibrillation: Secondary | ICD-10-CM | POA: Diagnosis present

## 2022-08-14 DIAGNOSIS — E1151 Type 2 diabetes mellitus with diabetic peripheral angiopathy without gangrene: Secondary | ICD-10-CM | POA: Diagnosis present

## 2022-08-14 DIAGNOSIS — I495 Sick sinus syndrome: Secondary | ICD-10-CM | POA: Diagnosis present

## 2022-08-14 DIAGNOSIS — I483 Typical atrial flutter: Secondary | ICD-10-CM | POA: Diagnosis present

## 2022-08-14 DIAGNOSIS — Z950 Presence of cardiac pacemaker: Secondary | ICD-10-CM | POA: Diagnosis not present

## 2022-08-14 DIAGNOSIS — L7682 Other postprocedural complications of skin and subcutaneous tissue: Secondary | ICD-10-CM | POA: Diagnosis not present

## 2022-08-14 DIAGNOSIS — I252 Old myocardial infarction: Secondary | ICD-10-CM

## 2022-08-14 DIAGNOSIS — I5023 Acute on chronic systolic (congestive) heart failure: Secondary | ICD-10-CM | POA: Diagnosis present

## 2022-08-14 DIAGNOSIS — Z833 Family history of diabetes mellitus: Secondary | ICD-10-CM

## 2022-08-14 DIAGNOSIS — I480 Paroxysmal atrial fibrillation: Secondary | ICD-10-CM | POA: Diagnosis not present

## 2022-08-14 DIAGNOSIS — H353130 Nonexudative age-related macular degeneration, bilateral, stage unspecified: Secondary | ICD-10-CM | POA: Diagnosis present

## 2022-08-14 DIAGNOSIS — M1990 Unspecified osteoarthritis, unspecified site: Secondary | ICD-10-CM | POA: Diagnosis present

## 2022-08-14 DIAGNOSIS — E119 Type 2 diabetes mellitus without complications: Secondary | ICD-10-CM | POA: Diagnosis not present

## 2022-08-14 DIAGNOSIS — E039 Hypothyroidism, unspecified: Secondary | ICD-10-CM | POA: Diagnosis present

## 2022-08-14 DIAGNOSIS — I2510 Atherosclerotic heart disease of native coronary artery without angina pectoris: Secondary | ICD-10-CM | POA: Diagnosis present

## 2022-08-14 DIAGNOSIS — N1832 Chronic kidney disease, stage 3b: Secondary | ICD-10-CM | POA: Diagnosis present

## 2022-08-14 DIAGNOSIS — I081 Rheumatic disorders of both mitral and tricuspid valves: Secondary | ICD-10-CM

## 2022-08-14 DIAGNOSIS — E7800 Pure hypercholesterolemia, unspecified: Secondary | ICD-10-CM | POA: Diagnosis present

## 2022-08-14 DIAGNOSIS — G4733 Obstructive sleep apnea (adult) (pediatric): Secondary | ICD-10-CM | POA: Diagnosis present

## 2022-08-14 DIAGNOSIS — R0602 Shortness of breath: Secondary | ICD-10-CM | POA: Diagnosis not present

## 2022-08-14 DIAGNOSIS — I110 Hypertensive heart disease with heart failure: Secondary | ICD-10-CM | POA: Diagnosis not present

## 2022-08-14 DIAGNOSIS — I4819 Other persistent atrial fibrillation: Secondary | ICD-10-CM | POA: Diagnosis present

## 2022-08-14 DIAGNOSIS — K219 Gastro-esophageal reflux disease without esophagitis: Secondary | ICD-10-CM | POA: Diagnosis present

## 2022-08-14 DIAGNOSIS — I340 Nonrheumatic mitral (valve) insufficiency: Secondary | ICD-10-CM

## 2022-08-14 DIAGNOSIS — Z885 Allergy status to narcotic agent status: Secondary | ICD-10-CM

## 2022-08-14 DIAGNOSIS — Y838 Other surgical procedures as the cause of abnormal reaction of the patient, or of later complication, without mention of misadventure at the time of the procedure: Secondary | ICD-10-CM | POA: Diagnosis not present

## 2022-08-14 DIAGNOSIS — J9811 Atelectasis: Secondary | ICD-10-CM | POA: Diagnosis not present

## 2022-08-14 DIAGNOSIS — Z880 Allergy status to penicillin: Secondary | ICD-10-CM

## 2022-08-14 DIAGNOSIS — I130 Hypertensive heart and chronic kidney disease with heart failure and stage 1 through stage 4 chronic kidney disease, or unspecified chronic kidney disease: Secondary | ICD-10-CM | POA: Diagnosis present

## 2022-08-14 DIAGNOSIS — I97618 Postprocedural hemorrhage and hematoma of a circulatory system organ or structure following other circulatory system procedure: Secondary | ICD-10-CM | POA: Diagnosis not present

## 2022-08-14 DIAGNOSIS — E1122 Type 2 diabetes mellitus with diabetic chronic kidney disease: Secondary | ICD-10-CM | POA: Diagnosis present

## 2022-08-14 DIAGNOSIS — L930 Discoid lupus erythematosus: Secondary | ICD-10-CM | POA: Diagnosis present

## 2022-08-14 DIAGNOSIS — Z8249 Family history of ischemic heart disease and other diseases of the circulatory system: Secondary | ICD-10-CM

## 2022-08-14 DIAGNOSIS — Z79899 Other long term (current) drug therapy: Secondary | ICD-10-CM

## 2022-08-14 HISTORY — PX: ATRIAL FIBRILLATION ABLATION: EP1191

## 2022-08-14 HISTORY — PX: TEE WITHOUT CARDIOVERSION: SHX5443

## 2022-08-14 LAB — BASIC METABOLIC PANEL
Anion gap: 7 (ref 5–15)
BUN: 25 mg/dL — ABNORMAL HIGH (ref 8–23)
CO2: 24 mmol/L (ref 22–32)
Calcium: 9.1 mg/dL (ref 8.9–10.3)
Chloride: 107 mmol/L (ref 98–111)
Creatinine, Ser: 1.63 mg/dL — ABNORMAL HIGH (ref 0.44–1.00)
GFR, Estimated: 32 mL/min — ABNORMAL LOW (ref >60)
Glucose, Bld: 136 mg/dL — ABNORMAL HIGH (ref 70–99)
Potassium: 4.6 mmol/L (ref 3.5–5.1)
Sodium: 138 mmol/L (ref 135–145)

## 2022-08-14 LAB — GLUCOSE, CAPILLARY
Glucose-Capillary: 131 mg/dL — ABNORMAL HIGH (ref 70–99)
Glucose-Capillary: 158 mg/dL — ABNORMAL HIGH (ref 70–99)
Glucose-Capillary: 228 mg/dL — ABNORMAL HIGH (ref 70–99)
Glucose-Capillary: 296 mg/dL — ABNORMAL HIGH (ref 70–99)
Glucose-Capillary: 248 mg/dL — ABNORMAL HIGH (ref 70–99)

## 2022-08-14 LAB — ECHO TEE
Height: 63 in
Weight: 2192 oz

## 2022-08-14 SURGERY — ATRIAL FIBRILLATION ABLATION
Anesthesia: General

## 2022-08-14 MED ORDER — SODIUM CHLORIDE 0.9% FLUSH
3.0000 mL | Freq: Two times a day (BID) | INTRAVENOUS | Status: DC
  Administered 2022-08-15 – 2022-08-16 (×3): 3 mL via INTRAVENOUS

## 2022-08-14 MED ORDER — LEVOTHYROXINE SODIUM 112 MCG PO TABS
112.0000 ug | ORAL_TABLET | Freq: Every day | ORAL | Status: DC
  Administered 2022-08-15 – 2022-08-16 (×2): 112 ug via ORAL
  Filled 2022-08-14 (×2): qty 1

## 2022-08-14 MED ORDER — LIDOCAINE 2% (20 MG/ML) 5 ML SYRINGE
INTRAMUSCULAR | Status: DC | PRN
  Administered 2022-08-14: 100 mg via INTRAVENOUS

## 2022-08-14 MED ORDER — NITROGLYCERIN 0.4 MG SL SUBL: 0.4000 mg | SUBLINGUAL_TABLET | SUBLINGUAL | Status: DC | PRN

## 2022-08-14 MED ORDER — PROTAMINE SULFATE 10 MG/ML IV SOLN
INTRAVENOUS | Status: DC | PRN
  Administered 2022-08-14: 40 mg via INTRAVENOUS

## 2022-08-14 MED ORDER — AMIODARONE HCL 200 MG PO TABS
200.0000 mg | ORAL_TABLET | Freq: Every day | ORAL | Status: DC
  Administered 2022-08-14 – 2022-08-16 (×3): 200 mg via ORAL
  Filled 2022-08-14 (×3): qty 1

## 2022-08-14 MED ORDER — VANCOMYCIN HCL 1000 MG IV SOLR
INTRAVENOUS | Status: DC | PRN
  Administered 2022-08-14: 1000 mg via INTRAVENOUS

## 2022-08-14 MED ORDER — HEPARIN SODIUM (PORCINE) 1000 UNIT/ML IJ SOLN
INTRAMUSCULAR | Status: AC
  Filled 2022-08-14: qty 10

## 2022-08-14 MED ORDER — HEPARIN SODIUM (PORCINE) 1000 UNIT/ML IJ SOLN
INTRAMUSCULAR | Status: DC | PRN
  Administered 2022-08-14: 1000 [IU] via INTRAVENOUS

## 2022-08-14 MED ORDER — PHENYLEPHRINE HCL-NACL 20-0.9 MG/250ML-% IV SOLN
INTRAVENOUS | Status: DC | PRN
  Administered 2022-08-14: 25 ug/min via INTRAVENOUS

## 2022-08-14 MED ORDER — SODIUM CHLORIDE 0.9 % IV SOLN: INTRAVENOUS | Status: DC

## 2022-08-14 MED ORDER — INSULIN ASPART 100 UNIT/ML IJ SOLN
0.0000 [IU] | Freq: Three times a day (TID) | INTRAMUSCULAR | Status: DC
  Administered 2022-08-14: 5 [IU] via SUBCUTANEOUS
  Administered 2022-08-15: 3 [IU] via SUBCUTANEOUS
  Administered 2022-08-15: 5 [IU] via SUBCUTANEOUS
  Administered 2022-08-15: 3 [IU] via SUBCUTANEOUS
  Administered 2022-08-16: 1 [IU] via SUBCUTANEOUS
  Administered 2022-08-16: 3 [IU] via SUBCUTANEOUS

## 2022-08-14 MED ORDER — CLOPIDOGREL BISULFATE 75 MG PO TABS
75.0000 mg | ORAL_TABLET | Freq: Every day | ORAL | Status: DC
  Administered 2022-08-14 – 2022-08-16 (×3): 75 mg via ORAL
  Filled 2022-08-14 (×3): qty 1

## 2022-08-14 MED ORDER — ROPINIROLE HCL 0.5 MG PO TABS
0.5000 mg | ORAL_TABLET | Freq: Every day | ORAL | Status: DC
  Administered 2022-08-14 – 2022-08-15 (×2): 0.5 mg via ORAL
  Filled 2022-08-14 (×3): qty 1

## 2022-08-14 MED ORDER — HEPARIN SODIUM (PORCINE) 1000 UNIT/ML IJ SOLN
INTRAMUSCULAR | Status: DC | PRN
  Administered 2022-08-14: 14000 [IU] via INTRAVENOUS

## 2022-08-14 MED ORDER — SODIUM CHLORIDE 0.9% FLUSH: 3.0000 mL | INTRAVENOUS | Status: DC | PRN

## 2022-08-14 MED ORDER — ONDANSETRON HCL 4 MG/2ML IJ SOLN: 4.0000 mg | Freq: Four times a day (QID) | INTRAMUSCULAR | Status: DC | PRN

## 2022-08-14 MED ORDER — SUGAMMADEX SODIUM 200 MG/2ML IV SOLN
INTRAVENOUS | Status: DC | PRN
  Administered 2022-08-14: 200 mg via INTRAVENOUS

## 2022-08-14 MED ORDER — FUROSEMIDE 10 MG/ML IJ SOLN
20.0000 mg | Freq: Once | INTRAMUSCULAR | Status: AC
  Administered 2022-08-14: 20 mg via INTRAVENOUS

## 2022-08-14 MED ORDER — PHENYLEPHRINE 80 MCG/ML (10ML) SYRINGE FOR IV PUSH (FOR BLOOD PRESSURE SUPPORT)
PREFILLED_SYRINGE | INTRAVENOUS | Status: DC | PRN
  Administered 2022-08-14: 160 ug via INTRAVENOUS

## 2022-08-14 MED ORDER — CARVEDILOL 25 MG PO TABS: 25.0000 mg | ORAL_TABLET | Freq: Two times a day (BID) | ORAL | Status: DC

## 2022-08-14 MED ORDER — HEPARIN (PORCINE) IN NACL 1000-0.9 UT/500ML-% IV SOLN
INTRAVENOUS | Status: DC | PRN
  Administered 2022-08-14 (×3): 500 mL

## 2022-08-14 MED ORDER — ONDANSETRON HCL 4 MG/2ML IJ SOLN
INTRAMUSCULAR | Status: DC | PRN
  Administered 2022-08-14: 4 mg via INTRAVENOUS

## 2022-08-14 MED ORDER — DOBUTAMINE INFUSION FOR EP/ECHO/NUC (1000 MCG/ML)
INTRAVENOUS | Status: AC
  Filled 2022-08-14: qty 250

## 2022-08-14 MED ORDER — DOBUTAMINE INFUSION FOR EP/ECHO/NUC (1000 MCG/ML)
INTRAVENOUS | Status: DC | PRN
  Administered 2022-08-14: 20 ug/kg/min via INTRAVENOUS

## 2022-08-14 MED ORDER — ATORVASTATIN CALCIUM 80 MG PO TABS
80.0000 mg | ORAL_TABLET | Freq: Every day | ORAL | Status: DC
  Administered 2022-08-14 – 2022-08-16 (×3): 80 mg via ORAL
  Filled 2022-08-14 (×3): qty 1

## 2022-08-14 MED ORDER — SODIUM CHLORIDE 0.9 % IV SOLN: 250.0000 mL | INTRAVENOUS | Status: DC | PRN

## 2022-08-14 MED ORDER — APIXABAN 5 MG PO TABS
5.0000 mg | ORAL_TABLET | Freq: Once | ORAL | Status: AC
  Administered 2022-08-14: 5 mg via ORAL
  Filled 2022-08-14: qty 1

## 2022-08-14 MED ORDER — FUROSEMIDE 10 MG/ML IJ SOLN
20.0000 mg | Freq: Two times a day (BID) | INTRAMUSCULAR | Status: DC
  Administered 2022-08-15 – 2022-08-16 (×4): 20 mg via INTRAVENOUS
  Filled 2022-08-14 (×4): qty 2

## 2022-08-14 MED ORDER — FUROSEMIDE 10 MG/ML IJ SOLN
INTRAMUSCULAR | Status: AC
  Filled 2022-08-14: qty 2

## 2022-08-14 MED ORDER — ROCURONIUM BROMIDE 10 MG/ML (PF) SYRINGE
PREFILLED_SYRINGE | INTRAVENOUS | Status: DC | PRN
  Administered 2022-08-14: 60 mg via INTRAVENOUS

## 2022-08-14 MED ORDER — APIXABAN 5 MG PO TABS
5.0000 mg | ORAL_TABLET | Freq: Two times a day (BID) | ORAL | Status: DC
  Administered 2022-08-14 – 2022-08-15 (×2): 5 mg via ORAL
  Filled 2022-08-14 (×2): qty 1

## 2022-08-14 MED ORDER — CARVEDILOL 25 MG PO TABS
25.0000 mg | ORAL_TABLET | Freq: Two times a day (BID) | ORAL | Status: DC
  Administered 2022-08-14 – 2022-08-16 (×4): 25 mg via ORAL
  Filled 2022-08-14 (×4): qty 1

## 2022-08-14 MED ORDER — DEXAMETHASONE SODIUM PHOSPHATE 10 MG/ML IJ SOLN
INTRAMUSCULAR | Status: DC | PRN
  Administered 2022-08-14: 10 mg via INTRAVENOUS

## 2022-08-14 MED ORDER — VANCOMYCIN HCL IN DEXTROSE 1-5 GM/200ML-% IV SOLN
INTRAVENOUS | Status: AC
  Filled 2022-08-14: qty 200

## 2022-08-14 MED ORDER — PROPOFOL 10 MG/ML IV BOLUS
INTRAVENOUS | Status: DC | PRN
  Administered 2022-08-14: 130 mg via INTRAVENOUS

## 2022-08-14 SURGICAL SUPPLY — 18 items
CATH ABLAT QDOT MICRO BI TC DF (CATHETERS) IMPLANT
CATH GE 8FR SOUNDSTAR (CATHETERS) IMPLANT
CATH OCTARAY 2.0 F 3-3-3-3-3 (CATHETERS) IMPLANT
CATH PIGTAIL STEERABLE D1 8.7 (WIRE) IMPLANT
CATH S-M CIRCA TEMP PROBE (CATHETERS) IMPLANT
CATH WEB BI DIR CSDF CRV REPRO (CATHETERS) IMPLANT
CLOSURE PERCLOSE PROSTYLE (VASCULAR PRODUCTS) IMPLANT
COVER SWIFTLINK CONNECTOR (BAG) ×2 IMPLANT
PACK EP LATEX FREE (CUSTOM PROCEDURE TRAY) ×1
PACK EP LF (CUSTOM PROCEDURE TRAY) ×2 IMPLANT
PAD DEFIB RADIO PHYSIO CONN (PAD) ×2 IMPLANT
PATCH CARTO3 (PAD) IMPLANT
SHEATH CARTO VIZIGO SM CVD (SHEATH) IMPLANT
SHEATH PINNACLE 7F 10CM (SHEATH) IMPLANT
SHEATH PINNACLE 8F 10CM (SHEATH) IMPLANT
SHEATH PINNACLE 9F 10CM (SHEATH) IMPLANT
SHEATH PROBE COVER 6X72 (BAG) IMPLANT
TUBING SMART ABLATE COOLFLOW (TUBING) IMPLANT

## 2022-08-14 NOTE — Interval H&P Note (Signed)
History and Physical Interval Note:  08/14/2022 7:59 AM  Virginia Huber  has presented today for surgery, with the diagnosis of afib.  The various methods of treatment have been discussed with the patient and family. After consideration of risks, benefits and other options for treatment, the patient has consented to  Procedure(s): ATRIAL FIBRILLATION ABLATION (N/A) TRANSESOPHAGEAL ECHOCARDIOGRAM (N/A) as a surgical intervention.  The patient's history has been reviewed, patient examined, no change in status, stable for surgery.  I have reviewed the patient's chart and labs.  Questions were answered to the patient's satisfaction.     Skeet Latch, MD

## 2022-08-14 NOTE — Progress Notes (Signed)
Patient with SOB. She reports for about 4 days or so, DOE and intermittent increased SOB even at rest. Initially post procedure she did feel OK< though again SOB No CP, no groin site pain  R groin without hematoma, very slight superficial oozing, though not significant on my evaluation, re-dressed L groin stable  Lungs are diminished in the bases RRR, no rubs No edema  Pre-procedure TEE with reduction in her LVE Lasix IV and CXR ordered  Dr. Curt Bears to bedside Plan to admit for observation And further diuresis if needed  Tommye Standard, PA-C

## 2022-08-14 NOTE — Anesthesia Postprocedure Evaluation (Signed)
Anesthesia Post Note  Patient: Virginia Huber  Procedure(s) Performed: ATRIAL FIBRILLATION ABLATION TRANSESOPHAGEAL ECHOCARDIOGRAM     Anesthesia Type: General Anesthetic complications: no  There were no known notable events for this encounter.  Last Vitals:  Vitals:   08/14/22 1130 08/14/22 1201  BP: (!) 155/66 (!) 158/72  Pulse: 60 60  Resp: (!) 22 18  Temp:    SpO2: 92% 94%    Last Pain:  Vitals:   08/14/22 1032  TempSrc: Temporal  PainSc:                  [name]

## 2022-08-14 NOTE — Transfer of Care (Signed)
Immediate Anesthesia Transfer of Care Note  Patient: Virginia Huber  Procedure(s) Performed: ATRIAL FIBRILLATION ABLATION TRANSESOPHAGEAL ECHOCARDIOGRAM  Patient Location: Cath Lab  Anesthesia Type:General  Level of Consciousness: awake, alert , and oriented  Airway & Oxygen Therapy: Patient Spontanous Breathing and Patient connected to nasal cannula oxygen  Post-op Assessment: Report given to RN, Post -op Vital signs reviewed and stable, and Patient moving all extremities  Post vital signs: Reviewed and stable  Last Vitals:  Vitals Value Taken Time  BP 151/54 08/14/22 1000  Temp    Pulse 54 08/14/22 1004  Resp 24 08/14/22 1004  SpO2 93 % 08/14/22 1004  Vitals shown include unvalidated device data.  Last Pain:  Vitals:   08/14/22 0606  TempSrc:   PainSc: 0-No pain         Complications: There were no known notable events for this encounter.

## 2022-08-14 NOTE — Anesthesia Procedure Notes (Signed)
Procedure Name: Intubation Date/Time: 08/14/2022 8:03 AM  Performed by: Kyung Rudd, CRNAPre-anesthesia Checklist: Patient identified, Emergency Drugs available, Suction available and Patient being monitored Patient Re-evaluated:Patient Re-evaluated prior to induction Oxygen Delivery Method: Circle system utilized Preoxygenation: Pre-oxygenation with 100% oxygen Induction Type: IV induction Ventilation: Mask ventilation without difficulty Laryngoscope Size: Mac and 3 Grade View: Grade I Tube type: Oral Tube size: 7.0 mm Number of attempts: 1 Airway Equipment and Method: Stylet Placement Confirmation: ETT inserted through vocal cords under direct vision, positive ETCO2 and breath sounds checked- equal and bilateral Secured at: 21 cm Tube secured with: Tape Dental Injury: Teeth and Oropharynx as per pre-operative assessment

## 2022-08-14 NOTE — Discharge Instructions (Signed)
Post procedure care instructions No driving for 4 days. No lifting over 5 lbs for 1 week. No vigorous or sexual activity for 1 week. You may return to work/your usual activities on 08/22/22. Keep procedure site clean & dry. If you notice increased pain, swelling, bleeding or pus, call/return!  You may shower after 24 hours, but no soaking in baths/hot tubs/pools for 1 week.   You have an appointment set up with the [facility].  Multiple studies have shown that being followed by a dedicated atrial fibrillation clinic in addition to the standard care you receive from your other physicians improves health. We believe that enrollment in the atrial fibrillation clinic will allow [place] to better care for you.   The phone number to the [facility] is [phone]. The clinic is staffed Monday through Friday from 8:30am to 5pm.  Directions: The clinic is located in the [facility], [place] the hospital at the MAIN ENTRANCE "A", use [address] to the 6th floor.  Registration desk to the right of elevators on 6th floor  If you have any trouble locating the clinic, please don't hesitate to call [phone].

## 2022-08-14 NOTE — Progress Notes (Signed)
Up and walked to bathroom and bleeding noted bilat groins, dressings changed and cont small amt bleeding noted right groin and dressing changed again; client c/o shortness of breath with walking and  using accessory muscles to breathe; Dr Curt Bears notified and per Dr Curt Bears, additional hour of bedrest needed

## 2022-08-14 NOTE — CV Procedure (Signed)
Brief TEE Note  LVEF 30-35%.  Global hypokinesis. Mild MR Mild TR No LA/LAA thrombus or masses.  For additional detail see full report.  Allanah Mcfarland C. Oval Linsey, MD, [facility] 08/14/2022 8:26 AM

## 2022-08-14 NOTE — Progress Notes (Signed)
Patient arrived at the unit,CHG bath given,Vitals taken BL incision site level 0.patient oriented to the unit

## 2022-08-14 NOTE — Progress Notes (Signed)
Dr Curt Bears and Joseph Art, PA into see client and client to be admitted

## 2022-08-14 NOTE — H&P (Signed)
Electrophysiology Office Note   Date:  08/14/2022   ID:  Zari, Steffensmeier [birth year], MRN [id number]  PCP:  Sharilyn Sites, MD  Cardiologist:  Bronson Ing Primary Electrophysiologist:  Kaige Whistler Meredith Leeds, MD    No chief complaint on file.     History of Present Illness: Virginia Huber is a 77 y.o. female who presents today for electrophysiology evaluation.     She has a history significant for coronary artery disease post PCI to the OM1 with STEMI in 2016, PCI to the circumflex with non-STEMI 2017, PCI to the RCA.  She has atrial fibrillation on admission.  Currently on Eliquis.  She has mild sleep apnea.  She is post Medtronic dual-chamber pacemaker implanted 06/25/2016.  She had a recent hospitalization for rapid atrial fibrillation.  She is currently on amiodarone.  She required TEE and cardioversion.  She is scheduled for ablation for atrial fibrillation February 2024.  Today, denies symptoms of palpitations, chest pain, shortness of breath, orthopnea, PND, lower extremity edema, claudication, dizziness, presyncope, syncope, bleeding, or neurologic sequela. The patient is tolerating medications without difficulties. Plan ablaton today.   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 h/o PAF/flutter during admission in 2013 for PNA. b. PAF during adm for NSTEMI 07/2015, subsequent paroxysms since then.   B12 deficiency anemia    Branch retinal vein occlusion with macular edema of right eye 10/11/2019   Chronic renal failure, stage 3b ([place]) 09/23/2017   Coronary artery disease 11/30/2014   a. remote MI. b. h/o PTCA with scoring balloon to OM1 11/2014. c. NSTEMI 03/2015 s/p DES  to prox-mid Cx. d. NSTEMI 07/2015 s/p scoring balloon/PTCA/DES to dRCA with PAF during that admission   Cutaneous lupus erythematosus    Early stage nonexudative age-related macular degeneration of right eye 02/27/2020   Essential hypertension    GERD (gastroesophageal reflux disease)    History of blood transfusion 1980's   2nd surgical procedures   Hypercholesteremia    Hypothyroidism    Myocardial infarction ([place]) 02/2012   NSTEMI (non-ST elevated myocardial infarction) ([place]) 04/02/2015   OSA (obstructive sleep apnea) 05/13/2016   Ovarian tumor    PAD (peripheral artery disease) ([place])    a. s/p LE angio 2015; followed by Dr. Fletcher Anon - managed medically.   Pericardial effusion    a. 06/2016 after ppm - s/p pericardiocentesis.   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 s/p Medtronic PPM 06/2016, c/b lead perf/pericardial effusion.   TIA (transient ischemic attack)    Type 2 diabetes with nephropathy ([place]) 02/29/2012   Past Surgical History:  Procedure Laterality Date   ABDOMINAL AORTAGRAM N/A 01/03/2014   Procedure: ABDOMINAL [name];  Surgeon: Wellington Hampshire, MD;  Location: [facility] CATH LAB;  Service: Cardiovascular;  Laterality: N/A;   ABDOMINAL HYSTERECTOMY  06/22/1970   "partial"   APPENDECTOMY  02/20/1969   CARDIAC CATHETERIZATION  06/22/2006   Tiny OM-2 with 90% narrowing. Med tx.   CARDIAC CATHETERIZATION N/A 11/30/2014   Procedure: Left Heart Cath and Coronary Angiography;  Surgeon: Troy Sine, MD; LAD 20%, CFX 50%, OM1 95%, right PLB 30%, LV normal    CARDIAC CATHETERIZATION N/A 11/30/2014   Procedure:  Coronary Balloon Angioplasty;  Surgeon: Troy Sine, MD;  Angiosculpt scoring balloon and PTCA to the OM1 reducing stenosis from 95% to less than 10%   CARDIAC CATHETERIZATION N/A 04/03/2015   Procedure: Left Heart Cath and Coronary Angiography;  Surgeon: Jolaine Artist, MD; dLAD 50%, CFX 90%, OM1 100%, PLA 15%, LVEDP 13     CARDIAC CATHETERIZATION N/A 04/03/2015   Procedure: Coronary Stent Intervention;  Surgeon: Sherren Mocha, MD; 3.0x18 mm Xience DES to the CFX     CARDIAC CATHETERIZATION N/A 08/02/2015   Procedure: Left Heart Cath and Coronary Angiography;  Surgeon: Troy Sine, MD;  Location: [facility] CV LAB;  Service: Cardiovascular;  Laterality: N/A;   CARDIAC CATHETERIZATION N/A 08/02/2015   Procedure: Coronary Stent Intervention;  Surgeon: Troy Sine, MD;  Location: [place] CV LAB;  Service: Cardiovascular;  Laterality: N/A;   CARDIAC CATHETERIZATION N/A 06/25/2016   Procedure: Pericardiocentesis;  Surgeon: Kambria Grima Meredith Leeds, MD;  Location: [place] CV LAB;  Service: Cardiovascular;  Laterality: N/A;   cardiac stents     CARDIOVERSION N/A 12/15/2017   Procedure: CARDIOVERSION;  Surgeon: Acie Fredrickson, Wonda Cheng, MD;  Location: [facility] ENDOSCOPY;  Service: Cardiovascular;  Laterality: N/A;   CARDIOVERSION N/A 05/12/2022   Procedure: CARDIOVERSION;  Surgeon: Sueanne Margarita, MD;  Location: [facility] ENDOSCOPY;  Service: Cardiovascular;  Laterality: N/A;   CHOLECYSTECTOMY OPEN  02/20/1989   COLONOSCOPY  06/23/2003   Dr. Laural Golden: pancolonic divericula, polyp, path unknown currently   COLONOSCOPY  06/22/2010   Dr. Oneida Alar: Normal TI, scattered diverticula in entire colon, small internal hemorrhoids, normal colon biopsies. Colonoscopy in 5-10 years.    COLONOSCOPY WITH PROPOFOL N/A 06/02/2021   pancolonic diverticulosis. Two 4-6 mm polyps in transverse colon. Sessile serrated and hyperplastic. 5 year surveillance if benefits outweigh the risks.   COLOSTOMY  05/23/1979   COLOSTOMY  REVERSAL  11/21/1979   EP IMPLANTABLE DEVICE N/A 06/25/2016   Procedure: Lead Revision/Repair;  Surgeon: Zhanna Melin Meredith Leeds, MD;  Location: [place] CV LAB;  Service: Cardiovascular;  Laterality: N/A;   EP IMPLANTABLE DEVICE N/A 06/25/2016   Procedure: Pacemaker Implant;  Surgeon: Josilynn Losh Meredith Leeds, MD;  Location: [place] CV LAB;  Service: Cardiovascular;  Laterality: N/A;   EXCISIONAL HEMORRHOIDECTOMY  02/20/1969   EYE SURGERY Left 06/22/1998   "branch vein occlusion"   EYE SURGERY Left ~ 2001   "smoothed out wrinkle"   LEFT HEART CATH AND CORONARY ANGIOGRAPHY N/A 05/11/2022   Procedure: LEFT HEART CATH AND CORONARY ANGIOGRAPHY;  Surgeon: Burnell Blanks, MD;  Location: [place] CV LAB;  Service: Cardiovascular;  Laterality: N/A;   LEFT OOPHORECTOMY  05/23/1979   nicked bowel, peritonitis, colostomy; colostomy reversed 1981    LOWER EXTREMITY ANGIOGRAM N/A 01/03/2014   Procedure: LOWER EXTREMITY ANGIOGRAM;  Surgeon: Wellington Hampshire, MD;  Location: [facility] CATH LAB;  Service: Cardiovascular;  Laterality: N/A;   Nuclear med stress test  10/21/2011   Small area of mild ischemia inferoapically.   PARTIAL HYSTERECTOMY  02/20/1969   left ovaries, then ovaries removed later due tumors    POLYPECTOMY  06/02/2021   Procedure: POLYPECTOMY;  Surgeon: Eloise Harman,  DO;  [facility];  Service: Endoscopy;;   right eye surgery  03/2021   RIGHT OOPHORECTOMY  02/20/1969   TEE WITHOUT CARDIOVERSION N/A 05/12/2022   Procedure: TRANSESOPHAGEAL ECHOCARDIOGRAM (TEE);  Surgeon: Sueanne Margarita, MD;  Location: [facility] ENDOSCOPY;  Service: Cardiovascular;  Laterality: N/A;     Current Facility-Administered Medications  Medication Dose Route Frequency Provider Last Rate Last Admin   0.9 %  sodium chloride infusion   Intravenous Continuous Constance Haw, MD 50 mL/hr at 08/14/22 0616 New Bag at 08/14/22 0616    Allergies:   Penicillins and Percocet [oxycodone-acetaminophen]    Social History:  The patient  reports that she has never smoked. She has never been exposed to tobacco smoke. She has never used smokeless tobacco. She reports that she does not drink alcohol and does not use drugs.   Family History:  The patient's family history includes Diabetes in her brother; Heart disease in her brother, brother, father, mother, and sister; Lupus in her daughter; Thyroid disease in her brother and brother.   ROS:  Please see the history of present illness.   Otherwise, review of systems is positive for none.   All other systems are reviewed and negative.   PHYSICAL EXAM: VS:  BP (!) 129/92   Pulse 92   Temp (!) 96.5 F (35.8 C) (Temporal)   Resp 18   Ht '5\' 3"'$  (1.6 m)   Wt 62.1 kg   SpO2 95%   BMI 24.27 kg/m  , BMI Body mass index is 24.27 kg/m. GEN: Well nourished, well developed, in no acute distress  HEENT: normal  Neck: no JVD, carotid bruits, or masses Cardiac: irregular; no murmurs, rubs, or gallops,no edema  Respiratory:  clear to auscultation bilaterally, normal work of breathing GI: soft, nontender, nondistended, + BS MS: no deformity or atrophy  Skin: warm and dry Neuro:  Strength and sensation are intact Psych: euthymic mood, full affect  EKG: atrial flutter  Recent Labs: 03/17/2022: ALT 27; TSH 0.571 05/10/2022: Magnesium 1.8 07/23/2022: BUN 21; Creatinine, Ser 1.43; Hemoglobin 12.0; Platelets 256; Potassium 3.3; Sodium 143    Lipid Panel     Component Value Date/Time   CHOL 96 05/10/2022 0158   TRIG 156 (H) 05/10/2022 0158   HDL 30 (L) 05/10/2022 0158   CHOLHDL 3.2 05/10/2022 0158   VLDL 31 05/10/2022 0158   LDLCALC 35 05/10/2022 0158   LDLCALC 96 08/05/2017 0807     Wt Readings from Last 3 Encounters:  08/14/22 62.1 kg  07/15/22 61.9 kg  07/06/22 60.8 kg      Other studies Reviewed: Additional studies/ records that were reviewed today include: TTE 08/04/17 Review of the above records today demonstrates:  - Left ventricle:  The cavity size was normal. Wall thickness was   increased in a pattern of moderate LVH. Systolic function was   normal. The estimated ejection fraction was in the range of 55%   to 60%. Wall motion was normal; there were no regional wall   motion abnormalities. Features are consistent with a pseudonormal   left ventricular filling pattern, with concomitant abnormal   relaxation and increased filling pressure (grade 2 diastolic   dysfunction). Doppler parameters are consistent with high   ventricular filling pressure. - Left atrium: The atrium was severely dilated. - Right ventricle: Pacer wire or catheter noted in right ventricle. - Right atrium: Pacer wire or catheter noted in right atrium.  Cath  08/02/15 1st RPLB lesion, 50% stenosed. Dist  RCA lesion, 30% stenosed. Ost 1st Mrg to 1st Mrg lesion, 100% stenosed. Ost LAD lesion, 40% stenosed. Mid LAD lesion, 40% stenosed. Post Atrio lesion, 90% stenosed. Post intervention, there is a 0% residual stenosis. The left ventricular systolic function is normal. Mid RCA lesion, 20% stenosed.   Normal LV function without focal segmental wall motion abnormalities and ejection fraction of 55%.   Successful percutaneous cardiac intervention to the distal RCA treated with Angiosculpt scoring balloon, PTCA, and ultimate stenting with a 2.58 mm Xience Alpine DES stent postdilated to 2.51 mm with a 90% stenosis reduced to 0% and no change in the ostial PDA narrowing.  ASSESSMENT AND PLAN:  1.  Persistent atrial fibrillation: [name] has presented today for surgery, with the diagnosis of AF.  The various methods of treatment have been discussed with the patient and family. After consideration of risks, benefits and other options for treatment, the patient has consented to  Procedure(s): Catheter ablation as a surgical intervention .  Risks include but not limited to complete heart block, stroke, esophageal damage, nerve damage, bleeding,  vascular damage, tamponade, perforation, MI, and death. The patient's history has been reviewed, patient examined, no change in status, stable for surgery.  I have reviewed the patient's chart and labs.  Questions were answered to the patient's satisfaction.    Anais Denslow Curt Bears, MD 08/14/2022 7:09 AM

## 2022-08-14 NOTE — Progress Notes (Signed)
Client cont to c/o shortness of breath and Renee, PA notified and right groin with continued small amt bleeding noted

## 2022-08-15 DIAGNOSIS — L7682 Other postprocedural complications of skin and subcutaneous tissue: Secondary | ICD-10-CM

## 2022-08-15 DIAGNOSIS — Y838 Other surgical procedures as the cause of abnormal reaction of the patient, or of later complication, without mention of misadventure at the time of the procedure: Secondary | ICD-10-CM | POA: Diagnosis not present

## 2022-08-15 DIAGNOSIS — I4891 Unspecified atrial fibrillation: Secondary | ICD-10-CM | POA: Diagnosis present

## 2022-08-15 DIAGNOSIS — I5023 Acute on chronic systolic (congestive) heart failure: Secondary | ICD-10-CM | POA: Diagnosis present

## 2022-08-15 DIAGNOSIS — M1990 Unspecified osteoarthritis, unspecified site: Secondary | ICD-10-CM | POA: Diagnosis present

## 2022-08-15 DIAGNOSIS — I97618 Postprocedural hemorrhage and hematoma of a circulatory system organ or structure following other circulatory system procedure: Secondary | ICD-10-CM | POA: Diagnosis not present

## 2022-08-15 DIAGNOSIS — I2510 Atherosclerotic heart disease of native coronary artery without angina pectoris: Secondary | ICD-10-CM | POA: Diagnosis present

## 2022-08-15 DIAGNOSIS — H353130 Nonexudative age-related macular degeneration, bilateral, stage unspecified: Secondary | ICD-10-CM | POA: Diagnosis present

## 2022-08-15 DIAGNOSIS — N1832 Chronic kidney disease, stage 3b: Secondary | ICD-10-CM | POA: Diagnosis present

## 2022-08-15 DIAGNOSIS — Z950 Presence of cardiac pacemaker: Secondary | ICD-10-CM | POA: Diagnosis not present

## 2022-08-15 DIAGNOSIS — I4819 Other persistent atrial fibrillation: Secondary | ICD-10-CM | POA: Diagnosis present

## 2022-08-15 DIAGNOSIS — L930 Discoid lupus erythematosus: Secondary | ICD-10-CM | POA: Diagnosis present

## 2022-08-15 DIAGNOSIS — E1151 Type 2 diabetes mellitus with diabetic peripheral angiopathy without gangrene: Secondary | ICD-10-CM | POA: Diagnosis present

## 2022-08-15 DIAGNOSIS — Z833 Family history of diabetes mellitus: Secondary | ICD-10-CM | POA: Diagnosis not present

## 2022-08-15 DIAGNOSIS — I252 Old myocardial infarction: Secondary | ICD-10-CM | POA: Diagnosis not present

## 2022-08-15 DIAGNOSIS — E1122 Type 2 diabetes mellitus with diabetic chronic kidney disease: Secondary | ICD-10-CM | POA: Diagnosis present

## 2022-08-15 DIAGNOSIS — I483 Typical atrial flutter: Secondary | ICD-10-CM | POA: Diagnosis present

## 2022-08-15 DIAGNOSIS — Z955 Presence of coronary angioplasty implant and graft: Secondary | ICD-10-CM | POA: Diagnosis not present

## 2022-08-15 DIAGNOSIS — I495 Sick sinus syndrome: Secondary | ICD-10-CM | POA: Diagnosis present

## 2022-08-15 DIAGNOSIS — E039 Hypothyroidism, unspecified: Secondary | ICD-10-CM | POA: Diagnosis present

## 2022-08-15 DIAGNOSIS — Z8249 Family history of ischemic heart disease and other diseases of the circulatory system: Secondary | ICD-10-CM | POA: Diagnosis not present

## 2022-08-15 DIAGNOSIS — K219 Gastro-esophageal reflux disease without esophagitis: Secondary | ICD-10-CM | POA: Diagnosis present

## 2022-08-15 DIAGNOSIS — Z8673 Personal history of transient ischemic attack (TIA), and cerebral infarction without residual deficits: Secondary | ICD-10-CM | POA: Diagnosis not present

## 2022-08-15 DIAGNOSIS — E7800 Pure hypercholesterolemia, unspecified: Secondary | ICD-10-CM | POA: Diagnosis present

## 2022-08-15 DIAGNOSIS — G4733 Obstructive sleep apnea (adult) (pediatric): Secondary | ICD-10-CM | POA: Diagnosis present

## 2022-08-15 DIAGNOSIS — Z7901 Long term (current) use of anticoagulants: Secondary | ICD-10-CM | POA: Diagnosis not present

## 2022-08-15 DIAGNOSIS — I130 Hypertensive heart and chronic kidney disease with heart failure and stage 1 through stage 4 chronic kidney disease, or unspecified chronic kidney disease: Secondary | ICD-10-CM | POA: Diagnosis present

## 2022-08-15 LAB — BASIC METABOLIC PANEL
Anion gap: 10 (ref 5–15)
BUN: 30 mg/dL — ABNORMAL HIGH (ref 8–23)
CO2: 20 mmol/L — ABNORMAL LOW (ref 22–32)
Calcium: 8.3 mg/dL — ABNORMAL LOW (ref 8.9–10.3)
Chloride: 105 mmol/L (ref 98–111)
Creatinine, Ser: 1.72 mg/dL — ABNORMAL HIGH (ref 0.44–1.00)
GFR, Estimated: 30 mL/min — ABNORMAL LOW (ref >60)
Glucose, Bld: 242 mg/dL — ABNORMAL HIGH (ref 70–99)
Potassium: 4.2 mmol/L (ref 3.5–5.1)
Sodium: 135 mmol/L (ref 135–145)

## 2022-08-15 LAB — GLUCOSE, CAPILLARY
Glucose-Capillary: 210 mg/dL — ABNORMAL HIGH (ref 70–99)
Glucose-Capillary: 263 mg/dL — ABNORMAL HIGH (ref 70–99)
Glucose-Capillary: 205 mg/dL — ABNORMAL HIGH (ref 70–99)
Glucose-Capillary: 212 mg/dL — ABNORMAL HIGH (ref 70–99)

## 2022-08-15 MED ORDER — ACETAMINOPHEN 325 MG PO TABS
650.0000 mg | ORAL_TABLET | ORAL | Status: DC | PRN
  Administered 2022-08-15: 650 mg via ORAL
  Filled 2022-08-15: qty 2

## 2022-08-15 NOTE — Progress Notes (Signed)
Pressure held to right groin for 15 minutes, per MD order. Clean dressing applied.

## 2022-08-15 NOTE — Progress Notes (Signed)
Rounding Note    Patient Name: Virginia Huber Date of Encounter: 08/15/2022  [facility] Cardiologist: Rozann Lesches, MD   Subjective   No chest pain or sob. "My right groin keeps bleeding."  Inpatient Medications    Scheduled Meds:  amiodarone  200 mg Oral Daily   apixaban  5 mg Oral BID   atorvastatin  80 mg Oral Daily   carvedilol  25 mg Oral BID   clopidogrel  75 mg Oral Daily   furosemide  20 mg Intravenous Q12H   insulin aspart  0-9 Units Subcutaneous TID WC   levothyroxine  112 mcg Oral Q0600   rOPINIRole  0.5 mg Oral QHS   sodium chloride flush  3 mL Intravenous Q12H   Continuous Infusions:  sodium chloride     PRN Meds: sodium chloride, acetaminophen, nitroGLYCERIN, ondansetron (ZOFRAN) IV, sodium chloride flush   Vital Signs    Vitals:   08/15/22 0636 08/15/22 0822 08/15/22 0935 08/15/22 1117  BP:  (!) 151/67 (!) 142/63 (!) 146/69  Pulse:  65 68 63  Resp:  '20 20 17  '$ Temp:  97.9 F (36.6 C) 97.6 F (36.4 C) 98.3 F (36.8 C)  TempSrc:  Oral  Oral  SpO2:  93% 94% 92%  Weight: 68.2 kg     Height:        Intake/Output Summary (Last 24 hours) at 08/15/2022 1221 Last data filed at 08/15/2022 0600 Gross per 24 hour  Intake 480 ml  Output 1250 ml  Net -770 ml      08/15/2022    6:36 AM 08/14/2022    5:50 AM 07/15/2022    9:44 AM  Last 3 Weights  Weight (lbs) 150 lb 5.7 oz 137 lb 136 lb 6.4 oz  Weight (kg) 68.2 kg 62.143 kg 61.871 kg      Telemetry    nsr - Personally Reviewed  ECG    none - Personally Reviewed  Physical Exam   GEN: No acute distress.   Neck: No JVD Cardiac: RRR, no murmurs, rubs, or gallops.  Respiratory: Clear to auscultation bilaterally. GI: Soft, nontender, non-distended  MS: No edema; No deformity. Neuro:  Nonfocal  Psych: Normal affect   Labs    High Sensitivity Troponin:  No results for input(s): "TROPONINIHS" in the last 720 hours.   Chemistry Recent Labs  Lab 08/14/22 0658 08/15/22 0127   NA 138 135  K 4.6 4.2  CL 107 105  CO2 24 20*  GLUCOSE 136* 242*  BUN 25* 30*  CREATININE 1.63* 1.72*  CALCIUM 9.1 8.3*  GFRNONAA 32* 30*  ANIONGAP 7 10    Lipids No results for input(s): "CHOL", "TRIG", "HDL", "LABVLDL", "LDLCALC", "CHOLHDL" in the last 168 hours.  HematologyNo results for input(s): "WBC", "RBC", "HGB", "HCT", "MCV", "MCH", "MCHC", "RDW", "PLT" in the last 168 hours. Thyroid No results for input(s): "TSH", "FREET4" in the last 168 hours.  BNPNo results for input(s): "BNP", "PROBNP" in the last 168 hours.  DDimer No results for input(s): "DDIMER" in the last 168 hours.   Radiology    DG CHEST PORT 1 VIEW  Result Date: 08/14/2022 CLINICAL DATA:  Shortness of breath EXAM: PORTABLE CHEST 1 VIEW COMPARISON:  05/09/2022 FINDINGS: Left subclavian 2 lead pacer noted. Cardiomegaly with vascular congestion and mid and lower lung interstitial opacities favored to represent edema. Small effusions suspected, larger on the left. There is associated basilar atelectasis and left lower lobe collapse/consolidation. No pneumothorax. Trachea midline. Degenerative changes of  the spine. Bones are osteopenic. Aorta atherosclerotic. IMPRESSION: 1. Cardiomegaly with interstitial edema pattern and small effusions, CHF is favored. 2. Left lower lobe collapse/consolidation. Electronically Signed   By: Jerilynn Mages.  Shick M.D.   On: 08/14/2022 16:30   EP STUDY  Result Date: 08/14/2022 SURGEON:  Allegra Lai, MD PREPROCEDURE DIAGNOSES: 1. Paroxysmal atrial fibrillation. 2. Typical appearing atrial flutter POSTPROCEDURE DIAGNOSES: 1. Paroxysmal  atrial fibrillation. 2. Typical appearing atrial flutter PROCEDURES: 1. Comprehensive electrophysiologic study. 2. Coronary sinus pacing and recording. 3. Three-dimensional mapping of atrial fibrillation with additional mapping and ablation of a second discrete focus (atrial flutter) 4. Ablation of atrial fibrillation with additional mapping and ablation of a second  discrete focus (atrial flutter) 5. Intracardiac echocardiography. 6. Transseptal puncture of an intact septum. 7. Arrhythmia induction with pacing with isuprel infusion INTRODUCTION:  SEIRA DLUGOSZ is a 77 y.o. female with a history of paroxysmal atrial fibrillation and typical appearing atrial flutter who now presents for EP study and radiofrequency ablation.  The patient reports initially being diagnosed with atrial fibrillation after presenting with symptomatic palpitations and fatgiue. The patient reports increasing frequency and duration of atrial arrhythmias since that time.  The patient has failed medical therapy with amiodarone.  The patient therefore presents today for catheter ablation of atrial fibrillation and atrial flutter. DESCRIPTION OF PROCEDURE:  Informed written consent was obtained, and the patient was brought to the electrophysiology lab in a fasting state.  The patient was adequately sedated with intravenous medications as outlined in the anesthesia report.  The patient's left and right groins were prepped and draped in the usual sterile fashion by the EP lab staff.  Using a percutaneous Seldinger technique, two 8-French hemostasis sheaths were placed in the right femoral vein, and one 7 [place] and one 11-French hemostasis sheaths were placed into the left common femoral vein. An esophageal temperature probe was inserted to monitor for heating of the esophagus during the procedure.  Each sheath site was preclosed using an Abbott Perclose and closed at the end of the case. Direct ultrasound guidance is used for right and left femoral veins with normal vessel patency. Ultrasound images are captured and stored in the patient's chart. Using ultrasound guidance, the Brockenbrough needle and wire were visualized entering the vessel. Catheter Placement:  A 7-French Biosense Webster Decapolar coronary sinus catheter was introduced through the right common femoral vein and advanced into the coronary  sinus for recording and pacing from this location.  A luminal esophageal temperature probe was placed and used for continuous monitoring of the luminal esophageal temperature throughout the procedure as well as to localize the esophagus on fluoroscopy. In addition, the esophagus was directly visualized with intracardiac echo and its positioned marked on Carto.  During ablation at the posterior wall there was limited esophageal heating noted during RF energy delivery with the maximal temperature recorded by the luminal temperature probe of < 38.5 degrees C. Initial Measurements: The patient presented to the electrophysiology lab in atrial flutter. her  QRS measured 153 msec and a QT interval of 440 msec.  The the HV interval measured 51 msec.   Intracardiac Echocardiography: An 8-French Biosense Webster AcuNav intracardiac echocardiography catheter was introduced through the right common femoral vein and advanced into the right atrium. Intracardiac echocardiography was performed of the left atrium, and a three-dimensional anatomical rendering of the left atrium was performed using CARTO sound technology.  The patient was noted to have a moderate sized left atrium.  The interatrial septum was prominent  but not aneurysmal. All 4 pulmonary veins were visualized and noted to have separate ostia.  The pulmonary veins were moderate in size.  The left atrial appendage was visualized and did not reveal thrombus.   There was no evidence of pulmonary vein stenosis. Transseptal Puncture: The right common femoral vein sheaths were exchanged for one 8.5 [employer] and one Bayless sheath and transseptal access was achieved with the Bayless in a standard fashion using a Bayless needle under fluoroscopy with intracardiac echocardiography confirmation of the transseptal puncture.  Once transseptal access had been achieved, heparin was administered intravenously and intra- arterially in order to maintain an ACT of  greater than 350 seconds throughout the procedure. 3D Mapping and Ablation: A 3.5 mm Biosense Webster SmartTouch Thermocool ablation catheter was advanced into the right atrium through the Visigo sheath.  The transseptal sheath was pulled back into the IVC over a guidewire.  The ablation catheter was advanced across the transseptal hole using the wire as a guide.  The transseptal sheath was then re-advanced over the guidewire into the left atrium.  A Biosense Webster octarray mapping catheter was introduced through the transseptal sheath and positioned over the mouth of all 4 pulmonary veins.  Three-dimensional electroanatomical mapping was performed using CARTO technology.  This demonstrated electrical activity within the left pulmonary veins at baseline. The patient underwent successful sequential electrical isolation and anatomical encircling of the left pulmonary veins using radiofrequency current with a circular mapping catheter as a guide.  A WACA approach was used.  Entrance and exit block were achieved. The ablation catheter was then pulled back into the right atrial and positioned along the cavo-tricuspid isthmus.  Mapping along the atrial side of the isthmus was performed.  This demonstrated a standard isthmus.  A series of radiofrequency applications were then delivered along the isthmus.  Complete bidirectional cavotricuspid isthmus block was achieved as confirmed by differential atrial pacing from the low lateral right atrium.  A stimulus to earliest atrial activation across the isthmus measured 170 msec bi-directionally.  The patient was observe without return of conduction through the isthmus. Measurements Following Ablation: Following ablation, dobutamine was infused up to 20 mcg/min with no inducible atrial fibrillation, atrial tachycardia, atrial flutter, or sustained PACs. In atrial paced rhythm with RR interval was 1203 msec, QRS 144 msec.  Following ablation the HV interval of 48 msec. No  further pacing was done due to the pacemaker. Electroisolation was then again confirmed in all four pulmonary veins. Intracardiac echocardiography was again performed, which revealed no pericardial effusion.  The procedure was therefore considered completed.  All catheters were removed, and the sheaths were aspirated and flushed.  The patient was transferred to the recovery area for sheath removal per protocol.  Intracardiac echocardiogram revealed no pericardial effusion. EBL<67m.  There were no early apparent complications. CONCLUSIONS: 1. Atrial flutter upon presentation.  2. Successful electrical isolation and anatomical encircling of all four pulmonary veins with radiofrequency current.   3. Cavo-tricuspid isthmus ablation was performed with complete bidirectional isthmus block achieved. 4. No inducible arrhythmias following ablation both on and off of dobutamine 5. No early apparent complications. Will Camnitz,MD 9:44 AM 08/14/2022   ECHO TEE  Result Date: 08/14/2022    TRANSESOPHOGEAL ECHO REPORT   Patient Name:   MSHANNICE WADLINGTONDate of Exam: 08/14/2022 Medical Rec #:  [id number]      Height:       63.0 in Accession #:    [id number]  Weight:       137.0 lb Date of Birth:  12/24/1945      BSA:          1.646 m Patient Age:    17 years        BP:           165/91 mmHg Patient Gender: F               HR:           92 bpm. Exam Location:  Inpatient Procedure: Transesophageal Echo, Color Doppler and Cardiac Doppler Indications:     I48.91* Unspecified atrial fibrillation  History:         Patient has prior history of Echocardiogram examinations, most                  recent 05/13/2022. CHF, CAD, Arrythmias:Atrial Fibrillation;                  Risk Factors:Hypertension, Diabetes, Dyslipidemia and Sleep                  Apnea.  Sonographer:     Raquel Sarna Senior RDCS Referring Phys:  [id number] WILL MARTIN CAMNITZ Diagnosing Phys: Skeet Latch MD  Sonographer Comments: LAA check before ablation PROCEDURE:  After discussion of the risks and benefits of a TEE, an informed consent was obtained from the patient. The transesophogeal probe was passed without difficulty through the esophogus of the patient. Sedation performed by different physician. The patient was monitored while under deep sedation. The patient's vital signs; including heart rate, blood pressure, and oxygen saturation; remained stable throughout the procedure. The patient developed no complications during the procedure.  IMPRESSIONS  1. Left ventricular ejection fraction, by estimation, is 30 to 35%. The left ventricle has moderately decreased function. The left ventricle demonstrates global hypokinesis.  2. Right ventricular systolic function is mildly reduced. The right ventricular size is normal.  3. Left atrial size was mildly dilated. No left atrial/left atrial appendage thrombus was detected.  4. The mitral valve is normal in structure. Mild mitral valve regurgitation. No evidence of mitral stenosis.  5. The aortic valve is tricuspid. Aortic valve regurgitation is not visualized. No aortic stenosis is present.  6. The inferior vena cava is normal in size with greater than 50% respiratory variability, suggesting right atrial pressure of 3 mmHg. FINDINGS  Left Ventricle: Left ventricular ejection fraction, by estimation, is 30 to 35%. The left ventricle has moderately decreased function. The left ventricle demonstrates global hypokinesis. The left ventricular internal cavity size was normal in size. There is no left ventricular hypertrophy. Right Ventricle: The right ventricular size is normal. No increase in right ventricular wall thickness. Right ventricular systolic function is mildly reduced. Left Atrium: Left atrial size was mildly dilated. No left atrial/left atrial appendage thrombus was detected. Right Atrium: Right atrial size was normal in size. Pericardium: There is no evidence of pericardial effusion. Mitral Valve: The mitral valve is  normal in structure. Mild mitral valve regurgitation. No evidence of mitral valve stenosis. Tricuspid Valve: The tricuspid valve is normal in structure. Tricuspid valve regurgitation is trivial. No evidence of tricuspid stenosis. Aortic Valve: The aortic valve is tricuspid. Aortic valve regurgitation is not visualized. No aortic stenosis is present. Pulmonic Valve: The pulmonic valve was normal in structure. Pulmonic valve regurgitation is not visualized. No evidence of pulmonic stenosis. Aorta: The aortic root is normal in size and structure. Venous: The inferior vena cava is normal in  size with greater than 50% respiratory variability, suggesting right atrial pressure of 3 mmHg. IAS/Shunts: No atrial level shunt detected by color flow Doppler. Additional Comments: A device lead is visualized. There is a small pleural effusion. Spectral Doppler performed. Skeet Latch MD Electronically signed by Skeet Latch MD Signature Date/Time: 08/14/2022/9:55:10 AM    Final     Cardiac Studies   See above  Patient Profile     77 y.o. female admitted after she began to bleed after PVI  Assessment & Plan    Post op bleeding - she is still oozing and I have asked her nurse to hold pressure. We will hold off on her discharge. If it continues then we will perform an ultrasound. Atrial fib - she is s/p PVI and is in NSR on amiodarone.  For questions or updates, please contact [place] Please consult [email or web address] for contact info under        Signed, Cristopher Peru, MD  08/15/2022, 12:21 PM

## 2022-08-15 NOTE — Progress Notes (Signed)
Right groin dressing saturated with blood again. Oozing appears to have slowed down however. MD paged, no new orders. Dressing changed.

## 2022-08-15 NOTE — Progress Notes (Signed)
Pt's right groin Inc has continued a slow ooze, Spoke to DR. Rudi Rummage new orders received. Asked pt about chert listed allergy to percocet pt states she has never had any trouble with just tylenol

## 2022-08-15 NOTE — Progress Notes (Signed)
Right groin with slow steady ooze per DR. Fernandes dressing reinforced bottom layer appears to have the beginning of a clot forming. Reinforced with 4 x 4's and hypo-fix and pressure held for 15 minutes. Groin is soft no evidence of hematoma.

## 2022-08-16 ENCOUNTER — Other Ambulatory Visit: Payer: Self-pay | Admitting: Home Health

## 2022-08-16 DIAGNOSIS — Z5189 Encounter for other specified aftercare: Secondary | ICD-10-CM

## 2022-08-16 DIAGNOSIS — L7682 Other postprocedural complications of skin and subcutaneous tissue: Secondary | ICD-10-CM | POA: Diagnosis not present

## 2022-08-16 LAB — GLUCOSE, CAPILLARY
Glucose-Capillary: 148 mg/dL — ABNORMAL HIGH (ref 70–99)
Glucose-Capillary: 235 mg/dL — ABNORMAL HIGH (ref 70–99)

## 2022-08-16 LAB — BASIC METABOLIC PANEL
Anion gap: 9 (ref 5–15)
BUN: 29 mg/dL — ABNORMAL HIGH (ref 8–23)
CO2: 27 mmol/L (ref 22–32)
Calcium: 8.6 mg/dL — ABNORMAL LOW (ref 8.9–10.3)
Chloride: 104 mmol/L (ref 98–111)
Creatinine, Ser: 1.89 mg/dL — ABNORMAL HIGH (ref 0.44–1.00)
GFR, Estimated: 27 mL/min — ABNORMAL LOW (ref >60)
Glucose, Bld: 224 mg/dL — ABNORMAL HIGH (ref 70–99)
Potassium: 4 mmol/L (ref 3.5–5.1)
Sodium: 140 mmol/L (ref 135–145)

## 2022-08-16 NOTE — Progress Notes (Signed)
Mobility Specialist Progress Note:   08/16/22 1150  Mobility  Activity Ambulated with assistance in hallway  Level of Assistance Contact guard assist, steadying assist  Assistive Device None  Distance Ambulated (ft) 350 ft  Activity Response Tolerated well  Mobility Referral Yes  $Mobility charge 1 Mobility   During Mobility: SpO2 90% HR 60s Post Mobility: HR 61  Pt eager for mobility session. Required steadying assist throughout ambulation, which is baseline per pt and husband. Pt back in bed with all needs met.   Nelta Numbers Mobility Specialist Please contact via SecureChat or  Rehab office at [phone]

## 2022-08-16 NOTE — Discharge Summary (Cosign Needed)
Discharge Summary    Patient ID: Virginia Huber MRN: [id number]; DOB: 1945-07-05  Admit date: 08/14/2022 Discharge date: 08/16/2022  PCP:  Sharilyn Sites, [place] [facility] Cardiologist:  Rozann Lesches, MD  Electrophysiologist:  Constance Haw, MD  {    Discharge Diagnoses    Principal Problem:   Atrial fibrillation [facility]) Active Problems:   Bleeding at insertion site    Diagnostic Studies/Procedures    A fib ablation 08/14/22:  SURGEON:  Allegra Lai, MD   PREPROCEDURE DIAGNOSES: 1. Paroxysmal atrial fibrillation. 2. Typical appearing atrial flutter   POSTPROCEDURE DIAGNOSES: 1. Paroxysmal  atrial fibrillation. 2. Typical appearing atrial flutter   PROCEDURES: 1. Comprehensive electrophysiologic study. 2. Coronary sinus pacing and recording. 3. Three-dimensional mapping of atrial fibrillation with additional mapping and ablation of a second discrete focus (atrial flutter) 4. Ablation of atrial fibrillation with additional mapping and ablation of a second discrete focus (atrial flutter) 5. Intracardiac echocardiography. 6. Transseptal puncture of an intact septum. 7. Arrhythmia induction with pacing with isuprel infusion   INTRODUCTION:  Virginia Huber is a 77 y.o. female with a history of paroxysmal atrial fibrillation and typical appearing atrial flutter who now presents for EP study and radiofrequency ablation.  The patient reports initially being diagnosed with atrial fibrillation after presenting with symptomatic palpitations and fatgiue. The patient reports increasing frequency and duration of atrial arrhythmias since that time.  The patient has failed medical therapy with amiodarone.  The patient therefore presents today for catheter ablation of atrial fibrillation and atrial flutter.   DESCRIPTION OF PROCEDURE:  Informed written consent was obtained, and the patient was brought to the electrophysiology lab in a fasting state.  The  patient was adequately sedated with intravenous medications as outlined in the anesthesia report.  The patient's left and right groins were prepped and draped in the usual sterile fashion by the EP lab staff.  Using a percutaneous Seldinger technique, two 8-French hemostasis sheaths were placed in the right femoral vein, and one 7 [place] and one 11-French hemostasis sheaths were placed into the left common femoral vein. An esophageal temperature probe was inserted to monitor for heating of the esophagus during the procedure.  Each sheath site was preclosed using an Abbott Perclose and closed at the end of the case.   Direct ultrasound guidance is used for right and left femoral veins with normal vessel patency. Ultrasound images are captured and stored in the patient's chart. Using ultrasound guidance, the Brockenbrough needle and wire were visualized entering the vessel.   Catheter Placement:  A 7-French Biosense Webster Decapolar coronary sinus catheter was introduced through the right common femoral vein and advanced into the coronary sinus for recording and pacing from this location.     A luminal esophageal temperature probe was placed and used for continuous monitoring of the luminal esophageal temperature throughout the procedure as well as to localize the esophagus on fluoroscopy. In addition, the esophagus was directly visualized with intracardiac echo and its positioned marked on Carto.  During ablation at the posterior wall there was limited esophageal heating noted during RF energy delivery with the maximal temperature recorded by the luminal temperature probe of < 38.5 degrees C.   Initial Measurements: The patient presented to the electrophysiology lab in atrial flutter. her  QRS measured 153 msec and a QT interval of 440 msec.  The the HV interval measured 51 msec.      Intracardiac Echocardiography: An 8-French Biosense Webster AcuNav  intracardiac echocardiography catheter was  introduced through the right common femoral vein and advanced into the right atrium. Intracardiac echocardiography was performed of the left atrium, and a three-dimensional anatomical rendering of the left atrium was performed using CARTO sound technology.  The patient was noted to have a moderate sized left atrium.  The interatrial septum was prominent but not aneurysmal. All 4 pulmonary veins were visualized and noted to have separate ostia.  The pulmonary veins were moderate in size.  The left atrial appendage was visualized and did not reveal thrombus.   There was no evidence of pulmonary vein stenosis.    Transseptal Puncture: The right common femoral vein sheaths were exchanged for one 8.5 [employer] and one Bayless sheath and transseptal access was achieved with the Bayless in a standard fashion using a Bayless needle under fluoroscopy with intracardiac echocardiography confirmation of the transseptal puncture.  Once transseptal access had been achieved, heparin was administered intravenously and intra- arterially in order to maintain an ACT of greater than 350 seconds throughout the procedure.    3D Mapping and Ablation: A 3.5 mm Biosense Webster SmartTouch Thermocool ablation catheter was advanced into the right atrium through the Visigo sheath.  The transseptal sheath was pulled back into the IVC over a guidewire.  The ablation catheter was advanced across the transseptal hole using the wire as a guide.  The transseptal sheath was then re-advanced over the guidewire into the left atrium.  A Biosense Webster octarray mapping catheter was introduced through the transseptal sheath and positioned over the mouth of all 4 pulmonary veins.  Three-dimensional electroanatomical mapping was performed using CARTO technology.  This demonstrated electrical activity within the left pulmonary veins at baseline. The patient underwent successful sequential electrical isolation and anatomical  encircling of the left pulmonary veins using radiofrequency current with a circular mapping catheter as a guide.  A WACA approach was used.  Entrance and exit block were achieved.  The ablation catheter was then pulled back into the right atrial and positioned along the cavo-tricuspid isthmus.  Mapping along the atrial side of the isthmus was performed.  This demonstrated a standard isthmus.  A series of radiofrequency applications were then delivered along the isthmus.  Complete bidirectional cavotricuspid isthmus block was achieved as confirmed by differential atrial pacing from the low lateral right atrium.  A stimulus to earliest atrial activation across the isthmus measured 170 msec bi-directionally.  The patient was observe without return of conduction through the isthmus.   Measurements Following Ablation: Following ablation, dobutamine was infused up to 20 mcg/min with no inducible atrial fibrillation, atrial tachycardia, atrial flutter, or sustained PACs. In atrial paced rhythm with RR interval was 1203 msec, QRS 144 msec.  Following ablation the HV interval of 48 msec. No further pacing was done due to the pacemaker. Electroisolation was then again confirmed in all four pulmonary veins.   Intracardiac echocardiography was again performed, which revealed no pericardial effusion.  The procedure was therefore considered completed.  All catheters were removed, and the sheaths were aspirated and flushed.  The patient was transferred to the recovery area for sheath removal per protocol.  Intracardiac echocardiogram revealed no pericardial effusion. EBL<38m.  There were no early apparent complications.   CONCLUSIONS: 1. Atrial flutter upon presentation.   2. Successful electrical isolation and anatomical encircling of all four pulmonary veins with radiofrequency current.    3. Cavo-tricuspid isthmus ablation was performed with complete bidirectional isthmus block achieved.  4. No inducible  arrhythmias following ablation both on and off of dobutamine 5. No early apparent complications.    TEE 08/14/22:  1. Left ventricular ejection fraction, by estimation, is 30 to 35%. The  left ventricle has moderately decreased function. The left ventricle  demonstrates global hypokinesis.   2. Right ventricular systolic function is mildly reduced. The right  ventricular size is normal.   3. Left atrial size was mildly dilated. No left atrial/left atrial  appendage thrombus was detected.   4. The mitral valve is normal in structure. Mild mitral valve  regurgitation. No evidence of mitral stenosis.   5. The aortic valve is tricuspid. Aortic valve regurgitation is not  visualized. No aortic stenosis is present.   6. The inferior vena cava is normal in size with greater than 50%  respiratory variability, suggesting right atrial pressure of 3 mmHg.  _____________   History of Present Illness     Per admission H&P on 08/14/22:   Virginia Huber is a 77 y.o. female who presented to [facility] on 08/14/22 for electrophysiology evaluation.      She has a history significant for coronary artery disease s/p  PCI to the OM1 with STEMI in 2016, PCI to the circumflex with non-STEMI 2017, PCI to the RCA.  She has atrial fibrillation on admission.  Currently on Eliquis.  She has mild sleep apnea.  She is s/p Medtronic dual-chamber pacemaker implanted 06/25/2016.   She had a recent hospitalization for rapid atrial fibrillation.  She was placed on amiodarone.  She required TEE and cardioversion.  She was scheduled for ablation for atrial fibrillation February 2024.   08/14/22, she denied symptoms of palpitations, chest pain, shortness of breath, orthopnea, PND, lower extremity edema, claudication, dizziness, presyncope, syncope, bleeding, or neurologic sequela. The patient was tolerating medications without difficulties.   She proceeded with ablation with Dr Curt Bears, post procedure course was complicated by groin  bleeding, and she was admitted for observation.   Hospital Course     Consultants: N/A    Persistent A fib/flutter  Post A fib ablation complicated with right groin bleeding  - presented for outpatient Afib ablation and EP study 08/14/22, post procedure complicated by right groin bleeding and increased SOB at rest consistent with CHF exacerbation, admitted for observation subsequently  - Cavo-tricuspid isthmus ablation was successful without inducible arrhythmias following - Had ongoing intermittent oozing from right groin for 24 hours, which has resolved now, this was felt superficial, will hold Eliquis for another 48 hours and resume on 08/18/22 per Dr Lovena Le  - continue PTA amiodarone '200mg'$  daily and Coreg '25mg'$  BID  - Follow up was arranged with [facility] office on 08/31/22 and A fib clinic arranged on 0000000     Acute systolic heart failure on chronic HFpEF - reported increased SOB for 4 days and worsened after ablation 08/14/22 - Post procedure CXR consistent with CHF - TEE pre-ablation showed LVEF 30-35% (down rom 50-55% on TEE 05/12/22), global HPK, mild reduced RV, mild LAE,no LA/LAA thrombus, mild MR - LHC from 05/11/22 showed mild to moderate LAD disease, patent Cx stent, small caliber first OM branch is chronically occluded, patent PLA stent, small caliber distal PLA beyond the stented segment has moderately severe disease at a bifurcation but the vessels appear too small for PCI. - Likely tachycardia mediated   - s/p IV Lasix, I&O not accurate, weight is 149ibs today, Cr rising from 1.43 to 1.72 over the past 2 days, BMP pending today (if renal index stable ok  for DC), will stop IV Lasix as she is euvolemic now, she was not on diuretic PTA, given resolved  A fib, can remain off diuretic for now  - GDMT: on PTA coreg '25mg'$  BID and Olmesartan '5mg'$  daily, given worsening renal index, will continue hold olmesartan (was held since 07/06/22 office visit for orthostatic hypotension), continue coreg,  will not add SGLT2I/MRA for now, this can considered if renal function recovers to baseline  - will need repeat BMP on Tuesday 08/18/22 at [facility] office, BMP ordered, message sent for the [facility] office staff to arrange appointment  - Follow up was arranged on 08/31/22 with [facility] office   CKD IIIb - Cr baseline appears 1.3-1.5, slowly rising from 1.43 to 1.72 over the past 48 hours with IV diuresis - euvolemic now, IV lasix stopped - BMP repeat planned on 08/18/22 at [facility] office  - avoid nephrotoxin   CAD with hx of stent Cx and RCA  - LHC from 04/2022 as above, medical management has been recommended - no chest pain currently - continue PTA coreg, plavix, lipitor   SSS - 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 , follow up with EP as scheduled   Type 2 DM  - resume PTA Semaglutide and  Novolin AC, no change   Hypothyroidism - continue PTA levothyroxine, no change     Did the patient have an acute coronary syndrome (MI, NSTEMI, STEMI, etc) this admission?:  No                               Did the patient have a percutaneous coronary intervention (stent / angioplasty)?:  No.        The patient will be scheduled for a TOC follow up appointment in 14 days.  Discharge Vitals Blood pressure 129/61, pulse (!) 57, temperature (!) 97.4 F (36.3 C), temperature source Oral, resp. rate 18, height '5\' 3"'$  (1.6 m), weight 67.9 kg, SpO2 93 %.  Filed Weights   08/14/22 0550 08/15/22 0636 08/16/22 0245  Weight: 62.1 kg 68.2 kg 67.9 kg    Labs & Radiologic Studies    CBC No results for input(s): "WBC", "NEUTROABS", "HGB", "HCT", "MCV", "PLT" in the last 72 hours. Basic Metabolic Panel Recent Labs    08/14/22 0658 08/15/22 0127  NA 138 135  K 4.6 4.2  CL 107 105  CO2 24 20*  GLUCOSE 136* 242*  BUN 25* 30*  CREATININE 1.63* 1.72*  CALCIUM 9.1 8.3*   Liver Function Tests No results for input(s): "AST", "ALT", "ALKPHOS", "BILITOT", "PROT", "ALBUMIN" in the last 72 hours. No results for input(s): "LIPASE",  "AMYLASE" in the last 72 hours. High Sensitivity Troponin:   No results for input(s): "TROPONINIHS" in the last 720 hours.  BNP Invalid input(s): "POCBNP" D-Dimer No results for input(s): "DDIMER" in the last 72 hours. Hemoglobin A1C No results for input(s): "HGBA1C" in the last 72 hours. Fasting Lipid Panel No results for input(s): "CHOL", "HDL", "LDLCALC", "TRIG", "CHOLHDL", "LDLDIRECT" in the last 72 hours. Thyroid Function Tests No results for input(s): "TSH", "T4TOTAL", "T3FREE", "THYROIDAB" in the last 72 hours.  Invalid input(s): "FREET3" _____________  DG CHEST PORT 1 VIEW  Result Date: 08/14/2022 CLINICAL DATA:  Shortness of breath EXAM: PORTABLE CHEST 1 VIEW COMPARISON:  05/09/2022 FINDINGS: Left subclavian 2 lead pacer noted. Cardiomegaly with vascular congestion and mid and lower lung interstitial opacities favored to represent edema. Small effusions suspected, larger on the left. There is associated basilar  atelectasis and left lower lobe collapse/consolidation. No pneumothorax. Trachea midline. Degenerative changes of the spine. Bones are osteopenic. Aorta atherosclerotic. IMPRESSION: 1. Cardiomegaly with interstitial edema pattern and small effusions, CHF is favored. 2. Left lower lobe collapse/consolidation. Electronically Signed   By: Jerilynn Mages.  Shick M.D.   On: 08/14/2022 16:30   EP STUDY  Result Date: 08/14/2022 SURGEON:  Allegra Lai, MD PREPROCEDURE DIAGNOSES: 1. Paroxysmal atrial fibrillation. 2. Typical appearing atrial flutter POSTPROCEDURE DIAGNOSES: 1. Paroxysmal  atrial fibrillation. 2. Typical appearing atrial flutter PROCEDURES: 1. Comprehensive electrophysiologic study. 2. Coronary sinus pacing and recording. 3. Three-dimensional mapping of atrial fibrillation with additional mapping and ablation of a second discrete focus (atrial flutter) 4. Ablation of atrial fibrillation with additional mapping and ablation of a second discrete focus (atrial flutter) 5. Intracardiac  echocardiography. 6. Transseptal puncture of an intact septum. 7. Arrhythmia induction with pacing with isuprel infusion INTRODUCTION:  Virginia Huber is a 77 y.o. female with a history of paroxysmal atrial fibrillation and typical appearing atrial flutter who now presents for EP study and radiofrequency ablation.  The patient reports initially being diagnosed with atrial fibrillation after presenting with symptomatic palpitations and fatgiue. The patient reports increasing frequency and duration of atrial arrhythmias since that time.  The patient has failed medical therapy with amiodarone.  The patient therefore presents today for catheter ablation of atrial fibrillation and atrial flutter. DESCRIPTION OF PROCEDURE:  Informed written consent was obtained, and the patient was brought to the electrophysiology lab in a fasting state.  The patient was adequately sedated with intravenous medications as outlined in the anesthesia report.  The patient's left and right groins were prepped and draped in the usual sterile fashion by the EP lab staff.  Using a percutaneous Seldinger technique, two 8-French hemostasis sheaths were placed in the right femoral vein, and one 7 [place] and one 11-French hemostasis sheaths were placed into the left common femoral vein. An esophageal temperature probe was inserted to monitor for heating of the esophagus during the procedure.  Each sheath site was preclosed using an Abbott Perclose and closed at the end of the case. Direct ultrasound guidance is used for right and left femoral veins with normal vessel patency. Ultrasound images are captured and stored in the patient's chart. Using ultrasound guidance, the Brockenbrough needle and wire were visualized entering the vessel. Catheter Placement:  A 7-French Biosense Webster Decapolar coronary sinus catheter was introduced through the right common femoral vein and advanced into the coronary sinus for recording and pacing from this  location.  A luminal esophageal temperature probe was placed and used for continuous monitoring of the luminal esophageal temperature throughout the procedure as well as to localize the esophagus on fluoroscopy. In addition, the esophagus was directly visualized with intracardiac echo and its positioned marked on Carto.  During ablation at the posterior wall there was limited esophageal heating noted during RF energy delivery with the maximal temperature recorded by the luminal temperature probe of < 38.5 degrees C. Initial Measurements: The patient presented to the electrophysiology lab in atrial flutter. her  QRS measured 153 msec and a QT interval of 440 msec.  The the HV interval measured 51 msec.   Intracardiac Echocardiography: An 8-French Biosense Webster AcuNav intracardiac echocardiography catheter was introduced through the right common femoral vein and advanced into the right atrium. Intracardiac echocardiography was performed of the left atrium, and a three-dimensional anatomical rendering of the left atrium was performed using CARTO sound technology.  The patient was noted  to have a moderate sized left atrium.  The interatrial septum was prominent but not aneurysmal. All 4 pulmonary veins were visualized and noted to have separate ostia.  The pulmonary veins were moderate in size.  The left atrial appendage was visualized and did not reveal thrombus.   There was no evidence of pulmonary vein stenosis. Transseptal Puncture: The right common femoral vein sheaths were exchanged for one 8.5 [employer] and one Bayless sheath and transseptal access was achieved with the Bayless in a standard fashion using a Bayless needle under fluoroscopy with intracardiac echocardiography confirmation of the transseptal puncture.  Once transseptal access had been achieved, heparin was administered intravenously and intra- arterially in order to maintain an ACT of greater than 350 seconds throughout the  procedure. 3D Mapping and Ablation: A 3.5 mm Biosense Webster SmartTouch Thermocool ablation catheter was advanced into the right atrium through the Visigo sheath.  The transseptal sheath was pulled back into the IVC over a guidewire.  The ablation catheter was advanced across the transseptal hole using the wire as a guide.  The transseptal sheath was then re-advanced over the guidewire into the left atrium.  A Biosense Webster octarray mapping catheter was introduced through the transseptal sheath and positioned over the mouth of all 4 pulmonary veins.  Three-dimensional electroanatomical mapping was performed using CARTO technology.  This demonstrated electrical activity within the left pulmonary veins at baseline. The patient underwent successful sequential electrical isolation and anatomical encircling of the left pulmonary veins using radiofrequency current with a circular mapping catheter as a guide.  A WACA approach was used.  Entrance and exit block were achieved. The ablation catheter was then pulled back into the right atrial and positioned along the cavo-tricuspid isthmus.  Mapping along the atrial side of the isthmus was performed.  This demonstrated a standard isthmus.  A series of radiofrequency applications were then delivered along the isthmus.  Complete bidirectional cavotricuspid isthmus block was achieved as confirmed by differential atrial pacing from the low lateral right atrium.  A stimulus to earliest atrial activation across the isthmus measured 170 msec bi-directionally.  The patient was observe without return of conduction through the isthmus. Measurements Following Ablation: Following ablation, dobutamine was infused up to 20 mcg/min with no inducible atrial fibrillation, atrial tachycardia, atrial flutter, or sustained PACs. In atrial paced rhythm with RR interval was 1203 msec, QRS 144 msec.  Following ablation the HV interval of 48 msec. No further pacing was done due to the pacemaker.  Electroisolation was then again confirmed in all four pulmonary veins. Intracardiac echocardiography was again performed, which revealed no pericardial effusion.  The procedure was therefore considered completed.  All catheters were removed, and the sheaths were aspirated and flushed.  The patient was transferred to the recovery area for sheath removal per protocol.  Intracardiac echocardiogram revealed no pericardial effusion. EBL<59m.  There were no early apparent complications. CONCLUSIONS: 1. Atrial flutter upon presentation.  2. Successful electrical isolation and anatomical encircling of all four pulmonary veins with radiofrequency current.   3. Cavo-tricuspid isthmus ablation was performed with complete bidirectional isthmus block achieved. 4. No inducible arrhythmias following ablation both on and off of dobutamine 5. No early apparent complications. Will Camnitz,MD 9:44 AM 08/14/2022   ECHO TEE  Result Date: 08/14/2022    TRANSESOPHOGEAL ECHO REPORT   Patient Name:   Virginia MCLELLANDate of Exam: 08/14/2022 Medical Rec #:  [id number]      Height:  63.0 in Accession #:    [id number]      Weight:       137.0 lb Date of Birth:  1946-05-14      BSA:          1.646 m Patient Age:    31 years        BP:           165/91 mmHg Patient Gender: F               HR:           92 bpm. Exam Location:  Inpatient Procedure: Transesophageal Echo, Color Doppler and Cardiac Doppler Indications:     I48.91* Unspecified atrial fibrillation  History:         Patient has prior history of Echocardiogram examinations, most                  recent 05/13/2022. CHF, CAD, Arrythmias:Atrial Fibrillation;                  Risk Factors:Hypertension, Diabetes, Dyslipidemia and Sleep                  Apnea.  Sonographer:     Raquel Sarna Senior RDCS Referring Phys:  [id number] WILL MARTIN CAMNITZ Diagnosing Phys: Skeet Latch MD  Sonographer Comments: LAA check before ablation PROCEDURE: After discussion of the risks and benefits of a  TEE, an informed consent was obtained from the patient. The transesophogeal probe was passed without difficulty through the esophogus of the patient. Sedation performed by different physician. The patient was monitored while under deep sedation. The patient's vital signs; including heart rate, blood pressure, and oxygen saturation; remained stable throughout the procedure. The patient developed no complications during the procedure.  IMPRESSIONS  1. Left ventricular ejection fraction, by estimation, is 30 to 35%. The left ventricle has moderately decreased function. The left ventricle demonstrates global hypokinesis.  2. Right ventricular systolic function is mildly reduced. The right ventricular size is normal.  3. Left atrial size was mildly dilated. No left atrial/left atrial appendage thrombus was detected.  4. The mitral valve is normal in structure. Mild mitral valve regurgitation. No evidence of mitral stenosis.  5. The aortic valve is tricuspid. Aortic valve regurgitation is not visualized. No aortic stenosis is present.  6. The inferior vena cava is normal in size with greater than 50% respiratory variability, suggesting right atrial pressure of 3 mmHg. FINDINGS  Left Ventricle: Left ventricular ejection fraction, by estimation, is 30 to 35%. The left ventricle has moderately decreased function. The left ventricle demonstrates global hypokinesis. The left ventricular internal cavity size was normal in size. There is no left ventricular hypertrophy. Right Ventricle: The right ventricular size is normal. No increase in right ventricular wall thickness. Right ventricular systolic function is mildly reduced. Left Atrium: Left atrial size was mildly dilated. No left atrial/left atrial appendage thrombus was detected. Right Atrium: Right atrial size was normal in size. Pericardium: There is no evidence of pericardial effusion. Mitral Valve: The mitral valve is normal in structure. Mild mitral valve regurgitation.  No evidence of mitral valve stenosis. Tricuspid Valve: The tricuspid valve is normal in structure. Tricuspid valve regurgitation is trivial. No evidence of tricuspid stenosis. Aortic Valve: The aortic valve is tricuspid. Aortic valve regurgitation is not visualized. No aortic stenosis is present. Pulmonic Valve: The pulmonic valve was normal in structure. Pulmonic valve regurgitation is not visualized. No evidence of pulmonic stenosis. Aorta: The aortic root is  normal in size and structure. Venous: The inferior vena cava is normal in size with greater than 50% respiratory variability, suggesting right atrial pressure of 3 mmHg. IAS/Shunts: No atrial level shunt detected by color flow Doppler. Additional Comments: A device lead is visualized. There is a small pleural effusion. Spectral Doppler performed. Skeet Latch MD Electronically signed by Skeet Latch MD Signature Date/Time: 08/14/2022/9:55:10 AM    Final    CT CARDIAC MORPH/PULM VEIN W/CM&W/O CA SCORE  Addendum Date: 08/11/2022   ADDENDUM REPORT: 08/11/2022 15:33 EXAM: OVER-READ INTERPRETATION  CT CHEST The following report is an over-read performed by radiologist Dr. West Bali Tirr [facility] on 08/11/2022. This over-read does not include interpretation of cardiac or coronary anatomy or pathology. The coronary CTA interpretation by the cardiologist is attached. COMPARISON:  None. FINDINGS: Aorta normal caliber with scattered atherosclerotic calcifications. Scattered coronary arterial calcifications. No pericardial effusion. Pulmonary arteries patent. Esophagus unremarkable. Borderline prominent precarinal lymph node 11 mm short axis image 2. BILATERAL pleural effusions with compressive atelectasis of adjacent lower lobes. Central peribronchial thickening. No infiltrate or pneumothorax. Visualized upper abdomen unremarkable. Osseous demineralization. IMPRESSION: BILATERAL pleural effusions with compressive atelectasis of adjacent lower  lobes. Central peribronchial thickening. Borderline prominent precarinal lymph node, nonspecific. Scattered coronary arterial calcifications. Aortic Atherosclerosis (ICD10-I70.0). Electronically Signed   By: Lavonia Dana M.D.   On: 08/11/2022 15:33   Result Date: 08/11/2022 CLINICAL DATA:  37F with CAD s/p PCI and atrial fibrillation scheduled for an ablation. EXAM: Cardiac CT/CTA TECHNIQUE: The patient was scanned on a Siemens Somatom scanner. FINDINGS: A 120 kV prospective scan was triggered in the descending thoracic aorta at 111 HU's. Gantry rotation speed was 280 msecs and collimation was .9 mm. No beta blockade and no NTG was given. The 3D data set was reconstructed in 5% intervals of the 60-80 % of the R-R cycle. Diastolic phases were analyzed on a dedicated work station using MPR, MIP and VRT modes. The patient received 80 cc of contrast. There is normal pulmonary vein drainage into the left atrium (2 on the right and 2 on the left) with ostial measurements as follows: RUPV: 24.2 x 21.1 mm RLPV: 12.8 x 11.0 mm LUPV: 23.3 x 21.5 mm LLPV: 17.9 x 15.5 mm The left atrial appendage is large with two lobes and ostial size 30.5 x 22.1 mm and length 45 mm. The left atrial appendage does not fill with contrast either initially or with delay. Can not rule out left atrial appendage thrombus. The esophagus runs in the left atrial midline and is not in the proximity to any of the pulmonary veins. Aorta: Normal caliber. Ascending aorta 2.9 cm. Aortic atherosclerosis. No dissection. Aortic Valve:  Trileaflet.  No calcifications. Coronary Arteries: Normal coronary origin. Right dominance. The study was performed without use of NTG and insufficient for plaque evaluation. Coronary calcification with coronary stents present. Device leads in the right atrium and right ventricle. Mild mitral annular calcification. IMPRESSION: 1. There is normal pulmonary vein drainage into the left atrium. 2. The left atrial appendage is large  with two lobes and ostial size 30.5 x 22.1 mm and length 45 mm. The left atrial appendage does not fill with contrast either initially or with delay. Can not rule out left atrial appendage thrombus. 3. The esophagus runs in the left atrial midline and is not in the proximity to any of the pulmonary veins. 4. Coronary calcification with coronary stents present. Skeet Latch, MD Electronically Signed: By: Skeet Latch M.D. On: 08/07/2022  15:26   Disposition   Patient was seen by Dr Lovena Le today and deemed stable for discharge. Discharge plan, medication change, follow up plan reviewed with the patient. Pt is being discharged home today in good condition.  Follow-up Plans & Appointments     Follow-up Information     Finis Bud, NP Follow up on 08/31/2022.   Specialty: Cardiology Why: at 1130am for your cardiology follow up appointment Contact information: [place] [address] [phone]                Discharge Instructions     Diet - low sodium heart healthy   Complete by: As directed    Discharge instructions   Complete by: As directed    Please anticipate a call from [facility] cardiology office for arranging blood work for you on 08/18/22  Please follow up on 08/31/22 as scheduled with Ms Arlington Calix      Groin Site Care  Refer to this sheet in the next few weeks. These instructions provide you with information on caring for yourself after your procedure. Your caregiver may also give you more specific instructions. Your treatment has been planned according to current medical practices, but problems sometimes occur. Call your caregiver if you have any problems or questions after your procedure.  HOME CARE INSTRUCTIONS You may shower 24 hours after the procedure. Remove the bandage (dressing) and gently wash the site with plain soap and water. Gently pat the site dry.  Do not apply powder or lotion to the site.  Do not sit in a bathtub, swimming pool,  or whirlpool for 5 to 7 days.  No bending, squatting, or lifting anything over 10 pounds (4.5 kg) as directed by your caregiver.  Inspect the site at least twice daily.  Do not drive home if you are discharged the same day of the procedure. Have someone else drive you.  You may drive 24 hours after the procedure unless otherwise instructed by your caregiver.   What to expect: Any bruising will usually fade within 1 to 2 weeks.  Blood that collects in the tissue (hematoma) may be painful to the touch. It should usually decrease in size and tenderness within 1 to 2 weeks.   SEEK IMMEDIATE MEDICAL CARE IF: You have unusual pain at the groin site or down the affected leg.  You have redness, warmth, swelling, or pain at the groin site.  You have drainage (other than a small amount of blood on the dressing).  You have chills.  You have a fever or persistent symptoms for more than 72 hours.  You have a fever and your symptoms suddenly get worse.  Your leg becomes pale, cool, tingly, or numb.  You have heavy bleeding from the site. Hold pressure on the site. [name]    HEART FAILURE INSTRUCTIONS   Follow a low salt diet which means you need to consume less than 2 grams ('2000mg'$ ) of sodium per day.  Check the labels!  You'll be surprised to see how much sodium is in common foods and drinks.  DO NOT EAT foods high in salt, such as salted nuts, crackers, smoked fish, fast food, deli meats, salami, pepperoni, jerky, prosciutto, ham, bacon, soups, prepared frozen dinners.  DO NOT add salt to your food when cooking or at the table.  It is ok to use other seasonings and flavors as long as they are salt free.    Drink less than 8 cups (which is ~  2075m or 2 liters) of fluid per day.  This is equivalent to ~ 4 bottles of water a day.  Weigh yourself every morning before breakfast and write it down in a log.  Bring in your record to every clinic visit.  Check for swelling in your feet, ankles, legs and stomach  every morning.  Take an extra dose of your fluid pill once a day for worsening shortness of breath, swelling, or >3lb weight gain in 24 hours or >5lb weight gain in 1 week.  Control your blood pressure.  If you have a blood pressure cuff, record your blood pressure & heart rate daily. [facility].  Goal BP is <140/90  Control your blood sugars if you are diabetic  Control your cholesterol.  The goal LDL (bad cholesterol) is <70  Lose weight if you are overweight  Exercise as much as you are able to strengthen your heart  Take your medicine the way you are instructed   [facility] ALL your medicines and [facility]   Get a yearly flu shot to reduce your risk of the flu   If you are under age 4892you need the Pneumovax pneumonia vaccine as a one time shot to reduce the risk of pneumonia   If you are 641yrs or older you need the Prevnar pneumonia vaccine as a one time shot followed by a Pneumovax pneumonia vaccine > 1 year later as a one time shot to help reduce your risk of pneumonia   Call nurse if you have trouble paying for your medications, are running low on pills, having trouble with transportation or if you are gaining weight quickly, having significant swelling, trouble breathing, chest pain, dizziness or fainting spells.          Increase activity slowly   Complete by: As directed         Discharge Medications   Allergies as of 08/16/2022       Reactions   Penicillins Hives   Has patient had a PCN reaction causing immediate rash, facial/tongue/throat swelling, SOB or lightheadedness with hypotension: Yes Has patient had a PCN reaction causing severe rash involving mucus membranes or skin necrosis: No Has patient had a PCN reaction that required hospitalization No Has patient had a PCN reaction occurring within the last 10 years: No If all of the above answers are "NO", then may proceed with Cephalosporin use.    Percocet [oxycodone-acetaminophen] Nausea And Vomiting        Medication List     STOP taking these medications    olmesartan 5 MG tablet Commonly known as: BENICAR       TAKE these medications    acetaminophen 325 MG tablet Commonly known as: TYLENOL Take 650 mg by mouth every 6 (six) hours as needed for headache.   amiodarone 200 MG tablet Commonly known as: PACERONE Take 1 tablet (200 mg total) by mouth daily.   atorvastatin 80 MG tablet Commonly known as: LIPITOR TAKE ONE TABLET BY MOUTH EVERY DAY AT 6:00PM What changed: See the new instructions.   calcitRIOL 0.25 MCG capsule Commonly known as: ROCALTROL Take 0.25 mcg by mouth every Monday, Wednesday, and Friday.   carvedilol 25 MG tablet Commonly known as: COREG Take 1 tablet (25 mg total) by mouth 2 (two) times daily.   clopidogrel 75 MG tablet Commonly known as: PLAVIX TAKE ONE TABLET ('75MG'$  TOTAL) BY MOUTH DAILY   clotrimazole-betamethasone cream  Commonly known as: LOTRISONE Apply 1 Application topically 2 (two) times daily.   Eliquis 5 MG Tabs tablet Generic drug: apixaban TAKE ONE TABLET BY MOUTH TWICE DAILY   FreeStyle Libre 2 Reader Devi 1 each by Does not apply route daily.   FreeStyle Libre 2 Sensor Misc USE ONE FOR FOURTEEN DAYS   FreeStyle Precision Neo Test test strip Generic drug: glucose blood Use as instructed   levothyroxine 112 MCG tablet Commonly known as: SYNTHROID TAKE ONE TABLET BY MOUTH ONCE DAILY   mupirocin ointment 2 % Commonly known as: BACTROBAN Apply 1 Application topically 3 (three) times daily.   nitroGLYCERIN 0.4 MG SL tablet Commonly known as: NITROSTAT Place 1 tablet (0.4 mg total) under the tongue every 5 (five) minutes x 3 doses as needed for chest pain (If not relief after 3rd dose, call 911 or go to ED).   NovoLIN N FlexPen 100 UNIT/ML Kiwkpen Generic drug: Insulin NPH (Human) (Isophane) INJECT 25 UNITS EVERY MORNING and INJECT 15 UNITS BEFORE  dinner What changed:  how much to take how to take this when to take this additional instructions   rOPINIRole 0.5 MG tablet Commonly known as: REQUIP Take 0.5 mg by mouth at bedtime.   Semaglutide (2 MG/DOSE) 8 MG/3ML Sopn Inject 2 mg as directed once a week.   VITAMIN D3 PO Take 2,000 Units by mouth every evening.           Outstanding Labs/Studies     Duration of Discharge Encounter   Greater than 30 minutes including physician time.  Signed, Margie Billet, NP 08/16/2022, 12:30 PM   EP Attending  Patient seen and examined. Agree with above. See my note as well. Her bleeding has finally stopped. She will be discharged home and resume her blood thinner on 2/27. Continue other meds.   Carleene Overlie Amahri Dengel,MD

## 2022-08-16 NOTE — Progress Notes (Addendum)
[facility] notified of dc order. Iv dcd , site unremarkable.Per Dr. Curt Bears, patient to re start Eliquis tomorrow night. And No lasix order for now. Patient and husband made aware.

## 2022-08-16 NOTE — Progress Notes (Signed)
Rounding Note    Patient Name: KORINA MACIAS Date of Encounter: 08/16/2022  [place] Cardiologist: Rozann Lesches, MD   Subjective   Still some oozing but none on my exam.   Inpatient Medications    Scheduled Meds:  amiodarone  200 mg Oral Daily   atorvastatin  80 mg Oral Daily   carvedilol  25 mg Oral BID   clopidogrel  75 mg Oral Daily   furosemide  20 mg Intravenous Q12H   insulin aspart  0-9 Units Subcutaneous TID WC   levothyroxine  112 mcg Oral Q0600   rOPINIRole  0.5 mg Oral QHS   sodium chloride flush  3 mL Intravenous Q12H   Continuous Infusions:  sodium chloride     PRN Meds: sodium chloride, acetaminophen, nitroGLYCERIN, ondansetron (ZOFRAN) IV, sodium chloride flush   Vital Signs    Vitals:   08/15/22 2325 08/16/22 0235 08/16/22 0245 08/16/22 0752  BP: (!) 153/69 (!) 150/72  (!) 146/72  Pulse: 64 61  60  Resp: '19 14  20  '$ Temp: 97.6 F (36.4 C) 98 F (36.7 C)  98.1 F (36.7 C)  TempSrc: Oral Oral  Oral  SpO2: 91% 93%  94%  Weight:   67.9 kg   Height:        Intake/Output Summary (Last 24 hours) at 08/16/2022 1114 Last data filed at 08/16/2022 0500 Gross per 24 hour  Intake 240 ml  Output 1800 ml  Net -1560 ml      08/16/2022    2:45 AM 08/15/2022    6:36 AM 08/14/2022    5:50 AM  Last 3 Weights  Weight (lbs) 149 lb 11.1 oz 150 lb 5.7 oz 137 lb  Weight (kg) 67.9 kg 68.2 kg 62.143 kg      Telemetry    nsr - Personally Reviewed  ECG    none - Personally Reviewed  Physical Exam   GEN: No acute distress.   Neck: No JVD Cardiac: RRR, no murmurs, rubs, or gallops.  Respiratory: Clear to auscultation bilaterally. GI: Soft, nontender, non-distended  MS: No edema; No deformity. No hematoma or ecchymosis Neuro:  Nonfocal  Psych: Normal affect   Labs    High Sensitivity Troponin:  No results for input(s): "TROPONINIHS" in the last 720 hours.   Chemistry Recent Labs  Lab 08/14/22 0658 08/15/22 0127  NA 138 135   K 4.6 4.2  CL 107 105  CO2 24 20*  GLUCOSE 136* 242*  BUN 25* 30*  CREATININE 1.63* 1.72*  CALCIUM 9.1 8.3*  GFRNONAA 32* 30*  ANIONGAP 7 10    Lipids No results for input(s): "CHOL", "TRIG", "HDL", "LABVLDL", "LDLCALC", "CHOLHDL" in the last 168 hours.  HematologyNo results for input(s): "WBC", "RBC", "HGB", "HCT", "MCV", "MCH", "MCHC", "RDW", "PLT" in the last 168 hours. Thyroid No results for input(s): "TSH", "FREET4" in the last 168 hours.  BNPNo results for input(s): "BNP", "PROBNP" in the last 168 hours.  DDimer No results for input(s): "DDIMER" in the last 168 hours.   Radiology    DG CHEST PORT 1 VIEW  Result Date: 08/14/2022 CLINICAL DATA:  Shortness of breath EXAM: PORTABLE CHEST 1 VIEW COMPARISON:  05/09/2022 FINDINGS: Left subclavian 2 lead pacer noted. Cardiomegaly with vascular congestion and mid and lower lung interstitial opacities favored to represent edema. Small effusions suspected, larger on the left. There is associated basilar atelectasis and left lower lobe collapse/consolidation. No pneumothorax. Trachea midline. Degenerative changes of the spine. Bones are osteopenic.  Aorta atherosclerotic. IMPRESSION: 1. Cardiomegaly with interstitial edema pattern and small effusions, CHF is favored. 2. Left lower lobe collapse/consolidation. Electronically Signed   By: Jerilynn Mages.  Shick M.D.   On: 08/14/2022 16:30    Cardiac Studies   none  Patient Profile     77 y.o. female admitted for afib ablation and had post op groin bleeding.   Assessment & Plan    Groin bleed - this has improved and on my exam there is no additional bleeding. I would like to have her ambulate. If no more bleeding she can be discharged home. I'll have Dr. Curt Bears let her know when to restart her eliquis. 2. Atrial fib - she is maintaining NSR after ablation.  For questions or updates, please contact Force Please consult [email or web address] for contact info under     Signed, Cristopher Peru,  MD  08/16/2022, 11:14 AM

## 2022-08-16 NOTE — Progress Notes (Signed)
BMP ordered for [facility] office on 08/18/22, follow up with APP Ms Arlington Calix on 08/31/22.

## 2022-08-17 ENCOUNTER — Encounter (HOSPITAL_COMMUNITY): Payer: Self-pay | Admitting: Cardiology

## 2022-08-18 DIAGNOSIS — Z09 Encounter for follow-up examination after completed treatment for conditions other than malignant neoplasm: Secondary | ICD-10-CM | POA: Diagnosis not present

## 2022-08-19 ENCOUNTER — Other Ambulatory Visit: Payer: Self-pay | Admitting: *Deleted

## 2022-08-19 ENCOUNTER — Telehealth: Payer: Self-pay | Admitting: Cardiology

## 2022-08-19 DIAGNOSIS — Z5189 Encounter for other specified aftercare: Secondary | ICD-10-CM

## 2022-08-19 NOTE — Telephone Encounter (Signed)
Patient stated she will be going to [facility] on [address] to do BMET blood draw on 3/6 and wants to confirm order will be sent to them.

## 2022-08-19 NOTE — Telephone Encounter (Signed)
BMET order released and faxed to [facility] Dr. Linna Hoff

## 2022-08-26 DIAGNOSIS — Z5189 Encounter for other specified aftercare: Secondary | ICD-10-CM | POA: Diagnosis not present

## 2022-08-27 LAB — BASIC METABOLIC PANEL
BUN/Creatinine Ratio: 14 (ref 12–28)
BUN: 21 mg/dL (ref 8–27)
CO2: 22 mmol/L (ref 20–29)
Calcium: 8.9 mg/dL (ref 8.7–10.3)
Chloride: 105 mmol/L (ref 96–106)
Creatinine, Ser: 1.47 mg/dL — ABNORMAL HIGH (ref 0.57–1.00)
Glucose: 136 mg/dL — ABNORMAL HIGH (ref 70–99)
Potassium: 4.1 mmol/L (ref 3.5–5.2)
Sodium: 141 mmol/L (ref 134–144)
eGFR: 37 mL/min/{1.73_m2} — ABNORMAL LOW (ref >59)

## 2022-08-28 DIAGNOSIS — G4733 Obstructive sleep apnea (adult) (pediatric): Secondary | ICD-10-CM | POA: Diagnosis not present

## 2022-08-31 ENCOUNTER — Ambulatory Visit: Payer: PPO | Attending: Nurse Practitioner | Admitting: Nurse Practitioner

## 2022-08-31 ENCOUNTER — Encounter: Payer: Self-pay | Admitting: Nurse Practitioner

## 2022-08-31 ENCOUNTER — Telehealth: Payer: Self-pay | Admitting: Nurse Practitioner

## 2022-08-31 VITALS — BP 128/70 | HR 72 | Ht 63.0 in | Wt 128.6 lb

## 2022-08-31 DIAGNOSIS — Z950 Presence of cardiac pacemaker: Secondary | ICD-10-CM

## 2022-08-31 DIAGNOSIS — I4892 Unspecified atrial flutter: Secondary | ICD-10-CM

## 2022-08-31 DIAGNOSIS — I10 Essential (primary) hypertension: Secondary | ICD-10-CM | POA: Diagnosis not present

## 2022-08-31 DIAGNOSIS — I480 Paroxysmal atrial fibrillation: Secondary | ICD-10-CM

## 2022-08-31 DIAGNOSIS — Z7901 Long term (current) use of anticoagulants: Secondary | ICD-10-CM

## 2022-08-31 DIAGNOSIS — I951 Orthostatic hypotension: Secondary | ICD-10-CM

## 2022-08-31 DIAGNOSIS — G4733 Obstructive sleep apnea (adult) (pediatric): Secondary | ICD-10-CM | POA: Diagnosis not present

## 2022-08-31 DIAGNOSIS — I739 Peripheral vascular disease, unspecified: Secondary | ICD-10-CM

## 2022-08-31 DIAGNOSIS — I5022 Chronic systolic (congestive) heart failure: Secondary | ICD-10-CM | POA: Diagnosis not present

## 2022-08-31 DIAGNOSIS — I2510 Atherosclerotic heart disease of native coronary artery without angina pectoris: Secondary | ICD-10-CM

## 2022-08-31 NOTE — Addendum Note (Signed)
Addended by: Finis Bud on: 08/31/2022 05:26 PM   Modules accepted: Level of Service

## 2022-08-31 NOTE — Patient Instructions (Signed)
Medication Instructions:  Your physician recommends that you continue on your current medications as directed. Please refer to the Current Medication list given to you today.  Please call the office with the dosage of your lasix.  Labwork: none  Testing/Procedures: none  Follow-Up:  Your physician recommends that you schedule a follow-up appointment in: 6 months  Any Other Special Instructions Will Be Listed Below (If Applicable).  If you need a refill on your cardiac medications before your next appointment, please call your pharmacy.

## 2022-08-31 NOTE — Progress Notes (Signed)
Echo limited ordered and sent to scheduling.

## 2022-08-31 NOTE — Telephone Encounter (Signed)
Patient states that she is taking furosemide 40 mg. Per Finis Bud, patient is to take furosemide 40 mg as needed. Patient verbalized understanding.

## 2022-08-31 NOTE — Progress Notes (Addendum)
Cardiology Office Note:    Date:  08/31/2022   ID:  GUDRUN CASPARY, DOB [birth year], MRN [id number]  PCP:  Sharilyn Sites, [facility] Cardiologist:  Rozann Lesches, MD Electrophysiologist:  Will Meredith Leeds, MD      Referring MD: Sharilyn Sites, MD   CC: Here for follow-up  History of Present Illness:    ABEEHA BOLLIN is a 77 y.o. female with a hx of the following:  PAD HTN CAD, s/p angioplasty to OM1 in 2016, DES to Cx in 2016, and DES to RCA in 2017 PAF, on Eliquis HFpEF OSA CKD stage 3b (Follows Nephrology) SSS, s/p Medtronic PPM in 2018 Orthostatic hypotension, hx of fall  Seen by Ermalinda Barrios, PA-C 10/2021 and patient reported dizziness after walking short distances. 11/2021, reported being dizzy.  Kidney doctor stopped hydralazine and blood pressure was low.  BP log revealed SBP's in 70 systolic.  Lasix, olmesartan, and potassium were stopped. The next week, she saw Nena Polio, PA-C and reported dizziness was better.  Diltiazem d/c and continued on low-dose Coreg.  Presented to ED 12/2021 after a fall, had mildly displaced fracture of distal shaft of the fifth metatarsal.  She awoke at 4 AM with leg cramps, got up to take a Tylenol, became dizzy, laid her head on the counter, turned and fell and broke her foot, stated she did not think she passed out.  BP was averaging [id number] systolic. Coreg reduced to 6.25 mg BID .   Seen by Dr. Domenic Polite 02/2022.  Was reportedly doing much better, denied any palpitations.  Saw Dr. Curt Bears later that month and reported orthostatic dizziness a few times/week, also occurred at other times as well. Was holding medicines, somewhat helped.  SBP's later on were averaging 170s, referred to hypertension clinic. Amio seemed to make tremors worse, but was willing to continue if Multaq was unaffordable.  Visited ED with substernal CP 04/2022, was SHOB/ with diaphoresis. Had a mechanical fall 5 days PTA, went to UC due to  L-sided rib pain, x-rays negative. Troponin 20 >> 24.  CXR negative.  EKG revealed ST, with IVCD.  Transferred to [facility]. Underwent LHC that revealed mild to moderate diffuse disease of LAD without focal stenosis, patent stents, with chronic occlusion of small caliber first OM branch. Small caliber distal PL artery beyond the stented segment, moderately severe disease at bifurcation, vessels were too small for PCI.  Medical management recommended.  TTE revealed EF 40 to 45%, RWMA's of left ventricle, severe concentric LVH, trivial MR. Successful TEE with DCCV on 11/21. TEE revealed EF 50-55%, severe LVH.   Pre-procedure TEE prior to ablation 07/2022 showed EF 30-35%, mild MR. Pt had shortness of breath, admitted for further observation, given IV lasix and CXR showed cardiomegaly with interstitial edema, small effusion, CHF favored with LLL collapse/consolidation. SOB resolved and post-op bilateral groins oozed blood, eventually resolved. After PVI, converted to NSR on Amiodarone.   Today she presents for follow-up. Doing better since last time I saw her. Denies any further bleeding. Denies any chest pain, shortness of breath, palpitations, syncope, presyncope, dizziness, orthopnea, PND, swelling or significant weight changes, acute bleeding, or claudication. Still feels fatigued. Denies any other issues.   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 h/o PAF/flutter during admission in 2013 for PNA. b. PAF during adm for NSTEMI 07/2015, subsequent paroxysms since then.   B12 deficiency anemia    Branch retinal vein occlusion with macular edema of right eye 10/11/2019   Chronic renal failure, stage 3b ([place])  09/23/2017   Coronary artery disease 11/30/2014   a. remote MI. b. h/o PTCA with scoring balloon to OM1 11/2014. c. NSTEMI 03/2015 s/p DES to prox-mid Cx. d. NSTEMI 07/2015 s/p scoring balloon/PTCA/DES to dRCA with PAF during that admission   Cutaneous lupus erythematosus    Early stage nonexudative age-related macular degeneration of right eye 02/27/2020   Essential hypertension    GERD (gastroesophageal reflux disease)    History of blood transfusion 1980's   2nd surgical procedures   Hypercholesteremia    Hypothyroidism    Myocardial infarction ([place]) 02/2012   NSTEMI (non-ST elevated myocardial infarction) ([place]) 04/02/2015   OSA (obstructive sleep apnea) 05/13/2016   Ovarian tumor    PAD (peripheral artery disease) ([place])    a. s/p LE angio 2015; followed by Dr. Fletcher Anon - managed medically.   Pericardial effusion    a. 06/2016 after ppm - s/p pericardiocentesis.   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 s/p Medtronic PPM 06/2016, c/b lead perf/pericardial effusion.   TIA (transient ischemic attack)    Type 2 diabetes with nephropathy ([place]) 02/29/2012    Past Surgical History:  Procedure Laterality Date   ABDOMINAL AORTAGRAM N/A 01/03/2014   Procedure: ABDOMINAL [name];  Surgeon: Wellington Hampshire, MD;  Location: [facility] CATH [facility];  Service: Cardiovascular;  Laterality: N/A;   ABDOMINAL HYSTERECTOMY  06/22/1970   "partial"   APPENDECTOMY  02/20/1969   ATRIAL FIBRILLATION ABLATION N/A 08/14/2022   Procedure: ATRIAL FIBRILLATION ABLATION;  Surgeon: Constance Haw, MD;  Location: [facility];  Service: Cardiovascular;   Laterality: N/A;   CARDIAC CATHETERIZATION  06/22/2006   Tiny OM-2 with 90% narrowing. Med tx.   CARDIAC CATHETERIZATION N/A 11/30/2014   Procedure: Left Heart Cath and Coronary Angiography;  Surgeon: Troy Sine, MD; LAD 20%, CFX 50%, OM1 95%, right PLB 30%, LV normal    CARDIAC CATHETERIZATION N/A 11/30/2014   Procedure: Coronary Balloon Angioplasty;  Surgeon: Troy Sine, MD;  Angiosculpt scoring balloon and PTCA to the OM1 reducing stenosis from 95% to less than 10%   CARDIAC CATHETERIZATION N/A 04/03/2015   Procedure: Left Heart Cath and Coronary Angiography;  Surgeon: Jolaine Artist, MD; dLAD 50%, CFX 90%, OM1 100%, PLA 15%, LVEDP 13     CARDIAC CATHETERIZATION N/A 04/03/2015   Procedure: Coronary Stent Intervention;  Surgeon: Sherren Mocha, MD; 3.0x18 mm Xience DES to the CFX     CARDIAC CATHETERIZATION N/A 08/02/2015   Procedure: Left Heart Cath and Coronary Angiography;  Surgeon: Troy Sine, MD;  Location: [facility];  Service: Cardiovascular;  Laterality: N/A;   CARDIAC CATHETERIZATION N/A 08/02/2015   Procedure: Coronary Stent Intervention;  Surgeon: Troy Sine, MD;  Location: [facility];  Service: Cardiovascular;  Laterality: N/A;  CARDIAC CATHETERIZATION N/A 06/25/2016   Procedure: Pericardiocentesis;  Surgeon: Will Meredith Leeds, MD;  Location: [facility];  Service: Cardiovascular;  Laterality: N/A;   cardiac stents     CARDIOVERSION N/A 12/15/2017   Procedure: CARDIOVERSION;  Surgeon: Acie Fredrickson, Wonda Cheng, MD;  Location: [facility] ENDOSCOPY;  Service: Cardiovascular;  Laterality: N/A;   CARDIOVERSION N/A 05/12/2022   Procedure: CARDIOVERSION;  Surgeon: Sueanne Margarita, MD;  Location: [facility] ENDOSCOPY;  Service: Cardiovascular;  Laterality: N/A;   CHOLECYSTECTOMY OPEN  02/20/1989   COLONOSCOPY  06/23/2003   Dr. Laural Golden: pancolonic divericula, polyp, path unknown currently   COLONOSCOPY  06/22/2010   Dr. Oneida Alar: Normal TI, scattered diverticula in  entire colon, small internal hemorrhoids, normal colon biopsies. Colonoscopy in 5-10 years.    COLONOSCOPY WITH PROPOFOL N/A 06/02/2021   pancolonic diverticulosis. Two 4-6 mm polyps in transverse colon. Sessile serrated and hyperplastic. 5 year surveillance if benefits outweigh the risks.   COLOSTOMY  05/23/1979   COLOSTOMY REVERSAL  11/21/1979   EP IMPLANTABLE DEVICE N/A 06/25/2016   Procedure: Lead Revision/Repair;  Surgeon: Will Meredith Leeds, MD;  Location: [facility];  Service: Cardiovascular;  Laterality: N/A;   EP IMPLANTABLE DEVICE N/A 06/25/2016   Procedure: Pacemaker Implant;  Surgeon: Will Meredith Leeds, MD;  Location: [facility];  Service: Cardiovascular;  Laterality: N/A;   EXCISIONAL HEMORRHOIDECTOMY  02/20/1969   EYE SURGERY Left 06/22/1998   "branch vein occlusion"   EYE SURGERY Left ~ 2001   "smoothed out wrinkle"   LEFT HEART CATH AND CORONARY ANGIOGRAPHY N/A 05/11/2022   Procedure: LEFT HEART CATH AND CORONARY ANGIOGRAPHY;  Surgeon: Burnell Blanks, MD;  Location: [facility];  Service: Cardiovascular;  Laterality: N/A;   LEFT OOPHORECTOMY  05/23/1979   nicked bowel, peritonitis, colostomy; colostomy reversed 1981    LOWER EXTREMITY ANGIOGRAM N/A 01/03/2014   Procedure: LOWER EXTREMITY ANGIOGRAM;  Surgeon: Wellington Hampshire, MD;  Location: [facility] CATH LAB;  Service: Cardiovascular;  Laterality: N/A;   Nuclear med stress test  10/21/2011   Small area of mild ischemia inferoapically.   PARTIAL HYSTERECTOMY  02/20/1969   left ovaries, then ovaries removed later due tumors    POLYPECTOMY  06/02/2021   Procedure: POLYPECTOMY;  Surgeon: Eloise Harman, DO;  [facility];  Service: Endoscopy;;   right eye surgery  03/2021   RIGHT OOPHORECTOMY  02/20/1969   TEE WITHOUT CARDIOVERSION N/A 05/12/2022   Procedure: TRANSESOPHAGEAL ECHOCARDIOGRAM (TEE);  Surgeon: Sueanne Margarita, MD;  Location: [facility] ENDOSCOPY;  Service: Cardiovascular;   Laterality: N/A;   TEE WITHOUT CARDIOVERSION N/A 08/14/2022   Procedure: TRANSESOPHAGEAL ECHOCARDIOGRAM;  Surgeon: Constance Haw, MD;  Location: [facility] LAB;  Service: Cardiovascular;  Laterality: N/A;   Current Meds  Medication Sig   acetaminophen (TYLENOL) 325 MG tablet Take 650 mg by mouth every 6 (six) hours as needed for headache.   amiodarone (PACERONE) 200 MG tablet Take 1 tablet (200 mg total) by mouth daily.   apixaban (ELIQUIS) 5 MG TABS tablet TAKE ONE TABLET BY MOUTH TWICE DAILY   atorvastatin (LIPITOR) 80 MG tablet TAKE ONE TABLET BY MOUTH EVERY DAY AT 6:00PM (Patient taking differently: Take 80 mg by mouth daily.)   calcitRIOL (ROCALTROL) 0.25 MCG capsule Take 0.25 mcg by mouth every Monday, Wednesday, and Friday.   carvedilol (COREG) 25 MG tablet Take 1 tablet (25 mg total) by mouth 2 (two) times daily.   Cholecalciferol (VITAMIN D3 PO) Take 2,000  Units by mouth every evening.   clopidogrel (PLAVIX) 75 MG tablet TAKE ONE TABLET ('75MG'$  TOTAL) BY MOUTH DAILY   clotrimazole-betamethasone (LOTRISONE) cream Apply 1 Application topically 2 (two) times daily.   Continuous Blood Gluc Receiver (FREESTYLE LIBRE 2 READER) DEVI 1 each by Does not apply route daily.   Continuous Blood Gluc Sensor (FREESTYLE LIBRE 2 SENSOR) MISC USE ONE FOR FOURTEEN DAYS   glucose blood (FREESTYLE PRECISION NEO TEST) test strip Use as instructed   Insulin NPH, Human,, Isophane, (NOVOLIN N FLEXPEN) 100 UNIT/ML Kiwkpen INJECT 25 UNITS EVERY MORNING and INJECT 15 UNITS BEFORE dinner (Patient taking differently: Inject 10-20 Units into the skin See admin instructions. INJECT 20 UNITS EVERY MORNING and INJECT 10 UNITS BEFORE bedtime)   levothyroxine (SYNTHROID) 112 MCG tablet TAKE ONE TABLET BY MOUTH ONCE DAILY   mupirocin ointment (BACTROBAN) 2 % Apply 1 Application topically 3 (three) times daily.   nitroGLYCERIN (NITROSTAT) 0.4 MG SL tablet Place 1 tablet (0.4 mg total) under the tongue every 5 (five)  minutes x 3 doses as needed for chest pain (If not relief after 3rd dose, call 911 or go to ED).   rOPINIRole (REQUIP) 0.5 MG tablet Take 0.5 mg by mouth at bedtime.    Semaglutide, 2 MG/DOSE, 8 MG/3ML SOPN Inject 2 mg as directed once a week.   Allergies:   Penicillins and Percocet [oxycodone-acetaminophen]   Social History   Socioeconomic History   Marital status: Married    Spouse name: Not on file   Number of children: Not on file   Years of education: Not on file   Highest education level: Not on file  Occupational History   Occupation: Retired    Fish farm manager: RETIRED    Comment: insurance billing  Tobacco Use   Smoking status: Never    Passive exposure: Never   Smokeless tobacco: Never   Tobacco comments:    Never smoked  Vaping Use   Vaping Use: Never used  Substance and Sexual Activity   Alcohol use: No    Alcohol/week: 0.0 standard drinks of alcohol   Drug use: No   Sexual activity: Never    Birth control/protection: Surgical    Comment: hyst  Other Topics Concern   Not on file  Social History Narrative   Not on file   Social Determinants of Health   Financial Resource Strain: Not on file  Food Insecurity: No Food Insecurity (05/10/2022)   Hunger Vital Sign    Worried About Running Out of Food in the Last Year: Never true    Ran Out of Food in the Last Year: Never true  Transportation Needs: No Transportation Needs (05/10/2022)   PRAPARE - Hydrologist (Medical): No    Lack of Transportation (Non-Medical): No  Physical Activity: Not on file  Stress: Not on file  Social Connections: Not on file     Family History: The patient's family history includes Diabetes in her brother; Heart disease in her brother, brother, father, mother, and sister; Lupus in her daughter; Thyroid disease in her brother and brother. There is no history of Colon cancer.  ROS:    Please see the history of present illness.    All other systems reviewed  and are negative.  EKGs/Labs/Other Studies Reviewed:    The following studies were reviewed today:   EKG:  EKG is ordered today and demonstrates A-flutter/A-fib 73 bpm, with LVH and repolarization abnormality, otherwise nothing acute.    TEE on  05/12/2022: 1. Left ventricular ejection fraction, by estimation, is 50 to 55%. The  left ventricle has low normal function. The left ventricle has no regional  wall motion abnormalities. There is severe concentric left ventricular  hypertrophy.   2. Right ventricular systolic function is normal. The right ventricular  size is normal.   3. Left atrial size was mildly dilated. No left atrial/left atrial  appendage thrombus was detected. The LAA emptying velocity was 16 cm/s.   4. The mitral valve is normal in structure. Mild mitral valve  regurgitation. No evidence of mitral stenosis.   5. The aortic valve is tricuspid. Aortic valve regurgitation is not  visualized. Aortic valve sclerosis/calcification is present, without any  evidence of aortic stenosis.   6. The inferior vena cava is normal in size with greater than 50%  respiratory variability, suggesting right atrial pressure of 3 mmHg.   Conclusion(s)/Recommendation(s): Normal biventricular function without  evidence of hemodynamically significant valvular heart disease.   Left heart cath on 05/11/22:   Prox Cx to Mid Cx lesion is 10% stenosed.   1st Mrg lesion is 100% stenosed.   Ost LAD to Mid LAD lesion is 30% stenosed.   Mid LAD to Dist LAD lesion is 30% stenosed.   Ost RCA to Prox RCA lesion is 40% stenosed.   RPAV-1 lesion is 20% stenosed.   RPAV-2 lesion is 70% stenosed.   2nd RPL lesion is 70% stenosed.   The LAD is a moderate caliber vessel that courses to the apex. There appears to be diffuse mild to moderate disease in the entire LAD without a focal stenosis.  2. The Circumflex has a patent proximal stented segment. This vessel terminates into an obtuse marginal branch.  The small caliber first obtuse marginal branch is chronically occluded.  3. The RCA is a large caliber dominant vessel with a patent stent in the posterolateral artery. The small caliber distal posterolateral artery beyond the stented segment has moderately severe disease at a bifurcation but the vessels appear too small for PCI.  4. Normal LVEDP   Recommendations: Medical management of CAD. Consider adding Imdur or Ranexa for angina in setting of diffuse small vessel disease.    2D Echo complete on 05/10/2022: 1. Severe hypokinesis of the anteroseptal wall with overall mild LV  dysfunction.   2. Left ventricular ejection fraction, by estimation, is 40 to 45%. The  left ventricle has mildly decreased function. The left ventricle  demonstrates regional wall motion abnormalities (see scoring  diagram/findings for description). There is severe  concentric left ventricular hypertrophy. Left ventricular diastolic  parameters are indeterminate.   3. Right ventricular systolic function is normal. The right ventricular  size is normal.   4. The mitral valve is normal in structure. Trivial mitral valve  regurgitation. No evidence of mitral stenosis.   5. The aortic valve is tricuspid. Aortic valve regurgitation is not  visualized. Aortic valve sclerosis is present, with no evidence of aortic  valve stenosis.   6. The inferior vena cava is normal in size with greater than 50%  respiratory variability, suggesting right atrial pressure of 3 mmHg.  Lexiscan on 01/20/2018: Downsloping ST segment depression ST segment depression of 0.5 mm was noted during stress in the II, III, aVF, V5 and V6 leads. Defect 1: There is a small defect of mild severity present in the apical anterior and apical septal location. This likely represents soft tissue attenuation. A small degree of scar cannot entirely be excluded. No signficant ischemia.  This is a low risk study. Nuclear stress EF: 64%.  Left heart cath on  08/02/2015: 1st RPLB lesion, 50% stenosed. Dist RCA lesion, 30% stenosed. Ost 1st Mrg to 1st Mrg lesion, 100% stenosed. Ost LAD lesion, 40% stenosed. Mid LAD lesion, 40% stenosed. Post Atrio lesion, 90% stenosed. Post intervention, there is a 0% residual stenosis. The left ventricular systolic function is normal. Mid RCA lesion, 20% stenosed.   Normal LV function without focal segmental wall motion abnormalities and ejection fraction of 55%.   Significant coronary obstructive disease with diffuse mild luminal narrowing of the LAD with 40% proximal and mid stenoses; widely patent stent in the proximal circumflex with old occlusion of the marginal branch which had arisen in the region of the stented segment but with evidence for very faint collateralization to this distal marginal vessel from the the LAD; a large dominant RCA with 20%, mild mid narrowing, 30% narrowing after the acute margin and focal 90% stenosis distally prior to a PDA 2 vessel with 50% narrowing at the ostium of this PDA prior to a bend in the vessel with the RCA ending in the PLA vessel.   Successful percutaneous cardiac intervention to the distal RCA treated with Angiosculpt scoring balloon, PTCA, and ultimate stenting with a 2.58 mm Xience Alpine DES stent postdilated to 2.51 mm with a 90% stenosis reduced to 0% and no change in the ostial PDA narrowing.   RECOMMENDATION: Since the patient suffered a non-ST segment elevation myocardial infarction on Plavix, Brilinta was administered for oral antiplatelet therapy.  It was felt most likely the patient's atrial fibrillation was ischemic mediated.  If anticoagulation is necessary, Brilinta will need to be discontinued and changed back to Plavix due to potential bleed risk if  triple therapy is needed short-term.  Recent Labs: 03/17/2022: ALT 27; TSH 0.571 05/10/2022: Magnesium 1.8 07/23/2022: Hemoglobin 12.0; Platelets 256 08/26/2022: BUN 21; Creatinine, Ser 1.47; Potassium 4.1;  Sodium 141  Recent Lipid Panel    Component Value Date/Time   CHOL 96 05/10/2022 0158   TRIG 156 (H) 05/10/2022 0158   HDL 30 (L) 05/10/2022 0158   CHOLHDL 3.2 05/10/2022 0158   VLDL 31 05/10/2022 0158   LDLCALC 35 05/10/2022 0158   LDLCALC 96 08/05/2017 0807     Risk Assessment/Calculations:    CHA2DS2-VASc Score = 6  This indicates a 9.7% annual risk of stroke. The patient's score is based upon: CHF History: 1 HTN History: 1 Diabetes History: 0 Stroke History: 0 Vascular Disease History: 1 Age Score: 2 Gender Score: 1   Physical Exam:    VS:  BP 128/70   Pulse 72   Ht '5\' 3"'$  (1.6 m)   Wt 128 lb 9.6 oz (58.3 kg)   SpO2 98%   BMI 22.78 kg/m     Wt Readings from Last 3 Encounters:  08/31/22 128 lb 9.6 oz (58.3 kg)  08/16/22 149 lb 11.1 oz (67.9 kg)  07/15/22 136 lb 6.4 oz (61.9 kg)     GEN: Well nourished, well developed in no acute distress HEENT: Normal NECK: No JVD; No carotid bruits CARDIAC: S1/S2, regular rate and irregular rhythm, no murmurs, rubs, gallops; 2+ pulses  RESPIRATORY:  Clear and diminished to auscultation without rales, wheezing or rhonchi  MUSCULOSKELETAL:  No edema; No deformity  SKIN: Thin skin, warm and dry NEUROLOGIC:  Alert and oriented x 3, improved tremors  PSYCHIATRIC:  Normal affect   ASSESSMENT:    1. PAF (paroxysmal atrial fibrillation) ([place])  2. Atrial flutter, unspecified type (HCC)   3. Current use of long term anticoagulation   4. CAD in native artery   5. PAD (peripheral artery disease) (HCC)   6. Chronic systolic CHF (congestive heart failure) (HCC)   7. Essential hypertension, benign   8. S/P placement of cardiac pacemaker   9. Orthostatic hypotension   10. OSA on CPAP     PLAN:    In order of problems listed above:   PAF/A-flutter, s/p PVI ablation 07/2022, long term anticoagulation  Twelve-lead EKG today shows A-fib/A-flutter, rate controlled.  CHA2DS2-VASc score 6. Hx of DCCV and recent ablation 07/2022.  Continue current medication regimen.  Currently she is on appropriate dosage of Eliquis, denies any issues.  Discussed if weight declines, plan to reduce Eliquis to 2.5 mg twice daily. Discussed avoiding triggers to A-fib.  Continue rest of current medication regimen. Continue to follow-up with up with A-fib clinic and EP.   CAD, s/p angioplasty to OM1 in 2016, DES to circumflex in 2016, and DES to RCA in 2017 Denies any CP. No indication for ischemic evaluation. Continue current meds. ED precautions discussed. Heart healthy diet and regular cardiovascular exercise encouraged.   3. PAD ABI's in 2015 revealed moderate depression of resting left ABI to 0.66, segmental analysis revealed pattern of tibial and potentially distal SFA/popliteal disease with digital disease also suspected; no evidence of significant arterial occlusive disease in right lower extremity.  She denies any recent claudication symptoms.  Continue current medication regimen.  4. Chronic systolic CHF 2D complete echo on 11/19 revealed EF 40 to 45% with severe hypokinesis of the anteroseptal wall/overall mild LV dysfunction noted, severe concentric LVH.  Recent TEE revealed EF 30 to 35%. Euvolemic and well compensated on exam. Low sodium diet, fluid restriction <2L, and daily weights encouraged. Educated to contact our office for weight gain of 2 lbs overnight or 5 lbs in one week. Continue carvedilol, unable to tolerate ARB, d/t kidney disease. Heart healthy diet and regular cardiovascular exercise encouraged. Recommended to call office with Lasix medication information and advised to take PRN for shortness of breath, swelling or weight gain. Consider SGLT2i/spironolactone in future if EF continues to decline for GDMT. Will route note to Dr. Diona Browner with consideration for repeating limited TTE to evaluate EF.   Addendum 08/31/2022 -consulted Dr. Diona Browner who agreed with limited echo.  Will arrange this for her to evaluate EF.  Addendum  - limited Echo revealed EF 40-45%. Dr. Diona Browner reviewed images and stated, "Patient noted to be in atrial flutter during the study and chart indicates plan for follow-up cardioversion.  Would keep follow-up given upward trend in LVEF, we may want to repeat this later once she has been back in sinus rhythm longer."  5. Hypertension Blood pressure stable. No medication chages at this time. Discussed to monitor BP at home at least 2 hours after medications and sitting for 5-10 minutes.  BP log given today.  Continue rest of medication regimen.  Heart healthy diet and regular cardiovascular exercise encouraged.   6. SSS, s/p Pacemaker Remote device check 06/2022 persistent A-fib. Continue to follow up with EP.   7. Orthostatic hypotension Denies any recent symptoms.  Continue current medication regimen.  Continue to follow with PCP.  8. OSA on CPAP Does report wearing CPAP. Continue to follow with PCP.  9.  Disposition: Follow-up with me or Dr. Diona Browner in 6 to 8 weeks or sooner if anything changes.     Medication Adjustments/Labs and Tests  Ordered: Current medicines are reviewed at length with the patient today.  Concerns regarding medicines are outlined above.  Orders Placed This Encounter  Procedures   EKG 12-Lead   No orders of the defined types were placed in this encounter.   Patient Instructions  Medication Instructions:  Your physician recommends that you continue on your current medications as directed. Please refer to the Current Medication list given to you today.  Please call the office with the dosage of your lasix.  Labwork: none  Testing/Procedures: none  Follow-Up:  Your physician recommends that you schedule a follow-up appointment in: 6 months  Any Other Special Instructions Will Be Listed Below (If Applicable).  If you need a refill on your cardiac medications before your next appointment, please call your pharmacy.    SignedSharlene Dory, NP   08/31/2022 12:47 PM    [facility]

## 2022-08-31 NOTE — Telephone Encounter (Signed)
Pt c/o medication issue:  1. Name of Medication: Furosemide '40mg'$    2. How are you currently taking this medication (dosage and times per day)?   3. Are you having a reaction (difficulty breathing--STAT)?   4. What is your medication issue?  Pt told to c/b to inform Arlington Calix, NP that she has Furosemide '40mg'$  tablets, not sure how often she should be taking

## 2022-08-31 NOTE — Addendum Note (Signed)
Addended by: Sung Amabile on: 08/31/2022 01:29 PM   Modules accepted: Orders

## 2022-09-01 ENCOUNTER — Telehealth: Payer: Self-pay

## 2022-09-01 ENCOUNTER — Encounter: Payer: Self-pay | Admitting: Cardiology

## 2022-09-01 DIAGNOSIS — R931 Abnormal findings on diagnostic imaging of heart and coronary circulation: Secondary | ICD-10-CM

## 2022-09-01 MED ORDER — APIXABAN 5 MG PO TABS: 5.0000 mg | ORAL_TABLET | Freq: Two times a day (BID) | ORAL | 0 refills | Status: DC

## 2022-09-01 NOTE — Telephone Encounter (Signed)
Virginia Bud, NP  Please tell her I discussed this with her cardiologist, Dr. Domenic Polite. Prior to the ablation, a sonogram saw that her heart pumping function was reduced and she did have shortness of breath when she was in the hospital and received IV Lasix to help improve this. Dr. Domenic Polite and I would like for this to be done to re-evaluate her heart pumping function.  Thanks!      Left message to return call.

## 2022-09-01 NOTE — Telephone Encounter (Signed)
I discussed reason why she needed a repeat echo, she is in agreement  Echo order placed, sent to schedulers

## 2022-09-04 ENCOUNTER — Encounter (HOSPITAL_COMMUNITY): Payer: Self-pay

## 2022-09-04 ENCOUNTER — Ambulatory Visit (HOSPITAL_COMMUNITY)
Admission: RE | Admit: 2022-09-04 | Discharge: 2022-09-04 | Disposition: A | Discharge status: Home / Self Care | Payer: PPO | Source: Ambulatory Visit | Attending: Adult Health | Admitting: Adult Health

## 2022-09-04 DIAGNOSIS — Z1231 Encounter for screening mammogram for malignant neoplasm of breast: Secondary | ICD-10-CM | POA: Diagnosis not present

## 2022-09-07 DIAGNOSIS — H34831 Tributary (branch) retinal vein occlusion, right eye, with macular edema: Secondary | ICD-10-CM | POA: Diagnosis not present

## 2022-09-07 DIAGNOSIS — H348122 Central retinal vein occlusion, left eye, stable: Secondary | ICD-10-CM | POA: Diagnosis not present

## 2022-09-07 DIAGNOSIS — H353124 Nonexudative age-related macular degeneration, left eye, advanced atrophic with subfoveal involvement: Secondary | ICD-10-CM | POA: Diagnosis not present

## 2022-09-07 DIAGNOSIS — H35041 Retinal micro-aneurysms, unspecified, right eye: Secondary | ICD-10-CM | POA: Diagnosis not present

## 2022-09-07 DIAGNOSIS — H353111 Nonexudative age-related macular degeneration, right eye, early dry stage: Secondary | ICD-10-CM | POA: Diagnosis not present

## 2022-09-07 DIAGNOSIS — E113411 Type 2 diabetes mellitus with severe nonproliferative diabetic retinopathy with macular edema, right eye: Secondary | ICD-10-CM | POA: Diagnosis not present

## 2022-09-07 LAB — HM DIABETES EYE EXAM

## 2022-09-08 ENCOUNTER — Encounter: Payer: Self-pay | Admitting: Internal Medicine

## 2022-09-14 ENCOUNTER — Ambulatory Visit (HOSPITAL_COMMUNITY)
Admission: RE | Admit: 2022-09-14 | Discharge: 2022-09-14 | Disposition: A | Discharge status: Home / Self Care | Payer: PPO | Source: Ambulatory Visit | Attending: Cardiology | Admitting: Cardiology

## 2022-09-14 ENCOUNTER — Encounter: Payer: HMO | Admitting: Cardiology

## 2022-09-14 VITALS — BP 166/80 | HR 74 | Ht 63.0 in | Wt 129.4 lb

## 2022-09-14 DIAGNOSIS — Z79899 Other long term (current) drug therapy: Secondary | ICD-10-CM | POA: Diagnosis not present

## 2022-09-14 DIAGNOSIS — I5022 Chronic systolic (congestive) heart failure: Secondary | ICD-10-CM | POA: Diagnosis not present

## 2022-09-14 DIAGNOSIS — Z8249 Family history of ischemic heart disease and other diseases of the circulatory system: Secondary | ICD-10-CM | POA: Diagnosis not present

## 2022-09-14 DIAGNOSIS — Z5181 Encounter for therapeutic drug level monitoring: Secondary | ICD-10-CM | POA: Diagnosis not present

## 2022-09-14 DIAGNOSIS — I495 Sick sinus syndrome: Secondary | ICD-10-CM | POA: Insufficient documentation

## 2022-09-14 DIAGNOSIS — I2510 Atherosclerotic heart disease of native coronary artery without angina pectoris: Secondary | ICD-10-CM | POA: Diagnosis not present

## 2022-09-14 DIAGNOSIS — I484 Atypical atrial flutter: Secondary | ICD-10-CM | POA: Diagnosis not present

## 2022-09-14 DIAGNOSIS — I4892 Unspecified atrial flutter: Secondary | ICD-10-CM | POA: Diagnosis not present

## 2022-09-14 DIAGNOSIS — Z950 Presence of cardiac pacemaker: Secondary | ICD-10-CM | POA: Diagnosis not present

## 2022-09-14 DIAGNOSIS — G4733 Obstructive sleep apnea (adult) (pediatric): Secondary | ICD-10-CM | POA: Diagnosis not present

## 2022-09-14 DIAGNOSIS — I4819 Other persistent atrial fibrillation: Secondary | ICD-10-CM | POA: Diagnosis not present

## 2022-09-14 DIAGNOSIS — D6869 Other thrombophilia: Secondary | ICD-10-CM

## 2022-09-14 NOTE — H&P (View-Only) (Signed)
  Primary Care Physician: Golding, John, MD Primary Cardiologist: Dr McDowell  Primary Electrophysiologist: Dr Camnitz  Referring Physician: Dr Camnitz    Virginia Huber is a 76 y.o. female with a history of CAD, PAD, HFrEF, HTN, SSS s/p PPM, OSA, atrial flutter, atrial fibrillation who presents for follow up in the [facility]. She was hospitalized December 2017 with atrial fibrillation rapid rates. She was cardioverted to junctional rhythm. She is now status post Medtronic dual-chamber pacemaker. She has been maintained on amiodarone but has continued to have episodes of rapid afib requiring hospitalization. She is now s/p afib and flutter ablation with Dr Camnitz. Patient is on Eliquis for a CHADS2VASC score of 6. She did have some oozing from her groin sites and was kept two additional nights post ablation.   On follow up today, patient reports that she felt "great" for about one week post ablation but then starting having symptoms of weakness and fatigue. She is in atrial flutter today. She denies chest pain, swallowing pain, or groin issues.   Today, she denies symptoms of palpitations, chest pain, shortness of breath, orthopnea, PND, lower extremity edema, dizziness, presyncope, syncope, bleeding, or neurologic sequela. The patient is tolerating medications without difficulties and is otherwise without complaint today.    Atrial Fibrillation Risk Factors:  she does have symptoms or diagnosis of sleep apnea. she is compliant with CPAP therapy. she does not have a history of rheumatic fever.   she has a BMI of Body mass index is 22.92 kg/m.. Filed Weights   09/14/22 1005  Weight: 58.7 kg    Family History  Problem Relation Age of Onset   Heart disease Mother        deceased   Heart disease Father        deceased, heart disease   Diabetes Brother    Heart disease Brother    Thyroid disease Brother    Heart disease Sister    Heart disease  Brother    Thyroid disease Brother    Lupus Daughter    Colon cancer Neg Hx      Atrial Fibrillation Management history:  Previous antiarrhythmic drugs: amiodarone  Previous cardioversions: 2019, 05/12/22 Previous ablations: 08/14/22 CHADS2VASC score: 6 Anticoagulation history: Eliquis   Past Medical History:  Diagnosis Date   (HFpEF) heart failure with preserved ejection fraction (HCC)    Advanced nonexudative age-related macular degeneration of left eye with subfoveal involvement 06/26/2020   Ongoing, accounts for acuity   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 h/o PAF/flutter during admission in 2013 for PNA. b. PAF during adm for NSTEMI 07/2015, subsequent paroxysms since then.   B12 deficiency anemia    Branch retinal vein occlusion with macular edema of right eye 10/11/2019   Chronic renal failure, stage 3b (HCC) 09/23/2017   Coronary artery disease 11/30/2014   a. remote MI. b. h/o PTCA with scoring balloon to OM1 11/2014. c. NSTEMI 03/2015 s/p DES to prox-mid Cx. d. NSTEMI 07/2015 s/p scoring balloon/PTCA/DES to dRCA with PAF during that admission   Cutaneous lupus erythematosus    Early stage nonexudative age-related macular degeneration of right eye 02/27/2020   Essential hypertension    GERD (gastroesophageal reflux disease)    History of blood transfusion 1980's   2nd surgical procedures   Hypercholesteremia    Hypothyroidism    Myocardial infarction (HCC) 02/2012   NSTEMI (non-ST elevated myocardial   infarction) (HCC) 04/02/2015   OSA (obstructive sleep apnea) 05/13/2016   Ovarian tumor    PAD (peripheral artery disease) (HCC)    a. s/p LE angio 2015; followed by Dr. Arida - managed medically.   Pericardial effusion    a. 06/2016 after ppm - s/p pericardiocentesis.   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 s/p Medtronic PPM 06/2016, c/b lead perf/pericardial effusion.   TIA (transient ischemic attack)    Type 2 diabetes with nephropathy (HCC) 02/29/2012   Past Surgical History:  Procedure Laterality Date   ABDOMINAL AORTAGRAM N/A 01/03/2014   Procedure: ABDOMINAL AORTAGRAM;  Surgeon: Muhammad A Arida, MD;  Location: [facility];  Service: Cardiovascular;  Laterality: N/A;   ABDOMINAL HYSTERECTOMY  06/22/1970   "partial"   APPENDECTOMY  02/20/1969   ATRIAL FIBRILLATION ABLATION N/A 08/14/2022   Procedure: ATRIAL FIBRILLATION ABLATION;  Surgeon: Camnitz, Will Martin, MD;  Location: [facility];  Service: Cardiovascular;  Laterality: N/A;   CARDIAC CATHETERIZATION  06/22/2006   Tiny OM-2 with 90% narrowing. Med tx.   CARDIAC CATHETERIZATION N/A 11/30/2014   Procedure: Left Heart Cath and Coronary Angiography;  Surgeon: Thomas A Kelly, MD; LAD 20%, CFX 50%, OM1 95%, right PLB 30%, LV normal    CARDIAC CATHETERIZATION N/A 11/30/2014   Procedure: Coronary Balloon Angioplasty;  Surgeon: Thomas A Kelly, MD;  Angiosculpt scoring balloon and PTCA to the OM1 reducing stenosis from 95% to less than 10%   CARDIAC CATHETERIZATION N/A 04/03/2015   Procedure: Left Heart Cath and Coronary Angiography;  Surgeon: Daniel R Bensimhon, MD; dLAD 50%, CFX 90%, OM1 100%, PLA 15%, LVEDP 13     CARDIAC CATHETERIZATION N/A 04/03/2015   Procedure: Coronary Stent Intervention;  Surgeon: Michael Cooper, MD; 3.0x18 mm Xience DES to the CFX     CARDIAC CATHETERIZATION N/A 08/02/2015   Procedure: Left Heart Cath and Coronary Angiography;  Surgeon: Thomas A Kelly, MD;  [facility]: [facility];  Service: Cardiovascular;  Laterality: N/A;    CARDIAC CATHETERIZATION N/A 08/02/2015   Procedure: Coronary Stent Intervention;  Surgeon: Thomas A Kelly, MD;  [facility]: [facility];  Service: Cardiovascular;  Laterality: N/A;   CARDIAC CATHETERIZATION N/A 06/25/2016   Procedure: Pericardiocentesis;  Surgeon: Will Martin Camnitz, MD;  Location: [facility];  Service: Cardiovascular;  Laterality: N/A;   cardiac stents     CARDIOVERSION N/A 12/15/2017   Procedure: CARDIOVERSION;  Surgeon: Nahser, Philip J, MD;  Location: [facility];  Service: Cardiovascular;  Laterality: N/A;   CARDIOVERSION N/A 05/12/2022   Procedure: CARDIOVERSION;  Surgeon: Turner, Traci R, MD;  Location: [facility];  Service: Cardiovascular;  Laterality: N/A;   CHOLECYSTECTOMY OPEN  02/20/1989   COLONOSCOPY  06/23/2003   Dr. Rehman: pancolonic divericula, polyp, path unknown currently   COLONOSCOPY  06/22/2010   Dr. Fields: Normal TI, scattered diverticula in entire colon, small internal hemorrhoids, normal colon biopsies. Colonoscopy in 5-10 years.    COLONOSCOPY WITH PROPOFOL N/A 06/02/2021   pancolonic diverticulosis. Two 4-6 mm polyps in transverse colon. Sessile serrated and hyperplastic. 5 year surveillance if benefits outweigh the risks.   COLOSTOMY    05/23/1979   COLOSTOMY REVERSAL  11/21/1979   EP IMPLANTABLE DEVICE N/A 06/25/2016   Procedure: Lead Revision/Repair;  Surgeon: Will Martin Camnitz, MD;  [facility]: [facility];  Service: Cardiovascular;  Laterality: N/A;   EP IMPLANTABLE DEVICE N/A 06/25/2016   Procedure: Pacemaker Implant;  Surgeon: Will Martin Camnitz, MD;  [facility]: [facility];  Service: Cardiovascular;  Laterality: N/A;   EXCISIONAL HEMORRHOIDECTOMY  02/20/1969   EYE SURGERY Left 06/22/1998   "branch vein occlusion"   EYE SURGERY Left ~ 2001   "smoothed out wrinkle"   LEFT HEART CATH AND CORONARY ANGIOGRAPHY N/A 05/11/2022   Procedure: LEFT HEART CATH AND CORONARY ANGIOGRAPHY;  Surgeon: McAlhany, Christopher  D, MD;  Location: [facility];  Service: Cardiovascular;  Laterality: N/A;   LEFT OOPHORECTOMY  05/23/1979   nicked bowel, peritonitis, colostomy; colostomy reversed 1981    LOWER EXTREMITY ANGIOGRAM N/A 01/03/2014   Procedure: LOWER EXTREMITY ANGIOGRAM;  Surgeon: Muhammad A Arida, MD;  Location: [facility];  Service: Cardiovascular;  Laterality: N/A;   Nuclear med stress test  10/21/2011   Small area of mild ischemia inferoapically.   PARTIAL HYSTERECTOMY  02/20/1969   left ovaries, then ovaries removed later due tumors    POLYPECTOMY  06/02/2021   Procedure: POLYPECTOMY;  Surgeon: Carver, Charles K, DO;  Location: [facility];  Service: Endoscopy;;   right eye surgery  03/2021   RIGHT OOPHORECTOMY  02/20/1969   TEE WITHOUT CARDIOVERSION N/A 05/12/2022   Procedure: TRANSESOPHAGEAL ECHOCARDIOGRAM (TEE);  Surgeon: Turner, Traci R, MD;  Location: [facility];  Service: Cardiovascular;  Laterality: N/A;   TEE WITHOUT CARDIOVERSION N/A 08/14/2022   Procedure: TRANSESOPHAGEAL ECHOCARDIOGRAM;  Surgeon: Camnitz, Will Martin, MD;  [facility]: [facility];  Service: Cardiovascular;  Laterality: N/A;    Current Outpatient Medications  Medication Sig Dispense Refill   acetaminophen (TYLENOL) 325 MG tablet Take 650 mg by mouth as needed for headache.     amiodarone (PACERONE) 200 MG tablet Take 1 tablet (200 mg total) by mouth daily. 30 tablet 6   apixaban (ELIQUIS) 5 MG TABS tablet TAKE ONE TABLET BY MOUTH TWICE DAILY 60 tablet 5   atorvastatin (LIPITOR) 80 MG tablet TAKE ONE TABLET BY MOUTH EVERY DAY AT 6:00PM (Patient taking differently: Take 80 mg by mouth daily.) 90 tablet 3   calcitRIOL (ROCALTROL) 0.25 MCG capsule Take 0.25 mcg by mouth every Monday, Wednesday, and Friday.     carvedilol (COREG) 25 MG tablet Take 1 tablet (25 mg total) by mouth 2 (two) times daily. 60 tablet 6   Cholecalciferol (VITAMIN D3 PO) Take 2,000 Units by mouth every evening.     clopidogrel (PLAVIX) 75  MG tablet TAKE ONE TABLET (75MG TOTAL) BY MOUTH DAILY 90 tablet 3   Continuous Blood Gluc Receiver (FREESTYLE LIBRE 2 READER) DEVI 1 each by Does not apply route daily. 1 each 0   Continuous Blood Gluc Sensor (FREESTYLE LIBRE 2 SENSOR) MISC USE ONE FOR FOURTEEN DAYS 6 each 3   furosemide (LASIX) 40 MG tablet Take 40 mg by mouth as needed.     glucose blood (FREESTYLE PRECISION NEO TEST) test strip Use as instructed 100 each 3   Insulin NPH, Human,, Isophane, (NOVOLIN N FLEXPEN) 100 UNIT/ML Kiwkpen INJECT 25 UNITS EVERY MORNING and INJECT 15 UNITS BEFORE dinner (Patient taking differently: Inject 10-20 Units into the skin See admin instructions. INJECT 15 UNITS EVERY MORNING and INJECT 8 UNITS BEFORE bedtime) 30 mL 1     levothyroxine (SYNTHROID) 112 MCG tablet TAKE ONE TABLET BY MOUTH ONCE DAILY 90 tablet 2   nitroGLYCERIN (NITROSTAT) 0.4 MG SL tablet Place 1 tablet (0.4 mg total) under the tongue every 5 (five) minutes x 3 doses as needed for chest pain (If not relief after 3rd dose, call 911 or go to ED). 25 tablet 0   rOPINIRole (REQUIP) 0.5 MG tablet Take 0.5 mg by mouth at bedtime.   0   Semaglutide, 2 MG/DOSE, 8 MG/3ML SOPN Inject 2 mg as directed once a week. 9 mL 3   No current facility-administered medications for this encounter.    Allergies  Allergen Reactions   Penicillins Hives    Has patient had a PCN reaction causing immediate rash, facial/tongue/throat swelling, SOB or lightheadedness with hypotension: Yes Has patient had a PCN reaction causing severe rash involving mucus membranes or skin necrosis: No Has patient had a PCN reaction that required hospitalization No Has patient had a PCN reaction occurring within the last 10 years: No If all of the above answers are "NO", then may proceed with Cephalosporin use.   Percocet [Oxycodone-Acetaminophen] Nausea And Vomiting    Social History   Socioeconomic History   Marital status: Married    Spouse name: Not on file   Number of  children: Not on file   Years of education: Not on file   Highest education level: Not on file  Occupational History   Occupation: Retired    Employer: RETIRED    Comment: insurance billing  Tobacco Use   Smoking status: Never    Passive exposure: Never   Smokeless tobacco: Never   Tobacco comments:    Never smoked  Vaping Use   Vaping Use: Never used  Substance and Sexual Activity   Alcohol use: No    Alcohol/week: 0.0 standard drinks of alcohol   Drug use: No   Sexual activity: Never    Birth control/protection: Surgical    Comment: hyst  Other Topics Concern   Not on file  Social History Narrative   Not on file   Social Determinants of Health   Financial Resource Strain: Not on file  Food Insecurity: No Food Insecurity (05/10/2022)   Hunger Vital Sign    Worried About Running Out of Food in the Last Year: Never true    Ran Out of Food in the Last Year: Never true  Transportation Needs: No Transportation Needs (05/10/2022)   PRAPARE - Transportation    Lack of Transportation (Medical): No    Lack of Transportation (Non-Medical): No  Physical Activity: Not on file  Stress: Not on file  Social Connections: Not on file  Intimate Partner Violence: Not At Risk (05/10/2022)   Humiliation, Afraid, Rape, and Kick questionnaire    Fear of Current or Ex-Partner: No    Emotionally Abused: No    Physically Abused: No    Sexually Abused: No     ROS- All systems are reviewed and negative except as per the HPI above.  Physical Exam: Vitals:   09/14/22 1005  BP: (!) 166/80  Pulse: 74  Weight: 58.7 kg  Height: 5' 3" (1.6 m)    GEN- The patient is a well appearing elderly female, alert and oriented x 3 today.   Head- normocephalic, atraumatic Eyes-  Sclera clear, conjunctiva pink Ears- hearing intact Oropharynx- clear Neck- supple  Lungs- Clear to ausculation bilaterally, normal work of breathing Heart- irregular rate and rhythm, no murmurs, rubs or gallops  GI-  soft, NT,   ND, + BS Extremities- no clubbing, cyanosis, or edema MS- no significant deformity or atrophy Skin- no rash or lesion Psych- euthymic mood, full affect Neuro- strength and sensation are intact  Wt Readings from Last 3 Encounters:  09/14/22 58.7 kg  08/31/22 58.3 kg  08/16/22 67.9 kg    EKG today demonstrates  Atrial flutter with variable block, PVC, LBBB Vent. rate 74 BPM PR interval * ms QRS duration 144 ms QT/QTcB 450/499 ms  TEE 08/14/22 demonstrated   1. Left ventricular ejection fraction, by estimation, is 30 to 35%. The  left ventricle has moderately decreased function. The left ventricle  demonstrates global hypokinesis.   2. Right ventricular systolic function is mildly reduced. The right  ventricular size is normal.   3. Left atrial size was mildly dilated. No left atrial/left atrial  appendage thrombus was detected.   4. The mitral valve is normal in structure. Mild mitral valve  regurgitation. No evidence of mitral stenosis.   5. The aortic valve is tricuspid. Aortic valve regurgitation is not  visualized. No aortic stenosis is present.   6. The inferior vena cava is normal in size with greater than 50%  respiratory variability, suggesting right atrial pressure of 3 mmHg.   Epic records are reviewed at length today  CHA2DS2-VASc Score = 6  The patient's score is based upon: CHF History: 1 HTN History: 1 Diabetes History: 0 Stroke History: 0 Vascular Disease History: 1 Age Score: 2 Gender Score: 1       ASSESSMENT AND PLAN: 1. Persistent Atrial Fibrillation/atrial flutter The patient's CHA2DS2-VASc score is 6, indicating a 9.7% annual risk of stroke.   S/p afib and flutter ablation 08/14/22 Patient in atrial flutter today. We discussed rhythm control options, will plan for DCCV.  Continue amiodarone 200 mg daily Check cbc/TSH/cmet prior to DCCV. Continue Eliquis 5 mg BID with no missed doses for 3 months post ablation.  Continue carvedilol  25 mg BID  2. Secondary Hypercoagulable State (ICD10:  D68.69) The patient is at significant risk for stroke/thromboembolism based upon her CHA2DS2-VASc Score of 6.  Continue Apixaban (Eliquis).   3. Chronic systolic CHF EF 30-35% on TEE Repeat echo scheduled for 09/22/22. Appears euvolemic today.  4. Obstructive sleep apnea Encouraged compliance with CPAP therapy.   5. SSS S/p PPM, followed by Dr Camnitz and the device clinic.  6. CAD No anginal symptoms.   Follow up in the AF clinic post DCCV.   Ricky Suesan Mohrmann PA-C [facility] [address] [place], [place] [address] [phone] 09/14/2022 10:37 AM  

## 2022-09-14 NOTE — Progress Notes (Addendum)
Primary Care Physician: Sharilyn Sites, MD Primary Cardiologist: Dr Domenic Polite  Primary Electrophysiologist: Dr Curt Bears  Referring Physician: Dr Virginia Huber is a 77 y.o. female with a history of CAD, PAD, HFrEF, HTN, SSS s/p PPM, OSA, atrial flutter, atrial fibrillation who presents for follow up in the [facility]. She was hospitalized December 2017 with atrial fibrillation rapid rates. She was cardioverted to junctional rhythm. She is now status post Medtronic dual-chamber pacemaker. She has been maintained on amiodarone but has continued to have episodes of rapid afib requiring hospitalization. She is now s/p afib and flutter ablation with Dr Curt Bears. Patient is on Eliquis for a CHADS2VASC score of 6. She did have some oozing from her groin sites and was kept two additional nights post ablation.   On follow up today, patient reports that she felt "great" for about one week post ablation but then starting having symptoms of weakness and fatigue. She is in atrial flutter today. She denies chest pain, swallowing pain, or groin issues.   Today, she denies symptoms of palpitations, chest pain, shortness of breath, orthopnea, PND, lower extremity edema, dizziness, presyncope, syncope, bleeding, or neurologic sequela. The patient is tolerating medications without difficulties and is otherwise without complaint today.    Atrial Fibrillation Risk Factors:  she does have symptoms or diagnosis of sleep apnea. she is compliant with CPAP therapy. she does not have a history of rheumatic fever.   she has a BMI of Body mass index is 22.92 kg/m.[name] Filed Weights   09/14/22 1005  Weight: 58.7 kg    Family History  Problem Relation Age of Onset   Heart disease Mother        deceased   Heart disease Father        deceased, heart disease   Diabetes Brother    Heart disease Brother    Thyroid disease Brother    Heart disease Sister    Heart disease  Brother    Thyroid disease Brother    Lupus Daughter    Colon cancer Neg Hx      Atrial Fibrillation Management history:  Previous antiarrhythmic drugs: amiodarone  Previous cardioversions: 2019, 05/12/22 Previous ablations: 08/14/22 CHADS2VASC score: 6 Anticoagulation history: Eliquis   Past Medical History:  Diagnosis Date   (HFpEF) heart failure with preserved ejection fraction ([place])    Advanced nonexudative age-related macular degeneration of left eye with subfoveal involvement 06/26/2020   Ongoing, accounts for acuity   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 h/o PAF/flutter during admission in 2013 for PNA. b. PAF during adm for NSTEMI 07/2015, subsequent paroxysms since then.   B12 deficiency anemia    Branch retinal vein occlusion with macular edema of right eye 10/11/2019   Chronic renal failure, stage 3b ([place]) 09/23/2017   Coronary artery disease 11/30/2014   a. remote MI. b. h/o PTCA with scoring balloon to OM1 11/2014. c. NSTEMI 03/2015 s/p DES to prox-mid Cx. d. NSTEMI 07/2015 s/p scoring balloon/PTCA/DES to dRCA with PAF during that admission   Cutaneous lupus erythematosus    Early stage nonexudative age-related macular degeneration of right eye 02/27/2020   Essential hypertension    GERD (gastroesophageal reflux disease)    History of blood transfusion 1980's   2nd surgical procedures   Hypercholesteremia    Hypothyroidism    Myocardial infarction ([place]) 02/2012   NSTEMI (non-ST elevated myocardial  infarction) ([place]) 04/02/2015   OSA (obstructive sleep apnea) 05/13/2016   Ovarian tumor    PAD (peripheral artery disease) ([place])    a. s/p LE angio 2015; followed by Dr. Fletcher Anon - managed medically.   Pericardial effusion    a. 06/2016 after ppm - s/p pericardiocentesis.   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 s/p Medtronic PPM 06/2016, c/b lead perf/pericardial effusion.   TIA (transient ischemic attack)    Type 2 diabetes with nephropathy ([place]) 02/29/2012   Past Surgical History:  Procedure Laterality Date   ABDOMINAL AORTAGRAM N/A 01/03/2014   Procedure: ABDOMINAL [name];  Surgeon: Wellington Hampshire, MD;  Location: [facility] CATH LAB;  Service: Cardiovascular;  Laterality: N/A;   ABDOMINAL HYSTERECTOMY  06/22/1970   "partial"   APPENDECTOMY  02/20/1969   ATRIAL FIBRILLATION ABLATION N/A 08/14/2022   Procedure: ATRIAL FIBRILLATION ABLATION;  Surgeon: Constance Haw, MD;  Location: [facility] CV LAB;  Service: Cardiovascular;  Laterality: N/A;   CARDIAC CATHETERIZATION  06/22/2006   Tiny OM-2 with 90% narrowing. Med tx.   CARDIAC CATHETERIZATION N/A 11/30/2014   Procedure: Left Heart Cath and Coronary Angiography;  Surgeon: Troy Sine, MD; LAD 20%, CFX 50%, OM1 95%, right PLB 30%, LV normal    CARDIAC CATHETERIZATION N/A 11/30/2014   Procedure: Coronary Balloon Angioplasty;  Surgeon: Troy Sine, MD;  Angiosculpt scoring balloon and PTCA to the OM1 reducing stenosis from 95% to less than 10%   CARDIAC CATHETERIZATION N/A 04/03/2015   Procedure: Left Heart Cath and Coronary Angiography;  Surgeon: Jolaine Artist, MD; dLAD 50%, CFX 90%, OM1 100%, PLA 15%, LVEDP 13     CARDIAC CATHETERIZATION N/A 04/03/2015   Procedure: Coronary Stent Intervention;  Surgeon: Sherren Mocha, MD; 3.0x18 mm Xience DES to the CFX     CARDIAC CATHETERIZATION N/A 08/02/2015   Procedure: Left Heart Cath and Coronary Angiography;  Surgeon: Troy Sine, MD;  Location: [facility] CV LAB;  Service: Cardiovascular;  Laterality: N/A;    CARDIAC CATHETERIZATION N/A 08/02/2015   Procedure: Coronary Stent Intervention;  Surgeon: Troy Sine, MD;  Location: [facility] CV LAB;  Service: Cardiovascular;  Laterality: N/A;   CARDIAC CATHETERIZATION N/A 06/25/2016   Procedure: Pericardiocentesis;  Surgeon: Will Meredith Leeds, MD;  Location: [facility] CV LAB;  Service: Cardiovascular;  Laterality: N/A;   cardiac stents     CARDIOVERSION N/A 12/15/2017   Procedure: CARDIOVERSION;  Surgeon: Acie Fredrickson, Wonda Cheng, MD;  Location: [facility] ENDOSCOPY;  Service: Cardiovascular;  Laterality: N/A;   CARDIOVERSION N/A 05/12/2022   Procedure: CARDIOVERSION;  Surgeon: Sueanne Margarita, MD;  Location: [facility] ENDOSCOPY;  Service: Cardiovascular;  Laterality: N/A;   CHOLECYSTECTOMY OPEN  02/20/1989   COLONOSCOPY  06/23/2003   Dr. Laural Golden: pancolonic divericula, polyp, path unknown currently   COLONOSCOPY  06/22/2010   Dr. Oneida Alar: Normal TI, scattered diverticula in entire colon, small internal hemorrhoids, normal colon biopsies. Colonoscopy in 5-10 years.    COLONOSCOPY WITH PROPOFOL N/A 06/02/2021   pancolonic diverticulosis. Two 4-6 mm polyps in transverse colon. Sessile serrated and hyperplastic. 5 year surveillance if benefits outweigh the risks.   COLOSTOMY  05/23/1979   COLOSTOMY REVERSAL  11/21/1979   EP IMPLANTABLE DEVICE N/A 06/25/2016   Procedure: Lead Revision/Repair;  Surgeon: Will Meredith Leeds, MD;  Location: [facility] CV LAB;  Service: Cardiovascular;  Laterality: N/A;   EP IMPLANTABLE DEVICE N/A 06/25/2016   Procedure: Pacemaker Implant;  Surgeon: Will Meredith Leeds, MD;  Location: [facility] CV LAB;  Service: Cardiovascular;  Laterality: N/A;   EXCISIONAL HEMORRHOIDECTOMY  02/20/1969   EYE SURGERY Left 06/22/1998   "branch vein occlusion"   EYE SURGERY Left ~ 2001   "smoothed out wrinkle"   LEFT HEART CATH AND CORONARY ANGIOGRAPHY N/A 05/11/2022   Procedure: LEFT HEART CATH AND CORONARY ANGIOGRAPHY;  Surgeon: Burnell Blanks, MD;  Location: [facility] CV LAB;  Service: Cardiovascular;  Laterality: N/A;   LEFT OOPHORECTOMY  05/23/1979   nicked bowel, peritonitis, colostomy; colostomy reversed 1981    LOWER EXTREMITY ANGIOGRAM N/A 01/03/2014   Procedure: LOWER EXTREMITY ANGIOGRAM;  Surgeon: Wellington Hampshire, MD;  Location: [facility] CATH LAB;  Service: Cardiovascular;  Laterality: N/A;   Nuclear med stress test  10/21/2011   Small area of mild ischemia inferoapically.   PARTIAL HYSTERECTOMY  02/20/1969   left ovaries, then ovaries removed later due tumors    POLYPECTOMY  06/02/2021   Procedure: POLYPECTOMY;  Surgeon: Eloise Harman, DO;  [facility];  Service: Endoscopy;;   right eye surgery  03/2021   RIGHT OOPHORECTOMY  02/20/1969   TEE WITHOUT CARDIOVERSION N/A 05/12/2022   Procedure: TRANSESOPHAGEAL ECHOCARDIOGRAM (TEE);  Surgeon: Sueanne Margarita, MD;  [facility] ENDOSCOPY;  Service: Cardiovascular;  Laterality: N/A;   TEE WITHOUT CARDIOVERSION N/A 08/14/2022   Procedure: TRANSESOPHAGEAL ECHOCARDIOGRAM;  Surgeon: Constance Haw, MD;  Location: [facility] CV LAB;  Service: Cardiovascular;  Laterality: N/A;    Current Outpatient Medications  Medication Sig Dispense Refill   acetaminophen (TYLENOL) 325 MG tablet Take 650 mg by mouth as needed for headache.     amiodarone (PACERONE) 200 MG tablet Take 1 tablet (200 mg total) by mouth daily. 30 tablet 6   apixaban (ELIQUIS) 5 MG TABS tablet TAKE ONE TABLET BY MOUTH TWICE DAILY 60 tablet 5   atorvastatin (LIPITOR) 80 MG tablet TAKE ONE TABLET BY MOUTH EVERY DAY AT 6:00PM (Patient taking differently: Take 80 mg by mouth daily.) 90 tablet 3   calcitRIOL (ROCALTROL) 0.25 MCG capsule Take 0.25 mcg by mouth every Monday, Wednesday, and Friday.     carvedilol (COREG) 25 MG tablet Take 1 tablet (25 mg total) by mouth 2 (two) times daily. 60 tablet 6   Cholecalciferol (VITAMIN D3 PO) Take 2,000 Units by mouth every evening.     clopidogrel (PLAVIX) 75  MG tablet TAKE ONE TABLET (75MG  TOTAL) BY MOUTH DAILY 90 tablet 3   Continuous Blood Gluc Receiver (FREESTYLE LIBRE 2 READER) DEVI 1 each by Does not apply route daily. 1 each 0   Continuous Blood Gluc Sensor (FREESTYLE LIBRE 2 SENSOR) MISC USE ONE FOR FOURTEEN DAYS 6 each 3   furosemide (LASIX) 40 MG tablet Take 40 mg by mouth as needed.     glucose blood (FREESTYLE PRECISION NEO TEST) test strip Use as instructed 100 each 3   Insulin NPH, Human,, Isophane, (NOVOLIN N FLEXPEN) 100 UNIT/ML Kiwkpen INJECT 25 UNITS EVERY MORNING and INJECT 15 UNITS BEFORE dinner (Patient taking differently: Inject 10-20 Units into the skin See admin instructions. INJECT 15 UNITS EVERY MORNING and INJECT 8 UNITS BEFORE bedtime) 30 mL 1  levothyroxine (SYNTHROID) 112 MCG tablet TAKE ONE TABLET BY MOUTH ONCE DAILY 90 tablet 2   nitroGLYCERIN (NITROSTAT) 0.4 MG SL tablet Place 1 tablet (0.4 mg total) under the tongue every 5 (five) minutes x 3 doses as needed for chest pain (If not relief after 3rd dose, call 911 or go to ED). 25 tablet 0   rOPINIRole (REQUIP) 0.5 MG tablet Take 0.5 mg by mouth at bedtime.   0   Semaglutide, 2 MG/DOSE, 8 MG/3ML SOPN Inject 2 mg as directed once a week. 9 mL 3   No current facility-administered medications for this encounter.    Allergies  Allergen Reactions   Penicillins Hives    Has patient had a PCN reaction causing immediate rash, facial/tongue/throat swelling, SOB or lightheadedness with hypotension: Yes Has patient had a PCN reaction causing severe rash involving mucus membranes or skin necrosis: No Has patient had a PCN reaction that required hospitalization No Has patient had a PCN reaction occurring within the last 10 years: No If all of the above answers are "NO", then may proceed with Cephalosporin use.   Percocet [Oxycodone-Acetaminophen] Nausea And Vomiting    Social History   Socioeconomic History   Marital status: Married    Spouse name: Not on file   Number of  children: Not on file   Years of education: Not on file   Highest education level: Not on file  Occupational History   Occupation: Retired    Fish farm manager: RETIRED    Comment: insurance billing  Tobacco Use   Smoking status: Never    Passive exposure: Never   Smokeless tobacco: Never   Tobacco comments:    Never smoked  Vaping Use   Vaping Use: Never used  Substance and Sexual Activity   Alcohol use: No    Alcohol/week: 0.0 standard drinks of alcohol   Drug use: No   Sexual activity: Never    Birth control/protection: Surgical    Comment: hyst  Other Topics Concern   Not on file  Social History Narrative   Not on file   Social Determinants of Health   Financial Resource Strain: Not on file  Food Insecurity: No Food Insecurity (05/10/2022)   Hunger Vital Sign    Worried About Running Out of Food in the Last Year: Never true    Ran Out of Food in the Last Year: Never true  Transportation Needs: No Transportation Needs (05/10/2022)   PRAPARE - Hydrologist (Medical): No    Lack of Transportation (Non-Medical): No  Physical Activity: Not on file  Stress: Not on file  Social Connections: Not on file  Intimate Partner Violence: Not At Risk (05/10/2022)   Humiliation, Afraid, Rape, and Kick questionnaire    Fear of Current or Ex-Partner: No    Emotionally Abused: No    Physically Abused: No    Sexually Abused: No     ROS- All systems are reviewed and negative except as per the HPI above.  Physical Exam: Vitals:   09/14/22 1005  BP: (!) 166/80  Pulse: 74  Weight: 58.7 kg  Height: 5\' 3"  (1.6 m)    GEN- The patient is a well appearing elderly female, alert and oriented x 3 today.   Head- normocephalic, atraumatic Eyes-  Sclera clear, conjunctiva pink Ears- hearing intact Oropharynx- clear Neck- supple  Lungs- Clear to ausculation bilaterally, normal work of breathing Heart- irregular rate and rhythm, no murmurs, rubs or gallops  GI-  soft, NT,  ND, + BS Extremities- no clubbing, cyanosis, or edema MS- no significant deformity or atrophy Skin- no rash or lesion Psych- euthymic mood, full affect Neuro- strength and sensation are intact  Wt Readings from Last 3 Encounters:  09/14/22 58.7 kg  08/31/22 58.3 kg  08/16/22 67.9 kg    EKG today demonstrates  Atrial flutter with variable block, PVC, LBBB Vent. rate 74 BPM PR interval * ms QRS duration 144 ms QT/QTcB 450/499 ms  TEE 08/14/22 demonstrated   1. Left ventricular ejection fraction, by estimation, is 30 to 35%. The  left ventricle has moderately decreased function. The left ventricle  demonstrates global hypokinesis.   2. Right ventricular systolic function is mildly reduced. The right  ventricular size is normal.   3. Left atrial size was mildly dilated. No left atrial/left atrial  appendage thrombus was detected.   4. The mitral valve is normal in structure. Mild mitral valve  regurgitation. No evidence of mitral stenosis.   5. The aortic valve is tricuspid. Aortic valve regurgitation is not  visualized. No aortic stenosis is present.   6. The inferior vena cava is normal in size with greater than 50%  respiratory variability, suggesting right atrial pressure of 3 mmHg.   Epic records are reviewed at length today  CHA2DS2-VASc Score = 6  The patient's score is based upon: CHF History: 1 HTN History: 1 Diabetes History: 0 Stroke History: 0 Vascular Disease History: 1 Age Score: 2 Gender Score: 1       ASSESSMENT AND PLAN: 1. Persistent Atrial Fibrillation/atrial flutter The patient's CHA2DS2-VASc score is 6, indicating a 9.7% annual risk of stroke.   S/p afib and flutter ablation 08/14/22 Patient in atrial flutter today. We discussed rhythm control options, will plan for DCCV.  Continue amiodarone 200 mg daily Check cbc/TSH/cmet prior to DCCV. Continue Eliquis 5 mg BID with no missed doses for 3 months post ablation.  Continue carvedilol  25 mg BID  2. Secondary Hypercoagulable State (ICD10:  D68.69) The patient is at significant risk for stroke/thromboembolism based upon her CHA2DS2-VASc Score of 6.  Continue Apixaban (Eliquis).   3. Chronic systolic CHF EF 99991111 on TEE Repeat echo scheduled for 09/22/22. Appears euvolemic today.  4. Obstructive sleep apnea Encouraged compliance with CPAP therapy.   5. SSS S/p PPM, followed by Dr Curt Bears and the device clinic.  6. CAD No anginal symptoms.   Follow up in the AF clinic post DCCV.   [facility] [address] [place],  [address] [phone] 09/14/2022 10:37 AM

## 2022-09-14 NOTE — Patient Instructions (Addendum)
Labs will need to be drawn anytime between April 3rd-8th at any [facility]- orders attached.   Cardioversion scheduled for: Wednesday, April 10th   - Arrive at the [facility] and go to admitting at [facility] not eat or drink anything after midnight the night prior to your procedure.   - Take all your morning medication (except diabetic medications) with a sip of water prior to arrival.  - You will not be able to drive home after your procedure.    - Do NOT miss any doses of your blood thinner - if you should miss a dose please notify our office immediately.   - If you feel as if you go back into normal rhythm prior to scheduled cardioversion, please notify our office immediately.   If your procedure is canceled in the cardioversion suite you will be charged a cancellation fee.    Hold medication 7 days prior to scheduled procedure/anesthesia.  Restart medication on the normal dosing day after scheduled procedure/anesthesia  Dulaglutide (Trulicity) Exenatide extended release (Bydureon bcise) Semaglutide (Ozempic) (WEGOVY)  Tirzepatide (Mounjaro)     Hold medication 24 hours prior to scheduled procedure/anesthesia.   Restart medication on the following day after scheduled procedure/anesthesia   Exenatide (Byetta)  Liraglutide (Victoza, Saxenda)  Lixisenatide (Adlyxin)  Semaglutide (Rybelsus) Polyethylene Glycol Loxenatide   For those patients who have a scheduled procedure/anesthesia on the same day of the week as their dose, hold the medication on the day of surgery.  They can take their scheduled dose the week before.  **Patients on the above medications scheduled for elective procedures that have not held the medication for the appropriate amount of time are at risk of cancellation or change in the anesthetic plan.

## 2022-09-17 ENCOUNTER — Other Ambulatory Visit (HOSPITAL_COMMUNITY): Payer: Self-pay

## 2022-09-20 NOTE — Pre-Procedure Instructions (Signed)
Spoke with patient regarding procedure instructions.  Arrival time on 10:00 on Wednesday 09/30/22.  Nothing to eat or drink after midnight on Tuesday night.  Need a responsible adult to drive you home after procedure and stay with you for 24 hours.    Eliquis- she states she hasn't missed any doses, instructed her to continue to take twice a day.  She will take it the morning of procedure.  Ozempic- she takes on Sundays, ok to take today, do not take next Sunday 09/27/22.

## 2022-09-22 ENCOUNTER — Ambulatory Visit: Payer: PPO | Attending: Cardiology

## 2022-09-22 DIAGNOSIS — R931 Abnormal findings on diagnostic imaging of heart and coronary circulation: Secondary | ICD-10-CM | POA: Diagnosis not present

## 2022-09-23 LAB — ECHOCARDIOGRAM LIMITED
Area-P 1/2: 5.16 cm2
Calc EF: 45.1 %
S' Lateral: 3.4 cm
Single Plane A2C EF: 54.3 %
Single Plane A4C EF: 41.5 %

## 2022-09-24 ENCOUNTER — Ambulatory Visit: Payer: PPO | Admitting: Podiatry

## 2022-09-25 ENCOUNTER — Telehealth: Payer: Self-pay | Admitting: Cardiology

## 2022-09-25 ENCOUNTER — Other Ambulatory Visit (HOSPITAL_COMMUNITY): Payer: Self-pay | Admitting: Physician Assistant

## 2022-09-25 DIAGNOSIS — I4891 Unspecified atrial fibrillation: Secondary | ICD-10-CM | POA: Diagnosis not present

## 2022-09-25 NOTE — Telephone Encounter (Signed)
-----   Message from Jonelle Sidle, MD sent at 09/23/2022 10:23 AM EDT ----- Results reviewed.  I also reviewed the images myself.  LVEF 40 to 45% range with inferior and septal akinesis consistent with ischemic heart disease.  Patient noted to be in atrial flutter during the study and chart indicates plan for follow-up cardioversion.  Would keep follow-up given upward trend in LVEF, we may want to repeat this later once she has been back in sinus rhythm longer.

## 2022-09-25 NOTE — Telephone Encounter (Signed)
Patient informed and verbalized understanding of plan. Copy sent to PCP 

## 2022-09-25 NOTE — Telephone Encounter (Signed)
Patient is calling to receive echo results 

## 2022-09-26 LAB — CBC
Hematocrit: 43.1 % (ref 34.0–46.6)
Hemoglobin: 13.8 g/dL (ref 11.1–15.9)
MCH: 29.2 pg (ref 26.6–33.0)
MCHC: 32 g/dL (ref 31.5–35.7)
MCV: 91 fL (ref 79–97)
Platelets: 238 10*3/uL (ref 150–450)
RBC: 4.72 x10E6/uL (ref 3.77–5.28)
RDW: 15.6 % — ABNORMAL HIGH (ref 11.7–15.4)
WBC: 7.7 10*3/uL (ref 3.4–10.8)

## 2022-09-26 LAB — COMPREHENSIVE METABOLIC PANEL
ALT: 76 IU/L — ABNORMAL HIGH (ref 0–32)
AST: 32 IU/L (ref 0–40)
Albumin/Globulin Ratio: 2.2 (ref 1.2–2.2)
Albumin: 4.1 g/dL (ref 3.8–4.8)
Alkaline Phosphatase: 73 IU/L (ref 44–121)
BUN/Creatinine Ratio: 9 — ABNORMAL LOW (ref 12–28)
BUN: 14 mg/dL (ref 8–27)
Bilirubin Total: 0.6 mg/dL (ref 0.0–1.2)
CO2: 26 mmol/L (ref 20–29)
Calcium: 8.8 mg/dL (ref 8.7–10.3)
Chloride: 100 mmol/L (ref 96–106)
Creatinine, Ser: 1.51 mg/dL — ABNORMAL HIGH (ref 0.57–1.00)
Globulin, Total: 1.9 g/dL (ref 1.5–4.5)
Glucose: 117 mg/dL — ABNORMAL HIGH (ref 70–99)
Potassium: 3.6 mmol/L (ref 3.5–5.2)
Sodium: 141 mmol/L (ref 134–144)
Total Protein: 6 g/dL (ref 6.0–8.5)
eGFR: 35 mL/min/{1.73_m2} — ABNORMAL LOW (ref >59)

## 2022-09-26 LAB — TSH: TSH: 2.1 u[IU]/mL (ref 0.450–4.500)

## 2022-09-28 DIAGNOSIS — I480 Paroxysmal atrial fibrillation: Secondary | ICD-10-CM | POA: Diagnosis not present

## 2022-09-28 DIAGNOSIS — G4733 Obstructive sleep apnea (adult) (pediatric): Secondary | ICD-10-CM | POA: Diagnosis not present

## 2022-09-28 DIAGNOSIS — N2581 Secondary hyperparathyroidism of renal origin: Secondary | ICD-10-CM | POA: Diagnosis not present

## 2022-09-28 DIAGNOSIS — Z7722 Contact with and (suspected) exposure to environmental tobacco smoke (acute) (chronic): Secondary | ICD-10-CM | POA: Diagnosis not present

## 2022-09-28 DIAGNOSIS — Z7901 Long term (current) use of anticoagulants: Secondary | ICD-10-CM | POA: Diagnosis not present

## 2022-09-28 DIAGNOSIS — N1832 Chronic kidney disease, stage 3b: Secondary | ICD-10-CM | POA: Diagnosis not present

## 2022-09-28 DIAGNOSIS — D6869 Other thrombophilia: Secondary | ICD-10-CM | POA: Diagnosis not present

## 2022-09-28 DIAGNOSIS — D692 Other nonthrombocytopenic purpura: Secondary | ICD-10-CM | POA: Diagnosis not present

## 2022-09-28 DIAGNOSIS — Z6822 Body mass index (BMI) 22.0-22.9, adult: Secondary | ICD-10-CM | POA: Diagnosis not present

## 2022-09-29 NOTE — Pre-Procedure Instructions (Signed)
Spoke with patient reviewed the following instructions for cardioversion for tomorrow.  Arrival time 10:00.  Nothing to eat or drink after midnight.  You will need a responsible adult to drive you home after the procedure.  Takes Eliquis hasn't missed any doses.  Last Ozempic dose was 09/20/22

## 2022-09-30 ENCOUNTER — Ambulatory Visit (HOSPITAL_COMMUNITY): Payer: PPO | Admitting: Anesthesiology

## 2022-09-30 ENCOUNTER — Ambulatory Visit (HOSPITAL_COMMUNITY)
Admission: RE | Admit: 2022-09-30 | Discharge: 2022-09-30 | Disposition: A | Discharge status: Home / Self Care | Payer: PPO | Attending: Cardiovascular Disease | Admitting: Cardiovascular Disease

## 2022-09-30 ENCOUNTER — Ambulatory Visit (HOSPITAL_BASED_OUTPATIENT_CLINIC_OR_DEPARTMENT_OTHER): Payer: PPO | Admitting: Anesthesiology

## 2022-09-30 ENCOUNTER — Encounter (HOSPITAL_COMMUNITY)
Admission: RE | Disposition: A | Discharge status: Home / Self Care | Payer: Self-pay | Source: Home / Self Care | Attending: Cardiovascular Disease

## 2022-09-30 DIAGNOSIS — I130 Hypertensive heart and chronic kidney disease with heart failure and stage 1 through stage 4 chronic kidney disease, or unspecified chronic kidney disease: Secondary | ICD-10-CM | POA: Diagnosis not present

## 2022-09-30 DIAGNOSIS — I4819 Other persistent atrial fibrillation: Secondary | ICD-10-CM

## 2022-09-30 DIAGNOSIS — I4892 Unspecified atrial flutter: Secondary | ICD-10-CM | POA: Insufficient documentation

## 2022-09-30 DIAGNOSIS — I2510 Atherosclerotic heart disease of native coronary artery without angina pectoris: Secondary | ICD-10-CM | POA: Diagnosis not present

## 2022-09-30 DIAGNOSIS — I110 Hypertensive heart disease with heart failure: Secondary | ICD-10-CM

## 2022-09-30 DIAGNOSIS — Z79899 Other long term (current) drug therapy: Secondary | ICD-10-CM | POA: Insufficient documentation

## 2022-09-30 DIAGNOSIS — Z7985 Long-term (current) use of injectable non-insulin antidiabetic drugs: Secondary | ICD-10-CM | POA: Diagnosis not present

## 2022-09-30 DIAGNOSIS — D6869 Other thrombophilia: Secondary | ICD-10-CM | POA: Diagnosis not present

## 2022-09-30 DIAGNOSIS — Z950 Presence of cardiac pacemaker: Secondary | ICD-10-CM | POA: Insufficient documentation

## 2022-09-30 DIAGNOSIS — I252 Old myocardial infarction: Secondary | ICD-10-CM | POA: Insufficient documentation

## 2022-09-30 DIAGNOSIS — N1832 Chronic kidney disease, stage 3b: Secondary | ICD-10-CM | POA: Diagnosis not present

## 2022-09-30 DIAGNOSIS — I509 Heart failure, unspecified: Secondary | ICD-10-CM | POA: Diagnosis not present

## 2022-09-30 DIAGNOSIS — I5022 Chronic systolic (congestive) heart failure: Secondary | ICD-10-CM | POA: Diagnosis not present

## 2022-09-30 DIAGNOSIS — G4733 Obstructive sleep apnea (adult) (pediatric): Secondary | ICD-10-CM | POA: Diagnosis not present

## 2022-09-30 DIAGNOSIS — Z7901 Long term (current) use of anticoagulants: Secondary | ICD-10-CM | POA: Insufficient documentation

## 2022-09-30 DIAGNOSIS — E1122 Type 2 diabetes mellitus with diabetic chronic kidney disease: Secondary | ICD-10-CM | POA: Insufficient documentation

## 2022-09-30 DIAGNOSIS — Z794 Long term (current) use of insulin: Secondary | ICD-10-CM | POA: Insufficient documentation

## 2022-09-30 DIAGNOSIS — I484 Atypical atrial flutter: Secondary | ICD-10-CM

## 2022-09-30 DIAGNOSIS — I4891 Unspecified atrial fibrillation: Secondary | ICD-10-CM | POA: Diagnosis not present

## 2022-09-30 DIAGNOSIS — I495 Sick sinus syndrome: Secondary | ICD-10-CM | POA: Diagnosis not present

## 2022-09-30 DIAGNOSIS — G4730 Sleep apnea, unspecified: Secondary | ICD-10-CM | POA: Diagnosis not present

## 2022-09-30 DIAGNOSIS — E1151 Type 2 diabetes mellitus with diabetic peripheral angiopathy without gangrene: Secondary | ICD-10-CM | POA: Insufficient documentation

## 2022-09-30 HISTORY — PX: CARDIOVERSION: SHX1299

## 2022-09-30 SURGERY — CARDIOVERSION
Anesthesia: General

## 2022-09-30 MED ORDER — SODIUM CHLORIDE 0.9 % IV SOLN: INTRAVENOUS | Status: DC

## 2022-09-30 MED ORDER — SODIUM CHLORIDE 0.9 % IV SOLN: INTRAVENOUS | Status: DC | PRN

## 2022-09-30 MED ORDER — LIDOCAINE 2% (20 MG/ML) 5 ML SYRINGE
INTRAMUSCULAR | Status: DC | PRN
  Administered 2022-09-30: 60 mg via INTRAVENOUS

## 2022-09-30 MED ORDER — PROPOFOL 10 MG/ML IV BOLUS
INTRAVENOUS | Status: DC | PRN
  Administered 2022-09-30: 50 mg via INTRAVENOUS
  Administered 2022-09-30: 10 mg via INTRAVENOUS

## 2022-09-30 SURGICAL SUPPLY — 1 items: ELECT DEFIB PAD ADLT CADENCE (PAD) IMPLANT

## 2022-09-30 NOTE — Interval H&P Note (Signed)
History and Physical Interval Note:  09/30/2022 10:21 AM  Virginia Huber  has presented today for surgery, with the diagnosis of AFIB.  The various methods of treatment have been discussed with the patient and family. After consideration of risks, benefits and other options for treatment, the patient has consented to  Procedure(s): CARDIOVERSION (N/A) as a surgical intervention.  The patient's history has been reviewed, patient examined, no change in status, stable for surgery.  I have reviewed the patient's chart and labs.  Questions were answered to the patient's satisfaction.     Keaja Reaume

## 2022-09-30 NOTE — Transfer of Care (Signed)
Immediate Anesthesia Transfer of Care Note  Patient: Virginia Huber  Procedure(s) Performed: CARDIOVERSION  Patient Location: Cath Lab  Anesthesia Type:General  Level of Consciousness: drowsy and patient cooperative  Airway & Oxygen Therapy: Patient Spontanous Breathing  Post-op Assessment: Report given to RN, Post -op Vital signs reviewed and stable, and Patient moving all extremities X 4  Post vital signs: Reviewed and stable  Last Vitals:  Vitals Value Taken Time  BP 174/114 09/30/22 1030  Temp    Pulse 88 09/30/22 1030  Resp 22 09/30/22 1030  SpO2 98 % 09/30/22 1030    Last Pain: There were no vitals filed for this visit.       Complications: No notable events documented.

## 2022-09-30 NOTE — Anesthesia Postprocedure Evaluation (Signed)
Anesthesia Post Note  Patient: Virginia Huber  Procedure(s) Performed: CARDIOVERSION     Patient location during evaluation: PACU Anesthesia Type: General Level of consciousness: awake and alert Pain management: pain level controlled Vital Signs Assessment: post-procedure vital signs reviewed and stable Respiratory status: spontaneous breathing, nonlabored ventilation and respiratory function stable Cardiovascular status: blood pressure returned to baseline Postop Assessment: no apparent nausea or vomiting Anesthetic complications: no   No notable events documented.  Last Vitals:  Vitals:   09/30/22 1110 09/30/22 1115  BP: (!) 173/74 (!) 175/69  Pulse: 60 60  Resp: 15 14  SpO2: 98% 98%    Last Pain: There were no vitals filed for this visit.               Shanda Howells

## 2022-09-30 NOTE — Anesthesia Preprocedure Evaluation (Addendum)
Anesthesia Evaluation  Patient identified by MRN, date of birth, ID band Patient awake    Reviewed: Allergy & Precautions, NPO status , Patient's Chart, lab work & pertinent test results, reviewed documented beta blocker date and time   History of Anesthesia Complications Negative for: history of anesthetic complications  Airway Mallampati: II  TM Distance: >3 FB Neck ROM: Full    Dental no notable dental hx.    Pulmonary sleep apnea    Pulmonary exam normal        Cardiovascular hypertension, Pt. on medications and Pt. on home beta blockers + CAD (on Plavix), + Past MI, + Cardiac Stents (2017), + Peripheral Vascular Disease and +CHF  Normal cardiovascular exam+ dysrhythmias (on Eliquis and amiodarone) Atrial Fibrillation + pacemaker (Tachy-brady syndrome)   TTE 09/22/22: Anteroseptal, inferoseptal walls akinetic, inferior wall hypokinetic, EF 35-40%, moderate LVH    Neuro/Psych TIA   GI/Hepatic Neg liver ROS,GERD  ,,  Endo/Other  diabetes (on semaglutide), Insulin DependentHypothyroidism    Renal/GU Renal InsufficiencyRenal disease     Musculoskeletal  (+) Arthritis ,    Abdominal   Peds  Hematology negative hematology ROS (+)   Anesthesia Other Findings Day of surgery medications reviewed with patient.  Reproductive/Obstetrics                              Anesthesia Physical Anesthesia Plan  ASA: 4  Anesthesia Plan: General   Post-op Pain Management: Minimal or no pain anticipated   Induction: Intravenous  PONV Risk Score and Plan: 3 and Treatment may vary due to age or medical condition, Propofol infusion and TIVA  Airway Management Planned: Mask  Additional Equipment: None  Intra-op Plan:   Post-operative Plan:   Informed Consent: I have reviewed the patients History and Physical, chart, labs and discussed the procedure including the risks, benefits and alternatives for  the proposed anesthesia with the patient or authorized representative who has indicated his/her understanding and acceptance.       Plan Discussed with: CRNA  Anesthesia Plan Comments:          Anesthesia Quick Evaluation

## 2022-09-30 NOTE — Op Note (Signed)
Procedure: Electrical Cardioversion Indications:  Atrial Fibrillation  Procedure Details:  Consent: Risks of procedure as well as the alternatives and risks of each were explained to the (patient/caregiver).  Consent for procedure obtained.  Time Out: Verified patient identification, verified procedure, site/side was marked, verified correct patient position, special equipment/implants available, medications/allergies/relevent history reviewed, required imaging and test results available.  Performed  Patient placed on cardiac monitor, pulse oximetry, supplemental oxygen as necessary.  Sedation given:  Propofol IV, Anesthesiology Pacer pads placed anterior and posterior chest.  Cardioverted 1 time(s).  Cardioversion with synchronized biphasic 120J shock.  Evaluation: Findings: Post procedure EKG shows:  A paced, V sensed rhythm Complications: None Patient did tolerate procedure well.  Comprehensive pacemaker interrogation before and after the procedure shows normal device function.  Time Spent Directly with the Patient:  45 minutes   Leanza Shepperson 09/30/2022, 10:22 AM

## 2022-10-01 ENCOUNTER — Encounter (HOSPITAL_COMMUNITY): Payer: Self-pay | Admitting: Cardiovascular Disease

## 2022-10-04 ENCOUNTER — Other Ambulatory Visit: Payer: Self-pay | Admitting: Internal Medicine

## 2022-10-04 DIAGNOSIS — E1159 Type 2 diabetes mellitus with other circulatory complications: Secondary | ICD-10-CM

## 2022-10-07 ENCOUNTER — Telehealth: Payer: Self-pay

## 2022-10-07 ENCOUNTER — Encounter: Payer: Self-pay | Admitting: Internal Medicine

## 2022-10-07 ENCOUNTER — Ambulatory Visit (INDEPENDENT_AMBULATORY_CARE_PROVIDER_SITE_OTHER): Payer: PPO

## 2022-10-07 DIAGNOSIS — I480 Paroxysmal atrial fibrillation: Secondary | ICD-10-CM | POA: Diagnosis not present

## 2022-10-07 DIAGNOSIS — I214 Non-ST elevation (NSTEMI) myocardial infarction: Secondary | ICD-10-CM

## 2022-10-07 DIAGNOSIS — E113411 Type 2 diabetes mellitus with severe nonproliferative diabetic retinopathy with macular edema, right eye: Secondary | ICD-10-CM | POA: Diagnosis not present

## 2022-10-07 DIAGNOSIS — H34831 Tributary (branch) retinal vein occlusion, right eye, with macular edema: Secondary | ICD-10-CM | POA: Diagnosis not present

## 2022-10-07 DIAGNOSIS — H353124 Nonexudative age-related macular degeneration, left eye, advanced atrophic with subfoveal involvement: Secondary | ICD-10-CM | POA: Diagnosis not present

## 2022-10-07 DIAGNOSIS — H353111 Nonexudative age-related macular degeneration, right eye, early dry stage: Secondary | ICD-10-CM | POA: Diagnosis not present

## 2022-10-07 DIAGNOSIS — H348122 Central retinal vein occlusion, left eye, stable: Secondary | ICD-10-CM | POA: Diagnosis not present

## 2022-10-07 DIAGNOSIS — H35041 Retinal micro-aneurysms, unspecified, right eye: Secondary | ICD-10-CM | POA: Diagnosis not present

## 2022-10-07 LAB — CUP PACEART REMOTE DEVICE CHECK
Battery Remaining Longevity: 32 mo
Battery Voltage: 2.97 V
Brady Statistic AP VP Percent: 0.12 %
Brady Statistic AP VS Percent: 17.64 %
Brady Statistic AS VP Percent: 9.64 %
Brady Statistic AS VS Percent: 72.6 %
Brady Statistic RA Percent Paced: 14.52 %
Brady Statistic RV Percent Paced: 9.17 %
Date Time Interrogation Session: 20240417064632
Implantable Lead Connection Status: 753985
Implantable Lead Connection Status: 753985
Implantable Lead Implant Date: 20180104
Implantable Lead Implant Date: 20180104
Implantable Lead Location: 753859
Implantable Lead Location: 753860
Implantable Lead Model: 5076
Implantable Lead Model: 5076
Implantable Pulse Generator Implant Date: 20180104
Lead Channel Impedance Value: 304 Ohm
Lead Channel Impedance Value: 342 Ohm
Lead Channel Impedance Value: 380 Ohm
Lead Channel Impedance Value: 418 Ohm
Lead Channel Pacing Threshold Amplitude: 0.75 V
Lead Channel Pacing Threshold Amplitude: 1.375 V
Lead Channel Pacing Threshold Pulse Width: 0.4 ms
Lead Channel Pacing Threshold Pulse Width: 0.4 ms
Lead Channel Sensing Intrinsic Amplitude: 15.375 mV
Lead Channel Sensing Intrinsic Amplitude: 15.375 mV
Lead Channel Sensing Intrinsic Amplitude: 2.5 mV
Lead Channel Sensing Intrinsic Amplitude: 2.5 mV
Lead Channel Setting Pacing Amplitude: 2 V
Lead Channel Setting Pacing Amplitude: 2.75 V
Lead Channel Setting Pacing Pulse Width: 0.4 ms
Lead Channel Setting Sensing Sensitivity: 2.8 mV
Zone Setting Status: 755011
Zone Setting Status: 755011

## 2022-10-07 LAB — HM DIABETES EYE EXAM

## 2022-10-07 NOTE — Telephone Encounter (Signed)
Following alert received from [employer] received for presenting rhythm AF/AFL ongoing from 4/13, DCCV 4/10, ablation 2/23 - route for ongoing arrhythmia post DCCV. Burden 63.6%, Eliquis per EPIC.  Patient has apt in AF clinic 10/08/22.

## 2022-10-08 ENCOUNTER — Encounter (HOSPITAL_COMMUNITY): Payer: Self-pay | Admitting: Physician Assistant

## 2022-10-08 ENCOUNTER — Ambulatory Visit (HOSPITAL_COMMUNITY)
Admission: RE | Admit: 2022-10-08 | Discharge: 2022-10-08 | Disposition: A | Discharge status: Home / Self Care | Payer: PPO | Source: Ambulatory Visit | Attending: Physician Assistant | Admitting: Physician Assistant

## 2022-10-08 VITALS — BP 144/90 | HR 77 | Ht 63.0 in | Wt 127.8 lb

## 2022-10-08 DIAGNOSIS — I4819 Other persistent atrial fibrillation: Secondary | ICD-10-CM

## 2022-10-08 DIAGNOSIS — I2510 Atherosclerotic heart disease of native coronary artery without angina pectoris: Secondary | ICD-10-CM | POA: Insufficient documentation

## 2022-10-08 DIAGNOSIS — I5022 Chronic systolic (congestive) heart failure: Secondary | ICD-10-CM | POA: Insufficient documentation

## 2022-10-08 DIAGNOSIS — Z7901 Long term (current) use of anticoagulants: Secondary | ICD-10-CM | POA: Insufficient documentation

## 2022-10-08 DIAGNOSIS — I484 Atypical atrial flutter: Secondary | ICD-10-CM

## 2022-10-08 DIAGNOSIS — Z5181 Encounter for therapeutic drug level monitoring: Secondary | ICD-10-CM

## 2022-10-08 DIAGNOSIS — G4733 Obstructive sleep apnea (adult) (pediatric): Secondary | ICD-10-CM | POA: Insufficient documentation

## 2022-10-08 DIAGNOSIS — R5383 Other fatigue: Secondary | ICD-10-CM | POA: Insufficient documentation

## 2022-10-08 DIAGNOSIS — I495 Sick sinus syndrome: Secondary | ICD-10-CM | POA: Diagnosis not present

## 2022-10-08 DIAGNOSIS — Z79899 Other long term (current) drug therapy: Secondary | ICD-10-CM | POA: Diagnosis not present

## 2022-10-08 DIAGNOSIS — D6869 Other thrombophilia: Secondary | ICD-10-CM

## 2022-10-08 MED ORDER — AMIODARONE HCL 200 MG PO TABS: ORAL_TABLET | ORAL | 0 refills | Status: DC

## 2022-10-08 NOTE — Progress Notes (Signed)
Primary Care Physician: Assunta Found, MD Primary Cardiologist: Dr Diona Browner  Primary Electrophysiologist: Dr Elberta Fortis  Referring Physician: Dr Alinda Dooms is a 77 y.o. female with a history of CAD, PAD, HFrEF, HTN, SSS s/p PPM, OSA, atrial flutter, atrial fibrillation who presents for follow up in the [facility]. She was hospitalized December 2017 with atrial fibrillation rapid rates. She was cardioverted to junctional rhythm. She is now status post Medtronic dual-chamber pacemaker. She has been maintained on amiodarone but has continued to have episodes of rapid afib requiring hospitalization. She is now s/p afib and flutter ablation with Dr Elberta Fortis. Patient is on Eliquis for a CHADS2VASC score of 6. She did have some oozing from her groin sites and was kept two additional nights post ablation.   On follow up today, patient is s/p DCCV on 09/30/22. Unfortunately, her PPM alerted that she was back out of rhythm 3 days later. She is fatigued, especially with exertion. No bleeding issues on anticoagulation. Of note, her daughter was just diagnosed with lung cancer.   Today, she denies symptoms of palpitations, chest pain, shortness of breath, orthopnea, PND, lower extremity edema, dizziness, presyncope, syncope, bleeding, or neurologic sequela. The patient is tolerating medications without difficulties and is otherwise without complaint today.    Atrial Fibrillation Risk Factors:  she does have symptoms or diagnosis of sleep apnea. she is compliant with CPAP therapy. she does not have a history of rheumatic fever.   she has a BMI of Body mass index is 22.64 kg/m.[name] Filed Weights   10/08/22 1351  Weight: 58 kg    Family History  Problem Relation Age of Onset   Heart disease Mother        deceased   Heart disease Father        deceased, heart disease   Diabetes Brother    Heart disease Brother    Thyroid disease Brother    Heart disease  Sister    Heart disease Brother    Thyroid disease Brother    Lupus Daughter    Colon cancer Neg Hx      Atrial Fibrillation Management history:  Previous antiarrhythmic drugs: amiodarone  Previous cardioversions: 2019, 05/12/22, 09/30/22 Previous ablations: 08/14/22 CHADS2VASC score: 6 Anticoagulation history: Eliquis   Past Medical History:  Diagnosis Date   (HFpEF) heart failure with preserved ejection fraction    Advanced nonexudative age-related macular degeneration of left eye with subfoveal involvement 06/26/2020   Ongoing, accounts for acuity   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 h/o PAF/flutter during admission in 2013 for PNA. b. PAF during adm for NSTEMI 07/2015, subsequent paroxysms since then.   B12 deficiency anemia    Branch retinal vein occlusion with macular edema of right eye 10/11/2019   Chronic renal failure, stage 3b 09/23/2017   Coronary artery disease 11/30/2014   a. remote MI. b. h/o PTCA with scoring balloon to OM1 11/2014. c. NSTEMI 03/2015 s/p DES to prox-mid Cx. d. NSTEMI 07/2015 s/p scoring balloon/PTCA/DES to dRCA with PAF during that admission   Cutaneous lupus erythematosus    Early stage nonexudative age-related macular degeneration of right eye 02/27/2020   Essential hypertension    GERD (gastroesophageal reflux disease)    History of blood transfusion 1980's   2nd surgical procedures   Hypercholesteremia    Hypothyroidism    Myocardial infarction 02/2012   NSTEMI (non-ST elevated  myocardial infarction) 04/02/2015   OSA (obstructive sleep apnea) 05/13/2016   Ovarian tumor    PAD (peripheral artery disease)    a. s/p LE angio 2015; followed by Dr. Kirke Corin - managed medically.   Pericardial effusion    a. 06/2016 after ppm - s/p pericardiocentesis.   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 s/p Medtronic PPM 06/2016, c/b lead perf/pericardial effusion.   TIA (transient ischemic attack)    Type 2 diabetes with nephropathy 02/29/2012   Past Surgical History:  Procedure Laterality Date   ABDOMINAL AORTAGRAM N/A 01/03/2014   Procedure: ABDOMINAL Ronny Flurry;  Surgeon: Iran Ouch, MD;  Location: [facility] CATH LAB;  Service: Cardiovascular;  Laterality: N/A;   ABDOMINAL HYSTERECTOMY  06/22/1970   "partial"   APPENDECTOMY  02/20/1969   ATRIAL FIBRILLATION ABLATION N/A 08/14/2022   Procedure: ATRIAL FIBRILLATION ABLATION;  Surgeon: Regan Lemming, MD;  Location: [facility] INVASIVE CV LAB;  Service: Cardiovascular;  Laterality: N/A;   CARDIAC CATHETERIZATION  06/22/2006   Tiny OM-2 with 90% narrowing. Med tx.   CARDIAC CATHETERIZATION N/A 11/30/2014   Procedure: Left Heart Cath and Coronary Angiography;  Surgeon: Lennette Bihari, MD; LAD 20%, CFX 50%, OM1 95%, right PLB 30%, LV normal    CARDIAC CATHETERIZATION N/A 11/30/2014   Procedure: Coronary Balloon Angioplasty;  Surgeon: Lennette Bihari, MD;  Angiosculpt scoring balloon and PTCA to the OM1 reducing stenosis from 95% to less than 10%   CARDIAC CATHETERIZATION N/A 04/03/2015   Procedure: Left Heart Cath and Coronary Angiography;  Surgeon: Dolores Patty, MD; dLAD 50%, CFX 90%, OM1 100%, PLA 15%, LVEDP 13     CARDIAC CATHETERIZATION N/A 04/03/2015   Procedure: Coronary Stent Intervention;  Surgeon: Tonny Bollman, MD; 3.0x18 mm Xience DES to the CFX     CARDIAC CATHETERIZATION N/A 08/02/2015   Procedure: Left Heart Cath and Coronary Angiography;  Surgeon: Lennette Bihari, MD;  Location: [facility] INVASIVE CV LAB;  Service: Cardiovascular;  Laterality: N/A;   CARDIAC CATHETERIZATION  N/A 08/02/2015   Procedure: Coronary Stent Intervention;  Surgeon: Lennette Bihari, MD;  Location: [facility] INVASIVE CV LAB;  Service: Cardiovascular;  Laterality: N/A;   CARDIAC CATHETERIZATION N/A 06/25/2016   Procedure: Pericardiocentesis;  Surgeon: Will Jorja Loa, MD;  Location: [facility] INVASIVE CV LAB;  Service: Cardiovascular;  Laterality: N/A;   cardiac stents     CARDIOVERSION N/A 12/15/2017   Procedure: CARDIOVERSION;  Surgeon: Elease Hashimoto Deloris Ping, MD;  Location: [facility] ENDOSCOPY;  Service: Cardiovascular;  Laterality: N/A;   CARDIOVERSION N/A 05/12/2022   Procedure: CARDIOVERSION;  Surgeon: Quintella Reichert, MD;  Location: [facility] ENDOSCOPY;  Service: Cardiovascular;  Laterality: N/A;   CARDIOVERSION N/A 09/30/2022   Procedure: CARDIOVERSION;  Surgeon: Thurmon Fair, MD;  Location: [facility] INVASIVE CV LAB;  Service: Cardiovascular;  Laterality: N/A;   CHOLECYSTECTOMY OPEN  02/20/1989   COLONOSCOPY  06/23/2003   Dr. Karilyn Cota: pancolonic divericula, polyp, path unknown currently   COLONOSCOPY  06/22/2010   Dr. Darrick Penna: Normal TI, scattered diverticula in entire colon, small internal hemorrhoids, normal colon biopsies. Colonoscopy in 5-10 years.    COLONOSCOPY WITH PROPOFOL N/A 06/02/2021   pancolonic diverticulosis. Two  4-6 mm polyps in transverse colon. Sessile serrated and hyperplastic. 5 year surveillance if benefits outweigh the risks.   COLOSTOMY  05/23/1979   COLOSTOMY REVERSAL  11/21/1979   EP IMPLANTABLE DEVICE N/A 06/25/2016   Procedure: Lead Revision/Repair;  Surgeon: Will Jorja Loa, MD;  Location: [facility] INVASIVE CV LAB;  Service: Cardiovascular;  Laterality: N/A;   EP IMPLANTABLE DEVICE N/A 06/25/2016   Procedure: Pacemaker Implant;  Surgeon: Will Jorja Loa, MD;  Location: [facility] INVASIVE CV LAB;  Service: Cardiovascular;  Laterality: N/A;   EXCISIONAL HEMORRHOIDECTOMY  02/20/1969   EYE SURGERY Left 06/22/1998   "branch vein occlusion"   EYE SURGERY Left ~ 2001   "smoothed out wrinkle"   LEFT  HEART CATH AND CORONARY ANGIOGRAPHY N/A 05/11/2022   Procedure: LEFT HEART CATH AND CORONARY ANGIOGRAPHY;  Surgeon: Kathleene Hazel, MD;  Location: [facility] INVASIVE CV LAB;  Service: Cardiovascular;  Laterality: N/A;   LEFT OOPHORECTOMY  05/23/1979   nicked bowel, peritonitis, colostomy; colostomy reversed 1981    LOWER EXTREMITY ANGIOGRAM N/A 01/03/2014   Procedure: LOWER EXTREMITY ANGIOGRAM;  Surgeon: Iran Ouch, MD;  Location: [facility] CATH LAB;  Service: Cardiovascular;  Laterality: N/A;   Nuclear med stress test  10/21/2011   Small area of mild ischemia inferoapically.   PARTIAL HYSTERECTOMY  02/20/1969   left ovaries, then ovaries removed later due tumors    POLYPECTOMY  06/02/2021   Procedure: POLYPECTOMY;  Surgeon: Lanelle Bal, DO;  Location: [facility] ENDO [facility];  Service: Endoscopy;;   right eye surgery  03/2021   RIGHT OOPHORECTOMY  02/20/1969   TEE WITHOUT CARDIOVERSION N/A 05/12/2022   Procedure: TRANSESOPHAGEAL ECHOCARDIOGRAM (TEE);  Surgeon: Quintella Reichert, MD;  Location: [facility] ENDOSCOPY;  Service: Cardiovascular;  Laterality: N/A;   TEE WITHOUT CARDIOVERSION N/A 08/14/2022   Procedure: TRANSESOPHAGEAL ECHOCARDIOGRAM;  Surgeon: Regan Lemming, MD;  Location: [facility] INVASIVE CV LAB;  Service: Cardiovascular;  Laterality: N/A;    Current Outpatient Medications  Medication Sig Dispense Refill   acetaminophen (TYLENOL) 500 MG tablet Take 1,000 mg by mouth every 8 (eight) hours as needed for headache, moderate pain or mild pain.     amiodarone (PACERONE) 200 MG tablet Take 1 tablet (200 mg total) by mouth daily. 30 tablet 6   apixaban (ELIQUIS) 5 MG TABS tablet TAKE ONE TABLET BY MOUTH TWICE DAILY 60 tablet 5   atorvastatin (LIPITOR) 80 MG tablet TAKE ONE TABLET BY MOUTH EVERY DAY AT 6:00PM (Patient taking differently: Take 30 mg by mouth daily.) 90 tablet 3   calcitRIOL (ROCALTROL) 0.25 MCG capsule Take 0.25 mcg by mouth every Monday, Wednesday, and Friday.     carvedilol  (COREG) 25 MG tablet Take 1 tablet (25 mg total) by mouth 2 (two) times daily. 60 tablet 6   Cholecalciferol 50 MCG (2000 UT) TABS Take 4,000 Units by mouth at bedtime.     clopidogrel (PLAVIX) 75 MG tablet TAKE ONE TABLET (75MG  TOTAL) BY MOUTH DAILY 90 tablet 3   Continuous Blood Gluc Receiver (FREESTYLE LIBRE 2 READER) DEVI 1 each by Does not apply route daily. 1 each 0   Continuous Blood Gluc Sensor (FREESTYLE LIBRE 2 SENSOR) MISC USE ONE FOR FOURTEEN DAYS 6 each 3   furosemide (LASIX) 40 MG tablet Take 40 mg by mouth daily as needed for fluid or edema.     glucose blood (FREESTYLE PRECISION NEO TEST) test strip Use as instructed 100 each 3   Insulin NPH, Human,, Isophane, (NOVOLIN N FLEXPEN) 100 UNIT/ML [employer]  20 units in am and 8-10 units before dinner 30 mL 2   levothyroxine (SYNTHROID) 112 MCG tablet TAKE ONE TABLET BY MOUTH ONCE DAILY 90 tablet 2   nitroGLYCERIN (NITROSTAT) 0.4 MG SL tablet Place 1 tablet (0.4 mg total) under the tongue every 5 (five) minutes x 3 doses as needed for chest pain (If not relief after 3rd dose, call 911 or go to ED). 25 tablet 0   rOPINIRole (REQUIP) 0.5 MG tablet Take 0.5 mg by mouth at bedtime.   0   Semaglutide, 2 MG/DOSE, 8 MG/3ML SOPN Inject 2 mg as directed once a week. (Patient taking differently: Inject 2 mg as directed once a week. Takes on Sunday) 9 mL 3   No current facility-administered medications for this encounter.    Allergies  Allergen Reactions   Penicillins Hives    Has patient had a PCN reaction causing immediate rash, facial/tongue/throat swelling, SOB or lightheadedness with hypotension: Yes Has patient had a PCN reaction causing severe rash involving mucus membranes or skin necrosis: No Has patient had a PCN reaction that required hospitalization No Has patient had a PCN reaction occurring within the last 10 years: No If all of the above answers are "NO", then may proceed with Cephalosporin use.   Percocet [Oxycodone-Acetaminophen]  Nausea And Vomiting    Social History   Socioeconomic History   Marital status: Married    Spouse name: Not on file   Number of children: Not on file   Years of education: Not on file   Highest education level: Not on file  Occupational History   Occupation: Retired    Associate Professor: RETIRED    Comment: insurance billing  Tobacco Use   Smoking status: Never    Passive exposure: Never   Smokeless tobacco: Never   Tobacco comments:    Never smoke 10/08/22  Vaping Use   Vaping Use: Never used  Substance and Sexual Activity   Alcohol use: No    Alcohol/week: 0.0 standard drinks of alcohol   Drug use: No   Sexual activity: Never    Birth control/protection: Surgical    Comment: hyst  Other Topics Concern   Not on file  Social History Narrative   Not on file   Social Determinants of Health   Financial Resource Strain: Not on file  Food Insecurity: No Food Insecurity (05/10/2022)   Hunger Vital Sign    Worried About Running Out of Food in the Last Year: Never true    Ran Out of Food in the Last Year: Never true  Transportation Needs: No Transportation Needs (05/10/2022)   PRAPARE - Administrator, Civil Service (Medical): No    Lack of Transportation (Non-Medical): No  Physical Activity: Not on file  Stress: Not on file  Social Connections: Not on file  Intimate Partner Violence: Not At Risk (05/10/2022)   Humiliation, Afraid, Rape, and Kick questionnaire    Fear of Current or Ex-Partner: No    Emotionally Abused: No    Physically Abused: No    Sexually Abused: No     ROS- All systems are reviewed and negative except as per the HPI above.  Physical Exam: Vitals:   10/08/22 1351  BP: (!) 144/90  Pulse: 77  Weight: 58 kg  Height:  (1.6 m)     GEN- The patient is a well appearing elderly female, alert and oriented x 3 today.   HEENT-head normocephalic, atraumatic, sclera clear, conjunctiva pink, hearing intact, trachea midline.  Lungs- Clear to  ausculation bilaterally, normal work of breathing Heart- irregular rate and rhythm, no murmurs, rubs or gallops  GI- soft, NT, ND, + BS Extremities- no clubbing, cyanosis, or edema MS- no significant deformity or atrophy Skin- no rash or lesion Psych- euthymic mood, full affect Neuro- strength and sensation are intact   Wt Readings from Last 3 Encounters:  10/08/22 58 kg  09/14/22 58.7 kg  08/31/22 58.3 kg    EKG today demonstrates  V pacing with underlying atrial flutter Vent. rate 77 BPM PR interval 160 ms QRS duration 140 ms QT/QTcB 458/518 ms  TEE 08/14/22 demonstrated   1. Left ventricular ejection fraction, by estimation, is 30 to 35%. The  left ventricle has moderately decreased function. The left ventricle  demonstrates global hypokinesis.   2. Right ventricular systolic function is mildly reduced. The right  ventricular size is normal.   3. Left atrial size was mildly dilated. No left atrial/left atrial  appendage thrombus was detected.   4. The mitral valve is normal in structure. Mild mitral valve  regurgitation. No evidence of mitral stenosis.   5. The aortic valve is tricuspid. Aortic valve regurgitation is not  visualized. No aortic stenosis is present.   6. The inferior vena cava is normal in size with greater than 50%  respiratory variability, suggesting right atrial pressure of 3 mmHg.   Epic records are reviewed at length today  CHA2DS2-VASc Score = 6  The patient's score is based upon: CHF History: 1 HTN History: 1 Diabetes History: 0 Stroke History: 0 Vascular Disease History: 1 Age Score: 2 Gender Score: 1       ASSESSMENT AND PLAN: 1. Persistent Atrial Fibrillation/atrial flutter The patient's CHA2DS2-VASc score is 6, indicating a 9.7% annual risk of stroke.   S/p afib and flutter ablation 08/14/22 S/p DCCV 09/30/22 with quick return of atrial flutter. We discussed rhythm control options again today. Will increase amiodarone to 200 mg BID  and attempt DCCV one more time. Decrease back to 200 mg daily post DCCV.  Continue Eliquis 5 mg BID with no missed doses for 3 months post ablation.  Continue carvedilol 25 mg BID  2. Secondary Hypercoagulable State (ICD10:  D68.69) The patient is at significant risk for stroke/thromboembolism based upon her CHA2DS2-VASc Score of 6.  Continue Apixaban (Eliquis).   3. Chronic systolic CHF EF improved to 40-45% Fluid status appears stable today.  4. Obstructive sleep apnea Encouraged compliance with CPAP therapy.  5. SSS S/p PPM, followed by Dr Elberta Fortis and the device clinic.  6. CAD No anginal symptoms.   Follow up with Dr Elberta Fortis as scheduled.    Jorja Loa PA-C Afib Clinic [facility] [address] [place], [place] [address] [phone] 10/08/2022 2:19 PM

## 2022-10-08 NOTE — Patient Instructions (Addendum)
Amiodarone 200mg  twice a day for 3 weeks then reduce to once a day   Have labs drawn at [facility] anytime between May 1st-6th   Cardioversion scheduled for: Thursday, May 9th   - Arrive at the [facility] and go to admitting at 10am   - Do not eat or drink anything after midnight the night prior to your procedure.   - Take all your morning medication (except diabetic medications) with a sip of water prior to arrival. NO OZEMPIC ON THE SUNDAY BEFORE YOUR PROCEDURE  - You will not be able to drive home after your procedure.    - Do NOT miss any doses of your blood thinner - if you should miss a dose please notify our office immediately.   - If you feel as if you go back into normal rhythm prior to scheduled cardioversion, please notify our office immediately.   If your procedure is canceled in the cardioversion suite you will be charged a cancellation fee.    Hold medication 7 days prior to scheduled procedure/anesthesia.  Restart medication on the normal dosing day after scheduled procedure/anesthesia  Dulaglutide (Trulicity) Exenatide extended release (Bydureon bcise) Semaglutide (Ozempic) (WEGOVY)  Tirzepatide (Mounjaro)     Hold medication 24 hours prior to scheduled procedure/anesthesia.   Restart medication on the following day after scheduled procedure/anesthesia   Exenatide (Byetta)  Liraglutide (Victoza, Saxenda)  Lixisenatide (Adlyxin)  Semaglutide (Rybelsus) Polyethylene Glycol Loxenatide   For those patients who have a scheduled procedure/anesthesia on the same day of the week as their dose, hold the medication on the day of surgery.  They can take their scheduled dose the week before.  **Patients on the above medications scheduled for elective procedures that have not held the medication for the appropriate amount of time are at risk of cancellation or change in the anesthetic plan.

## 2022-10-08 NOTE — H&P (View-Only) (Signed)
  Primary Care Physician: Golding, John, MD Primary Cardiologist: Dr McDowell  Primary Electrophysiologist: Dr Camnitz  Referring Physician: Dr Camnitz    Virginia Huber is a 77 y.o. female with a history of CAD, PAD, HFrEF, HTN, SSS s/p PPM, OSA, atrial flutter, atrial fibrillation who presents for follow up in the  [facility]. She was hospitalized December 2017 with atrial fibrillation rapid rates. She was cardioverted to junctional rhythm. She is now status post Medtronic dual-chamber pacemaker. She has been maintained on amiodarone but has continued to have episodes of rapid afib requiring hospitalization. She is now s/p afib and flutter ablation with Dr Camnitz. Patient is on Eliquis for a CHADS2VASC score of 6. She did have some oozing from her groin sites and was kept two additional nights post ablation.   On follow up today, patient is s/p DCCV on 09/30/22. Unfortunately, her PPM alerted that she was back out of rhythm 3 days later. She is fatigued, especially with exertion. No bleeding issues on anticoagulation. Of note, her daughter was just diagnosed with lung cancer.   Today, she denies symptoms of palpitations, chest pain, shortness of breath, orthopnea, PND, lower extremity edema, dizziness, presyncope, syncope, bleeding, or neurologic sequela. The patient is tolerating medications without difficulties and is otherwise without complaint today.    Atrial Fibrillation Risk Factors:  she does have symptoms or diagnosis of sleep apnea. she is compliant with CPAP therapy. she does not have a history of rheumatic fever.   she has a BMI of Body mass index is 22.64 kg/m.. Filed Weights   10/08/22 1351  Weight: 58 kg    Family History  Problem Relation Age of Onset   Heart disease Mother        deceased   Heart disease Father        deceased, heart disease   Diabetes Brother    Heart disease Brother    Thyroid disease Brother    Heart disease  Sister    Heart disease Brother    Thyroid disease Brother    Lupus Daughter    Colon cancer Neg Hx      Atrial Fibrillation Management history:  Previous antiarrhythmic drugs: amiodarone  Previous cardioversions: 2019, 05/12/22, 09/30/22 Previous ablations: 08/14/22 CHADS2VASC score: 6 Anticoagulation history: Eliquis   Past Medical History:  Diagnosis Date   (HFpEF) heart failure with preserved ejection fraction    Advanced nonexudative age-related macular degeneration of left eye with subfoveal involvement 06/26/2020   Ongoing, accounts for acuity   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 h/o PAF/flutter during admission in 2013 for PNA. b. PAF during adm for NSTEMI 07/2015, subsequent paroxysms since then.   B12 deficiency anemia    Branch retinal vein occlusion with macular edema of right eye 10/11/2019   Chronic renal failure, stage 3b 09/23/2017   Coronary artery disease 11/30/2014   a. remote MI. b. h/o PTCA with scoring balloon to OM1 11/2014. c. NSTEMI 03/2015 s/p DES to prox-mid Cx. d. NSTEMI 07/2015 s/p scoring balloon/PTCA/DES to dRCA with PAF during that admission   Cutaneous lupus erythematosus    Early stage nonexudative age-related macular degeneration of right eye 02/27/2020   Essential hypertension    GERD (gastroesophageal reflux disease)    History of blood transfusion 1980's   2nd surgical procedures   Hypercholesteremia    Hypothyroidism    Myocardial infarction 02/2012   NSTEMI (non-ST elevated   myocardial infarction) 04/02/2015   OSA (obstructive sleep apnea) 05/13/2016   Ovarian tumor    PAD (peripheral artery disease)    a. s/p LE angio 2015; followed by Dr. Arida - managed medically.   Pericardial effusion    a. 06/2016 after ppm - s/p pericardiocentesis.   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 s/p Medtronic PPM 06/2016, c/b lead perf/pericardial effusion.   TIA (transient ischemic attack)    Type 2 diabetes with nephropathy 02/29/2012   Past Surgical History:  Procedure Laterality Date   ABDOMINAL AORTAGRAM N/A 01/03/2014   Procedure: ABDOMINAL AORTAGRAM;  Surgeon: Muhammad A Arida, MD;  Location: [facility];  Service: Cardiovascular;  Laterality: N/A;   ABDOMINAL HYSTERECTOMY  06/22/1970   "partial"   APPENDECTOMY  02/20/1969   ATRIAL FIBRILLATION ABLATION N/A 08/14/2022   Procedure: ATRIAL FIBRILLATION ABLATION;  Surgeon: Camnitz, Will Martin, MD;  Location: [facility];  Service: Cardiovascular;  Laterality: N/A;   CARDIAC CATHETERIZATION  06/22/2006   Tiny OM-2 with 90% narrowing. Med tx.   CARDIAC CATHETERIZATION N/A 11/30/2014   Procedure: Left Heart Cath and Coronary Angiography;  Surgeon: Thomas A Kelly, MD; LAD 20%, CFX 50%, OM1 95%, right PLB 30%, LV normal    CARDIAC CATHETERIZATION N/A 11/30/2014   Procedure: Coronary Balloon Angioplasty;  Surgeon: Thomas A Kelly, MD;  Angiosculpt scoring balloon and PTCA to the OM1 reducing stenosis from 95% to less than 10%   CARDIAC CATHETERIZATION N/A 04/03/2015   Procedure: Left Heart Cath and Coronary Angiography;  Surgeon: Daniel R Bensimhon, MD; dLAD 50%, CFX 90%, OM1 100%, PLA 15%, LVEDP 13     CARDIAC CATHETERIZATION N/A 04/03/2015   Procedure: Coronary Stent Intervention;  Surgeon: Michael Cooper, MD; 3.0x18 mm Xience DES to the CFX     CARDIAC CATHETERIZATION N/A 08/02/2015   Procedure: Left Heart Cath and Coronary Angiography;  Surgeon: Thomas A Kelly, MD;  [facility]: [facility];  Service: Cardiovascular;  Laterality: N/A;   CARDIAC CATHETERIZATION  N/A 08/02/2015   Procedure: Coronary Stent Intervention;  Surgeon: Thomas A Kelly, MD;  Location: [facility];  Service: Cardiovascular;  Laterality: N/A;   CARDIAC CATHETERIZATION N/A 06/25/2016   Procedure: Pericardiocentesis;  Surgeon: Will Martin Camnitz, MD;  Location: [facility];  Service: Cardiovascular;  Laterality: N/A;   cardiac stents     CARDIOVERSION N/A 12/15/2017   Procedure: CARDIOVERSION;  Surgeon: Nahser, Philip J, MD;  Location: [facility];  Service: Cardiovascular;  Laterality: N/A;   CARDIOVERSION N/A 05/12/2022   Procedure: CARDIOVERSION;  Surgeon: Turner, Traci R, MD;  Location: [facility];  Service: Cardiovascular;  Laterality: N/A;   CARDIOVERSION N/A 09/30/2022   Procedure: CARDIOVERSION;  Surgeon: Croitoru, Mihai, MD;  Location: [facility];  Service: Cardiovascular;  Laterality: N/A;   CHOLECYSTECTOMY OPEN  02/20/1989   COLONOSCOPY  06/23/2003   Dr. Rehman: pancolonic divericula, polyp, path unknown currently   COLONOSCOPY  06/22/2010   Dr. Fields: Normal TI, scattered diverticula in entire colon, small internal hemorrhoids, normal colon biopsies. Colonoscopy in 5-10 years.    COLONOSCOPY WITH PROPOFOL N/A 06/02/2021   pancolonic diverticulosis. Two   4-6 mm polyps in transverse colon. Sessile serrated and hyperplastic. 5 year surveillance if benefits outweigh the risks.   COLOSTOMY  05/23/1979   COLOSTOMY REVERSAL  11/21/1979   EP IMPLANTABLE DEVICE N/A 06/25/2016   Procedure: Lead Revision/Repair;  Surgeon: Will Martin Camnitz, MD;  [facility];  Service: Cardiovascular;  Laterality: N/A;   EP IMPLANTABLE DEVICE N/A 06/25/2016   Procedure: Pacemaker Implant;  Surgeon: Will Martin Camnitz, MD;  [facility]: [facility];  Service: Cardiovascular;  Laterality: N/A;   EXCISIONAL HEMORRHOIDECTOMY  02/20/1969   EYE SURGERY Left 06/22/1998   "branch vein occlusion"   EYE SURGERY Left ~ 2001   "smoothed out wrinkle"   LEFT  HEART CATH AND CORONARY ANGIOGRAPHY N/A 05/11/2022   Procedure: LEFT HEART CATH AND CORONARY ANGIOGRAPHY;  Surgeon: McAlhany, Christopher D, MD;  Location: [facility];  Service: Cardiovascular;  Laterality: N/A;   LEFT OOPHORECTOMY  05/23/1979   nicked bowel, peritonitis, colostomy; colostomy reversed 1981    LOWER EXTREMITY ANGIOGRAM N/A 01/03/2014   Procedure: LOWER EXTREMITY ANGIOGRAM;  Surgeon: Muhammad A Arida, MD;  Location: [facility];  Service: Cardiovascular;  Laterality: N/A;   Nuclear med stress test  10/21/2011   Small area of mild ischemia inferoapically.   PARTIAL HYSTERECTOMY  02/20/1969   left ovaries, then ovaries removed later due tumors    POLYPECTOMY  06/02/2021   Procedure: POLYPECTOMY;  Surgeon: Carver, Charles K, DO;  Location: [facility];  Service: Endoscopy;;   right eye surgery  03/2021   RIGHT OOPHORECTOMY  02/20/1969   TEE WITHOUT CARDIOVERSION N/A 05/12/2022   Procedure: TRANSESOPHAGEAL ECHOCARDIOGRAM (TEE);  Surgeon: Turner, Traci R, MD;  Location: [facility];  Service: Cardiovascular;  Laterality: N/A;   TEE WITHOUT CARDIOVERSION N/A 08/14/2022   Procedure: TRANSESOPHAGEAL ECHOCARDIOGRAM;  Surgeon: Camnitz, Will Martin, MD;  [facility]: [facility];  Service: Cardiovascular;  Laterality: N/A;    Current Outpatient Medications  Medication Sig Dispense Refill   acetaminophen (TYLENOL) 500 MG tablet Take 1,000 mg by mouth every 8 (eight) hours as needed for headache, moderate pain or mild pain.     amiodarone (PACERONE) 200 MG tablet Take 1 tablet (200 mg total) by mouth daily. 30 tablet 6   apixaban (ELIQUIS) 5 MG TABS tablet TAKE ONE TABLET BY MOUTH TWICE DAILY 60 tablet 5   atorvastatin (LIPITOR) 80 MG tablet TAKE ONE TABLET BY MOUTH EVERY DAY AT 6:00PM (Patient taking differently: Take 30 mg by mouth daily.) 90 tablet 3   calcitRIOL (ROCALTROL) 0.25 MCG capsule Take 0.25 mcg by mouth every Monday, Wednesday, and Friday.     carvedilol  (COREG) 25 MG tablet Take 1 tablet (25 mg total) by mouth 2 (two) times daily. 60 tablet 6   Cholecalciferol 50 MCG (2000 UT) TABS Take 4,000 Units by mouth at bedtime.     clopidogrel (PLAVIX) 75 MG tablet TAKE ONE TABLET (75MG TOTAL) BY MOUTH DAILY 90 tablet 3   Continuous Blood Gluc Receiver (FREESTYLE LIBRE 2 READER) DEVI 1 each by Does not apply route daily. 1 each 0   Continuous Blood Gluc Sensor (FREESTYLE LIBRE 2 SENSOR) MISC USE ONE FOR FOURTEEN DAYS 6 each 3   furosemide (LASIX) 40 MG tablet Take 40 mg by mouth daily as needed for fluid or edema.     glucose blood (FREESTYLE PRECISION NEO TEST) test strip Use as instructed 100 each 3   Insulin NPH, Human,, Isophane, (NOVOLIN N FLEXPEN) 100 UNIT/ML Kiwkpen   20 units in am and 8-10 units before dinner 30 mL 2   levothyroxine (SYNTHROID) 112 MCG tablet TAKE ONE TABLET BY MOUTH ONCE DAILY 90 tablet 2   nitroGLYCERIN (NITROSTAT) 0.4 MG SL tablet Place 1 tablet (0.4 mg total) under the tongue every 5 (five) minutes x 3 doses as needed for chest pain (If not relief after 3rd dose, call 911 or go to ED). 25 tablet 0   rOPINIRole (REQUIP) 0.5 MG tablet Take 0.5 mg by mouth at bedtime.   0   Semaglutide, 2 MG/DOSE, 8 MG/3ML SOPN Inject 2 mg as directed once a week. (Patient taking differently: Inject 2 mg as directed once a week. Takes on Sunday) 9 mL 3   No current facility-administered medications for this encounter.    Allergies  Allergen Reactions   Penicillins Hives    Has patient had a PCN reaction causing immediate rash, facial/tongue/throat swelling, SOB or lightheadedness with hypotension: Yes Has patient had a PCN reaction causing severe rash involving mucus membranes or skin necrosis: No Has patient had a PCN reaction that required hospitalization No Has patient had a PCN reaction occurring within the last 10 years: No If all of the above answers are "NO", then may proceed with Cephalosporin use.   Percocet [Oxycodone-Acetaminophen]  Nausea And Vomiting    Social History   Socioeconomic History   Marital status: Married    Spouse name: Not on file   Number of children: Not on file   Years of education: Not on file   Highest education level: Not on file  Occupational History   Occupation: Retired    Employer: RETIRED    Comment: insurance billing  Tobacco Use   Smoking status: Never    Passive exposure: Never   Smokeless tobacco: Never   Tobacco comments:    Never smoke 10/08/22  Vaping Use   Vaping Use: Never used  Substance and Sexual Activity   Alcohol use: No    Alcohol/week: 0.0 standard drinks of alcohol   Drug use: No   Sexual activity: Never    Birth control/protection: Surgical    Comment: hyst  Other Topics Concern   Not on file  Social History Narrative   Not on file   Social Determinants of Health   Financial Resource Strain: Not on file  Food Insecurity: No Food Insecurity (05/10/2022)   Hunger Vital Sign    Worried About Running Out of Food in the Last Year: Never true    Ran Out of Food in the Last Year: Never true  Transportation Needs: No Transportation Needs (05/10/2022)   PRAPARE - Transportation    Lack of Transportation (Medical): No    Lack of Transportation (Non-Medical): No  Physical Activity: Not on file  Stress: Not on file  Social Connections: Not on file  Intimate Partner Violence: Not At Risk (05/10/2022)   Humiliation, Afraid, Rape, and Kick questionnaire    Fear of Current or Ex-Partner: No    Emotionally Abused: No    Physically Abused: No    Sexually Abused: No     ROS- All systems are reviewed and negative except as per the HPI above.  Physical Exam: Vitals:   10/08/22 1351  BP: (!) 144/90  Pulse: 77  Weight: 58 kg  Height: 5' 3" (1.6 m)     GEN- The patient is a well appearing elderly female, alert and oriented x 3 today.   HEENT-head normocephalic, atraumatic, sclera clear, conjunctiva pink, hearing intact, trachea midline.   Lungs- Clear to  ausculation bilaterally, normal work of breathing Heart- irregular rate and rhythm, no murmurs, rubs or gallops  GI- soft, NT, ND, + BS Extremities- no clubbing, cyanosis, or edema MS- no significant deformity or atrophy Skin- no rash or lesion Psych- euthymic mood, full affect Neuro- strength and sensation are intact   Wt Readings from Last 3 Encounters:  10/08/22 58 kg  09/14/22 58.7 kg  08/31/22 58.3 kg    EKG today demonstrates  V pacing with underlying atrial flutter Vent. rate 77 BPM PR interval 160 ms QRS duration 140 ms QT/QTcB 458/518 ms  TEE 08/14/22 demonstrated   1. Left ventricular ejection fraction, by estimation, is 30 to 35%. The  left ventricle has moderately decreased function. The left ventricle  demonstrates global hypokinesis.   2. Right ventricular systolic function is mildly reduced. The right  ventricular size is normal.   3. Left atrial size was mildly dilated. No left atrial/left atrial  appendage thrombus was detected.   4. The mitral valve is normal in structure. Mild mitral valve  regurgitation. No evidence of mitral stenosis.   5. The aortic valve is tricuspid. Aortic valve regurgitation is not  visualized. No aortic stenosis is present.   6. The inferior vena cava is normal in size with greater than 50%  respiratory variability, suggesting right atrial pressure of 3 mmHg.   Epic records are reviewed at length today  CHA2DS2-VASc Score = 6  The patient's score is based upon: CHF History: 1 HTN History: 1 Diabetes History: 0 Stroke History: 0 Vascular Disease History: 1 Age Score: 2 Gender Score: 1       ASSESSMENT AND PLAN: 1. Persistent Atrial Fibrillation/atrial flutter The patient's CHA2DS2-VASc score is 6, indicating a 9.7% annual risk of stroke.   S/p afib and flutter ablation 08/14/22 S/p DCCV 09/30/22 with quick return of atrial flutter. We discussed rhythm control options again today. Will increase amiodarone to 200 mg BID  and attempt DCCV one more time. Decrease back to 200 mg daily post DCCV.  Continue Eliquis 5 mg BID with no missed doses for 3 months post ablation.  Continue carvedilol 25 mg BID  2. Secondary Hypercoagulable State (ICD10:  D68.69) The patient is at significant risk for stroke/thromboembolism based upon her CHA2DS2-VASc Score of 6.  Continue Apixaban (Eliquis).   3. Chronic systolic CHF EF improved to 40-45% Fluid status appears stable today.  4. Obstructive sleep apnea Encouraged compliance with CPAP therapy.  5. SSS S/p PPM, followed by Dr Camnitz and the device clinic.  6. CAD No anginal symptoms.   Follow up with Dr Camnitz as scheduled.    Ricky Moniqua Engebretsen PA-C [facility] [address] [place], [place] [address] [phone] 10/08/2022 2:19 PM 

## 2022-10-12 DIAGNOSIS — L432 Lichenoid drug reaction: Secondary | ICD-10-CM | POA: Diagnosis not present

## 2022-10-12 DIAGNOSIS — Z85828 Personal history of other malignant neoplasm of skin: Secondary | ICD-10-CM | POA: Diagnosis not present

## 2022-10-12 DIAGNOSIS — Z08 Encounter for follow-up examination after completed treatment for malignant neoplasm: Secondary | ICD-10-CM | POA: Diagnosis not present

## 2022-10-13 DIAGNOSIS — I129 Hypertensive chronic kidney disease with stage 1 through stage 4 chronic kidney disease, or unspecified chronic kidney disease: Secondary | ICD-10-CM | POA: Diagnosis not present

## 2022-10-13 DIAGNOSIS — I5032 Chronic diastolic (congestive) heart failure: Secondary | ICD-10-CM | POA: Diagnosis not present

## 2022-10-13 DIAGNOSIS — R809 Proteinuria, unspecified: Secondary | ICD-10-CM | POA: Diagnosis not present

## 2022-10-13 DIAGNOSIS — E1129 Type 2 diabetes mellitus with other diabetic kidney complication: Secondary | ICD-10-CM | POA: Diagnosis not present

## 2022-10-13 DIAGNOSIS — E1122 Type 2 diabetes mellitus with diabetic chronic kidney disease: Secondary | ICD-10-CM | POA: Diagnosis not present

## 2022-10-13 DIAGNOSIS — N189 Chronic kidney disease, unspecified: Secondary | ICD-10-CM | POA: Diagnosis not present

## 2022-10-13 DIAGNOSIS — E211 Secondary hyperparathyroidism, not elsewhere classified: Secondary | ICD-10-CM | POA: Diagnosis not present

## 2022-10-15 ENCOUNTER — Ambulatory Visit (INDEPENDENT_AMBULATORY_CARE_PROVIDER_SITE_OTHER): Payer: PPO | Admitting: Podiatry

## 2022-10-15 DIAGNOSIS — M79675 Pain in left toe(s): Secondary | ICD-10-CM

## 2022-10-15 DIAGNOSIS — Z7901 Long term (current) use of anticoagulants: Secondary | ICD-10-CM

## 2022-10-15 DIAGNOSIS — B351 Tinea unguium: Secondary | ICD-10-CM | POA: Diagnosis not present

## 2022-10-15 DIAGNOSIS — M79674 Pain in right toe(s): Secondary | ICD-10-CM | POA: Diagnosis not present

## 2022-10-15 DIAGNOSIS — E119 Type 2 diabetes mellitus without complications: Secondary | ICD-10-CM

## 2022-10-15 NOTE — Progress Notes (Signed)
  Subjective:  Patient ID: Virginia Huber, female    DOB: 1945/08/20,  MRN: [id number]  Chief Complaint  Patient presents with   Nail Problem    DFC/Nail trim BS-101 A1C-5.8    77 y.o. female presents with the above complaint. History confirmed with patient. Patient presenting with pain related to dystrophic thickened elongated nails. Patient is unable to trim own nails related to nail dystrophy and/or mobility issues. Patient does have a history of T2DM.   Objective:  Physical Exam: warm, good capillary refill nail exam onychomycosis of the toenails, onycholysis, and dystrophic nails DP pulses palpable, PT pulses palpable, and protective sensation intact Left Foot:  Pain with palpation of nails due to elongation and dystrophic growth.  Right Foot: Pain with palpation of nails due to elongation and dystrophic growth.   Assessment:   1. Pain due to onychomycosis of toenails of both feet   2. Diabetes mellitus without complication   3. Anticoagulated       Plan:  Patient was evaluated and treated and all questions answered.  #Onychomycosis with pain  -Nails palliatively debrided as below. -Educated on self-care  Procedure: Nail Debridement Rationale: Pain Type of Debridement: manual, sharp debridement. Instrumentation: Nail nipper, rotary burr. Number of Nails: 10  Return in about 3 months (around 01/14/2023) for [facility].         Corinna Gab, DPM [facility] / [facility]

## 2022-10-20 DIAGNOSIS — N189 Chronic kidney disease, unspecified: Secondary | ICD-10-CM | POA: Diagnosis not present

## 2022-10-20 DIAGNOSIS — E1122 Type 2 diabetes mellitus with diabetic chronic kidney disease: Secondary | ICD-10-CM | POA: Diagnosis not present

## 2022-10-20 DIAGNOSIS — R809 Proteinuria, unspecified: Secondary | ICD-10-CM | POA: Diagnosis not present

## 2022-10-20 DIAGNOSIS — E1129 Type 2 diabetes mellitus with other diabetic kidney complication: Secondary | ICD-10-CM | POA: Diagnosis not present

## 2022-10-23 ENCOUNTER — Other Ambulatory Visit (HOSPITAL_COMMUNITY): Payer: Self-pay | Admitting: Physician Assistant

## 2022-10-23 DIAGNOSIS — I480 Paroxysmal atrial fibrillation: Secondary | ICD-10-CM | POA: Diagnosis not present

## 2022-10-24 LAB — CBC
Hematocrit: 39.5 % (ref 34.0–46.6)
Hemoglobin: 12.9 g/dL (ref 11.1–15.9)
MCH: 29.2 pg (ref 26.6–33.0)
MCHC: 32.7 g/dL (ref 31.5–35.7)
MCV: 89 fL (ref 79–97)
Platelets: 243 10*3/uL (ref 150–450)
RBC: 4.42 x10E6/uL (ref 3.77–5.28)
RDW: 15.8 % — ABNORMAL HIGH (ref 11.7–15.4)
WBC: 7.9 10*3/uL (ref 3.4–10.8)

## 2022-10-24 LAB — BASIC METABOLIC PANEL
BUN/Creatinine Ratio: 12 (ref 12–28)
BUN: 18 mg/dL (ref 8–27)
CO2: 24 mmol/L (ref 20–29)
Calcium: 9 mg/dL (ref 8.7–10.3)
Chloride: 103 mmol/L (ref 96–106)
Creatinine, Ser: 1.47 mg/dL — ABNORMAL HIGH (ref 0.57–1.00)
Glucose: 145 mg/dL — ABNORMAL HIGH (ref 70–99)
Potassium: 4 mmol/L (ref 3.5–5.2)
Sodium: 143 mmol/L (ref 134–144)
eGFR: 37 mL/min/{1.73_m2} — ABNORMAL LOW (ref >59)

## 2022-10-28 DIAGNOSIS — G4733 Obstructive sleep apnea (adult) (pediatric): Secondary | ICD-10-CM | POA: Diagnosis not present

## 2022-10-28 NOTE — Progress Notes (Addendum)
Spoke with patient, Procedure scheduled for 1100, Please arrive at the hospital at  1000, NPO after midnight on Wednesday, May take meds with sips of water in the AM, please have transportation for home post procedure, and someone to stay with pt for approximately 24 hours after  Pt has not taken Ozempic for over a week

## 2022-10-29 ENCOUNTER — Encounter (HOSPITAL_COMMUNITY)
Admission: RE | Disposition: A | Discharge status: Home / Self Care | Payer: Self-pay | Source: Home / Self Care | Attending: Cardiology

## 2022-10-29 ENCOUNTER — Ambulatory Visit (HOSPITAL_BASED_OUTPATIENT_CLINIC_OR_DEPARTMENT_OTHER): Payer: PPO | Admitting: Anesthesiology

## 2022-10-29 ENCOUNTER — Ambulatory Visit (HOSPITAL_COMMUNITY): Payer: PPO | Admitting: Anesthesiology

## 2022-10-29 ENCOUNTER — Other Ambulatory Visit: Payer: Self-pay

## 2022-10-29 ENCOUNTER — Encounter (HOSPITAL_COMMUNITY): Payer: Self-pay | Admitting: Cardiology

## 2022-10-29 ENCOUNTER — Ambulatory Visit (HOSPITAL_COMMUNITY)
Admission: RE | Admit: 2022-10-29 | Discharge: 2022-10-29 | Disposition: A | Discharge status: Home / Self Care | Payer: PPO | Attending: Cardiology | Admitting: Cardiology

## 2022-10-29 DIAGNOSIS — Z950 Presence of cardiac pacemaker: Secondary | ICD-10-CM | POA: Diagnosis not present

## 2022-10-29 DIAGNOSIS — I5022 Chronic systolic (congestive) heart failure: Secondary | ICD-10-CM | POA: Diagnosis not present

## 2022-10-29 DIAGNOSIS — Z79899 Other long term (current) drug therapy: Secondary | ICD-10-CM | POA: Insufficient documentation

## 2022-10-29 DIAGNOSIS — E1159 Type 2 diabetes mellitus with other circulatory complications: Secondary | ICD-10-CM

## 2022-10-29 DIAGNOSIS — E039 Hypothyroidism, unspecified: Secondary | ICD-10-CM

## 2022-10-29 DIAGNOSIS — D6869 Other thrombophilia: Secondary | ICD-10-CM | POA: Diagnosis not present

## 2022-10-29 DIAGNOSIS — I2510 Atherosclerotic heart disease of native coronary artery without angina pectoris: Secondary | ICD-10-CM | POA: Insufficient documentation

## 2022-10-29 DIAGNOSIS — I509 Heart failure, unspecified: Secondary | ICD-10-CM | POA: Diagnosis not present

## 2022-10-29 DIAGNOSIS — I495 Sick sinus syndrome: Secondary | ICD-10-CM | POA: Diagnosis not present

## 2022-10-29 DIAGNOSIS — G4733 Obstructive sleep apnea (adult) (pediatric): Secondary | ICD-10-CM | POA: Insufficient documentation

## 2022-10-29 DIAGNOSIS — I4892 Unspecified atrial flutter: Secondary | ICD-10-CM | POA: Diagnosis not present

## 2022-10-29 DIAGNOSIS — I110 Hypertensive heart disease with heart failure: Secondary | ICD-10-CM | POA: Diagnosis not present

## 2022-10-29 DIAGNOSIS — E1151 Type 2 diabetes mellitus with diabetic peripheral angiopathy without gangrene: Secondary | ICD-10-CM | POA: Diagnosis not present

## 2022-10-29 DIAGNOSIS — E1165 Type 2 diabetes mellitus with hyperglycemia: Secondary | ICD-10-CM

## 2022-10-29 DIAGNOSIS — I4891 Unspecified atrial fibrillation: Secondary | ICD-10-CM

## 2022-10-29 DIAGNOSIS — I484 Atypical atrial flutter: Secondary | ICD-10-CM

## 2022-10-29 DIAGNOSIS — I252 Old myocardial infarction: Secondary | ICD-10-CM

## 2022-10-29 DIAGNOSIS — E1149 Type 2 diabetes mellitus with other diabetic neurological complication: Secondary | ICD-10-CM

## 2022-10-29 DIAGNOSIS — Z7985 Long-term (current) use of injectable non-insulin antidiabetic drugs: Secondary | ICD-10-CM | POA: Diagnosis not present

## 2022-10-29 DIAGNOSIS — I4819 Other persistent atrial fibrillation: Secondary | ICD-10-CM | POA: Diagnosis not present

## 2022-10-29 DIAGNOSIS — Z794 Long term (current) use of insulin: Secondary | ICD-10-CM | POA: Insufficient documentation

## 2022-10-29 DIAGNOSIS — Z7901 Long term (current) use of anticoagulants: Secondary | ICD-10-CM | POA: Diagnosis not present

## 2022-10-29 HISTORY — PX: CARDIOVERSION: SHX1299

## 2022-10-29 LAB — GLUCOSE, CAPILLARY: Glucose-Capillary: 131 mg/dL — ABNORMAL HIGH (ref 70–99)

## 2022-10-29 SURGERY — CARDIOVERSION
Anesthesia: General

## 2022-10-29 MED ORDER — SODIUM CHLORIDE 0.9 % IV SOLN: INTRAVENOUS | Status: DC

## 2022-10-29 MED ORDER — PROPOFOL 10 MG/ML IV BOLUS
INTRAVENOUS | Status: DC | PRN
  Administered 2022-10-29: 50 mg via INTRAVENOUS

## 2022-10-29 MED ORDER — NOVOLIN N FLEXPEN 100 UNIT/ML ~~LOC~~ SUPN
10.0000 [IU] | PEN_INJECTOR | SUBCUTANEOUS | Status: DC
Start: 2022-10-29 — End: 2023-02-01

## 2022-10-29 MED ORDER — LIDOCAINE 2% (20 MG/ML) 5 ML SYRINGE
INTRAMUSCULAR | Status: DC | PRN
  Administered 2022-10-29: 60 mg via INTRAVENOUS

## 2022-10-29 SURGICAL SUPPLY — 1 items: ELECT DEFIB PAD ADLT CADENCE (PAD) ×2 IMPLANT

## 2022-10-29 NOTE — Anesthesia Preprocedure Evaluation (Signed)
Anesthesia Evaluation  Patient identified by MRN, date of birth, ID band Patient awake    Reviewed: Allergy & Precautions, H&P , NPO status , Patient's Chart, lab work & pertinent test results  Airway Mallampati: II   Neck ROM: full    Dental   Pulmonary sleep apnea    breath sounds clear to auscultation       Cardiovascular hypertension, + CAD, + Past MI, + Peripheral Vascular Disease and +CHF  + dysrhythmias Atrial Fibrillation + pacemaker  Rhythm:irregular Rate:Normal     Neuro/Psych TIA Neuromuscular disease    GI/Hepatic ,GERD  ,,  Endo/Other  diabetes, Type 2Hypothyroidism    Renal/GU Renal InsufficiencyRenal disease     Musculoskeletal  (+) Arthritis ,    Abdominal   Peds  Hematology   Anesthesia Other Findings   Reproductive/Obstetrics                             Anesthesia Physical Anesthesia Plan  ASA: 3  Anesthesia Plan: General   Post-op Pain Management:    Induction: Intravenous  PONV Risk Score and Plan: 3 and Treatment may vary due to age or medical condition and Propofol infusion  Airway Management Planned: Mask  Additional Equipment:   Intra-op Plan:   Post-operative Plan:   Informed Consent: I have reviewed the patients History and Physical, chart, labs and discussed the procedure including the risks, benefits and alternatives for the proposed anesthesia with the patient or authorized representative who has indicated his/her understanding and acceptance.     Dental advisory given  Plan Discussed with: CRNA, Anesthesiologist and Surgeon  Anesthesia Plan Comments:        Anesthesia Quick Evaluation

## 2022-10-29 NOTE — CV Procedure (Signed)
   Electrical Cardioversion Procedure Note Virginia Huber [id number] 05/07/1946  Procedure: Electrical Cardioversion Indications:  Atrial Fibrillation  Procedure Details Consent: Risks of procedure as well as the alternatives and risks of each were explained to the (patient/caregiver).  Consent for procedure obtained. Time Out: Verified patient identification, verified procedure, site/side was marked, verified correct patient position, special equipment/implants available, medications/allergies/relevent history reviewed, required imaging and test results available.  Performed  Patient placed on cardiac monitor, pulse oximetry, supplemental oxygen as necessary.  Sedation given:  50mg  Propofol and 60mg  Lidocaine Pacer pads placed anterior and posterior chest.  Cardioverted 1 time(s).  Cardioverted at 150J.  Evaluation Findings: Post procedure EKG shows: NSR Complications: None Patient did tolerate procedure well.   Virginia Huber 10/29/2022, 11:06 AM

## 2022-10-29 NOTE — Interval H&P Note (Signed)
History and Physical Interval Note:  10/29/2022 10:07 AM  Virginia Huber  has presented today for surgery, with the diagnosis of AFIB.  The various methods of treatment have been discussed with the patient and family. After consideration of risks, benefits and other options for treatment, the patient has consented to  Procedure(s): CARDIOVERSION (N/A) as a surgical intervention.  The patient's history has been reviewed, patient examined, no change in status, stable for surgery.  I have reviewed the patient's chart and labs.  Questions were answered to the patient's satisfaction.     Armanda Magic

## 2022-10-29 NOTE — Transfer of Care (Signed)
Immediate Anesthesia Transfer of Care Note  Patient: Virginia Huber  Procedure(s) Performed: CARDIOVERSION  Patient Location: Cath Lab  Anesthesia Type:General  Level of Consciousness: awake  Airway & Oxygen Therapy: Patient Spontanous Breathing  Post-op Assessment: Report given to RN  Post vital signs: Reviewed and stable  Last Vitals:  Vitals Value Taken Time  BP 166/67   Temp    Pulse 51 10/29/22 1127  Resp 22 10/29/22 1127  SpO2 94 % 10/29/22 1127  Vitals shown include unvalidated device data.  Last Pain:  Vitals:   10/29/22 1015  TempSrc:   PainSc: 0-No pain         Complications: No notable events documented.

## 2022-10-30 ENCOUNTER — Encounter (HOSPITAL_COMMUNITY): Payer: Self-pay | Admitting: Cardiology

## 2022-11-02 NOTE — Anesthesia Postprocedure Evaluation (Signed)
Anesthesia Post Note  Patient: Virginia Huber  Procedure(s) Performed: CARDIOVERSION     Patient location during evaluation: Cath Lab Anesthesia Type: General Level of consciousness: awake and alert Pain management: pain level controlled Vital Signs Assessment: post-procedure vital signs reviewed and stable Respiratory status: spontaneous breathing, nonlabored ventilation, respiratory function stable and patient connected to nasal cannula oxygen Cardiovascular status: blood pressure returned to baseline and stable Postop Assessment: no apparent nausea or vomiting Anesthetic complications: no   No notable events documented.  Last Vitals:  Vitals:   10/29/22 1145 10/29/22 1150  BP: (!) 174/66 (!) 174/69  Pulse: (!) 56 71  Resp: 15 (!) 27  Temp:    SpO2: 96% 92%    Last Pain:  Vitals:   10/29/22 1150  TempSrc:   PainSc: 0-No pain                 [name]

## 2022-11-03 ENCOUNTER — Encounter: Payer: Self-pay | Admitting: Neurology

## 2022-11-03 ENCOUNTER — Ambulatory Visit: Payer: PPO | Admitting: Neurology

## 2022-11-03 VITALS — BP 140/75 | HR 83 | Ht 63.0 in | Wt 131.2 lb

## 2022-11-03 DIAGNOSIS — L720 Epidermal cyst: Secondary | ICD-10-CM | POA: Diagnosis not present

## 2022-11-03 DIAGNOSIS — I480 Paroxysmal atrial fibrillation: Secondary | ICD-10-CM | POA: Diagnosis not present

## 2022-11-03 DIAGNOSIS — G4733 Obstructive sleep apnea (adult) (pediatric): Secondary | ICD-10-CM

## 2022-11-03 DIAGNOSIS — G620 Drug-induced polyneuropathy: Secondary | ICD-10-CM

## 2022-11-03 DIAGNOSIS — I5030 Unspecified diastolic (congestive) heart failure: Secondary | ICD-10-CM

## 2022-11-03 DIAGNOSIS — L280 Lichen simplex chronicus: Secondary | ICD-10-CM | POA: Diagnosis not present

## 2022-11-03 MED ORDER — ROPINIROLE HCL 0.5 MG PO TABS: 0.5000 mg | ORAL_TABLET | Freq: Every day | ORAL | 5 refills | Status: DC

## 2022-11-03 MED ORDER — ALPRAZOLAM 0.25 MG PO TABS: 0.2500 mg | ORAL_TABLET | Freq: Every evening | ORAL | 1 refills | Status: DC | PRN

## 2022-11-03 NOTE — Progress Notes (Signed)
Provider:  Melvyn Novas, MD  Primary Care Physician:  Assunta Found, MD [address] [place] [address]     Referring Provider: Assunta Found, Md [address] [place],  [place] [address]          Chief Complaint according to patient   Patient presents with:     New Patient (Initial Visit)           HISTORY OF PRESENT ILLNESS:  Virginia Huber is a 77 y.o. female patient who is here for revisit 11/03/2022 for  CPAP follow up..  Chief concern according to patient :  " I have a hard time with my hard since November 2023, cardioversion for atrial fibrillation, in February I had an ablation, but relapsed into a- fib. I am on Eloquis an plavix,  EF 35 %, with severe fatigue- I just get wiped out by gardening, even just walking. Sleep is not better- still on amiodarone."   The patient endorsed the Epworth Sleepiness Scale today at 3 out of 24 points and fatigue severity scale at 38 out of 63 points.  She is much more fatigued than actually sleepy and sleep is actually of less quality recently.  She is a little bit frustrated and certainly also worried about her health but she is not depressed.    Looking at the patient's home sleep test result from January she had 23% REM sleep total sleep time duration of 8 hours 18 minutes and then calculated AHI of 34 which during REM sleep increased to 44.5/h.  Pulse rate varied also they cannot see cardiac rhythm on these home sleep test devices she did have a prolonged time with low oxygen to desired and her nadir of oxygen was 79%.  So she was placed on CPAP and has been a compliant user.  Average user time is 7 hours 26 minutes on days used the CPAP is set at 10 cm of water pressure with 3 cm EPR, her residual AHI is 2.2/h and her 95th percentile air leak is 10 L a minute which is mild.  So I am happy with her compliance and her reduction in AHI and I attribute the high level of fatigue to the reduced ejection fraction by  atrial fibrillation, basically she is in diastolic heart failure.                             05/07/22 ALL:  [name] returns for follow up for OSA on CPAP. She is doing well on therapy. She does not like to use her machine but understands medical need. She does not feel any better when using CPAP. If she travels she does not take it with her and feels that she sleeps well.    Bilateral hand tremor seems to be worsening. She is scheduled for an ablation in 07/2022 and hoping to discontinue amiodarone. She is hoping tremor improves.         CD : Virginia Huber is a 77 y.o. female , seen here as a referral from Dr. Prentice Docker, Mrs. Rosell was originally  referred by her cardiologist for a sleep study to the [facility] location, she completed the sleep study but then was never given the results, a phone call or a follow-up appointment. She stated that she had similar experiences with the physician before when she had suffered a TIA and her follow-up appointments were  not properly made and results not discussed. I was surprised to see a patient who just had a sleep study on 04/12/2016 30 days later in my office to follow-up on a study that I have not performed. Based on the report the patient has a body mass index of 64, is 77 years of age, and went to the sleep lab 04/12/2016. Her AHI was only 10 per hour which constitutes mild sleep apnea. In supine sleep her AHI was 14.3, the lowest oxygen saturation at night was 86% and the technologist noted loud snoring. She slept about half of the night in supine sleep position she reached REM sleep with an AHI of 29.3 per hour.  Review of Systems: Out of a complete 14 system review, the patient complains of only the following symptoms, and all other reviewed systems are negative.:  Fatigue, sleepiness , palpitations,  bleeding, bruising.   Atrial fib. Insomnia.  CPAP user.     How likely are you to doze in the following  situations: 0 = not likely, 1 = slight chance, 2 = moderate chance, 3 = high chance   Sitting and Reading? Watching Television? Sitting inactive in a public place (theater or meeting)? As a passenger in a car for an hour without a break? Lying down in the afternoon when circumstances permit? Sitting and talking to someone? Sitting quietly after lunch without alcohol? In a car, while stopped for a few minutes in traffic?   Total = 3/ 24 points   FSS endorsed at 38/ 63 points.   Social History   Socioeconomic History   Marital status: Married    Spouse name: Not on file   Number of children: Not on file   Years of education: Not on file   Highest education level: Not on file  Occupational History   Occupation: Retired    Associate Professor: RETIRED    Comment: insurance billing  Tobacco Use   Smoking status: Never    Passive exposure: Never   Smokeless tobacco: Never   Tobacco comments:    Never smoke 10/08/22  Vaping Use   Vaping Use: Never used  Substance and Sexual Activity   Alcohol use: No    Alcohol/week: 0.0 standard drinks of alcohol   Drug use: No   Sexual activity: Never    Birth control/protection: Surgical    Comment: hyst  Other Topics Concern   Not on file  Social History Narrative   Not on file   Social Determinants of Health   Financial Resource Strain: Not on file  Food Insecurity: No Food Insecurity (05/10/2022)   Hunger Vital Sign    Worried About Running Out of Food in the Last Year: Never true    Ran Out of Food in the Last Year: Never true  Transportation Needs: No Transportation Needs (05/10/2022)   PRAPARE - Administrator, Civil Service (Medical): No    Lack of Transportation (Non-Medical): No  Physical Activity: Not on file  Stress: Not on file  Social Connections: Not on file    Family History  Problem Relation Age of Onset   Heart disease Mother        deceased   Heart disease Father        deceased, heart disease    Diabetes Brother    Heart disease Brother    Thyroid disease Brother    Heart disease Sister    Heart disease Brother    Thyroid disease Brother    Lupus  Daughter    Colon cancer Neg Hx     Past Medical History:  Diagnosis Date   (HFpEF) heart failure with preserved ejection fraction (HCC)    Advanced nonexudative age-related macular degeneration of left eye with subfoveal involvement 06/26/2020   Ongoing, accounts for acuity   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 h/o PAF/flutter during admission in 2013 for PNA. b. PAF during adm for NSTEMI 07/2015, subsequent paroxysms since then.   B12 deficiency anemia    Branch retinal vein occlusion with macular edema of right eye 10/11/2019   Chronic renal failure, stage 3b (HCC) 09/23/2017   Coronary artery disease 11/30/2014   a. remote MI. b. h/o PTCA with scoring balloon to OM1 11/2014. c. NSTEMI 03/2015 s/p DES to prox-mid Cx. d. NSTEMI 07/2015 s/p scoring balloon/PTCA/DES to dRCA with PAF during that admission   Cutaneous lupus erythematosus    Early stage nonexudative age-related macular degeneration of right eye 02/27/2020   Essential hypertension    GERD (gastroesophageal reflux disease)    History of blood transfusion 1980's   2nd surgical procedures   Hypercholesteremia    Hypothyroidism    Myocardial infarction (HCC) 02/2012   NSTEMI (non-ST elevated myocardial infarction) (HCC) 04/02/2015   OSA (obstructive sleep apnea) 05/13/2016   Ovarian tumor    PAD (peripheral artery disease) (HCC)    a. s/p LE angio 2015; followed by Dr. Kirke Corin - managed medically.   Pericardial effusion    a. 06/2016 after ppm - s/p pericardiocentesis.   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 s/p Medtronic PPM 06/2016, c/b lead perf/pericardial effusion.   TIA (transient ischemic attack)    Type 2 diabetes with nephropathy (HCC) 02/29/2012    Past Surgical History:  Procedure Laterality Date   ABDOMINAL AORTAGRAM N/A 01/03/2014   Procedure: ABDOMINAL Ronny Flurry;  Surgeon: Iran Ouch, MD;  Location: [facility] CATH LAB;  Service: Cardiovascular;  Laterality: N/A;   ABDOMINAL HYSTERECTOMY  06/22/1970   "partial"   APPENDECTOMY  02/20/1969   ATRIAL FIBRILLATION ABLATION N/A 08/14/2022   Procedure: ATRIAL FIBRILLATION ABLATION;  Surgeon: Regan Lemming, MD;  Location: [facility] INVASIVE CV LAB;  Service: Cardiovascular;  Laterality: N/A;   CARDIAC CATHETERIZATION  06/22/2006   Tiny OM-2 with 90% narrowing. Med tx.   CARDIAC CATHETERIZATION N/A 11/30/2014   Procedure: Left Heart Cath and Coronary Angiography;  Surgeon: Lennette Bihari, MD; LAD 20%, CFX 50%, OM1 95%, right PLB 30%, LV normal    CARDIAC CATHETERIZATION N/A 11/30/2014   Procedure: Coronary Balloon Angioplasty;  Surgeon: Lennette Bihari, MD;  Angiosculpt scoring balloon and PTCA to the OM1 reducing stenosis from 95% to less than 10%   CARDIAC CATHETERIZATION N/A 04/03/2015   Procedure: Left Heart Cath and Coronary Angiography;  Surgeon: Dolores Patty, MD; dLAD 50%, CFX 90%, OM1 100%, PLA 15%, LVEDP 13     CARDIAC CATHETERIZATION N/A 04/03/2015   Procedure: Coronary Stent Intervention;  Surgeon: Tonny Bollman, MD; 3.0x18 mm Xience DES to the CFX     CARDIAC CATHETERIZATION N/A 08/02/2015  Procedure: Left Heart Cath and Coronary Angiography;  Surgeon: Lennette Bihari, MD;  [facility];  Service: Cardiovascular;  Laterality: N/A;   CARDIAC CATHETERIZATION N/A 08/02/2015   Procedure: Coronary Stent Intervention;  Surgeon: Lennette Bihari, MD;  [facility]: [facility];  Service: Cardiovascular;  Laterality: N/A;   CARDIAC CATHETERIZATION N/A 06/25/2016   Procedure: Pericardiocentesis;  Surgeon: Will Jorja Loa, MD;  Location: [facility];  Service: Cardiovascular;  Laterality: N/A;   cardiac stents     CARDIOVERSION N/A 12/15/2017   Procedure: CARDIOVERSION;  Surgeon: Elease Hashimoto Deloris Ping, MD;  Location: [facility] ENDOSCOPY;  Service: Cardiovascular;  Laterality: N/A;   CARDIOVERSION N/A 05/12/2022   Procedure: CARDIOVERSION;  Surgeon: Quintella Reichert, MD;  Location: [facility] ENDOSCOPY;  Service: Cardiovascular;  Laterality: N/A;   CARDIOVERSION N/A 09/30/2022   Procedure: CARDIOVERSION;  Surgeon: Thurmon Fair, MD;  [facility]: [facility];  Service: Cardiovascular;  Laterality: N/A;   CARDIOVERSION N/A 10/29/2022   Procedure: CARDIOVERSION;  Surgeon: Quintella Reichert, MD;  [facility]: [facility];  Service: Cardiovascular;  Laterality: N/A;   CHOLECYSTECTOMY OPEN  02/20/1989   COLONOSCOPY  06/23/2003   Dr. Karilyn Cota: pancolonic divericula, polyp, path unknown currently   COLONOSCOPY  06/22/2010   Dr. Darrick Penna: Normal TI, scattered diverticula in entire colon, small internal hemorrhoids, normal colon biopsies. Colonoscopy in 5-10 years.    COLONOSCOPY WITH PROPOFOL N/A 06/02/2021   pancolonic diverticulosis. Two 4-6 mm polyps in transverse colon. Sessile serrated and hyperplastic. 5 year surveillance if benefits outweigh the risks.   COLOSTOMY  05/23/1979   COLOSTOMY REVERSAL  11/21/1979   EP IMPLANTABLE DEVICE N/A 06/25/2016   Procedure: Lead Revision/Repair;  Surgeon: Will Jorja Loa, MD;  [facility]: [facility];  Service: Cardiovascular;  Laterality: N/A;   EP IMPLANTABLE DEVICE N/A 06/25/2016   Procedure: Pacemaker Implant;  Surgeon: Will Jorja Loa, MD;  [facility]: [facility];  Service: Cardiovascular;  Laterality: N/A;   EXCISIONAL HEMORRHOIDECTOMY  02/20/1969   EYE SURGERY Left  06/22/1998   "branch vein occlusion"   EYE SURGERY Left ~ 2001   "smoothed out wrinkle"   LEFT HEART CATH AND CORONARY ANGIOGRAPHY N/A 05/11/2022   Procedure: LEFT HEART CATH AND CORONARY ANGIOGRAPHY;  Surgeon: Kathleene Hazel, MD;  Location: [facility];  Service: Cardiovascular;  Laterality: N/A;   LEFT OOPHORECTOMY  05/23/1979   nicked bowel, peritonitis, colostomy; colostomy reversed 1981    LOWER EXTREMITY ANGIOGRAM N/A 01/03/2014   Procedure: LOWER EXTREMITY ANGIOGRAM;  Surgeon: Iran Ouch, MD;  Location: [facility] CATH [facility];  Service: Cardiovascular;  Laterality: N/A;   Nuclear med stress test  10/21/2011   Small area of mild ischemia inferoapically.   PARTIAL HYSTERECTOMY  02/20/1969   left ovaries, then ovaries removed later due tumors    POLYPECTOMY  06/02/2021   Procedure: POLYPECTOMY;  Surgeon: Lanelle Bal, DO;  Location: [facility];  Service: Endoscopy;;   right eye surgery  03/2021   RIGHT OOPHORECTOMY  02/20/1969   TEE WITHOUT CARDIOVERSION N/A 05/12/2022   Procedure: TRANSESOPHAGEAL ECHOCARDIOGRAM (TEE);  Surgeon: Quintella Reichert, MD;  Location: [facility] ENDOSCOPY;  Service: Cardiovascular;  Laterality: N/A;   TEE WITHOUT CARDIOVERSION N/A 08/14/2022   Procedure: TRANSESOPHAGEAL ECHOCARDIOGRAM;  Surgeon: Regan Lemming, MD;  Location: [facility] INVASIVE CV LAB;  Service: Cardiovascular;  Laterality: N/A;     Current Outpatient Medications on File Prior to Visit  Medication Sig Dispense Refill  acetaminophen (TYLENOL) 500 MG tablet Take 1,000 mg by mouth every 8 (eight) hours as needed for headache, moderate pain or mild pain.     amiodarone (PACERONE) 200 MG tablet Take 1 tablet by mouth twice a day for 21 days then reduce back to 1 tablet daily 51 tablet 0   apixaban (ELIQUIS) 5 MG TABS tablet TAKE ONE TABLET BY MOUTH TWICE DAILY 60 tablet 5   atorvastatin (LIPITOR) 80 MG tablet TAKE ONE TABLET BY MOUTH EVERY DAY AT 6:00PM 90 tablet 3   calcitRIOL  (ROCALTROL) 0.25 MCG capsule Take 0.25 mcg by mouth every Monday, Wednesday, and Friday.     carvedilol (COREG) 25 MG tablet Take 1 tablet (25 mg total) by mouth 2 (two) times daily. 60 tablet 6   Cholecalciferol 50 MCG (2000 UT) TABS Take 4,000 Units by mouth at bedtime.     clopidogrel (PLAVIX) 75 MG tablet TAKE ONE TABLET (75MG  TOTAL) BY MOUTH DAILY 90 tablet 3   Continuous Blood Gluc Receiver (FREESTYLE LIBRE 2 READER) DEVI 1 each by Does not apply route daily. 1 each 0   Continuous Blood Gluc Sensor (FREESTYLE LIBRE 2 SENSOR) MISC USE ONE FOR FOURTEEN DAYS 6 each 3   furosemide (LASIX) 40 MG tablet Take 40 mg by mouth daily as needed for fluid or edema.     glucose blood (FREESTYLE PRECISION NEO TEST) test strip Use as instructed 100 each 3   Insulin NPH, Human,, Isophane, (NOVOLIN N FLEXPEN) 100 UNIT/ML Kiwkpen Inject 10-15 Units into the skin as directed. Inject 15 units subcutaneously in the morning & inject 10 units subcutaneously at night.     levothyroxine (SYNTHROID) 112 MCG tablet TAKE ONE TABLET BY MOUTH ONCE DAILY 90 tablet 2   nitroGLYCERIN (NITROSTAT) 0.4 MG SL tablet Place 1 tablet (0.4 mg total) under the tongue every 5 (five) minutes x 3 doses as needed for chest pain (If not relief after 3rd dose, call 911 or go to ED). 25 tablet 0   rOPINIRole (REQUIP) 0.5 MG tablet Take 0.5 mg by mouth at bedtime.   0   Semaglutide, 2 MG/DOSE, 8 MG/3ML SOPN Inject 2 mg as directed once a week. (Patient taking differently: Inject 2 mg into the skin every Sunday.) 9 mL 3   No current facility-administered medications on file prior to visit.    Allergies  Allergen Reactions   Penicillins Hives    Has patient had a PCN reaction causing immediate rash, facial/tongue/throat swelling, SOB or lightheadedness with hypotension: Yes Has patient had a PCN reaction causing severe rash involving mucus membranes or skin necrosis: No Has patient had a PCN reaction that required hospitalization No Has  patient had a PCN reaction occurring within the last 10 years: No If all of the above answers are "NO", then may proceed with Cephalosporin use.   Percocet [Oxycodone-Acetaminophen] Nausea And Vomiting     DIAGNOSTIC DATA (LABS, IMAGING, TESTING) - I reviewed patient records, labs, notes, testing and imaging myself where available.  Lab Results  Component Value Date   WBC 7.9 10/23/2022   HGB 12.9 10/23/2022   HCT 39.5 10/23/2022   MCV 89 10/23/2022   PLT 243 10/23/2022      Component Value Date/Time   NA 143 10/23/2022 0801   K 4.0 10/23/2022 0801   CL 103 10/23/2022 0801   CO2 24 10/23/2022 0801   GLUCOSE 145 (H) 10/23/2022 0801   GLUCOSE 224 (H) 08/16/2022 1208   BUN 18 10/23/2022 0801  CREATININE 1.47 (H) 10/23/2022 0801   CREATININE 1.11 (H) 12/31/2017 0850   CALCIUM 9.0 10/23/2022 0801   PROT 6.0 09/25/2022 0848   ALBUMIN 4.1 09/25/2022 0848   AST 32 09/25/2022 0848   ALT 76 (H) 09/25/2022 0848   ALKPHOS 73 09/25/2022 0848   BILITOT 0.6 09/25/2022 0848   GFRNONAA 27 (L) 08/16/2022 1208   GFRNONAA 43 (L) 08/05/2017 0807   GFRAA 39 (L) 08/26/2019 0901   GFRAA 50 (L) 08/05/2017 0807   Lab Results  Component Value Date   CHOL 96 05/10/2022   HDL 30 (L) 05/10/2022   LDLCALC 35 05/10/2022   TRIG 156 (H) 05/10/2022   CHOLHDL 3.2 05/10/2022   Lab Results  Component Value Date   HGBA1C 5.8 (A) 07/15/2022   Lab Results  Component Value Date   VITAMINB12 194 (L) 03/02/2012   Lab Results  Component Value Date   TSH 2.100 09/25/2022    PHYSICAL EXAM:  Today's Vitals   11/03/22 1028  BP: (!) 140/75  Pulse: 83  Weight: 131 lb 3.2 oz (59.5 kg)  Height: 5\' 3"  (1.6 m)   Body mass index is 23.24 kg/m.   Wt Readings from Last 3 Encounters:  11/03/22 131 lb 3.2 oz (59.5 kg)  10/29/22 130 lb (59 kg)  10/08/22 127 lb 12.8 oz (58 kg)     Ht Readings from Last 3 Encounters:  11/03/22 5\' 3"  (1.6 m)  10/29/22 5\' 3"  (1.6 m)  10/08/22 5\' 3"  (1.6 m)       General: The patient is awake, alert and appears not in acute distress. The patient is well groomed. Head: Normocephalic, atraumatic. Neck is supple. Mallampati 3,  neck circumference:14 inches . Nasal airflow is patent.  Retrognathia is mild.  Dental status: biological, crowded.  Cardiovascular:  iregular rate and cardiac rhythm by pulse,  without distended neck veins. Respiratory: Lungs are clear to auscultation.  Skin:  Without evidence of ankle edema, bruising. Petechiea. Trunk: The patient's posture is erect. The patient is awake and alert, oriented to place and time.    Attention span & concentration ability appears normal.    Speech is fluent,  without  dysarthria, dysphonia or aphasia.  Mood and affect are appropriate. She reports some memory loss- she appears rather a bit anxious and can tell me what she had for breakfast, when her last appointment was and which medications her sister takes...  she has a sister with dementia.    Cranial nerves: no loss of smell or taste.  Pupils are equal and briskly reactive to light.  Facial sensation intact to fine touch. Facial motor strength is symmetric and tongue and uvula move midline. Shoulder shrug was symmetrical.    Motor exam: she has new onset rigor and cog-wheeling in both biceps and at the wrists. Her hand have a minor tremor, not at rest, she was able to walk unassisted and shows normal arm swing, no pill rolling tremor, not stooped.  . Right hand grip strength is stronger than left, bilateral equal strength, no cogwheeling and no rigor. Sensory:  Fine touch, pinprick and vibration were reduced below the knee level, loss of primary modalities at the lateral ankle level.   Coordination: Rapid alternating movements in the fingers/hands was normal. Finger-to-nose maneuver normal without evidence of ataxia, dysmetria or tremor. Gait and station: Patient walks without assistive device- she reports propulsive tendencies when bending-  Stance is stable and normal.        ASSESSMENT AND PLAN  77 y.o. year old female  here with:    1) OSA on CPAP.  This patient underwent a home sleep test earlier this year and is now after 90 days showing good compliance and good resolution of apnea.  However her underlying condition of atrial fibrillation has returned after many attempts at cardioversion and finally an attempt to ablate.  She will hear more from her cardiologist within the next month.    2) Insomnia:  She reports insomnia she can feel palpitations and we discussed here a sleep aid that should not interfere with her EKG, should not prolong the QT time or cause bradycardia in addition to any rate controlling medication she already takes.  And my suggestion is to use 1/4 mg of Xanax at night.  If she can fall asleep without it I would certainly want her not to use it but if she has struggled for more than 30 minutes to fall asleep this medication should be taken.  3) neuropathy; I have high hopes that she will still be able to get off amiodarone in the future which has so many neurotoxic and myotoxic side effects.  I have noticed that her body mass index has still decreased, her neck circumference has decreased she has been on Ozempic and certainly did not need that drug for weight loss but has achieved diabetes control under this therapy.  I have also noticed a bruising and petechiae in her arms and these are of course related to her long-term anticoagulation therapy.    Continue CPAP and I wrote a script for 0.25 mg xanax at bedtime, prn.  I plan to follow up either personally or through our NP within 6 months.   I would like to thank Assunta Found, Md [address] [place],  [place] [address] for allowing me to meet with and to take care of this pleasant patient.    After spending a total time of  35  minutes face to face and additional time for physical and neurologic examination, review of laboratory studies,   personal review of imaging studies, reports and results of other testing and review of referral information / records as far as provided in visit,   Electronically signed by: Melvyn Novas, MD 11/03/2022 10:51 AM  [facility] Board certified by The [employer] of Sleep Medicine and Diplomate of the [employer] of [employer]. Board certified In Neurology through the ABPN, Fellow of the [employer] of [employer]. Medical Director of [facility].

## 2022-11-03 NOTE — Patient Instructions (Signed)
Atrial Fibrillation Atrial fibrillation (AFib) is a type of irregular or rapid heartbeat (arrhythmia). In AFib, the top part of the heart (atria) beats in an irregular pattern. This makes the heart unable to pump blood normally and effectively. The goal of treatment is to prevent blood clots from forming, control your heart rate, or restore your heartbeat to a normal rhythm. If this condition is not treated, it can cause serious problems, such as a weakened heart muscle (cardiomyopathy) or a stroke. What are the causes? This condition is often caused by medical conditions that damage the heart's electrical system. These include: High blood pressure (hypertension). This is the most common cause. Certain heart problems or conditions, such as heart failure, coronary artery disease, heart valve problems, or heart surgery. Diabetes. Overactive thyroid (hyperthyroidism). Chronic kidney disease. Certain lung conditions, such as emphysema, pneumonia, or COPD. Obstructive sleep apnea. In some cases, the cause of this condition is not known. What increases the risk? This condition is more likely to develop in: Older adults. Athletes who do endurance exercise. People who have a family history of AFib. Males. People who are Caucasian. People who are obese. People who smoke or misuse alcohol. What are the signs or symptoms? Symptoms of this condition include: Fast or irregular heartbeats (palpitations). Discomfort or pain in your chest. Shortness of breath. Sudden light-headedness or weakness. Tiring easily during exercise or activity. Syncope (fainting). Sweating. In some cases, there are no symptoms. How is this diagnosed? Your health care provider may detect AFib when taking your pulse. If detected, this condition may be diagnosed with: An electrocardiogram (ECG) to check electrical signals of the heart. An ambulatory cardiac monitor to record your heart's activity for a few days. A  transthoracic echocardiogram (TTE) to create pictures of your heart. A transesophageal echocardiogram (TEE) to create even clearer pictures of your heart. A stress test to check your blood supply while you exercise. Imaging tests, such as a CT scan or chest X-ray. Blood tests. How is this treated? Treatment depends on underlying conditions and how you feel when you get AFib. This condition may be treated with: Medicines to prevent blood clots or to treat heart rate or heart rhythm problems. Electrical cardioversion to reset the heart's rhythm. A pacemaker to correct abnormal heart rhythm. Ablation to remove the heart tissue that sends abnormal signals. Left atrial appendage closure to seal the area where blood clots can form. In some cases, underlying conditions will be treated. Follow these instructions at home: Medicines Take over-the counter and prescription medicines only as told by your provider. Do not take any new medicines without talking to your provider. If you are taking blood thinners: Talk with your provider before taking aspirin or NSAIDs. These medicines can raise your risk of bleeding. Take your medicines as told. Take them at the same time each day. Do not do things that could hurt or bruise you. Be careful to avoid falls. Wear an alert bracelet or carry a card that says that you take blood thinners. Lifestyle Do not use any products that contain nicotine or tobacco. These products include cigarettes, chewing tobacco, and vaping devices, such as e-cigarettes. If you need help quitting, ask your provider. Eat heart-healthy foods. Talk with a food expert (dietitian) to make an eating plan that is right for you. Exercise regularly as told by your provider. Do not drink alcohol. Lose weight if you are overweight. General instructions If you have obstructive sleep apnea, manage your condition as told by your provider.   Do not use diet pills unless your provider approves. Diet  pills can make heart problems worse. Keep all follow-up visits. Your provider will want to check your heart rate and rhythm regularly. Contact a health care provider if: You notice a change in the rate, rhythm, or strength of your heartbeat. You are taking a blood thinner and you notice more bruising. You tire more easily when you exercise or do heavy work. You have a sudden change in weight. Get help right away if:  You have chest pain. You have trouble breathing. You have side effects of blood thinners, such as blood in your vomit, poop (stool), or pee (urine), or bleeding that does not stop. You have any symptoms of a stroke. "BE FAST" is an easy way to remember the main warning signs of a stroke: B - Balance. Signs are dizziness, sudden trouble walking, or loss of balance. E - Eyes. Signs are trouble seeing or a sudden change in vision. F - Face. Signs are sudden weakness or numbness of the face, or the face or eyelid drooping on one side. A - Arms. Signs are weakness or numbness in an arm. This happens suddenly and usually on one side of the body. S - Speech.Signs are sudden trouble speaking, slurred speech, or trouble understanding what people say. T - Time. Time to call emergency services. Write down what time symptoms started. Other signs of a stroke, such as: A sudden, severe headache with no known cause. Nausea or vomiting. Seizure. These symptoms may be an emergency. Get help right away. Call 911. Do not wait to see if the symptoms will go away. Do not drive yourself to the hospital. This information is not intended to replace advice given to you by your health care provider. Make sure you discuss any questions you have with your health care provider. Document Revised: 02/25/2022 Document Reviewed: 02/25/2022 Elsevier Patient Education  2023 Elsevier Inc.  

## 2022-11-09 ENCOUNTER — Encounter: Payer: Self-pay | Admitting: Internal Medicine

## 2022-11-09 DIAGNOSIS — E113411 Type 2 diabetes mellitus with severe nonproliferative diabetic retinopathy with macular edema, right eye: Secondary | ICD-10-CM | POA: Diagnosis not present

## 2022-11-09 DIAGNOSIS — H353124 Nonexudative age-related macular degeneration, left eye, advanced atrophic with subfoveal involvement: Secondary | ICD-10-CM | POA: Diagnosis not present

## 2022-11-09 DIAGNOSIS — H34831 Tributary (branch) retinal vein occlusion, right eye, with macular edema: Secondary | ICD-10-CM | POA: Diagnosis not present

## 2022-11-09 DIAGNOSIS — H35041 Retinal micro-aneurysms, unspecified, right eye: Secondary | ICD-10-CM | POA: Diagnosis not present

## 2022-11-09 DIAGNOSIS — H353111 Nonexudative age-related macular degeneration, right eye, early dry stage: Secondary | ICD-10-CM | POA: Diagnosis not present

## 2022-11-09 DIAGNOSIS — H348122 Central retinal vein occlusion, left eye, stable: Secondary | ICD-10-CM | POA: Diagnosis not present

## 2022-11-09 LAB — HM DIABETES EYE EXAM

## 2022-11-09 NOTE — Progress Notes (Signed)
Remote pacemaker transmission.   

## 2022-11-18 ENCOUNTER — Ambulatory Visit: Payer: PPO | Attending: Cardiology | Admitting: Cardiology

## 2022-11-18 ENCOUNTER — Encounter: Payer: Self-pay | Admitting: Cardiology

## 2022-11-18 VITALS — BP 144/84 | HR 83 | Ht 63.0 in | Wt 130.0 lb

## 2022-11-18 DIAGNOSIS — I2510 Atherosclerotic heart disease of native coronary artery without angina pectoris: Secondary | ICD-10-CM | POA: Diagnosis not present

## 2022-11-18 DIAGNOSIS — I10 Essential (primary) hypertension: Secondary | ICD-10-CM | POA: Diagnosis not present

## 2022-11-18 DIAGNOSIS — Z79899 Other long term (current) drug therapy: Secondary | ICD-10-CM | POA: Diagnosis not present

## 2022-11-18 DIAGNOSIS — I4819 Other persistent atrial fibrillation: Secondary | ICD-10-CM | POA: Diagnosis not present

## 2022-11-18 DIAGNOSIS — D6869 Other thrombophilia: Secondary | ICD-10-CM

## 2022-11-18 DIAGNOSIS — I495 Sick sinus syndrome: Secondary | ICD-10-CM | POA: Diagnosis not present

## 2022-11-18 DIAGNOSIS — I484 Atypical atrial flutter: Secondary | ICD-10-CM

## 2022-11-18 NOTE — H&P (View-Only) (Signed)
 Electrophysiology Office Note   Date:  11/18/2022   ID:  Virginia Huber, DOB 12/18/1945, MRN [id number]  PCP:  Golding, John, MD  Cardiologist:  Koneswaran Primary Electrophysiologist:  Marigold Mom Martin Ashia Dehner, MD    No chief complaint on file.     History of Present Illness: Virginia Huber is a 76 y.o. female who presents today for electrophysiology evaluation.     She has a history significant coronary artery disease post PCI to the OM with STEMI in 2016, PCI to the circumflex non-STEMI in 2017, PCI to the RCA.  She has atrial fibrillation and sleep apnea.  She is post Medtronic dual-chamber pacemaker implanted 06/25/2016.  She has had episodes of atrial fibrillation.  She is on amiodarone.  She is post ablation 08/14/2022.  Today, denies symptoms of chest pain, orthopnea, PND, lower extremity edema, claudication, dizziness, presyncope, syncope, bleeding, or neurologic sequela. The patient is tolerating medications without difficulties.  She currently has palpitations, fatigue, shortness of breath.  She is in atrial flutter currently.  She has continued to have quite a bit of her ablation.  She feels quite poorly today.   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 h/o PAF/flutter during admission in 2013 for PNA. b. PAF during adm for NSTEMI 07/2015, subsequent paroxysms since then.   B12 deficiency anemia    Branch retinal vein occlusion with macular edema of right eye 10/11/2019   Chronic renal failure, stage 3b (HCC) 09/23/2017   Coronary artery disease 11/30/2014   a. remote MI. b. h/o PTCA with scoring balloon to OM1 11/2014. c. NSTEMI 03/2015 s/p DES to prox-mid Cx. d. 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 s/p LE angio 2015; followed by Dr. Arida - managed medically.   Pericardial effusion    a. 06/2016 after ppm - s/p pericardiocentesis.   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 s/p Medtronic PPM 06/2016, c/b lead perf/pericardial effusion.   TIA (transient ischemic attack)    Type 2 diabetes with nephropathy (HCC) 02/29/2012   Past Surgical History:  Procedure Laterality Date   ABDOMINAL AORTAGRAM N/A 01/03/2014   Procedure: ABDOMINAL AORTAGRAM;  Surgeon: Muhammad A Arida, MD;  Location: [facility];  Service: Cardiovascular;  Laterality: N/A;   ABDOMINAL HYSTERECTOMY  06/22/1970   "partial"     APPENDECTOMY  02/20/1969   ATRIAL FIBRILLATION ABLATION N/A 08/14/2022   Procedure: ATRIAL FIBRILLATION ABLATION;  Surgeon: Adria Costley Martin, MD;  Location: [facility];  Service: Cardiovascular;  Laterality: N/A;   CARDIAC CATHETERIZATION  06/22/2006   Tiny OM-2 with 90% narrowing. Med tx.   CARDIAC CATHETERIZATION N/A 11/30/2014   Procedure: Left Heart  Cath and Coronary Angiography;  Surgeon: Thomas A Kelly, MD; LAD 20%, CFX 50%, OM1 95%, right PLB 30%, LV normal    CARDIAC CATHETERIZATION N/A 11/30/2014   Procedure: Coronary Balloon Angioplasty;  Surgeon: Thomas A Kelly, MD;  Angiosculpt scoring balloon and PTCA to the OM1 reducing stenosis from 95% to less than 10%   CARDIAC CATHETERIZATION N/A 04/03/2015   Procedure: Left Heart Cath and Coronary Angiography;  Surgeon: Daniel R Bensimhon, MD; dLAD 50%, CFX 90%, OM1 100%, PLA 15%, LVEDP 13     CARDIAC CATHETERIZATION N/A 04/03/2015   Procedure: Coronary Stent Intervention;  Surgeon: Michael Cooper, MD; 3.0x18 mm Xience DES to the CFX     CARDIAC CATHETERIZATION N/A 08/02/2015   Procedure: Left Heart Cath and Coronary Angiography;  Surgeon: Thomas A Kelly, MD;  [facility]: [facility];  Service: Cardiovascular;  Laterality: N/A;   CARDIAC CATHETERIZATION N/A 08/02/2015   Procedure: Coronary Stent Intervention;  Surgeon: Thomas A Kelly, MD;  Location: [facility];  Service: Cardiovascular;  Laterality: N/A;   CARDIAC CATHETERIZATION N/A 06/25/2016   Procedure: Pericardiocentesis;  Surgeon: Floyde Dingley Martin De Jaworski, MD;  Location: [facility];  Service: Cardiovascular;  Laterality: N/A;   cardiac stents     CARDIOVERSION N/A 12/15/2017   Procedure: CARDIOVERSION;  Surgeon: Nahser, Philip J, MD;  Location: [facility];  Service: Cardiovascular;  Laterality: N/A;   CARDIOVERSION N/A 05/12/2022   Procedure: CARDIOVERSION;  Surgeon: Turner, Traci R, MD;  Location: [facility];  Service: Cardiovascular;  Laterality: N/A;   CARDIOVERSION N/A 09/30/2022   Procedure: CARDIOVERSION;  Surgeon: Croitoru, Mihai, MD;  Location: [facility];  Service: Cardiovascular;  Laterality: N/A;   CARDIOVERSION N/A 10/29/2022   Procedure: CARDIOVERSION;  Surgeon: Turner, Traci R, MD;  [facility]: [facility];  Service: Cardiovascular;  Laterality: N/A;   CHOLECYSTECTOMY OPEN  02/20/1989    COLONOSCOPY  06/23/2003   Dr. Rehman: pancolonic divericula, polyp, path unknown currently   COLONOSCOPY  06/22/2010   Dr. Fields: Normal TI, scattered diverticula in entire colon, small internal hemorrhoids, normal colon biopsies. Colonoscopy in 5-10 years.    COLONOSCOPY WITH PROPOFOL N/A 06/02/2021   pancolonic diverticulosis. Two 4-6 mm polyps in transverse colon. Sessile serrated and hyperplastic. 5 year surveillance if benefits outweigh the risks.   COLOSTOMY  05/23/1979   COLOSTOMY REVERSAL  11/21/1979   EP IMPLANTABLE DEVICE N/A 06/25/2016   Procedure: Lead Revision/Repair;  Surgeon: Analeigha Nauman Martin Muhamed Luecke, MD;  Location: [facility];  Service: Cardiovascular;  Laterality: N/A;   EP IMPLANTABLE DEVICE N/A 06/25/2016   Procedure: Pacemaker Implant;  Surgeon: Jossue Rubenstein Martin Kemonte Ullman, MD;  [facility]: [facility];  Service: Cardiovascular;  Laterality: N/A;   EXCISIONAL HEMORRHOIDECTOMY  02/20/1969   EYE SURGERY Left 06/22/1998   "branch vein occlusion"   EYE SURGERY Left ~ 2001   "smoothed out wrinkle"   LEFT HEART CATH AND CORONARY ANGIOGRAPHY N/A 05/11/2022   Procedure: LEFT HEART CATH AND CORONARY ANGIOGRAPHY;  Surgeon: McAlhany, Christopher D, MD;  [facility]: [facility];  Service: Cardiovascular;  Laterality: N/A;   LEFT OOPHORECTOMY  05/23/1979     nicked bowel, peritonitis, colostomy; colostomy reversed 1981    LOWER EXTREMITY ANGIOGRAM N/A 01/03/2014   Procedure: LOWER EXTREMITY ANGIOGRAM;  Surgeon: Muhammad A Arida, MD;  Location: [facility];  Service: Cardiovascular;  Laterality: N/A;   Nuclear med stress test  10/21/2011   Small area of mild ischemia inferoapically.   PARTIAL HYSTERECTOMY  02/20/1969   left ovaries, then ovaries removed later due tumors    POLYPECTOMY  06/02/2021   Procedure: POLYPECTOMY;  Surgeon: Carver, Charles K, DO;  Location: [facility];  Service: Endoscopy;;   right eye surgery  03/2021   RIGHT OOPHORECTOMY  02/20/1969   TEE WITHOUT  CARDIOVERSION N/A 05/12/2022   Procedure: TRANSESOPHAGEAL ECHOCARDIOGRAM (TEE);  Surgeon: Turner, Traci R, MD;  Location: [facility];  Service: Cardiovascular;  Laterality: N/A;   TEE WITHOUT CARDIOVERSION N/A 08/14/2022   Procedure: TRANSESOPHAGEAL ECHOCARDIOGRAM;  Surgeon: Catherina Pates Martin, MD;  Location: [facility];  Service: Cardiovascular;  Laterality: N/A;     Current Outpatient Medications  Medication Sig Dispense Refill   acetaminophen (TYLENOL) 500 MG tablet Take 1,000 mg by mouth every 8 (eight) hours as needed for headache, moderate pain or mild pain.     ALPRAZolam (XANAX) 0.25 MG tablet Take 1 tablet (0.25 mg total) by mouth at bedtime as needed for anxiety. 30 tablet 1   amiodarone (PACERONE) 200 MG tablet Take 1 tablet by mouth twice a day for 21 days then reduce back to 1 tablet daily 51 tablet 0   apixaban (ELIQUIS) 5 MG TABS tablet TAKE ONE TABLET BY MOUTH TWICE DAILY 60 tablet 5   atorvastatin (LIPITOR) 80 MG tablet TAKE ONE TABLET BY MOUTH EVERY DAY AT 6:00PM 90 tablet 3   calcitRIOL (ROCALTROL) 0.25 MCG capsule Take 0.25 mcg by mouth every Monday, Wednesday, and Friday.     carvedilol (COREG) 25 MG tablet Take 1 tablet (25 mg total) by mouth 2 (two) times daily. 60 tablet 6   Cholecalciferol 50 MCG (2000 UT) TABS Take 4,000 Units by mouth at bedtime.     clopidogrel (PLAVIX) 75 MG tablet TAKE ONE TABLET (75MG TOTAL) BY MOUTH DAILY 90 tablet 3   Continuous Blood Gluc Receiver (FREESTYLE LIBRE 2 READER) DEVI 1 each by Does not apply route daily. 1 each 0   Continuous Blood Gluc Sensor (FREESTYLE LIBRE 2 SENSOR) MISC USE ONE FOR FOURTEEN DAYS 6 each 3   furosemide (LASIX) 40 MG tablet Take 40 mg by mouth daily as needed for fluid or edema.     glucose blood (FREESTYLE PRECISION NEO TEST) test strip Use as instructed 100 each 3   Insulin NPH, Human,, Isophane, (NOVOLIN N FLEXPEN) 100 UNIT/ML Kiwkpen Inject 10-15 Units into the skin as directed. Inject 15 units  subcutaneously in the morning & inject 10 units subcutaneously at night.     levothyroxine (SYNTHROID) 112 MCG tablet TAKE ONE TABLET BY MOUTH ONCE DAILY 90 tablet 2   nitroGLYCERIN (NITROSTAT) 0.4 MG SL tablet Place 1 tablet (0.4 mg total) under the tongue every 5 (five) minutes x 3 doses as needed for chest pain (If not relief after 3rd dose, call 911 or go to ED). 25 tablet 0   rOPINIRole (REQUIP) 0.5 MG tablet Take 1 tablet (0.5 mg total) by mouth at bedtime. 30 tablet 5   Semaglutide, 2 MG/DOSE, 8 MG/3ML SOPN Inject 2 mg as directed once a week. (Patient taking differently: Inject 2 mg into the skin every Sunday.) 9 mL 3   No   current facility-administered medications for this visit.    Allergies:   Penicillins and Percocet [oxycodone-acetaminophen]   Social History:  The patient  reports that she has never smoked. She has never been exposed to tobacco smoke. She has never used smokeless tobacco. She reports that she does not drink alcohol and does not use drugs.   Family History:  The patient's family history includes Diabetes in her brother; Heart disease in her brother, brother, father, mother, and sister; Lupus in her daughter; Thyroid disease in her brother and brother.   ROS:  Please see the history of present illness.   Otherwise, review of systems is positive for none.   All other systems are reviewed and negative.   PHYSICAL EXAM: VS:  BP (!) 144/84   Pulse 83   Ht 5' 3" (1.6 m)   Wt 130 lb (59 kg)   SpO2 98%   BMI 23.03 kg/m  , BMI Body mass index is 23.03 kg/m. GEN: Well nourished, well developed, in no acute distress  HEENT: normal  Neck: no JVD, carotid bruits, or masses Cardiac: irregular; no murmurs, rubs, or gallops,no edema  Respiratory:  clear to auscultation bilaterally, normal work of breathing GI: soft, nontender, nondistended, + BS MS: no deformity or atrophy  Skin: warm and dry, device site well healed Neuro:  Strength and sensation are intact Psych:  euthymic mood, full affect  EKG:  EKG is ordered today. Personal review of the ekg ordered shows atrial flutter  Personal review of the device interrogation today. Results in [facility]   Recent Labs: 05/10/2022: Magnesium 1.8 09/25/2022: ALT 76; TSH 2.100 10/23/2022: BUN 18; Creatinine, Ser 1.47; Hemoglobin 12.9; Platelets 243; Potassium 4.0; Sodium 143    Lipid Panel     Component Value Date/Time   CHOL 96 05/10/2022 0158   TRIG 156 (H) 05/10/2022 0158   HDL 30 (L) 05/10/2022 0158   CHOLHDL 3.2 05/10/2022 0158   VLDL 31 05/10/2022 0158   LDLCALC 35 05/10/2022 0158   LDLCALC 96 08/05/2017 0807     Wt Readings from Last 3 Encounters:  11/18/22 130 lb (59 kg)  11/03/22 131 lb 3.2 oz (59.5 kg)  10/29/22 130 lb (59 kg)      Other studies Reviewed: Additional studies/ records that were reviewed today include: TTE 08/04/17 Review of the above records today demonstrates:  - Left ventricle: The cavity size was normal. Wall thickness was   increased in a pattern of moderate LVH. Systolic function was   normal. The estimated ejection fraction was in the range of 55%   to 60%. Wall motion was normal; there were no regional wall   motion abnormalities. Features are consistent with a pseudonormal   left ventricular filling pattern, with concomitant abnormal   relaxation and increased filling pressure (grade 2 diastolic   dysfunction). Doppler parameters are consistent with high   ventricular filling pressure. - Left atrium: The atrium was severely dilated. - Right ventricle: Pacer wire or catheter noted in right ventricle. - Right atrium: Pacer wire or catheter noted in right atrium.  Cath  08/02/15 1st RPLB lesion, 50% stenosed. Dist RCA lesion, 30% stenosed. Ost 1st Mrg to 1st Mrg lesion, 100% stenosed. Ost LAD lesion, 40% stenosed. Mid LAD lesion, 40% stenosed. Post Atrio lesion, 90% stenosed. Post intervention, there is a 0% residual stenosis. The left ventricular systolic  function is normal. Mid RCA lesion, 20% stenosed.   Normal LV function without focal segmental wall motion abnormalities and ejection fraction of   55%.   Successful percutaneous cardiac intervention to the distal RCA treated with Angiosculpt scoring balloon, PTCA, and ultimate stenting with a 2.58 mm Xience Alpine DES stent postdilated to 2.51 mm with a 90% stenosis reduced to 0% and no change in the ostial PDA narrowing.  ASSESSMENT AND PLAN:  1.  Persistent atrial fibrillation/flutter: Currently on amiodarone and Eliquis.  CHA2DS2-VASc of 7.  Is status post ablation 08/14/2022.  She is unfortunately in atrial flutter today.  She feels quite poorly with fatigue, shortness of breath, palpitations.  She would prefer a rhythm control strategy.  She is already on amiodarone.  Repeat cardioversion would likely be unsuccessful long-term.  Tokiko Diefenderfer plan for repeat ablation.  Risk, benefits, and alternatives to EP study and radiofrequency ablation for afib were also discussed in detail today. These risks include but are not limited to stroke, bleeding, vascular damage, tamponade, perforation, damage to the esophagus, lungs, and other structures, pulmonary vein stenosis, worsening renal function, and death. The patient understands these risk and wishes to proceed.  We Maelie Chriswell therefore proceed with catheter ablation at the next available time.  Carto, ICE, anesthesia are requested for the procedure.  Atia Haupt also obtain CT PV protocol prior to the procedure to exclude LAA thrombus and further evaluate atrial anatomy.   2.  Sick sinus syndrome: Status post Medtronic dual-chamber pacemaker.  Device functioning appropriately.  No changes.  3.  Coronary artery disease: No current chest pain.  Continue with current management.  4.  Hypertension: Mildly elevated, usually well-controlled.  No changes.  5.  Second hypercoagulable state: Currently on Eliquis for atrial fibrillation  6.  High risk medication monitoring:  Currently on amiodarone.  Recent labs within normal limits.  Current medicines are reviewed at length with the patient today.   The patient does not have concerns regarding her medicines.  none  Labs/ tests ordered today include:  Orders Placed This Encounter  Procedures   EKG 12-Lead      Disposition:   FU 6 months  Signed, Zeppelin Beckstrand Martin Gwen Sarvis, MD  11/18/2022 11:57 AM     [facility] [address] [place] [address] [phone] (office) [phone] (fax) 

## 2022-11-18 NOTE — Patient Instructions (Addendum)
Medication Instructions:  Your physician recommends that you continue on your current medications as directed. Please refer to the Current Medication list given to you today.  *If you need a refill on your cardiac medications before your next appointment, please call your pharmacy*   Lab Work: Pre procedure labs -- see procedure instruction letter:  BMP & CBC  If you have labs (blood work) drawn today and your tests are completely normal, you will receive your results only by: MyChart Message (if you have MyChart) OR A paper copy in the mail If you have any lab test that is abnormal or we need to change your treatment, we will call you to review the results.   Testing/Procedures: Your physician has requested that you have cardiac CT within 7 days PRIOR to your ablation. Cardiac computed tomography (CT) is a painless test that uses an x-ray machine to take clear, detailed pictures of your heart.  Please follow instruction below located under "other instructions". You will get a call from our office to schedule the date for this test.  Your physician has recommended that you have an ablation. Catheter ablation is a medical procedure used to treat some cardiac arrhythmias (irregular heartbeats). During catheter ablation, a long, thin, flexible tube is put into a blood vessel in your groin (upper thigh), or neck. This tube is called an ablation catheter. It is then guided to your heart through the blood vessel. Radio frequency waves destroy small areas of heart tissue where abnormal heartbeats may cause an arrhythmia to start.   You will be scheduled for 10/15 (you are first on the wait list if someone cancels).  The EP scheduler, April, will be in contact to go over instructions and send via mychart   Follow-Up: At [facility], you and your health needs are our priority.  As part of our continuing mission to provide you with exceptional heart care, we have created designated Provider Care  Teams.  These Care Teams include your primary Cardiologist (physician) and Advanced Practice Providers (APPs -  Physician Assistants and Nurse Practitioners) who all work together to provide you with the care you need, when you need it.   Your next appointment:   1 month(s) after your ablation  The format for your next appointment:   In Person  Provider:   [facility] clinic   Thank you for choosing [facility]!!   Dory Horn, RN [phone]    Other Instructions   Cardiac Ablation Cardiac ablation is a procedure to destroy (ablate) some heart tissue that is sending bad signals. These bad signals cause problems in heart rhythm. The heart has many areas that make these signals. If there are problems in these areas, they can make the heart beat in a way that is not normal. Destroying some tissues can help make the heart rhythm normal. Tell your doctor about: Any allergies you have. All medicines you are taking. These include vitamins, herbs, eye drops, creams, and over-the-counter medicines. Any problems you or family members have had with medicines that make you fall asleep (anesthetics). Any blood disorders you have. Any surgeries you have had. Any medical conditions you have, such as kidney failure. Whether you are pregnant or may be pregnant. What are the risks? This is a safe procedure. But problems may occur, including: Infection. Bruising and bleeding. Bleeding into the chest. Stroke or blood clots. Damage to nearby areas of your body. Allergies to medicines or dyes. The need for a pacemaker if the normal  system is damaged. Failure of the procedure to treat the problem. What happens before the procedure? Medicines Ask your doctor about: Changing or stopping your normal medicines. This is important. Taking aspirin and ibuprofen. Do not take these medicines unless your doctor tells you to take them. Taking other medicines, vitamins, herbs, and  supplements. General instructions Follow instructions from your doctor about what you cannot eat or drink. Plan to have someone take you home from the hospital or clinic. If you will be going home right after the procedure, plan to have someone with you for 24 hours. Ask your doctor what steps will be taken to prevent infection. What happens during the procedure?  An IV tube will be put into one of your veins. You will be given a medicine to help you relax. The skin on your neck or groin will be numbed. A cut (incision) will be made in your neck or groin. A needle will be put through your cut and into a large vein. A tube (catheter) will be put into the needle. The tube will be moved to your heart. Dye may be put through the tube. This helps your doctor see your heart. Small devices (electrodes) on the tube will send out signals. A type of energy will be used to destroy some heart tissue. The tube will be taken out. Pressure will be held on your cut. This helps stop bleeding. A bandage will be put over your cut. The exact procedure may vary among doctors and hospitals. What happens after the procedure? You will be watched until you leave the hospital or clinic. This includes checking your heart rate, breathing rate, oxygen, and blood pressure. Your cut will be watched for bleeding. You will need to lie still for a few hours. Do not drive for 24 hours or as long as your doctor tells you. Summary Cardiac ablation is a procedure to destroy some heart tissue. This is done to treat heart rhythm problems. Tell your doctor about any medical conditions you may have. Tell him or her about all medicines you are taking to treat them. This is a safe procedure. But problems may occur. These include infection, bruising, bleeding, and damage to nearby areas of your body. Follow what your doctor tells you about food and drink. You may also be told to change or stop some of your medicines. After the  procedure, do not drive for 24 hours or as long as your doctor tells you. This information is not intended to replace advice given to you by your health care provider. Make sure you discuss any questions you have with your health care provider. Document Revised: 08/29/2021 Document Reviewed: 05/11/2019 Elsevier Patient Education  2023 [employer].

## 2022-11-18 NOTE — Addendum Note (Signed)
Addended by: Baird Lyons on: 11/18/2022 12:09 PM   Modules accepted: Orders

## 2022-11-18 NOTE — Progress Notes (Signed)
Electrophysiology Office Note   Date:  11/18/2022   ID:  Virginia Huber 1945/10/06, MRN [id number]  PCP:  Assunta Found, MD  Cardiologist:  Purvis Sheffield Primary Electrophysiologist:  Payton Moder Jorja Loa, MD    No chief complaint on file.     History of Present Illness: Virginia Huber is a 77 y.o. female who presents today for electrophysiology evaluation.     She has a history significant coronary artery disease post PCI to the OM with STEMI in 2016, PCI to the circumflex non-STEMI in 2017, PCI to the RCA.  She has atrial fibrillation and sleep apnea.  She is post Medtronic dual-chamber pacemaker implanted 06/25/2016.  She has had episodes of atrial fibrillation.  She is on amiodarone.  She is post ablation 08/14/2022.  Today, denies symptoms of chest pain, orthopnea, PND, lower extremity edema, claudication, dizziness, presyncope, syncope, bleeding, or neurologic sequela. The patient is tolerating medications without difficulties.  She currently has palpitations, fatigue, shortness of breath.  She is in atrial flutter currently.  She has continued to have quite a bit of her ablation.  She feels quite poorly today.   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 h/o PAF/flutter during admission in 2013 for PNA. b. PAF during adm for NSTEMI 07/2015, subsequent paroxysms since then.   B12 deficiency anemia    Branch retinal vein occlusion with macular edema of right eye 10/11/2019   Chronic renal failure, stage 3b (HCC) 09/23/2017   Coronary artery disease 11/30/2014   a. remote MI. b. h/o PTCA with scoring balloon to OM1 11/2014. c. NSTEMI 03/2015 s/p DES to prox-mid Cx. d. 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 s/p LE angio 2015; followed by Dr. Kirke Corin - managed medically.   Pericardial effusion    a. 06/2016 after ppm - s/p pericardiocentesis.   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 s/p Medtronic PPM 06/2016, c/b lead perf/pericardial effusion.   TIA (transient ischemic attack)    Type 2 diabetes with nephropathy (HCC) 02/29/2012   Past Surgical History:  Procedure Laterality Date   ABDOMINAL AORTAGRAM N/A 01/03/2014   Procedure: ABDOMINAL Ronny Flurry;  Surgeon: Iran Ouch, MD;  Location: [facility] CATH LAB;  Service: Cardiovascular;  Laterality: N/A;   ABDOMINAL HYSTERECTOMY  06/22/1970   "partial"  APPENDECTOMY  02/20/1969   ATRIAL FIBRILLATION ABLATION N/A 08/14/2022   Procedure: ATRIAL FIBRILLATION ABLATION;  Surgeon: Regan Lemming, MD;  Location: [facility] INVASIVE CV LAB;  Service: Cardiovascular;  Laterality: N/A;   CARDIAC CATHETERIZATION  06/22/2006   Tiny OM-2 with 90% narrowing. Med tx.   CARDIAC CATHETERIZATION N/A 11/30/2014   Procedure: Left Heart  Cath and Coronary Angiography;  Surgeon: Lennette Bihari, MD; LAD 20%, CFX 50%, OM1 95%, right PLB 30%, LV normal    CARDIAC CATHETERIZATION N/A 11/30/2014   Procedure: Coronary Balloon Angioplasty;  Surgeon: Lennette Bihari, MD;  Angiosculpt scoring balloon and PTCA to the OM1 reducing stenosis from 95% to less than 10%   CARDIAC CATHETERIZATION N/A 04/03/2015   Procedure: Left Heart Cath and Coronary Angiography;  Surgeon: Dolores Patty, MD; dLAD 50%, CFX 90%, OM1 100%, PLA 15%, LVEDP 13     CARDIAC CATHETERIZATION N/A 04/03/2015   Procedure: Coronary Stent Intervention;  Surgeon: Tonny Bollman, MD; 3.0x18 mm Xience DES to the CFX     CARDIAC CATHETERIZATION N/A 08/02/2015   Procedure: Left Heart Cath and Coronary Angiography;  Surgeon: Lennette Bihari, MD;  Location: [facility] INVASIVE CV LAB;  Service: Cardiovascular;  Laterality: N/A;   CARDIAC CATHETERIZATION N/A 08/02/2015   Procedure: Coronary Stent Intervention;  Surgeon: Lennette Bihari, MD;  Location: [facility] INVASIVE CV LAB;  Service: Cardiovascular;  Laterality: N/A;   CARDIAC CATHETERIZATION N/A 06/25/2016   Procedure: Pericardiocentesis;  Surgeon: Yvette Loveless Jorja Loa, MD;  Location: [facility] INVASIVE CV LAB;  Service: Cardiovascular;  Laterality: N/A;   cardiac stents     CARDIOVERSION N/A 12/15/2017   Procedure: CARDIOVERSION;  Surgeon: Elease Hashimoto Deloris Ping, MD;  Location: [facility] ENDOSCOPY;  Service: Cardiovascular;  Laterality: N/A;   CARDIOVERSION N/A 05/12/2022   Procedure: CARDIOVERSION;  Surgeon: Quintella Reichert, MD;  Location: [facility] ENDOSCOPY;  Service: Cardiovascular;  Laterality: N/A;   CARDIOVERSION N/A 09/30/2022   Procedure: CARDIOVERSION;  Surgeon: Thurmon Fair, MD;  Location: [facility] INVASIVE CV LAB;  Service: Cardiovascular;  Laterality: N/A;   CARDIOVERSION N/A 10/29/2022   Procedure: CARDIOVERSION;  Surgeon: Quintella Reichert, MD;  Location: [facility] INVASIVE CV LAB;  Service: Cardiovascular;  Laterality: N/A;   CHOLECYSTECTOMY OPEN  02/20/1989    COLONOSCOPY  06/23/2003   Dr. Karilyn Cota: pancolonic divericula, polyp, path unknown currently   COLONOSCOPY  06/22/2010   Dr. Darrick Penna: Normal TI, scattered diverticula in entire colon, small internal hemorrhoids, normal colon biopsies. Colonoscopy in 5-10 years.    COLONOSCOPY WITH PROPOFOL N/A 06/02/2021   pancolonic diverticulosis. Two 4-6 mm polyps in transverse colon. Sessile serrated and hyperplastic. 5 year surveillance if benefits outweigh the risks.   COLOSTOMY  05/23/1979   COLOSTOMY REVERSAL  11/21/1979   EP IMPLANTABLE DEVICE N/A 06/25/2016   Procedure: Lead Revision/Repair;  Surgeon: Ouita Nish Jorja Loa, MD;  Location: [facility] INVASIVE CV LAB;  Service: Cardiovascular;  Laterality: N/A;   EP IMPLANTABLE DEVICE N/A 06/25/2016   Procedure: Pacemaker Implant;  Surgeon: Teddy Rebstock Jorja Loa, MD;  Location: [facility] INVASIVE CV LAB;  Service: Cardiovascular;  Laterality: N/A;   EXCISIONAL HEMORRHOIDECTOMY  02/20/1969   EYE SURGERY Left 06/22/1998   "branch vein occlusion"   EYE SURGERY Left ~ 2001   "smoothed out wrinkle"   LEFT HEART CATH AND CORONARY ANGIOGRAPHY N/A 05/11/2022   Procedure: LEFT HEART CATH AND CORONARY ANGIOGRAPHY;  Surgeon: Kathleene Hazel, MD;  Location: [facility] INVASIVE CV LAB;  Service: Cardiovascular;  Laterality: N/A;   LEFT OOPHORECTOMY  05/23/1979  nicked bowel, peritonitis, colostomy; colostomy reversed 1981    LOWER EXTREMITY ANGIOGRAM N/A 01/03/2014   Procedure: LOWER EXTREMITY ANGIOGRAM;  Surgeon: Iran Ouch, MD;  Location: [facility] CATH LAB;  Service: Cardiovascular;  Laterality: N/A;   Nuclear med stress test  10/21/2011   Small area of mild ischemia inferoapically.   PARTIAL HYSTERECTOMY  02/20/1969   left ovaries, then ovaries removed later due tumors    POLYPECTOMY  06/02/2021   Procedure: POLYPECTOMY;  Surgeon: Lanelle Bal, DO;  Location: [facility] ENDO [facility];  Service: Endoscopy;;   right eye surgery  03/2021   RIGHT OOPHORECTOMY  02/20/1969   TEE WITHOUT  CARDIOVERSION N/A 05/12/2022   Procedure: TRANSESOPHAGEAL ECHOCARDIOGRAM (TEE);  Surgeon: Quintella Reichert, MD;  Location: [facility] ENDOSCOPY;  Service: Cardiovascular;  Laterality: N/A;   TEE WITHOUT CARDIOVERSION N/A 08/14/2022   Procedure: TRANSESOPHAGEAL ECHOCARDIOGRAM;  Surgeon: Regan Lemming, MD;  Location: [facility] INVASIVE CV LAB;  Service: Cardiovascular;  Laterality: N/A;     Current Outpatient Medications  Medication Sig Dispense Refill   acetaminophen (TYLENOL) 500 MG tablet Take 1,000 mg by mouth every 8 (eight) hours as needed for headache, moderate pain or mild pain.     ALPRAZolam (XANAX) 0.25 MG tablet Take 1 tablet (0.25 mg total) by mouth at bedtime as needed for anxiety. 30 tablet 1   amiodarone (PACERONE) 200 MG tablet Take 1 tablet by mouth twice a day for 21 days then reduce back to 1 tablet daily 51 tablet 0   apixaban (ELIQUIS) 5 MG TABS tablet TAKE ONE TABLET BY MOUTH TWICE DAILY 60 tablet 5   atorvastatin (LIPITOR) 80 MG tablet TAKE ONE TABLET BY MOUTH EVERY DAY AT 6:00PM 90 tablet 3   calcitRIOL (ROCALTROL) 0.25 MCG capsule Take 0.25 mcg by mouth every Monday, Wednesday, and Friday.     carvedilol (COREG) 25 MG tablet Take 1 tablet (25 mg total) by mouth 2 (two) times daily. 60 tablet 6   Cholecalciferol 50 MCG (2000 UT) TABS Take 4,000 Units by mouth at bedtime.     clopidogrel (PLAVIX) 75 MG tablet TAKE ONE TABLET (75MG  TOTAL) BY MOUTH DAILY 90 tablet 3   Continuous Blood Gluc Receiver (FREESTYLE LIBRE 2 READER) DEVI 1 each by Does not apply route daily. 1 each 0   Continuous Blood Gluc Sensor (FREESTYLE LIBRE 2 SENSOR) MISC USE ONE FOR FOURTEEN DAYS 6 each 3   furosemide (LASIX) 40 MG tablet Take 40 mg by mouth daily as needed for fluid or edema.     glucose blood (FREESTYLE PRECISION NEO TEST) test strip Use as instructed 100 each 3   Insulin NPH, Human,, Isophane, (NOVOLIN N FLEXPEN) 100 UNIT/ML Kiwkpen Inject 10-15 Units into the skin as directed. Inject 15 units  subcutaneously in the morning & inject 10 units subcutaneously at night.     levothyroxine (SYNTHROID) 112 MCG tablet TAKE ONE TABLET BY MOUTH ONCE DAILY 90 tablet 2   nitroGLYCERIN (NITROSTAT) 0.4 MG SL tablet Place 1 tablet (0.4 mg total) under the tongue every 5 (five) minutes x 3 doses as needed for chest pain (If not relief after 3rd dose, call 911 or go to ED). 25 tablet 0   rOPINIRole (REQUIP) 0.5 MG tablet Take 1 tablet (0.5 mg total) by mouth at bedtime. 30 tablet 5   Semaglutide, 2 MG/DOSE, 8 MG/3ML SOPN Inject 2 mg as directed once a week. (Patient taking differently: Inject 2 mg into the skin every Sunday.) 9 mL 3   No  current facility-administered medications for this visit.    Allergies:   Penicillins and Percocet [oxycodone-acetaminophen]   Social History:  The patient  reports that she has never smoked. She has never been exposed to tobacco smoke. She has never used smokeless tobacco. She reports that she does not drink alcohol and does not use drugs.   Family History:  The patient's family history includes Diabetes in her brother; Heart disease in her brother, brother, father, mother, and sister; Lupus in her daughter; Thyroid disease in her brother and brother.   ROS:  Please see the history of present illness.   Otherwise, review of systems is positive for none.   All other systems are reviewed and negative.   PHYSICAL EXAM: VS:  BP (!) 144/84   Pulse 83   Ht 5\' 3"  (1.6 m)   Wt 130 lb (59 kg)   SpO2 98%   BMI 23.03 kg/m  , BMI Body mass index is 23.03 kg/m. GEN: Well nourished, well developed, in no acute distress  HEENT: normal  Neck: no JVD, carotid bruits, or masses Cardiac: irregular; no murmurs, rubs, or gallops,no edema  Respiratory:  clear to auscultation bilaterally, normal work of breathing GI: soft, nontender, nondistended, + BS MS: no deformity or atrophy  Skin: warm and dry, device site well healed Neuro:  Strength and sensation are intact Psych:  euthymic mood, full affect  EKG:  EKG is ordered today. Personal review of the ekg ordered shows atrial flutter  Personal review of the device interrogation today. Results in [facility]   Recent Labs: 05/10/2022: Magnesium 1.8 09/25/2022: ALT 76; TSH 2.100 10/23/2022: BUN 18; Creatinine, Ser 1.47; Hemoglobin 12.9; Platelets 243; Potassium 4.0; Sodium 143    Lipid Panel     Component Value Date/Time   CHOL 96 05/10/2022 0158   TRIG 156 (H) 05/10/2022 0158   HDL 30 (L) 05/10/2022 0158   CHOLHDL 3.2 05/10/2022 0158   VLDL 31 05/10/2022 0158   LDLCALC 35 05/10/2022 0158   LDLCALC 96 08/05/2017 0807     Wt Readings from Last 3 Encounters:  11/18/22 130 lb (59 kg)  11/03/22 131 lb 3.2 oz (59.5 kg)  10/29/22 130 lb (59 kg)      Other studies Reviewed: Additional studies/ records that were reviewed today include: TTE 08/04/17 Review of the above records today demonstrates:  - Left ventricle: The cavity size was normal. Wall thickness was   increased in a pattern of moderate LVH. Systolic function was   normal. The estimated ejection fraction was in the range of 55%   to 60%. Wall motion was normal; there were no regional wall   motion abnormalities. Features are consistent with a pseudonormal   left ventricular filling pattern, with concomitant abnormal   relaxation and increased filling pressure (grade 2 diastolic   dysfunction). Doppler parameters are consistent with high   ventricular filling pressure. - Left atrium: The atrium was severely dilated. - Right ventricle: Pacer wire or catheter noted in right ventricle. - Right atrium: Pacer wire or catheter noted in right atrium.  Cath  08/02/15 1st RPLB lesion, 50% stenosed. Dist RCA lesion, 30% stenosed. Ost 1st Mrg to 1st Mrg lesion, 100% stenosed. Ost LAD lesion, 40% stenosed. Mid LAD lesion, 40% stenosed. Post Atrio lesion, 90% stenosed. Post intervention, there is a 0% residual stenosis. The left ventricular systolic  function is normal. Mid RCA lesion, 20% stenosed.   Normal LV function without focal segmental wall motion abnormalities and ejection fraction of  55%.   Successful percutaneous cardiac intervention to the distal RCA treated with Angiosculpt scoring balloon, PTCA, and ultimate stenting with a 2.58 mm Xience Alpine DES stent postdilated to 2.51 mm with a 90% stenosis reduced to 0% and no change in the ostial PDA narrowing.  ASSESSMENT AND PLAN:  1.  Persistent atrial fibrillation/flutter: Currently on amiodarone and Eliquis.  CHA2DS2-VASc of 7.  Is status post ablation 08/14/2022.  She is unfortunately in atrial flutter today.  She feels quite poorly with fatigue, shortness of breath, palpitations.  She would prefer a rhythm control strategy.  She is already on amiodarone.  Repeat cardioversion would likely be unsuccessful long-term.  Suzzanne Brunkhorst plan for repeat ablation.  Risk, benefits, and alternatives to EP study and radiofrequency ablation for afib were also discussed in detail today. These risks include but are not limited to stroke, bleeding, vascular damage, tamponade, perforation, damage to the esophagus, lungs, and other structures, pulmonary vein stenosis, worsening renal function, and death. The patient understands these risk and wishes to proceed.  We Valery Amedee therefore proceed with catheter ablation at the next available time.  Carto, ICE, anesthesia are requested for the procedure.  Azim Gillingham also obtain CT PV protocol prior to the procedure to exclude LAA thrombus and further evaluate atrial anatomy.   2.  Sick sinus syndrome: Status post Medtronic dual-chamber pacemaker.  Device functioning appropriately.  No changes.  3.  Coronary artery disease: No current chest pain.  Continue with current management.  4.  Hypertension: Mildly elevated, usually well-controlled.  No changes.  5.  Second hypercoagulable state: Currently on Eliquis for atrial fibrillation  6.  High risk medication monitoring:  Currently on amiodarone.  Recent labs within normal limits.  Current medicines are reviewed at length with the patient today.   The patient does not have concerns regarding her medicines.  none  Labs/ tests ordered today include:  Orders Placed This Encounter  Procedures   EKG 12-Lead      Disposition:   FU 6 months  Signed, Vickki Igou Jorja Loa, MD  11/18/2022 11:57 AM     [facility] [address] [place] [address] [phone] (office) [phone] (fax)

## 2022-11-26 ENCOUNTER — Encounter: Payer: Self-pay | Admitting: Internal Medicine

## 2022-11-28 DIAGNOSIS — G4733 Obstructive sleep apnea (adult) (pediatric): Secondary | ICD-10-CM | POA: Diagnosis not present

## 2022-12-02 ENCOUNTER — Telehealth: Payer: Self-pay

## 2022-12-02 DIAGNOSIS — I4819 Other persistent atrial fibrillation: Secondary | ICD-10-CM

## 2022-12-02 NOTE — Telephone Encounter (Signed)
Pt's Ablation has been moved from 10/15 to 12/08/22...  Pt will go to [facility] in [place] on 6/13 for labs.  High Priority staff message has been sent to pre-cert/scheduling and Imaging Dept.

## 2022-12-03 ENCOUNTER — Other Ambulatory Visit: Payer: Self-pay

## 2022-12-03 DIAGNOSIS — I4819 Other persistent atrial fibrillation: Secondary | ICD-10-CM

## 2022-12-03 LAB — CBC
Hematocrit: 41 % (ref 34.0–46.6)
WBC: 7.8 10*3/uL (ref 3.4–10.8)

## 2022-12-04 ENCOUNTER — Ambulatory Visit (HOSPITAL_COMMUNITY)
Admission: RE | Admit: 2022-12-04 | Discharge: 2022-12-04 | Disposition: A | Discharge status: Home / Self Care | Payer: PPO | Source: Ambulatory Visit | Attending: Cardiology | Admitting: Cardiology

## 2022-12-04 DIAGNOSIS — I484 Atypical atrial flutter: Secondary | ICD-10-CM | POA: Insufficient documentation

## 2022-12-04 DIAGNOSIS — I4819 Other persistent atrial fibrillation: Secondary | ICD-10-CM | POA: Diagnosis not present

## 2022-12-04 LAB — CBC
Hemoglobin: 13.2 g/dL (ref 11.1–15.9)
MCH: 29.3 pg (ref 26.6–33.0)
MCHC: 32.2 g/dL (ref 31.5–35.7)
MCV: 91 fL (ref 79–97)
Platelets: 279 10*3/uL (ref 150–450)
RBC: 4.51 x10E6/uL (ref 3.77–5.28)
RDW: 15.2 % (ref 11.7–15.4)

## 2022-12-04 LAB — BASIC METABOLIC PANEL
BUN/Creatinine Ratio: 11 — ABNORMAL LOW (ref 12–28)
BUN: 16 mg/dL (ref 8–27)
CO2: 23 mmol/L (ref 20–29)
Calcium: 9.2 mg/dL (ref 8.7–10.3)
Chloride: 104 mmol/L (ref 96–106)
Creatinine, Ser: 1.44 mg/dL — ABNORMAL HIGH (ref 0.57–1.00)
Glucose: 99 mg/dL (ref 70–99)
Potassium: 3.8 mmol/L (ref 3.5–5.2)
Sodium: 142 mmol/L (ref 134–144)
eGFR: 37 mL/min/{1.73_m2} — ABNORMAL LOW (ref >59)

## 2022-12-04 MED ORDER — IOHEXOL 350 MG/ML SOLN
100.0000 mL | Freq: Once | INTRAVENOUS | Status: AC | PRN
  Administered 2022-12-04: 100 mL via INTRAVENOUS

## 2022-12-07 NOTE — Pre-Procedure Instructions (Signed)
Instructed patient on the following items: Arrival time 0730 Nothing to eat or drink after midnight No meds AM of procedure Responsible person to drive you home and stay with you for 24 hrs  Have you missed any doses of anti-coagulant Eliquis- takes twice a day, hasn't missed any doses.  Don't take dose in the morning.   

## 2022-12-08 ENCOUNTER — Ambulatory Visit (HOSPITAL_COMMUNITY)
Admission: RE | Admit: 2022-12-08 | Discharge: 2022-12-08 | Disposition: A | Discharge status: Home / Self Care | Payer: PPO | Attending: Cardiology | Admitting: Cardiology

## 2022-12-08 ENCOUNTER — Ambulatory Visit (HOSPITAL_COMMUNITY): Payer: PPO | Admitting: Anesthesiology

## 2022-12-08 ENCOUNTER — Encounter (HOSPITAL_COMMUNITY): Payer: Self-pay | Admitting: Cardiology

## 2022-12-08 ENCOUNTER — Encounter (HOSPITAL_COMMUNITY)
Admission: RE | Disposition: A | Discharge status: Home / Self Care | Payer: Self-pay | Source: Home / Self Care | Attending: Cardiology

## 2022-12-08 ENCOUNTER — Ambulatory Visit (HOSPITAL_BASED_OUTPATIENT_CLINIC_OR_DEPARTMENT_OTHER): Payer: PPO | Admitting: Anesthesiology

## 2022-12-08 ENCOUNTER — Ambulatory Visit (HOSPITAL_BASED_OUTPATIENT_CLINIC_OR_DEPARTMENT_OTHER): Payer: PPO

## 2022-12-08 DIAGNOSIS — Z79899 Other long term (current) drug therapy: Secondary | ICD-10-CM | POA: Insufficient documentation

## 2022-12-08 DIAGNOSIS — I252 Old myocardial infarction: Secondary | ICD-10-CM | POA: Diagnosis not present

## 2022-12-08 DIAGNOSIS — I130 Hypertensive heart and chronic kidney disease with heart failure and stage 1 through stage 4 chronic kidney disease, or unspecified chronic kidney disease: Secondary | ICD-10-CM | POA: Insufficient documentation

## 2022-12-08 DIAGNOSIS — I495 Sick sinus syndrome: Secondary | ICD-10-CM | POA: Insufficient documentation

## 2022-12-08 DIAGNOSIS — I10 Essential (primary) hypertension: Secondary | ICD-10-CM

## 2022-12-08 DIAGNOSIS — I4892 Unspecified atrial flutter: Secondary | ICD-10-CM | POA: Insufficient documentation

## 2022-12-08 DIAGNOSIS — N1832 Chronic kidney disease, stage 3b: Secondary | ICD-10-CM | POA: Diagnosis not present

## 2022-12-08 DIAGNOSIS — D6859 Other primary thrombophilia: Secondary | ICD-10-CM | POA: Insufficient documentation

## 2022-12-08 DIAGNOSIS — Z950 Presence of cardiac pacemaker: Secondary | ICD-10-CM | POA: Insufficient documentation

## 2022-12-08 DIAGNOSIS — E1122 Type 2 diabetes mellitus with diabetic chronic kidney disease: Secondary | ICD-10-CM | POA: Insufficient documentation

## 2022-12-08 DIAGNOSIS — I4891 Unspecified atrial fibrillation: Secondary | ICD-10-CM

## 2022-12-08 DIAGNOSIS — Z794 Long term (current) use of insulin: Secondary | ICD-10-CM | POA: Insufficient documentation

## 2022-12-08 DIAGNOSIS — I517 Cardiomegaly: Secondary | ICD-10-CM | POA: Diagnosis not present

## 2022-12-08 DIAGNOSIS — I509 Heart failure, unspecified: Secondary | ICD-10-CM | POA: Diagnosis not present

## 2022-12-08 DIAGNOSIS — E119 Type 2 diabetes mellitus without complications: Secondary | ICD-10-CM | POA: Diagnosis not present

## 2022-12-08 DIAGNOSIS — Z7901 Long term (current) use of anticoagulants: Secondary | ICD-10-CM | POA: Diagnosis not present

## 2022-12-08 DIAGNOSIS — I4819 Other persistent atrial fibrillation: Secondary | ICD-10-CM | POA: Insufficient documentation

## 2022-12-08 DIAGNOSIS — Z7985 Long-term (current) use of injectable non-insulin antidiabetic drugs: Secondary | ICD-10-CM | POA: Diagnosis not present

## 2022-12-08 DIAGNOSIS — Z955 Presence of coronary angioplasty implant and graft: Secondary | ICD-10-CM

## 2022-12-08 DIAGNOSIS — I2510 Atherosclerotic heart disease of native coronary artery without angina pectoris: Secondary | ICD-10-CM | POA: Diagnosis not present

## 2022-12-08 DIAGNOSIS — I5032 Chronic diastolic (congestive) heart failure: Secondary | ICD-10-CM | POA: Diagnosis not present

## 2022-12-08 DIAGNOSIS — I110 Hypertensive heart disease with heart failure: Secondary | ICD-10-CM | POA: Diagnosis not present

## 2022-12-08 HISTORY — PX: ATRIAL FIBRILLATION ABLATION: EP1191

## 2022-12-08 HISTORY — PX: TEE WITHOUT CARDIOVERSION: SHX5443

## 2022-12-08 HISTORY — PX: A-FLUTTER ABLATION: EP1230

## 2022-12-08 LAB — POCT ACTIVATED CLOTTING TIME
Activated Clotting Time: 348 seconds
Activated Clotting Time: 433 seconds

## 2022-12-08 LAB — GLUCOSE, CAPILLARY
Glucose-Capillary: 105 mg/dL — ABNORMAL HIGH (ref 70–99)
Glucose-Capillary: 150 mg/dL — ABNORMAL HIGH (ref 70–99)

## 2022-12-08 SURGERY — ATRIAL FIBRILLATION ABLATION
Anesthesia: General

## 2022-12-08 MED ORDER — HEPARIN SODIUM (PORCINE) 1000 UNIT/ML IJ SOLN
INTRAMUSCULAR | Status: DC | PRN
  Administered 2022-12-08: 14000 [IU] via INTRAVENOUS
  Administered 2022-12-08: 1000 [IU] via INTRAVENOUS

## 2022-12-08 MED ORDER — PHENYLEPHRINE HCL-NACL 20-0.9 MG/250ML-% IV SOLN
INTRAVENOUS | Status: DC | PRN
  Administered 2022-12-08: 20 ug/min via INTRAVENOUS

## 2022-12-08 MED ORDER — LACTATED RINGERS IV SOLN: INTRAVENOUS | Status: DC | PRN

## 2022-12-08 MED ORDER — ONDANSETRON HCL 4 MG/2ML IJ SOLN: 4.0000 mg | Freq: Four times a day (QID) | INTRAMUSCULAR | Status: DC | PRN

## 2022-12-08 MED ORDER — SUGAMMADEX SODIUM 200 MG/2ML IV SOLN
INTRAVENOUS | Status: DC | PRN
  Administered 2022-12-08: 200 mg via INTRAVENOUS

## 2022-12-08 MED ORDER — SODIUM CHLORIDE 0.9% FLUSH: 3.0000 mL | INTRAVENOUS | Status: DC | PRN

## 2022-12-08 MED ORDER — PROPOFOL 10 MG/ML IV BOLUS
INTRAVENOUS | Status: DC | PRN
  Administered 2022-12-08: 150 mg via INTRAVENOUS

## 2022-12-08 MED ORDER — VANCOMYCIN HCL IN DEXTROSE 1-5 GM/200ML-% IV SOLN
INTRAVENOUS | Status: AC
  Filled 2022-12-08: qty 200

## 2022-12-08 MED ORDER — HEPARIN SODIUM (PORCINE) 1000 UNIT/ML IJ SOLN
INTRAMUSCULAR | Status: DC | PRN
  Administered 2022-12-08: 1000 [IU] via INTRAVENOUS

## 2022-12-08 MED ORDER — PROTAMINE SULFATE 10 MG/ML IV SOLN
INTRAVENOUS | Status: DC | PRN
  Administered 2022-12-08: 40 mg via INTRAVENOUS

## 2022-12-08 MED ORDER — ROCURONIUM BROMIDE 10 MG/ML (PF) SYRINGE
PREFILLED_SYRINGE | INTRAVENOUS | Status: DC | PRN
  Administered 2022-12-08: 50 mg via INTRAVENOUS

## 2022-12-08 MED ORDER — HEPARIN (PORCINE) IN NACL 1000-0.9 UT/500ML-% IV SOLN
INTRAVENOUS | Status: DC | PRN
  Administered 2022-12-08 (×4): 500 mL

## 2022-12-08 MED ORDER — SODIUM CHLORIDE 0.9 % IV SOLN: INTRAVENOUS | Status: DC

## 2022-12-08 MED ORDER — LIDOCAINE 2% (20 MG/ML) 5 ML SYRINGE
INTRAMUSCULAR | Status: DC | PRN
  Administered 2022-12-08: 60 mg via INTRAVENOUS

## 2022-12-08 MED ORDER — ONDANSETRON HCL 4 MG/2ML IJ SOLN
INTRAMUSCULAR | Status: DC | PRN
  Administered 2022-12-08: 4 mg via INTRAVENOUS

## 2022-12-08 MED ORDER — VANCOMYCIN HCL 1000 MG IV SOLR
INTRAVENOUS | Status: DC | PRN
  Administered 2022-12-08: 1000 mg via INTRAVENOUS

## 2022-12-08 MED ORDER — DEXAMETHASONE SODIUM PHOSPHATE 10 MG/ML IJ SOLN
INTRAMUSCULAR | Status: DC | PRN
  Administered 2022-12-08: 5 mg via INTRAVENOUS

## 2022-12-08 MED ORDER — FENTANYL CITRATE (PF) 250 MCG/5ML IJ SOLN
INTRAMUSCULAR | Status: DC | PRN
  Administered 2022-12-08: 100 ug via INTRAVENOUS

## 2022-12-08 MED ORDER — HEPARIN SODIUM (PORCINE) 1000 UNIT/ML IJ SOLN
INTRAMUSCULAR | Status: AC
  Filled 2022-12-08: qty 10

## 2022-12-08 MED ORDER — SODIUM CHLORIDE 0.9 % IV SOLN: 250.0000 mL | INTRAVENOUS | Status: DC | PRN

## 2022-12-08 SURGICAL SUPPLY — 21 items
BAG SNAP BAND KOVER 36X36 (MISCELLANEOUS) IMPLANT
BLANKET WARM UNDERBOD FULL ACC (MISCELLANEOUS) ×2 IMPLANT
CATH ABLAT QDOT MICRO BI TC DF (CATHETERS) IMPLANT
CATH OCTARAY 2.0 F 3-3-3-3-3 (CATHETERS) IMPLANT
CATH PIGTAIL STEERABLE D1 8.7 (WIRE) IMPLANT
CATH S-M CIRCA TEMP PROBE (CATHETERS) IMPLANT
CATH SOUNDSTAR ECO 8FR (CATHETERS) IMPLANT
CATH WEB BI DIR CSDF CRV REPRO (CATHETERS) IMPLANT
CLOSURE MYNX CONTROL 6F/7F (Vascular Products) IMPLANT
CLOSURE PERCLOSE PROSTYLE (VASCULAR PRODUCTS) IMPLANT
COVER SWIFTLINK CONNECTOR (BAG) ×2 IMPLANT
PACK EP LATEX FREE (CUSTOM PROCEDURE TRAY) ×1
PACK EP LF (CUSTOM PROCEDURE TRAY) ×2 IMPLANT
PAD DEFIB RADIO PHYSIO CONN (PAD) ×2 IMPLANT
PATCH CARTO3 (PAD) IMPLANT
SHEATH CARTO VIZIGO SM CVD (SHEATH) IMPLANT
SHEATH PINNACLE 7F 10CM (SHEATH) IMPLANT
SHEATH PINNACLE 8F 10CM (SHEATH) IMPLANT
SHEATH PINNACLE 9F 10CM (SHEATH) IMPLANT
SHEATH PROBE COVER 6X72 (BAG) IMPLANT
TUBING SMART ABLATE COOLFLOW (TUBING) IMPLANT

## 2022-12-08 NOTE — Anesthesia Procedure Notes (Addendum)
Procedure Name: Intubation Date/Time: 12/08/2022 10:37 AM  Performed by: Quentin Ore, CRNAPre-anesthesia Checklist: Patient identified, Emergency Drugs available, Suction available and Patient being monitored Patient Re-evaluated:Patient Re-evaluated prior to induction Oxygen Delivery Method: Circle System Utilized Preoxygenation: Pre-oxygenation with 100% oxygen Induction Type: IV induction Ventilation: Mask ventilation without difficulty Laryngoscope Size: Mac and 3 Grade View: Grade I Tube type: Oral Tube size: 7.0 mm Number of attempts: 1 Airway Equipment and Method: Stylet and Oral airway Placement Confirmation: ETT inserted through vocal cords under direct vision, positive ETCO2 and breath sounds checked- equal and bilateral Secured at: 21 cm Tube secured with: Tape Dental Injury: Teeth and Oropharynx as per pre-operative assessment

## 2022-12-08 NOTE — Discharge Instructions (Signed)

## 2022-12-08 NOTE — Progress Notes (Signed)
Patient and husband was given discharge instructions. Both verbalized understanding. 

## 2022-12-08 NOTE — Interval H&P Note (Signed)
History and Physical Interval Note:  12/08/2022 7:50 AM  Virginia Huber  has presented today for surgery, with the diagnosis of afib, aflutter.  The various methods of treatment have been discussed with the patient and family. After consideration of risks, benefits and other options for treatment, the patient has consented to  Procedure(s): ATRIAL FIBRILLATION ABLATION (N/A) A-FLUTTER ABLATION (N/A) TRANSESOPHAGEAL ECHOCARDIOGRAM (N/A) as a surgical intervention.  The patient's history has been reviewed, patient examined, no change in status, stable for surgery.  I have reviewed the patient's chart and labs.  Questions were answered to the patient's satisfaction.     Mando Blatz [employer]

## 2022-12-08 NOTE — Anesthesia Preprocedure Evaluation (Signed)
Anesthesia Evaluation  Patient identified by MRN, date of birth, ID band Patient awake    Reviewed: Allergy & Precautions, H&P , NPO status , Patient's Chart, lab work & pertinent test results  Airway Mallampati: II   Neck ROM: full    Dental   Pulmonary sleep apnea    breath sounds clear to auscultation       Cardiovascular hypertension, + CAD, + Past MI, + Cardiac Stents and +CHF  + dysrhythmias Atrial Fibrillation + pacemaker  Rhythm:regular Rate:Normal     Neuro/Psych TIA   GI/Hepatic ,GERD  ,,  Endo/Other  diabetes, Type 2Hypothyroidism    Renal/GU Renal InsufficiencyRenal disease     Musculoskeletal  (+) Arthritis ,    Abdominal   Peds  Hematology   Anesthesia Other Findings   Reproductive/Obstetrics                             Anesthesia Physical Anesthesia Plan  ASA: 3  Anesthesia Plan: General   Post-op Pain Management:    Induction: Intravenous  PONV Risk Score and Plan: 3 and Ondansetron, Dexamethasone and Treatment may vary due to age or medical condition  Airway Management Planned: Oral ETT  Additional Equipment:   Intra-op Plan:   Post-operative Plan: Extubation in OR  Informed Consent: I have reviewed the patients History and Physical, chart, labs and discussed the procedure including the risks, benefits and alternatives for the proposed anesthesia with the patient or authorized representative who has indicated his/her understanding and acceptance.     Dental advisory given  Plan Discussed with: CRNA, Anesthesiologist and Surgeon  Anesthesia Plan Comments:        Anesthesia Quick Evaluation

## 2022-12-08 NOTE — Transfer of Care (Signed)
Immediate Anesthesia Transfer of Care Note  Patient: Virginia Huber  Procedure(s) Performed: ATRIAL FIBRILLATION ABLATION A-FLUTTER ABLATION TRANSESOPHAGEAL ECHOCARDIOGRAM  Patient Location: PACU  Anesthesia Type:General  Level of Consciousness: drowsy  Airway & Oxygen Therapy: Patient Spontanous Breathing and Patient connected to nasal cannula oxygen  Post-op Assessment: Report given to RN and Post -op Vital signs reviewed and stable  Post vital signs: Reviewed and stable  Last Vitals:  Vitals Value Taken Time  BP 126/57 12/08/22 1315  Temp 36.5 C 12/08/22 1316  Pulse 50 12/08/22 1318  Resp 20 12/08/22 1318  SpO2 92 % 12/08/22 1318  Vitals shown include unvalidated device data.  Last Pain:  Vitals:   12/08/22 1316  TempSrc: Temporal  PainSc: Asleep         Complications: There were no known notable events for this encounter.

## 2022-12-09 ENCOUNTER — Encounter (HOSPITAL_COMMUNITY): Payer: Self-pay | Admitting: Cardiology

## 2022-12-11 NOTE — Anesthesia Postprocedure Evaluation (Signed)
Anesthesia Post Note  Patient: Virginia Huber  Procedure(s) Performed: ATRIAL FIBRILLATION ABLATION A-FLUTTER ABLATION TRANSESOPHAGEAL ECHOCARDIOGRAM     Patient location during evaluation: Cath Lab Anesthesia Type: General Level of consciousness: awake and alert Pain management: pain level controlled Vital Signs Assessment: post-procedure vital signs reviewed and stable Respiratory status: spontaneous breathing, nonlabored ventilation, respiratory function stable and patient connected to nasal cannula oxygen Cardiovascular status: blood pressure returned to baseline and stable Postop Assessment: no apparent nausea or vomiting Anesthetic complications: no   There were no known notable events for this encounter.  Last Vitals:  Vitals:   12/08/22 1600 12/08/22 1628  BP: (!) 171/70   Pulse: (!) 55 (!) 55  Resp: (!) 23 19  Temp:    SpO2: 91% 97%    Last Pain:  Vitals:   12/08/22 1348  TempSrc: Temporal  PainSc:                  Hermione Havlicek S

## 2022-12-15 DIAGNOSIS — E113411 Type 2 diabetes mellitus with severe nonproliferative diabetic retinopathy with macular edema, right eye: Secondary | ICD-10-CM | POA: Diagnosis not present

## 2022-12-15 DIAGNOSIS — H34831 Tributary (branch) retinal vein occlusion, right eye, with macular edema: Secondary | ICD-10-CM | POA: Diagnosis not present

## 2022-12-15 DIAGNOSIS — H353124 Nonexudative age-related macular degeneration, left eye, advanced atrophic with subfoveal involvement: Secondary | ICD-10-CM | POA: Diagnosis not present

## 2022-12-15 DIAGNOSIS — H353111 Nonexudative age-related macular degeneration, right eye, early dry stage: Secondary | ICD-10-CM | POA: Diagnosis not present

## 2022-12-15 LAB — HM DIABETES EYE EXAM

## 2022-12-16 ENCOUNTER — Encounter: Payer: Self-pay | Admitting: Internal Medicine

## 2022-12-22 DIAGNOSIS — L928 Other granulomatous disorders of the skin and subcutaneous tissue: Secondary | ICD-10-CM | POA: Diagnosis not present

## 2022-12-26 ENCOUNTER — Other Ambulatory Visit: Payer: Self-pay | Admitting: Cardiology

## 2022-12-28 ENCOUNTER — Other Ambulatory Visit: Payer: Self-pay | Admitting: Cardiology

## 2022-12-28 DIAGNOSIS — G4733 Obstructive sleep apnea (adult) (pediatric): Secondary | ICD-10-CM | POA: Diagnosis not present

## 2022-12-28 MED ORDER — CARVEDILOL 25 MG PO TABS: 25.0000 mg | ORAL_TABLET | Freq: Two times a day (BID) | ORAL | 3 refills | Status: DC

## 2022-12-28 NOTE — Telephone Encounter (Signed)
This is a A-Fib clinic pt 

## 2022-12-30 LAB — ECHO TEE

## 2023-01-05 ENCOUNTER — Ambulatory Visit (HOSPITAL_COMMUNITY)
Admission: RE | Admit: 2023-01-05 | Discharge: 2023-01-05 | Disposition: A | Discharge status: Home / Self Care | Payer: PPO | Source: Ambulatory Visit | Attending: Internal Medicine | Admitting: Internal Medicine

## 2023-01-05 ENCOUNTER — Encounter (HOSPITAL_COMMUNITY): Payer: Self-pay | Admitting: Internal Medicine

## 2023-01-05 VITALS — BP 112/64 | HR 71 | Ht 63.0 in | Wt 121.0 lb

## 2023-01-05 DIAGNOSIS — Z950 Presence of cardiac pacemaker: Secondary | ICD-10-CM | POA: Insufficient documentation

## 2023-01-05 DIAGNOSIS — I4819 Other persistent atrial fibrillation: Secondary | ICD-10-CM

## 2023-01-05 DIAGNOSIS — G4733 Obstructive sleep apnea (adult) (pediatric): Secondary | ICD-10-CM | POA: Insufficient documentation

## 2023-01-05 DIAGNOSIS — I110 Hypertensive heart disease with heart failure: Secondary | ICD-10-CM | POA: Diagnosis not present

## 2023-01-05 DIAGNOSIS — Z7901 Long term (current) use of anticoagulants: Secondary | ICD-10-CM | POA: Diagnosis not present

## 2023-01-05 DIAGNOSIS — I495 Sick sinus syndrome: Secondary | ICD-10-CM | POA: Diagnosis not present

## 2023-01-05 DIAGNOSIS — I5022 Chronic systolic (congestive) heart failure: Secondary | ICD-10-CM | POA: Diagnosis not present

## 2023-01-05 DIAGNOSIS — D6869 Other thrombophilia: Secondary | ICD-10-CM | POA: Diagnosis not present

## 2023-01-05 DIAGNOSIS — I2510 Atherosclerotic heart disease of native coronary artery without angina pectoris: Secondary | ICD-10-CM | POA: Insufficient documentation

## 2023-01-05 NOTE — Patient Instructions (Signed)
Increase water intake Can try decreasing coreg to once a day temporarily if dizziness does not improve with water intake

## 2023-01-05 NOTE — Progress Notes (Signed)
Primary Care Physician: Assunta Found, MD Primary Cardiologist: Dr Diona Browner  Primary Electrophysiologist: Dr Elberta Fortis  Referring Physician: Dr Alinda Dooms is a 77 y.o. female with a history of CAD, PAD, HFrEF, HTN, SSS s/p PPM, OSA, atrial flutter, atrial fibrillation who presents for follow up in the [facility]. She was hospitalized December 2017 with atrial fibrillation rapid rates. She was cardioverted to junctional rhythm. She is now status post Medtronic dual-chamber pacemaker. She has been maintained on amiodarone but has continued to have episodes of rapid afib requiring hospitalization. She is now s/p afib and flutter ablation with Dr Elberta Fortis. Patient is on Eliquis for a CHADS2VASC score of 6. She did have some oozing from her groin sites and was kept two additional nights post ablation.   On follow up today, patient is s/p DCCV on 09/30/22. Unfortunately, her PPM alerted that she was back out of rhythm 3 days later. She is fatigued, especially with exertion. No bleeding issues on anticoagulation. Of note, her daughter was just diagnosed with lung cancer.   On follow up 01/05/23, she is currently in NSR. Patient is s/p Afib ablation on 12/08/22 by Dr. Elberta Fortis. No episodes of Afib since ablation. Intermittent dizziness since last week; no syncope. Admits to maybe not being consistent with hydration. No chest pain, SOB, or trouble swallowing. Leg sites healed without issue. No missed doses of anticoagulant.  Today, she denies symptoms of orthopnea, PND, lower extremity edema, dizziness, presyncope, syncope, snoring, daytime somnolence, bleeding, or neurologic sequela. The patient is tolerating medications without difficulties and is otherwise without complaint today.   Atrial Fibrillation Risk Factors:  she does have symptoms or diagnosis of sleep apnea. she is compliant with CPAP therapy. she does not have a history of rheumatic fever.   she  has a BMI of Body mass index is 21.43 kg/m.[name] Filed Weights   01/05/23 1012  Weight: 54.9 kg     Family History  Problem Relation Age of Onset   Heart disease Mother        deceased   Heart disease Father        deceased, heart disease   Diabetes Brother    Heart disease Brother    Thyroid disease Brother    Heart disease Sister    Heart disease Brother    Thyroid disease Brother    Lupus Daughter    Colon cancer Neg Hx     Atrial Fibrillation Management history:  Previous antiarrhythmic drugs: amiodarone  Previous cardioversions: 2019, 05/12/22, 09/30/22 Previous ablations: 08/14/22, 12/08/22 CHADS2VASC score: 6 Anticoagulation history: Eliquis   Past Medical History:  Diagnosis Date   (HFpEF) heart failure with preserved ejection fraction (HCC)    Advanced nonexudative age-related macular degeneration of left eye with subfoveal involvement 06/26/2020   Ongoing, accounts for acuity   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 h/o PAF/flutter during admission in 2013 for PNA. b. PAF during adm for NSTEMI 07/2015, subsequent paroxysms since then.   B12 deficiency anemia    Branch retinal vein occlusion with macular edema of right eye 10/11/2019   Chronic renal failure, stage 3b (HCC) 09/23/2017   Coronary artery disease 11/30/2014   a. remote MI. b. h/o PTCA with scoring balloon to OM1 11/2014. c. NSTEMI 03/2015 s/p DES to prox-mid Cx. d. NSTEMI 07/2015 s/p scoring balloon/PTCA/DES to dRCA with PAF during that admission  Cutaneous lupus erythematosus    Early stage nonexudative age-related macular degeneration of right eye 02/27/2020   Essential hypertension    GERD (gastroesophageal reflux disease)    History of blood transfusion 1980's   2nd surgical procedures   Hypercholesteremia    Hypothyroidism    Myocardial infarction (HCC) 02/2012   NSTEMI (non-ST elevated myocardial infarction) (HCC) 04/02/2015    OSA (obstructive sleep apnea) 05/13/2016   Ovarian tumor    PAD (peripheral artery disease) (HCC)    a. s/p LE angio 2015; followed by Dr. Kirke Corin - managed medically.   Pericardial effusion    a. 06/2016 after ppm - s/p pericardiocentesis.   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 s/p Medtronic PPM 06/2016, c/b lead perf/pericardial effusion.   TIA (transient ischemic attack)    Type 2 diabetes with nephropathy (HCC) 02/29/2012   Past Surgical History:  Procedure Laterality Date   A-FLUTTER ABLATION N/A 12/08/2022   Procedure: A-FLUTTER ABLATION;  Surgeon: Regan Lemming, MD;  Location: [facility];  Service: Cardiovascular;  Laterality: N/A;   ABDOMINAL AORTAGRAM N/A 01/03/2014   Procedure: ABDOMINAL Ronny Flurry;  Surgeon: Iran Ouch, MD;  Location: [facility] CATH LAB;  Service: Cardiovascular;  Laterality: N/A;   ABDOMINAL HYSTERECTOMY  06/22/1970   "partial"   APPENDECTOMY  02/20/1969   ATRIAL FIBRILLATION ABLATION N/A 08/14/2022   Procedure: ATRIAL FIBRILLATION ABLATION;  Surgeon: Regan Lemming, MD;  Location: [facility];  Service: Cardiovascular;  Laterality: N/A;   ATRIAL FIBRILLATION ABLATION N/A 12/08/2022   Procedure: ATRIAL FIBRILLATION ABLATION;  Surgeon: Regan Lemming, MD;  Location: [facility];  Service: Cardiovascular;  Laterality: N/A;   CARDIAC CATHETERIZATION  06/22/2006   Tiny OM-2 with 90% narrowing. Med tx.   CARDIAC CATHETERIZATION N/A 11/30/2014   Procedure: Left Heart Cath and Coronary Angiography;  Surgeon: Lennette Bihari, MD; LAD 20%, CFX 50%, OM1 95%, right PLB  30%, LV normal    CARDIAC CATHETERIZATION N/A 11/30/2014   Procedure: Coronary Balloon Angioplasty;  Surgeon: Lennette Bihari, MD;  Angiosculpt scoring balloon and PTCA to the OM1 reducing stenosis from 95% to less than 10%   CARDIAC CATHETERIZATION N/A 04/03/2015   Procedure: Left Heart Cath and Coronary Angiography;  Surgeon: Dolores Patty, MD; dLAD 50%, CFX 90%, OM1 100%, PLA 15%, LVEDP 13     CARDIAC CATHETERIZATION N/A 04/03/2015   Procedure: Coronary Stent Intervention;  Surgeon: Tonny Bollman, MD; 3.0x18 mm Xience DES to the CFX     CARDIAC CATHETERIZATION N/A 08/02/2015   Procedure: Left Heart Cath and Coronary Angiography;  Surgeon: Lennette Bihari, MD;  Location: [facility] INVASIVE CV LAB;  Service: Cardiovascular;  Laterality: N/A;   CARDIAC CATHETERIZATION N/A 08/02/2015   Procedure: Coronary Stent Intervention;  Surgeon: Lennette Bihari, MD;  [facility]: [facility];  Service: Cardiovascular;  Laterality: N/A;   CARDIAC CATHETERIZATION N/A 06/25/2016   Procedure: Pericardiocentesis;  Surgeon: Will Jorja Loa, MD;  Location: [facility];  Service: Cardiovascular;  Laterality: N/A;   cardiac stents     CARDIOVERSION N/A 12/15/2017   Procedure: CARDIOVERSION;  Surgeon: Elease Hashimoto,  Deloris Ping, MD;  Location: [facility] ENDOSCOPY;  Service: Cardiovascular;  Laterality: N/A;   CARDIOVERSION N/A 05/12/2022   Procedure: CARDIOVERSION;  Surgeon: Quintella Reichert, MD;  Location: [facility] ENDOSCOPY;  Service: Cardiovascular;  Laterality: N/A;   CARDIOVERSION N/A 09/30/2022   Procedure: CARDIOVERSION;  Surgeon: Thurmon Fair, MD;  [facility]: [facility];  Service: Cardiovascular;  Laterality: N/A;   CARDIOVERSION N/A 10/29/2022   Procedure: CARDIOVERSION;  Surgeon: Quintella Reichert, MD;  [facility]: [facility];  Service: Cardiovascular;  Laterality: N/A;   CHOLECYSTECTOMY OPEN  02/20/1989   COLONOSCOPY  06/23/2003   Dr. Karilyn Cota: pancolonic divericula, polyp, path unknown currently    COLONOSCOPY  06/22/2010   Dr. Darrick Penna: Normal TI, scattered diverticula in entire colon, small internal hemorrhoids, normal colon biopsies. Colonoscopy in 5-10 years.    COLONOSCOPY WITH PROPOFOL N/A 06/02/2021   pancolonic diverticulosis. Two 4-6 mm polyps in transverse colon. Sessile serrated and hyperplastic. 5 year surveillance if benefits outweigh the risks.   COLOSTOMY  05/23/1979   COLOSTOMY REVERSAL  11/21/1979   EP IMPLANTABLE DEVICE N/A 06/25/2016   Procedure: Lead Revision/Repair;  Surgeon: Will Jorja Loa, MD;  [facility]: [facility];  Service: Cardiovascular;  Laterality: N/A;   EP IMPLANTABLE DEVICE N/A 06/25/2016   Procedure: Pacemaker Implant;  Surgeon: Will Jorja Loa, MD;  [facility]: [facility];  Service: Cardiovascular;  Laterality: N/A;   EXCISIONAL HEMORRHOIDECTOMY  02/20/1969   EYE SURGERY Left 06/22/1998   "branch vein occlusion"   EYE SURGERY Left ~ 2001   "smoothed out wrinkle"   LEFT HEART CATH AND CORONARY ANGIOGRAPHY N/A 05/11/2022   Procedure: LEFT HEART CATH AND CORONARY ANGIOGRAPHY;  Surgeon: Kathleene Hazel, MD;  Location: [facility];  Service: Cardiovascular;  Laterality: N/A;   LEFT OOPHORECTOMY  05/23/1979   nicked bowel, peritonitis, colostomy; colostomy reversed 1981    LOWER EXTREMITY ANGIOGRAM N/A 01/03/2014   Procedure: LOWER EXTREMITY ANGIOGRAM;  Surgeon: Iran Ouch, MD;  Location: [facility] CATH LAB;  Service: Cardiovascular;  Laterality: N/A;   Nuclear med stress test  10/21/2011   Small area of mild ischemia inferoapically.   PARTIAL HYSTERECTOMY  02/20/1969   left ovaries, then ovaries removed later due tumors    POLYPECTOMY  06/02/2021   Procedure: POLYPECTOMY;  Surgeon: Lanelle Bal, DO;  Location: [facility];  Service: Endoscopy;;   right eye surgery  03/2021   RIGHT OOPHORECTOMY  02/20/1969   TEE WITHOUT CARDIOVERSION N/A 05/12/2022   Procedure: TRANSESOPHAGEAL ECHOCARDIOGRAM (TEE);  Surgeon:  Quintella Reichert, MD;  Location: [facility] ENDOSCOPY;  Service: Cardiovascular;  Laterality: N/A;   TEE WITHOUT CARDIOVERSION N/A 08/14/2022   Procedure: TRANSESOPHAGEAL ECHOCARDIOGRAM;  Surgeon: Regan Lemming, MD;  Location: [facility] INVASIVE CV LAB;  Service: Cardiovascular;  Laterality: N/A;   TEE WITHOUT CARDIOVERSION N/A 12/08/2022   Procedure: TRANSESOPHAGEAL ECHOCARDIOGRAM;  Surgeon: Regan Lemming, MD;  Location: [facility] INVASIVE CV LAB;  Service: Cardiovascular;  Laterality: N/A;    Current Outpatient Medications  Medication Sig Dispense Refill   acetaminophen (TYLENOL) 500 MG tablet Take 1,000 mg by mouth every 8 (eight) hours as needed for headache, moderate pain or mild pain.     ALPRAZolam (XANAX) 0.25 MG tablet Take 1 tablet (0.25 mg total) by mouth at bedtime as needed for anxiety. 30 tablet 1   amiodarone (PACERONE) 200 MG tablet TAKE ONE TABLET BY MOUTH ONCE DAILY 30 tablet 6   atorvastatin (LIPITOR) 80 MG tablet TAKE ONE  TABLET BY MOUTH EVERY DAY AT 6:00PM 90 tablet 3   calcitRIOL (ROCALTROL) 0.25 MCG capsule Take 0.25 mcg by mouth every Monday, Wednesday, and Friday.     carvedilol (COREG) 25 MG tablet Take 1 tablet (25 mg total) by mouth 2 (two) times daily. 60 tablet 3   Cholecalciferol 50 MCG (2000 UT) TABS Take 4,000 Units by mouth at bedtime.     clopidogrel (PLAVIX) 75 MG tablet TAKE ONE TABLET (75MG  TOTAL) BY MOUTH DAILY 90 tablet 3   Continuous Blood Gluc Receiver (FREESTYLE LIBRE 2 READER) DEVI 1 each by Does not apply route daily. 1 each 0   Continuous Blood Gluc Sensor (FREESTYLE LIBRE 2 SENSOR) MISC USE ONE FOR FOURTEEN DAYS 6 each 3   ELIQUIS 5 MG TABS tablet TAKE ONE TABLET BY MOUTH TWICE DAILY 60 tablet 5   furosemide (LASIX) 40 MG tablet Take 40 mg by mouth daily as needed for fluid or edema.     glucose blood (FREESTYLE PRECISION NEO TEST) test strip Use as instructed 100 each 3   Insulin NPH, Human,, Isophane, (NOVOLIN N FLEXPEN) 100 UNIT/ML Kiwkpen Inject 10-15  Units into the skin as directed. Inject 15 units subcutaneously in the morning & inject 10 units subcutaneously at night.     levothyroxine (SYNTHROID) 112 MCG tablet TAKE ONE TABLET BY MOUTH ONCE DAILY 90 tablet 2   nitroGLYCERIN (NITROSTAT) 0.4 MG SL tablet Place 1 tablet (0.4 mg total) under the tongue every 5 (five) minutes x 3 doses as needed for chest pain (If not relief after 3rd dose, call 911 or go to ED). 25 tablet 0   rOPINIRole (REQUIP) 0.5 MG tablet Take 1 tablet (0.5 mg total) by mouth at bedtime. 30 tablet 5   Semaglutide, 2 MG/DOSE, 8 MG/3ML SOPN Inject 2 mg as directed once a week. (Patient not taking: Reported on 01/05/2023) 9 mL 3   No current facility-administered medications for this encounter.    Allergies  Allergen Reactions   Penicillins Hives    Has patient had a PCN reaction causing immediate rash, facial/tongue/throat swelling, SOB or lightheadedness with hypotension: Yes Has patient had a PCN reaction causing severe rash involving mucus membranes or skin necrosis: No Has patient had a PCN reaction that required hospitalization No Has patient had a PCN reaction occurring within the last 10 years: No If all of the above answers are "NO", then may proceed with Cephalosporin use.   Percocet [Oxycodone-Acetaminophen] Nausea And Vomiting    ROS- All systems are reviewed and negative except as per the HPI above.  Physical Exam: Vitals:   01/05/23 1012  BP: 112/64  Pulse: 71  Weight: 54.9 kg  Height: 5\' 3"  (1.6 m)    GEN- The patient is well appearing, alert and oriented x 3 today.   Neck - no JVD or carotid bruit noted Lungs- Clear to ausculation bilaterally, normal work of breathing Heart- Regular rate and rhythm, no murmurs, rubs or gallops, PMI not laterally displaced Extremities- no clubbing, cyanosis, or edema Skin - no rash or ecchymosis noted   Wt Readings from Last 3 Encounters:  01/05/23 54.9 kg  12/08/22 58.5 kg  11/18/22 59 kg    EKG today  demonstrates  Vent. rate 71 BPM PR interval 344 ms QRS duration 142 ms QT/QTcB 460/499 ms P-R-T axes 91 -57 185 Atrial-paced rhythm with prolonged AV conduction Left axis deviation Left ventricular hypertrophy with QRS widening and repolarization abnormality ( Cornell product ) Lateral infarct , age undetermined  Abnormal ECG When compared with ECG of 08-Dec-2022 13:26, PREVIOUS ECG IS PRESENT  TEE 08/14/22 demonstrated   1. Left ventricular ejection fraction, by estimation, is 30 to 35%. The  left ventricle has moderately decreased function. The left ventricle  demonstrates global hypokinesis.   2. Right ventricular systolic function is mildly reduced. The right  ventricular size is normal.   3. Left atrial size was mildly dilated. No left atrial/left atrial  appendage thrombus was detected.   4. The mitral valve is normal in structure. Mild mitral valve  regurgitation. No evidence of mitral stenosis.   5. The aortic valve is tricuspid. Aortic valve regurgitation is not  visualized. No aortic stenosis is present.   6. The inferior vena cava is normal in size with greater than 50%  respiratory variability, suggesting right atrial pressure of 3 mmHg.   Epic records are reviewed at length today  CHA2DS2-VASc Score = 6  The patient's score is based upon: CHF History: 1 HTN History: 1 Diabetes History: 0 Stroke History: 0 Vascular Disease History: 1 Age Score: 2 Gender Score: 1      ASSESSMENT AND PLAN: 1. Persistent Atrial Fibrillation/atrial flutter The patient's CHA2DS2-VASc score is 6, indicating a 9.7% annual risk of stroke.   S/p afib and flutter ablation 08/14/22 S/p DCCV 09/30/22 with quick return of atrial flutter. S/p Afib ablation on 12/08/22.  She is in NSR.  Continue amiodarone 200 mg daily.  Continue Eliquis 5 mg BID with no missed doses for 3 months post ablation.  Continue carvedilol 25 mg BID. Drink fluids consistently. If she remains intermittently dizzy  despite consistent adequate hydration, can consider as a temporary trial to take coreg 25 mg once daily. Suspect if she increases fluid intake this will resolve.   2. Secondary Hypercoagulable State (ICD10:  D68.69) The patient is at significant risk for stroke/thromboembolism based upon her CHA2DS2-VASc Score of 6.  Continue Apixaban (Eliquis).  No missed doses.   3. Chronic systolic CHF EF improved to 40-45% Fluid status appears stable today.  4. Obstructive sleep apnea Encouraged compliance with CPAP therapy.  5. SSS S/p PPM, followed by Dr Elberta Fortis and the device clinic.  6. CAD No anginal symptoms.   Follow up as scheduled with Dr. Elberta Fortis.   Lake Bells, PA-C [facility] [address] [place], [place] [address] [phone] 01/05/2023 10:39 AM

## 2023-01-06 ENCOUNTER — Ambulatory Visit (INDEPENDENT_AMBULATORY_CARE_PROVIDER_SITE_OTHER): Payer: PPO

## 2023-01-06 ENCOUNTER — Other Ambulatory Visit: Payer: Self-pay | Admitting: Cardiology

## 2023-01-06 DIAGNOSIS — I495 Sick sinus syndrome: Secondary | ICD-10-CM

## 2023-01-07 DIAGNOSIS — H8110 Benign paroxysmal vertigo, unspecified ear: Secondary | ICD-10-CM | POA: Diagnosis not present

## 2023-01-07 DIAGNOSIS — Z6822 Body mass index (BMI) 22.0-22.9, adult: Secondary | ICD-10-CM | POA: Diagnosis not present

## 2023-01-07 LAB — CUP PACEART REMOTE DEVICE CHECK
Battery Remaining Longevity: 44 mo
Battery Voltage: 2.97 V
Brady Statistic AP VP Percent: 0.29 %
Brady Statistic AP VS Percent: 61.73 %
Brady Statistic AS VP Percent: 0.49 %
Brady Statistic AS VS Percent: 37.49 %
Brady Statistic RA Percent Paced: 61.25 %
Brady Statistic RV Percent Paced: 0.77 %
Date Time Interrogation Session: 20240717074402
Implantable Lead Connection Status: 753985
Implantable Lead Connection Status: 753985
Implantable Lead Implant Date: 20180104
Implantable Lead Implant Date: 20180104
Implantable Lead Location: 753859
Implantable Lead Location: 753860
Implantable Lead Model: 5076
Implantable Lead Model: 5076
Implantable Pulse Generator Implant Date: 20180104
Lead Channel Impedance Value: 323 Ohm
Lead Channel Impedance Value: 361 Ohm
Lead Channel Impedance Value: 380 Ohm
Lead Channel Impedance Value: 437 Ohm
Lead Channel Pacing Threshold Amplitude: 0.625 V
Lead Channel Pacing Threshold Amplitude: 1.375 V
Lead Channel Pacing Threshold Pulse Width: 0.4 ms
Lead Channel Pacing Threshold Pulse Width: 0.4 ms
Lead Channel Sensing Intrinsic Amplitude: 1.875 mV
Lead Channel Sensing Intrinsic Amplitude: 1.875 mV
Lead Channel Sensing Intrinsic Amplitude: 16.125 mV
Lead Channel Sensing Intrinsic Amplitude: 16.125 mV
Lead Channel Setting Pacing Amplitude: 2 V
Lead Channel Setting Pacing Amplitude: 2.75 V
Lead Channel Setting Pacing Pulse Width: 0.4 ms
Lead Channel Setting Sensing Sensitivity: 2.8 mV
Zone Setting Status: 755011

## 2023-01-13 ENCOUNTER — Ambulatory Visit: Payer: PPO | Admitting: Endocrinology

## 2023-01-14 ENCOUNTER — Ambulatory Visit (INDEPENDENT_AMBULATORY_CARE_PROVIDER_SITE_OTHER): Payer: PPO | Admitting: Podiatry

## 2023-01-14 DIAGNOSIS — M79675 Pain in left toe(s): Secondary | ICD-10-CM

## 2023-01-14 DIAGNOSIS — B351 Tinea unguium: Secondary | ICD-10-CM

## 2023-01-14 DIAGNOSIS — M79674 Pain in right toe(s): Secondary | ICD-10-CM

## 2023-01-14 DIAGNOSIS — E119 Type 2 diabetes mellitus without complications: Secondary | ICD-10-CM

## 2023-01-14 DIAGNOSIS — Z7901 Long term (current) use of anticoagulants: Secondary | ICD-10-CM

## 2023-01-14 NOTE — Progress Notes (Signed)
  Subjective:  Patient ID: Virginia Huber, female    DOB: 02/21/1946,  MRN: [id number]  Chief Complaint  Patient presents with   Nail Problem    Diabetic Foot Care- nail trim     77 y.o. female presents with the above complaint. History confirmed with patient. Patient presenting with pain related to dystrophic thickened elongated nails. Patient is unable to trim own nails related to nail dystrophy and/or mobility issues. Patient does have a history of T2DM.   Objective:  Physical Exam: warm, good capillary refill nail exam onychomycosis of the toenails, onycholysis, and dystrophic nails DP pulses palpable, PT pulses palpable, and protective sensation intact Left Foot:  Pain with palpation of nails due to elongation and dystrophic growth.  Right Foot: Pain with palpation of nails due to elongation and dystrophic growth.   Assessment:   1. Pain due to onychomycosis of toenails of both feet   2. Diabetes mellitus without complication (HCC)   3. Anticoagulated        Plan:  Patient was evaluated and treated and all questions answered.  #Onychomycosis with pain  -Nails palliatively debrided as below. -Educated on self-care  Procedure: Nail Debridement Rationale: Pain Type of Debridement: manual, sharp debridement. Instrumentation: Nail nipper, rotary burr. Number of Nails: 10  Return in about 3 months (around 04/16/2023) for [facility].         Corinna Gab, DPM [facility] / [facility]

## 2023-01-21 DIAGNOSIS — D631 Anemia in chronic kidney disease: Secondary | ICD-10-CM | POA: Diagnosis not present

## 2023-01-21 DIAGNOSIS — R809 Proteinuria, unspecified: Secondary | ICD-10-CM | POA: Diagnosis not present

## 2023-01-21 DIAGNOSIS — N189 Chronic kidney disease, unspecified: Secondary | ICD-10-CM | POA: Diagnosis not present

## 2023-01-21 NOTE — Progress Notes (Signed)
Remote pacemaker transmission.   

## 2023-01-24 ENCOUNTER — Encounter: Payer: Self-pay | Admitting: Cardiology

## 2023-01-25 NOTE — Telephone Encounter (Signed)
Left message to call back  

## 2023-01-26 NOTE — Telephone Encounter (Signed)
Pt returning call

## 2023-01-28 DIAGNOSIS — E1122 Type 2 diabetes mellitus with diabetic chronic kidney disease: Secondary | ICD-10-CM | POA: Diagnosis not present

## 2023-01-28 DIAGNOSIS — E1129 Type 2 diabetes mellitus with other diabetic kidney complication: Secondary | ICD-10-CM | POA: Diagnosis not present

## 2023-01-28 DIAGNOSIS — N2581 Secondary hyperparathyroidism of renal origin: Secondary | ICD-10-CM | POA: Diagnosis not present

## 2023-01-28 DIAGNOSIS — G4733 Obstructive sleep apnea (adult) (pediatric): Secondary | ICD-10-CM | POA: Diagnosis not present

## 2023-01-28 DIAGNOSIS — N1832 Chronic kidney disease, stage 3b: Secondary | ICD-10-CM | POA: Diagnosis not present

## 2023-01-28 NOTE — Telephone Encounter (Signed)
Really having a time with my dizziness & shakiness since ablation. Shakiness has been going on a long time but getting worse. BPs WNL, occasionally elevated. HRs avb 60-70s. Will forward to MD for advisement.  (Dr. Elberta Fortis -- Amiodarone was decreased in the past secondary to tremors.  I think it was restarted temporarily as bridge to recent repeat ablation 12/08/22)

## 2023-01-31 ENCOUNTER — Other Ambulatory Visit: Payer: Self-pay | Admitting: Neurology

## 2023-02-01 ENCOUNTER — Encounter: Payer: Self-pay | Admitting: Internal Medicine

## 2023-02-01 ENCOUNTER — Ambulatory Visit (INDEPENDENT_AMBULATORY_CARE_PROVIDER_SITE_OTHER): Payer: PPO | Admitting: Internal Medicine

## 2023-02-01 VITALS — BP 146/80 | HR 78 | Ht 63.0 in | Wt 120.8 lb

## 2023-02-01 DIAGNOSIS — E039 Hypothyroidism, unspecified: Secondary | ICD-10-CM | POA: Diagnosis not present

## 2023-02-01 DIAGNOSIS — Z7985 Long-term (current) use of injectable non-insulin antidiabetic drugs: Secondary | ICD-10-CM

## 2023-02-01 DIAGNOSIS — E785 Hyperlipidemia, unspecified: Secondary | ICD-10-CM | POA: Diagnosis not present

## 2023-02-01 DIAGNOSIS — E1165 Type 2 diabetes mellitus with hyperglycemia: Secondary | ICD-10-CM

## 2023-02-01 DIAGNOSIS — Z794 Long term (current) use of insulin: Secondary | ICD-10-CM | POA: Diagnosis not present

## 2023-02-01 DIAGNOSIS — E1159 Type 2 diabetes mellitus with other circulatory complications: Secondary | ICD-10-CM | POA: Diagnosis not present

## 2023-02-01 LAB — POCT GLYCOSYLATED HEMOGLOBIN (HGB A1C): Hemoglobin A1C: 6.4 % — AB (ref 4.0–5.6)

## 2023-02-01 MED ORDER — NOVOLIN N FLEXPEN 100 UNIT/ML ~~LOC~~ SUPN
PEN_INJECTOR | SUBCUTANEOUS | Status: DC
Start: 2023-02-01 — End: 2023-08-04

## 2023-02-01 MED ORDER — OZEMPIC (1 MG/DOSE) 4 MG/3ML ~~LOC~~ SOPN: 1.0000 mg | PEN_INJECTOR | SUBCUTANEOUS | Status: DC

## 2023-02-01 NOTE — Addendum Note (Signed)
Addended by: Pollie Meyer on: 02/01/2023 10:57 AM   Modules accepted: Orders

## 2023-02-01 NOTE — Patient Instructions (Addendum)
Please use the following regimen: - NPH/R 70/30 15 units in am and 5 units at bedtime  Please decrease: - Ozempic 1 mg weekly (36 clicks)  Continue Levothyroxine 112 mcg daily.  Take the thyroid hormone every day, with water, at least 30 minutes before breakfast, separated by at least 4 hours from: - acid reflux medications - calcium - iron - multivitamins  Please return in 4-6 months.

## 2023-02-01 NOTE — Progress Notes (Signed)
Patient ID: KAYELENE ELIE, female   DOB: [birth year], 77 y.o.   MRN: [id number]   HPI: KALLEIGH BARBARITO is a 77 y.o.-year-old female, initially referred by her PCP, Dr.Golding, returning for follow-up for DM2, dx in ~2005, insulin-dependent since 2006, uncontrolled, with complications (CAD, s/p NSTEMI, s/p PTCA and DES; CHF; PAF/flutter; PAD; CKD; cerebrovascular disease - s/p TIA; PN).  She previously saw my colleague, Dr. Everardo All.  Our last visit was 5 months ago.  Interim history: She continues to have blurry vision and getting intraocular injections. No increased urination, nausea, chest pain. She has dizziness and disequilibrium.  She was referred to Dr. Vickey Huger, whom she is already seeing for another problem.  She also had a recent inner ear infection and was treated with steroids.  Sugars increased. She has Afib  was hospitalized in 04/2022 for cardioversion.  She had a cardiac ablation in 07/2022, then cardioversion in 09/2022, and then again an ablation in 11/2022.  DM2: Reviewed HbA1c levels: Lab Results  Component Value Date   HGBA1C 5.8 (A) 07/15/2022   HGBA1C 6.4 (H) 05/10/2022   HGBA1C 6.0 (A) 02/09/2022   HGBA1C 6.7 (A) 10/09/2021   HGBA1C 7.5 (A) 06/06/2021   HGBA1C 7.7 (A) 02/04/2021   HGBA1C 7.4 (A) 10/22/2020   HGBA1C 6.7 (A) 07/23/2020   HGBA1C 7.3 (A) 04/15/2020   HGBA1C 8.7 (A) 02/14/2020   Pt is on a regimen of: - NPH/R 70/30 20-22 >> 28 units in am and 28-30 >> 24-26 >> 25 units before dinner >> 25-28 units in am and 25 units before dinner >>  25 units in am and 15 units before dinner >> 20 >> NPH 15 units in am and 8-10 units before dinner - Trulicity 3 mg weekly >> Ozempic 2 mg weekly -started 05/2021  No FH of MTC or personal h/o pancreatitis. She was on Metformin >> stopped b/c CKD.  Pt checks her sugars 4x a day with the Libre 2 CGM:  Previously:  Previously:   Lowest sugar was 70 >> 55 >> 60s >> 60s; she has hypoglycemia awareness in the 70s.   Highest sugar was 200s >> 190s >> 180s >> 200s.  Glucometer: Freestyle lite  Patient starting to improve her meals after her last HbA1c returned: - Brunch: scrambled egg + bacon + rye bread or cheerios + skim milk - Dinner: meat - grilled + veggies - Snacks: seldom - apple Drinks occasional diet drinks  -+ CKD- Dr. Wolfgang Phoenix, last BUN/creatinine:  Lab Results  Component Value Date   BUN 16 12/03/2022   BUN 18 10/23/2022   CREATININE 1.44 (H) 12/03/2022   CREATININE 1.47 (H) 10/23/2022  06/24/2020: BUN/creatinine 24/1.7 On Benicar 40.  -+ HL; last set of lipids: Lab Results  Component Value Date   CHOL 96 05/10/2022   HDL 30 (L) 05/10/2022   LDLCALC 35 05/10/2022   TRIG 156 (H) 05/10/2022   CHOLHDL 3.2 05/10/2022  07/18/2021: 126/160/41/58 07/17/2020: 121/146/38/58 On Lipitor 80.  - last eye exam was on 12/15/2022: Severe NPDR OD, macular edema. Dr Luciana Axe.  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  She has a history of BRAO.  She had eye surgery 03/2021 >> vision is better.  - she has numbness and tingling in her feet.  Latest foot exam was performed by podiatry 01/14/2023-Dr. Standiford. She has onychomycosis.  Pt has FH of DM in M and her family.  Hypothyroidism:  Pt is on levothyroxine  112 mcg daily (dose decreased to 08/2021), taken: - in am - fasting - at least 30 min from b'fast - no calcium - no iron - no multivitamins - no PPIs - not on Biotin  TSH levels were reviewed: Lab Results  Component Value Date   TSH 2.100 09/25/2022   TSH 0.571 03/17/2022   TSH 1.18 10/09/2021   TSH 0.183 (L) 08/25/2021   TSH 0.40 02/04/2021   TSH 0.22 (L) 10/22/2020   TSH 0.233 (L) 08/22/2020   TSH 2.410 02/22/2020   TSH 0.424 (L) 08/15/2019   TSH 0.059 (L) 12/17/2017   She has an extensive cardiac history.  Besides mentioned in HPI, she also has a history of heart block, tachybradycardia syndrome, pericardial effusion (history of  tamponade).  She is on amiodarone, Eliquis, Plavix, Lasix.    ROS: + see HPI  I reviewed pt's medications, allergies, PMH, social hx, family hx, and changes were documented in the history of present illness. Otherwise, unchanged from my initial visit note.  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 h/o PAF/flutter during admission in 2013 for PNA. b. PAF during adm for NSTEMI 07/2015, subsequent paroxysms since then.   B12 deficiency anemia    Branch retinal vein occlusion with macular edema of right eye 10/11/2019   Chronic renal failure, stage 3b (HCC) 09/23/2017   Coronary artery disease 11/30/2014   a. remote MI. b. h/o PTCA with scoring balloon to OM1 11/2014. c. NSTEMI 03/2015 s/p DES to prox-mid Cx. d. NSTEMI 07/2015 s/p scoring balloon/PTCA/DES to dRCA with PAF during that admission   Cutaneous lupus erythematosus    Early stage nonexudative age-related macular degeneration of right eye 02/27/2020   Essential hypertension    GERD (gastroesophageal reflux disease)    History of blood transfusion 1980's   2nd surgical procedures   Hypercholesteremia    Hypothyroidism    Myocardial infarction (HCC) 02/2012   NSTEMI (non-ST elevated myocardial infarction) (HCC) 04/02/2015   OSA (obstructive sleep apnea) 05/13/2016   Ovarian tumor    PAD (peripheral artery disease) (HCC)    a. s/p LE angio 2015; followed by Dr. Kirke Corin - managed medically.   Pericardial effusion    a. 06/2016 after ppm - s/p pericardiocentesis.   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 s/p Medtronic PPM 06/2016, c/b lead perf/pericardial effusion.   TIA (transient ischemic attack)    Type 2 diabetes with nephropathy (HCC) 02/29/2012   Past Surgical History:  Procedure Laterality Date   A-FLUTTER ABLATION N/A 12/08/2022   Procedure: A-FLUTTER ABLATION;  Surgeon: Regan Lemming, MD;  Location: [facility];  Service: Cardiovascular;  Laterality: N/A;   ABDOMINAL AORTAGRAM N/A 01/03/2014   Procedure: ABDOMINAL Ronny Flurry;  Surgeon: Iran Ouch, MD;  Location: [facility] CATH [facility];  Service: Cardiovascular;  Laterality: N/A;   ABDOMINAL HYSTERECTOMY  06/22/1970   "partial"   APPENDECTOMY  02/20/1969   ATRIAL FIBRILLATION ABLATION N/A 08/14/2022   Procedure: ATRIAL FIBRILLATION ABLATION;  Surgeon: Regan Lemming, MD;  Location: [facility];  Service: Cardiovascular;  Laterality: N/A;   ATRIAL FIBRILLATION ABLATION N/A 12/08/2022   Procedure: ATRIAL FIBRILLATION ABLATION;  Surgeon: Regan Lemming, MD;  Location: [facility];  Service: Cardiovascular;  Laterality: N/A;   CARDIAC CATHETERIZATION  06/22/2006   Tiny OM-2 with 90% narrowing. Med tx.   CARDIAC CATHETERIZATION N/A 11/30/2014   Procedure: Left Heart Cath and Coronary Angiography;  Surgeon: Lennette Bihari, MD; LAD 20%, CFX 50%, OM1 95%, right PLB 30%, LV normal    CARDIAC CATHETERIZATION N/A 11/30/2014   Procedure: Coronary Balloon Angioplasty;  Surgeon: Lennette Bihari, MD;  Angiosculpt scoring balloon and PTCA to the OM1 reducing stenosis from 95% to less than 10%   CARDIAC CATHETERIZATION N/A 04/03/2015   Procedure: Left Heart Cath and Coronary Angiography;  Surgeon: Dolores Patty, MD; dLAD 50%, CFX 90%, OM1 100%, PLA 15%, LVEDP 13      CARDIAC CATHETERIZATION N/A 04/03/2015   Procedure: Coronary Stent Intervention;  Surgeon: Tonny Bollman, MD; 3.0x18 mm Xience DES to the CFX     CARDIAC CATHETERIZATION N/A 08/02/2015   Procedure: Left Heart Cath and Coronary Angiography;  Surgeon: Lennette Bihari, MD;  Location[facility] INVASIVE CV LAB;  Service: Cardiovascular;  Laterality: N/A;   CARDIAC CATHETERIZATION N/A 08/02/2015   Procedure: Coronary Stent Intervention;  Surgeon: Lennette Bihari, MD;  Location: [facility];  Service: Cardiovascular;  Laterality: N/A;   CARDIAC CATHETERIZATION N/A 06/25/2016   Procedure: Pericardiocentesis;  Surgeon: Will Jorja Loa, MD;  Location: [facility];  Service: Cardiovascular;  Laterality: N/A;   cardiac stents     CARDIOVERSION N/A 12/15/2017   Procedure: CARDIOVERSION;  Surgeon: Elease Hashimoto Deloris Ping, MD;  Location: [facility] ENDOSCOPY;  Service: Cardiovascular;  Laterality: N/A;   CARDIOVERSION N/A 05/12/2022   Procedure: CARDIOVERSION;  Surgeon: Quintella Reichert, MD;  Location: [facility] ENDOSCOPY;  Service: Cardiovascular;  Laterality: N/A;   CARDIOVERSION N/A 09/30/2022   Procedure: CARDIOVERSION;  Surgeon: Thurmon Fair, MD;  Location: [facility];  Service: Cardiovascular;  Laterality: N/A;   CARDIOVERSION N/A 10/29/2022   Procedure: CARDIOVERSION;  Surgeon: Quintella Reichert, MD;  Location: [facility];  Service: Cardiovascular;  Laterality: N/A;   CHOLECYSTECTOMY OPEN  02/20/1989   COLONOSCOPY  06/23/2003   Dr. Karilyn Cota: pancolonic divericula, polyp, path unknown currently   COLONOSCOPY  06/22/2010   Dr. Darrick Penna: Normal TI, scattered diverticula in entire colon, small internal hemorrhoids, normal colon biopsies. Colonoscopy in 5-10 years.    COLONOSCOPY WITH PROPOFOL N/A 06/02/2021   pancolonic diverticulosis. Two 4-6 mm polyps in transverse colon. Sessile serrated and hyperplastic. 5 year surveillance if benefits outweigh the risks.   COLOSTOMY  05/23/1979   COLOSTOMY REVERSAL   11/21/1979   EP IMPLANTABLE DEVICE N/A 06/25/2016   Procedure: Lead Revision/Repair;  Surgeon: Will Jorja Loa, MD;  Location: [facility];  Service: Cardiovascular;  Laterality: N/A;   EP IMPLANTABLE DEVICE N/A 06/25/2016   Procedure: Pacemaker Implant;  Surgeon: Will Jorja Loa, MD;  Location: [facility];  Service: Cardiovascular;  Laterality: N/A;   EXCISIONAL HEMORRHOIDECTOMY  02/20/1969   EYE SURGERY Left 06/22/1998   "branch vein occlusion"   EYE SURGERY Left ~ 2001   "smoothed out wrinkle"   LEFT HEART CATH AND CORONARY  ANGIOGRAPHY N/A 05/11/2022   Procedure: LEFT HEART CATH AND CORONARY ANGIOGRAPHY;  Surgeon: Kathleene Hazel, MD;  Location: [facility];  Service: Cardiovascular;  Laterality: N/A;   LEFT OOPHORECTOMY  05/23/1979   nicked bowel, peritonitis, colostomy; colostomy reversed 1981    LOWER EXTREMITY ANGIOGRAM N/A 01/03/2014   Procedure: LOWER EXTREMITY ANGIOGRAM;  Surgeon: Iran Ouch, MD;  Location: [facility] CATH [facility];  Service: Cardiovascular;  Laterality: N/A;   Nuclear med stress test  10/21/2011   Small area of mild ischemia inferoapically.   PARTIAL HYSTERECTOMY  02/20/1969   left ovaries, then ovaries removed later due tumors    POLYPECTOMY  06/02/2021   Procedure: POLYPECTOMY;  Surgeon: Lanelle Bal, DO;  Location: [facility];  Service: Endoscopy;;   right eye surgery  03/2021   RIGHT OOPHORECTOMY  02/20/1969   TEE WITHOUT CARDIOVERSION N/A 05/12/2022   Procedure: TRANSESOPHAGEAL ECHOCARDIOGRAM (TEE);  Surgeon: Quintella Reichert, MD;  Location: [facility] ENDOSCOPY;  Service: Cardiovascular;  Laterality: N/A;   TEE WITHOUT CARDIOVERSION N/A 08/14/2022   Procedure: TRANSESOPHAGEAL ECHOCARDIOGRAM;  Surgeon: Regan Lemming, MD;  Location: [facility] INVASIVE CV LAB;  Service: Cardiovascular;  Laterality: N/A;   TEE WITHOUT CARDIOVERSION N/A 12/08/2022   Procedure: TRANSESOPHAGEAL ECHOCARDIOGRAM;  Surgeon: Regan Lemming, MD;   Location: [facility] INVASIVE CV LAB;  Service: Cardiovascular;  Laterality: N/A;   Social History   Socioeconomic History   Marital status: Married    Spouse name: Not on file   Number of children: Not on file   Years of education: Not on file   Highest education level: Not on file  Occupational History   Occupation: Retired    Associate Professor: RETIRED    Comment: insurance billing  Tobacco Use   Smoking status: Never    Passive exposure: Never   Smokeless tobacco: Never   Tobacco comments:    Never smoke 10/08/22  Vaping Use   Vaping status: Never Used  Substance and Sexual Activity   Alcohol use: No    Alcohol/week: 0.0 standard drinks of alcohol   Drug use: No   Sexual activity: Never    Birth control/protection: Surgical    Comment: hyst  Other Topics Concern   Not on file  Social History Narrative   Not on file   Social Determinants of Health   Financial Resource Strain: Not on file  Food Insecurity: No Food Insecurity (05/10/2022)   Hunger Vital Sign    Worried About Running Out of Food in the Last Year: Never true    Ran Out of Food in the Last Year: Never true  Transportation Needs: No Transportation Needs (05/10/2022)   PRAPARE - Administrator, Civil Service (Medical): No    Lack of Transportation (Non-Medical): No  Physical Activity: Not on file  Stress: Not on file  Social Connections: Not on file  Intimate Partner Violence: Not At Risk (05/10/2022)   Humiliation, Afraid, Rape, and Kick questionnaire    Fear of Current or Ex-Partner: No    Emotionally Abused: No    Physically Abused: No    Sexually Abused: No   Current Outpatient Medications on File Prior to Visit  Medication Sig Dispense Refill   acetaminophen (TYLENOL) 500 MG tablet Take 1,000 mg by mouth every 8 (eight) hours as needed for headache, moderate pain or mild pain.     ALPRAZolam (XANAX) 0.25 MG tablet Take 1 tablet (0.25 mg total) by mouth at bedtime as needed for anxiety. 30  tablet 1    amiodarone (PACERONE) 200 MG tablet TAKE ONE TABLET BY MOUTH ONCE DAILY 30 tablet 6   atorvastatin (LIPITOR) 80 MG tablet TAKE ONE TABLET BY MOUTH EVERY DAY AT 6:00PM 90 tablet 3   calcitRIOL (ROCALTROL) 0.25 MCG capsule Take 0.25 mcg by mouth every Monday, Wednesday, and Friday.     carvedilol (COREG) 25 MG tablet Take 1 tablet (25 mg total) by mouth 2 (two) times daily. 60 tablet 3   Cholecalciferol 50 MCG (2000 UT) TABS Take 4,000 Units by mouth at bedtime.     clopidogrel (PLAVIX) 75 MG tablet TAKE ONE TABLET (75MG  TOTAL) BY MOUTH DAILY 90 tablet 3   Continuous Blood Gluc Receiver (FREESTYLE LIBRE 2 READER) DEVI 1 each by Does not apply route daily. 1 each 0   Continuous Blood Gluc Sensor (FREESTYLE LIBRE 2 SENSOR) MISC USE ONE FOR FOURTEEN DAYS 6 each 3   ELIQUIS 5 MG TABS tablet TAKE ONE TABLET BY MOUTH TWICE DAILY 60 tablet 5   furosemide (LASIX) 40 MG tablet Take 40 mg by mouth daily as needed for fluid or edema.     glucose blood (FREESTYLE PRECISION NEO TEST) test strip Use as instructed 100 each 3   Insulin NPH, Human,, Isophane, (NOVOLIN N FLEXPEN) 100 UNIT/ML Kiwkpen Inject 10-15 Units into the skin as directed. Inject 15 units subcutaneously in the morning & inject 10 units subcutaneously at night.     levothyroxine (SYNTHROID) 112 MCG tablet TAKE ONE TABLET BY MOUTH ONCE DAILY 90 tablet 2   nitroGLYCERIN (NITROSTAT) 0.4 MG SL tablet Place 1 tablet (0.4 mg total) under the tongue every 5 (five) minutes x 3 doses as needed for chest pain (If not relief after 3rd dose, call 911 or go to ED). 25 tablet 0   rOPINIRole (REQUIP) 0.5 MG tablet Take 1 tablet (0.5 mg total) by mouth at bedtime. 30 tablet 5   Semaglutide, 2 MG/DOSE, 8 MG/3ML SOPN Inject 2 mg as directed once a week. (Patient not taking: Reported on 01/05/2023) 9 mL 3   No current facility-administered medications on file prior to visit.   Allergies  Allergen Reactions   Penicillins Hives    Has patient had a PCN reaction  causing immediate rash, facial/tongue/throat swelling, SOB or lightheadedness with hypotension: Yes Has patient had a PCN reaction causing severe rash involving mucus membranes or skin necrosis: No Has patient had a PCN reaction that required hospitalization No Has patient had a PCN reaction occurring within the last 10 years: No If all of the above answers are "NO", then may proceed with Cephalosporin use.   Percocet [Oxycodone-Acetaminophen] Nausea And Vomiting   Family History  Problem Relation Age of Onset   Heart disease Mother        deceased   Heart disease Father        deceased, heart disease   Diabetes Brother    Heart disease Brother    Thyroid disease Brother    Heart disease Sister    Heart disease Brother    Thyroid disease Brother    Lupus Daughter    Colon cancer Neg Hx    PE: BP (!) 146/80   Pulse 78   Ht 5\' 3"  (1.6 m)   Wt 120 lb 12.8 oz (54.8 kg)   SpO2 98%   BMI 21.40 kg/m  Wt Readings from Last 3 Encounters:  02/01/23 120 lb 12.8 oz (54.8 kg)  01/05/23 121 lb (54.9 kg)  12/08/22 129 lb (58.5  kg)   Constitutional: normal weight, in NAD Eyes: no exophthalmos ENT: no masses palpated in neck, no cervical lymphadenopathy Cardiovascular: RRR, No MRG Respiratory: CTA B Musculoskeletal: no deformities Skin: no rashes Neurological: + tremor with outstretched hands - L>R  ASSESSMENT: 1. DM2, insulin-dependent, now more controlled, withcomplications - CAD, s/p NSTEMI 07/2015 and 03/2015, s/p PTCA and DES - CHF - PAF/flutter - PAD - cerebrovascular disease - s/p TIA - CKD  2. HL  3.  Hypothyroidism  PLAN:  1. Patient with longstanding, previously uncontrolled type 2 diabetes, with improved control on the regimen of intermediate acting (previously premixed) insulin twice a day and weekly GLP-1 receptor agonist.  Her sugars improved after changing from Trulicity to Ozempic. -we previously discussed about adding an SGLT2 inhibitor in the past but I  advised her to check with her nephrologist first.  She saw Dr. Wolfgang Phoenix in 04/2020 and he wanted [place] to hold off using an SGLT2 inhibitor as her kidney function was trending down.  The GFR was a little worse at the check from 05/2022. -At last visit, HbA1c was better, and 5.8%.  Reviewing the CGM trends, sugars are well-controlled, fluctuating mostly within the target range but with occasional lows especially overnight.  We decreased the dose of her premixed insulin at that time.   CGM interpretation: -At today's visit, we reviewed her CGM downloads: It appears that 88% of values are in target range (goal >70%), while 0% are higher than 180 (goal <25%), and 12% are lower than 70 (goal <4%).  The calculated average blood sugar is 105.   -Reviewing the CGM trends, sugars appear to be at target, but with lows overnight.  We have data loss in the evening.  I advised the patient to reduce her evening NPH and to move it at bedtime to reduce nighttime hypoglycemia.  Also, due to the significant weight loss and decrease of appetite, I advised her to reduce Ozempic to only 1 mg weekly.  She will let me know in 1 month if her weight loss stopped and she had some return of her appetite.  If not, we may need to decrease the dose even further.  She would prefer to come off due to price so we may need to stop this in the future. -Since last visit, she lost ~15 pounds.  At today's visit we again discussed about possibly reducing the dose of Ozempic from 2 mg to 1 mg weekly. - I suggested to:  Patient Instructions  Please use the following regimen: - NPH/R 70/30 15 units in am and 5 units at bedtime  Please decrease: - Ozempic 1 mg weekly (36 clicks)  Continue Levothyroxine 112 mcg daily.  Take the thyroid hormone every day, with water, at least 30 minutes before breakfast, separated by at least 4 hours from: - acid reflux medications - calcium - iron - multivitamins  Please return in 4-6 months.  - we checked  her HbA1c: 6.4% (higher, but still at goal) - advised to check sugars at different times of the day - 4x a day, rotating check times - advised for yearly eye exams >> she is UTD - return to clinic in 4-6 months  2. HL - Reviewed latest lipid panel from 04/2022: LDL at goal, HDL slightly low: Lab Results  Component Value Date   CHOL 96 05/10/2022   HDL 30 (L) 05/10/2022   LDLCALC 35 05/10/2022   TRIG 156 (H) 05/10/2022   CHOLHDL 3.2 05/10/2022  - Continues Lipitor  80 mg daily without side effects  3.  Hypothyroidism - latest thyroid labs reviewed with pt. >> normal: Lab Results  Component Value Date   TSH 2.100 09/25/2022  - she continues on LT4 112 mcg daily - pt feels good on this dose. - we discussed about taking the thyroid hormone every day, with water, >30 minutes before breakfast, separated by >4 hours from acid reflux medications, calcium, iron, multivitamins. Pt. is taking it correctly.  Carlus Pavlov, MD PhD [facility]

## 2023-02-02 DIAGNOSIS — H34831 Tributary (branch) retinal vein occlusion, right eye, with macular edema: Secondary | ICD-10-CM | POA: Diagnosis not present

## 2023-02-02 DIAGNOSIS — H353124 Nonexudative age-related macular degeneration, left eye, advanced atrophic with subfoveal involvement: Secondary | ICD-10-CM | POA: Diagnosis not present

## 2023-02-02 DIAGNOSIS — H353111 Nonexudative age-related macular degeneration, right eye, early dry stage: Secondary | ICD-10-CM | POA: Diagnosis not present

## 2023-02-02 DIAGNOSIS — E113411 Type 2 diabetes mellitus with severe nonproliferative diabetic retinopathy with macular edema, right eye: Secondary | ICD-10-CM | POA: Diagnosis not present

## 2023-02-04 ENCOUNTER — Other Ambulatory Visit: Payer: Self-pay | Admitting: Neurology

## 2023-02-04 DIAGNOSIS — G252 Other specified forms of tremor: Secondary | ICD-10-CM | POA: Diagnosis not present

## 2023-02-09 DIAGNOSIS — I509 Heart failure, unspecified: Secondary | ICD-10-CM | POA: Diagnosis not present

## 2023-02-09 DIAGNOSIS — G620 Drug-induced polyneuropathy: Secondary | ICD-10-CM | POA: Diagnosis not present

## 2023-02-09 DIAGNOSIS — R251 Tremor, unspecified: Secondary | ICD-10-CM | POA: Diagnosis not present

## 2023-02-09 DIAGNOSIS — E1151 Type 2 diabetes mellitus with diabetic peripheral angiopathy without gangrene: Secondary | ICD-10-CM | POA: Diagnosis not present

## 2023-02-09 DIAGNOSIS — Z794 Long term (current) use of insulin: Secondary | ICD-10-CM | POA: Diagnosis not present

## 2023-02-09 DIAGNOSIS — I110 Hypertensive heart disease with heart failure: Secondary | ICD-10-CM | POA: Diagnosis not present

## 2023-02-15 ENCOUNTER — Other Ambulatory Visit: Payer: Self-pay | Admitting: Internal Medicine

## 2023-02-17 ENCOUNTER — Other Ambulatory Visit: Payer: Self-pay

## 2023-02-17 MED ORDER — OZEMPIC (1 MG/DOSE) 4 MG/3ML ~~LOC~~ SOPN: 1.0000 mg | PEN_INJECTOR | SUBCUTANEOUS | 2 refills | Status: DC

## 2023-02-20 DIAGNOSIS — D6869 Other thrombophilia: Secondary | ICD-10-CM | POA: Diagnosis not present

## 2023-02-20 DIAGNOSIS — I482 Chronic atrial fibrillation, unspecified: Secondary | ICD-10-CM | POA: Diagnosis not present

## 2023-02-20 DIAGNOSIS — N184 Chronic kidney disease, stage 4 (severe): Secondary | ICD-10-CM | POA: Diagnosis not present

## 2023-02-20 DIAGNOSIS — E1122 Type 2 diabetes mellitus with diabetic chronic kidney disease: Secondary | ICD-10-CM | POA: Diagnosis not present

## 2023-02-20 DIAGNOSIS — I130 Hypertensive heart and chronic kidney disease with heart failure and stage 1 through stage 4 chronic kidney disease, or unspecified chronic kidney disease: Secondary | ICD-10-CM | POA: Diagnosis not present

## 2023-02-28 DIAGNOSIS — G4733 Obstructive sleep apnea (adult) (pediatric): Secondary | ICD-10-CM | POA: Diagnosis not present

## 2023-03-01 DIAGNOSIS — Z23 Encounter for immunization: Secondary | ICD-10-CM | POA: Diagnosis not present

## 2023-03-02 ENCOUNTER — Other Ambulatory Visit (HOSPITAL_COMMUNITY): Payer: Self-pay

## 2023-03-02 ENCOUNTER — Telehealth: Payer: Self-pay

## 2023-03-02 NOTE — Telephone Encounter (Signed)
Pharmacy Patient Advocate Encounter   Received notification from CoverMyMeds that prior authorization for Ozempic is required/requested.   Insurance verification completed.   The patient is insured through [employer]/ [employer] .   Per test claim: PA required; PA submitted to [employer]/ [employer] via CoverMyMeds Key/confirmation #/EOC [id number] Status is pending

## 2023-03-08 ENCOUNTER — Encounter: Payer: Self-pay | Admitting: Internal Medicine

## 2023-03-09 ENCOUNTER — Other Ambulatory Visit: Payer: Self-pay | Admitting: Neurology

## 2023-03-09 ENCOUNTER — Telehealth: Payer: Self-pay | Admitting: Neurology

## 2023-03-09 MED ORDER — FREESTYLE LIBRE 2 SENSOR MISC: 3 refills | Status: DC

## 2023-03-09 NOTE — Telephone Encounter (Signed)
Pt is requesting a refill for ALPRAZolam (XANAX) 0.25 MG tablet [[id number]] to be sent to [facility]

## 2023-03-09 NOTE — Addendum Note (Signed)
Addended by: Judi Cong on: 03/09/2023 12:26 PM   Modules accepted: Orders

## 2023-03-09 NOTE — Telephone Encounter (Signed)
error 

## 2023-03-15 ENCOUNTER — Ambulatory Visit: Payer: PPO | Attending: Cardiology | Admitting: Cardiology

## 2023-03-15 ENCOUNTER — Ambulatory Visit: Payer: PPO | Admitting: Cardiology

## 2023-03-15 ENCOUNTER — Encounter: Payer: Self-pay | Admitting: Cardiology

## 2023-03-15 VITALS — BP 140/84 | HR 54 | Ht 63.0 in | Wt 124.0 lb

## 2023-03-15 DIAGNOSIS — I10 Essential (primary) hypertension: Secondary | ICD-10-CM

## 2023-03-15 DIAGNOSIS — I495 Sick sinus syndrome: Secondary | ICD-10-CM | POA: Diagnosis not present

## 2023-03-15 DIAGNOSIS — I4819 Other persistent atrial fibrillation: Secondary | ICD-10-CM

## 2023-03-15 DIAGNOSIS — I484 Atypical atrial flutter: Secondary | ICD-10-CM | POA: Diagnosis not present

## 2023-03-15 DIAGNOSIS — D6869 Other thrombophilia: Secondary | ICD-10-CM | POA: Diagnosis not present

## 2023-03-15 DIAGNOSIS — I2510 Atherosclerotic heart disease of native coronary artery without angina pectoris: Secondary | ICD-10-CM

## 2023-03-15 LAB — CUP PACEART INCLINIC DEVICE CHECK
Date Time Interrogation Session: 20240923132440
Implantable Lead Connection Status: 753985
Implantable Lead Connection Status: 753985
Implantable Lead Implant Date: 20180104
Implantable Lead Implant Date: 20180104
Implantable Lead Location: 753859
Implantable Lead Location: 753860
Implantable Lead Model: 5076
Implantable Lead Model: 5076
Implantable Pulse Generator Implant Date: 20180104

## 2023-03-15 MED ORDER — ALPRAZOLAM 0.25 MG PO TABS: 0.1250 mg | ORAL_TABLET | Freq: Every evening | ORAL | 0 refills | Status: DC | PRN

## 2023-03-15 NOTE — Progress Notes (Signed)
Electrophysiology Office Note:   Date:  03/15/2023  ID:  ARSHI CHUC, DOB May 31, 1946, MRN [id number]  Primary Cardiologist: Nona Dell, MD Electrophysiologist: Regan Lemming, MD      History of Present Illness:   Virginia Huber is a 77 y.o. female with h/o coronary artery disease with OM and circumflex PCI, atrial fibrillation, sleep apnea, sick sinus syndrome seen today for routine electrophysiology followup.   Since last being seen in our clinic the patient reports doing overall well.  She does have episodes of fatigue.  She also had some tremulousness and thus amiodarone was stopped.  It is improved slightly.  She has an appointment upcoming with neurology.  She has had a few episodes of atrial flutter since her ablation, but is overall remained in normal rhythm.  she denies chest pain, palpitations, dyspnea, PND, orthopnea, nausea, vomiting, dizziness, syncope, edema, weight gain, or early satiety.   Review of systems complete and found to be negative unless listed in HPI.      EP Information / Studies Reviewed:    EKG is ordered today. Personal review as below.  EKG Interpretation Date/Time:  Monday March 15 2023 12:28:13 EDT Ventricular Rate:  54 PR Interval:  240 QRS Duration:  132 QT Interval:  504 QTC Calculation: 477 R Axis:   -60  Text Interpretation: Atrial-paced rhythm with prolonged AV conduction Left axis deviation Left ventricular hypertrophy with QRS widening and repolarization abnormality ( R in aVL , Cornell product , Romhilt-Estes ) When compared with ECG of 05-Jan-2023 10:22, No significant change since last tracing Confirmed by Emely Fahy ([id number]) on 03/15/2023 2:36:41 PM   PPM Interrogation-  reviewed in detail today,  See PACEART report.  Device History: Medtronic Dual Chamber PPM implanted 06/25/2016 for Sinus Node Dysfunction  Risk Assessment/Calculations:    CHA2DS2-VASc Score = 6   This indicates a 9.7% annual risk of  stroke. The patient's score is based upon: CHF History: 1 HTN History: 1 Diabetes History: 0 Stroke History: 0 Vascular Disease History: 1 Age Score: 2 Gender Score: 1            Physical Exam:   VS:  BP (!) 140/84 (BP Location: Left Arm, Patient Position: Sitting, Cuff Size: Normal)   Pulse (!) 54   Ht 5\' 3"  (1.6 m)   Wt 124 lb (56.2 kg)   SpO2 96%   BMI 21.97 kg/m    Wt Readings from Last 3 Encounters:  03/15/23 124 lb (56.2 kg)  02/01/23 120 lb 12.8 oz (54.8 kg)  01/05/23 121 lb (54.9 kg)     GEN: Well nourished, well developed in no acute distress NECK: No JVD; No carotid bruits CARDIAC: Regular rate and rhythm, no murmurs, rubs, gallops RESPIRATORY:  Clear to auscultation without rales, wheezing or rhonchi  ABDOMEN: Soft, non-tender, non-distended EXTREMITIES:  No edema; No deformity   ASSESSMENT AND PLAN:    SND s/p Medtronic PPM  Normal PPM function See Pace Art report No changes today  2.  Persistent atrial fibrillation/flutter:  Status post ablation 2324 with repeat ablation 12/08/2022.  She has mostly been in sinus rhythm with minimal episodes of atrial fibrillation or flutter.  Sable Knoles continue with current management.  3.  Secondary hypercoagulable state: Currently on Eliquis for atrial fibrillation  4.  Coronary artery disease: No current chest pain.  Plan per primary cardiology  5.  Hypertension: Mildly elevated today.  Usually well-controlled.  Disposition:   Follow up with EP APP in 6 months  Signed, Kharizma Lesnick Jorja Loa, MD

## 2023-03-15 NOTE — Patient Instructions (Signed)
Medication Instructions:  Your physician recommends that you continue on your current medications as directed. Please refer to the Current Medication list given to you today.  *If you need a refill on your cardiac medications before your next appointment, please call your pharmacy*   Lab Work: None ordered If you have labs (blood work) drawn today and your tests are completely normal, you will receive your results only by: MyChart Message (if you have MyChart) OR A paper copy in the mail If you have any lab test that is abnormal or we need to change your treatment, we will call you to review the results.   Testing/Procedures: None ordered   Follow-Up: At [facility], you and your health needs are our priority.  As part of our continuing mission to provide you with exceptional heart care, we have created designated Provider Care Teams.  These Care Teams include your primary Cardiologist (physician) and Advanced Practice Providers (APPs -  Physician Assistants and Nurse Practitioners) who all work together to provide you with the care you need, when you need it.  Your physician recommends that you move your appointment 9/25 to : November with Dr. Diona Browner   Your next appointment:   6 month(s)  The format for your next appointment:   In Person  Provider:   Loman Brooklyn, MD    Thank you for choosing [facility]!!   Dory Horn, RN [phone]

## 2023-03-17 ENCOUNTER — Ambulatory Visit: Payer: PPO | Admitting: Cardiology

## 2023-03-23 ENCOUNTER — Encounter: Payer: Self-pay | Admitting: Neurology

## 2023-03-23 ENCOUNTER — Ambulatory Visit: Payer: PPO | Admitting: Neurology

## 2023-03-23 VITALS — BP 192/75 | HR 65 | Ht 63.0 in | Wt 118.0 lb

## 2023-03-23 DIAGNOSIS — G4733 Obstructive sleep apnea (adult) (pediatric): Secondary | ICD-10-CM | POA: Diagnosis not present

## 2023-03-23 DIAGNOSIS — E1142 Type 2 diabetes mellitus with diabetic polyneuropathy: Secondary | ICD-10-CM | POA: Diagnosis not present

## 2023-03-23 DIAGNOSIS — N1832 Chronic kidney disease, stage 3b: Secondary | ICD-10-CM

## 2023-03-23 DIAGNOSIS — G251 Drug-induced tremor: Secondary | ICD-10-CM

## 2023-03-23 DIAGNOSIS — Z794 Long term (current) use of insulin: Secondary | ICD-10-CM | POA: Diagnosis not present

## 2023-03-23 DIAGNOSIS — H353124 Nonexudative age-related macular degeneration, left eye, advanced atrophic with subfoveal involvement: Secondary | ICD-10-CM | POA: Diagnosis not present

## 2023-03-23 DIAGNOSIS — H34831 Tributary (branch) retinal vein occlusion, right eye, with macular edema: Secondary | ICD-10-CM | POA: Diagnosis not present

## 2023-03-23 DIAGNOSIS — G620 Drug-induced polyneuropathy: Secondary | ICD-10-CM | POA: Diagnosis not present

## 2023-03-23 DIAGNOSIS — T462X5A Adverse effect of other antidysrhythmic drugs, initial encounter: Secondary | ICD-10-CM | POA: Diagnosis not present

## 2023-03-23 DIAGNOSIS — H353111 Nonexudative age-related macular degeneration, right eye, early dry stage: Secondary | ICD-10-CM | POA: Diagnosis not present

## 2023-03-23 DIAGNOSIS — I480 Paroxysmal atrial fibrillation: Secondary | ICD-10-CM

## 2023-03-23 DIAGNOSIS — E113411 Type 2 diabetes mellitus with severe nonproliferative diabetic retinopathy with macular edema, right eye: Secondary | ICD-10-CM | POA: Diagnosis not present

## 2023-03-23 DIAGNOSIS — I5030 Unspecified diastolic (congestive) heart failure: Secondary | ICD-10-CM | POA: Diagnosis not present

## 2023-03-23 LAB — HM DIABETES EYE EXAM

## 2023-03-23 NOTE — Progress Notes (Signed)
.        Provider:  Melvyn Novas, MD  Primary Care Physician:  Assunta Found, MD [address] [place] [address]     Referring Provider: Johnn Hai, Np [address] [place],  [place] [address]          Chief Complaint according to patient   Patient presents with:     New Patient (Initial Visit)           HISTORY OF PRESENT ILLNESS:  Virginia Huber is a 77 y.o. female patient who is here for revisit 03/23/2023 to address a tremor and balance issues. She has finished her treatment on Amiodarone which is associated with both. She has been trembling, has fallen, all this could be neuropathy related  and neuropathy on Amiodarone will not simply resolve.  .  Chief concern according to patient :  " tremor and balance problems, had  3 cardioversion, ablation to treat atrial fib and has now only in atrial flutter- and could get off amiodarone last month.  Her left hand shows a high amplitude tremor but her tremogram was benign. She is right hand dominant.  Her voice is crisp and clear- but she drools now. She has to wipe her chin and take g her CPAP off at night.  She has been having trouble swallowing.      Virginia Huber is a 77 y.o. female patient who is here for revisit 11/03/2022 for  CPAP follow up.  " I have a hard time with my hard since November 2023, cardioversion for atrial fibrillation, in February I had an ablation, but relapsed into a- fib. I am on Eloquis an plavix,  EF 35 %, with severe fatigue- I just get wiped out by gardening, even just walking. Sleep is not better- still on amiodarone."    The patient endorsed the Epworth Sleepiness Scale today at 3 out of 24 points and fatigue severity scale at 38 out of 63 points.  She is much more fatigued than actually sleepy and sleep is actually of less quality recently.  She is a little bit frustrated and certainly also worried about her health but she is not depressed.     Looking at the patient's  home sleep test result from January she had 23% REM sleep total sleep time duration of 8 hours 18 minutes and then calculated AHI of 34 which during REM sleep increased to 44.5/h.  Pulse rate varied also they cannot see cardiac rhythm on these home sleep test devices she did have a prolonged time with low oxygen to desired and her nadir of oxygen was 79%.  So she was placed on CPAP and has been a compliant user.  Average user time is 7 hours 26 minutes on days used the CPAP is set at 10 cm of water pressure with 3 cm EPR, her residual AHI is 2.2/h and her 95th percentile air leak is 10 L a minute which is mild.  So I am happy with her compliance and her reduction in AHI and I attribute the high level of fatigue to the reduced ejection fraction by atrial fibrillation, basically she is in diastolic heart failure.                                        05/07/22 ALL:  [name] returns for follow up for OSA on CPAP. She is  doing well on therapy. She does not like to use her machine but understands medical need. She does not feel any better when using CPAP. If she travels she does not take it with her and feels that she sleeps well.    Bilateral hand tremor seems to be worsening. She is scheduled for an ablation in 07/2022 and hoping to discontinue amiodarone. She is hoping tremor improves.           CD : [name] is a 77 y.o. female , seen here as a referral from Dr. Prentice Docker, Mrs. Southgate was originally  referred by her cardiologist for a sleep study to the [facility] location, she completed the sleep study but then was never given the results, a phone call or a follow-up appointment. She stated that she had similar experiences with the physician before when she had suffered a TIA and her follow-up appointments were not properly made and results not discussed. I was surprised to see a patient who just had a sleep study on 04/12/2016 30 days later in my office to follow-up on a  study that I have not performed. Based on the report the patient has a body mass index of 83, is 77 years of age, and went to the sleep lab 04/12/2016. Her AHI was only 10 per hour which constitutes mild sleep apnea. In supine sleep her AHI was 14.3, the lowest oxygen saturation at night was 86% and the technologist noted loud snoring. She slept about half of the night in supine sleep position she reached REM sleep with an AHI of 29.3 per hour.    Review of Systems: Out of a complete 14 system review, the patient complains of only the following symptoms, and all other reviewed systems are negative.:  " tremor and balance problems, had  3 cardioversion, ablation to treat atrial fib and has now only in atrial flutter- and could get off amiodarone last month.  Her left hand shows a high amplitude tremor but her tremogram was benign. She is right hand dominant.  Her voice is crisp and clear- but she drools now. She has to wipe her chin and take g her CPAP off at night.  She has been having trouble swallowing.   Social History   Socioeconomic History   Marital status: Married    Spouse name: Not on file   Number of children: Not on file   Years of education: Not on file   Highest education level: Not on file  Occupational History   Occupation: Retired    Associate Professor: RETIRED    Comment: insurance billing  Tobacco Use   Smoking status: Never    Passive exposure: Never   Smokeless tobacco: Never   Tobacco comments:    Never smoke 10/08/22  Vaping Use   Vaping status: Never Used  Substance and Sexual Activity   Alcohol use: No    Alcohol/week: 0.0 standard drinks of alcohol   Drug use: No   Sexual activity: Never    Birth control/protection: Surgical    Comment: hyst  Other Topics Concern   Not on file  Social History Narrative   Not on file   Social Determinants of Health   Financial Resource Strain: Not on file  Food Insecurity: No Food Insecurity (05/10/2022)   Hunger Vital  Sign    Worried About Running Out of Food in the Last Year: Never true    Ran Out of Food in the Last Year: Never true  Transportation Needs: No Transportation Needs (05/10/2022)   PRAPARE - Administrator, Civil Service (Medical): No    Lack of Transportation (Non-Medical): No  Physical Activity: Not on file  Stress: Not on file  Social Connections: Not on file    Family History  Problem Relation Age of Onset   Heart disease Mother        deceased   Heart disease Father        deceased, heart disease   Diabetes Brother    Heart disease Brother    Thyroid disease Brother    Heart disease Sister    Heart disease Brother    Thyroid disease Brother    Lupus Daughter    Colon cancer Neg Hx     Past Medical History:  Diagnosis Date   (HFpEF) heart failure with preserved ejection fraction (HCC)    Advanced nonexudative age-related macular degeneration of left eye with subfoveal involvement 06/26/2020   Ongoing, accounts for acuity   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 h/o PAF/flutter during admission in 2013 for PNA. b. PAF during adm for NSTEMI 07/2015, subsequent paroxysms since then.   B12 deficiency anemia    Branch retinal vein occlusion with macular edema of right eye 10/11/2019   Chronic renal failure, stage 3b (HCC) 09/23/2017   Coronary artery disease 11/30/2014   a. remote MI. b. h/o PTCA with scoring balloon to OM1 11/2014. c. NSTEMI 03/2015 s/p DES to prox-mid Cx. d. NSTEMI 07/2015 s/p scoring balloon/PTCA/DES to dRCA with PAF during that admission   Cutaneous lupus erythematosus    Early stage nonexudative age-related macular degeneration of right eye 02/27/2020   Essential hypertension    GERD (gastroesophageal reflux disease)    History of blood transfusion 1980's   2nd surgical procedures   Hypercholesteremia    Hypothyroidism    Myocardial infarction (HCC) 02/2012   NSTEMI  (non-ST elevated myocardial infarction) (HCC) 04/02/2015   OSA (obstructive sleep apnea) 05/13/2016   Ovarian tumor    PAD (peripheral artery disease) (HCC)    a. s/p LE angio 2015; followed by Dr. Kirke Corin - managed medically.   Pericardial effusion    a. 06/2016 after ppm - s/p pericardiocentesis.   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 s/p Medtronic PPM 06/2016, c/b lead perf/pericardial effusion.   TIA (transient ischemic attack)    Type 2 diabetes with nephropathy (HCC) 02/29/2012    Past Surgical History:  Procedure Laterality Date   A-FLUTTER ABLATION N/A 12/08/2022   Procedure: A-FLUTTER ABLATION;  Surgeon: Regan Lemming, MD;  [facility]: [facility] INVASIVE [facility] LAB;  Service: Cardiovascular;  Laterality: N/A;   ABDOMINAL AORTAGRAM N/A 01/03/2014   Procedure: ABDOMINAL Ronny Flurry;  Surgeon: Iran Ouch, MD;  Location: [facility] CATH LAB;  Service: Cardiovascular;  Laterality: N/A;   ABDOMINAL HYSTERECTOMY  06/22/1970   "partial"   APPENDECTOMY  02/20/1969   ATRIAL FIBRILLATION ABLATION N/A 08/14/2022   Procedure: ATRIAL FIBRILLATION ABLATION;  Surgeon: Regan Lemming, MD;  Location: [facility];  Service: Cardiovascular;  Laterality: N/A;  ATRIAL FIBRILLATION ABLATION N/A 12/08/2022   Procedure: ATRIAL FIBRILLATION ABLATION;  Surgeon: Regan Lemming, MD;  [facility]: [facility];  Service: Cardiovascular;  Laterality: N/A;   CARDIAC CATHETERIZATION  06/22/2006   Tiny OM-2 with 90% narrowing. Med tx.   CARDIAC CATHETERIZATION N/A 11/30/2014   Procedure: Left Heart Cath and Coronary Angiography;   Surgeon: Lennette Bihari, MD; LAD 20%, CFX 50%, OM1 95%, right PLB 30%, LV normal    CARDIAC CATHETERIZATION N/A 11/30/2014   Procedure: Coronary Balloon Angioplasty;  Surgeon: Lennette Bihari, MD;  Angiosculpt scoring balloon and PTCA to the OM1 reducing stenosis from 95% to less than 10%   CARDIAC CATHETERIZATION N/A 04/03/2015   Procedure: Left Heart Cath and Coronary Angiography;  Surgeon: Dolores Patty, MD; dLAD 50%, CFX 90%, OM1 100%, PLA 15%, LVEDP 13     CARDIAC CATHETERIZATION N/A 04/03/2015   Procedure: Coronary Stent Intervention;  Surgeon: Tonny Bollman, MD; 3.0x18 mm Xience DES to the CFX     CARDIAC CATHETERIZATION N/A 08/02/2015   Procedure: Left Heart Cath and Coronary Angiography;  Surgeon: Lennette Bihari, MD;  Location: [facility] INVASIVE CV LAB;  Service: Cardiovascular;  Laterality: N/A;   CARDIAC CATHETERIZATION N/A 08/02/2015   Procedure: Coronary Stent Intervention;  Surgeon: Lennette Bihari, MD;  [facility];  Service: Cardiovascular;  Laterality: N/A;   CARDIAC CATHETERIZATION N/A 06/25/2016   Procedure: Pericardiocentesis;  Surgeon: Will Jorja Loa, MD;  [facility]: [facility];  Service: Cardiovascular;  Laterality: N/A;   cardiac stents     CARDIOVERSION N/A 12/15/2017   Procedure: CARDIOVERSION;  Surgeon: Elease Hashimoto Deloris Ping, MD;  Location: [facility] ENDOSCOPY;  Service: Cardiovascular;  Laterality: N/A;   CARDIOVERSION N/A 05/12/2022   Procedure: CARDIOVERSION;  Surgeon: Quintella Reichert, MD;  Location: [facility] ENDOSCOPY;  Service: Cardiovascular;  Laterality: N/A;   CARDIOVERSION N/A 09/30/2022   Procedure: CARDIOVERSION;  Surgeon: Thurmon Fair, MD;  [facility]: [facility];  Service: Cardiovascular;  Laterality: N/A;   CARDIOVERSION N/A 10/29/2022   Procedure: CARDIOVERSION;  Surgeon: Quintella Reichert, MD;  [facility]: [facility];  Service: Cardiovascular;  Laterality: N/A;   CHOLECYSTECTOMY OPEN  02/20/1989   COLONOSCOPY  06/23/2003   Dr.  Karilyn Cota: pancolonic divericula, polyp, path unknown currently   COLONOSCOPY  06/22/2010   Dr. Darrick Penna: Normal TI, scattered diverticula in entire colon, small internal hemorrhoids, normal colon biopsies. Colonoscopy in 5-10 years.    COLONOSCOPY WITH PROPOFOL N/A 06/02/2021   pancolonic diverticulosis. Two 4-6 mm polyps in transverse colon. Sessile serrated and hyperplastic. 5 year surveillance if benefits outweigh the risks.   COLOSTOMY  05/23/1979   COLOSTOMY REVERSAL  11/21/1979   EP IMPLANTABLE DEVICE N/A 06/25/2016   Procedure: Lead Revision/Repair;  Surgeon: Will Jorja Loa, MD;  [facility]: [facility];  Service: Cardiovascular;  Laterality: N/A;   EP IMPLANTABLE DEVICE N/A 06/25/2016   Procedure: Pacemaker Implant;  Surgeon: Will Jorja Loa, MD;  [facility]: [facility];  Service: Cardiovascular;  Laterality: N/A;   EXCISIONAL HEMORRHOIDECTOMY  02/20/1969   EYE SURGERY Left 06/22/1998   "branch vein occlusion"   EYE SURGERY Left ~ 2001   "smoothed out wrinkle"   LEFT HEART CATH AND CORONARY ANGIOGRAPHY N/A 05/11/2022   Procedure: LEFT HEART CATH AND CORONARY ANGIOGRAPHY;  Surgeon: Kathleene Hazel, MD;  Location: [facility];  Service: Cardiovascular;  Laterality: N/A;   LEFT OOPHORECTOMY  05/23/1979   nicked bowel, peritonitis, colostomy;  colostomy reversed 1981    LOWER EXTREMITY ANGIOGRAM N/A 01/03/2014   Procedure: LOWER EXTREMITY ANGIOGRAM;  Surgeon: Iran Ouch, MD;  Location: [facility] CATH LAB;  Service: Cardiovascular;  Laterality: N/A;   Nuclear med stress test  10/21/2011   Small area of mild ischemia inferoapically.   PARTIAL HYSTERECTOMY  02/20/1969   left ovaries, then ovaries removed later due tumors    POLYPECTOMY  06/02/2021   Procedure: POLYPECTOMY;  Surgeon: Lanelle Bal, DO;  Location: [facility]O [facility];  Service: Endoscopy;;   right eye surgery  03/2021   RIGHT OOPHORECTOMY  02/20/1969   TEE WITHOUT CARDIOVERSION N/A 05/12/2022    Procedure: TRANSESOPHAGEAL ECHOCARDIOGRAM (TEE);  Surgeon: Quintella Reichert, MD;  Location: [facility] ENDOSCOPY;  Service: Cardiovascular;  Laterality: N/A;   TEE WITHOUT CARDIOVERSION N/A 08/14/2022   Procedure: TRANSESOPHAGEAL ECHOCARDIOGRAM;  Surgeon: Regan Lemming, MD;  Location: [facility] INVASIVE CV LAB;  Service: Cardiovascular;  Laterality: N/A;   TEE WITHOUT CARDIOVERSION N/A 12/08/2022   Procedure: TRANSESOPHAGEAL ECHOCARDIOGRAM;  Surgeon: Regan Lemming, MD;  Location: [facility] INVASIVE CV LAB;  Service: Cardiovascular;  Laterality: N/A;     Current Outpatient Medications on File Prior to Visit  Medication Sig Dispense Refill   acetaminophen (TYLENOL) 500 MG tablet Take 1,000 mg by mouth every 8 (eight) hours as needed for headache, moderate pain or mild pain.     ALPRAZolam (XANAX) 0.25 MG tablet Take 0.5-1 tablets (0.125-0.25 mg total) by mouth at bedtime as needed for sleep. 30 tablet 0   atorvastatin (LIPITOR) 80 MG tablet TAKE ONE TABLET BY MOUTH EVERY DAY AT 6:00PM 90 tablet 3   calcitRIOL (ROCALTROL) 0.25 MCG capsule Take 0.25 mcg by mouth every Monday, Wednesday, and Friday.     carvedilol (COREG) 25 MG tablet Take 1 tablet (25 mg total) by mouth 2 (two) times daily. 60 tablet 3   Cholecalciferol 50 MCG (2000 UT) TABS Take 4,000 Units by mouth at bedtime.     clopidogrel (PLAVIX) 75 MG tablet TAKE ONE TABLET (75MG  TOTAL) BY MOUTH DAILY 90 tablet 3   Continuous Blood Gluc Receiver (FREESTYLE LIBRE 2 READER) DEVI 1 each by Does not apply route daily. 1 each 0   Continuous Glucose Sensor (FREESTYLE LIBRE 2 SENSOR) MISC USE ONE FOR FOURTEEN DAYS 6 each 3   ELIQUIS 5 MG TABS tablet TAKE ONE TABLET BY MOUTH TWICE DAILY 60 tablet 5   furosemide (LASIX) 40 MG tablet Take 40 mg by mouth daily as needed for fluid or edema.     glucose blood (FREESTYLE PRECISION NEO TEST) test strip Use as instructed 100 each 3   Insulin NPH, Human,, Isophane, (NOVOLIN N FLEXPEN) 100 UNIT/ML Kiwkpen Inject 15  units subcutaneously in the morning & inject 4 units subcutaneously at night.     levothyroxine (SYNTHROID) 112 MCG tablet TAKE ONE TABLET BY MOUTH ONCE DAILY 90 tablet 2   nitroGLYCERIN (NITROSTAT) 0.4 MG SL tablet Place 1 tablet (0.4 mg total) under the tongue every 5 (five) minutes x 3 doses as needed for chest pain (If not relief after 3rd dose, call 911 or go to ED). 25 tablet 0   rOPINIRole (REQUIP) 0.5 MG tablet Take 1 tablet (0.5 mg total) by mouth at bedtime. 30 tablet 5   Semaglutide, 1 MG/DOSE, (OZEMPIC, 1 MG/DOSE,) 4 MG/3ML SOPN Inject 1 mg into the skin once a week. 3 mL 2   amiodarone (PACERONE) 200 MG tablet TAKE ONE TABLET BY MOUTH ONCE DAILY (Patient not taking:  Reported on 03/23/2023) 30 tablet 6   No current facility-administered medications on file prior to visit.    Allergies  Allergen Reactions   Penicillins Hives    Has patient had a PCN reaction causing immediate rash, facial/tongue/throat swelling, SOB or lightheadedness with hypotension: Yes Has patient had a PCN reaction causing severe rash involving mucus membranes or skin necrosis: No Has patient had a PCN reaction that required hospitalization No Has patient had a PCN reaction occurring within the last 10 years: No If all of the above answers are "NO", then may proceed with Cephalosporin use.   Percocet [Oxycodone-Acetaminophen] Nausea And Vomiting     DIAGNOSTIC DATA (LABS, IMAGING, TESTING) - I reviewed patient records, labs, notes, testing and imaging myself where available.  Lab Results  Component Value Date   WBC 7.8 12/03/2022   HGB 13.2 12/03/2022   HCT 41.0 12/03/2022   MCV 91 12/03/2022   PLT 279 12/03/2022      Component Value Date/Time   NA 142 12/03/2022 0943   K 3.8 12/03/2022 0943   CL 104 12/03/2022 0943   CO2 23 12/03/2022 0943   GLUCOSE 99 12/03/2022 0943   GLUCOSE 224 (H) 08/16/2022 1208   BUN 16 12/03/2022 0943   CREATININE 1.44 (H) 12/03/2022 0943   CREATININE 1.11 (H)  12/31/2017 0850   CALCIUM 9.2 12/03/2022 0943   PROT 6.0 09/25/2022 0848   ALBUMIN 4.1 09/25/2022 0848   AST 32 09/25/2022 0848   ALT 76 (H) 09/25/2022 0848   ALKPHOS 73 09/25/2022 0848   BILITOT 0.6 09/25/2022 0848   GFRNONAA 27 (L) 08/16/2022 1208   GFRNONAA 43 (L) 08/05/2017 0807   GFRAA 39 (L) 08/26/2019 0901   GFRAA 50 (L) 08/05/2017 0807   Lab Results  Component Value Date   CHOL 96 05/10/2022   HDL 30 (L) 05/10/2022   LDLCALC 35 05/10/2022   TRIG 156 (H) 05/10/2022   CHOLHDL 3.2 05/10/2022   Lab Results  Component Value Date   HGBA1C 6.4 (A) 02/01/2023   Lab Results  Component Value Date   VITAMINB12 194 (L) 03/02/2012   Lab Results  Component Value Date   TSH 2.100 09/25/2022    PHYSICAL EXAM:  Today's Vitals   03/23/23 1034 03/23/23 1054  BP: (!) 194/78 (!) 192/75  Pulse: 64 65  Weight: 118 lb (53.5 kg)   Height: 5\' 3"  (1.6 m)    Body mass index is 20.9 kg/m.   Wt Readings from Last 3 Encounters:  03/23/23 118 lb (53.5 kg)  03/15/23 124 lb (56.2 kg)  02/01/23 120 lb 12.8 oz (54.8 kg)     Ht Readings from Last 3 Encounters:  03/23/23 5\' 3"  (1.6 m)  03/15/23 5\' 3"  (1.6 m)  02/01/23 5\' 3"  (1.6 m)      General: The patient is awake, alert and appears not in acute distress. The patient is well groomed. Head: Normocephalic, atraumatic. Neck is supple. Dental status:  Cardiovascular:  Regular rate and cardiac rhythm by pulse,  without distended neck veins. Respiratory: Lungs are clear to auscultation.  Skin:  Without evidence of ankle edema, or rash. Trunk: The patient's posture is erect.   NEUROLOGIC EXAM: The patient is awake and alert, oriented to place and time.   Memory subjective described as intact.  Attention span & concentration ability appears normal.  Speech is fluent,  without  dysarthria, dysphonia or aphasia.  Mood and affect are appropriate.   Cranial nerves: no loss of smell or  taste reported  Pupils are equal and briskly  reactive to light. Funduscopic exam :"Lazy eye left "- just a sliver of peripheral vision after retinal vein occlusion. Eye diverts out and up-  Extraocular movements in vertical and horizontal planes were intact and without nystagmus. No Diplopia. Compensatory head movements.  Visual fields by finger perimetry only on the right intact.  Hearing was intact to soft voice and finger rubbing.    Facial sensation intact to fine touch.  Facial motor strength is symmetric and tongue and uvula move midline. No tongue tremor.  Neck ROM : rotation, tilt and flexion extension were normal for age and shoulder shrug was symmetrical.    Motor exam:  Symmetric bulk, tone and ROM.   Normal tone without cog-wheeling, symmetric grip strength .   Sensory:  Fine touch, pinprick and vibration were tested  and  absent below the knee.  Pinprick, fine touch and vibration were all impaired in both feet and temperature was barely felt , too.  Proprioception tested in the upper extremities was normal.   Coordination: Rapid alternating movements in the fingers/hands were of reduced  speed.  The Finger-to-nose maneuver was showing  high amplitude tremor, satellite movements and was worse on the left, non-dominant hand. Left sided evidence of ataxia, dysmetria / tremor.   Gait and station: Patient could rise unassisted from a seated position, walked without assistive device.  Stance is of  wider width/ base and the patient turned with 4 steps.  She was swaying to the right- she was unable to perform a tandem walk, ataxia.  Toe and heel walk were impaired  Deep tendon reflexes: in the  upper and lower extremities are symmetric and intact.  Babinski response was deferred /    ASSESSMENT AND PLAN 77 y.o. year old female  here with:    1) while I still attribute her  balance problem to neuropathy , partially diabetic and partially from amiodarone,  there is tremor and ataxia that has worsened over the last 12 months,  and this can be attributed only partially to amiodarone.   The tremor is essential, not affecting the dominant hand.   More on the left, mild resting and stronger with action.   I like to get a brain/ cerebellar image in this Pacemaker patient.  CT will not be enough, she has an MRI compatible device.  I will order an MRI brain and cervical spine. Non contrast .   2) I will order NCV and EMG. Lower extremities.   3) neuropathy panel ordered.   I plan to follow up either personally or through our NP within 4 months.   I would like to thank Assunta Found, MD and Johnn Hai, Np [address] [place],  [place] [address] for allowing me to meet with and to take care of this pleasant patient.    After spending a total time of  45  minutes face to face and additional time for physical and neurologic examination, review of laboratory studies,  personal review of imaging studies, reports and results of other testing and review of referral information / records as far as provided in visit,   Electronically signed by: Melvyn Novas, MD 03/23/2023 10:55 AM  [facility] Board certified by The [employer] of Sleep Medicine and Diplomate of the [employer] of Sleep Medicine. Board certified In Neurology through the ABPN, Fellow of the [employer] of [employer].

## 2023-03-25 ENCOUNTER — Telehealth: Payer: Self-pay | Admitting: Neurology

## 2023-03-25 NOTE — Telephone Encounter (Signed)
[employer] adv NPR sent to [employer] [phone]

## 2023-03-26 ENCOUNTER — Encounter: Payer: Self-pay | Admitting: Internal Medicine

## 2023-03-26 LAB — ANA W/REFLEX: ANA Titer 1: NEGATIVE

## 2023-03-30 ENCOUNTER — Telehealth: Payer: Self-pay | Admitting: Anesthesiology

## 2023-03-30 ENCOUNTER — Other Ambulatory Visit (HOSPITAL_COMMUNITY): Payer: PPO

## 2023-03-30 DIAGNOSIS — G4733 Obstructive sleep apnea (adult) (pediatric): Secondary | ICD-10-CM | POA: Diagnosis not present

## 2023-03-30 NOTE — Telephone Encounter (Signed)
-----   Message from [place] [facility] Dohmeier sent at 03/29/2023  4:41 PM EDT ----- Renal function has improved in comparison to 3 months ago , creatinine is decreasing.!!! GOOD    Normal thyroid stimulation hormone and B 12 level. We are still waiting for protein e phoresis data.

## 2023-04-02 LAB — CBC WITH DIFFERENTIAL/PLATELET
Basophils Absolute: 0.1 10*3/uL (ref 0.0–0.2)
Basos: 1 %
EOS (ABSOLUTE): 0.2 10*3/uL (ref 0.0–0.4)
Eos: 2 %
Hematocrit: 42.1 % (ref 34.0–46.6)
Hemoglobin: 13.2 g/dL (ref 11.1–15.9)
Immature Grans (Abs): 0 10*3/uL (ref 0.0–0.1)
Immature Granulocytes: 0 %
Lymphocytes Absolute: 0.8 10*3/uL (ref 0.7–3.1)
Lymphs: 9 %
MCH: 29.7 pg (ref 26.6–33.0)
MCHC: 31.4 g/dL — ABNORMAL LOW (ref 31.5–35.7)
MCV: 95 fL (ref 79–97)
Monocytes Absolute: 0.5 10*3/uL (ref 0.1–0.9)
Monocytes: 6 %
Neutrophils Absolute: 7.2 10*3/uL — ABNORMAL HIGH (ref 1.4–7.0)
Neutrophils: 82 %
Platelets: 263 10*3/uL (ref 150–450)
RBC: 4.44 x10E6/uL (ref 3.77–5.28)
RDW: 15.3 % (ref 11.7–15.4)
WBC: 8.7 10*3/uL (ref 3.4–10.8)

## 2023-04-02 LAB — COMPREHENSIVE METABOLIC PANEL
ALT: 22 [IU]/L (ref 0–32)
AST: 22 [IU]/L (ref 0–40)
Albumin: 4.1 g/dL (ref 3.8–4.8)
Alkaline Phosphatase: 89 [IU]/L (ref 44–121)
BUN/Creatinine Ratio: 15 (ref 12–28)
BUN: 19 mg/dL (ref 8–27)
Bilirubin Total: 0.6 mg/dL (ref 0.0–1.2)
CO2: 27 mmol/L (ref 20–29)
Calcium: 10 mg/dL (ref 8.7–10.3)
Chloride: 102 mmol/L (ref 96–106)
Creatinine, Ser: 1.25 mg/dL — ABNORMAL HIGH (ref 0.57–1.00)
Globulin, Total: 2 g/dL (ref 1.5–4.5)
Glucose: 208 mg/dL — ABNORMAL HIGH (ref 70–99)
Potassium: 3.9 mmol/L (ref 3.5–5.2)
Sodium: 143 mmol/L (ref 134–144)
Total Protein: 6.1 g/dL (ref 6.0–8.5)
eGFR: 44 mL/min/{1.73_m2} — ABNORMAL LOW (ref >59)

## 2023-04-02 LAB — MULTIPLE MYELOMA PANEL, SERUM
Albumin SerPl Elph-Mcnc: 3.5 g/dL (ref 2.9–4.4)
Albumin/Glob SerPl: 1.4 (ref 0.7–1.7)
Alpha 1: 0.3 g/dL (ref 0.0–0.4)
Alpha2 Glob SerPl Elph-Mcnc: 0.8 g/dL (ref 0.4–1.0)
B-Globulin SerPl Elph-Mcnc: 0.9 g/dL (ref 0.7–1.3)
Gamma Glob SerPl Elph-Mcnc: 0.6 g/dL (ref 0.4–1.8)
Globulin, Total: 2.6 g/dL (ref 2.2–3.9)
IgA/Immunoglobulin A, Serum: 163 mg/dL (ref 64–422)
IgG (Immunoglobin G), Serum: 560 mg/dL — ABNORMAL LOW (ref 586–1602)
IgM (Immunoglobulin M), Srm: 69 mg/dL (ref 26–217)

## 2023-04-02 LAB — C-REACTIVE PROTEIN: CRP: <1 mg/L (ref 0–10)

## 2023-04-02 LAB — SEDIMENTATION RATE: Sed Rate: 2 mm/h (ref 0–40)

## 2023-04-02 LAB — VITAMIN B12: Vitamin B-12: 401 pg/mL (ref 232–1245)

## 2023-04-02 LAB — TSH: TSH: 1.17 u[IU]/mL (ref 0.450–4.500)

## 2023-04-02 LAB — HOMOCYSTEINE: Homocysteine: 13 umol/L (ref 0.0–19.2)

## 2023-04-02 LAB — SJOGREN'S SYNDROME ANTIBODS(SSA + SSB)
ENA SSA (RO) Ab: <0.2 AI (ref 0.0–0.9)
ENA SSB (LA) Ab: <0.2 AI (ref 0.0–0.9)

## 2023-04-02 LAB — VITAMIN B1: Thiamine: 143.3 nmol/L (ref 66.5–200.0)

## 2023-04-02 LAB — VITAMIN B6: Vitamin B6: 3 ug/L — ABNORMAL LOW (ref 3.4–65.2)

## 2023-04-05 ENCOUNTER — Telehealth: Payer: Self-pay | Admitting: Anesthesiology

## 2023-04-05 NOTE — Telephone Encounter (Signed)
-----   Message from [place] Dohmeier sent at 04/02/2023  3:40 PM EDT ----- Serum E phoresis was essentially normal, IGG level is slightly reduced at 560 mg/dl.  Most important: no monoclonal or oligoclonal spike. Please take a daily vit B complex over the counter, no need for prescription vitamins.

## 2023-04-06 ENCOUNTER — Other Ambulatory Visit (HOSPITAL_COMMUNITY): Payer: Self-pay

## 2023-04-07 ENCOUNTER — Ambulatory Visit: Payer: HMO

## 2023-04-08 ENCOUNTER — Ambulatory Visit (INDEPENDENT_AMBULATORY_CARE_PROVIDER_SITE_OTHER): Payer: PPO

## 2023-04-08 DIAGNOSIS — I495 Sick sinus syndrome: Secondary | ICD-10-CM | POA: Diagnosis not present

## 2023-04-08 LAB — CUP PACEART REMOTE DEVICE CHECK
Battery Remaining Longevity: 42 mo
Battery Voltage: 2.97 V
Brady Statistic AP VP Percent: 1.21 %
Brady Statistic AP VS Percent: 33.57 %
Brady Statistic AS VP Percent: 1.39 %
Brady Statistic AS VS Percent: 63.83 %
Brady Statistic RA Percent Paced: 33.5 %
Brady Statistic RV Percent Paced: 2.46 %
Date Time Interrogation Session: 20241017075136
Implantable Lead Connection Status: 753985
Implantable Lead Connection Status: 753985
Implantable Lead Implant Date: 20180104
Implantable Lead Implant Date: 20180104
Implantable Lead Location: 753859
Implantable Lead Location: 753860
Implantable Lead Model: 5076
Implantable Lead Model: 5076
Implantable Pulse Generator Implant Date: 20180104
Lead Channel Impedance Value: 323 Ohm
Lead Channel Impedance Value: 342 Ohm
Lead Channel Impedance Value: 380 Ohm
Lead Channel Impedance Value: 399 Ohm
Lead Channel Pacing Threshold Amplitude: 0.625 V
Lead Channel Pacing Threshold Amplitude: 1.125 V
Lead Channel Pacing Threshold Pulse Width: 0.4 ms
Lead Channel Pacing Threshold Pulse Width: 0.4 ms
Lead Channel Sensing Intrinsic Amplitude: 2.375 mV
Lead Channel Sensing Intrinsic Amplitude: 2.375 mV
Lead Channel Sensing Intrinsic Amplitude: 21.5 mV
Lead Channel Sensing Intrinsic Amplitude: 21.5 mV
Lead Channel Setting Pacing Amplitude: 2 V
Lead Channel Setting Pacing Amplitude: 2.75 V
Lead Channel Setting Pacing Pulse Width: 0.4 ms
Lead Channel Setting Sensing Sensitivity: 2.8 mV
Zone Setting Status: 755011

## 2023-04-12 ENCOUNTER — Other Ambulatory Visit: Payer: Self-pay

## 2023-04-12 DIAGNOSIS — N1832 Chronic kidney disease, stage 3b: Secondary | ICD-10-CM | POA: Insufficient documentation

## 2023-04-12 DIAGNOSIS — I130 Hypertensive heart and chronic kidney disease with heart failure and stage 1 through stage 4 chronic kidney disease, or unspecified chronic kidney disease: Secondary | ICD-10-CM | POA: Diagnosis not present

## 2023-04-12 DIAGNOSIS — Z950 Presence of cardiac pacemaker: Secondary | ICD-10-CM | POA: Insufficient documentation

## 2023-04-12 DIAGNOSIS — R0789 Other chest pain: Principal | ICD-10-CM | POA: Insufficient documentation

## 2023-04-12 DIAGNOSIS — I2510 Atherosclerotic heart disease of native coronary artery without angina pectoris: Secondary | ICD-10-CM | POA: Diagnosis not present

## 2023-04-12 DIAGNOSIS — N390 Urinary tract infection, site not specified: Secondary | ICD-10-CM | POA: Insufficient documentation

## 2023-04-12 DIAGNOSIS — E876 Hypokalemia: Secondary | ICD-10-CM | POA: Insufficient documentation

## 2023-04-12 DIAGNOSIS — Z8673 Personal history of transient ischemic attack (TIA), and cerebral infarction without residual deficits: Secondary | ICD-10-CM | POA: Insufficient documentation

## 2023-04-12 DIAGNOSIS — R918 Other nonspecific abnormal finding of lung field: Secondary | ICD-10-CM | POA: Diagnosis not present

## 2023-04-12 DIAGNOSIS — I5021 Acute systolic (congestive) heart failure: Secondary | ICD-10-CM | POA: Diagnosis not present

## 2023-04-12 DIAGNOSIS — E1122 Type 2 diabetes mellitus with diabetic chronic kidney disease: Secondary | ICD-10-CM | POA: Diagnosis not present

## 2023-04-12 DIAGNOSIS — I480 Paroxysmal atrial fibrillation: Secondary | ICD-10-CM | POA: Insufficient documentation

## 2023-04-12 DIAGNOSIS — I4581 Long QT syndrome: Secondary | ICD-10-CM | POA: Diagnosis not present

## 2023-04-12 DIAGNOSIS — E039 Hypothyroidism, unspecified: Secondary | ICD-10-CM | POA: Insufficient documentation

## 2023-04-12 NOTE — ED Triage Notes (Signed)
Pt to ed c/o CP that started around 930pm that pt states has "let up" since arriving to the ED. Pt states central chest pressure that radiated to left arm a couple of time. Pt has had hematuria since yesterday morning. Pt had a UA done this morning at [facility]. EKG done in triage. Pt not in any distress at this time

## 2023-04-13 ENCOUNTER — Other Ambulatory Visit: Payer: Self-pay

## 2023-04-13 ENCOUNTER — Observation Stay (HOSPITAL_COMMUNITY)
Admission: EM | Admit: 2023-04-13 | Discharge: 2023-04-14 | Disposition: A | Discharge status: Home / Self Care | Payer: PPO | Attending: Family Medicine | Admitting: Family Medicine

## 2023-04-13 ENCOUNTER — Encounter (HOSPITAL_COMMUNITY): Payer: Self-pay | Admitting: Emergency Medicine

## 2023-04-13 ENCOUNTER — Emergency Department (HOSPITAL_COMMUNITY): Payer: PPO

## 2023-04-13 DIAGNOSIS — E039 Hypothyroidism, unspecified: Secondary | ICD-10-CM | POA: Diagnosis not present

## 2023-04-13 DIAGNOSIS — R079 Chest pain, unspecified: Principal | ICD-10-CM | POA: Diagnosis present

## 2023-04-13 DIAGNOSIS — I2510 Atherosclerotic heart disease of native coronary artery without angina pectoris: Secondary | ICD-10-CM | POA: Diagnosis not present

## 2023-04-13 DIAGNOSIS — I480 Paroxysmal atrial fibrillation: Secondary | ICD-10-CM | POA: Diagnosis not present

## 2023-04-13 DIAGNOSIS — I10 Essential (primary) hypertension: Secondary | ICD-10-CM | POA: Diagnosis not present

## 2023-04-13 DIAGNOSIS — E876 Hypokalemia: Secondary | ICD-10-CM | POA: Diagnosis present

## 2023-04-13 DIAGNOSIS — E1122 Type 2 diabetes mellitus with diabetic chronic kidney disease: Secondary | ICD-10-CM | POA: Diagnosis not present

## 2023-04-13 DIAGNOSIS — N1832 Chronic kidney disease, stage 3b: Secondary | ICD-10-CM | POA: Diagnosis not present

## 2023-04-13 DIAGNOSIS — I5021 Acute systolic (congestive) heart failure: Secondary | ICD-10-CM | POA: Diagnosis not present

## 2023-04-13 DIAGNOSIS — K219 Gastro-esophageal reflux disease without esophagitis: Secondary | ICD-10-CM | POA: Diagnosis present

## 2023-04-13 DIAGNOSIS — R0789 Other chest pain: Secondary | ICD-10-CM | POA: Diagnosis not present

## 2023-04-13 DIAGNOSIS — Z950 Presence of cardiac pacemaker: Secondary | ICD-10-CM | POA: Diagnosis not present

## 2023-04-13 DIAGNOSIS — N390 Urinary tract infection, site not specified: Secondary | ICD-10-CM | POA: Diagnosis not present

## 2023-04-13 DIAGNOSIS — I5020 Unspecified systolic (congestive) heart failure: Secondary | ICD-10-CM | POA: Diagnosis present

## 2023-04-13 DIAGNOSIS — Z8673 Personal history of transient ischemic attack (TIA), and cerebral infarction without residual deficits: Secondary | ICD-10-CM | POA: Diagnosis not present

## 2023-04-13 DIAGNOSIS — I130 Hypertensive heart and chronic kidney disease with heart failure and stage 1 through stage 4 chronic kidney disease, or unspecified chronic kidney disease: Secondary | ICD-10-CM | POA: Diagnosis not present

## 2023-04-13 DIAGNOSIS — R918 Other nonspecific abnormal finding of lung field: Secondary | ICD-10-CM | POA: Diagnosis not present

## 2023-04-13 LAB — CBC
HCT: 37 % (ref 36.0–46.0)
Hemoglobin: 12 g/dL (ref 12.0–15.0)
MCH: 30.7 pg (ref 26.0–34.0)
MCHC: 32.4 g/dL (ref 30.0–36.0)
MCV: 94.6 fL (ref 80.0–100.0)
Platelets: 200 10*3/uL (ref 150–400)
RBC: 3.91 MIL/uL (ref 3.87–5.11)
RDW: 15.9 % — ABNORMAL HIGH (ref 11.5–15.5)
WBC: 8.9 10*3/uL (ref 4.0–10.5)
nRBC: 0 % (ref 0.0–0.2)

## 2023-04-13 LAB — BASIC METABOLIC PANEL
Anion gap: 6 (ref 5–15)
BUN: 20 mg/dL (ref 8–23)
CO2: 26 mmol/L (ref 22–32)
Calcium: 8.6 mg/dL — ABNORMAL LOW (ref 8.9–10.3)
Chloride: 104 mmol/L (ref 98–111)
Creatinine, Ser: 1.24 mg/dL — ABNORMAL HIGH (ref 0.44–1.00)
GFR, Estimated: 45 mL/min — ABNORMAL LOW (ref >60)
Glucose, Bld: 175 mg/dL — ABNORMAL HIGH (ref 70–99)
Potassium: 3.4 mmol/L — ABNORMAL LOW (ref 3.5–5.1)
Sodium: 136 mmol/L (ref 135–145)

## 2023-04-13 LAB — MAGNESIUM: Magnesium: 1.9 mg/dL (ref 1.7–2.4)

## 2023-04-13 LAB — URINALYSIS, ROUTINE W REFLEX MICROSCOPIC
Bilirubin Urine: NEGATIVE
Glucose, UA: NEGATIVE mg/dL
Ketones, ur: NEGATIVE mg/dL
Nitrite: NEGATIVE
Protein, ur: 100 mg/dL — AB
RBC / HPF: >50 RBC/hpf (ref 0–5)
Specific Gravity, Urine: 1.013 (ref 1.005–1.030)
WBC, UA: >50 WBC/hpf (ref 0–5)
pH: 6 (ref 5.0–8.0)

## 2023-04-13 LAB — TROPONIN I (HIGH SENSITIVITY)
Troponin I (High Sensitivity): 25 ng/L — ABNORMAL HIGH (ref <18)
Troponin I (High Sensitivity): 31 ng/L — ABNORMAL HIGH (ref <18)
Troponin I (High Sensitivity): 30 ng/L — ABNORMAL HIGH (ref <18)

## 2023-04-13 LAB — CBG MONITORING, ED: Glucose-Capillary: 142 mg/dL — ABNORMAL HIGH (ref 70–99)

## 2023-04-13 MED ORDER — SODIUM CHLORIDE 0.9 % IV SOLN
1.0000 g | Freq: Once | INTRAVENOUS | Status: AC
  Administered 2023-04-13: 1 g via INTRAVENOUS
  Filled 2023-04-13: qty 10

## 2023-04-13 MED ORDER — POTASSIUM CHLORIDE CRYS ER 20 MEQ PO TBCR
40.0000 meq | EXTENDED_RELEASE_TABLET | Freq: Once | ORAL | Status: AC
  Administered 2023-04-13: 40 meq via ORAL
  Filled 2023-04-13: qty 2

## 2023-04-13 MED ORDER — LEVOTHYROXINE SODIUM 112 MCG PO TABS
112.0000 ug | ORAL_TABLET | Freq: Every day | ORAL | Status: DC
  Administered 2023-04-14: 112 ug via ORAL
  Filled 2023-04-13: qty 1

## 2023-04-13 MED ORDER — CARVEDILOL 12.5 MG PO TABS
25.0000 mg | ORAL_TABLET | Freq: Two times a day (BID) | ORAL | Status: DC
  Administered 2023-04-13 – 2023-04-14 (×2): 25 mg via ORAL
  Filled 2023-04-13 (×2): qty 2

## 2023-04-13 MED ORDER — VITAMIN D 25 MCG (1000 UNIT) PO TABS
4000.0000 [IU] | ORAL_TABLET | Freq: Every day | ORAL | Status: DC
  Administered 2023-04-13: 4000 [IU] via ORAL
  Filled 2023-04-13: qty 4

## 2023-04-13 MED ORDER — NITROGLYCERIN 0.4 MG SL SUBL: 0.4000 mg | SUBLINGUAL_TABLET | SUBLINGUAL | Status: DC | PRN

## 2023-04-13 MED ORDER — ROPINIROLE HCL 0.25 MG PO TABS
0.5000 mg | ORAL_TABLET | Freq: Every day | ORAL | Status: DC
  Administered 2023-04-13: 0.5 mg via ORAL
  Filled 2023-04-13: qty 2

## 2023-04-13 MED ORDER — ASPIRIN 81 MG PO CHEW
324.0000 mg | CHEWABLE_TABLET | Freq: Once | ORAL | Status: AC
  Administered 2023-04-13: 324 mg via ORAL
  Filled 2023-04-13: qty 4

## 2023-04-13 MED ORDER — APIXABAN 5 MG PO TABS
5.0000 mg | ORAL_TABLET | Freq: Two times a day (BID) | ORAL | Status: DC
  Administered 2023-04-13 – 2023-04-14 (×2): 5 mg via ORAL
  Filled 2023-04-13 (×2): qty 1

## 2023-04-13 MED ORDER — CLOPIDOGREL BISULFATE 75 MG PO TABS
75.0000 mg | ORAL_TABLET | Freq: Every day | ORAL | Status: DC
  Administered 2023-04-13 – 2023-04-14 (×2): 75 mg via ORAL
  Filled 2023-04-13 (×2): qty 1

## 2023-04-13 MED ORDER — ALPRAZOLAM 0.25 MG PO TABS
0.1250 mg | ORAL_TABLET | Freq: Every evening | ORAL | Status: DC | PRN
  Filled 2023-04-13: qty 1

## 2023-04-13 MED ORDER — HYDRALAZINE HCL 25 MG PO TABS
25.0000 mg | ORAL_TABLET | Freq: Three times a day (TID) | ORAL | Status: DC | PRN
  Administered 2023-04-14: 25 mg via ORAL
  Filled 2023-04-13: qty 1

## 2023-04-13 MED ORDER — MAGNESIUM SULFATE 2 GM/50ML IV SOLN
2.0000 g | Freq: Once | INTRAVENOUS | Status: AC
  Administered 2023-04-13: 2 g via INTRAVENOUS
  Filled 2023-04-13: qty 50

## 2023-04-13 MED ORDER — SODIUM CHLORIDE 0.9 % IV SOLN
1.0000 g | INTRAVENOUS | Status: DC
  Administered 2023-04-14: 1 g via INTRAVENOUS
  Filled 2023-04-13: qty 10

## 2023-04-13 MED ORDER — FUROSEMIDE 40 MG PO TABS: 40.0000 mg | ORAL_TABLET | Freq: Every day | ORAL | Status: DC | PRN

## 2023-04-13 NOTE — H&P (Addendum)
History and Physical    Patient: Virginia Huber ZOX:[id number] DOB: May 04, 1946 DOA: 04/13/2023 DOS: the patient was seen and examined on 04/13/2023 PCP: Assunta Found, MD  Patient coming from: Home  Chief Complaint:  Chief Complaint  Patient presents with   Chest Pain   HPI: Virginia Huber is a 77 y.o. female with medical history significant of Atrial fibrillation , HFpEF , stage 3b CKD, CAD s/p PCI, hypertension, GERD, hypothyroidism, OSA, type 2 DM, PAD, s/p PPM, came in for intermittent chest pain, at rest , not associated with any exertion or food intake. Se denies fever or chills, nausea, vomiting or abdominal pain.  She denies cough, sob, syncope, dizziness, headache, .  Currently she is chest pain free.  EKG shows sinus rhythm with prolonged Qtc.   Troponin are 25, 31, 30.  Labs are significant for potassium of 3.4, creatinine of 1.24.  US shows large leukocytes, rare bacteria.  CXR shows Small bilateral pleural effusions with atelectasis or infiltrate.   She was referred to [facility] for evaluation of chest pain.  Cardiology consulted for recommendations.   Review of Systems: As mentioned in the history of present illness. All other systems reviewed and are negative. 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 h/o PAF/flutter during admission in 2013 for PNA. b. PAF during adm for NSTEMI 07/2015, subsequent paroxysms since then.   B12 deficiency anemia    Branch retinal vein occlusion with macular edema of right eye 10/11/2019   Chronic renal failure, stage 3b (HCC) 09/23/2017   Coronary artery disease 11/30/2014   a. remote MI. b. h/o PTCA with scoring balloon to OM1  11/2014. c. NSTEMI 03/2015 s/p DES to prox-mid Cx. d. NSTEMI 07/2015 s/p scoring balloon/PTCA/DES to dRCA with PAF during that admission   Cutaneous lupus erythematosus    Early stage nonexudative age-related macular degeneration of right eye 02/27/2020   Essential hypertension    GERD (gastroesophageal reflux disease)    History of blood transfusion 1980's   2nd surgical procedures   Hypercholesteremia    Hypothyroidism    Myocardial infarction (HCC) 02/2012   NSTEMI (non-ST elevated myocardial infarction) (HCC) 04/02/2015   OSA (obstructive sleep apnea) 05/13/2016   Ovarian tumor    PAD (peripheral artery disease) (HCC)    a. s/p LE angio 2015; followed by Dr. Kirke Corin - managed medically.   Pericardial effusion    a. 06/2016 after ppm - s/p pericardiocentesis.   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 s/p [employer] PPM 06/2016, c/b lead perf/pericardial effusion.   TIA (transient ischemic attack)    Type 2 diabetes with nephropathy (HCC) 02/29/2012   Past Surgical History:  Procedure Laterality Date   A-FLUTTER ABLATION N/A 12/08/2022   Procedure: A-FLUTTER ABLATION;  Surgeon: Regan Lemming, MD;  Location: [facility] INVAS[facility];  Service: Cardiovascular;  Laterality:  N/A;   ABDOMINAL AORTAGRAM N/A 01/03/2014   Procedure: ABDOMINAL Ronny Flurry;  Surgeon: Iran Ouch, MD;  Location: [facility] CATH LAB;  Service: Cardiovascular;  Laterality: N/A;   ABDOMINAL HYSTERECTOMY  06/22/1970   "partial"   APPENDECTOMY  02/20/1969   ATRIAL FIBRILLATION ABLATION N/A 08/14/2022   Procedure: ATRIAL FIBRILLATION ABLATION;  Surgeon: Regan Lemming, MD;  Location: [facility];  Service: Cardiovascular;  Laterality: N/A;   ATRIAL FIBRILLATION ABLATION N/A 12/08/2022   Procedure: ATRIAL FIBRILLATION ABLATION;  Surgeon: Regan Lemming, MD;  Location: [facility];  Service: Cardiovascular;  Laterality: N/A;   CARDIAC CATHETERIZATION  06/22/2006   Tiny OM-2 with 90% narrowing. Med tx.   CARDIAC CATHETERIZATION N/A 11/30/2014   Procedure: Left Heart Cath and Coronary Angiography;  Surgeon: Lennette Bihari, MD; LAD 20%, CFX 50%, OM1 95%, right PLB 30%, LV normal    CARDIAC CATHETERIZATION N/A 11/30/2014   Procedure: Coronary Balloon Angioplasty;  Surgeon: Lennette Bihari, MD;  Angiosculpt scoring balloon and PTCA to the OM1 reducing stenosis from 95% to less than 10%   CARDIAC CATHETERIZATION N/A 04/03/2015   Procedure: Left Heart Cath and Coronary Angiography;  Surgeon: Dolores Patty, MD; dLAD 50%, CFX 90%, OM1 100%, PLA 15%, LVEDP 13     CARDIAC CATHETERIZATION N/A 04/03/2015   Procedure: Coronary Stent Intervention;  Surgeon: Tonny Bollman, MD; 3.0x18 mm Xience DES to the CFX     CARDIAC CATHETERIZATION N/A 08/02/2015   Procedure: Left Heart Cath and Coronary Angiography;  Surgeon: Lennette Bihari, MD;  Location: [facility] INVASIVE CV LAB;  Service: Cardiovascular;  Laterality: N/A;   CARDIAC CATHETERIZATION N/A 08/02/2015   Procedure: Coronary Stent Intervention;  Surgeon: Lennette Bihari, MD;  [facility];  Service: Cardiovascular;  Laterality: N/A;   CARDIAC CATHETERIZATION N/A 06/25/2016   Procedure: Pericardiocentesis;  Surgeon: Will Jorja Loa, MD;  Location: [facility];  Service: Cardiovascular;  Laterality: N/A;   cardiac stents     CARDIOVERSION N/A 12/15/2017   Procedure: CARDIOVERSION;  Surgeon: Elease Hashimoto Deloris Ping, MD;  Location: [facility] ENDOSCOPY;  Service: Cardiovascular;  Laterality: N/A;   CARDIOVERSION N/A 05/12/2022   Procedure: CARDIOVERSION;  Surgeon: Quintella Reichert, MD;   Location: [facility] ENDOSCOPY;  Service: Cardiovascular;  Laterality: N/A;   CARDIOVERSION N/A 09/30/2022   Procedure: CARDIOVERSION;  Surgeon: Thurmon Fair, MD;  Location: [facility];  Service: Cardiovascular;  Laterality: N/A;   CARDIOVERSION N/A 10/29/2022   Procedure: CARDIOVERSION;  Surgeon: Quintella Reichert, MD;  [facility]: [facility];  Service: Cardiovascular;  Laterality: N/A;   CHOLECYSTECTOMY OPEN  02/20/1989   COLONOSCOPY  06/23/2003   Dr. Karilyn Cota: pancolonic divericula, polyp, path unknown currently   COLONOSCOPY  06/22/2010   Dr. Darrick Penna: Normal TI, scattered diverticula in entire colon, small internal hemorrhoids, normal colon biopsies. Colonoscopy in 5-10 years.    COLONOSCOPY WITH PROPOFOL N/A 06/02/2021   pancolonic diverticulosis. Two 4-6 mm polyps in transverse colon. Sessile serrated and hyperplastic. 5 year surveillance if benefits outweigh the risks.   COLOSTOMY  05/23/1979   COLOSTOMY REVERSAL  11/21/1979   EP IMPLANTABLE DEVICE N/A 06/25/2016   Procedure: Lead Revision/Repair;  Surgeon: Will Jorja Loa, MD;  [facility]: [facility];  Service: Cardiovascular;  Laterality: N/A;   EP IMPLANTABLE DEVICE N/A 06/25/2016   Procedure: Pacemaker Implant;  Surgeon: Will Jorja Loa, MD;  Location: [facility];  Service: Cardiovascular;  Laterality: N/A;  EXCISIONAL HEMORRHOIDECTOMY  02/20/1969   EYE SURGERY Left 06/22/1998   "branch vein occlusion"   EYE SURGERY Left ~ 2001   "smoothed out wrinkle"   LEFT HEART CATH AND CORONARY ANGIOGRAPHY N/A 05/11/2022   Procedure: LEFT HEART CATH AND CORONARY ANGIOGRAPHY;  Surgeon: Kathleene Hazel, MD;  Location: [facility];  Service: Cardiovascular;  Laterality: N/A;   LEFT OOPHORECTOMY  05/23/1979   nicked bowel, peritonitis, colostomy; colostomy reversed 1981    LOWER EXTREMITY ANGIOGRAM N/A 01/03/2014   Procedure: LOWER EXTREMITY ANGIOGRAM;  Surgeon: Iran Ouch, MD;  Location: [facility] CATH  LAB;  Service: Cardiovascular;  Laterality: N/A;   Nuclear med stress test  10/21/2011   Small area of mild ischemia inferoapically.   PARTIAL HYSTERECTOMY  02/20/1969   left ovaries, then ovaries removed later due tumors    POLYPECTOMY  06/02/2021   Procedure: POLYPECTOMY;  Surgeon: Lanelle Bal, DO;  Location: [facility] ENDO [facility];  Service: Endoscopy;;   right eye surgery  03/2021   RIGHT OOPHORECTOMY  02/20/1969   TEE WITHOUT CARDIOVERSION N/A 05/12/2022   Procedure: TRANSESOPHAGEAL ECHOCARDIOGRAM (TEE);  Surgeon: Quintella Reichert, MD;  Location: [facility] ENDOSCOPY;  Service: Cardiovascular;  Laterality: N/A;   TEE WITHOUT CARDIOVERSION N/A 08/14/2022   Procedure: TRANSESOPHAGEAL ECHOCARDIOGRAM;  Surgeon: Regan Lemming, MD;  Location: [facility] INVASIVE CV LAB;  Service: Cardiovascular;  Laterality: N/A;   TEE WITHOUT CARDIOVERSION N/A 12/08/2022   Procedure: TRANSESOPHAGEAL ECHOCARDIOGRAM;  Surgeon: Regan Lemming, MD;  Location: [facility] INVASIVE CV LAB;  Service: Cardiovascular;  Laterality: N/A;   Social History:  reports that she has never smoked. She has never been exposed to tobacco smoke. She has never used smokeless tobacco. She reports that she does not drink alcohol and does not use drugs.  Allergies  Allergen Reactions   Penicillins Hives    Has patient had a PCN reaction causing immediate rash, facial/tongue/throat swelling, SOB or lightheadedness with hypotension: Yes Has patient had a PCN reaction causing severe rash involving mucus membranes or skin necrosis: No Has patient had a PCN reaction that required hospitalization No Has patient had a PCN reaction occurring within the last 10 years: No If all of the above answers are "NO", then may proceed with Cephalosporin use.   Percocet [Oxycodone-Acetaminophen] Nausea And Vomiting    Family History  Problem Relation Age of Onset   Heart disease Mother        deceased   Heart disease Father        deceased, heart disease    Diabetes Brother    Heart disease Brother    Thyroid disease Brother    Heart disease Sister    Heart disease Brother    Thyroid disease Brother    Lupus Daughter    Colon cancer Neg Hx     Prior to Admission medications   Medication Sig Start Date End Date Taking? Authorizing Provider  acetaminophen (TYLENOL) 500 MG tablet Take 1,000 mg by mouth every 8 (eight) hours as needed for headache, moderate pain or mild pain.    [provider]  ALPRAZolam Prudy Feeler) 0.25 MG tablet Take 0.5-1 tablets (0.125-0.25 mg total) by mouth at bedtime as needed for sleep. 03/15/23   Dohmeier, Porfirio Mylar, MD  amiodarone (PACERONE) 200 MG tablet TAKE ONE TABLET BY MOUTH ONCE DAILY Patient not taking: Reported on 03/23/2023 12/28/22   Fenton, Clint R, PA  atorvastatin (LIPITOR) 80 MG tablet TAKE ONE TABLET BY MOUTH EVERY DAY AT 6:00PM 10/06/18  Laqueta Linden, MD  calcitRIOL (ROCALTROL) 0.25 MCG capsule Take 0.25 mcg by mouth every Monday, Wednesday, and Friday. 12/24/21   [provider]  carvedilol (COREG) 25 MG tablet Take 1 tablet (25 mg total) by mouth 2 (two) times daily. 12/28/22   Jonelle Sidle, MD  Cholecalciferol 50 MCG (2000 UT) TABS Take 4,000 Units by mouth at bedtime.    [provider]  clopidogrel (PLAVIX) 75 MG tablet TAKE ONE TABLET (75MG  TOTAL) BY MOUTH DAILY 03/20/19   Laqueta Linden, MD  Continuous Blood Gluc Receiver (FREESTYLE LIBRE 2 READER) DEVI 1 each by Does not apply route daily. 06/24/20   Carlus Pavlov, MD  Continuous Glucose Sensor (FREESTYLE LIBRE 2 SENSOR) MISC USE ONE FOR FOURTEEN DAYS 03/09/23   Carlus Pavlov, MD  ELIQUIS 5 MG TABS tablet TAKE ONE TABLET BY MOUTH TWICE DAILY 12/28/22   Fenton, Clint R, PA  furosemide (LASIX) 40 MG tablet Take 40 mg by mouth daily as needed for fluid or edema.    [provider]  glucose blood (FREESTYLE PRECISION NEO TEST) test strip Use as instructed 05/23/21   Carlus Pavlov, MD  Insulin NPH,  Human,, Isophane, (NOVOLIN N FLEXPEN) 100 UNIT/ML Kiwkpen Inject 15 units subcutaneously in the morning & inject 4 units subcutaneously at night. 02/01/23   Carlus Pavlov, MD  levothyroxine (SYNTHROID) 112 MCG tablet TAKE ONE TABLET BY MOUTH ONCE DAILY 07/10/22   Carlus Pavlov, MD  nitroGLYCERIN (NITROSTAT) 0.4 MG SL tablet Place 1 tablet (0.4 mg total) under the tongue every 5 (five) minutes x 3 doses as needed for chest pain (If not relief after 3rd dose, call 911 or go to ED). 05/18/22   Sharlene Dory, NP  rOPINIRole (REQUIP) 0.5 MG tablet Take 1 tablet (0.5 mg total) by mouth at bedtime. 11/03/22   Dohmeier, Porfirio Mylar, MD  Semaglutide, 1 MG/DOSE, (OZEMPIC, 1 MG/DOSE,) 4 MG/3ML SOPN Inject 1 mg into the skin once a week. 02/17/23   Carlus Pavlov, MD    Physical Exam: Vitals:   04/12/23 2352 04/12/23 2357 04/13/23 0300 04/13/23 0430  BP: (!) 162/67  (!) 179/62 (!) 144/70  Pulse: 67  65 75  Resp: 20  12 19   Temp: 98.7 F (37.1 C)     TempSrc: Oral     SpO2: 96%  93% 90%  Weight:  55.8 kg    Height:  5\' 3"  (1.6 m)     General exam: Appears calm and comfortable  Respiratory system: Clear to auscultation. Respiratory effort normal. Cardiovascular system: S1 & S2 heard,irregularly irregular,   Gastrointestinal system: Abdomen is nondistended, soft and nontender.  Central nervous system: Alert and oriented. No focal neurological deficits. Extremities: Symmetric 5 x 5 power. Skin: No rashes, lesions or ulcers Psychiatry: Mood & affect appropriate.   Data Reviewed: Results for orders placed or performed during the hospital encounter of 04/13/23 (from the past 24 hour(s))  Basic metabolic panel     Status: Abnormal   Collection Time: 04/13/23 12:53 AM  Result Value Ref Range   Sodium 136 135 - 145 mmol/L   Potassium 3.4 (L) 3.5 - 5.1 mmol/L   Chloride 104 98 - 111 mmol/L   CO2 26 22 - 32 mmol/L   Glucose, Bld 175 (H) 70 - 99 mg/dL   BUN 20 8 - 23 mg/dL   Creatinine, Ser 6.04  (H) 0.44 - 1.00 mg/dL   Calcium 8.6 (L) 8.9 - 10.3 mg/dL   GFR, Estimated 45 (L) >60 mL/min  Anion gap 6 5 - 15  CBC     Status: Abnormal   Collection Time: 04/13/23 12:53 AM  Result Value Ref Range   WBC 8.9 4.0 - 10.5 K/uL   RBC 3.91 3.87 - 5.11 MIL/uL   Hemoglobin 12.0 12.0 - 15.0 g/dL   HCT 16.1 09.6 - 04.5 %   MCV 94.6 80.0 - 100.0 fL   MCH 30.7 26.0 - 34.0 pg   MCHC 32.4 30.0 - 36.0 g/dL   RDW 40.9 (H) 81.1 - 91.4 %   Platelets 200 150 - 400 K/uL   nRBC 0.0 0.0 - 0.2 %  Troponin I (High Sensitivity)     Status: Abnormal   Collection Time: 04/13/23 12:53 AM  Result Value Ref Range   Troponin I (High Sensitivity) 25 (H) <18 ng/L  Urinalysis, Routine w reflex microscopic -Urine, Clean Catch     Status: Abnormal   Collection Time: 04/13/23  1:25 AM  Result Value Ref Range   Color, Urine YELLOW YELLOW   APPearance CLOUDY (A) CLEAR   Specific Gravity, Urine 1.013 1.005 - 1.030   pH 6.0 5.0 - 8.0   Glucose, UA NEGATIVE NEGATIVE mg/dL   Hgb urine dipstick MODERATE (A) NEGATIVE   Bilirubin Urine NEGATIVE NEGATIVE   Ketones, ur NEGATIVE NEGATIVE mg/dL   Protein, ur 782 (A) NEGATIVE mg/dL   Nitrite NEGATIVE NEGATIVE   Leukocytes,Ua LARGE (A) NEGATIVE   RBC / HPF >50 0 - 5 RBC/hpf   WBC, UA >50 0 - 5 WBC/hpf   Bacteria, UA RARE (A) NONE SEEN   Squamous Epithelial / HPF 0-5 0 - 5 /HPF   Mucus PRESENT    Non Squamous Epithelial 0-5 (A) NONE SEEN  Troponin I (High Sensitivity)     Status: Abnormal   Collection Time: 04/13/23  2:59 AM  Result Value Ref Range   Troponin I (High Sensitivity) 31 (H) <18 ng/L  Magnesium     Status: None   Collection Time: 04/13/23  2:59 AM  Result Value Ref Range   Magnesium 1.9 1.7 - 2.4 mg/dL  CBG monitoring, ED     Status: Abnormal   Collection Time: 04/13/23 11:54 AM  Result Value Ref Range   Glucose-Capillary 142 (H) 70 - 99 mg/dL   *Note: Due to a large number of results and/or encounters for the requested time period, some results have  not been displayed. A complete set of results can be found in Results Review.     Assessment and Plan:   Chest pain Atypical.  Mildly elevated troponins.  EKG does not show any ischemic changes.  Echocardiogram ordered.  Cardiology consulted for further recommendations.    HFrEF She appears compensated.    Atrial fibrillation  S/p PPM Interrogated. Rate controlled,  Continue with eliquis.   Hypokalemia Replaced.     Mild UTI;  Complete 3 days of IV antibiotics.   CAD s/p PCI.  Continue with plavix.  Follow up echo.   Hypothyroidism:  Resume synthroid.   Prolonged Qtc Keep k>4, magnesium >2.     Advance Care Planning:   Code Status: Full Code   Consults: cardiology.   Family Communication: none at bedside.   Severity of Illness: The appropriate patient status for this patient is OBSERVATION. Observation status is judged to be reasonable and necessary in order to provide the required intensity of service to ensure the patient's safety. The patient's presenting symptoms, physical exam findings, and initial radiographic and laboratory data in the context  of their medical condition is felt to place them at decreased risk for further clinical deterioration. Furthermore, it is anticipated that the patient will be medically stable for discharge from the hospital within 2 midnights of admission.   Author: Kathlen Mody, MD 04/13/2023 8:43 AM  For on call review [email or web address].

## 2023-04-13 NOTE — Consult Note (Addendum)
Cardiology Consultation   Patient ID: Virginia Huber MRN: [id number]; DOB: 1946/02/26  Admit date: 04/13/2023 Date of Consult: 04/13/2023  PCP:  Assunta Found, MD   [facility] Providers Cardiologist:  Nona Dell, MD  Electrophysiologist:  Will Jorja Loa, MD       Patient Profile:   Virginia Huber is a 77 y.o. female with a hx of CAD, PAF, pacemaker, HTN, orthostatic hypotension, OSA on CPAP, ICM, PAD who is being seen 04/13/2023 for the evaluation of chest pain at the request of Dr. Blake Divine.  History of Present Illness:   Ms. Gossett has a history of with a history of CAD PTCA OM1 2016, DES Cfx 2016 NSTEMI DES dRCA 2017 with 100% OM1. Cath 04/2022 nonobstructive CAD with diffuse small vessel disease,  medical management. , PAD, HFrEF, HTN, SSS s/p PPM, OSA, atrial flutter, atrial fibrillation, Afib ablation 07/2022,  s/p DCCV 09/2022 on amiodarone but recurrent Afib 3 days later and repeat DCCV 10/29/22 on higher dose Amio. Device check 04/08/23 normal function, Afib burden 1.8% 15 atrial arrhythmias treated with ATP therapy and 6 were successful.  EF 35-40% on TEE 11/2022.   Patient admitted with chest pain and variable HR's. She says it felt like when she goes into Afib but lasted for a couple hours. Her chest started hurting and unrelieved with NTG. She says HR got up to 149 and ? Down to 33/m. It has settled down and no further pain although pacer is kicking in when she has Afib on telemetry. Device check yesterday appears to be occasionally undersensing on atrial lead during Afib events. She is very inactive due to chronic fatigue but she denies recent angina. troponin 25, 31, K 3.4 Crt 1.24. received a dose of rocephin for ? Infiltrate on CXR.   Past Medical History:  Diagnosis Date   (HFpEF) heart failure with preserved ejection fraction (HCC)    Advanced nonexudative age-related macular degeneration of left eye with subfoveal involvement 06/26/2020    Ongoing, accounts for acuity   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 h/o PAF/flutter during admission in 2013 for PNA. b. PAF during adm for NSTEMI 07/2015, subsequent paroxysms since then.   B12 deficiency anemia    Latissa Frick retinal vein occlusion with macular edema of right eye 10/11/2019   Chronic renal failure, stage 3b (HCC) 09/23/2017   Coronary artery disease 11/30/2014   a. remote MI. b. h/o PTCA with scoring balloon to OM1 11/2014. c. NSTEMI 03/2015 s/p DES to prox-mid Cx. d. NSTEMI 07/2015 s/p scoring balloon/PTCA/DES to dRCA with PAF during that admission   Cutaneous lupus erythematosus    Early stage nonexudative age-related macular degeneration of right eye 02/27/2020   Essential hypertension    GERD (gastroesophageal reflux disease)    History of blood transfusion 1980's   2nd surgical procedures   Hypercholesteremia    Hypothyroidism    Myocardial infarction (HCC) 02/2012   NSTEMI (non-ST elevated myocardial infarction) (HCC) 04/02/2015   OSA (obstructive sleep apnea) 05/13/2016   Ovarian tumor    PAD (peripheral artery disease) (HCC)    a. s/p LE angio 2015; followed by Dr. Kirke Corin - managed medically.   Pericardial effusion    a. 06/2016 after ppm - s/p pericardiocentesis.   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 s/p Medtronic PPM 06/2016, c/b lead perf/pericardial effusion.   TIA (transient ischemic attack)    Type 2 diabetes with nephropathy (HCC) 02/29/2012    Past Surgical History:  Procedure Laterality Date   A-FLUTTER ABLATION N/A  12/08/2022   Procedure: A-FLUTTER ABLATION;  Surgeon: Regan Lemming, MD;  Location: [facility] INVASIVE CV LAB;  Service: Cardiovascular;  Laterality: N/A;   ABDOMINAL AORTAGRAM N/A 01/03/2014   Procedure: ABDOMINAL Ronny Flurry;  Surgeon: Iran Ouch, MD;  Location: [facility] CATH LAB;  Service: Cardiovascular;  Laterality: N/A;   ABDOMINAL HYSTERECTOMY  06/22/1970   "partial"   APPENDECTOMY  02/20/1969   ATRIAL FIBRILLATION ABLATION N/A 08/14/2022   Procedure: ATRIAL FIBRILLATION ABLATION;  Surgeon: Regan Lemming, MD;  Location: [facility] CV [facility];  Service: Cardiovascular;  Laterality: N/A;   ATRIAL FIBRILLATION ABLATION N/A 12/08/2022   Procedure: ATRIAL FIBRILLATION ABLATION;  Surgeon: Regan Lemming, MD;  Location: [facility];  Service: Cardiovascular;  Laterality: N/A;   CARDIAC CATHETERIZATION  06/22/2006   Tiny OM-2 with 90% narrowing. Med tx.   CARDIAC CATHETERIZATION N/A 11/30/2014   Procedure: Left Heart Cath and Coronary Angiography;  Surgeon: Lennette Bihari, MD; LAD 20%, CFX 50%, OM1 95%, right PLB 30%, LV normal    CARDIAC CATHETERIZATION N/A 11/30/2014   Procedure: Coronary Balloon Angioplasty;  Surgeon: Lennette Bihari, MD;  Angiosculpt scoring balloon and PTCA to the OM1 reducing stenosis from 95% to less than 10%   CARDIAC CATHETERIZATION N/A 04/03/2015   Procedure: Left Heart Cath and Coronary Angiography;  Surgeon: Dolores Patty, MD; dLAD 50%, CFX 90%, OM1 100%, PLA 15%, LVEDP 13     CARDIAC CATHETERIZATION N/A 04/03/2015   Procedure: Coronary Stent Intervention;  Surgeon: Tonny Bollman, MD; 3.0x18 mm Xience DES to the CFX     CARDIAC CATHETERIZATION N/A 08/02/2015   Procedure: Left Heart Cath and Coronary Angiography;  Surgeon: Lennette Bihari, MD;  Location: [facility] INVASIVE CV LAB;  Service: Cardiovascular;  Laterality: N/A;   CARDIAC CATHETERIZATION N/A 08/02/2015   Procedure: Coronary Stent Intervention;  Surgeon: Lennette Bihari, MD;  [facility]: [facility] INVAS[facility]  CV LAB;  Service: Cardiovascular;  Laterality: N/A;   CARDIAC CATHETERIZATION N/A 06/25/2016   Procedure: Pericardiocentesis;  Surgeon: Will Jorja Loa, MD;  Location: [facility];  Service: Cardiovascular;  Laterality: N/A;   cardiac stents     CARDIOVERSION N/A 12/15/2017   Procedure: CARDIOVERSION;  Surgeon: Elease Hashimoto Deloris Ping, MD;  Location: [facility] ENDOSCOPY;  Service: Cardiovascular;  Laterality: N/A;   CARDIOVERSION N/A 05/12/2022   Procedure: CARDIOVERSION;  Surgeon: Quintella Reichert, MD;  Location: [facility] ENDOSCOPY;  Service: Cardiovascular;  Laterality: N/A;   CARDIOVERSION N/A 09/30/2022   Procedure: CARDIOVERSION;  Surgeon: Thurmon Fair, MD;  Location: [facility] CV [facility];  Service: Cardiovascular;  Laterality: N/A;   CARDIOVERSION N/A 10/29/2022   Procedure: CARDIOVERSION;  Surgeon: Quintella Reichert, MD;  Location: [facility] CV LAB;  Service: Cardiovascular;  Laterality: N/A;   CHOLECYSTECTOMY OPEN  02/20/1989   COLONOSCOPY  06/23/2003   Dr. Karilyn Cota: pancolonic divericula, polyp, path unknown currently   COLONOSCOPY  06/22/2010   Dr. Darrick Penna: Normal TI, scattered diverticula in entire colon, small internal hemorrhoids, normal colon biopsies. Colonoscopy in 5-10 years.  COLONOSCOPY WITH PROPOFOL N/A 06/02/2021   pancolonic diverticulosis. Two 4-6 mm polyps in transverse colon. Sessile serrated and hyperplastic. 5 year surveillance if benefits outweigh the risks.   COLOSTOMY  05/23/1979   COLOSTOMY REVERSAL  11/21/1979   EP IMPLANTABLE DEVICE N/A 06/25/2016   Procedure: Lead Revision/Repair;  Surgeon: Will Jorja Loa, MD;  Location: [facility];  Service: Cardiovascular;  Laterality: N/A;   EP IMPLANTABLE DEVICE N/A 06/25/2016   Procedure: Pacemaker Implant;  Surgeon: Will Jorja Loa, MD;  Location: [facility] CV LAB;  Service: Cardiovascular;  Laterality: N/A;   EXCISIONAL HEMORRHOIDECTOMY  02/20/1969   EYE SURGERY Left 06/22/1998   "Aziza Stuckert vein occlusion"   EYE  SURGERY Left ~ 2001   "smoothed out wrinkle"   LEFT HEART CATH AND CORONARY ANGIOGRAPHY N/A 05/11/2022   Procedure: LEFT HEART CATH AND CORONARY ANGIOGRAPHY;  Surgeon: Kathleene Hazel, MD;  Location: [facility] INVAS[facility] CV LAB;  Service: Cardiovascular;  Laterality: N/A;   LEFT OOPHORECTOMY  05/23/1979   nicked bowel, peritonitis, colostomy; colostomy reversed 1981    LOWER EXTREMITY ANGIOGRAM N/A 01/03/2014   Procedure: LOWER EXTREMITY ANGIOGRAM;  Surgeon: Iran Ouch, MD;  Location: [facility] CATH LAB;  Service: Cardiovascular;  Laterality: N/A;   Nuclear med stress test  10/21/2011   Small area of mild ischemia inferoapically.   PARTIAL HYSTERECTOMY  02/20/1969   left ovaries, then ovaries removed later due tumors    POLYPECTOMY  06/02/2021   Procedure: POLYPECTOMY;  Surgeon: Lanelle Bal, DO;  Location: [facility] ENDO [facility];  Service: Endoscopy;;   right eye surgery  03/2021   RIGHT OOPHORECTOMY  02/20/1969   TEE WITHOUT CARDIOVERSION N/A 05/12/2022   Procedure: TRANSESOPHAGEAL ECHOCARDIOGRAM (TEE);  Surgeon: Quintella Reichert, MD;  Location: [facility] ENDOSCOPY;  Service: Cardiovascular;  Laterality: N/A;   TEE WITHOUT CARDIOVERSION N/A 08/14/2022   Procedure: TRANSESOPHAGEAL ECHOCARDIOGRAM;  Surgeon: Regan Lemming, MD;  Location: [facility] INVASIVE CV LAB;  Service: Cardiovascular;  Laterality: N/A;   TEE WITHOUT CARDIOVERSION N/A 12/08/2022   Procedure: TRANSESOPHAGEAL ECHOCARDIOGRAM;  Surgeon: Regan Lemming, MD;  Location: [facility] INVASIVE CV LAB;  Service: Cardiovascular;  Laterality: N/A;     Home Medications:  Prior to Admission medications   Medication Sig Start Date End Date Taking? Authorizing Provider  acetaminophen (TYLENOL) 500 MG tablet Take 1,000 mg by mouth every 8 (eight) hours as needed for headache, moderate pain or mild pain.   Yes [provider]  ALPRAZolam (XANAX) 0.25 MG tablet Take 0.5-1 tablets (0.125-0.25 mg total) by mouth at bedtime as needed for sleep.  03/15/23  Yes Dohmeier, Porfirio Mylar, MD  atorvastatin (LIPITOR) 80 MG tablet TAKE ONE TABLET BY MOUTH EVERY DAY AT 6:00PM Patient taking differently: Take 80 mg by mouth daily. 10/06/18  Yes Laqueta Linden, MD  calcitRIOL (ROCALTROL) 0.25 MCG capsule Take 0.25 mcg by mouth every Monday, Wednesday, and Friday. 12/24/21  Yes [provider]  carvedilol (COREG) 25 MG tablet Take 1 tablet (25 mg total) by mouth 2 (two) times daily. 12/28/22  Yes Jonelle Sidle, MD  Cholecalciferol 50 MCG (2000 UT) TABS Take 4,000 Units by mouth at bedtime.   Yes [provider]  clopidogrel (PLAVIX) 75 MG tablet TAKE ONE TABLET (75MG  TOTAL) BY MOUTH DAILY Patient taking differently: Take 75 mg by mouth daily. 03/20/19  Yes Laqueta Linden, MD  ELIQUIS 5 MG TABS tablet TAKE ONE TABLET BY MOUTH TWICE DAILY 12/28/22  Yes Fenton, Clint R, PA  furosemide (LASIX)  40 MG tablet Take 40 mg by mouth daily as needed for fluid or edema.   Yes [provider]  Insulin NPH, Human,, Isophane, (NOVOLIN N FLEXPEN) 100 UNIT/ML Kiwkpen Inject 15 units subcutaneously in the morning & inject 4 units subcutaneously at night. 02/01/23  Yes Carlus Pavlov, MD  levothyroxine (SYNTHROID) 112 MCG tablet TAKE ONE TABLET BY MOUTH ONCE DAILY 07/10/22  Yes Carlus Pavlov, MD  nitroGLYCERIN (NITROSTAT) 0.4 MG SL tablet Place 1 tablet (0.4 mg total) under the tongue every 5 (five) minutes x 3 doses as needed for chest pain (If not relief after 3rd dose, call 911 or go to ED). 05/18/22  Yes Sharlene Dory, NP  rOPINIRole (REQUIP) 0.5 MG tablet Take 1 tablet (0.5 mg total) by mouth at bedtime. 11/03/22  Yes Dohmeier, Porfirio Mylar, MD  Semaglutide, 1 MG/DOSE, (OZEMPIC, 1 MG/DOSE,) 4 MG/3ML SOPN Inject 1 mg into the skin once a week. 02/17/23  Yes Carlus Pavlov, MD  Continuous Blood Gluc Receiver (FREESTYLE LIBRE 2 READER) DEVI 1 each by Does not apply route daily. 06/24/20   Carlus Pavlov, MD  Continuous Glucose Sensor  (FREESTYLE LIBRE 2 SENSOR) MISC USE ONE FOR FOURTEEN DAYS 03/09/23   Carlus Pavlov, MD  glucose blood (FREESTYLE PRECISION NEO TEST) test strip Use as instructed 05/23/21   Carlus Pavlov, MD    Inpatient Medications: Scheduled Meds:  potassium chloride  40 mEq Oral Once   Continuous Infusions:  PRN Meds:   Allergies:    Allergies  Allergen Reactions   Penicillins Hives    Has patient had a PCN reaction causing immediate rash, facial/tongue/throat swelling, SOB or lightheadedness with hypotension: Yes Has patient had a PCN reaction causing severe rash involving mucus membranes or skin necrosis: No Has patient had a PCN reaction that required hospitalization No Has patient had a PCN reaction occurring within the last 10 years: No If all of the above answers are "NO", then may proceed with Cephalosporin use.   Percocet [Oxycodone-Acetaminophen] Nausea And Vomiting    Social History:   Social History   Socioeconomic History   Marital status: Married    Spouse name: Not on file   Number of children: Not on file   Years of education: Not on file   Highest education level: Not on file  Occupational History   Occupation: Retired    Associate Professor: RETIRED    Comment: insurance billing  Tobacco Use   Smoking status: Never    Passive exposure: Never   Smokeless tobacco: Never   Tobacco comments:    Never smoke 10/08/22  Vaping Use   Vaping status: Never Used  Substance and Sexual Activity   Alcohol use: No    Alcohol/week: 0.0 standard drinks of alcohol   Drug use: No   Sexual activity: Never    Birth control/protection: Surgical    Comment: hyst  Other Topics Concern   Not on file  Social History Narrative   Not on file   Social Determinants of Health   Financial Resource Strain: Not on file  Food Insecurity: No Food Insecurity (04/13/2023)   Hunger Vital Sign    Worried About Running Out of Food in the Last Year: Never true    Ran Out of Food in the Last Year:  Never true  Transportation Needs: No Transportation Needs (04/13/2023)   PRAPARE - Administrator, Civil Service (Medical): No    Lack of Transportation (Non-Medical): No  Physical Activity: Not on file  Stress: Not on  file  Social Connections: Not on file  Intimate Partner Violence: Not At Risk (04/13/2023)   Humiliation, Afraid, Rape, and Kick questionnaire    Fear of Current or Ex-Partner: No    Emotionally Abused: No    Physically Abused: No    Sexually Abused: No    Family History:     Family History  Problem Relation Age of Onset   Heart disease Mother        deceased   Heart disease Father        deceased, heart disease   Diabetes Brother    Heart disease Brother    Thyroid disease Brother    Heart disease Sister    Heart disease Brother    Thyroid disease Brother    Lupus Daughter    Colon cancer Neg Hx      ROS:  Please see the history of present illness.  Review of Systems  Constitutional: Positive for malaise/fatigue.  HENT: Negative.    Eyes: Negative.   Cardiovascular:  Positive for chest pain, irregular heartbeat and palpitations.  Respiratory:  Positive for shortness of breath.   Hematologic/Lymphatic: Negative.   Musculoskeletal: Negative.  Negative for joint pain.  Gastrointestinal: Negative.   Genitourinary: Negative.   Neurological:  Positive for weakness.    All other ROS reviewed and negative.     Physical Exam/Data:   Vitals:   04/13/23 0300 04/13/23 0430 04/13/23 1153 04/13/23 1326  BP: (!) 179/62 (!) 144/70 (!) 167/53 (!) 165/85  Pulse: 65 75  (!) 107  Resp: 12 19 15 19   Temp:    98 F (36.7 C)  TempSrc:      SpO2: 93% 90%  99%  Weight:    58.1 kg  Height:    5\' 3"  (1.6 m)   No intake or output data in the 24 hours ending 04/13/23 1403    04/13/2023    1:26 PM 04/12/2023   11:57 PM 03/23/2023   10:34 AM  Last 3 Weights  Weight (lbs) 128 lb 1.4 oz 123 lb 118 lb  Weight (kg) 58.1 kg 55.792 kg 53.524 kg     Body  mass index is 22.69 kg/m.  General:  Thin, elderly , in no acute distress  HEENT: normal Neck: no JVD Vascular: No carotid bruits; Distal pulses 2+ bilaterally Cardiac:  normal S1, S2; irreg irreg no murmur   Lungs:  decreased breath sounds at bases clear to auscultation bilaterally, no wheezing, rhonchi or rales  Abd: soft, nontender, no hepatomegaly  Ext: no edema Musculoskeletal:  No deformities, BUE and BLE strength normal and equal Skin: warm and dry  Neuro:  CNs 2-12 intact, no focal abnormalities noted Psych:  Normal affect   EKG:  The EKG was personally reviewed and demonstrates:  NSR with LVH no acute change Telemetry:  Telemetry was personally reviewed and demonstrates:  Afib with some fast rates and atrial sensing.  Relevant CV Studies:   TEE on 05/12/2022: 1. Left ventricular ejection fraction, by estimation, is 50 to 55%. The  left ventricle has low normal function. The left ventricle has no regional  wall motion abnormalities. There is severe concentric left ventricular  hypertrophy.   2. Right ventricular systolic function is normal. The right ventricular  size is normal.   3. Left atrial size was mildly dilated. No left atrial/left atrial  appendage thrombus was detected. The LAA emptying velocity was 16 cm/s.   4. The mitral valve is normal in structure. Mild mitral valve  regurgitation. No evidence of mitral stenosis.   5. The aortic valve is tricuspid. Aortic valve regurgitation is not  visualized. Aortic valve sclerosis/calcification is present, without any  evidence of aortic stenosis.   6. The inferior vena cava is normal in size with greater than 50%  respiratory variability, suggesting right atrial pressure of 3 mmHg.   Conclusion(s)/Recommendation(s): Normal biventricular function without  evidence of hemodynamically significant valvular heart disease.     Left heart cath on 05/11/22:   Prox Cx to Mid Cx lesion is 10% stenosed.   1st Mrg lesion is  100% stenosed.   Ost LAD to Mid LAD lesion is 30% stenosed.   Mid LAD to Dist LAD lesion is 30% stenosed.   Ost RCA to Prox RCA lesion is 40% stenosed.   RPAV-1 lesion is 20% stenosed.   RPAV-2 lesion is 70% stenosed.   2nd RPL lesion is 70% stenosed.   The LAD is a moderate caliber vessel that courses to the apex. There appears to be diffuse mild to moderate disease in the entire LAD without a focal stenosis.  2. The Circumflex has a patent proximal stented segment. This vessel terminates into an obtuse marginal Niyana Chesbro. The small caliber first obtuse marginal Kaytie Ratcliffe is chronically occluded.  3. The RCA is a large caliber dominant vessel with a patent stent in the posterolateral artery. The small caliber distal posterolateral artery beyond the stented segment has moderately severe disease at a bifurcation but the vessels appear too small for PCI.  4. Normal LVEDP   Recommendations: Medical management of CAD. Consider adding Imdur or Ranexa for angina in setting of diffuse small vessel disease.      2D Echo complete on 05/10/2022: 1. Severe hypokinesis of the anteroseptal wall with overall mild LV  dysfunction.   2. Left ventricular ejection fraction, by estimation, is 40 to 45%. The  left ventricle has mildly decreased function. The left ventricle  demonstrates regional wall motion abnormalities (see scoring  diagram/findings for description). There is severe  concentric left ventricular hypertrophy. Left ventricular diastolic  parameters are indeterminate.   3. Right ventricular systolic function is normal. The right ventricular  size is normal.   4. The mitral valve is normal in structure. Trivial mitral valve  regurgitation. No evidence of mitral stenosis.   5. The aortic valve is tricuspid. Aortic valve regurgitation is not  visualized. Aortic valve sclerosis is present, with no evidence of aortic  valve stenosis.   6. The inferior vena cava is normal in size with greater than 50%   respiratory variability, suggesting right atrial pressure of 3 mmHg.   Lexiscan on 01/20/2018: Downsloping ST segment depression ST segment depression of 0.5 mm was noted during stress in the II, III, aVF, V5 and V6 leads. Defect 1: There is a small defect of mild severity present in the apical anterior and apical septal location. This likely represents soft tissue attenuation. A small degree of scar cannot entirely be excluded. No signficant ischemia. This is a low risk study. Nuclear stress EF: 64%.   Left heart cath on 08/02/2015: 1st RPLB lesion, 50% stenosed. Dist RCA lesion, 30% stenosed. Ost 1st Mrg to 1st Mrg lesion, 100% stenosed. Ost LAD lesion, 40% stenosed. Mid LAD lesion, 40% stenosed. Post Atrio lesion, 90% stenosed. Post intervention, there is a 0% residual stenosis. The left ventricular systolic function is normal. Mid RCA lesion, 20% stenosed.   Normal LV function without focal segmental wall motion abnormalities and ejection fraction of 55%.  Significant coronary obstructive disease with diffuse mild luminal narrowing of the LAD with 40% proximal and mid stenoses; widely patent stent in the proximal circumflex with old occlusion of the marginal Lemon Whitacre which had arisen in the region of the stented segment but with evidence for very faint collateralization to this distal marginal vessel from the the LAD; a large dominant RCA with 20%, mild mid narrowing, 30% narrowing after the acute margin and focal 90% stenosis distally prior to a PDA 2 vessel with 50% narrowing at the ostium of this PDA prior to a bend in the vessel with the RCA ending in the PLA vessel.  TEE 08/14/22 demonstrated   1. Left ventricular ejection fraction, by estimation, is 30 to 35%. The  left ventricle has moderately decreased function. The left ventricle  demonstrates global hypokinesis.   2. Right ventricular systolic function is mildly reduced. The right  ventricular size is normal.   3. Left atrial  size was mildly dilated. No left atrial/left atrial  appendage thrombus was detected.   4. The mitral valve is normal in structure. Mild mitral valve  regurgitation. No evidence of mitral stenosis.   5. The aortic valve is tricuspid. Aortic valve regurgitation is not  visualized. No aortic stenosis is present.   6. The inferior vena cava is normal in size with greater than 50%  respiratory variability, suggesting right atrial pressure of 3 mmHg.    Laboratory Data:  High Sensitivity Troponin:   Recent Labs  Lab 04/13/23 0053 04/13/23 0259 04/13/23 1310  TROPONINIHS 25* 31* 30*     Chemistry Recent Labs  Lab 04/13/23 0053 04/13/23 0259  NA 136  --   K 3.4*  --   CL 104  --   CO2 26  --   GLUCOSE 175*  --   BUN 20  --   CREATININE 1.24*  --   CALCIUM 8.6*  --   MG  --  1.9  GFRNONAA 45*  --   ANIONGAP 6  --     No results for input(s): "PROT", "ALBUMIN", "AST", "ALT", "ALKPHOS", "BILITOT" in the last 168 hours. Lipids No results for input(s): "CHOL", "TRIG", "HDL", "LABVLDL", "LDLCALC", "CHOLHDL" in the last 168 hours.  Hematology Recent Labs  Lab 04/13/23 0053  WBC 8.9  RBC 3.91  HGB 12.0  HCT 37.0  MCV 94.6  MCH 30.7  MCHC 32.4  RDW 15.9*  PLT 200   Thyroid No results for input(s): "TSH", "FREET4" in the last 168 hours.  BNPNo results for input(s): "BNP", "PROBNP" in the last 168 hours.  DDimer No results for input(s): "DDIMER" in the last 168 hours.   Radiology/Studies:  DG Chest 2 View  Result Date: 04/13/2023 CLINICAL DATA:  Chest pain and discomfort. EXAM: CHEST - 2 VIEW COMPARISON:  08/14/2022. FINDINGS: The heart is borderline enlarged and mediastinal contours are within normal limits. Strandy opacities are noted at the lung bases. There are small bilateral pleural effusions. No pneumothorax. A dual lead pacemaker device is present over the left chest. Surgical clips are identified in the right upper quadrant. Degenerative changes are present in the  thoracic spine. No acute osseous abnormality. IMPRESSION: Small bilateral pleural effusions with atelectasis or infiltrate. Electronically Signed   By: Thornell Sartorius M.D.   On: 04/13/2023 01:22     Assessment and Plan:   Chest pain in the setting of recurrent Afib with occasional undersensing of atrial lead during Afib on pacer check. No angina prior to last night or  since. She has a history of CAD PTCA OM1 2016, DES Cfx 2015, DES RCA 2017. Troponins flat at 25 and 31. Suspect arrhythmia related K 3.4 -replaced  PAF/Aflutter s/p ablation 07/2022 with recurrence and multiple DCCV's on amiodarone. Most recent pacer check 04/08/23 AFib 1.8% time. Pacer check 04/12/23  occasional undersensing of atrial lead during Afib -followed by Dr. Elberta Fortis.  SSS, s/p Pacemaker Remote device check 04/08/23 and 04/12/23 persistent A-fib.See above. Continue to follow up with EP. -continue Eliquis     Chronic systolic CHF TEE EF 35-40% with mild RV dysfunction currently compensated.     Hypertension Blood pressure  up today. Coreg, lasix ordered but hasn't received yet.    SSS, s/p Pacemaker Remote device check 04/08/23 and 04/12/23 persistent A-fib. Continue to follow up with EP.    Orthostatic hypotension Denies any recent symptoms.  Continue current medication regimen.  Continue to follow with PCP.    OSA on CPAP Does report wearing CPAP. Continue to follow with PCP.      Risk Assessment/Risk Scores:     TIMI Risk Score for Unstable Angina or Non-ST Elevation MI:   The patient's TIMI risk score is 4, which indicates a 20% risk of all cause mortality, new or recurrent myocardial infarction or need for urgent revascularization in the next 14 days.    CHA2DS2-VASc Score = 6   This indicates a 9.7% annual risk of stroke. The patient's score is based upon: CHF History: 1 HTN History: 1 Diabetes History: 0 Stroke History: 0 Vascular Disease History: 1 Age Score: 2 Gender Score: 1          For questions or updates, please contact [facility] Please consult [email or web address] for contact info under    Signed, Jacolyn Reedy, PA-C  04/13/2023 2:03 PM    Patient seen and discussed with PA Geni Bers, I agree with her documentation. 77 yo female history of CAD  with PTCA to OM1 and DES to LCX in 2016, DES to RCA in 2017. HTN, PAF, OSA, CKD 3B, SSS with pacemaker, orthostatic hypotension, chronic HFmrEF,  PAF/aflutter with with prior ablations presents with chest pains Chest pressure upper mid chest chest with mild SOB and associated palpitations. Checked heart rate at home and in 140s. Constant symptoms for about 4 hours, then resolved.    K 3.4 BUN 20 Cr 1.24 Mg 1.9 WBC 8.9 Hgb 12 Plt 200  Trop 25--> 31      (chronically elevated low 20s) EKG SR, chronic LBBB CXR small bilateral effusions    04/2022 cath: LAD 30%, LCX patent stent, OM1 CTO, RCA 40%, RPAV 1 20% RPAV 2 70%, 2nd RPL 70% 09/2022 echo: LVEF 35-40% 11/2022 TEE: LVEF 35-40% 04/08/23 device check: 1.8% AF burden ER device check:   normal function, episodes of afib noted not specifically corresponding to symptoms last night.    1.Chest pain/CAD - symptoms more suggestive of symptomatic arrhythmia, palpitations with chest pressure constant for 4 hours but varying in severity, variable heart rates at home. Possibly exacerbated by low potassium and UTI - intermittent afib and variable rates this admission - mild flat trop that is chronic from her, EKG without specific ischemic changes.  - cath last year just small vessel disease.  - f/u echo, no plans for ischemic testing this admission. Suspect symptomatic arrhythmia as opposed to angina.    2.Afib/aflutter - history of prior ablations -tremors on amio in the past. Chronic orthostatic dizziness but has been able to stay on av nodal  agent - in general since ablations has continued to have some PAF but overlal low burdens - 10/17 device check 1.8% afib burden.   - perhaps increased episodes of hypokalemia, UTI. Follow rhythm with management of these medical issues.  - monitor on telemetry.    Dina Rich MD

## 2023-04-13 NOTE — ED Notes (Signed)
Medtronic Pacemaker interrogation report given to Dr. Pilar Plate

## 2023-04-13 NOTE — Progress Notes (Signed)
Arrived to room alert , oriented and ambulatory.  Denies chest pain at this point.  Telemetry applied and sitting on side of bed eating lunch.  Husband at bedside.

## 2023-04-13 NOTE — ED Provider Notes (Signed)
[facility]-EMER[facility]ENCY DEPT [facility] Emergency Department Provider Note MRN:  [id number]  Arrival date & time: 04/13/23     Chief Complaint   Chest Pain   History of Present Illness   Virginia TURBERVILLE is a 77 y.o. year-old female with a history of pacemaker, A-fib, CAD presenting to the ED with chief complaint of chest pain.  Central chest pain starting at 9:30 PM, lasted an hour or 2 and then resolved.  No complaints at this time.  Has had some recent hematuria.  Review of Systems  A thorough review of systems was obtained and all systems are negative except as noted in the HPI and PMH.   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 h/o PAF/flutter during admission in 2013 for PNA. b. PAF during adm for NSTEMI 07/2015, subsequent paroxysms since then.   B12 deficiency anemia    Branch retinal vein occlusion with macular edema of right eye 10/11/2019   Chronic renal failure, stage 3b (HCC) 09/23/2017   Coronary artery disease 11/30/2014   a. remote MI. b. h/o PTCA with scoring balloon to OM1 11/2014. c. NSTEMI 03/2015 s/p DES to prox-mid Cx. d. NSTEMI 07/2015 s/p scoring balloon/PTCA/DES to dRCA with PAF during that admission   Cutaneous lupus erythematosus    Early stage nonexudative age-related macular degeneration of right eye 02/27/2020   Essential hypertension    GERD (gastroesophageal reflux disease)    History of blood transfusion 1980's   2nd surgical procedures   Hypercholesteremia    Hypothyroidism    Myocardial infarction (HCC) 02/2012   NSTEMI (non-ST elevated myocardial infarction) (HCC) 04/02/2015   OSA (obstructive sleep apnea)  05/13/2016   Ovarian tumor    PAD (peripheral artery disease) (HCC)    a. s/p LE angio 2015; followed by Dr. Kirke Corin - managed medically.   Pericardial effusion    a. 06/2016 after ppm - s/p pericardiocentesis.   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 s/p Medtronic PPM 06/2016, c/b lead perf/pericardial effusion.   TIA (transient ischemic attack)    Type 2 diabetes with nephropathy (HCC) 02/29/2012    Past Surgical History:  Procedure Laterality Date   A-FLUTTER ABLATION N/A 12/08/2022   Procedure: A-FLUTTER ABLATION;  Surgeon: Regan Lemming, MD;  [facility]: [facility] INVASIVE CV LAB;  Service: Cardiovascular;  Laterality: N/A;   ABDOMINAL AORTAGRAM N/A 01/03/2014   Procedure: ABDOMINAL Ronny Flurry;  Surgeon: Iran Ouch, MD;  Location: [facility] CATH LAB;  Service: Cardiovascular;  Laterality: N/A;   ABDOMINAL HYSTERECTOMY  06/22/1970   "partial"   APPENDECTOMY  02/20/1969   ATRIAL FIBRILLATION ABLATION N/A 08/14/2022   Procedure: ATRIAL FIBRILLATION ABLATION;  Surgeon: Regan Lemming, MD;  Location: [facility] INVASIVE CV LAB;  Service: Cardiovascular;  Laterality: N/A;   ATRIAL FIBRILLATION ABLATION N/A 12/08/2022   Procedure: ATRIAL  FIBRILLATION ABLATION;  Surgeon: Regan Lemming, MD;  Location: [facility] INVASIVE CV LAB;  Service: Cardiovascular;  Laterality: N/A;   CARDIAC CATHETERIZATION  06/22/2006   Tiny OM-2 with 90% narrowing. Med tx.   CARDIAC CATHETERIZATION N/A 11/30/2014   Procedure: Left Heart Cath and Coronary Angiography;  Surgeon: Lennette Bihari, MD; LAD 20%, CFX 50%, OM1 95%, right PLB 30%, LV normal    CARDIAC  CATHETERIZATION N/A 11/30/2014   Procedure: Coronary Balloon Angioplasty;  Surgeon: Lennette Bihari, MD;  Angiosculpt scoring balloon and PTCA to the OM1 reducing stenosis from 95% to less than 10%   CARDIAC CATHETERIZATION N/A 04/03/2015   Procedure: Left Heart Cath and Coronary Angiography;  Surgeon: Dolores Patty, MD; dLAD 50%, CFX 90%, OM1 100%, PLA 15%, LVEDP 13     CARDIAC CATHETERIZATION N/A 04/03/2015   Procedure: Coronary Stent Intervention;  Surgeon: Tonny Bollman, MD; 3.0x18 mm Xience DES to the CFX     CARDIAC CATHETERIZATION N/A 08/02/2015   Procedure: Left Heart Cath and Coronary Angiography;  Surgeon: Lennette Bihari, MD;  Location: [facility] INVASIVE CV LAB;  Service: Cardiovascular;  Laterality: N/A;   CARDIAC CATHETERIZATION N/A 08/02/2015   Procedure: Coronary Stent Intervention;  Surgeon: Lennette Bihari, MD;  [facility];  Service: Cardiovascular;  Laterality: N/A;   CARDIAC CATHETERIZATION N/A 06/25/2016   Procedure: Pericardiocentesis;  Surgeon: Will Jorja Loa, MD;  [facility]: [facility] LAB;  Service: Cardiovascular;  Laterality: N/A;   cardiac stents     CARDIOVERSION N/A 12/15/2017   Procedure: CARDIOVERSION;  Surgeon: Elease Hashimoto Deloris Ping, MD;  Location: [facility] ENDOSCOPY;  Service: Cardiovascular;  Laterality: N/A;   CARDIOVERSION N/A 05/12/2022   Procedure: CARDIOVERSION;  Surgeon: Quintella Reichert, MD;  Location: [facility] ENDOSCOPY;  Service: Cardiovascular;  Laterality: N/A;   CARDIOVERSION N/A 09/30/2022   Procedure: CARDIOVERSION;  Surgeon: Thurmon Fair, MD;  [facility]: [facility] CV LAB;  Service: Cardiovascular;  Laterality: N/A;   CARDIOVERSION N/A 10/29/2022   Procedure: CARDIOVERSION;  Surgeon: Quintella Reichert, MD;  [facility];  Service: Cardiovascular;  Laterality: N/A;   CHOLECYSTECTOMY OPEN  02/20/1989   COLONOSCOPY  06/23/2003   Dr. Karilyn Cota: pancolonic divericula, polyp, path unknown currently   COLONOSCOPY  06/22/2010   Dr.  Darrick Penna: Normal TI, scattered diverticula in entire colon, small internal hemorrhoids, normal colon biopsies. Colonoscopy in 5-10 years.    COLONOSCOPY WITH PROPOFOL N/A 06/02/2021   pancolonic diverticulosis. Two 4-6 mm polyps in transverse colon. Sessile serrated and hyperplastic. 5 year surveillance if benefits outweigh the risks.   COLOSTOMY  05/23/1979   COLOSTOMY REVERSAL  11/21/1979   EP IMPLANTABLE DEVICE N/A 06/25/2016   Procedure: Lead Revision/Repair;  Surgeon: Will Jorja Loa, MD;  [facility] CV LAB;  Service: Cardiovascular;  Laterality: N/A;   EP IMPLANTABLE DEVICE N/A 06/25/2016   Procedure: Pacemaker Implant;  Surgeon: Will Jorja Loa, MD;  [facility]: [facility] LAB;  Service: Cardiovascular;  Laterality: N/A;   EXCISIONAL HEMORRHOIDECTOMY  02/20/1969   EYE SURGERY Left 06/22/1998   "branch vein occlusion"   EYE SURGERY Left ~ 2001   "smoothed out wrinkle"   LEFT HEART CATH AND CORONARY ANGIOGRAPHY N/A 05/11/2022   Procedure: LEFT HEART CATH AND CORONARY ANGIOGRAPHY;  Surgeon: Kathleene Hazel, MD;  Location: [facility] INVASIVE CV LAB;  Service: Cardiovascular;  Laterality: N/A;   LEFT OOPHORECTOMY  05/23/1979   nicked bowel, peritonitis, colostomy; colostomy reversed 1981    LOWER EXTREMITY ANGIOGRAM  N/A 01/03/2014   Procedure: LOWER EXTREMITY ANGIOGRAM;  Surgeon: Iran Ouch, MD;  Location: [facility] CATH LAB;  Service: Cardiovascular;  Laterality: N/A;   Nuclear med stress test  10/21/2011   Small area of mild ischemia inferoapically.   PARTIAL HYSTERECTOMY  02/20/1969   left ovaries, then ovaries removed later due tumors    POLYPECTOMY  06/02/2021   Procedure: POLYPECTOMY;  Surgeon: Lanelle Bal, DO;  Location: [facility] ENDO [facility];  Service: Endoscopy;;   right eye surgery  03/2021   RIGHT OOPHORECTOMY  02/20/1969   TEE WITHOUT CARDIOVERSION N/A 05/12/2022   Procedure: TRANSESOPHAGEAL ECHOCARDIOGRAM (TEE);  Surgeon: Quintella Reichert, MD;  Location: [facility]  ENDOSCOPY;  Service: Cardiovascular;  Laterality: N/A;   TEE WITHOUT CARDIOVERSION N/A 08/14/2022   Procedure: TRANSESOPHAGEAL ECHOCARDIOGRAM;  Surgeon: Regan Lemming, MD;  Location: [facility] INVASIVE CV LAB;  Service: Cardiovascular;  Laterality: N/A;   TEE WITHOUT CARDIOVERSION N/A 12/08/2022   Procedure: TRANSESOPHAGEAL ECHOCARDIOGRAM;  Surgeon: Regan Lemming, MD;  Location: [facility] INVASIVE CV LAB;  Service: Cardiovascular;  Laterality: N/A;    Family History  Problem Relation Age of Onset   Heart disease Mother        deceased   Heart disease Father        deceased, heart disease   Diabetes Brother    Heart disease Brother    Thyroid disease Brother    Heart disease Sister    Heart disease Brother    Thyroid disease Brother    Lupus Daughter    Colon cancer Neg Hx     Social History   Socioeconomic History   Marital status: Married    Spouse name: Not on file   Number of children: Not on file   Years of education: Not on file   Highest education level: Not on file  Occupational History   Occupation: Retired    Associate Professor: RETIRED    Comment: insurance billing  Tobacco Use   Smoking status: Never    Passive exposure: Never   Smokeless tobacco: Never   Tobacco comments:    Never smoke 10/08/22  Vaping Use   Vaping status: Never Used  Substance and Sexual Activity   Alcohol use: No    Alcohol/week: 0.0 standard drinks of alcohol   Drug use: No   Sexual activity: Never    Birth control/protection: Surgical    Comment: hyst  Other Topics Concern   Not on file  Social History Narrative   Not on file   Social Determinants of Health   Financial Resource Strain: Not on file  Food Insecurity: No Food Insecurity (05/10/2022)   Hunger Vital Sign    Worried About Running Out of Food in the Last Year: Never true    Ran Out of Food in the Last Year: Never true  Transportation Needs: No Transportation Needs (05/10/2022)   PRAPARE - Scientist, research (physical sciences) (Medical): No    Lack of Transportation (Non-Medical): No  Physical Activity: Not on file  Stress: Not on file  Social Connections: Not on file  Intimate Partner Violence: Not At Risk (05/10/2022)   Humiliation, Afraid, Rape, and Kick questionnaire    Fear of Current or Ex-Partner: No    Emotionally Abused: No    Physically Abused: No    Sexually Abused: No     Physical Exam   Vitals:   04/12/23 2352 04/13/23 0300  BP: (!) 162/67 (!) 179/62  Pulse: 67 65  Resp:  20 12  Temp: 98.7 F (37.1 C)   SpO2: 96% 93%    CONSTITUTIONAL: Well-appearing, NAD NEURO/PSYCH:  Alert and oriented x 3, no focal deficits EYES:  eyes equal and reactive ENT/NECK:  no LAD, no JVD CARDIO: Regular rate, well-perfused, normal S1 and S2 PULM:  CTAB no wheezing or rhonchi GI/GU:  non-distended, non-tender MSK/SPINE:  No gross deformities, no edema SKIN:  no rash, atraumatic   *Additional and/or pertinent findings included in MDM below  Diagnostic and Interventional Summary    EKG Interpretation Date/Time:  Tuesday April 13 2023 02:27:17 EDT Ventricular Rate:  62 PR Interval:  209 QRS Duration:  139 QT Interval:  493 QTC Calculation: 501 R Axis:   -52  Text Interpretation: Sinus rhythm LVH with IVCD, LAD and secondary repol abnrm Anterior ST elevation, probably due to LVH Prolonged QT interval Confirmed by Kennis Carina [phone]) on 04/13/2023 3:51:06 AM       Labs Reviewed  BASIC METABOLIC PANEL - Abnormal; Notable for the following components:      Result Value   Potassium 3.4 (*)    Glucose, Bld 175 (*)    Creatinine, Ser 1.24 (*)    Calcium 8.6 (*)    GFR, Estimated 45 (*)    All other components within normal limits  CBC - Abnormal; Notable for the following components:   RDW 15.9 (*)    All other components within normal limits  URINALYSIS, ROUTINE W REFLEX MICROSCOPIC - Abnormal; Notable for the following components:   APPearance CLOUDY (*)    Hgb urine  dipstick MODERATE (*)    Protein, ur 100 (*)    Leukocytes,Ua LARGE (*)    Bacteria, UA RARE (*)    Non Squamous Epithelial 0-5 (*)    All other components within normal limits  TROPONIN I (HIGH SENSITIVITY) - Abnormal; Notable for the following components:   Troponin I (High Sensitivity) 25 (*)    All other components within normal limits  TROPONIN I (HIGH SENSITIVITY) - Abnormal; Notable for the following components:   Troponin I (High Sensitivity) 31 (*)    All other components within normal limits    DG Chest 2 View  Final Result      Medications  cefTRIAXone (ROCEPHIN) 1 g in sodium chloride 0.9 % 100 mL IVPB (has no administration in time range)  aspirin chewable tablet 324 mg (has no administration in time range)     Procedures  /  Critical Care .Critical Care  Performed by: Sabas Sous, MD Authorized by: Sabas Sous, MD   Critical care provider statement:    Critical care time (minutes):  32   Critical care was necessary to treat or prevent imminent or life-threatening deterioration of the following conditions: Concern for NSTEMI.   Critical care was time spent personally by me on the following activities:  Development of treatment plan with patient or surrogate, discussions with consultants, evaluation of patient's response to treatment, examination of patient, ordering and review of laboratory studies, ordering and review of radiographic studies, ordering and performing treatments and interventions, pulse oximetry, re-evaluation of patient's condition and review of old charts   ED Course and Medical Decision Making  Initial Impression and Ddx History of CAD, tachybradycardia syndrome with pacemaker here with chest pain, reported that her heart rate was labile at home, as low as 30, as high as 140.  Arrhythmia also considered.  Will interrogate the pacer, will need to troponins.  Past medical/surgical history that increases  complexity of ED encounter: CAD,  pacemaker  Interpretation of Diagnostics I personally reviewed the EKG and my interpretation is as follows:   Bundle branch block pattern, sinus rhythm.  Labs reveal troponin elevation, minimal but slightly uptrending.  Patient Reassessment and Ultimate Disposition/Management     Case discussed with cardiology, patient had some return of chest pain at 4 AM which was brief.  Will admit to hospital service for chest pain rule out.  Patient management required discussion with the following services or consulting groups:  Hospitalist Service and Cardiology  Complexity of Problems Addressed Acute illness or injury that poses threat of life of bodily function  Additional Data Reviewed and Analyzed Further history obtained from: Further history from spouse/family member  Additional Factors Impacting ED Encounter Risk Consideration of hospitalization  Elmer Sow. Pilar Plate, MD [facility] Emergency Medicine [facility] [email or web address]  Final Clinical Impressions(s) / ED Diagnoses     ICD-10-CM   1. Chest pain, unspecified type  R07.9       ED Discharge Orders     None        Discharge Instructions Discussed with and Provided to Patient:   Discharge Instructions   None      Sabas Sous, MD 04/13/23 [phone]

## 2023-04-14 ENCOUNTER — Observation Stay (HOSPITAL_BASED_OUTPATIENT_CLINIC_OR_DEPARTMENT_OTHER): Payer: PPO

## 2023-04-14 DIAGNOSIS — K219 Gastro-esophageal reflux disease without esophagitis: Secondary | ICD-10-CM | POA: Diagnosis not present

## 2023-04-14 DIAGNOSIS — E876 Hypokalemia: Secondary | ICD-10-CM | POA: Diagnosis not present

## 2023-04-14 DIAGNOSIS — R079 Chest pain, unspecified: Secondary | ICD-10-CM | POA: Diagnosis not present

## 2023-04-14 DIAGNOSIS — R319 Hematuria, unspecified: Secondary | ICD-10-CM

## 2023-04-14 DIAGNOSIS — I480 Paroxysmal atrial fibrillation: Secondary | ICD-10-CM | POA: Diagnosis not present

## 2023-04-14 DIAGNOSIS — I5020 Unspecified systolic (congestive) heart failure: Secondary | ICD-10-CM

## 2023-04-14 DIAGNOSIS — N390 Urinary tract infection, site not specified: Secondary | ICD-10-CM | POA: Diagnosis not present

## 2023-04-14 LAB — BASIC METABOLIC PANEL
Anion gap: 8 (ref 5–15)
BUN: 24 mg/dL — ABNORMAL HIGH (ref 8–23)
CO2: 23 mmol/L (ref 22–32)
Calcium: 8.4 mg/dL — ABNORMAL LOW (ref 8.9–10.3)
Chloride: 104 mmol/L (ref 98–111)
Creatinine, Ser: 1.31 mg/dL — ABNORMAL HIGH (ref 0.44–1.00)
GFR, Estimated: 42 mL/min — ABNORMAL LOW (ref >60)
Glucose, Bld: 183 mg/dL — ABNORMAL HIGH (ref 70–99)
Potassium: 4.8 mmol/L (ref 3.5–5.1)
Sodium: 135 mmol/L (ref 135–145)

## 2023-04-14 LAB — ECHOCARDIOGRAM COMPLETE
Area-P 1/2: 3.95 cm2
Height: 63 in
S' Lateral: 4.7 cm
Weight: 2049.4 [oz_av]

## 2023-04-14 LAB — MAGNESIUM: Magnesium: 2.1 mg/dL (ref 1.7–2.4)

## 2023-04-14 MED ORDER — CEFDINIR 300 MG PO CAPS: 300.0000 mg | ORAL_CAPSULE | Freq: Every day | ORAL | 0 refills | Status: AC

## 2023-04-14 MED ORDER — PERFLUTREN LIPID MICROSPHERE
1.0000 mL | INTRAVENOUS | Status: DC | PRN
  Administered 2023-04-14: 5 mL via INTRAVENOUS

## 2023-04-14 NOTE — Progress Notes (Signed)
Progress Note  Patient Name: Virginia Huber Date of Encounter: 04/14/2023  Primary Cardiologist: Nona Dell, MD  Interval Summary   Chart reviewed including cardiology consultation from yesterday.  Patient states that she feels better today, lightheadedness improved.  No chest pain or palpitations and she is in an atrial paced rhythm by telemetry.  Echocardiogram is pending.  Vital Signs    Vitals:   04/13/23 1326 04/13/23 1748 04/13/23 2128 04/14/23 0415  BP: (!) 165/85 (!) 151/79 (!) 197/68 (!) 161/61  Pulse: (!) 107 72 64 66  Resp: 19 18 16 18   Temp: 98 F (36.7 C) 98.1 F (36.7 C) 98.4 F (36.9 C) 99 F (37.2 C)  TempSrc:      SpO2: 99% 98% 94% 92%  Weight: 58.1 kg     Height: 5\' 3"  (1.6 m)       Intake/Output Summary (Last 24 hours) at 04/14/2023 1030 Last data filed at 04/14/2023 0900 Gross per 24 hour  Intake 720 ml  Output --  Net 720 ml   Filed Weights   04/12/23 2357 04/13/23 1326  Weight: 55.8 kg 58.1 kg    Physical Exam   GEN: No acute distress.   Neck: No JVD. Cardiac: RRR, no murmur, rub, or gallop.  Respiratory: Nonlabored. Clear to auscultation bilaterally. GI: Soft, nontender, bowel sounds present. MS: No edema. Neuro:  Nonfocal. Psych: Alert and oriented x 3. Normal affect.  ECG/Telemetry    Telemetry reviewed showing an atrial paced rhythm.  Labs    Chemistry Recent Labs  Lab 04/13/23 0053 04/14/23 0419  NA 136 135  K 3.4* 4.8  CL 104 104  CO2 26 23  GLUCOSE 175* 183*  BUN 20 24*  CREATININE 1.24* 1.31*  CALCIUM 8.6* 8.4*  GFRNONAA 45* 42*  ANIONGAP 6 8    Hematology Recent Labs  Lab 04/13/23 0053  WBC 8.9  RBC 3.91  HGB 12.0  HCT 37.0  MCV 94.6  MCH 30.7  MCHC 32.4  RDW 15.9*  PLT 200   Cardiac Enzymes Recent Labs  Lab 04/13/23 0053 04/13/23 0259 04/13/23 1310  TROPONINIHS 25* 31* 30*   Lipid Panel     Component Value Date/Time   CHOL 96 05/10/2022 0158   TRIG 156 (H) 05/10/2022 0158    HDL 30 (L) 05/10/2022 0158   CHOLHDL 3.2 05/10/2022 0158   VLDL 31 05/10/2022 0158   LDLCALC 35 05/10/2022 0158   LDLCALC 96 08/05/2017 0807    Cardiac Studies   Device interrogation from October 17 revealed 1.8% AF burden.  Assessment & Plan   1.  Paroxysmal atrial fibrillation, CHA2DS2-VASc score of 8.  She is status post prior atrial fibrillation ablation attempts, most recently in June 2024 and has had relatively low rhythm burden based on device interrogation, recently at 1.8%.  Presented with symptomatic arrhythmia in the form of chest discomfort, but no clear evidence of ACS (demand ischemia by high-sensitivity troponin I levels).  Also mildly hypokalemic and with UTI which could be contributors.  Previously did not tolerate amiodarone due to tremors.  Currently on Eliquis and Coreg.  2.  CAD status post angioplasty of the OM1 in 2016, DES to the circumflex in 2016, and DES to the RCA in 2017.  Likely angina in the setting of recurrent atrial fibrillation, but no clear evidence of ACS as discussed above.  3.  Tachycardia-bradycardia syndrome status post Medtronic pacemaker.  She follows with Dr. Elberta Fortis.  4.  CKD stage IIIb.  Creatinine  1.31.  5.  History of orthostatic hypotension.  Hypertensive at present.  Follow-up on echocardiogram pending for today.  Prior LVEF 35 to 40% as of June.  If no substantial change, anticipate discharge home today with office follow-up arranged.  Can continue to address GDMT as an outpatient.  Not clear that further antiarrhythmic therapy is indicated at this point.  For questions or updates, please contact [facility] Please consult [email or web address] for contact info under   Signed, Nona Dell, MD  04/14/2023, 10:30 AM

## 2023-04-14 NOTE — Discharge Instructions (Signed)
IMPORTANT INFORMATION: PAY CLOSE ATTENTION  ? ?PHYSICIAN DISCHARGE INSTRUCTIONS ? ?Follow with Primary care provider  Golding, John, MD  and other consultants as instructed by your Hospitalist Physician ? ?SEEK MEDICAL CARE OR RETURN TO EMERGENCY ROOM IF SYMPTOMS COME BACK, WORSEN OR NEW PROBLEM DEVELOPS  ? ?Please note: ?You were cared for by a hospitalist during your hospital stay. Every effort will be made to forward records to your primary care provider.  You can request that your primary care provider send for your hospital records if they have not received them.  Once you are discharged, your primary care physician will handle any further medical issues. Please note that NO REFILLS for any discharge medications will be authorized once you are discharged, as it is imperative that you return to your primary care physician (or establish a relationship with a primary care physician if you do not have one) for your post hospital discharge needs so that they can reassess your need for medications and monitor your lab values. ? ?Please get a complete blood count and chemistry panel checked by your Primary MD at your next visit, and again as instructed by your Primary MD. ? ?Get Medicines reviewed and adjusted: ?Please take all your medications with you for your next visit with your Primary MD ? ?Laboratory/radiological data: ?Please request your Primary MD to go over all hospital tests and procedure/radiological results at the follow up, please ask your primary care provider to get all Hospital records sent to his/her office. ? ?In some cases, they will be blood work, cultures and biopsy results pending at the time of your discharge. Please request that your primary care provider follow up on these results. ? ?If you are diabetic, please bring your blood sugar readings with you to your follow up appointment with primary care.   ? ?Please call and make your follow up appointments as soon as possible.   ? ?Also Note  the following: ?If you experience worsening of your admission symptoms, develop shortness of breath, life threatening emergency, suicidal or homicidal thoughts you must seek medical attention immediately by calling 911 or calling your MD immediately  if symptoms less severe. ? ?You must read complete instructions/literature along with all the possible adverse reactions/side effects for all the Medicines you take and that have been prescribed to you. Take any new Medicines after you have completely understood and accpet all the possible adverse reactions/side effects.  ? ?Do not drive when taking Pain medications or sleeping medications (Benzodiazepines) ? ?Do not take more than prescribed Pain, Sleep and Anxiety Medications. It is not advisable to combine anxiety,sleep and pain medications without talking with your primary care practitioner ? ?Special Instructions: If you have smoked or chewed Tobacco  in the last 2 yrs please stop smoking, stop any regular Alcohol  and or any Recreational drug use. ? ?Wear Seat belts while driving.  Do not drive if taking any narcotic, mind altering or controlled substances or recreational drugs or alcohol.  ? ? ? ? ? ?

## 2023-04-14 NOTE — Progress Notes (Signed)
   Progress Note  Patient Name: Virginia Huber Date of Encounter: 04/14/2023  Primary Cardiologist: Nona Dell, MD  Follow-up echocardiogram today shows relative improvement in LVEF compared to the prior study, now 45 to 50% range which is mildly reduced.  As per this morning's note, expect she can be discharged today and we will arrange outpatient follow-up for further review.  Would continue Eliquis and Coreg as before.  For questions or updates, please contact [facility] Please consult [email or web address] for contact info under   Signed, Nona Dell, MD  04/14/2023, 2:47 PM

## 2023-04-14 NOTE — Progress Notes (Signed)
   04/14/23 1049  TOC Brief Assessment  Insurance and Status Reviewed  Patient has primary care physician Yes  Home environment has been reviewed HOme with spouse  Prior level of function: independent  Prior/Current Home Services No current home services  Social Determinants of Health Reivew SDOH reviewed no interventions necessary  Readmission risk has been reviewed Yes  Transition of care needs no transition of care needs at this time   Transition of Care Department [facility]) has reviewed patient and no TOC needs have been identified at this time. We will continue to monitor patient advancement through interdisciplinary progression rounds. If new patient transition needs arise, please place a TOC consult.

## 2023-04-14 NOTE — Plan of Care (Signed)

## 2023-04-14 NOTE — Discharge Summary (Signed)
Physician Discharge Summary  [name] WGN:[id number] DOB: 1945/09/10 DOA: 04/13/2023  PCP: Assunta Found, MD  Admit date: 04/13/2023 Discharge date: 04/14/2023  Admitted From:  Home  Disposition:  Home   Recommendations for Outpatient Follow-up:  Follow up with PCP in 1 weeks Follow up with cardiology as scheduled  Discharge Condition: STABLE   CODE STATUS: FULL DIET: resume previous home diet    Brief Hospitalization Summary: Please see all hospital notes, images, labs for full details of the hospitalization. Admission Provider HPI:  77 y.o. female with medical history significant of Atrial fibrillation , HFpEF , stage 3b CKD, CAD s/p PCI, hypertension, GERD, hypothyroidism, OSA, type 2 DM, PAD, s/p PPM, came in for intermittent chest pain, at rest , not associated with any exertion or food intake. Se denies fever or chills, nausea, vomiting or abdominal pain.  She denies cough, sob, syncope, dizziness, headache, .  Currently she is chest pain free.  EKG shows sinus rhythm with prolonged Qtc.   Troponin are 25, 31, 30.  Labs are significant for potassium of 3.4, creatinine of 1.24.  US shows large leukocytes, rare bacteria.  CXR shows Small bilateral pleural effusions with atelectasis or infiltrate.    She was referred to [facility] for evaluation of chest pain.  Cardiology consulted for recommendations.   Hospital Course Patient was admitted to the hospital for atypical chest pain symptoms.  She was noted to have a UTI and hypokalemia that has been treated.  She also has been seen by the inpatient cardiology service.  A 2D echocardiogram was completed which shows relative improvement in LVEF compared to the prior study, now LVEF 45 to 50% which is mildly reduced. The patient's high-sensitivity troponin testing has been reassuring.  Cardiology felt that patient could discharge home today.  Outpatient follow-up as scheduled.  No changes to medications made.  Patient will complete 1  more dose of antibiotic to complete full treatment of UTI.  She has tolerated IV ceftriaxone in the hospital very well.  She is being discharged home in stable condition.  Discharge Diagnoses:  Principal Problem:   Chest pain Active Problems:   Essential hypertension   Hypothyroidism   Hypokalemia   UTI (urinary tract infection)   Systolic congestive heart failure (HCC)   Paroxysmal atrial fibrillation (HCC)   GERD (gastroesophageal reflux disease)   Discharge Instructions:  Allergies as of 04/14/2023       Reactions   Penicillins Hives   Has patient had a PCN reaction causing immediate rash, facial/tongue/throat swelling, SOB or lightheadedness with hypotension: Yes Has patient had a PCN reaction causing severe rash involving mucus membranes or skin necrosis: No Has patient had a PCN reaction that required hospitalization No Has patient had a PCN reaction occurring within the last 10 years: No If all of the above answers are "NO", then may proceed with Cephalosporin use.   Percocet [oxycodone-acetaminophen] Nausea And Vomiting        Medication List     TAKE these medications    acetaminophen 500 MG tablet Commonly known as: TYLENOL Take 1,000 mg by mouth every 8 (eight) hours as needed for headache, moderate pain or mild pain.   ALPRAZolam 0.25 MG tablet Commonly known as: XANAX Take 0.5-1 tablets (0.125-0.25 mg total) by mouth at bedtime as needed for sleep.   atorvastatin 80 MG tablet Commonly known as: LIPITOR TAKE ONE TABLET BY MOUTH EVERY DAY AT 6:00PM What changed: See the new instructions.   calcitRIOL 0.25 MCG capsule  Physician Discharge Summary  [name] WGN:[id number] DOB: 1945/09/10 DOA: 04/13/2023  PCP: Assunta Found, MD  Admit date: 04/13/2023 Discharge date: 04/14/2023  Admitted From:  Home  Disposition:  Home   Recommendations for Outpatient Follow-up:  Follow up with PCP in 1 weeks Follow up with cardiology as scheduled  Discharge Condition: STABLE   CODE STATUS: FULL DIET: resume previous home diet    Brief Hospitalization Summary: Please see all hospital notes, images, labs for full details of the hospitalization. Admission Provider HPI:  77 y.o. female with medical history significant of Atrial fibrillation , HFpEF , stage 3b CKD, CAD s/p PCI, hypertension, GERD, hypothyroidism, OSA, type 2 DM, PAD, s/p PPM, came in for intermittent chest pain, at rest , not associated with any exertion or food intake. Se denies fever or chills, nausea, vomiting or abdominal pain.  She denies cough, sob, syncope, dizziness, headache, .  Currently she is chest pain free.  EKG shows sinus rhythm with prolonged Qtc.   Troponin are 25, 31, 30.  Labs are significant for potassium of 3.4, creatinine of 1.24.  US shows large leukocytes, rare bacteria.  CXR shows Small bilateral pleural effusions with atelectasis or infiltrate.    She was referred to [facility] for evaluation of chest pain.  Cardiology consulted for recommendations.   Hospital Course Patient was admitted to the hospital for atypical chest pain symptoms.  She was noted to have a UTI and hypokalemia that has been treated.  She also has been seen by the inpatient cardiology service.  A 2D echocardiogram was completed which shows relative improvement in LVEF compared to the prior study, now LVEF 45 to 50% which is mildly reduced. The patient's high-sensitivity troponin testing has been reassuring.  Cardiology felt that patient could discharge home today.  Outpatient follow-up as scheduled.  No changes to medications made.  Patient will complete 1  more dose of antibiotic to complete full treatment of UTI.  She has tolerated IV ceftriaxone in the hospital very well.  She is being discharged home in stable condition.  Discharge Diagnoses:  Principal Problem:   Chest pain Active Problems:   Essential hypertension   Hypothyroidism   Hypokalemia   UTI (urinary tract infection)   Systolic congestive heart failure (HCC)   Paroxysmal atrial fibrillation (HCC)   GERD (gastroesophageal reflux disease)   Discharge Instructions:  Allergies as of 04/14/2023       Reactions   Penicillins Hives   Has patient had a PCN reaction causing immediate rash, facial/tongue/throat swelling, SOB or lightheadedness with hypotension: Yes Has patient had a PCN reaction causing severe rash involving mucus membranes or skin necrosis: No Has patient had a PCN reaction that required hospitalization No Has patient had a PCN reaction occurring within the last 10 years: No If all of the above answers are "NO", then may proceed with Cephalosporin use.   Percocet [oxycodone-acetaminophen] Nausea And Vomiting        Medication List     TAKE these medications    acetaminophen 500 MG tablet Commonly known as: TYLENOL Take 1,000 mg by mouth every 8 (eight) hours as needed for headache, moderate pain or mild pain.   ALPRAZolam 0.25 MG tablet Commonly known as: XANAX Take 0.5-1 tablets (0.125-0.25 mg total) by mouth at bedtime as needed for sleep.   atorvastatin 80 MG tablet Commonly known as: LIPITOR TAKE ONE TABLET BY MOUTH EVERY DAY AT 6:00PM What changed: See the new instructions.   calcitRIOL 0.25 MCG capsule  needed for headache, moderate pain or mild pain.   ALPRAZolam 0.25 MG tablet Commonly known as: XANAX Take 0.5-1 tablets (0.125-0.25 mg total) by mouth at bedtime as needed for sleep.   atorvastatin 80 MG tablet Commonly known as: LIPITOR TAKE ONE TABLET BY MOUTH EVERY DAY AT 6:00PM What changed: See the new instructions.   calcitRIOL 0.25 MCG capsule Commonly known as: ROCALTROL Take 0.25 mcg by mouth every Monday, Wednesday, and Friday.   carvedilol 25 MG tablet Commonly known as: COREG Take 1 tablet (25 mg total) by mouth 2 (two) times daily.   cefdinir 300 MG capsule Commonly known as: OMNICEF Take 1 capsule (300 mg total) by mouth daily for 2 days. Start taking on: April 15, 2023   Cholecalciferol 50 MCG (2000 UT) Tabs Take 4,000 Units by mouth at bedtime.   clopidogrel 75 MG tablet Commonly known as: PLAVIX TAKE ONE TABLET (75MG  TOTAL) BY MOUTH DAILY What changed:  See the new instructions.   Eliquis 5 MG Tabs tablet Generic drug: apixaban TAKE ONE TABLET BY MOUTH TWICE DAILY   FreeStyle Libre 2 Reader Devi 1 each by Does not apply route daily.   FreeStyle Libre 2 Sensor Misc USE ONE FOR FOURTEEN DAYS   FreeStyle Precision Neo Test test strip Generic drug: glucose blood Use as instructed   furosemide 40 MG tablet Commonly known as: LASIX Take 40 mg by mouth daily as needed for fluid or edema.   levothyroxine 112 MCG tablet Commonly known as: SYNTHROID TAKE ONE TABLET BY MOUTH ONCE DAILY   nitroGLYCERIN 0.4 MG SL tablet Commonly known as: NITROSTAT Place 1 tablet (0.4 mg total) under the tongue every 5 (five) minutes x 3 doses as needed for chest pain (If not relief after 3rd dose, call 911 or go to ED).   NovoLIN N FlexPen 100 UNIT/ML FlexPen Generic drug: Insulin NPH (Human) (Isophane) Inject 15 units subcutaneously in the morning & inject 4 units subcutaneously at night.   Ozempic (1 MG/DOSE) 4 MG/3ML Sopn Generic drug: Semaglutide (1 MG/DOSE) Inject 1 mg into the skin once a week.   rOPINIRole 0.5 MG tablet Commonly known as: REQUIP Take 1 tablet (0.5 mg total) by mouth at bedtime.        Procedures/Studies: ECHOCARDIOGRAM COMPLETE  Result Date: 04/14/2023    ECHOCARDIOGRAM REPORT   Patient Name:   Virginia Huber Date of Exam: 04/14/2023 Medical Rec #:  [id number]       Height:       63.0 in Accession #:    [id number]      Weight:       128.1 lb Date of Birth:  1945-09-23      BSA:          1.600 m Patient Age:    76 years        BP:           161/61 mmHg Patient Gender: F               HR:           62 bpm. Exam Location:  Inpatient Procedure: 2D Echo, Cardiac Doppler, Color Doppler and Intracardiac            Opacification Agent Indications:    Chest Pain R07.9  History:        Patient has prior history of Echocardiogram examinations, most                 recent 12/08/2022. HFpEF, CAD,  Physician Discharge Summary  [name] WGN:[id number] DOB: 1945/09/10 DOA: 04/13/2023  PCP: Assunta Found, MD  Admit date: 04/13/2023 Discharge date: 04/14/2023  Admitted From:  Home  Disposition:  Home   Recommendations for Outpatient Follow-up:  Follow up with PCP in 1 weeks Follow up with cardiology as scheduled  Discharge Condition: STABLE   CODE STATUS: FULL DIET: resume previous home diet    Brief Hospitalization Summary: Please see all hospital notes, images, labs for full details of the hospitalization. Admission Provider HPI:  77 y.o. female with medical history significant of Atrial fibrillation , HFpEF , stage 3b CKD, CAD s/p PCI, hypertension, GERD, hypothyroidism, OSA, type 2 DM, PAD, s/p PPM, came in for intermittent chest pain, at rest , not associated with any exertion or food intake. Se denies fever or chills, nausea, vomiting or abdominal pain.  She denies cough, sob, syncope, dizziness, headache, .  Currently she is chest pain free.  EKG shows sinus rhythm with prolonged Qtc.   Troponin are 25, 31, 30.  Labs are significant for potassium of 3.4, creatinine of 1.24.  US shows large leukocytes, rare bacteria.  CXR shows Small bilateral pleural effusions with atelectasis or infiltrate.    She was referred to [facility] for evaluation of chest pain.  Cardiology consulted for recommendations.   Hospital Course Patient was admitted to the hospital for atypical chest pain symptoms.  She was noted to have a UTI and hypokalemia that has been treated.  She also has been seen by the inpatient cardiology service.  A 2D echocardiogram was completed which shows relative improvement in LVEF compared to the prior study, now LVEF 45 to 50% which is mildly reduced. The patient's high-sensitivity troponin testing has been reassuring.  Cardiology felt that patient could discharge home today.  Outpatient follow-up as scheduled.  No changes to medications made.  Patient will complete 1  more dose of antibiotic to complete full treatment of UTI.  She has tolerated IV ceftriaxone in the hospital very well.  She is being discharged home in stable condition.  Discharge Diagnoses:  Principal Problem:   Chest pain Active Problems:   Essential hypertension   Hypothyroidism   Hypokalemia   UTI (urinary tract infection)   Systolic congestive heart failure (HCC)   Paroxysmal atrial fibrillation (HCC)   GERD (gastroesophageal reflux disease)   Discharge Instructions:  Allergies as of 04/14/2023       Reactions   Penicillins Hives   Has patient had a PCN reaction causing immediate rash, facial/tongue/throat swelling, SOB or lightheadedness with hypotension: Yes Has patient had a PCN reaction causing severe rash involving mucus membranes or skin necrosis: No Has patient had a PCN reaction that required hospitalization No Has patient had a PCN reaction occurring within the last 10 years: No If all of the above answers are "NO", then may proceed with Cephalosporin use.   Percocet [oxycodone-acetaminophen] Nausea And Vomiting        Medication List     TAKE these medications    acetaminophen 500 MG tablet Commonly known as: TYLENOL Take 1,000 mg by mouth every 8 (eight) hours as needed for headache, moderate pain or mild pain.   ALPRAZolam 0.25 MG tablet Commonly known as: XANAX Take 0.5-1 tablets (0.125-0.25 mg total) by mouth at bedtime as needed for sleep.   atorvastatin 80 MG tablet Commonly known as: LIPITOR TAKE ONE TABLET BY MOUTH EVERY DAY AT 6:00PM What changed: See the new instructions.   calcitRIOL 0.25 MCG capsule  needed for headache, moderate pain or mild pain.   ALPRAZolam 0.25 MG tablet Commonly known as: XANAX Take 0.5-1 tablets (0.125-0.25 mg total) by mouth at bedtime as needed for sleep.   atorvastatin 80 MG tablet Commonly known as: LIPITOR TAKE ONE TABLET BY MOUTH EVERY DAY AT 6:00PM What changed: See the new instructions.   calcitRIOL 0.25 MCG capsule Commonly known as: ROCALTROL Take 0.25 mcg by mouth every Monday, Wednesday, and Friday.   carvedilol 25 MG tablet Commonly known as: COREG Take 1 tablet (25 mg total) by mouth 2 (two) times daily.   cefdinir 300 MG capsule Commonly known as: OMNICEF Take 1 capsule (300 mg total) by mouth daily for 2 days. Start taking on: April 15, 2023   Cholecalciferol 50 MCG (2000 UT) Tabs Take 4,000 Units by mouth at bedtime.   clopidogrel 75 MG tablet Commonly known as: PLAVIX TAKE ONE TABLET (75MG  TOTAL) BY MOUTH DAILY What changed:  See the new instructions.   Eliquis 5 MG Tabs tablet Generic drug: apixaban TAKE ONE TABLET BY MOUTH TWICE DAILY   FreeStyle Libre 2 Reader Devi 1 each by Does not apply route daily.   FreeStyle Libre 2 Sensor Misc USE ONE FOR FOURTEEN DAYS   FreeStyle Precision Neo Test test strip Generic drug: glucose blood Use as instructed   furosemide 40 MG tablet Commonly known as: LASIX Take 40 mg by mouth daily as needed for fluid or edema.   levothyroxine 112 MCG tablet Commonly known as: SYNTHROID TAKE ONE TABLET BY MOUTH ONCE DAILY   nitroGLYCERIN 0.4 MG SL tablet Commonly known as: NITROSTAT Place 1 tablet (0.4 mg total) under the tongue every 5 (five) minutes x 3 doses as needed for chest pain (If not relief after 3rd dose, call 911 or go to ED).   NovoLIN N FlexPen 100 UNIT/ML FlexPen Generic drug: Insulin NPH (Human) (Isophane) Inject 15 units subcutaneously in the morning & inject 4 units subcutaneously at night.   Ozempic (1 MG/DOSE) 4 MG/3ML Sopn Generic drug: Semaglutide (1 MG/DOSE) Inject 1 mg into the skin once a week.   rOPINIRole 0.5 MG tablet Commonly known as: REQUIP Take 1 tablet (0.5 mg total) by mouth at bedtime.        Procedures/Studies: ECHOCARDIOGRAM COMPLETE  Result Date: 04/14/2023    ECHOCARDIOGRAM REPORT   Patient Name:   Virginia Huber Date of Exam: 04/14/2023 Medical Rec #:  [id number]       Height:       63.0 in Accession #:    [id number]      Weight:       128.1 lb Date of Birth:  1945-09-23      BSA:          1.600 m Patient Age:    76 years        BP:           161/61 mmHg Patient Gender: F               HR:           62 bpm. Exam Location:  Inpatient Procedure: 2D Echo, Cardiac Doppler, Color Doppler and Intracardiac            Opacification Agent Indications:    Chest Pain R07.9  History:        Patient has prior history of Echocardiogram examinations, most                 recent 12/08/2022. HFpEF, CAD,  Physician Discharge Summary  [name] WGN:[id number] DOB: 1945/09/10 DOA: 04/13/2023  PCP: Assunta Found, MD  Admit date: 04/13/2023 Discharge date: 04/14/2023  Admitted From:  Home  Disposition:  Home   Recommendations for Outpatient Follow-up:  Follow up with PCP in 1 weeks Follow up with cardiology as scheduled  Discharge Condition: STABLE   CODE STATUS: FULL DIET: resume previous home diet    Brief Hospitalization Summary: Please see all hospital notes, images, labs for full details of the hospitalization. Admission Provider HPI:  77 y.o. female with medical history significant of Atrial fibrillation , HFpEF , stage 3b CKD, CAD s/p PCI, hypertension, GERD, hypothyroidism, OSA, type 2 DM, PAD, s/p PPM, came in for intermittent chest pain, at rest , not associated with any exertion or food intake. Se denies fever or chills, nausea, vomiting or abdominal pain.  She denies cough, sob, syncope, dizziness, headache, .  Currently she is chest pain free.  EKG shows sinus rhythm with prolonged Qtc.   Troponin are 25, 31, 30.  Labs are significant for potassium of 3.4, creatinine of 1.24.  US shows large leukocytes, rare bacteria.  CXR shows Small bilateral pleural effusions with atelectasis or infiltrate.    She was referred to [facility] for evaluation of chest pain.  Cardiology consulted for recommendations.   Hospital Course Patient was admitted to the hospital for atypical chest pain symptoms.  She was noted to have a UTI and hypokalemia that has been treated.  She also has been seen by the inpatient cardiology service.  A 2D echocardiogram was completed which shows relative improvement in LVEF compared to the prior study, now LVEF 45 to 50% which is mildly reduced. The patient's high-sensitivity troponin testing has been reassuring.  Cardiology felt that patient could discharge home today.  Outpatient follow-up as scheduled.  No changes to medications made.  Patient will complete 1  more dose of antibiotic to complete full treatment of UTI.  She has tolerated IV ceftriaxone in the hospital very well.  She is being discharged home in stable condition.  Discharge Diagnoses:  Principal Problem:   Chest pain Active Problems:   Essential hypertension   Hypothyroidism   Hypokalemia   UTI (urinary tract infection)   Systolic congestive heart failure (HCC)   Paroxysmal atrial fibrillation (HCC)   GERD (gastroesophageal reflux disease)   Discharge Instructions:  Allergies as of 04/14/2023       Reactions   Penicillins Hives   Has patient had a PCN reaction causing immediate rash, facial/tongue/throat swelling, SOB or lightheadedness with hypotension: Yes Has patient had a PCN reaction causing severe rash involving mucus membranes or skin necrosis: No Has patient had a PCN reaction that required hospitalization No Has patient had a PCN reaction occurring within the last 10 years: No If all of the above answers are "NO", then may proceed with Cephalosporin use.   Percocet [oxycodone-acetaminophen] Nausea And Vomiting        Medication List     TAKE these medications    acetaminophen 500 MG tablet Commonly known as: TYLENOL Take 1,000 mg by mouth every 8 (eight) hours as needed for headache, moderate pain or mild pain.   ALPRAZolam 0.25 MG tablet Commonly known as: XANAX Take 0.5-1 tablets (0.125-0.25 mg total) by mouth at bedtime as needed for sleep.   atorvastatin 80 MG tablet Commonly known as: LIPITOR TAKE ONE TABLET BY MOUTH EVERY DAY AT 6:00PM What changed: See the new instructions.   calcitRIOL 0.25 MCG capsule

## 2023-04-16 ENCOUNTER — Ambulatory Visit: Payer: PPO | Admitting: Podiatry

## 2023-04-22 DIAGNOSIS — I482 Chronic atrial fibrillation, unspecified: Secondary | ICD-10-CM | POA: Diagnosis not present

## 2023-04-22 DIAGNOSIS — D6869 Other thrombophilia: Secondary | ICD-10-CM | POA: Diagnosis not present

## 2023-04-22 DIAGNOSIS — I130 Hypertensive heart and chronic kidney disease with heart failure and stage 1 through stage 4 chronic kidney disease, or unspecified chronic kidney disease: Secondary | ICD-10-CM | POA: Diagnosis not present

## 2023-04-22 DIAGNOSIS — I10 Essential (primary) hypertension: Secondary | ICD-10-CM | POA: Diagnosis not present

## 2023-04-26 DIAGNOSIS — N390 Urinary tract infection, site not specified: Secondary | ICD-10-CM | POA: Diagnosis not present

## 2023-04-26 DIAGNOSIS — I482 Chronic atrial fibrillation, unspecified: Secondary | ICD-10-CM | POA: Diagnosis not present

## 2023-04-26 DIAGNOSIS — Z6823 Body mass index (BMI) 23.0-23.9, adult: Secondary | ICD-10-CM | POA: Diagnosis not present

## 2023-04-27 ENCOUNTER — Ambulatory Visit (HOSPITAL_COMMUNITY)
Admission: RE | Admit: 2023-04-27 | Discharge: 2023-04-27 | Disposition: A | Discharge status: Home / Self Care | Payer: PPO | Source: Ambulatory Visit | Attending: Neurology | Admitting: Neurology

## 2023-04-27 DIAGNOSIS — M5021 Other cervical disc displacement,  high cervical region: Secondary | ICD-10-CM | POA: Diagnosis not present

## 2023-04-27 DIAGNOSIS — G620 Drug-induced polyneuropathy: Secondary | ICD-10-CM | POA: Insufficient documentation

## 2023-04-27 DIAGNOSIS — G4733 Obstructive sleep apnea (adult) (pediatric): Secondary | ICD-10-CM | POA: Diagnosis not present

## 2023-04-27 DIAGNOSIS — M5023 Other cervical disc displacement, cervicothoracic region: Secondary | ICD-10-CM | POA: Diagnosis not present

## 2023-04-27 DIAGNOSIS — N1832 Chronic kidney disease, stage 3b: Secondary | ICD-10-CM | POA: Diagnosis not present

## 2023-04-27 DIAGNOSIS — E1142 Type 2 diabetes mellitus with diabetic polyneuropathy: Secondary | ICD-10-CM | POA: Diagnosis not present

## 2023-04-27 DIAGNOSIS — I480 Paroxysmal atrial fibrillation: Secondary | ICD-10-CM | POA: Diagnosis not present

## 2023-04-27 DIAGNOSIS — G251 Drug-induced tremor: Secondary | ICD-10-CM | POA: Insufficient documentation

## 2023-04-27 DIAGNOSIS — T462X5A Adverse effect of other antidysrhythmic drugs, initial encounter: Secondary | ICD-10-CM | POA: Insufficient documentation

## 2023-04-27 DIAGNOSIS — I6782 Cerebral ischemia: Secondary | ICD-10-CM | POA: Diagnosis not present

## 2023-04-27 DIAGNOSIS — I5030 Unspecified diastolic (congestive) heart failure: Secondary | ICD-10-CM | POA: Diagnosis not present

## 2023-04-27 DIAGNOSIS — M47812 Spondylosis without myelopathy or radiculopathy, cervical region: Secondary | ICD-10-CM | POA: Diagnosis not present

## 2023-04-28 NOTE — Progress Notes (Signed)
Remote pacemaker transmission.   

## 2023-04-29 DIAGNOSIS — N189 Chronic kidney disease, unspecified: Secondary | ICD-10-CM | POA: Diagnosis not present

## 2023-04-29 DIAGNOSIS — E1122 Type 2 diabetes mellitus with diabetic chronic kidney disease: Secondary | ICD-10-CM | POA: Diagnosis not present

## 2023-04-29 DIAGNOSIS — E1129 Type 2 diabetes mellitus with other diabetic kidney complication: Secondary | ICD-10-CM | POA: Diagnosis not present

## 2023-04-29 DIAGNOSIS — I5032 Chronic diastolic (congestive) heart failure: Secondary | ICD-10-CM | POA: Diagnosis not present

## 2023-04-29 DIAGNOSIS — N2581 Secondary hyperparathyroidism of renal origin: Secondary | ICD-10-CM | POA: Diagnosis not present

## 2023-04-29 DIAGNOSIS — N1832 Chronic kidney disease, stage 3b: Secondary | ICD-10-CM | POA: Diagnosis not present

## 2023-04-30 DIAGNOSIS — G4733 Obstructive sleep apnea (adult) (pediatric): Secondary | ICD-10-CM | POA: Diagnosis not present

## 2023-05-03 ENCOUNTER — Encounter: Payer: Self-pay | Admitting: Neurology

## 2023-05-05 ENCOUNTER — Telehealth: Payer: Self-pay | Admitting: Family Medicine

## 2023-05-05 NOTE — Telephone Encounter (Signed)
Left message for patient to call.

## 2023-05-05 NOTE — Telephone Encounter (Signed)
Please call Virginia Huber and let her know that we should reschedule the visit she has with me 11/18 out until after she has MRI results and NCS/EMG results back. Dr Dohmeier recommended 3-4 months with her or me at last visit 03/2023. We will need to see results of workup prior to making any adjustments in care plan.

## 2023-05-05 NOTE — Progress Notes (Deleted)
PATIENT: Virginia Huber DOB: 1945-11-01  REASON FOR VISIT: follow up HISTORY FROM: patient  No chief complaint on file.     HISTORY OF PRESENT ILLNESS:  05/05/23 ALL:   05/14/2022 ALL: [name] returns for follow up for OSA on CPAP. She is doing well on therapy. She does not like to use her machine but understands medical need. She does not feel any better when using CPAP. If she travels she does not take it with her and feels that she sleeps well.   Bilateral hand tremor seems to be worsening. She is scheduled for an ablation in 07/2022 and hoping to discontinue amiodarone. She is hoping tremor improves.     05/06/2021 ALL: Virginia Huber is a 77 y.o. female here today for follow up for OSA on CPAP. She is doing well on therapy. She does feel more refreshed when using CPAP. She continues to follow closely with cardiology and ophthalmology. She is s/p eye surgery and was unable to use CPAP for about 2 weeks in October. Tremor is stable. Seems to be worse with activity. Both hands effected. Neuropathy is stable. She is followed closely by PCP.      HISTORY: (copied from Dr Dohmeier's previous note)  HPI:  Virginia Huber is a 77 y.o. female patient , seen in a RV after sleep study and for CPAP compliance. CC: Loud snoring, dependent odema, atrial fib, CHF, witnessed apnea, fatigue, TIA.  She reports her family is doing well, nobody has fallen sick during this pandemic. Her grandson got infected as an EMT, recovered well. Her short term memory has been slowly declining and she has trouble to fall asleep and stay asleep. Her sleep study was performed elsewhere on 04-12-2016. She has been doing exquisitely well with her CPAP use 97% compliance by time and by days with an average of 8 hours and 12 minutes her CPAP is set for 10 cm pressure with 3 cm EPR and is now 77 years old.  Her residual AHI is low at 1.4 apneas per hour and she has very minimal air leakage.  This means that her mask  fits well and that her apnea is well controlled I would not have to do any adjustments to that.  Any new mask or supplies go through her DME [employer].  The patient also endorsed only 2 points on the Epworth Sleepiness Scale at 18 points on the fatigue severity scale, and 1 point out of 15 on the geriatric depression score.   She is fully vaccinated against Covid.  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  She was taken recently of Metformin for at the beginning of the year and since then had actually more trouble controlling her blood sugar and her blood pressure today.  The step to discontinue Metformin was meant to put protect her kidney function. She is finally seen by Romero Belling, MD - endocrinologist.     The patient continues to be a highly compliant CPAP user she has used her machine 28 over the last 30 days ending on 05 February 2019.  93% compliance for time and days with an average user time of 7 hours 27 minutes, she usually sleeps 8 hours or uses the machine 8 hours at night.  The pressure is set at 10 cmH2O her machine is provided by [employer].  She has an expiratory pressure relief setting of 3 cm and uses she has been using a full facemask with very little air  leakage the 95th percentile is 1.2 L/min and a residual AHI is 1.0.  However she reports difficulties with falling asleep, staying asleep and with the mask fit by now.  She feels that she would be ready for change maybe trying a nasal mask or nasal cradle. Sometimes she drools at night.  Trazodone- 25 mg , a half tablet, she can increase to a full if needed. I will order a nasal mask, such as the wisp.      REVIEW OF SYSTEMS: Out of a complete 14 system review of symptoms, the patient complains only of the following symptoms, neuropathy, tremor, and all other reviewed systems are negative.  ESS: 3/24  ALLERGIES: Allergies  Allergen Reactions   Penicillins Hives    Has patient had a PCN  reaction causing immediate rash, facial/tongue/throat swelling, SOB or lightheadedness with hypotension: Yes Has patient had a PCN reaction causing severe rash involving mucus membranes or skin necrosis: No Has patient had a PCN reaction that required hospitalization No Has patient had a PCN reaction occurring within the last 10 years: No If all of the above answers are "NO", then may proceed with Cephalosporin use.   Percocet [Oxycodone-Acetaminophen] Nausea And Vomiting    HOME MEDICATIONS: Outpatient Medications Prior to Visit  Medication Sig Dispense Refill   acetaminophen (TYLENOL) 500 MG tablet Take 1,000 mg by mouth every 8 (eight) hours as needed for headache, moderate pain or mild pain.     ALPRAZolam (XANAX) 0.25 MG tablet Take 0.5-1 tablets (0.125-0.25 mg total) by mouth at bedtime as needed for sleep. 30 tablet 0   atorvastatin (LIPITOR) 80 MG tablet TAKE ONE TABLET BY MOUTH EVERY DAY AT 6:00PM (Patient taking differently: Take 80 mg by mouth daily.) 90 tablet 3   calcitRIOL (ROCALTROL) 0.25 MCG capsule Take 0.25 mcg by mouth every Monday, Wednesday, and Friday.     carvedilol (COREG) 25 MG tablet Take 1 tablet (25 mg total) by mouth 2 (two) times daily. 60 tablet 3   Cholecalciferol 50 MCG (2000 UT) TABS Take 4,000 Units by mouth at bedtime.     clopidogrel (PLAVIX) 75 MG tablet TAKE ONE TABLET (75MG  TOTAL) BY MOUTH DAILY (Patient taking differently: Take 75 mg by mouth daily.) 90 tablet 3   Continuous Blood Gluc Receiver (FREESTYLE LIBRE 2 READER) DEVI 1 each by Does not apply route daily. 1 each 0   Continuous Glucose Sensor (FREESTYLE LIBRE 2 SENSOR) MISC USE ONE FOR FOURTEEN DAYS 6 each 3   ELIQUIS 5 MG TABS tablet TAKE ONE TABLET BY MOUTH TWICE DAILY 60 tablet 5   furosemide (LASIX) 40 MG tablet Take 40 mg by mouth daily as needed for fluid or edema.     glucose blood (FREESTYLE PRECISION NEO TEST) test strip Use as instructed 100 each 3   Insulin NPH, Human,, Isophane,  (NOVOLIN N FLEXPEN) 100 UNIT/ML Kiwkpen Inject 15 units subcutaneously in the morning & inject 4 units subcutaneously at night.     levothyroxine (SYNTHROID) 112 MCG tablet TAKE ONE TABLET BY MOUTH ONCE DAILY 90 tablet 2   nitroGLYCERIN (NITROSTAT) 0.4 MG SL tablet Place 1 tablet (0.4 mg total) under the tongue every 5 (five) minutes x 3 doses as needed for chest pain (If not relief after 3rd dose, call 911 or go to ED). 25 tablet 0   rOPINIRole (REQUIP) 0.5 MG tablet Take 1 tablet (0.5 mg total) by mouth at bedtime. 30 tablet 5   Semaglutide, 1 MG/DOSE, (OZEMPIC, 1 MG/DOSE,) 4 MG/3ML  SOPN Inject 1 mg into the skin once a week. 3 mL 2   No facility-administered medications prior to visit.    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 h/o PAF/flutter during admission in 2013 for PNA. b. PAF during adm for NSTEMI 07/2015, subsequent paroxysms since then.   B12 deficiency anemia    Branch retinal vein occlusion with macular edema of right eye 10/11/2019   Chronic renal failure, stage 3b (HCC) 09/23/2017   Coronary artery disease 11/30/2014   a. remote MI. b. h/o PTCA with scoring balloon to OM1 11/2014. c. NSTEMI 03/2015 s/p DES to prox-mid Cx. d. NSTEMI 07/2015 s/p scoring balloon/PTCA/DES to dRCA with PAF during that admission   Cutaneous lupus erythematosus    Early stage nonexudative age-related macular degeneration of right eye 02/27/2020   Essential hypertension    GERD (gastroesophageal reflux disease)    History of blood transfusion 1980's   2nd surgical procedures   Hypercholesteremia    Hypothyroidism    Myocardial infarction (HCC) 02/2012   NSTEMI (non-ST  elevated myocardial infarction) (HCC) 04/02/2015   OSA (obstructive sleep apnea) 05/13/2016   Ovarian tumor    PAD (peripheral artery disease) (HCC)    a. s/p LE angio 2015; followed by Dr. Kirke Corin - managed medically.   Pericardial effusion    a. 06/2016 after ppm - s/p pericardiocentesis.   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 s/p Medtronic PPM 06/2016, c/b lead perf/pericardial effusion.   TIA (transient ischemic attack)    Type 2 diabetes with nephropathy (HCC) 02/29/2012    PAST SURGICAL HISTORY: Past Surgical History:  Procedure Laterality Date   A-FLUTTER ABLATION N/A 12/08/2022   Procedure: A-FLUTTER ABLATION;  Surgeon: Regan Lemming, MD;  Location: [facility];  Service: Cardiovascular;  Laterality: N/A;   ABDOMINAL AORTAGRAM N/A 01/03/2014   Procedure: ABDOMINAL Ronny Flurry;  Surgeon: Iran Ouch, MD;  Location: [facility] CATH [facility];  Service: Cardiovascular;  Laterality: N/A;   ABDOMINAL HYSTERECTOMY  06/22/1970   "partial"   APPENDECTOMY  02/20/1969   ATRIAL FIBRILLATION ABLATION N/A 08/14/2022   Procedure: ATRIAL FIBRILLATION ABLATION;  Surgeon: Regan Lemming, MD;  Location: [facility];  Service: Cardiovascular;  Laterality: N/A;   ATRIAL FIBRILLATION ABLATION N/A 12/08/2022   Procedure: ATRIAL FIBRILLATION ABLATION;  Surgeon: Regan Lemming, MD;  Location: [facility];  Service: Cardiovascular;  Laterality: N/A;   CARDIAC CATHETERIZATION  06/22/2006   Tiny OM-2 with 90% narrowing. Med tx.   CARDIAC CATHETERIZATION N/A 11/30/2014   Procedure: Left Heart Cath and Coronary  Angiography;  Surgeon: Lennette Bihari, MD; LAD 20%, CFX 50%, OM1 95%, right PLB 30%, LV normal    CARDIAC CATHETERIZATION N/A 11/30/2014   Procedure: Coronary Balloon Angioplasty;  Surgeon: Lennette Bihari, MD;  Angiosculpt scoring balloon and  PTCA to the OM1 reducing stenosis from 95% to less than 10%   CARDIAC CATHETERIZATION N/A 04/03/2015   Procedure: Left Heart Cath and Coronary Angiography;  Surgeon: Dolores Patty, MD; dLAD 50%, CFX 90%, OM1 100%, PLA 15%, LVEDP 13     CARDIAC CATHETERIZATION N/A 04/03/2015   Procedure: Coronary Stent Intervention;  Surgeon: Tonny Bollman, MD; 3.0x18 mm Xience DES to the CFX     CARDIAC CATHETERIZATION N/A 08/02/2015   Procedure: Left Heart Cath and Coronary Angiography;  Surgeon: Lennette Bihari, MD;  [facility] INVASIVE CV LAB;  Service: Cardiovascular;  Laterality: N/A;   CARDIAC CATHETERIZATION N/A 08/02/2015   Procedure: Coronary Stent Intervention;  Surgeon: Lennette Bihari, MD;  Location: [facility];  Service: Cardiovascular;  Laterality: N/A;   CARDIAC CATHETERIZATION N/A 06/25/2016   Procedure: Pericardiocentesis;  Surgeon: Will Jorja Loa, MD;  Location: [facility];  Service: Cardiovascular;  Laterality: N/A;   cardiac stents     CARDIOVERSION N/A 12/15/2017   Procedure: CARDIOVERSION;  Surgeon: Elease Hashimoto Deloris Ping, MD;  Location: [facility] ENDOSCOPY;  Service: Cardiovascular;  Laterality: N/A;   CARDIOVERSION N/A 05/12/2022   Procedure: CARDIOVERSION;  Surgeon: Quintella Reichert, MD;  Location: [facility] ENDOSCOPY;  Service: Cardiovascular;  Laterality: N/A;   CARDIOVERSION N/A 09/30/2022   Procedure: CARDIOVERSION;  Surgeon: Thurmon Fair, MD;  Location: [facility];  Service: Cardiovascular;  Laterality: N/A;   CARDIOVERSION N/A 10/29/2022   Procedure: CARDIOVERSION;  Surgeon: Quintella Reichert, MD;  [facility]: [facility];  Service: Cardiovascular;  Laterality: N/A;   CHOLECYSTECTOMY OPEN  02/20/1989   COLONOSCOPY   06/23/2003   Dr. Karilyn Cota: pancolonic divericula, polyp, path unknown currently   COLONOSCOPY  06/22/2010   Dr. Darrick Penna: Normal TI, scattered diverticula in entire colon, small internal hemorrhoids, normal colon biopsies. Colonoscopy in 5-10 years.    COLONOSCOPY WITH PROPOFOL N/A 06/02/2021   pancolonic diverticulosis. Two 4-6 mm polyps in transverse colon. Sessile serrated and hyperplastic. 5 year surveillance if benefits outweigh the risks.   COLOSTOMY  05/23/1979   COLOSTOMY REVERSAL  11/21/1979   EP IMPLANTABLE DEVICE N/A 06/25/2016   Procedure: Lead Revision/Repair;  Surgeon: Will Jorja Loa, MD;  Location: [facility];  Service: Cardiovascular;  Laterality: N/A;   EP IMPLANTABLE DEVICE N/A 06/25/2016   Procedure: Pacemaker Implant;  Surgeon: Will Jorja Loa, MD;  Location: [facility];  Service: Cardiovascular;  Laterality: N/A;   EXCISIONAL HEMORRHOIDECTOMY  02/20/1969   EYE SURGERY Left 06/22/1998   "branch vein occlusion"   EYE SURGERY Left ~ 2001   "smoothed out wrinkle"   LEFT HEART CATH AND CORONARY ANGIOGRAPHY N/A 05/11/2022   Procedure: LEFT HEART CATH AND CORONARY ANGIOGRAPHY;  Surgeon: Kathleene Hazel, MD;  Location: [facility];  Service: Cardiovascular;  Laterality: N/A;   LEFT OOPHORECTOMY  05/23/1979   nicked bowel, peritonitis, colostomy; colostomy reversed 1981    LOWER EXTREMITY ANGIOGRAM N/A 01/03/2014   Procedure: LOWER EXTREMITY ANGIOGRAM;  Surgeon: Iran Ouch, MD;  Location: [facility] CATH [facility];  Service: Cardiovascular;  Laterality: N/A;   Nuclear med stress test  10/21/2011   Small area of mild ischemia inferoapically.   PARTIAL HYSTERECTOMY  02/20/1969   left ovaries, then ovaries removed later due tumors    POLYPECTOMY  06/02/2021   Procedure: POLYPECTOMY;  Surgeon: Lanelle Bal, DO;  Location: [facility];  Service: Endoscopy;;   right eye surgery  03/2021   RIGHT OOPHORECTOMY  02/20/1969   TEE WITHOUT  CARDIOVERSION N/A 05/12/2022   Procedure: TRANSESOPHAGEAL ECHOCARDIOGRAM (TEE);  Surgeon: Quintella Reichert, MD;  Location: [facility] ENDOSCOPY;  Service: Cardiovascular;  Laterality: N/A;   TEE WITHOUT CARDIOVERSION N/A 08/14/2022   Procedure: TRANSESOPHAGEAL ECHOCARDIOGRAM;  Surgeon: Regan Lemming, MD;  Location: [facility] INVASIVE CV LAB;  Service: Cardiovascular;  Laterality: N/A;   TEE WITHOUT CARDIOVERSION N/A 12/08/2022   Procedure: TRANSESOPHAGEAL ECHOCARDIOGRAM;  Surgeon: Regan Lemming, MD;  Location: [facility] INVASIVE CV LAB;  Service: Cardiovascular;  Laterality: N/A;    FAMILY HISTORY: Family History  Problem Relation Age of Onset   Heart disease Mother        deceased   Heart disease Father        deceased, heart disease   Diabetes Brother    Heart disease Brother    Thyroid disease Brother    Heart disease Sister    Heart disease Brother    Thyroid disease Brother    Lupus Daughter    Colon cancer Neg Hx     SOCIAL HISTORY: Social History   Socioeconomic History   Marital status: Married    Spouse name: Not on file   Number of children: Not on file   Years of education: Not on file   Highest education level: Not on file  Occupational History   Occupation: Retired    Associate Professor: RETIRED    Comment: insurance billing  Tobacco Use   Smoking status: Never    Passive exposure: Never   Smokeless tobacco: Never   Tobacco comments:    Never smoke 10/08/22  Vaping Use   Vaping status: Never Used  Substance and Sexual Activity   Alcohol use: No    Alcohol/week: 0.0 standard drinks of alcohol   Drug use: No   Sexual activity: Never    Birth control/protection: Surgical    Comment: hyst  Other Topics Concern   Not on file  Social History Narrative   Not on file   Social Determinants of Health   Financial Resource Strain: Not on file  Food Insecurity: No Food Insecurity (04/13/2023)   Hunger Vital Sign    Worried About Running Out of Food in the Last Year: Never  true    Ran Out of Food in the Last Year: Never true  Transportation Needs: No Transportation Needs (04/13/2023)   PRAPARE - Administrator, Civil Service (Medical): No    Lack of Transportation (Non-Medical): No  Physical Activity: Not on file  Stress: Not on file  Social Connections: Not on file  Intimate Partner Violence: Not At Risk (04/13/2023)   Humiliation, Afraid, Rape, and Kick questionnaire    Fear of Current or Ex-Partner: No    Emotionally Abused: No    Physically Abused: No    Sexually Abused: No     PHYSICAL EXAM  There were no vitals filed for this visit.   There is no height or weight on file to calculate BMI.  Generalized: Well developed, in no acute distress  Cardiology: normal rate and rhythm, no murmur noted Respiratory: clear to auscultation bilaterally  Neurological examination  Mentation: Alert oriented to time, place, history taking. Follows all commands speech and language fluent Cranial nerve II-XII: Pupils were equal round reactive to light. Extraocular movements were full, visual field were full  Motor: The motor testing reveals 5 over 5 strength of all 4 extremities. Good symmetric motor tone is noted throughout. No tremor noted  Gait and station: Gait  is normal.    DIAGNOSTIC DATA (LABS, IMAGING, TESTING) - I reviewed patient records, labs, notes, testing and imaging myself where available.      No data to display           Lab Results  Component Value Date   WBC 8.9 04/13/2023   HGB 12.0 04/13/2023   HCT 37.0 04/13/2023   MCV 94.6 04/13/2023   PLT 200 04/13/2023      Component Value Date/Time   NA 135 04/14/2023 0419   NA 143 03/23/2023 1135   K 4.8 04/14/2023 0419   CL 104 04/14/2023 0419   CO2 23 04/14/2023 0419   GLUCOSE 183 (H) 04/14/2023 0419   BUN 24 (H) 04/14/2023 0419   BUN 19 03/23/2023 1135   CREATININE 1.31 (H) 04/14/2023 0419   CREATININE 1.11 (H) 12/31/2017 0850   CALCIUM 8.4 (L) 04/14/2023 0419    PROT 6.1 03/23/2023 1135   ALBUMIN 4.1 03/23/2023 1135   AST 22 03/23/2023 1135   ALT 22 03/23/2023 1135   ALKPHOS 89 03/23/2023 1135   BILITOT 0.6 03/23/2023 1135   GFRNONAA 42 (L) 04/14/2023 0419   GFRNONAA 43 (L) 08/05/2017 0807   GFRAA 39 (L) 08/26/2019 0901   GFRAA 50 (L) 08/05/2017 0807   Lab Results  Component Value Date   CHOL 96 05/10/2022   HDL 30 (L) 05/10/2022   LDLCALC 35 05/10/2022   TRIG 156 (H) 05/10/2022   CHOLHDL 3.2 05/10/2022   Lab Results  Component Value Date   HGBA1C 6.4 (A) 02/01/2023   Lab Results  Component Value Date   VITAMINB12 401 03/23/2023   Lab Results  Component Value Date   TSH 1.170 03/23/2023     ASSESSMENT AND PLAN 77 y.o. year old female  has a past medical history of (HFpEF) heart failure with preserved ejection fraction (HCC), Advanced nonexudative age-related macular degeneration of left eye with subfoveal involvement (06/26/2020), Amiodarone induced neuropathy (HCC) (12/08/2017), Arthritis, Atrial fibrillation and flutter (HCC), B12 deficiency anemia, Branch retinal vein occlusion with macular edema of right eye (10/11/2019), Chronic renal failure, stage 3b (HCC) (09/23/2017), Coronary artery disease (11/30/2014), Cutaneous lupus erythematosus, Early stage nonexudative age-related macular degeneration of right eye (02/27/2020), Essential hypertension, GERD (gastroesophageal reflux disease), History of blood transfusion (1980's), Hypercholesteremia, Hypothyroidism, Myocardial infarction (HCC) (02/2012), NSTEMI (non-ST elevated myocardial infarction) (HCC) (04/02/2015), OSA (obstructive sleep apnea) (05/13/2016), Ovarian tumor, PAD (peripheral artery disease) (HCC), Pericardial effusion, Posterior vitreous detachment of right eye (10/11/2019), Posterior vitreous detachment of right eye (10/11/2019), Presence of permanent cardiac pacemaker, Retinal microaneurysm of right eye (10/11/2019), S/P pericardiocentesis (06/28/2016), Secondary  parkinsonism due to other external agents (HCC) (12/08/2017), Stable central retinal vein occlusion of left eye (05/13/2020), Tachy-brady syndrome (HCC), TIA (transient ischemic attack), and Type 2 diabetes with nephropathy (HCC) (02/29/2012). here with   No diagnosis found.    Virginia Huber is doing well on CPAP therapy. Compliance report reveals acceptable compliance. She was encouraged to continue using CPAP nightly and for greater than 4 hours each night. We will update supply orders as indicated. I will update HST and order new CPAP pending results. Risks of untreated sleep apnea review and education materials provided. She will continue to follow up with care team. Tremor may be a little worse. She will follow up if no improvement off amiodarone. Healthy lifestyle habits encouraged. She will follow up in 31-90 days following set up of new CPAP. She verbalizes understanding and agreement with this plan.    No  orders of the defined types were placed in this encounter.     No orders of the defined types were placed in this encounter.      Shawnie Dapper, FNP-C 05/05/2023, 4:36 PM [facility] [address], [address] [place], [place] [address] [phone]

## 2023-05-06 NOTE — Telephone Encounter (Signed)
Left detailed message on voicemail with the below, appointment canceled.

## 2023-05-07 ENCOUNTER — Ambulatory Visit: Payer: PPO | Admitting: Podiatry

## 2023-05-10 ENCOUNTER — Ambulatory Visit: Payer: PPO | Admitting: Family Medicine

## 2023-05-11 DIAGNOSIS — H35041 Retinal micro-aneurysms, unspecified, right eye: Secondary | ICD-10-CM | POA: Diagnosis not present

## 2023-05-11 DIAGNOSIS — H353124 Nonexudative age-related macular degeneration, left eye, advanced atrophic with subfoveal involvement: Secondary | ICD-10-CM | POA: Diagnosis not present

## 2023-05-11 DIAGNOSIS — H353111 Nonexudative age-related macular degeneration, right eye, early dry stage: Secondary | ICD-10-CM | POA: Diagnosis not present

## 2023-05-11 DIAGNOSIS — E113411 Type 2 diabetes mellitus with severe nonproliferative diabetic retinopathy with macular edema, right eye: Secondary | ICD-10-CM | POA: Diagnosis not present

## 2023-05-11 DIAGNOSIS — H34831 Tributary (branch) retinal vein occlusion, right eye, with macular edema: Secondary | ICD-10-CM | POA: Diagnosis not present

## 2023-05-11 LAB — HM DIABETES EYE EXAM

## 2023-05-12 ENCOUNTER — Ambulatory Visit: Payer: Self-pay | Admitting: Neurology

## 2023-05-12 ENCOUNTER — Ambulatory Visit (INDEPENDENT_AMBULATORY_CARE_PROVIDER_SITE_OTHER): Payer: PPO | Admitting: Neurology

## 2023-05-12 VITALS — BP 168/97 | HR 78 | Ht 63.0 in

## 2023-05-12 DIAGNOSIS — I480 Paroxysmal atrial fibrillation: Secondary | ICD-10-CM

## 2023-05-12 DIAGNOSIS — G620 Drug-induced polyneuropathy: Secondary | ICD-10-CM

## 2023-05-12 DIAGNOSIS — N1832 Chronic kidney disease, stage 3b: Secondary | ICD-10-CM | POA: Diagnosis not present

## 2023-05-12 DIAGNOSIS — G251 Drug-induced tremor: Secondary | ICD-10-CM

## 2023-05-12 DIAGNOSIS — I5030 Unspecified diastolic (congestive) heart failure: Secondary | ICD-10-CM

## 2023-05-12 DIAGNOSIS — Z794 Long term (current) use of insulin: Secondary | ICD-10-CM | POA: Diagnosis not present

## 2023-05-12 DIAGNOSIS — E1142 Type 2 diabetes mellitus with diabetic polyneuropathy: Secondary | ICD-10-CM

## 2023-05-12 DIAGNOSIS — Z0289 Encounter for other administrative examinations: Secondary | ICD-10-CM

## 2023-05-12 NOTE — Procedures (Signed)
Full Name: Crystalina Avallone Gender: Female MRN #: [id number] Date of Birth: [birth year]    Visit Date: 05/12/2023 10:39 Age: 77 Years Examining Physician: Dr. Levert Feinstein Referring Physician: Dr. Porfirio Mylar Dohmeier Height: 5 feet 3 inch History: 77 year old female presenting with 2 years history of gradual onset unsteady gait, long history of gradual worsening bilateral feet paresthesia, also have chronic low back pain  Summary of the test: Nerve conduction study: Bilateral sural, superficial peroneal sensory responses were absent.  Bilateral tibial motor responses showed mildly decreased CMAP amplitude, right peroneal to EDB motor response showed moderately decreased CMAP amplitude, with moderate slowed conduction velocity.  Left peroneal to EDB motor response was absent.  Left ulnar sensory response was normal.  Left ulnar and median motor responses showed mild to moderately decreased CMAP amplitude, with mildly prolonged distal latency  Electromyography: Selected needle examination was performed at bilateral lower extremity muscles, and lumbosacral paraspinal muscles.  There is evidence of mild chronic neuropathic changes involving bilateral L4-5 S1 myotomes, right worse than left.  There is no evidence of active denervation at bilateral lumbosacral paraspinal muscles.   Conclusion: This is an abnormal study.  There is electrodiagnostic evidence of length-dependent mild axonal sensorimotor polyneuropathy.    ------------------------------- Levert Feinstein M.D. Ph.D.  [facility] [address] [place], [place] [address] Tel: [phone] Fax: [phone]  Verbal informed consent was obtained from the patient, patient was informed of potential risk of procedure, including bruising, bleeding, hematoma formation, infection, muscle weakness, muscle pain, numbness, among others.        MNC    Nerve / Sites Muscle Latency Ref. Amplitude Ref. Rel Amp  Segments Distance Velocity Ref. Area    ms ms mV mV %  cm m/s m/s mVms  L Median - APB     Wrist APB 3.5 <=4.4 3.1 >=4.0 100 Wrist - APB 7   17.2     Upper arm APB 8.4  2.9  93.5 Upper arm - Wrist 25 52 >=49 15.8  L Ulnar - ADM     Wrist ADM 4.1 <=3.3 2.9 >=6.0 100 Wrist - ADM 7   12.3     B.Elbow ADM 5.2  2.8  96.8 B.Elbow - Wrist 8 72 >=49 12.5     A.Elbow ADM 8.9  2.5  90.5 A.Elbow - B.Elbow 19 52 >=49 9.6  L Peroneal - EDB     Ankle EDB NR <=6.5 NR >=2.0 NR Ankle - EDB 9   NR         Pop fossa - Ankle      R Peroneal - EDB     Ankle EDB 5.7 <=6.5 1.2 >=2.0 100 Ankle - EDB 9   4.5     Fib head EDB 12.0  0.9  73.3 Fib head - Ankle 24 38 >=44 3.8     Pop fossa EDB 14.1  1.0  111 Pop fossa - Fib head 9 42 >=44 4.1         Pop fossa - Ankle      L Tibial - AH     Ankle AH 5.1 <=5.8 3.2 >=4.0 100 Ankle - AH 9   11.0     Pop fossa AH 14.0  3.3  101 Pop fossa - Ankle 33 37 >=41 7.8  R Tibial - AH     Ankle AH 5.6 <=5.8 3.5 >=4.0 100 Ankle - AH 9   8.5     Pop  fossa AH 13.7  3.3  94.8 Pop fossa - Ankle 31.6 39 >=41 9.1                 SNC    Nerve / Sites Rec. Site Peak Lat Ref.  Amp Ref. Segments Distance    ms ms V V  cm  L Radial - Anatomical snuff box (Forearm)     Forearm Wrist 2.7 <=2.9 23 >=15 Forearm - Wrist 10  L Sural - Ankle (Calf)     Calf Ankle NR <=4.4 NR >=6 Calf - Ankle 14  R Sural - Ankle (Calf)     Calf Ankle NR <=4.4 NR >=6 Calf - Ankle 14  L Superficial peroneal - Ankle     Lat leg Ankle NR <=4.4 NR >=6 Lat leg - Ankle 14  R Superficial peroneal - Ankle     Lat leg Ankle NR <=4.4 NR >=6 Lat leg - Ankle 14  L Median - Orthodromic (Dig II, Mid palm)     Dig II Wrist 3.9 <=3.4 10 >=10 Dig II - Wrist 13  L Ulnar - Orthodromic, (Dig V, Mid palm)     Dig V Wrist 3.2 <=3.1 5 >=5 Dig V - Wrist 60                   F  Wave    Nerve F Lat Ref.   ms ms  L Tibial - AH 55.7 <=56.0  R Tibial - AH 53.3 <=56.0  L Ulnar - ADM 29.2 <=32.0           EMG Summary Table     Spontaneous MUAP Recruitment  Muscle IA Fib PSW Fasc Other Amp Dur. Poly Pattern  R. Tibialis anterior Normal None None None _______ Normal Normal Normal Reduced  R. Tibialis posterior Normal None None None _______ Normal Normal Normal Reduced  R. Peroneus longus Normal None None None _______ Normal Normal Normal Reduced  R. Gastrocnemius (Medial head) Normal None None None _______ Normal Normal Normal Reduced  R. Vastus lateralis Normal None None None _______ Normal Normal Normal Reduced  L. Tibialis anterior Normal None None None _______ Normal Normal Normal Reduced  L. Peroneus longus Normal None None None _______ Normal Normal Normal Reduced  L. Gastrocnemius (Medial head) Normal None None None _______ Normal Normal Normal Reduced  L. Vastus lateralis Normal None None None _______ Normal Normal Normal Reduced  R. Lumbar paraspinals (low) Normal None None None _______ Normal Normal Normal Normal  R. Lumbar paraspinals (mid) Normal None None None _______ Normal Normal Normal Normal  L. Lumbar paraspinals (low) Normal None None None _______ Normal Normal Normal Normal  L. Lumbar paraspinals (mid) Normal None None None _______ Normal Normal Normal Normal

## 2023-05-12 NOTE — Progress Notes (Signed)
ASSESSMENT AND PLAN  Virginia Huber is a 77 y.o. female   Few years history of gradual onset unsteady gait, Long history of slow onset bilateral lower extremity paresthesia, and the setting of long history of diabetes, previous long-term large dose amiodarone treatment,  EMG nerve conduction study, performed mild axonal sensorimotor polyneuropathy,  MRI of the brain without contrast was done April 27, 2023, formal report pending, mild age related atrophy, mild to moderate small vessel disease no acute abnormality  MRI of cervical spine showed multilevel degenerative changes, no significant canal stenosis  Previous laboratory evaluation showed normal or negative ESR C-reactive protein SSA antibody, TSH, homocystine, B12, B6 was slightly decreased 3.0, protein electrophoresis,  Her complains of incoordination, unsteady gait could potentially related to previous amiodarone treatment, can be present in 4 to 9% of patient taking amiodarone  Suggest moderate exercise   DIAGNOSTIC DATA (LABS, IMAGING, TESTING) - I reviewed patient records, labs, notes, testing and imaging myself where available.   MEDICAL HISTORY:  Virginia Huber, is a 77 year old female, seen in request by Dr. Vickey Huger for electrodiagnostic study for evaluation of bilateral lower extremity paresthesia, her primary care is from Gaynelle Arabian, Jonny Ruiz,  History is obtained from the patient and review of electronic medical records. I personally reviewed pertinent available imaging films in PACS.   PMHx of  HLD DMx20 years,  CAD,  S/p Pacemaker A fib, on eliquis Left retinal vein occlusion, also left macular degeneration OSA-CPAP  She noticed gradual onset of unbalanced gait since 2022, she can work without assistant, but hard to keep her balance, sometimes to the left sometimes severe to the right, in addition, she has some bilateral hands tremor  She was treated with amiodarone for extended period of time due to  her cardiac issues, it was stopped in August 2024, since then, her tremor is better, dizziness has improved as well,  She has lost her left vision many years ago,  Also report slow onset years history of bilateral feet numbness, occasionally burning pain, she does suffer 2 diabetes for many years,  EMG nerve conduction study May 12, 2023, performed mild axonal sensorimotor polyneuropathy, no significant distal motor weakness to explain her gait complaints  MRI of the brain without contrast was done April 27, 2023, formal report pending, mild age related atrophy, mild to moderate small vessel disease no acute abnormality  MRI of cervical spine showed multilevel degenerative changes, no significant canal stenosis  Laboratory evaluation showed normal or negative ESR C-reactive protein SSA antibody, TSH, homocystine, B12, B6 was slightly decreased 3.0, protein electrophoresis,  [employer] documented amiodarone side effect including Abnormal gait a) Incidence: 4% to 9% [194] b) Neurologic problems including poor coordination and gait are extremely common, occurring in 20 to 40% of patients on amiodarone therapy. Abnormal gait was reported in 4% to 9% of patients treated with amiodarone for a mean duration of 441.3 days (range, 2 to 1515 days) in a retrospective study (n=241). Rarely does therapy need to be stopped for these problems and they may respond to dose reductions or discontinuation [194].  Coordination problem a) Incidence: 4% to 9% [194] b) Neurologic problems including poor coordination and gait are very common, occurring in 20% to 40% of patients on amiodarone therapy. Lack of coordination was reported in 4% to 9% of patients treated with amiodarone for a mean duration of 441.3 days (range, 2 to 1515 days) in a retrospective study (n=241). Rarely does therapy need to be stopped for  these problems and they may respond to dose reductions or discontinuation [194].  PHYSICAL EXAM:     05/12/2023   10:30 AM 04/14/2023    2:27 PM 04/14/2023    4:15 AM  Vitals with BMI  Height 5\' 3"     Systolic 168 161 253  Diastolic 97 57 61  Pulse 78 62 66     PHYSICAL EXAMNIATION:  Gen: NAD, conversant, well nourised, well groomed                     Cardiovascular: Irregular rate rhythm,   Eyes: Conjunctivae clear without exudates or hemorrhage Neck: Supple, no carotid bruits. Pulmonary: Clear to auscultation bilaterally   NEUROLOGICAL EXAM:  MENTAL STATUS: Speech/cognition: Awake, alert, oriented to history taking and casual conversation CRANIAL NERVES: CN II:  OD 20/50, OS no finger counting, CN III, IV, VI: extraocular movement are normal. No ptosis. CN V: Facial sensation is intact to light touch CN VII: Face is symmetric with normal eye closure  CN VIII: Hearing is normal to causal conversation. CN IX, X: Phonation is normal. CN XI: Head turning and shoulder shrug are intact  MOTOR: No significant bilateral upper and lower extremity proximal and distal weakness  REFLEXES: Reflexes are 2+ and symmetric at the biceps, triceps, knees, and absent at ankles. Plantar responses are flexor.  SENSORY: Length-dependent decreased light touch pinprick and vibratory sensation to distal shin level  COORDINATION: There is no trunk or limb dysmetria noted.  GAIT/STANCE: Push-up to get up from seated position, wide-based, cautious, mildly unsteady, able to stand up on tiptoe and heels  REVIEW OF SYSTEMS:  Full 14 system review of systems performed and notable only for as above All other review of systems were negative.   ALLERGIES: Allergies  Allergen Reactions   Penicillins Hives    Has patient had a PCN reaction causing immediate rash, facial/tongue/throat swelling, SOB or lightheadedness with hypotension: Yes Has patient had a PCN reaction causing severe rash involving mucus membranes or skin necrosis: No Has patient had a PCN reaction that required hospitalization  No Has patient had a PCN reaction occurring within the last 10 years: No If all of the above answers are "NO", then may proceed with Cephalosporin use.   Percocet [Oxycodone-Acetaminophen] Nausea And Vomiting    HOME MEDICATIONS: Current Outpatient Medications  Medication Sig Dispense Refill   acetaminophen (TYLENOL) 500 MG tablet Take 1,000 mg by mouth every 8 (eight) hours as needed for headache, moderate pain or mild pain.     ALPRAZolam (XANAX) 0.25 MG tablet Take 0.5-1 tablets (0.125-0.25 mg total) by mouth at bedtime as needed for sleep. 30 tablet 0   atorvastatin (LIPITOR) 80 MG tablet TAKE ONE TABLET BY MOUTH EVERY DAY AT 6:00PM (Patient taking differently: Take 80 mg by mouth daily.) 90 tablet 3   calcitRIOL (ROCALTROL) 0.25 MCG capsule Take 0.25 mcg by mouth every Monday, Wednesday, and Friday.     carvedilol (COREG) 25 MG tablet Take 1 tablet (25 mg total) by mouth 2 (two) times daily. 60 tablet 3   Cholecalciferol 50 MCG (2000 UT) TABS Take 4,000 Units by mouth at bedtime.     clopidogrel (PLAVIX) 75 MG tablet TAKE ONE TABLET (75MG  TOTAL) BY MOUTH DAILY (Patient taking differently: Take 75 mg by mouth daily.) 90 tablet 3   Continuous Blood Gluc Receiver (FREESTYLE LIBRE 2 READER) DEVI 1 each by Does not apply route daily. 1 each 0   Continuous Glucose Sensor (FREESTYLE LIBRE 2  SENSOR) MISC USE ONE FOR FOURTEEN DAYS 6 each 3   ELIQUIS 5 MG TABS tablet TAKE ONE TABLET BY MOUTH TWICE DAILY 60 tablet 5   furosemide (LASIX) 40 MG tablet Take 40 mg by mouth daily as needed for fluid or edema.     glucose blood (FREESTYLE PRECISION NEO TEST) test strip Use as instructed 100 each 3   Insulin NPH, Human,, Isophane, (NOVOLIN N FLEXPEN) 100 UNIT/ML Kiwkpen Inject 15 units subcutaneously in the morning & inject 4 units subcutaneously at night.     levothyroxine (SYNTHROID) 112 MCG tablet TAKE ONE TABLET BY MOUTH ONCE DAILY 90 tablet 2   nitroGLYCERIN (NITROSTAT) 0.4 MG SL tablet Place 1 tablet  (0.4 mg total) under the tongue every 5 (five) minutes x 3 doses as needed for chest pain (If not relief after 3rd dose, call 911 or go to ED). 25 tablet 0   rOPINIRole (REQUIP) 0.5 MG tablet Take 1 tablet (0.5 mg total) by mouth at bedtime. 30 tablet 5   Semaglutide, 1 MG/DOSE, (OZEMPIC, 1 MG/DOSE,) 4 MG/3ML SOPN Inject 1 mg into the skin once a week. 3 mL 2   No current facility-administered medications for this visit.    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 h/o PAF/flutter during admission in 2013 for PNA. b. PAF during adm for NSTEMI 07/2015, subsequent paroxysms since then.   B12 deficiency anemia    Branch retinal vein occlusion with macular edema of right eye 10/11/2019   Chronic renal failure, stage 3b (HCC) 09/23/2017   Coronary artery disease 11/30/2014   a. remote MI. b. h/o PTCA with scoring balloon to OM1 11/2014. c. NSTEMI 03/2015 s/p DES to prox-mid Cx. d. NSTEMI 07/2015 s/p scoring balloon/PTCA/DES to dRCA with PAF during that admission   Cutaneous lupus erythematosus    Early stage nonexudative age-related macular degeneration of right eye 02/27/2020   Essential hypertension    GERD (gastroesophageal reflux disease)    History of blood transfusion 1980's   2nd surgical procedures   Hypercholesteremia    Hypothyroidism    Myocardial infarction (HCC) 02/2012   NSTEMI (non-ST elevated myocardial infarction) (HCC) 04/02/2015   OSA (obstructive sleep apnea) 05/13/2016   Ovarian tumor    PAD (peripheral artery disease) (HCC)    a. s/p LE angio 2015; followed by Dr. Kirke Corin - managed medically.   Pericardial effusion    a. 06/2016 after ppm - s/p  pericardiocentesis.   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 s/p Medtronic PPM 06/2016, c/b lead perf/pericardial effusion.   TIA (transient ischemic attack)    Type 2 diabetes with nephropathy (HCC) 02/29/2012    PAST SURGICAL HISTORY: Past Surgical History:  Procedure Laterality Date   A-FLUTTER ABLATION N/A 12/08/2022   Procedure: A-FLUTTER ABLATION;  Surgeon: Regan Lemming, MD;  Location: [facility]  INVASIVE CV LAB;  Service: Cardiovascular;  Laterality: N/A;   ABDOMINAL AORTAGRAM N/A 01/03/2014   Procedure: ABDOMINAL Ronny Flurry;  Surgeon: Iran Ouch, MD;  Location: [facility] CATH LAB;  Service: Cardiovascular;  Laterality: N/A;   ABDOMINAL HYSTERECTOMY  06/22/1970   "partial"   APPENDECTOMY  02/20/1969   ATRIAL FIBRILLATION ABLATION N/A 08/14/2022   Procedure: ATRIAL FIBRILLATION ABLATION;  Surgeon: Regan Lemming, MD;  Location: [facility] INVASIVE CV LAB;  Service: Cardiovascular;  Laterality: N/A;   ATRIAL FIBRILLATION ABLATION N/A 12/08/2022   Procedure: ATRIAL FIBRILLATION ABLATION;  Surgeon: Regan Lemming, MD;  Location: [facility] INVASIVE CV LAB;  Service: Cardiovascular;  Laterality: N/A;   CARDIAC CATHETERIZATION  06/22/2006   Tiny OM-2 with 90% narrowing. Med tx.   CARDIAC CATHETERIZATION N/A 11/30/2014   Procedure: Left Heart Cath and Coronary Angiography;  Surgeon: Lennette Bihari, MD; LAD 20%, CFX 50%, OM1 95%, right PLB 30%, LV normal    CARDIAC CATHETERIZATION N/A 11/30/2014   Procedure: Coronary Balloon Angioplasty;  Surgeon: Lennette Bihari, MD;  Angiosculpt scoring balloon and PTCA to the OM1 reducing stenosis  from 95% to less than 10%   CARDIAC CATHETERIZATION N/A 04/03/2015   Procedure: Left Heart Cath and Coronary Angiography;  Surgeon: Dolores Patty, MD; dLAD 50%, CFX 90%, OM1 100%, PLA 15%, LVEDP 13     CARDIAC CATHETERIZATION N/A 04/03/2015   Procedure: Coronary Stent Intervention;  Surgeon: Tonny Bollman, MD; 3.0x18 mm Xience DES to the CFX     CARDIAC CATHETERIZATION N/A 08/02/2015   Procedure: Left Heart Cath and Coronary Angiography;  Surgeon: Lennette Bihari, MD;  Location: [facility] INVASIVE CV LAB;  Service: Cardiovascular;  Laterality: N/A;   CARDIAC CATHETERIZATION N/A 08/02/2015   Procedure: Coronary Stent Intervention;  Surgeon: Lennette Bihari, MD;  Location: [facility] INVASIVE CV LAB;  Service: Cardiovascular;  Laterality: N/A;   CARDIAC CATHETERIZATION N/A 06/25/2016   Procedure: Pericardiocentesis;  Surgeon: Will Jorja Loa, MD;  Location: [facility] INVASIVE CV LAB;  Service: Cardiovascular;  Laterality: N/A;   cardiac stents     CARDIOVERSION N/A 12/15/2017   Procedure: CARDIOVERSION;  Surgeon: Elease Hashimoto Deloris Ping, MD;  Location: [facility] ENDOSCOPY;  Service: Cardiovascular;  Laterality: N/A;   CARDIOVERSION N/A 05/12/2022   Procedure: CARDIOVERSION;  Surgeon: Quintella Reichert, MD;  Location: [facility] ENDOSCOPY;  Service: Cardiovascular;  Laterality: N/A;   CARDIOVERSION N/A 09/30/2022   Procedure: CARDIOVERSION;  Surgeon: Thurmon Fair, MD;  Location: [facility] INVASIVE CV LAB;  Service: Cardiovascular;  Laterality: N/A;   CARDIOVERSION N/A 10/29/2022   Procedure: CARDIOVERSION;  Surgeon: Quintella Reichert, MD;  Location: [facility] INVASIVE CV LAB;  Service: Cardiovascular;  Laterality: N/A;   CHOLECYSTECTOMY OPEN  02/20/1989   COLONOSCOPY  06/23/2003   Dr. Karilyn Cota: pancolonic divericula, polyp, path unknown currently   COLONOSCOPY  06/22/2010   Dr. Darrick Penna: Normal TI, scattered diverticula in entire colon, small internal hemorrhoids, normal colon biopsies. Colonoscopy in 5-10 years.    COLONOSCOPY WITH PROPOFOL N/A  06/02/2021   pancolonic diverticulosis. Two 4-6 mm polyps in transverse colon. Sessile serrated and hyperplastic. 5 year surveillance if benefits outweigh the risks.   COLOSTOMY  05/23/1979   COLOSTOMY REVERSAL  11/21/1979   EP IMPLANTABLE DEVICE N/A 06/25/2016   Procedure: Lead Revision/Repair;  Surgeon: Will Jorja Loa, MD;  Location: [facility] INVASIVE CV LAB;  Service: Cardiovascular;  Laterality: N/A;   EP IMPLANTABLE DEVICE N/A 06/25/2016   Procedure: Pacemaker Implant;  Surgeon: Will Jorja Loa, MD;  Location: [facility] INVASIVE CV LAB;  Service: Cardiovascular;  Laterality: N/A;   EXCISIONAL HEMORRHOIDECTOMY  02/20/1969   EYE SURGERY Left 06/22/1998   "branch vein occlusion"   EYE SURGERY Left ~ 2001   "smoothed out wrinkle"   LEFT HEART CATH AND CORONARY ANGIOGRAPHY N/A 05/11/2022   Procedure: LEFT HEART CATH AND CORONARY ANGIOGRAPHY;  Surgeon: Kathleene Hazel, MD;  Location: [facility] INVASIVE CV LAB;  Service: Cardiovascular;  Laterality: N/A;   LEFT OOPHORECTOMY  05/23/1979   nicked bowel, peritonitis, colostomy; colostomy reversed 1981    LOWER EXTREMITY ANGIOGRAM N/A 01/03/2014   Procedure: LOWER EXTREMITY ANGIOGRAM;  Surgeon: Iran Ouch, MD;  Location: [facility] CATH LAB;  Service: Cardiovascular;  Laterality: N/A;   Nuclear med stress test  10/21/2011   Small area of mild ischemia inferoapically.   PARTIAL HYSTERECTOMY  02/20/1969   left ovaries, then ovaries removed later due tumors    POLYPECTOMY  06/02/2021   Procedure: POLYPECTOMY;  Surgeon: Lanelle Bal, DO;  Location: [facility] ENDO SUITE;  Service: Endoscopy;;   right eye surgery  03/2021   RIGHT OOPHORECTOMY  02/20/1969   TEE WITHOUT CARDIOVERSION N/A 05/12/2022   Procedure: TRANSESOPHAGEAL ECHOCARDIOGRAM (TEE);  Surgeon: Quintella Reichert, MD;  Location: [facility] ENDOSCOPY;  Service: Cardiovascular;  Laterality: N/A;   TEE WITHOUT CARDIOVERSION N/A 08/14/2022   Procedure: TRANSESOPHAGEAL ECHOCARDIOGRAM;  Surgeon: Regan Lemming, MD;  Location: [facility] INVASIVE CV LAB;  Service: Cardiovascular;  Laterality: N/A;   TEE WITHOUT CARDIOVERSION N/A 12/08/2022   Procedure: TRANSESOPHAGEAL ECHOCARDIOGRAM;  Surgeon: Regan Lemming, MD;  Location: [facility] INVASIVE CV LAB;  Service: Cardiovascular;  Laterality: N/A;    FAMILY HISTORY: Family History  Problem Relation Age of Onset   Heart disease Mother        deceased   Heart disease Father        deceased, heart disease   Diabetes Brother    Heart disease Brother    Thyroid disease Brother    Heart disease Sister    Heart disease Brother    Thyroid disease Brother    Lupus Daughter    Colon cancer Neg Hx     SOCIAL HISTORY: Social History   Socioeconomic History   Marital status: Married    Spouse name: Not on file   Number of children: Not on file   Years of education: Not on file   Highest education level: Not on file  Occupational History   Occupation: Retired    Associate Professor: RETIRED    Comment: insurance billing  Tobacco Use   Smoking status: Never    Passive exposure: Never   Smokeless tobacco: Never   Tobacco comments:    Never smoke 10/08/22  Vaping Use   Vaping status: Never Used  Substance and Sexual Activity   Alcohol use: No    Alcohol/week: 0.0 standard drinks of alcohol   Drug use: No   Sexual activity: Never    Birth control/protection: Surgical    Comment: hyst  Other Topics Concern   Not on file  Social History Narrative   Not on file   Social Determinants of Health   Financial Resource Strain: Not on file  Food Insecurity: No Food Insecurity (04/13/2023)   Hunger Vital Sign    Worried About Running Out of Food in the Last Year: Never true    Ran Out of Food in the Last Year: Never true  Transportation Needs: No Transportation Needs (04/13/2023)   PRAPARE - Administrator, Civil Service (Medical): No  Lack of Transportation (Non-Medical): No  Physical Activity: Not on file  Stress: Not on file  Social  Connections: Not on file  Intimate Partner Violence: Not At Risk (04/13/2023)   Humiliation, Afraid, Rape, and Kick questionnaire    Fear of Current or Ex-Partner: No    Emotionally Abused: No    Physically Abused: No    Sexually Abused: No      Levert Feinstein, M.D. Ph.D.  [facility] [address] [place], [place] [address] Ph: [phone] Fax: [phone]  CC:  Assunta Found, MD [address] [place],  [place] [address]  Assunta Found, MD

## 2023-05-17 ENCOUNTER — Ambulatory Visit: Payer: PPO | Attending: Cardiology | Admitting: Nurse Practitioner

## 2023-05-17 ENCOUNTER — Encounter: Payer: Self-pay | Admitting: Nurse Practitioner

## 2023-05-17 VITALS — BP 122/68 | HR 81 | Ht 63.0 in | Wt 128.0 lb

## 2023-05-17 DIAGNOSIS — I484 Atypical atrial flutter: Secondary | ICD-10-CM

## 2023-05-17 DIAGNOSIS — I5022 Chronic systolic (congestive) heart failure: Secondary | ICD-10-CM

## 2023-05-17 DIAGNOSIS — I10 Essential (primary) hypertension: Secondary | ICD-10-CM | POA: Diagnosis not present

## 2023-05-17 DIAGNOSIS — Z950 Presence of cardiac pacemaker: Secondary | ICD-10-CM

## 2023-05-17 DIAGNOSIS — G4733 Obstructive sleep apnea (adult) (pediatric): Secondary | ICD-10-CM

## 2023-05-17 DIAGNOSIS — I739 Peripheral vascular disease, unspecified: Secondary | ICD-10-CM

## 2023-05-17 DIAGNOSIS — I2510 Atherosclerotic heart disease of native coronary artery without angina pectoris: Secondary | ICD-10-CM | POA: Diagnosis not present

## 2023-05-17 DIAGNOSIS — I951 Orthostatic hypotension: Secondary | ICD-10-CM | POA: Diagnosis not present

## 2023-05-17 DIAGNOSIS — I4891 Unspecified atrial fibrillation: Secondary | ICD-10-CM | POA: Diagnosis not present

## 2023-05-17 NOTE — Patient Instructions (Addendum)
Medication Instructions:  Your physician recommends that you continue on your current medications as directed. Please refer to the Current Medication list given to you today.  Labwork: None   Testing/Procedures: None   Follow-Up: Your physician recommends that you schedule a follow-up appointment in:  Nurse visit for BP check in 2-3 weeks Please bring home device  3 months Philis Nettle or APP   Any Other Special Instructions Will Be Listed Below (If Applicable).  If you need a refill on your cardiac medications before your next appointment, please call your pharmacy.

## 2023-05-17 NOTE — Progress Notes (Signed)
Cardiology Office Note:    Date:  05/17/2023  ID:  Virginia Huber, DOB 1946-02-03, MRN [id number] PCP:  Assunta Found, MD [facility] Providers Cardiologist:  Nona Dell, MD Electrophysiologist:  Will Jorja Loa, MD  Referring MD: Assunta Found, MD   CC: Here for follow-up  History of Present Illness:    Virginia Huber is a 77 y.o. female with a PMH of CAD, s/p angioplasty to OM1 in 2016, DES to Cx in 2016, and DES to RCA in 2017, PAF, PAD, HTN, HFpEF, OSA, CKD stage 3b (Follows Nephrology), SSS, s/p Medtronic PPM in 2018, and hx of orthostatic hypotension (past hx of fall), who presents today for scheduled follow-up.  Seen by Dr. Diona Browner 02/2022.  Was reportedly doing much better, denied any palpitations.  Saw Dr. Elberta Fortis later that month and reported orthostatic dizziness a few times/week, also occurred at other times as well. Was holding medicines, somewhat helped.  SBP's later on were averaging 170s, referred to hypertension clinic. Amio seemed to make tremors worse, but was willing to continue if Multaq was unaffordable.  Visited ED with substernal CP 04/2022, was SHOB/ with diaphoresis. Had a mechanical fall 5 days PTA, went to UC due to L-sided rib pain, x-rays negative. Troponin 20 >> 24.  CXR negative.  EKG revealed ST, with IVCD.  Transferred to [facility]. Underwent LHC that revealed mild to moderate diffuse disease of LAD without focal stenosis, patent stents, with chronic occlusion of small caliber first OM branch. Small caliber distal PL artery beyond the stented segment, moderately severe disease at bifurcation, vessels were too small for PCI.  Medical management recommended.  TTE revealed EF 40 to 45%, RWMA's of left ventricle, severe concentric LVH, trivial MR. Successful TEE with DCCV on 11/21. TEE revealed EF 50-55%, severe LVH.   Pre-procedure TEE prior to ablation 07/2022 showed EF 30-35%, mild MR. Pt had shortness of breath, admitted for further observation,  given IV lasix and CXR showed cardiomegaly with interstitial edema, small effusion, CHF favored with LLL collapse/consolidation. SOB resolved and post-op bilateral groins oozed blood, eventually resolved. After PVI, converted to NSR on Amiodarone.   I last saw patient for follow-up on August 31, 2022. She was doing better since previous office visit. Denied any chest pain, shortness of breath, palpitations, syncope, presyncope, dizziness, orthopnea, PND, swelling or significant weight changes, acute bleeding, or claudication. Still felt fatigued. Denied any other issues.   Since I last saw patient she underwent A-fib ablation in June 2024.  Has been followed by EP and A-fib clinic.  Was noted to have some tremulousness and amiodarone was stopped, slightly improved after stopping amiodarone.  Was to be evaluated by neurology at the time.  Hospitalized for chest pain in October 2024.  Was noted to have a UTI and hypokalemia, both were treated.  Echocardiogram revealed improved LVEF at 45 to 50%.  Troponins were reassuring.  Today she presents for scheduled follow-up and hospital follow-up.  She states she is doing well and denies any CP since leaving the hospital. Denies any chest pain, shortness of breath, palpitations, syncope, presyncope, dizziness, orthopnea, PND, swelling or significant weight changes, acute bleeding, or claudication.  ROS:    Please see the history of present illness.    All other systems reviewed and are negative.  EKGs/Labs/Other Studies Reviewed:    The following studies were reviewed today:   EKG:  EKG is not ordered today.   Echo 03/2023:  1. Left ventricular ejection fraction, by estimation,  is 45 to 50%. The  left ventricle has mildly decreased function. The left ventricle  demonstrates global hypokinesis. The left ventricular internal cavity size was mildly dilated. There is mild concentric left ventricular hypertrophy. Left ventricular diastolic parameters are  consistent with Grade II diastolic dysfunction (pseudonormalization).   2. Right ventricular systolic function is normal. The right ventricular  size is normal. Tricuspid regurgitation signal is inadequate for assessing PA pressure.   3. Left atrial size was moderately dilated.   4. The mitral valve is degenerative. Trivial mitral valve regurgitation.  Moderate mitral annular calcification.   5. The aortic valve is tricuspid. There is mild calcification of the  aortic valve. Aortic valve regurgitation is not visualized. Aortic valve  sclerosis/calcification is present, without any evidence of aortic  stenosis.   6. The inferior vena cava is normal in size with greater than 50%  respiratory variability, suggesting right atrial pressure of 3 mmHg.   Comparison(s): Prior images reviewed side by side. LVEF has improved, now in the range of 45-50%.  TEE on 05/12/2022: 1. Left ventricular ejection fraction, by estimation, is 50 to 55%. The  left ventricle has low normal function. The left ventricle has no regional  wall motion abnormalities. There is severe concentric left ventricular  hypertrophy.   2. Right ventricular systolic function is normal. The right ventricular  size is normal.   3. Left atrial size was mildly dilated. No left atrial/left atrial  appendage thrombus was detected. The LAA emptying velocity was 16 cm/s.   4. The mitral valve is normal in structure. Mild mitral valve  regurgitation. No evidence of mitral stenosis.   5. The aortic valve is tricuspid. Aortic valve regurgitation is not  visualized. Aortic valve sclerosis/calcification is present, without any  evidence of aortic stenosis.   6. The inferior vena cava is normal in size with greater than 50%  respiratory variability, suggesting right atrial pressure of 3 mmHg.   Conclusion(s)/Recommendation(s): Normal biventricular function without  evidence of hemodynamically significant valvular heart disease.   Left  heart cath on 05/11/22:   Prox Cx to Mid Cx lesion is 10% stenosed.   1st Mrg lesion is 100% stenosed.   Ost LAD to Mid LAD lesion is 30% stenosed.   Mid LAD to Dist LAD lesion is 30% stenosed.   Ost RCA to Prox RCA lesion is 40% stenosed.   RPAV-1 lesion is 20% stenosed.   RPAV-2 lesion is 70% stenosed.   2nd RPL lesion is 70% stenosed.   The LAD is a moderate caliber vessel that courses to the apex. There appears to be diffuse mild to moderate disease in the entire LAD without a focal stenosis.  2. The Circumflex has a patent proximal stented segment. This vessel terminates into an obtuse marginal branch. The small caliber first obtuse marginal branch is chronically occluded.  3. The RCA is a large caliber dominant vessel with a patent stent in the posterolateral artery. The small caliber distal posterolateral artery beyond the stented segment has moderately severe disease at a bifurcation but the vessels appear too small for PCI.  4. Normal LVEDP   Recommendations: Medical management of CAD. Consider adding Imdur or Ranexa for angina in setting of diffuse small vessel disease.    2D Echo complete on 05/10/2022: 1. Severe hypokinesis of the anteroseptal wall with overall mild LV  dysfunction.   2. Left ventricular ejection fraction, by estimation, is 40 to 45%. The  left ventricle has mildly decreased function. The left  ventricle  demonstrates regional wall motion abnormalities (see scoring  diagram/findings for description). There is severe  concentric left ventricular hypertrophy. Left ventricular diastolic  parameters are indeterminate.   3. Right ventricular systolic function is normal. The right ventricular  size is normal.   4. The mitral valve is normal in structure. Trivial mitral valve  regurgitation. No evidence of mitral stenosis.   5. The aortic valve is tricuspid. Aortic valve regurgitation is not  visualized. Aortic valve sclerosis is present, with no evidence of  aortic  valve stenosis.   6. The inferior vena cava is normal in size with greater than 50%  respiratory variability, suggesting right atrial pressure of 3 mmHg.  Lexiscan on 01/20/2018: Downsloping ST segment depression ST segment depression of 0.5 mm was noted during stress in the II, III, aVF, V5 and V6 leads. Defect 1: There is a small defect of mild severity present in the apical anterior and apical septal location. This likely represents soft tissue attenuation. A small degree of scar cannot entirely be excluded. No signficant ischemia. This is a low risk study. Nuclear stress EF: 64%.  Left heart cath on 08/02/2015: 1st RPLB lesion, 50% stenosed. Dist RCA lesion, 30% stenosed. Ost 1st Mrg to 1st Mrg lesion, 100% stenosed. Ost LAD lesion, 40% stenosed. Mid LAD lesion, 40% stenosed. Post Atrio lesion, 90% stenosed. Post intervention, there is a 0% residual stenosis. The left ventricular systolic function is normal. Mid RCA lesion, 20% stenosed.   Normal LV function without focal segmental wall motion abnormalities and ejection fraction of 55%.   Significant coronary obstructive disease with diffuse mild luminal narrowing of the LAD with 40% proximal and mid stenoses; widely patent stent in the proximal circumflex with old occlusion of the marginal branch which had arisen in the region of the stented segment but with evidence for very faint collateralization to this distal marginal vessel from the the LAD; a large dominant RCA with 20%, mild mid narrowing, 30% narrowing after the acute margin and focal 90% stenosis distally prior to a PDA 2 vessel with 50% narrowing at the ostium of this PDA prior to a bend in the vessel with the RCA ending in the PLA vessel.   Successful percutaneous cardiac intervention to the distal RCA treated with Angiosculpt scoring balloon, PTCA, and ultimate stenting with a 2.58 mm Xience Alpine DES stent postdilated to 2.51 mm with a 90% stenosis reduced to 0% and  no change in the ostial PDA narrowing.   RECOMMENDATION: Since the patient suffered a non-ST segment elevation myocardial infarction on Plavix, Brilinta was administered for oral antiplatelet therapy.  It was felt most likely the patient's atrial fibrillation was ischemic mediated.  If anticoagulation is necessary, Brilinta will need to be discontinued and changed back to Plavix due to potential bleed risk if  triple therapy is needed short-term.  Risk Assessment/Calculations:    CHA2DS2-VASc Score = 6  This indicates a 9.7% annual risk of stroke. The patient's score is based upon: CHF History: 1 HTN History: 1 Diabetes History: 0 Stroke History: 0 Vascular Disease History: 1 Age Score: 2 Gender Score: 1  Physical Exam:    VS:  BP 122/68   Pulse 81   Ht 5\' 3"  (1.6 m)   Wt 128 lb (58.1 kg)   SpO2 93%   BMI 22.67 kg/m     Wt Readings from Last 3 Encounters:  05/17/23 128 lb (58.1 kg)  04/13/23 128 lb 1.4 oz (58.1 kg)  03/23/23 118 lb (  53.5 kg)    GEN: Well nourished, well developed in no acute distress HEENT: Normal NECK: No JVD; No carotid bruits CARDIAC: S1/S2, RRR, no murmurs, rubs, gallops; 2+ pulses  RESPIRATORY:  Clear and diminished to auscultation without rales, wheezing or rhonchi  MUSCULOSKELETAL:  No edema; No deformity  SKIN: Thin skin, warm and dry NEUROLOGIC:  Alert and oriented x 3, improved tremors  PSYCHIATRIC:  Normal affect   ASSESSMENT & PLAN:    In order of problems listed above:   PAF/A-flutter, s/p PVI ablation 07/2022, long term anticoagulation  Denies any tachycardia or palpitations. Doing well post ablation. CHA2DS2-VASc score 6. Continue current medication regimen.  Currently she is on appropriate dosage of Eliquis, denies any issues.  Discussed avoiding triggers to A-fib.  Continue rest of current medication regimen. Continue to follow-up with up with A-fib clinic and EP.   CAD, s/p angioplasty to OM1 in 2016, DES to circumflex in 2016, and  DES to RCA in 2017 Denies any CP. No indication for ischemic evaluation. Continue current meds. ED precautions discussed. Heart healthy diet and regular cardiovascular exercise encouraged.   3. PAD ABI's in 2015 revealed moderate depression of resting left ABI to 0.66, segmental analysis revealed pattern of tibial and potentially distal SFA/popliteal disease with digital disease also suspected; no evidence of significant arterial occlusive disease in right lower extremity.  She denies any recent claudication symptoms.  Continue current medication regimen.  4. 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 EF 45-50% 03/2023. Euvolemic and well compensated on exam. Low sodium diet, fluid restriction <2L, and daily weights encouraged. Educated to contact our office for weight gain of 2 lbs overnight or 5 lbs in one week. Continue current medication regimen. Continue carvedilol, unable to tolerate ARB, d/t kidney disease. Heart healthy diet and regular cardiovascular exercise encouraged. Consider SGLT2i/spironolactone in future if EF continues to decline for GDMT.   5. Hypertension Blood pressure well controlled today during office visit, BP log from home shows some elevated BP readings. No medication chages at this time. Discussed to monitor BP at home at least 2 hours after medications and sitting for 5-10 minutes.  BP log given today and will bring her back for BP check and make sure her home device is accurate. Heart healthy diet and regular cardiovascular exercise encouraged. Consider starting low dose MRA is BP is not at goal by next OV.  6. SSS, s/p Pacemaker Remote device check 03/2023 was normal. Continue to follow up with EP.   7. Orthostatic hypotension Denies any recent symptoms.  Continue current medication regimen.  Continue to follow with PCP.  8. OSA on CPAP Does report wearing CPAP. Continue to follow with PCP.  9.  Disposition: Follow-up with me or Dr. Diona Browner  in 3 months or sooner if anything changes.    Medication Adjustments/Labs and Tests Ordered: Current medicines are reviewed at length with the patient today.  Concerns regarding medicines are outlined above.  No orders of the defined types were placed in this encounter.  No orders of the defined types were placed in this encounter.   Patient Instructions  Medication Instructions:  Your physician recommends that you continue on your current medications as directed. Please refer to the Current Medication list given to you today.  Labwork: None   Testing/Procedures: None   Follow-Up: Your physician recommends that you schedule a follow-up appointment in:  Nurse visit for BP check in 2-3 weeks Please bring home device  3  months Philis Nettle or APP   Any Other Special Instructions Will Be Listed Below (If Applicable).  If you need a refill on your cardiac medications before your next appointment, please call your pharmacy.   Signed, Sharlene Dory, NP

## 2023-05-22 DIAGNOSIS — I482 Chronic atrial fibrillation, unspecified: Secondary | ICD-10-CM | POA: Diagnosis not present

## 2023-05-22 DIAGNOSIS — I130 Hypertensive heart and chronic kidney disease with heart failure and stage 1 through stage 4 chronic kidney disease, or unspecified chronic kidney disease: Secondary | ICD-10-CM | POA: Diagnosis not present

## 2023-05-22 DIAGNOSIS — D6869 Other thrombophilia: Secondary | ICD-10-CM | POA: Diagnosis not present

## 2023-05-27 ENCOUNTER — Telehealth: Payer: Self-pay

## 2023-05-27 ENCOUNTER — Ambulatory Visit (INDEPENDENT_AMBULATORY_CARE_PROVIDER_SITE_OTHER): Payer: PPO | Admitting: Podiatry

## 2023-05-27 ENCOUNTER — Encounter: Payer: Self-pay | Admitting: Podiatry

## 2023-05-27 DIAGNOSIS — M2040 Other hammer toe(s) (acquired), unspecified foot: Secondary | ICD-10-CM

## 2023-05-27 DIAGNOSIS — B351 Tinea unguium: Secondary | ICD-10-CM

## 2023-05-27 DIAGNOSIS — M79674 Pain in right toe(s): Secondary | ICD-10-CM | POA: Diagnosis not present

## 2023-05-27 DIAGNOSIS — Z7901 Long term (current) use of anticoagulants: Secondary | ICD-10-CM

## 2023-05-27 DIAGNOSIS — E119 Type 2 diabetes mellitus without complications: Secondary | ICD-10-CM

## 2023-05-27 DIAGNOSIS — M79675 Pain in left toe(s): Secondary | ICD-10-CM

## 2023-05-27 NOTE — Telephone Encounter (Signed)
I called patient. I discussed her MRI cervical and brain results dated 05/26/2023.   Patient asked about the results of the NCV. She reports that she hasn't been given those results and a plan. She would like a call once those are reviewed.  From Dr. Zannie Cove NCV procedure note on 05/12/2023:

## 2023-05-27 NOTE — Progress Notes (Signed)
  Subjective:  Patient ID: Virginia Huber, female    DOB: 1946-04-20,  MRN: [id number]  Chief Complaint  Patient presents with   Foot Pain    Nail trim both feet. DM 2    77 y.o. female presents with the above complaint. History confirmed with patient. Patient presenting with pain related to dystrophic thickened elongated nails. Patient is unable to trim own nails related to nail dystrophy and/or mobility issues. Patient does have a history of T2DM.  Patient is on chronic anticoagulation therapy.  Objective:  Physical Exam: warm, good capillary refill nail exam onychomycosis of the toenails, onycholysis, and dystrophic nails DP pulses palpable, PT pulses palpable, and protective sensation intact Left Foot:  Pain with palpation of nails due to elongation and dystrophic growth.  Right Foot: Pain with palpation of nails due to elongation and dystrophic growth.   Assessment:   1. Pain due to onychomycosis of toenails of both feet   2. Anticoagulated   3. Hammer toe, unspecified laterality   4. Diabetes mellitus without complication (HCC)        Plan:  Patient was evaluated and treated and all questions answered.  #Onychomycosis with pain  -Nails palliatively debrided as below. -Educated on self-care  Procedure: Nail Debridement Rationale: Pain Type of Debridement: manual, sharp debridement. Instrumentation: Nail nipper, rotary burr. Number of Nails: 10  Return in about 3 months (around 08/25/2023) for Diabetic Foot Care.         Bronwen Betters, DPM [facility] / [facility]

## 2023-05-27 NOTE — Telephone Encounter (Signed)
-----   Message from Marmaduke Dohmeier sent at 05/26/2023  4:39 PM EST ----- No significant stenosis, minor DDD at cervical level.

## 2023-05-27 NOTE — Telephone Encounter (Signed)
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 There is a possibility that amiodarone can contribute to this neuropathy.  An impingement at the vertebral- spinal level was not seen. Neuropathy can be diabetic related.  Some neuropathies are related to circulation issues.   Tremor and balance problems can be a result of medication such as amiodarone as well.   She was finally able to get off amiodarone in September 2024 prior to the nerve conduction and EMG study.  It may take months to see if she can completely recover.  The hand tremor has been reported for over one year- bilaterally present, -this is unlikely a manifestation of Parkinson's disease should the patient be concerned about this.  MRI brain with mild ,generalized atrophy, white matter disease and lacunar infarcts ( the main risk factor for lacunes are HTN, DM and atrial fib).  This microvascular disease can contribute to slowing of movements , incoordination and gait impairment.    Mri cervical spine is not showing significant findings of canal stenosis, foraminal stenosis.  Please share with PCP and Patient.

## 2023-05-30 DIAGNOSIS — G4733 Obstructive sleep apnea (adult) (pediatric): Secondary | ICD-10-CM | POA: Diagnosis not present

## 2023-06-04 DIAGNOSIS — N1832 Chronic kidney disease, stage 3b: Secondary | ICD-10-CM | POA: Diagnosis not present

## 2023-06-04 DIAGNOSIS — Z7901 Long term (current) use of anticoagulants: Secondary | ICD-10-CM | POA: Diagnosis not present

## 2023-06-04 DIAGNOSIS — I480 Paroxysmal atrial fibrillation: Secondary | ICD-10-CM | POA: Diagnosis not present

## 2023-06-04 DIAGNOSIS — I129 Hypertensive chronic kidney disease with stage 1 through stage 4 chronic kidney disease, or unspecified chronic kidney disease: Secondary | ICD-10-CM | POA: Diagnosis not present

## 2023-06-07 ENCOUNTER — Encounter: Payer: Self-pay | Admitting: Nurse Practitioner

## 2023-06-07 ENCOUNTER — Ambulatory Visit: Payer: PPO | Attending: Internal Medicine

## 2023-06-07 VITALS — BP 170/92 | HR 72

## 2023-06-07 DIAGNOSIS — I10 Essential (primary) hypertension: Secondary | ICD-10-CM | POA: Diagnosis not present

## 2023-06-07 NOTE — Progress Notes (Signed)
Patient here for Nurse BP check per [facility]  Patient has brought her home monitor to be calibrated  Patient brought home BP reading which have been scanned into the chart and sent to peck for review  Patient present well no SOB, weakness or Chest pain  Patient states she has not taken BP medication this morning, she states she takes it around 10 AM after breakfast.  Manual BP-184/98 SPO2-98 HR-70  Patient Device- 182/109 HR-61  Let patient rest for 10 minutes and will recheck again  Manual BP-170/92 SPO2-97 HR-65  Patient device-182/112 HR- 72  Patient scheduled 2 week nurse visit for 2 hours after taking medications  Advised patient to update Home device to an [employer] CUFF     Sent to E. Philis Nettle for review

## 2023-06-07 NOTE — Patient Instructions (Signed)
Medication Instructions:  Your physician recommends that you continue on your current medications as directed. Please refer to the Current Medication list given to you today.  Labwork: None   Testing/Procedures: None   Follow-Up: Your physician recommends that you schedule a follow-up appointment in: 2 week nurse visit 2 hours after taking BP medications   Any Other Special Instructions Will Be Listed Below (If Applicable).  -Please update your Home Blood pressure device! We recommend and [employer] O[employer] ARM CUFF   If you need a refill on your cardiac medications before your next appointment, please call your pharmacy.

## 2023-06-17 ENCOUNTER — Other Ambulatory Visit: Payer: Self-pay | Admitting: Cardiology

## 2023-06-17 MED ORDER — CARVEDILOL 25 MG PO TABS: 25.0000 mg | ORAL_TABLET | Freq: Two times a day (BID) | ORAL | 3 refills | Status: DC

## 2023-06-17 NOTE — Telephone Encounter (Signed)
Refill sent.

## 2023-06-21 DIAGNOSIS — H35041 Retinal micro-aneurysms, unspecified, right eye: Secondary | ICD-10-CM | POA: Diagnosis not present

## 2023-06-21 DIAGNOSIS — H34831 Tributary (branch) retinal vein occlusion, right eye, with macular edema: Secondary | ICD-10-CM | POA: Diagnosis not present

## 2023-06-21 DIAGNOSIS — E113411 Type 2 diabetes mellitus with severe nonproliferative diabetic retinopathy with macular edema, right eye: Secondary | ICD-10-CM | POA: Diagnosis not present

## 2023-06-21 DIAGNOSIS — H348122 Central retinal vein occlusion, left eye, stable: Secondary | ICD-10-CM | POA: Diagnosis not present

## 2023-06-21 DIAGNOSIS — H353111 Nonexudative age-related macular degeneration, right eye, early dry stage: Secondary | ICD-10-CM | POA: Diagnosis not present

## 2023-06-21 DIAGNOSIS — H353124 Nonexudative age-related macular degeneration, left eye, advanced atrophic with subfoveal involvement: Secondary | ICD-10-CM | POA: Diagnosis not present

## 2023-06-21 LAB — HM DIABETES EYE EXAM

## 2023-06-22 ENCOUNTER — Ambulatory Visit: Payer: PPO | Attending: Cardiology

## 2023-06-22 ENCOUNTER — Encounter: Payer: Self-pay | Admitting: Nurse Practitioner

## 2023-06-22 ENCOUNTER — Other Ambulatory Visit: Payer: Self-pay

## 2023-06-22 ENCOUNTER — Telehealth: Payer: Self-pay | Admitting: Nurse Practitioner

## 2023-06-22 DIAGNOSIS — I739 Peripheral vascular disease, unspecified: Secondary | ICD-10-CM

## 2023-06-22 DIAGNOSIS — I10 Essential (primary) hypertension: Secondary | ICD-10-CM | POA: Diagnosis not present

## 2023-06-22 DIAGNOSIS — I480 Paroxysmal atrial fibrillation: Secondary | ICD-10-CM

## 2023-06-22 DIAGNOSIS — I2510 Atherosclerotic heart disease of native coronary artery without angina pectoris: Secondary | ICD-10-CM

## 2023-06-22 DIAGNOSIS — H43391 Other vitreous opacities, right eye: Secondary | ICD-10-CM | POA: Diagnosis not present

## 2023-06-22 DIAGNOSIS — H34831 Tributary (branch) retinal vein occlusion, right eye, with macular edema: Secondary | ICD-10-CM | POA: Diagnosis not present

## 2023-06-22 MED ORDER — OLMESARTAN MEDOXOMIL 5 MG PO TABS: 5.0000 mg | ORAL_TABLET | Freq: Every day | ORAL | 4 refills | Status: DC

## 2023-06-22 NOTE — Progress Notes (Signed)
 Chart reviewed.  BP is not at goal.  She is only on carvedilol  at maximum dose.  Previously was on ARB, and it appears she well-tolerated olmesartan  in the past.  Not sure why this was stopped.  I do not see where she has had a past allergy to this medication.  Recommend starting olmesartan  at 5 mg daily and repeat a BMET in 2 weeks.   Will send my recommendations also to DOD and attending cardiologist so they are aware.  Continue to monitor and log her BP.  Please arrange a nurse visit in 4 to 6 weeks for follow-up regarding her blood pressure. Recommend to monitor BP at home at least 2 hours after medications and sitting for 5-10 minutes.  Virginia Crate, NP

## 2023-06-22 NOTE — Progress Notes (Deleted)
 PATIENT: Virginia Huber DOB: 14-Jul-1945  REASON FOR VISIT: follow up HISTORY FROM: patient  No chief complaint on file.     HISTORY OF PRESENT ILLNESS:  06/22/23 ALL:  Virginia Huber returns for follow up for OSA on CPAP and recently diagnosed peripheral neuropathy. MRI unremarkable. NCS/EMG with Dr Onita 04/2023 showed mild axonal sensorimotor polyneuropathy. MRI spine showed multilevel degenerative disease but no significant canal stenosis.   Tremor? Amiodarone ?   05/07/2022 ALL: Virginia Huber returns for follow up for OSA on CPAP. She is doing well on therapy. She does not like to use her machine but understands medical need. She does not feel any better when using CPAP. If she travels she does not take it with her and feels that she sleeps well.   Bilateral hand tremor seems to be worsening. She is scheduled for an ablation in 07/2022 and hoping to discontinue amiodarone . She is hoping tremor improves.     05/06/2021 ALL: Virginia Huber is a 77 y.o. female here today for follow up for OSA on CPAP. She is doing well on therapy. She does feel more refreshed when using CPAP. She continues to follow closely with cardiology and ophthalmology. She is s/p eye surgery and was unable to use CPAP for about 2 weeks in October. Tremor is stable. Seems to be worse with activity. Both hands effected. Neuropathy is stable. She is followed closely by PCP.      HISTORY: (copied from Dr Dohmeier's previous note)  HPI:  Virginia Huber is a 77 y.o. female patient , seen in a RV after sleep study and for CPAP compliance. CC: Loud snoring, dependent odema, atrial fib, CHF, witnessed apnea, fatigue, TIA.  She reports her family is doing well, nobody has fallen sick during this pandemic. Her grandson got infected as an EMT, recovered well. Her short term memory has been slowly declining and she has trouble to fall asleep and stay asleep. Her sleep study was performed elsewhere on 04-12-2016. She has been doing  exquisitely well with her CPAP use 97% compliance by time and by days with an average of 8 hours and 12 minutes her CPAP is set for 10 cm pressure with 3 cm EPR and is now 77 years old.  Her residual AHI is low at 1.4 apneas per hour and she has very minimal air leakage.  This means that her mask fits well and that her apnea is well controlled I would not have to do any adjustments to that.  Any new mask or supplies go through her DME [employer].  The patient also endorsed only 2 points on the Epworth Sleepiness Scale at 18 points on the fatigue severity scale, and 1 point out of 15 on the geriatric depression score.   She is fully vaccinated against Covid.  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  She was taken recently of Metformin  for at the beginning of the year and since then had actually more trouble controlling her blood sugar and her blood pressure today.  The step to discontinue Metformin  was meant to put protect her kidney function. She is finally seen by Alyce Staff, MD - endocrinologist.     The patient continues to be a highly compliant CPAP user she has used her machine 28 over the last 30 days ending on 05 February 2019.  93% compliance for time and days with an average user time of 7 hours 27 minutes, she usually sleeps 8 hours or uses the machine 8  hours at night.  The pressure is set at 10 cmH2O her machine is provided by [employer].  She has an expiratory pressure relief setting of 3 cm and uses she has been using a full facemask with very little air leakage the 95th percentile is 1.2 L/min and a residual AHI is 1.0.  However she reports difficulties with falling asleep, staying asleep and with the mask fit by now.  She feels that she would be ready for change maybe trying a nasal mask or nasal cradle. Sometimes she drools at night.  Trazodone - 25 mg , a half tablet, she can increase to a full if needed. I will order a nasal mask, such as the wisp.       REVIEW OF SYSTEMS: Out of a complete 14 system review of symptoms, the patient complains only of the following symptoms, neuropathy, tremor, and all other reviewed systems are negative.  ESS: 3/24  ALLERGIES: Allergies  Allergen Reactions   Penicillins Hives    Has patient had a PCN reaction causing immediate rash, facial/tongue/throat swelling, SOB or lightheadedness with hypotension: Yes Has patient had a PCN reaction causing severe rash involving mucus membranes or skin necrosis: No Has patient had a PCN reaction that required hospitalization No Has patient had a PCN reaction occurring within the last 10 years: No If all of the above answers are NO, then may proceed with Cephalosporin use.   Percocet [Oxycodone-Acetaminophen ] Nausea And Vomiting    HOME MEDICATIONS: Outpatient Medications Prior to Visit  Medication Sig Dispense Refill   acetaminophen  (TYLENOL ) 500 MG tablet Take 1,000 mg by mouth every 8 (eight) hours as needed for headache, moderate pain or mild pain.     ALPRAZolam  (XANAX ) 0.25 MG tablet Take 0.5-1 tablets (0.125-0.25 mg total) by mouth at bedtime as needed for sleep. 30 tablet 0   atorvastatin  (LIPITOR ) 80 MG tablet TAKE ONE TABLET BY MOUTH EVERY DAY AT 6:00PM (Patient taking differently: Take 80 mg by mouth daily.) 90 tablet 3   calcitRIOL  (ROCALTROL ) 0.25 MCG capsule Take 0.25 mcg by mouth every Monday, Wednesday, and Friday.     carvedilol  (COREG ) 25 MG tablet Take 1 tablet (25 mg total) by mouth 2 (two) times daily. 60 tablet 3   Cholecalciferol  50 MCG (2000 UT) TABS Take 4,000 Units by mouth at bedtime.     clopidogrel  (PLAVIX ) 75 MG tablet TAKE ONE TABLET (75MG  TOTAL) BY MOUTH DAILY (Patient taking differently: Take 75 mg by mouth daily.) 90 tablet 3   Continuous Blood Gluc Receiver (FREESTYLE LIBRE 2 READER) DEVI 1 each by Does not apply route daily. 1 each 0   Continuous Glucose Sensor (FREESTYLE LIBRE 2 SENSOR) MISC USE ONE FOR FOURTEEN DAYS 6 each 3    ELIQUIS  5 MG TABS tablet TAKE ONE TABLET BY MOUTH TWICE DAILY 60 tablet 5   furosemide  (LASIX ) 40 MG tablet Take 40 mg by mouth daily as needed for fluid or edema.     glucose blood (FREESTYLE PRECISION NEO TEST) test strip Use as instructed 100 each 3   Insulin  NPH, Human,, Isophane, (NOVOLIN  N FLEXPEN) 100 UNIT/ML Kiwkpen Inject 15 units subcutaneously in the morning & inject 4 units subcutaneously at night.     levothyroxine  (SYNTHROID ) 112 MCG tablet TAKE ONE TABLET BY MOUTH ONCE DAILY 90 tablet 2   nitroGLYCERIN  (NITROSTAT ) 0.4 MG SL tablet Place 1 tablet (0.4 mg total) under the tongue every 5 (five) minutes x 3 doses as needed for chest pain (If not relief  after 3rd dose, call 911 or go to ED). 25 tablet 0   rOPINIRole  (REQUIP ) 0.5 MG tablet Take 1 tablet (0.5 mg total) by mouth at bedtime. 30 tablet 5   Semaglutide , 1 MG/DOSE, (OZEMPIC , 1 MG/DOSE,) 4 MG/3ML SOPN Inject 1 mg into the skin once a week. 3 mL 2   No facility-administered medications prior to visit.    PAST MEDICAL HISTORY: Past Medical History:  Diagnosis Date   (HFpEF) heart failure with preserved ejection fraction (HCC)    Advanced nonexudative age-related macular degeneration of left eye with subfoveal involvement 06/26/2020   Ongoing, accounts for acuity   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 h/o PAF/flutter during admission in 2013 for PNA. b. PAF during adm for NSTEMI 07/2015, subsequent paroxysms since then.   B12 deficiency anemia    Branch retinal vein occlusion with macular edema of right eye 10/11/2019   Chronic renal failure, stage 3b (HCC) 09/23/2017   Coronary artery disease 11/30/2014   a. remote MI. b. h/o PTCA with scoring balloon to OM1 11/2014. c. NSTEMI 03/2015 s/p DES to prox-mid Cx. d. NSTEMI 07/2015 s/p scoring balloon/PTCA/DES to dRCA with PAF during that admission   Cutaneous lupus erythematosus    Early stage nonexudative  age-related macular degeneration of right eye 02/27/2020   Essential hypertension    GERD (gastroesophageal reflux disease)    History of blood transfusion 1980's   2nd surgical procedures   Hypercholesteremia    Hypothyroidism    Myocardial infarction (HCC) 02/2012   NSTEMI (non-ST elevated myocardial infarction) (HCC) 04/02/2015   OSA (obstructive sleep apnea) 05/13/2016   Ovarian tumor    PAD (peripheral artery disease) (HCC)    a. s/p LE angio 2015; followed by Dr. Darron - managed medically.   Pericardial effusion    a. 06/2016 after ppm - s/p pericardiocentesis.   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 s/p Medtronic PPM 06/2016, c/b lead perf/pericardial effusion.   TIA (transient ischemic attack)    Type 2 diabetes with nephropathy (HCC) 02/29/2012    PAST SURGICAL HISTORY: Past Surgical History:  Procedure Laterality Date   A-FLUTTER ABLATION N/A 12/08/2022   Procedure: A-FLUTTER ABLATION;  Surgeon: Inocencio Soyla Lunger, MD;  Location: [facility];  Service: Cardiovascular;  Laterality: N/A;   ABDOMINAL AORTAGRAM N/A 01/03/2014   Procedure: ABDOMINAL EZELLA;  Surgeon: Deatrice DELENA Darron, MD;  Location: [facility];  Service: Cardiovascular;  Laterality: N/A;   ABDOMINAL HYSTERECTOMY  06/22/1970   partial   APPENDECTOMY  02/20/1969   ATRIAL FIBRILLATION ABLATION N/A 08/14/2022   Procedure: ATRIAL FIBRILLATION ABLATION;  Surgeon: Inocencio Soyla Lunger, MD;  Location: [facility];  Service: Cardiovascular;  Laterality: N/A;   ATRIAL FIBRILLATION ABLATION N/A 12/08/2022   Procedure: ATRIAL FIBRILLATION  ABLATION;  Surgeon: Inocencio Soyla Lunger, MD;  Location: [facility];  Service: Cardiovascular;  Laterality: N/A;   CARDIAC CATHETERIZATION  06/22/2006   Tiny OM-2 with 90% narrowing. Med tx.   CARDIAC CATHETERIZATION N/A 11/30/2014   Procedure: Left Heart Cath and Coronary  Angiography;  Surgeon: Debby DELENA Sor, MD; LAD 20%, CFX 50%, OM1 95%, right PLB 30%, LV normal    CARDIAC CATHETERIZATION N/A 11/30/2014   Procedure: Coronary Balloon Angioplasty;  Surgeon: Debby DELENA Sor, MD;  Angiosculpt scoring balloon and PTCA to the OM1 reducing stenosis from 95% to less than 10%   CARDIAC CATHETERIZATION N/A 04/03/2015   Procedure: Left Heart Cath and Coronary Angiography;  Surgeon: Toribio JONELLE Fuel, MD; dLAD 50%, CFX 90%, OM1 100%, PLA 15%, LVEDP 13     CARDIAC CATHETERIZATION N/A 04/03/2015   Procedure: Coronary Stent Intervention;  Surgeon: Ozell Fell, MD; 3.0x18 mm Xience DES to the CFX     CARDIAC CATHETERIZATION N/A 08/02/2015   Procedure: Left Heart Cath and Coronary Angiography;  Surgeon: Debby DELENA Sor, MD;  Location: [facility] INVASIVE CV LAB;  Service: Cardiovascular;  Laterality: N/A;   CARDIAC CATHETERIZATION N/A 08/02/2015   Procedure: Coronary Stent Intervention;  Surgeon: Debby DELENA Sor, MD;  [facility]: [facility];  Service: Cardiovascular;  Laterality: N/A;   CARDIAC CATHETERIZATION N/A 06/25/2016   Procedure: Pericardiocentesis;  Surgeon: Will Gladis Norton, MD;  Location: [facility];  Service: Cardiovascular;  Laterality: N/A;   cardiac stents     CARDIOVERSION N/A 12/15/2017   Procedure: CARDIOVERSION;  Surgeon: Alveta Aleene PARAS, MD;  Location: [facility] ENDOSCOPY;  Service: Cardiovascular;  Laterality: N/A;   CARDIOVERSION N/A 05/12/2022   Procedure: CARDIOVERSION;  Surgeon: Shlomo Wilbert JONELLE, MD;  Location: [facility] ENDOSCOPY;  Service: Cardiovascular;  Laterality: N/A;   CARDIOVERSION N/A 09/30/2022   Procedure: CARDIOVERSION;  Surgeon: Francyne Headland, MD;  Location: [facility]  INV[facility] CV [facility];  Service: Cardiovascular;  Laterality: N/A;   CARDIOVERSION N/A 10/29/2022   Procedure: CARDIOVERSION;  Surgeon: Shlomo Wilbert JONELLE, MD;  Location: [facility];  Service: Cardiovascular;  Laterality: N/A;   CHOLECYSTECTOMY OPEN  02/20/1989   COLONOSCOPY  06/23/2003   Dr. Golda: pancolonic divericula, polyp, path unknown currently   COLONOSCOPY  06/22/2010   Dr. Harvey: Normal TI, scattered diverticula in entire colon, small internal hemorrhoids, normal colon biopsies. Colonoscopy in 5-10 years.    COLONOSCOPY WITH PROPOFOL  N/A 06/02/2021   pancolonic diverticulosis. Two 4-6 mm polyps in transverse colon. Sessile serrated and hyperplastic. 5 year surveillance if benefits outweigh the risks.   COLOSTOMY  05/23/1979   COLOSTOMY REVERSAL  11/21/1979   EP IMPLANTABLE DEVICE N/A 06/25/2016   Procedure: Lead Revision/Repair;  Surgeon: Will Gladis Norton, MD;  Location: [facility];  Service: Cardiovascular;  Laterality: N/A;   EP IMPLANTABLE DEVICE N/A 06/25/2016   Procedure: Pacemaker Implant;  Surgeon: Will Gladis Norton, MD;  Location: [facility];  Service: Cardiovascular;  Laterality: N/A;   EXCISIONAL HEMORRHOIDECTOMY  02/20/1969   EYE SURGERY Left 06/22/1998   branch vein occlusion   EYE SURGERY Left ~ 2001   smoothed out wrinkle   LEFT HEART CATH AND CORONARY ANGIOGRAPHY N/A 05/11/2022   Procedure: LEFT HEART CATH AND CORONARY ANGIOGRAPHY;  Surgeon: Verlin Lonni BIRCH, MD;  Location: [facility];  Service: Cardiovascular;  Laterality: N/A;   LEFT OOPHORECTOMY  05/23/1979   nicked bowel, peritonitis, colostomy; colostomy reversed 1981    LOWER EXTREMITY ANGIOGRAM N/A 01/03/2014   Procedure: LOWER EXTREMITY ANGIOGRAM;  Surgeon: Deatrice DELENA Cage, MD;  Location: [facility] CATH [facility];  Service: Cardiovascular;  Laterality: N/A;   Nuclear med stress test  10/21/2011   Small area of mild ischemia inferoapically.   PARTIAL HYSTERECTOMY  02/20/1969  left ovaries, then ovaries removed later due tumors    POLYPECTOMY  06/02/2021   Procedure: POLYPECTOMY;  Surgeon: Cindie Carlin POUR, DO;  Location: [facility];  Service: Endoscopy;;   right eye surgery  03/2021   RIGHT OOPHORECTOMY  02/20/1969   TEE WITHOUT CARDIOVERSION N/A 05/12/2022   Procedure: TRANSESOPHAGEAL ECHOCARDIOGRAM (TEE);  Surgeon: Shlomo Wilbert SAUNDERS, MD;  Location: [facility] ENDOSCOPY;  Service: Cardiovascular;  Laterality: N/A;   TEE WITHOUT CARDIOVERSION N/A 08/14/2022   Procedure: TRANSESOPHAGEAL ECHOCARDIOGRAM;  Surgeon: Inocencio Soyla Lunger, MD;  Location: [facility] INVASIVE CV LAB;  Service: Cardiovascular;  Laterality: N/A;   TEE WITHOUT CARDIOVERSION N/A 12/08/2022   Procedure: TRANSESOPHAGEAL ECHOCARDIOGRAM;  Surgeon: Inocencio Soyla Lunger, MD;  Location: [facility] INVASIVE CV LAB;  Service: Cardiovascular;  Laterality: N/A;    FAMILY HISTORY: Family History  Problem Relation Age of Onset   Heart disease Mother        deceased   Heart disease Father        deceased, heart disease   Diabetes Brother    Heart disease Brother    Thyroid  disease Brother    Heart disease Sister    Heart disease Brother    Thyroid  disease Brother    Lupus Daughter    Colon cancer Neg Hx     SOCIAL HISTORY: Social History   Socioeconomic History   Marital status: Married    Spouse name: Not on file   Number of children: Not on file   Years of education: Not on file   Highest education level: Not on file  Occupational History   Occupation: Retired    Associate Professor: RETIRED    Comment: insurance billing  Tobacco Use   Smoking status: Never    Passive exposure: Never   Smokeless tobacco: Never   Tobacco comments:    Never smoke 10/08/22  Vaping Use   Vaping status: Never Used  Substance and Sexual Activity   Alcohol use: No    Alcohol/week: 0.0 standard drinks of alcohol   Drug use: No   Sexual activity: Never    Birth control/protection: Surgical    Comment: hyst  Other Topics Concern    Not on file  Social History Narrative   Not on file   Social Drivers of Health   Financial Resource Strain: Not on file  Food Insecurity: No Food Insecurity (04/13/2023)   Hunger Vital Sign    Worried About Running Out of Food in the Last Year: Never true    Ran Out of Food in the Last Year: Never true  Transportation Needs: No Transportation Needs (04/13/2023)   PRAPARE - Administrator, Civil Service (Medical): No    Lack of Transportation (Non-Medical): No  Physical Activity: Not on file  Stress: Not on file  Social Connections: Not on file  Intimate Partner Violence: Not At Risk (04/13/2023)   Humiliation, Afraid, Rape, and Kick questionnaire    Fear of Current or Ex-Partner: No    Emotionally Abused: No    Physically Abused: No    Sexually Abused: No     PHYSICAL EXAM  There were no vitals filed for this visit.   There is no height or weight on file to calculate BMI.  Generalized: Well developed, in no acute distress  Cardiology: normal rate and rhythm, no murmur noted Respiratory: clear to auscultation bilaterally  Neurological examination  Mentation: Alert oriented to time, place, history taking. Follows all commands speech and language fluent Cranial nerve II-XII: Pupils  were equal round reactive to light. Extraocular movements were full, visual field were full  Motor: The motor testing reveals 5 over 5 strength of all 4 extremities. Good symmetric motor tone is noted throughout. No tremor noted  Gait and station: Gait is normal.    DIAGNOSTIC DATA (LABS, IMAGING, TESTING) - I reviewed patient records, labs, notes, testing and imaging myself where available.      No data to display           Lab Results  Component Value Date   WBC 8.9 04/13/2023   HGB 12.0 04/13/2023   HCT 37.0 04/13/2023   MCV 94.6 04/13/2023   PLT 200 04/13/2023      Component Value Date/Time   NA 135 04/14/2023 0419   NA 143 03/23/2023 1135   K 4.8 04/14/2023  0419   CL 104 04/14/2023 0419   CO2 23 04/14/2023 0419   GLUCOSE 183 (H) 04/14/2023 0419   BUN 24 (H) 04/14/2023 0419   BUN 19 03/23/2023 1135   CREATININE 1.31 (H) 04/14/2023 0419   CREATININE 1.11 (H) 12/31/2017 0850   CALCIUM  8.4 (L) 04/14/2023 0419   PROT 6.1 03/23/2023 1135   ALBUMIN 4.1 03/23/2023 1135   AST 22 03/23/2023 1135   ALT 22 03/23/2023 1135   ALKPHOS 89 03/23/2023 1135   BILITOT 0.6 03/23/2023 1135   GFRNONAA 42 (L) 04/14/2023 0419   GFRNONAA 43 (L) 08/05/2017 0807   GFRAA 39 (L) 08/26/2019 0901   GFRAA 50 (L) 08/05/2017 0807   Lab Results  Component Value Date   CHOL 96 05/10/2022   HDL 30 (L) 05/10/2022   LDLCALC 35 05/10/2022   TRIG 156 (H) 05/10/2022   CHOLHDL 3.2 05/10/2022   Lab Results  Component Value Date   HGBA1C 6.4 (A) 02/01/2023   Lab Results  Component Value Date   VITAMINB12 401 03/23/2023   Lab Results  Component Value Date   TSH 1.170 03/23/2023     ASSESSMENT AND PLAN 77 y.o. year old female  has a past medical history of (HFpEF) heart failure with preserved ejection fraction (HCC), Advanced nonexudative age-related macular degeneration of left eye with subfoveal involvement (06/26/2020), Amiodarone  induced neuropathy (HCC) (12/08/2017), Arthritis, Atrial fibrillation and flutter (HCC), B12 deficiency anemia, Branch retinal vein occlusion with macular edema of right eye (10/11/2019), Chronic renal failure, stage 3b (HCC) (09/23/2017), Coronary artery disease (11/30/2014), Cutaneous lupus erythematosus, Early stage nonexudative age-related macular degeneration of right eye (02/27/2020), Essential hypertension, GERD (gastroesophageal reflux disease), History of blood transfusion (1980's), Hypercholesteremia, Hypothyroidism, Myocardial infarction (HCC) (02/2012), NSTEMI (non-ST elevated myocardial infarction) (HCC) (04/02/2015), OSA (obstructive sleep apnea) (05/13/2016), Ovarian tumor, PAD (peripheral artery disease) (HCC), Pericardial  effusion, Posterior vitreous detachment of right eye (10/11/2019), Posterior vitreous detachment of right eye (10/11/2019), Presence of permanent cardiac pacemaker, Retinal microaneurysm of right eye (10/11/2019), S/P pericardiocentesis (06/28/2016), 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 here with     ICD-10-CM   1. Amiodarone  induced neuropathy (HCC)  G62.0    T46.2X5A     2. Drug-induced tremor  G25.1     3. OSA on CPAP  G47.33         Virginia Huber is doing well on CPAP therapy. Compliance report reveals acceptable compliance. She was encouraged to continue using CPAP nightly and for greater than 4 hours each night.  We will update supply orders as indicated. I will update HST and order new CPAP pending results. Risks of untreated sleep apnea review and education materials provided. She will continue to follow up with care team. Tremor may be a little worse. She will follow up if no improvement off amiodarone . Healthy lifestyle habits encouraged. She will follow up in 31-90 days following set up of new CPAP. She verbalizes understanding and agreement with this plan.    No orders of the defined types were placed in this encounter.     No orders of the defined types were placed in this encounter.      Greig Forbes, FNP-C 06/22/2023, 9:13 AM [facility] [address] [place], [place] [address] [phone]

## 2023-06-22 NOTE — Telephone Encounter (Signed)
 Patient informed and verbalized understanding of plan. Sending to schedule nurse visit for BP check after starting new medication in 4-6 weeks on 08/02/23 @1PM 

## 2023-06-22 NOTE — Progress Notes (Signed)
 Patient here today for nurse visit BP check 2 hours after taking BP medications per elizabeth peck Patient presents well with no SOB,CP or weakness  Patient brought home readings gave to [facility] to scan into patient chart  Patient took carvedilol  25 Mg this morning at 8:30 patient currently takes this BID  manual BP 164/88 HR 66 Patient new omron cuff 161/96 69  Sent to peck for review

## 2023-06-22 NOTE — Telephone Encounter (Signed)
-----   Message from Sharlene Dory sent at 06/22/2023  1:05 PM EST ----- Please see my recommendations.  Thanks!  Best,  Sharlene Dory, NP

## 2023-06-24 ENCOUNTER — Encounter: Payer: Self-pay | Admitting: Internal Medicine

## 2023-06-24 ENCOUNTER — Other Ambulatory Visit: Payer: Self-pay

## 2023-06-24 DIAGNOSIS — I4891 Unspecified atrial fibrillation: Secondary | ICD-10-CM

## 2023-06-24 DIAGNOSIS — G620 Drug-induced polyneuropathy: Secondary | ICD-10-CM

## 2023-06-24 MED ORDER — ALPRAZOLAM 0.25 MG PO TABS: 0.1250 mg | ORAL_TABLET | Freq: Every evening | ORAL | 0 refills | Status: DC | PRN

## 2023-06-24 MED ORDER — ALPRAZOLAM 0.25 MG PO TABS: 0.2500 mg | ORAL_TABLET | Freq: Every evening | ORAL | 0 refills | Status: DC | PRN

## 2023-06-24 NOTE — Addendum Note (Signed)
 Addended by: Melvyn Novas on: 06/24/2023 04:02 PM   Modules accepted: Orders

## 2023-06-27 ENCOUNTER — Telehealth: Payer: Self-pay | Admitting: Neurology

## 2023-06-27 NOTE — Telephone Encounter (Signed)
 Phone rep called, and informed pt that due to inclement weather her appointment would be cancelled, and that within the week she will be called to be rescheduled, pt okayed.

## 2023-06-28 ENCOUNTER — Ambulatory Visit: Payer: PPO | Admitting: Family Medicine

## 2023-06-28 DIAGNOSIS — G4733 Obstructive sleep apnea (adult) (pediatric): Secondary | ICD-10-CM

## 2023-06-28 DIAGNOSIS — G251 Drug-induced tremor: Secondary | ICD-10-CM

## 2023-06-28 DIAGNOSIS — G620 Drug-induced polyneuropathy: Secondary | ICD-10-CM

## 2023-06-30 DIAGNOSIS — G4733 Obstructive sleep apnea (adult) (pediatric): Secondary | ICD-10-CM | POA: Diagnosis not present

## 2023-07-07 ENCOUNTER — Ambulatory Visit (INDEPENDENT_AMBULATORY_CARE_PROVIDER_SITE_OTHER): Payer: PPO

## 2023-07-07 DIAGNOSIS — I4891 Unspecified atrial fibrillation: Secondary | ICD-10-CM

## 2023-07-07 LAB — CUP PACEART REMOTE DEVICE CHECK
Battery Remaining Longevity: 28 mo
Battery Voltage: 2.95 V
Brady Statistic AP VP Percent: 0.18 %
Brady Statistic AP VS Percent: 3.25 %
Brady Statistic AS VP Percent: 18.65 %
Brady Statistic AS VS Percent: 77.93 %
Brady Statistic RA Percent Paced: 2.76 %
Brady Statistic RV Percent Paced: 18.3 %
Date Time Interrogation Session: 20250115093704
Implantable Lead Connection Status: 753985
Implantable Lead Connection Status: 753985
Implantable Lead Implant Date: 20180104
Implantable Lead Implant Date: 20180104
Implantable Lead Location: 753859
Implantable Lead Location: 753860
Implantable Lead Model: 5076
Implantable Lead Model: 5076
Implantable Pulse Generator Implant Date: 20180104
Lead Channel Impedance Value: 304 Ohm
Lead Channel Impedance Value: 361 Ohm
Lead Channel Impedance Value: 380 Ohm
Lead Channel Impedance Value: 494 Ohm
Lead Channel Pacing Threshold Amplitude: 0.75 V
Lead Channel Pacing Threshold Amplitude: 1.125 V
Lead Channel Pacing Threshold Pulse Width: 0.4 ms
Lead Channel Pacing Threshold Pulse Width: 0.4 ms
Lead Channel Sensing Intrinsic Amplitude: 16 mV
Lead Channel Sensing Intrinsic Amplitude: 16 mV
Lead Channel Sensing Intrinsic Amplitude: 2.375 mV
Lead Channel Sensing Intrinsic Amplitude: 2.375 mV
Lead Channel Setting Pacing Amplitude: 2 V
Lead Channel Setting Pacing Amplitude: 2.5 V
Lead Channel Setting Pacing Pulse Width: 0.4 ms
Lead Channel Setting Sensing Sensitivity: 2.8 mV
Zone Setting Status: 755011

## 2023-07-08 DIAGNOSIS — I2510 Atherosclerotic heart disease of native coronary artery without angina pectoris: Secondary | ICD-10-CM | POA: Diagnosis not present

## 2023-07-08 DIAGNOSIS — I739 Peripheral vascular disease, unspecified: Secondary | ICD-10-CM | POA: Diagnosis not present

## 2023-07-08 DIAGNOSIS — I480 Paroxysmal atrial fibrillation: Secondary | ICD-10-CM | POA: Diagnosis not present

## 2023-07-09 LAB — BASIC METABOLIC PANEL
BUN/Creatinine Ratio: 12 (ref 12–28)
BUN: 15 mg/dL (ref 8–27)
CO2: 25 mmol/L (ref 20–29)
Calcium: 8.7 mg/dL (ref 8.7–10.3)
Chloride: 104 mmol/L (ref 96–106)
Creatinine, Ser: 1.21 mg/dL — ABNORMAL HIGH (ref 0.57–1.00)
Glucose: 140 mg/dL — ABNORMAL HIGH (ref 70–99)
Potassium: 4.1 mmol/L (ref 3.5–5.2)
Sodium: 141 mmol/L (ref 134–144)
eGFR: 46 mL/min/{1.73_m2} — ABNORMAL LOW (ref >59)

## 2023-07-19 DIAGNOSIS — D631 Anemia in chronic kidney disease: Secondary | ICD-10-CM | POA: Diagnosis not present

## 2023-07-19 DIAGNOSIS — E211 Secondary hyperparathyroidism, not elsewhere classified: Secondary | ICD-10-CM | POA: Diagnosis not present

## 2023-07-19 DIAGNOSIS — R809 Proteinuria, unspecified: Secondary | ICD-10-CM | POA: Diagnosis not present

## 2023-07-19 DIAGNOSIS — N189 Chronic kidney disease, unspecified: Secondary | ICD-10-CM | POA: Diagnosis not present

## 2023-07-26 DIAGNOSIS — E782 Mixed hyperlipidemia: Secondary | ICD-10-CM | POA: Diagnosis not present

## 2023-07-26 DIAGNOSIS — E119 Type 2 diabetes mellitus without complications: Secondary | ICD-10-CM | POA: Diagnosis not present

## 2023-07-26 DIAGNOSIS — Z6823 Body mass index (BMI) 23.0-23.9, adult: Secondary | ICD-10-CM | POA: Diagnosis not present

## 2023-07-26 DIAGNOSIS — Z1331 Encounter for screening for depression: Secondary | ICD-10-CM | POA: Diagnosis not present

## 2023-07-26 DIAGNOSIS — Z0001 Encounter for general adult medical examination with abnormal findings: Secondary | ICD-10-CM | POA: Diagnosis not present

## 2023-07-26 DIAGNOSIS — E039 Hypothyroidism, unspecified: Secondary | ICD-10-CM | POA: Diagnosis not present

## 2023-07-26 DIAGNOSIS — E1122 Type 2 diabetes mellitus with diabetic chronic kidney disease: Secondary | ICD-10-CM | POA: Diagnosis not present

## 2023-07-26 DIAGNOSIS — I482 Chronic atrial fibrillation, unspecified: Secondary | ICD-10-CM | POA: Diagnosis not present

## 2023-07-27 ENCOUNTER — Encounter (HOSPITAL_COMMUNITY): Payer: Self-pay | Admitting: Internal Medicine

## 2023-07-27 ENCOUNTER — Ambulatory Visit (HOSPITAL_COMMUNITY)
Admission: RE | Admit: 2023-07-27 | Discharge: 2023-07-27 | Disposition: A | Discharge status: Home / Self Care | Payer: PPO | Source: Ambulatory Visit | Attending: Internal Medicine | Admitting: Internal Medicine

## 2023-07-27 ENCOUNTER — Other Ambulatory Visit (HOSPITAL_COMMUNITY): Payer: Self-pay | Admitting: Internal Medicine

## 2023-07-27 VITALS — BP 148/98 | HR 75 | Ht 63.0 in | Wt 130.6 lb

## 2023-07-27 DIAGNOSIS — I4819 Other persistent atrial fibrillation: Secondary | ICD-10-CM | POA: Diagnosis not present

## 2023-07-27 DIAGNOSIS — I4892 Unspecified atrial flutter: Secondary | ICD-10-CM | POA: Diagnosis not present

## 2023-07-27 DIAGNOSIS — I2510 Atherosclerotic heart disease of native coronary artery without angina pectoris: Secondary | ICD-10-CM | POA: Diagnosis not present

## 2023-07-27 DIAGNOSIS — Z955 Presence of coronary angioplasty implant and graft: Secondary | ICD-10-CM | POA: Insufficient documentation

## 2023-07-27 DIAGNOSIS — Z794 Long term (current) use of insulin: Secondary | ICD-10-CM | POA: Diagnosis not present

## 2023-07-27 DIAGNOSIS — D6869 Other thrombophilia: Secondary | ICD-10-CM | POA: Insufficient documentation

## 2023-07-27 DIAGNOSIS — I5032 Chronic diastolic (congestive) heart failure: Secondary | ICD-10-CM | POA: Insufficient documentation

## 2023-07-27 DIAGNOSIS — E1122 Type 2 diabetes mellitus with diabetic chronic kidney disease: Secondary | ICD-10-CM | POA: Insufficient documentation

## 2023-07-27 DIAGNOSIS — N1832 Chronic kidney disease, stage 3b: Secondary | ICD-10-CM | POA: Diagnosis not present

## 2023-07-27 DIAGNOSIS — I130 Hypertensive heart and chronic kidney disease with heart failure and stage 1 through stage 4 chronic kidney disease, or unspecified chronic kidney disease: Secondary | ICD-10-CM | POA: Insufficient documentation

## 2023-07-27 DIAGNOSIS — E1151 Type 2 diabetes mellitus with diabetic peripheral angiopathy without gangrene: Secondary | ICD-10-CM | POA: Insufficient documentation

## 2023-07-27 DIAGNOSIS — I495 Sick sinus syndrome: Secondary | ICD-10-CM | POA: Diagnosis not present

## 2023-07-27 DIAGNOSIS — Z79899 Other long term (current) drug therapy: Secondary | ICD-10-CM | POA: Diagnosis not present

## 2023-07-27 DIAGNOSIS — Z7985 Long-term (current) use of injectable non-insulin antidiabetic drugs: Secondary | ICD-10-CM | POA: Diagnosis not present

## 2023-07-27 DIAGNOSIS — G4733 Obstructive sleep apnea (adult) (pediatric): Secondary | ICD-10-CM | POA: Insufficient documentation

## 2023-07-27 LAB — BASIC METABOLIC PANEL
Anion gap: 11 (ref 5–15)
BUN: 22 mg/dL (ref 8–23)
CO2: 24 mmol/L (ref 22–32)
Calcium: 8.8 mg/dL — ABNORMAL LOW (ref 8.9–10.3)
Chloride: 104 mmol/L (ref 98–111)
Creatinine, Ser: 1.55 mg/dL — ABNORMAL HIGH (ref 0.44–1.00)
GFR, Estimated: 34 mL/min — ABNORMAL LOW (ref >60)
Glucose, Bld: 241 mg/dL — ABNORMAL HIGH (ref 70–99)
Potassium: 3.6 mmol/L (ref 3.5–5.1)
Sodium: 139 mmol/L (ref 135–145)

## 2023-07-27 LAB — CBC
HCT: 39.3 % (ref 36.0–46.0)
Hemoglobin: 12.2 g/dL (ref 12.0–15.0)
MCH: 29.3 pg (ref 26.0–34.0)
MCHC: 31 g/dL (ref 30.0–36.0)
MCV: 94.2 fL (ref 80.0–100.0)
Platelets: 201 10*3/uL (ref 150–400)
RBC: 4.17 MIL/uL (ref 3.87–5.11)
RDW: 16 % — ABNORMAL HIGH (ref 11.5–15.5)
WBC: 7.9 10*3/uL (ref 4.0–10.5)
nRBC: 0 % (ref 0.0–0.2)

## 2023-07-27 NOTE — Patient Instructions (Addendum)
 Please hold Ozempic  for now !  Cardioversion scheduled for February. 24th 2025   - Arrive at the [facility] and go to admitting at 9:30am   - Do not eat or drink anything after midnight the night prior to your procedure.   - Take all your morning medication (except diabetic medications) with a sip of water prior to arrival.  - You will not be able to drive home after your procedure.    - Do NOT miss any doses of your blood thinner - if you should miss a dose please notify our office immediately.   - If you feel as if you go back into normal rhythm prior to scheduled cardioversion, please notify our office immediately.   If your procedure is canceled in the cardioversion suite you will be charged a cancellation fee.    Hold below medications 7 days prior to scheduled procedure/anesthesia.  Restart medication on the normal dosing day after scheduled procedure/anesthesia  Dulaglutide  (Trulicity ) Exenatide extended release (Bydureon bcise) Semaglutide  (Ozempic ) (WEGOVY )  Tirzepatide (Mounjaro)     Hold below medications 72 hours prior to scheduled procedure/anesthesia. Restart medication on the following day after scheduled procedure/anesthesia Bexagliflozin (Brenzavvy) Canagliflozin (Invokana) Canagliflozin/metformin  (Invokamet/Invokamet XR) Dapagliflozin Pauletta) Dapagliflozin/metformin  (Xigduo XR) Dapagliflozin/saxagliptin Delanna) Empagliflozin (Jardiance) Empagliflozin/linagliptin (Glyxambi) Empagliflozin/linagliptin/metformin  (Trijardy XR) Empagliflozin/metformin  (Synjardy/Synjardy XR) Ertugliflozin (Steglatro) Ertugliflozin/metformin  (Segluromet) Ertugliflozin/sitagliptin (Steglujan)    Hold below medications 24 hours prior to scheduled procedure/anesthesia.   Restart medication on the following day after scheduled procedure/anesthesia   Exenatide (Byetta)  Liraglutide (Victoza, Saxenda)  Lixisenatide (Adlyxin)  Semaglutide  (Rybelsus ) Polyethylene  Glycol Loxenatide        For those patients who have a scheduled procedure/anesthesia on the same day of the week as their dose, hold the medication on the day of surgery.  They can take their scheduled dose the week before.  **Patients on the above medications scheduled for elective procedures that have not held the medication for the appropriate amount of time are at risk of cancellation or change in the anesthetic plan.

## 2023-07-27 NOTE — H&P (View-Only) (Signed)
 Primary Care Physician: Marvine Rush, MD Primary Cardiologist: Dr Debera  Primary Electrophysiologist: Dr Inocencio  Referring Physician: Dr Inocencio [name] is a 78 y.o. female with a history of CAD, PAD, HFrEF, HTN, SSS s/p PPM, OSA, atrial flutter, atrial fibrillation who presents for follow up in the [facility]. She was hospitalized December 2017 with atrial fibrillation rapid rates. She was cardioverted to junctional rhythm. She is now status post Medtronic dual-chamber pacemaker. She has been maintained on amiodarone  but has continued to have episodes of rapid afib requiring hospitalization. She is now s/p afib and flutter ablation with Dr Inocencio. Patient is on Eliquis  for a CHADS2VASC score of 6. She did have some oozing from her groin sites and was kept two additional nights post ablation.   On follow up today, patient is s/p DCCV on 09/30/22. Unfortunately, her PPM alerted that she was back out of rhythm 3 days later. She is fatigued, especially with exertion. No bleeding issues on anticoagulation. Of note, her daughter was just diagnosed with lung cancer.   On follow up 01/05/23, she is currently in NSR. Patient is s/p Afib ablation on 12/08/22 by Dr. Inocencio. No episodes of Afib since ablation. Intermittent dizziness since last week; no syncope. Admits to maybe not being consistent with hydration. No chest pain, SOB, or trouble swallowing. Leg sites healed without issue. No missed doses of anticoagulant.  On follow up 07/27/23, she is currently in atrial flutter. Device interrogation on 1/15 showed persistent Afib. She missed a dose of Eliquis  on Sunday night. She feels like she has no energy when out of rhythm.  Today, she denies symptoms of orthopnea, PND, lower extremity edema, dizziness, presyncope, syncope, snoring, daytime somnolence, bleeding, or neurologic sequela. The patient is tolerating medications without difficulties and is otherwise  without complaint today.   Atrial Fibrillation Risk Factors:  she does have symptoms or diagnosis of sleep apnea. she is compliant with CPAP therapy. she does not have a history of rheumatic fever.   she has a BMI of Body mass index is 23.13 kg/m.SABRA Filed Weights   07/27/23 1104  Weight: 59.2 kg     Family History  Problem Relation Age of Onset   Heart disease Mother        deceased   Heart disease Father        deceased, heart disease   Diabetes Brother    Heart disease Brother    Thyroid  disease Brother    Heart disease Sister    Heart disease Brother    Thyroid  disease Brother    Lupus Daughter    Colon cancer Neg Hx     Atrial Fibrillation Management history:  Previous antiarrhythmic drugs: amiodarone   Previous cardioversions: 2019, 05/12/22, 09/30/22 Previous ablations: 08/14/22, 12/08/22 CHADS2VASC score: 6 Anticoagulation history: Eliquis    Past Medical History:  Diagnosis Date   (HFpEF) heart failure with preserved ejection fraction (HCC)    Advanced nonexudative age-related macular degeneration of left eye with subfoveal involvement 06/26/2020   Ongoing, accounts for acuity   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 h/o PAF/flutter during admission in 2013 for PNA. b. PAF during adm for NSTEMI 07/2015, subsequent paroxysms since then.   B12 deficiency anemia    Branch retinal vein occlusion with macular edema of right eye 10/11/2019   Chronic renal failure, stage 3b (HCC) 09/23/2017   Coronary artery  disease 11/30/2014   a. remote MI. b. h/o PTCA with scoring balloon to OM1 11/2014. c. NSTEMI 03/2015 s/p DES to prox-mid Cx. d. NSTEMI 07/2015 s/p scoring balloon/PTCA/DES to dRCA with PAF during that admission   Cutaneous lupus erythematosus    Early stage nonexudative age-related macular degeneration of right eye 02/27/2020   Essential hypertension    GERD (gastroesophageal reflux disease)     History of blood transfusion 1980's   2nd surgical procedures   Hypercholesteremia    Hypothyroidism    Myocardial infarction (HCC) 02/2012   NSTEMI (non-ST elevated myocardial infarction) (HCC) 04/02/2015   OSA (obstructive sleep apnea) 05/13/2016   Ovarian tumor    PAD (peripheral artery disease) (HCC)    a. s/p LE angio 2015; followed by Dr. Kirke Corin - managed medically.   Pericardial effusion    a. 06/2016 after ppm - s/p pericardiocentesis.   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 s/p Medtronic PPM 06/2016, c/b lead perf/pericardial effusion.   TIA (transient ischemic attack)    Type 2 diabetes with nephropathy (HCC) 02/29/2012   Past Surgical History:  Procedure Laterality Date   A-FLUTTER ABLATION N/A 12/08/2022   Procedure: A-FLUTTER ABLATION;  Surgeon: Regan Lemming, MD;  Location: [facility];  Service: Cardiovascular;  Laterality: N/A;   ABDOMINAL AORTAGRAM N/A 01/03/2014   Procedure: ABDOMINAL Ronny Flurry;  Surgeon: Iran Ouch, MD;  Location: [facility] CATH LAB;  Service: Cardiovascular;  Laterality: N/A;   ABDOMINAL HYSTERECTOMY  06/22/1970   "partial"   APPENDECTOMY  02/20/1969   ATRIAL FIBRILLATION ABLATION N/A 08/14/2022   Procedure: ATRIAL FIBRILLATION ABLATION;  Surgeon: Regan Lemming, MD;  Location: [facility];  Service: Cardiovascular;  Laterality: N/A;   ATRIAL FIBRILLATION ABLATION N/A 12/08/2022   Procedure: ATRIAL FIBRILLATION ABLATION;  Surgeon: Regan Lemming, MD;  Location: [facility];  Service: Cardiovascular;  Laterality: N/A;   CARDIAC CATHETERIZATION   06/22/2006   Tiny OM-2 with 90% narrowing. Med tx.   CARDIAC CATHETERIZATION N/A 11/30/2014   Procedure: Left Heart Cath and Coronary Angiography;  Surgeon: Lennette Bihari, MD; LAD 20%, CFX 50%, OM1 95%, right PLB 30%, LV normal    CARDIAC CATHETERIZATION N/A 11/30/2014   Procedure: Coronary Balloon Angioplasty;  Surgeon: Lennette Bihari, MD;  Angiosculpt scoring balloon and PTCA to the OM1 reducing stenosis from 95% to less than 10%   CARDIAC CATHETERIZATION N/A 04/03/2015   Procedure: Left Heart Cath and Coronary Angiography;  Surgeon: Dolores Patty, MD; dLAD 50%, CFX 90%, OM1 100%, PLA 15%, LVEDP 13     CARDIAC CATHETERIZATION N/A 04/03/2015   Procedure: Coronary Stent Intervention;  Surgeon: Tonny Bollman, MD; 3.0x18 mm Xience DES to the CFX     CARDIAC CATHETERIZATION N/A 08/02/2015   Procedure: Left Heart Cath and Coronary Angiography;  Surgeon: Lennette Bihari, MD;  Location: [facility] INVASIVE CV LAB;  Service: Cardiovascular;  Laterality: N/A;   CARDIAC CATHETERIZATION N/A 08/02/2015   Procedure: Coronary Stent Intervention;  Surgeon: Lennette Bihari, MD;  Location: [facility];  Service: Cardiovascular;  Laterality: N/A;   CARDIAC CATHETERIZATION N/A 06/25/2016  Procedure: Pericardiocentesis;  Surgeon: Will Jorja Loa, MD;  Location: [facility];  Service: Cardiovascular;  Laterality: N/A;   cardiac stents     CARDIOVERSION N/A 12/15/2017   Procedure: CARDIOVERSION;  Surgeon: Elease Hashimoto Deloris Ping, MD;  Location: [facility] ENDOSCOPY;  Service: Cardiovascular;  Laterality: N/A;   CARDIOVERSION N/A 05/12/2022   Procedure: CARDIOVERSION;  Surgeon: Quintella Reichert, MD;  Location: [facility] ENDOSCOPY;  Service: Cardiovascular;  Laterality: N/A;   CARDIOVERSION N/A 09/30/2022   Procedure: CARDIOVERSION;  Surgeon: Thurmon Fair, MD;  Location: [facility];  Service: Cardiovascular;  Laterality: N/A;   CARDIOVERSION N/A 10/29/2022   Procedure: CARDIOVERSION;  Surgeon: Quintella Reichert, MD;   Location: [facility];  Service: Cardiovascular;  Laterality: N/A;   CHOLECYSTECTOMY OPEN  02/20/1989   COLONOSCOPY  06/23/2003   Dr. Karilyn Cota: pancolonic divericula, polyp, path unknown currently   COLONOSCOPY  06/22/2010   Dr. Darrick Penna: Normal TI, scattered diverticula in entire colon, small internal hemorrhoids, normal colon biopsies. Colonoscopy in 5-10 years.    COLONOSCOPY WITH PROPOFOL N/A 06/02/2021   pancolonic diverticulosis. Two 4-6 mm polyps in transverse colon. Sessile serrated and hyperplastic. 5 year surveillance if benefits outweigh the risks.   COLOSTOMY  05/23/1979   COLOSTOMY REVERSAL  11/21/1979   EP IMPLANTABLE DEVICE N/A 06/25/2016   Procedure: Lead Revision/Repair;  Surgeon: Will Jorja Loa, MD;  Location: [facility];  Service: Cardiovascular;  Laterality: N/A;   EP IMPLANTABLE DEVICE N/A 06/25/2016   Procedure: Pacemaker Implant;  Surgeon: Will Jorja Loa, MD;  Location: [facility];  Service: Cardiovascular;  Laterality: N/A;   EXCISIONAL HEMORRHOIDECTOMY  02/20/1969   EYE SURGERY Left 06/22/1998   "branch vein occlusion"   EYE SURGERY Left ~ 2001   "smoothed out wrinkle"   LEFT HEART CATH AND CORONARY ANGIOGRAPHY N/A 05/11/2022   Procedure: LEFT HEART CATH AND CORONARY ANGIOGRAPHY;  Surgeon: Kathleene Hazel, MD;  Location: [facility];  Service: Cardiovascular;  Laterality: N/A;   LEFT OOPHORECTOMY  05/23/1979   nicked bowel, peritonitis, colostomy; colostomy reversed 1981    LOWER EXTREMITY ANGIOGRAM N/A 01/03/2014   Procedure: LOWER EXTREMITY ANGIOGRAM;  Surgeon: Iran Ouch, MD;  Location: [facility] CATH LAB;  Service: Cardiovascular;  Laterality: N/A;   Nuclear med stress test  10/21/2011   Small area of mild ischemia inferoapically.   PARTIAL HYSTERECTOMY  02/20/1969   left ovaries, then ovaries removed later due tumors    POLYPECTOMY  06/02/2021   Procedure: POLYPECTOMY;  Surgeon: Lanelle Bal, DO;  Location:  [facility];  Service: Endoscopy;;   right eye surgery  03/2021   RIGHT OOPHORECTOMY  02/20/1969   TEE WITHOUT CARDIOVERSION N/A 05/12/2022   Procedure: TRANSESOPHAGEAL ECHOCARDIOGRAM (TEE);  Surgeon: Quintella Reichert, MD;  Location: [facility] ENDOSCOPY;  Service: Cardiovascular;  Laterality: N/A;   TEE WITHOUT CARDIOVERSION N/A 08/14/2022   Procedure: TRANSESOPHAGEAL ECHOCARDIOGRAM;  Surgeon: Regan Lemming, MD;  Location: [facility] INVASIVE CV LAB;  Service: Cardiovascular;  Laterality: N/A;   TEE WITHOUT CARDIOVERSION N/A 12/08/2022   Procedure: TRANSESOPHAGEAL ECHOCARDIOGRAM;  Surgeon: Regan Lemming, MD;  Location: [facility] INVASIVE CV LAB;  Service: Cardiovascular;  Laterality: N/A;    Current Outpatient Medications  Medication Sig Dispense Refill   acetaminophen (TYLENOL) 500 MG tablet Take 1,000 mg by mouth every 8 (eight) hours as needed for headache, moderate pain or mild pain.     ALPRAZolam (XANAX) 0.25 MG tablet Take 1-2 tablets (0.25-0.5 mg  total) by mouth at bedtime as needed for sleep (neuropathy). 60 tablet 0   atorvastatin (LIPITOR) 80 MG tablet TAKE ONE TABLET BY MOUTH EVERY DAY AT 6:00PM 90 tablet 3   calcitRIOL (ROCALTROL) 0.25 MCG capsule Take 0.25 mcg by mouth every Monday, Wednesday, and Friday.     carvedilol (COREG) 25 MG tablet Take 1 tablet (25 mg total) by mouth 2 (two) times daily. 60 tablet 3   Cholecalciferol 50 MCG (2000 UT) TABS Take 4,000 Units by mouth at bedtime.     clopidogrel (PLAVIX) 75 MG tablet TAKE ONE TABLET (75MG  TOTAL) BY MOUTH DAILY 90 tablet 3   Continuous Blood Gluc Receiver (FREESTYLE LIBRE 2 READER) DEVI 1 each by Does not apply route daily. 1 each 0   Continuous Glucose Sensor (FREESTYLE LIBRE 2 SENSOR) MISC USE ONE FOR FOURTEEN DAYS 6 each 3   ELIQUIS 5 MG TABS tablet TAKE ONE TABLET BY MOUTH TWICE DAILY 60 tablet 5   furosemide (LASIX) 40 MG tablet Take 40 mg by mouth daily as needed for fluid or edema.     glucose blood (FREESTYLE PRECISION NEO  TEST) test strip Use as instructed 100 each 3   Insulin NPH, Human,, Isophane, (NOVOLIN N FLEXPEN) 100 UNIT/ML Kiwkpen Inject 15 units subcutaneously in the morning & inject 4 units subcutaneously at night.     levothyroxine (SYNTHROID) 112 MCG tablet TAKE ONE TABLET BY MOUTH ONCE DAILY 90 tablet 2   nitroGLYCERIN (NITROSTAT) 0.4 MG SL tablet Place 1 tablet (0.4 mg total) under the tongue every 5 (five) minutes x 3 doses as needed for chest pain (If not relief after 3rd dose, call 911 or go to ED). 25 tablet 0   olmesartan (BENICAR) 5 MG tablet Take 1 tablet (5 mg total) by mouth daily. 30 tablet 4   rOPINIRole (REQUIP) 0.5 MG tablet Take 1 tablet (0.5 mg total) by mouth at bedtime. 30 tablet 5   Semaglutide, 1 MG/DOSE, (OZEMPIC, 1 MG/DOSE,) 4 MG/3ML SOPN Inject 1 mg into the skin once a week. 3 mL 2   No current facility-administered medications for this encounter.    Allergies  Allergen Reactions   Penicillins Hives    Has patient had a PCN reaction causing immediate rash, facial/tongue/throat swelling, SOB or lightheadedness with hypotension: Yes Has patient had a PCN reaction causing severe rash involving mucus membranes or skin necrosis: No Has patient had a PCN reaction that required hospitalization No Has patient had a PCN reaction occurring within the last 10 years: No If all of the above answers are "NO", then may proceed with Cephalosporin use.   Percocet [Oxycodone-Acetaminophen] Nausea And Vomiting    ROS- All systems are reviewed and negative except as per the HPI above.  Physical Exam: Vitals:   07/27/23 1104  BP: (!) 148/98  Pulse: 75  Weight: 59.2 kg  Height: 5\' 3"  (1.6 m)    GEN- The patient is well appearing, alert and oriented x 3 today.   Neck - no JVD or carotid bruit noted Lungs- Clear to ausculation bilaterally, normal work of breathing Heart- Irregular rate and rhythm, no murmurs, rubs or gallops, PMI not laterally displaced Extremities- no clubbing,  cyanosis, or edema Skin - no rash or ecchymosis noted   Wt Readings from Last 3 Encounters:  07/27/23 59.2 kg  05/17/23 58.1 kg  04/13/23 58.1 kg    EKG today demonstrates  Vent. rate 75 BPM PR interval * ms QRS duration 134 ms QT/QTcB 460/513 ms P-R-T  axes * -41 149 Atrial flutter with variable A-V block Left axis deviation Left ventricular hypertrophy with QRS widening and repolarization abnormality ( R in aVL , Cornell product , Romhilt-Estes ) Abnormal ECG When compared with ECG of 13-Apr-2023 16:08, PREVIOUS ECG IS PRESENT  ECHO 04/14/23: 1. Left ventricular ejection fraction, by estimation, is 45 to 50%. The  left ventricle has mildly decreased function. The left ventricle  demonstrates global hypokinesis. The left ventricular internal cavity size  was mildly dilated. There is mild  concentric left ventricular hypertrophy. Left ventricular diastolic  parameters are consistent with Grade II diastolic dysfunction  (pseudonormalization).   2. Right ventricular systolic function is normal. The right ventricular  size is normal. Tricuspid regurgitation signal is inadequate for assessing  PA pressure.   3. Left atrial size was moderately dilated.   4. The mitral valve is degenerative. Trivial mitral valve regurgitation.  Moderate mitral annular calcification.   5. The aortic valve is tricuspid. There is mild calcification of the  aortic valve. Aortic valve regurgitation is not visualized. Aortic valve  sclerosis/calcification is present, without any evidence of aortic  stenosis.   6. The inferior vena cava is normal in size with greater than 50%  respiratory variability, suggesting right atrial pressure of 3 mmHg.   Comparison(s): Prior images reviewed side by side. LVEF has improved, now  in the range of 45-50%.   Epic records are reviewed at length today  CHA2DS2-VASc Score = 6  The patient's score is based upon: CHF History: 1 HTN History: 1 Diabetes History:  0 Stroke History: 0 Vascular Disease History: 1 Age Score: 2 Gender Score: 1       ASSESSMENT AND PLAN: 1. Persistent Atrial Fibrillation/atrial flutter The patient's CHA2DS2-VASc score is 6, indicating a 9.7% annual risk of stroke.   S/p afib and flutter ablation 08/14/22 S/p DCCV 09/30/22 with quick return of atrial flutter. S/p Afib ablation on 12/08/22. Amiodarone  stopped due to tremors.   She is in atrial flutter. We discussed treatment options to try and convert patient to NSR. After discussion, she wishes to proceed with cardioversion to try to convert to normal rhythm. Labs obtained today. She will hold Ozempic .   Informed Consent   Shared Decision Making/Informed Consent The risks (stroke, cardiac arrhythmias rarely resulting in the need for a temporary or permanent pacemaker, skin irritation or burns and complications associated with conscious sedation including aspiration, arrhythmia, respiratory failure and death), benefits (restoration of normal sinus rhythm) and alternatives of a direct current cardioversion were explained in detail to Ms. Gadbois and she agrees to proceed.      2. Secondary Hypercoagulable State (ICD10:  D68.69) The patient is at significant risk for stroke/thromboembolism based upon her CHA2DS2-VASc Score of 6.  Continue Apixaban  (Eliquis ).  Continue without interruption.   3. Chronic systolic CHF EF 45-50%. She appears euvolemic today.   4. Obstructive sleep apnea She uses her CPAP regularly.   5. SSS S/p PPM, followed by Dr Inocencio and the device clinic.  6. CAD No chest pain.    Follow up as scheduled with EP.   Fairy Heinrich, PA-C [facility] [address] [place], [place] [address] [phone] 07/27/2023 11:25 AM

## 2023-07-27 NOTE — Progress Notes (Signed)
 Primary Care Physician: Marvine Rush, MD Primary Cardiologist: Dr Debera  Primary Electrophysiologist: Dr Inocencio  Referring Physician: Dr Inocencio [name] is a 78 y.o. female with a history of CAD, PAD, HFrEF, HTN, SSS s/p PPM, OSA, atrial flutter, atrial fibrillation who presents for follow up in the [facility]. She was hospitalized December 2017 with atrial fibrillation rapid rates. She was cardioverted to junctional rhythm. She is now status post Medtronic dual-chamber pacemaker. She has been maintained on amiodarone  but has continued to have episodes of rapid afib requiring hospitalization. She is now s/p afib and flutter ablation with Dr Inocencio. Patient is on Eliquis  for a CHADS2VASC score of 6. She did have some oozing from her groin sites and was kept two additional nights post ablation.   On follow up today, patient is s/p DCCV on 09/30/22. Unfortunately, her PPM alerted that she was back out of rhythm 3 days later. She is fatigued, especially with exertion. No bleeding issues on anticoagulation. Of note, her daughter was just diagnosed with lung cancer.   On follow up 01/05/23, she is currently in NSR. Patient is s/p Afib ablation on 12/08/22 by Dr. Inocencio. No episodes of Afib since ablation. Intermittent dizziness since last week; no syncope. Admits to maybe not being consistent with hydration. No chest pain, SOB, or trouble swallowing. Leg sites healed without issue. No missed doses of anticoagulant.  On follow up 07/27/23, she is currently in atrial flutter. Device interrogation on 1/15 showed persistent Afib. She missed a dose of Eliquis  on Sunday night. She feels like she has no energy when out of rhythm.  Today, she denies symptoms of orthopnea, PND, lower extremity edema, dizziness, presyncope, syncope, snoring, daytime somnolence, bleeding, or neurologic sequela. The patient is tolerating medications without difficulties and is otherwise  without complaint today.   Atrial Fibrillation Risk Factors:  she does have symptoms or diagnosis of sleep apnea. she is compliant with CPAP therapy. she does not have a history of rheumatic fever.   she has a BMI of Body mass index is 23.13 kg/m.SABRA Filed Weights   07/27/23 1104  Weight: 59.2 kg     Family History  Problem Relation Age of Onset   Heart disease Mother        deceased   Heart disease Father        deceased, heart disease   Diabetes Brother    Heart disease Brother    Thyroid  disease Brother    Heart disease Sister    Heart disease Brother    Thyroid  disease Brother    Lupus Daughter    Colon cancer Neg Hx     Atrial Fibrillation Management history:  Previous antiarrhythmic drugs: amiodarone   Previous cardioversions: 2019, 05/12/22, 09/30/22 Previous ablations: 08/14/22, 12/08/22 CHADS2VASC score: 6 Anticoagulation history: Eliquis    Past Medical History:  Diagnosis Date   (HFpEF) heart failure with preserved ejection fraction (HCC)    Advanced nonexudative age-related macular degeneration of left eye with subfoveal involvement 06/26/2020   Ongoing, accounts for acuity   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 h/o PAF/flutter during admission in 2013 for PNA. b. PAF during adm for NSTEMI 07/2015, subsequent paroxysms since then.   B12 deficiency anemia    Branch retinal vein occlusion with macular edema of right eye 10/11/2019   Chronic renal failure, stage 3b (HCC) 09/23/2017   Coronary artery  disease 11/30/2014   a. remote MI. b. h/o PTCA with scoring balloon to OM1 11/2014. c. NSTEMI 03/2015 s/p DES to prox-mid Cx. d. NSTEMI 07/2015 s/p scoring balloon/PTCA/DES to dRCA with PAF during that admission   Cutaneous lupus erythematosus    Early stage nonexudative age-related macular degeneration of right eye 02/27/2020   Essential hypertension    GERD (gastroesophageal reflux disease)     History of blood transfusion 1980's   2nd surgical procedures   Hypercholesteremia    Hypothyroidism    Myocardial infarction (HCC) 02/2012   NSTEMI (non-ST elevated myocardial infarction) (HCC) 04/02/2015   OSA (obstructive sleep apnea) 05/13/2016   Ovarian tumor    PAD (peripheral artery disease) (HCC)    a. s/p LE angio 2015; followed by Dr. Darron - managed medically.   Pericardial effusion    a. 06/2016 after ppm - s/p pericardiocentesis.   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 s/p Medtronic PPM 06/2016, c/b lead perf/pericardial effusion.   TIA (transient ischemic attack)    Type 2 diabetes with nephropathy (HCC) 02/29/2012   Past Surgical History:  Procedure Laterality Date   A-FLUTTER ABLATION N/A 12/08/2022   Procedure: A-FLUTTER ABLATION;  Surgeon: Inocencio Soyla Lunger, MD;  Location: [facility];  Service: Cardiovascular;  Laterality: N/A;   ABDOMINAL AORTAGRAM N/A 01/03/2014   Procedure: ABDOMINAL EZELLA;  Surgeon: Deatrice DELENA Darron, MD;  Location: [facility] CATH LAB;  Service: Cardiovascular;  Laterality: N/A;   ABDOMINAL HYSTERECTOMY  06/22/1970   partial   APPENDECTOMY  02/20/1969   ATRIAL FIBRILLATION ABLATION N/A 08/14/2022   Procedure: ATRIAL FIBRILLATION ABLATION;  Surgeon: Inocencio Soyla Lunger, MD;  Location: [facility];  Service: Cardiovascular;  Laterality: N/A;   ATRIAL FIBRILLATION ABLATION N/A 12/08/2022   Procedure: ATRIAL FIBRILLATION ABLATION;  Surgeon: Inocencio Soyla Lunger, MD;  Location: [facility];  Service: Cardiovascular;  Laterality: N/A;   CARDIAC CATHETERIZATION   06/22/2006   Tiny OM-2 with 90% narrowing. Med tx.   CARDIAC CATHETERIZATION N/A 11/30/2014   Procedure: Left Heart Cath and Coronary Angiography;  Surgeon: Debby DELENA Sor, MD; LAD 20%, CFX 50%, OM1 95%, right PLB 30%, LV normal    CARDIAC CATHETERIZATION N/A 11/30/2014   Procedure: Coronary Balloon Angioplasty;  Surgeon: Debby DELENA Sor, MD;  Angiosculpt scoring balloon and PTCA to the OM1 reducing stenosis from 95% to less than 10%   CARDIAC CATHETERIZATION N/A 04/03/2015   Procedure: Left Heart Cath and Coronary Angiography;  Surgeon: Toribio JONELLE Fuel, MD; dLAD 50%, CFX 90%, OM1 100%, PLA 15%, LVEDP 13     CARDIAC CATHETERIZATION N/A 04/03/2015   Procedure: Coronary Stent Intervention;  Surgeon: Ozell Fell, MD; 3.0x18 mm Xience DES to the CFX     CARDIAC CATHETERIZATION N/A 08/02/2015   Procedure: Left Heart Cath and Coronary Angiography;  Surgeon: Debby DELENA Sor, MD;  Location: [facility] INVASIVE CV LAB;  Service: Cardiovascular;  Laterality: N/A;   CARDIAC CATHETERIZATION N/A 08/02/2015   Procedure: Coronary Stent Intervention;  Surgeon: Debby DELENA Sor, MD;  Location: [facility];  Service: Cardiovascular;  Laterality: N/A;   CARDIAC CATHETERIZATION N/A 06/25/2016  Procedure: Pericardiocentesis;  Surgeon: Will Gladis Norton, MD;  Location: [facility];  Service: Cardiovascular;  Laterality: N/A;   cardiac stents     CARDIOVERSION N/A 12/15/2017   Procedure: CARDIOVERSION;  Surgeon: Alveta Aleene PARAS, MD;  Location: [facility] ENDOSCOPY;  Service: Cardiovascular;  Laterality: N/A;   CARDIOVERSION N/A 05/12/2022   Procedure: CARDIOVERSION;  Surgeon: Shlomo Wilbert SAUNDERS, MD;  Location: [facility] ENDOSCOPY;  Service: Cardiovascular;  Laterality: N/A;   CARDIOVERSION N/A 09/30/2022   Procedure: CARDIOVERSION;  Surgeon: Francyne Headland, MD;  Location: [facility];  Service: Cardiovascular;  Laterality: N/A;   CARDIOVERSION N/A 10/29/2022   Procedure: CARDIOVERSION;  Surgeon: Shlomo Wilbert SAUNDERS, MD;   Location: [facility];  Service: Cardiovascular;  Laterality: N/A;   CHOLECYSTECTOMY OPEN  02/20/1989   COLONOSCOPY  06/23/2003   Dr. Golda: pancolonic divericula, polyp, path unknown currently   COLONOSCOPY  06/22/2010   Dr. Harvey: Normal TI, scattered diverticula in entire colon, small internal hemorrhoids, normal colon biopsies. Colonoscopy in 5-10 years.    COLONOSCOPY WITH PROPOFOL  N/A 06/02/2021   pancolonic diverticulosis. Two 4-6 mm polyps in transverse colon. Sessile serrated and hyperplastic. 5 year surveillance if benefits outweigh the risks.   COLOSTOMY  05/23/1979   COLOSTOMY REVERSAL  11/21/1979   EP IMPLANTABLE DEVICE N/A 06/25/2016   Procedure: Lead Revision/Repair;  Surgeon: Will Gladis Norton, MD;  Location: [facility];  Service: Cardiovascular;  Laterality: N/A;   EP IMPLANTABLE DEVICE N/A 06/25/2016   Procedure: Pacemaker Implant;  Surgeon: Will Gladis Norton, MD;  Location: [facility];  Service: Cardiovascular;  Laterality: N/A;   EXCISIONAL HEMORRHOIDECTOMY  02/20/1969   EYE SURGERY Left 06/22/1998   branch vein occlusion   EYE SURGERY Left ~ 2001   smoothed out wrinkle   LEFT HEART CATH AND CORONARY ANGIOGRAPHY N/A 05/11/2022   Procedure: LEFT HEART CATH AND CORONARY ANGIOGRAPHY;  Surgeon: Verlin Lonni BIRCH, MD;  Location: [facility];  Service: Cardiovascular;  Laterality: N/A;   LEFT OOPHORECTOMY  05/23/1979   nicked bowel, peritonitis, colostomy; colostomy reversed 1981    LOWER EXTREMITY ANGIOGRAM N/A 01/03/2014   Procedure: LOWER EXTREMITY ANGIOGRAM;  Surgeon: Deatrice DELENA Cage, MD;  Location: [facility] CATH [facility];  Service: Cardiovascular;  Laterality: N/A;   Nuclear med stress test  10/21/2011   Small area of mild ischemia inferoapically.   PARTIAL HYSTERECTOMY  02/20/1969   left ovaries, then ovaries removed later due tumors    POLYPECTOMY  06/02/2021   Procedure: POLYPECTOMY;  Surgeon: Cindie Carlin POUR, DO;  [facility];  Service: Endoscopy;;   right eye surgery  03/2021   RIGHT OOPHORECTOMY  02/20/1969   TEE WITHOUT CARDIOVERSION N/A 05/12/2022   Procedure: TRANSESOPHAGEAL ECHOCARDIOGRAM (TEE);  Surgeon: Shlomo Wilbert SAUNDERS, MD;  Location: [facility] ENDOSCOPY;  Service: Cardiovascular;  Laterality: N/A;   TEE WITHOUT CARDIOVERSION N/A 08/14/2022   Procedure: TRANSESOPHAGEAL ECHOCARDIOGRAM;  Surgeon: Norton Soyla Gladis, MD;  Location: [facility] INVASIVE CV LAB;  Service: Cardiovascular;  Laterality: N/A;   TEE WITHOUT CARDIOVERSION N/A 12/08/2022   Procedure: TRANSESOPHAGEAL ECHOCARDIOGRAM;  Surgeon: Norton Soyla Gladis, MD;  Location: [facility] INVASIVE CV LAB;  Service: Cardiovascular;  Laterality: N/A;    Current Outpatient Medications  Medication Sig Dispense Refill   acetaminophen  (TYLENOL ) 500 MG tablet Take 1,000 mg by mouth every 8 (eight) hours as needed for headache, moderate pain or mild pain.     ALPRAZolam  (XANAX ) 0.25 MG tablet Take 1-2 tablets (0.25-0.5 mg  total) by mouth at bedtime as needed for sleep (neuropathy). 60 tablet 0   atorvastatin  (LIPITOR ) 80 MG tablet TAKE ONE TABLET BY MOUTH EVERY DAY AT 6:00PM 90 tablet 3   calcitRIOL  (ROCALTROL ) 0.25 MCG capsule Take 0.25 mcg by mouth every Monday, Wednesday, and Friday.     carvedilol  (COREG ) 25 MG tablet Take 1 tablet (25 mg total) by mouth 2 (two) times daily. 60 tablet 3   Cholecalciferol  50 MCG (2000 UT) TABS Take 4,000 Units by mouth at bedtime.     clopidogrel  (PLAVIX ) 75 MG tablet TAKE ONE TABLET (75MG  TOTAL) BY MOUTH DAILY 90 tablet 3   Continuous Blood Gluc Receiver (FREESTYLE LIBRE 2 READER) DEVI 1 each by Does not apply route daily. 1 each 0   Continuous Glucose Sensor (FREESTYLE LIBRE 2 SENSOR) MISC USE ONE FOR FOURTEEN DAYS 6 each 3   ELIQUIS  5 MG TABS tablet TAKE ONE TABLET BY MOUTH TWICE DAILY 60 tablet 5   furosemide  (LASIX ) 40 MG tablet Take 40 mg by mouth daily as needed for fluid or edema.     glucose blood (FREESTYLE PRECISION NEO  TEST) test strip Use as instructed 100 each 3   Insulin  NPH, Human,, Isophane, (NOVOLIN  N FLEXPEN) 100 UNIT/ML Kiwkpen Inject 15 units subcutaneously in the morning & inject 4 units subcutaneously at night.     levothyroxine  (SYNTHROID ) 112 MCG tablet TAKE ONE TABLET BY MOUTH ONCE DAILY 90 tablet 2   nitroGLYCERIN  (NITROSTAT ) 0.4 MG SL tablet Place 1 tablet (0.4 mg total) under the tongue every 5 (five) minutes x 3 doses as needed for chest pain (If not relief after 3rd dose, call 911 or go to ED). 25 tablet 0   olmesartan  (BENICAR ) 5 MG tablet Take 1 tablet (5 mg total) by mouth daily. 30 tablet 4   rOPINIRole  (REQUIP ) 0.5 MG tablet Take 1 tablet (0.5 mg total) by mouth at bedtime. 30 tablet 5   Semaglutide , 1 MG/DOSE, (OZEMPIC , 1 MG/DOSE,) 4 MG/3ML SOPN Inject 1 mg into the skin once a week. 3 mL 2   No current facility-administered medications for this encounter.    Allergies  Allergen Reactions   Penicillins Hives    Has patient had a PCN reaction causing immediate rash, facial/tongue/throat swelling, SOB or lightheadedness with hypotension: Yes Has patient had a PCN reaction causing severe rash involving mucus membranes or skin necrosis: No Has patient had a PCN reaction that required hospitalization No Has patient had a PCN reaction occurring within the last 10 years: No If all of the above answers are NO, then may proceed with Cephalosporin use.   Percocet Brianni.[email or web address] ] Nausea And Vomiting    ROS- All systems are reviewed and negative except as per the HPI above.  Physical Exam: Vitals:   07/27/23 1104  BP: (!) 148/98  Pulse: 75  Weight: 59.2 kg  Height: 5' 3 (1.6 m)    GEN- The patient is well appearing, alert and oriented x 3 today.   Neck - no JVD or carotid bruit noted Lungs- Clear to ausculation bilaterally, normal work of breathing Heart- Irregular rate and rhythm, no murmurs, rubs or gallops, PMI not laterally displaced Extremities- no clubbing,  cyanosis, or edema Skin - no rash or ecchymosis noted   Wt Readings from Last 3 Encounters:  07/27/23 59.2 kg  05/17/23 58.1 kg  04/13/23 58.1 kg    EKG today demonstrates  Vent. rate 75 BPM PR interval * ms QRS duration 134 ms QT/QTcB 460/513 ms P-R-T  axes * -41 149 Atrial flutter with variable A-V block Left axis deviation Left ventricular hypertrophy with QRS widening and repolarization abnormality ( R in aVL , Cornell product , Romhilt-Estes ) Abnormal ECG When compared with ECG of 13-Apr-2023 16:08, PREVIOUS ECG IS PRESENT  ECHO 04/14/23: 1. Left ventricular ejection fraction, by estimation, is 45 to 50%. The  left ventricle has mildly decreased function. The left ventricle  demonstrates global hypokinesis. The left ventricular internal cavity size  was mildly dilated. There is mild  concentric left ventricular hypertrophy. Left ventricular diastolic  parameters are consistent with Grade II diastolic dysfunction  (pseudonormalization).   2. Right ventricular systolic function is normal. The right ventricular  size is normal. Tricuspid regurgitation signal is inadequate for assessing  PA pressure.   3. Left atrial size was moderately dilated.   4. The mitral valve is degenerative. Trivial mitral valve regurgitation.  Moderate mitral annular calcification.   5. The aortic valve is tricuspid. There is mild calcification of the  aortic valve. Aortic valve regurgitation is not visualized. Aortic valve  sclerosis/calcification is present, without any evidence of aortic  stenosis.   6. The inferior vena cava is normal in size with greater than 50%  respiratory variability, suggesting right atrial pressure of 3 mmHg.   Comparison(s): Prior images reviewed side by side. LVEF has improved, now  in the range of 45-50%.   Epic records are reviewed at length today  CHA2DS2-VASc Score = 6  The patient's score is based upon: CHF History: 1 HTN History: 1 Diabetes History:  0 Stroke History: 0 Vascular Disease History: 1 Age Score: 2 Gender Score: 1       ASSESSMENT AND PLAN: 1. Persistent Atrial Fibrillation/atrial flutter The patient's CHA2DS2-VASc score is 6, indicating a 9.7% annual risk of stroke.   S/p afib and flutter ablation 08/14/22 S/p DCCV 09/30/22 with quick return of atrial flutter. S/p Afib ablation on 12/08/22. Amiodarone  stopped due to tremors.   She is in atrial flutter. We discussed treatment options to try and convert patient to NSR. After discussion, she wishes to proceed with cardioversion to try to convert to normal rhythm. Labs obtained today. She will hold Ozempic .   Informed Consent   Shared Decision Making/Informed Consent The risks (stroke, cardiac arrhythmias rarely resulting in the need for a temporary or permanent pacemaker, skin irritation or burns and complications associated with conscious sedation including aspiration, arrhythmia, respiratory failure and death), benefits (restoration of normal sinus rhythm) and alternatives of a direct current cardioversion were explained in detail to Ms. Gadbois and she agrees to proceed.      2. Secondary Hypercoagulable State (ICD10:  D68.69) The patient is at significant risk for stroke/thromboembolism based upon her CHA2DS2-VASc Score of 6.  Continue Apixaban  (Eliquis ).  Continue without interruption.   3. Chronic systolic CHF EF 45-50%. She appears euvolemic today.   4. Obstructive sleep apnea She uses her CPAP regularly.   5. SSS S/p PPM, followed by Dr Inocencio and the device clinic.  6. CAD No chest pain.    Follow up as scheduled with EP.   Fairy Heinrich, PA-C [facility] [address] [place], [place] [address] [phone] 07/27/2023 11:25 AM

## 2023-07-28 DIAGNOSIS — N200 Calculus of kidney: Secondary | ICD-10-CM | POA: Diagnosis not present

## 2023-07-28 DIAGNOSIS — E1129 Type 2 diabetes mellitus with other diabetic kidney complication: Secondary | ICD-10-CM | POA: Diagnosis not present

## 2023-07-28 DIAGNOSIS — N2581 Secondary hyperparathyroidism of renal origin: Secondary | ICD-10-CM | POA: Diagnosis not present

## 2023-07-28 DIAGNOSIS — E1122 Type 2 diabetes mellitus with diabetic chronic kidney disease: Secondary | ICD-10-CM | POA: Diagnosis not present

## 2023-07-31 DIAGNOSIS — G4733 Obstructive sleep apnea (adult) (pediatric): Secondary | ICD-10-CM | POA: Diagnosis not present

## 2023-08-02 ENCOUNTER — Encounter: Payer: Self-pay | Admitting: Nurse Practitioner

## 2023-08-02 ENCOUNTER — Ambulatory Visit: Payer: PPO | Attending: Cardiology

## 2023-08-02 NOTE — Progress Notes (Signed)
 Patient here per peck to have BP checked  Patient states she see Dr.Bhutani last week and he went up on Olmesartan  to 10 mg daily and this is gradually bringing her BP down  Patient seen afib clinic last week and they want her to do another cardioversion patient states this will be the 3rd time and she feels she may not need it done since it is not sticking and patient is asymptomatic to her afib  Patient BP today is 124/70 HR- 62  Patient provided BP log will scan into peck  Sent to peck for review

## 2023-08-04 ENCOUNTER — Ambulatory Visit (INDEPENDENT_AMBULATORY_CARE_PROVIDER_SITE_OTHER): Payer: PPO | Admitting: Internal Medicine

## 2023-08-04 ENCOUNTER — Other Ambulatory Visit: Payer: Self-pay | Admitting: Neurology

## 2023-08-04 ENCOUNTER — Encounter: Payer: Self-pay | Admitting: Internal Medicine

## 2023-08-04 VITALS — BP 126/70 | HR 83 | Ht 63.0 in | Wt 129.0 lb

## 2023-08-04 DIAGNOSIS — E785 Hyperlipidemia, unspecified: Secondary | ICD-10-CM

## 2023-08-04 DIAGNOSIS — Z794 Long term (current) use of insulin: Secondary | ICD-10-CM

## 2023-08-04 DIAGNOSIS — E1159 Type 2 diabetes mellitus with other circulatory complications: Secondary | ICD-10-CM | POA: Diagnosis not present

## 2023-08-04 DIAGNOSIS — E1165 Type 2 diabetes mellitus with hyperglycemia: Secondary | ICD-10-CM | POA: Diagnosis not present

## 2023-08-04 DIAGNOSIS — E039 Hypothyroidism, unspecified: Secondary | ICD-10-CM | POA: Diagnosis not present

## 2023-08-04 DIAGNOSIS — Z7985 Long-term (current) use of injectable non-insulin antidiabetic drugs: Secondary | ICD-10-CM | POA: Diagnosis not present

## 2023-08-04 LAB — POCT GLYCOSYLATED HEMOGLOBIN (HGB A1C): Hemoglobin A1C: 7.5 % — AB (ref 4.0–5.6)

## 2023-08-04 MED ORDER — LYUMJEV KWIKPEN 100 UNIT/ML ~~LOC~~ SOPN: PEN_INJECTOR | SUBCUTANEOUS | 3 refills | Status: DC

## 2023-08-04 NOTE — Patient Instructions (Addendum)
Please change from NPH to: - Lantus 20 units in am  Start: - Lyumjev 4-6 units before brunch and dinner  When cleared by cardiology, restart: - Ozempic 1 mg weekly   After you restart Ozempic, we may be able to stop Lyumjev.  Continue Levothyroxine 112 mcg daily.  Take the thyroid hormone every day, with water, at least 30 minutes before breakfast, separated by at least 4 hours from: - acid reflux medications - calcium - iron - multivitamins  Please return in 3 months.

## 2023-08-04 NOTE — Telephone Encounter (Signed)
Last seen on 03/23/23 No follow up scheduled   Was patient to continue medication? I didn't see documentation to continue.   Rx pending to response.

## 2023-08-04 NOTE — Progress Notes (Signed)
Patient ID: TYLEE YUM, female   DOB: 12-30-1945, 78 y.o.   MRN: [id number]   HPI: RUDELL MARLOWE is a 78 y.o.-year-old female, initially referred by her PCP, Dr.Golding, returning for follow-up for DM2, dx in ~2005, insulin-dependent since 2006, uncontrolled, with complications (CAD, s/p NSTEMI, s/p PTCA and DES; CHF; PAF/flutter; PAD; CKD; cerebrovascular disease - s/p TIA; PN).  She previously saw my colleague, Dr. Everardo All.  Our last visit was 6 months ago.  Interim history: She continues to have blurry vision and getting intraocular injections. No increased urination, nausea, chest pain. She has dizziness and disequilibrium. She will need another cardioversion on 08/16/2023. She is off Ozempic x 2 weeks and will restart after the cardioversion.  DM2: Reviewed HbA1c levels: Lab Results  Component Value Date   HGBA1C 6.4 (A) 02/01/2023   HGBA1C 5.8 (A) 07/15/2022   HGBA1C 6.4 (H) 05/10/2022   HGBA1C 6.0 (A) 02/09/2022   HGBA1C 6.7 (A) 10/09/2021   HGBA1C 7.5 (A) 06/06/2021   HGBA1C 7.7 (A) 02/04/2021   HGBA1C 7.4 (A) 10/22/2020   HGBA1C 6.7 (A) 07/23/2020   HGBA1C 7.3 (A) 04/15/2020   Pt is on a regimen of: - NPH/R 70/30 20-22 >> 28 units in am and 28-30 before dinner >> ...>> NPH 15 units in am and 8-10  before dinner >> 5 units at bedtime - Trulicity 3 mg weekly >> Ozempic 2 mg weekly -started 05/2021 >> 1 mg weekly (off x 2 weeks). No FH of MTC or personal h/o pancreatitis. She was on Metformin >> stopped b/c CKD.  Pt checks her sugars 4x a day with the Libre CGM:  Previously:  Previously:  Lowest sugar was 55 >> 60s >> 60s >> 60; she has hypoglycemia awareness in the 70s.  Highest sugar was 190s >> 180s >> 200s >> 319.  Glucometer: Freestyle lite  Patient starting to improve her meals after her last HbA1c returned: - Brunch: scrambled egg + bacon + rye bread or cheerios + skim milk - Dinner: meat - grilled + veggies - Snacks: seldom - apple Drinks occasional  diet drinks  -+ CKD-sees nephrology: Dr. Wolfgang [place], last BUN/creatinine:  Lab Results  Component Value Date   BUN 22 07/27/2023   BUN 15 07/08/2023   CREATININE 1.55 (H) 07/27/2023   CREATININE 1.21 (H) 07/08/2023  On Benicar 40.  -+ HL; last set of lipids: 07/2023 - pending Lab Results  Component Value Date   CHOL 96 05/10/2022   HDL 30 (L) 05/10/2022   LDLCALC 35 05/10/2022   TRIG 156 (H) 05/10/2022   CHOLHDL 3.2 05/10/2022  On Lipitor 80.  - last eye exam was on 12/15/2022: Severe NPDR OD, macular edema. Dr Luciana Axe.  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  She has a history of BRAO.  She had eye surgery 03/2021 >> vision is better.  - she has numbness and tingling in her feet.  Latest foot exam was performed by podiatry Dr. Jamse Arn 05/27/2023. She has onychomycosis.  Pt has FH of DM in M and her family.  Hypothyroidism:  Pt is on levothyroxine  112 mcg daily (dose decreased to 08/2021), taken: - in am - fasting - at least 30 min from b'fast - no calcium - no iron - no multivitamins - no PPIs - not on Biotin  TSH levels were reviewed: Lab Results  Component Value Date   TSH 1.170 03/23/2023   TSH 2.100 09/25/2022   TSH  0.571 03/17/2022   TSH 1.18 10/09/2021   TSH 0.183 (L) 08/25/2021   TSH 0.40 02/04/2021   TSH 0.22 (L) 10/22/2020   TSH 0.233 (L) 08/22/2020   TSH 2.410 02/22/2020   TSH 0.424 (L) 08/15/2019   She also has a history of heart block, tachybradycardia syndrome, pericardial effusion (history of tamponade). She has Afib  - was hospitalized in 04/2022 for cardioversion.  She had a cardiac ablation in 07/2022, then cardioversion in 09/2022, and then again an ablation in 11/2022.  ROS: + see HPI  I reviewed pt's medications, allergies, PMH, social hx, family hx, and changes were documented in the history of present illness. Otherwise, unchanged from my initial visit note.  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 h/o PAF/flutter during admission in 2013 for PNA. b. PAF during adm for NSTEMI 07/2015, subsequent paroxysms since then.   B12 deficiency anemia    Branch retinal vein occlusion with macular edema of right eye 10/11/2019   Chronic renal failure, stage 3b (HCC) 09/23/2017   Coronary artery disease 11/30/2014   a. remote MI. b. h/o PTCA with scoring balloon to OM1 11/2014. c. NSTEMI 03/2015 s/p DES to prox-mid Cx. d. NSTEMI 07/2015 s/p scoring balloon/PTCA/DES to dRCA with PAF during that admission   Cutaneous lupus erythematosus    Early stage nonexudative age-related macular degeneration of right eye 02/27/2020   Essential hypertension    GERD (gastroesophageal reflux disease)    History of blood transfusion 1980's   2nd surgical procedures   Hypercholesteremia    Hypothyroidism    Myocardial infarction (HCC) 02/2012   NSTEMI (non-ST elevated myocardial infarction) (HCC) 04/02/2015   OSA (obstructive sleep apnea) 05/13/2016   Ovarian tumor    PAD (peripheral artery disease) (HCC)    a. s/p LE angio 2015; followed by Dr. Kirke Corin - managed medically.   Pericardial effusion    a. 06/2016 after ppm - s/p pericardiocentesis.   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 s/p Medtronic PPM 06/2016, c/b lead perf/pericardial  effusion.   TIA (transient ischemic attack)    Type 2 diabetes with nephropathy (HCC) 02/29/2012   Past Surgical History:  Procedure Laterality Date   A-FLUTTER ABLATION N/A 12/08/2022   Procedure: A-FLUTTER ABLATION;  Surgeon: Regan Lemming, MD;  Location: [facility] INVASIVE CV LAB;  Service: Cardiovascular;  Laterality: N/A;   ABDOMINAL AORTAGRAM N/A 01/03/2014   Procedure: ABDOMINAL Ronny Flurry;  Surgeon: Iran Ouch, MD;  Location: [facility] CATH LAB;  Service: Cardiovascular;  Laterality: N/A;   ABDOMINAL HYSTERECTOMY  06/22/1970   "partial"   APPENDECTOMY  02/20/1969   ATRIAL FIBRILLATION ABLATION N/A 08/14/2022   Procedure: ATRIAL FIBRILLATION ABLATION;  Surgeon: Regan Lemming, MD;  Location: [facility];  Service: Cardiovascular;  Laterality: N/A;   ATRIAL FIBRILLATION ABLATION N/A 12/08/2022   Procedure: ATRIAL  FIBRILLATION ABLATION;  Surgeon: Regan Lemming, MD;  Location: [facility] INVASIVE CV LAB;  Service: Cardiovascular;  Laterality: N/A;   CARDIAC CATHETERIZATION  06/22/2006   Tiny OM-2 with 90% narrowing. Med tx.   CARDIAC CATHETERIZATION N/A 11/30/2014   Procedure: Left Heart Cath and Coronary Angiography;  Surgeon: Lennette Bihari, MD; LAD 20%, CFX 50%, OM1 95%, right PLB 30%, LV normal    CARDIAC CATHETERIZATION N/A 11/30/2014   Procedure: Coronary Balloon Angioplasty;  Surgeon: Lennette Bihari, MD;  Angiosculpt scoring balloon and PTCA to the OM1 reducing stenosis from 95% to less than 10%   CARDIAC CATHETERIZATION N/A 04/03/2015   Procedure: Left Heart Cath and Coronary Angiography;  Surgeon: Dolores Patty, MD; dLAD 50%, CFX 90%, OM1 100%, PLA 15%, LVEDP 13     CARDIAC CATHETERIZATION N/A 04/03/2015   Procedure: Coronary Stent Intervention;  Surgeon: Tonny Bollman, MD; 3.0x18 mm Xience DES to the CFX     CARDIAC CATHETERIZATION N/A 08/02/2015   Procedure: Left Heart Cath and Coronary Angiography;  Surgeon: Lennette Bihari, MD;  Location: [facility] INVASIVE CV LAB;   Service: Cardiovascular;  Laterality: N/A;   CARDIAC CATHETERIZATION N/A 08/02/2015   Procedure: Coronary Stent Intervention;  Surgeon: Lennette Bihari, MD;  Location: [facility];  Service: Cardiovascular;  Laterality: N/A;   CARDIAC CATHETERIZATION N/A 06/25/2016   Procedure: Pericardiocentesis;  Surgeon: Will Jorja Loa, MD;  Location: [facility];  Service: Cardiovascular;  Laterality: N/A;   cardiac stents     CARDIOVERSION N/A 12/15/2017   Procedure: CARDIOVERSION;  Surgeon: Elease Hashimoto Deloris Ping, MD;  Location: [facility] ENDOSCOPY;  Service: Cardiovascular;  Laterality: N/A;   CARDIOVERSION N/A 05/12/2022   Procedure: CARDIOVERSION;  Surgeon: Quintella Reichert, MD;  Location: [facility] ENDOSCOPY;  Service: Cardiovascular;  Laterality: N/A;   CARDIOVERSION N/A 09/30/2022   Procedure: CARDIOVERSION;  Surgeon: Thurmon Fair, MD;  Location: [facility];  Service: Cardiovascular;  Laterality: N/A;   CARDIOVERSION N/A 10/29/2022   Procedure: CARDIOVERSION;  Surgeon: Quintella Reichert, MD;  Location: [facility];  Service: Cardiovascular;  Laterality: N/A;   CHOLECYSTECTOMY OPEN  02/20/1989   COLONOSCOPY  06/23/2003   Dr. Karilyn Cota: pancolonic divericula, polyp, path unknown currently   COLONOSCOPY  06/22/2010   Dr. Darrick Penna: Normal TI, scattered diverticula in entire colon, small internal hemorrhoids, normal colon biopsies. Colonoscopy in 5-10 years.    COLONOSCOPY WITH PROPOFOL N/A 06/02/2021   pancolonic diverticulosis. Two 4-6 mm polyps in transverse colon. Sessile serrated and hyperplastic. 5 year surveillance if benefits outweigh the risks.   COLOSTOMY  05/23/1979   COLOSTOMY REVERSAL  11/21/1979   EP IMPLANTABLE DEVICE N/A 06/25/2016   Procedure: Lead Revision/Repair;  Surgeon: Will Jorja Loa, MD;  Location: [facility];  Service: Cardiovascular;  Laterality: N/A;   EP IMPLANTABLE DEVICE N/A 06/25/2016   Procedure: Pacemaker Implant;  Surgeon: Will Jorja Loa, MD;   Location: [facility];  Service: Cardiovascular;  Laterality: N/A;   EXCISIONAL HEMORRHOIDECTOMY  02/20/1969   EYE SURGERY Left 06/22/1998   "branch vein occlusion"   EYE SURGERY Left ~ 2001   "smoothed out wrinkle"   LEFT HEART CATH AND CORONARY ANGIOGRAPHY N/A 05/11/2022   Procedure: LEFT HEART CATH AND CORONARY ANGIOGRAPHY;  Surgeon: Kathleene Hazel, MD;  Location: [facility];  Service: Cardiovascular;  Laterality: N/A;   LEFT OOPHORECTOMY  05/23/1979   nicked bowel, peritonitis, colostomy; colostomy reversed 1981    LOWER EXTREMITY ANGIOGRAM  N/A 01/03/2014   Procedure: LOWER EXTREMITY ANGIOGRAM;  Surgeon: Iran Ouch, MD;  Location: [facility] CATH LAB;  Service: Cardiovascular;  Laterality: N/A;   Nuclear med stress test  10/21/2011   Small area of mild ischemia inferoapically.   PARTIAL HYSTERECTOMY  02/20/1969   left ovaries, then ovaries removed later due tumors    POLYPECTOMY  06/02/2021   Procedure: POLYPECTOMY;  Surgeon: Lanelle Bal, DO;  Location: [facility]O [facility];  Service: Endoscopy;;   right eye surgery  03/2021   RIGHT OOPHORECTOMY  02/20/1969   TEE WITHOUT CARDIOVERSION N/A 05/12/2022   Procedure: TRANSESOPHAGEAL ECHOCARDIOGRAM (TEE);  Surgeon: Quintella Reichert, MD;  Location: [facility] ENDOSCOPY;  Service: Cardiovascular;  Laterality: N/A;   TEE WITHOUT CARDIOVERSION N/A 08/14/2022   Procedure: TRANSESOPHAGEAL ECHOCARDIOGRAM;  Surgeon: Regan Lemming, MD;  Location: [facility] INVASIVE CV LAB;  Service: Cardiovascular;  Laterality: N/A;   TEE WITHOUT CARDIOVERSION N/A 12/08/2022   Procedure: TRANSESOPHAGEAL ECHOCARDIOGRAM;  Surgeon: Regan Lemming, MD;  Location: [facility] INVASIVE CV LAB;  Service: Cardiovascular;  Laterality: N/A;   Social History   Socioeconomic History   Marital status: Married    Spouse name: Not on file   Number of children: Not on file   Years of education: Not on file   Highest education level: Not on file  Occupational History    Occupation: Retired    Associate Professor: RETIRED    Comment: insurance billing  Tobacco Use   Smoking status: Never    Passive exposure: Never   Smokeless tobacco: Never   Tobacco comments:    Never smoke 10/08/22  Vaping Use   Vaping status: Never Used  Substance and Sexual Activity   Alcohol use: No    Alcohol/week: 0.0 standard drinks of alcohol   Drug use: No   Sexual activity: Never    Birth control/protection: Surgical    Comment: hyst  Other Topics Concern   Not on file  Social History Narrative   Not on file   Social Drivers of Health   Financial Resource Strain: Not on file  Food Insecurity: No Food Insecurity (04/13/2023)   Hunger Vital Sign    Worried About Running Out of Food in the Last Year: Never true    Ran Out of Food in the Last Year: Never true  Transportation Needs: No Transportation Needs (04/13/2023)   PRAPARE - Administrator, Civil Service (Medical): No    Lack of Transportation (Non-Medical): No  Physical Activity: Not on file  Stress: Not on file  Social Connections: Not on file  Intimate Partner Violence: Not At Risk (04/13/2023)   Humiliation, Afraid, Rape, and Kick questionnaire    Fear of Current or Ex-Partner: No    Emotionally Abused: No    Physically Abused: No    Sexually Abused: No   Current Outpatient Medications on File Prior to Visit  Medication Sig Dispense Refill   acetaminophen (TYLENOL) 500 MG tablet Take 1,000 mg by mouth every 8 (eight) hours as needed for headache, moderate pain or mild pain.     ALPRAZolam (XANAX) 0.25 MG tablet Take 1-2 tablets (0.25-0.5 mg total) by mouth at bedtime as needed for sleep (neuropathy). 60 tablet 0   atorvastatin (LIPITOR) 80 MG tablet TAKE ONE TABLET BY MOUTH EVERY DAY AT 6:00PM 90 tablet 3   calcitRIOL (ROCALTROL) 0.25 MCG capsule Take 0.25 mcg by mouth every Monday, Wednesday, and Friday.     carvedilol (COREG) 25 MG tablet Take 1 tablet (  25 mg total) by mouth 2 (two) times daily.  60 tablet 3   Cholecalciferol 50 MCG (2000 UT) TABS Take 4,000 Units by mouth at bedtime.     clopidogrel (PLAVIX) 75 MG tablet TAKE ONE TABLET (75MG  TOTAL) BY MOUTH DAILY 90 tablet 3   Continuous Blood Gluc Receiver (FREESTYLE LIBRE 2 READER) DEVI 1 each by Does not apply route daily. 1 each 0   Continuous Glucose Sensor (FREESTYLE LIBRE 2 SENSOR) MISC USE ONE FOR FOURTEEN DAYS 6 each 3   ELIQUIS 5 MG TABS tablet TAKE ONE TABLET BY MOUTH TWICE DAILY 60 tablet 5   furosemide (LASIX) 40 MG tablet Take 40 mg by mouth daily as needed for fluid or edema.     glucose blood (FREESTYLE PRECISION NEO TEST) test strip Use as instructed 100 each 3   Insulin NPH, Human,, Isophane, (NOVOLIN N FLEXPEN) 100 UNIT/ML Kiwkpen Inject 15 units subcutaneously in the morning & inject 4 units subcutaneously at night.     levothyroxine (SYNTHROID) 112 MCG tablet TAKE ONE TABLET BY MOUTH ONCE DAILY 90 tablet 2   nitroGLYCERIN (NITROSTAT) 0.4 MG SL tablet Place 1 tablet (0.4 mg total) under the tongue every 5 (five) minutes x 3 doses as needed for chest pain (If not relief after 3rd dose, call 911 or go to ED). 25 tablet 0   olmesartan (BENICAR) 5 MG tablet Take 1 tablet (5 mg total) by mouth daily. (Patient taking differently: Take 10 mg by mouth daily.) 30 tablet 4   rOPINIRole (REQUIP) 0.5 MG tablet Take 1 tablet (0.5 mg total) by mouth at bedtime. 30 tablet 5   Semaglutide, 1 MG/DOSE, (OZEMPIC, 1 MG/DOSE,) 4 MG/3ML SOPN Inject 1 mg into the skin once a week. 3 mL 2   No current facility-administered medications on file prior to visit.   Allergies  Allergen Reactions   Penicillins Hives    Has patient had a PCN reaction causing immediate rash, facial/tongue/throat swelling, SOB or lightheadedness with hypotension: Yes Has patient had a PCN reaction causing severe rash involving mucus membranes or skin necrosis: No Has patient had a PCN reaction that required hospitalization No Has patient had a PCN reaction  occurring within the last 10 years: No If all of the above answers are "NO", then may proceed with Cephalosporin use.   Percocet [Oxycodone-Acetaminophen] Nausea And Vomiting   Family History  Problem Relation Age of Onset   Heart disease Mother        deceased   Heart disease Father        deceased, heart disease   Diabetes Brother    Heart disease Brother    Thyroid disease Brother    Heart disease Sister    Heart disease Brother    Thyroid disease Brother    Lupus Daughter    Colon cancer Neg Hx    PE: BP 126/70   Pulse 83   Ht 5\' 3"  (1.6 m)   Wt 129 lb (58.5 kg)   SpO2 95%   BMI 22.85 kg/m  Wt Readings from Last 3 Encounters:  08/04/23 129 lb (58.5 kg)  07/27/23 130 lb 9.6 oz (59.2 kg)  05/17/23 128 lb (58.1 kg)   Constitutional: normal weight, in NAD Eyes: no exophthalmos ENT: no masses palpated in neck, no cervical lymphadenopathy Cardiovascular: Irregularly irregular rhythm, RR, No MRG Respiratory: CTA B Musculoskeletal: no deformities Skin: no rashes Neurological: + tremor with outstretched hands - L>R  ASSESSMENT: 1. DM2, insulin-dependent, now more controlled, withcomplications -  CAD, s/p NSTEMI 07/2015 and 03/2015, s/p PTCA and DES - CHF - PAF/flutter - PAD - cerebrovascular disease - s/p TIA - CKD  2. HL  3.  Hypothyroidism  PLAN:  1. Patient with longstanding, previously uncontrolled type 2 diabetes, with improved control on intermediate acting insulin twice a day and weekly GLP-1 receptor agonist.  Her sugars improved after switching from Trulicity to Ozempic.  At last visit, she had decreased appetite and lost approximately 15 pounds. She had lows overnight.  I advised her to reduce the Ozempic dose and also the NPH dose at night.  I also advised her to take the NPH at bedtime to reduce the risk of nighttime hypoglycemia.  HbA1c at last visit was higher, but still at goal, and 6.4%. -we previously discussed about adding an SGLT2 inhibitor in the  past but I advised her to check with her nephrologist first.  She saw Dr. Wolfgang Phoenix in 04/2020 and he wanted [place] to hold off using an SGLT2 inhibitor as her kidney function was trending down.  CGM interpretation: -At today's visit, we reviewed her CGM downloads: It appears that 72% of values are in target range (goal >70%), while 28% are higher than 180 (goal <25%), and 0% are lower than 70 (goal <4%).  The calculated average blood sugar is 161.  The projected HbA1c for the next 3 months (GMI) is above 7%. -Reviewing the CGM trends, sugars appear to be higher in the normal range at night, but they increase significantly after breakfast, with almost all of the values above 180 and fluctuating blood sugars afterwards.  She does have hyperglycemic spikes even up to 300s.  Upon questioning, she has been off Ozempic for 2 weeks and she will be off for another 2 weeks until her cardioversion. -At today's visit we discussed about switching from NPH to a basal/bolus insulin regimen with Lantus in a.m. and ultra-rapid acting insulin before brunch and dinner.  We did discuss that after starting back on Ozempic, she may be able to stop the mealtime insulin. - I suggested to:  Patient Instructions  Please change from NPH to: - Lantus 20 units in am  Start: - Lyumjev 4-6 units before brunch and dinner  When cleared by cardiology, restart: - Ozempic 1 mg weekly   After you restart Ozempic, we may be able to stop Lyumjev.  Continue Levothyroxine 112 mcg daily.  Take the thyroid hormone every day, with water, at least 30 minutes before breakfast, separated by at least 4 hours from: - acid reflux medications - calcium - iron - multivitamins  Please return in 3 months.  - we checked her HbA1c: 7.5% (higher) - advised to check sugars at different times of the day - 4x a day, rotating check times - advised for yearly eye exams >> she is UTD - return to clinic in 3 months  2. HL -Latest lipid panel showed  an LDL and triglycerides at goal and HDL slightly low: Lab Results  Component Value Date   CHOL 96 05/10/2022   HDL 30 (L) 05/10/2022   LDLCALC 35 05/10/2022   TRIG 156 (H) 05/10/2022   CHOLHDL 3.2 05/10/2022  -Continues on Lipitor 80 mg daily without side effects -She had another lipid panel with PCP earlier this month-records are pending  3.  Hypothyroidism - latest thyroid labs reviewed with pt. >> normal: Lab Results  Component Value Date   TSH 1.170 03/23/2023  - she continues on LT4 112 mcg daily - pt feels  good on this dose. - we discussed about taking the thyroid hormone every day, with water, >30 minutes before breakfast, separated by >4 hours from acid reflux medications, calcium, iron, multivitamins. Pt. is taking it correctly.  Carlus Pavlov, MD PhD [facility] Endocrinology

## 2023-08-05 ENCOUNTER — Encounter: Payer: Self-pay | Admitting: Internal Medicine

## 2023-08-06 MED ORDER — LANTUS SOLOSTAR 100 UNIT/ML ~~LOC~~ SOPN: 20.0000 [IU] | PEN_INJECTOR | Freq: Every day | SUBCUTANEOUS | 2 refills | Status: DC

## 2023-08-06 NOTE — Addendum Note (Signed)
Addended by: Pollie Meyer on: 08/06/2023 01:49 PM   Modules accepted: Orders

## 2023-08-08 NOTE — Pre-Procedure Instructions (Signed)
Spoke with patient about Ozempic,  she is aware to hold it 7 days before her cardioverison.

## 2023-08-09 DIAGNOSIS — E113411 Type 2 diabetes mellitus with severe nonproliferative diabetic retinopathy with macular edema, right eye: Secondary | ICD-10-CM | POA: Diagnosis not present

## 2023-08-09 DIAGNOSIS — H353124 Nonexudative age-related macular degeneration, left eye, advanced atrophic with subfoveal involvement: Secondary | ICD-10-CM | POA: Diagnosis not present

## 2023-08-09 DIAGNOSIS — H353111 Nonexudative age-related macular degeneration, right eye, early dry stage: Secondary | ICD-10-CM | POA: Diagnosis not present

## 2023-08-09 DIAGNOSIS — H35041 Retinal micro-aneurysms, unspecified, right eye: Secondary | ICD-10-CM | POA: Diagnosis not present

## 2023-08-09 DIAGNOSIS — H34831 Tributary (branch) retinal vein occlusion, right eye, with macular edema: Secondary | ICD-10-CM | POA: Diagnosis not present

## 2023-08-09 DIAGNOSIS — H43391 Other vitreous opacities, right eye: Secondary | ICD-10-CM | POA: Diagnosis not present

## 2023-08-09 LAB — HM DIABETES EYE EXAM

## 2023-08-10 ENCOUNTER — Telehealth: Payer: Self-pay | Admitting: Nurse Practitioner

## 2023-08-10 NOTE — Telephone Encounter (Signed)
Called patient- left a detailed message (DPR) stating that pt had echo in October of 2024 and per providers recommendations, did not need a new one at this time. Advised pt to contact our office for any further questions or concerns she may have.

## 2023-08-10 NOTE — Progress Notes (Signed)
Chart reviewed. BP is at goal. No medication changes at this time. Continue current treatment plan.   Follow-up as scheduled.   Thanks!   Best,  Sharlene Dory, NP

## 2023-08-10 NOTE — Telephone Encounter (Signed)
Patient is requesting an order for an echo. She states she received a notification about having an echo in March. Please advise.

## 2023-08-13 NOTE — Progress Notes (Signed)
Patient returned phone call and confirmed time of procedure, to continue their eliquis,and confirmed they stopped their ozempic. Last dose of ozempic end of January.

## 2023-08-13 NOTE — Progress Notes (Signed)
Called patient with pre-procedure instructions for Monday August 16, 2023. Left message with following instruction:   Time to arrive for procedure 0815 Remain NPO past midnight.  Must have a ride home and a responsible adult to remain with them for 24 hours post procedure.  Confirmed blood thinner. Confirmed no breaks in taking blood thinner for 3+ weeks prior to procedure. Confirmed patient stopped all GLP-1s and GLP-2s for at least one week before procedure.

## 2023-08-15 NOTE — Anesthesia Preprocedure Evaluation (Signed)
 Anesthesia Evaluation  Patient identified by MRN, date of birth, ID band Patient awake    Reviewed: Allergy & Precautions, H&P , NPO status , Patient's Chart, lab work & pertinent test results  Airway Mallampati: II  TM Distance: >3 FB Neck ROM: Full    Dental  (+) Dental Advisory Given, Teeth Intact   Pulmonary sleep apnea    Pulmonary exam normal breath sounds clear to auscultation       Cardiovascular hypertension, + CAD, + Past MI, + Cardiac Stents and +CHF  + dysrhythmias Atrial Fibrillation + pacemaker  Rhythm:Irregular Rate:Normal  Echo 03/2023  1. Left ventricular ejection fraction, by estimation, is 45 to 50%. The left ventricle has mildly decreased function. The left ventricle demonstrates global hypokinesis. The left ventricular internal cavity size was mildly dilated. There is mild concentric left ventricular hypertrophy. Left ventricular diastolic parameters are consistent with Grade II diastolic dysfunction (pseudonormalization).   2. Right ventricular systolic function is normal. The right ventricular size is normal. Tricuspid regurgitation signal is inadequate for assessing PA pressure.   3. Left atrial size was moderately dilated.   4. The mitral valve is degenerative. Trivial mitral valve regurgitation. Moderate mitral annular calcification.   5. The aortic valve is tricuspid. There is mild calcification of the aortic valve. Aortic valve regurgitation is not visualized. Aortic valve sclerosis/calcification is present, without any evidence of aortic stenosis.   6. The inferior vena cava is normal in size with greater than 50% respiratory variability, suggesting right atrial pressure of 3 mmHg.   Comparison(s): Prior images reviewed side by side. LVEF has improved, now in the range of 45-50%.    Echo 11/2022  1. Left ventricular ejection fraction, by estimation, is 35 to 40%. The left ventricle has moderately decreased  function. The left ventricular internal cavity size was mildly dilated.   2. Right ventricular systolic function is mildly reduced. The right ventricular size is normal.   3. Left atrial size was severely dilated. No left atrial/left atrial appendage thrombus was detected.   4. Right atrial size was mild to moderately dilated.   5. The mitral valve is normal in structure. Trivial mitral valve regurgitation.   6. The aortic valve is normal in structure. There is mild calcification of the aortic valve. Aortic valve regurgitation is not visualized.   7. Evidence of atrial level shunting detected by color flow Doppler.      Neuro/Psych TIA   GI/Hepatic ,GERD  ,,  Endo/Other  diabetes, Type 2Hypothyroidism    Renal/GU Renal InsufficiencyRenal disease     Musculoskeletal  (+) Arthritis ,    Abdominal   Peds  Hematology  (+) Blood dyscrasia, anemia   Anesthesia Other Findings   Reproductive/Obstetrics                              Anesthesia Physical Anesthesia Plan  ASA: 3  Anesthesia Plan: General   Post-op Pain Management: Minimal or no pain anticipated   Induction: Intravenous  PONV Risk Score and Plan: 3 and Treatment may vary due to age or medical condition, Propofol infusion and TIVA  Airway Management Planned: Natural Airway  Additional Equipment:   Intra-op Plan:   Post-operative Plan:   Informed Consent: I have reviewed the patients History and Physical, chart, labs and discussed the procedure including the risks, benefits and alternatives for the proposed anesthesia with the patient or authorized representative who has indicated his/her understanding and acceptance.  Dental advisory given  Plan Discussed with: CRNA  Anesthesia Plan Comments:         Anesthesia Quick Evaluation

## 2023-08-16 ENCOUNTER — Other Ambulatory Visit: Payer: Self-pay

## 2023-08-16 ENCOUNTER — Ambulatory Visit (HOSPITAL_COMMUNITY): Payer: Self-pay | Admitting: Anesthesiology

## 2023-08-16 ENCOUNTER — Encounter (HOSPITAL_COMMUNITY): Payer: Self-pay | Admitting: Cardiovascular Disease

## 2023-08-16 ENCOUNTER — Ambulatory Visit (HOSPITAL_BASED_OUTPATIENT_CLINIC_OR_DEPARTMENT_OTHER): Payer: Self-pay | Admitting: Anesthesiology

## 2023-08-16 ENCOUNTER — Ambulatory Visit (HOSPITAL_COMMUNITY)
Admission: RE | Admit: 2023-08-16 | Discharge: 2023-08-16 | Disposition: A | Discharge status: Home / Self Care | Payer: PPO | Attending: Cardiovascular Disease | Admitting: Cardiovascular Disease

## 2023-08-16 ENCOUNTER — Encounter (HOSPITAL_COMMUNITY)
Admission: RE | Disposition: A | Discharge status: Home / Self Care | Payer: PPO | Source: Home / Self Care | Attending: Cardiovascular Disease

## 2023-08-16 DIAGNOSIS — E039 Hypothyroidism, unspecified: Secondary | ICD-10-CM | POA: Diagnosis not present

## 2023-08-16 DIAGNOSIS — G4733 Obstructive sleep apnea (adult) (pediatric): Secondary | ICD-10-CM | POA: Diagnosis not present

## 2023-08-16 DIAGNOSIS — I5022 Chronic systolic (congestive) heart failure: Secondary | ICD-10-CM | POA: Insufficient documentation

## 2023-08-16 DIAGNOSIS — E1151 Type 2 diabetes mellitus with diabetic peripheral angiopathy without gangrene: Secondary | ICD-10-CM | POA: Diagnosis not present

## 2023-08-16 DIAGNOSIS — Z950 Presence of cardiac pacemaker: Secondary | ICD-10-CM | POA: Insufficient documentation

## 2023-08-16 DIAGNOSIS — I252 Old myocardial infarction: Secondary | ICD-10-CM | POA: Diagnosis not present

## 2023-08-16 DIAGNOSIS — I4819 Other persistent atrial fibrillation: Secondary | ICD-10-CM | POA: Insufficient documentation

## 2023-08-16 DIAGNOSIS — G459 Transient cerebral ischemic attack, unspecified: Secondary | ICD-10-CM

## 2023-08-16 DIAGNOSIS — E119 Type 2 diabetes mellitus without complications: Secondary | ICD-10-CM

## 2023-08-16 DIAGNOSIS — Z794 Long term (current) use of insulin: Secondary | ICD-10-CM | POA: Diagnosis not present

## 2023-08-16 DIAGNOSIS — I130 Hypertensive heart and chronic kidney disease with heart failure and stage 1 through stage 4 chronic kidney disease, or unspecified chronic kidney disease: Secondary | ICD-10-CM | POA: Insufficient documentation

## 2023-08-16 DIAGNOSIS — I2510 Atherosclerotic heart disease of native coronary artery without angina pectoris: Secondary | ICD-10-CM | POA: Diagnosis not present

## 2023-08-16 DIAGNOSIS — Z7901 Long term (current) use of anticoagulants: Secondary | ICD-10-CM | POA: Insufficient documentation

## 2023-08-16 DIAGNOSIS — I5033 Acute on chronic diastolic (congestive) heart failure: Secondary | ICD-10-CM | POA: Diagnosis not present

## 2023-08-16 DIAGNOSIS — I4892 Unspecified atrial flutter: Secondary | ICD-10-CM | POA: Insufficient documentation

## 2023-08-16 DIAGNOSIS — I4891 Unspecified atrial fibrillation: Secondary | ICD-10-CM | POA: Diagnosis not present

## 2023-08-16 DIAGNOSIS — Z7985 Long-term (current) use of injectable non-insulin antidiabetic drugs: Secondary | ICD-10-CM | POA: Insufficient documentation

## 2023-08-16 DIAGNOSIS — I495 Sick sinus syndrome: Secondary | ICD-10-CM | POA: Diagnosis not present

## 2023-08-16 DIAGNOSIS — N1832 Chronic kidney disease, stage 3b: Secondary | ICD-10-CM | POA: Insufficient documentation

## 2023-08-16 DIAGNOSIS — K219 Gastro-esophageal reflux disease without esophagitis: Secondary | ICD-10-CM | POA: Diagnosis not present

## 2023-08-16 DIAGNOSIS — I110 Hypertensive heart disease with heart failure: Secondary | ICD-10-CM | POA: Diagnosis not present

## 2023-08-16 HISTORY — PX: CARDIOVERSION: EP1203

## 2023-08-16 SURGERY — CARDIOVERSION (CATH LAB)
Anesthesia: General

## 2023-08-16 MED ORDER — SODIUM CHLORIDE 0.9% FLUSH: 3.0000 mL | Freq: Two times a day (BID) | INTRAVENOUS | Status: DC

## 2023-08-16 MED ORDER — IRBESARTAN 75 MG PO TABS
75.0000 mg | ORAL_TABLET | ORAL | Status: AC
  Administered 2023-08-16: 75 mg via ORAL
  Filled 2023-08-16: qty 1

## 2023-08-16 MED ORDER — LIDOCAINE 2% (20 MG/ML) 5 ML SYRINGE
INTRAMUSCULAR | Status: DC | PRN
  Administered 2023-08-16: 50 mg via INTRAVENOUS

## 2023-08-16 MED ORDER — SODIUM CHLORIDE 0.9% FLUSH: 3.0000 mL | INTRAVENOUS | Status: DC | PRN

## 2023-08-16 MED ORDER — PROPOFOL 10 MG/ML IV BOLUS
INTRAVENOUS | Status: DC | PRN
  Administered 2023-08-16: 50 mg via INTRAVENOUS

## 2023-08-16 SURGICAL SUPPLY — 1 items: PAD DEFIB RADIO PHYSIO CONN (PAD) ×2 IMPLANT

## 2023-08-16 NOTE — Progress Notes (Signed)
 Called to pts room and pt states she feels better and wants to go home. BP still elevated, MD aware and stated that as long as pt feels ok she can be discharged. Pt educated about checking BP at home and to call her Drs office if any symptoms persist or get worse. Pt verbalized understanding.

## 2023-08-16 NOTE — Anesthesia Postprocedure Evaluation (Signed)
 Anesthesia Post Note  Patient: Virginia Huber  Procedure(s) Performed: CARDIOVERSION     Patient location during evaluation: Cath Lab Anesthesia Type: General Level of consciousness: patient cooperative and awake and alert Pain management: pain level controlled Vital Signs Assessment: post-procedure vital signs reviewed and stable Respiratory status: spontaneous breathing Cardiovascular status: stable Anesthetic complications: no   No notable events documented.  Last Vitals:  Vitals:   08/16/23 1010 08/16/23 1020  BP: (!) 184/96 (!) 188/90  Pulse:    Resp: (!) 23 (!) 21  Temp:    SpO2:      Last Pain:  Vitals:   08/16/23 1020  TempSrc:   PainSc: 0-No pain                 Lewie Loron

## 2023-08-16 NOTE — Transfer of Care (Signed)
 Immediate Anesthesia Transfer of Care Note  Patient: Virginia Huber  Procedure(s) Performed: CARDIOVERSION  Patient Location: PACU and Short Stay  Anesthesia Type:General  Level of Consciousness: sedated  Airway & Oxygen Therapy: Patient Spontanous Breathing  Post-op Assessment: Report given to RN  Post vital signs: Reviewed and stable see chart  Last Vitals:  Vitals Value Taken Time  BP    Temp    Pulse    Resp    SpO2      Last Pain:  Vitals:   08/16/23 0823  TempSrc:   PainSc: 0-No pain         Complications: No notable events documented.

## 2023-08-16 NOTE — Interval H&P Note (Signed)
 History and Physical Interval Note:  08/16/2023 8:41 AM  Virginia Huber  has presented today for surgery, with the diagnosis of afib.  The various methods of treatment have been discussed with the patient and family. After consideration of risks, benefits and other options for treatment, the patient has consented to  Procedure(s): CARDIOVERSION (N/A) as a surgical intervention.  The patient's history has been reviewed, patient examined, no change in status, stable for surgery.  I have reviewed the patient's chart and labs.  Questions were answered to the patient's satisfaction.     [name]

## 2023-08-16 NOTE — Progress Notes (Signed)
 Pts BP remains high, given prescribed avapro. Pt states that she just doesn't feel right at this time. Dr. Eden Emms aware and pt to stay here until she states she feels better. Monitoring BP and pts condition.

## 2023-08-16 NOTE — Anesthesia Procedure Notes (Signed)
 Procedure Name: MAC Date/Time: 08/16/2023 8:51 AM  Performed by: Bartholomew Crews, CRNAPre-anesthesia Checklist: Patient identified, Timeout performed, Emergency Drugs available, Suction available and Patient being monitored Patient Re-evaluated:Patient Re-evaluated prior to induction Preoxygenation: Pre-oxygenation with 100% oxygen Induction Type: IV induction Placement Confirmation: positive ETCO2 Dental Injury: Teeth and Oropharynx as per pre-operative assessment

## 2023-08-16 NOTE — CV Procedure (Signed)
 [facility] Anesthesia: Propofol On Rx DOAC no missed doses  PPM medtronic confirms rapid atrial flutter with V pacing DCC x 1 250 J biphasic converted to NSR rate 72 bpm  No immediate neurologic sequelae  [name] MD [facility]

## 2023-08-17 ENCOUNTER — Encounter: Payer: Self-pay | Admitting: Neurology

## 2023-08-17 ENCOUNTER — Ambulatory Visit: Payer: PPO | Admitting: Nurse Practitioner

## 2023-08-17 NOTE — Progress Notes (Signed)
 Remote pacemaker transmission.

## 2023-08-17 NOTE — Telephone Encounter (Signed)
 Can we get this patient in for a face to face visit in the next 21 days?

## 2023-08-20 NOTE — Progress Notes (Unsigned)
 Electrophysiology Office Note:   Date:  08/23/2023  ID:  Virginia Huber, DOB Nov 04, 1945, MRN [id number]  Primary Cardiologist: Nona Dell, MD Primary Heart Failure: None Electrophysiologist: Will Jorja Loa, MD      History of Present Illness:   Virginia Huber is a 78 y.o. female with h/o SSS s/p PPM, AFL / AF s/ ablation, HFrEF, HTN, CAD, PAD, OSA seen today for routine electrophysiology followup.   Pt underwent DCCV on 08/16/23 for AFL with RVR (controlled V rates).    Since last being seen in our clinic the patient reports she feels she is back out of rhythm. She reports fatigue when out of rhythm and notes she has been particularly fatigued in the last few weeks.  She suspected she was out of rhythm. Confirms no missed doses of anticoagulation.   She denies chest pain, dyspnea, PND, orthopnea, nausea, vomiting, dizziness, syncope, edema, weight gain, or early satiety.   Review of systems complete and found to be negative unless listed in HPI.    EP Information / Studies Reviewed:    EKG is not ordered today. EKG from 08/16/23 reviewed which showed SB with 1st degree AVB, 54 bpm  EKG Interpretation Date/Time:  Monday August 23 2023 11:08:58 EST Ventricular Rate:  63 PR Interval:    QRS Duration:  126 QT Interval:  478 QTC Calculation: 489 R Axis:   -69  Text Interpretation: Atrial flutter with variable A-V block with occasional ventricular-paced complexes Left axis deviation Confirmed by Canary Brim ([id number]) on 08/23/2023 11:19:17 AM   PPM Interrogation-  reviewed in detail today,  See PACEART report.  Device History: Medtronic Dual Chamber PPM implanted 06/25/2016 for Sinus Node Dysfunction  Studies:  ECHO 03/2023 > LVEF 45-50%, GII DD, LA moderately dilated    Arrhythmia / AAD AF  AFL  AF/AFL s/p ablation 08/14/22 DCCV 09/30/22 for AFL  AF ablation 12/08/2022   Amiodarone > stopped due to tremors    Risk Assessment/Calculations:    CHA2DS2-VASc Score = 6    This indicates a 9.7% annual risk of stroke. The patient's score is based upon: CHF History: 1 HTN History: 1 Diabetes History: 0 Stroke History: 0 Vascular Disease History: 1 Age Score: 2 Gender Score: 1           Physical Exam:   VS:  BP (!) 140/60   Pulse 63   Ht 5\' 3"  (1.6 m)   Wt 130 lb (59 kg)   SpO2 95%   BMI 23.03 kg/m    Wt Readings from Last 3 Encounters:  08/23/23 130 lb (59 kg)  08/16/23 130 lb (59 kg)  08/04/23 129 lb (58.5 kg)     GEN: Well nourished, well developed in no acute distress NECK: No JVD; No carotid bruits CARDIAC: Regular rate and rhythm (VP), no murmurs, rubs, gallops RESPIRATORY:  Clear to auscultation without rales, wheezing or rhonchi  ABDOMEN: Soft, non-tender, non-distended EXTREMITIES:  No edema; No deformity   ASSESSMENT AND PLAN:    SND s/p Medtronic PPM  -Normal PPM function -See Pace Art report -No changes today  Persistent Atrial Fibrillation / Atrial Flutter  CHA2DS2-VASc 6, s/p AF/AFL ablation  -continue coreg 25 mg BID -EKG with AFL, controlled V rate -discussed Tikosyn, will get her back to AF clinic for tikosyn review / admission.  She is not a candidate for flecainide due to CAD, intolerant to amiodarone due to tremor, other option for control is Tikosyn.   -will review with Dr. Elberta Fortis  as her last two ablations were RFA, she may be a candidate for pulse field.  Discussed details of Tikosyn and starting medication and subsequent necessary conversation of stopping post ablation > shared decision as initiation requires hospitalization  Secondary Hypercoagulable State  -continue Eliquis 5 BID -will hold reduction until labs for Tikosyn admit > her renal function had been stable until last read, will wait to be sure consistently >1.5 before reduction  -reviewed importance of adherence with known arrhythmia   HFrEF  -GDMT per primary Cardiology   OSA  -CPAP compliance encouraged    Disposition:   Follow up with  Afib Clinic  at next available appt to review for Tikosyn admission   Signed, Canary Brim, NP-C, AGACNP-BC [facility] - Electrophysiology  08/23/2023, 1:20 PM

## 2023-08-23 ENCOUNTER — Ambulatory Visit: Payer: PPO | Attending: Cardiology | Admitting: Pulmonary Disease

## 2023-08-23 ENCOUNTER — Encounter: Payer: Self-pay | Admitting: Pulmonary Disease

## 2023-08-23 VITALS — BP 140/60 | HR 63 | Ht 63.0 in | Wt 130.0 lb

## 2023-08-23 DIAGNOSIS — D6869 Other thrombophilia: Secondary | ICD-10-CM | POA: Diagnosis not present

## 2023-08-23 DIAGNOSIS — G4733 Obstructive sleep apnea (adult) (pediatric): Secondary | ICD-10-CM

## 2023-08-23 DIAGNOSIS — I495 Sick sinus syndrome: Secondary | ICD-10-CM

## 2023-08-23 DIAGNOSIS — I5022 Chronic systolic (congestive) heart failure: Secondary | ICD-10-CM | POA: Diagnosis not present

## 2023-08-23 DIAGNOSIS — I484 Atypical atrial flutter: Secondary | ICD-10-CM | POA: Diagnosis not present

## 2023-08-23 DIAGNOSIS — I4819 Other persistent atrial fibrillation: Secondary | ICD-10-CM | POA: Diagnosis not present

## 2023-08-23 LAB — CUP PACEART INCLINIC DEVICE CHECK
Battery Remaining Longevity: 24 mo
Battery Voltage: 2.94 V
Brady Statistic AP VP Percent: 1.58 %
Brady Statistic AP VS Percent: 11.37 %
Brady Statistic AS VP Percent: 8.22 %
Brady Statistic AS VS Percent: 78.83 %
Brady Statistic RA Percent Paced: 10.63 %
Brady Statistic RV Percent Paced: 9.47 %
Date Time Interrogation Session: 20250303131358
Implantable Lead Connection Status: 753985
Implantable Lead Connection Status: 753985
Implantable Lead Implant Date: 20180104
Implantable Lead Implant Date: 20180104
Implantable Lead Location: 753859
Implantable Lead Location: 753860
Implantable Lead Model: 5076
Implantable Lead Model: 5076
Implantable Pulse Generator Implant Date: 20180104
Lead Channel Impedance Value: 323 Ohm
Lead Channel Impedance Value: 380 Ohm
Lead Channel Impedance Value: 399 Ohm
Lead Channel Impedance Value: 475 Ohm
Lead Channel Pacing Threshold Amplitude: 0.625 V
Lead Channel Pacing Threshold Amplitude: 1.125 V
Lead Channel Pacing Threshold Pulse Width: 0.4 ms
Lead Channel Pacing Threshold Pulse Width: 0.4 ms
Lead Channel Sensing Intrinsic Amplitude: 15.625 mV
Lead Channel Sensing Intrinsic Amplitude: 16.25 mV
Lead Channel Sensing Intrinsic Amplitude: 3.75 mV
Lead Channel Sensing Intrinsic Amplitude: 5.125 mV
Lead Channel Setting Pacing Amplitude: 2 V
Lead Channel Setting Pacing Amplitude: 2.5 V
Lead Channel Setting Pacing Pulse Width: 0.4 ms
Lead Channel Setting Sensing Sensitivity: 2.8 mV
Zone Setting Status: 755011

## 2023-08-23 NOTE — Patient Instructions (Addendum)
 Medication Instructions:  Your physician recommends that you continue on your current medications as directed. Please refer to the Current Medication list given to you today.  *If you need a refill on your cardiac medications before your next appointment, please call your pharmacy*  Lab Work: None ordered.  You may go to any [facility] Location for your lab work:  [facility] - [address] ([facility]) - [address] [phone] [address]  [place] - [address]   [place]  - [address]   [place] - [address] - [address] [employer]  - [address] - [address] ([facility]   If you have labs (blood work) drawn today and your tests are completely normal, you will receive your results only by: [employer] (if you have MyChart)  If you have any lab test that is abnormal or we need to change your treatment, we will call you or send a MyChart message to review the results.  Testing/Procedures: None ordered.  Follow-Up: At [facility], you and your health needs are our priority.  As part of our continuing mission to provide you with exceptional heart care, we have created designated Provider Care Teams.  These Care Teams include your primary Cardiologist (physician) and Advanced Practice Providers (APPs -  Physician Assistants and Nurse Practitioners) who all work together to provide you with the care you need, when you need it.   Your next appointment: To be scheduled with Afib clinic  The format for your next appointment:   In Person             Valet parking services will be available as well.    Tikosyn (Dofetilide) Hospital Admission   Prior to day of admission:  Check with drug insurance company for cost of drug to ensure affordability --- Dofetilide 500 mcg twice a day.  GoodRx is an option if insurance copay is unaffordable.     No Benadryl is allowed 3 days prior to admission.   Please ensure no missed doses of your anticoagulation (blood thinner) for 3 weeks prior to admission. If a dose is missed please notify our office immediately.   A pharmacist will review all your medications for potential interactions with Tikosyn. If any medication changes are needed prior to admission we will be in touch with you.   If any new medications are started AFTER your admission date is set with Radio [facility]. Please notify our office immediately so your medication list can be updated and reviewed by our pharmacist again.  On day of admission:  Tikosyn initiation requires a 3 night/4 day hospital stay with constant telemetry monitoring. You will have an EKG after each dose of Tikosyn as well as daily lab draws.   If the drug does not convert you to normal rhythm a cardioversion after the 4th dose of Tikosyn.   Afib Clinic office visit on the morning of admission is needed for preliminary labs/ekg.   Time of admission is dependent on bed availability in the hospital. In some instances, you will be sent home until bed is available. Rarely admission can be delayed to the following day if hospital census prevents available beds.   You may bring personal belongings/clothing with you to the hospital. Please leave your suitcase in the car until you arrive in admissions.   Questions please call our  office at [phone]

## 2023-08-24 ENCOUNTER — Telehealth: Payer: Self-pay | Admitting: Pharmacist

## 2023-08-24 ENCOUNTER — Ambulatory Visit: Payer: PPO | Admitting: Neurology

## 2023-08-24 NOTE — Telephone Encounter (Signed)
 Medication list reviewed in anticipation of upcoming Tikosyn initiation. Patient is not taking any contraindicated medications.  Furosemide can increase the risk of QTc prolongation due to its effects on electrolytes. Close monitoring of potassium and magnesium will be needed.  Alprazolam- can increase the concentration of Tikosyn. Close monitoring is recommended.  Patient is anticoagulated on Eliquis on the appropriate dose. Please ensure that patient has not missed any anticoagulation doses in the 3 weeks prior to Tikosyn initiation. Her last scr was 1.55 (baseline appears to be 1.2) She is 59kg. Would keep on Eliquis 5mg  BID for now.  Patient will need to be counseled to avoid use of Benadryl while on Tikosyn and in the 2-3 days prior to Tikosyn initiation.  She has been off amiodarone since Sept 2024, therefore no level will be needed.

## 2023-08-25 ENCOUNTER — Encounter: Payer: Self-pay | Admitting: Neurology

## 2023-08-25 ENCOUNTER — Ambulatory Visit: Payer: PPO | Admitting: Neurology

## 2023-08-25 VITALS — BP 182/84 | HR 84 | Ht 63.0 in | Wt 133.0 lb

## 2023-08-25 DIAGNOSIS — I5022 Chronic systolic (congestive) heart failure: Secondary | ICD-10-CM | POA: Diagnosis not present

## 2023-08-25 DIAGNOSIS — I4891 Unspecified atrial fibrillation: Secondary | ICD-10-CM | POA: Diagnosis not present

## 2023-08-25 DIAGNOSIS — Z794 Long term (current) use of insulin: Secondary | ICD-10-CM

## 2023-08-25 DIAGNOSIS — G452 Multiple and bilateral precerebral artery syndromes: Secondary | ICD-10-CM | POA: Diagnosis not present

## 2023-08-25 DIAGNOSIS — G620 Drug-induced polyneuropathy: Secondary | ICD-10-CM | POA: Insufficient documentation

## 2023-08-25 DIAGNOSIS — E1121 Type 2 diabetes mellitus with diabetic nephropathy: Secondary | ICD-10-CM | POA: Diagnosis not present

## 2023-08-25 NOTE — Progress Notes (Signed)
 Provider:  Melvyn Novas, MD  Primary Care Physician:  Assunta Found, MD [address] [place] [address]     Referring Provider: Assunta Found, Md [address] [place],  [place] [address]          Chief Complaint according to patient   Patient presents with:          Fatigue : SOB       HISTORY OF PRESENT ILLNESS:  Virginia Huber is a 78 y.o. female patient who is here for revisit 08/25/2023 for  follow up on multiple tests with our office, labs.  We are meeting to summarize our testing that has been done through neurology and through my orders.  The patient underwent a nerve conduction EMG study with Dr. Karma Lew here in the office she presented with 2 years of gradual onset unsteady gait, bilateral paresthesias in her feet chronic lower back pain but also increasing fatigue and even shortness of breath with presyncope.  Nerve conduction study showed a moderately slow conduction and decreased amplitude the peroneal sensory responses were absent.  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  What this means is it either medication or endocrine related it is not an autoimmune transmitted pathway.  Her MRI of the brain showed no active strokes but she has suffered multiple lacune's in both brain hemispheres in the past.  These affect the area around the basal ganglia and there is chronic mild ischemic disease.  Mild generalized cerebral atrophy which is age-appropriate.  The lacunar infarcts main risk factor is hypertension but for her small vessel disease atrial fibrillation and decreased perfusion is also a mechanism to consider.  MRI of the cervical spine was performed November 5 and showed no significant stenosis, she had some some small disc protrusions none of them interfaced with the spinal cord or compressed and  exiting nerve.  She is followed in our clinic originally for sleep apnea and she is on CPAP highly compliant patient 97% compliance for the last 30 days with an average use at time of 8 hours 7 minutes.  CPAP is set at 10 cm water and achieved a residual AHI of 3.3/h, there are equal amounts of obstructive and central events among the residuals.  She does not have a high air leak so her mask seems to be fitting well.  She endorsed the Epworth Sleepiness Scale at 4 points, fatigue severity at 38 points and this can clearly be related to her cardiac condition.  Geriatric depression scale was endorsed at 1 out of 15 points.  The patient is concerned but she is not clinically depressed.  Lab review with Dr. Lamar Blinks practice showed normal white blood cell count a slight very slight decrease in MCHC.  Her neutrophils were ever so slightly increased at 7.2 when the normal lab limits is 7.0.  This was a nonfasting test creatinine was 1.25 and BUN was 19 so this is not all related to hydration.  It is an elevation of the creatinine with a reduction in glomerular filtration rate.  The immunoglobulin study showed an IgG to be decreased at 560 and this again is mild and not significant in the context of a lack of spike protein there was no Ig M.  Vitamin B12 was in normal range homocystine level was normal, ENA antibodies were normal,  C-reactive protein was less than 1 vitamin B6 was normal thiamine was normal.  ANA was negative.  So based on this there is no significant vasculitis, there have been small lacunar strokes in the past but no acute development over the last year, the small vessel disease is likely related to output.  Her cardiac condition is the most likely explanation for her fatigue, clumsiness.  Her medication side effects of amiodarone used to treat atrial fibrillation is well-known to cause sensorimotor polyneuropathy. .  Chief concern according to patient :"  I have ongoing cardiac problems, I have  had 4 unsuccessful cardioversions.   I had 2 ablations. The cardiologists want me to switch from amiodarone to Tikosyn. "  I think it all comes back to my heart. My pacemaker check revealed that the left ventricle is racing and the atrium is not in synch" . I am fatigued because of this .       Review of Systems: Out of a complete 14 system review, the patient complains of only the following symptoms, and all other reviewed systems are negative.:   HTN today, main risk factor for lacunes  Atrial fib, causing fatigue and presyncope  Amiodarone causes neuropathy.   Social History   Socioeconomic History   Marital status: Married    Spouse name: Not on file   Number of children: Not on file   Years of education: Not on file   Highest education level: Not on file  Occupational History   Occupation: Retired    Associate Professor: RETIRED    Comment: insurance billing  Tobacco Use   Smoking status: Never    Passive exposure: Never   Smokeless tobacco: Never   Tobacco comments:    Never smoke 10/08/22  Vaping Use   Vaping status: Never Used  Substance and Sexual Activity   Alcohol use: No    Alcohol/week: 0.0 standard drinks of alcohol   Drug use: No   Sexual activity: Never    Birth control/protection: Surgical    Comment: hyst  Other Topics Concern   Not on file  Social History Narrative   Not on file   Social Drivers of Health   Financial Resource Strain: Not on file  Food Insecurity: No Food Insecurity (04/13/2023)   Hunger Vital Sign    Worried About Running Out of Food in the Last Year: Never true    Ran Out of Food in the Last Year: Never true  Transportation Needs: No Transportation Needs (04/13/2023)   PRAPARE - Administrator, Civil Service (Medical): No    Lack of Transportation (Non-Medical): No  Physical Activity: Not on file  Stress: Not on file  Social Connections: Not on file    Family History  Problem Relation Age of Onset   Heart disease  Mother        deceased   Heart disease Father        deceased, heart disease   Diabetes Brother    Heart disease Brother    Thyroid disease Brother    Heart disease Sister    Heart disease Brother    Thyroid disease Brother    Lupus Daughter    Colon cancer Neg Hx     Past Medical History:  Diagnosis Date   (HFpEF) heart failure with preserved ejection fraction (HCC)    Advanced nonexudative age-related macular degeneration of left eye with subfoveal involvement 06/26/2020   Ongoing, accounts for acuity   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 h/o PAF/flutter during admission in 2013 for PNA. b. PAF during adm for NSTEMI 07/2015, subsequent paroxysms since then.   B12 deficiency anemia    Branch retinal vein occlusion with macular edema of right eye 10/11/2019   Chronic renal failure, stage 3b (HCC) 09/23/2017   Coronary artery disease 11/30/2014   a. remote MI. b. h/o PTCA with scoring balloon to OM1 11/2014. c. NSTEMI 03/2015 s/p DES to prox-mid Cx. d. NSTEMI 07/2015 s/p scoring balloon/PTCA/DES to dRCA with PAF during that admission   Cutaneous lupus erythematosus    Early stage nonexudative age-related macular degeneration of right eye 02/27/2020   Essential hypertension    GERD (gastroesophageal reflux disease)    History of blood transfusion 1980's   2nd surgical procedures   Hypercholesteremia    Hypothyroidism    Myocardial infarction (HCC) 02/2012   NSTEMI (non-ST elevated myocardial infarction) (HCC) 04/02/2015   OSA (obstructive sleep apnea) 05/13/2016   Ovarian tumor    PAD (peripheral artery disease) (HCC)    a. s/p LE angio 2015; followed by Dr. Kirke Corin - managed medically.   Pericardial effusion    a. 06/2016 after ppm - s/p pericardiocentesis.   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 s/p Medtr[employer] PPM 06/2016, c/b lead perf/pericardial effusion.   TIA (transient ischemic attack)    Type 2 diabetes with nephropathy (HCC) 02/29/2012    Past Surgical History:  Procedure Laterality Date   A-FLUTTER ABLATION N/A 12/08/2022   Procedure: A-FLUTTER ABLATION;  Surgeon: Regan Lemming, MD;  [facility]: [facility];  Service: Cardiovascular;  Laterality: N/A;   ABDOMINAL AORTAGRAM N/A 01/03/2014   Procedure: ABDOMINAL Ronny Flurry;  Surgeon: Iran Ouch, MD;  Location: [facility]ATH [facility];  Service: Cardiovascular;  Laterality: N/A;   ABDOMINAL HYSTERECTOMY  06/22/1970   "partial"   APPENDECTOMY  02/20/1969   ATRIAL FIBRILLATION ABLATION N/A 08/14/2022   Procedure: ATRIAL FIBRILLATION ABLATION;  Surgeon: Regan Lemming, MD;  [facility]: [facility];  Service: Cardiovascular;  Laterality: N/A;   ATRIAL FIBRILLATION ABLATION N/A 12/08/2022   Procedure: ATRIAL FIBRILLATION ABLATION;  Surgeon: Regan Lemming, MD;  [facility]: [facility];  Service: Cardiovascular;  Laterality: N/A;   CARDIAC CATHETERIZATION  06/22/2006   Tiny OM-2 with 90% narrowing. Med tx.   CARDIAC CATHETERIZATION N/A 11/30/2014   Procedure: Left Heart Cath and Coronary Angiography;  Surgeon: Lennette Bihari, MD; LAD 20%, CFX 50%, OM1 95%, right PLB 30%, LV normal    CARDIAC CATHETERIZATION N/A 11/30/2014   Procedure: Coronary Balloon Angioplasty;  Surgeon: Lennette Bihari, MD;  Angiosculpt scoring balloon and PTCA to the OM1 reducing stenosis from 95% to less than 10%   CARDIAC CATHETERIZATION N/A 04/03/2015   Procedure: Left Heart Cath and Coronary Angiography;  Surgeon: Dolores Patty, MD; dLAD 50%, CFX 90%, OM1 100%, PLA 15%, LVEDP  13     CARDIAC CATHETERIZATION N/A 04/03/2015   Procedure: Coronary Stent Intervention;  Surgeon: Tonny Bollman, MD; 3.0x18 mm Xience DES to the CFX     CARDIAC CATHETERIZATION N/A  08/02/2015   Procedure: Left Heart Cath and Coronary Angiography;  Surgeon: Lennette Bihari, MD;  Location: [facility] INVASIVE CV LAB;  Service: Cardiovascular;  Laterality: N/A;   CARDIAC CATHETERIZATION N/A 08/02/2015   Procedure: Coronary Stent Intervention;  Surgeon: Lennette Bihari, MD;  [facility];  Service: Cardiovascular;  Laterality: N/A;   CARDIAC CATHETERIZATION N/A 06/25/2016   Procedure: Pericardiocentesis;  Surgeon: Will Jorja Loa, MD;  [facility]: [facility];  Service: Cardiovascular;  Laterality: N/A;   cardiac stents     CARDIOVERSION N/A 12/15/2017   Procedure: CARDIOVERSION;  Surgeon: Elease Hashimoto Deloris Ping, MD;  Location: [facility] ENDOSCOPY;  Service: Cardiovascular;  Laterality: N/A;   CARDIOVERSION N/A 05/12/2022   Procedure: CARDIOVERSION;  Surgeon: Quintella Reichert, MD;  Location: [facility] ENDOSCOPY;  Service: Cardiovascular;  Laterality: N/A;   CARDIOVERSION N/A 09/30/2022   Procedure: CARDIOVERSION;  Surgeon: Thurmon Fair, MD;  [facility];  Service: Cardiovascular;  Laterality: N/A;   CARDIOVERSION N/A 10/29/2022   Procedure: CARDIOVERSION;  Surgeon: Quintella Reichert, MD;  [facility];  Service: Cardiovascular;  Laterality: N/A;   CARDIOVERSION N/A 08/16/2023   Procedure: CARDIOVERSION;  Surgeon: Wendall Stade, MD;  [facility];  Service: Cardiovascular;  Laterality: N/A;   CHOLECYSTECTOMY OPEN  02/20/1989   COLONOSCOPY  06/23/2003   Dr. Karilyn Cota: pancolonic divericula, polyp, path unknown currently   COLONOSCOPY  06/22/2010   Dr. Darrick Penna: Normal TI, scattered diverticula in entire colon, small internal hemorrhoids, normal colon biopsies. Colonoscopy in 5-10 years.    COLONOSCOPY WITH PROPOFOL N/A 06/02/2021   pancolonic diverticulosis.  Two 4-6 mm polyps in transverse colon. Sessile serrated and hyperplastic. 5 year surveillance if benefits outweigh the risks.   COLOSTOMY  05/23/1979   COLOSTOMY REVERSAL  11/21/1979   EP IMPLANTABLE DEVICE N/A 06/25/2016   Procedure: Lead Revision/Repair;  Surgeon: Will Jorja Loa, MD;  [facility];  Service: Cardiovascular;  Laterality: N/A;   EP IMPLANTABLE DEVICE N/A 06/25/2016   Procedure: Pacemaker Implant;  Surgeon: Will Jorja Loa, MD;  [facility]: [facility];  Service: Cardiovascular;  Laterality: N/A;   EXCISIONAL HEMORRHOIDECTOMY  02/20/1969   EYE SURGERY Left 06/22/1998   "branch vein occlusion"   EYE SURGERY Left ~ 2001   "smoothed out wrinkle"   LEFT HEART CATH AND CORONARY ANGIOGRAPHY N/A 05/11/2022   Procedure: LEFT HEART CATH AND CORONARY ANGIOGRAPHY;  Surgeon: Kathleene Hazel, MD;  [facility]: [facility];  Service: Cardiovascular;  Laterality: N/A;   LEFT OOPHORECTOMY  05/23/1979   nicked bowel, peritonitis, colostomy; colostomy reversed 1981    LOWER EXTREMITY ANGIOGRAM N/A 01/03/2014   Procedure: LOWER EXTREMITY ANGIOGRAM;  Surgeon: Iran Ouch, MD;  Location: [facility] C[facility];  Service: Cardiovascular;  Laterality: N/A;   Nuclear med stress test  10/21/2011   Small area of mild ischemia inferoapically.   PARTIAL HYSTERECTOMY  02/20/1969   left ovaries, then ovaries removed later due tumors    POLYPECTOMY  06/02/2021   Procedure: POLYPECTOMY;  Surgeon: Lanelle Bal, DO;  Location: [facility];  Service: Endoscopy;;   right eye surgery  03/2021   RIGHT OOPHORECTOMY  02/20/1969   TEE WITHOUT CARDIOVERSION N/A 05/12/2022   Procedure: TRANSESOPHAGEAL ECHOCARDIOGRAM (TEE);  Surgeon: Quintella Reichert, MD;  Location: [facility] ENDOSCOPY;  Service: Cardiovascular;  Laterality: N/A;   TEE WITHOUT CARDIOVERSION N/A 08/14/2022   Procedure: TRANSESOPHAGEAL ECHOCARDIOGRAM;  Surgeon: Regan Lemming, MD;  [facility];   Service: Cardiovascular;  Laterality: N/A;   TEE WITHOUT CARDIOVERSION N/A 12/08/2022   Procedure: TRANSESOPHAGEAL ECHOCARDIOGRAM;  Surgeon: Regan Lemming, MD;  Location: [facility] INVASIVE CV LAB;  Service: Cardiovascular;  Laterality: N/A;     Current Outpatient Medications on File Prior to Visit  Medication Sig Dispense Refill   acetaminophen (TYLENOL) 500 MG tablet Take 1,000 mg by mouth every 8 (eight) hours as needed for headache, moderate pain or mild pain.     ALPRAZolam (XANAX) 0.25 MG tablet Take 1-2 tablets (0.25-0.5 mg total) by mouth at bedtime as needed for sleep (neuropathy). 60 tablet 0   atorvastatin (LIPITOR) 80 MG tablet TAKE ONE TABLET BY MOUTH EVERY DAY AT 6:00PM 90 tablet 3   calcitRIOL (ROCALTROL) 0.25 MCG capsule Take 0.25 mcg by mouth every Monday, Wednesday, and Friday.     carvedilol (COREG) 25 MG tablet Take 1 tablet (25 mg total) by mouth 2 (two) times daily. 60 tablet 3   Cholecalciferol 50 MCG (2000 UT) TABS Take 2,000 Units by mouth at bedtime.     clopidogrel (PLAVIX) 75 MG tablet TAKE ONE TABLET (75MG  TOTAL) BY MOUTH DAILY 90 tablet 3   Continuous Blood Gluc Receiver (FREESTYLE LIBRE 2 READER) DEVI 1 each by Does not apply route daily. 1 each 0   Continuous Glucose Sensor (FREESTYLE LIBRE 2 SENSOR) MISC USE ONE FOR FOURTEEN DAYS 6 each 3   cyanocobalamin 2000 MCG tablet Take 2,000 mcg by mouth daily.     ELIQUIS 5 MG TABS tablet TAKE ONE TABLET BY MOUTH TWICE DAILY 60 tablet 5   furosemide (LASIX) 40 MG tablet Take 40 mg by mouth daily as needed for fluid or edema.     glucose blood (FREESTYLE PRECISION NEO TEST) test strip Use as instructed 100 each 3   insulin glargine (LANTUS SOLOSTAR) 100 UNIT/ML Solostar Pen Inject 20 Units into the skin daily. 15 mL 2   Insulin Lispro-aabc (LYUMJEV KWIKPEN) 100 UNIT/ML KwikPen Inject 4-6 units under skin before the 2 main meals of the day 15 mL 3   levothyroxine (SYNTHROID) 112 MCG tablet TAKE ONE TABLET BY MOUTH ONCE  DAILY 90 tablet 2   nitroGLYCERIN (NITROSTAT) 0.4 MG SL tablet Place 1 tablet (0.4 mg total) under the tongue every 5 (five) minutes x 3 doses as needed for chest pain (If not relief after 3rd dose, call 911 or go to ED). 25 tablet 0   olmesartan (BENICAR) 5 MG tablet Take 5-10 mg by mouth 2 (two) times daily. 10 mg in am and 5 mg in pm     rOPINIRole (REQUIP) 0.5 MG tablet TAKE ONE TABLET BY MOUTH AT BEDTIME 30 tablet 2   Semaglutide, 1 MG/DOSE, (OZEMPIC, 1 MG/DOSE,) 4 MG/3ML SOPN Inject 1 mg into the skin once a week. 3 mL 2   No current facility-administered medications on file prior to visit.    Allergies  Allergen Reactions   Penicillins Hives   Percocet [Oxycodone-Acetaminophen] Nausea And Vomiting     DIAGNOSTIC DATA (LABS, IMAGING, TESTING) - I reviewed patient records, labs, notes, testing and imaging myself where available.  Lab Results  Component Value Date   WBC 7.9 07/27/2023   HGB 12.2 07/27/2023   HCT 39.3 07/27/2023   MCV 94.2 07/27/2023   PLT 201 07/27/2023      Component Value Date/Time   NA 139 07/27/2023 1108   NA 141 07/08/2023 1428   K 3.6 07/27/2023 1108   CL  104 07/27/2023 1108   CO2 24 07/27/2023 1108   GLUCOSE 241 (H) 07/27/2023 1108   BUN 22 07/27/2023 1108   BUN 15 07/08/2023 1428   CREATININE 1.55 (H) 07/27/2023 1108   CREATININE 1.11 (H) 12/31/2017 0850   CALCIUM 8.8 (L) 07/27/2023 1108   PROT 6.1 03/23/2023 1135   ALBUMIN 4.1 03/23/2023 1135   AST 22 03/23/2023 1135   ALT 22 03/23/2023 1135   ALKPHOS 89 03/23/2023 1135   BILITOT 0.6 03/23/2023 1135   GFRNONAA 34 (L) 07/27/2023 1108   GFRNONAA 43 (L) 08/05/2017 0807   GFRAA 39 (L) 08/26/2019 0901   GFRAA 50 (L) 08/05/2017 0807   Lab Results  Component Value Date   CHOL 96 05/10/2022   HDL 30 (L) 05/10/2022   LDLCALC 35 05/10/2022   TRIG 156 (H) 05/10/2022   CHOLHDL 3.2 05/10/2022   Lab Results  Component Value Date   HGBA1C 7.5 (A) 08/04/2023   Lab Results  Component Value  Date   VITAMINB12 401 03/23/2023   Lab Results  Component Value Date   TSH 1.170 03/23/2023    PHYSICAL EXAM:  Today's Vitals   08/25/23 1131  BP: (!) 182/84  Pulse: 84  Weight: 133 lb (60.3 kg)  Height: 5\' 3"  (1.6 m)   Body mass index is 23.56 kg/m.   Wt Readings from Last 3 Encounters:  08/25/23 133 lb (60.3 kg)  08/23/23 130 lb (59 kg)  08/16/23 130 lb (59 kg)     Ht Readings from Last 3 Encounters:  08/25/23 5\' 3"  (1.6 m)  08/23/23 5\' 3"  (1.6 m)  08/16/23 5\' 3"  (1.6 m)      General: The patient is awake, alert and appears not in acute distress. The patient is well groomed. Head: Normocephalic, atraumatic. The patient is awake, alert and appears not in acute distress. The patient is well groomed. Head: Normocephalic, atraumatic. Neck is supple. Dental status:  Cardiovascular:  Regular rate and cardiac rhythm by pulse,  without distended neck veins. Respiratory: Lungs are clear to auscultation.  Skin:  Without evidence of ankle edema, or rash. Trunk: The patient's posture is erect.   NEUROLOGIC EXAM: The patient is awake and alert, oriented to place and time.   Memory subjective described as intact.  Attention span & concentration ability appears normal.  Speech is fluent,  without  dysarthria, dysphonia or aphasia.  Mood and affect are appropriate.   Cranial nerves: no loss of smell or taste reported  Pupils are equal and briskly reactive to light. Funduscopic exam :"Lazy eye left "- just a sliver of peripheral vision after retinal vein occlusion. Eye diverts out and up-  Extraocular movements in vertical and horizontal planes were intact and without nystagmus. No Diplopia. Compensatory head movements.  Visual fields by finger perimetry only on the right intact.  Hearing was intact to soft voice and finger rubbing.    Facial sensation intact to fine touch.  Facial motor strength is symmetric and tongue and uvula move midline. No tongue tremor.  Neck ROM :  rotation, tilt and flexion extension were normal for age and shoulder shrug was symmetrical.    Motor exam:  Symmetric bulk, tone and ROM.   She fell twice in December - Was conscious, sudden fall, not mechanical. (" Legs just buckled"  )   Normal tone without cog-wheeling, symmetric grip strength .  No weakness of hip flexor or foot flexion, extension.    Sensory:  Fine touch, pinprick and vibration were  tested  and  absent below the knee.  Polyneuropathy for sensory and motor .   Pinprick, fine touch and vibration were all impaired in both feet and temperature was barely felt , too.  Proprioception tested in the upper extremities was normal.   Coordination: Rapid alternating movements in the fingers/hands were of reduced  speed.  The Finger-to-nose maneuver was showing  high amplitude tremor, satellite movements and was worse on the left, non-dominant hand.  Left sided evidence of ataxia, dysmetria / tremor. Very mild tremor with outstretched hands, no ataxia.    Gait and station: Patient could rise unassisted from a seated position, walked without assistive device.  Stance is of  wider width/ base and the patient turned with 4 steps.   She was swaying to the right- she was unable to perform a tandem walk, there is ataxia.  Toe and heel walk were impaired  Deep tendon reflexes: in the  upper and lower extremities are symmetric and intact.  Babinski response was deferred /   ASSESSMENT AND PLAN 78 y.o. year old female  here with:  Excessive fatigue, increasing falls.     1) atrial fibrillation , intractable , treated on amiodarone , causing fatigue, systolic output failure, diastolic  failure.   Amiodarone : now with neuropathy related to medication/  with overlap of NP related to DM / NP  related to hypothyroidism. [name]   2)  lacunar infarcts throughout the brain, risk factor is HTN.  3) well treated OSA, no spinal or brain acute changes, no myopathy on EMG, neuropathy confirmed by  NCV.      I plan to follow up  through our NP within 10-12 months for CPAP/ OSA .   I would like to thank Assunta Found, MD and Dr Elberta Fortis, MD , for allowing me to meet with and to take care of this pleasant patient.     After spending a total time of  35  minutes face to face and additional time for physical and neurologic examination, review of laboratory studies,  personal review of imaging studies, reports and results of other testing and review of referral information / records as far as provided in visit,   Electronically signed by: Melvyn Novas, MD 08/25/2023 12:08 PM  [facility] Board certified by The [employer] of Sleep Medicine and Diplomate of the [employer] of [employer]. Board certified In Neurology through the ABPN, Fellow of the [employer] of [employer].

## 2023-08-25 NOTE — Patient Instructions (Signed)
 Peripheral Neuropathy Peripheral neuropathy is a type of nerve damage. It affects nerves that carry signals between the spinal cord and the arms, legs, and the rest of the body (peripheral nerves). It does not affect nerves in the spinal cord or brain. In peripheral neuropathy, one nerve or a group of nerves may be damaged. Peripheral neuropathy is a broad category that includes many specific nerve disorders, like diabetic neuropathy, hereditary neuropathy, and carpal tunnel syndrome. What are the causes? This condition may be caused by: Certain diseases, such as: Diabetes. This is the most common cause of peripheral neuropathy. Autoimmune diseases, such as rheumatoid arthritis and systemic lupus erythematosus. Nerve diseases that are passed from parent to child (inherited). Kidney disease. Thyroid disease. Other causes may include: Nerve injury. Pressure or stress on a nerve that lasts a long time. Lack (deficiency) of B vitamins. This can result from alcoholism, poor diet, or a restricted diet. Infections. Some medicines, such as cancer medicines (chemotherapy). Poisonous (toxic) substances, such as lead and mercury. Too little blood flowing to the legs. In some cases, the cause of this condition is not known. What are the signs or symptoms? Symptoms of this condition depend on which of your nerves is damaged. Symptoms in the legs, hands, and arms can include: Loss of feeling (numbness) in the feet, hands, or both. Tingling in the feet, hands, or both. Burning pain. Very sensitive skin. Weakness. Not being able to move a part of the body (paralysis). Clumsiness or poor coordination. Muscle twitching. Loss of balance. Symptoms in other parts of the body can include: Not being able to control your bladder. Feeling dizzy. Sexual problems. How is this diagnosed? Diagnosing and finding the cause of peripheral neuropathy can be difficult. Your health care provider will take your  medical history and do a physical exam. A neurological exam will also be done. This involves checking things that are affected by your brain, spinal cord, and nerves (nervous system). For example, your health care provider will check your reflexes, how you move, and what you can feel. You may have other tests, such as: Blood tests. Electromyogram (EMG) and nerve conduction tests. These tests check nerve function and how well the nerves are controlling the muscles. Imaging tests, such as a CT scan or MRI, to rule out other causes of your symptoms. Removing a small piece of nerve to be examined in a lab (nerve biopsy). Removing and examining a small amount of the fluid that surrounds the brain and spinal cord (lumbar puncture). How is this treated? Treatment for this condition may involve: Treating the underlying cause of the neuropathy, such as diabetes, kidney disease, or vitamin deficiencies. Stopping medicines that can cause neuropathy, such as chemotherapy. Medicine to help relieve pain. Medicines may include: Prescription or over-the-counter pain medicine. Anti-seizure medicine. Antidepressants. Pain-relieving patches that are applied to painful areas of skin. Surgery to relieve pressure on a nerve or to destroy a nerve that is causing pain. Physical therapy to help improve movement and balance. Devices to help you move around (assistive devices). Follow these instructions at home: Medicines Take over-the-counter and prescription medicines only as told by your health care provider. Do not take any other medicines without first asking your health care provider. Ask your health care provider if the medicine prescribed to you requires you to avoid driving or using machinery. Lifestyle  Do not use any products that contain nicotine or tobacco. These products include cigarettes, chewing tobacco, and vaping devices, such as e-cigarettes. Smoking keeps  blood from reaching damaged nerves. If you  need help quitting, ask your health care provider. Avoid or limit alcohol. Too much alcohol can cause a vitamin B deficiency, and vitamin B is needed for healthy nerves. Eat a healthy diet. This includes: Eating foods that are high in fiber, such as beans, whole grains, and fresh fruits and vegetables. Limiting foods that are high in fat and processed sugars, such as fried or sweet foods. General instructions  If you have diabetes, work closely with your health care provider to keep your blood sugar under control. If you have numbness in your feet: Check every day for signs of injury or infection. Watch for redness, warmth, and swelling. Wear padded socks and comfortable shoes. These help protect your feet. Develop a good support system. Living with peripheral neuropathy can be stressful. Consider talking with a mental health specialist or joining a support group. Use assistive devices and attend physical therapy as told by your health care provider. This may include using a walker or a cane. Keep all follow-up visits. This is important. Where to find more information [employer] of Neurological Disorders: [employer] Contact a health care provider if: You have new signs or symptoms of peripheral neuropathy. You are struggling emotionally from dealing with peripheral neuropathy. Your pain is not well controlled. Get help right away if: You have an injury or infection that is not healing normally. You develop new weakness in an arm or leg. You have fallen or do so frequently. Summary Peripheral neuropathy is when the nerves in the arms or legs are damaged, resulting in numbness, weakness, or pain. There are many causes of peripheral neuropathy, including diabetes, pinched nerves, vitamin deficiencies, autoimmune disease, and hereditary conditions. Diagnosing and finding the cause of peripheral neuropathy can be difficult. Your health care provider will take your medical  history, do a physical exam, and do tests, including blood tests and nerve function tests. Treatment involves treating the underlying cause of the neuropathy and taking medicines to help control pain. Physical therapy and assistive devices may also help. This information is not intended to replace advice given to you by your health care provider. Make sure you discuss any questions you have with your health care provider. Document Revised: 02/11/2021 Document Reviewed: 02/11/2021 Elsevier Patient Education  2024 [employer].

## 2023-08-27 ENCOUNTER — Encounter: Payer: Self-pay | Admitting: Podiatry

## 2023-08-27 ENCOUNTER — Ambulatory Visit (INDEPENDENT_AMBULATORY_CARE_PROVIDER_SITE_OTHER): Payer: PPO | Admitting: Podiatry

## 2023-08-27 VITALS — Ht 63.0 in | Wt 133.0 lb

## 2023-08-27 DIAGNOSIS — B351 Tinea unguium: Secondary | ICD-10-CM | POA: Diagnosis not present

## 2023-08-27 DIAGNOSIS — Z7901 Long term (current) use of anticoagulants: Secondary | ICD-10-CM

## 2023-08-27 DIAGNOSIS — E119 Type 2 diabetes mellitus without complications: Secondary | ICD-10-CM

## 2023-08-27 DIAGNOSIS — M79674 Pain in right toe(s): Secondary | ICD-10-CM

## 2023-08-27 DIAGNOSIS — M79675 Pain in left toe(s): Secondary | ICD-10-CM

## 2023-08-27 NOTE — Progress Notes (Signed)
  Subjective:  Patient ID: Virginia Huber, female    DOB: [birth year],  MRN: [id number]  Chief Complaint  Patient presents with   [facility]    "She is here for diabetic nail trim and last A1C was 7.0"    78 y.o. female presents with the above complaint. History confirmed with patient. Patient presenting with pain related to dystrophic thickened elongated nails. Patient is unable to trim own nails related to nail dystrophy and/or mobility issues. Patient does have a history of T2DM.  Last A1c 7.5.  Objective:  Physical Exam: warm, good capillary refill nail exam onychomycosis of the toenails, onycholysis, and dystrophic nails DP pulses palpable, PT pulses palpable, and protective sensation intact Left Foot:  Pain with palpation of nails due to elongation and dystrophic growth.  Right Foot: Pain with palpation of nails due to elongation and dystrophic growth.   Assessment:   1. Pain due to onychomycosis of toenails of both feet   2. Diabetes mellitus without complication (HCC)   3. Anticoagulated        Plan:  Patient was evaluated and treated and all questions answered.  #Onychomycosis with pain  -Nails palliatively debrided as below. -Educated on self-care -Chronically anticoagulated on Eliquis  Procedure: Nail Debridement Rationale: Pain Type of Debridement: manual, sharp debridement. Instrumentation: Nail nipper, rotary burr. Number of Nails: 10  Return in about 3 months (around 11/27/2023) for Diabetic Foot Care.         Joyel Chenette L. Jamse Arn, DPM [facility] / [facility]

## 2023-09-03 ENCOUNTER — Encounter (HOSPITAL_COMMUNITY): Payer: Self-pay

## 2023-09-07 ENCOUNTER — Inpatient Hospital Stay (HOSPITAL_COMMUNITY): Admit: 2023-09-07

## 2023-09-07 ENCOUNTER — Inpatient Hospital Stay (HOSPITAL_COMMUNITY): Admission: RE | Admit: 2023-09-07 | Source: Ambulatory Visit | Admitting: Internal Medicine

## 2023-09-09 ENCOUNTER — Encounter: Payer: Self-pay | Admitting: Nurse Practitioner

## 2023-09-09 ENCOUNTER — Ambulatory Visit: Attending: Nurse Practitioner | Admitting: Nurse Practitioner

## 2023-09-09 ENCOUNTER — Ambulatory Visit: Payer: PPO | Admitting: Nurse Practitioner

## 2023-09-09 ENCOUNTER — Encounter: Payer: Self-pay | Admitting: *Deleted

## 2023-09-09 VITALS — BP 123/61 | HR 84 | Ht 63.0 in | Wt 133.0 lb

## 2023-09-09 DIAGNOSIS — I951 Orthostatic hypotension: Secondary | ICD-10-CM | POA: Diagnosis not present

## 2023-09-09 DIAGNOSIS — I739 Peripheral vascular disease, unspecified: Secondary | ICD-10-CM | POA: Diagnosis not present

## 2023-09-09 DIAGNOSIS — I4819 Other persistent atrial fibrillation: Secondary | ICD-10-CM

## 2023-09-09 DIAGNOSIS — I10 Essential (primary) hypertension: Secondary | ICD-10-CM | POA: Diagnosis not present

## 2023-09-09 DIAGNOSIS — I484 Atypical atrial flutter: Secondary | ICD-10-CM | POA: Diagnosis not present

## 2023-09-09 DIAGNOSIS — I5022 Chronic systolic (congestive) heart failure: Secondary | ICD-10-CM

## 2023-09-09 DIAGNOSIS — G4733 Obstructive sleep apnea (adult) (pediatric): Secondary | ICD-10-CM

## 2023-09-09 DIAGNOSIS — Z950 Presence of cardiac pacemaker: Secondary | ICD-10-CM | POA: Diagnosis not present

## 2023-09-09 DIAGNOSIS — I2510 Atherosclerotic heart disease of native coronary artery without angina pectoris: Secondary | ICD-10-CM

## 2023-09-09 NOTE — Patient Instructions (Addendum)
Medication Instructions:   Continue all current medications.   Labwork:  none  Testing/Procedures:  none  Follow-Up:  4-6 months   Any Other Special Instructions Will Be Listed Below (If Applicable).   If you need a refill on your cardiac medications before your next appointment, please call your pharmacy.

## 2023-09-09 NOTE — Progress Notes (Signed)
 Have requested most recent labs from PCP's office.   Sharlene Dory, NP

## 2023-09-09 NOTE — Progress Notes (Addendum)
 Cardiology Office Note:    Date:  09/09/2023  ID:  Virginia Huber, DOB 02-05-1946, MRN [id number] PCP:  Assunta Found, MD [facility] Providers Cardiologist:  Nona Dell, MD Electrophysiologist:  Will Jorja Loa, MD  Referring MD: Assunta Found, MD   CC: Here for follow-up  History of Present Illness:    Virginia Huber is a 78 y.o. female with a PMH of CAD, s/p angioplasty to OM1 in 2016, DES to Cx in 2016, and DES to RCA in 2017, PAF, PAD, HTN, HFpEF, OSA, CKD stage 3b (Follows Nephrology), SSS, s/p Med[employer] PPM in 2018, and hx of orthostatic hypotension (past hx of fall), who presents today for scheduled follow-up.  Seen by Dr. Diona Browner 02/2022.  Was reportedly doing much better, denied any palpitations.  Saw Dr. Elberta Fortis later that month and reported orthostatic dizziness a few times/week, also occurred at other times as well. Was holding medicines, somewhat helped.  SBP's later on were averaging 170s, referred to hypertension clinic. Amio seemed to make tremors worse, but was willing to continue if Multaq was unaffordable.  Visited ED with substernal CP 04/2022, was SHOB/ with diaphoresis. Had a mechanical fall 5 days PTA, went to UC due to L-sided rib pain, x-rays negative. Troponin 20 >> 24.  CXR negative.  EKG revealed ST, with IVCD.  Transferred to [facility]. Underwent LHC that revealed mild to moderate diffuse disease of LAD without focal stenosis, patent stents, with chronic occlusion of small caliber first OM branch. Small caliber distal PL artery beyond the stented segment, moderately severe disease at bifurcation, vessels were too small for PCI.  Medical management recommended.  TTE revealed EF 40 to 45%, RWMA's of left ventricle, severe concentric LVH, trivial MR. Successful TEE with DCCV on 11/21. TEE revealed EF 50-55%, severe LVH.   Pre-procedure TEE prior to ablation 07/2022 showed EF 30-35%, mild MR. Pt had shortness of breath, admitted for further observation,  given IV lasix and CXR showed cardiomegaly with interstitial edema, small effusion, CHF favored with LLL collapse/consolidation. SOB resolved and post-op bilateral groins oozed blood, eventually resolved. After PVI, converted to NSR on Amiodarone.   I last saw patient for follow-up on August 31, 2022. She was doing better since previous office visit. Denied any chest pain, shortness of breath, palpitations, syncope, presyncope, dizziness, orthopnea, PND, swelling or significant weight changes, acute bleeding, or claudication. Still felt fatigued. Denied any other issues.   Since I last saw patient she underwent A-fib ablation in June 2024.  Has been followed by EP and A-fib clinic.  Was noted to have some tremulousness and amiodarone was stopped, slightly improved after stopping amiodarone.  Was to be evaluated by neurology at the time.  Hospitalized for chest pain in October 2024.  Was noted to have a UTI and hypokalemia, both were treated.  Echocardiogram revealed improved LVEF at 45 to 50%.  Troponins were reassuring.  05/17/2023 - Today she presents for scheduled follow-up and hospital follow-up.  She states she is doing well and denies any CP since leaving the hospital. Denies any chest pain, shortness of breath, palpitations, syncope, presyncope, dizziness, orthopnea, PND, swelling or significant weight changes, acute bleeding, or claudication.  She underwent cardioversion in February 2025, however patient returned back out of rhythm after cardioversion.  09/09/2023 -tells me she is now in persistent A-fib, tells me she is being followed closely by electrophysiology.  Saw electrophysiology APP on 08/23/2023.  Patient says [name] was discussed however she said Dr. Elberta Fortis said this  would not be a good medication for her.  Was mentioned at this visit and she may be a candidate for pulsed field ablation. Denies any chest pain, shortness of breath, palpitations, syncope, presyncope, dizziness, orthopnea,  PND, swelling or significant weight changes, acute bleeding, or claudication.  ROS:    Please see the history of present illness.    All other systems reviewed and are negative.  EKGs/Labs/Other Studies Reviewed:    The following studies were reviewed today:   EKG:  EKG is not ordered today.   Echo 03/2023:  1. Left ventricular ejection fraction, by estimation, is 45 to 50%. The  left ventricle has mildly decreased function. The left ventricle  demonstrates global hypokinesis. The left ventricular internal cavity size was mildly dilated. There is mild concentric left ventricular hypertrophy. Left ventricular diastolic parameters are consistent with Grade II diastolic dysfunction (pseudonormalization).   2. Right ventricular systolic function is normal. The right ventricular  size is normal. Tricuspid regurgitation signal is inadequate for assessing PA pressure.   3. Left atrial size was moderately dilated.   4. The mitral valve is degenerative. Trivial mitral valve regurgitation.  Moderate mitral annular calcification.   5. The aortic valve is tricuspid. There is mild calcification of the  aortic valve. Aortic valve regurgitation is not visualized. Aortic valve  sclerosis/calcification is present, without any evidence of aortic  stenosis.   6. The inferior vena cava is normal in size with greater than 50%  respiratory variability, suggesting right atrial pressure of 3 mmHg.   Comparison(s): Prior images reviewed side by side. LVEF has improved, now in the range of 45-50%.  TEE on 05/12/2022: 1. Left ventricular ejection fraction, by estimation, is 50 to 55%. The  left ventricle has low normal function. The left ventricle has no regional  wall motion abnormalities. There is severe concentric left ventricular  hypertrophy.   2. Right ventricular systolic function is normal. The right ventricular  size is normal.   3. Left atrial size was mildly dilated. No left atrial/left atrial   appendage thrombus was detected. The LAA emptying velocity was 16 cm/s.   4. The mitral valve is normal in structure. Mild mitral valve  regurgitation. No evidence of mitral stenosis.   5. The aortic valve is tricuspid. Aortic valve regurgitation is not  visualized. Aortic valve sclerosis/calcification is present, without any  evidence of aortic stenosis.   6. The inferior vena cava is normal in size with greater than 50%  respiratory variability, suggesting right atrial pressure of 3 mmHg.   Conclusion(s)/Recommendation(s): Normal biventricular function without  evidence of hemodynamically significant valvular heart disease.   Left heart cath on 05/11/22:   Prox Cx to Mid Cx lesion is 10% stenosed.   1st Mrg lesion is 100% stenosed.   Ost LAD to Mid LAD lesion is 30% stenosed.   Mid LAD to Dist LAD lesion is 30% stenosed.   Ost RCA to Prox RCA lesion is 40% stenosed.   RPAV-1 lesion is 20% stenosed.   RPAV-2 lesion is 70% stenosed.   2nd RPL lesion is 70% stenosed.   The LAD is a moderate caliber vessel that courses to the apex. There appears to be diffuse mild to moderate disease in the entire LAD without a focal stenosis.  2. The Circumflex has a patent proximal stented segment. This vessel terminates into an obtuse marginal branch. The small caliber first obtuse marginal branch is chronically occluded.  3. The RCA is a large caliber dominant vessel  with a patent stent in the posterolateral artery. The small caliber distal posterolateral artery beyond the stented segment has moderately severe disease at a bifurcation but the vessels appear too small for PCI.  4. Normal LVEDP   Recommendations: Medical management of CAD. Consider adding Imdur or Ranexa for angina in setting of diffuse small vessel disease.    2D Echo complete on 05/10/2022: 1. Severe hypokinesis of the anteroseptal wall with overall mild LV  dysfunction.   2. Left ventricular ejection fraction, by estimation, is  40 to 45%. The  left ventricle has mildly decreased function. The left ventricle  demonstrates regional wall motion abnormalities (see scoring  diagram/findings for description). There is severe  concentric left ventricular hypertrophy. Left ventricular diastolic  parameters are indeterminate.   3. Right ventricular systolic function is normal. The right ventricular  size is normal.   4. The mitral valve is normal in structure. Trivial mitral valve  regurgitation. No evidence of mitral stenosis.   5. The aortic valve is tricuspid. Aortic valve regurgitation is not  visualized. Aortic valve sclerosis is present, with no evidence of aortic  valve stenosis.   6. The inferior vena cava is normal in size with greater than 50%  respiratory variability, suggesting right atrial pressure of 3 mmHg.  Lexiscan on 01/20/2018: Downsloping ST segment depression ST segment depression of 0.5 mm was noted during stress in the II, III, aVF, V5 and V6 leads. Defect 1: There is a small defect of mild severity present in the apical anterior and apical septal location. This likely represents soft tissue attenuation. A small degree of scar cannot entirely be excluded. No signficant ischemia. This is a low risk study. Nuclear stress EF: 64%.  Left heart cath on 08/02/2015: 1st RPLB lesion, 50% stenosed. Dist RCA lesion, 30% stenosed. Ost 1st Mrg to 1st Mrg lesion, 100% stenosed. Ost LAD lesion, 40% stenosed. Mid LAD lesion, 40% stenosed. Post Atrio lesion, 90% stenosed. Post intervention, there is a 0% residual stenosis. The left ventricular systolic function is normal. Mid RCA lesion, 20% stenosed.   Normal LV function without focal segmental wall motion abnormalities and ejection fraction of 55%.   Significant coronary obstructive disease with diffuse mild luminal narrowing of the LAD with 40% proximal and mid stenoses; widely patent stent in the proximal circumflex with old occlusion of the marginal branch  which had arisen in the region of the stented segment but with evidence for very faint collateralization to this distal marginal vessel from the the LAD; a large dominant RCA with 20%, mild mid narrowing, 30% narrowing after the acute margin and focal 90% stenosis distally prior to a PDA 2 vessel with 50% narrowing at the ostium of this PDA prior to a bend in the vessel with the RCA ending in the PLA vessel.   Successful percutaneous cardiac intervention to the distal RCA treated with Angiosculpt scoring balloon, PTCA, and ultimate stenting with a 2.58 mm Xience Alpine DES stent postdilated to 2.51 mm with a 90% stenosis reduced to 0% and no change in the ostial PDA narrowing.   RECOMMENDATION: Since the patient suffered a non-ST segment elevation myocardial infarction on Plavix, Brilinta was administered for oral antiplatelet therapy.  It was felt most likely the patient's atrial fibrillation was ischemic mediated.  If anticoagulation is necessary, Brilinta will need to be discontinued and changed back to Plavix due to potential bleed risk if  triple therapy is needed short-term.  Risk Assessment/Calculations:    CHA2DS2-VASc Score = 6  This indicates a 9.7% annual risk of stroke. The patient's score is based upon: CHF History: 1 HTN History: 1 Diabetes History: 0 Stroke History: 0 Vascular Disease History: 1 Age Score: 2 Gender Score: 1  Physical Exam:    VS:  BP 123/61 (BP Location: Right Arm, Patient Position: Sitting, Cuff Size: Normal)   Pulse 84   Ht 5\' 3"  (1.6 m)   Wt 133 lb (60.3 kg)   BMI 23.56 kg/m     Wt Readings from Last 3 Encounters:  09/09/23 133 lb (60.3 kg)  08/27/23 133 lb (60.3 kg)  08/25/23 133 lb (60.3 kg)    GEN: Well nourished, well developed in no acute distress HEENT: Normal NECK: No JVD; No carotid bruits CARDIAC: S1/S2, RRR, no murmurs, rubs, gallops; 2+ pulses  RESPIRATORY:  Clear and diminished to auscultation without rales, wheezing or rhonchi   MUSCULOSKELETAL:  No edema; No deformity  SKIN: Thin skin, warm and dry NEUROLOGIC:  Alert and oriented x 3, improved tremors  PSYCHIATRIC:  Normal affect   ASSESSMENT & PLAN:    In order of problems listed above:   Persistent A-fib/A-flutter, s/p PVI ablation 07/2022, long term anticoagulation  Denies any recent tachycardia or palpitations, however does notice going in and out of A-fib. CHA2DS2-VASc score 6. Continue current medication regimen.  Currently she is on appropriate dosage of Eliquis, denies any issues.  Discussed avoiding triggers to A-fib.  Continue rest of current medication regimen.  Medication therapy is limited due to her past history.  Continue to follow-up with up with A-fib clinic and EP.  I agree that she would be a good candidate to consider pulsed field ablation.  CAD, s/p angioplasty to OM1 in 2016, DES to circumflex in 2016, and DES to RCA in 2017 Denies any CP. No indication for ischemic evaluation. Continue current meds. ED precautions discussed. Heart healthy diet and regular cardiovascular exercise encouraged.   3. PAD ABI's in 2015 revealed moderate depression of resting left ABI to 0.66, segmental analysis revealed pattern of tibial and potentially distal SFA/popliteal disease with digital disease also suspected; no evidence of significant arterial occlusive disease in right lower extremity.  She denies any recent claudication symptoms.  Continue current medication regimen.  4. 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 EF 45-50% 03/2023. Euvolemic and well compensated on exam. Low sodium diet, fluid restriction <2L, and daily weights encouraged. Educated to contact our office for weight gain of 2 lbs overnight or 5 lbs in one week.  Did discuss starting Marcelline Deist as this would be a good medication for her as she also has CKD and was also recommended by her nephrologist, however patient wants to wait and think about this.  Continue current  medication regimen. Continue current medication regimen. Heart healthy diet and regular cardiovascular exercise encouraged.   5. Hypertension Blood pressure well controlled. No medication chages at this time. Discussed to monitor BP at home at least 2 hours after medications and sitting for 5-10 minutes. Heart healthy diet and regular cardiovascular exercise encouraged.   6. SSS, s/p Pacemaker In-clinic pacemaker check 08/2023 was normal. Continue to follow up with EP.   7. Orthostatic hypotension Denies any recent symptoms.  Continue current medication regimen.  Continue to follow with PCP.  8. OSA on CPAP Does report wearing CPAP. Continue to follow with PCP.  9.  Disposition: Follow-up with me or Dr. Diona Browner in 4-6 months or sooner if anything changes.    Medication Adjustments/Labs  and Tests Ordered: Current medicines are reviewed at length with the patient today.  Concerns regarding medicines are outlined above.  No orders of the defined types were placed in this encounter.  No orders of the defined types were placed in this encounter.   Patient Instructions  Medication Instructions:   Continue all current medications.   Labwork:  none  Testing/Procedures:  none  Follow-Up:  4-6 months   Any Other Special Instructions Will Be Listed Below (If Applicable).   If you need a refill on your cardiac medications before your next appointment, please call your pharmacy.    Signed, Sharlene Dory, NP

## 2023-09-10 ENCOUNTER — Encounter: Payer: Self-pay | Admitting: Family Medicine

## 2023-09-20 DIAGNOSIS — H353124 Nonexudative age-related macular degeneration, left eye, advanced atrophic with subfoveal involvement: Secondary | ICD-10-CM | POA: Diagnosis not present

## 2023-09-20 DIAGNOSIS — H43391 Other vitreous opacities, right eye: Secondary | ICD-10-CM | POA: Diagnosis not present

## 2023-09-20 DIAGNOSIS — H353111 Nonexudative age-related macular degeneration, right eye, early dry stage: Secondary | ICD-10-CM | POA: Diagnosis not present

## 2023-09-20 DIAGNOSIS — H34831 Tributary (branch) retinal vein occlusion, right eye, with macular edema: Secondary | ICD-10-CM | POA: Diagnosis not present

## 2023-09-20 DIAGNOSIS — E113411 Type 2 diabetes mellitus with severe nonproliferative diabetic retinopathy with macular edema, right eye: Secondary | ICD-10-CM | POA: Diagnosis not present

## 2023-09-20 DIAGNOSIS — H348122 Central retinal vein occlusion, left eye, stable: Secondary | ICD-10-CM | POA: Diagnosis not present

## 2023-09-20 DIAGNOSIS — H35041 Retinal micro-aneurysms, unspecified, right eye: Secondary | ICD-10-CM | POA: Diagnosis not present

## 2023-09-20 LAB — HM DIABETES EYE EXAM

## 2023-09-28 ENCOUNTER — Other Ambulatory Visit (HOSPITAL_COMMUNITY): Payer: Self-pay | Admitting: Adult Health

## 2023-09-28 DIAGNOSIS — Z1231 Encounter for screening mammogram for malignant neoplasm of breast: Secondary | ICD-10-CM

## 2023-10-04 ENCOUNTER — Other Ambulatory Visit: Payer: Self-pay | Admitting: Cardiology

## 2023-10-04 NOTE — Progress Notes (Unsigned)
 Electrophysiology Office Note:   Date:  10/05/2023  ID:  Virginia Huber, DOB 1946/03/01, MRN [id number]  Primary Cardiologist: Teddie Favre, MD Primary Heart Failure: None Electrophysiologist: Melvin Marmo Cortland Ding, MD      History of Present Illness:   Virginia Huber is a 78 y.o. female with h/o sick sinus syndrome's post pacemaker, atrial fibrillation/flutter, chronic systolic heart failure, hypertension, coronary artery disease, peripheral arterial disease, sleep apnea seen today for routine electrophysiology followup.   She has had multiple ablations for atrial fibrillation and atrial flutter.  She had cardioversion 08/16/2023 for atrial flutter.  She is unfortunately had more episodes of atrial fibrillation/flutter.  Since last being seen in our clinic the patient reports continuing atrial arrhythmias.  She feels fatigued and short of breath when she is in her arrhythmia.  Arrhythmia has been persistent for the last few weeks, though she has received atrial ATP today which has resulted in termination of her atrial flutter.  she denies chest pain, palpitations, dyspnea, PND, orthopnea, nausea, vomiting, dizziness, syncope, edema, weight gain, or early satiety.   Review of systems complete and found to be negative unless listed in HPI.      EP Information / Studies Reviewed:    EKG is not ordered today. EKG from 08/23/23 reviewed which showed atrial flutter      PPM Interrogation-  reviewed in detail today,  See PACEART report.  Device History: Medtronic Dual Chamber PPM implanted 06/25/2016 for Sinus Node Dysfunction  Risk Assessment/Calculations:    CHA2DS2-VASc Score = 6   This indicates a 9.7% annual risk of stroke. The patient's score is based upon: CHF History: 1 HTN History: 1 Diabetes History: 0 Stroke History: 0 Vascular Disease History: 1 Age Score: 2 Gender Score: 1             Physical Exam:   VS:  BP 98/60 (BP Location: Left Arm, Patient Position:  Sitting, Cuff Size: Normal)   Pulse 84   Ht 5\' 3"  (1.6 m)   Wt 132 lb (59.9 kg)   SpO2 98%   BMI 23.38 kg/m    Wt Readings from Last 3 Encounters:  10/05/23 132 lb (59.9 kg)  09/09/23 133 lb (60.3 kg)  08/27/23 133 lb (60.3 kg)     GEN: Well nourished, well developed in no acute distress NECK: No JVD; No carotid bruits CARDIAC: Regular rate and rhythm, no murmurs, rubs, gallops RESPIRATORY:  Clear to auscultation without rales, wheezing or rhonchi  ABDOMEN: Soft, non-tender, non-distended EXTREMITIES:  No edema; No deformity   ASSESSMENT AND PLAN:    SND s/p Medtronic PPM  Normal PPM function See Pace Art report No changes today  2.  Persistent atrial fibrillation/flutter: Post ablation.  Currently on carvedilol.  She has had more episodes of atrial fibrillation and flutter.  She is unfortunately continued to have episodes of atrial fibrillation and flutter.  She would like to try and avoid antiarrhythmics.  Additionally, she had tremor with amiodarone and dofetilide makes her quite nervous.  Due to that, we Thien Berka plan for repeat ablation.  Risks and benefits have been discussed.  She understands the risks and has agreed to the procedure.  Risk, benefits, and alternatives to EP study and radiofrequency/pulse field ablation for afib were also discussed in detail today. These risks include but are not limited to stroke, bleeding, vascular damage, tamponade, perforation, damage to the esophagus, lungs, and other structures, pulmonary vein stenosis, worsening renal function, and death. The patient understands these risk  and wishes to proceed.  We Kenry Daubert therefore proceed with catheter ablation at the next available time.  Carto, ICE, anesthesia are requested for the procedure.  Fremon Zacharia also obtain CT PV protocol prior to the procedure to exclude LAA thrombus and further evaluate atrial anatomy.  3.  Secondary hypercoagulable state: Currently on Eliquis for atrial fibrillation  4.  Chronic  systolic heart failure: On medical therapy per primary cardiology  5.  Obstructive sleep apnea: CPAP compliance encouraged  Disposition:   Follow up with Afib Clinic as usual post procedure  Signed, Analissa Bayless Cortland Ding, MD

## 2023-10-05 ENCOUNTER — Encounter: Payer: Self-pay | Admitting: Cardiology

## 2023-10-05 ENCOUNTER — Ambulatory Visit: Attending: Cardiology | Admitting: Cardiology

## 2023-10-05 VITALS — BP 98/60 | HR 84 | Ht 63.0 in | Wt 132.0 lb

## 2023-10-05 DIAGNOSIS — Z79899 Other long term (current) drug therapy: Secondary | ICD-10-CM | POA: Diagnosis not present

## 2023-10-05 DIAGNOSIS — I4819 Other persistent atrial fibrillation: Secondary | ICD-10-CM

## 2023-10-05 DIAGNOSIS — Z01812 Encounter for preprocedural laboratory examination: Secondary | ICD-10-CM

## 2023-10-05 DIAGNOSIS — D6869 Other thrombophilia: Secondary | ICD-10-CM | POA: Diagnosis not present

## 2023-10-05 DIAGNOSIS — I495 Sick sinus syndrome: Secondary | ICD-10-CM

## 2023-10-05 DIAGNOSIS — G4733 Obstructive sleep apnea (adult) (pediatric): Secondary | ICD-10-CM | POA: Diagnosis not present

## 2023-10-05 DIAGNOSIS — I5022 Chronic systolic (congestive) heart failure: Secondary | ICD-10-CM | POA: Diagnosis not present

## 2023-10-05 LAB — CUP PACEART INCLINIC DEVICE CHECK
Date Time Interrogation Session: 20250415094811
Implantable Lead Connection Status: 753985
Implantable Lead Connection Status: 753985
Implantable Lead Implant Date: 20180104
Implantable Lead Implant Date: 20180104
Implantable Lead Location: 753859
Implantable Lead Location: 753860
Implantable Lead Model: 5076
Implantable Lead Model: 5076
Implantable Pulse Generator Implant Date: 20180104

## 2023-10-05 NOTE — Patient Instructions (Signed)
 Medication Instructions:  Your physician recommends that you continue on your current medications as directed. Please refer to the Current Medication list given to you today.  *If you need a refill on your cardiac medications before your next appointment, please call your pharmacy*  Lab Work: BMET today CBC and BMET - please have pre-procedure lab work completed on Wednesday 12/01/2023 . This can be done at ANY [facility] near you - no appointment required and this does not have to be fasting. If you have labs (blood work) drawn today and your tests are completely normal, you will receive your results only by: MyChart Message (if you have MyChart) OR A paper copy in the mail If you have any lab test that is abnormal or we need to change your treatment, we will call you to review the results.  Testing/Procedures: Cardiac CT - someone will contact you to schedule this  Your physician has requested that you have cardiac CT. Cardiac computed tomography (CT) is a painless test that uses an x-ray machine to take clear, detailed pictures of your heart. For further information please visit [email or web address] Please follow instruction sheet as given.   Atrial Fibrillation Ablation - scheduled on 12/22/2023 We will be in contact closer to your ablation date with further instructions Your physician has recommended that you have an ablation. Catheter ablation is a medical procedure used to treat some cardiac arrhythmias (irregular heartbeats). During catheter ablation, a long, thin, flexible tube is put into a blood vessel in your groin (upper thigh), or neck. This tube is called an ablation catheter. It is then guided to your heart through the blood vessel. Radio frequency waves destroy small areas of heart tissue where abnormal heartbeats may cause an arrhythmia to start. Please see the instruction sheet given to you today.  Follow-Up: At [facility], you and your health needs are our priority.   As part of our continuing mission to provide you with exceptional heart care, our providers are all part of one team.  This team includes your primary Cardiologist (physician) and Advanced Practice Providers or APPs (Physician Assistants and Nurse Practitioners) who all work together to provide you with the care you need, when you need it.  Your next appointment:   We will schedule follow up after your ablation  Provider:   Dr Lawana Pray   Cardiac Ablation Cardiac ablation is a procedure to destroy, or ablate, a small amount of heart tissue that is causing problems. The heart has many electrical connections. Sometimes, these connections are abnormal and can cause the heart to beat very fast or irregularly. Ablating the abnormal areas can improve the heart's rhythm or return it to normal. Ablation may be done for people who: Have irregular or rapid heartbeats (arrhythmias). Have Wolff-Parkinson-White syndrome. Have taken medicines for an arrhythmia that did not work or caused side effects. Have a high-risk heartbeat that may be life-threatening. Tell a health care provider about: Any allergies you have. All medicines you are taking, including vitamins, herbs, eye drops, creams, and over-the-counter medicines. Any problems you or family members have had with anesthesia. Any bleeding problems you have. Any surgeries you have had. Any medical conditions you have. Whether you are pregnant or may be pregnant. What are the risks? Your health care provider will talk with you about risks. These may include: Infection. Bruising and bleeding. Stroke or blood clots. Damage to nearby structures or organs. Allergic reaction to medicines or dyes. Needing a pacemaker if the heart gets damaged. A  pacemaker is a device that helps the heart beat normally. Failure of the procedure. A repeat procedure may be needed. What happens before the procedure? Medicines Ask your health care provider about: Changing  or stopping your regular medicines. These include any heart rhythm medicines, diabetes medicines, or blood thinners you take. Taking medicines such as aspirin and ibuprofen. These medicines can thin your blood. Do not take them unless your health care provider tells you to. Taking over-the-counter medicines, vitamins, herbs, and supplements. General instructions Follow instructions from your health care provider about what you may eat and drink. If you will be going home right after the procedure, plan to have a responsible adult: Take you home from the hospital or clinic. You will not be allowed to drive. Care for you for the time you are told. Ask your health care provider what steps will be taken to prevent infection. What happens during the procedure?  An IV will be inserted into one of your veins. You may be given: A sedative. This helps you relax. Anesthesia. This will: Numb certain areas of your body. An incision will be made in your neck or your groin. A needle will be inserted through the incision and into a large vein in your neck or groin. The small, thin tube (catheter) will be inserted through the needle and moved to your heart. A type of X-ray (fluoroscopy) will be used to help guide the catheter and provide images of the heart on a monitor. Dye may be injected through the catheter to help your surgeon see the area of the heart that needs treatment. Electrical currents will be sent from the catheter to destroy heart tissue in certain areas. There are three types of energy that may be used to do this: Heat (radiofrequency energy). Laser energy. Extreme cold (cryoablation). When the tissue has been destroyed, the catheter will be removed. Pressure will be held on the insertion area to prevent bleeding. A bandage (dressing) will be placed over the insertion area. The procedure may vary among health care providers and hospitals. What happens after the procedure? Your blood  pressure, heart rate and rhythm, breathing rate, and blood oxygen level will be monitored until you leave the hospital or clinic. Your insertion area will be checked for bleeding. You will need to lie still for a few hours. If your groin was used, you will need to keep your leg straight for a few hours after the catheter is removed. This information is not intended to replace advice given to you by your health care provider. Make sure you discuss any questions you have with your health care provider. Document Revised: 11/25/2021 Document Reviewed: 11/25/2021 Elsevier Patient Education  2024 [employer].

## 2023-10-06 ENCOUNTER — Ambulatory Visit (INDEPENDENT_AMBULATORY_CARE_PROVIDER_SITE_OTHER): Payer: HMO

## 2023-10-06 DIAGNOSIS — I495 Sick sinus syndrome: Secondary | ICD-10-CM | POA: Diagnosis not present

## 2023-10-06 LAB — BASIC METABOLIC PANEL WITH GFR
BUN/Creatinine Ratio: 17 (ref 12–28)
BUN: 28 mg/dL — ABNORMAL HIGH (ref 8–27)
CO2: 23 mmol/L (ref 20–29)
Calcium: 9.6 mg/dL (ref 8.7–10.3)
Chloride: 102 mmol/L (ref 96–106)
Creatinine, Ser: 1.66 mg/dL — ABNORMAL HIGH (ref 0.57–1.00)
Glucose: 167 mg/dL — ABNORMAL HIGH (ref 70–99)
Potassium: 3.9 mmol/L (ref 3.5–5.2)
Sodium: 142 mmol/L (ref 134–144)
eGFR: 31 mL/min/{1.73_m2} — ABNORMAL LOW (ref >59)

## 2023-10-07 LAB — CUP PACEART REMOTE DEVICE CHECK
Battery Remaining Longevity: 24 mo
Battery Voltage: 2.94 V
Brady Statistic AP VP Percent: 0 %
Brady Statistic AP VS Percent: 24.28 %
Brady Statistic AS VP Percent: 0.08 %
Brady Statistic AS VS Percent: 75.64 %
Brady Statistic RA Percent Paced: 24.27 %
Brady Statistic RV Percent Paced: 0.09 %
Date Time Interrogation Session: 20250416081533
Implantable Lead Connection Status: 753985
Implantable Lead Connection Status: 753985
Implantable Lead Implant Date: 20180104
Implantable Lead Implant Date: 20180104
Implantable Lead Location: 753859
Implantable Lead Location: 753860
Implantable Lead Model: 5076
Implantable Lead Model: 5076
Implantable Pulse Generator Implant Date: 20180104
Lead Channel Impedance Value: 304 Ohm
Lead Channel Impedance Value: 342 Ohm
Lead Channel Impedance Value: 380 Ohm
Lead Channel Impedance Value: 456 Ohm
Lead Channel Pacing Threshold Amplitude: 0.625 V
Lead Channel Pacing Threshold Amplitude: 1.125 V
Lead Channel Pacing Threshold Pulse Width: 0.4 ms
Lead Channel Pacing Threshold Pulse Width: 0.4 ms
Lead Channel Sensing Intrinsic Amplitude: 1.75 mV
Lead Channel Sensing Intrinsic Amplitude: 1.75 mV
Lead Channel Sensing Intrinsic Amplitude: 14 mV
Lead Channel Sensing Intrinsic Amplitude: 14 mV
Lead Channel Setting Pacing Amplitude: 2 V
Lead Channel Setting Pacing Amplitude: 2.5 V
Lead Channel Setting Pacing Pulse Width: 0.4 ms
Lead Channel Setting Sensing Sensitivity: 2.8 mV
Zone Setting Status: 755011

## 2023-10-13 ENCOUNTER — Ambulatory Visit (HOSPITAL_COMMUNITY)
Admission: RE | Admit: 2023-10-13 | Discharge: 2023-10-13 | Disposition: A | Discharge status: Home / Self Care | Source: Ambulatory Visit | Attending: Adult Health | Admitting: Adult Health

## 2023-10-13 DIAGNOSIS — Z1231 Encounter for screening mammogram for malignant neoplasm of breast: Secondary | ICD-10-CM | POA: Insufficient documentation

## 2023-10-18 ENCOUNTER — Other Ambulatory Visit (HOSPITAL_COMMUNITY): Payer: Self-pay | Admitting: Adult Health

## 2023-10-18 DIAGNOSIS — R928 Other abnormal and inconclusive findings on diagnostic imaging of breast: Secondary | ICD-10-CM

## 2023-10-19 DIAGNOSIS — H35041 Retinal micro-aneurysms, unspecified, right eye: Secondary | ICD-10-CM | POA: Diagnosis not present

## 2023-10-19 DIAGNOSIS — H43391 Other vitreous opacities, right eye: Secondary | ICD-10-CM | POA: Diagnosis not present

## 2023-10-19 DIAGNOSIS — H353124 Nonexudative age-related macular degeneration, left eye, advanced atrophic with subfoveal involvement: Secondary | ICD-10-CM | POA: Diagnosis not present

## 2023-10-19 DIAGNOSIS — H348122 Central retinal vein occlusion, left eye, stable: Secondary | ICD-10-CM | POA: Diagnosis not present

## 2023-10-19 DIAGNOSIS — E113411 Type 2 diabetes mellitus with severe nonproliferative diabetic retinopathy with macular edema, right eye: Secondary | ICD-10-CM | POA: Diagnosis not present

## 2023-10-19 DIAGNOSIS — H34831 Tributary (branch) retinal vein occlusion, right eye, with macular edema: Secondary | ICD-10-CM | POA: Diagnosis not present

## 2023-10-19 DIAGNOSIS — H353111 Nonexudative age-related macular degeneration, right eye, early dry stage: Secondary | ICD-10-CM | POA: Diagnosis not present

## 2023-10-25 ENCOUNTER — Ambulatory Visit: Admitting: Nurse Practitioner

## 2023-10-28 ENCOUNTER — Telehealth: Payer: Self-pay | Admitting: *Deleted

## 2023-10-28 DIAGNOSIS — D7589 Other specified diseases of blood and blood-forming organs: Secondary | ICD-10-CM | POA: Diagnosis not present

## 2023-10-28 DIAGNOSIS — R809 Proteinuria, unspecified: Secondary | ICD-10-CM | POA: Diagnosis not present

## 2023-10-28 DIAGNOSIS — N2581 Secondary hyperparathyroidism of renal origin: Secondary | ICD-10-CM | POA: Diagnosis not present

## 2023-10-28 DIAGNOSIS — E211 Secondary hyperparathyroidism, not elsewhere classified: Secondary | ICD-10-CM | POA: Diagnosis not present

## 2023-10-28 DIAGNOSIS — N1832 Chronic kidney disease, stage 3b: Secondary | ICD-10-CM | POA: Diagnosis not present

## 2023-10-28 DIAGNOSIS — I4819 Other persistent atrial fibrillation: Secondary | ICD-10-CM

## 2023-10-28 DIAGNOSIS — D631 Anemia in chronic kidney disease: Secondary | ICD-10-CM | POA: Diagnosis not present

## 2023-10-28 DIAGNOSIS — E538 Deficiency of other specified B group vitamins: Secondary | ICD-10-CM | POA: Diagnosis not present

## 2023-10-28 DIAGNOSIS — D539 Nutritional anemia, unspecified: Secondary | ICD-10-CM | POA: Diagnosis not present

## 2023-10-28 MED ORDER — APIXABAN 5 MG PO TABS: 5.0000 mg | ORAL_TABLET | Freq: Two times a day (BID) | ORAL | 5 refills | Status: DC

## 2023-10-28 NOTE — Telephone Encounter (Addendum)
 Eliquis  5mg  refill request received. Patient is 78 years old, weight-59.9kg, Crea-1.66 on 10/05/23, Diagnosis-Afib, and last seen by Dr. Lawana Pray on 10/05/23. Dose is appropriate based on dosing criteria.   Crea-10/05/23-1.66, 07/27/23-1.55, 07/08/23-1.21, 04/14/23-1.31, 12/03/22-1.44 Pt pending upcoming Afib ablation on 12/22/23  Will send to PharmD to evaluate doseage.   PharmD advises to continue current dose of eliquis  5mg  bid, refill sent.

## 2023-10-28 NOTE — Telephone Encounter (Signed)
 Since her weight is so close to 60kg and she is pending an afib ablation, would leave her at 5mg 

## 2023-10-30 ENCOUNTER — Ambulatory Visit
Admission: RE | Admit: 2023-10-30 | Discharge: 2023-10-30 | Disposition: A | Discharge status: Home / Self Care | Source: Ambulatory Visit | Attending: Nurse Practitioner | Admitting: Nurse Practitioner

## 2023-10-30 VITALS — BP 153/78 | HR 82 | Temp 97.8°F | Resp 16

## 2023-10-30 DIAGNOSIS — Z87440 Personal history of urinary (tract) infections: Secondary | ICD-10-CM | POA: Insufficient documentation

## 2023-10-30 DIAGNOSIS — R399 Unspecified symptoms and signs involving the genitourinary system: Secondary | ICD-10-CM | POA: Diagnosis not present

## 2023-10-30 LAB — POCT URINALYSIS DIP (MANUAL ENTRY)
Bilirubin, UA: NEGATIVE
Glucose, UA: 100 mg/dL — AB
Ketones, POC UA: NEGATIVE mg/dL
Nitrite, UA: NEGATIVE
Protein Ur, POC: 30 mg/dL — AB
Spec Grav, UA: 1.01 (ref 1.010–1.025)
Urobilinogen, UA: 0.2 U/dL
pH, UA: 6.5 (ref 5.0–8.0)

## 2023-10-30 MED ORDER — NITROFURANTOIN MONOHYD MACRO 100 MG PO CAPS: 100.0000 mg | ORAL_CAPSULE | Freq: Two times a day (BID) | ORAL | 0 refills | Status: AC

## 2023-10-30 NOTE — ED Triage Notes (Signed)
 Burning on urination and lower abd pain since Tuesday.

## 2023-10-30 NOTE — ED Provider Notes (Signed)
 [facility]    CSN: [id number] Arrival date & time: 10/30/23  0946      History   Chief Complaint Chief Complaint  Patient presents with   Urinary Frequency    Very cloudy urine, pain when urinating and hurting in lower sides and bottom - Entered by patient    HPI SAIDIE LAYER is a 78 y.o. female.   The history is provided by the patient.   Patient presents with a 1 week history of pain with urination and lower abdominal pain.  Patient denies fever, chills, headache, hematuria, decreased urine stream, flank pain, low back pain, or vaginal symptoms.  Patient reports that her last UTI was a few months ago.  She reports that she does have a history of recurrent urinary tract infections.  Patient states that she does see urology, and is scheduled to see them this week.  Denies prior history of kidney stones or kidney infection.  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 h/o PAF/flutter during admission in 2013 for PNA. b. PAF during adm for NSTEMI 07/2015, subsequent paroxysms since then.   B12 deficiency anemia    Branch retinal vein occlusion with macular edema of right eye 10/11/2019   Chronic renal failure, stage 3b (HCC) 09/23/2017   Coronary artery disease 11/30/2014   a. remote MI. b. h/o PTCA with scoring balloon to OM1 11/2014. c. NSTEMI 03/2015 s/p DES to prox-mid Cx. d. 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 s/p LE angio 2015; followed by Dr. Alvenia Aus - managed medically.   Pericardial effusion    a. 06/2016 after ppm - s/p pericardiocentesis.   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 s/p [employer] PPM 06/2016, c/b lead perf/pericardial effusion.   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) 08/25/2023   Diastolic heart failure (HCC) 11/03/2022   Hypercoagulable state due to persistent atrial fibrillation (HCC) 09/14/2022   Bleeding at insertion site 08/15/2022   Atrial fibrillation (HCC) 08/14/2022   Angina at rest [facility]) 05/09/2022   Chronic heart failure with preserved ejection fraction (HFpEF) (HCC) 05/09/2022   Stable hemispheric branch retinal vein occlusion (BRVO) of left  eye 12/08/2021   History of vitrectomy 12/08/2021   History of colonic polyps 05/13/2021   Vitreous membranes and strands, right 01/21/2021   Advanced nonexudative age-related macular degeneration of left eye with subfoveal involvement 06/26/2020   Stable central retinal vein occlusion of left eye 05/13/2020   Early stage nonexudative age-related macular degeneration of right eye 02/27/2020   OSA on CPAP 02/12/2020   Branch  retinal vein occlusion with macular edema of right eye 10/11/2019   Right retinal defect 10/11/2019   Retinal microaneurysm of right eye 10/11/2019   CHF exacerbation (HCC) 08/25/2019   Acute exacerbation of CHF (congestive heart failure) (HCC) 08/24/2019   Acute on chronic diastolic HF (heart failure) (HCC) 12/17/2017   GERD (gastroesophageal reflux disease) 12/17/2017   Typical atrial flutter (HCC)    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   Paroxysmal atrial fibrillation (HCC) 12/08/2017   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   Chronic renal failure, stage 3b (HCC) 09/23/2017   Fever 09/23/2017   S/P pericardiocentesis 06/28/2016   Acute blood loss anemia 06/28/2016   Pericardial effusion 06/26/2016   Tachy-brady syndrome (HCC) 06/25/2016   Tamponade    Bradycardia 06/14/2016   Junctional bradycardia    Coronary artery disease involving coronary bypass graft of native heart with unstable angina pectoris (HCC)    Acute diastolic CHF (congestive heart failure), NYHA class 3 (HCC)    Systolic congestive heart failure (HCC) 05/13/2016   Multiple and bilateral precerebral artery syndromes 05/13/2016   OSA (obstructive sleep apnea) 05/13/2016   Chronic diarrhea 12/25/2015   Persistent atrial fibrillation (HCC) 09/17/2015   Chest pain 08/02/2015   Atrial fibrillation with rapid ventricular response (HCC)    Pain with urination 05/08/2015   NSTEMI (non-ST elevated myocardial infarction) (HCC) 04/02/2015   CAD in native artery 11/30/2014   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    PAD (peripheral artery disease) (HCC) 12/26/2013   Superficial fungus infection of skin 06/29/2013   UTI (urinary tract infection) 05/08/2013   Hypokalemia 03/05/2012   B12 deficiency anemia 03/02/2012   Bronchospasm 03/02/2012   Community acquired bacterial pneumonia 03/01/2012   Acute respiratory failure with  hypoxia (HCC) 03/01/2012   Type 2 diabetes with nephropathy (HCC) 02/29/2012   Hypothyroidism 02/28/2012   RLQ abdominal pain 11/24/2010   Overweight 06/03/2010   Essential hypertension 06/03/2010   Overweight 06/03/2010   Mixed hyperlipidemia 12/27/2009   Palpitations 05/17/2009   FRACTURE, TOE 12/06/2007    Past Surgical History:  Procedure Laterality Date   A-FLUTTER ABLATION N/A 12/08/2022   Procedure: A-FLUTTER ABLATION;  Surgeon: Lei Pump, MD;  Location: [facility];  Service: Cardiovascular;  Laterality: N/A;   ABDOMINAL AORTAGRAM N/A 01/03/2014   Procedure: ABDOMINAL Tommi Fraise;  Surgeon: Wenona Hamilton, MD;  Location: [facility] CATH [facility];  Service: Cardiovascular;  Laterality: N/A;   ABDOMINAL HYSTERECTOMY  06/22/1970   "partial"   APPENDECTOMY  02/20/1969   ATRIAL FIBRILLATION ABLATION N/A 08/14/2022   Procedure: ATRIAL FIBRILLATION ABLATION;  Surgeon: Lei Pump, MD;  Location: [facility];  Service: Cardiovascular;  Laterality: N/A;   ATRIAL FIBRILLATION ABLATION N/A 12/08/2022   Procedure: ATRIAL FIBRILLATION ABLATION;  Surgeon: Lei Pump, MD;  Location: [facility];  Service: Cardiovascular;  Laterality: N/A;   CARDIAC CATHETERIZATION  06/22/2006   Tiny OM-2 with 90% narrowing. Med tx.   CARDIAC CATHETERIZATION N/A 11/30/2014   Procedure: Left Heart Cath  and Coronary Angiography;  Surgeon: Millicent Ally, MD; LAD 20%, CFX 50%, OM1 95%, right PLB 30%, LV normal    CARDIAC CATHETERIZATION N/A 11/30/2014   Procedure: Coronary Balloon Angioplasty;  Surgeon: Millicent Ally, MD;  Angiosculpt scoring balloon and PTCA to the OM1 reducing stenosis from 95% to less than 10%   CARDIAC CATHETERIZATION N/A 04/03/2015   Procedure: Left Heart Cath and Coronary Angiography;  Surgeon: Mardell Shade, MD; dLAD 50%, CFX 90%, OM1 100%, PLA 15%, LVEDP 13     CARDIAC CATHETERIZATION N/A 04/03/2015   Procedure: Coronary Stent Intervention;   Surgeon: Arnoldo Lapping, MD; 3.0x18 mm Xience DES to the CFX     CARDIAC CATHETERIZATION N/A 08/02/2015   Procedure: Left Heart Cath and Coronary Angiography;  Surgeon: Millicent Ally, MD;  Location[facility] INVASIVE CV LAB;  Service: Cardiovascular;  Laterality: N/A;   CARDIAC CATHETERIZATION N/A 08/02/2015   Procedure: Coronary Stent Intervention;  Surgeon: Millicent Ally, MD;  [facility]: [facility];  Service: Cardiovascular;  Laterality: N/A;   CARDIAC CATHETERIZATION N/A 06/25/2016   Procedure: Pericardiocentesis;  Surgeon: Will Cortland Ding, MD;  Location: [facility];  Service: Cardiovascular;  Laterality: N/A;   cardiac stents     CARDIOVERSION N/A 12/15/2017   Procedure: CARDIOVERSION;  Surgeon: Alroy Aspen Lela Purple, MD;  Location: [facility] ENDOSCOPY;  Service: Cardiovascular;  Laterality: N/A;   CARDIOVERSION N/A 05/12/2022   Procedure: CARDIOVERSION;  Surgeon: Jacqueline Matsu, MD;  Location: [facility] ENDOSCOPY;  Service: Cardiovascular;  Laterality: N/A;   CARDIOVERSION N/A 09/30/2022   Procedure: CARDIOVERSION;  Surgeon: Luana Rumple, MD;  Location: [facility];  Service: Cardiovascular;  Laterality: N/A;   CARDIOVERSION N/A 10/29/2022   Procedure: CARDIOVERSION;  Surgeon: Jacqueline Matsu, MD;  Location: [facility];  Service: Cardiovascular;  Laterality: N/A;   CARDIOVERSION N/A 08/16/2023   Procedure: CARDIOVERSION;  Surgeon: Loyde Rule, MD;  [facility]: [facility];  Service: Cardiovascular;  Laterality: N/A;   CHOLECYSTECTOMY OPEN  02/20/1989   COLONOSCOPY  06/23/2003   Dr. Homero Luster: pancolonic divericula, polyp, path unknown currently   COLONOSCOPY  06/22/2010   Dr. Nolene Baumgarten: Normal TI, scattered diverticula in entire colon, small internal hemorrhoids, normal colon biopsies. Colonoscopy in 5-10 years.    COLONOSCOPY WITH PROPOFOL  N/A 06/02/2021   pancolonic diverticulosis. Two 4-6 mm polyps in transverse colon. Sessile serrated and hyperplastic. 5 year  surveillance if benefits outweigh the risks.   COLOSTOMY  05/23/1979   COLOSTOMY REVERSAL  11/21/1979   EP IMPLANTABLE DEVICE N/A 06/25/2016   Procedure: Lead Revision/Repair;  Surgeon: Will Cortland Ding, MD;  Location: [facility];  Service: Cardiovascular;  Laterality: N/A;   EP IMPLANTABLE DEVICE N/A 06/25/2016   Procedure: Pacemaker Implant;  Surgeon: Will Cortland Ding, MD;  Location: [facility];  Service: Cardiovascular;  Laterality: N/A;   EXCISIONAL HEMORRHOIDECTOMY  02/20/1969   EYE SURGERY Left 06/22/1998   "branch vein occlusion"   EYE SURGERY Left ~ 2001   "smoothed out wrinkle"   LEFT HEART CATH AND CORONARY ANGIOGRAPHY N/A 05/11/2022   Procedure: LEFT HEART CATH AND CORONARY ANGIOGRAPHY;  Surgeon: Odie Benne, MD;  Location: [facility];  Service: Cardiovascular;  Laterality: N/A;   LEFT OOPHORECTOMY  05/23/1979   nicked bowel, peritonitis, colostomy; colostomy reversed 1981    LOWER EXTREMITY ANGIOGRAM N/A 01/03/2014   Procedure: LOWER EXTREMITY ANGIOGRAM;  Surgeon: Wenona Hamilton, MD;  Location: [facility] C[facility];  Service: Cardiovascular;  Laterality: N/A;   Nuclear med stress test  10/21/2011   Small area of mild ischemia inferoapically.   PARTIAL HYSTERECTOMY  02/20/1969   left ovaries, then ovaries removed later due tumors    POLYPECTOMY  06/02/2021   Procedure: POLYPECTOMY;  Surgeon: Vinetta Greening, DO;  Location: [facility];  Service: Endoscopy;;   right eye surgery  03/2021   RIGHT OOPHORECTOMY  02/20/1969   TEE WITHOUT CARDIOVERSION N/A 05/12/2022   Procedure: TRANSESOPHAGEAL ECHOCARDIOGRAM (TEE);  Surgeon: Jacqueline Matsu, MD;  Location: [facility] ENDOSCOPY;  Service: Cardiovascular;  Laterality: N/A;   TEE WITHOUT CARDIOVERSION N/A 08/14/2022   Procedure: TRANSESOPHAGEAL ECHOCARDIOGRAM;  Surgeon: Lei Pump, MD;  Location: [facility] INVASIVE CV LAB;  Service: Cardiovascular;  Laterality: N/A;   TEE WITHOUT CARDIOVERSION N/A  12/08/2022   Procedure: TRANSESOPHAGEAL ECHOCARDIOGRAM;  Surgeon: Lei Pump, MD;  Location: [facility] INVASIVE CV LAB;  Service: Cardiovascular;  Laterality: N/A;    OB History     Gravida  3   Para  2   Term      Preterm      AB  1   Living  2      SAB  1   IAB      Ectopic      Multiple      Live Births  2            Home Medications    Prior to Admission medications   Medication Sig Start Date End Date Taking? Authorizing Provider  nitrofurantoin, macrocrystal-monohydrate, (MACROBID) 100 MG capsule Take 1 capsule (100 mg total) by mouth 2 (two) times daily for 7 days. 10/30/23 11/06/23 Yes Leath-Warren, Belen Bowers, NP  acetaminophen  (TYLENOL ) 500 MG tablet Take 1,000 mg by mouth every 8 (eight) hours as needed for headache, moderate pain or mild pain.    [provider]  ALPRAZolam  (XANAX ) 0.25 MG tablet Take 1-2 tablets (0.25-0.5 mg total) by mouth at bedtime as needed for sleep (neuropathy). 06/24/23   Dohmeier, Raoul Byes, MD  apixaban  (ELIQUIS ) 5 MG TABS tablet Take 1 tablet (5 mg total) by mouth 2 (two) times daily. 10/28/23  Yes Lasalle Pointer, NP  atorvastatin  (LIPITOR ) 80 MG tablet TAKE ONE TABLET BY MOUTH EVERY DAY AT 6:00PM 10/06/18   Flavia Hughs, MD  calcitRIOL  (ROCALTROL ) 0.25 MCG capsule Take 0.25 mcg by mouth every Monday, Wednesday, and Friday. 12/24/21   [provider]  carvedilol  (COREG ) 25 MG tablet Take 1 tablet (25 mg total) by mouth 2 (two) times daily. 10/04/23   Gerard Knight, MD  Cholecalciferol  50 MCG (2000 UT) TABS Take 2,000 Units by mouth at bedtime.    [provider]  clopidogrel  (PLAVIX ) 75 MG tablet TAKE ONE TABLET (75MG  TOTAL) BY MOUTH DAILY 03/20/19   Flavia Hughs, MD  Continuous Blood Gluc Receiver (FREESTYLE LIBRE 2 READER) DEVI 1 each by Does not apply route daily. 06/24/20   Emilie Harden, MD  Continuous Glucose Sensor (FREESTYLE LIBRE 2 SENSOR) MISC USE ONE FOR FOURTEEN DAYS 03/09/23    Emilie Harden, MD  cyanocobalamin  2000 MCG tablet Take 2,000 mcg by mouth daily.    [provider]  furosemide  (LASIX ) 40 MG tablet Take 40 mg by mouth daily as needed for fluid or edema.    [provider]  glucose blood (FREESTYLE PRECISION NEO TEST) test strip Use as instructed 05/23/21   Emilie Harden, MD  insulin  glargine (LANTUS  SOLOSTAR) 100 UNIT/ML Solostar Pen  Inject 20 Units into the skin daily. 08/06/23   Emilie Harden, MD  Insulin  Lispro-aabc (LYUMJEV  KWIKPEN) 100 UNIT/ML KwikPen Inject 4-6 units under skin before the 2 main meals of the day 08/04/23   Emilie Harden, MD  levothyroxine  (SYNTHROID ) 112 MCG tablet TAKE ONE TABLET BY MOUTH ONCE DAILY 07/10/22   Emilie Harden, MD  nitroGLYCERIN  (NITROSTAT ) 0.4 MG SL tablet Place 1 tablet (0.4 mg total) under the tongue every 5 (five) minutes x 3 doses as needed for chest pain (If not relief after 3rd dose, call 911 or go to ED). 05/18/22   Lasalle Pointer, NP  olmesartan  (BENICAR ) 5 MG tablet Take 5 mg by mouth 2 (two) times daily.    [provider]  rOPINIRole  (REQUIP ) 0.5 MG tablet TAKE ONE TABLET BY MOUTH AT BEDTIME 08/04/23   Dohmeier, Raoul Byes, MD  Semaglutide , 1 MG/DOSE, (OZEMPIC , 1 MG/DOSE,) 4 MG/3ML SOPN Inject 1 mg into the skin once a week. 02/17/23   Emilie Harden, MD    Family History Family History  Problem Relation Age of Onset   Heart disease Mother        deceased   Heart disease Father        deceased, heart disease   Diabetes Brother    Heart disease Brother    Thyroid  disease Brother    Heart disease Sister    Heart disease Brother    Thyroid  disease Brother    Lupus Daughter    Colon cancer Neg Hx     Social History Social History   Tobacco Use   Smoking status: Never    Passive exposure: Never   Smokeless tobacco: Never   Tobacco comments:    Never smoke 10/08/22  Vaping Use   Vaping status: Never Used  Substance Use Topics   Alcohol use: No     Alcohol/week: 0.0 standard drinks of alcohol   Drug use: No     Allergies   Penicillins and Percocet [oxycodone-acetaminophen ]   Review of Systems Review of Systems Per HPI  Physical Exam Triage Vital Signs ED Triage Vitals  Encounter Vitals Group     BP 10/30/23 1014 (!) 153/78     Systolic BP Percentile --      Diastolic BP Percentile --      Pulse Rate 10/30/23 1014 82     Resp 10/30/23 1014 16     Temp 10/30/23 1014 97.8 F (36.6 C)     Temp Source 10/30/23 1014 Oral     SpO2 10/30/23 1014 95 %     Weight --      Height --      Head Circumference --      Peak Flow --      Pain Score 10/30/23 1015 5     Pain Loc --      Pain Education --      Exclude from Growth Chart --    No data found.  Updated Vital Signs BP (!) 153/78 (BP Location: Right Arm)   Pulse 82   Temp 97.8 F (36.6 C) (Oral)   Resp 16   SpO2 95%   Visual Acuity Right Eye Distance:   Left Eye Distance:   Bilateral Distance:    Right Eye Near:   Left Eye Near:    Bilateral Near:     Physical Exam Vitals and nursing note reviewed.  Constitutional:      General: She is not in acute distress.    Appearance: Normal appearance.  HENT:     Head: Normocephalic.  Eyes:     Extraocular Movements: Extraocular movements intact.     Pupils: Pupils are equal, round, and reactive to light.  Cardiovascular:     Rate and Rhythm: Normal rate and regular rhythm.     Pulses: Normal pulses.     Heart sounds: Normal heart sounds.  Pulmonary:     Effort: Pulmonary effort is normal. No respiratory distress.     Breath sounds: Normal breath sounds. No stridor. No wheezing, rhonchi or rales.  Abdominal:     General: Bowel sounds are normal.     Palpations: Abdomen is soft.     Tenderness: There is abdominal tenderness. There is no right CVA tenderness or left CVA tenderness.  Musculoskeletal:     Cervical back: Normal range of motion.  Lymphadenopathy:     Cervical: No cervical adenopathy.  Skin:     General: Skin is warm and dry.  Neurological:     General: No focal deficit present.     Mental Status: She is alert and oriented to person, place, and time.  Psychiatric:        Mood and Affect: Mood normal.        Behavior: Behavior normal.      UC Treatments / Results  Labs (all labs ordered are listed, but only abnormal results are displayed) Labs Reviewed  POCT URINALYSIS DIP (MANUAL ENTRY) - Abnormal; Notable for the following components:      Result Value   Clarity, UA cloudy (*)    Glucose, UA =100 (*)    Blood, UA moderate (*)    Protein Ur, POC =30 (*)    Leukocytes, UA Small (1+) (*)    All other components within normal limits  URINE CULTURE    EKG   Radiology No results found.  Procedures Procedures (including critical care time)  Medications Ordered in UC Medications - No data to display  Initial Impression / Assessment and Plan / UC Course  I have reviewed the triage vital signs and the nursing notes.  Pertinent labs & imaging results that were available during my care of the patient were reviewed by me and considered in my medical decision making (see chart for details).  Urinalysis positive for leukocytes, protein, and blood, suspicious for urinary tract infection.  Will treat with Macrobid 100 mg twice daily for the next 7 days while urine culture is pending.  Supportive care recommendations were provided and discussed with the patient to include fluids, over-the-counter analgesics, developing a toileting schedule and avoiding caffeine.  Patient was given strict ER follow-up precautions.  Patient was in agreement with this plan of care and verbalizes understanding.  All questions were answered.  Patient stable for discharge.   Final Clinical Impressions(s) / UC Diagnoses   Final diagnoses:  UTI symptoms  History of recurrent UTIs     Discharge Instructions      A urine culture has been ordered.  You will be contacted when the results of the  culture are received.  You may be asked to either stop the antibiotic you have been prescribed today if the culture is negative, or be advised that the medication you are treated with today needs to be changed based on the results of the culture. May take over-the-counter Tylenol  as needed for pain, fever, or general discomfort. Make sure you are drinking at least 8-10 8 ounce glasses of water daily. Develop a toileting schedule that will allow you to  urinate at least every 2 hours. Avoid caffeine such as tea, soda, or coffee while symptoms persist. Go to the emergency department immediately if you experience worsening urinary symptoms with new symptoms of fever, chills, or other concerns. Follow-up with urology as scheduled. Follow-up as needed.   ED Prescriptions     Medication Sig Dispense Auth. Provider   nitrofurantoin, macrocrystal-monohydrate, (MACROBID) 100 MG capsule Take 1 capsule (100 mg total) by mouth 2 (two) times daily for 7 days. 14 capsule Leath-Warren, Belen Bowers, NP      PDMP not reviewed this encounter.   Hardy Lia, NP 10/30/23 1048

## 2023-10-30 NOTE — Discharge Instructions (Signed)
 A urine culture has been ordered.  You will be contacted when the results of the culture are received.  You may be asked to either stop the antibiotic you have been prescribed today if the culture is negative, or be advised that the medication you are treated with today needs to be changed based on the results of the culture. May take over-the-counter Tylenol  as needed for pain, fever, or general discomfort. Make sure you are drinking at least 8-10 8 ounce glasses of water daily. Develop a toileting schedule that will allow you to urinate at least every 2 hours. Avoid caffeine such as tea, soda, or coffee while symptoms persist. Go to the emergency department immediately if you experience worsening urinary symptoms with new symptoms of fever, chills, or other concerns. Follow-up with urology as scheduled. Follow-up as needed.

## 2023-11-01 LAB — URINE CULTURE: Culture: >=100000 — AB

## 2023-11-02 ENCOUNTER — Ambulatory Visit (HOSPITAL_COMMUNITY)
Admission: RE | Admit: 2023-11-02 | Discharge: 2023-11-02 | Disposition: A | Discharge status: Home / Self Care | Source: Ambulatory Visit | Attending: Adult Health | Admitting: Adult Health

## 2023-11-02 ENCOUNTER — Encounter (HOSPITAL_COMMUNITY): Payer: Self-pay

## 2023-11-02 DIAGNOSIS — R92321 Mammographic fibroglandular density, right breast: Secondary | ICD-10-CM | POA: Diagnosis not present

## 2023-11-02 DIAGNOSIS — R928 Other abnormal and inconclusive findings on diagnostic imaging of breast: Secondary | ICD-10-CM

## 2023-11-03 ENCOUNTER — Ambulatory Visit: Payer: Self-pay | Admitting: Adult Health

## 2023-11-04 DIAGNOSIS — E1129 Type 2 diabetes mellitus with other diabetic kidney complication: Secondary | ICD-10-CM | POA: Diagnosis not present

## 2023-11-04 DIAGNOSIS — N2581 Secondary hyperparathyroidism of renal origin: Secondary | ICD-10-CM | POA: Diagnosis not present

## 2023-11-04 DIAGNOSIS — N1832 Chronic kidney disease, stage 3b: Secondary | ICD-10-CM | POA: Diagnosis not present

## 2023-11-04 DIAGNOSIS — E1122 Type 2 diabetes mellitus with diabetic chronic kidney disease: Secondary | ICD-10-CM | POA: Diagnosis not present

## 2023-11-10 ENCOUNTER — Other Ambulatory Visit: Payer: Self-pay

## 2023-11-10 ENCOUNTER — Telehealth: Payer: Self-pay

## 2023-11-10 MED ORDER — ROPINIROLE HCL 0.5 MG PO TABS: 0.5000 mg | ORAL_TABLET | Freq: Every day | ORAL | 2 refills | Status: DC

## 2023-11-10 NOTE — Telephone Encounter (Signed)
 Patient aware and verbalized understanding.

## 2023-11-10 NOTE — Telephone Encounter (Signed)
 Patient states that her blood sugars have been up and down. She currently takes Lantus  20 units and Lyumjev  4 units before breakfast and dinner. Patient provided some readings as she was having trouble going back on the meter.   5/21  7am-91-fasting  5/20  7am-89-fasting  2pm-135  3pm-47  8pm- 67  1130pm-181   5/19  7am- 68-fasting   1030pm- 230

## 2023-11-17 NOTE — Addendum Note (Signed)
 Addended by: Lott Rouleau A on: 11/17/2023 02:48 PM   Modules accepted: Orders

## 2023-11-17 NOTE — Progress Notes (Signed)
 Remote pacemaker transmission.

## 2023-11-18 ENCOUNTER — Encounter: Payer: Self-pay | Admitting: *Deleted

## 2023-11-20 DIAGNOSIS — E1122 Type 2 diabetes mellitus with diabetic chronic kidney disease: Secondary | ICD-10-CM | POA: Diagnosis not present

## 2023-11-20 DIAGNOSIS — N184 Chronic kidney disease, stage 4 (severe): Secondary | ICD-10-CM | POA: Diagnosis not present

## 2023-11-22 ENCOUNTER — Encounter: Payer: Self-pay | Admitting: Podiatry

## 2023-11-22 ENCOUNTER — Ambulatory Visit (INDEPENDENT_AMBULATORY_CARE_PROVIDER_SITE_OTHER): Admitting: Podiatry

## 2023-11-22 DIAGNOSIS — M79674 Pain in right toe(s): Secondary | ICD-10-CM | POA: Diagnosis not present

## 2023-11-22 DIAGNOSIS — M79675 Pain in left toe(s): Secondary | ICD-10-CM

## 2023-11-22 DIAGNOSIS — B351 Tinea unguium: Secondary | ICD-10-CM

## 2023-11-22 DIAGNOSIS — E119 Type 2 diabetes mellitus without complications: Secondary | ICD-10-CM

## 2023-11-22 NOTE — Progress Notes (Signed)
 This patient returns to my office for at risk foot care.  This patient requires this care by a professional since this patient will be at risk due to having type 2 dm with neuropathy. This patient is unable to cut nails herself since the patient cannot reach her nails.These nails are painful walking and wearing shoes.  This patient presents for at risk foot care today.  General Appearance  Alert, conversant and in no acute stress.  Vascular  Dorsalis pedis and posterior tibial  pulses are palpable  bilaterally.  Capillary return is within normal limits  bilaterally. Temperature is within normal limits  bilaterally.  Neurologic  Senn-Weinstein monofilament wire test within normal limits  bilaterally. Muscle power within normal limits bilaterally.  Nails Thick disfigured discolored nails with subungual debris  from hallux to fifth toes bilaterally. No evidence of bacterial infection or drainage bilaterally.  Orthopedic  No limitations of motion  feet .  No crepitus or effusions noted.  HAV 1st MPJ right.  Hammer toe second right.  Skin  normotropic skin with no porokeratosis noted bilaterally.  No signs of infections or ulcers noted.     Onychomycosis  Pain in right toes  Pain in left toes  Consent was obtained for treatment procedures.   Mechanical debridement of nails 1-5  bilaterally performed with a nail nipper.  Filed with dremel without incident.    Return office visit    3 months                  Told patient to return for periodic foot care and evaluation due to potential at risk complications.   Ruffin Cotton DPM

## 2023-11-23 DIAGNOSIS — H35041 Retinal micro-aneurysms, unspecified, right eye: Secondary | ICD-10-CM | POA: Diagnosis not present

## 2023-11-23 DIAGNOSIS — H348122 Central retinal vein occlusion, left eye, stable: Secondary | ICD-10-CM | POA: Diagnosis not present

## 2023-11-23 DIAGNOSIS — H353124 Nonexudative age-related macular degeneration, left eye, advanced atrophic with subfoveal involvement: Secondary | ICD-10-CM | POA: Diagnosis not present

## 2023-11-23 DIAGNOSIS — H34831 Tributary (branch) retinal vein occlusion, right eye, with macular edema: Secondary | ICD-10-CM | POA: Diagnosis not present

## 2023-11-23 DIAGNOSIS — H353111 Nonexudative age-related macular degeneration, right eye, early dry stage: Secondary | ICD-10-CM | POA: Diagnosis not present

## 2023-11-23 DIAGNOSIS — E113411 Type 2 diabetes mellitus with severe nonproliferative diabetic retinopathy with macular edema, right eye: Secondary | ICD-10-CM | POA: Diagnosis not present

## 2023-11-23 DIAGNOSIS — H43391 Other vitreous opacities, right eye: Secondary | ICD-10-CM | POA: Diagnosis not present

## 2023-11-25 DIAGNOSIS — Z01812 Encounter for preprocedural laboratory examination: Secondary | ICD-10-CM | POA: Diagnosis not present

## 2023-11-25 DIAGNOSIS — Z79899 Other long term (current) drug therapy: Secondary | ICD-10-CM | POA: Diagnosis not present

## 2023-11-25 DIAGNOSIS — I4819 Other persistent atrial fibrillation: Secondary | ICD-10-CM | POA: Diagnosis not present

## 2023-11-25 LAB — CBC
Hematocrit: 40.1 % (ref 34.0–46.6)
Hemoglobin: 12.8 g/dL (ref 11.1–15.9)
MCH: 30 pg (ref 26.6–33.0)
MCHC: 31.9 g/dL (ref 31.5–35.7)
MCV: 94 fL (ref 79–97)
Platelets: 209 10*3/uL (ref 150–450)
RBC: 4.27 x10E6/uL (ref 3.77–5.28)
RDW: 15.2 % (ref 11.7–15.4)
WBC: 7.5 10*3/uL (ref 3.4–10.8)

## 2023-11-26 LAB — BASIC METABOLIC PANEL WITH GFR
BUN/Creatinine Ratio: 14 (ref 12–28)
BUN: 19 mg/dL (ref 8–27)
CO2: 22 mmol/L (ref 20–29)
Calcium: 9.1 mg/dL (ref 8.7–10.3)
Chloride: 106 mmol/L (ref 96–106)
Creatinine, Ser: 1.35 mg/dL — ABNORMAL HIGH (ref 0.57–1.00)
Glucose: 170 mg/dL — ABNORMAL HIGH (ref 70–99)
Potassium: 4.6 mmol/L (ref 3.5–5.2)
Sodium: 142 mmol/L (ref 134–144)
eGFR: 40 mL/min/{1.73_m2} — ABNORMAL LOW (ref >59)

## 2023-11-29 ENCOUNTER — Encounter (HOSPITAL_COMMUNITY): Payer: Self-pay

## 2023-12-01 ENCOUNTER — Ambulatory Visit (HOSPITAL_COMMUNITY)
Admission: RE | Admit: 2023-12-01 | Discharge: 2023-12-01 | Disposition: A | Discharge status: Home / Self Care | Source: Ambulatory Visit | Attending: Cardiovascular Disease | Admitting: Cardiovascular Disease

## 2023-12-01 DIAGNOSIS — Z01810 Encounter for preprocedural cardiovascular examination: Secondary | ICD-10-CM | POA: Diagnosis not present

## 2023-12-01 DIAGNOSIS — I4819 Other persistent atrial fibrillation: Secondary | ICD-10-CM | POA: Insufficient documentation

## 2023-12-01 DIAGNOSIS — Z01812 Encounter for preprocedural laboratory examination: Secondary | ICD-10-CM

## 2023-12-01 MED ORDER — IOHEXOL 350 MG/ML SOLN
100.0000 mL | Freq: Once | INTRAVENOUS | Status: AC | PRN
  Administered 2023-12-01: 100 mL via INTRAVENOUS

## 2023-12-03 ENCOUNTER — Encounter: Payer: Self-pay | Admitting: Internal Medicine

## 2023-12-03 ENCOUNTER — Ambulatory Visit (INDEPENDENT_AMBULATORY_CARE_PROVIDER_SITE_OTHER): Payer: PPO | Admitting: Internal Medicine

## 2023-12-03 VITALS — BP 116/68 | HR 84 | Ht 63.0 in | Wt 134.0 lb

## 2023-12-03 DIAGNOSIS — Z7985 Long-term (current) use of injectable non-insulin antidiabetic drugs: Secondary | ICD-10-CM

## 2023-12-03 DIAGNOSIS — E1159 Type 2 diabetes mellitus with other circulatory complications: Secondary | ICD-10-CM | POA: Diagnosis not present

## 2023-12-03 DIAGNOSIS — Z794 Long term (current) use of insulin: Secondary | ICD-10-CM

## 2023-12-03 DIAGNOSIS — E1165 Type 2 diabetes mellitus with hyperglycemia: Secondary | ICD-10-CM

## 2023-12-03 DIAGNOSIS — E039 Hypothyroidism, unspecified: Secondary | ICD-10-CM

## 2023-12-03 DIAGNOSIS — E785 Hyperlipidemia, unspecified: Secondary | ICD-10-CM | POA: Diagnosis not present

## 2023-12-03 LAB — POCT GLYCOSYLATED HEMOGLOBIN (HGB A1C): Hemoglobin A1C: 7.1 % — AB (ref 4.0–5.6)

## 2023-12-03 MED ORDER — LEVOTHYROXINE SODIUM 112 MCG PO TABS: ORAL_TABLET | ORAL | 3 refills | Status: AC

## 2023-12-03 NOTE — Addendum Note (Signed)
 Addended by: Vernon Goodpasture on: 12/03/2023 04:14 PM   Modules accepted: Orders

## 2023-12-03 NOTE — Progress Notes (Signed)
 Patient ID: Virginia Huber, female   DOB: 1946/06/02, 78 y.o.   MRN: [id number]   HPI: Virginia Huber is a 78 y.o.-year-old female, initially referred by her PCP, Dr.Golding, returning for follow-up for DM2, dx in ~2005, insulin -dependent since 2006, uncontrolled, with complications (CAD, s/p NSTEMI, s/p PTCA and DES; CHF; PAF/flutter; PAD; CKD; cerebrovascular disease - s/p TIA; PN).  She previously saw my colleague, Dr. Washington Hacker.  Our last visit was 6 months ago.  Interim history: She continues to have floaters -getting intraocular injections. No increased urination, nausea, chest pain.  At last visit, she was preparing for a cardioversion and was off Ozempic .  She restarted since.  She is now preparing for an ablation next month (her third) >> will be off Ozempic  again for this.   DM2: Reviewed HbA1c levels: Lab Results  Component Value Date   HGBA1C 7.5 (A) 08/04/2023   HGBA1C 6.4 (A) 02/01/2023   HGBA1C 5.8 (A) 07/15/2022   HGBA1C 6.4 (H) 05/10/2022   HGBA1C 6.0 (A) 02/09/2022   HGBA1C 6.7 (A) 10/09/2021   HGBA1C 7.5 (A) 06/06/2021   HGBA1C 7.7 (A) 02/04/2021   HGBA1C 7.4 (A) 10/22/2020   HGBA1C 6.7 (A) 07/23/2020   At last visit she was on: - NPH/R 70/30 20-22 >> 28 units in am and 28-30 before dinner >> ...>> NPH 15 units in am and 8-10  before dinner >> 5 units at bedtime - Trulicity  3 mg weekly >> Ozempic  2 mg weekly -started 05/2021 >> 1 mg weekly (off x 2 weeks). No FH of MTC or personal h/o pancreatitis. She was on Metformin  >> stopped b/c CKD.  I recommended the following regimen: - Lantus  20 units in am - Lyumjev  4-6 >> 4 units before brunch and dinner - Ozempic  1 mg in am But with prospect of eliminating Lyumjev  after restarting Ozempic  1 mg weekly.  Pt checks her sugars 4x a day with the Libre CGM:  Previously:  Previously:  Lowest sugar was 55 >> 60s >> 60s >> 60 >> 54; she has hypoglycemia awareness in the 70s.  Highest sugar was 200s >> 319 >>  268  Glucometer: Freestyle lite  Patient starting to improve her meals after her last HbA1c returned: - Brunch: scrambled egg + bacon + rye bread or cheerios + skim milk - Dinner: meat - grilled + veggies - Snacks: seldom - apple Drinks occasional diet drinks  -+ CKD-sees nephrology: Dr. Carrolyn Clan, last BUN/creatinine:  Lab Results  Component Value Date   BUN 19 11/25/2023   BUN 28 (H) 10/05/2023   CREATININE 1.35 (H) 11/25/2023   CREATININE 1.66 (H) 10/05/2023  On Benicar  40.  -+ HL; last set of lipids: 08/05/2023: 135/97/41/76 Lab Results  Component Value Date   CHOL 96 05/10/2022   HDL 30 (L) 05/10/2022   LDLCALC 35 05/10/2022   TRIG 156 (H) 05/10/2022   CHOLHDL 3.2 05/10/2022  On Lipitor  80.  - last eye exam was on 09/20/2023: Severe NPDR OD, macular edema. Dr Seward Dao.  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 She lost vision in L eye.  She has a history of BRAO.  She had eye surgery 03/2021 >> vision is better.  - she has numbness and tingling in her feet.  Latest foot exam was performed by podiatry-11/25/2023 - Dr. Zettie Hillock  Pt has FH of DM in M and her family.  Hypothyroidism:  Pt is on levothyroxine   112 mcg daily (dose decreased to 08/2021), taken: - in am -  fasting - at least 30 min from b'fast - + calcium  in the morning - no iron - no multivitamins - no PPIs - not on Biotin  TSH levels were reviewed: 08/05/2023: TSH 4.510 (0.45-4.5) Lab Results  Component Value Date   TSH 1.170 03/23/2023   TSH 2.100 09/25/2022   TSH 0.571 03/17/2022   TSH 1.18 10/09/2021   TSH 0.183 (L) 08/25/2021   TSH 0.40 02/04/2021   TSH 0.22 (L) 10/22/2020   TSH 0.233 (L) 08/22/2020   TSH 2.410 02/22/2020   TSH 0.424 (L) 08/15/2019   She also has a history of heart block, tachybradycardia syndrome, pericardial effusion (history of tamponade). She has Afib  - was hospitalized in 04/2022 for cardioversion.  She had a cardiac ablation in 07/2022, then cardioversion in  09/2022, and then again an ablation in 11/2022.  ROS: + see HPI  I reviewed pt's medications, allergies, PMH, social hx, family hx, and changes were documented in the history of present illness. Otherwise, unchanged from my initial visit note.  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 h/o PAF/flutter during admission in 2013 for PNA. b. PAF during adm for NSTEMI 07/2015, subsequent paroxysms since then.   B12 deficiency anemia    Branch retinal vein occlusion with macular edema of right eye 10/11/2019   Chronic renal failure, stage 3b (HCC) 09/23/2017   Coronary artery disease 11/30/2014   a. remote MI. b. h/o PTCA with scoring balloon to OM1 11/2014. c. NSTEMI 03/2015 s/p DES to prox-mid Cx. d. NSTEMI 07/2015 s/p scoring balloon/PTCA/DES to dRCA with PAF during that admission   Cutaneous lupus erythematosus    Early stage nonexudative age-related macular degeneration of right eye 02/27/2020   Essential hypertension    GERD (gastroesophageal reflux disease)    History of blood transfusion 1980's   2nd surgical procedures   Hypercholesteremia    Hypothyroidism    Myocardial infarction (HCC) 02/2012   NSTEMI (non-ST elevated myocardial infarction) (HCC) 04/02/2015   OSA (obstructive sleep apnea) 05/13/2016   Ovarian tumor    PAD (peripheral artery disease) (HCC)    a. s/p LE angio 2015; followed by Dr. Alvenia Aus - managed medically.   Pericardial effusion    a. 06/2016 after ppm - s/p pericardiocentesis.   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 s/p Medtronic PPM 06/2016, c/b lead perf/pericardial effusion.   TIA (transient ischemic attack)    Type 2 diabetes with nephropathy (HCC) 02/29/2012   Past Surgical History:  Procedure Laterality Date   A-FLUTTER ABLATION N/A 12/08/2022   Procedure: A-FLUTTER ABLATION;  Surgeon: Lei Pump, MD;  Location: [facility];  Service: Cardiovascular;  Laterality: N/A;   ABDOMINAL AORTAGRAM N/A 01/03/2014   Procedure: ABDOMINAL Tommi Fraise;  Surgeon: Wenona Hamilton, MD;  Location: [facility] CATH [facility];  Service: Cardiovascular;  Laterality: N/A;  ABDOMINAL HYSTERECTOMY  06/22/1970   partial   APPENDECTOMY  02/20/1969   ATRIAL FIBRILLATION ABLATION N/A 08/14/2022   Procedure: ATRIAL FIBRILLATION ABLATION;  Surgeon: Lei Pump, MD;  Location: [facility];  Service: Cardiovascular;  Laterality: N/A;   ATRIAL FIBRILLATION ABLATION N/A 12/08/2022   Procedure: ATRIAL FIBRILLATION ABLATION;  Surgeon: Lei Pump, MD;  Location: [facility];  Service: Cardiovascular;  Laterality: N/A;   CARDIAC CATHETERIZATION  06/22/2006   Tiny OM-2 with 90% narrowing. Med tx.   CARDIAC CATHETERIZATION N/A 11/30/2014   Procedure: Left Heart Cath and Coronary Angiography;  Surgeon: Millicent Ally, MD; LAD 20%, CFX 50%, OM1 95%, right PLB 30%, LV normal    CARDIAC CATHETERIZATION N/A 11/30/2014   Procedure: Coronary Balloon Angioplasty;  Surgeon: Millicent Ally, MD;  Angiosculpt scoring balloon and PTCA to the OM1 reducing stenosis from 95% to less than 10%   CARDIAC CATHETERIZATION N/A 04/03/2015   Procedure: Left Heart Cath and Coronary Angiography;  Surgeon: Mardell Shade, MD; dLAD 50%, CFX 90%, OM1 100%, PLA 15%, LVEDP 13     CARDIAC CATHETERIZATION  N/A 04/03/2015   Procedure: Coronary Stent Intervention;  Surgeon: Arnoldo Lapping, MD; 3.0x18 mm Xience DES to the CFX     CARDIAC CATHETERIZATION N/A 08/02/2015   Procedure: Left Heart Cath and Coronary Angiography;  Surgeon: Millicent Ally, MD;  Location: [facility] INVASIVE CV LAB;  Service: Cardiovascular;  Laterality: N/A;   CARDIAC CATHETERIZATION N/A 08/02/2015   Procedure: Coronary Stent Intervention;  Surgeon: Millicent Ally, MD;  [facility];  Service: Cardiovascular;  Laterality: N/A;   CARDIAC CATHETERIZATION N/A 06/25/2016   Procedure: Pericardiocentesis;  Surgeon: Will Cortland Ding, MD;  [facility]: [facility];  Service: Cardiovascular;  Laterality: N/A;   cardiac stents     CARDIOVERSION N/A 12/15/2017   Procedure: CARDIOVERSION;  Surgeon: Alroy Aspen Lela Purple, MD;  Location: [facility] ENDOSCOPY;  Service: Cardiovascular;  Laterality: N/A;   CARDIOVERSION N/A 05/12/2022   Procedure: CARDIOVERSION;  Surgeon: Jacqueline Matsu, MD;  Location: [facility] ENDOSCOPY;  Service: Cardiovascular;  Laterality: N/A;   CARDIOVERSION N/A 09/30/2022   Procedure: CARDIOVERSION;  Surgeon: Luana Rumple, MD;  [facility]: [facility];  Service: Cardiovascular;  Laterality: N/A;   CARDIOVERSION N/A 10/29/2022   Procedure: CARDIOVERSION;  Surgeon: Jacqueline Matsu, MD;  [facility];  Service: Cardiovascular;  Laterality: N/A;   CARDIOVERSION N/A 08/16/2023   Procedure: CARDIOVERSION;  Surgeon: Loyde Rule, MD;  [facility]: [facility];  Service: Cardiovascular;  Laterality: N/A;   CHOLECYSTECTOMY OPEN  02/20/1989   COLONOSCOPY  06/23/2003   Dr. Homero Luster: pancolonic divericula, polyp, path unknown currently   COLONOSCOPY  06/22/2010   Dr. Nolene Baumgarten: Normal TI, scattered diverticula in entire colon, small internal hemorrhoids, normal colon biopsies. Colonoscopy in 5-10 years.    COLONOSCOPY WITH PROPOFOL  N/A 06/02/2021   pancolonic diverticulosis. Two 4-6 mm polyps in transverse  colon. Sessile serrated and hyperplastic. 5 year surveillance if benefits outweigh the risks.   COLOSTOMY  05/23/1979   COLOSTOMY REVERSAL  11/21/1979   EP IMPLANTABLE DEVICE N/A 06/25/2016   Procedure: Lead Revision/Repair;  Surgeon: Will Cortland Ding, MD;  [facility];  Service: Cardiovascular;  Laterality: N/A;   EP IMPLANTABLE DEVICE N/A 06/25/2016   Procedure: Pacemaker Implant;  Surgeon: Will Cortland Ding, MD;  [facility]: [facility];  Service: Cardiovascular;  Laterality: N/A;   EXCISIONAL HEMORRHOIDECTOMY  02/20/1969  EYE SURGERY Left 06/22/1998   branch vein occlusion   EYE SURGERY Left ~ 2001   smoothed out wrinkle   LEFT HEART CATH AND CORONARY ANGIOGRAPHY N/A 05/11/2022   Procedure: LEFT HEART CATH AND CORONARY ANGIOGRAPHY;  Surgeon: Odie Benne, MD;  [facility]: [facility];  Service: Cardiovascular;  Laterality: N/A;   LEFT OOPHORECTOMY  05/23/1979   nicked bowel, peritonitis, colostomy; colostomy reversed 1981    LOWER EXTREMITY ANGIOGRAM N/A 01/03/2014   Procedure: LOWER EXTREMITY ANGIOGRAM;  Surgeon: Wenona Hamilton, MD;  Location: [facility] CATH [facility];  Service: Cardiovascular;  Laterality: N/A;   Nuclear med stress test  10/21/2011   Small area of mild ischemia inferoapically.   PARTIAL HYSTERECTOMY  02/20/1969   left ovaries, then ovaries removed later due tumors    POLYPECTOMY  06/02/2021   Procedure: POLYPECTOMY;  Surgeon: Vinetta Greening, DO;  Location: [facility];  Service: Endoscopy;;   right eye surgery  03/2021   RIGHT OOPHORECTOMY  02/20/1969   TEE WITHOUT CARDIOVERSION N/A 05/12/2022   Procedure: TRANSESOPHAGEAL ECHOCARDIOGRAM (TEE);  Surgeon: Jacqueline Matsu, MD;  Location: [facility] ENDOSCOPY;  Service: Cardiovascular;  Laterality: N/A;   TEE WITHOUT CARDIOVERSION N/A 08/14/2022   Procedure: TRANSESOPHAGEAL ECHOCARDIOGRAM;  Surgeon: Lei Pump, MD;  Location: [facility] INVASIVE CV LAB;  Service: Cardiovascular;   Laterality: N/A;   TEE WITHOUT CARDIOVERSION N/A 12/08/2022   Procedure: TRANSESOPHAGEAL ECHOCARDIOGRAM;  Surgeon: Lei Pump, MD;  Location: [facility] INVASIVE CV LAB;  Service: Cardiovascular;  Laterality: N/A;   Social History   Socioeconomic History   Marital status: Married    Spouse name: Not on file   Number of children: Not on file   Years of education: Not on file   Highest education level: Not on file  Occupational History   Occupation: Retired    Associate Professor: RETIRED    Comment: insurance billing  Tobacco Use   Smoking status: Never    Passive exposure: Never   Smokeless tobacco: Never   Tobacco comments:    Never smoke 10/08/22  Vaping Use   Vaping status: Never Used  Substance and Sexual Activity   Alcohol use: No    Alcohol/week: 0.0 standard drinks of alcohol   Drug use: No   Sexual activity: Never    Birth control/protection: Surgical    Comment: hyst  Other Topics Concern   Not on file  Social History Narrative   Not on file   Social Drivers of Health   Financial Resource Strain: Not on file  Food Insecurity: No Food Insecurity (04/13/2023)   Hunger Vital Sign    Worried About Running Out of Food in the Last Year: Never true    Ran Out of Food in the Last Year: Never true  Transportation Needs: No Transportation Needs (04/13/2023)   PRAPARE - Administrator, Civil Service (Medical): No    Lack of Transportation (Non-Medical): No  Physical Activity: Not on file  Stress: Not on file  Social Connections: Not on file  Intimate Partner Violence: Not At Risk (04/13/2023)   Humiliation, Afraid, Rape, and Kick questionnaire    Fear of Current or Ex-Partner: No    Emotionally Abused: No    Physically Abused: No    Sexually Abused: No   Current Outpatient Medications on File Prior to Visit  Medication Sig Dispense Refill   acetaminophen  (TYLENOL ) 500 MG tablet Take 1,000 mg by mouth every 8 (eight) hours as needed for headache, moderate  pain  or mild pain.     ALPRAZolam  (XANAX ) 0.25 MG tablet Take 1-2 tablets (0.25-0.5 mg total) by mouth at bedtime as needed for sleep (neuropathy). 60 tablet 0   apixaban  (ELIQUIS ) 5 MG TABS tablet Take 1 tablet (5 mg total) by mouth 2 (two) times daily. 60 tablet 5   atorvastatin  (LIPITOR ) 80 MG tablet TAKE ONE TABLET BY MOUTH EVERY DAY AT 6:00PM 90 tablet 3   calcitRIOL  (ROCALTROL ) 0.25 MCG capsule Take 0.25 mcg by mouth every Monday, Wednesday, and Friday.     carvedilol  (COREG ) 25 MG tablet Take 1 tablet (25 mg total) by mouth 2 (two) times daily. 60 tablet 3   Cholecalciferol  50 MCG (2000 UT) TABS Take 2,000 Units by mouth at bedtime.     clopidogrel  (PLAVIX ) 75 MG tablet TAKE ONE TABLET (75MG  TOTAL) BY MOUTH DAILY 90 tablet 3   Continuous Blood Gluc Receiver (FREESTYLE LIBRE 2 READER) DEVI 1 each by Does not apply route daily. 1 each 0   Continuous Glucose Sensor (FREESTYLE LIBRE 2 SENSOR) MISC USE ONE FOR FOURTEEN DAYS 6 each 3   cyanocobalamin  2000 MCG tablet Take 2,000 mcg by mouth daily.     furosemide  (LASIX ) 40 MG tablet Take 40 mg by mouth daily as needed for fluid or edema.     glucose blood (FREESTYLE PRECISION NEO TEST) test strip Use as instructed 100 each 3   insulin  glargine (LANTUS  SOLOSTAR) 100 UNIT/ML Solostar Pen Inject 20 Units into the skin daily. 15 mL 2   Insulin  Lispro-aabc (LYUMJEV  KWIKPEN) 100 UNIT/ML KwikPen Inject 4-6 units under skin before the 2 main meals of the day 15 mL 3   levothyroxine  (SYNTHROID ) 112 MCG tablet TAKE ONE TABLET BY MOUTH ONCE DAILY 90 tablet 2   nitroGLYCERIN  (NITROSTAT ) 0.4 MG SL tablet Place 1 tablet (0.4 mg total) under the tongue every 5 (five) minutes x 3 doses as needed for chest pain (If not relief after 3rd dose, call 911 or go to ED). 25 tablet 0   olmesartan  (BENICAR ) 5 MG tablet Take 5 mg by mouth 2 (two) times daily.     rOPINIRole  (REQUIP ) 0.5 MG tablet Take 1 tablet (0.5 mg total) by mouth at bedtime. 30 tablet 2   Semaglutide , 1  MG/DOSE, (OZEMPIC , 1 MG/DOSE,) 4 MG/3ML SOPN Inject 1 mg into the skin once a week. 3 mL 2   No current facility-administered medications on file prior to visit.   Allergies  Allergen Reactions   Penicillins Hives   Percocet [Oxycodone-Acetaminophen ] Nausea And Vomiting   Family History  Problem Relation Age of Onset   Heart disease Mother        deceased   Heart disease Father        deceased, heart disease   Diabetes Brother    Heart disease Brother    Thyroid  disease Brother    Heart disease Sister    Heart disease Brother    Thyroid  disease Brother    Lupus Daughter    Colon cancer Neg Hx    PE: BP 116/68   Pulse 84   Ht 5' 3 (1.6 m)   Wt 134 lb (60.8 kg)   SpO2 94%   BMI 23.74 kg/m  Wt Readings from Last 3 Encounters:  12/03/23 134 lb (60.8 kg)  10/05/23 132 lb (59.9 kg)  09/09/23 133 lb (60.3 kg)   Constitutional: normal weight, in NAD Eyes: no exophthalmos ENT: no masses palpated in neck, no cervical lymphadenopathy Cardiovascular: Irregularly  irregular rhythm, RR, No MRG Respiratory: CTA B Musculoskeletal: no deformities Skin: no rashes Neurological: + tremor with outstretched hands - L>R  ASSESSMENT: 1. DM2, insulin -dependent, now more controlled, withcomplications - CAD, s/p NSTEMI 07/2015 and 03/2015, s/p PTCA and DES - CHF - PAF/flutter - PAD - cerebrovascular disease - s/p TIA - CKD  2. HL  3.  Hypothyroidism  PLAN:  1. Patient with longstanding, previously uncontrolled type 2 diabetes, with improved control on a weekly GLP-1 receptor agonist and insulin , but off the GLP-1 receptor agonist at last visit before her cardioversion..  At last visit we changed from premixed insulin  regimen to a basal-bolus insulin  regimen.  Sugars were higher at that time, up to 300s.  We did discuss that after she restarted Ozempic , she may not need the ultra rapid acting insulin .  HbA1c was higher, at 7.5% 4 months ago. -we previously discussed about adding an  SGLT2 inhibitor in the past but I advised her to check with her nephrologist first.  She saw Dr. Carrolyn Clan in 04/2020 and he wanted us  to hold off using an SGLT2 inhibitor as her kidney function was trending down.  CGM interpretation: -At today's visit, we reviewed her CGM downloads: It appears that 81% of values are in target range (goal >70%), while 18% are higher than 180 (goal <25%), and 1% are lower than 70 (goal <4%).  The calculated average blood sugar is 143.  The projected HbA1c for the next 3 months (GMI) is 6.7%. -Reviewing the CGM trends, sugars have improved since last visit, and they fluctuate mostly within the target range, however, she does have occasional significant hyperglycemic exceptions after her brunch and after her dinner.  She is using a fixed dose of Lyumjev , 4 units before these meals and we discussed that for larger meals, she may need a higher dose, 6 units, for example.  Otherwise we can continue the current regimen.  She will be off her Ozempic  for 2 weeks before her ablation, but we will continue the Lyumjev  and Lantus  at that time. - I suggested to:  Patient Instructions  Please continue: - Lantus  20 units in am - Ozempic  1 mg weekly   Increase: - Lyumjev  4-6 units before brunch and dinner  Continue Levothyroxine  112 mcg daily.  Take the thyroid  hormone every day, with water, at least 30 minutes before breakfast, separated by at least 4 hours from: - acid reflux medications - calcium  - iron - multivitamins  Please return in 3-4 months.  - we checked her HbA1c: 7.1% (improved) - advised to check sugars at different times of the day - 4x a day, rotating check times - advised for yearly eye exams >> she is UTD - return to clinic in 3-4 months  2. HL - Latest lipid panel showed an LDL above her goal of less than 55, the rest of the fractions at goal: 08/05/2023: 135/97/41/76 - She continues Lipitor  80 mg daily without side effects  3.  Hypothyroidism -  Latest TSH level reviewed from 08/05/2023: 4.510 (0.45-4.5), higher than before - she continues on LT4 112 mcg daily - pt feels good on this dose. - we discussed about taking the thyroid  hormone every day, with water, >30 minutes before breakfast, separated by >4 hours from acid reflux medications, calcium , iron, multivitamins. Pt. is not taking it correctly.  She started taking calcium  in the morning.  We discussed about moving this at least 4 hours away.  Will recheck her TFTs at next visit.  I refilled her LT4 prescription.  Emilie Harden, MD PhD [facility] Endocr[facility]

## 2023-12-03 NOTE — Patient Instructions (Addendum)
 Please continue: - Lantus  20 units in am - Ozempic  1 mg weekly   Increase: - Lyumjev  4-6 units before brunch and dinner  Continue Levothyroxine  112 mcg daily.  Take the thyroid  hormone every day, with water, at least 30 minutes before breakfast, separated by at least 4 hours from: - acid reflux medications - calcium  - iron - multivitamins  Please return in 3-4 months.

## 2023-12-06 ENCOUNTER — Telehealth: Payer: Self-pay

## 2023-12-06 NOTE — Telephone Encounter (Signed)
 Spoke with patient to complete pre-procedure call.     New medical conditions?  NO Recent hospitalizations or surgeries? NO Started any new medications? NO Patient made aware to contact office to inform of any new medications started. Any changes in activities of daily living?  NO  Pre-procedure testing scheduled: CT was completed on  12/01/23 and lab work was completed on 11/25/23.  Confirmed patient is taking Plavix  75mg  daily and will continue taking medication before procedure or it may need to be rescheduled.  Confirmed patient is scheduled for Atrial Fibrillation Ablation on Wednesday, July 2 with Dr. Agatha Horsfall. Instructed patient to arrive at the Main Entrance A at [facility]: [address] [place], [place] [address] and check in at Admitting at 6:30 AM  Advised of plan to go home the same day and will only stay overnight if medically necessary. You MUST have a responsible adult to drive you home and MUST be with you the first 24 hours after you arrive home or your procedure could be cancelled.  (Patient confirmed husband will be with her)   Patient verbalized understanding to information provided and is agreeable to proceed with procedure.

## 2023-12-09 ENCOUNTER — Encounter: Payer: Self-pay | Admitting: Cardiology

## 2023-12-09 NOTE — Telephone Encounter (Signed)
 Forwarding to MD for advisement.......Virginia Huber  Dr. Lawana Pray switch to Toprol ?  (If so please advise dose)

## 2023-12-10 MED ORDER — DILTIAZEM HCL ER COATED BEADS 180 MG PO CP24: 180.0000 mg | ORAL_CAPSULE | Freq: Every day | ORAL | 3 refills | Status: DC

## 2023-12-10 NOTE — Telephone Encounter (Signed)
 Advised to start Diltiazem  180 mg to see if this makes an improvement in HRs.  Patient verbalized understanding and agreeable to plan.  She will call if SE occurs after starting.

## 2023-12-15 ENCOUNTER — Telehealth (HOSPITAL_COMMUNITY): Payer: Self-pay

## 2023-12-15 NOTE — Telephone Encounter (Signed)
 Spoke with patient to discuss upcoming procedure.   CT: completed. Notification sent to Dr. Inocencio to review findings.  Labs: completed.   Any recent signs of acute illness or been started on antibiotics? No Any new medications started? Diltiazem  per Dr. Inocencio Any medications to hold?  Hold Semaglutide  for 7 days prior to the procedure- last dose on June 19. Insulin  adjustments: Hold the bedtime dose of Lyumjev , the night before the procedure. On the day of the procedure: If CBG is greater than 220 mg/dL, you may take 1/2 of usual dose of Lyumjev . On the morning before the procedure: Take usual dose of Lantus . On the morning of the procedure: take only 1/2 of usual dose of your Lantus . Any missed doses of blood thinner?  No Advised patient to continue taking ANTICOAGULANT: Eliquis  (Apixaban ) twice daily without missing any doses.  Medication instructions:  On the morning of your procedure DO NOT take any medication., including Eliquis  or the procedure may be rescheduled. Nothing to eat or drink after midnight prior to your procedure.  Confirmed patient is scheduled for Atrial Fibrillation Ablation on Wednesday, July 2 with Dr. Soyla Inocencio. Instructed patient to arrive at the Main Entrance A at [facility]: [address] [place], [place] [address] and check in at Admitting at 6:30 AM.  Advised of plan to go home the same day and will only stay overnight if medically necessary. You MUST have a responsible adult to drive you home and MUST be with you the first 24 hours after you arrive home or your procedure could be cancelled.  Patient verbalized understanding to all instructions provided and agreed to proceed with procedure.

## 2023-12-21 DIAGNOSIS — E113411 Type 2 diabetes mellitus with severe nonproliferative diabetic retinopathy with macular edema, right eye: Secondary | ICD-10-CM | POA: Diagnosis not present

## 2023-12-21 DIAGNOSIS — H43391 Other vitreous opacities, right eye: Secondary | ICD-10-CM | POA: Diagnosis not present

## 2023-12-21 DIAGNOSIS — H353124 Nonexudative age-related macular degeneration, left eye, advanced atrophic with subfoveal involvement: Secondary | ICD-10-CM | POA: Diagnosis not present

## 2023-12-21 DIAGNOSIS — H34831 Tributary (branch) retinal vein occlusion, right eye, with macular edema: Secondary | ICD-10-CM | POA: Diagnosis not present

## 2023-12-21 DIAGNOSIS — H353111 Nonexudative age-related macular degeneration, right eye, early dry stage: Secondary | ICD-10-CM | POA: Diagnosis not present

## 2023-12-21 DIAGNOSIS — H348122 Central retinal vein occlusion, left eye, stable: Secondary | ICD-10-CM | POA: Diagnosis not present

## 2023-12-21 DIAGNOSIS — H35041 Retinal micro-aneurysms, unspecified, right eye: Secondary | ICD-10-CM | POA: Diagnosis not present

## 2023-12-21 NOTE — Pre-Procedure Instructions (Signed)
 Attempted to call patient regarding procedure instructions for tomorrow.  Left voicemail on the following.   Arrival time 0615 Nothing to eat or drink after midnight No meds AM of procedure Responsible person to drive you home and stay with you for 24 hrs  Have you missed any doses of anti-coagulant Eliquis - should be taken twice a day, if you have missed any doses please let us  know.  Don't take dose morning of procedure.

## 2023-12-22 ENCOUNTER — Ambulatory Visit (HOSPITAL_COMMUNITY)

## 2023-12-22 ENCOUNTER — Encounter (HOSPITAL_COMMUNITY)
Admission: RE | Disposition: A | Discharge status: Home / Self Care | Payer: Self-pay | Source: Home / Self Care | Attending: Cardiology

## 2023-12-22 ENCOUNTER — Encounter (HOSPITAL_COMMUNITY): Payer: Self-pay | Admitting: Cardiology

## 2023-12-22 ENCOUNTER — Other Ambulatory Visit: Payer: Self-pay

## 2023-12-22 ENCOUNTER — Ambulatory Visit (HOSPITAL_COMMUNITY)
Admission: RE | Admit: 2023-12-22 | Discharge: 2023-12-22 | Disposition: A | Discharge status: Home / Self Care | Attending: Cardiology | Admitting: Cardiology

## 2023-12-22 ENCOUNTER — Encounter: Payer: Self-pay | Admitting: Emergency Medicine

## 2023-12-22 DIAGNOSIS — I2510 Atherosclerotic heart disease of native coronary artery without angina pectoris: Secondary | ICD-10-CM | POA: Insufficient documentation

## 2023-12-22 DIAGNOSIS — I252 Old myocardial infarction: Secondary | ICD-10-CM | POA: Diagnosis not present

## 2023-12-22 DIAGNOSIS — Z8673 Personal history of transient ischemic attack (TIA), and cerebral infarction without residual deficits: Secondary | ICD-10-CM | POA: Insufficient documentation

## 2023-12-22 DIAGNOSIS — G4730 Sleep apnea, unspecified: Secondary | ICD-10-CM | POA: Insufficient documentation

## 2023-12-22 DIAGNOSIS — I4891 Unspecified atrial fibrillation: Secondary | ICD-10-CM

## 2023-12-22 DIAGNOSIS — I5033 Acute on chronic diastolic (congestive) heart failure: Secondary | ICD-10-CM

## 2023-12-22 DIAGNOSIS — N1832 Chronic kidney disease, stage 3b: Secondary | ICD-10-CM

## 2023-12-22 DIAGNOSIS — I130 Hypertensive heart and chronic kidney disease with heart failure and stage 1 through stage 4 chronic kidney disease, or unspecified chronic kidney disease: Secondary | ICD-10-CM

## 2023-12-22 DIAGNOSIS — E1151 Type 2 diabetes mellitus with diabetic peripheral angiopathy without gangrene: Secondary | ICD-10-CM | POA: Diagnosis not present

## 2023-12-22 DIAGNOSIS — I110 Hypertensive heart disease with heart failure: Secondary | ICD-10-CM | POA: Insufficient documentation

## 2023-12-22 DIAGNOSIS — I4892 Unspecified atrial flutter: Secondary | ICD-10-CM | POA: Diagnosis not present

## 2023-12-22 DIAGNOSIS — I071 Rheumatic tricuspid insufficiency: Secondary | ICD-10-CM | POA: Diagnosis not present

## 2023-12-22 DIAGNOSIS — Z794 Long term (current) use of insulin: Secondary | ICD-10-CM | POA: Diagnosis not present

## 2023-12-22 DIAGNOSIS — Z955 Presence of coronary angioplasty implant and graft: Secondary | ICD-10-CM | POA: Insufficient documentation

## 2023-12-22 DIAGNOSIS — Z950 Presence of cardiac pacemaker: Secondary | ICD-10-CM | POA: Diagnosis not present

## 2023-12-22 DIAGNOSIS — I361 Nonrheumatic tricuspid (valve) insufficiency: Secondary | ICD-10-CM

## 2023-12-22 DIAGNOSIS — I5022 Chronic systolic (congestive) heart failure: Secondary | ICD-10-CM | POA: Diagnosis not present

## 2023-12-22 DIAGNOSIS — I4819 Other persistent atrial fibrillation: Secondary | ICD-10-CM

## 2023-12-22 HISTORY — PX: ATRIAL FIBRILLATION ABLATION: EP1191

## 2023-12-22 HISTORY — PX: TRANSESOPHAGEAL ECHOCARDIOGRAM (CATH LAB): EP1270

## 2023-12-22 LAB — POCT ACTIVATED CLOTTING TIME: Activated Clotting Time: 469 s

## 2023-12-22 LAB — GLUCOSE, CAPILLARY
Glucose-Capillary: 178 mg/dL — ABNORMAL HIGH (ref 70–99)
Glucose-Capillary: 186 mg/dL — ABNORMAL HIGH (ref 70–99)

## 2023-12-22 SURGERY — ATRIAL FIBRILLATION ABLATION
Anesthesia: General

## 2023-12-22 MED ORDER — EPHEDRINE SULFATE-NACL 50-0.9 MG/10ML-% IV SOSY
PREFILLED_SYRINGE | INTRAVENOUS | Status: DC | PRN
  Administered 2023-12-22: 5 mg via INTRAVENOUS

## 2023-12-22 MED ORDER — SODIUM CHLORIDE 0.9 % IV SOLN: INTRAVENOUS | Status: DC

## 2023-12-22 MED ORDER — ONDANSETRON HCL 4 MG/2ML IJ SOLN: 4.0000 mg | Freq: Four times a day (QID) | INTRAMUSCULAR | Status: DC | PRN

## 2023-12-22 MED ORDER — PHENYLEPHRINE HCL-NACL 20-0.9 MG/250ML-% IV SOLN
INTRAVENOUS | Status: DC | PRN
  Administered 2023-12-22: 30 ug/min via INTRAVENOUS

## 2023-12-22 MED ORDER — VANCOMYCIN HCL IN DEXTROSE 1-5 GM/200ML-% IV SOLN: 1000.0000 mg | Freq: Once | INTRAVENOUS | Status: AC

## 2023-12-22 MED ORDER — SUGAMMADEX SODIUM 200 MG/2ML IV SOLN
INTRAVENOUS | Status: DC | PRN
  Administered 2023-12-22: 200 mg via INTRAVENOUS

## 2023-12-22 MED ORDER — PROTAMINE SULFATE 10 MG/ML IV SOLN
INTRAVENOUS | Status: DC | PRN
  Administered 2023-12-22: 40 mg via INTRAVENOUS

## 2023-12-22 MED ORDER — ONDANSETRON HCL 4 MG/2ML IJ SOLN
INTRAMUSCULAR | Status: DC | PRN
  Administered 2023-12-22: 4 mg via INTRAVENOUS

## 2023-12-22 MED ORDER — FENTANYL CITRATE (PF) 250 MCG/5ML IJ SOLN
INTRAMUSCULAR | Status: DC | PRN
  Administered 2023-12-22 (×2): 50 ug via INTRAVENOUS

## 2023-12-22 MED ORDER — ROCURONIUM BROMIDE 10 MG/ML (PF) SYRINGE
PREFILLED_SYRINGE | INTRAVENOUS | Status: DC | PRN
  Administered 2023-12-22: 60 mg via INTRAVENOUS

## 2023-12-22 MED ORDER — PROPOFOL 10 MG/ML IV BOLUS
INTRAVENOUS | Status: DC | PRN
  Administered 2023-12-22: 100 mg via INTRAVENOUS

## 2023-12-22 MED ORDER — FENTANYL CITRATE (PF) 100 MCG/2ML IJ SOLN
INTRAMUSCULAR | Status: AC
  Filled 2023-12-22: qty 2

## 2023-12-22 MED ORDER — HEPARIN SODIUM (PORCINE) 1000 UNIT/ML IJ SOLN
INTRAMUSCULAR | Status: DC | PRN
  Administered 2023-12-22: 14000 [IU] via INTRAVENOUS

## 2023-12-22 MED ORDER — VANCOMYCIN HCL IN DEXTROSE 1-5 GM/200ML-% IV SOLN
INTRAVENOUS | Status: AC
  Filled 2023-12-22: qty 200

## 2023-12-22 MED ORDER — ATROPINE SULFATE 1 MG/ML IV SOLN
INTRAVENOUS | Status: DC | PRN
  Administered 2023-12-22: 1 mg via INTRAVENOUS

## 2023-12-22 MED ORDER — DEXAMETHASONE SODIUM PHOSPHATE 10 MG/ML IJ SOLN
INTRAMUSCULAR | Status: DC | PRN
  Administered 2023-12-22: 5 mg via INTRAVENOUS

## 2023-12-22 MED ORDER — PHENYLEPHRINE 80 MCG/ML (10ML) SYRINGE FOR IV PUSH (FOR BLOOD PRESSURE SUPPORT)
PREFILLED_SYRINGE | INTRAVENOUS | Status: DC | PRN
  Administered 2023-12-22: 160 ug via INTRAVENOUS

## 2023-12-22 MED ORDER — HEPARIN (PORCINE) IN NACL 1000-0.9 UT/500ML-% IV SOLN
INTRAVENOUS | Status: DC | PRN
  Administered 2023-12-22 (×3): 500 mL

## 2023-12-22 MED ORDER — VANCOMYCIN HCL 1000 MG IV SOLR
INTRAVENOUS | Status: DC | PRN
  Administered 2023-12-22: 1000 mg via INTRAVENOUS

## 2023-12-22 MED ORDER — LIDOCAINE 2% (20 MG/ML) 5 ML SYRINGE
INTRAMUSCULAR | Status: DC | PRN
  Administered 2023-12-22: 100 mg via INTRAVENOUS

## 2023-12-22 SURGICAL SUPPLY — 17 items
CABLE FARASTAR GEN2 SNGL USE (CABLE) IMPLANT
CATH FARAWAVE 2.0 31 (CATHETERS) IMPLANT
CATH GE 8FR SOUNDSTAR (CATHETERS) IMPLANT
CATH OCTARAY 2.0 F 3-3-3-3-3 (CATHETERS) IMPLANT
CATH WEBSTER BI DIR CS D-F CRV (CATHETERS) IMPLANT
CLOSURE MYNX CONTROL 6F/7F (Vascular Products) IMPLANT
CLOSURE PERCLOSE PROSTYLE (VASCULAR PRODUCTS) IMPLANT
COVER SWIFTLINK CONNECTOR (BAG) ×2 IMPLANT
DILATOR VESSEL 38 20CM 16FR (INTRODUCER) IMPLANT
GUIDEWIRE INQWIRE 1.5J.035X260 (WIRE) IMPLANT
KIT VERSACROSS CNCT FARADRIVE (KITS) IMPLANT
PACK EP LF (CUSTOM PROCEDURE TRAY) ×2 IMPLANT
PAD DEFIB RADIO PHYSIO CONN (PAD) ×2 IMPLANT
PATCH CARTO3 (PAD) IMPLANT
SHEATH FARADRIVE STEERABLE (SHEATH) IMPLANT
SHEATH PINNACLE 8F 10CM (SHEATH) IMPLANT
SHEATH PINNACLE 9F 10CM (SHEATH) IMPLANT

## 2023-12-22 NOTE — Anesthesia Procedure Notes (Signed)
 Procedure Name: Intubation Date/Time: 12/22/2023 8:21 AM  Performed by: Elby Raelene SAUNDERS, CRNAPre-anesthesia Checklist: Patient identified, Emergency Drugs available, Suction available and Patient being monitored Patient Re-evaluated:Patient Re-evaluated prior to induction Oxygen  Delivery Method: Circle System Utilized Preoxygenation: Pre-oxygenation with 100% oxygen  Induction Type: IV induction Ventilation: Mask ventilation without difficulty Laryngoscope Size: Miller and 2 Grade View: Grade I Tube type: Oral Tube size: 7.0 mm Number of attempts: 1 Airway Equipment and Method: Stylet, Oral airway and Bite block Placement Confirmation: ETT inserted through vocal cords under direct vision, positive ETCO2 and breath sounds checked- equal and bilateral Secured at: 21 cm Tube secured with: Tape Dental Injury: Teeth and Oropharynx as per pre-operative assessment

## 2023-12-22 NOTE — Anesthesia Preprocedure Evaluation (Signed)
 Anesthesia Evaluation  Patient identified by MRN, date of birth, ID band Patient awake    Reviewed: Allergy & Precautions, NPO status , Patient's Chart, lab work & pertinent test results, reviewed documented beta blocker date and time   History of Anesthesia Complications Negative for: history of anesthetic complications  Airway Mallampati: II  TM Distance: >3 FB Neck ROM: Full    Dental  (+) Missing,    Pulmonary sleep apnea and Continuous Positive Airway Pressure Ventilation    Pulmonary exam normal        Cardiovascular hypertension, Pt. on medications and Pt. on home beta blockers + CAD, + Past MI, + Cardiac Stents (last in 2017), + Peripheral Vascular Disease and +CHF  Normal cardiovascular exam+ pacemaker   TTE 03/2023: 1. Left ventricular ejection fraction, by estimation, is 45 to 50%. The  left ventricle has mildly decreased function. The left ventricle  demonstrates global hypokinesis. The left ventricular internal cavity size  was mildly dilated. There is mild  concentric left ventricular hypertrophy. Left ventricular diastolic  parameters are consistent with Grade II diastolic dysfunction  (pseudonormalization).   2. Right ventricular systolic function is normal. The right ventricular  size is normal. Tricuspid regurgitation signal is inadequate for assessing  PA pressure.   3. Left atrial size was moderately dilated.   4. The mitral valve is degenerative. Trivial mitral valve regurgitation.  Moderate mitral annular calcification.   5. The aortic valve is tricuspid. There is mild calcification of the  aortic valve. Aortic valve regurgitation is not visualized. Aortic valve  sclerosis/calcification is present, without any evidence of aortic  stenosis.   6. The inferior vena cava is normal in size with greater than 50%  respiratory variability, suggesting right atrial pressure of 3 mmHg.      Neuro/Psych TIA  negative psych ROS   GI/Hepatic negative GI ROS, Neg liver ROS,,,  Endo/Other  diabetes, Type 2, Insulin  DependentHypothyroidism    Renal/GU Renal InsufficiencyRenal disease  negative genitourinary   Musculoskeletal  (+) Arthritis ,    Abdominal   Peds  Hematology negative hematology ROS (+)   Anesthesia Other Findings Day of surgery medications reviewed with patient.  Reproductive/Obstetrics negative OB ROS                              Anesthesia Physical Anesthesia Plan  ASA: 3  Anesthesia Plan: General   Post-op Pain Management: Minimal or no pain anticipated   Induction: Intravenous  PONV Risk Score and Plan: 3 and Treatment may vary due to age or medical condition, Ondansetron  and Dexamethasone   Airway Management Planned: Oral ETT  Additional Equipment: None  Intra-op Plan:   Post-operative Plan: Extubation in OR  Informed Consent: I have reviewed the patients History and Physical, chart, labs and discussed the procedure including the risks, benefits and alternatives for the proposed anesthesia with the patient or authorized representative who has indicated his/her understanding and acceptance.     Dental advisory given  Plan Discussed with: CRNA  Anesthesia Plan Comments:          Anesthesia Quick Evaluation

## 2023-12-22 NOTE — Addendum Note (Signed)
 Addendum  created 12/22/23 1306 by Elby Raelene SAUNDERS, CRNA   Attestation recorded in Intraprocedure, Flowsheet accepted, [employer] filed

## 2023-12-22 NOTE — Transfer of Care (Signed)
 Immediate Anesthesia Transfer of Care Note  Patient: Ronal KATHEE Matt  Procedure(s) Performed: ATRIAL FIBRILLATION ABLATION TRANSESOPHAGEAL ECHOCARDIOGRAM  Patient Location: Cath Lab  Anesthesia Type:General  Level of Consciousness: awake, alert , and oriented  Airway & Oxygen  Therapy: Patient Spontanous Breathing and Patient connected to nasal cannula oxygen   Post-op Assessment: Report given to RN and Post -op Vital signs reviewed and stable  Post vital signs: Reviewed and stable  Last Vitals:  Vitals Value Taken Time  BP    Temp    Pulse 64 12/22/23 10:14  Resp 13 12/22/23 10:14  SpO2 93 % 12/22/23 10:14  Vitals shown include unfiled device data.  Last Pain:  Vitals:   12/22/23 0635  TempSrc: Oral  PainSc: 0-No pain         Complications: There were no known notable events for this encounter.

## 2023-12-22 NOTE — H&P (Signed)
  Electrophysiology Office Note:   Date:  12/22/2023  ID:  Virginia Huber, DOB 1946-03-07, MRN [id number]  Primary Cardiologist: Jayson Sierras, MD Primary Heart Failure: None Electrophysiologist: Burna Atlas Gladis Norton, MD      History of Present Illness:   Virginia Huber is a 78 y.o. female with h/o sick sinus syndrome's post pacemaker, atrial fibrillation/flutter, chronic systolic heart failure, hypertension, coronary artery disease, peripheral arterial disease, sleep apnea seen today for routine electrophysiology followup.   She has had multiple ablations for atrial fibrillation and atrial flutter.  She had cardioversion 08/16/2023 for atrial flutter.  She is unfortunately had more episodes of atrial fibrillation/flutter.  Today, denies symptoms of palpitations, chest pain, dyspnea, orthopnea, PND, lower extremity edema, claudication, dizziness, presyncope, syncope, bleeding, or neurologic sequela. The patient is tolerating medications without difficulties. Plan ablation today.      EP Information / Studies Reviewed:    EKG is not ordered today. EKG from 08/23/23 reviewed which showed atrial flutter      PPM Interrogation-  reviewed in detail today,  See PACEART report.  Device History: Medtronic Dual Chamber PPM implanted 06/25/2016 for Sinus Node Dysfunction  Risk Assessment/Calculations:    CHA2DS2-VASc Score = 6   This indicates a 9.7% annual risk of stroke. The patient's score is based upon: CHF History: 1 HTN History: 1 Diabetes History: 0 Stroke History: 0 Vascular Disease History: 1 Age Score: 2 Gender Score: 1            Physical Exam:   VS:  BP (!) 150/73   Pulse 78   Temp 98.1 F (36.7 C) (Oral)   Resp 18   Ht 5' 3 (1.6 m)   Wt 59.9 kg   SpO2 93%   BMI 23.38 kg/m    Wt Readings from Last 3 Encounters:  12/22/23 59.9 kg  12/03/23 60.8 kg  10/05/23 59.9 kg    GEN: No acute distress.   Neck: No JVD Cardiac: RRR, no murmurs, rubs, or gallops.   Respiratory: normal BS bilaterally. GI: Soft, nontender, non-distended  MS: No edema; No deformity. Neuro:  Nonfocal  Skin: warm and dry Psych: Normal affect    ASSESSMENT AND PLAN:    SND s/p Medtronic PPM  Normal PPM function See Pace Art report No changes today  2.  Persistent atrial fibrillation/flutter: Virginia Huber has presented today for surgery, with the diagnosis of AF.  The various methods of treatment have been discussed with the patient and family. After consideration of risks, benefits and other options for treatment, the patient has consented to  Procedure(s): Catheter ablation as a surgical intervention .  Risks include but not limited to complete heart block, stroke, esophageal damage, nerve damage, bleeding, vascular damage, tamponade, perforation, MI, and death. The patient's history has been reviewed, patient examined, no change in status, stable for surgery.  I have reviewed the patient's chart and labs.  Questions were answered to the patient's satisfaction.    Virginia Partin Norton, MD 12/22/2023 7:32 AM

## 2023-12-22 NOTE — Anesthesia Postprocedure Evaluation (Signed)
 Anesthesia Post Note  Patient: Virginia Huber  Procedure(s) Performed: ATRIAL FIBRILLATION ABLATION TRANSESOPHAGEAL ECHOCARDIOGRAM     Patient location during evaluation: PACU Anesthesia Type: General Level of consciousness: awake and alert Pain management: pain level controlled Vital Signs Assessment: post-procedure vital signs reviewed and stable Respiratory status: spontaneous breathing, nonlabored ventilation and respiratory function stable Cardiovascular status: blood pressure returned to baseline Postop Assessment: no apparent nausea or vomiting Anesthetic complications: no   There were no known notable events for this encounter.  Last Vitals:  Vitals:   12/22/23 1200 12/22/23 1230  BP: (!) 133/59 (!) 126/56  Pulse: 64 61  Resp: (!) 21 (!) 25  Temp:    SpO2: (!) 88% 91%    Last Pain:  Vitals:   12/22/23 1141  TempSrc:   PainSc: 0-No pain                 [name]

## 2023-12-22 NOTE — Procedures (Signed)
   TRANSESOPHAGEAL ECHOCARDIOGRAM  NAME:  Virginia Huber    MRN: [id number] DOB:  09-Jul-1945    ADMIT DATE: 12/22/2023  INDICATIONS: Atrial fibrillation  PROCEDURE:   Informed consent was obtained prior to the procedure. The risks, benefits and alternatives for the procedure were discussed and the patient comprehended these risks.  Risks include, but are not limited to, cough, sore throat, vomiting, nausea, somnolence, esophageal and stomach trauma or perforation, bleeding, low blood pressure, aspiration, pneumonia, infection, trauma to the teeth and death.    After a procedural timeout, the patient was administered propofol  per anesthesia.  The patient's heart rate, blood pressure, and oxygen  saturation were monitored continuously during the procedure.   The transesophageal probe was inserted in the esophagus and stomach without difficulty and multiple views were obtained.  The patient was kept under observation until the patient left the procedure room.  The patient left the procedure room in stable condition.    COMPLICATIONS:    Complications: No complications.  The patient had normal neuro status and respiratory status post procedure with vitals stable as recorded elsewhere.  Adequate airway was maintained throughout and vital signs monitored per protocol.  KEY FINDINGS:  No evidence of LAA thrombus Mildly reduced LV function No significant valvular disease Full Report to follow.   Morene Brownie Advanced Heart Failure 8:54 AM

## 2023-12-22 NOTE — Discharge Instructions (Signed)

## 2023-12-23 ENCOUNTER — Telehealth (HOSPITAL_COMMUNITY): Payer: Self-pay

## 2023-12-23 ENCOUNTER — Encounter (HOSPITAL_COMMUNITY): Payer: Self-pay | Admitting: Cardiology

## 2023-12-23 LAB — ECHO TEE

## 2023-12-23 MED FILL — Fentanyl Citrate Preservative Free (PF) Inj 100 MCG/2ML: INTRAMUSCULAR | Qty: 2 | Status: AC

## 2023-12-23 NOTE — Telephone Encounter (Signed)
 Spoke with patient to complete post procedure follow up call.  Patient reports no complications with groin sites.   Instructions reviewed with patient:  Remove large bandage at puncture site after 24 hours. It is normal to have bruising, tenderness, mild swelling, and a pea or marble sized lump/knot at the groin site which can take up to three months to resolve.  Get help right away if you notice sudden swelling at the puncture site.  Check your puncture site every day for signs of infection: fever, redness, swelling, pus drainage, warmth, foul odor or excessive pain. If this occurs, please call the office at [phone], to speak with the nurse. Get help right away if your puncture site is bleeding and the bleeding does not stop after applying firm pressure to the area.  You may continue to have skipped beats/ atrial fibrillation during the first several months after your procedure.  It is very important not to miss any doses of your blood thinner Eliquis . Patient restarted taking this medication on 12/22/23.   You will follow up with the Afib clinic on 01/19/24 and follow up with the Afib clinic on 03/23/24.   Patient verbalized understanding to all instructions provided.

## 2023-12-28 ENCOUNTER — Encounter: Payer: Self-pay | Admitting: Internal Medicine

## 2023-12-28 ENCOUNTER — Encounter: Payer: Self-pay | Admitting: Cardiology

## 2023-12-29 MED ORDER — FREESTYLE LIBRE 2 PLUS SENSOR MISC: 1.0000 | 3 refills | Status: AC

## 2023-12-31 NOTE — Telephone Encounter (Signed)
 Discussed with [employer] rep.  Infrequent low heart rates could be related to device programming AAIR-DDDR 50.  Pt's AV delay measures around 300 ms.  Recommendations:   Increase base rate to 60 bpm (try this first) Reprogram DDDR with long AV delay (if increased base rate does not resolve symptoms try this second)  Pt scheduled for device clinic appointment on 01/19/2024 at 3:00 pm after her afib clinic appt for reprogramming.

## 2024-01-05 ENCOUNTER — Ambulatory Visit: Payer: HMO

## 2024-01-05 DIAGNOSIS — R809 Proteinuria, unspecified: Secondary | ICD-10-CM | POA: Diagnosis not present

## 2024-01-05 DIAGNOSIS — I495 Sick sinus syndrome: Secondary | ICD-10-CM | POA: Diagnosis not present

## 2024-01-06 ENCOUNTER — Ambulatory Visit: Payer: Self-pay | Admitting: Cardiology

## 2024-01-06 LAB — CUP PACEART REMOTE DEVICE CHECK
Battery Remaining Longevity: 25 mo
Battery Voltage: 2.95 V
Brady Statistic AP VP Percent: 0.01 %
Brady Statistic AP VS Percent: 37.72 %
Brady Statistic AS VP Percent: 0.04 %
Brady Statistic AS VS Percent: 62.23 %
Brady Statistic RA Percent Paced: 37.72 %
Brady Statistic RV Percent Paced: 0.05 %
Date Time Interrogation Session: 20250716093933
Implantable Lead Connection Status: 753985
Implantable Lead Connection Status: 753985
Implantable Lead Implant Date: 20180104
Implantable Lead Implant Date: 20180104
Implantable Lead Location: 753859
Implantable Lead Location: 753860
Implantable Lead Model: 5076
Implantable Lead Model: 5076
Implantable Pulse Generator Implant Date: 20180104
Lead Channel Impedance Value: 323 Ohm
Lead Channel Impedance Value: 361 Ohm
Lead Channel Impedance Value: 380 Ohm
Lead Channel Impedance Value: 437 Ohm
Lead Channel Pacing Threshold Amplitude: 0.625 V
Lead Channel Pacing Threshold Amplitude: 1.125 V
Lead Channel Pacing Threshold Pulse Width: 0.4 ms
Lead Channel Pacing Threshold Pulse Width: 0.4 ms
Lead Channel Sensing Intrinsic Amplitude: 14.5 mV
Lead Channel Sensing Intrinsic Amplitude: 14.5 mV
Lead Channel Sensing Intrinsic Amplitude: 2 mV
Lead Channel Sensing Intrinsic Amplitude: 2 mV
Lead Channel Setting Pacing Amplitude: 2 V
Lead Channel Setting Pacing Amplitude: 2.5 V
Lead Channel Setting Pacing Pulse Width: 0.4 ms
Lead Channel Setting Sensing Sensitivity: 2.8 mV
Zone Setting Status: 755011

## 2024-01-11 ENCOUNTER — Encounter: Payer: Self-pay | Admitting: Nurse Practitioner

## 2024-01-11 ENCOUNTER — Ambulatory Visit: Attending: Nurse Practitioner | Admitting: Nurse Practitioner

## 2024-01-11 VITALS — BP 161/82 | HR 78 | Ht 63.0 in | Wt 128.2 lb

## 2024-01-11 DIAGNOSIS — I2510 Atherosclerotic heart disease of native coronary artery without angina pectoris: Secondary | ICD-10-CM

## 2024-01-11 DIAGNOSIS — I5032 Chronic diastolic (congestive) heart failure: Secondary | ICD-10-CM

## 2024-01-11 DIAGNOSIS — I951 Orthostatic hypotension: Secondary | ICD-10-CM

## 2024-01-11 DIAGNOSIS — G4733 Obstructive sleep apnea (adult) (pediatric): Secondary | ICD-10-CM

## 2024-01-11 DIAGNOSIS — I4891 Unspecified atrial fibrillation: Secondary | ICD-10-CM

## 2024-01-11 DIAGNOSIS — I739 Peripheral vascular disease, unspecified: Secondary | ICD-10-CM

## 2024-01-11 DIAGNOSIS — I4892 Unspecified atrial flutter: Secondary | ICD-10-CM

## 2024-01-11 DIAGNOSIS — I10 Essential (primary) hypertension: Secondary | ICD-10-CM

## 2024-01-11 DIAGNOSIS — I480 Paroxysmal atrial fibrillation: Secondary | ICD-10-CM

## 2024-01-11 DIAGNOSIS — Z950 Presence of cardiac pacemaker: Secondary | ICD-10-CM

## 2024-01-11 MED ORDER — OLMESARTAN MEDOXOMIL 5 MG PO TABS
10.0000 mg | ORAL_TABLET | Freq: Two times a day (BID) | ORAL | 2 refills | Status: AC
Start: 2024-01-11 — End: ?

## 2024-01-11 NOTE — Progress Notes (Unsigned)
 Cardiology Office Note:    Date:  01/11/2024 ID:  Virginia Huber, DOB [birth year], MRN [id number] PCP:  Marvine Rush, MD [facility] Providers Cardiologist:  Jayson Sierras, MD Electrophysiologist:  Will Gladis Norton, MD  Referring MD: Marvine Rush, MD   CC: Here for follow-up  History of Present Illness:    Virginia Huber is a 78 y.o. female with a PMH of CAD, s/p angioplasty to OM1 in 2016, DES to Cx in 2016, and DES to RCA in 2017, PAF, PAD, HTN, HFpEF, OSA, CKD stage 3b (Follows Nephrology), SSS, s/p Medtronic PPM in 2018, and hx of orthostatic hypotension (past hx of fall), who presents today for scheduled follow-up.  Seen by Dr. Sierras 02/2022.  Was reportedly doing much better, denied any palpitations.  Saw Dr. Norton later that month and reported orthostatic dizziness a few times/week, also occurred at other times as well. Was holding medicines, somewhat helped.  SBP's later on were averaging 170s, referred to hypertension clinic. Amio seemed to make tremors worse, but was willing to continue if Multaq  was unaffordable.  Visited ED with substernal CP 04/2022, was SHOB/ with diaphoresis. Had a mechanical fall 5 days PTA, went to UC due to L-sided rib pain, x-rays negative. Troponin 20 >> 24.  CXR negative.  EKG revealed ST, with IVCD.  Transferred to [facility]. Underwent LHC that revealed mild to moderate diffuse disease of LAD without focal stenosis, patent stents, with chronic occlusion of small caliber first OM branch. Small caliber distal PL artery beyond the stented segment, moderately severe disease at bifurcation, vessels were too small for PCI.  Medical management recommended.  TTE revealed EF 40 to 45%, RWMA's of left ventricle, severe concentric LVH, trivial MR. Successful TEE with DCCV on 11/21. TEE revealed EF 50-55%, severe LVH.   Pre-procedure TEE prior to ablation 07/2022 showed EF 30-35%, mild MR. Pt had shortness of breath, admitted for further observation,  given IV lasix  and CXR showed cardiomegaly with interstitial edema, small effusion, CHF favored with LLL collapse/consolidation. SOB resolved and post-op bilateral groins oozed blood, eventually resolved. After PVI, converted to NSR on Amiodarone .   I last saw patient for follow-up on August 31, 2022. She was doing better since previous office visit. Denied any chest pain, shortness of breath, palpitations, syncope, presyncope, dizziness, orthopnea, PND, swelling or significant weight changes, acute bleeding, or claudication. Still felt fatigued. Denied any other issues.   Since I last saw patient she underwent A-fib ablation in June 2024.  Has been followed by EP and A-fib clinic.  Was noted to have some tremulousness and amiodarone  was stopped, slightly improved after stopping amiodarone .  Was to be evaluated by neurology at the time.  Hospitalized for chest pain in October 2024.  Was noted to have a UTI and hypokalemia, both were treated.  Echocardiogram revealed improved LVEF at 45 to 50%.  Troponins were reassuring.  05/17/2023 - Today she presents for scheduled follow-up and hospital follow-up.  She states she is doing well and denies any CP since leaving the hospital. Denies any chest pain, shortness of breath, palpitations, syncope, presyncope, dizziness, orthopnea, PND, swelling or significant weight changes, acute bleeding, or claudication.  She underwent cardioversion in February 2025, however patient returned back out of rhythm after cardioversion.  09/09/2023 -tells me she is now in persistent A-fib, tells me she is being followed closely by electrophysiology.  Saw electrophysiology APP on 08/23/2023.  Patient says Tikosyn was discussed however she said Dr. Norton said this would  not be a good medication for her.  Was mentioned at this visit and she may be a candidate for pulsed field ablation. Denies any chest pain, shortness of breath, palpitations, syncope, presyncope, dizziness, orthopnea,  PND, swelling or significant weight changes, acute bleeding, or claudication.  She underwent ablation on 12/22/2023. Tolerated procedure well.   01/11/2024 - Here for 4-6 month follow-up. Doing well since ablation. Does state SBPs at home average 160's. Denies any chest pain, shortness of breath, palpitations, syncope, presyncope, dizziness, orthopnea, PND, swelling or significant weight changes, acute bleeding, or claudication.  ROS:    Please see the history of present illness.    All other systems reviewed and are negative.  EKGs/Labs/Other Studies Reviewed:    The following studies were reviewed today:   EKG:  EKG is not ordered today.   TEE 12/2023:   1. Left atrial size was mild to moderately dilated. No left atrial/left  atrial appendage thrombus was detected.   2. Evidence of atrial level shunting detected by color flow Doppler.  Small left to right shunting likely due to previous atrial septostomy.   3. Left ventricular ejection fraction, by estimation, is 50 to 55%. The  left ventricle has low normal function. The left ventricle demonstrates  global hypokinesis.   4. The aortic valve is tricuspid. Aortic valve regurgitation is not  visualized.   5. Right ventricular systolic function is normal. The right ventricular  size is normal.   6. The mitral valve is normal in structure. Trivial mitral valve  regurgitation.  Echo 03/2023:  1. Left ventricular ejection fraction, by estimation, is 45 to 50%. The  left ventricle has mildly decreased function. The left ventricle  demonstrates global hypokinesis. The left ventricular internal cavity size was mildly dilated. There is mild concentric left ventricular hypertrophy. Left ventricular diastolic parameters are consistent with Grade II diastolic dysfunction (pseudonormalization).   2. Right ventricular systolic function is normal. The right ventricular  size is normal. Tricuspid regurgitation signal is inadequate for assessing PA  pressure.   3. Left atrial size was moderately dilated.   4. The mitral valve is degenerative. Trivial mitral valve regurgitation.  Moderate mitral annular calcification.   5. The aortic valve is tricuspid. There is mild calcification of the  aortic valve. Aortic valve regurgitation is not visualized. Aortic valve  sclerosis/calcification is present, without any evidence of aortic  stenosis.   6. The inferior vena cava is normal in size with greater than 50%  respiratory variability, suggesting right atrial pressure of 3 mmHg.   Comparison(s): Prior images reviewed side by side. LVEF has improved, now in the range of 45-50%.  TEE on 05/12/2022: 1. Left ventricular ejection fraction, by estimation, is 50 to 55%. The  left ventricle has low normal function. The left ventricle has no regional  wall motion abnormalities. There is severe concentric left ventricular  hypertrophy.   2. Right ventricular systolic function is normal. The right ventricular  size is normal.   3. Left atrial size was mildly dilated. No left atrial/left atrial  appendage thrombus was detected. The LAA emptying velocity was 16 cm/s.   4. The mitral valve is normal in structure. Mild mitral valve  regurgitation. No evidence of mitral stenosis.   5. The aortic valve is tricuspid. Aortic valve regurgitation is not  visualized. Aortic valve sclerosis/calcification is present, without any  evidence of aortic stenosis.   6. The inferior vena cava is normal in size with greater than 50%  respiratory variability,  suggesting right atrial pressure of 3 mmHg.   Conclusion(s)/Recommendation(s): Normal biventricular function without  evidence of hemodynamically significant valvular heart disease.   Left heart cath on 05/11/22:   Prox Cx to Mid Cx lesion is 10% stenosed.   1st Mrg lesion is 100% stenosed.   Ost LAD to Mid LAD lesion is 30% stenosed.   Mid LAD to Dist LAD lesion is 30% stenosed.   Ost RCA to Prox RCA  lesion is 40% stenosed.   RPAV-1 lesion is 20% stenosed.   RPAV-2 lesion is 70% stenosed.   2nd RPL lesion is 70% stenosed.   The LAD is a moderate caliber vessel that courses to the apex. There appears to be diffuse mild to moderate disease in the entire LAD without a focal stenosis.  2. The Circumflex has a patent proximal stented segment. This vessel terminates into an obtuse marginal branch. The small caliber first obtuse marginal branch is chronically occluded.  3. The RCA is a large caliber dominant vessel with a patent stent in the posterolateral artery. The small caliber distal posterolateral artery beyond the stented segment has moderately severe disease at a bifurcation but the vessels appear too small for PCI.  4. Normal LVEDP   Recommendations: Medical management of CAD. Consider adding Imdur  or Ranexa  for angina in setting of diffuse small vessel disease.    2D Echo complete on 05/10/2022: 1. Severe hypokinesis of the anteroseptal wall with overall mild LV  dysfunction.   2. Left ventricular ejection fraction, by estimation, is 40 to 45%. The  left ventricle has mildly decreased function. The left ventricle  demonstrates regional wall motion abnormalities (see scoring  diagram/findings for description). There is severe  concentric left ventricular hypertrophy. Left ventricular diastolic  parameters are indeterminate.   3. Right ventricular systolic function is normal. The right ventricular  size is normal.   4. The mitral valve is normal in structure. Trivial mitral valve  regurgitation. No evidence of mitral stenosis.   5. The aortic valve is tricuspid. Aortic valve regurgitation is not  visualized. Aortic valve sclerosis is present, with no evidence of aortic  valve stenosis.   6. The inferior vena cava is normal in size with greater than 50%  respiratory variability, suggesting right atrial pressure of 3 mmHg.  Lexiscan  on 01/20/2018: Downsloping ST segment depression  ST segment depression of 0.5 mm was noted during stress in the II, III, aVF, V5 and V6 leads. Defect 1: There is a small defect of mild severity present in the apical anterior and apical septal location. This likely represents soft tissue attenuation. A small degree of scar cannot entirely be excluded. No signficant ischemia. This is a low risk study. Nuclear stress EF: 64%.  Left heart cath on 08/02/2015: 1st RPLB lesion, 50% stenosed. Dist RCA lesion, 30% stenosed. Ost 1st Mrg to 1st Mrg lesion, 100% stenosed. Ost LAD lesion, 40% stenosed. Mid LAD lesion, 40% stenosed. Post Atrio lesion, 90% stenosed. Post intervention, there is a 0% residual stenosis. The left ventricular systolic function is normal. Mid RCA lesion, 20% stenosed.   Normal LV function without focal segmental wall motion abnormalities and ejection fraction of 55%.   Significant coronary obstructive disease with diffuse mild luminal narrowing of the LAD with 40% proximal and mid stenoses; widely patent stent in the proximal circumflex with old occlusion of the marginal branch which had arisen in the region of the stented segment but with evidence for very faint collateralization to this distal marginal vessel from  the the LAD; a large dominant RCA with 20%, mild mid narrowing, 30% narrowing after the acute margin and focal 90% stenosis distally prior to a PDA 2 vessel with 50% narrowing at the ostium of this PDA prior to a bend in the vessel with the RCA ending in the PLA vessel.   Successful percutaneous cardiac intervention to the distal RCA treated with Angiosculpt scoring balloon, PTCA, and ultimate stenting with a 2.58 mm Xience Alpine DES stent postdilated to 2.51 mm with a 90% stenosis reduced to 0% and no change in the ostial PDA narrowing.   RECOMMENDATION: Since the patient suffered a non-ST segment elevation myocardial infarction on Plavix , Brilinta  was administered for oral antiplatelet therapy.  It was felt most  likely the patient's atrial fibrillation was ischemic mediated.  If anticoagulation is necessary, Brilinta  will need to be discontinued and changed back to Plavix  due to potential bleed risk if  triple therapy is needed short-term.  Risk Assessment/Calculations:    CHA2DS2-VASc Score = 6  This indicates a 9.7% annual risk of stroke. The patient's score is based upon: CHF History: 1 HTN History: 1 Diabetes History: 0 Stroke History: 0 Vascular Disease History: 1 Age Score: 2 Gender Score: 1  Physical Exam:    VS:  BP (!) 161/82 (BP Location: Left Arm)   Pulse 78   Ht 5' 3 (1.6 m)   Wt 128 lb 3.2 oz (58.2 kg)   SpO2 97%   BMI 22.71 kg/m     Wt Readings from Last 3 Encounters:  01/11/24 128 lb 3.2 oz (58.2 kg)  12/22/23 132 lb (59.9 kg)  12/03/23 134 lb (60.8 kg)    GEN: Well nourished, well developed in no acute distress HEENT: Normal NECK: No JVD; No carotid bruits CARDIAC: S1/S2, RRR, no murmurs, rubs, gallops; 2+ pulses  RESPIRATORY:  Clear and diminished to auscultation without rales, wheezing or rhonchi  MUSCULOSKELETAL:  No edema; No deformity  SKIN: Thin skin, warm and dry NEUROLOGIC:  Alert and oriented x 3, improved tremors  PSYCHIATRIC:  Normal affect   ASSESSMENT & PLAN:    In order of problems listed above:   Persistent A-fib/A-flutter, s/p PVI ablation 07/2022, long term anticoagulation  Denies any palpitations or tachycardia. CHA2DS2-VASc score 6. Continue current medication regimen.  Currently she is on appropriate dosage of Eliquis , denies any issues.  Discussed avoiding triggers to A-fib.  Continue rest of current medication regimen.  Medication therapy is limited due to her past history.  Continue to follow-up with up with A-fib clinic and EP.    CAD, s/p angioplasty to OM1 in 2016, DES to circumflex in 2016, and DES to RCA in 2017 Denies any CP. No indication for ischemic evaluation. Continue current meds. ED precautions discussed. Heart healthy diet  and regular cardiovascular exercise encouraged.   3. PAD ABI's in 2015 revealed moderate depression of resting left ABI to 0.66, segmental analysis revealed pattern of tibial and potentially distal SFA/popliteal disease with digital disease also suspected; no evidence of significant arterial occlusive disease in right lower extremity.  She denies any recent claudication symptoms.  Continue current medication regimen.  4. HFimpEF, atrial shunting Stage C, NYHA class I-II symptoms. TEE showed EF 50-55%. Evidence of atrial shunting (small left ot right shunting) likely d/t previous atrial septostomy. Denies any concerning signs or symptoms. Will continue to monitor. Euvolemic and well compensated on exam. Low sodium diet, fluid restriction <2L, and daily weights encouraged. Educated to contact our office for weight gain  of 2 lbs overnight or 5 lbs in one week. Continue current medication regimen. Continue current medication regimen. Heart healthy diet and regular cardiovascular exercise encouraged.   5. Hypertension Blood pressure not at goal. Will increase olmesartan  to 10 mg BID and obtain BMET in 2 weeks. Will bring her back in 2-3 weeks for a BP check. Continue rest of medication regimen. Discussed to monitor BP at home at least 2 hours after medications and sitting for 5-10 minutes. Heart healthy diet and regular cardiovascular exercise encouraged.   6. SSS, s/p Pacemaker Most recent remote device check revealed normal device function. Continue to follow up with EP.   7. Orthostatic hypotension Denies any recent symptoms.  Continue current medication regimen.  Continue to follow with PCP.  8. OSA on CPAP Does report wearing CPAP. Continue to follow with PCP.  9.  Disposition: Care and ED precautions discussed. Follow-up with me or Dr. Debera in 6 months or sooner if anything changes.    Medication Adjustments/Labs and Tests Ordered: Current medicines are reviewed at length with the patient  today.  Concerns regarding medicines are outlined above.  Orders Placed This Encounter  Procedures   Basic Metabolic Panel (BMET)   Meds ordered this encounter  Medications   olmesartan  (BENICAR ) 5 MG tablet    Sig: Take 2 tablets (10 mg total) by mouth 2 (two) times daily.    Dispense:  180 tablet    Refill:  2    Dose Increase 01/11/24    Patient Instructions  Medication Instructions:  Your physician has recommended you make the following change in your medication:  Please Increase Olmesartan  to 10 Mg Twice daily   Labwork: In 2 weeks at [facility]   Testing/Procedures: None   Follow-Up: Your physician recommends that you schedule a follow-up appointment in: 6 Months  2-3 weeks for a Bloor Pressure check   Any Other Special Instructions Will Be Listed Below (If Applicable).  If you need a refill on your cardiac medications before your next appointment, please call your pharmacy.    Signed, Almarie Crate, NP

## 2024-01-11 NOTE — Patient Instructions (Addendum)
 Medication Instructions:  Your physician has recommended you make the following change in your medication:  Please Increase Olmesartan  to 10 Mg Twice daily   Labwork: In 2 weeks at [facility]   Testing/Procedures: None   Follow-Up: Your physician recommends that you schedule a follow-up appointment in: 6 Months  2-3 weeks for a Bloor Pressure check   Any Other Special Instructions Will Be Listed Below (If Applicable).  If you need a refill on your cardiac medications before your next appointment, please call your pharmacy.

## 2024-01-19 ENCOUNTER — Ambulatory Visit (HOSPITAL_COMMUNITY)
Admit: 2024-01-19 | Discharge: 2024-01-19 | Disposition: A | Discharge status: Home / Self Care | Attending: Internal Medicine | Admitting: Internal Medicine

## 2024-01-19 ENCOUNTER — Ambulatory Visit: Attending: Cardiology

## 2024-01-19 VITALS — BP 148/72 | HR 71 | Ht 63.0 in | Wt 133.0 lb

## 2024-01-19 DIAGNOSIS — I495 Sick sinus syndrome: Secondary | ICD-10-CM

## 2024-01-19 DIAGNOSIS — I4819 Other persistent atrial fibrillation: Secondary | ICD-10-CM

## 2024-01-19 DIAGNOSIS — I480 Paroxysmal atrial fibrillation: Secondary | ICD-10-CM | POA: Diagnosis not present

## 2024-01-19 DIAGNOSIS — D6869 Other thrombophilia: Secondary | ICD-10-CM

## 2024-01-19 NOTE — Progress Notes (Signed)
 Patient brought in to increase LRL from 50 to 60 due to noted dips in HR's on histograms.  Refer to phone note addressing MDT review of transmission.   Patient is doing well, device functioning normally.  Changes made will continue to monitor.   PROGRAMMING CHANGE:  LRL increased to 60bpm.

## 2024-01-19 NOTE — Progress Notes (Signed)
 Primary Care Physician: Marvine Rush, MD Primary Cardiologist: Dr Debera  Primary Electrophysiologist: Dr Inocencio  Referring Physician: Dr Inocencio [name] is a 78 y.o. female with a history of CAD, PAD, HFrEF, HTN, SSS s/p PPM, OSA, atrial flutter, atrial fibrillation who presents for follow up in the [facility]. She was hospitalized December 2017 with atrial fibrillation rapid rates. She was cardioverted to junctional rhythm. She is now status post Medtronic dual-chamber pacemaker. She has been maintained on amiodarone  but has continued to have episodes of rapid afib requiring hospitalization. She is now s/p afib and flutter ablation with Dr Inocencio. Patient is on Eliquis  for a CHADS2VASC score of 6. She did have some oozing from her groin sites and was kept two additional nights post ablation.   On follow up today, patient is s/p DCCV on 09/30/22. Unfortunately, her PPM alerted that she was back out of rhythm 3 days later. She is fatigued, especially with exertion. No bleeding issues on anticoagulation. Of note, her daughter was just diagnosed with lung cancer.   On follow up 01/05/23, she is currently in NSR. Patient is s/p Afib ablation on 12/08/22 by Dr. Inocencio. No episodes of Afib since ablation. Intermittent dizziness since last week; no syncope. Admits to maybe not being consistent with hydration. No chest pain, SOB, or trouble swallowing. Leg sites healed without issue. No missed doses of anticoagulant.  On follow up 07/27/23, she is currently in atrial flutter. Device interrogation on 1/15 showed persistent Afib. She missed a dose of Eliquis  on Sunday night. She feels like she has no energy when out of rhythm.  On follow up 01/19/24, patient is currently in A paced rhythm. S/p Afib ablation on 12/22/23 by Dr. Inocencio. No episodes of Afib since ablation. No chest pain or SOB. Leg sites healed without issue. No missed doses of anticoagulant.  Today,  she denies symptoms of orthopnea, PND, lower extremity edema, dizziness, presyncope, syncope, snoring, daytime somnolence, bleeding, or neurologic sequela. The patient is tolerating medications without difficulties and is otherwise without complaint today.    Atrial Fibrillation Risk Factors:  she does have symptoms or diagnosis of sleep apnea. she is compliant with CPAP therapy. she does not have a history of rheumatic fever.   she has a BMI of Body mass index is 23.56 kg/m.SABRA Filed Weights   01/19/24 1343  Weight: 60.3 kg      Family History  Problem Relation Age of Onset   Heart disease Mother        deceased   Heart disease Father        deceased, heart disease   Diabetes Brother    Heart disease Brother    Thyroid  disease Brother    Heart disease Sister    Heart disease Brother    Thyroid  disease Brother    Lupus Daughter    Colon cancer Neg Hx     Atrial Fibrillation Management history:  Previous antiarrhythmic drugs: amiodarone   Previous cardioversions: 2019, 05/12/22, 09/30/22 Previous ablations: 08/14/22, 12/08/22, 12/22/23 CHADS2VASC score: 6 Anticoagulation history: Eliquis    Past Medical History:  Diagnosis Date   (HFpEF) heart failure with preserved ejection fraction (HCC)    Advanced nonexudative age-related macular degeneration of left eye with subfoveal involvement 06/26/2020   Ongoing, accounts for acuity   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 h/o PAF/flutter during admission in 2013 for  PNA. b. PAF during adm for NSTEMI 07/2015, subsequent paroxysms since then.   B12 deficiency anemia    Branch retinal vein occlusion with macular edema of right eye 10/11/2019   Chronic renal failure, stage 3b (HCC) 09/23/2017   Coronary artery disease 11/30/2014   a. remote MI. b. h/o PTCA with scoring balloon to OM1 11/2014. c. NSTEMI 03/2015 s/p DES to prox-mid Cx. d. NSTEMI 07/2015 s/p scoring  balloon/PTCA/DES to dRCA with PAF during that admission   Cutaneous lupus erythematosus    Early stage nonexudative age-related macular degeneration of right eye 02/27/2020   Essential hypertension    GERD (gastroesophageal reflux disease)    History of blood transfusion 1980's   2nd surgical procedures   Hypercholesteremia    Hypothyroidism    Myocardial infarction (HCC) 02/2012   NSTEMI (non-ST elevated myocardial infarction) (HCC) 04/02/2015   OSA (obstructive sleep apnea) 05/13/2016   Ovarian tumor    PAD (peripheral artery disease) (HCC)    a. s/p LE angio 2015; followed by Dr. Darron - managed medically.   Pericardial effusion    a. 06/2016 after ppm - s/p pericardiocentesis.   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 s/p Medtronic PPM 06/2016, c/b lead perf/pericardial effusion.   TIA (transient ischemic attack)    Type 2 diabetes with nephropathy (HCC) 02/29/2012   Past Surgical History:  Procedure Laterality Date   A-FLUTTER ABLATION N/A 12/08/2022   Procedure: A-FLUTTER ABLATION;  Surgeon: Inocencio Soyla Lunger, MD;  Location: [facility];  Service: Cardiovascular;  Laterality: N/A;   ABDOMINAL AORTAGRAM N/A 01/03/2014   Procedure: ABDOMINAL EZELLA;  Surgeon: Deatrice DELENA Darron, MD;  Location: [facility] CATH LAB;  Service: Cardiovascular;  Laterality: N/A;   ABDOMINAL HYSTERECTOMY  06/22/1970   partial   APPENDECTOMY  02/20/1969   ATRIAL FIBRILLATION ABLATION N/A 08/14/2022   Procedure: ATRIAL FIBRILLATION ABLATION;  Surgeon: Inocencio Soyla Lunger, MD;  Location: [facility];  Service: Cardiovascular;   Laterality: N/A;   ATRIAL FIBRILLATION ABLATION N/A 12/08/2022   Procedure: ATRIAL FIBRILLATION ABLATION;  Surgeon: Inocencio Soyla Lunger, MD;  Location: [facility];  Service: Cardiovascular;  Laterality: N/A;   ATRIAL FIBRILLATION ABLATION N/A 12/22/2023   Procedure: ATRIAL FIBRILLATION ABLATION;  Surgeon: Inocencio Soyla Lunger, MD;  Location: [facility];  Service: Cardiovascular;  Laterality: N/A;   CARDIAC CATHETERIZATION  06/22/2006   Tiny OM-2 with 90% narrowing. Med tx.   CARDIAC CATHETERIZATION N/A 11/30/2014   Procedure: Left Heart Cath and Coronary Angiography;  Surgeon: Debby DELENA Sor, MD; LAD 20%, CFX 50%, OM1 95%, right PLB 30%, LV normal    CARDIAC CATHETERIZATION N/A 11/30/2014   Procedure: Coronary Balloon Angioplasty;  Surgeon: Debby DELENA Sor, MD;  Angiosculpt scoring balloon and PTCA to the OM1 reducing stenosis from 95% to less than 10%   CARDIAC CATHETERIZATION N/A 04/03/2015   Procedure: Left Heart Cath and Coronary Angiography;  Surgeon: Toribio JONELLE Fuel, MD; dLAD 50%, CFX 90%, OM1 100%, PLA 15%, LVEDP 13     CARDIAC CATHETERIZATION N/A 04/03/2015   Procedure: Coronary Stent Intervention;  Surgeon: Ozell Fell, MD; 3.0x18 mm Xience DES  to the CFX     CARDIAC CATHETERIZATION N/A 08/02/2015   Procedure: Left Heart Cath and Coronary Angiography;  Surgeon: Debby DELENA Sor, MD;  Location: [facility] INVASIVE CV LAB;  Service: Cardiovascular;  Laterality: N/A;   CARDIAC CATHETERIZATION N/A 08/02/2015   Procedure: Coronary Stent Intervention;  Surgeon: Debby DELENA Sor, MD;  [facility]: [facility];  Service: Cardiovascular;  Laterality: N/A;   CARDIAC CATHETERIZATION N/A 06/25/2016   Procedure: Pericardiocentesis;  Surgeon: Will Gladis Norton, MD;  Location: [facility];  Service: Cardiovascular;  Laterality: N/A;   cardiac stents     CARDIOVERSION N/A 12/15/2017   Procedure: CARDIOVERSION;  Surgeon: Alveta Aleene PARAS, MD;  Location: [facility] ENDOSCOPY;  Service:  Cardiovascular;  Laterality: N/A;   CARDIOVERSION N/A 05/12/2022   Procedure: CARDIOVERSION;  Surgeon: Shlomo Wilbert SAUNDERS, MD;  Location: [facility] ENDOSCOPY;  Service: Cardiovascular;  Laterality: N/A;   CARDIOVERSION N/A 09/30/2022   Procedure: CARDIOVERSION;  Surgeon: Francyne Headland, MD;  [facility]: [facility];  Service: Cardiovascular;  Laterality: N/A;   CARDIOVERSION N/A 10/29/2022   Procedure: CARDIOVERSION;  Surgeon: Shlomo Wilbert SAUNDERS, MD;  Location: [facility];  Service: Cardiovascular;  Laterality: N/A;   CARDIOVERSION N/A 08/16/2023   Procedure: CARDIOVERSION;  Surgeon: Delford Maude BROCKS, MD;  Location: [facility];  Service: Cardiovascular;  Laterality: N/A;   CHOLECYSTECTOMY OPEN  02/20/1989   COLONOSCOPY  06/23/2003   Dr. Golda: pancolonic divericula, polyp, path unknown currently   COLONOSCOPY  06/22/2010   Dr. Harvey: Normal TI, scattered diverticula in entire colon, small internal hemorrhoids, normal colon biopsies. Colonoscopy in 5-10 years.    COLONOSCOPY WITH PROPOFOL  N/A 06/02/2021   pancolonic diverticulosis. Two 4-6 mm polyps in transverse colon. Sessile serrated and hyperplastic. 5 year surveillance if benefits outweigh the risks.   COLOSTOMY  05/23/1979   COLOSTOMY REVERSAL  11/21/1979   EP IMPLANTABLE DEVICE N/A 06/25/2016   Procedure: Lead Revision/Repair;  Surgeon: Will Gladis Norton, MD;  Location: [facility];  Service: Cardiovascular;  Laterality: N/A;   EP IMPLANTABLE DEVICE N/A 06/25/2016   Procedure: Pacemaker Implant;  Surgeon: Will Gladis Norton, MD;  Location: [facility];  Service: Cardiovascular;  Laterality: N/A;   EXCISIONAL HEMORRHOIDECTOMY  02/20/1969   EYE SURGERY Left 06/22/1998   branch vein occlusion   EYE SURGERY Left ~ 2001   smoothed out wrinkle   LEFT HEART CATH AND CORONARY ANGIOGRAPHY N/A 05/11/2022   Procedure: LEFT HEART CATH AND CORONARY ANGIOGRAPHY;  Surgeon: Verlin Lonni BIRCH, MD;  Location: [facility]  INVASIVE CV LAB;  Service: Cardiovascular;  Laterality: N/A;   LEFT OOPHORECTOMY  05/23/1979   nicked bowel, peritonitis, colostomy; colostomy reversed 1981    LOWER EXTREMITY ANGIOGRAM N/A 01/03/2014   Procedure: LOWER EXTREMITY ANGIOGRAM;  Surgeon: Deatrice DELENA Cage, MD;  Location: [facility] CATH LAB;  Service: Cardiovascular;  Laterality: N/A;   Nuclear med stress test  10/21/2011   Small area of mild ischemia inferoapically.   PARTIAL HYSTERECTOMY  02/20/1969   left ovaries, then ovaries removed later due tumors    POLYPECTOMY  06/02/2021   Procedure: POLYPECTOMY;  Surgeon: Cindie Carlin POUR, DO;  Location: [facility];  Service: Endoscopy;;   right eye surgery  03/2021   RIGHT OOPHORECTOMY  02/20/1969   TEE WITHOUT CARDIOVERSION N/A 05/12/2022   Procedure: TRANSESOPHAGEAL ECHOCARDIOGRAM (TEE);  Surgeon: Shlomo Wilbert SAUNDERS, MD;  Location: [facility] ENDOSCOPY;  Service: Cardiovascular;  Laterality: N/A;   TEE WITHOUT CARDIOVERSION N/A 08/14/2022  Procedure: TRANSESOPHAGEAL ECHOCARDIOGRAM;  Surgeon: Inocencio Soyla Lunger, MD;  Location: [facility] INVASIVE CV LAB;  Service: Cardiovascular;  Laterality: N/A;   TEE WITHOUT CARDIOVERSION N/A 12/08/2022   Procedure: TRANSESOPHAGEAL ECHOCARDIOGRAM;  Surgeon: Inocencio Soyla Lunger, MD;  Location: [facility] INVASIVE CV LAB;  Service: Cardiovascular;  Laterality: N/A;   TRANSESOPHAGEAL ECHOCARDIOGRAM (CATH LAB) N/A 12/22/2023   Procedure: TRANSESOPHAGEAL ECHOCARDIOGRAM;  Surgeon: Inocencio Soyla Lunger, MD;  Location: [facility] INVASIVE CV LAB;  Service: Cardiovascular;  Laterality: N/A;    Current Outpatient Medications  Medication Sig Dispense Refill   acetaminophen  (TYLENOL ) 500 MG tablet Take 1,000 mg by mouth every 8 (eight) hours as needed for headache, moderate pain or mild pain. (Patient taking differently: Take 1,000 mg by mouth as needed for headache, moderate pain (pain score 4-6) or mild pain (pain score 1-3).)     ALPRAZolam  (XANAX ) 0.25 MG tablet Take 1-2 tablets (0.25-0.5 mg  total) by mouth at bedtime as needed for sleep (neuropathy). 60 tablet 0   apixaban  (ELIQUIS ) 5 MG TABS tablet Take 5 mg by mouth 2 (two) times daily.     atorvastatin  (LIPITOR ) 80 MG tablet TAKE ONE TABLET BY MOUTH EVERY DAY AT 6:00PM 90 tablet 3   calcitRIOL  (ROCALTROL ) 0.25 MCG capsule Take 0.25 mcg by mouth every Monday, Wednesday, and Friday.     carvedilol  (COREG ) 25 MG tablet Take 1 tablet (25 mg total) by mouth 2 (two) times daily. 60 tablet 3   Cholecalciferol  50 MCG (2000 UT) TABS Take 2,000 Units by mouth at bedtime.     clopidogrel  (PLAVIX ) 75 MG tablet TAKE ONE TABLET (75MG  TOTAL) BY MOUTH DAILY 90 tablet 3   Continuous Blood Gluc Receiver (FREESTYLE LIBRE 2 READER) DEVI 1 each by Does not apply route daily. 1 each 0   Continuous Glucose Sensor (FREESTYLE LIBRE 2 PLUS SENSOR) MISC Inject 1 each into the skin as directed. Change sensor every 15 days 6 each 3   cyanocobalamin  2000 MCG tablet Take 2,000 mcg by mouth daily.     diltiazem  (CARDIZEM  CD) 180 MG 24 hr capsule Take 1 capsule (180 mg total) by mouth daily. 30 capsule 3   furosemide  (LASIX ) 40 MG tablet Take 40 mg by mouth daily as needed for fluid or edema. (Patient taking differently: Take 40 mg by mouth as needed for fluid or edema.)     glucose blood (FREESTYLE PRECISION NEO TEST) test strip Use as instructed 100 each 3   insulin  glargine (LANTUS  SOLOSTAR) 100 UNIT/ML Solostar Pen Inject 20 Units into the skin daily. 15 mL 2   Insulin  Lispro-aabc (LYUMJEV  KWIKPEN) 100 UNIT/ML KwikPen Inject 4-6 units under skin before the 2 main meals of the day 15 mL 3   levothyroxine  (SYNTHROID ) 112 MCG tablet TAKE ONE TABLET BY MOUTH ONCE DAILY 90 tablet 3   nitroGLYCERIN  (NITROSTAT ) 0.4 MG SL tablet Place 1 tablet (0.4 mg total) under the tongue every 5 (five) minutes x 3 doses as needed for chest pain (If not relief after 3rd dose, call 911 or go to ED). 25 tablet 0   olmesartan  (BENICAR ) 5 MG tablet Take 2 tablets (10 mg total) by mouth  2 (two) times daily. 180 tablet 2   rOPINIRole  (REQUIP ) 0.5 MG tablet Take 1 tablet (0.5 mg total) by mouth at bedtime. 30 tablet 2   Semaglutide , 1 MG/DOSE, (OZEMPIC , 1 MG/DOSE,) 4 MG/3ML SOPN Inject 1 mg into the skin once a week. 3 mL 2   No current facility-administered medications for this encounter.  Allergies  Allergen Reactions   Penicillins Hives   Percocet [Oxycodone-Acetaminophen ] Nausea And Vomiting    ROS- All systems are reviewed and negative except as per the HPI above.  Physical Exam: Vitals:   01/19/24 1343  BP: (!) 148/72  Pulse: 71  Weight: 60.3 kg  Height: 5' 3 (1.6 m)    GEN- The patient is well appearing, alert and oriented x 3 today.   Neck - no JVD or carotid bruit noted Lungs- Clear to ausculation bilaterally, normal work of breathing Heart- Regular rate and rhythm, no murmurs, rubs or gallops, PMI not laterally displaced Extremities- no clubbing, cyanosis, or edema Skin - no rash or ecchymosis noted  Wt Readings from Last 3 Encounters:  01/19/24 60.3 kg  01/11/24 58.2 kg  12/22/23 59.9 kg    EKG today demonstrates  Vent. rate 71 BPM PR interval 328 ms QRS duration 126 ms QT/QTcB 430/467 ms P-R-T axes 94 -49 142 Atrial-paced rhythm with prolonged AV conduction Left axis deviation Left ventricular hypertrophy with QRS widening and repolarization abnormality ( R in aVL , Cornell product , Romhilt-Estes ) Abnormal ECG When compared with ECG of 22-Dec-2023 10:38, Electronic atrial pacemaker has replaced Sinus rhythm  ECHO 04/14/23: 1. Left ventricular ejection fraction, by estimation, is 45 to 50%. The  left ventricle has mildly decreased function. The left ventricle  demonstrates global hypokinesis. The left ventricular internal cavity size  was mildly dilated. There is mild  concentric left ventricular hypertrophy. Left ventricular diastolic  parameters are consistent with Grade II diastolic dysfunction  (pseudonormalization).   2.  Right ventricular systolic function is normal. The right ventricular  size is normal. Tricuspid regurgitation signal is inadequate for assessing  PA pressure.   3. Left atrial size was moderately dilated.   4. The mitral valve is degenerative. Trivial mitral valve regurgitation.  Moderate mitral annular calcification.   5. The aortic valve is tricuspid. There is mild calcification of the  aortic valve. Aortic valve regurgitation is not visualized. Aortic valve  sclerosis/calcification is present, without any evidence of aortic  stenosis.   6. The inferior vena cava is normal in size with greater than 50%  respiratory variability, suggesting right atrial pressure of 3 mmHg.   Comparison(s): Prior images reviewed side by side. LVEF has improved, now  in the range of 45-50%.   Epic records are reviewed at length today  CHA2DS2-VASc Score = 6  The patient's score is based upon: CHF History: 1 HTN History: 1 Diabetes History: 0 Stroke History: 0 Vascular Disease History: 1 Age Score: 2 Gender Score: 1       ASSESSMENT AND PLAN: 1. Persistent Atrial Fibrillation/atrial flutter The patient's CHA2DS2-VASc score is 6, indicating a 9.7% annual risk of stroke.   S/p afib and flutter ablation 08/14/22 S/p DCCV 09/30/22 with quick return of atrial flutter. S/p Afib ablation on 12/08/22. Amiodarone  stopped due to tremors.  S/p Afib ablation on 12/22/23 by Dr. Inocencio.  Patient is currently in A paced rhythm. Continue diltiazem  180 mg daily.  2. Secondary Hypercoagulable State (ICD10:  D68.69) The patient is at significant risk for stroke/thromboembolism based upon her CHA2DS2-VASc Score of 6.  Continue Apixaban  (Eliquis ).  No missed doses.   3. Chronic systolic CHF EF 45-50%. She appears euvolemic today.   4. Obstructive sleep apnea She uses her CPAP regularly.   5. SSS S/p PPM, followed by Dr Inocencio and the device clinic.  6. CAD No chest pain.   Follow up as scheduled  with  EP.   Virginia Heinrich, PA-C [facility] [address] [place], [place] [address] [phone] 01/19/2024 2:14 PM

## 2024-01-20 DIAGNOSIS — H348122 Central retinal vein occlusion, left eye, stable: Secondary | ICD-10-CM | POA: Diagnosis not present

## 2024-01-20 DIAGNOSIS — H353124 Nonexudative age-related macular degeneration, left eye, advanced atrophic with subfoveal involvement: Secondary | ICD-10-CM | POA: Diagnosis not present

## 2024-01-20 DIAGNOSIS — H353111 Nonexudative age-related macular degeneration, right eye, early dry stage: Secondary | ICD-10-CM | POA: Diagnosis not present

## 2024-01-20 DIAGNOSIS — E113411 Type 2 diabetes mellitus with severe nonproliferative diabetic retinopathy with macular edema, right eye: Secondary | ICD-10-CM | POA: Diagnosis not present

## 2024-01-20 DIAGNOSIS — H43391 Other vitreous opacities, right eye: Secondary | ICD-10-CM | POA: Diagnosis not present

## 2024-01-20 DIAGNOSIS — H35041 Retinal micro-aneurysms, unspecified, right eye: Secondary | ICD-10-CM | POA: Diagnosis not present

## 2024-01-20 DIAGNOSIS — H34831 Tributary (branch) retinal vein occlusion, right eye, with macular edema: Secondary | ICD-10-CM | POA: Diagnosis not present

## 2024-01-22 ENCOUNTER — Ambulatory Visit (INDEPENDENT_AMBULATORY_CARE_PROVIDER_SITE_OTHER)

## 2024-01-22 ENCOUNTER — Ambulatory Visit
Admission: EM | Admit: 2024-01-22 | Discharge: 2024-01-22 | Disposition: A | Discharge status: Home / Self Care | Attending: Nurse Practitioner | Admitting: Nurse Practitioner

## 2024-01-22 DIAGNOSIS — M25561 Pain in right knee: Secondary | ICD-10-CM | POA: Diagnosis not present

## 2024-01-22 DIAGNOSIS — M7989 Other specified soft tissue disorders: Secondary | ICD-10-CM | POA: Diagnosis not present

## 2024-01-22 DIAGNOSIS — M1711 Unilateral primary osteoarthritis, right knee: Secondary | ICD-10-CM | POA: Diagnosis not present

## 2024-01-22 MED ORDER — TRAMADOL HCL 50 MG PO TABS: 50.0000 mg | ORAL_TABLET | Freq: Two times a day (BID) | ORAL | 0 refills | Status: AC | PRN

## 2024-01-22 NOTE — ED Provider Notes (Signed)
 [facility]    CSN: [id number] Arrival date & time: 01/22/24  1426      History   Chief Complaint Chief Complaint  Patient presents with   right knee pain    HPI Virginia Huber is a 78 y.o. female.   The history is provided by the patient.   Patient presents for complaints of right knee pain.  Patient states she stood from her chair and felt a pop in her knee, states that after she stood, she felt pain radiating from the area above her knee to the back of the knee.  She states that since that time, she has swelling to the knee along with decreased range of motion.  Patient denies fall, injury, trauma, or radiation of pain.  Patient states that she is unable to stand on the knee without difficulty.  Patient states that she did ice the knee after the symptoms started.  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 h/o PAF/flutter during admission in 2013 for PNA. b. PAF during adm for NSTEMI 07/2015, subsequent paroxysms since then.   B12 deficiency anemia    Branch retinal vein occlusion with macular edema of right eye 10/11/2019   Chronic renal failure, stage 3b (HCC) 09/23/2017   Coronary artery disease 11/30/2014   a. remote MI. b. h/o PTCA with scoring balloon to OM1 11/2014. c. NSTEMI 03/2015 s/p DES to prox-mid Cx. d. 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 s/p LE angio 2015; followed by Dr. Darron - managed medically.   Pericardial effusion    a. 06/2016 after ppm - s/p pericardiocentesis.   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 s/p Medtronic PPM 06/2016, c/b lead perf/pericardial effusion.   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) 08/25/2023   Diastolic heart failure (HCC) 11/03/2022   Hypercoagulable state due to persistent atrial fibrillation (HCC) 09/14/2022   Bleeding at insertion site 08/15/2022   Atrial fibrillation (HCC) 08/14/2022   Angina at rest [facility]) 05/09/2022   Chronic heart failure with preserved ejection fraction (HFpEF) (HCC) 05/09/2022   Stable hemispheric branch retinal vein occlusion (BRVO) of left eye 12/08/2021  History of vitrectomy 12/08/2021   History of colonic polyps 05/13/2021   Vitreous membranes and strands, right 01/21/2021   Advanced nonexudative age-related macular degeneration of left eye with subfoveal involvement 06/26/2020   Stable central retinal vein occlusion of left eye 05/13/2020   Early stage nonexudative age-related macular degeneration of right eye 02/27/2020   OSA on CPAP 02/12/2020   Branch retinal vein occlusion with macular edema of right  eye 10/11/2019   Right retinal defect 10/11/2019   Retinal microaneurysm of right eye 10/11/2019   CHF exacerbation (HCC) 08/25/2019   Acute exacerbation of CHF (congestive heart failure) (HCC) 08/24/2019   Acute on chronic diastolic HF (heart failure) (HCC) 12/17/2017   GERD (gastroesophageal reflux disease) 12/17/2017   Typical atrial flutter (HCC)    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   Paroxysmal atrial fibrillation (HCC) 12/08/2017   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   Chronic renal failure, stage 3b (HCC) 09/23/2017   Fever 09/23/2017   S/P pericardiocentesis 06/28/2016   Acute blood loss anemia 06/28/2016   Pericardial effusion 06/26/2016   Tachy-brady syndrome (HCC) 06/25/2016   Tamponade    Bradycardia 06/14/2016   Junctional bradycardia    Coronary artery disease involving coronary bypass graft of native heart with unstable angina pectoris (HCC)    Acute diastolic CHF (congestive heart failure), NYHA class 3 (HCC)    Systolic congestive heart failure (HCC) 05/13/2016   Multiple and bilateral precerebral artery syndromes 05/13/2016   OSA (obstructive sleep apnea) 05/13/2016   Chronic diarrhea 12/25/2015   Persistent atrial fibrillation (HCC) 09/17/2015   Chest pain 08/02/2015   Atrial fibrillation with rapid ventricular response (HCC)    Pain with urination 05/08/2015   NSTEMI (non-ST elevated myocardial infarction) (HCC) 04/02/2015   CAD in native artery 11/30/2014   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    PAD (peripheral artery disease) (HCC) 12/26/2013   Superficial fungus infection of skin 06/29/2013   UTI (urinary tract infection) 05/08/2013   Hypokalemia 03/05/2012   B12 deficiency anemia 03/02/2012   Bronchospasm 03/02/2012   Community acquired bacterial pneumonia 03/01/2012   Acute respiratory failure with hypoxia (HCC) 03/01/2012   Type 2 diabetes with  nephropathy (HCC) 02/29/2012   Hypothyroidism 02/28/2012   RLQ abdominal pain 11/24/2010   Overweight 06/03/2010   Essential hypertension 06/03/2010   Overweight 06/03/2010   Mixed hyperlipidemia 12/27/2009   Palpitations 05/17/2009   FRACTURE, TOE 12/06/2007    Past Surgical History:  Procedure Laterality Date   A-FLUTTER ABLATION N/A 12/08/2022   Procedure: A-FLUTTER ABLATION;  Surgeon: Inocencio Soyla Lunger, MD;  Location: [facility];  Service: Cardiovascular;  Laterality: N/A;   ABDOMINAL AORTAGRAM N/A 01/03/2014   Procedure: ABDOMINAL EZELLA;  Surgeon: Deatrice DELENA Cage, MD;  Location: [facility];  Service: Cardiovascular;  Laterality: N/A;   ABDOMINAL HYSTERECTOMY  06/22/1970   partial   APPENDECTOMY  02/20/1969   ATRIAL FIBRILLATION ABLATION N/A 08/14/2022   Procedure: ATRIAL FIBRILLATION ABLATION;  Surgeon: Inocencio Soyla Lunger, MD;  Location: [facility];  Service: Cardiovascular;  Laterality: N/A;   ATRIAL FIBRILLATION ABLATION N/A 12/08/2022   Procedure: ATRIAL FIBRILLATION ABLATION;  Surgeon: Inocencio Soyla Lunger, MD;  Location: [facility];  Service: Cardiovascular;  Laterality: N/A;   ATRIAL FIBRILLATION ABLATION N/A 12/22/2023   Procedure: ATRIAL FIBRILLATION ABLATION;  Surgeon: Inocencio Soyla Lunger, MD;  Location: [facility];  Service: Cardiovascular;  Laterality: N/A;  CARDIAC CATHETERIZATION  06/22/2006   Tiny OM-2 with 90% narrowing. Med tx.   CARDIAC CATHETERIZATION N/A 11/30/2014   Procedure: Left Heart Cath and Coronary Angiography;  Surgeon: Debby DELENA Sor, MD; LAD 20%, CFX 50%, OM1 95%, right PLB 30%, LV normal    CARDIAC CATHETERIZATION N/A 11/30/2014   Procedure: Coronary Balloon Angioplasty;  Surgeon: Debby DELENA Sor, MD;  Angiosculpt scoring balloon and PTCA to the OM1 reducing stenosis from 95% to less than 10%   CARDIAC CATHETERIZATION N/A 04/03/2015   Procedure: Left Heart Cath and Coronary Angiography;  Surgeon: Toribio JONELLE Fuel, MD; dLAD 50%, CFX 90%, OM1 100%, PLA 15%, LVEDP 13     CARDIAC CATHETERIZATION N/A 04/03/2015   Procedure: Coronary Stent Intervention;  Surgeon: Ozell Fell, MD; 3.0x18 mm Xience DES to the CFX     CARDIAC CATHETERIZATION N/A 08/02/2015   Procedure: Left Heart Cath and Coronary Angiography;  Surgeon: Debby DELENA Sor, MD;  Location: [facility] INVASIVE CV LAB;  Service: Cardiovascular;  Laterality: N/A;   CARDIAC CATHETERIZATION N/A 08/02/2015   Procedure: Coronary Stent Intervention;  Surgeon: Debby DELENA Sor, MD;  [facility];  Service: Cardiovascular;  Laterality: N/A;   CARDIAC CATHETERIZATION N/A 06/25/2016   Procedure: Pericardiocentesis;  Surgeon: Will Gladis Norton, MD;  Location: [facility];  Service: Cardiovascular;  Laterality: N/A;   cardiac stents     CARDIOVERSION N/A 12/15/2017   Procedure: CARDIOVERSION;  Surgeon: Alveta Aleene PARAS, MD;  Location: [facility] ENDOSCOPY;  Service: Cardiovascular;  Laterality: N/A;   CARDIOVERSION N/A 05/12/2022   Procedure: CARDIOVERSION;  Surgeon: Shlomo Wilbert JONELLE, MD;  Location: [facility] ENDOSCOPY;  Service: Cardiovascular;  Laterality: N/A;   CARDIOVERSION N/A 09/30/2022   Procedure: CARDIOVERSION;  Surgeon: Francyne Headland, MD;  [facility]: [facility];  Service: Cardiovascular;  Laterality: N/A;   CARDIOVERSION N/A 10/29/2022   Procedure: CARDIOVERSION;  Surgeon: Shlomo Wilbert JONELLE, MD;  Location: [facility];  Service: Cardiovascular;  Laterality: N/A;   CARDIOVERSION N/A 08/16/2023   Procedure: CARDIOVERSION;  Surgeon: Delford Maude BROCKS, MD;  Location: [facility];  Service: Cardiovascular;  Laterality: N/A;   CHOLECYSTECTOMY OPEN  02/20/1989   COLONOSCOPY  06/23/2003   Dr. Golda: pancolonic divericula, polyp, path unknown currently   COLONOSCOPY  06/22/2010   Dr. Harvey: Normal TI, scattered diverticula in entire colon, small internal hemorrhoids, normal colon biopsies. Colonoscopy in 5-10 years.    COLONOSCOPY  WITH PROPOFOL  N/A 06/02/2021   pancolonic diverticulosis. Two 4-6 mm polyps in transverse colon. Sessile serrated and hyperplastic. 5 year surveillance if benefits outweigh the risks.   COLOSTOMY  05/23/1979   COLOSTOMY REVERSAL  11/21/1979   EP IMPLANTABLE DEVICE N/A 06/25/2016   Procedure: Lead Revision/Repair;  Surgeon: Will Gladis Norton, MD;  Location: [facility];  Service: Cardiovascular;  Laterality: N/A;   EP IMPLANTABLE DEVICE N/A 06/25/2016   Procedure: Pacemaker Implant;  Surgeon: Will Gladis Norton, MD;  Location: [facility];  Service: Cardiovascular;  Laterality: N/A;   EXCISIONAL HEMORRHOIDECTOMY  02/20/1969   EYE SURGERY Left 06/22/1998   branch vein occlusion   EYE SURGERY Left ~ 2001   smoothed out wrinkle   LEFT HEART CATH AND CORONARY ANGIOGRAPHY N/A 05/11/2022   Procedure: LEFT HEART CATH AND CORONARY ANGIOGRAPHY;  Surgeon: Verlin Lonni BIRCH, MD;  Location: [facility];  Service: Cardiovascular;  Laterality: N/A;   LEFT OOPHORECTOMY  05/23/1979   nicked bowel, peritonitis, colostomy; colostomy reversed 1981  LOWER EXTREMITY ANGIOGRAM N/A 01/03/2014   Procedure: LOWER EXTREMITY ANGIOGRAM;  Surgeon: Deatrice DELENA Cage, MD;  Location: [facility];  Service: Cardiovascular;  Laterality: N/A;   Nuclear med stress test  10/21/2011   Small area of mild ischemia inferoapically.   PARTIAL HYSTERECTOMY  02/20/1969   left ovaries, then ovaries removed later due tumors    POLYPECTOMY  06/02/2021   Procedure: POLYPECTOMY;  Surgeon: Cindie Carlin POUR, DO;  Location: [facility];  Service: Endoscopy;;   right eye surgery  03/2021   RIGHT OOPHORECTOMY  02/20/1969   TEE WITHOUT CARDIOVERSION N/A 05/12/2022   Procedure: TRANSESOPHAGEAL ECHOCARDIOGRAM (TEE);  Surgeon: Shlomo Wilbert SAUNDERS, MD;  Location: [facility] ENDOSCOPY;  Service: Cardiovascular;  Laterality: N/A;   TEE WITHOUT CARDIOVERSION N/A 08/14/2022   Procedure: TRANSESOPHAGEAL ECHOCARDIOGRAM;   Surgeon: Inocencio Soyla Lunger, MD;  Location: [facility] INVASIVE CV LAB;  Service: Cardiovascular;  Laterality: N/A;   TEE WITHOUT CARDIOVERSION N/A 12/08/2022   Procedure: TRANSESOPHAGEAL ECHOCARDIOGRAM;  Surgeon: Inocencio Soyla Lunger, MD;  Location: [facility] INVASIVE CV LAB;  Service: Cardiovascular;  Laterality: N/A;   TRANSESOPHAGEAL ECHOCARDIOGRAM (CATH LAB) N/A 12/22/2023   Procedure: TRANSESOPHAGEAL ECHOCARDIOGRAM;  Surgeon: Inocencio Soyla Lunger, MD;  Location: [facility] INVASIVE CV LAB;  Service: Cardiovascular;  Laterality: N/A;    OB History     Gravida  3   Para  2   Term      Preterm      AB  1   Living  2      SAB  1   IAB      Ectopic      Multiple      Live Births  2            Home Medications    Prior to Admission medications   Medication Sig Start Date End Date Taking? Authorizing Provider  traMADol  (ULTRAM ) 50 MG tablet Take 1 tablet (50 mg total) by mouth every 12 (twelve) hours as needed for severe pain (pain score 7-10). 01/22/24  Yes Leath-Warren, Etta PARAS, NP  acetaminophen  (TYLENOL ) 500 MG tablet Take 1,000 mg by mouth every 8 (eight) hours as needed for headache, moderate pain or mild pain. Patient taking differently: Take 1,000 mg by mouth as needed for headache, moderate pain (pain score 4-6) or mild pain (pain score 1-3).    [provider]  ALPRAZolam  (XANAX ) 0.25 MG tablet Take 1-2 tablets (0.25-0.5 mg total) by mouth at bedtime as needed for sleep (neuropathy). 06/24/23   Dohmeier, Dedra, MD  apixaban  (ELIQUIS ) 5 MG TABS tablet Take 5 mg by mouth 2 (two) times daily.    [provider]  atorvastatin  (LIPITOR ) 80 MG tablet TAKE ONE TABLET BY MOUTH EVERY DAY AT 6:00PM 10/06/18   Charls Pearla DELENA, MD  calcitRIOL  (ROCALTROL ) 0.25 MCG capsule Take 0.25 mcg by mouth every Monday, Wednesday, and Friday. 12/24/21   [provider]  carvedilol  (COREG ) 25 MG tablet Take 1 tablet (25 mg total) by mouth 2 (two) times daily. 10/04/23   Debera Jayson MATSU, MD  Cholecalciferol  50 MCG (2000 UT) TABS Take 2,000 Units by mouth at bedtime.    [provider]  clopidogrel  (PLAVIX ) 75 MG tablet TAKE ONE TABLET (75MG  TOTAL) BY MOUTH DAILY 03/20/19   Charls Pearla DELENA, MD  Continuous Blood Gluc Receiver (FREESTYLE LIBRE 2 READER) DEVI 1 each by Does not apply route daily. 06/24/20   Trixie File, MD  Continuous Glucose Sensor (FREESTYLE LIBRE 2 PLUS SENSOR) MISC  Inject 1 each into the skin as directed. Change sensor every 15 days 12/29/23   Trixie File, MD  cyanocobalamin  2000 MCG tablet Take 2,000 mcg by mouth daily.    [provider]  diltiazem  (CARDIZEM  CD) 180 MG 24 hr capsule Take 1 capsule (180 mg total) by mouth daily. 12/10/23   Camnitz, Soyla Lunger, MD  furosemide  (LASIX ) 40 MG tablet Take 40 mg by mouth daily as needed for fluid or edema. Patient taking differently: Take 40 mg by mouth as needed for fluid or edema.    [provider]  glucose blood (FREESTYLE PRECISION NEO TEST) test strip Use as instructed 05/23/21   Trixie File, MD  insulin  glargine (LANTUS  SOLOSTAR) 100 UNIT/ML Solostar Pen Inject 20 Units into the skin daily. 08/06/23   Trixie File, MD  Insulin  Lispro-aabc (LYUMJEV  KWIKPEN) 100 UNIT/ML KwikPen Inject 4-6 units under skin before the 2 main meals of the day 08/04/23   Trixie File, MD  levothyroxine  (SYNTHROID ) 112 MCG tablet TAKE ONE TABLET BY MOUTH ONCE DAILY 12/03/23   Trixie File, MD  nitroGLYCERIN  (NITROSTAT ) 0.4 MG SL tablet Place 1 tablet (0.4 mg total) under the tongue every 5 (five) minutes x 3 doses as needed for chest pain (If not relief after 3rd dose, call 911 or go to ED). 05/18/22   Miriam Norris, NP  olmesartan  (BENICAR ) 5 MG tablet Take 2 tablets (10 mg total) by mouth 2 (two) times daily. 01/11/24   Miriam Norris, NP  rOPINIRole  (REQUIP ) 0.5 MG tablet Take 1 tablet (0.5 mg total) by mouth at bedtime. 11/10/23   Dohmeier, Dedra, MD  Semaglutide , 1  MG/DOSE, (OZEMPIC , 1 MG/DOSE,) 4 MG/3ML SOPN Inject 1 mg into the skin once a week. 02/17/23   Trixie File, MD    Family History Family History  Problem Relation Age of Onset   Heart disease Mother        deceased   Heart disease Father        deceased, heart disease   Diabetes Brother    Heart disease Brother    Thyroid  disease Brother    Heart disease Sister    Heart disease Brother    Thyroid  disease Brother    Lupus Daughter    Colon cancer Neg Hx     Social History Social History   Tobacco Use   Smoking status: Never    Passive exposure: Never   Smokeless tobacco: Never   Tobacco comments:    Never smoke 10/08/22  Vaping Use   Vaping status: Never Used  Substance Use Topics   Alcohol use: No    Alcohol/week: 0.0 standard drinks of alcohol   Drug use: No     Allergies   Penicillins and Percocet [oxycodone-acetaminophen ]   Review of Systems Review of Systems Per HPI  Physical Exam Triage Vital Signs ED Triage Vitals  Encounter Vitals Group     BP 01/22/24 1436 (!) 155/74     Girls Systolic BP Percentile --      Girls Diastolic BP Percentile --      Boys Systolic BP Percentile --      Boys Diastolic BP Percentile --      Pulse Rate 01/22/24 1436 80     Resp 01/22/24 1436 18     Temp 01/22/24 1436 97.9 F (36.6 C)     Temp Source 01/22/24 1436 Oral     SpO2 01/22/24 1436 95 %     Weight --  Height --      Head Circumference --      Peak Flow --      Pain Score 01/22/24 1439 8     Pain Loc --      Pain Education --      Exclude from Growth Chart --    No data found.  Updated Vital Signs BP (!) 155/74 (BP Location: Right Arm)   Pulse 80   Temp 97.9 F (36.6 C) (Oral)   Resp 18   SpO2 95%   Visual Acuity Right Eye Distance:   Left Eye Distance:   Bilateral Distance:    Right Eye Near:   Left Eye Near:    Bilateral Near:     Physical Exam Vitals and nursing note reviewed.  Constitutional:      General: She is not in  acute distress.    Appearance: Normal appearance.  HENT:     Head: Normocephalic.  Eyes:     Pupils: Pupils are equal, round, and reactive to light.  Pulmonary:     Effort: Pulmonary effort is normal.  Musculoskeletal:     Cervical back: Normal range of motion.     Right knee: Swelling present. No deformity or ecchymosis. Decreased range of motion. Tenderness present over the medial joint line. Normal pulse.  Skin:    General: Skin is warm and dry.  Neurological:     General: No focal deficit present.     Mental Status: She is alert and oriented to person, place, and time.  Psychiatric:        Mood and Affect: Mood normal.        Behavior: Behavior normal.      UC Treatments / Results  Labs (all labs ordered are listed, but only abnormal results are displayed) Labs Reviewed - No data to display  EKG   Radiology DG Knee Complete 4 Views Right Result Date: 01/22/2024 CLINICAL DATA:  Right knee pain and swelling after hearing popping today. EXAM: RIGHT KNEE - COMPLETE 4+ VIEW COMPARISON:  None Available. FINDINGS: Diffuse decreased bone mineralization. Mild tricompartmental osteoarthritis. No acute fracture or dislocation. No significant joint effusion. Peripheral atherosclerotic disease involving the femoral, popliteal and runoff lower leg vessels. IMPRESSION: 1. No acute findings. 2. Mild tricompartmental osteoarthritis. Electronically Signed   By: Toribio Agreste M.D.   On: 01/22/2024 15:03    Procedures Procedures (including critical care time)  Medications Ordered in UC Medications - No data to display  Initial Impression / Assessment and Plan / UC Course  I have reviewed the triage vital signs and the nursing notes.  Pertinent labs & imaging results that were available during my care of the patient were reviewed by me and considered in my medical decision making (see chart for details).  On exam, patient has tenderness to the quadricep tendon of the right knee.  She  also has generalized tenderness to the medial and lateral aspects of the knee and behind the knee.  There is no obvious bruising or deformity present.  Patient is unable to straighten the knee to 180 degree angle.  X-ray of the right knee with results showing mild tricompartmental osteoarthritis.  Cannot rule out tendon injury given the patient's symptoms.  Knee brace was provided to allow for additional compression and support.  Supportive care recommendations were provided discussed with the patient to include RICE therapy, and over-the-counter analgesics.  Patient was advised that she will need to follow-up with orthopedics on 01/24/2024 for further evaluation.  Patient states she will follow-up with orthopedist of her choice.  Patient was in agreement with this plan of care and verbalizes understanding.  All questions were answered.  Patient stable for discharge.  Final Clinical Impressions(s) / UC Diagnoses   Final diagnoses:  Right knee pain, unspecified chronicity     Discharge Instructions      The x-ray of the right knee was negative for fracture or dislocation. A brace has been provided to allow for additional compression and support.  Use the brace when you are engaged in prolonged or strenuous activity. Weightbearing as tolerated. RICE therapy, rest, ice, compression, and elevation.  Apply ice for 20 minutes, remove for 1 hour, repeat as needed. You may take over-the-counter Tylenol  as needed for pain or discomfort.  I have also provided prescription for tramadol  to help with pain not controlled by Tylenol . As discussed, would like for you to follow-up with orthopedics on 01/24/2024.  Please call Beverley Millman to schedule an appointment. Follow-up as needed.      ED Prescriptions     Medication Sig Dispense Auth. Provider   traMADol  (ULTRAM ) 50 MG tablet Take 1 tablet (50 mg total) by mouth every 12 (twelve) hours as needed for severe pain (pain score 7-10). 10 tablet Leath-Warren,  Etta PARAS, NP      I have reviewed the PDMP during this encounter.   Gilmer Etta PARAS, NP 01/22/24 1520

## 2024-01-22 NOTE — ED Triage Notes (Signed)
 Pt reports she stood from her chair her right knee felt like it popped earlier today. States the pain radiates to the back of her knee cap as well.  Pt knee is swollen and painful

## 2024-01-22 NOTE — Discharge Instructions (Signed)
 The x-ray of the right knee was negative for fracture or dislocation. A brace has been provided to allow for additional compression and support.  Use the brace when you are engaged in prolonged or strenuous activity. Weightbearing as tolerated. RICE therapy, rest, ice, compression, and elevation.  Apply ice for 20 minutes, remove for 1 hour, repeat as needed. You may take over-the-counter Tylenol  as needed for pain or discomfort.  I have also provided prescription for tramadol  to help with pain not controlled by Tylenol . As discussed, would like for you to follow-up with orthopedics on 01/24/2024.  Please call Beverley Millman to schedule an appointment. Follow-up as needed.

## 2024-01-24 ENCOUNTER — Encounter (HOSPITAL_COMMUNITY): Payer: Self-pay | Admitting: Specialist

## 2024-01-24 ENCOUNTER — Ambulatory Visit (HOSPITAL_COMMUNITY)
Admission: RE | Admit: 2024-01-24 | Discharge: 2024-01-24 | Disposition: A | Discharge status: Home / Self Care | Source: Ambulatory Visit | Attending: Orthopedic Surgery | Admitting: Orthopedic Surgery

## 2024-01-24 ENCOUNTER — Encounter (HOSPITAL_COMMUNITY): Payer: Self-pay

## 2024-01-24 ENCOUNTER — Telehealth: Payer: Self-pay | Admitting: Cardiology

## 2024-01-24 ENCOUNTER — Other Ambulatory Visit (HOSPITAL_COMMUNITY): Payer: Self-pay | Admitting: Orthopedic Surgery

## 2024-01-24 DIAGNOSIS — M660 Rupture of popliteal cyst: Secondary | ICD-10-CM | POA: Diagnosis not present

## 2024-01-24 DIAGNOSIS — M25561 Pain in right knee: Secondary | ICD-10-CM | POA: Diagnosis not present

## 2024-01-24 DIAGNOSIS — R609 Edema, unspecified: Secondary | ICD-10-CM | POA: Diagnosis not present

## 2024-01-24 DIAGNOSIS — M2241 Chondromalacia patellae, right knee: Secondary | ICD-10-CM | POA: Diagnosis not present

## 2024-01-24 DIAGNOSIS — M25461 Effusion, right knee: Secondary | ICD-10-CM | POA: Diagnosis not present

## 2024-01-24 DIAGNOSIS — S83241A Other tear of medial meniscus, current injury, right knee, initial encounter: Secondary | ICD-10-CM | POA: Diagnosis not present

## 2024-01-24 DIAGNOSIS — S83249A Other tear of medial meniscus, current injury, unspecified knee, initial encounter: Secondary | ICD-10-CM

## 2024-01-24 NOTE — Progress Notes (Signed)
 Acute medial meniscus tear of right knee, initial encounter

## 2024-01-24 NOTE — Telephone Encounter (Signed)
 Patient needs to r/s her nurse visit. Please advise

## 2024-01-24 NOTE — CV Procedure (Signed)
  Device system confirmed  , to be MRI conditional, with implant date > 6 weeks ago, and no evidence of abandoned or epicardial leads in review of most recent CXR  Device last cleared by EP Provider: Prentice Passey  PA on 01/24/24  Clearance is good through for 1 year as long as parameters remain stable at time of check. If pt undergoes a cardiac device procedure during that time, they should be re-cleared.   Tachy-therapies to be programmed off if applicable with device back to pre-MRI settings after completion of exam.  [employer] - Programming recommendation received through [employer] App/Tablet  Tobias LITTIE Leander, RT  01/24/2024 3:06 PM

## 2024-01-26 ENCOUNTER — Ambulatory Visit

## 2024-01-26 DIAGNOSIS — M25561 Pain in right knee: Secondary | ICD-10-CM | POA: Diagnosis not present

## 2024-01-31 ENCOUNTER — Encounter: Payer: Self-pay | Admitting: Cardiology

## 2024-01-31 ENCOUNTER — Other Ambulatory Visit: Payer: Self-pay | Admitting: Neurology

## 2024-01-31 ENCOUNTER — Ambulatory Visit: Attending: Internal Medicine

## 2024-01-31 ENCOUNTER — Other Ambulatory Visit: Payer: Self-pay | Admitting: Cardiology

## 2024-01-31 VITALS — BP 128/70 | HR 65

## 2024-01-31 DIAGNOSIS — I10 Essential (primary) hypertension: Secondary | ICD-10-CM

## 2024-01-31 NOTE — Progress Notes (Signed)
 Patient here for BP check. Reports no Lightheadedness or SOB or dizziness. Stated that she is doing fine. Takes her Diltiazem  at night and other medications has been taken this morning. No other issues to report. BP log scanned in chart. Sent to provider for review.

## 2024-02-02 NOTE — Progress Notes (Signed)
 Per Elizabeth's response:  Blood pressure at this office visit looks good. No change to treatment plan. Follow-up as scheduled. Thank you for seeing her!   Advised patient of provider's response. Verbalized understanding

## 2024-02-03 ENCOUNTER — Other Ambulatory Visit (HOSPITAL_COMMUNITY): Payer: Self-pay

## 2024-02-03 ENCOUNTER — Telehealth: Payer: Self-pay

## 2024-02-03 NOTE — Telephone Encounter (Signed)
 Pharmacy Patient Advocate Encounter   Received notification from CoverMyMeds that prior authorization for Ozempic  (1 MG/DOSE) 4MG /3ML pen-injectors is required/requested.   Insurance verification completed.   The patient is insured through [facility] ADVANTAGE/RX ADVANCE .   Per test claim: PA required; PA submitted to above mentioned insurance via Latent Key/confirmation #/EOC [id number] Status is pending

## 2024-02-07 ENCOUNTER — Other Ambulatory Visit: Payer: Self-pay | Admitting: Internal Medicine

## 2024-02-14 NOTE — Telephone Encounter (Signed)
 Pharmacy Patient Advocate Encounter  Received notification from [employer] that Prior Authorization for Ozempic  (1 MG/DOSE) 4MG /3ML pen-injectors has been APPROVED from 02/03/24 to 02/02/25   PA #/Case ID/Reference #: [id number]

## 2024-02-16 DIAGNOSIS — M1711 Unilateral primary osteoarthritis, right knee: Secondary | ICD-10-CM | POA: Diagnosis not present

## 2024-02-17 DIAGNOSIS — E113411 Type 2 diabetes mellitus with severe nonproliferative diabetic retinopathy with macular edema, right eye: Secondary | ICD-10-CM | POA: Diagnosis not present

## 2024-02-17 DIAGNOSIS — H35041 Retinal micro-aneurysms, unspecified, right eye: Secondary | ICD-10-CM | POA: Diagnosis not present

## 2024-02-17 DIAGNOSIS — H34831 Tributary (branch) retinal vein occlusion, right eye, with macular edema: Secondary | ICD-10-CM | POA: Diagnosis not present

## 2024-02-17 DIAGNOSIS — H353111 Nonexudative age-related macular degeneration, right eye, early dry stage: Secondary | ICD-10-CM | POA: Diagnosis not present

## 2024-02-17 DIAGNOSIS — H353124 Nonexudative age-related macular degeneration, left eye, advanced atrophic with subfoveal involvement: Secondary | ICD-10-CM | POA: Diagnosis not present

## 2024-02-17 DIAGNOSIS — H43391 Other vitreous opacities, right eye: Secondary | ICD-10-CM | POA: Diagnosis not present

## 2024-02-17 DIAGNOSIS — H348122 Central retinal vein occlusion, left eye, stable: Secondary | ICD-10-CM | POA: Diagnosis not present

## 2024-02-17 LAB — HM DIABETES EYE EXAM

## 2024-02-18 ENCOUNTER — Encounter: Payer: Self-pay | Admitting: Internal Medicine

## 2024-02-20 DIAGNOSIS — I130 Hypertensive heart and chronic kidney disease with heart failure and stage 1 through stage 4 chronic kidney disease, or unspecified chronic kidney disease: Secondary | ICD-10-CM | POA: Diagnosis not present

## 2024-02-20 DIAGNOSIS — E1122 Type 2 diabetes mellitus with diabetic chronic kidney disease: Secondary | ICD-10-CM | POA: Diagnosis not present

## 2024-02-20 DIAGNOSIS — I482 Chronic atrial fibrillation, unspecified: Secondary | ICD-10-CM | POA: Diagnosis not present

## 2024-02-20 DIAGNOSIS — D6869 Other thrombophilia: Secondary | ICD-10-CM | POA: Diagnosis not present

## 2024-02-24 ENCOUNTER — Encounter: Payer: Self-pay | Admitting: Podiatry

## 2024-02-24 ENCOUNTER — Ambulatory Visit (INDEPENDENT_AMBULATORY_CARE_PROVIDER_SITE_OTHER): Admitting: Podiatry

## 2024-02-24 DIAGNOSIS — M79674 Pain in right toe(s): Secondary | ICD-10-CM | POA: Diagnosis not present

## 2024-02-24 DIAGNOSIS — B351 Tinea unguium: Secondary | ICD-10-CM

## 2024-02-24 DIAGNOSIS — M79675 Pain in left toe(s): Secondary | ICD-10-CM

## 2024-02-24 DIAGNOSIS — E119 Type 2 diabetes mellitus without complications: Secondary | ICD-10-CM

## 2024-02-24 NOTE — Progress Notes (Signed)
 This patient returns to my office for at risk foot care.  This patient requires this care by a professional since this patient will be at risk due to having type 2 dm with neuropathy. This patient is unable to cut nails herself since the patient cannot reach her nails.These nails are painful walking and wearing shoes.  This patient presents for at risk foot care today.  General Appearance  Alert, conversant and in no acute stress.  Vascular  Dorsalis pedis and posterior tibial  pulses are palpable  bilaterally.  Capillary return is within normal limits  bilaterally. Temperature is within normal limits  bilaterally.  Neurologic  Senn-Weinstein monofilament wire test within normal limits  bilaterally. Muscle power within normal limits bilaterally.  Nails Thick disfigured discolored nails with subungual debris  from hallux to fifth toes bilaterally. No evidence of bacterial infection or drainage bilaterally.  Orthopedic  No limitations of motion  feet .  No crepitus or effusions noted.  HAV 1st MPJ right.  Hammer toe second right.  Skin  normotropic skin with no porokeratosis noted bilaterally.  No signs of infections or ulcers noted.     Onychomycosis  Pain in right toes  Pain in left toes  Consent was obtained for treatment procedures.   Mechanical debridement of nails 1-5  bilaterally performed with a nail nipper.  Filed with dremel without incident.    Return office visit    3 months                  Told patient to return for periodic foot care and evaluation due to potential at risk complications.   Ruffin Cotton DPM

## 2024-03-03 DIAGNOSIS — I10 Essential (primary) hypertension: Secondary | ICD-10-CM | POA: Diagnosis not present

## 2024-03-03 DIAGNOSIS — R809 Proteinuria, unspecified: Secondary | ICD-10-CM | POA: Diagnosis not present

## 2024-03-03 DIAGNOSIS — E119 Type 2 diabetes mellitus without complications: Secondary | ICD-10-CM | POA: Diagnosis not present

## 2024-03-03 DIAGNOSIS — D631 Anemia in chronic kidney disease: Secondary | ICD-10-CM | POA: Diagnosis not present

## 2024-03-03 DIAGNOSIS — N189 Chronic kidney disease, unspecified: Secondary | ICD-10-CM | POA: Diagnosis not present

## 2024-03-04 ENCOUNTER — Other Ambulatory Visit: Payer: Self-pay | Admitting: Internal Medicine

## 2024-03-09 DIAGNOSIS — N1831 Chronic kidney disease, stage 3a: Secondary | ICD-10-CM | POA: Diagnosis not present

## 2024-03-09 DIAGNOSIS — E1129 Type 2 diabetes mellitus with other diabetic kidney complication: Secondary | ICD-10-CM | POA: Diagnosis not present

## 2024-03-09 DIAGNOSIS — E1122 Type 2 diabetes mellitus with diabetic chronic kidney disease: Secondary | ICD-10-CM | POA: Diagnosis not present

## 2024-03-09 DIAGNOSIS — N2581 Secondary hyperparathyroidism of renal origin: Secondary | ICD-10-CM | POA: Diagnosis not present

## 2024-03-20 DIAGNOSIS — H35041 Retinal micro-aneurysms, unspecified, right eye: Secondary | ICD-10-CM | POA: Diagnosis not present

## 2024-03-20 DIAGNOSIS — H353111 Nonexudative age-related macular degeneration, right eye, early dry stage: Secondary | ICD-10-CM | POA: Diagnosis not present

## 2024-03-20 DIAGNOSIS — H34831 Tributary (branch) retinal vein occlusion, right eye, with macular edema: Secondary | ICD-10-CM | POA: Diagnosis not present

## 2024-03-20 DIAGNOSIS — H43391 Other vitreous opacities, right eye: Secondary | ICD-10-CM | POA: Diagnosis not present

## 2024-03-20 DIAGNOSIS — E113411 Type 2 diabetes mellitus with severe nonproliferative diabetic retinopathy with macular edema, right eye: Secondary | ICD-10-CM | POA: Diagnosis not present

## 2024-03-20 DIAGNOSIS — H353124 Nonexudative age-related macular degeneration, left eye, advanced atrophic with subfoveal involvement: Secondary | ICD-10-CM | POA: Diagnosis not present

## 2024-03-20 DIAGNOSIS — H348122 Central retinal vein occlusion, left eye, stable: Secondary | ICD-10-CM | POA: Diagnosis not present

## 2024-03-20 LAB — OPHTHALMOLOGY REPORT-SCANNED

## 2024-03-23 ENCOUNTER — Encounter (HOSPITAL_COMMUNITY): Payer: Self-pay | Admitting: Internal Medicine

## 2024-03-23 ENCOUNTER — Other Ambulatory Visit: Payer: Self-pay | Admitting: Internal Medicine

## 2024-03-23 ENCOUNTER — Ambulatory Visit (HOSPITAL_COMMUNITY)
Admit: 2024-03-23 | Discharge: 2024-03-23 | Disposition: A | Discharge status: Home / Self Care | Attending: Internal Medicine | Admitting: Internal Medicine

## 2024-03-23 VITALS — BP 142/88 | HR 79 | Ht 63.0 in | Wt 131.0 lb

## 2024-03-23 DIAGNOSIS — I4819 Other persistent atrial fibrillation: Secondary | ICD-10-CM | POA: Diagnosis not present

## 2024-03-23 DIAGNOSIS — D6869 Other thrombophilia: Secondary | ICD-10-CM

## 2024-03-23 NOTE — Progress Notes (Addendum)
 Primary Care Physician: Marvine Rush, MD Primary Cardiologist: Dr Debera  Primary Electrophysiologist: Dr Inocencio  Referring Physician: Dr Inocencio [name] is a 78 y.o. female with a history of CAD, PAD, HFrEF, HTN, SSS s/p PPM, OSA, atrial flutter, atrial fibrillation who presents for follow up in the [facility]. She was hospitalized December 2017 with atrial fibrillation rapid rates. She was cardioverted to junctional rhythm. She is now status post Medtronic dual-chamber pacemaker. She has been maintained on amiodarone  but has continued to have episodes of rapid afib requiring hospitalization. She is now s/p afib and flutter ablation with Dr Inocencio. Patient is on Eliquis  for a CHADS2VASC score of 6. She did have some oozing from her groin sites and was kept two additional nights post ablation.   On follow up today, patient is s/p DCCV on 09/30/22. Unfortunately, her PPM alerted that she was back out of rhythm 3 days later. She is fatigued, especially with exertion. No bleeding issues on anticoagulation. Of note, her daughter was just diagnosed with lung cancer.   On follow up 01/05/23, she is currently in NSR. Patient is s/p Afib ablation on 12/08/22 by Dr. Inocencio. No episodes of Afib since ablation. Intermittent dizziness since last week; no syncope. Admits to maybe not being consistent with hydration. No chest pain, SOB, or trouble swallowing. Leg sites healed without issue. No missed doses of anticoagulant.  On follow up 07/27/23, she is currently in atrial flutter. Device interrogation on 1/15 showed persistent Afib. She missed a dose of Eliquis  on Sunday night. She feels like she has no energy when out of rhythm.  On follow up 01/19/24, patient is currently in A paced rhythm. S/p Afib ablation on 12/22/23 by Dr. Inocencio. No episodes of Afib since ablation. No chest pain or SOB. Leg sites healed without issue. No missed doses of anticoagulant.  On follow  up 03/23/24, patient is currently in A paced rhythm. She has felt overall better since last office visit. No missed doses of Eliquis .   Today, she denies symptoms of orthopnea, PND, lower extremity edema, dizziness, presyncope, syncope, snoring, daytime somnolence, bleeding, or neurologic sequela. The patient is tolerating medications without difficulties and is otherwise without complaint today.    Atrial Fibrillation Risk Factors:  she does have symptoms or diagnosis of sleep apnea. she is compliant with CPAP therapy. she does not have a history of rheumatic fever.   she has a BMI of Body mass index is 23.21 kg/m.SABRA Filed Weights   03/23/24 1419  Weight: 59.4 kg       Family History  Problem Relation Age of Onset   Heart disease Mother        deceased   Heart disease Father        deceased, heart disease   Diabetes Brother    Heart disease Brother    Thyroid  disease Brother    Heart disease Sister    Heart disease Brother    Thyroid  disease Brother    Lupus Daughter    Colon cancer Neg Hx     Atrial Fibrillation Management history:  Previous antiarrhythmic drugs: amiodarone   Previous cardioversions: 2019, 05/12/22, 09/30/22 Previous ablations: 08/14/22, 12/08/22, 12/22/23 CHADS2VASC score: 6 Anticoagulation history: Eliquis    Past Medical History:  Diagnosis Date   (HFpEF) heart failure with preserved ejection fraction (HCC)    Advanced nonexudative age-related macular degeneration of left eye with subfoveal involvement 06/26/2020   Ongoing, accounts for acuity  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 h/o PAF/flutter during admission in 2013 for PNA. b. PAF during adm for NSTEMI 07/2015, subsequent paroxysms since then.   B12 deficiency anemia    Branch retinal vein occlusion with macular edema of right eye (HCC) 10/11/2019   Chronic renal failure, stage 3b (HCC) 09/23/2017   Coronary artery disease  11/30/2014   a. remote MI. b. h/o PTCA with scoring balloon to OM1 11/2014. c. NSTEMI 03/2015 s/p DES to prox-mid Cx. d. 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 s/p LE angio 2015; followed by Dr. Darron - managed medically.   Pericardial effusion    a. 06/2016 after ppm - s/p pericardiocentesis.   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 s/p Medtronic PPM 06/2016, c/b lead perf/pericardial effusion.   TIA (transient ischemic attack)    Type 2 diabetes with nephropathy (HCC) 02/29/2012   Past Surgical History:  Procedure Laterality Date   A-FLUTTER ABLATION N/A 12/08/2022   Procedure: A-FLUTTER ABLATION;  Surgeon: Inocencio Soyla Lunger, MD;  Location: [facility];  Service: Cardiovascular;  Laterality: N/A;   ABDOMINAL AORTAGRAM N/A 01/03/2014   Procedure: ABDOMINAL EZELLA;  Surgeon: Deatrice DELENA Darron, MD;  Location: [facility] CATH LAB;  Service: Cardiovascular;  Laterality: N/A;   ABDOMINAL HYSTERECTOMY  06/22/1970   partial   APPENDECTOMY  02/20/1969   ATRIAL FIBRILLATION ABLATION N/A 08/14/2022    Procedure: ATRIAL FIBRILLATION ABLATION;  Surgeon: Inocencio Soyla Lunger, MD;  Location: [facility];  Service: Cardiovascular;  Laterality: N/A;   ATRIAL FIBRILLATION ABLATION N/A 12/08/2022   Procedure: ATRIAL FIBRILLATION ABLATION;  Surgeon: Inocencio Soyla Lunger, MD;  Location: [facility];  Service: Cardiovascular;  Laterality: N/A;   ATRIAL FIBRILLATION ABLATION N/A 12/22/2023   Procedure: ATRIAL FIBRILLATION ABLATION;  Surgeon: Inocencio Soyla Lunger, MD;  Location: [facility];  Service: Cardiovascular;  Laterality: N/A;   CARDIAC CATHETERIZATION  06/22/2006   Tiny OM-2 with 90% narrowing. Med tx.   CARDIAC CATHETERIZATION N/A 11/30/2014   Procedure: Left Heart Cath and Coronary Angiography;  Surgeon: Debby DELENA Sor, MD; LAD 20%, CFX 50%, OM1 95%, right PLB 30%, LV normal    CARDIAC CATHETERIZATION N/A 11/30/2014   Procedure: Coronary Balloon Angioplasty;  Surgeon: Debby DELENA Sor, MD;  Angiosculpt scoring balloon and PTCA to the OM1 reducing stenosis from 95% to less than 10%   CARDIAC CATHETERIZATION N/A 04/03/2015   Procedure: Left Heart Cath and Coronary Angiography;  Surgeon: Toribio JONELLE Fuel, MD; dLAD 50%, CFX 90%, OM1 100%, PLA  15%, LVEDP 13     CARDIAC CATHETERIZATION N/A 04/03/2015   Procedure: Coronary Stent Intervention;  Surgeon: Ozell Fell, MD; 3.0x18 mm Xience DES to the CFX     CARDIAC CATHETERIZATION N/A 08/02/2015   Procedure: Left Heart Cath and Coronary Angiography;  Surgeon: Debby DELENA Sor, MD;  Location: [facility] INVASIVE CV LAB;  Service: Cardiovascular;  Laterality: N/A;   CARDIAC CATHETERIZATION N/A 08/02/2015   Procedure: Coronary Stent Intervention;  Surgeon: Debby DELENA Sor, MD;  Location: [facility];  Service: Cardiovascular;  Laterality: N/A;   CARDIAC CATHETERIZATION N/A 06/25/2016   Procedure: Pericardiocentesis;  Surgeon: Will Gladis Norton, MD;  Location: [facility];  Service: Cardiovascular;  Laterality: N/A;   cardiac  stents     CARDIOVERSION N/A 12/15/2017   Procedure: CARDIOVERSION;  Surgeon: Alveta Aleene PARAS, MD;  Location: [facility] ENDOSCOPY;  Service: Cardiovascular;  Laterality: N/A;   CARDIOVERSION N/A 05/12/2022   Procedure: CARDIOVERSION;  Surgeon: Shlomo Wilbert SAUNDERS, MD;  Location: [facility] ENDOSCOPY;  Service: Cardiovascular;  Laterality: N/A;   CARDIOVERSION N/A 09/30/2022   Procedure: CARDIOVERSION;  Surgeon: Francyne Headland, MD;  Location: [facility];  Service: Cardiovascular;  Laterality: N/A;   CARDIOVERSION N/A 10/29/2022   Procedure: CARDIOVERSION;  Surgeon: Shlomo Wilbert SAUNDERS, MD;  Location: [facility];  Service: Cardiovascular;  Laterality: N/A;   CARDIOVERSION N/A 08/16/2023   Procedure: CARDIOVERSION;  Surgeon: Delford Maude BROCKS, MD;  Location: [facility];  Service: Cardiovascular;  Laterality: N/A;   CHOLECYSTECTOMY OPEN  02/20/1989   COLONOSCOPY  06/23/2003   Dr. Golda: pancolonic divericula, polyp, path unknown currently   COLONOSCOPY  06/22/2010   Dr. Harvey: Normal TI, scattered diverticula in entire colon, small internal hemorrhoids, normal colon biopsies. Colonoscopy in 5-10 years.    COLONOSCOPY WITH PROPOFOL  N/A 06/02/2021   pancolonic diverticulosis. Two 4-6 mm polyps in transverse colon. Sessile serrated and hyperplastic. 5 year surveillance if benefits outweigh the risks.   COLOSTOMY  05/23/1979   COLOSTOMY REVERSAL  11/21/1979   EP IMPLANTABLE DEVICE N/A 06/25/2016   Procedure: Lead Revision/Repair;  Surgeon: Will Gladis Norton, MD;  [facility]: [facility];  Service: Cardiovascular;  Laterality: N/A;   EP IMPLANTABLE DEVICE N/A 06/25/2016   Procedure: Pacemaker Implant;  Surgeon: Will Gladis Norton, MD;  Location: [facility];  Service: Cardiovascular;  Laterality: N/A;   EXCISIONAL HEMORRHOIDECTOMY  02/20/1969   EYE SURGERY Left 06/22/1998   branch vein occlusion   EYE SURGERY Left ~ 2001   smoothed out wrinkle   LEFT HEART CATH AND CORONARY  ANGIOGRAPHY N/A 05/11/2022   Procedure: LEFT HEART CATH AND CORONARY ANGIOGRAPHY;  Surgeon: Verlin Lonni BIRCH, MD;  Location: [facility];  Service: Cardiovascular;  Laterality: N/A;   LEFT OOPHORECTOMY  05/23/1979   nicked bowel, peritonitis, colostomy; colostomy reversed 1981    LOWER EXTREMITY ANGIOGRAM N/A 01/03/2014   Procedure: LOWER EXTREMITY ANGIOGRAM;  Surgeon: Deatrice DELENA Cage, MD;  Location: [facility] CATH [facility];  Service: Cardiovascular;  Laterality: N/A;   Nuclear med stress test  10/21/2011   Small area of mild ischemia inferoapically.   PARTIAL HYSTERECTOMY  02/20/1969   left ovaries, then ovaries removed later due tumors    POLYPECTOMY  06/02/2021   Procedure: POLYPECTOMY;  Surgeon: Cindie Carlin POUR, DO;  Location: [facility];  Service: Endoscopy;;   right eye surgery  03/2021   RIGHT OOPHORECTOMY  02/20/1969   TEE WITHOUT CARDIOVERSION N/A 05/12/2022   Procedure:  TRANSESOPHAGEAL ECHOCARDIOGRAM (TEE);  Surgeon: Shlomo Wilbert SAUNDERS, MD;  Location: [facility] ENDOSCOPY;  Service: Cardiovascular;  Laterality: N/A;   TEE WITHOUT CARDIOVERSION N/A 08/14/2022   Procedure: TRANSESOPHAGEAL ECHOCARDIOGRAM;  Surgeon: Inocencio Soyla Lunger, MD;  Location: [facility] INVASIVE CV LAB;  Service: Cardiovascular;  Laterality: N/A;   TEE WITHOUT CARDIOVERSION N/A 12/08/2022   Procedure: TRANSESOPHAGEAL ECHOCARDIOGRAM;  Surgeon: Inocencio Soyla Lunger, MD;  Location: [facility] INVASIVE CV LAB;  Service: Cardiovascular;  Laterality: N/A;   TRANSESOPHAGEAL ECHOCARDIOGRAM (CATH LAB) N/A 12/22/2023   Procedure: TRANSESOPHAGEAL ECHOCARDIOGRAM;  Surgeon: Inocencio Soyla Lunger, MD;  Location: [facility] INVASIVE CV LAB;  Service: Cardiovascular;  Laterality: N/A;    Current Outpatient Medications  Medication Sig Dispense Refill   acetaminophen  (TYLENOL ) 500 MG tablet Take 1,000 mg by mouth every 8 (eight) hours as needed for headache, moderate pain or mild pain. (Patient taking differently: Take 1,000 mg by mouth as needed for  headache, moderate pain (pain score 4-6) or mild pain (pain score 1-3).)     ALPRAZolam  (XANAX ) 0.25 MG tablet Take 1-2 tablets (0.25-0.5 mg total) by mouth at bedtime as needed for sleep (neuropathy). 60 tablet 0   apixaban  (ELIQUIS ) 5 MG TABS tablet Take 5 mg by mouth 2 (two) times daily.     atorvastatin  (LIPITOR ) 80 MG tablet TAKE ONE TABLET BY MOUTH EVERY DAY AT 6:00PM 90 tablet 3   calcitRIOL  (ROCALTROL ) 0.25 MCG capsule Take 0.25 mcg by mouth every Monday, Wednesday, and Friday.     carvedilol  (COREG ) 25 MG tablet TAKE ONE TABLET BY MOUTH TWICE DAILY 60 tablet 3   Cholecalciferol  50 MCG (2000 UT) TABS Take 2,000 Units by mouth at bedtime.     clopidogrel  (PLAVIX ) 75 MG tablet TAKE ONE TABLET (75MG  TOTAL) BY MOUTH DAILY 90 tablet 3   Continuous Blood Gluc Receiver (FREESTYLE LIBRE 2 READER) DEVI 1 each by Does not apply route daily. 1 each 0   Continuous Glucose Sensor (FREESTYLE LIBRE 2 PLUS SENSOR) MISC Inject 1 each into the skin as directed. Change sensor every 15 days 6 each 3   cyanocobalamin  2000 MCG tablet Take 2,000 mcg by mouth daily.     diltiazem  (CARDIZEM  CD) 180 MG 24 hr capsule Take 1 capsule (180 mg total) by mouth daily. 30 capsule 3   furosemide  (LASIX ) 40 MG tablet Take 40 mg by mouth daily as needed for fluid or edema.     glucose blood (FREESTYLE PRECISION NEO TEST) test strip Use as instructed 100 each 3   Insulin  Lispro-aabc (LYUMJEV  KWIKPEN) 100 UNIT/ML KwikPen Inject 4-6 units under skin before the 2 main meals of the day 15 mL 3   LANTUS  SOLOSTAR 100 UNIT/ML Solostar Pen Inject 20 Units into the skin daily. 15 mL 2   levothyroxine  (SYNTHROID ) 112 MCG tablet TAKE ONE TABLET BY MOUTH ONCE DAILY 90 tablet 3   nitroGLYCERIN  (NITROSTAT ) 0.4 MG SL tablet Place 1 tablet (0.4 mg total) under the tongue every 5 (five) minutes x 3 doses as needed for chest pain (If not relief after 3rd dose, call 911 or go to ED). 25 tablet 0   olmesartan  (BENICAR ) 5 MG tablet Take 2 tablets  (10 mg total) by mouth 2 (two) times daily. 180 tablet 2   rOPINIRole  (REQUIP ) 0.5 MG tablet Take 1 tablet (0.5 mg total) by mouth at bedtime. 30 tablet 5   Semaglutide , 1 MG/DOSE, (OZEMPIC , 1 MG/DOSE,) 4 MG/3ML SOPN Inject 1 mg into the skin once a week. 3 mL 2  traMADol  (ULTRAM ) 50 MG tablet Take 1 tablet (50 mg total) by mouth every 12 (twelve) hours as needed for severe pain (pain score 7-10). 10 tablet 0   No current facility-administered medications for this encounter.    Allergies  Allergen Reactions   Penicillins Hives   Percocet [Oxycodone-Acetaminophen ] Nausea And Vomiting    ROS- All systems are reviewed and negative except as per the HPI above.  Physical Exam: Vitals:   03/23/24 1419  BP: (!) 142/88  Pulse: 79  Weight: 59.4 kg  Height: 5' 3 (1.6 m)    GEN- The patient is well appearing, alert and oriented x 3 today.   Neck - no JVD or carotid bruit noted Lungs- Clear to ausculation bilaterally, normal work of breathing Heart- Regular rate and rhythm, no murmurs, rubs or gallops, PMI not laterally displaced Extremities- no clubbing, cyanosis, or edema Skin - no rash or ecchymosis noted   Wt Readings from Last 3 Encounters:  03/23/24 59.4 kg  01/19/24 60.3 kg  01/11/24 58.2 kg    EKG today demonstrates  Vent. rate 79 BPM PR interval * ms QRS duration 128 ms QT/QTcB 446/511 ms P-R-T axes * -42 140 Atrial-paced rhythm with occasional ventricular-paced complexes Left axis deviation Left ventricular hypertrophy with QRS widening and repolarization abnormality ( R in aVL , Cornell product , Romhilt-Estes ) Cannot rule out Septal infarct , age undetermined Abnormal ECG When compared with ECG of 19-Jan-2024 13:47, Previous ECG is present  ECHO 04/14/23: 1. Left ventricular ejection fraction, by estimation, is 45 to 50%. The  left ventricle has mildly decreased function. The left ventricle  demonstrates global hypokinesis. The left ventricular internal  cavity size  was mildly dilated. There is mild  concentric left ventricular hypertrophy. Left ventricular diastolic  parameters are consistent with Grade II diastolic dysfunction  (pseudonormalization).   2. Right ventricular systolic function is normal. The right ventricular  size is normal. Tricuspid regurgitation signal is inadequate for assessing  PA pressure.   3. Left atrial size was moderately dilated.   4. The mitral valve is degenerative. Trivial mitral valve regurgitation.  Moderate mitral annular calcification.   5. The aortic valve is tricuspid. There is mild calcification of the  aortic valve. Aortic valve regurgitation is not visualized. Aortic valve  sclerosis/calcification is present, without any evidence of aortic  stenosis.   6. The inferior vena cava is normal in size with greater than 50%  respiratory variability, suggesting right atrial pressure of 3 mmHg.   Comparison(s): Prior images reviewed side by side. LVEF has improved, now  in the range of 45-50%.   Epic records are reviewed at length today  CHA2DS2-VASc Score = 6  The patient's score is based upon: CHF History: 1 HTN History: 1 Diabetes History: 0 Stroke History: 0 Vascular Disease History: 1 Age Score: 2 Gender Score: 1       ASSESSMENT AND PLAN: 1. Persistent Atrial Fibrillation/atrial flutter The patient's CHA2DS2-VASc score is 6, indicating a 9.7% annual risk of stroke.   S/p afib and flutter ablation 08/14/22 S/p DCCV 09/30/22 with quick return of atrial flutter. S/p Afib ablation on 12/08/22. Amiodarone  stopped due to tremors.  S/p Afib ablation on 12/22/23 by Dr. Inocencio.  Patient is currently in A paced rhythm. Continue diltiazem  180 mg daily.   2. Secondary Hypercoagulable State (ICD10:  D68.69) The patient is at significant risk for stroke/thromboembolism based upon her CHA2DS2-VASc Score of 6.  Continue Apixaban  (Eliquis ).  No missed doses.  3. Chronic systolic CHF EF  54-49%. She appears euvolemic today.  4. Obstructive sleep apnea She uses her CPAP regularly.   5. SSS S/p PPM, followed by Dr Inocencio and the device clinic.  6. CAD No chest pain.    Follow up with Dr. Inocencio in 6 months.    Fairy Heinrich, PA-C [facility] [address] [place], [place] [address] [phone] 03/23/2024 2:34 PM

## 2024-03-27 ENCOUNTER — Encounter: Payer: Self-pay | Admitting: Internal Medicine

## 2024-03-28 NOTE — Progress Notes (Signed)
 Remote PPM Transmission

## 2024-03-29 ENCOUNTER — Other Ambulatory Visit: Payer: Self-pay

## 2024-03-31 MED ORDER — DILTIAZEM HCL ER COATED BEADS 180 MG PO CP24: 180.0000 mg | ORAL_CAPSULE | Freq: Every day | ORAL | 1 refills | Status: AC

## 2024-04-05 ENCOUNTER — Ambulatory Visit: Admitting: Internal Medicine

## 2024-04-05 ENCOUNTER — Other Ambulatory Visit

## 2024-04-05 ENCOUNTER — Ambulatory Visit (INDEPENDENT_AMBULATORY_CARE_PROVIDER_SITE_OTHER)

## 2024-04-05 ENCOUNTER — Encounter: Payer: Self-pay | Admitting: Internal Medicine

## 2024-04-05 VITALS — BP 120/74 | HR 86 | Ht 63.0 in | Wt 130.8 lb

## 2024-04-05 DIAGNOSIS — E1159 Type 2 diabetes mellitus with other circulatory complications: Secondary | ICD-10-CM | POA: Diagnosis not present

## 2024-04-05 DIAGNOSIS — E039 Hypothyroidism, unspecified: Secondary | ICD-10-CM

## 2024-04-05 DIAGNOSIS — E785 Hyperlipidemia, unspecified: Secondary | ICD-10-CM

## 2024-04-05 DIAGNOSIS — I495 Sick sinus syndrome: Secondary | ICD-10-CM | POA: Diagnosis not present

## 2024-04-05 DIAGNOSIS — Z794 Long term (current) use of insulin: Secondary | ICD-10-CM

## 2024-04-05 DIAGNOSIS — E1165 Type 2 diabetes mellitus with hyperglycemia: Secondary | ICD-10-CM | POA: Diagnosis not present

## 2024-04-05 LAB — POCT GLYCOSYLATED HEMOGLOBIN (HGB A1C): Hemoglobin A1C: 8.3 % — AB (ref 4.0–5.6)

## 2024-04-05 MED ORDER — LYUMJEV KWIKPEN 100 UNIT/ML ~~LOC~~ SOPN: PEN_INJECTOR | SUBCUTANEOUS | Status: AC

## 2024-04-05 MED ORDER — LANTUS SOLOSTAR 100 UNIT/ML ~~LOC~~ SOPN: 20.0000 [IU] | PEN_INJECTOR | Freq: Every day | SUBCUTANEOUS | Status: AC

## 2024-04-05 NOTE — Addendum Note (Signed)
 Addended by: CLEOTILDE ROLIN RAMAN on: 04/05/2024 10:22 AM   Modules accepted: Orders

## 2024-04-05 NOTE — Patient Instructions (Addendum)
 Please continue: - Ozempic  1 mg weekly   Please increase: - Lantus  24 units in am - Lyumjev  6-8 units before brunch and dinner  Continue Levothyroxine  112 mcg daily.  Take the thyroid  hormone every day, with water, at least 30 minutes before breakfast, separated by at least 4 hours from: - acid reflux medications - calcium  - iron - multivitamins  Please stop at the lab.  Please return in 3 months.

## 2024-04-05 NOTE — Progress Notes (Signed)
 Patient ID: Virginia Huber, female   DOB: 26-May-1946, 78 y.o.   MRN: [id number]   HPI: Virginia Huber is a 78 y.o.-year-old female, initially referred by her PCP, Dr.Golding, returning for follow-up for DM2, dx in ~2005, insulin -dependent since 2006, uncontrolled, with complications (CAD, s/p NSTEMI, s/p PTCA and DES; CHF; PAF/flutter; PAD; CKD; cerebrovascular disease - s/p TIA; PN).  She previously saw my colleague, Dr. Kassie.  Our last visit was 4 months ago.  Interim history: She continues to have floaters -getting intraocular injections. No increased urination, nausea, chest pain.  She has sick sinus syndrome.  She had another cardiac ablation (her third) since last visit (12/2023) She had a steroid injection in 01/2024 for a ruptured Baker's cyst.  Sugars increased to 500 and stayed elevated for approximately 2 weeks.  They did start to improve afterwards but in the last month they increased again.  DM2: Reviewed HbA1c levels: Lab Results  Component Value Date   HGBA1C 7.1 (A) 12/03/2023   HGBA1C 7.5 (A) 08/04/2023   HGBA1C 6.4 (A) 02/01/2023   HGBA1C 5.8 (A) 07/15/2022   HGBA1C 6.4 (H) 05/10/2022   HGBA1C 6.0 (A) 02/09/2022   HGBA1C 6.7 (A) 10/09/2021   HGBA1C 7.5 (A) 06/06/2021   HGBA1C 7.7 (A) 02/04/2021   HGBA1C 7.4 (A) 10/22/2020   Previously on: - NPH/R 70/30 20-22 >> 28 units in am and 28-30 before dinner >> ...>> NPH 15 units in am and 8-10  before dinner >> 5 units at bedtime - Trulicity  3 mg weekly >> Ozempic  2 mg weekly -started 05/2021 >> 1 mg weekly (off x 2 weeks). No FH of MTC or personal h/o pancreatitis. She was on Metformin  >> stopped b/c CKD.  Currently on: - Lantus  20 units in am - Lyumjev  4-6 >> 4 >> 4-6 >> 6 units before brunch and dinner - Ozempic  1 mg in am  Pt checks her sugars 4x a day with the Libre CGM (with receiver):  Previously:  Previously:   Lowest sugar was  60 >> 54 >> >70; she has hypoglycemia awareness in the 70s.  Highest  sugar was  319 >> 268 >> 500s (steroids)  Glucometer: Freestyle lite  Patient starting to improve her meals after her last HbA1c returned: - Brunch: scrambled egg + bacon + rye bread or cheerios + skim milk - Dinner: meat - grilled + veggies - Snacks: seldom - apple Drinks occasional diet drinks  -+ CKD-sees nephrology: Dr. Rachele, last BUN/creatinine:  Lab Results  Component Value Date   BUN 19 11/25/2023   BUN 28 (H) 10/05/2023   CREATININE 1.35 (H) 11/25/2023   CREATININE 1.66 (H) 10/05/2023  On Benicar  40.  -+ HL; last set of lipids: 08/05/2023: 135/97/41/76 Lab Results  Component Value Date   CHOL 96 05/10/2022   HDL 30 (L) 05/10/2022   LDLCALC 35 05/10/2022   TRIG 156 (H) 05/10/2022   CHOLHDL 3.2 05/10/2022  On Lipitor  80.  - last eye exam was on 03/20/2024: Severe NPDR + macular edema OD. Dr Elner.  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 She lost vision in L eye.  She has a history of BRAO.  She had eye surgery 03/2021 >> vision is better.  - she has numbness and tingling in her feet.  Latest foot exam was performed by podiatry-02/24/2024 - Dr. Loreda  Pt has FH of DM in M and her family.  Hypothyroidism:  Pt is on levothyroxine   112 mcg daily (dose decreased  to 08/2021), taken: - in am - fasting - at least 30 min from b'fast - + calcium  in the morning >> now at night - no iron - no multivitamins - no PPIs - not on Biotin  TSH levels were reviewed: 08/05/2023: TSH 4.510 (0.45-4.5) Lab Results  Component Value Date   TSH 1.170 03/23/2023   TSH 2.100 09/25/2022   TSH 0.571 03/17/2022   TSH 1.18 10/09/2021   TSH 0.183 (L) 08/25/2021   TSH 0.40 02/04/2021   TSH 0.22 (L) 10/22/2020   TSH 0.233 (L) 08/22/2020   TSH 2.410 02/22/2020   TSH 0.424 (L) 08/15/2019   She also has a history of heart block, tachybradycardia syndrome, pericardial effusion (history of tamponade). She has Afib  - was hospitalized in 04/2022 for cardioversion.  She  had a cardiac ablation in 07/2022, then cardioversion in 09/2022, and then again an ablation in 11/2022.  ROS: + see HPI  I reviewed pt's medications, allergies, PMH, social hx, family hx, and changes were documented in the history of present illness. Otherwise, unchanged from my initial visit note.  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 h/o PAF/flutter during admission in 2013 for PNA. b. PAF during adm for NSTEMI 07/2015, subsequent paroxysms since then.   B12 deficiency anemia    Branch retinal vein occlusion with macular edema of right eye (HCC) 10/11/2019   Chronic renal failure, stage 3b (HCC) 09/23/2017   Coronary artery disease 11/30/2014   a. remote MI. b. h/o PTCA with scoring balloon to OM1 11/2014. c. NSTEMI 03/2015 s/p DES to prox-mid Cx. d. 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 s/p LE angio 2015; followed by Dr. Darron - managed medically.   Pericardial effusion    a. 06/2016 after ppm - s/p pericardiocentesis.   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 s/p Medtronic PPM 06/2016, c/b lead perf/pericardial effusion.   TIA (transient ischemic attack)    Type 2 diabetes with nephropathy (HCC) 02/29/2012   Past Surgical History:  Procedure Laterality Date   A-FLUTTER ABLATION N/A 12/08/2022   Procedure: A-FLUTTER ABLATION;  Surgeon: Inocencio Soyla Lunger, MD;  Location: [facility];  Service: Cardiovascular;  Laterality: N/A;   ABDOMINAL AORTAGRAM N/A 01/03/2014   Procedure: ABDOMINAL EZELLA;  Surgeon: Deatrice DELENA Darron, MD;  Location:  [facility] CATH [facility];  Service: Cardiovascular;  Laterality: N/A;   ABDOMINAL HYSTERECTOMY  06/22/1970   partial   APPENDECTOMY  02/20/1969   ATRIAL FIBRILLATION ABLATION N/A 08/14/2022   Procedure: ATRIAL FIBRILLATION ABLATION;  Surgeon: Inocencio Soyla Lunger, MD;  Location: [facility];  Service: Cardiovascular;  Laterality: N/A;   ATRIAL FIBRILLATION ABLATION N/A 12/08/2022   Procedure: ATRIAL FIBRILLATION ABLATION;  Surgeon: Inocencio Soyla Lunger, MD;  Location: [facility];  Service: Cardiovascular;  Laterality: N/A;   ATRIAL FIBRILLATION ABLATION N/A 12/22/2023   Procedure: ATRIAL FIBRILLATION ABLATION;  Surgeon: Inocencio Soyla Lunger, MD;  Location: [facility];  Service: Cardiovascular;  Laterality: N/A;   CARDIAC CATHETERIZATION  06/22/2006   Tiny OM-2 with 90% narrowing. Med tx.   CARDIAC CATHETERIZATION N/A 11/30/2014   Procedure: Left Heart Cath and Coronary Angiography;  Surgeon: Debby DELENA Sor, MD; LAD 20%, CFX 50%, OM1 95%, right PLB 30%, LV normal    CARDIAC CATHETERIZATION N/A 11/30/2014   Procedure: Coronary Balloon Angioplasty;  Surgeon: Debby DELENA Sor, MD;  Angiosculpt scoring balloon and PTCA  to the OM1 reducing stenosis from 95% to less than 10%   CARDIAC CATHETERIZATION N/A 04/03/2015   Procedure: Left Heart Cath and Coronary Angiography;  Surgeon: Toribio JONELLE Fuel, MD; dLAD 50%, CFX 90%, OM1 100%, PLA 15%, LVEDP 13     CARDIAC CATHETERIZATION N/A 04/03/2015   Procedure: Coronary Stent Intervention;  Surgeon: Ozell Fell, MD; 3.0x18 mm Xience DES to the CFX     CARDIAC CATHETERIZATION N/A 08/02/2015   Procedure: Left Heart Cath and Coronary Angiography;  Surgeon: Debby DELENA Sor, MD;  Location: [facility] INVASIVE CV LAB;  Service: Cardiovascular;  Laterality: N/A;   CARDIAC CATHETERIZATION N/A 08/02/2015   Procedure: Coronary Stent Intervention;  Surgeon: Debby DELENA Sor, MD;  Location: [facility];  Service: Cardiovascular;  Laterality: N/A;   CARDIAC CATHETERIZATION N/A 06/25/2016   Procedure: Pericardiocentesis;  Surgeon: Will Lunger Inocencio, MD;  Location: [facility];  Service: Cardiovascular;  Laterality: N/A;   cardiac stents     CARDIOVERSION N/A 12/15/2017   Procedure: CARDIOVERSION;  Surgeon: Alveta Aleene PARAS, MD;  Location: [facility] ENDOSCOPY;  Service: Cardiovascular;  Laterality: N/A;   CARDIOVERSION N/A 05/12/2022   Procedure: CARDIOVERSION;  Surgeon: Shlomo Wilbert JONELLE, MD;  Location: [facility] ENDOSCOPY;  Service: Cardiovascular;  Laterality: N/A;   CARDIOVERSION N/A 09/30/2022   Procedure: CARDIOVERSION;  Surgeon: Francyne Headland, MD;  Location: [facility];  Service: Cardiovascular;  Laterality: N/A;   CARDIOVERSION N/A 10/29/2022   Procedure: CARDIOVERSION;  Surgeon: Shlomo Wilbert JONELLE, MD;  Location: [facility];  Service: Cardiovascular;  Laterality: N/A;   CARDIOVERSION N/A 08/16/2023   Procedure: CARDIOVERSION;  Surgeon: Delford Maude BROCKS, MD;  Location: [facility];  Service: Cardiovascular;  Laterality: N/A;   CHOLECYSTECTOMY OPEN  02/20/1989   COLONOSCOPY  06/23/2003   Dr. Golda: pancolonic divericula, polyp, path unknown currently    COLONOSCOPY  06/22/2010   Dr. Harvey: Normal TI, scattered diverticula in entire colon, small internal hemorrhoids, normal colon biopsies. Colonoscopy in 5-10 years.    COLONOSCOPY WITH PROPOFOL  N/A 06/02/2021   pancolonic diverticulosis. Two 4-6 mm polyps in transverse colon. Sessile serrated and hyperplastic. 5 year surveillance if benefits outweigh the risks.   COLOSTOMY  05/23/1979   COLOSTOMY REVERSAL  11/21/1979   EP IMPLANTABLE DEVICE N/A 06/25/2016   Procedure: Lead Revision/Repair;  Surgeon: Will Lunger Inocencio, MD;  Location: [facility];  Service: Cardiovascular;  Laterality: N/A;   EP IMPLANTABLE DEVICE N/A 06/25/2016   Procedure: Pacemaker Implant;  Surgeon: Will Gladis Norton, MD;  Location: [facility];  Service: Cardiovascular;  Laterality: N/A;   EXCISIONAL HEMORRHOIDECTOMY  02/20/1969   EYE SURGERY Left 06/22/1998   branch vein occlusion   EYE SURGERY Left ~ 2001   smoothed out wrinkle   LEFT HEART CATH AND CORONARY ANGIOGRAPHY N/A 05/11/2022   Procedure: LEFT HEART CATH AND CORONARY ANGIOGRAPHY;  Surgeon: Verlin Lonni BIRCH, MD;  Location: [facility];  Service: Cardiovascular;  Laterality: N/A;   LEFT OOPHORECTOMY  05/23/1979   nicked bowel, peritonitis, colostomy; colostomy reversed 1981    LOWER EXTREMITY ANGIOGRAM N/A 01/03/2014   Procedure: LOWER EXTREMITY ANGIOGRAM;  Surgeon: Deatrice DELENA Cage, MD;  Location: [facility] CATH [facility];  Service: Cardiovascular;  Laterality: N/A;   Nuclear med stress test  10/21/2011   Small area of mild ischemia inferoapically.   PARTIAL HYSTERECTOMY  02/20/1969   left ovaries, then ovaries removed later due tumors    POLYPECTOMY  06/02/2021   Procedure: POLYPECTOMY;  Surgeon: Cindie Carlin POUR, DO;  Location: [facility];  Service: Endoscopy;;   right eye surgery  03/2021   RIGHT OOPHORECTOMY  02/20/1969   TEE WITHOUT CARDIOVERSION N/A 05/12/2022   Procedure: TRANSESOPHAGEAL ECHOCARDIOGRAM (TEE);  Surgeon:  Shlomo Wilbert SAUNDERS, MD;  Location: [facility] ENDOSCOPY;  Service: Cardiovascular;  Laterality: N/A;   TEE WITHOUT CARDIOVERSION N/A 08/14/2022   Procedure: TRANSESOPHAGEAL ECHOCARDIOGRAM;  Surgeon: Norton Soyla Gladis, MD;  Location: [facility] INVASIVE CV LAB;  Service: Cardiovascular;  Laterality: N/A;   TEE WITHOUT CARDIOVERSION N/A 12/08/2022   Procedure: TRANSESOPHAGEAL ECHOCARDIOGRAM;  Surgeon: Norton Soyla Gladis, MD;  Location: [facility] INVASIVE CV LAB;  Service: Cardiovascular;  Laterality: N/A;   TRANSESOPHAGEAL ECHOCARDIOGRAM (CATH LAB) N/A 12/22/2023   Procedure: TRANSESOPHAGEAL ECHOCARDIOGRAM;  Surgeon: Norton Soyla Gladis, MD;  Location: [facility] INVASIVE CV LAB;  Service: Cardiovascular;  Laterality: N/A;   Social History   Socioeconomic History   Marital status: Married    Spouse name: Not on file   Number of children: Not on file   Years of education: Not on file   Highest education level: Not on file  Occupational History   Occupation: Retired    Associate Professor: RETIRED    Comment: insurance billing  Tobacco Use   Smoking status: Never    Passive exposure: Never   Smokeless tobacco: Never   Tobacco comments:    Never smoke 10/08/22  Vaping Use   Vaping status: Never Used  Substance and Sexual Activity   Alcohol use: No    Alcohol/week: 0.0 standard drinks of alcohol   Drug use: No   Sexual activity: Never    Birth control/protection: Surgical    Comment: hyst  Other Topics Concern   Not on file  Social History Narrative   Not on file   Social Drivers of Health   Financial Resource Strain: Not on file  Food Insecurity: No Food Insecurity (04/13/2023)   Hunger Vital Sign    Worried About Running Out of Food in the Last Year: Never true    Ran Out of Food in the Last Year: Never true  Transportation Needs: No Transportation Needs (04/13/2023)   PRAPARE - Administrator, Civil Service (Medical): No    Lack of Transportation (Non-Medical): No  Physical Activity: Not on file   Stress: Not on file  Social Connections: Not on file  Intimate Partner Violence:  Not At Risk (04/13/2023)   Humiliation, Afraid, Rape, and Kick questionnaire    Fear of Current or Ex-Partner: No    Emotionally Abused: No    Physically Abused: No    Sexually Abused: No   Current Outpatient Medications on File Prior to Visit  Medication Sig Dispense Refill   acetaminophen  (TYLENOL ) 500 MG tablet Take 1,000 mg by mouth every 8 (eight) hours as needed for headache, moderate pain or mild pain. (Patient taking differently: Take 1,000 mg by mouth as needed for headache, moderate pain (pain score 4-6) or mild pain (pain score 1-3).)     ALPRAZolam  (XANAX ) 0.25 MG tablet Take 1-2 tablets (0.25-0.5 mg total) by mouth at bedtime as needed for sleep (neuropathy). 60 tablet 0   apixaban  (ELIQUIS ) 5 MG TABS tablet Take 5 mg by mouth 2 (two) times daily.     atorvastatin  (LIPITOR ) 80 MG tablet TAKE ONE TABLET BY MOUTH EVERY DAY AT 6:00PM 90 tablet 3   calcitRIOL  (ROCALTROL ) 0.25 MCG capsule Take 0.25 mcg by mouth every Monday, Wednesday, and Friday.     carvedilol  (COREG ) 25 MG tablet TAKE ONE TABLET BY MOUTH TWICE DAILY 60 tablet 3   Cholecalciferol  50 MCG (2000 UT) TABS Take 2,000 Units by mouth at bedtime.     clopidogrel  (PLAVIX ) 75 MG tablet TAKE ONE TABLET (75MG  TOTAL) BY MOUTH DAILY 90 tablet 3   Continuous Blood Gluc Receiver (FREESTYLE LIBRE 2 READER) DEVI 1 each by Does not apply route daily. 1 each 0   Continuous Glucose Sensor (FREESTYLE LIBRE 2 PLUS SENSOR) MISC Inject 1 each into the skin as directed. Change sensor every 15 days 6 each 3   cyanocobalamin  2000 MCG tablet Take 2,000 mcg by mouth daily.     diltiazem  (CARDIZEM  CD) 180 MG 24 hr capsule Take 1 capsule (180 mg total) by mouth daily. 90 capsule 1   furosemide  (LASIX ) 40 MG tablet Take 40 mg by mouth daily as needed for fluid or edema.     glucose blood (FREESTYLE PRECISION NEO TEST) test strip Use as instructed 100 each 3    Insulin  Lispro-aabc (LYUMJEV  KWIKPEN) 100 UNIT/ML KwikPen Inject 4-6 units under skin before the 2 main meals of the day 15 mL 3   LANTUS  SOLOSTAR 100 UNIT/ML Solostar Pen Inject 20 Units into the skin daily. 15 mL 2   levothyroxine  (SYNTHROID ) 112 MCG tablet TAKE ONE TABLET BY MOUTH ONCE DAILY 90 tablet 3   nitroGLYCERIN  (NITROSTAT ) 0.4 MG SL tablet Place 1 tablet (0.4 mg total) under the tongue every 5 (five) minutes x 3 doses as needed for chest pain (If not relief after 3rd dose, call 911 or go to ED). 25 tablet 0   olmesartan  (BENICAR ) 5 MG tablet Take 2 tablets (10 mg total) by mouth 2 (two) times daily. 180 tablet 2   OZEMPIC , 1 MG/DOSE, 4 MG/3ML SOPN INJECT 1MG  INTO THE SKIN ONCE A WEEK 3 mL 2   rOPINIRole  (REQUIP ) 0.5 MG tablet Take 1 tablet (0.5 mg total) by mouth at bedtime. 30 tablet 5   traMADol  (ULTRAM ) 50 MG tablet Take 1 tablet (50 mg total) by mouth every 12 (twelve) hours as needed for severe pain (pain score 7-10). 10 tablet 0   No current facility-administered medications on file prior to visit.   Allergies  Allergen Reactions   Penicillins Hives   Percocet [Oxycodone-Acetaminophen ] Nausea And Vomiting   Family History  Problem Relation Age of Onset   Heart disease Mother  deceased   Heart disease Father        deceased, heart disease   Diabetes Brother    Heart disease Brother    Thyroid  disease Brother    Heart disease Sister    Heart disease Brother    Thyroid  disease Brother    Lupus Daughter    Colon cancer Neg Hx    PE: BP 120/74   Pulse 86   Ht 5' 3 (1.6 m)   Wt 130 lb 12.8 oz (59.3 kg)   SpO2 94%   BMI 23.17 kg/m  Wt Readings from Last 3 Encounters:  04/05/24 130 lb 12.8 oz (59.3 kg)  03/23/24 131 lb (59.4 kg)  01/19/24 133 lb (60.3 kg)   Constitutional: normal weight, in NAD Eyes: no exophthalmos ENT: no masses palpated in neck, no cervical lymphadenopathy Cardiovascular: RRR, No MRG Respiratory: CTA B Musculoskeletal: no  deformities Skin: no rashes Neurological: + tremor with outstretched hands - L>R  ASSESSMENT: 1. DM2, insulin -dependent, now more controlled, withcomplications - CAD, s/p NSTEMI 07/2015 and 03/2015, s/p PTCA and DES - CHF - PAF/flutter - PAD - cerebrovascular disease - s/p TIA - CKD  2. HL  3.  Hypothyroidism  PLAN:  1. Patient with longstanding, previously uncontrolled type 2 diabetes, with improved control on a weekly GLP-1 receptor agonist along with her basal/bolus insulin  regimen.  She was on a premixed insulin  regimen previously with higher blood sugars, up to 300s, but sugars improved and her HbA1c was 7.1% at last visit.  At that time, reviewing the CGM trends, sugars were improved and they were fluctuating mostly within the target range but she had occasional significant hyperglycemic exceptions after brunch and after dinner.  She was using a fixed dose of Lyumjev , 4 units, before these meals, and we discussed that for larger meals, she may need a higher dose, for example 6 units.  We did not change the rest of the regimen. - we previously discussed about adding an SGLT2 inhibitor in the past but I advised her to check with her nephrologist first.  Dr. Rachele recommended to hold off using an SGLT2 inhibitor as her kidney function was trending down. CGM interpretation: -At today's visit, we reviewed her CGM downloads: It appears that 62% of values are in target range (goal >70%), while 38% are higher than 180 (goal <25%), and 0% are lower than 70 (goal <4%).  The calculated average blood sugar is 174.  The projected HbA1c for the next 3 months (GMI) is 7.5%. -Reviewing the CGM trends, sugars appear to be fluctuating higher in the target range and increasing significantly above target after breakfast.  Sugars then improved but remained fluctuating around 180 mg/dL in the late afternoon and in the first half of the night.  For now, I recommended to try to increase the Lantus  dose  slightly and also to use higher doses of Lyumjev , mostly 8 units for breakfast and varying between 6 to 8 units for dinner.  Will continue the same dose of Ozempic  for now. - I suggested to:  Patient Instructions  Please continue: - Ozempic  1 mg weekly   Please increase: - Lantus  24 units in am - Lyumjev  6-8 units before brunch and dinner  Continue Levothyroxine  112 mcg daily.  Take the thyroid  hormone every day, with water, at least 30 minutes before breakfast, separated by at least 4 hours from: - acid reflux medications - calcium  - iron - multivitamins  Please stop at the lab.  Please return  in 3 months.  - we checked her HbA1c: 8.3% (higher) - advised to check sugars at different times of the day - 4x a day, rotating check times - advised for yearly eye exams >> she is UTD - return to clinic in 3 months  2. HL - Latest lipid panel showed an LDL above her goal of less than 55, the rest of the fractions at goal: 08/05/2023: 135/97/41/76 - She continues on Lipitor  80 mg daily without side effects  3.  Hypothyroidism - latest thyroid  labs reviewed with pt. >> TSH was very minimally elevated on 08/05/2023: 4.51 (0.45-4.5) - she continues on LT4 112 mcg daily - pt feels good on this dose. - we discussed about taking the thyroid  hormone every day, with water, >30 minutes before breakfast, separated by >4 hours from acid reflux medications, calcium , iron, multivitamins. Pt. is taking it correctly now.  At last visit, she is was taking calcium  in the morning and we discussed about moving this at least 4 hours after levothyroxine . - will check thyroid  tests today: TSH and fT4 - If labs are abnormal, she will need to return for repeat TFTs in 1.5 months  Lela Fendt, MD PhD [facility] Endocr[facility]

## 2024-04-06 ENCOUNTER — Ambulatory Visit: Payer: Self-pay | Admitting: Internal Medicine

## 2024-04-06 LAB — CUP PACEART REMOTE DEVICE CHECK
Battery Remaining Longevity: 27 mo
Battery Voltage: 2.94 V
Brady Statistic AP VP Percent: 0.96 %
Brady Statistic AP VS Percent: 71.13 %
Brady Statistic AS VP Percent: 0.08 %
Brady Statistic AS VS Percent: 27.82 %
Brady Statistic RA Percent Paced: 70.08 %
Brady Statistic RV Percent Paced: 1.21 %
Date Time Interrogation Session: 20251015104318
Implantable Lead Connection Status: 753985
Implantable Lead Connection Status: 753985
Implantable Lead Implant Date: 20180104
Implantable Lead Implant Date: 20180104
Implantable Lead Location: 753859
Implantable Lead Location: 753860
Implantable Lead Model: 5076
Implantable Lead Model: 5076
Implantable Pulse Generator Implant Date: 20180104
Lead Channel Impedance Value: 323 Ohm
Lead Channel Impedance Value: 380 Ohm
Lead Channel Impedance Value: 380 Ohm
Lead Channel Impedance Value: 437 Ohm
Lead Channel Pacing Threshold Amplitude: 0.5 V
Lead Channel Pacing Threshold Amplitude: 0.875 V
Lead Channel Pacing Threshold Pulse Width: 0.4 ms
Lead Channel Pacing Threshold Pulse Width: 0.4 ms
Lead Channel Sensing Intrinsic Amplitude: 1.75 mV
Lead Channel Sensing Intrinsic Amplitude: 1.75 mV
Lead Channel Sensing Intrinsic Amplitude: 13.25 mV
Lead Channel Sensing Intrinsic Amplitude: 13.25 mV
Lead Channel Setting Pacing Amplitude: 2 V
Lead Channel Setting Pacing Amplitude: 2.5 V
Lead Channel Setting Pacing Pulse Width: 0.4 ms
Lead Channel Setting Sensing Sensitivity: 2.8 mV
Zone Setting Status: 755011

## 2024-04-06 LAB — TSH: TSH: 2.57 m[IU]/L (ref 0.40–4.50)

## 2024-04-06 LAB — T4, FREE: Free T4: 1.6 ng/dL (ref 0.8–1.8)

## 2024-04-11 ENCOUNTER — Other Ambulatory Visit: Payer: Self-pay | Admitting: Neurology

## 2024-04-11 NOTE — Telephone Encounter (Signed)
 Last filled by patient on 06/24/23 Last office visit : 08/25/2023 Next office visit : 07/04/24 - medication not listed in last note (08/25/23) please advise

## 2024-04-11 NOTE — Progress Notes (Signed)
 Remote PPM Transmission

## 2024-04-18 ENCOUNTER — Ambulatory Visit: Payer: Self-pay | Admitting: Cardiology

## 2024-04-20 DIAGNOSIS — E113411 Type 2 diabetes mellitus with severe nonproliferative diabetic retinopathy with macular edema, right eye: Secondary | ICD-10-CM | POA: Diagnosis not present

## 2024-04-20 DIAGNOSIS — H34831 Tributary (branch) retinal vein occlusion, right eye, with macular edema: Secondary | ICD-10-CM | POA: Diagnosis not present

## 2024-04-20 DIAGNOSIS — H353124 Nonexudative age-related macular degeneration, left eye, advanced atrophic with subfoveal involvement: Secondary | ICD-10-CM | POA: Diagnosis not present

## 2024-04-20 DIAGNOSIS — I4891 Unspecified atrial fibrillation: Secondary | ICD-10-CM | POA: Diagnosis not present

## 2024-04-20 DIAGNOSIS — H35041 Retinal micro-aneurysms, unspecified, right eye: Secondary | ICD-10-CM | POA: Diagnosis not present

## 2024-04-20 DIAGNOSIS — H348122 Central retinal vein occlusion, left eye, stable: Secondary | ICD-10-CM | POA: Diagnosis not present

## 2024-04-20 DIAGNOSIS — H43391 Other vitreous opacities, right eye: Secondary | ICD-10-CM | POA: Diagnosis not present

## 2024-04-20 DIAGNOSIS — H353111 Nonexudative age-related macular degeneration, right eye, early dry stage: Secondary | ICD-10-CM | POA: Diagnosis not present

## 2024-04-20 LAB — HM DIABETES EYE EXAM

## 2024-04-28 ENCOUNTER — Other Ambulatory Visit: Payer: Self-pay | Admitting: Nurse Practitioner

## 2024-04-28 DIAGNOSIS — I4819 Other persistent atrial fibrillation: Secondary | ICD-10-CM

## 2024-05-01 ENCOUNTER — Encounter: Payer: Self-pay | Admitting: Cardiology

## 2024-05-01 MED ORDER — APIXABAN 5 MG PO TABS: 5.0000 mg | ORAL_TABLET | Freq: Two times a day (BID) | ORAL | 1 refills | Status: AC

## 2024-05-23 DIAGNOSIS — H348122 Central retinal vein occlusion, left eye, stable: Secondary | ICD-10-CM | POA: Diagnosis not present

## 2024-05-23 DIAGNOSIS — I4891 Unspecified atrial fibrillation: Secondary | ICD-10-CM | POA: Diagnosis not present

## 2024-05-23 DIAGNOSIS — H353111 Nonexudative age-related macular degeneration, right eye, early dry stage: Secondary | ICD-10-CM | POA: Diagnosis not present

## 2024-05-23 DIAGNOSIS — H34831 Tributary (branch) retinal vein occlusion, right eye, with macular edema: Secondary | ICD-10-CM | POA: Diagnosis not present

## 2024-05-23 DIAGNOSIS — H43391 Other vitreous opacities, right eye: Secondary | ICD-10-CM | POA: Diagnosis not present

## 2024-05-23 DIAGNOSIS — H35041 Retinal micro-aneurysms, unspecified, right eye: Secondary | ICD-10-CM | POA: Diagnosis not present

## 2024-05-23 DIAGNOSIS — H353124 Nonexudative age-related macular degeneration, left eye, advanced atrophic with subfoveal involvement: Secondary | ICD-10-CM | POA: Diagnosis not present

## 2024-05-23 DIAGNOSIS — E113411 Type 2 diabetes mellitus with severe nonproliferative diabetic retinopathy with macular edema, right eye: Secondary | ICD-10-CM | POA: Diagnosis not present

## 2024-05-25 ENCOUNTER — Encounter: Payer: Self-pay | Admitting: Podiatry

## 2024-05-25 ENCOUNTER — Ambulatory Visit: Admitting: Podiatry

## 2024-05-25 DIAGNOSIS — B351 Tinea unguium: Secondary | ICD-10-CM

## 2024-05-25 DIAGNOSIS — M79674 Pain in right toe(s): Secondary | ICD-10-CM | POA: Diagnosis not present

## 2024-05-25 DIAGNOSIS — M79675 Pain in left toe(s): Secondary | ICD-10-CM | POA: Diagnosis not present

## 2024-05-25 DIAGNOSIS — E119 Type 2 diabetes mellitus without complications: Secondary | ICD-10-CM

## 2024-05-25 NOTE — Progress Notes (Signed)
 This patient returns to my office for at risk foot care.  This patient requires this care by a professional since this patient will be at risk due to having type 2 dm with neuropathy. This patient is unable to cut nails herself since the patient cannot reach her nails.These nails are painful walking and wearing shoes.  This patient presents for at risk foot care today.  General Appearance  Alert, conversant and in no acute stress.  Vascular  Dorsalis pedis and posterior tibial  pulses are palpable  bilaterally.  Capillary return is within normal limits  bilaterally. Temperature is within normal limits  bilaterally.  Neurologic  Senn-Weinstein monofilament wire test within normal limits  bilaterally. Muscle power within normal limits bilaterally.  Nails Thick disfigured discolored nails with subungual debris  from hallux to fifth toes bilaterally. No evidence of bacterial infection or drainage bilaterally.  Orthopedic  No limitations of motion  feet .  No crepitus or effusions noted.  HAV 1st MPJ right.  Hammer toe second right.  Skin  normotropic skin with no porokeratosis noted bilaterally.  No signs of infections or ulcers noted.     Onychomycosis  Pain in right toes  Pain in left toes  Consent was obtained for treatment procedures.   Mechanical debridement of nails 1-5  bilaterally performed with a nail nipper.  Filed with dremel without incident.    Return office visit    3 months                  Told patient to return for periodic foot care and evaluation due to potential at risk complications.   Ruffin Cotton DPM

## 2024-06-01 ENCOUNTER — Other Ambulatory Visit: Payer: Self-pay

## 2024-06-01 ENCOUNTER — Telehealth: Payer: Self-pay

## 2024-06-01 MED ORDER — OLMESARTAN MEDOXOMIL 5 MG PO TABS: 10.0000 mg | ORAL_TABLET | Freq: Two times a day (BID) | ORAL | 2 refills | Status: AC

## 2024-06-01 NOTE — Telephone Encounter (Signed)
 Pt of Dr. Debera. Pt's Olmesartan  normally states to take 2 tablets twice a day. An RX was sent in to the Pharmacy on 05/31/24 for Olmesartan  take 1 time daily by Almarie Crate NP. Please advise on this with Pharmacy.

## 2024-06-01 NOTE — Telephone Encounter (Signed)
 Olmesartan  dose and directions confirmed with Camellia Ahle CMA and refill sent to [facility].

## 2024-06-02 ENCOUNTER — Other Ambulatory Visit: Payer: Self-pay | Admitting: Cardiology

## 2024-06-17 ENCOUNTER — Other Ambulatory Visit: Payer: Self-pay | Admitting: Internal Medicine

## 2024-07-04 ENCOUNTER — Encounter: Payer: Self-pay | Admitting: Family Medicine

## 2024-07-04 ENCOUNTER — Ambulatory Visit: Admitting: Family Medicine

## 2024-07-04 VITALS — BP 134/82 | HR 66 | Ht 63.0 in | Wt 132.0 lb

## 2024-07-04 DIAGNOSIS — G2581 Restless legs syndrome: Secondary | ICD-10-CM

## 2024-07-04 DIAGNOSIS — G4733 Obstructive sleep apnea (adult) (pediatric): Secondary | ICD-10-CM

## 2024-07-04 DIAGNOSIS — I480 Paroxysmal atrial fibrillation: Secondary | ICD-10-CM | POA: Diagnosis not present

## 2024-07-04 DIAGNOSIS — G620 Drug-induced polyneuropathy: Secondary | ICD-10-CM

## 2024-07-04 MED ORDER — ROPINIROLE HCL 0.25 MG PO TABS: 0.2500 mg | ORAL_TABLET | Freq: Three times a day (TID) | ORAL | 3 refills | Status: AC

## 2024-07-04 NOTE — Progress Notes (Signed)
 SABRA

## 2024-07-04 NOTE — Progress Notes (Signed)
 "   PATIENT: Virginia Huber DOB: 1946/01/08  REASON FOR VISIT: follow up HISTORY FROM: patient  Chief Complaint  Patient presents with   RM1    Pt is here Alone. Pt states she has been doing good since her last appointment.       HISTORY OF PRESENT ILLNESS:  07/04/2024 ALL:  Laneta returns for follow up for OSA on CPAP, RLS and polyneuropathy. She was last seen by Dr Chalice 08/2023.   Neuropathy is stable. Mild burning and numbness but she feels it is manageable. She has discontinued amiodarone . She had third ablation and feels rhythm has been normal since.   She continues ropinirole  0.5mg  daily for RLS. She is not sure she needs this dose any longer. She rarely has symptoms. She is using CPAP nightly. She denies concerns with machine or supplies.     08/25/2023 CD:  Virginia Huber is a 79 y.o. female patient who is here for revisit 08/25/2023 for  follow up on multiple tests with our office, labs.   We are meeting to summarize our testing that has been done through neurology and through my orders.  The patient underwent a nerve conduction EMG study with Dr. Modena Heinrich here in the office she presented with 2 years of gradual onset unsteady gait, bilateral paresthesias in her feet chronic lower back pain but also increasing fatigue and even shortness of breath with presyncope.  Nerve conduction study showed a moderately slow conduction and decreased amplitude the peroneal sensory responses were absent.  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  What this means is it either medication or endocrine related it is not an autoimmune transmitted pathway.  Her MRI of the brain showed no active strokes but she has suffered multiple lacune's in both brain hemispheres in the past.  These affect the area around the basal ganglia and there is chronic mild  ischemic disease.  Mild generalized cerebral atrophy which is age-appropriate.  The lacunar infarcts main risk factor is hypertension but for her small vessel disease atrial fibrillation and decreased perfusion is also a mechanism to consider.   MRI of the cervical spine was performed November 5 and showed no significant stenosis, she had some some small disc protrusions none of them interfaced with the spinal cord or compressed and exiting nerve.  She is followed in our clinic originally for sleep apnea and she is on CPAP highly compliant patient 97% compliance for the last 30 days with an average use at time of 8 hours 7 minutes.  CPAP is set at 10 cm water and achieved a residual AHI of 3.3/h, there are equal amounts of obstructive and central events among the residuals.  She does not have a high air leak so her mask seems to be fitting well.   She endorsed the Epworth Sleepiness Scale at 4 points, fatigue severity at 38 points and this can clearly be related to her cardiac condition.   Geriatric depression scale was endorsed at 1 out of 15 points.  The patient is concerned but she is not clinically depressed.   Lab review with Dr. Bertha practice showed normal white blood cell count a slight very slight decrease in MCHC.  Her neutrophils were ever so slightly increased at 7.2 when the normal lab limits is 7.0.  This was a nonfasting test creatinine was 1.25 and BUN was 19  so this is not all related to hydration.  It is an elevation of the creatinine with a reduction in glomerular filtration rate.  The immunoglobulin study showed an IgG to be decreased at 560 and this again is mild and not significant in the context of a lack of spike protein there was no Ig M.  Vitamin B12 was in normal range homocystine level was normal, ENA antibodies were normal, C-reactive protein was less than 1 vitamin B6 was normal thiamine  was normal.  ANA was negative.  So based on this there is no significant vasculitis,  there have been small lacunar strokes in the past but no acute development over the last year, the small vessel disease is likely related to output.  Her cardiac condition is the most likely explanation for her fatigue, clumsiness.  Her medication side effects of amiodarone  used to treat atrial fibrillation is well-known to cause sensorimotor polyneuropathy. .  Chief concern according to patient :  I have ongoing cardiac problems, I have had 4 unsuccessful cardioversions.   I had 2 ablations. The cardiologists want me to switch from amiodarone  to Tikosyn.   I think it all comes back to my heart. My pacemaker check revealed that the left ventricle is racing and the atrium is not in synch . I am fatigued because of this   05/07/2022 ALL:  Virginia Huber returns for follow up for OSA on CPAP. She is doing well on therapy. She does not like to use her machine but understands medical need. She does not feel any better when using CPAP. If she travels she does not take it with her and feels that she sleeps well.   Bilateral hand tremor seems to be worsening. She is scheduled for an ablation in 07/2022 and hoping to discontinue amiodarone . She is hoping tremor improves.     05/06/2021 ALL: Virginia Huber is a 79 y.o. female here today for follow up for OSA on CPAP. She is doing well on therapy. She does feel more refreshed when using CPAP. She continues to follow closely with cardiology and ophthalmology. She is s/p eye surgery and was unable to use CPAP for about 2 weeks in October. Tremor is stable. Seems to be worse with activity. Both hands effected. Neuropathy is stable. She is followed closely by PCP.      HISTORY: (copied from Dr Dohmeier's previous note)  HPI:  Virginia Huber is a 79 y.o. female patient , seen in a RV after sleep study and for CPAP compliance. CC: Loud snoring, dependent odema, atrial fib, CHF, witnessed apnea, fatigue, TIA.  She reports her family is doing well, nobody has fallen sick  during this pandemic. Her grandson got infected as an EMT, recovered well. Her short term memory has been slowly declining and she has trouble to fall asleep and stay asleep. Her sleep study was performed elsewhere on 04-12-2016. She has been doing exquisitely well with her CPAP use 97% compliance by time and by days with an average of 8 hours and 12 minutes her CPAP is set for 10 cm pressure with 3 cm EPR and is now 79 years old.  Her residual AHI is low at 1.4 apneas per hour and she has very minimal air leakage.  This means that her mask fits well and that her apnea is well controlled I would not have to do any adjustments to that.  Any new mask or supplies go through her DME [employer].  The patient also endorsed only  2 points on the Epworth Sleepiness Scale at 18 points on the fatigue severity scale, and 1 point out of 15 on the geriatric depression score.   She is fully vaccinated against Covid.  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  She was taken recently of Metformin  for at the beginning of the year and since then had actually more trouble controlling her blood sugar and her blood pressure today.  The step to discontinue Metformin  was meant to put protect her kidney function. She is finally seen by Alyce Staff, MD - endocrinologist.     The patient continues to be a highly compliant CPAP user she has used her machine 28 over the last 30 days ending on 05 February 2019.  93% compliance for time and days with an average user time of 7 hours 27 minutes, she usually sleeps 8 hours or uses the machine 8 hours at night.  The pressure is set at 10 cmH2O her machine is provided by [employer].  She has an expiratory pressure relief setting of 3 cm and uses she has been using a full facemask with very little air leakage the 95th percentile is 1.2 L/min and a residual AHI is 1.0.  However she reports difficulties with falling asleep, staying asleep and with the mask fit by  now.  She feels that she would be ready for change maybe trying a nasal mask or nasal cradle. Sometimes she drools at night.  Trazodone - 25 mg , a half tablet, she can increase to a full if needed. I will order a nasal mask, such as the wisp.      REVIEW OF SYSTEMS: Out of a complete 14 system review of symptoms, the patient complains only of the following symptoms, neuropathy, tremor, and all other reviewed systems are negative.  ESS: 3/24  ALLERGIES: Allergies  Allergen Reactions   Penicillins Hives   Percocet [Oxycodone-Acetaminophen ] Nausea And Vomiting    HOME MEDICATIONS: Outpatient Medications Prior to Visit  Medication Sig Dispense Refill   acetaminophen  (TYLENOL ) 500 MG tablet Take 1,000 mg by mouth every 8 (eight) hours as needed for headache, moderate pain or mild pain. (Patient taking differently: Take 1,000 mg by mouth as needed for headache, moderate pain (pain score 4-6) or mild pain (pain score 1-3).)     ALPRAZolam  (XANAX ) 0.25 MG tablet TAKE 1/2-1 TABLET BY MOUTH AT BEDTIME AS NEEDED FOR SLEEP 30 tablet 0   apixaban  (ELIQUIS ) 5 MG TABS tablet Take 1 tablet (5 mg total) by mouth 2 (two) times daily. 180 tablet 1   atorvastatin  (LIPITOR ) 80 MG tablet TAKE ONE TABLET BY MOUTH EVERY DAY AT 6:00PM 90 tablet 3   calcitRIOL  (ROCALTROL ) 0.25 MCG capsule Take 0.25 mcg by mouth every Monday, Wednesday, and Friday.     carvedilol  (COREG ) 25 MG tablet TAKE ONE TABLET BY MOUTH TWICE DAILY 180 tablet 3   Cholecalciferol  50 MCG (2000 UT) TABS Take 2,000 Units by mouth at bedtime.     clopidogrel  (PLAVIX ) 75 MG tablet TAKE ONE TABLET (75MG  TOTAL) BY MOUTH DAILY 90 tablet 3   Continuous Blood Gluc Receiver (FREESTYLE LIBRE 2 READER) DEVI 1 each by Does not apply route daily. 1 each 0   Continuous Glucose Sensor (FREESTYLE LIBRE 2 PLUS SENSOR) MISC Inject 1 each into the skin as directed. Change sensor every 15 days 6 each 3   cyanocobalamin  2000 MCG tablet Take 2,000 mcg by mouth  daily.     diltiazem  (CARDIZEM  CD) 180 MG 24  hr capsule Take 1 capsule (180 mg total) by mouth daily. 90 capsule 1   furosemide  (LASIX ) 40 MG tablet Take 40 mg by mouth daily as needed for fluid or edema.     glucose blood (FREESTYLE PRECISION NEO TEST) test strip Use as instructed 100 each 3   insulin  glargine (LANTUS  SOLOSTAR) 100 UNIT/ML Solostar Pen Inject 20-24 Units into the skin daily.     Insulin  Lispro-aabc (LYUMJEV  KWIKPEN) 100 UNIT/ML KwikPen Inject 6-10 units under skin before the 2 main meals of the day     levothyroxine  (SYNTHROID ) 112 MCG tablet TAKE ONE TABLET BY MOUTH ONCE DAILY 90 tablet 3   nitroGLYCERIN  (NITROSTAT ) 0.4 MG SL tablet Place 1 tablet (0.4 mg total) under the tongue every 5 (five) minutes x 3 doses as needed for chest pain (If not relief after 3rd dose, call 911 or go to ED). 25 tablet 0   olmesartan  (BENICAR ) 5 MG tablet Take 2 tablets (10 mg total) by mouth 2 (two) times daily. 360 tablet 2   OZEMPIC , 1 MG/DOSE, 4 MG/3ML SOPN INJECT 1MG  INTO THE SKIN ONCE A WEEK 3 mL 2   rOPINIRole  (REQUIP ) 0.5 MG tablet Take 1 tablet (0.5 mg total) by mouth at bedtime. 30 tablet 5   traMADol  (ULTRAM ) 50 MG tablet Take 1 tablet (50 mg total) by mouth every 12 (twelve) hours as needed for severe pain (pain score 7-10). (Patient not taking: Reported on 07/04/2024) 10 tablet 0   No facility-administered medications prior to visit.    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 h/o PAF/flutter during admission in 2013 for PNA. b. PAF during adm for NSTEMI 07/2015, subsequent paroxysms since then.   B12 deficiency anemia    Branch retinal vein occlusion with macular  edema of right eye (HCC) 10/11/2019   Chronic renal failure, stage 3b (HCC) 09/23/2017   Coronary artery disease 11/30/2014   a. remote MI. b. h/o PTCA with scoring balloon to OM1 11/2014. c. NSTEMI 03/2015 s/p DES to prox-mid Cx. d. 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 s/p LE angio 2015; followed by Dr. Darron - managed medically.   Pericardial effusion    a. 06/2016 after ppm - s/p pericardiocentesis.   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 s/p Medtronic PPM 06/2016,  c/b lead perf/pericardial effusion.   TIA (transient ischemic attack)    Type 2 diabetes with nephropathy (HCC) 02/29/2012    PAST SURGICAL HISTORY: Past Surgical History:  Procedure Laterality Date   A-FLUTTER ABLATION N/A 12/08/2022   Procedure: A-FLUTTER ABLATION;  Surgeon: Inocencio Soyla Lunger, MD;  Location: [facility];  Service: Cardiovascular;  Laterality: N/A;   ABDOMINAL AORTAGRAM N/A 01/03/2014   Procedure: ABDOMINAL EZELLA;  Surgeon: Deatrice DELENA Cage, MD;  Location: [facility];  Service: Cardiovascular;   Laterality: N/A;   ABDOMINAL HYSTERECTOMY  06/22/1970   partial   APPENDECTOMY  02/20/1969   ATRIAL FIBRILLATION ABLATION N/A 08/14/2022   Procedure: ATRIAL FIBRILLATION ABLATION;  Surgeon: Inocencio Soyla Lunger, MD;  Location: [facility];  Service: Cardiovascular;  Laterality: N/A;   ATRIAL FIBRILLATION ABLATION N/A 12/08/2022   Procedure: ATRIAL FIBRILLATION ABLATION;  Surgeon: Inocencio Soyla Lunger, MD;  Location: [facility];  Service: Cardiovascular;  Laterality: N/A;   ATRIAL FIBRILLATION ABLATION N/A 12/22/2023   Procedure: ATRIAL FIBRILLATION ABLATION;  Surgeon: Inocencio Soyla Lunger, MD;  Location: [facility];  Service: Cardiovascular;  Laterality: N/A;   CARDIAC CATHETERIZATION  06/22/2006   Tiny OM-2 with 90% narrowing. Med tx.   CARDIAC CATHETERIZATION N/A 11/30/2014   Procedure: Left Heart Cath and Coronary Angiography;  Surgeon: Debby DELENA Sor, MD; LAD 20%, CFX 50%, OM1 95%, right PLB 30%, LV normal    CARDIAC CATHETERIZATION N/A 11/30/2014   Procedure: Coronary Balloon Angioplasty;  Surgeon: Debby DELENA Sor, MD;  Angiosculpt scoring balloon and PTCA to the OM1 reducing stenosis from 95% to less than 10%   CARDIAC CATHETERIZATION N/A 04/03/2015   Procedure: Left Heart Cath and Coronary Angiography;  Surgeon: Toribio JONELLE Fuel, MD; dLAD 50%, CFX 90%, OM1 100%, PLA 15%, LVEDP 13     CARDIAC CATHETERIZATION N/A 04/03/2015   Procedure: Coronary Stent Intervention;  Surgeon: Ozell Fell, MD; 3.0x18 mm Xience DES to the CFX     CARDIAC CATHETERIZATION N/A 08/02/2015   Procedure: Left Heart Cath and Coronary Angiography;  Surgeon: Debby DELENA Sor, MD;  Location: [facility] INVASIVE CV LAB;  Service: Cardiovascular;  Laterality: N/A;   CARDIAC CATHETERIZATION N/A 08/02/2015   Procedure: Coronary Stent Intervention;  Surgeon: Debby DELENA Sor, MD;  Location: [facility];  Service: Cardiovascular;  Laterality: N/A;   CARDIAC CATHETERIZATION N/A 06/25/2016    Procedure: Pericardiocentesis;  Surgeon: Will Lunger Inocencio, MD;  Location: [facility];  Service: Cardiovascular;  Laterality: N/A;   cardiac stents     CARDIOVERSION N/A 12/15/2017   Procedure: CARDIOVERSION;  Surgeon: Alveta Aleene PARAS, MD;  Location: [facility] ENDOSCOPY;  Service: Cardiovascular;  Laterality: N/A;   CARDIOVERSION N/A 05/12/2022   Procedure: CARDIOVERSION;  Surgeon: Shlomo Wilbert JONELLE, MD;  Location: [facility] ENDOSCOPY;  Service: Cardiovascular;  Laterality: N/A;   CARDIOVERSION N/A 09/30/2022   Procedure: CARDIOVERSION;  Surgeon: Francyne Headland, MD;  Location: [facility];  Service: Cardiovascular;  Laterality: N/A;   CARDIOVERSION N/A 10/29/2022   Procedure: CARDIOVERSION;  Surgeon: Shlomo Wilbert JONELLE, MD;  Location: [facility];  Service: Cardiovascular;  Laterality: N/A;   CARDIOVERSION N/A 08/16/2023   Procedure: CARDIOVERSION;  Surgeon: Delford Maude BROCKS, MD;  Location: [facility];  Service: Cardiovascular;  Laterality: N/A;   CHOLECYSTECTOMY OPEN  02/20/1989   COLONOSCOPY  06/23/2003   Dr. Golda: pancolonic divericula, polyp, path unknown currently   COLONOSCOPY  06/22/2010   Dr. Harvey: Normal TI,  scattered diverticula in entire colon, small internal hemorrhoids, normal colon biopsies. Colonoscopy in 5-10 years.    COLONOSCOPY WITH PROPOFOL  N/A 06/02/2021   pancolonic diverticulosis. Two 4-6 mm polyps in transverse colon. Sessile serrated and hyperplastic. 5 year surveillance if benefits outweigh the risks.   COLOSTOMY  05/23/1979   COLOSTOMY REVERSAL  11/21/1979   EP IMPLANTABLE DEVICE N/A 06/25/2016   Procedure: Lead Revision/Repair;  Surgeon: Will Gladis Norton, MD;  Location: [facility];  Service: Cardiovascular;  Laterality: N/A;   EP IMPLANTABLE DEVICE N/A 06/25/2016   Procedure: Pacemaker Implant;  Surgeon: Will Gladis Norton, MD;  Location: [facility];  Service: Cardiovascular;  Laterality: N/A;   EXCISIONAL HEMORRHOIDECTOMY   02/20/1969   EYE SURGERY Left 06/22/1998   branch vein occlusion   EYE SURGERY Left ~ 2001   smoothed out wrinkle   LEFT HEART CATH AND CORONARY ANGIOGRAPHY N/A 05/11/2022   Procedure: LEFT HEART CATH AND CORONARY ANGIOGRAPHY;  Surgeon: Verlin Lonni BIRCH, MD;  Location: [facility];  Service: Cardiovascular;  Laterality: N/A;   LEFT OOPHORECTOMY  05/23/1979   nicked bowel, peritonitis, colostomy; colostomy reversed 1981    LOWER EXTREMITY ANGIOGRAM N/A 01/03/2014   Procedure: LOWER EXTREMITY ANGIOGRAM;  Surgeon: Deatrice DELENA Cage, MD;  Location: [facility] CATH [facility];  Service: Cardiovascular;  Laterality: N/A;   Nuclear med stress test  10/21/2011   Small area of mild ischemia inferoapically.   PARTIAL HYSTERECTOMY  02/20/1969   left ovaries, then ovaries removed later due tumors    POLYPECTOMY  06/02/2021   Procedure: POLYPECTOMY;  Surgeon: Cindie Carlin POUR, DO;  Location: [facility];  Service: Endoscopy;;   right eye surgery  03/2021   RIGHT OOPHORECTOMY  02/20/1969   TEE WITHOUT CARDIOVERSION N/A 05/12/2022   Procedure: TRANSESOPHAGEAL ECHOCARDIOGRAM (TEE);  Surgeon: Shlomo Wilbert SAUNDERS, MD;  Location: [facility] ENDOSCOPY;  Service: Cardiovascular;  Laterality: N/A;   TEE WITHOUT CARDIOVERSION N/A 08/14/2022   Procedure: TRANSESOPHAGEAL ECHOCARDIOGRAM;  Surgeon: Norton Soyla Gladis, MD;  Location: [facility] INVASIVE CV LAB;  Service: Cardiovascular;  Laterality: N/A;   TEE WITHOUT CARDIOVERSION N/A 12/08/2022   Procedure: TRANSESOPHAGEAL ECHOCARDIOGRAM;  Surgeon: Norton Soyla Gladis, MD;  Location: [facility] INVASIVE CV LAB;  Service: Cardiovascular;  Laterality: N/A;   TRANSESOPHAGEAL ECHOCARDIOGRAM (CATH LAB) N/A 12/22/2023   Procedure: TRANSESOPHAGEAL ECHOCARDIOGRAM;  Surgeon: Norton Soyla Gladis, MD;  Location: [facility] INVASIVE CV LAB;  Service: Cardiovascular;  Laterality: N/A;    FAMILY HISTORY: Family History  Problem Relation Age of Onset   Heart disease Mother        deceased   Heart  disease Father        deceased, heart disease   Diabetes Brother    Heart disease Brother    Thyroid  disease Brother    Heart disease Sister    Heart disease Brother    Thyroid  disease Brother    Lupus Daughter    Colon cancer Neg Hx     SOCIAL HISTORY: Social History   Socioeconomic History   Marital status: Married    Spouse name: Not on file   Number of children: Not on file   Years of education: Not on file   Highest education level: Not on file  Occupational History   Occupation: Retired    Associate Professor: RETIRED    Comment: insurance billing  Tobacco Use   Smoking status: Never    Passive exposure: Never   Smokeless tobacco: Never   Tobacco comments:  Never smoke 10/08/22  Vaping Use   Vaping status: Never Used  Substance and Sexual Activity   Alcohol use: No    Alcohol/week: 0.0 standard drinks of alcohol   Drug use: No   Sexual activity: Never    Birth control/protection: Surgical    Comment: hyst  Other Topics Concern   Not on file  Social History Narrative   Not on file   Social Drivers of Health   Tobacco Use: Low Risk (07/04/2024)   Patient History    Smoking Tobacco Use: Never    Smokeless Tobacco Use: Never    Passive Exposure: Never  Financial Resource Strain: Not on file  Food Insecurity: No Food Insecurity (04/13/2023)   Hunger Vital Sign    Worried About Running Out of Food in the Last Year: Never true    Ran Out of Food in the Last Year: Never true  Transportation Needs: No Transportation Needs (04/13/2023)   PRAPARE - Administrator, Civil Service (Medical): No    Lack of Transportation (Non-Medical): No  Physical Activity: Not on file  Stress: Not on file  Social Connections: Not on file  Intimate Partner Violence: Not At Risk (04/13/2023)   Humiliation, Afraid, Rape, and Kick questionnaire    Fear of Current or Ex-Partner: No    Emotionally Abused: No    Physically Abused: No    Sexually Abused: No  Depression  (PHQ2-9): Not on file  Alcohol Screen: Not on file  Housing: Low Risk (04/13/2023)   Housing    Last Housing Risk Score: 0  Utilities: Not At Risk (04/13/2023)   AHC Utilities    Threatened with loss of utilities: No  Health Literacy: Not on file     PHYSICAL EXAM  Vitals:   07/04/24 1108  BP: 134/82  Pulse: 66  Weight: 132 lb (59.9 kg)  Height: 5' 3 (1.6 m)     Body mass index is 23.38 kg/m.  Generalized: Well developed, in no acute distress  Cardiology: normal rate and rhythm, no murmur noted Respiratory: clear to auscultation bilaterally  Neurological examination  Mentation: Alert oriented to time, place, history taking. Follows all commands speech and language fluent Cranial nerve II-XII: Pupils were equal round reactive to light. Extraocular movements were full, visual field were full  Motor: The motor testing reveals 5 over 5 strength of all 4 extremities. Good symmetric motor tone is noted throughout. No tremor noted  Gait and station: Gait is normal.    DIAGNOSTIC DATA (LABS, IMAGING, TESTING) - I reviewed patient records, labs, notes, testing and imaging myself where available.      No data to display           Lab Results  Component Value Date   WBC 7.5 11/25/2023   HGB 12.8 11/25/2023   HCT 40.1 11/25/2023   MCV 94 11/25/2023   PLT 209 11/25/2023      Component Value Date/Time   NA 142 11/25/2023 0855   K 4.6 11/25/2023 0855   CL 106 11/25/2023 0855   CO2 22 11/25/2023 0855   GLUCOSE 170 (H) 11/25/2023 0855   GLUCOSE 241 (H) 07/27/2023 1108   BUN 19 11/25/2023 0855   CREATININE 1.35 (H) 11/25/2023 0855   CREATININE 1.11 (H) 12/31/2017 0850   CALCIUM  9.1 11/25/2023 0855   PROT 6.1 03/23/2023 1135   ALBUMIN 4.1 03/23/2023 1135   AST 22 03/23/2023 1135   ALT 22 03/23/2023 1135   ALKPHOS 89 03/23/2023 1135  BILITOT 0.6 03/23/2023 1135   GFRNONAA 34 (L) 07/27/2023 1108   GFRNONAA 43 (L) 08/05/2017 0807   GFRAA 39 (L) 08/26/2019 0901    GFRAA 50 (L) 08/05/2017 0807   Lab Results  Component Value Date   CHOL 96 05/10/2022   HDL 30 (L) 05/10/2022   LDLCALC 35 05/10/2022   TRIG 156 (H) 05/10/2022   CHOLHDL 3.2 05/10/2022   Lab Results  Component Value Date   HGBA1C 8.3 (A) 04/05/2024   Lab Results  Component Value Date   VITAMINB12 401 03/23/2023   Lab Results  Component Value Date   TSH 2.57 04/05/2024     ASSESSMENT AND PLAN 79 y.o. 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 (02/27/2020), Essential hypertension, GERD (gastroesophageal reflux disease), History of blood transfusion (1980's), Hypercholesteremia, Hypothyroidism, Myocardial infarction (HCC) (02/2012), NSTEMI (non-ST elevated myocardial infarction) (HCC) (04/02/2015), OSA (obstructive sleep apnea) (05/13/2016), Ovarian tumor, PAD (peripheral artery disease), Pericardial effusion, Posterior vitreous detachment of right eye (10/11/2019), Posterior vitreous detachment of right eye (10/11/2019), Presence of permanent cardiac pacemaker, Retinal microaneurysm of right eye (10/11/2019), S/P pericardiocentesis (06/28/2016), 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 here with     ICD-10-CM   1.  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 Paroxysmal atrial fibrillation (HCC)  I48.0     3. OSA on CPAP  G47.33     4. Restless legs  G25.81        Virginia Huber is doing well on CPAP therapy. Compliance report reveals acceptable compliance. She was encouraged to continue using CPAP nightly and for greater than 4 hours each night. We will update supply orders as indicated. I will update HST and order new CPAP pending results. Risks of untreated sleep apnea review and education materials provided. She will reduce dose of ropinirole  to 0.25mg  daily for 4 weeks. May continue to wean if she feels she is doing well. Healthy lifestyle habits encouraged. She will follow up in 1 year, sooner if needed. She verbalizes understanding and agreement with this plan.    No orders of the defined types were placed in this encounter.     Meds ordered this encounter  Medications   rOPINIRole  (REQUIP ) 0.25 MG tablet    Sig: Take 1 tablet (0.25 mg total) by mouth 3 (three) times daily.    Dispense:  90 tablet    Refill:  3    Supervising Provider:   YAN, YIJUN [[id number]]       Enslee Bibbins, FNP-C 07/04/2024, 11:30 AM [facility] [address], [address] [place], [place] [address] [phone]  "

## 2024-07-04 NOTE — Patient Instructions (Signed)
 Below is our plan:  We will reduce dose of ropinirole  to 0.25mg  daily for 4 weeks. If doing well we can continue to wean. Just let me know.  Please continue using your CPAP regularly. While your insurance requires that you use CPAP at least 4 hours each night on 70% of the nights, I recommend, that you not skip any nights and use it throughout the night if you can. Getting used to CPAP and staying with the treatment long term does take time and patience and discipline. Untreated obstructive sleep apnea when it is moderate to severe can have an adverse impact on cardiovascular health and raise her risk for heart disease, arrhythmias, hypertension, congestive heart failure, stroke and diabetes. Untreated obstructive sleep apnea causes sleep disruption, nonrestorative sleep, and sleep deprivation. This can have an impact on your day to day functioning and cause daytime sleepiness and impairment of cognitive function, memory loss, mood disturbance, and problems focussing. Using CPAP regularly can improve these symptoms.  We will update supply orders, today.   Please make sure you are staying well hydrated. I recommend 50-60 ounces daily. Well balanced diet and regular exercise encouraged. Consistent sleep schedule with 6-8 hours recommended.   Please continue follow up with care team as directed.   Follow up with me in 1 year   You may receive a survey regarding today's visit. I encourage you to leave honest feed back as I do use this information to improve patient care. Thank you for seeing me today!

## 2024-07-05 ENCOUNTER — Ambulatory Visit (INDEPENDENT_AMBULATORY_CARE_PROVIDER_SITE_OTHER)

## 2024-07-05 DIAGNOSIS — I495 Sick sinus syndrome: Secondary | ICD-10-CM | POA: Diagnosis not present

## 2024-07-06 ENCOUNTER — Encounter: Payer: Self-pay | Admitting: Internal Medicine

## 2024-07-06 ENCOUNTER — Ambulatory Visit: Admitting: Internal Medicine

## 2024-07-06 VITALS — BP 138/60 | HR 88 | Ht 63.0 in | Wt 132.2 lb

## 2024-07-06 DIAGNOSIS — Z7985 Long-term (current) use of injectable non-insulin antidiabetic drugs: Secondary | ICD-10-CM

## 2024-07-06 DIAGNOSIS — Z794 Long term (current) use of insulin: Secondary | ICD-10-CM

## 2024-07-06 DIAGNOSIS — E785 Hyperlipidemia, unspecified: Secondary | ICD-10-CM | POA: Diagnosis not present

## 2024-07-06 DIAGNOSIS — E039 Hypothyroidism, unspecified: Secondary | ICD-10-CM | POA: Diagnosis not present

## 2024-07-06 DIAGNOSIS — E1159 Type 2 diabetes mellitus with other circulatory complications: Secondary | ICD-10-CM

## 2024-07-06 DIAGNOSIS — E1165 Type 2 diabetes mellitus with hyperglycemia: Secondary | ICD-10-CM | POA: Diagnosis not present

## 2024-07-06 LAB — CUP PACEART REMOTE DEVICE CHECK
Battery Remaining Longevity: 26 mo
Battery Voltage: 2.93 V
Brady Statistic AP VP Percent: 0.37 %
Brady Statistic AP VS Percent: 68.95 %
Brady Statistic AS VP Percent: 0.06 %
Brady Statistic AS VS Percent: 30.62 %
Brady Statistic RA Percent Paced: 68.42 %
Brady Statistic RV Percent Paced: 0.49 %
Date Time Interrogation Session: 20260114191337
Implantable Lead Connection Status: 753985
Implantable Lead Connection Status: 753985
Implantable Lead Implant Date: 20180104
Implantable Lead Implant Date: 20180104
Implantable Lead Location: 753859
Implantable Lead Location: 753860
Implantable Lead Model: 5076
Implantable Lead Model: 5076
Implantable Pulse Generator Implant Date: 20180104
Lead Channel Impedance Value: 323 Ohm
Lead Channel Impedance Value: 361 Ohm
Lead Channel Impedance Value: 380 Ohm
Lead Channel Impedance Value: 456 Ohm
Lead Channel Pacing Threshold Amplitude: 0.625 V
Lead Channel Pacing Threshold Amplitude: 1.125 V
Lead Channel Pacing Threshold Pulse Width: 0.4 ms
Lead Channel Pacing Threshold Pulse Width: 0.4 ms
Lead Channel Sensing Intrinsic Amplitude: 1.75 mV
Lead Channel Sensing Intrinsic Amplitude: 1.75 mV
Lead Channel Sensing Intrinsic Amplitude: 16.5 mV
Lead Channel Sensing Intrinsic Amplitude: 16.5 mV
Lead Channel Setting Pacing Amplitude: 2 V
Lead Channel Setting Pacing Amplitude: 2.5 V
Lead Channel Setting Pacing Pulse Width: 0.4 ms
Lead Channel Setting Sensing Sensitivity: 2.8 mV
Zone Setting Status: 755011

## 2024-07-06 LAB — POCT GLYCOSYLATED HEMOGLOBIN (HGB A1C): Hemoglobin A1C: 8.2 % — AB (ref 4.0–5.6)

## 2024-07-06 NOTE — Patient Instructions (Addendum)
 Please continue: - Ozempic  1 mg weekly  - Lantus  24 units in am  Change: - Lyumjev   8-10 units before B 4-6 units before L 8-10 units before D  Continue Levothyroxine  112 mcg daily.  Take the thyroid  hormone every day, with water, at least 30 minutes before breakfast, separated by at least 4 hours from: - acid reflux medications - calcium  - iron - multivitamins  Please return in 3-4 months.

## 2024-07-06 NOTE — Progress Notes (Signed)
 Patient ID: Virginia Huber, female   DOB: January 04, 1946, 79 y.o.   MRN: [id number]   HPI: Virginia Huber is a 79 y.o.-year-old female, initially referred by her PCP, Dr.Golding, returning for follow-up for DM2, dx in ~2005, insulin -dependent since 2006, uncontrolled, with complications (CAD, s/p NSTEMI, s/p PTCA and DES; CHF; PAF/flutter; PAD; CKD; cerebrovascular disease - s/p TIA; PN).  She previously saw my colleague, Dr. Kassie.  Our last visit was 3 months ago.  Interim history: No increased urination, nausea, chest pain mention.   DM2: Reviewed HbA1c levels: Lab Results  Component Value Date   HGBA1C 8.3 (A) 04/05/2024   HGBA1C 7.1 (A) 12/03/2023   HGBA1C 7.5 (A) 08/04/2023   HGBA1C 6.4 (A) 02/01/2023   HGBA1C 5.8 (A) 07/15/2022   HGBA1C 6.4 (H) 05/10/2022   HGBA1C 6.0 (A) 02/09/2022   HGBA1C 6.7 (A) 10/09/2021   HGBA1C 7.5 (A) 06/06/2021   HGBA1C 7.7 (A) 02/04/2021   Previously on: - NPH/R 70/30 20-22 >> 28 units in am and 28-30 before dinner >> ...>> NPH 15 units in am and 8-10  before dinner >> 5 units at bedtime - Trulicity  3 mg weekly >> Ozempic  2 mg weekly -started 05/2021 >> 1 mg weekly (off x 2 weeks). No FH of MTC or personal h/o pancreatitis. She was on Metformin  >> stopped b/c CKD.  Currently on: - Lantus  20 >> 24 units in am - Lyumjev  4-6 >> 4 >> 4-6 >> 6 >> 6-8 units before brunch and dinner >> 8-10 units before B 0 units before L 8-10 units before D - Ozempic  1 mg in am  Pt checks her sugars 4x a day with the Libre CGM (with receiver):  Previously:  Previously:  Lowest sugar was  60 >> 54 >> >70 >> 59; she has hypoglycemia awareness in the 70s.  Highest sugar was  319 >> 268 >> 500s (steroids) >> 200s. She had a steroid injection in 01/2024 for a ruptured Baker's cyst.  Sugars increased to 500 and stayed elevated for approximately 2 weeks.    Glucometer: Freestyle lite  Patient starting to improve her meals after her last HbA1c returned: - Brunch:  scrambled egg + bacon + rye bread or cheerios + skim milk - Dinner: meat - grilled + veggies - Snacks: seldom - apple Drinks occasional diet drinks  -+ CKD-sees nephrology: Dr. Rachele, last BUN/creatinine:  Lab Results  Component Value Date   BUN 19 11/25/2023   BUN 28 (H) 10/05/2023   CREATININE 1.35 (H) 11/25/2023   CREATININE 1.66 (H) 10/05/2023  On Benicar  40.  -+ HL; last set of lipids: 08/05/2023: 135/97/41/76 Lab Results  Component Value Date   CHOL 96 05/10/2022   HDL 30 (L) 05/10/2022   LDLCALC 35 05/10/2022   TRIG 156 (H) 05/10/2022   CHOLHDL 3.2 05/10/2022  On Lipitor  80.  - last eye exam was on 03/20/2024: Severe NPDR + macular edema OD. Dr Elner.  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 She lost vision in L eye.  She has a history of BRAO.  She had eye surgery 03/2021 >> vision is better.  - she has numbness and tingling in her feet.  Latest foot exam was performed by podiatry-02/24/2024 - Dr. Loreda  Pt has FH of DM in M and her family.  Hypothyroidism:  Pt is on levothyroxine   112 mcg daily (dose decreased to 08/2021), taken: - in am - fasting - at least 30 min from b'fast - +  calcium  in the morning >> now at night - no iron - no multivitamins - no PPIs - not on Biotin  TSH levels were reviewed: Lab Results  Component Value Date   TSH 2.57 04/05/2024   TSH 1.170 03/23/2023   TSH 2.100 09/25/2022   TSH 0.571 03/17/2022   TSH 1.18 10/09/2021   TSH 0.183 (L) 08/25/2021   TSH 0.40 02/04/2021   TSH 0.22 (L) 10/22/2020   TSH 0.233 (L) 08/22/2020   TSH 2.410 02/22/2020  08/05/2023: TSH 4.510 (0.45-4.5)  She also has a history of heart block, sick sinus syndrome, pericardial effusion (history of tamponade).  She has Afib  - was hospitalized in 04/2022 for cardioversion.  She had a cardiac ablation in 07/2022, then cardioversion in 09/2022, and then again an ablation in 11/2022. She had her third cardiac ablation  in 12/2023.  ROS: +  see HPI  I reviewed pt's medications, allergies, PMH, social hx, family hx, and changes were documented in the history of present illness. Otherwise, unchanged from my initial visit note.  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 h/o PAF/flutter during admission in 2013 for PNA. b. PAF during adm for NSTEMI 07/2015, subsequent paroxysms since then.   B12 deficiency anemia    Branch retinal vein occlusion with macular edema of right eye (HCC) 10/11/2019   Chronic renal failure, stage 3b (HCC) 09/23/2017   Coronary artery disease 11/30/2014   a. remote MI. b. h/o PTCA with scoring balloon to OM1 11/2014. c. NSTEMI 03/2015 s/p DES to prox-mid Cx. d. 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 s/p LE angio 2015; followed by Dr. Darron - managed medically.   Pericardial effusion    a. 06/2016 after ppm - s/p pericardiocentesis.   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 s/p Medtr[employer] PPM 06/2016, c/b lead perf/pericardial effusion.   TIA (transient ischemic attack)    Type 2 diabetes with nephropathy (HCC) 02/29/2012   Past Surgical History:  Procedure Laterality Date   A-FLUTTER ABLATION N/A 12/08/2022   Procedure: A-FLUTTER ABLATION;  Surgeon: Inocencio Soyla Lunger, MD;  Location: [facility]ASIVE [facility];  Service: Cardiovascular;  Laterality: N/A;   ABDOMINAL AORTAGRAM N/A 01/03/2014   Procedure: ABDOMINAL EZELLA;  Surgeon: Deatrice DELENA Darron, MD;  Location: [facility] CATH [facility];  Service: Cardiovascular;  Laterality: N/A;   ABDOMINAL HYSTERECTOMY  06/22/1970   partial   APPENDECTOMY  02/20/1969   ATRIAL FIBRILLATION ABLATION N/A 08/14/2022   Procedure: ATRIAL FIBRILLATION ABLATION;  Surgeon: Inocencio Soyla Lunger, MD;  Location: [facility];  Service: Cardiovascular;  Laterality: N/A;   ATRIAL FIBRILLATION ABLATION N/A 12/08/2022   Procedure: ATRIAL FIBRILLATION ABLATION;  Surgeon: Inocencio Soyla Lunger, MD;  Location: [facility];  Service: Cardiovascular;  Laterality: N/A;   ATRIAL FIBRILLATION ABLATION N/A 12/22/2023   Procedure: ATRIAL FIBRILLATION ABLATION;  Surgeon: Inocencio Soyla Lunger, MD;  Location: [facility];  Service: Cardiovascular;  Laterality: N/A;   CARDIAC CATHETERIZATION  06/22/2006   Tiny OM-2 with 90% narrowing. Med tx.   CARDIAC CATHETERIZATION N/A 11/30/2014   Procedure: Left Heart Cath and Coronary Angiography;  Surgeon: Debby DELENA Sor, MD; LAD 20%, CFX 50%, OM1 95%, right PLB 30%, LV normal    CARDIAC CATHETERIZATION N/A 11/30/2014   Procedure: Coronary Balloon Angioplasty;  Surgeon: Debby DELENA Sor, MD;  Angiosculpt scoring balloon and PTCA to the OM1 reducing stenosis from 95% to less than 10%   CARDIAC CATHETERIZATION N/A 04/03/2015   Procedure: Left  Heart Cath and Coronary Angiography;  Surgeon: Toribio JONELLE Fuel, MD; dLAD 50%, CFX 90%, OM1 100%, PLA 15%, LVEDP 13     CARDIAC CATHETERIZATION N/A 04/03/2015   Procedure: Coronary Stent Intervention;  Surgeon: Ozell Fell, MD; 3.0x18 mm Xience DES to the CFX     CARDIAC CATHETERIZATION N/A 08/02/2015   Procedure: Left Heart Cath and Coronary Angiography;  Surgeon: Debby DELENA Sor, MD;  Location: [facility] INVASIVE CV LAB;  Service: Cardiovascular;  Laterality: N/A;   CARDIAC CATHETERIZATION N/A 08/02/2015   Procedure: Coronary Stent Intervention;  Surgeon: Debby DELENA Sor, MD;  Location: [facility];  Service: Cardiovascular;  Laterality: N/A;   CARDIAC CATHETERIZATION N/A 06/25/2016   Procedure: Pericardiocentesis;  Surgeon: Will Lunger Inocencio, MD;  Location: [facility];  Service: Cardiovascular;  Laterality: N/A;   cardiac stents     CARDIOVERSION N/A 12/15/2017   Procedure: CARDIOVERSION;  Surgeon: Alveta Aleene PARAS, MD;  Location: [facility] ENDOSCOPY;  Service: Cardiovascular;  Laterality: N/A;   CARDIOVERSION N/A 05/12/2022   Procedure: CARDIOVERSION;  Surgeon: Shlomo Wilbert JONELLE, MD;  Location: [facility] ENDOSCOPY;  Service: Cardiovascular;  Laterality: N/A;   CARDIOVERSION N/A 09/30/2022   Procedure: CARDIOVERSION;  Surgeon: Francyne Headland, MD;  Location: [facility];  Service: Cardiovascular;  Laterality: N/A;   CARDIOVERSION N/A 10/29/2022   Procedure: CARDIOVERSION;  Surgeon: Shlomo Wilbert JONELLE, MD;  Location: [facility];  Service: Cardiovascular;  Laterality: N/A;   CARDIOVERSION N/A 08/16/2023   Procedure: CARDIOVERSION;  Surgeon: Delford Maude BROCKS, MD;  Location: [facility];  Service: Cardiovascular;  Laterality: N/A;   CHOLECYSTECTOMY OPEN  02/20/1989   COLONOSCOPY  06/23/2003   Dr. Golda: pancolonic divericula, polyp, path unknown currently   COLONOSCOPY  06/22/2010   Dr. Harvey: Normal TI, scattered diverticula in entire colon, small internal hemorrhoids, normal  colon biopsies. Colonoscopy in 5-10 years.    COLONOSCOPY WITH PROPOFOL  N/A 06/02/2021   pancolonic diverticulosis. Two 4-6 mm polyps in transverse colon. Sessile serrated and hyperplastic. 5 year surveillance if benefits outweigh the risks.   COLOSTOMY  05/23/1979   COLOSTOMY REVERSAL  11/21/1979   EP IMPLANTABLE DEVICE N/A 06/25/2016   Procedure: Lead Revision/Repair;  Surgeon: Will Lunger Inocencio, MD;  Location: [facility];  Service: Cardiovascular;  Laterality: N/A;  EP IMPLANTABLE DEVICE N/A 06/25/2016   Procedure: Pacemaker Implant;  Surgeon: Will Gladis Norton, MD;  Location: [facility] CV LAB;  Service: Cardiovascular;  Laterality: N/A;   EXCISIONAL HEMORRHOIDECTOMY  02/20/1969   EYE SURGERY Left 06/22/1998   branch vein occlusion   EYE SURGERY Left ~ 2001   smoothed out wrinkle   LEFT HEART CATH AND CORONARY ANGIOGRAPHY N/A 05/11/2022   Procedure: LEFT HEART CATH AND CORONARY ANGIOGRAPHY;  Surgeon: Verlin Lonni BIRCH, MD;  Location: [facility];  Service: Cardiovascular;  Laterality: N/A;   LEFT OOPHORECTOMY  05/23/1979   nicked bowel, peritonitis, colostomy; colostomy reversed 1981    LOWER EXTREMITY ANGIOGRAM N/A 01/03/2014   Procedure: LOWER EXTREMITY ANGIOGRAM;  Surgeon: Deatrice DELENA Cage, MD;  Location: [facility] CATH LAB;  Service: Cardiovascular;  Laterality: N/A;   Nuclear med stress test  10/21/2011   Small area of mild ischemia inferoapically.   PARTIAL HYSTERECTOMY  02/20/1969   left ovaries, then ovaries removed later due tumors    POLYPECTOMY  06/02/2021   Procedure: POLYPECTOMY;  Surgeon: Cindie Carlin POUR, DO;  Location: [facility];  Service: Endoscopy;;   right eye surgery  03/2021   RIGHT OOPHORECTOMY  02/20/1969   TEE WITHOUT CARDIOVERSION N/A 05/12/2022   Procedure: TRANSESOPHAGEAL ECHOCARDIOGRAM (TEE);  Surgeon: Shlomo Wilbert SAUNDERS, MD;  Location: [facility] ENDOSCOPY;  Service: Cardiovascular;  Laterality: N/A;   TEE WITHOUT CARDIOVERSION N/A  08/14/2022   Procedure: TRANSESOPHAGEAL ECHOCARDIOGRAM;  Surgeon: Norton Soyla Gladis, MD;  Location: [facility] INVASIVE CV LAB;  Service: Cardiovascular;  Laterality: N/A;   TEE WITHOUT CARDIOVERSION N/A 12/08/2022   Procedure: TRANSESOPHAGEAL ECHOCARDIOGRAM;  Surgeon: Norton Soyla Gladis, MD;  Location: [facility] INVASIVE CV LAB;  Service: Cardiovascular;  Laterality: N/A;   TRANSESOPHAGEAL ECHOCARDIOGRAM (CATH LAB) N/A 12/22/2023   Procedure: TRANSESOPHAGEAL ECHOCARDIOGRAM;  Surgeon: Norton Soyla Gladis, MD;  Location: [facility] INVASIVE CV LAB;  Service: Cardiovascular;  Laterality: N/A;   Social History   Socioeconomic History   Marital status: Married    Spouse name: Not on file   Number of children: Not on file   Years of education: Not on file   Highest education level: Not on file  Occupational History   Occupation: Retired    Associate Professor: RETIRED    Comment: insurance billing  Tobacco Use   Smoking status: Never    Passive exposure: Never   Smokeless tobacco: Never   Tobacco comments:    Never smoke 10/08/22  Vaping Use   Vaping status: Never Used  Substance and Sexual Activity   Alcohol use: No    Alcohol/week: 0.0 standard drinks of alcohol   Drug use: No   Sexual activity: Never    Birth control/protection: Surgical    Comment: hyst  Other Topics Concern   Not on file  Social History Narrative   Not on file   Social Drivers of Health   Tobacco Use: Low Risk (07/04/2024)   Patient History    Smoking Tobacco Use: Never    Smokeless Tobacco Use: Never    Passive Exposure: Never  Financial Resource Strain: Not on file  Food Insecurity: No Food Insecurity (04/13/2023)   Hunger Vital Sign    Worried About Running Out of Food in the Last Year: Never true    Ran Out of Food in the Last Year: Never true  Transportation Needs: No Transportation Needs (04/13/2023)   PRAPARE - Administrator, Civil Service (Medical): No    Lack of Transportation (Non-Medical):  No   Physical Activity: Not on file  Stress: Not on file  Social Connections: Not on file  Intimate Partner Violence: Not At Risk (04/13/2023)   Humiliation, Afraid, Rape, and Kick questionnaire    Fear of Current or Ex-Partner: No    Emotionally Abused: No    Physically Abused: No    Sexually Abused: No  Depression (PHQ2-9): Not on file  Alcohol Screen: Not on file  Housing: Low Risk (04/13/2023)   Housing    Last Housing Risk Score: 0  Utilities: Not At Risk (04/13/2023)   AHC Utilities    Threatened with loss of utilities: No  Health Literacy: Not on file   Current Outpatient Medications on File Prior to Visit  Medication Sig Dispense Refill   acetaminophen  (TYLENOL ) 500 MG tablet Take 1,000 mg by mouth every 8 (eight) hours as needed for headache, moderate pain or mild pain. (Patient taking differently: Take 1,000 mg by mouth as needed for headache, moderate pain (pain score 4-6) or mild pain (pain score 1-3).)     ALPRAZolam  (XANAX ) 0.25 MG tablet TAKE 1/2-1 TABLET BY MOUTH AT BEDTIME AS NEEDED FOR SLEEP 30 tablet 0   apixaban  (ELIQUIS ) 5 MG TABS tablet Take 1 tablet (5 mg total) by mouth 2 (two) times daily. 180 tablet 1   atorvastatin  (LIPITOR ) 80 MG tablet TAKE ONE TABLET BY MOUTH EVERY DAY AT 6:00PM 90 tablet 3   calcitRIOL  (ROCALTROL ) 0.25 MCG capsule Take 0.25 mcg by mouth every Monday, Wednesday, and Friday.     carvedilol  (COREG ) 25 MG tablet TAKE ONE TABLET BY MOUTH TWICE DAILY 180 tablet 3   Cholecalciferol  50 MCG (2000 UT) TABS Take 2,000 Units by mouth at bedtime.     clopidogrel  (PLAVIX ) 75 MG tablet TAKE ONE TABLET (75MG  TOTAL) BY MOUTH DAILY 90 tablet 3   Continuous Blood Gluc Receiver (FREESTYLE LIBRE 2 READER) DEVI 1 each by Does not apply route daily. 1 each 0   Continuous Glucose Sensor (FREESTYLE LIBRE 2 PLUS SENSOR) MISC Inject 1 each into the skin as directed. Change sensor every 15 days 6 each 3   cyanocobalamin  2000 MCG tablet Take 2,000 mcg by mouth daily.      diltiazem  (CARDIZEM  CD) 180 MG 24 hr capsule Take 1 capsule (180 mg total) by mouth daily. 90 capsule 1   furosemide  (LASIX ) 40 MG tablet Take 40 mg by mouth daily as needed for fluid or edema.     glucose blood (FREESTYLE PRECISION NEO TEST) test strip Use as instructed 100 each 3   insulin  glargine (LANTUS  SOLOSTAR) 100 UNIT/ML Solostar Pen Inject 20-24 Units into the skin daily.     Insulin  Lispro-aabc (LYUMJEV  KWIKPEN) 100 UNIT/ML KwikPen Inject 6-10 units under skin before the 2 main meals of the day     levothyroxine  (SYNTHROID ) 112 MCG tablet TAKE ONE TABLET BY MOUTH ONCE DAILY 90 tablet 3   nitroGLYCERIN  (NITROSTAT ) 0.4 MG SL tablet Place 1 tablet (0.4 mg total) under the tongue every 5 (five) minutes x 3 doses as needed for chest pain (If not relief after 3rd dose, call 911 or go to ED). 25 tablet 0   olmesartan  (BENICAR ) 5 MG tablet Take 2 tablets (10 mg total) by mouth 2 (two) times daily. 360 tablet 2   OZEMPIC , 1 MG/DOSE, 4 MG/3ML SOPN INJECT 1MG  INTO THE SKIN ONCE A WEEK 3 mL 2   rOPINIRole  (REQUIP ) 0.25 MG tablet Take 1 tablet (0.25 mg total) by mouth 3 (three) times  daily. 90 tablet 3   traMADol  (ULTRAM ) 50 MG tablet Take 1 tablet (50 mg total) by mouth every 12 (twelve) hours as needed for severe pain (pain score 7-10). (Patient not taking: Reported on 07/04/2024) 10 tablet 0   No current facility-administered medications on file prior to visit.   Allergies  Allergen Reactions   Penicillins Hives   Percocet [Oxycodone-Acetaminophen ] Nausea And Vomiting   Family History  Problem Relation Age of Onset   Heart disease Mother        deceased   Heart disease Father        deceased, heart disease   Diabetes Brother    Heart disease Brother    Thyroid  disease Brother    Heart disease Sister    Heart disease Brother    Thyroid  disease Brother    Lupus Daughter    Colon cancer Neg Hx    PE: BP 138/60   Pulse 88   Ht 5' 3 (1.6 m)   Wt 132 lb 3.2 oz (60 kg)   SpO2 97%    BMI 23.42 kg/m  Wt Readings from Last 3 Encounters:  07/06/24 132 lb 3.2 oz (60 kg)  07/04/24 132 lb (59.9 kg)  04/05/24 130 lb 12.8 oz (59.3 kg)   Constitutional: normal weight, in NAD Eyes: no exophthalmos ENT: no masses palpated in neck, no cervical lymphadenopathy Cardiovascular: RRR, No MRG Respiratory: CTA B Musculoskeletal: no deformities Skin: no rashes Neurological: + tremor with outstretched hands - L>R  ASSESSMENT: 1. DM2, insulin -dependent, now more controlled, withcomplications - CAD, s/p NSTEMI 07/2015 and 03/2015, s/p PTCA and DES - CHF - PAF/flutter - PAD - cerebrovascular disease - s/p TIA - CKD  2. HL  3.  Hypothyroidism  PLAN:  1. Patient with longstanding, previously uncontrolled type 2 diabetes, with initially improved control on a weekly GLP-1 receptor agonist along with basal-bolus insulin  regimen, but with worsening control at last visit, with an HbA1c returned at 8.3%, increased from 7.1%.  Sugars appears to be fluctuating higher in the target range and increases significantly above target after breakfast.  Sugars then were improving but still fluctuating around 180 mg/dL in the late afternoon and the first half of the night.  We increased the Lantus  dose slightly and also I advised her to use higher doses of Lyumjev , mostly 8 units for breakfast and varying between 6 to 8 units for dinner.  We did not change the Ozempic  dose. - we previously discussed about adding an SGLT2 inhibitor in the past but I advised her to check with her nephrologist first.  Dr. Rachele recommended to hold off using an SGLT2 inhibitor as her kidney function was trending down. CGM interpretation: -At today's visit, we reviewed her CGM downloads: It appears that 82% of values are in target range (goal >70%), while 17% are higher than 180 (goal <25%), and 1% are lower than 70 (goal <4%).  The calculated average blood sugar is 146.  The projected HbA1c for the next 3 months (GMI) is  6.8%. -Reviewing the CGM trends, sugars appear to be well-controlled, fluctuating within the target range with an increase in blood sugars after her lunch.  Upon questioning, she split her brunch into her breakfast and lunch but only taking the Lyumjev  before breakfast.  We discussed that she may need to take a lower dose before lunch, also.  Also, we discussed about how to adjust the dose of Lyumjev  based on the meal that she is planning to eat.  Last night, after a meal of the beef and toast, she dropped her blood sugars too low, 59.  Upon questioning, she took 10 units of Lyumjev  for this meal.  We discussed that for a low-carb, high fat meal, she may need to take less insulin , 6 to 8 units.  I did not suggest other changes in regimen for now. - I suggested to:  Patient Instructions  Please continue: - Ozempic  1 mg weekly  - Lantus  24 units in am  Change: - Lyumjev   8-10 units before B 4-6 units before L 8-10 units before D  Continue Levothyroxine  112 mcg daily.  Take the thyroid  hormone every day, with water, at least 30 minutes before breakfast, separated by at least 4 hours from: - acid reflux medications - calcium  - iron - multivitamins  Please return in 3-4 months.  - we checked her HbA1c: 8.2% (slightly lower) - advised to check sugars at different times of the day - 4x a day, rotating check times - advised for yearly eye exams >> she is UTD - return to clinic in 3 months  2. HL - Reviewed latest lipid panel from 07/2023: LDL above our target of less than 55, but the rest of fractions at goal: 135/97/41/76 - She continues Lipitor  80 mg daily without side effects  3.  Hypothyroidism - latest thyroid  labs reviewed with pt. >> normal: Lab Results  Component Value Date   TSH 2.57 04/05/2024  - she continues on LT4 112 mcg daily - pt feels good on this dose. - we discussed about taking the thyroid  hormone every day, with water, >30 minutes before breakfast, separated by >4  hours from acid reflux medications, calcium , iron, multivitamins. Pt. is taking it correctly.   Virginia Fendt, MD PhD [facility] Endocrinology

## 2024-07-06 NOTE — Addendum Note (Signed)
 Addended by: CLEOTILDE ROLIN RAMAN on: 07/06/2024 08:45 AM   Modules accepted: Orders

## 2024-07-07 ENCOUNTER — Ambulatory Visit: Payer: Self-pay | Admitting: Cardiology

## 2024-07-11 ENCOUNTER — Ambulatory Visit: Attending: Nurse Practitioner | Admitting: Nurse Practitioner

## 2024-07-11 ENCOUNTER — Encounter: Payer: Self-pay | Admitting: Nurse Practitioner

## 2024-07-11 VITALS — BP 161/72 | HR 80 | Ht 63.0 in | Wt 134.0 lb

## 2024-07-11 DIAGNOSIS — I2510 Atherosclerotic heart disease of native coronary artery without angina pectoris: Secondary | ICD-10-CM

## 2024-07-11 DIAGNOSIS — I5020 Unspecified systolic (congestive) heart failure: Secondary | ICD-10-CM

## 2024-07-11 DIAGNOSIS — G4733 Obstructive sleep apnea (adult) (pediatric): Secondary | ICD-10-CM | POA: Diagnosis not present

## 2024-07-11 DIAGNOSIS — I739 Peripheral vascular disease, unspecified: Secondary | ICD-10-CM

## 2024-07-11 DIAGNOSIS — I951 Orthostatic hypotension: Secondary | ICD-10-CM | POA: Diagnosis not present

## 2024-07-11 DIAGNOSIS — Z950 Presence of cardiac pacemaker: Secondary | ICD-10-CM

## 2024-07-11 DIAGNOSIS — I10 Essential (primary) hypertension: Secondary | ICD-10-CM | POA: Diagnosis not present

## 2024-07-11 DIAGNOSIS — I4819 Other persistent atrial fibrillation: Secondary | ICD-10-CM

## 2024-07-11 DIAGNOSIS — I4892 Unspecified atrial flutter: Secondary | ICD-10-CM

## 2024-07-11 NOTE — Progress Notes (Signed)
 Remote PPM Transmission

## 2024-07-11 NOTE — Progress Notes (Signed)
 " Cardiology Office Note:    Date: 07/11/2024 ID:  Virginia Huber, DOB [birth year], MRN [id number] PCP:  Marvine Rush, MD [facility] Providers Cardiologist:  Jayson Sierras, MD Electrophysiologist:  Will Gladis Norton, MD  Referring MD: Marvine Rush, MD   CC: Here for follow-up  History of Present Illness:    Virginia Huber is a 79 y.o. female with a PMH of CAD, s/p angioplasty to OM1 in 2016, DES to Cx in 2016, and DES to RCA in 2017, PAF, PAD, HTN, HFpEF, OSA, CKD stage 3b (Follows Nephrology), SSS, s/p Medtr[employer] PPM in 2018, and hx of orthostatic hypotension (past hx of fall), who presents today for scheduled follow-up.  Seen by Dr. Sierras 02/2022.  Was reportedly doing much better, denied any palpitations.  Saw Dr. Norton later that month and reported orthostatic dizziness a few times/week, also occurred at other times as well. Was holding medicines, somewhat helped.  SBP's later on were averaging 170s, referred to hypertension clinic. Amio seemed to make tremors worse, but was willing to continue if Multaq  was unaffordable.  Visited ED with substernal CP 04/2022, was SHOB/ with diaphoresis. Had a mechanical fall 5 days PTA, went to UC due to L-sided rib pain, x-rays negative. Troponin 20 >> 24.  CXR negative.  EKG revealed ST, with IVCD.  Transferred to [facility]. Underwent LHC that revealed mild to moderate diffuse disease of LAD without focal stenosis, patent stents, with chronic occlusion of small caliber first OM branch. Small caliber distal PL artery beyond the stented segment, moderately severe disease at bifurcation, vessels were too small for PCI.  Medical management recommended.  TTE revealed EF 40 to 45%, RWMA's of left ventricle, severe concentric LVH, trivial MR. Successful TEE with DCCV on 11/21. TEE revealed EF 50-55%, severe LVH.   Pre-procedure TEE prior to ablation 07/2022 showed EF 30-35%, mild MR. Pt had shortness of breath, admitted for further observation, given  IV lasix  and CXR showed cardiomegaly with interstitial edema, small effusion, CHF favored with LLL collapse/consolidation. SOB resolved and post-op bilateral groins oozed blood, eventually resolved. After PVI, converted to NSR on Amiodarone .   I last saw patient for follow-up on August 31, 2022. She was doing better since previous office visit. Denied any chest pain, shortness of breath, palpitations, syncope, presyncope, dizziness, orthopnea, PND, swelling or significant weight changes, acute bleeding, or claudication. Still felt fatigued. Denied any other issues.   Since I last saw patient she underwent A-fib ablation in June 2024.  Has been followed by EP and A-fib clinic.  Was noted to have some tremulousness and amiodarone  was stopped, slightly improved after stopping amiodarone .  Was to be evaluated by neurology at the time.  Hospitalized for chest pain in October 2024.  Was noted to have a UTI and hypokalemia, both were treated.  Echocardiogram revealed improved LVEF at 45 to 50%.  Troponins were reassuring.  05/17/2023 - Today she presents for scheduled follow-up and hospital follow-up.  She states she is doing well and denies any CP since leaving the hospital. Denies any chest pain, shortness of breath, palpitations, syncope, presyncope, dizziness, orthopnea, PND, swelling or significant weight changes, acute bleeding, or claudication.  She underwent cardioversion in February 2025, however patient returned back out of rhythm after cardioversion.  09/09/2023 -tells me she is now in persistent A-fib, tells me she is being followed closely by electrophysiology.  Saw electrophysiology APP on 08/23/2023.  Patient says Tikosyn was discussed however she said Dr. Norton said this would  not be a good medication for her.  Was mentioned at this visit and she may be a candidate for pulsed field ablation. Denies any chest pain, shortness of breath, palpitations, syncope, presyncope, dizziness, orthopnea, PND,  swelling or significant weight changes, acute bleeding, or claudication.  She underwent ablation on 12/22/2023. Tolerated procedure well.   01/11/2024 - Here for 4-6 month follow-up. Doing well since ablation. Does state SBPs at home average 160's. Denies any chest pain, shortness of breath, palpitations, syncope, presyncope, dizziness, orthopnea, PND, swelling or significant weight changes, acute bleeding, or claudication.  07/11/2024 - She is here for follow-up. Does not understand why her BP is more elevated today, recently BP was 138/60. Does admit to recent stress, family member passed away recently. BP today in office is 178/85. Has seen Dr. Rachele recently with progression in CKD. Most recent sCr was 1.35 with eGFR 40. Wants to know if she can start Jardiance. Was recently prescribed this by Dr. Rachele. Denies any chest pain, shortness of breath, palpitations, syncope, presyncope, dizziness, orthopnea, PND, swelling or significant weight changes, acute bleeding, or claudication.  ROS:    Please see the history of present illness.    All other systems reviewed and are negative.  EKGs/Labs/Other Studies Reviewed:    The following studies were reviewed today:   EKG:  EKG is not ordered today.   TEE 12/2023:   1. Left atrial size was mild to moderately dilated. No left atrial/left  atrial appendage thrombus was detected.   2. Evidence of atrial level shunting detected by color flow Doppler.  Small left to right shunting likely due to previous atrial septostomy.   3. Left ventricular ejection fraction, by estimation, is 50 to 55%. The  left ventricle has low normal function. The left ventricle demonstrates  global hypokinesis.   4. The aortic valve is tricuspid. Aortic valve regurgitation is not  visualized.   5. Right ventricular systolic function is normal. The right ventricular  size is normal.   6. The mitral valve is normal in structure. Trivial mitral valve   regurgitation.  Echo 03/2023:  1. Left ventricular ejection fraction, by estimation, is 45 to 50%. The  left ventricle has mildly decreased function. The left ventricle  demonstrates global hypokinesis. The left ventricular internal cavity size was mildly dilated. There is mild concentric left ventricular hypertrophy. Left ventricular diastolic parameters are consistent with Grade II diastolic dysfunction (pseudonormalization).   2. Right ventricular systolic function is normal. The right ventricular  size is normal. Tricuspid regurgitation signal is inadequate for assessing PA pressure.   3. Left atrial size was moderately dilated.   4. The mitral valve is degenerative. Trivial mitral valve regurgitation.  Moderate mitral annular calcification.   5. The aortic valve is tricuspid. There is mild calcification of the  aortic valve. Aortic valve regurgitation is not visualized. Aortic valve  sclerosis/calcification is present, without any evidence of aortic  stenosis.   6. The inferior vena cava is normal in size with greater than 50%  respiratory variability, suggesting right atrial pressure of 3 mmHg.   Comparison(s): Prior images reviewed side by side. LVEF has improved, now in the range of 45-50%.  TEE on 05/12/2022: 1. Left ventricular ejection fraction, by estimation, is 50 to 55%. The  left ventricle has low normal function. The left ventricle has no regional  wall motion abnormalities. There is severe concentric left ventricular  hypertrophy.   2. Right ventricular systolic function is normal. The right ventricular  size  is normal.   3. Left atrial size was mildly dilated. No left atrial/left atrial  appendage thrombus was detected. The LAA emptying velocity was 16 cm/s.   4. The mitral valve is normal in structure. Mild mitral valve  regurgitation. No evidence of mitral stenosis.   5. The aortic valve is tricuspid. Aortic valve regurgitation is not  visualized. Aortic valve  sclerosis/calcification is present, without any  evidence of aortic stenosis.   6. The inferior vena cava is normal in size with greater than 50%  respiratory variability, suggesting right atrial pressure of 3 mmHg.   Conclusion(s)/Recommendation(s): Normal biventricular function without  evidence of hemodynamically significant valvular heart disease.   Left heart cath on 05/11/22:   Prox Cx to Mid Cx lesion is 10% stenosed.   1st Mrg lesion is 100% stenosed.   Ost LAD to Mid LAD lesion is 30% stenosed.   Mid LAD to Dist LAD lesion is 30% stenosed.   Ost RCA to Prox RCA lesion is 40% stenosed.   RPAV-1 lesion is 20% stenosed.   RPAV-2 lesion is 70% stenosed.   2nd RPL lesion is 70% stenosed.   The LAD is a moderate caliber vessel that courses to the apex. There appears to be diffuse mild to moderate disease in the entire LAD without a focal stenosis.  2. The Circumflex has a patent proximal stented segment. This vessel terminates into an obtuse marginal branch. The small caliber first obtuse marginal branch is chronically occluded.  3. The RCA is a large caliber dominant vessel with a patent stent in the posterolateral artery. The small caliber distal posterolateral artery beyond the stented segment has moderately severe disease at a bifurcation but the vessels appear too small for PCI.  4. Normal LVEDP   Recommendations: Medical management of CAD. Consider adding Imdur  or Ranexa  for angina in setting of diffuse small vessel disease.    2D Echo complete on 05/10/2022: 1. Severe hypokinesis of the anteroseptal wall with overall mild LV  dysfunction.   2. Left ventricular ejection fraction, by estimation, is 40 to 45%. The  left ventricle has mildly decreased function. The left ventricle  demonstrates regional wall motion abnormalities (see scoring  diagram/findings for description). There is severe  concentric left ventricular hypertrophy. Left ventricular diastolic  parameters are  indeterminate.   3. Right ventricular systolic function is normal. The right ventricular  size is normal.   4. The mitral valve is normal in structure. Trivial mitral valve  regurgitation. No evidence of mitral stenosis.   5. The aortic valve is tricuspid. Aortic valve regurgitation is not  visualized. Aortic valve sclerosis is present, with no evidence of aortic  valve stenosis.   6. The inferior vena cava is normal in size with greater than 50%  respiratory variability, suggesting right atrial pressure of 3 mmHg.  Lexiscan  on 01/20/2018: Downsloping ST segment depression ST segment depression of 0.5 mm was noted during stress in the II, III, aVF, V5 and V6 leads. Defect 1: There is a small defect of mild severity present in the apical anterior and apical septal location. This likely represents soft tissue attenuation. A small degree of scar cannot entirely be excluded. No signficant ischemia. This is a low risk study. Nuclear stress EF: 64%.  Left heart cath on 08/02/2015: 1st RPLB lesion, 50% stenosed. Dist RCA lesion, 30% stenosed. Ost 1st Mrg to 1st Mrg lesion, 100% stenosed. Ost LAD lesion, 40% stenosed. Mid LAD lesion, 40% stenosed. Post Atrio lesion, 90% stenosed. Post  intervention, there is a 0% residual stenosis. The left ventricular systolic function is normal. Mid RCA lesion, 20% stenosed.   Normal LV function without focal segmental wall motion abnormalities and ejection fraction of 55%.   Significant coronary obstructive disease with diffuse mild luminal narrowing of the LAD with 40% proximal and mid stenoses; widely patent stent in the proximal circumflex with old occlusion of the marginal branch which had arisen in the region of the stented segment but with evidence for very faint collateralization to this distal marginal vessel from the the LAD; a large dominant RCA with 20%, mild mid narrowing, 30% narrowing after the acute margin and focal 90% stenosis distally prior to a  PDA 2 vessel with 50% narrowing at the ostium of this PDA prior to a bend in the vessel with the RCA ending in the PLA vessel.   Successful percutaneous cardiac intervention to the distal RCA treated with Angiosculpt scoring balloon, PTCA, and ultimate stenting with a 2.58 mm Xience Alpine DES stent postdilated to 2.51 mm with a 90% stenosis reduced to 0% and no change in the ostial PDA narrowing.   RECOMMENDATION: Since the patient suffered a non-ST segment elevation myocardial infarction on Plavix , Brilinta  was administered for oral antiplatelet therapy.  It was felt most likely the patient's atrial fibrillation was ischemic mediated.  If anticoagulation is necessary, Brilinta  will need to be discontinued and changed back to Plavix  due to potential bleed risk if  triple therapy is needed short-term.  Risk Assessment/Calculations:    CHA2DS2-VASc Score = 6  This indicates a 9.7% annual risk of stroke. The patient's score is based upon: CHF History: 1 HTN History: 1 Diabetes History: 0 Stroke History: 0 Vascular Disease History: 1 Age Score: 2 Gender Score: 1  Physical Exam:    VS:  BP (!) 161/72   Pulse 80   Ht 5' 3 (1.6 m)   Wt 134 lb (60.8 kg)   SpO2 98%   BMI 23.74 kg/m     Wt Readings from Last 3 Encounters:  07/11/24 134 lb (60.8 kg)  07/06/24 132 lb 3.2 oz (60 kg)  07/04/24 132 lb (59.9 kg)    GEN: Well nourished, well developed in no acute distress HEENT: Normal NECK: No JVD; No carotid bruits CARDIAC: S1/S2, RRR, no murmurs, rubs, gallops; 2+ pulses  RESPIRATORY:  Clear and diminished to auscultation without rales, wheezing or rhonchi  MUSCULOSKELETAL:  No edema; No deformity  SKIN: Thin skin, warm and dry NEUROLOGIC:  Alert and oriented x 3, improved tremors  PSYCHIATRIC:  Normal affect   ASSESSMENT & PLAN:    In order of problems listed above:   Persistent A-fib/A-flutter, s/p PVI ablation 07/2022, long term anticoagulation  Denies any palpitations or  tachycardia. CHA2DS2-VASc score 6. Continue current medication regimen.  Currently she is on appropriate dosage of Eliquis , denies any issues.  Discussed avoiding triggers to A-fib.  Continue rest of current medication regimen.  Medication therapy is limited due to her past history.  Continue to follow-up with up with A-fib clinic and EP.    CAD, s/p angioplasty to OM1 in 2016, DES to circumflex in 2016, and DES to RCA in 2017 Denies any CP. No indication for ischemic evaluation. Continue current meds. ED precautions discussed. Heart healthy diet and regular cardiovascular exercise encouraged.   3. PAD ABI's in 2015 revealed moderate depression of resting left ABI to 0.66, segmental analysis revealed pattern of tibial and potentially distal SFA/popliteal disease with digital disease also suspected;  no evidence of significant arterial occlusive disease in right lower extremity.  She denies any recent claudication symptoms.  Continue current medication regimen.  4. HFimpEF, atrial shunting Stage C, NYHA class I-II symptoms. TEE showed EF 50-55%. Evidence of atrial shunting (small left ot right shunting) likely d/t previous atrial septostomy. Denies any concerning signs or symptoms. Will continue to monitor. Euvolemic and well compensated on exam. Low sodium diet, fluid restriction <2L, and daily weights encouraged. Educated to contact our office for weight gain of 2 lbs overnight or 5 lbs in one week. Continue current medication regimen. Okay to begin Jardiance - Nephrology to manage. Continue rest of medication regimen. Heart healthy diet and regular cardiovascular exercise encouraged.   5. Hypertension Blood pressure elevated and not at goal. Does admit to recent stress. Continue rest of medication regimen. Discussed to monitor BP at home at least 2 hours after medications and sitting for 5-10 minutes. Heart healthy diet and regular cardiovascular exercise encouraged. She will update us  with her BP  readings in 2-3 weeks.   6. SSS, s/p Pacemaker Most recent remote device check revealed normal device function. Continue to follow up with EP.   7. Orthostatic hypotension Denies any recent symptoms.  Continue current medication regimen.  Continue to follow with PCP.  8. OSA on CPAP Does report wearing CPAP. Continue to follow with PCP.  9.  Disposition: Care and ED precautions discussed. Follow-up with me or Dr. Debera in 6 months or sooner if anything changes.    Medication Adjustments/Labs and Tests Ordered: Current medicines are reviewed at length with the patient today.  Concerns regarding medicines are outlined above.  No orders of the defined types were placed in this encounter.  No orders of the defined types were placed in this encounter.   Patient Instructions  Medication Instructions:   Your physician recommends that you continue on your current medications as directed. Please refer to the Current Medication list given to you today.   Labwork: None today  Testing/Procedures: None today  Follow-Up: 6 months with E.Emari Demmer,NP  Any Other Special Instructions Will Be Listed Below (If Applicable).   Record daily BP 3 hours after taking medication. Keep the log for the next 2-3 weeks    Follow the [facility] pamphlet provided you.  If you need a refill on your cardiac medications before your next appointment, please call your pharmacy.    Signed, Almarie Crate, NP  "

## 2024-07-11 NOTE — Patient Instructions (Signed)
 Medication Instructions:   Your physician recommends that you continue on your current medications as directed. Please refer to the Current Medication list given to you today.   Labwork: None today  Testing/Procedures: None today  Follow-Up: 6 months with E.Peck,NP  Any Other Special Instructions Will Be Listed Below (If Applicable).   Record daily BP 3 hours after taking medication. Keep the log for the next 2-3 weeks    Follow the [facility] pamphlet provided you.  If you need a refill on your cardiac medications before your next appointment, please call your pharmacy.

## 2024-07-13 ENCOUNTER — Ambulatory Visit: Admitting: Nurse Practitioner

## 2024-07-27 LAB — OPHTHALMOLOGY REPORT-SCANNED

## 2024-07-28 ENCOUNTER — Other Ambulatory Visit (HOSPITAL_COMMUNITY): Payer: Self-pay | Admitting: Family Medicine

## 2024-07-28 DIAGNOSIS — Z0000 Encounter for general adult medical examination without abnormal findings: Secondary | ICD-10-CM

## 2024-08-24 ENCOUNTER — Ambulatory Visit: Admitting: Podiatry

## 2024-10-04 ENCOUNTER — Ambulatory Visit

## 2024-10-31 ENCOUNTER — Ambulatory Visit: Admitting: Internal Medicine

## 2025-01-03 ENCOUNTER — Ambulatory Visit

## 2025-04-04 ENCOUNTER — Ambulatory Visit

## 2025-07-10 ENCOUNTER — Ambulatory Visit: Admitting: Family Medicine
